# Patient Record
Sex: Female | Born: 1945
Health system: Southern US, Community
[De-identification: ages and names within clinical notes are randomized; demographics above are authoritative.]

## PROBLEM LIST (undated history)

## (undated) DIAGNOSIS — R0989 Other specified symptoms and signs involving the circulatory and respiratory systems: Secondary | ICD-10-CM

## (undated) DIAGNOSIS — R0602 Shortness of breath: Secondary | ICD-10-CM

## (undated) DIAGNOSIS — I499 Cardiac arrhythmia, unspecified: Secondary | ICD-10-CM

## (undated) DIAGNOSIS — R5383 Other fatigue: Secondary | ICD-10-CM

## (undated) DIAGNOSIS — K828 Other specified diseases of gallbladder: Secondary | ICD-10-CM

## (undated) DIAGNOSIS — K219 Gastro-esophageal reflux disease without esophagitis: Secondary | ICD-10-CM

## (undated) DIAGNOSIS — J45909 Unspecified asthma, uncomplicated: Secondary | ICD-10-CM

## (undated) DIAGNOSIS — F32A Depression, unspecified: Secondary | ICD-10-CM

## (undated) DIAGNOSIS — F329 Major depressive disorder, single episode, unspecified: Secondary | ICD-10-CM

## (undated) DIAGNOSIS — R5381 Other malaise: Secondary | ICD-10-CM

## (undated) DIAGNOSIS — R011 Cardiac murmur, unspecified: Secondary | ICD-10-CM

## (undated) DIAGNOSIS — M1990 Unspecified osteoarthritis, unspecified site: Secondary | ICD-10-CM

## (undated) DIAGNOSIS — K5792 Diverticulitis of intestine, part unspecified, without perforation or abscess without bleeding: Secondary | ICD-10-CM

## (undated) DIAGNOSIS — R55 Syncope and collapse: Secondary | ICD-10-CM

## (undated) DIAGNOSIS — D638 Anemia in other chronic diseases classified elsewhere: Secondary | ICD-10-CM

## (undated) DIAGNOSIS — K649 Unspecified hemorrhoids: Secondary | ICD-10-CM

## (undated) DIAGNOSIS — I480 Paroxysmal atrial fibrillation: Secondary | ICD-10-CM

## (undated) DIAGNOSIS — I4891 Unspecified atrial fibrillation: Secondary | ICD-10-CM

## (undated) DIAGNOSIS — K269 Duodenal ulcer, unspecified as acute or chronic, without hemorrhage or perforation: Secondary | ICD-10-CM

## (undated) DIAGNOSIS — I10 Essential (primary) hypertension: Secondary | ICD-10-CM

## (undated) DIAGNOSIS — G4730 Sleep apnea, unspecified: Secondary | ICD-10-CM

## (undated) DIAGNOSIS — Z9289 Personal history of other medical treatment: Secondary | ICD-10-CM

## (undated) DIAGNOSIS — N289 Disorder of kidney and ureter, unspecified: Secondary | ICD-10-CM

## (undated) DIAGNOSIS — K922 Gastrointestinal hemorrhage, unspecified: Secondary | ICD-10-CM

## (undated) DIAGNOSIS — T8859XA Other complications of anesthesia, initial encounter: Secondary | ICD-10-CM

## (undated) DIAGNOSIS — N186 End stage renal disease: Secondary | ICD-10-CM

## (undated) DIAGNOSIS — D126 Benign neoplasm of colon, unspecified: Secondary | ICD-10-CM

## (undated) DIAGNOSIS — R519 Headache, unspecified: Secondary | ICD-10-CM

## (undated) DIAGNOSIS — T4145XA Adverse effect of unspecified anesthetic, initial encounter: Secondary | ICD-10-CM

## (undated) DIAGNOSIS — Q2730 Arteriovenous malformation, site unspecified: Secondary | ICD-10-CM

## (undated) DIAGNOSIS — I951 Orthostatic hypotension: Secondary | ICD-10-CM

## (undated) HISTORY — PX: KNEE ARTHROPLASTY: SHX992

## (undated) HISTORY — DX: Syncope and collapse: R55

## (undated) HISTORY — DX: Arteriovenous malformation, site unspecified: Q27.30

## (undated) HISTORY — DX: Other specified symptoms and signs involving the circulatory and respiratory systems: R09.89

## (undated) HISTORY — PX: TUBAL LIGATION: SHX77

## (undated) HISTORY — DX: Paroxysmal atrial fibrillation: I48.0

## (undated) HISTORY — DX: Other malaise: R53.83

## (undated) HISTORY — DX: Benign neoplasm of colon, unspecified: D12.6

## (undated) HISTORY — DX: Shortness of breath: R06.02

## (undated) HISTORY — PX: ESOPHAGOGASTRODUODENOSCOPY: SHX1529

## (undated) HISTORY — DX: Unspecified hemorrhoids: K64.9

## (undated) HISTORY — PX: AV FISTULA PLACEMENT: SHX1204

## (undated) HISTORY — DX: Orthostatic hypotension: I95.1

## (undated) HISTORY — DX: Other malaise: R53.81

## (undated) HISTORY — DX: Diverticulitis of intestine, part unspecified, without perforation or abscess without bleeding: K57.92

## (undated) HISTORY — DX: Other specified diseases of gallbladder: K82.8

## (undated) HISTORY — PX: COLONOSCOPY: SHX174

## (undated) HISTORY — DX: Anemia in other chronic diseases classified elsewhere: D63.8

## (undated) HISTORY — DX: Cardiac murmur, unspecified: R01.1

## (undated) HISTORY — PX: BACK SURGERY: SHX140

## (undated) HISTORY — DX: End stage renal disease: N18.6

## (undated) HISTORY — DX: Gastrointestinal hemorrhage, unspecified: K92.2

## (undated) HISTORY — DX: Duodenal ulcer, unspecified as acute or chronic, without hemorrhage or perforation: K26.9

---

## 2012-06-30 ENCOUNTER — Encounter (HOSPITAL_COMMUNITY): Payer: Self-pay | Admitting: Emergency Medicine

## 2012-06-30 ENCOUNTER — Inpatient Hospital Stay (HOSPITAL_COMMUNITY)
Admission: EM | Admit: 2012-06-30 | Discharge: 2012-07-02 | DRG: 378 | Disposition: A | Discharge status: Home / Self Care | Payer: Medicare Other | Attending: Internal Medicine | Admitting: Internal Medicine

## 2012-06-30 DIAGNOSIS — N039 Chronic nephritic syndrome with unspecified morphologic changes: Secondary | ICD-10-CM | POA: Diagnosis present

## 2012-06-30 DIAGNOSIS — N186 End stage renal disease: Secondary | ICD-10-CM | POA: Diagnosis present

## 2012-06-30 DIAGNOSIS — I4891 Unspecified atrial fibrillation: Secondary | ICD-10-CM | POA: Diagnosis present

## 2012-06-30 DIAGNOSIS — Z86010 Personal history of colonic polyps: Secondary | ICD-10-CM

## 2012-06-30 DIAGNOSIS — I129 Hypertensive chronic kidney disease with stage 1 through stage 4 chronic kidney disease, or unspecified chronic kidney disease: Secondary | ICD-10-CM | POA: Diagnosis present

## 2012-06-30 DIAGNOSIS — K921 Melena: Principal | ICD-10-CM | POA: Diagnosis present

## 2012-06-30 DIAGNOSIS — D62 Acute posthemorrhagic anemia: Secondary | ICD-10-CM

## 2012-06-30 DIAGNOSIS — N184 Chronic kidney disease, stage 4 (severe): Secondary | ICD-10-CM | POA: Diagnosis present

## 2012-06-30 DIAGNOSIS — D631 Anemia in chronic kidney disease: Secondary | ICD-10-CM | POA: Diagnosis present

## 2012-06-30 DIAGNOSIS — Z992 Dependence on renal dialysis: Secondary | ICD-10-CM | POA: Diagnosis present

## 2012-06-30 DIAGNOSIS — T45515A Adverse effect of anticoagulants, initial encounter: Secondary | ICD-10-CM | POA: Diagnosis present

## 2012-06-30 DIAGNOSIS — K922 Gastrointestinal hemorrhage, unspecified: Secondary | ICD-10-CM

## 2012-06-30 DIAGNOSIS — N390 Urinary tract infection, site not specified: Secondary | ICD-10-CM | POA: Diagnosis present

## 2012-06-30 DIAGNOSIS — I480 Paroxysmal atrial fibrillation: Secondary | ICD-10-CM | POA: Diagnosis present

## 2012-06-30 HISTORY — DX: Disorder of kidney and ureter, unspecified: N28.9

## 2012-06-30 HISTORY — DX: Unspecified atrial fibrillation: I48.91

## 2012-06-30 LAB — COMPREHENSIVE METABOLIC PANEL
ALT: 9 U/L (ref 0–35)
BUN: 59 mg/dL — ABNORMAL HIGH (ref 6–23)
CO2: 23 mEq/L (ref 19–32)
Calcium: 9 mg/dL (ref 8.4–10.5)
Creatinine, Ser: 4.65 mg/dL — ABNORMAL HIGH (ref 0.50–1.10)
GFR calc Af Amer: 10 mL/min — ABNORMAL LOW (ref >90)
GFR calc non Af Amer: 9 mL/min — ABNORMAL LOW (ref >90)
Glucose, Bld: 126 mg/dL — ABNORMAL HIGH (ref 70–99)
Sodium: 140 mEq/L (ref 135–145)

## 2012-06-30 LAB — URINALYSIS, ROUTINE W REFLEX MICROSCOPIC
Bilirubin Urine: NEGATIVE
Nitrite: NEGATIVE
Specific Gravity, Urine: 1.015 (ref 1.005–1.030)
Urobilinogen, UA: 0.2 mg/dL (ref 0.0–1.0)
pH: 5.5 (ref 5.0–8.0)

## 2012-06-30 LAB — CBC
HCT: 29.8 % — ABNORMAL LOW (ref 36.0–46.0)
Hemoglobin: 9.4 g/dL — ABNORMAL LOW (ref 12.0–15.0)
MCH: 28.1 pg (ref 26.0–34.0)
MCV: 89.2 fL (ref 78.0–100.0)
RBC: 3.34 MIL/uL — ABNORMAL LOW (ref 3.87–5.11)

## 2012-06-30 LAB — URINE MICROSCOPIC-ADD ON

## 2012-06-30 LAB — TYPE AND SCREEN
ABO/RH(D): O POS
Antibody Screen: NEGATIVE

## 2012-06-30 LAB — PROTIME-INR: Prothrombin Time: 43.3 seconds — ABNORMAL HIGH (ref 11.6–15.2)

## 2012-06-30 MED ORDER — DEXTROSE 5 % IV SOLN
1.0000 g | INTRAVENOUS | Status: DC
  Administered 2012-06-30: 1 g via INTRAVENOUS
  Filled 2012-06-30: qty 10

## 2012-06-30 NOTE — ED Notes (Signed)
Pt c/o blood in stool. Pt is on coumadin. Pt states "my bladder hurts when I make water." Pt states she has bright red blood in her stool off and on. Pt states symptoms started over the weekend with blood in her stool. Pt states she has had problems with her bladder for the last few months. Pt denies burning when urinating. Pt denies pain. Pt a/o x 3.

## 2012-06-30 NOTE — H&P (Addendum)
[facility] History and Physical  Jane [name] [id number] DOB: [birth year] DOA: 06/30/2012  Referring physician: Dr. Zenia Resides PCP: No primary provider on file.  Specialists: none  Chief Complaint: BRBPR  HPI: Jane [name] is a 66 y.o. female  With past medical history of chronic kidney disease stage IV, anemia with a hemoglobin around 9, atrial fibrillation on chronic Coumadin, hypertension that comes in for bright red blood per rectum 4 days prior to admission. She relates she's had a bloody bowel movement daily about 2-3 teaspoons of blood with her stools. She did not feel chest pain, shortness of breath, palpitation, leg pains, change in vision, no weakness, no lightheadedness. But symptoms were not improving she decided to come to the emergency room. Last colonoscopy was 4 years prior to this admission she and multiple polyps at this time. Mild dysuria with urination this started 3 days prior to admission.  Review of Systems: The patient denies anorexia, fever, weight loss,, vision loss, decreased hearing, hoarseness, chest pain, syncope, dyspnea on exertion, peripheral edema, balance deficits, hemoptysis, abdominal pain, melena, hematochezia, severe indigestion/heartburn, hematuria, incontinence, genital sores, muscle weakness, suspicious skin lesions, transient blindness, difficulty walking, depression, unusual weight change, abnormal bleeding, enlarged lymph nodes, angioedema, and breast masses.    Past Medical History  Diagnosis Date  . Atrial fibrillation   . Renal disorder    Past Surgical History  Procedure Date  . Knee arthroplasty   . Tubal ligation    Social History:  reports that she has never smoked. She does not have any smokeless tobacco history on file. She reports that she does not drink alcohol or use illicit drugs. She lives in [place] by herself can perform all her ADLs  No Known Allergies  Family History  Problem Relation Age of Onset  . Heart failure  Mother   . Stroke Mother   . Other Father     Prior to Admission medications   Medication Sig Start Date End Date Taking? Authorizing Provider  amiodarone (PACERONE) 200 MG tablet Take 200 mg by mouth daily.   Yes Historical Provider, MD  carvedilol (COREG) 25 MG tablet Take 50 mg by mouth 2 (two) times daily with a meal.   Yes Historical Provider, MD  cholecalciferol (VITAMIN D) 1000 UNITS tablet Take 1,000 Units by mouth daily.   Yes Historical Provider, MD  co-enzyme Q-10 30 MG capsule Take 100 mg by mouth daily.   Yes Historical Provider, MD  colchicine 0.6 MG tablet Take 0.6 mg by mouth daily as needed. gout   Yes Historical Provider, MD  ferrous sulfate 325 (65 FE) MG tablet Take 325 mg by mouth 2 (two) times daily.   Yes Historical Provider, MD  furosemide (LASIX) 20 MG tablet Take 20 mg by mouth 2 (two) times daily.   Yes Historical Provider, MD  pantoprazole (PROTONIX) 40 MG tablet Take 40 mg by mouth daily.   Yes Historical Provider, MD  spironolactone (ALDACTONE) 25 MG tablet Take 12.5 mg by mouth 2 (two) times daily.   Yes Historical Provider, MD  thiamine (VITAMIN B-1) 100 MG tablet Take 100 mg by mouth daily.   Yes Historical Provider, MD  vitamin C (ASCORBIC ACID) 500 MG tablet Take 500 mg by mouth daily.   Yes Historical Provider, MD  warfarin (COUMADIN) 5 MG tablet Take 5 mg by mouth daily.   Yes Historical Provider, MD   Physical Exam: Filed Vitals:   06/30/12 2215 06/30/12 2257 06/30/12 2302 06/30/12 2305  BP:  161/50  172/57 166/60  Pulse: 66 72 65 60  Temp:      TempSrc:      Resp: 17     SpO2: 98%        General:  Awake alert and oriented x3 in no acute distress  Eyes: Anicteric no pallor  ENT: Moist mucous membranes  Neck: No JVD  Cardiovascular: Irregular rate and rhythm with positive S1 and S2 no appreciated murmurs rubs gallops  Respiratory: Good air movement clear to auscultation  Abdomen: Positive bowel sounds nontender nondistended and  soft  Skin: No rashes ulcerations  Musculoskeletal: Intact  Psychiatric: Appropriate  Neurologic: Nonfocal  Labs on Admission:  Basic Metabolic Panel:  Lab [id number] 2130  NA 140  K 4.5  CL 105  CO2 23  GLUCOSE 126*  BUN 59*  CREATININE 4.65*  CALCIUM 9.0  MG --  PHOS --   Liver Function Tests:  Lab 06/30/12 2130  AST 13  ALT 9  ALKPHOS 123*  BILITOT 0.2*  PROT 7.5  ALBUMIN 3.4*   No results found for this basename: LIPASE:5,AMYLASE:5 in the last 168 hours No results found for this basename: AMMONIA:5 in the last 168 hours CBC:  Lab 06/30/12 2130  WBC 5.5  NEUTROABS --  HGB 9.4*  HCT 29.8*  MCV 89.2  PLT 191   Cardiac Enzymes: No results found for this basename: CKTOTAL:5,CKMB:5,CKMBINDEX:5,TROPONINI:5 in the last 168 hours  BNP (last 3 results) No results found for this basename: PROBNP:3 in the last 8760 hours CBG: No results found for this basename: GLUCAP:5 in the last 168 hours  Radiological Exams on Admission: No results found.  EKG: Sinus rhythm with a first degree AV block left axis deviation no T wave inversion  Assessment/Plan Principal Problem: Acute lower GI bleeding: - This most likely secondary to a supratherapeutic INR, she does not have a history of diverticulosis, she had a colonoscopy 4 years prior to admission at that time she had multiple polyps removed. She's not having any tenesmus. She relates she does have hemorrhoids and has been using Preparation H. I will continue to check her CBC every 12 hours, will type and screen and if patient starts feeling symptomatic her hemoglobin drops below 7 we'll go ahead and transfuse. She does not seem dehydrated by physical exam, she does not describe any symptoms of orthostasis or weakness.  - She's not actively bleeding at this time, we'll left her INR drift down. If she continues to have further episodes of bleeding or her hemoglobin continues to drop we'll have to reverse. Her CHADS score  is between 3 and 4. Will also have to call GI if her she continues to have ongoing bleeding.  - Place 2 large bore IV lines, we'll give Lasix after she gets E. unit of blood that she does have chronic kidney disease stage IV.  - She continues to have further episodes of bleeding we'll consult GI. We'll put her on a clear liquid diet and hold her Coumadin.  - INR to be followed by pharmacy  UTI: - U/A showed 21-50 white blood cells, many bacteria, she is complaining of some mild dysuria with urination. I agree with continuing Rocephin, urine cultures were sent. This might affect her INR drifting down. We'll check an INR tomorrow and give her vitamin K. Orally.  Chronic kidney disease, stage IV (severe) -At baseline as per patient, we'll continue her Lasix as prolactin check a basic metabolic panel in the morning. IV fluids and saline lock.  Atrial fibrillation - Currently in sinus rhythm, and rate control. Will hold her Coumadin. We'll leave her INR drift down, closer to 2. If she continues to have further episodes of bleeding we'll have to reverse.  Hypertension: - Good control continue current home meds. No changes were made.   Code Status: Full code Family Communication: daughter Disposition Plan: home in 2-3 days  Time spent: 54 mintes  FELIZ Marguarite Arbour [facility] Pager [phone]  If 7PM-7AM, please contact night-coverage [email or web address] Password [facility] 06/30/2012, 11:34 PM

## 2012-06-30 NOTE — ED Provider Notes (Signed)
History     CSN: [id number]  Arrival date & time 06/30/12  2043   First MD Initiated Contact with Patient 06/30/12 2147      Chief Complaint  Patient presents with  . Rectal Bleeding    (Consider location/radiation/quality/duration/timing/severity/associated sxs/prior treatment) Patient is a 66 y.o. female presenting with hematochezia. The history is provided by the patient.  Rectal Bleeding    patient here complaining of blood mixed in her stool x2 days. Denies any syncope or near-syncope. She is on Coumadin due to her A. fib. Denies any fever or vomiting. Patient also has a history of stage IV kidney disease being treated by her Dr. Gerald Dexter. She denies been lightheaded or dizzy. Nothing makes her symptoms better worse and no treatment used prior to arrival. Denies any bruising at this time her skin.  Past Medical History  Diagnosis Date  . Atrial fibrillation   . Renal disorder     No past surgical history on file.  No family history on file.  History  Substance Use Topics  . Smoking status: Not on file  . Smokeless tobacco: Not on file  . Alcohol Use:     OB History    Grav Para Term Preterm Abortions TAB SAB Ect Mult Living                  Review of Systems  Gastrointestinal: Positive for hematochezia.  All other systems reviewed and are negative.    Allergies  Review of patient's allergies indicates no known allergies.  Home Medications   Current Outpatient Rx  Name  Route  Sig  Dispense  Refill  . AMIODARONE HCL 200 MG PO TABS   Oral   Take 200 mg by mouth daily.         Marland Kitchen CARVEDILOL 25 MG PO TABS   Oral   Take 50 mg by mouth 2 (two) times daily with a meal.         . VITAMIN D 1000 UNITS PO TABS   Oral   Take 1,000 Units by mouth daily.         Marland Kitchen COENZYME Q10 30 MG PO CAPS   Oral   Take 100 mg by mouth daily.         . COLCHICINE 0.6 MG PO TABS   Oral   Take 0.6 mg by mouth daily as needed. gout         . FERROUS  SULFATE 325 (65 FE) MG PO TABS   Oral   Take 325 mg by mouth 2 (two) times daily.         . FUROSEMIDE 20 MG PO TABS   Oral   Take 20 mg by mouth 2 (two) times daily.         Marland Kitchen PANTOPRAZOLE SODIUM 40 MG PO TBEC   Oral   Take 40 mg by mouth daily.         Marland Kitchen SPIRONOLACTONE 25 MG PO TABS   Oral   Take 12.5 mg by mouth 2 (two) times daily.         Marland Kitchen VITAMIN B-1 100 MG PO TABS   Oral   Take 100 mg by mouth daily.         Marland Kitchen VITAMIN C 500 MG PO TABS   Oral   Take 500 mg by mouth daily.         . WARFARIN SODIUM 5 MG PO TABS   Oral   Take 5 mg  by mouth daily.           BP 175/56  Pulse 63  Temp 97.9 F (36.6 C) (Oral)  Resp 16  SpO2 98%  Physical Exam  Nursing note and vitals reviewed. Constitutional: She is oriented to person, place, and time. She appears well-developed and well-nourished.  Non-toxic appearance. No distress.  HENT:  Head: Normocephalic and atraumatic.  Eyes: Conjunctivae normal, EOM and lids are normal. Pupils are equal, round, and reactive to light.  Neck: Normal range of motion. Neck supple. No tracheal deviation present. No mass present.  Cardiovascular: Normal rate, regular rhythm and normal heart sounds.  Exam reveals no gallop.   No murmur heard. Pulmonary/Chest: Effort normal and breath sounds normal. No stridor. No respiratory distress. She has no decreased breath sounds. She has no wheezes. She has no rhonchi. She has no rales.  Abdominal: Soft. Normal appearance and bowel sounds are normal. She exhibits no distension. There is no tenderness. There is no rigidity, no rebound, no guarding and no CVA tenderness.  Genitourinary:       Green stool appreciated on rectal exam no blood or hemorrhage noted  Musculoskeletal: Normal range of motion. She exhibits no edema and no tenderness.  Neurological: She is alert and oriented to person, place, and time. She has normal strength. No cranial nerve deficit or sensory deficit. GCS eye subscore  is 4. GCS verbal subscore is 5. GCS motor subscore is 6.  Skin: Skin is warm and dry. No abrasion and no rash noted.  Psychiatric: She has a normal mood and affect. Her speech is normal and behavior is normal.    ED Course  Procedures (including critical care time)   Labs Reviewed  CBC  COMPREHENSIVE METABOLIC PANEL  PROTIME-INR  URINALYSIS, ROUTINE W REFLEX MICROSCOPIC  TYPE AND SCREEN  URINE CULTURE   No results found.   No diagnosis found.    MDM  Patient does have known stage IV kidney disease and her creatinine today is 4.65. She is unsure of her baseline is. Her INR is 5 and this is abnormal for her. She does have a UTI. The plan is for her to be observed overnight they'll contact the hospitalist        Leota Jacobsen, MD 06/30/12 2259

## 2012-06-30 NOTE — ED Notes (Signed)
MD at bedside. 

## 2012-07-01 LAB — CBC
HCT: 27.4 % — ABNORMAL LOW (ref 36.0–46.0)
HCT: 28.6 % — ABNORMAL LOW (ref 36.0–46.0)
HCT: 28 % — ABNORMAL LOW (ref 36.0–46.0)
Hemoglobin: 9.1 g/dL — ABNORMAL LOW (ref 12.0–15.0)
MCH: 28.3 pg (ref 26.0–34.0)
MCHC: 32.5 g/dL (ref 30.0–36.0)
MCV: 89 fL (ref 78.0–100.0)
MCV: 88 fL (ref 78.0–100.0)
RBC: 3.08 MIL/uL — ABNORMAL LOW (ref 3.87–5.11)
RBC: 3.25 MIL/uL — ABNORMAL LOW (ref 3.87–5.11)
RDW: 14.8 % (ref 11.5–15.5)
WBC: 6.3 10*3/uL (ref 4.0–10.5)
WBC: 4.8 10*3/uL (ref 4.0–10.5)

## 2012-07-01 LAB — COMPREHENSIVE METABOLIC PANEL
AST: 11 U/L (ref 0–37)
Albumin: 3.1 g/dL — ABNORMAL LOW (ref 3.5–5.2)
Alkaline Phosphatase: 112 U/L (ref 39–117)
BUN: 57 mg/dL — ABNORMAL HIGH (ref 6–23)
Potassium: 4.2 mEq/L (ref 3.5–5.1)
Total Protein: 6.8 g/dL (ref 6.0–8.3)

## 2012-07-01 MED ORDER — ONDANSETRON HCL 4 MG/2ML IJ SOLN: 4.0000 mg | Freq: Four times a day (QID) | INTRAMUSCULAR | Status: DC | PRN

## 2012-07-01 MED ORDER — PHYTONADIONE 1 MG/0.5 ML ORAL SOLUTION
1.0000 mg | Freq: Once | ORAL | Status: AC
  Administered 2012-07-01: 1 mg via ORAL
  Filled 2012-07-01: qty 0.5

## 2012-07-01 MED ORDER — ACETAMINOPHEN 325 MG PO TABS: 650.0000 mg | ORAL_TABLET | Freq: Four times a day (QID) | ORAL | Status: DC | PRN

## 2012-07-01 MED ORDER — SODIUM CHLORIDE 0.9 % IJ SOLN
3.0000 mL | Freq: Two times a day (BID) | INTRAMUSCULAR | Status: DC
  Administered 2012-07-01 – 2012-07-02 (×4): 3 mL via INTRAVENOUS

## 2012-07-01 MED ORDER — PANTOPRAZOLE SODIUM 40 MG PO TBEC
40.0000 mg | DELAYED_RELEASE_TABLET | Freq: Every day | ORAL | Status: DC
  Administered 2012-07-01 – 2012-07-02 (×2): 40 mg via ORAL
  Filled 2012-07-01 (×2): qty 1

## 2012-07-01 MED ORDER — VITAMIN C 500 MG PO TABS
500.0000 mg | ORAL_TABLET | Freq: Every day | ORAL | Status: DC
  Administered 2012-07-01 – 2012-07-02 (×2): 500 mg via ORAL
  Filled 2012-07-01 (×2): qty 1

## 2012-07-01 MED ORDER — SODIUM CHLORIDE 0.9 % IJ SOLN: 3.0000 mL | INTRAMUSCULAR | Status: DC | PRN

## 2012-07-01 MED ORDER — VITAMIN B-1 100 MG PO TABS
100.0000 mg | ORAL_TABLET | Freq: Every day | ORAL | Status: DC
  Administered 2012-07-01 – 2012-07-02 (×2): 100 mg via ORAL
  Filled 2012-07-01 (×2): qty 1

## 2012-07-01 MED ORDER — VITAMIN D3 25 MCG (1000 UNIT) PO TABS
1000.0000 [IU] | ORAL_TABLET | Freq: Every day | ORAL | Status: DC
  Administered 2012-07-01 – 2012-07-02 (×2): 1000 [IU] via ORAL
  Filled 2012-07-01 (×2): qty 1

## 2012-07-01 MED ORDER — CARVEDILOL 25 MG PO TABS
50.0000 mg | ORAL_TABLET | Freq: Two times a day (BID) | ORAL | Status: DC
  Administered 2012-07-01 – 2012-07-02 (×4): 50 mg via ORAL
  Filled 2012-07-01 (×6): qty 2

## 2012-07-01 MED ORDER — SODIUM CHLORIDE 0.9 % IV SOLN: 250.0000 mL | INTRAVENOUS | Status: DC | PRN

## 2012-07-01 MED ORDER — AMIODARONE HCL 200 MG PO TABS
200.0000 mg | ORAL_TABLET | Freq: Every day | ORAL | Status: DC
  Administered 2012-07-01 – 2012-07-02 (×2): 200 mg via ORAL
  Filled 2012-07-01 (×2): qty 1

## 2012-07-01 MED ORDER — CEFTRIAXONE SODIUM 1 G IJ SOLR
1.0000 g | Freq: Every day | INTRAMUSCULAR | Status: DC
  Administered 2012-07-01: 1 g via INTRAVENOUS
  Filled 2012-07-01 (×2): qty 10

## 2012-07-01 MED ORDER — FUROSEMIDE 20 MG PO TABS
20.0000 mg | ORAL_TABLET | Freq: Two times a day (BID) | ORAL | Status: DC
  Administered 2012-07-01 – 2012-07-02 (×4): 20 mg via ORAL
  Filled 2012-07-01 (×6): qty 1

## 2012-07-01 MED ORDER — ACETAMINOPHEN 650 MG RE SUPP: 650.0000 mg | Freq: Four times a day (QID) | RECTAL | Status: DC | PRN

## 2012-07-01 MED ORDER — ONDANSETRON HCL 4 MG PO TABS: 4.0000 mg | ORAL_TABLET | Freq: Four times a day (QID) | ORAL | Status: DC | PRN

## 2012-07-01 MED ORDER — SPIRONOLACTONE 12.5 MG HALF TABLET
12.5000 mg | ORAL_TABLET | Freq: Two times a day (BID) | ORAL | Status: DC
  Administered 2012-07-01 – 2012-07-02 (×3): 12.5 mg via ORAL
  Filled 2012-07-01 (×4): qty 1

## 2012-07-01 MED ORDER — POLYETHYLENE GLYCOL 3350 17 G PO PACK
17.0000 g | PACK | Freq: Every day | ORAL | Status: DC | PRN
  Filled 2012-07-01: qty 1

## 2012-07-01 MED ORDER — FERROUS SULFATE 325 (65 FE) MG PO TABS
325.0000 mg | ORAL_TABLET | Freq: Two times a day (BID) | ORAL | Status: DC
  Administered 2012-07-01 – 2012-07-02 (×4): 325 mg via ORAL
  Filled 2012-07-01 (×5): qty 1

## 2012-07-01 NOTE — Progress Notes (Addendum)
CRITICAL VALUE ALERT  Critical value received:  INR 5.02  Date of notification:  07/01/2012   Time of notification:  [id number]  Critical value read back: yes  Nurse who received alert:  Krista Blue, RN  MD notified (1st page):  Tylene Fantasia, NP  Time of first page:  [phone]  MD notified (2nd page):   Time of second page:  Responding MD:  Tylene Fantasia, NP  Time MD responded:  0502  No orders given to reverse INR. Admitting MD wanted to let INR trend down and monitor pt for bloody stools. Will continue to monitor.  At 0508: Tylene Fantasia placed orders to check CBC's at 1200 and 2000.

## 2012-07-01 NOTE — Progress Notes (Signed)
[facility] PROGRESS NOTE  Jane [name] [id number] DOB: Nov 08, 1945 DOA: 06/30/2012 PCP: No primary provider on file.  Assessment/Plan: 1.  Acute lower GI bleeding:  - This most likely secondary to a supratherapeutic INR, she does not have a history of diverticulosis, she had a colonoscopy 4 years prior to admission at that time she had multiple polyps removed. She's not having any tenesmus. She relates she does have hemorrhoids and has been using Preparation H. I will continue to check her CBC every 12 hours, will type and screen and if patient starts feeling symptomatic her hemoglobin drops below 7 we'll go ahead and transfuse. She does not seem dehydrated by physical exam, she does not describe any symptoms of orthostasis or weakness.  - She's not actively bleeding at this time, we'll left her INR drift down. If she continues to have further episodes of bleeding or her hemoglobin continues to drop we'll have to reverse. Her CHADS score is between 3 and 4.   - will call GI if her hemoglobin drops and she has continues active bleeding with INR TRENDING DOWN.  - Place 2 large bore IV lines, we'll give Lasix after she gets E. unit of blood that she does have chronic kidney disease stage IV.  - She continues to have further episodes of bleeding we'll consult GI. We'll put her on a clear liquid diet and hold her Coumadin.  - INR to be followed by pharmacy   UTI:  - U/A showed 21-50 white blood cells, many bacteria, she is complaining of some mild dysuria with urination. I agree with continuing Rocephin, urine cultures were sent. This might affect her INR drifting down. We'll check an INR tomorrow and give her vitamin K. Orally.  Chronic kidney disease, stage IV (severe)  -At baseline as per patient, we'll continue her Lasix as prolactin check a basic metabolic panel in the morning. IV fluids and saline lock.  Atrial fibrillation  - Currently in sinus rhythm, and rate control. Will hold her  Coumadin. We'll leave her INR drift down, closer to 2. If she continues to have further episodes of bleeding we'll have to reverse.  Hypertension:  - Good control continue current home meds. No changes were made.  Code Status: full code.  Family Communication: daughter at bedside Disposition Plan: possibly on saturday      HPI/Subjective: COMFORTABLE. IS anxious about going home.   Objective: Filed Vitals:   07/01/12 0147 07/01/12 0638 07/01/12 1449 07/01/12 1704  BP: 143/52 133/59 126/38 146/47  Pulse: 63 65 58 60  Temp: 97.6 F (36.4 C) 97.8 F (36.6 C) 97.7 F (36.5 C)   TempSrc: Oral Oral Oral   Resp: 20 20 18    Height:      Weight:      SpO2: 98% 97% 100%     Intake/Output Summary (Last 24 hours) at 07/01/12 1942 Last data filed at 07/01/12 1452  Gross per 24 hour  Intake    480 ml  Output   1001 ml  Net   -521 ml   Filed Weights   07/01/12 0120  Weight: 127 kg (279 lb 15.8 oz)    Exam:   General:  Alert afebrile comfortable  Cardiovascular: s1s2  Respiratory: CTAB  Abdomen: soft NT ND BS+  Data Reviewed: Basic Metabolic Panel:  Lab [id number] 0404 06/30/12 2130  NA 140 140  K 4.2 4.5  CL 105 105  CO2 24 23  GLUCOSE 107* 126*  BUN 57* 59*  CREATININE  4.50* 4.65*  CALCIUM 8.9 9.0  MG -- --  PHOS -- --   Liver Function Tests:  Lab 07/01/12 0404 06/30/12 2130  AST 11 13  ALT 8 9  ALKPHOS 112 123*  BILITOT 0.2* 0.2*  PROT 6.8 7.5  ALBUMIN 3.1* 3.4*   No results found for this basename: LIPASE:5,AMYLASE:5 in the last 168 hours No results found for this basename: AMMONIA:5 in the last 168 hours CBC:  Lab 07/01/12 1911 07/01/12 1118 07/01/12 0404 06/30/12 2130  WBC 5.2 4.8 6.3 5.5  NEUTROABS -- -- -- --  HGB 9.1* 9.4* 8.6* 9.4*  HCT 28.0* 28.6* 27.4* 29.8*  MCV 87.2 88.0 89.0 89.2  PLT 192 199 185 191   Cardiac Enzymes: No results found for this basename: CKTOTAL:5,CKMB:5,CKMBINDEX:5,TROPONINI:5 in the last 168 hours BNP (last 3  results) No results found for this basename: PROBNP:3 in the last 8760 hours CBG: No results found for this basename: GLUCAP:5 in the last 168 hours  No results found for this or any previous visit (from the past 240 hour(s)).   Studies: No results found.  Scheduled Meds:   . amiodarone  200 mg Oral Daily  . carvedilol  50 mg Oral BID WC  . cefTRIAXone (ROCEPHIN)  IV  1 g Intravenous QHS  . cholecalciferol  1,000 Units Oral Daily  . ferrous sulfate  325 mg Oral BID  . furosemide  20 mg Oral BID  . pantoprazole  40 mg Oral Daily  . sodium chloride  3 mL Intravenous Q12H  . spironolactone  12.5 mg Oral BID  . thiamine  100 mg Oral Daily  . vitamin C  500 mg Oral Daily   Continuous Infusions:   Principal Problem:  *Acute lower GI bleeding Active Problems:  Chronic kidney disease, stage IV (severe)  Atrial fibrillation  Hypertension  UTI (lower urinary tract infection)        [name]  [facility] Pager [phone] If 8PM-8AM, please contact night-coverage at [email or web address], password [facility] 07/01/2012, 7:42 PM  LOS: 1 day

## 2012-07-02 DIAGNOSIS — D62 Acute posthemorrhagic anemia: Secondary | ICD-10-CM

## 2012-07-02 DIAGNOSIS — Z86010 Personal history of colonic polyps: Secondary | ICD-10-CM

## 2012-07-02 LAB — CBC
HCT: 28.3 % — ABNORMAL LOW (ref 36.0–46.0)
MCV: 88.2 fL (ref 78.0–100.0)
Platelets: 186 10*3/uL (ref 150–400)
RBC: 3.21 MIL/uL — ABNORMAL LOW (ref 3.87–5.11)
RDW: 14.4 % (ref 11.5–15.5)
WBC: 4.5 10*3/uL (ref 4.0–10.5)

## 2012-07-02 LAB — URINE CULTURE

## 2012-07-02 LAB — HEMOGLOBIN AND HEMATOCRIT, BLOOD: HCT: 33.2 % — ABNORMAL LOW (ref 36.0–46.0)

## 2012-07-02 MED ORDER — CEPHALEXIN 500 MG PO CAPS: 500.0000 mg | ORAL_CAPSULE | Freq: Two times a day (BID) | ORAL | Status: DC

## 2012-07-02 MED ORDER — WARFARIN SODIUM 5 MG PO TABS: 5.0000 mg | ORAL_TABLET | Freq: Every day | ORAL | Status: DC

## 2012-07-02 NOTE — Consult Note (Signed)
Referring Provider: No ref. provider found Primary Care Physician:  No primary provider on file. Primary Gastroenterologist:  Benna Dunks is from [place]  Reason for Consultation:  GIB  HPI: Jane [name] is a 66 y.o. female with past medical history of chronic kidney disease stage IV, anemia with a baseline hemoglobin around 9, atrial fibrillation on chronic Coumadin, and hypertension who comes in for bright red blood per rectum 4 days prior to admission. She is here visiting from [place].  She states that she's had a bloody bowel movement daily about 2-3 teaspoons of blood with her stools since last Saturday. She did not feel chest pain, shortness of breath, palpitation, leg pains, change in vision, no weakness, no lightheadedness, but did have some cramping discomfort as if she were to need to move her bowels.  Says that she was constipated for a few days prior to the bleeding.  Symptoms were not resolving so she came to the ED on 12/11.  INR was 5.02 and Hgb was 9.4 grams.  Her baseline Hgb is in the 9 gram range according to her report and she takes iron 325 mg BID at home.  Her INR was left to reverse and is down to 2.24 today.  Hgb has held stable and she has not required transfusion.  She has not seen any blood since yesterday AM.  Had a small bowel movement today without blood.  Last colonoscopy was 4 years prior to this admission she and multiple polyps at this time.  This was obviously performed in [place].  She does not recall being told that she had diverticulosis at that time.   Past Medical History  Diagnosis Date  . Atrial fibrillation   . Renal disorder     Past Surgical History  Procedure Date  . Knee arthroplasty   . Tubal ligation     Prior to Admission medications   Medication Sig Start Date End Date Taking? Authorizing Provider  amiodarone (PACERONE) 200 MG tablet Take 200 mg by mouth daily.   Yes Historical Provider, MD  carvedilol (COREG) 25 MG tablet  Take 50 mg by mouth 2 (two) times daily with a meal.   Yes Historical Provider, MD  cholecalciferol (VITAMIN D) 1000 UNITS tablet Take 1,000 Units by mouth daily.   Yes Historical Provider, MD  co-enzyme Q-10 30 MG capsule Take 100 mg by mouth daily.   Yes Historical Provider, MD  colchicine 0.6 MG tablet Take 0.6 mg by mouth daily as needed. gout   Yes Historical Provider, MD  ferrous sulfate 325 (65 FE) MG tablet Take 325 mg by mouth 2 (two) times daily.   Yes Historical Provider, MD  furosemide (LASIX) 20 MG tablet Take 20 mg by mouth 2 (two) times daily.   Yes Historical Provider, MD  pantoprazole (PROTONIX) 40 MG tablet Take 40 mg by mouth daily.   Yes Historical Provider, MD  spironolactone (ALDACTONE) 25 MG tablet Take 12.5 mg by mouth 2 (two) times daily.   Yes Historical Provider, MD  thiamine (VITAMIN B-1) 100 MG tablet Take 100 mg by mouth daily.   Yes Historical Provider, MD  vitamin C (ASCORBIC ACID) 500 MG tablet Take 500 mg by mouth daily.   Yes Historical Provider, MD  warfarin (COUMADIN) 5 MG tablet Take 5 mg by mouth daily.   Yes Historical Provider, MD    Current Facility-Administered Medications  Medication Dose Route Frequency Provider Last Rate Last Dose  . 0.9 %  sodium chloride infusion  250 mL  Intravenous PRN Charlynne Cousins, MD      . acetaminophen (TYLENOL) tablet 650 mg  650 mg Oral Q6H PRN Charlynne Cousins, MD       Or  . acetaminophen (TYLENOL) suppository 650 mg  650 mg Rectal Q6H PRN Charlynne Cousins, MD      . amiodarone (PACERONE) tablet 200 mg  200 mg Oral Daily Charlynne Cousins, MD   200 mg at 07/02/12 1125  . carvedilol (COREG) tablet 50 mg  50 mg Oral BID WC Charlynne Cousins, MD   50 mg at 07/02/12 0849  . cefTRIAXone (ROCEPHIN) 1 g in dextrose 5 % 50 mL IVPB  1 g Intravenous QHS Charlynne Cousins, MD   1 g at 07/01/12 2109  . cholecalciferol (VITAMIN D) tablet 1,000 Units  1,000 Units Oral Daily Charlynne Cousins, MD   1,000 Units at  07/02/12 1125  . ferrous sulfate tablet 325 mg  325 mg Oral BID Charlynne Cousins, MD   325 mg at 07/02/12 1125  . furosemide (LASIX) tablet 20 mg  20 mg Oral BID Charlynne Cousins, MD   20 mg at 07/02/12 0849  . ondansetron (ZOFRAN) tablet 4 mg  4 mg Oral Q6H PRN Charlynne Cousins, MD       Or  . ondansetron [facility]) injection 4 mg  4 mg Intravenous Q6H PRN Charlynne Cousins, MD      . pantoprazole (PROTONIX) EC tablet 40 mg  40 mg Oral Daily Charlynne Cousins, MD   40 mg at 07/02/12 1125  . polyethylene glycol (MIRALAX / GLYCOLAX) packet 17 g  17 g Oral Daily PRN Charlynne Cousins, MD      . sodium chloride 0.9 % injection 3 mL  3 mL Intravenous Q12H Charlynne Cousins, MD   3 mL at 07/02/12 1127  . sodium chloride 0.9 % injection 3 mL  3 mL Intravenous PRN Charlynne Cousins, MD      . spironolactone (ALDACTONE) tablet 12.5 mg  12.5 mg Oral BID Charlynne Cousins, MD   12.5 mg at 07/02/12 1125  . thiamine (VITAMIN B-1) tablet 100 mg  100 mg Oral Daily Charlynne Cousins, MD   100 mg at 07/02/12 1125  . vitamin C (ASCORBIC ACID) tablet 500 mg  500 mg Oral Daily Charlynne Cousins, MD   500 mg at 07/02/12 1125    Allergies as of 06/30/2012  . (No Known Allergies)    Family History  Problem Relation Age of Onset  . Heart failure Mother   . Stroke Mother   . Other Father     History   Social History  . Marital Status: Single    Spouse Name: N/A    Number of Children: N/A  . Years of Education: N/A   Occupational History  . Not on file.   Social History Main Topics  . Smoking status: Never Smoker   . Smokeless tobacco: Not on file  . Alcohol Use: No  . Drug Use: No  . Sexually Active: No   Other Topics Concern  . Not on file   Social History Narrative  . No narrative on file    Review of Systems: Ten point ROS is O/W negative except as mentioned in HPI.  Physical Exam: Vital signs in last 24 hours: Temp:  [97.7 F (36.5 C)-98.2 F (36.8 C)]  98.2 F (36.8 C) (12/13 0543) Pulse Rate:  [58-66] 60  (12/13 0849)  Resp:  [18-20] 20  (12/13 0543) BP: (105-146)/(38-55) 132/55 mmHg (12/13 0849) SpO2:  [95 %-100 %] 95 % (12/13 0543) Last BM Date: 07/01/12 General:   Alert, Well-developed, well-nourished, pleasant and cooperative in NAD Head:  Normocephalic and atraumatic. Eyes:  Sclera clear, no icterus.  Conjunctiva pink. Ears:  Normal auditory acuity. Mouth:  No deformity or lesions.   Lungs:  Clear throughout to auscultation.  No wheezes, crackles, or rhonchi.  Heart:  Regular rate and rhythm; no murmurs, clicks, rubs,  or gallops. Abdomen:  Soft, obese, non-distended, nontender, BS active, nonpalp mass or hsm.  Low vertical scar noted on abdomen.   Rectal:  Deferred.  Msk:  Symmetrical without gross deformities. Pulses:  Normal pulses noted. Extremities:  Trace edema in B/L LE's. Neurologic:  Alert and  oriented x4;  grossly normal neurologically. Skin:  Intact without significant lesions or rashes.. Psych:  Alert and cooperative. Normal mood and affect.  Intake/Output from previous day: 12/12 0701 - 12/13 0700 In: 861 [P.O.:720; I.V.:141] Out: 2701 [Urine:2700; Stool:1]  Lab Results:  Basename 07/02/12 1005 07/01/12 1911 07/01/12 1118  WBC 4.5 5.2 4.8  HGB 9.1* 9.1* 9.4*  HCT 28.3* 28.0* 28.6*  PLT 186 192 199   BMET  Basename 07/01/12 0404 06/30/12 2130  NA 140 140  K 4.2 4.5  CL 105 105  CO2 24 23  GLUCOSE 107* 126*  BUN 57* 59*  CREATININE 4.50* 4.65*  CALCIUM 8.9 9.0   LFT  Basename 07/01/12 0404  PROT 6.8  ALBUMIN 3.1*  AST 11  ALT 8  ALKPHOS 112  BILITOT 0.2*  BILIDIR --  IBILI --   PT/INR  Basename 07/02/12 1005 07/01/12 1910  LABPROT 23.8* 28.8*  INR 2.24* 2.90*    IMPRESSION:  -GIB:  Likely lower in origin.  Possibly diverticular vs hemorrhoidal.  This occurred in the setting of supratherapeutic INR.  Bleeding seems to have stopped. -Anemia, normocytic:  Chronic secondary to kidney  disease.  Hgb has been stable. -CKD stage IV -Atrial fibrillation:  On chronic coumadin. -Supratherapeutic INR:  Drifting down well.   PLAN: -Monitor Hgb and transfuse if needed. -Likely will plan to observe on diet.  Has been modified to low fiber/low residue for now. -Will need follow-up with her GI in [place] when she returns.   ZEHR, JESSICA D.  07/02/2012, 1:33 PM  Pager number [phone]  GI ATTENDING  History, chart, laboratories reviewed. History and physical exam as above. Case discussed with physician assistant. Patient with hemodynamically stable lower GI bleeding, on Coumadin. Most consistent with benign anorectal pathology. Bleeding has ceased. Normal bowel movements today. The primary team is planning on sending her home. She's anticipating air travel back to [place] and has followup with her GI doctor on Monday. We're available as needed. Thanks  Docia Chuck. Geri Seminole., M.D. [facility] Division of Gastroenterology

## 2012-07-18 NOTE — Discharge Summary (Signed)
Physician Discharge Summary  Jane [name] [id number] DOB: 1945-09-26 DOA: 06/30/2012  PCP: No primary provider on file.  Admit date: 06/30/2012 Discharge date: 07/18/2012    Recommendations for Outpatient Follow-up:  1. Follow up with GI tomorrow  Discharge Diagnoses:  Principal Problem:  *Acute lower GI bleeding Active Problems:  Chronic kidney disease, stage IV (severe)  Atrial fibrillation  Hypertension  UTI (lower urinary tract infection)   Discharge Condition: stable  Diet recommendation: RENAL AND LOW SODIUM DIET  Filed Weights   07/01/12 0120  Weight: 127 kg (279 lb 15.8 oz)    History of present illness:   Jane [name] is a 66 y.o. female  With past medical history of chronic kidney disease stage IV, anemia with a hemoglobin around 9, atrial fibrillation on chronic Coumadin, hypertension that comes in for bright red blood per rectum 4 days prior to admission. She relates she's had a bloody bowel movement daily about 2-3 teaspoons of blood with her stools. She did not feel chest pain, shortness of breath, palpitation, leg pains, change in vision, no weakness, no lightheadedness.  But symptoms were not improving she decided to come to the emergency room.  Last colonoscopy was 4 years prior to this admission she and multiple polyps at this time.  Mild dysuria with urination this started 3 days prior to admission.  Hospital Course:  [place] GI bleeding: stopped. - This most likely secondary to a supratherapeutic INR, she does not have a history of diverticulosis, she had a colonoscopy 4 years prior to admission at that time she had multiple polyps removed. She's not having any tenesmus. She relates she does have hemorrhoids and has been using Preparation H.- She's not actively bleeding at this time, we have let her INR drift down. Her INR on discharge is 2.24 - GI consulted recommending as she is not actively bleeding , can follow up with her GI in [place].  UTI:  -  U/A showed 21-50 white blood cells, many bacteria, she is complaining of some mild dysuria with urination. We have discharged her on an antibiotic to compelte the course.  Chronic kidney disease, stage IV (severe)  -At baseline as per patient, . Atrial fibrillation  - Currently in sinus rhythm, and rate control. INR supratherapeutic. Let it drift down and therapeutic on discharge.  Hypertension:  - Good control continue current home meds. No changes were made.       Consultations:  GI   Discharge Exam: Filed Vitals:   07/01/12 2226 07/02/12 0543 07/02/12 0849 07/02/12 1347  BP: 125/53 105/46 132/55 154/48  Pulse: 65 66 60 60  Temp: 97.7 F (36.5 C) 98.2 F (36.8 C)  98 F (36.7 C)  TempSrc: Oral Oral  Oral  Resp: 20 20  18   Height:      Weight:      SpO2: 100% 95%  100%   General: Alert afebrile comfortable  Cardiovascular: s1s2  Respiratory: CTAB  Abdomen: soft NT ND BS+    Discharge Instructions  Discharge Orders    Future Orders Please Complete By Expires   Diet general      Discharge instructions      Comments:   Follow up with GI as soon as possible.   Activity as tolerated - No restrictions          Medication List     As of 07/18/2012  6:16 PM    TAKE these medications         amiodarone 200 MG  tablet   Commonly known as: PACERONE   Take 200 mg by mouth daily.      carvedilol 25 MG tablet   Commonly known as: COREG   Take 50 mg by mouth 2 (two) times daily with a meal.      cephALEXin 500 MG capsule   Commonly known as: KEFLEX   Take 1 capsule (500 mg total) by mouth 2 (two) times daily.      cholecalciferol 1000 UNITS tablet   Commonly known as: VITAMIN D   Take 1,000 Units by mouth daily.      co-enzyme Q-10 30 MG capsule   Take 100 mg by mouth daily.      colchicine 0.6 MG tablet   Take 0.6 mg by mouth daily as needed. gout      ferrous sulfate 325 (65 FE) MG tablet   Take 325 mg by mouth 2 (two) times daily.      furosemide  20 MG tablet   Commonly known as: LASIX   Take 20 mg by mouth 2 (two) times daily.      pantoprazole 40 MG tablet   Commonly known as: PROTONIX   Take 40 mg by mouth daily.      spironolactone 25 MG tablet   Commonly known as: ALDACTONE   Take 12.5 mg by mouth 2 (two) times daily.      thiamine 100 MG tablet   Commonly known as: VITAMIN B-1   Take 100 mg by mouth daily.      vitamin C 500 MG tablet   Commonly known as: ASCORBIC ACID   Take 500 mg by mouth daily.      warfarin 5 MG tablet   Commonly known as: COUMADIN   Take 1 tablet (5 mg total) by mouth daily.          The results of significant diagnostics from this hospitalization (including imaging, microbiology, ancillary and laboratory) are listed below for reference.    Significant Diagnostic Studies: No results found.  Microbiology: No results found for this or any previous visit (from the past 240 hour(s)).   Labs: Basic Metabolic Panel: No results found for this basename: NA:5,K:5,CL:5,CO2:5,GLUCOSE:5,BUN:5,CREATININE:5,CALCIUM:5,MG:5,PHOS:5 in the last 168 hours Liver Function Tests: No results found for this basename: AST:5,ALT:5,ALKPHOS:5,BILITOT:5,PROT:5,ALBUMIN:5 in the last 168 hours No results found for this basename: LIPASE:5,AMYLASE:5 in the last 168 hours No results found for this basename: AMMONIA:5 in the last 168 hours CBC: No results found for this basename: WBC:5,NEUTROABS:5,HGB:5,HCT:5,MCV:5,PLT:5 in the last 168 hours Cardiac Enzymes: No results found for this basename: CKTOTAL:5,CKMB:5,CKMBINDEX:5,TROPONINI:5 in the last 168 hours BNP: BNP (last 3 results) No results found for this basename: PROBNP:3 in the last 8760 hours CBG: No results found for this basename: GLUCAP:5 in the last 168 hours     Signed:  Taos Tapp  [facility] 07/18/2012, 6:16 PM

## 2014-07-22 DIAGNOSIS — Z992 Dependence on renal dialysis: Secondary | ICD-10-CM | POA: Diagnosis not present

## 2014-07-22 DIAGNOSIS — N2581 Secondary hyperparathyroidism of renal origin: Secondary | ICD-10-CM | POA: Diagnosis not present

## 2014-07-22 DIAGNOSIS — N186 End stage renal disease: Secondary | ICD-10-CM | POA: Diagnosis not present

## 2014-07-22 DIAGNOSIS — D509 Iron deficiency anemia, unspecified: Secondary | ICD-10-CM | POA: Diagnosis not present

## 2014-07-25 DIAGNOSIS — Z992 Dependence on renal dialysis: Secondary | ICD-10-CM | POA: Diagnosis not present

## 2014-07-25 DIAGNOSIS — D509 Iron deficiency anemia, unspecified: Secondary | ICD-10-CM | POA: Diagnosis not present

## 2014-07-25 DIAGNOSIS — N2581 Secondary hyperparathyroidism of renal origin: Secondary | ICD-10-CM | POA: Diagnosis not present

## 2014-07-25 DIAGNOSIS — N186 End stage renal disease: Secondary | ICD-10-CM | POA: Diagnosis not present

## 2014-07-27 DIAGNOSIS — N186 End stage renal disease: Secondary | ICD-10-CM | POA: Diagnosis not present

## 2014-07-27 DIAGNOSIS — Z992 Dependence on renal dialysis: Secondary | ICD-10-CM | POA: Diagnosis not present

## 2014-07-27 DIAGNOSIS — D509 Iron deficiency anemia, unspecified: Secondary | ICD-10-CM | POA: Diagnosis not present

## 2014-07-27 DIAGNOSIS — N2581 Secondary hyperparathyroidism of renal origin: Secondary | ICD-10-CM | POA: Diagnosis not present

## 2014-07-29 DIAGNOSIS — N186 End stage renal disease: Secondary | ICD-10-CM | POA: Diagnosis not present

## 2014-07-29 DIAGNOSIS — N2581 Secondary hyperparathyroidism of renal origin: Secondary | ICD-10-CM | POA: Diagnosis not present

## 2014-07-29 DIAGNOSIS — Z992 Dependence on renal dialysis: Secondary | ICD-10-CM | POA: Diagnosis not present

## 2014-07-29 DIAGNOSIS — D509 Iron deficiency anemia, unspecified: Secondary | ICD-10-CM | POA: Diagnosis not present

## 2014-07-31 DIAGNOSIS — I4891 Unspecified atrial fibrillation: Secondary | ICD-10-CM | POA: Diagnosis not present

## 2014-08-01 DIAGNOSIS — Z992 Dependence on renal dialysis: Secondary | ICD-10-CM | POA: Diagnosis not present

## 2014-08-01 DIAGNOSIS — N186 End stage renal disease: Secondary | ICD-10-CM | POA: Diagnosis not present

## 2014-08-01 DIAGNOSIS — N2581 Secondary hyperparathyroidism of renal origin: Secondary | ICD-10-CM | POA: Diagnosis not present

## 2014-08-01 DIAGNOSIS — D509 Iron deficiency anemia, unspecified: Secondary | ICD-10-CM | POA: Diagnosis not present

## 2014-08-03 DIAGNOSIS — N2581 Secondary hyperparathyroidism of renal origin: Secondary | ICD-10-CM | POA: Diagnosis not present

## 2014-08-03 DIAGNOSIS — N186 End stage renal disease: Secondary | ICD-10-CM | POA: Diagnosis not present

## 2014-08-03 DIAGNOSIS — D509 Iron deficiency anemia, unspecified: Secondary | ICD-10-CM | POA: Diagnosis not present

## 2014-08-03 DIAGNOSIS — Z992 Dependence on renal dialysis: Secondary | ICD-10-CM | POA: Diagnosis not present

## 2014-08-05 DIAGNOSIS — D509 Iron deficiency anemia, unspecified: Secondary | ICD-10-CM | POA: Diagnosis not present

## 2014-08-05 DIAGNOSIS — N186 End stage renal disease: Secondary | ICD-10-CM | POA: Diagnosis not present

## 2014-08-05 DIAGNOSIS — Z992 Dependence on renal dialysis: Secondary | ICD-10-CM | POA: Diagnosis not present

## 2014-08-05 DIAGNOSIS — N2581 Secondary hyperparathyroidism of renal origin: Secondary | ICD-10-CM | POA: Diagnosis not present

## 2014-08-08 DIAGNOSIS — D509 Iron deficiency anemia, unspecified: Secondary | ICD-10-CM | POA: Diagnosis not present

## 2014-08-08 DIAGNOSIS — N186 End stage renal disease: Secondary | ICD-10-CM | POA: Diagnosis not present

## 2014-08-08 DIAGNOSIS — N2581 Secondary hyperparathyroidism of renal origin: Secondary | ICD-10-CM | POA: Diagnosis not present

## 2014-08-08 DIAGNOSIS — Z992 Dependence on renal dialysis: Secondary | ICD-10-CM | POA: Diagnosis not present

## 2014-08-10 DIAGNOSIS — Z992 Dependence on renal dialysis: Secondary | ICD-10-CM | POA: Diagnosis not present

## 2014-08-10 DIAGNOSIS — N186 End stage renal disease: Secondary | ICD-10-CM | POA: Diagnosis not present

## 2014-08-10 DIAGNOSIS — N2581 Secondary hyperparathyroidism of renal origin: Secondary | ICD-10-CM | POA: Diagnosis not present

## 2014-08-10 DIAGNOSIS — D509 Iron deficiency anemia, unspecified: Secondary | ICD-10-CM | POA: Diagnosis not present

## 2014-08-12 DIAGNOSIS — D509 Iron deficiency anemia, unspecified: Secondary | ICD-10-CM | POA: Diagnosis not present

## 2014-08-12 DIAGNOSIS — Z992 Dependence on renal dialysis: Secondary | ICD-10-CM | POA: Diagnosis not present

## 2014-08-12 DIAGNOSIS — N2581 Secondary hyperparathyroidism of renal origin: Secondary | ICD-10-CM | POA: Diagnosis not present

## 2014-08-12 DIAGNOSIS — N186 End stage renal disease: Secondary | ICD-10-CM | POA: Diagnosis not present

## 2014-08-15 DIAGNOSIS — M069 Rheumatoid arthritis, unspecified: Secondary | ICD-10-CM | POA: Diagnosis not present

## 2014-08-15 DIAGNOSIS — R6883 Chills (without fever): Secondary | ICD-10-CM | POA: Diagnosis not present

## 2014-08-15 DIAGNOSIS — R0981 Nasal congestion: Secondary | ICD-10-CM | POA: Diagnosis not present

## 2014-08-15 DIAGNOSIS — J45909 Unspecified asthma, uncomplicated: Secondary | ICD-10-CM | POA: Diagnosis not present

## 2014-08-15 DIAGNOSIS — I4891 Unspecified atrial fibrillation: Secondary | ICD-10-CM | POA: Diagnosis not present

## 2014-08-15 DIAGNOSIS — R05 Cough: Secondary | ICD-10-CM | POA: Diagnosis not present

## 2014-08-15 DIAGNOSIS — N186 End stage renal disease: Secondary | ICD-10-CM | POA: Diagnosis not present

## 2014-08-15 DIAGNOSIS — R197 Diarrhea, unspecified: Secondary | ICD-10-CM | POA: Diagnosis not present

## 2014-08-15 DIAGNOSIS — Z992 Dependence on renal dialysis: Secondary | ICD-10-CM | POA: Diagnosis not present

## 2014-08-15 DIAGNOSIS — M1990 Unspecified osteoarthritis, unspecified site: Secondary | ICD-10-CM | POA: Diagnosis not present

## 2014-08-15 DIAGNOSIS — I10 Essential (primary) hypertension: Secondary | ICD-10-CM | POA: Diagnosis not present

## 2014-08-15 DIAGNOSIS — J9811 Atelectasis: Secondary | ICD-10-CM | POA: Diagnosis not present

## 2014-08-15 DIAGNOSIS — R531 Weakness: Secondary | ICD-10-CM | POA: Diagnosis not present

## 2014-08-15 DIAGNOSIS — M549 Dorsalgia, unspecified: Secondary | ICD-10-CM | POA: Diagnosis not present

## 2014-08-15 DIAGNOSIS — I959 Hypotension, unspecified: Secondary | ICD-10-CM | POA: Diagnosis not present

## 2014-08-15 DIAGNOSIS — Z7901 Long term (current) use of anticoagulants: Secondary | ICD-10-CM | POA: Diagnosis not present

## 2014-08-16 DIAGNOSIS — N2581 Secondary hyperparathyroidism of renal origin: Secondary | ICD-10-CM | POA: Diagnosis not present

## 2014-08-16 DIAGNOSIS — D509 Iron deficiency anemia, unspecified: Secondary | ICD-10-CM | POA: Diagnosis not present

## 2014-08-16 DIAGNOSIS — Z992 Dependence on renal dialysis: Secondary | ICD-10-CM | POA: Diagnosis not present

## 2014-08-16 DIAGNOSIS — N186 End stage renal disease: Secondary | ICD-10-CM | POA: Diagnosis not present

## 2014-08-17 DIAGNOSIS — N2581 Secondary hyperparathyroidism of renal origin: Secondary | ICD-10-CM | POA: Diagnosis not present

## 2014-08-17 DIAGNOSIS — Z992 Dependence on renal dialysis: Secondary | ICD-10-CM | POA: Diagnosis not present

## 2014-08-17 DIAGNOSIS — D509 Iron deficiency anemia, unspecified: Secondary | ICD-10-CM | POA: Diagnosis not present

## 2014-08-17 DIAGNOSIS — N186 End stage renal disease: Secondary | ICD-10-CM | POA: Diagnosis not present

## 2014-08-19 DIAGNOSIS — N2581 Secondary hyperparathyroidism of renal origin: Secondary | ICD-10-CM | POA: Diagnosis not present

## 2014-08-19 DIAGNOSIS — D509 Iron deficiency anemia, unspecified: Secondary | ICD-10-CM | POA: Diagnosis not present

## 2014-08-19 DIAGNOSIS — Z992 Dependence on renal dialysis: Secondary | ICD-10-CM | POA: Diagnosis not present

## 2014-08-19 DIAGNOSIS — N186 End stage renal disease: Secondary | ICD-10-CM | POA: Diagnosis not present

## 2014-08-20 DIAGNOSIS — N186 End stage renal disease: Secondary | ICD-10-CM | POA: Diagnosis not present

## 2014-08-21 DIAGNOSIS — I4891 Unspecified atrial fibrillation: Secondary | ICD-10-CM | POA: Diagnosis not present

## 2014-08-22 DIAGNOSIS — N2581 Secondary hyperparathyroidism of renal origin: Secondary | ICD-10-CM | POA: Diagnosis not present

## 2014-08-22 DIAGNOSIS — Z992 Dependence on renal dialysis: Secondary | ICD-10-CM | POA: Diagnosis not present

## 2014-08-22 DIAGNOSIS — D509 Iron deficiency anemia, unspecified: Secondary | ICD-10-CM | POA: Diagnosis not present

## 2014-08-22 DIAGNOSIS — N186 End stage renal disease: Secondary | ICD-10-CM | POA: Diagnosis not present

## 2014-08-24 DIAGNOSIS — N186 End stage renal disease: Secondary | ICD-10-CM | POA: Diagnosis not present

## 2014-08-24 DIAGNOSIS — Z992 Dependence on renal dialysis: Secondary | ICD-10-CM | POA: Diagnosis not present

## 2014-08-24 DIAGNOSIS — D509 Iron deficiency anemia, unspecified: Secondary | ICD-10-CM | POA: Diagnosis not present

## 2014-08-24 DIAGNOSIS — N2581 Secondary hyperparathyroidism of renal origin: Secondary | ICD-10-CM | POA: Diagnosis not present

## 2014-08-26 DIAGNOSIS — N2581 Secondary hyperparathyroidism of renal origin: Secondary | ICD-10-CM | POA: Diagnosis not present

## 2014-08-26 DIAGNOSIS — D509 Iron deficiency anemia, unspecified: Secondary | ICD-10-CM | POA: Diagnosis not present

## 2014-08-26 DIAGNOSIS — Z992 Dependence on renal dialysis: Secondary | ICD-10-CM | POA: Diagnosis not present

## 2014-08-26 DIAGNOSIS — N186 End stage renal disease: Secondary | ICD-10-CM | POA: Diagnosis not present

## 2014-08-29 DIAGNOSIS — N186 End stage renal disease: Secondary | ICD-10-CM | POA: Diagnosis not present

## 2014-08-29 DIAGNOSIS — D509 Iron deficiency anemia, unspecified: Secondary | ICD-10-CM | POA: Diagnosis not present

## 2014-08-29 DIAGNOSIS — Z992 Dependence on renal dialysis: Secondary | ICD-10-CM | POA: Diagnosis not present

## 2014-08-29 DIAGNOSIS — N2581 Secondary hyperparathyroidism of renal origin: Secondary | ICD-10-CM | POA: Diagnosis not present

## 2014-08-31 DIAGNOSIS — D509 Iron deficiency anemia, unspecified: Secondary | ICD-10-CM | POA: Diagnosis not present

## 2014-08-31 DIAGNOSIS — N186 End stage renal disease: Secondary | ICD-10-CM | POA: Diagnosis not present

## 2014-08-31 DIAGNOSIS — Z992 Dependence on renal dialysis: Secondary | ICD-10-CM | POA: Diagnosis not present

## 2014-08-31 DIAGNOSIS — N2581 Secondary hyperparathyroidism of renal origin: Secondary | ICD-10-CM | POA: Diagnosis not present

## 2014-09-02 DIAGNOSIS — D509 Iron deficiency anemia, unspecified: Secondary | ICD-10-CM | POA: Diagnosis not present

## 2014-09-02 DIAGNOSIS — N186 End stage renal disease: Secondary | ICD-10-CM | POA: Diagnosis not present

## 2014-09-02 DIAGNOSIS — N2581 Secondary hyperparathyroidism of renal origin: Secondary | ICD-10-CM | POA: Diagnosis not present

## 2014-09-02 DIAGNOSIS — Z992 Dependence on renal dialysis: Secondary | ICD-10-CM | POA: Diagnosis not present

## 2014-09-05 DIAGNOSIS — Z992 Dependence on renal dialysis: Secondary | ICD-10-CM | POA: Diagnosis not present

## 2014-09-05 DIAGNOSIS — N186 End stage renal disease: Secondary | ICD-10-CM | POA: Diagnosis not present

## 2014-09-05 DIAGNOSIS — D509 Iron deficiency anemia, unspecified: Secondary | ICD-10-CM | POA: Diagnosis not present

## 2014-09-05 DIAGNOSIS — N2581 Secondary hyperparathyroidism of renal origin: Secondary | ICD-10-CM | POA: Diagnosis not present

## 2014-09-07 DIAGNOSIS — Z992 Dependence on renal dialysis: Secondary | ICD-10-CM | POA: Diagnosis not present

## 2014-09-07 DIAGNOSIS — D509 Iron deficiency anemia, unspecified: Secondary | ICD-10-CM | POA: Diagnosis not present

## 2014-09-07 DIAGNOSIS — N186 End stage renal disease: Secondary | ICD-10-CM | POA: Diagnosis not present

## 2014-09-07 DIAGNOSIS — N2581 Secondary hyperparathyroidism of renal origin: Secondary | ICD-10-CM | POA: Diagnosis not present

## 2014-09-09 DIAGNOSIS — N186 End stage renal disease: Secondary | ICD-10-CM | POA: Diagnosis not present

## 2014-09-09 DIAGNOSIS — Z992 Dependence on renal dialysis: Secondary | ICD-10-CM | POA: Diagnosis not present

## 2014-09-09 DIAGNOSIS — D509 Iron deficiency anemia, unspecified: Secondary | ICD-10-CM | POA: Diagnosis not present

## 2014-09-09 DIAGNOSIS — N2581 Secondary hyperparathyroidism of renal origin: Secondary | ICD-10-CM | POA: Diagnosis not present

## 2014-09-11 DIAGNOSIS — I4891 Unspecified atrial fibrillation: Secondary | ICD-10-CM | POA: Diagnosis not present

## 2014-09-12 DIAGNOSIS — Z992 Dependence on renal dialysis: Secondary | ICD-10-CM | POA: Diagnosis not present

## 2014-09-12 DIAGNOSIS — N2581 Secondary hyperparathyroidism of renal origin: Secondary | ICD-10-CM | POA: Diagnosis not present

## 2014-09-12 DIAGNOSIS — D509 Iron deficiency anemia, unspecified: Secondary | ICD-10-CM | POA: Diagnosis not present

## 2014-09-12 DIAGNOSIS — N186 End stage renal disease: Secondary | ICD-10-CM | POA: Diagnosis not present

## 2014-09-14 DIAGNOSIS — D509 Iron deficiency anemia, unspecified: Secondary | ICD-10-CM | POA: Diagnosis not present

## 2014-09-14 DIAGNOSIS — Z992 Dependence on renal dialysis: Secondary | ICD-10-CM | POA: Diagnosis not present

## 2014-09-14 DIAGNOSIS — N186 End stage renal disease: Secondary | ICD-10-CM | POA: Diagnosis not present

## 2014-09-14 DIAGNOSIS — N2581 Secondary hyperparathyroidism of renal origin: Secondary | ICD-10-CM | POA: Diagnosis not present

## 2014-09-16 DIAGNOSIS — D509 Iron deficiency anemia, unspecified: Secondary | ICD-10-CM | POA: Diagnosis not present

## 2014-09-16 DIAGNOSIS — Z992 Dependence on renal dialysis: Secondary | ICD-10-CM | POA: Diagnosis not present

## 2014-09-16 DIAGNOSIS — N186 End stage renal disease: Secondary | ICD-10-CM | POA: Diagnosis not present

## 2014-09-16 DIAGNOSIS — N2581 Secondary hyperparathyroidism of renal origin: Secondary | ICD-10-CM | POA: Diagnosis not present

## 2014-09-18 DIAGNOSIS — N186 End stage renal disease: Secondary | ICD-10-CM | POA: Diagnosis not present

## 2014-09-19 DIAGNOSIS — N2581 Secondary hyperparathyroidism of renal origin: Secondary | ICD-10-CM | POA: Diagnosis not present

## 2014-09-19 DIAGNOSIS — N186 End stage renal disease: Secondary | ICD-10-CM | POA: Diagnosis not present

## 2014-09-19 DIAGNOSIS — Z992 Dependence on renal dialysis: Secondary | ICD-10-CM | POA: Diagnosis not present

## 2014-09-19 DIAGNOSIS — D509 Iron deficiency anemia, unspecified: Secondary | ICD-10-CM | POA: Diagnosis not present

## 2014-09-20 DIAGNOSIS — E65 Localized adiposity: Secondary | ICD-10-CM | POA: Diagnosis not present

## 2014-09-20 DIAGNOSIS — Z7189 Other specified counseling: Secondary | ICD-10-CM | POA: Diagnosis not present

## 2014-09-20 DIAGNOSIS — K625 Hemorrhage of anus and rectum: Secondary | ICD-10-CM | POA: Diagnosis not present

## 2014-09-20 DIAGNOSIS — R1011 Right upper quadrant pain: Secondary | ICD-10-CM | POA: Diagnosis not present

## 2014-09-21 DIAGNOSIS — N2581 Secondary hyperparathyroidism of renal origin: Secondary | ICD-10-CM | POA: Diagnosis not present

## 2014-09-21 DIAGNOSIS — D509 Iron deficiency anemia, unspecified: Secondary | ICD-10-CM | POA: Diagnosis not present

## 2014-09-21 DIAGNOSIS — N186 End stage renal disease: Secondary | ICD-10-CM | POA: Diagnosis not present

## 2014-09-21 DIAGNOSIS — Z992 Dependence on renal dialysis: Secondary | ICD-10-CM | POA: Diagnosis not present

## 2014-09-23 DIAGNOSIS — N186 End stage renal disease: Secondary | ICD-10-CM | POA: Diagnosis not present

## 2014-09-23 DIAGNOSIS — D509 Iron deficiency anemia, unspecified: Secondary | ICD-10-CM | POA: Diagnosis not present

## 2014-09-23 DIAGNOSIS — Z992 Dependence on renal dialysis: Secondary | ICD-10-CM | POA: Diagnosis not present

## 2014-09-23 DIAGNOSIS — N2581 Secondary hyperparathyroidism of renal origin: Secondary | ICD-10-CM | POA: Diagnosis not present

## 2014-09-26 DIAGNOSIS — Z992 Dependence on renal dialysis: Secondary | ICD-10-CM | POA: Diagnosis not present

## 2014-09-26 DIAGNOSIS — N2581 Secondary hyperparathyroidism of renal origin: Secondary | ICD-10-CM | POA: Diagnosis not present

## 2014-09-26 DIAGNOSIS — N186 End stage renal disease: Secondary | ICD-10-CM | POA: Diagnosis not present

## 2014-09-26 DIAGNOSIS — D509 Iron deficiency anemia, unspecified: Secondary | ICD-10-CM | POA: Diagnosis not present

## 2014-09-28 DIAGNOSIS — Z992 Dependence on renal dialysis: Secondary | ICD-10-CM | POA: Diagnosis not present

## 2014-09-28 DIAGNOSIS — N186 End stage renal disease: Secondary | ICD-10-CM | POA: Diagnosis not present

## 2014-09-28 DIAGNOSIS — N2581 Secondary hyperparathyroidism of renal origin: Secondary | ICD-10-CM | POA: Diagnosis not present

## 2014-09-28 DIAGNOSIS — D509 Iron deficiency anemia, unspecified: Secondary | ICD-10-CM | POA: Diagnosis not present

## 2014-09-30 DIAGNOSIS — N186 End stage renal disease: Secondary | ICD-10-CM | POA: Diagnosis not present

## 2014-09-30 DIAGNOSIS — N2581 Secondary hyperparathyroidism of renal origin: Secondary | ICD-10-CM | POA: Diagnosis not present

## 2014-09-30 DIAGNOSIS — Z992 Dependence on renal dialysis: Secondary | ICD-10-CM | POA: Diagnosis not present

## 2014-09-30 DIAGNOSIS — D509 Iron deficiency anemia, unspecified: Secondary | ICD-10-CM | POA: Diagnosis not present

## 2014-10-02 DIAGNOSIS — I4891 Unspecified atrial fibrillation: Secondary | ICD-10-CM | POA: Diagnosis not present

## 2014-10-03 DIAGNOSIS — Z992 Dependence on renal dialysis: Secondary | ICD-10-CM | POA: Diagnosis not present

## 2014-10-03 DIAGNOSIS — N2581 Secondary hyperparathyroidism of renal origin: Secondary | ICD-10-CM | POA: Diagnosis not present

## 2014-10-03 DIAGNOSIS — D509 Iron deficiency anemia, unspecified: Secondary | ICD-10-CM | POA: Diagnosis not present

## 2014-10-03 DIAGNOSIS — N186 End stage renal disease: Secondary | ICD-10-CM | POA: Diagnosis not present

## 2014-10-04 DIAGNOSIS — R109 Unspecified abdominal pain: Secondary | ICD-10-CM | POA: Diagnosis not present

## 2014-10-05 DIAGNOSIS — D509 Iron deficiency anemia, unspecified: Secondary | ICD-10-CM | POA: Diagnosis not present

## 2014-10-05 DIAGNOSIS — N186 End stage renal disease: Secondary | ICD-10-CM | POA: Diagnosis not present

## 2014-10-05 DIAGNOSIS — Z992 Dependence on renal dialysis: Secondary | ICD-10-CM | POA: Diagnosis not present

## 2014-10-05 DIAGNOSIS — N2581 Secondary hyperparathyroidism of renal origin: Secondary | ICD-10-CM | POA: Diagnosis not present

## 2014-10-07 DIAGNOSIS — D509 Iron deficiency anemia, unspecified: Secondary | ICD-10-CM | POA: Diagnosis not present

## 2014-10-07 DIAGNOSIS — N186 End stage renal disease: Secondary | ICD-10-CM | POA: Diagnosis not present

## 2014-10-07 DIAGNOSIS — N2581 Secondary hyperparathyroidism of renal origin: Secondary | ICD-10-CM | POA: Diagnosis not present

## 2014-10-07 DIAGNOSIS — Z992 Dependence on renal dialysis: Secondary | ICD-10-CM | POA: Diagnosis not present

## 2014-10-10 DIAGNOSIS — N2581 Secondary hyperparathyroidism of renal origin: Secondary | ICD-10-CM | POA: Diagnosis not present

## 2014-10-10 DIAGNOSIS — N186 End stage renal disease: Secondary | ICD-10-CM | POA: Diagnosis not present

## 2014-10-10 DIAGNOSIS — D509 Iron deficiency anemia, unspecified: Secondary | ICD-10-CM | POA: Diagnosis not present

## 2014-10-10 DIAGNOSIS — Z992 Dependence on renal dialysis: Secondary | ICD-10-CM | POA: Diagnosis not present

## 2014-10-12 DIAGNOSIS — N2581 Secondary hyperparathyroidism of renal origin: Secondary | ICD-10-CM | POA: Diagnosis not present

## 2014-10-12 DIAGNOSIS — D509 Iron deficiency anemia, unspecified: Secondary | ICD-10-CM | POA: Diagnosis not present

## 2014-10-12 DIAGNOSIS — N186 End stage renal disease: Secondary | ICD-10-CM | POA: Diagnosis not present

## 2014-10-12 DIAGNOSIS — Z992 Dependence on renal dialysis: Secondary | ICD-10-CM | POA: Diagnosis not present

## 2014-10-14 DIAGNOSIS — N186 End stage renal disease: Secondary | ICD-10-CM | POA: Diagnosis not present

## 2014-10-14 DIAGNOSIS — D509 Iron deficiency anemia, unspecified: Secondary | ICD-10-CM | POA: Diagnosis not present

## 2014-10-14 DIAGNOSIS — Z992 Dependence on renal dialysis: Secondary | ICD-10-CM | POA: Diagnosis not present

## 2014-10-14 DIAGNOSIS — N2581 Secondary hyperparathyroidism of renal origin: Secondary | ICD-10-CM | POA: Diagnosis not present

## 2014-10-17 DIAGNOSIS — D509 Iron deficiency anemia, unspecified: Secondary | ICD-10-CM | POA: Diagnosis not present

## 2014-10-17 DIAGNOSIS — N2581 Secondary hyperparathyroidism of renal origin: Secondary | ICD-10-CM | POA: Diagnosis not present

## 2014-10-17 DIAGNOSIS — Z992 Dependence on renal dialysis: Secondary | ICD-10-CM | POA: Diagnosis not present

## 2014-10-17 DIAGNOSIS — N186 End stage renal disease: Secondary | ICD-10-CM | POA: Diagnosis not present

## 2014-10-19 DIAGNOSIS — Z992 Dependence on renal dialysis: Secondary | ICD-10-CM | POA: Diagnosis not present

## 2014-10-19 DIAGNOSIS — N2581 Secondary hyperparathyroidism of renal origin: Secondary | ICD-10-CM | POA: Diagnosis not present

## 2014-10-19 DIAGNOSIS — D509 Iron deficiency anemia, unspecified: Secondary | ICD-10-CM | POA: Diagnosis not present

## 2014-10-19 DIAGNOSIS — N186 End stage renal disease: Secondary | ICD-10-CM | POA: Diagnosis not present

## 2014-10-21 DIAGNOSIS — N186 End stage renal disease: Secondary | ICD-10-CM | POA: Diagnosis not present

## 2014-10-21 DIAGNOSIS — N2581 Secondary hyperparathyroidism of renal origin: Secondary | ICD-10-CM | POA: Diagnosis not present

## 2014-10-21 DIAGNOSIS — D509 Iron deficiency anemia, unspecified: Secondary | ICD-10-CM | POA: Diagnosis not present

## 2014-10-21 DIAGNOSIS — Z992 Dependence on renal dialysis: Secondary | ICD-10-CM | POA: Diagnosis not present

## 2014-10-24 DIAGNOSIS — N186 End stage renal disease: Secondary | ICD-10-CM | POA: Diagnosis not present

## 2014-10-24 DIAGNOSIS — Z992 Dependence on renal dialysis: Secondary | ICD-10-CM | POA: Diagnosis not present

## 2014-10-24 DIAGNOSIS — N2581 Secondary hyperparathyroidism of renal origin: Secondary | ICD-10-CM | POA: Diagnosis not present

## 2014-10-24 DIAGNOSIS — D509 Iron deficiency anemia, unspecified: Secondary | ICD-10-CM | POA: Diagnosis not present

## 2014-10-26 DIAGNOSIS — N2581 Secondary hyperparathyroidism of renal origin: Secondary | ICD-10-CM | POA: Diagnosis not present

## 2014-10-26 DIAGNOSIS — Z992 Dependence on renal dialysis: Secondary | ICD-10-CM | POA: Diagnosis not present

## 2014-10-26 DIAGNOSIS — D509 Iron deficiency anemia, unspecified: Secondary | ICD-10-CM | POA: Diagnosis not present

## 2014-10-26 DIAGNOSIS — N186 End stage renal disease: Secondary | ICD-10-CM | POA: Diagnosis not present

## 2014-10-28 DIAGNOSIS — Z992 Dependence on renal dialysis: Secondary | ICD-10-CM | POA: Diagnosis not present

## 2014-10-28 DIAGNOSIS — N186 End stage renal disease: Secondary | ICD-10-CM | POA: Diagnosis not present

## 2014-10-28 DIAGNOSIS — N2581 Secondary hyperparathyroidism of renal origin: Secondary | ICD-10-CM | POA: Diagnosis not present

## 2014-10-28 DIAGNOSIS — D509 Iron deficiency anemia, unspecified: Secondary | ICD-10-CM | POA: Diagnosis not present

## 2014-10-30 DIAGNOSIS — I4891 Unspecified atrial fibrillation: Secondary | ICD-10-CM | POA: Diagnosis not present

## 2014-10-31 DIAGNOSIS — N186 End stage renal disease: Secondary | ICD-10-CM | POA: Diagnosis not present

## 2014-10-31 DIAGNOSIS — D509 Iron deficiency anemia, unspecified: Secondary | ICD-10-CM | POA: Diagnosis not present

## 2014-10-31 DIAGNOSIS — Z992 Dependence on renal dialysis: Secondary | ICD-10-CM | POA: Diagnosis not present

## 2014-10-31 DIAGNOSIS — N2581 Secondary hyperparathyroidism of renal origin: Secondary | ICD-10-CM | POA: Diagnosis not present

## 2014-11-02 DIAGNOSIS — N186 End stage renal disease: Secondary | ICD-10-CM | POA: Diagnosis not present

## 2014-11-02 DIAGNOSIS — Z992 Dependence on renal dialysis: Secondary | ICD-10-CM | POA: Diagnosis not present

## 2014-11-02 DIAGNOSIS — D509 Iron deficiency anemia, unspecified: Secondary | ICD-10-CM | POA: Diagnosis not present

## 2014-11-02 DIAGNOSIS — N2581 Secondary hyperparathyroidism of renal origin: Secondary | ICD-10-CM | POA: Diagnosis not present

## 2014-11-04 DIAGNOSIS — D509 Iron deficiency anemia, unspecified: Secondary | ICD-10-CM | POA: Diagnosis not present

## 2014-11-04 DIAGNOSIS — Z992 Dependence on renal dialysis: Secondary | ICD-10-CM | POA: Diagnosis not present

## 2014-11-04 DIAGNOSIS — N186 End stage renal disease: Secondary | ICD-10-CM | POA: Diagnosis not present

## 2014-11-04 DIAGNOSIS — N2581 Secondary hyperparathyroidism of renal origin: Secondary | ICD-10-CM | POA: Diagnosis not present

## 2014-11-07 DIAGNOSIS — N186 End stage renal disease: Secondary | ICD-10-CM | POA: Diagnosis not present

## 2014-11-07 DIAGNOSIS — D509 Iron deficiency anemia, unspecified: Secondary | ICD-10-CM | POA: Diagnosis not present

## 2014-11-07 DIAGNOSIS — N2581 Secondary hyperparathyroidism of renal origin: Secondary | ICD-10-CM | POA: Diagnosis not present

## 2014-11-07 DIAGNOSIS — Z992 Dependence on renal dialysis: Secondary | ICD-10-CM | POA: Diagnosis not present

## 2014-11-09 DIAGNOSIS — D509 Iron deficiency anemia, unspecified: Secondary | ICD-10-CM | POA: Diagnosis not present

## 2014-11-09 DIAGNOSIS — N2581 Secondary hyperparathyroidism of renal origin: Secondary | ICD-10-CM | POA: Diagnosis not present

## 2014-11-09 DIAGNOSIS — M4316 Spondylolisthesis, lumbar region: Secondary | ICD-10-CM | POA: Diagnosis not present

## 2014-11-09 DIAGNOSIS — M9963 Osseous and subluxation stenosis of intervertebral foramina of lumbar region: Secondary | ICD-10-CM | POA: Diagnosis not present

## 2014-11-09 DIAGNOSIS — N186 End stage renal disease: Secondary | ICD-10-CM | POA: Diagnosis not present

## 2014-11-09 DIAGNOSIS — Z992 Dependence on renal dialysis: Secondary | ICD-10-CM | POA: Diagnosis not present

## 2014-11-11 DIAGNOSIS — N2581 Secondary hyperparathyroidism of renal origin: Secondary | ICD-10-CM | POA: Diagnosis not present

## 2014-11-11 DIAGNOSIS — N186 End stage renal disease: Secondary | ICD-10-CM | POA: Diagnosis not present

## 2014-11-11 DIAGNOSIS — Z992 Dependence on renal dialysis: Secondary | ICD-10-CM | POA: Diagnosis not present

## 2014-11-11 DIAGNOSIS — D509 Iron deficiency anemia, unspecified: Secondary | ICD-10-CM | POA: Diagnosis not present

## 2014-11-14 DIAGNOSIS — N2581 Secondary hyperparathyroidism of renal origin: Secondary | ICD-10-CM | POA: Diagnosis not present

## 2014-11-14 DIAGNOSIS — N186 End stage renal disease: Secondary | ICD-10-CM | POA: Diagnosis not present

## 2014-11-14 DIAGNOSIS — Z992 Dependence on renal dialysis: Secondary | ICD-10-CM | POA: Diagnosis not present

## 2014-11-14 DIAGNOSIS — D509 Iron deficiency anemia, unspecified: Secondary | ICD-10-CM | POA: Diagnosis not present

## 2014-11-16 DIAGNOSIS — D509 Iron deficiency anemia, unspecified: Secondary | ICD-10-CM | POA: Diagnosis not present

## 2014-11-16 DIAGNOSIS — Z992 Dependence on renal dialysis: Secondary | ICD-10-CM | POA: Diagnosis not present

## 2014-11-16 DIAGNOSIS — N2581 Secondary hyperparathyroidism of renal origin: Secondary | ICD-10-CM | POA: Diagnosis not present

## 2014-11-16 DIAGNOSIS — N186 End stage renal disease: Secondary | ICD-10-CM | POA: Diagnosis not present

## 2014-11-18 DIAGNOSIS — Z992 Dependence on renal dialysis: Secondary | ICD-10-CM | POA: Diagnosis not present

## 2014-11-18 DIAGNOSIS — N2581 Secondary hyperparathyroidism of renal origin: Secondary | ICD-10-CM | POA: Diagnosis not present

## 2014-11-18 DIAGNOSIS — N186 End stage renal disease: Secondary | ICD-10-CM | POA: Diagnosis not present

## 2014-11-18 DIAGNOSIS — D509 Iron deficiency anemia, unspecified: Secondary | ICD-10-CM | POA: Diagnosis not present

## 2014-11-21 DIAGNOSIS — D631 Anemia in chronic kidney disease: Secondary | ICD-10-CM | POA: Diagnosis not present

## 2014-11-21 DIAGNOSIS — N186 End stage renal disease: Secondary | ICD-10-CM | POA: Diagnosis not present

## 2014-11-21 DIAGNOSIS — N2581 Secondary hyperparathyroidism of renal origin: Secondary | ICD-10-CM | POA: Diagnosis not present

## 2014-11-21 DIAGNOSIS — D509 Iron deficiency anemia, unspecified: Secondary | ICD-10-CM | POA: Diagnosis not present

## 2014-11-21 DIAGNOSIS — I2510 Atherosclerotic heart disease of native coronary artery without angina pectoris: Secondary | ICD-10-CM | POA: Diagnosis not present

## 2014-11-21 DIAGNOSIS — Z992 Dependence on renal dialysis: Secondary | ICD-10-CM | POA: Diagnosis not present

## 2014-11-23 DIAGNOSIS — N186 End stage renal disease: Secondary | ICD-10-CM | POA: Diagnosis not present

## 2014-11-23 DIAGNOSIS — D631 Anemia in chronic kidney disease: Secondary | ICD-10-CM | POA: Diagnosis not present

## 2014-11-23 DIAGNOSIS — N2581 Secondary hyperparathyroidism of renal origin: Secondary | ICD-10-CM | POA: Diagnosis not present

## 2014-11-23 DIAGNOSIS — D509 Iron deficiency anemia, unspecified: Secondary | ICD-10-CM | POA: Diagnosis not present

## 2014-11-23 DIAGNOSIS — Z992 Dependence on renal dialysis: Secondary | ICD-10-CM | POA: Diagnosis not present

## 2014-11-25 DIAGNOSIS — D509 Iron deficiency anemia, unspecified: Secondary | ICD-10-CM | POA: Diagnosis not present

## 2014-11-25 DIAGNOSIS — D631 Anemia in chronic kidney disease: Secondary | ICD-10-CM | POA: Diagnosis not present

## 2014-11-25 DIAGNOSIS — N2581 Secondary hyperparathyroidism of renal origin: Secondary | ICD-10-CM | POA: Diagnosis not present

## 2014-11-25 DIAGNOSIS — N186 End stage renal disease: Secondary | ICD-10-CM | POA: Diagnosis not present

## 2014-11-25 DIAGNOSIS — Z992 Dependence on renal dialysis: Secondary | ICD-10-CM | POA: Diagnosis not present

## 2014-11-27 DIAGNOSIS — I4891 Unspecified atrial fibrillation: Secondary | ICD-10-CM | POA: Diagnosis not present

## 2014-11-28 DIAGNOSIS — D631 Anemia in chronic kidney disease: Secondary | ICD-10-CM | POA: Diagnosis not present

## 2014-11-28 DIAGNOSIS — N2581 Secondary hyperparathyroidism of renal origin: Secondary | ICD-10-CM | POA: Diagnosis not present

## 2014-11-28 DIAGNOSIS — D509 Iron deficiency anemia, unspecified: Secondary | ICD-10-CM | POA: Diagnosis not present

## 2014-11-28 DIAGNOSIS — N186 End stage renal disease: Secondary | ICD-10-CM | POA: Diagnosis not present

## 2014-11-28 DIAGNOSIS — Z992 Dependence on renal dialysis: Secondary | ICD-10-CM | POA: Diagnosis not present

## 2014-11-30 DIAGNOSIS — N2581 Secondary hyperparathyroidism of renal origin: Secondary | ICD-10-CM | POA: Diagnosis not present

## 2014-11-30 DIAGNOSIS — E785 Hyperlipidemia, unspecified: Secondary | ICD-10-CM | POA: Diagnosis not present

## 2014-11-30 DIAGNOSIS — D631 Anemia in chronic kidney disease: Secondary | ICD-10-CM | POA: Diagnosis not present

## 2014-11-30 DIAGNOSIS — Z992 Dependence on renal dialysis: Secondary | ICD-10-CM | POA: Diagnosis not present

## 2014-11-30 DIAGNOSIS — N186 End stage renal disease: Secondary | ICD-10-CM | POA: Diagnosis not present

## 2014-11-30 DIAGNOSIS — D509 Iron deficiency anemia, unspecified: Secondary | ICD-10-CM | POA: Diagnosis not present

## 2014-12-02 DIAGNOSIS — D509 Iron deficiency anemia, unspecified: Secondary | ICD-10-CM | POA: Diagnosis not present

## 2014-12-02 DIAGNOSIS — N186 End stage renal disease: Secondary | ICD-10-CM | POA: Diagnosis not present

## 2014-12-02 DIAGNOSIS — D631 Anemia in chronic kidney disease: Secondary | ICD-10-CM | POA: Diagnosis not present

## 2014-12-02 DIAGNOSIS — Z992 Dependence on renal dialysis: Secondary | ICD-10-CM | POA: Diagnosis not present

## 2014-12-02 DIAGNOSIS — N2581 Secondary hyperparathyroidism of renal origin: Secondary | ICD-10-CM | POA: Diagnosis not present

## 2014-12-05 DIAGNOSIS — Z992 Dependence on renal dialysis: Secondary | ICD-10-CM | POA: Diagnosis not present

## 2014-12-05 DIAGNOSIS — D631 Anemia in chronic kidney disease: Secondary | ICD-10-CM | POA: Diagnosis not present

## 2014-12-05 DIAGNOSIS — D509 Iron deficiency anemia, unspecified: Secondary | ICD-10-CM | POA: Diagnosis not present

## 2014-12-05 DIAGNOSIS — N186 End stage renal disease: Secondary | ICD-10-CM | POA: Diagnosis not present

## 2014-12-05 DIAGNOSIS — N2581 Secondary hyperparathyroidism of renal origin: Secondary | ICD-10-CM | POA: Diagnosis not present

## 2014-12-07 DIAGNOSIS — N186 End stage renal disease: Secondary | ICD-10-CM | POA: Diagnosis not present

## 2014-12-07 DIAGNOSIS — Z992 Dependence on renal dialysis: Secondary | ICD-10-CM | POA: Diagnosis not present

## 2014-12-07 DIAGNOSIS — D509 Iron deficiency anemia, unspecified: Secondary | ICD-10-CM | POA: Diagnosis not present

## 2014-12-07 DIAGNOSIS — D631 Anemia in chronic kidney disease: Secondary | ICD-10-CM | POA: Diagnosis not present

## 2014-12-07 DIAGNOSIS — N2581 Secondary hyperparathyroidism of renal origin: Secondary | ICD-10-CM | POA: Diagnosis not present

## 2014-12-09 DIAGNOSIS — D509 Iron deficiency anemia, unspecified: Secondary | ICD-10-CM | POA: Diagnosis not present

## 2014-12-09 DIAGNOSIS — N2581 Secondary hyperparathyroidism of renal origin: Secondary | ICD-10-CM | POA: Diagnosis not present

## 2014-12-09 DIAGNOSIS — Z992 Dependence on renal dialysis: Secondary | ICD-10-CM | POA: Diagnosis not present

## 2014-12-09 DIAGNOSIS — D631 Anemia in chronic kidney disease: Secondary | ICD-10-CM | POA: Diagnosis not present

## 2014-12-09 DIAGNOSIS — N186 End stage renal disease: Secondary | ICD-10-CM | POA: Diagnosis not present

## 2014-12-12 DIAGNOSIS — N2581 Secondary hyperparathyroidism of renal origin: Secondary | ICD-10-CM | POA: Diagnosis not present

## 2014-12-12 DIAGNOSIS — Z992 Dependence on renal dialysis: Secondary | ICD-10-CM | POA: Diagnosis not present

## 2014-12-12 DIAGNOSIS — N186 End stage renal disease: Secondary | ICD-10-CM | POA: Diagnosis not present

## 2014-12-12 DIAGNOSIS — D509 Iron deficiency anemia, unspecified: Secondary | ICD-10-CM | POA: Diagnosis not present

## 2014-12-12 DIAGNOSIS — D631 Anemia in chronic kidney disease: Secondary | ICD-10-CM | POA: Diagnosis not present

## 2014-12-13 DIAGNOSIS — I509 Heart failure, unspecified: Secondary | ICD-10-CM | POA: Diagnosis not present

## 2014-12-13 DIAGNOSIS — I4891 Unspecified atrial fibrillation: Secondary | ICD-10-CM | POA: Diagnosis not present

## 2014-12-14 DIAGNOSIS — N2581 Secondary hyperparathyroidism of renal origin: Secondary | ICD-10-CM | POA: Diagnosis not present

## 2014-12-14 DIAGNOSIS — Z992 Dependence on renal dialysis: Secondary | ICD-10-CM | POA: Diagnosis not present

## 2014-12-14 DIAGNOSIS — D509 Iron deficiency anemia, unspecified: Secondary | ICD-10-CM | POA: Diagnosis not present

## 2014-12-14 DIAGNOSIS — N186 End stage renal disease: Secondary | ICD-10-CM | POA: Diagnosis not present

## 2014-12-14 DIAGNOSIS — D631 Anemia in chronic kidney disease: Secondary | ICD-10-CM | POA: Diagnosis not present

## 2014-12-16 DIAGNOSIS — Z992 Dependence on renal dialysis: Secondary | ICD-10-CM | POA: Diagnosis not present

## 2014-12-16 DIAGNOSIS — N186 End stage renal disease: Secondary | ICD-10-CM | POA: Diagnosis not present

## 2014-12-16 DIAGNOSIS — D509 Iron deficiency anemia, unspecified: Secondary | ICD-10-CM | POA: Diagnosis not present

## 2014-12-16 DIAGNOSIS — N2581 Secondary hyperparathyroidism of renal origin: Secondary | ICD-10-CM | POA: Diagnosis not present

## 2014-12-16 DIAGNOSIS — D631 Anemia in chronic kidney disease: Secondary | ICD-10-CM | POA: Diagnosis not present

## 2014-12-19 DIAGNOSIS — Z992 Dependence on renal dialysis: Secondary | ICD-10-CM | POA: Diagnosis not present

## 2014-12-19 DIAGNOSIS — N2581 Secondary hyperparathyroidism of renal origin: Secondary | ICD-10-CM | POA: Diagnosis not present

## 2014-12-19 DIAGNOSIS — D509 Iron deficiency anemia, unspecified: Secondary | ICD-10-CM | POA: Diagnosis not present

## 2014-12-19 DIAGNOSIS — D631 Anemia in chronic kidney disease: Secondary | ICD-10-CM | POA: Diagnosis not present

## 2014-12-19 DIAGNOSIS — J4520 Mild intermittent asthma, uncomplicated: Secondary | ICD-10-CM | POA: Diagnosis not present

## 2014-12-19 DIAGNOSIS — J069 Acute upper respiratory infection, unspecified: Secondary | ICD-10-CM | POA: Diagnosis not present

## 2014-12-19 DIAGNOSIS — M722 Plantar fascial fibromatosis: Secondary | ICD-10-CM | POA: Diagnosis not present

## 2014-12-19 DIAGNOSIS — N186 End stage renal disease: Secondary | ICD-10-CM | POA: Diagnosis not present

## 2014-12-20 DIAGNOSIS — N186 End stage renal disease: Secondary | ICD-10-CM | POA: Diagnosis not present

## 2014-12-21 DIAGNOSIS — D509 Iron deficiency anemia, unspecified: Secondary | ICD-10-CM | POA: Diagnosis not present

## 2014-12-21 DIAGNOSIS — N2581 Secondary hyperparathyroidism of renal origin: Secondary | ICD-10-CM | POA: Diagnosis not present

## 2014-12-21 DIAGNOSIS — Z992 Dependence on renal dialysis: Secondary | ICD-10-CM | POA: Diagnosis not present

## 2014-12-21 DIAGNOSIS — N186 End stage renal disease: Secondary | ICD-10-CM | POA: Diagnosis not present

## 2014-12-23 DIAGNOSIS — D509 Iron deficiency anemia, unspecified: Secondary | ICD-10-CM | POA: Diagnosis not present

## 2014-12-23 DIAGNOSIS — N186 End stage renal disease: Secondary | ICD-10-CM | POA: Diagnosis not present

## 2014-12-23 DIAGNOSIS — N2581 Secondary hyperparathyroidism of renal origin: Secondary | ICD-10-CM | POA: Diagnosis not present

## 2014-12-23 DIAGNOSIS — Z992 Dependence on renal dialysis: Secondary | ICD-10-CM | POA: Diagnosis not present

## 2014-12-25 DIAGNOSIS — I4891 Unspecified atrial fibrillation: Secondary | ICD-10-CM | POA: Diagnosis not present

## 2014-12-26 DIAGNOSIS — N2581 Secondary hyperparathyroidism of renal origin: Secondary | ICD-10-CM | POA: Diagnosis not present

## 2014-12-26 DIAGNOSIS — D509 Iron deficiency anemia, unspecified: Secondary | ICD-10-CM | POA: Diagnosis not present

## 2014-12-26 DIAGNOSIS — Z992 Dependence on renal dialysis: Secondary | ICD-10-CM | POA: Diagnosis not present

## 2014-12-26 DIAGNOSIS — N186 End stage renal disease: Secondary | ICD-10-CM | POA: Diagnosis not present

## 2014-12-26 DIAGNOSIS — D631 Anemia in chronic kidney disease: Secondary | ICD-10-CM | POA: Diagnosis not present

## 2014-12-28 DIAGNOSIS — N186 End stage renal disease: Secondary | ICD-10-CM | POA: Diagnosis not present

## 2014-12-28 DIAGNOSIS — N2581 Secondary hyperparathyroidism of renal origin: Secondary | ICD-10-CM | POA: Diagnosis not present

## 2014-12-28 DIAGNOSIS — D509 Iron deficiency anemia, unspecified: Secondary | ICD-10-CM | POA: Diagnosis not present

## 2014-12-28 DIAGNOSIS — E119 Type 2 diabetes mellitus without complications: Secondary | ICD-10-CM | POA: Diagnosis not present

## 2014-12-28 DIAGNOSIS — Z992 Dependence on renal dialysis: Secondary | ICD-10-CM | POA: Diagnosis not present

## 2014-12-28 DIAGNOSIS — D631 Anemia in chronic kidney disease: Secondary | ICD-10-CM | POA: Diagnosis not present

## 2014-12-30 DIAGNOSIS — N2581 Secondary hyperparathyroidism of renal origin: Secondary | ICD-10-CM | POA: Diagnosis not present

## 2014-12-30 DIAGNOSIS — D509 Iron deficiency anemia, unspecified: Secondary | ICD-10-CM | POA: Diagnosis not present

## 2014-12-30 DIAGNOSIS — Z992 Dependence on renal dialysis: Secondary | ICD-10-CM | POA: Diagnosis not present

## 2014-12-30 DIAGNOSIS — N186 End stage renal disease: Secondary | ICD-10-CM | POA: Diagnosis not present

## 2014-12-30 DIAGNOSIS — D631 Anemia in chronic kidney disease: Secondary | ICD-10-CM | POA: Diagnosis not present

## 2015-01-02 DIAGNOSIS — N2581 Secondary hyperparathyroidism of renal origin: Secondary | ICD-10-CM | POA: Diagnosis not present

## 2015-01-02 DIAGNOSIS — Z992 Dependence on renal dialysis: Secondary | ICD-10-CM | POA: Diagnosis not present

## 2015-01-02 DIAGNOSIS — D509 Iron deficiency anemia, unspecified: Secondary | ICD-10-CM | POA: Diagnosis not present

## 2015-01-02 DIAGNOSIS — M549 Dorsalgia, unspecified: Secondary | ICD-10-CM | POA: Diagnosis not present

## 2015-01-02 DIAGNOSIS — M9963 Osseous and subluxation stenosis of intervertebral foramina of lumbar region: Secondary | ICD-10-CM | POA: Diagnosis not present

## 2015-01-02 DIAGNOSIS — M4316 Spondylolisthesis, lumbar region: Secondary | ICD-10-CM | POA: Diagnosis not present

## 2015-01-02 DIAGNOSIS — D631 Anemia in chronic kidney disease: Secondary | ICD-10-CM | POA: Diagnosis not present

## 2015-01-02 DIAGNOSIS — N186 End stage renal disease: Secondary | ICD-10-CM | POA: Diagnosis not present

## 2015-01-04 DIAGNOSIS — Z992 Dependence on renal dialysis: Secondary | ICD-10-CM | POA: Diagnosis not present

## 2015-01-04 DIAGNOSIS — N2581 Secondary hyperparathyroidism of renal origin: Secondary | ICD-10-CM | POA: Diagnosis not present

## 2015-01-04 DIAGNOSIS — N186 End stage renal disease: Secondary | ICD-10-CM | POA: Diagnosis not present

## 2015-01-04 DIAGNOSIS — D509 Iron deficiency anemia, unspecified: Secondary | ICD-10-CM | POA: Diagnosis not present

## 2015-01-04 DIAGNOSIS — D631 Anemia in chronic kidney disease: Secondary | ICD-10-CM | POA: Diagnosis not present

## 2015-01-06 DIAGNOSIS — D631 Anemia in chronic kidney disease: Secondary | ICD-10-CM | POA: Diagnosis not present

## 2015-01-06 DIAGNOSIS — D509 Iron deficiency anemia, unspecified: Secondary | ICD-10-CM | POA: Diagnosis not present

## 2015-01-06 DIAGNOSIS — N186 End stage renal disease: Secondary | ICD-10-CM | POA: Diagnosis not present

## 2015-01-06 DIAGNOSIS — N2581 Secondary hyperparathyroidism of renal origin: Secondary | ICD-10-CM | POA: Diagnosis not present

## 2015-01-06 DIAGNOSIS — Z992 Dependence on renal dialysis: Secondary | ICD-10-CM | POA: Diagnosis not present

## 2015-01-08 DIAGNOSIS — I120 Hypertensive chronic kidney disease with stage 5 chronic kidney disease or end stage renal disease: Secondary | ICD-10-CM | POA: Diagnosis present

## 2015-01-08 DIAGNOSIS — D649 Anemia, unspecified: Secondary | ICD-10-CM | POA: Diagnosis present

## 2015-01-08 DIAGNOSIS — E669 Obesity, unspecified: Secondary | ICD-10-CM | POA: Diagnosis not present

## 2015-01-08 DIAGNOSIS — Z6839 Body mass index (BMI) 39.0-39.9, adult: Secondary | ICD-10-CM | POA: Diagnosis not present

## 2015-01-08 DIAGNOSIS — G4733 Obstructive sleep apnea (adult) (pediatric): Secondary | ICD-10-CM | POA: Diagnosis present

## 2015-01-08 DIAGNOSIS — N2581 Secondary hyperparathyroidism of renal origin: Secondary | ICD-10-CM | POA: Diagnosis present

## 2015-01-08 DIAGNOSIS — M5416 Radiculopathy, lumbar region: Secondary | ICD-10-CM | POA: Diagnosis present

## 2015-01-08 DIAGNOSIS — D631 Anemia in chronic kidney disease: Secondary | ICD-10-CM | POA: Diagnosis not present

## 2015-01-08 DIAGNOSIS — M4316 Spondylolisthesis, lumbar region: Secondary | ICD-10-CM | POA: Diagnosis not present

## 2015-01-08 DIAGNOSIS — N186 End stage renal disease: Secondary | ICD-10-CM | POA: Diagnosis not present

## 2015-01-08 DIAGNOSIS — J45909 Unspecified asthma, uncomplicated: Secondary | ICD-10-CM | POA: Diagnosis present

## 2015-01-08 DIAGNOSIS — Z992 Dependence on renal dialysis: Secondary | ICD-10-CM | POA: Diagnosis not present

## 2015-01-08 DIAGNOSIS — I10 Essential (primary) hypertension: Secondary | ICD-10-CM | POA: Diagnosis not present

## 2015-01-08 DIAGNOSIS — E559 Vitamin D deficiency, unspecified: Secondary | ICD-10-CM | POA: Diagnosis not present

## 2015-01-08 DIAGNOSIS — M9963 Osseous and subluxation stenosis of intervertebral foramina of lumbar region: Secondary | ICD-10-CM | POA: Diagnosis not present

## 2015-01-08 DIAGNOSIS — Z4789 Encounter for other orthopedic aftercare: Secondary | ICD-10-CM | POA: Diagnosis not present

## 2015-01-08 DIAGNOSIS — M5106 Intervertebral disc disorders with myelopathy, lumbar region: Secondary | ICD-10-CM | POA: Diagnosis present

## 2015-01-08 DIAGNOSIS — G959 Disease of spinal cord, unspecified: Secondary | ICD-10-CM | POA: Diagnosis not present

## 2015-01-08 DIAGNOSIS — M4806 Spinal stenosis, lumbar region: Secondary | ICD-10-CM | POA: Diagnosis not present

## 2015-01-08 DIAGNOSIS — E875 Hyperkalemia: Secondary | ICD-10-CM | POA: Diagnosis not present

## 2015-01-08 DIAGNOSIS — Z0000 Encounter for general adult medical examination without abnormal findings: Secondary | ICD-10-CM | POA: Diagnosis not present

## 2015-01-10 DIAGNOSIS — I10 Essential (primary) hypertension: Secondary | ICD-10-CM | POA: Diagnosis not present

## 2015-01-10 DIAGNOSIS — N184 Chronic kidney disease, stage 4 (severe): Secondary | ICD-10-CM | POA: Diagnosis not present

## 2015-01-10 DIAGNOSIS — Z981 Arthrodesis status: Secondary | ICD-10-CM | POA: Diagnosis not present

## 2015-01-10 DIAGNOSIS — M5106 Intervertebral disc disorders with myelopathy, lumbar region: Secondary | ICD-10-CM | POA: Diagnosis not present

## 2015-01-10 DIAGNOSIS — I120 Hypertensive chronic kidney disease with stage 5 chronic kidney disease or end stage renal disease: Secondary | ICD-10-CM | POA: Diagnosis present

## 2015-01-10 DIAGNOSIS — J45909 Unspecified asthma, uncomplicated: Secondary | ICD-10-CM | POA: Diagnosis present

## 2015-01-10 DIAGNOSIS — D631 Anemia in chronic kidney disease: Secondary | ICD-10-CM | POA: Diagnosis present

## 2015-01-10 DIAGNOSIS — K5900 Constipation, unspecified: Secondary | ICD-10-CM | POA: Diagnosis present

## 2015-01-10 DIAGNOSIS — Z992 Dependence on renal dialysis: Secondary | ICD-10-CM | POA: Diagnosis not present

## 2015-01-10 DIAGNOSIS — N179 Acute kidney failure, unspecified: Secondary | ICD-10-CM | POA: Diagnosis not present

## 2015-01-10 DIAGNOSIS — N186 End stage renal disease: Secondary | ICD-10-CM | POA: Diagnosis not present

## 2015-01-10 DIAGNOSIS — R531 Weakness: Secondary | ICD-10-CM | POA: Diagnosis present

## 2015-01-10 DIAGNOSIS — E611 Iron deficiency: Secondary | ICD-10-CM | POA: Diagnosis present

## 2015-01-10 DIAGNOSIS — N2581 Secondary hyperparathyroidism of renal origin: Secondary | ICD-10-CM | POA: Diagnosis not present

## 2015-01-10 DIAGNOSIS — R110 Nausea: Secondary | ICD-10-CM | POA: Diagnosis present

## 2015-01-10 DIAGNOSIS — Z4889 Encounter for other specified surgical aftercare: Secondary | ICD-10-CM | POA: Diagnosis present

## 2015-01-10 DIAGNOSIS — D649 Anemia, unspecified: Secondary | ICD-10-CM | POA: Diagnosis not present

## 2015-01-10 DIAGNOSIS — G8929 Other chronic pain: Secondary | ICD-10-CM | POA: Diagnosis present

## 2015-01-10 DIAGNOSIS — R739 Hyperglycemia, unspecified: Secondary | ICD-10-CM | POA: Diagnosis present

## 2015-01-10 DIAGNOSIS — B373 Candidiasis of vulva and vagina: Secondary | ICD-10-CM | POA: Diagnosis present

## 2015-01-10 DIAGNOSIS — Z880 Allergy status to penicillin: Secondary | ICD-10-CM | POA: Diagnosis not present

## 2015-01-10 DIAGNOSIS — R400 Somnolence: Secondary | ICD-10-CM | POA: Diagnosis present

## 2015-01-10 DIAGNOSIS — I959 Hypotension, unspecified: Secondary | ICD-10-CM | POA: Diagnosis not present

## 2015-01-10 DIAGNOSIS — Z6841 Body Mass Index (BMI) 40.0 and over, adult: Secondary | ICD-10-CM | POA: Diagnosis not present

## 2015-01-10 DIAGNOSIS — M4316 Spondylolisthesis, lumbar region: Secondary | ICD-10-CM | POA: Diagnosis not present

## 2015-01-10 DIAGNOSIS — I499 Cardiac arrhythmia, unspecified: Secondary | ICD-10-CM | POA: Diagnosis not present

## 2015-01-10 DIAGNOSIS — N390 Urinary tract infection, site not specified: Secondary | ICD-10-CM | POA: Diagnosis present

## 2015-01-10 DIAGNOSIS — E559 Vitamin D deficiency, unspecified: Secondary | ICD-10-CM | POA: Diagnosis not present

## 2015-01-25 DIAGNOSIS — Z992 Dependence on renal dialysis: Secondary | ICD-10-CM | POA: Diagnosis not present

## 2015-01-25 DIAGNOSIS — N2581 Secondary hyperparathyroidism of renal origin: Secondary | ICD-10-CM | POA: Diagnosis not present

## 2015-01-25 DIAGNOSIS — N186 End stage renal disease: Secondary | ICD-10-CM | POA: Diagnosis not present

## 2015-01-25 DIAGNOSIS — D509 Iron deficiency anemia, unspecified: Secondary | ICD-10-CM | POA: Diagnosis not present

## 2015-01-26 DIAGNOSIS — E213 Hyperparathyroidism, unspecified: Secondary | ICD-10-CM | POA: Diagnosis not present

## 2015-01-26 DIAGNOSIS — Z4889 Encounter for other specified surgical aftercare: Secondary | ICD-10-CM | POA: Diagnosis not present

## 2015-01-26 DIAGNOSIS — B373 Candidiasis of vulva and vagina: Secondary | ICD-10-CM | POA: Diagnosis not present

## 2015-01-26 DIAGNOSIS — R531 Weakness: Secondary | ICD-10-CM | POA: Diagnosis not present

## 2015-01-26 DIAGNOSIS — N186 End stage renal disease: Secondary | ICD-10-CM | POA: Diagnosis not present

## 2015-01-26 DIAGNOSIS — J45909 Unspecified asthma, uncomplicated: Secondary | ICD-10-CM | POA: Diagnosis not present

## 2015-01-26 DIAGNOSIS — I120 Hypertensive chronic kidney disease with stage 5 chronic kidney disease or end stage renal disease: Secondary | ICD-10-CM | POA: Diagnosis not present

## 2015-01-26 DIAGNOSIS — Z992 Dependence on renal dialysis: Secondary | ICD-10-CM | POA: Diagnosis not present

## 2015-01-27 DIAGNOSIS — Z992 Dependence on renal dialysis: Secondary | ICD-10-CM | POA: Diagnosis not present

## 2015-01-27 DIAGNOSIS — D509 Iron deficiency anemia, unspecified: Secondary | ICD-10-CM | POA: Diagnosis not present

## 2015-01-27 DIAGNOSIS — N186 End stage renal disease: Secondary | ICD-10-CM | POA: Diagnosis not present

## 2015-01-27 DIAGNOSIS — N2581 Secondary hyperparathyroidism of renal origin: Secondary | ICD-10-CM | POA: Diagnosis not present

## 2015-01-29 DIAGNOSIS — R531 Weakness: Secondary | ICD-10-CM | POA: Diagnosis not present

## 2015-01-29 DIAGNOSIS — B373 Candidiasis of vulva and vagina: Secondary | ICD-10-CM | POA: Diagnosis not present

## 2015-01-29 DIAGNOSIS — I4891 Unspecified atrial fibrillation: Secondary | ICD-10-CM | POA: Diagnosis not present

## 2015-01-29 DIAGNOSIS — Z992 Dependence on renal dialysis: Secondary | ICD-10-CM | POA: Diagnosis not present

## 2015-01-29 DIAGNOSIS — N186 End stage renal disease: Secondary | ICD-10-CM | POA: Diagnosis not present

## 2015-01-29 DIAGNOSIS — Z4889 Encounter for other specified surgical aftercare: Secondary | ICD-10-CM | POA: Diagnosis not present

## 2015-01-29 DIAGNOSIS — I120 Hypertensive chronic kidney disease with stage 5 chronic kidney disease or end stage renal disease: Secondary | ICD-10-CM | POA: Diagnosis not present

## 2015-01-30 DIAGNOSIS — D509 Iron deficiency anemia, unspecified: Secondary | ICD-10-CM | POA: Diagnosis not present

## 2015-01-30 DIAGNOSIS — Z992 Dependence on renal dialysis: Secondary | ICD-10-CM | POA: Diagnosis not present

## 2015-01-30 DIAGNOSIS — N186 End stage renal disease: Secondary | ICD-10-CM | POA: Diagnosis not present

## 2015-01-30 DIAGNOSIS — N2581 Secondary hyperparathyroidism of renal origin: Secondary | ICD-10-CM | POA: Diagnosis not present

## 2015-02-01 DIAGNOSIS — R531 Weakness: Secondary | ICD-10-CM | POA: Diagnosis not present

## 2015-02-01 DIAGNOSIS — Z4889 Encounter for other specified surgical aftercare: Secondary | ICD-10-CM | POA: Diagnosis not present

## 2015-02-01 DIAGNOSIS — I120 Hypertensive chronic kidney disease with stage 5 chronic kidney disease or end stage renal disease: Secondary | ICD-10-CM | POA: Diagnosis not present

## 2015-02-01 DIAGNOSIS — N186 End stage renal disease: Secondary | ICD-10-CM | POA: Diagnosis not present

## 2015-02-01 DIAGNOSIS — B373 Candidiasis of vulva and vagina: Secondary | ICD-10-CM | POA: Diagnosis not present

## 2015-02-01 DIAGNOSIS — D509 Iron deficiency anemia, unspecified: Secondary | ICD-10-CM | POA: Diagnosis not present

## 2015-02-01 DIAGNOSIS — N2581 Secondary hyperparathyroidism of renal origin: Secondary | ICD-10-CM | POA: Diagnosis not present

## 2015-02-01 DIAGNOSIS — Z992 Dependence on renal dialysis: Secondary | ICD-10-CM | POA: Diagnosis not present

## 2015-02-02 DIAGNOSIS — B373 Candidiasis of vulva and vagina: Secondary | ICD-10-CM | POA: Diagnosis not present

## 2015-02-02 DIAGNOSIS — R531 Weakness: Secondary | ICD-10-CM | POA: Diagnosis not present

## 2015-02-02 DIAGNOSIS — I120 Hypertensive chronic kidney disease with stage 5 chronic kidney disease or end stage renal disease: Secondary | ICD-10-CM | POA: Diagnosis not present

## 2015-02-02 DIAGNOSIS — Z4889 Encounter for other specified surgical aftercare: Secondary | ICD-10-CM | POA: Diagnosis not present

## 2015-02-02 DIAGNOSIS — N186 End stage renal disease: Secondary | ICD-10-CM | POA: Diagnosis not present

## 2015-02-02 DIAGNOSIS — Z992 Dependence on renal dialysis: Secondary | ICD-10-CM | POA: Diagnosis not present

## 2015-02-03 DIAGNOSIS — N2581 Secondary hyperparathyroidism of renal origin: Secondary | ICD-10-CM | POA: Diagnosis not present

## 2015-02-03 DIAGNOSIS — Z992 Dependence on renal dialysis: Secondary | ICD-10-CM | POA: Diagnosis not present

## 2015-02-03 DIAGNOSIS — D509 Iron deficiency anemia, unspecified: Secondary | ICD-10-CM | POA: Diagnosis not present

## 2015-02-03 DIAGNOSIS — N186 End stage renal disease: Secondary | ICD-10-CM | POA: Diagnosis not present

## 2015-02-05 DIAGNOSIS — I4892 Unspecified atrial flutter: Secondary | ICD-10-CM | POA: Diagnosis not present

## 2015-02-06 DIAGNOSIS — N2581 Secondary hyperparathyroidism of renal origin: Secondary | ICD-10-CM | POA: Diagnosis not present

## 2015-02-06 DIAGNOSIS — Z992 Dependence on renal dialysis: Secondary | ICD-10-CM | POA: Diagnosis not present

## 2015-02-06 DIAGNOSIS — N186 End stage renal disease: Secondary | ICD-10-CM | POA: Diagnosis not present

## 2015-02-06 DIAGNOSIS — D509 Iron deficiency anemia, unspecified: Secondary | ICD-10-CM | POA: Diagnosis not present

## 2015-02-07 DIAGNOSIS — M1990 Unspecified osteoarthritis, unspecified site: Secondary | ICD-10-CM | POA: Diagnosis not present

## 2015-02-07 DIAGNOSIS — M4806 Spinal stenosis, lumbar region: Secondary | ICD-10-CM | POA: Diagnosis not present

## 2015-02-07 DIAGNOSIS — Z981 Arthrodesis status: Secondary | ICD-10-CM | POA: Diagnosis not present

## 2015-02-07 DIAGNOSIS — I10 Essential (primary) hypertension: Secondary | ICD-10-CM | POA: Diagnosis not present

## 2015-02-08 DIAGNOSIS — N2581 Secondary hyperparathyroidism of renal origin: Secondary | ICD-10-CM | POA: Diagnosis not present

## 2015-02-08 DIAGNOSIS — Z992 Dependence on renal dialysis: Secondary | ICD-10-CM | POA: Diagnosis not present

## 2015-02-08 DIAGNOSIS — D509 Iron deficiency anemia, unspecified: Secondary | ICD-10-CM | POA: Diagnosis not present

## 2015-02-08 DIAGNOSIS — N186 End stage renal disease: Secondary | ICD-10-CM | POA: Diagnosis not present

## 2015-02-10 DIAGNOSIS — D509 Iron deficiency anemia, unspecified: Secondary | ICD-10-CM | POA: Diagnosis not present

## 2015-02-10 DIAGNOSIS — N2581 Secondary hyperparathyroidism of renal origin: Secondary | ICD-10-CM | POA: Diagnosis not present

## 2015-02-10 DIAGNOSIS — Z992 Dependence on renal dialysis: Secondary | ICD-10-CM | POA: Diagnosis not present

## 2015-02-10 DIAGNOSIS — N186 End stage renal disease: Secondary | ICD-10-CM | POA: Diagnosis not present

## 2015-02-12 DIAGNOSIS — M722 Plantar fascial fibromatosis: Secondary | ICD-10-CM | POA: Diagnosis not present

## 2015-02-12 DIAGNOSIS — M7662 Achilles tendinitis, left leg: Secondary | ICD-10-CM | POA: Diagnosis not present

## 2015-02-13 DIAGNOSIS — N186 End stage renal disease: Secondary | ICD-10-CM | POA: Diagnosis not present

## 2015-02-13 DIAGNOSIS — D509 Iron deficiency anemia, unspecified: Secondary | ICD-10-CM | POA: Diagnosis not present

## 2015-02-13 DIAGNOSIS — Z992 Dependence on renal dialysis: Secondary | ICD-10-CM | POA: Diagnosis not present

## 2015-02-13 DIAGNOSIS — N2581 Secondary hyperparathyroidism of renal origin: Secondary | ICD-10-CM | POA: Diagnosis not present

## 2015-02-14 DIAGNOSIS — M4316 Spondylolisthesis, lumbar region: Secondary | ICD-10-CM | POA: Diagnosis not present

## 2015-02-14 DIAGNOSIS — M9963 Osseous and subluxation stenosis of intervertebral foramina of lumbar region: Secondary | ICD-10-CM | POA: Diagnosis not present

## 2015-02-15 DIAGNOSIS — N2581 Secondary hyperparathyroidism of renal origin: Secondary | ICD-10-CM | POA: Diagnosis not present

## 2015-02-15 DIAGNOSIS — D509 Iron deficiency anemia, unspecified: Secondary | ICD-10-CM | POA: Diagnosis not present

## 2015-02-15 DIAGNOSIS — N186 End stage renal disease: Secondary | ICD-10-CM | POA: Diagnosis not present

## 2015-02-15 DIAGNOSIS — Z992 Dependence on renal dialysis: Secondary | ICD-10-CM | POA: Diagnosis not present

## 2015-02-16 DIAGNOSIS — M179 Osteoarthritis of knee, unspecified: Secondary | ICD-10-CM | POA: Diagnosis not present

## 2015-02-17 DIAGNOSIS — Z992 Dependence on renal dialysis: Secondary | ICD-10-CM | POA: Diagnosis not present

## 2015-02-17 DIAGNOSIS — N186 End stage renal disease: Secondary | ICD-10-CM | POA: Diagnosis not present

## 2015-02-17 DIAGNOSIS — N2581 Secondary hyperparathyroidism of renal origin: Secondary | ICD-10-CM | POA: Diagnosis not present

## 2015-02-17 DIAGNOSIS — D509 Iron deficiency anemia, unspecified: Secondary | ICD-10-CM | POA: Diagnosis not present

## 2015-02-18 DIAGNOSIS — N186 End stage renal disease: Secondary | ICD-10-CM | POA: Diagnosis not present

## 2015-02-19 DIAGNOSIS — I509 Heart failure, unspecified: Secondary | ICD-10-CM | POA: Diagnosis not present

## 2015-02-19 DIAGNOSIS — I4891 Unspecified atrial fibrillation: Secondary | ICD-10-CM | POA: Diagnosis not present

## 2015-02-20 DIAGNOSIS — D631 Anemia in chronic kidney disease: Secondary | ICD-10-CM | POA: Diagnosis not present

## 2015-02-20 DIAGNOSIS — N2581 Secondary hyperparathyroidism of renal origin: Secondary | ICD-10-CM | POA: Diagnosis not present

## 2015-02-20 DIAGNOSIS — Z992 Dependence on renal dialysis: Secondary | ICD-10-CM | POA: Diagnosis not present

## 2015-02-20 DIAGNOSIS — N186 End stage renal disease: Secondary | ICD-10-CM | POA: Diagnosis not present

## 2015-02-20 DIAGNOSIS — D509 Iron deficiency anemia, unspecified: Secondary | ICD-10-CM | POA: Diagnosis not present

## 2015-02-22 DIAGNOSIS — D509 Iron deficiency anemia, unspecified: Secondary | ICD-10-CM | POA: Diagnosis not present

## 2015-02-22 DIAGNOSIS — D631 Anemia in chronic kidney disease: Secondary | ICD-10-CM | POA: Diagnosis not present

## 2015-02-22 DIAGNOSIS — N2581 Secondary hyperparathyroidism of renal origin: Secondary | ICD-10-CM | POA: Diagnosis not present

## 2015-02-22 DIAGNOSIS — N186 End stage renal disease: Secondary | ICD-10-CM | POA: Diagnosis not present

## 2015-02-22 DIAGNOSIS — Z992 Dependence on renal dialysis: Secondary | ICD-10-CM | POA: Diagnosis not present

## 2015-02-24 DIAGNOSIS — D631 Anemia in chronic kidney disease: Secondary | ICD-10-CM | POA: Diagnosis not present

## 2015-02-24 DIAGNOSIS — N186 End stage renal disease: Secondary | ICD-10-CM | POA: Diagnosis not present

## 2015-02-24 DIAGNOSIS — Z992 Dependence on renal dialysis: Secondary | ICD-10-CM | POA: Diagnosis not present

## 2015-02-24 DIAGNOSIS — N2581 Secondary hyperparathyroidism of renal origin: Secondary | ICD-10-CM | POA: Diagnosis not present

## 2015-02-24 DIAGNOSIS — D509 Iron deficiency anemia, unspecified: Secondary | ICD-10-CM | POA: Diagnosis not present

## 2015-02-27 DIAGNOSIS — D509 Iron deficiency anemia, unspecified: Secondary | ICD-10-CM | POA: Diagnosis not present

## 2015-02-27 DIAGNOSIS — Z992 Dependence on renal dialysis: Secondary | ICD-10-CM | POA: Diagnosis not present

## 2015-02-27 DIAGNOSIS — N2581 Secondary hyperparathyroidism of renal origin: Secondary | ICD-10-CM | POA: Diagnosis not present

## 2015-02-27 DIAGNOSIS — N186 End stage renal disease: Secondary | ICD-10-CM | POA: Diagnosis not present

## 2015-02-27 DIAGNOSIS — D631 Anemia in chronic kidney disease: Secondary | ICD-10-CM | POA: Diagnosis not present

## 2015-03-01 DIAGNOSIS — D631 Anemia in chronic kidney disease: Secondary | ICD-10-CM | POA: Diagnosis not present

## 2015-03-01 DIAGNOSIS — Z992 Dependence on renal dialysis: Secondary | ICD-10-CM | POA: Diagnosis not present

## 2015-03-01 DIAGNOSIS — N186 End stage renal disease: Secondary | ICD-10-CM | POA: Diagnosis not present

## 2015-03-01 DIAGNOSIS — D509 Iron deficiency anemia, unspecified: Secondary | ICD-10-CM | POA: Diagnosis not present

## 2015-03-01 DIAGNOSIS — N2581 Secondary hyperparathyroidism of renal origin: Secondary | ICD-10-CM | POA: Diagnosis not present

## 2015-03-03 DIAGNOSIS — N2581 Secondary hyperparathyroidism of renal origin: Secondary | ICD-10-CM | POA: Diagnosis not present

## 2015-03-03 DIAGNOSIS — N186 End stage renal disease: Secondary | ICD-10-CM | POA: Diagnosis not present

## 2015-03-03 DIAGNOSIS — D631 Anemia in chronic kidney disease: Secondary | ICD-10-CM | POA: Diagnosis not present

## 2015-03-03 DIAGNOSIS — D509 Iron deficiency anemia, unspecified: Secondary | ICD-10-CM | POA: Diagnosis not present

## 2015-03-03 DIAGNOSIS — Z992 Dependence on renal dialysis: Secondary | ICD-10-CM | POA: Diagnosis not present

## 2015-03-05 DIAGNOSIS — T82858A Stenosis of vascular prosthetic devices, implants and grafts, initial encounter: Secondary | ICD-10-CM | POA: Diagnosis not present

## 2015-03-05 DIAGNOSIS — N186 End stage renal disease: Secondary | ICD-10-CM | POA: Diagnosis not present

## 2015-03-05 DIAGNOSIS — I871 Compression of vein: Secondary | ICD-10-CM | POA: Diagnosis not present

## 2015-03-05 DIAGNOSIS — Z992 Dependence on renal dialysis: Secondary | ICD-10-CM | POA: Diagnosis not present

## 2015-03-06 DIAGNOSIS — N2581 Secondary hyperparathyroidism of renal origin: Secondary | ICD-10-CM | POA: Diagnosis not present

## 2015-03-06 DIAGNOSIS — N186 End stage renal disease: Secondary | ICD-10-CM | POA: Diagnosis not present

## 2015-03-06 DIAGNOSIS — D631 Anemia in chronic kidney disease: Secondary | ICD-10-CM | POA: Diagnosis not present

## 2015-03-06 DIAGNOSIS — Z992 Dependence on renal dialysis: Secondary | ICD-10-CM | POA: Diagnosis not present

## 2015-03-06 DIAGNOSIS — D509 Iron deficiency anemia, unspecified: Secondary | ICD-10-CM | POA: Diagnosis not present

## 2015-03-08 DIAGNOSIS — N2581 Secondary hyperparathyroidism of renal origin: Secondary | ICD-10-CM | POA: Diagnosis not present

## 2015-03-08 DIAGNOSIS — D631 Anemia in chronic kidney disease: Secondary | ICD-10-CM | POA: Diagnosis not present

## 2015-03-08 DIAGNOSIS — D509 Iron deficiency anemia, unspecified: Secondary | ICD-10-CM | POA: Diagnosis not present

## 2015-03-08 DIAGNOSIS — Z992 Dependence on renal dialysis: Secondary | ICD-10-CM | POA: Diagnosis not present

## 2015-03-08 DIAGNOSIS — N186 End stage renal disease: Secondary | ICD-10-CM | POA: Diagnosis not present

## 2015-03-10 DIAGNOSIS — N186 End stage renal disease: Secondary | ICD-10-CM | POA: Diagnosis not present

## 2015-03-10 DIAGNOSIS — N2581 Secondary hyperparathyroidism of renal origin: Secondary | ICD-10-CM | POA: Diagnosis not present

## 2015-03-10 DIAGNOSIS — D509 Iron deficiency anemia, unspecified: Secondary | ICD-10-CM | POA: Diagnosis not present

## 2015-03-10 DIAGNOSIS — D631 Anemia in chronic kidney disease: Secondary | ICD-10-CM | POA: Diagnosis not present

## 2015-03-10 DIAGNOSIS — Z992 Dependence on renal dialysis: Secondary | ICD-10-CM | POA: Diagnosis not present

## 2015-03-12 DIAGNOSIS — T82898A Other specified complication of vascular prosthetic devices, implants and grafts, initial encounter: Secondary | ICD-10-CM | POA: Diagnosis not present

## 2015-03-12 DIAGNOSIS — Z992 Dependence on renal dialysis: Secondary | ICD-10-CM | POA: Diagnosis not present

## 2015-03-12 DIAGNOSIS — N186 End stage renal disease: Secondary | ICD-10-CM | POA: Diagnosis not present

## 2015-03-13 DIAGNOSIS — Z992 Dependence on renal dialysis: Secondary | ICD-10-CM | POA: Diagnosis not present

## 2015-03-13 DIAGNOSIS — N2581 Secondary hyperparathyroidism of renal origin: Secondary | ICD-10-CM | POA: Diagnosis not present

## 2015-03-13 DIAGNOSIS — D631 Anemia in chronic kidney disease: Secondary | ICD-10-CM | POA: Diagnosis not present

## 2015-03-13 DIAGNOSIS — N186 End stage renal disease: Secondary | ICD-10-CM | POA: Diagnosis not present

## 2015-03-13 DIAGNOSIS — D509 Iron deficiency anemia, unspecified: Secondary | ICD-10-CM | POA: Diagnosis not present

## 2015-03-14 DIAGNOSIS — I4891 Unspecified atrial fibrillation: Secondary | ICD-10-CM | POA: Diagnosis not present

## 2015-03-15 DIAGNOSIS — Z992 Dependence on renal dialysis: Secondary | ICD-10-CM | POA: Diagnosis not present

## 2015-03-15 DIAGNOSIS — D509 Iron deficiency anemia, unspecified: Secondary | ICD-10-CM | POA: Diagnosis not present

## 2015-03-15 DIAGNOSIS — D631 Anemia in chronic kidney disease: Secondary | ICD-10-CM | POA: Diagnosis not present

## 2015-03-15 DIAGNOSIS — N2581 Secondary hyperparathyroidism of renal origin: Secondary | ICD-10-CM | POA: Diagnosis not present

## 2015-03-15 DIAGNOSIS — N186 End stage renal disease: Secondary | ICD-10-CM | POA: Diagnosis not present

## 2015-03-17 DIAGNOSIS — D631 Anemia in chronic kidney disease: Secondary | ICD-10-CM | POA: Diagnosis not present

## 2015-03-17 DIAGNOSIS — N186 End stage renal disease: Secondary | ICD-10-CM | POA: Diagnosis not present

## 2015-03-17 DIAGNOSIS — D509 Iron deficiency anemia, unspecified: Secondary | ICD-10-CM | POA: Diagnosis not present

## 2015-03-17 DIAGNOSIS — N2581 Secondary hyperparathyroidism of renal origin: Secondary | ICD-10-CM | POA: Diagnosis not present

## 2015-03-17 DIAGNOSIS — Z992 Dependence on renal dialysis: Secondary | ICD-10-CM | POA: Diagnosis not present

## 2015-03-20 DIAGNOSIS — D631 Anemia in chronic kidney disease: Secondary | ICD-10-CM | POA: Diagnosis not present

## 2015-03-20 DIAGNOSIS — D509 Iron deficiency anemia, unspecified: Secondary | ICD-10-CM | POA: Diagnosis not present

## 2015-03-20 DIAGNOSIS — N2581 Secondary hyperparathyroidism of renal origin: Secondary | ICD-10-CM | POA: Diagnosis not present

## 2015-03-20 DIAGNOSIS — Z992 Dependence on renal dialysis: Secondary | ICD-10-CM | POA: Diagnosis not present

## 2015-03-20 DIAGNOSIS — N186 End stage renal disease: Secondary | ICD-10-CM | POA: Diagnosis not present

## 2015-03-21 DIAGNOSIS — N186 End stage renal disease: Secondary | ICD-10-CM | POA: Diagnosis not present

## 2015-03-21 DIAGNOSIS — I4891 Unspecified atrial fibrillation: Secondary | ICD-10-CM | POA: Diagnosis not present

## 2015-03-22 DIAGNOSIS — Z992 Dependence on renal dialysis: Secondary | ICD-10-CM | POA: Diagnosis not present

## 2015-03-22 DIAGNOSIS — D509 Iron deficiency anemia, unspecified: Secondary | ICD-10-CM | POA: Diagnosis not present

## 2015-03-22 DIAGNOSIS — D631 Anemia in chronic kidney disease: Secondary | ICD-10-CM | POA: Diagnosis not present

## 2015-03-22 DIAGNOSIS — N186 End stage renal disease: Secondary | ICD-10-CM | POA: Diagnosis not present

## 2015-03-22 DIAGNOSIS — Z23 Encounter for immunization: Secondary | ICD-10-CM | POA: Diagnosis not present

## 2015-03-22 DIAGNOSIS — N2581 Secondary hyperparathyroidism of renal origin: Secondary | ICD-10-CM | POA: Diagnosis not present

## 2015-03-24 DIAGNOSIS — Z23 Encounter for immunization: Secondary | ICD-10-CM | POA: Diagnosis not present

## 2015-03-24 DIAGNOSIS — N186 End stage renal disease: Secondary | ICD-10-CM | POA: Diagnosis not present

## 2015-03-24 DIAGNOSIS — D631 Anemia in chronic kidney disease: Secondary | ICD-10-CM | POA: Diagnosis not present

## 2015-03-24 DIAGNOSIS — Z992 Dependence on renal dialysis: Secondary | ICD-10-CM | POA: Diagnosis not present

## 2015-03-24 DIAGNOSIS — N2581 Secondary hyperparathyroidism of renal origin: Secondary | ICD-10-CM | POA: Diagnosis not present

## 2015-03-24 DIAGNOSIS — D509 Iron deficiency anemia, unspecified: Secondary | ICD-10-CM | POA: Diagnosis not present

## 2015-03-27 DIAGNOSIS — N2581 Secondary hyperparathyroidism of renal origin: Secondary | ICD-10-CM | POA: Diagnosis not present

## 2015-03-27 DIAGNOSIS — Z23 Encounter for immunization: Secondary | ICD-10-CM | POA: Diagnosis not present

## 2015-03-27 DIAGNOSIS — Z992 Dependence on renal dialysis: Secondary | ICD-10-CM | POA: Diagnosis not present

## 2015-03-27 DIAGNOSIS — N186 End stage renal disease: Secondary | ICD-10-CM | POA: Diagnosis not present

## 2015-03-27 DIAGNOSIS — D631 Anemia in chronic kidney disease: Secondary | ICD-10-CM | POA: Diagnosis not present

## 2015-03-27 DIAGNOSIS — D509 Iron deficiency anemia, unspecified: Secondary | ICD-10-CM | POA: Diagnosis not present

## 2015-03-28 DIAGNOSIS — M4316 Spondylolisthesis, lumbar region: Secondary | ICD-10-CM | POA: Diagnosis not present

## 2015-03-28 DIAGNOSIS — M9963 Osseous and subluxation stenosis of intervertebral foramina of lumbar region: Secondary | ICD-10-CM | POA: Diagnosis not present

## 2015-03-29 DIAGNOSIS — Z23 Encounter for immunization: Secondary | ICD-10-CM | POA: Diagnosis not present

## 2015-03-29 DIAGNOSIS — D631 Anemia in chronic kidney disease: Secondary | ICD-10-CM | POA: Diagnosis not present

## 2015-03-29 DIAGNOSIS — N2581 Secondary hyperparathyroidism of renal origin: Secondary | ICD-10-CM | POA: Diagnosis not present

## 2015-03-29 DIAGNOSIS — N186 End stage renal disease: Secondary | ICD-10-CM | POA: Diagnosis not present

## 2015-03-29 DIAGNOSIS — Z992 Dependence on renal dialysis: Secondary | ICD-10-CM | POA: Diagnosis not present

## 2015-03-29 DIAGNOSIS — D509 Iron deficiency anemia, unspecified: Secondary | ICD-10-CM | POA: Diagnosis not present

## 2015-03-31 DIAGNOSIS — D509 Iron deficiency anemia, unspecified: Secondary | ICD-10-CM | POA: Diagnosis not present

## 2015-03-31 DIAGNOSIS — Z992 Dependence on renal dialysis: Secondary | ICD-10-CM | POA: Diagnosis not present

## 2015-03-31 DIAGNOSIS — N2581 Secondary hyperparathyroidism of renal origin: Secondary | ICD-10-CM | POA: Diagnosis not present

## 2015-03-31 DIAGNOSIS — Z23 Encounter for immunization: Secondary | ICD-10-CM | POA: Diagnosis not present

## 2015-03-31 DIAGNOSIS — N186 End stage renal disease: Secondary | ICD-10-CM | POA: Diagnosis not present

## 2015-03-31 DIAGNOSIS — D631 Anemia in chronic kidney disease: Secondary | ICD-10-CM | POA: Diagnosis not present

## 2015-04-03 DIAGNOSIS — Z23 Encounter for immunization: Secondary | ICD-10-CM | POA: Diagnosis not present

## 2015-04-03 DIAGNOSIS — N186 End stage renal disease: Secondary | ICD-10-CM | POA: Diagnosis not present

## 2015-04-03 DIAGNOSIS — D509 Iron deficiency anemia, unspecified: Secondary | ICD-10-CM | POA: Diagnosis not present

## 2015-04-03 DIAGNOSIS — D631 Anemia in chronic kidney disease: Secondary | ICD-10-CM | POA: Diagnosis not present

## 2015-04-03 DIAGNOSIS — N2581 Secondary hyperparathyroidism of renal origin: Secondary | ICD-10-CM | POA: Diagnosis not present

## 2015-04-03 DIAGNOSIS — Z992 Dependence on renal dialysis: Secondary | ICD-10-CM | POA: Diagnosis not present

## 2015-04-04 DIAGNOSIS — I4891 Unspecified atrial fibrillation: Secondary | ICD-10-CM | POA: Diagnosis not present

## 2015-04-05 DIAGNOSIS — Z992 Dependence on renal dialysis: Secondary | ICD-10-CM | POA: Diagnosis not present

## 2015-04-05 DIAGNOSIS — N186 End stage renal disease: Secondary | ICD-10-CM | POA: Diagnosis not present

## 2015-04-05 DIAGNOSIS — D509 Iron deficiency anemia, unspecified: Secondary | ICD-10-CM | POA: Diagnosis not present

## 2015-04-05 DIAGNOSIS — Z23 Encounter for immunization: Secondary | ICD-10-CM | POA: Diagnosis not present

## 2015-04-05 DIAGNOSIS — D631 Anemia in chronic kidney disease: Secondary | ICD-10-CM | POA: Diagnosis not present

## 2015-04-05 DIAGNOSIS — N2581 Secondary hyperparathyroidism of renal origin: Secondary | ICD-10-CM | POA: Diagnosis not present

## 2015-04-07 DIAGNOSIS — D631 Anemia in chronic kidney disease: Secondary | ICD-10-CM | POA: Diagnosis not present

## 2015-04-07 DIAGNOSIS — N186 End stage renal disease: Secondary | ICD-10-CM | POA: Diagnosis not present

## 2015-04-07 DIAGNOSIS — D509 Iron deficiency anemia, unspecified: Secondary | ICD-10-CM | POA: Diagnosis not present

## 2015-04-07 DIAGNOSIS — N2581 Secondary hyperparathyroidism of renal origin: Secondary | ICD-10-CM | POA: Diagnosis not present

## 2015-04-07 DIAGNOSIS — Z992 Dependence on renal dialysis: Secondary | ICD-10-CM | POA: Diagnosis not present

## 2015-04-07 DIAGNOSIS — Z23 Encounter for immunization: Secondary | ICD-10-CM | POA: Diagnosis not present

## 2015-04-10 DIAGNOSIS — N186 End stage renal disease: Secondary | ICD-10-CM | POA: Diagnosis not present

## 2015-04-10 DIAGNOSIS — D631 Anemia in chronic kidney disease: Secondary | ICD-10-CM | POA: Diagnosis not present

## 2015-04-10 DIAGNOSIS — N2581 Secondary hyperparathyroidism of renal origin: Secondary | ICD-10-CM | POA: Diagnosis not present

## 2015-04-10 DIAGNOSIS — D509 Iron deficiency anemia, unspecified: Secondary | ICD-10-CM | POA: Diagnosis not present

## 2015-04-10 DIAGNOSIS — Z23 Encounter for immunization: Secondary | ICD-10-CM | POA: Diagnosis not present

## 2015-04-10 DIAGNOSIS — Z992 Dependence on renal dialysis: Secondary | ICD-10-CM | POA: Diagnosis not present

## 2015-04-12 DIAGNOSIS — N186 End stage renal disease: Secondary | ICD-10-CM | POA: Diagnosis not present

## 2015-04-12 DIAGNOSIS — D509 Iron deficiency anemia, unspecified: Secondary | ICD-10-CM | POA: Diagnosis not present

## 2015-04-12 DIAGNOSIS — D631 Anemia in chronic kidney disease: Secondary | ICD-10-CM | POA: Diagnosis not present

## 2015-04-12 DIAGNOSIS — N2581 Secondary hyperparathyroidism of renal origin: Secondary | ICD-10-CM | POA: Diagnosis not present

## 2015-04-12 DIAGNOSIS — Z992 Dependence on renal dialysis: Secondary | ICD-10-CM | POA: Diagnosis not present

## 2015-04-12 DIAGNOSIS — Z23 Encounter for immunization: Secondary | ICD-10-CM | POA: Diagnosis not present

## 2015-04-14 DIAGNOSIS — Z992 Dependence on renal dialysis: Secondary | ICD-10-CM | POA: Diagnosis not present

## 2015-04-14 DIAGNOSIS — D631 Anemia in chronic kidney disease: Secondary | ICD-10-CM | POA: Diagnosis not present

## 2015-04-14 DIAGNOSIS — Z23 Encounter for immunization: Secondary | ICD-10-CM | POA: Diagnosis not present

## 2015-04-14 DIAGNOSIS — N186 End stage renal disease: Secondary | ICD-10-CM | POA: Diagnosis not present

## 2015-04-14 DIAGNOSIS — N2581 Secondary hyperparathyroidism of renal origin: Secondary | ICD-10-CM | POA: Diagnosis not present

## 2015-04-14 DIAGNOSIS — D509 Iron deficiency anemia, unspecified: Secondary | ICD-10-CM | POA: Diagnosis not present

## 2015-04-16 DIAGNOSIS — I770 Arteriovenous fistula, acquired: Secondary | ICD-10-CM | POA: Diagnosis not present

## 2015-04-17 DIAGNOSIS — I739 Peripheral vascular disease, unspecified: Secondary | ICD-10-CM | POA: Diagnosis not present

## 2015-04-17 DIAGNOSIS — T82868A Thrombosis of vascular prosthetic devices, implants and grafts, initial encounter: Secondary | ICD-10-CM | POA: Diagnosis not present

## 2015-04-18 DIAGNOSIS — N186 End stage renal disease: Secondary | ICD-10-CM | POA: Diagnosis not present

## 2015-04-18 DIAGNOSIS — D631 Anemia in chronic kidney disease: Secondary | ICD-10-CM | POA: Diagnosis not present

## 2015-04-18 DIAGNOSIS — Z01818 Encounter for other preprocedural examination: Secondary | ICD-10-CM | POA: Diagnosis not present

## 2015-04-18 DIAGNOSIS — Z23 Encounter for immunization: Secondary | ICD-10-CM | POA: Diagnosis not present

## 2015-04-18 DIAGNOSIS — N2581 Secondary hyperparathyroidism of renal origin: Secondary | ICD-10-CM | POA: Diagnosis not present

## 2015-04-18 DIAGNOSIS — D509 Iron deficiency anemia, unspecified: Secondary | ICD-10-CM | POA: Diagnosis not present

## 2015-04-18 DIAGNOSIS — Z01812 Encounter for preprocedural laboratory examination: Secondary | ICD-10-CM | POA: Diagnosis not present

## 2015-04-18 DIAGNOSIS — Z992 Dependence on renal dialysis: Secondary | ICD-10-CM | POA: Diagnosis not present

## 2015-04-19 DIAGNOSIS — T82868A Thrombosis of vascular prosthetic devices, implants and grafts, initial encounter: Secondary | ICD-10-CM | POA: Diagnosis not present

## 2015-04-19 DIAGNOSIS — T82858A Stenosis of vascular prosthetic devices, implants and grafts, initial encounter: Secondary | ICD-10-CM | POA: Diagnosis not present

## 2015-04-20 DIAGNOSIS — Z23 Encounter for immunization: Secondary | ICD-10-CM | POA: Diagnosis not present

## 2015-04-20 DIAGNOSIS — D509 Iron deficiency anemia, unspecified: Secondary | ICD-10-CM | POA: Diagnosis not present

## 2015-04-20 DIAGNOSIS — N2581 Secondary hyperparathyroidism of renal origin: Secondary | ICD-10-CM | POA: Diagnosis not present

## 2015-04-20 DIAGNOSIS — D631 Anemia in chronic kidney disease: Secondary | ICD-10-CM | POA: Diagnosis not present

## 2015-04-20 DIAGNOSIS — Z992 Dependence on renal dialysis: Secondary | ICD-10-CM | POA: Diagnosis not present

## 2015-04-20 DIAGNOSIS — N186 End stage renal disease: Secondary | ICD-10-CM | POA: Diagnosis not present

## 2015-04-21 DIAGNOSIS — N2581 Secondary hyperparathyroidism of renal origin: Secondary | ICD-10-CM | POA: Diagnosis not present

## 2015-04-21 DIAGNOSIS — D509 Iron deficiency anemia, unspecified: Secondary | ICD-10-CM | POA: Diagnosis not present

## 2015-04-21 DIAGNOSIS — Z992 Dependence on renal dialysis: Secondary | ICD-10-CM | POA: Diagnosis not present

## 2015-04-21 DIAGNOSIS — N186 End stage renal disease: Secondary | ICD-10-CM | POA: Diagnosis not present

## 2015-04-21 DIAGNOSIS — D631 Anemia in chronic kidney disease: Secondary | ICD-10-CM | POA: Diagnosis not present

## 2015-04-24 DIAGNOSIS — Z992 Dependence on renal dialysis: Secondary | ICD-10-CM | POA: Diagnosis not present

## 2015-04-24 DIAGNOSIS — D631 Anemia in chronic kidney disease: Secondary | ICD-10-CM | POA: Diagnosis not present

## 2015-04-24 DIAGNOSIS — N186 End stage renal disease: Secondary | ICD-10-CM | POA: Diagnosis not present

## 2015-04-24 DIAGNOSIS — D509 Iron deficiency anemia, unspecified: Secondary | ICD-10-CM | POA: Diagnosis not present

## 2015-04-24 DIAGNOSIS — N2581 Secondary hyperparathyroidism of renal origin: Secondary | ICD-10-CM | POA: Diagnosis not present

## 2015-04-25 DIAGNOSIS — I4891 Unspecified atrial fibrillation: Secondary | ICD-10-CM | POA: Diagnosis not present

## 2015-04-26 DIAGNOSIS — D509 Iron deficiency anemia, unspecified: Secondary | ICD-10-CM | POA: Diagnosis not present

## 2015-04-26 DIAGNOSIS — Z992 Dependence on renal dialysis: Secondary | ICD-10-CM | POA: Diagnosis not present

## 2015-04-26 DIAGNOSIS — N2581 Secondary hyperparathyroidism of renal origin: Secondary | ICD-10-CM | POA: Diagnosis not present

## 2015-04-26 DIAGNOSIS — D631 Anemia in chronic kidney disease: Secondary | ICD-10-CM | POA: Diagnosis not present

## 2015-04-26 DIAGNOSIS — N186 End stage renal disease: Secondary | ICD-10-CM | POA: Diagnosis not present

## 2015-04-28 DIAGNOSIS — D509 Iron deficiency anemia, unspecified: Secondary | ICD-10-CM | POA: Diagnosis not present

## 2015-04-28 DIAGNOSIS — Z992 Dependence on renal dialysis: Secondary | ICD-10-CM | POA: Diagnosis not present

## 2015-04-28 DIAGNOSIS — D631 Anemia in chronic kidney disease: Secondary | ICD-10-CM | POA: Diagnosis not present

## 2015-04-28 DIAGNOSIS — N2581 Secondary hyperparathyroidism of renal origin: Secondary | ICD-10-CM | POA: Diagnosis not present

## 2015-04-28 DIAGNOSIS — N186 End stage renal disease: Secondary | ICD-10-CM | POA: Diagnosis not present

## 2015-05-01 DIAGNOSIS — D509 Iron deficiency anemia, unspecified: Secondary | ICD-10-CM | POA: Diagnosis not present

## 2015-05-01 DIAGNOSIS — Z992 Dependence on renal dialysis: Secondary | ICD-10-CM | POA: Diagnosis not present

## 2015-05-01 DIAGNOSIS — N2581 Secondary hyperparathyroidism of renal origin: Secondary | ICD-10-CM | POA: Diagnosis not present

## 2015-05-01 DIAGNOSIS — D631 Anemia in chronic kidney disease: Secondary | ICD-10-CM | POA: Diagnosis not present

## 2015-05-01 DIAGNOSIS — N186 End stage renal disease: Secondary | ICD-10-CM | POA: Diagnosis not present

## 2015-05-02 DIAGNOSIS — I770 Arteriovenous fistula, acquired: Secondary | ICD-10-CM | POA: Diagnosis not present

## 2015-05-02 DIAGNOSIS — N186 End stage renal disease: Secondary | ICD-10-CM | POA: Diagnosis not present

## 2015-05-02 DIAGNOSIS — Z992 Dependence on renal dialysis: Secondary | ICD-10-CM | POA: Diagnosis not present

## 2015-05-02 DIAGNOSIS — T82858S Stenosis of vascular prosthetic devices, implants and grafts, sequela: Secondary | ICD-10-CM | POA: Diagnosis not present

## 2015-05-02 DIAGNOSIS — I739 Peripheral vascular disease, unspecified: Secondary | ICD-10-CM | POA: Diagnosis not present

## 2015-05-03 DIAGNOSIS — D631 Anemia in chronic kidney disease: Secondary | ICD-10-CM | POA: Diagnosis not present

## 2015-05-03 DIAGNOSIS — N2581 Secondary hyperparathyroidism of renal origin: Secondary | ICD-10-CM | POA: Diagnosis not present

## 2015-05-03 DIAGNOSIS — N186 End stage renal disease: Secondary | ICD-10-CM | POA: Diagnosis not present

## 2015-05-03 DIAGNOSIS — D509 Iron deficiency anemia, unspecified: Secondary | ICD-10-CM | POA: Diagnosis not present

## 2015-05-03 DIAGNOSIS — Z992 Dependence on renal dialysis: Secondary | ICD-10-CM | POA: Diagnosis not present

## 2015-05-05 DIAGNOSIS — D509 Iron deficiency anemia, unspecified: Secondary | ICD-10-CM | POA: Diagnosis not present

## 2015-05-05 DIAGNOSIS — D631 Anemia in chronic kidney disease: Secondary | ICD-10-CM | POA: Diagnosis not present

## 2015-05-05 DIAGNOSIS — Z992 Dependence on renal dialysis: Secondary | ICD-10-CM | POA: Diagnosis not present

## 2015-05-05 DIAGNOSIS — N2581 Secondary hyperparathyroidism of renal origin: Secondary | ICD-10-CM | POA: Diagnosis not present

## 2015-05-05 DIAGNOSIS — N186 End stage renal disease: Secondary | ICD-10-CM | POA: Diagnosis not present

## 2015-05-08 DIAGNOSIS — D509 Iron deficiency anemia, unspecified: Secondary | ICD-10-CM | POA: Diagnosis not present

## 2015-05-08 DIAGNOSIS — Z992 Dependence on renal dialysis: Secondary | ICD-10-CM | POA: Diagnosis not present

## 2015-05-08 DIAGNOSIS — N186 End stage renal disease: Secondary | ICD-10-CM | POA: Diagnosis not present

## 2015-05-08 DIAGNOSIS — N2581 Secondary hyperparathyroidism of renal origin: Secondary | ICD-10-CM | POA: Diagnosis not present

## 2015-05-08 DIAGNOSIS — D631 Anemia in chronic kidney disease: Secondary | ICD-10-CM | POA: Diagnosis not present

## 2015-05-09 DIAGNOSIS — I4891 Unspecified atrial fibrillation: Secondary | ICD-10-CM | POA: Diagnosis not present

## 2015-05-10 DIAGNOSIS — Z992 Dependence on renal dialysis: Secondary | ICD-10-CM | POA: Diagnosis not present

## 2015-05-10 DIAGNOSIS — D509 Iron deficiency anemia, unspecified: Secondary | ICD-10-CM | POA: Diagnosis not present

## 2015-05-10 DIAGNOSIS — N186 End stage renal disease: Secondary | ICD-10-CM | POA: Diagnosis not present

## 2015-05-10 DIAGNOSIS — N2581 Secondary hyperparathyroidism of renal origin: Secondary | ICD-10-CM | POA: Diagnosis not present

## 2015-05-10 DIAGNOSIS — D631 Anemia in chronic kidney disease: Secondary | ICD-10-CM | POA: Diagnosis not present

## 2015-05-12 DIAGNOSIS — N2581 Secondary hyperparathyroidism of renal origin: Secondary | ICD-10-CM | POA: Diagnosis not present

## 2015-05-12 DIAGNOSIS — D509 Iron deficiency anemia, unspecified: Secondary | ICD-10-CM | POA: Diagnosis not present

## 2015-05-12 DIAGNOSIS — D631 Anemia in chronic kidney disease: Secondary | ICD-10-CM | POA: Diagnosis not present

## 2015-05-12 DIAGNOSIS — Z992 Dependence on renal dialysis: Secondary | ICD-10-CM | POA: Diagnosis not present

## 2015-05-12 DIAGNOSIS — N186 End stage renal disease: Secondary | ICD-10-CM | POA: Diagnosis not present

## 2015-05-14 DIAGNOSIS — Z01812 Encounter for preprocedural laboratory examination: Secondary | ICD-10-CM | POA: Diagnosis not present

## 2015-05-14 DIAGNOSIS — I770 Arteriovenous fistula, acquired: Secondary | ICD-10-CM | POA: Diagnosis not present

## 2015-05-14 DIAGNOSIS — Z01818 Encounter for other preprocedural examination: Secondary | ICD-10-CM | POA: Diagnosis not present

## 2015-05-15 DIAGNOSIS — N186 End stage renal disease: Secondary | ICD-10-CM | POA: Diagnosis not present

## 2015-05-15 DIAGNOSIS — N2581 Secondary hyperparathyroidism of renal origin: Secondary | ICD-10-CM | POA: Diagnosis not present

## 2015-05-15 DIAGNOSIS — D509 Iron deficiency anemia, unspecified: Secondary | ICD-10-CM | POA: Diagnosis not present

## 2015-05-15 DIAGNOSIS — D631 Anemia in chronic kidney disease: Secondary | ICD-10-CM | POA: Diagnosis not present

## 2015-05-15 DIAGNOSIS — Z992 Dependence on renal dialysis: Secondary | ICD-10-CM | POA: Diagnosis not present

## 2015-05-17 DIAGNOSIS — N2581 Secondary hyperparathyroidism of renal origin: Secondary | ICD-10-CM | POA: Diagnosis not present

## 2015-05-17 DIAGNOSIS — Z992 Dependence on renal dialysis: Secondary | ICD-10-CM | POA: Diagnosis not present

## 2015-05-17 DIAGNOSIS — D509 Iron deficiency anemia, unspecified: Secondary | ICD-10-CM | POA: Diagnosis not present

## 2015-05-17 DIAGNOSIS — D631 Anemia in chronic kidney disease: Secondary | ICD-10-CM | POA: Diagnosis not present

## 2015-05-17 DIAGNOSIS — N186 End stage renal disease: Secondary | ICD-10-CM | POA: Diagnosis not present

## 2015-05-18 DIAGNOSIS — Z01810 Encounter for preprocedural cardiovascular examination: Secondary | ICD-10-CM | POA: Diagnosis not present

## 2015-05-18 DIAGNOSIS — I4891 Unspecified atrial fibrillation: Secondary | ICD-10-CM | POA: Diagnosis not present

## 2015-05-19 DIAGNOSIS — Z992 Dependence on renal dialysis: Secondary | ICD-10-CM | POA: Diagnosis not present

## 2015-05-19 DIAGNOSIS — D631 Anemia in chronic kidney disease: Secondary | ICD-10-CM | POA: Diagnosis not present

## 2015-05-19 DIAGNOSIS — N186 End stage renal disease: Secondary | ICD-10-CM | POA: Diagnosis not present

## 2015-05-19 DIAGNOSIS — D509 Iron deficiency anemia, unspecified: Secondary | ICD-10-CM | POA: Diagnosis not present

## 2015-05-19 DIAGNOSIS — N2581 Secondary hyperparathyroidism of renal origin: Secondary | ICD-10-CM | POA: Diagnosis not present

## 2015-05-21 DIAGNOSIS — N186 End stage renal disease: Secondary | ICD-10-CM | POA: Diagnosis not present

## 2015-05-21 DIAGNOSIS — D509 Iron deficiency anemia, unspecified: Secondary | ICD-10-CM | POA: Diagnosis not present

## 2015-05-21 DIAGNOSIS — D631 Anemia in chronic kidney disease: Secondary | ICD-10-CM | POA: Diagnosis not present

## 2015-05-21 DIAGNOSIS — N2581 Secondary hyperparathyroidism of renal origin: Secondary | ICD-10-CM | POA: Diagnosis not present

## 2015-05-21 DIAGNOSIS — Z992 Dependence on renal dialysis: Secondary | ICD-10-CM | POA: Diagnosis not present

## 2015-05-22 DIAGNOSIS — T82398A Other mechanical complication of other vascular grafts, initial encounter: Secondary | ICD-10-CM | POA: Diagnosis not present

## 2015-05-22 DIAGNOSIS — T82858A Stenosis of vascular prosthetic devices, implants and grafts, initial encounter: Secondary | ICD-10-CM | POA: Diagnosis not present

## 2015-05-24 DIAGNOSIS — N186 End stage renal disease: Secondary | ICD-10-CM | POA: Diagnosis not present

## 2015-05-24 DIAGNOSIS — D509 Iron deficiency anemia, unspecified: Secondary | ICD-10-CM | POA: Diagnosis not present

## 2015-05-24 DIAGNOSIS — N2581 Secondary hyperparathyroidism of renal origin: Secondary | ICD-10-CM | POA: Diagnosis not present

## 2015-05-24 DIAGNOSIS — Z992 Dependence on renal dialysis: Secondary | ICD-10-CM | POA: Diagnosis not present

## 2015-05-24 DIAGNOSIS — D631 Anemia in chronic kidney disease: Secondary | ICD-10-CM | POA: Diagnosis not present

## 2015-05-26 DIAGNOSIS — D631 Anemia in chronic kidney disease: Secondary | ICD-10-CM | POA: Diagnosis not present

## 2015-05-26 DIAGNOSIS — D509 Iron deficiency anemia, unspecified: Secondary | ICD-10-CM | POA: Diagnosis not present

## 2015-05-26 DIAGNOSIS — N2581 Secondary hyperparathyroidism of renal origin: Secondary | ICD-10-CM | POA: Diagnosis not present

## 2015-05-26 DIAGNOSIS — N186 End stage renal disease: Secondary | ICD-10-CM | POA: Diagnosis not present

## 2015-05-26 DIAGNOSIS — Z992 Dependence on renal dialysis: Secondary | ICD-10-CM | POA: Diagnosis not present

## 2015-05-29 DIAGNOSIS — N2581 Secondary hyperparathyroidism of renal origin: Secondary | ICD-10-CM | POA: Diagnosis not present

## 2015-05-29 DIAGNOSIS — D631 Anemia in chronic kidney disease: Secondary | ICD-10-CM | POA: Diagnosis not present

## 2015-05-29 DIAGNOSIS — Z992 Dependence on renal dialysis: Secondary | ICD-10-CM | POA: Diagnosis not present

## 2015-05-29 DIAGNOSIS — D509 Iron deficiency anemia, unspecified: Secondary | ICD-10-CM | POA: Diagnosis not present

## 2015-05-29 DIAGNOSIS — N186 End stage renal disease: Secondary | ICD-10-CM | POA: Diagnosis not present

## 2015-05-30 DIAGNOSIS — I739 Peripheral vascular disease, unspecified: Secondary | ICD-10-CM | POA: Diagnosis not present

## 2015-05-30 DIAGNOSIS — T82858S Stenosis of vascular prosthetic devices, implants and grafts, sequela: Secondary | ICD-10-CM | POA: Diagnosis not present

## 2015-05-31 DIAGNOSIS — D631 Anemia in chronic kidney disease: Secondary | ICD-10-CM | POA: Diagnosis not present

## 2015-05-31 DIAGNOSIS — N186 End stage renal disease: Secondary | ICD-10-CM | POA: Diagnosis not present

## 2015-05-31 DIAGNOSIS — Z992 Dependence on renal dialysis: Secondary | ICD-10-CM | POA: Diagnosis not present

## 2015-05-31 DIAGNOSIS — D509 Iron deficiency anemia, unspecified: Secondary | ICD-10-CM | POA: Diagnosis not present

## 2015-05-31 DIAGNOSIS — N2581 Secondary hyperparathyroidism of renal origin: Secondary | ICD-10-CM | POA: Diagnosis not present

## 2015-06-02 DIAGNOSIS — D509 Iron deficiency anemia, unspecified: Secondary | ICD-10-CM | POA: Diagnosis not present

## 2015-06-02 DIAGNOSIS — N186 End stage renal disease: Secondary | ICD-10-CM | POA: Diagnosis not present

## 2015-06-02 DIAGNOSIS — N2581 Secondary hyperparathyroidism of renal origin: Secondary | ICD-10-CM | POA: Diagnosis not present

## 2015-06-02 DIAGNOSIS — Z992 Dependence on renal dialysis: Secondary | ICD-10-CM | POA: Diagnosis not present

## 2015-06-02 DIAGNOSIS — D631 Anemia in chronic kidney disease: Secondary | ICD-10-CM | POA: Diagnosis not present

## 2015-06-05 DIAGNOSIS — Z992 Dependence on renal dialysis: Secondary | ICD-10-CM | POA: Diagnosis not present

## 2015-06-05 DIAGNOSIS — N2581 Secondary hyperparathyroidism of renal origin: Secondary | ICD-10-CM | POA: Diagnosis not present

## 2015-06-05 DIAGNOSIS — D631 Anemia in chronic kidney disease: Secondary | ICD-10-CM | POA: Diagnosis not present

## 2015-06-05 DIAGNOSIS — D509 Iron deficiency anemia, unspecified: Secondary | ICD-10-CM | POA: Diagnosis not present

## 2015-06-05 DIAGNOSIS — N186 End stage renal disease: Secondary | ICD-10-CM | POA: Diagnosis not present

## 2015-06-06 DIAGNOSIS — I4891 Unspecified atrial fibrillation: Secondary | ICD-10-CM | POA: Diagnosis not present

## 2015-06-07 DIAGNOSIS — D509 Iron deficiency anemia, unspecified: Secondary | ICD-10-CM | POA: Diagnosis not present

## 2015-06-07 DIAGNOSIS — N2581 Secondary hyperparathyroidism of renal origin: Secondary | ICD-10-CM | POA: Diagnosis not present

## 2015-06-07 DIAGNOSIS — D631 Anemia in chronic kidney disease: Secondary | ICD-10-CM | POA: Diagnosis not present

## 2015-06-07 DIAGNOSIS — N186 End stage renal disease: Secondary | ICD-10-CM | POA: Diagnosis not present

## 2015-06-07 DIAGNOSIS — Z992 Dependence on renal dialysis: Secondary | ICD-10-CM | POA: Diagnosis not present

## 2015-06-08 DIAGNOSIS — N2581 Secondary hyperparathyroidism of renal origin: Secondary | ICD-10-CM | POA: Diagnosis not present

## 2015-06-08 DIAGNOSIS — Z992 Dependence on renal dialysis: Secondary | ICD-10-CM | POA: Diagnosis not present

## 2015-06-08 DIAGNOSIS — D631 Anemia in chronic kidney disease: Secondary | ICD-10-CM | POA: Diagnosis not present

## 2015-06-08 DIAGNOSIS — D509 Iron deficiency anemia, unspecified: Secondary | ICD-10-CM | POA: Diagnosis not present

## 2015-06-08 DIAGNOSIS — N186 End stage renal disease: Secondary | ICD-10-CM | POA: Diagnosis not present

## 2015-06-12 DIAGNOSIS — Z992 Dependence on renal dialysis: Secondary | ICD-10-CM | POA: Diagnosis not present

## 2015-06-12 DIAGNOSIS — N186 End stage renal disease: Secondary | ICD-10-CM | POA: Diagnosis not present

## 2015-06-12 DIAGNOSIS — D509 Iron deficiency anemia, unspecified: Secondary | ICD-10-CM | POA: Diagnosis not present

## 2015-06-12 DIAGNOSIS — D631 Anemia in chronic kidney disease: Secondary | ICD-10-CM | POA: Diagnosis not present

## 2015-06-12 DIAGNOSIS — N2581 Secondary hyperparathyroidism of renal origin: Secondary | ICD-10-CM | POA: Diagnosis not present

## 2015-06-13 DIAGNOSIS — D509 Iron deficiency anemia, unspecified: Secondary | ICD-10-CM | POA: Diagnosis not present

## 2015-06-13 DIAGNOSIS — Z992 Dependence on renal dialysis: Secondary | ICD-10-CM | POA: Diagnosis not present

## 2015-06-13 DIAGNOSIS — D631 Anemia in chronic kidney disease: Secondary | ICD-10-CM | POA: Diagnosis not present

## 2015-06-13 DIAGNOSIS — N2581 Secondary hyperparathyroidism of renal origin: Secondary | ICD-10-CM | POA: Diagnosis not present

## 2015-06-13 DIAGNOSIS — N186 End stage renal disease: Secondary | ICD-10-CM | POA: Diagnosis not present

## 2015-06-16 DIAGNOSIS — N186 End stage renal disease: Secondary | ICD-10-CM | POA: Diagnosis not present

## 2015-06-16 DIAGNOSIS — D509 Iron deficiency anemia, unspecified: Secondary | ICD-10-CM | POA: Diagnosis not present

## 2015-06-16 DIAGNOSIS — N2581 Secondary hyperparathyroidism of renal origin: Secondary | ICD-10-CM | POA: Diagnosis not present

## 2015-06-16 DIAGNOSIS — Z992 Dependence on renal dialysis: Secondary | ICD-10-CM | POA: Diagnosis not present

## 2015-06-16 DIAGNOSIS — D631 Anemia in chronic kidney disease: Secondary | ICD-10-CM | POA: Diagnosis not present

## 2015-06-19 DIAGNOSIS — I158 Other secondary hypertension: Secondary | ICD-10-CM | POA: Diagnosis not present

## 2015-06-19 DIAGNOSIS — Z0000 Encounter for general adult medical examination without abnormal findings: Secondary | ICD-10-CM | POA: Diagnosis not present

## 2015-06-19 DIAGNOSIS — J302 Other seasonal allergic rhinitis: Secondary | ICD-10-CM | POA: Diagnosis not present

## 2015-06-19 DIAGNOSIS — I10 Essential (primary) hypertension: Secondary | ICD-10-CM | POA: Diagnosis not present

## 2015-06-19 DIAGNOSIS — K5900 Constipation, unspecified: Secondary | ICD-10-CM | POA: Diagnosis not present

## 2015-06-19 DIAGNOSIS — R05 Cough: Secondary | ICD-10-CM | POA: Diagnosis not present

## 2015-06-19 DIAGNOSIS — Z79899 Other long term (current) drug therapy: Secondary | ICD-10-CM | POA: Diagnosis not present

## 2015-06-19 DIAGNOSIS — I482 Chronic atrial fibrillation: Secondary | ICD-10-CM | POA: Diagnosis not present

## 2015-06-19 DIAGNOSIS — N186 End stage renal disease: Secondary | ICD-10-CM | POA: Diagnosis not present

## 2015-06-19 DIAGNOSIS — Z8709 Personal history of other diseases of the respiratory system: Secondary | ICD-10-CM | POA: Diagnosis not present

## 2015-06-19 DIAGNOSIS — I4891 Unspecified atrial fibrillation: Secondary | ICD-10-CM | POA: Diagnosis not present

## 2015-06-19 DIAGNOSIS — Z992 Dependence on renal dialysis: Secondary | ICD-10-CM | POA: Diagnosis not present

## 2015-06-19 DIAGNOSIS — G8929 Other chronic pain: Secondary | ICD-10-CM | POA: Diagnosis not present

## 2015-06-19 DIAGNOSIS — Z7901 Long term (current) use of anticoagulants: Secondary | ICD-10-CM | POA: Diagnosis not present

## 2015-06-19 DIAGNOSIS — M549 Dorsalgia, unspecified: Secondary | ICD-10-CM | POA: Diagnosis not present

## 2015-06-19 DIAGNOSIS — J45909 Unspecified asthma, uncomplicated: Secondary | ICD-10-CM | POA: Diagnosis not present

## 2015-06-19 DIAGNOSIS — D631 Anemia in chronic kidney disease: Secondary | ICD-10-CM | POA: Diagnosis not present

## 2015-06-19 DIAGNOSIS — K921 Melena: Secondary | ICD-10-CM | POA: Diagnosis not present

## 2015-06-19 DIAGNOSIS — I120 Hypertensive chronic kidney disease with stage 5 chronic kidney disease or end stage renal disease: Secondary | ICD-10-CM | POA: Diagnosis not present

## 2015-06-19 DIAGNOSIS — K219 Gastro-esophageal reflux disease without esophagitis: Secondary | ICD-10-CM | POA: Diagnosis not present

## 2015-06-20 DIAGNOSIS — J45909 Unspecified asthma, uncomplicated: Secondary | ICD-10-CM | POA: Diagnosis not present

## 2015-06-20 DIAGNOSIS — R05 Cough: Secondary | ICD-10-CM | POA: Diagnosis not present

## 2015-06-20 DIAGNOSIS — I10 Essential (primary) hypertension: Secondary | ICD-10-CM | POA: Diagnosis not present

## 2015-06-20 DIAGNOSIS — I120 Hypertensive chronic kidney disease with stage 5 chronic kidney disease or end stage renal disease: Secondary | ICD-10-CM | POA: Diagnosis not present

## 2015-06-20 DIAGNOSIS — R195 Other fecal abnormalities: Secondary | ICD-10-CM | POA: Diagnosis not present

## 2015-06-20 DIAGNOSIS — Z79899 Other long term (current) drug therapy: Secondary | ICD-10-CM | POA: Diagnosis not present

## 2015-06-20 DIAGNOSIS — M549 Dorsalgia, unspecified: Secondary | ICD-10-CM | POA: Diagnosis not present

## 2015-06-20 DIAGNOSIS — Z0000 Encounter for general adult medical examination without abnormal findings: Secondary | ICD-10-CM | POA: Diagnosis not present

## 2015-06-20 DIAGNOSIS — M545 Low back pain: Secondary | ICD-10-CM | POA: Diagnosis not present

## 2015-06-20 DIAGNOSIS — N186 End stage renal disease: Secondary | ICD-10-CM | POA: Diagnosis not present

## 2015-06-20 DIAGNOSIS — K219 Gastro-esophageal reflux disease without esophagitis: Secondary | ICD-10-CM | POA: Diagnosis not present

## 2015-06-20 DIAGNOSIS — I4891 Unspecified atrial fibrillation: Secondary | ICD-10-CM | POA: Diagnosis not present

## 2015-06-20 DIAGNOSIS — K921 Melena: Secondary | ICD-10-CM | POA: Diagnosis not present

## 2015-06-20 DIAGNOSIS — K5900 Constipation, unspecified: Secondary | ICD-10-CM | POA: Diagnosis not present

## 2015-06-20 DIAGNOSIS — Z992 Dependence on renal dialysis: Secondary | ICD-10-CM | POA: Diagnosis not present

## 2015-06-20 DIAGNOSIS — Z7901 Long term (current) use of anticoagulants: Secondary | ICD-10-CM | POA: Diagnosis not present

## 2015-06-20 DIAGNOSIS — G8929 Other chronic pain: Secondary | ICD-10-CM | POA: Diagnosis not present

## 2015-06-20 DIAGNOSIS — Z1231 Encounter for screening mammogram for malignant neoplasm of breast: Secondary | ICD-10-CM | POA: Diagnosis not present

## 2015-06-20 DIAGNOSIS — I482 Chronic atrial fibrillation: Secondary | ICD-10-CM | POA: Diagnosis not present

## 2015-06-20 DIAGNOSIS — J302 Other seasonal allergic rhinitis: Secondary | ICD-10-CM | POA: Diagnosis not present

## 2015-06-20 DIAGNOSIS — Z8709 Personal history of other diseases of the respiratory system: Secondary | ICD-10-CM | POA: Diagnosis not present

## 2015-06-21 DIAGNOSIS — N186 End stage renal disease: Secondary | ICD-10-CM | POA: Diagnosis not present

## 2015-06-21 DIAGNOSIS — Z23 Encounter for immunization: Secondary | ICD-10-CM | POA: Diagnosis not present

## 2015-06-21 DIAGNOSIS — N2581 Secondary hyperparathyroidism of renal origin: Secondary | ICD-10-CM | POA: Diagnosis not present

## 2015-06-21 DIAGNOSIS — D631 Anemia in chronic kidney disease: Secondary | ICD-10-CM | POA: Diagnosis not present

## 2015-06-21 DIAGNOSIS — D509 Iron deficiency anemia, unspecified: Secondary | ICD-10-CM | POA: Diagnosis not present

## 2015-06-23 DIAGNOSIS — D631 Anemia in chronic kidney disease: Secondary | ICD-10-CM | POA: Diagnosis not present

## 2015-06-23 DIAGNOSIS — D509 Iron deficiency anemia, unspecified: Secondary | ICD-10-CM | POA: Diagnosis not present

## 2015-06-23 DIAGNOSIS — Z23 Encounter for immunization: Secondary | ICD-10-CM | POA: Diagnosis not present

## 2015-06-23 DIAGNOSIS — N186 End stage renal disease: Secondary | ICD-10-CM | POA: Diagnosis not present

## 2015-06-23 DIAGNOSIS — N2581 Secondary hyperparathyroidism of renal origin: Secondary | ICD-10-CM | POA: Diagnosis not present

## 2015-06-25 DIAGNOSIS — N186 End stage renal disease: Secondary | ICD-10-CM | POA: Diagnosis not present

## 2015-06-25 DIAGNOSIS — D509 Iron deficiency anemia, unspecified: Secondary | ICD-10-CM | POA: Diagnosis not present

## 2015-06-25 DIAGNOSIS — D631 Anemia in chronic kidney disease: Secondary | ICD-10-CM | POA: Diagnosis not present

## 2015-06-25 DIAGNOSIS — Z23 Encounter for immunization: Secondary | ICD-10-CM | POA: Diagnosis not present

## 2015-06-25 DIAGNOSIS — N2581 Secondary hyperparathyroidism of renal origin: Secondary | ICD-10-CM | POA: Diagnosis not present

## 2015-06-27 DIAGNOSIS — D689 Coagulation defect, unspecified: Secondary | ICD-10-CM | POA: Diagnosis not present

## 2015-06-27 DIAGNOSIS — D631 Anemia in chronic kidney disease: Secondary | ICD-10-CM | POA: Diagnosis not present

## 2015-06-27 DIAGNOSIS — N186 End stage renal disease: Secondary | ICD-10-CM | POA: Diagnosis not present

## 2015-06-27 DIAGNOSIS — N2581 Secondary hyperparathyroidism of renal origin: Secondary | ICD-10-CM | POA: Diagnosis not present

## 2015-06-27 DIAGNOSIS — Z23 Encounter for immunization: Secondary | ICD-10-CM | POA: Diagnosis not present

## 2015-06-27 DIAGNOSIS — D509 Iron deficiency anemia, unspecified: Secondary | ICD-10-CM | POA: Diagnosis not present

## 2015-06-29 DIAGNOSIS — N186 End stage renal disease: Secondary | ICD-10-CM | POA: Diagnosis not present

## 2015-06-29 DIAGNOSIS — D631 Anemia in chronic kidney disease: Secondary | ICD-10-CM | POA: Diagnosis not present

## 2015-06-29 DIAGNOSIS — N2581 Secondary hyperparathyroidism of renal origin: Secondary | ICD-10-CM | POA: Diagnosis not present

## 2015-06-29 DIAGNOSIS — Z23 Encounter for immunization: Secondary | ICD-10-CM | POA: Diagnosis not present

## 2015-06-29 DIAGNOSIS — D509 Iron deficiency anemia, unspecified: Secondary | ICD-10-CM | POA: Diagnosis not present

## 2015-07-02 DIAGNOSIS — N186 End stage renal disease: Secondary | ICD-10-CM | POA: Diagnosis not present

## 2015-07-02 DIAGNOSIS — D509 Iron deficiency anemia, unspecified: Secondary | ICD-10-CM | POA: Diagnosis not present

## 2015-07-02 DIAGNOSIS — D631 Anemia in chronic kidney disease: Secondary | ICD-10-CM | POA: Diagnosis not present

## 2015-07-02 DIAGNOSIS — Z23 Encounter for immunization: Secondary | ICD-10-CM | POA: Diagnosis not present

## 2015-07-02 DIAGNOSIS — N2581 Secondary hyperparathyroidism of renal origin: Secondary | ICD-10-CM | POA: Diagnosis not present

## 2015-07-04 DIAGNOSIS — Z23 Encounter for immunization: Secondary | ICD-10-CM | POA: Diagnosis not present

## 2015-07-04 DIAGNOSIS — D631 Anemia in chronic kidney disease: Secondary | ICD-10-CM | POA: Diagnosis not present

## 2015-07-04 DIAGNOSIS — D509 Iron deficiency anemia, unspecified: Secondary | ICD-10-CM | POA: Diagnosis not present

## 2015-07-04 DIAGNOSIS — N2581 Secondary hyperparathyroidism of renal origin: Secondary | ICD-10-CM | POA: Diagnosis not present

## 2015-07-04 DIAGNOSIS — N186 End stage renal disease: Secondary | ICD-10-CM | POA: Diagnosis not present

## 2015-07-06 DIAGNOSIS — Z23 Encounter for immunization: Secondary | ICD-10-CM | POA: Diagnosis not present

## 2015-07-06 DIAGNOSIS — N2581 Secondary hyperparathyroidism of renal origin: Secondary | ICD-10-CM | POA: Diagnosis not present

## 2015-07-06 DIAGNOSIS — D509 Iron deficiency anemia, unspecified: Secondary | ICD-10-CM | POA: Diagnosis not present

## 2015-07-06 DIAGNOSIS — D631 Anemia in chronic kidney disease: Secondary | ICD-10-CM | POA: Diagnosis not present

## 2015-07-06 DIAGNOSIS — N186 End stage renal disease: Secondary | ICD-10-CM | POA: Diagnosis not present

## 2015-07-09 DIAGNOSIS — D509 Iron deficiency anemia, unspecified: Secondary | ICD-10-CM | POA: Diagnosis not present

## 2015-07-09 DIAGNOSIS — N2581 Secondary hyperparathyroidism of renal origin: Secondary | ICD-10-CM | POA: Diagnosis not present

## 2015-07-09 DIAGNOSIS — N186 End stage renal disease: Secondary | ICD-10-CM | POA: Diagnosis not present

## 2015-07-09 DIAGNOSIS — D631 Anemia in chronic kidney disease: Secondary | ICD-10-CM | POA: Diagnosis not present

## 2015-07-09 DIAGNOSIS — Z23 Encounter for immunization: Secondary | ICD-10-CM | POA: Diagnosis not present

## 2015-07-11 DIAGNOSIS — Z23 Encounter for immunization: Secondary | ICD-10-CM | POA: Diagnosis not present

## 2015-07-11 DIAGNOSIS — N186 End stage renal disease: Secondary | ICD-10-CM | POA: Diagnosis not present

## 2015-07-11 DIAGNOSIS — N2581 Secondary hyperparathyroidism of renal origin: Secondary | ICD-10-CM | POA: Diagnosis not present

## 2015-07-11 DIAGNOSIS — D631 Anemia in chronic kidney disease: Secondary | ICD-10-CM | POA: Diagnosis not present

## 2015-07-11 DIAGNOSIS — D509 Iron deficiency anemia, unspecified: Secondary | ICD-10-CM | POA: Diagnosis not present

## 2015-07-13 DIAGNOSIS — N186 End stage renal disease: Secondary | ICD-10-CM | POA: Diagnosis not present

## 2015-07-13 DIAGNOSIS — D509 Iron deficiency anemia, unspecified: Secondary | ICD-10-CM | POA: Diagnosis not present

## 2015-07-13 DIAGNOSIS — N2581 Secondary hyperparathyroidism of renal origin: Secondary | ICD-10-CM | POA: Diagnosis not present

## 2015-07-13 DIAGNOSIS — D631 Anemia in chronic kidney disease: Secondary | ICD-10-CM | POA: Diagnosis not present

## 2015-07-13 DIAGNOSIS — Z23 Encounter for immunization: Secondary | ICD-10-CM | POA: Diagnosis not present

## 2015-07-16 DIAGNOSIS — D509 Iron deficiency anemia, unspecified: Secondary | ICD-10-CM | POA: Diagnosis not present

## 2015-07-16 DIAGNOSIS — N2581 Secondary hyperparathyroidism of renal origin: Secondary | ICD-10-CM | POA: Diagnosis not present

## 2015-07-16 DIAGNOSIS — Z23 Encounter for immunization: Secondary | ICD-10-CM | POA: Diagnosis not present

## 2015-07-16 DIAGNOSIS — D631 Anemia in chronic kidney disease: Secondary | ICD-10-CM | POA: Diagnosis not present

## 2015-07-16 DIAGNOSIS — N186 End stage renal disease: Secondary | ICD-10-CM | POA: Diagnosis not present

## 2015-07-18 DIAGNOSIS — D631 Anemia in chronic kidney disease: Secondary | ICD-10-CM | POA: Diagnosis not present

## 2015-07-18 DIAGNOSIS — N2581 Secondary hyperparathyroidism of renal origin: Secondary | ICD-10-CM | POA: Diagnosis not present

## 2015-07-18 DIAGNOSIS — D509 Iron deficiency anemia, unspecified: Secondary | ICD-10-CM | POA: Diagnosis not present

## 2015-07-18 DIAGNOSIS — Z23 Encounter for immunization: Secondary | ICD-10-CM | POA: Diagnosis not present

## 2015-07-18 DIAGNOSIS — N186 End stage renal disease: Secondary | ICD-10-CM | POA: Diagnosis not present

## 2015-07-19 DIAGNOSIS — Z5181 Encounter for therapeutic drug level monitoring: Secondary | ICD-10-CM | POA: Diagnosis not present

## 2015-07-19 DIAGNOSIS — I4891 Unspecified atrial fibrillation: Secondary | ICD-10-CM | POA: Diagnosis not present

## 2015-07-19 DIAGNOSIS — I480 Paroxysmal atrial fibrillation: Secondary | ICD-10-CM | POA: Diagnosis not present

## 2015-07-19 DIAGNOSIS — Z992 Dependence on renal dialysis: Secondary | ICD-10-CM | POA: Diagnosis not present

## 2015-07-19 DIAGNOSIS — Z7901 Long term (current) use of anticoagulants: Secondary | ICD-10-CM | POA: Diagnosis not present

## 2015-07-20 DIAGNOSIS — Z23 Encounter for immunization: Secondary | ICD-10-CM | POA: Diagnosis not present

## 2015-07-20 DIAGNOSIS — D509 Iron deficiency anemia, unspecified: Secondary | ICD-10-CM | POA: Diagnosis not present

## 2015-07-20 DIAGNOSIS — N2581 Secondary hyperparathyroidism of renal origin: Secondary | ICD-10-CM | POA: Diagnosis not present

## 2015-07-20 DIAGNOSIS — N186 End stage renal disease: Secondary | ICD-10-CM | POA: Diagnosis not present

## 2015-07-20 DIAGNOSIS — D631 Anemia in chronic kidney disease: Secondary | ICD-10-CM | POA: Diagnosis not present

## 2015-07-21 DIAGNOSIS — N186 End stage renal disease: Secondary | ICD-10-CM | POA: Diagnosis not present

## 2015-07-21 DIAGNOSIS — Z992 Dependence on renal dialysis: Secondary | ICD-10-CM | POA: Diagnosis not present

## 2015-07-21 DIAGNOSIS — I158 Other secondary hypertension: Secondary | ICD-10-CM | POA: Diagnosis not present

## 2015-07-23 DIAGNOSIS — D509 Iron deficiency anemia, unspecified: Secondary | ICD-10-CM | POA: Diagnosis not present

## 2015-07-23 DIAGNOSIS — D631 Anemia in chronic kidney disease: Secondary | ICD-10-CM | POA: Diagnosis not present

## 2015-07-23 DIAGNOSIS — N186 End stage renal disease: Secondary | ICD-10-CM | POA: Diagnosis not present

## 2015-07-23 DIAGNOSIS — N2581 Secondary hyperparathyroidism of renal origin: Secondary | ICD-10-CM | POA: Diagnosis not present

## 2015-07-25 DIAGNOSIS — N186 End stage renal disease: Secondary | ICD-10-CM | POA: Diagnosis not present

## 2015-07-25 DIAGNOSIS — D509 Iron deficiency anemia, unspecified: Secondary | ICD-10-CM | POA: Diagnosis not present

## 2015-07-25 DIAGNOSIS — D631 Anemia in chronic kidney disease: Secondary | ICD-10-CM | POA: Diagnosis not present

## 2015-07-25 DIAGNOSIS — N2581 Secondary hyperparathyroidism of renal origin: Secondary | ICD-10-CM | POA: Diagnosis not present

## 2015-07-25 DIAGNOSIS — Z7901 Long term (current) use of anticoagulants: Secondary | ICD-10-CM | POA: Diagnosis not present

## 2015-07-26 DIAGNOSIS — N2581 Secondary hyperparathyroidism of renal origin: Secondary | ICD-10-CM | POA: Diagnosis not present

## 2015-07-26 DIAGNOSIS — D631 Anemia in chronic kidney disease: Secondary | ICD-10-CM | POA: Diagnosis not present

## 2015-07-26 DIAGNOSIS — N186 End stage renal disease: Secondary | ICD-10-CM | POA: Diagnosis not present

## 2015-07-26 DIAGNOSIS — D509 Iron deficiency anemia, unspecified: Secondary | ICD-10-CM | POA: Diagnosis not present

## 2015-07-30 DIAGNOSIS — D631 Anemia in chronic kidney disease: Secondary | ICD-10-CM | POA: Diagnosis not present

## 2015-07-30 DIAGNOSIS — D509 Iron deficiency anemia, unspecified: Secondary | ICD-10-CM | POA: Diagnosis not present

## 2015-07-30 DIAGNOSIS — N186 End stage renal disease: Secondary | ICD-10-CM | POA: Diagnosis not present

## 2015-07-30 DIAGNOSIS — N2581 Secondary hyperparathyroidism of renal origin: Secondary | ICD-10-CM | POA: Diagnosis not present

## 2015-08-01 DIAGNOSIS — N186 End stage renal disease: Secondary | ICD-10-CM | POA: Diagnosis not present

## 2015-08-01 DIAGNOSIS — N2581 Secondary hyperparathyroidism of renal origin: Secondary | ICD-10-CM | POA: Diagnosis not present

## 2015-08-01 DIAGNOSIS — D631 Anemia in chronic kidney disease: Secondary | ICD-10-CM | POA: Diagnosis not present

## 2015-08-01 DIAGNOSIS — D509 Iron deficiency anemia, unspecified: Secondary | ICD-10-CM | POA: Diagnosis not present

## 2015-08-01 DIAGNOSIS — I4891 Unspecified atrial fibrillation: Secondary | ICD-10-CM | POA: Diagnosis not present

## 2015-08-03 DIAGNOSIS — N186 End stage renal disease: Secondary | ICD-10-CM | POA: Diagnosis not present

## 2015-08-03 DIAGNOSIS — N2581 Secondary hyperparathyroidism of renal origin: Secondary | ICD-10-CM | POA: Diagnosis not present

## 2015-08-03 DIAGNOSIS — D631 Anemia in chronic kidney disease: Secondary | ICD-10-CM | POA: Diagnosis not present

## 2015-08-03 DIAGNOSIS — D509 Iron deficiency anemia, unspecified: Secondary | ICD-10-CM | POA: Diagnosis not present

## 2015-08-06 DIAGNOSIS — N2581 Secondary hyperparathyroidism of renal origin: Secondary | ICD-10-CM | POA: Diagnosis not present

## 2015-08-06 DIAGNOSIS — N186 End stage renal disease: Secondary | ICD-10-CM | POA: Diagnosis not present

## 2015-08-06 DIAGNOSIS — D631 Anemia in chronic kidney disease: Secondary | ICD-10-CM | POA: Diagnosis not present

## 2015-08-06 DIAGNOSIS — D509 Iron deficiency anemia, unspecified: Secondary | ICD-10-CM | POA: Diagnosis not present

## 2015-08-08 DIAGNOSIS — I4891 Unspecified atrial fibrillation: Secondary | ICD-10-CM | POA: Diagnosis not present

## 2015-08-08 DIAGNOSIS — D631 Anemia in chronic kidney disease: Secondary | ICD-10-CM | POA: Diagnosis not present

## 2015-08-08 DIAGNOSIS — N2581 Secondary hyperparathyroidism of renal origin: Secondary | ICD-10-CM | POA: Diagnosis not present

## 2015-08-08 DIAGNOSIS — N186 End stage renal disease: Secondary | ICD-10-CM | POA: Diagnosis not present

## 2015-08-08 DIAGNOSIS — D509 Iron deficiency anemia, unspecified: Secondary | ICD-10-CM | POA: Diagnosis not present

## 2015-08-10 DIAGNOSIS — D631 Anemia in chronic kidney disease: Secondary | ICD-10-CM | POA: Diagnosis not present

## 2015-08-10 DIAGNOSIS — N2581 Secondary hyperparathyroidism of renal origin: Secondary | ICD-10-CM | POA: Diagnosis not present

## 2015-08-10 DIAGNOSIS — D509 Iron deficiency anemia, unspecified: Secondary | ICD-10-CM | POA: Diagnosis not present

## 2015-08-10 DIAGNOSIS — N186 End stage renal disease: Secondary | ICD-10-CM | POA: Diagnosis not present

## 2015-08-13 DIAGNOSIS — D509 Iron deficiency anemia, unspecified: Secondary | ICD-10-CM | POA: Diagnosis not present

## 2015-08-13 DIAGNOSIS — N186 End stage renal disease: Secondary | ICD-10-CM | POA: Diagnosis not present

## 2015-08-13 DIAGNOSIS — N2581 Secondary hyperparathyroidism of renal origin: Secondary | ICD-10-CM | POA: Diagnosis not present

## 2015-08-13 DIAGNOSIS — D631 Anemia in chronic kidney disease: Secondary | ICD-10-CM | POA: Diagnosis not present

## 2015-08-15 DIAGNOSIS — D509 Iron deficiency anemia, unspecified: Secondary | ICD-10-CM | POA: Diagnosis not present

## 2015-08-15 DIAGNOSIS — I4891 Unspecified atrial fibrillation: Secondary | ICD-10-CM | POA: Diagnosis not present

## 2015-08-15 DIAGNOSIS — D631 Anemia in chronic kidney disease: Secondary | ICD-10-CM | POA: Diagnosis not present

## 2015-08-15 DIAGNOSIS — N2581 Secondary hyperparathyroidism of renal origin: Secondary | ICD-10-CM | POA: Diagnosis not present

## 2015-08-15 DIAGNOSIS — N186 End stage renal disease: Secondary | ICD-10-CM | POA: Diagnosis not present

## 2015-08-15 DIAGNOSIS — E1129 Type 2 diabetes mellitus with other diabetic kidney complication: Secondary | ICD-10-CM | POA: Diagnosis not present

## 2015-08-17 DIAGNOSIS — N2581 Secondary hyperparathyroidism of renal origin: Secondary | ICD-10-CM | POA: Diagnosis not present

## 2015-08-17 DIAGNOSIS — N186 End stage renal disease: Secondary | ICD-10-CM | POA: Diagnosis not present

## 2015-08-17 DIAGNOSIS — D631 Anemia in chronic kidney disease: Secondary | ICD-10-CM | POA: Diagnosis not present

## 2015-08-17 DIAGNOSIS — D509 Iron deficiency anemia, unspecified: Secondary | ICD-10-CM | POA: Diagnosis not present

## 2015-08-20 DIAGNOSIS — N186 End stage renal disease: Secondary | ICD-10-CM | POA: Diagnosis not present

## 2015-08-20 DIAGNOSIS — D631 Anemia in chronic kidney disease: Secondary | ICD-10-CM | POA: Diagnosis not present

## 2015-08-20 DIAGNOSIS — N2581 Secondary hyperparathyroidism of renal origin: Secondary | ICD-10-CM | POA: Diagnosis not present

## 2015-08-20 DIAGNOSIS — D509 Iron deficiency anemia, unspecified: Secondary | ICD-10-CM | POA: Diagnosis not present

## 2015-08-21 DIAGNOSIS — N186 End stage renal disease: Secondary | ICD-10-CM | POA: Diagnosis not present

## 2015-08-21 DIAGNOSIS — Z992 Dependence on renal dialysis: Secondary | ICD-10-CM | POA: Diagnosis not present

## 2015-08-21 DIAGNOSIS — I158 Other secondary hypertension: Secondary | ICD-10-CM | POA: Diagnosis not present

## 2015-08-22 DIAGNOSIS — N186 End stage renal disease: Secondary | ICD-10-CM | POA: Diagnosis not present

## 2015-08-22 DIAGNOSIS — N2581 Secondary hyperparathyroidism of renal origin: Secondary | ICD-10-CM | POA: Diagnosis not present

## 2015-08-22 DIAGNOSIS — I4891 Unspecified atrial fibrillation: Secondary | ICD-10-CM | POA: Diagnosis not present

## 2015-08-22 DIAGNOSIS — D509 Iron deficiency anemia, unspecified: Secondary | ICD-10-CM | POA: Diagnosis not present

## 2015-08-24 DIAGNOSIS — N186 End stage renal disease: Secondary | ICD-10-CM | POA: Diagnosis not present

## 2015-08-24 DIAGNOSIS — N2581 Secondary hyperparathyroidism of renal origin: Secondary | ICD-10-CM | POA: Diagnosis not present

## 2015-08-24 DIAGNOSIS — D509 Iron deficiency anemia, unspecified: Secondary | ICD-10-CM | POA: Diagnosis not present

## 2015-08-27 DIAGNOSIS — D509 Iron deficiency anemia, unspecified: Secondary | ICD-10-CM | POA: Diagnosis not present

## 2015-08-27 DIAGNOSIS — N2581 Secondary hyperparathyroidism of renal origin: Secondary | ICD-10-CM | POA: Diagnosis not present

## 2015-08-27 DIAGNOSIS — N186 End stage renal disease: Secondary | ICD-10-CM | POA: Diagnosis not present

## 2015-08-29 DIAGNOSIS — D509 Iron deficiency anemia, unspecified: Secondary | ICD-10-CM | POA: Diagnosis not present

## 2015-08-29 DIAGNOSIS — N186 End stage renal disease: Secondary | ICD-10-CM | POA: Diagnosis not present

## 2015-08-29 DIAGNOSIS — I4891 Unspecified atrial fibrillation: Secondary | ICD-10-CM | POA: Diagnosis not present

## 2015-08-29 DIAGNOSIS — N2581 Secondary hyperparathyroidism of renal origin: Secondary | ICD-10-CM | POA: Diagnosis not present

## 2015-08-31 DIAGNOSIS — N186 End stage renal disease: Secondary | ICD-10-CM | POA: Diagnosis not present

## 2015-08-31 DIAGNOSIS — D509 Iron deficiency anemia, unspecified: Secondary | ICD-10-CM | POA: Diagnosis not present

## 2015-08-31 DIAGNOSIS — N2581 Secondary hyperparathyroidism of renal origin: Secondary | ICD-10-CM | POA: Diagnosis not present

## 2015-09-04 DIAGNOSIS — N186 End stage renal disease: Secondary | ICD-10-CM | POA: Diagnosis not present

## 2015-09-04 DIAGNOSIS — N2581 Secondary hyperparathyroidism of renal origin: Secondary | ICD-10-CM | POA: Diagnosis not present

## 2015-09-04 DIAGNOSIS — D509 Iron deficiency anemia, unspecified: Secondary | ICD-10-CM | POA: Diagnosis not present

## 2015-09-05 DIAGNOSIS — N2581 Secondary hyperparathyroidism of renal origin: Secondary | ICD-10-CM | POA: Diagnosis not present

## 2015-09-05 DIAGNOSIS — D509 Iron deficiency anemia, unspecified: Secondary | ICD-10-CM | POA: Diagnosis not present

## 2015-09-05 DIAGNOSIS — I4891 Unspecified atrial fibrillation: Secondary | ICD-10-CM | POA: Diagnosis not present

## 2015-09-05 DIAGNOSIS — N186 End stage renal disease: Secondary | ICD-10-CM | POA: Diagnosis not present

## 2015-09-06 DIAGNOSIS — N186 End stage renal disease: Secondary | ICD-10-CM | POA: Diagnosis not present

## 2015-09-06 DIAGNOSIS — T82858A Stenosis of vascular prosthetic devices, implants and grafts, initial encounter: Secondary | ICD-10-CM | POA: Diagnosis not present

## 2015-09-06 DIAGNOSIS — I871 Compression of vein: Secondary | ICD-10-CM | POA: Diagnosis not present

## 2015-09-06 DIAGNOSIS — Z992 Dependence on renal dialysis: Secondary | ICD-10-CM | POA: Diagnosis not present

## 2015-09-07 DIAGNOSIS — N186 End stage renal disease: Secondary | ICD-10-CM | POA: Diagnosis not present

## 2015-09-07 DIAGNOSIS — N2581 Secondary hyperparathyroidism of renal origin: Secondary | ICD-10-CM | POA: Diagnosis not present

## 2015-09-07 DIAGNOSIS — D509 Iron deficiency anemia, unspecified: Secondary | ICD-10-CM | POA: Diagnosis not present

## 2015-09-10 DIAGNOSIS — N2581 Secondary hyperparathyroidism of renal origin: Secondary | ICD-10-CM | POA: Diagnosis not present

## 2015-09-10 DIAGNOSIS — D509 Iron deficiency anemia, unspecified: Secondary | ICD-10-CM | POA: Diagnosis not present

## 2015-09-10 DIAGNOSIS — N186 End stage renal disease: Secondary | ICD-10-CM | POA: Diagnosis not present

## 2015-09-12 DIAGNOSIS — D509 Iron deficiency anemia, unspecified: Secondary | ICD-10-CM | POA: Diagnosis not present

## 2015-09-12 DIAGNOSIS — N2581 Secondary hyperparathyroidism of renal origin: Secondary | ICD-10-CM | POA: Diagnosis not present

## 2015-09-12 DIAGNOSIS — N186 End stage renal disease: Secondary | ICD-10-CM | POA: Diagnosis not present

## 2015-09-12 DIAGNOSIS — I4891 Unspecified atrial fibrillation: Secondary | ICD-10-CM | POA: Diagnosis not present

## 2015-09-14 DIAGNOSIS — N2581 Secondary hyperparathyroidism of renal origin: Secondary | ICD-10-CM | POA: Diagnosis not present

## 2015-09-14 DIAGNOSIS — D509 Iron deficiency anemia, unspecified: Secondary | ICD-10-CM | POA: Diagnosis not present

## 2015-09-14 DIAGNOSIS — N186 End stage renal disease: Secondary | ICD-10-CM | POA: Diagnosis not present

## 2015-09-17 DIAGNOSIS — N2581 Secondary hyperparathyroidism of renal origin: Secondary | ICD-10-CM | POA: Diagnosis not present

## 2015-09-17 DIAGNOSIS — D509 Iron deficiency anemia, unspecified: Secondary | ICD-10-CM | POA: Diagnosis not present

## 2015-09-17 DIAGNOSIS — N186 End stage renal disease: Secondary | ICD-10-CM | POA: Diagnosis not present

## 2015-09-18 DIAGNOSIS — Z992 Dependence on renal dialysis: Secondary | ICD-10-CM | POA: Diagnosis not present

## 2015-09-18 DIAGNOSIS — N186 End stage renal disease: Secondary | ICD-10-CM | POA: Diagnosis not present

## 2015-09-18 DIAGNOSIS — I158 Other secondary hypertension: Secondary | ICD-10-CM | POA: Diagnosis not present

## 2015-09-19 DIAGNOSIS — N186 End stage renal disease: Secondary | ICD-10-CM | POA: Diagnosis not present

## 2015-09-19 DIAGNOSIS — N2581 Secondary hyperparathyroidism of renal origin: Secondary | ICD-10-CM | POA: Diagnosis not present

## 2015-09-19 DIAGNOSIS — D509 Iron deficiency anemia, unspecified: Secondary | ICD-10-CM | POA: Diagnosis not present

## 2015-09-19 DIAGNOSIS — D631 Anemia in chronic kidney disease: Secondary | ICD-10-CM | POA: Diagnosis not present

## 2015-09-21 DIAGNOSIS — I480 Paroxysmal atrial fibrillation: Secondary | ICD-10-CM | POA: Diagnosis not present

## 2015-09-21 DIAGNOSIS — Z992 Dependence on renal dialysis: Secondary | ICD-10-CM | POA: Diagnosis not present

## 2015-09-21 DIAGNOSIS — N2581 Secondary hyperparathyroidism of renal origin: Secondary | ICD-10-CM | POA: Diagnosis not present

## 2015-09-21 DIAGNOSIS — Z79899 Other long term (current) drug therapy: Secondary | ICD-10-CM | POA: Diagnosis not present

## 2015-09-21 DIAGNOSIS — D631 Anemia in chronic kidney disease: Secondary | ICD-10-CM | POA: Diagnosis not present

## 2015-09-21 DIAGNOSIS — N186 End stage renal disease: Secondary | ICD-10-CM | POA: Diagnosis not present

## 2015-09-21 DIAGNOSIS — D509 Iron deficiency anemia, unspecified: Secondary | ICD-10-CM | POA: Diagnosis not present

## 2015-09-21 DIAGNOSIS — K625 Hemorrhage of anus and rectum: Secondary | ICD-10-CM | POA: Diagnosis not present

## 2015-09-21 DIAGNOSIS — Z7901 Long term (current) use of anticoagulants: Secondary | ICD-10-CM | POA: Diagnosis not present

## 2015-09-21 DIAGNOSIS — Z7951 Long term (current) use of inhaled steroids: Secondary | ICD-10-CM | POA: Diagnosis not present

## 2015-09-21 DIAGNOSIS — I4891 Unspecified atrial fibrillation: Secondary | ICD-10-CM | POA: Diagnosis not present

## 2015-09-21 DIAGNOSIS — I120 Hypertensive chronic kidney disease with stage 5 chronic kidney disease or end stage renal disease: Secondary | ICD-10-CM | POA: Diagnosis not present

## 2015-09-24 DIAGNOSIS — D509 Iron deficiency anemia, unspecified: Secondary | ICD-10-CM | POA: Diagnosis not present

## 2015-09-24 DIAGNOSIS — M545 Low back pain: Secondary | ICD-10-CM | POA: Diagnosis not present

## 2015-09-24 DIAGNOSIS — I129 Hypertensive chronic kidney disease with stage 1 through stage 4 chronic kidney disease, or unspecified chronic kidney disease: Secondary | ICD-10-CM | POA: Diagnosis not present

## 2015-09-24 DIAGNOSIS — M4696 Unspecified inflammatory spondylopathy, lumbar region: Secondary | ICD-10-CM | POA: Diagnosis not present

## 2015-09-24 DIAGNOSIS — N186 End stage renal disease: Secondary | ICD-10-CM | POA: Diagnosis not present

## 2015-09-24 DIAGNOSIS — N189 Chronic kidney disease, unspecified: Secondary | ICD-10-CM | POA: Diagnosis not present

## 2015-09-24 DIAGNOSIS — Z981 Arthrodesis status: Secondary | ICD-10-CM | POA: Diagnosis not present

## 2015-09-24 DIAGNOSIS — D631 Anemia in chronic kidney disease: Secondary | ICD-10-CM | POA: Diagnosis not present

## 2015-09-24 DIAGNOSIS — M1288 Other specific arthropathies, not elsewhere classified, other specified site: Secondary | ICD-10-CM | POA: Diagnosis not present

## 2015-09-24 DIAGNOSIS — Z79899 Other long term (current) drug therapy: Secondary | ICD-10-CM | POA: Diagnosis not present

## 2015-09-24 DIAGNOSIS — Z5181 Encounter for therapeutic drug level monitoring: Secondary | ICD-10-CM | POA: Diagnosis not present

## 2015-09-24 DIAGNOSIS — N2581 Secondary hyperparathyroidism of renal origin: Secondary | ICD-10-CM | POA: Diagnosis not present

## 2015-09-24 DIAGNOSIS — G8929 Other chronic pain: Secondary | ICD-10-CM | POA: Diagnosis not present

## 2015-09-26 DIAGNOSIS — I4891 Unspecified atrial fibrillation: Secondary | ICD-10-CM | POA: Diagnosis not present

## 2015-09-26 DIAGNOSIS — N186 End stage renal disease: Secondary | ICD-10-CM | POA: Diagnosis not present

## 2015-09-26 DIAGNOSIS — D509 Iron deficiency anemia, unspecified: Secondary | ICD-10-CM | POA: Diagnosis not present

## 2015-09-26 DIAGNOSIS — N2581 Secondary hyperparathyroidism of renal origin: Secondary | ICD-10-CM | POA: Diagnosis not present

## 2015-09-26 DIAGNOSIS — D631 Anemia in chronic kidney disease: Secondary | ICD-10-CM | POA: Diagnosis not present

## 2015-09-28 DIAGNOSIS — D631 Anemia in chronic kidney disease: Secondary | ICD-10-CM | POA: Diagnosis not present

## 2015-09-28 DIAGNOSIS — N2581 Secondary hyperparathyroidism of renal origin: Secondary | ICD-10-CM | POA: Diagnosis not present

## 2015-09-28 DIAGNOSIS — D509 Iron deficiency anemia, unspecified: Secondary | ICD-10-CM | POA: Diagnosis not present

## 2015-09-28 DIAGNOSIS — N186 End stage renal disease: Secondary | ICD-10-CM | POA: Diagnosis not present

## 2015-10-01 DIAGNOSIS — N186 End stage renal disease: Secondary | ICD-10-CM | POA: Diagnosis not present

## 2015-10-01 DIAGNOSIS — D509 Iron deficiency anemia, unspecified: Secondary | ICD-10-CM | POA: Diagnosis not present

## 2015-10-01 DIAGNOSIS — N2581 Secondary hyperparathyroidism of renal origin: Secondary | ICD-10-CM | POA: Diagnosis not present

## 2015-10-01 DIAGNOSIS — D631 Anemia in chronic kidney disease: Secondary | ICD-10-CM | POA: Diagnosis not present

## 2015-10-03 DIAGNOSIS — N2581 Secondary hyperparathyroidism of renal origin: Secondary | ICD-10-CM | POA: Diagnosis not present

## 2015-10-03 DIAGNOSIS — I4891 Unspecified atrial fibrillation: Secondary | ICD-10-CM | POA: Diagnosis not present

## 2015-10-03 DIAGNOSIS — D509 Iron deficiency anemia, unspecified: Secondary | ICD-10-CM | POA: Diagnosis not present

## 2015-10-03 DIAGNOSIS — N186 End stage renal disease: Secondary | ICD-10-CM | POA: Diagnosis not present

## 2015-10-03 DIAGNOSIS — D631 Anemia in chronic kidney disease: Secondary | ICD-10-CM | POA: Diagnosis not present

## 2015-10-05 DIAGNOSIS — D631 Anemia in chronic kidney disease: Secondary | ICD-10-CM | POA: Diagnosis not present

## 2015-10-05 DIAGNOSIS — D509 Iron deficiency anemia, unspecified: Secondary | ICD-10-CM | POA: Diagnosis not present

## 2015-10-05 DIAGNOSIS — N2581 Secondary hyperparathyroidism of renal origin: Secondary | ICD-10-CM | POA: Diagnosis not present

## 2015-10-05 DIAGNOSIS — N186 End stage renal disease: Secondary | ICD-10-CM | POA: Diagnosis not present

## 2015-10-08 DIAGNOSIS — D509 Iron deficiency anemia, unspecified: Secondary | ICD-10-CM | POA: Diagnosis not present

## 2015-10-08 DIAGNOSIS — N186 End stage renal disease: Secondary | ICD-10-CM | POA: Diagnosis not present

## 2015-10-08 DIAGNOSIS — D631 Anemia in chronic kidney disease: Secondary | ICD-10-CM | POA: Diagnosis not present

## 2015-10-08 DIAGNOSIS — N2581 Secondary hyperparathyroidism of renal origin: Secondary | ICD-10-CM | POA: Diagnosis not present

## 2015-10-08 LAB — HM COLONOSCOPY

## 2015-10-09 DIAGNOSIS — I10 Essential (primary) hypertension: Secondary | ICD-10-CM | POA: Diagnosis not present

## 2015-10-09 DIAGNOSIS — Z7901 Long term (current) use of anticoagulants: Secondary | ICD-10-CM | POA: Diagnosis not present

## 2015-10-09 DIAGNOSIS — R05 Cough: Secondary | ICD-10-CM | POA: Diagnosis not present

## 2015-10-09 DIAGNOSIS — M549 Dorsalgia, unspecified: Secondary | ICD-10-CM | POA: Diagnosis not present

## 2015-10-09 DIAGNOSIS — K625 Hemorrhage of anus and rectum: Secondary | ICD-10-CM | POA: Diagnosis not present

## 2015-10-09 DIAGNOSIS — I482 Chronic atrial fibrillation: Secondary | ICD-10-CM | POA: Diagnosis not present

## 2015-10-09 DIAGNOSIS — G8929 Other chronic pain: Secondary | ICD-10-CM | POA: Diagnosis not present

## 2015-10-09 DIAGNOSIS — Z8709 Personal history of other diseases of the respiratory system: Secondary | ICD-10-CM | POA: Diagnosis not present

## 2015-10-09 DIAGNOSIS — Z09 Encounter for follow-up examination after completed treatment for conditions other than malignant neoplasm: Secondary | ICD-10-CM | POA: Diagnosis not present

## 2015-10-09 DIAGNOSIS — K635 Polyp of colon: Secondary | ICD-10-CM | POA: Diagnosis not present

## 2015-10-09 DIAGNOSIS — N186 End stage renal disease: Secondary | ICD-10-CM | POA: Diagnosis not present

## 2015-10-09 DIAGNOSIS — J45909 Unspecified asthma, uncomplicated: Secondary | ICD-10-CM | POA: Diagnosis not present

## 2015-10-09 DIAGNOSIS — K219 Gastro-esophageal reflux disease without esophagitis: Secondary | ICD-10-CM | POA: Diagnosis not present

## 2015-10-09 DIAGNOSIS — I120 Hypertensive chronic kidney disease with stage 5 chronic kidney disease or end stage renal disease: Secondary | ICD-10-CM | POA: Diagnosis not present

## 2015-10-09 DIAGNOSIS — Z992 Dependence on renal dialysis: Secondary | ICD-10-CM | POA: Diagnosis not present

## 2015-10-10 DIAGNOSIS — D509 Iron deficiency anemia, unspecified: Secondary | ICD-10-CM | POA: Diagnosis not present

## 2015-10-10 DIAGNOSIS — I4891 Unspecified atrial fibrillation: Secondary | ICD-10-CM | POA: Diagnosis not present

## 2015-10-10 DIAGNOSIS — N186 End stage renal disease: Secondary | ICD-10-CM | POA: Diagnosis not present

## 2015-10-10 DIAGNOSIS — D631 Anemia in chronic kidney disease: Secondary | ICD-10-CM | POA: Diagnosis not present

## 2015-10-10 DIAGNOSIS — N2581 Secondary hyperparathyroidism of renal origin: Secondary | ICD-10-CM | POA: Diagnosis not present

## 2015-10-12 DIAGNOSIS — N186 End stage renal disease: Secondary | ICD-10-CM | POA: Diagnosis not present

## 2015-10-12 DIAGNOSIS — D631 Anemia in chronic kidney disease: Secondary | ICD-10-CM | POA: Diagnosis not present

## 2015-10-12 DIAGNOSIS — N2581 Secondary hyperparathyroidism of renal origin: Secondary | ICD-10-CM | POA: Diagnosis not present

## 2015-10-12 DIAGNOSIS — D509 Iron deficiency anemia, unspecified: Secondary | ICD-10-CM | POA: Diagnosis not present

## 2015-10-15 DIAGNOSIS — N2581 Secondary hyperparathyroidism of renal origin: Secondary | ICD-10-CM | POA: Diagnosis not present

## 2015-10-15 DIAGNOSIS — D631 Anemia in chronic kidney disease: Secondary | ICD-10-CM | POA: Diagnosis not present

## 2015-10-15 DIAGNOSIS — N186 End stage renal disease: Secondary | ICD-10-CM | POA: Diagnosis not present

## 2015-10-15 DIAGNOSIS — D509 Iron deficiency anemia, unspecified: Secondary | ICD-10-CM | POA: Diagnosis not present

## 2015-10-17 DIAGNOSIS — I4891 Unspecified atrial fibrillation: Secondary | ICD-10-CM | POA: Diagnosis not present

## 2015-10-17 DIAGNOSIS — D509 Iron deficiency anemia, unspecified: Secondary | ICD-10-CM | POA: Diagnosis not present

## 2015-10-17 DIAGNOSIS — N2581 Secondary hyperparathyroidism of renal origin: Secondary | ICD-10-CM | POA: Diagnosis not present

## 2015-10-17 DIAGNOSIS — D631 Anemia in chronic kidney disease: Secondary | ICD-10-CM | POA: Diagnosis not present

## 2015-10-17 DIAGNOSIS — N186 End stage renal disease: Secondary | ICD-10-CM | POA: Diagnosis not present

## 2015-10-18 DIAGNOSIS — M549 Dorsalgia, unspecified: Secondary | ICD-10-CM | POA: Diagnosis not present

## 2015-10-18 DIAGNOSIS — Z7901 Long term (current) use of anticoagulants: Secondary | ICD-10-CM | POA: Diagnosis not present

## 2015-10-18 DIAGNOSIS — K219 Gastro-esophageal reflux disease without esophagitis: Secondary | ICD-10-CM | POA: Diagnosis not present

## 2015-10-18 DIAGNOSIS — J4520 Mild intermittent asthma, uncomplicated: Secondary | ICD-10-CM | POA: Diagnosis not present

## 2015-10-18 DIAGNOSIS — D122 Benign neoplasm of ascending colon: Secondary | ICD-10-CM | POA: Diagnosis not present

## 2015-10-18 DIAGNOSIS — Z992 Dependence on renal dialysis: Secondary | ICD-10-CM | POA: Diagnosis not present

## 2015-10-18 DIAGNOSIS — D123 Benign neoplasm of transverse colon: Secondary | ICD-10-CM | POA: Diagnosis not present

## 2015-10-18 DIAGNOSIS — I10 Essential (primary) hypertension: Secondary | ICD-10-CM | POA: Diagnosis not present

## 2015-10-18 DIAGNOSIS — G4733 Obstructive sleep apnea (adult) (pediatric): Secondary | ICD-10-CM | POA: Diagnosis not present

## 2015-10-18 DIAGNOSIS — D649 Anemia, unspecified: Secondary | ICD-10-CM | POA: Diagnosis not present

## 2015-10-18 DIAGNOSIS — E860 Dehydration: Secondary | ICD-10-CM | POA: Diagnosis not present

## 2015-10-18 DIAGNOSIS — G8929 Other chronic pain: Secondary | ICD-10-CM | POA: Diagnosis not present

## 2015-10-18 DIAGNOSIS — N186 End stage renal disease: Secondary | ICD-10-CM | POA: Diagnosis not present

## 2015-10-18 DIAGNOSIS — Z8249 Family history of ischemic heart disease and other diseases of the circulatory system: Secondary | ICD-10-CM | POA: Diagnosis not present

## 2015-10-18 DIAGNOSIS — Z86010 Personal history of colonic polyps: Secondary | ICD-10-CM | POA: Diagnosis not present

## 2015-10-18 DIAGNOSIS — I480 Paroxysmal atrial fibrillation: Secondary | ICD-10-CM | POA: Diagnosis not present

## 2015-10-18 DIAGNOSIS — K625 Hemorrhage of anus and rectum: Secondary | ICD-10-CM | POA: Diagnosis not present

## 2015-10-18 DIAGNOSIS — I120 Hypertensive chronic kidney disease with stage 5 chronic kidney disease or end stage renal disease: Secondary | ICD-10-CM | POA: Diagnosis not present

## 2015-10-18 DIAGNOSIS — D120 Benign neoplasm of cecum: Secondary | ICD-10-CM | POA: Diagnosis not present

## 2015-10-19 DIAGNOSIS — D509 Iron deficiency anemia, unspecified: Secondary | ICD-10-CM | POA: Diagnosis not present

## 2015-10-19 DIAGNOSIS — D631 Anemia in chronic kidney disease: Secondary | ICD-10-CM | POA: Diagnosis not present

## 2015-10-19 DIAGNOSIS — N186 End stage renal disease: Secondary | ICD-10-CM | POA: Diagnosis not present

## 2015-10-19 DIAGNOSIS — I158 Other secondary hypertension: Secondary | ICD-10-CM | POA: Diagnosis not present

## 2015-10-19 DIAGNOSIS — Z992 Dependence on renal dialysis: Secondary | ICD-10-CM | POA: Diagnosis not present

## 2015-10-19 DIAGNOSIS — N2581 Secondary hyperparathyroidism of renal origin: Secondary | ICD-10-CM | POA: Diagnosis not present

## 2015-10-22 DIAGNOSIS — H2513 Age-related nuclear cataract, bilateral: Secondary | ICD-10-CM | POA: Diagnosis not present

## 2015-10-22 DIAGNOSIS — H524 Presbyopia: Secondary | ICD-10-CM | POA: Diagnosis not present

## 2015-10-22 DIAGNOSIS — H5501 Congenital nystagmus: Secondary | ICD-10-CM | POA: Diagnosis not present

## 2015-10-22 DIAGNOSIS — H35033 Hypertensive retinopathy, bilateral: Secondary | ICD-10-CM | POA: Diagnosis not present

## 2015-10-22 DIAGNOSIS — H5203 Hypermetropia, bilateral: Secondary | ICD-10-CM | POA: Diagnosis not present

## 2015-10-22 DIAGNOSIS — I10 Essential (primary) hypertension: Secondary | ICD-10-CM | POA: Diagnosis not present

## 2015-10-22 DIAGNOSIS — H52223 Regular astigmatism, bilateral: Secondary | ICD-10-CM | POA: Diagnosis not present

## 2015-10-24 DIAGNOSIS — I4891 Unspecified atrial fibrillation: Secondary | ICD-10-CM | POA: Diagnosis not present

## 2015-10-24 DIAGNOSIS — D509 Iron deficiency anemia, unspecified: Secondary | ICD-10-CM | POA: Diagnosis not present

## 2015-10-24 DIAGNOSIS — D631 Anemia in chronic kidney disease: Secondary | ICD-10-CM | POA: Diagnosis not present

## 2015-10-24 DIAGNOSIS — N186 End stage renal disease: Secondary | ICD-10-CM | POA: Diagnosis not present

## 2015-10-24 DIAGNOSIS — N2581 Secondary hyperparathyroidism of renal origin: Secondary | ICD-10-CM | POA: Diagnosis not present

## 2015-10-26 DIAGNOSIS — D631 Anemia in chronic kidney disease: Secondary | ICD-10-CM | POA: Diagnosis not present

## 2015-10-26 DIAGNOSIS — N186 End stage renal disease: Secondary | ICD-10-CM | POA: Diagnosis not present

## 2015-10-26 DIAGNOSIS — N2581 Secondary hyperparathyroidism of renal origin: Secondary | ICD-10-CM | POA: Diagnosis not present

## 2015-10-26 DIAGNOSIS — D509 Iron deficiency anemia, unspecified: Secondary | ICD-10-CM | POA: Diagnosis not present

## 2015-10-29 DIAGNOSIS — D631 Anemia in chronic kidney disease: Secondary | ICD-10-CM | POA: Diagnosis not present

## 2015-10-29 DIAGNOSIS — N2581 Secondary hyperparathyroidism of renal origin: Secondary | ICD-10-CM | POA: Diagnosis not present

## 2015-10-29 DIAGNOSIS — N186 End stage renal disease: Secondary | ICD-10-CM | POA: Diagnosis not present

## 2015-10-29 DIAGNOSIS — D509 Iron deficiency anemia, unspecified: Secondary | ICD-10-CM | POA: Diagnosis not present

## 2015-10-31 DIAGNOSIS — Z7901 Long term (current) use of anticoagulants: Secondary | ICD-10-CM | POA: Diagnosis not present

## 2015-10-31 DIAGNOSIS — I129 Hypertensive chronic kidney disease with stage 1 through stage 4 chronic kidney disease, or unspecified chronic kidney disease: Secondary | ICD-10-CM | POA: Diagnosis not present

## 2015-10-31 DIAGNOSIS — D631 Anemia in chronic kidney disease: Secondary | ICD-10-CM | POA: Diagnosis not present

## 2015-10-31 DIAGNOSIS — I4891 Unspecified atrial fibrillation: Secondary | ICD-10-CM | POA: Diagnosis not present

## 2015-10-31 DIAGNOSIS — E669 Obesity, unspecified: Secondary | ICD-10-CM | POA: Diagnosis not present

## 2015-10-31 DIAGNOSIS — N189 Chronic kidney disease, unspecified: Secondary | ICD-10-CM | POA: Diagnosis not present

## 2015-10-31 DIAGNOSIS — Z6841 Body Mass Index (BMI) 40.0 and over, adult: Secondary | ICD-10-CM | POA: Diagnosis not present

## 2015-10-31 DIAGNOSIS — Z79899 Other long term (current) drug therapy: Secondary | ICD-10-CM | POA: Diagnosis not present

## 2015-10-31 DIAGNOSIS — M25561 Pain in right knee: Secondary | ICD-10-CM | POA: Diagnosis not present

## 2015-10-31 DIAGNOSIS — D509 Iron deficiency anemia, unspecified: Secondary | ICD-10-CM | POA: Diagnosis not present

## 2015-10-31 DIAGNOSIS — N186 End stage renal disease: Secondary | ICD-10-CM | POA: Diagnosis not present

## 2015-10-31 DIAGNOSIS — N2581 Secondary hyperparathyroidism of renal origin: Secondary | ICD-10-CM | POA: Diagnosis not present

## 2015-11-02 DIAGNOSIS — N2581 Secondary hyperparathyroidism of renal origin: Secondary | ICD-10-CM | POA: Diagnosis not present

## 2015-11-02 DIAGNOSIS — D631 Anemia in chronic kidney disease: Secondary | ICD-10-CM | POA: Diagnosis not present

## 2015-11-02 DIAGNOSIS — D509 Iron deficiency anemia, unspecified: Secondary | ICD-10-CM | POA: Diagnosis not present

## 2015-11-02 DIAGNOSIS — N186 End stage renal disease: Secondary | ICD-10-CM | POA: Diagnosis not present

## 2015-11-05 DIAGNOSIS — D509 Iron deficiency anemia, unspecified: Secondary | ICD-10-CM | POA: Diagnosis not present

## 2015-11-05 DIAGNOSIS — N2581 Secondary hyperparathyroidism of renal origin: Secondary | ICD-10-CM | POA: Diagnosis not present

## 2015-11-05 DIAGNOSIS — N186 End stage renal disease: Secondary | ICD-10-CM | POA: Diagnosis not present

## 2015-11-05 DIAGNOSIS — D631 Anemia in chronic kidney disease: Secondary | ICD-10-CM | POA: Diagnosis not present

## 2015-11-07 DIAGNOSIS — N186 End stage renal disease: Secondary | ICD-10-CM | POA: Diagnosis not present

## 2015-11-07 DIAGNOSIS — I4891 Unspecified atrial fibrillation: Secondary | ICD-10-CM | POA: Diagnosis not present

## 2015-11-07 DIAGNOSIS — D631 Anemia in chronic kidney disease: Secondary | ICD-10-CM | POA: Diagnosis not present

## 2015-11-07 DIAGNOSIS — D509 Iron deficiency anemia, unspecified: Secondary | ICD-10-CM | POA: Diagnosis not present

## 2015-11-07 DIAGNOSIS — N2581 Secondary hyperparathyroidism of renal origin: Secondary | ICD-10-CM | POA: Diagnosis not present

## 2015-11-08 DIAGNOSIS — M47817 Spondylosis without myelopathy or radiculopathy, lumbosacral region: Secondary | ICD-10-CM | POA: Diagnosis not present

## 2015-11-09 DIAGNOSIS — N2581 Secondary hyperparathyroidism of renal origin: Secondary | ICD-10-CM | POA: Diagnosis not present

## 2015-11-09 DIAGNOSIS — D509 Iron deficiency anemia, unspecified: Secondary | ICD-10-CM | POA: Diagnosis not present

## 2015-11-09 DIAGNOSIS — N186 End stage renal disease: Secondary | ICD-10-CM | POA: Diagnosis not present

## 2015-11-09 DIAGNOSIS — D631 Anemia in chronic kidney disease: Secondary | ICD-10-CM | POA: Diagnosis not present

## 2015-11-12 DIAGNOSIS — N186 End stage renal disease: Secondary | ICD-10-CM | POA: Diagnosis not present

## 2015-11-12 DIAGNOSIS — D509 Iron deficiency anemia, unspecified: Secondary | ICD-10-CM | POA: Diagnosis not present

## 2015-11-12 DIAGNOSIS — N2581 Secondary hyperparathyroidism of renal origin: Secondary | ICD-10-CM | POA: Diagnosis not present

## 2015-11-12 DIAGNOSIS — D631 Anemia in chronic kidney disease: Secondary | ICD-10-CM | POA: Diagnosis not present

## 2015-11-14 DIAGNOSIS — D509 Iron deficiency anemia, unspecified: Secondary | ICD-10-CM | POA: Diagnosis not present

## 2015-11-14 DIAGNOSIS — I4891 Unspecified atrial fibrillation: Secondary | ICD-10-CM | POA: Diagnosis not present

## 2015-11-14 DIAGNOSIS — E1129 Type 2 diabetes mellitus with other diabetic kidney complication: Secondary | ICD-10-CM | POA: Diagnosis not present

## 2015-11-14 DIAGNOSIS — D631 Anemia in chronic kidney disease: Secondary | ICD-10-CM | POA: Diagnosis not present

## 2015-11-14 DIAGNOSIS — N2581 Secondary hyperparathyroidism of renal origin: Secondary | ICD-10-CM | POA: Diagnosis not present

## 2015-11-14 DIAGNOSIS — N186 End stage renal disease: Secondary | ICD-10-CM | POA: Diagnosis not present

## 2015-11-16 DIAGNOSIS — D509 Iron deficiency anemia, unspecified: Secondary | ICD-10-CM | POA: Diagnosis not present

## 2015-11-16 DIAGNOSIS — N2581 Secondary hyperparathyroidism of renal origin: Secondary | ICD-10-CM | POA: Diagnosis not present

## 2015-11-16 DIAGNOSIS — N186 End stage renal disease: Secondary | ICD-10-CM | POA: Diagnosis not present

## 2015-11-16 DIAGNOSIS — D631 Anemia in chronic kidney disease: Secondary | ICD-10-CM | POA: Diagnosis not present

## 2015-11-18 DIAGNOSIS — Z992 Dependence on renal dialysis: Secondary | ICD-10-CM | POA: Diagnosis not present

## 2015-11-18 DIAGNOSIS — I158 Other secondary hypertension: Secondary | ICD-10-CM | POA: Diagnosis not present

## 2015-11-18 DIAGNOSIS — N186 End stage renal disease: Secondary | ICD-10-CM | POA: Diagnosis not present

## 2015-11-19 DIAGNOSIS — N2581 Secondary hyperparathyroidism of renal origin: Secondary | ICD-10-CM | POA: Diagnosis not present

## 2015-11-19 DIAGNOSIS — D509 Iron deficiency anemia, unspecified: Secondary | ICD-10-CM | POA: Diagnosis not present

## 2015-11-19 DIAGNOSIS — N186 End stage renal disease: Secondary | ICD-10-CM | POA: Diagnosis not present

## 2015-11-19 DIAGNOSIS — D631 Anemia in chronic kidney disease: Secondary | ICD-10-CM | POA: Diagnosis not present

## 2015-11-21 DIAGNOSIS — I4891 Unspecified atrial fibrillation: Secondary | ICD-10-CM | POA: Diagnosis not present

## 2015-11-21 DIAGNOSIS — N2581 Secondary hyperparathyroidism of renal origin: Secondary | ICD-10-CM | POA: Diagnosis not present

## 2015-11-21 DIAGNOSIS — D509 Iron deficiency anemia, unspecified: Secondary | ICD-10-CM | POA: Diagnosis not present

## 2015-11-21 DIAGNOSIS — D631 Anemia in chronic kidney disease: Secondary | ICD-10-CM | POA: Diagnosis not present

## 2015-11-21 DIAGNOSIS — N186 End stage renal disease: Secondary | ICD-10-CM | POA: Diagnosis not present

## 2015-11-23 DIAGNOSIS — D631 Anemia in chronic kidney disease: Secondary | ICD-10-CM | POA: Diagnosis not present

## 2015-11-23 DIAGNOSIS — D509 Iron deficiency anemia, unspecified: Secondary | ICD-10-CM | POA: Diagnosis not present

## 2015-11-23 DIAGNOSIS — N186 End stage renal disease: Secondary | ICD-10-CM | POA: Diagnosis not present

## 2015-11-23 DIAGNOSIS — N2581 Secondary hyperparathyroidism of renal origin: Secondary | ICD-10-CM | POA: Diagnosis not present

## 2015-11-26 DIAGNOSIS — D631 Anemia in chronic kidney disease: Secondary | ICD-10-CM | POA: Diagnosis not present

## 2015-11-26 DIAGNOSIS — D509 Iron deficiency anemia, unspecified: Secondary | ICD-10-CM | POA: Diagnosis not present

## 2015-11-26 DIAGNOSIS — N2581 Secondary hyperparathyroidism of renal origin: Secondary | ICD-10-CM | POA: Diagnosis not present

## 2015-11-26 DIAGNOSIS — N186 End stage renal disease: Secondary | ICD-10-CM | POA: Diagnosis not present

## 2015-11-28 DIAGNOSIS — I4891 Unspecified atrial fibrillation: Secondary | ICD-10-CM | POA: Diagnosis not present

## 2015-11-28 DIAGNOSIS — N2581 Secondary hyperparathyroidism of renal origin: Secondary | ICD-10-CM | POA: Diagnosis not present

## 2015-11-28 DIAGNOSIS — D509 Iron deficiency anemia, unspecified: Secondary | ICD-10-CM | POA: Diagnosis not present

## 2015-11-28 DIAGNOSIS — N186 End stage renal disease: Secondary | ICD-10-CM | POA: Diagnosis not present

## 2015-11-28 DIAGNOSIS — D631 Anemia in chronic kidney disease: Secondary | ICD-10-CM | POA: Diagnosis not present

## 2015-11-30 DIAGNOSIS — N186 End stage renal disease: Secondary | ICD-10-CM | POA: Diagnosis not present

## 2015-11-30 DIAGNOSIS — D509 Iron deficiency anemia, unspecified: Secondary | ICD-10-CM | POA: Diagnosis not present

## 2015-11-30 DIAGNOSIS — D631 Anemia in chronic kidney disease: Secondary | ICD-10-CM | POA: Diagnosis not present

## 2015-11-30 DIAGNOSIS — N2581 Secondary hyperparathyroidism of renal origin: Secondary | ICD-10-CM | POA: Diagnosis not present

## 2015-12-03 DIAGNOSIS — N186 End stage renal disease: Secondary | ICD-10-CM | POA: Diagnosis not present

## 2015-12-03 DIAGNOSIS — D631 Anemia in chronic kidney disease: Secondary | ICD-10-CM | POA: Diagnosis not present

## 2015-12-03 DIAGNOSIS — D509 Iron deficiency anemia, unspecified: Secondary | ICD-10-CM | POA: Diagnosis not present

## 2015-12-03 DIAGNOSIS — N2581 Secondary hyperparathyroidism of renal origin: Secondary | ICD-10-CM | POA: Diagnosis not present

## 2015-12-05 DIAGNOSIS — D631 Anemia in chronic kidney disease: Secondary | ICD-10-CM | POA: Diagnosis not present

## 2015-12-05 DIAGNOSIS — N186 End stage renal disease: Secondary | ICD-10-CM | POA: Diagnosis not present

## 2015-12-05 DIAGNOSIS — D509 Iron deficiency anemia, unspecified: Secondary | ICD-10-CM | POA: Diagnosis not present

## 2015-12-05 DIAGNOSIS — I4891 Unspecified atrial fibrillation: Secondary | ICD-10-CM | POA: Diagnosis not present

## 2015-12-05 DIAGNOSIS — N2581 Secondary hyperparathyroidism of renal origin: Secondary | ICD-10-CM | POA: Diagnosis not present

## 2015-12-06 DIAGNOSIS — M47816 Spondylosis without myelopathy or radiculopathy, lumbar region: Secondary | ICD-10-CM | POA: Diagnosis not present

## 2015-12-07 DIAGNOSIS — N186 End stage renal disease: Secondary | ICD-10-CM | POA: Diagnosis not present

## 2015-12-07 DIAGNOSIS — D631 Anemia in chronic kidney disease: Secondary | ICD-10-CM | POA: Diagnosis not present

## 2015-12-07 DIAGNOSIS — D509 Iron deficiency anemia, unspecified: Secondary | ICD-10-CM | POA: Diagnosis not present

## 2015-12-07 DIAGNOSIS — N2581 Secondary hyperparathyroidism of renal origin: Secondary | ICD-10-CM | POA: Diagnosis not present

## 2015-12-10 DIAGNOSIS — D631 Anemia in chronic kidney disease: Secondary | ICD-10-CM | POA: Diagnosis not present

## 2015-12-10 DIAGNOSIS — N186 End stage renal disease: Secondary | ICD-10-CM | POA: Diagnosis not present

## 2015-12-10 DIAGNOSIS — N2581 Secondary hyperparathyroidism of renal origin: Secondary | ICD-10-CM | POA: Diagnosis not present

## 2015-12-10 DIAGNOSIS — D509 Iron deficiency anemia, unspecified: Secondary | ICD-10-CM | POA: Diagnosis not present

## 2015-12-12 DIAGNOSIS — N2581 Secondary hyperparathyroidism of renal origin: Secondary | ICD-10-CM | POA: Diagnosis not present

## 2015-12-12 DIAGNOSIS — D631 Anemia in chronic kidney disease: Secondary | ICD-10-CM | POA: Diagnosis not present

## 2015-12-12 DIAGNOSIS — N186 End stage renal disease: Secondary | ICD-10-CM | POA: Diagnosis not present

## 2015-12-12 DIAGNOSIS — I4891 Unspecified atrial fibrillation: Secondary | ICD-10-CM | POA: Diagnosis not present

## 2015-12-12 DIAGNOSIS — D509 Iron deficiency anemia, unspecified: Secondary | ICD-10-CM | POA: Diagnosis not present

## 2015-12-14 DIAGNOSIS — N186 End stage renal disease: Secondary | ICD-10-CM | POA: Diagnosis not present

## 2015-12-14 DIAGNOSIS — D509 Iron deficiency anemia, unspecified: Secondary | ICD-10-CM | POA: Diagnosis not present

## 2015-12-14 DIAGNOSIS — D631 Anemia in chronic kidney disease: Secondary | ICD-10-CM | POA: Diagnosis not present

## 2015-12-14 DIAGNOSIS — N2581 Secondary hyperparathyroidism of renal origin: Secondary | ICD-10-CM | POA: Diagnosis not present

## 2015-12-17 DIAGNOSIS — D509 Iron deficiency anemia, unspecified: Secondary | ICD-10-CM | POA: Diagnosis not present

## 2015-12-17 DIAGNOSIS — D631 Anemia in chronic kidney disease: Secondary | ICD-10-CM | POA: Diagnosis not present

## 2015-12-17 DIAGNOSIS — N2581 Secondary hyperparathyroidism of renal origin: Secondary | ICD-10-CM | POA: Diagnosis not present

## 2015-12-17 DIAGNOSIS — N186 End stage renal disease: Secondary | ICD-10-CM | POA: Diagnosis not present

## 2015-12-19 DIAGNOSIS — I4891 Unspecified atrial fibrillation: Secondary | ICD-10-CM | POA: Diagnosis not present

## 2015-12-19 DIAGNOSIS — I158 Other secondary hypertension: Secondary | ICD-10-CM | POA: Diagnosis not present

## 2015-12-19 DIAGNOSIS — D631 Anemia in chronic kidney disease: Secondary | ICD-10-CM | POA: Diagnosis not present

## 2015-12-19 DIAGNOSIS — Z992 Dependence on renal dialysis: Secondary | ICD-10-CM | POA: Diagnosis not present

## 2015-12-19 DIAGNOSIS — D509 Iron deficiency anemia, unspecified: Secondary | ICD-10-CM | POA: Diagnosis not present

## 2015-12-19 DIAGNOSIS — N2581 Secondary hyperparathyroidism of renal origin: Secondary | ICD-10-CM | POA: Diagnosis not present

## 2015-12-19 DIAGNOSIS — N186 End stage renal disease: Secondary | ICD-10-CM | POA: Diagnosis not present

## 2015-12-21 DIAGNOSIS — D509 Iron deficiency anemia, unspecified: Secondary | ICD-10-CM | POA: Diagnosis not present

## 2015-12-21 DIAGNOSIS — D631 Anemia in chronic kidney disease: Secondary | ICD-10-CM | POA: Diagnosis not present

## 2015-12-21 DIAGNOSIS — E875 Hyperkalemia: Secondary | ICD-10-CM | POA: Diagnosis not present

## 2015-12-21 DIAGNOSIS — N2581 Secondary hyperparathyroidism of renal origin: Secondary | ICD-10-CM | POA: Diagnosis not present

## 2015-12-21 DIAGNOSIS — N186 End stage renal disease: Secondary | ICD-10-CM | POA: Diagnosis not present

## 2015-12-24 DIAGNOSIS — E875 Hyperkalemia: Secondary | ICD-10-CM | POA: Diagnosis not present

## 2015-12-24 DIAGNOSIS — D631 Anemia in chronic kidney disease: Secondary | ICD-10-CM | POA: Diagnosis not present

## 2015-12-24 DIAGNOSIS — N186 End stage renal disease: Secondary | ICD-10-CM | POA: Diagnosis not present

## 2015-12-24 DIAGNOSIS — N2581 Secondary hyperparathyroidism of renal origin: Secondary | ICD-10-CM | POA: Diagnosis not present

## 2015-12-24 DIAGNOSIS — D509 Iron deficiency anemia, unspecified: Secondary | ICD-10-CM | POA: Diagnosis not present

## 2015-12-26 DIAGNOSIS — D509 Iron deficiency anemia, unspecified: Secondary | ICD-10-CM | POA: Diagnosis not present

## 2015-12-26 DIAGNOSIS — E875 Hyperkalemia: Secondary | ICD-10-CM | POA: Diagnosis not present

## 2015-12-26 DIAGNOSIS — D631 Anemia in chronic kidney disease: Secondary | ICD-10-CM | POA: Diagnosis not present

## 2015-12-26 DIAGNOSIS — N186 End stage renal disease: Secondary | ICD-10-CM | POA: Diagnosis not present

## 2015-12-26 DIAGNOSIS — I4891 Unspecified atrial fibrillation: Secondary | ICD-10-CM | POA: Diagnosis not present

## 2015-12-26 DIAGNOSIS — N2581 Secondary hyperparathyroidism of renal origin: Secondary | ICD-10-CM | POA: Diagnosis not present

## 2015-12-27 DIAGNOSIS — M47817 Spondylosis without myelopathy or radiculopathy, lumbosacral region: Secondary | ICD-10-CM | POA: Diagnosis not present

## 2015-12-28 DIAGNOSIS — N186 End stage renal disease: Secondary | ICD-10-CM | POA: Diagnosis not present

## 2015-12-28 DIAGNOSIS — D509 Iron deficiency anemia, unspecified: Secondary | ICD-10-CM | POA: Diagnosis not present

## 2015-12-28 DIAGNOSIS — D631 Anemia in chronic kidney disease: Secondary | ICD-10-CM | POA: Diagnosis not present

## 2015-12-28 DIAGNOSIS — E875 Hyperkalemia: Secondary | ICD-10-CM | POA: Diagnosis not present

## 2015-12-28 DIAGNOSIS — N2581 Secondary hyperparathyroidism of renal origin: Secondary | ICD-10-CM | POA: Diagnosis not present

## 2015-12-31 DIAGNOSIS — D509 Iron deficiency anemia, unspecified: Secondary | ICD-10-CM | POA: Diagnosis not present

## 2015-12-31 DIAGNOSIS — N2581 Secondary hyperparathyroidism of renal origin: Secondary | ICD-10-CM | POA: Diagnosis not present

## 2015-12-31 DIAGNOSIS — E875 Hyperkalemia: Secondary | ICD-10-CM | POA: Diagnosis not present

## 2015-12-31 DIAGNOSIS — D631 Anemia in chronic kidney disease: Secondary | ICD-10-CM | POA: Diagnosis not present

## 2015-12-31 DIAGNOSIS — N186 End stage renal disease: Secondary | ICD-10-CM | POA: Diagnosis not present

## 2016-01-02 DIAGNOSIS — D509 Iron deficiency anemia, unspecified: Secondary | ICD-10-CM | POA: Diagnosis not present

## 2016-01-02 DIAGNOSIS — N186 End stage renal disease: Secondary | ICD-10-CM | POA: Diagnosis not present

## 2016-01-02 DIAGNOSIS — N2581 Secondary hyperparathyroidism of renal origin: Secondary | ICD-10-CM | POA: Diagnosis not present

## 2016-01-02 DIAGNOSIS — E875 Hyperkalemia: Secondary | ICD-10-CM | POA: Diagnosis not present

## 2016-01-02 DIAGNOSIS — D631 Anemia in chronic kidney disease: Secondary | ICD-10-CM | POA: Diagnosis not present

## 2016-01-02 DIAGNOSIS — I4891 Unspecified atrial fibrillation: Secondary | ICD-10-CM | POA: Diagnosis not present

## 2016-01-04 DIAGNOSIS — D631 Anemia in chronic kidney disease: Secondary | ICD-10-CM | POA: Diagnosis not present

## 2016-01-04 DIAGNOSIS — N186 End stage renal disease: Secondary | ICD-10-CM | POA: Diagnosis not present

## 2016-01-04 DIAGNOSIS — E875 Hyperkalemia: Secondary | ICD-10-CM | POA: Diagnosis not present

## 2016-01-04 DIAGNOSIS — D509 Iron deficiency anemia, unspecified: Secondary | ICD-10-CM | POA: Diagnosis not present

## 2016-01-04 DIAGNOSIS — N2581 Secondary hyperparathyroidism of renal origin: Secondary | ICD-10-CM | POA: Diagnosis not present

## 2016-01-07 DIAGNOSIS — D631 Anemia in chronic kidney disease: Secondary | ICD-10-CM | POA: Diagnosis not present

## 2016-01-07 DIAGNOSIS — N2581 Secondary hyperparathyroidism of renal origin: Secondary | ICD-10-CM | POA: Diagnosis not present

## 2016-01-07 DIAGNOSIS — E875 Hyperkalemia: Secondary | ICD-10-CM | POA: Diagnosis not present

## 2016-01-07 DIAGNOSIS — D509 Iron deficiency anemia, unspecified: Secondary | ICD-10-CM | POA: Diagnosis not present

## 2016-01-07 DIAGNOSIS — N186 End stage renal disease: Secondary | ICD-10-CM | POA: Diagnosis not present

## 2016-01-09 DIAGNOSIS — D509 Iron deficiency anemia, unspecified: Secondary | ICD-10-CM | POA: Diagnosis not present

## 2016-01-09 DIAGNOSIS — N2581 Secondary hyperparathyroidism of renal origin: Secondary | ICD-10-CM | POA: Diagnosis not present

## 2016-01-09 DIAGNOSIS — D631 Anemia in chronic kidney disease: Secondary | ICD-10-CM | POA: Diagnosis not present

## 2016-01-09 DIAGNOSIS — E875 Hyperkalemia: Secondary | ICD-10-CM | POA: Diagnosis not present

## 2016-01-09 DIAGNOSIS — I4891 Unspecified atrial fibrillation: Secondary | ICD-10-CM | POA: Diagnosis not present

## 2016-01-09 DIAGNOSIS — N186 End stage renal disease: Secondary | ICD-10-CM | POA: Diagnosis not present

## 2016-01-11 DIAGNOSIS — N2581 Secondary hyperparathyroidism of renal origin: Secondary | ICD-10-CM | POA: Diagnosis not present

## 2016-01-11 DIAGNOSIS — D631 Anemia in chronic kidney disease: Secondary | ICD-10-CM | POA: Diagnosis not present

## 2016-01-11 DIAGNOSIS — N186 End stage renal disease: Secondary | ICD-10-CM | POA: Diagnosis not present

## 2016-01-11 DIAGNOSIS — D509 Iron deficiency anemia, unspecified: Secondary | ICD-10-CM | POA: Diagnosis not present

## 2016-01-11 DIAGNOSIS — E875 Hyperkalemia: Secondary | ICD-10-CM | POA: Diagnosis not present

## 2016-01-14 DIAGNOSIS — D509 Iron deficiency anemia, unspecified: Secondary | ICD-10-CM | POA: Diagnosis not present

## 2016-01-14 DIAGNOSIS — D631 Anemia in chronic kidney disease: Secondary | ICD-10-CM | POA: Diagnosis not present

## 2016-01-14 DIAGNOSIS — N2581 Secondary hyperparathyroidism of renal origin: Secondary | ICD-10-CM | POA: Diagnosis not present

## 2016-01-14 DIAGNOSIS — N186 End stage renal disease: Secondary | ICD-10-CM | POA: Diagnosis not present

## 2016-01-14 DIAGNOSIS — E875 Hyperkalemia: Secondary | ICD-10-CM | POA: Diagnosis not present

## 2016-01-16 DIAGNOSIS — N186 End stage renal disease: Secondary | ICD-10-CM | POA: Diagnosis not present

## 2016-01-16 DIAGNOSIS — D631 Anemia in chronic kidney disease: Secondary | ICD-10-CM | POA: Diagnosis not present

## 2016-01-16 DIAGNOSIS — D509 Iron deficiency anemia, unspecified: Secondary | ICD-10-CM | POA: Diagnosis not present

## 2016-01-16 DIAGNOSIS — E875 Hyperkalemia: Secondary | ICD-10-CM | POA: Diagnosis not present

## 2016-01-16 DIAGNOSIS — I4891 Unspecified atrial fibrillation: Secondary | ICD-10-CM | POA: Diagnosis not present

## 2016-01-16 DIAGNOSIS — N2581 Secondary hyperparathyroidism of renal origin: Secondary | ICD-10-CM | POA: Diagnosis not present

## 2016-01-17 DIAGNOSIS — I4891 Unspecified atrial fibrillation: Secondary | ICD-10-CM | POA: Diagnosis not present

## 2016-01-17 DIAGNOSIS — N186 End stage renal disease: Secondary | ICD-10-CM | POA: Diagnosis not present

## 2016-01-17 DIAGNOSIS — I120 Hypertensive chronic kidney disease with stage 5 chronic kidney disease or end stage renal disease: Secondary | ICD-10-CM | POA: Diagnosis not present

## 2016-01-17 DIAGNOSIS — J4520 Mild intermittent asthma, uncomplicated: Secondary | ICD-10-CM | POA: Diagnosis not present

## 2016-01-17 DIAGNOSIS — Z992 Dependence on renal dialysis: Secondary | ICD-10-CM | POA: Diagnosis not present

## 2016-01-18 DIAGNOSIS — E875 Hyperkalemia: Secondary | ICD-10-CM | POA: Diagnosis not present

## 2016-01-18 DIAGNOSIS — N186 End stage renal disease: Secondary | ICD-10-CM | POA: Diagnosis not present

## 2016-01-18 DIAGNOSIS — Z992 Dependence on renal dialysis: Secondary | ICD-10-CM | POA: Diagnosis not present

## 2016-01-18 DIAGNOSIS — N2581 Secondary hyperparathyroidism of renal origin: Secondary | ICD-10-CM | POA: Diagnosis not present

## 2016-01-18 DIAGNOSIS — I158 Other secondary hypertension: Secondary | ICD-10-CM | POA: Diagnosis not present

## 2016-01-18 DIAGNOSIS — D509 Iron deficiency anemia, unspecified: Secondary | ICD-10-CM | POA: Diagnosis not present

## 2016-01-18 DIAGNOSIS — D631 Anemia in chronic kidney disease: Secondary | ICD-10-CM | POA: Diagnosis not present

## 2016-01-21 DIAGNOSIS — N186 End stage renal disease: Secondary | ICD-10-CM | POA: Diagnosis not present

## 2016-01-21 DIAGNOSIS — D631 Anemia in chronic kidney disease: Secondary | ICD-10-CM | POA: Diagnosis not present

## 2016-01-21 DIAGNOSIS — N2581 Secondary hyperparathyroidism of renal origin: Secondary | ICD-10-CM | POA: Diagnosis not present

## 2016-01-21 DIAGNOSIS — D509 Iron deficiency anemia, unspecified: Secondary | ICD-10-CM | POA: Diagnosis not present

## 2016-01-23 DIAGNOSIS — I4891 Unspecified atrial fibrillation: Secondary | ICD-10-CM | POA: Diagnosis not present

## 2016-01-23 DIAGNOSIS — N186 End stage renal disease: Secondary | ICD-10-CM | POA: Diagnosis not present

## 2016-01-23 DIAGNOSIS — D631 Anemia in chronic kidney disease: Secondary | ICD-10-CM | POA: Diagnosis not present

## 2016-01-23 DIAGNOSIS — D509 Iron deficiency anemia, unspecified: Secondary | ICD-10-CM | POA: Diagnosis not present

## 2016-01-23 DIAGNOSIS — N2581 Secondary hyperparathyroidism of renal origin: Secondary | ICD-10-CM | POA: Diagnosis not present

## 2016-01-25 DIAGNOSIS — D509 Iron deficiency anemia, unspecified: Secondary | ICD-10-CM | POA: Diagnosis not present

## 2016-01-25 DIAGNOSIS — D631 Anemia in chronic kidney disease: Secondary | ICD-10-CM | POA: Diagnosis not present

## 2016-01-25 DIAGNOSIS — N2581 Secondary hyperparathyroidism of renal origin: Secondary | ICD-10-CM | POA: Diagnosis not present

## 2016-01-25 DIAGNOSIS — N186 End stage renal disease: Secondary | ICD-10-CM | POA: Diagnosis not present

## 2016-01-28 DIAGNOSIS — D631 Anemia in chronic kidney disease: Secondary | ICD-10-CM | POA: Diagnosis not present

## 2016-01-28 DIAGNOSIS — N2581 Secondary hyperparathyroidism of renal origin: Secondary | ICD-10-CM | POA: Diagnosis not present

## 2016-01-28 DIAGNOSIS — D509 Iron deficiency anemia, unspecified: Secondary | ICD-10-CM | POA: Diagnosis not present

## 2016-01-28 DIAGNOSIS — N186 End stage renal disease: Secondary | ICD-10-CM | POA: Diagnosis not present

## 2016-01-30 DIAGNOSIS — N2581 Secondary hyperparathyroidism of renal origin: Secondary | ICD-10-CM | POA: Diagnosis not present

## 2016-01-30 DIAGNOSIS — N186 End stage renal disease: Secondary | ICD-10-CM | POA: Diagnosis not present

## 2016-01-30 DIAGNOSIS — I4891 Unspecified atrial fibrillation: Secondary | ICD-10-CM | POA: Diagnosis not present

## 2016-01-30 DIAGNOSIS — D509 Iron deficiency anemia, unspecified: Secondary | ICD-10-CM | POA: Diagnosis not present

## 2016-01-30 DIAGNOSIS — D631 Anemia in chronic kidney disease: Secondary | ICD-10-CM | POA: Diagnosis not present

## 2016-02-01 DIAGNOSIS — D631 Anemia in chronic kidney disease: Secondary | ICD-10-CM | POA: Diagnosis not present

## 2016-02-01 DIAGNOSIS — N2581 Secondary hyperparathyroidism of renal origin: Secondary | ICD-10-CM | POA: Diagnosis not present

## 2016-02-01 DIAGNOSIS — N186 End stage renal disease: Secondary | ICD-10-CM | POA: Diagnosis not present

## 2016-02-01 DIAGNOSIS — D509 Iron deficiency anemia, unspecified: Secondary | ICD-10-CM | POA: Diagnosis not present

## 2016-02-04 DIAGNOSIS — N186 End stage renal disease: Secondary | ICD-10-CM | POA: Diagnosis not present

## 2016-02-04 DIAGNOSIS — D631 Anemia in chronic kidney disease: Secondary | ICD-10-CM | POA: Diagnosis not present

## 2016-02-04 DIAGNOSIS — D509 Iron deficiency anemia, unspecified: Secondary | ICD-10-CM | POA: Diagnosis not present

## 2016-02-04 DIAGNOSIS — N2581 Secondary hyperparathyroidism of renal origin: Secondary | ICD-10-CM | POA: Diagnosis not present

## 2016-02-06 DIAGNOSIS — N2581 Secondary hyperparathyroidism of renal origin: Secondary | ICD-10-CM | POA: Diagnosis not present

## 2016-02-06 DIAGNOSIS — D509 Iron deficiency anemia, unspecified: Secondary | ICD-10-CM | POA: Diagnosis not present

## 2016-02-06 DIAGNOSIS — D631 Anemia in chronic kidney disease: Secondary | ICD-10-CM | POA: Diagnosis not present

## 2016-02-06 DIAGNOSIS — I4891 Unspecified atrial fibrillation: Secondary | ICD-10-CM | POA: Diagnosis not present

## 2016-02-06 DIAGNOSIS — N186 End stage renal disease: Secondary | ICD-10-CM | POA: Diagnosis not present

## 2016-02-08 DIAGNOSIS — N186 End stage renal disease: Secondary | ICD-10-CM | POA: Diagnosis not present

## 2016-02-08 DIAGNOSIS — D631 Anemia in chronic kidney disease: Secondary | ICD-10-CM | POA: Diagnosis not present

## 2016-02-08 DIAGNOSIS — N2581 Secondary hyperparathyroidism of renal origin: Secondary | ICD-10-CM | POA: Diagnosis not present

## 2016-02-08 DIAGNOSIS — D509 Iron deficiency anemia, unspecified: Secondary | ICD-10-CM | POA: Diagnosis not present

## 2016-02-11 DIAGNOSIS — D631 Anemia in chronic kidney disease: Secondary | ICD-10-CM | POA: Diagnosis not present

## 2016-02-11 DIAGNOSIS — N2581 Secondary hyperparathyroidism of renal origin: Secondary | ICD-10-CM | POA: Diagnosis not present

## 2016-02-11 DIAGNOSIS — D509 Iron deficiency anemia, unspecified: Secondary | ICD-10-CM | POA: Diagnosis not present

## 2016-02-11 DIAGNOSIS — N186 End stage renal disease: Secondary | ICD-10-CM | POA: Diagnosis not present

## 2016-02-13 DIAGNOSIS — D631 Anemia in chronic kidney disease: Secondary | ICD-10-CM | POA: Diagnosis not present

## 2016-02-13 DIAGNOSIS — N2581 Secondary hyperparathyroidism of renal origin: Secondary | ICD-10-CM | POA: Diagnosis not present

## 2016-02-13 DIAGNOSIS — E1129 Type 2 diabetes mellitus with other diabetic kidney complication: Secondary | ICD-10-CM | POA: Diagnosis not present

## 2016-02-13 DIAGNOSIS — I4891 Unspecified atrial fibrillation: Secondary | ICD-10-CM | POA: Diagnosis not present

## 2016-02-13 DIAGNOSIS — N186 End stage renal disease: Secondary | ICD-10-CM | POA: Diagnosis not present

## 2016-02-13 DIAGNOSIS — D509 Iron deficiency anemia, unspecified: Secondary | ICD-10-CM | POA: Diagnosis not present

## 2016-02-14 DIAGNOSIS — K635 Polyp of colon: Secondary | ICD-10-CM | POA: Diagnosis not present

## 2016-02-14 DIAGNOSIS — D120 Benign neoplasm of cecum: Secondary | ICD-10-CM | POA: Diagnosis not present

## 2016-02-14 DIAGNOSIS — N186 End stage renal disease: Secondary | ICD-10-CM | POA: Diagnosis not present

## 2016-02-14 DIAGNOSIS — I482 Chronic atrial fibrillation: Secondary | ICD-10-CM | POA: Diagnosis not present

## 2016-02-14 DIAGNOSIS — Z86010 Personal history of colonic polyps: Secondary | ICD-10-CM | POA: Diagnosis not present

## 2016-02-14 DIAGNOSIS — Z992 Dependence on renal dialysis: Secondary | ICD-10-CM | POA: Diagnosis not present

## 2016-02-14 DIAGNOSIS — K921 Melena: Secondary | ICD-10-CM | POA: Diagnosis not present

## 2016-02-14 DIAGNOSIS — G8929 Other chronic pain: Secondary | ICD-10-CM | POA: Diagnosis not present

## 2016-02-14 DIAGNOSIS — Z7901 Long term (current) use of anticoagulants: Secondary | ICD-10-CM | POA: Diagnosis not present

## 2016-02-14 DIAGNOSIS — I120 Hypertensive chronic kidney disease with stage 5 chronic kidney disease or end stage renal disease: Secondary | ICD-10-CM | POA: Diagnosis not present

## 2016-02-14 DIAGNOSIS — I4891 Unspecified atrial fibrillation: Secondary | ICD-10-CM | POA: Diagnosis not present

## 2016-02-14 DIAGNOSIS — M549 Dorsalgia, unspecified: Secondary | ICD-10-CM | POA: Diagnosis not present

## 2016-02-15 DIAGNOSIS — D631 Anemia in chronic kidney disease: Secondary | ICD-10-CM | POA: Diagnosis not present

## 2016-02-15 DIAGNOSIS — N2581 Secondary hyperparathyroidism of renal origin: Secondary | ICD-10-CM | POA: Diagnosis not present

## 2016-02-15 DIAGNOSIS — D509 Iron deficiency anemia, unspecified: Secondary | ICD-10-CM | POA: Diagnosis not present

## 2016-02-15 DIAGNOSIS — N186 End stage renal disease: Secondary | ICD-10-CM | POA: Diagnosis not present

## 2016-02-18 DIAGNOSIS — N186 End stage renal disease: Secondary | ICD-10-CM | POA: Diagnosis not present

## 2016-02-18 DIAGNOSIS — N2581 Secondary hyperparathyroidism of renal origin: Secondary | ICD-10-CM | POA: Diagnosis not present

## 2016-02-18 DIAGNOSIS — Z992 Dependence on renal dialysis: Secondary | ICD-10-CM | POA: Diagnosis not present

## 2016-02-18 DIAGNOSIS — D631 Anemia in chronic kidney disease: Secondary | ICD-10-CM | POA: Diagnosis not present

## 2016-02-18 DIAGNOSIS — I158 Other secondary hypertension: Secondary | ICD-10-CM | POA: Diagnosis not present

## 2016-02-18 DIAGNOSIS — D509 Iron deficiency anemia, unspecified: Secondary | ICD-10-CM | POA: Diagnosis not present

## 2016-02-20 DIAGNOSIS — I4891 Unspecified atrial fibrillation: Secondary | ICD-10-CM | POA: Diagnosis not present

## 2016-02-20 DIAGNOSIS — D631 Anemia in chronic kidney disease: Secondary | ICD-10-CM | POA: Diagnosis not present

## 2016-02-20 DIAGNOSIS — N2581 Secondary hyperparathyroidism of renal origin: Secondary | ICD-10-CM | POA: Diagnosis not present

## 2016-02-20 DIAGNOSIS — N186 End stage renal disease: Secondary | ICD-10-CM | POA: Diagnosis not present

## 2016-02-20 DIAGNOSIS — D509 Iron deficiency anemia, unspecified: Secondary | ICD-10-CM | POA: Diagnosis not present

## 2016-02-22 DIAGNOSIS — N186 End stage renal disease: Secondary | ICD-10-CM | POA: Diagnosis not present

## 2016-02-22 DIAGNOSIS — N2581 Secondary hyperparathyroidism of renal origin: Secondary | ICD-10-CM | POA: Diagnosis not present

## 2016-02-22 DIAGNOSIS — D509 Iron deficiency anemia, unspecified: Secondary | ICD-10-CM | POA: Diagnosis not present

## 2016-02-22 DIAGNOSIS — D631 Anemia in chronic kidney disease: Secondary | ICD-10-CM | POA: Diagnosis not present

## 2016-02-25 DIAGNOSIS — D631 Anemia in chronic kidney disease: Secondary | ICD-10-CM | POA: Diagnosis not present

## 2016-02-25 DIAGNOSIS — D509 Iron deficiency anemia, unspecified: Secondary | ICD-10-CM | POA: Diagnosis not present

## 2016-02-25 DIAGNOSIS — N186 End stage renal disease: Secondary | ICD-10-CM | POA: Diagnosis not present

## 2016-02-25 DIAGNOSIS — N2581 Secondary hyperparathyroidism of renal origin: Secondary | ICD-10-CM | POA: Diagnosis not present

## 2016-02-27 DIAGNOSIS — Z86010 Personal history of colonic polyps: Secondary | ICD-10-CM | POA: Diagnosis not present

## 2016-02-27 DIAGNOSIS — K625 Hemorrhage of anus and rectum: Secondary | ICD-10-CM | POA: Diagnosis not present

## 2016-02-27 DIAGNOSIS — D631 Anemia in chronic kidney disease: Secondary | ICD-10-CM | POA: Diagnosis not present

## 2016-02-27 DIAGNOSIS — D500 Iron deficiency anemia secondary to blood loss (chronic): Secondary | ICD-10-CM | POA: Diagnosis not present

## 2016-02-27 DIAGNOSIS — I4891 Unspecified atrial fibrillation: Secondary | ICD-10-CM | POA: Diagnosis not present

## 2016-02-27 DIAGNOSIS — N186 End stage renal disease: Secondary | ICD-10-CM | POA: Diagnosis not present

## 2016-02-27 DIAGNOSIS — D509 Iron deficiency anemia, unspecified: Secondary | ICD-10-CM | POA: Diagnosis not present

## 2016-02-27 DIAGNOSIS — N2581 Secondary hyperparathyroidism of renal origin: Secondary | ICD-10-CM | POA: Diagnosis not present

## 2016-02-29 DIAGNOSIS — D509 Iron deficiency anemia, unspecified: Secondary | ICD-10-CM | POA: Diagnosis not present

## 2016-02-29 DIAGNOSIS — N186 End stage renal disease: Secondary | ICD-10-CM | POA: Diagnosis not present

## 2016-02-29 DIAGNOSIS — D631 Anemia in chronic kidney disease: Secondary | ICD-10-CM | POA: Diagnosis not present

## 2016-02-29 DIAGNOSIS — N2581 Secondary hyperparathyroidism of renal origin: Secondary | ICD-10-CM | POA: Diagnosis not present

## 2016-03-03 DIAGNOSIS — D631 Anemia in chronic kidney disease: Secondary | ICD-10-CM | POA: Diagnosis not present

## 2016-03-03 DIAGNOSIS — D509 Iron deficiency anemia, unspecified: Secondary | ICD-10-CM | POA: Diagnosis not present

## 2016-03-03 DIAGNOSIS — N2581 Secondary hyperparathyroidism of renal origin: Secondary | ICD-10-CM | POA: Diagnosis not present

## 2016-03-03 DIAGNOSIS — N186 End stage renal disease: Secondary | ICD-10-CM | POA: Diagnosis not present

## 2016-03-05 DIAGNOSIS — D631 Anemia in chronic kidney disease: Secondary | ICD-10-CM | POA: Diagnosis not present

## 2016-03-05 DIAGNOSIS — N186 End stage renal disease: Secondary | ICD-10-CM | POA: Diagnosis not present

## 2016-03-05 DIAGNOSIS — D509 Iron deficiency anemia, unspecified: Secondary | ICD-10-CM | POA: Diagnosis not present

## 2016-03-05 DIAGNOSIS — N2581 Secondary hyperparathyroidism of renal origin: Secondary | ICD-10-CM | POA: Diagnosis not present

## 2016-03-05 DIAGNOSIS — I4891 Unspecified atrial fibrillation: Secondary | ICD-10-CM | POA: Diagnosis not present

## 2016-03-07 DIAGNOSIS — N2581 Secondary hyperparathyroidism of renal origin: Secondary | ICD-10-CM | POA: Diagnosis not present

## 2016-03-07 DIAGNOSIS — D509 Iron deficiency anemia, unspecified: Secondary | ICD-10-CM | POA: Diagnosis not present

## 2016-03-07 DIAGNOSIS — D631 Anemia in chronic kidney disease: Secondary | ICD-10-CM | POA: Diagnosis not present

## 2016-03-07 DIAGNOSIS — N186 End stage renal disease: Secondary | ICD-10-CM | POA: Diagnosis not present

## 2016-03-10 DIAGNOSIS — N186 End stage renal disease: Secondary | ICD-10-CM | POA: Diagnosis not present

## 2016-03-10 DIAGNOSIS — D509 Iron deficiency anemia, unspecified: Secondary | ICD-10-CM | POA: Diagnosis not present

## 2016-03-10 DIAGNOSIS — N2581 Secondary hyperparathyroidism of renal origin: Secondary | ICD-10-CM | POA: Diagnosis not present

## 2016-03-10 DIAGNOSIS — D631 Anemia in chronic kidney disease: Secondary | ICD-10-CM | POA: Diagnosis not present

## 2016-03-12 DIAGNOSIS — D509 Iron deficiency anemia, unspecified: Secondary | ICD-10-CM | POA: Diagnosis not present

## 2016-03-12 DIAGNOSIS — N186 End stage renal disease: Secondary | ICD-10-CM | POA: Diagnosis not present

## 2016-03-12 DIAGNOSIS — D631 Anemia in chronic kidney disease: Secondary | ICD-10-CM | POA: Diagnosis not present

## 2016-03-12 DIAGNOSIS — I4891 Unspecified atrial fibrillation: Secondary | ICD-10-CM | POA: Diagnosis not present

## 2016-03-12 DIAGNOSIS — N2581 Secondary hyperparathyroidism of renal origin: Secondary | ICD-10-CM | POA: Diagnosis not present

## 2016-03-14 DIAGNOSIS — N186 End stage renal disease: Secondary | ICD-10-CM | POA: Diagnosis not present

## 2016-03-14 DIAGNOSIS — N2581 Secondary hyperparathyroidism of renal origin: Secondary | ICD-10-CM | POA: Diagnosis not present

## 2016-03-14 DIAGNOSIS — D509 Iron deficiency anemia, unspecified: Secondary | ICD-10-CM | POA: Diagnosis not present

## 2016-03-14 DIAGNOSIS — D631 Anemia in chronic kidney disease: Secondary | ICD-10-CM | POA: Diagnosis not present

## 2016-03-17 DIAGNOSIS — N186 End stage renal disease: Secondary | ICD-10-CM | POA: Diagnosis not present

## 2016-03-17 DIAGNOSIS — D509 Iron deficiency anemia, unspecified: Secondary | ICD-10-CM | POA: Diagnosis not present

## 2016-03-17 DIAGNOSIS — D631 Anemia in chronic kidney disease: Secondary | ICD-10-CM | POA: Diagnosis not present

## 2016-03-17 DIAGNOSIS — N2581 Secondary hyperparathyroidism of renal origin: Secondary | ICD-10-CM | POA: Diagnosis not present

## 2016-03-19 DIAGNOSIS — I4891 Unspecified atrial fibrillation: Secondary | ICD-10-CM | POA: Diagnosis not present

## 2016-03-19 DIAGNOSIS — D631 Anemia in chronic kidney disease: Secondary | ICD-10-CM | POA: Diagnosis not present

## 2016-03-19 DIAGNOSIS — D509 Iron deficiency anemia, unspecified: Secondary | ICD-10-CM | POA: Diagnosis not present

## 2016-03-19 DIAGNOSIS — N186 End stage renal disease: Secondary | ICD-10-CM | POA: Diagnosis not present

## 2016-03-19 DIAGNOSIS — N2581 Secondary hyperparathyroidism of renal origin: Secondary | ICD-10-CM | POA: Diagnosis not present

## 2016-03-20 DIAGNOSIS — N186 End stage renal disease: Secondary | ICD-10-CM | POA: Diagnosis not present

## 2016-03-20 DIAGNOSIS — I158 Other secondary hypertension: Secondary | ICD-10-CM | POA: Diagnosis not present

## 2016-03-20 DIAGNOSIS — Z992 Dependence on renal dialysis: Secondary | ICD-10-CM | POA: Diagnosis not present

## 2016-03-21 DIAGNOSIS — N2581 Secondary hyperparathyroidism of renal origin: Secondary | ICD-10-CM | POA: Diagnosis not present

## 2016-03-21 DIAGNOSIS — N186 End stage renal disease: Secondary | ICD-10-CM | POA: Diagnosis not present

## 2016-03-21 DIAGNOSIS — D631 Anemia in chronic kidney disease: Secondary | ICD-10-CM | POA: Diagnosis not present

## 2016-03-24 DIAGNOSIS — N2581 Secondary hyperparathyroidism of renal origin: Secondary | ICD-10-CM | POA: Diagnosis not present

## 2016-03-24 DIAGNOSIS — D631 Anemia in chronic kidney disease: Secondary | ICD-10-CM | POA: Diagnosis not present

## 2016-03-24 DIAGNOSIS — N186 End stage renal disease: Secondary | ICD-10-CM | POA: Diagnosis not present

## 2016-03-26 DIAGNOSIS — N2581 Secondary hyperparathyroidism of renal origin: Secondary | ICD-10-CM | POA: Diagnosis not present

## 2016-03-26 DIAGNOSIS — I4891 Unspecified atrial fibrillation: Secondary | ICD-10-CM | POA: Diagnosis not present

## 2016-03-26 DIAGNOSIS — N186 End stage renal disease: Secondary | ICD-10-CM | POA: Diagnosis not present

## 2016-03-26 DIAGNOSIS — D631 Anemia in chronic kidney disease: Secondary | ICD-10-CM | POA: Diagnosis not present

## 2016-03-28 DIAGNOSIS — N2581 Secondary hyperparathyroidism of renal origin: Secondary | ICD-10-CM | POA: Diagnosis not present

## 2016-03-28 DIAGNOSIS — D631 Anemia in chronic kidney disease: Secondary | ICD-10-CM | POA: Diagnosis not present

## 2016-03-28 DIAGNOSIS — N186 End stage renal disease: Secondary | ICD-10-CM | POA: Diagnosis not present

## 2016-03-31 DIAGNOSIS — N2581 Secondary hyperparathyroidism of renal origin: Secondary | ICD-10-CM | POA: Diagnosis not present

## 2016-03-31 DIAGNOSIS — D631 Anemia in chronic kidney disease: Secondary | ICD-10-CM | POA: Diagnosis not present

## 2016-03-31 DIAGNOSIS — N186 End stage renal disease: Secondary | ICD-10-CM | POA: Diagnosis not present

## 2016-04-02 DIAGNOSIS — N186 End stage renal disease: Secondary | ICD-10-CM | POA: Diagnosis not present

## 2016-04-02 DIAGNOSIS — D631 Anemia in chronic kidney disease: Secondary | ICD-10-CM | POA: Diagnosis not present

## 2016-04-02 DIAGNOSIS — I4891 Unspecified atrial fibrillation: Secondary | ICD-10-CM | POA: Diagnosis not present

## 2016-04-02 DIAGNOSIS — N2581 Secondary hyperparathyroidism of renal origin: Secondary | ICD-10-CM | POA: Diagnosis not present

## 2016-04-04 DIAGNOSIS — N186 End stage renal disease: Secondary | ICD-10-CM | POA: Diagnosis not present

## 2016-04-04 DIAGNOSIS — N2581 Secondary hyperparathyroidism of renal origin: Secondary | ICD-10-CM | POA: Diagnosis not present

## 2016-04-04 DIAGNOSIS — D631 Anemia in chronic kidney disease: Secondary | ICD-10-CM | POA: Diagnosis not present

## 2016-04-07 DIAGNOSIS — N186 End stage renal disease: Secondary | ICD-10-CM | POA: Diagnosis not present

## 2016-04-07 DIAGNOSIS — N2581 Secondary hyperparathyroidism of renal origin: Secondary | ICD-10-CM | POA: Diagnosis not present

## 2016-04-07 DIAGNOSIS — D631 Anemia in chronic kidney disease: Secondary | ICD-10-CM | POA: Diagnosis not present

## 2016-04-09 DIAGNOSIS — N2581 Secondary hyperparathyroidism of renal origin: Secondary | ICD-10-CM | POA: Diagnosis not present

## 2016-04-09 DIAGNOSIS — I4891 Unspecified atrial fibrillation: Secondary | ICD-10-CM | POA: Diagnosis not present

## 2016-04-09 DIAGNOSIS — N186 End stage renal disease: Secondary | ICD-10-CM | POA: Diagnosis not present

## 2016-04-09 DIAGNOSIS — D631 Anemia in chronic kidney disease: Secondary | ICD-10-CM | POA: Diagnosis not present

## 2016-04-11 DIAGNOSIS — N2581 Secondary hyperparathyroidism of renal origin: Secondary | ICD-10-CM | POA: Diagnosis not present

## 2016-04-11 DIAGNOSIS — N186 End stage renal disease: Secondary | ICD-10-CM | POA: Diagnosis not present

## 2016-04-11 DIAGNOSIS — D631 Anemia in chronic kidney disease: Secondary | ICD-10-CM | POA: Diagnosis not present

## 2016-04-14 DIAGNOSIS — D631 Anemia in chronic kidney disease: Secondary | ICD-10-CM | POA: Diagnosis not present

## 2016-04-14 DIAGNOSIS — N2581 Secondary hyperparathyroidism of renal origin: Secondary | ICD-10-CM | POA: Diagnosis not present

## 2016-04-14 DIAGNOSIS — N186 End stage renal disease: Secondary | ICD-10-CM | POA: Diagnosis not present

## 2016-04-16 DIAGNOSIS — N2581 Secondary hyperparathyroidism of renal origin: Secondary | ICD-10-CM | POA: Diagnosis not present

## 2016-04-16 DIAGNOSIS — N186 End stage renal disease: Secondary | ICD-10-CM | POA: Diagnosis not present

## 2016-04-16 DIAGNOSIS — D631 Anemia in chronic kidney disease: Secondary | ICD-10-CM | POA: Diagnosis not present

## 2016-04-16 DIAGNOSIS — I4891 Unspecified atrial fibrillation: Secondary | ICD-10-CM | POA: Diagnosis not present

## 2016-04-18 DIAGNOSIS — D631 Anemia in chronic kidney disease: Secondary | ICD-10-CM | POA: Diagnosis not present

## 2016-04-18 DIAGNOSIS — N186 End stage renal disease: Secondary | ICD-10-CM | POA: Diagnosis not present

## 2016-04-18 DIAGNOSIS — N2581 Secondary hyperparathyroidism of renal origin: Secondary | ICD-10-CM | POA: Diagnosis not present

## 2016-04-19 DIAGNOSIS — N186 End stage renal disease: Secondary | ICD-10-CM | POA: Diagnosis not present

## 2016-04-19 DIAGNOSIS — I158 Other secondary hypertension: Secondary | ICD-10-CM | POA: Diagnosis not present

## 2016-04-19 DIAGNOSIS — Z992 Dependence on renal dialysis: Secondary | ICD-10-CM | POA: Diagnosis not present

## 2016-04-21 DIAGNOSIS — Z23 Encounter for immunization: Secondary | ICD-10-CM | POA: Diagnosis not present

## 2016-04-21 DIAGNOSIS — N186 End stage renal disease: Secondary | ICD-10-CM | POA: Diagnosis not present

## 2016-04-21 DIAGNOSIS — N2581 Secondary hyperparathyroidism of renal origin: Secondary | ICD-10-CM | POA: Diagnosis not present

## 2016-04-23 DIAGNOSIS — I4891 Unspecified atrial fibrillation: Secondary | ICD-10-CM | POA: Diagnosis not present

## 2016-04-23 DIAGNOSIS — N186 End stage renal disease: Secondary | ICD-10-CM | POA: Diagnosis not present

## 2016-04-23 DIAGNOSIS — Z23 Encounter for immunization: Secondary | ICD-10-CM | POA: Diagnosis not present

## 2016-04-23 DIAGNOSIS — N2581 Secondary hyperparathyroidism of renal origin: Secondary | ICD-10-CM | POA: Diagnosis not present

## 2016-04-25 DIAGNOSIS — Z23 Encounter for immunization: Secondary | ICD-10-CM | POA: Diagnosis not present

## 2016-04-25 DIAGNOSIS — N2581 Secondary hyperparathyroidism of renal origin: Secondary | ICD-10-CM | POA: Diagnosis not present

## 2016-04-25 DIAGNOSIS — N186 End stage renal disease: Secondary | ICD-10-CM | POA: Diagnosis not present

## 2016-04-28 DIAGNOSIS — Z23 Encounter for immunization: Secondary | ICD-10-CM | POA: Diagnosis not present

## 2016-04-28 DIAGNOSIS — N2581 Secondary hyperparathyroidism of renal origin: Secondary | ICD-10-CM | POA: Diagnosis not present

## 2016-04-28 DIAGNOSIS — N186 End stage renal disease: Secondary | ICD-10-CM | POA: Diagnosis not present

## 2016-04-30 DIAGNOSIS — N186 End stage renal disease: Secondary | ICD-10-CM | POA: Diagnosis not present

## 2016-04-30 DIAGNOSIS — Z23 Encounter for immunization: Secondary | ICD-10-CM | POA: Diagnosis not present

## 2016-04-30 DIAGNOSIS — I4891 Unspecified atrial fibrillation: Secondary | ICD-10-CM | POA: Diagnosis not present

## 2016-04-30 DIAGNOSIS — N2581 Secondary hyperparathyroidism of renal origin: Secondary | ICD-10-CM | POA: Diagnosis not present

## 2016-05-02 DIAGNOSIS — N186 End stage renal disease: Secondary | ICD-10-CM | POA: Diagnosis not present

## 2016-05-02 DIAGNOSIS — N2581 Secondary hyperparathyroidism of renal origin: Secondary | ICD-10-CM | POA: Diagnosis not present

## 2016-05-02 DIAGNOSIS — Z23 Encounter for immunization: Secondary | ICD-10-CM | POA: Diagnosis not present

## 2016-05-05 DIAGNOSIS — N186 End stage renal disease: Secondary | ICD-10-CM | POA: Diagnosis not present

## 2016-05-05 DIAGNOSIS — N2581 Secondary hyperparathyroidism of renal origin: Secondary | ICD-10-CM | POA: Diagnosis not present

## 2016-05-05 DIAGNOSIS — Z23 Encounter for immunization: Secondary | ICD-10-CM | POA: Diagnosis not present

## 2016-05-07 DIAGNOSIS — Z23 Encounter for immunization: Secondary | ICD-10-CM | POA: Diagnosis not present

## 2016-05-07 DIAGNOSIS — I4891 Unspecified atrial fibrillation: Secondary | ICD-10-CM | POA: Diagnosis not present

## 2016-05-07 DIAGNOSIS — N2581 Secondary hyperparathyroidism of renal origin: Secondary | ICD-10-CM | POA: Diagnosis not present

## 2016-05-07 DIAGNOSIS — N186 End stage renal disease: Secondary | ICD-10-CM | POA: Diagnosis not present

## 2016-05-09 DIAGNOSIS — Z23 Encounter for immunization: Secondary | ICD-10-CM | POA: Diagnosis not present

## 2016-05-09 DIAGNOSIS — N2581 Secondary hyperparathyroidism of renal origin: Secondary | ICD-10-CM | POA: Diagnosis not present

## 2016-05-09 DIAGNOSIS — N186 End stage renal disease: Secondary | ICD-10-CM | POA: Diagnosis not present

## 2016-05-12 DIAGNOSIS — N186 End stage renal disease: Secondary | ICD-10-CM | POA: Diagnosis not present

## 2016-05-12 DIAGNOSIS — Z23 Encounter for immunization: Secondary | ICD-10-CM | POA: Diagnosis not present

## 2016-05-12 DIAGNOSIS — N2581 Secondary hyperparathyroidism of renal origin: Secondary | ICD-10-CM | POA: Diagnosis not present

## 2016-05-14 DIAGNOSIS — E1129 Type 2 diabetes mellitus with other diabetic kidney complication: Secondary | ICD-10-CM | POA: Diagnosis not present

## 2016-05-14 DIAGNOSIS — I4891 Unspecified atrial fibrillation: Secondary | ICD-10-CM | POA: Diagnosis not present

## 2016-05-14 DIAGNOSIS — Z23 Encounter for immunization: Secondary | ICD-10-CM | POA: Diagnosis not present

## 2016-05-14 DIAGNOSIS — N2581 Secondary hyperparathyroidism of renal origin: Secondary | ICD-10-CM | POA: Diagnosis not present

## 2016-05-14 DIAGNOSIS — N186 End stage renal disease: Secondary | ICD-10-CM | POA: Diagnosis not present

## 2016-05-16 DIAGNOSIS — N186 End stage renal disease: Secondary | ICD-10-CM | POA: Diagnosis not present

## 2016-05-16 DIAGNOSIS — N2581 Secondary hyperparathyroidism of renal origin: Secondary | ICD-10-CM | POA: Diagnosis not present

## 2016-05-16 DIAGNOSIS — Z23 Encounter for immunization: Secondary | ICD-10-CM | POA: Diagnosis not present

## 2016-05-19 DIAGNOSIS — Z23 Encounter for immunization: Secondary | ICD-10-CM | POA: Diagnosis not present

## 2016-05-19 DIAGNOSIS — N186 End stage renal disease: Secondary | ICD-10-CM | POA: Diagnosis not present

## 2016-05-19 DIAGNOSIS — N2581 Secondary hyperparathyroidism of renal origin: Secondary | ICD-10-CM | POA: Diagnosis not present

## 2016-05-20 DIAGNOSIS — N186 End stage renal disease: Secondary | ICD-10-CM | POA: Diagnosis not present

## 2016-05-20 DIAGNOSIS — Z992 Dependence on renal dialysis: Secondary | ICD-10-CM | POA: Diagnosis not present

## 2016-05-20 DIAGNOSIS — I158 Other secondary hypertension: Secondary | ICD-10-CM | POA: Diagnosis not present

## 2016-05-21 DIAGNOSIS — N2581 Secondary hyperparathyroidism of renal origin: Secondary | ICD-10-CM | POA: Diagnosis not present

## 2016-05-21 DIAGNOSIS — D631 Anemia in chronic kidney disease: Secondary | ICD-10-CM | POA: Diagnosis not present

## 2016-05-21 DIAGNOSIS — N186 End stage renal disease: Secondary | ICD-10-CM | POA: Diagnosis not present

## 2016-05-21 DIAGNOSIS — I4891 Unspecified atrial fibrillation: Secondary | ICD-10-CM | POA: Diagnosis not present

## 2016-05-23 DIAGNOSIS — N186 End stage renal disease: Secondary | ICD-10-CM | POA: Diagnosis not present

## 2016-05-23 DIAGNOSIS — D631 Anemia in chronic kidney disease: Secondary | ICD-10-CM | POA: Diagnosis not present

## 2016-05-23 DIAGNOSIS — N2581 Secondary hyperparathyroidism of renal origin: Secondary | ICD-10-CM | POA: Diagnosis not present

## 2016-05-26 DIAGNOSIS — N2581 Secondary hyperparathyroidism of renal origin: Secondary | ICD-10-CM | POA: Diagnosis not present

## 2016-05-26 DIAGNOSIS — N186 End stage renal disease: Secondary | ICD-10-CM | POA: Diagnosis not present

## 2016-05-26 DIAGNOSIS — D631 Anemia in chronic kidney disease: Secondary | ICD-10-CM | POA: Diagnosis not present

## 2016-05-28 DIAGNOSIS — D631 Anemia in chronic kidney disease: Secondary | ICD-10-CM | POA: Diagnosis not present

## 2016-05-28 DIAGNOSIS — I4891 Unspecified atrial fibrillation: Secondary | ICD-10-CM | POA: Diagnosis not present

## 2016-05-28 DIAGNOSIS — N2581 Secondary hyperparathyroidism of renal origin: Secondary | ICD-10-CM | POA: Diagnosis not present

## 2016-05-28 DIAGNOSIS — N186 End stage renal disease: Secondary | ICD-10-CM | POA: Diagnosis not present

## 2016-05-30 DIAGNOSIS — D631 Anemia in chronic kidney disease: Secondary | ICD-10-CM | POA: Diagnosis not present

## 2016-05-30 DIAGNOSIS — N186 End stage renal disease: Secondary | ICD-10-CM | POA: Diagnosis not present

## 2016-05-30 DIAGNOSIS — N2581 Secondary hyperparathyroidism of renal origin: Secondary | ICD-10-CM | POA: Diagnosis not present

## 2016-06-02 DIAGNOSIS — D631 Anemia in chronic kidney disease: Secondary | ICD-10-CM | POA: Diagnosis not present

## 2016-06-02 DIAGNOSIS — N2581 Secondary hyperparathyroidism of renal origin: Secondary | ICD-10-CM | POA: Diagnosis not present

## 2016-06-02 DIAGNOSIS — N186 End stage renal disease: Secondary | ICD-10-CM | POA: Diagnosis not present

## 2016-06-04 DIAGNOSIS — I4891 Unspecified atrial fibrillation: Secondary | ICD-10-CM | POA: Diagnosis not present

## 2016-06-04 DIAGNOSIS — D631 Anemia in chronic kidney disease: Secondary | ICD-10-CM | POA: Diagnosis not present

## 2016-06-04 DIAGNOSIS — N2581 Secondary hyperparathyroidism of renal origin: Secondary | ICD-10-CM | POA: Diagnosis not present

## 2016-06-04 DIAGNOSIS — N186 End stage renal disease: Secondary | ICD-10-CM | POA: Diagnosis not present

## 2016-06-05 DIAGNOSIS — I4891 Unspecified atrial fibrillation: Secondary | ICD-10-CM | POA: Diagnosis not present

## 2016-06-05 DIAGNOSIS — Z7901 Long term (current) use of anticoagulants: Secondary | ICD-10-CM | POA: Diagnosis not present

## 2016-06-05 DIAGNOSIS — N186 End stage renal disease: Secondary | ICD-10-CM | POA: Diagnosis not present

## 2016-06-05 DIAGNOSIS — Z79899 Other long term (current) drug therapy: Secondary | ICD-10-CM | POA: Diagnosis not present

## 2016-06-05 DIAGNOSIS — G8929 Other chronic pain: Secondary | ICD-10-CM | POA: Diagnosis not present

## 2016-06-05 DIAGNOSIS — J45909 Unspecified asthma, uncomplicated: Secondary | ICD-10-CM | POA: Diagnosis not present

## 2016-06-05 DIAGNOSIS — R197 Diarrhea, unspecified: Secondary | ICD-10-CM | POA: Diagnosis not present

## 2016-06-05 DIAGNOSIS — M549 Dorsalgia, unspecified: Secondary | ICD-10-CM | POA: Diagnosis not present

## 2016-06-05 DIAGNOSIS — K5900 Constipation, unspecified: Secondary | ICD-10-CM | POA: Diagnosis not present

## 2016-06-05 DIAGNOSIS — I120 Hypertensive chronic kidney disease with stage 5 chronic kidney disease or end stage renal disease: Secondary | ICD-10-CM | POA: Diagnosis not present

## 2016-06-05 DIAGNOSIS — Z992 Dependence on renal dialysis: Secondary | ICD-10-CM | POA: Diagnosis not present

## 2016-06-06 DIAGNOSIS — D631 Anemia in chronic kidney disease: Secondary | ICD-10-CM | POA: Diagnosis not present

## 2016-06-06 DIAGNOSIS — N2581 Secondary hyperparathyroidism of renal origin: Secondary | ICD-10-CM | POA: Diagnosis not present

## 2016-06-06 DIAGNOSIS — N186 End stage renal disease: Secondary | ICD-10-CM | POA: Diagnosis not present

## 2016-06-08 DIAGNOSIS — N2581 Secondary hyperparathyroidism of renal origin: Secondary | ICD-10-CM | POA: Diagnosis not present

## 2016-06-08 DIAGNOSIS — D631 Anemia in chronic kidney disease: Secondary | ICD-10-CM | POA: Diagnosis not present

## 2016-06-08 DIAGNOSIS — N186 End stage renal disease: Secondary | ICD-10-CM | POA: Diagnosis not present

## 2016-06-10 DIAGNOSIS — N2581 Secondary hyperparathyroidism of renal origin: Secondary | ICD-10-CM | POA: Diagnosis not present

## 2016-06-10 DIAGNOSIS — D631 Anemia in chronic kidney disease: Secondary | ICD-10-CM | POA: Diagnosis not present

## 2016-06-10 DIAGNOSIS — N186 End stage renal disease: Secondary | ICD-10-CM | POA: Diagnosis not present

## 2016-06-10 DIAGNOSIS — I4891 Unspecified atrial fibrillation: Secondary | ICD-10-CM | POA: Diagnosis not present

## 2016-06-13 DIAGNOSIS — N2581 Secondary hyperparathyroidism of renal origin: Secondary | ICD-10-CM | POA: Diagnosis not present

## 2016-06-13 DIAGNOSIS — D631 Anemia in chronic kidney disease: Secondary | ICD-10-CM | POA: Diagnosis not present

## 2016-06-13 DIAGNOSIS — N186 End stage renal disease: Secondary | ICD-10-CM | POA: Diagnosis not present

## 2016-06-16 DIAGNOSIS — D631 Anemia in chronic kidney disease: Secondary | ICD-10-CM | POA: Diagnosis not present

## 2016-06-16 DIAGNOSIS — N2581 Secondary hyperparathyroidism of renal origin: Secondary | ICD-10-CM | POA: Diagnosis not present

## 2016-06-16 DIAGNOSIS — N186 End stage renal disease: Secondary | ICD-10-CM | POA: Diagnosis not present

## 2016-06-18 DIAGNOSIS — I4891 Unspecified atrial fibrillation: Secondary | ICD-10-CM | POA: Diagnosis not present

## 2016-06-18 DIAGNOSIS — D631 Anemia in chronic kidney disease: Secondary | ICD-10-CM | POA: Diagnosis not present

## 2016-06-18 DIAGNOSIS — N186 End stage renal disease: Secondary | ICD-10-CM | POA: Diagnosis not present

## 2016-06-18 DIAGNOSIS — N2581 Secondary hyperparathyroidism of renal origin: Secondary | ICD-10-CM | POA: Diagnosis not present

## 2016-06-19 DIAGNOSIS — N186 End stage renal disease: Secondary | ICD-10-CM | POA: Diagnosis not present

## 2016-06-19 DIAGNOSIS — Z992 Dependence on renal dialysis: Secondary | ICD-10-CM | POA: Diagnosis not present

## 2016-06-19 DIAGNOSIS — I158 Other secondary hypertension: Secondary | ICD-10-CM | POA: Diagnosis not present

## 2016-06-20 DIAGNOSIS — N2581 Secondary hyperparathyroidism of renal origin: Secondary | ICD-10-CM | POA: Diagnosis not present

## 2016-06-20 DIAGNOSIS — D631 Anemia in chronic kidney disease: Secondary | ICD-10-CM | POA: Diagnosis not present

## 2016-06-20 DIAGNOSIS — N186 End stage renal disease: Secondary | ICD-10-CM | POA: Diagnosis not present

## 2016-06-20 HISTORY — PX: OTHER SURGICAL HISTORY: SHX169

## 2016-06-23 DIAGNOSIS — Z86010 Personal history of colonic polyps: Secondary | ICD-10-CM | POA: Diagnosis not present

## 2016-06-23 DIAGNOSIS — R1032 Left lower quadrant pain: Secondary | ICD-10-CM | POA: Diagnosis not present

## 2016-06-23 DIAGNOSIS — D631 Anemia in chronic kidney disease: Secondary | ICD-10-CM | POA: Diagnosis not present

## 2016-06-23 DIAGNOSIS — N186 End stage renal disease: Secondary | ICD-10-CM | POA: Diagnosis not present

## 2016-06-23 DIAGNOSIS — M549 Dorsalgia, unspecified: Secondary | ICD-10-CM | POA: Diagnosis not present

## 2016-06-23 DIAGNOSIS — Z7901 Long term (current) use of anticoagulants: Secondary | ICD-10-CM | POA: Diagnosis not present

## 2016-06-23 DIAGNOSIS — N2581 Secondary hyperparathyroidism of renal origin: Secondary | ICD-10-CM | POA: Diagnosis not present

## 2016-06-23 DIAGNOSIS — Z79899 Other long term (current) drug therapy: Secondary | ICD-10-CM | POA: Diagnosis not present

## 2016-06-23 DIAGNOSIS — I4891 Unspecified atrial fibrillation: Secondary | ICD-10-CM | POA: Diagnosis not present

## 2016-06-23 DIAGNOSIS — I120 Hypertensive chronic kidney disease with stage 5 chronic kidney disease or end stage renal disease: Secondary | ICD-10-CM | POA: Diagnosis not present

## 2016-06-23 DIAGNOSIS — G8929 Other chronic pain: Secondary | ICD-10-CM | POA: Diagnosis not present

## 2016-06-25 ENCOUNTER — Encounter (HOSPITAL_COMMUNITY): Payer: Self-pay | Admitting: *Deleted

## 2016-06-25 ENCOUNTER — Emergency Department (HOSPITAL_COMMUNITY): Payer: Medicare Other

## 2016-06-25 ENCOUNTER — Inpatient Hospital Stay (HOSPITAL_COMMUNITY)
Admission: EM | Admit: 2016-06-25 | Discharge: 2016-07-18 | DRG: 329 | Disposition: A | Discharge status: Home / Self Care | Payer: Medicare Other | Attending: Internal Medicine | Admitting: Internal Medicine

## 2016-06-25 DIAGNOSIS — I959 Hypotension, unspecified: Secondary | ICD-10-CM | POA: Diagnosis not present

## 2016-06-25 DIAGNOSIS — M419 Scoliosis, unspecified: Secondary | ICD-10-CM | POA: Diagnosis present

## 2016-06-25 DIAGNOSIS — I4891 Unspecified atrial fibrillation: Secondary | ICD-10-CM | POA: Diagnosis not present

## 2016-06-25 DIAGNOSIS — Z981 Arthrodesis status: Secondary | ICD-10-CM

## 2016-06-25 DIAGNOSIS — R110 Nausea: Secondary | ICD-10-CM | POA: Diagnosis present

## 2016-06-25 DIAGNOSIS — I700 Atherosclerosis of aorta: Secondary | ICD-10-CM | POA: Diagnosis present

## 2016-06-25 DIAGNOSIS — E8770 Fluid overload, unspecified: Secondary | ICD-10-CM | POA: Diagnosis not present

## 2016-06-25 DIAGNOSIS — K660 Peritoneal adhesions (postprocedural) (postinfection): Secondary | ICD-10-CM | POA: Diagnosis present

## 2016-06-25 DIAGNOSIS — Y838 Other surgical procedures as the cause of abnormal reaction of the patient, or of later complication, without mention of misadventure at the time of the procedure: Secondary | ICD-10-CM | POA: Diagnosis not present

## 2016-06-25 DIAGNOSIS — D72829 Elevated white blood cell count, unspecified: Secondary | ICD-10-CM | POA: Diagnosis not present

## 2016-06-25 DIAGNOSIS — R0602 Shortness of breath: Secondary | ICD-10-CM | POA: Diagnosis present

## 2016-06-25 DIAGNOSIS — D638 Anemia in other chronic diseases classified elsewhere: Secondary | ICD-10-CM | POA: Diagnosis not present

## 2016-06-25 DIAGNOSIS — N2581 Secondary hyperparathyroidism of renal origin: Secondary | ICD-10-CM | POA: Diagnosis not present

## 2016-06-25 DIAGNOSIS — K5700 Diverticulitis of small intestine with perforation and abscess without bleeding: Secondary | ICD-10-CM

## 2016-06-25 DIAGNOSIS — R0789 Other chest pain: Secondary | ICD-10-CM | POA: Diagnosis present

## 2016-06-25 DIAGNOSIS — K5721 Diverticulitis of large intestine with perforation and abscess with bleeding: Principal | ICD-10-CM | POA: Diagnosis present

## 2016-06-25 DIAGNOSIS — I480 Paroxysmal atrial fibrillation: Secondary | ICD-10-CM | POA: Diagnosis present

## 2016-06-25 DIAGNOSIS — K5720 Diverticulitis of large intestine with perforation and abscess without bleeding: Secondary | ICD-10-CM | POA: Diagnosis not present

## 2016-06-25 DIAGNOSIS — I10 Essential (primary) hypertension: Secondary | ICD-10-CM

## 2016-06-25 DIAGNOSIS — Z96652 Presence of left artificial knee joint: Secondary | ICD-10-CM | POA: Diagnosis present

## 2016-06-25 DIAGNOSIS — M1990 Unspecified osteoarthritis, unspecified site: Secondary | ICD-10-CM | POA: Diagnosis present

## 2016-06-25 DIAGNOSIS — R634 Abnormal weight loss: Secondary | ICD-10-CM | POA: Diagnosis present

## 2016-06-25 DIAGNOSIS — K5792 Diverticulitis of intestine, part unspecified, without perforation or abscess without bleeding: Secondary | ICD-10-CM

## 2016-06-25 DIAGNOSIS — N390 Urinary tract infection, site not specified: Secondary | ICD-10-CM | POA: Diagnosis not present

## 2016-06-25 DIAGNOSIS — R079 Chest pain, unspecified: Secondary | ICD-10-CM | POA: Diagnosis present

## 2016-06-25 DIAGNOSIS — R112 Nausea with vomiting, unspecified: Secondary | ICD-10-CM

## 2016-06-25 DIAGNOSIS — Y92239 Unspecified place in hospital as the place of occurrence of the external cause: Secondary | ICD-10-CM | POA: Diagnosis not present

## 2016-06-25 DIAGNOSIS — Z9889 Other specified postprocedural states: Secondary | ICD-10-CM

## 2016-06-25 DIAGNOSIS — Z7901 Long term (current) use of anticoagulants: Secondary | ICD-10-CM

## 2016-06-25 DIAGNOSIS — I120 Hypertensive chronic kidney disease with stage 5 chronic kidney disease or end stage renal disease: Secondary | ICD-10-CM | POA: Diagnosis not present

## 2016-06-25 DIAGNOSIS — Z79899 Other long term (current) drug therapy: Secondary | ICD-10-CM

## 2016-06-25 DIAGNOSIS — N186 End stage renal disease: Secondary | ICD-10-CM | POA: Diagnosis not present

## 2016-06-25 DIAGNOSIS — R1032 Left lower quadrant pain: Secondary | ICD-10-CM | POA: Diagnosis not present

## 2016-06-25 DIAGNOSIS — Z86010 Personal history of colonic polyps: Secondary | ICD-10-CM

## 2016-06-25 DIAGNOSIS — K760 Fatty (change of) liver, not elsewhere classified: Secondary | ICD-10-CM | POA: Diagnosis present

## 2016-06-25 DIAGNOSIS — E871 Hypo-osmolality and hyponatremia: Secondary | ICD-10-CM | POA: Diagnosis not present

## 2016-06-25 DIAGNOSIS — M898X9 Other specified disorders of bone, unspecified site: Secondary | ICD-10-CM | POA: Diagnosis present

## 2016-06-25 DIAGNOSIS — G4730 Sleep apnea, unspecified: Secondary | ICD-10-CM | POA: Diagnosis present

## 2016-06-25 DIAGNOSIS — Z8249 Family history of ischemic heart disease and other diseases of the circulatory system: Secondary | ICD-10-CM

## 2016-06-25 DIAGNOSIS — G8929 Other chronic pain: Secondary | ICD-10-CM

## 2016-06-25 DIAGNOSIS — D631 Anemia in chronic kidney disease: Secondary | ICD-10-CM | POA: Diagnosis present

## 2016-06-25 DIAGNOSIS — K922 Gastrointestinal hemorrhage, unspecified: Secondary | ICD-10-CM | POA: Diagnosis present

## 2016-06-25 DIAGNOSIS — K219 Gastro-esophageal reflux disease without esophagitis: Secondary | ICD-10-CM | POA: Diagnosis present

## 2016-06-25 DIAGNOSIS — Z91048 Other nonmedicinal substance allergy status: Secondary | ICD-10-CM

## 2016-06-25 DIAGNOSIS — I129 Hypertensive chronic kidney disease with stage 1 through stage 4 chronic kidney disease, or unspecified chronic kidney disease: Secondary | ICD-10-CM | POA: Diagnosis present

## 2016-06-25 DIAGNOSIS — K913 Postprocedural intestinal obstruction, unspecified as to partial versus complete: Secondary | ICD-10-CM | POA: Diagnosis not present

## 2016-06-25 DIAGNOSIS — Z6838 Body mass index (BMI) 38.0-38.9, adult: Secondary | ICD-10-CM

## 2016-06-25 DIAGNOSIS — Z992 Dependence on renal dialysis: Secondary | ICD-10-CM

## 2016-06-25 DIAGNOSIS — K5730 Diverticulosis of large intestine without perforation or abscess without bleeding: Secondary | ICD-10-CM | POA: Diagnosis not present

## 2016-06-25 DIAGNOSIS — N736 Female pelvic peritoneal adhesions (postinfective): Secondary | ICD-10-CM | POA: Diagnosis present

## 2016-06-25 DIAGNOSIS — M5136 Other intervertebral disc degeneration, lumbar region: Secondary | ICD-10-CM | POA: Diagnosis present

## 2016-06-25 DIAGNOSIS — Z96659 Presence of unspecified artificial knee joint: Secondary | ICD-10-CM | POA: Diagnosis present

## 2016-06-25 DIAGNOSIS — E669 Obesity, unspecified: Secondary | ICD-10-CM | POA: Diagnosis present

## 2016-06-25 DIAGNOSIS — E611 Iron deficiency: Secondary | ICD-10-CM | POA: Diagnosis not present

## 2016-06-25 HISTORY — DX: Essential (primary) hypertension: I10

## 2016-06-25 HISTORY — DX: Sleep apnea, unspecified: G47.30

## 2016-06-25 HISTORY — DX: Gastro-esophageal reflux disease without esophagitis: K21.9

## 2016-06-25 LAB — COMPREHENSIVE METABOLIC PANEL
ALBUMIN: 3.6 g/dL (ref 3.5–5.0)
ALT: 15 U/L (ref 14–54)
AST: 18 U/L (ref 15–41)
Alkaline Phosphatase: 113 U/L (ref 38–126)
Anion gap: 14 (ref 5–15)
BILIRUBIN TOTAL: 0.6 mg/dL (ref 0.3–1.2)
BUN: 17 mg/dL (ref 6–20)
CHLORIDE: 95 mmol/L — AB (ref 101–111)
CO2: 30 mmol/L (ref 22–32)
Calcium: 8.9 mg/dL (ref 8.9–10.3)
Creatinine, Ser: 3.48 mg/dL — ABNORMAL HIGH (ref 0.44–1.00)
GFR calc Af Amer: 14 mL/min — ABNORMAL LOW (ref >60)
GFR calc non Af Amer: 12 mL/min — ABNORMAL LOW (ref >60)
GLUCOSE: 93 mg/dL (ref 65–99)
POTASSIUM: 3.5 mmol/L (ref 3.5–5.1)
Sodium: 139 mmol/L (ref 135–145)
Total Protein: 7.9 g/dL (ref 6.5–8.1)

## 2016-06-25 LAB — CBC WITH DIFFERENTIAL/PLATELET
BASOS ABS: 0 10*3/uL (ref 0.0–0.1)
BASOS PCT: 0 %
Eosinophils Absolute: 0.3 10*3/uL (ref 0.0–0.7)
Eosinophils Relative: 4 %
HEMATOCRIT: 35.6 % — AB (ref 36.0–46.0)
Hemoglobin: 11.2 g/dL — ABNORMAL LOW (ref 12.0–15.0)
Lymphocytes Relative: 26 %
Lymphs Abs: 1.8 10*3/uL (ref 0.7–4.0)
MCH: 28.3 pg (ref 26.0–34.0)
MCHC: 31.5 g/dL (ref 30.0–36.0)
MCV: 89.9 fL (ref 78.0–100.0)
MONO ABS: 1 10*3/uL (ref 0.1–1.0)
Monocytes Relative: 15 %
NEUTROS ABS: 3.8 10*3/uL (ref 1.7–7.7)
Neutrophils Relative %: 55 %
PLATELETS: 206 10*3/uL (ref 150–400)
RBC: 3.96 MIL/uL (ref 3.87–5.11)
RDW: 16.2 % — AB (ref 11.5–15.5)
WBC: 7 10*3/uL (ref 4.0–10.5)

## 2016-06-25 LAB — PROTIME-INR
INR: 2.36
PROTHROMBIN TIME: 26.2 s — AB (ref 11.4–15.2)

## 2016-06-25 LAB — TROPONIN I: Troponin I: <0.03 ng/mL (ref <0.03)

## 2016-06-25 LAB — I-STAT TROPONIN, ED: Troponin i, poc: 0 ng/mL (ref 0.00–0.08)

## 2016-06-25 MED ORDER — CARVEDILOL 25 MG PO TABS
50.0000 mg | ORAL_TABLET | Freq: Two times a day (BID) | ORAL | Status: DC
  Administered 2016-06-25 – 2016-06-29 (×9): 50 mg via ORAL
  Filled 2016-06-25 (×8): qty 2
  Filled 2016-06-25: qty 4
  Filled 2016-06-25 (×2): qty 2

## 2016-06-25 MED ORDER — LOSARTAN POTASSIUM 50 MG PO TABS: 100.0000 mg | ORAL_TABLET | Freq: Every day | ORAL | Status: DC

## 2016-06-25 MED ORDER — IOPAMIDOL (ISOVUE-300) INJECTION 61%
INTRAVENOUS | Status: AC
  Administered 2016-06-25: 100 mL
  Filled 2016-06-25: qty 100

## 2016-06-25 MED ORDER — ONDANSETRON HCL 4 MG PO TABS
4.0000 mg | ORAL_TABLET | Freq: Four times a day (QID) | ORAL | Status: DC | PRN
  Filled 2016-06-25: qty 1

## 2016-06-25 MED ORDER — SODIUM CHLORIDE 0.9% FLUSH
3.0000 mL | Freq: Two times a day (BID) | INTRAVENOUS | Status: DC
  Administered 2016-06-25 – 2016-07-17 (×27): 3 mL via INTRAVENOUS

## 2016-06-25 MED ORDER — HYDROCODONE-ACETAMINOPHEN 5-325 MG PO TABS
1.0000 | ORAL_TABLET | ORAL | Status: DC | PRN
  Administered 2016-06-27 – 2016-07-02 (×5): 2 via ORAL
  Administered 2016-07-02: 1 via ORAL
  Administered 2016-07-02 – 2016-07-04 (×5): 2 via ORAL
  Administered 2016-07-05: 1 via ORAL
  Administered 2016-07-05 – 2016-07-10 (×7): 2 via ORAL
  Filled 2016-06-25 (×7): qty 2
  Filled 2016-06-25: qty 1
  Filled 2016-06-25 (×2): qty 2
  Filled 2016-06-25: qty 1
  Filled 2016-06-25 (×2): qty 2
  Filled 2016-06-25 (×2): qty 1
  Filled 2016-06-25 (×5): qty 2

## 2016-06-25 MED ORDER — ONDANSETRON HCL 4 MG/2ML IJ SOLN
4.0000 mg | Freq: Four times a day (QID) | INTRAMUSCULAR | Status: DC | PRN
  Administered 2016-06-28 – 2016-07-15 (×10): 4 mg via INTRAVENOUS
  Filled 2016-06-25 (×9): qty 2

## 2016-06-25 MED ORDER — METRONIDAZOLE 500 MG PO TABS
500.0000 mg | ORAL_TABLET | Freq: Three times a day (TID) | ORAL | Status: AC
  Administered 2016-06-26 – 2016-07-02 (×20): 500 mg via ORAL
  Filled 2016-06-25 (×21): qty 1

## 2016-06-25 MED ORDER — CARVEDILOL 25 MG PO TABS: 50.0000 mg | ORAL_TABLET | Freq: Two times a day (BID) | ORAL | Status: DC

## 2016-06-25 MED ORDER — CIPROFLOXACIN HCL 500 MG PO TABS
500.0000 mg | ORAL_TABLET | ORAL | Status: DC
  Filled 2016-06-25: qty 1

## 2016-06-25 MED ORDER — ACETAMINOPHEN 325 MG PO TABS
650.0000 mg | ORAL_TABLET | Freq: Four times a day (QID) | ORAL | Status: DC | PRN
  Administered 2016-06-25: 650 mg via ORAL
  Filled 2016-06-25: qty 2

## 2016-06-25 MED ORDER — WARFARIN - PHARMACIST DOSING INPATIENT: Freq: Every day | Status: DC

## 2016-06-25 MED ORDER — WARFARIN SODIUM 5 MG PO TABS
5.0000 mg | ORAL_TABLET | Freq: Once | ORAL | Status: AC
  Administered 2016-06-25: 5 mg via ORAL
  Filled 2016-06-25: qty 1

## 2016-06-25 MED ORDER — ACETAMINOPHEN 650 MG RE SUPP: 650.0000 mg | Freq: Four times a day (QID) | RECTAL | Status: DC | PRN

## 2016-06-25 MED ORDER — LOSARTAN POTASSIUM 50 MG PO TABS
100.0000 mg | ORAL_TABLET | Freq: Every day | ORAL | Status: DC
  Administered 2016-06-25 – 2016-06-28 (×4): 100 mg via ORAL
  Filled 2016-06-25 (×5): qty 2

## 2016-06-25 NOTE — Progress Notes (Addendum)
ANTICOAGULATION CONSULT NOTE - Initial Consult  Pharmacy Consult for Coumadin Indication:  H/o  atrial fibrillation (on chronic coumadin PTA)  Allergies  Allergen Reactions  . Tape Other (See Comments)    PLASTIC TAPE TEARS OFF THE SKIN AND BRUISES IT TERRIBLY!!    Patient Measurements: Height: 5\' 5"  (165.1 cm) Weight: 242 lb 4.8 oz (109.9 kg) IBW/kg (Calculated) : 57 Heparin Dosing Weight: Vital Signs: Temp: 98.7 F (37.1 C) (12/06 1223) Temp Source: Oral (12/06 1223) BP: 171/52 (12/06 2117) Pulse Rate: 64 (12/06 2030)  Labs:  Recent Labs  06/25/16 1604  HGB 11.2*  HCT 35.6*  PLT 206  LABPROT 26.2*  INR 2.36  CREATININE 3.48*    Estimated Creatinine Clearance: 18 mL/min (by C-G formula based on SCr of 3.48 mg/dL (H)).   Medical History: Past Medical History:  Diagnosis Date  . Acid reflux   . Atrial fibrillation ([place])   . Hypertension   . Renal disorder   . Sleep apnea     Medications:  Prescriptions Prior to Admission  Medication Sig Dispense Refill Last Dose  . acetaminophen (TYLENOL) 500 MG tablet Take 1,000 mg by mouth 2 (two) times daily as needed (for pain).   06/24/2016 at pm  . amiodarone (PACERONE) 200 MG tablet Take 200 mg by mouth daily.   06/24/2016 at am  . Biotin 10000 MCG TABS Take 1 tablet by mouth daily.   06/24/2016 at am  . carvedilol (COREG) 25 MG tablet Take 50 mg by mouth 2 (two) times daily with a meal.   06/24/2016 at 1700  . cinacalcet (SENSIPAR) 30 MG tablet Take 30 mg by mouth daily.   06/24/2016 at pm  . docusate sodium (PHILLIPS STOOL SOFTENER) 100 MG capsule Take 200 mg by mouth 2 (two) times daily.   06/23/2016 at pm  . furosemide (LASIX) 80 MG tablet Take 80 mg by mouth every morning.   06/24/2016 at am  . losartan (COZAAR) 100 MG tablet Take 100 mg by mouth at bedtime.    06/24/2016 at pm  . multivitamin (RENA-VIT) TABS tablet Take 1 tablet by mouth daily.   06/24/2016 at am  . polyethylene glycol powder (GLYCOLAX/MIRALAX) powder  Take 17 g by mouth every other day. MIX AND DRINK   Past Week at Unknown time  . sevelamer carbonate (RENVELA) 800 MG tablet Take 800-1,600 mg by mouth See admin instructions. 1,600 mg after meals TWO times a day and 800 mg after each snack   06/24/2016 at pm  . warfarin (COUMADIN) 5 MG tablet Take 5-7.5 mg by mouth See admin instructions. 5 mg at bedtime on Sun/Mon/Tues/Thurs/Sat and 7.5 mg on Wed/Fri   06/24/2016 at pm  . cephALEXin (KEFLEX) 500 MG capsule Take 1 capsule (500 mg total) by mouth 2 (two) times daily. (Patient not taking: Reported on 06/25/2016) 8 capsule 0 Not Taking at Unknown time  . warfarin (COUMADIN) 5 MG tablet Take 1 tablet (5 mg total) by mouth daily. (Patient not taking: Reported on 06/25/2016)   Not Taking at Unknown time   Scheduled:  . carvedilol  50 mg Oral BID WC  . ciprofloxacin  500 mg Oral Q24H  . losartan  100 mg Oral QHS  . metroNIDAZOLE  500 mg Oral Q8H  . sodium chloride flush  3 mL Intravenous Q12H    Assessment:  70 y.o female who developed right-sided anterior chest pain while at dialysis this afternoon lasting about 1.5 hours, resolved after treatment with aspirin and sublingual nitroglycerin  On coumadin PTA for h/o Afib.  On coumadin 5 mg daily at bedtime except 7.5 mg qWed/Friday, last taken on 06/24/16 PM.  Pharmacy consulted to continue her coumadin dosing/monitoring.   INR on admit 06/25/16 is 2.36, therapeutic.   Hgb 11.2 slightly low., PLTC is wnl at 206k. No bleeding reported.    Goal of Therapy:  INR 2-3 Monitor platelets by anticoagulation protocol: Yes   Plan:  Coumadin 5 mg tonight, reduced from PTA dose due to starting Cipro & Flagyl Daily PT/INR for at least 3 days.  Thank you for allowing pharmacy to be part of this patients care team. Nicole Cella, RPh Clinical Pharmacist Pager: [phone] 06/25/2016,9:52 PM

## 2016-06-25 NOTE — ED Provider Notes (Signed)
[facility] DEPT Provider Note   CSN: [id number] Arrival date & time: 06/25/16  1215     History   Chief Complaint Chief Complaint  Patient presents with  . Chest Pain  . Abdominal Pain    HPI Jane [name] is a 70 y.o. female  Female with pmh of ESRD on HD M/W/F (dialyzed today), Afib, long term anticoagulation on coumadin, HTN who presents to the ED with cc of CP and Abdominal Pain.   1) patient was at HD today when she developed severe Right sided, heavy CP radiating to her back with associated nausea and Sob lasting >1 hour. She states that it resolved after nitroglycerine and she is Currently chest pain-free. Patient also states that she has never had any exertional anginal symptoms previously and denies a history of coronary artery disease. 2) the patient also complains of left lower quadrant abdominal pain that has been ongoing for the past 4 months. She states that she has mentioned it to her physician several times, however, it has been progressively worsening. She states that at times it is severe and currently rates her pain at a 6 out of 10, which she says is moderate. She states it is severe, both at rest and is worse when ambulating. She states that it used to be intermittent, but over the past week or 2 has been constant and severe. She denies any constipation, melena, hematochezia. She hasn't had a previous colonoscopy and does not have a previous history of diverticula on her examination.    HPI  Past Medical History:  Diagnosis Date  . Acid reflux   . Atrial fibrillation ([place])   . Hypertension   . Renal disorder   . Sleep apnea     Patient Active Problem List   Diagnosis Date Noted  . Acute lower GI bleeding 06/30/2012  . Chronic kidney disease, stage IV (severe) ([place]) 06/30/2012  . Atrial fibrillation ([place]) 06/30/2012  . Hypertension 06/30/2012  . UTI (lower urinary tract infection) 06/30/2012    Past Surgical History:  Procedure Laterality Date  . KNEE  ARTHROPLASTY    . TUBAL LIGATION      OB History    No data available       Home Medications    Prior to Admission medications   Medication Sig Start Date End Date Taking? Authorizing Provider  amiodarone (PACERONE) 200 MG tablet Take 200 mg by mouth daily.    Historical Provider, MD  carvedilol (COREG) 25 MG tablet Take 50 mg by mouth 2 (two) times daily with a meal.    Historical Provider, MD  cephALEXin (KEFLEX) 500 MG capsule Take 1 capsule (500 mg total) by mouth 2 (two) times daily. 07/02/12   Hosie Poisson, MD  cholecalciferol (VITAMIN D) 1000 UNITS tablet Take 1,000 Units by mouth daily.    Historical Provider, MD  co-enzyme Q-10 30 MG capsule Take 100 mg by mouth daily.    Historical Provider, MD  colchicine 0.6 MG tablet Take 0.6 mg by mouth daily as needed. gout    Historical Provider, MD  ferrous sulfate 325 (65 FE) MG tablet Take 325 mg by mouth 2 (two) times daily.    Historical Provider, MD  furosemide (LASIX) 20 MG tablet Take 20 mg by mouth 2 (two) times daily.    Historical Provider, MD  pantoprazole (PROTONIX) 40 MG tablet Take 40 mg by mouth daily.    Historical Provider, MD  spironolactone (ALDACTONE) 25 MG tablet Take 12.5 mg by mouth 2 (  two) times daily.    Historical Provider, MD  thiamine (VITAMIN B-1) 100 MG tablet Take 100 mg by mouth daily.    Historical Provider, MD  vitamin C (ASCORBIC ACID) 500 MG tablet Take 500 mg by mouth daily.    Historical Provider, MD  warfarin (COUMADIN) 5 MG tablet Take 1 tablet (5 mg total) by mouth daily. 07/02/12   Hosie Poisson, MD    Family History Family History  Problem Relation Age of Onset  . Heart failure Mother   . Stroke Mother   . Other Father     Social History Social History  Substance Use Topics  . Smoking status: Never Smoker  . Smokeless tobacco: Not on file  . Alcohol use No     Allergies   Patient has no known allergies.   Review of Systems Review of Systems Ten systems reviewed and are  negative for acute change, except as noted in the HPI.    Physical Exam Updated Vital Signs BP 181/63   Pulse 70   Temp 98.7 F (37.1 C) (Oral)   Resp 20   SpO2 100%   Physical Exam  Constitutional: She is oriented to person, place, and time. She appears well-developed and well-nourished. No distress.  HENT:  Head: Normocephalic and atraumatic.  Eyes: Conjunctivae are normal. No scleral icterus.  Neck: Normal range of motion.  Cardiovascular: Normal rate, regular rhythm and normal heart sounds.  Exam reveals no gallop and no friction rub.   No murmur heard. Pulmonary/Chest: Effort normal and breath sounds normal. No respiratory distress.  Abdominal: Soft. Bowel sounds are normal. She exhibits no distension and no mass. There is tenderness (left lower quadrant). There is no guarding.  Neurological: She is alert and oriented to person, place, and time.  Skin: Skin is warm and dry. She is not diaphoretic.     ED Treatments / Results  Labs (all labs ordered are listed, but only abnormal results are displayed) Labs Reviewed  PROTIME-INR - Abnormal; Notable for the following:       Result Value   Prothrombin Time 26.2 (*)    All other components within normal limits  CBC WITH DIFFERENTIAL/PLATELET - Abnormal; Notable for the following:    Hemoglobin 11.2 (*)    HCT 35.6 (*)    RDW 16.2 (*)    All other components within normal limits  COMPREHENSIVE METABOLIC PANEL - Abnormal; Notable for the following:    Chloride 95 (*)    Creatinine, Ser 3.48 (*)    GFR calc non Af Amer 12 (*)    GFR calc Af Amer 14 (*)    All other components within normal limits  I-STAT TROPOININ, ED    EKG  EKG Interpretation  Date/Time:  Wednesday June 25 2016 12:19:11 EST Ventricular Rate:  66 PR Interval:  188 QRS Duration: 88 QT Interval:  458 QTC Calculation: 480 R Axis:   -11 Text Interpretation:  Normal sinus rhythm Cannot rule out Anterior infarct , age undetermined Abnormal ECG  No significant change since last tracing Confirmed by Winfred Leeds  MD, SAM [phone]) on 06/25/2016 4:55:41 PM       Radiology Dg Chest 2 View  Result Date: 06/25/2016 CLINICAL DATA:  Right-sided chest pain and shortness of breath, initial encounter EXAM: CHEST  2 VIEW COMPARISON:  None. FINDINGS: Cardiac shadow is at the upper limits of normal in size. Aortic calcifications are noted. Long segment stenting in the region of the right shoulder is noted. The lungs  are well aerated bilaterally. No focal infiltrate or sizable effusion is seen. No bony abnormality is noted. IMPRESSION: No acute abnormality seen. Electronically Signed   By: Inez Catalina M.D.   On: 06/25/2016 13:21   Ct Abdomen Pelvis W Contrast  Result Date: 06/25/2016 CLINICAL DATA:  Initial evaluation for acute left flank pain. EXAM: CT ABDOMEN AND PELVIS WITH CONTRAST TECHNIQUE: Multidetector CT imaging of the abdomen and pelvis was performed using the standard protocol following bolus administration of intravenous contrast. CONTRAST:  170mL ISOVUE-300 IOPAMIDOL (ISOVUE-300) INJECTION 61% COMPARISON:  None available. FINDINGS: Lower chest: Visualized lung bases are clear. Mild cardiomegaly partially visualized. Hepatobiliary: Liver demonstrates a normal contrast enhanced appearance. Gallbladder normal. No biliary dilatation. Pancreas: Pancreas within normal limits. Spleen: Spleen within normal limits. Adrenals/Urinary Tract: Adrenal glands are normal. Kidneys are somewhat small and atrophic bilaterally, compatible with history of end-stage renal disease. Innumerable cystic lesions present. The larger of these lesions measure water density common ir most compatible with cysts. The smaller lesions are too small the characterize. No hydroureter. Bladder largely decompressed. Mild circumferential bladder wall thickening most likely related incomplete distension. Stomach/Bowel: Stomach within normal limits. No evidence for bowel obstruction. Appendix  normal. Sigmoid diverticulosis without imaging findings to suggest acute diverticulitis. No acute inflammatory changes identified about the bowels. Vascular/Lymphatic: Moderate aorto bi-iliac atherosclerotic disease. No aneurysm. Shotty subcentimeter retroperitoneal lymph nodes noted. No pathologically enlarged intra-abdominal or pelvic lymph nodes identified. Reproductive: Uterus within normal limits. Ovaries grossly unremarkable. Other: No free intraperitoneal air. No free fluid. Small fat containing paraumbilical hernia noted. Musculoskeletal: Postsurgical changes noted within the lumbar spine. No acute osseous abnormality. No worrisome lytic or blastic osseous lesions. IMPRESSION: 1. No CT evidence for acute intra-abdominal pelvic process. 2. Sigmoid diverticulosis without evidence for acute diverticulitis. 3. Small and atrophic kidneys bilaterally with multiple cysts present, compatible with history of end-stage renal disease. Electronically Signed   By: Jeannine Boga M.D.   On: 06/25/2016 17:38    Procedures Procedures (including critical care time)  Medications Ordered in ED Medications  iopamidol (ISOVUE-300) 61 % injection (100 mLs  Contrast Given 06/25/16 1711)     Initial Impression / Assessment and Plan / ED Course  I have reviewed the triage vital signs and the nursing notes.  Pertinent labs & imaging results that were available during my care of the patient were reviewed by me and considered in my medical decision making (see chart for details).  Clinical Course     Patient CT scan negative for any acute intra-abdominal pathology. She has a heart score of 6, making her on the high end of the moderate risk scale. Her story is concerning for cardiac etiology. The patient has been seen in shared visit with attending physician. We feel she will need a cardiac workup, given she has no previous known history of coronary artery disease. She is stable throughout her visit  here.  Final Clinical Impressions(s) / ED Diagnoses   Final diagnoses:  Chest pain with moderate risk for cardiac etiology    New Prescriptions New Prescriptions   No medications on file     Margarita Mail, PA-C 06/26/16 [place], MD 06/26/16 1312

## 2016-06-25 NOTE — ED Provider Notes (Signed)
Patient developed right-sided anterior chest pain while at dialysis this afternoon lasting about 1.5 hours, resolved after treatment with aspirin and sublingual nitroglycerin. He also reports left lower quadrant abdominal pain which is been constant for the past 4 months, occurring daily. Worsening over the past few weeks. Pain is worse with walking improved with remaining still. Abdominal Pain is mild at present. He has no chest pain presently   Orlie Dakin, MD 06/25/16 1656

## 2016-06-25 NOTE — H&P (Signed)
History and Physical  Patient Name: Jane [name]     SFK:[id number]    DOB: Mar 05, 1946    DOA: 06/25/2016 PCP: [facility]   Patient coming from: Dialysis center     Chief Complaint: Chest pain  HPI: Jane [name] is a 70 y.o. female with a past medical history significant for ESRD on HD MWF, Afib on warfarin, HTN, and anemia who presents with chest pain for 1 day and abdominal pain for 3 months.  Regarding chest pain, this was a new complaint today.  The patient went to HD, and during her session, developed a sharp, severe, constant pain under her right breast associated with shortness of breath and nausea.  She finished her treatment, then the HD nurse administered O2 and nitroglycerin, which caused her pain to subside, and recommended she go to the ER.  ED course: -Afebrile, heart rate 65, respirations 20, pulse ox normal, Bp 161/68 -Initial ECG showed nonspecific T wave changes and troponin was negative. -Na 139, K 3.5, Cr 3.48 (baseline in HD), WBC 7.0, Hgb 11.2 -INR was therapeutic   In addition, the patient noted some LLQ abdominal pain over months.  This was initially intermittent, aching, mild-to-moderate.  Over time, it has progressed until now it is there almost all the time, and much more severe at times (for example, last night made her get out of bed from pain and walk around).  It is without diarrhea, hematochezia (just her chronic blood on the toilet tissue), melena, vomiting, fever, or chills.    She saw her PCP about this recently, described it as post-prandial to him (to me she says it is after eating sometimes and sometimes spontaneous).  He was concerned about worsening post-prandial pain being mesenteric ischemia and ordered lactic acid (normal) and mesenteric duplex US which hasn't been done yet.    Today, in the ER, the patient had a CT of the abdomen and pelvis with contrast that showed a subtle complex structure in the area of the colon, consistent with  phlegmon, no drainable abscess, interpreted as contained perforation.     Review of Systems:  Review of Systems  Constitutional: Negative for chills and fever.  Respiratory: Negative for cough and shortness of breath.   Cardiovascular: Positive for chest pain. Negative for orthopnea and leg swelling.  Gastrointestinal: Positive for abdominal pain. Negative for blood in stool, diarrhea, melena, nausea and vomiting.  All other systems reviewed and are negative.    Past Medical History:  Diagnosis Date  . Acid reflux   . Atrial fibrillation ([place])   . Hypertension   . Renal disorder   . Sleep apnea     Past Surgical History:  Procedure Laterality Date  . KNEE ARTHROPLASTY    . TUBAL LIGATION      Social History: Patient lives with her daughter.  Patient walks unassisted.  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  Nonsmoker.    No Known Allergies  Family history: family history includes Heart failure in her mother; Other in her father; Stroke in her mother.  Prior to Admission medications   Medication Sig Start Date End Date Taking? Authorizing Provider  amiodarone (PACERONE) 200 MG tablet Take 200 mg by mouth daily.    Historical Provider, MD  carvedilol (COREG) 25 MG tablet Take 50 mg by mouth 2 (two) times daily with a meal.    Historical Provider, MD  cephALEXin (KEFLEX) 500 MG capsule Take 1 capsule (500 mg total)  by mouth 2 (two) times daily. 07/02/12   Hosie Poisson, MD  cholecalciferol (VITAMIN D) 1000 UNITS tablet Take 1,000 Units by mouth daily.    Historical Provider, MD  co-enzyme Q-10 30 MG capsule Take 100 mg by mouth daily.    Historical Provider, MD  colchicine 0.6 MG tablet Take 0.6 mg by mouth daily as needed. gout    Historical Provider, MD  ferrous sulfate 325 (65 FE) MG tablet Take 325 mg by mouth 2 (two) times daily.    Historical Provider, MD  furosemide (LASIX) 20 MG tablet Take 20 mg by mouth 2 (two) times daily.    Historical  Provider, MD  pantoprazole (PROTONIX) 40 MG tablet Take 40 mg by mouth daily.    Historical Provider, MD  spironolactone (ALDACTONE) 25 MG tablet Take 12.5 mg by mouth 2 (two) times daily.    Historical Provider, MD  thiamine (VITAMIN B-1) 100 MG tablet Take 100 mg by mouth daily.    Historical Provider, MD  vitamin C (ASCORBIC ACID) 500 MG tablet Take 500 mg by mouth daily.    Historical Provider, MD  warfarin (COUMADIN) 5 MG tablet Take 1 tablet (5 mg total) by mouth daily. 07/02/12   Hosie Poisson, MD       Physical Exam: BP 181/63   Pulse 70   Temp 98.7 F (37.1 C) (Oral)   Resp 20   SpO2 100%  General appearance: Well-developed, adult female, alert and in no acute distress.   Eyes: Anicteric, conjunctiva pink, lids and lashes normal.     ENT: No nasal deformity, discharge, or epistaxis.  OP moist without lesions.   Skin: Warm and dry.   Cardiac: RRR, nl S1-S2, soft SEM.  Capillary refill is brisk.  JVP normal.  No LE edema.  Radial and DP pulses 2+ and symmetric.  No carotid bruits. Respiratory: Normal respiratory rate and rhythm.  CTAB without rales or wheezes. GI: Abdomen soft without rigidity.  Mild LLQ TTP no rebound, mild guarding. No ascites, distension.   MSK: No deformities or effusions.   Pain not reproduced with palpation of precordium.  No pain with arm movement. Neuro: Sensorium intact and responding to questions, attention normal.  Speech is fluent.  Moves all extremities equally and with normal coordination.   Cranial nerves normal.  Mild nystagmus, all directions. Psych: Behavior appropriate.  Affect normal.  No evidence of aural or visual hallucinations or delusions.       Labs on Admission:  The metabolic panel shows elevated creatinine chronically, otherwise normal electrolytes. The complete blood count shows no leukocytosis, thrombocytopenia.  Stable anemia. The initial troponin is negative. INR therapeutic.     Radiological Exams on  Admission: Personally reviewed CXR shows no focal opacity: Dg Chest 2 View  Result Date: 06/25/2016 CLINICAL DATA:  Right-sided chest pain and shortness of breath, initial encounter EXAM: CHEST  2 VIEW COMPARISON:  None. FINDINGS: Cardiac shadow is at the upper limits of normal in size. Aortic calcifications are noted. Long segment stenting in the region of the right shoulder is noted. The lungs are well aerated bilaterally. No focal infiltrate or sizable effusion is seen. No bony abnormality is noted. IMPRESSION: No acute abnormality seen. Electronically Signed   By: Inez Catalina M.D.   On: 06/25/2016 13:21   Ct Abdomen Pelvis W Contrast  Result Date: 06/25/2016 CLINICAL DATA:  Initial evaluation for acute left flank pain. EXAM: CT ABDOMEN AND PELVIS WITH CONTRAST TECHNIQUE: Multidetector CT imaging of the abdomen  and pelvis was performed using the standard protocol following bolus administration of intravenous contrast. CONTRAST:  147mL ISOVUE-300 IOPAMIDOL (ISOVUE-300) INJECTION 61% COMPARISON:  None available. FINDINGS: Lower chest: Visualized lung bases are clear. Mild cardiomegaly partially visualized. Hepatobiliary: Liver demonstrates a normal contrast enhanced appearance. Gallbladder normal. No biliary dilatation. Pancreas: Pancreas within normal limits. Spleen: Spleen within normal limits. Adrenals/Urinary Tract: Adrenal glands are normal. Kidneys are somewhat small and atrophic bilaterally, compatible with history of end-stage renal disease. Innumerable cystic lesions present. The larger of these lesions measure water density common ir most compatible with cysts. The smaller lesions are too small the characterize. No hydroureter. Bladder largely decompressed. Mild circumferential bladder wall thickening most likely related incomplete distension. Stomach/Bowel: Stomach within normal limits. No evidence for bowel obstruction. Appendix normal. Sigmoid diverticulosis without imaging findings to suggest  acute diverticulitis. No acute inflammatory changes identified about the bowels. Vascular/Lymphatic: Moderate aorto bi-iliac atherosclerotic disease. No aneurysm. Shotty subcentimeter retroperitoneal lymph nodes noted. No pathologically enlarged intra-abdominal or pelvic lymph nodes identified. Reproductive: Uterus within normal limits. Ovaries grossly unremarkable. Other: No free intraperitoneal air. No free fluid. Small fat containing paraumbilical hernia noted. Musculoskeletal: Postsurgical changes noted within the lumbar spine. No acute osseous abnormality. No worrisome lytic or blastic osseous lesions. IMPRESSION: 1. No CT evidence for acute intra-abdominal pelvic process. 2. Sigmoid diverticulosis without evidence for acute diverticulitis. 3. Small and atrophic kidneys bilaterally with multiple cysts present, compatible with history of end-stage renal disease. Electronically Signed   By: Jeannine Boga M.D.   On: 06/25/2016 17:38    EKG: Independently reviewed. Rate 66, QTc 480, nonspecific T Wave changes.  No ST changes.        Assessment/Plan  1. Chest pain: This is new.  This is atypical in character.   -Serial troponins are ordered -Telemetry    2. Microperforated diverticulitis:  This is new.  Mesenteric ischemia was considered but this was reviewed with vascular surgery and radiology, who felt there was no vascular occlusion on  CT and only mild calcifications present at the origin of the SMA.  Rather, her pain seems to correlate with an indistinct abnormality on CT that most likely represents a microperforation of diverticulitis, which would match the chronicity and character of her complaint as well. -Bowel rest  -IV ciprofloxacin and metronidazole -If no improvement over next 1-2 days, will consult General Surgery   3. Afib:  CHADS2Vasc 3.  On warfarin. -Continue warfarin, per pharmacy -Continue carvedilol, amiodarone  4. HTN:  -Continue losartan,  furosemide  5. ESRD on HD MWF:  -Continue Renvela, Sensipar         DVT prophylaxis: N/A on warfarin Diet: Clears Code Status: FULL  Family Communication: Daughter at bedside  Disposition Plan: Anticipate admission to inpatient first for cardiac rule out, but also for IV antibiotics for diverticulitis with microperforation. Consults called: None Admission status: Telemetry, INPATIENT   Medical decision making: Patient seen at 7:30 PM on 06/25/2016.  The patient was discussed with General Srugery, Vascular surgery, Radiology. What exists of the patient's chart was reviewed in depth.  Clinical condition: stable.      Edwin Dada [facility] Pager [phone]

## 2016-06-25 NOTE — ED Notes (Signed)
Patient transported to CT 

## 2016-06-25 NOTE — ED Triage Notes (Signed)
Pt reports ongoing LLQ pain, has been to pcp about it and had labs done. Dialysis pt, had onset of right side chest pain during dialysis today. Had sob when pain occurred. No vomiting. ekg done at triage.

## 2016-06-26 DIAGNOSIS — R079 Chest pain, unspecified: Secondary | ICD-10-CM | POA: Diagnosis not present

## 2016-06-26 DIAGNOSIS — N390 Urinary tract infection, site not specified: Secondary | ICD-10-CM | POA: Diagnosis not present

## 2016-06-26 DIAGNOSIS — G4730 Sleep apnea, unspecified: Secondary | ICD-10-CM | POA: Diagnosis present

## 2016-06-26 DIAGNOSIS — E8770 Fluid overload, unspecified: Secondary | ICD-10-CM | POA: Diagnosis not present

## 2016-06-26 DIAGNOSIS — R634 Abnormal weight loss: Secondary | ICD-10-CM | POA: Diagnosis present

## 2016-06-26 DIAGNOSIS — R1032 Left lower quadrant pain: Secondary | ICD-10-CM | POA: Diagnosis not present

## 2016-06-26 DIAGNOSIS — Z452 Encounter for adjustment and management of vascular access device: Secondary | ICD-10-CM | POA: Diagnosis not present

## 2016-06-26 DIAGNOSIS — K5730 Diverticulosis of large intestine without perforation or abscess without bleeding: Secondary | ICD-10-CM | POA: Diagnosis not present

## 2016-06-26 DIAGNOSIS — K5720 Diverticulitis of large intestine with perforation and abscess without bleeding: Secondary | ICD-10-CM | POA: Diagnosis not present

## 2016-06-26 DIAGNOSIS — Z992 Dependence on renal dialysis: Secondary | ICD-10-CM | POA: Diagnosis not present

## 2016-06-26 DIAGNOSIS — I120 Hypertensive chronic kidney disease with stage 5 chronic kidney disease or end stage renal disease: Secondary | ICD-10-CM | POA: Diagnosis not present

## 2016-06-26 DIAGNOSIS — I480 Paroxysmal atrial fibrillation: Secondary | ICD-10-CM

## 2016-06-26 DIAGNOSIS — K5732 Diverticulitis of large intestine without perforation or abscess without bleeding: Secondary | ICD-10-CM | POA: Diagnosis not present

## 2016-06-26 DIAGNOSIS — M898X9 Other specified disorders of bone, unspecified site: Secondary | ICD-10-CM | POA: Diagnosis present

## 2016-06-26 DIAGNOSIS — K922 Gastrointestinal hemorrhage, unspecified: Secondary | ICD-10-CM | POA: Diagnosis not present

## 2016-06-26 DIAGNOSIS — R0789 Other chest pain: Secondary | ICD-10-CM | POA: Diagnosis not present

## 2016-06-26 DIAGNOSIS — K913 Postprocedural intestinal obstruction, unspecified as to partial versus complete: Secondary | ICD-10-CM | POA: Diagnosis not present

## 2016-06-26 DIAGNOSIS — D631 Anemia in chronic kidney disease: Secondary | ICD-10-CM | POA: Diagnosis not present

## 2016-06-26 DIAGNOSIS — R110 Nausea: Secondary | ICD-10-CM | POA: Diagnosis present

## 2016-06-26 DIAGNOSIS — K660 Peritoneal adhesions (postprocedural) (postinfection): Secondary | ICD-10-CM | POA: Diagnosis present

## 2016-06-26 DIAGNOSIS — I10 Essential (primary) hypertension: Secondary | ICD-10-CM | POA: Diagnosis not present

## 2016-06-26 DIAGNOSIS — Z96652 Presence of left artificial knee joint: Secondary | ICD-10-CM | POA: Diagnosis present

## 2016-06-26 DIAGNOSIS — N2581 Secondary hyperparathyroidism of renal origin: Secondary | ICD-10-CM | POA: Diagnosis not present

## 2016-06-26 DIAGNOSIS — E871 Hypo-osmolality and hyponatremia: Secondary | ICD-10-CM | POA: Diagnosis not present

## 2016-06-26 DIAGNOSIS — Y92239 Unspecified place in hospital as the place of occurrence of the external cause: Secondary | ICD-10-CM | POA: Diagnosis not present

## 2016-06-26 DIAGNOSIS — Z01810 Encounter for preprocedural cardiovascular examination: Secondary | ICD-10-CM | POA: Diagnosis not present

## 2016-06-26 DIAGNOSIS — D638 Anemia in other chronic diseases classified elsewhere: Secondary | ICD-10-CM | POA: Diagnosis not present

## 2016-06-26 DIAGNOSIS — Z7901 Long term (current) use of anticoagulants: Secondary | ICD-10-CM | POA: Diagnosis not present

## 2016-06-26 DIAGNOSIS — I959 Hypotension, unspecified: Secondary | ICD-10-CM | POA: Diagnosis not present

## 2016-06-26 DIAGNOSIS — N186 End stage renal disease: Secondary | ICD-10-CM | POA: Diagnosis not present

## 2016-06-26 DIAGNOSIS — Y838 Other surgical procedures as the cause of abnormal reaction of the patient, or of later complication, without mention of misadventure at the time of the procedure: Secondary | ICD-10-CM | POA: Diagnosis not present

## 2016-06-26 DIAGNOSIS — R0602 Shortness of breath: Secondary | ICD-10-CM | POA: Diagnosis not present

## 2016-06-26 DIAGNOSIS — Z91048 Other nonmedicinal substance allergy status: Secondary | ICD-10-CM | POA: Diagnosis not present

## 2016-06-26 DIAGNOSIS — R1030 Lower abdominal pain, unspecified: Secondary | ICD-10-CM | POA: Diagnosis not present

## 2016-06-26 DIAGNOSIS — K5721 Diverticulitis of large intestine with perforation and abscess with bleeding: Secondary | ICD-10-CM | POA: Diagnosis not present

## 2016-06-26 DIAGNOSIS — Z96659 Presence of unspecified artificial knee joint: Secondary | ICD-10-CM | POA: Diagnosis present

## 2016-06-26 LAB — CBC
HCT: 33 % — ABNORMAL LOW (ref 36.0–46.0)
HCT: 34.1 % — ABNORMAL LOW (ref 36.0–46.0)
Hemoglobin: 10.5 g/dL — ABNORMAL LOW (ref 12.0–15.0)
Hemoglobin: 11.1 g/dL — ABNORMAL LOW (ref 12.0–15.0)
MCH: 28.8 pg (ref 26.0–34.0)
MCH: 29.2 pg (ref 26.0–34.0)
MCHC: 31.8 g/dL (ref 30.0–36.0)
MCHC: 32.6 g/dL (ref 30.0–36.0)
MCV: 90.7 fL (ref 78.0–100.0)
MCV: 89.7 fL (ref 78.0–100.0)
PLATELETS: 225 10*3/uL (ref 150–400)
Platelets: 247 10*3/uL (ref 150–400)
RBC: 3.64 MIL/uL — AB (ref 3.87–5.11)
RBC: 3.8 MIL/uL — ABNORMAL LOW (ref 3.87–5.11)
RDW: 16.2 % — ABNORMAL HIGH (ref 11.5–15.5)
RDW: 16.6 % — ABNORMAL HIGH (ref 11.5–15.5)
WBC: 5.8 10*3/uL (ref 4.0–10.5)
WBC: 8.1 10*3/uL (ref 4.0–10.5)

## 2016-06-26 LAB — COMPREHENSIVE METABOLIC PANEL
ALT: 12 U/L — AB (ref 14–54)
AST: 18 U/L (ref 15–41)
Albumin: 3.1 g/dL — ABNORMAL LOW (ref 3.5–5.0)
Alkaline Phosphatase: 105 U/L (ref 38–126)
Anion gap: 7 (ref 5–15)
BUN: 21 mg/dL — AB (ref 6–20)
CHLORIDE: 97 mmol/L — AB (ref 101–111)
CO2: 34 mmol/L — AB (ref 22–32)
CREATININE: 4.63 mg/dL — AB (ref 0.44–1.00)
Calcium: 8.6 mg/dL — ABNORMAL LOW (ref 8.9–10.3)
GFR calc Af Amer: 10 mL/min — ABNORMAL LOW (ref >60)
GFR calc non Af Amer: 9 mL/min — ABNORMAL LOW (ref >60)
Glucose, Bld: 95 mg/dL (ref 65–99)
Potassium: 3.6 mmol/L (ref 3.5–5.1)
SODIUM: 138 mmol/L (ref 135–145)
Total Bilirubin: 0.8 mg/dL (ref 0.3–1.2)
Total Protein: 7 g/dL (ref 6.5–8.1)

## 2016-06-26 LAB — MRSA PCR SCREENING: MRSA by PCR: NEGATIVE

## 2016-06-26 LAB — PROTIME-INR
INR: 2.38
Prothrombin Time: 26.5 seconds — ABNORMAL HIGH (ref 11.4–15.2)

## 2016-06-26 LAB — RENAL FUNCTION PANEL
Albumin: 3.4 g/dL — ABNORMAL LOW (ref 3.5–5.0)
Anion gap: 12 (ref 5–15)
BUN: 27 mg/dL — ABNORMAL HIGH (ref 6–20)
CO2: 28 mmol/L (ref 22–32)
Calcium: 8.5 mg/dL — ABNORMAL LOW (ref 8.9–10.3)
Chloride: 94 mmol/L — ABNORMAL LOW (ref 101–111)
Creatinine, Ser: 5.19 mg/dL — ABNORMAL HIGH (ref 0.44–1.00)
GFR calc Af Amer: 9 mL/min — ABNORMAL LOW (ref >60)
GFR calc non Af Amer: 8 mL/min — ABNORMAL LOW (ref >60)
Glucose, Bld: 102 mg/dL — ABNORMAL HIGH (ref 65–99)
Phosphorus: 3.9 mg/dL (ref 2.5–4.6)
Potassium: 3.6 mmol/L (ref 3.5–5.1)
Sodium: 134 mmol/L — ABNORMAL LOW (ref 135–145)

## 2016-06-26 LAB — TROPONIN I
Troponin I: <0.03 ng/mL (ref <0.03)
Troponin I: <0.03 ng/mL (ref <0.03)

## 2016-06-26 MED ORDER — LIDOCAINE-PRILOCAINE 2.5-2.5 % EX CREA
1.0000 "application " | TOPICAL_CREAM | CUTANEOUS | Status: DC | PRN
  Filled 2016-06-26: qty 5

## 2016-06-26 MED ORDER — ALTEPLASE 2 MG IJ SOLR: 2.0000 mg | Freq: Once | INTRAMUSCULAR | Status: DC | PRN

## 2016-06-26 MED ORDER — LIDOCAINE HCL (PF) 1 % IJ SOLN: 5.0000 mL | INTRAMUSCULAR | Status: DC | PRN

## 2016-06-26 MED ORDER — CIPROFLOXACIN IN D5W 400 MG/200ML IV SOLN
400.0000 mg | INTRAVENOUS | Status: DC
  Administered 2016-06-26 – 2016-06-30 (×5): 400 mg via INTRAVENOUS
  Filled 2016-06-26 (×6): qty 200

## 2016-06-26 MED ORDER — CINACALCET HCL 30 MG PO TABS
30.0000 mg | ORAL_TABLET | Freq: Every day | ORAL | Status: DC
Start: 2016-06-26 — End: 2016-06-28
  Filled 2016-06-26 (×3): qty 1

## 2016-06-26 MED ORDER — SEVELAMER CARBONATE 800 MG PO TABS
1600.0000 mg | ORAL_TABLET | Freq: Two times a day (BID) | ORAL | Status: DC
  Administered 2016-06-27 – 2016-07-02 (×10): 1600 mg via ORAL
  Filled 2016-06-26 (×11): qty 2

## 2016-06-26 MED ORDER — SODIUM CHLORIDE 0.9 % IV SOLN: 100.0000 mL | INTRAVENOUS | Status: DC | PRN

## 2016-06-26 MED ORDER — WARFARIN SODIUM 5 MG PO TABS
5.0000 mg | ORAL_TABLET | Freq: Once | ORAL | Status: AC
  Administered 2016-06-26: 5 mg via ORAL
  Filled 2016-06-26: qty 1

## 2016-06-26 MED ORDER — SENNOSIDES-DOCUSATE SODIUM 8.6-50 MG PO TABS
1.0000 | ORAL_TABLET | Freq: Two times a day (BID) | ORAL | Status: DC
  Administered 2016-06-26 – 2016-06-30 (×9): 1 via ORAL
  Filled 2016-06-26 (×10): qty 1

## 2016-06-26 MED ORDER — HEPARIN SODIUM (PORCINE) 1000 UNIT/ML DIALYSIS
1000.0000 [IU] | INTRAMUSCULAR | Status: DC | PRN
  Filled 2016-06-26: qty 1

## 2016-06-26 MED ORDER — FUROSEMIDE 80 MG PO TABS
80.0000 mg | ORAL_TABLET | Freq: Every morning | ORAL | Status: DC
  Administered 2016-06-26 – 2016-07-02 (×7): 80 mg via ORAL
  Filled 2016-06-26 (×7): qty 1

## 2016-06-26 MED ORDER — AMIODARONE HCL 200 MG PO TABS
200.0000 mg | ORAL_TABLET | Freq: Every day | ORAL | Status: DC
  Administered 2016-06-26 – 2016-07-18 (×22): 200 mg via ORAL
  Filled 2016-06-26 (×22): qty 1

## 2016-06-26 MED ORDER — DOCUSATE SODIUM 100 MG PO CAPS
200.0000 mg | ORAL_CAPSULE | Freq: Two times a day (BID) | ORAL | Status: DC
  Administered 2016-06-26 – 2016-07-17 (×22): 200 mg via ORAL
  Filled 2016-06-26 (×33): qty 2

## 2016-06-26 MED ORDER — SEVELAMER CARBONATE 800 MG PO TABS
800.0000 mg | ORAL_TABLET | Freq: Two times a day (BID) | ORAL | Status: DC | PRN
  Filled 2016-06-26: qty 1

## 2016-06-26 MED ORDER — PENTAFLUOROPROP-TETRAFLUOROETH EX AERO: 1.0000 "application " | INHALATION_SPRAY | CUTANEOUS | Status: DC | PRN

## 2016-06-26 NOTE — Progress Notes (Signed)
PROGRESS NOTE    Jane [name]  JHE:[id number] DOB: 1946/06/05 DOA: 06/25/2016 PCP: [facility]   Outpatient Specialists:    Brief Narrative:  Jane [name] is a 70 y.o. female with a past medical history significant for ESRD on HD MWF, Afib on warfarin, HTN, and anemia who presents with chest pain for 1 day and abdominal pain for 3 months.  the patient noted some LLQ abdominal pain over months.  This was initially intermittent, aching, mild-to-moderate.  Over time, it has progressed until now it is there almost all the time, and much more severe at times (for example, last night made her get out of bed from pain and walk around).  It is without diarrhea, hematochezia (just her chronic blood on the toilet tissue), melena, vomiting, fever, or chills.    She saw her PCP about this recently, described it as post-prandial to him (to me she says it is after eating sometimes and sometimes spontaneous).  He was concerned about worsening post-prandial pain being mesenteric ischemia and ordered lactic acid (normal) and mesenteric duplex US which hasn't been done yet.    Today, in the ER, the patient had a CT of the abdomen and pelvis with contrast that showed a subtle complex structure in the area of the colon, consistent with phlegmon, no drainable abscess, interpreted as contained perforation.     Assessment & Plan:   Principal Problem:   Diverticulitis of large intestine with perforation Active Problems:   ESRD on hemodialysis (HCC)   Paroxysmal atrial fibrillation (HCC)   Essential hypertension   Anemia in other chronic diseases classified elsewhere   Chest pain   Chest pain: Resolved, seen by cards Outpatient follow up   Microperforated diverticuli -Mesenteric ischemia was considered and the admitting dr reviewed with vascular surgery and radiology, who felt there was no vascular occlusion on  CT and only mild calcifications present at the origin of the SMA.   Rather, her pain seems to correlate with an indistinct abnormality on CT that most likely represents a microperforation of diverticulitis, which would match the chronicity and character of her complaint as well. -clears -IV ciprofloxacin and metronidazole -If no improvement over next 1-2 days, consult General Surgery  Afib:  CHADS2Vasc 3.  On warfarin. -Continue warfarin, per pharmacy -Continue carvedilol, amiodarone  HTN:  -Continue losartan, furosemide  ESRD on HD MWF:  -Continue Renvela, Sensipar -nephrology consulted   DVT prophylaxis:  SCD's  Code Status: Full Code   Family Communication: patient  Disposition Plan:     Consultants:   nephrology     Subjective: LLQ pain  Objective: Vitals:   06/25/16 2030 06/25/16 2112 06/25/16 2117 06/26/16 0526  BP: 143/63  (!) 171/52 (!) 129/40  Pulse: 64   64  Resp: 12   12  Temp:    98.4 F (36.9 C)  TempSrc:    Oral  SpO2: 95%   98%  Weight:  109.9 kg (242 lb 4.8 oz)  110.3 kg (243 lb 3.2 oz)  Height:  5\' 5"  (1.651 m)      Intake/Output Summary (Last 24 hours) at 06/26/16 1007 Last data filed at 06/25/16 2110  Gross per 24 hour  Intake                0 ml  Output              100 ml  Net             -100 ml  Filed Weights   06/25/16 2112 06/26/16 0526  Weight: 109.9 kg (242 lb 4.8 oz) 110.3 kg (243 lb 3.2 oz)    Examination:  General exam: Appears calm and comfortable  Respiratory system: Clear to auscultation. Respiratory effort normal. Cardiovascular system: S1 & S2 heard, RRR. No JVD, murmurs, rubs, gallops or clicks. No pedal edema. Gastrointestinal system: Abdomen is nondistended, tender in LLQ. No organomegaly or masses felt. Normal bowel sounds heard. Central nervous system: Alert and oriented. No focal neurological deficits.     Data Reviewed: I have personally reviewed following labs and imaging studies  CBC:  Recent Labs Lab 06/25/16 1604 06/26/16 0321  WBC 7.0 5.8    NEUTROABS 3.8  --   HGB 11.2* 10.5*  HCT 35.6* 33.0*  MCV 89.9 90.7  PLT 206 106   Basic Metabolic Panel:  Recent Labs Lab 06/25/16 1604 06/26/16 0321  NA 139 138  K 3.5 3.6  CL 95* 97*  CO2 30 34*  GLUCOSE 93 95  BUN 17 21*  CREATININE 3.48* 4.63*  CALCIUM 8.9 8.6*   GFR: Estimated Creatinine Clearance: 13.6 mL/min (by C-G formula based on SCr of 4.63 mg/dL (H)). Liver Function Tests:  Recent Labs Lab 06/25/16 1604 06/26/16 0321  AST 18 18  ALT 15 12*  ALKPHOS 113 105  BILITOT 0.6 0.8  PROT 7.9 7.0  ALBUMIN 3.6 3.1*   No results for input(s): LIPASE, AMYLASE in the last 168 hours. No results for input(s): AMMONIA in the last 168 hours. Coagulation Profile:  Recent Labs Lab 06/25/16 1604  INR 2.36   Cardiac Enzymes:  Recent Labs Lab 06/25/16 2141 06/26/16 0321 06/26/16 0915  TROPONINI <0.03 <0.03 <0.03   BNP (last 3 results) No results for input(s): PROBNP in the last 8760 hours. HbA1C: No results for input(s): HGBA1C in the last 72 hours. CBG: No results for input(s): GLUCAP in the last 168 hours. Lipid Profile: No results for input(s): CHOL, HDL, LDLCALC, TRIG, CHOLHDL, LDLDIRECT in the last 72 hours. Thyroid Function Tests: No results for input(s): TSH, T4TOTAL, FREET4, T3FREE, THYROIDAB in the last 72 hours. Anemia Panel: No results for input(s): VITAMINB12, FOLATE, FERRITIN, TIBC, IRON, RETICCTPCT in the last 72 hours. Urine analysis:    Component Value Date/Time   COLORURINE YELLOW 06/30/2012 2201   APPEARANCEUR CLOUDY (A) 06/30/2012 2201   LABSPEC 1.015 06/30/2012 2201   PHURINE 5.5 06/30/2012 2201   GLUCOSEU NEGATIVE 06/30/2012 2201   HGBUR TRACE (A) 06/30/2012 2201   BILIRUBINUR NEGATIVE 06/30/2012 2201   KETONESUR NEGATIVE 06/30/2012 2201   PROTEINUR 100 (A) 06/30/2012 2201   UROBILINOGEN 0.2 06/30/2012 2201   NITRITE NEGATIVE 06/30/2012 2201   LEUKOCYTESUR SMALL (A) 06/30/2012 2201     ) Recent Results (from the past  240 hour(s))  MRSA PCR Screening     Status: None   Collection Time: 06/25/16 10:25 PM  Result Value Ref Range Status   MRSA by PCR NEGATIVE NEGATIVE Final    Comment:        The GeneXpert MRSA Assay (FDA approved for NASAL specimens only), is one component of a comprehensive MRSA colonization surveillance program. It is not intended to diagnose MRSA infection nor to guide or monitor treatment for MRSA infections.       Anti-infectives    Start     Dose/Rate Route Frequency Ordered Stop   06/26/16 2200  ciprofloxacin (CIPRO) IVPB 400 mg     400 mg 200 mL/hr over 60 Minutes Intravenous Every 24 hours  06/26/16 0035     06/25/16 2200  ciprofloxacin (CIPRO) tablet 500 mg  Status:  Discontinued     500 mg Oral Every 24 hours 06/25/16 2146 06/26/16 0030   06/25/16 2200  metroNIDAZOLE (FLAGYL) tablet 500 mg     500 mg Oral Every 8 hours 06/25/16 2146 07/02/16 2159       Radiology Studies: Dg Chest 2 View  Result Date: 06/25/2016 CLINICAL DATA:  Right-sided chest pain and shortness of breath, initial encounter EXAM: CHEST  2 VIEW COMPARISON:  None. FINDINGS: Cardiac shadow is at the upper limits of normal in size. Aortic calcifications are noted. Long segment stenting in the region of the right shoulder is noted. The lungs are well aerated bilaterally. No focal infiltrate or sizable effusion is seen. No bony abnormality is noted. IMPRESSION: No acute abnormality seen. Electronically Signed   By: Inez Catalina M.D.   On: 06/25/2016 13:21   Ct Abdomen Pelvis W Contrast  Addendum Date: 06/25/2016   ADDENDUM REPORT: 06/25/2016 20:49 ADDENDUM: Upon further review, there are a few small locules of gas positioned just adjacent to the sigmoid colon, which likely reflect a small micro perforation. This is best appreciated on coronal sequence (series 5, image 58). This is closely approximated to the adjacent uterus and left ovary. Additionally, possible small diverticular abscess present adjacent  to the sigmoid measures approximately 2.5 x 1.0 cm (series 2, image 56). Given the relative lack of extensive inflammatory changes, these findings are suspected to have been ongoing/smoldering in nature, which could correlate with patient's waxing and waning symptoms. No definite mass lesion identified. Critical Value/emergent results were called by telephone at the time of interpretation on 06/25/2016 at 8:44 pm to Dr. Norina Buzzard , who verbally acknowledged these results. Electronically Signed   By: Jeannine Boga M.D.   On: 06/25/2016 20:49   Result Date: 06/25/2016 CLINICAL DATA:  Initial evaluation for acute left flank pain. EXAM: CT ABDOMEN AND PELVIS WITH CONTRAST TECHNIQUE: Multidetector CT imaging of the abdomen and pelvis was performed using the standard protocol following bolus administration of intravenous contrast. CONTRAST:  171mL ISOVUE-300 IOPAMIDOL (ISOVUE-300) INJECTION 61% COMPARISON:  None available. FINDINGS: Lower chest: Visualized lung bases are clear. Mild cardiomegaly partially visualized. Hepatobiliary: Liver demonstrates a normal contrast enhanced appearance. Gallbladder normal. No biliary dilatation. Pancreas: Pancreas within normal limits. Spleen: Spleen within normal limits. Adrenals/Urinary Tract: Adrenal glands are normal. Kidneys are somewhat small and atrophic bilaterally, compatible with history of end-stage renal disease. Innumerable cystic lesions present. The larger of these lesions measure water density common ir most compatible with cysts. The smaller lesions are too small the characterize. No hydroureter. Bladder largely decompressed. Mild circumferential bladder wall thickening most likely related incomplete distension. Stomach/Bowel: Stomach within normal limits. No evidence for bowel obstruction. Appendix normal. Sigmoid diverticulosis without imaging findings to suggest acute diverticulitis. No acute inflammatory changes identified about the bowels.  Vascular/Lymphatic: Moderate aorto bi-iliac atherosclerotic disease. No aneurysm. Shotty subcentimeter retroperitoneal lymph nodes noted. No pathologically enlarged intra-abdominal or pelvic lymph nodes identified. Reproductive: Uterus within normal limits. Ovaries grossly unremarkable. Other: No free intraperitoneal air. No free fluid. Small fat containing paraumbilical hernia noted. Musculoskeletal: Postsurgical changes noted within the lumbar spine. No acute osseous abnormality. No worrisome lytic or blastic osseous lesions. IMPRESSION: 1. No CT evidence for acute intra-abdominal pelvic process. 2. Sigmoid diverticulosis without evidence for acute diverticulitis. 3. Small and atrophic kidneys bilaterally with multiple cysts present, compatible with history of end-stage renal disease. Electronically Signed: By:  Jeannine Boga M.D. On: 06/25/2016 17:38        Scheduled Meds: . amiodarone  200 mg Oral Daily  . carvedilol  50 mg Oral BID WC  . cinacalcet  30 mg Oral Q breakfast  . ciprofloxacin  400 mg Intravenous Q24H  . docusate sodium  200 mg Oral BID  . furosemide  80 mg Oral q morning - 10a  . losartan  100 mg Oral QHS  . metroNIDAZOLE  500 mg Oral Q8H  . senna-docusate  1 tablet Oral BID  . sevelamer carbonate  1,600 mg Oral BID WC  . sodium chloride flush  3 mL Intravenous Q12H  . Warfarin - Pharmacist Dosing Inpatient   Does not apply q1800   Continuous Infusions:   LOS: 0 days    Time spent: 35 min    Jal, DO [facility] Pager [phone]  If 7PM-7AM, please contact night-coverage [email or web address] Password [email or web address] 06/26/2016, 10:07 AM

## 2016-06-26 NOTE — Progress Notes (Signed)
Nutrition Brief Note  Patient identified on the Malnutrition Screening Tool (MST) Report.   Wt Readings from Last 15 Encounters:  06/26/16 243 lb 3.2 oz (110.3 kg)  07/01/12 279 lb 15.8 oz (127 kg)    Body mass index is 40.47 kg/m. Patient meets criteria for class 3, extreme/morbid obesity based on current BMI.   Patient reports recent weight loss, ? Related to fluid status with ESRD. She does not follow a renal diet at home, because, "my protein is low." She is not on a fluid restriction because she still voids. She thinks she has been eating smaller portions and that is why she has lost weight.   Nutrition-Focused physical exam completed. Findings are no fat depletion, no muscle depletion, and no edema.  Patient has maintained her lean body mass, despite reported weight loss. She reports usual weight 265 lbs, now 243 lbs; 8% weight loss within 4-6 months is not significant.  Current diet order is clear liquids. Patient is hungry and ready for "real food." Labs and medications reviewed.   No nutrition interventions warranted at this time. If nutrition issues arise, please consult RD.   Molli Barrows, RD, LDN, [place] Pager [phone] After Hours Pager [phone]

## 2016-06-26 NOTE — Consult Note (Signed)
[facility] Renal Consultation Note    Indication for Consultation:  Management of ESRD/hemodialysis; anemia, hypertension/volume and secondary hyperparathyroidism PCP: Dr. Lannie Fields, [place] [facility]  HPI: Jane [name] is a very pleasant 70 y.o. female with ESRD 2/2 HTN on hemodialysis MWF at [facility]. PMH significant for HTN, PAF on coumadin, spinal stenosis with lumbar radiculopathy 12/2014, L, total knee replacement, colon polyps, anemia of chronic disease, SHPT.   Patient states that she started having intermittent sharp LLQ abdominal pain back in August, 2017 to which she attributed to constipation D/T binder use. She used OTC stool softeners but noticed that pain was becoming more frequent and says the pain intensified during month of November, occurring more frequently for longer periods of time.Marland Kitchen She was seen in HD clinic 06/23/16 by Dr. Lorrene Reid who lowered her EDW D/T unintentional wt loss and told her that she needed to see GI. She went to her primary care doctor 06/23/16 who initially ordered US of abdomin but this could not be performed until 07/10/16. She attended her regular HD session Wednesday and developed sharp pain under R breast radiating to L side which subsided after SL NTG. She was sent to [facility] for evaluation. Upon arrival to ED, vital signs were stable, EKG showed SR with nonspecific t wave changes, troponin was negative. CT of abd/pelvis showed Sigmoid diverticulosis without evidence for acute diverticulitis with few small locules of gas positioned just adjacent to the sigmoid colon, which likely reflect a small micro perforation. She has been admitted per primary for diverticulitis of large intestine with microperforation, started on cipro and flagyl. Surgical consult will be obtained if she does not respond to antibiotics and clear liquid diet.  She has been seen by cardiology who have determined that her symptoms are most likely not cardiac in  orgin as she has not had further chest pain, troponin's are negative and no further work up has been planned. We have been asked to manage her hemodialysis.  [name] is a star hemodialysis patient who is compliant with hemodialysis prescription. EDW has been recently lowered D/T unintentional weight loss which she says she now realizes that she wasn't eating as much because it made her abdominal pain worse. She is compliant with medications, binders. Right now c/o mild abd pain is hoping abx will help   Past Medical History:  Diagnosis Date  . Acid reflux   . Atrial fibrillation ([place])   . Hypertension   . Renal disorder   . Sleep apnea    Past Surgical History:  Procedure Laterality Date  . KNEE ARTHROPLASTY    . TUBAL LIGATION     Family History  Problem Relation Age of Onset  . Heart failure Mother   . Stroke Mother   . Other Father    Social History:  reports that she has never smoked. She has never used smokeless tobacco. She reports that she does not drink alcohol or use drugs. Allergies  Allergen Reactions  . Tape Other (See Comments)    PLASTIC TAPE TEARS OFF THE SKIN AND BRUISES IT TERRIBLY!!   Prior to Admission medications   Medication Sig Start Date End Date Taking? Authorizing Provider  acetaminophen (TYLENOL) 500 MG tablet Take 1,000 mg by mouth 2 (two) times daily as needed (for pain).   Yes Historical Provider, MD  amiodarone (PACERONE) 200 MG tablet Take 200 mg by mouth daily.   Yes Historical Provider, MD  Biotin 10000 MCG TABS Take 1 tablet by mouth  daily.   Yes Historical Provider, MD  carvedilol (COREG) 25 MG tablet Take 50 mg by mouth 2 (two) times daily with a meal.   Yes Historical Provider, MD  cinacalcet (SENSIPAR) 30 MG tablet Take 30 mg by mouth daily.   Yes Historical Provider, MD  docusate sodium (PHILLIPS STOOL SOFTENER) 100 MG capsule Take 200 mg by mouth 2 (two) times daily.   Yes Historical Provider, MD  furosemide (LASIX) 80 MG tablet Take 80  mg by mouth every morning.   Yes Historical Provider, MD  losartan (COZAAR) 100 MG tablet Take 100 mg by mouth at bedtime.  06/05/16  Yes Historical Provider, MD  multivitamin (RENA-VIT) TABS tablet Take 1 tablet by mouth daily.   Yes Historical Provider, MD  polyethylene glycol powder (GLYCOLAX/MIRALAX) powder Take 17 g by mouth every other day. Ramos   Yes Historical Provider, MD  sevelamer carbonate (RENVELA) 800 MG tablet Take 800-1,600 mg by mouth See admin instructions. 1,600 mg after meals TWO times a day and 800 mg after each snack   Yes Historical Provider, MD  warfarin (COUMADIN) 5 MG tablet Take 5-7.5 mg by mouth See admin instructions. 5 mg at bedtime on Sun/Mon/Tues/Thurs/Sat and 7.5 mg on Wed/Fri 06/05/16  Yes Historical Provider, MD  warfarin (COUMADIN) 5 MG tablet Take 1 tablet (5 mg total) by mouth daily. Patient not taking: Reported on 06/25/2016 07/02/12   Jane Poisson, MD   Current Facility-Administered Medications  Medication Dose Route Frequency Provider Last Rate Last Dose  . acetaminophen (TYLENOL) tablet 650 mg  650 mg Oral Q6H PRN Edwin Dada, MD   650 mg at 06/25/16 2211   Or  . acetaminophen (TYLENOL) suppository 650 mg  650 mg Rectal Q6H PRN Edwin Dada, MD      . amiodarone (PACERONE) tablet 200 mg  200 mg Oral Daily Edwin Dada, MD   200 mg at 06/26/16 0850  . carvedilol (COREG) tablet 50 mg  50 mg Oral BID WC Edwin Dada, MD   50 mg at 06/26/16 0850  . cinacalcet (SENSIPAR) tablet 30 mg  30 mg Oral Q breakfast Edwin Dada, MD      . ciprofloxacin (CIPRO) IVPB 400 mg  400 mg Intravenous Q24H Laren Everts, RPH      . docusate sodium (COLACE) capsule 200 mg  200 mg Oral BID Edwin Dada, MD   200 mg at 06/26/16 0849  . furosemide (LASIX) tablet 80 mg  80 mg Oral q morning - 10a Edwin Dada, MD   80 mg at 06/26/16 0850  . HYDROcodone-acetaminophen (NORCO/VICODIN) 5-325 MG per tablet 1-2  tablet  1-2 tablet Oral Q4H PRN Edwin Dada, MD      . losartan (COZAAR) tablet 100 mg  100 mg Oral QHS Edwin Dada, MD   100 mg at 06/25/16 2211  . metroNIDAZOLE (FLAGYL) tablet 500 mg  500 mg Oral Q8H Edwin Dada, MD   500 mg at 06/26/16 1234  . ondansetron (ZOFRAN) tablet 4 mg  4 mg Oral Q6H PRN Edwin Dada, MD       Or  . ondansetron (ZOFRAN) injection 4 mg  4 mg Intravenous Q6H PRN Edwin Dada, MD      . senna-docusate (Senokot-S) tablet 1 tablet  1 tablet Oral BID Geradine Girt, DO   1 tablet at 06/26/16 1234  . sevelamer carbonate (RENVELA) tablet 1,600 mg  1,600 mg Oral BID WC  Edwin Dada, MD      . sevelamer carbonate (RENVELA) tablet 800 mg  800 mg Oral BID PRN Edwin Dada, MD      . sodium chloride flush (NS) 0.9 % injection 3 mL  3 mL Intravenous Q12H Edwin Dada, MD   3 mL at 06/25/16 2200  . warfarin (COUMADIN) tablet 5 mg  5 mg Oral ONCE-1800 Karren Cobble, [facility]      . Warfarin - Pharmacist Dosing Inpatient   Does not apply q1800 Edwin Dada, MD       Labs: Basic Metabolic Panel:  Recent Labs Lab 06/25/16 1604 06/26/16 0321  NA 139 138  K 3.5 3.6  CL 95* 97*  CO2 30 34*  GLUCOSE 93 95  BUN 17 21*  CREATININE 3.48* 4.63*  CALCIUM 8.9 8.6*   Liver Function Tests:  Recent Labs Lab 06/25/16 1604 06/26/16 0321  AST 18 18  ALT 15 12*  ALKPHOS 113 105  BILITOT 0.6 0.8  PROT 7.9 7.0  ALBUMIN 3.6 3.1*   CBC:  Recent Labs Lab 06/25/16 1604 06/26/16 0321  WBC 7.0 5.8  NEUTROABS 3.8  --   HGB 11.2* 10.5*  HCT 35.6* 33.0*  MCV 89.9 90.7  PLT 206 225   Cardiac Enzymes:  Recent Labs Lab 06/25/16 2141 06/26/16 0321 06/26/16 0915  TROPONINI <0.03 <0.03 <0.03   Studies/Results: Dg Chest 2 View  Result Date: 06/25/2016 CLINICAL DATA:  Right-sided chest pain and shortness of breath, initial encounter EXAM: CHEST  2 VIEW COMPARISON:  None. FINDINGS: Cardiac  shadow is at the upper limits of normal in size. Aortic calcifications are noted. Long segment stenting in the region of the right shoulder is noted. The lungs are well aerated bilaterally. No focal infiltrate or sizable effusion is seen. No bony abnormality is noted. IMPRESSION: No acute abnormality seen. Electronically Signed   By: Inez Catalina M.D.   On: 06/25/2016 13:21   Ct Abdomen Pelvis W Contrast  Addendum Date: 06/25/2016   ADDENDUM REPORT: 06/25/2016 20:49 ADDENDUM: Upon further review, there are a few small locules of gas positioned just adjacent to the sigmoid colon, which likely reflect a small micro perforation. This is best appreciated on coronal sequence (series 5, image 58). This is closely approximated to the adjacent uterus and left ovary. Additionally, possible small diverticular abscess present adjacent to the sigmoid measures approximately 2.5 x 1.0 cm (series 2, image 56). Given the relative lack of extensive inflammatory changes, these findings are suspected to have been ongoing/smoldering in nature, which could correlate with patient's waxing and waning symptoms. No definite mass lesion identified. Critical Value/emergent results were called by telephone at the time of interpretation on 06/25/2016 at 8:44 pm to Dr. Norina Buzzard , who verbally acknowledged these results. Electronically Signed   By: Jeannine Boga M.D.   On: 06/25/2016 20:49   Result Date: 06/25/2016 CLINICAL DATA:  Initial evaluation for acute left flank pain. EXAM: CT ABDOMEN AND PELVIS WITH CONTRAST TECHNIQUE: Multidetector CT imaging of the abdomen and pelvis was performed using the standard protocol following bolus administration of intravenous contrast. CONTRAST:  158mL ISOVUE-300 IOPAMIDOL (ISOVUE-300) INJECTION 61% COMPARISON:  None available. FINDINGS: Lower chest: Visualized lung bases are clear. Mild cardiomegaly partially visualized. Hepatobiliary: Liver demonstrates a normal contrast enhanced  appearance. Gallbladder normal. No biliary dilatation. Pancreas: Pancreas within normal limits. Spleen: Spleen within normal limits. Adrenals/Urinary Tract: Adrenal glands are normal. Kidneys are somewhat small and atrophic bilaterally, compatible with history  of end-stage renal disease. Innumerable cystic lesions present. The larger of these lesions measure water density common ir most compatible with cysts. The smaller lesions are too small the characterize. No hydroureter. Bladder largely decompressed. Mild circumferential bladder wall thickening most likely related incomplete distension. Stomach/Bowel: Stomach within normal limits. No evidence for bowel obstruction. Appendix normal. Sigmoid diverticulosis without imaging findings to suggest acute diverticulitis. No acute inflammatory changes identified about the bowels. Vascular/Lymphatic: Moderate aorto bi-iliac atherosclerotic disease. No aneurysm. Shotty subcentimeter retroperitoneal lymph nodes noted. No pathologically enlarged intra-abdominal or pelvic lymph nodes identified. Reproductive: Uterus within normal limits. Ovaries grossly unremarkable. Other: No free intraperitoneal air. No free fluid. Small fat containing paraumbilical hernia noted. Musculoskeletal: Postsurgical changes noted within the lumbar spine. No acute osseous abnormality. No worrisome lytic or blastic osseous lesions. IMPRESSION: 1. No CT evidence for acute intra-abdominal pelvic process. 2. Sigmoid diverticulosis without evidence for acute diverticulitis. 3. Small and atrophic kidneys bilaterally with multiple cysts present, compatible with history of end-stage renal disease. Electronically Signed: By: Jeannine Boga M.D. On: 06/25/2016 17:38    ROS: As per HPI otherwise negative.   Physical Exam: Vitals:   06/25/16 2030 06/25/16 2112 06/25/16 2117 06/26/16 0526  BP: 143/63  (!) 171/52 (!) 129/40  Pulse: 64   64  Resp: 12   12  Temp:    98.4 F (36.9 C)  TempSrc:     Oral  SpO2: 95%   98%  Weight:  109.9 kg (242 lb 4.8 oz)  110.3 kg (243 lb 3.2 oz)  Height:  5\' 5"  (1.651 m)       General: Very pleasant, well nourished, in no acute distress. Head: Normocephalic, atraumatic, sclera non-icteric, mucus membranes are moist Neck: Supple. JVD not elevated. Lungs: Clear bilaterally to auscultation without wheezes, rales, or rhonchi. Breathing is unlabored. Heart: RRR with S1 S2. 1/6 systolic M. Abdomen: Soft, non-tender, non-distended with normoactive bowel sounds. No rebound/guarding. No obvious abdominal masses. M-S:  Strength and tone appear normal for age. Lower extremities: Trace BLE edema, no open wounds.  Neuro: Alert and oriented X 3. Moves all extremities spontaneously. Psych:  Responds to questions appropriately with a normal affect. Dialysis Access: RFA AVF + bruit-very short section is used for cannulation. Uses button holes.   Dialysis Orders: [facility] MWF 3 hours 45 minutes 450/800 111.5 kg  2.0 K/2.0 Ca  Heparin: 2000 units IV q tx Hectoral 3 mcg IV q tx (Last PTH 394 06/10/16) Mircera 50 mcg IV q 2 weeks (last dose 06/25/16 Last HGB 11.1 06/18/16)  BMD meds:  Renvela 800 mg 2 tabs PO TID with meals Sensipar 30 mg PO q day Last BMD labs: Ca 8.5 C Ca 8.8 Phos 4.8 PTH 394 06/10/16)  Assessment/Plan: 1.  Diverticulitis of large intestine w/micro perforation: Per primary. Conservative treatment with IV cipro/PO flagyl. Surgical consult if no improvement. Per primary.  2.  Atypical Chest pain: Troponin neg EKG with acute changes, Seen by cardiology. No further chest pain since admission, no further work up ordered at present.  3.  ESRD -  MWF via RFA AVF. K+ 3.6. Use 4.0 K bath. Hold heparin. Routine HD tomorrow 4.  Hypertension/volume  -Carvedilol 50 mg PO BID- Losartan 100 mg PO daily, furosemide 80 mg PO daily. OP EDW.recently lowered. Current wt is 110.3 which is under OP EDW. Challenge and lower EDW 0.5 kg tomorrow.  5.  Anemia  - HGB 10.5.  Recent dose ESA 06/25/16. Follow HGB.  6.  Metabolic  bone disease -  Cont binders, VDRA, and sensipar.  7.  Nutrition - clear liquids per primary. Watch volume and BS.  8. H/O Afib: cont coumadin. INR 2.38    Rita H. Owens Shark, NP-C 06/26/2016, 2:22 PM  [employer] [phone]  Patient seen and examined, agree with above note with above modifications. Known to [place]- compliant HD pt who has had several months of abdominal pain- found to have diverticulitis.  On abx and bowel rest.  Routine HD tomorrow  Corliss Parish, MD 06/27/2016

## 2016-06-26 NOTE — Progress Notes (Signed)
ANTICOAGULATION CONSULT NOTE - Follow Up Consult  Pharmacy Consult for Coumadin Indication: atrial fibrillation  Allergies  Allergen Reactions  . Tape Other (See Comments)    PLASTIC TAPE TEARS OFF THE SKIN AND BRUISES IT TERRIBLY!!    Patient Measurements: Height: 5\' 5"  (165.1 cm) Weight: 243 lb 3.2 oz (110.3 kg) IBW/kg (Calculated) : 57  Vital Signs: Temp: 98.4 F (36.9 C) (12/07 0526) Temp Source: Oral (12/07 0526) BP: 129/40 (12/07 0526) Pulse Rate: 64 (12/07 0526)  Labs:  Recent Labs  06/25/16 1604 06/25/16 2141 06/26/16 0321 06/26/16 0915 06/26/16 1129  HGB 11.2*  --  10.5*  --   --   HCT 35.6*  --  33.0*  --   --   PLT 206  --  225  --   --   LABPROT 26.2*  --   --   --  26.5*  INR 2.36  --   --   --  2.38  CREATININE 3.48*  --  4.63*  --   --   TROPONINI  --  <0.03 <0.03 <0.03  --     Estimated Creatinine Clearance: 13.6 mL/min (by C-G formula based on SCr of 4.63 mg/dL (H)).  Assessment:  Anticoag: On coumadin PTA for h/o Afib.  Hgb 11.2>10.5. Severe drug/lab interaction with Flagyl.INR 2.38 today - PTA dose coumadin 5 mg MTThSS, 7.5mg  W/Fri, INR 2.36  Goal of Therapy:  INR 2-3 Monitor platelets by anticoagulation protocol: Yes   Plan:  Coumadin 5mg  po x 1 tonight Daily PT/INR  Eleana Tocco S. Alford Highland, PharmD, BCPS Clinical Staff Pharmacist Pager [phone]  Eilene Ghazi Stillinger 06/26/2016,12:41 PM

## 2016-06-26 NOTE — Progress Notes (Signed)
Pharmacy Antibiotic Note  Jane [name] is a 70 y.o. female admitted on 06/25/2016 with diverticulitis.  Pharmacy has been consulted to change Cipro to IV from PO.  Plan: Cipro 400mg  IV Q24H. Monitor INR as Cipro may affect it.  Height: 5\' 5"  (165.1 cm) Weight: 242 lb 4.8 oz (109.9 kg) IBW/kg (Calculated) : 57  Temp (24hrs), Avg:98.7 F (37.1 C), Min:98.7 F (37.1 C), Max:98.7 F (37.1 C)   Recent Labs Lab 06/25/16 1604  WBC 7.0  CREATININE 3.48*    Estimated Creatinine Clearance: 18 mL/min (by C-G formula based on SCr of 3.48 mg/dL (H)).    Allergies  Allergen Reactions  . Tape Other (See Comments)    PLASTIC TAPE TEARS OFF THE SKIN AND BRUISES IT TERRIBLY!!    Thank you for allowing pharmacy to be a part of this patient's care.  Wynona Neat, PharmD, BCPS  06/26/2016 12:32 AM

## 2016-06-26 NOTE — Discharge Instructions (Addendum)
[facility], [place] [phone]  OPEN ABDOMINAL SURGERY: POST OP INSTRUCTIONS  Keep the wound on your abdomen clean and dry.  Wash with soap and water 2-3 times per day and keep a dressing over the moist area under your panus between washings.  Call if there is an issue with the wound healing.   Always review your discharge instruction sheet given to you by the facility where your surgery was performed.  IF YOU HAVE DISABILITY OR FAMILY LEAVE FORMS, YOU MUST BRING THEM TO THE OFFICE FOR PROCESSING.  PLEASE DO NOT GIVE THEM TO YOUR DOCTOR.  1. A prescription for pain medication may be given to you upon discharge.  Take your pain medication as prescribed, if needed.  If narcotic pain medicine is not needed, then you may take acetaminophen (Tylenol) or ibuprofen (Advil) as needed. 2. Take your usually prescribed medications unless otherwise directed. 3. If you need a refill on your pain medication, please contact your pharmacy. They will contact our office to request authorization.  Prescriptions will not be filled after 5pm or on week-ends. 4. You should follow a light diet the first few days after arrival home, such as soup and crackers, pudding, etc.unless your doctor has advised otherwise. A high-fiber, low fat diet can be resumed as tolerated.   Be sure to include lots of fluids daily. Most patients will experience some swelling and bruising on the chest and neck area.  Ice packs will help.  Swelling and bruising can take several days to resolve 5. Most patients will experience some swelling and bruising in the area of the incision. Ice pack will help. Swelling and bruising can take several days to resolve..  6. It is common to experience some constipation if taking pain medication after surgery.  Increasing fluid intake and taking a stool softener will usually help or prevent this problem from occurring.  A mild laxative (Milk of Magnesia or Miralax) should be taken according  to package directions if there are no bowel movements after 48 hours. 7.  You may have steri-strips (small skin tapes) in place directly over the incision.  These strips should be left on the skin for 7-10 days.  If your surgeon used skin glue on the incision, you may shower in 24 hours.  The glue will flake off over the next 2-3 weeks.  Any sutures or staples will be removed at the office during your follow-up visit. You may find that a light gauze bandage over your incision may keep your staples from being rubbed or pulled. You may shower and replace the bandage daily. 8. ACTIVITIES:  You may resume regular (light) daily activities beginning the next day--such as daily self-care, walking, climbing stairs--gradually increasing activities as tolerated.  You may have sexual intercourse when it is comfortable.  Refrain from any heavy lifting or straining until approved by your doctor. a. You may drive when you no longer are taking prescription pain medication, you can comfortably wear a seatbelt, and you can safely maneuver your car and apply brakes b. Return to Work: ___________________________________ 8. You should see your doctor in the office for a follow-up appointment approximately two weeks after your surgery.  Make sure that you call for this appointment within a day or two after you arrive home to insure a convenient appointment time. OTHER INSTRUCTIONS:  _____________________________________________________________ _____________________________________________________________  WHEN TO CALL YOUR DOCTOR: 1. Fever over 101.0 2. Inability to urinate 3. Nausea and/or vomiting 4. Extreme  swelling or bruising 5. Continued bleeding from incision. 6. Increased pain, redness, or drainage from the incision. 7. Difficulty swallowing or breathing 8. Muscle cramping or spasms. 9. Numbness or tingling in hands or feet or around lips.  The clinic staff is available to answer your questions during  regular business hours.  Please dont hesitate to call and ask to speak to one of the nurses if you have concerns.  For further questions, please visit [email or web address]    Information on my medicine - Coumadin   (Warfarin)  This medication education was reviewed with me or my healthcare representative as part of my discharge preparation.  The pharmacist that spoke with me during my hospital stay was:  Wayland Salinas, [facility]  Why was Coumadin prescribed for you? Coumadin was prescribed for you because you have a blood clot or a medical condition that can cause an increased risk of forming blood clots. Blood clots can cause serious health problems by blocking the flow of blood to the heart, lung, or brain. Coumadin can prevent harmful blood clots from forming. As a reminder your indication for Coumadin is:   Stroke Prevention Because Of Atrial Fibrillation  What test will check on my response to Coumadin? While on Coumadin (warfarin) you will need to have an INR test regularly to ensure that your dose is keeping you in the desired range. The INR (international normalized ratio) number is calculated from the result of the laboratory test called prothrombin time (PT).  If an INR APPOINTMENT HAS NOT ALREADY BEEN MADE FOR YOU please schedule an appointment to have this lab work done by your health care provider within 7 days. Your INR goal is usually a number between:  2 to 3 or your provider may give you a more narrow range like 2-2.5.  Ask your health care provider during an office visit what your goal INR is.  What  do you need to  know  About  COUMADIN? Take Coumadin (warfarin) exactly as prescribed by your healthcare provider about the same time each day.  DO NOT stop taking without talking to the doctor who prescribed the medication.  Stopping without other blood clot prevention medication to take the place of Coumadin may increase your risk of developing a new clot or  stroke.  Get refills before you run out.  What do you do if you miss a dose? If you miss a dose, take it as soon as you remember on the same day then continue your regularly scheduled regimen the next day.  Do not take two doses of Coumadin at the same time.  Important Safety Information A possible side effect of Coumadin (Warfarin) is an increased risk of bleeding. You should call your healthcare provider right away if you experience any of the following: ? Bleeding from an injury or your nose that does not stop. ? Unusual colored urine (red or dark brown) or unusual colored stools (red or black). ? Unusual bruising for unknown reasons. ? A serious fall or if you hit your head (even if there is no bleeding).  Some foods or medicines interact with Coumadin (warfarin) and might alter your response to warfarin. To help avoid this: ? Eat a balanced diet, maintaining a consistent amount of Vitamin K. ? Notify your provider about major diet changes you plan to make. ? Avoid alcohol or limit your intake to 1 drink for women and 2 drinks for men per day. (1 drink is 5 oz. wine, 12 oz. beer,  or 1.5 oz. liquor.)  Make sure that ANY health care provider who prescribes medication for you knows that you are taking Coumadin (warfarin).  Also make sure the healthcare provider who is monitoring your Coumadin knows when you have started a new medication including herbals and non-prescription products.  Coumadin (Warfarin)  Major Drug Interactions  Increased Warfarin Effect Decreased Warfarin Effect  Alcohol (large quantities) Antibiotics (esp. Septra/Bactrim, Flagyl, Cipro) Amiodarone (Cordarone) Aspirin (ASA) Cimetidine (Tagamet) Megestrol (Megace) NSAIDs (ibuprofen, naproxen, etc.) Piroxicam (Feldene) Propafenone (Rythmol SR) Propranolol (Inderal) Isoniazid (INH) Posaconazole (Noxafil) Barbiturates (Phenobarbital) Carbamazepine (Tegretol) Chlordiazepoxide (Librium) Cholestyramine  (Questran) Griseofulvin Oral Contraceptives Rifampin Sucralfate (Carafate) Vitamin K   Coumadin (Warfarin) Major Herbal Interactions  Increased Warfarin Effect Decreased Warfarin Effect  Garlic Ginseng Ginkgo biloba Coenzyme Q10 Green tea St. Johns wort    Coumadin (Warfarin) FOOD Interactions  Eat a consistent number of servings per week of foods HIGH in Vitamin K (1 serving =  cup)  Collards (cooked, or boiled & drained) Kale (cooked, or boiled & drained) Mustard greens (cooked, or boiled & drained) Parsley *serving size only =  cup Spinach (cooked, or boiled & drained) Swiss chard (cooked, or boiled & drained) Turnip greens (cooked, or boiled & drained)  Eat a consistent number of servings per week of foods MEDIUM-HIGH in Vitamin K (1 serving = 1 cup)  Asparagus (cooked, or boiled & drained) Broccoli (cooked, boiled & drained, or raw & chopped) Brussel sprouts (cooked, or boiled & drained) *serving size only =  cup Lettuce, raw (green leaf, endive, romaine) Spinach, raw Turnip greens, raw & chopped   These websites have more information on Coumadin (warfarin):  FailFactory.se; VeganReport.com.au;

## 2016-06-26 NOTE — Consult Note (Signed)
[name] MRN: [id number], DOB/AGE: May 22, 1946   Admit date: 06/25/2016   Reason for Consult: Chest Pain Requesting MD: Dr. Eliseo Squires, Internal Medicine    Primary Physician: [place] Primary Cardiologist: Dr. Quentin Cornwall [facility])  Pt. Profile:  70 y/o female with a h/o ESRD on HD, afib on Coumadin, HTN and anemia, admitted for atypical chest pain and abdominal pain.   Problem List  Past Medical History:  Diagnosis Date  . Acid reflux   . Atrial fibrillation (National)   . Hypertension   . Renal disorder   . Sleep apnea     Past Surgical History:  Procedure Laterality Date  . KNEE ARTHROPLASTY    . TUBAL LIGATION       Allergies  Allergies  Allergen Reactions  . Tape Other (See Comments)    PLASTIC TAPE TEARS OFF THE SKIN AND BRUISES IT TERRIBLY!!    HPI   70 y/o female with a h/o ESRD on HD, afib on Coumadin, HTN and anemia, admitted for atypical chest pain and abdominal pain. She is on HD MWF. She is followed by Dr. Quentin Cornwall, cardiologist, in [facility]. She denies any known h/o CAD. She has had multiple stress test in the past. The most recent was in 2016 as part of pre-operative assessment prior to back surgery. She was told it was normal. She has never required LHC.   She has been having ongoing abdominal pain off and on for the last several weeks. Her chest pain started just yesterday during HD. She felt like they were pulling off too much fluid. She then developed sudden onset right sided chest pain, underneath her right breast. It was sharp in nature and radiated to her left side. The pain was constant and lasted ~45 min. She told her RN who then gave her O2 and SL NTG. The pain subsided and went away. She denies any further recurrence. She denies any symptoms of exertional CP or dyspnea. Given her symptoms, she was advised to go to the ED. She has been admitted by IM. Cardiac enzymes are negative x 3. EKG shows NSR. No ischemia. CXR  unremarkable. CT of abdomen show ? Small micro perforation, adjacent to the sigmoid colon and possible small diverticular abscess adjacent to the sigmoid. No definite mass lesion identified. WBC is normal. Hgb 10.5. SCr 4.63. K is WNL at 3.6. She has been started on antibiotics, cipro/ flagyl for diverticulitis.    Home Medications  Prior to Admission medications   Medication Sig Start Date End Date Taking? Authorizing Provider  acetaminophen (TYLENOL) 500 MG tablet Take 1,000 mg by mouth 2 (two) times daily as needed (for pain).   Yes Historical Provider, MD  amiodarone (PACERONE) 200 MG tablet Take 200 mg by mouth daily.   Yes Historical Provider, MD  Biotin 10000 MCG TABS Take 1 tablet by mouth daily.   Yes Historical Provider, MD  carvedilol (COREG) 25 MG tablet Take 50 mg by mouth 2 (two) times daily with a meal.   Yes Historical Provider, MD  cinacalcet (SENSIPAR) 30 MG tablet Take 30 mg by mouth daily.   Yes Historical Provider, MD  docusate sodium (PHILLIPS STOOL SOFTENER) 100 MG capsule Take 200 mg by mouth 2 (two) times daily.   Yes Historical Provider, MD  furosemide (LASIX) 80 MG tablet Take 80 mg by mouth every morning.   Yes Historical Provider, MD  losartan (COZAAR) 100 MG tablet Take 100 mg by mouth at bedtime.  06/05/16  Yes Historical Provider, MD  multivitamin (RENA-VIT) TABS tablet Take 1 tablet by mouth daily.   Yes Historical Provider, MD  polyethylene glycol powder (GLYCOLAX/MIRALAX) powder Take 17 g by mouth every other day. [place]   Yes Historical Provider, MD  sevelamer carbonate (RENVELA) 800 MG tablet Take 800-1,600 mg by mouth See admin instructions. 1,600 mg after meals TWO times a day and 800 mg after each snack   Yes Historical Provider, MD  warfarin (COUMADIN) 5 MG tablet Take 5-7.5 mg by mouth See admin instructions. 5 mg at bedtime on Sun/Mon/Tues/Thurs/Sat and 7.5 mg on Wed/Fri 06/05/16  Yes Historical Provider, MD  warfarin (COUMADIN) 5 MG tablet Take 1  tablet (5 mg total) by mouth daily. Patient not taking: Reported on 06/25/2016 07/02/12   Hosie Poisson, MD    Hospital Meds  . amiodarone  200 mg Oral Daily  . carvedilol  50 mg Oral BID WC  . cinacalcet  30 mg Oral Q breakfast  . ciprofloxacin  400 mg Intravenous Q24H  . docusate sodium  200 mg Oral BID  . furosemide  80 mg Oral q morning - 10a  . losartan  100 mg Oral QHS  . metroNIDAZOLE  500 mg Oral Q8H  . sevelamer carbonate  1,600 mg Oral BID WC  . sodium chloride flush  3 mL Intravenous Q12H  . Warfarin - Pharmacist Dosing Inpatient   Does not apply q1800    Family History  Family History  Problem Relation Age of Onset  . Heart failure Mother   . Stroke Mother   . Other Father     Social History  Social History   Social History  . Marital status: Single    Spouse name: N/A  . Number of children: N/A  . Years of education: N/A   Occupational History  . Not on file.   Social History Main Topics  . Smoking status: Never Smoker  . Smokeless tobacco: Never Used  . Alcohol use No  . Drug use: No  . Sexual activity: No   Other Topics Concern  . Not on file   Social History Narrative  . No narrative on file     Review of Systems General:  No chills, fever, night sweats or weight changes.  Cardiovascular:  No chest pain, dyspnea on exertion, edema, orthopnea, palpitations, paroxysmal nocturnal dyspnea. Dermatological: No rash, lesions/masses Respiratory: No cough, dyspnea Urologic: No hematuria, dysuria Abdominal:   No nausea, vomiting, diarrhea, bright red blood per rectum, melena, or hematemesis Neurologic:  No visual changes, wkns, changes in mental status. All other systems reviewed and are otherwise negative except as noted above.  Physical Exam  Blood pressure (!) 129/40, pulse 64, temperature 98.4 F (36.9 C), temperature source Oral, resp. rate 12, height 5\' 5"  (1.651 m), weight 243 lb 3.2 oz (110.3 kg), SpO2 98 %.  General: Pleasant, NAD, obese   Psych: Normal affect. Neuro: Alert and oriented X 3. Moves all extremities spontaneously. HEENT: Normal  Neck: Supple without bruits or JVD. Lungs:  Resp regular and unlabored, CTA. Heart: RRR no s3, s4, 1/6 murmur, loudest at LUSB. Abdomen: Soft, non-tender, non-distended, BS + x 4.  Extremities: No clubbing, cyanosis or edema. DP/PT/Radials 2+ and equal bilaterally.  Labs  Troponin [facility] Test)  Recent Labs  06/25/16 1614  TROPIPOC 0.00    Recent Labs  06/25/16 2141 06/26/16 0321  TROPONINI <0.03 <0.03   Lab Results  Component Value Date  WBC 5.8 06/26/2016   HGB 10.5 (L) 06/26/2016   HCT 33.0 (L) 06/26/2016   MCV 90.7 06/26/2016   PLT 225 06/26/2016    Recent Labs Lab 06/26/16 0321  NA 138  K 3.6  CL 97*  CO2 34*  BUN 21*  CREATININE 4.63*  CALCIUM 8.6*  PROT 7.0  BILITOT 0.8  ALKPHOS 105  ALT 12*  AST 18  GLUCOSE 95   No results found for: CHOL, HDL, LDLCALC, TRIG No results found for: DDIMER   Radiology/Studies  Dg Chest 2 View  Result Date: 06/25/2016 CLINICAL DATA:  Right-sided chest pain and shortness of breath, initial encounter EXAM: CHEST  2 VIEW COMPARISON:  None. FINDINGS: Cardiac shadow is at the upper limits of normal in size. Aortic calcifications are noted. Long segment stenting in the region of the right shoulder is noted. The lungs are well aerated bilaterally. No focal infiltrate or sizable effusion is seen. No bony abnormality is noted. IMPRESSION: No acute abnormality seen. Electronically Signed   By: Inez Catalina M.D.   On: 06/25/2016 13:21   Ct Abdomen Pelvis W Contrast  Addendum Date: 06/25/2016   ADDENDUM REPORT: 06/25/2016 20:49 ADDENDUM: Upon further review, there are a few small locules of gas positioned just adjacent to the sigmoid colon, which likely reflect a small micro perforation. This is best appreciated on coronal sequence (series 5, image 58). This is closely approximated to the adjacent uterus and left  ovary. Additionally, possible small diverticular abscess present adjacent to the sigmoid measures approximately 2.5 x 1.0 cm (series 2, image 56). Given the relative lack of extensive inflammatory changes, these findings are suspected to have been ongoing/smoldering in nature, which could correlate with patient's waxing and waning symptoms. No definite mass lesion identified. Critical Value/emergent results were called by telephone at the time of interpretation on 06/25/2016 at 8:44 pm to Dr. Norina Buzzard , who verbally acknowledged these results. Electronically Signed   By: Jeannine Boga M.D.   On: 06/25/2016 20:49   Result Date: 06/25/2016 CLINICAL DATA:  Initial evaluation for acute left flank pain. EXAM: CT ABDOMEN AND PELVIS WITH CONTRAST TECHNIQUE: Multidetector CT imaging of the abdomen and pelvis was performed using the standard protocol following bolus administration of intravenous contrast. CONTRAST:  177mL ISOVUE-300 IOPAMIDOL (ISOVUE-300) INJECTION 61% COMPARISON:  None available. FINDINGS: Lower chest: Visualized lung bases are clear. Mild cardiomegaly partially visualized. Hepatobiliary: Liver demonstrates a normal contrast enhanced appearance. Gallbladder normal. No biliary dilatation. Pancreas: Pancreas within normal limits. Spleen: Spleen within normal limits. Adrenals/Urinary Tract: Adrenal glands are normal. Kidneys are somewhat small and atrophic bilaterally, compatible with history of end-stage renal disease. Innumerable cystic lesions present. The larger of these lesions measure water density common ir most compatible with cysts. The smaller lesions are too small the characterize. No hydroureter. Bladder largely decompressed. Mild circumferential bladder wall thickening most likely related incomplete distension. Stomach/Bowel: Stomach within normal limits. No evidence for bowel obstruction. Appendix normal. Sigmoid diverticulosis without imaging findings to suggest acute  diverticulitis. No acute inflammatory changes identified about the bowels. Vascular/Lymphatic: Moderate aorto bi-iliac atherosclerotic disease. No aneurysm. Shotty subcentimeter retroperitoneal lymph nodes noted. No pathologically enlarged intra-abdominal or pelvic lymph nodes identified. Reproductive: Uterus within normal limits. Ovaries grossly unremarkable. Other: No free intraperitoneal air. No free fluid. Small fat containing paraumbilical hernia noted. Musculoskeletal: Postsurgical changes noted within the lumbar spine. No acute osseous abnormality. No worrisome lytic or blastic osseous lesions. IMPRESSION: 1. No CT evidence for acute intra-abdominal pelvic process. 2.  Sigmoid diverticulosis without evidence for acute diverticulitis. 3. Small and atrophic kidneys bilaterally with multiple cysts present, compatible with history of end-stage renal disease. Electronically Signed: By: Jeannine Boga M.D. On: 06/25/2016 17:38    ECG  NSR. 66 bpm. No ischemia.     ASSESSMENT AND PLAN  Principal Problem:   Diverticulitis of large intestine with perforation Active Problems:   ESRD on hemodialysis (HCC)   Paroxysmal atrial fibrillation (HCC)   Essential hypertension   Anemia in other chronic diseases classified elsewhere   Chest pain   1. Atypical Chest Pain: characteristics are not c/w cardiac etiology. Sharp pain underneath right breast that occurred during HD. 45 min of constant CP w/o enzyme leak. Cardiac enzymes are negative x 3. EKG shows NSR w/o ischemia. She has had no recurrent CP and denies symptoms of exertional CP or dyspnea. Stress test in [place] in 2016 was normal. Recommend continued management of her diverticulitis. No plans for further inpatient cardiac w/u at this time. She can f/u with her primary cardiologist in [facility] after her hospitalization.   2. PAF: NSR on telemetry. HR is stable on Coreg and amiodarone. She is on chronic Coumadin with therapeutic  INR. Followed by Cardiology in [facility].   3. Diverticulitis: on cipro/ flagyl. Management per IM.   4. ESRD: HD MWF  5. HTN: BP is controlled    Signed, Lyda Jester, PA-C 06/26/2016, 8:01 AM

## 2016-06-27 LAB — CBC
HCT: 32.6 % — ABNORMAL LOW (ref 36.0–46.0)
Hemoglobin: 10.3 g/dL — ABNORMAL LOW (ref 12.0–15.0)
MCH: 28.3 pg (ref 26.0–34.0)
MCHC: 31.6 g/dL (ref 30.0–36.0)
MCV: 89.6 fL (ref 78.0–100.0)
Platelets: 242 10*3/uL (ref 150–400)
RBC: 3.64 MIL/uL — ABNORMAL LOW (ref 3.87–5.11)
RDW: 16.2 % — ABNORMAL HIGH (ref 11.5–15.5)
WBC: 5.8 10*3/uL (ref 4.0–10.5)

## 2016-06-27 LAB — RENAL FUNCTION PANEL
Albumin: 3.2 g/dL — ABNORMAL LOW (ref 3.5–5.0)
Anion gap: 11 (ref 5–15)
BUN: 32 mg/dL — ABNORMAL HIGH (ref 6–20)
CO2: 30 mmol/L (ref 22–32)
Calcium: 8.6 mg/dL — ABNORMAL LOW (ref 8.9–10.3)
Chloride: 93 mmol/L — ABNORMAL LOW (ref 101–111)
Creatinine, Ser: 6.2 mg/dL — ABNORMAL HIGH (ref 0.44–1.00)
GFR calc Af Amer: 7 mL/min — ABNORMAL LOW (ref >60)
GFR calc non Af Amer: 6 mL/min — ABNORMAL LOW (ref >60)
Glucose, Bld: 92 mg/dL (ref 65–99)
Phosphorus: 4.8 mg/dL — ABNORMAL HIGH (ref 2.5–4.6)
Potassium: 3.3 mmol/L — ABNORMAL LOW (ref 3.5–5.1)
Sodium: 134 mmol/L — ABNORMAL LOW (ref 135–145)

## 2016-06-27 LAB — PROTIME-INR
INR: 2.69
Prothrombin Time: 29.1 seconds — ABNORMAL HIGH (ref 11.4–15.2)

## 2016-06-27 LAB — LIPID PANEL
CHOL/HDL RATIO: 2.7 ratio
Cholesterol: 130 mg/dL (ref 0–200)
HDL: 49 mg/dL (ref >40)
LDL CALC: 73 mg/dL (ref 0–99)
Triglycerides: 42 mg/dL (ref <150)
VLDL: 8 mg/dL (ref 0–40)

## 2016-06-27 MED ORDER — DARBEPOETIN ALFA 60 MCG/0.3ML IJ SOSY
60.0000 ug | PREFILLED_SYRINGE | INTRAMUSCULAR | Status: DC
  Administered 2016-07-01: 60 ug via INTRAVENOUS
  Filled 2016-06-27 (×2): qty 0.3

## 2016-06-27 MED ORDER — WARFARIN SODIUM 2.5 MG PO TABS
2.5000 mg | ORAL_TABLET | Freq: Once | ORAL | Status: AC
  Administered 2016-06-27: 2.5 mg via ORAL
  Filled 2016-06-27 (×2): qty 1

## 2016-06-27 NOTE — Progress Notes (Signed)
ANTICOAGULATION CONSULT NOTE - Follow Up Consult  Pharmacy Consult for Coumadin Indication: atrial fibrillation  Allergies  Allergen Reactions  . Tape Other (See Comments)    PLASTIC TAPE TEARS OFF THE SKIN AND BRUISES IT TERRIBLY!!    Patient Measurements: Height: 5\' 5"  (165.1 cm) Weight: 244 lb 0.8 oz (110.7 kg) (stood to scale ) IBW/kg (Calculated) : 57  Vital Signs: Temp: 98.3 F (36.8 C) (12/08 0644) Temp Source: Oral (12/08 0644) BP: 97/42 (12/08 0930) Pulse Rate: 61 (12/08 0930)  Labs:  Recent Labs  06/25/16 1604 06/25/16 2141 06/26/16 0321 06/26/16 0915 06/26/16 1129 06/26/16 1618 06/27/16 0433 06/27/16 0444  HGB 11.2*  --  10.5*  --   --  11.1*  --  10.3*  HCT 35.6*  --  33.0*  --   --  34.1*  --  32.6*  PLT 206  --  225  --   --  247  --  242  LABPROT 26.2*  --   --   --  26.5*  --  29.1*  --   INR 2.36  --   --   --  2.38  --  2.69  --   CREATININE 3.48*  --  4.63*  --   --  5.19* 6.20*  --   TROPONINI  --  <0.03 <0.03 <0.03  --   --   --   --     Estimated Creatinine Clearance: 10.2 mL/min (by C-G formula based on SCr of 6.2 mg/dL (H)).  Assessment:  Anticoag: On coumadin PTA for h/o Afib.  Hgb 11.2>10.5. Severe drug/lab interaction with Flagyl. INR 2.69 up today. Hgb 11.1>10.3 today - PTA dose coumadin 5 mg MTThSS, 7.5mg  W/Fri, INR 2.36  Goal of Therapy:  INR 2-3 Monitor platelets by anticoagulation protocol: Yes   Plan:  Coumadin 2.5mg  po x 1 tonight Daily PT/INR  Jane [name], PharmD, BCPS Clinical Staff Pharmacist Pager [phone]  Jane [name] 06/27/2016,9:47 AM

## 2016-06-27 NOTE — Consult Note (Signed)
[facility] Consult/Admission Note  Jane [name] 1945-12-16  [id number].    Requesting MD: Dr. Darrick Meigs Chief Complaint/Reason for Consult: Diverticulitis   HPI:   Patient is a 70 -year-old female with a past medical history of ESRD on HD MWF, A. fib on warfarin, HTN, and anemia who presents to the emergency department 2 days ago with complaints of chest pain and abdominal pain. Patient states her abdominal pain began near the end of August this year and has progressively worsened. Initially the pain was intermittent and mild. Now the pain is constant aching at rest, nonradiating, worse and sharp with movement, bearing down, or eating. No specific foods make it worse. Patient has tried MiraLAX and stool softener without relief. Patient has a history of constipation and hemorrhoids. She has seen her PCP about this who ordered ultrasound with concern for mesenteric ischemia. The ultrasound was not performed. Patient's last colonoscopy was on 10/18/15 which did not reveal any diverticulosis but did reveal polyps according to the patient. Patient has had associated nausea, weight loss, decreased appetite. Patient denies blood in her stool although she states chronicly she has blood on the toilet tissue. Patient denies other associated symptoms.  Patient states her pain was 10/10 upon arrival to the ED. She now states her pain is 6/10. She is not taking any pain medication for this. Patient states she now has diarrhea. It is nonbloody.  Ct abd/pelvis 12/06: few small locules of gas positioned just adjacent to the sigmoid colon, which likely reflect a small micro perforation. This is closely approximated to the adjacent uterus and left ovary. Additionally, possible small diverticular abscess present adjacent to the sigmoid measures approximately 2.5 x 1.0 cm. Given the relative lack of extensive inflammatory changes, these findings are suspected to have been ongoing/smoldering in nature, which could  correlate with patient's waxing and waning symptoms. No definite mass lesion identified.  ROS:  Review of Systems  Constitutional: Positive for weight loss. Negative for chills, diaphoresis and fever.  HENT: Negative for sore throat.   Respiratory: Negative for cough and shortness of breath.   Gastrointestinal: Positive for abdominal pain, constipation, diarrhea and nausea. Negative for blood in stool, melena and vomiting.  Genitourinary: Negative for dysuria.  Musculoskeletal: Negative for myalgias.  Neurological: Negative for loss of consciousness.  All other systems reviewed and are negative.    Family History  Problem Relation Age of Onset  . Heart failure Mother   . Stroke Mother   . Other Father     Past Medical History:  Diagnosis Date  . Acid reflux   . Atrial fibrillation ([place])   . Hypertension   . Renal disorder   . Sleep apnea     Past Surgical History:  Procedure Laterality Date  . KNEE ARTHROPLASTY    . TUBAL LIGATION      Social History:  reports that she has never smoked. She has never used smokeless tobacco. She reports that she does not drink alcohol or use drugs.  Allergies:  Allergies  Allergen Reactions  . Tape Other (See Comments)    PLASTIC TAPE TEARS OFF THE SKIN AND BRUISES IT TERRIBLY!!    Medications Prior to Admission  Medication Sig Dispense Refill  . acetaminophen (TYLENOL) 500 MG tablet Take 1,000 mg by mouth 2 (two) times daily as needed (for pain).    Marland Kitchen amiodarone (PACERONE) 200 MG tablet Take 200 mg by mouth daily.    . Biotin 10000 MCG TABS Take 1 tablet by mouth  daily.    . carvedilol (COREG) 25 MG tablet Take 50 mg by mouth 2 (two) times daily with a meal.    . cinacalcet (SENSIPAR) 30 MG tablet Take 30 mg by mouth daily.    Marland Kitchen docusate sodium (PHILLIPS STOOL SOFTENER) 100 MG capsule Take 200 mg by mouth 2 (two) times daily.    . furosemide (LASIX) 80 MG tablet Take 80 mg by mouth every morning.    Marland Kitchen losartan (COZAAR) 100 MG  tablet Take 100 mg by mouth at bedtime.     . multivitamin (RENA-VIT) TABS tablet Take 1 tablet by mouth daily.    . polyethylene glycol powder (GLYCOLAX/MIRALAX) powder Take 17 g by mouth every other day. [place]    . sevelamer carbonate (RENVELA) 800 MG tablet Take 800-1,600 mg by mouth See admin instructions. 1,600 mg after meals TWO times a day and 800 mg after each snack    . warfarin (COUMADIN) 5 MG tablet Take 5-7.5 mg by mouth See admin instructions. 5 mg at bedtime on Sun/Mon/Tues/Thurs/Sat and 7.5 mg on Wed/Fri    . warfarin (COUMADIN) 5 MG tablet Take 1 tablet (5 mg total) by mouth daily. (Patient not taking: Reported on 06/25/2016)    . [DISCONTINUED] cephALEXin (KEFLEX) 500 MG capsule Take 1 capsule (500 mg total) by mouth 2 (two) times daily. (Patient not taking: Reported on 06/25/2016) 8 capsule 0    Blood pressure (!) 112/52, pulse (!) 57, temperature 98.3 F (36.8 C), temperature source Oral, resp. rate 14, height '5\' 5"'$  (1.651 m), weight 244 lb 0.8 oz (110.7 kg), SpO2 90 %.  Physical Exam: General: pleasant, WD/WN female who is laying in bed in NAD HEENT: head is normocephalic, atraumatic.  Sclera are noninjected. Oral mucosa is pink and moist Heart: regular, rate, and rhythm. Mild systolic murmur appreciated, normal S1 and S2, 2+ radial and DP pulses bilaterally Lungs: CTAB, no wheezes, rhonchi, or rales noted.  Respiratory effort nonlabored Abd: soft, ND, +BS, TTP to LLQ, no CVA tenderness MS: all 4 extremities are symmetrical, full ROM, no edema Skin: warm and dry with no rashes noted Psych: A&Ox3 with an appropriate affect. Neuro: CM grossly 2-12 intact, normal speech  Results for orders placed or performed during the hospital encounter of 06/25/16 (from the past 48 hour(s))  Protime-INR     Status: Abnormal   Collection Time: 06/25/16  4:04 PM  Result Value Ref Range   Prothrombin Time 26.2 (H) 11.4 - 15.2 seconds   INR 2.36   CBC with Differential/Platelet      Status: Abnormal   Collection Time: 06/25/16  4:04 PM  Result Value Ref Range   WBC 7.0 4.0 - 10.5 K/uL   RBC 3.96 3.87 - 5.11 MIL/uL   Hemoglobin 11.2 (L) 12.0 - 15.0 g/dL   HCT 35.6 (L) 36.0 - 46.0 %   MCV 89.9 78.0 - 100.0 fL   MCH 28.3 26.0 - 34.0 pg   MCHC 31.5 30.0 - 36.0 g/dL   RDW 16.2 (H) 11.5 - 15.5 %   Platelets 206 150 - 400 K/uL   Neutrophils Relative % 55 %   Neutro Abs 3.8 1.7 - 7.7 K/uL   Lymphocytes Relative 26 %   Lymphs Abs 1.8 0.7 - 4.0 K/uL   Monocytes Relative 15 %   Monocytes Absolute 1.0 0.1 - 1.0 K/uL   Eosinophils Relative 4 %   Eosinophils Absolute 0.3 0.0 - 0.7 K/uL   Basophils Relative 0 %  Basophils Absolute 0.0 0.0 - 0.1 K/uL  Comprehensive metabolic panel     Status: Abnormal   Collection Time: 06/25/16  4:04 PM  Result Value Ref Range   Sodium 139 135 - 145 mmol/L   Potassium 3.5 3.5 - 5.1 mmol/L   Chloride 95 (L) 101 - 111 mmol/L   CO2 30 22 - 32 mmol/L   Glucose, Bld 93 65 - 99 mg/dL   BUN 17 6 - 20 mg/dL   Creatinine, Ser 3.48 (H) 0.44 - 1.00 mg/dL   Calcium 8.9 8.9 - 10.3 mg/dL   Total Protein 7.9 6.5 - 8.1 g/dL   Albumin 3.6 3.5 - 5.0 g/dL   AST 18 15 - 41 U/L   ALT 15 14 - 54 U/L   Alkaline Phosphatase 113 38 - 126 U/L   Total Bilirubin 0.6 0.3 - 1.2 mg/dL   GFR calc non Af Amer 12 (L) >60 mL/min   GFR calc Af Amer 14 (L) >60 mL/min    Comment: (NOTE) The eGFR has been calculated using the CKD EPI equation. This calculation has not been validated in all clinical situations. eGFR's persistently <60 mL/min signify possible Chronic Kidney Disease.    Anion gap 14 5 - 15  I-stat troponin, ED     Status: None   Collection Time: 06/25/16  4:14 PM  Result Value Ref Range   Troponin i, poc 0.00 0.00 - 0.08 ng/mL   Comment 3            Comment: Due to the release kinetics of cTnI, a negative result within the first hours of the onset of symptoms does not rule out myocardial infarction with certainty. If myocardial infarction is  still suspected, repeat the test at appropriate intervals.   Troponin I     Status: None   Collection Time: 06/25/16  9:41 PM  Result Value Ref Range   Troponin I <0.03 <0.03 ng/mL  MRSA PCR Screening     Status: None   Collection Time: 06/25/16 10:25 PM  Result Value Ref Range   MRSA by PCR NEGATIVE NEGATIVE    Comment:        The GeneXpert MRSA Assay (FDA approved for NASAL specimens only), is one component of a comprehensive MRSA colonization surveillance program. It is not intended to diagnose MRSA infection nor to guide or monitor treatment for MRSA infections.   Troponin I     Status: None   Collection Time: 06/26/16  3:21 AM  Result Value Ref Range   Troponin I <0.03 <0.03 ng/mL  CBC     Status: Abnormal   Collection Time: 06/26/16  3:21 AM  Result Value Ref Range   WBC 5.8 4.0 - 10.5 K/uL   RBC 3.64 (L) 3.87 - 5.11 MIL/uL   Hemoglobin 10.5 (L) 12.0 - 15.0 g/dL   HCT 33.0 (L) 36.0 - 46.0 %   MCV 90.7 78.0 - 100.0 fL   MCH 28.8 26.0 - 34.0 pg   MCHC 31.8 30.0 - 36.0 g/dL   RDW 16.2 (H) 11.5 - 15.5 %   Platelets 225 150 - 400 K/uL  Comprehensive metabolic panel     Status: Abnormal   Collection Time: 06/26/16  3:21 AM  Result Value Ref Range   Sodium 138 135 - 145 mmol/L   Potassium 3.6 3.5 - 5.1 mmol/L   Chloride 97 (L) 101 - 111 mmol/L   CO2 34 (H) 22 - 32 mmol/L   Glucose, Bld 95 65 -  99 mg/dL   BUN 21 (H) 6 - 20 mg/dL   Creatinine, Ser 4.63 (H) 0.44 - 1.00 mg/dL   Calcium 8.6 (L) 8.9 - 10.3 mg/dL   Total Protein 7.0 6.5 - 8.1 g/dL   Albumin 3.1 (L) 3.5 - 5.0 g/dL   AST 18 15 - 41 U/L   ALT 12 (L) 14 - 54 U/L   Alkaline Phosphatase 105 38 - 126 U/L   Total Bilirubin 0.8 0.3 - 1.2 mg/dL   GFR calc non Af Amer 9 (L) >60 mL/min   GFR calc Af Amer 10 (L) >60 mL/min    Comment: (NOTE) The eGFR has been calculated using the CKD EPI equation. This calculation has not been validated in all clinical situations. eGFR's persistently <60 mL/min signify possible  Chronic Kidney Disease.    Anion gap 7 5 - 15  Troponin I     Status: None   Collection Time: 06/26/16  9:15 AM  Result Value Ref Range   Troponin I <0.03 <0.03 ng/mL  Protime-INR     Status: Abnormal   Collection Time: 06/26/16 11:29 AM  Result Value Ref Range   Prothrombin Time 26.5 (H) 11.4 - 15.2 seconds   INR 2.38   Renal function panel     Status: Abnormal   Collection Time: 06/26/16  4:18 PM  Result Value Ref Range   Sodium 134 (L) 135 - 145 mmol/L   Potassium 3.6 3.5 - 5.1 mmol/L   Chloride 94 (L) 101 - 111 mmol/L   CO2 28 22 - 32 mmol/L   Glucose, Bld 102 (H) 65 - 99 mg/dL   BUN 27 (H) 6 - 20 mg/dL   Creatinine, Ser 5.19 (H) 0.44 - 1.00 mg/dL   Calcium 8.5 (L) 8.9 - 10.3 mg/dL   Phosphorus 3.9 2.5 - 4.6 mg/dL   Albumin 3.4 (L) 3.5 - 5.0 g/dL   GFR calc non Af Amer 8 (L) >60 mL/min   GFR calc Af Amer 9 (L) >60 mL/min    Comment: (NOTE) The eGFR has been calculated using the CKD EPI equation. This calculation has not been validated in all clinical situations. eGFR's persistently <60 mL/min signify possible Chronic Kidney Disease.    Anion gap 12 5 - 15  CBC     Status: Abnormal   Collection Time: 06/26/16  4:18 PM  Result Value Ref Range   WBC 8.1 4.0 - 10.5 K/uL    Comment: REPEATED TO VERIFY WHITE COUNT CONFIRMED ON SMEAR    RBC 3.80 (L) 3.87 - 5.11 MIL/uL   Hemoglobin 11.1 (L) 12.0 - 15.0 g/dL   HCT 34.1 (L) 36.0 - 46.0 %   MCV 89.7 78.0 - 100.0 fL   MCH 29.2 26.0 - 34.0 pg   MCHC 32.6 30.0 - 36.0 g/dL   RDW 16.6 (H) 11.5 - 15.5 %   Platelets 247 150 - 400 K/uL  Protime-INR     Status: Abnormal   Collection Time: 06/27/16  4:33 AM  Result Value Ref Range   Prothrombin Time 29.1 (H) 11.4 - 15.2 seconds   INR 2.69   Lipid panel     Status: None   Collection Time: 06/27/16  4:33 AM  Result Value Ref Range   Cholesterol 130 0 - 200 mg/dL   Triglycerides 42 <150 mg/dL   HDL 49 >40 mg/dL   Total CHOL/HDL Ratio 2.7 RATIO   VLDL 8 0 - 40 mg/dL   LDL  Cholesterol 73 0 - 99  mg/dL    Comment:        Total Cholesterol/HDL:CHD Risk Coronary Heart Disease Risk Table                     Men   Women  1/2 Average Risk   3.4   3.3  Average Risk       5.0   4.4  2 X Average Risk   9.6   7.1  3 X Average Risk  23.4   11.0        Use the calculated Patient Ratio above and the CHD Risk Table to determine the patient's CHD Risk.        ATP III CLASSIFICATION (LDL):  <100     mg/dL   Optimal  100-129  mg/dL   Near or Above                    Optimal  130-159  mg/dL   Borderline  160-189  mg/dL   High  >190     mg/dL   Very High   Renal function panel     Status: Abnormal   Collection Time: 06/27/16  4:33 AM  Result Value Ref Range   Sodium 134 (L) 135 - 145 mmol/L   Potassium 3.3 (L) 3.5 - 5.1 mmol/L   Chloride 93 (L) 101 - 111 mmol/L   CO2 30 22 - 32 mmol/L   Glucose, Bld 92 65 - 99 mg/dL   BUN 32 (H) 6 - 20 mg/dL   Creatinine, Ser 6.20 (H) 0.44 - 1.00 mg/dL   Calcium 8.6 (L) 8.9 - 10.3 mg/dL   Phosphorus 4.8 (H) 2.5 - 4.6 mg/dL   Albumin 3.2 (L) 3.5 - 5.0 g/dL   GFR calc non Af Amer 6 (L) >60 mL/min   GFR calc Af Amer 7 (L) >60 mL/min    Comment: (NOTE) The eGFR has been calculated using the CKD EPI equation. This calculation has not been validated in all clinical situations. eGFR's persistently <60 mL/min signify possible Chronic Kidney Disease.    Anion gap 11 5 - 15  CBC     Status: Abnormal   Collection Time: 06/27/16  4:44 AM  Result Value Ref Range   WBC 5.8 4.0 - 10.5 K/uL   RBC 3.64 (L) 3.87 - 5.11 MIL/uL   Hemoglobin 10.3 (L) 12.0 - 15.0 g/dL   HCT 32.6 (L) 36.0 - 46.0 %   MCV 89.6 78.0 - 100.0 fL   MCH 28.3 26.0 - 34.0 pg   MCHC 31.6 30.0 - 36.0 g/dL   RDW 16.2 (H) 11.5 - 15.5 %   Platelets 242 150 - 400 K/uL   Dg Chest 2 View  Result Date: 06/25/2016 CLINICAL DATA:  Right-sided chest pain and shortness of breath, initial encounter EXAM: CHEST  2 VIEW COMPARISON:  None. FINDINGS: Cardiac shadow is at the  upper limits of normal in size. Aortic calcifications are noted. Long segment stenting in the region of the right shoulder is noted. The lungs are well aerated bilaterally. No focal infiltrate or sizable effusion is seen. No bony abnormality is noted. IMPRESSION: No acute abnormality seen. Electronically Signed   By: Inez Catalina M.D.   On: 06/25/2016 13:21   Ct Abdomen Pelvis W Contrast  Addendum Date: 06/25/2016   ADDENDUM REPORT: 06/25/2016 20:49 ADDENDUM: Upon further review, there are a few small locules of gas positioned just adjacent to the sigmoid colon, which likely reflect a small  micro perforation. This is best appreciated on coronal sequence (series 5, image 58). This is closely approximated to the adjacent uterus and left ovary. Additionally, possible small diverticular abscess present adjacent to the sigmoid measures approximately 2.5 x 1.0 cm (series 2, image 56). Given the relative lack of extensive inflammatory changes, these findings are suspected to have been ongoing/smoldering in nature, which could correlate with patient's waxing and waning symptoms. No definite mass lesion identified. Critical Value/emergent results were called by telephone at the time of interpretation on 06/25/2016 at 8:44 pm to Dr. Norina Buzzard , who verbally acknowledged these results. Electronically Signed   By: Jeannine Boga M.D.   On: 06/25/2016 20:49   Result Date: 06/25/2016 CLINICAL DATA:  Initial evaluation for acute left flank pain. EXAM: CT ABDOMEN AND PELVIS WITH CONTRAST TECHNIQUE: Multidetector CT imaging of the abdomen and pelvis was performed using the standard protocol following bolus administration of intravenous contrast. CONTRAST:  170m ISOVUE-300 IOPAMIDOL (ISOVUE-300) INJECTION 61% COMPARISON:  None available. FINDINGS: Lower chest: Visualized lung bases are clear. Mild cardiomegaly partially visualized. Hepatobiliary: Liver demonstrates a normal contrast enhanced appearance. Gallbladder  normal. No biliary dilatation. Pancreas: Pancreas within normal limits. Spleen: Spleen within normal limits. Adrenals/Urinary Tract: Adrenal glands are normal. Kidneys are somewhat small and atrophic bilaterally, compatible with history of end-stage renal disease. Innumerable cystic lesions present. The larger of these lesions measure water density common ir most compatible with cysts. The smaller lesions are too small the characterize. No hydroureter. Bladder largely decompressed. Mild circumferential bladder wall thickening most likely related incomplete distension. Stomach/Bowel: Stomach within normal limits. No evidence for bowel obstruction. Appendix normal. Sigmoid diverticulosis without imaging findings to suggest acute diverticulitis. No acute inflammatory changes identified about the bowels. Vascular/Lymphatic: Moderate aorto bi-iliac atherosclerotic disease. No aneurysm. Shotty subcentimeter retroperitoneal lymph nodes noted. No pathologically enlarged intra-abdominal or pelvic lymph nodes identified. Reproductive: Uterus within normal limits. Ovaries grossly unremarkable. Other: No free intraperitoneal air. No free fluid. Small fat containing paraumbilical hernia noted. Musculoskeletal: Postsurgical changes noted within the lumbar spine. No acute osseous abnormality. No worrisome lytic or blastic osseous lesions. IMPRESSION: 1. No CT evidence for acute intra-abdominal pelvic process. 2. Sigmoid diverticulosis without evidence for acute diverticulitis. 3. Small and atrophic kidneys bilaterally with multiple cysts present, compatible with history of end-stage renal disease. Electronically Signed: By: BJeannine BogaM.D. On: 06/25/2016 17:38   Assessment/Plan Diverticulitis with micro perforation - IV cipro 12/6>> and PO flagyl 12/6>> - pt's pain has improved without pain meds, afebrile, WBC normal, tolerating clears well - will advance to fulls  Chest pain-resolved, pt seen by cardiology, no  further ischemia testing indicated at this time. F/U with cards as outpatient. History of atrial fibrillation- on warfarin, amiodarone and Coreg.  Hypertension- on losartan, furosemide. ESRD on hemodialysis- MWF HD, nephrology following  FEN: fulls VTE: SCD's and warfarin  Plan: fulls, continue IV cipro and PO flagyl, ambulate, repeat CT scan if symptoms worsen. Check WBC with morning labs. We will continue to follow this patient. Thank you for the consult.    JKalman Drape PManatee Memorial HospitalSurgery 06/27/2016, 11:43 AM Pager: [phone]: [phone]-Fri 7:00 am-4:30 pm Sat-Sun 7:00 am-11:30 am

## 2016-06-27 NOTE — Progress Notes (Signed)
[facility]  PROGRESS NOTE  Lessa Nouri UUV:[id number] DOB: 16-Feb-1946 DOA: 06/25/2016 PCP: Viona Gilmore [facility]   Brief HPI:    70 y.o.femalewith a past medical history significant for ESRD on HD MWF, Afib on warfarin, HTN, and anemiawho presents with chest pain for 1 day and abdominal pain for 3 months.  the patient noted some LLQ abdominal pain over months. This was initially intermittent, aching, mild-to-moderate. Over time, it has progressed until now it is there almost all the time, and much more severe at times (for example, last night made her get out of bed from pain and walk around). It is without diarrhea, hematochezia (just her chronic blood on the toilet tissue), melena, vomiting, fever, or chills.   She saw her PCP about this recently, described it as post-prandial to him (to me she says it is after eating sometimes and sometimes spontaneous). He was concerned about worsening post-prandial pain being mesenteric ischemia and ordered lactic acid (normal) and mesenteric duplex US which hasn't been done yet.   Today, in the ER, the patient had a CT of the abdomen and pelvis with contrast that showed a subtle complex structure in the area of the colon, consistent with phlegmon, no drainable abscess, interpreted as contained perforation.   Subjective   Patient's moment denies any pain. She was started on clear liquid diet. And this tolerated the diet well   Assessment/Plan:     1. Diverticulitis with perforation and abscess- CT scan showed small locules of gas adjacent to sigmoid colon likely small microperforation. Small diverticular abscess adjacent to the sigmoid measures approximately 2.51 cm. Patient started on IV Cipro and Flagyl, slowly improving. White count is normal. She has been afebrile. Patient is tolerating clear liquid diet well. We'll consult general surgery for further management of the abscess. 2. Chest pain-resolved, patient was seen by  cardiology, no further ischemia testing indicated at this time. She'll follow up with cardiology as outpatient. Cardiac enzymes 3 are negative 3. History of atrial fibrillation- patient is on warfarin, amiodarone and Coreg. Heart rate is controlled. 4. Hypertension- blood pressure is controlled, continue losartan, furosemide. 5. ESRD on hemodialysis- patient gets dialysis Monday Wednesday Friday, nephrology following    DVT prophylaxis: Patient is on warfarin  Code Status: Full code  Family Communication: No family at bedside   Disposition Plan: Home once medically stable for discharge   Consultants:  Nephrology  Procedures:  None  Continuous infusions kvo    Antibiotics:   Anti-infectives    Start     Dose/Rate Route Frequency Ordered Stop   06/26/16 2200  ciprofloxacin (CIPRO) IVPB 400 mg     400 mg 200 mL/hr over 60 Minutes Intravenous Every 24 hours 06/26/16 0035     06/25/16 2200  ciprofloxacin (CIPRO) tablet 500 mg  Status:  Discontinued     500 mg Oral Every 24 hours 06/25/16 2146 06/26/16 0030   06/25/16 2200  metroNIDAZOLE (FLAGYL) tablet 500 mg     500 mg Oral Every 8 hours 06/25/16 2146 07/02/16 2159       Objective   Vitals:   06/27/16 0900 06/27/16 0930 06/27/16 1000 06/27/16 1030  BP: (!) 120/52 (!) 97/42 (!) 115/41 (!) 112/52  Pulse: 61 61 (!) 58 (!) 57  Resp:      Temp:      TempSrc:      SpO2:      Weight:      Height:        Intake/Output  Summary (Last 24 hours) at 06/27/16 1110 Last data filed at 06/27/16 0300  Gross per 24 hour  Intake              320 ml  Output                1 ml  Net              319 ml   Filed Weights   06/26/16 0526 06/27/16 0554 06/27/16 0644  Weight: 110.3 kg (243 lb 3.2 oz) 110.5 kg (243 lb 8 oz) 110.7 kg (244 lb 0.8 oz)     Physical Examination:  General exam: Appears calm and comfortable. Respiratory system: Clear to auscultation. Respiratory effort normal. Cardiovascular system:  RRR. No   murmurs, rubs, gallops. No pedal edema. GI system: Abdomen is nondistended, soft and nontender. No organomegaly.  Central nervous system. No focal neurological deficits. 5 x 5 power in all extremities. Skin: No rashes, lesions or ulcers. Psychiatry: Alert, oriented x 3.Judgement and insight appear normal. Affect normal.    Data Reviewed: I have personally reviewed following labs and imaging studies  CBG: No results for input(s): GLUCAP in the last 168 hours.  CBC:  Recent Labs Lab 06/25/16 1604 06/26/16 0321 06/26/16 1618 06/27/16 0444  WBC 7.0 5.8 8.1 5.8  NEUTROABS 3.8  --   --   --   HGB 11.2* 10.5* 11.1* 10.3*  HCT 35.6* 33.0* 34.1* 32.6*  MCV 89.9 90.7 89.7 89.6  PLT 206 225 247 885    Basic Metabolic Panel:  Recent Labs Lab 06/25/16 1604 06/26/16 0321 06/26/16 1618 06/27/16 0433  NA 139 138 134* 134*  K 3.5 3.6 3.6 3.3*  CL 95* 97* 94* 93*  CO2 30 34* 28 30  GLUCOSE 93 95 102* 92  BUN 17 21* 27* 32*  CREATININE 3.48* 4.63* 5.19* 6.20*  CALCIUM 8.9 8.6* 8.5* 8.6*  PHOS  --   --  3.9 4.8*    Recent Results (from the past 240 hour(s))  MRSA PCR Screening     Status: None   Collection Time: 06/25/16 10:25 PM  Result Value Ref Range Status   MRSA by PCR NEGATIVE NEGATIVE Final    Comment:        The GeneXpert MRSA Assay (FDA approved for NASAL specimens only), is one component of a comprehensive MRSA colonization surveillance program. It is not intended to diagnose MRSA infection nor to guide or monitor treatment for MRSA infections.      Liver Function Tests:  Recent Labs Lab 06/25/16 1604 06/26/16 0321 06/26/16 1618 06/27/16 0433  AST 18 18  --   --   ALT 15 12*  --   --   ALKPHOS 113 105  --   --   BILITOT 0.6 0.8  --   --   PROT 7.9 7.0  --   --   ALBUMIN 3.6 3.1* 3.4* 3.2*   No results for input(s): LIPASE, AMYLASE in the last 168 hours. No results for input(s): AMMONIA in the last 168 hours.  Cardiac Enzymes:  Recent  Labs Lab 06/25/16 2141 06/26/16 0321 06/26/16 0915  TROPONINI <0.03 <0.03 <0.03   BNP (last 3 results) No results for input(s): BNP in the last 8760 hours.  ProBNP (last 3 results) No results for input(s): PROBNP in the last 8760 hours.    Studies: Dg Chest 2 View  Result Date: 06/25/2016 CLINICAL DATA:  Right-sided chest pain and shortness of breath, initial encounter EXAM:  CHEST  2 VIEW COMPARISON:  None. FINDINGS: Cardiac shadow is at the upper limits of normal in size. Aortic calcifications are noted. Long segment stenting in the region of the right shoulder is noted. The lungs are well aerated bilaterally. No focal infiltrate or sizable effusion is seen. No bony abnormality is noted. IMPRESSION: No acute abnormality seen. Electronically Signed   By: Inez Catalina M.D.   On: 06/25/2016 13:21   Ct Abdomen Pelvis W Contrast  Addendum Date: 06/25/2016   ADDENDUM REPORT: 06/25/2016 20:49 ADDENDUM: Upon further review, there are a few small locules of gas positioned just adjacent to the sigmoid colon, which likely reflect a small micro perforation. This is best appreciated on coronal sequence (series 5, image 58). This is closely approximated to the adjacent uterus and left ovary. Additionally, possible small diverticular abscess present adjacent to the sigmoid measures approximately 2.5 x 1.0 cm (series 2, image 56). Given the relative lack of extensive inflammatory changes, these findings are suspected to have been ongoing/smoldering in nature, which could correlate with patient's waxing and waning symptoms. No definite mass lesion identified. Critical Value/emergent results were called by telephone at the time of interpretation on 06/25/2016 at 8:44 pm to Dr. Norina Buzzard , who verbally acknowledged these results. Electronically Signed   By: Jeannine Boga M.D.   On: 06/25/2016 20:49   Result Date: 06/25/2016 CLINICAL DATA:  Initial evaluation for acute left flank pain. EXAM: CT  ABDOMEN AND PELVIS WITH CONTRAST TECHNIQUE: Multidetector CT imaging of the abdomen and pelvis was performed using the standard protocol following bolus administration of intravenous contrast. CONTRAST:  123mL ISOVUE-300 IOPAMIDOL (ISOVUE-300) INJECTION 61% COMPARISON:  None available. FINDINGS: Lower chest: Visualized lung bases are clear. Mild cardiomegaly partially visualized. Hepatobiliary: Liver demonstrates a normal contrast enhanced appearance. Gallbladder normal. No biliary dilatation. Pancreas: Pancreas within normal limits. Spleen: Spleen within normal limits. Adrenals/Urinary Tract: Adrenal glands are normal. Kidneys are somewhat small and atrophic bilaterally, compatible with history of end-stage renal disease. Innumerable cystic lesions present. The larger of these lesions measure water density common ir most compatible with cysts. The smaller lesions are too small the characterize. No hydroureter. Bladder largely decompressed. Mild circumferential bladder wall thickening most likely related incomplete distension. Stomach/Bowel: Stomach within normal limits. No evidence for bowel obstruction. Appendix normal. Sigmoid diverticulosis without imaging findings to suggest acute diverticulitis. No acute inflammatory changes identified about the bowels. Vascular/Lymphatic: Moderate aorto bi-iliac atherosclerotic disease. No aneurysm. Shotty subcentimeter retroperitoneal lymph nodes noted. No pathologically enlarged intra-abdominal or pelvic lymph nodes identified. Reproductive: Uterus within normal limits. Ovaries grossly unremarkable. Other: No free intraperitoneal air. No free fluid. Small fat containing paraumbilical hernia noted. Musculoskeletal: Postsurgical changes noted within the lumbar spine. No acute osseous abnormality. No worrisome lytic or blastic osseous lesions. IMPRESSION: 1. No CT evidence for acute intra-abdominal pelvic process. 2. Sigmoid diverticulosis without evidence for acute  diverticulitis. 3. Small and atrophic kidneys bilaterally with multiple cysts present, compatible with history of end-stage renal disease. Electronically Signed: By: Jeannine Boga M.D. On: 06/25/2016 17:38    Scheduled Meds: . amiodarone  200 mg Oral Daily  . carvedilol  50 mg Oral BID WC  . cinacalcet  30 mg Oral Q breakfast  . ciprofloxacin  400 mg Intravenous Q24H  . [START ON 06/30/2016] darbepoetin (ARANESP) injection - DIALYSIS  60 mcg Intravenous Q Mon-HD  . docusate sodium  200 mg Oral BID  . furosemide  80 mg Oral q morning - 10a  . losartan  100 mg Oral QHS  . metroNIDAZOLE  500 mg Oral Q8H  . senna-docusate  1 tablet Oral BID  . sevelamer carbonate  1,600 mg Oral BID WC  . sodium chloride flush  3 mL Intravenous Q12H  . warfarin  2.5 mg Oral ONCE-1800  . Warfarin - Pharmacist Dosing Inpatient   Does not apply q1800      Time spent: 25 min  [facility] Pager [phone]. If 7PM-7AM, please contact night-coverage at [email or web address], Office  [phone]  password [email or web address] 06/27/2016, 11:10 AM  LOS: 1 day

## 2016-06-27 NOTE — Progress Notes (Signed)
[name], female   DOB: 26-Feb-1946, 70 y.o.   MRN: [id number]  [facility] Progress Note   Assessment/ Plan:   1. Acute diverticulitis with microperforation: Ongoing conservative management with intravenous ciprofloxacin and oral metronidazole. Agree with bowel rest in order to limit need for surgical intervention. 2.ESRD continue hemodialysis on a Monday/Wednesday/Friday schedule via right radiocephalic fistula. Her volume status is mildly hypervolemic. 3. Anemia: Denies any overt blood loss, hemoglobin levels acceptable but appear to be trending down-monitor with ESA. 4. CKD-MBD: Phosphorus binders currently on hold, continue Sensipar and VDRA for PTH suppression. 5. Nutrition: Currently on clear liquids for management of acute diverticulitis-monitor albumin/electrolytes. 6. Hypertension:Fairly controlled blood pressures with intermittent elevations-monitor with hemodialysis/ultrafiltration.  Subjective:   Reports to be feeling fair still with intermittent abdominal pain and some nausea. Paradoxically, she complains that we are starving her and she wants to eat something.    Objective:   BP (!) 120/52   Pulse 61   Temp 98.3 F (36.8 C) (Oral)   Resp 14   Ht 5\' 5"  (1.651 m)   Wt 110.7 kg (244 lb 0.8 oz) Comment: stood to scale   SpO2 90%   BMI 40.61 kg/m   Physical Exam: WUJ:[id number] resting on hemodialysis CVS: Pulse regular rhythm, normal rate, S1 and S2 normal Resp: Anteriorly clear to auscultation, no rales Abd: Soft, flat, mildly tender left lower quadrant Ext: Trace ankle edema  Labs: BMET  Recent Labs Lab 06/25/16 1604 06/26/16 0321 06/26/16 1618 06/27/16 0433  NA 139 138 134* 134*  K 3.5 3.6 3.6 3.3*  CL 95* 97* 94* 93*  CO2 30 34* 28 30  GLUCOSE 93 95 102* 92  BUN 17 21* 27* 32*  CREATININE 3.48* 4.63* 5.19* 6.20*  CALCIUM 8.9 8.6* 8.5* 8.6*  PHOS  --   --  3.9 4.8*   CBC  Recent Labs Lab 06/25/16 1604 06/26/16 0321  06/26/16 1618 06/27/16 0444  WBC 7.0 5.8 8.1 5.8  NEUTROABS 3.8  --   --   --   HGB 11.2* 10.5* 11.1* 10.3*  HCT 35.6* 33.0* 34.1* 32.6*  MCV 89.9 90.7 89.7 89.6  PLT 206 225 247 242   Medications:    . amiodarone  200 mg Oral Daily  . carvedilol  50 mg Oral BID WC  . cinacalcet  30 mg Oral Q breakfast  . ciprofloxacin  400 mg Intravenous Q24H  . docusate sodium  200 mg Oral BID  . furosemide  80 mg Oral q morning - 10a  . losartan  100 mg Oral QHS  . metroNIDAZOLE  500 mg Oral Q8H  . senna-docusate  1 tablet Oral BID  . sevelamer carbonate  1,600 mg Oral BID WC  . sodium chloride flush  3 mL Intravenous Q12H  . Warfarin - Pharmacist Dosing Inpatient   Does not apply A2130   Elmarie Shiley, MD 06/27/2016, 9:33 AM

## 2016-06-27 NOTE — Procedures (Signed)
Patient seen on Hemodialysis. QB 400, UF goal 2.5L Treatment adjusted as needed.  Elmarie Shiley MD [employer] [facility]. Office # [phone] Pager # [phone] 8:21 AM

## 2016-06-28 LAB — CBC
HEMATOCRIT: 33.5 % — AB (ref 36.0–46.0)
HEMOGLOBIN: 10.6 g/dL — AB (ref 12.0–15.0)
MCH: 28.8 pg (ref 26.0–34.0)
MCHC: 31.6 g/dL (ref 30.0–36.0)
MCV: 91 fL (ref 78.0–100.0)
Platelets: 226 10*3/uL (ref 150–400)
RBC: 3.68 MIL/uL — ABNORMAL LOW (ref 3.87–5.11)
RDW: 16.5 % — ABNORMAL HIGH (ref 11.5–15.5)
WBC: 6.9 10*3/uL (ref 4.0–10.5)

## 2016-06-28 LAB — BASIC METABOLIC PANEL
ANION GAP: 12 (ref 5–15)
BUN: 15 mg/dL (ref 6–20)
CO2: 28 mmol/L (ref 22–32)
Calcium: 8.8 mg/dL — ABNORMAL LOW (ref 8.9–10.3)
Chloride: 96 mmol/L — ABNORMAL LOW (ref 101–111)
Creatinine, Ser: 5.1 mg/dL — ABNORMAL HIGH (ref 0.44–1.00)
GFR calc Af Amer: 9 mL/min — ABNORMAL LOW (ref >60)
GFR, EST NON AFRICAN AMERICAN: 8 mL/min — AB (ref >60)
GLUCOSE: 85 mg/dL (ref 65–99)
POTASSIUM: 3.9 mmol/L (ref 3.5–5.1)
Sodium: 136 mmol/L (ref 135–145)

## 2016-06-28 LAB — PROTIME-INR
INR: 3.05
Prothrombin Time: 32.3 seconds — ABNORMAL HIGH (ref 11.4–15.2)

## 2016-06-28 MED ORDER — WARFARIN - PHARMACIST DOSING INPATIENT
Freq: Every day | Status: DC
  Administered 2016-07-01 – 2016-07-03 (×3)

## 2016-06-28 MED ORDER — WARFARIN SODIUM 2 MG PO TABS
2.0000 mg | ORAL_TABLET | Freq: Once | ORAL | Status: AC
  Administered 2016-06-28: 2 mg via ORAL
  Filled 2016-06-28: qty 1

## 2016-06-28 MED ORDER — CINACALCET HCL 30 MG PO TABS
30.0000 mg | ORAL_TABLET | Freq: Every day | ORAL | Status: DC
  Administered 2016-06-28 – 2016-07-17 (×16): 30 mg via ORAL
  Filled 2016-06-28 (×23): qty 1

## 2016-06-28 NOTE — Progress Notes (Signed)
PROGRESS NOTE    Jane [name]  DJM:[id number] DOB: October 23, 1945 DOA: 06/25/2016 PCP: [facility]    Brief Narrative:  This is a 70 year old female who presented to hospital with a chief complaint of left lower quadrant abdominal pain, ongoing for months, intermittent, colicky, moderate in severity. It has been worsening to the point where it became constant and severe in intensity. On initial physical examination patient was hemodynamically stable, her abdomen have left lower quadrant tenderness with no rebound, but mild guarding. CT of the abdomen and pelvis showed a few small locules of gas position just adjacent to the sigmoid colon, possible small diverticular abscess. Patient was admitted to the hospital for IV antibiotics and surgical consultation. Clinically she has been improving.  Assessment & Plan:   Principal Problem:   Diverticulitis of large intestine with perforation Active Problems:   ESRD on hemodialysis (HCC)   Paroxysmal atrial fibrillation (HCC)   Essential hypertension   Anemia in other chronic diseases classified elsewhere   Chest pain with moderate risk for cardiac etiology  1. Diverticulitis with perforation. Will continue antibiotic therapy with metronidazole and ciprofloxacin, will continue to advance diet as tolerated, continue pain control.   2. ESRD on HD. Patient clinically euvolemic, will continue Hd per nephrology recommendations, continue furosemide, renvela cencipar and darbapoetin.   3. HTN. Continue blood pressure control with losartan and carvedilol.   4. Paroxysmal atrial fibrillation. Will continue coreg and amiodarone, ekg personally reviewed noted sinus rhythm with left atrial enlargement, continue anticoagulation with warfarin with INR at 3.0, follow pharmacy protocol.   5. Anemia of chronicrenal disease. HB and Hct stable, 10.6-33.5, will continue aranesp.     DVT prophylaxis: enoxaparin  Code Status: full  Family Communication:  No family at the bedside.  Disposition Plan: home   Consultants:   Surgery   Procedures:     Antimicrobials:   Metronidazole Ciprofloxacin   Subjective: Patient with no chest pain, no dyspnea, no vomiting, but positive nausea. Abdominal pain is worse while moving bowels, no radiation, improved with pain medications, no associated symptoms.   Objective: Vitals:   06/27/16 1509 06/27/16 2035 06/28/16 0500 06/28/16 0808  BP: (!) 143/49 (!) 122/49 (!) 128/50 (!) 121/53  Pulse: 69  62 61  Resp: 16  16   Temp: 98.6 F (37 C) 98.6 F (37 C) 99 F (37.2 C)   TempSrc: Oral Oral Oral   SpO2: 95% 92% 92%   Weight:   108.4 kg (238 lb 14.4 oz)   Height:        Intake/Output Summary (Last 24 hours) at 06/28/16 0841 Last data filed at 06/27/16 2033  Gross per 24 hour  Intake              200 ml  Output             2000 ml  Net            -1800 ml   Filed Weights   06/27/16 0644 06/27/16 1045 06/28/16 0500  Weight: 110.7 kg (244 lb 0.8 oz) 108.1 kg (238 lb 5.1 oz) 108.4 kg (238 lb 14.4 oz)    Examination:  General exam: patient not in pain or dyspnea E ENT: no pallor or icterus.  Respiratory system: Clear to auscultation. Respiratory effort normal. No wheezing, rales or rhonchi.  Cardiovascular system: S1 & S2 heard, RRR. No JVD, murmurs, rubs, gallops or clicks. trace edema. Gastrointestinal system: Abdomen is nondistended, soft. Mild tender to deep palpation.  No organomegaly or masses felt. Normal bowel sounds heard. Central nervous system: Alert and oriented. No focal neurological deficits. Extremities: Symmetric 5 x 5 power. Skin: No rashes, lesions or ulcers.   Data Reviewed: I have personally reviewed following labs and imaging studies  CBC:  Recent Labs Lab 06/25/16 1604 06/26/16 0321 06/26/16 1618 06/27/16 0444 06/28/16 0428  WBC 7.0 5.8 8.1 5.8 6.9  NEUTROABS 3.8  --   --   --   --   HGB 11.2* 10.5* 11.1* 10.3* 10.6*  HCT 35.6* 33.0* 34.1* 32.6* 33.5*    MCV 89.9 90.7 89.7 89.6 91.0  PLT 206 225 247 242 952   Basic Metabolic Panel:  Recent Labs Lab 06/25/16 1604 06/26/16 0321 06/26/16 1618 06/27/16 0433 06/28/16 0428  NA 139 138 134* 134* 136  K 3.5 3.6 3.6 3.3* 3.9  CL 95* 97* 94* 93* 96*  CO2 30 34* 28 30 28   GLUCOSE 93 95 102* 92 85  BUN 17 21* 27* 32* 15  CREATININE 3.48* 4.63* 5.19* 6.20* 5.10*  CALCIUM 8.9 8.6* 8.5* 8.6* 8.8*  PHOS  --   --  3.9 4.8*  --    GFR: Estimated Creatinine Clearance: 12.2 mL/min (by C-G formula based on SCr of 5.1 mg/dL (H)). Liver Function Tests:  Recent Labs Lab 06/25/16 1604 06/26/16 0321 06/26/16 1618 06/27/16 0433  AST 18 18  --   --   ALT 15 12*  --   --   ALKPHOS 113 105  --   --   BILITOT 0.6 0.8  --   --   PROT 7.9 7.0  --   --   ALBUMIN 3.6 3.1* 3.4* 3.2*   No results for input(s): LIPASE, AMYLASE in the last 168 hours. No results for input(s): AMMONIA in the last 168 hours. Coagulation Profile:  Recent Labs Lab 06/25/16 1604 06/26/16 1129 06/27/16 0433 06/28/16 0428  INR 2.36 2.38 2.69 3.05   Cardiac Enzymes:  Recent Labs Lab 06/25/16 2141 06/26/16 0321 06/26/16 0915  TROPONINI <0.03 <0.03 <0.03   BNP (last 3 results) No results for input(s): PROBNP in the last 8760 hours. HbA1C: No results for input(s): HGBA1C in the last 72 hours. CBG: No results for input(s): GLUCAP in the last 168 hours. Lipid Profile:  Recent Labs  06/27/16 0433  CHOL 130  HDL 49  LDLCALC 73  TRIG 42  CHOLHDL 2.7   Thyroid Function Tests: No results for input(s): TSH, T4TOTAL, FREET4, T3FREE, THYROIDAB in the last 72 hours. Anemia Panel: No results for input(s): VITAMINB12, FOLATE, FERRITIN, TIBC, IRON, RETICCTPCT in the last 72 hours. Sepsis Labs: No results for input(s): PROCALCITON, LATICACIDVEN in the last 168 hours.  Recent Results (from the past 240 hour(s))  MRSA PCR Screening     Status: None   Collection Time: 06/25/16 10:25 PM  Result Value Ref Range  Status   MRSA by PCR NEGATIVE NEGATIVE Final    Comment:        The GeneXpert MRSA Assay (FDA approved for NASAL specimens only), is one component of a comprehensive MRSA colonization surveillance program. It is not intended to diagnose MRSA infection nor to guide or monitor treatment for MRSA infections.          Radiology Studies: No results found.      Scheduled Meds: . amiodarone  200 mg Oral Daily  . carvedilol  50 mg Oral BID WC  . cinacalcet  30 mg Oral Q supper  . ciprofloxacin  400  mg Intravenous Q24H  . [START ON 06/30/2016] darbepoetin (ARANESP) injection - DIALYSIS  60 mcg Intravenous Q Mon-HD  . docusate sodium  200 mg Oral BID  . furosemide  80 mg Oral q morning - 10a  . losartan  100 mg Oral QHS  . metroNIDAZOLE  500 mg Oral Q8H  . senna-docusate  1 tablet Oral BID  . sevelamer carbonate  1,600 mg Oral BID WC  . sodium chloride flush  3 mL Intravenous Q12H  . Warfarin - Pharmacist Dosing Inpatient   Does not apply q1800   Continuous Infusions:   LOS: 2 days      Carolos Fecher Gerome Apley, MD [facility] Pager [phone]  If 7PM-7AM, please contact night-coverage [email or web address] Password [email or web address] 06/28/2016, 8:41 AM

## 2016-06-28 NOTE — Progress Notes (Addendum)
ANTICOAGULATION CONSULT NOTE - Follow Up Consult  Pharmacy Consult for Coumadin Indication: atrial fibrillation  Patient Measurements: Height: 5\' 5"  (165.1 cm) Weight: 238 lb 14.4 oz (108.4 kg) IBW/kg (Calculated) : 57  Vital Signs: Temp: 99 F (37.2 C) (12/09 0500) Temp Source: Oral (12/09 0500) BP: 121/53 (12/09 0808) Pulse Rate: 61 (12/09 0808)  Labs:  Recent Labs  06/25/16 2141 06/26/16 0321 06/26/16 0915 06/26/16 1129 06/26/16 1618 06/27/16 0433 06/27/16 0444 06/28/16 0428  HGB  --  10.5*  --   --  11.1*  --  10.3* 10.6*  HCT  --  33.0*  --   --  34.1*  --  32.6* 33.5*  PLT  --  225  --   --  247  --  242 226  LABPROT  --   --   --  26.5*  --  29.1*  --  32.3*  INR  --   --   --  2.38  --  2.69  --  3.05  CREATININE  --  4.63*  --   --  5.19* 6.20*  --  5.10*  TROPONINI <0.03 <0.03 <0.03  --   --   --   --   --     Estimated Creatinine Clearance: 12.2 mL/min (by C-G formula based on SCr of 5.1 mg/dL (H)).  Assessment:  70 yo female on coumadin PTA for h/o Afib. Severe drug/lab interaction with Flagyl. INR with bump today 2.69 > 3.05. Expect for this to trend down with lower doses being given. Hgb stable. - PTA dose coumadin 5 mg MTThSS, 7.5mg  W/Fri, INR 2.36  Goal of Therapy:  INR 2-3 Monitor platelets by anticoagulation protocol: Yes   Plan:  Warfarin 2mg *1 tonight Daily PT/INR  Melburn Popper, PharmD Clinical Pharmacy Resident Pager: [phone] 06/28/16 9:28 AM

## 2016-06-28 NOTE — Progress Notes (Signed)
  Subjective: Intermittent LLQ pain, took some pain meds  Objective: Vital signs in last 24 hours: Temp:  [98.3 F (36.8 C)-99 F (37.2 C)] 99 F (37.2 C) (12/09 0500) Pulse Rate:  [57-69] 61 (12/09 0808) Resp:  [16] 16 (12/09 0500) BP: (97-143)/(41-53) 121/53 (12/09 0808) SpO2:  [92 %-95 %] 92 % (12/09 0500) Weight:  [108.1 kg (238 lb 5.1 oz)-108.4 kg (238 lb 14.4 oz)] 108.4 kg (238 lb 14.4 oz) (12/09 0500) Last BM Date: 06/27/16  Intake/Output from previous day: 12/08 0701 - 12/09 0700 In: 200 [IV Piggyback:200] Out: 2000  Intake/Output this shift: No intake/output data recorded.  General appearance: cooperative Resp: clear to auscultation bilaterally GI: soft, mild tenderness LLQ  Lab Results:   Recent Labs  06/27/16 0444 06/28/16 0428  WBC 5.8 6.9  HGB 10.3* 10.6*  HCT 32.6* 33.5*  PLT 242 226   BMET  Recent Labs  06/27/16 0433 06/28/16 0428  NA 134* 136  K 3.3* 3.9  CL 93* 96*  CO2 30 28  GLUCOSE 92 85  BUN 32* 15  CREATININE 6.20* 5.10*  CALCIUM 8.6* 8.8*   PT/INR  Recent Labs  06/27/16 0433 06/28/16 0428  LABPROT 29.1* 32.3*  INR 2.69 3.05   ABG No results for input(s): PHART, HCO3 in the last 72 hours.  Invalid input(s): PCO2, PO2  Studies/Results: No results found.  Anti-infectives: Anti-infectives    Start     Dose/Rate Route Frequency Ordered Stop   06/26/16 2200  ciprofloxacin (CIPRO) IVPB 400 mg     400 mg 200 mL/hr over 60 Minutes Intravenous Every 24 hours 06/26/16 0035     06/25/16 2200  ciprofloxacin (CIPRO) tablet 500 mg  Status:  Discontinued     500 mg Oral Every 24 hours 06/25/16 2146 06/26/16 0030   06/25/16 2200  metroNIDAZOLE (FLAGYL) tablet 500 mg     500 mg Oral Every 8 hours 06/25/16 2146 07/02/16 2159      Assessment/Plan: Sigmoid diverticulitis with microperf - continue clears and ABX, will follow  LOS: 2 days    Benett Swoyer E 06/28/2016

## 2016-06-28 NOTE — Progress Notes (Signed)
[name], female   DOB: 1946/04/16, 70 y.o.   MRN: [id number]  [facility] Progress Note   Assessment/ Plan:   1. Acute diverticulitis with microperforation: Conservative management with antibiotics and clear liquids with ongoing surgical evaluation for triggers for surgical intervention. She appears to be clinically making slow progress. 2.ESRD continue hemodialysis on a Monday/Wednesday/Friday schedule via right radiocephalic fistula. She does not have any acute indications for hemodialysis today 3. Anemia: Denies any overt blood loss, hemoglobin levels currently appears stable with ESA. 4. CKD-MBD: Phosphorus binders currently on hold, continue Sensipar and VDRA for PTH suppression. 5. Nutrition: Currently on clear liquids for management of acute diverticulitis-monitor albumin/electrolytes. 6. Hypertension:Fairly controlled blood pressures with intermittent elevations-monitor with hemodialysis/ultrafiltration.  Subjective:   Reports intermittent LLQ abdominal pain particularly when she has a bowel movement. She denies any hematochezia.    Objective:   BP (!) 121/53   Pulse 61   Temp 99 F (37.2 C) (Oral)   Resp 16   Ht 5\' 5"  (1.651 m)   Wt 108.4 kg (238 lb 14.4 oz)   SpO2 92%   BMI 39.76 kg/m   Physical Exam: MGQ:[id number] resting on side of bed CVS: Pulse regular rhythm, normal rate, S1 and S2 normal Resp: Anteriorly clear to auscultation, no rales Abd: Soft, flat, mildly tender left lower quadrant Ext: No edema over legs  Labs: BMET  Recent Labs Lab 06/25/16 1604 06/26/16 0321 06/26/16 1618 06/27/16 0433 06/28/16 0428  NA 139 138 134* 134* 136  K 3.5 3.6 3.6 3.3* 3.9  CL 95* 97* 94* 93* 96*  CO2 30 34* 28 30 28   GLUCOSE 93 95 102* 92 85  BUN 17 21* 27* 32* 15  CREATININE 3.48* 4.63* 5.19* 6.20* 5.10*  CALCIUM 8.9 8.6* 8.5* 8.6* 8.8*  PHOS  --   --  3.9 4.8*  --    CBC  Recent Labs Lab 06/25/16 1604 06/26/16 0321  06/26/16 1618 06/27/16 0444 06/28/16 0428  WBC 7.0 5.8 8.1 5.8 6.9  NEUTROABS 3.8  --   --   --   --   HGB 11.2* 10.5* 11.1* 10.3* 10.6*  HCT 35.6* 33.0* 34.1* 32.6* 33.5*  MCV 89.9 90.7 89.7 89.6 91.0  PLT 206 225 247 242 226   Medications:    . amiodarone  200 mg Oral Daily  . carvedilol  50 mg Oral BID WC  . cinacalcet  30 mg Oral Q supper  . ciprofloxacin  400 mg Intravenous Q24H  . [START ON 06/30/2016] darbepoetin (ARANESP) injection - DIALYSIS  60 mcg Intravenous Q Mon-HD  . docusate sodium  200 mg Oral BID  . furosemide  80 mg Oral q morning - 10a  . losartan  100 mg Oral QHS  . metroNIDAZOLE  500 mg Oral Q8H  . senna-docusate  1 tablet Oral BID  . sevelamer carbonate  1,600 mg Oral BID WC  . sodium chloride flush  3 mL Intravenous Q12H  . Warfarin - Pharmacist Dosing Inpatient   Does not apply Z1245   Elmarie Shiley, MD 06/28/2016, 9:05 AM

## 2016-06-29 LAB — BASIC METABOLIC PANEL
ANION GAP: 9 (ref 5–15)
BUN: 22 mg/dL — ABNORMAL HIGH (ref 6–20)
CALCIUM: 8.2 mg/dL — AB (ref 8.9–10.3)
CO2: 28 mmol/L (ref 22–32)
CREATININE: 7.42 mg/dL — AB (ref 0.44–1.00)
Chloride: 95 mmol/L — ABNORMAL LOW (ref 101–111)
GFR, EST AFRICAN AMERICAN: 6 mL/min — AB (ref >60)
GFR, EST NON AFRICAN AMERICAN: 5 mL/min — AB (ref >60)
Glucose, Bld: 96 mg/dL (ref 65–99)
Potassium: 4.2 mmol/L (ref 3.5–5.1)
SODIUM: 132 mmol/L — AB (ref 135–145)

## 2016-06-29 LAB — CBC WITH DIFFERENTIAL/PLATELET
BASOS ABS: 0 10*3/uL (ref 0.0–0.1)
BASOS PCT: 0 %
EOS ABS: 0.3 10*3/uL (ref 0.0–0.7)
EOS PCT: 5 %
HCT: 32.2 % — ABNORMAL LOW (ref 36.0–46.0)
Hemoglobin: 10.1 g/dL — ABNORMAL LOW (ref 12.0–15.0)
Lymphocytes Relative: 32 %
Lymphs Abs: 1.9 10*3/uL (ref 0.7–4.0)
MCH: 28.8 pg (ref 26.0–34.0)
MCHC: 31.4 g/dL (ref 30.0–36.0)
MCV: 91.7 fL (ref 78.0–100.0)
MONO ABS: 0.9 10*3/uL (ref 0.1–1.0)
Monocytes Relative: 15 %
NEUTROS ABS: 2.9 10*3/uL (ref 1.7–7.7)
Neutrophils Relative %: 48 %
PLATELETS: 237 10*3/uL (ref 150–400)
RBC: 3.51 MIL/uL — ABNORMAL LOW (ref 3.87–5.11)
RDW: 17 % — AB (ref 11.5–15.5)
WBC: 6 10*3/uL (ref 4.0–10.5)

## 2016-06-29 LAB — PROTIME-INR
INR: 3.69
PROTHROMBIN TIME: 37.5 s — AB (ref 11.4–15.2)

## 2016-06-29 NOTE — Progress Notes (Signed)
PROGRESS NOTE    Jane [name]  HUT:[id number] DOB: 1945-11-28 DOA: 06/25/2016 PCP: [facility]    Brief Narrative:  This is a 70 year old female who presented to hospital with a chief complaint of left lower quadrant abdominal pain, ongoing for months, intermittent, colicky, moderate in severity. It has been worsening to the point where it became constant and severe in intensity. On initial physical examination patient was hemodynamically stable, her abdomen have left lower quadrant tenderness with no rebound, but mild guarding. CT of the abdomen and pelvis showed a few small locules of gas position just adjacent to the sigmoid colon, possible small diverticular abscess. Patient was admitted to the hospital for IV antibiotics and surgical consultation. Clinically she has been improving, still on clears.    Assessment & Plan:   Principal Problem:   Diverticulitis of large intestine with perforation Active Problems:   ESRD on hemodialysis (HCC)   Paroxysmal atrial fibrillation (HCC)   Essential hypertension   Anemia in other chronic diseases classified elsewhere   Chest pain with moderate risk for cardiac etiology  1. Diverticulitis with perforation. Continue antibiotic therapy with metronidazole and ciprofloxacin, plan to advance diet as tolerated to full liquids, continue pain control with hydrocodone and acetaminophen.   2. ESRD on HD. Patient clinically continue euvolemic. Hd per nephrology recommendations, schedule M-W-F. Continue furosemide, renvela cencipar and darbapoetin.   3. HTN. Noted low blood pressure systolic 87 to 656, will hold on losartan for now and will continue coreg.   4. Paroxysmal atrial fibrillation. Will continue coreg and amiodarone, heart rate in the 60's. Continue to hold on warfarin, known drug-drug interaction between fluoroquinolones and warfarin. INR at 3.69.    5. Anemia of chronicrenal disease. HB and Hct stable, 10.1 and 32. Continue  aranesp.    DVT prophylaxis: enoxaparin  Code Status: full  Family Communication: No family at the bedside.  Disposition Plan: home   Consultants:   Surgery   Procedures:     Antimicrobials:   Metronidazole Ciprofloxacin    Subjective: Patient with improved abdominal pain, more localized on the left lower quadrant, worse to touch and with bowel movements, no nausea or vomiting. Poor appetite.   Objective: Vitals:   06/28/16 1500 06/28/16 1644 06/28/16 2035 06/29/16 0500  BP: 118/61 (!) 101/44 (!) 131/31 (!) 114/40  Pulse: 60 (!) 57 62 66  Resp: 15  16 16   Temp: 98.9 F (37.2 C)  98.6 F (37 C) 98 F (36.7 C)  TempSrc: Oral  Oral Oral  SpO2: 94%  94% 92%  Weight:    107.6 kg (237 lb 3.2 oz)  Height:        Intake/Output Summary (Last 24 hours) at 06/29/16 0803 Last data filed at 06/28/16 2200  Gross per 24 hour  Intake              800 ml  Output                0 ml  Net              800 ml   Filed Weights   06/27/16 1045 06/28/16 0500 06/29/16 0500  Weight: 108.1 kg (238 lb 5.1 oz) 108.4 kg (238 lb 14.4 oz) 107.6 kg (237 lb 3.2 oz)    Examination:  General exam: Appears calm and comfortable  Respiratory system: Clear to auscultation. Respiratory effort normal. Cardiovascular system: S1 & S2 heard, RRR. No JVD, murmurs, rubs, gallops or clicks. No pedal edema.  Gastrointestinal system: Abdomen is nondistended, soft and nontender. No organomegaly or masses felt. Normal bowel sounds heard. Central nervous system: Alert and oriented. No focal neurological deficits. Extremities: Symmetric 5 x 5 power. Skin: No rashes, lesions or ulcers Psychiatry: Judgement and insight appear normal. Mood & affect appropriate.     Data Reviewed: I have personally reviewed following labs and imaging studies  CBC:  Recent Labs Lab 06/25/16 1604 06/26/16 0321 06/26/16 1618 06/27/16 0444 06/28/16 0428 06/29/16 0248  WBC 7.0 5.8 8.1 5.8 6.9 6.0  NEUTROABS 3.8  --    --   --   --  2.9  HGB 11.2* 10.5* 11.1* 10.3* 10.6* 10.1*  HCT 35.6* 33.0* 34.1* 32.6* 33.5* 32.2*  MCV 89.9 90.7 89.7 89.6 91.0 91.7  PLT 206 225 247 242 226 825   Basic Metabolic Panel:  Recent Labs Lab 06/26/16 0321 06/26/16 1618 06/27/16 0433 06/28/16 0428 06/29/16 0248  NA 138 134* 134* 136 132*  K 3.6 3.6 3.3* 3.9 4.2  CL 97* 94* 93* 96* 95*  CO2 34* 28 30 28 28   GLUCOSE 95 102* 92 85 96  BUN 21* 27* 32* 15 22*  CREATININE 4.63* 5.19* 6.20* 5.10* 7.42*  CALCIUM 8.6* 8.5* 8.6* 8.8* 8.2*  PHOS  --  3.9 4.8*  --   --    GFR: Estimated Creatinine Clearance: 8.4 mL/min (by C-G formula based on SCr of 7.42 mg/dL (H)). Liver Function Tests:  Recent Labs Lab 06/25/16 1604 06/26/16 0321 06/26/16 1618 06/27/16 0433  AST 18 18  --   --   ALT 15 12*  --   --   ALKPHOS 113 105  --   --   BILITOT 0.6 0.8  --   --   PROT 7.9 7.0  --   --   ALBUMIN 3.6 3.1* 3.4* 3.2*   No results for input(s): LIPASE, AMYLASE in the last 168 hours. No results for input(s): AMMONIA in the last 168 hours. Coagulation Profile:  Recent Labs Lab 06/25/16 1604 06/26/16 1129 06/27/16 0433 06/28/16 0428 06/29/16 0248  INR 2.36 2.38 2.69 3.05 3.69   Cardiac Enzymes:  Recent Labs Lab 06/25/16 2141 06/26/16 0321 06/26/16 0915  TROPONINI <0.03 <0.03 <0.03   BNP (last 3 results) No results for input(s): PROBNP in the last 8760 hours. HbA1C: No results for input(s): HGBA1C in the last 72 hours. CBG: No results for input(s): GLUCAP in the last 168 hours. Lipid Profile:  Recent Labs  06/27/16 0433  CHOL 130  HDL 49  LDLCALC 73  TRIG 42  CHOLHDL 2.7   Thyroid Function Tests: No results for input(s): TSH, T4TOTAL, FREET4, T3FREE, THYROIDAB in the last 72 hours. Anemia Panel: No results for input(s): VITAMINB12, FOLATE, FERRITIN, TIBC, IRON, RETICCTPCT in the last 72 hours. Sepsis Labs: No results for input(s): PROCALCITON, LATICACIDVEN in the last 168 hours.  Recent  Results (from the past 240 hour(s))  MRSA PCR Screening     Status: None   Collection Time: 06/25/16 10:25 PM  Result Value Ref Range Status   MRSA by PCR NEGATIVE NEGATIVE Final    Comment:        The GeneXpert MRSA Assay (FDA approved for NASAL specimens only), is one component of a comprehensive MRSA colonization surveillance program. It is not intended to diagnose MRSA infection nor to guide or monitor treatment for MRSA infections.          Radiology Studies: No results found.      Scheduled Meds: .  amiodarone  200 mg Oral Daily  . carvedilol  50 mg Oral BID WC  . cinacalcet  30 mg Oral Q supper  . ciprofloxacin  400 mg Intravenous Q24H  . [START ON 06/30/2016] darbepoetin (ARANESP) injection - DIALYSIS  60 mcg Intravenous Q Mon-HD  . docusate sodium  200 mg Oral BID  . furosemide  80 mg Oral q morning - 10a  . losartan  100 mg Oral QHS  . metroNIDAZOLE  500 mg Oral Q8H  . senna-docusate  1 tablet Oral BID  . sevelamer carbonate  1,600 mg Oral BID WC  . sodium chloride flush  3 mL Intravenous Q12H  . Warfarin - Pharmacist Dosing Inpatient   Does not apply q1800   Continuous Infusions:   LOS: 3 days       Dudley Mages Gerome Apley, MD [facility] Pager [phone]  If 7PM-7AM, please contact night-coverage [email or web address] Password [email or web address] 06/29/2016, 8:03 AM

## 2016-06-29 NOTE — Progress Notes (Signed)
  Subjective: Pain a little better, walking  Objective: Vital signs in last 24 hours: Temp:  [98 F (36.7 C)-98.9 F (37.2 C)] 98 F (36.7 C) (12/10 0500) Pulse Rate:  [57-66] 66 (12/10 0500) Resp:  [15-16] 16 (12/10 0500) BP: (101-131)/(31-61) 114/40 (12/10 0500) SpO2:  [92 %-94 %] 92 % (12/10 0500) Weight:  [107.6 kg (237 lb 3.2 oz)] 107.6 kg (237 lb 3.2 oz) (12/10 0500) Last BM Date: 06/27/16  Intake/Output from previous day: 12/09 0701 - 12/10 0700 In: 800 [P.O.:600; IV Piggyback:200] Out: -  Intake/Output this shift: No intake/output data recorded.  General appearance: cooperative Resp: clear to auscultation bilaterally GI: soft, mild tenderness LLQ  Lab Results:   Recent Labs  06/28/16 0428 06/29/16 0248  WBC 6.9 6.0  HGB 10.6* 10.1*  HCT 33.5* 32.2*  PLT 226 237   BMET  Recent Labs  06/28/16 0428 06/29/16 0248  NA 136 132*  K 3.9 4.2  CL 96* 95*  CO2 28 28  GLUCOSE 85 96  BUN 15 22*  CREATININE 5.10* 7.42*  CALCIUM 8.8* 8.2*   PT/INR  Recent Labs  06/28/16 0428 06/29/16 0248  LABPROT 32.3* 37.5*  INR 3.05 3.69   ABG No results for input(s): PHART, HCO3 in the last 72 hours.  Invalid input(s): PCO2, PO2  Studies/Results: No results found.  Anti-infectives: Anti-infectives    Start     Dose/Rate Route Frequency Ordered Stop   06/26/16 2200  ciprofloxacin (CIPRO) IVPB 400 mg     400 mg 200 mL/hr over 60 Minutes Intravenous Every 24 hours 06/26/16 0035     06/25/16 2200  ciprofloxacin (CIPRO) tablet 500 mg  Status:  Discontinued     500 mg Oral Every 24 hours 06/25/16 2146 06/26/16 0030   06/25/16 2200  metroNIDAZOLE (FLAGYL) tablet 500 mg     500 mg Oral Every 8 hours 06/25/16 2146 07/02/16 2159      Assessment/Plan: Sigmoid diverticulitis with microperf - full liquids, ABX  LOS: 3 days    [name] E 06/29/2016

## 2016-06-29 NOTE — Progress Notes (Signed)
Pt stated she felt like she was getting a yeast infection. She also stated she felt like she might be getting a UTI. Md on call made aware. New order received for a UA. Will cont to monitor pt.

## 2016-06-29 NOTE — Progress Notes (Signed)
[name], female   DOB: 12-Jul-1946, 70 y.o.   MRN: [id number]  [facility] Progress Note   Assessment/ Plan:   1. Acute sigmoid diverticulitis with microperforation: Conservative management with antibiotics and full liquids with ongoing surgical evaluation for triggers for surgical intervention. Clinically appears to be making slow progress. 2.ESRD continue hemodialysis on a Monday/Wednesday/Friday schedule via right radiocephalic fistula. I have ordered for hemodialysis again tomorrow. She does not have any compelling/acute indications for dialysis today. 3. Anemia: Denies any overt blood loss, hemoglobin levels currently appears stable with ESA. 4. CKD-MBD: Phosphorus binders currently on hold, continue Sensipar and VDRA for PTH suppression. 5. Nutrition: On full liquids for management of acute diverticulitis-monitor albumin/electrolytes. 6. Hypertension:Fairly controlled blood pressures with intermittent elevations-monitor with hemodialysis/ultrafiltration.  Subjective:   Reports slow improvement of her abdominal pain-still aggravated when she has a bowel movement. She denies any nausea or vomiting and does not have any hematochezia.    Objective:   BP (!) 112/52   Pulse 61   Temp 98 F (36.7 C) (Oral)   Resp 16   Ht 5\' 5"  (1.651 m)   Wt 107.6 kg (237 lb 3.2 oz)   SpO2 92%   BMI 39.47 kg/m   Physical Exam: RWE:[id number] resting on side of bed CVS: Pulse regular rhythm, normal rate, S1 and S2 normal Resp: Anteriorly clear to auscultation, no rales Abd: Soft, flat, mildly tender left lower quadrant Ext: No edema over legs  Labs: BMET  Recent Labs Lab 06/25/16 1604 06/26/16 0321 06/26/16 1618 06/27/16 0433 06/28/16 0428 06/29/16 0248  NA 139 138 134* 134* 136 132*  K 3.5 3.6 3.6 3.3* 3.9 4.2  CL 95* 97* 94* 93* 96* 95*  CO2 30 34* 28 30 28 28   GLUCOSE 93 95 102* 92 85 96  BUN 17 21* 27* 32* 15 22*  CREATININE 3.48* 4.63* 5.19* 6.20*  5.10* 7.42*  CALCIUM 8.9 8.6* 8.5* 8.6* 8.8* 8.2*  PHOS  --   --  3.9 4.8*  --   --    CBC  Recent Labs Lab 06/25/16 1604  06/26/16 1618 06/27/16 0444 06/28/16 0428 06/29/16 0248  WBC 7.0  < > 8.1 5.8 6.9 6.0  NEUTROABS 3.8  --   --   --   --  2.9  HGB 11.2*  < > 11.1* 10.3* 10.6* 10.1*  HCT 35.6*  < > 34.1* 32.6* 33.5* 32.2*  MCV 89.9  < > 89.7 89.6 91.0 91.7  PLT 206  < > 247 242 226 237  < > = values in this interval not displayed. Medications:    . amiodarone  200 mg Oral Daily  . carvedilol  50 mg Oral BID WC  . cinacalcet  30 mg Oral Q supper  . ciprofloxacin  400 mg Intravenous Q24H  . [START ON 06/30/2016] darbepoetin (ARANESP) injection - DIALYSIS  60 mcg Intravenous Q Mon-HD  . docusate sodium  200 mg Oral BID  . furosemide  80 mg Oral q morning - 10a  . losartan  100 mg Oral QHS  . metroNIDAZOLE  500 mg Oral Q8H  . senna-docusate  1 tablet Oral BID  . sevelamer carbonate  1,600 mg Oral BID WC  . sodium chloride flush  3 mL Intravenous Q12H  . Warfarin - Pharmacist Dosing Inpatient   Does not apply [id number]   Elmarie Shiley, MD 06/29/2016, 9:25 AM

## 2016-06-29 NOTE — Progress Notes (Signed)
ANTICOAGULATION CONSULT NOTE - Follow Up Consult  Pharmacy Consult for Coumadin Indication: atrial fibrillation  Patient Measurements: Height: 5\' 5"  (165.1 cm) Weight: 237 lb 3.2 oz (107.6 kg) IBW/kg (Calculated) : 57  Vital Signs: Temp: 98 F (36.7 C) (12/10 0500) Temp Source: Oral (12/10 0500) BP: 114/40 (12/10 0500) Pulse Rate: 66 (12/10 0500)  Labs:  Recent Labs  06/26/16 0915  06/27/16 0433 06/27/16 0444 06/28/16 0428 06/29/16 0248  HGB  --   < >  --  10.3* 10.6* 10.1*  HCT  --   < >  --  32.6* 33.5* 32.2*  PLT  --   < >  --  242 226 237  LABPROT  --   < > 29.1*  --  32.3* 37.5*  INR  --   < > 2.69  --  3.05 3.69  CREATININE  --   < > 6.20*  --  5.10* 7.42*  TROPONINI <0.03  --   --   --   --   --   < > = values in this interval not displayed.  Estimated Creatinine Clearance: 8.4 mL/min (by C-G formula based on SCr of 7.42 mg/dL (H)).  Assessment:  70 yo female on coumadin PTA for h/o Afib. Severe drug/lab interaction with Flagyl. INR continues to increase today 2.69 > 3.05 > 3.69. Lower doses have been given and expect for this to trend down soon. Will go ahead and hold tonight. Hgb stable.  PTA dose coumadin 5 mg MTThSS, 7.5mg  W/Fri  Goal of Therapy:  INR 2-3 Monitor platelets by anticoagulation protocol: Yes   Plan:  Hold warfarin tonight Daily PT/INR  Melburn Popper, PharmD Clinical Pharmacy Resident Pager: [phone] 06/29/16 8:46 AM

## 2016-06-29 NOTE — Progress Notes (Signed)
Pharmacy Antibiotic Note  Jane [name] is a 70 y.o. female admitted on 06/25/2016 with diverticulitis.  Pharmacy has been consulted for ciprofloxacin dosing.  Patient also on flagyl and gets HD on MWF.  Plan: Ciprofloxacin 400 IV Q24H  F/u LOT and when to switch to PO  Height: 5\' 5"  (165.1 cm) Weight: 237 lb 3.2 oz (107.6 kg) IBW/kg (Calculated) : 57  Temp (24hrs), Avg:98.5 F (36.9 C), Min:98 F (36.7 C), Max:98.9 F (37.2 C)   Recent Labs Lab 06/26/16 0321 06/26/16 1618 06/27/16 0433 06/27/16 0444 06/28/16 0428 06/29/16 0248  WBC 5.8 8.1  --  5.8 6.9 6.0  CREATININE 4.63* 5.19* 6.20*  --  5.10* 7.42*    Estimated Creatinine Clearance: 8.4 mL/min (by C-G formula based on SCr of 7.42 mg/dL (H)).    Allergies  Allergen Reactions  . Tape Other (See Comments)    PLASTIC TAPE TEARS OFF THE SKIN AND BRUISES IT TERRIBLY!!    Antimicrobials this admission: Cipro 12/6 >> Flagyl 12/6 >> (12/13)  Dose adjustments this admission: NA  Microbiology results: NA  Thank you for allowing pharmacy to be a part of this patient's care.  Melburn Popper 06/29/2016 8:54 AM

## 2016-06-30 LAB — BASIC METABOLIC PANEL
ANION GAP: 13 (ref 5–15)
BUN: 30 mg/dL — ABNORMAL HIGH (ref 6–20)
CO2: 24 mmol/L (ref 22–32)
Calcium: 7.9 mg/dL — ABNORMAL LOW (ref 8.9–10.3)
Chloride: 94 mmol/L — ABNORMAL LOW (ref 101–111)
Creatinine, Ser: 9.77 mg/dL — ABNORMAL HIGH (ref 0.44–1.00)
GFR calc Af Amer: 4 mL/min — ABNORMAL LOW (ref >60)
GFR calc non Af Amer: 3 mL/min — ABNORMAL LOW (ref >60)
GLUCOSE: 87 mg/dL (ref 65–99)
POTASSIUM: 4.1 mmol/L (ref 3.5–5.1)
Sodium: 131 mmol/L — ABNORMAL LOW (ref 135–145)

## 2016-06-30 LAB — CBC WITH DIFFERENTIAL/PLATELET
BASOS ABS: 0 10*3/uL (ref 0.0–0.1)
Basophils Relative: 0 %
Eosinophils Absolute: 0.3 10*3/uL (ref 0.0–0.7)
Eosinophils Relative: 5 %
HEMATOCRIT: 33.3 % — AB (ref 36.0–46.0)
Hemoglobin: 10.3 g/dL — ABNORMAL LOW (ref 12.0–15.0)
LYMPHS ABS: 1.9 10*3/uL (ref 0.7–4.0)
LYMPHS PCT: 34 %
MCH: 28.3 pg (ref 26.0–34.0)
MCHC: 30.9 g/dL (ref 30.0–36.0)
MCV: 91.5 fL (ref 78.0–100.0)
MONO ABS: 0.8 10*3/uL (ref 0.1–1.0)
Monocytes Relative: 14 %
Neutro Abs: 2.6 10*3/uL (ref 1.7–7.7)
Neutrophils Relative %: 47 %
Platelets: 252 10*3/uL (ref 150–400)
RBC: 3.64 MIL/uL — AB (ref 3.87–5.11)
RDW: 16.6 % — ABNORMAL HIGH (ref 11.5–15.5)
WBC: 5.6 10*3/uL (ref 4.0–10.5)

## 2016-06-30 LAB — PROTIME-INR
INR: 3.52
Prothrombin Time: 36.1 seconds — ABNORMAL HIGH (ref 11.4–15.2)

## 2016-06-30 LAB — URINALYSIS, ROUTINE W REFLEX MICROSCOPIC
Glucose, UA: NEGATIVE mg/dL
HGB URINE DIPSTICK: NEGATIVE
KETONES UR: NEGATIVE mg/dL
Nitrite: NEGATIVE
PROTEIN: 30 mg/dL — AB
Specific Gravity, Urine: 1.02 (ref 1.005–1.030)
pH: 5 (ref 5.0–8.0)

## 2016-06-30 MED ORDER — CARVEDILOL 25 MG PO TABS
25.0000 mg | ORAL_TABLET | Freq: Two times a day (BID) | ORAL | Status: DC
  Administered 2016-06-30 – 2016-07-01 (×2): 25 mg via ORAL
  Filled 2016-06-30 (×9): qty 1

## 2016-06-30 NOTE — Progress Notes (Signed)
[facility] Progress Note   Subjective: ate solid food today first time here  Vitals:   06/29/16 1431 06/29/16 1447 06/29/16 2135 06/30/16 0502  BP: (!) 87/32 93/64 (!) 116/54 (!) 101/41  Pulse: 64  66 62  Resp:      Temp: 99.2 F (37.3 C)  98 F (36.7 C) 99.3 F (37.4 C)  TempSrc: Oral  Oral Oral  SpO2: 93%  94% 93%  Weight:    109.9 kg (242 lb 3.2 oz)  Height:        Inpatient medications: . amiodarone  200 mg Oral Daily  . carvedilol  25 mg Oral BID WC  . cinacalcet  30 mg Oral Q supper  . ciprofloxacin  400 mg Intravenous Q24H  . darbepoetin (ARANESP) injection - DIALYSIS  60 mcg Intravenous Q Mon-HD  . docusate sodium  200 mg Oral BID  . furosemide  80 mg Oral q morning - 10a  . metroNIDAZOLE  500 mg Oral Q8H  . senna-docusate  1 tablet Oral BID  . sevelamer carbonate  1,600 mg Oral BID WC  . sodium chloride flush  3 mL Intravenous Q12H  . Warfarin - Pharmacist Dosing Inpatient   Does not apply q1800    acetaminophen **OR** acetaminophen, HYDROcodone-acetaminophen, ondansetron **OR** ondansetron (ZOFRAN) IV, sevelamer carbonate  Exam: Alert, no distress NO jvd Chest clear bilat RRR no mrg Abd obese soft ntnd Ext no edema R RC AVF  Dialysis: [facility] MWF  3h 7min  111.5kg  2/2 bath  Hep 2000 Hect 3 ug M-50 q 2, last 12/6 Hb 11.1, pth 394  Ca 8.8  P 4.8  renvela 2 ac, sensipar 30/d      Assessment: 1  Acute diverticulitis - improving, on abx 2  ESRD HD mwf 3  Atypical CP - trops neg, no further w/u per cards 4  HTN cont coreg/ losart/ lasix 5  Volume is under dry 1-2kg 6  Afib on coumadin 7  MBD cont meds  Plan - HD today, UF 1-2 kg as Ronnie Derby MD [employer] pager [phone]   06/30/2016, 1:42 PM    Recent Labs Lab 06/26/16 1618 06/27/16 0433 06/28/16 0428 06/29/16 0248 06/30/16 0508  NA 134* 134* 136 132* 131*  K 3.6 3.3* 3.9 4.2 4.1  CL 94* 93* 96* 95* 94*  CO2 28 30 28 28 24   GLUCOSE 102* 92  85 96 87  BUN 27* 32* 15 22* 30*  CREATININE 5.19* 6.20* 5.10* 7.42* 9.77*  CALCIUM 8.5* 8.6* 8.8* 8.2* 7.9*  PHOS 3.9 4.8*  --   --   --     Recent Labs Lab 06/25/16 1604 06/26/16 0321 06/26/16 1618 06/27/16 0433  AST 18 18  --   --   ALT 15 12*  --   --   ALKPHOS 113 105  --   --   BILITOT 0.6 0.8  --   --   PROT 7.9 7.0  --   --   ALBUMIN 3.6 3.1* 3.4* 3.2*    Recent Labs Lab 06/25/16 1604  06/28/16 0428 06/29/16 0248 06/30/16 0508  WBC 7.0  < > 6.9 6.0 5.6  NEUTROABS 3.8  --   --  2.9 2.6  HGB 11.2*  < > 10.6* 10.1* 10.3*  HCT 35.6*  < > 33.5* 32.2* 33.3*  MCV 89.9  < > 91.0 91.7 91.5  PLT 206  < > 226 237 252  < > = values  in this interval not displayed. Iron/TIBC/Ferritin/ %Sat No results found for: IRON, TIBC, FERRITIN, IRONPCTSAT

## 2016-06-30 NOTE — Progress Notes (Signed)
PROGRESS NOTE    Jane [name]  VOH:[id number] DOB: May 14, 1946 DOA: 06/25/2016 PCP: [place]   Brief Narrative:   70 y.o.BF PMHx ESRD on HD M/W/F, A-Fib on warfarin, HTN, and anemia   Who presents with chest pain for 1 day and abdominal pain for 3 months.  Regarding chest pain, this was a new complaint today.  The patient went to HD, and during her session, developed a sharp, severe, constant pain under her right breast associated with shortness of breath and nausea.  She finished her treatment, then the HD nurse administered O2 and nitroglycerin, which caused her pain to subside, and recommended she go to the ER.   Subjective: 12/11 A/O 4, negative N/V, negative abdominal pain. States able to consume current diet without problem.     Assessment & Plan:   Principal Problem:   Diverticulitis of large intestine with perforation Active Problems:   ESRD on hemodialysis (HCC)   Paroxysmal atrial fibrillation (HCC)   Essential hypertension   Anemia in other chronic diseases classified elsewhere   Chest pain with moderate risk for cardiac etiology   Diverticulitis with perforation.  -Continue antibiotic therapy with metronidazole and ciprofloxacin. -Advance diet to soft per surgery -Continue current pain regimen.  ESRD on HD M/W/F,.  -Patient clinically continue euvolemic.  -HD per nephrology.  HTN.  -Patient's BP currently borderline hypotensive. Hold losartan. -Continue Amiodarone 200 mg daily -Decrease Coreg to 25 mg  BID -Lasix 80 mg daily  Paroxysmal atrial fibrillation.  -Currently in NSR -Currently supratherapeutic on Coumadin most likely secondary to interaction between fluoroquinolones and warfarin. Continue to monitor closely. No signs of overt bleeding.Marland Kitchen  Recent Labs Lab 06/26/16 1129 06/27/16 0433 06/28/16 0428 06/29/16 0248 06/30/16 0508  INR 2.38 2.69 3.05 3.69 3.52  -Per cardiology no further workup required.   Anemia of chronic  Renal disease.   -No signs of obstructive bleed  Recent Labs Lab 06/26/16 1618 06/27/16 0444 06/28/16 0428 06/29/16 0248 06/30/16 0508  HGB 11.1* 10.3* 10.6* 10.1* 10.3*  -Stable    DVT prophylaxis: Coumadin per pharmacy Code Status: Full Family Communication: None Disposition Plan: Per surgery   Consultants:  Dr.THOMPSON,BURKE  Trauma surgery Dr Skeet Latch. Cardiology [facility]   Procedures/Significant Events:  NA   VENTILATOR SETTINGS: NA   Cultures 12/6 MRSA by PCR negative    Antimicrobials: Ciprofloxacin 12/6>> Metronidazole 12/6>>    Devices NA   LINES / TUBES:  NA    Continuous Infusions:   Objective: Vitals:   06/29/16 1431 06/29/16 1447 06/29/16 2135 06/30/16 0502  BP: (!) 87/32 93/64 (!) 116/54 (!) 101/41  Pulse: 64  66 62  Resp:      Temp: 99.2 F (37.3 C)  98 F (36.7 C) 99.3 F (37.4 C)  TempSrc: Oral  Oral Oral  SpO2: 93%  94% 93%  Weight:    109.9 kg (242 lb 3.2 oz)  Height:        Intake/Output Summary (Last 24 hours) at 06/30/16 1007 Last data filed at 06/29/16 1800  Gross per 24 hour  Intake              240 ml  Output                0 ml  Net              240 ml   Filed Weights   06/28/16 0500 06/29/16 0500 06/30/16 0502  Weight: 108.4 kg (238 lb 14.4  oz) 107.6 kg (237 lb 3.2 oz) 109.9 kg (242 lb 3.2 oz)    Examination:  General: A/O 4, negative N/V, negative abdominal pain.No acute respiratory distress Eyes: negative scleral hemorrhage, negative anisocoria, negative icterus ENT: Negative Runny nose, negative gingival bleeding, Neck:  Negative scars, masses, torticollis, lymphadenopathy, JVD Lungs: Clear to auscultation bilaterally without wheezes or crackles Cardiovascular: Regular rate and rhythm without murmur gallop or rub normal S1 and S2 Abdomen: negative abdominal pain, nondistended, positive soft, bowel sounds, no rebound, no ascites, no appreciable  mass Extremities: No significant cyanosis, clubbing, or edema bilateral lower extremities Skin: Negative rashes, lesions, ulcers Psychiatric:  Negative depression, negative anxiety, negative fatigue, negative mania  Central nervous system:  Cranial nerves II through XII intact, tongue/uvula midline, all extremities muscle strength 5/5, sensation intact throughout,s, negative dysarthria, negative expressive aphasia, negative receptive aphasia.  .     Data Reviewed: Care during the described time interval was provided by me .  I have reviewed this patient's available data, including medical history, events of note, physical examination, and all test results as part of my evaluation. I have personally reviewed and interpreted all radiology studies.  CBC:  Recent Labs Lab 06/25/16 1604  06/26/16 1618 06/27/16 0444 06/28/16 0428 06/29/16 0248 06/30/16 0508  WBC 7.0  < > 8.1 5.8 6.9 6.0 5.6  NEUTROABS 3.8  --   --   --   --  2.9 2.6  HGB 11.2*  < > 11.1* 10.3* 10.6* 10.1* 10.3*  HCT 35.6*  < > 34.1* 32.6* 33.5* 32.2* 33.3*  MCV 89.9  < > 89.7 89.6 91.0 91.7 91.5  PLT 206  < > 247 242 226 237 252  < > = values in this interval not displayed. Basic Metabolic Panel:  Recent Labs Lab 06/26/16 1618 06/27/16 0433 06/28/16 0428 06/29/16 0248 06/30/16 0508  NA 134* 134* 136 132* 131*  K 3.6 3.3* 3.9 4.2 4.1  CL 94* 93* 96* 95* 94*  CO2 28 30 28 28 24   GLUCOSE 102* 92 85 96 87  BUN 27* 32* 15 22* 30*  CREATININE 5.19* 6.20* 5.10* 7.42* 9.77*  CALCIUM 8.5* 8.6* 8.8* 8.2* 7.9*  PHOS 3.9 4.8*  --   --   --    GFR: Estimated Creatinine Clearance: 6.4 mL/min (by C-G formula based on SCr of 9.77 mg/dL (H)). Liver Function Tests:  Recent Labs Lab 06/25/16 1604 06/26/16 0321 06/26/16 1618 06/27/16 0433  AST 18 18  --   --   ALT 15 12*  --   --   ALKPHOS 113 105  --   --   BILITOT 0.6 0.8  --   --   PROT 7.9 7.0  --   --   ALBUMIN 3.6 3.1* 3.4* 3.2*   No results for input(s):  LIPASE, AMYLASE in the last 168 hours. No results for input(s): AMMONIA in the last 168 hours. Coagulation Profile:  Recent Labs Lab 06/26/16 1129 06/27/16 0433 06/28/16 0428 06/29/16 0248 06/30/16 0508  INR 2.38 2.69 3.05 3.69 3.52   Cardiac Enzymes:  Recent Labs Lab 06/25/16 2141 06/26/16 0321 06/26/16 0915  TROPONINI <0.03 <0.03 <0.03   BNP (last 3 results) No results for input(s): PROBNP in the last 8760 hours. HbA1C: No results for input(s): HGBA1C in the last 72 hours. CBG: No results for input(s): GLUCAP in the last 168 hours. Lipid Profile: No results for input(s): CHOL, HDL, LDLCALC, TRIG, CHOLHDL, LDLDIRECT in the last 72 hours. Thyroid Function Tests: No  results for input(s): TSH, T4TOTAL, FREET4, T3FREE, THYROIDAB in the last 72 hours. Anemia Panel: No results for input(s): VITAMINB12, FOLATE, FERRITIN, TIBC, IRON, RETICCTPCT in the last 72 hours. Urine analysis:    Component Value Date/Time   COLORURINE AMBER (A) 06/30/2016 0503   APPEARANCEUR HAZY (A) 06/30/2016 0503   LABSPEC 1.020 06/30/2016 0503   PHURINE 5.0 06/30/2016 0503   GLUCOSEU NEGATIVE 06/30/2016 0503   HGBUR NEGATIVE 06/30/2016 0503   BILIRUBINUR SMALL (A) 06/30/2016 0503   KETONESUR NEGATIVE 06/30/2016 0503   PROTEINUR 30 (A) 06/30/2016 0503   UROBILINOGEN 0.2 06/30/2012 2201   NITRITE NEGATIVE 06/30/2016 0503   LEUKOCYTESUR SMALL (A) 06/30/2016 0503   Sepsis Labs: @LABRCNTIP (procalcitonin:4,lacticidven:4)  ) Recent Results (from the past 240 hour(s))  MRSA PCR Screening     Status: None   Collection Time: 06/25/16 10:25 PM  Result Value Ref Range Status   MRSA by PCR NEGATIVE NEGATIVE Final    Comment:        The GeneXpert MRSA Assay (FDA approved for NASAL specimens only), is one component of a comprehensive MRSA colonization surveillance program. It is not intended to diagnose MRSA infection nor to guide or monitor treatment for MRSA infections.           Radiology Studies: No results found.      Scheduled Meds: . amiodarone  200 mg Oral Daily  . carvedilol  25 mg Oral BID WC  . cinacalcet  30 mg Oral Q supper  . ciprofloxacin  400 mg Intravenous Q24H  . darbepoetin (ARANESP) injection - DIALYSIS  60 mcg Intravenous Q Mon-HD  . docusate sodium  200 mg Oral BID  . furosemide  80 mg Oral q morning - 10a  . metroNIDAZOLE  500 mg Oral Q8H  . senna-docusate  1 tablet Oral BID  . sevelamer carbonate  1,600 mg Oral BID WC  . sodium chloride flush  3 mL Intravenous Q12H  . Warfarin - Pharmacist Dosing Inpatient   Does not apply q1800   Continuous Infusions:   LOS: 4 days    Time spent: 40 minutes    WOODS, Geraldo Docker, MD [facility] Pager [phone]   If 7PM-7AM, please contact night-coverage [email or web address] Password [email or web address] 06/30/2016, 10:07 AM

## 2016-06-30 NOTE — Care Management Important Message (Signed)
Important Message  Patient Details  Name: Jane [name] MRN: [id number] Date of Birth: 1946/01/30   Medicare Important Message Given:  Yes    Emerson Barretto Abena 06/30/2016, 10:03 AM

## 2016-06-30 NOTE — Progress Notes (Signed)
ANTICOAGULATION CONSULT NOTE - Follow Up Consult  Pharmacy Consult for Coumadin Indication: atrial fibrillation  Patient Measurements: Height: 5\' 5"  (165.1 cm) Weight: 242 lb 3.2 oz (109.9 kg) IBW/kg (Calculated) : 57  Vital Signs: Temp: 99.3 F (37.4 C) (12/11 0502) Temp Source: Oral (12/11 0502) BP: 101/41 (12/11 0502) Pulse Rate: 62 (12/11 0502)  Labs:  Recent Labs  06/28/16 0428 06/29/16 0248 06/30/16 0508  HGB 10.6* 10.1* 10.3*  HCT 33.5* 32.2* 33.3*  PLT 226 237 252  LABPROT 32.3* 37.5* 36.1*  INR 3.05 3.69 3.52  CREATININE 5.10* 7.42* 9.77*    Estimated Creatinine Clearance: 6.4 mL/min (by C-G formula based on SCr of 9.77 mg/dL (H)).  Assessment:  70 yo female on coumadin PTA for h/o Afib. Severe drug/lab interaction with Flagyl. INR a little lower today after holding Coumadin yesterday, and smaller doses 12/8 and 12/9  PTA dose coumadin 5 mg MTThSS, 7.5mg  W/Fri  Goal of Therapy:  INR 2-3 Monitor platelets by anticoagulation protocol: Yes   Plan:  No Coumadin tonight.  Will need adjustment of Coumadin dose at discharge.  Previous PTA dose will be too much with concomitant Flagyl. Hard to predict "right" dose at this point, will resume lower dose once INR closer to 3.  Likely will need INR recheck soon after discharge.  Uvaldo Rising, BCPS  Clinical Pharmacist Pager [phone]  06/30/2016 11:21 AM

## 2016-06-30 NOTE — Progress Notes (Signed)
[facility] Progress Note    Subjective: Patient having some nausea from the oral antibiotics.  Pain is improved and almost gone  Objective: Vital signs in last 24 hours: Temp:  [98 F (36.7 C)-99.3 F (37.4 C)] 99.3 F (37.4 C) (12/11 0502) Pulse Rate:  [61-66] 62 (12/11 0502) BP: (87-116)/(32-64) 101/41 (12/11 0502) SpO2:  [93 %-94 %] 93 % (12/11 0502) Weight:  [109.9 kg (242 lb 3.2 oz)] 109.9 kg (242 lb 3.2 oz) (12/11 0502) Last BM Date: 06/27/16  Intake/Output from previous day: 12/10 0701 - 12/11 0700 In: 480 [P.O.:480] Out: -  Intake/Output this shift: No intake/output data recorded.  General: No acute distress.  Nauseated.  Lungs: Clear  Abd: Great bowel sounds.  Mild tenderness in the LLQ.  No rebound our guarding.  Extremities: No changes  Neuro: Intact  Lab Results:  @LABLAST2 (wbc:2,hgb:2,hct:2,plt:2) BMET ) Recent Labs  06/29/16 0248 06/30/16 0508  NA 132* 131*  K 4.2 4.1  CL 95* 94*  CO2 28 24  GLUCOSE 96 87  BUN 22* 30*  CREATININE 7.42* 9.77*  CALCIUM 8.2* 7.9*   PT/INR  Recent Labs  06/29/16 0248 06/30/16 0508  LABPROT 37.5* 36.1*  INR 3.69 3.52   ABG No results for input(s): PHART, HCO3 in the last 72 hours.  Invalid input(s): PCO2, PO2  Studies/Results: No results found.  Anti-infectives: Anti-infectives    Start     Dose/Rate Route Frequency Ordered Stop   06/26/16 2200  ciprofloxacin (CIPRO) IVPB 400 mg     400 mg 200 mL/hr over 60 Minutes Intravenous Every 24 hours 06/26/16 0035     06/25/16 2200  ciprofloxacin (CIPRO) tablet 500 mg  Status:  Discontinued     500 mg Oral Every 24 hours 06/25/16 2146 06/26/16 0030   06/25/16 2200  metroNIDAZOLE (FLAGYL) tablet 500 mg     500 mg Oral Every 8 hours 06/25/16 2146 07/02/16 2159      Assessment/Plan: s/p  Doiong better.    Will advance to Soft diet.  Home soon.  LOS: 4 days   Kathryne Eriksson. Dahlia Bailiff, MD, FACS [phone] [facility] 06/30/2016

## 2016-07-01 LAB — RENAL FUNCTION PANEL
ALBUMIN: 3.2 g/dL — AB (ref 3.5–5.0)
ANION GAP: 10 (ref 5–15)
BUN: 41 mg/dL — AB (ref 6–20)
CALCIUM: 7.9 mg/dL — AB (ref 8.9–10.3)
CO2: 26 mmol/L (ref 22–32)
Chloride: 95 mmol/L — ABNORMAL LOW (ref 101–111)
Creatinine, Ser: 11.53 mg/dL — ABNORMAL HIGH (ref 0.44–1.00)
GFR calc Af Amer: 3 mL/min — ABNORMAL LOW (ref >60)
GFR, EST NON AFRICAN AMERICAN: 3 mL/min — AB (ref >60)
GLUCOSE: 104 mg/dL — AB (ref 65–99)
PHOSPHORUS: 6 mg/dL — AB (ref 2.5–4.6)
Potassium: 4.1 mmol/L (ref 3.5–5.1)
SODIUM: 131 mmol/L — AB (ref 135–145)

## 2016-07-01 LAB — CBC
HEMATOCRIT: 32.1 % — AB (ref 36.0–46.0)
HEMOGLOBIN: 10.2 g/dL — AB (ref 12.0–15.0)
MCH: 28.5 pg (ref 26.0–34.0)
MCHC: 31.8 g/dL (ref 30.0–36.0)
MCV: 89.7 fL (ref 78.0–100.0)
Platelets: 271 10*3/uL (ref 150–400)
RBC: 3.58 MIL/uL — ABNORMAL LOW (ref 3.87–5.11)
RDW: 16.6 % — ABNORMAL HIGH (ref 11.5–15.5)
WBC: 5.5 10*3/uL (ref 4.0–10.5)

## 2016-07-01 LAB — PROTIME-INR
INR: 3.04
PROTHROMBIN TIME: 32.2 s — AB (ref 11.4–15.2)

## 2016-07-01 MED ORDER — CIPROFLOXACIN HCL 500 MG PO TABS
500.0000 mg | ORAL_TABLET | Freq: Every day | ORAL | Status: DC
  Administered 2016-07-01 – 2016-07-02 (×2): 500 mg via ORAL
  Filled 2016-07-01 (×2): qty 1

## 2016-07-01 MED ORDER — ALUM & MAG HYDROXIDE-SIMETH 200-200-20 MG/5ML PO SUSP: 30.0000 mL | ORAL | Status: DC | PRN

## 2016-07-01 MED ORDER — WARFARIN SODIUM 1 MG PO TABS
1.0000 mg | ORAL_TABLET | Freq: Once | ORAL | Status: AC
  Administered 2016-07-01: 1 mg via ORAL
  Filled 2016-07-01: qty 1

## 2016-07-01 MED ORDER — POLYETHYLENE GLYCOL 3350 17 G PO PACK
17.0000 g | PACK | Freq: Every day | ORAL | Status: DC
  Administered 2016-07-01: 17 g via ORAL
  Filled 2016-07-01: qty 1

## 2016-07-01 MED ORDER — BISACODYL 10 MG RE SUPP
10.0000 mg | Freq: Once | RECTAL | Status: AC
  Administered 2016-07-01: 10 mg via RECTAL
  Filled 2016-07-01: qty 1

## 2016-07-01 MED ORDER — DARBEPOETIN ALFA 60 MCG/0.3ML IJ SOSY
PREFILLED_SYRINGE | INTRAMUSCULAR | Status: AC
  Filled 2016-07-01: qty 0.3

## 2016-07-01 NOTE — Progress Notes (Signed)
PROGRESS NOTE    Jane [name]  BJS:[id number] DOB: 08/03/1945 DOA: 06/25/2016 PCP: [facility]   Brief Narrative:   70 y.o.BF PMHx ESRD on HD M/W/F, A-Fib on warfarin, HTN, and anemia   Who presents with chest pain for 1 day and abdominal pain for 3 months.  Regarding chest pain, this was a new complaint today.  The patient went to HD, and during her session, developed a sharp, severe, constant pain under her right breast associated with shortness of breath and nausea.  She finished her treatment, then the HD nurse administered O2 and nitroglycerin, which caused her pain to subside, and recommended she go to the ER.   Subjective: Feels constipated-- sharp pain in rectum     Assessment & Plan:   Principal Problem:   Diverticulitis of large intestine with perforation Active Problems:   ESRD on hemodialysis (HCC)   Paroxysmal atrial fibrillation (HCC)   Essential hypertension   Anemia in other chronic diseases classified elsewhere   Chest pain with moderate risk for cardiac etiology   Diverticulitis with perforation.  -Continue antibiotic therapy with metronidazole and ciprofloxacin. -tolerating soft diet -d/c in AM  ESRD on HD M/W/F,.  -Patient clinically continue euvolemic.  -HD per nephrology-- due tomm  HTN.  -Patient's BP currently borderline hypotensive. Hold losartan. -Continue Amiodarone 200 mg daily -Decrease Coreg to 25 mg  BID -Lasix 80 mg daily  Paroxysmal atrial fibrillation.  -Currently in NSR -Currently supratherapeutic on Coumadin most likely secondary to interaction between fluoroquinolones and warfarin. Continue to monitor closely. No signs of overt bleeding.Marland Kitchen  Recent Labs Lab 06/27/16 0433 06/28/16 0428 06/29/16 0248 06/30/16 0508 07/01/16 0505  INR 2.69 3.05 3.69 3.52 3.04  -Per cardiology no further workup required.   Anemia of chronic Renal disease.   -No signs of obstructive bleed  Recent Labs Lab 06/27/16 0444  06/28/16 0428 06/29/16 0248 06/30/16 0508 07/01/16 0823  HGB 10.3* 10.6* 10.1* 10.3* 10.2*  -Stable    DVT prophylaxis: Coumadin per pharmacy Code Status: Full Family Communication: None Disposition Plan: home in AM   Consultants:  Dr.THOMPSON,BURKE  Trauma surgery Dr Skeet Latch. Cardiology [facility]    Cultures 12/6 MRSA by PCR negative    Antimicrobials: Ciprofloxacin 12/6>> Metronidazole 12/6>>    Objective: Vitals:   07/01/16 1100 07/01/16 1130 07/01/16 1145 07/01/16 1200  BP: (!) 144/32 (!) 123/35 (!) 114/35 (!) 116/39  Pulse: 62 (!) 57 (!) 57 61  Resp:    16  Temp:    98.3 F (36.8 C)  TempSrc:    Oral  SpO2:    95%  Weight:    108.2 kg (238 lb 8.6 oz)  Height:        Intake/Output Summary (Last 24 hours) at 07/01/16 1441 Last data filed at 07/01/16 1353  Gross per 24 hour  Intake              582 ml  Output             2000 ml  Net            -1418 ml   Filed Weights   07/01/16 0506 07/01/16 0805 07/01/16 1200  Weight: 110.4 kg (243 lb 4.8 oz) 110.9 kg (244 lb 7.8 oz) 108.2 kg (238 lb 8.6 oz)    Examination:  General: A/O 4, negative N/V, negative abdominal pain.No acute respiratory distress Lungs: Clear to auscultation bilaterally without wheezes or crackles Cardiovascular: Regular rate and rhythm without murmur  gallop or rub normal S1 and S2 Abdomen: negative abdominal pain, nondistended, positive soft, bowel sounds, no rebound, no ascites, no appreciable mass Extremities: No significant cyanosis, clubbing, or edema bilateral lower extremities    CBC:  Recent Labs Lab 06/25/16 1604  06/27/16 0444 06/28/16 0428 06/29/16 0248 06/30/16 0508 07/01/16 0823  WBC 7.0  < > 5.8 6.9 6.0 5.6 5.5  NEUTROABS 3.8  --   --   --  2.9 2.6  --   HGB 11.2*  < > 10.3* 10.6* 10.1* 10.3* 10.2*  HCT 35.6*  < > 32.6* 33.5* 32.2* 33.3* 32.1*  MCV 89.9  < > 89.6 91.0 91.7 91.5 89.7  PLT 206  < > 242 226 237 252 271  <  > = values in this interval not displayed. Basic Metabolic Panel:  Recent Labs Lab 06/26/16 1618 06/27/16 0433 06/28/16 0428 06/29/16 0248 06/30/16 0508 07/01/16 0815  NA 134* 134* 136 132* 131* 131*  K 3.6 3.3* 3.9 4.2 4.1 4.1  CL 94* 93* 96* 95* 94* 95*  CO2 28 30 28 28 24 26   GLUCOSE 102* 92 85 96 87 104*  BUN 27* 32* 15 22* 30* 41*  CREATININE 5.19* 6.20* 5.10* 7.42* 9.77* 11.53*  CALCIUM 8.5* 8.6* 8.8* 8.2* 7.9* 7.9*  PHOS 3.9 4.8*  --   --   --  6.0*   GFR: Estimated Creatinine Clearance: 5.4 mL/min (by C-G formula based on SCr of 11.53 mg/dL (H)). Liver Function Tests:  Recent Labs Lab 06/25/16 1604 06/26/16 0321 06/26/16 1618 06/27/16 0433 07/01/16 0815  AST 18 18  --   --   --   ALT 15 12*  --   --   --   ALKPHOS 113 105  --   --   --   BILITOT 0.6 0.8  --   --   --   PROT 7.9 7.0  --   --   --   ALBUMIN 3.6 3.1* 3.4* 3.2* 3.2*   No results for input(s): LIPASE, AMYLASE in the last 168 hours. No results for input(s): AMMONIA in the last 168 hours. Coagulation Profile:  Recent Labs Lab 06/27/16 0433 06/28/16 0428 06/29/16 0248 06/30/16 0508 07/01/16 0505  INR 2.69 3.05 3.69 3.52 3.04   Cardiac Enzymes:  Recent Labs Lab 06/25/16 2141 06/26/16 0321 06/26/16 0915  TROPONINI <0.03 <0.03 <0.03   BNP (last 3 results) No results for input(s): PROBNP in the last 8760 hours. HbA1C: No results for input(s): HGBA1C in the last 72 hours. CBG: No results for input(s): GLUCAP in the last 168 hours. Lipid Profile: No results for input(s): CHOL, HDL, LDLCALC, TRIG, CHOLHDL, LDLDIRECT in the last 72 hours. Thyroid Function Tests: No results for input(s): TSH, T4TOTAL, FREET4, T3FREE, THYROIDAB in the last 72 hours. Anemia Panel: No results for input(s): VITAMINB12, FOLATE, FERRITIN, TIBC, IRON, RETICCTPCT in the last 72 hours. Urine analysis:    Component Value Date/Time   COLORURINE AMBER (A) 06/30/2016 0503   APPEARANCEUR HAZY (A) 06/30/2016 0503     LABSPEC 1.020 06/30/2016 0503   PHURINE 5.0 06/30/2016 0503   GLUCOSEU NEGATIVE 06/30/2016 0503   HGBUR NEGATIVE 06/30/2016 0503   BILIRUBINUR SMALL (A) 06/30/2016 0503   KETONESUR NEGATIVE 06/30/2016 0503   PROTEINUR 30 (A) 06/30/2016 0503   UROBILINOGEN 0.2 06/30/2012 2201   NITRITE NEGATIVE 06/30/2016 0503   LEUKOCYTESUR SMALL (A) 06/30/2016 0503    ) Recent Results (from the past 240 hour(s))  MRSA PCR Screening  Status: None   Collection Time: 06/25/16 10:25 PM  Result Value Ref Range Status   MRSA by PCR NEGATIVE NEGATIVE Final    Comment:        The GeneXpert MRSA Assay (FDA approved for NASAL specimens only), is one component of a comprehensive MRSA colonization surveillance program. It is not intended to diagnose MRSA infection nor to guide or monitor treatment for MRSA infections.          Radiology Studies: No results found.      Scheduled Meds: . amiodarone  200 mg Oral Daily  . bisacodyl  10 mg Rectal Once  . carvedilol  25 mg Oral BID WC  . cinacalcet  30 mg Oral Q supper  . ciprofloxacin  400 mg Intravenous Q24H  . ciprofloxacin  500 mg Oral QHS  . darbepoetin (ARANESP) injection - DIALYSIS  60 mcg Intravenous Q Mon-HD  . docusate sodium  200 mg Oral BID  . furosemide  80 mg Oral q morning - 10a  . metroNIDAZOLE  500 mg Oral Q8H  . polyethylene glycol  17 g Oral Daily  . sevelamer carbonate  1,600 mg Oral BID WC  . sodium chloride flush  3 mL Intravenous Q12H  . warfarin  1 mg Oral ONCE-1800  . Warfarin - Pharmacist Dosing Inpatient   Does not apply q1800   Continuous Infusions:   LOS: 5 days    Time spent: 40 minutes    [place], DO [facility] Pager [phone]  If 7PM-7AM, please contact night-coverage [email or web address] Password [facility] 07/01/2016, 2:41 PM

## 2016-07-01 NOTE — Progress Notes (Signed)
[facility] Progress Note     Subjective: Pt is complaining of "gas" and generalized abdomin pain different than the pain she presented with. She states she has not had a BM for 4 days and thinks that's why she is having pain. Mild nausea she believes is associated with the PO antibiotics. No other complaints. No fevers or vomiting.   Objective: Vital signs in last 24 hours: Temp:  [97.3 F (36.3 C)-98.4 F (36.9 C)] 98.4 F (36.9 C) (12/12 0805) Pulse Rate:  [53-68] 60 (12/12 1000) Resp:  [16] 16 (12/12 0805) BP: (111-163)/(25-70) 135/28 (12/12 1000) SpO2:  [94 %-97 %] 94 % (12/12 0805) Weight:  [243 lb 4.8 oz (110.4 kg)-244 lb 7.8 oz (110.9 kg)] 244 lb 7.8 oz (110.9 kg) (12/12 0805) Last BM Date: 06/27/16  Intake/Output from previous day: 12/11 0701 - 12/12 0700 In: 1196 [P.O.:960] Out: -  Intake/Output this shift: No intake/output data recorded.  PE: General: pleasant, WD/WN female who is laying in bed in NAD Heart: regular, rate, and rhythm. Mild systolic murmur appreciated, normal S1 and S2,  Lungs: CTA, Respiratory effort nonlabored Abd: soft, ND, +BS, mild TTP to LLQ Skin: warm and dry with no rashes noted Neuro: CM grossly 2-12 intact, normal speech  Lab Results:   Recent Labs  06/30/16 0508 07/01/16 0823  WBC 5.6 5.5  HGB 10.3* 10.2*  HCT 33.3* 32.1*  PLT 252 271   BMET  Recent Labs  06/30/16 0508 07/01/16 0815  NA 131* 131*  K 4.1 4.1  CL 94* 95*  CO2 24 26  GLUCOSE 87 104*  BUN 30* 41*  CREATININE 9.77* 11.53*  CALCIUM 7.9* 7.9*   PT/INR  Recent Labs  06/30/16 0508 07/01/16 0505  LABPROT 36.1* 32.2*  INR 3.52 3.04   CMP     Component Value Date/Time   NA 131 (L) 07/01/2016 0815   K 4.1 07/01/2016 0815   CL 95 (L) 07/01/2016 0815   CO2 26 07/01/2016 0815   GLUCOSE 104 (H) 07/01/2016 0815   BUN 41 (H) 07/01/2016 0815   CREATININE 11.53 (H) 07/01/2016 0815   CALCIUM 7.9 (L) 07/01/2016 0815   PROT 7.0 06/26/2016 0321    ALBUMIN 3.2 (L) 07/01/2016 0815   AST 18 06/26/2016 0321   ALT 12 (L) 06/26/2016 0321   ALKPHOS 105 06/26/2016 0321   BILITOT 0.8 06/26/2016 0321   GFRNONAA 3 (L) 07/01/2016 0815   GFRAA 3 (L) 07/01/2016 0815   Lipase  No results found for: LIPASE     Studies/Results: No results found.  Anti-infectives: Anti-infectives    Start     Dose/Rate Route Frequency Ordered Stop   06/26/16 2200  ciprofloxacin (CIPRO) IVPB 400 mg     400 mg 200 mL/hr over 60 Minutes Intravenous Every 24 hours 06/26/16 0035     06/25/16 2200  ciprofloxacin (CIPRO) tablet 500 mg  Status:  Discontinued     500 mg Oral Every 24 hours 06/25/16 2146 06/26/16 0030   06/25/16 2200  metroNIDAZOLE (FLAGYL) tablet 500 mg     500 mg Oral Every 8 hours 06/25/16 2146 07/02/16 2159       Assessment/Plan  Diverticulitis with micro perforation - IV cipro 12/6>> and PO flagyl 12/6>> - pt's pain has improved without pain meds, afebrile, WBC normal, tolerating soft diet well  Chest pain-resolved, pt seen by cardiology History of atrial fibrillation- on warfarin, amiodarone and Coreg.  Hypertension-on losartan, furosemide. ESRD on hemodialysis- MWF HD, nephrology following  FEN: soft diet VTE: SCD's and warfarin  Plan: continue soft diet, continue IV cipro and PO flagyl, ambulate, repeat CT scan if symptoms worsen. Pt is afebrile and no leukocytosis. Will address constipation with primary. Pt is clear from a surgical standpoint to go home with PO antibiotics.      LOS: 5 days    Kalman Drape , [facility] 07/01/2016, 11:03 AM Pager: [phone] Consults: [phone] Mon-Fri 7:00 am-4:30 pm Sat-Sun 7:00 am-11:30 am

## 2016-07-01 NOTE — Progress Notes (Signed)
[facility] Progress Note   Subjective: no BM, constipated, "gas pains" in abd / mid chest, nauseous  Vitals:   07/01/16 0915 07/01/16 0930 07/01/16 0945 07/01/16 1000  BP: (!) 140/41 (!) 142/53 (!) 120/25 (!) 135/28  Pulse: (!) 57 (!) 57 (!) 57 60  Resp:      Temp:      TempSrc:      SpO2:      Weight:      Height:        Inpatient medications: . amiodarone  200 mg Oral Daily  . carvedilol  25 mg Oral BID WC  . cinacalcet  30 mg Oral Q supper  . ciprofloxacin  400 mg Intravenous Q24H  . darbepoetin (ARANESP) injection - DIALYSIS  60 mcg Intravenous Q Mon-HD  . docusate sodium  200 mg Oral BID  . furosemide  80 mg Oral q morning - 10a  . metroNIDAZOLE  500 mg Oral Q8H  . senna-docusate  1 tablet Oral BID  . sevelamer carbonate  1,600 mg Oral BID WC  . sodium chloride flush  3 mL Intravenous Q12H  . Warfarin - Pharmacist Dosing Inpatient   Does not apply q1800    acetaminophen **OR** acetaminophen, alum & mag hydroxide-simeth, HYDROcodone-acetaminophen, ondansetron **OR** ondansetron (ZOFRAN) IV, sevelamer carbonate  Exam: Alert, no distress NO jvd Chest clear bilat RRR no mrg Abd obese soft ntnd Ext no edema R RC AVF   Dialysis: [facility] M[facility]  3h 18min  111.5kg  2/2 bath  Hep 2000 Hect 3 ug M-50 q 2, last 12/6 Hb 11.1, pth 394  Ca 8.8  P 4.8  renvela 2 ac, sensipar 30/d      Assessment: 1  Acute diverticulitis - on po abx cipro/flagyl 2  ESRD HD mwf 3  Atypical CP - trops neg, no further w/u per cards 4  HTN cont coreg/ losart/ lasix 5  Volume is at dry wt 6  Afib on coumadin 7  MBD cont meds  Plan - HD Gretta Arab MD [place] pager [phone]   07/01/2016, 10:24 AM    Recent Labs Lab 06/26/16 1618 06/27/16 0433  06/29/16 0248 06/30/16 0508 07/01/16 0815  NA 134* 134*  < > 132* 131* 131*  K 3.6 3.3*  < > 4.2 4.1 4.1  CL 94* 93*  < > 95* 94* 95*  CO2 28 30  < > 28 24 26   GLUCOSE 102* 92  < > 96 87 104*   BUN 27* 32*  < > 22* 30* 41*  CREATININE 5.19* 6.20*  < > 7.42* 9.77* 11.53*  CALCIUM 8.5* 8.6*  < > 8.2* 7.9* 7.9*  PHOS 3.9 4.8*  --   --   --  6.0*  < > = values in this interval not displayed.  Recent Labs Lab 06/25/16 1604 06/26/16 0321 06/26/16 1618 06/27/16 0433 07/01/16 0815  AST 18 18  --   --   --   ALT 15 12*  --   --   --   ALKPHOS 113 105  --   --   --   BILITOT 0.6 0.8  --   --   --   PROT 7.9 7.0  --   --   --   ALBUMIN 3.6 3.1* 3.4* 3.2* 3.2*    Recent Labs Lab 06/25/16 1604  06/29/16 0248 06/30/16 0508 07/01/16 0823  WBC 7.0  < > 6.0 5.6 5.5  NEUTROABS 3.8  --  2.9 2.6  --   HGB 11.2*  < > 10.1* 10.3* 10.2*  HCT 35.6*  < > 32.2* 33.3* 32.1*  MCV 89.9  < > 91.7 91.5 89.7  PLT 206  < > 237 252 271  < > = values in this interval not displayed. Iron/TIBC/Ferritin/ %Sat No results found for: IRON, TIBC, FERRITIN, IRONPCTSAT

## 2016-07-01 NOTE — Progress Notes (Signed)
Patient refused bed alarm. Will continue to monitor patient. 

## 2016-07-01 NOTE — Progress Notes (Signed)
ANTICOAGULATION CONSULT NOTE - Follow Up Consult  Pharmacy Consult for Coumadin Indication: atrial fibrillation  Patient Measurements: Height: 5\' 5"  (165.1 cm) Weight: 238 lb 8.6 oz (108.2 kg) IBW/kg (Calculated) : 57  Vital Signs: Temp: 98.3 F (36.8 C) (12/12 1200) Temp Source: Oral (12/12 1200) BP: 116/39 (12/12 1200) Pulse Rate: 61 (12/12 1200)  Labs:  Recent Labs  06/29/16 0248 06/30/16 0508 07/01/16 0505 07/01/16 0815 07/01/16 0823  HGB 10.1* 10.3*  --   --  10.2*  HCT 32.2* 33.3*  --   --  32.1*  PLT 237 252  --   --  271  LABPROT 37.5* 36.1* 32.2*  --   --   INR 3.69 3.52 3.04  --   --   CREATININE 7.42* 9.77*  --  11.53*  --     Estimated Creatinine Clearance: 5.4 mL/min (by C-G formula based on SCr of 11.53 mg/dL (H)).  Assessment:  70 yo female on coumadin PTA for h/o Afib. Severe drug/lab interaction with Flagyl. INR just slightly above goal (2-3).  Will give low dose of Coumadin tonight.    PTA dose coumadin 5 mg MTThSS, 7.5mg  W/Fri  Goal of Therapy:  INR 2-3 Monitor platelets by anticoagulation protocol: Yes   Plan:  Coumadin 1 mg PO x 1 tonight Will need adjustment of Coumadin dose at discharge.  Previous PTA dose will be too much with concomitant Flagyl. Hard to predict "right" dose at this point.  Likely will need INR recheck soon after discharge.  Of note, surgery ok with pt discharging on PO antibiotics.  Will change Cipro to PO today.  [employer], Pharm.D., BCPS Clinical Pharmacist Pager [phone] 07/01/2016 1:58 PM

## 2016-07-02 LAB — CBC
HEMATOCRIT: 37.2 % (ref 36.0–46.0)
HEMOGLOBIN: 11.8 g/dL — AB (ref 12.0–15.0)
MCH: 29.1 pg (ref 26.0–34.0)
MCHC: 31.7 g/dL (ref 30.0–36.0)
MCV: 91.9 fL (ref 78.0–100.0)
Platelets: 212 10*3/uL (ref 150–400)
RBC: 4.05 MIL/uL (ref 3.87–5.11)
RDW: 17.2 % — ABNORMAL HIGH (ref 11.5–15.5)
WBC: 7.8 10*3/uL (ref 4.0–10.5)

## 2016-07-02 LAB — PROTIME-INR
INR: 2.37
Prothrombin Time: 26.4 seconds — ABNORMAL HIGH (ref 11.4–15.2)

## 2016-07-02 MED ORDER — HEPARIN SODIUM (PORCINE) 1000 UNIT/ML DIALYSIS: 2000.0000 [IU] | INTRAMUSCULAR | Status: DC | PRN

## 2016-07-02 MED ORDER — SODIUM CHLORIDE 0.9 % IV SOLN: 100.0000 mL | INTRAVENOUS | Status: DC | PRN

## 2016-07-02 MED ORDER — HEPARIN SODIUM (PORCINE) 1000 UNIT/ML DIALYSIS: 1000.0000 [IU] | INTRAMUSCULAR | Status: DC | PRN

## 2016-07-02 MED ORDER — LIDOCAINE-PRILOCAINE 2.5-2.5 % EX CREA: 1.0000 "application " | TOPICAL_CREAM | CUTANEOUS | Status: DC | PRN

## 2016-07-02 MED ORDER — HEPARIN SODIUM (PORCINE) 1000 UNIT/ML DIALYSIS: 2000.0000 [IU] | Freq: Once | INTRAMUSCULAR | Status: DC

## 2016-07-02 MED ORDER — ALTEPLASE 2 MG IJ SOLR: 2.0000 mg | Freq: Once | INTRAMUSCULAR | Status: DC | PRN

## 2016-07-02 MED ORDER — LIDOCAINE HCL (PF) 1 % IJ SOLN: 5.0000 mL | INTRAMUSCULAR | Status: DC | PRN

## 2016-07-02 MED ORDER — WARFARIN SODIUM 2.5 MG PO TABS
2.5000 mg | ORAL_TABLET | Freq: Once | ORAL | Status: AC
  Administered 2016-07-02: 2.5 mg via ORAL
  Filled 2016-07-02: qty 1

## 2016-07-02 MED ORDER — PENTAFLUOROPROP-TETRAFLUOROETH EX AERO: 1.0000 "application " | INHALATION_SPRAY | CUTANEOUS | Status: DC | PRN

## 2016-07-02 NOTE — Progress Notes (Signed)
PROGRESS NOTE    Jane [name]  JJH:[id number] DOB: [birth year] DOA: 06/25/2016 PCP: Delia   Brief Narrative:   70 y.o.BF PMHx ESRD on HD M/W/F, A-Fib on warfarin, HTN, and anemia   Who presents with chest pain for 1 day and abdominal pain for 3 months.  Regarding chest pain, this was a new complaint today.  The patient went to HD, and during her session, developed a sharp, severe, constant pain under her right breast associated with shortness of breath and nausea.  She finished her treatment, then the HD nurse administered O2 and nitroglycerin, which caused her pain to subside, and recommended she go to the ER.   Subjective: No pain currently on dialysis Has pain after BM yesterday     Assessment & Plan:   Principal Problem:   Diverticulitis of large intestine with perforation Active Problems:   ESRD on hemodialysis (HCC)   Paroxysmal atrial fibrillation (HCC)   Essential hypertension   Anemia in other chronic diseases classified elsewhere   Chest pain with moderate risk for cardiac etiology   Diverticulitis with perforation.  -Continue antibiotic therapy with metronidazole and ciprofloxacin-- ? Due to nausea-- change PO abx to augmentin? -moved back to liquid diet by surgery  ESRD on HD M/W/F,.  -Patient clinically continue euvolemic.  -HD per nephrology  HTN.  -Patient's BP currently borderline hypotensive. Hold losartan. -Continue Amiodarone 200 mg daily -Decrease Coreg to 25 mg  BID -Lasix 80 mg daily  Paroxysmal atrial fibrillation.  -Currently in NSR -Currently supratherapeutic on Coumadin most likely secondary to interaction between fluoroquinolones and warfarin. Continue to monitor closely. No signs of overt bleeding.Marland Kitchen  Recent Labs Lab 06/28/16 0428 06/29/16 0248 06/30/16 0508 07/01/16 0505 07/02/16 0347  INR 3.05 3.69 3.52 3.04 2.37  -Per cardiology no further workup required.   Anemia of chronic Renal disease.   -No signs of  obstructive bleed  Recent Labs Lab 06/27/16 0444 06/28/16 0428 06/29/16 0248 06/30/16 0508 07/01/16 0823  HGB 10.3* 10.6* 10.1* 10.3* 10.2*  -Stable    DVT prophylaxis: Coumadin per pharmacy Code Status: Full Family Communication: None Disposition Plan: home?   Consultants:  Dr.THOMPSON,BURKE  Trauma surgery Dr Skeet Latch. Cardiology [facility]    Cultures 12/6 MRSA by PCR negative    Antimicrobials: Ciprofloxacin 12/6>> Metronidazole 12/6>>    Objective: Vitals:   07/02/16 1000 07/02/16 1030 07/02/16 1100 07/02/16 1105  BP: 134/75 125/63 125/63 (!) (P) 123/56  Pulse: 73 74 68 (P) 69  Resp:    (P) 18  Temp:    (P) 98.1 F (36.7 C)  TempSrc:    (P) Oral  SpO2:    (P) 100%  Weight:      Height:        Intake/Output Summary (Last 24 hours) at 07/02/16 1155 Last data filed at 07/01/16 2325  Gross per 24 hour  Intake              902 ml  Output             2275 ml  Net            -1373 ml   Filed Weights   07/01/16 1200 07/02/16 0437 07/02/16 0715  Weight: 108.2 kg (238 lb 8.6 oz) 108.4 kg (238 lb 14.4 oz) 108.9 kg (240 lb 1.3 oz)    Examination:  General: A/O 4 Lungs: Clear to auscultation bilaterally without wheezes or crackles Cardiovascular: Regular rate and rhythm without murmur gallop or  rub normal S1 and S2 Abdomen: negative abdominal pain, nondistended, positive soft, bowel sounds, no rebound, no ascites, no appreciable mass Extremities: No significant cyanosis, clubbing, or edema bilateral lower extremities    CBC:  Recent Labs Lab 06/25/16 1604  06/27/16 0444 06/28/16 0428 06/29/16 0248 06/30/16 0508 07/01/16 0823  WBC 7.0  < > 5.8 6.9 6.0 5.6 5.5  NEUTROABS 3.8  --   --   --  2.9 2.6  --   HGB 11.2*  < > 10.3* 10.6* 10.1* 10.3* 10.2*  HCT 35.6*  < > 32.6* 33.5* 32.2* 33.3* 32.1*  MCV 89.9  < > 89.6 91.0 91.7 91.5 89.7  PLT 206  < > 242 226 237 252 271  < > = values in this interval not  displayed. Basic Metabolic Panel:  Recent Labs Lab 06/26/16 1618 06/27/16 0433 06/28/16 0428 06/29/16 0248 06/30/16 0508 07/01/16 0815  NA 134* 134* 136 132* 131* 131*  K 3.6 3.3* 3.9 4.2 4.1 4.1  CL 94* 93* 96* 95* 94* 95*  CO2 28 30 28 28 24 26   GLUCOSE 102* 92 85 96 87 104*  BUN 27* 32* 15 22* 30* 41*  CREATININE 5.19* 6.20* 5.10* 7.42* 9.77* 11.53*  CALCIUM 8.5* 8.6* 8.8* 8.2* 7.9* 7.9*  PHOS 3.9 4.8*  --   --   --  6.0*   GFR: Estimated Creatinine Clearance: 5.4 mL/min (by C-G formula based on SCr of 11.53 mg/dL (H)). Liver Function Tests:  Recent Labs Lab 06/25/16 1604 06/26/16 0321 06/26/16 1618 06/27/16 0433 07/01/16 0815  AST 18 18  --   --   --   ALT 15 12*  --   --   --   ALKPHOS 113 105  --   --   --   BILITOT 0.6 0.8  --   --   --   PROT 7.9 7.0  --   --   --   ALBUMIN 3.6 3.1* 3.4* 3.2* 3.2*   No results for input(s): LIPASE, AMYLASE in the last 168 hours. No results for input(s): AMMONIA in the last 168 hours. Coagulation Profile:  Recent Labs Lab 06/28/16 0428 06/29/16 0248 06/30/16 0508 07/01/16 0505 07/02/16 0347  INR 3.05 3.69 3.52 3.04 2.37   Cardiac Enzymes:  Recent Labs Lab 06/25/16 2141 06/26/16 0321 06/26/16 0915  TROPONINI <0.03 <0.03 <0.03   BNP (last 3 results) No results for input(s): PROBNP in the last 8760 hours. HbA1C: No results for input(s): HGBA1C in the last 72 hours. CBG: No results for input(s): GLUCAP in the last 168 hours. Lipid Profile: No results for input(s): CHOL, HDL, LDLCALC, TRIG, CHOLHDL, LDLDIRECT in the last 72 hours. Thyroid Function Tests: No results for input(s): TSH, T4TOTAL, FREET4, T3FREE, THYROIDAB in the last 72 hours. Anemia Panel: No results for input(s): VITAMINB12, FOLATE, FERRITIN, TIBC, IRON, RETICCTPCT in the last 72 hours. Urine analysis:    Component Value Date/Time   COLORURINE AMBER (A) 06/30/2016 0503   APPEARANCEUR HAZY (A) 06/30/2016 0503   LABSPEC 1.020 06/30/2016 0503    PHURINE 5.0 06/30/2016 0503   GLUCOSEU NEGATIVE 06/30/2016 0503   HGBUR NEGATIVE 06/30/2016 0503   BILIRUBINUR SMALL (A) 06/30/2016 0503   KETONESUR NEGATIVE 06/30/2016 0503   PROTEINUR 30 (A) 06/30/2016 0503   UROBILINOGEN 0.2 06/30/2012 2201   NITRITE NEGATIVE 06/30/2016 0503   LEUKOCYTESUR SMALL (A) 06/30/2016 0503    ) Recent Results (from the past 240 hour(s))  MRSA PCR Screening     Status: None  Collection Time: 06/25/16 10:25 PM  Result Value Ref Range Status   MRSA by PCR NEGATIVE NEGATIVE Final    Comment:        The GeneXpert MRSA Assay (FDA approved for NASAL specimens only), is one component of a comprehensive MRSA colonization surveillance program. It is not intended to diagnose MRSA infection nor to guide or monitor treatment for MRSA infections.          Radiology Studies: No results found.      Scheduled Meds: . amiodarone  200 mg Oral Daily  . carvedilol  25 mg Oral BID WC  . cinacalcet  30 mg Oral Q supper  . ciprofloxacin  500 mg Oral QHS  . darbepoetin (ARANESP) injection - DIALYSIS  60 mcg Intravenous Q Mon-HD  . docusate sodium  200 mg Oral BID  . furosemide  80 mg Oral q morning - 10a  . metroNIDAZOLE  500 mg Oral Q8H  . sevelamer carbonate  1,600 mg Oral BID WC  . sodium chloride flush  3 mL Intravenous Q12H  . Warfarin - Pharmacist Dosing Inpatient   Does not apply q1800   Continuous Infusions:   LOS: 6 days    Time spent: 25 minutes    Abrams, DO [facility] Pager [phone]  If 7PM-7AM, please contact night-coverage [email or web address] Password [email or web address] 07/02/2016, 11:55 AM

## 2016-07-02 NOTE — Procedures (Signed)
On HD, under dry wt.  Had another bad night, "BP dropped", abd pains again, took laxative and had BM but caused abd to hurt as well.  PO antibiotics "tearing me up", provokes nausea and sometimes vomiting. Stable from renal standpoint.    I was present at this dialysis session, have reviewed the session itself and made  appropriate changes Kelly Splinter MD [place] pager [phone]   07/02/2016, 8:06 AM

## 2016-07-02 NOTE — Progress Notes (Signed)
ANTICOAGULATION CONSULT NOTE - Follow Up Consult  Pharmacy Consult for Coumadin Indication: atrial fibrillation  Patient Measurements: Height: 5\' 5"  (165.1 cm) Weight: 240 lb 1.3 oz (108.9 kg) IBW/kg (Calculated) : 57  Vital Signs: Temp: 98.1 F (36.7 C) (12/13 1316) Temp Source: Oral (12/13 1316) BP: 135/48 (12/13 1316) Pulse Rate: 65 (12/13 1316)  Labs:  Recent Labs  06/30/16 0508 07/01/16 0505 07/01/16 0815 07/01/16 0823 07/02/16 0347 07/02/16 1316  HGB 10.3*  --   --  10.2*  --  11.8*  HCT 33.3*  --   --  32.1*  --  37.2  PLT 252  --   --  271  --  212  LABPROT 36.1* 32.2*  --   --  26.4*  --   INR 3.52 3.04  --   --  2.37  --   CREATININE 9.77*  --  11.53*  --   --   --     Estimated Creatinine Clearance: 5.4 mL/min (by C-G formula based on SCr of 11.53 mg/dL (H)).  Assessment:  70 yo female on coumadin PTA for h/o Afib. Severe drug/lab interaction with Flagyl. INR has reduced to between goal of 2-3.  Continue low dose of Coumadin due to drug interaction.  PTA dose coumadin 5 mg MTThSS, 7.5mg  W/Fri  Goal of Therapy:  INR 2-3 Monitor platelets by anticoagulation protocol: Yes   Plan:  Coumadin 2.5 mg PO x 1 tonight Will need adjustment of Coumadin dose at discharge.  Previous PTA dose will be too much with concomitant Flagyl. Hard to predict "right" dose at this point.  Likely will need INR recheck soon after discharge.  [employer], Pharm.D., BCPS Clinical Pharmacist Pager [phone] 07/02/2016 1:35 PM

## 2016-07-02 NOTE — Progress Notes (Addendum)
[facility] Progress Note     Subjective: Pt states her pain returned after a BM yesterday. She states it is the same pain she presented with and required pain medication this morning. Still complaining of mild nausea.  Objective: Vital signs in last 24 hours: Temp:  [98.2 F (36.8 C)-98.6 F (37 C)] 98.2 F (36.8 C) (12/13 0715) Pulse Rate:  [56-72] 64 (12/13 0830) Resp:  [15-20] 18 (12/13 0715) BP: (94-144)/(25-59) 129/45 (12/13 0830) SpO2:  [94 %-98 %] 98 % (12/13 0715) Weight:  [238 lb 8.6 oz (108.2 kg)-240 lb 1.3 oz (108.9 kg)] 240 lb 1.3 oz (108.9 kg) (12/13 0715) Last BM Date: 07/01/16 (Small BM with lots of Flatus)  Intake/Output from previous day: 12/12 0701 - 12/13 0700 In: 902 [P.O.:902] Out: 2275 [Urine:275] Intake/Output this shift: No intake/output data recorded.  PE: General: pleasant, WD/WN femalewho is laying in bed in NAD Heart: regular, rate, and rhythm. Mild systolic murmur appreciated, normal S1 and S2,  Abd: soft, ND, +BS, TTP to LLQ Skin: warm and dry with no rashes noted Neuro: normal speech  Lab Results:   Recent Labs  06/30/16 0508 07/01/16 0823  WBC 5.6 5.5  HGB 10.3* 10.2*  HCT 33.3* 32.1*  PLT 252 271   BMET  Recent Labs  06/30/16 0508 07/01/16 0815  NA 131* 131*  K 4.1 4.1  CL 94* 95*  CO2 24 26  GLUCOSE 87 104*  BUN 30* 41*  CREATININE 9.77* 11.53*  CALCIUM 7.9* 7.9*   PT/INR  Recent Labs  07/01/16 0505 07/02/16 0347  LABPROT 32.2* 26.4*  INR 3.04 2.37   CMP     Component Value Date/Time   NA 131 (L) 07/01/2016 0815   K 4.1 07/01/2016 0815   CL 95 (L) 07/01/2016 0815   CO2 26 07/01/2016 0815   GLUCOSE 104 (H) 07/01/2016 0815   BUN 41 (H) 07/01/2016 0815   CREATININE 11.53 (H) 07/01/2016 0815   CALCIUM 7.9 (L) 07/01/2016 0815   PROT 7.0 06/26/2016 0321   ALBUMIN 3.2 (L) 07/01/2016 0815   AST 18 06/26/2016 0321   ALT 12 (L) 06/26/2016 0321   ALKPHOS 105 06/26/2016 0321   BILITOT 0.8  06/26/2016 0321   GFRNONAA 3 (L) 07/01/2016 0815   GFRAA 3 (L) 07/01/2016 0815   Lipase  No results found for: LIPASE     Studies/Results: No results found.  Anti-infectives: Anti-infectives    Start     Dose/Rate Route Frequency Ordered Stop   07/01/16 2200  ciprofloxacin (CIPRO) tablet 500 mg     500 mg Oral Daily at bedtime 07/01/16 1400     06/26/16 2200  ciprofloxacin (CIPRO) IVPB 400 mg  Status:  Discontinued     400 mg 200 mL/hr over 60 Minutes Intravenous Every 24 hours 06/26/16 0035 07/01/16 2111   06/25/16 2200  ciprofloxacin (CIPRO) tablet 500 mg  Status:  Discontinued     500 mg Oral Every 24 hours 06/25/16 2146 06/26/16 0030   06/25/16 2200  metroNIDAZOLE (FLAGYL) tablet 500 mg     500 mg Oral Every 8 hours 06/25/16 2146 07/02/16 2159       Assessment/Plan  Diverticulitis with micro perforation - IV cipro 12/6>> and PO flagyl 12/6>> - pt's pain had improved without pain meds but has returned after BM yesterday, afebrile, WBC normal, will decreased diet to clears and continue PO antibiotics.   Chest pain-resolved, ptseen by cardiology History of atrial fibrillation- on warfarin, amiodarone and Coreg.  Hypertension-onlosartan, furosemide. ESRD on hemodialysis- MWF HD, nephrology following  WUG:QBVQXI, 1/2 bt of mag citrate if needed for constipation VTE:SCD's and warfarin  Plan: Pt has return of pain after BM yesterday. Will decreased diet to clears and continue PO cipro and PO flagyl, ambulate, may need a repeat CT scan in the setting of leukocytosis, fever and pain not improved on clears. Pt is afebrile and no leukocytosis at this time. Pending CBC.    LOS: 6 days    Kalman Drape , [facility] 07/02/2016, 9:10 AM Pager: [phone] Consults: [phone] Mon-Fri 7:00 am-4:30 pm Sat-Sun 7:00 am-11:30 am   Patient with continued pain.  Had to back off on her diet.  Will get repeat CT tomorrow.  More tender in the  RLQ.  Kathryne Eriksson. Dahlia Bailiff, MD, [place] [phone] [facility]

## 2016-07-03 ENCOUNTER — Inpatient Hospital Stay (HOSPITAL_COMMUNITY): Payer: Medicare Other

## 2016-07-03 LAB — PROTIME-INR
INR: 2.2
PROTHROMBIN TIME: 24.8 s — AB (ref 11.4–15.2)

## 2016-07-03 MED ORDER — WARFARIN SODIUM 5 MG PO TABS
5.0000 mg | ORAL_TABLET | Freq: Once | ORAL | Status: AC
  Administered 2016-07-03: 5 mg via ORAL
  Filled 2016-07-03: qty 1

## 2016-07-03 MED ORDER — PIPERACILLIN-TAZOBACTAM 3.375 G IVPB
3.3750 g | Freq: Two times a day (BID) | INTRAVENOUS | Status: DC
  Administered 2016-07-03 – 2016-07-08 (×11): 3.375 g via INTRAVENOUS
  Filled 2016-07-03 (×12): qty 50

## 2016-07-03 MED ORDER — IOPAMIDOL (ISOVUE-300) INJECTION 61%
INTRAVENOUS | Status: AC
  Administered 2016-07-03: 100 mL
  Filled 2016-07-03: qty 100

## 2016-07-03 NOTE — Progress Notes (Signed)
PROGRESS NOTE  Jane [name] MGQ:[id number] DOB: 1946-05-24 DOA: 06/25/2016 PCP: [facility]   LOS: 7 days   Brief Narrative: 70 y.o.BF PMHx ESRD on HD M/W/F, A-Fib on warfarin, HTN, and anemiaWho presents with chest pain for 1 day and abdominal pain for 3 months. Regarding chest pain, this was a new complaint today. The patient went to HD, and during her session, developed a sharp, severe, constant pain under her right breast associated with shortness of breath and nausea. She finished her treatment, then the HD nurse administered O2 and nitroglycerin, which caused her pain to subside, and recommended she go to the ER.  Assessment & Plan: Principal Problem:   Diverticulitis of large intestine with perforation Active Problems:   ESRD on hemodialysis (HCC)   Paroxysmal atrial fibrillation (HCC)   Essential hypertension   Anemia in other chronic diseases classified elsewhere   Chest pain with moderate risk for cardiac etiology  Diverticulitis with perforation - repeat CT scan today with "increased amount of gas seen in adjacent peritoneal fat compared to prior exam, consistent with micro perforation and possible developing absces" - she was initially on Cipro flagyl but now on Zosyn per surgery  - continue NPO  ESRD on HD - Patient clinically continue euvolemic. - HD per nephrology  HTN - Patient's BP currently borderline hypotensive. Hold losartan. - Continue Amiodarone 200 mg daily - continue Coreg - hold Lasix today   Paroxysmal atrial fibrillation - Currently in NSR - continue Coumadin  Anemia of chronic Renal disease - No signs of obstructive bleed - Stable   DVT prophylaxis: Coumadin Code Status: Full Family Communication: no family bedside Disposition Plan: home when ready   Consultants:   General surgery  Cardiology   Nephrology   Procedures:   None   Antimicrobials:  Zosyn 12/14 >>   Subjective: - no chest pain, shortness  of breath, no abdominal pain, nausea or vomiting.   Objective: Vitals:   07/02/16 1235 07/02/16 1316 07/02/16 2133 07/03/16 0519  BP: (!) 103/59 (!) 135/48 112/89 (!) 116/43  Pulse:  65 61 62  Resp:  18    Temp:  98.1 F (36.7 C) 98.4 F (36.9 C) 97.5 F (36.4 C)  TempSrc:  Oral Oral Oral  SpO2:  98% 98% 96%  Weight:    108.7 kg (239 lb 11.2 oz)  Height:        Intake/Output Summary (Last 24 hours) at 07/03/16 1317 Last data filed at 07/03/16 0900  Gross per 24 hour  Intake              245 ml  Output                0 ml  Net              245 ml   Filed Weights   07/02/16 0437 07/02/16 0715 07/03/16 0519  Weight: 108.4 kg (238 lb 14.4 oz) 108.9 kg (240 lb 1.3 oz) 108.7 kg (239 lb 11.2 oz)    Examination: Constitutional: NAD Vitals:   07/02/16 1235 07/02/16 1316 07/02/16 2133 07/03/16 0519  BP: (!) 103/59 (!) 135/48 112/89 (!) 116/43  Pulse:  65 61 62  Resp:  18    Temp:  98.1 F (36.7 C) 98.4 F (36.9 C) 97.5 F (36.4 C)  TempSrc:  Oral Oral Oral  SpO2:  98% 98% 96%  Weight:    108.7 kg (239 lb 11.2 oz)  Height:  Respiratory: clear to auscultation bilaterally, no wheezing, no crackles.  Cardiovascular: Regular rate and rhythm, no murmurs / rubs / gallops. No LE edema. Abdomen: + tenderness LLQ. Bowel sounds positive.  Musculoskeletal: no clubbing / cyanosis. Skin: no rashes, lesions, ulcers. No induration Neurologic: non focal    Data Reviewed: I have personally reviewed following labs and imaging studies  CBC:  Recent Labs Lab 06/28/16 0428 06/29/16 0248 06/30/16 0508 07/01/16 0823 07/02/16 1316  WBC 6.9 6.0 5.6 5.5 7.8  NEUTROABS  --  2.9 2.6  --   --   HGB 10.6* 10.1* 10.3* 10.2* 11.8*  HCT 33.5* 32.2* 33.3* 32.1* 37.2  MCV 91.0 91.7 91.5 89.7 91.9  PLT 226 237 252 271 400   Basic Metabolic Panel:  Recent Labs Lab 06/26/16 1618 06/27/16 0433 06/28/16 0428 06/29/16 0248 06/30/16 0508 07/01/16 0815  NA 134* 134* 136 132* 131*  131*  K 3.6 3.3* 3.9 4.2 4.1 4.1  CL 94* 93* 96* 95* 94* 95*  CO2 28 30 28 28 24 26   GLUCOSE 102* 92 85 96 87 104*  BUN 27* 32* 15 22* 30* 41*  CREATININE 5.19* 6.20* 5.10* 7.42* 9.77* 11.53*  CALCIUM 8.5* 8.6* 8.8* 8.2* 7.9* 7.9*  PHOS 3.9 4.8*  --   --   --  6.0*   GFR: Estimated Creatinine Clearance: 5.4 mL/min (by C-G formula based on SCr of 11.53 mg/dL (H)). Liver Function Tests:  Recent Labs Lab 06/26/16 1618 06/27/16 0433 07/01/16 0815  ALBUMIN 3.4* 3.2* 3.2*   No results for input(s): LIPASE, AMYLASE in the last 168 hours. No results for input(s): AMMONIA in the last 168 hours. Coagulation Profile:  Recent Labs Lab 06/29/16 0248 06/30/16 0508 07/01/16 0505 07/02/16 0347 07/03/16 0236  INR 3.69 3.52 3.04 2.37 2.20   Cardiac Enzymes: No results for input(s): CKTOTAL, CKMB, CKMBINDEX, TROPONINI in the last 168 hours. BNP (last 3 results) No results for input(s): PROBNP in the last 8760 hours. HbA1C: No results for input(s): HGBA1C in the last 72 hours. CBG: No results for input(s): GLUCAP in the last 168 hours. Lipid Profile: No results for input(s): CHOL, HDL, LDLCALC, TRIG, CHOLHDL, LDLDIRECT in the last 72 hours. Thyroid Function Tests: No results for input(s): TSH, T4TOTAL, FREET4, T3FREE, THYROIDAB in the last 72 hours. Anemia Panel: No results for input(s): VITAMINB12, FOLATE, FERRITIN, TIBC, IRON, RETICCTPCT in the last 72 hours. Urine analysis:    Component Value Date/Time   COLORURINE AMBER (A) 06/30/2016 0503   APPEARANCEUR HAZY (A) 06/30/2016 0503   LABSPEC 1.020 06/30/2016 0503   PHURINE 5.0 06/30/2016 0503   GLUCOSEU NEGATIVE 06/30/2016 0503   HGBUR NEGATIVE 06/30/2016 0503   BILIRUBINUR SMALL (A) 06/30/2016 0503   KETONESUR NEGATIVE 06/30/2016 0503   PROTEINUR 30 (A) 06/30/2016 0503   UROBILINOGEN 0.2 06/30/2012 2201   NITRITE NEGATIVE 06/30/2016 0503   LEUKOCYTESUR SMALL (A) 06/30/2016 0503   Sepsis Labs: Invalid input(s):  PROCALCITONIN, LACTICIDVEN  Recent Results (from the past 240 hour(s))  MRSA PCR Screening     Status: None   Collection Time: 06/25/16 10:25 PM  Result Value Ref Range Status   MRSA by PCR NEGATIVE NEGATIVE Final    Comment:        The GeneXpert MRSA Assay (FDA approved for NASAL specimens only), is one component of a comprehensive MRSA colonization surveillance program. It is not intended to diagnose MRSA infection nor to guide or monitor treatment for MRSA infections.       Radiology Studies:  Ct Abdomen Pelvis W Contrast  Result Date: 07/03/2016 CLINICAL DATA:  Left lower quadrant abdominal pain for several months. EXAM: CT ABDOMEN AND PELVIS WITH CONTRAST TECHNIQUE: Multidetector CT imaging of the abdomen and pelvis was performed using the standard protocol following bolus administration of intravenous contrast. CONTRAST:  175mL ISOVUE-300 IOPAMIDOL (ISOVUE-300) INJECTION 61% COMPARISON:  CT scan of June 25, 2016. FINDINGS: Lower chest: No acute abnormality. Hepatobiliary: No focal liver abnormality is seen. No gallstones, gallbladder wall thickening, or biliary dilatation. Pancreas: Unremarkable. No pancreatic ductal dilatation or surrounding inflammatory changes. Spleen: Normal in size without focal abnormality. Adrenals/Urinary Tract: Adrenal glands appear normal. Bilateral renal atrophy is noted with stable bilateral renal cysts. No hydronephrosis or renal obstruction is noted. Urinary bladder is decompressed. Stomach/Bowel: The appendix appears normal. There is no evidence of bowel obstruction. Sigmoid diverticulosis is noted with wall thickening and surrounding inflammation suggesting sigmoid diverticulitis. There does appear to an increased amount of gas in the peritoneal fat adjacent to sigmoid colon concerning for micro perforation related to inflammation. Vascular/Lymphatic: Aortic atherosclerosis. No enlarged abdominal or pelvic lymph nodes. Reproductive: Uterus and  bilateral adnexa are unremarkable. Other: No abdominal wall hernia or abnormality. No abdominopelvic ascites. Musculoskeletal: Status post surgical posterior fusion of L4-5 with bilateral intrapedicular screw placement. Multilevel degenerative disc disease is noted in the lower lumbar spine. IMPRESSION: Findings consistent with acute sigmoid diverticulitis, with increased amount of gas seen in adjacent peritoneal fat compared to prior exam, consistent with micro perforation and possible developing abscess. Aortic atherosclerosis. Electronically Signed   By: Marijo Conception, M.D.   On: 07/03/2016 08:51     Scheduled Meds: . amiodarone  200 mg Oral Daily  . carvedilol  25 mg Oral BID WC  . cinacalcet  30 mg Oral Q supper  . darbepoetin (ARANESP) injection - DIALYSIS  60 mcg Intravenous Q Mon-HD  . docusate sodium  200 mg Oral BID  . piperacillin-tazobactam (ZOSYN)  IV  3.375 g Intravenous Q12H  . sodium chloride flush  3 mL Intravenous Q12H  . warfarin  5 mg Oral ONCE-1800  . Warfarin - Pharmacist Dosing Inpatient   Does not apply q1800   Continuous Infusions:  Marzetta Board, MD, PhD [facility] Pager [phone]  If 7PM-7AM, please contact night-coverage [email or web address] Password [email or web address] 07/03/2016, 1:17 PM

## 2016-07-03 NOTE — Progress Notes (Signed)
Pharmacy Antibiotic Note  Jane [name] is a 70 y.o. female admitted on 06/25/2016 with abdominal pain.  CT scan 12/6 showed diverticulitis of large intestine with perforation.  Pt received Cipro/Flagyl x 8 days.  Repeat CT scan today shows continued divericulitis, micro perforation and possible developing abscess.   Pharmacy has been consulted to change abx to Zosyn.  Pt has ESRD with dialysis MWF.  Plan: Zosyn 3.375g IV q12h (4 hour infusion). No further dose adjustments anticipated.  Rx will sign off.  Height: 5\' 5"  (165.1 cm) Weight: 239 lb 11.2 oz (108.7 kg) IBW/kg (Calculated) : 57  Temp (24hrs), Avg:98 F (36.7 C), Min:97.5 F (36.4 C), Max:98.4 F (36.9 C)   Recent Labs Lab 06/27/16 0433  06/28/16 0428 06/29/16 0248 06/30/16 0508 07/01/16 0815 07/01/16 0823 07/02/16 1316  WBC  --   < > 6.9 6.0 5.6  --  5.5 7.8  CREATININE 6.20*  --  5.10* 7.42* 9.77* 11.53*  --   --   < > = values in this interval not displayed.  Estimated Creatinine Clearance: 5.4 mL/min (by C-G formula based on SCr of 11.53 mg/dL (H)).    Allergies  Allergen Reactions  . Tape Other (See Comments)    PLASTIC TAPE TEARS OFF THE SKIN AND BRUISES IT TERRIBLY!!    Antimicrobials this admission: Cipro 12/6 >> 12/14 Flagyl 12/6 >> 12/13 Zosyn 12/14 >>  Dose adjustments this admission: n/a  Microbiology results: None   Thank you for allowing pharmacy to be a part of this patient's care.  [employer], Pharm.D., BCPS Clinical Pharmacist Pager [phone] 07/03/2016 9:59 AM

## 2016-07-03 NOTE — Progress Notes (Signed)
Subjective: She is still having pain, worse with palpation and and with BM's.  She does not appear toxic and does not have peritonitis, pain is mostly LLQ.  No issues with PO's.    Objective: Vital signs in last 24 hours: Temp:  [97.5 F (36.4 C)-98.4 F (36.9 C)] 97.5 F (36.4 C) (12/14 0519) Pulse Rate:  [61-74] 62 (12/14 0519) Resp:  [18] 18 (12/13 1316) BP: (103-135)/(29-89) 116/43 (12/14 0519) SpO2:  [96 %-100 %] 96 % (12/14 0519) Weight:  [108.7 kg (239 lb 11.2 oz)] 108.7 kg (239 lb 11.2 oz) (12/14 0519) Last BM Date: 07/02/16 (pt stated 2 BM's today.) 240 PO recorded Afebrile, VSS No labs today, CBC normal yeserday INR 2.20 CT abdomen and pelvis with contrast this AM:  Stomach/Bowel: The appendix appears normal. There is no evidence of bowel obstruction. Sigmoid diverticulosis is noted with wall thickening and surrounding inflammation suggesting sigmoid diverticulitis. There does appear to an increased amount of gas in the peritoneal fat adjacent to sigmoid colon concerning for micro perforation related to inflammation. Intake/Output from previous day: 12/13 0701 - 12/14 0700 In: 245 [P.O.:245] Out: -  Intake/Output this shift: No intake/output data recorded.  General appearance: alert, cooperative and no distress GI: soft, tender LLQ  Lab Results:   Recent Labs  07/01/16 0823 07/02/16 1316  WBC 5.5 7.8  HGB 10.2* 11.8*  HCT 32.1* 37.2  PLT 271 212    BMET  Recent Labs  07/01/16 0815  NA 131*  K 4.1  CL 95*  CO2 26  GLUCOSE 104*  BUN 41*  CREATININE 11.53*  CALCIUM 7.9*   PT/INR  Recent Labs  07/02/16 0347 07/03/16 0236  LABPROT 26.4* 24.8*  INR 2.37 2.20     Recent Labs Lab 06/26/16 1618 06/27/16 0433 07/01/16 0815  ALBUMIN 3.4* 3.2* 3.2*     Lipase  No results found for: LIPASE   Studies/Results: Ct Abdomen Pelvis W Contrast  Result Date: 07/03/2016 CLINICAL DATA:  Left lower quadrant abdominal pain for several months.  EXAM: CT ABDOMEN AND PELVIS WITH CONTRAST TECHNIQUE: Multidetector CT imaging of the abdomen and pelvis was performed using the standard protocol following bolus administration of intravenous contrast. CONTRAST:  196mL ISOVUE-300 IOPAMIDOL (ISOVUE-300) INJECTION 61% COMPARISON:  CT scan of June 25, 2016. FINDINGS: Lower chest: No acute abnormality. Hepatobiliary: No focal liver abnormality is seen. No gallstones, gallbladder wall thickening, or biliary dilatation. Pancreas: Unremarkable. No pancreatic ductal dilatation or surrounding inflammatory changes. Spleen: Normal in size without focal abnormality. Adrenals/Urinary Tract: Adrenal glands appear normal. Bilateral renal atrophy is noted with stable bilateral renal cysts. No hydronephrosis or renal obstruction is noted. Urinary bladder is decompressed. Stomach/Bowel: The appendix appears normal. There is no evidence of bowel obstruction. Sigmoid diverticulosis is noted with wall thickening and surrounding inflammation suggesting sigmoid diverticulitis. There does appear to an increased amount of gas in the peritoneal fat adjacent to sigmoid colon concerning for micro perforation related to inflammation. Vascular/Lymphatic: Aortic atherosclerosis. No enlarged abdominal or pelvic lymph nodes. Reproductive: Uterus and bilateral adnexa are unremarkable. Other: No abdominal wall hernia or abnormality. No abdominopelvic ascites. Musculoskeletal: Status post surgical posterior fusion of L4-5 with bilateral intrapedicular screw placement. Multilevel degenerative disc disease is noted in the lower lumbar spine. IMPRESSION: Findings consistent with acute sigmoid diverticulitis, with increased amount of gas seen in adjacent peritoneal fat compared to prior exam, consistent with micro perforation and possible developing abscess. Aortic atherosclerosis. Electronically Signed   By: Marijo Conception, M.D.  On: 07/03/2016 08:51    Medications: . amiodarone  200 mg Oral  Daily  . carvedilol  25 mg Oral BID WC  . cinacalcet  30 mg Oral Q supper  . ciprofloxacin  500 mg Oral QHS  . darbepoetin (ARANESP) injection - DIALYSIS  60 mcg Intravenous Q Mon-HD  . docusate sodium  200 mg Oral BID  . furosemide  80 mg Oral q morning - 10a  . sevelamer carbonate  1,600 mg Oral BID WC  . sodium chloride flush  3 mL Intravenous Q12H  . Warfarin - Pharmacist Dosing Inpatient   Does not apply q1800    Assessment/Plan Diverticulitis with micro perforation Chest pain-resolved, ptseen by cardiology History of atrial fibrillation- on warfarin, amiodarone and Coreg.  Hypertension-onlosartan, furosemide. ESRD on hemodialysis- MWF HD, nephrology following AV:WPVXY  IV x 5 days, day 3 oral Cipro, day 9 PO flagyl IAX:KPVVZ liquid Diet/ 1/2 bt of mag citrate if needed for constipation VTE:SCD's and warfarin  INR 2.20   PLan:  Switch her to Zosyn.  She is afraid she will get Vaginal yeast infection with antibiotics.      LOS: 7 days    Jane [name] 07/03/2016 [phone]

## 2016-07-03 NOTE — Care Management Note (Addendum)
Case Management Note  Patient Details  Name: Jane [name] MRN: [id number] Date of Birth: 1945-08-12  Subjective/Objective:  Pt presented for lower quadrant abdominal pain. CT abdomen revealed sigmoid colon concerning for microperforation related to inflammation. Pt initiated on IV Cipro and changed to po. Pt was on Liquids and advanced to soft with increasing nausea. Pt switched back to Liquids. Pt is from home with daughter. PTA independent- no needs for DME. HD -MWF schedule.  Plan will be for home once stable.                    Action/Plan: CM did speak with pt in regards to [employer]- Pt states daughter is at home and provides assistance if needed. CM will continue to monitor for additional needs.   Expected Discharge Date:                  Expected Discharge Plan:  Home/Self Care  In-House Referral:  NA  Discharge planning Services  NA  Post Acute Care Choice:  NA Choice offered to:  NA  DME Arranged:  N/A DME Agency:  NA  HH Arranged:  NA HH Agency:  NA  Status of Service:  Completed, signed off  If discussed at [place] of Stay Meetings, dates discussed:  07-01-16, 07-03-16, 07-08-16, 07-10-16  Additional Comments: 07-10-16 [address], RN,BSN [phone] Per Surgery plan for Sigmoid colectomy with temporary ostomy before d/c. CM will continue to monitor.  Bethena Roys, RN 07/03/2016, 3:11 PM

## 2016-07-03 NOTE — Care Management Important Message (Signed)
Important Message  Patient Details  Name: Jane [name] MRN: [id number] Date of Birth: 03/26/1946   Medicare Important Message Given:  Yes    Nathen May 07/03/2016, 9:53 AM

## 2016-07-03 NOTE — Progress Notes (Signed)
ANTICOAGULATION CONSULT NOTE - Follow Up Consult  Pharmacy Consult for Coumadin Indication: atrial fibrillation  Patient Measurements: Height: 5\' 5"  (165.1 cm) Weight: 239 lb 11.2 oz (108.7 kg) IBW/kg (Calculated) : 57  Vital Signs: Temp: 97.5 F (36.4 C) (12/14 0519) Temp Source: Oral (12/14 0519) BP: 116/43 (12/14 0519) Pulse Rate: 62 (12/14 0519)  Labs:  Recent Labs  07/01/16 0505 07/01/16 0815 07/01/16 0823 07/02/16 0347 07/02/16 1316 07/03/16 0236  HGB  --   --  10.2*  --  11.8*  --   HCT  --   --  32.1*  --  37.2  --   PLT  --   --  271  --  212  --   LABPROT 32.2*  --   --  26.4*  --  24.8*  INR 3.04  --   --  2.37  --  2.20  CREATININE  --  11.53*  --   --   --   --     Estimated Creatinine Clearance: 5.4 mL/min (by C-G formula based on SCr of 11.53 mg/dL (H)).  Assessment:  70 yo female on coumadin PTA for h/o Afib. INR trending down on reduced Coumadin doses in response to drug interaction with Flagyl.  Flagyl discontinued 12/13.  Will attempt restart of home regimen.  PTA dose coumadin 5 mg MTThSS, 7.5mg  W/Fri  Goal of Therapy:  INR 2-3 Monitor platelets by anticoagulation protocol: Yes   Plan:  Coumadin 5 mg PO x 1 tonight  [employer], Pharm.D., BCPS Clinical Pharmacist Pager [phone] 07/03/2016 9:59 AM

## 2016-07-03 NOTE — Progress Notes (Signed)
[facility] Progress Note   Subjective:  "I'm still here..." Up in chair. Says she only has abdominal pain she attempts to have BM. No N & V today.  Repeat CT of abdomen today.    Objective Vitals:   07/02/16 1235 07/02/16 1316 07/02/16 2133 07/03/16 0519  BP: (!) 103/59 (!) 135/48 112/89 (!) 116/43  Pulse:  65 61 62  Resp:  18    Temp:  98.1 F (36.7 C) 98.4 F (36.9 C) 97.5 F (36.4 C)  TempSrc:  Oral Oral Oral  SpO2:  98% 98% 96%  Weight:    108.7 kg (239 lb 11.2 oz)  Height:       Physical Exam General: Alert, oriented, very pleasant. Heart: RRR T7,D2, 1/6 systolic M Lungs: BBS CTAB A/P Abdomen: soft, non-tender, non-distended.  Extremities: Trace pre tib edema RLE, no edema LLE Dialysis Access: RFA AVF+ bruit   Additional Objective Labs: Basic Metabolic Panel:  Recent Labs Lab 06/26/16 1618 06/27/16 0433  06/29/16 0248 06/30/16 0508 07/01/16 0815  NA 134* 134*  < > 132* 131* 131*  K 3.6 3.3*  < > 4.2 4.1 4.1  CL 94* 93*  < > 95* 94* 95*  CO2 28 30  < > 28 24 26   GLUCOSE 102* 92  < > 96 87 104*  BUN 27* 32*  < > 22* 30* 41*  CREATININE 5.19* 6.20*  < > 7.42* 9.77* 11.53*  CALCIUM 8.5* 8.6*  < > 8.2* 7.9* 7.9*  PHOS 3.9 4.8*  --   --   --  6.0*  < > = values in this interval not displayed. Liver Function Tests:  Recent Labs Lab 06/26/16 1618 06/27/16 0433 07/01/16 0815  ALBUMIN 3.4* 3.2* 3.2*   No results for input(s): LIPASE, AMYLASE in the last 168 hours. CBC:  Recent Labs Lab 06/28/16 0428 06/29/16 0248 06/30/16 0508 07/01/16 0823 07/02/16 1316  WBC 6.9 6.0 5.6 5.5 7.8  NEUTROABS  --  2.9 2.6  --   --   HGB 10.6* 10.1* 10.3* 10.2* 11.8*  HCT 33.5* 32.2* 33.3* 32.1* 37.2  MCV 91.0 91.7 91.5 89.7 91.9  PLT 226 237 252 271 212   Blood Culture    Component Value Date/Time   SDES URINE, CLEAN CATCH 06/30/2012 2201   SPECREQUEST NONE 06/30/2012 2201   CULT KLEBSIELLA PNEUMONIAE 06/30/2012 2201   REPTSTATUS 07/02/2012  FINAL 06/30/2012 2201    Cardiac Enzymes: No results for input(s): CKTOTAL, CKMB, CKMBINDEX, TROPONINI in the last 168 hours. CBG: No results for input(s): GLUCAP in the last 168 hours. Iron Studies: No results for input(s): IRON, TIBC, TRANSFERRIN, FERRITIN in the last 72 hours. @lablastinr3 @ Studies/Results: Ct Abdomen Pelvis W Contrast  Result Date: 07/03/2016 CLINICAL DATA:  Left lower quadrant abdominal pain for several months. EXAM: CT ABDOMEN AND PELVIS WITH CONTRAST TECHNIQUE: Multidetector CT imaging of the abdomen and pelvis was performed using the standard protocol following bolus administration of intravenous contrast. CONTRAST:  110mL ISOVUE-300 IOPAMIDOL (ISOVUE-300) INJECTION 61% COMPARISON:  CT scan of June 25, 2016. FINDINGS: Lower chest: No acute abnormality. Hepatobiliary: No focal liver abnormality is seen. No gallstones, gallbladder wall thickening, or biliary dilatation. Pancreas: Unremarkable. No pancreatic ductal dilatation or surrounding inflammatory changes. Spleen: Normal in size without focal abnormality. Adrenals/Urinary Tract: Adrenal glands appear normal. Bilateral renal atrophy is noted with stable bilateral renal cysts. No hydronephrosis or renal obstruction is noted. Urinary bladder is decompressed. Stomach/Bowel: The appendix appears normal. There is no evidence of  bowel obstruction. Sigmoid diverticulosis is noted with wall thickening and surrounding inflammation suggesting sigmoid diverticulitis. There does appear to an increased amount of gas in the peritoneal fat adjacent to sigmoid colon concerning for micro perforation related to inflammation. Vascular/Lymphatic: Aortic atherosclerosis. No enlarged abdominal or pelvic lymph nodes. Reproductive: Uterus and bilateral adnexa are unremarkable. Other: No abdominal wall hernia or abnormality. No abdominopelvic ascites. Musculoskeletal: Status post surgical posterior fusion of L4-5 with bilateral intrapedicular screw  placement. Multilevel degenerative disc disease is noted in the lower lumbar spine. IMPRESSION: Findings consistent with acute sigmoid diverticulitis, with increased amount of gas seen in adjacent peritoneal fat compared to prior exam, consistent with micro perforation and possible developing abscess. Aortic atherosclerosis. Electronically Signed   By: Marijo Conception, M.D.   On: 07/03/2016 08:51   Medications:  . amiodarone  200 mg Oral Daily  . carvedilol  25 mg Oral BID WC  . cinacalcet  30 mg Oral Q supper  . darbepoetin (ARANESP) injection - DIALYSIS  60 mcg Intravenous Q Mon-HD  . docusate sodium  200 mg Oral BID  . piperacillin-tazobactam (ZOSYN)  IV  3.375 g Intravenous Q12H  . sevelamer carbonate  1,600 mg Oral BID WC  . sodium chloride flush  3 mL Intravenous Q12H  . warfarin  5 mg Oral ONCE-1800  . Warfarin - Pharmacist Dosing Inpatient   Does not apply q1800   Dialysis Orders: [facility] MWF 3 hours 45 minutes 450/800 111.5 kg  2.0 K/2.0 Ca  Heparin: 2000 units IV q tx Hectoral 3 mcg IV q tx (Last PTH 394 06/10/16) Mircera 50 mcg IV q 2 weeks (last dose 06/25/16 Last HGB 11.1 06/18/16)  BMD meds:  Renvela 800 mg 2 tabs PO TID with meals Sensipar 30 mg PO q day Last BMD labs: Ca 8.5 C Ca 8.8 Phos 4.8 PTH 394 06/10/16)  Assessment/Plan: 1  Acute diverticulitis - Repeat CT of ab shows acute diverticulitis micro perforation and possible developing abscess. Antibiotics changed to zosyn today. Surgery following.  2  ESRD HD mwf. For HD tomorrow. K+ 4.1. Renal profile prior to HD tomorrow. Tight heparin.   3  Atypical CP - trops neg, no further w/u per cards 4  HTN. BP controlled.  cont coreg/ losart/ lasix 5  Volume: HD 07/01/16 Pre wt 108.9 kg Net UF not documented, Post wt not documented, Wt today 108.7 kg. Is not eating, is 2.8 kg below OP EDW. UFG 1-1.5 liters tomorrow.  6  Afib on coumadin INR 2.2 today.  7  MBD: binder on hold D/T being NPO. Cont VDRA.  8. Nutrition:  Albumin 3.2. NPO except ice chips. 9. Anemia: HGB 11.8 07/02/16. Last ESA dose Aranesp 60 mcg IV 07/01/16. Follow HGB.   Rita H. Brown NP-C 07/03/2016, 10:58 AM  [facility] [phone]  Pt seen, examined and agree w A/P as above. Kelly Splinter MD [employer] pager [phone]   07/03/2016, 1:49 PM

## 2016-07-04 LAB — CBC
HCT: 32.4 % — ABNORMAL LOW (ref 36.0–46.0)
HEMOGLOBIN: 10.3 g/dL — AB (ref 12.0–15.0)
MCH: 29.1 pg (ref 26.0–34.0)
MCHC: 31.8 g/dL (ref 30.0–36.0)
MCV: 91.5 fL (ref 78.0–100.0)
Platelets: 238 10*3/uL (ref 150–400)
RBC: 3.54 MIL/uL — ABNORMAL LOW (ref 3.87–5.11)
RDW: 17.2 % — ABNORMAL HIGH (ref 11.5–15.5)
WBC: 6.5 10*3/uL (ref 4.0–10.5)

## 2016-07-04 LAB — BASIC METABOLIC PANEL
Anion gap: 10 (ref 5–15)
BUN: 15 mg/dL (ref 6–20)
CHLORIDE: 92 mmol/L — AB (ref 101–111)
CO2: 28 mmol/L (ref 22–32)
CREATININE: 7.4 mg/dL — AB (ref 0.44–1.00)
Calcium: 7.8 mg/dL — ABNORMAL LOW (ref 8.9–10.3)
GFR calc Af Amer: 6 mL/min — ABNORMAL LOW (ref >60)
GFR calc non Af Amer: 5 mL/min — ABNORMAL LOW (ref >60)
Glucose, Bld: 65 mg/dL (ref 65–99)
Potassium: 4.1 mmol/L (ref 3.5–5.1)
Sodium: 130 mmol/L — ABNORMAL LOW (ref 135–145)

## 2016-07-04 LAB — PROTIME-INR
INR: 2.32
Prothrombin Time: 25.8 seconds — ABNORMAL HIGH (ref 11.4–15.2)

## 2016-07-04 MED ORDER — WARFARIN SODIUM 7.5 MG PO TABS
7.5000 mg | ORAL_TABLET | Freq: Once | ORAL | Status: AC
  Administered 2016-07-04: 7.5 mg via ORAL
  Filled 2016-07-04: qty 1

## 2016-07-04 MED ORDER — GUAIFENESIN-DM 100-10 MG/5ML PO SYRP: 5.0000 mL | ORAL_SOLUTION | ORAL | Status: DC | PRN

## 2016-07-04 NOTE — Progress Notes (Signed)
Subjective: Complains of pain LLQ, I cannot tell if it is worse, but not better.  Day 2 Zosyn  Objective: Vital signs in last 24 hours: Temp:  [98.5 F (36.9 C)-98.7 F (37.1 C)] 98.5 F (36.9 C) (12/15 0500) Pulse Rate:  [56-62] 62 (12/15 0500) Resp:  [15-17] 15 (12/15 0500) BP: (100-114)/(40-43) 114/40 (12/15 0500) SpO2:  [94 %-95 %] 95 % (12/15 0500) Weight:  [109.2 kg (240 lb 12.8 oz)] 109.2 kg (240 lb 12.8 oz) (12/15 0500) Last BM Date: 07/03/16 210 Po Urine 25 BM when  Afebrile, VSS WBC remains normal   Intake/Output from previous day: 12/14 0701 - 12/15 0700 In: 310 [P.O.:210; IV Piggyback:100] Out: 25 [Urine:25] Intake/Output this shift: No intake/output data recorded.  General appearance: alert, cooperative and no distress GI: soft, still tender LLQ, no peritonitis  Lab Results:   Recent Labs  07/02/16 1316 07/04/16 0414  WBC 7.8 6.5  HGB 11.8* 10.3*  HCT 37.2 32.4*  PLT 212 238    BMET  Recent Labs  07/04/16 0414  NA 130*  K 4.1  CL 92*  CO2 28  GLUCOSE 65  BUN 15  CREATININE 7.40*  CALCIUM 7.8*   PT/INR  Recent Labs  07/03/16 0236 07/04/16 0414  LABPROT 24.8* 25.8*  INR 2.20 2.32     Recent Labs Lab 07/01/16 0815  ALBUMIN 3.2*     Lipase  No results found for: LIPASE   Studies/Results: Ct Abdomen Pelvis W Contrast  Result Date: 07/03/2016 CLINICAL DATA:  Left lower quadrant abdominal pain for several months. EXAM: CT ABDOMEN AND PELVIS WITH CONTRAST TECHNIQUE: Multidetector CT imaging of the abdomen and pelvis was performed using the standard protocol following bolus administration of intravenous contrast. CONTRAST:  166mL ISOVUE-300 IOPAMIDOL (ISOVUE-300) INJECTION 61% COMPARISON:  CT scan of June 25, 2016. FINDINGS: Lower chest: No acute abnormality. Hepatobiliary: No focal liver abnormality is seen. No gallstones, gallbladder wall thickening, or biliary dilatation. Pancreas: Unremarkable. No pancreatic ductal  dilatation or surrounding inflammatory changes. Spleen: Normal in size without focal abnormality. Adrenals/Urinary Tract: Adrenal glands appear normal. Bilateral renal atrophy is noted with stable bilateral renal cysts. No hydronephrosis or renal obstruction is noted. Urinary bladder is decompressed. Stomach/Bowel: The appendix appears normal. There is no evidence of bowel obstruction. Sigmoid diverticulosis is noted with wall thickening and surrounding inflammation suggesting sigmoid diverticulitis. There does appear to an increased amount of gas in the peritoneal fat adjacent to sigmoid colon concerning for micro perforation related to inflammation. Vascular/Lymphatic: Aortic atherosclerosis. No enlarged abdominal or pelvic lymph nodes. Reproductive: Uterus and bilateral adnexa are unremarkable. Other: No abdominal wall hernia or abnormality. No abdominopelvic ascites. Musculoskeletal: Status post surgical posterior fusion of L4-5 with bilateral intrapedicular screw placement. Multilevel degenerative disc disease is noted in the lower lumbar spine. IMPRESSION: Findings consistent with acute sigmoid diverticulitis, with increased amount of gas seen in adjacent peritoneal fat compared to prior exam, consistent with micro perforation and possible developing abscess. Aortic atherosclerosis. Electronically Signed   By: Marijo Conception, M.D.   On: 07/03/2016 08:51    Medications: . amiodarone  200 mg Oral Daily  . carvedilol  25 mg Oral BID WC  . cinacalcet  30 mg Oral Q supper  . darbepoetin (ARANESP) injection - DIALYSIS  60 mcg Intravenous Q Mon-HD  . docusate sodium  200 mg Oral BID  . piperacillin-tazobactam (ZOSYN)  IV  3.375 g Intravenous Q12H  . sodium chloride flush  3 mL Intravenous Q12H  . Warfarin -  Pharmacist Dosing Inpatient   Does not apply q1800     Assessment/Plan Diverticulitis with micro perforation Chest pain-resolved, ptseen by cardiology History of atrial fibrillation- on  warfarin, amiodarone and Coreg.  Hypertension-onlosartan, furosemide. ESRD on hemodialysis- MWF HD, nephrology following [id number]  IV x 5 days, day 3 oral Cipro, day 9 PO flagyl  - Pt on day 2 Zosyn FEN:NPO/NO IV fluids VTE:SCD's and warfarin  INR 2.32    Plan:  Day 2 Zosyn, will discuss adding Diflucan also.      LOS: 8 days    Elizabeth Haff 07/04/2016 [phone]

## 2016-07-04 NOTE — Progress Notes (Signed)
ANTICOAGULATION CONSULT NOTE - Follow Up Consult  Pharmacy Consult for Coumadin Indication: atrial fibrillation  Patient Measurements: Height: 5\' 5"  (165.1 cm) Weight: 240 lb 12.8 oz (109.2 kg) IBW/kg (Calculated) : 57  Vital Signs: Temp: 98.5 F (36.9 C) (12/15 0500) BP: 105/46 (12/15 0851) Pulse Rate: 62 (12/15 0500)  Labs:  Recent Labs  07/02/16 0347 07/02/16 1316 07/03/16 0236 07/04/16 0414  HGB  --  11.8*  --  10.3*  HCT  --  37.2  --  32.4*  PLT  --  212  --  238  LABPROT 26.4*  --  24.8* 25.8*  INR 2.37  --  2.20 2.32  CREATININE  --   --   --  7.40*    Estimated Creatinine Clearance: 8.5 mL/min (by C-G formula based on SCr of 7.4 mg/dL (H)).  Assessment:  70 yo female on coumadin PTA for h/o Afib. INR trending down on reduced Coumadin doses in response to drug interaction with Flagyl. Flagyl discontinued 12/13.   INR today remains therapeutic at 2.32. Hgb low stable at 10.3. Plt 238.   PTA dose: 5mg  daily except 7.5mg  on Wednesdays and Fridays per anti-coag clinic  Goal of Therapy:  INR 2-3 Monitor platelets by anticoagulation protocol: Yes   Plan:  Coumadin 7.5mg  x1 tonight per PTA dose Daily PT/INR Monitor CBC, s/s bleeding  Stephens November, PharmD Clinical Pharmacist 9:18 AM, 07/04/2016

## 2016-07-04 NOTE — Progress Notes (Signed)
PROGRESS NOTE  Jane [name] HCW:[id number] DOB: 1946-02-24 DOA: 06/25/2016 PCP: [facility]   LOS: 8 days   Brief Narrative: 70 y.o.BF PMHx ESRD on HD M/W/F, A-Fib on warfarin, HTN, and anemiaWho presents with chest pain for 1 day and abdominal pain for 3 months. Regarding chest pain, this was a new complaint today. The patient went to HD, and during her session, developed a sharp, severe, constant pain under her right breast associated with shortness of breath and nausea. She finished her treatment, then the HD nurse administered O2 and nitroglycerin, which caused her pain to subside, and recommended she go to the ER.  Assessment & Plan: Principal Problem:   Diverticulitis of large intestine with perforation Active Problems:   ESRD on hemodialysis (HCC)   Paroxysmal atrial fibrillation (HCC)   Essential hypertension   Anemia in other chronic diseases classified elsewhere   Chest pain with moderate risk for cardiac etiology  Diverticulitis with perforation - repeat CT scan 12/14 with "increased amount of gas seen in adjacent peritoneal fat compared to prior exam, consistent with micro perforation and possible developing absces" - she was initially on Cipro flagyl but now on Zosyn per surgery  - continue NPO, continue Zosyn  ESRD on HD - Patient clinically continue euvolemic. - HD per nephrology, to have HD today   HTN - Patient's BP currently borderline hypotensive. Hold losartan. - Continue Amiodarone 200 mg daily - continue Coreg  Paroxysmal atrial fibrillation - Currently in NSR - continue Coumadin  Anemia of chronic Renal disease - No signs of obstructive bleed - Stable   DVT prophylaxis: Coumadin Code Status: Full Family Communication: no family bedside Disposition Plan: home when ready   Consultants:   General surgery  Cardiology   Nephrology   Procedures:   None   Antimicrobials:  Zosyn 12/14 >>   Subjective: - no chest  pain, shortness of breath, no abdominal pain, nausea or vomiting.   Objective: Vitals:   07/04/16 1300 07/04/16 1307 07/04/16 1330 07/04/16 1400  BP: (!) 141/66 (!) 165/56 134/64 (!) (P) 124/46  Pulse: 64 60 60 (P) 61  Resp:      Temp: 97.7 F (36.5 C)     TempSrc: Oral     SpO2:      Weight:      Height:        Intake/Output Summary (Last 24 hours) at 07/04/16 1421 Last data filed at 07/04/16 0930  Gross per 24 hour  Intake              310 ml  Output               25 ml  Net              285 ml   Filed Weights   07/02/16 0715 07/03/16 0519 07/04/16 0500  Weight: 108.9 kg (240 lb 1.3 oz) 108.7 kg (239 lb 11.2 oz) 109.2 kg (240 lb 12.8 oz)    Examination: Constitutional: NAD Vitals:   07/04/16 1300 07/04/16 1307 07/04/16 1330 07/04/16 1400  BP: (!) 141/66 (!) 165/56 134/64 (!) (P) 124/46  Pulse: 64 60 60 (P) 61  Resp:      Temp: 97.7 F (36.5 C)     TempSrc: Oral     SpO2:      Weight:      Height:       Respiratory: clear to auscultation bilaterally, no wheezing, no crackles.  Cardiovascular: Regular rate and rhythm, no  murmurs / rubs / gallops. No LE edema. Abdomen: + tenderness LLQ. Bowel sounds positive.  Musculoskeletal: no clubbing / cyanosis. Skin: no rashes, lesions, ulcers. No induration Neurologic: non focal    Data Reviewed: I have personally reviewed following labs and imaging studies  CBC:  Recent Labs Lab 06/29/16 0248 06/30/16 0508 07/01/16 0823 07/02/16 1316 07/04/16 0414  WBC 6.0 5.6 5.5 7.8 6.5  NEUTROABS 2.9 2.6  --   --   --   HGB 10.1* 10.3* 10.2* 11.8* 10.3*  HCT 32.2* 33.3* 32.1* 37.2 32.4*  MCV 91.7 91.5 89.7 91.9 91.5  PLT 237 252 271 212 967   Basic Metabolic Panel:  Recent Labs Lab 06/28/16 0428 06/29/16 0248 06/30/16 0508 07/01/16 0815 07/04/16 0414  NA 136 132* 131* 131* 130*  K 3.9 4.2 4.1 4.1 4.1  CL 96* 95* 94* 95* 92*  CO2 28 28 24 26 28   GLUCOSE 85 96 87 104* 65  BUN 15 22* 30* 41* 15  CREATININE  5.10* 7.42* 9.77* 11.53* 7.40*  CALCIUM 8.8* 8.2* 7.9* 7.9* 7.8*  PHOS  --   --   --  6.0*  --    GFR: Estimated Creatinine Clearance: 8.5 mL/min (by C-G formula based on SCr of 7.4 mg/dL (H)). Liver Function Tests:  Recent Labs Lab 07/01/16 0815  ALBUMIN 3.2*   No results for input(s): LIPASE, AMYLASE in the last 168 hours. No results for input(s): AMMONIA in the last 168 hours. Coagulation Profile:  Recent Labs Lab 06/30/16 0508 07/01/16 0505 07/02/16 0347 07/03/16 0236 07/04/16 0414  INR 3.52 3.04 2.37 2.20 2.32   Cardiac Enzymes: No results for input(s): CKTOTAL, CKMB, CKMBINDEX, TROPONINI in the last 168 hours. BNP (last 3 results) No results for input(s): PROBNP in the last 8760 hours. HbA1C: No results for input(s): HGBA1C in the last 72 hours. CBG: No results for input(s): GLUCAP in the last 168 hours. Lipid Profile: No results for input(s): CHOL, HDL, LDLCALC, TRIG, CHOLHDL, LDLDIRECT in the last 72 hours. Thyroid Function Tests: No results for input(s): TSH, T4TOTAL, FREET4, T3FREE, THYROIDAB in the last 72 hours. Anemia Panel: No results for input(s): VITAMINB12, FOLATE, FERRITIN, TIBC, IRON, RETICCTPCT in the last 72 hours. Urine analysis:    Component Value Date/Time   COLORURINE AMBER (A) 06/30/2016 0503   APPEARANCEUR HAZY (A) 06/30/2016 0503   LABSPEC 1.020 06/30/2016 0503   PHURINE 5.0 06/30/2016 0503   GLUCOSEU NEGATIVE 06/30/2016 0503   HGBUR NEGATIVE 06/30/2016 0503   BILIRUBINUR SMALL (A) 06/30/2016 0503   KETONESUR NEGATIVE 06/30/2016 0503   PROTEINUR 30 (A) 06/30/2016 0503   UROBILINOGEN 0.2 06/30/2012 2201   NITRITE NEGATIVE 06/30/2016 0503   LEUKOCYTESUR SMALL (A) 06/30/2016 0503   Sepsis Labs: Invalid input(s): PROCALCITONIN, LACTICIDVEN  Recent Results (from the past 240 hour(s))  MRSA PCR Screening     Status: None   Collection Time: 06/25/16 10:25 PM  Result Value Ref Range Status   MRSA by PCR NEGATIVE NEGATIVE Final     Comment:        The GeneXpert MRSA Assay (FDA approved for NASAL specimens only), is one component of a comprehensive MRSA colonization surveillance program. It is not intended to diagnose MRSA infection nor to guide or monitor treatment for MRSA infections.       Radiology Studies: Ct Abdomen Pelvis W Contrast  Result Date: 07/03/2016 CLINICAL DATA:  Left lower quadrant abdominal pain for several months. EXAM: CT ABDOMEN AND PELVIS WITH CONTRAST TECHNIQUE: Multidetector CT imaging  of the abdomen and pelvis was performed using the standard protocol following bolus administration of intravenous contrast. CONTRAST:  158mL ISOVUE-300 IOPAMIDOL (ISOVUE-300) INJECTION 61% COMPARISON:  CT scan of June 25, 2016. FINDINGS: Lower chest: No acute abnormality. Hepatobiliary: No focal liver abnormality is seen. No gallstones, gallbladder wall thickening, or biliary dilatation. Pancreas: Unremarkable. No pancreatic ductal dilatation or surrounding inflammatory changes. Spleen: Normal in size without focal abnormality. Adrenals/Urinary Tract: Adrenal glands appear normal. Bilateral renal atrophy is noted with stable bilateral renal cysts. No hydronephrosis or renal obstruction is noted. Urinary bladder is decompressed. Stomach/Bowel: The appendix appears normal. There is no evidence of bowel obstruction. Sigmoid diverticulosis is noted with wall thickening and surrounding inflammation suggesting sigmoid diverticulitis. There does appear to an increased amount of gas in the peritoneal fat adjacent to sigmoid colon concerning for micro perforation related to inflammation. Vascular/Lymphatic: Aortic atherosclerosis. No enlarged abdominal or pelvic lymph nodes. Reproductive: Uterus and bilateral adnexa are unremarkable. Other: No abdominal wall hernia or abnormality. No abdominopelvic ascites. Musculoskeletal: Status post surgical posterior fusion of L4-5 with bilateral intrapedicular screw placement. Multilevel  degenerative disc disease is noted in the lower lumbar spine. IMPRESSION: Findings consistent with acute sigmoid diverticulitis, with increased amount of gas seen in adjacent peritoneal fat compared to prior exam, consistent with micro perforation and possible developing abscess. Aortic atherosclerosis. Electronically Signed   By: Marijo Conception, M.D.   On: 07/03/2016 08:51     Scheduled Meds: . amiodarone  200 mg Oral Daily  . carvedilol  25 mg Oral BID WC  . cinacalcet  30 mg Oral Q supper  . darbepoetin (ARANESP) injection - DIALYSIS  60 mcg Intravenous Q Mon-HD  . docusate sodium  200 mg Oral BID  . piperacillin-tazobactam (ZOSYN)  IV  3.375 g Intravenous Q12H  . sodium chloride flush  3 mL Intravenous Q12H  . warfarin  7.5 mg Oral ONCE-1800  . Warfarin - Pharmacist Dosing Inpatient   Does not apply q1800   Continuous Infusions:  Marzetta Board, MD, PhD [facility] Pager [phone]  If 7PM-7AM, please contact night-coverage [email or web address] Password [email or web address] 07/04/2016, 2:21 PM

## 2016-07-04 NOTE — Progress Notes (Signed)
[facility] Progress Note   Subjective:  Is npo now and nausea much better, still some LLQ pain w BM's but not as bad. Stools soft.  On IV abx now.   Objective Vitals:   07/03/16 0519 07/03/16 2100 07/04/16 0500 07/04/16 0851  BP: (!) 116/43 (!) 100/43 (!) 114/40 (!) 105/46  Pulse: 62 (!) 56 62   Resp:  17 15   Temp: 97.5 F (36.4 C) 98.7 F (37.1 C) 98.5 F (36.9 C)   TempSrc: Oral     SpO2: 96% 94% 95%   Weight: 108.7 kg (239 lb 11.2 oz)  109.2 kg (240 lb 12.8 oz)   Height:       Physical Exam General: Alert, oriented, very pleasant. Heart: RRR I4,P3, 1/6 systolic M Lungs: BBS CTAB A/P Abdomen: soft, non-tender, non-distended.  Extremities: Trace pre tib edema RLE, no edema LLE Dialysis Access: RFA AVF+ bruit   Additional Objective Labs: Basic Metabolic Panel:  Recent Labs Lab 06/30/16 0508 07/01/16 0815 07/04/16 0414  NA 131* 131* 130*  K 4.1 4.1 4.1  CL 94* 95* 92*  CO2 24 26 28   GLUCOSE 87 104* 65  BUN 30* 41* 15  CREATININE 9.77* 11.53* 7.40*  CALCIUM 7.9* 7.9* 7.8*  PHOS  --  6.0*  --    Liver Function Tests:  Recent Labs Lab 07/01/16 0815  ALBUMIN 3.2*   No results for input(s): LIPASE, AMYLASE in the last 168 hours. CBC:  Recent Labs Lab 06/29/16 0248 06/30/16 0508 07/01/16 0823 07/02/16 1316 07/04/16 0414  WBC 6.0 5.6 5.5 7.8 6.5  NEUTROABS 2.9 2.6  --   --   --   HGB 10.1* 10.3* 10.2* 11.8* 10.3*  HCT 32.2* 33.3* 32.1* 37.2 32.4*  MCV 91.7 91.5 89.7 91.9 91.5  PLT 237 252 271 212 238   Blood Culture    Component Value Date/Time   SDES URINE, CLEAN CATCH 06/30/2012 2201   SPECREQUEST NONE 06/30/2012 2201   CULT KLEBSIELLA PNEUMONIAE 06/30/2012 2201   REPTSTATUS 07/02/2012 FINAL 06/30/2012 2201    Cardiac Enzymes: No results for input(s): CKTOTAL, CKMB, CKMBINDEX, TROPONINI in the last 168 hours. CBG: No results for input(s): GLUCAP in the last 168 hours. Iron Studies: No results for input(s): IRON, TIBC,  TRANSFERRIN, FERRITIN in the last 72 hours. @lablastinr3 @ Studies/Results: Ct Abdomen Pelvis W Contrast  Result Date: 07/03/2016 CLINICAL DATA:  Left lower quadrant abdominal pain for several months. EXAM: CT ABDOMEN AND PELVIS WITH CONTRAST TECHNIQUE: Multidetector CT imaging of the abdomen and pelvis was performed using the standard protocol following bolus administration of intravenous contrast. CONTRAST:  147mL ISOVUE-300 IOPAMIDOL (ISOVUE-300) INJECTION 61% COMPARISON:  CT scan of June 25, 2016. FINDINGS: Lower chest: No acute abnormality. Hepatobiliary: No focal liver abnormality is seen. No gallstones, gallbladder wall thickening, or biliary dilatation. Pancreas: Unremarkable. No pancreatic ductal dilatation or surrounding inflammatory changes. Spleen: Normal in size without focal abnormality. Adrenals/Urinary Tract: Adrenal glands appear normal. Bilateral renal atrophy is noted with stable bilateral renal cysts. No hydronephrosis or renal obstruction is noted. Urinary bladder is decompressed. Stomach/Bowel: The appendix appears normal. There is no evidence of bowel obstruction. Sigmoid diverticulosis is noted with wall thickening and surrounding inflammation suggesting sigmoid diverticulitis. There does appear to an increased amount of gas in the peritoneal fat adjacent to sigmoid colon concerning for micro perforation related to inflammation. Vascular/Lymphatic: Aortic atherosclerosis. No enlarged abdominal or pelvic lymph nodes. Reproductive: Uterus and bilateral adnexa are unremarkable. Other: No abdominal wall hernia or  abnormality. No abdominopelvic ascites. Musculoskeletal: Status post surgical posterior fusion of L4-5 with bilateral intrapedicular screw placement. Multilevel degenerative disc disease is noted in the lower lumbar spine. IMPRESSION: Findings consistent with acute sigmoid diverticulitis, with increased amount of gas seen in adjacent peritoneal fat compared to prior exam,  consistent with micro perforation and possible developing abscess. Aortic atherosclerosis. Electronically Signed   By: Marijo Conception, M.D.   On: 07/03/2016 08:51   Medications:  . amiodarone  200 mg Oral Daily  . carvedilol  25 mg Oral BID WC  . cinacalcet  30 mg Oral Q supper  . darbepoetin (ARANESP) injection - DIALYSIS  60 mcg Intravenous Q Mon-HD  . docusate sodium  200 mg Oral BID  . piperacillin-tazobactam (ZOSYN)  IV  3.375 g Intravenous Q12H  . sodium chloride flush  3 mL Intravenous Q12H  . warfarin  7.5 mg Oral ONCE-1800  . Warfarin - Pharmacist Dosing Inpatient   Does not apply q1800   Dialysis Orders: [facility] MWF 3 hours 45 minutes 450/800 111.5 kg  2.0 K/2.0 Ca  Heparin: 2000 units IV q tx Hectoral 3 mcg IV q tx (Last PTH 394 06/10/16) Mircera 50 mcg IV q 2 weeks (last dose 06/25/16 Last HGB 11.1 06/18/16)  BMD meds:  Renvela 800 mg 2 tabs PO TID with meals Sensipar 30 mg PO q day Last BMD labs: Ca 8.5 C Ca 8.8 Phos 4.8 PTH 394 06/10/16)  Assessment/Plan: 1  Acute diverticulitis - Repeat CT of ab shows acute diverticulitis micro perforation and possible developing abscess. Now on IV abx and NPO.   2  ESRD HD today 3  Atypical CP - trops neg, no further w/u per cards 4  HTN - meds decreased, on low dose coreg only now 5  Volume: under dry, keep even w HD. Not eating 6  Afib on coumadin 7  MBD: binder on hold D/T being NPO. Cont VDRA.  8. Nutrition: Albumin 3.2. NPO except ice chips. 9. Anemia: HGB 11.8 07/02/16. Last ESA dose Aranesp 60 mcg IV 07/01/16. Follow HGB.    Kelly Splinter MD [employer] pager [phone]   07/04/2016, 11:33 AM

## 2016-07-05 LAB — PROTIME-INR
INR: 2.71
Prothrombin Time: 29.3 seconds — ABNORMAL HIGH (ref 11.4–15.2)

## 2016-07-05 LAB — CBC
HCT: 35.8 % — ABNORMAL LOW (ref 36.0–46.0)
Hemoglobin: 11.1 g/dL — ABNORMAL LOW (ref 12.0–15.0)
MCH: 28.5 pg (ref 26.0–34.0)
MCHC: 31 g/dL (ref 30.0–36.0)
MCV: 92 fL (ref 78.0–100.0)
PLATELETS: 265 10*3/uL (ref 150–400)
RBC: 3.89 MIL/uL (ref 3.87–5.11)
RDW: 17.7 % — AB (ref 11.5–15.5)
WBC: 6.2 10*3/uL (ref 4.0–10.5)

## 2016-07-05 LAB — RENAL FUNCTION PANEL
ALBUMIN: 3 g/dL — AB (ref 3.5–5.0)
Anion gap: 16 — ABNORMAL HIGH (ref 5–15)
BUN: 8 mg/dL (ref 6–20)
CALCIUM: 8 mg/dL — AB (ref 8.9–10.3)
CO2: 24 mmol/L (ref 22–32)
CREATININE: 4.89 mg/dL — AB (ref 0.44–1.00)
Chloride: 93 mmol/L — ABNORMAL LOW (ref 101–111)
GFR calc Af Amer: 9 mL/min — ABNORMAL LOW (ref >60)
GFR calc non Af Amer: 8 mL/min — ABNORMAL LOW (ref >60)
GLUCOSE: 57 mg/dL — AB (ref 65–99)
PHOSPHORUS: 4.6 mg/dL (ref 2.5–4.6)
Potassium: 3.9 mmol/L (ref 3.5–5.1)
SODIUM: 133 mmol/L — AB (ref 135–145)

## 2016-07-05 MED ORDER — WARFARIN SODIUM 2.5 MG PO TABS
2.5000 mg | ORAL_TABLET | Freq: Once | ORAL | Status: AC
  Administered 2016-07-05: 2.5 mg via ORAL
  Filled 2016-07-05: qty 1

## 2016-07-05 MED ORDER — DEXTROSE 50 % IV SOLN
25.0000 mL | Freq: Once | INTRAVENOUS | Status: AC
  Administered 2016-07-05: 25 mL via INTRAVENOUS
  Filled 2016-07-05: qty 50

## 2016-07-05 MED ORDER — DEXTROSE-NACL 5-0.45 % IV SOLN
INTRAVENOUS | Status: AC
  Administered 2016-07-05: 14:00:00 via INTRAVENOUS

## 2016-07-05 MED ORDER — CARVEDILOL 12.5 MG PO TABS
12.5000 mg | ORAL_TABLET | Freq: Two times a day (BID) | ORAL | Status: DC
  Administered 2016-07-05: 12.5 mg via ORAL
  Filled 2016-07-05 (×2): qty 1

## 2016-07-05 NOTE — Progress Notes (Signed)
[facility] Progress Note   Subjective: no c/o  Objective Vitals:   07/04/16 1630 07/04/16 1645 07/04/16 2018 07/05/16 0500  BP: 115/60 (!) 113/45 (!) 97/39 (!) 106/34  Pulse: 66 65 60 65  Resp:   17 15  Temp:  98.1 F (36.7 C) 98.2 F (36.8 C) 98.1 F (36.7 C)  TempSrc:  Oral    SpO2:  95% 97% 96%  Weight:  108.4 kg (238 lb 15.7 oz)  107.9 kg (237 lb 12.8 oz)  Height:       Physical Exam General: Alert, oriented, very pleasant. Heart: RRR Z6,X0, 1/6 systolic M Lungs: BBS CTAB A/P Abdomen: soft, non-tender, non-distended.  Extremities: Trace pre tib edema RLE, no edema LLE Dialysis Access: RFA AVF+ bruit   Additional Objective Labs: Basic Metabolic Panel:  Recent Labs Lab 07/01/16 0815 07/04/16 0414 07/05/16 0215  NA 131* 130* 133*  K 4.1 4.1 3.9  CL 95* 92* 93*  CO2 26 28 24   GLUCOSE 104* 65 57*  BUN 41* 15 8  CREATININE 11.53* 7.40* 4.89*  CALCIUM 7.9* 7.8* 8.0*  PHOS 6.0*  --  4.6   Liver Function Tests:  Recent Labs Lab 07/01/16 0815 07/05/16 0215  ALBUMIN 3.2* 3.0*   No results for input(s): LIPASE, AMYLASE in the last 168 hours. CBC:  Recent Labs Lab 06/29/16 0248 06/30/16 0508 07/01/16 0823 07/02/16 1316 07/04/16 0414 07/05/16 0215  WBC 6.0 5.6 5.5 7.8 6.5 6.2  NEUTROABS 2.9 2.6  --   --   --   --   HGB 10.1* 10.3* 10.2* 11.8* 10.3* 11.1*  HCT 32.2* 33.3* 32.1* 37.2 32.4* 35.8*  MCV 91.7 91.5 89.7 91.9 91.5 92.0  PLT 237 252 271 212 238 265   Blood Culture    Component Value Date/Time   SDES URINE, CLEAN CATCH 06/30/2012 2201   SPECREQUEST NONE 06/30/2012 2201   CULT KLEBSIELLA PNEUMONIAE 06/30/2012 2201   REPTSTATUS 07/02/2012 FINAL 06/30/2012 2201    Cardiac Enzymes: No results for input(s): CKTOTAL, CKMB, CKMBINDEX, TROPONINI in the last 168 hours. CBG: No results for input(s): GLUCAP in the last 168 hours. Iron Studies: No results for input(s): IRON, TIBC, TRANSFERRIN, FERRITIN in the last 72  hours. @lablastinr3 @ Studies/Results: No results found. Medications:  . amiodarone  200 mg Oral Daily  . carvedilol  25 mg Oral BID WC  . cinacalcet  30 mg Oral Q supper  . darbepoetin (ARANESP) injection - DIALYSIS  60 mcg Intravenous Q Mon-HD  . docusate sodium  200 mg Oral BID  . piperacillin-tazobactam (ZOSYN)  IV  3.375 g Intravenous Q12H  . sodium chloride flush  3 mL Intravenous Q12H  . warfarin  2.5 mg Oral ONCE-1800  . Warfarin - Pharmacist Dosing Inpatient   Does not apply q1800   Dialysis Orders: [facility] MWF 3 hours 45 minutes 450/800 111.5 kg  2.0 K/2.0 Ca  Heparin: 2000 units IV q tx Hectoral 3 mcg IV q tx (Last PTH 394 06/10/16) Mircera 50 mcg IV q 2 weeks (last dose 06/25/16 Last HGB 11.1 06/18/16)  BMD meds:  Renvela 800 mg 2 tabs PO TID with meals Sensipar 30 mg PO q day Last BMD labs: Ca 8.5 C Ca 8.8 Phos 4.8 PTH 394 06/10/16)  Assessment: 1  Acute diverticulitis - Repeat CT of abd showed worse diverticulitis w micro perf and possible developing abscess. Now on IV abx and NPO.   2  ESRD HD mwf 3  Volume: looks a bit dry 4  HTN - BP's soft, under dry wt 5  Atypical CP - trops neg, no further w/u per cards 6  Afib on coumadin/ coreg, HR 60's 7  MBD: binder on hold D/T being NPO. Cont VDRA.  8. Nutrition: Albumin 3.2. NPO except ice chips. 9. Anemia: HGB 11.8 07/02/16. Last ESA dose Aranesp 60 mcg IV 07/01/16. Follow HGB   Plan: IVF"s 50/hr, lower coreg 12.5 bid  Jane Splinter MD [facility] pager [phone]   07/05/2016, 1:47 PM

## 2016-07-05 NOTE — Progress Notes (Signed)
ANTICOAGULATION CONSULT NOTE - Follow Up Consult  Pharmacy Consult for Coumadin Indication: atrial fibrillation  Patient Measurements: Height: 5\' 5"  (165.1 cm) Weight: 237 lb 12.8 oz (107.9 kg) IBW/kg (Calculated) : 57  Vital Signs: Temp: 98.1 F (36.7 C) (12/16 0500) BP: 106/34 (12/16 0500) Pulse Rate: 65 (12/16 0500)  Labs:  Recent Labs  07/02/16 1316 07/03/16 0236 07/04/16 0414 07/05/16 0215  HGB 11.8*  --  10.3* 11.1*  HCT 37.2  --  32.4* 35.8*  PLT 212  --  238 265  LABPROT  --  24.8* 25.8* 29.3*  INR  --  2.20 2.32 2.71  CREATININE  --   --  7.40* 4.89*    Estimated Creatinine Clearance: 12.7 mL/min (by C-G formula based on SCr of 4.89 mg/dL (H)).  Assessment:  37 yoF on coumadin PTA for h/o Afib. INR therapeutic this morning at 2.71 but with significant increase from 2.32 on 12/15. Hgb improved to 11.1, plt wnl, no S/Sx bleeding documented.  PTA dose: 5mg  daily except 7.5mg  on Wednesdays and Fridays per anti-coag clinic  Goal of Therapy:  INR 2-3 Monitor platelets by anticoagulation protocol: Yes   Plan:  -Coumadin 2.5mg  x1 tonight as INR likely to increase again following 7.5mg  12/15 -Daily PT/INR -Monitor CBC, s/sx bleeding  Arrie Senate, PharmD PGY-1 Pharmacy Resident Pager: [phone] 07/05/2016

## 2016-07-05 NOTE — Progress Notes (Signed)
PROGRESS NOTE  Jane [name] SNK:[id number] DOB: Jun 15, 1946 DOA: 06/25/2016 PCP: [facility]   LOS: 9 days   Brief Narrative: 70 y.o.BF PMHx ESRD on HD M/W/F, A-Fib on warfarin, HTN, and anemiaWho presents with chest pain for 1 day and abdominal pain for 3 months. Regarding chest pain, this was a new complaint today. The patient went to HD, and during her session, developed a sharp, severe, constant pain under her right breast associated with shortness of breath and nausea. She finished her treatment, then the HD nurse administered O2 and nitroglycerin, which caused her pain to subside, and recommended she go to the ER.   Assessment & Plan: Principal Problem:   Diverticulitis of large intestine with perforation Active Problems:   ESRD on hemodialysis (HCC)   Paroxysmal atrial fibrillation (HCC)   Essential hypertension   Anemia in other chronic diseases classified elsewhere   Chest pain with moderate risk for cardiac etiology   Diverticulitis with perforation - repeat CT scan 12/14 with "increased amount of gas seen in adjacent peritoneal fat compared to prior exam, consistent with micro perforation and possible developing absces" - she was initially on Cipro flagyl but now on Zosyn per surgery  - improving allow liquids per surgery today, continue Zosyn  ESRD on HD - Patient clinically continues to be euvolemic. - HD per nephrology   HTN - Patient's BP currently borderline hypotensive. Hold losartan. - Continue Amiodarone 200 mg daily - continue Coreg  Paroxysmal atrial fibrillation - Currently in NSR - continue Coumadin  Anemia of chronic Renal disease - No signs of obstructive bleed - Stable   DVT prophylaxis: Coumadin Code Status: Full Family Communication: no family bedside Disposition Plan: home when ready   Consultants:   General surgery  Cardiology   Nephrology   Procedures:   None   Antimicrobials:  Zosyn 12/14 >>    Subjective: - no chest pain, shortness of breath, no abdominal pain, nausea or vomiting.   Objective: Vitals:   07/04/16 1630 07/04/16 1645 07/04/16 2018 07/05/16 0500  BP: 115/60 (!) 113/45 (!) 97/39 (!) 106/34  Pulse: 66 65 60 65  Resp:   17 15  Temp:  98.1 F (36.7 C) 98.2 F (36.8 C) 98.1 F (36.7 C)  TempSrc:  Oral    SpO2:  95% 97% 96%  Weight:  108.4 kg (238 lb 15.7 oz)  107.9 kg (237 lb 12.8 oz)  Height:        Intake/Output Summary (Last 24 hours) at 07/05/16 1349 Last data filed at 07/05/16 0820  Gross per 24 hour  Intake              200 ml  Output              800 ml  Net             -600 ml   Filed Weights   07/04/16 0500 07/04/16 1645 07/05/16 0500  Weight: 109.2 kg (240 lb 12.8 oz) 108.4 kg (238 lb 15.7 oz) 107.9 kg (237 lb 12.8 oz)    Examination: Constitutional: NAD Vitals:   07/04/16 1630 07/04/16 1645 07/04/16 2018 07/05/16 0500  BP: 115/60 (!) 113/45 (!) 97/39 (!) 106/34  Pulse: 66 65 60 65  Resp:   17 15  Temp:  98.1 F (36.7 C) 98.2 F (36.8 C) 98.1 F (36.7 C)  TempSrc:  Oral    SpO2:  95% 97% 96%  Weight:  108.4 kg (238 lb 15.7 oz)  107.9 kg (237 lb 12.8 oz)  Height:       Respiratory: clear to auscultation bilaterally, no wheezing, no crackles.  Cardiovascular: Regular rate and rhythm, no murmurs / rubs / gallops. No LE edema. Abdomen: + tenderness LLQ. Bowel sounds positive.  Musculoskeletal: no clubbing / cyanosis.    Data Reviewed: I have personally reviewed following labs and imaging studies  CBC:  Recent Labs Lab 06/29/16 0248 06/30/16 0508 07/01/16 0823 07/02/16 1316 07/04/16 0414 07/05/16 0215  WBC 6.0 5.6 5.5 7.8 6.5 6.2  NEUTROABS 2.9 2.6  --   --   --   --   HGB 10.1* 10.3* 10.2* 11.8* 10.3* 11.1*  HCT 32.2* 33.3* 32.1* 37.2 32.4* 35.8*  MCV 91.7 91.5 89.7 91.9 91.5 92.0  PLT 237 252 271 212 238 193   Basic Metabolic Panel:  Recent Labs Lab 06/29/16 0248 06/30/16 0508 07/01/16 0815 07/04/16 0414  07/05/16 0215  NA 132* 131* 131* 130* 133*  K 4.2 4.1 4.1 4.1 3.9  CL 95* 94* 95* 92* 93*  CO2 28 24 26 28 24   GLUCOSE 96 87 104* 65 57*  BUN 22* 30* 41* 15 8  CREATININE 7.42* 9.77* 11.53* 7.40* 4.89*  CALCIUM 8.2* 7.9* 7.9* 7.8* 8.0*  PHOS  --   --  6.0*  --  4.6   GFR: Estimated Creatinine Clearance: 12.7 mL/min (by C-G formula based on SCr of 4.89 mg/dL (H)). Liver Function Tests:  Recent Labs Lab 07/01/16 0815 07/05/16 0215  ALBUMIN 3.2* 3.0*   No results for input(s): LIPASE, AMYLASE in the last 168 hours. No results for input(s): AMMONIA in the last 168 hours. Coagulation Profile:  Recent Labs Lab 07/01/16 0505 07/02/16 0347 07/03/16 0236 07/04/16 0414 07/05/16 0215  INR 3.04 2.37 2.20 2.32 2.71   Cardiac Enzymes: No results for input(s): CKTOTAL, CKMB, CKMBINDEX, TROPONINI in the last 168 hours. BNP (last 3 results) No results for input(s): PROBNP in the last 8760 hours. HbA1C: No results for input(s): HGBA1C in the last 72 hours. CBG: No results for input(s): GLUCAP in the last 168 hours. Lipid Profile: No results for input(s): CHOL, HDL, LDLCALC, TRIG, CHOLHDL, LDLDIRECT in the last 72 hours. Thyroid Function Tests: No results for input(s): TSH, T4TOTAL, FREET4, T3FREE, THYROIDAB in the last 72 hours. Anemia Panel: No results for input(s): VITAMINB12, FOLATE, FERRITIN, TIBC, IRON, RETICCTPCT in the last 72 hours. Urine analysis:    Component Value Date/Time   COLORURINE AMBER (A) 06/30/2016 0503   APPEARANCEUR HAZY (A) 06/30/2016 0503   LABSPEC 1.020 06/30/2016 0503   PHURINE 5.0 06/30/2016 0503   GLUCOSEU NEGATIVE 06/30/2016 0503   HGBUR NEGATIVE 06/30/2016 0503   BILIRUBINUR SMALL (A) 06/30/2016 0503   KETONESUR NEGATIVE 06/30/2016 0503   PROTEINUR 30 (A) 06/30/2016 0503   UROBILINOGEN 0.2 06/30/2012 2201   NITRITE NEGATIVE 06/30/2016 0503   LEUKOCYTESUR SMALL (A) 06/30/2016 0503   Sepsis Labs: Invalid input(s): PROCALCITONIN,  LACTICIDVEN  Recent Results (from the past 240 hour(s))  MRSA PCR Screening     Status: None   Collection Time: 06/25/16 10:25 PM  Result Value Ref Range Status   MRSA by PCR NEGATIVE NEGATIVE Final    Comment:        The GeneXpert MRSA Assay (FDA approved for NASAL specimens only), is one component of a comprehensive MRSA colonization surveillance program. It is not intended to diagnose MRSA infection nor to guide or monitor treatment for MRSA infections.     Radiology Studies: No results  found.  Scheduled Meds: . amiodarone  200 mg Oral Daily  . carvedilol  25 mg Oral BID WC  . cinacalcet  30 mg Oral Q supper  . darbepoetin (ARANESP) injection - DIALYSIS  60 mcg Intravenous Q Mon-HD  . docusate sodium  200 mg Oral BID  . piperacillin-tazobactam (ZOSYN)  IV  3.375 g Intravenous Q12H  . sodium chloride flush  3 mL Intravenous Q12H  . warfarin  2.5 mg Oral ONCE-1800  . Warfarin - Pharmacist Dosing Inpatient   Does not apply q1800   Continuous Infusions:  Marzetta Board, MD, PhD [facility] Pager [phone]  If 7PM-7AM, please contact night-coverage [email or web address] Password [email or web address] 07/05/2016, 1:49 PM

## 2016-07-05 NOTE — Progress Notes (Signed)
  Subjective: States she is still having LLQ pain but it is better than admission; more noticeable when having a BM. Having loose BMs. Rates pain 7/10; when came in pain was >10. No n/v.   Objective: Vital signs in last 24 hours: Temp:  [97.7 F (36.5 C)-98.2 F (36.8 C)] 98.1 F (36.7 C) (12/16 0500) Pulse Rate:  [60-66] 65 (12/16 0500) Resp:  [15-17] 15 (12/16 0500) BP: (97-165)/(32-66) 106/34 (12/16 0500) SpO2:  [95 %-97 %] 96 % (12/16 0500) Weight:  [107.9 kg (237 lb 12.8 oz)-108.4 kg (238 lb 15.7 oz)] 107.9 kg (237 lb 12.8 oz) (12/16 0500) Last BM Date: 07/04/16  Intake/Output from previous day: 12/15 0701 - 12/16 0700 In: 170 [P.O.:120; IV Piggyback:50] Out: 800 [Urine:300] Intake/Output this shift: Total I/O In: 200 [P.O.:200] Out: -   Alert, sitting on edge of bed Not ill appearing, non toxic cta Soft, obese, mild LLQ/suprapubic TTP, no rebound/guarding.   Lab Results:   Recent Labs  07/04/16 0414 07/05/16 0215  WBC 6.5 6.2  HGB 10.3* 11.1*  HCT 32.4* 35.8*  PLT 238 265   BMET  Recent Labs  07/04/16 0414 07/05/16 0215  NA 130* 133*  K 4.1 3.9  CL 92* 93*  CO2 28 24  GLUCOSE 65 57*  BUN 15 8  CREATININE 7.40* 4.89*  CALCIUM 7.8* 8.0*   PT/INR  Recent Labs  07/04/16 0414 07/05/16 0215  LABPROT 25.8* 29.3*  INR 2.32 2.71   ABG No results for input(s): PHART, HCO3 in the last 72 hours.  Invalid input(s): PCO2, PO2  Studies/Results: No results found.  Anti-infectives: Anti-infectives    Start     Dose/Rate Route Frequency Ordered Stop   07/03/16 1015  piperacillin-tazobactam (ZOSYN) IVPB 3.375 g     3.375 g 12.5 mL/hr over 240 Minutes Intravenous Every 12 hours 07/03/16 1001     07/01/16 2200  ciprofloxacin (CIPRO) tablet 500 mg  Status:  Discontinued     500 mg Oral Daily at bedtime 07/01/16 1400 07/03/16 0922   06/26/16 2200  ciprofloxacin (CIPRO) IVPB 400 mg  Status:  Discontinued     400 mg 200 mL/hr over 60 Minutes  Intravenous Every 24 hours 06/26/16 0035 07/01/16 2111   06/25/16 2200  ciprofloxacin (CIPRO) tablet 500 mg  Status:  Discontinued     500 mg Oral Every 24 hours 06/25/16 2146 06/26/16 0030   06/25/16 2200  metroNIDAZOLE (FLAGYL) tablet 500 mg     500 mg Oral Every 8 hours 06/25/16 2146 07/02/16 2159      Assessment/Plan: Diverticulitis with micro perforation-  Will start on diet today. No fever. No wbc. Exam not that impressive. Discussion with pt about diverticulitis and mgmt; if doesn't do well with liquid intake this time around will need repeat scan; may ultimately need surgery this admission for failure to progress.  Chest pain-resolved, ptseen by cardiology History of atrial fibrillation- on warfarin, amiodarone and Coreg.  Hypertension-onlosartan, furosemide. ESRD on hemodialysis- MWF HD, nephrology following SH:FWYOV IV x 5 days, day 3 oral Cipro, day 9 PO flagyl  - Pt on day 3 Zosyn ZCH:YIFO let have full liquids today (pt states clears are nasty) VTE:SCD's and warfarin INR 2.7  Ambers Iyengar M. Redmond Pulling, MD, FACS General, Bariatric, & Minimally Invasive Surgery [facility] [place]   LOS: 9 days    Gayland Curry 07/05/2016

## 2016-07-06 LAB — CBC
HCT: 32.6 % — ABNORMAL LOW (ref 36.0–46.0)
Hemoglobin: 10.6 g/dL — ABNORMAL LOW (ref 12.0–15.0)
MCH: 29.4 pg (ref 26.0–34.0)
MCHC: 32.5 g/dL (ref 30.0–36.0)
MCV: 90.3 fL (ref 78.0–100.0)
Platelets: 274 10*3/uL (ref 150–400)
RBC: 3.61 MIL/uL — ABNORMAL LOW (ref 3.87–5.11)
RDW: 18 % — AB (ref 11.5–15.5)
WBC: 5.3 10*3/uL (ref 4.0–10.5)

## 2016-07-06 LAB — PROTIME-INR
INR: 4.15 — AB
PROTHROMBIN TIME: 41 s — AB (ref 11.4–15.2)

## 2016-07-06 LAB — GLUCOSE, CAPILLARY
GLUCOSE-CAPILLARY: 96 mg/dL (ref 65–99)
GLUCOSE-CAPILLARY: 115 mg/dL — AB (ref 65–99)

## 2016-07-06 MED ORDER — HEPARIN SODIUM (PORCINE) 1000 UNIT/ML DIALYSIS
1000.0000 [IU] | INTRAMUSCULAR | Status: DC | PRN
  Filled 2016-07-06: qty 1

## 2016-07-06 MED ORDER — SODIUM CHLORIDE 0.9 % IV SOLN: 100.0000 mL | INTRAVENOUS | Status: DC | PRN

## 2016-07-06 MED ORDER — PENTAFLUOROPROP-TETRAFLUOROETH EX AERO: 1.0000 "application " | INHALATION_SPRAY | CUTANEOUS | Status: DC | PRN

## 2016-07-06 MED ORDER — LIDOCAINE-PRILOCAINE 2.5-2.5 % EX CREA
1.0000 "application " | TOPICAL_CREAM | CUTANEOUS | Status: DC | PRN
  Filled 2016-07-06: qty 5

## 2016-07-06 MED ORDER — HEPARIN SODIUM (PORCINE) 1000 UNIT/ML DIALYSIS
2000.0000 [IU] | Freq: Once | INTRAMUSCULAR | Status: DC
  Filled 2016-07-06: qty 2

## 2016-07-06 MED ORDER — ALTEPLASE 2 MG IJ SOLR: 2.0000 mg | Freq: Once | INTRAMUSCULAR | Status: DC | PRN

## 2016-07-06 MED ORDER — LIDOCAINE HCL (PF) 1 % IJ SOLN: 5.0000 mL | INTRAMUSCULAR | Status: DC | PRN

## 2016-07-06 NOTE — Plan of Care (Signed)
Problem: Fluid Volume: Goal: Ability to maintain a balanced intake and output will improve Outcome: Progressing Monitor Intake and Output.

## 2016-07-06 NOTE — Progress Notes (Signed)
PROGRESS NOTE  Jane [name] EPP:[id number] DOB: Jun 22, 1946 DOA: 06/25/2016 PCP: [facility]   LOS: 10 days   Brief Narrative: 70 y.o.BF PMHx ESRD on HD M/W/F, A-Fib on warfarin, HTN, and anemiaWho presents with chest pain for 1 day and abdominal pain for 3 months. Regarding chest pain, this was a new complaint today. The patient went to HD, and during her session, developed a sharp, severe, constant pain under her right breast associated with shortness of breath and nausea. She finished her treatment, then the HD nurse administered O2 and nitroglycerin, which caused her pain to subside, and recommended she go to the ER.   Assessment & Plan: Principal Problem:   Diverticulitis of large intestine with perforation Active Problems:   ESRD on hemodialysis (HCC)   Paroxysmal atrial fibrillation (HCC)   Essential hypertension   Anemia in other chronic diseases classified elsewhere   Chest pain with moderate risk for cardiac etiology   Diverticulitis with perforation - repeat CT scan 12/14 with "increased amount of gas seen in adjacent peritoneal fat compared to prior exam, consistent with micro perforation and possible developing absces" - she was initially on Cipro flagyl but now on Zosyn per surgery  - improving, less reported pain and LLQ no longer tender on exam, advance to soft today  ESRD on HD - Patient clinically continues to be euvolemic. - HD per nephrology   HTN - Patient's BP currently borderline. Hold losartan. - Continue Amiodarone 200 mg daily - continue Coreg  Paroxysmal atrial fibrillation - Currently in NSR - continue Coumadin  Anemia of chronic Renal disease - No signs of obstructive bleed - Stable   DVT prophylaxis: Coumadin Code Status: Full Family Communication: no family bedside Disposition Plan: home when ready   Consultants:   General surgery  Cardiology   Nephrology   Procedures:   None   Antimicrobials:  Zosyn  12/14 >>   Subjective: - feels better, reports less pain  Objective: Vitals:   07/05/16 1418 07/05/16 2100 07/06/16 0500 07/06/16 0851  BP: (!) 126/52 (!) 92/32 (!) 101/29 (!) 106/40  Pulse: 63 (!) 56 (!) 57 (!) 58  Resp: 15 17 16    Temp: 97.4 F (36.3 C) 98.3 F (36.8 C) 98.2 F (36.8 C)   TempSrc: Oral  Oral   SpO2: 95% 96% 96%   Weight:   108.5 kg (239 lb 4.8 oz)   Height:        Intake/Output Summary (Last 24 hours) at 07/06/16 1035 Last data filed at 07/05/16 2359  Gross per 24 hour  Intake              898 ml  Output                0 ml  Net              898 ml   Filed Weights   07/04/16 1645 07/05/16 0500 07/06/16 0500  Weight: 108.4 kg (238 lb 15.7 oz) 107.9 kg (237 lb 12.8 oz) 108.5 kg (239 lb 4.8 oz)    Examination: Constitutional: NAD Vitals:   07/05/16 1418 07/05/16 2100 07/06/16 0500 07/06/16 0851  BP: (!) 126/52 (!) 92/32 (!) 101/29 (!) 106/40  Pulse: 63 (!) 56 (!) 57 (!) 58  Resp: 15 17 16    Temp: 97.4 F (36.3 C) 98.3 F (36.8 C) 98.2 F (36.8 C)   TempSrc: Oral  Oral   SpO2: 95% 96% 96%   Weight:   108.5 kg (  239 lb 4.8 oz)   Height:       Respiratory: clear to auscultation bilaterally, no wheezing, no crackles.  Cardiovascular: Regular rate and rhythm, no murmurs / rubs / gallops. No LE edema. Abdomen: minimal tenderness LLQ, significantly improved. Bowel sounds positive.  Musculoskeletal: no clubbing / cyanosis.    Data Reviewed: I have personally reviewed following labs and imaging studies  CBC:  Recent Labs Lab 06/30/16 0508 07/01/16 0823 07/02/16 1316 07/04/16 0414 07/05/16 0215 07/06/16 0320  WBC 5.6 5.5 7.8 6.5 6.2 5.3  NEUTROABS 2.6  --   --   --   --   --   HGB 10.3* 10.2* 11.8* 10.3* 11.1* 10.6*  HCT 33.3* 32.1* 37.2 32.4* 35.8* 32.6*  MCV 91.5 89.7 91.9 91.5 92.0 90.3  PLT 252 271 212 238 265 381   Basic Metabolic Panel:  Recent Labs Lab 06/30/16 0508 07/01/16 0815 07/04/16 0414 07/05/16 0215  NA 131* 131*  130* 133*  K 4.1 4.1 4.1 3.9  CL 94* 95* 92* 93*  CO2 24 26 28 24   GLUCOSE 87 104* 65 57*  BUN 30* 41* 15 8  CREATININE 9.77* 11.53* 7.40* 4.89*  CALCIUM 7.9* 7.9* 7.8* 8.0*  PHOS  --  6.0*  --  4.6   GFR: Estimated Creatinine Clearance: 12.7 mL/min (by C-G formula based on SCr of 4.89 mg/dL (H)). Liver Function Tests:  Recent Labs Lab 07/01/16 0815 07/05/16 0215  ALBUMIN 3.2* 3.0*   No results for input(s): LIPASE, AMYLASE in the last 168 hours. No results for input(s): AMMONIA in the last 168 hours. Coagulation Profile:  Recent Labs Lab 07/02/16 0347 07/03/16 0236 07/04/16 0414 07/05/16 0215 07/06/16 0320  INR 2.37 2.20 2.32 2.71 4.15*   Cardiac Enzymes: No results for input(s): CKTOTAL, CKMB, CKMBINDEX, TROPONINI in the last 168 hours. BNP (last 3 results) No results for input(s): PROBNP in the last 8760 hours. HbA1C: No results for input(s): HGBA1C in the last 72 hours. CBG:  Recent Labs Lab 07/06/16 0730  GLUCAP 96   Lipid Profile: No results for input(s): CHOL, HDL, LDLCALC, TRIG, CHOLHDL, LDLDIRECT in the last 72 hours. Thyroid Function Tests: No results for input(s): TSH, T4TOTAL, FREET4, T3FREE, THYROIDAB in the last 72 hours. Anemia Panel: No results for input(s): VITAMINB12, FOLATE, FERRITIN, TIBC, IRON, RETICCTPCT in the last 72 hours. Urine analysis:    Component Value Date/Time   COLORURINE AMBER (A) 06/30/2016 0503   APPEARANCEUR HAZY (A) 06/30/2016 0503   LABSPEC 1.020 06/30/2016 0503   PHURINE 5.0 06/30/2016 0503   GLUCOSEU NEGATIVE 06/30/2016 0503   HGBUR NEGATIVE 06/30/2016 0503   BILIRUBINUR SMALL (A) 06/30/2016 0503   KETONESUR NEGATIVE 06/30/2016 0503   PROTEINUR 30 (A) 06/30/2016 0503   UROBILINOGEN 0.2 06/30/2012 2201   NITRITE NEGATIVE 06/30/2016 0503   LEUKOCYTESUR SMALL (A) 06/30/2016 0503   Sepsis Labs: Invalid input(s): PROCALCITONIN, LACTICIDVEN  No results found for this or any previous visit (from the past 240  hour(s)).  Radiology Studies: No results found.  Scheduled Meds: . amiodarone  200 mg Oral Daily  . cinacalcet  30 mg Oral Q supper  . darbepoetin (ARANESP) injection - DIALYSIS  60 mcg Intravenous Q Mon-HD  . docusate sodium  200 mg Oral BID  . piperacillin-tazobactam (ZOSYN)  IV  3.375 g Intravenous Q12H  . sodium chloride flush  3 mL Intravenous Q12H  . Warfarin - Pharmacist Dosing Inpatient   Does not apply q1800   Continuous Infusions: . dextrose 5 %  and 0.45% NaCl 45 mL/hr at 07/05/16 [place], MD, PhD [facility] Pager [phone]  If 7PM-7AM, please contact night-coverage [email or web address] Password [email or web address] 07/06/2016, 10:35 AM

## 2016-07-06 NOTE — Plan of Care (Signed)
Problem: Pain Managment: Goal: General experience of comfort will improve Outcome: Progressing Assess for s/s abdominal pain

## 2016-07-06 NOTE — Progress Notes (Signed)
  Subjective: No abdominal pain  Objective: Vital signs in last 24 hours: Temp:  [97.4 F (36.3 C)-98.3 F (36.8 C)] 98.2 F (36.8 C) (12/17 0500) Pulse Rate:  [56-63] 58 (12/17 0851) Resp:  [15-17] 16 (12/17 0500) BP: (92-126)/(29-52) 106/40 (12/17 0851) SpO2:  [95 %-96 %] 96 % (12/17 0500) Weight:  [108.5 kg (239 lb 4.8 oz)] 108.5 kg (239 lb 4.8 oz) (12/17 0500) Last BM Date: 07/04/16  Intake/Output from previous day: 12/16 0701 - 12/17 0700 In: 1098 [P.O.:560; I.V.:438; IV Piggyback:100] Out: -  Intake/Output this shift: No intake/output data recorded.  GI: soft NT   no rebound or guarding   Lab Results:   Recent Labs  07/05/16 0215 07/06/16 0320  WBC 6.2 5.3  HGB 11.1* 10.6*  HCT 35.8* 32.6*  PLT 265 274   BMET  Recent Labs  07/04/16 0414 07/05/16 0215  NA 130* 133*  K 4.1 3.9  CL 92* 93*  CO2 28 24  GLUCOSE 65 57*  BUN 15 8  CREATININE 7.40* 4.89*  CALCIUM 7.8* 8.0*   PT/INR  Recent Labs  07/05/16 0215 07/06/16 0320  LABPROT 29.3* 41.0*  INR 2.71 4.15*   ABG No results for input(s): PHART, HCO3 in the last 72 hours.  Invalid input(s): PCO2, PO2  Studies/Results: No results found.  Anti-infectives: Anti-infectives    Start     Dose/Rate Route Frequency Ordered Stop   07/03/16 1015  piperacillin-tazobactam (ZOSYN) IVPB 3.375 g     3.375 g 12.5 mL/hr over 240 Minutes Intravenous Every 12 hours 07/03/16 1001     07/01/16 2200  ciprofloxacin (CIPRO) tablet 500 mg  Status:  Discontinued     500 mg Oral Daily at bedtime 07/01/16 1400 07/03/16 0922   06/26/16 2200  ciprofloxacin (CIPRO) IVPB 400 mg  Status:  Discontinued     400 mg 200 mL/hr over 60 Minutes Intravenous Every 24 hours 06/26/16 0035 07/01/16 2111   06/25/16 2200  ciprofloxacin (CIPRO) tablet 500 mg  Status:  Discontinued     500 mg Oral Every 24 hours 06/25/16 2146 06/26/16 0030   06/25/16 2200  metroNIDAZOLE (FLAGYL) tablet 500 mg     500 mg Oral Every 8 hours 06/25/16  2146 07/02/16 2159      Assessment/Plan: Patient Active Problem List   Diagnosis Date Noted  . Diverticulitis of large intestine with perforation 06/26/2016  . Anemia in other chronic diseases classified elsewhere 06/25/2016  . Chest pain with moderate risk for cardiac etiology 06/25/2016  . ESRD on hemodialysis ([place]) 06/30/2012  . Paroxysmal atrial fibrillation ([place]) 06/30/2012  . Essential hypertension 06/30/2012  . UTI (lower urinary tract infection) 06/30/2012     Advance diet No acute surgical need     LOS: 10 days    Zema Lizardo A. 07/06/2016

## 2016-07-06 NOTE — Progress Notes (Addendum)
[facility] Progress Note   Subjective: sig abd pain episode assoc with BM this am, pain resolved now. Nausea better, taking full liquid diet  Objective Vitals:   07/05/16 1418 07/05/16 2100 07/06/16 0500 07/06/16 0851  BP: (!) 126/52 (!) 92/32 (!) 101/29 (!) 106/40  Pulse: 63 (!) 56 (!) 57 (!) 58  Resp: 15 17 16    Temp: 97.4 F (36.3 C) 98.3 F (36.8 C) 98.2 F (36.8 C)   TempSrc: Oral  Oral   SpO2: 95% 96% 96%   Weight:   108.5 kg (239 lb 4.8 oz)   Height:       Physical Exam General: Alert, oriented, very pleasant. Heart: RRR C5,E5, 1/6 systolic M Lungs: BBS CTAB A/P Abdomen: soft, non-tender, non-distended, no rebound Extremities: Trace pre tib edema RLE, no edema LLE Dialysis Access: RFA AVF+ bruit   Additional Objective Labs: Basic Metabolic Panel:  Recent Labs Lab 07/01/16 0815 07/04/16 0414 07/05/16 0215  NA 131* 130* 133*  K 4.1 4.1 3.9  CL 95* 92* 93*  CO2 26 28 24   GLUCOSE 104* 65 57*  BUN 41* 15 8  CREATININE 11.53* 7.40* 4.89*  CALCIUM 7.9* 7.8* 8.0*  PHOS 6.0*  --  4.6   Liver Function Tests:  Recent Labs Lab 07/01/16 0815 07/05/16 0215  ALBUMIN 3.2* 3.0*   No results for input(s): LIPASE, AMYLASE in the last 168 hours. CBC:  Recent Labs Lab 06/30/16 0508 07/01/16 0823 07/02/16 1316 07/04/16 0414 07/05/16 0215 07/06/16 0320  WBC 5.6 5.5 7.8 6.5 6.2 5.3  NEUTROABS 2.6  --   --   --   --   --   HGB 10.3* 10.2* 11.8* 10.3* 11.1* 10.6*  HCT 33.3* 32.1* 37.2 32.4* 35.8* 32.6*  MCV 91.5 89.7 91.9 91.5 92.0 90.3  PLT 252 271 212 238 265 274   Blood Culture    Component Value Date/Time   SDES URINE, CLEAN CATCH 06/30/2012 2201   SPECREQUEST NONE 06/30/2012 2201   CULT KLEBSIELLA PNEUMONIAE 06/30/2012 2201   REPTSTATUS 07/02/2012 FINAL 06/30/2012 2201    Cardiac Enzymes: No results for input(s): CKTOTAL, CKMB, CKMBINDEX, TROPONINI in the last 168 hours. CBG:  Recent Labs Lab 07/06/16 0730  GLUCAP 96    Iron Studies: No results for input(s): IRON, TIBC, TRANSFERRIN, FERRITIN in the last 72 hours. @lablastinr3 @ Studies/Results: No results found. Medications: . dextrose 5 % and 0.45% NaCl 45 mL/hr at 07/05/16 1415   . amiodarone  200 mg Oral Daily  . carvedilol  12.5 mg Oral BID WC  . cinacalcet  30 mg Oral Q supper  . darbepoetin (ARANESP) injection - DIALYSIS  60 mcg Intravenous Q Mon-HD  . docusate sodium  200 mg Oral BID  . piperacillin-tazobactam (ZOSYN)  IV  3.375 g Intravenous Q12H  . sodium chloride flush  3 mL Intravenous Q12H  . Warfarin - Pharmacist Dosing Inpatient   Does not apply q1800   Dialysis : [facility] MWF  3h 36min   111.5kg   2/2 bath  Hep 2000  RFA AVF Hectoral 3 mcg IV q tx (Last PTH 394 06/10/16) Mircera 50 mcg IV q 2 weeks (last dose 06/25/16 Last HGB 11.1 06/18/16)  BMD meds:  Renvela 800 mg 2 tabs PO TID with meals Sensipar 30 mg PO q day Last BMD labs: Ca 8.5 C Ca 8.8 Phos 4.8 PTH 394 06/10/16)  Assessment: 1  Acute diverticulitis/ microperforation - got worse on oral abx. Better w IV abx. Now advaced to  full liquid. Per surg/ primary 2  ESRD HD mwf 3  Volume: looks a bit dry, started IVF yest 4  HTN - BP's soft, under dry wt 5  Atypical CP - trops neg, no further w/u per cards 6  Hx Afib - in NSR, has been "regular" since DCCV 6 yrs ago ([facility]), on amio/ coreg/ coumadin 7  MBD: binder on hold. Cont VDRA.  8. Nutrition: Albumin 3.2.  9. Anemia: HGB 11.8 07/02/16. Last ESA dose Aranesp 60 mcg IV 07/01/16. Follow HGB   Plan:  Dec IVF 30/ hr, HD tomorrow, keep even  Kelly Splinter MD [employer] pager [phone]   07/06/2016, 10:23 AM

## 2016-07-06 NOTE — Progress Notes (Addendum)
ANTICOAGULATION CONSULT NOTE - Follow Up Consult  Pharmacy Consult for Coumadin Indication: atrial fibrillation  Patient Measurements: Height: 5\' 5"  (165.1 cm) Weight: 239 lb 4.8 oz (108.5 kg) IBW/kg (Calculated) : 57  Vital Signs: Temp: 98.2 F (36.8 C) (12/17 0500) Temp Source: Oral (12/17 0500) BP: 101/29 (12/17 0500) Pulse Rate: 57 (12/17 0500)  Labs:  Recent Labs  07/04/16 0414 07/05/16 0215 07/06/16 0320  HGB 10.3* 11.1* 10.6*  HCT 32.4* 35.8* 32.6*  PLT 238 265 274  LABPROT 25.8* 29.3* 41.0*  INR 2.32 2.71 4.15*  CREATININE 7.40* 4.89*  --     Estimated Creatinine Clearance: 12.7 mL/min (by C-G formula based on SCr of 4.89 mg/dL (H)).  Assessment:  Jane [name] on coumadin PTA for hx Afib. INR supratherapeutic this morning at 4.15, likely secondary to 7.5mg  dose on 12/15 so INR should decrease tomorrow. Hgb stable, no S/Sx bleeding noted per RN.   PTA dose: 5mg  daily except 7.5mg  on Wednesdays and Fridays per anti-coag clinic  Goal of Therapy:  INR 2-3 Monitor platelets by anticoagulation protocol: Yes   Plan:  -Hold Coumadin tonight -Daily PT/INR -Monitor CBC, s/sx bleeding closely  Arrie Senate, PharmD PGY-1 Pharmacy Resident Pager: [phone] 07/06/2016

## 2016-07-06 NOTE — Plan of Care (Signed)
Problem: Nutrition: Goal: Adequate nutrition will be maintained Outcome: Progressing Advance diet as tolerated.

## 2016-07-07 LAB — RENAL FUNCTION PANEL
Albumin: 2.8 g/dL — ABNORMAL LOW (ref 3.5–5.0)
Anion gap: 12 (ref 5–15)
BUN: 16 mg/dL (ref 6–20)
CHLORIDE: 95 mmol/L — AB (ref 101–111)
CO2: 23 mmol/L (ref 22–32)
CREATININE: 9.08 mg/dL — AB (ref 0.44–1.00)
Calcium: 7.6 mg/dL — ABNORMAL LOW (ref 8.9–10.3)
GFR calc Af Amer: 4 mL/min — ABNORMAL LOW (ref >60)
GFR calc non Af Amer: 4 mL/min — ABNORMAL LOW (ref >60)
Glucose, Bld: 91 mg/dL (ref 65–99)
Phosphorus: 5.5 mg/dL — ABNORMAL HIGH (ref 2.5–4.6)
Potassium: 3.6 mmol/L (ref 3.5–5.1)
Sodium: 130 mmol/L — ABNORMAL LOW (ref 135–145)

## 2016-07-07 LAB — PROTIME-INR
INR: 3.93
Prothrombin Time: 39.4 seconds — ABNORMAL HIGH (ref 11.4–15.2)

## 2016-07-07 LAB — CBC
HCT: 32.6 % — ABNORMAL LOW (ref 36.0–46.0)
Hemoglobin: 10.4 g/dL — ABNORMAL LOW (ref 12.0–15.0)
MCH: 28.3 pg (ref 26.0–34.0)
MCHC: 31.9 g/dL (ref 30.0–36.0)
MCV: 88.8 fL (ref 78.0–100.0)
PLATELETS: 248 10*3/uL (ref 150–400)
RBC: 3.67 MIL/uL — ABNORMAL LOW (ref 3.87–5.11)
RDW: 17.4 % — AB (ref 11.5–15.5)
WBC: 4.9 10*3/uL (ref 4.0–10.5)

## 2016-07-07 MED ORDER — NYSTATIN 100000 UNIT/ML MT SUSP
5.0000 mL | Freq: Four times a day (QID) | OROMUCOSAL | Status: DC
  Administered 2016-07-07 – 2016-07-17 (×17): 500000 [IU] via ORAL
  Filled 2016-07-07 (×26): qty 5

## 2016-07-07 MED ORDER — DARBEPOETIN ALFA 60 MCG/0.3ML IJ SOSY
60.0000 ug | PREFILLED_SYRINGE | INTRAMUSCULAR | Status: DC
  Administered 2016-07-09 – 2016-07-16 (×2): 60 ug via INTRAVENOUS
  Filled 2016-07-07 (×2): qty 0.3

## 2016-07-07 NOTE — Progress Notes (Signed)
ANTICOAGULATION CONSULT NOTE - Follow Up Consult  Pharmacy Consult for Coumadin Indication: atrial fibrillation  Patient Measurements: Height: 5\' 5"  (165.1 cm) Weight: 242 lb 11.6 oz (110.1 kg) (standin) IBW/kg (Calculated) : 57  Vital Signs: Temp: 98 F (36.7 C) (12/18 1256) BP: 172/59 (12/18 1310) Pulse Rate: 60 (12/18 1310)  Labs:  Recent Labs  07/05/16 0215 07/06/16 0320 07/07/16 0240  HGB 11.1* 10.6* 10.4*  HCT 35.8* 32.6* 32.6*  PLT 265 274 248  LABPROT 29.3* 41.0* 39.4*  INR 2.71 4.15* 3.93  CREATININE 4.89*  --  9.08*    Estimated Creatinine Clearance: 6.9 mL/min (by C-G formula based on SCr of 9.08 mg/dL (H)).  Assessment:  74 yoF on coumadin PTA for hx Afib.  INR remains supratherapeutic this morning at 3.93.  Hgb stable, no S/Sx bleeding noted. Patient is on Aranesp with last dose on 12/12- next due 12/20 now that back on MWF HD schedule.  PTA dose: 5mg  daily except 7.5mg  on Wednesdays and Fridays per anti-coag clinic  Hgb 10.4, plts 248  Goal of Therapy:  INR 2-3 Monitor platelets by anticoagulation protocol: Yes   Plan:  -Hold Coumadin tonight -Daily PT/INR -Monitor CBC, s/sx bleeding  Jane [name] D. Kayle Correa, PharmD, BCPS Clinical Pharmacist Pager: [phone] 07/07/2016 1:23 PM

## 2016-07-07 NOTE — Progress Notes (Signed)
Assessment: 1 Acute diverticulitis/ microperforation - abd better 2  ESRD HD mwf, HD today 3 Volume: looks a bit dry, keep even on HD 4 HTN - BP's soft, under dry wt 5 Atypical CP - trops neg, no further w/u per cards 6 Hx Afib - in NSR, has been "regular" since DCCV 6 yrs ago ([facility]), on amio/ coreg/ coumadin  Subjective: Interval History: Abd better  Objective: Vital signs in last 24 hours: Temp:  [98 F (36.7 C)-98.6 F (37 C)] 98 F (36.7 C) (12/18 1256) Pulse Rate:  [57-66] 59 (12/18 1330) Resp:  [16-20] 18 (12/18 1256) BP: (118-172)/(44-67) 149/65 (12/18 1330) SpO2:  [96 %-98 %] 98 % (12/18 0500) Weight:  [109.6 kg (241 lb 9.6 oz)-110.1 kg (242 lb 11.6 oz)] 110.1 kg (242 lb 11.6 oz) (12/18 1256) Weight change: 1.043 kg (2 lb 4.8 oz)  Intake/Output from previous day: 12/17 0701 - 12/18 0700 In: 243 [P.O.:240] Out: 2 [Stool:2] Intake/Output this shift: Total I/O In: 120 [P.O.:120] Out: -   General appearance: alert and cooperative Resp: clear to auscultation bilaterally Chest wall: no tenderness Cardio: regular rate and rhythm, S1, S2 normal, no murmur, click, rub or gallop Extremities: extremities normal, atraumatic, no cyanosis or edema  Abd: NT soft  Lab Results:  Recent Labs  07/06/16 0320 07/07/16 0240  WBC 5.3 4.9  HGB 10.6* 10.4*  HCT 32.6* 32.6*  PLT 274 248   BMET:  Recent Labs  07/05/16 0215 07/07/16 0240  NA 133* 130*  K 3.9 3.6  CL 93* 95*  CO2 24 23  GLUCOSE 57* 91  BUN 8 16  CREATININE 4.89* 9.08*  CALCIUM 8.0* 7.6*   No results for input(s): PTH in the last 72 hours. Iron Studies: No results for input(s): IRON, TIBC, TRANSFERRIN, FERRITIN in the last 72 hours. Studies/Results: No results found.  Scheduled: . amiodarone  200 mg Oral Daily  . cinacalcet  30 mg Oral Q supper  . [START ON 07/09/2016] darbepoetin (ARANESP) injection - DIALYSIS  60 mcg Intravenous Q Wed-HD  . docusate sodium  200 mg Oral BID  . heparin  2,000  Units Dialysis Once in dialysis  . piperacillin-tazobactam (ZOSYN)  IV  3.375 g Intravenous Q12H  . sodium chloride flush  3 mL Intravenous Q12H  . Warfarin - Pharmacist Dosing Inpatient   Does not apply q1800      LOS: 11 days   Keliah Harned C 07/07/2016,1:52 PM

## 2016-07-07 NOTE — Plan of Care (Signed)
Problem: Education: Goal: Knowledge of [facility] Education information/materials will improve Outcome: Progressing Patient aware of plan of care.  Patient complained of left lower quadrant tenderness upon palpation thus far this shift, no nausea and/or vomiting.  RN provided medication education on medications administered thus far this shift.  Patient stated understanding.

## 2016-07-07 NOTE — Progress Notes (Addendum)
PROGRESS NOTE  Jane [name] XBD:[id number] DOB: 1945-08-29 DOA: 06/25/2016 PCP: [facility]   LOS: 11 days   Brief Narrative: 70 y.o.BF PMHx ESRD on HD M/W/F, A-Fib on warfarin, HTN, and anemiaWho presents with chest pain for 1 day and abdominal pain for 3 months. Regarding chest pain, this was a new complaint today. The patient went to HD, and during her session, developed a sharp, severe, constant pain under her right breast associated with shortness of breath and nausea. She finished her treatment, then the HD nurse administered O2 and nitroglycerin, which caused her pain to subside, and recommended she go to the ER.  Assessment & Plan: Principal Problem:   Diverticulitis of large intestine with perforation Active Problems:   ESRD on hemodialysis (HCC)   Paroxysmal atrial fibrillation (HCC)   Essential hypertension   Anemia in other chronic diseases classified elsewhere   Chest pain with moderate risk for cardiac etiology  Diverticulitis with perforation - repeat CT scan 12/14 with "increased amount of gas seen in adjacent peritoneal fat compared to prior exam, consistent with micro perforation and possible developing absces" - she was initially on Cipro flagyl but now on Zosyn per surgery  - Patient reports return of her pain with soft diet, it appears that she may need surgery after all, appreciate general surgery consultation.  ESRD on HD - Patient clinically continues to be euvolemic. - HD per nephrology, to have hemodialysis today  HTN - Patient's BP high this morning, HD today - Continue Amiodarone 200 mg daily - continue Coreg  Paroxysmal atrial fibrillation - Currently in NSR - continue Coumadin  Anemia of chronic Renal disease - No signs of obstructive bleed - Stable   DVT prophylaxis: Coumadin Code Status: Full Family Communication: no family bedside Disposition Plan: home when ready   Consultants:   General surgery  Cardiology    Nephrology   Procedures:   None   Antimicrobials:  Zosyn 12/14 >>   Subjective: - complains of LLQ pain after eating this morning   Objective: Vitals:   07/06/16 2100 07/07/16 0500 07/07/16 1256 07/07/16 1310  BP: (!) 121/58 (!) 118/44 (!) 171/67 (!) 172/59  Pulse: (!) 57 60 66 60  Resp: 18 20 18    Temp: 98.2 F (36.8 C) 98.2 F (36.8 C) 98 F (36.7 C)   TempSrc:      SpO2: 96% 98%    Weight:  109.6 kg (241 lb 9.6 oz) 110.1 kg (242 lb 11.6 oz)   Height:        Intake/Output Summary (Last 24 hours) at 07/07/16 1327 Last data filed at 07/07/16 0809  Gross per 24 hour  Intake              363 ml  Output                2 ml  Net              361 ml   Filed Weights   07/06/16 0500 07/07/16 0500 07/07/16 1256  Weight: 108.5 kg (239 lb 4.8 oz) 109.6 kg (241 lb 9.6 oz) 110.1 kg (242 lb 11.6 oz)    Examination: Constitutional: NAD Vitals:   07/06/16 2100 07/07/16 0500 07/07/16 1256 07/07/16 1310  BP: (!) 121/58 (!) 118/44 (!) 171/67 (!) 172/59  Pulse: (!) 57 60 66 60  Resp: 18 20 18    Temp: 98.2 F (36.8 C) 98.2 F (36.8 C) 98 F (36.7 C)   TempSrc:  SpO2: 96% 98%    Weight:  109.6 kg (241 lb 9.6 oz) 110.1 kg (242 lb 11.6 oz)   Height:       Respiratory: clear to auscultation bilaterally, no wheezing, no crackles.  Cardiovascular: Regular rate and rhythm, no murmurs / rubs / gallops. No LE edema. Abdomen: minimal tenderness LLQ, significantly improved. Bowel sounds positive.  Musculoskeletal: no clubbing / cyanosis.    Data Reviewed: I have personally reviewed following labs and imaging studies  CBC:  Recent Labs Lab 07/02/16 1316 07/04/16 0414 07/05/16 0215 07/06/16 0320 07/07/16 0240  WBC 7.8 6.5 6.2 5.3 4.9  HGB 11.8* 10.3* 11.1* 10.6* 10.4*  HCT 37.2 32.4* 35.8* 32.6* 32.6*  MCV 91.9 91.5 92.0 90.3 88.8  PLT 212 238 265 274 585   Basic Metabolic Panel:  Recent Labs Lab 07/01/16 0815 07/04/16 0414 07/05/16 0215 07/07/16 0240   NA 131* 130* 133* 130*  K 4.1 4.1 3.9 3.6  CL 95* 92* 93* 95*  CO2 26 28 24 23   GLUCOSE 104* 65 57* 91  BUN 41* 15 8 16   CREATININE 11.53* 7.40* 4.89* 9.08*  CALCIUM 7.9* 7.8* 8.0* 7.6*  PHOS 6.0*  --  4.6 5.5*   GFR: Estimated Creatinine Clearance: 6.9 mL/min (by C-G formula based on SCr of 9.08 mg/dL (H)). Liver Function Tests:  Recent Labs Lab 07/01/16 0815 07/05/16 0215 07/07/16 0240  ALBUMIN 3.2* 3.0* 2.8*   No results for input(s): LIPASE, AMYLASE in the last 168 hours. No results for input(s): AMMONIA in the last 168 hours. Coagulation Profile:  Recent Labs Lab 07/03/16 0236 07/04/16 0414 07/05/16 0215 07/06/16 0320 07/07/16 0240  INR 2.20 2.32 2.71 4.15* 3.93   Cardiac Enzymes: No results for input(s): CKTOTAL, CKMB, CKMBINDEX, TROPONINI in the last 168 hours. BNP (last 3 results) No results for input(s): PROBNP in the last 8760 hours. HbA1C: No results for input(s): HGBA1C in the last 72 hours. CBG:  Recent Labs Lab 07/06/16 0730 07/06/16 1113  GLUCAP 96 115*   Lipid Profile: No results for input(s): CHOL, HDL, LDLCALC, TRIG, CHOLHDL, LDLDIRECT in the last 72 hours. Thyroid Function Tests: No results for input(s): TSH, T4TOTAL, FREET4, T3FREE, THYROIDAB in the last 72 hours. Anemia Panel: No results for input(s): VITAMINB12, FOLATE, FERRITIN, TIBC, IRON, RETICCTPCT in the last 72 hours. Urine analysis:    Component Value Date/Time   COLORURINE AMBER (A) 06/30/2016 0503   APPEARANCEUR HAZY (A) 06/30/2016 0503   LABSPEC 1.020 06/30/2016 0503   PHURINE 5.0 06/30/2016 0503   GLUCOSEU NEGATIVE 06/30/2016 0503   HGBUR NEGATIVE 06/30/2016 0503   BILIRUBINUR SMALL (A) 06/30/2016 0503   KETONESUR NEGATIVE 06/30/2016 0503   PROTEINUR 30 (A) 06/30/2016 0503   UROBILINOGEN 0.2 06/30/2012 2201   NITRITE NEGATIVE 06/30/2016 0503   LEUKOCYTESUR SMALL (A) 06/30/2016 0503   Sepsis Labs: Invalid input(s): PROCALCITONIN, LACTICIDVEN  No results found  for this or any previous visit (from the past 240 hour(s)).  Radiology Studies: No results found.  Scheduled Meds: . amiodarone  200 mg Oral Daily  . cinacalcet  30 mg Oral Q supper  . [START ON 07/09/2016] darbepoetin (ARANESP) injection - DIALYSIS  60 mcg Intravenous Q Wed-HD  . docusate sodium  200 mg Oral BID  . heparin  2,000 Units Dialysis Once in dialysis  . piperacillin-tazobactam (ZOSYN)  IV  3.375 g Intravenous Q12H  . sodium chloride flush  3 mL Intravenous Q12H  . Warfarin - Pharmacist Dosing Inpatient   Does not apply [phone]  Continuous Infusions: . dextrose 5 % and 0.45% NaCl 30 mL/hr at 07/06/16 1449    Marzetta Board, MD, PhD [facility] Pager [phone]  If 7PM-7AM, please contact night-coverage [email or web address] Password [email or web address] 07/07/2016, 1:27 PM

## 2016-07-07 NOTE — Progress Notes (Signed)
S: No acute events. Not eating much. Has a little pain this Am.   Vitals, labs, intake/output, and orders reviewed at this time.  Gen: A&Ox3, no distress  H&N: EOMI, atraumatic, neck supple Chest: unlabored respirations, RRR Abd: soft, mildly tender LLQ, no peritoneal signs. nondistended Ext: warm, no edema Neuro: grossly normal  Lines/tubes/drains: PIV  A/P:  Diverticulitis. Labs and vitals normal. Still having some pain. Continue IV abx. If pain worsens will need to be NPO/TNA.    [name], MD [facility], [place] Pager [phone]

## 2016-07-07 NOTE — Progress Notes (Signed)
Dialysis treatment completed.  500 mL ultrafiltrated and net fluid removal 0 mL.    Patient status unchanged. Lung sounds diminished to ausculation in all fields. No edema. Cardiac: NSR.  Disconnected lines and removed needles.  Pressure held for 10 minutes and band aid/gauze dressing applied.  Report given to bedside RN, Sheppard Evens.

## 2016-07-07 NOTE — Plan of Care (Signed)
Problem: Nutrition: Goal: Adequate nutrition will be maintained Outcome: Progressing Tolerating solids with mild discomfort with meals

## 2016-07-07 NOTE — Progress Notes (Signed)
Patient arrived to unit per bed.  Reviewed treatment plan and this RN agrees.  Report received from bedside RN, Sheppard Evens.  Consent verified.  Patient A & o x 4. Lung sounds clear to ausculation in all fields. No edema. Cardiac: NSR.  Prepped RLAVF with alcohol and cannulated with two button hole needles.  Pulsation of blood noted.  Flushed access well with saline per protocol.  Connected and secured lines and initiated tx at 1310.  UF goal of 500 mL and net fluid removal of 0 mL.  Will continue to monitor.

## 2016-07-08 LAB — RENAL FUNCTION PANEL
Albumin: 2.8 g/dL — ABNORMAL LOW (ref 3.5–5.0)
Anion gap: 12 (ref 5–15)
BUN: 5 mg/dL — ABNORMAL LOW (ref 6–20)
CALCIUM: 7.6 mg/dL — AB (ref 8.9–10.3)
CO2: 26 mmol/L (ref 22–32)
CREATININE: 5.18 mg/dL — AB (ref 0.44–1.00)
Chloride: 95 mmol/L — ABNORMAL LOW (ref 101–111)
GFR, EST AFRICAN AMERICAN: 9 mL/min — AB (ref >60)
GFR, EST NON AFRICAN AMERICAN: 8 mL/min — AB (ref >60)
Glucose, Bld: 92 mg/dL (ref 65–99)
Phosphorus: 3.5 mg/dL (ref 2.5–4.6)
Potassium: 3.8 mmol/L (ref 3.5–5.1)
SODIUM: 133 mmol/L — AB (ref 135–145)

## 2016-07-08 LAB — CBC
HCT: 32.1 % — ABNORMAL LOW (ref 36.0–46.0)
Hemoglobin: 10.4 g/dL — ABNORMAL LOW (ref 12.0–15.0)
MCH: 28.7 pg (ref 26.0–34.0)
MCHC: 32.4 g/dL (ref 30.0–36.0)
MCV: 88.4 fL (ref 78.0–100.0)
PLATELETS: 234 10*3/uL (ref 150–400)
RBC: 3.63 MIL/uL — AB (ref 3.87–5.11)
RDW: 17.3 % — ABNORMAL HIGH (ref 11.5–15.5)
WBC: 6 10*3/uL (ref 4.0–10.5)

## 2016-07-08 LAB — PROTIME-INR
INR: 3.53
Prothrombin Time: 36.2 seconds — ABNORMAL HIGH (ref 11.4–15.2)

## 2016-07-08 MED ORDER — AMOXICILLIN-POT CLAVULANATE 875-125 MG PO TABS: 1.0000 | ORAL_TABLET | Freq: Two times a day (BID) | ORAL | Status: DC

## 2016-07-08 MED ORDER — PIPERACILLIN-TAZOBACTAM 3.375 G IVPB: 3.3750 g | Freq: Two times a day (BID) | INTRAVENOUS | Status: DC

## 2016-07-08 MED ORDER — AMOXICILLIN-POT CLAVULANATE 875-125 MG PO TABS
1.0000 | ORAL_TABLET | Freq: Once | ORAL | Status: AC
  Administered 2016-07-08: 1 via ORAL
  Filled 2016-07-08: qty 1

## 2016-07-08 NOTE — Progress Notes (Signed)
ANTICOAGULATION CONSULT NOTE - Follow Up Consult  Pharmacy Consult for Coumadin Indication: atrial fibrillation  Patient Measurements: Height: 5\' 5"  (165.1 cm) Weight: 241 lb 8 oz (109.5 kg) (241.5 LBS) IBW/kg (Calculated) : 57  Vital Signs: Temp: 97.8 F (36.6 C) (12/19 0458) Temp Source: Oral (12/19 0458) BP: 154/49 (12/19 0458) Pulse Rate: 63 (12/19 0458)  Labs:  Recent Labs  07/06/16 0320 07/07/16 0240 07/08/16 0345  HGB 10.6* 10.4* 10.4*  HCT 32.6* 32.6* 32.1*  PLT 274 248 234  LABPROT 41.0* 39.4* 36.2*  INR 4.15* 3.93 3.53  CREATININE  --  9.08* 5.18*    Estimated Creatinine Clearance: 12.1 mL/min (by C-G formula based on SCr of 5.18 mg/dL (H)).  Assessment:  47 yoF on coumadin PTA for hx Afib.  INR remains supratherapeutic today at 3.53.  CBC stable, no S/Sx bleeding noted. Patient is on Aranesp with last dose on 12/12- next due 12/20 now that back on MWF HD schedule.  PTA Coumadin dose: 5mg  daily except 7.5mg  on Wednesdays and Fridays per anti-coag clinic   Goal of Therapy:  INR 2-3 Monitor platelets by anticoagulation protocol: Yes   Plan:   Hold Coumadin again today.  Daily PT/INR.  Arty Baumgartner, [place] Pager: [phone] 07/08/2016 12:48 PM

## 2016-07-08 NOTE — Progress Notes (Signed)
PROGRESS NOTE  Jane [name] ELF:[id number] DOB: 02-09-1946 DOA: 06/25/2016 PCP: [place]   LOS: 12 days   Brief Narrative: 70 y.o.BF PMHx ESRD on HD M/W/F, A-Fib on warfarin, HTN, and anemiaWho presents with chest pain for 1 day and abdominal pain for 3 months. Regarding chest pain, this was a new complaint today. The patient went to HD, and during her session, developed a sharp, severe, constant pain under her right breast associated with shortness of breath and nausea. She finished her treatment, then the HD nurse administered O2 and nitroglycerin, which caused her pain to subside, and recommended she go to the ER.  Patient was initially placed on antibiotics with ciprofloxacin and metronidazole, she was very slow to improve then failed advancement of her diet, general surgery was consulted. Her antibodies were transitioned to Zosyn, and another attempt was made to advance her diet as she was appearing to improve clinically over the weekend. However when she got to soft diet her pain returned. Will place patient back on clear liquid diet on 12/19 and repeat the CT scan of the abdomen and pelvis on 12/20   Assessment & Plan: Principal Problem:   Diverticulitis of large intestine with perforation Active Problems:   ESRD on hemodialysis (HCC)   Paroxysmal atrial fibrillation (HCC)   Essential hypertension   Anemia in other chronic diseases classified elsewhere   Chest pain with moderate risk for cardiac etiology  Diverticulitis with perforation - repeat CT scan 12/14 with "increased amount of gas seen in adjacent peritoneal fat compared to prior exam, consistent with micro perforation and possible developing absces" - she was initially on Cipro flagyl but now on Zosyn per surgery  - Patient reports return of her pain with soft diet, it appears that she may need surgery after all, appreciate general surgery consultation, d/c Dr. Kae Heller today. She recommends to repeat a CT  scan tomorrow  ESRD on HD - Patient clinically continues to be euvolemic. - HD per nephrology, she is on MWF schedule  HTN - Continue Amiodarone 200 mg daily - continue Coreg - 154/49 today  Paroxysmal atrial fibrillation - Currently in NSR - continue Coumadin  Anemia of chronic Renal disease - No signs of obstructive bleed - Stable   DVT prophylaxis: Coumadin Code Status: Full Family Communication: no family bedside Disposition Plan: home when ready   Consultants:   General surgery  Cardiology   Nephrology   Procedures:   None   Antimicrobials:  Zosyn 12/14 >>   Subjective: - complains of LLQ pain after eating Last night as well as this morning  Objective: Vitals:   07/07/16 1700 07/07/16 1752 07/07/16 2101 07/08/16 0458  BP: (!) 176/49 (!) 163/57 136/60 (!) 154/49  Pulse: 66 73 61 63  Resp:  18  (!) 22  Temp:  98.1 F (36.7 C) 98.4 F (36.9 C) 97.8 F (36.6 C)  TempSrc:  Oral Oral Oral  SpO2:  100% 93% 95%  Weight:    109.5 kg (241 lb 8 oz)  Height:        Intake/Output Summary (Last 24 hours) at 07/08/16 1402 Last data filed at 07/08/16 0851  Gross per 24 hour  Intake              735 ml  Output              100 ml  Net              635 ml  Filed Weights   07/07/16 1256 07/07/16 1655 07/08/16 0458  Weight: 110.1 kg (242 lb 11.6 oz) 110.1 kg (242 lb 11.6 oz) 109.5 kg (241 lb 8 oz)    Examination: Constitutional: NAD Vitals:   07/07/16 1700 07/07/16 1752 07/07/16 2101 07/08/16 0458  BP: (!) 176/49 (!) 163/57 136/60 (!) 154/49  Pulse: 66 73 61 63  Resp:  18  (!) 22  Temp:  98.1 F (36.7 C) 98.4 F (36.9 C) 97.8 F (36.6 C)  TempSrc:  Oral Oral Oral  SpO2:  100% 93% 95%  Weight:    109.5 kg (241 lb 8 oz)  Height:       Respiratory: clear to auscultation bilaterally, no wheezing, no crackles.  Cardiovascular: Regular rate and rhythm, no murmurs / rubs / gallops. No LE edema. Abdomen: minimal tenderness LLQ, significantly  improved. Bowel sounds positive.  Musculoskeletal: no clubbing / cyanosis. Neuro: non focal, strength equal     Data Reviewed: I have personally reviewed following labs and imaging studies  CBC:  Recent Labs Lab 07/04/16 0414 07/05/16 0215 07/06/16 0320 07/07/16 0240 07/08/16 0345  WBC 6.5 6.2 5.3 4.9 6.0  HGB 10.3* 11.1* 10.6* 10.4* 10.4*  HCT 32.4* 35.8* 32.6* 32.6* 32.1*  MCV 91.5 92.0 90.3 88.8 88.4  PLT 238 265 274 248 761   Basic Metabolic Panel:  Recent Labs Lab 07/04/16 0414 07/05/16 0215 07/07/16 0240 07/08/16 0345  NA 130* 133* 130* 133*  K 4.1 3.9 3.6 3.8  CL 92* 93* 95* 95*  CO2 28 24 23 26   GLUCOSE 65 57* 91 92  BUN 15 8 16  5*  CREATININE 7.40* 4.89* 9.08* 5.18*  CALCIUM 7.8* 8.0* 7.6* 7.6*  PHOS  --  4.6 5.5* 3.5   GFR: Estimated Creatinine Clearance: 12.1 mL/min (by C-G formula based on SCr of 5.18 mg/dL (H)). Liver Function Tests:  Recent Labs Lab 07/05/16 0215 07/07/16 0240 07/08/16 0345  ALBUMIN 3.0* 2.8* 2.8*   No results for input(s): LIPASE, AMYLASE in the last 168 hours. No results for input(s): AMMONIA in the last 168 hours. Coagulation Profile:  Recent Labs Lab 07/04/16 0414 07/05/16 0215 07/06/16 0320 07/07/16 0240 07/08/16 0345  INR 2.32 2.71 4.15* 3.93 3.53   Cardiac Enzymes: No results for input(s): CKTOTAL, CKMB, CKMBINDEX, TROPONINI in the last 168 hours. BNP (last 3 results) No results for input(s): PROBNP in the last 8760 hours. HbA1C: No results for input(s): HGBA1C in the last 72 hours. CBG:  Recent Labs Lab 07/06/16 0730 07/06/16 1113  GLUCAP 96 115*   Lipid Profile: No results for input(s): CHOL, HDL, LDLCALC, TRIG, CHOLHDL, LDLDIRECT in the last 72 hours. Thyroid Function Tests: No results for input(s): TSH, T4TOTAL, FREET4, T3FREE, THYROIDAB in the last 72 hours. Anemia Panel: No results for input(s): VITAMINB12, FOLATE, FERRITIN, TIBC, IRON, RETICCTPCT in the last 72 hours. Urine analysis:      Component Value Date/Time   COLORURINE AMBER (A) 06/30/2016 0503   APPEARANCEUR HAZY (A) 06/30/2016 0503   LABSPEC 1.020 06/30/2016 0503   PHURINE 5.0 06/30/2016 0503   GLUCOSEU NEGATIVE 06/30/2016 0503   HGBUR NEGATIVE 06/30/2016 0503   BILIRUBINUR SMALL (A) 06/30/2016 0503   KETONESUR NEGATIVE 06/30/2016 0503   PROTEINUR 30 (A) 06/30/2016 0503   UROBILINOGEN 0.2 06/30/2012 2201   NITRITE NEGATIVE 06/30/2016 0503   LEUKOCYTESUR SMALL (A) 06/30/2016 0503   Sepsis Labs: Invalid input(s): PROCALCITONIN, LACTICIDVEN  No results found for this or any previous visit (from the past 240 hour(s)).  Radiology Studies: No results found.  Scheduled Meds: . amiodarone  200 mg Oral Daily  . cinacalcet  30 mg Oral Q supper  . [START ON 07/09/2016] darbepoetin (ARANESP) injection - DIALYSIS  60 mcg Intravenous Q Wed-HD  . docusate sodium  200 mg Oral BID  . nystatin  5 mL Oral QID  . piperacillin-tazobactam (ZOSYN)  IV  3.375 g Intravenous Q12H  . sodium chloride flush  3 mL Intravenous Q12H  . Warfarin - Pharmacist Dosing Inpatient   Does not apply q1800   Continuous Infusions:   Marzetta Board, MD, PhD [facility] Pager [phone]  If 7PM-7AM, please contact night-coverage [email or web address] Password [email or web address] 07/08/2016, 2:02 PM

## 2016-07-08 NOTE — Progress Notes (Signed)
Dear Doctor: Cruzita Lederer  This patient has been identified as a candidate for PICC for the following reason (s): poor veins/poor circulatory system (CHF, COPD, emphysema, diabetes, steroid use, IV drug abuse, etc.) and restarts due to phlebitis and infiltration in 24 hours If you agree, please write an order for the indicated device. For any questions contact the Vascular Access Team at [phone] if no answer, please leave a message. Aware that patient is  renal patient.  Would recommend a tunneled PICC with IR.    Thank you for supporting the early vascular access assessment program.  Vascular Access Specialist Virgilio Belling, RN

## 2016-07-08 NOTE — Progress Notes (Signed)
S: No acute events. Eating fine, only has pain with bowel movements.   Vitals, labs, intake/output, and orders reviewed at this time.  Gen: A&Ox3, no distress  H&N: EOMI, atraumatic, neck supple Chest: unlabored respirations, RRR Abd: soft, mildly tender LLQ, no peritoneal signs. nondistended Ext: warm, no edema Neuro: grossly normal  Lines/tubes/drains: PIV  A/P:  Diverticulitis. Labs and vitals normal. Still having some pain. Continue IV abx. If pain worsens will need to be NPO/TNA. May need repeat CT later this week to eval for drainable abscess.   [name], MD [facility], [place] Pager [phone]

## 2016-07-08 NOTE — Progress Notes (Signed)
Assessment: 1 Acute diverticulitis/ microperforation- abd better 2 ESRD HD mwf, HD Wed 3 Volume: looks a bit dry, keep even on HD 4 HTN - BP's soft, under dry wt 5 Atypical CP - trops neg, no further w/u per cards 6 Hx Afib - in NSR, since DCCV 6 yrs ago ([facility]), on amio/ coreg/ coumadin  Subjective: Interval History: Eating solid food, post evac abd discomfort persists  Objective: Vital signs in last 24 hours: Temp:  [97.8 F (36.6 C)-98.4 F (36.9 C)] 97.8 F (36.6 C) (12/19 0458) Pulse Rate:  [59-73] 63 (12/19 0458) Resp:  [16-22] 22 (12/19 0458) BP: (136-177)/(46-112) 154/49 (12/19 0458) SpO2:  [93 %-100 %] 95 % (12/19 0458) Weight:  [109.5 kg (241 lb 8 oz)-110.1 kg (242 lb 11.6 oz)] 109.5 kg (241 lb 8 oz) (12/19 0458) Weight change: 0.511 kg (1 lb 2 oz)  Intake/Output from previous day: 12/18 0701 - 12/19 0700 In: 680 [P.O.:480; IV Piggyback:200] Out: 100 [Urine:100] Intake/Output this shift: Total I/O In: 175 [P.O.:175] Out: -   General appearance: alert and cooperative Resp: clear to auscultation bilaterally Chest wall: no tenderness Cardio: regular rate and rhythm, S1, S2 normal, no murmur, click, rub or gallop GI: soft, non-tender; bowel sounds normal; no masses,  no organomegaly  Lab Results:  Recent Labs  07/07/16 0240 07/08/16 0345  WBC 4.9 6.0  HGB 10.4* 10.4*  HCT 32.6* 32.1*  PLT 248 234   BMET:  Recent Labs  07/07/16 0240 07/08/16 0345  NA 130* 133*  K 3.6 3.8  CL 95* 95*  CO2 23 26  GLUCOSE 91 92  BUN 16 5*  CREATININE 9.08* 5.18*  CALCIUM 7.6* 7.6*   No results for input(s): PTH in the last 72 hours. Iron Studies: No results for input(s): IRON, TIBC, TRANSFERRIN, FERRITIN in the last 72 hours. Studies/Results: No results found.  Scheduled: . amiodarone  200 mg Oral Daily  . cinacalcet  30 mg Oral Q supper  . [START ON 07/09/2016] darbepoetin (ARANESP) injection - DIALYSIS  60 mcg Intravenous Q Wed-HD  . docusate sodium   200 mg Oral BID  . nystatin  5 mL Oral QID  . piperacillin-tazobactam (ZOSYN)  IV  3.375 g Intravenous Q12H  . sodium chloride flush  3 mL Intravenous Q12H  . Warfarin - Pharmacist Dosing Inpatient   Does not apply q1800      LOS: 12 days   Ulis Kaps C 07/08/2016,12:56 PM

## 2016-07-09 ENCOUNTER — Encounter (HOSPITAL_COMMUNITY): Payer: Self-pay | Admitting: Interventional Radiology

## 2016-07-09 ENCOUNTER — Inpatient Hospital Stay (HOSPITAL_COMMUNITY): Payer: Medicare Other

## 2016-07-09 HISTORY — PX: IR GENERIC HISTORICAL: IMG1180011

## 2016-07-09 LAB — CBC
HCT: 32 % — ABNORMAL LOW (ref 36.0–46.0)
Hemoglobin: 10.3 g/dL — ABNORMAL LOW (ref 12.0–15.0)
MCH: 28.9 pg (ref 26.0–34.0)
MCHC: 32.2 g/dL (ref 30.0–36.0)
MCV: 89.6 fL (ref 78.0–100.0)
Platelets: 242 10*3/uL (ref 150–400)
RBC: 3.57 MIL/uL — ABNORMAL LOW (ref 3.87–5.11)
RDW: 17.3 % — AB (ref 11.5–15.5)
WBC: 5.6 10*3/uL (ref 4.0–10.5)

## 2016-07-09 LAB — RENAL FUNCTION PANEL
ALBUMIN: 2.8 g/dL — AB (ref 3.5–5.0)
Anion gap: 13 (ref 5–15)
BUN: 12 mg/dL (ref 6–20)
CHLORIDE: 93 mmol/L — AB (ref 101–111)
CO2: 25 mmol/L (ref 22–32)
CREATININE: 7.25 mg/dL — AB (ref 0.44–1.00)
Calcium: 7.5 mg/dL — ABNORMAL LOW (ref 8.9–10.3)
GFR calc Af Amer: 6 mL/min — ABNORMAL LOW (ref >60)
GFR, EST NON AFRICAN AMERICAN: 5 mL/min — AB (ref >60)
GLUCOSE: 81 mg/dL (ref 65–99)
Phosphorus: 4.7 mg/dL — ABNORMAL HIGH (ref 2.5–4.6)
Potassium: 3.6 mmol/L (ref 3.5–5.1)
Sodium: 131 mmol/L — ABNORMAL LOW (ref 135–145)

## 2016-07-09 LAB — PROTIME-INR
INR: 3.31
Prothrombin Time: 34.4 seconds — ABNORMAL HIGH (ref 11.4–15.2)

## 2016-07-09 LAB — HEPATITIS B SURFACE ANTIGEN: HEP B S AG: NEGATIVE

## 2016-07-09 MED ORDER — LIDOCAINE HCL (PF) 2 % IJ SOLN
INTRAMUSCULAR | Status: AC
  Filled 2016-07-09: qty 10

## 2016-07-09 MED ORDER — HEPARIN SODIUM (PORCINE) 1000 UNIT/ML DIALYSIS: 1000.0000 [IU] | INTRAMUSCULAR | Status: DC | PRN

## 2016-07-09 MED ORDER — BOOST / RESOURCE BREEZE PO LIQD
1.0000 | Freq: Three times a day (TID) | ORAL | Status: DC
  Administered 2016-07-10 (×2): 1 via ORAL

## 2016-07-09 MED ORDER — SODIUM CHLORIDE 0.9 % IV SOLN: 100.0000 mL | INTRAVENOUS | Status: DC | PRN

## 2016-07-09 MED ORDER — LIDOCAINE-PRILOCAINE 2.5-2.5 % EX CREA: 1.0000 "application " | TOPICAL_CREAM | CUTANEOUS | Status: DC | PRN

## 2016-07-09 MED ORDER — IOPAMIDOL (ISOVUE-300) INJECTION 61%
INTRAVENOUS | Status: AC
  Filled 2016-07-09: qty 100

## 2016-07-09 MED ORDER — AMOXICILLIN-POT CLAVULANATE 500-125 MG PO TABS
1.0000 | ORAL_TABLET | Freq: Every day | ORAL | Status: DC
  Administered 2016-07-09 – 2016-07-10 (×2): 500 mg via ORAL
  Filled 2016-07-09 (×2): qty 1

## 2016-07-09 MED ORDER — IOPAMIDOL (ISOVUE-300) INJECTION 61%
INTRAVENOUS | Status: AC
  Filled 2016-07-09: qty 30

## 2016-07-09 MED ORDER — LIDOCAINE HCL 1 % IJ SOLN
INTRAMUSCULAR | Status: DC | PRN
  Administered 2016-07-09: 10 mL

## 2016-07-09 MED ORDER — DARBEPOETIN ALFA 60 MCG/0.3ML IJ SOSY
PREFILLED_SYRINGE | INTRAMUSCULAR | Status: AC
  Administered 2016-07-09: 60 ug via INTRAVENOUS
  Filled 2016-07-09: qty 0.3

## 2016-07-09 MED ORDER — AMOXICILLIN-POT CLAVULANATE 875-125 MG PO TABS: 1.0000 | ORAL_TABLET | Freq: Two times a day (BID) | ORAL | Status: DC

## 2016-07-09 MED ORDER — ALTEPLASE 2 MG IJ SOLR: 2.0000 mg | Freq: Once | INTRAMUSCULAR | Status: DC | PRN

## 2016-07-09 MED ORDER — HEPARIN SODIUM (PORCINE) 1000 UNIT/ML DIALYSIS: 20.0000 [IU]/kg | INTRAMUSCULAR | Status: DC | PRN

## 2016-07-09 MED ORDER — IOPAMIDOL (ISOVUE-300) INJECTION 61%
INTRAVENOUS | Status: AC
Start: 2016-07-09 — End: 2016-07-09
  Administered 2016-07-09: 100 mL
  Filled 2016-07-09: qty 100

## 2016-07-09 MED ORDER — PENTAFLUOROPROP-TETRAFLUOROETH EX AERO: 1.0000 "application " | INHALATION_SPRAY | CUTANEOUS | Status: DC | PRN

## 2016-07-09 MED ORDER — LIDOCAINE HCL (PF) 1 % IJ SOLN: 5.0000 mL | INTRAMUSCULAR | Status: DC | PRN

## 2016-07-09 NOTE — Procedures (Signed)
Tol HD.  No hemodynamic issues. Goal 2000cc, BFR 400.  Abd tender LLQ, No access diff. Jane [name] C

## 2016-07-09 NOTE — Procedures (Signed)
R IJ DL CVC under fluoro and [place] No complication No blood loss. See complete dictation in [facility].

## 2016-07-09 NOTE — Progress Notes (Signed)
ANTICOAGULATION CONSULT NOTE - Follow Up Consult  Pharmacy Consult for Coumadin Indication: atrial fibrillation  Patient Measurements: Height: 5\' 5"  (165.1 cm) Weight: 241 lb 2.9 oz (109.4 kg) IBW/kg (Calculated) : 57  Vital Signs: Temp: 98.2 F (36.8 C) (12/20 1244) Temp Source: Oral (12/20 1244) BP: 152/56 (12/20 1244) Pulse Rate: 71 (12/20 1244)  Labs:  Recent Labs  07/07/16 0240 07/08/16 0345 07/09/16 0334 07/09/16 0730  HGB 10.4* 10.4*  --  10.3*  HCT 32.6* 32.1*  --  32.0*  PLT 248 234  --  242  LABPROT 39.4* 36.2* 34.4*  --   INR 3.93 3.53 3.31  --   CREATININE 9.08* 5.18*  --  7.25*    Estimated Creatinine Clearance: 8.6 mL/min (by C-G formula based on SCr of 7.25 mg/dL (H)).  Assessment:  74 yoF on coumadin PTA for hx Afib.  INR remains supratherapeutic today at 3.31. Slow decline.  CBC stable, no S/Sx bleeding noted. Patient is on Aranesp 60 mcg weekly, given this morning in HD.  PTA Coumadin dose: 5mg  daily except 7.5mg  on Wednesdays and Fridays per anti-coag clinic  Goal of Therapy:  INR 2-3 Monitor platelets by anticoagulation protocol: Yes   Plan:   Hold Coumadin again today.  Daily PT/INR.  Arty Baumgartner, [place] Pager: [phone] 07/09/2016 3:15 PM

## 2016-07-09 NOTE — Progress Notes (Signed)
[facility] Progress Note     Subjective: Pt states no abdominal pain at rest or with Bm's. Pt is only having pain with bearing down during a BM. She states as she is bearing down the pain is mild and then progressively worsens to a 10/10 which then requires pain medication. She denies fever, chills, nausea or vomiting.   Objective: Vital signs in last 24 hours: Temp:  [96.9 F (36.1 C)-98.3 F (36.8 C)] 96.9 F (36.1 C) (12/20 0715) Pulse Rate:  [56-66] 63 (12/20 0900) Resp:  [16-19] 16 (12/20 0900) BP: (117-165)/(39-84) 153/84 (12/20 0900) SpO2:  [93 %-100 %] 100 % (12/20 0715) Weight:  [245 lb 2.4 oz (111.2 kg)] 245 lb 2.4 oz (111.2 kg) (12/20 0715) Last BM Date: 07/08/16  Intake/Output from previous day: 12/19 0701 - 12/20 0700 In: 1105 [P.O.:1105] Out: 100 [Urine:100] Intake/Output this shift: No intake/output data recorded.  PE: General: pleasant, WD/WN femalewho is laying in bed in NAD, cooperative  Heart: regular, rate, and rhythm. Mild systolic murmur appreciated, normal S1 and S2,  Lungs: rate normal, effort nonlabored Abd: soft, ND, +BS, very mild TTP to LLQ (3/10) Skin: warm and dry with no rashes noted  Lab Results:   Recent Labs  07/08/16 0345 07/09/16 0730  WBC 6.0 5.6  HGB 10.4* 10.3*  HCT 32.1* 32.0*  PLT 234 242   BMET  Recent Labs  07/08/16 0345 07/09/16 0730  NA 133* 131*  K 3.8 3.6  CL 95* 93*  CO2 26 25  GLUCOSE 92 81  BUN 5* 12  CREATININE 5.18* 7.25*  CALCIUM 7.6* 7.5*   PT/INR  Recent Labs  07/08/16 0345 07/09/16 0334  LABPROT 36.2* 34.4*  INR 3.53 3.31   CMP     Component Value Date/Time   NA 131 (L) 07/09/2016 0730   K 3.6 07/09/2016 0730   CL 93 (L) 07/09/2016 0730   CO2 25 07/09/2016 0730   GLUCOSE 81 07/09/2016 0730   BUN 12 07/09/2016 0730   CREATININE 7.25 (H) 07/09/2016 0730   CALCIUM 7.5 (L) 07/09/2016 0730   PROT 7.0 06/26/2016 0321   ALBUMIN 2.8 (L) 07/09/2016 0730   AST 18 06/26/2016  0321   ALT 12 (L) 06/26/2016 0321   ALKPHOS 105 06/26/2016 0321   BILITOT 0.8 06/26/2016 0321   GFRNONAA 5 (L) 07/09/2016 0730   GFRAA 6 (L) 07/09/2016 0730   Lipase  No results found for: LIPASE     Studies/Results: No results found.  Anti-infectives: Anti-infectives    Start     Dose/Rate Route Frequency Ordered Stop   07/09/16 2200  piperacillin-tazobactam (ZOSYN) IVPB 3.375 g     3.375 g 12.5 mL/hr over 240 Minutes Intravenous Every 12 hours 07/08/16 1704     07/08/16 2200  amoxicillin-clavulanate (AUGMENTIN) 875-125 MG per tablet 1 tablet  Status:  Discontinued     1 tablet Oral Every 12 hours 07/08/16 1650 07/08/16 1704   07/08/16 2200  amoxicillin-clavulanate (AUGMENTIN) 875-125 MG per tablet 1 tablet     1 tablet Oral  Once 07/08/16 1704 07/08/16 2134   07/03/16 1015  piperacillin-tazobactam (ZOSYN) IVPB 3.375 g  Status:  Discontinued     3.375 g 12.5 mL/hr over 240 Minutes Intravenous Every 12 hours 07/03/16 1001 07/08/16 1704   07/01/16 2200  ciprofloxacin (CIPRO) tablet 500 mg  Status:  Discontinued     500 mg Oral Daily at bedtime 07/01/16 1400 07/03/16 0922   06/26/16 2200  ciprofloxacin (CIPRO) IVPB 400  mg  Status:  Discontinued     400 mg 200 mL/hr over 60 Minutes Intravenous Every 24 hours 06/26/16 0035 07/01/16 2111   06/25/16 2200  ciprofloxacin (CIPRO) tablet 500 mg  Status:  Discontinued     500 mg Oral Every 24 hours 06/25/16 2146 06/26/16 0030   06/25/16 2200  metroNIDAZOLE (FLAGYL) tablet 500 mg     500 mg Oral Every 8 hours 06/25/16 2146 07/02/16 2159       Assessment/Plan  Chest pain-resolved, pt seen by cardiology,no further ischemia testing indicated at this time. F/U with cards as outpatient. History of atrial fibrillation- on warfarin, amiodarone and Coreg.  Hypertension-on losartan, furosemide. ESRD on hemodialysis- MWF HD, nephrology following  FEN: clears VTE: SCD's and warfarin ID: Zosyn 12/14>>  A/P:  Diverticulitis. Labs and  vitals normal. Still having some pain while bearing down during a BM. Continue IV abx. If pain worsens will need to be NPO/TNA. Repeat CT today pending to eval for drainable abscess. PICC pending.     LOS: 13 days    Kalman Drape , [facility] 07/09/2016, 9:18 AM Pager: [phone] Consults: [phone] Mon-Fri 7:00 am-4:30 pm Sat-Sun 7:00 am-11:30 am

## 2016-07-09 NOTE — Progress Notes (Signed)
PROGRESS NOTE  Jane [name] LYY:[id number] DOB: 1946-02-29 DOA: 06/25/2016 PCP: [facility]   LOS: 13 days   Brief Narrative: 70 y.o.BF PMHx ESRD on HD M/W/F, A-Fib on warfarin, HTN, and anemiaWho presents with chest pain for 1 day and abdominal pain for 3 months. Regarding chest pain, this was a new complaint today. The patient went to HD, and during her session, developed a sharp, severe, constant pain under her right breast associated with shortness of breath and nausea. She finished her treatment, then the HD nurse administered O2 and nitroglycerin, which caused her pain to subside, and recommended she go to the ER.  Patient was initially placed on antibiotics with ciprofloxacin and metronidazole, she was very slow to improve then failed advancement of her diet, general surgery was consulted. Her antibodies were transitioned to Zosyn, and another attempt was made to advance her diet as she was appearing to improve clinically over the weekend. However when she got to soft diet her pain returned. She was put on clear liquid diet and repeat CT abd and pelvis ordered.   Assessment & Plan: Principal Problem:   Diverticulitis of large intestine with perforation Active Problems:   ESRD on hemodialysis (HCC)   Paroxysmal atrial fibrillation (HCC)   Essential hypertension   Anemia in other chronic diseases classified elsewhere   Chest pain with moderate risk for cardiac etiology  Diverticulitis with perforation - repeat CT scan 12/14 with "increased amount of gas seen in adjacent peritoneal fat compared to prior exam, consistent with micro perforation and possible developing absces".  - she was initially on Cipro flagyl but now on Zosyn per surgery  - Patient reports return of her pain with soft diet, repeat CT abd pelvis ordered.  - she remains afebrile without any leukocytosis.   ESRD on HD - Patient clinically continues to be euvolemic. - HD per nephrology, she is on MWF  schedule   HTN - better controlled.  - continue Coreg   Paroxysmal atrial fibrillation - Currently in NSR - continue Coumadin and amiodarone.  - rate controlled.  - supra therapeutic INR.    Hyponatremia: probably from ESRD.    Anemia of chronic Renal disease - No signs of bleeding.  Stable around 10.    DVT prophylaxis: Coumadin Code Status: Full Family Communication: no family bedside Disposition Plan: home when ready   Consultants:   General surgery  Cardiology   Nephrology   Procedures:   None   Antimicrobials:  Zosyn 12/14 >>   Subjective: Persistent LLQ pain.   Objective: Vitals:   07/09/16 1100 07/09/16 1125 07/09/16 1145 07/09/16 1244  BP: (!) 153/61 (!) 158/61 (!) 150/52 (!) 152/56  Pulse: 65 63 64 71  Resp: 17 13 18 16   Temp:   98 F (36.7 C) 98.2 F (36.8 C)  TempSrc:   Oral Oral  SpO2:   100% 100%  Weight:   109.4 kg (241 lb 2.9 oz)   Height:        Intake/Output Summary (Last 24 hours) at 07/09/16 1612 Last data filed at 07/09/16 1145  Gross per 24 hour  Intake              630 ml  Output             1600 ml  Net             -970 ml   Filed Weights   07/08/16 0458 07/09/16 0715 07/09/16 1145  Weight: 109.5 kg (  241 lb 8 oz) 111.2 kg (245 lb 2.4 oz) 109.4 kg (241 lb 2.9 oz)    Examination: Constitutional: NAD Vitals:   07/09/16 1100 07/09/16 1125 07/09/16 1145 07/09/16 1244  BP: (!) 153/61 (!) 158/61 (!) 150/52 (!) 152/56  Pulse: 65 63 64 71  Resp: 17 13 18 16   Temp:   98 F (36.7 C) 98.2 F (36.8 C)  TempSrc:   Oral Oral  SpO2:   100% 100%  Weight:   109.4 kg (241 lb 2.9 oz)   Height:       Respiratory: clear to auscultation bilaterally, no wheezing, no crackles.  Cardiovascular: Regular rate and rhythm, no murmurs / rubs / gallops. No LE edema. Abdomen: minimal tenderness LLQ, significantly improved. Bowel sounds positive.  Musculoskeletal: no clubbing / cyanosis. Neuro: non focal, strength equal     Data  Reviewed: I have personally reviewed following labs and imaging studies  CBC:  Recent Labs Lab 07/05/16 0215 07/06/16 0320 07/07/16 0240 07/08/16 0345 07/09/16 0730  WBC 6.2 5.3 4.9 6.0 5.6  HGB 11.1* 10.6* 10.4* 10.4* 10.3*  HCT 35.8* 32.6* 32.6* 32.1* 32.0*  MCV 92.0 90.3 88.8 88.4 89.6  PLT 265 274 248 234 884   Basic Metabolic Panel:  Recent Labs Lab 07/04/16 0414 07/05/16 0215 07/07/16 0240 07/08/16 0345 07/09/16 0730  NA 130* 133* 130* 133* 131*  K 4.1 3.9 3.6 3.8 3.6  CL 92* 93* 95* 95* 93*  CO2 28 24 23 26 25   GLUCOSE 65 57* 91 92 81  BUN 15 8 16  5* 12  CREATININE 7.40* 4.89* 9.08* 5.18* 7.25*  CALCIUM 7.8* 8.0* 7.6* 7.6* 7.5*  PHOS  --  4.6 5.5* 3.5 4.7*   GFR: Estimated Creatinine Clearance: 8.6 mL/min (by C-G formula based on SCr of 7.25 mg/dL (H)). Liver Function Tests:  Recent Labs Lab 07/05/16 0215 07/07/16 0240 07/08/16 0345 07/09/16 0730  ALBUMIN 3.0* 2.8* 2.8* 2.8*   No results for input(s): LIPASE, AMYLASE in the last 168 hours. No results for input(s): AMMONIA in the last 168 hours. Coagulation Profile:  Recent Labs Lab 07/05/16 0215 07/06/16 0320 07/07/16 0240 07/08/16 0345 07/09/16 0334  INR 2.71 4.15* 3.93 3.53 3.31   Cardiac Enzymes: No results for input(s): CKTOTAL, CKMB, CKMBINDEX, TROPONINI in the last 168 hours. BNP (last 3 results) No results for input(s): PROBNP in the last 8760 hours. HbA1C: No results for input(s): HGBA1C in the last 72 hours. CBG:  Recent Labs Lab 07/06/16 0730 07/06/16 1113  GLUCAP 96 115*   Lipid Profile: No results for input(s): CHOL, HDL, LDLCALC, TRIG, CHOLHDL, LDLDIRECT in the last 72 hours. Thyroid Function Tests: No results for input(s): TSH, T4TOTAL, FREET4, T3FREE, THYROIDAB in the last 72 hours. Anemia Panel: No results for input(s): VITAMINB12, FOLATE, FERRITIN, TIBC, IRON, RETICCTPCT in the last 72 hours. Urine analysis:    Component Value Date/Time   COLORURINE AMBER (A)  06/30/2016 0503   APPEARANCEUR HAZY (A) 06/30/2016 0503   LABSPEC 1.020 06/30/2016 0503   PHURINE 5.0 06/30/2016 0503   GLUCOSEU NEGATIVE 06/30/2016 0503   HGBUR NEGATIVE 06/30/2016 0503   BILIRUBINUR SMALL (A) 06/30/2016 0503   KETONESUR NEGATIVE 06/30/2016 0503   PROTEINUR 30 (A) 06/30/2016 0503   UROBILINOGEN 0.2 06/30/2012 2201   NITRITE NEGATIVE 06/30/2016 0503   LEUKOCYTESUR SMALL (A) 06/30/2016 0503   Sepsis Labs: Invalid input(s): PROCALCITONIN, LACTICIDVEN  No results found for this or any previous visit (from the past 240 hour(s)).  Radiology Studies: Ir Fluoro  Guide Cv Line Right  Result Date: 07/09/2016 CLINICAL DATA:  Renal failure, poor peripheral venous access, needs durable venous access for antibiotic regimen. EXAM: RIGHT IJ CENTRAL VENOUS CATHETER PLACEMENT UNDER ULTRASOUND AND FLUOROSCOPIC GUIDANCE FLUOROSCOPY TIME:  0.5 minute (15 uGym2 DAP) TECHNIQUE: The procedure, risks (including but not limited to bleeding, infection, organ damage ), benefits, and alternatives were explained to the patient. Questions regarding the procedure were encouraged and answered. The patient understands and consents to the procedure. Patency of the right IJ vein was confirmed with ultrasound with image documentation. An appropriate skin site was determined. Skin site was marked. Region was prepped using maximum barrier technique including cap and mask, sterile gown, sterile gloves, large sterile sheet, and Chlorhexidine as cutaneous antisepsis. The region was infiltrated locally with 1% lidocaine. Under real-time ultrasound guidance, the right IJ vein was accessed with a 21 gauge micropuncture needle; the needle tip within the vein was confirmed with ultrasound image documentation. The needle exchanged over a guidewire for peel-away sheath through which an 15cm 5 [place] PowerPICC dual lumen catheter was advanced. This was positioned with the tip at the cavoatrial junction. Spot chest radiograph  shows good positioning and no pneumothorax. Catheter was flushed and sutured externally with 0-Prolene sutures. Patient tolerated the procedure well. COMPLICATIONS: None immediate IMPRESSION: 1. Technically successful right IJ double lumen power injectable central venous catheter placement. Electronically Signed   By: Lucrezia Europe M.D.   On: 07/09/2016 14:52   Ir US Guide Vasc Access Right  Result Date: 07/09/2016 CLINICAL DATA:  Renal failure, poor peripheral venous access, needs durable venous access for antibiotic regimen. EXAM: RIGHT IJ CENTRAL VENOUS CATHETER PLACEMENT UNDER ULTRASOUND AND FLUOROSCOPIC GUIDANCE FLUOROSCOPY TIME:  0.5 minute (15 uGym2 DAP) TECHNIQUE: The procedure, risks (including but not limited to bleeding, infection, organ damage ), benefits, and alternatives were explained to the patient. Questions regarding the procedure were encouraged and answered. The patient understands and consents to the procedure. Patency of the right IJ vein was confirmed with ultrasound with image documentation. An appropriate skin site was determined. Skin site was marked. Region was prepped using maximum barrier technique including cap and mask, sterile gown, sterile gloves, large sterile sheet, and Chlorhexidine as cutaneous antisepsis. The region was infiltrated locally with 1% lidocaine. Under real-time ultrasound guidance, the right IJ vein was accessed with a 21 gauge micropuncture needle; the needle tip within the vein was confirmed with ultrasound image documentation. The needle exchanged over a guidewire for peel-away sheath through which an 15cm 5 [place] PowerPICC dual lumen catheter was advanced. This was positioned with the tip at the cavoatrial junction. Spot chest radiograph shows good positioning and no pneumothorax. Catheter was flushed and sutured externally with 0-Prolene sutures. Patient tolerated the procedure well. COMPLICATIONS: None immediate IMPRESSION: 1. Technically successful right  IJ double lumen power injectable central venous catheter placement. Electronically Signed   By: Lucrezia Europe M.D.   On: 07/09/2016 14:52    Scheduled Meds: . amiodarone  200 mg Oral Daily  . amoxicillin-clavulanate  1 tablet Oral Daily  . cinacalcet  30 mg Oral Q supper  . darbepoetin (ARANESP) injection - DIALYSIS  60 mcg Intravenous Q Wed-HD  . docusate sodium  200 mg Oral BID  . feeding supplement  1 Container Oral TID BM  . iopamidol      . lidocaine      . nystatin  5 mL Oral QID  . sodium chloride flush  3 mL Intravenous Q12H  . Warfarin -  Pharmacist Dosing Inpatient   Does not apply q1800   Continuous Infusions:   Hosie Poisson , MD,  [facility] Pager [phone]  If 7PM-7AM, please contact night-coverage [email or web address] Password [email or web address] 07/09/2016, 4:12 PM

## 2016-07-10 ENCOUNTER — Ambulatory Visit: Payer: Self-pay | Admitting: Surgery

## 2016-07-10 DIAGNOSIS — Z01810 Encounter for preprocedural cardiovascular examination: Secondary | ICD-10-CM

## 2016-07-10 LAB — CBC
HEMATOCRIT: 32.3 % — AB (ref 36.0–46.0)
Hemoglobin: 10.3 g/dL — ABNORMAL LOW (ref 12.0–15.0)
MCH: 28.5 pg (ref 26.0–34.0)
MCHC: 31.9 g/dL (ref 30.0–36.0)
MCV: 89.2 fL (ref 78.0–100.0)
PLATELETS: 217 10*3/uL (ref 150–400)
RBC: 3.62 MIL/uL — ABNORMAL LOW (ref 3.87–5.11)
RDW: 17 % — AB (ref 11.5–15.5)
WBC: 5.4 10*3/uL (ref 4.0–10.5)

## 2016-07-10 LAB — BASIC METABOLIC PANEL
Anion gap: 10 (ref 5–15)
BUN: 9 mg/dL (ref 6–20)
CALCIUM: 7.5 mg/dL — AB (ref 8.9–10.3)
CO2: 26 mmol/L (ref 22–32)
CREATININE: 5.14 mg/dL — AB (ref 0.44–1.00)
Chloride: 93 mmol/L — ABNORMAL LOW (ref 101–111)
GFR, EST AFRICAN AMERICAN: 9 mL/min — AB (ref >60)
GFR, EST NON AFRICAN AMERICAN: 8 mL/min — AB (ref >60)
GLUCOSE: 76 mg/dL (ref 65–99)
Potassium: 3.5 mmol/L (ref 3.5–5.1)
Sodium: 129 mmol/L — ABNORMAL LOW (ref 135–145)

## 2016-07-10 LAB — PROTIME-INR
INR: 2.59
Prothrombin Time: 28.2 seconds — ABNORMAL HIGH (ref 11.4–15.2)

## 2016-07-10 MED ORDER — CHLORHEXIDINE GLUCONATE CLOTH 2 % EX PADS
6.0000 | MEDICATED_PAD | Freq: Once | CUTANEOUS | Status: AC
  Administered 2016-07-10: 6 via TOPICAL

## 2016-07-10 MED ORDER — CEFAZOLIN SODIUM-DEXTROSE 2-4 GM/100ML-% IV SOLN
2.0000 g | INTRAVENOUS | Status: DC
  Filled 2016-07-10: qty 100

## 2016-07-10 MED ORDER — CHLORHEXIDINE GLUCONATE CLOTH 2 % EX PADS
6.0000 | MEDICATED_PAD | Freq: Once | CUTANEOUS | Status: AC
  Administered 2016-07-11: 6 via TOPICAL

## 2016-07-10 MED ORDER — CHLORHEXIDINE GLUCONATE CLOTH 2 % EX PADS: 6.0000 | MEDICATED_PAD | Freq: Once | CUTANEOUS | Status: DC

## 2016-07-10 MED ORDER — METRONIDAZOLE IN NACL 5-0.79 MG/ML-% IV SOLN
500.0000 mg | INTRAVENOUS | Status: DC
  Filled 2016-07-10: qty 100

## 2016-07-10 MED ORDER — PHYTONADIONE 5 MG PO TABS
5.0000 mg | ORAL_TABLET | Freq: Once | ORAL | Status: AC
  Administered 2016-07-10: 5 mg via ORAL
  Filled 2016-07-10: qty 1

## 2016-07-10 NOTE — Progress Notes (Signed)
Brief progress note:  Pt has agreed to surgery. Daughter was at bedside and all questions answered. Surgery will be tomorrow morning. Pt will be NPO at midnight. Will give pt PO vit K to reverse the effects of coumadin. Pt has not had coumadin for 6 days and her INR was 2.59 today. Pharmacy suggested we give 2.5-5mg  PO today and recheck INR first thing in the morning.   Jackson Latino, [facility] Pager [phone]

## 2016-07-10 NOTE — Progress Notes (Signed)
Assessment: 1 Acute diverticulitis/ microperforation- abd better 2 ESRD HD mwf, HD Fri 3 Volume: looks a bit dry, keep even on HD 4 HTN - BP work on vol 5 Atypical CP - trops neg, no further w/u per cards 6 Hx Afib - in NSR, since DCCV 6 yrs ago ([facility]), on amio/ coreg/ coumadin  Subjective: Interval History: Persistent LLQ discomfort and CT findings noted  Objective: Vital signs in last 24 hours: Temp:  [98 F (36.7 C)-98.2 F (36.8 C)] 98.1 F (36.7 C) (12/21 0500) Pulse Rate:  [64-71] 64 (12/21 0500) Resp:  [16] 16 (12/20 1244) BP: (136-154)/(45-56) 136/45 (12/21 0500) SpO2:  [91 %-100 %] 91 % (12/21 0500) Weight:  [109.5 kg (241 lb 4.8 oz)] 109.5 kg (241 lb 4.8 oz) (12/21 0500) Weight change:   Intake/Output from previous day: 12/20 0701 - 12/21 0700 In: 743 [P.O.:740; I.V.:3] Out: 1500  Intake/Output this shift: Total I/O In: 200 [P.O.:200] Out: -   General appearance: alert and cooperative Resp: clear to auscultation bilaterally Chest wall: no tenderness Cardio: regular rate and rhythm, S1, S2 normal, no murmur, click, rub or gallop GI: NE  Lab Results:  Recent Labs  07/09/16 0730 07/10/16 0730  WBC 5.6 5.4  HGB 10.3* 10.3*  HCT 32.0* 32.3*  PLT 242 217   BMET:  Recent Labs  07/09/16 0730 07/10/16 0730  NA 131* 129*  K 3.6 3.5  CL 93* 93*  CO2 25 26  GLUCOSE 81 76  BUN 12 9  CREATININE 7.25* 5.14*  CALCIUM 7.5* 7.5*   No results for input(s): PTH in the last 72 hours. Iron Studies: No results for input(s): IRON, TIBC, TRANSFERRIN, FERRITIN in the last 72 hours. Studies/Results: Ct Abdomen Pelvis W Contrast  Result Date: 07/09/2016 CLINICAL DATA:  Follow-up diverticulitis. Recurrent abdominal pain with advanced diet. History of end-stage renal disease on dialysis. EXAM: CT ABDOMEN AND PELVIS WITH CONTRAST TECHNIQUE: Multidetector CT imaging of the abdomen and pelvis was performed using the standard protocol following bolus  administration of intravenous contrast. CONTRAST:  100 cc ISOVUE-300 IOPAMIDOL (ISOVUE-300) INJECTION 61% COMPARISON:  CT abdomen and pelvis July 03, 2016. FINDINGS: LOWER CHEST: Lung bases are clear. The heart is mildly enlarged. No pericardial effusions. HEPATOBILIARY: Mild focal fatty infiltration about the falciform ligament, liver is otherwise unremarkable. Mildly distended gallbladder without pericholecystic inflammation or gallstones. PANCREAS: Normal. SPLEEN: Normal. ADRENALS/URINARY TRACT: Kidneys are orthotopic, demonstrating symmetric enhancement. Symmetrically atrophic kidneys. Multiple bilateral renal cysts measure up to 2.2 cm on the RIGHT. No nephrolithiasis, hydronephrosis or solid renal masses. The unopacified ureters are normal in course and caliber. Delayed imaging through the kidneys demonstrates symmetric prompt contrast excretion within the proximal urinary collecting system. Urinary bladder is partially distended and unremarkable. Normal adrenal glands. STOMACH/BOWEL: Approximate 12 cm segment of sigmoid acute diverticulitis, with mesenteric mural thickening. Pericolonic fat stranding. Contained micro perforation anti mesenteric soft tissues. No drainable fluid collection. No intraperitoneal free air. No bowel obstruction, contrast to the level of the rectum. Small and large bowel air-fluid levels. The stomach, small bowel are normal in course and caliber without inflammatory changes. VASCULAR/LYMPHATIC: Aortoiliac vessels are normal in course and caliber, moderate atherosclerosis. No lymphadenopathy by CT size criteria. REPRODUCTIVE: LEFT adnexae is mildly enlarged with heterogeneous enhancement. OTHER: No intraperitoneal free fluid or free air. MUSCULOSKELETAL: Nonacute. Moderate sacroiliac osteoarthrosis. Broad dextroscoliosis. Status post L4-5 PLIF, non incorporated interbody disc material with trace lucency about the LEFT L5 pedicle screw. IMPRESSION: Severe sigmoid diverticulitis  with similar contained  microperforation, no drainable fluid collection. Considering eccentric nature of the mural thickening, recommend follow-up endoscopy after resolution of acute symptoms to exclude mass. Presumed reactive changes of the adjacent LEFT adnexum.  Mild ileus. L4-5 PLIF with mild lucency about the L5 pedicle screw and, non incorporated interbody fusion material concerning for hardware failure. Electronically Signed   By: Elon Alas M.D.   On: 07/09/2016 20:44   Ir Fluoro Guide Cv Line Right  Result Date: 07/09/2016 CLINICAL DATA:  Renal failure, poor peripheral venous access, needs durable venous access for antibiotic regimen. EXAM: RIGHT IJ CENTRAL VENOUS CATHETER PLACEMENT UNDER ULTRASOUND AND FLUOROSCOPIC GUIDANCE FLUOROSCOPY TIME:  0.5 minute (15 uGym2 DAP) TECHNIQUE: The procedure, risks (including but not limited to bleeding, infection, organ damage ), benefits, and alternatives were explained to the patient. Questions regarding the procedure were encouraged and answered. The patient understands and consents to the procedure. Patency of the right IJ vein was confirmed with ultrasound with image documentation. An appropriate skin site was determined. Skin site was marked. Region was prepped using maximum barrier technique including cap and mask, sterile gown, sterile gloves, large sterile sheet, and Chlorhexidine as cutaneous antisepsis. The region was infiltrated locally with 1% lidocaine. Under real-time ultrasound guidance, the right IJ vein was accessed with a 21 gauge micropuncture needle; the needle tip within the vein was confirmed with ultrasound image documentation. The needle exchanged over a guidewire for peel-away sheath through which an 15cm 5 [place] PowerPICC dual lumen catheter was advanced. This was positioned with the tip at the cavoatrial junction. Spot chest radiograph shows good positioning and no pneumothorax. Catheter was flushed and sutured externally with  0-Prolene sutures. Patient tolerated the procedure well. COMPLICATIONS: None immediate IMPRESSION: 1. Technically successful right IJ double lumen power injectable central venous catheter placement. Electronically Signed   By: Lucrezia Europe M.D.   On: 07/09/2016 14:52   Ir US Guide Vasc Access Right  Result Date: 07/09/2016 CLINICAL DATA:  Renal failure, poor peripheral venous access, needs durable venous access for antibiotic regimen. EXAM: RIGHT IJ CENTRAL VENOUS CATHETER PLACEMENT UNDER ULTRASOUND AND FLUOROSCOPIC GUIDANCE FLUOROSCOPY TIME:  0.5 minute (15 uGym2 DAP) TECHNIQUE: The procedure, risks (including but not limited to bleeding, infection, organ damage ), benefits, and alternatives were explained to the patient. Questions regarding the procedure were encouraged and answered. The patient understands and consents to the procedure. Patency of the right IJ vein was confirmed with ultrasound with image documentation. An appropriate skin site was determined. Skin site was marked. Region was prepped using maximum barrier technique including cap and mask, sterile gown, sterile gloves, large sterile sheet, and Chlorhexidine as cutaneous antisepsis. The region was infiltrated locally with 1% lidocaine. Under real-time ultrasound guidance, the right IJ vein was accessed with a 21 gauge micropuncture needle; the needle tip within the vein was confirmed with ultrasound image documentation. The needle exchanged over a guidewire for peel-away sheath through which an 15cm 5 [place] PowerPICC dual lumen catheter was advanced. This was positioned with the tip at the cavoatrial junction. Spot chest radiograph shows good positioning and no pneumothorax. Catheter was flushed and sutured externally with 0-Prolene sutures. Patient tolerated the procedure well. COMPLICATIONS: None immediate IMPRESSION: 1. Technically successful right IJ double lumen power injectable central venous catheter placement. Electronically Signed    By: Lucrezia Europe M.D.   On: 07/09/2016 14:52    Scheduled: . amiodarone  200 mg Oral Daily  . amoxicillin-clavulanate  1 tablet Oral Daily  . cinacalcet  30  mg Oral Q supper  . darbepoetin (ARANESP) injection - DIALYSIS  60 mcg Intravenous Q Wed-HD  . docusate sodium  200 mg Oral BID  . feeding supplement  1 Container Oral TID BM  . nystatin  5 mL Oral QID  . sodium chloride flush  3 mL Intravenous Q12H    LOS: 14 days   Alinah Sheard C 07/10/2016,12:43 PM

## 2016-07-10 NOTE — Progress Notes (Signed)
[facility] Progress Note     Subjective: Similar account of pain with bowel movements requiring pain medication. Remains afebrile, no labs yet this Am. Repeat CT without improvement. She denies fever, chills, nausea or vomiting but PO intake has been limited.   Objective: Vital signs in last 24 hours: Temp:  [98 F (36.7 C)-98.2 F (36.8 C)] 98.1 F (36.7 C) (12/21 0500) Pulse Rate:  [57-71] 64 (12/21 0500) Resp:  [13-19] 16 (12/20 1244) BP: (136-174)/(45-84) 136/45 (12/21 0500) SpO2:  [91 %-100 %] 91 % (12/21 0500) Weight:  [109.4 kg (241 lb 2.9 oz)-109.5 kg (241 lb 4.8 oz)] 109.5 kg (241 lb 4.8 oz) (12/21 0500) Last BM Date: 07/09/16  Intake/Output from previous day: 12/20 0701 - 12/21 0700 In: 743 [P.O.:740; I.V.:3] Out: 1500  Intake/Output this shift: No intake/output data recorded.  PE: General: pleasant, WD/WN femalewho is laying in bed in NAD, cooperative  Heart: regular, rate, and rhythm. Mild systolic murmur appreciated, normal S1 and S2,  Lungs: rate normal, effort nonlabored Abd: soft, ND, +BS, mild TTP to LLQ  Skin: warm and dry with no rashes noted  Lab Results:   Recent Labs  07/08/16 0345 07/09/16 0730  WBC 6.0 5.6  HGB 10.4* 10.3*  HCT 32.1* 32.0*  PLT 234 242   BMET  Recent Labs  07/08/16 0345 07/09/16 0730  NA 133* 131*  K 3.8 3.6  CL 95* 93*  CO2 26 25  GLUCOSE 92 81  BUN 5* 12  CREATININE 5.18* 7.25*  CALCIUM 7.6* 7.5*   PT/INR  Recent Labs  07/08/16 0345 07/09/16 0334  LABPROT 36.2* 34.4*  INR 3.53 3.31   CMP     Component Value Date/Time   NA 131 (L) 07/09/2016 0730   K 3.6 07/09/2016 0730   CL 93 (L) 07/09/2016 0730   CO2 25 07/09/2016 0730   GLUCOSE 81 07/09/2016 0730   BUN 12 07/09/2016 0730   CREATININE 7.25 (H) 07/09/2016 0730   CALCIUM 7.5 (L) 07/09/2016 0730   PROT 7.0 06/26/2016 0321   ALBUMIN 2.8 (L) 07/09/2016 0730   AST 18 06/26/2016 0321   ALT 12 (L) 06/26/2016 0321   ALKPHOS 105  06/26/2016 0321   BILITOT 0.8 06/26/2016 0321   GFRNONAA 5 (L) 07/09/2016 0730   GFRAA 6 (L) 07/09/2016 0730   Lipase  No results found for: LIPASE     Studies/Results: Ct Abdomen Pelvis W Contrast  Result Date: 07/09/2016 CLINICAL DATA:  Follow-up diverticulitis. Recurrent abdominal pain with advanced diet. History of end-stage renal disease on dialysis. EXAM: CT ABDOMEN AND PELVIS WITH CONTRAST TECHNIQUE: Multidetector CT imaging of the abdomen and pelvis was performed using the standard protocol following bolus administration of intravenous contrast. CONTRAST:  100 cc ISOVUE-300 IOPAMIDOL (ISOVUE-300) INJECTION 61% COMPARISON:  CT abdomen and pelvis July 03, 2016. FINDINGS: LOWER CHEST: Lung bases are clear. The heart is mildly enlarged. No pericardial effusions. HEPATOBILIARY: Mild focal fatty infiltration about the falciform ligament, liver is otherwise unremarkable. Mildly distended gallbladder without pericholecystic inflammation or gallstones. PANCREAS: Normal. SPLEEN: Normal. ADRENALS/URINARY TRACT: Kidneys are orthotopic, demonstrating symmetric enhancement. Symmetrically atrophic kidneys. Multiple bilateral renal cysts measure up to 2.2 cm on the RIGHT. No nephrolithiasis, hydronephrosis or solid renal masses. The unopacified ureters are normal in course and caliber. Delayed imaging through the kidneys demonstrates symmetric prompt contrast excretion within the proximal urinary collecting system. Urinary bladder is partially distended and unremarkable. Normal adrenal glands. STOMACH/BOWEL: Approximate 12 cm segment of sigmoid acute diverticulitis,  with mesenteric mural thickening. Pericolonic fat stranding. Contained micro perforation anti mesenteric soft tissues. No drainable fluid collection. No intraperitoneal free air. No bowel obstruction, contrast to the level of the rectum. Small and large bowel air-fluid levels. The stomach, small bowel are normal in course and caliber without  inflammatory changes. VASCULAR/LYMPHATIC: Aortoiliac vessels are normal in course and caliber, moderate atherosclerosis. No lymphadenopathy by CT size criteria. REPRODUCTIVE: LEFT adnexae is mildly enlarged with heterogeneous enhancement. OTHER: No intraperitoneal free fluid or free air. MUSCULOSKELETAL: Nonacute. Moderate sacroiliac osteoarthrosis. Broad dextroscoliosis. Status post L4-5 PLIF, non incorporated interbody disc material with trace lucency about the LEFT L5 pedicle screw. IMPRESSION: Severe sigmoid diverticulitis with similar contained microperforation, no drainable fluid collection. Considering eccentric nature of the mural thickening, recommend follow-up endoscopy after resolution of acute symptoms to exclude mass. Presumed reactive changes of the adjacent LEFT adnexum.  Mild ileus. L4-5 PLIF with mild lucency about the L5 pedicle screw and, non incorporated interbody fusion material concerning for hardware failure. Electronically Signed   By: Elon Alas M.D.   On: 07/09/2016 20:44   Ir Fluoro Guide Cv Line Right  Result Date: 07/09/2016 CLINICAL DATA:  Renal failure, poor peripheral venous access, needs durable venous access for antibiotic regimen. EXAM: RIGHT IJ CENTRAL VENOUS CATHETER PLACEMENT UNDER ULTRASOUND AND FLUOROSCOPIC GUIDANCE FLUOROSCOPY TIME:  0.5 minute (15 uGym2 DAP) TECHNIQUE: The procedure, risks (including but not limited to bleeding, infection, organ damage ), benefits, and alternatives were explained to the patient. Questions regarding the procedure were encouraged and answered. The patient understands and consents to the procedure. Patency of the right IJ vein was confirmed with ultrasound with image documentation. An appropriate skin site was determined. Skin site was marked. Region was prepped using maximum barrier technique including cap and mask, sterile gown, sterile gloves, large sterile sheet, and Chlorhexidine as cutaneous antisepsis. The region was  infiltrated locally with 1% lidocaine. Under real-time ultrasound guidance, the right IJ vein was accessed with a 21 gauge micropuncture needle; the needle tip within the vein was confirmed with ultrasound image documentation. The needle exchanged over a guidewire for peel-away sheath through which an 15cm 5 [place] PowerPICC dual lumen catheter was advanced. This was positioned with the tip at the cavoatrial junction. Spot chest radiograph shows good positioning and no pneumothorax. Catheter was flushed and sutured externally with 0-Prolene sutures. Patient tolerated the procedure well. COMPLICATIONS: None immediate IMPRESSION: 1. Technically successful right IJ double lumen power injectable central venous catheter placement. Electronically Signed   By: Lucrezia Europe M.D.   On: 07/09/2016 14:52   Ir US Guide Vasc Access Right  Result Date: 07/09/2016 CLINICAL DATA:  Renal failure, poor peripheral venous access, needs durable venous access for antibiotic regimen. EXAM: RIGHT IJ CENTRAL VENOUS CATHETER PLACEMENT UNDER ULTRASOUND AND FLUOROSCOPIC GUIDANCE FLUOROSCOPY TIME:  0.5 minute (15 uGym2 DAP) TECHNIQUE: The procedure, risks (including but not limited to bleeding, infection, organ damage ), benefits, and alternatives were explained to the patient. Questions regarding the procedure were encouraged and answered. The patient understands and consents to the procedure. Patency of the right IJ vein was confirmed with ultrasound with image documentation. An appropriate skin site was determined. Skin site was marked. Region was prepped using maximum barrier technique including cap and mask, sterile gown, sterile gloves, large sterile sheet, and Chlorhexidine as cutaneous antisepsis. The region was infiltrated locally with 1% lidocaine. Under real-time ultrasound guidance, the right IJ vein was accessed with a 21 gauge micropuncture needle; the needle tip within  the vein was confirmed with ultrasound image  documentation. The needle exchanged over a guidewire for peel-away sheath through which an 15cm 5 [place] PowerPICC dual lumen catheter was advanced. This was positioned with the tip at the cavoatrial junction. Spot chest radiograph shows good positioning and no pneumothorax. Catheter was flushed and sutured externally with 0-Prolene sutures. Patient tolerated the procedure well. COMPLICATIONS: None immediate IMPRESSION: 1. Technically successful right IJ double lumen power injectable central venous catheter placement. Electronically Signed   By: Lucrezia Europe M.D.   On: 07/09/2016 14:52    Anti-infectives: Anti-infectives    Start     Dose/Rate Route Frequency Ordered Stop   07/09/16 2200  piperacillin-tazobactam (ZOSYN) IVPB 3.375 g  Status:  Discontinued     3.375 g 12.5 mL/hr over 240 Minutes Intravenous Every 12 hours 07/08/16 1704 07/09/16 1242   07/09/16 2000  amoxicillin-clavulanate (AUGMENTIN) 500-125 MG per tablet 500 mg     1 tablet Oral Daily 07/09/16 1311     07/09/16 1245  amoxicillin-clavulanate (AUGMENTIN) 875-125 MG per tablet 1 tablet  Status:  Discontinued     1 tablet Oral Every 12 hours 07/09/16 1242 07/09/16 1302   07/08/16 2200  amoxicillin-clavulanate (AUGMENTIN) 875-125 MG per tablet 1 tablet  Status:  Discontinued     1 tablet Oral Every 12 hours 07/08/16 1650 07/08/16 1704   07/08/16 2200  amoxicillin-clavulanate (AUGMENTIN) 875-125 MG per tablet 1 tablet     1 tablet Oral  Once 07/08/16 1704 07/08/16 2134   07/03/16 1015  piperacillin-tazobactam (ZOSYN) IVPB 3.375 g  Status:  Discontinued     3.375 g 12.5 mL/hr over 240 Minutes Intravenous Every 12 hours 07/03/16 1001 07/08/16 1704   07/01/16 2200  ciprofloxacin (CIPRO) tablet 500 mg  Status:  Discontinued     500 mg Oral Daily at bedtime 07/01/16 1400 07/03/16 0922   06/26/16 2200  ciprofloxacin (CIPRO) IVPB 400 mg  Status:  Discontinued     400 mg 200 mL/hr over 60 Minutes Intravenous Every 24 hours 06/26/16 0035  07/01/16 2111   06/25/16 2200  ciprofloxacin (CIPRO) tablet 500 mg  Status:  Discontinued     500 mg Oral Every 24 hours 06/25/16 2146 06/26/16 0030   06/25/16 2200  metroNIDAZOLE (FLAGYL) tablet 500 mg     500 mg Oral Every 8 hours 06/25/16 2146 07/02/16 2159       Assessment/Plan  Chest pain-resolved, pt seen by cardiology,no further ischemia testing indicated at this time. F/U with cards as outpatient. History of atrial fibrillation- on warfarin, amiodarone and Coreg.  Hypertension-on losartan, furosemide. ESRD on hemodialysis- MWF HD, nephrology following  FEN: clears VTE: SCD's and warfarin ID: Zosyn 12/14>>12/20, augmentin 12/20->  A/P:  Diverticulitis. Labs and vitals normal. Still having some pain. Continue abx. She is clinically stable but pain is non-resolving and CT is without improvement. I think she will need sigmoid colectomy before she can leave the hospital. I spoke with her this morning about surgery, possibly with a temporary ostomy. Will have WOCN mark her today. Her last INR was >3. Once she has had a chance to speak with her daughter we'll plan her surgery date. Will need to be transitioned off coumadin to heparin drip and cards clearance in anticipation of surgery.      LOS: 14 days    Clovis Riley , [facility] 07/10/2016, 7:25 AM

## 2016-07-10 NOTE — Consult Note (Signed)
Cardiology Consult    [name] MRN: [id number], DOB/AGE: 72-Jun-1947   Admit date: 06/25/2016 Date of Consult: 07/10/2016  Primary Physician: [facility] Primary Cardiologist: Dr. Quentin Cornwall [phone]) Requesting Provider: Dr. Karleen Hampshire Reason for Consultation: Preop  Patient Profile    70 yo female with PMH of HTN, AF, and ESRD on HD who presented on 06/25/16 with abd pain and chest pain. Found to have diverticulitis with microperforation, not responding well to antibiotic treatment with plans for possible sigmoid colectomy and temporary ostomy.   Past Medical History   Past Medical History:  Diagnosis Date  . Acid reflux   . Atrial fibrillation ([place])   . Hypertension   . Renal disorder   . Sleep apnea     Past Surgical History:  Procedure Laterality Date  . IR GENERIC HISTORICAL  07/09/2016   IR US GUIDE VASC ACCESS RIGHT 07/09/2016 Arne Cleveland, MD [facility]  . IR GENERIC HISTORICAL  07/09/2016   IR FLUORO GUIDE CV LINE RIGHT 07/09/2016 Arne Cleveland, MD [facility]  . KNEE ARTHROPLASTY    . TUBAL LIGATION       Allergies  Allergies  Allergen Reactions  . Tape Other (See Comments)    PLASTIC TAPE TEARS OFF THE SKIN AND BRUISES IT TERRIBLY!!  . Latex Rash    History of Present Illness    Jane [name] is a 70 yo female with PMH of PAF (on coumadin), HTN, GERD, chronic back pain, ESRD on HD. Reports she moved here about a year ago from [place], and established care with Dr. Quentin Cornwall with [facility] as her cardiologist. States she did undergo cardiac testing while in [place] prior to having her back surgery, but this was many years ago. Is on HD MWF, reports she has been tolerating this well.   Reports being in her usual state of health up until a couple of weeks ago. She was at dialysis and developed upper/epigastric pain and pain under her right breast while receiving dialysis. She was brought to the ED for further evaluation. In the ED her CT abd/pelvis  showed complex structure in the area of the colon that was interpreted as a contained perforation. She was started on IV antibiotics with little improvement. Cardiology was consulted on admission and felt that her chest pain was likely from a GI source. Recommended to continue her on her home medication regimen.   Surgery was consulted and has been following this admission. She has not responded well to IV antibiotics and is now planned for sigmoid colectomy and temporary ostomy. Cardiology has been called for preop clearance. She reports generally being fairly active at home and cares for herself. Does not normally have any anginal symptoms or dyspnea. Denies any known CAD or other cardiac intervention. No further reports of chest pain this admission.   Inpatient Medications    . amiodarone  200 mg Oral Daily  . amoxicillin-clavulanate  1 tablet Oral Daily  . cinacalcet  30 mg Oral Q supper  . darbepoetin (ARANESP) injection - DIALYSIS  60 mcg Intravenous Q Wed-HD  . docusate sodium  200 mg Oral BID  . feeding supplement  1 Container Oral TID BM  . nystatin  5 mL Oral QID  . sodium chloride flush  3 mL Intravenous Q12H    Family History    Family History  Problem Relation Age of Onset  . Heart failure Mother   . Stroke Mother   . Other Father  Social History    Social History   Social History  . Marital status: Single    Spouse name: N/A  . Number of children: N/A  . Years of education: N/A   Occupational History  . Not on file.   Social History Main Topics  . Smoking status: Never Smoker  . Smokeless tobacco: Never Used  . Alcohol use No  . Drug use: No  . Sexual activity: No   Other Topics Concern  . Not on file   Social History Narrative  . No narrative on file     Review of Systems    General:  No chills, fever, night sweats or weight changes.  Cardiovascular: See HPI Dermatological: No rash, lesions/masses Respiratory: No cough, dyspnea Urologic: No  hematuria, dysuria Abdominal:   No nausea, vomiting, diarrhea, bright red blood per rectum, melena, or hematemesis, ++ abd pain Neurologic:  No visual changes, wkns, changes in mental status. All other systems reviewed and are otherwise negative except as noted above.  Physical Exam    Blood pressure (!) 136/45, pulse 64, temperature 98.1 F (36.7 C), temperature source Oral, resp. rate 16, height 5\' 5"  (1.651 m), weight 241 lb 4.8 oz (109.5 kg), SpO2 91 %.  General: Pleasant obese older AAF, NAD Psych: Normal affect. Neuro: Alert and oriented X 3. Moves all extremities spontaneously. HEENT: Normal  Neck: Supple without bruits or JVD. Lungs:  Resp regular and unlabored, CTA. Heart: RRR no s3, s4, or murmurs. Abdomen: Soft, non-tender, non-distended, BS + x 4.  Extremities: No clubbing, cyanosis or edema. DP/PT/Radials 2+ and equal bilaterally.  Labs    Troponin (Point of Care Test) No results for input(s): TROPIPOC in the last 72 hours. No results for input(s): CKTOTAL, CKMB, TROPONINI in the last 72 hours. Lab Results  Component Value Date   WBC 5.4 07/10/2016   HGB 10.3 (L) 07/10/2016   HCT 32.3 (L) 07/10/2016   MCV 89.2 07/10/2016   PLT 217 07/10/2016    Recent Labs Lab 07/10/16 0730  NA 129*  K 3.5  CL 93*  CO2 26  BUN 9  CREATININE 5.14*  CALCIUM 7.5*  GLUCOSE 76   Lab Results  Component Value Date   CHOL 130 06/27/2016   HDL 49 06/27/2016   LDLCALC 73 06/27/2016   TRIG 42 06/27/2016   No results found for: [facility]   Radiology Studies    Dg Chest 2 View  Result Date: 06/25/2016 CLINICAL DATA:  Right-sided chest pain and shortness of breath, initial encounter EXAM: CHEST  2 VIEW COMPARISON:  None. FINDINGS: Cardiac shadow is at the upper limits of normal in size. Aortic calcifications are noted. Long segment stenting in the region of the right shoulder is noted. The lungs are well aerated bilaterally. No focal infiltrate or sizable effusion is seen. No bony  abnormality is noted. IMPRESSION: No acute abnormality seen. Electronically Signed   By: Inez Catalina M.D.   On: 06/25/2016 13:21   Ct Abdomen Pelvis W Contrast  Result Date: 07/09/2016 CLINICAL DATA:  Follow-up diverticulitis. Recurrent abdominal pain with advanced diet. History of end-stage renal disease on dialysis. EXAM: CT ABDOMEN AND PELVIS WITH CONTRAST TECHNIQUE: Multidetector CT imaging of the abdomen and pelvis was performed using the standard protocol following bolus administration of intravenous contrast. CONTRAST:  100 cc ISOVUE-300 IOPAMIDOL (ISOVUE-300) INJECTION 61% COMPARISON:  CT abdomen and pelvis July 03, 2016. FINDINGS: LOWER CHEST: Lung bases are clear. The heart is mildly enlarged. No pericardial effusions. HEPATOBILIARY:  Mild focal fatty infiltration about the falciform ligament, liver is otherwise unremarkable. Mildly distended gallbladder without pericholecystic inflammation or gallstones. PANCREAS: Normal. SPLEEN: Normal. ADRENALS/URINARY TRACT: Kidneys are orthotopic, demonstrating symmetric enhancement. Symmetrically atrophic kidneys. Multiple bilateral renal cysts measure up to 2.2 cm on the RIGHT. No nephrolithiasis, hydronephrosis or solid renal masses. The unopacified ureters are normal in course and caliber. Delayed imaging through the kidneys demonstrates symmetric prompt contrast excretion within the proximal urinary collecting system. Urinary bladder is partially distended and unremarkable. Normal adrenal glands. STOMACH/BOWEL: Approximate 12 cm segment of sigmoid acute diverticulitis, with mesenteric mural thickening. Pericolonic fat stranding. Contained micro perforation anti mesenteric soft tissues. No drainable fluid collection. No intraperitoneal free air. No bowel obstruction, contrast to the level of the rectum. Small and large bowel air-fluid levels. The stomach, small bowel are normal in course and caliber without inflammatory changes. VASCULAR/LYMPHATIC:  Aortoiliac vessels are normal in course and caliber, moderate atherosclerosis. No lymphadenopathy by CT size criteria. REPRODUCTIVE: LEFT adnexae is mildly enlarged with heterogeneous enhancement. OTHER: No intraperitoneal free fluid or free air. MUSCULOSKELETAL: Nonacute. Moderate sacroiliac osteoarthrosis. Broad dextroscoliosis. Status post L4-5 PLIF, non incorporated interbody disc material with trace lucency about the LEFT L5 pedicle screw. IMPRESSION: Severe sigmoid diverticulitis with similar contained microperforation, no drainable fluid collection. Considering eccentric nature of the mural thickening, recommend follow-up endoscopy after resolution of acute symptoms to exclude mass. Presumed reactive changes of the adjacent LEFT adnexum.  Mild ileus. L4-5 PLIF with mild lucency about the L5 pedicle screw and, non incorporated interbody fusion material concerning for hardware failure. Electronically Signed   By: Elon Alas M.D.   On: 07/09/2016 20:44   Ct Abdomen Pelvis W Contrast  Result Date: 07/03/2016 CLINICAL DATA:  Left lower quadrant abdominal pain for several months. EXAM: CT ABDOMEN AND PELVIS WITH CONTRAST TECHNIQUE: Multidetector CT imaging of the abdomen and pelvis was performed using the standard protocol following bolus administration of intravenous contrast. CONTRAST:  167mL ISOVUE-300 IOPAMIDOL (ISOVUE-300) INJECTION 61% COMPARISON:  CT scan of June 25, 2016. FINDINGS: Lower chest: No acute abnormality. Hepatobiliary: No focal liver abnormality is seen. No gallstones, gallbladder wall thickening, or biliary dilatation. Pancreas: Unremarkable. No pancreatic ductal dilatation or surrounding inflammatory changes. Spleen: Normal in size without focal abnormality. Adrenals/Urinary Tract: Adrenal glands appear normal. Bilateral renal atrophy is noted with stable bilateral renal cysts. No hydronephrosis or renal obstruction is noted. Urinary bladder is decompressed. Stomach/Bowel: The  appendix appears normal. There is no evidence of bowel obstruction. Sigmoid diverticulosis is noted with wall thickening and surrounding inflammation suggesting sigmoid diverticulitis. There does appear to an increased amount of gas in the peritoneal fat adjacent to sigmoid colon concerning for micro perforation related to inflammation. Vascular/Lymphatic: Aortic atherosclerosis. No enlarged abdominal or pelvic lymph nodes. Reproductive: Uterus and bilateral adnexa are unremarkable. Other: No abdominal wall hernia or abnormality. No abdominopelvic ascites. Musculoskeletal: Status post surgical posterior fusion of L4-5 with bilateral intrapedicular screw placement. Multilevel degenerative disc disease is noted in the lower lumbar spine. IMPRESSION: Findings consistent with acute sigmoid diverticulitis, with increased amount of gas seen in adjacent peritoneal fat compared to prior exam, consistent with micro perforation and possible developing abscess. Aortic atherosclerosis. Electronically Signed   By: Marijo Conception, M.D.   On: 07/03/2016 08:51   Ct Abdomen Pelvis W Contrast  Addendum Date: 06/25/2016   ADDENDUM REPORT: 06/25/2016 20:49 ADDENDUM: Upon further review, there are a few small locules of gas positioned just adjacent to the sigmoid colon, which likely  reflect a small micro perforation. This is best appreciated on coronal sequence (series 5, image 58). This is closely approximated to the adjacent uterus and left ovary. Additionally, possible small diverticular abscess present adjacent to the sigmoid measures approximately 2.5 x 1.0 cm (series 2, image 56). Given the relative lack of extensive inflammatory changes, these findings are suspected to have been ongoing/smoldering in nature, which could correlate with patient's waxing and waning symptoms. No definite mass lesion identified. Critical Value/emergent results were called by telephone at the time of interpretation on 06/25/2016 at 8:44 pm to Dr.  Norina Buzzard , who verbally acknowledged these results. Electronically Signed   By: Jeannine Boga M.D.   On: 06/25/2016 20:49   Result Date: 06/25/2016 CLINICAL DATA:  Initial evaluation for acute left flank pain. EXAM: CT ABDOMEN AND PELVIS WITH CONTRAST TECHNIQUE: Multidetector CT imaging of the abdomen and pelvis was performed using the standard protocol following bolus administration of intravenous contrast. CONTRAST:  135mL ISOVUE-300 IOPAMIDOL (ISOVUE-300) INJECTION 61% COMPARISON:  None available. FINDINGS: Lower chest: Visualized lung bases are clear. Mild cardiomegaly partially visualized. Hepatobiliary: Liver demonstrates a normal contrast enhanced appearance. Gallbladder normal. No biliary dilatation. Pancreas: Pancreas within normal limits. Spleen: Spleen within normal limits. Adrenals/Urinary Tract: Adrenal glands are normal. Kidneys are somewhat small and atrophic bilaterally, compatible with history of end-stage renal disease. Innumerable cystic lesions present. The larger of these lesions measure water density common ir most compatible with cysts. The smaller lesions are too small the characterize. No hydroureter. Bladder largely decompressed. Mild circumferential bladder wall thickening most likely related incomplete distension. Stomach/Bowel: Stomach within normal limits. No evidence for bowel obstruction. Appendix normal. Sigmoid diverticulosis without imaging findings to suggest acute diverticulitis. No acute inflammatory changes identified about the bowels. Vascular/Lymphatic: Moderate aorto bi-iliac atherosclerotic disease. No aneurysm. Shotty subcentimeter retroperitoneal lymph nodes noted. No pathologically enlarged intra-abdominal or pelvic lymph nodes identified. Reproductive: Uterus within normal limits. Ovaries grossly unremarkable. Other: No free intraperitoneal air. No free fluid. Small fat containing paraumbilical hernia noted. Musculoskeletal: Postsurgical changes noted  within the lumbar spine. No acute osseous abnormality. No worrisome lytic or blastic osseous lesions. IMPRESSION: 1. No CT evidence for acute intra-abdominal pelvic process. 2. Sigmoid diverticulosis without evidence for acute diverticulitis. 3. Small and atrophic kidneys bilaterally with multiple cysts present, compatible with history of end-stage renal disease. Electronically Signed: By: Jeannine Boga M.D. On: 06/25/2016 17:38   Ir Fluoro Guide Cv Line Right  Result Date: 07/09/2016 CLINICAL DATA:  Renal failure, poor peripheral venous access, needs durable venous access for antibiotic regimen. EXAM: RIGHT IJ CENTRAL VENOUS CATHETER PLACEMENT UNDER ULTRASOUND AND FLUOROSCOPIC GUIDANCE FLUOROSCOPY TIME:  0.5 minute (15 uGym2 DAP) TECHNIQUE: The procedure, risks (including but not limited to bleeding, infection, organ damage ), benefits, and alternatives were explained to the patient. Questions regarding the procedure were encouraged and answered. The patient understands and consents to the procedure. Patency of the right IJ vein was confirmed with ultrasound with image documentation. An appropriate skin site was determined. Skin site was marked. Region was prepped using maximum barrier technique including cap and mask, sterile gown, sterile gloves, large sterile sheet, and Chlorhexidine as cutaneous antisepsis. The region was infiltrated locally with 1% lidocaine. Under real-time ultrasound guidance, the right IJ vein was accessed with a 21 gauge micropuncture needle; the needle tip within the vein was confirmed with ultrasound image documentation. The needle exchanged over a guidewire for peel-away sheath through which an 15cm 5 [place] PowerPICC dual lumen catheter was advanced.  This was positioned with the tip at the cavoatrial junction. Spot chest radiograph shows good positioning and no pneumothorax. Catheter was flushed and sutured externally with 0-Prolene sutures. Patient tolerated the procedure  well. COMPLICATIONS: None immediate IMPRESSION: 1. Technically successful right IJ double lumen power injectable central venous catheter placement. Electronically Signed   By: Lucrezia Europe M.D.   On: 07/09/2016 14:52   Ir US Guide Vasc Access Right  Result Date: 07/09/2016 CLINICAL DATA:  Renal failure, poor peripheral venous access, needs durable venous access for antibiotic regimen. EXAM: RIGHT IJ CENTRAL VENOUS CATHETER PLACEMENT UNDER ULTRASOUND AND FLUOROSCOPIC GUIDANCE FLUOROSCOPY TIME:  0.5 minute (15 uGym2 DAP) TECHNIQUE: The procedure, risks (including but not limited to bleeding, infection, organ damage ), benefits, and alternatives were explained to the patient. Questions regarding the procedure were encouraged and answered. The patient understands and consents to the procedure. Patency of the right IJ vein was confirmed with ultrasound with image documentation. An appropriate skin site was determined. Skin site was marked. Region was prepped using maximum barrier technique including cap and mask, sterile gown, sterile gloves, large sterile sheet, and Chlorhexidine as cutaneous antisepsis. The region was infiltrated locally with 1% lidocaine. Under real-time ultrasound guidance, the right IJ vein was accessed with a 21 gauge micropuncture needle; the needle tip within the vein was confirmed with ultrasound image documentation. The needle exchanged over a guidewire for peel-away sheath through which an 15cm 5 [place] PowerPICC dual lumen catheter was advanced. This was positioned with the tip at the cavoatrial junction. Spot chest radiograph shows good positioning and no pneumothorax. Catheter was flushed and sutured externally with 0-Prolene sutures. Patient tolerated the procedure well. COMPLICATIONS: None immediate IMPRESSION: 1. Technically successful right IJ double lumen power injectable central venous catheter placement. Electronically Signed   By: Lucrezia Europe M.D.   On: 07/09/2016 14:52    ECG  & Cardiac Imaging    EKG: SR  Echo: None  Assessment & Plan    70 yo female with PMH of HTN, AF, and ESRD on HD who presented on 06/25/16 with abd pain and chest pain. Found to have diverticulitis with microperforation, not responding well to antibiotic treatment with plans for possible sigmoid colectomy and temporary ostomy.  Preop: Denies any anginal symptoms prior to admission, or dyspnea. EKG on admission showed SR. Trop neg x3. Does have a hx of PAF, on coumadin INR was therapeutic on admission. No echo on file, but denies any dyspnea, or lower extremity edema. Discuss with MD, but does not appear to need further cardiac work up at this time.   -- coumadin has been held, INR 2.5 today. Surgery planned intervention tomorrow. Will need to be started on IV heparin when INR <2.   Signed, Reino Bellis, NP-C Pager [phone] 07/10/2016, 1:42 PM  As above, patient seen and examined. Briefly she is a 70 year old female with past medical history of paroxysmal atrial fibrillation, hypertension, end-stage renal disease hemodialysis dependent, gastroesophageal reflux disease for preoperative evaluation prior to colon surgery for diverticulitis with associated microperforation. Patient moved to this area approximately one year ago from [place]. She has a history of PAF. By her report she had a stress test approximately one year ago prior to back surgery as a preoperative evaluation and states it was normal. Those records are not available. She has some dyspnea on exertion but can ambulate through the mall and store with no chest pain. She does not have orthopnea, PND or pedal edema. Electrocardiogram 06/25/2016 showed sinus  rhythm, poor R-wave progression, prolonged QT interval.   1 preoperative evaluation prior to abdominal surgery for diverticulitis with microperforation-patient can ambulate without significant dyspnea or chest pain. She is somewhat limited because of back pain. She had a  nuclear study by her report approximately one year ago prior to back surgery that was normal. I do not think she requires further cardiac evaluation preoperatively.  2 paroxysmal atrial fibrillation-she is in sinus rhythm. Follow closely postoperatively for recurrent atrial fibrillation. Hold Coumadin preoperatively. Resume after surgery when hemostasis is achieved. CHADSvasc 3. Resume amiodarone postoperatively.  3 end-stage renal disease-dialysis per nephrology.  4 hypertension-follow blood pressure postoperatively and resume home medications as needed.  Kirk Ruths, MD

## 2016-07-10 NOTE — Consult Note (Signed)
[facility] Nurse requested for preoperative stoma site marking  Discussed surgical procedure and stoma creation with patient and family.  Explained role of the [facility] nurse team.  Provided the patient with educational booklet/DVD and provided samples of pouching options.  Answered patient  questions.   Examined patient lying, sitting, and standing in order to place the marking in the patient's visual field, away from any creases or abdominal contour issues and within the rectus muscle.  Attempted to mark below the patient's belt line.   Marked for colostomy in the LLQ  _5___ cm to the left of the umbilicus and __2__HE below the umbilicus.  Patient's abdomen cleansed with CHG wipes at site markings, allowed to air dry prior to marking.Covered mark with thin film transparent dressing to preserve mark until date of surgery.   [facility] Nurse team will follow up with patient after surgery for continue ostomy care and teaching.  Adonay Scheier [place] RN,CWOCN [phone]

## 2016-07-10 NOTE — Progress Notes (Signed)
ANTICOAGULATION CONSULT NOTE - Follow Up Consult  Pharmacy Consult for Coumadin Indication: atrial fibrillation  Patient Measurements: Height: 5\' 5"  (165.1 cm) Weight: 241 lb 4.8 oz (109.5 kg) IBW/kg (Calculated) : 57   Labs:  Recent Labs  07/08/16 0345 07/09/16 0334 07/09/16 0730 07/10/16 0730  HGB 10.4*  --  10.3* 10.3*  HCT 32.1*  --  32.0* 32.3*  PLT 234  --  242 217  LABPROT 36.2* 34.4*  --  28.2*  INR 3.53 3.31  --  2.59  CREATININE 5.18*  --  7.25* 5.14*  ESRD  Assessment:  70 yoF on coumadin PTA for hx Afib.  INR has been supratherapeutic for several days; finally down into therapeutic range today, 2.59. Slow decline.  Last Coumadin dose 07/05/16. Coumadin discontinued for now and to receive Vitamin K 5 mg PO. For surgery on 07/11/16.  PTA Coumadin dose: 5mg  daily except 7.5mg  on Wednesdays and Fridays per anti-coag clinic  Goal of Therapy:  INR 2-3 Monitor platelets by anticoagulation protocol: Yes   Plan:   Coumadin remains on hold  Continue daily PT/INR.  Will follow up post-op 12/22 for anticoagulation plans, possibly IV heparin.  Arty Baumgartner, [place] Pager: [phone] 07/10/2016 2:45 PM

## 2016-07-10 NOTE — Care Management Important Message (Signed)
Important Message  Patient Details  Name: Jane [name] MRN: [id number] Date of Birth: Nov 19, 1945   Medicare Important Message Given:  Yes    Nathen May 07/10/2016, 10:56 AM

## 2016-07-10 NOTE — Progress Notes (Signed)
PROGRESS NOTE  Jane [name] LOV:[id number] DOB: 07-31-1945 DOA: 06/25/2016 PCP: [facility]   LOS: 14 days   Brief Narrative: 70 y.o.BF PMHx ESRD on HD M/W/F, A-Fib on warfarin, HTN, and anemiaWho presents with chest pain for 1 day and abdominal pain for 3 months. Regarding chest pain, this was a new complaint today. The patient went to HD, and during her session, developed a sharp, severe, constant pain under her right breast associated with shortness of breath and nausea. She finished her treatment, then the HD nurse administered O2 and nitroglycerin, which caused her pain to subside, and recommended she go to the ER.  Patient was initially placed on antibiotics with ciprofloxacin and metronidazole, she was very slow to improve then failed advancement of her diet, general surgery was consulted. Her antibodies were transitioned to Zosyn, and another attempt was made to advance her diet as she was appearing to improve clinically over the weekend. However when she got to soft diet her pain returned. She was put on clear liquid diet and repeat CT abd and pelvis ordered showed possible microperforation.  Assessment & Plan: Principal Problem:   Diverticulitis of large intestine with perforation Active Problems:   ESRD on hemodialysis (HCC)   Paroxysmal atrial fibrillation (HCC)   Essential hypertension   Anemia in other chronic diseases classified elsewhere   Chest pain with moderate risk for cardiac etiology  Diverticulitis with perforation - repeat CT scan 12/20 shows  Severe sigmoid diverticulitis with similar contained microperforation, no drainable fluid collection. Considering eccentric nature of the mural thickening, recommend follow-up endoscopy after resolution of acute symptoms to exclude mass - she was initially on Cipro flagyl but now on Zosyn per surgery  - she remains afebrile without any leukocytosis.  - plan for surgical intervention tomorrow. Cardiology cleared  her for surgery.   ESRD on HD - Patient clinically continues to be euvolemic. - HD per nephrology, she is on MWF schedule   HTN - better controlled.  - continue Coreg   Paroxysmal atrial fibrillation - Currently in NSR - continue Coumadin and amiodarone.  - rate controlled.  -  therapeutic INR.    Hyponatremia: probably from ESRD.    Anemia of chronic Renal disease - No signs of bleeding.  Stable around 10.    DVT prophylaxis: Coumadin Code Status: Full Family Communication: no family bedside Disposition Plan: home when ready   Consultants:   General surgery  Cardiology   Nephrology   Procedures:   None   Antimicrobials:  Zosyn 12/14 >>   Subjective: Persistent LLQ pain.   Objective: Vitals:   07/09/16 1145 07/09/16 1244 07/09/16 2150 07/10/16 0500  BP: (!) 150/52 (!) 152/56 (!) 154/55 (!) 136/45  Pulse: 64 71 68 64  Resp: 18 16    Temp: 98 F (36.7 C) 98.2 F (36.8 C) 98 F (36.7 C) 98.1 F (36.7 C)  TempSrc: Oral Oral Oral Oral  SpO2: 100% 100% 97% 91%  Weight: 109.4 kg (241 lb 2.9 oz)   109.5 kg (241 lb 4.8 oz)  Height:        Intake/Output Summary (Last 24 hours) at 07/10/16 1656 Last data filed at 07/10/16 1330  Gross per 24 hour  Intake              723 ml  Output                0 ml  Net  723 ml   Filed Weights   07/09/16 0715 07/09/16 1145 07/10/16 0500  Weight: 111.2 kg (245 lb 2.4 oz) 109.4 kg (241 lb 2.9 oz) 109.5 kg (241 lb 4.8 oz)    Examination: Constitutional: NAD Vitals:   07/09/16 1145 07/09/16 1244 07/09/16 2150 07/10/16 0500  BP: (!) 150/52 (!) 152/56 (!) 154/55 (!) 136/45  Pulse: 64 71 68 64  Resp: 18 16    Temp: 98 F (36.7 C) 98.2 F (36.8 C) 98 F (36.7 C) 98.1 F (36.7 C)  TempSrc: Oral Oral Oral Oral  SpO2: 100% 100% 97% 91%  Weight: 109.4 kg (241 lb 2.9 oz)   109.5 kg (241 lb 4.8 oz)  Height:       Respiratory: clear to auscultation bilaterally, no wheezing, no crackles.    Cardiovascular: Regular rate and rhythm, no murmurs / rubs / gallops. No LE edema. Abdomen: persistent left lower quadrant pain,  Bowel sounds positive.  Musculoskeletal: no clubbing / cyanosis. Neuro: non focal, strength equal     Data Reviewed: I have personally reviewed following labs and imaging studies  CBC:  Recent Labs Lab 07/06/16 0320 07/07/16 0240 07/08/16 0345 07/09/16 0730 07/10/16 0730  WBC 5.3 4.9 6.0 5.6 5.4  HGB 10.6* 10.4* 10.4* 10.3* 10.3*  HCT 32.6* 32.6* 32.1* 32.0* 32.3*  MCV 90.3 88.8 88.4 89.6 89.2  PLT 274 248 234 242 151   Basic Metabolic Panel:  Recent Labs Lab 07/05/16 0215 07/07/16 0240 07/08/16 0345 07/09/16 0730 07/10/16 0730  NA 133* 130* 133* 131* 129*  K 3.9 3.6 3.8 3.6 3.5  CL 93* 95* 95* 93* 93*  CO2 24 23 26 25 26   GLUCOSE 57* 91 92 81 76  BUN 8 16 5* 12 9  CREATININE 4.89* 9.08* 5.18* 7.25* 5.14*  CALCIUM 8.0* 7.6* 7.6* 7.5* 7.5*  PHOS 4.6 5.5* 3.5 4.7*  --    GFR: Estimated Creatinine Clearance: 12.2 mL/min (by C-G formula based on SCr of 5.14 mg/dL (H)). Liver Function Tests:  Recent Labs Lab 07/05/16 0215 07/07/16 0240 07/08/16 0345 07/09/16 0730  ALBUMIN 3.0* 2.8* 2.8* 2.8*   No results for input(s): LIPASE, AMYLASE in the last 168 hours. No results for input(s): AMMONIA in the last 168 hours. Coagulation Profile:  Recent Labs Lab 07/06/16 0320 07/07/16 0240 07/08/16 0345 07/09/16 0334 07/10/16 0730  INR 4.15* 3.93 3.53 3.31 2.59   Cardiac Enzymes: No results for input(s): CKTOTAL, CKMB, CKMBINDEX, TROPONINI in the last 168 hours. BNP (last 3 results) No results for input(s): PROBNP in the last 8760 hours. HbA1C: No results for input(s): HGBA1C in the last 72 hours. CBG:  Recent Labs Lab 07/06/16 0730 07/06/16 1113  GLUCAP 96 115*   Lipid Profile: No results for input(s): CHOL, HDL, LDLCALC, TRIG, CHOLHDL, LDLDIRECT in the last 72 hours. Thyroid Function Tests: No results for input(s):  TSH, T4TOTAL, FREET4, T3FREE, THYROIDAB in the last 72 hours. Anemia Panel: No results for input(s): VITAMINB12, FOLATE, FERRITIN, TIBC, IRON, RETICCTPCT in the last 72 hours. Urine analysis:    Component Value Date/Time   COLORURINE AMBER (A) 06/30/2016 0503   APPEARANCEUR HAZY (A) 06/30/2016 0503   LABSPEC 1.020 06/30/2016 0503   PHURINE 5.0 06/30/2016 0503   GLUCOSEU NEGATIVE 06/30/2016 0503   HGBUR NEGATIVE 06/30/2016 0503   BILIRUBINUR SMALL (A) 06/30/2016 0503   KETONESUR NEGATIVE 06/30/2016 0503   PROTEINUR 30 (A) 06/30/2016 0503   UROBILINOGEN 0.2 06/30/2012 2201   NITRITE NEGATIVE 06/30/2016 0503   LEUKOCYTESUR SMALL (  A) 06/30/2016 0503   Sepsis Labs: Invalid input(s): PROCALCITONIN, LACTICIDVEN  No results found for this or any previous visit (from the past 240 hour(s)).  Radiology Studies: Ct Abdomen Pelvis W Contrast  Result Date: 07/09/2016 CLINICAL DATA:  Follow-up diverticulitis. Recurrent abdominal pain with advanced diet. History of end-stage renal disease on dialysis. EXAM: CT ABDOMEN AND PELVIS WITH CONTRAST TECHNIQUE: Multidetector CT imaging of the abdomen and pelvis was performed using the standard protocol following bolus administration of intravenous contrast. CONTRAST:  100 cc ISOVUE-300 IOPAMIDOL (ISOVUE-300) INJECTION 61% COMPARISON:  CT abdomen and pelvis July 03, 2016. FINDINGS: LOWER CHEST: Lung bases are clear. The heart is mildly enlarged. No pericardial effusions. HEPATOBILIARY: Mild focal fatty infiltration about the falciform ligament, liver is otherwise unremarkable. Mildly distended gallbladder without pericholecystic inflammation or gallstones. PANCREAS: Normal. SPLEEN: Normal. ADRENALS/URINARY TRACT: Kidneys are orthotopic, demonstrating symmetric enhancement. Symmetrically atrophic kidneys. Multiple bilateral renal cysts measure up to 2.2 cm on the RIGHT. No nephrolithiasis, hydronephrosis or solid renal masses. The unopacified ureters are normal  in course and caliber. Delayed imaging through the kidneys demonstrates symmetric prompt contrast excretion within the proximal urinary collecting system. Urinary bladder is partially distended and unremarkable. Normal adrenal glands. STOMACH/BOWEL: Approximate 12 cm segment of sigmoid acute diverticulitis, with mesenteric mural thickening. Pericolonic fat stranding. Contained micro perforation anti mesenteric soft tissues. No drainable fluid collection. No intraperitoneal free air. No bowel obstruction, contrast to the level of the rectum. Small and large bowel air-fluid levels. The stomach, small bowel are normal in course and caliber without inflammatory changes. VASCULAR/LYMPHATIC: Aortoiliac vessels are normal in course and caliber, moderate atherosclerosis. No lymphadenopathy by CT size criteria. REPRODUCTIVE: LEFT adnexae is mildly enlarged with heterogeneous enhancement. OTHER: No intraperitoneal free fluid or free air. MUSCULOSKELETAL: Nonacute. Moderate sacroiliac osteoarthrosis. Broad dextroscoliosis. Status post L4-5 PLIF, non incorporated interbody disc material with trace lucency about the LEFT L5 pedicle screw. IMPRESSION: Severe sigmoid diverticulitis with similar contained microperforation, no drainable fluid collection. Considering eccentric nature of the mural thickening, recommend follow-up endoscopy after resolution of acute symptoms to exclude mass. Presumed reactive changes of the adjacent LEFT adnexum.  Mild ileus. L4-5 PLIF with mild lucency about the L5 pedicle screw and, non incorporated interbody fusion material concerning for hardware failure. Electronically Signed   By: Elon Alas M.D.   On: 07/09/2016 20:44   Ir Fluoro Guide Cv Line Right  Result Date: 07/09/2016 CLINICAL DATA:  Renal failure, poor peripheral venous access, needs durable venous access for antibiotic regimen. EXAM: RIGHT IJ CENTRAL VENOUS CATHETER PLACEMENT UNDER ULTRASOUND AND FLUOROSCOPIC GUIDANCE  FLUOROSCOPY TIME:  0.5 minute (15 uGym2 DAP) TECHNIQUE: The procedure, risks (including but not limited to bleeding, infection, organ damage ), benefits, and alternatives were explained to the patient. Questions regarding the procedure were encouraged and answered. The patient understands and consents to the procedure. Patency of the right IJ vein was confirmed with ultrasound with image documentation. An appropriate skin site was determined. Skin site was marked. Region was prepped using maximum barrier technique including cap and mask, sterile gown, sterile gloves, large sterile sheet, and Chlorhexidine as cutaneous antisepsis. The region was infiltrated locally with 1% lidocaine. Under real-time ultrasound guidance, the right IJ vein was accessed with a 21 gauge micropuncture needle; the needle tip within the vein was confirmed with ultrasound image documentation. The needle exchanged over a guidewire for peel-away sheath through which an 15cm 5 [place] PowerPICC dual lumen catheter was advanced. This was positioned with the tip at the  cavoatrial junction. Spot chest radiograph shows good positioning and no pneumothorax. Catheter was flushed and sutured externally with 0-Prolene sutures. Patient tolerated the procedure well. COMPLICATIONS: None immediate IMPRESSION: 1. Technically successful right IJ double lumen power injectable central venous catheter placement. Electronically Signed   By: Lucrezia Europe M.D.   On: 07/09/2016 14:52   Ir US Guide Vasc Access Right  Result Date: 07/09/2016 CLINICAL DATA:  Renal failure, poor peripheral venous access, needs durable venous access for antibiotic regimen. EXAM: RIGHT IJ CENTRAL VENOUS CATHETER PLACEMENT UNDER ULTRASOUND AND FLUOROSCOPIC GUIDANCE FLUOROSCOPY TIME:  0.5 minute (15 uGym2 DAP) TECHNIQUE: The procedure, risks (including but not limited to bleeding, infection, organ damage ), benefits, and alternatives were explained to the patient. Questions regarding the  procedure were encouraged and answered. The patient understands and consents to the procedure. Patency of the right IJ vein was confirmed with ultrasound with image documentation. An appropriate skin site was determined. Skin site was marked. Region was prepped using maximum barrier technique including cap and mask, sterile gown, sterile gloves, large sterile sheet, and Chlorhexidine as cutaneous antisepsis. The region was infiltrated locally with 1% lidocaine. Under real-time ultrasound guidance, the right IJ vein was accessed with a 21 gauge micropuncture needle; the needle tip within the vein was confirmed with ultrasound image documentation. The needle exchanged over a guidewire for peel-away sheath through which an 15cm 5 [place] PowerPICC dual lumen catheter was advanced. This was positioned with the tip at the cavoatrial junction. Spot chest radiograph shows good positioning and no pneumothorax. Catheter was flushed and sutured externally with 0-Prolene sutures. Patient tolerated the procedure well. COMPLICATIONS: None immediate IMPRESSION: 1. Technically successful right IJ double lumen power injectable central venous catheter placement. Electronically Signed   By: Lucrezia Europe M.D.   On: 07/09/2016 14:52    Scheduled Meds: . amiodarone  200 mg Oral Daily  . amoxicillin-clavulanate  1 tablet Oral Daily  . [START ON 07/11/2016]  ceFAZolin (ANCEF) IV  2 g Intravenous To SS-Surg   And  . [START ON 07/11/2016] metronidazole  500 mg Intravenous To SS-Surg  . Chlorhexidine Gluconate Cloth  6 each Topical Once   And  . Chlorhexidine Gluconate Cloth  6 each Topical Once  . [START ON 07/11/2016] Chlorhexidine Gluconate Cloth  6 each Topical Once  . cinacalcet  30 mg Oral Q supper  . darbepoetin (ARANESP) injection - DIALYSIS  60 mcg Intravenous Q Wed-HD  . docusate sodium  200 mg Oral BID  . feeding supplement  1 Container Oral TID BM  . nystatin  5 mL Oral QID  . sodium chloride flush  3 mL  Intravenous Q12H   Continuous Infusions:   Hosie Poisson , MD,  [facility] Pager [phone]  If 7PM-7AM, please contact night-coverage [email or web address] Password [facility] 07/10/2016, 4:56 PM

## 2016-07-11 ENCOUNTER — Inpatient Hospital Stay (HOSPITAL_COMMUNITY): Payer: Medicare Other | Admitting: Anesthesiology

## 2016-07-11 ENCOUNTER — Encounter (HOSPITAL_COMMUNITY)
Admission: EM | Disposition: A | Discharge status: Home / Self Care | Payer: Self-pay | Source: Home / Self Care | Attending: Internal Medicine

## 2016-07-11 ENCOUNTER — Encounter (HOSPITAL_COMMUNITY): Payer: Self-pay | Admitting: Anesthesiology

## 2016-07-11 HISTORY — PX: LAPAROSCOPIC SIGMOID COLECTOMY: SHX5928

## 2016-07-11 LAB — PROTIME-INR
INR: 1.84
Prothrombin Time: 21.5 seconds — ABNORMAL HIGH (ref 11.4–15.2)

## 2016-07-11 LAB — BASIC METABOLIC PANEL
Anion gap: 10 (ref 5–15)
BUN: 15 mg/dL (ref 6–20)
CHLORIDE: 96 mmol/L — AB (ref 101–111)
CO2: 24 mmol/L (ref 22–32)
Calcium: 7.1 mg/dL — ABNORMAL LOW (ref 8.9–10.3)
Creatinine, Ser: 6.9 mg/dL — ABNORMAL HIGH (ref 0.44–1.00)
GFR calc Af Amer: 6 mL/min — ABNORMAL LOW (ref >60)
GFR calc non Af Amer: 5 mL/min — ABNORMAL LOW (ref >60)
GLUCOSE: 75 mg/dL (ref 65–99)
POTASSIUM: 3.9 mmol/L (ref 3.5–5.1)
Sodium: 130 mmol/L — ABNORMAL LOW (ref 135–145)

## 2016-07-11 SURGERY — COLECTOMY, SIGMOID, LAPAROSCOPIC
Anesthesia: General | Site: Abdomen

## 2016-07-11 MED ORDER — SODIUM CHLORIDE 0.9% FLUSH: 10.0000 mL | INTRAVENOUS | Status: DC | PRN

## 2016-07-11 MED ORDER — HYDROMORPHONE 1 MG/ML IV SOLN
INTRAVENOUS | Status: DC
  Administered 2016-07-11: 0.6 mg via INTRAVENOUS
  Administered 2016-07-11: 14:00:00 via INTRAVENOUS
  Administered 2016-07-11: 0.2 mg via INTRAVENOUS
  Administered 2016-07-12: 0.6 mg via INTRAVENOUS
  Administered 2016-07-12: 1.4 mg via INTRAVENOUS
  Administered 2016-07-12: 0.8 mg via INTRAVENOUS
  Administered 2016-07-12: 0.4 mg via INTRAVENOUS
  Administered 2016-07-12: 0.2 mg via INTRAVENOUS
  Administered 2016-07-12: 0.6 mg via INTRAVENOUS

## 2016-07-11 MED ORDER — NALOXONE HCL 0.4 MG/ML IJ SOLN: 0.4000 mg | INTRAMUSCULAR | Status: DC | PRN

## 2016-07-11 MED ORDER — HYDROMORPHONE HCL 1 MG/ML IJ SOLN: 0.2500 mg | INTRAMUSCULAR | Status: DC | PRN

## 2016-07-11 MED ORDER — SUCCINYLCHOLINE CHLORIDE 20 MG/ML IJ SOLN
INTRAMUSCULAR | Status: DC | PRN
  Administered 2016-07-11: 20 mg via INTRAVENOUS

## 2016-07-11 MED ORDER — FENTANYL CITRATE (PF) 100 MCG/2ML IJ SOLN
INTRAMUSCULAR | Status: AC
  Filled 2016-07-11: qty 2

## 2016-07-11 MED ORDER — SODIUM CHLORIDE 0.9 % IR SOLN
Status: DC | PRN
  Administered 2016-07-11: 1000 mL

## 2016-07-11 MED ORDER — ROCURONIUM BROMIDE 100 MG/10ML IV SOLN
INTRAVENOUS | Status: DC | PRN
  Administered 2016-07-11: 50 mg via INTRAVENOUS
  Administered 2016-07-11: 10 mg via INTRAVENOUS

## 2016-07-11 MED ORDER — ROCURONIUM BROMIDE 10 MG/ML (PF) SYRINGE
PREFILLED_SYRINGE | INTRAVENOUS | Status: AC
  Filled 2016-07-11: qty 5

## 2016-07-11 MED ORDER — SUGAMMADEX SODIUM 200 MG/2ML IV SOLN
INTRAVENOUS | Status: DC | PRN
  Administered 2016-07-11: 350 mg via INTRAVENOUS

## 2016-07-11 MED ORDER — BUPIVACAINE-EPINEPHRINE 0.25% -1:200000 IJ SOLN
INTRAMUSCULAR | Status: DC | PRN
  Administered 2016-07-11: .09 mL

## 2016-07-11 MED ORDER — FENTANYL CITRATE (PF) 100 MCG/2ML IJ SOLN
INTRAMUSCULAR | Status: DC | PRN
  Administered 2016-07-11 (×6): 50 ug via INTRAVENOUS

## 2016-07-11 MED ORDER — LIDOCAINE 2% (20 MG/ML) 5 ML SYRINGE
INTRAMUSCULAR | Status: AC
  Filled 2016-07-11: qty 5

## 2016-07-11 MED ORDER — PROPOFOL 10 MG/ML IV BOLUS
INTRAVENOUS | Status: DC | PRN
  Administered 2016-07-11: 140 mg via INTRAVENOUS

## 2016-07-11 MED ORDER — LIDOCAINE HCL (CARDIAC) 20 MG/ML IV SOLN
INTRAVENOUS | Status: DC | PRN
  Administered 2016-07-11: 100 mg via INTRAVENOUS

## 2016-07-11 MED ORDER — BUPIVACAINE-EPINEPHRINE (PF) 0.25% -1:200000 IJ SOLN
INTRAMUSCULAR | Status: AC
  Filled 2016-07-11: qty 30

## 2016-07-11 MED ORDER — SUGAMMADEX SODIUM 500 MG/5ML IV SOLN
INTRAVENOUS | Status: AC
  Filled 2016-07-11: qty 5

## 2016-07-11 MED ORDER — 0.9 % SODIUM CHLORIDE (POUR BTL) OPTIME
TOPICAL | Status: DC | PRN
  Administered 2016-07-11 (×2): 1000 mL

## 2016-07-11 MED ORDER — SUCCINYLCHOLINE CHLORIDE 200 MG/10ML IV SOSY
PREFILLED_SYRINGE | INTRAVENOUS | Status: AC
  Filled 2016-07-11: qty 10

## 2016-07-11 MED ORDER — PROMETHAZINE HCL 25 MG/ML IJ SOLN: 6.2500 mg | INTRAMUSCULAR | Status: DC | PRN

## 2016-07-11 MED ORDER — MIDAZOLAM HCL 2 MG/2ML IJ SOLN
INTRAMUSCULAR | Status: AC
  Filled 2016-07-11: qty 2

## 2016-07-11 MED ORDER — ACETAMINOPHEN 10 MG/ML IV SOLN
INTRAVENOUS | Status: AC
  Filled 2016-07-11: qty 100

## 2016-07-11 MED ORDER — PHENYLEPHRINE HCL 10 MG/ML IJ SOLN
INTRAVENOUS | Status: DC | PRN
  Administered 2016-07-11: 25 ug/min via INTRAVENOUS

## 2016-07-11 MED ORDER — PROPOFOL 10 MG/ML IV BOLUS
INTRAVENOUS | Status: AC
  Filled 2016-07-11: qty 20

## 2016-07-11 MED ORDER — ONDANSETRON HCL 4 MG/2ML IJ SOLN
INTRAMUSCULAR | Status: AC
  Filled 2016-07-11: qty 2

## 2016-07-11 MED ORDER — OXYCODONE HCL 5 MG PO TABS
5.0000 mg | ORAL_TABLET | ORAL | Status: DC | PRN
  Administered 2016-07-15: 5 mg via ORAL
  Filled 2016-07-11: qty 1

## 2016-07-11 MED ORDER — ACETAMINOPHEN 10 MG/ML IV SOLN
INTRAVENOUS | Status: DC | PRN
  Administered 2016-07-11: 1000 mg via INTRAVENOUS

## 2016-07-11 MED ORDER — PIPERACILLIN-TAZOBACTAM 3.375 G IVPB
3.3750 g | Freq: Three times a day (TID) | INTRAVENOUS | Status: AC
Start: 2016-07-11 — End: 2016-07-12
  Administered 2016-07-11 – 2016-07-12 (×3): 3.375 g via INTRAVENOUS
  Filled 2016-07-11 (×3): qty 50

## 2016-07-11 MED ORDER — FENTANYL CITRATE (PF) 100 MCG/2ML IJ SOLN
INTRAMUSCULAR | Status: AC
  Filled 2016-07-11: qty 4

## 2016-07-11 MED ORDER — PHENYLEPHRINE HCL 10 MG/ML IJ SOLN
INTRAMUSCULAR | Status: DC | PRN
  Administered 2016-07-11 (×3): 80 ug via INTRAVENOUS

## 2016-07-11 MED ORDER — SODIUM CHLORIDE 0.9% FLUSH: 9.0000 mL | INTRAVENOUS | Status: DC | PRN

## 2016-07-11 MED ORDER — HEPARIN SODIUM (PORCINE) 5000 UNIT/ML IJ SOLN
5000.0000 [IU] | Freq: Three times a day (TID) | INTRAMUSCULAR | Status: DC
  Administered 2016-07-11 – 2016-07-15 (×12): 5000 [IU] via SUBCUTANEOUS
  Filled 2016-07-11 (×12): qty 1

## 2016-07-11 MED ORDER — PIPERACILLIN-TAZOBACTAM 3.375 G IVPB
3.3750 g | Freq: Once | INTRAVENOUS | Status: AC
  Administered 2016-07-11: 3.375 g via INTRAVENOUS
  Filled 2016-07-11: qty 50

## 2016-07-11 MED ORDER — PHENYLEPHRINE 40 MCG/ML (10ML) SYRINGE FOR IV PUSH (FOR BLOOD PRESSURE SUPPORT)
PREFILLED_SYRINGE | INTRAVENOUS | Status: AC
  Filled 2016-07-11: qty 10

## 2016-07-11 MED ORDER — ALBUMIN HUMAN 5 % IV SOLN
INTRAVENOUS | Status: DC | PRN
  Administered 2016-07-11: 10:00:00 via INTRAVENOUS

## 2016-07-11 MED ORDER — SODIUM CHLORIDE 0.9 % IV SOLN
INTRAVENOUS | Status: DC | PRN
  Administered 2016-07-11 (×2): via INTRAVENOUS

## 2016-07-11 MED ORDER — MIDAZOLAM HCL 5 MG/5ML IJ SOLN
INTRAMUSCULAR | Status: DC | PRN
  Administered 2016-07-11 (×2): 1 mg via INTRAVENOUS

## 2016-07-11 MED ORDER — HYDROMORPHONE 1 MG/ML IV SOLN
INTRAVENOUS | Status: AC
  Filled 2016-07-11: qty 25

## 2016-07-11 SURGICAL SUPPLY — 81 items
APPLIER CLIP ROT 10 11.4 M/L (STAPLE)
BLADE SURG ROTATE 9660 (MISCELLANEOUS) ×2 IMPLANT
BNDG COHESIVE 3X5 WHT NS (GAUZE/BANDAGES/DRESSINGS) ×6 IMPLANT
CANISTER SUCTION 2500CC (MISCELLANEOUS) ×2 IMPLANT
CHLORAPREP W/TINT 26ML (MISCELLANEOUS) ×2 IMPLANT
CLIP APPLIE ROT 10 11.4 M/L (STAPLE) IMPLANT
COVER MAYO STAND STRL (DRAPES) ×4 IMPLANT
COVER SURGICAL LIGHT HANDLE (MISCELLANEOUS) ×4 IMPLANT
DECANTER SPIKE VIAL GLASS SM (MISCELLANEOUS) ×2 IMPLANT
DRAPE PROXIMA HALF (DRAPES) ×2 IMPLANT
DRAPE UTILITY XL STRL (DRAPES) ×10 IMPLANT
DRAPE WARM FLUID 44X44 (DRAPE) ×2 IMPLANT
DRSG OPSITE POSTOP 4X10 (GAUZE/BANDAGES/DRESSINGS) IMPLANT
DRSG OPSITE POSTOP 4X8 (GAUZE/BANDAGES/DRESSINGS) IMPLANT
ELECT BLADE 6.5 EXT (BLADE) ×2 IMPLANT
ELECT CAUTERY BLADE 6.4 (BLADE) ×4 IMPLANT
ELECT REM PT RETURN 9FT ADLT (ELECTROSURGICAL) ×2
ELECTRODE REM PT RTRN 9FT ADLT (ELECTROSURGICAL) ×1 IMPLANT
GEL ULTRASOUND 20GR AQUASONIC (MISCELLANEOUS) ×2 IMPLANT
GLOVE BIO SURGEON STRL SZ7 (GLOVE) ×4 IMPLANT
GLOVE BIOGEL PI IND STRL 6.5 (GLOVE) ×2 IMPLANT
GLOVE BIOGEL PI IND STRL 7.0 (GLOVE) ×2 IMPLANT
GLOVE BIOGEL PI IND STRL 7.5 (GLOVE) ×2 IMPLANT
GLOVE BIOGEL PI IND STRL 8 (GLOVE) ×2 IMPLANT
GLOVE BIOGEL PI INDICATOR 6.5 (GLOVE) ×2
GLOVE BIOGEL PI INDICATOR 7.0 (GLOVE) ×2
GLOVE BIOGEL PI INDICATOR 7.5 (GLOVE) ×2
GLOVE BIOGEL PI INDICATOR 8 (GLOVE) ×2
GLOVE INDICATOR 7.0 STRL GRN (GLOVE) ×4 IMPLANT
GLOVE SURG SS PI 6.0 STRL IVOR (GLOVE) ×2 IMPLANT
GLOVE SURG SS PI 6.5 STRL IVOR (GLOVE) ×8 IMPLANT
GLOVE SURG SS PI 7.0 STRL IVOR (GLOVE) ×6 IMPLANT
GLOVE SURG SS PI 7.5 STRL IVOR (GLOVE) ×4 IMPLANT
GOWN STRL REUS W/ TWL LRG LVL3 (GOWN DISPOSABLE) ×12 IMPLANT
GOWN STRL REUS W/TWL LRG LVL3 (GOWN DISPOSABLE) ×12
KIT BASIN OR (CUSTOM PROCEDURE TRAY) ×2 IMPLANT
KIT PREVENA INCISION MGT 13 (CANNISTER) ×2 IMPLANT
KIT ROOM TURNOVER OR (KITS) ×2 IMPLANT
LEGGING LITHOTOMY PAIR STRL (DRAPES) ×2 IMPLANT
LIGASURE IMPACT 36 18CM CVD LR (INSTRUMENTS) IMPLANT
LIGASURE MARYLAND LAP STAND (ELECTROSURGICAL) IMPLANT
NS IRRIG 1000ML POUR BTL (IV SOLUTION) ×4 IMPLANT
PAD ARMBOARD 7.5X6 YLW CONV (MISCELLANEOUS) ×4 IMPLANT
PENCIL BUTTON HOLSTER BLD 10FT (ELECTRODE) ×4 IMPLANT
SCALPEL HARMONIC ACE (MISCELLANEOUS) ×2 IMPLANT
SCISSORS LAP 5X35 DISP (ENDOMECHANICALS) ×2 IMPLANT
SEALER TISSUE G2 STRG ARTC 35C (ENDOMECHANICALS) ×2 IMPLANT
SEPRAFILM PROCEDURAL PACK 3X5 (MISCELLANEOUS) ×2 IMPLANT
SET IRRIG TUBING LAPAROSCOPIC (IRRIGATION / IRRIGATOR) ×2 IMPLANT
SLEEVE ENDOPATH XCEL 5M (ENDOMECHANICALS) ×4 IMPLANT
SOL PREP POV-IOD 4OZ 10% (MISCELLANEOUS) ×2 IMPLANT
SPECIMEN JAR LARGE (MISCELLANEOUS) ×2 IMPLANT
SPONGE LAP 18X18 X RAY DECT (DISPOSABLE) ×4 IMPLANT
STAPLER CIRC CVD 29MM 37CM (STAPLE) ×2 IMPLANT
STAPLER CUT CVD 40MM GREEN (STAPLE) ×2 IMPLANT
STAPLER CUT RELOAD GREEN (STAPLE) ×2 IMPLANT
STAPLER VISISTAT 35W (STAPLE) ×2 IMPLANT
SURGILUBE 2OZ TUBE FLIPTOP (MISCELLANEOUS) ×2 IMPLANT
SUT PDS AB 1 CT  36 (SUTURE) ×2
SUT PDS AB 1 CT 36 (SUTURE) ×2 IMPLANT
SUT PROLENE 2 0 CT2 30 (SUTURE) ×2 IMPLANT
SUT PROLENE 2 0 KS (SUTURE) IMPLANT
SUT SILK 2 0 SH CR/8 (SUTURE) ×2 IMPLANT
SUT SILK 2 0 TIES 10X30 (SUTURE) ×2 IMPLANT
SUT SILK 3 0 SH CR/8 (SUTURE) ×2 IMPLANT
SUT SILK 3 0 TIES 10X30 (SUTURE) ×2 IMPLANT
SUT VIC AB 3-0 SH 8-18 (SUTURE) ×2 IMPLANT
SYR BULB IRRIGATION 50ML (SYRINGE) ×2 IMPLANT
SYRINGE 10CC LL (SYRINGE) ×2 IMPLANT
SYS LAPSCP GELPORT 120MM (MISCELLANEOUS) ×2
SYSTEM LAPSCP GELPORT 120MM (MISCELLANEOUS) ×1 IMPLANT
TOWEL OR 17X26 10 PK STRL BLUE (TOWEL DISPOSABLE) ×4 IMPLANT
TRAY FOLEY CATH SILVER 16FR LF (SET/KITS/TRAYS/PACK) ×2 IMPLANT
TRAY LAPAROSCOPIC MC (CUSTOM PROCEDURE TRAY) ×2 IMPLANT
TRAY PROCTOSCOPIC FIBER OPTIC (SET/KITS/TRAYS/PACK) ×2 IMPLANT
TROCAR XCEL 12X100 BLDLESS (ENDOMECHANICALS) IMPLANT
TROCAR XCEL NON-BLD 11X100MML (ENDOMECHANICALS) ×2 IMPLANT
TROCAR XCEL NON-BLD 5MMX100MML (ENDOMECHANICALS) ×2 IMPLANT
TUBE CONNECTING 12X1/4 (SUCTIONS) ×4 IMPLANT
TUBING INSUFFLATION (TUBING) ×2 IMPLANT
YANKAUER SUCT BULB TIP NO VENT (SUCTIONS) ×4 IMPLANT

## 2016-07-11 NOTE — Transfer of Care (Signed)
Immediate Anesthesia Transfer of Care Note  Patient: Jane [name]  Procedure(s) Performed: Procedure(s): LAPAROSCOPIC SIGMOID COLECTOMY (N/A)  Patient Location: PACU  Anesthesia Type:General  Level of Consciousness: awake, alert , sedated, patient cooperative and responds to stimulation  Airway & Oxygen Therapy: Patient Spontanous Breathing and Patient connected to face mask oxygen  Post-op Assessment: Report given to RN, Post -op Vital signs reviewed and stable and Patient moving all extremities  Post vital signs: Reviewed and stable  Last Vitals:  Vitals:   07/11/16 0500 07/11/16 1300  BP: (!) 127/46 (!) 118/42  Pulse: 68 62  Resp: (!) 188 10  Temp: 37 C 36.1 C    Last Pain:  Vitals:   07/11/16 0500  TempSrc: Oral  PainSc:       Patients Stated Pain Goal: 0 (52/08/02 2336)  Complications: No apparent anesthesia complications

## 2016-07-11 NOTE — Anesthesia Postprocedure Evaluation (Signed)
Anesthesia Post Note  Patient: Jane [name]  Procedure(s) Performed: Procedure(s) (LRB): LAPAROSCOPIC SIGMOID COLECTOMY (N/A)  Patient location during evaluation: PACU Anesthesia Type: General Level of consciousness: awake, sedated, oriented and patient cooperative Pain management: pain level controlled Vital Signs Assessment: post-procedure vital signs reviewed and stable Respiratory status: spontaneous breathing, nonlabored ventilation, respiratory function stable and patient connected to nasal cannula oxygen Cardiovascular status: blood pressure returned to baseline and stable Postop Assessment: no signs of nausea or vomiting Anesthetic complications: no       Last Vitals:  Vitals:   07/11/16 1500 07/11/16 1509  BP: (!) 127/43   Pulse: (!) 57   Resp: 16 12  Temp: 36.6 C     Last Pain:  Vitals:   07/11/16 1500  TempSrc: Oral  PainSc:                  [name],JAMES TERRILL

## 2016-07-11 NOTE — Progress Notes (Signed)
S: Pain with BM overnight.   Vitals, labs, intake/output, and orders reviewed at this time.  Gen: A&Ox3, no distress  H&N: EOMI, atraumatic, neck supple Chest: unlabored respirations, RRR Abd: soft, tender LLQ, nondistended Ext: warm, no edema Neuro: grossly normal  Lines/tubes/drains: AVF, CVL  A/P:  To OR today for lap-assisted sigmoid colectomy. We discussed the surgery and risks involved including bleeding, pain, scarring, injury to intraabdominal structures, leak, Mi, stroke, blood clots. She will need her labs specifically INR completed before Or.    [name], MD [facility], [place] Pager [phone]

## 2016-07-11 NOTE — Op Note (Signed)
Operative Note  Jane [name]  [id number]  [id number]  07/11/2016   Surgeon: Clovis Riley  Assistant: Judeth Horn  Procedure performed: laparoscopic-assisted sigmoid colectomy with colorectal anastomosis, mobilization of splenic flexure  Preop diagnosis: diverticulitis with microperforation  Post-op diagnosis/intraop findings: diverticulitis with inflammatory adhesions to the uterus and abdominal wall  Specimens: sigmoid colon   Retained items: none EBL: [id number] Complications: none  Description of procedure: After obtaining informed consent the patient was taken to the operating room and placed supine on operating room table wheregeneral endotracheal anesthesia was initiated, preoperative antibiotics were administered, SCDs applied, and a formal timeout was performed. She was placed in lithotomy position with all pressure points appropriately padded. The abdomen was prepped and draped in the usual sterile fashion. The peritoneal cavity was accessed via visiport in the left upper quadrant, insufflated to 62mmHg and inspected; no injury from our entry and no intraabdominal adhesions were seen. Two left hemiabdomen 65mm trocars were introduced under direct visualization and a 10cm low vertical incision was made in the region of her prior scar for a handport. She was placed in trendelenburg and rotated to the right. The small bowel and omentum were reflected superiorly and the area of inflammation was immediately visible left lower quadrant. This was a about a 15 cm length of sigmoid colon which was significantly inflamed and very firm, it was adherent to the posterior aspect of the uterus, the fallopian tube and the lateral abdominal wall. The descending colon was mobilized by creating a small window just inferior to the inferior mesenteric artery and then using blunt finger dissection the plane was developed between the sigmoid colon mesentery and the retroperitoneum. We then continued this  plane superior to the inferior mesenteric artery out to the splenic flexure. Following this the lateral attachments of the descending colon and splenic flexure were taken down using Enseal until the left colon had been complete and mobilized. Attention was then turned to the area of inflammation, a combination of blunt and sharp dissection were attempted laparoscopically to free the inflamed segment from its inflammatory adhesions without much success. At this point the laparoscopic portion of the procedure was terminated removed the GelPort, leaving the Lexus wound protector in place and via the hand port incision proceeded to complete the mobilization of the inflamed segment of sigmoid colon using blunt dissection to divided away from the posterior uterus/adnexa and lateral abdominal wall. Once this was completed the inflamed segment was able to be brought up into the field, a posterior window was made behind the soft part of the rectum and a contour green stapler was introduced dividing the healthy rectum distally from the inflamed segment of sigmoid proximally. The staple line was inspected and found to be well perfused and intact. We then proceeded proximally to the diverticulitis segment in the sigmoid colon was divided in similar fashion using another fire of the contour green. The mesentery leading to the offending segment was then divided using serial fires of the Enseal. The IMA stump was reinforced with a 2-0 silk tie. Hemostasis was ensured within the wound. The proximal segment was easily able to reach and overlap the rectum distally. We then proceeded to complete her colorectal anastomosis with a 29 EEA stapler. The staple line on the distal colon was excised and the anvil was introduced and secured with a pursestring of Prolene suture. The EEA was then introduced by Dr. Hulen Skains and the spike was directed anterior to the rectal staple line. The anvil and spike  were then connected and the stapler closed,  fired and removed without incident. The descending colon was then manually occluded under irrigation while air was introduced via the rigid sigmoidoscope, no bubbles were seen. The lateral aspects of the staple line were reinforced with 3-0 Vicryl sutures. The abdomen was then irrigated and inspected for hemostasis. The omentum was brought back down to lie over the anastomosis and then several sheets of Seprafilm were introduced in the abdomen. The wound protector and by postoperative trochars were removed. At this point the ERAS protocol was initiated with new instruments, gloves gowns, drapes.  The midline fascia was reapproximated with #1 PDS sutures. The wound was irrigated once more, and then all skin incisions were closed with staples. The port sites were covered with Band-Aids, and improving and was placed over the low midline incision. The patient was then awakened, extubated and taken to PACU in stable condition.   All counts were correct at the completion of the case.

## 2016-07-11 NOTE — Progress Notes (Signed)
Assessment: 1 Acute diverticulitis/ microperforation-s/p colectomy and anastomosis 2 ESRD HD mwf, HD Sat if labs ok post op 3 HTN - BP work on vol 4 Atypical CP - trops neg, no further w/u per cards 5 Hx Afib - in NSR, since DCCV 6 yrs ago ([facility]), on amio/ coreg/ coumadin   Subjective: Interval History: Post op  Objective: Vital signs in last 24 hours: Temp:  [97 F (36.1 [name])-98.6 F (37 [name])] 97.8 F (36.6 [name]) (12/22 1500) Pulse Rate:  [56-72] 57 (12/22 1500) Resp:  [10-188] 12 (12/22 1509) BP: (117-180)/(42-55) 127/43 (12/22 1500) SpO2:  [95 %-100 %] 95 % (12/22 1509) Weight:  [109.2 kg (240 lb 12.8 oz)] 109.2 kg (240 lb 12.8 oz) (12/22 0500) Weight change: -1.974 kg (-4 lb 5.6 oz)  Intake/Output from previous day: 12/21 0701 - 12/22 0700 In: 520 [P.O.:520] Out: -  Intake/Output this shift: Total I/O In: 850 [I.V.:600; IV Piggyback:250] Out: 250 [Blood:250]  General appearance: alert and cooperative  Facial swelling slight. Moves appropriately Abd Post op  Lab Results:  Recent Labs  07/09/16 0730 07/10/16 0730  WBC 5.6 5.4  HGB 10.3* 10.3*  HCT 32.0* 32.3*  PLT 242 217   BMET:  Recent Labs  07/09/16 0730 07/10/16 0730  NA 131* 129*  K 3.6 3.5  CL 93* 93*  CO2 25 26  GLUCOSE 81 76  BUN 12 9  CREATININE 7.25* 5.14*  CALCIUM 7.5* 7.5*   No results for input(s): PTH in the last 72 hours. Iron Studies: No results for input(s): IRON, TIBC, TRANSFERRIN, FERRITIN in the last 72 hours. Studies/Results: Ct Abdomen Pelvis W Contrast  Result Date: 07/09/2016 CLINICAL DATA:  Follow-up diverticulitis. Recurrent abdominal pain with advanced diet. History of end-stage renal disease on dialysis. EXAM: CT ABDOMEN AND PELVIS WITH CONTRAST TECHNIQUE: Multidetector CT imaging of the abdomen and pelvis was performed using the standard protocol following bolus administration of intravenous contrast. CONTRAST:  100 cc ISOVUE-300 IOPAMIDOL (ISOVUE-300) INJECTION 61%  COMPARISON:  CT abdomen and pelvis July 03, 2016. FINDINGS: LOWER CHEST: Lung bases are clear. The heart is mildly enlarged. No pericardial effusions. HEPATOBILIARY: Mild focal fatty infiltration about the falciform ligament, liver is otherwise unremarkable. Mildly distended gallbladder without pericholecystic inflammation or gallstones. PANCREAS: Normal. SPLEEN: Normal. ADRENALS/URINARY TRACT: Kidneys are orthotopic, demonstrating symmetric enhancement. Symmetrically atrophic kidneys. Multiple bilateral renal cysts measure up to 2.2 cm on the RIGHT. No nephrolithiasis, hydronephrosis or solid renal masses. The unopacified ureters are normal in course and caliber. Delayed imaging through the kidneys demonstrates symmetric prompt contrast excretion within the proximal urinary collecting system. Urinary bladder is partially distended and unremarkable. Normal adrenal glands. STOMACH/BOWEL: Approximate 12 cm segment of sigmoid acute diverticulitis, with mesenteric mural thickening. Pericolonic fat stranding. Contained micro perforation anti mesenteric soft tissues. No drainable fluid collection. No intraperitoneal free air. No bowel obstruction, contrast to the level of the rectum. Small and large bowel air-fluid levels. The stomach, small bowel are normal in course and caliber without inflammatory changes. VASCULAR/LYMPHATIC: Aortoiliac vessels are normal in course and caliber, moderate atherosclerosis. No lymphadenopathy by CT size criteria. REPRODUCTIVE: LEFT adnexae is mildly enlarged with heterogeneous enhancement. OTHER: No intraperitoneal free fluid or free air. MUSCULOSKELETAL: Nonacute. Moderate sacroiliac osteoarthrosis. Broad dextroscoliosis. Status post L4-5 PLIF, non incorporated interbody disc material with trace lucency about the LEFT L5 pedicle screw. IMPRESSION: Severe sigmoid diverticulitis with similar contained microperforation, no drainable fluid collection. Considering eccentric nature of the  mural thickening, recommend follow-up endoscopy after resolution of  acute symptoms to exclude mass. Presumed reactive changes of the adjacent LEFT adnexum.  Mild ileus. L4-5 PLIF with mild lucency about the L5 pedicle screw and, non incorporated interbody fusion material concerning for hardware failure. Electronically Signed   By: Elon Alas M.D.   On: 07/09/2016 20:44    Scheduled: . amiodarone  200 mg Oral Daily  . Chlorhexidine Gluconate Cloth  6 each Topical Once  . cinacalcet  30 mg Oral Q supper  . darbepoetin (ARANESP) injection - DIALYSIS  60 mcg Intravenous Q Wed-HD  . docusate sodium  200 mg Oral BID  . heparin  5,000 Units Subcutaneous Q8H  . HYDROmorphone   Intravenous Q4H  . HYDROmorphone      . nystatin  5 mL Oral QID  . piperacillin-tazobactam (ZOSYN)  IV  3.375 g Intravenous Q8H  . sodium chloride flush  3 mL Intravenous Q12H     LOS: 15 days   Jane [name] 07/11/2016,3:43 PM

## 2016-07-11 NOTE — Anesthesia Procedure Notes (Signed)
Procedure Name: Intubation Date/Time: 07/11/2016 9:26 AM Performed by: Rebekah Chesterfield L Pre-anesthesia Checklist: Patient identified, Emergency Drugs available, Suction available and Patient being monitored Patient Re-evaluated:Patient Re-evaluated prior to inductionOxygen Delivery Method: Circle System Utilized Preoxygenation: Pre-oxygenation with 100% oxygen Intubation Type: IV induction Ventilation: Mask ventilation without difficulty Laryngoscope Size: Mac and 4 Grade View: Grade I Tube type: Oral Tube size: 7.5 mm Number of attempts: 1 Airway Equipment and Method: Stylet and Oral airway Placement Confirmation: ETT inserted through vocal cords under direct vision,  positive ETCO2 and breath sounds checked- equal and bilateral Secured at: 20 cm Tube secured with: Tape Dental Injury: Teeth and Oropharynx as per pre-operative assessment

## 2016-07-11 NOTE — Anesthesia Preprocedure Evaluation (Signed)
Anesthesia Evaluation  Patient identified by MRN, date of birth, ID band Patient awake    Reviewed: Allergy & Precautions, NPO status , Patient's Chart, lab work & pertinent test results  Airway Mallampati: II  TM Distance: >3 FB Neck ROM: Full    Dental  (+) Teeth Intact   Pulmonary sleep apnea ,     + decreased breath sounds      Cardiovascular hypertension,  Rhythm:Regular Rate:Normal     Neuro/Psych negative neurological ROS     GI/Hepatic GERD  ,Perforated diverticulitis   Endo/Other  Morbid obesity  Renal/GU ESRFRenal disease     Musculoskeletal   Abdominal (+) + obese,   Peds  Hematology  (+) anemia ,   Anesthesia Other Findings   Reproductive/Obstetrics                             Anesthesia Physical Anesthesia Plan  ASA: III  Anesthesia Plan: General   Post-op Pain Management:    Induction: Intravenous  Airway Management Planned: Oral ETT  Additional Equipment:   Intra-op Plan:   Post-operative Plan: Extubation in OR and Possible Post-op intubation/ventilation  Informed Consent: I have reviewed the patients History and Physical, chart, labs and discussed the procedure including the risks, benefits and alternatives for the proposed anesthesia with the patient or authorized representative who has indicated his/her understanding and acceptance.     Plan Discussed with:   Anesthesia Plan Comments:         Anesthesia Quick Evaluation

## 2016-07-11 NOTE — Progress Notes (Signed)
PROGRESS NOTE  Jane [name] LKJ:[id number] DOB: [birth year] DOA: 06/25/2016 PCP: [place] [facility]   LOS: 15 days   Brief Narrative: 70 y.o.BF PMHx ESRD on HD M/W/F, A-Fib on warfarin, HTN, and anemiaWho presents with chest pain for 1 day and abdominal pain for 3 months. Regarding chest pain, this was a new complaint today. The patient went to HD, and during her session, developed a sharp, severe, constant pain under her right breast associated with shortness of breath and nausea. She finished her treatment, then the HD nurse administered O2 and nitroglycerin, which caused her pain to subside, and recommended she go to the ER.  Patient was initially placed on antibiotics with ciprofloxacin and metronidazole, she was very slow to improve then failed advancement of her diet, general surgery was consulted. Her antibodies were transitioned to Zosyn, and another attempt was made to advance her diet as she was appearing to improve clinically over the weekend. However when she got to soft diet her pain returned. She was put on clear liquid diet and repeat CT abd and pelvis ordered showed possible microperforation. She was taken to OR on 12/22 for lap sigmoid colectomy with colorectal anastomosis and mobilization of splenic flexure.   Assessment & Plan: Principal Problem:   Diverticulitis of large intestine with perforation Active Problems:   ESRD on hemodialysis (HCC)   Paroxysmal atrial fibrillation (HCC)   Essential hypertension   Anemia in other chronic diseases classified elsewhere   Chest pain with moderate risk for cardiac etiology  Diverticulitis with perforation - repeat CT scan 12/20 shows  Severe sigmoid diverticulitis with similar contained microperforation, no drainable fluid collection. Considering eccentric nature of the mural thickening, recommend follow-up endoscopy after resolution of acute symptoms to exclude mass - she was initially on Cipro flagyl but now on Zosyn per  surgery  - though she remains afebrile without any leukocytosis, she has persistent abdominal pain , .  - she underwent lap sigmoid colectomy with colorectal anastomosis and splenic flexure mobilization on 12/22.   Pain control.   ESRD on HD - Patient clinically continues to be euvolemic. - HD per nephrology, she is on MWF schedule   HTN - better controlled.  - continue Coreg   Paroxysmal atrial fibrillation - Currently in NSR - coumadin held for procedure, will resume it after discussing with surgery.   Hyponatremia: probably from ESRD.    Anemia of chronic Renal disease - No signs of bleeding.  Stable around 10.    DVT prophylaxis: Coumadin Code Status: Full Family Communication: no family bedside Disposition Plan: home when ready   Consultants:   General surgery  Cardiology   Nephrology   Procedures:   None   Antimicrobials:  Zosyn 12/14 >>   Subjective: Pain is better.   Objective: Vitals:   07/11/16 1420 07/11/16 1430 07/11/16 1500 07/11/16 1509  BP: (!) 121/44 (!) 124/46 (!) 127/43   Pulse: (!) 56 (!) 57 (!) 57   Resp: 18 16 16 12   Temp:  97.5 F (36.4 C) 97.8 F (36.6 C)   TempSrc:   Oral   SpO2: 99% 100% 98% 95%  Weight:      Height:        Intake/Output Summary (Last 24 hours) at 07/11/16 1656 Last data filed at 07/11/16 1300  Gross per 24 hour  Intake              970 ml  Output  250 ml  Net              720 ml   Filed Weights   07/09/16 1145 07/10/16 0500 07/11/16 0500  Weight: 109.4 kg (241 lb 2.9 oz) 109.5 kg (241 lb 4.8 oz) 109.2 kg (240 lb 12.8 oz)    Examination: Constitutional: NAD Vitals:   07/11/16 1420 07/11/16 1430 07/11/16 1500 07/11/16 1509  BP: (!) 121/44 (!) 124/46 (!) 127/43   Pulse: (!) 56 (!) 57 (!) 57   Resp: 18 16 16 12   Temp:  97.5 F (36.4 C) 97.8 F (36.6 C)   TempSrc:   Oral   SpO2: 99% 100% 98% 95%  Weight:      Height:       Respiratory: clear to auscultation bilaterally, no  wheezing, no crackles.  Cardiovascular: Regular rate and rhythm, no murmurs / rubs / gallops. No LE edema. Abdomen: persistent left lower quadrant pain,  Bowel sounds positive.  Musculoskeletal: no clubbing / cyanosis. Neuro: non focal, strength equal     Data Reviewed: I have personally reviewed following labs and imaging studies  CBC:  Recent Labs Lab 07/06/16 0320 07/07/16 0240 07/08/16 0345 07/09/16 0730 07/10/16 0730  WBC 5.3 4.9 6.0 5.6 5.4  HGB 10.6* 10.4* 10.4* 10.3* 10.3*  HCT 32.6* 32.6* 32.1* 32.0* 32.3*  MCV 90.3 88.8 88.4 89.6 89.2  PLT 274 248 234 242 213   Basic Metabolic Panel:  Recent Labs Lab 07/05/16 0215 07/07/16 0240 07/08/16 0345 07/09/16 0730 07/10/16 0730  NA 133* 130* 133* 131* 129*  K 3.9 3.6 3.8 3.6 3.5  CL 93* 95* 95* 93* 93*  CO2 24 23 26 25 26   GLUCOSE 57* 91 92 81 76  BUN 8 16 5* 12 9  CREATININE 4.89* 9.08* 5.18* 7.25* 5.14*  CALCIUM 8.0* 7.6* 7.6* 7.5* 7.5*  PHOS 4.6 5.5* 3.5 4.7*  --    GFR: Estimated Creatinine Clearance: 12.2 mL/min (by C-G formula based on SCr of 5.14 mg/dL (H)). Liver Function Tests:  Recent Labs Lab 07/05/16 0215 07/07/16 0240 07/08/16 0345 07/09/16 0730  ALBUMIN 3.0* 2.8* 2.8* 2.8*   No results for input(s): LIPASE, AMYLASE in the last 168 hours. No results for input(s): AMMONIA in the last 168 hours. Coagulation Profile:  Recent Labs Lab 07/07/16 0240 07/08/16 0345 07/09/16 0334 07/10/16 0730 07/11/16 0810  INR 3.93 3.53 3.31 2.59 1.84   Cardiac Enzymes: No results for input(s): CKTOTAL, CKMB, CKMBINDEX, TROPONINI in the last 168 hours. BNP (last 3 results) No results for input(s): PROBNP in the last 8760 hours. HbA1C: No results for input(s): HGBA1C in the last 72 hours. CBG:  Recent Labs Lab 07/06/16 0730 07/06/16 1113  GLUCAP 96 115*   Lipid Profile: No results for input(s): CHOL, HDL, LDLCALC, TRIG, CHOLHDL, LDLDIRECT in the last 72 hours. Thyroid Function Tests: No  results for input(s): TSH, T4TOTAL, FREET4, T3FREE, THYROIDAB in the last 72 hours. Anemia Panel: No results for input(s): VITAMINB12, FOLATE, FERRITIN, TIBC, IRON, RETICCTPCT in the last 72 hours. Urine analysis:    Component Value Date/Time   COLORURINE AMBER (A) 06/30/2016 0503   APPEARANCEUR HAZY (A) 06/30/2016 0503   LABSPEC 1.020 06/30/2016 0503   PHURINE 5.0 06/30/2016 0503   GLUCOSEU NEGATIVE 06/30/2016 0503   HGBUR NEGATIVE 06/30/2016 0503   BILIRUBINUR SMALL (A) 06/30/2016 0503   KETONESUR NEGATIVE 06/30/2016 0503   PROTEINUR 30 (A) 06/30/2016 0503   UROBILINOGEN 0.2 06/30/2012 2201   NITRITE NEGATIVE 06/30/2016 0503  LEUKOCYTESUR SMALL (A) 06/30/2016 0503   Sepsis Labs: Invalid input(s): PROCALCITONIN, LACTICIDVEN  No results found for this or any previous visit (from the past 240 hour(s)).  Radiology Studies: Ct Abdomen Pelvis W Contrast  Result Date: 07/09/2016 CLINICAL DATA:  Follow-up diverticulitis. Recurrent abdominal pain with advanced diet. History of end-stage renal disease on dialysis. EXAM: CT ABDOMEN AND PELVIS WITH CONTRAST TECHNIQUE: Multidetector CT imaging of the abdomen and pelvis was performed using the standard protocol following bolus administration of intravenous contrast. CONTRAST:  100 cc ISOVUE-300 IOPAMIDOL (ISOVUE-300) INJECTION 61% COMPARISON:  CT abdomen and pelvis July 03, 2016. FINDINGS: LOWER CHEST: Lung bases are clear. The heart is mildly enlarged. No pericardial effusions. HEPATOBILIARY: Mild focal fatty infiltration about the falciform ligament, liver is otherwise unremarkable. Mildly distended gallbladder without pericholecystic inflammation or gallstones. PANCREAS: Normal. SPLEEN: Normal. ADRENALS/URINARY TRACT: Kidneys are orthotopic, demonstrating symmetric enhancement. Symmetrically atrophic kidneys. Multiple bilateral renal cysts measure up to 2.2 cm on the RIGHT. No nephrolithiasis, hydronephrosis or solid renal masses. The  unopacified ureters are normal in course and caliber. Delayed imaging through the kidneys demonstrates symmetric prompt contrast excretion within the proximal urinary collecting system. Urinary bladder is partially distended and unremarkable. Normal adrenal glands. STOMACH/BOWEL: Approximate 12 cm segment of sigmoid acute diverticulitis, with mesenteric mural thickening. Pericolonic fat stranding. Contained micro perforation anti mesenteric soft tissues. No drainable fluid collection. No intraperitoneal free air. No bowel obstruction, contrast to the level of the rectum. Small and large bowel air-fluid levels. The stomach, small bowel are normal in course and caliber without inflammatory changes. VASCULAR/LYMPHATIC: Aortoiliac vessels are normal in course and caliber, moderate atherosclerosis. No lymphadenopathy by CT size criteria. REPRODUCTIVE: LEFT adnexae is mildly enlarged with heterogeneous enhancement. OTHER: No intraperitoneal free fluid or free air. MUSCULOSKELETAL: Nonacute. Moderate sacroiliac osteoarthrosis. Broad dextroscoliosis. Status post L4-5 PLIF, non incorporated interbody disc material with trace lucency about the LEFT L5 pedicle screw. IMPRESSION: Severe sigmoid diverticulitis with similar contained microperforation, no drainable fluid collection. Considering eccentric nature of the mural thickening, recommend follow-up endoscopy after resolution of acute symptoms to exclude mass. Presumed reactive changes of the adjacent LEFT adnexum.  Mild ileus. L4-5 PLIF with mild lucency about the L5 pedicle screw and, non incorporated interbody fusion material concerning for hardware failure. Electronically Signed   By: Elon Alas M.D.   On: 07/09/2016 20:44    Scheduled Meds: . amiodarone  200 mg Oral Daily  . Chlorhexidine Gluconate Cloth  6 each Topical Once  . cinacalcet  30 mg Oral Q supper  . darbepoetin (ARANESP) injection - DIALYSIS  60 mcg Intravenous Q Wed-HD  . docusate sodium   200 mg Oral BID  . heparin  5,000 Units Subcutaneous Q8H  . HYDROmorphone   Intravenous Q4H  . HYDROmorphone      . nystatin  5 mL Oral QID  . piperacillin-tazobactam (ZOSYN)  IV  3.375 g Intravenous Q8H  . sodium chloride flush  3 mL Intravenous Q12H   Continuous Infusions:   Hosie Poisson , MD,  [facility] Pager [phone]  If 7PM-7AM, please contact night-coverage [email or web address] Password [facility] 07/11/2016, 4:56 PM

## 2016-07-12 ENCOUNTER — Encounter (HOSPITAL_COMMUNITY): Payer: Self-pay | Admitting: Surgery

## 2016-07-12 LAB — PROTIME-INR
INR: 1.72
Prothrombin Time: 20.3 seconds — ABNORMAL HIGH (ref 11.4–15.2)

## 2016-07-12 LAB — CBC
HCT: 30.4 % — ABNORMAL LOW (ref 36.0–46.0)
HEMATOCRIT: 30.2 % — AB (ref 36.0–46.0)
HEMOGLOBIN: 9.7 g/dL — AB (ref 12.0–15.0)
Hemoglobin: 9.7 g/dL — ABNORMAL LOW (ref 12.0–15.0)
MCH: 28.2 pg (ref 26.0–34.0)
MCH: 28.2 pg (ref 26.0–34.0)
MCHC: 31.9 g/dL (ref 30.0–36.0)
MCHC: 32.1 g/dL (ref 30.0–36.0)
MCV: 88.4 fL (ref 78.0–100.0)
MCV: 87.8 fL (ref 78.0–100.0)
PLATELETS: 242 10*3/uL (ref 150–400)
Platelets: 244 10*3/uL (ref 150–400)
RBC: 3.44 MIL/uL — ABNORMAL LOW (ref 3.87–5.11)
RBC: 3.44 MIL/uL — ABNORMAL LOW (ref 3.87–5.11)
RDW: 17 % — AB (ref 11.5–15.5)
RDW: 17.1 % — AB (ref 11.5–15.5)
WBC: 8.6 10*3/uL (ref 4.0–10.5)
WBC: 9.8 10*3/uL (ref 4.0–10.5)

## 2016-07-12 LAB — MAGNESIUM: Magnesium: 2 mg/dL (ref 1.7–2.4)

## 2016-07-12 LAB — RENAL FUNCTION PANEL
ANION GAP: 14 (ref 5–15)
Albumin: 2.5 g/dL — ABNORMAL LOW (ref 3.5–5.0)
BUN: 19 mg/dL (ref 6–20)
CHLORIDE: 96 mmol/L — AB (ref 101–111)
CO2: 23 mmol/L (ref 22–32)
Calcium: 7 mg/dL — ABNORMAL LOW (ref 8.9–10.3)
Creatinine, Ser: 8.23 mg/dL — ABNORMAL HIGH (ref 0.44–1.00)
GFR calc Af Amer: 5 mL/min — ABNORMAL LOW (ref >60)
GFR calc non Af Amer: 4 mL/min — ABNORMAL LOW (ref >60)
GLUCOSE: 73 mg/dL (ref 65–99)
POTASSIUM: 4 mmol/L (ref 3.5–5.1)
Phosphorus: 6 mg/dL — ABNORMAL HIGH (ref 2.5–4.6)
Sodium: 133 mmol/L — ABNORMAL LOW (ref 135–145)

## 2016-07-12 LAB — BASIC METABOLIC PANEL
Anion gap: 13 (ref 5–15)
BUN: 17 mg/dL (ref 6–20)
CALCIUM: 7 mg/dL — AB (ref 8.9–10.3)
CHLORIDE: 96 mmol/L — AB (ref 101–111)
CO2: 23 mmol/L (ref 22–32)
CREATININE: 7.94 mg/dL — AB (ref 0.44–1.00)
GFR, EST AFRICAN AMERICAN: 5 mL/min — AB (ref >60)
GFR, EST NON AFRICAN AMERICAN: 4 mL/min — AB (ref >60)
Glucose, Bld: 77 mg/dL (ref 65–99)
Potassium: 3.9 mmol/L (ref 3.5–5.1)
SODIUM: 132 mmol/L — AB (ref 135–145)

## 2016-07-12 MED ORDER — PENTAFLUOROPROP-TETRAFLUOROETH EX AERO
1.0000 | INHALATION_SPRAY | CUTANEOUS | Status: DC | PRN
Start: 2016-07-12 — End: 2016-07-12

## 2016-07-12 MED ORDER — SODIUM CHLORIDE 0.9 % IV SOLN: 100.0000 mL | INTRAVENOUS | Status: DC | PRN

## 2016-07-12 MED ORDER — LIDOCAINE-PRILOCAINE 2.5-2.5 % EX CREA: 1.0000 "application " | TOPICAL_CREAM | CUTANEOUS | Status: DC | PRN

## 2016-07-12 MED ORDER — HEPARIN SODIUM (PORCINE) 1000 UNIT/ML DIALYSIS: 1000.0000 [IU] | INTRAMUSCULAR | Status: DC | PRN

## 2016-07-12 MED ORDER — ALTEPLASE 2 MG IJ SOLR: 2.0000 mg | Freq: Once | INTRAMUSCULAR | Status: DC | PRN

## 2016-07-12 MED ORDER — LIDOCAINE HCL (PF) 1 % IJ SOLN: 5.0000 mL | INTRAMUSCULAR | Status: DC | PRN

## 2016-07-12 MED ORDER — HEPARIN SODIUM (PORCINE) 1000 UNIT/ML DIALYSIS: 20.0000 [IU]/kg | INTRAMUSCULAR | Status: DC | PRN

## 2016-07-12 NOTE — Progress Notes (Signed)
1 Day Post-Op   Subjective: Feels "pretty good". Some incisional pain just when moving. Denies nausea.  Objective: Vital signs in last 24 hours: Temp:  [97 F (36.1 C)-99.2 F (37.3 C)] 98.8 F (37.1 C) (12/23 0600) Pulse Rate:  [56-69] 69 (12/23 0600) Resp:  [10-20] 18 (12/23 0600) BP: (117-142)/(38-47) 121/39 (12/23 0600) SpO2:  [94 %-100 %] 98 % (12/23 0600) Last BM Date: 07/11/16  Intake/Output from previous day: 12/22 0701 - 12/23 0700 In: 1010 [P.O.:60; I.V.:600; IV Piggyback:350] Out: 250 [Blood:250] Intake/Output this shift: No intake/output data recorded.  General appearance: alert, cooperative and no distress Resp: clear to auscultation bilaterally GI: normal findings: soft, non-tender Incision/Wound: VAC in place, no erythema or unusual drainage  Lab Results:   Recent Labs  07/10/16 0730 07/12/16 0450  WBC 5.4 8.6  HGB 10.3* 9.7*  HCT 32.3* 30.4*  PLT 217 242   BMET  Recent Labs  07/11/16 1411 07/12/16 0450  NA 130* 132*  K 3.9 3.9  CL 96* 96*  CO2 24 23  GLUCOSE 75 77  BUN 15 17  CREATININE 6.90* 7.94*  CALCIUM 7.1* 7.0*     Studies/Results: No results found.  Anti-infectives: Anti-infectives    Start     Dose/Rate Route Frequency Ordered Stop   07/11/16 1700  piperacillin-tazobactam (ZOSYN) IVPB 3.375 g     3.375 g 12.5 mL/hr over 240 Minutes Intravenous Every 8 hours 07/11/16 1333 07/12/16 1659   07/11/16 0945  piperacillin-tazobactam (ZOSYN) IVPB 3.375 g     3.375 g 12.5 mL/hr over 240 Minutes Intravenous  Once 07/11/16 0944 07/11/16 1350   07/11/16 0800  ceFAZolin (ANCEF) IVPB 2g/100 mL premix  Status:  Discontinued     2 g 200 mL/hr over 30 Minutes Intravenous To [facility] 07/10/16 1436 07/11/16 1453   07/11/16 0800  metroNIDAZOLE (FLAGYL) IVPB 500 mg  Status:  Discontinued     500 mg 100 mL/hr over 60 Minutes Intravenous To [facility] 07/10/16 1436 07/11/16 1453   07/09/16 2200  piperacillin-tazobactam  (ZOSYN) IVPB 3.375 g  Status:  Discontinued     3.375 g 12.5 mL/hr over 240 Minutes Intravenous Every 12 hours 07/08/16 1704 07/09/16 1242   07/09/16 2000  amoxicillin-clavulanate (AUGMENTIN) 500-125 MG per tablet 500 mg  Status:  Discontinued     1 tablet Oral Daily 07/09/16 1311 07/11/16 1508   07/09/16 1245  amoxicillin-clavulanate (AUGMENTIN) 875-125 MG per tablet 1 tablet  Status:  Discontinued     1 tablet Oral Every 12 hours 07/09/16 1242 07/09/16 1302   07/08/16 2200  amoxicillin-clavulanate (AUGMENTIN) 875-125 MG per tablet 1 tablet  Status:  Discontinued     1 tablet Oral Every 12 hours 07/08/16 1650 07/08/16 1704   07/08/16 2200  amoxicillin-clavulanate (AUGMENTIN) 875-125 MG per tablet 1 tablet     1 tablet Oral  Once 07/08/16 1704 07/08/16 2134   07/03/16 1015  piperacillin-tazobactam (ZOSYN) IVPB 3.375 g  Status:  Discontinued     3.375 g 12.5 mL/hr over 240 Minutes Intravenous Every 12 hours 07/03/16 1001 07/08/16 1704   07/01/16 2200  ciprofloxacin (CIPRO) tablet 500 mg  Status:  Discontinued     500 mg Oral Daily at bedtime 07/01/16 1400 07/03/16 0922   06/26/16 2200  ciprofloxacin (CIPRO) IVPB 400 mg  Status:  Discontinued     400 mg 200 mL/hr over 60 Minutes Intravenous Every 24 hours 06/26/16 0035 07/01/16 2111   06/25/16 2200  ciprofloxacin (CIPRO) tablet 500 mg  Status:  Discontinued     500 mg Oral Every 24 hours 06/25/16 2146 06/26/16 0030   06/25/16 2200  metroNIDAZOLE (FLAGYL) tablet 500 mg     500 mg Oral Every 8 hours 06/25/16 2146 07/02/16 2159      Assessment/Plan: s/p Procedure(s): LAPAROSCOPIC SIGMOID COLECTOMY End-stage renal disease Doing well postoperatively. Start clear liquid diet. Hold anticoagulation today. For dialysis today.   LOS: 16 days    Mei Suits T 12/23/2017[name], female   DOB: 1945-12-30, 70 y.o.   MRN: [id number]

## 2016-07-12 NOTE — Procedures (Signed)
Tol HD Tx Post op stable hemodynamically. Access OK Abd Pain better. Jane [name] C

## 2016-07-12 NOTE — Progress Notes (Signed)
PROGRESS NOTE  Jane [name] PZW:[id number] DOB: 06/21/1946 DOA: 06/25/2016 PCP: [place]   LOS: 16 days   Brief Narrative: 70 y.o.BF PMHx ESRD on HD M/W/F, A-Fib on warfarin, HTN, and anemiaWho presents with chest pain for 1 day and abdominal pain for 3 months. Regarding chest pain, this was a new complaint today. The patient went to HD, and during her session, developed a sharp, severe, constant pain under her right breast associated with shortness of breath and nausea. She finished her treatment, then the HD nurse administered O2 and nitroglycerin, which caused her pain to subside, and recommended she go to the ER.  Patient was initially placed on antibiotics with ciprofloxacin and metronidazole, she was very slow to improve then failed advancement of her diet, general surgery was consulted. Her antibodies were transitioned to Zosyn, and another attempt was made to advance her diet as she was appearing to improve clinically over the weekend. However when she got to soft diet her pain returned. She was put on clear liquid diet and repeat CT abd and pelvis ordered showed possible microperforation. She was taken to OR on 12/22 for lap sigmoid colectomy with colorectal anastomosis and mobilization of splenic flexure.   Assessment & Plan: Principal Problem:   Diverticulitis of large intestine with perforation Active Problems:   ESRD on hemodialysis (HCC)   Paroxysmal atrial fibrillation (HCC)   Essential hypertension   Anemia in other chronic diseases classified elsewhere   Chest pain with moderate risk for cardiac etiology  Diverticulitis with perforation - repeat CT scan 12/20 shows  Severe sigmoid diverticulitis with similar contained microperforation, no drainable fluid collection. Considering eccentric nature of the mural thickening, recommend follow-up endoscopy after resolution of acute symptoms to exclude mass - she was initially on Cipro flagyl but now on Zosyn per  surgery  - though she remains afebrile without any leukocytosis, she has persistent abdominal pain , .  - she underwent lap sigmoid colectomy with colorectal anastomosis and splenic flexure mobilization on 12/22.   Start clears today.  Pain control.   ESRD on HD - Patient clinically continues to be euvolemic. - HD per nephrology, she is on MWF schedule- HD today.    HTN - better controlled.  - continue Coreg   Paroxysmal atrial fibrillation - Currently in NSR - coumadin held for procedure, will resume it after discussing with surgery.   Hyponatremia: probably from ESRD.    Anemia of chronic Renal disease - No signs of bleeding.  Stable around 10.    DVT prophylaxis: Coumadin Code Status: Full Family Communication: no family bedside Disposition Plan: home when ready   Consultants:   General surgery  Cardiology   Nephrology   Procedures:   None   Antimicrobials:  Zosyn 12/14 >>   Subjective: Pain is better. In HD.   Objective: Vitals:   07/12/16 1130 07/12/16 1205 07/12/16 1257 07/12/16 1535  BP: (!) 100/44 (!) 114/50  (!) 123/33  Pulse: 70 74  74  Resp: 19 18 19 18   Temp:  98 F (36.7 C)  98.9 F (37.2 C)  TempSrc:  Oral  Oral  SpO2:  96% 97% 98%  Weight:  112 kg (246 lb 14.6 oz)    Height:        Intake/Output Summary (Last 24 hours) at 07/12/16 1614 Last data filed at 07/12/16 1205  Gross per 24 hour  Intake              160 ml  Output             2000 ml  Net            -1840 ml   Filed Weights   07/11/16 0500 07/12/16 0820 07/12/16 1205  Weight: 109.2 kg (240 lb 12.8 oz) 114 kg (251 lb 5.2 oz) 112 kg (246 lb 14.6 oz)    Examination: Constitutional: NAD Vitals:   07/12/16 1130 07/12/16 1205 07/12/16 1257 07/12/16 1535  BP: (!) 100/44 (!) 114/50  (!) 123/33  Pulse: 70 74  74  Resp: 19 18 19 18   Temp:  98 F (36.7 C)  98.9 F (37.2 C)  TempSrc:  Oral  Oral  SpO2:  96% 97% 98%  Weight:  112 kg (246 lb 14.6 oz)    Height:        Respiratory: clear to auscultation bilaterally, no wheezing, no crackles.  Cardiovascular: Regular rate and rhythm, no murmurs / rubs / gallops. No LE edema. Abdomen: persistent left lower quadrant pain,  Bowel sounds positive.  Musculoskeletal: no clubbing / cyanosis. Neuro: non focal, strength equal     Data Reviewed: I have personally reviewed following labs and imaging studies  CBC:  Recent Labs Lab 07/08/16 0345 07/09/16 0730 07/10/16 0730 07/12/16 0450 07/12/16 0900  WBC 6.0 5.6 5.4 8.6 9.8  HGB 10.4* 10.3* 10.3* 9.7* 9.7*  HCT 32.1* 32.0* 32.3* 30.4* 30.2*  MCV 88.4 89.6 89.2 88.4 87.8  PLT 234 242 217 242 620   Basic Metabolic Panel:  Recent Labs Lab 07/07/16 0240 07/08/16 0345 07/09/16 0730 07/10/16 0730 07/11/16 1411 07/12/16 0450 07/12/16 0900  NA 130* 133* 131* 129* 130* 132* 133*  K 3.6 3.8 3.6 3.5 3.9 3.9 4.0  CL 95* 95* 93* 93* 96* 96* 96*  CO2 23 26 25 26 24 23 23   GLUCOSE 91 92 81 76 75 77 73  BUN 16 5* 12 9 15 17 19   CREATININE 9.08* 5.18* 7.25* 5.14* 6.90* 7.94* 8.23*  CALCIUM 7.6* 7.6* 7.5* 7.5* 7.1* 7.0* 7.0*  MG  --   --   --   --   --  2.0  --   PHOS 5.5* 3.5 4.7*  --   --   --  6.0*   GFR: Estimated Creatinine Clearance: 7.7 mL/min (by C-G formula based on SCr of 8.23 mg/dL (H)). Liver Function Tests:  Recent Labs Lab 07/07/16 0240 07/08/16 0345 07/09/16 0730 07/12/16 0900  ALBUMIN 2.8* 2.8* 2.8* 2.5*   No results for input(s): LIPASE, AMYLASE in the last 168 hours. No results for input(s): AMMONIA in the last 168 hours. Coagulation Profile:  Recent Labs Lab 07/08/16 0345 07/09/16 0334 07/10/16 0730 07/11/16 0810 07/12/16 0450  INR 3.53 3.31 2.59 1.84 1.72   Cardiac Enzymes: No results for input(s): CKTOTAL, CKMB, CKMBINDEX, TROPONINI in the last 168 hours. BNP (last 3 results) No results for input(s): PROBNP in the last 8760 hours. HbA1C: No results for input(s): HGBA1C in the last 72 hours. CBG:  Recent  Labs Lab 07/06/16 0730 07/06/16 1113  GLUCAP 96 115*   Lipid Profile: No results for input(s): CHOL, HDL, LDLCALC, TRIG, CHOLHDL, LDLDIRECT in the last 72 hours. Thyroid Function Tests: No results for input(s): TSH, T4TOTAL, FREET4, T3FREE, THYROIDAB in the last 72 hours. Anemia Panel: No results for input(s): VITAMINB12, FOLATE, FERRITIN, TIBC, IRON, RETICCTPCT in the last 72 hours. Urine analysis:    Component Value Date/Time   COLORURINE AMBER (A) 06/30/2016 0503   APPEARANCEUR HAZY (A)  06/30/2016 0503   LABSPEC 1.020 06/30/2016 0503   PHURINE 5.0 06/30/2016 0503   GLUCOSEU NEGATIVE 06/30/2016 0503   HGBUR NEGATIVE 06/30/2016 0503   BILIRUBINUR SMALL (A) 06/30/2016 0503   KETONESUR NEGATIVE 06/30/2016 0503   PROTEINUR 30 (A) 06/30/2016 0503   UROBILINOGEN 0.2 06/30/2012 2201   NITRITE NEGATIVE 06/30/2016 0503   LEUKOCYTESUR SMALL (A) 06/30/2016 0503   Sepsis Labs: Invalid input(s): PROCALCITONIN, LACTICIDVEN  No results found for this or any previous visit (from the past 240 hour(s)).  Radiology Studies: No results found.  Scheduled Meds: . amiodarone  200 mg Oral Daily  . Chlorhexidine Gluconate Cloth  6 each Topical Once  . cinacalcet  30 mg Oral Q supper  . darbepoetin (ARANESP) injection - DIALYSIS  60 mcg Intravenous Q Wed-HD  . docusate sodium  200 mg Oral BID  . heparin  5,000 Units Subcutaneous Q8H  . HYDROmorphone   Intravenous Q4H  . nystatin  5 mL Oral QID  . piperacillin-tazobactam (ZOSYN)  IV  3.375 g Intravenous Q8H  . sodium chloride flush  3 mL Intravenous Q12H   Continuous Infusions:   Hosie Poisson , MD,  [facility] Pager [phone]  If 7PM-7AM, please contact night-coverage [email or web address] Password [facility] 07/12/2016, 4:14 PM

## 2016-07-12 NOTE — Progress Notes (Signed)
Cardiology to see as needed.  Resume anticoagulation when able. Please call with questions  Thompson Grayer MD, [facility] 07/12/2016 12:28 PM

## 2016-07-13 LAB — PROTIME-INR
INR: 1.88
PROTHROMBIN TIME: 21.9 s — AB (ref 11.4–15.2)

## 2016-07-13 LAB — CBC
HCT: 29.4 % — ABNORMAL LOW (ref 36.0–46.0)
Hemoglobin: 9.5 g/dL — ABNORMAL LOW (ref 12.0–15.0)
MCH: 28.4 pg (ref 26.0–34.0)
MCHC: 32.3 g/dL (ref 30.0–36.0)
MCV: 88 fL (ref 78.0–100.0)
PLATELETS: 254 10*3/uL (ref 150–400)
RBC: 3.34 MIL/uL — ABNORMAL LOW (ref 3.87–5.11)
RDW: 17.3 % — ABNORMAL HIGH (ref 11.5–15.5)
WBC: 11.6 10*3/uL — ABNORMAL HIGH (ref 4.0–10.5)

## 2016-07-13 LAB — RENAL FUNCTION PANEL
Albumin: 2.6 g/dL — ABNORMAL LOW (ref 3.5–5.0)
Anion gap: 11 (ref 5–15)
BUN: 13 mg/dL (ref 6–20)
CALCIUM: 7.5 mg/dL — AB (ref 8.9–10.3)
CO2: 24 mmol/L (ref 22–32)
CREATININE: 6.32 mg/dL — AB (ref 0.44–1.00)
Chloride: 96 mmol/L — ABNORMAL LOW (ref 101–111)
GFR calc Af Amer: 7 mL/min — ABNORMAL LOW (ref >60)
GFR calc non Af Amer: 6 mL/min — ABNORMAL LOW (ref >60)
Glucose, Bld: 103 mg/dL — ABNORMAL HIGH (ref 65–99)
Phosphorus: 4.1 mg/dL (ref 2.5–4.6)
Potassium: 3.9 mmol/L (ref 3.5–5.1)
SODIUM: 131 mmol/L — AB (ref 135–145)

## 2016-07-13 MED ORDER — ONDANSETRON HCL 4 MG/2ML IJ SOLN
INTRAMUSCULAR | Status: AC
  Filled 2016-07-13: qty 2

## 2016-07-13 MED ORDER — WARFARIN SODIUM 5 MG PO TABS
5.0000 mg | ORAL_TABLET | Freq: Once | ORAL | Status: AC
  Administered 2016-07-13: 5 mg via ORAL
  Filled 2016-07-13: qty 1

## 2016-07-13 MED ORDER — PROMETHAZINE HCL 25 MG/ML IJ SOLN
12.5000 mg | INTRAMUSCULAR | Status: DC | PRN
  Administered 2016-07-13 – 2016-07-14 (×3): 12.5 mg via INTRAVENOUS
  Filled 2016-07-13 (×4): qty 1

## 2016-07-13 MED ORDER — HYDROMORPHONE HCL 2 MG/ML IJ SOLN: 0.5000 mg | INTRAMUSCULAR | Status: DC | PRN

## 2016-07-13 MED ORDER — WARFARIN - PHARMACIST DOSING INPATIENT
Freq: Every day | Status: DC
  Administered 2016-07-14 – 2016-07-15 (×2)

## 2016-07-13 NOTE — Progress Notes (Signed)
PT Cancellation Note  Patient Details Name: Jane [name] MRN: [id number] DOB: Feb 21, 1946   Cancelled Treatment:    Reason Eval/Treat Not Completed: Other (comment)  Pt refused PT this AM due to nausea. States she has been up to [facility] but has not tried to ambulate yet. Will try to gt back to see pt later today to attempt PT eval.   Melvern Banker 07/13/2016, 8:38 AM  Lavonia Dana, PT  [phone] 07/13/2016

## 2016-07-13 NOTE — Progress Notes (Signed)
PROGRESS NOTE  Jane [name] KWI:[id number] DOB: 12-11-1945 DOA: 06/25/2016 PCP: [place]   LOS: 17 days   Brief Narrative: 70 y.o.BF PMHx ESRD on HD M/W/F, A-Fib on warfarin, HTN, and anemiaWho presents with chest pain for 1 day and abdominal pain for 3 months. Regarding chest pain, this was a new complaint today. The patient went to HD, and during her session, developed a sharp, severe, constant pain under her right breast associated with shortness of breath and nausea. She finished her treatment, then the HD nurse administered O2 and nitroglycerin, which caused her pain to subside, and recommended she go to the ER.  Patient was initially placed on antibiotics with ciprofloxacin and metronidazole, she was very slow to improve then failed advancement of her diet, general surgery was consulted. Her antibodies were transitioned to Zosyn, and another attempt was made to advance her diet as she was appearing to improve clinically over the weekend. However when she got to soft diet her pain returned. She was put on clear liquid diet and repeat CT abd and pelvis ordered showed possible microperforation. She was taken to OR on 12/22 for lap sigmoid colectomy with colorectal anastomosis and mobilization of splenic flexure.   Assessment & Plan: Principal Problem:   Diverticulitis of large intestine with perforation Active Problems:   ESRD on hemodialysis (HCC)   Paroxysmal atrial fibrillation (HCC)   Essential hypertension   Anemia in other chronic diseases classified elsewhere   Chest pain with moderate risk for cardiac etiology  Diverticulitis with perforation - repeat CT scan 12/20 shows  Severe sigmoid diverticulitis with similar contained microperforation, no drainable fluid collection. Considering eccentric nature of the mural thickening, recommend follow-up endoscopy after resolution of acute symptoms to exclude mass - she was initially on Cipro flagyl but now on Zosyn per  surgery  - though she remains afebrile without any leukocytosis, she has persistent abdominal pain , .  - she underwent lap sigmoid colectomy with colorectal anastomosis and splenic flexure mobilization on 12/22.   Started clears, continues to have nausea and 2 episodes of vomiting today. Phenergan added.  Pain control.   ESRD on HD - Patient clinically continues to be euvolemic. - HD per nephrology, she is on MWF schedule-   HTN - better controlled.  - continue Coreg   Paroxysmal atrial fibrillation - Currently in NSR - coumadin held for procedure, will resume it after discussing with surgery.   Hyponatremia: probably from ESRD.    Anemia of chronic Renal disease - No signs of bleeding.  Stable around 10.    DVT prophylaxis: Coumadin on hold.  Code Status: Full Family Communication: no family bedside Disposition Plan: home when ready   Consultants:   General surgery  Cardiology   Nephrology   Procedures:   None   Antimicrobials:  Zosyn 12/14 >>   Subjective: Nauseated.   Objective: Vitals:   07/13/16 1200 07/13/16 1230 07/13/16 1301 07/13/16 1315  BP: (!) 165/72 (!) 141/53 (!) 138/52 (!) 140/40  Pulse: 84 80 73 80  Resp: 16 17 18 16   Temp:   98.7 F (37.1 C) 98.4 F (36.9 C)  TempSrc:   Oral Oral  SpO2:   95% 97%  Weight:      Height:        Intake/Output Summary (Last 24 hours) at 07/13/16 1852 Last data filed at 07/13/16 1635  Gross per 24 hour  Intake  120 ml  Output             1001 ml  Net             -881 ml   Filed Weights   07/12/16 0820 07/12/16 1205 07/13/16 0910  Weight: 114 kg (251 lb 5.2 oz) 112 kg (246 lb 14.6 oz) 107.1 kg (236 lb 1.8 oz)    Examination: Constitutional: NAD Vitals:   07/13/16 1200 07/13/16 1230 07/13/16 1301 07/13/16 1315  BP: (!) 165/72 (!) 141/53 (!) 138/52 (!) 140/40  Pulse: 84 80 73 80  Resp: 16 17 18 16   Temp:   98.7 F (37.1 C) 98.4 F (36.9 C)  TempSrc:   Oral Oral  SpO2:    95% 97%  Weight:      Height:       Respiratory: clear to auscultation bilaterally, no wheezing, no crackles.  Cardiovascular: Regular rate and rhythm, no murmurs / rubs / gallops. No LE edema. Abdomen: persistent left lower quadrant pain,  Bowel sounds positive.  Musculoskeletal: no clubbing / cyanosis. Neuro: non focal, strength equal     Data Reviewed: I have personally reviewed following labs and imaging studies  CBC:  Recent Labs Lab 07/09/16 0730 07/10/16 0730 07/12/16 0450 07/12/16 0900 07/13/16 0900  WBC 5.6 5.4 8.6 9.8 11.6*  HGB 10.3* 10.3* 9.7* 9.7* 9.5*  HCT 32.0* 32.3* 30.4* 30.2* 29.4*  MCV 89.6 89.2 88.4 87.8 88.0  PLT 242 217 242 244 170   Basic Metabolic Panel:  Recent Labs Lab 07/07/16 0240 07/08/16 0345 07/09/16 0730 07/10/16 0730 07/11/16 1411 07/12/16 0450 07/12/16 0900 07/13/16 0900  NA 130* 133* 131* 129* 130* 132* 133* 131*  K 3.6 3.8 3.6 3.5 3.9 3.9 4.0 3.9  CL 95* 95* 93* 93* 96* 96* 96* 96*  CO2 23 26 25 26 24 23 23 24   GLUCOSE 91 92 81 76 75 77 73 103*  BUN 16 5* 12 9 15 17 19 13   CREATININE 9.08* 5.18* 7.25* 5.14* 6.90* 7.94* 8.23* 6.32*  CALCIUM 7.6* 7.6* 7.5* 7.5* 7.1* 7.0* 7.0* 7.5*  MG  --   --   --   --   --  2.0  --   --   PHOS 5.5* 3.5 4.7*  --   --   --  6.0* 4.1   GFR: Estimated Creatinine Clearance: 9.8 mL/min (by C-G formula based on SCr of 6.32 mg/dL (H)). Liver Function Tests:  Recent Labs Lab 07/07/16 0240 07/08/16 0345 07/09/16 0730 07/12/16 0900 07/13/16 0900  ALBUMIN 2.8* 2.8* 2.8* 2.5* 2.6*   No results for input(s): LIPASE, AMYLASE in the last 168 hours. No results for input(s): AMMONIA in the last 168 hours. Coagulation Profile:  Recent Labs Lab 07/09/16 0334 07/10/16 0730 07/11/16 0810 07/12/16 0450 07/13/16 0432  INR 3.31 2.59 1.84 1.72 1.88   Cardiac Enzymes: No results for input(s): CKTOTAL, CKMB, CKMBINDEX, TROPONINI in the last 168 hours. BNP (last 3 results) No results for  input(s): PROBNP in the last 8760 hours. HbA1C: No results for input(s): HGBA1C in the last 72 hours. CBG: No results for input(s): GLUCAP in the last 168 hours. Lipid Profile: No results for input(s): CHOL, HDL, LDLCALC, TRIG, CHOLHDL, LDLDIRECT in the last 72 hours. Thyroid Function Tests: No results for input(s): TSH, T4TOTAL, FREET4, T3FREE, THYROIDAB in the last 72 hours. Anemia Panel: No results for input(s): VITAMINB12, FOLATE, FERRITIN, TIBC, IRON, RETICCTPCT in the last 72 hours. Urine analysis:    Component Value  Date/Time   COLORURINE AMBER (A) 06/30/2016 0503   APPEARANCEUR HAZY (A) 06/30/2016 0503   LABSPEC 1.020 06/30/2016 0503   PHURINE 5.0 06/30/2016 0503   GLUCOSEU NEGATIVE 06/30/2016 0503   HGBUR NEGATIVE 06/30/2016 0503   BILIRUBINUR SMALL (A) 06/30/2016 0503   KETONESUR NEGATIVE 06/30/2016 0503   PROTEINUR 30 (A) 06/30/2016 0503   UROBILINOGEN 0.2 06/30/2012 2201   NITRITE NEGATIVE 06/30/2016 0503   LEUKOCYTESUR SMALL (A) 06/30/2016 0503   Sepsis Labs: Invalid input(s): PROCALCITONIN, LACTICIDVEN  No results found for this or any previous visit (from the past 240 hour(s)).  Radiology Studies: No results found.  Scheduled Meds: . amiodarone  200 mg Oral Daily  . Chlorhexidine Gluconate Cloth  6 each Topical Once  . cinacalcet  30 mg Oral Q supper  . darbepoetin (ARANESP) injection - DIALYSIS  60 mcg Intravenous Q Wed-HD  . docusate sodium  200 mg Oral BID  . heparin  5,000 Units Subcutaneous Q8H  . nystatin  5 mL Oral QID  . sodium chloride flush  3 mL Intravenous Q12H  . Warfarin - Pharmacist Dosing Inpatient   Does not apply q1800   Continuous Infusions:   Hosie Poisson , MD,  [facility] Pager [phone]  If 7PM-7AM, please contact night-coverage [email or web address] Password [facility] 07/13/2016, 6:52 PM

## 2016-07-13 NOTE — Progress Notes (Addendum)
ANTICOAGULATION CONSULT NOTE - Follow Up Consult  Pharmacy Consult for Coumadin Indication: atrial fibrillation  Allergies  Allergen Reactions  . Tape Other (See Comments)    PLASTIC TAPE TEARS OFF THE SKIN AND BRUISES IT TERRIBLY!!  . Latex Rash    Patient Measurements: Height: 5\' 5"  (165.1 cm) Weight: 236 lb 1.8 oz (107.1 kg) IBW/kg (Calculated) : 57 Vital Signs: Temp: 98.4 F (36.9 C) (12/24 0910) Temp Source: Oral (12/24 0910) BP: 165/58 (12/24 1030) Pulse Rate: 79 (12/24 1030)  Labs:  Recent Labs  07/11/16 0810  07/12/16 0450 07/12/16 0900 07/13/16 0432 07/13/16 0900  HGB  --   < > 9.7* 9.7*  --  9.5*  HCT  --   --  30.4* 30.2*  --  29.4*  PLT  --   --  242 244  --  254  LABPROT 21.5*  --  20.3*  --  21.9*  --   INR 1.84  --  1.72  --  1.88  --   CREATININE  --   < > 7.94* 8.23*  --  6.32*  < > = values in this interval not displayed.  Estimated Creatinine Clearance: 9.8 mL/min (by C-G formula based on SCr of 6.32 mg/dL (H)).  Assessment: 70 year old female with history of atrial fibrillation on warfarin prior to admission. INR was SUPRA-therapeutic on admission, warfarin was resumed but INR became elevated due to interaction with antibiotics. Warfarin was held, and then vitamin K 5mg  was given on 12/21 in preparation for OR on 12/22. Now to resume warfarin per surgery.  INR today 1.88. Last dose of warfarin given 12/16. Hgb 9.5, plts 254. No bleeding noted. Patient is on Aranesp (no iron) with next dose due Wednesday 12/27.  Goal of Therapy:  INR 2-3 Monitor platelets by anticoagulation protocol: Yes   Plan:  -warfarin 5mg  po x1 tonight -daily PT/INR -follow for s/s bleeding -ok to continue subq heparin until INR therapeutic  Martice Doty D. Raygan Skarda, PharmD, BCPS Clinical Pharmacist Pager: [phone] 07/13/2016 10:43 AM

## 2016-07-13 NOTE — Evaluation (Signed)
Physical Therapy Evaluation Patient Details Name: Jane [name] MRN: [id number] DOB: Jul 30, 1945 Today's Date: 07/13/2016   History of Present Illness  Admitted 06/25/16 and underwent LAPAROSCOPIC SIGMOID COLECTOMY on 07/11/16.  PMH significant for : ESRD, HTN, A-fib.     Clinical Impression  Pt admitted with above diagnosis. Pt currently with functional limitations due to the deficits listed below (see PT Problem List). Pt will benefit from skilled PT to increase their independence and safety with mobility to allow discharge to home with family to assist. Pt reports she is much weaker than PTA.  Recommend HHPT f/u at DC and for pt to ambulate with nursing and RW at least daily.     Follow Up Recommendations Home health PT;Supervision for mobility/OOB    Equipment Recommendations  Rolling walker with 5" wheels    Recommendations for Other Services       Precautions / Restrictions Precautions Precautions: Fall Restrictions Weight Bearing Restrictions: No      Mobility  Bed Mobility Overal bed mobility:  (Pt was sitting EOB when PT arrived)                Transfers Overall transfer level: Needs assistance Equipment used: None Transfers: Sit to/from Stand Sit to Stand: Min guard            Ambulation/Gait Ambulation/Gait assistance: Min guard Ambulation Distance (Feet): 50 Feet Assistive device: Rolling walker (2 wheeled) Gait Pattern/deviations: Step-through pattern;Decreased stride length   Gait velocity interpretation: at or above normal speed for age/gender General Gait Details: Pt stopeed once to vommit small amount  Stairs            Wheelchair Mobility    Modified Rankin (Stroke Patients Only)       Balance                                             Pertinent Vitals/Pain Pain Assessment: No/denies pain    Home Living Family/patient expects to be discharged to:: Private residence Living Arrangements: Children (Son and  daughter in [employer]) Available Help at Discharge: Family;Available PRN/intermittently Type of Home: House Home Access: Stairs to enter Entrance Stairs-Rails: None Entrance Stairs-Number of Steps: 2 Home Layout: One level        Prior Function Level of Independence: Independent         Comments: Performed basic ADLs, but family did cooking and cleaning  Pt did not use assistive device for gait     Hand Dominance        Extremity/Trunk Assessment   Upper Extremity Assessment Upper Extremity Assessment: Overall WFL for tasks assessed    Lower Extremity Assessment Lower Extremity Assessment: Overall WFL for tasks assessed    Cervical / Trunk Assessment Cervical / Trunk Assessment: Normal  Communication   Communication: No difficulties  Cognition Arousal/Alertness: Awake/alert Behavior During Therapy: WFL for tasks assessed/performed Overall Cognitive Status: Within Functional Limits for tasks assessed                      General Comments General comments (skin integrity, edema, etc.): No family present.  Pt used bathroom just prior to PT arrival and had just completed HD.  Pt wanted to sit OOB however no chair alarms available. Pt educated several times to only get up with assistance. RN aware.      Exercises     Assessment/Plan  PT Assessment Patient needs continued PT services  PT Problem List Decreased activity tolerance;Decreased balance;Decreased mobility;Decreased knowledge of use of DME          PT Treatment Interventions DME instruction;Gait training;Therapeutic exercise;Therapeutic activities;Balance training;Patient/family education    PT Goals (Current goals can be found in the Care Plan section)  Acute Rehab PT Goals Patient Stated Goal: to go home PT Goal Formulation: With patient Time For Goal Achievement: 07/20/16 Potential to Achieve Goals: Good    Frequency Min 3X/week   Barriers to discharge        Co-evaluation                End of Session Equipment Utilized During Treatment: Gait belt Activity Tolerance: Patient limited by fatigue Patient left: in chair;with call bell/phone within reach;Other (comment) (RN and NT aware that no chair alarm available and pt wanted to sit up to help with nausea ) Nurse Communication: Mobility status;Other (comment) (no chair alarm available.  )         Time: 5015-8682 PT Time Calculation (min) (ACUTE ONLY): 17 min   Charges:   PT Evaluation $PT Eval Low Complexity: 1 Procedure     PT G CodesMelvern Banker 07/13/2016, 2:56 PM  Lavonia Dana, [place] 07/13/2016

## 2016-07-13 NOTE — Progress Notes (Addendum)
PT Cancellation Note  Patient Details Name: Jane [name] MRN: [id number] DOB: [birth year]   Cancelled Treatment:    Reason Eval/Treat Not Completed: Patient at procedure or test/unavailable (Pt now at HD)    Recommend pt attempt room ambulation with nursing staff.     Melvern Banker 07/13/2016, 12:26 PM  Lavonia Dana, [place] 07/13/2016

## 2016-07-13 NOTE — Progress Notes (Signed)
2 Days Post-Op   Subjective: Nausea is only complaint. Has vomited once or twice. Had small bowel movement and flatus. Minimal pain.  Objective: Vital signs in last 24 hours: Temp:  [98 F (36.7 C)-99.1 F (37.3 C)] 98 F (36.7 C) (12/24 0455) Pulse Rate:  [66-75] 75 (12/24 0455) Resp:  [16-24] 16 (12/24 0746) BP: (85-157)/(30-51) 157/43 (12/24 0455) SpO2:  [95 %-100 %] 95 % (12/24 0746) Weight:  [112 kg (246 lb 14.6 oz)] 112 kg (246 lb 14.6 oz) (12/23 1205) Last BM Date: 07/11/16  Intake/Output from previous day: 12/23 0701 - 12/24 0700 In: 290 [P.O.:290] Out: 2000  Intake/Output this shift: No intake/output data recorded.  General appearance: alert, cooperative and no distress Resp: No wheezing or increased work of breathing GI: Few bowel sounds. Nondistended. Mild appropriate tenderness. Incision/Wound: VAC in place, no erythema or unusual drainage  Lab Results:   Recent Labs  07/12/16 0450 07/12/16 0900  WBC 8.6 9.8  HGB 9.7* 9.7*  HCT 30.4* 30.2*  PLT 242 244   BMET  Recent Labs  07/12/16 0450 07/12/16 0900  NA 132* 133*  K 3.9 4.0  CL 96* 96*  CO2 23 23  GLUCOSE 77 73  BUN 17 19  CREATININE 7.94* 8.23*  CALCIUM 7.0* 7.0*     Studies/Results: No results found.  Anti-infectives: Anti-infectives    Start     Dose/Rate Route Frequency Ordered Stop   07/11/16 1700  piperacillin-tazobactam (ZOSYN) IVPB 3.375 g     3.375 g 12.5 mL/hr over 240 Minutes Intravenous Every 8 hours 07/11/16 1333 07/12/16 1701   07/11/16 0945  piperacillin-tazobactam (ZOSYN) IVPB 3.375 g     3.375 g 12.5 mL/hr over 240 Minutes Intravenous  Once 07/11/16 0944 07/11/16 1350   07/11/16 0800  ceFAZolin (ANCEF) IVPB 2g/100 mL premix  Status:  Discontinued     2 g 200 mL/hr over 30 Minutes Intravenous To ShortStay Surgical 07/10/16 1436 07/11/16 1453   07/11/16 0800  metroNIDAZOLE (FLAGYL) IVPB 500 mg  Status:  Discontinued     500 mg 100 mL/hr over 60 Minutes Intravenous  To ShortStay Surgical 07/10/16 1436 07/11/16 1453   07/09/16 2200  piperacillin-tazobactam (ZOSYN) IVPB 3.375 g  Status:  Discontinued     3.375 g 12.5 mL/hr over 240 Minutes Intravenous Every 12 hours 07/08/16 1704 07/09/16 1242   07/09/16 2000  amoxicillin-clavulanate (AUGMENTIN) 500-125 MG per tablet 500 mg  Status:  Discontinued     1 tablet Oral Daily 07/09/16 1311 07/11/16 1508   07/09/16 1245  amoxicillin-clavulanate (AUGMENTIN) 875-125 MG per tablet 1 tablet  Status:  Discontinued     1 tablet Oral Every 12 hours 07/09/16 1242 07/09/16 1302   07/08/16 2200  amoxicillin-clavulanate (AUGMENTIN) 875-125 MG per tablet 1 tablet  Status:  Discontinued     1 tablet Oral Every 12 hours 07/08/16 1650 07/08/16 1704   07/08/16 2200  amoxicillin-clavulanate (AUGMENTIN) 875-125 MG per tablet 1 tablet     1 tablet Oral  Once 07/08/16 1704 07/08/16 2134   07/03/16 1015  piperacillin-tazobactam (ZOSYN) IVPB 3.375 g  Status:  Discontinued     3.375 g 12.5 mL/hr over 240 Minutes Intravenous Every 12 hours 07/03/16 1001 07/08/16 1704   07/01/16 2200  ciprofloxacin (CIPRO) tablet 500 mg  Status:  Discontinued     500 mg Oral Daily at bedtime 07/01/16 1400 07/03/16 0922   06/26/16 2200  ciprofloxacin (CIPRO) IVPB 400 mg  Status:  Discontinued     400 mg  200 mL/hr over 60 Minutes Intravenous Every 24 hours 06/26/16 0035 07/01/16 2111   06/25/16 2200  ciprofloxacin (CIPRO) tablet 500 mg  Status:  Discontinued     500 mg Oral Every 24 hours 06/25/16 2146 06/26/16 0030   06/25/16 2200  metroNIDAZOLE (FLAGYL) tablet 500 mg     500 mg Oral Every 8 hours 06/25/16 2146 07/02/16 2159      Assessment/Plan: s/p Procedure(s): LAPAROSCOPIC SIGMOID COLECTOMY Stable postoperatively. Likely some degree of ileus. Stay clear liquids as tolerated today.   LOS: 17 days    Vienne Corcoran T 07/13/2016

## 2016-07-14 ENCOUNTER — Inpatient Hospital Stay (HOSPITAL_COMMUNITY): Payer: Medicare Other

## 2016-07-14 LAB — PROTIME-INR
INR: 1.89
PROTHROMBIN TIME: 22 s — AB (ref 11.4–15.2)

## 2016-07-14 MED ORDER — CARVEDILOL 25 MG PO TABS
50.0000 mg | ORAL_TABLET | Freq: Two times a day (BID) | ORAL | Status: DC
  Administered 2016-07-14 – 2016-07-18 (×9): 50 mg via ORAL
  Filled 2016-07-14 (×10): qty 2

## 2016-07-14 MED ORDER — WHITE PETROLATUM GEL
Status: AC
  Administered 2016-07-14: 18:00:00
  Filled 2016-07-14: qty 1

## 2016-07-14 MED ORDER — WARFARIN SODIUM 5 MG PO TABS
5.0000 mg | ORAL_TABLET | Freq: Once | ORAL | Status: AC
  Administered 2016-07-14: 5 mg via ORAL
  Filled 2016-07-14: qty 1

## 2016-07-14 NOTE — Progress Notes (Signed)
PROGRESS NOTE  Jane [name] UQJ:[id number] DOB: 09/02/1945 DOA: 06/25/2016 PCP: [facility]   LOS: 18 days   Brief Narrative: 70 y.o.BF PMHx ESRD on HD M/W/F, A-Fib on warfarin, HTN, and anemiaWho presents with chest pain for 1 day and abdominal pain for 3 months. Regarding chest pain, this was a new complaint today. The patient went to HD, and during her session, developed a sharp, severe, constant pain under her right breast associated with shortness of breath and nausea. She finished her treatment, then the HD nurse administered O2 and nitroglycerin, which caused her pain to subside, and recommended she go to the ER.  Patient was initially placed on antibiotics with ciprofloxacin and metronidazole, she was very slow to improve then failed advancement of her diet, general surgery was consulted. Her antibodies were transitioned to Zosyn, and another attempt was made to advance her diet as she was appearing to improve clinically over the weekend. However when she got to soft diet her pain returned. She was put on clear liquid diet and repeat CT abd and pelvis ordered showed possible microperforation. She was taken to OR on 12/22 for lap sigmoid colectomy with colorectal anastomosis and mobilization of splenic flexure.   Assessment & Plan: Principal Problem:   Diverticulitis of large intestine with perforation Active Problems:   ESRD on hemodialysis (HCC)   Paroxysmal atrial fibrillation (HCC)   Essential hypertension   Anemia in other chronic diseases classified elsewhere   Chest pain with moderate risk for cardiac etiology  Diverticulitis with perforation - repeat CT scan 12/20 shows  Severe sigmoid diverticulitis with similar contained microperforation, no drainable fluid collection. Considering eccentric nature of the mural thickening, recommend follow-up endoscopy after resolution of acute symptoms to exclude mass - she was initially on Cipro flagyl but now on Zosyn per  surgery  - though she remains afebrile without any leukocytosis, she has persistent abdominal pain , .  - she underwent lap sigmoid colectomy with colorectal anastomosis and splenic flexure mobilization on 12/22.   Started clears, continues to have nausea and 2 episodes of vomiting today. abd x ray shows dilated bowel loops, possibly post ileus vs SBO.  Recommended ambulate in the halls. ? NG tube.   Phenergan added.  Pain control.   ESRD on HD - Patient clinically continues to be euvolemic. - HD per nephrology, she is on MWF schedule-   HTN - better controlled.  - continue Coreg   Paroxysmal atrial fibrillation - Currently in NSR - coumadin restarted.    Hyponatremia: probably from ESRD.    Anemia of chronic Renal disease - No signs of bleeding.  Stable around 10.    DVT prophylaxis: Coumadin . Code Status: Full Family Communication: no family bedside Disposition Plan: home when ready   Consultants:   General surgery  Cardiology   Nephrology   Procedures:   None   Antimicrobials:  Zosyn 12/14 >>   Subjective: Nauseated. 2 episodes of vomiting.   Objective: Vitals:   07/13/16 2105 07/14/16 0441 07/14/16 0509 07/14/16 1329  BP: (!) 150/48 (!) 173/54 (!) 170/58 (!) 149/44  Pulse:  78  67  Resp:  18  18  Temp:  97.9 F (36.6 C)  98.7 F (37.1 C)  TempSrc:  Oral  Oral  SpO2:  93%  98%  Weight:      Height:        Intake/Output Summary (Last 24 hours) at 07/14/16 1829 Last data filed at 07/14/16 1330  Gross per  24 hour  Intake              243 ml  Output              200 ml  Net               43 ml   Filed Weights   07/12/16 0820 07/12/16 1205 07/13/16 0910  Weight: 114 kg (251 lb 5.2 oz) 112 kg (246 lb 14.6 oz) 107.1 kg (236 lb 1.8 oz)    Examination: Constitutional: NAD Vitals:   07/13/16 2105 07/14/16 0441 07/14/16 0509 07/14/16 1329  BP: (!) 150/48 (!) 173/54 (!) 170/58 (!) 149/44  Pulse:  78  67  Resp:  18  18  Temp:  97.9 F  (36.6 C)  98.7 F (37.1 C)  TempSrc:  Oral  Oral  SpO2:  93%  98%  Weight:      Height:       Respiratory: clear to auscultation bilaterally, no wheezing, no crackles.  Cardiovascular: Regular rate and rhythm, no murmurs / rubs / gallops. No LE edema. Abdomen: persistent left lower quadrant pain,  Bowel sounds positive.  Musculoskeletal: no clubbing / cyanosis. Neuro: non focal, strength equal     Data Reviewed: I have personally reviewed following labs and imaging studies  CBC:  Recent Labs Lab 07/09/16 0730 07/10/16 0730 07/12/16 0450 07/12/16 0900 07/13/16 0900  WBC 5.6 5.4 8.6 9.8 11.6*  HGB 10.3* 10.3* 9.7* 9.7* 9.5*  HCT 32.0* 32.3* 30.4* 30.2* 29.4*  MCV 89.6 89.2 88.4 87.8 88.0  PLT 242 217 242 244 364   Basic Metabolic Panel:  Recent Labs Lab 07/08/16 0345 07/09/16 0730 07/10/16 0730 07/11/16 1411 07/12/16 0450 07/12/16 0900 07/13/16 0900  NA 133* 131* 129* 130* 132* 133* 131*  K 3.8 3.6 3.5 3.9 3.9 4.0 3.9  CL 95* 93* 93* 96* 96* 96* 96*  CO2 26 25 26 24 23 23 24   GLUCOSE 92 81 76 75 77 73 103*  BUN 5* 12 9 15 17 19 13   CREATININE 5.18* 7.25* 5.14* 6.90* 7.94* 8.23* 6.32*  CALCIUM 7.6* 7.5* 7.5* 7.1* 7.0* 7.0* 7.5*  MG  --   --   --   --  2.0  --   --   PHOS 3.5 4.7*  --   --   --  6.0* 4.1   GFR: Estimated Creatinine Clearance: 9.8 mL/min (by C-G formula based on SCr of 6.32 mg/dL (H)). Liver Function Tests:  Recent Labs Lab 07/08/16 0345 07/09/16 0730 07/12/16 0900 07/13/16 0900  ALBUMIN 2.8* 2.8* 2.5* 2.6*   No results for input(s): LIPASE, AMYLASE in the last 168 hours. No results for input(s): AMMONIA in the last 168 hours. Coagulation Profile:  Recent Labs Lab 07/10/16 0730 07/11/16 0810 07/12/16 0450 07/13/16 0432 07/14/16 0401  INR 2.59 1.84 1.72 1.88 1.89   Cardiac Enzymes: No results for input(s): CKTOTAL, CKMB, CKMBINDEX, TROPONINI in the last 168 hours. BNP (last 3 results) No results for input(s): PROBNP in the  last 8760 hours. HbA1C: No results for input(s): HGBA1C in the last 72 hours. CBG: No results for input(s): GLUCAP in the last 168 hours. Lipid Profile: No results for input(s): CHOL, HDL, LDLCALC, TRIG, CHOLHDL, LDLDIRECT in the last 72 hours. Thyroid Function Tests: No results for input(s): TSH, T4TOTAL, FREET4, T3FREE, THYROIDAB in the last 72 hours. Anemia Panel: No results for input(s): VITAMINB12, FOLATE, FERRITIN, TIBC, IRON, RETICCTPCT in the last 72 hours. Urine analysis:  Component Value Date/Time   COLORURINE AMBER (A) 06/30/2016 0503   APPEARANCEUR HAZY (A) 06/30/2016 0503   LABSPEC 1.020 06/30/2016 0503   PHURINE 5.0 06/30/2016 0503   GLUCOSEU NEGATIVE 06/30/2016 0503   HGBUR NEGATIVE 06/30/2016 0503   BILIRUBINUR SMALL (A) 06/30/2016 0503   KETONESUR NEGATIVE 06/30/2016 0503   PROTEINUR 30 (A) 06/30/2016 0503   UROBILINOGEN 0.2 06/30/2012 2201   NITRITE NEGATIVE 06/30/2016 0503   LEUKOCYTESUR SMALL (A) 06/30/2016 0503   Sepsis Labs: Invalid input(s): PROCALCITONIN, LACTICIDVEN  No results found for this or any previous visit (from the past 240 hour(s)).  Radiology Studies: Dg Abd 2 Views  Result Date: 07/14/2016 CLINICAL DATA:  Lower abdominal pain with nausea and vomiting. Recent sigmoid colectomy. EXAM: ABDOMEN - 2 VIEW COMPARISON:  CT abdomen and pelvis 07/09/2016 FINDINGS: Gas is present in multiple loops of mildly to moderately dilated small bowel in the left and right mid abdomen with multiple small air-fluid levels. The residual oral contrast is present in the proximal colon. A small amount of gas is present throughout the colon. There is also contrast in the rectum. No definite intraperitoneal free air. Skin staples are noted over the lower abdomen. Prior lumbar fusion. IMPRESSION: Mild-to-moderate small bowel dilatation with small volume gas and contrast in the colon. Findings may reflect postoperative ileus, although partial small bowel obstruction is not  excluded. Electronically Signed   By: Logan Bores M.D.   On: 07/14/2016 09:33    Scheduled Meds: . amiodarone  200 mg Oral Daily  . carvedilol  50 mg Oral BID WC  . Chlorhexidine Gluconate Cloth  6 each Topical Once  . cinacalcet  30 mg Oral Q supper  . darbepoetin (ARANESP) injection - DIALYSIS  60 mcg Intravenous Q Wed-HD  . docusate sodium  200 mg Oral BID  . heparin  5,000 Units Subcutaneous Q8H  . nystatin  5 mL Oral QID  . sodium chloride flush  3 mL Intravenous Q12H  . Warfarin - Pharmacist Dosing Inpatient   Does not apply q1800   Continuous Infusions:   Hosie Poisson , MD,  [facility] Pager [phone]  If 7PM-7AM, please contact night-coverage [email or web address] Password [facility] 07/14/2016, 6:29 PM

## 2016-07-14 NOTE — Progress Notes (Signed)
ANTICOAGULATION CONSULT NOTE - Follow Up Consult  Pharmacy Consult for Coumadin Indication: atrial fibrillation  Allergies  Allergen Reactions  . Tape Other (See Comments)    PLASTIC TAPE TEARS OFF THE SKIN AND BRUISES IT TERRIBLY!!  . Latex Rash    Patient Measurements: Height: 5\' 5"  (165.1 cm) Weight: 236 lb 1.8 oz (107.1 kg) IBW/kg (Calculated) : 57  Vital Signs: Temp: 97.9 F (36.6 C) (12/25 0441) Temp Source: Oral (12/25 0441) BP: 170/58 (12/25 0509) Pulse Rate: 78 (12/25 0441)  Labs:  Recent Labs  07/12/16 0450 07/12/16 0900 07/13/16 0432 07/13/16 0900 07/14/16 0401  HGB 9.7* 9.7*  --  9.5*  --   HCT 30.4* 30.2*  --  29.4*  --   PLT 242 244  --  254  --   LABPROT 20.3*  --  21.9*  --  22.0*  INR 1.72  --  1.88  --  1.89  CREATININE 7.94* 8.23*  --  6.32*  --     Estimated Creatinine Clearance: 9.8 mL/min (by C-G formula based on SCr of 6.32 mg/dL (H)).   Medications:  Scheduled:  . amiodarone  200 mg Oral Daily  . carvedilol  50 mg Oral BID WC  . Chlorhexidine Gluconate Cloth  6 each Topical Once  . cinacalcet  30 mg Oral Q supper  . darbepoetin (ARANESP) injection - DIALYSIS  60 mcg Intravenous Q Wed-HD  . docusate sodium  200 mg Oral BID  . heparin  5,000 Units Subcutaneous Q8H  . nystatin  5 mL Oral QID  . sodium chloride flush  3 mL Intravenous Q12H  . Warfarin - Pharmacist Dosing Inpatient   Does not apply q1800    Assessment: 70 year old female with history of atrial fibrillation on warfarin prior to admission. INR was SUPRA-therapeutic on admission, warfarin was resumed but INR became elevated due to interaction with antibiotics. Warfarin was held, and then vitamin K 5mg  was given on 12/21 in preparation for OR on 12/22. Now to resume warfarin per surgery.  INR stable at 1.89, no bleeding noted.    Goal of Therapy:  INR 2-3 Monitor platelets by anticoagulation protocol: Yes   Plan:  Repeat Coumadin 5mg  today Continue Heparin SQ until  INR >= 2 Daily INR Watch for s/s of bleeding  Gracy Bruins, PharmD [facility]

## 2016-07-14 NOTE — Progress Notes (Signed)
3 Days Post-Op   Subjective: Very drowsy. States she is still nauseated. One episode of emesis recorded. Denies abdominal pain.  Objective: Vital signs in last 24 hours: Temp:  [97.9 F (36.6 C)-99.2 F (37.3 C)] 97.9 F (36.6 C) (12/25 0441) Pulse Rate:  [73-87] 78 (12/25 0441) Resp:  [11-20] 18 (12/25 0441) BP: (138-173)/(40-72) 170/58 (12/25 0509) SpO2:  [93 %-97 %] 93 % (12/25 0441) Weight:  [107.1 kg (236 lb 1.8 oz)] 107.1 kg (236 lb 1.8 oz) (12/24 0910) Last BM Date: 07/13/16  Intake/Output from previous day: 12/24 0701 - 12/25 0700 In: 123 [P.O.:120; I.V.:3] Out: 1201 [Emesis/NG output:200; Stool:1] Intake/Output this shift: No intake/output data recorded.  General appearance: cooperative, no distress and Very drowsy but responsive Resp: No wheezing or increased work of breathing GI: Mild diffuse tenderness. No guarding. No apparent distention. Incision/Wound: VAC dressing in place without erythema or unusual drainage  Lab Results:   Recent Labs  07/12/16 0900 07/13/16 0900  WBC 9.8 11.6*  HGB 9.7* 9.5*  HCT 30.2* 29.4*  PLT 244 254   BMET  Recent Labs  07/12/16 0900 07/13/16 0900  NA 133* 131*  K 4.0 3.9  CL 96* 96*  CO2 23 24  GLUCOSE 73 103*  BUN 19 13  CREATININE 8.23* 6.32*  CALCIUM 7.0* 7.5*     Studies/Results: No results found.  Anti-infectives: Anti-infectives    Start     Dose/Rate Route Frequency Ordered Stop   07/11/16 1700  piperacillin-tazobactam (ZOSYN) IVPB 3.375 g     3.375 g 12.5 mL/hr over 240 Minutes Intravenous Every 8 hours 07/11/16 1333 07/12/16 1701   07/11/16 0945  piperacillin-tazobactam (ZOSYN) IVPB 3.375 g     3.375 g 12.5 mL/hr over 240 Minutes Intravenous  Once 07/11/16 0944 07/11/16 1350   07/11/16 0800  ceFAZolin (ANCEF) IVPB 2g/100 mL premix  Status:  Discontinued     2 g 200 mL/hr over 30 Minutes Intravenous To ShortStay Surgical 07/10/16 1436 07/11/16 1453   07/11/16 0800  metroNIDAZOLE (FLAGYL) IVPB  500 mg  Status:  Discontinued     500 mg 100 mL/hr over 60 Minutes Intravenous To ShortStay Surgical 07/10/16 1436 07/11/16 1453   07/09/16 2200  piperacillin-tazobactam (ZOSYN) IVPB 3.375 g  Status:  Discontinued     3.375 g 12.5 mL/hr over 240 Minutes Intravenous Every 12 hours 07/08/16 1704 07/09/16 1242   07/09/16 2000  amoxicillin-clavulanate (AUGMENTIN) 500-125 MG per tablet 500 mg  Status:  Discontinued     1 tablet Oral Daily 07/09/16 1311 07/11/16 1508   07/09/16 1245  amoxicillin-clavulanate (AUGMENTIN) 875-125 MG per tablet 1 tablet  Status:  Discontinued     1 tablet Oral Every 12 hours 07/09/16 1242 07/09/16 1302   07/08/16 2200  amoxicillin-clavulanate (AUGMENTIN) 875-125 MG per tablet 1 tablet  Status:  Discontinued     1 tablet Oral Every 12 hours 07/08/16 1650 07/08/16 1704   07/08/16 2200  amoxicillin-clavulanate (AUGMENTIN) 875-125 MG per tablet 1 tablet     1 tablet Oral  Once 07/08/16 1704 07/08/16 2134   07/03/16 1015  piperacillin-tazobactam (ZOSYN) IVPB 3.375 g  Status:  Discontinued     3.375 g 12.5 mL/hr over 240 Minutes Intravenous Every 12 hours 07/03/16 1001 07/08/16 1704   07/01/16 2200  ciprofloxacin (CIPRO) tablet 500 mg  Status:  Discontinued     500 mg Oral Daily at bedtime 07/01/16 1400 07/03/16 0922   06/26/16 2200  ciprofloxacin (CIPRO) IVPB 400 mg  Status:  Discontinued  400 mg 200 mL/hr over 60 Minutes Intravenous Every 24 hours 06/26/16 0035 07/01/16 2111   06/25/16 2200  ciprofloxacin (CIPRO) tablet 500 mg  Status:  Discontinued     500 mg Oral Every 24 hours 06/25/16 2146 06/26/16 0030   06/25/16 2200  metroNIDAZOLE (FLAGYL) tablet 500 mg     500 mg Oral Every 8 hours 06/25/16 2146 07/02/16 2159      Assessment/Plan: s/p Procedure(s): LAPAROSCOPIC SIGMOID COLECTOMY Postop day #3 Still with nausea and 1 episode of emesis recorded. Mild leukocytosis. Abdomen seems benign. Probable ileus. Check abdominal x-rays today. I will leave on clear  liquids for now until nausea resolves.    LOS: 18 days    Jane [name] 07/14/2016

## 2016-07-14 NOTE — Progress Notes (Signed)
Subjective: Interval History: some nausea today  Objective: Vital signs in last 24 hours: Temp:  [97.9 F (36.6 C)-99.2 F (37.3 C)] 98.7 F (37.1 C) (12/25 1329) Pulse Rate:  [67-87] 67 (12/25 1329) Resp:  [18] 18 (12/25 1329) BP: (147-173)/(40-58) 149/44 (12/25 1329) SpO2:  [93 %-98 %] 98 % (12/25 1329) Weight change: -6.9 kg (-15 lb 3.4 oz)  Intake/Output from previous day: 12/24 0701 - 12/25 0700 In: 123 [P.O.:120; I.V.:3] Out: 1201 [Emesis/NG output:200; Stool:1] Intake/Output this shift: Total I/O In: 240 [P.O.:240] Out: -   Scheduled meds: . amiodarone  200 mg Oral Daily  . carvedilol  50 mg Oral BID WC  . Chlorhexidine Gluconate Cloth  6 each Topical Once  . cinacalcet  30 mg Oral Q supper  . darbepoetin (ARANESP) injection - DIALYSIS  60 mcg Intravenous Q Wed-HD  . docusate sodium  200 mg Oral BID  . heparin  5,000 Units Subcutaneous Q8H  . nystatin  5 mL Oral QID  . sodium chloride flush  3 mL Intravenous Q12H  . warfarin  5 mg Oral ONCE-1800  . Warfarin - Pharmacist Dosing Inpatient   Does not apply q1800   Exam: General appearance: alert and cooperative  Facial swelling slight. Moves appropriately Abd slightly tender, +bs Ext no edema  NF ox 3  Dialysis:  [facility] MWF   3h 56min  111.5kg   2/2 bath  Hep 2000   RFA AVF  - Hectorol 3 ug / hd  - Mircera 50 q 2wks, last 12/6   Assessment: 1 Acute diverticulitis/ microperforation-s/p sigmoid colectomy and re-anastomosis 12/22 2 ESRD HD mwf  3 HTN - BP work on vol 4 Atypical CP - trops neg, no further w/u per cards 5 Hx Afib - in NSR, since DCCV 6 yrs ago ([facility]), on amio/ coreg/ coumadin 6  Volume - Na down and BP's up, should try to lower dry wt some more tomorrow  Plan - next HD Wednesday, get vol down   Lab Results:  Recent Labs  07/12/16 0900 07/13/16 0900  WBC 9.8 11.6*  HGB 9.7* 9.5*  HCT 30.2* 29.4*  PLT 244 254   BMET:   Recent Labs  07/12/16 0900 07/13/16 0900  NA 133* 131*   K 4.0 3.9  CL 96* 96*  CO2 23 24  GLUCOSE 73 103*  BUN 19 13  CREATININE 8.23* 6.32*  CALCIUM 7.0* 7.5*   No results for input(s): PTH in the last 72 hours. Iron Studies: No results for input(s): IRON, TIBC, TRANSFERRIN, FERRITIN in the last 72 hours. Studies/Results: Dg Abd 2 Views  Result Date: 07/14/2016 CLINICAL DATA:  Lower abdominal pain with nausea and vomiting. Recent sigmoid colectomy. EXAM: ABDOMEN - 2 VIEW COMPARISON:  CT abdomen and pelvis 07/09/2016 FINDINGS: Gas is present in multiple loops of mildly to moderately dilated small bowel in the left and right mid abdomen with multiple small air-fluid levels. The residual oral contrast is present in the proximal colon. A small amount of gas is present throughout the colon. There is also contrast in the rectum. No definite intraperitoneal free air. Skin staples are noted over the lower abdomen. Prior lumbar fusion. IMPRESSION: Mild-to-moderate small bowel dilatation with small volume gas and contrast in the colon. Findings may reflect postoperative ileus, although partial small bowel obstruction is not excluded. Electronically Signed   By: Logan Bores M.D.   On: 07/14/2016 09:33    Scheduled: . amiodarone  200 mg Oral Daily  . carvedilol  50 mg Oral BID WC  . Chlorhexidine Gluconate Cloth  6 each Topical Once  . cinacalcet  30 mg Oral Q supper  . darbepoetin (ARANESP) injection - DIALYSIS  60 mcg Intravenous Q Wed-HD  . docusate sodium  200 mg Oral BID  . heparin  5,000 Units Subcutaneous Q8H  . nystatin  5 mL Oral QID  . sodium chloride flush  3 mL Intravenous Q12H  . warfarin  5 mg Oral ONCE-1800  . Warfarin - Pharmacist Dosing Inpatient   Does not apply [phone]

## 2016-07-15 LAB — PROTIME-INR
INR: 2.38
Prothrombin Time: 26.5 seconds — ABNORMAL HIGH (ref 11.4–15.2)

## 2016-07-15 MED ORDER — WARFARIN SODIUM 2 MG PO TABS
2.0000 mg | ORAL_TABLET | Freq: Once | ORAL | Status: AC
  Administered 2016-07-15: 2 mg via ORAL
  Filled 2016-07-15: qty 1

## 2016-07-15 MED ORDER — SODIUM CHLORIDE 0.9% FLUSH
10.0000 mL | INTRAVENOUS | Status: DC | PRN
  Administered 2016-07-15 – 2016-07-17 (×2): 10 mL
  Filled 2016-07-15 (×2): qty 40

## 2016-07-15 NOTE — Progress Notes (Signed)
PT Cancellation Note  Patient Details Name: Saanvika Vazques MRN: [id number] DOB: 08-Apr-1946   Cancelled Treatment:    Reason Eval/Treat Not Completed: Patient declined. Pt reports that she was finally able to eat this afternoon and is now having gas pais in her stomach. Reports that she will walk with staff on the floor but doesn't feel like walking now.    Scheryl Marten PT, DPT  [phone]  07/15/2016, 4:49 PM

## 2016-07-15 NOTE — Progress Notes (Signed)
ANTICOAGULATION CONSULT NOTE - Follow Up Consult  Pharmacy Consult for Coumadin Indication: atrial fibrillation  Allergies  Allergen Reactions  . Tape Other (See Comments)    PLASTIC TAPE TEARS OFF THE SKIN AND BRUISES IT TERRIBLY!!  . Latex Rash    Patient Measurements: Height: 5\' 5"  (165.1 cm) Weight: 236 lb 1.8 oz (107.1 kg) IBW/kg (Calculated) : 57  Vital Signs: Temp: 98.6 F (37 C) (12/26 0439) Temp Source: Oral (12/26 0439) BP: 138/53 (12/26 0439) Pulse Rate: 66 (12/26 0439)  Labs:  Recent Labs  07/13/16 0432 07/13/16 0900 07/14/16 0401 07/15/16 0409  HGB  --  9.5*  --   --   HCT  --  29.4*  --   --   PLT  --  254  --   --   LABPROT 21.9*  --  22.0* 26.5*  INR 1.88  --  1.89 2.38  CREATININE  --  6.32*  --   --     Estimated Creatinine Clearance: 9.8 mL/min (by C-G formula based on SCr of 6.32 mg/dL (H)).   Medications:  Scheduled:  . amiodarone  200 mg Oral Daily  . carvedilol  50 mg Oral BID WC  . Chlorhexidine Gluconate Cloth  6 each Topical Once  . cinacalcet  30 mg Oral Q supper  . darbepoetin (ARANESP) injection - DIALYSIS  60 mcg Intravenous Q Wed-HD  . docusate sodium  200 mg Oral BID  . heparin  5,000 Units Subcutaneous Q8H  . nystatin  5 mL Oral QID  . sodium chloride flush  3 mL Intravenous Q12H  . Warfarin - Pharmacist Dosing Inpatient   Does not apply q1800    Assessment: 70 year old female with history of atrial fibrillation on warfarinprior to admission. INR was SUPRA-therapeutic on admission, warfarin was resumed but INR became elevated due to interaction with antibiotics. Warfarin was held, and then vitamin K 5mg  was given on 12/21 in preparation for OR on 12/22.     Coumadin resumed 12/24, with INR up to 2.38 this AM.  No bleeding noted, no labs.  Will back off dose today due to previous large jump in INR and poor po intake (lack of VitK).  Goal of Therapy:  INR 2-3 Monitor platelets by anticoagulation protocol: Yes   Plan:   Coumadin 2mg  Daily INR D/C heparin SQ Watch for s/s of bleeding  Gracy Bruins, PharmD [facility]

## 2016-07-15 NOTE — Care Management Important Message (Signed)
Important Message  Patient Details  Name: Jane [name] MRN: [id number] Date of Birth: [birth year]   Medicare Important Message Given:  Yes    Ziyana Morikawa 07/15/2016, 2:20 PM

## 2016-07-15 NOTE — Progress Notes (Signed)
[facility] Progress Note   Subjective: "I'm getting crazy-I don't remember things as well and I have these strange dreams". Says she dreamed she was talking to a doctor-which actually she was talking to Theodoro Grist PA who saw her earlier this AM.  Alert, oriented x 3. Says she has been taking some liquids, nausea has subsided, some abdominal tenderness. Says she feels the better today than she has in some time. Says she threw up black emesis yesterday.   Objective Vitals:   07/14/16 0509 07/14/16 1329 07/14/16 1931 07/15/16 0439  BP: (!) 170/58 (!) 149/44 (!) 148/46 (!) 138/53  Pulse:  67 70 66  Resp:  18 18 17   Temp:  98.7 F (37.1 C) 98.2 F (36.8 C) 98.6 F (37 C)  TempSrc:  Oral Oral Oral  SpO2:  98% 92% 92%  Weight:      Height:       Physical Exam General: Pleasant, NAD Heart: K4,M0 1/6 systolic M. RRR Lungs: CTAB A/P Abdomen: hypoactive BS. Wound management system to incision mid lower abd. Minimal drainage.  Extremities: No LE edema.  Dialysis Access: RFA AVF + bruit    Additional Objective Labs: Basic Metabolic Panel:  Recent Labs Lab 07/09/16 0730  07/12/16 0450 07/12/16 0900 07/13/16 0900  NA 131*  < > 132* 133* 131*  K 3.6  < > 3.9 4.0 3.9  CL 93*  < > 96* 96* 96*  CO2 25  < > 23 23 24   GLUCOSE 81  < > 77 73 103*  BUN 12  < > 17 19 13   CREATININE 7.25*  < > 7.94* 8.23* 6.32*  CALCIUM 7.5*  < > 7.0* 7.0* 7.5*  PHOS 4.7*  --   --  6.0* 4.1  < > = values in this interval not displayed. Liver Function Tests:  Recent Labs Lab 07/09/16 0730 07/12/16 0900 07/13/16 0900  ALBUMIN 2.8* 2.5* 2.6*   No results for input(s): LIPASE, AMYLASE in the last 168 hours. CBC:  Recent Labs Lab 07/09/16 0730 07/10/16 0730 07/12/16 0450 07/12/16 0900 07/13/16 0900  WBC 5.6 5.4 8.6 9.8 11.6*  HGB 10.3* 10.3* 9.7* 9.7* 9.5*  HCT 32.0* 32.3* 30.4* 30.2* 29.4*  MCV 89.6 89.2 88.4 87.8 88.0  PLT 242 217 242 244 254   Blood Culture     Component Value Date/Time   SDES URINE, CLEAN CATCH 06/30/2012 2201   SPECREQUEST NONE 06/30/2012 2201   CULT KLEBSIELLA PNEUMONIAE 06/30/2012 2201   REPTSTATUS 07/02/2012 FINAL 06/30/2012 2201    Cardiac Enzymes: No results for input(s): CKTOTAL, CKMB, CKMBINDEX, TROPONINI in the last 168 hours. CBG: No results for input(s): GLUCAP in the last 168 hours. Iron Studies: No results for input(s): IRON, TIBC, TRANSFERRIN, FERRITIN in the last 72 hours. @lablastinr3 @ Studies/Results: Dg Abd 2 Views  Result Date: 07/14/2016 CLINICAL DATA:  Lower abdominal pain with nausea and vomiting. Recent sigmoid colectomy. EXAM: ABDOMEN - 2 VIEW COMPARISON:  CT abdomen and pelvis 07/09/2016 FINDINGS: Gas is present in multiple loops of mildly to moderately dilated small bowel in the left and right mid abdomen with multiple small air-fluid levels. The residual oral contrast is present in the proximal colon. A small amount of gas is present throughout the colon. There is also contrast in the rectum. No definite intraperitoneal free air. Skin staples are noted over the lower abdomen. Prior lumbar fusion. IMPRESSION: Mild-to-moderate small bowel dilatation with small volume gas and contrast in the colon. Findings may reflect postoperative ileus,  although partial small bowel obstruction is not excluded. Electronically Signed   By: Logan Bores M.D.   On: 07/14/2016 09:33   Medications:  . amiodarone  200 mg Oral Daily  . carvedilol  50 mg Oral BID WC  . Chlorhexidine Gluconate Cloth  6 each Topical Once  . cinacalcet  30 mg Oral Q supper  . darbepoetin (ARANESP) injection - DIALYSIS  60 mcg Intravenous Q Wed-HD  . docusate sodium  200 mg Oral BID  . heparin  5,000 Units Subcutaneous Q8H  . nystatin  5 mL Oral QID  . sodium chloride flush  3 mL Intravenous Q12H  . Warfarin - Pharmacist Dosing Inpatient   Does not apply q1800     Dialysis:  [facility] MWF   3h 58min  111.5kg   2/2 bath  Hep 2000   RFA AVF  -  Hectorol 3 ug / hd  - Mircera 50 q 2wks, last 12/6   Assessment: 1 Acute diverticulitis/ microperforation-s/p sigmoid colectomy w re-anastomosis 12/22. Post op day 4. Gen Surgery following.  Post op ileus on 2 view abd 07/14/16. Checking for FOB D/T dark emesis and stool.  2 ESRD HD MWF. Next HD 07/06/16. Renal panel prior to HD. 3.0 K bath if K+ less than 4.0. No heparin.  3 HTN - Better control of BP after HD 07/13/16. Cont. Coreg.  4 Atypical CP - trops neg, no further w/u per cards 5 Hx Afib - in NSR, since DCCV 6 yrs ago ([facility]), on amio/ coreg/ coumadin. INR 2.38 today.  6  Volume - Na 131, hypertensive 07/13/16. Last HD 07/03/16 Pre wt 107 kg Net UF 1000. No post wt. Under OP EDW. Better control of BP. Lower EDW on DC. UFG 1.5-2.5 liters tomorrow.  7. Nutrition: Albumin 2.6. Diet adv to bariatric full lig.  8. Anemia: HGB 9.5 today. Last ESA dose 07/09/16-next dose tomorrow.    Rita H. Brown NP-C 07/15/2016, 10:30 AM  [facility] [phone]  Pt seen, examined and agree w A/P as above.  Kelly Splinter MD [employer] pager [phone]   07/15/2016, 11:06 AM

## 2016-07-15 NOTE — Progress Notes (Signed)
4 Days Post-Op  Subjective: Reports she threw up yesterday and it was black AM, she was treated x 2 yesterday for nausea. She says she is having some loose dark stools now.  Few BS and flatus.  She was really sound asleep when I came in and it took her some time to wake up. Wound vac in place and not much drainage.  She reports stool and emesis is the same color  Objective: Vital signs in last 24 hours: Temp:  [98.2 F (36.8 C)-98.7 F (37.1 C)] 98.6 F (37 C) (12/26 0439) Pulse Rate:  [66-70] 66 (12/26 0439) Resp:  [17-18] 17 (12/26 0439) BP: (138-149)/(44-53) 138/53 (12/26 0439) SpO2:  [92 %-98 %] 92 % (12/26 0439) Last BM Date: 07/14/16 240 Po recorded No urine  BM x 2 recorded Afebrile, VSS INR 2.38 only lab today so far  Intake/Output from previous day: 12/25 0701 - 12/26 0700 In: 240 [P.O.:240] Out: 0  Intake/Output this shift: No intake/output data recorded.  General appearance: alert, cooperative, no distress and after she finally woke up GI: soft, few BS, she reports some diarrhea and flatus  Lab Results:   Recent Labs  07/12/16 0900 07/13/16 0900  WBC 9.8 11.6*  HGB 9.7* 9.5*  HCT 30.2* 29.4*  PLT 244 254    BMET  Recent Labs  07/12/16 0900 07/13/16 0900  NA 133* 131*  K 4.0 3.9  CL 96* 96*  CO2 23 24  GLUCOSE 73 103*  BUN 19 13  CREATININE 8.23* 6.32*  CALCIUM 7.0* 7.5*   PT/INR  Recent Labs  07/14/16 0401 07/15/16 0409  LABPROT 22.0* 26.5*  INR 1.89 2.38     Recent Labs Lab 07/09/16 0730 07/12/16 0900 07/13/16 0900  ALBUMIN 2.8* 2.5* 2.6*     Lipase  No results found for: LIPASE   Studies/Results: Dg Abd 2 Views  Result Date: 07/14/2016 CLINICAL DATA:  Lower abdominal pain with nausea and vomiting. Recent sigmoid colectomy. EXAM: ABDOMEN - 2 VIEW COMPARISON:  CT abdomen and pelvis 07/09/2016 FINDINGS: Gas is present in multiple loops of mildly to moderately dilated small bowel in the left and right mid abdomen with  multiple small air-fluid levels. The residual oral contrast is present in the proximal colon. A small amount of gas is present throughout the colon. There is also contrast in the rectum. No definite intraperitoneal free air. Skin staples are noted over the lower abdomen. Prior lumbar fusion. IMPRESSION: Mild-to-moderate small bowel dilatation with small volume gas and contrast in the colon. Findings may reflect postoperative ileus, although partial small bowel obstruction is not excluded. Electronically Signed   By: Logan Bores M.D.   On: 07/14/2016 09:33    Medications: . amiodarone  200 mg Oral Daily  . carvedilol  50 mg Oral BID WC  . Chlorhexidine Gluconate Cloth  6 each Topical Once  . cinacalcet  30 mg Oral Q supper  . darbepoetin (ARANESP) injection - DIALYSIS  60 mcg Intravenous Q Wed-HD  . docusate sodium  200 mg Oral BID  . heparin  5,000 Units Subcutaneous Q8H  . nystatin  5 mL Oral QID  . sodium chloride flush  3 mL Intravenous Q12H  . Warfarin - Pharmacist Dosing Inpatient   Does not apply q1800     Assessment/Plan Diverticulitis with perforation S/p laparoscopic-assisted sigmoid colectomy with colorectal anastomosis, mobilization of splenic flexure, 07/11/16, [name] (findings: diverticulitis with inflammatory adhesions to the uterus and abdominal wall) ESRD -HD - MWF PAF Hypertension  Anemia FEN: No IV fluids/Clears with fluid restriction ID:  Antibiotics 12/06 -07/12/16; none since 07/12/16 DVT:  SCD/Warfarin per pharmacy -  INR 2.38   Plan:  Check stool for occult blood, recheck labs in AM, check wound tomorrow, wound vac comes off then, and work to mobilize her more.  Film yesterday consistent with ileus.  Seen by Dr. Excell Seltzer, go to full bariatric liquids.     LOS: 19 days    Mallorie Norrod 07/15/2016 [phone]

## 2016-07-15 NOTE — Progress Notes (Signed)
PROGRESS NOTE  Glorious Flicker SNK:[id number] DOB: 1946-05-08 DOA: 06/25/2016 PCP: [facility]   LOS: 19 days   Brief Narrative: 70 y.o.BF PMHx ESRD on HD M/W/F, A-Fib on warfarin, HTN, and anemiaWho presents with chest pain for 1 day and abdominal pain for 3 months. Regarding chest pain, this was a new complaint today. The patient went to HD, and during her session, developed a sharp, severe, constant pain under her right breast associated with shortness of breath and nausea. She finished her treatment, then the HD nurse administered O2 and nitroglycerin, which caused her pain to subside, and recommended she go to the ER.  Patient was initially placed on antibiotics with ciprofloxacin and metronidazole, she was very slow to improve then failed advancement of her diet, general surgery was consulted. Her antibodies were transitioned to Zosyn, and another attempt was made to advance her diet as she was appearing to improve clinically over the weekend. However when she got to soft diet her pain returned. She was put on clear liquid diet and repeat CT abd and pelvis ordered showed possible microperforation. She was taken to OR on 12/22 for lap sigmoid colectomy with colorectal anastomosis and mobilization of splenic flexure.   She was started on clear liquid diet and advanced to full liquid. Recommended to ambulate as much as she can.    Assessment & Plan: Principal Problem:   Diverticulitis of large intestine with perforation Active Problems:   ESRD on hemodialysis (HCC)   Paroxysmal atrial fibrillation (HCC)   Essential hypertension   Anemia in other chronic diseases classified elsewhere   Chest pain with moderate risk for cardiac etiology  Diverticulitis with perforation - repeat CT scan 12/20 shows  Severe sigmoid diverticulitis with similar contained microperforation, no drainable fluid collection. Considering eccentric nature of the mural thickening, recommend follow-up  endoscopy after resolution of acute symptoms to exclude mass - she was initially on Cipro flagyl , transitioned to zosyn and discontinued after surgery.  - though she remains afebrile without any leukocytosis, she has persistent abdominal pain , .  - she underwent lap sigmoid colectomy with colorectal anastomosis and splenic flexure mobilization on 12/22.   Started clears, continues to have nausea and vomiting on 12/24- 12/25. abd x ray shows dilated bowel loops, possibly post ileus vs SBO.  Recommended ambulate in the halls. Today she reports feeling better, advanced to full liquid diet.    ESRD on HD - Patient clinically continues to be euvolemic. - HD per nephrology, she is on MWF schedule-   HTN - better controlled.  - continue Coreg   Paroxysmal atrial fibrillation - Currently in NSR - coumadin restarted.  - therapeutic INR.  Hyponatremia: probably from ESRD.    Anemia of chronic Renal disease - No signs of bleeding.  Stable around 10.    DVT prophylaxis: Coumadin . Code Status: Full Family Communication: no family bedside, CALLED daughter and updated today.  Disposition Plan: home when ready   Consultants:   General surgery  Cardiology   Nephrology   Procedures:   None   Antimicrobials:  Zosyn 12/14 >>   Subjective: No vomiting and nausea improved.    Objective: Vitals:   07/14/16 1329 07/14/16 1931 07/15/16 0439 07/15/16 1432  BP: (!) 149/44 (!) 148/46 (!) 138/53 (!) 143/52  Pulse: 67 70 66 65  Resp: 18 18 17    Temp: 98.7 F (37.1 C) 98.2 F (36.8 C) 98.6 F (37 C) 98.2 F (36.8 C)  TempSrc: Oral Oral  Oral Oral  SpO2: 98% 92% 92% 95%  Weight:      Height:        Intake/Output Summary (Last 24 hours) at 07/15/16 1919 Last data filed at 07/15/16 1500  Gross per 24 hour  Intake              120 ml  Output                0 ml  Net              120 ml   Filed Weights   07/12/16 0820 07/12/16 1205 07/13/16 0910  Weight: 114 kg (251  lb 5.2 oz) 112 kg (246 lb 14.6 oz) 107.1 kg (236 lb 1.8 oz)    Examination: Constitutional: NAD Vitals:   07/14/16 1329 07/14/16 1931 07/15/16 0439 07/15/16 1432  BP: (!) 149/44 (!) 148/46 (!) 138/53 (!) 143/52  Pulse: 67 70 66 65  Resp: 18 18 17    Temp: 98.7 F (37.1 C) 98.2 F (36.8 C) 98.6 F (37 C) 98.2 F (36.8 C)  TempSrc: Oral Oral Oral Oral  SpO2: 98% 92% 92% 95%  Weight:      Height:       Respiratory: clear to auscultation bilaterally, no wheezing, no crackles.  Cardiovascular: Regular rate and rhythm, no murmurs / rubs / gallops. No LE edema. Abdomen: persistent left lower quadrant pain,  Bowel sounds positive.  Musculoskeletal: no clubbing / cyanosis. Neuro: non focal, strength equal     Data Reviewed: I have personally reviewed following labs and imaging studies  CBC:  Recent Labs Lab 07/09/16 0730 07/10/16 0730 07/12/16 0450 07/12/16 0900 07/13/16 0900  WBC 5.6 5.4 8.6 9.8 11.6*  HGB 10.3* 10.3* 9.7* 9.7* 9.5*  HCT 32.0* 32.3* 30.4* 30.2* 29.4*  MCV 89.6 89.2 88.4 87.8 88.0  PLT 242 217 242 244 258   Basic Metabolic Panel:  Recent Labs Lab 07/09/16 0730 07/10/16 0730 07/11/16 1411 07/12/16 0450 07/12/16 0900 07/13/16 0900  NA 131* 129* 130* 132* 133* 131*  K 3.6 3.5 3.9 3.9 4.0 3.9  CL 93* 93* 96* 96* 96* 96*  CO2 25 26 24 23 23 24   GLUCOSE 81 76 75 77 73 103*  BUN 12 9 15 17 19 13   CREATININE 7.25* 5.14* 6.90* 7.94* 8.23* 6.32*  CALCIUM 7.5* 7.5* 7.1* 7.0* 7.0* 7.5*  MG  --   --   --  2.0  --   --   PHOS 4.7*  --   --   --  6.0* 4.1   GFR: Estimated Creatinine Clearance: 9.8 mL/min (by C-G formula based on SCr of 6.32 mg/dL (H)). Liver Function Tests:  Recent Labs Lab 07/09/16 0730 07/12/16 0900 07/13/16 0900  ALBUMIN 2.8* 2.5* 2.6*   No results for input(s): LIPASE, AMYLASE in the last 168 hours. No results for input(s): AMMONIA in the last 168 hours. Coagulation Profile:  Recent Labs Lab 07/11/16 0810 07/12/16 0450  07/13/16 0432 07/14/16 0401 07/15/16 0409  INR 1.84 1.72 1.88 1.89 2.38   Cardiac Enzymes: No results for input(s): CKTOTAL, CKMB, CKMBINDEX, TROPONINI in the last 168 hours. BNP (last 3 results) No results for input(s): PROBNP in the last 8760 hours. HbA1C: No results for input(s): HGBA1C in the last 72 hours. CBG: No results for input(s): GLUCAP in the last 168 hours. Lipid Profile: No results for input(s): CHOL, HDL, LDLCALC, TRIG, CHOLHDL, LDLDIRECT in the last 72 hours. Thyroid Function Tests: No results for input(s): TSH,  T4TOTAL, FREET4, T3FREE, THYROIDAB in the last 72 hours. Anemia Panel: No results for input(s): VITAMINB12, FOLATE, FERRITIN, TIBC, IRON, RETICCTPCT in the last 72 hours. Urine analysis:    Component Value Date/Time   COLORURINE AMBER (A) 06/30/2016 0503   APPEARANCEUR HAZY (A) 06/30/2016 0503   LABSPEC 1.020 06/30/2016 0503   PHURINE 5.0 06/30/2016 0503   GLUCOSEU NEGATIVE 06/30/2016 0503   HGBUR NEGATIVE 06/30/2016 0503   BILIRUBINUR SMALL (A) 06/30/2016 0503   KETONESUR NEGATIVE 06/30/2016 0503   PROTEINUR 30 (A) 06/30/2016 0503   UROBILINOGEN 0.2 06/30/2012 2201   NITRITE NEGATIVE 06/30/2016 0503   LEUKOCYTESUR SMALL (A) 06/30/2016 0503   Sepsis Labs: Invalid input(s): PROCALCITONIN, LACTICIDVEN  No results found for this or any previous visit (from the past 240 hour(s)).  Radiology Studies: Dg Abd 2 Views  Result Date: 07/14/2016 CLINICAL DATA:  Lower abdominal pain with nausea and vomiting. Recent sigmoid colectomy. EXAM: ABDOMEN - 2 VIEW COMPARISON:  CT abdomen and pelvis 07/09/2016 FINDINGS: Gas is present in multiple loops of mildly to moderately dilated small bowel in the left and right mid abdomen with multiple small air-fluid levels. The residual oral contrast is present in the proximal colon. A small amount of gas is present throughout the colon. There is also contrast in the rectum. No definite intraperitoneal free air. Skin staples  are noted over the lower abdomen. Prior lumbar fusion. IMPRESSION: Mild-to-moderate small bowel dilatation with small volume gas and contrast in the colon. Findings may reflect postoperative ileus, although partial small bowel obstruction is not excluded. Electronically Signed   By: Logan Bores M.D.   On: 07/14/2016 09:33    Scheduled Meds: . amiodarone  200 mg Oral Daily  . carvedilol  50 mg Oral BID WC  . Chlorhexidine Gluconate Cloth  6 each Topical Once  . cinacalcet  30 mg Oral Q supper  . darbepoetin (ARANESP) injection - DIALYSIS  60 mcg Intravenous Q Wed-HD  . docusate sodium  200 mg Oral BID  . nystatin  5 mL Oral QID  . sodium chloride flush  3 mL Intravenous Q12H  . Warfarin - Pharmacist Dosing Inpatient   Does not apply q1800   Continuous Infusions:   Hosie Poisson , MD,  [facility] Pager [phone]  If 7PM-7AM, please contact night-coverage [email or web address] Password [facility] 07/15/2016, 7:19 PM

## 2016-07-16 DIAGNOSIS — D638 Anemia in other chronic diseases classified elsewhere: Secondary | ICD-10-CM

## 2016-07-16 DIAGNOSIS — K922 Gastrointestinal hemorrhage, unspecified: Secondary | ICD-10-CM

## 2016-07-16 LAB — RENAL FUNCTION PANEL
Albumin: 2.5 g/dL — ABNORMAL LOW (ref 3.5–5.0)
Anion gap: 11 (ref 5–15)
BUN: 35 mg/dL — ABNORMAL HIGH (ref 6–20)
CO2: 25 mmol/L (ref 22–32)
Calcium: 7.9 mg/dL — ABNORMAL LOW (ref 8.9–10.3)
Chloride: 98 mmol/L — ABNORMAL LOW (ref 101–111)
Creatinine, Ser: 8.75 mg/dL — ABNORMAL HIGH (ref 0.44–1.00)
GFR calc Af Amer: 5 mL/min — ABNORMAL LOW (ref >60)
GFR calc non Af Amer: 4 mL/min — ABNORMAL LOW (ref >60)
Glucose, Bld: 82 mg/dL (ref 65–99)
Phosphorus: 4.4 mg/dL (ref 2.5–4.6)
Potassium: 3.7 mmol/L (ref 3.5–5.1)
Sodium: 134 mmol/L — ABNORMAL LOW (ref 135–145)

## 2016-07-16 LAB — ABO/RH: ABO/RH(D): O POS

## 2016-07-16 LAB — CBC
HCT: 28.1 % — ABNORMAL LOW (ref 36.0–46.0)
HCT: 27.1 % — ABNORMAL LOW (ref 36.0–46.0)
Hemoglobin: 9 g/dL — ABNORMAL LOW (ref 12.0–15.0)
Hemoglobin: 8.7 g/dL — ABNORMAL LOW (ref 12.0–15.0)
MCH: 28.2 pg (ref 26.0–34.0)
MCH: 28.3 pg (ref 26.0–34.0)
MCHC: 32 g/dL (ref 30.0–36.0)
MCHC: 32.1 g/dL (ref 30.0–36.0)
MCV: 88.1 fL (ref 78.0–100.0)
MCV: 88.3 fL (ref 78.0–100.0)
PLATELETS: 288 10*3/uL (ref 150–400)
Platelets: 283 K/uL (ref 150–400)
RBC: 3.19 MIL/uL — AB (ref 3.87–5.11)
RBC: 3.07 MIL/uL — ABNORMAL LOW (ref 3.87–5.11)
RDW: 17.3 % — ABNORMAL HIGH (ref 11.5–15.5)
RDW: 17.3 % — ABNORMAL HIGH (ref 11.5–15.5)
WBC: 5.5 10*3/uL (ref 4.0–10.5)
WBC: 5.8 10*3/uL (ref 4.0–10.5)

## 2016-07-16 LAB — HEMOGLOBIN AND HEMATOCRIT, BLOOD
HCT: 28.4 % — ABNORMAL LOW (ref 36.0–46.0)
HEMOGLOBIN: 9.1 g/dL — AB (ref 12.0–15.0)

## 2016-07-16 LAB — PROTIME-INR
INR: 2.59
PROTHROMBIN TIME: 28.2 s — AB (ref 11.4–15.2)

## 2016-07-16 LAB — TYPE AND SCREEN
ABO/RH(D): O POS
ANTIBODY SCREEN: NEGATIVE

## 2016-07-16 MED ORDER — SODIUM CHLORIDE 0.9 % IV SOLN: 100.0000 mL | INTRAVENOUS | Status: DC | PRN

## 2016-07-16 MED ORDER — LIDOCAINE HCL (PF) 1 % IJ SOLN: 5.0000 mL | INTRAMUSCULAR | Status: DC | PRN

## 2016-07-16 MED ORDER — DARBEPOETIN ALFA 60 MCG/0.3ML IJ SOSY
PREFILLED_SYRINGE | INTRAMUSCULAR | Status: AC
  Filled 2016-07-16: qty 0.3

## 2016-07-16 MED ORDER — LIDOCAINE-PRILOCAINE 2.5-2.5 % EX CREA: 1.0000 "application " | TOPICAL_CREAM | CUTANEOUS | Status: DC | PRN

## 2016-07-16 MED ORDER — PENTAFLUOROPROP-TETRAFLUOROETH EX AERO: 1.0000 "application " | INHALATION_SPRAY | CUTANEOUS | Status: DC | PRN

## 2016-07-16 MED ORDER — WARFARIN SODIUM 2 MG PO TABS
2.0000 mg | ORAL_TABLET | Freq: Once | ORAL | Status: DC
  Filled 2016-07-16: qty 1

## 2016-07-16 MED ORDER — PANTOPRAZOLE SODIUM 40 MG IV SOLR
40.0000 mg | Freq: Two times a day (BID) | INTRAVENOUS | Status: DC
  Administered 2016-07-16 – 2016-07-18 (×4): 40 mg via INTRAVENOUS
  Filled 2016-07-16 (×4): qty 40

## 2016-07-16 MED ORDER — VITAMIN K1 10 MG/ML IJ SOLN
2.0000 mg | Freq: Once | INTRAMUSCULAR | Status: AC
  Administered 2016-07-16: 2 mg via INTRAVENOUS
  Filled 2016-07-16: qty 0.2

## 2016-07-16 NOTE — Progress Notes (Signed)
Physical Therapy Treatment Patient Details Name: Jane [name] MRN: [id number] DOB: [birth year] Today's Date: 07/16/2016    History of Present Illness Admitted 06/25/16 and underwent LAPAROSCOPIC SIGMOID COLECTOMY on 07/11/16.  PMH significant for : ESRD, HTN, A-fib.       PT Comments    Session limited due to MD request for pt back to bed due to bleeding/anemia. Last documented Hgb 8.7. PT will continue to follow acutely.   Follow Up Recommendations  Home health PT;Supervision for mobility/OOB     Equipment Recommendations  Rolling walker with 5" wheels    Recommendations for Other Services       Precautions / Restrictions Precautions Precautions: Fall Restrictions Weight Bearing Restrictions: No    Mobility  Bed Mobility Overal bed mobility: Modified Independent             General bed mobility comments: increased time and effort  Transfers Overall transfer level: Needs assistance Equipment used: Rolling walker (2 wheeled) Transfers: Sit to/from Stand Sit to Stand: Supervision         General transfer comment: supervision for safety; cues for hand placement  Ambulation/Gait Ambulation/Gait assistance: Min guard Ambulation Distance (Feet): 20 Feet Assistive device: Rolling walker (2 wheeled) Gait Pattern/deviations: Step-through pattern;Decreased stride length     General Gait Details: pt with slow, steady gait; stopped at doorway by MD who requested pt go back to bed due to unknown source of bleeding; last documented Hgb 8.7   Stairs            Wheelchair Mobility    Modified Rankin (Stroke Patients Only)       Balance                                    Cognition Arousal/Alertness: Awake/alert Behavior During Therapy: WFL for tasks assessed/performed Overall Cognitive Status: Within Functional Limits for tasks assessed                      Exercises      General Comments        Pertinent Vitals/Pain Pain  Assessment: Faces Faces Pain Scale: Hurts a little bit Pain Location: abdomen Pain Descriptors / Indicators: Discomfort Pain Intervention(s): Monitored during session    Home Living                      Prior Function            PT Goals (current goals can now be found in the care plan section) Acute Rehab PT Goals Patient Stated Goal: to go home Progress towards PT goals: Progressing toward goals    Frequency    Min 3X/week      PT Plan Current plan remains appropriate    Co-evaluation             End of Session Equipment Utilized During Treatment: Gait belt Activity Tolerance: Patient tolerated treatment well Patient left: with call bell/phone within reach;in bed     Time: 1451-1500 PT Time Calculation (min) (ACUTE ONLY): 9 min  Charges:  $Gait Training: 8-22 mins                    G Codes:      Salina April, PTA Pager: [phone]   07/16/2016, 3:17 PM

## 2016-07-16 NOTE — Progress Notes (Signed)
[facility] Progress Note   Subjective:  No new c/o   Objective Vitals:   07/16/16 1000 07/16/16 1030 07/16/16 1100 07/16/16 1119  BP: (!) 107/47 129/75 122/60 114/60  Pulse: (!) 58 63 (!) 58 60  Resp:    18  Temp:    97.7 F (36.5 C)  TempSrc:    Oral  SpO2:    98%  Weight:    104.7 kg (230 lb 13.2 oz)  Height:       Physical Exam General: Pleasant, NAD Heart: X8,P3 1/6 systolic M. RRR Lungs: CTAB A/P Abdomen: hypoactive BS. Wound management system to incision mid lower abd. Minimal drainage.  Extremities: No LE edema.  Dialysis Access: RFA AVF + bruit   Dialysis:  [facility] MWF   3h 36min  111.5kg   2/2 bath  Hep 2000   RFA AVF  - Hectorol 3 ug / hd  - Mircera 50 q 2wks, last 12/6   Assessment: 1 Acute diverticulitis/ microperforation-s/p sigmoid colectomy w re-anastomosis 12/22. Per gen surg 2 ESRD HD MWF. HD today. Can resume hep next HD 3 HTN - good control on Coreg 50 bid, vol down 4 Atypical CP - trops neg, no further w/u per cards 5 Hx Afib - in NSR, since DCCV 6 yrs ago ([facility]), on amio/ coreg/ coumadin. INR 2.38 today.  6  Vol excess - resolved, dry wt down 6kg from in hospital wt loss d/t #1 7. Nutrition: Albumin 2.6.   8. Anemia: HGB 9.5 today. Due to get ESA today    Plan - Hd today  Kelly Splinter MD [facility] pgr [phone]   07/16/2016, 12:07 PM   Additional Objective Labs: Basic Metabolic Panel:  Recent Labs Lab 07/12/16 0900 07/13/16 0900 07/16/16 0739  NA 133* 131* 134*  K 4.0 3.9 3.7  CL 96* 96* 98*  CO2 23 24 25   GLUCOSE 73 103* 82  BUN 19 13 35*  CREATININE 8.23* 6.32* 8.75*  CALCIUM 7.0* 7.5* 7.9*  PHOS 6.0* 4.1 4.4   Liver Function Tests:  Recent Labs Lab 07/12/16 0900 07/13/16 0900 07/16/16 0739  ALBUMIN 2.5* 2.6* 2.5*   No results for input(s): LIPASE, AMYLASE in the last 168 hours. CBC:  Recent Labs Lab 07/12/16 0450 07/12/16 0900 07/13/16 0900 07/16/16 0446  07/16/16 0739  WBC 8.6 9.8 11.6* 5.5 5.8  HGB 9.7* 9.7* 9.5* 9.0* 8.7*  HCT 30.4* 30.2* 29.4* 28.1* 27.1*  MCV 88.4 87.8 88.0 88.1 88.3  PLT 242 244 254 288 283   Blood Culture    Component Value Date/Time   SDES URINE, CLEAN CATCH 06/30/2012 2201   SPECREQUEST NONE 06/30/2012 2201   CULT KLEBSIELLA PNEUMONIAE 06/30/2012 2201   REPTSTATUS 07/02/2012 FINAL 06/30/2012 2201    Cardiac Enzymes: No results for input(s): CKTOTAL, CKMB, CKMBINDEX, TROPONINI in the last 168 hours. CBG: No results for input(s): GLUCAP in the last 168 hours. Iron Studies: No results for input(s): IRON, TIBC, TRANSFERRIN, FERRITIN in the last 72 hours. @lablastinr3 @ Studies/Results: No results found. Medications:  . amiodarone  200 mg Oral Daily  . carvedilol  50 mg Oral BID WC  . Chlorhexidine Gluconate Cloth  6 each Topical Once  . cinacalcet  30 mg Oral Q supper  . Darbepoetin Alfa      . darbepoetin (ARANESP) injection - DIALYSIS  60 mcg Intravenous Q Wed-HD  . docusate sodium  200 mg Oral BID  . nystatin  5 mL Oral QID  . sodium chloride  flush  3 mL Intravenous Q12H  . warfarin  2 mg Oral ONCE-1800  . Warfarin - Pharmacist Dosing Inpatient   Does not apply [phone]

## 2016-07-16 NOTE — Progress Notes (Signed)
Days Post-Op  Subjective: She looks fine says the full liquids make her vomit, but it doesn't sound like more than reflux. She had dialysis this AM and now reports blood in the stools she has had since her HD.  She is therapeutic on coumadin also.    Objective: Vital signs in last 24 hours: Temp:  [97.8 F (36.6 C)-98.2 F (36.8 C)] 97.9 F (36.6 C) (12/27 0556) Pulse Rate:  [62-65] 62 (12/27 0556) Resp:  [16-17] 17 (12/27 0556) BP: (120-143)/(48-52) 120/48 (12/27 0556) SpO2:  [81 %-97 %] 97 % (12/27 0604) Last BM Date: 07/15/16 460 PO Voided x 5 Nothing from wound vac Stool x 2 Afebrile, VSS WBC is normal and INR 2.59  Intake/Output from previous day: 12/26 0701 - 12/27 0700 In: 460 [P.O.:460] Out: 0  Intake/Output this shift: No intake/output data recorded.  General appearance: alert, cooperative and no distress GI: soft, non-tender; bowel sounds normal; no masses,  no organomegaly and incision vac in place and due to come off.  she has been in HD all day and has not been done.  Lab Results:   Recent Labs  07/13/16 0900 07/16/16 0446  WBC 11.6* 5.5  HGB 9.5* 9.0*  HCT 29.4* 28.1*  PLT 254 288    BMET  Recent Labs  07/13/16 0900  NA 131*  K 3.9  CL 96*  CO2 24  GLUCOSE 103*  BUN 13  CREATININE 6.32*  CALCIUM 7.5*   PT/INR  Recent Labs  07/15/16 0409 07/16/16 0446  LABPROT 26.5* 28.2*  INR 2.38 2.59     Recent Labs Lab 07/12/16 0900 07/13/16 0900  ALBUMIN 2.5* 2.6*     Lipase  No results found for: LIPASE   Studies/Results: Dg Abd 2 Views  Result Date: 07/14/2016 CLINICAL DATA:  Lower abdominal pain with nausea and vomiting. Recent sigmoid colectomy. EXAM: ABDOMEN - 2 VIEW COMPARISON:  CT abdomen and pelvis 07/09/2016 FINDINGS: Gas is present in multiple loops of mildly to moderately dilated small bowel in the left and right mid abdomen with multiple small air-fluid levels. The residual oral contrast is present in the proximal colon.  A small amount of gas is present throughout the colon. There is also contrast in the rectum. No definite intraperitoneal free air. Skin staples are noted over the lower abdomen. Prior lumbar fusion. IMPRESSION: Mild-to-moderate small bowel dilatation with small volume gas and contrast in the colon. Findings may reflect postoperative ileus, although partial small bowel obstruction is not excluded. Electronically Signed   By: Logan Bores M.D.   On: 07/14/2016 09:33    Medications: . amiodarone  200 mg Oral Daily  . carvedilol  50 mg Oral BID WC  . Chlorhexidine Gluconate Cloth  6 each Topical Once  . cinacalcet  30 mg Oral Q supper  . darbepoetin (ARANESP) injection - DIALYSIS  60 mcg Intravenous Q Wed-HD  . docusate sodium  200 mg Oral BID  . nystatin  5 mL Oral QID  . sodium chloride flush  3 mL Intravenous Q12H  . Warfarin - Pharmacist Dosing Inpatient   Does not apply q1800    Assessment/Plan Diverticulitis with perforation S/p laparoscopic-assisted sigmoid colectomy with colorectal anastomosis, mobilization of splenic flexure, 07/11/16, [name] (findings: diverticulitis with inflammatory adhesions to the uterus and abdominal wall)  POD 5 Some bleeding with BM this afternoon ESRD -HD - MWF PAF Hypertension Anemia FEN: No IV fluids/Clears with fluid restriction ID:  Antibiotics 12/06 -07/12/16; none since 07/12/16 DVT:  SCD/Warfarin  per pharmacy -  INR 2.59  Plan:  Dr. Tyrell Antonio is holding the coumadin and checking CBC.  If she has significant bleeding we will have to reverse her coumadin.  I will keep her on full liquids for now, and follow labs and progress.         LOS: 20 days    Sholonda Jobst 07/16/2016 [phone]

## 2016-07-16 NOTE — Progress Notes (Signed)
ANTICOAGULATION CONSULT NOTE - Follow Up Consult  Pharmacy Consult for Coumadin Indication: atrial fibrillation  Allergies  Allergen Reactions  . Tape Other (See Comments)    PLASTIC TAPE TEARS OFF THE SKIN AND BRUISES IT TERRIBLY!!  . Latex Rash    Patient Measurements: Height: 5\' 5"  (165.1 cm) Weight: 233 lb 4 oz (105.8 kg) IBW/kg (Calculated) : 57  Vital Signs: Temp: 98 F (36.7 C) (12/27 0715) Temp Source: Oral (12/27 0715) BP: 129/75 (12/27 1030) Pulse Rate: 63 (12/27 1030)  Assessment: 70 year old female  on Coumadin 5mg  daily exc 7.5mg  on Wed/Fri PTA for h/o Afib. INR was elevated and doses were held for OR. Now Coumadin restarted s/p OR. INR trending up quickly with only 3 lower doses so far. INR up to 2.59 today. Hgb 9.0, plts wnl. No s/s of bleed. Also on amio.  Goal of Therapy:  INR 2-3 Monitor platelets by anticoagulation protocol: Yes   Plan:  Give Coumadin 2mg  PO x 1 tonight Monitor daily INR, CBC, s/s of bleed  Elenor Quinones, PharmD, [facility] Clinical Pharmacist Pager [phone] 07/16/2016 10:51 AM

## 2016-07-16 NOTE — Progress Notes (Addendum)
PROGRESS NOTE  Jane [name] XTK:[id number] DOB: 07/30/1945 DOA: 06/25/2016 PCP: [place]   LOS: 20 days   Brief Narrative: 70 y.o.BF PMHx ESRD on HD M/W/F, A-Fib on warfarin, HTN, and anemiaWho presents with chest pain for 1 day and abdominal pain for 3 months. Regarding chest pain, this was a new complaint today. The patient went to HD, and during her session, developed a sharp, severe, constant pain under her right breast associated with shortness of breath and nausea. She finished her treatment, then the HD nurse administered O2 and nitroglycerin, which caused her pain to subside, and recommended she go to the ER.  Patient was initially placed on antibiotics with ciprofloxacin and metronidazole, she was very slow to improve then failed advancement of her diet, general surgery was consulted. Her antibodies were transitioned to Zosyn, and another attempt was made to advance her diet as she was appearing to improve clinically over the weekend. However when she got to soft diet her pain returned. She was put on clear liquid diet and repeat CT abd and pelvis ordered showed possible microperforation. She was taken to OR on 12/22 for lap sigmoid colectomy with colorectal anastomosis and mobilization of splenic flexure.   She was started on clear liquid diet and advanced to full liquid. Recommended to ambulate as much as she can.  Patient report bloody stool, bright red per rectum    Assessment & Plan: Principal Problem:   Diverticulitis of large intestine with perforation Active Problems:   ESRD on hemodialysis (HCC)   Paroxysmal atrial fibrillation (HCC)   Essential hypertension   Anemia in other chronic diseases classified elsewhere   Chest pain with moderate risk for cardiac etiology  Diverticulitis with perforation - repeat CT scan 12/20 shows  Severe sigmoid diverticulitis with similar contained microperforation, no drainable fluid collection. Considering eccentric  nature of the mural thickening, recommend follow-up endoscopy after resolution of acute symptoms to exclude mass - she was initially on Cipro flagyl , transitioned to zosyn and discontinued after surgery.  - though she remains afebrile without any leukocytosis, she has persistent abdominal pain , .  - she underwent lap sigmoid colectomy with colorectal anastomosis and splenic flexure mobilization on 12/22.   Started clears, continues to have nausea and vomiting on 12/24- 12/25. abd x ray shows dilated bowel loops, possibly post ileus vs SBO.   Lower GI bleed; might be related to bleeding from recent anastomosis. Surgery informed of change patient status.  Develops Bright red per rectum. Will hold Coumadin. Repeat Hb if trending down might need Blood transfusion or FFP.  If bleeding continue might need to be transfer to step down, discussed with nurse  Will give dose of vitamin K, repeat INR later.  Informed to renal change in patient condition.   ESRD on HD - Patient clinically continues to be euvolemic. - HD per nephrology, she is on MWF schedule- -had HD today   HTN - better controlled.  - continue Coreg   Paroxysmal atrial fibrillation - Currently in NSR - therapeutic INR. -hold coumadin, if bleeding continue and might need FFP , check hb,.   Hyponatremia: probably from ESRD.    Anemia of chronic Renal disease    DVT prophylaxis: Coumadin . Code Status: Full Family Communication: no family bedside, CALLED daughter and updated today.  Disposition Plan: home when ready   Consultants:   General surgery  Cardiology   Nephrology   Procedures:   None   Antimicrobials:  Zosyn 12/14 >>  Subjective: No vomiting and nausea improved.   Develops bright red per rectum, small blood clots   Objective: Vitals:   07/16/16 1100 07/16/16 1119 07/16/16 1200 07/16/16 1506  BP: 122/60 114/60 (!) 125/44 (!) 145/59  Pulse: (!) 58 60 70 81  Resp:  18  18  Temp:  97.7 F  (36.5 C) 97.7 F (36.5 C) 98.7 F (37.1 C)  TempSrc:  Oral Oral Oral  SpO2:  98% 100% 100%  Weight:  104.7 kg (230 lb 13.2 oz)    Height:        Intake/Output Summary (Last 24 hours) at 07/16/16 1540 Last data filed at 07/16/16 1119  Gross per 24 hour  Intake              340 ml  Output             1200 ml  Net             -860 ml   Filed Weights   07/13/16 0910 07/16/16 0715 07/16/16 1119  Weight: 107.1 kg (236 lb 1.8 oz) 105.8 kg (233 lb 4 oz) 104.7 kg (230 lb 13.2 oz)    Examination: Constitutional: NAD Vitals:   07/16/16 1100 07/16/16 1119 07/16/16 1200 07/16/16 1506  BP: 122/60 114/60 (!) 125/44 (!) 145/59  Pulse: (!) 58 60 70 81  Resp:  18  18  Temp:  97.7 F (36.5 C) 97.7 F (36.5 C) 98.7 F (37.1 C)  TempSrc:  Oral Oral Oral  SpO2:  98% 100% 100%  Weight:  104.7 kg (230 lb 13.2 oz)    Height:       Respiratory: clear to auscultation bilaterally, no wheezing, no crackles.  Cardiovascular: Regular rate and rhythm, no murmurs / rubs / gallops. No LE edema. Abdomen: persistent left lower quadrant pain,  Bowel sounds positive.  Musculoskeletal: no clubbing / cyanosis. Neuro: non focal, strength equal     Data Reviewed: I have personally reviewed following labs and imaging studies  CBC:  Recent Labs Lab 07/12/16 0450 07/12/16 0900 07/13/16 0900 07/16/16 0446 07/16/16 0739  WBC 8.6 9.8 11.6* 5.5 5.8  HGB 9.7* 9.7* 9.5* 9.0* 8.7*  HCT 30.4* 30.2* 29.4* 28.1* 27.1*  MCV 88.4 87.8 88.0 88.1 88.3  PLT 242 244 254 288 102   Basic Metabolic Panel:  Recent Labs Lab 07/11/16 1411 07/12/16 0450 07/12/16 0900 07/13/16 0900 07/16/16 0739  NA 130* 132* 133* 131* 134*  K 3.9 3.9 4.0 3.9 3.7  CL 96* 96* 96* 96* 98*  CO2 24 23 23 24 25   GLUCOSE 75 77 73 103* 82  BUN 15 17 19 13  35*  CREATININE 6.90* 7.94* 8.23* 6.32* 8.75*  CALCIUM 7.1* 7.0* 7.0* 7.5* 7.9*  MG  --  2.0  --   --   --   PHOS  --   --  6.0* 4.1 4.4   GFR: Estimated Creatinine  Clearance: 7 mL/min (by C-G formula based on SCr of 8.75 mg/dL (H)). Liver Function Tests:  Recent Labs Lab 07/12/16 0900 07/13/16 0900 07/16/16 0739  ALBUMIN 2.5* 2.6* 2.5*   No results for input(s): LIPASE, AMYLASE in the last 168 hours. No results for input(s): AMMONIA in the last 168 hours. Coagulation Profile:  Recent Labs Lab 07/12/16 0450 07/13/16 0432 07/14/16 0401 07/15/16 0409 07/16/16 0446  INR 1.72 1.88 1.89 2.38 2.59   Cardiac Enzymes: No results for input(s): CKTOTAL, CKMB, CKMBINDEX, TROPONINI in the last 168 hours. BNP (last 3 results) No  results for input(s): PROBNP in the last 8760 hours. HbA1C: No results for input(s): HGBA1C in the last 72 hours. CBG: No results for input(s): GLUCAP in the last 168 hours. Lipid Profile: No results for input(s): CHOL, HDL, LDLCALC, TRIG, CHOLHDL, LDLDIRECT in the last 72 hours. Thyroid Function Tests: No results for input(s): TSH, T4TOTAL, FREET4, T3FREE, THYROIDAB in the last 72 hours. Anemia Panel: No results for input(s): VITAMINB12, FOLATE, FERRITIN, TIBC, IRON, RETICCTPCT in the last 72 hours. Urine analysis:    Component Value Date/Time   COLORURINE AMBER (A) 06/30/2016 0503   APPEARANCEUR HAZY (A) 06/30/2016 0503   LABSPEC 1.020 06/30/2016 0503   PHURINE 5.0 06/30/2016 0503   GLUCOSEU NEGATIVE 06/30/2016 0503   HGBUR NEGATIVE 06/30/2016 0503   BILIRUBINUR SMALL (A) 06/30/2016 0503   KETONESUR NEGATIVE 06/30/2016 0503   PROTEINUR 30 (A) 06/30/2016 0503   UROBILINOGEN 0.2 06/30/2012 2201   NITRITE NEGATIVE 06/30/2016 0503   LEUKOCYTESUR SMALL (A) 06/30/2016 0503   Sepsis Labs: Invalid input(s): PROCALCITONIN, LACTICIDVEN  No results found for this or any previous visit (from the past 240 hour(s)).  Radiology Studies: No results found.  Scheduled Meds: . amiodarone  200 mg Oral Daily  . carvedilol  50 mg Oral BID WC  . Chlorhexidine Gluconate Cloth  6 each Topical Once  . cinacalcet  30 mg Oral Q  supper  . darbepoetin (ARANESP) injection - DIALYSIS  60 mcg Intravenous Q Wed-HD  . docusate sodium  200 mg Oral BID  . nystatin  5 mL Oral QID  . pantoprazole (PROTONIX) IV  40 mg Intravenous Q12H  . sodium chloride flush  3 mL Intravenous Q12H  . Warfarin - Pharmacist Dosing Inpatient   Does not apply q1800   Continuous Infusions:   Niel Hummer , MD,  [facility] Pager [phone]  If 7PM-7AM, please contact night-coverage [email or web address] Password [facility] 07/16/2016, 3:40 PM

## 2016-07-17 LAB — CBC
HCT: 25.8 % — ABNORMAL LOW (ref 36.0–46.0)
Hemoglobin: 8.3 g/dL — ABNORMAL LOW (ref 12.0–15.0)
MCH: 28.5 pg (ref 26.0–34.0)
MCHC: 32.2 g/dL (ref 30.0–36.0)
MCV: 88.7 fL (ref 78.0–100.0)
PLATELETS: 251 10*3/uL (ref 150–400)
RBC: 2.91 MIL/uL — AB (ref 3.87–5.11)
RDW: 17.4 % — ABNORMAL HIGH (ref 11.5–15.5)
WBC: 6.6 10*3/uL (ref 4.0–10.5)

## 2016-07-17 LAB — HEMOGLOBIN AND HEMATOCRIT, BLOOD
HCT: 27.3 % — ABNORMAL LOW (ref 36.0–46.0)
HCT: 26.8 % — ABNORMAL LOW (ref 36.0–46.0)
HEMATOCRIT: 25.4 % — AB (ref 36.0–46.0)
HEMOGLOBIN: 8.1 g/dL — AB (ref 12.0–15.0)
Hemoglobin: 8.8 g/dL — ABNORMAL LOW (ref 12.0–15.0)
Hemoglobin: 8.4 g/dL — ABNORMAL LOW (ref 12.0–15.0)

## 2016-07-17 LAB — FERRITIN: FERRITIN: 1126 ng/mL — AB (ref 11–307)

## 2016-07-17 LAB — IRON AND TIBC
IRON: 25 ug/dL — AB (ref 28–170)
SATURATION RATIOS: 16 % (ref 10.4–31.8)
TIBC: 157 ug/dL — ABNORMAL LOW (ref 250–450)
UIBC: 132 ug/dL

## 2016-07-17 LAB — PROTIME-INR
INR: 3.17
INR: 2.69
Prothrombin Time: 29.2 seconds — ABNORMAL HIGH (ref 11.4–15.2)
Prothrombin Time: 33.2 seconds — ABNORMAL HIGH (ref 11.4–15.2)

## 2016-07-17 MED ORDER — SODIUM CHLORIDE 0.9 % IV SOLN
510.0000 mg | Freq: Once | INTRAVENOUS | Status: AC
  Administered 2016-07-17: 510 mg via INTRAVENOUS
  Filled 2016-07-17: qty 17

## 2016-07-17 MED ORDER — VITAMIN K1 10 MG/ML IJ SOLN
2.0000 mg | Freq: Once | INTRAVENOUS | Status: AC
  Administered 2016-07-17: 2 mg via INTRAVENOUS
  Filled 2016-07-17: qty 0.2

## 2016-07-17 MED ORDER — PRO-STAT SUGAR FREE PO LIQD
30.0000 mL | Freq: Two times a day (BID) | ORAL | Status: DC
  Administered 2016-07-17 (×2): 30 mL via ORAL
  Filled 2016-07-17 (×3): qty 30

## 2016-07-17 NOTE — Progress Notes (Signed)
0058 Hg result of 8.8 relayed to Tylene Fantasia as per Md's order.

## 2016-07-17 NOTE — Progress Notes (Signed)
ANTICOAGULATION CONSULT NOTE - Follow Up Consult  Pharmacy Consult for Coumadin Indication: atrial fibrillation  Allergies  Allergen Reactions  . Tape Other (See Comments)    PLASTIC TAPE TEARS OFF THE SKIN AND BRUISES IT TERRIBLY!!  . Latex Rash    Patient Measurements: Height: 5\' 5"  (165.1 cm) Weight: 230 lb 13.2 oz (104.7 kg) IBW/kg (Calculated) : 57  Vital Signs: Temp: 98.5 F (36.9 C) (12/28 0620) Temp Source: Oral (12/28 0620) BP: 128/44 (12/28 0620) Pulse Rate: 62 (12/28 2574)  Assessment: 70 year old female  on Coumadin 5mg  daily exc 7.5mg  on Wed/Fri PTA for h/o Afib. INR was elevated and doses were held for OR. Now Coumadin restarted s/p OR. INR trending up quickly with only 3 lower doses so far. INR up to 3.17 today. Noticed blood with BM yesterday, dose not given. Hgb 8.3, plts wnl. No s/s of bleed. Also on amio.  Goal of Therapy:  INR 2-3 Monitor platelets by anticoagulation protocol: Yes   Plan:  Hold Coumadin tonight Monitor daily INR, CBC, s/s of bleed  Elenor Quinones, PharmD, [facility] Clinical Pharmacist Pager [phone] 07/17/2016 12:38 PM

## 2016-07-17 NOTE — Progress Notes (Signed)
6 Days Post-Op  Subjective: Pt doing well, incision looks fine with incisional vac off.  No complaints of nausea or vomiting this AM.  She did not notice blood with her last BM.  Objective: Vital signs in last 24 hours: Temp:  [97.7 F (36.5 C)-98.7 F (37.1 C)] 98.5 F (36.9 C) (12/28 0620) Pulse Rate:  [58-81] 62 (12/28 0620) Resp:  [16-18] 16 (12/28 0620) BP: (90-145)/(35-75) 128/44 (12/28 0620) SpO2:  [93 %-100 %] 95 % (12/28 0620) Weight:  [104.7 kg (230 lb 13.2 oz)] 104.7 kg (230 lb 13.2 oz) (12/27 1119) Last BM Date: 07/16/16 240 PO recorded 1200 out on HD BM x 4 recorded Afebrile, VSS H/H 9.5/29.4 - 07/13/16 =>> 8.3/25.8 this AM INR up to 3.17 Intake/Output from previous day: 12/27 0701 - 12/28 0700 In: 240 [P.O.:240] Out: 1200  Intake/Output this shift: No intake/output data recorded.  General appearance: alert, cooperative and no distress GI: soft, sore, site looks fine, + BM, no blood noted with the last BM  Lab Results:   Recent Labs  07/16/16 0739  07/17/16 0053 07/17/16 0443  WBC 5.8  --   --  6.6  HGB 8.7*  < > 8.8* 8.3*  HCT 27.1*  < > 27.3* 25.8*  PLT 283  --   --  251  < > = values in this interval not displayed.  BMET  Recent Labs  07/16/16 0739  NA 134*  K 3.7  CL 98*  CO2 25  GLUCOSE 82  BUN 35*  CREATININE 8.75*  CALCIUM 7.9*   PT/INR  Recent Labs  07/17/16 0053 07/17/16 0443  LABPROT 29.2* 33.2*  INR 2.69 3.17     Recent Labs Lab 07/12/16 0900 07/13/16 0900 07/16/16 0739  ALBUMIN 2.5* 2.6* 2.5*     Lipase  No results found for: LIPASE   Studies/Results: No results found.  Medications: . amiodarone  200 mg Oral Daily  . carvedilol  50 mg Oral BID WC  . Chlorhexidine Gluconate Cloth  6 each Topical Once  . cinacalcet  30 mg Oral Q supper  . darbepoetin (ARANESP) injection - DIALYSIS  60 mcg Intravenous Q Wed-HD  . docusate sodium  200 mg Oral BID  . nystatin  5 mL Oral QID  . pantoprazole (PROTONIX) IV   40 mg Intravenous Q12H  . phytonadione (VITAMIN K) IV  2 mg Intravenous Once  . sodium chloride flush  3 mL Intravenous Q12H    Assessment/Plan Diverticulitis with perforation S/p laparoscopic-assisted sigmoid colectomy with colorectal anastomosis, mobilization of splenic flexure, 07/11/16, Jane [name] (findings: diverticulitis with inflammatory adhesions to the uterus and abdominal wall)  POD 5 Some bleeding with BM 07/16/16 - after dialysis ESRD -HD - MWF PAF Hypertension Anemia FEN: No IV fluids/full liquids ID: Antibiotics 12/06 -07/12/16; none since 07/12/16 DVT: SCD/Warfarin per pharmacy - INR 3.17    Plan:  Soft renal diet, watch H/H, anticoagulation per Medicine.   LOS: 21 days    Jane [name] 07/17/2016 [phone]

## 2016-07-17 NOTE — Progress Notes (Signed)
  [facility] Progress Note   Subjective:  Reports that she has noticed rectal bleeding again. Primary team aware. Given Vit K twice and coumadin held. Hgb down to 8.3. No CP or abdominal pain currently. No dyspnea.  Objective Vitals:   07/16/16 1506 07/16/16 2203 07/16/16 2242 07/17/16 0620  BP: (!) 145/59 (!) 96/47 (!) 105/43 (!) 128/44  Pulse: 81 61  62  Resp: 18 16  16   Temp: 98.7 F (37.1 C) 98.3 F (36.8 C)  98.5 F (36.9 C)  TempSrc: Oral Oral  Oral  SpO2: 100% 93%  95%  Weight:      Height:       Physical Exam General: Pleasant female, NAD Heart: RRR, 2/6 systolic murmur  Lungs: CTAB  Abdomen: soft, non-tender Extremities:  No LE edema Dialysis Access: R AVF + thrill  Additional Objective Labs: Basic Metabolic Panel:  Recent Labs Lab 07/12/16 0900 07/13/16 0900 07/16/16 0739  NA 133* 131* 134*  K 4.0 3.9 3.7  CL 96* 96* 98*  CO2 23 24 25   GLUCOSE 73 103* 82  BUN 19 13 35*  CREATININE 8.23* 6.32* 8.75*  CALCIUM 7.0* 7.5* 7.9*  PHOS 6.0* 4.1 4.4   Liver Function Tests:  Recent Labs Lab 07/12/16 0900 07/13/16 0900 07/16/16 0739  ALBUMIN 2.5* 2.6* 2.5*   CBC:  Recent Labs Lab 07/12/16 0900 07/13/16 0900 07/16/16 0446 07/16/16 0739 07/16/16 1417 07/17/16 0053 07/17/16 0443  WBC 9.8 11.6* 5.5 5.8  --   --  6.6  HGB 9.7* 9.5* 9.0* 8.7* 9.1* 8.8* 8.3*  HCT 30.2* 29.4* 28.1* 27.1* 28.4* 27.3* 25.8*  MCV 87.8 88.0 88.1 88.3  --   --  88.7  PLT 244 254 288 283  --   --  251   Medications:  . amiodarone  200 mg Oral Daily  . carvedilol  50 mg Oral BID WC  . Chlorhexidine Gluconate Cloth  6 each Topical Once  . cinacalcet  30 mg Oral Q supper  . darbepoetin (ARANESP) injection - DIALYSIS  60 mcg Intravenous Q Wed-HD  . docusate sodium  200 mg Oral BID  . nystatin  5 mL Oral QID  . pantoprazole (PROTONIX) IV  40 mg Intravenous Q12H  . sodium chloride flush  3 mL Intravenous Q12H    Dialysis Orders: [facility] MWF 3h 10min  111.5kg 2/2 bath Hep 2000 RFA AVF - Hectorol 3 ug / hd - Mircera 50 q 2wks, last 12/6  Assessment/Plan: 1.  Acute diverticulitis with microperforation: s/p sigmoid colectomy w/ re-anastomosis 12/22. Now w rectal bleeding again. Per primary team. 2. ESRD: WIll continue HD per MWF schedule, next 12/29. No heparin with HD. 3. HTN: good control on Coreg 50 bid, vol down 6kg since admit. 4.  Atypical CP: trops neg, no further w/u per cards 5.  Hx Afib: S/p DCCV 6 yrs ago ([facility]), on amio/ coreg; warfarin held currently. 6.  Nutrition: Albumin 2.5. Added pro-stat supplements. 7.  Anemia: Hgb down to 8.3 with recurrent bleeding. Aranesp 59mcg given 12/27. 8.  Sec HPTH: Continue sensipar. VDRA not given here. Corr Ca, Phos ok.  Veneta Penton, PA-C 07/17/2016, 11:47 AM  [facility] Pager: [phone]  Pt seen, examined and agree w A/P as above.  Kelly Splinter MD [employer] pager [phone]   07/17/2016, 1:37 PM

## 2016-07-17 NOTE — Progress Notes (Addendum)
Physical Therapy Treatment Patient Details Name: Jane [name] MRN: [id number] DOB: 01/26/1946 Today's Date: 07/17/2016    History of Present Illness Admitted 06/25/16 and underwent LAPAROSCOPIC SIGMOID COLECTOMY on 07/11/16.  PMH significant for : ESRD, HTN, A-fib.       PT Comments    Patient tolerated short gait distance and c/o dizziness and fatigue. Soft BP (see below). Continue to progress as tolerated with anticipated d/c home with HHPT.   Follow Up Recommendations  Home health PT;Supervision for mobility/OOB     Equipment Recommendations  Rolling walker with 5" wheels    Recommendations for Other Services       Precautions / Restrictions Precautions Precautions: Fall Restrictions Weight Bearing Restrictions: No    Mobility  Bed Mobility Overal bed mobility: Modified Independent             General bed mobility comments: increased time and effort  Transfers Overall transfer level: Needs assistance Equipment used: Rolling walker (2 wheeled) Transfers: Sit to/from Stand Sit to Stand: Supervision         General transfer comment: supervision for safety; cues for hand placement  Ambulation/Gait Ambulation/Gait assistance: Min assist Ambulation Distance (Feet): 12 Feet Assistive device: Rolling walker (2 wheeled) Gait Pattern/deviations: Step-through pattern;Decreased stride length     General Gait Details: cues for posture and proximity of RW; pt limited by c/o dizziness and began leaning on RW; cues for safe use of RW and to return to EOB; BP taken in sitting 109/47 and in standing 96/42; other VSS   Stairs            Wheelchair Mobility    Modified Rankin (Stroke Patients Only)       Balance                                    Cognition Arousal/Alertness: Awake/alert Behavior During Therapy: WFL for tasks assessed/performed Overall Cognitive Status: Within Functional Limits for tasks assessed                       Exercises      General Comments        Pertinent Vitals/Pain Pain Assessment: No/denies pain    Home Living                      Prior Function            PT Goals (current goals can now be found in the care plan section) Acute Rehab PT Goals Patient Stated Goal: to go home Progress towards PT goals: Progressing toward goals    Frequency    Min 3X/week      PT Plan Current plan remains appropriate    Co-evaluation             End of Session Equipment Utilized During Treatment: Gait belt Activity Tolerance: Patient limited by fatigue;Other (comment) (soft BP) Patient left: with call bell/phone within reach;in bed     Time: 0277-4128 PT Time Calculation (min) (ACUTE ONLY): 29 min  Charges:  $Gait Training: 8-22 mins $Therapeutic Activity: 8-22 mins                    G Codes:      Salina April, PTA Pager: [phone]   07/17/2016, 4:09 PM

## 2016-07-17 NOTE — Progress Notes (Signed)
PROGRESS NOTE  Jane [name] DHR:[id number] DOB: 1946/03/15 DOA: 06/25/2016 PCP: [place]   LOS: 21 days   Brief Narrative: 70 y.o.BF PMHx ESRD on HD M/W/F, A-Fib on warfarin, HTN, and anemiaWho presents with chest pain for 1 day and abdominal pain for 3 months. Regarding chest pain, this was a new complaint today. The patient went to HD, and during her session, developed a sharp, severe, constant pain under her right breast associated with shortness of breath and nausea. She finished her treatment, then the HD nurse administered O2 and nitroglycerin, which caused her pain to subside, and recommended she go to the ER.  Patient was initially placed on antibiotics with ciprofloxacin and metronidazole, she was very slow to improve then failed advancement of her diet, general surgery was consulted. Her antibodies were transitioned to Zosyn, and another attempt was made to advance her diet as she was appearing to improve clinically over the weekend. However when she got to soft diet her pain returned. She was put on clear liquid diet and repeat CT abd and pelvis ordered showed possible microperforation. She was taken to OR on 12/22 for lap sigmoid colectomy with colorectal anastomosis and mobilization of splenic flexure.   She was started on clear liquid diet and advanced to full liquid. Recommended to ambulate as much as she can.  Patient report bloody stool, bright red per rectum    Assessment & Plan: Principal Problem:   Diverticulitis of large intestine with perforation Active Problems:   ESRD on hemodialysis (HCC)   Paroxysmal atrial fibrillation (HCC)   Essential hypertension   Anemia in other chronic diseases classified elsewhere   Chest pain with moderate risk for cardiac etiology  Diverticulitis with perforation - repeat CT scan 12/20 shows  Severe sigmoid diverticulitis with similar contained microperforation, no drainable fluid collection. Considering eccentric  nature of the mural thickening, recommend follow-up endoscopy after resolution of acute symptoms to exclude mass - she was initially on Cipro flagyl , transitioned to zosyn and discontinued after surgery.  - though she remains afebrile without any leukocytosis, she has persistent abdominal pain , .  - she underwent lap sigmoid colectomy with colorectal anastomosis and splenic flexure mobilization on 12/22.   Started clears, continues to have nausea and vomiting on 12/24- 12/25. abd x ray shows dilated bowel loops, possibly post ileus vs SBO.   Lower GI bleed; might be related to bleeding from recent anastomosis. Surgery following.  Develops Bright red per rectum.  Denies blood in the stool this am.  Hb stable at 8.4  Received one dose of vitamin K. If hb drops will need blood transfusion.  Iron deficiency. Will order IV iron.   ESRD on HD - Patient clinically continues to be euvolemic. - HD per nephrology, she is on MWF schedule- -had HD 12-27  HTN - better controlled.  - continue Coreg   Paroxysmal atrial fibrillation - Currently in NSR - therapeutic INR. -hold coumadin Will give another dose of vitamin K.   Hyponatremia: probably from ESRD.    Anemia of chronic Renal disease    DVT prophylaxis: Coumadin . Code Status: Full Family Communication: no family bedside.  Disposition Plan: home when ready   Consultants:   General surgery  Cardiology   Nephrology   Procedures:   None   Antimicrobials:  Zosyn 12/14 >>   Subjective: Denies blood in the stool   Objective: Vitals:   07/16/16 2203 07/16/16 2242 07/17/16 0620 07/17/16 1500  BP: (!) 96/47 Marland Kitchen)  105/43 (!) 128/44 (!) 109/47  Pulse: 61  62 63  Resp: 16  16 16   Temp: 98.3 F (36.8 C)  98.5 F (36.9 C) 98.5 F (36.9 C)  TempSrc: Oral  Oral Oral  SpO2: 93%  95% 97%  Weight:      Height:        Intake/Output Summary (Last 24 hours) at 07/17/16 1649 Last data filed at 07/17/16 1515  Gross per  24 hour  Intake              820 ml  Output              200 ml  Net              620 ml   Filed Weights   07/13/16 0910 07/16/16 0715 07/16/16 1119  Weight: 107.1 kg (236 lb 1.8 oz) 105.8 kg (233 lb 4 oz) 104.7 kg (230 lb 13.2 oz)    Examination: Constitutional: NAD Vitals:   07/16/16 2203 07/16/16 2242 07/17/16 0620 07/17/16 1500  BP: (!) 96/47 (!) 105/43 (!) 128/44 (!) 109/47  Pulse: 61  62 63  Resp: 16  16 16   Temp: 98.3 F (36.8 C)  98.5 F (36.9 C) 98.5 F (36.9 C)  TempSrc: Oral  Oral Oral  SpO2: 93%  95% 97%  Weight:      Height:       Respiratory: clear to auscultation bilaterally, no wheezing, no crackles.  Cardiovascular: Regular rate and rhythm, no murmurs / rubs / gallops. No LE edema. Abdomen: persistent left lower quadrant pain,  Bowel sounds positive.  Musculoskeletal: no clubbing / cyanosis. Neuro: non focal, strength equal     Data Reviewed: I have personally reviewed following labs and imaging studies  CBC:  Recent Labs Lab 07/12/16 0900 07/13/16 0900 07/16/16 0446 07/16/16 0739 07/16/16 1417 07/17/16 0053 07/17/16 0443 07/17/16 1441  WBC 9.8 11.6* 5.5 5.8  --   --  6.6  --   HGB 9.7* 9.5* 9.0* 8.7* 9.1* 8.8* 8.3* 8.4*  HCT 30.2* 29.4* 28.1* 27.1* 28.4* 27.3* 25.8* 26.8*  MCV 87.8 88.0 88.1 88.3  --   --  88.7  --   PLT 244 254 288 283  --   --  251  --    Basic Metabolic Panel:  Recent Labs Lab 07/11/16 1411 07/12/16 0450 07/12/16 0900 07/13/16 0900 07/16/16 0739  NA 130* 132* 133* 131* 134*  K 3.9 3.9 4.0 3.9 3.7  CL 96* 96* 96* 96* 98*  CO2 24 23 23 24 25   GLUCOSE 75 77 73 103* 82  BUN 15 17 19 13  35*  CREATININE 6.90* 7.94* 8.23* 6.32* 8.75*  CALCIUM 7.1* 7.0* 7.0* 7.5* 7.9*  MG  --  2.0  --   --   --   PHOS  --   --  6.0* 4.1 4.4   GFR: Estimated Creatinine Clearance: 7 mL/min (by C-G formula based on SCr of 8.75 mg/dL (H)). Liver Function Tests:  Recent Labs Lab 07/12/16 0900 07/13/16 0900 07/16/16 0739    ALBUMIN 2.5* 2.6* 2.5*   No results for input(s): LIPASE, AMYLASE in the last 168 hours. No results for input(s): AMMONIA in the last 168 hours. Coagulation Profile:  Recent Labs Lab 07/14/16 0401 07/15/16 0409 07/16/16 0446 07/17/16 0053 07/17/16 0443  INR 1.89 2.38 2.59 2.69 3.17   Cardiac Enzymes: No results for input(s): CKTOTAL, CKMB, CKMBINDEX, TROPONINI in the last 168 hours. BNP (last 3 results) No results  for input(s): PROBNP in the last 8760 hours. HbA1C: No results for input(s): HGBA1C in the last 72 hours. CBG: No results for input(s): GLUCAP in the last 168 hours. Lipid Profile: No results for input(s): CHOL, HDL, LDLCALC, TRIG, CHOLHDL, LDLDIRECT in the last 72 hours. Thyroid Function Tests: No results for input(s): TSH, T4TOTAL, FREET4, T3FREE, THYROIDAB in the last 72 hours. Anemia Panel:  Recent Labs  07/17/16 1056  FERRITIN 1,126*  TIBC 157*  IRON 25*   Urine analysis:    Component Value Date/Time   COLORURINE AMBER (A) 06/30/2016 0503   APPEARANCEUR HAZY (A) 06/30/2016 0503   LABSPEC 1.020 06/30/2016 0503   PHURINE 5.0 06/30/2016 0503   GLUCOSEU NEGATIVE 06/30/2016 0503   HGBUR NEGATIVE 06/30/2016 0503   BILIRUBINUR SMALL (A) 06/30/2016 0503   KETONESUR NEGATIVE 06/30/2016 0503   PROTEINUR 30 (A) 06/30/2016 0503   UROBILINOGEN 0.2 06/30/2012 2201   NITRITE NEGATIVE 06/30/2016 0503   LEUKOCYTESUR SMALL (A) 06/30/2016 0503   Sepsis Labs: Invalid input(s): PROCALCITONIN, LACTICIDVEN  No results found for this or any previous visit (from the past 240 hour(s)).  Radiology Studies: No results found.  Scheduled Meds: . amiodarone  200 mg Oral Daily  . carvedilol  50 mg Oral BID WC  . Chlorhexidine Gluconate Cloth  6 each Topical Once  . cinacalcet  30 mg Oral Q supper  . darbepoetin (ARANESP) injection - DIALYSIS  60 mcg Intravenous Q Wed-HD  . docusate sodium  200 mg Oral BID  . feeding supplement (PRO-STAT SUGAR FREE 64)  30 mL Oral  BID  . ferumoxytol  510 mg Intravenous Once  . nystatin  5 mL Oral QID  . pantoprazole (PROTONIX) IV  40 mg Intravenous Q12H  . sodium chloride flush  3 mL Intravenous Q12H   Continuous Infusions:   Niel Hummer , MD,  [facility] Pager [phone]  If 7PM-7AM, please contact night-coverage [email or web address] Password [facility] 07/17/2016, 4:49 PM

## 2016-07-18 ENCOUNTER — Telehealth: Payer: Self-pay | Admitting: General Surgery

## 2016-07-18 LAB — PROTIME-INR
INR: 1.23
PROTHROMBIN TIME: 15.6 s — AB (ref 11.4–15.2)

## 2016-07-18 LAB — CBC
HCT: 25.9 % — ABNORMAL LOW (ref 36.0–46.0)
Hemoglobin: 8.4 g/dL — ABNORMAL LOW (ref 12.0–15.0)
MCH: 28.8 pg (ref 26.0–34.0)
MCHC: 32.4 g/dL (ref 30.0–36.0)
MCV: 88.7 fL (ref 78.0–100.0)
PLATELETS: 264 10*3/uL (ref 150–400)
RBC: 2.92 MIL/uL — AB (ref 3.87–5.11)
RDW: 17.6 % — AB (ref 11.5–15.5)
WBC: 6.5 10*3/uL (ref 4.0–10.5)

## 2016-07-18 LAB — RENAL FUNCTION PANEL
Albumin: 2.5 g/dL — ABNORMAL LOW (ref 3.5–5.0)
Anion gap: 12 (ref 5–15)
BUN: 17 mg/dL (ref 6–20)
CALCIUM: 7.7 mg/dL — AB (ref 8.9–10.3)
CO2: 23 mmol/L (ref 22–32)
CREATININE: 7.59 mg/dL — AB (ref 0.44–1.00)
Chloride: 98 mmol/L — ABNORMAL LOW (ref 101–111)
GFR calc non Af Amer: 5 mL/min — ABNORMAL LOW (ref >60)
GFR, EST AFRICAN AMERICAN: 5 mL/min — AB (ref >60)
Glucose, Bld: 91 mg/dL (ref 65–99)
Phosphorus: 3.3 mg/dL (ref 2.5–4.6)
Potassium: 3.2 mmol/L — ABNORMAL LOW (ref 3.5–5.1)
SODIUM: 133 mmol/L — AB (ref 135–145)

## 2016-07-18 MED ORDER — PRO-STAT SUGAR FREE PO LIQD: 30.0000 mL | Freq: Two times a day (BID) | ORAL | 0 refills | Status: DC

## 2016-07-18 MED ORDER — WARFARIN SODIUM 5 MG PO TABS: 2.5000 mg | ORAL_TABLET | Freq: Every day | ORAL | Status: DC

## 2016-07-18 MED ORDER — WARFARIN SODIUM 2.5 MG PO TABS: 2.5000 mg | ORAL_TABLET | Freq: Every day | ORAL | 0 refills | Status: DC

## 2016-07-18 MED ORDER — WARFARIN - PHARMACIST DOSING INPATIENT
Freq: Every day | Status: DC
Start: 2016-07-18 — End: 2016-07-18

## 2016-07-18 NOTE — Progress Notes (Signed)
  [facility] Progress Note   Subjective:  No further rectal bleeding.   Objective Vitals:   07/18/16 0930 07/18/16 1000 07/18/16 1030 07/18/16 1100  BP: (!) 120/48 (!) 122/45 (!) 99/45 (!) 110/45  Pulse: 61 65 65 65  Resp:      Temp:      TempSrc:      SpO2:      Weight:      Height:       Physical Exam General: Pleasant female, NAD Heart: RRR, 2/6 systolic murmur  Lungs: CTAB  Abdomen: soft, non-tender Extremities:  No LE edema Dialysis Access: R AVF + thrill  Additional Objective Labs: Basic Metabolic Panel:  Recent Labs Lab 07/13/16 0900 07/16/16 0739 07/18/16 0730  NA 131* 134* 133*  K 3.9 3.7 3.2*  CL 96* 98* 98*  CO2 24 25 23   GLUCOSE 103* 82 91  BUN 13 35* 17  CREATININE 6.32* 8.75* 7.59*  CALCIUM 7.5* 7.9* 7.7*  PHOS 4.1 4.4 3.3   Liver Function Tests:  Recent Labs Lab 07/13/16 0900 07/16/16 0739 07/18/16 0730  ALBUMIN 2.6* 2.5* 2.5*   CBC:  Recent Labs Lab 07/13/16 0900 07/16/16 0446 07/16/16 0739  07/17/16 0443 07/17/16 1441 07/17/16 2226 07/18/16 0730  WBC 11.6* 5.5 5.8  --  6.6  --   --  6.5  HGB 9.5* 9.0* 8.7*  < > 8.3* 8.4* 8.1* 8.4*  HCT 29.4* 28.1* 27.1*  < > 25.8* 26.8* 25.4* 25.9*  MCV 88.0 88.1 88.3  --  88.7  --   --  88.7  PLT 254 288 283  --  251  --   --  264  < > = values in this interval not displayed. Medications:  . amiodarone  200 mg Oral Daily  . carvedilol  50 mg Oral BID WC  . Chlorhexidine Gluconate Cloth  6 each Topical Once  . cinacalcet  30 mg Oral Q supper  . darbepoetin (ARANESP) injection - DIALYSIS  60 mcg Intravenous Q Wed-HD  . docusate sodium  200 mg Oral BID  . feeding supplement (PRO-STAT SUGAR FREE 64)  30 mL Oral BID  . nystatin  5 mL Oral QID  . pantoprazole (PROTONIX) IV  40 mg Intravenous Q12H  . sodium chloride flush  3 mL Intravenous Q12H    Dialysis Orders: [facility] MWF 3h 64min 111.5kg 2/2 bath Hep 2000 RFA AVF - Hectorol 3 ug / hd - Mircera 50 q 2wks, last  12/6  Assessment: 1.  Acute diverticulitis with microperforation: s/p sigmoid colectomy w/ re-anastomosis 12/22 2.  Rectal bleeding: has stopped, is on coumadin with therapeutic INR.  Will need tight monitoring of INR per gen surgery when dc'd.   3. ESRD: HD MWF.  4. HTN: good control on Coreg 50 bid, vol down 5kg since admit. 5   Hx Afib: S/p DCCV 6 yrs ago ([facility]), on amio/ coreg 6.  Nutrition: Albumin 2.5. Added pro-stat supplements. 7.  Anemia: Hgb down to 8.3 with recurrent bleeding. Aranesp 69mcg given 12/27. 8.  Sec HPTH: Continue sensipar. VDRA not given here. Corr Ca, Phos ok.  Plan - HD today, UF 2kg, lower dry wt at dc. Possible dc today.    Kelly Splinter MD [employer] pager [phone]   07/18/2016, 11:34 AM

## 2016-07-18 NOTE — Progress Notes (Signed)
7 Days Post-Op  Subjective: No further blood in her stool, tolerating soft diet, + BM.  She does have some separation of the skin lower abdominal incision.  Some of the staples are just in on 1 side of the wound not both.  No drainage and it is under her panus so it makes it hard to watch and keep dry.    Objective: Vital signs in last 24 hours: Temp:  [98 F (36.7 C)-98.5 F (36.9 C)] 98 F (36.7 C) (12/29 0700) Pulse Rate:  [60-63] 60 (12/29 0700) Resp:  [16] 16 (12/29 0700) BP: (103-110)/(40-47) 103/41 (12/29 0700) SpO2:  [97 %-100 %] 98 % (12/29 0700) Last BM Date: 07/16/16  Intake/Output from previous day: 12/28 0701 - 12/29 0700 In: 1050 [P.O.:920; I.V.:120] Out: 200 [Urine:200] Intake/Output this shift: No intake/output data recorded.  General appearance: alert, cooperative and no distress GI: soft, non tender, sitll sore, incision has some staples that have come our partially under the panus, some minor seperation.  + BM without blood noted.    Lab Results:   Recent Labs  07/16/16 0739  07/17/16 0443 07/17/16 1441 07/17/16 2226  WBC 5.8  --  6.6  --   --   HGB 8.7*  < > 8.3* 8.4* 8.1*  HCT 27.1*  < > 25.8* 26.8* 25.4*  PLT 283  --  251  --   --   < > = values in this interval not displayed.  BMET  Recent Labs  07/16/16 0739  NA 134*  K 3.7  CL 98*  CO2 25  GLUCOSE 82  BUN 35*  CREATININE 8.75*  CALCIUM 7.9*   PT/INR  Recent Labs  07/17/16 0053 07/17/16 0443  LABPROT 29.2* 33.2*  INR 2.69 3.17     Recent Labs Lab 07/12/16 0900 07/13/16 0900 07/16/16 0739  ALBUMIN 2.5* 2.6* 2.5*     Lipase  No results found for: LIPASE   Studies/Results: No results found.  Medications: . amiodarone  200 mg Oral Daily  . carvedilol  50 mg Oral BID WC  . Chlorhexidine Gluconate Cloth  6 each Topical Once  . cinacalcet  30 mg Oral Q supper  . darbepoetin (ARANESP) injection - DIALYSIS  60 mcg Intravenous Q Wed-HD  . docusate sodium  200 mg Oral  BID  . feeding supplement (PRO-STAT SUGAR FREE 64)  30 mL Oral BID  . nystatin  5 mL Oral QID  . pantoprazole (PROTONIX) IV  40 mg Intravenous Q12H  . sodium chloride flush  3 mL Intravenous Q12H    Assessment/Plan Diverticulitis with perforation S/p laparoscopic-assisted sigmoid colectomy with colorectal anastomosis, mobilization of splenic flexure, 07/11/16, [name] (findings: diverticulitis with inflammatory adhesions to the uterus and abdominal wall) POD 7 Some bleeding with BM 07/16/16 - after dialysis ESRD -HD - MWF PAF Hypertension Anemia FEN: No IV fluids/renal diet ID: Antibiotics 12/06 -07/12/16; none since 07/12/16 DVT: SCD/Warfarin per pharmacy - INR Pending   I discussed this with nursing, we need to clean the incision with soap and water, and keep a dressing over site under the panus.  From our standpoint she can go home when medically ready.  She needs to have tight control of her anticoagulation.  I will set her up with staple removal and follow up in our office with Dr. Kae Heller.     LOS: 22 days    Archibald Marchetta 07/18/2016 [phone]

## 2016-07-18 NOTE — Discharge Planning (Signed)
AVS reviewed and given to patient with 2 rx. Pt verbalizes understanding. Pt says she has Coumadin 5mg  at home and she will need to take half a pill to equal 2.5 mg.  Will dc to private car home with all personal belongings accompanied by daughter at 34.

## 2016-07-18 NOTE — Progress Notes (Signed)
ANTICOAGULATION CONSULT NOTE - Initial Consult  Pharmacy Consult for Coumadin Indication: atrial fibrillation  Allergies  Allergen Reactions  . Tape Other (See Comments)    PLASTIC TAPE TEARS OFF THE SKIN AND BRUISES IT TERRIBLY!!  . Latex Rash    Patient Measurements: Height: 5\' 5"  (165.1 cm) Weight: 234 lb 2.1 oz (106.2 kg) IBW/kg (Calculated) : 57  Vital Signs: Temp: 98 F (36.7 C) (12/29 1225) Temp Source: Oral (12/29 1225) BP: 119/58 (12/29 1225) Pulse Rate: 64 (12/29 1225)   Assessment: 70 year old female on Coumadin 5mg  daily exc 7.5mg  on Wed/Fri PTA for h/o Afib. INR was elevated and doses were held for OR. Now Coumadin restarted s/p OR. INR trending up quickly to 3.17, then noticed blood with BM on 12/27. Coumadin held and Vit K given. Now now further bleeding noted in her stool. INR down to 1.23 today. Plan is to discharge patient home today and restart Coumadin. INR trended up quickly last time with 3 doses so will start with lower dose this time. Hgb 8.4, plts wnl. No s/s of bleed. Also on amio.  Goal of Therapy:  INR 2-3 Monitor platelets by anticoagulation protocol: Yes   Plan:  Restart Coumadin at 2.5mg  PO daily Plan to discharge today Follow up INR check soon with home health  Elenor Quinones, PharmD, BCPS Clinical Pharmacist Pager [phone] 07/18/2016 2:18 PM

## 2016-07-18 NOTE — Discharge Summary (Signed)
Physician Discharge Summary  Jane [name] BOF:[id number] DOB: October 12, 1945 DOA: 06/25/2016  PCP: [place] date: 06/25/2016 Discharge date: 07/18/2016  Admitted From: Home  Disposition:  Home   Recommendations for Outpatient Follow-up:  1. Follow up with PCP in 1-2 weeks 2. Please obtain BMP/CBC in one week 3. Needs INR in 48 hours. Please monitor closely INR due to recent Bleed post sx.   Home Health: Yes.   Discharge Condition: Stable.  CODE STATUS: Full code.  Diet recommendation: Heart Healthy   Brief/Interim Summary: 70 y.o.BF PMHx ESRD on HD M/W/F,A-Fib on warfarin, HTN, and anemiaWho presents with chest pain for 1 day and abdominal pain for 3 months. Regarding chest pain, this was a new complaint today. The patient went to HD, and during her session, developed a sharp, severe, constant pain under her right breast associated with shortness of breath and nausea. She finished her treatment, then the HD nurse administered O2 and nitroglycerin, which caused her pain to subside, and recommended she go to the ER.  Patient was initially placed on antibiotics with ciprofloxacin and metronidazole, she was very slow to improve then failed advancement of her diet, general surgery was consulted. Her antibodies were transitioned to Zosyn, and another attempt was made to advance her diet as she was appearing to improve clinically over the weekend. However when she got to soft diet her pain returned. She was put on clear liquid diet and repeat CT abd and pelvis ordered showed possible microperforation. She was taken to OR on 12/22 for lap sigmoid colectomy with colorectal anastomosis and mobilization of splenic flexure.   She was started on clear liquid diet and advanced to full liquid. Recommended to ambulate as much as she can.  Patient report bloody stool, bright red per rectum    Assessment & Plan: Diverticulitis with perforation - repeat CT scan 12/20 shows   Severe sigmoid diverticulitis with similar contained microperforation, no drainable fluid collection. Considering eccentric nature of the mural thickening, recommend follow-up endoscopy after resolution of acute symptoms to exclude mass - she was initially on Cipro flagyl , transitioned to zosyn and discontinued after surgery.  - though she remains afebrile without any leukocytosis, she has persistent abdominal pain , .  - she underwent lap sigmoid colectomy with colorectal anastomosis and splenic flexure mobilization on 12/22.   Started clears, continues to have nausea and vomiting on 12/24- 12/25. abd x ray shows dilated bowel loops, possibly post ileus vs SBO.  Patient develop bright red blood per rectum thought to be secondary to bleed from anastomosis. Coumadin was stop. Received Vitamin k. subsequently bleeding stop. Hb remain stable.  Ok to resume coumadin today per surgery.  Stable to be discharge.   Lower GI bleed; might be related to bleeding from recent anastomosis. Surgery following.  Develops Bright red per rectum.  Denies blood in the stool this am.  Hb stable at 8.4  Received one dose of vitamin K. Iron deficiency. Received IV iron.   ESRD on HD - Patient clinically continues to be euvolemic. - HD per nephrology, she is on MWF schedule- -had HD 12-27 and 12-29  HTN - better controlled.  - continue Coreg   Paroxysmal atrial fibrillation - Currently in NSR -  INR at 1.3.  -per surgery ok to resume coumadin. Will resume lower dose due to recent bleed.  -Needs INR on 48 hours.    Hyponatremia: probably from ESRD.    Anemia of chronic Renal disease  Discharge Diagnoses:  Principal Problem:   Diverticulitis of large intestine with perforation Active Problems:   ESRD on hemodialysis ([place])   Paroxysmal atrial fibrillation (H[facility])   Essential hypertension   Anemia in other chronic diseases classified elsewhere   Chest pain with moderate risk for  cardiac etiology    Discharge Instructions  Discharge Instructions    Diet - low sodium heart healthy    Complete by:  As directed    Increase activity slowly    Complete by:  As directed      Allergies as of 07/18/2016      Reactions   Tape Other (See Comments)   PLASTIC TAPE TEARS OFF THE SKIN AND BRUISES IT TERRIBLY!!   Latex Rash      Medication List    STOP taking these medications   furosemide 80 MG tablet Commonly known as:  LASIX   losartan 100 MG tablet Commonly known as:  COZAAR     TAKE these medications   acetaminophen 500 MG tablet Commonly known as:  TYLENOL Take 1,000 mg by mouth 2 (two) times daily as needed (for pain).   amiodarone 200 MG tablet Commonly known as:  PACERONE Take 200 mg by mouth daily.   Biotin 10000 MCG Tabs Take 1 tablet by mouth daily.   carvedilol 25 MG tablet Commonly known as:  COREG Take 50 mg by mouth 2 (two) times daily with a meal.   cinacalcet 30 MG tablet Commonly known as:  SENSIPAR Take 30 mg by mouth daily.   feeding supplement (PRO-STAT SUGAR FREE 64) Liqd Take 30 mLs by mouth 2 (two) times daily.   multivitamin Tabs tablet Take 1 tablet by mouth daily.   PHILLIPS STOOL SOFTENER 100 MG capsule Generic drug:  docusate sodium Take 200 mg by mouth 2 (two) times daily.   polyethylene glycol powder powder Commonly known as:  GLYCOLAX/MIRALAX Take 17 g by mouth every other day. MIX AND DRINK   sevelamer carbonate 800 MG tablet Commonly known as:  RENVELA Take 800-1,600 mg by mouth See admin instructions. 1,600 mg after meals TWO times a day and 800 mg after each snack   warfarin 2.5 MG tablet Commonly known as:  COUMADIN Take 1 tablet (2.5 mg total) by mouth daily at 6 PM. What changed:  medication strength  how much to take  when to take this  Another medication with the same name was removed. Continue taking this medication, and follow the directions you see here.      Follow-up Information     Clovis Riley, MD Follow up on 07/28/2016.   Specialty:  General Surgery Why:  Your appointment is at 12:15 PM, be a the office 30 minutes early for check in.   Contact information: [phone]          Allergies  Allergen Reactions  . Tape Other (See Comments)    PLASTIC TAPE TEARS OFF THE SKIN AND BRUISES IT TERRIBLY!!  . Latex Rash    Consultations:  Surgery  Renal    Procedures/Studies: Dg Chest 2 View  Result Date: 06/25/2016 CLINICAL DATA:  Right-sided chest pain and shortness of breath, initial encounter EXAM: CHEST  2 VIEW COMPARISON:  None. FINDINGS: Cardiac shadow is at the upper limits of normal in size. Aortic calcifications are noted. Long segment stenting in the region of the right shoulder is noted. The lungs are well aerated bilaterally. No focal infiltrate or sizable effusion is seen. No bony abnormality is noted. IMPRESSION:  No acute abnormality seen. Electronically Signed   By: Inez Catalina M.D.   On: 06/25/2016 13:21   Ct Abdomen Pelvis W Contrast  Result Date: 07/09/2016 CLINICAL DATA:  Follow-up diverticulitis. Recurrent abdominal pain with advanced diet. History of end-stage renal disease on dialysis. EXAM: CT ABDOMEN AND PELVIS WITH CONTRAST TECHNIQUE: Multidetector CT imaging of the abdomen and pelvis was performed using the standard protocol following bolus administration of intravenous contrast. CONTRAST:  100 cc ISOVUE-300 IOPAMIDOL (ISOVUE-300) INJECTION 61% COMPARISON:  CT abdomen and pelvis July 03, 2016. FINDINGS: LOWER CHEST: Lung bases are clear. The heart is mildly enlarged. No pericardial effusions. HEPATOBILIARY: Mild focal fatty infiltration about the falciform ligament, liver is otherwise unremarkable. Mildly distended gallbladder without pericholecystic inflammation or gallstones. PANCREAS: Normal. SPLEEN: Normal. ADRENALS/URINARY TRACT: Kidneys are orthotopic, demonstrating symmetric enhancement. Symmetrically atrophic kidneys.  Multiple bilateral renal cysts measure up to 2.2 cm on the RIGHT. No nephrolithiasis, hydronephrosis or solid renal masses. The unopacified ureters are normal in course and caliber. Delayed imaging through the kidneys demonstrates symmetric prompt contrast excretion within the proximal urinary collecting system. Urinary bladder is partially distended and unremarkable. Normal adrenal glands. STOMACH/BOWEL: Approximate 12 cm segment of sigmoid acute diverticulitis, with mesenteric mural thickening. Pericolonic fat stranding. Contained micro perforation anti mesenteric soft tissues. No drainable fluid collection. No intraperitoneal free air. No bowel obstruction, contrast to the level of the rectum. Small and large bowel air-fluid levels. The stomach, small bowel are normal in course and caliber without inflammatory changes. VASCULAR/LYMPHATIC: Aortoiliac vessels are normal in course and caliber, moderate atherosclerosis. No lymphadenopathy by CT size criteria. REPRODUCTIVE: LEFT adnexae is mildly enlarged with heterogeneous enhancement. OTHER: No intraperitoneal free fluid or free air. MUSCULOSKELETAL: Nonacute. Moderate sacroiliac osteoarthrosis. Broad dextroscoliosis. Status post L4-5 PLIF, non incorporated interbody disc material with trace lucency about the LEFT L5 pedicle screw. IMPRESSION: Severe sigmoid diverticulitis with similar contained microperforation, no drainable fluid collection. Considering eccentric nature of the mural thickening, recommend follow-up endoscopy after resolution of acute symptoms to exclude mass. Presumed reactive changes of the adjacent LEFT adnexum.  Mild ileus. L4-5 PLIF with mild lucency about the L5 pedicle screw and, non incorporated interbody fusion material concerning for hardware failure. Electronically Signed   By: Elon Alas M.D.   On: 07/09/2016 20:44   Ct Abdomen Pelvis W Contrast  Result Date: 07/03/2016 CLINICAL DATA:  Left lower quadrant abdominal pain for  several months. EXAM: CT ABDOMEN AND PELVIS WITH CONTRAST TECHNIQUE: Multidetector CT imaging of the abdomen and pelvis was performed using the standard protocol following bolus administration of intravenous contrast. CONTRAST:  173mL ISOVUE-300 IOPAMIDOL (ISOVUE-300) INJECTION 61% COMPARISON:  CT scan of June 25, 2016. FINDINGS: Lower chest: No acute abnormality. Hepatobiliary: No focal liver abnormality is seen. No gallstones, gallbladder wall thickening, or biliary dilatation. Pancreas: Unremarkable. No pancreatic ductal dilatation or surrounding inflammatory changes. Spleen: Normal in size without focal abnormality. Adrenals/Urinary Tract: Adrenal glands appear normal. Bilateral renal atrophy is noted with stable bilateral renal cysts. No hydronephrosis or renal obstruction is noted. Urinary bladder is decompressed. Stomach/Bowel: The appendix appears normal. There is no evidence of bowel obstruction. Sigmoid diverticulosis is noted with wall thickening and surrounding inflammation suggesting sigmoid diverticulitis. There does appear to an increased amount of gas in the peritoneal fat adjacent to sigmoid colon concerning for micro perforation related to inflammation. Vascular/Lymphatic: Aortic atherosclerosis. No enlarged abdominal or pelvic lymph nodes. Reproductive: Uterus and bilateral adnexa are unremarkable. Other: No abdominal wall hernia or abnormality. No abdominopelvic  ascites. Musculoskeletal: Status post surgical posterior fusion of L4-5 with bilateral intrapedicular screw placement. Multilevel degenerative disc disease is noted in the lower lumbar spine. IMPRESSION: Findings consistent with acute sigmoid diverticulitis, with increased amount of gas seen in adjacent peritoneal fat compared to prior exam, consistent with micro perforation and possible developing abscess. Aortic atherosclerosis. Electronically Signed   By: Marijo Conception, M.D.   On: 07/03/2016 08:51   Ct Abdomen Pelvis W  Contrast  Addendum Date: 06/25/2016   ADDENDUM REPORT: 06/25/2016 20:49 ADDENDUM: Upon further review, there are a few small locules of gas positioned just adjacent to the sigmoid colon, which likely reflect a small micro perforation. This is best appreciated on coronal sequence (series 5, image 58). This is closely approximated to the adjacent uterus and left ovary. Additionally, possible small diverticular abscess present adjacent to the sigmoid measures approximately 2.5 x 1.0 cm (series 2, image 56). Given the relative lack of extensive inflammatory changes, these findings are suspected to have been ongoing/smoldering in nature, which could correlate with patient's waxing and waning symptoms. No definite mass lesion identified. Critical Value/emergent results were called by telephone at the time of interpretation on 06/25/2016 at 8:44 pm to Dr. Norina Buzzard , who verbally acknowledged these results. Electronically Signed   By: Jeannine Boga M.D.   On: 06/25/2016 20:49   Result Date: 06/25/2016 CLINICAL DATA:  Initial evaluation for acute left flank pain. EXAM: CT ABDOMEN AND PELVIS WITH CONTRAST TECHNIQUE: Multidetector CT imaging of the abdomen and pelvis was performed using the standard protocol following bolus administration of intravenous contrast. CONTRAST:  161mL ISOVUE-300 IOPAMIDOL (ISOVUE-300) INJECTION 61% COMPARISON:  None available. FINDINGS: Lower chest: Visualized lung bases are clear. Mild cardiomegaly partially visualized. Hepatobiliary: Liver demonstrates a normal contrast enhanced appearance. Gallbladder normal. No biliary dilatation. Pancreas: Pancreas within normal limits. Spleen: Spleen within normal limits. Adrenals/Urinary Tract: Adrenal glands are normal. Kidneys are somewhat small and atrophic bilaterally, compatible with history of end-stage renal disease. Innumerable cystic lesions present. The larger of these lesions measure water density common ir most compatible with  cysts. The smaller lesions are too small the characterize. No hydroureter. Bladder largely decompressed. Mild circumferential bladder wall thickening most likely related incomplete distension. Stomach/Bowel: Stomach within normal limits. No evidence for bowel obstruction. Appendix normal. Sigmoid diverticulosis without imaging findings to suggest acute diverticulitis. No acute inflammatory changes identified about the bowels. Vascular/Lymphatic: Moderate aorto bi-iliac atherosclerotic disease. No aneurysm. Shotty subcentimeter retroperitoneal lymph nodes noted. No pathologically enlarged intra-abdominal or pelvic lymph nodes identified. Reproductive: Uterus within normal limits. Ovaries grossly unremarkable. Other: No free intraperitoneal air. No free fluid. Small fat containing paraumbilical hernia noted. Musculoskeletal: Postsurgical changes noted within the lumbar spine. No acute osseous abnormality. No worrisome lytic or blastic osseous lesions. IMPRESSION: 1. No CT evidence for acute intra-abdominal pelvic process. 2. Sigmoid diverticulosis without evidence for acute diverticulitis. 3. Small and atrophic kidneys bilaterally with multiple cysts present, compatible with history of end-stage renal disease. Electronically Signed: By: Jeannine Boga M.D. On: 06/25/2016 17:38   Ir Fluoro Guide Cv Line Right  Result Date: 07/09/2016 CLINICAL DATA:  Renal failure, poor peripheral venous access, needs durable venous access for antibiotic regimen. EXAM: RIGHT IJ CENTRAL VENOUS CATHETER PLACEMENT UNDER ULTRASOUND AND FLUOROSCOPIC GUIDANCE FLUOROSCOPY TIME:  0.5 minute (15 uGym2 DAP) TECHNIQUE: The procedure, risks (including but not limited to bleeding, infection, organ damage ), benefits, and alternatives were explained to the patient. Questions regarding the procedure were encouraged and answered. The patient  understands and consents to the procedure. Patency of the right IJ vein was confirmed with ultrasound  with image documentation. An appropriate skin site was determined. Skin site was marked. Region was prepped using maximum barrier technique including cap and mask, sterile gown, sterile gloves, large sterile sheet, and Chlorhexidine as cutaneous antisepsis. The region was infiltrated locally with 1% lidocaine. Under real-time ultrasound guidance, the right IJ vein was accessed with a 21 gauge micropuncture needle; the needle tip within the vein was confirmed with ultrasound image documentation. The needle exchanged over a guidewire for peel-away sheath through which an 15cm 5 [place] PowerPICC dual lumen catheter was advanced. This was positioned with the tip at the cavoatrial junction. Spot chest radiograph shows good positioning and no pneumothorax. Catheter was flushed and sutured externally with 0-Prolene sutures. Patient tolerated the procedure well. COMPLICATIONS: None immediate IMPRESSION: 1. Technically successful right IJ double lumen power injectable central venous catheter placement. Electronically Signed   By: Lucrezia Europe M.D.   On: 07/09/2016 14:52   Ir US Guide Vasc Access Right  Result Date: 07/09/2016 CLINICAL DATA:  Renal failure, poor peripheral venous access, needs durable venous access for antibiotic regimen. EXAM: RIGHT IJ CENTRAL VENOUS CATHETER PLACEMENT UNDER ULTRASOUND AND FLUOROSCOPIC GUIDANCE FLUOROSCOPY TIME:  0.5 minute (15 uGym2 DAP) TECHNIQUE: The procedure, risks (including but not limited to bleeding, infection, organ damage ), benefits, and alternatives were explained to the patient. Questions regarding the procedure were encouraged and answered. The patient understands and consents to the procedure. Patency of the right IJ vein was confirmed with ultrasound with image documentation. An appropriate skin site was determined. Skin site was marked. Region was prepped using maximum barrier technique including cap and mask, sterile gown, sterile gloves, large sterile sheet, and  Chlorhexidine as cutaneous antisepsis. The region was infiltrated locally with 1% lidocaine. Under real-time ultrasound guidance, the right IJ vein was accessed with a 21 gauge micropuncture needle; the needle tip within the vein was confirmed with ultrasound image documentation. The needle exchanged over a guidewire for peel-away sheath through which an 15cm 5 [place] PowerPICC dual lumen catheter was advanced. This was positioned with the tip at the cavoatrial junction. Spot chest radiograph shows good positioning and no pneumothorax. Catheter was flushed and sutured externally with 0-Prolene sutures. Patient tolerated the procedure well. COMPLICATIONS: None immediate IMPRESSION: 1. Technically successful right IJ double lumen power injectable central venous catheter placement. Electronically Signed   By: Lucrezia Europe M.D.   On: 07/09/2016 14:52   Dg Abd 2 Views  Result Date: 07/14/2016 CLINICAL DATA:  Lower abdominal pain with nausea and vomiting. Recent sigmoid colectomy. EXAM: ABDOMEN - 2 VIEW COMPARISON:  CT abdomen and pelvis 07/09/2016 FINDINGS: Gas is present in multiple loops of mildly to moderately dilated small bowel in the left and right mid abdomen with multiple small air-fluid levels. The residual oral contrast is present in the proximal colon. A small amount of gas is present throughout the colon. There is also contrast in the rectum. No definite intraperitoneal free air. Skin staples are noted over the lower abdomen. Prior lumbar fusion. IMPRESSION: Mild-to-moderate small bowel dilatation with small volume gas and contrast in the colon. Findings may reflect postoperative ileus, although partial small bowel obstruction is not excluded. Electronically Signed   By: Logan Bores M.D.   On: 07/14/2016 09:33      Subjective: Feeling better, denies blood in the stool./   Discharge Exam: Vitals:   07/18/16 1225 07/18/16 1447  BP: Marland Kitchen)  119/58 (!) 107/58  Pulse: 64 62  Resp: 20 20  Temp: 98 F  (36.7 C) 98 F (36.7 C)   Vitals:   07/18/16 1130 07/18/16 1153 07/18/16 1225 07/18/16 1447  BP: (!) 118/50 (!) 119/51 (!) 119/58 (!) 107/58  Pulse: 64 66 64 62  Resp:  20 20 20   Temp:  98 F (36.7 C) 98 F (36.7 C) 98 F (36.7 C)  TempSrc:  Oral Oral Oral  SpO2:  98% 99% 96%  Weight:  106.2 kg (234 lb 2.1 oz)    Height:        General: Pt is alert, awake, not in acute distress Cardiovascular: RRR, S1/S2 +, no rubs, no gallops Respiratory: CTA bilaterally, no wheezing, no rhonchi Abdominal: Soft, NT, ND, bowel sounds + Extremities: no edema, no cyanosis    The results of significant diagnostics from this hospitalization (including imaging, microbiology, ancillary and laboratory) are listed below for reference.     Microbiology: No results found for this or any previous visit (from the past 240 hour(s)).   Labs: BNP (last 3 results) No results for input(s): BNP in the last 8760 hours. Basic Metabolic Panel:  Recent Labs Lab 07/12/16 0450 07/12/16 0900 07/13/16 0900 07/16/16 0739 07/18/16 0730  NA 132* 133* 131* 134* 133*  K 3.9 4.0 3.9 3.7 3.2*  CL 96* 96* 96* 98* 98*  CO2 23 23 24 25 23   GLUCOSE 77 73 103* 82 91  BUN 17 19 13  35* 17  CREATININE 7.94* 8.23* 6.32* 8.75* 7.59*  CALCIUM 7.0* 7.0* 7.5* 7.9* 7.7*  MG 2.0  --   --   --   --   PHOS  --  6.0* 4.1 4.4 3.3   Liver Function Tests:  Recent Labs Lab 07/12/16 0900 07/13/16 0900 07/16/16 0739 07/18/16 0730  ALBUMIN 2.5* 2.6* 2.5* 2.5*   No results for input(s): LIPASE, AMYLASE in the last 168 hours. No results for input(s): AMMONIA in the last 168 hours. CBC:  Recent Labs Lab 07/13/16 0900 07/16/16 0446 07/16/16 0739  07/17/16 0053 07/17/16 0443 07/17/16 1441 07/17/16 2226 07/18/16 0730  WBC 11.6* 5.5 5.8  --   --  6.6  --   --  6.5  HGB 9.5* 9.0* 8.7*  < > 8.8* 8.3* 8.4* 8.1* 8.4*  HCT 29.4* 28.1* 27.1*  < > 27.3* 25.8* 26.8* 25.4* 25.9*  MCV 88.0 88.1 88.3  --   --  88.7  --   --   88.7  PLT 254 288 283  --   --  251  --   --  264  < > = values in this interval not displayed. Cardiac Enzymes: No results for input(s): CKTOTAL, CKMB, CKMBINDEX, TROPONINI in the last 168 hours. BNP: Invalid input(s): POCBNP CBG: No results for input(s): GLUCAP in the last 168 hours. D-Dimer No results for input(s): DDIMER in the last 72 hours. Hgb A1c No results for input(s): HGBA1C in the last 72 hours. Lipid Profile No results for input(s): CHOL, HDL, LDLCALC, TRIG, CHOLHDL, LDLDIRECT in the last 72 hours. Thyroid function studies No results for input(s): TSH, T4TOTAL, T3FREE, THYROIDAB in the last 72 hours.  Invalid input(s): FREET3 Anemia work up  Recent Labs  07/17/16 1056  FERRITIN 1,126*  TIBC 157*  IRON 25*   Urinalysis    Component Value Date/Time   COLORURINE AMBER (A) 06/30/2016 0503   APPEARANCEUR HAZY (A) 06/30/2016 0503   LABSPEC 1.020 06/30/2016 0503   PHURINE 5.0 06/30/2016 0503  GLUCOSEU NEGATIVE 06/30/2016 0503   HGBUR NEGATIVE 06/30/2016 0503   BILIRUBINUR SMALL (A) 06/30/2016 0503   KETONESUR NEGATIVE 06/30/2016 0503   PROTEINUR 30 (A) 06/30/2016 0503   UROBILINOGEN 0.2 06/30/2012 2201   NITRITE NEGATIVE 06/30/2016 0503   LEUKOCYTESUR SMALL (A) 06/30/2016 0503   Sepsis Labs Invalid input(s): PROCALCITONIN,  WBC,  LACTICIDVEN Microbiology No results found for this or any previous visit (from the past 240 hour(s)).   Time coordinating discharge: Over 30 minutes  SIGNED:   Elmarie Shiley, MD  [facility] 07/18/2016, 3:45 PM Pager   If 7PM-7AM, please contact night-coverage [email or web address] Password [email or web address]

## 2016-07-20 DIAGNOSIS — N2581 Secondary hyperparathyroidism of renal origin: Secondary | ICD-10-CM | POA: Diagnosis not present

## 2016-07-20 DIAGNOSIS — I158 Other secondary hypertension: Secondary | ICD-10-CM | POA: Diagnosis not present

## 2016-07-20 DIAGNOSIS — D631 Anemia in chronic kidney disease: Secondary | ICD-10-CM | POA: Diagnosis not present

## 2016-07-20 DIAGNOSIS — Z992 Dependence on renal dialysis: Secondary | ICD-10-CM | POA: Diagnosis not present

## 2016-07-20 DIAGNOSIS — N186 End stage renal disease: Secondary | ICD-10-CM | POA: Diagnosis not present

## 2016-07-23 DIAGNOSIS — I4891 Unspecified atrial fibrillation: Secondary | ICD-10-CM | POA: Diagnosis not present

## 2016-07-23 DIAGNOSIS — D631 Anemia in chronic kidney disease: Secondary | ICD-10-CM | POA: Diagnosis not present

## 2016-07-23 DIAGNOSIS — N186 End stage renal disease: Secondary | ICD-10-CM | POA: Diagnosis not present

## 2016-07-23 DIAGNOSIS — N2581 Secondary hyperparathyroidism of renal origin: Secondary | ICD-10-CM | POA: Diagnosis not present

## 2016-07-25 DIAGNOSIS — D631 Anemia in chronic kidney disease: Secondary | ICD-10-CM | POA: Diagnosis not present

## 2016-07-25 DIAGNOSIS — N186 End stage renal disease: Secondary | ICD-10-CM | POA: Diagnosis not present

## 2016-07-25 DIAGNOSIS — N2581 Secondary hyperparathyroidism of renal origin: Secondary | ICD-10-CM | POA: Diagnosis not present

## 2016-07-28 DIAGNOSIS — N186 End stage renal disease: Secondary | ICD-10-CM | POA: Diagnosis not present

## 2016-07-28 DIAGNOSIS — D631 Anemia in chronic kidney disease: Secondary | ICD-10-CM | POA: Diagnosis not present

## 2016-07-28 DIAGNOSIS — N2581 Secondary hyperparathyroidism of renal origin: Secondary | ICD-10-CM | POA: Diagnosis not present

## 2016-07-30 DIAGNOSIS — N186 End stage renal disease: Secondary | ICD-10-CM | POA: Diagnosis not present

## 2016-07-30 DIAGNOSIS — N2581 Secondary hyperparathyroidism of renal origin: Secondary | ICD-10-CM | POA: Diagnosis not present

## 2016-07-30 DIAGNOSIS — I4891 Unspecified atrial fibrillation: Secondary | ICD-10-CM | POA: Diagnosis not present

## 2016-07-30 DIAGNOSIS — D631 Anemia in chronic kidney disease: Secondary | ICD-10-CM | POA: Diagnosis not present

## 2016-08-01 DIAGNOSIS — N2581 Secondary hyperparathyroidism of renal origin: Secondary | ICD-10-CM | POA: Diagnosis not present

## 2016-08-01 DIAGNOSIS — D631 Anemia in chronic kidney disease: Secondary | ICD-10-CM | POA: Diagnosis not present

## 2016-08-01 DIAGNOSIS — N186 End stage renal disease: Secondary | ICD-10-CM | POA: Diagnosis not present

## 2016-08-04 ENCOUNTER — Emergency Department (HOSPITAL_COMMUNITY)
Admission: RE | Admit: 2016-08-04 | Discharge: 2016-08-04 | Disposition: A | Discharge status: Home / Self Care | Payer: Medicare Other | Attending: Emergency Medicine | Admitting: Emergency Medicine

## 2016-08-04 ENCOUNTER — Emergency Department (HOSPITAL_COMMUNITY): Payer: Medicare Other

## 2016-08-04 ENCOUNTER — Encounter (HOSPITAL_COMMUNITY): Payer: Self-pay | Admitting: *Deleted

## 2016-08-04 DIAGNOSIS — K5900 Constipation, unspecified: Secondary | ICD-10-CM

## 2016-08-04 DIAGNOSIS — N186 End stage renal disease: Secondary | ICD-10-CM | POA: Diagnosis not present

## 2016-08-04 DIAGNOSIS — R109 Unspecified abdominal pain: Secondary | ICD-10-CM | POA: Diagnosis not present

## 2016-08-04 DIAGNOSIS — Z91040 Latex allergy status: Secondary | ICD-10-CM | POA: Diagnosis not present

## 2016-08-04 DIAGNOSIS — Z79899 Other long term (current) drug therapy: Secondary | ICD-10-CM | POA: Diagnosis not present

## 2016-08-04 DIAGNOSIS — Z7901 Long term (current) use of anticoagulants: Secondary | ICD-10-CM | POA: Diagnosis not present

## 2016-08-04 DIAGNOSIS — Z992 Dependence on renal dialysis: Secondary | ICD-10-CM | POA: Insufficient documentation

## 2016-08-04 DIAGNOSIS — R1031 Right lower quadrant pain: Secondary | ICD-10-CM

## 2016-08-04 DIAGNOSIS — I120 Hypertensive chronic kidney disease with stage 5 chronic kidney disease or end stage renal disease: Secondary | ICD-10-CM | POA: Diagnosis not present

## 2016-08-04 LAB — CBC
HCT: 32.2 % — ABNORMAL LOW (ref 36.0–46.0)
HEMOGLOBIN: 10.1 g/dL — AB (ref 12.0–15.0)
MCH: 29 pg (ref 26.0–34.0)
MCHC: 31.4 g/dL (ref 30.0–36.0)
MCV: 92.5 fL (ref 78.0–100.0)
PLATELETS: 154 10*3/uL (ref 150–400)
RBC: 3.48 MIL/uL — AB (ref 3.87–5.11)
RDW: 18.2 % — ABNORMAL HIGH (ref 11.5–15.5)
WBC: 5.8 10*3/uL (ref 4.0–10.5)

## 2016-08-04 LAB — URINALYSIS, ROUTINE W REFLEX MICROSCOPIC
BILIRUBIN URINE: NEGATIVE
GLUCOSE, UA: NEGATIVE mg/dL
HGB URINE DIPSTICK: NEGATIVE
Ketones, ur: NEGATIVE mg/dL
LEUKOCYTES UA: NEGATIVE
Nitrite: NEGATIVE
PH: 6 (ref 5.0–8.0)
Protein, ur: 30 mg/dL — AB
SPECIFIC GRAVITY, URINE: 1.009 (ref 1.005–1.030)

## 2016-08-04 LAB — COMPREHENSIVE METABOLIC PANEL
ALT: 11 U/L — AB (ref 14–54)
ANION GAP: 13 (ref 5–15)
AST: 18 U/L (ref 15–41)
Albumin: 3.2 g/dL — ABNORMAL LOW (ref 3.5–5.0)
Alkaline Phosphatase: 104 U/L (ref 38–126)
BUN: 28 mg/dL — ABNORMAL HIGH (ref 6–20)
CALCIUM: 8.8 mg/dL — AB (ref 8.9–10.3)
CO2: 22 mmol/L (ref 22–32)
CREATININE: 6.44 mg/dL — AB (ref 0.44–1.00)
Chloride: 104 mmol/L (ref 101–111)
GFR, EST AFRICAN AMERICAN: 7 mL/min — AB (ref >60)
GFR, EST NON AFRICAN AMERICAN: 6 mL/min — AB (ref >60)
Glucose, Bld: 88 mg/dL (ref 65–99)
Potassium: 4.9 mmol/L (ref 3.5–5.1)
Sodium: 139 mmol/L (ref 135–145)
Total Bilirubin: 0.5 mg/dL (ref 0.3–1.2)
Total Protein: 7.1 g/dL (ref 6.5–8.1)

## 2016-08-04 LAB — PROTIME-INR
INR: 2.08
Prothrombin Time: 23.7 seconds — ABNORMAL HIGH (ref 11.4–15.2)

## 2016-08-04 LAB — I-STAT CG4 LACTIC ACID, ED
LACTIC ACID, VENOUS: 1.29 mmol/L (ref 0.5–1.9)
Lactic Acid, Venous: 1.64 mmol/L (ref 0.5–1.9)

## 2016-08-04 LAB — LIPASE, BLOOD: Lipase: 27 U/L (ref 11–51)

## 2016-08-04 MED ORDER — MAGNESIUM CITRATE PO SOLN: 1.0000 | Freq: Once | ORAL | 0 refills | Status: AC

## 2016-08-04 MED ORDER — ONDANSETRON HCL 4 MG/2ML IJ SOLN: 4.0000 mg | Freq: Once | INTRAMUSCULAR | Status: DC

## 2016-08-04 MED ORDER — IOPAMIDOL (ISOVUE-300) INJECTION 61%
INTRAVENOUS | Status: AC
  Filled 2016-08-04: qty 30

## 2016-08-04 MED ORDER — ONDANSETRON 4 MG PO TBDP
4.0000 mg | ORAL_TABLET | Freq: Once | ORAL | Status: AC
  Administered 2016-08-04: 4 mg via ORAL
  Filled 2016-08-04: qty 1

## 2016-08-04 MED ORDER — MORPHINE SULFATE (PF) 4 MG/ML IV SOLN
6.0000 mg | Freq: Once | INTRAVENOUS | Status: AC
  Administered 2016-08-04: 6 mg via INTRAMUSCULAR
  Filled 2016-08-04: qty 2

## 2016-08-04 MED ORDER — MORPHINE SULFATE (PF) 4 MG/ML IV SOLN
4.0000 mg | Freq: Once | INTRAVENOUS | Status: AC
  Administered 2016-08-04: 4 mg via INTRAMUSCULAR
  Filled 2016-08-04: qty 1

## 2016-08-04 MED ORDER — OXYCODONE-ACETAMINOPHEN 5-325 MG PO TABS: 1.0000 | ORAL_TABLET | Freq: Four times a day (QID) | ORAL | 0 refills | Status: DC | PRN

## 2016-08-04 MED ORDER — MORPHINE SULFATE (PF) 4 MG/ML IV SOLN: 4.0000 mg | Freq: Once | INTRAVENOUS | Status: DC

## 2016-08-04 NOTE — ED Notes (Signed)
Unable to obtain IV access. MD aware. Orders changed and plan to place line in neck if needed. MD wants to see CT scan first.

## 2016-08-04 NOTE — ED Notes (Signed)
Patient refused any more sticks for blood or IV access in arm. Okay to have IV in neck.

## 2016-08-04 NOTE — ED Triage Notes (Signed)
The pt is c/o rt lower abd pain  Since yesterday  She had a colectomy dec 2o0th and she was seen this past Thursday  In the office and everything checked out ok.  Then the pain started  It feels like something sticking in her abd

## 2016-08-04 NOTE — ED Notes (Signed)
Pt did not need anything at this time  

## 2016-08-04 NOTE — ED Notes (Signed)
PT went to CT

## 2016-08-04 NOTE — ED Provider Notes (Signed)
[facility] DEPT Provider Note   CSN: [id number] Arrival date & time: 08/04/16  8676     History   Chief Complaint Chief Complaint  Patient presents with  . Abdominal Pain    HPI Jane [name] is a 71 y.o. female.  HPI  Pt with hx of recent colectomy approx 4 weeks ago presents with acute onset of right lower abdominal pain.  She states pain began suddenly last night.  Pain is worse with coughing, moving, bending over.  No vomiting.  Normal BM.  No fever/chills.  Has recently been treated for yeast infection and today is last day of diflucan, states this has been clearing up.  She continues to have burning with urination however.  No increased urgency or frequency.  There are no other associated systemic symptoms, there are no other alleviating or modifying factors.   Past Medical History:  Diagnosis Date  . Acid reflux   . Atrial fibrillation ([place])   . Hypertension   . Renal disorder   . Sleep apnea     Patient Active Problem List   Diagnosis Date Noted  . Diverticulitis of large intestine with perforation 06/26/2016  . Anemia in other chronic diseases classified elsewhere 06/25/2016  . Chest pain with moderate risk for cardiac etiology 06/25/2016  . ESRD on hemodialysis ([place]) 06/30/2012  . Paroxysmal atrial fibrillation ([place]) 06/30/2012  . Essential hypertension 06/30/2012  . UTI (lower urinary tract infection) 06/30/2012    Past Surgical History:  Procedure Laterality Date  . IR GENERIC HISTORICAL  07/09/2016   IR US GUIDE VASC ACCESS RIGHT 07/09/2016 Arne Cleveland, MD [facility]  . IR GENERIC HISTORICAL  07/09/2016   IR FLUORO GUIDE CV LINE RIGHT 07/09/2016 Arne Cleveland, MD [facility]  . KNEE ARTHROPLASTY    . LAPAROSCOPIC SIGMOID COLECTOMY N/A 07/11/2016   Procedure: LAPAROSCOPIC SIGMOID COLECTOMY;  Surgeon: Clovis Riley, MD;  Location: [facility];  Service: General;  Laterality: N/A;  . TUBAL LIGATION      OB History    No data available        Home Medications    Prior to Admission medications   Medication Sig Start Date End Date Taking? Authorizing Provider  albuterol (PROVENTIL HFA;VENTOLIN HFA) 108 (90 Base) MCG/ACT inhaler Inhale 1 puff into the lungs every 6 (six) hours as needed for wheezing or shortness of breath.   Yes Historical Provider, MD  Amino Acids-Protein Hydrolys (FEEDING SUPPLEMENT, PRO-STAT SUGAR FREE 64,) LIQD Take 30 mLs by mouth 2 (two) times daily. 07/18/16  Yes Belkys A Regalado, MD  amiodarone (PACERONE) 200 MG tablet Take 200 mg by mouth daily.   Yes Historical Provider, MD  Biotin 10000 MCG TABS Take 1 tablet by mouth daily.   Yes Historical Provider, MD  carvedilol (COREG) 25 MG tablet Take 50 mg by mouth 2 (two) times daily with a meal.   Yes Historical Provider, MD  cinacalcet (SENSIPAR) 30 MG tablet Take 30 mg by mouth daily.   Yes Historical Provider, MD  docusate sodium (PHILLIPS STOOL SOFTENER) 100 MG capsule Take 200 mg by mouth 2 (two) times daily.   Yes Historical Provider, MD  fluconazole (DIFLUCAN) 100 MG tablet Take 100 mg by mouth daily. 07/31/16  Yes Historical Provider, MD  losartan (COZAAR) 100 MG tablet Take 100 mg by mouth daily.   Yes Historical Provider, MD  multivitamin (RENA-VIT) TABS tablet Take 1 tablet by mouth daily.   Yes Historical Provider, MD  polyethylene glycol powder (GLYCOLAX/MIRALAX) powder Take  17 g by mouth every other day. [place]   Yes Historical Provider, MD  Probiotic Product (PROBIOTIC PO) Take 1 tablet by mouth daily.   Yes Historical Provider, MD  sevelamer carbonate (RENVELA) 800 MG tablet Take 800-1,600 mg by mouth See admin instructions. 1,600 mg after meals TWO times a day and 800 mg after each snack   Yes Historical Provider, MD  warfarin (COUMADIN) 2.5 MG tablet Take 1 tablet (2.5 mg total) by mouth daily at 6 PM. Patient taking differently: Take 5 mg by mouth daily at 6 PM.  07/18/16  Yes Belkys A Regalado, MD  oxyCODONE-acetaminophen  (PERCOCET/ROXICET) 5-325 MG tablet Take 1-2 tablets by mouth every 6 (six) hours as needed for severe pain. 08/04/16   Alfonzo Beers, MD    Family History Family History  Problem Relation Age of Onset  . Heart failure Mother   . Stroke Mother   . Other Father     Social History Social History  Substance Use Topics  . Smoking status: Never Smoker  . Smokeless tobacco: Never Used  . Alcohol use No     Allergies   Tape and Latex   Review of Systems Review of Systems  ROS reviewed and all otherwise negative except for mentioned in HPI   Physical Exam Updated Vital Signs BP 158/61 (BP Location: Left Arm)   Pulse 68 Comment: Simultaneous filing. User may not have seen previous data.  Temp 98.8 F (37.1 C) (Oral)   Resp 15 Comment: Simultaneous filing. User may not have seen previous data.  SpO2 98% Comment: Simultaneous filing. User may not have seen previous data. Vitals reviewed Physical Exam Physical Examination: General appearance - alert, well appearing, and in no distress Mental status - alert, oriented to person, place, and time Eyes - no conjunctival injection, no scleral icterus Mouth - mucous membranes moist, pharynx normal without lesions Chest - clear to auscultation, no wheezes, rales or rhonchi, symmetric air entry Heart - normal rate, regular rhythm, normal S1, S2, no murmurs, rubs, clicks or gallops Abdomen - soft, ttp over right lower abdomen, incisions healing well- clean/dry/intact, nondistended, no masses or organomegaly, nabs Neurological - alert, oriented, normal speech Extremities - peripheral pulses normal, no pedal edema, no clubbing or cyanosis Skin - normal coloration and turgor, no rashes  ED Treatments / Results  Labs (all labs ordered are listed, but only abnormal results are displayed) Labs Reviewed  COMPREHENSIVE METABOLIC PANEL - Abnormal; Notable for the following:       Result Value   BUN 28 (*)    Creatinine, Ser 6.44 (*)     Calcium 8.8 (*)    Albumin 3.2 (*)    ALT 11 (*)    GFR calc non Af Amer 6 (*)    GFR calc Af Amer 7 (*)    All other components within normal limits  CBC - Abnormal; Notable for the following:    RBC 3.48 (*)    Hemoglobin 10.1 (*)    HCT 32.2 (*)    RDW 18.2 (*)    All other components within normal limits  URINALYSIS, ROUTINE W REFLEX MICROSCOPIC - Abnormal; Notable for the following:    APPearance HAZY (*)    Protein, ur 30 (*)    Bacteria, UA MANY (*)    Squamous Epithelial / LPF 0-5 (*)    All other components within normal limits  PROTIME-INR - Abnormal; Notable for the following:    Prothrombin Time 23.7 (*)  All other components within normal limits  URINE CULTURE  LIPASE, BLOOD  I-STAT CG4 LACTIC ACID, ED  I-STAT CG4 LACTIC ACID, ED    EKG  EKG Interpretation None       Radiology Ct Abdomen Pelvis Wo Contrast  Result Date: 08/04/2016 CLINICAL DATA:  Right-sided abdominal pain with nausea began last night. Sigmoid colectomy last month. EXAM: CT ABDOMEN AND PELVIS WITHOUT CONTRAST TECHNIQUE: Multidetector CT imaging of the abdomen and pelvis was performed following the standard protocol without IV contrast. COMPARISON:  Plain films of 07/14/2016. Most recent CT of 07/09/2016. FINDINGS: Lower chest: Clear lung bases. Mild cardiomegaly. Multivessel coronary artery atherosclerosis. Trace left pleural fluid or thickening. Hepatobiliary: Normal liver. Normal gallbladder, without biliary ductal dilatation. Pancreas: Normal, without mass or ductal dilatation. Spleen: Normal in size, without focal abnormality. Adrenals/Urinary Tract: Normal adrenal glands. Mild renal cortical thinning bilaterally. Interpolar right renal fluid density lesion is likely a cyst at approximately 12 mm. No renal calculi or hydronephrosis. Normal urinary bladder. Stomach/Bowel: Normal stomach, without wall thickening. Surgical sutures in the sigmoid. Colonic stool burden suggests constipation. Normal  terminal ileum and appendix. Normal small bowel. Vascular/Lymphatic: Aortic and branch vessel atherosclerosis. No abdominopelvic adenopathy. Reproductive: Vascular calcifications in the uterus. No adnexal mass. Other: Trace nonspecific cul-de-sac fluid on image 75/series 2. Anterior pelvic bilobed fluid collection measures 8.1 x 4.4 cm on image 60/series 2. Musculoskeletal: Lumbar spine fixation. Lumbosacral spondylosis. Convex right lumbar spine curvature. IMPRESSION: 1. Status post sigmoid colectomy, without acute complication. 2. Small volume cul-de-sac fluid, nonspecific. 3.  Possible constipation. 4.  Coronary artery atherosclerosis. Aortic atherosclerosis. 5. Anterior pelvic wall bilobed fluid collection is most likely a postoperative seroma or hematoma. Evaluation limited secondary to lack of IV contrast. Electronically Signed   By: Abigail Miyamoto M.D.   On: 08/04/2016 12:27    Procedures Procedures (including critical care time)  Medications Ordered in ED Medications  morphine 4 MG/ML injection 4 mg (4 mg Intramuscular Given 08/04/16 0910)  ondansetron (ZOFRAN-ODT) disintegrating tablet 4 mg (4 mg Oral Given 08/04/16 0910)  morphine 4 MG/ML injection 6 mg (6 mg Intramuscular Given 08/04/16 1031)     Initial Impression / Assessment and Plan / ED Course  I have reviewed the triage vital signs and the nursing notes.  Pertinent labs & imaging results that were available during my care of the patient were reviewed by me and considered in my medical decision making (see chart for details).  Clinical Course   7:34 AM went to see patient, she is not yet in room 9:16 AM I have discussed need for blood draw for INR- she initally said she did not want any more sticks- is agreeable to have blood draw as long as phlebotomist uses a warm pack.  Pt does not have visible EJ access to me.  If patient needs access will try IJ, but will not use this until it is clear that she needs IV access due to potential of  serious complictaions.  I have discussed all this and plan with her and she verbalizes understanding  Pt presenting with lower abdominal pain, s/p colectomy for diverticulitis several weeks ago.  Labs are reassuring.  Normal hemoglobin and WBC.  Ct scan shows seroma at area of prior colectomy, constipation, no acute findings per radiologist.  Pt has not been taking miralax- advised taking miralax, mag citrate, titrate miralax to soft bm daily.  F/u as scheduled with surgery and with her PMD.  Discharged with strict return precautions.  Pt agreeable with plan.  Final Clinical Impressions(s) / ED Diagnoses   Final diagnoses:  Right lower quadrant abdominal pain  Constipation, unspecified constipation type    New Prescriptions Discharge Medication List as of 08/04/2016  1:19 PM    START taking these medications   Details  magnesium citrate SOLN Take 296 mLs (1 Bottle total) by mouth once., Starting Mon 08/04/2016, Print    oxyCODONE-acetaminophen (PERCOCET/ROXICET) 5-325 MG tablet Take 1-2 tablets by mouth every 6 (six) hours as needed for severe pain., Starting Mon 08/04/2016, Print         Alfonzo Beers, MD 08/05/16 1055

## 2016-08-04 NOTE — Discharge Instructions (Signed)
Return to the ED with any concerns including fever/chills, worsening abdominal pain, difficulty breathing, fainting, decreased level of alertness/lethargy, or any other alarming symptoms  You should be sure to continue taking the stool softener, also miralax daily (can increase to twice daily), add bottle of magnesium citrate- continue taking miralax until you are having almost liquids stools, then decrease miralax.

## 2016-08-04 NOTE — ED Notes (Signed)
IV team at bedside 

## 2016-08-05 LAB — URINE CULTURE: Culture: >=100000 — AB

## 2016-08-06 DIAGNOSIS — N186 End stage renal disease: Secondary | ICD-10-CM | POA: Diagnosis not present

## 2016-08-06 DIAGNOSIS — N2581 Secondary hyperparathyroidism of renal origin: Secondary | ICD-10-CM | POA: Diagnosis not present

## 2016-08-06 DIAGNOSIS — D631 Anemia in chronic kidney disease: Secondary | ICD-10-CM | POA: Diagnosis not present

## 2016-08-08 DIAGNOSIS — N186 End stage renal disease: Secondary | ICD-10-CM | POA: Diagnosis not present

## 2016-08-08 DIAGNOSIS — N2581 Secondary hyperparathyroidism of renal origin: Secondary | ICD-10-CM | POA: Diagnosis not present

## 2016-08-08 DIAGNOSIS — D631 Anemia in chronic kidney disease: Secondary | ICD-10-CM | POA: Diagnosis not present

## 2016-08-11 DIAGNOSIS — N2581 Secondary hyperparathyroidism of renal origin: Secondary | ICD-10-CM | POA: Diagnosis not present

## 2016-08-11 DIAGNOSIS — D631 Anemia in chronic kidney disease: Secondary | ICD-10-CM | POA: Diagnosis not present

## 2016-08-11 DIAGNOSIS — N186 End stage renal disease: Secondary | ICD-10-CM | POA: Diagnosis not present

## 2016-08-13 DIAGNOSIS — I4891 Unspecified atrial fibrillation: Secondary | ICD-10-CM | POA: Diagnosis not present

## 2016-08-13 DIAGNOSIS — D631 Anemia in chronic kidney disease: Secondary | ICD-10-CM | POA: Diagnosis not present

## 2016-08-13 DIAGNOSIS — N2581 Secondary hyperparathyroidism of renal origin: Secondary | ICD-10-CM | POA: Diagnosis not present

## 2016-08-13 DIAGNOSIS — N186 End stage renal disease: Secondary | ICD-10-CM | POA: Diagnosis not present

## 2016-08-13 DIAGNOSIS — E1129 Type 2 diabetes mellitus with other diabetic kidney complication: Secondary | ICD-10-CM | POA: Diagnosis not present

## 2016-08-15 DIAGNOSIS — N2581 Secondary hyperparathyroidism of renal origin: Secondary | ICD-10-CM | POA: Diagnosis not present

## 2016-08-15 DIAGNOSIS — I4891 Unspecified atrial fibrillation: Secondary | ICD-10-CM | POA: Diagnosis not present

## 2016-08-15 DIAGNOSIS — N186 End stage renal disease: Secondary | ICD-10-CM | POA: Diagnosis not present

## 2016-08-15 DIAGNOSIS — D631 Anemia in chronic kidney disease: Secondary | ICD-10-CM | POA: Diagnosis not present

## 2016-08-18 DIAGNOSIS — N186 End stage renal disease: Secondary | ICD-10-CM | POA: Diagnosis not present

## 2016-08-18 DIAGNOSIS — N2581 Secondary hyperparathyroidism of renal origin: Secondary | ICD-10-CM | POA: Diagnosis not present

## 2016-08-18 DIAGNOSIS — D689 Coagulation defect, unspecified: Secondary | ICD-10-CM | POA: Diagnosis not present

## 2016-08-18 DIAGNOSIS — D631 Anemia in chronic kidney disease: Secondary | ICD-10-CM | POA: Diagnosis not present

## 2016-08-20 DIAGNOSIS — D631 Anemia in chronic kidney disease: Secondary | ICD-10-CM | POA: Diagnosis not present

## 2016-08-20 DIAGNOSIS — Z992 Dependence on renal dialysis: Secondary | ICD-10-CM | POA: Diagnosis not present

## 2016-08-20 DIAGNOSIS — I4891 Unspecified atrial fibrillation: Secondary | ICD-10-CM | POA: Diagnosis not present

## 2016-08-20 DIAGNOSIS — I158 Other secondary hypertension: Secondary | ICD-10-CM | POA: Diagnosis not present

## 2016-08-20 DIAGNOSIS — N186 End stage renal disease: Secondary | ICD-10-CM | POA: Diagnosis not present

## 2016-08-20 DIAGNOSIS — N2581 Secondary hyperparathyroidism of renal origin: Secondary | ICD-10-CM | POA: Diagnosis not present

## 2016-08-22 DIAGNOSIS — N2581 Secondary hyperparathyroidism of renal origin: Secondary | ICD-10-CM | POA: Diagnosis not present

## 2016-08-22 DIAGNOSIS — N186 End stage renal disease: Secondary | ICD-10-CM | POA: Diagnosis not present

## 2016-08-22 DIAGNOSIS — D631 Anemia in chronic kidney disease: Secondary | ICD-10-CM | POA: Diagnosis not present

## 2016-08-25 DIAGNOSIS — D631 Anemia in chronic kidney disease: Secondary | ICD-10-CM | POA: Diagnosis not present

## 2016-08-25 DIAGNOSIS — N186 End stage renal disease: Secondary | ICD-10-CM | POA: Diagnosis not present

## 2016-08-25 DIAGNOSIS — N2581 Secondary hyperparathyroidism of renal origin: Secondary | ICD-10-CM | POA: Diagnosis not present

## 2016-08-27 DIAGNOSIS — N2581 Secondary hyperparathyroidism of renal origin: Secondary | ICD-10-CM | POA: Diagnosis not present

## 2016-08-27 DIAGNOSIS — D631 Anemia in chronic kidney disease: Secondary | ICD-10-CM | POA: Diagnosis not present

## 2016-08-27 DIAGNOSIS — I4891 Unspecified atrial fibrillation: Secondary | ICD-10-CM | POA: Diagnosis not present

## 2016-08-27 DIAGNOSIS — N186 End stage renal disease: Secondary | ICD-10-CM | POA: Diagnosis not present

## 2016-08-29 DIAGNOSIS — D631 Anemia in chronic kidney disease: Secondary | ICD-10-CM | POA: Diagnosis not present

## 2016-08-29 DIAGNOSIS — N186 End stage renal disease: Secondary | ICD-10-CM | POA: Diagnosis not present

## 2016-08-29 DIAGNOSIS — N2581 Secondary hyperparathyroidism of renal origin: Secondary | ICD-10-CM | POA: Diagnosis not present

## 2016-09-01 DIAGNOSIS — N186 End stage renal disease: Secondary | ICD-10-CM | POA: Diagnosis not present

## 2016-09-01 DIAGNOSIS — N2581 Secondary hyperparathyroidism of renal origin: Secondary | ICD-10-CM | POA: Diagnosis not present

## 2016-09-01 DIAGNOSIS — D631 Anemia in chronic kidney disease: Secondary | ICD-10-CM | POA: Diagnosis not present

## 2016-09-03 DIAGNOSIS — N2581 Secondary hyperparathyroidism of renal origin: Secondary | ICD-10-CM | POA: Diagnosis not present

## 2016-09-03 DIAGNOSIS — I4891 Unspecified atrial fibrillation: Secondary | ICD-10-CM | POA: Diagnosis not present

## 2016-09-03 DIAGNOSIS — N186 End stage renal disease: Secondary | ICD-10-CM | POA: Diagnosis not present

## 2016-09-03 DIAGNOSIS — D631 Anemia in chronic kidney disease: Secondary | ICD-10-CM | POA: Diagnosis not present

## 2016-09-05 DIAGNOSIS — N186 End stage renal disease: Secondary | ICD-10-CM | POA: Diagnosis not present

## 2016-09-05 DIAGNOSIS — Z7901 Long term (current) use of anticoagulants: Secondary | ICD-10-CM | POA: Diagnosis not present

## 2016-09-05 DIAGNOSIS — N2581 Secondary hyperparathyroidism of renal origin: Secondary | ICD-10-CM | POA: Diagnosis not present

## 2016-09-05 DIAGNOSIS — D631 Anemia in chronic kidney disease: Secondary | ICD-10-CM | POA: Diagnosis not present

## 2016-09-05 DIAGNOSIS — I4891 Unspecified atrial fibrillation: Secondary | ICD-10-CM | POA: Diagnosis not present

## 2016-09-08 DIAGNOSIS — N2581 Secondary hyperparathyroidism of renal origin: Secondary | ICD-10-CM | POA: Diagnosis not present

## 2016-09-08 DIAGNOSIS — N186 End stage renal disease: Secondary | ICD-10-CM | POA: Diagnosis not present

## 2016-09-08 DIAGNOSIS — D631 Anemia in chronic kidney disease: Secondary | ICD-10-CM | POA: Diagnosis not present

## 2016-09-10 DIAGNOSIS — I4891 Unspecified atrial fibrillation: Secondary | ICD-10-CM | POA: Diagnosis not present

## 2016-09-10 DIAGNOSIS — N2581 Secondary hyperparathyroidism of renal origin: Secondary | ICD-10-CM | POA: Diagnosis not present

## 2016-09-10 DIAGNOSIS — N186 End stage renal disease: Secondary | ICD-10-CM | POA: Diagnosis not present

## 2016-09-10 DIAGNOSIS — D631 Anemia in chronic kidney disease: Secondary | ICD-10-CM | POA: Diagnosis not present

## 2016-09-11 DIAGNOSIS — T82858A Stenosis of vascular prosthetic devices, implants and grafts, initial encounter: Secondary | ICD-10-CM | POA: Diagnosis not present

## 2016-09-11 DIAGNOSIS — N186 End stage renal disease: Secondary | ICD-10-CM | POA: Diagnosis not present

## 2016-09-11 DIAGNOSIS — I871 Compression of vein: Secondary | ICD-10-CM | POA: Diagnosis not present

## 2016-09-11 DIAGNOSIS — Z992 Dependence on renal dialysis: Secondary | ICD-10-CM | POA: Diagnosis not present

## 2016-09-12 DIAGNOSIS — N2581 Secondary hyperparathyroidism of renal origin: Secondary | ICD-10-CM | POA: Diagnosis not present

## 2016-09-12 DIAGNOSIS — N186 End stage renal disease: Secondary | ICD-10-CM | POA: Diagnosis not present

## 2016-09-12 DIAGNOSIS — D631 Anemia in chronic kidney disease: Secondary | ICD-10-CM | POA: Diagnosis not present

## 2016-09-15 DIAGNOSIS — D631 Anemia in chronic kidney disease: Secondary | ICD-10-CM | POA: Diagnosis not present

## 2016-09-15 DIAGNOSIS — N186 End stage renal disease: Secondary | ICD-10-CM | POA: Diagnosis not present

## 2016-09-15 DIAGNOSIS — N2581 Secondary hyperparathyroidism of renal origin: Secondary | ICD-10-CM | POA: Diagnosis not present

## 2016-09-17 DIAGNOSIS — N186 End stage renal disease: Secondary | ICD-10-CM | POA: Diagnosis not present

## 2016-09-17 DIAGNOSIS — Z992 Dependence on renal dialysis: Secondary | ICD-10-CM | POA: Diagnosis not present

## 2016-09-17 DIAGNOSIS — N2581 Secondary hyperparathyroidism of renal origin: Secondary | ICD-10-CM | POA: Diagnosis not present

## 2016-09-17 DIAGNOSIS — I4891 Unspecified atrial fibrillation: Secondary | ICD-10-CM | POA: Diagnosis not present

## 2016-09-17 DIAGNOSIS — I158 Other secondary hypertension: Secondary | ICD-10-CM | POA: Diagnosis not present

## 2016-09-17 DIAGNOSIS — D631 Anemia in chronic kidney disease: Secondary | ICD-10-CM | POA: Diagnosis not present

## 2016-09-19 DIAGNOSIS — N2581 Secondary hyperparathyroidism of renal origin: Secondary | ICD-10-CM | POA: Diagnosis not present

## 2016-09-19 DIAGNOSIS — D631 Anemia in chronic kidney disease: Secondary | ICD-10-CM | POA: Diagnosis not present

## 2016-09-19 DIAGNOSIS — N186 End stage renal disease: Secondary | ICD-10-CM | POA: Diagnosis not present

## 2016-09-22 DIAGNOSIS — D631 Anemia in chronic kidney disease: Secondary | ICD-10-CM | POA: Diagnosis not present

## 2016-09-22 DIAGNOSIS — N2581 Secondary hyperparathyroidism of renal origin: Secondary | ICD-10-CM | POA: Diagnosis not present

## 2016-09-22 DIAGNOSIS — N186 End stage renal disease: Secondary | ICD-10-CM | POA: Diagnosis not present

## 2016-09-24 DIAGNOSIS — N186 End stage renal disease: Secondary | ICD-10-CM | POA: Diagnosis not present

## 2016-09-24 DIAGNOSIS — D631 Anemia in chronic kidney disease: Secondary | ICD-10-CM | POA: Diagnosis not present

## 2016-09-24 DIAGNOSIS — N2581 Secondary hyperparathyroidism of renal origin: Secondary | ICD-10-CM | POA: Diagnosis not present

## 2016-09-24 DIAGNOSIS — I4891 Unspecified atrial fibrillation: Secondary | ICD-10-CM | POA: Diagnosis not present

## 2016-09-26 DIAGNOSIS — D631 Anemia in chronic kidney disease: Secondary | ICD-10-CM | POA: Diagnosis not present

## 2016-09-26 DIAGNOSIS — N2581 Secondary hyperparathyroidism of renal origin: Secondary | ICD-10-CM | POA: Diagnosis not present

## 2016-09-26 DIAGNOSIS — N186 End stage renal disease: Secondary | ICD-10-CM | POA: Diagnosis not present

## 2016-09-29 DIAGNOSIS — N2581 Secondary hyperparathyroidism of renal origin: Secondary | ICD-10-CM | POA: Diagnosis not present

## 2016-09-29 DIAGNOSIS — D631 Anemia in chronic kidney disease: Secondary | ICD-10-CM | POA: Diagnosis not present

## 2016-09-29 DIAGNOSIS — N186 End stage renal disease: Secondary | ICD-10-CM | POA: Diagnosis not present

## 2016-10-01 DIAGNOSIS — N186 End stage renal disease: Secondary | ICD-10-CM | POA: Diagnosis not present

## 2016-10-01 DIAGNOSIS — D631 Anemia in chronic kidney disease: Secondary | ICD-10-CM | POA: Diagnosis not present

## 2016-10-01 DIAGNOSIS — I4891 Unspecified atrial fibrillation: Secondary | ICD-10-CM | POA: Diagnosis not present

## 2016-10-01 DIAGNOSIS — N2581 Secondary hyperparathyroidism of renal origin: Secondary | ICD-10-CM | POA: Diagnosis not present

## 2016-10-03 DIAGNOSIS — D631 Anemia in chronic kidney disease: Secondary | ICD-10-CM | POA: Diagnosis not present

## 2016-10-03 DIAGNOSIS — N2581 Secondary hyperparathyroidism of renal origin: Secondary | ICD-10-CM | POA: Diagnosis not present

## 2016-10-03 DIAGNOSIS — N186 End stage renal disease: Secondary | ICD-10-CM | POA: Diagnosis not present

## 2016-10-06 DIAGNOSIS — N186 End stage renal disease: Secondary | ICD-10-CM | POA: Diagnosis not present

## 2016-10-06 DIAGNOSIS — D631 Anemia in chronic kidney disease: Secondary | ICD-10-CM | POA: Diagnosis not present

## 2016-10-06 DIAGNOSIS — N2581 Secondary hyperparathyroidism of renal origin: Secondary | ICD-10-CM | POA: Diagnosis not present

## 2016-10-08 DIAGNOSIS — I4891 Unspecified atrial fibrillation: Secondary | ICD-10-CM | POA: Diagnosis not present

## 2016-10-08 DIAGNOSIS — N2581 Secondary hyperparathyroidism of renal origin: Secondary | ICD-10-CM | POA: Diagnosis not present

## 2016-10-08 DIAGNOSIS — N186 End stage renal disease: Secondary | ICD-10-CM | POA: Diagnosis not present

## 2016-10-08 DIAGNOSIS — D631 Anemia in chronic kidney disease: Secondary | ICD-10-CM | POA: Diagnosis not present

## 2016-10-10 DIAGNOSIS — N186 End stage renal disease: Secondary | ICD-10-CM | POA: Diagnosis not present

## 2016-10-10 DIAGNOSIS — D631 Anemia in chronic kidney disease: Secondary | ICD-10-CM | POA: Diagnosis not present

## 2016-10-10 DIAGNOSIS — N2581 Secondary hyperparathyroidism of renal origin: Secondary | ICD-10-CM | POA: Diagnosis not present

## 2016-10-13 DIAGNOSIS — N19 Unspecified kidney failure: Secondary | ICD-10-CM | POA: Diagnosis not present

## 2016-10-13 DIAGNOSIS — N2581 Secondary hyperparathyroidism of renal origin: Secondary | ICD-10-CM | POA: Diagnosis not present

## 2016-10-13 DIAGNOSIS — Z992 Dependence on renal dialysis: Secondary | ICD-10-CM | POA: Diagnosis not present

## 2016-10-13 DIAGNOSIS — Z882 Allergy status to sulfonamides status: Secondary | ICD-10-CM | POA: Diagnosis not present

## 2016-10-13 DIAGNOSIS — N186 End stage renal disease: Secondary | ICD-10-CM | POA: Diagnosis not present

## 2016-10-13 DIAGNOSIS — J45909 Unspecified asthma, uncomplicated: Secondary | ICD-10-CM | POA: Diagnosis not present

## 2016-10-13 DIAGNOSIS — G4733 Obstructive sleep apnea (adult) (pediatric): Secondary | ICD-10-CM | POA: Diagnosis not present

## 2016-10-13 DIAGNOSIS — Z888 Allergy status to other drugs, medicaments and biological substances status: Secondary | ICD-10-CM | POA: Diagnosis not present

## 2016-10-13 DIAGNOSIS — I10 Essential (primary) hypertension: Secondary | ICD-10-CM | POA: Diagnosis not present

## 2016-10-13 DIAGNOSIS — Z880 Allergy status to penicillin: Secondary | ICD-10-CM | POA: Diagnosis not present

## 2016-10-13 DIAGNOSIS — M47817 Spondylosis without myelopathy or radiculopathy, lumbosacral region: Secondary | ICD-10-CM | POA: Diagnosis not present

## 2016-10-13 DIAGNOSIS — I4891 Unspecified atrial fibrillation: Secondary | ICD-10-CM | POA: Diagnosis not present

## 2016-10-13 DIAGNOSIS — D631 Anemia in chronic kidney disease: Secondary | ICD-10-CM | POA: Diagnosis not present

## 2016-10-13 DIAGNOSIS — Z91040 Latex allergy status: Secondary | ICD-10-CM | POA: Diagnosis not present

## 2016-10-13 DIAGNOSIS — E215 Disorder of parathyroid gland, unspecified: Secondary | ICD-10-CM | POA: Diagnosis not present

## 2016-10-15 DIAGNOSIS — N2581 Secondary hyperparathyroidism of renal origin: Secondary | ICD-10-CM | POA: Diagnosis not present

## 2016-10-15 DIAGNOSIS — I4891 Unspecified atrial fibrillation: Secondary | ICD-10-CM | POA: Diagnosis not present

## 2016-10-15 DIAGNOSIS — D631 Anemia in chronic kidney disease: Secondary | ICD-10-CM | POA: Diagnosis not present

## 2016-10-15 DIAGNOSIS — N186 End stage renal disease: Secondary | ICD-10-CM | POA: Diagnosis not present

## 2016-10-15 NOTE — Progress Notes (Signed)
HPI:   Jane [name] is a 71 y.o. female, who is here today with her daughter to establish care.  Former PCP: Dr Shelton Silvas   Chronic medical problems: Atrial fib on Coumadin,HTN,ESRD (on hemodialysis MWF, follows with nephrologists weekly),chronic pain (back pain), asthma with chronic cough, OSA (she is not on CPAP,did not tolerate it well), and anemia among some. She follows with cardiologists, Dr Quentin Cornwall, will continue following with Dr Leavy Cella appt in 10/2016.  She has followed with GI because rectal bleeding  She follows with Dr Jannet Askew management,last seen 09/24/15. Hx of lower back pain with sciatic, has recently undergone nerve ablation (10/13/16) and planning on having another one on right side in 10/2016.    -08/04/16 she was evaluated in the ER because RLQ pain, resolved. S/P sigmoid colectomy due to diverticulosis, 06/2016.  Lab Results  Component Value Date   WBC 5.8 08/04/2016   HGB 10.1 (L) 08/04/2016   HCT 32.2 (L) 08/04/2016   MCV 92.5 08/04/2016   PLT 154 08/04/2016   Lab Results  Component Value Date   CREATININE 6.44 (H) 08/04/2016   BUN 28 (H) 08/04/2016   NA 139 08/04/2016   K 4.9 08/04/2016   CL 104 08/04/2016   CO2 22 08/04/2016   Abdominal CT 08/04/16:  1. Status post sigmoid colectomy, without acute complication. 2. Small volume cul-de-sac fluid, nonspecific. 3.  Possible constipation. 4.  Coronary artery atherosclerosis. Aortic atherosclerosis. 5. Anterior pelvic wall bilobed fluid collection is most likely a postoperative seroma or hematoma.   Last bowel movement was yesterday. She takes Colace and Miralax, which are not helping much.   She lives with daughter. ADL's and AIDL's mostly independent,has a cane that she is not using. She does not drive.   HTN: Concerned about elevated BP's. "Bad headache" with elevated BP's right before hemodialysis and resolves after hemodialysis. Fronto-temporal-parietal, no associated visual  changes,nausea,vomiting,or focal deficit. She is on Losartan 100 mg and Carvedilol 50 mg bid.  She does not follow a low salt diet. According to daughter,she eats a jar of pickles per week.  Ham sandwiches.   BP readings: 157/60 earlier today, 137/60. BP has been as high as 200/90 and as low as 80/90 after dialysis. According to pt, sometimes she needs IVF after dialysis.  -Hearing loss reported by daughter,she denies any hearing problem. Daughter states that usually has TV volume up. Denies earache or tinnitus.  -Today she is requesting refills on some of her medications: Amiodarone, she does not have enough until next appt with cardiologists.   Review of Systems  Constitutional: Positive for fatigue. Negative for activity change, appetite change, fever and unexpected weight change.  HENT: Positive for hearing loss. Negative for ear pain, mouth sores, nosebleeds, sore throat and trouble swallowing.   Eyes: Negative for redness and visual disturbance.  Respiratory: Negative for cough, shortness of breath and wheezing.   Cardiovascular: Positive for leg swelling (no more than usual). Negative for chest pain and palpitations.  Gastrointestinal: Negative for abdominal pain, nausea and vomiting.       Negative for changes in bowel habits.  Genitourinary: Negative for decreased urine volume and hematuria.  Musculoskeletal: Positive for arthralgias and back pain.  Skin: Negative for rash.  Allergic/Immunologic: Positive for environmental allergies.  Neurological: Positive for headaches. Negative for syncope, facial asymmetry and weakness.  Psychiatric/Behavioral: Positive for sleep disturbance (wakes up frequently,chronic). Negative for confusion. The patient is nervous/anxious.       Current Outpatient Prescriptions on  File Prior to Visit  Medication Sig Dispense Refill  . albuterol (PROVENTIL HFA;VENTOLIN HFA) 108 (90 Base) MCG/ACT inhaler Inhale 1 puff into the lungs every 6 (six)  hours as needed for wheezing or shortness of breath.    . Amino Acids-Protein Hydrolys (FEEDING SUPPLEMENT, PRO-STAT SUGAR FREE 64,) LIQD Take 30 mLs by mouth 2 (two) times daily. 900 mL 0  . Biotin 10000 MCG TABS Take 1 tablet by mouth daily.    . cinacalcet (SENSIPAR) 30 MG tablet Take 30 mg by mouth daily.    Marland Kitchen docusate sodium (PHILLIPS STOOL SOFTENER) 100 MG capsule Take 200 mg by mouth 2 (two) times daily.    . multivitamin (RENA-VIT) TABS tablet Take 1 tablet by mouth daily.    Mar[place] Kitchen oxyCODONE-acetaminophen (PERCOCET/ROXICET) 5-325 MG tablet Take 1-2 tablets by mouth every 6 (six) hours as needed for severe pain. 15 tablet 0  . polyethylene glycol powder (GLYCOLAX/MIRALAX) powder Take 17 g by mouth every other day. [place]    . Probiotic Product (PROBIOTIC PO) Take 1 tablet by mouth daily.    . sevelamer carbonate (RENVELA) 800 MG tablet Take 800-1,600 mg by mouth See admin instructions. 1,600 mg after meals TWO times a day and 800 mg after each snack    . warfarin (COUMADIN) 2.5 MG tablet Take 1 tablet (2.5 mg total) by mouth daily at 6 PM. (Patient taking differently: Take 5 mg by mouth daily at 6 PM. ) 30 tablet 0   No current facility-administered medications on file prior to visit.      Past Medical History:  Diagnosis Date  . Acid reflux   . Atrial fibrillation ([place])   . Hypertension   . Renal disorder   . Sleep apnea    Allergies  Allergen Reactions  . Tape Other (See Comments)    PLASTIC TAPE TEARS OFF THE SKIN AND BRUISES IT TERRIBLY!!  . Latex Rash    Family History  Problem Relation Age of Onset  . Heart failure Mother   . Stroke Mother   . Other Father     Social History   Social History  . Marital status: Single    Spouse name: N/A  . Number of children: N/A  . Years of education: N/A   Social History Main Topics  . Smoking status: Never Smoker  . Smokeless tobacco: Never Used  . Alcohol use No  . Drug use: No  . Sexual activity: No   Other  Topics Concern  . None   Social History Narrative  . None    Vitals:   10/16/16 0744  BP: 130/82  Pulse: 65  Resp: 12   O2 sat 94% at RA. Body mass index is 39.46 kg/m.   Physical Exam  Constitutional: She is oriented to person, place, and time. She appears well-developed. No distress.  HENT:  Head: Atraumatic.  Right Ear: Tympanic membrane, external ear and ear canal normal.  Left Ear: External ear normal.  Mouth/Throat: Oropharynx is clear and moist and mucous membranes are normal.  Gross hearing intact, left cerumen excess,could not see TM.  Eyes: Conjunctivae and EOM are normal. Pupils are equal, round, and reactive to light.  Cardiovascular: Normal rate and regular rhythm.   Murmur (SEM I/VI RUSB) heard. Pulses:      Dorsalis pedis pulses are 2+ on the right side, and 2+ on the left side.  Respiratory: Effort normal and breath sounds normal. No respiratory distress.  GI: Soft. She exhibits no mass.  There is no hepatomegaly. There is no tenderness.  Musculoskeletal: She exhibits edema (Trace pitting LE edema,bilateral).  Lymphadenopathy:    She has no cervical adenopathy.  Neurological: She is alert and oriented to person, place, and time. She has normal strength. Coordination normal.  Slow, otherwise stable gait with no assistance.  Skin: Skin is warm. No erythema.  Psychiatric: She has a normal mood and affect.  Well groomed, good eye contact.      ASSESSMENT AND PLAN:    Yamina was seen today for establish care and medicare wellness.  Diagnoses and all orders for this visit:  Constipation, unspecified constipation type  Adequate fiber and fluid intake recommended. Continue Colace 100 mg bid. OTC Bisacodyl 5 mg daily as needed. Instructed about warning signs. If not better will consider Linzess.   Hypertension with renal disease  Today BP otherwise adequate. Low salt diet discussed and strongly recommended. Possible complications of elevated BP  as well as hypotension discussed.So for now no changes in current management. Periodic eye examination. F/U in 3-4 months.   -     losartan (COZAAR) 100 MG tablet; Take 1 tablet (100 mg total) by mouth daily. -     carvedilol (COREG) 25 MG tablet; Take 2 tablets (50 mg total) by mouth 2 (two) times daily with a meal.  Paroxysmal atrial fibrillation ([facility])  Today rhythm and rate controlled. Keep appt with Dr Einar Gip. No changes in Carvedilol and Amiodarone.   -     amiodarone (PACERONE) 200 MG tablet; Take 1 tablet (200 mg total) by mouth daily.  Bilateral hearing loss, unspecified hearing loss type  Hearing screening otherwise normal. Avoid Q tips. OTC Debrox in left ear, will consider ear lavage next OV.  Class 2 obesity due to excess calories with serious comorbidity and body mass index (BMI) of 39.0 to 39.9 in adult  We discussed benefits of wt loss as well as adverse effects of obesity. Consistency with healthy diet and physical activity recommended. Label review, limit bread intake is a good start. F/U in 3 months.  Breast cancer screening -     MM DIGITAL SCREENING BILATERAL; Future     Dalesha Stanback G. [name], MD  [facility]. [place] office.

## 2016-10-16 ENCOUNTER — Ambulatory Visit (INDEPENDENT_AMBULATORY_CARE_PROVIDER_SITE_OTHER): Payer: Medicare Other | Admitting: Family Medicine

## 2016-10-16 ENCOUNTER — Encounter: Payer: Self-pay | Admitting: Family Medicine

## 2016-10-16 ENCOUNTER — Telehealth: Payer: Self-pay

## 2016-10-16 VITALS — BP 130/82 | HR 65 | Resp 12 | Ht 65.0 in | Wt 237.1 lb

## 2016-10-16 DIAGNOSIS — K5900 Constipation, unspecified: Secondary | ICD-10-CM

## 2016-10-16 DIAGNOSIS — Z1239 Encounter for other screening for malignant neoplasm of breast: Secondary | ICD-10-CM

## 2016-10-16 DIAGNOSIS — Z6839 Body mass index (BMI) 39.0-39.9, adult: Secondary | ICD-10-CM

## 2016-10-16 DIAGNOSIS — E6609 Other obesity due to excess calories: Secondary | ICD-10-CM

## 2016-10-16 DIAGNOSIS — Z1211 Encounter for screening for malignant neoplasm of colon: Secondary | ICD-10-CM | POA: Diagnosis not present

## 2016-10-16 DIAGNOSIS — I10 Essential (primary) hypertension: Secondary | ICD-10-CM

## 2016-10-16 DIAGNOSIS — I480 Paroxysmal atrial fibrillation: Secondary | ICD-10-CM | POA: Diagnosis not present

## 2016-10-16 DIAGNOSIS — I129 Hypertensive chronic kidney disease with stage 1 through stage 4 chronic kidney disease, or unspecified chronic kidney disease: Secondary | ICD-10-CM

## 2016-10-16 DIAGNOSIS — Z1231 Encounter for screening mammogram for malignant neoplasm of breast: Secondary | ICD-10-CM

## 2016-10-16 DIAGNOSIS — H9193 Unspecified hearing loss, bilateral: Secondary | ICD-10-CM

## 2016-10-16 DIAGNOSIS — IMO0001 Reserved for inherently not codable concepts without codable children: Secondary | ICD-10-CM

## 2016-10-16 MED ORDER — LOSARTAN POTASSIUM 100 MG PO TABS: 100.0000 mg | ORAL_TABLET | Freq: Every day | ORAL | 1 refills | Status: DC

## 2016-10-16 MED ORDER — AMIODARONE HCL 200 MG PO TABS: 200.0000 mg | ORAL_TABLET | Freq: Every day | ORAL | 1 refills | Status: DC

## 2016-10-16 MED ORDER — CARVEDILOL 25 MG PO TABS: 50.0000 mg | ORAL_TABLET | Freq: Two times a day (BID) | ORAL | 2 refills | Status: DC

## 2016-10-16 NOTE — Progress Notes (Signed)
Pre visit review using our clinic review tool, if applicable. No additional management support is needed unless otherwise documented below in the visit note. 

## 2016-10-16 NOTE — Progress Notes (Signed)
Subjective:   Jane [name] is a 71 y.o. female who presents for Medicare Annual (Subsequent) preventive examination.  The Patient was informed that the wellness visit is to identify future health risk and educate and initiate measures that can reduce risk for increased disease through the lifespan.    NO ROS; Medicare Wellness Visit  Describes health as good, fair or great? Good Had dialysis treatments 3 times a week ;  Back and knee pain  Declines kidney transplant/ does not want to go through surgery   Preventive Screening -Counseling & Management  To fup on medical records for dates of Preventive health  Smoking history never smoked  Smokeless tobacco  Second Hand Smoke status; No Smokers in the home ETOH - no   Medication adherence or issues? Not at this time   RISK FACTORS Diet Limited diet due to dialysis Leave the salt alone; and gets sick when eating protein  She did eat pickles which helped to control nausea but now she can't  Eats beans and dairy; The Dietician told her to eat beans for protein   Regular exercise  Has more energy on the days she does not go to dialysis Just moved here x 1 year ago To Connect with friend at [employer]  Given resources for the community   Cardiac Risk Factors:  Advanced aged >65 in women HTN 1990 was dx with high BP  Hyperlipidemia well managed  Diabetes neg  Family History htn  Obesity  BMI 17   Fall risk; about one month ago Had been to dialysis; got dizzy  Lives with dtr; Was in [place]  Bedroom has one level Has bath  room by herself  no issues with am care    Long term goal may be to move closer to town where she can get out and go on her days off dialysis. Misses her independence Given resources to explore living opportunities in [place]   Mobility of Functional changes this year? Move has impacted her as she has to get out and meet people  Does not drive  Would love to be close to shopping; pharmacy  etc   Mental Health:  Any emotional problems? Anxious, depressed, irritable, sad or blue? Challenge when in pain but manages Denies feeling depressed or hopeless; voices pleasure in daily life How many social activities have you been engaged in within the last 2 weeks? Not engaging as yet but states she will do so     Hearing Screening   125Hz  250Hz  500Hz  1000Hz  2000Hz  3000Hz  4000Hz  6000Hz  8000Hz   Right ear:    Pass Pass  Pass    Left ear:   Pass Pass Pass  Pass    Vision Screening Comments: Vision checks every 2 years Just had her eyes checked Dr. Donato Heinz No issues with eyes astigmitism   Activities of Daily Living - See functional screen   Cognitive testing; Ad8 score; 0 or less than 2  MMSE deferred or completed if AD8 + 2 issues    Advanced Directive; Reviewed advanced directive and agreed to receipt of information and discussion.  Focused face to face x  20 minutes discussing HCPOA and Living will and reviewed all the questions in the [place] forms. The patient voices understanding of HCPOA; LW reviewed and information provided on each question. Educated on how to revoke this HCPOA or LW at any time.  Dtr is a Engineer, building services Also  discussed life prolonging measures (given a few examples)  and where she could choose to initiate or not;  the ability to given the HCPOA power to change her living will or not if she cannot speak for herself; as well as finalizing the will by 2 unrelated witnesses and notary.  Will call for questions and given information on [facility] pastoral department for further assistance.  Will try to complete AD; Given copy  Referred to [facility] for questions [place] offers free advance directive forms, as well as assistance in completing the forms themselves. For assistance, contact the Spiritual Care Department at [phone], or the Clinical Social Work Department at [phone].   Patient Care Team: [facility] as PCP -  General Dr. [name]   Immunization History  Administered Date(s) Administered  . Influenza-Unspecified 04/20/2016   Required Immunizations needed today  Screening test up to date or reviewed for plan of completion Health Maintenance Due  Topic Date Due  . Hepatitis C Screening  Jun 17, 1946  . TETANUS/TDAP  06/13/1963  . MAMMOGRAM  06/12/1994  . COLONOSCOPY  06/12/1994  . DEXA SCAN  06/12/2009  . PNA vac Low Risk Adult (1 of 2 - PCV13) 06/12/2009  . INFLUENZA VACCINE  02/19/2016   Colonoscopy 10/18/2015 WF due 09/2018  Bone Density - states she had one since 63   Flu vaccine 04/21/2016 Prevnar 13- 07/20/2015 PSV 23 10/12/2012 No tetanus on record  States Hep c was completed at [facility] last year Agrees mammogram is needed; Agrees to order placed at the breast center and they will call and schedule.      Objective:     Vitals: BP 130/82 (BP Location: Left Wrist)   Pulse 65   Resp 12   Ht 5\' 5"  (1.651 m)   Wt 237 lb 2 oz (107.6 kg)   SpO2 94%   BMI 39.46 kg/m   Body mass index is 39.46 kg/m.   Tobacco History  Smoking Status  . Never Smoker  Smokeless Tobacco  . Never Used     Counseling given: Yes   Past Medical History:  Diagnosis Date  . Acid reflux   . Atrial fibrillation ([place])   . Hypertension   . Renal disorder   . Sleep apnea    Past Surgical History:  Procedure Laterality Date  . IR GENERIC HISTORICAL  07/09/2016   IR US GUIDE VASC ACCESS RIGHT 07/09/2016 Arne Cleveland, MD [facility]  . IR GENERIC HISTORICAL  07/09/2016   IR FLUORO GUIDE CV LINE RIGHT 07/09/2016 Arne Cleveland, MD [facility]  . KNEE ARTHROPLASTY    . LAPAROSCOPIC SIGMOID COLECTOMY N/A 07/11/2016   Procedure: LAPAROSCOPIC SIGMOID COLECTOMY;  Surgeon: Clovis Riley, MD;  Location: [facility];  Service: General;  Laterality: N/A;  . TUBAL LIGATION     Family History  Problem Relation Age of Onset  . Heart failure Mother   . Stroke Mother   . Other Father    History    Sexual Activity  . Sexual activity: No    Outpatient Encounter Prescriptions as of 10/16/2016  Medication Sig  . albuterol (PROVENTIL HFA;VENTOLIN HFA) 108 (90 Base) MCG/ACT inhaler Inhale 1 puff into the lungs every 6 (six) hours as needed for wheezing or shortness of breath.  . Amino Acids-Protein Hydrolys (FEEDING SUPPLEMENT, PRO-STAT SUGAR FREE 64,) LIQD Take 30 mLs by mouth 2 (two) times daily.  Marland Kitchen amiodarone (PACERONE) 200 MG tablet Take 1 tablet (200 mg total) by mouth daily.  . Biotin 10000 MCG TABS Take 1 tablet by  mouth daily.  . carvedilol (COREG) 25 MG tablet Take 2 tablets (50 mg total) by mouth 2 (two) times daily with a meal.  . cinacalcet (SENSIPAR) 30 MG tablet Take 30 mg by mouth daily.  Marland Kitchen docusate sodium (PHILLIPS STOOL SOFTENER) 100 MG capsule Take 200 mg by mouth 2 (two) times daily.  Marland Kitchen losartan (COZAAR) 100 MG tablet Take 1 tablet (100 mg total) by mouth daily.  . multivitamin (RENA-VIT) TABS tablet Take 1 tablet by mouth daily.  Marland Kitchen oxyCODONE-acetaminophen (PERCOCET/ROXICET) 5-325 MG tablet Take 1-2 tablets by mouth every 6 (six) hours as needed for severe pain.  . polyethylene glycol powder (GLYCOLAX/MIRALAX) powder Take 17 g by mouth every other day. [place]  . Probiotic Product (PROBIOTIC PO) Take 1 tablet by mouth daily.  . sevelamer carbonate (RENVELA) 800 MG tablet Take 800-1,600 mg by mouth See admin instructions. 1,600 mg after meals TWO times a day and 800 mg after each snack  . warfarin (COUMADIN) 2.5 MG tablet Take 1 tablet (2.5 mg total) by mouth daily at 6 PM. (Patient taking differently: Take 5 mg by mouth daily at 6 PM. )  . [DISCONTINUED] amiodarone (PACERONE) 200 MG tablet Take 200 mg by mouth daily.  . [DISCONTINUED] carvedilol (COREG) 25 MG tablet Take 50 mg by mouth 2 (two) times daily with a meal.  . [DISCONTINUED] fluconazole (DIFLUCAN) 100 MG tablet Take 100 mg by mouth daily.  . [DISCONTINUED] losartan (COZAAR) 100 MG tablet Take 100 mg by  mouth daily.   No facility-administered encounter medications on file as of 10/16/2016.     Activities of Daily Living In your present state of health, do you have any difficulty performing the following activities: 10/16/2016 06/25/2016  Hearing? N N  Vision? N N  Difficulty concentrating or making decisions? N N  Walking or climbing stairs? N Y  Dressing or bathing? N N  Doing errands, shopping? N Y  Conservation officer, nature and eating ? N -  Using the Toilet? N -  In the past six months, have you accidently leaked urine? N -  Do you have problems with loss of bowel control? N -  Managing your Medications? N -  Managing your Finances? N -  Housekeeping or managing your Housekeeping? N -  Some recent data might be hidden    Patient Care Team: [facility] as PCP - General    Assessment:     Exercise Activities and Dietary recommendations    Goals    . patient          Diabetes and weight loss; [facility]  Phone: [phone]   [place] [facility]; [phone] [address] [place]; [phone] Get resource to get information on any and all community programs for Seniors  [place]: [phone] Community Health Response Program -[phone] Public Health Dept; Need to be a skilled visit but can assist with bathing as well; [phone]  Call the Sr. Line to ask what resources or activities may be available in your area  Dept of Social Services; Call [phone] and ask for SW on call  Options for Medicaid include the Community [employer] program; [place] (personal care services) or PACE program, which is a medical and social program combined   Deaf & Hard of Hearing Division Services - can assist with hearing aid x 1  No reviews  [facility]  [phone]  [phone] [place]  Fall Risk Fall Risk  10/16/2016  Falls in the past year? Yes  Number falls in past yr: 1  Follow up Education  provided   Depression Screen PHQ 2/9 Scores 10/16/2016  PHQ - 2 Score 0     Cognitive Function MMSE - Mini Mental State Exam 10/16/2016  Not completed: (No Data)        Immunization History  Administered Date(s) Administered  . Influenza-Unspecified 04/20/2016   Screening Tests Health Maintenance  Topic Date Due  . Hepatitis C Screening  [birth year]  . TETANUS/TDAP  06/13/1963  . MAMMOGRAM  06/12/1994  . COLONOSCOPY  06/12/1994  . DEXA SCAN  06/12/2009  . PNA vac Low Risk Adult (1 of 2 - PCV13) 06/12/2009  . INFLUENZA VACCINE  02/19/2016      Plan:      PCP Notes  Health Maintenance Colonoscopy 10/18/2015 WF due 09/2018  Bone Density - states she had one since 63   Flu vaccine 04/21/2016 Prevnar 13- 07/20/2015 PSV 23 10/12/2012 No tetanus on record  States Hep c was completed at [facility] last year Agrees mammogram is needed; Agrees to order placed at the breast center and they will call and schedule. Order for mammogram pended   Abnormal Screens - hearing; given information on a free hearing aid if she needs one  Referrals none  Patient concerns; Mostly getting engaged in the community  Resources given May consider other community senior living to be close to the city center; shopping etc as she does not drive  (has decided she does not want a kidney transplant today)   Nurse Concerns;  Will try to review records to document preventive health   Next PCP apt apt today and again in 8 weeks   During the course of the visit the patient was educated and counseled about the following appropriate screening and preventive services:   Vaccines to include Pneumoccal, Influenza, Hepatitis B, Td, Zostavax, HCV  Electrocardiogram  Cardiovascular Disease  Colorectal cancer screening  Bone density screening deferred for now; not sure when one was done; will discuss with Dr. [name]   Diabetes screening  Glaucoma screening  Mammography/PAP  Nutrition counseling    Patient Instructions (the written plan) was given to the patient.   IRJJO,ACZYS, RN  10/16/2016   I have reviewed documentation from this visit and I agree with recommendations given.  [name]. [name], MD  [facility]. [place] office.

## 2016-10-16 NOTE — Telephone Encounter (Signed)
Dr. [name],  Just Colton on update for preventive health: Call placed to the dialysis center and reviewed records: Per D[facility] [address]:  Flu vaccine 04/21/2016 - doc Prevnar 13 07/20/2015 - doc PSV 23 10/12/2012 - doc Colonoscopy per [facility] 10/08/2015 repeat 3 years- doc  -Confirmed no td given at dialysis center -Can not confirm dexa on medical records submitted   although the patient states she has had one   Not sure when -The patient states she had hep c at [facility] last year but could not confirm this in labs rec'd.   I have ordered mammogram with the breast center Any other recommendations? Please advise. [name]

## 2016-10-16 NOTE — Patient Instructions (Addendum)
A few things to remember from today's visit:   Essential hypertension - Plan: losartan (COZAAR) 100 MG tablet, carvedilol (COREG) 25 MG tablet  Paroxysmal atrial fibrillation (HCC) - Plan: amiodarone (PACERONE) 200 MG tablet  Constipation, unspecified constipation type  Bilateral hearing loss, unspecified hearing loss type  Blood pressure goal for most people is less than 140/90. Some populations (older than 60) the goal is less than 150/90.  Most recent cardiologists' recommendations recommend blood pressure at or less than 130/80.   Elevated blood pressure increases the risk of strokes, heart and kidney disease, and eye problems. Regular physical activity and a healthy diet (DASH diet) usually help. Low salt diet. Take medications as instructed.  Caution with some over the counter medications as cold medications, dietary products (for weight loss), and Ibuprofen or Aleve (frequent use);all these medications could cause elevation of blood pressure. DASH diet recommended: high in vegetables, fruits, low-fat dairy products, whole grains, poultry, fish, and nuts; and low in sweets, sugar-sweetened beverages, and red meats.   Constipation: Colace and Bisacodyl. Adequate hydration and fiber.    Please be sure medication list is accurate. If a new problem present, please set up appointment sooner than planned today.   Jane [name] , Thank you for taking time to come for your Medicare Wellness Visit. I appreciate your ongoing commitment to your health goals. Please review the following plan we discussed and let me know if I can assist you in the future.   Jane [name] will updated health prevention today and will call you at some point next week with game plan or questions.  Will try to complete AD; Given copy  Referred to [facility] for questions [place] offers free advance directive forms, as well as assistance in completing the forms themselves. For assistance, contact the Spiritual Care  Department at [phone], or the Clinical Social Work Department at [phone].  Per Dialysis center on [address]: Flu vaccine 04/21/2016 Prevnar 13 07/20/2015 PSV 23 10/12/2012 Colonoscopy per [facility] 10/08/2015 Confirmed no td given at dialysis center Can confirm dexa on medical records submitted although the patient states she has had one Not sure when The patient states she had hep c at [facility] last year but could not confirm this in labs rec'd.   These are the goals we discussed: Goals    . patient          Diabetes and weight loss; [facility]  Phone: [phone]   [place] [facility]; [phone] Sr. [place]; 4588451128 Get resource to get information on any and all community programs for Seniors  [place]: ([phone])326-7814 Community Health Response Program -627-035-0093 Public Health Dept; Need to be a skilled visit but can assist with bathing as well; 726-472-2300  Call the Sr. Line to ask what resources or activities may be available in your area  Dept of Social Services; Call ([phone] and ask for SW on call  Options for Medicaid include the [employer] program; [facility] (personal care services) or PACE program, which is a medical and social program combined   Deaf & Hard of Hearing Division Services - can assist with hearing aid x 1  No reviews  [facility]  [place] [address]  [phone] Jane [name]          This is a list of the screening recommended for you and due dates:  Health Maintenance  Topic Date Due  .  Hepatitis C: One time screening is recommended by  Center for Disease Control  (CDC) for  adults born from [birth year] through 1967.   1946/04/18  . Tetanus Vaccine  06/13/1963  . Mammogram  06/12/1994  . Colon Cancer Screening  06/12/1994  . DEXA scan (bone density measurement)  06/12/2009  . Pneumonia vaccines (1 of 2 - PCV13) 06/12/2009  . Flu Shot  02/19/2016    Health  Maintenance for Postmenopausal Women Menopause is a normal process in which your reproductive ability comes to an end. This process happens gradually over a span of months to years, usually between the ages of 65 and 13. Menopause is complete when you have missed 12 consecutive menstrual periods. It is important to talk with your health care provider about some of the most common conditions that affect postmenopausal women, such as heart disease, cancer, and bone loss (osteoporosis). Adopting a healthy lifestyle and getting preventive care can help to promote your health and wellness. Those actions can also lower your chances of developing some of these common conditions. What should I know about menopause? During menopause, you may experience a number of symptoms, such as:  Moderate-to-severe hot flashes.  Night sweats.  Decrease in sex drive.  Mood swings.  Headaches.  Tiredness.  Irritability.  Memory problems.  Insomnia. Choosing to treat or not to treat menopausal changes is an individual decision that you make with your health care provider. What should I know about hormone replacement therapy and supplements? Hormone therapy products are effective for treating symptoms that are associated with menopause, such as hot flashes and night sweats. Hormone replacement carries certain risks, especially as you become older. If you are thinking about using estrogen or estrogen with progestin treatments, discuss the benefits and risks with your health care provider. What should I know about heart disease and stroke? Heart disease, heart attack, and stroke become more likely as you age. This may be due, in part, to the hormonal changes that your body experiences during menopause. These can affect how your body processes dietary fats, triglycerides, and cholesterol. Heart attack and stroke are both medical emergencies. There are many things that you can do to help prevent heart disease and  stroke:  Have your blood pressure checked at least every 1-2 years. High blood pressure causes heart disease and increases the risk of stroke.  If you are 61-70 years old, ask your health care provider if you should take aspirin to prevent a heart attack or a stroke.  Do not use any tobacco products, including cigarettes, chewing tobacco, or electronic cigarettes. If you need help quitting, ask your health care provider.  It is important to eat a healthy diet and maintain a healthy weight.  Be sure to include plenty of vegetables, fruits, low-fat dairy products, and lean protein.  Avoid eating foods that are high in solid fats, added sugars, or salt (sodium).  Get regular exercise. This is one of the most important things that you can do for your health.  Try to exercise for at least 150 minutes each week. The type of exercise that you do should increase your heart rate and make you sweat. This is known as moderate-intensity exercise.  Try to do strengthening exercises at least twice each week. Do these in addition to the moderate-intensity exercise.  Know your numbers.Ask your health care provider to check your cholesterol and your blood glucose. Continue to have your blood tested as directed by your health care provider. What should I know about cancer screening? There are several types of  cancer. Take the following steps to reduce your risk and to catch any cancer development as early as possible. Breast Cancer  Practice breast self-awareness.  This means understanding how your breasts normally appear and feel.  It also means doing regular breast self-exams. Let your health care provider know about any changes, no matter how small.  If you are 64 or older, have a clinician do a breast exam (clinical breast exam or CBE) every year. Depending on your age, family history, and medical history, it may be recommended that you also have a yearly breast X-ray (mammogram).  If you have a  family history of breast cancer, talk with your health care provider about genetic screening.  If you are at high risk for breast cancer, talk with your health care provider about having an MRI and a mammogram every year.  Breast cancer (BRCA) gene test is recommended for women who have family members with BRCA-related cancers. Results of the assessment will determine the need for genetic counseling and BRCA1 and for BRCA2 testing. BRCA-related cancers include these types:  Breast. This occurs in males or females.  Ovarian.  Tubal. This may also be called fallopian tube cancer.  Cancer of the abdominal or pelvic lining (peritoneal cancer).  Prostate.  Pancreatic. Cervical, Uterine, and Ovarian Cancer  Your health care provider may recommend that you be screened regularly for cancer of the pelvic organs. These include your ovaries, uterus, and vagina. This screening involves a pelvic exam, which includes checking for microscopic changes to the surface of your cervix (Pap test).  For women ages 21-65, health care providers may recommend a pelvic exam and a Pap test every three years. For women ages 40-65, they may recommend the Pap test and pelvic exam, combined with testing for human papilloma virus (HPV), every five years. Some types of HPV increase your risk of cervical cancer. Testing for HPV may also be done on women of any age who have unclear Pap test results.  Other health care providers may not recommend any screening for nonpregnant women who are considered low risk for pelvic cancer and have no symptoms. Ask your health care provider if a screening pelvic exam is right for you.  If you have had past treatment for cervical cancer or a condition that could lead to cancer, you need Pap tests and screening for cancer for at least 20 years after your treatment. If Pap tests have been discontinued for you, your risk factors (such as having a new sexual partner) need to be reassessed to  determine if you should start having screenings again. Some women have medical problems that increase the chance of getting cervical cancer. In these cases, your health care provider may recommend that you have screening and Pap tests more often.  If you have a family history of uterine cancer or ovarian cancer, talk with your health care provider about genetic screening.  If you have vaginal bleeding after reaching menopause, tell your health care provider.  There are currently no reliable tests available to screen for ovarian cancer. Lung Cancer  Lung cancer screening is recommended for adults 86-37 years old who are at high risk for lung cancer because of a history of smoking. A yearly low-dose CT scan of the lungs is recommended if you:  Currently smoke.  Have a history of at least 30 pack-years of smoking and you currently smoke or have quit within the past 15 years. A pack-year is smoking an average of one pack of  cigarettes per day for one year. Yearly screening should:  Continue until it has been 15 years since you quit.  Stop if you develop a health problem that would prevent you from having lung cancer treatment. Colorectal Cancer  This type of cancer can be detected and can often be prevented.  Routine colorectal cancer screening usually begins at age 2 and continues through age 31.  If you have risk factors for colon cancer, your health care provider may recommend that you be screened at an earlier age.  If you have a family history of colorectal cancer, talk with your health care provider about genetic screening.  Your health care provider may also recommend using home test kits to check for hidden blood in your stool.  A small camera at the end of a tube can be used to examine your colon directly (sigmoidoscopy or colonoscopy). This is done to check for the earliest forms of colorectal cancer.  Direct examination of the colon should be repeated every 5-10 years until  age 25. However, if early forms of precancerous polyps or small growths are found or if you have a family history or genetic risk for colorectal cancer, you may need to be screened more often. Skin Cancer  Check your skin from head to toe regularly.  Monitor any moles. Be sure to tell your health care provider:  About any new moles or changes in moles, especially if there is a change in a mole's shape or color.  If you have a mole that is larger than the size of a pencil eraser.  If any of your family members has a history of skin cancer, especially at a young age, talk with your health care provider about genetic screening.  Always use sunscreen. Apply sunscreen liberally and repeatedly throughout the day.  Whenever you are outside, protect yourself by wearing long sleeves, pants, a wide-brimmed hat, and sunglasses. What should I know about osteoporosis? Osteoporosis is a condition in which bone destruction happens more quickly than new bone creation. After menopause, you may be at an increased risk for osteoporosis. To help prevent osteoporosis or the bone fractures that can happen because of osteoporosis, the following is recommended:  If you are 6-32 years old, get at least 1,000 mg of calcium and at least 600 mg of vitamin D per day.  If you are older than age 39 but younger than age 34, get at least 1,200 mg of calcium and at least 600 mg of vitamin D per day.  If you are older than age 31, get at least 1,200 mg of calcium and at least 800 mg of vitamin D per day. Smoking and excessive alcohol intake increase the risk of osteoporosis. Eat foods that are rich in calcium and vitamin D, and do weight-bearing exercises several times each week as directed by your health care provider. What should I know about how menopause affects my mental health? Depression may occur at any age, but it is more common as you become older. Common symptoms of depression include:  Low or sad  mood.  Changes in sleep patterns.  Changes in appetite or eating patterns.  Feeling an overall lack of motivation or enjoyment of activities that you previously enjoyed.  Frequent crying spells. Talk with your health care provider if you think that you are experiencing depression. What should I know about immunizations? It is important that you get and maintain your immunizations. These include:  Tetanus, diphtheria, and pertussis (Tdap) booster vaccine.  Influenza every year before the flu season begins.  Pneumonia vaccine.  Shingles vaccine. Your health care provider may also recommend other immunizations. This information is not intended to replace advice given to you by your health care provider. Make sure you discuss any questions you have with your health care provider. Document Released: 08/29/2005 Document Revised: 01/25/2016 Document Reviewed: 04/10/2015 Elsevier Interactive Patient Education  2017 [employer] Prevention in the Home Falls can cause injuries and can affect people from all age groups. There are many simple things that you can do to make your home safe and to help prevent falls. What can I do on the outside of my home?  Regularly repair the edges of walkways and driveways and fix any cracks.  Remove high doorway thresholds.  Trim any shrubbery on the main path into your home.  Use bright outdoor lighting.  Clear walkways of debris and clutter, including tools and rocks.  Regularly check that handrails are securely fastened and in good repair. Both sides of any steps should have handrails.  Install guardrails along the edges of any raised decks or porches.  Have leaves, snow, and ice cleared regularly.  Use sand or salt on walkways during winter months.  In the garage, clean up any spills right away, including grease or oil spills. What can I do in the bathroom?  Use night lights.  Install grab bars by the toilet and in the tub and  shower. Do not use towel bars as grab bars.  Use non-skid mats or decals on the floor of the tub or shower.  If you need to sit down while you are in the shower, use a plastic, non-slip stool.  Keep the floor dry. Immediately clean up any water that spills on the floor.  Remove soap buildup in the tub or shower on a regular basis.  Attach bath mats securely with double-sided non-slip rug tape.  Remove throw rugs and other tripping hazards from the floor. What can I do in the bedroom?  Use night lights.  Make sure that a bedside light is easy to reach.  Do not use oversized bedding that drapes onto the floor.  Have a firm chair that has side arms to use for getting dressed.  Remove throw rugs and other tripping hazards from the floor. What can I do in the kitchen?  Clean up any spills right away.  Avoid walking on wet floors.  Place frequently used items in easy-to-reach places.  If you need to reach for something above you, use a sturdy step stool that has a grab bar.  Keep electrical cables out of the way.  Do not use floor polish or wax that makes floors slippery. If you have to use wax, make sure that it is non-skid floor wax.  Remove throw rugs and other tripping hazards from the floor. What can I do in the stairways?  Do not leave any items on the stairs.  Make sure that there are handrails on both sides of the stairs. Fix handrails that are broken or loose. Make sure that handrails are as long as the stairways.  Check any carpeting to make sure that it is firmly attached to the stairs. Fix any carpet that is loose or worn.  Avoid having throw rugs at the top or bottom of stairways, or secure the rugs with carpet tape to prevent them from moving.  Make sure that you have a light switch at the top of the stairs  and the bottom of the stairs. If you do not have them, have them installed. What are some other fall prevention tips?  Wear closed-toe shoes that fit  well and support your feet. Wear shoes that have rubber soles or low heels.  When you use a stepladder, make sure that it is completely opened and that the sides are firmly locked. Have someone hold the ladder while you are using it. Do not climb a closed stepladder.  Add color or contrast paint or tape to grab bars and handrails in your home. Place contrasting color strips on the first and last steps.  Use mobility aids as needed, such as canes, walkers, scooters, and crutches.  Turn on lights if it is dark. Replace any light bulbs that burn out.  Set up furniture so that there are clear paths. Keep the furniture in the same spot.  Fix any uneven floor surfaces.  Choose a carpet design that does not hide the edge of steps of a stairway.  Be aware of any and all pets.  Review your medicines with your healthcare provider. Some medicines can cause dizziness or changes in blood pressure, which increase your risk of falling. Talk with your health care provider about other ways that you can decrease your risk of falls. This may include working with a physical therapist or trainer to improve your strength, balance, and endurance. This information is not intended to replace advice given to you by your health care provider. Make sure you discuss any questions you have with your health care provider. Document Released: 06/27/2002 Document Revised: 12/04/2015 Document Reviewed: 08/11/2014 Elsevier Interactive Patient Education  2017 [employer].

## 2016-10-17 DIAGNOSIS — N186 End stage renal disease: Secondary | ICD-10-CM | POA: Diagnosis not present

## 2016-10-17 DIAGNOSIS — D631 Anemia in chronic kidney disease: Secondary | ICD-10-CM | POA: Diagnosis not present

## 2016-10-17 DIAGNOSIS — N2581 Secondary hyperparathyroidism of renal origin: Secondary | ICD-10-CM | POA: Diagnosis not present

## 2016-10-18 DIAGNOSIS — Z992 Dependence on renal dialysis: Secondary | ICD-10-CM | POA: Diagnosis not present

## 2016-10-18 DIAGNOSIS — I158 Other secondary hypertension: Secondary | ICD-10-CM | POA: Diagnosis not present

## 2016-10-18 DIAGNOSIS — N186 End stage renal disease: Secondary | ICD-10-CM | POA: Diagnosis not present

## 2016-10-19 NOTE — Telephone Encounter (Signed)
I think this is fine for now. She is on Fosamax,so I would recommend having DEXA in 5 years.  Thanks, BJ

## 2016-10-20 DIAGNOSIS — N186 End stage renal disease: Secondary | ICD-10-CM | POA: Diagnosis not present

## 2016-10-20 DIAGNOSIS — D631 Anemia in chronic kidney disease: Secondary | ICD-10-CM | POA: Diagnosis not present

## 2016-10-20 DIAGNOSIS — N2581 Secondary hyperparathyroidism of renal origin: Secondary | ICD-10-CM | POA: Diagnosis not present

## 2016-10-22 DIAGNOSIS — N2581 Secondary hyperparathyroidism of renal origin: Secondary | ICD-10-CM | POA: Diagnosis not present

## 2016-10-22 DIAGNOSIS — N186 End stage renal disease: Secondary | ICD-10-CM | POA: Diagnosis not present

## 2016-10-22 DIAGNOSIS — I4891 Unspecified atrial fibrillation: Secondary | ICD-10-CM | POA: Diagnosis not present

## 2016-10-22 DIAGNOSIS — D631 Anemia in chronic kidney disease: Secondary | ICD-10-CM | POA: Diagnosis not present

## 2016-10-23 NOTE — Telephone Encounter (Signed)
fup review of the record Noted Dr. Shelton Silvas / followed by Dr. Venora Maples did order Tdap on 06/20/15 This was added to the HM record  Mammogram was ordered at that time Mammogram has been reordered  Call to the patient and LVM that I cannot find evidence of a dexa or a order for one, only that It was due since 2010 when she turned 63. Do not see fosamax hx but will confirm with the patient

## 2016-10-24 DIAGNOSIS — D631 Anemia in chronic kidney disease: Secondary | ICD-10-CM | POA: Diagnosis not present

## 2016-10-24 DIAGNOSIS — N186 End stage renal disease: Secondary | ICD-10-CM | POA: Diagnosis not present

## 2016-10-24 DIAGNOSIS — N2581 Secondary hyperparathyroidism of renal origin: Secondary | ICD-10-CM | POA: Diagnosis not present

## 2016-10-27 DIAGNOSIS — N186 End stage renal disease: Secondary | ICD-10-CM | POA: Diagnosis not present

## 2016-10-27 DIAGNOSIS — D631 Anemia in chronic kidney disease: Secondary | ICD-10-CM | POA: Diagnosis not present

## 2016-10-27 DIAGNOSIS — N2581 Secondary hyperparathyroidism of renal origin: Secondary | ICD-10-CM | POA: Diagnosis not present

## 2016-10-29 DIAGNOSIS — I4891 Unspecified atrial fibrillation: Secondary | ICD-10-CM | POA: Diagnosis not present

## 2016-10-29 DIAGNOSIS — N186 End stage renal disease: Secondary | ICD-10-CM | POA: Diagnosis not present

## 2016-10-29 DIAGNOSIS — D631 Anemia in chronic kidney disease: Secondary | ICD-10-CM | POA: Diagnosis not present

## 2016-10-29 DIAGNOSIS — N2581 Secondary hyperparathyroidism of renal origin: Secondary | ICD-10-CM | POA: Diagnosis not present

## 2016-10-30 DIAGNOSIS — Z880 Allergy status to penicillin: Secondary | ICD-10-CM | POA: Diagnosis not present

## 2016-10-30 DIAGNOSIS — I4891 Unspecified atrial fibrillation: Secondary | ICD-10-CM | POA: Diagnosis not present

## 2016-10-30 DIAGNOSIS — N189 Chronic kidney disease, unspecified: Secondary | ICD-10-CM | POA: Diagnosis not present

## 2016-10-30 DIAGNOSIS — J45909 Unspecified asthma, uncomplicated: Secondary | ICD-10-CM | POA: Diagnosis not present

## 2016-10-30 DIAGNOSIS — M533 Sacrococcygeal disorders, not elsewhere classified: Secondary | ICD-10-CM | POA: Diagnosis not present

## 2016-10-30 DIAGNOSIS — Z7901 Long term (current) use of anticoagulants: Secondary | ICD-10-CM | POA: Diagnosis not present

## 2016-10-30 DIAGNOSIS — I129 Hypertensive chronic kidney disease with stage 1 through stage 4 chronic kidney disease, or unspecified chronic kidney disease: Secondary | ICD-10-CM | POA: Diagnosis not present

## 2016-10-30 DIAGNOSIS — Z91048 Other nonmedicinal substance allergy status: Secondary | ICD-10-CM | POA: Diagnosis not present

## 2016-10-30 DIAGNOSIS — K219 Gastro-esophageal reflux disease without esophagitis: Secondary | ICD-10-CM | POA: Diagnosis not present

## 2016-10-30 DIAGNOSIS — M47817 Spondylosis without myelopathy or radiculopathy, lumbosacral region: Secondary | ICD-10-CM | POA: Diagnosis not present

## 2016-10-30 DIAGNOSIS — Z91040 Latex allergy status: Secondary | ICD-10-CM | POA: Diagnosis not present

## 2016-10-30 DIAGNOSIS — G4733 Obstructive sleep apnea (adult) (pediatric): Secondary | ICD-10-CM | POA: Diagnosis not present

## 2016-10-30 DIAGNOSIS — Z79899 Other long term (current) drug therapy: Secondary | ICD-10-CM | POA: Diagnosis not present

## 2016-10-30 DIAGNOSIS — Z882 Allergy status to sulfonamides status: Secondary | ICD-10-CM | POA: Diagnosis not present

## 2016-10-31 DIAGNOSIS — N2581 Secondary hyperparathyroidism of renal origin: Secondary | ICD-10-CM | POA: Diagnosis not present

## 2016-10-31 DIAGNOSIS — N186 End stage renal disease: Secondary | ICD-10-CM | POA: Diagnosis not present

## 2016-10-31 DIAGNOSIS — D631 Anemia in chronic kidney disease: Secondary | ICD-10-CM | POA: Diagnosis not present

## 2016-10-31 NOTE — Telephone Encounter (Signed)
Per Constance Holster, call rec'd from [facility] regarding outreach fup call placed [phone] and left VM for call Back  Can find dexa done. Asked if she wanted it ordered? Or does she prefer to wait until she see Dr. [name]. She can schedule with her mammogram at the breast center  Noted that Dr. [name] thought she is on fosamax. Please confirm as if she is, then dexa will be postponed.

## 2016-11-03 DIAGNOSIS — D631 Anemia in chronic kidney disease: Secondary | ICD-10-CM | POA: Diagnosis not present

## 2016-11-03 DIAGNOSIS — N186 End stage renal disease: Secondary | ICD-10-CM | POA: Diagnosis not present

## 2016-11-03 DIAGNOSIS — N2581 Secondary hyperparathyroidism of renal origin: Secondary | ICD-10-CM | POA: Diagnosis not present

## 2016-11-04 ENCOUNTER — Other Ambulatory Visit (HOSPITAL_COMMUNITY): Payer: Self-pay | Admitting: Anesthesiology

## 2016-11-04 DIAGNOSIS — M961 Postlaminectomy syndrome, not elsewhere classified: Secondary | ICD-10-CM

## 2016-11-05 ENCOUNTER — Other Ambulatory Visit: Payer: Self-pay

## 2016-11-05 DIAGNOSIS — I4891 Unspecified atrial fibrillation: Secondary | ICD-10-CM | POA: Diagnosis not present

## 2016-11-05 DIAGNOSIS — D631 Anemia in chronic kidney disease: Secondary | ICD-10-CM | POA: Diagnosis not present

## 2016-11-05 DIAGNOSIS — N2581 Secondary hyperparathyroidism of renal origin: Secondary | ICD-10-CM | POA: Diagnosis not present

## 2016-11-05 DIAGNOSIS — N186 End stage renal disease: Secondary | ICD-10-CM | POA: Diagnosis not present

## 2016-11-07 DIAGNOSIS — N2581 Secondary hyperparathyroidism of renal origin: Secondary | ICD-10-CM | POA: Diagnosis not present

## 2016-11-07 DIAGNOSIS — N186 End stage renal disease: Secondary | ICD-10-CM | POA: Diagnosis not present

## 2016-11-07 DIAGNOSIS — D631 Anemia in chronic kidney disease: Secondary | ICD-10-CM | POA: Diagnosis not present

## 2016-11-07 NOTE — Telephone Encounter (Signed)
Call back Lambert  No hx of fx  Never smoked No hx of fosamax  Will postpone until you see Dr. [name] and sees her Would have been done in [place] at 71yo Planning on mammogram and will talk to dr [name] regarding waiving or completing bone density  Just FYI Dr. [name]

## 2016-11-07 NOTE — Telephone Encounter (Signed)
Left 2nd message with [name] regarding dexa. Can wait until the next office visit to fup

## 2016-11-10 DIAGNOSIS — Z992 Dependence on renal dialysis: Secondary | ICD-10-CM | POA: Diagnosis not present

## 2016-11-10 DIAGNOSIS — I480 Paroxysmal atrial fibrillation: Secondary | ICD-10-CM | POA: Diagnosis not present

## 2016-11-10 DIAGNOSIS — N2581 Secondary hyperparathyroidism of renal origin: Secondary | ICD-10-CM | POA: Diagnosis not present

## 2016-11-10 DIAGNOSIS — N186 End stage renal disease: Secondary | ICD-10-CM | POA: Diagnosis not present

## 2016-11-10 DIAGNOSIS — D631 Anemia in chronic kidney disease: Secondary | ICD-10-CM | POA: Diagnosis not present

## 2016-11-10 DIAGNOSIS — R9431 Abnormal electrocardiogram [ECG] [EKG]: Secondary | ICD-10-CM | POA: Diagnosis not present

## 2016-11-12 DIAGNOSIS — N186 End stage renal disease: Secondary | ICD-10-CM | POA: Diagnosis not present

## 2016-11-12 DIAGNOSIS — I4891 Unspecified atrial fibrillation: Secondary | ICD-10-CM | POA: Diagnosis not present

## 2016-11-12 DIAGNOSIS — N2581 Secondary hyperparathyroidism of renal origin: Secondary | ICD-10-CM | POA: Diagnosis not present

## 2016-11-12 DIAGNOSIS — D631 Anemia in chronic kidney disease: Secondary | ICD-10-CM | POA: Diagnosis not present

## 2016-11-12 DIAGNOSIS — E1129 Type 2 diabetes mellitus with other diabetic kidney complication: Secondary | ICD-10-CM | POA: Diagnosis not present

## 2016-11-14 DIAGNOSIS — N2581 Secondary hyperparathyroidism of renal origin: Secondary | ICD-10-CM | POA: Diagnosis not present

## 2016-11-14 DIAGNOSIS — N186 End stage renal disease: Secondary | ICD-10-CM | POA: Diagnosis not present

## 2016-11-14 DIAGNOSIS — D631 Anemia in chronic kidney disease: Secondary | ICD-10-CM | POA: Diagnosis not present

## 2016-11-17 DIAGNOSIS — N2581 Secondary hyperparathyroidism of renal origin: Secondary | ICD-10-CM | POA: Diagnosis not present

## 2016-11-17 DIAGNOSIS — D631 Anemia in chronic kidney disease: Secondary | ICD-10-CM | POA: Diagnosis not present

## 2016-11-17 DIAGNOSIS — Z992 Dependence on renal dialysis: Secondary | ICD-10-CM | POA: Diagnosis not present

## 2016-11-17 DIAGNOSIS — I158 Other secondary hypertension: Secondary | ICD-10-CM | POA: Diagnosis not present

## 2016-11-17 DIAGNOSIS — N186 End stage renal disease: Secondary | ICD-10-CM | POA: Diagnosis not present

## 2016-11-19 DIAGNOSIS — N2581 Secondary hyperparathyroidism of renal origin: Secondary | ICD-10-CM | POA: Diagnosis not present

## 2016-11-19 DIAGNOSIS — I4891 Unspecified atrial fibrillation: Secondary | ICD-10-CM | POA: Diagnosis not present

## 2016-11-19 DIAGNOSIS — D631 Anemia in chronic kidney disease: Secondary | ICD-10-CM | POA: Diagnosis not present

## 2016-11-19 DIAGNOSIS — N186 End stage renal disease: Secondary | ICD-10-CM | POA: Diagnosis not present

## 2016-11-21 DIAGNOSIS — N2581 Secondary hyperparathyroidism of renal origin: Secondary | ICD-10-CM | POA: Diagnosis not present

## 2016-11-21 DIAGNOSIS — D631 Anemia in chronic kidney disease: Secondary | ICD-10-CM | POA: Diagnosis not present

## 2016-11-21 DIAGNOSIS — N186 End stage renal disease: Secondary | ICD-10-CM | POA: Diagnosis not present

## 2016-11-22 ENCOUNTER — Ambulatory Visit (HOSPITAL_COMMUNITY)
Admission: RE | Admit: 2016-11-22 | Discharge: 2016-11-22 | Disposition: A | Discharge status: Home / Self Care | Payer: Medicare Other | Source: Ambulatory Visit | Attending: Anesthesiology | Admitting: Anesthesiology

## 2016-11-22 ENCOUNTER — Ambulatory Visit (HOSPITAL_COMMUNITY): Payer: Medicare Other

## 2016-11-22 ENCOUNTER — Encounter (HOSPITAL_COMMUNITY): Payer: Self-pay

## 2016-11-22 DIAGNOSIS — M961 Postlaminectomy syndrome, not elsewhere classified: Secondary | ICD-10-CM

## 2016-11-24 DIAGNOSIS — N2581 Secondary hyperparathyroidism of renal origin: Secondary | ICD-10-CM | POA: Diagnosis not present

## 2016-11-24 DIAGNOSIS — D631 Anemia in chronic kidney disease: Secondary | ICD-10-CM | POA: Diagnosis not present

## 2016-11-24 DIAGNOSIS — N186 End stage renal disease: Secondary | ICD-10-CM | POA: Diagnosis not present

## 2016-11-26 DIAGNOSIS — N2581 Secondary hyperparathyroidism of renal origin: Secondary | ICD-10-CM | POA: Diagnosis not present

## 2016-11-26 DIAGNOSIS — I4891 Unspecified atrial fibrillation: Secondary | ICD-10-CM | POA: Diagnosis not present

## 2016-11-26 DIAGNOSIS — D631 Anemia in chronic kidney disease: Secondary | ICD-10-CM | POA: Diagnosis not present

## 2016-11-26 DIAGNOSIS — N186 End stage renal disease: Secondary | ICD-10-CM | POA: Diagnosis not present

## 2016-11-28 DIAGNOSIS — D631 Anemia in chronic kidney disease: Secondary | ICD-10-CM | POA: Diagnosis not present

## 2016-11-28 DIAGNOSIS — N2581 Secondary hyperparathyroidism of renal origin: Secondary | ICD-10-CM | POA: Diagnosis not present

## 2016-11-28 DIAGNOSIS — N186 End stage renal disease: Secondary | ICD-10-CM | POA: Diagnosis not present

## 2016-12-01 DIAGNOSIS — N2581 Secondary hyperparathyroidism of renal origin: Secondary | ICD-10-CM | POA: Diagnosis not present

## 2016-12-01 DIAGNOSIS — N186 End stage renal disease: Secondary | ICD-10-CM | POA: Diagnosis not present

## 2016-12-01 DIAGNOSIS — D631 Anemia in chronic kidney disease: Secondary | ICD-10-CM | POA: Diagnosis not present

## 2016-12-02 DIAGNOSIS — R0989 Other specified symptoms and signs involving the circulatory and respiratory systems: Secondary | ICD-10-CM | POA: Diagnosis not present

## 2016-12-03 DIAGNOSIS — N2581 Secondary hyperparathyroidism of renal origin: Secondary | ICD-10-CM | POA: Diagnosis not present

## 2016-12-03 DIAGNOSIS — D631 Anemia in chronic kidney disease: Secondary | ICD-10-CM | POA: Diagnosis not present

## 2016-12-03 DIAGNOSIS — I4891 Unspecified atrial fibrillation: Secondary | ICD-10-CM | POA: Diagnosis not present

## 2016-12-03 DIAGNOSIS — N186 End stage renal disease: Secondary | ICD-10-CM | POA: Diagnosis not present

## 2016-12-05 DIAGNOSIS — N186 End stage renal disease: Secondary | ICD-10-CM | POA: Diagnosis not present

## 2016-12-05 DIAGNOSIS — D631 Anemia in chronic kidney disease: Secondary | ICD-10-CM | POA: Diagnosis not present

## 2016-12-05 DIAGNOSIS — N2581 Secondary hyperparathyroidism of renal origin: Secondary | ICD-10-CM | POA: Diagnosis not present

## 2016-12-08 DIAGNOSIS — D631 Anemia in chronic kidney disease: Secondary | ICD-10-CM | POA: Diagnosis not present

## 2016-12-08 DIAGNOSIS — N186 End stage renal disease: Secondary | ICD-10-CM | POA: Diagnosis not present

## 2016-12-08 DIAGNOSIS — N2581 Secondary hyperparathyroidism of renal origin: Secondary | ICD-10-CM | POA: Diagnosis not present

## 2016-12-10 ENCOUNTER — Other Ambulatory Visit: Payer: Self-pay | Admitting: Anesthesiology

## 2016-12-10 DIAGNOSIS — N2581 Secondary hyperparathyroidism of renal origin: Secondary | ICD-10-CM | POA: Diagnosis not present

## 2016-12-10 DIAGNOSIS — N186 End stage renal disease: Secondary | ICD-10-CM | POA: Diagnosis not present

## 2016-12-10 DIAGNOSIS — D631 Anemia in chronic kidney disease: Secondary | ICD-10-CM | POA: Diagnosis not present

## 2016-12-10 DIAGNOSIS — I4891 Unspecified atrial fibrillation: Secondary | ICD-10-CM | POA: Diagnosis not present

## 2016-12-10 DIAGNOSIS — M961 Postlaminectomy syndrome, not elsewhere classified: Secondary | ICD-10-CM

## 2016-12-12 DIAGNOSIS — N2581 Secondary hyperparathyroidism of renal origin: Secondary | ICD-10-CM | POA: Diagnosis not present

## 2016-12-12 DIAGNOSIS — N186 End stage renal disease: Secondary | ICD-10-CM | POA: Diagnosis not present

## 2016-12-12 DIAGNOSIS — D631 Anemia in chronic kidney disease: Secondary | ICD-10-CM | POA: Diagnosis not present

## 2016-12-15 DIAGNOSIS — D631 Anemia in chronic kidney disease: Secondary | ICD-10-CM | POA: Diagnosis not present

## 2016-12-15 DIAGNOSIS — N186 End stage renal disease: Secondary | ICD-10-CM | POA: Diagnosis not present

## 2016-12-15 DIAGNOSIS — N2581 Secondary hyperparathyroidism of renal origin: Secondary | ICD-10-CM | POA: Diagnosis not present

## 2016-12-16 ENCOUNTER — Ambulatory Visit: Payer: Medicare Other | Admitting: Family Medicine

## 2016-12-17 DIAGNOSIS — N186 End stage renal disease: Secondary | ICD-10-CM | POA: Diagnosis not present

## 2016-12-17 DIAGNOSIS — I4891 Unspecified atrial fibrillation: Secondary | ICD-10-CM | POA: Diagnosis not present

## 2016-12-17 DIAGNOSIS — N2581 Secondary hyperparathyroidism of renal origin: Secondary | ICD-10-CM | POA: Diagnosis not present

## 2016-12-17 DIAGNOSIS — D631 Anemia in chronic kidney disease: Secondary | ICD-10-CM | POA: Diagnosis not present

## 2016-12-18 DIAGNOSIS — N186 End stage renal disease: Secondary | ICD-10-CM | POA: Diagnosis not present

## 2016-12-18 DIAGNOSIS — Z992 Dependence on renal dialysis: Secondary | ICD-10-CM | POA: Diagnosis not present

## 2016-12-18 DIAGNOSIS — I158 Other secondary hypertension: Secondary | ICD-10-CM | POA: Diagnosis not present

## 2016-12-19 ENCOUNTER — Ambulatory Visit (INDEPENDENT_AMBULATORY_CARE_PROVIDER_SITE_OTHER): Payer: Medicare Other | Admitting: Family Medicine

## 2016-12-19 ENCOUNTER — Ambulatory Visit: Payer: Medicare Other | Admitting: Family Medicine

## 2016-12-19 ENCOUNTER — Encounter: Payer: Self-pay | Admitting: Family Medicine

## 2016-12-19 VITALS — BP 120/70 | HR 68 | Resp 12 | Ht 65.0 in | Wt 239.2 lb

## 2016-12-19 DIAGNOSIS — I129 Hypertensive chronic kidney disease with stage 1 through stage 4 chronic kidney disease, or unspecified chronic kidney disease: Secondary | ICD-10-CM | POA: Diagnosis not present

## 2016-12-19 DIAGNOSIS — I480 Paroxysmal atrial fibrillation: Secondary | ICD-10-CM

## 2016-12-19 DIAGNOSIS — N2581 Secondary hyperparathyroidism of renal origin: Secondary | ICD-10-CM | POA: Diagnosis not present

## 2016-12-19 DIAGNOSIS — Z6839 Body mass index (BMI) 39.0-39.9, adult: Secondary | ICD-10-CM

## 2016-12-19 DIAGNOSIS — R9431 Abnormal electrocardiogram [ECG] [EKG]: Secondary | ICD-10-CM | POA: Diagnosis not present

## 2016-12-19 DIAGNOSIS — R0789 Other chest pain: Secondary | ICD-10-CM | POA: Diagnosis not present

## 2016-12-19 DIAGNOSIS — D631 Anemia in chronic kidney disease: Secondary | ICD-10-CM | POA: Diagnosis not present

## 2016-12-19 DIAGNOSIS — K5900 Constipation, unspecified: Secondary | ICD-10-CM

## 2016-12-19 DIAGNOSIS — N186 End stage renal disease: Secondary | ICD-10-CM | POA: Diagnosis not present

## 2016-12-19 MED ORDER — CARVEDILOL 25 MG PO TABS: 50.0000 mg | ORAL_TABLET | Freq: Two times a day (BID) | ORAL | 2 refills | Status: DC

## 2016-12-19 MED ORDER — WARFARIN SODIUM 5 MG PO TABS: ORAL_TABLET | ORAL | 1 refills | Status: DC

## 2016-12-19 MED ORDER — LINACLOTIDE 145 MCG PO CAPS: 145.0000 ug | ORAL_CAPSULE | Freq: Every day | ORAL | 1 refills | Status: DC

## 2016-12-19 MED ORDER — LOSARTAN POTASSIUM 100 MG PO TABS: 100.0000 mg | ORAL_TABLET | Freq: Every day | ORAL | 2 refills | Status: DC

## 2016-12-19 NOTE — Patient Instructions (Addendum)
A few things to remember from today's visit:   Paroxysmal atrial fibrillation ([place]) - Plan: warfarin (COUMADIN) 5 MG tablet  Hypertension with renal disease - Plan: carvedilol (COREG) 25 MG tablet, losartan (COZAAR) 100 MG tablet  Constipation, unspecified constipation type - Plan: linaclotide (LINZESS) 145 MCG CAPS capsule  Linzess added today. Rest of medications unchanged.  In about 2-3 weeks please let me know through My Chart or by calling the office about tolerance of new medication.   Please be sure medication list is accurate. If a new problem present, please set up appointment sooner than planned today.

## 2016-12-19 NOTE — Progress Notes (Signed)
HPI:   Jane [name] is a 71 y.o. female, who is here today with her daughter to follow on some chronic medical problems.  Constipation: It is not better with OTC Bisacodyl and Miralax. Last bowel movement yesterday after taking 4 different laxatives and using a suppository. She has small and hard bowel movements q 2-3 days.   Denies abdominal pain, nausea, vomiting, blood in stool or melena. She is on chronic opioid for chronic  Back pain.  HTN:  She is on Coreg 50 mg bid and Cozaar 100 mg daily.  Atrial fib: Amiodarone decreased today from 200 mg to 100 mg daily, when she followed with her cardiologists. She needs refills for Coumadin, she is taking 5 mg daily x 5 days and 7.5 mg x 1 day. Last INR was 1.9, Coumadin was adjusted. Because transportation issues, INR is usually sent to lab from hemodialysis center and pharmacists from [facility] usually adjusts dose accordingly.  She also needs refills on Coreg and Cozaar. Denies severe/frequent headache, visual changes, chest pain, dyspnea, palpitation, claudication, focal weakness, or edema.  ESRD, having hemodialysis 3 times per week. She has not been compliant with dietary recommendations, she is frustrated because she has several dietary restrictions due to ESRD and coumadin mainly. She doe snot tolerate big portions of protein, causes nausea. She can tolerate potatoes well and eats a lot of these (per daughter report), also eats vegetables sometimes, not consistently.  She does not exercise regularly because chronic pain.    Review of Systems  Constitutional: Positive for fatigue. Negative for activity change, appetite change and fever.  HENT: Negative for mouth sores, nosebleeds and trouble swallowing.   Eyes: Negative for redness and visual disturbance.  Respiratory: Negative for cough, shortness of breath and wheezing.   Cardiovascular: Negative for chest pain, palpitations and leg swelling.  Gastrointestinal:  Positive for constipation. Negative for abdominal pain, blood in stool, nausea and vomiting.       Negative for changes in bowel habits.  Endocrine: Negative for cold intolerance and heat intolerance.  Genitourinary: Negative for dysuria and hematuria.  Musculoskeletal: Positive for back pain. Negative for gait problem.  Skin: Negative for pallor and rash.  Neurological: Negative for syncope, weakness and headaches.  Psychiatric/Behavioral: Negative for confusion. The patient is nervous/anxious.      Current Outpatient Prescriptions on File Prior to Visit  Medication Sig Dispense Refill  . albuterol (PROVENTIL HFA;VENTOLIN HFA) 108 (90 Base) MCG/ACT inhaler Inhale 1 puff into the lungs every 6 (six) hours as needed for wheezing or shortness of breath.    . Amino Acids-Protein Hydrolys (FEEDING SUPPLEMENT, PRO-STAT SUGAR FREE 64,) LIQD Take 30 mLs by mouth 2 (two) times daily. 900 mL 0  . amiodarone (PACERONE) 200 MG tablet Take 1 tablet (200 mg total) by mouth daily. 30 tablet 1  . Biotin 10000 MCG TABS Take 1 tablet by mouth daily.    . cinacalcet (SENSIPAR) 30 MG tablet Take 30 mg by mouth daily.    Marland Kitchen docusate sodium (PHILLIPS STOOL SOFTENER) 100 MG capsule Take 200 mg by mouth 2 (two) times daily.    . multivitamin (RENA-VIT) TABS tablet Take 1 tablet by mouth daily.    Marland Kitchen oxyCODONE-acetaminophen (PERCOCET/ROXICET) 5-325 MG tablet Take 1-2 tablets by mouth every 6 (six) hours as needed for severe pain. 15 tablet 0  . polyethylene glycol powder (GLYCOLAX/MIRALAX) powder Take 17 g by mouth every other day. [place]    . Probiotic Product (  PROBIOTIC PO) Take 1 tablet by mouth daily.    . sevelamer carbonate (RENVELA) 800 MG tablet Take 800-1,600 mg by mouth See admin instructions. 1,600 mg after meals TWO times a day and 800 mg after each snack     No current facility-administered medications on file prior to visit.      Past Medical History:  Diagnosis Date  . Acid reflux   .  Atrial fibrillation ([place])   . Hypertension   . Renal disorder   . Sleep apnea    Allergies  Allergen Reactions  . Tape Other (See Comments)    PLASTIC TAPE TEARS OFF THE SKIN AND BRUISES IT TERRIBLY!!  . Latex Rash    Social History   Social History  . Marital status: Single    Spouse name: N/A  . Number of children: N/A  . Years of education: N/A   Social History Main Topics  . Smoking status: Never Smoker  . Smokeless tobacco: Never Used  . Alcohol use No  . Drug use: No  . Sexual activity: No   Other Topics Concern  . None   Social History Narrative  . None    Vitals:   12/19/16 1500  BP: 120/70  Pulse: 68  Resp: 12  O2 sat at RA 95% Body mass index is 39.81 kg/m.   Wt Readings from Last 3 Encounters:  12/19/16 239 lb 4 oz (108.5 kg)  10/16/16 237 lb 2 oz (107.6 kg)  07/18/16 234 lb 2.1 oz (106.2 kg)    Physical Exam  Nursing note and vitals reviewed. Constitutional: She is oriented to person, place, and time. She appears well-developed. No distress.  HENT:  Head: Atraumatic.  Mouth/Throat: Oropharynx is clear and moist and mucous membranes are normal.  Eyes: Conjunctivae are normal. Pupils are equal, round, and reactive to light. Right eye exhibits nystagmus. Left eye exhibits nystagmus.  Horizontal nystagmus appreciated, otherwise normal EOM bilateral.  Cardiovascular: Normal rate and regular rhythm.   Murmur (SEM I/VI RUSB) heard. Pulses:      Dorsalis pedis pulses are 2+ on the right side, and 2+ on the left side.  Respiratory: Effort normal and breath sounds normal. No respiratory distress.  GI: Soft. She exhibits no mass. There is no hepatomegaly. There is no tenderness.  Musculoskeletal: She exhibits edema (Trace pitting LE edema,bilateral).  Lymphadenopathy:    She has no cervical adenopathy.  Neurological: She is alert and oriented to person, place, and time. She has normal strength.  Stable gait with no assistance.  Skin: Skin is warm.  No erythema.  Psychiatric: Her mood appears anxious.  Well groomed, good eye contact.    ASSESSMENT AND PLAN:   Moncerrat was seen today for follow-up.  Diagnoses and all orders for this visit:  Constipation, unspecified constipation type  We discussed some side effects of opioid medications. Fiber intake is important but needs to be consistent. Linzess side effects discussed, we can increase dose of needed. F/U in 3-4 months.  -     linaclotide (LINZESS) 145 MCG CAPS capsule; Take 1 capsule (145 mcg total) by mouth daily before breakfast.  Hypertension with renal disease  Adequately controlled. No changes in current management. Low salt diet to continue. Eye exam at least annually. F/U in 5 months, before if needed.  -     carvedilol (COREG) 25 MG tablet; Take 2 tablets (50 mg total) by mouth 2 (two) times daily with a meal. -     losartan (COZAAR) 100  MG tablet; Take 1 tablet (100 mg total) by mouth daily.  Paroxysmal atrial fibrillation (HCC)  Today rate and rhythm controlled. She is on a high dose BB and according to pt, she was taking higher dose before, well tolerated.  INR has been subtherapeutic , difficult for her to be consistent with diet, including vit K rich food. I am not sure if Eliquis can be tried, risk vs benefits and effectiveness given her Hx of ESRD. INR scheduled to be done with next hemodialysis.   -     warfarin (COUMADIN) 5 MG tablet; As directed.  Class 2 severe obesity due to excess calories with serious comorbidity and body mass index (BMI) of 39.0 to 39.9 in adult [facility])  We discussed benefits of wt loss as well as adverse effects of obesity. Consistency with healthy diet and physical activity recommended.     -Ms. Brittainy Eckles was advised to return sooner than planned today if new concerns arise.       Margan Elias G. [name], MD  [facility]. [place] office.

## 2016-12-20 ENCOUNTER — Ambulatory Visit
Admission: RE | Admit: 2016-12-20 | Discharge: 2016-12-20 | Disposition: A | Discharge status: Home / Self Care | Payer: Medicare Other | Source: Ambulatory Visit | Attending: Anesthesiology | Admitting: Anesthesiology

## 2016-12-20 DIAGNOSIS — M961 Postlaminectomy syndrome, not elsewhere classified: Secondary | ICD-10-CM

## 2016-12-20 DIAGNOSIS — M48061 Spinal stenosis, lumbar region without neurogenic claudication: Secondary | ICD-10-CM | POA: Diagnosis not present

## 2016-12-22 DIAGNOSIS — D631 Anemia in chronic kidney disease: Secondary | ICD-10-CM | POA: Diagnosis not present

## 2016-12-22 DIAGNOSIS — N186 End stage renal disease: Secondary | ICD-10-CM | POA: Diagnosis not present

## 2016-12-22 DIAGNOSIS — N2581 Secondary hyperparathyroidism of renal origin: Secondary | ICD-10-CM | POA: Diagnosis not present

## 2016-12-24 DIAGNOSIS — N2581 Secondary hyperparathyroidism of renal origin: Secondary | ICD-10-CM | POA: Diagnosis not present

## 2016-12-24 DIAGNOSIS — N186 End stage renal disease: Secondary | ICD-10-CM | POA: Diagnosis not present

## 2016-12-24 DIAGNOSIS — I4891 Unspecified atrial fibrillation: Secondary | ICD-10-CM | POA: Diagnosis not present

## 2016-12-24 DIAGNOSIS — D631 Anemia in chronic kidney disease: Secondary | ICD-10-CM | POA: Diagnosis not present

## 2016-12-26 DIAGNOSIS — N2581 Secondary hyperparathyroidism of renal origin: Secondary | ICD-10-CM | POA: Diagnosis not present

## 2016-12-26 DIAGNOSIS — D631 Anemia in chronic kidney disease: Secondary | ICD-10-CM | POA: Diagnosis not present

## 2016-12-26 DIAGNOSIS — N186 End stage renal disease: Secondary | ICD-10-CM | POA: Diagnosis not present

## 2016-12-29 ENCOUNTER — Other Ambulatory Visit: Payer: Self-pay | Admitting: Anesthesiology

## 2016-12-29 DIAGNOSIS — N186 End stage renal disease: Secondary | ICD-10-CM | POA: Diagnosis not present

## 2016-12-29 DIAGNOSIS — D631 Anemia in chronic kidney disease: Secondary | ICD-10-CM | POA: Diagnosis not present

## 2016-12-29 DIAGNOSIS — N2581 Secondary hyperparathyroidism of renal origin: Secondary | ICD-10-CM | POA: Diagnosis not present

## 2016-12-31 DIAGNOSIS — D631 Anemia in chronic kidney disease: Secondary | ICD-10-CM | POA: Diagnosis not present

## 2016-12-31 DIAGNOSIS — N186 End stage renal disease: Secondary | ICD-10-CM | POA: Diagnosis not present

## 2016-12-31 DIAGNOSIS — N2581 Secondary hyperparathyroidism of renal origin: Secondary | ICD-10-CM | POA: Diagnosis not present

## 2016-12-31 DIAGNOSIS — I4891 Unspecified atrial fibrillation: Secondary | ICD-10-CM | POA: Diagnosis not present

## 2017-01-02 DIAGNOSIS — M549 Dorsalgia, unspecified: Secondary | ICD-10-CM | POA: Diagnosis not present

## 2017-01-02 DIAGNOSIS — N2581 Secondary hyperparathyroidism of renal origin: Secondary | ICD-10-CM | POA: Diagnosis not present

## 2017-01-02 DIAGNOSIS — M961 Postlaminectomy syndrome, not elsewhere classified: Secondary | ICD-10-CM | POA: Diagnosis not present

## 2017-01-02 DIAGNOSIS — N186 End stage renal disease: Secondary | ICD-10-CM | POA: Diagnosis not present

## 2017-01-02 DIAGNOSIS — M47816 Spondylosis without myelopathy or radiculopathy, lumbar region: Secondary | ICD-10-CM | POA: Diagnosis not present

## 2017-01-02 DIAGNOSIS — M47817 Spondylosis without myelopathy or radiculopathy, lumbosacral region: Secondary | ICD-10-CM | POA: Diagnosis not present

## 2017-01-02 DIAGNOSIS — D631 Anemia in chronic kidney disease: Secondary | ICD-10-CM | POA: Diagnosis not present

## 2017-01-05 DIAGNOSIS — N2581 Secondary hyperparathyroidism of renal origin: Secondary | ICD-10-CM | POA: Diagnosis not present

## 2017-01-05 DIAGNOSIS — D631 Anemia in chronic kidney disease: Secondary | ICD-10-CM | POA: Diagnosis not present

## 2017-01-05 DIAGNOSIS — N186 End stage renal disease: Secondary | ICD-10-CM | POA: Diagnosis not present

## 2017-01-07 DIAGNOSIS — D631 Anemia in chronic kidney disease: Secondary | ICD-10-CM | POA: Diagnosis not present

## 2017-01-07 DIAGNOSIS — I4891 Unspecified atrial fibrillation: Secondary | ICD-10-CM | POA: Diagnosis not present

## 2017-01-07 DIAGNOSIS — N186 End stage renal disease: Secondary | ICD-10-CM | POA: Diagnosis not present

## 2017-01-07 DIAGNOSIS — N2581 Secondary hyperparathyroidism of renal origin: Secondary | ICD-10-CM | POA: Diagnosis not present

## 2017-01-09 DIAGNOSIS — N2581 Secondary hyperparathyroidism of renal origin: Secondary | ICD-10-CM | POA: Diagnosis not present

## 2017-01-09 DIAGNOSIS — D631 Anemia in chronic kidney disease: Secondary | ICD-10-CM | POA: Diagnosis not present

## 2017-01-09 DIAGNOSIS — N186 End stage renal disease: Secondary | ICD-10-CM | POA: Diagnosis not present

## 2017-01-12 DIAGNOSIS — N2581 Secondary hyperparathyroidism of renal origin: Secondary | ICD-10-CM | POA: Diagnosis not present

## 2017-01-12 DIAGNOSIS — N186 End stage renal disease: Secondary | ICD-10-CM | POA: Diagnosis not present

## 2017-01-12 DIAGNOSIS — D631 Anemia in chronic kidney disease: Secondary | ICD-10-CM | POA: Diagnosis not present

## 2017-01-14 DIAGNOSIS — D631 Anemia in chronic kidney disease: Secondary | ICD-10-CM | POA: Diagnosis not present

## 2017-01-14 DIAGNOSIS — N186 End stage renal disease: Secondary | ICD-10-CM | POA: Diagnosis not present

## 2017-01-14 DIAGNOSIS — N2581 Secondary hyperparathyroidism of renal origin: Secondary | ICD-10-CM | POA: Diagnosis not present

## 2017-01-14 DIAGNOSIS — I4891 Unspecified atrial fibrillation: Secondary | ICD-10-CM | POA: Diagnosis not present

## 2017-01-16 DIAGNOSIS — N186 End stage renal disease: Secondary | ICD-10-CM | POA: Diagnosis not present

## 2017-01-16 DIAGNOSIS — D631 Anemia in chronic kidney disease: Secondary | ICD-10-CM | POA: Diagnosis not present

## 2017-01-16 DIAGNOSIS — N2581 Secondary hyperparathyroidism of renal origin: Secondary | ICD-10-CM | POA: Diagnosis not present

## 2017-01-17 DIAGNOSIS — I158 Other secondary hypertension: Secondary | ICD-10-CM | POA: Diagnosis not present

## 2017-01-17 DIAGNOSIS — N186 End stage renal disease: Secondary | ICD-10-CM | POA: Diagnosis not present

## 2017-01-17 DIAGNOSIS — Z992 Dependence on renal dialysis: Secondary | ICD-10-CM | POA: Diagnosis not present

## 2017-01-19 DIAGNOSIS — N2581 Secondary hyperparathyroidism of renal origin: Secondary | ICD-10-CM | POA: Diagnosis not present

## 2017-01-19 DIAGNOSIS — N186 End stage renal disease: Secondary | ICD-10-CM | POA: Diagnosis not present

## 2017-01-19 DIAGNOSIS — D631 Anemia in chronic kidney disease: Secondary | ICD-10-CM | POA: Diagnosis not present

## 2017-01-21 DIAGNOSIS — I4891 Unspecified atrial fibrillation: Secondary | ICD-10-CM | POA: Diagnosis not present

## 2017-01-21 DIAGNOSIS — D631 Anemia in chronic kidney disease: Secondary | ICD-10-CM | POA: Diagnosis not present

## 2017-01-21 DIAGNOSIS — N186 End stage renal disease: Secondary | ICD-10-CM | POA: Diagnosis not present

## 2017-01-21 DIAGNOSIS — N2581 Secondary hyperparathyroidism of renal origin: Secondary | ICD-10-CM | POA: Diagnosis not present

## 2017-01-23 DIAGNOSIS — N2581 Secondary hyperparathyroidism of renal origin: Secondary | ICD-10-CM | POA: Diagnosis not present

## 2017-01-23 DIAGNOSIS — D631 Anemia in chronic kidney disease: Secondary | ICD-10-CM | POA: Diagnosis not present

## 2017-01-23 DIAGNOSIS — N186 End stage renal disease: Secondary | ICD-10-CM | POA: Diagnosis not present

## 2017-01-26 DIAGNOSIS — N2581 Secondary hyperparathyroidism of renal origin: Secondary | ICD-10-CM | POA: Diagnosis not present

## 2017-01-26 DIAGNOSIS — D631 Anemia in chronic kidney disease: Secondary | ICD-10-CM | POA: Diagnosis not present

## 2017-01-26 DIAGNOSIS — N186 End stage renal disease: Secondary | ICD-10-CM | POA: Diagnosis not present

## 2017-01-28 DIAGNOSIS — N186 End stage renal disease: Secondary | ICD-10-CM | POA: Diagnosis not present

## 2017-01-28 DIAGNOSIS — N2581 Secondary hyperparathyroidism of renal origin: Secondary | ICD-10-CM | POA: Diagnosis not present

## 2017-01-28 DIAGNOSIS — D631 Anemia in chronic kidney disease: Secondary | ICD-10-CM | POA: Diagnosis not present

## 2017-01-28 DIAGNOSIS — I4891 Unspecified atrial fibrillation: Secondary | ICD-10-CM | POA: Diagnosis not present

## 2017-01-30 DIAGNOSIS — D631 Anemia in chronic kidney disease: Secondary | ICD-10-CM | POA: Diagnosis not present

## 2017-01-30 DIAGNOSIS — N2581 Secondary hyperparathyroidism of renal origin: Secondary | ICD-10-CM | POA: Diagnosis not present

## 2017-01-30 DIAGNOSIS — N186 End stage renal disease: Secondary | ICD-10-CM | POA: Diagnosis not present

## 2017-02-02 DIAGNOSIS — N186 End stage renal disease: Secondary | ICD-10-CM | POA: Diagnosis not present

## 2017-02-02 DIAGNOSIS — D631 Anemia in chronic kidney disease: Secondary | ICD-10-CM | POA: Diagnosis not present

## 2017-02-02 DIAGNOSIS — N2581 Secondary hyperparathyroidism of renal origin: Secondary | ICD-10-CM | POA: Diagnosis not present

## 2017-02-04 DIAGNOSIS — N186 End stage renal disease: Secondary | ICD-10-CM | POA: Diagnosis not present

## 2017-02-04 DIAGNOSIS — D631 Anemia in chronic kidney disease: Secondary | ICD-10-CM | POA: Diagnosis not present

## 2017-02-04 DIAGNOSIS — I4891 Unspecified atrial fibrillation: Secondary | ICD-10-CM | POA: Diagnosis not present

## 2017-02-04 DIAGNOSIS — N2581 Secondary hyperparathyroidism of renal origin: Secondary | ICD-10-CM | POA: Diagnosis not present

## 2017-02-06 DIAGNOSIS — N2581 Secondary hyperparathyroidism of renal origin: Secondary | ICD-10-CM | POA: Diagnosis not present

## 2017-02-06 DIAGNOSIS — D631 Anemia in chronic kidney disease: Secondary | ICD-10-CM | POA: Diagnosis not present

## 2017-02-06 DIAGNOSIS — N186 End stage renal disease: Secondary | ICD-10-CM | POA: Diagnosis not present

## 2017-02-09 DIAGNOSIS — N2581 Secondary hyperparathyroidism of renal origin: Secondary | ICD-10-CM | POA: Diagnosis not present

## 2017-02-09 DIAGNOSIS — Z992 Dependence on renal dialysis: Secondary | ICD-10-CM | POA: Diagnosis not present

## 2017-02-09 DIAGNOSIS — D509 Iron deficiency anemia, unspecified: Secondary | ICD-10-CM | POA: Diagnosis not present

## 2017-02-09 DIAGNOSIS — N186 End stage renal disease: Secondary | ICD-10-CM | POA: Diagnosis not present

## 2017-02-09 DIAGNOSIS — D631 Anemia in chronic kidney disease: Secondary | ICD-10-CM | POA: Diagnosis not present

## 2017-02-11 DIAGNOSIS — N2581 Secondary hyperparathyroidism of renal origin: Secondary | ICD-10-CM | POA: Diagnosis not present

## 2017-02-11 DIAGNOSIS — N186 End stage renal disease: Secondary | ICD-10-CM | POA: Diagnosis not present

## 2017-02-11 DIAGNOSIS — Z992 Dependence on renal dialysis: Secondary | ICD-10-CM | POA: Diagnosis not present

## 2017-02-11 DIAGNOSIS — D631 Anemia in chronic kidney disease: Secondary | ICD-10-CM | POA: Diagnosis not present

## 2017-02-11 DIAGNOSIS — D509 Iron deficiency anemia, unspecified: Secondary | ICD-10-CM | POA: Diagnosis not present

## 2017-02-13 DIAGNOSIS — N186 End stage renal disease: Secondary | ICD-10-CM | POA: Diagnosis not present

## 2017-02-13 DIAGNOSIS — D631 Anemia in chronic kidney disease: Secondary | ICD-10-CM | POA: Diagnosis not present

## 2017-02-13 DIAGNOSIS — Z992 Dependence on renal dialysis: Secondary | ICD-10-CM | POA: Diagnosis not present

## 2017-02-13 DIAGNOSIS — D509 Iron deficiency anemia, unspecified: Secondary | ICD-10-CM | POA: Diagnosis not present

## 2017-02-13 DIAGNOSIS — N2581 Secondary hyperparathyroidism of renal origin: Secondary | ICD-10-CM | POA: Diagnosis not present

## 2017-02-16 DIAGNOSIS — N186 End stage renal disease: Secondary | ICD-10-CM | POA: Diagnosis not present

## 2017-02-16 DIAGNOSIS — N2581 Secondary hyperparathyroidism of renal origin: Secondary | ICD-10-CM | POA: Diagnosis not present

## 2017-02-16 DIAGNOSIS — D631 Anemia in chronic kidney disease: Secondary | ICD-10-CM | POA: Diagnosis not present

## 2017-02-16 DIAGNOSIS — Z992 Dependence on renal dialysis: Secondary | ICD-10-CM | POA: Diagnosis not present

## 2017-02-16 DIAGNOSIS — D509 Iron deficiency anemia, unspecified: Secondary | ICD-10-CM | POA: Diagnosis not present

## 2017-02-17 DIAGNOSIS — N186 End stage renal disease: Secondary | ICD-10-CM | POA: Diagnosis not present

## 2017-02-17 DIAGNOSIS — I158 Other secondary hypertension: Secondary | ICD-10-CM | POA: Diagnosis not present

## 2017-02-17 DIAGNOSIS — Z992 Dependence on renal dialysis: Secondary | ICD-10-CM | POA: Diagnosis not present

## 2017-02-18 DIAGNOSIS — N186 End stage renal disease: Secondary | ICD-10-CM | POA: Diagnosis not present

## 2017-02-18 DIAGNOSIS — Z992 Dependence on renal dialysis: Secondary | ICD-10-CM | POA: Diagnosis not present

## 2017-02-18 DIAGNOSIS — N2581 Secondary hyperparathyroidism of renal origin: Secondary | ICD-10-CM | POA: Diagnosis not present

## 2017-02-20 DIAGNOSIS — N2581 Secondary hyperparathyroidism of renal origin: Secondary | ICD-10-CM | POA: Diagnosis not present

## 2017-02-20 DIAGNOSIS — Z992 Dependence on renal dialysis: Secondary | ICD-10-CM | POA: Diagnosis not present

## 2017-02-20 DIAGNOSIS — N186 End stage renal disease: Secondary | ICD-10-CM | POA: Diagnosis not present

## 2017-02-23 DIAGNOSIS — N186 End stage renal disease: Secondary | ICD-10-CM | POA: Diagnosis not present

## 2017-02-23 DIAGNOSIS — N2581 Secondary hyperparathyroidism of renal origin: Secondary | ICD-10-CM | POA: Diagnosis not present

## 2017-02-23 DIAGNOSIS — Z992 Dependence on renal dialysis: Secondary | ICD-10-CM | POA: Diagnosis not present

## 2017-02-25 DIAGNOSIS — N2581 Secondary hyperparathyroidism of renal origin: Secondary | ICD-10-CM | POA: Diagnosis not present

## 2017-02-25 DIAGNOSIS — Z992 Dependence on renal dialysis: Secondary | ICD-10-CM | POA: Diagnosis not present

## 2017-02-25 DIAGNOSIS — N186 End stage renal disease: Secondary | ICD-10-CM | POA: Diagnosis not present

## 2017-02-26 DIAGNOSIS — D631 Anemia in chronic kidney disease: Secondary | ICD-10-CM | POA: Diagnosis not present

## 2017-02-26 DIAGNOSIS — N186 End stage renal disease: Secondary | ICD-10-CM | POA: Diagnosis not present

## 2017-02-26 DIAGNOSIS — N2581 Secondary hyperparathyroidism of renal origin: Secondary | ICD-10-CM | POA: Diagnosis not present

## 2017-02-27 DIAGNOSIS — N2581 Secondary hyperparathyroidism of renal origin: Secondary | ICD-10-CM | POA: Diagnosis not present

## 2017-02-27 DIAGNOSIS — N186 End stage renal disease: Secondary | ICD-10-CM | POA: Diagnosis not present

## 2017-02-27 DIAGNOSIS — Z992 Dependence on renal dialysis: Secondary | ICD-10-CM | POA: Diagnosis not present

## 2017-03-02 DIAGNOSIS — N186 End stage renal disease: Secondary | ICD-10-CM | POA: Diagnosis not present

## 2017-03-02 DIAGNOSIS — D631 Anemia in chronic kidney disease: Secondary | ICD-10-CM | POA: Diagnosis not present

## 2017-03-02 DIAGNOSIS — N2581 Secondary hyperparathyroidism of renal origin: Secondary | ICD-10-CM | POA: Diagnosis not present

## 2017-03-04 DIAGNOSIS — N186 End stage renal disease: Secondary | ICD-10-CM | POA: Diagnosis not present

## 2017-03-04 DIAGNOSIS — D631 Anemia in chronic kidney disease: Secondary | ICD-10-CM | POA: Diagnosis not present

## 2017-03-04 DIAGNOSIS — I4891 Unspecified atrial fibrillation: Secondary | ICD-10-CM | POA: Diagnosis not present

## 2017-03-04 DIAGNOSIS — N2581 Secondary hyperparathyroidism of renal origin: Secondary | ICD-10-CM | POA: Diagnosis not present

## 2017-03-06 DIAGNOSIS — D631 Anemia in chronic kidney disease: Secondary | ICD-10-CM | POA: Diagnosis not present

## 2017-03-06 DIAGNOSIS — N2581 Secondary hyperparathyroidism of renal origin: Secondary | ICD-10-CM | POA: Diagnosis not present

## 2017-03-06 DIAGNOSIS — N186 End stage renal disease: Secondary | ICD-10-CM | POA: Diagnosis not present

## 2017-03-09 DIAGNOSIS — N186 End stage renal disease: Secondary | ICD-10-CM | POA: Diagnosis not present

## 2017-03-09 DIAGNOSIS — D631 Anemia in chronic kidney disease: Secondary | ICD-10-CM | POA: Diagnosis not present

## 2017-03-09 DIAGNOSIS — N2581 Secondary hyperparathyroidism of renal origin: Secondary | ICD-10-CM | POA: Diagnosis not present

## 2017-03-11 DIAGNOSIS — I4891 Unspecified atrial fibrillation: Secondary | ICD-10-CM | POA: Diagnosis not present

## 2017-03-11 DIAGNOSIS — D631 Anemia in chronic kidney disease: Secondary | ICD-10-CM | POA: Diagnosis not present

## 2017-03-11 DIAGNOSIS — N186 End stage renal disease: Secondary | ICD-10-CM | POA: Diagnosis not present

## 2017-03-11 DIAGNOSIS — N2581 Secondary hyperparathyroidism of renal origin: Secondary | ICD-10-CM | POA: Diagnosis not present

## 2017-03-13 DIAGNOSIS — D631 Anemia in chronic kidney disease: Secondary | ICD-10-CM | POA: Diagnosis not present

## 2017-03-13 DIAGNOSIS — N186 End stage renal disease: Secondary | ICD-10-CM | POA: Diagnosis not present

## 2017-03-13 DIAGNOSIS — N2581 Secondary hyperparathyroidism of renal origin: Secondary | ICD-10-CM | POA: Diagnosis not present

## 2017-03-16 DIAGNOSIS — N2581 Secondary hyperparathyroidism of renal origin: Secondary | ICD-10-CM | POA: Diagnosis not present

## 2017-03-16 DIAGNOSIS — D631 Anemia in chronic kidney disease: Secondary | ICD-10-CM | POA: Diagnosis not present

## 2017-03-16 DIAGNOSIS — N186 End stage renal disease: Secondary | ICD-10-CM | POA: Diagnosis not present

## 2017-03-17 DIAGNOSIS — N186 End stage renal disease: Secondary | ICD-10-CM | POA: Diagnosis not present

## 2017-03-17 DIAGNOSIS — I871 Compression of vein: Secondary | ICD-10-CM | POA: Diagnosis not present

## 2017-03-17 DIAGNOSIS — Z992 Dependence on renal dialysis: Secondary | ICD-10-CM | POA: Diagnosis not present

## 2017-03-17 DIAGNOSIS — T82858A Stenosis of vascular prosthetic devices, implants and grafts, initial encounter: Secondary | ICD-10-CM | POA: Diagnosis not present

## 2017-03-18 DIAGNOSIS — N2581 Secondary hyperparathyroidism of renal origin: Secondary | ICD-10-CM | POA: Diagnosis not present

## 2017-03-18 DIAGNOSIS — D631 Anemia in chronic kidney disease: Secondary | ICD-10-CM | POA: Diagnosis not present

## 2017-03-18 DIAGNOSIS — N186 End stage renal disease: Secondary | ICD-10-CM | POA: Diagnosis not present

## 2017-03-18 DIAGNOSIS — I4891 Unspecified atrial fibrillation: Secondary | ICD-10-CM | POA: Diagnosis not present

## 2017-03-20 DIAGNOSIS — N2581 Secondary hyperparathyroidism of renal origin: Secondary | ICD-10-CM | POA: Diagnosis not present

## 2017-03-20 DIAGNOSIS — Z992 Dependence on renal dialysis: Secondary | ICD-10-CM | POA: Diagnosis not present

## 2017-03-20 DIAGNOSIS — N186 End stage renal disease: Secondary | ICD-10-CM | POA: Diagnosis not present

## 2017-03-20 DIAGNOSIS — D631 Anemia in chronic kidney disease: Secondary | ICD-10-CM | POA: Diagnosis not present

## 2017-03-20 DIAGNOSIS — I158 Other secondary hypertension: Secondary | ICD-10-CM | POA: Diagnosis not present

## 2017-03-23 DIAGNOSIS — N2581 Secondary hyperparathyroidism of renal origin: Secondary | ICD-10-CM | POA: Diagnosis not present

## 2017-03-23 DIAGNOSIS — Z23 Encounter for immunization: Secondary | ICD-10-CM | POA: Diagnosis not present

## 2017-03-23 DIAGNOSIS — D631 Anemia in chronic kidney disease: Secondary | ICD-10-CM | POA: Diagnosis not present

## 2017-03-23 DIAGNOSIS — N186 End stage renal disease: Secondary | ICD-10-CM | POA: Diagnosis not present

## 2017-03-24 ENCOUNTER — Other Ambulatory Visit: Payer: Self-pay | Admitting: Family Medicine

## 2017-03-24 DIAGNOSIS — K5900 Constipation, unspecified: Secondary | ICD-10-CM

## 2017-03-24 DIAGNOSIS — I480 Paroxysmal atrial fibrillation: Secondary | ICD-10-CM

## 2017-03-26 DIAGNOSIS — N186 End stage renal disease: Secondary | ICD-10-CM | POA: Diagnosis not present

## 2017-03-26 DIAGNOSIS — Z23 Encounter for immunization: Secondary | ICD-10-CM | POA: Diagnosis not present

## 2017-03-26 DIAGNOSIS — I4891 Unspecified atrial fibrillation: Secondary | ICD-10-CM | POA: Diagnosis not present

## 2017-03-26 DIAGNOSIS — N2581 Secondary hyperparathyroidism of renal origin: Secondary | ICD-10-CM | POA: Diagnosis not present

## 2017-03-26 DIAGNOSIS — D631 Anemia in chronic kidney disease: Secondary | ICD-10-CM | POA: Diagnosis not present

## 2017-03-27 DIAGNOSIS — Z23 Encounter for immunization: Secondary | ICD-10-CM | POA: Diagnosis not present

## 2017-03-27 DIAGNOSIS — N186 End stage renal disease: Secondary | ICD-10-CM | POA: Diagnosis not present

## 2017-03-27 DIAGNOSIS — N2581 Secondary hyperparathyroidism of renal origin: Secondary | ICD-10-CM | POA: Diagnosis not present

## 2017-03-27 DIAGNOSIS — D631 Anemia in chronic kidney disease: Secondary | ICD-10-CM | POA: Diagnosis not present

## 2017-03-30 DIAGNOSIS — N186 End stage renal disease: Secondary | ICD-10-CM | POA: Diagnosis not present

## 2017-03-30 DIAGNOSIS — Z23 Encounter for immunization: Secondary | ICD-10-CM | POA: Diagnosis not present

## 2017-03-30 DIAGNOSIS — D631 Anemia in chronic kidney disease: Secondary | ICD-10-CM | POA: Diagnosis not present

## 2017-03-30 DIAGNOSIS — N2581 Secondary hyperparathyroidism of renal origin: Secondary | ICD-10-CM | POA: Diagnosis not present

## 2017-04-01 DIAGNOSIS — N186 End stage renal disease: Secondary | ICD-10-CM | POA: Diagnosis not present

## 2017-04-01 DIAGNOSIS — Z23 Encounter for immunization: Secondary | ICD-10-CM | POA: Diagnosis not present

## 2017-04-01 DIAGNOSIS — I4891 Unspecified atrial fibrillation: Secondary | ICD-10-CM | POA: Diagnosis not present

## 2017-04-01 DIAGNOSIS — D631 Anemia in chronic kidney disease: Secondary | ICD-10-CM | POA: Diagnosis not present

## 2017-04-01 DIAGNOSIS — N2581 Secondary hyperparathyroidism of renal origin: Secondary | ICD-10-CM | POA: Diagnosis not present

## 2017-04-03 DIAGNOSIS — N2581 Secondary hyperparathyroidism of renal origin: Secondary | ICD-10-CM | POA: Diagnosis not present

## 2017-04-03 DIAGNOSIS — Z23 Encounter for immunization: Secondary | ICD-10-CM | POA: Diagnosis not present

## 2017-04-03 DIAGNOSIS — N186 End stage renal disease: Secondary | ICD-10-CM | POA: Diagnosis not present

## 2017-04-03 DIAGNOSIS — D631 Anemia in chronic kidney disease: Secondary | ICD-10-CM | POA: Diagnosis not present

## 2017-04-06 DIAGNOSIS — N2581 Secondary hyperparathyroidism of renal origin: Secondary | ICD-10-CM | POA: Diagnosis not present

## 2017-04-06 DIAGNOSIS — Z23 Encounter for immunization: Secondary | ICD-10-CM | POA: Diagnosis not present

## 2017-04-06 DIAGNOSIS — D631 Anemia in chronic kidney disease: Secondary | ICD-10-CM | POA: Diagnosis not present

## 2017-04-06 DIAGNOSIS — N186 End stage renal disease: Secondary | ICD-10-CM | POA: Diagnosis not present

## 2017-04-08 DIAGNOSIS — N2581 Secondary hyperparathyroidism of renal origin: Secondary | ICD-10-CM | POA: Diagnosis not present

## 2017-04-08 DIAGNOSIS — D631 Anemia in chronic kidney disease: Secondary | ICD-10-CM | POA: Diagnosis not present

## 2017-04-08 DIAGNOSIS — N186 End stage renal disease: Secondary | ICD-10-CM | POA: Diagnosis not present

## 2017-04-08 DIAGNOSIS — Z23 Encounter for immunization: Secondary | ICD-10-CM | POA: Diagnosis not present

## 2017-04-08 DIAGNOSIS — I4891 Unspecified atrial fibrillation: Secondary | ICD-10-CM | POA: Diagnosis not present

## 2017-04-10 DIAGNOSIS — Z23 Encounter for immunization: Secondary | ICD-10-CM | POA: Diagnosis not present

## 2017-04-10 DIAGNOSIS — N2581 Secondary hyperparathyroidism of renal origin: Secondary | ICD-10-CM | POA: Diagnosis not present

## 2017-04-10 DIAGNOSIS — D631 Anemia in chronic kidney disease: Secondary | ICD-10-CM | POA: Diagnosis not present

## 2017-04-10 DIAGNOSIS — N186 End stage renal disease: Secondary | ICD-10-CM | POA: Diagnosis not present

## 2017-04-13 DIAGNOSIS — N2581 Secondary hyperparathyroidism of renal origin: Secondary | ICD-10-CM | POA: Diagnosis not present

## 2017-04-13 DIAGNOSIS — N186 End stage renal disease: Secondary | ICD-10-CM | POA: Diagnosis not present

## 2017-04-13 DIAGNOSIS — Z23 Encounter for immunization: Secondary | ICD-10-CM | POA: Diagnosis not present

## 2017-04-13 DIAGNOSIS — D631 Anemia in chronic kidney disease: Secondary | ICD-10-CM | POA: Diagnosis not present

## 2017-04-15 DIAGNOSIS — Z23 Encounter for immunization: Secondary | ICD-10-CM | POA: Diagnosis not present

## 2017-04-15 DIAGNOSIS — N186 End stage renal disease: Secondary | ICD-10-CM | POA: Diagnosis not present

## 2017-04-15 DIAGNOSIS — D631 Anemia in chronic kidney disease: Secondary | ICD-10-CM | POA: Diagnosis not present

## 2017-04-15 DIAGNOSIS — I4891 Unspecified atrial fibrillation: Secondary | ICD-10-CM | POA: Diagnosis not present

## 2017-04-15 DIAGNOSIS — N2581 Secondary hyperparathyroidism of renal origin: Secondary | ICD-10-CM | POA: Diagnosis not present

## 2017-04-17 DIAGNOSIS — N2581 Secondary hyperparathyroidism of renal origin: Secondary | ICD-10-CM | POA: Diagnosis not present

## 2017-04-17 DIAGNOSIS — D631 Anemia in chronic kidney disease: Secondary | ICD-10-CM | POA: Diagnosis not present

## 2017-04-17 DIAGNOSIS — Z23 Encounter for immunization: Secondary | ICD-10-CM | POA: Diagnosis not present

## 2017-04-17 DIAGNOSIS — N186 End stage renal disease: Secondary | ICD-10-CM | POA: Diagnosis not present

## 2017-04-19 DIAGNOSIS — I158 Other secondary hypertension: Secondary | ICD-10-CM | POA: Diagnosis not present

## 2017-04-19 DIAGNOSIS — Z992 Dependence on renal dialysis: Secondary | ICD-10-CM | POA: Diagnosis not present

## 2017-04-19 DIAGNOSIS — N186 End stage renal disease: Secondary | ICD-10-CM | POA: Diagnosis not present

## 2017-04-20 DIAGNOSIS — N2581 Secondary hyperparathyroidism of renal origin: Secondary | ICD-10-CM | POA: Diagnosis not present

## 2017-04-20 DIAGNOSIS — N186 End stage renal disease: Secondary | ICD-10-CM | POA: Diagnosis not present

## 2017-04-20 DIAGNOSIS — D631 Anemia in chronic kidney disease: Secondary | ICD-10-CM | POA: Diagnosis not present

## 2017-04-22 DIAGNOSIS — N2581 Secondary hyperparathyroidism of renal origin: Secondary | ICD-10-CM | POA: Diagnosis not present

## 2017-04-22 DIAGNOSIS — I4891 Unspecified atrial fibrillation: Secondary | ICD-10-CM | POA: Diagnosis not present

## 2017-04-22 DIAGNOSIS — D631 Anemia in chronic kidney disease: Secondary | ICD-10-CM | POA: Diagnosis not present

## 2017-04-22 DIAGNOSIS — N186 End stage renal disease: Secondary | ICD-10-CM | POA: Diagnosis not present

## 2017-04-24 DIAGNOSIS — N186 End stage renal disease: Secondary | ICD-10-CM | POA: Diagnosis not present

## 2017-04-24 DIAGNOSIS — D631 Anemia in chronic kidney disease: Secondary | ICD-10-CM | POA: Diagnosis not present

## 2017-04-24 DIAGNOSIS — N2581 Secondary hyperparathyroidism of renal origin: Secondary | ICD-10-CM | POA: Diagnosis not present

## 2017-04-27 DIAGNOSIS — D631 Anemia in chronic kidney disease: Secondary | ICD-10-CM | POA: Diagnosis not present

## 2017-04-27 DIAGNOSIS — N186 End stage renal disease: Secondary | ICD-10-CM | POA: Diagnosis not present

## 2017-04-27 DIAGNOSIS — N2581 Secondary hyperparathyroidism of renal origin: Secondary | ICD-10-CM | POA: Diagnosis not present

## 2017-04-29 DIAGNOSIS — N186 End stage renal disease: Secondary | ICD-10-CM | POA: Diagnosis not present

## 2017-04-29 DIAGNOSIS — D631 Anemia in chronic kidney disease: Secondary | ICD-10-CM | POA: Diagnosis not present

## 2017-04-29 DIAGNOSIS — N2581 Secondary hyperparathyroidism of renal origin: Secondary | ICD-10-CM | POA: Diagnosis not present

## 2017-05-01 DIAGNOSIS — N2581 Secondary hyperparathyroidism of renal origin: Secondary | ICD-10-CM | POA: Diagnosis not present

## 2017-05-01 DIAGNOSIS — D631 Anemia in chronic kidney disease: Secondary | ICD-10-CM | POA: Diagnosis not present

## 2017-05-01 DIAGNOSIS — N186 End stage renal disease: Secondary | ICD-10-CM | POA: Diagnosis not present

## 2017-05-01 DIAGNOSIS — I4891 Unspecified atrial fibrillation: Secondary | ICD-10-CM | POA: Diagnosis not present

## 2017-05-04 DIAGNOSIS — D631 Anemia in chronic kidney disease: Secondary | ICD-10-CM | POA: Diagnosis not present

## 2017-05-04 DIAGNOSIS — N186 End stage renal disease: Secondary | ICD-10-CM | POA: Diagnosis not present

## 2017-05-04 DIAGNOSIS — N2581 Secondary hyperparathyroidism of renal origin: Secondary | ICD-10-CM | POA: Diagnosis not present

## 2017-05-06 DIAGNOSIS — I4891 Unspecified atrial fibrillation: Secondary | ICD-10-CM | POA: Diagnosis not present

## 2017-05-06 DIAGNOSIS — D631 Anemia in chronic kidney disease: Secondary | ICD-10-CM | POA: Diagnosis not present

## 2017-05-06 DIAGNOSIS — N2581 Secondary hyperparathyroidism of renal origin: Secondary | ICD-10-CM | POA: Diagnosis not present

## 2017-05-06 DIAGNOSIS — N186 End stage renal disease: Secondary | ICD-10-CM | POA: Diagnosis not present

## 2017-05-08 DIAGNOSIS — D631 Anemia in chronic kidney disease: Secondary | ICD-10-CM | POA: Diagnosis not present

## 2017-05-08 DIAGNOSIS — N2581 Secondary hyperparathyroidism of renal origin: Secondary | ICD-10-CM | POA: Diagnosis not present

## 2017-05-08 DIAGNOSIS — N186 End stage renal disease: Secondary | ICD-10-CM | POA: Diagnosis not present

## 2017-05-11 DIAGNOSIS — D631 Anemia in chronic kidney disease: Secondary | ICD-10-CM | POA: Diagnosis not present

## 2017-05-11 DIAGNOSIS — Z1231 Encounter for screening mammogram for malignant neoplasm of breast: Secondary | ICD-10-CM | POA: Diagnosis not present

## 2017-05-11 DIAGNOSIS — N186 End stage renal disease: Secondary | ICD-10-CM | POA: Diagnosis not present

## 2017-05-11 DIAGNOSIS — N2581 Secondary hyperparathyroidism of renal origin: Secondary | ICD-10-CM | POA: Diagnosis not present

## 2017-05-11 LAB — HM MAMMOGRAPHY

## 2017-05-13 DIAGNOSIS — N186 End stage renal disease: Secondary | ICD-10-CM | POA: Diagnosis not present

## 2017-05-13 DIAGNOSIS — N2581 Secondary hyperparathyroidism of renal origin: Secondary | ICD-10-CM | POA: Diagnosis not present

## 2017-05-13 DIAGNOSIS — E1129 Type 2 diabetes mellitus with other diabetic kidney complication: Secondary | ICD-10-CM | POA: Diagnosis not present

## 2017-05-13 DIAGNOSIS — D631 Anemia in chronic kidney disease: Secondary | ICD-10-CM | POA: Diagnosis not present

## 2017-05-13 DIAGNOSIS — I4891 Unspecified atrial fibrillation: Secondary | ICD-10-CM | POA: Diagnosis not present

## 2017-05-14 ENCOUNTER — Telehealth (HOSPITAL_COMMUNITY): Payer: Self-pay | Admitting: *Deleted

## 2017-05-14 ENCOUNTER — Other Ambulatory Visit: Payer: Self-pay | Admitting: Vascular Surgery

## 2017-05-14 DIAGNOSIS — T82510A Breakdown (mechanical) of surgically created arteriovenous fistula, initial encounter: Secondary | ICD-10-CM

## 2017-05-14 NOTE — Telephone Encounter (Signed)
Unable to reach [place] at her listed home number so I called the cell phone number.  Her daughter said she takes her mother to all her appointments and schedules them.  She also stated that she had taken her mother to see another provider over her hand issues and they did a test and said her arm was fine.  Daughter will discuss with her mother and Dr. Jamal Maes who referred her to our office and if they feel an appointment with one of our surgeons is still warranted she will call back and make an appointment.  Referral packet with Carlynn Spry

## 2017-05-15 ENCOUNTER — Encounter: Payer: Self-pay | Admitting: Vascular Surgery

## 2017-05-15 ENCOUNTER — Ambulatory Visit (INDEPENDENT_AMBULATORY_CARE_PROVIDER_SITE_OTHER): Payer: Medicare Other | Admitting: Vascular Surgery

## 2017-05-15 ENCOUNTER — Ambulatory Visit (HOSPITAL_COMMUNITY)
Admission: RE | Admit: 2017-05-15 | Discharge: 2017-05-15 | Disposition: A | Discharge status: Home / Self Care | Payer: Medicare Other | Source: Ambulatory Visit | Attending: Vascular Surgery | Admitting: Vascular Surgery

## 2017-05-15 VITALS — BP 177/69 | HR 62 | Resp 18 | Ht 65.0 in | Wt 243.0 lb

## 2017-05-15 DIAGNOSIS — D631 Anemia in chronic kidney disease: Secondary | ICD-10-CM | POA: Diagnosis not present

## 2017-05-15 DIAGNOSIS — N186 End stage renal disease: Secondary | ICD-10-CM

## 2017-05-15 DIAGNOSIS — Z5181 Encounter for therapeutic drug level monitoring: Secondary | ICD-10-CM | POA: Diagnosis not present

## 2017-05-15 DIAGNOSIS — Z992 Dependence on renal dialysis: Secondary | ICD-10-CM | POA: Diagnosis not present

## 2017-05-15 DIAGNOSIS — T82898A Other specified complication of vascular prosthetic devices, implants and grafts, initial encounter: Secondary | ICD-10-CM | POA: Insufficient documentation

## 2017-05-15 DIAGNOSIS — N2581 Secondary hyperparathyroidism of renal origin: Secondary | ICD-10-CM | POA: Diagnosis not present

## 2017-05-15 DIAGNOSIS — T82510A Breakdown (mechanical) of surgically created arteriovenous fistula, initial encounter: Secondary | ICD-10-CM | POA: Diagnosis not present

## 2017-05-15 DIAGNOSIS — I4891 Unspecified atrial fibrillation: Secondary | ICD-10-CM | POA: Diagnosis not present

## 2017-05-15 DIAGNOSIS — Z7901 Long term (current) use of anticoagulants: Secondary | ICD-10-CM | POA: Diagnosis not present

## 2017-05-15 DIAGNOSIS — X58XXXA Exposure to other specified factors, initial encounter: Secondary | ICD-10-CM | POA: Diagnosis not present

## 2017-05-15 NOTE — Progress Notes (Signed)
[name], female   DOB: 02-25-1946, 71 y.o.   MRN: [id number]  Reason for Consult: Follow-up (eval for steal syndrome R hand - ref by Lorrene Reid)   Referred by [name], [name], MD  Subjective:     HPI:  Jane [name] is a 71 y.o. female with a history of end-stage renal disease on Coumadin and on dialysis via right radiocephalic AV fistula placed in [place] a few years back.  She has had multiple interventional procedures and has stents in her upper arm that have occluded but the fistula remains patent.  She now presents for pain present in her right hand that occurs only when on dialysis.  She states that her hand is very cold and blue and is very painful.  She is attempted to wear a glove and also use warm blankets but this does not help.  She is reluctant to have anything done that would compromise her AV fistula.  Past Medical History:  Diagnosis Date  . Acid reflux   . Atrial fibrillation ([place])   . Hypertension   . Renal disorder   . Sleep apnea    Family History  Problem Relation Age of Onset  . Heart failure Mother   . Stroke Mother   . Other Father    Past Surgical History:  Procedure Laterality Date  . IR GENERIC HISTORICAL  07/09/2016   IR US GUIDE VASC ACCESS RIGHT 07/09/2016 Arne Cleveland, MD [facility]  . IR GENERIC HISTORICAL  07/09/2016   IR FLUORO GUIDE CV LINE RIGHT 07/09/2016 Arne Cleveland, MD [facility]  . KNEE ARTHROPLASTY    . LAPAROSCOPIC SIGMOID COLECTOMY N/A 07/11/2016   Procedure: LAPAROSCOPIC SIGMOID COLECTOMY;  Surgeon: Clovis Riley, MD;  Location: [place];  Service: General;  Laterality: N/A;  . TUBAL LIGATION      Short Social History:  Social History  Substance Use Topics  . Smoking status: Never Smoker  . Smokeless tobacco: Never Used  . Alcohol use No    Allergies  Allergen Reactions  . Tape Other (See Comments)    PLASTIC TAPE TEARS OFF THE SKIN AND BRUISES IT TERRIBLY!!  . Latex Rash    Current Outpatient  Prescriptions  Medication Sig Dispense Refill  . albuterol (PROVENTIL HFA;VENTOLIN HFA) 108 (90 Base) MCG/ACT inhaler Inhale 1 puff into the lungs every 6 (six) hours as needed for wheezing or shortness of breath.    . Amino Acids-Protein Hydrolys (FEEDING SUPPLEMENT, PRO-STAT SUGAR FREE 64,) LIQD Take 30 mLs by mouth 2 (two) times daily. 900 mL 0  . amiodarone (PACERONE) 200 MG tablet TAKE 1 TABLET BY MOUTH ONCE DAILY 90 tablet 1  . Biotin 10000 MCG TABS Take 1 tablet by mouth daily.    . carvedilol (COREG) 25 MG tablet Take 2 tablets (50 mg total) by mouth 2 (two) times daily with a meal. 360 tablet 2  . cinacalcet (SENSIPAR) 30 MG tablet Take 30 mg by mouth daily.    Marland Kitchen LINZESS 145 MCG CAPS capsule TAKE 1 CAPSULE BY MOUTH ONCE DAILY BEFORE BREAKFAST 90 capsule 1  . losartan (COZAAR) 100 MG tablet Take 1 tablet (100 mg total) by mouth daily. 90 tablet 2  . multivitamin (RENA-VIT) TABS tablet Take 1 tablet by mouth daily.    . polyethylene glycol powder (GLYCOLAX/MIRALAX) powder Take 17 g by mouth every other day. [place]    . Probiotic Product (PROBIOTIC PO) Take 1 tablet by mouth daily.    Marland Kitchen  sevelamer carbonate (RENVELA) 800 MG tablet Take 800-1,600 mg by mouth See admin instructions. 1,600 mg after meals TWO times a day and 800 mg after each snack    . warfarin (COUMADIN) 5 MG tablet As directed. 50 tablet 1  . docusate sodium (PHILLIPS STOOL SOFTENER) 100 MG capsule Take 200 mg by mouth 2 (two) times daily.    Marland Kitchen oxyCODONE-acetaminophen (PERCOCET/ROXICET) 5-325 MG tablet Take 1-2 tablets by mouth every 6 (six) hours as needed for severe pain. (Patient not taking: Reported on 05/15/2017) 15 tablet 0   No current facility-administered medications for this visit.     Review of Systems  Constitutional:  Constitutional negative. HENT: HENT negative.  Eyes: Eyes negative.  Respiratory: Respiratory negative.  Cardiovascular: Cardiovascular negative.  GI: Gastrointestinal negative.    Musculoskeletal: Musculoskeletal negative.       Hand pain on hd Skin: Skin negative.  Neurological: Neurological negative. Hematologic: Hematologic/lymphatic negative.  Psychiatric: Psychiatric negative.        Objective:  Objective   Vitals:   05/15/17 1531 05/15/17 1533  BP: (!) 180/71 (!) 177/69  Pulse: 62   Resp: 18   SpO2: 100%   Weight: 243 lb (110.2 kg)   Height: 5\' 5"  (1.651 m)    Body mass index is 40.44 kg/m.  Physical Exam  Constitutional: She is oriented to person, place, and time. She appears well-developed.  HENT:  Head: Normocephalic.  Eyes: Pupils are equal, round, and reactive to light.  Neck: Normal range of motion.  Cardiovascular:  Her right ulnar pulse is 2+ at baseline The radial artery signal is present and improves with compression of the fistula The palmar arch signal is strong and unchanged with compression of the fistula  Pulmonary/Chest: Effort normal.  Abdominal: Soft. She exhibits no mass.  Musculoskeletal: Normal range of motion. She exhibits no edema.  Neurological: She is alert and oriented to person, place, and time.  Skin: Skin is warm and dry.  Psychiatric: She has a normal mood and affect. Her behavior is normal. Judgment and thought content normal.    Data: I have independently interpreted her dialysis duplex evaluation which demonstrates a very high velocity at the anastomosis.  The radial artery flow is retrograde at baseline and there is no flow detected with compression of the fistula.  If the volume flow of the fistula is 325 cc/min.     Assessment/Plan:     71 year old female presents for evaluation of her right arm fistula with her chief complaint being pain while on dialysis.  The steal study today is difficult to reconcile given that her fistula feels very strong on physical exam despite only 325 cc/min of flow.  The direction of flow in the radial artery is also incongruent with my physical exam where the flow improved  with compression of the fistula possibly consistent with steal.  The ulnar artery is palpable in the palmar arch is unchanged with compression of the graft.  I have offered her to investigate with aortogram and right upper extremity angiogram although I do not think she has underlying arterial disease given the palpable ulnar pulse and a strong radial signal with compression of the fistula.  From there we will need to progress with either decreasing the flow through the fistula but given that it is low I think it would be very high risk for compromising the fistula.  The other alternative would be to ligate the radial artery distally to the fistula to prevent steal in a retrograde  fashion.  At this time she is uninterested in any procedures that could compromise her fistula.  She states she will call to follow-up should the pain become unbearable.     Waynetta Sandy MD [facility]

## 2017-05-18 DIAGNOSIS — D631 Anemia in chronic kidney disease: Secondary | ICD-10-CM | POA: Diagnosis not present

## 2017-05-18 DIAGNOSIS — N186 End stage renal disease: Secondary | ICD-10-CM | POA: Diagnosis not present

## 2017-05-18 DIAGNOSIS — N2581 Secondary hyperparathyroidism of renal origin: Secondary | ICD-10-CM | POA: Diagnosis not present

## 2017-05-20 ENCOUNTER — Encounter: Payer: Self-pay | Admitting: Family Medicine

## 2017-05-20 DIAGNOSIS — I158 Other secondary hypertension: Secondary | ICD-10-CM | POA: Diagnosis not present

## 2017-05-20 DIAGNOSIS — I4891 Unspecified atrial fibrillation: Secondary | ICD-10-CM | POA: Diagnosis not present

## 2017-05-20 DIAGNOSIS — N2581 Secondary hyperparathyroidism of renal origin: Secondary | ICD-10-CM | POA: Diagnosis not present

## 2017-05-20 DIAGNOSIS — D631 Anemia in chronic kidney disease: Secondary | ICD-10-CM | POA: Diagnosis not present

## 2017-05-20 DIAGNOSIS — Z992 Dependence on renal dialysis: Secondary | ICD-10-CM | POA: Diagnosis not present

## 2017-05-20 DIAGNOSIS — N186 End stage renal disease: Secondary | ICD-10-CM | POA: Diagnosis not present

## 2017-05-22 ENCOUNTER — Ambulatory Visit: Payer: Medicare Other | Admitting: Family Medicine

## 2017-05-22 DIAGNOSIS — Z7901 Long term (current) use of anticoagulants: Secondary | ICD-10-CM | POA: Diagnosis not present

## 2017-05-22 DIAGNOSIS — Z5181 Encounter for therapeutic drug level monitoring: Secondary | ICD-10-CM | POA: Diagnosis not present

## 2017-05-22 DIAGNOSIS — I4891 Unspecified atrial fibrillation: Secondary | ICD-10-CM | POA: Diagnosis not present

## 2017-05-22 DIAGNOSIS — N186 End stage renal disease: Secondary | ICD-10-CM | POA: Diagnosis not present

## 2017-05-22 DIAGNOSIS — D631 Anemia in chronic kidney disease: Secondary | ICD-10-CM | POA: Diagnosis not present

## 2017-05-22 DIAGNOSIS — N2581 Secondary hyperparathyroidism of renal origin: Secondary | ICD-10-CM | POA: Diagnosis not present

## 2017-05-25 DIAGNOSIS — N2581 Secondary hyperparathyroidism of renal origin: Secondary | ICD-10-CM | POA: Diagnosis not present

## 2017-05-25 DIAGNOSIS — N186 End stage renal disease: Secondary | ICD-10-CM | POA: Diagnosis not present

## 2017-05-25 DIAGNOSIS — D631 Anemia in chronic kidney disease: Secondary | ICD-10-CM | POA: Diagnosis not present

## 2017-05-27 DIAGNOSIS — I4891 Unspecified atrial fibrillation: Secondary | ICD-10-CM | POA: Diagnosis not present

## 2017-05-27 DIAGNOSIS — N186 End stage renal disease: Secondary | ICD-10-CM | POA: Diagnosis not present

## 2017-05-27 DIAGNOSIS — D631 Anemia in chronic kidney disease: Secondary | ICD-10-CM | POA: Diagnosis not present

## 2017-05-27 DIAGNOSIS — N2581 Secondary hyperparathyroidism of renal origin: Secondary | ICD-10-CM | POA: Diagnosis not present

## 2017-05-29 DIAGNOSIS — N186 End stage renal disease: Secondary | ICD-10-CM | POA: Diagnosis not present

## 2017-05-29 DIAGNOSIS — D631 Anemia in chronic kidney disease: Secondary | ICD-10-CM | POA: Diagnosis not present

## 2017-05-29 DIAGNOSIS — N2581 Secondary hyperparathyroidism of renal origin: Secondary | ICD-10-CM | POA: Diagnosis not present

## 2017-06-01 DIAGNOSIS — N186 End stage renal disease: Secondary | ICD-10-CM | POA: Diagnosis not present

## 2017-06-01 DIAGNOSIS — D631 Anemia in chronic kidney disease: Secondary | ICD-10-CM | POA: Diagnosis not present

## 2017-06-01 DIAGNOSIS — N2581 Secondary hyperparathyroidism of renal origin: Secondary | ICD-10-CM | POA: Diagnosis not present

## 2017-06-03 DIAGNOSIS — N2581 Secondary hyperparathyroidism of renal origin: Secondary | ICD-10-CM | POA: Diagnosis not present

## 2017-06-03 DIAGNOSIS — D631 Anemia in chronic kidney disease: Secondary | ICD-10-CM | POA: Diagnosis not present

## 2017-06-03 DIAGNOSIS — N186 End stage renal disease: Secondary | ICD-10-CM | POA: Diagnosis not present

## 2017-06-03 DIAGNOSIS — I4891 Unspecified atrial fibrillation: Secondary | ICD-10-CM | POA: Diagnosis not present

## 2017-06-05 DIAGNOSIS — N186 End stage renal disease: Secondary | ICD-10-CM | POA: Diagnosis not present

## 2017-06-05 DIAGNOSIS — N2581 Secondary hyperparathyroidism of renal origin: Secondary | ICD-10-CM | POA: Diagnosis not present

## 2017-06-05 DIAGNOSIS — D631 Anemia in chronic kidney disease: Secondary | ICD-10-CM | POA: Diagnosis not present

## 2017-06-07 DIAGNOSIS — N186 End stage renal disease: Secondary | ICD-10-CM | POA: Diagnosis not present

## 2017-06-07 DIAGNOSIS — D631 Anemia in chronic kidney disease: Secondary | ICD-10-CM | POA: Diagnosis not present

## 2017-06-07 DIAGNOSIS — N2581 Secondary hyperparathyroidism of renal origin: Secondary | ICD-10-CM | POA: Diagnosis not present

## 2017-06-09 DIAGNOSIS — D631 Anemia in chronic kidney disease: Secondary | ICD-10-CM | POA: Diagnosis not present

## 2017-06-09 DIAGNOSIS — N2581 Secondary hyperparathyroidism of renal origin: Secondary | ICD-10-CM | POA: Diagnosis not present

## 2017-06-09 DIAGNOSIS — I4891 Unspecified atrial fibrillation: Secondary | ICD-10-CM | POA: Diagnosis not present

## 2017-06-09 DIAGNOSIS — N186 End stage renal disease: Secondary | ICD-10-CM | POA: Diagnosis not present

## 2017-06-12 DIAGNOSIS — N186 End stage renal disease: Secondary | ICD-10-CM | POA: Diagnosis not present

## 2017-06-12 DIAGNOSIS — N2581 Secondary hyperparathyroidism of renal origin: Secondary | ICD-10-CM | POA: Diagnosis not present

## 2017-06-12 DIAGNOSIS — D631 Anemia in chronic kidney disease: Secondary | ICD-10-CM | POA: Diagnosis not present

## 2017-06-15 DIAGNOSIS — N186 End stage renal disease: Secondary | ICD-10-CM | POA: Diagnosis not present

## 2017-06-15 DIAGNOSIS — N2581 Secondary hyperparathyroidism of renal origin: Secondary | ICD-10-CM | POA: Diagnosis not present

## 2017-06-15 DIAGNOSIS — D631 Anemia in chronic kidney disease: Secondary | ICD-10-CM | POA: Diagnosis not present

## 2017-06-15 NOTE — Progress Notes (Signed)
HPI:   [name] is a 71 y.o. female, who is here today for 5 months follow up.   She was last seen on 12/19/16.  Since her last OV she has followed with pain clinic (Dr Wess Botts) and vascular (Dr Donzetta Matters). She is having trouble with A-V fistula, having hand numbness. She tells me that she refused surgical treatment.  HTN: Currently she is on Coreg 50 mg twice daily and Cozaar 100 mg daily. Hx of end-stage renal disease on hemodialysis.  Reporting low BP's after dialysis, so she is taking meds after.   Atrial fibrillation on Coumadin, pharmacist at [facility] adjust her Coumadin dose according to INR. No unusual/severe headache, visual changes, chest pain, dyspnea, or focal deficit.  She follows with cardiologist next month, 07/01/17.  Constipation: Last office visit she was started on Linzess 145 mcg daily. She is having bowel movements every day but medication is causing diarrhea. Having daily stools helps with abdominal discomfort.  Diarrhea causes irritation, hemorrhoids.  Denies blood in stool or melena.  She thinks she has acid reflux because she is feeing "sick" sometimes. This has been going on for a while and getting worse. Regurgitation and sometimes vomiting "saliva/mucus." She was on Prilosec years ago and it helped.  She has not identified exacerbating or alleviating factors. Symptoms worse in the morning and at night.   Obesity:  Dietary changes since last OV: None Exercise: She is walking more than she did before.  She has noted that some clothes fit better.   Wt Readings from Last 3 Encounters:  06/16/17 246 lb 4 oz (111.7 kg)  05/15/17 243 lb (110.2 kg)  12/19/16 239 lb 4 oz (108.5 kg)    Right upper back pain that started 4 days ago when she got up from bed. She has not had injuries. "Really bad" pain, not radiated. Exacerbated by movement. No alleviating factors identified.  No associated rash or local edema/erythema. She has tried  local heat.  Hx of chronic lower pain.   Review of Systems  Constitutional: Positive for fatigue. Negative for activity change, appetite change and fever.  HENT: Negative for mouth sores, nosebleeds and trouble swallowing.   Eyes: Negative for redness and visual disturbance.  Respiratory: Negative for cough, shortness of breath and wheezing.   Cardiovascular: Negative for chest pain, palpitations and leg swelling.  Gastrointestinal: Positive for diarrhea and nausea. Negative for abdominal pain and vomiting.  Endocrine: Negative for cold intolerance and heat intolerance.  Musculoskeletal: Positive for back pain. Negative for gait problem.  Skin: Negative for rash and wound.  Neurological: Negative for syncope, weakness and headaches.  Psychiatric/Behavioral: Negative for confusion. The patient is nervous/anxious.      Current Outpatient Medications on File Prior to Visit  Medication Sig Dispense Refill  . albuterol (PROVENTIL HFA;VENTOLIN HFA) 108 (90 Base) MCG/ACT inhaler Inhale 1 puff into the lungs every 6 (six) hours as needed for wheezing or shortness of breath.    . Amino Acids-Protein Hydrolys (FEEDING SUPPLEMENT, PRO-STAT SUGAR FREE 64,) LIQD Take 30 mLs by mouth 2 (two) times daily. 900 mL 0  . amiodarone (PACERONE) 200 MG tablet TAKE 1 TABLET BY MOUTH ONCE DAILY 90 tablet 1  . carvedilol (COREG) 25 MG tablet Take 2 tablets (50 mg total) by mouth 2 (two) times daily with a meal. 360 tablet 2  . cinacalcet (SENSIPAR) 30 MG tablet Take 30 mg by mouth daily.    Marland Kitchen losartan (COZAAR) 100 MG tablet Take  1 tablet (100 mg total) by mouth daily. 90 tablet 2  . multivitamin (RENA-VIT) TABS tablet Take 1 tablet by mouth daily.    Marland Kitchen oxyCODONE-acetaminophen (PERCOCET/ROXICET) 5-325 MG tablet Take 1-2 tablets by mouth every 6 (six) hours as needed for severe pain. 15 tablet 0  . polyethylene glycol powder (GLYCOLAX/MIRALAX) powder Take 17 g by mouth every other day. [place]    .  Probiotic Product (PROBIOTIC PO) Take 1 tablet by mouth daily.    . sevelamer carbonate (RENVELA) 800 MG tablet Take 800-1,600 mg by mouth See admin instructions. 1,600 mg after meals TWO times a day and 800 mg after each snack    . warfarin (COUMADIN) 5 MG tablet As directed. 50 tablet 1   No current facility-administered medications on file prior to visit.      Past Medical History:  Diagnosis Date  . Acid reflux   . Atrial fibrillation ([place])   . Hypertension   . Renal disorder   . Sleep apnea    Allergies  Allergen Reactions  . Penicillins Rash  . Sulfa Antibiotics Rash  . Tape Other (See Comments)    PLASTIC TAPE TEARS OFF THE SKIN AND BRUISES IT TERRIBLY!! GETS ITCHY AND RASH FROM PAPER AND PLASTIC TAPES  . Latex Rash    Social History   Socioeconomic History  . Marital status: Single    Spouse name: None  . Number of children: None  . Years of education: None  . Highest education level: None  Social Needs  . Financial resource strain: None  . Food insecurity - worry: None  . Food insecurity - inability: None  . Transportation needs - medical: None  . Transportation needs - non-medical: None  Occupational History  . None  Tobacco Use  . Smoking status: Never Smoker  . Smokeless tobacco: Never Used  Substance and Sexual Activity  . Alcohol use: No  . Drug use: No  . Sexual activity: No  Other Topics Concern  . None  Social History Narrative  . None    Vitals:   06/16/17 0723  BP: 132/77  Pulse: 72  Resp: 16  Temp: 97.9 F (36.6 C)  SpO2: 98%   Body mass index is 40.98 kg/m.   Physical Exam  Nursing note and vitals reviewed. Constitutional: She is oriented to person, place, and time. She appears well-developed. No distress.  HENT:  Head: Normocephalic and atraumatic.  Mouth/Throat: Oropharynx is clear and moist and mucous membranes are normal.  Eyes: Conjunctivae are normal. Pupils are equal, round, and reactive to light.  Cardiovascular:  Normal rate and regular rhythm.  Occasional extrasystoles are present.  No murmur heard. Pulses:      Dorsalis pedis pulses are 2+ on the right side, and 2+ on the left side.  Respiratory: Effort normal and breath sounds normal. No respiratory distress.  GI: Soft. She exhibits no mass. There is no hepatomegaly. There is no tenderness.  Musculoskeletal: She exhibits edema (1+ pitting LE edema, bilateral.).       Thoracic back: She exhibits tenderness and spasm. She exhibits no bony tenderness.       Back:  Shoulder ROM otherwise normal, it does not elicit pain. Right interscapular tenderness with palpation.  Lymphadenopathy:    She has no cervical adenopathy.  Neurological: She is alert and oriented to person, place, and time. She has normal strength.  Stable gait with no assistance.  Skin: Skin is warm. No erythema.  Psychiatric: She has a  normal mood and affect.  Well groomed, good eye contact.    ASSESSMENT AND PLAN:   Ms. Jane [name] was seen today for 5 months follow-up.   Ms. Jane [name] was seen today for follow-up.  Diagnoses and all orders for this visit:  Constipation, unspecified constipation type  She will try lower dose of Linzess, decreased from 145 mcg to 72 mcg. Side effects discussed.  -     linaclotide (LINZESS) 72 MCG capsule; Take 1 capsule (72 mcg total) by mouth daily before breakfast.  Hypertension with renal disease  Adequately controlled. No changes in current management.Instructed to skip Cozaar during dialysis days. DASH diet recommended. Eye exam recommended annually. F/U in 6 months, before if needed.  Class 2 severe obesity due to excess calories with serious comorbidity and body mass index (BMI) of 39.0 to 39.9 in adult [facility])  We discussed benefits of wt loss as well as adverse effects of obesity. Consistency with healthy diet and physical activity recommended. Any type of physical activity , upper extremity exercises or walking in place while  watching TV and for 10 min at the time recommended.   Gastroesophageal reflux disease, esophagitis presence not specified  GERD precautions discussed. Omeprazole 40 mg x 2-3 months then will try to decrease dose to 20 mg.  -     omeprazole (PRILOSEC) 40 MG capsule; Take 1 capsule (40 mg total) by mouth daily.  Upper back pain on right side  Topical Icy hot or similar medication. Local massage and shoulder's circular ROM exercises may help. Acetaminophen 500 mg tid as needed. F/U as needed.   -Ms. Jane [name] was advised to return sooner than planned today if new concerns arise.       Jamison Soward G. [name], MD  [facility]. [place] office.

## 2017-06-16 ENCOUNTER — Encounter: Payer: Self-pay | Admitting: Family Medicine

## 2017-06-16 ENCOUNTER — Ambulatory Visit (INDEPENDENT_AMBULATORY_CARE_PROVIDER_SITE_OTHER): Payer: Medicare Other | Admitting: Family Medicine

## 2017-06-16 VITALS — BP 132/77 | HR 72 | Temp 97.9°F | Resp 16 | Ht 65.0 in | Wt 246.2 lb

## 2017-06-16 DIAGNOSIS — M549 Dorsalgia, unspecified: Secondary | ICD-10-CM | POA: Diagnosis not present

## 2017-06-16 DIAGNOSIS — K5900 Constipation, unspecified: Secondary | ICD-10-CM | POA: Diagnosis not present

## 2017-06-16 DIAGNOSIS — Z6839 Body mass index (BMI) 39.0-39.9, adult: Secondary | ICD-10-CM | POA: Diagnosis not present

## 2017-06-16 DIAGNOSIS — K219 Gastro-esophageal reflux disease without esophagitis: Secondary | ICD-10-CM

## 2017-06-16 DIAGNOSIS — I129 Hypertensive chronic kidney disease with stage 1 through stage 4 chronic kidney disease, or unspecified chronic kidney disease: Secondary | ICD-10-CM | POA: Diagnosis not present

## 2017-06-16 MED ORDER — LINACLOTIDE 72 MCG PO CAPS: 72.0000 ug | ORAL_CAPSULE | Freq: Every day | ORAL | 2 refills | Status: DC

## 2017-06-16 MED ORDER — OMEPRAZOLE 40 MG PO CPDR: 40.0000 mg | DELAYED_RELEASE_CAPSULE | Freq: Every day | ORAL | 3 refills | Status: DC

## 2017-06-16 NOTE — Patient Instructions (Addendum)
A few things to remember from today's visit:   Hypertension with renal disease  Class 2 severe obesity due to excess calories with serious comorbidity and body mass index (BMI) of 39.0 to 39.9 in adult [facility])  Constipation, unspecified constipation type - Plan: linaclotide (LINZESS) 72 MCG capsule  Gastroesophageal reflux disease, esophagitis presence not specified - Plan: omeprazole (PRILOSEC) 40 MG capsule  Upper back pain on right side  Linzess decreased to 72 mcg.   Local ice,massage,and topical Asper cream, Icy hot patch,or similar medication. Skip Cozaar on days you have hemodialysis.  Please be sure medication list is accurate. If a new problem present, please set up appointment sooner than planned today.

## 2017-06-17 DIAGNOSIS — I4891 Unspecified atrial fibrillation: Secondary | ICD-10-CM | POA: Diagnosis not present

## 2017-06-17 DIAGNOSIS — D631 Anemia in chronic kidney disease: Secondary | ICD-10-CM | POA: Diagnosis not present

## 2017-06-17 DIAGNOSIS — N2581 Secondary hyperparathyroidism of renal origin: Secondary | ICD-10-CM | POA: Diagnosis not present

## 2017-06-17 DIAGNOSIS — N186 End stage renal disease: Secondary | ICD-10-CM | POA: Diagnosis not present

## 2017-06-19 DIAGNOSIS — I158 Other secondary hypertension: Secondary | ICD-10-CM | POA: Diagnosis not present

## 2017-06-19 DIAGNOSIS — N186 End stage renal disease: Secondary | ICD-10-CM | POA: Diagnosis not present

## 2017-06-19 DIAGNOSIS — N2581 Secondary hyperparathyroidism of renal origin: Secondary | ICD-10-CM | POA: Diagnosis not present

## 2017-06-19 DIAGNOSIS — D631 Anemia in chronic kidney disease: Secondary | ICD-10-CM | POA: Diagnosis not present

## 2017-06-19 DIAGNOSIS — Z992 Dependence on renal dialysis: Secondary | ICD-10-CM | POA: Diagnosis not present

## 2017-06-20 DIAGNOSIS — D509 Iron deficiency anemia, unspecified: Secondary | ICD-10-CM | POA: Diagnosis not present

## 2017-06-20 DIAGNOSIS — N2581 Secondary hyperparathyroidism of renal origin: Secondary | ICD-10-CM | POA: Diagnosis not present

## 2017-06-20 DIAGNOSIS — N186 End stage renal disease: Secondary | ICD-10-CM | POA: Diagnosis not present

## 2017-06-22 DIAGNOSIS — D509 Iron deficiency anemia, unspecified: Secondary | ICD-10-CM | POA: Diagnosis not present

## 2017-06-22 DIAGNOSIS — N2581 Secondary hyperparathyroidism of renal origin: Secondary | ICD-10-CM | POA: Diagnosis not present

## 2017-06-22 DIAGNOSIS — N186 End stage renal disease: Secondary | ICD-10-CM | POA: Diagnosis not present

## 2017-06-24 DIAGNOSIS — I4891 Unspecified atrial fibrillation: Secondary | ICD-10-CM | POA: Diagnosis not present

## 2017-06-24 DIAGNOSIS — N2581 Secondary hyperparathyroidism of renal origin: Secondary | ICD-10-CM | POA: Diagnosis not present

## 2017-06-24 DIAGNOSIS — N186 End stage renal disease: Secondary | ICD-10-CM | POA: Diagnosis not present

## 2017-06-24 DIAGNOSIS — D509 Iron deficiency anemia, unspecified: Secondary | ICD-10-CM | POA: Diagnosis not present

## 2017-06-26 DIAGNOSIS — N2581 Secondary hyperparathyroidism of renal origin: Secondary | ICD-10-CM | POA: Diagnosis not present

## 2017-06-26 DIAGNOSIS — N186 End stage renal disease: Secondary | ICD-10-CM | POA: Diagnosis not present

## 2017-06-26 DIAGNOSIS — D509 Iron deficiency anemia, unspecified: Secondary | ICD-10-CM | POA: Diagnosis not present

## 2017-06-29 DIAGNOSIS — D509 Iron deficiency anemia, unspecified: Secondary | ICD-10-CM | POA: Diagnosis not present

## 2017-06-29 DIAGNOSIS — N2581 Secondary hyperparathyroidism of renal origin: Secondary | ICD-10-CM | POA: Diagnosis not present

## 2017-06-29 DIAGNOSIS — N186 End stage renal disease: Secondary | ICD-10-CM | POA: Diagnosis not present

## 2017-07-01 DIAGNOSIS — D509 Iron deficiency anemia, unspecified: Secondary | ICD-10-CM | POA: Diagnosis not present

## 2017-07-01 DIAGNOSIS — I4891 Unspecified atrial fibrillation: Secondary | ICD-10-CM | POA: Diagnosis not present

## 2017-07-01 DIAGNOSIS — N186 End stage renal disease: Secondary | ICD-10-CM | POA: Diagnosis not present

## 2017-07-01 DIAGNOSIS — N2581 Secondary hyperparathyroidism of renal origin: Secondary | ICD-10-CM | POA: Diagnosis not present

## 2017-07-03 DIAGNOSIS — N186 End stage renal disease: Secondary | ICD-10-CM | POA: Diagnosis not present

## 2017-07-03 DIAGNOSIS — N2581 Secondary hyperparathyroidism of renal origin: Secondary | ICD-10-CM | POA: Diagnosis not present

## 2017-07-03 DIAGNOSIS — D509 Iron deficiency anemia, unspecified: Secondary | ICD-10-CM | POA: Diagnosis not present

## 2017-07-06 DIAGNOSIS — N186 End stage renal disease: Secondary | ICD-10-CM | POA: Diagnosis not present

## 2017-07-06 DIAGNOSIS — N2581 Secondary hyperparathyroidism of renal origin: Secondary | ICD-10-CM | POA: Diagnosis not present

## 2017-07-06 DIAGNOSIS — D509 Iron deficiency anemia, unspecified: Secondary | ICD-10-CM | POA: Diagnosis not present

## 2017-07-08 DIAGNOSIS — N186 End stage renal disease: Secondary | ICD-10-CM | POA: Diagnosis not present

## 2017-07-08 DIAGNOSIS — N2581 Secondary hyperparathyroidism of renal origin: Secondary | ICD-10-CM | POA: Diagnosis not present

## 2017-07-08 DIAGNOSIS — I4891 Unspecified atrial fibrillation: Secondary | ICD-10-CM | POA: Diagnosis not present

## 2017-07-08 DIAGNOSIS — D509 Iron deficiency anemia, unspecified: Secondary | ICD-10-CM | POA: Diagnosis not present

## 2017-07-10 DIAGNOSIS — N2581 Secondary hyperparathyroidism of renal origin: Secondary | ICD-10-CM | POA: Diagnosis not present

## 2017-07-10 DIAGNOSIS — D509 Iron deficiency anemia, unspecified: Secondary | ICD-10-CM | POA: Diagnosis not present

## 2017-07-10 DIAGNOSIS — N186 End stage renal disease: Secondary | ICD-10-CM | POA: Diagnosis not present

## 2017-07-12 DIAGNOSIS — D509 Iron deficiency anemia, unspecified: Secondary | ICD-10-CM | POA: Diagnosis not present

## 2017-07-12 DIAGNOSIS — N2581 Secondary hyperparathyroidism of renal origin: Secondary | ICD-10-CM | POA: Diagnosis not present

## 2017-07-12 DIAGNOSIS — N186 End stage renal disease: Secondary | ICD-10-CM | POA: Diagnosis not present

## 2017-07-15 DIAGNOSIS — D509 Iron deficiency anemia, unspecified: Secondary | ICD-10-CM | POA: Diagnosis not present

## 2017-07-15 DIAGNOSIS — N186 End stage renal disease: Secondary | ICD-10-CM | POA: Diagnosis not present

## 2017-07-15 DIAGNOSIS — I4891 Unspecified atrial fibrillation: Secondary | ICD-10-CM | POA: Diagnosis not present

## 2017-07-15 DIAGNOSIS — N2581 Secondary hyperparathyroidism of renal origin: Secondary | ICD-10-CM | POA: Diagnosis not present

## 2017-07-17 DIAGNOSIS — N186 End stage renal disease: Secondary | ICD-10-CM | POA: Diagnosis not present

## 2017-07-17 DIAGNOSIS — D509 Iron deficiency anemia, unspecified: Secondary | ICD-10-CM | POA: Diagnosis not present

## 2017-07-17 DIAGNOSIS — N2581 Secondary hyperparathyroidism of renal origin: Secondary | ICD-10-CM | POA: Diagnosis not present

## 2017-07-19 DIAGNOSIS — N186 End stage renal disease: Secondary | ICD-10-CM | POA: Diagnosis not present

## 2017-07-19 DIAGNOSIS — N2581 Secondary hyperparathyroidism of renal origin: Secondary | ICD-10-CM | POA: Diagnosis not present

## 2017-07-19 DIAGNOSIS — D509 Iron deficiency anemia, unspecified: Secondary | ICD-10-CM | POA: Diagnosis not present

## 2017-07-20 DIAGNOSIS — Z992 Dependence on renal dialysis: Secondary | ICD-10-CM | POA: Diagnosis not present

## 2017-07-20 DIAGNOSIS — N186 End stage renal disease: Secondary | ICD-10-CM | POA: Diagnosis not present

## 2017-07-20 DIAGNOSIS — I158 Other secondary hypertension: Secondary | ICD-10-CM | POA: Diagnosis not present

## 2017-07-22 DIAGNOSIS — N2581 Secondary hyperparathyroidism of renal origin: Secondary | ICD-10-CM | POA: Diagnosis not present

## 2017-07-22 DIAGNOSIS — I4891 Unspecified atrial fibrillation: Secondary | ICD-10-CM | POA: Diagnosis not present

## 2017-07-22 DIAGNOSIS — D631 Anemia in chronic kidney disease: Secondary | ICD-10-CM | POA: Diagnosis not present

## 2017-07-22 DIAGNOSIS — N186 End stage renal disease: Secondary | ICD-10-CM | POA: Diagnosis not present

## 2017-07-24 DIAGNOSIS — N2581 Secondary hyperparathyroidism of renal origin: Secondary | ICD-10-CM | POA: Diagnosis not present

## 2017-07-24 DIAGNOSIS — Z7901 Long term (current) use of anticoagulants: Secondary | ICD-10-CM | POA: Diagnosis not present

## 2017-07-24 DIAGNOSIS — N186 End stage renal disease: Secondary | ICD-10-CM | POA: Diagnosis not present

## 2017-07-24 DIAGNOSIS — D631 Anemia in chronic kidney disease: Secondary | ICD-10-CM | POA: Diagnosis not present

## 2017-07-27 DIAGNOSIS — N186 End stage renal disease: Secondary | ICD-10-CM | POA: Diagnosis not present

## 2017-07-27 DIAGNOSIS — D631 Anemia in chronic kidney disease: Secondary | ICD-10-CM | POA: Diagnosis not present

## 2017-07-27 DIAGNOSIS — N2581 Secondary hyperparathyroidism of renal origin: Secondary | ICD-10-CM | POA: Diagnosis not present

## 2017-07-28 ENCOUNTER — Other Ambulatory Visit: Payer: Self-pay | Admitting: *Deleted

## 2017-07-28 ENCOUNTER — Other Ambulatory Visit: Payer: Self-pay | Admitting: Family Medicine

## 2017-07-28 DIAGNOSIS — I480 Paroxysmal atrial fibrillation: Secondary | ICD-10-CM

## 2017-07-29 DIAGNOSIS — N186 End stage renal disease: Secondary | ICD-10-CM | POA: Diagnosis not present

## 2017-07-29 DIAGNOSIS — D631 Anemia in chronic kidney disease: Secondary | ICD-10-CM | POA: Diagnosis not present

## 2017-07-29 DIAGNOSIS — N2581 Secondary hyperparathyroidism of renal origin: Secondary | ICD-10-CM | POA: Diagnosis not present

## 2017-07-29 DIAGNOSIS — I4891 Unspecified atrial fibrillation: Secondary | ICD-10-CM | POA: Diagnosis not present

## 2017-07-31 DIAGNOSIS — D631 Anemia in chronic kidney disease: Secondary | ICD-10-CM | POA: Diagnosis not present

## 2017-07-31 DIAGNOSIS — N186 End stage renal disease: Secondary | ICD-10-CM | POA: Diagnosis not present

## 2017-07-31 DIAGNOSIS — N2581 Secondary hyperparathyroidism of renal origin: Secondary | ICD-10-CM | POA: Diagnosis not present

## 2017-08-03 DIAGNOSIS — I480 Paroxysmal atrial fibrillation: Secondary | ICD-10-CM | POA: Diagnosis not present

## 2017-08-03 DIAGNOSIS — I10 Essential (primary) hypertension: Secondary | ICD-10-CM | POA: Diagnosis not present

## 2017-08-03 DIAGNOSIS — Z992 Dependence on renal dialysis: Secondary | ICD-10-CM | POA: Diagnosis not present

## 2017-08-03 DIAGNOSIS — N2581 Secondary hyperparathyroidism of renal origin: Secondary | ICD-10-CM | POA: Diagnosis not present

## 2017-08-03 DIAGNOSIS — N186 End stage renal disease: Secondary | ICD-10-CM | POA: Diagnosis not present

## 2017-08-03 DIAGNOSIS — D631 Anemia in chronic kidney disease: Secondary | ICD-10-CM | POA: Diagnosis not present

## 2017-08-05 DIAGNOSIS — I4891 Unspecified atrial fibrillation: Secondary | ICD-10-CM | POA: Diagnosis not present

## 2017-08-05 DIAGNOSIS — N2581 Secondary hyperparathyroidism of renal origin: Secondary | ICD-10-CM | POA: Diagnosis not present

## 2017-08-05 DIAGNOSIS — D631 Anemia in chronic kidney disease: Secondary | ICD-10-CM | POA: Diagnosis not present

## 2017-08-05 DIAGNOSIS — N186 End stage renal disease: Secondary | ICD-10-CM | POA: Diagnosis not present

## 2017-08-07 DIAGNOSIS — N186 End stage renal disease: Secondary | ICD-10-CM | POA: Diagnosis not present

## 2017-08-07 DIAGNOSIS — D631 Anemia in chronic kidney disease: Secondary | ICD-10-CM | POA: Diagnosis not present

## 2017-08-07 DIAGNOSIS — N2581 Secondary hyperparathyroidism of renal origin: Secondary | ICD-10-CM | POA: Diagnosis not present

## 2017-08-10 DIAGNOSIS — D631 Anemia in chronic kidney disease: Secondary | ICD-10-CM | POA: Diagnosis not present

## 2017-08-10 DIAGNOSIS — N2581 Secondary hyperparathyroidism of renal origin: Secondary | ICD-10-CM | POA: Diagnosis not present

## 2017-08-10 DIAGNOSIS — N186 End stage renal disease: Secondary | ICD-10-CM | POA: Diagnosis not present

## 2017-08-12 DIAGNOSIS — E1129 Type 2 diabetes mellitus with other diabetic kidney complication: Secondary | ICD-10-CM | POA: Diagnosis not present

## 2017-08-12 DIAGNOSIS — D631 Anemia in chronic kidney disease: Secondary | ICD-10-CM | POA: Diagnosis not present

## 2017-08-12 DIAGNOSIS — N186 End stage renal disease: Secondary | ICD-10-CM | POA: Diagnosis not present

## 2017-08-12 DIAGNOSIS — N2581 Secondary hyperparathyroidism of renal origin: Secondary | ICD-10-CM | POA: Diagnosis not present

## 2017-08-14 DIAGNOSIS — D631 Anemia in chronic kidney disease: Secondary | ICD-10-CM | POA: Diagnosis not present

## 2017-08-14 DIAGNOSIS — N2581 Secondary hyperparathyroidism of renal origin: Secondary | ICD-10-CM | POA: Diagnosis not present

## 2017-08-14 DIAGNOSIS — N186 End stage renal disease: Secondary | ICD-10-CM | POA: Diagnosis not present

## 2017-08-17 DIAGNOSIS — N186 End stage renal disease: Secondary | ICD-10-CM | POA: Diagnosis not present

## 2017-08-17 DIAGNOSIS — D631 Anemia in chronic kidney disease: Secondary | ICD-10-CM | POA: Diagnosis not present

## 2017-08-17 DIAGNOSIS — N2581 Secondary hyperparathyroidism of renal origin: Secondary | ICD-10-CM | POA: Diagnosis not present

## 2017-08-19 DIAGNOSIS — N2581 Secondary hyperparathyroidism of renal origin: Secondary | ICD-10-CM | POA: Diagnosis not present

## 2017-08-19 DIAGNOSIS — D631 Anemia in chronic kidney disease: Secondary | ICD-10-CM | POA: Diagnosis not present

## 2017-08-19 DIAGNOSIS — N186 End stage renal disease: Secondary | ICD-10-CM | POA: Diagnosis not present

## 2017-08-20 DIAGNOSIS — N186 End stage renal disease: Secondary | ICD-10-CM | POA: Diagnosis not present

## 2017-08-20 DIAGNOSIS — Z992 Dependence on renal dialysis: Secondary | ICD-10-CM | POA: Diagnosis not present

## 2017-08-20 DIAGNOSIS — I158 Other secondary hypertension: Secondary | ICD-10-CM | POA: Diagnosis not present

## 2017-08-21 DIAGNOSIS — D631 Anemia in chronic kidney disease: Secondary | ICD-10-CM | POA: Diagnosis not present

## 2017-08-21 DIAGNOSIS — Z992 Dependence on renal dialysis: Secondary | ICD-10-CM | POA: Diagnosis not present

## 2017-08-21 DIAGNOSIS — Z7901 Long term (current) use of anticoagulants: Secondary | ICD-10-CM | POA: Diagnosis not present

## 2017-08-21 DIAGNOSIS — N186 End stage renal disease: Secondary | ICD-10-CM | POA: Diagnosis not present

## 2017-08-21 DIAGNOSIS — I158 Other secondary hypertension: Secondary | ICD-10-CM | POA: Diagnosis not present

## 2017-08-21 DIAGNOSIS — N2581 Secondary hyperparathyroidism of renal origin: Secondary | ICD-10-CM | POA: Diagnosis not present

## 2017-08-24 DIAGNOSIS — N2581 Secondary hyperparathyroidism of renal origin: Secondary | ICD-10-CM | POA: Diagnosis not present

## 2017-08-24 DIAGNOSIS — D631 Anemia in chronic kidney disease: Secondary | ICD-10-CM | POA: Diagnosis not present

## 2017-08-24 DIAGNOSIS — N186 End stage renal disease: Secondary | ICD-10-CM | POA: Diagnosis not present

## 2017-08-26 DIAGNOSIS — D631 Anemia in chronic kidney disease: Secondary | ICD-10-CM | POA: Diagnosis not present

## 2017-08-26 DIAGNOSIS — N2581 Secondary hyperparathyroidism of renal origin: Secondary | ICD-10-CM | POA: Diagnosis not present

## 2017-08-26 DIAGNOSIS — N186 End stage renal disease: Secondary | ICD-10-CM | POA: Diagnosis not present

## 2017-08-28 DIAGNOSIS — N2581 Secondary hyperparathyroidism of renal origin: Secondary | ICD-10-CM | POA: Diagnosis not present

## 2017-08-28 DIAGNOSIS — D631 Anemia in chronic kidney disease: Secondary | ICD-10-CM | POA: Diagnosis not present

## 2017-08-28 DIAGNOSIS — N186 End stage renal disease: Secondary | ICD-10-CM | POA: Diagnosis not present

## 2017-08-31 ENCOUNTER — Ambulatory Visit (INDEPENDENT_AMBULATORY_CARE_PROVIDER_SITE_OTHER): Payer: Medicare Other | Admitting: Family Medicine

## 2017-08-31 ENCOUNTER — Encounter: Payer: Self-pay | Admitting: Family Medicine

## 2017-08-31 VITALS — BP 130/78 | HR 60 | Temp 98.8°F | Resp 16 | Ht 65.0 in | Wt 245.2 lb

## 2017-08-31 DIAGNOSIS — H6122 Impacted cerumen, left ear: Secondary | ICD-10-CM | POA: Diagnosis not present

## 2017-08-31 DIAGNOSIS — N186 End stage renal disease: Secondary | ICD-10-CM | POA: Diagnosis not present

## 2017-08-31 DIAGNOSIS — N2581 Secondary hyperparathyroidism of renal origin: Secondary | ICD-10-CM | POA: Diagnosis not present

## 2017-08-31 DIAGNOSIS — M542 Cervicalgia: Secondary | ICD-10-CM | POA: Diagnosis not present

## 2017-08-31 DIAGNOSIS — D631 Anemia in chronic kidney disease: Secondary | ICD-10-CM | POA: Diagnosis not present

## 2017-08-31 NOTE — Patient Instructions (Signed)
A few things to remember from today's visit:   Cervicalgia  Impacted cerumen of left ear  Hearing loss of left ear due to cerumen impaction   Icy Hot or Asper cream on neck,massage,and Acetaminophen 500 mg 3 times per day as needed.    Earwax Buildup, Adult The ears produce a substance called earwax that helps keep bacteria out of the ear and protects the skin in the ear canal. Occasionally, earwax can build up in the ear and cause discomfort or hearing loss. What increases the risk? This condition is more likely to develop in people who:  Are female.  Are elderly.  Naturally produce more earwax.  Clean their ears often with cotton swabs.  Use earplugs often.  Use in-ear headphones often.  Wear hearing aids.  Have narrow ear canals.  Have earwax that is overly thick or sticky.  Have eczema.  Are dehydrated.  Have excess hair in the ear canal.  What are the signs or symptoms? Symptoms of this condition include:  Reduced or muffled hearing.  A feeling of fullness in the ear or feeling that the ear is plugged.  Fluid coming from the ear.  Ear pain.  Ear itch.  Ringing in the ear.  Coughing.  An obvious piece of earwax that can be seen inside the ear canal.  How is this diagnosed? This condition may be diagnosed based on:  Your symptoms.  Your medical history.  An ear exam. During the exam, your health care provider will look into your ear with an instrument called an otoscope.  You may have tests, including a hearing test. How is this treated? This condition may be treated by:  Using ear drops to soften the earwax.  Having the earwax removed by a health care provider. The health care provider may: ? Flush the ear with water. ? Use an instrument that has a loop on the end (curette). ? Use a suction device.  Surgery to remove the wax buildup. This may be done in severe cases.  Follow these instructions at home:  Take over-the-counter and  prescription medicines only as told by your health care provider.  Do not put any objects, including cotton swabs, into your ear. You can clean the opening of your ear canal with a washcloth or facial tissue.  Follow instructions from your health care provider about cleaning your ears. Do not over-clean your ears.  Drink enough fluid to keep your urine clear or pale yellow. This will help to thin the earwax.  Keep all follow-up visits as told by your health care provider. If earwax builds up in your ears often or if you use hearing aids, consider seeing your health care provider for routine, preventive ear cleanings. Ask your health care provider how often you should schedule your cleanings.  If you have hearing aids, clean them according to instructions from the manufacturer and your health care provider. Contact a health care provider if:  You have ear pain.  You develop a fever.  You have blood, pus, or other fluid coming from your ear.  You have hearing loss.  You have ringing in your ears that does not go away.  Your symptoms do not improve with treatment.  You feel like the room is spinning (vertigo). Summary  Earwax can build up in the ear and cause discomfort or hearing loss.  The most common symptoms of this condition include reduced or muffled hearing and a feeling of fullness in the ear or feeling that  the ear is plugged.  This condition may be diagnosed based on your symptoms, your medical history, and an ear exam.  This condition may be treated by using ear drops to soften the earwax or by having the earwax removed by a health care provider.  Do not put any objects, including cotton swabs, into your ear. You can clean the opening of your ear canal with a washcloth or facial tissue. This information is not intended to replace advice given to you by your health care provider. Make sure you discuss any questions you have with your health care provider. Document  Released: 08/14/2004 Document Revised: 09/17/2016 Document Reviewed: 09/17/2016 Elsevier Interactive Patient Education  2018 [employer].   Please be sure medication list is accurate. If a new problem present, please set up appointment sooner than planned today.

## 2017-08-31 NOTE — Progress Notes (Signed)
ACUTE VISIT  HPI:  Chief Complaint  Patient presents with  . Left ear pain    left ear paiin that radaites to back of neck on left side, started Saturday after washing hair, water got in ear, unable to hear    [name] is a 72 y.o.female here today complaining of 2 days of left earache after she got water in her ear when she was watching her hair. She tried to clean ear with a q tip,no Hx of trauma,next day (yesterday) she started with ear ache and hearing loss. She has not noted fever,chills,or body aches. No associated sore throat, nasal congestion, or cough.  She has not used OTC medication.   Otalgia   There is pain in the left ear. This is a new problem. The current episode started yesterday. The problem occurs constantly. There has been no fever. The pain is moderate. Associated symptoms include headaches, hearing loss and neck pain. Pertinent negatives include no abdominal pain, coughing, rash, rhinorrhea, sore throat or vomiting. She has tried nothing for the symptoms.     She is also c/o left-sided neck pain, that goes from left trapezium,cervical,retro auricular,and left frontal. Achy pain,moderate, and intermittent pain. Exacerbated by movement and palpation,alliviated by rest. She has not taken OTC medication.  She states that she has had this pain intermittently for a while. No Hx of trauma,pain is not radiated to UE, and no associated numbness or tingling.   Symptoms otherwise stable.     Review of Systems  Constitutional: Negative for activity change, appetite change, chills and fever.  HENT: Positive for ear pain and hearing loss. Negative for facial swelling, rhinorrhea and sore throat.   Respiratory: Negative for cough, shortness of breath and wheezing.   Cardiovascular: Negative for leg swelling.  Gastrointestinal: Negative for abdominal pain, nausea and vomiting.  Musculoskeletal: Positive for neck pain. Negative for gait problem.  Skin:  Negative for rash.  Neurological: Positive for headaches. Negative for syncope, weakness and numbness.  Hematological: Negative for adenopathy. Does not bruise/bleed easily.  Psychiatric/Behavioral: Negative for confusion. The patient is nervous/anxious.       Current Outpatient Medications on File Prior to Visit  Medication Sig Dispense Refill  . albuterol (PROVENTIL HFA;VENTOLIN HFA) 108 (90 Base) MCG/ACT inhaler Inhale 1 puff into the lungs every 6 (six) hours as needed for wheezing or shortness of breath.    . Amino Acids-Protein Hydrolys (FEEDING SUPPLEMENT, PRO-STAT SUGAR FREE 64,) LIQD Take 30 mLs by mouth 2 (two) times daily. 900 mL 0  . amiodarone (PACERONE) 200 MG tablet TAKE 1 TABLET BY MOUTH ONCE DAILY 90 tablet 1  . carvedilol (COREG) 25 MG tablet Take 2 tablets (50 mg total) by mouth 2 (two) times daily with a meal. 360 tablet 2  . cinacalcet (SENSIPAR) 30 MG tablet Take 30 mg by mouth daily.    Marland Kitchen linaclotide (LINZESS) 72 MCG capsule Take 1 capsule (72 mcg total) by mouth daily before breakfast. 30 capsule 2  . losartan (COZAAR) 100 MG tablet Take 1 tablet (100 mg total) by mouth daily. 90 tablet 2  . multivitamin (RENA-VIT) TABS tablet Take 1 tablet by mouth daily.    Marland Kitchen omeprazole (PRILOSEC) 40 MG capsule Take 1 capsule (40 mg total) by mouth daily. 30 capsule 3  . polyethylene glycol powder (GLYCOLAX/MIRALAX) powder Take 17 g by mouth every other day. [place]    . Probiotic Product (PROBIOTIC PO) Take 1 tablet by mouth daily.    Marland Kitchen  sevelamer carbonate (RENVELA) 800 MG tablet Take 800-1,600 mg by mouth See admin instructions. 1,600 mg after meals TWO times a day and 800 mg after each snack    . warfarin (COUMADIN) 5 MG tablet AS DIRECTED 50 tablet 3  . oxyCODONE-acetaminophen (PERCOCET/ROXICET) 5-325 MG tablet Take 1-2 tablets by mouth every 6 (six) hours as needed for severe pain. (Patient not taking: Reported on 08/31/2017) 15 tablet 0   No current facility-administered  medications on file prior to visit.      Past Medical History:  Diagnosis Date  . Acid reflux   . Atrial fibrillation ([place])   . Hypertension   . Renal disorder   . Sleep apnea    Allergies  Allergen Reactions  . Penicillins Rash  . Sulfa Antibiotics Rash  . Tape Other (See Comments)    PLASTIC TAPE TEARS OFF THE SKIN AND BRUISES IT TERRIBLY!! GETS ITCHY AND RASH FROM PAPER AND PLASTIC TAPES  . Latex Rash    Social History   Socioeconomic History  . Marital status: Single    Spouse name: None  . Number of children: None  . Years of education: None  . Highest education level: None  Social Needs  . Financial resource strain: None  . Food insecurity - worry: None  . Food insecurity - inability: None  . Transportation needs - medical: None  . Transportation needs - non-medical: None  Occupational History  . None  Tobacco Use  . Smoking status: Never Smoker  . Smokeless tobacco: Never Used  Substance and Sexual Activity  . Alcohol use: No  . Drug use: No  . Sexual activity: No  Other Topics Concern  . None  Social History Narrative  . None    Vitals:   08/31/17 1557  BP: 130/78  Pulse: 60  Resp: 16  Temp: 98.8 F (37.1 C)  SpO2: 95%   Body mass index is 40.81 kg/m.    Physical Exam  Nursing note and vitals reviewed. Constitutional: She is oriented to person, place, and time. She appears well-developed. She does not appear ill. No distress.  HENT:  Head: Normocephalic and atraumatic.  Right Ear: Tympanic membrane and external ear normal.  Left Ear: External ear normal. No mastoid tenderness.  Mouth/Throat: Oropharynx is clear and moist and mucous membranes are normal.  Right TM seen partially due to cerumen excess. Not able to see left TM due to cerumen excess. No tenderness upon palpation of tragus or upon pulling ear.   Eyes: Conjunctivae are normal.  Neck: No edema and no erythema present.  Cardiovascular: Normal rate and regular rhythm.    Respiratory: Effort normal and breath sounds normal. No respiratory distress.  Musculoskeletal:  Pain upon palpation left trapezium and cervical paraspinal muscles. Mild limitation of rotation. Pain elicited with movement.  Lymphadenopathy:    She has no cervical adenopathy.  Neurological: She is alert and oriented to person, place, and time. She has normal strength. Gait normal.  Skin: Skin is warm. No rash noted. No erythema.  Psychiatric: She has a normal mood and affect.  Well groomed, good eye contact.     ASSESSMENT AND PLAN:  Ms. Jane [name] was seen today for left ear pain.  Diagnoses and all orders for this visit:  Cervicalgia  Since there is no Hx of trauma, I do not think imaging is needed. Muscular pain. Local massage and OTC topical Icy Hot or Asper cream may also help. ROM exercises. Instructed about warning signs. F/U as needed.  Impacted cerumen of left ear  Avoid Q tip use. After verbal consent she had ear lavage,which she tolerated well with no complications.  Hearing loss of left ear due to cerumen impaction  Hearing loss resolved after ear lavage. F/U as needed.     -Ms. Jane [name] was advised to seek attention immediately if symptoms worsen or to follow if they persist or new concerns arise.       [name]. [name], MD  [facility]. [place] office.

## 2017-09-02 DIAGNOSIS — D631 Anemia in chronic kidney disease: Secondary | ICD-10-CM | POA: Diagnosis not present

## 2017-09-02 DIAGNOSIS — N186 End stage renal disease: Secondary | ICD-10-CM | POA: Diagnosis not present

## 2017-09-02 DIAGNOSIS — N2581 Secondary hyperparathyroidism of renal origin: Secondary | ICD-10-CM | POA: Diagnosis not present

## 2017-09-04 DIAGNOSIS — N186 End stage renal disease: Secondary | ICD-10-CM | POA: Diagnosis not present

## 2017-09-04 DIAGNOSIS — D631 Anemia in chronic kidney disease: Secondary | ICD-10-CM | POA: Diagnosis not present

## 2017-09-04 DIAGNOSIS — N2581 Secondary hyperparathyroidism of renal origin: Secondary | ICD-10-CM | POA: Diagnosis not present

## 2017-09-07 DIAGNOSIS — N2581 Secondary hyperparathyroidism of renal origin: Secondary | ICD-10-CM | POA: Diagnosis not present

## 2017-09-07 DIAGNOSIS — N186 End stage renal disease: Secondary | ICD-10-CM | POA: Diagnosis not present

## 2017-09-07 DIAGNOSIS — D631 Anemia in chronic kidney disease: Secondary | ICD-10-CM | POA: Diagnosis not present

## 2017-09-09 DIAGNOSIS — N2581 Secondary hyperparathyroidism of renal origin: Secondary | ICD-10-CM | POA: Diagnosis not present

## 2017-09-09 DIAGNOSIS — D631 Anemia in chronic kidney disease: Secondary | ICD-10-CM | POA: Diagnosis not present

## 2017-09-09 DIAGNOSIS — N186 End stage renal disease: Secondary | ICD-10-CM | POA: Diagnosis not present

## 2017-09-11 ENCOUNTER — Ambulatory Visit (INDEPENDENT_AMBULATORY_CARE_PROVIDER_SITE_OTHER): Payer: Medicare Other | Admitting: Family

## 2017-09-11 ENCOUNTER — Ambulatory Visit (INDEPENDENT_AMBULATORY_CARE_PROVIDER_SITE_OTHER)
Admission: RE | Admit: 2017-09-11 | Discharge: 2017-09-11 | Disposition: A | Discharge status: Home / Self Care | Payer: Medicare Other | Source: Ambulatory Visit | Attending: Family | Admitting: Family

## 2017-09-11 ENCOUNTER — Ambulatory Visit
Admission: RE | Admit: 2017-09-11 | Discharge: 2017-09-11 | Disposition: A | Discharge status: Home / Self Care | Payer: Medicare Other | Source: Ambulatory Visit | Attending: Family | Admitting: Family

## 2017-09-11 ENCOUNTER — Ambulatory Visit: Payer: Self-pay | Admitting: *Deleted

## 2017-09-11 ENCOUNTER — Encounter: Payer: Self-pay | Admitting: Family

## 2017-09-11 VITALS — BP 136/80 | HR 62 | Temp 98.6°F | Ht 65.0 in | Wt 249.0 lb

## 2017-09-11 DIAGNOSIS — N186 End stage renal disease: Secondary | ICD-10-CM | POA: Diagnosis not present

## 2017-09-11 DIAGNOSIS — M542 Cervicalgia: Secondary | ICD-10-CM | POA: Diagnosis not present

## 2017-09-11 DIAGNOSIS — N2581 Secondary hyperparathyroidism of renal origin: Secondary | ICD-10-CM | POA: Diagnosis not present

## 2017-09-11 DIAGNOSIS — D631 Anemia in chronic kidney disease: Secondary | ICD-10-CM | POA: Diagnosis not present

## 2017-09-11 DIAGNOSIS — Z7901 Long term (current) use of anticoagulants: Secondary | ICD-10-CM | POA: Diagnosis not present

## 2017-09-11 DIAGNOSIS — M47812 Spondylosis without myelopathy or radiculopathy, cervical region: Secondary | ICD-10-CM | POA: Diagnosis not present

## 2017-09-11 MED ORDER — METHOCARBAMOL 500 MG PO TABS: 500.0000 mg | ORAL_TABLET | Freq: Three times a day (TID) | ORAL | 0 refills | Status: DC | PRN

## 2017-09-11 NOTE — Patient Instructions (Signed)
Call [facility] and ask to schedule with Dr. Ileene Rubens for follow-up;

## 2017-09-11 NOTE — Telephone Encounter (Signed)
Patient is having pain in neck for 1 1/2 weeks- she states she was seen in office for back pain and was told to treat it with ice/massage. The pain in neck is new- and not associated with the back. Neck hurts on both sides all the way up to her ears and she can't twist/turn her neck. Constant pain that is throbbing. Patient is reporting that her BP is elevated due to pain- 180/70- after dialysis today. Patient advised she needs to be seen in more quick manner- no appointment available in PCP office today- patient does not want to go to UC/ED today- will call flow coordinator and see if they are ok with patient seeing another office. Advised they would like for patient to be seen today- appointment made at [facility].  Reason for Disposition . [1] SEVERE neck pain (e.g., excruciating, unable to do any normal activities) AND [2] not improved after 2 hours of pain medicine  Answer Assessment - Initial Assessment Questions 1. ONSET: "When did the pain begin?"      1 1/2 week 2. LOCATION: "Where does it hurt?"      Bilateral pain in muscles of  neck 3. PATTERN "Does the pain come and go, or has it been constant since it started?"      Constant since started- can't turn head or look up and down 4. SEVERITY: "How bad is the pain?"  (Scale 1-10; or mild, moderate, severe)   - MILD (1-3): doesn't interfere with normal activities    - MODERATE (4-7): interferes with normal activities or awakens from sleep    - SEVERE (8-10):  excruciating pain, unable to do any normal activities      9- patient states she has never had pain like this 5. RADIATION: "Does the pain go anywhere else, shoot into your arms?"     Into face- eye 6. CORD SYMPTOMS: "Any weakness or numbness of the arms or legs?"     no 7. CAUSE: "What do you think is causing the neck pain?"     unsure 8. NECK OVERUSE: "Any recent activities that involved turning or twisting the neck?"     no 9. OTHER SYMPTOMS: "Do you have any other symptoms?"  (e.g., headache, fever, chest pain, difficulty breathing, neck swelling)     no 10. PREGNANCY: "Is there any chance you are pregnant?" "When was your last menstrual period?"       n/a  Protocols used: NECK PAIN OR STIFFNESS-A-AH

## 2017-09-11 NOTE — Progress Notes (Signed)
Jane [name] is a 72 y.o. female with the following history as recorded in EpicCare:  Patient Active Problem List   Diagnosis Date Noted  . GERD (gastroesophageal reflux disease) 06/16/2017  . Constipation 10/16/2016  . Obesity with serious comorbidity 10/16/2016  . Diverticulitis of large intestine with perforation 06/26/2016  . Anemia in other chronic diseases classified elsewhere 06/25/2016  . Chest pain with moderate risk for cardiac etiology 06/25/2016  . ESRD on hemodialysis ([place]) 06/30/2012  . Paroxysmal atrial fibrillation ([place]) 06/30/2012  . Hypertension with renal disease 06/30/2012    Current Outpatient Medications  Medication Sig Dispense Refill  . albuterol (PROVENTIL HFA;VENTOLIN HFA) 108 (90 Base) MCG/ACT inhaler Inhale 1 puff into the lungs every 6 (six) hours as needed for wheezing or shortness of breath.    . Amino Acids-Protein Hydrolys (FEEDING SUPPLEMENT, PRO-STAT SUGAR FREE 64,) LIQD Take 30 mLs by mouth 2 (two) times daily. 900 mL 0  . amiodarone (PACERONE) 200 MG tablet TAKE 1 TABLET BY MOUTH ONCE DAILY 90 tablet 1  . calcium carbonate (TUMS - DOSED IN MG ELEMENTAL CALCIUM) 500 MG chewable tablet Chew 2 tablets by mouth 3 (three) times daily with meals.    . carvedilol (COREG) 25 MG tablet Take 2 tablets (50 mg total) by mouth 2 (two) times daily with a meal. 360 tablet 2  . cinacalcet (SENSIPAR) 30 MG tablet Take 30 mg by mouth daily.    Marland Kitchen linaclotide (LINZESS) 72 MCG capsule Take 1 capsule (72 mcg total) by mouth daily before breakfast. 30 capsule 2  . losartan (COZAAR) 100 MG tablet Take 1 tablet (100 mg total) by mouth daily. 90 tablet 2  . multivitamin (RENA-VIT) TABS tablet Take 1 tablet by mouth daily.    Marland Kitchen omeprazole (PRILOSEC) 40 MG capsule Take 1 capsule (40 mg total) by mouth daily. 30 capsule 3  . sevelamer carbonate (RENVELA) 800 MG tablet Take 800-1,600 mg by mouth See admin instructions. 1,600 mg after meals TWO times a day and 800 mg after each snack     . warfarin (COUMADIN) 5 MG tablet AS DIRECTED 50 tablet 3  . methocarbamol (ROBAXIN) 500 MG tablet Take 1 tablet (500 mg total) by mouth every 8 (eight) hours as needed for muscle spasms. 20 tablet 0   No current facility-administered medications for this visit.     Allergies: Penicillins; Sulfa antibiotics; Tape; and Latex  Past Medical History:  Diagnosis Date  . Acid reflux   . Atrial fibrillation ([place])   . Hypertension   . Renal disorder   . Sleep apnea     Past Surgical History:  Procedure Laterality Date  . IR GENERIC HISTORICAL  07/09/2016   IR US GUIDE VASC ACCESS RIGHT 07/09/2016 Arne Cleveland, MD [facility]INTER[facility]  . IR GENERIC HISTORICAL  07/09/2016   IR FLUORO GUIDE CV LINE RIGHT 07/09/2016 Arne Cleveland, MD [facility]  . KNEE ARTHROPLASTY    . LAPAROSCOPIC SIGMOID COLECTOMY N/A 07/11/2016   Procedure: LAPAROSCOPIC SIGMOID COLECTOMY;  Surgeon: Clovis Riley, MD;  Location: [facility];  Service: General;  Laterality: N/A;  . TUBAL LIGATION      Family History  Problem Relation Age of Onset  . Heart failure Mother   . Stroke Mother   . Other Father     Social History   Tobacco Use  . Smoking status: Never Smoker  . Smokeless tobacco: Never Used  Substance Use Topics  . Alcohol use: No    Subjective:  Patient was seen  by her PCP on 08/31/2017 with sudden onset of neck pain; was thought to be muscular and told to use ICY HOT and rest; notes that cannot move her neck completely due to the pain; denies any numbness/ tingling radiating into her upper arms; no known injury or trauma to the neck- "just woke up and the pain was there." Has history of A. Fib and is dialysis patient- cannot take any type of NSAID;   Objective:  Vitals:   09/11/17 1542  BP: 136/80  Pulse: 62  Temp: 98.6 F (37 C)  TempSrc: Oral  SpO2: 97%  Weight: 249 lb 0.6 oz (113 kg)  Height: 5\' 5"  (1.651 m)    General: Well developed, well nourished, in no acute distress  Skin : Warm and dry.   Head: Normocephalic and atraumatic  Eyes: Sclera and conjunctiva clear; pupils round and reactive to light; extraocular movements intact  Lungs: Respirations unlabored; clear to auscultation bilaterally without wheeze, rales, rhonchi  Musculoskeletal: No deformities; no active joint inflammation; LROM in neck on active and passive motion Extremities: No edema, cyanosis, clubbing  Vessels: Symmetric bilaterally  Neurologic: Alert and oriented; speech intact; face symmetrical; moves all extremities well; CNII-XII intact without focal deficit  Assessment:  1. Neck pain     Plan:  Agree most likely muscular; patient cannot use NSAIDs; trial of Robaxin 500 mg tid prn; will update X-ray; encouraged to follow-up with sports medicine or ortho; she prefers to try and see sports medicine at [facility] and will schedule her own appointment on Monday; follow-up with her PCP for continued problems or questions;  No Follow-up on file.  Orders Placed This Encounter  Procedures  . DG Neck Soft Tissue    Standing Status:   Future    Number of Occurrences:   1    Standing Expiration Date:   11/10/2018    Order Specific Question:   Reason for Exam (SYMPTOM  OR DIAGNOSIS REQUIRED)    Answer:   neck pain    Order Specific Question:   Preferred imaging location?    Answer:   [facility]    Order Specific Question:   Radiology Contrast Protocol - do NOT remove file path    Answer:   [email or web address]  . DG Cervical Spine 2 or 3 views    Standing Status:   Future    Number of Occurrences:   1    Standing Expiration Date:   11/10/2018    Order Specific Question:   Reason for Exam (SYMPTOM  OR DIAGNOSIS REQUIRED)    Answer:   neck pain    Order Specific Question:   Preferred imaging location?    Answer:   [facility]    Order Specific Question:   Radiology Contrast Protocol - do NOT remove file path    Answer:    [email or web address]    Requested Prescriptions   Signed Prescriptions Disp Refills  . methocarbamol (ROBAXIN) 500 MG tablet 20 tablet 0    Sig: Take 1 tablet (500 mg total) by mouth every 8 (eight) hours as needed for muscle spasms.

## 2017-09-14 ENCOUNTER — Encounter: Payer: Self-pay | Admitting: Family Medicine

## 2017-09-14 ENCOUNTER — Ambulatory Visit: Payer: Medicare Other | Admitting: Family Medicine

## 2017-09-14 ENCOUNTER — Ambulatory Visit (INDEPENDENT_AMBULATORY_CARE_PROVIDER_SITE_OTHER): Payer: Medicare Other | Admitting: Family Medicine

## 2017-09-14 VITALS — BP 140/82 | HR 72 | Temp 98.1°F | Resp 12 | Ht 65.0 in | Wt 249.0 lb

## 2017-09-14 DIAGNOSIS — R519 Headache, unspecified: Secondary | ICD-10-CM

## 2017-09-14 DIAGNOSIS — M542 Cervicalgia: Secondary | ICD-10-CM

## 2017-09-14 DIAGNOSIS — D631 Anemia in chronic kidney disease: Secondary | ICD-10-CM | POA: Diagnosis not present

## 2017-09-14 DIAGNOSIS — N186 End stage renal disease: Secondary | ICD-10-CM | POA: Diagnosis not present

## 2017-09-14 DIAGNOSIS — R51 Headache: Secondary | ICD-10-CM | POA: Diagnosis not present

## 2017-09-14 DIAGNOSIS — N2581 Secondary hyperparathyroidism of renal origin: Secondary | ICD-10-CM | POA: Diagnosis not present

## 2017-09-14 DIAGNOSIS — M549 Dorsalgia, unspecified: Secondary | ICD-10-CM

## 2017-09-14 MED ORDER — TIZANIDINE HCL 2 MG PO CAPS: 2.0000 mg | ORAL_CAPSULE | Freq: Two times a day (BID) | ORAL | 0 refills | Status: DC | PRN

## 2017-09-14 MED ORDER — TRAMADOL HCL 50 MG PO TABS: 25.0000 mg | ORAL_TABLET | Freq: Every evening | ORAL | 0 refills | Status: AC | PRN

## 2017-09-14 NOTE — Progress Notes (Signed)
ACUTE VISIT   HPI:  Chief Complaint  Patient presents with  . Bilateral Shoulder Pain    went to [facility] on Saturday, had X-rays done. Feels like knots in back of neck, unable to sit up straight. Had muscle relaxer at 8:30 am    Jane [name] is a 72 y.o. female, who is here today complaining of bilateral shoulder and neck pain. She has Hx of atrial fib and ESRD on hemodialysis among some other chronic medical problems.   She has had pain since 08/28/17.   Neck pain is causing occipital headache,pulling like sensation.No associated nausea,vomiting,or visual changes.   She was seen on 09/11/17, Methocarbamol recommended. According to pt,she had cervical X ray done and has not heart about results.  Pain is exacerbated by movement and cough.  Cervical pain is "bad",constant,10/10. Tightness sensation on upper back and shoulders. Pain is exacerbated by shoulders movement, alleviated by rest. No significant limitation of ROM.  Pain is not radiated to upper extremities. She has not noted numbness,tingling,or weakness of UE's.  Took Methocarbamol this morning but does not seem to help.  Alleviated by massage.  She has also used OTC Icy hot   Pain is getting worse.  No Hx of trauma.    Review of Systems  Constitutional: Negative for appetite change, fatigue and fever.  HENT: Negative for mouth sores, sore throat, trouble swallowing and voice change.   Respiratory: Negative for cough, shortness of breath and wheezing.   Cardiovascular: Negative for chest pain, palpitations and leg swelling.  Gastrointestinal: Negative for nausea and vomiting.  Musculoskeletal: Positive for arthralgias, back pain and neck pain. Negative for joint swelling.  Skin: Negative for rash.  Neurological: Positive for headaches. Negative for syncope, weakness and numbness.  Psychiatric/Behavioral: Negative for confusion. The patient is nervous/anxious.       Current Outpatient Medications on  File Prior to Visit  Medication Sig Dispense Refill  . albuterol (PROVENTIL HFA;VENTOLIN HFA) 108 (90 Base) MCG/ACT inhaler Inhale 1 puff into the lungs every 6 (six) hours as needed for wheezing or shortness of breath.    . Amino Acids-Protein Hydrolys (FEEDING SUPPLEMENT, PRO-STAT SUGAR FREE 64,) LIQD Take 30 mLs by mouth 2 (two) times daily. 900 mL 0  . amiodarone (PACERONE) 200 MG tablet TAKE 1 TABLET BY MOUTH ONCE DAILY 90 tablet 1  . calcium carbonate (TUMS - DOSED IN MG ELEMENTAL CALCIUM) 500 MG chewable tablet Chew 2 tablets by mouth 3 (three) times daily with meals.    . carvedilol (COREG) 25 MG tablet Take 2 tablets (50 mg total) by mouth 2 (two) times daily with a meal. 360 tablet 2  . cinacalcet (SENSIPAR) 30 MG tablet Take 30 mg by mouth daily.    Marland Kitchen linaclotide (LINZESS) 72 MCG capsule Take 1 capsule (72 mcg total) by mouth daily before breakfast. 30 capsule 2  . losartan (COZAAR) 100 MG tablet Take 1 tablet (100 mg total) by mouth daily. 90 tablet 2  . multivitamin (RENA-VIT) TABS tablet Take 1 tablet by mouth daily.    Marland Kitchen omeprazole (PRILOSEC) 40 MG capsule Take 1 capsule (40 mg total) by mouth daily. 30 capsule 3  . sevelamer carbonate (RENVELA) 800 MG tablet Take 800-1,600 mg by mouth See admin instructions. 1,600 mg after meals TWO times a day and 800 mg after each snack    . warfarin (COUMADIN) 5 MG tablet AS DIRECTED 50 tablet 3   No current facility-administered medications on file prior to visit.  Past Medical History:  Diagnosis Date  . Acid reflux   . Atrial fibrillation ([place])   . Hypertension   . Renal disorder   . Sleep apnea    Allergies  Allergen Reactions  . Penicillins Rash  . Sulfa Antibiotics Rash  . Tape Other (See Comments)    PLASTIC TAPE TEARS OFF THE SKIN AND BRUISES IT TERRIBLY!! GETS ITCHY AND RASH FROM PAPER AND PLASTIC TAPES  . Latex Rash    Social History   Socioeconomic History  . Marital status: Single    Spouse name: None  .  Number of children: None  . Years of education: None  . Highest education level: None  Social Needs  . Financial resource strain: None  . Food insecurity - worry: None  . Food insecurity - inability: None  . Transportation needs - medical: None  . Transportation needs - non-medical: None  Occupational History  . None  Tobacco Use  . Smoking status: Never Smoker  . Smokeless tobacco: Never Used  Substance and Sexual Activity  . Alcohol use: No  . Drug use: No  . Sexual activity: No  Other Topics Concern  . None  Social History Narrative  . None    Vitals:   09/14/17 1414  BP: 140/82  Pulse: 72  Resp: 12  Temp: 98.1 F (36.7 C)  SpO2: 95%   Body mass index is 41.44 kg/m.   Physical Exam  Nursing note and vitals reviewed. Constitutional: She is oriented to person, place, and time. She appears well-developed. She does not appear ill. No distress.  HENT:  Head: Normocephalic and atraumatic.  Eyes: Conjunctivae are normal.  Cardiovascular: Normal rate and regular rhythm.  Murmur (SEM I/VI RUSB >LUSB) heard. Respiratory: Effort normal and breath sounds normal. No respiratory distress.  Musculoskeletal: She exhibits no edema.       Cervical back: She exhibits decreased range of motion, tenderness and spasm. She exhibits no bony tenderness.       Back:  Tenderness upon palpation of trapezium and left cervical paraspinal muscles. Pain with palpation of left interscapular area.  Pain also with cervical ROM, which is limited.  Lymphadenopathy:    She has no cervical adenopathy.  Neurological: She is alert and oriented to person, place, and time. She has normal strength.  stable gait with no assistance.  Skin: Skin is warm. No rash noted. No erythema.  Psychiatric: She has a normal mood and affect.  Well groomed, good eye contact.      ASSESSMENT AND PLAN:   [name] was seen today for bilateral shoulder pain.  Diagnoses and all orders for this  visit:  Cervicalgia  We discussed cervical X ray results: Degenerative changes. PT evaluation will be arranged. Stop Methocarbamol. She will try Zanaflex. After discussion of side effects ,she agrees with taking short course of Tramadol, 25 mg bid. Local ice may also help.  -     Ambulatory referral to Physical Therapy -     Tizanidine (ZANAFLEX) 2 MG capsule; Take 1 capsule (2 mg total) by mouth 2 (two) times daily as needed for up to 15 days for muscle spasms. -     traMADol (ULTRAM) 50 MG tablet; Take 0.5 tablets (25 mg total) by mouth at bedtime as needed for up to 7 days.  Occipital headache  Tension like headache related to cervical pain. ROM/stretching cervical exercises recommended. Instructed about warning signs   -     traMADol (ULTRAM) 50 MG tablet; Take  0.5 tablets (25 mg total) by mouth at bedtime as needed for up to 7 days.  Upper back pain on left side  Massage. Zanaflex side effects discussed. PT referral placed.  -     Ambulatory referral to Physical Therapy -     traMADol (ULTRAM) 50 MG tablet; Take 0.5 tablets (25 mg total) by mouth at bedtime as needed for up to 7 days.     -Ms.Karema Cantrall was advised to seek immediate medical attention if sudden worsening symptoms or to follow if they persist or if new concerns arise.       [name]. [name], MD  [facility]. [place] office.

## 2017-09-14 NOTE — Patient Instructions (Signed)
A few things to remember from today's visit:   Cervicalgia - Plan: Ambulatory referral to Physical Therapy, tizanidine (ZANAFLEX) 2 MG capsule, traMADol (ULTRAM) 50 MG tablet  Stop Methocarbamol. Tramadol 1/2 tab at bedtime. Fall precautions.   Please be sure medication list is accurate. If a new problem present, please set up appointment sooner than planned today.

## 2017-09-15 ENCOUNTER — Other Ambulatory Visit: Payer: Self-pay | Admitting: *Deleted

## 2017-09-15 MED ORDER — TIZANIDINE HCL 2 MG PO TABS: 2.0000 mg | ORAL_TABLET | Freq: Four times a day (QID) | ORAL | 0 refills | Status: DC | PRN

## 2017-09-16 DIAGNOSIS — N2581 Secondary hyperparathyroidism of renal origin: Secondary | ICD-10-CM | POA: Diagnosis not present

## 2017-09-16 DIAGNOSIS — D631 Anemia in chronic kidney disease: Secondary | ICD-10-CM | POA: Diagnosis not present

## 2017-09-16 DIAGNOSIS — N186 End stage renal disease: Secondary | ICD-10-CM | POA: Diagnosis not present

## 2017-09-17 ENCOUNTER — Other Ambulatory Visit: Payer: Self-pay

## 2017-09-17 ENCOUNTER — Encounter: Payer: Self-pay | Admitting: Physical Therapy

## 2017-09-17 ENCOUNTER — Ambulatory Visit: Payer: Medicare Other | Attending: Family Medicine | Admitting: Physical Therapy

## 2017-09-17 DIAGNOSIS — R252 Cramp and spasm: Secondary | ICD-10-CM | POA: Diagnosis not present

## 2017-09-17 DIAGNOSIS — M542 Cervicalgia: Secondary | ICD-10-CM | POA: Insufficient documentation

## 2017-09-17 NOTE — Therapy (Signed)
[facility] [address], [place], [place], [address] Phone: [phone]   Fax:  [phone]  Physical Therapy Evaluation  Patient Details  Name: Jane [name] MRN: [id number] Date of Birth: [birth year] Referring Provider: Dr. [name]   Encounter Date: 09/17/2017  PT End of Session - 09/17/17 1641    Visit Number  1    Date for PT Re-Evaluation  11/12/17    Authorization Type  Medicare     PT Start Time  8338    PT Stop Time  1650    PT Time Calculation (min)  45 min    Activity Tolerance  Patient tolerated treatment well       Past Medical History:  Diagnosis Date  . Acid reflux   . Atrial fibrillation ([place])   . Hypertension   . Renal disorder   . Sleep apnea     Past Surgical History:  Procedure Laterality Date  . IR GENERIC HISTORICAL  07/09/2016   IR US GUIDE VASC ACCESS RIGHT 07/09/2016 Arne Cleveland, MD [facility]  . IR GENERIC HISTORICAL  07/09/2016   IR FLUORO GUIDE CV LINE RIGHT 07/09/2016 Arne Cleveland, MD [facility]  . KNEE ARTHROPLASTY    . LAPAROSCOPIC SIGMOID COLECTOMY N/A 07/11/2016   Procedure: LAPAROSCOPIC SIGMOID COLECTOMY;  Surgeon: Clovis Riley, MD;  Location: [facility];  Service: General;  Laterality: N/A;  . TUBAL LIGATION      There were no vitals filed for this visit.   Subjective Assessment - 09/17/17 1606    Subjective  Woke up in early Feb and couldn't move my head and I had a knot on my shoulder.  Used [employer] and had someone massage it.  Improved then came back even worse.  I couldn't even stand up.  Pain referred up into temples and ears.  Pulling so hard so couldn't turn head or look up.      Pertinent History  Back surgery 2016 fusion with continued injections; ESRD on dialysis, Htn;  A-fib;  sleep apnea    Limitations  House hold activities    Diagnostic tests  x-ray lots of arthritis    Patient Stated Goals  be able to move freely and what to do if this  happens again    Currently in Pain?  Yes    Pain Score  5     Pain Location  Neck    Pain Orientation  Right;Left    Pain Type  Chronic pain    Aggravating Factors   lying on left side;  turning head    Pain Relieving Factors  heat          [facility] PT Assessment - 09/17/17 0001      Assessment   Medical Diagnosis  cervicalgia    Referring Provider  Dr. [name]    Onset Date/Surgical Date  -- early Feb    Hand Dominance  Right    Next MD Visit  not scheduled    Prior Therapy  LBP many years ago and TKR      Precautions   Precautions  None      Restrictions   Weight Bearing Restrictions  No      Balance Screen   Has the patient fallen in the past 6 months  No    Has the patient had a decrease in activity level because of a fear of falling?   No    Is the patient reluctant to leave  their home because of a fear of falling?   No      Home Environment   Living Environment  Private residence    Living Arrangements  Non-relatives/Friends    Available Help at Discharge  Friend(s)    Type of [place] Access  Level entry    [place]  One level      Observation/Other Assessments   Focus on Therapeutic Outcomes (FOTO)   55% limitation       Posture/Postural Control   Postural Limitations  Rounded Shoulders;Forward head      AROM   Right/Left Shoulder  -- bil 140 degrees with neck pain on left    Cervical Flexion  60    Cervical Extension  40    Cervical - Right Side Bend  20    Cervical - Left Side Bend  10 very painful    Cervical - Right Rotation  30    Cervical - Left Rotation  20 left painful      Strength   Cervical Flexion  3+/5    Cervical Extension  3+/5    Cervical - Right Side Bend  3+/5    Cervical - Left Side Bend  3+/5    Cervical - Right Rotation  3+/5    Cervical - Left Rotation  3+/5      Palpation   Palpation comment  marked tender points in bil upper traps, cervical paraspinals, suboccipitals and left SCM      Distraction Test    Findngs  Negative             Objective measurements completed on examination: See above findings.      [place] Adult PT Treatment/Exercise - 09/17/17 0001      Moist Heat Therapy   Number Minutes Moist Heat  12 Minutes    Moist Heat Location  Cervical      Electrical Stimulation   Electrical Stimulation Location  cervical    Electrical Stimulation Action  IFC    Electrical Stimulation Parameters  10 ma 12 min seated    Electrical Stimulation Goals  Pain      Manual Therapy   Manual Therapy  Soft tissue mobilization    Soft tissue mobilization  gentle soft tissue work in supine with head of bed elevated;  seated upper trap trigger point release             PT Education - 09/17/17 1640    Education provided  Yes    Education Details  TENS unit     Person(s) Educated  Patient    Methods  Explanation;Demonstration       PT Short Term Goals - 09/17/17 1712      PT SHORT TERM GOAL #1   Title  The patient will be able to perform a basic HEP for cervical ROM    Time  4    Period  Weeks    Status  New    Target Date  10/15/17      PT SHORT TERM GOAL #2   Title  The patient will report a 30% improvement in pain with lying down and basic home ADLs    Time  4    Period  Weeks    Status  New      PT SHORT TERM GOAL #3   Title  The patient will have improved left sidbending to 20 degrees and left rotation to 30 degrees needed for personal  care, grooming and dressing    Time  4    Period  Weeks    Status  New      PT SHORT TERM GOAL #4   Title  Patient will have bilateral shoulder elevation to 150 degrees bilaterally to reach higher shelves    Time  4    Period  Weeks    Status  New        PT Long Term Goals - 09/17/17 1717      PT LONG TERM GOAL #1   Title  The patient will be independent in a safe self progression of HEP    Time  8    Period  Weeks    Status  New    Target Date  11/12/17      PT LONG TERM GOAL #2   Title  The patient will  report a 60% reduction in pain with lying down/sleep    Time  8    Period  Weeks    Status  New      PT LONG TERM GOAL #3   Title  The patient will have improved cervical sidebending to 25 degrees bilaterally and rotation to 35 degrees bilaterally needed for ADLs    Time  8    Period  Weeks    Status  New      PT LONG TERM GOAL #4   Title  The patient will have bilateral UE ROM to 150 degrees needed for overhead reaching    Time  8    Period  Weeks    Status  New      PT LONG TERM GOAL #5   Title  FOTO functional outcome score improved from 55% limitation to 42% indicating improved function with less pain    Time  8    Period  Weeks    Status  New             Plan - 09/17/17 1657    Clinical Impression Statement  The patient has  a history a spinal pain, mostly low back, but a few weeks ago she awoke with "knots" in her neck muscles and the inability to move her head at all.  She reports the pain improved somewhat but then worsened again.  She continues to have painful and limited ROM especially with left sidebending and left rotation.  She has marked trigger points in bilateral upper traps with referring pain into her temples and ear upon palpation.  Forward head sitting posture.  After modalities and manual therapy, the patient has a signifcant improvement in pain and cervical ROM and is able to sidebend and turn her head with ease.      History and Personal Factors relevant to plan of care:  ESRD on dialysis 3x/week MWF; hx of lumbar fusion and is unable to lie prone;  A-fib, HTN, sleep apnea (unable to lie flat) numerous co-morbidities    Clinical Presentation  Evolving    Clinical Presentation due to:  lack of home support; numerous co-morbidities; worsening over the past month    Clinical Decision Making  Moderate    Rehab Potential  Good    Clinical Impairments Affecting Rehab Potential  see co-morbidities above    PT Frequency  2x / week    PT Duration  8 weeks    PT  Treatment/Interventions  ADLs/Self Care Home Management;Electrical Stimulation;Ultrasound;Moist Heat;Therapeutic exercise;Therapeutic activities;Neuromuscular re-education;Patient/family education;Taping;Dry needling    PT Next Visit Plan  gentle soft  tissue work and trigger point release in sitting or head of bed elevated;  modalities;  instruct in cervical ROM/stretches; posture ex    Recommended Other Services  patient given info on home TENS    Consulted and Agree with Plan of Care  Patient       Patient will benefit from skilled therapeutic intervention in order to improve the following deficits and impairments:  Pain, Increased fascial restricitons, Increased muscle spasms, Postural dysfunction, Decreased activity tolerance, Decreased range of motion, Decreased strength, Impaired UE functional use  Visit Diagnosis: Cervicalgia - Plan: PT plan of care cert/re-cert  Cramp and spasm - Plan: PT plan of care cert/re-cert     Problem List Patient Active Problem List   Diagnosis Date Noted  . GERD (gastroesophageal reflux disease) 06/16/2017  . Constipation 10/16/2016  . Obesity with serious comorbidity 10/16/2016  . Diverticulitis of large intestine with perforation 06/26/2016  . Anemia in other chronic diseases classified elsewhere 06/25/2016  . Chest pain with moderate risk for cardiac etiology 06/25/2016  . ESRD on hemodialysis ([place]) 06/30/2012  . Paroxysmal atrial fibrillation ([place]) 06/30/2012  . Hypertension with renal disease 06/30/2012   Ruben Im, PT 09/17/17 5:30 PM Phone: [phone] Fax: [phone]  Alvera Singh 09/17/2017, 5:29 PM  [facility] [address], [place], [place], [address] Phone: [phone]   Fax:  [phone]  Name: Aftan Vint MRN: [id number] Date of Birth: [birth year]

## 2017-09-17 NOTE — Patient Instructions (Signed)
   TENS UNIT  This is helpful for muscle pain and spasm.   Search and Purchase a TENS 7000 2nd edition at www.tenspros.com or www.amazon.com  (It should be less than $30)     TENS unit instructions:   Do not shower or bathe with the unit on  Turn the unit off before removing electrodes or batteries  If the electrodes lose stickiness add a drop of water to the electrodes after they are disconnected from the unit and place on plastic sheet. If you continued to have difficulty, call the TENS unit company to purchase more electrodes.  Do not apply lotion on the skin area prior to use. Make sure the skin is clean and dry as this will help prolong the life of the electrodes.  After use, always check skin for unusual red areas, rash or other skin difficulties. If there are any skin problems, does not apply electrodes to the same area.  Never remove the electrodes from the unit by pulling the wires.  Do not use the TENS unit or electrodes other than as directed.  Do not change electrode placement without consulting your therapist or physician.  Keep 2 fingers with between each electrode.   Stacy Simpson PT [facility] [address], [address] [place], [place] [address] Phone # [phone] Fax [phone] 

## 2017-09-18 DIAGNOSIS — I158 Other secondary hypertension: Secondary | ICD-10-CM | POA: Diagnosis not present

## 2017-09-18 DIAGNOSIS — N186 End stage renal disease: Secondary | ICD-10-CM | POA: Diagnosis not present

## 2017-09-18 DIAGNOSIS — Z992 Dependence on renal dialysis: Secondary | ICD-10-CM | POA: Diagnosis not present

## 2017-09-18 DIAGNOSIS — N2581 Secondary hyperparathyroidism of renal origin: Secondary | ICD-10-CM | POA: Diagnosis not present

## 2017-09-18 DIAGNOSIS — D631 Anemia in chronic kidney disease: Secondary | ICD-10-CM | POA: Diagnosis not present

## 2017-09-21 DIAGNOSIS — N2581 Secondary hyperparathyroidism of renal origin: Secondary | ICD-10-CM | POA: Diagnosis not present

## 2017-09-21 DIAGNOSIS — N186 End stage renal disease: Secondary | ICD-10-CM | POA: Diagnosis not present

## 2017-09-21 DIAGNOSIS — D631 Anemia in chronic kidney disease: Secondary | ICD-10-CM | POA: Diagnosis not present

## 2017-09-22 ENCOUNTER — Encounter: Payer: Self-pay | Admitting: Physical Therapy

## 2017-09-22 ENCOUNTER — Ambulatory Visit: Payer: Medicare Other | Attending: Family Medicine | Admitting: Physical Therapy

## 2017-09-22 DIAGNOSIS — R252 Cramp and spasm: Secondary | ICD-10-CM | POA: Diagnosis not present

## 2017-09-22 DIAGNOSIS — M542 Cervicalgia: Secondary | ICD-10-CM

## 2017-09-22 NOTE — Patient Instructions (Signed)
    AROM: Neck Rotation   Turn head slowly to look over one shoulder, then the other. Hold each position _5___ seconds. Repeat __3__ times per set. Do __1__ sets per session. Do _4___ sessions per day.  http://orth.exer.us/294   Copyright  VHI. All rights reserved.  AROM: Lateral Neck Flexion   Slowly tilt head toward one shoulder, then the other. Hold each position __5__ seconds. Repeat _3___ times per set. Do __1__ sets per session. Do ___4_ sessions per day.  http://orth.exer.us/296   Copyright  VHI. All rights reserved.  Extension   Hands behind neck, bend head back as far as is comfortable. Hold __5__ seconds. Repeat ___3_ times. Do ___4_ sessions per day.  Copyright  VHI. All rights reserved.  AROM: Neck Flexion   Bend head forward. Hold ___5_ seconds. Repeat __3__ times per set. Do ___1_ sets per session. Do __4__ sessions per day.  http://orth.exer.us/298   Copyright  VHI. All rights reserved.    Isometric Rotation    Try to turn head a little more, resist, relax then turn a little more   Put left index finger on left temple. Gently try to turn head to right, pushing against finger. Hold __5__ seconds. Repeat on the left. Push and release slowly. Repeat __5__ times. Do __4__ sessions per day.  http://gt2.exer.us/24   [facility] [address] [place], [place] [address] Phone # [phone] Fax [phone]

## 2017-09-22 NOTE — Therapy (Signed)
[facility] [address] [place], [place], [address] Phone: [phone]   Fax:  [phone]  Physical Therapy Treatment  Patient Details  Name: Jane [name] MRN: [id number] Date of Birth: 06-Jun-1946 Referring Provider: Dr. [name]   Encounter Date: 09/22/2017  PT End of Session - 09/22/17 1042    Visit Number  2    Date for PT Re-Evaluation  11/12/17    Authorization Type  Medicare     PT Start Time  0750    PT Stop Time  0835    PT Time Calculation (min)  45 min    Activity Tolerance  Patient tolerated treatment well       Past Medical History:  Diagnosis Date  . Acid reflux   . Atrial fibrillation ([place])   . Hypertension   . Renal disorder   . Sleep apnea     Past Surgical History:  Procedure Laterality Date  . IR GENERIC HISTORICAL  07/09/2016   IR US GUIDE VASC ACCESS RIGHT 07/09/2016 Arne Cleveland, MD [facility]  . IR GENERIC HISTORICAL  07/09/2016   IR FLUORO GUIDE CV LINE RIGHT 07/09/2016 Arne Cleveland, MD [facility]  . KNEE ARTHROPLASTY    . LAPAROSCOPIC SIGMOID COLECTOMY N/A 07/11/2016   Procedure: LAPAROSCOPIC SIGMOID COLECTOMY;  Surgeon: Clovis Riley, MD;  Location: [place];  Service: General;  Laterality: N/A;  . TUBAL LIGATION      There were no vitals filed for this visit.  Subjective Assessment - 09/22/17 0753    Subjective  Turning head better but still limited with going left.  Pain better but still there.  Presents with new TENS unit but unsure how to use.  I haven't been taking any muscle relaxers.      Currently in Pain?  No/denies    Pain Score  0-No pain only with turning left    Pain Location  Neck    Pain Orientation  Left    Pain Type  Chronic pain    Aggravating Factors   turning to the left                      [facility] Adult PT Treatment/Exercise - 09/22/17 0001      Neck Exercises: Seated   Cervical Rotation  Right;Left;5 reps    Lateral  Flexion  Right;Left;5 reps    Other Seated Exercise  cervical flexion and extension 5x each      Moist Heat Therapy   Number Minutes Moist Heat  10 Minutes concurrent with HEP instruction      Electrical Stimulation   Electrical Stimulation Location  cervical    Electrical Stimulation Action  instruction in set up, donning and doffing of home TENs unit    Electrical Stimulation Parameters  Norm mode home wear times    Electrical Stimulation Goals  Pain      Manual Therapy   Soft tissue mobilization  Graston instrument assisted cervical musculature     Myofascial Release  suboccipital release     Manual Traction  3 x 20 sec    Muscle Energy Technique  upper trap contract relax; rotation contract relax 3x 5 sec eac        Manual work in sitting and supine.      PT Education - 09/22/17 1042    Education provided  Yes    Education Details  cervical Rom all planes;  self contract-relax with left rotation  Person(s) Educated  Patient    Methods  Explanation;Demonstration;Handout    Comprehension  Verbalized understanding;Returned demonstration       PT Short Term Goals - 09/17/17 1712      PT SHORT TERM GOAL #1   Title  The patient will be able to perform a basic HEP for cervical ROM    Time  4    Period  Weeks    Status  New    Target Date  10/15/17      PT SHORT TERM GOAL #2   Title  The patient will report a 30% improvement in pain with lying down and basic home ADLs    Time  4    Period  Weeks    Status  New      PT SHORT TERM GOAL #3   Title  The patient will have improved left sidbending to 20 degrees and left rotation to 30 degrees needed for personal care, grooming and dressing    Time  4    Period  Weeks    Status  New      PT SHORT TERM GOAL #4   Title  Patient will have bilateral shoulder elevation to 150 degrees bilaterally to reach higher shelves    Time  4    Period  Weeks    Status  New        PT Long Term Goals - 09/17/17 1717      PT  LONG TERM GOAL #1   Title  The patient will be independent in a safe self progression of HEP    Time  8    Period  Weeks    Status  New    Target Date  11/12/17      PT LONG TERM GOAL #2   Title  The patient will report a 60% reduction in pain with lying down/sleep    Time  8    Period  Weeks    Status  New      PT LONG TERM GOAL #3   Title  The patient will have improved cervical sidebending to 25 degrees bilaterally and rotation to 35 degrees bilaterally needed for ADLs    Time  8    Period  Weeks    Status  New      PT LONG TERM GOAL #4   Title  The patient will have bilateral UE ROM to 150 degrees needed for overhead reaching    Time  8    Period  Weeks    Status  New      PT LONG TERM GOAL #5   Title  FOTO functional outcome score improved from 55% limitation to 42% indicating improved function with less pain    Time  8    Period  Weeks    Status  New            Plan - 09/22/17 1043    Clinical Impression Statement  The patient presents with home TENs unit and was instructed in home use.  She reports a good understanding on how to use and precautions.  She has much improved soft tissue mobility compared to initial evaluation and improved cervical ROM although her primary complaint today is limited left rotation.  Following treatment session she reports she can turn to the left much further.  Will need continued soft tissue work especially suboccipitals and lateral joint mobs for rotation.      Rehab Potential  Good  Clinical Impairments Affecting Rehab Potential  see co-morbidities above    PT Frequency  2x / week    PT Duration  8 weeks    PT Treatment/Interventions  ADLs/Self Care Home Management;Electrical Stimulation;Ultrasound;Moist Heat;Therapeutic exercise;Therapeutic activities;Neuromuscular re-education;Patient/family education;Taping;Dry needling    PT Next Visit Plan  gentle soft tissue work and trigger point release in sitting or head of bed elevated  especially suboccipitals and rotational mobs with movement;    modalities;   posture ex    Recommended Other Services  has home TENS unit       Patient will benefit from skilled therapeutic intervention in order to improve the following deficits and impairments:  Pain, Increased fascial restricitons, Increased muscle spasms, Postural dysfunction, Decreased activity tolerance, Decreased range of motion, Decreased strength, Impaired UE functional use  Visit Diagnosis: Cervicalgia  Cramp and spasm     Problem List Patient Active Problem List   Diagnosis Date Noted  . GERD (gastroesophageal reflux disease) 06/16/2017  . Constipation 10/16/2016  . Obesity with serious comorbidity 10/16/2016  . Diverticulitis of large intestine with perforation 06/26/2016  . Anemia in other chronic diseases classified elsewhere 06/25/2016  . Chest pain with moderate risk for cardiac etiology 06/25/2016  . ESRD on hemodialysis ([place]) 06/30/2012  . Paroxysmal atrial fibrillation ([place]) 06/30/2012  . Hypertension with renal disease 06/30/2012    Ruben Im, PT 09/22/17 10:56 AM Phone: [phone] Fax: [phone]  Alvera Singh 09/22/2017, 10:56 AM  [facility] [address], [place], [place], [address] Phone: [phone]   Fax:  [phone]  Name: Jane [name] MRN: [id number] Date of Birth: [birth year]

## 2017-09-23 DIAGNOSIS — D631 Anemia in chronic kidney disease: Secondary | ICD-10-CM | POA: Diagnosis not present

## 2017-09-23 DIAGNOSIS — N186 End stage renal disease: Secondary | ICD-10-CM | POA: Diagnosis not present

## 2017-09-23 DIAGNOSIS — N2581 Secondary hyperparathyroidism of renal origin: Secondary | ICD-10-CM | POA: Diagnosis not present

## 2017-09-24 ENCOUNTER — Ambulatory Visit: Payer: Medicare Other | Admitting: Physical Therapy

## 2017-09-24 ENCOUNTER — Encounter: Payer: Self-pay | Admitting: Physical Therapy

## 2017-09-24 DIAGNOSIS — M542 Cervicalgia: Secondary | ICD-10-CM | POA: Diagnosis not present

## 2017-09-24 DIAGNOSIS — R252 Cramp and spasm: Secondary | ICD-10-CM

## 2017-09-24 NOTE — Therapy (Signed)
[facility] [address], [place], [place], [address] Phone: [phone]   Fax:  [phone]  Physical Therapy Treatment  Patient Details  Name: Jane [name] MRN: [id number] Date of Birth: 02/14/1946 Referring Provider: Dr. [name]   Encounter Date: 09/24/2017  PT End of Session - 09/24/17 0833    Visit Number  3    Date for PT Re-Evaluation  11/12/17    Authorization Type  Medicare     PT Stop Time  4431    Activity Tolerance  Patient tolerated treatment well       Past Medical History:  Diagnosis Date  . Acid reflux   . Atrial fibrillation ([place])   . Hypertension   . Renal disorder   . Sleep apnea     Past Surgical History:  Procedure Laterality Date  . IR GENERIC HISTORICAL  07/09/2016   IR US GUIDE VASC ACCESS RIGHT 07/09/2016 Arne Cleveland, MD [facility]  . IR GENERIC HISTORICAL  07/09/2016   IR FLUORO GUIDE CV LINE RIGHT 07/09/2016 Arne Cleveland, MD [facility]  . KNEE ARTHROPLASTY    . LAPAROSCOPIC SIGMOID COLECTOMY N/A 07/11/2016   Procedure: LAPAROSCOPIC SIGMOID COLECTOMY;  Surgeon: Clovis Riley, MD;  Location: [place];  Service: General;  Laterality: N/A;  . TUBAL LIGATION      There were no vitals filed for this visit.  Subjective Assessment - 09/24/17 0801    Subjective  Tight with turning to the left.   Used the TEN unit once yesterday.  I did do my exercise after dialysis yesterday.  She reports her whole body is sore today after stumbling and fall yesterday.      Pertinent History  Back surgery 2016 fusion with continued injections; ESRD on dialysis, Htn;  A-fib;  sleep apnea    Currently in Pain?  Yes    Pain Score  4  only hurts when I move a certain way    Pain Location  Neck    Pain Orientation  Left    Pain Type  Chronic pain         [facility] PT Assessment - 09/24/17 0001      AROM   Cervical Extension  55    Cervical - Right Side Bend  30    Cervical - Left Side  Bend  27    Cervical - Right Rotation  38    Cervical - Left Rotation  42                  [facility] Adult PT Treatment/Exercise - 09/24/17 0001      Neck Exercises: Seated   Neck Retraction  10 reps    Neck Retraction Limitations  -- with small blue ball    Cervical Rotation  Right;Left;10 reps    Cervical Rotation Limitations  with small blue ball assist    Other Seated Exercise  upper Cspine flex nods with small blue ball assist 10x    Other Seated Exercise  thoracic extension with small blue ball assist 10x      Moist Heat Therapy   Number Minutes Moist Heat  10 Minutes while discussing response and HEP      Manual Therapy   Soft tissue mobilization  bil cervical paraspinals, bil upper traps and suboccipitals       Trigger Point Dry Needling - 09/24/17 0831    Consent Given?  Yes    Education Handout Provided  Yes  Muscles Treated Upper Body  Upper trapezius;Suboccipitals muscle group bil cervical paraspinals    Upper Trapezius Response  Twitch reponse elicited;Palpable increased muscle length    SubOccipitals Response  Twitch response elicited;Palpable increased muscle length      Performed bilaterally     PT Education - 09/24/17 0833    Education provided  Yes    Education Details  dry needling after care    Methods  Explanation;Handout    Comprehension  Verbalized understanding       PT Short Term Goals - 09/24/17 0848      PT SHORT TERM GOAL #1   Title  The patient will be able to perform a basic HEP for cervical ROM    Status  Achieved      PT SHORT TERM GOAL #2   Title  The patient will report a 30% improvement in pain with lying down and basic home ADLs    Time  4    Period  Weeks    Status  On-going      PT SHORT TERM GOAL #3   Title  The patient will have improved left sidbending to 20 degrees and left rotation to 30 degrees needed for personal care, grooming and dressing    Time  4    Period  Weeks    Status  Partially Met      PT  SHORT TERM GOAL #4   Title  Patient will have bilateral shoulder elevation to 150 degrees bilaterally to reach higher shelves    Time  4    Period  Weeks    Status  On-going        PT Long Term Goals - 09/17/17 1717      PT LONG TERM GOAL #1   Title  The patient will be independent in a safe self progression of HEP    Time  8    Period  Weeks    Status  New    Target Date  11/12/17      PT LONG TERM GOAL #2   Title  The patient will report a 60% reduction in pain with lying down/sleep    Time  8    Period  Weeks    Status  New      PT LONG TERM GOAL #3   Title  The patient will have improved cervical sidebending to 25 degrees bilaterally and rotation to 35 degrees bilaterally needed for ADLs    Time  8    Period  Weeks    Status  New      PT LONG TERM GOAL #4   Title  The patient will have bilateral UE ROM to 150 degrees needed for overhead reaching    Time  8    Period  Weeks    Status  New      PT LONG TERM GOAL #5   Title  FOTO functional outcome score improved from 55% limitation to 42% indicating improved function with less pain    Time  8    Period  Weeks    Status  New            Plan - 09/24/17 8469    Clinical Impression Statement  The patient is interested in trying dry needling today as mentioned by Dr. [name].  Following DN and manual therapy she has much improved soft tissue mobility.  Numerous tender points in bilateral upper traps and suboccipitals.   She is able to  tolerate prone lying fairly well.   Significant improvements in cervical ROM in all planes.    She is able to hold her head upright now instead of flexed forward posture.  Verbal cues to maintain postural alignment with sitting exercises.     Rehab Potential  Good    Clinical Impairments Affecting Rehab Potential  see co-morbidities above    PT Frequency  2x / week    PT Duration  8 weeks    PT Treatment/Interventions  ADLs/Self Care Home Management;Electrical  Stimulation;Ultrasound;Moist Heat;Therapeutic exercise;Therapeutic activities;Neuromuscular re-education;Patient/family education;Taping;Dry needling    PT Next Visit Plan  assess response to DN #1;  continue cervical ROM especially left rotation;  moist heat;  posture ex; check shoulder ROM    Recommended Other Services  cert signed       Patient will benefit from skilled therapeutic intervention in order to improve the following deficits and impairments:  Pain, Increased fascial restricitons, Increased muscle spasms, Postural dysfunction, Decreased activity tolerance, Decreased range of motion, Decreased strength, Impaired UE functional use  Visit Diagnosis: Cervicalgia  Cramp and spasm     Problem List Patient Active Problem List   Diagnosis Date Noted  . GERD (gastroesophageal reflux disease) 06/16/2017  . Constipation 10/16/2016  . Obesity with serious comorbidity 10/16/2016  . Diverticulitis of large intestine with perforation 06/26/2016  . Anemia in other chronic diseases classified elsewhere 06/25/2016  . Chest pain with moderate risk for cardiac etiology 06/25/2016  . ESRD on hemodialysis ([place]) 06/30/2012  . Paroxysmal atrial fibrillation ([place]) 06/30/2012  . Hypertension with renal disease 06/30/2012    Ruben Im, PT 09/24/17 8:50 AM Phone: [phone] Fax: [phone]  Jane [name] 09/24/2017, 8:49 AM  [facility] [address], [place], [place], [address] Phone: [phone]   Fax:  [phone]  Name: Jane [name] MRN: [id number] Date of Birth: April 28, 1946

## 2017-09-24 NOTE — Patient Instructions (Signed)
     Trigger Point Dry Needling  . What is Trigger Point Dry Needling (DN)? o DN is a physical therapy technique used to treat muscle pain and dysfunction. Specifically, DN helps deactivate muscle trigger points (muscle knots).  o A thin filiform needle is used to penetrate the skin and stimulate the underlying trigger point. The goal is for a local twitch response (LTR) to occur and for the trigger point to relax. No medication of any kind is injected during the procedure.   . What Does Trigger Point Dry Needling Feel Like?  o The procedure feels different for each individual patient. Some patients report that they do not actually feel the needle enter the skin and overall the process is not painful. Very mild bleeding may occur. However, many patients feel a deep cramping in the muscle in which the needle was inserted. This is the local twitch response.   . How Will I feel after the treatment? o Soreness is normal, and the onset of soreness may not occur for a few hours. Typically this soreness does not last longer than two days.  o Bruising is uncommon, however; ice can be used to decrease any possible bruising.  o In rare cases feeling tired or nauseous after the treatment is normal. In addition, your symptoms may get worse before they get better, this period will typically not last longer than 24 hours.   . What Can I do After My Treatment? o Increase your hydration by drinking more water for the next 24 hours. o You may place ice or heat on the areas treated that have become sore, however, do not use heat on inflamed or bruised areas. Heat often brings more relief post needling. o You can continue your regular activities, but vigorous activity is not recommended initially after the treatment for 24 hours. o DN is best combined with other physical therapy such as strengthening, stretching, and other therapies.    Stacy Simpson PT [facility] [address], [address] [place], [place] [address] Phone # [phone] Fax [phone] 

## 2017-09-25 DIAGNOSIS — D631 Anemia in chronic kidney disease: Secondary | ICD-10-CM | POA: Diagnosis not present

## 2017-09-25 DIAGNOSIS — N2581 Secondary hyperparathyroidism of renal origin: Secondary | ICD-10-CM | POA: Diagnosis not present

## 2017-09-25 DIAGNOSIS — N186 End stage renal disease: Secondary | ICD-10-CM | POA: Diagnosis not present

## 2017-09-25 DIAGNOSIS — Z7901 Long term (current) use of anticoagulants: Secondary | ICD-10-CM | POA: Diagnosis not present

## 2017-09-28 DIAGNOSIS — N186 End stage renal disease: Secondary | ICD-10-CM | POA: Diagnosis not present

## 2017-09-28 DIAGNOSIS — D631 Anemia in chronic kidney disease: Secondary | ICD-10-CM | POA: Diagnosis not present

## 2017-09-28 DIAGNOSIS — N2581 Secondary hyperparathyroidism of renal origin: Secondary | ICD-10-CM | POA: Diagnosis not present

## 2017-09-29 ENCOUNTER — Encounter: Payer: Self-pay | Admitting: Physical Therapy

## 2017-09-29 ENCOUNTER — Ambulatory Visit: Payer: Medicare Other | Admitting: Physical Therapy

## 2017-09-29 DIAGNOSIS — M542 Cervicalgia: Secondary | ICD-10-CM

## 2017-09-29 DIAGNOSIS — R252 Cramp and spasm: Secondary | ICD-10-CM | POA: Diagnosis not present

## 2017-09-29 NOTE — Therapy (Signed)
[facility] [address] [place], [place], [address] Phone: [phone]   Fax:  [phone]  Physical Therapy Treatment  Patient Details  Name: Jane [name] MRN: [id number] Date of Birth: 01-24-1946 Referring Provider: Dr. [name]   Encounter Date: 09/29/2017  PT End of Session - 09/29/17 0832    Visit Number  4    Date for PT Re-Evaluation  11/12/17    Authorization Type  Medicare     PT Start Time  0800    PT Stop Time  0838    PT Time Calculation (min)  38 min    Activity Tolerance  Patient tolerated treatment well       Past Medical History:  Diagnosis Date  . Acid reflux   . Atrial fibrillation ([place])   . Hypertension   . Renal disorder   . Sleep apnea     Past Surgical History:  Procedure Laterality Date  . IR GENERIC HISTORICAL  07/09/2016   IR US GUIDE VASC ACCESS RIGHT 07/09/2016 Arne Cleveland, MD [facility]  . IR GENERIC HISTORICAL  07/09/2016   IR FLUORO GUIDE CV LINE RIGHT 07/09/2016 Arne Cleveland, MD [facility]  . KNEE ARTHROPLASTY    . LAPAROSCOPIC SIGMOID COLECTOMY N/A 07/11/2016   Procedure: LAPAROSCOPIC SIGMOID COLECTOMY;  Surgeon: Clovis Riley, MD;  Location: [place];  Service: General;  Laterality: N/A;  . TUBAL LIGATION      There were no vitals filed for this visit.  Subjective Assessment - 09/29/17 0759    Subjective  The needling was OK.  There is still a spot left levator/trap region.  My motion is better.  Denies excessive soreness.  I have to wait until someone is around to put on the TENs electrodes.      Pertinent History  Back surgery 2016 fusion with continued injections; ESRD on dialysis, Htn;  A-fib;  sleep apnea    Currently in Pain?  No/denies    Pain Score  0-No pain    Pain Location  Neck    Pain Orientation  Left    Pain Type  Chronic pain    Aggravating Factors   The way I sit at [employer]          [facility] PT Assessment - 09/29/17 0001      AROM   Right/Left Shoulder  -- 165 right, 155 left                  [facility] Adult PT Treatment/Exercise - 09/29/17 0001      Neck Exercises: Seated   Neck Retraction  10 reps    Neck Retraction Limitations  -- with small blue ball    Cervical Rotation  Right;Left;10 reps    Cervical Rotation Limitations  with small blue ball assist    Lateral Flexion  Right;Left;5 reps    Other Seated Exercise  upper Cspine flex nods with small blue ball assist 10x    Other Seated Exercise  thoracic extension with small blue ball assist 10x      Shoulder Exercises: Standing   Extension  Strengthening;Both;10 reps    Theraband Level (Shoulder Extension)  Level 1 (Yellow)    Row  Strengthening;Both;10 reps    Theraband Level (Shoulder Row)  Level 1 (Yellow)    Other Standing Exercises  tricep yellow band 10x      Moist Heat Therapy   Number Minutes Moist Heat  10 Minutes    Moist Heat Location  Cervical      Manual Therapy   Soft tissue mobilization  left levator scap and upper trap muscles       Trigger Point Dry Needling - 09/29/17 1730    Consent Given?  Yes    Muscles Treated Upper Body  Upper trapezius;Levator scapulae    Upper Trapezius Response  Twitch reponse elicited;Palpable increased muscle length    Levator Scapulae Response  Twitch response elicited;Palpable increased muscle length           PT Education - 09/29/17 0816    Education provided  Yes    Education Details  levator scap stretch;  yellow band rows, extensions    Person(s) Educated  Patient    Methods  Explanation;Demonstration;Handout    Comprehension  Returned demonstration;Verbalized understanding       PT Short Term Goals - 09/24/17 0848      PT SHORT TERM GOAL #1   Title  The patient will be able to perform a basic HEP for cervical ROM    Status  Achieved      PT SHORT TERM GOAL #2   Title  The patient will report a 30% improvement in pain with lying down and basic home ADLs    Time  4    Period   Weeks    Status  On-going      PT SHORT TERM GOAL #3   Title  The patient will have improved left sidbending to 20 degrees and left rotation to 30 degrees needed for personal care, grooming and dressing    Time  4    Period  Weeks    Status  Partially Met      PT SHORT TERM GOAL #4   Title  Patient will have bilateral shoulder elevation to 150 degrees bilaterally to reach higher shelves    Time  4    Period  Weeks    Status  On-going        PT Long Term Goals - 09/17/17 1717      PT LONG TERM GOAL #1   Title  The patient will be independent in a safe self progression of HEP    Time  8    Period  Weeks    Status  New    Target Date  11/12/17      PT LONG TERM GOAL #2   Title  The patient will report a 60% reduction in pain with lying down/sleep    Time  8    Period  Weeks    Status  New      PT LONG TERM GOAL #3   Title  The patient will have improved cervical sidebending to 25 degrees bilaterally and rotation to 35 degrees bilaterally needed for ADLs    Time  8    Period  Weeks    Status  New      PT LONG TERM GOAL #4   Title  The patient will have bilateral UE ROM to 150 degrees needed for overhead reaching    Time  8    Period  Weeks    Status  New      PT LONG TERM GOAL #5   Title  FOTO functional outcome score improved from 55% limitation to 42% indicating improved function with less pain    Time  8    Period  Weeks    Status  New            Plan -  09/29/17 1725    Clinical Impression Statement  The patient is responding well to soft tissue and myofascial work including manual therapy and DN.   Pain intensity are much improved as well as cervical and shoulder ROM.  Her posture is much improved now as well and is less guarded/antalgic with general mobility.  Therapist closely monitoring response with all treatment interventions.  On track to meet STGs.      Rehab Potential  Good    Clinical Impairments Affecting Rehab Potential  see co-morbidities above     PT Frequency  2x / week    PT Treatment/Interventions  ADLs/Self Care Home Management;Electrical Stimulation;Ultrasound;Moist Heat;Therapeutic exercise;Therapeutic activities;Neuromuscular re-education;Patient/family education;Taping;Dry needling    PT Next Visit Plan  assess response to DN #2 to levator scap muscle only ;  continue cervical ROM especially left rotation;  moist heat;  posture ex; try supine yellow band scapular ex       Patient will benefit from skilled therapeutic intervention in order to improve the following deficits and impairments:  Pain, Increased fascial restricitons, Increased muscle spasms, Postural dysfunction, Decreased activity tolerance, Decreased range of motion, Decreased strength, Impaired UE functional use  Visit Diagnosis: Cervicalgia  Cramp and spasm     Problem List Patient Active Problem List   Diagnosis Date Noted  . GERD (gastroesophageal reflux disease) 06/16/2017  . Constipation 10/16/2016  . Obesity with serious comorbidity 10/16/2016  . Diverticulitis of large intestine with perforation 06/26/2016  . Anemia in other chronic diseases classified elsewhere 06/25/2016  . Chest pain with moderate risk for cardiac etiology 06/25/2016  . ESRD on hemodialysis ([place]) 06/30/2012  . Paroxysmal atrial fibrillation ([place]) 06/30/2012  . Hypertension with renal disease 06/30/2012   Ruben Im, PT 09/29/17 5:30 PM Phone: [phone] Fax: [phone]  Alvera Singh 09/29/2017, 5:30 PM  [facility] [address], [place], [place], [address] Phone: [phone]   Fax:  [phone]  Name: Jane [name] MRN: [id number] Date of Birth: 1946/05/15

## 2017-09-29 NOTE — Patient Instructions (Addendum)
       Levator Stretch   Grasp seat or sit on hand on side to be stretched. Turn head toward other side and look down. Use hand on head to gently stretch neck in that position. Hold __20-30__ seconds. Repeat on other side. Repeat _2___ times. Do _2__ sessions per day.  http://gt2.exer.us/30   Copyright  VHI. All rights reserved.  Side-Bending   One hand on opposite side of head, pull head to side as far as is comfortable. Stop if there is pain. Hold __20-30__ seconds. Repeat with other hand to other side. Repeat ____ times. Do ____ sessions per day.   Copyright  VHI. All rights reserved.  Scapular Retraction (Standing)   With arms at sides, pinch shoulder blades together. Repeat _10___ times per set. Do __1__ sets per session. Do __2__ sessions per day.  [email or web address] [address] [place], [place] [address] Phone # [phone] Fax [phone]

## 2017-09-30 DIAGNOSIS — N186 End stage renal disease: Secondary | ICD-10-CM | POA: Diagnosis not present

## 2017-09-30 DIAGNOSIS — N2581 Secondary hyperparathyroidism of renal origin: Secondary | ICD-10-CM | POA: Diagnosis not present

## 2017-09-30 DIAGNOSIS — D631 Anemia in chronic kidney disease: Secondary | ICD-10-CM | POA: Diagnosis not present

## 2017-10-01 ENCOUNTER — Encounter: Payer: Self-pay | Admitting: Physical Therapy

## 2017-10-01 ENCOUNTER — Ambulatory Visit: Payer: Medicare Other | Admitting: Physical Therapy

## 2017-10-01 DIAGNOSIS — M542 Cervicalgia: Secondary | ICD-10-CM | POA: Diagnosis not present

## 2017-10-01 DIAGNOSIS — R252 Cramp and spasm: Secondary | ICD-10-CM | POA: Diagnosis not present

## 2017-10-01 NOTE — Patient Instructions (Signed)
   Over Head Pull: Narrow Grip       On back, knees bent, feet flat, band across thighs, elbows straight but relaxed. Pull hands apart (start). Keeping elbows straight, bring arms up and over head, hands toward floor. Keep pull steady on band. Hold momentarily. Return slowly, keeping pull steady, back to start. Repeat __5-10_ times. Band color _yellow_____   Side Pull: Double Arm   On back, knees bent, feet flat. Arms perpendicular to body, shoulder level, elbows straight but relaxed. Pull arms out to sides, elbows straight. Resistance band comes across collarbones, hands toward floor. Hold momentarily. Slowly return to starting position. Repeat __5-10_ times. Band color _yellow____   Sash   On back, knees bent, feet flat, left hand on left hip, right hand above left. Pull right arm DIAGONALLY (hip to shoulder) across chest. Bring right arm along head toward floor. Hold momentarily. Slowly return to starting position. Repeat _5-10_ times. Do with left arm. Band color ___yellow ___   Shoulder Rotation: Double Arm   On back, knees bent, feet flat, elbows tucked at sides, bent 90, hands palms up. Pull hands apart and down toward floor, keeping elbows near sides. Hold momentarily. Slowly return to starting position. Repeat __5-10_ times. Band color __yellow____     [facility] [address] [place], [place] [address] Phone # [phone] Fax [phone]

## 2017-10-01 NOTE — Therapy (Signed)
[facility] [address], [place], [place], [address] Phone: [phone]   Fax:  [phone]  Physical Therapy Treatment  Patient Details  Name: Jane [name] MRN: [id number] Date of Birth: August 23, 1945 Referring Provider: Dr. [name]   Encounter Date: 10/01/2017  PT End of Session - 10/01/17 0830    Visit Number  5    Date for PT Re-Evaluation  11/12/17    Authorization Type  Medicare     PT Start Time  0800    PT Stop Time  0848    PT Time Calculation (min)  48 min    Activity Tolerance  Patient tolerated treatment well       Past Medical History:  Diagnosis Date  . Acid reflux   . Atrial fibrillation ([place])   . Hypertension   . Renal disorder   . Sleep apnea     Past Surgical History:  Procedure Laterality Date  . IR GENERIC HISTORICAL  07/09/2016   IR US GUIDE VASC ACCESS RIGHT 07/09/2016 Arne Cleveland, MD [facility]  . IR GENERIC HISTORICAL  07/09/2016   IR FLUORO GUIDE CV LINE RIGHT 07/09/2016 Arne Cleveland, MD [facility]  . KNEE ARTHROPLASTY    . LAPAROSCOPIC SIGMOID COLECTOMY N/A 07/11/2016   Procedure: LAPAROSCOPIC SIGMOID COLECTOMY;  Surgeon: Clovis Riley, MD;  Location: [facility];  Service: General;  Laterality: N/A;  . TUBAL LIGATION      There were no vitals filed for this visit.  Subjective Assessment - 10/01/17 0802    Subjective  Just hurts with turning to the left.  No pain at rest.      Pertinent History  Back surgery 2016 fusion with continued injections; ESRD on dialysis, Htn;  A-fib;  sleep apnea    Currently in Pain?  No/denies    Pain Score  0-No pain    Pain Orientation  Left    Pain Type  Chronic pain    Aggravating Factors   turning to the left                      [facility] Adult PT Treatment/Exercise - 10/01/17 0001      Neck Exercises: Supine   Shoulder Flexion  Both;10 reps;Other (comment)    Shoulder Flexion Limitations  yellow band    Upper  Extremity D1  Extension;10 reps;Theraband    Theraband Level (UE D1)  Level 1 (Yellow)    Other Supine Exercise  external rotation yellow band 10x    Other Supine Exercise  horizontal abduction 10x yellow band       Shoulder Exercises: Standing   Extension  Strengthening;Both;10 reps;Theraband    Theraband Level (Shoulder Extension)  Level 1 (Yellow)    Row  Strengthening;Both;10 reps    Theraband Level (Shoulder Row)  Level 1 (Yellow)      Moist Heat Therapy   Number Minutes Moist Heat  10 Minutes    Moist Heat Location  Cervical      Manual Therapy   Joint Mobilization  rotation mob with movement 5x right/left;  grade 2/3 bil rotation mobs 10x C2-4    Soft tissue mobilization  Graston instrument assisted cervical musculature     Manual Traction  3 x 20 sec    Muscle Energy Technique  upper trap contract relax; rotation contract relax 3x 5 sec eac             PT Education - 10/01/17 0830  Education provided  Yes    Education Details  supine scapular ex with yellow band series    Person(s) Educated  Patient    Methods  Explanation;Demonstration;Handout    Comprehension  Verbalized understanding;Returned demonstration       PT Short Term Goals - 09/24/17 0848      PT SHORT TERM GOAL #1   Title  The patient will be able to perform a basic HEP for cervical ROM    Status  Achieved      PT SHORT TERM GOAL #2   Title  The patient will report a 30% improvement in pain with lying down and basic home ADLs    Time  4    Period  Weeks    Status  On-going      PT SHORT TERM GOAL #3   Title  The patient will have improved left sidbending to 20 degrees and left rotation to 30 degrees needed for personal care, grooming and dressing    Time  4    Period  Weeks    Status  Partially Met      PT SHORT TERM GOAL #4   Title  Patient will have bilateral shoulder elevation to 150 degrees bilaterally to reach higher shelves    Time  4    Period  Weeks    Status  On-going         PT Long Term Goals - 09/17/17 1717      PT LONG TERM GOAL #1   Title  The patient will be independent in a safe self progression of HEP    Time  8    Period  Weeks    Status  New    Target Date  11/12/17      PT LONG TERM GOAL #2   Title  The patient will report a 60% reduction in pain with lying down/sleep    Time  8    Period  Weeks    Status  New      PT LONG TERM GOAL #3   Title  The patient will have improved cervical sidebending to 25 degrees bilaterally and rotation to 35 degrees bilaterally needed for ADLs    Time  8    Period  Weeks    Status  New      PT LONG TERM GOAL #4   Title  The patient will have bilateral UE ROM to 150 degrees needed for overhead reaching    Time  8    Period  Weeks    Status  New      PT LONG TERM GOAL #5   Title  FOTO functional outcome score improved from 55% limitation to 42% indicating improved function with less pain    Time  8    Period  Weeks    Status  New            Plan - 10/01/17 1910    Clinical Impression Statement  The patient continues to improve with pain intensity and cervical and shoulder ROM.  Good response with supine scapular exercises without exacerbation of pain.  Decreased cervical joint mobility especially with rotation mobilizations.  Therapist closely monitoring response with all treatment interventions.      Rehab Potential  Good    Clinical Impairments Affecting Rehab Potential  see co-morbidities above    PT Frequency  2x / week    PT Duration  8 weeks    PT Treatment/Interventions  ADLs/Self Care  Home Management;Electrical Stimulation;Ultrasound;Moist Heat;Therapeutic exercise;Therapeutic activities;Neuromuscular re-education;Patient/family education;Taping;Dry needling    PT Next Visit Plan   DN #3 to levator scap if needed;   continue cervical ROM especially left rotation;  moist heat;  posture ex       Patient will benefit from skilled therapeutic intervention in order to improve the  following deficits and impairments:  Pain, Increased fascial restricitons, Increased muscle spasms, Postural dysfunction, Decreased activity tolerance, Decreased range of motion, Decreased strength, Impaired UE functional use  Visit Diagnosis: Cervicalgia  Cramp and spasm     Problem List Patient Active Problem List   Diagnosis Date Noted  . GERD (gastroesophageal reflux disease) 06/16/2017  . Constipation 10/16/2016  . Obesity with serious comorbidity 10/16/2016  . Diverticulitis of large intestine with perforation 06/26/2016  . Anemia in other chronic diseases classified elsewhere 06/25/2016  . Chest pain with moderate risk for cardiac etiology 06/25/2016  . ESRD on hemodialysis ([place]) 06/30/2012  . Paroxysmal atrial fibrillation ([place]) 06/30/2012  . Hypertension with renal disease 06/30/2012   Ruben Im, PT 10/01/17 7:16 PM Phone: [phone] Fax: [phone]  Alvera Singh 10/01/2017, 7:15 PM  [facility] [address], [place], [place], [address] Phone: [phone]   Fax:  [phone]  Name: Kesi Perrow MRN: [id number] Date of Birth: March 31, 1946

## 2017-10-02 DIAGNOSIS — D631 Anemia in chronic kidney disease: Secondary | ICD-10-CM | POA: Diagnosis not present

## 2017-10-02 DIAGNOSIS — N186 End stage renal disease: Secondary | ICD-10-CM | POA: Diagnosis not present

## 2017-10-02 DIAGNOSIS — N2581 Secondary hyperparathyroidism of renal origin: Secondary | ICD-10-CM | POA: Diagnosis not present

## 2017-10-05 DIAGNOSIS — D631 Anemia in chronic kidney disease: Secondary | ICD-10-CM | POA: Diagnosis not present

## 2017-10-05 DIAGNOSIS — N186 End stage renal disease: Secondary | ICD-10-CM | POA: Diagnosis not present

## 2017-10-05 DIAGNOSIS — N2581 Secondary hyperparathyroidism of renal origin: Secondary | ICD-10-CM | POA: Diagnosis not present

## 2017-10-06 ENCOUNTER — Encounter: Payer: Self-pay | Admitting: Physical Therapy

## 2017-10-06 ENCOUNTER — Ambulatory Visit: Payer: Medicare Other | Admitting: Physical Therapy

## 2017-10-06 DIAGNOSIS — R252 Cramp and spasm: Secondary | ICD-10-CM

## 2017-10-06 DIAGNOSIS — M542 Cervicalgia: Secondary | ICD-10-CM

## 2017-10-06 NOTE — Patient Instructions (Signed)
Jane [name] PT [facility] [address], [address] [place], [place] [address] Phone # [phone] Fax [phone]    

## 2017-10-06 NOTE — Therapy (Signed)
[facility] [address], [place], [place], [address] Phone: [phone]   Fax:  [phone]  Physical Therapy Treatment  Patient Details  Name: Jane [name] MRN: [id number] Date of Birth: 1945-11-08 Referring Provider: Dr. [name]   Encounter Date: 10/06/2017  PT End of Session - 10/06/17 0823    Visit Number  6    Date for PT Re-Evaluation  11/12/17    Authorization Type  Medicare     PT Start Time  0804    PT Stop Time  0842    PT Time Calculation (min)  38 min    Activity Tolerance  Patient tolerated treatment well       Past Medical History:  Diagnosis Date  . Acid reflux   . Atrial fibrillation ([place])   . Hypertension   . Renal disorder   . Sleep apnea     Past Surgical History:  Procedure Laterality Date  . IR GENERIC HISTORICAL  07/09/2016   IR US GUIDE VASC ACCESS RIGHT 07/09/2016 Arne Cleveland, MD [facility]  . IR GENERIC HISTORICAL  07/09/2016   IR FLUORO GUIDE CV LINE RIGHT 07/09/2016 Arne Cleveland, MD [facility]  . KNEE ARTHROPLASTY    . LAPAROSCOPIC SIGMOID COLECTOMY N/A 07/11/2016   Procedure: LAPAROSCOPIC SIGMOID COLECTOMY;  Surgeon: Clovis Riley, MD;  Location: [facility];  Service: General;  Laterality: N/A;  . TUBAL LIGATION      There were no vitals filed for this visit.  Subjective Assessment - 10/06/17 0805    Subjective  My neck is doing very well but the cold bothers my low back and my arthritis in my hands and knee have been bothering me.  My neck is painfree.   "I'm just about 100% better."     Pertinent History  Back surgery 2016 fusion with continued injections; ESRD on dialysis, Htn;  A-fib;  sleep apnea    Currently in Pain?  No/denies    Pain Score  0-No pain    Pain Type  Chronic pain                      [facility] Adult PT Treatment/Exercise - 10/06/17 0001      Therapeutic Activites    Therapeutic Activities  ADL's    ADL's  turning head;  reaching,pushing/pulling      Neuro Re-ed    Neuro Re-ed Details   postural muscle activation       Neck Exercises: Seated   Neck Retraction  10 reps    Cervical Rotation  Right;Left;10 reps    Cervical Rotation Limitations  with small blue ball assist    Lateral Flexion  --    Other Seated Exercise  upper Cspine flex nods with small blue ball assist 10x    Other Seated Exercise  thoracic extension with small blue ball assist 10x      Shoulder Exercises: Standing   Extension  Strengthening;Both;20 reps;Theraband    Theraband Level (Shoulder Extension)  Level 1 (Yellow)    Row  Strengthening;Both;20 reps;Theraband    Theraband Level (Shoulder Row)  Level 1 (Yellow)      Shoulder Exercises: ROM/Strengthening   Nustep  10 min L1 Seat 10    Other ROM/Strengthening Exercises  shoulder pulleys with neck rotation 2 min    Other ROM/Strengthening Exercises  wall exercises for posture: arm lifts, snow angels, shoulder extension isometrics 10x each  PT Education - 10/06/17 0835    Education provided  Yes    Education Details  wall posture ex    Person(s) Educated  Patient    Methods  Explanation;Demonstration;Handout    Comprehension  Returned demonstration;Verbalized understanding       PT Short Term Goals - 10/06/17 0840      PT SHORT TERM GOAL #1   Title  The patient will be able to perform a basic HEP for cervical ROM    Status  Achieved      PT SHORT TERM GOAL #2   Title  The patient will report a 30% improvement in pain with lying down and basic home ADLs    Status  Achieved      PT SHORT TERM GOAL #3   Title  The patient will have improved left sidbending to 20 degrees and left rotation to 30 degrees needed for personal care, grooming and dressing    Status  Partially Met      PT SHORT TERM GOAL #4   Title  Patient will have bilateral shoulder elevation to 150 degrees bilaterally to reach higher shelves    Time  4    Period  Weeks    Status   Partially Met        PT Long Term Goals - 09/17/17 1717      PT LONG TERM GOAL #1   Title  The patient will be independent in a safe self progression of HEP    Time  8    Period  Weeks    Status  New    Target Date  11/12/17      PT LONG TERM GOAL #2   Title  The patient will report a 60% reduction in pain with lying down/sleep    Time  8    Period  Weeks    Status  New      PT LONG TERM GOAL #3   Title  The patient will have improved cervical sidebending to 25 degrees bilaterally and rotation to 35 degrees bilaterally needed for ADLs    Time  8    Period  Weeks    Status  New      PT LONG TERM GOAL #4   Title  The patient will have bilateral UE ROM to 150 degrees needed for overhead reaching    Time  8    Period  Weeks    Status  New      PT LONG TERM GOAL #5   Title  FOTO functional outcome score improved from 55% limitation to 42% indicating improved function with less pain    Time  8    Period  [place] - 10/06/17 9937    Clinical Impression Statement  The patient is progressing very well and is able to participate in a progression of exercise without production of pain.  Improving cervical ROM and shoulder ROM.  Therapist closely monitoring response with all.  After discussing patient's progress, we decided to cancel her next appt and follow up in 1 week.  If she is still doing well at that time will discharge her to HEP.      Rehab Potential  Good    PT Frequency  2x / week    PT Duration  8 weeks    PT Treatment/Interventions  ADLs/Self Care Home Management;Electrical Stimulation;Ultrasound;Moist Heat;Therapeutic exercise;Therapeutic activities;Neuromuscular  re-education;Patient/family education;Taping;Dry needling    PT Next Visit Plan  Follow up in 1 week;  check progress toward goals;  check FOTO;  check cervical ROM;  discharge if still doing well       Patient will benefit from skilled therapeutic intervention in order to  improve the following deficits and impairments:  Pain, Increased fascial restricitons, Increased muscle spasms, Postural dysfunction, Decreased activity tolerance, Decreased range of motion, Decreased strength, Impaired UE functional use  Visit Diagnosis: Cervicalgia  Cramp and spasm     Problem List Patient Active Problem List   Diagnosis Date Noted  . GERD (gastroesophageal reflux disease) 06/16/2017  . Constipation 10/16/2016  . Obesity with serious comorbidity 10/16/2016  . Diverticulitis of large intestine with perforation 06/26/2016  . Anemia in other chronic diseases classified elsewhere 06/25/2016  . Chest pain with moderate risk for cardiac etiology 06/25/2016  . ESRD on hemodialysis ([place]) 06/30/2012  . Paroxysmal atrial fibrillation ([place]) 06/30/2012  . Hypertension with renal disease 06/30/2012   Ruben Im, PT 10/06/17 8:45 AM Phone: [phone] Fax: [phone]  Alvera Singh 10/06/2017, 8:44 AM  [facility] [address], [place], [place], [address] Phone: [phone]   Fax:  [phone]  Name: Jane [name] MRN: [id number] Date of Birth: Feb 24, 1946

## 2017-10-07 DIAGNOSIS — D631 Anemia in chronic kidney disease: Secondary | ICD-10-CM | POA: Diagnosis not present

## 2017-10-07 DIAGNOSIS — N186 End stage renal disease: Secondary | ICD-10-CM | POA: Diagnosis not present

## 2017-10-07 DIAGNOSIS — N2581 Secondary hyperparathyroidism of renal origin: Secondary | ICD-10-CM | POA: Diagnosis not present

## 2017-10-08 ENCOUNTER — Other Ambulatory Visit: Payer: Self-pay | Admitting: Family Medicine

## 2017-10-08 ENCOUNTER — Encounter: Payer: Medicare Other | Admitting: Physical Therapy

## 2017-10-08 DIAGNOSIS — I129 Hypertensive chronic kidney disease with stage 1 through stage 4 chronic kidney disease, or unspecified chronic kidney disease: Secondary | ICD-10-CM

## 2017-10-09 DIAGNOSIS — N2581 Secondary hyperparathyroidism of renal origin: Secondary | ICD-10-CM | POA: Diagnosis not present

## 2017-10-09 DIAGNOSIS — Z7901 Long term (current) use of anticoagulants: Secondary | ICD-10-CM | POA: Diagnosis not present

## 2017-10-09 DIAGNOSIS — D631 Anemia in chronic kidney disease: Secondary | ICD-10-CM | POA: Diagnosis not present

## 2017-10-09 DIAGNOSIS — N186 End stage renal disease: Secondary | ICD-10-CM | POA: Diagnosis not present

## 2017-10-12 DIAGNOSIS — N186 End stage renal disease: Secondary | ICD-10-CM | POA: Diagnosis not present

## 2017-10-12 DIAGNOSIS — D631 Anemia in chronic kidney disease: Secondary | ICD-10-CM | POA: Diagnosis not present

## 2017-10-12 DIAGNOSIS — N2581 Secondary hyperparathyroidism of renal origin: Secondary | ICD-10-CM | POA: Diagnosis not present

## 2017-10-13 ENCOUNTER — Encounter: Payer: Self-pay | Admitting: Physical Therapy

## 2017-10-13 ENCOUNTER — Ambulatory Visit: Payer: Medicare Other | Admitting: Physical Therapy

## 2017-10-13 DIAGNOSIS — R252 Cramp and spasm: Secondary | ICD-10-CM | POA: Diagnosis not present

## 2017-10-13 DIAGNOSIS — M542 Cervicalgia: Secondary | ICD-10-CM | POA: Diagnosis not present

## 2017-10-13 NOTE — Therapy (Signed)
[facility] [address] [place], [place], [address] Phone: [phone]   Fax:  [phone]  Physical Therapy Treatment  Patient Details  Name: Jane [name] MRN: [id number] Date of Birth: 04/02/1946 Referring Provider: Dr. Betty Jordan   Encounter Date: 10/13/2017  PT End of Session - 10/13/17 1018    Visit Number  7    Date for PT Re-Evaluation  11/12/17    Authorization Type  Medicare     PT Start Time  0803    PT Stop Time  0841    PT Time Calculation (min)  38 min    Activity Tolerance  Patient tolerated treatment well       Past Medical History:  Diagnosis Date  . Acid reflux   . Atrial fibrillation ([facility])   . Hypertension   . Renal disorder   . Sleep apnea     Past Surgical History:  Procedure Laterality Date  . IR GENERIC HISTORICAL  07/09/2016   IR US GUIDE VASC ACCESS RIGHT 07/09/2016 Daniel Hassell, MD [facility]  . IR GENERIC HISTORICAL  07/09/2016   IR FLUORO GUIDE CV LINE RIGHT 07/09/2016 Daniel Hassell, MD [facility]  . KNEE ARTHROPLASTY    . LAPAROSCOPIC SIGMOID COLECTOMY N/A 07/11/2016   Procedure: LAPAROSCOPIC SIGMOID COLECTOMY;  Surgeon: Chelsea A Connor, MD;  [facility];  Service: General;  Laterality: N/A;  . TUBAL LIGATION      There were no vitals filed for this visit.  Subjective Assessment - 10/13/17 0809    Subjective  I've had a set back.  My left shoulder blade hurts.   It even takes my breath and makes me hunch over.  I've been using the TENS and heat a lot.   "That's a different place from my neck."  My hands are stiff too.      Pertinent History  Back surgery 2016 fusion with continued injections; ESRD on dialysis, Htn;  A-fib;  sleep apnea    Currently in Pain?  No/denies    Pain Score  0-No pain    Pain Location  Scapula    Pain Orientation  Left    Pain Type  Chronic pain    Pain Relieving Factors  heat; TENS                 No data  recorded       [facility] Adult PT Treatment/Exercise - 10/13/17 0001      Self-Care   Self-Care  Heat/Ice Application;Other Self-Care Comments    Heat/Ice Application  continued use of home TENS    Other Self-Care Comments   small ball or pool noodle trigger point release      Neck Exercises: Seated   Other Seated Exercise  clasped hand rhomboid stretch    Other Seated Exercise  instruction in HEP and self care strategies      Shoulder Exercises: Seated   Other Seated Exercises  barrel stretch rotation      Moist Heat Therapy   Number Minutes Moist Heat  10 Minutes concurrent with ex    Moist Heat Location  Cervical      Manual Therapy   Soft tissue mobilization  left levator scap,rhomboids and upper trap muscles             PT Education - 10/13/17 0830    Education provided  Yes    Education Details  rhomboid/levator stretches    Person(s) Educated  Patient      Methods  Explanation;Demonstration;Handout    Comprehension  Verbalized understanding;Returned demonstration       PT Short Term Goals - 10/06/17 0840      PT SHORT TERM GOAL #1   Title  The patient will be able to perform a basic HEP for cervical ROM    Status  Achieved      PT SHORT TERM GOAL #2   Title  The patient will report a 30% improvement in pain with lying down and basic home ADLs    Status  Achieved      PT SHORT TERM GOAL #3   Title  The patient will have improved left sidbending to 20 degrees and left rotation to 30 degrees needed for personal care, grooming and dressing    Status  Partially Met      PT SHORT TERM GOAL #4   Title  Patient will have bilateral shoulder elevation to 150 degrees bilaterally to reach higher shelves    Time  4    Period  Weeks    Status  Partially Met        PT Long Term Goals - 09/17/17 1717      PT LONG TERM GOAL #1   Title  The patient will be independent in a safe self progression of HEP    Time  8    Period  Weeks    Status  New    Target Date   11/12/17      PT LONG TERM GOAL #2   Title  The patient will report a 60% reduction in pain with lying down/sleep    Time  8    Period  Weeks    Status  New      PT LONG TERM GOAL #3   Title  The patient will have improved cervical sidebending to 25 degrees bilaterally and rotation to 35 degrees bilaterally needed for ADLs    Time  8    Period  Weeks    Status  New      PT LONG TERM GOAL #4   Title  The patient will have bilateral UE ROM to 150 degrees needed for overhead reaching    Time  8    Period  Weeks    Status  New      PT LONG TERM GOAL #5   Title  FOTO functional outcome score improved from 55% limitation to 42% indicating improved function with less pain    Time  8    Period  Weeks    Status  New            Plan - 10/13/17 1010    Clinical Impression Statement  The patient reports a recent exacerbation of pain in a slightly different muscle region superior to scapula.  Good response to dry needling and manual therapy with  numerous tender points noted in left rhomboid, levator scap and upper trap muscles.  Improved muscle length by the end of treatment session.  Instructed in self care strategies and HEP to specifically to target these particular muscles.      Rehab Potential  Good    Clinical Impairments Affecting Rehab Potential  see co-morbidities above    PT Frequency  2x / week    PT Duration  8 weeks    PT Treatment/Interventions  ADLs/Self Care Home Management;Electrical Stimulation;Ultrasound;Moist Heat;Therapeutic exercise;Therapeutic activities;Neuromuscular re-education;Patient/family education;Taping;Dry needling    PT Next Visit Plan    check progress toward goals;  check FOTO;    check cervical ROM       Patient will benefit from skilled therapeutic intervention in order to improve the following deficits and impairments:  Pain, Increased fascial restricitons, Increased muscle spasms, Postural dysfunction, Decreased activity tolerance, Decreased range  of motion, Decreased strength, Impaired UE functional use  Visit Diagnosis: Cervicalgia  Cramp and spasm     Problem List Patient Active Problem List   Diagnosis Date Noted  . GERD (gastroesophageal reflux disease) 06/16/2017  . Constipation 10/16/2016  . Obesity with serious comorbidity 10/16/2016  . Diverticulitis of large intestine with perforation 06/26/2016  . Anemia in other chronic diseases classified elsewhere 06/25/2016  . Chest pain with moderate risk for cardiac etiology 06/25/2016  . ESRD on hemodialysis ([place]) 06/30/2012  . Paroxysmal atrial fibrillation ([place]) 06/30/2012  . Hypertension with renal disease 06/30/2012   Ruben Im, PT 10/13/17 10:24 AM Phone: [phone] Fax: [phone]  Alvera Singh 10/13/2017, 10:19 AM  [facility] [address], [place], [place], [address] Phone: [phone]   Fax:  [phone]  Name: Jane [name] MRN: [id number] Date of Birth: 1945/12/31

## 2017-10-13 NOTE — Patient Instructions (Signed)
Jane [name] PT [facility] [address], [address] , [place] [address] Phone # [phone] Fax [phone]    

## 2017-10-14 DIAGNOSIS — N2581 Secondary hyperparathyroidism of renal origin: Secondary | ICD-10-CM | POA: Diagnosis not present

## 2017-10-14 DIAGNOSIS — D631 Anemia in chronic kidney disease: Secondary | ICD-10-CM | POA: Diagnosis not present

## 2017-10-14 DIAGNOSIS — N186 End stage renal disease: Secondary | ICD-10-CM | POA: Diagnosis not present

## 2017-10-15 ENCOUNTER — Ambulatory Visit: Payer: Medicare Other | Admitting: Physical Therapy

## 2017-10-15 ENCOUNTER — Encounter: Payer: Self-pay | Admitting: Physical Therapy

## 2017-10-15 DIAGNOSIS — M542 Cervicalgia: Secondary | ICD-10-CM

## 2017-10-15 DIAGNOSIS — R252 Cramp and spasm: Secondary | ICD-10-CM

## 2017-10-15 NOTE — Therapy (Signed)
[facility] [address] [place], [place], [address] Phone: [phone]   Fax:  [phone]  Physical Therapy Treatment/Discharge Summary  Patient Details  Name: Jane [name] MRN: [id number] Date of Birth: 05-Nov-1945 Referring Provider: Dr. [name]   Encounter Date: 10/15/2017  PT End of Session - 10/15/17 1744    Visit Number  8    Date for PT Re-Evaluation  11/12/17    Authorization Type  Medicare     PT Start Time  0803    PT Stop Time  0833    PT Time Calculation (min)  30 min    Activity Tolerance  Patient tolerated treatment well       Past Medical History:  Diagnosis Date  . Acid reflux   . Atrial fibrillation ([place])   . Hypertension   . Renal disorder   . Sleep apnea     Past Surgical History:  Procedure Laterality Date  . IR GENERIC HISTORICAL  07/09/2016   IR US GUIDE VASC ACCESS RIGHT 07/09/2016 Arne Cleveland, MD [facility]  . IR GENERIC HISTORICAL  07/09/2016   IR FLUORO GUIDE CV LINE RIGHT 07/09/2016 Arne Cleveland, MD [facility]  . KNEE ARTHROPLASTY    . LAPAROSCOPIC SIGMOID COLECTOMY N/A 07/11/2016   Procedure: LAPAROSCOPIC SIGMOID COLECTOMY;  Surgeon: Clovis Riley, MD;  Location: [facility];  Service: General;  Laterality: N/A;  . TUBAL LIGATION      There were no vitals filed for this visit.  Subjective Assessment - 10/15/17 0806    Subjective  I feel like I'm back to my normal self.  The DN helped a lot.  I feel good.      Pertinent History  Back surgery 2016 fusion with continued injections; ESRD on dialysis, Htn;  A-fib;  sleep apnea    Currently in Pain?  No/denies    Pain Score  0-No pain    Pain Location  Neck         [facility] PT Assessment - 10/15/17 0001      Observation/Other Assessments   Focus on Therapeutic Outcomes (FOTO)   25%      AROM   Right/Left Shoulder  -- 160 bil flexion and abd    Cervical Flexion  50    Cervical Extension  65    Cervical -  Right Side Bend  35    Cervical - Left Side Bend  35    Cervical - Right Rotation  45    Cervical - Left Rotation  50      Strength   Overall Strength Comments  UE strength grossly 4/5     Cervical Flexion  4/5    Cervical Extension  4/5    Cervical - Right Side Bend  4/5    Cervical - Left Side Bend  4/5    Cervical - Right Rotation  4/5    Cervical - Left Rotation  4/5            No data recorded       [facility] Adult PT Treatment/Exercise - 10/15/17 0001      Self-Care   Self-Care  Posture    Posture  sitting postural correction    Other Self-Care Comments   discussion of self care strategies      Neck Exercises: Seated   Other Seated Exercise  extensive discussion discussion of HEP and safe self progression  PT Short Term Goals - 10/15/17 3546      PT SHORT TERM GOAL #1   Title  The patient will be able to perform a basic HEP for cervical ROM    Status  Achieved      PT SHORT TERM GOAL #2   Title  The patient will report a 30% improvement in pain with lying down and basic home ADLs    Status  Achieved      PT SHORT TERM GOAL #3   Title  The patient will have improved left sidbending to 20 degrees and left rotation to 30 degrees needed for personal care, grooming and dressing    Status  Achieved      PT SHORT TERM GOAL #4   Title  Patient will have bilateral shoulder elevation to 150 degrees bilaterally to reach higher shelves    Status  Achieved        PT Long Term Goals - 10/15/17 0807      PT LONG TERM GOAL #1   Title  The patient will be independent in a safe self progression of HEP    Status  Achieved      PT LONG TERM GOAL #2   Title  The patient will report a 60% reduction in pain with lying down/sleep    Status  Achieved      PT LONG TERM GOAL #3   Title  The patient will have improved cervical sidebending to 25 degrees bilaterally and rotation to 35 degrees bilaterally needed for ADLs    Status  Achieved      PT  LONG TERM GOAL #4   Title  The patient will have bilateral UE ROM to 150 degrees needed for overhead reaching    Status  Achieved      PT LONG TERM GOAL #5   Title  FOTO functional outcome score improved from 55% limitation to 42% indicating improved function with less pain    Status  Achieved            Plan - 10/15/17 1741    Clinical Impression Statement  The patient has made significant improvements in pain reduction, ROM, return to function and posture.  Initially she was very pain guarded and unable to move her head freely.  She responded very well to manual therapy, dry needling, exercise and modalities.  She is independent in a HEP and now has a home TENs unit if pain were to return.  She reports she is "back to normal."  She has met all rehab goals and is very happy with the result.  Will discharge from PT at this time.         Patient will benefit from skilled therapeutic intervention in order to improve the following deficits and impairments:     Visit Diagnosis: Cervicalgia  Cramp and spasm  PHYSICAL THERAPY DISCHARGE SUMMARY  Visits from Start of Care: 8  Current functional level related to goals / functional outcomes: See clinical impressions above   Remaining deficits: As above   Education / Equipment: HEP; home TENs unit Plan: Patient agrees to discharge.  Patient goals were met. Patient is being discharged due to meeting the stated rehab goals.  ?????           Problem List Patient Active Problem List   Diagnosis Date Noted  . GERD (gastroesophageal reflux disease) 06/16/2017  . Constipation 10/16/2016  . Obesity with serious comorbidity 10/16/2016  . Diverticulitis of large intestine with  perforation 06/26/2016  . Anemia in other chronic diseases classified elsewhere 06/25/2016  . Chest pain with moderate risk for cardiac etiology 06/25/2016  . ESRD on hemodialysis ([place]) 06/30/2012  . Paroxysmal atrial fibrillation ([place]) 06/30/2012  .  Hypertension with renal disease 06/30/2012   Ruben Im, PT 10/15/17 5:46 PM Phone: [phone] Fax: [phone]  Alvera Singh 10/15/2017, 5:45 PM  [facility] [address], [place], [place], [address] Phone: [phone]   Fax:  [phone]  Name: Jane [name] MRN: [id number] Date of Birth: 1946-07-07

## 2017-10-16 DIAGNOSIS — N186 End stage renal disease: Secondary | ICD-10-CM | POA: Diagnosis not present

## 2017-10-16 DIAGNOSIS — D631 Anemia in chronic kidney disease: Secondary | ICD-10-CM | POA: Diagnosis not present

## 2017-10-16 DIAGNOSIS — N2581 Secondary hyperparathyroidism of renal origin: Secondary | ICD-10-CM | POA: Diagnosis not present

## 2017-10-19 ENCOUNTER — Ambulatory Visit: Payer: Medicare Other | Admitting: Family Medicine

## 2017-10-19 ENCOUNTER — Encounter: Payer: Self-pay | Admitting: Family Medicine

## 2017-10-19 ENCOUNTER — Ambulatory Visit (INDEPENDENT_AMBULATORY_CARE_PROVIDER_SITE_OTHER): Payer: Medicare Other | Admitting: Family Medicine

## 2017-10-19 ENCOUNTER — Ambulatory Visit: Payer: Self-pay | Admitting: *Deleted

## 2017-10-19 VITALS — BP 155/70 | HR 98 | Temp 98.2°F | Resp 16 | Ht 65.0 in | Wt 244.2 lb

## 2017-10-19 DIAGNOSIS — Z992 Dependence on renal dialysis: Secondary | ICD-10-CM | POA: Diagnosis not present

## 2017-10-19 DIAGNOSIS — I129 Hypertensive chronic kidney disease with stage 1 through stage 4 chronic kidney disease, or unspecified chronic kidney disease: Secondary | ICD-10-CM | POA: Diagnosis not present

## 2017-10-19 DIAGNOSIS — I158 Other secondary hypertension: Secondary | ICD-10-CM | POA: Diagnosis not present

## 2017-10-19 DIAGNOSIS — M542 Cervicalgia: Secondary | ICD-10-CM | POA: Diagnosis not present

## 2017-10-19 DIAGNOSIS — D631 Anemia in chronic kidney disease: Secondary | ICD-10-CM | POA: Diagnosis not present

## 2017-10-19 DIAGNOSIS — R079 Chest pain, unspecified: Secondary | ICD-10-CM | POA: Diagnosis not present

## 2017-10-19 DIAGNOSIS — N2581 Secondary hyperparathyroidism of renal origin: Secondary | ICD-10-CM | POA: Diagnosis not present

## 2017-10-19 DIAGNOSIS — I480 Paroxysmal atrial fibrillation: Secondary | ICD-10-CM | POA: Diagnosis not present

## 2017-10-19 DIAGNOSIS — N186 End stage renal disease: Secondary | ICD-10-CM | POA: Diagnosis not present

## 2017-10-19 MED ORDER — AMLODIPINE BESYLATE 5 MG PO TABS: 5.0000 mg | ORAL_TABLET | Freq: Every day | ORAL | 3 refills | Status: DC

## 2017-10-19 NOTE — Telephone Encounter (Signed)
Pt scheduled to see Dr [name] @ 2:45pm.

## 2017-10-19 NOTE — Telephone Encounter (Signed)
Monitor for office arrival here today at 1:30 pm.

## 2017-10-19 NOTE — Patient Instructions (Addendum)
A few things to remember from today's visit:   Hypertension with renal disease - Plan: amLODipine (NORVASC) 5 MG tablet  Chest pain with moderate risk for cardiac etiology - Plan: EKG 12-Lead  Please schedule an appointment with your cardiologist, due to technical difficulties we could not do the EKG so we will cancel it.  Today I added amlodipine 5 mg, which you can take at bedtime. Continue Coreg and Cozaar. Continue monitoring your blood pressure at home.   Please be sure medication list is accurate. If a new problem present, please set up appointment sooner than planned today.

## 2017-10-19 NOTE — Progress Notes (Signed)
ACUTE VISIT   HPI:  Chief Complaint  Patient presents with  . Hypertension    [name] is a 72 y.o. female, who is here today complaining of elevated BP for the past 3 weeks.  She has history of hypertension, atrial fibrillation, ESRD on hemodialysis, chest pain, and GERD.  She is reporting headache, "mild chest pain" last night and feeling like she was "gasping for air."  Frontal and occipital headache, "pounding and pretty bad."  She has had this headache associated with neck pain in the past, she is doing PT, which has helped with neck pain. Intermittent, exacerbated by neck movement and palpation. She has not identified alleviating factors. She took 3 Tylenol today. Mild nausea "sometimes", no vomiting, no visual changes, no photophobia, no focal deficit, and no MS changes.  Last night she felt like her heart was "beating hard", left sided sharp pain, 3/10, no radiated, lasted about 8 minutes.  She was in bed when this happened, no associated diaphoresis. No exacerbating or alleviating factors identified.  Last appointment with her cardiologist was about 2 months ago.  She has not noted heartburn or abdominal pain. Occasional nonproductive cough. No associated wheezing or exertional dyspnea.  Currently she is on carvedilol 50 mg twice daily and Cozaar 100 mg daily. BP before dialysis 115/87 and 102/73.  BP after dialysis 196/81 and 175/78.    Review of Systems  Constitutional: Positive for fatigue (No more than usual.). Negative for activity change, appetite change and fever.  HENT: Negative for mouth sores, nosebleeds and trouble swallowing.   Eyes: Negative for redness and visual disturbance.  Respiratory: Positive for cough. Negative for shortness of breath and wheezing.   Cardiovascular: Negative for leg swelling.  Gastrointestinal: Negative for abdominal pain, nausea and vomiting.       Negative for changes in bowel habits.  Musculoskeletal: Positive  for neck pain. Negative for gait problem.  Allergic/Immunologic: Positive for environmental allergies.  Neurological: Positive for headaches. Negative for syncope and weakness.  Psychiatric/Behavioral: Negative for confusion. The patient is nervous/anxious.       Current Outpatient Medications on File Prior to Visit  Medication Sig Dispense Refill  . albuterol (PROVENTIL HFA;VENTOLIN HFA) 108 (90 Base) MCG/ACT inhaler Inhale 1 puff into the lungs every 6 (six) hours as needed for wheezing or shortness of breath.    . Amino Acids-Protein Hydrolys (FEEDING SUPPLEMENT, PRO-STAT SUGAR FREE 64,) LIQD Take 30 mLs by mouth 2 (two) times daily. 900 mL 0  . amiodarone (PACERONE) 200 MG tablet TAKE 1 TABLET BY MOUTH ONCE DAILY 90 tablet 1  . calcium carbonate (TUMS - DOSED IN MG ELEMENTAL CALCIUM) 500 MG chewable tablet Chew 2 tablets by mouth 3 (three) times daily with meals.    . carvedilol (COREG) 25 MG tablet TAKE 2 TABLETS BY MOUTH TWICE DAILY WITH A MEAL 360 tablet 2  . cinacalcet (SENSIPAR) 30 MG tablet Take 30 mg by mouth daily.    Marland Kitchen linaclotide (LINZESS) 72 MCG capsule Take 1 capsule (72 mcg total) by mouth daily before breakfast. 30 capsule 2  . losartan (COZAAR) 100 MG tablet Take 1 tablet (100 mg total) by mouth daily. 90 tablet 2  . multivitamin (RENA-VIT) TABS tablet Take 1 tablet by mouth daily.    Marland Kitchen omeprazole (PRILOSEC) 40 MG capsule Take 1 capsule (40 mg total) by mouth daily. 30 capsule 3  . sevelamer carbonate (RENVELA) 800 MG tablet Take 800-1,600 mg by mouth See admin instructions. [place]  mg after meals TWO times a day and 800 mg after each snack    . warfarin (COUMADIN) 5 MG tablet AS DIRECTED 50 tablet 3   No current facility-administered medications on file prior to visit.      Past Medical History:  Diagnosis Date  . Acid reflux   . Atrial fibrillation ([place])   . Hypertension   . Renal disorder   . Sleep apnea    Allergies  Allergen Reactions  . Penicillins Rash  .  Sulfa Antibiotics Rash  . Tape Other (See Comments)    PLASTIC TAPE TEARS OFF THE SKIN AND BRUISES IT TERRIBLY!! GETS ITCHY AND RASH FROM PAPER AND PLASTIC TAPES  . Latex Rash    Social History   Socioeconomic History  . Marital status: Single    Spouse name: Not on file  . Number of children: Not on file  . Years of education: Not on file  . Highest education level: Not on file  Occupational History  . Not on file  Social Needs  . Financial resource strain: Not on file  . Food insecurity:    Worry: Not on file    Inability: Not on file  . Transportation needs:    Medical: Not on file    Non-medical: Not on file  Tobacco Use  . Smoking status: Never Smoker  . Smokeless tobacco: Never Used  Substance and Sexual Activity  . Alcohol use: No  . Drug use: No  . Sexual activity: Never  Lifestyle  . Physical activity:    Days per week: Not on file    Minutes per session: Not on file  . Stress: Not on file  Relationships  . Social connections:    Talks on phone: Not on file    Gets together: Not on file    Attends religious service: Not on file    Active member of club or organization: Not on file    Attends meetings of clubs or organizations: Not on file    Relationship status: Not on file  Other Topics Concern  . Not on file  Social History Narrative  . Not on file    Vitals:   10/19/17 1435  BP: (!) 155/70  Pulse: 98  Resp: 16  Temp: 98.2 F (36.8 C)  SpO2: 98%   Body mass index is 40.65 kg/m.   Physical Exam  Nursing note and vitals reviewed. Constitutional: She is oriented to person, place, and time. She appears well-developed. No distress.  HENT:  Head: Normocephalic and atraumatic.  Mouth/Throat: Oropharynx is clear and moist and mucous membranes are normal.  Eyes: Pupils are equal, round, and reactive to light. Conjunctivae are normal.  Cardiovascular: Normal rate and regular rhythm.  Murmur (SEM I/VI RUSB-LUSM) heard. Pulses:      Dorsalis  pedis pulses are 2+ on the right side, and 2+ on the left side.  Respiratory: Effort normal and breath sounds normal. No respiratory distress. She exhibits no tenderness.  GI: Soft. She exhibits no mass. There is no hepatomegaly. There is no tenderness.  Musculoskeletal: She exhibits edema (Trace pitting LE edema,bilateral.).       Cervical back: She exhibits decreased range of motion (Mild) and tenderness.  Mild tenderness upon palpation of trapezium and paraspinal cervical muscles, bilateral.  Lymphadenopathy:    She has no cervical adenopathy.  Neurological: She is alert and oriented to person, place, and time. She has normal strength.  Stable gait, not assisted.  Skin: Skin is warm.  No rash noted. No erythema.  Psychiatric: Her mood appears anxious.  Well groomed, good eye contact.      ASSESSMENT AND PLAN:  Ms. Jane [name] was seen today for hypertension.  Diagnoses and all orders for this visit:  Hypertension with renal disease  Not well controlled. She agrees with adding Amlodipine 5 mg daily. No changes in rest of her medications. Continue low salt diet. Monitor BP regularly at home. Instructed about warning signs. Follow-up in 3-4 weeks.  -     amLODipine (NORVASC) 5 MG tablet; Take 1 tablet (5 mg total) by mouth daily.  Chest pain with moderate risk for cardiac etiology  Possible etiologies discussed, including cardiac, musculoskeletal, and GI. Today we could not complete EKG due to technical issues. She was instructed to arrange appointment with her cardiologist.  -     Cancel: EKG 12-Lead  Neck pain  This problem may be contributing to her headache, ? tension-like headache. Continue PT and Tylenol up to 3 g daily.  Paroxysmal atrial fibrillation ([facility])  Today she is in normal sinus rhythm. No changes in Coreg or Coumadin. Continue following with cardiologist. Instructed about warning signs.      Return in about 3 weeks (around 11/09/2017) for  HTN.     -[name] was advised to seek immediate medical attention if sudden worsening symptoms.     Lou Irigoyen G. [name], MD  [facility]. [place] office.

## 2017-10-19 NOTE — Assessment & Plan Note (Signed)
Not well controlled. Possible complications of elevated BP discussed. Amlodipine 5 mg added today. She will continue Cozaar 100 mg daily and Coreg 50 mg twice daily. Follow-up in 3-4 weeks.

## 2017-10-19 NOTE — Telephone Encounter (Signed)
Pt reports B/P trending higher past 3 weeks. States during night 215/80, this AM 210/80 after taking medications. States she has not missed any doses.  Pt is a HD patient; reports 215/87 prior to HD. States "took off more fluid than usual thinking that would help."  B/P after HD, Lying...215/87, Standing 196/81.  Reports headache 9/10 relieved with tylenol, last took at 1am. Also reports "mild" pain in left ear and neck; h/o cervicalgia. Denies chest pain. States SOB during night, slight this AM. Agent secured appt with Dr. Volanda Napoleon at 1330 today. Care advise given per protocol; instructed to call back if symptoms worsen.  Reason for Disposition . Systolic BP  >= 573 OR Diastolic >= 220  Answer Assessment - Initial Assessment Questions 1. BLOOD PRESSURE: "What is the blood pressure?" "Did you take at least two measurements 5 minutes apart?"     196/81 last taken in HD this AM 2. ONSET: "When did you take your blood pressure?"     In HD, approx. 10am 3. HOW: "How did you obtain the blood pressure?" (e.g., visiting nurse, automatic home BP monitor)     In HD 4. HISTORY: "Do you have a history of high blood pressure?"     Yes 5. MEDICATIONS: "Are you taking any medications for blood pressure?" "Have you missed any doses recently?"     Yes, No missed doses. 6. OTHER SYMPTOMS: "Do you have any symptoms?" (e.g., headache, chest pain, blurred vision, difficulty breathing, weakness)     H/A 9/10 at times, relieved with tylenol, last took at 1am. Denies CP but reports left ear and neck pain "mild"; H/o cervicalgia  Protocols used: HIGH BLOOD PRESSURE-A-AH

## 2017-10-20 DIAGNOSIS — Z992 Dependence on renal dialysis: Secondary | ICD-10-CM | POA: Diagnosis not present

## 2017-10-20 DIAGNOSIS — I480 Paroxysmal atrial fibrillation: Secondary | ICD-10-CM | POA: Diagnosis not present

## 2017-10-20 DIAGNOSIS — I10 Essential (primary) hypertension: Secondary | ICD-10-CM | POA: Diagnosis not present

## 2017-10-20 DIAGNOSIS — Z7901 Long term (current) use of anticoagulants: Secondary | ICD-10-CM | POA: Diagnosis not present

## 2017-10-20 DIAGNOSIS — N186 End stage renal disease: Secondary | ICD-10-CM | POA: Diagnosis not present

## 2017-10-21 DIAGNOSIS — D631 Anemia in chronic kidney disease: Secondary | ICD-10-CM | POA: Diagnosis not present

## 2017-10-21 DIAGNOSIS — N186 End stage renal disease: Secondary | ICD-10-CM | POA: Diagnosis not present

## 2017-10-21 DIAGNOSIS — N2581 Secondary hyperparathyroidism of renal origin: Secondary | ICD-10-CM | POA: Diagnosis not present

## 2017-10-23 DIAGNOSIS — N2581 Secondary hyperparathyroidism of renal origin: Secondary | ICD-10-CM | POA: Diagnosis not present

## 2017-10-23 DIAGNOSIS — D631 Anemia in chronic kidney disease: Secondary | ICD-10-CM | POA: Diagnosis not present

## 2017-10-23 DIAGNOSIS — N186 End stage renal disease: Secondary | ICD-10-CM | POA: Diagnosis not present

## 2017-10-26 DIAGNOSIS — D631 Anemia in chronic kidney disease: Secondary | ICD-10-CM | POA: Diagnosis not present

## 2017-10-26 DIAGNOSIS — N2581 Secondary hyperparathyroidism of renal origin: Secondary | ICD-10-CM | POA: Diagnosis not present

## 2017-10-26 DIAGNOSIS — N186 End stage renal disease: Secondary | ICD-10-CM | POA: Diagnosis not present

## 2017-10-28 DIAGNOSIS — N2581 Secondary hyperparathyroidism of renal origin: Secondary | ICD-10-CM | POA: Diagnosis not present

## 2017-10-28 DIAGNOSIS — D631 Anemia in chronic kidney disease: Secondary | ICD-10-CM | POA: Diagnosis not present

## 2017-10-28 DIAGNOSIS — N186 End stage renal disease: Secondary | ICD-10-CM | POA: Diagnosis not present

## 2017-10-29 DIAGNOSIS — I10 Essential (primary) hypertension: Secondary | ICD-10-CM | POA: Diagnosis not present

## 2017-10-29 DIAGNOSIS — Z992 Dependence on renal dialysis: Secondary | ICD-10-CM | POA: Diagnosis not present

## 2017-10-29 DIAGNOSIS — N186 End stage renal disease: Secondary | ICD-10-CM | POA: Diagnosis not present

## 2017-10-29 DIAGNOSIS — Z7901 Long term (current) use of anticoagulants: Secondary | ICD-10-CM | POA: Diagnosis not present

## 2017-10-29 DIAGNOSIS — I480 Paroxysmal atrial fibrillation: Secondary | ICD-10-CM | POA: Diagnosis not present

## 2017-10-30 DIAGNOSIS — N186 End stage renal disease: Secondary | ICD-10-CM | POA: Diagnosis not present

## 2017-10-30 DIAGNOSIS — D631 Anemia in chronic kidney disease: Secondary | ICD-10-CM | POA: Diagnosis not present

## 2017-10-30 DIAGNOSIS — N2581 Secondary hyperparathyroidism of renal origin: Secondary | ICD-10-CM | POA: Diagnosis not present

## 2017-11-02 DIAGNOSIS — D631 Anemia in chronic kidney disease: Secondary | ICD-10-CM | POA: Diagnosis not present

## 2017-11-02 DIAGNOSIS — N2581 Secondary hyperparathyroidism of renal origin: Secondary | ICD-10-CM | POA: Diagnosis not present

## 2017-11-02 DIAGNOSIS — N186 End stage renal disease: Secondary | ICD-10-CM | POA: Diagnosis not present

## 2017-11-04 DIAGNOSIS — N2581 Secondary hyperparathyroidism of renal origin: Secondary | ICD-10-CM | POA: Diagnosis not present

## 2017-11-04 DIAGNOSIS — N186 End stage renal disease: Secondary | ICD-10-CM | POA: Diagnosis not present

## 2017-11-04 DIAGNOSIS — D631 Anemia in chronic kidney disease: Secondary | ICD-10-CM | POA: Diagnosis not present

## 2017-11-06 DIAGNOSIS — N186 End stage renal disease: Secondary | ICD-10-CM | POA: Diagnosis not present

## 2017-11-06 DIAGNOSIS — D631 Anemia in chronic kidney disease: Secondary | ICD-10-CM | POA: Diagnosis not present

## 2017-11-06 DIAGNOSIS — N2581 Secondary hyperparathyroidism of renal origin: Secondary | ICD-10-CM | POA: Diagnosis not present

## 2017-11-09 DIAGNOSIS — D631 Anemia in chronic kidney disease: Secondary | ICD-10-CM | POA: Diagnosis not present

## 2017-11-09 DIAGNOSIS — N2581 Secondary hyperparathyroidism of renal origin: Secondary | ICD-10-CM | POA: Diagnosis not present

## 2017-11-09 DIAGNOSIS — N186 End stage renal disease: Secondary | ICD-10-CM | POA: Diagnosis not present

## 2017-11-09 NOTE — Progress Notes (Signed)
Jane [name] is a 72 y.o.female, who is here today to follow on HTN.  Currently sh carvedilol 50 mg twice daily e is on, Cozaar 100 mg daily, and amlodipine 5 mg daily.  The latter one was added last visit. ESRD on hemodialysis. Home BP: "Good." She has not needed IV fluids after hemodialysis. Headache has resolved.  She is taking medications as instructed, no side effects reported.  She has not noted visual changes, exertional chest pain, dyspnea,  focal weakness, or edema.  Last eye exam about 2 years ago.  Lab Results  Component Value Date   CREATININE 6.44 (H) 08/04/2016   BUN 28 (H) 08/04/2016   NA 139 08/04/2016   K 4.9 08/04/2016   CL 104 08/04/2016   CO2 22 08/04/2016   Atrial fibrillation: Currently she is on Coumadin, Mondays and Fridays 7.5 mg and rest of the week 5 mg. According the patient, her last INR was 2.6 and next follow-up is in 4 to 6 weeks. She follows at [facility]mad[facility].   Facial hair: She is requesting a prescription to help with decreasing facial hair. She usually waxes her facial hair but it comes "back the next day." She also has intermittent rash that sometimes is tender.  She has not noticed fever or chills.  Review of Systems  Constitutional: Negative for activity change, appetite change, fatigue and fever.  HENT: Negative for mouth sores and nosebleeds.   Eyes: Negative for redness and visual disturbance.  Respiratory: Negative for cough, shortness of breath and wheezing.   Cardiovascular: Negative for chest pain and palpitations.  Gastrointestinal: Negative for abdominal pain, nausea and vomiting.       Negative for changes in bowel habits.  Genitourinary: Negative for decreased urine volume and hematuria.  Skin: Positive for rash. Negative for wound.  Neurological: Negative for syncope, weakness and headaches.     Current Outpatient Medications on File Prior to Visit  Medication Sig Dispense Refill  . albuterol  (PROVENTIL HFA;VENTOLIN HFA) 108 (90 Base) MCG/ACT inhaler Inhale 1 puff into the lungs every 6 (six) hours as needed for wheezing or shortness of breath.    . Amino Acids-Protein Hydrolys (FEEDING SUPPLEMENT, PRO-STAT SUGAR FREE 64,) LIQD Take 30 mLs by mouth 2 (two) times daily. 900 mL 0  . amiodarone (PACERONE) 200 MG tablet TAKE 1 TABLET BY MOUTH ONCE DAILY 90 tablet 1  . amLODipine (NORVASC) 5 MG tablet Take 1 tablet (5 mg total) by mouth daily. 90 tablet 3  . calcium carbonate (TUMS - DOSED IN MG ELEMENTAL CALCIUM) 500 MG chewable tablet Chew 2 tablets by mouth 3 (three) times daily with meals.    . carvedilol (COREG) 25 MG tablet TAKE 2 TABLETS BY MOUTH TWICE DAILY WITH A MEAL 360 tablet 2  . cinacalcet (SENSIPAR) 30 MG tablet Take 30 mg by mouth daily.    Marland Kitchen linaclotide (LINZESS) 72 MCG capsule Take 1 capsule (72 mcg total) by mouth daily before breakfast. 30 capsule 2  . losartan (COZAAR) 100 MG tablet Take 1 tablet (100 mg total) by mouth daily. 90 tablet 2  . multivitamin (RENA-VIT) TABS tablet Take 1 tablet by mouth daily.    Marland Kitchen omeprazole (PRILOSEC) 40 MG capsule Take 1 capsule (40 mg total) by mouth daily. 30 capsule 3  . sevelamer carbonate (RENVELA) 800 MG tablet Take 800-1,600 mg by mouth See admin instructions. 1,600 mg after meals TWO times a day and 800 mg after each snack    .  warfarin (COUMADIN) 5 MG tablet AS DIRECTED 50 tablet 3   No current facility-administered medications on file prior to visit.      Past Medical History:  Diagnosis Date  . Acid reflux   . Atrial fibrillation ([place])   . Hypertension   . Renal disorder   . Sleep apnea     Allergies  Allergen Reactions  . Penicillins Rash  . Sulfa Antibiotics Rash  . Tape Other (See Comments)    PLASTIC TAPE TEARS OFF THE SKIN AND BRUISES IT TERRIBLY!! GETS ITCHY AND RASH FROM PAPER AND PLASTIC TAPES  . Latex Rash    Social History   Socioeconomic History  . Marital status: Single    Spouse name: Not on  file  . Number of children: Not on file  . Years of education: Not on file  . Highest education level: Not on file  Occupational History  . Not on file  Social Needs  . Financial resource strain: Not on file  . Food insecurity:    Worry: Not on file    Inability: Not on file  . Transportation needs:    Medical: Not on file    Non-medical: Not on file  Tobacco Use  . Smoking status: Never Smoker  . Smokeless tobacco: Never Used  Substance and Sexual Activity  . Alcohol use: No  . Drug use: No  . Sexual activity: Never  Lifestyle  . Physical activity:    Days per week: Not on file    Minutes per session: Not on file  . Stress: Not on file  Relationships  . Social connections:    Talks on phone: Not on file    Gets together: Not on file    Attends religious service: Not on file    Active member of club or organization: Not on file    Attends meetings of clubs or organizations: Not on file    Relationship status: Not on file  Other Topics Concern  . Not on file  Social History Narrative  . Not on file    Vitals:   11/10/17 0727  BP: 126/82  Pulse: 83  Resp: 16  Temp: 98.3 F (36.8 C)  SpO2: 96%   Body mass index is 40.13 kg/m.   Physical Exam  Nursing note and vitals reviewed. Constitutional: She is oriented to person, place, and time. She appears well-developed. No distress.  HENT:  Head: Normocephalic and atraumatic.  Mouth/Throat: Oropharynx is clear and moist and mucous membranes are normal.  Eyes: Pupils are equal, round, and reactive to light. Conjunctivae are normal.  Cardiovascular: Normal rate and regular rhythm.  Murmur (Soft SEM RUSB) heard. Pulses:      Dorsalis pedis pulses are 2+ on the right side, and 2+ on the left side.  Varicose veins in lower extremities.  Respiratory: Effort normal and breath sounds normal. No respiratory distress.  Musculoskeletal: She exhibits no edema.  Lymphadenopathy:    She has no cervical adenopathy.    Neurological: She is alert and oriented to person, place, and time. She has normal strength.  Stable gait without assistance.  Skin: Skin is warm. No purpura noted. Rash is not vesicular. No erythema.  Papular, nonerythematous lesions on chin and around hair follicles.  Mild amount of thin hair noted.  Psychiatric: She has a normal mood and affect.  Well groomed, good eye contact.    ASSESSMENT AND PLAN:   Ms. Tristian was seen today for follow-up.  Diagnoses and all orders  for this visit:  -     Eflornithine HCl 13.9 % cream; Apply 1 application topically 2 (two) times daily. At least 8 hours apart.  Hypertension with renal disease BP adequately controlled. Continue low-salt diet. No changes in current management. Instructed to continue monitoring BP at home. She is overdue for eye exam, recommend arrange an appointment with her eye care provider. Follow-up in 4 months.  Abnormal facial hair Treatment options discussed.  She would like topical cream, she understands her health insurance will not cover it, voices understanding. Follow-up as needed.  Chronic anticoagulation INR at goal 2 weeks ago. She will continue following with the Coumadin clinic at [facility].     -Ms. Ronda Maultsby advised to return sooner than planned today if new concerns arise.     Janeil Schexnayder G. [name], MD  [facility]. [place] office.

## 2017-11-10 ENCOUNTER — Encounter: Payer: Self-pay | Admitting: Family Medicine

## 2017-11-10 ENCOUNTER — Ambulatory Visit (INDEPENDENT_AMBULATORY_CARE_PROVIDER_SITE_OTHER): Payer: Medicare Other | Admitting: Family Medicine

## 2017-11-10 VITALS — BP 126/82 | HR 83 | Temp 98.3°F | Resp 16 | Ht 65.0 in | Wt 241.1 lb

## 2017-11-10 DIAGNOSIS — I129 Hypertensive chronic kidney disease with stage 1 through stage 4 chronic kidney disease, or unspecified chronic kidney disease: Secondary | ICD-10-CM

## 2017-11-10 DIAGNOSIS — L678 Other hair color and hair shaft abnormalities: Secondary | ICD-10-CM | POA: Diagnosis not present

## 2017-11-10 DIAGNOSIS — Z7901 Long term (current) use of anticoagulants: Secondary | ICD-10-CM | POA: Diagnosis not present

## 2017-11-10 MED ORDER — EFLORNITHINE HCL 13.9 % EX CREA: 1.0000 "application " | TOPICAL_CREAM | Freq: Two times a day (BID) | CUTANEOUS | 1 refills | Status: DC

## 2017-11-10 NOTE — Assessment & Plan Note (Signed)
BP adequately controlled. Continue low-salt diet. No changes in current management. Instructed to continue monitoring BP at home. She is overdue for eye exam, recommend arrange an appointment with her eye care provider. Follow-up in 4 months.

## 2017-11-10 NOTE — Assessment & Plan Note (Signed)
Treatment options discussed.  She would like topical cream, she understands her health insurance will not cover it, voices understanding. Follow-up as needed.

## 2017-11-10 NOTE — Patient Instructions (Signed)
A few things to remember from today's visit:   Hypertension with renal disease  Abnormal facial hair - Plan: Eflornithine HCl 13.9 % cream  No changes in blood pressure medication today. Continue monitoring blood pressure at home. A facial cream was sent to your pharmacy, apply every 12 hours.    Please be sure medication list is accurate. If a new problem present, please set up appointment sooner than planned today.

## 2017-11-10 NOTE — Assessment & Plan Note (Signed)
INR at goal 2 weeks ago. She will continue following with the Coumadin clinic at [facility].

## 2017-11-11 DIAGNOSIS — E1129 Type 2 diabetes mellitus with other diabetic kidney complication: Secondary | ICD-10-CM | POA: Diagnosis not present

## 2017-11-11 DIAGNOSIS — N186 End stage renal disease: Secondary | ICD-10-CM | POA: Diagnosis not present

## 2017-11-11 DIAGNOSIS — N2581 Secondary hyperparathyroidism of renal origin: Secondary | ICD-10-CM | POA: Diagnosis not present

## 2017-11-11 DIAGNOSIS — D631 Anemia in chronic kidney disease: Secondary | ICD-10-CM | POA: Diagnosis not present

## 2017-11-13 ENCOUNTER — Telehealth: Payer: Self-pay | Admitting: *Deleted

## 2017-11-13 DIAGNOSIS — N2581 Secondary hyperparathyroidism of renal origin: Secondary | ICD-10-CM | POA: Diagnosis not present

## 2017-11-13 DIAGNOSIS — D631 Anemia in chronic kidney disease: Secondary | ICD-10-CM | POA: Diagnosis not present

## 2017-11-13 DIAGNOSIS — N186 End stage renal disease: Secondary | ICD-10-CM | POA: Diagnosis not present

## 2017-11-13 NOTE — Telephone Encounter (Signed)
Prior auth sent to [email or web address].

## 2017-11-13 NOTE — Telephone Encounter (Signed)
Prior Authorization needed for Vaniqa 13.9% Cream, apply 1 application topically 2 times at least 8 hours apart.

## 2017-11-16 DIAGNOSIS — N186 End stage renal disease: Secondary | ICD-10-CM | POA: Diagnosis not present

## 2017-11-16 DIAGNOSIS — N2581 Secondary hyperparathyroidism of renal origin: Secondary | ICD-10-CM | POA: Diagnosis not present

## 2017-11-16 DIAGNOSIS — D631 Anemia in chronic kidney disease: Secondary | ICD-10-CM | POA: Diagnosis not present

## 2017-11-18 DIAGNOSIS — N2581 Secondary hyperparathyroidism of renal origin: Secondary | ICD-10-CM | POA: Diagnosis not present

## 2017-11-18 DIAGNOSIS — A499 Bacterial infection, unspecified: Secondary | ICD-10-CM | POA: Diagnosis not present

## 2017-11-18 DIAGNOSIS — N186 End stage renal disease: Secondary | ICD-10-CM | POA: Diagnosis not present

## 2017-11-18 DIAGNOSIS — Z992 Dependence on renal dialysis: Secondary | ICD-10-CM | POA: Diagnosis not present

## 2017-11-18 DIAGNOSIS — I158 Other secondary hypertension: Secondary | ICD-10-CM | POA: Diagnosis not present

## 2017-11-20 DIAGNOSIS — A499 Bacterial infection, unspecified: Secondary | ICD-10-CM | POA: Diagnosis not present

## 2017-11-20 DIAGNOSIS — N186 End stage renal disease: Secondary | ICD-10-CM | POA: Diagnosis not present

## 2017-11-20 DIAGNOSIS — N2581 Secondary hyperparathyroidism of renal origin: Secondary | ICD-10-CM | POA: Diagnosis not present

## 2017-11-23 DIAGNOSIS — A499 Bacterial infection, unspecified: Secondary | ICD-10-CM | POA: Diagnosis not present

## 2017-11-23 DIAGNOSIS — N2581 Secondary hyperparathyroidism of renal origin: Secondary | ICD-10-CM | POA: Diagnosis not present

## 2017-11-23 DIAGNOSIS — N186 End stage renal disease: Secondary | ICD-10-CM | POA: Diagnosis not present

## 2017-11-24 DIAGNOSIS — R5381 Other malaise: Secondary | ICD-10-CM | POA: Diagnosis not present

## 2017-11-24 DIAGNOSIS — I480 Paroxysmal atrial fibrillation: Secondary | ICD-10-CM | POA: Diagnosis not present

## 2017-11-24 DIAGNOSIS — I10 Essential (primary) hypertension: Secondary | ICD-10-CM | POA: Diagnosis not present

## 2017-11-25 DIAGNOSIS — A499 Bacterial infection, unspecified: Secondary | ICD-10-CM | POA: Diagnosis not present

## 2017-11-25 DIAGNOSIS — N2581 Secondary hyperparathyroidism of renal origin: Secondary | ICD-10-CM | POA: Diagnosis not present

## 2017-11-25 DIAGNOSIS — N186 End stage renal disease: Secondary | ICD-10-CM | POA: Diagnosis not present

## 2017-11-27 DIAGNOSIS — I10 Essential (primary) hypertension: Secondary | ICD-10-CM | POA: Diagnosis not present

## 2017-11-27 DIAGNOSIS — N2581 Secondary hyperparathyroidism of renal origin: Secondary | ICD-10-CM | POA: Diagnosis not present

## 2017-11-27 DIAGNOSIS — A499 Bacterial infection, unspecified: Secondary | ICD-10-CM | POA: Diagnosis not present

## 2017-11-27 DIAGNOSIS — Z992 Dependence on renal dialysis: Secondary | ICD-10-CM | POA: Diagnosis not present

## 2017-11-27 DIAGNOSIS — N186 End stage renal disease: Secondary | ICD-10-CM | POA: Diagnosis not present

## 2017-11-27 DIAGNOSIS — I480 Paroxysmal atrial fibrillation: Secondary | ICD-10-CM | POA: Diagnosis not present

## 2017-11-27 DIAGNOSIS — R5381 Other malaise: Secondary | ICD-10-CM | POA: Diagnosis not present

## 2017-11-30 DIAGNOSIS — N2581 Secondary hyperparathyroidism of renal origin: Secondary | ICD-10-CM | POA: Diagnosis not present

## 2017-11-30 DIAGNOSIS — N186 End stage renal disease: Secondary | ICD-10-CM | POA: Diagnosis not present

## 2017-11-30 DIAGNOSIS — A499 Bacterial infection, unspecified: Secondary | ICD-10-CM | POA: Diagnosis not present

## 2017-12-02 DIAGNOSIS — N2581 Secondary hyperparathyroidism of renal origin: Secondary | ICD-10-CM | POA: Diagnosis not present

## 2017-12-02 DIAGNOSIS — N186 End stage renal disease: Secondary | ICD-10-CM | POA: Diagnosis not present

## 2017-12-02 DIAGNOSIS — A499 Bacterial infection, unspecified: Secondary | ICD-10-CM | POA: Diagnosis not present

## 2017-12-02 NOTE — Telephone Encounter (Signed)
Chart opened in error

## 2017-12-04 DIAGNOSIS — A499 Bacterial infection, unspecified: Secondary | ICD-10-CM | POA: Diagnosis not present

## 2017-12-04 DIAGNOSIS — N2581 Secondary hyperparathyroidism of renal origin: Secondary | ICD-10-CM | POA: Diagnosis not present

## 2017-12-04 DIAGNOSIS — N186 End stage renal disease: Secondary | ICD-10-CM | POA: Diagnosis not present

## 2017-12-07 DIAGNOSIS — A499 Bacterial infection, unspecified: Secondary | ICD-10-CM | POA: Diagnosis not present

## 2017-12-07 DIAGNOSIS — N2581 Secondary hyperparathyroidism of renal origin: Secondary | ICD-10-CM | POA: Diagnosis not present

## 2017-12-07 DIAGNOSIS — N186 End stage renal disease: Secondary | ICD-10-CM | POA: Diagnosis not present

## 2017-12-09 DIAGNOSIS — N2581 Secondary hyperparathyroidism of renal origin: Secondary | ICD-10-CM | POA: Diagnosis not present

## 2017-12-09 DIAGNOSIS — N186 End stage renal disease: Secondary | ICD-10-CM | POA: Diagnosis not present

## 2017-12-09 DIAGNOSIS — A499 Bacterial infection, unspecified: Secondary | ICD-10-CM | POA: Diagnosis not present

## 2017-12-11 DIAGNOSIS — N2581 Secondary hyperparathyroidism of renal origin: Secondary | ICD-10-CM | POA: Diagnosis not present

## 2017-12-11 DIAGNOSIS — N186 End stage renal disease: Secondary | ICD-10-CM | POA: Diagnosis not present

## 2017-12-11 DIAGNOSIS — A499 Bacterial infection, unspecified: Secondary | ICD-10-CM | POA: Diagnosis not present

## 2017-12-14 DIAGNOSIS — N186 End stage renal disease: Secondary | ICD-10-CM | POA: Diagnosis not present

## 2017-12-14 DIAGNOSIS — N2581 Secondary hyperparathyroidism of renal origin: Secondary | ICD-10-CM | POA: Diagnosis not present

## 2017-12-14 DIAGNOSIS — A499 Bacterial infection, unspecified: Secondary | ICD-10-CM | POA: Diagnosis not present

## 2017-12-16 DIAGNOSIS — N2581 Secondary hyperparathyroidism of renal origin: Secondary | ICD-10-CM | POA: Diagnosis not present

## 2017-12-16 DIAGNOSIS — A499 Bacterial infection, unspecified: Secondary | ICD-10-CM | POA: Diagnosis not present

## 2017-12-16 DIAGNOSIS — N186 End stage renal disease: Secondary | ICD-10-CM | POA: Diagnosis not present

## 2017-12-18 DIAGNOSIS — N186 End stage renal disease: Secondary | ICD-10-CM | POA: Diagnosis not present

## 2017-12-18 DIAGNOSIS — N2581 Secondary hyperparathyroidism of renal origin: Secondary | ICD-10-CM | POA: Diagnosis not present

## 2017-12-18 DIAGNOSIS — A499 Bacterial infection, unspecified: Secondary | ICD-10-CM | POA: Diagnosis not present

## 2017-12-19 DIAGNOSIS — I158 Other secondary hypertension: Secondary | ICD-10-CM | POA: Diagnosis not present

## 2017-12-19 DIAGNOSIS — Z992 Dependence on renal dialysis: Secondary | ICD-10-CM | POA: Diagnosis not present

## 2017-12-19 DIAGNOSIS — N186 End stage renal disease: Secondary | ICD-10-CM | POA: Diagnosis not present

## 2017-12-21 DIAGNOSIS — N2581 Secondary hyperparathyroidism of renal origin: Secondary | ICD-10-CM | POA: Diagnosis not present

## 2017-12-21 DIAGNOSIS — N186 End stage renal disease: Secondary | ICD-10-CM | POA: Diagnosis not present

## 2017-12-21 DIAGNOSIS — D631 Anemia in chronic kidney disease: Secondary | ICD-10-CM | POA: Diagnosis not present

## 2017-12-23 DIAGNOSIS — N2581 Secondary hyperparathyroidism of renal origin: Secondary | ICD-10-CM | POA: Diagnosis not present

## 2017-12-23 DIAGNOSIS — D631 Anemia in chronic kidney disease: Secondary | ICD-10-CM | POA: Diagnosis not present

## 2017-12-23 DIAGNOSIS — N186 End stage renal disease: Secondary | ICD-10-CM | POA: Diagnosis not present

## 2017-12-25 DIAGNOSIS — N2581 Secondary hyperparathyroidism of renal origin: Secondary | ICD-10-CM | POA: Diagnosis not present

## 2017-12-25 DIAGNOSIS — D631 Anemia in chronic kidney disease: Secondary | ICD-10-CM | POA: Diagnosis not present

## 2017-12-25 DIAGNOSIS — N186 End stage renal disease: Secondary | ICD-10-CM | POA: Diagnosis not present

## 2017-12-28 DIAGNOSIS — D631 Anemia in chronic kidney disease: Secondary | ICD-10-CM | POA: Diagnosis not present

## 2017-12-28 DIAGNOSIS — N186 End stage renal disease: Secondary | ICD-10-CM | POA: Diagnosis not present

## 2017-12-28 DIAGNOSIS — N2581 Secondary hyperparathyroidism of renal origin: Secondary | ICD-10-CM | POA: Diagnosis not present

## 2017-12-28 DIAGNOSIS — Z7901 Long term (current) use of anticoagulants: Secondary | ICD-10-CM | POA: Diagnosis not present

## 2017-12-29 IMAGING — XA IR FLUORO GUIDE CV LINE*R*
1 series · 1 of 1 positions shown · non-contrast
Comparison: none

CLINICAL DATA: Renal failure, poor peripheral venous access, needs
durable venous access for antibiotic regimen.

EXAM:
RIGHT IJ CENTRAL VENOUS CATHETER PLACEMENT UNDER ULTRASOUND AND
FLUOROSCOPIC GUIDANCE
FLUOROSCOPY TIME:  0.5 minute (15 uWymO DAP)
TECHNIQUE: The procedure, risks (including but not limited to bleeding,
infection, organ damage ), benefits, and alternatives were explained
to the patient. Questions regarding the procedure were encouraged
and answered. The patient understands and consents to the procedure.
Patency of the right IJ vein was confirmed with ultrasound with
image documentation. An appropriate skin site was determined. Skin
site was marked. Region was prepped using maximum barrier technique
including cap and mask, sterile gown, sterile gloves, large sterile
sheet, and Chlorhexidine as cutaneous antisepsis. The region was
infiltrated locally with 1% lidocaine. Under real-time ultrasound
guidance, the right IJ vein was accessed with a 21 gauge
micropuncture needle; the needle tip within the vein was confirmed
with ultrasound image documentation. The needle exchanged over a
guidewire for peel-away

[Series 1: fl (-) angio · 1 of 1 slices shown]
[im 1/1]
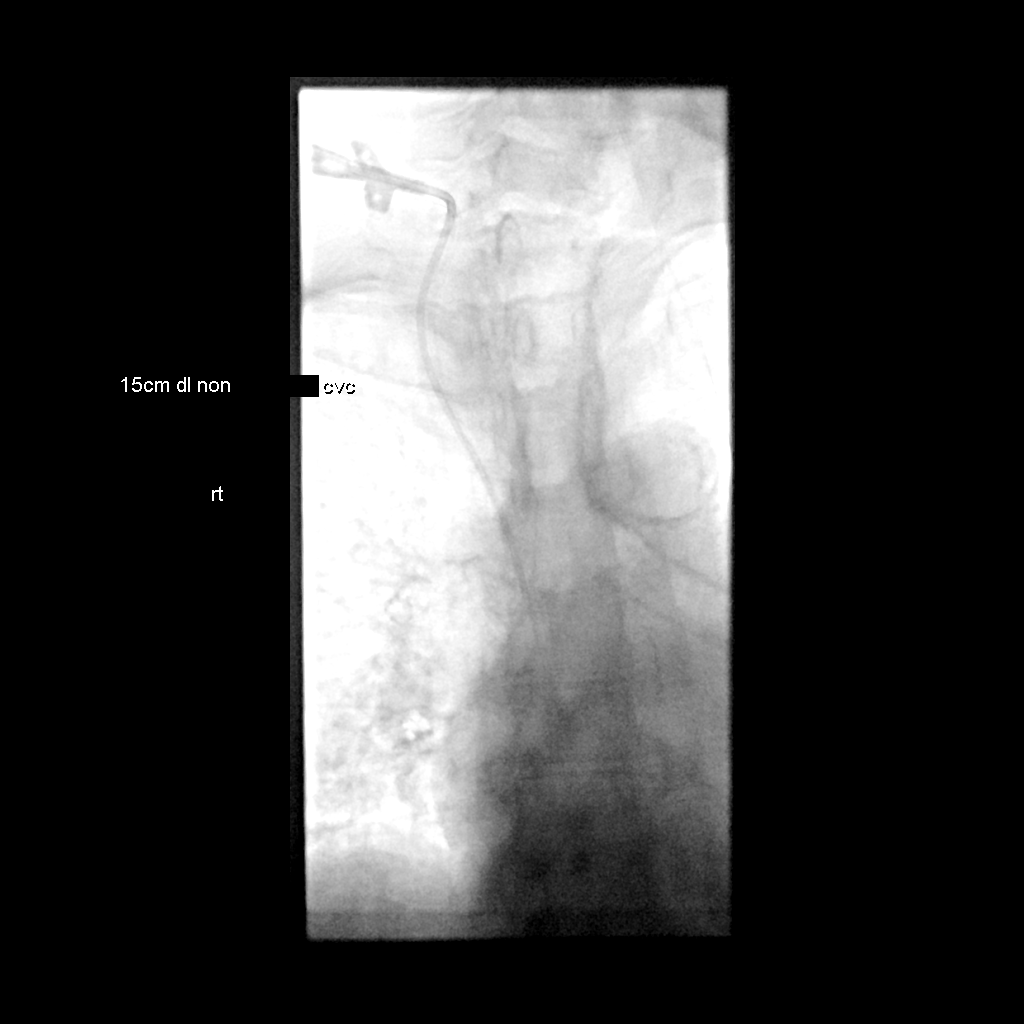

[1 of 1 positions shown; findings below may reference images not displayed]

sheath through which an 15cm 5 French PowerPICC dual lumen catheter
was

advanced. This was positioned with the tip at the cavoatrial
junction. Spot

chest radiograph shows good positioning and no pneumothorax.
Catheter was flushed

and sutured externally with 0-Prolene sutures. Patient tolerated the
procedure well.

COMPLICATIONS:
None immediate
IMPRESSION: 1. Technically successful right IJ double lumen power injectable
central venous catheter placement.

## 2017-12-30 DIAGNOSIS — N2581 Secondary hyperparathyroidism of renal origin: Secondary | ICD-10-CM | POA: Diagnosis not present

## 2017-12-30 DIAGNOSIS — N186 End stage renal disease: Secondary | ICD-10-CM | POA: Diagnosis not present

## 2017-12-30 DIAGNOSIS — D631 Anemia in chronic kidney disease: Secondary | ICD-10-CM | POA: Diagnosis not present

## 2018-01-01 ENCOUNTER — Other Ambulatory Visit: Payer: Self-pay | Admitting: Family Medicine

## 2018-01-01 DIAGNOSIS — N186 End stage renal disease: Secondary | ICD-10-CM | POA: Diagnosis not present

## 2018-01-01 DIAGNOSIS — D631 Anemia in chronic kidney disease: Secondary | ICD-10-CM | POA: Diagnosis not present

## 2018-01-01 DIAGNOSIS — N2581 Secondary hyperparathyroidism of renal origin: Secondary | ICD-10-CM | POA: Diagnosis not present

## 2018-01-01 DIAGNOSIS — I129 Hypertensive chronic kidney disease with stage 1 through stage 4 chronic kidney disease, or unspecified chronic kidney disease: Secondary | ICD-10-CM

## 2018-01-03 IMAGING — CR DG ABDOMEN 2V
2 series · 2 of 2 positions shown · non-contrast
Comparison: CT abdomen and pelvis 07/09/2016

CLINICAL DATA: Lower abdominal pain with nausea and vomiting.
Recent sigmoid colectomy.

EXAM:
ABDOMEN - 2 VIEW

[abdomen erect]
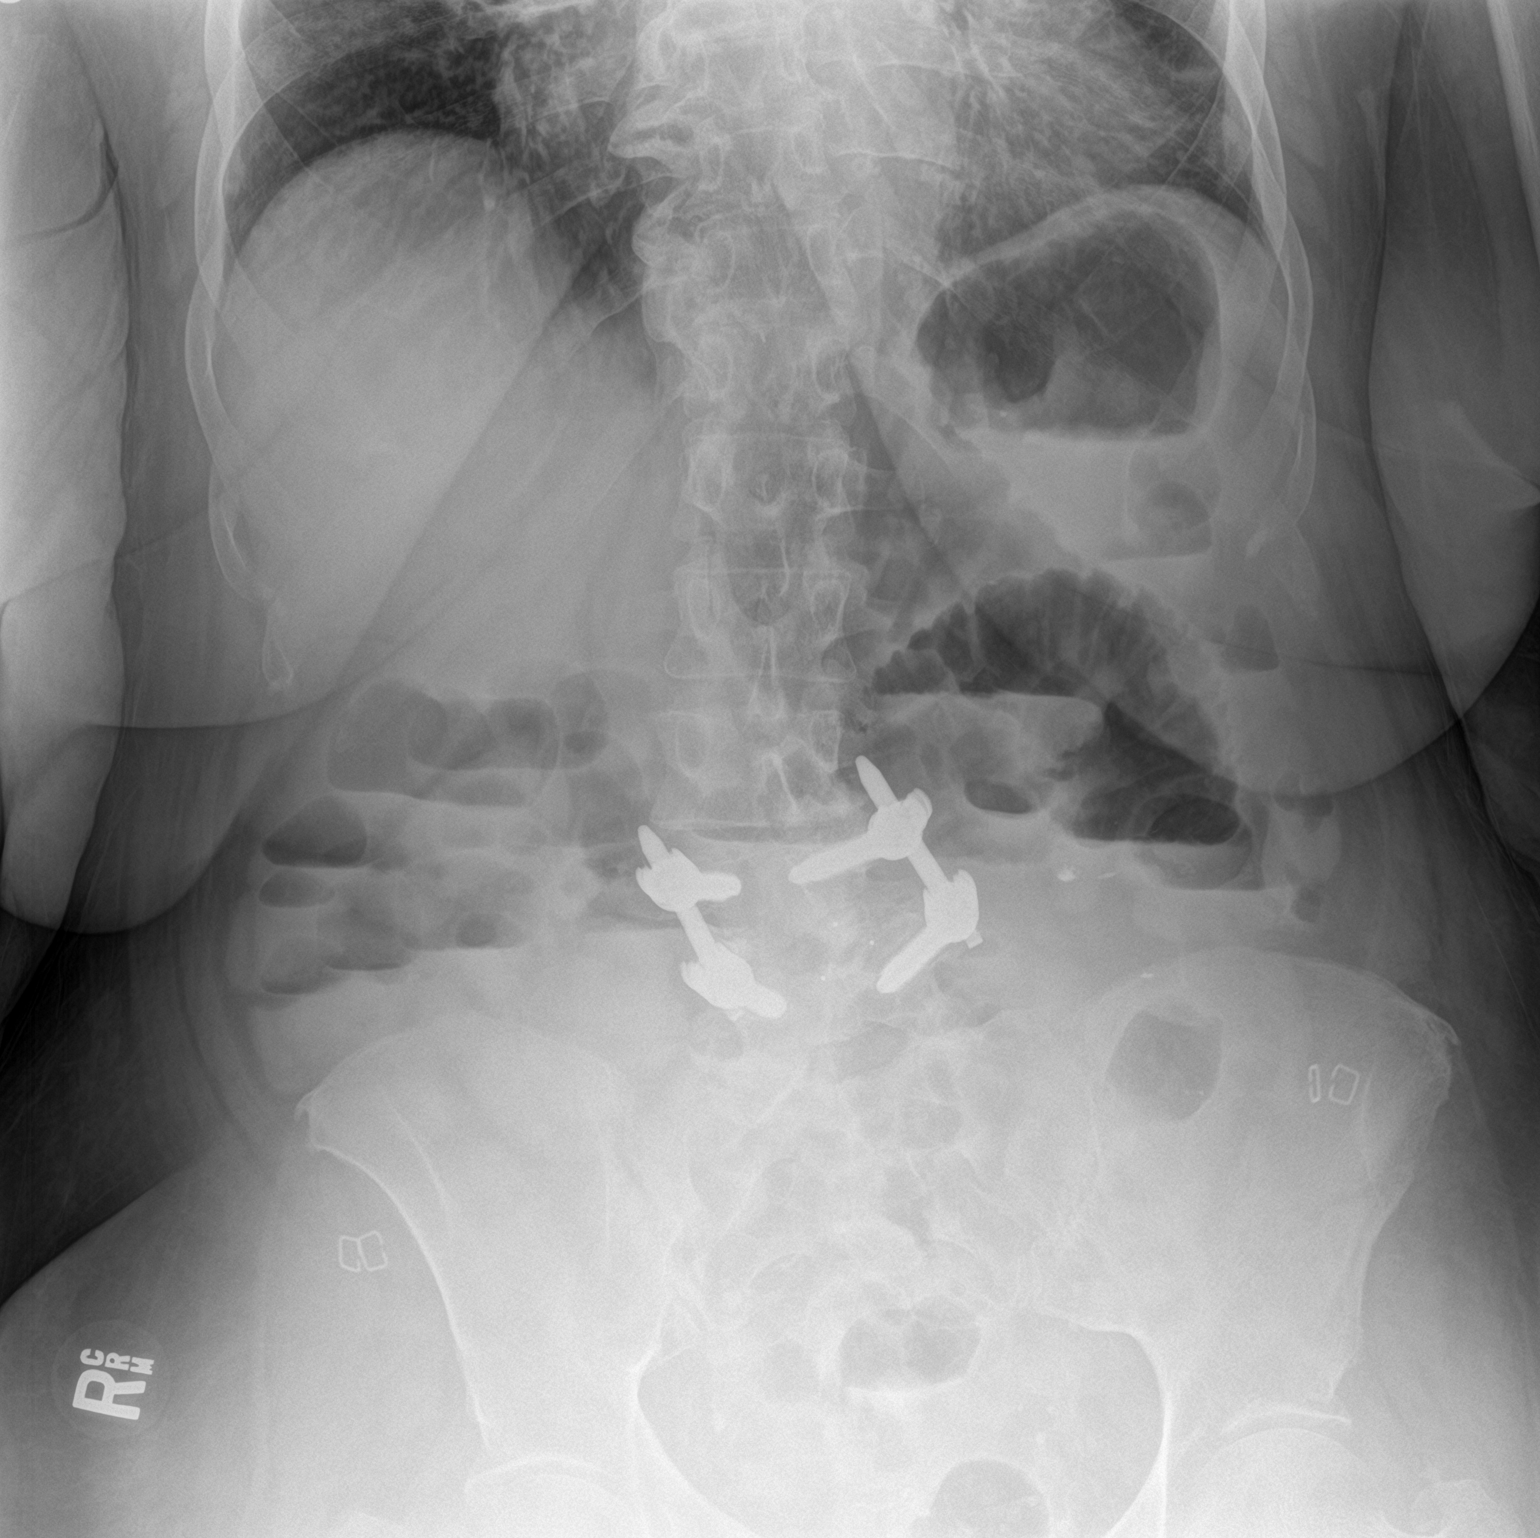

[abdomen supine]
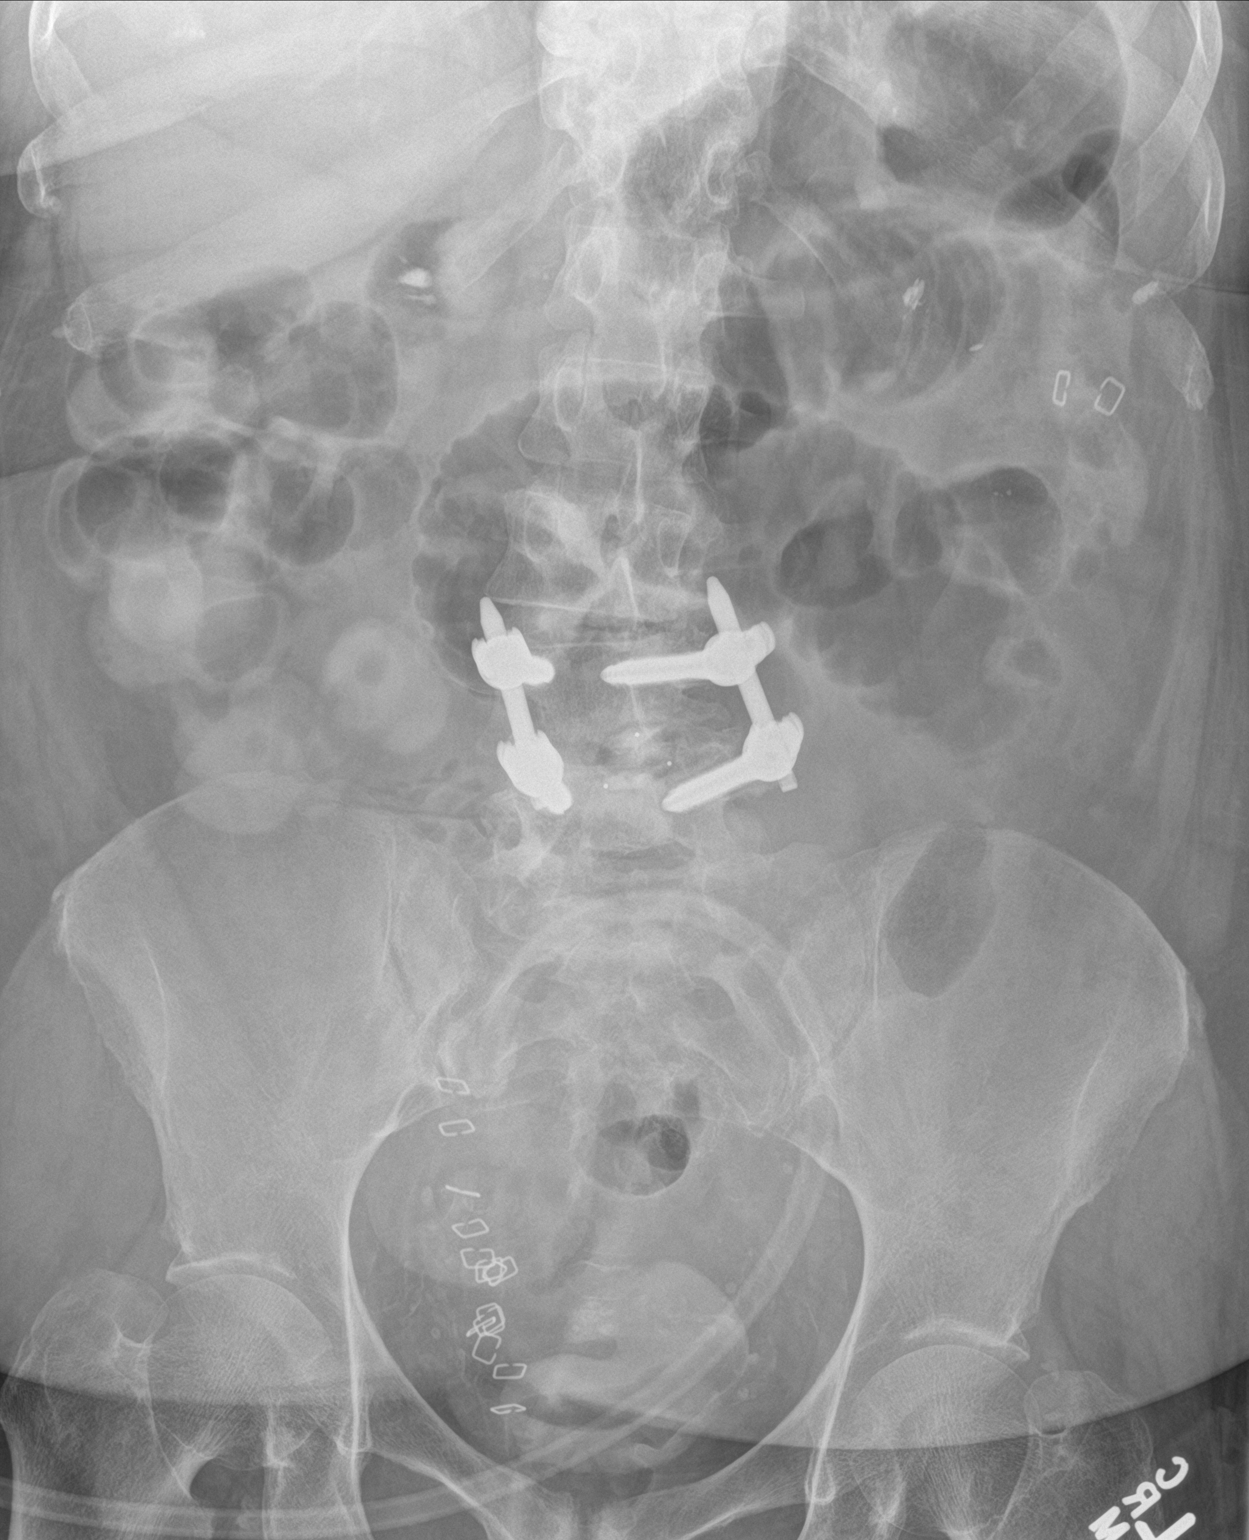

[2 of 2 positions shown; findings below may reference images not displayed]

FINDINGS: Gas is present in multiple loops of mildly to moderately dilated
small bowel in the left and right mid abdomen with multiple small
air-fluid levels. The residual oral contrast is present in the
proximal colon. A small amount of gas is present throughout the
colon. There is also contrast in the rectum. No definite
intraperitoneal free air. Skin staples are noted over the lower
abdomen. Prior lumbar fusion.
IMPRESSION: Mild-to-moderate small bowel dilatation with small volume gas and
contrast in the colon. Findings may reflect postoperative ileus,
although partial small bowel obstruction is not excluded.

## 2018-01-04 DIAGNOSIS — N2581 Secondary hyperparathyroidism of renal origin: Secondary | ICD-10-CM | POA: Diagnosis not present

## 2018-01-04 DIAGNOSIS — D631 Anemia in chronic kidney disease: Secondary | ICD-10-CM | POA: Diagnosis not present

## 2018-01-04 DIAGNOSIS — N186 End stage renal disease: Secondary | ICD-10-CM | POA: Diagnosis not present

## 2018-01-06 DIAGNOSIS — N186 End stage renal disease: Secondary | ICD-10-CM | POA: Diagnosis not present

## 2018-01-06 DIAGNOSIS — N2581 Secondary hyperparathyroidism of renal origin: Secondary | ICD-10-CM | POA: Diagnosis not present

## 2018-01-06 DIAGNOSIS — D631 Anemia in chronic kidney disease: Secondary | ICD-10-CM | POA: Diagnosis not present

## 2018-01-08 DIAGNOSIS — D631 Anemia in chronic kidney disease: Secondary | ICD-10-CM | POA: Diagnosis not present

## 2018-01-08 DIAGNOSIS — N2581 Secondary hyperparathyroidism of renal origin: Secondary | ICD-10-CM | POA: Diagnosis not present

## 2018-01-08 DIAGNOSIS — N186 End stage renal disease: Secondary | ICD-10-CM | POA: Diagnosis not present

## 2018-01-11 DIAGNOSIS — N2581 Secondary hyperparathyroidism of renal origin: Secondary | ICD-10-CM | POA: Diagnosis not present

## 2018-01-11 DIAGNOSIS — N186 End stage renal disease: Secondary | ICD-10-CM | POA: Diagnosis not present

## 2018-01-11 DIAGNOSIS — D631 Anemia in chronic kidney disease: Secondary | ICD-10-CM | POA: Diagnosis not present

## 2018-01-12 DIAGNOSIS — Z7901 Long term (current) use of anticoagulants: Secondary | ICD-10-CM | POA: Diagnosis not present

## 2018-01-13 ENCOUNTER — Other Ambulatory Visit: Payer: Self-pay | Admitting: Family Medicine

## 2018-01-13 DIAGNOSIS — N2581 Secondary hyperparathyroidism of renal origin: Secondary | ICD-10-CM | POA: Diagnosis not present

## 2018-01-13 DIAGNOSIS — K5900 Constipation, unspecified: Secondary | ICD-10-CM

## 2018-01-13 DIAGNOSIS — N186 End stage renal disease: Secondary | ICD-10-CM | POA: Diagnosis not present

## 2018-01-13 DIAGNOSIS — I480 Paroxysmal atrial fibrillation: Secondary | ICD-10-CM

## 2018-01-13 DIAGNOSIS — D631 Anemia in chronic kidney disease: Secondary | ICD-10-CM | POA: Diagnosis not present

## 2018-01-15 DIAGNOSIS — N2581 Secondary hyperparathyroidism of renal origin: Secondary | ICD-10-CM | POA: Diagnosis not present

## 2018-01-15 DIAGNOSIS — N186 End stage renal disease: Secondary | ICD-10-CM | POA: Diagnosis not present

## 2018-01-15 DIAGNOSIS — D631 Anemia in chronic kidney disease: Secondary | ICD-10-CM | POA: Diagnosis not present

## 2018-01-18 DIAGNOSIS — N186 End stage renal disease: Secondary | ICD-10-CM | POA: Diagnosis not present

## 2018-01-18 DIAGNOSIS — N2581 Secondary hyperparathyroidism of renal origin: Secondary | ICD-10-CM | POA: Diagnosis not present

## 2018-01-18 DIAGNOSIS — D631 Anemia in chronic kidney disease: Secondary | ICD-10-CM | POA: Diagnosis not present

## 2018-01-18 DIAGNOSIS — I158 Other secondary hypertension: Secondary | ICD-10-CM | POA: Diagnosis not present

## 2018-01-18 DIAGNOSIS — Z992 Dependence on renal dialysis: Secondary | ICD-10-CM | POA: Diagnosis not present

## 2018-01-20 DIAGNOSIS — N186 End stage renal disease: Secondary | ICD-10-CM | POA: Diagnosis not present

## 2018-01-20 DIAGNOSIS — D631 Anemia in chronic kidney disease: Secondary | ICD-10-CM | POA: Diagnosis not present

## 2018-01-20 DIAGNOSIS — N2581 Secondary hyperparathyroidism of renal origin: Secondary | ICD-10-CM | POA: Diagnosis not present

## 2018-01-22 DIAGNOSIS — N2581 Secondary hyperparathyroidism of renal origin: Secondary | ICD-10-CM | POA: Diagnosis not present

## 2018-01-22 DIAGNOSIS — N186 End stage renal disease: Secondary | ICD-10-CM | POA: Diagnosis not present

## 2018-01-22 DIAGNOSIS — D631 Anemia in chronic kidney disease: Secondary | ICD-10-CM | POA: Diagnosis not present

## 2018-01-25 ENCOUNTER — Ambulatory Visit: Payer: Self-pay

## 2018-01-25 DIAGNOSIS — N2581 Secondary hyperparathyroidism of renal origin: Secondary | ICD-10-CM | POA: Diagnosis not present

## 2018-01-25 DIAGNOSIS — N186 End stage renal disease: Secondary | ICD-10-CM | POA: Diagnosis not present

## 2018-01-25 DIAGNOSIS — D631 Anemia in chronic kidney disease: Secondary | ICD-10-CM | POA: Diagnosis not present

## 2018-01-25 NOTE — Telephone Encounter (Signed)
Noted  

## 2018-01-25 NOTE — Telephone Encounter (Signed)
Pt. Reports she has had a cough x 2 weeks and "it is just getting worse and won't go away." Productive cough with yellow phlegm.Has some wheezing and runny nose. No fever. "Using my asthma inhaler some. " Appointment made for tomorrow. Instructed to go to ED if shortness of breath worsens - verbalizes understanding.  Reason for Disposition . [1] Continuous (nonstop) coughing interferes with work or school AND [2] no improvement using cough treatment per Care Advice  Answer Assessment - Initial Assessment Questions 1. ONSET: "When did the cough begin?"      2 weeks ago 2. SEVERITY: "How bad is the cough today?"      Severe 3. RESPIRATORY DISTRESS: "Describe your breathing."      Some shortness of breath 4. FEVER: "Do you have a fever?" If so, ask: "What is your temperature, how was it measured, and when did it start?"     No 5. SPUTUM: "Describe the color of your sputum" (clear, white, yellow, green)     Yellow 6. HEMOPTYSIS: "Are you coughing up any blood?" If so ask: "How much?" (flecks, streaks, tablespoons, etc.)     No 7. CARDIAC HISTORY: "Do you have any history of heart disease?" (e.g., heart attack, congestive heart failure)      A-fib 8. LUNG HISTORY: "Do you have any history of lung disease?"  (e.g., pulmonary embolus, asthma, emphysema)     Asthma and allergy 9. PE RISK FACTORS: "Do you have a history of blood clots?" (or: recent major surgery, recent prolonged travel, bedridden)     No 10. OTHER SYMPTOMS: "Do you have any other symptoms?" (e.g., runny nose, wheezing, chest pain)       Wheezing 11. PREGNANCY: "Is there any chance you are pregnant?" "When was your last menstrual period?"       No 12. TRAVEL: "Have you traveled out of the country in the last month?" (e.g., travel history, exposures)       No  Protocols used: [place]

## 2018-01-26 ENCOUNTER — Ambulatory Visit (INDEPENDENT_AMBULATORY_CARE_PROVIDER_SITE_OTHER): Payer: Medicare Other | Admitting: Family Medicine

## 2018-01-26 ENCOUNTER — Telehealth: Payer: Self-pay | Admitting: Family Medicine

## 2018-01-26 ENCOUNTER — Ambulatory Visit (INDEPENDENT_AMBULATORY_CARE_PROVIDER_SITE_OTHER)
Admission: RE | Admit: 2018-01-26 | Discharge: 2018-01-26 | Disposition: A | Discharge status: Home / Self Care | Payer: Medicare Other | Source: Ambulatory Visit | Attending: Family Medicine | Admitting: Family Medicine

## 2018-01-26 ENCOUNTER — Encounter: Payer: Self-pay | Admitting: Family Medicine

## 2018-01-26 ENCOUNTER — Other Ambulatory Visit: Payer: Self-pay | Admitting: *Deleted

## 2018-01-26 VITALS — BP 130/84 | HR 86 | Temp 98.3°F | Resp 16 | Ht 65.0 in | Wt 246.0 lb

## 2018-01-26 DIAGNOSIS — J4521 Mild intermittent asthma with (acute) exacerbation: Secondary | ICD-10-CM

## 2018-01-26 DIAGNOSIS — R0789 Other chest pain: Secondary | ICD-10-CM

## 2018-01-26 DIAGNOSIS — R059 Cough, unspecified: Secondary | ICD-10-CM

## 2018-01-26 DIAGNOSIS — R05 Cough: Secondary | ICD-10-CM

## 2018-01-26 DIAGNOSIS — R0602 Shortness of breath: Secondary | ICD-10-CM | POA: Diagnosis not present

## 2018-01-26 LAB — CBC WITH DIFFERENTIAL/PLATELET
BASOS PCT: 0.5 % (ref 0.0–3.0)
Basophils Absolute: 0 10*3/uL (ref 0.0–0.1)
EOS PCT: 9 % — AB (ref 0.0–5.0)
Eosinophils Absolute: 0.4 10*3/uL (ref 0.0–0.7)
HCT: 32.9 % — ABNORMAL LOW (ref 36.0–46.0)
Hemoglobin: 10.9 g/dL — ABNORMAL LOW (ref 12.0–15.0)
LYMPHS ABS: 0.8 10*3/uL (ref 0.7–4.0)
Lymphocytes Relative: 19.2 % (ref 12.0–46.0)
MCHC: 33.1 g/dL (ref 30.0–36.0)
MCV: 88 fl (ref 78.0–100.0)
MONO ABS: 0.6 10*3/uL (ref 0.1–1.0)
MONOS PCT: 12.7 % — AB (ref 3.0–12.0)
NEUTROS PCT: 58.6 % (ref 43.0–77.0)
Neutro Abs: 2.6 10*3/uL (ref 1.4–7.7)
Platelets: 149 10*3/uL — ABNORMAL LOW (ref 150.0–400.0)
RBC: 3.74 Mil/uL — AB (ref 3.87–5.11)
RDW: 17.8 % — AB (ref 11.5–15.5)
WBC: 4.4 10*3/uL (ref 4.0–10.5)

## 2018-01-26 MED ORDER — BENZONATATE 100 MG PO CAPS: 200.0000 mg | ORAL_CAPSULE | Freq: Two times a day (BID) | ORAL | 0 refills | Status: AC | PRN

## 2018-01-26 MED ORDER — ALBUTEROL SULFATE HFA 108 (90 BASE) MCG/ACT IN AERS: 1.0000 | INHALATION_SPRAY | Freq: Four times a day (QID) | RESPIRATORY_TRACT | 2 refills | Status: DC | PRN

## 2018-01-26 MED ORDER — HYDROCODONE-HOMATROPINE 5-1.5 MG/5ML PO SYRP: 5.0000 mL | ORAL_SOLUTION | Freq: Every evening | ORAL | 0 refills | Status: AC | PRN

## 2018-01-26 NOTE — Patient Instructions (Addendum)
A few things to remember from today's visit:   Mild intermittent reactive airway disease with acute exacerbation  Cough - Plan: CBC with Differential/Platelet, DG Chest 2 View  Albuterol inh 2 puff every 6 hours for a week then as needed for wheezing or shortness of breath.  Today X ray was ordered.  This can be done at [facility] between 8 am and 5 pm: [place]. [phone].  Monitor for worsening symptoms and fever.   Please be sure medication list is accurate. If a new problem present, please set up appointment sooner than planned today.

## 2018-01-26 NOTE — Telephone Encounter (Signed)
Albuterol inhaler refill Last Refilled by historical provider Last OV: 01/26/18 PCP: Dr. [name] Pharmacy:[facility] [phone] Barbara Cower

## 2018-01-26 NOTE — Telephone Encounter (Signed)
Copied from [place] [id number]. Topic: Quick Communication - Rx Refill/Question >> Jan 26, 2018  1:42 PM Bea Graff, NT wrote: Medication: albuterol (PROVENTIL HFA;VENTOLIN HFA) 108 (90 Base) MCG/ACT inhaler   Has the patient contacted their pharmacy? Yes.   (Agent: If no, request that the patient contact the pharmacy for the refill.) (Agent: If yes, when and what did the pharmacy advise?)  Preferred Pharmacy (with phone number or street name): [place], [place] - [address] [phone] (Phone) [phone] (Fax)      Agent: Please be advised that RX refills may take up to 3 business days. We ask that you follow-up with your pharmacy.

## 2018-01-26 NOTE — Progress Notes (Signed)
ACUTE VISIT  HPI:  Chief Complaint  Patient presents with  . Sinusitis    x 2 weeks  . Cough    x 2 weeks, coughing so much that it is causing chest discomfort    [name] is a 72 y.o.female here today complaining of 2 weeks of respiratory symptoms. Productive cough, initially yellowish sputum and now clear. Reported history of asthma, she is using albuterol inhaler, last used yesterday.  Sore throat exacerbated by coughing. SOB with coughing spells. Nasal congestion and post nasal drainage. Denies facial pain or headache.  Symptoms seem to be exacerbated by cold air at the dialysis center, [facility].   Cough  This is a new problem. The current episode started 1 to 4 weeks ago. The problem has been unchanged. The cough is productive of sputum. Associated symptoms include postnasal drip, rhinorrhea, a sore throat, shortness of breath and wheezing. Pertinent negatives include no chills, ear congestion, ear pain, eye redness, fever, headaches, heartburn, hemoptysis, myalgias or rash. The symptoms are aggravated by cold air. She has tried a beta-agonist inhaler for the symptoms. Her past medical history is significant for asthma and environmental allergies.   Concerned about right-sided chest pain when coughing.  No Fever,chills, and/or body aches.   No Hx of recent travel. No sick contact.  Sharp right sided chest pain upon coughing for the past few days.  OTC medications for this problem: None  Symptoms otherwise stable.   Review of Systems  Constitutional: Positive for fatigue. Negative for activity change, appetite change, chills and fever.  HENT: Positive for congestion, postnasal drip, rhinorrhea and sore throat. Negative for ear pain, mouth sores, sinus pressure and voice change.   Eyes: Negative for discharge, redness and itching.  Respiratory: Positive for cough, shortness of breath and wheezing. Negative for hemoptysis.   Gastrointestinal: Negative for  abdominal pain, diarrhea, heartburn, nausea and vomiting.  Musculoskeletal: Negative for gait problem and myalgias.  Skin: Negative for rash.  Allergic/Immunologic: Positive for environmental allergies.  Neurological: Negative for syncope, weakness and headaches.  Psychiatric/Behavioral: Negative for confusion. The patient is nervous/anxious.       Current Outpatient Medications on File Prior to Visit  Medication Sig Dispense Refill  . Amino Acids-Protein Hydrolys (FEEDING SUPPLEMENT, PRO-STAT SUGAR FREE 64,) LIQD Take 30 mLs by mouth 2 (two) times daily. 900 mL 0  . amiodarone (PACERONE) 200 MG tablet TAKE 1 TABLET BY MOUTH ONCE DAILY 90 tablet 1  . calcium carbonate (TUMS - DOSED IN MG ELEMENTAL CALCIUM) 500 MG chewable tablet Chew 2 tablets by mouth 3 (three) times daily with meals.    . carvedilol (COREG) 25 MG tablet TAKE 2 TABLETS BY MOUTH TWICE DAILY WITH A MEAL 360 tablet 2  . cinacalcet (SENSIPAR) 30 MG tablet Take 30 mg by mouth daily.    . Eflornithine HCl 13.9 % cream Apply 1 application topically 2 (two) times daily. At least 8 hours apart. 45 g 1  . ethyl chloride spray USE 1 TO 2 SPRAYS TO DIALYSIS ACCESS EVERY TREATMENT  12  . LINZESS 72 MCG capsule TAKE 1 CAPSULE BY MOUTH ONCE DAILY BEFORE BREAKFAST 90 capsule 1  . losartan (COZAAR) 100 MG tablet Take 1 tablet (100 mg total) by mouth daily. 90 tablet 2  . multivitamin (RENA-VIT) TABS tablet Take 1 tablet by mouth daily.    . sevelamer carbonate (RENVELA) 800 MG tablet Take 800-1,600 mg by mouth See admin instructions. 1,600 mg after meals TWO  times a day and 800 mg after each snack    . warfarin (COUMADIN) 5 MG tablet TAKE AS DIRECTED 50 tablet 3   No current facility-administered medications on file prior to visit.      Past Medical History:  Diagnosis Date  . Acid reflux   . Atrial fibrillation ([place])   . Hypertension   . Renal disorder   . Sleep apnea    Allergies  Allergen Reactions  . Latex Rash  . Other  Other (See Comments)    The plastic tape irritates her skin.  Silicone tapes cause redness  . Penicillins Rash  . Sulfa Antibiotics Rash  . Tape Other (See Comments)    PLASTIC TAPE TEARS OFF THE SKIN AND BRUISES IT TERRIBLY!! GETS ITCHY AND RASH FROM PAPER AND PLASTIC TAPES    Social History   Socioeconomic History  . Marital status: Single    Spouse name: Not on file  . Number of children: Not on file  . Years of education: Not on file  . Highest education level: Not on file  Occupational History  . Not on file  Social Needs  . Financial resource strain: Not on file  . Food insecurity:    Worry: Not on file    Inability: Not on file  . Transportation needs:    Medical: Not on file    Non-medical: Not on file  Tobacco Use  . Smoking status: Never Smoker  . Smokeless tobacco: Never Used  Substance and Sexual Activity  . Alcohol use: No  . Drug use: No  . Sexual activity: Never  Lifestyle  . Physical activity:    Days per week: Not on file    Minutes per session: Not on file  . Stress: Not on file  Relationships  . Social connections:    Talks on phone: Not on file    Gets together: Not on file    Attends religious service: Not on file    Active member of club or organization: Not on file    Attends meetings of clubs or organizations: Not on file    Relationship status: Not on file  Other Topics Concern  . Not on file  Social History Narrative  . Not on file    Vitals:   01/26/18 0855  BP: 130/84  Pulse: 86  Resp: 16  Temp: 98.3 F (36.8 C)  SpO2: 98%   Body mass index is 40.94 kg/m.    Physical Exam  Nursing note and vitals reviewed. Constitutional: She is oriented to person, place, and time. She appears well-developed. She does not appear ill. No distress.  HENT:  Head: Normocephalic and atraumatic.  Right Ear: Tympanic membrane, external ear and ear canal normal.  Left Ear: Tympanic membrane, external ear and ear canal normal.  Nose: Right  sinus exhibits no maxillary sinus tenderness and no frontal sinus tenderness. Left sinus exhibits no maxillary sinus tenderness and no frontal sinus tenderness.  Mouth/Throat: Oropharynx is clear and moist and mucous membranes are normal.  Hypertrophic turbinates.  Eyes: Conjunctivae are normal.  Cardiovascular: Normal rate and regular rhythm.  Murmur (Soft SEM RUSB) heard. Respiratory: Effort normal and breath sounds normal. No stridor. No respiratory distress. She exhibits tenderness (right costochondral).  Lymphadenopathy:       Head (right side): No submandibular adenopathy present.       Head (left side): No submandibular adenopathy present.    She has no cervical adenopathy.  Neurological: She is alert and oriented  to person, place, and time. She has normal strength.  Skin: Skin is warm. No rash noted. No erythema.  Psychiatric: Her mood appears anxious.  Well groomed, good eye contact.     ASSESSMENT AND PLAN:  Ms. Jane [name] was seen today for sinusitis and cough.  Diagnoses and all orders for this visit:  Lab Results  Component Value Date   WBC 4.4 01/26/2018   HGB 10.9 (L) 01/26/2018   HCT 32.9 (L) 01/26/2018   MCV 88.0 01/26/2018   PLT 149.0 (L) 01/26/2018    Mild intermittent reactive airway disease with acute exacerbation  No wheezing or rales on auscultation today. Albuterol inh 2 puff every 6 hours for a week then as needed for wheezing or shortness of breath.  I do not think Prednisone is needed for now.  Cough  We discussed possible causes. Residual from recent URI,asthma,allergies. She also has Hx of GERD. For now symptomatic treatment recommended. Some side effects of Hydrocodone discussed. Adequate hydration.   -     CBC with Differential/Platelet -     DG Chest 2 View; Future -     HYDROcodone-homatropine (HYCODAN) 5-1.5 MG/5ML syrup; Take 5 mLs by mouth at bedtime as needed for up to 10 days for cough. -     benzonatate (TESSALON) 100 MG capsule;  Take 2 capsules (200 mg total) by mouth 2 (two) times daily as needed for up to 10 days. Am and noon  Chest wall pain  Hx suggest musculoskeletal chest pain, reassured.  Instructed about warning signs.   -Ms. Jane [name] was advised to seek attention immediately if symptoms worsen or to follow if they persist or new concerns arise.       [name]. [name], MD  [facility]. [place] office.

## 2018-01-26 NOTE — Telephone Encounter (Signed)
Rx sent to pharmacy as requested.

## 2018-01-27 ENCOUNTER — Telehealth: Payer: Self-pay | Admitting: *Deleted

## 2018-01-27 DIAGNOSIS — D631 Anemia in chronic kidney disease: Secondary | ICD-10-CM | POA: Diagnosis not present

## 2018-01-27 DIAGNOSIS — N2581 Secondary hyperparathyroidism of renal origin: Secondary | ICD-10-CM | POA: Diagnosis not present

## 2018-01-27 DIAGNOSIS — N186 End stage renal disease: Secondary | ICD-10-CM | POA: Diagnosis not present

## 2018-01-27 NOTE — Telephone Encounter (Signed)
Copied from [place] [id number]. Topic: Quick Communication - Other Results >> Jan 27, 2018 10:25 AM Synthia Innocent wrote: Requesting Chest Xray results

## 2018-01-27 NOTE — Telephone Encounter (Signed)
Message sent to Dr. Jordan for review. 

## 2018-01-29 DIAGNOSIS — N186 End stage renal disease: Secondary | ICD-10-CM | POA: Diagnosis not present

## 2018-01-29 DIAGNOSIS — D631 Anemia in chronic kidney disease: Secondary | ICD-10-CM | POA: Diagnosis not present

## 2018-01-29 DIAGNOSIS — N2581 Secondary hyperparathyroidism of renal origin: Secondary | ICD-10-CM | POA: Diagnosis not present

## 2018-01-29 DIAGNOSIS — E1129 Type 2 diabetes mellitus with other diabetic kidney complication: Secondary | ICD-10-CM | POA: Diagnosis not present

## 2018-02-01 DIAGNOSIS — D631 Anemia in chronic kidney disease: Secondary | ICD-10-CM | POA: Diagnosis not present

## 2018-02-01 DIAGNOSIS — N2581 Secondary hyperparathyroidism of renal origin: Secondary | ICD-10-CM | POA: Diagnosis not present

## 2018-02-01 DIAGNOSIS — N186 End stage renal disease: Secondary | ICD-10-CM | POA: Diagnosis not present

## 2018-02-01 NOTE — Telephone Encounter (Signed)
Left message for patient to give clinic a call for results.

## 2018-02-01 NOTE — Telephone Encounter (Signed)
CXR already reviewed.  BJ

## 2018-02-02 DIAGNOSIS — Z7901 Long term (current) use of anticoagulants: Secondary | ICD-10-CM | POA: Diagnosis not present

## 2018-02-02 NOTE — Telephone Encounter (Signed)
Left message for patient to give clinic a call back for labs.

## 2018-02-03 DIAGNOSIS — N186 End stage renal disease: Secondary | ICD-10-CM | POA: Diagnosis not present

## 2018-02-03 DIAGNOSIS — N2581 Secondary hyperparathyroidism of renal origin: Secondary | ICD-10-CM | POA: Diagnosis not present

## 2018-02-03 DIAGNOSIS — D631 Anemia in chronic kidney disease: Secondary | ICD-10-CM | POA: Diagnosis not present

## 2018-02-03 NOTE — Telephone Encounter (Signed)
Patient given results

## 2018-02-05 DIAGNOSIS — D631 Anemia in chronic kidney disease: Secondary | ICD-10-CM | POA: Diagnosis not present

## 2018-02-05 DIAGNOSIS — N2581 Secondary hyperparathyroidism of renal origin: Secondary | ICD-10-CM | POA: Diagnosis not present

## 2018-02-05 DIAGNOSIS — N186 End stage renal disease: Secondary | ICD-10-CM | POA: Diagnosis not present

## 2018-02-08 DIAGNOSIS — N2581 Secondary hyperparathyroidism of renal origin: Secondary | ICD-10-CM | POA: Diagnosis not present

## 2018-02-08 DIAGNOSIS — N186 End stage renal disease: Secondary | ICD-10-CM | POA: Diagnosis not present

## 2018-02-08 DIAGNOSIS — D631 Anemia in chronic kidney disease: Secondary | ICD-10-CM | POA: Diagnosis not present

## 2018-02-10 DIAGNOSIS — D631 Anemia in chronic kidney disease: Secondary | ICD-10-CM | POA: Diagnosis not present

## 2018-02-10 DIAGNOSIS — N2581 Secondary hyperparathyroidism of renal origin: Secondary | ICD-10-CM | POA: Diagnosis not present

## 2018-02-10 DIAGNOSIS — N186 End stage renal disease: Secondary | ICD-10-CM | POA: Diagnosis not present

## 2018-02-12 DIAGNOSIS — D631 Anemia in chronic kidney disease: Secondary | ICD-10-CM | POA: Diagnosis not present

## 2018-02-12 DIAGNOSIS — N2581 Secondary hyperparathyroidism of renal origin: Secondary | ICD-10-CM | POA: Diagnosis not present

## 2018-02-12 DIAGNOSIS — N186 End stage renal disease: Secondary | ICD-10-CM | POA: Diagnosis not present

## 2018-02-15 DIAGNOSIS — N186 End stage renal disease: Secondary | ICD-10-CM | POA: Diagnosis not present

## 2018-02-15 DIAGNOSIS — N2581 Secondary hyperparathyroidism of renal origin: Secondary | ICD-10-CM | POA: Diagnosis not present

## 2018-02-15 DIAGNOSIS — D631 Anemia in chronic kidney disease: Secondary | ICD-10-CM | POA: Diagnosis not present

## 2018-02-17 DIAGNOSIS — D631 Anemia in chronic kidney disease: Secondary | ICD-10-CM | POA: Diagnosis not present

## 2018-02-17 DIAGNOSIS — N186 End stage renal disease: Secondary | ICD-10-CM | POA: Diagnosis not present

## 2018-02-17 DIAGNOSIS — N2581 Secondary hyperparathyroidism of renal origin: Secondary | ICD-10-CM | POA: Diagnosis not present

## 2018-02-18 DIAGNOSIS — N186 End stage renal disease: Secondary | ICD-10-CM | POA: Diagnosis not present

## 2018-02-18 DIAGNOSIS — Z992 Dependence on renal dialysis: Secondary | ICD-10-CM | POA: Diagnosis not present

## 2018-02-18 DIAGNOSIS — I158 Other secondary hypertension: Secondary | ICD-10-CM | POA: Diagnosis not present

## 2018-02-19 DIAGNOSIS — N186 End stage renal disease: Secondary | ICD-10-CM | POA: Diagnosis not present

## 2018-02-19 DIAGNOSIS — D631 Anemia in chronic kidney disease: Secondary | ICD-10-CM | POA: Diagnosis not present

## 2018-02-19 DIAGNOSIS — N2581 Secondary hyperparathyroidism of renal origin: Secondary | ICD-10-CM | POA: Diagnosis not present

## 2018-02-22 DIAGNOSIS — N2581 Secondary hyperparathyroidism of renal origin: Secondary | ICD-10-CM | POA: Diagnosis not present

## 2018-02-22 DIAGNOSIS — D631 Anemia in chronic kidney disease: Secondary | ICD-10-CM | POA: Diagnosis not present

## 2018-02-22 DIAGNOSIS — N186 End stage renal disease: Secondary | ICD-10-CM | POA: Diagnosis not present

## 2018-02-23 DIAGNOSIS — Z7901 Long term (current) use of anticoagulants: Secondary | ICD-10-CM | POA: Diagnosis not present

## 2018-02-23 DIAGNOSIS — I480 Paroxysmal atrial fibrillation: Secondary | ICD-10-CM | POA: Diagnosis not present

## 2018-02-24 DIAGNOSIS — N2581 Secondary hyperparathyroidism of renal origin: Secondary | ICD-10-CM | POA: Diagnosis not present

## 2018-02-24 DIAGNOSIS — N186 End stage renal disease: Secondary | ICD-10-CM | POA: Diagnosis not present

## 2018-02-24 DIAGNOSIS — D631 Anemia in chronic kidney disease: Secondary | ICD-10-CM | POA: Diagnosis not present

## 2018-02-26 DIAGNOSIS — N2581 Secondary hyperparathyroidism of renal origin: Secondary | ICD-10-CM | POA: Diagnosis not present

## 2018-02-26 DIAGNOSIS — D631 Anemia in chronic kidney disease: Secondary | ICD-10-CM | POA: Diagnosis not present

## 2018-02-26 DIAGNOSIS — N186 End stage renal disease: Secondary | ICD-10-CM | POA: Diagnosis not present

## 2018-03-01 DIAGNOSIS — N186 End stage renal disease: Secondary | ICD-10-CM | POA: Diagnosis not present

## 2018-03-01 DIAGNOSIS — N2581 Secondary hyperparathyroidism of renal origin: Secondary | ICD-10-CM | POA: Diagnosis not present

## 2018-03-01 DIAGNOSIS — D631 Anemia in chronic kidney disease: Secondary | ICD-10-CM | POA: Diagnosis not present

## 2018-03-02 DIAGNOSIS — I480 Paroxysmal atrial fibrillation: Secondary | ICD-10-CM | POA: Diagnosis not present

## 2018-03-02 DIAGNOSIS — Z7901 Long term (current) use of anticoagulants: Secondary | ICD-10-CM | POA: Diagnosis not present

## 2018-03-03 DIAGNOSIS — N2581 Secondary hyperparathyroidism of renal origin: Secondary | ICD-10-CM | POA: Diagnosis not present

## 2018-03-03 DIAGNOSIS — D631 Anemia in chronic kidney disease: Secondary | ICD-10-CM | POA: Diagnosis not present

## 2018-03-03 DIAGNOSIS — N186 End stage renal disease: Secondary | ICD-10-CM | POA: Diagnosis not present

## 2018-03-05 DIAGNOSIS — N2581 Secondary hyperparathyroidism of renal origin: Secondary | ICD-10-CM | POA: Diagnosis not present

## 2018-03-05 DIAGNOSIS — D631 Anemia in chronic kidney disease: Secondary | ICD-10-CM | POA: Diagnosis not present

## 2018-03-05 DIAGNOSIS — N186 End stage renal disease: Secondary | ICD-10-CM | POA: Diagnosis not present

## 2018-03-08 DIAGNOSIS — N2581 Secondary hyperparathyroidism of renal origin: Secondary | ICD-10-CM | POA: Diagnosis not present

## 2018-03-08 DIAGNOSIS — N186 End stage renal disease: Secondary | ICD-10-CM | POA: Diagnosis not present

## 2018-03-08 DIAGNOSIS — D631 Anemia in chronic kidney disease: Secondary | ICD-10-CM | POA: Diagnosis not present

## 2018-03-08 NOTE — Progress Notes (Signed)
Jane [name] is a 72 y.o.female, who is here today to follow on HTN.  She has other concerns she would like to address today.  ESRD on dialysis and paroxysmal atrial fib on Coumadin.  She is reporting low BPs, 80s/60s, aggravated by dialysis.  Currently she is on Losartan 100 mg and Coreg 25 mg bid.  She has not noted unusual headache, visual changes, exertional chest pain, dyspnea,  focal weakness, or edema.   Constipation: Currently she is on Linzess 72 mcg daily, which she takes every other day because it causes diarrhea and aggravating her hemorrhoids. She reports history of hemorrhoids, occasional blood on tissue, exacerbated by constipation and diarrhea.  Abdominal pain: Diffuse, intermittent abdominal pain that has been getting worse for the past 3 to 4 months. She states that she is having similar symptoms she had when she underwent colectomy in 06/2016. She describes pain as sharp, "it hurts really bad."  Last colonoscopy 09/2015. She has not noted blood in the stool, fevers, chills, nausea, vomiting, or urinary symptoms. Pain is aggravated by food intake and constipation. She has not identified alleviating factors. "Gassy" feeling intermittently, aggravated by food intake.  CT abdomen/pelvis WO contrast in 07/2016:  1. Status post sigmoid colectomy, without acute complication. 2. Small volume cul-de-sac fluid, nonspecific. 3.  Possible constipation. 4.  Coronary artery atherosclerosis. Aortic atherosclerosis. 5. Anterior pelvic wall bilobed fluid collection is most likely a postoperative seroma or hematoma. Evaluation limited secondary to lack of IV contrast.  She has had abdominal/pelvic CTs on 06/25/2016, 07/03/2016, 07/09/2016 because recurrent abdominal pain.  Right knee pain: She has had problem for years. When she was living in [place] she received knee injections, last one in 2016.  She was doing well until a few months ago when right knee pain started  getting worse. 9/10, intermittently sharp medial knee pain. No recent knee injury.  According to patient she has been diagnosed with torn meniscus.  She is on Tylenol, she also applies icy hot and lidocaine patches.  It is exacerbated by prolonged walking, going up and down stairs. She feels like knee gives up, causing balance issues.  She has had about 6 falls this month, she denies serious harm.  She is also requesting an appointment with dermatologist. She has several skin lesions, and slow growth during the past year and new appearing. Some are irritated by clothing, depending of localization. She has not tried OTC medication.   Review of Systems  Constitutional: Positive for fatigue. Negative for activity change, appetite change and fever.  HENT: Negative for mouth sores, nosebleeds and trouble swallowing.   Eyes: Negative for redness and visual disturbance.  Respiratory: Negative for cough, shortness of breath and wheezing.   Cardiovascular: Negative for chest pain, palpitations and leg swelling.  Gastrointestinal: Positive for abdominal pain and constipation. Negative for nausea and vomiting.       Negative for changes in bowel habits.  Genitourinary: Negative for decreased urine volume and hematuria.  Musculoskeletal: Positive for arthralgias and gait problem. Negative for joint swelling.  Skin: Negative for rash and wound.  Neurological: Negative for syncope, weakness and headaches.  Psychiatric/Behavioral: Negative for confusion. The patient is nervous/anxious.      Current Outpatient Medications on File Prior to Visit  Medication Sig Dispense Refill  . albuterol (PROVENTIL HFA;VENTOLIN HFA) 108 (90 Base) MCG/ACT inhaler Inhale 1 puff into the lungs every 6 (six) hours as needed for wheezing or shortness of breath. 6.7 g 2  .  Amino Acids-Protein Hydrolys (FEEDING SUPPLEMENT, PRO-STAT SUGAR FREE 64,) LIQD Take 30 mLs by mouth 2 (two) times daily. 900 mL 0  . amiodarone  (PACERONE) 200 MG tablet TAKE 1 TABLET BY MOUTH ONCE DAILY 90 tablet 1  . b complex vitamins capsule Take by mouth.    . calcium carbonate (TUMS - DOSED IN MG ELEMENTAL CALCIUM) 500 MG chewable tablet Chew 2 tablets by mouth 3 (three) times daily with meals.    . carvedilol (COREG) 25 MG tablet TAKE 2 TABLETS BY MOUTH TWICE DAILY WITH A MEAL 360 tablet 2  . cinacalcet (SENSIPAR) 30 MG tablet Take 30 mg by mouth daily.    . Eflornithine HCl 13.9 % cream Apply 1 application topically 2 (two) times daily. At least 8 hours apart. 45 g 1  . ethyl chloride spray USE 1 TO 2 SPRAYS TO DIALYSIS ACCESS EVERY TREATMENT  12  . multivitamin (RENA-VIT) TABS tablet Take 1 tablet by mouth daily.    . sevelamer carbonate (RENVELA) 800 MG tablet Take 800-1,600 mg by mouth See admin instructions. 1,600 mg after meals TWO times a day and 800 mg after each snack    . warfarin (COUMADIN) 5 MG tablet TAKE AS DIRECTED 50 tablet 3   No current facility-administered medications on file prior to visit.      Past Medical History:  Diagnosis Date  . Acid reflux   . Atrial fibrillation ([place])   . Hypertension   . Renal disorder   . Sleep apnea     Allergies  Allergen Reactions  . Latex Rash  . Other Other (See Comments)    The plastic tape irritates her skin.  Silicone tapes cause redness  . Penicillins Rash  . Sulfa Antibiotics Rash  . Tape Other (See Comments)    PLASTIC TAPE TEARS OFF THE SKIN AND BRUISES IT TERRIBLY!! GETS ITCHY AND RASH FROM PAPER AND PLASTIC TAPES PLASTIC TAPE TEARS OFF THE SKIN AND BRUISES IT TERRIBLY!!    Social History   Socioeconomic History  . Marital status: Single    Spouse name: Not on file  . Number of children: Not on file  . Years of education: Not on file  . Highest education level: Not on file  Occupational History  . Not on file  Social Needs  . Financial resource strain: Not on file  . Food insecurity:    Worry: Not on file    Inability: Not on file  .  Transportation needs:    Medical: Not on file    Non-medical: Not on file  Tobacco Use  . Smoking status: Never Smoker  . Smokeless tobacco: Never Used  Substance and Sexual Activity  . Alcohol use: No  . Drug use: No  . Sexual activity: Never  Lifestyle  . Physical activity:    Days per week: Not on file    Minutes per session: Not on file  . Stress: Not on file  Relationships  . Social connections:    Talks on phone: Not on file    Gets together: Not on file    Attends religious service: Not on file    Active member of club or organization: Not on file    Attends meetings of clubs or organizations: Not on file    Relationship status: Not on file  Other Topics Concern  . Not on file  Social History Narrative  . Not on file    Vitals:   03/09/18 1041  BP: 126/82  Pulse:  80  Resp: 16  Temp: 98.1 F (36.7 C)  SpO2: 96%   Body mass index is 40.33 kg/m.    Physical Exam  Nursing note and vitals reviewed. Constitutional: She is oriented to person, place, and time. She appears well-developed. No distress.  HENT:  Head: Normocephalic and atraumatic.  Mouth/Throat: Oropharynx is clear and moist and mucous membranes are normal.  Eyes: Conjunctivae are normal.  Cardiovascular: Normal rate and regular rhythm.  No murmur heard. Respiratory: Effort normal and breath sounds normal. No respiratory distress.  GI: Soft. She exhibits no mass. There is no hepatomegaly. There is no tenderness.  Musculoskeletal: She exhibits no edema.       Right knee: She exhibits no swelling and no erythema. Tenderness found. Medial joint line tenderness noted.  Right knee: on inspection no effusion, erythema, or deformities.  Tenderness upon palpation of intra-articular medial area. Anterior and posterior drawer test negative. Patellar apprehension test negative. ROM otherwise normal.  Neurological: She is alert and oriented to person, place, and time. She has normal strength. No cranial  nerve deficit. Gait normal.  Skin: Skin is warm. Lesion noted. No rash noted. No erythema.  Scattered pediculated,pigmented skin lesions.  Soft and nontender. She has some in left groin area, on upper back, and lower extremities. All other flat/thick,and crusty lesions, regular borders, 2 to 3 mm on back.   Psychiatric: Her mood appears anxious.  Well groomed, good eye contact.    ASSESSMENT AND PLAN:   Ms. Lalonnie was seen today for follow-up.  Diagnoses and all orders for this visit:  Chronic pain of right knee  Because knee pain is getting worse, Ortho referral was placed. Voltaren gel 4 times daily as needed recommended. She can also continue Tylenol 500 mg 3 times daily as needed. Fall precautions discussed.  -     Ambulatory referral to Orthopedic Surgery -     diclofenac sodium (VOLTAREN) 1 % GEL; Apply 4 g topically 4 (four) times daily.  Abdominal pain, diffuse Abdominal pain seems to be chronic, we discussed possible etiologies, including IBS constipation and diverticulosis. Abdominal examination today is negative. I do not think imaging is needed at this time. For now she prefers to hold on GI referral, we will treat constipation and reevaluate in 2 months.   Seborrheic keratosis Skin lesions most likely benign, skin tags and seborrheic keratosis. Explained that her health insurance may not cover treatment. Referral to dermatology placed.  -     Ambulatory referral to Dermatology   Constipation Linzess is causing diarrhea. We discussed other pharmacologic options: MiraLAX with bisacodyl Vs Amitiza once daily, she preferred the latter one. We reviewed  some side effects. Adequate hydration and fiber intake. Follow-up in 2 months.  Hypertension with renal disease Losartan decreased from 100 mg to 50 mg. No changes in Coreg. Continue low-salt diet. Continue monitoring BP, which has been monitored during dialysis as well. Follow-up in 6 months.     Elmo Rio  G. [name], MD  [facility] [place] office.

## 2018-03-09 ENCOUNTER — Ambulatory Visit (INDEPENDENT_AMBULATORY_CARE_PROVIDER_SITE_OTHER): Payer: Medicare Other | Admitting: Family Medicine

## 2018-03-09 ENCOUNTER — Encounter: Payer: Self-pay | Admitting: Family Medicine

## 2018-03-09 ENCOUNTER — Ambulatory Visit: Payer: Medicare Other | Admitting: Family Medicine

## 2018-03-09 VITALS — BP 126/82 | HR 80 | Temp 98.1°F | Resp 16 | Ht 65.0 in | Wt 242.4 lb

## 2018-03-09 DIAGNOSIS — G8929 Other chronic pain: Secondary | ICD-10-CM | POA: Diagnosis not present

## 2018-03-09 DIAGNOSIS — I129 Hypertensive chronic kidney disease with stage 1 through stage 4 chronic kidney disease, or unspecified chronic kidney disease: Secondary | ICD-10-CM

## 2018-03-09 DIAGNOSIS — K5900 Constipation, unspecified: Secondary | ICD-10-CM | POA: Diagnosis not present

## 2018-03-09 DIAGNOSIS — M25561 Pain in right knee: Secondary | ICD-10-CM | POA: Diagnosis not present

## 2018-03-09 DIAGNOSIS — R1084 Generalized abdominal pain: Secondary | ICD-10-CM | POA: Diagnosis not present

## 2018-03-09 DIAGNOSIS — L821 Other seborrheic keratosis: Secondary | ICD-10-CM | POA: Diagnosis not present

## 2018-03-09 MED ORDER — DICLOFENAC SODIUM 1 % TD GEL: 4.0000 g | Freq: Four times a day (QID) | TRANSDERMAL | 3 refills | Status: DC

## 2018-03-09 MED ORDER — LUBIPROSTONE 8 MCG PO CAPS: 8.0000 ug | ORAL_CAPSULE | Freq: Every day | ORAL | 1 refills | Status: DC

## 2018-03-09 MED ORDER — LOSARTAN POTASSIUM 50 MG PO TABS: 50.0000 mg | ORAL_TABLET | Freq: Every day | ORAL | 1 refills | Status: DC

## 2018-03-09 NOTE — Patient Instructions (Addendum)
A few things to remember from today's visit:   Hypertension with renal disease - Plan: losartan (COZAAR) 50 MG tablet  Constipation, unspecified constipation type - Plan: lubiprostone (AMITIZA) 8 MCG capsule  Seborrheic keratosis - Plan: Ambulatory referral to Dermatology  Chronic pain of right knee - Plan: Ambulatory referral to Orthopedic Surgery, diclofenac sodium (VOLTAREN) 1 % GEL  Losartan decreased. Diclofenac topical added. No blood pressure meds when dialysis.Linzess stop  Start Amitiza once daily.    Please be sure medication list is accurate. If a new problem present, please set up appointment sooner than planned today.

## 2018-03-09 NOTE — Assessment & Plan Note (Signed)
Linzess is causing diarrhea. We discussed other pharmacologic options: MiraLAX with bisacodyl Vs Amitiza once daily, she preferred the latter one. We reviewed  some side effects. Adequate hydration and fiber intake. Follow-up in 2 months.

## 2018-03-09 NOTE — Assessment & Plan Note (Addendum)
Losartan decreased from 100 mg to 50 mg. No changes in Coreg. Continue low-salt diet. Continue monitoring BP, which has been monitored during dialysis as well. Follow-up in 6 months.

## 2018-03-10 DIAGNOSIS — N2581 Secondary hyperparathyroidism of renal origin: Secondary | ICD-10-CM | POA: Diagnosis not present

## 2018-03-10 DIAGNOSIS — D631 Anemia in chronic kidney disease: Secondary | ICD-10-CM | POA: Diagnosis not present

## 2018-03-10 DIAGNOSIS — N186 End stage renal disease: Secondary | ICD-10-CM | POA: Diagnosis not present

## 2018-03-11 DIAGNOSIS — Z7901 Long term (current) use of anticoagulants: Secondary | ICD-10-CM | POA: Diagnosis not present

## 2018-03-12 DIAGNOSIS — N2581 Secondary hyperparathyroidism of renal origin: Secondary | ICD-10-CM | POA: Diagnosis not present

## 2018-03-12 DIAGNOSIS — D631 Anemia in chronic kidney disease: Secondary | ICD-10-CM | POA: Diagnosis not present

## 2018-03-12 DIAGNOSIS — N186 End stage renal disease: Secondary | ICD-10-CM | POA: Diagnosis not present

## 2018-03-13 ENCOUNTER — Encounter: Payer: Self-pay | Admitting: Family Medicine

## 2018-03-15 DIAGNOSIS — D631 Anemia in chronic kidney disease: Secondary | ICD-10-CM | POA: Diagnosis not present

## 2018-03-15 DIAGNOSIS — N186 End stage renal disease: Secondary | ICD-10-CM | POA: Diagnosis not present

## 2018-03-15 DIAGNOSIS — N2581 Secondary hyperparathyroidism of renal origin: Secondary | ICD-10-CM | POA: Diagnosis not present

## 2018-03-17 DIAGNOSIS — N2581 Secondary hyperparathyroidism of renal origin: Secondary | ICD-10-CM | POA: Diagnosis not present

## 2018-03-17 DIAGNOSIS — D631 Anemia in chronic kidney disease: Secondary | ICD-10-CM | POA: Diagnosis not present

## 2018-03-17 DIAGNOSIS — N186 End stage renal disease: Secondary | ICD-10-CM | POA: Diagnosis not present

## 2018-03-18 ENCOUNTER — Ambulatory Visit (INDEPENDENT_AMBULATORY_CARE_PROVIDER_SITE_OTHER): Payer: Medicare Other | Admitting: Orthopedic Surgery

## 2018-03-18 ENCOUNTER — Encounter (INDEPENDENT_AMBULATORY_CARE_PROVIDER_SITE_OTHER): Payer: Self-pay | Admitting: Orthopedic Surgery

## 2018-03-18 ENCOUNTER — Ambulatory Visit (INDEPENDENT_AMBULATORY_CARE_PROVIDER_SITE_OTHER): Payer: Self-pay

## 2018-03-18 VITALS — Ht 65.0 in | Wt 242.4 lb

## 2018-03-18 DIAGNOSIS — Z6841 Body Mass Index (BMI) 40.0 and over, adult: Secondary | ICD-10-CM

## 2018-03-18 DIAGNOSIS — M25561 Pain in right knee: Secondary | ICD-10-CM

## 2018-03-18 DIAGNOSIS — G8929 Other chronic pain: Secondary | ICD-10-CM

## 2018-03-18 DIAGNOSIS — M1711 Unilateral primary osteoarthritis, right knee: Secondary | ICD-10-CM

## 2018-03-18 MED ORDER — LIDOCAINE HCL 1 % IJ SOLN
5.0000 mL | INTRAMUSCULAR | Status: AC | PRN
  Administered 2018-03-18: 5 mL

## 2018-03-18 MED ORDER — METHYLPREDNISOLONE ACETATE 40 MG/ML IJ SUSP
40.0000 mg | INTRAMUSCULAR | Status: AC | PRN
  Administered 2018-03-18: 40 mg via INTRA_ARTICULAR

## 2018-03-18 NOTE — Progress Notes (Signed)
Office Visit Note   Patient: Jane [name]           Date of Birth: [birth year]           MRN: [id number] Visit Date: 03/18/2018              Requested by: [name], [name], MD [address] [place], [place] [address] PCP: [name], [name], MD  Chief Complaint  Patient presents with  . Right Knee - Pain      HPI: Patient is a 72 year old woman who has moved from [place] she is status post a left total knee arthroplasty.  She has had arthritic pain in the right knee for the past 5 years.  She states the last injection in the right knee was about 3 years ago.  Patient states she is falling from mechanical symptoms in her right knee.  Past medical history is updated she is on dialysis.  Assessment & Plan: Visit Diagnoses:  1. Chronic pain of right knee   2. Unilateral primary osteoarthritis, right knee   3. Morbid obesity ([place])   4. Body mass index 40.0-44.9, adult ([facility])     Plan: Recommended diet and nutrition with protein powder supplements.  Discussed carbohydrate modification, weight loss for possible surgical intervention.  Reevaluate in 4 weeks for possible repeat injection.  Follow-Up Instructions: Return in about 4 weeks (around 04/15/2018).   Ortho Exam  Patient is alert, oriented, no adenopathy, well-dressed, normal affect, normal respiratory effort. Examination patient has an antalgic gait.  She is maximally tender to palpation of the medial joint line she has varus alignment to the right knee collaterals and cruciates are stable there is no effusion.  There is crepitation to range of motion in the patellofemoral joint.  Patient has a BMI greater than 40.  She states her body weight is 242 with a height of 5 foot 5.  There is no redness no cellulitis no signs of infection she has no effusion.  Imaging: Xr Knee 3 View Right  Result Date: 03/18/2018 3 view radiographs of the right knee shows lateral tracking of the patella in the sunrise view AP view shows  bone-on-bone contact of the with subcondylar sclerosis and periarticular bony spurs patient has calcification of her popliteal vessels with a stable left total knee arthroplasty.  No images are attached to the encounter.  Labs: Lab Results  Component Value Date   REPTSTATUS 08/05/2016 FINAL 08/04/2016   CULT (A) 08/04/2016    >=100,000 COLONIES/mL LACTOBACILLUS SPECIES Standardized susceptibility testing for this organism is not available.    LABORGA KLEBSIELLA PNEUMONIAE 06/30/2012     Lab Results  Component Value Date   ALBUMIN 3.2 (L) 08/04/2016   ALBUMIN 2.5 (L) 07/18/2016   ALBUMIN 2.5 (L) 07/16/2016    Body mass index is 40.33 kg/m.  Orders:  Orders Placed This Encounter  Procedures  . XR KNEE 3 VIEW RIGHT   No orders of the defined types were placed in this encounter.    Procedures: Large Joint Inj: R knee on 03/18/2018 2:35 PM Indications: pain and diagnostic evaluation Details: 22 G 1.5 in needle, anteromedial approach  Arthrogram: No  Medications: 5 mL lidocaine 1 %; 40 mg methylPREDNISolone acetate 40 MG/ML Outcome: tolerated well, no immediate complications Procedure, treatment alternatives, risks and benefits explained, specific risks discussed. Consent was given by the patient. Immediately prior to procedure a time out was called to verify the correct patient, procedure, equipment, support staff and site/side marked as required.  Patient was prepped and draped in the usual sterile fashion.      Clinical Data: No additional findings.  ROS:  All other systems negative, except as noted in the HPI. Review of Systems  Objective: Vital Signs: Ht 5\' 5"  (1.651 m)   Wt 242 lb 6.1 oz (109.9 kg)   BMI 40.33 kg/m   Specialty Comments:  No specialty comments available.  PMFS History: Patient Active Problem List   Diagnosis Date Noted  . Body mass index 40.0-44.9, adult ([place]) 03/18/2018  . Unilateral primary osteoarthritis, right knee 03/18/2018  .  Chronic pain of right knee 03/18/2018  . Abnormal facial hair 11/10/2017  . Chronic anticoagulation 11/10/2017  . GERD (gastroesophageal reflux disease) 06/16/2017  . Constipation 10/16/2016  . Morbid obesity ([place]) 10/16/2016  . Diverticulitis of large intestine with perforation 06/26/2016  . Anemia in other chronic diseases classified elsewhere 06/25/2016  . Chest pain with moderate risk for cardiac etiology 06/25/2016  . ESRD on hemodialysis ([place]) 06/30/2012  . Paroxysmal atrial fibrillation ([place]) 06/30/2012  . Hypertension with renal disease 06/30/2012   Past Medical History:  Diagnosis Date  . Acid reflux   . Atrial fibrillation ([place])   . Hypertension   . Renal disorder   . Sleep apnea     Family History  Problem Relation Age of Onset  . Heart failure Mother   . Stroke Mother   . Other Father     Past Surgical History:  Procedure Laterality Date  . IR GENERIC HISTORICAL  07/09/2016   IR US GUIDE VASC ACCESS RIGHT 07/09/2016 Arne Cleveland, MD [facility]  . IR GENERIC HISTORICAL  07/09/2016   IR FLUORO GUIDE CV LINE RIGHT 07/09/2016 Arne Cleveland, MD [facility]  . KNEE ARTHROPLASTY    . LAPAROSCOPIC SIGMOID COLECTOMY N/A 07/11/2016   Procedure: LAPAROSCOPIC SIGMOID COLECTOMY;  Surgeon: Clovis Riley, MD;  Location: [facility];  Service: General;  Laterality: N/A;  . TUBAL LIGATION     Social History   Occupational History  . Not on file  Tobacco Use  . Smoking status: Never Smoker  . Smokeless tobacco: Never Used  Substance and Sexual Activity  . Alcohol use: No  . Drug use: No  . Sexual activity: Never

## 2018-03-19 DIAGNOSIS — N186 End stage renal disease: Secondary | ICD-10-CM | POA: Diagnosis not present

## 2018-03-19 DIAGNOSIS — D631 Anemia in chronic kidney disease: Secondary | ICD-10-CM | POA: Diagnosis not present

## 2018-03-19 DIAGNOSIS — N2581 Secondary hyperparathyroidism of renal origin: Secondary | ICD-10-CM | POA: Diagnosis not present

## 2018-03-21 DIAGNOSIS — I158 Other secondary hypertension: Secondary | ICD-10-CM | POA: Diagnosis not present

## 2018-03-21 DIAGNOSIS — N186 End stage renal disease: Secondary | ICD-10-CM | POA: Diagnosis not present

## 2018-03-21 DIAGNOSIS — Z992 Dependence on renal dialysis: Secondary | ICD-10-CM | POA: Diagnosis not present

## 2018-03-22 DIAGNOSIS — D509 Iron deficiency anemia, unspecified: Secondary | ICD-10-CM | POA: Diagnosis not present

## 2018-03-22 DIAGNOSIS — Z23 Encounter for immunization: Secondary | ICD-10-CM | POA: Diagnosis not present

## 2018-03-22 DIAGNOSIS — D631 Anemia in chronic kidney disease: Secondary | ICD-10-CM | POA: Diagnosis not present

## 2018-03-22 DIAGNOSIS — N2581 Secondary hyperparathyroidism of renal origin: Secondary | ICD-10-CM | POA: Diagnosis not present

## 2018-03-22 DIAGNOSIS — N186 End stage renal disease: Secondary | ICD-10-CM | POA: Diagnosis not present

## 2018-03-23 DIAGNOSIS — Z7901 Long term (current) use of anticoagulants: Secondary | ICD-10-CM | POA: Diagnosis not present

## 2018-03-24 DIAGNOSIS — N2581 Secondary hyperparathyroidism of renal origin: Secondary | ICD-10-CM | POA: Diagnosis not present

## 2018-03-24 DIAGNOSIS — D631 Anemia in chronic kidney disease: Secondary | ICD-10-CM | POA: Diagnosis not present

## 2018-03-24 DIAGNOSIS — D509 Iron deficiency anemia, unspecified: Secondary | ICD-10-CM | POA: Diagnosis not present

## 2018-03-24 DIAGNOSIS — N186 End stage renal disease: Secondary | ICD-10-CM | POA: Diagnosis not present

## 2018-03-24 DIAGNOSIS — Z23 Encounter for immunization: Secondary | ICD-10-CM | POA: Diagnosis not present

## 2018-03-26 ENCOUNTER — Telehealth: Payer: Self-pay | Admitting: *Deleted

## 2018-03-26 DIAGNOSIS — Z23 Encounter for immunization: Secondary | ICD-10-CM | POA: Diagnosis not present

## 2018-03-26 DIAGNOSIS — D631 Anemia in chronic kidney disease: Secondary | ICD-10-CM | POA: Diagnosis not present

## 2018-03-26 DIAGNOSIS — N2581 Secondary hyperparathyroidism of renal origin: Secondary | ICD-10-CM | POA: Diagnosis not present

## 2018-03-26 DIAGNOSIS — D509 Iron deficiency anemia, unspecified: Secondary | ICD-10-CM | POA: Diagnosis not present

## 2018-03-26 DIAGNOSIS — N186 End stage renal disease: Secondary | ICD-10-CM | POA: Diagnosis not present

## 2018-03-26 NOTE — Telephone Encounter (Signed)
Prior auth for Diclofenac sodium 1% gel sent to [email or web address].

## 2018-03-29 DIAGNOSIS — Z23 Encounter for immunization: Secondary | ICD-10-CM | POA: Diagnosis not present

## 2018-03-29 DIAGNOSIS — N2581 Secondary hyperparathyroidism of renal origin: Secondary | ICD-10-CM | POA: Diagnosis not present

## 2018-03-29 DIAGNOSIS — D509 Iron deficiency anemia, unspecified: Secondary | ICD-10-CM | POA: Diagnosis not present

## 2018-03-29 DIAGNOSIS — N186 End stage renal disease: Secondary | ICD-10-CM | POA: Diagnosis not present

## 2018-03-29 DIAGNOSIS — D631 Anemia in chronic kidney disease: Secondary | ICD-10-CM | POA: Diagnosis not present

## 2018-03-29 NOTE — Telephone Encounter (Signed)
Fax received from [place] stating the request was denied and this was given to Dr Doug Sou asst.

## 2018-03-29 NOTE — Telephone Encounter (Signed)
Denial given to Dr. [name] for review.

## 2018-03-31 DIAGNOSIS — N2581 Secondary hyperparathyroidism of renal origin: Secondary | ICD-10-CM | POA: Diagnosis not present

## 2018-03-31 DIAGNOSIS — Z23 Encounter for immunization: Secondary | ICD-10-CM | POA: Diagnosis not present

## 2018-03-31 DIAGNOSIS — N186 End stage renal disease: Secondary | ICD-10-CM | POA: Diagnosis not present

## 2018-03-31 DIAGNOSIS — D631 Anemia in chronic kidney disease: Secondary | ICD-10-CM | POA: Diagnosis not present

## 2018-03-31 DIAGNOSIS — D509 Iron deficiency anemia, unspecified: Secondary | ICD-10-CM | POA: Diagnosis not present

## 2018-04-02 DIAGNOSIS — D509 Iron deficiency anemia, unspecified: Secondary | ICD-10-CM | POA: Diagnosis not present

## 2018-04-02 DIAGNOSIS — D631 Anemia in chronic kidney disease: Secondary | ICD-10-CM | POA: Diagnosis not present

## 2018-04-02 DIAGNOSIS — N2581 Secondary hyperparathyroidism of renal origin: Secondary | ICD-10-CM | POA: Diagnosis not present

## 2018-04-02 DIAGNOSIS — N186 End stage renal disease: Secondary | ICD-10-CM | POA: Diagnosis not present

## 2018-04-02 DIAGNOSIS — Z23 Encounter for immunization: Secondary | ICD-10-CM | POA: Diagnosis not present

## 2018-04-05 DIAGNOSIS — L689 Hypertrichosis, unspecified: Secondary | ICD-10-CM | POA: Diagnosis not present

## 2018-04-05 DIAGNOSIS — L820 Inflamed seborrheic keratosis: Secondary | ICD-10-CM | POA: Diagnosis not present

## 2018-04-05 DIAGNOSIS — D509 Iron deficiency anemia, unspecified: Secondary | ICD-10-CM | POA: Diagnosis not present

## 2018-04-05 DIAGNOSIS — N186 End stage renal disease: Secondary | ICD-10-CM | POA: Diagnosis not present

## 2018-04-05 DIAGNOSIS — Z23 Encounter for immunization: Secondary | ICD-10-CM | POA: Diagnosis not present

## 2018-04-05 DIAGNOSIS — D631 Anemia in chronic kidney disease: Secondary | ICD-10-CM | POA: Diagnosis not present

## 2018-04-05 DIAGNOSIS — N2581 Secondary hyperparathyroidism of renal origin: Secondary | ICD-10-CM | POA: Diagnosis not present

## 2018-04-06 ENCOUNTER — Other Ambulatory Visit: Payer: Self-pay | Admitting: Family Medicine

## 2018-04-06 DIAGNOSIS — I480 Paroxysmal atrial fibrillation: Secondary | ICD-10-CM

## 2018-04-07 DIAGNOSIS — N2581 Secondary hyperparathyroidism of renal origin: Secondary | ICD-10-CM | POA: Diagnosis not present

## 2018-04-07 DIAGNOSIS — D631 Anemia in chronic kidney disease: Secondary | ICD-10-CM | POA: Diagnosis not present

## 2018-04-07 DIAGNOSIS — Z23 Encounter for immunization: Secondary | ICD-10-CM | POA: Diagnosis not present

## 2018-04-07 DIAGNOSIS — N186 End stage renal disease: Secondary | ICD-10-CM | POA: Diagnosis not present

## 2018-04-07 DIAGNOSIS — D509 Iron deficiency anemia, unspecified: Secondary | ICD-10-CM | POA: Diagnosis not present

## 2018-04-09 DIAGNOSIS — D631 Anemia in chronic kidney disease: Secondary | ICD-10-CM | POA: Diagnosis not present

## 2018-04-09 DIAGNOSIS — Z23 Encounter for immunization: Secondary | ICD-10-CM | POA: Diagnosis not present

## 2018-04-09 DIAGNOSIS — N186 End stage renal disease: Secondary | ICD-10-CM | POA: Diagnosis not present

## 2018-04-09 DIAGNOSIS — D509 Iron deficiency anemia, unspecified: Secondary | ICD-10-CM | POA: Diagnosis not present

## 2018-04-09 DIAGNOSIS — N2581 Secondary hyperparathyroidism of renal origin: Secondary | ICD-10-CM | POA: Diagnosis not present

## 2018-04-12 DIAGNOSIS — N186 End stage renal disease: Secondary | ICD-10-CM | POA: Diagnosis not present

## 2018-04-12 DIAGNOSIS — D631 Anemia in chronic kidney disease: Secondary | ICD-10-CM | POA: Diagnosis not present

## 2018-04-12 DIAGNOSIS — N2581 Secondary hyperparathyroidism of renal origin: Secondary | ICD-10-CM | POA: Diagnosis not present

## 2018-04-12 DIAGNOSIS — D509 Iron deficiency anemia, unspecified: Secondary | ICD-10-CM | POA: Diagnosis not present

## 2018-04-12 DIAGNOSIS — Z23 Encounter for immunization: Secondary | ICD-10-CM | POA: Diagnosis not present

## 2018-04-13 DIAGNOSIS — Z7901 Long term (current) use of anticoagulants: Secondary | ICD-10-CM | POA: Diagnosis not present

## 2018-04-13 NOTE — Telephone Encounter (Signed)
Error

## 2018-04-14 DIAGNOSIS — Z23 Encounter for immunization: Secondary | ICD-10-CM | POA: Diagnosis not present

## 2018-04-14 DIAGNOSIS — N2581 Secondary hyperparathyroidism of renal origin: Secondary | ICD-10-CM | POA: Diagnosis not present

## 2018-04-14 DIAGNOSIS — N186 End stage renal disease: Secondary | ICD-10-CM | POA: Diagnosis not present

## 2018-04-14 DIAGNOSIS — D509 Iron deficiency anemia, unspecified: Secondary | ICD-10-CM | POA: Diagnosis not present

## 2018-04-14 DIAGNOSIS — D631 Anemia in chronic kidney disease: Secondary | ICD-10-CM | POA: Diagnosis not present

## 2018-04-15 ENCOUNTER — Ambulatory Visit (INDEPENDENT_AMBULATORY_CARE_PROVIDER_SITE_OTHER): Payer: Medicare Other | Admitting: Orthopedic Surgery

## 2018-04-16 DIAGNOSIS — N186 End stage renal disease: Secondary | ICD-10-CM | POA: Diagnosis not present

## 2018-04-16 DIAGNOSIS — D509 Iron deficiency anemia, unspecified: Secondary | ICD-10-CM | POA: Diagnosis not present

## 2018-04-16 DIAGNOSIS — Z23 Encounter for immunization: Secondary | ICD-10-CM | POA: Diagnosis not present

## 2018-04-16 DIAGNOSIS — D631 Anemia in chronic kidney disease: Secondary | ICD-10-CM | POA: Diagnosis not present

## 2018-04-16 DIAGNOSIS — N2581 Secondary hyperparathyroidism of renal origin: Secondary | ICD-10-CM | POA: Diagnosis not present

## 2018-04-19 DIAGNOSIS — D509 Iron deficiency anemia, unspecified: Secondary | ICD-10-CM | POA: Diagnosis not present

## 2018-04-19 DIAGNOSIS — N186 End stage renal disease: Secondary | ICD-10-CM | POA: Diagnosis not present

## 2018-04-19 DIAGNOSIS — N2581 Secondary hyperparathyroidism of renal origin: Secondary | ICD-10-CM | POA: Diagnosis not present

## 2018-04-19 DIAGNOSIS — D631 Anemia in chronic kidney disease: Secondary | ICD-10-CM | POA: Diagnosis not present

## 2018-04-19 DIAGNOSIS — Z23 Encounter for immunization: Secondary | ICD-10-CM | POA: Diagnosis not present

## 2018-04-20 DIAGNOSIS — Z992 Dependence on renal dialysis: Secondary | ICD-10-CM | POA: Diagnosis not present

## 2018-04-20 DIAGNOSIS — N186 End stage renal disease: Secondary | ICD-10-CM | POA: Diagnosis not present

## 2018-04-20 DIAGNOSIS — I480 Paroxysmal atrial fibrillation: Secondary | ICD-10-CM | POA: Diagnosis not present

## 2018-04-20 DIAGNOSIS — Z7901 Long term (current) use of anticoagulants: Secondary | ICD-10-CM | POA: Diagnosis not present

## 2018-04-20 DIAGNOSIS — I158 Other secondary hypertension: Secondary | ICD-10-CM | POA: Diagnosis not present

## 2018-04-21 DIAGNOSIS — N186 End stage renal disease: Secondary | ICD-10-CM | POA: Diagnosis not present

## 2018-04-21 DIAGNOSIS — D509 Iron deficiency anemia, unspecified: Secondary | ICD-10-CM | POA: Diagnosis not present

## 2018-04-21 DIAGNOSIS — N2581 Secondary hyperparathyroidism of renal origin: Secondary | ICD-10-CM | POA: Diagnosis not present

## 2018-04-21 DIAGNOSIS — D631 Anemia in chronic kidney disease: Secondary | ICD-10-CM | POA: Diagnosis not present

## 2018-04-21 DIAGNOSIS — E1129 Type 2 diabetes mellitus with other diabetic kidney complication: Secondary | ICD-10-CM | POA: Diagnosis not present

## 2018-04-23 DIAGNOSIS — D509 Iron deficiency anemia, unspecified: Secondary | ICD-10-CM | POA: Diagnosis not present

## 2018-04-23 DIAGNOSIS — N2581 Secondary hyperparathyroidism of renal origin: Secondary | ICD-10-CM | POA: Diagnosis not present

## 2018-04-23 DIAGNOSIS — N186 End stage renal disease: Secondary | ICD-10-CM | POA: Diagnosis not present

## 2018-04-23 DIAGNOSIS — D631 Anemia in chronic kidney disease: Secondary | ICD-10-CM | POA: Diagnosis not present

## 2018-04-26 DIAGNOSIS — D509 Iron deficiency anemia, unspecified: Secondary | ICD-10-CM | POA: Diagnosis not present

## 2018-04-26 DIAGNOSIS — D631 Anemia in chronic kidney disease: Secondary | ICD-10-CM | POA: Diagnosis not present

## 2018-04-26 DIAGNOSIS — N2581 Secondary hyperparathyroidism of renal origin: Secondary | ICD-10-CM | POA: Diagnosis not present

## 2018-04-26 DIAGNOSIS — N186 End stage renal disease: Secondary | ICD-10-CM | POA: Diagnosis not present

## 2018-04-28 DIAGNOSIS — N2581 Secondary hyperparathyroidism of renal origin: Secondary | ICD-10-CM | POA: Diagnosis not present

## 2018-04-28 DIAGNOSIS — N186 End stage renal disease: Secondary | ICD-10-CM | POA: Diagnosis not present

## 2018-04-28 DIAGNOSIS — D631 Anemia in chronic kidney disease: Secondary | ICD-10-CM | POA: Diagnosis not present

## 2018-04-28 DIAGNOSIS — D509 Iron deficiency anemia, unspecified: Secondary | ICD-10-CM | POA: Diagnosis not present

## 2018-04-30 DIAGNOSIS — D509 Iron deficiency anemia, unspecified: Secondary | ICD-10-CM | POA: Diagnosis not present

## 2018-04-30 DIAGNOSIS — N2581 Secondary hyperparathyroidism of renal origin: Secondary | ICD-10-CM | POA: Diagnosis not present

## 2018-04-30 DIAGNOSIS — D631 Anemia in chronic kidney disease: Secondary | ICD-10-CM | POA: Diagnosis not present

## 2018-04-30 DIAGNOSIS — N186 End stage renal disease: Secondary | ICD-10-CM | POA: Diagnosis not present

## 2018-05-03 DIAGNOSIS — D509 Iron deficiency anemia, unspecified: Secondary | ICD-10-CM | POA: Diagnosis not present

## 2018-05-03 DIAGNOSIS — D631 Anemia in chronic kidney disease: Secondary | ICD-10-CM | POA: Diagnosis not present

## 2018-05-03 DIAGNOSIS — N2581 Secondary hyperparathyroidism of renal origin: Secondary | ICD-10-CM | POA: Diagnosis not present

## 2018-05-03 DIAGNOSIS — N186 End stage renal disease: Secondary | ICD-10-CM | POA: Diagnosis not present

## 2018-05-04 DIAGNOSIS — I480 Paroxysmal atrial fibrillation: Secondary | ICD-10-CM | POA: Diagnosis not present

## 2018-05-04 DIAGNOSIS — Z7901 Long term (current) use of anticoagulants: Secondary | ICD-10-CM | POA: Diagnosis not present

## 2018-05-05 DIAGNOSIS — N186 End stage renal disease: Secondary | ICD-10-CM | POA: Diagnosis not present

## 2018-05-05 DIAGNOSIS — D631 Anemia in chronic kidney disease: Secondary | ICD-10-CM | POA: Diagnosis not present

## 2018-05-05 DIAGNOSIS — D509 Iron deficiency anemia, unspecified: Secondary | ICD-10-CM | POA: Diagnosis not present

## 2018-05-05 DIAGNOSIS — N2581 Secondary hyperparathyroidism of renal origin: Secondary | ICD-10-CM | POA: Diagnosis not present

## 2018-05-07 DIAGNOSIS — N186 End stage renal disease: Secondary | ICD-10-CM | POA: Diagnosis not present

## 2018-05-07 DIAGNOSIS — D631 Anemia in chronic kidney disease: Secondary | ICD-10-CM | POA: Diagnosis not present

## 2018-05-07 DIAGNOSIS — N2581 Secondary hyperparathyroidism of renal origin: Secondary | ICD-10-CM | POA: Diagnosis not present

## 2018-05-07 DIAGNOSIS — D509 Iron deficiency anemia, unspecified: Secondary | ICD-10-CM | POA: Diagnosis not present

## 2018-05-10 DIAGNOSIS — D631 Anemia in chronic kidney disease: Secondary | ICD-10-CM | POA: Diagnosis not present

## 2018-05-10 DIAGNOSIS — D509 Iron deficiency anemia, unspecified: Secondary | ICD-10-CM | POA: Diagnosis not present

## 2018-05-10 DIAGNOSIS — N2581 Secondary hyperparathyroidism of renal origin: Secondary | ICD-10-CM | POA: Diagnosis not present

## 2018-05-10 DIAGNOSIS — N186 End stage renal disease: Secondary | ICD-10-CM | POA: Diagnosis not present

## 2018-05-11 ENCOUNTER — Encounter: Payer: Self-pay | Admitting: Family Medicine

## 2018-05-11 ENCOUNTER — Ambulatory Visit (INDEPENDENT_AMBULATORY_CARE_PROVIDER_SITE_OTHER): Payer: Medicare Other | Admitting: Family Medicine

## 2018-05-11 VITALS — BP 140/88 | HR 71 | Temp 98.1°F | Resp 16 | Ht 65.0 in | Wt 243.0 lb

## 2018-05-11 DIAGNOSIS — I129 Hypertensive chronic kidney disease with stage 1 through stage 4 chronic kidney disease, or unspecified chronic kidney disease: Secondary | ICD-10-CM

## 2018-05-11 DIAGNOSIS — R1084 Generalized abdominal pain: Secondary | ICD-10-CM

## 2018-05-11 DIAGNOSIS — N189 Chronic kidney disease, unspecified: Secondary | ICD-10-CM | POA: Diagnosis not present

## 2018-05-11 DIAGNOSIS — K5900 Constipation, unspecified: Secondary | ICD-10-CM

## 2018-05-11 DIAGNOSIS — Z0000 Encounter for general adult medical examination without abnormal findings: Secondary | ICD-10-CM

## 2018-05-11 NOTE — Patient Instructions (Addendum)
A few things to remember from today's visit:   Medicare annual wellness visit, subsequent  Hypertension with renal disease  Constipation, unspecified constipation type  Abdominal pain, diffuse So for now we will continue Linzess every  2 days Adequate hydration. Try to increase protein intake due to dialysis. Amlodipine 2.5 mg daily in the morning. No changes in losartan or carvedilol.  Continue monitoring BP at home.   A few tips:  -As we age balance is not as good as it was, so there is a higher risks for falls. Please remove small rugs and furniture that is "in your way" and could increase the risk of falls. Stretching exercises may help with fall prevention: Yoga and Tai Chi are some examples. Low impact exercise is better, so you are not very achy the next day.  -Sun screen and avoidance of direct sun light recommended. Caution with dehydration, if working outdoors be sure to drink enough fluids.  - Some medications are not safe as we age, increases the risk of side effects and can potentially interact with other medication you are also taken;  including some of over the counter medications. Be sure to let me know when you start a new medication even if it is a dietary/vitamin supplement.   -Healthy diet low in red meet/animal fat and sugar + regular physical activity is recommended.       Screening schedule for the next 5-10 years:  Colonoscopy  Glaucoma screening/eye exam every 1-2 years.  Mammogram for breast cancer screening annually.  Flu vaccine annually.  Diabetes and cholesterol screening   Fall prevention

## 2018-05-11 NOTE — Progress Notes (Signed)
HPI:   Jane [name] is a 72 y.o. female, who is here today to follow on recent OV, 2 months f/u and Medicare wellness visit.   Since her last visit she has follow with Dr. Sharol [name]. She was last seen on 03/09/2018, when she was complaining about diffuse abdominal pain.  She was also referred to ortho due to worsening right knee pain.  Constipation: Linzess caused diarrhea,so recommend Amitiza. It seems like the medicine is not covered by her insurance. MiraLAX alone usually does not help, she has to double dose. She went back to Jane [name] but taking it every other day. Abdominal pain improved, she feels much better after defecation. Lasix causes diarrhea, usually she has 2-3 stools same day but no problems next day.  Hypertension: According to patient, amlodipine was discontinued by nephrologist because she was having low BPs after dialysis, 80s to 90s/50s. For the past few days she has had elevated BPs at home, max 190/110, so she will resume amlodipine 5 mg daily.  Denies severe/frequent headache, visual changes, chest pain, dyspnea, palpitation,focal weakness, or edema.   Medicare wellness visit  She lives alone Independent ADL's except for transfer, she has unstable gait and she has a walker at home but hardly ever use it. Most IADL's independent.  She does not try, her daughter takes her to the grocery store and shopping if she needs to. She uses the scat bus to keep her doctor's appointments.  Colonoscopy 09/2015. Mammogram 04/2017.  Functional Status Survey: Is the patient deaf or have difficulty hearing?: No Does the patient have difficulty seeing, even when wearing glasses/contacts?: Yes Does the patient have difficulty concentrating, remembering, or making decisions?: No("sometimes" but not frequent.) Does the patient have difficulty walking or climbing stairs?: Yes(Back pain ad OA) Does the patient have difficulty dressing or bathing?: No Does the patient have  difficulty doing errands alone such as visiting a doctor's office or shopping?: Yes(She doe snot drive becasue vision issues.)  Fall Risk  05/11/2018 10/16/2016  Falls in the past year? Yes Yes  Comment - post dialysis   Number falls in past yr: 2 or more 1  Injury with Fall? No -  Risk for fall due to : Impaired balance/gait;Impaired vision;History of fall(s) -  Follow up Falls evaluation completed;Falls prevention discussed Education provided   Hx of ESRD on dialysis,atrial fib on coumadin,HTN,OA,lumbo-sacral spondylosis without myelopathy.  She does not exercise regularly due to pain. She has not been consistent with a healthy diet.  She does not like meet,chicken,or fish. She is trying to drink protein supplementation daily and eats cheeses a few times per day.  Providers she sees regularly:  Eye care provider: She does not remember name. Cardiologist ,Dr Jane [name]. She also follows with coumadin clinic. Chronic pain, Dr Jane [name]. Jane [name].  Depression screen PHQ 2/9 05/11/2018  Decreased Interest 0  Down, Depressed, Hopeless 0  PHQ - 2 Score 0   She is involved with her church [employer] - 05/11/18 1350    Normal clock drawing test?  yes    How many words correct?  3       Vision Screening Comments: Refused. SCAT was here to pick her up.  Review of Systems  Constitutional: Positive for fatigue. Negative for activity change, appetite change, fever and unexpected weight change.  HENT: Negative for mouth sores, nosebleeds and trouble swallowing.   Eyes: Negative for redness and visual disturbance.  Respiratory: Negative for cough, shortness of breath and wheezing.  Cardiovascular: Negative for chest pain and leg swelling.  Gastrointestinal: Positive for constipation. Negative for abdominal pain, blood in stool, nausea and vomiting.  Genitourinary: Negative for dysuria and hematuria.  Musculoskeletal: Positive for arthralgias, back pain and gait problem.  Skin: Negative for  rash and wound.  Neurological: Negative for syncope, weakness and headaches.  Psychiatric/Behavioral: Negative for confusion. The patient is nervous/anxious.       Current Outpatient Medications on File Prior to Visit  Medication Sig Dispense Refill  . albuterol (PROVENTIL HFA;VENTOLIN HFA) 108 (90 Base) MCG/ACT inhaler Inhale 1 puff into the lungs every 6 (six) hours as needed for wheezing or shortness of breath. 6.7 g 2  . Amino Acids-Protein Hydrolys (FEEDING SUPPLEMENT, PRO-STAT SUGAR FREE 64,) LIQD Take 30 mLs by mouth 2 (two) times daily. 900 mL 0  . amiodarone (PACERONE) 200 MG tablet TAKE 1 TABLET BY MOUTH ONCE DAILY 90 tablet 1  . amLODipine (NORVASC) 5 MG tablet     . b complex vitamins capsule Take by mouth.    . calcium carbonate (TUMS - DOSED IN MG ELEMENTAL CALCIUM) 500 MG chewable tablet Chew 2 tablets by mouth 3 (three) times daily with meals.    . carvedilol (COREG) 25 MG tablet TAKE 2 TABLETS BY MOUTH TWICE DAILY WITH A MEAL 360 tablet 2  . cinacalcet (SENSIPAR) 30 MG tablet Take 30 mg by mouth daily.    . diclofenac sodium (VOLTAREN) 1 % GEL Apply 4 g topically 4 (four) times daily. 4 Tube 3  . Eflornithine HCl 13.9 % cream Apply 1 application topically 2 (two) times daily. At least 8 hours apart. 45 g 1  . ethyl chloride spray USE 1 TO 2 SPRAYS TO DIALYSIS ACCESS EVERY TREATMENT  12  . losartan (COZAAR) 50 MG tablet Take 1 tablet (50 mg total) by mouth daily. 90 tablet 1  . lubiprostone (AMITIZA) 8 MCG capsule Take 1 capsule (8 mcg total) by mouth daily with breakfast. 30 capsule 1  . multivitamin (RENA-VIT) TABS tablet Take 1 tablet by mouth daily.    . sevelamer carbonate (RENVELA) 800 MG tablet Take 800-1,600 mg by mouth See admin instructions. 1,600 mg after meals TWO times a day and 800 mg after each snack    . warfarin (COUMADIN) 5 MG tablet TAKE AS DIRECTED 50 tablet 3   No current facility-administered medications on file prior to visit.      Past Medical  History:  Diagnosis Date  . Acid reflux   . Atrial fibrillation ([place])   . Hypertension   . Renal disorder   . Sleep apnea    Allergies  Allergen Reactions  . Latex Rash  . Other Other (See Comments)    The plastic tape irritates her skin.  Silicone tapes cause redness  . Penicillins Rash  . Sulfa Antibiotics Rash  . Tape Other (See Comments)    PLASTIC TAPE TEARS OFF THE SKIN AND BRUISES IT TERRIBLY!! GETS ITCHY AND RASH FROM PAPER AND PLASTIC TAPES PLASTIC TAPE TEARS OFF THE SKIN AND BRUISES IT TERRIBLY!! PLASTIC TAPE TEARS OFF THE SKIN AND BRUISES IT TERRIBLY!!    Social History   Socioeconomic History  . Marital status: Single    Spouse name: Not on file  . Number of children: Not on file  . Years of education: Not on file  . Highest education level: Not on file  Occupational History  . Not on file  Social Needs  . Financial resource strain: Not on file  .  Food insecurity:    Worry: Not on file    Inability: Not on file  . Transportation needs:    Medical: Not on file    Non-medical: Not on file  Tobacco Use  . Smoking status: Never Smoker  . Smokeless tobacco: Never Used  Substance and Sexual Activity  . Alcohol use: No  . Drug use: No  . Sexual activity: Never  Lifestyle  . Physical activity:    Days per week: Not on file    Minutes per session: Not on file  . Stress: Not on file  Relationships  . Social connections:    Talks on phone: Not on file    Gets together: Not on file    Attends religious service: Not on file    Active member of club or organization: Not on file    Attends meetings of clubs or organizations: Not on file    Relationship status: Not on file  Other Topics Concern  . Not on file  Social History Narrative  . Not on file    Vitals:   05/11/18 1126  BP: 140/88  Pulse: 71  Resp: 16  Temp: 98.1 F (36.7 C)  SpO2: 97%   Body mass index is 40.44 kg/m.    Physical Exam  Nursing note and vitals  reviewed. Constitutional: She is oriented to person, place, and time. She appears well-developed. No distress.  HENT:  Head: Normocephalic and atraumatic.  Mouth/Throat: Oropharynx is clear and moist and mucous membranes are normal.  Eyes: Pupils are equal, round, and reactive to light. Conjunctivae are normal.  Cardiovascular: Normal rate. An irregular rhythm present.  Murmur (Soft SEM RUSB) heard. Pulses:      Dorsalis pedis pulses are 2+ on the right side, and 2+ on the left side.  Respiratory: Effort normal and breath sounds normal. No respiratory distress.  GI: Soft. She exhibits no mass. There is no hepatomegaly. There is no tenderness.  Musculoskeletal: She exhibits no edema.  Lymphadenopathy:    She has no cervical adenopathy.  Neurological: She is alert and oriented to person, place, and time. She has normal strength. No cranial nerve deficit.  Mildly unstable gait, she has no assistance today.  Skin: Skin is warm. No rash noted. No erythema.  Psychiatric: She has a normal mood and affect.  Well groomed, good eye contact.    ASSESSMENT AND PLAN:  Ms. Kessa was seen today for follow-up.  Diagnoses and all orders for this visit:  Medicare annual wellness visit, subsequent We discussed the importance of staying active, physically and mentally, as well as the benefits of a healthy/balnace diet. Low impact exercise that involve stretching and strengthing are ideal. Vaccines up to date. We discussed preventive screening for the next 5-10 years, summery of recommendations [name] in AVS : Colonoscopy in 2020. Eye exam and glaucoma screening annually. Mammogram appt 04/2018.. Fall prevention.  Advance directives and end of life discussed, she has living will,POA,and advance directives.   Constipation, unspecified constipation type Continue Linzess q 2 days and Miralax daily prn. Adequate fiber and fluid intake. Instructed about warning signs.  Abdominal pain,  diffuse Resolved. Most likely related to constipation. Instructed about warning signs.   Hypertension with renal disease BP here in the office adequate control. Recommend taking amlodipine 2.5 mg daily (instead 5 mg). No changes in Cozaar 50 mg. Continue monitoring BP at home. Low-salt diet also to continue. She has BP check 3 times per week before and after dialysis. I will  see her back in 6 months.      [name]. [name], MD  [facility]. [place] office.

## 2018-05-11 NOTE — Assessment & Plan Note (Signed)
BP here in the office adequate control. Recommend taking amlodipine 2.5 mg daily (instead 5 mg). No changes in Cozaar 50 mg. Continue monitoring BP at home. Low-salt diet also to continue. She has BP check 3 times per week before and after dialysis. I will see her back in 6 months.

## 2018-05-12 DIAGNOSIS — N2581 Secondary hyperparathyroidism of renal origin: Secondary | ICD-10-CM | POA: Diagnosis not present

## 2018-05-12 DIAGNOSIS — D509 Iron deficiency anemia, unspecified: Secondary | ICD-10-CM | POA: Diagnosis not present

## 2018-05-12 DIAGNOSIS — N186 End stage renal disease: Secondary | ICD-10-CM | POA: Diagnosis not present

## 2018-05-12 DIAGNOSIS — D631 Anemia in chronic kidney disease: Secondary | ICD-10-CM | POA: Diagnosis not present

## 2018-05-13 DIAGNOSIS — N2581 Secondary hyperparathyroidism of renal origin: Secondary | ICD-10-CM | POA: Diagnosis not present

## 2018-05-13 DIAGNOSIS — D509 Iron deficiency anemia, unspecified: Secondary | ICD-10-CM | POA: Diagnosis not present

## 2018-05-13 DIAGNOSIS — D631 Anemia in chronic kidney disease: Secondary | ICD-10-CM | POA: Diagnosis not present

## 2018-05-13 DIAGNOSIS — N186 End stage renal disease: Secondary | ICD-10-CM | POA: Diagnosis not present

## 2018-05-14 DIAGNOSIS — Z1231 Encounter for screening mammogram for malignant neoplasm of breast: Secondary | ICD-10-CM | POA: Diagnosis not present

## 2018-05-14 LAB — HM MAMMOGRAPHY

## 2018-05-17 DIAGNOSIS — N186 End stage renal disease: Secondary | ICD-10-CM | POA: Diagnosis not present

## 2018-05-17 DIAGNOSIS — N2581 Secondary hyperparathyroidism of renal origin: Secondary | ICD-10-CM | POA: Diagnosis not present

## 2018-05-17 DIAGNOSIS — D509 Iron deficiency anemia, unspecified: Secondary | ICD-10-CM | POA: Diagnosis not present

## 2018-05-17 DIAGNOSIS — D631 Anemia in chronic kidney disease: Secondary | ICD-10-CM | POA: Diagnosis not present

## 2018-05-19 DIAGNOSIS — N2581 Secondary hyperparathyroidism of renal origin: Secondary | ICD-10-CM | POA: Diagnosis not present

## 2018-05-19 DIAGNOSIS — D631 Anemia in chronic kidney disease: Secondary | ICD-10-CM | POA: Diagnosis not present

## 2018-05-19 DIAGNOSIS — D509 Iron deficiency anemia, unspecified: Secondary | ICD-10-CM | POA: Diagnosis not present

## 2018-05-19 DIAGNOSIS — N186 End stage renal disease: Secondary | ICD-10-CM | POA: Diagnosis not present

## 2018-05-20 DIAGNOSIS — Z79899 Other long term (current) drug therapy: Secondary | ICD-10-CM | POA: Diagnosis not present

## 2018-05-20 DIAGNOSIS — M961 Postlaminectomy syndrome, not elsewhere classified: Secondary | ICD-10-CM | POA: Diagnosis not present

## 2018-05-20 DIAGNOSIS — M25561 Pain in right knee: Secondary | ICD-10-CM | POA: Diagnosis not present

## 2018-05-20 DIAGNOSIS — M47816 Spondylosis without myelopathy or radiculopathy, lumbar region: Secondary | ICD-10-CM | POA: Diagnosis not present

## 2018-05-20 DIAGNOSIS — Z5181 Encounter for therapeutic drug level monitoring: Secondary | ICD-10-CM | POA: Diagnosis not present

## 2018-05-20 DIAGNOSIS — M47817 Spondylosis without myelopathy or radiculopathy, lumbosacral region: Secondary | ICD-10-CM | POA: Diagnosis not present

## 2018-05-21 DIAGNOSIS — I158 Other secondary hypertension: Secondary | ICD-10-CM | POA: Diagnosis not present

## 2018-05-21 DIAGNOSIS — D509 Iron deficiency anemia, unspecified: Secondary | ICD-10-CM | POA: Diagnosis not present

## 2018-05-21 DIAGNOSIS — Z992 Dependence on renal dialysis: Secondary | ICD-10-CM | POA: Diagnosis not present

## 2018-05-21 DIAGNOSIS — N186 End stage renal disease: Secondary | ICD-10-CM | POA: Diagnosis not present

## 2018-05-21 DIAGNOSIS — N2581 Secondary hyperparathyroidism of renal origin: Secondary | ICD-10-CM | POA: Diagnosis not present

## 2018-05-24 ENCOUNTER — Encounter: Payer: Self-pay | Admitting: Family Medicine

## 2018-05-24 DIAGNOSIS — D509 Iron deficiency anemia, unspecified: Secondary | ICD-10-CM | POA: Diagnosis not present

## 2018-05-24 DIAGNOSIS — N2581 Secondary hyperparathyroidism of renal origin: Secondary | ICD-10-CM | POA: Diagnosis not present

## 2018-05-24 DIAGNOSIS — N186 End stage renal disease: Secondary | ICD-10-CM | POA: Diagnosis not present

## 2018-05-25 DIAGNOSIS — Z7901 Long term (current) use of anticoagulants: Secondary | ICD-10-CM | POA: Diagnosis not present

## 2018-05-26 DIAGNOSIS — N186 End stage renal disease: Secondary | ICD-10-CM | POA: Diagnosis not present

## 2018-05-26 DIAGNOSIS — N2581 Secondary hyperparathyroidism of renal origin: Secondary | ICD-10-CM | POA: Diagnosis not present

## 2018-05-26 DIAGNOSIS — D509 Iron deficiency anemia, unspecified: Secondary | ICD-10-CM | POA: Diagnosis not present

## 2018-05-28 DIAGNOSIS — N186 End stage renal disease: Secondary | ICD-10-CM | POA: Diagnosis not present

## 2018-05-28 DIAGNOSIS — D509 Iron deficiency anemia, unspecified: Secondary | ICD-10-CM | POA: Diagnosis not present

## 2018-05-28 DIAGNOSIS — N2581 Secondary hyperparathyroidism of renal origin: Secondary | ICD-10-CM | POA: Diagnosis not present

## 2018-05-31 DIAGNOSIS — D509 Iron deficiency anemia, unspecified: Secondary | ICD-10-CM | POA: Diagnosis not present

## 2018-05-31 DIAGNOSIS — N2581 Secondary hyperparathyroidism of renal origin: Secondary | ICD-10-CM | POA: Diagnosis not present

## 2018-05-31 DIAGNOSIS — N186 End stage renal disease: Secondary | ICD-10-CM | POA: Diagnosis not present

## 2018-06-01 DIAGNOSIS — Z882 Allergy status to sulfonamides status: Secondary | ICD-10-CM | POA: Diagnosis not present

## 2018-06-01 DIAGNOSIS — G4733 Obstructive sleep apnea (adult) (pediatric): Secondary | ICD-10-CM | POA: Diagnosis not present

## 2018-06-01 DIAGNOSIS — M47817 Spondylosis without myelopathy or radiculopathy, lumbosacral region: Secondary | ICD-10-CM | POA: Diagnosis not present

## 2018-06-01 DIAGNOSIS — Z880 Allergy status to penicillin: Secondary | ICD-10-CM | POA: Diagnosis not present

## 2018-06-01 DIAGNOSIS — Z91040 Latex allergy status: Secondary | ICD-10-CM | POA: Diagnosis not present

## 2018-06-01 DIAGNOSIS — Z992 Dependence on renal dialysis: Secondary | ICD-10-CM | POA: Diagnosis not present

## 2018-06-01 DIAGNOSIS — I4891 Unspecified atrial fibrillation: Secondary | ICD-10-CM | POA: Diagnosis not present

## 2018-06-01 DIAGNOSIS — I10 Essential (primary) hypertension: Secondary | ICD-10-CM | POA: Diagnosis not present

## 2018-06-01 DIAGNOSIS — J45909 Unspecified asthma, uncomplicated: Secondary | ICD-10-CM | POA: Diagnosis not present

## 2018-06-01 DIAGNOSIS — N19 Unspecified kidney failure: Secondary | ICD-10-CM | POA: Diagnosis not present

## 2018-06-01 DIAGNOSIS — K219 Gastro-esophageal reflux disease without esophagitis: Secondary | ICD-10-CM | POA: Diagnosis not present

## 2018-06-01 DIAGNOSIS — Q892 Congenital malformations of other endocrine glands: Secondary | ICD-10-CM | POA: Diagnosis not present

## 2018-06-01 DIAGNOSIS — Z888 Allergy status to other drugs, medicaments and biological substances status: Secondary | ICD-10-CM | POA: Diagnosis not present

## 2018-06-02 DIAGNOSIS — N186 End stage renal disease: Secondary | ICD-10-CM | POA: Diagnosis not present

## 2018-06-02 DIAGNOSIS — N2581 Secondary hyperparathyroidism of renal origin: Secondary | ICD-10-CM | POA: Diagnosis not present

## 2018-06-02 DIAGNOSIS — D509 Iron deficiency anemia, unspecified: Secondary | ICD-10-CM | POA: Diagnosis not present

## 2018-06-03 DIAGNOSIS — I480 Paroxysmal atrial fibrillation: Secondary | ICD-10-CM | POA: Diagnosis not present

## 2018-06-03 DIAGNOSIS — Z992 Dependence on renal dialysis: Secondary | ICD-10-CM | POA: Diagnosis not present

## 2018-06-03 DIAGNOSIS — Z7901 Long term (current) use of anticoagulants: Secondary | ICD-10-CM | POA: Diagnosis not present

## 2018-06-03 DIAGNOSIS — I10 Essential (primary) hypertension: Secondary | ICD-10-CM | POA: Diagnosis not present

## 2018-06-03 DIAGNOSIS — N186 End stage renal disease: Secondary | ICD-10-CM | POA: Diagnosis not present

## 2018-06-04 DIAGNOSIS — N186 End stage renal disease: Secondary | ICD-10-CM | POA: Diagnosis not present

## 2018-06-04 DIAGNOSIS — N2581 Secondary hyperparathyroidism of renal origin: Secondary | ICD-10-CM | POA: Diagnosis not present

## 2018-06-04 DIAGNOSIS — D509 Iron deficiency anemia, unspecified: Secondary | ICD-10-CM | POA: Diagnosis not present

## 2018-06-07 DIAGNOSIS — N186 End stage renal disease: Secondary | ICD-10-CM | POA: Diagnosis not present

## 2018-06-07 DIAGNOSIS — D509 Iron deficiency anemia, unspecified: Secondary | ICD-10-CM | POA: Diagnosis not present

## 2018-06-07 DIAGNOSIS — N2581 Secondary hyperparathyroidism of renal origin: Secondary | ICD-10-CM | POA: Diagnosis not present

## 2018-06-09 ENCOUNTER — Other Ambulatory Visit: Payer: Self-pay

## 2018-06-09 DIAGNOSIS — N186 End stage renal disease: Secondary | ICD-10-CM | POA: Diagnosis not present

## 2018-06-09 DIAGNOSIS — D509 Iron deficiency anemia, unspecified: Secondary | ICD-10-CM | POA: Diagnosis not present

## 2018-06-09 DIAGNOSIS — N2581 Secondary hyperparathyroidism of renal origin: Secondary | ICD-10-CM | POA: Diagnosis not present

## 2018-06-10 DIAGNOSIS — Z7901 Long term (current) use of anticoagulants: Secondary | ICD-10-CM | POA: Diagnosis not present

## 2018-06-10 DIAGNOSIS — I480 Paroxysmal atrial fibrillation: Secondary | ICD-10-CM | POA: Diagnosis not present

## 2018-06-11 DIAGNOSIS — N2581 Secondary hyperparathyroidism of renal origin: Secondary | ICD-10-CM | POA: Diagnosis not present

## 2018-06-11 DIAGNOSIS — N186 End stage renal disease: Secondary | ICD-10-CM | POA: Diagnosis not present

## 2018-06-11 DIAGNOSIS — D509 Iron deficiency anemia, unspecified: Secondary | ICD-10-CM | POA: Diagnosis not present

## 2018-06-13 DIAGNOSIS — N186 End stage renal disease: Secondary | ICD-10-CM | POA: Diagnosis not present

## 2018-06-13 DIAGNOSIS — N2581 Secondary hyperparathyroidism of renal origin: Secondary | ICD-10-CM | POA: Diagnosis not present

## 2018-06-13 DIAGNOSIS — D509 Iron deficiency anemia, unspecified: Secondary | ICD-10-CM | POA: Diagnosis not present

## 2018-06-15 DIAGNOSIS — D509 Iron deficiency anemia, unspecified: Secondary | ICD-10-CM | POA: Diagnosis not present

## 2018-06-15 DIAGNOSIS — N2581 Secondary hyperparathyroidism of renal origin: Secondary | ICD-10-CM | POA: Diagnosis not present

## 2018-06-15 DIAGNOSIS — N186 End stage renal disease: Secondary | ICD-10-CM | POA: Diagnosis not present

## 2018-06-18 DIAGNOSIS — D509 Iron deficiency anemia, unspecified: Secondary | ICD-10-CM | POA: Diagnosis not present

## 2018-06-18 DIAGNOSIS — N186 End stage renal disease: Secondary | ICD-10-CM | POA: Diagnosis not present

## 2018-06-18 DIAGNOSIS — N2581 Secondary hyperparathyroidism of renal origin: Secondary | ICD-10-CM | POA: Diagnosis not present

## 2018-06-20 DIAGNOSIS — N186 End stage renal disease: Secondary | ICD-10-CM | POA: Diagnosis not present

## 2018-06-20 DIAGNOSIS — Z992 Dependence on renal dialysis: Secondary | ICD-10-CM | POA: Diagnosis not present

## 2018-06-20 DIAGNOSIS — I158 Other secondary hypertension: Secondary | ICD-10-CM | POA: Diagnosis not present

## 2018-06-21 DIAGNOSIS — N2581 Secondary hyperparathyroidism of renal origin: Secondary | ICD-10-CM | POA: Diagnosis not present

## 2018-06-21 DIAGNOSIS — N186 End stage renal disease: Secondary | ICD-10-CM | POA: Diagnosis not present

## 2018-06-22 DIAGNOSIS — Z7901 Long term (current) use of anticoagulants: Secondary | ICD-10-CM | POA: Diagnosis not present

## 2018-06-23 DIAGNOSIS — N186 End stage renal disease: Secondary | ICD-10-CM | POA: Diagnosis not present

## 2018-06-23 DIAGNOSIS — N2581 Secondary hyperparathyroidism of renal origin: Secondary | ICD-10-CM | POA: Diagnosis not present

## 2018-06-25 ENCOUNTER — Other Ambulatory Visit: Payer: Self-pay | Admitting: Family Medicine

## 2018-06-25 DIAGNOSIS — N2581 Secondary hyperparathyroidism of renal origin: Secondary | ICD-10-CM | POA: Diagnosis not present

## 2018-06-25 DIAGNOSIS — N186 End stage renal disease: Secondary | ICD-10-CM | POA: Diagnosis not present

## 2018-06-25 DIAGNOSIS — I480 Paroxysmal atrial fibrillation: Secondary | ICD-10-CM

## 2018-06-28 DIAGNOSIS — N186 End stage renal disease: Secondary | ICD-10-CM | POA: Diagnosis not present

## 2018-06-28 DIAGNOSIS — N2581 Secondary hyperparathyroidism of renal origin: Secondary | ICD-10-CM | POA: Diagnosis not present

## 2018-06-30 DIAGNOSIS — N2581 Secondary hyperparathyroidism of renal origin: Secondary | ICD-10-CM | POA: Diagnosis not present

## 2018-06-30 DIAGNOSIS — N186 End stage renal disease: Secondary | ICD-10-CM | POA: Diagnosis not present

## 2018-07-02 DIAGNOSIS — N186 End stage renal disease: Secondary | ICD-10-CM | POA: Diagnosis not present

## 2018-07-02 DIAGNOSIS — N2581 Secondary hyperparathyroidism of renal origin: Secondary | ICD-10-CM | POA: Diagnosis not present

## 2018-07-05 DIAGNOSIS — N2581 Secondary hyperparathyroidism of renal origin: Secondary | ICD-10-CM | POA: Diagnosis not present

## 2018-07-05 DIAGNOSIS — N186 End stage renal disease: Secondary | ICD-10-CM | POA: Diagnosis not present

## 2018-07-07 DIAGNOSIS — N2581 Secondary hyperparathyroidism of renal origin: Secondary | ICD-10-CM | POA: Diagnosis not present

## 2018-07-07 DIAGNOSIS — N186 End stage renal disease: Secondary | ICD-10-CM | POA: Diagnosis not present

## 2018-07-09 DIAGNOSIS — N2581 Secondary hyperparathyroidism of renal origin: Secondary | ICD-10-CM | POA: Diagnosis not present

## 2018-07-09 DIAGNOSIS — N186 End stage renal disease: Secondary | ICD-10-CM | POA: Diagnosis not present

## 2018-07-11 DIAGNOSIS — N2581 Secondary hyperparathyroidism of renal origin: Secondary | ICD-10-CM | POA: Diagnosis not present

## 2018-07-11 DIAGNOSIS — N186 End stage renal disease: Secondary | ICD-10-CM | POA: Diagnosis not present

## 2018-07-13 DIAGNOSIS — N186 End stage renal disease: Secondary | ICD-10-CM | POA: Diagnosis not present

## 2018-07-13 DIAGNOSIS — N2581 Secondary hyperparathyroidism of renal origin: Secondary | ICD-10-CM | POA: Diagnosis not present

## 2018-07-16 DIAGNOSIS — N2581 Secondary hyperparathyroidism of renal origin: Secondary | ICD-10-CM | POA: Diagnosis not present

## 2018-07-16 DIAGNOSIS — N186 End stage renal disease: Secondary | ICD-10-CM | POA: Diagnosis not present

## 2018-07-18 DIAGNOSIS — N186 End stage renal disease: Secondary | ICD-10-CM | POA: Diagnosis not present

## 2018-07-18 DIAGNOSIS — N2581 Secondary hyperparathyroidism of renal origin: Secondary | ICD-10-CM | POA: Diagnosis not present

## 2018-07-19 ENCOUNTER — Ambulatory Visit: Payer: Self-pay

## 2018-07-19 NOTE — Telephone Encounter (Signed)
Outgoing call to Patient who complains of her blood pressure fluctuating Patient states this am when she got up, it was 109/30  During time of triage it was, 109/ 58 HR 56 153/86 HR 80.   Yesterday  patient states it was 80/ 52 HR 58.  Stayed in bed all day. Blood pressure were obtained via automatic machines.  Patient states that she takes carvedilol 25mg  and lasartan 50mg .  Restricted on water intake related to dialysis. Patient scheduled for appointment with   Dr.  [name] @ [facility].  On 07/27/2018 @ 0700 am. Patient voiced understanding.  Reviewed care advice with Patient.  Voiced understanding.    Reason for Disposition . [7] Systolic BP 61-607 AND [3] taking blood pressure medications AND [3] NOT dizzy, lightheaded or weak  Answer Assessment - Initial Assessment Questions 1. BLOOD PRESSURE: "What is the blood pressure?" "Did you take at least two measurements 5 minutes apart?"     9: 45 109 /58  Hr 56  153/ 86 80 2. ONSET: "When did you take your blood pressure?"     This am  3. HOW: "How did you obtain the blood pressure?" (e.g., visiting nurse, automatic home BP monitor)     Automatic machine 4. HISTORY: "Do you have a history of low blood pressure?" "What is your blood pressure normally?"     High blood pressure 5. MEDICATIONS: "Are you taking any medications for blood pressure?" If yes: "Have they been changed recently?"     Carvedilol, lasartan 6. PULSE RATE: "Do you know what your pulse rate is?"      *No Answer* 7. OTHER SYMPTOMS: "Have you been sick recently?" "Have you had a recent injury?"     Tired get dizzy when walking at times 8. PREGNANCY: "Is there any chance you are pregnant?" "When was your last menstrual period?"     na  Protocols used: LOW BLOOD PRESSURE-A-AH

## 2018-07-20 DIAGNOSIS — N186 End stage renal disease: Secondary | ICD-10-CM | POA: Diagnosis not present

## 2018-07-20 DIAGNOSIS — N2581 Secondary hyperparathyroidism of renal origin: Secondary | ICD-10-CM | POA: Diagnosis not present

## 2018-07-21 DIAGNOSIS — N186 End stage renal disease: Secondary | ICD-10-CM | POA: Diagnosis not present

## 2018-07-21 DIAGNOSIS — Z992 Dependence on renal dialysis: Secondary | ICD-10-CM | POA: Diagnosis not present

## 2018-07-21 DIAGNOSIS — I158 Other secondary hypertension: Secondary | ICD-10-CM | POA: Diagnosis not present

## 2018-07-23 DIAGNOSIS — D631 Anemia in chronic kidney disease: Secondary | ICD-10-CM | POA: Diagnosis not present

## 2018-07-23 DIAGNOSIS — N2581 Secondary hyperparathyroidism of renal origin: Secondary | ICD-10-CM | POA: Diagnosis not present

## 2018-07-23 DIAGNOSIS — N186 End stage renal disease: Secondary | ICD-10-CM | POA: Diagnosis not present

## 2018-07-24 ENCOUNTER — Other Ambulatory Visit: Payer: Self-pay | Admitting: Family Medicine

## 2018-07-24 DIAGNOSIS — I129 Hypertensive chronic kidney disease with stage 1 through stage 4 chronic kidney disease, or unspecified chronic kidney disease: Secondary | ICD-10-CM

## 2018-07-26 ENCOUNTER — Ambulatory Visit: Payer: Medicare Other | Admitting: Family Medicine

## 2018-07-26 DIAGNOSIS — D631 Anemia in chronic kidney disease: Secondary | ICD-10-CM | POA: Diagnosis not present

## 2018-07-26 DIAGNOSIS — N186 End stage renal disease: Secondary | ICD-10-CM | POA: Diagnosis not present

## 2018-07-26 DIAGNOSIS — N2581 Secondary hyperparathyroidism of renal origin: Secondary | ICD-10-CM | POA: Diagnosis not present

## 2018-07-27 ENCOUNTER — Encounter: Payer: Self-pay | Admitting: Family Medicine

## 2018-07-27 ENCOUNTER — Ambulatory Visit (INDEPENDENT_AMBULATORY_CARE_PROVIDER_SITE_OTHER): Payer: Medicare Other | Admitting: Family Medicine

## 2018-07-27 VITALS — BP 140/70 | HR 72 | Temp 98.1°F | Resp 12 | Ht 65.0 in | Wt 243.1 lb

## 2018-07-27 DIAGNOSIS — K5900 Constipation, unspecified: Secondary | ICD-10-CM | POA: Diagnosis not present

## 2018-07-27 DIAGNOSIS — R1084 Generalized abdominal pain: Secondary | ICD-10-CM | POA: Diagnosis not present

## 2018-07-27 DIAGNOSIS — I480 Paroxysmal atrial fibrillation: Secondary | ICD-10-CM | POA: Diagnosis not present

## 2018-07-27 DIAGNOSIS — K219 Gastro-esophageal reflux disease without esophagitis: Secondary | ICD-10-CM

## 2018-07-27 DIAGNOSIS — I129 Hypertensive chronic kidney disease with stage 1 through stage 4 chronic kidney disease, or unspecified chronic kidney disease: Secondary | ICD-10-CM | POA: Diagnosis not present

## 2018-07-27 DIAGNOSIS — D638 Anemia in other chronic diseases classified elsewhere: Secondary | ICD-10-CM

## 2018-07-27 MED ORDER — OMEPRAZOLE 20 MG PO CPDR: 20.0000 mg | DELAYED_RELEASE_CAPSULE | Freq: Every day | ORAL | 1 refills | Status: DC

## 2018-07-27 MED ORDER — LINACLOTIDE 145 MCG PO CAPS: 145.0000 ug | ORAL_CAPSULE | Freq: Every day | ORAL | 2 refills | Status: DC

## 2018-07-27 MED ORDER — CARVEDILOL 25 MG PO TABS: 12.5000 mg | ORAL_TABLET | Freq: Two times a day (BID) | ORAL | 0 refills | Status: DC

## 2018-07-27 NOTE — Assessment & Plan Note (Signed)
It seems to be related to constipation. No pain elicited during examination. I do not think imaging or further work-up are needed today. Instructed about warning signs.

## 2018-07-27 NOTE — Assessment & Plan Note (Signed)
Omeprazole 20 mg daily before breakfast. GERD precautions also recommended. Follow-up in 4 weeks.

## 2018-07-27 NOTE — Assessment & Plan Note (Signed)
Problem is not well controlled with current dose of Linzess, so increased from 72 mcg to 145 mg daily. Side effect discussed. Adequate fiber and fluid intake. Instructed about warning signs. Follow-up in 4 weeks.

## 2018-07-27 NOTE — Progress Notes (Signed)
Chief Complaint  Patient presents with  . Blood pressure fluctuating    Ms. Jane [name] is a 73 y.o.female, who is here today concerned about BP readings. She was last seen in 04/2018 for her routine Medicare preventive visit and follow-up. 2 to 3 weeks of fatigue, intermittent dizziness, "feeling bad, no energy."  For the past 2 weeks she has had low BP's. BP readings 60 to 70s/30-40. After discontinuation of medications BP 108/80, 171/82, and 161/83. She states that she does not feel bad when BP is elevated.  She has not made changes in diet and she is not exercising regularly.  No changes in diuresis, urine x 3/day, small amount. She has not noted gross hematuria or dysuria. She fell in her kitchen about a week ago.  She was sitting, got up fast, felt dizzy, and fell.  She denies head trauma or LOC.   Currently on Coreg 25 mg twice daily. About 2 weeks ago she discontinued amlodipine and losartan. According to patient, nephrologist recommended taking Coreg as needed, if SBP > 140.   She has not noted unusual headache, visual changes, exertional chest pain, dyspnea,  focal weakness, or edema.  Lab Results  Component Value Date   CREATININE 6.44 (H) 08/04/2016   BUN 28 (H) 08/04/2016   NA 139 08/04/2016   K 4.9 08/04/2016   CL 104 08/04/2016   CO2 22 08/04/2016   She mention fatigue to nephrologist, so hemoglobin was checked. Hg was also low, around 9.0, she received iron IV 4 days ago and she felt better next day.  Atrial fibrillation: Currently she is on Coreg and Coumadin. She has not noted palpitations.  Hx of GERD, she is having nausea in the morning and cough. She thinks symptoms are related to GERD exacerbation. She has had symptoms intermittently for "a while." Currently she is not taking medications. Occasional heartburn. She has not identified exacerbating factors but nausea is alleviated by eating breakfast.   She is also complaining about  periumbilical abdominal cramp like pain, intermittent. No known exacerbating factors. Alleviated by defecation. History of constipation, she is on Linzess 72 mcg, 145 mcg caused diarrhea. Linzess 72 mcg is not longer helping. Last bowel movement was 2 to 3 days ago. She has not noted blood.  + Passing gas.   Review of Systems  Constitutional: Positive for activity change and fatigue. Negative for appetite change and fever.  HENT: Negative for mouth sores, nosebleeds and trouble swallowing.   Eyes: Negative for redness and visual disturbance.  Respiratory: Negative for cough, shortness of breath and wheezing.   Cardiovascular: Negative for chest pain, palpitations and leg swelling.  Gastrointestinal: Positive for abdominal pain, constipation and nausea. Negative for vomiting.       Negative for changes in bowel habits.  Endocrine: Negative for cold intolerance and heat intolerance.  Genitourinary: Negative for decreased urine volume, dysuria and hematuria.  Skin: Negative for pallor and rash.  Neurological: Positive for dizziness. Negative for syncope, weakness and headaches.  Psychiatric/Behavioral: Negative for confusion. The patient is nervous/anxious.      Current Outpatient Medications on File Prior to Visit  Medication Sig Dispense Refill  . albuterol (PROVENTIL HFA;VENTOLIN HFA) 108 (90 Base) MCG/ACT inhaler Inhale 1 puff into the lungs every 6 (six) hours as needed for wheezing or shortness of breath. 6.7 g 2  . Amino Acids-Protein Hydrolys (FEEDING SUPPLEMENT, PRO-STAT SUGAR FREE 64,) LIQD Take 30 mLs by mouth 2 (two) times daily. 900 mL 0  .  amiodarone (PACERONE) 200 MG tablet TAKE 1 TABLET BY MOUTH ONCE DAILY 90 tablet 1  . b complex vitamins capsule Take by mouth.    . calcium carbonate (TUMS - DOSED IN MG ELEMENTAL CALCIUM) 500 MG chewable tablet Chew 2 tablets by mouth 3 (three) times daily with meals.    . cinacalcet (SENSIPAR) 30 MG tablet Take 30 mg by mouth daily.     . diclofenac sodium (VOLTAREN) 1 % GEL Apply 4 g topically 4 (four) times daily. 4 Tube 3  . Eflornithine HCl 13.9 % cream Apply 1 application topically 2 (two) times daily. At least 8 hours apart. 45 g 1  . ethyl chloride spray USE 1 TO 2 SPRAYS TO DIALYSIS ACCESS EVERY TREATMENT  12  . lubiprostone (AMITIZA) 8 MCG capsule Take 1 capsule (8 mcg total) by mouth daily with breakfast. 30 capsule 1  . multivitamin (RENA-VIT) TABS tablet Take 1 tablet by mouth daily.    . sevelamer carbonate (RENVELA) 800 MG tablet Take 800-1,600 mg by mouth See admin instructions. 1,600 mg after meals TWO times a day and 800 mg after each snack    . warfarin (COUMADIN) 5 MG tablet TAKE AS DIRECTED 50 tablet 3   No current facility-administered medications on file prior to visit.      Past Medical History:  Diagnosis Date  . Acid reflux   . Atrial fibrillation ([place])   . Hypertension   . Renal disorder   . Sleep apnea     Allergies  Allergen Reactions  . Latex Rash  . Other Other (See Comments)    The plastic tape irritates her skin.  Silicone tapes cause redness  . Penicillins Rash  . Sulfa Antibiotics Rash  . Tape Other (See Comments)    PLASTIC TAPE TEARS OFF THE SKIN AND BRUISES IT TERRIBLY!! GETS ITCHY AND RASH FROM PAPER AND PLASTIC TAPES PLASTIC TAPE TEARS OFF THE SKIN AND BRUISES IT TERRIBLY!! PLASTIC TAPE TEARS OFF THE SKIN AND BRUISES IT TERRIBLY!!    Social History   Socioeconomic History  . Marital status: Single    Spouse name: Not on file  . Number of children: Not on file  . Years of education: Not on file  . Highest education level: Not on file  Occupational History  . Not on file  Social Needs  . Financial resource strain: Not on file  . Food insecurity:    Worry: Not on file    Inability: Not on file  . Transportation needs:    Medical: Not on file    Non-medical: Not on file  Tobacco Use  . Smoking status: Never Smoker  . Smokeless tobacco: Never Used  Substance  and Sexual Activity  . Alcohol use: No  . Drug use: No  . Sexual activity: Never  Lifestyle  . Physical activity:    Days per week: Not on file    Minutes per session: Not on file  . Stress: Not on file  Relationships  . Social connections:    Talks on phone: Not on file    Gets together: Not on file    Attends religious service: Not on file    Active member of club or organization: Not on file    Attends meetings of clubs or organizations: Not on file    Relationship status: Not on file  Other Topics Concern  . Not on file  Social History Narrative  . Not on file    Vitals:   07/27/18  0701  BP: 140/70  Pulse: 72  Resp: 12  Temp: 98.1 F (36.7 C)  SpO2: 97%   Body mass index is 40.46 kg/m.  Physical Exam  Nursing note and vitals reviewed. Constitutional: She is oriented to person, place, and time. She appears well-developed. No distress.  HENT:  Head: Normocephalic and atraumatic.  Mouth/Throat: Oropharynx is clear and moist and mucous membranes are normal.  Eyes: Pupils are equal, round, and reactive to light. Conjunctivae are normal.  Cardiovascular: Normal rate and regular rhythm.  Murmur (SEM I-II LUSB and RUSB.) heard. Pulses:      Dorsalis pedis pulses are 2+ on the right side and 2+ on the left side.  Respiratory: Effort normal and breath sounds normal. No respiratory distress.  GI: Soft. She exhibits no mass. There is no hepatomegaly. There is no abdominal tenderness.  Musculoskeletal:        General: No edema.  Lymphadenopathy:    She has no cervical adenopathy.  Neurological: She is alert and oriented to person, place, and time. She has normal strength. No cranial nerve deficit. Gait normal.  Skin: Skin is warm. No rash noted. No erythema.  Psychiatric: She has a normal mood and affect.  Well groomed, good eye contact.    ASSESSMENT AND PLAN:   Jane [name] was seen today for blood pressure fluctuating.  Diagnoses and all orders for this  visit:  Hypertension with renal disease Amlodipine and losartan always discontinued, will not resume. Coreg decreased from 25 mg twice daily to 12.5 mg twice daily. Continue monitoring BP. Adequate hydration. Recommend obtaining her BP monitor next visit. Follow-up in 4 weeks, before if needed.  Paroxysmal atrial fibrillation (HCC) Rate and rhythm control. Coreg was decreased from 25 mg twice daily to 12.5 mg twice daily. Continue Coumadin. Follow-up in 4 weeks.  Constipation Problem is not well controlled with current dose of Linzess, so increased from 72 mcg to 145 mg daily. Side effect discussed. Adequate fiber and fluid intake. Instructed about warning signs. Follow-up in 4 weeks.  Anemia in other chronic diseases classified elsewhere Anemia of chronic disease and iron deficiency. She has not noted GI bleed. Colonoscopy last done in 2017. She is having labs done every other week before dialysis.   GERD (gastroesophageal reflux disease) Omeprazole 20 mg daily before breakfast. GERD precautions also recommended. Follow-up in 4 weeks.  Abdominal pain, diffuse It seems to be related to constipation. No pain elicited during examination. I do not think imaging or further work-up are needed today. Instructed about warning signs.  Fall precautions also discussed.  Return in about 4 weeks (around 08/24/2018) for BP.    Cylas Falzone G. [name], MD  [facility]. [place] office.

## 2018-07-27 NOTE — Patient Instructions (Signed)
A few things to remember from today's visit:   Hypertension with renal disease - Plan: carvedilol (COREG) 25 MG tablet  Constipation, unspecified constipation type - Plan: linaclotide (LINZESS) 145 MCG CAPS capsule  Abdominal pain, diffuse  Anemia in other chronic diseases classified elsewhere  Coreg decreased to 12.5 mg bid,skip dialysis days. Linzess increased to 145 mcg. Benafiber 2 tps daily.  Please be sure medication list is accurate. If a new problem present, please set up appointment sooner than planned today.

## 2018-07-27 NOTE — Assessment & Plan Note (Signed)
Rate and rhythm control. Coreg was decreased from 25 mg twice daily to 12.5 mg twice daily. Continue Coumadin. Follow-up in 4 weeks.

## 2018-07-27 NOTE — Assessment & Plan Note (Addendum)
Anemia of chronic disease and iron deficiency. She has not noted GI bleed. Colonoscopy last done in 2017. She is having labs done every other week before dialysis.

## 2018-07-27 NOTE — Assessment & Plan Note (Signed)
Amlodipine and losartan always discontinued, will not resume. Coreg decreased from 25 mg twice daily to 12.5 mg twice daily. Continue monitoring BP. Adequate hydration. Recommend obtaining her BP monitor next visit. Follow-up in 4 weeks, before if needed.

## 2018-07-28 DIAGNOSIS — D631 Anemia in chronic kidney disease: Secondary | ICD-10-CM | POA: Diagnosis not present

## 2018-07-28 DIAGNOSIS — N2581 Secondary hyperparathyroidism of renal origin: Secondary | ICD-10-CM | POA: Diagnosis not present

## 2018-07-28 DIAGNOSIS — N186 End stage renal disease: Secondary | ICD-10-CM | POA: Diagnosis not present

## 2018-07-30 DIAGNOSIS — D631 Anemia in chronic kidney disease: Secondary | ICD-10-CM | POA: Diagnosis not present

## 2018-07-30 DIAGNOSIS — N2581 Secondary hyperparathyroidism of renal origin: Secondary | ICD-10-CM | POA: Diagnosis not present

## 2018-07-30 DIAGNOSIS — N186 End stage renal disease: Secondary | ICD-10-CM | POA: Diagnosis not present

## 2018-08-02 DIAGNOSIS — N2581 Secondary hyperparathyroidism of renal origin: Secondary | ICD-10-CM | POA: Diagnosis not present

## 2018-08-02 DIAGNOSIS — N186 End stage renal disease: Secondary | ICD-10-CM | POA: Diagnosis not present

## 2018-08-02 DIAGNOSIS — D631 Anemia in chronic kidney disease: Secondary | ICD-10-CM | POA: Diagnosis not present

## 2018-08-03 DIAGNOSIS — Z7901 Long term (current) use of anticoagulants: Secondary | ICD-10-CM | POA: Diagnosis not present

## 2018-08-03 DIAGNOSIS — I951 Orthostatic hypotension: Secondary | ICD-10-CM | POA: Diagnosis not present

## 2018-08-03 DIAGNOSIS — I480 Paroxysmal atrial fibrillation: Secondary | ICD-10-CM | POA: Diagnosis not present

## 2018-08-04 DIAGNOSIS — N186 End stage renal disease: Secondary | ICD-10-CM | POA: Diagnosis not present

## 2018-08-04 DIAGNOSIS — D631 Anemia in chronic kidney disease: Secondary | ICD-10-CM | POA: Diagnosis not present

## 2018-08-04 DIAGNOSIS — N2581 Secondary hyperparathyroidism of renal origin: Secondary | ICD-10-CM | POA: Diagnosis not present

## 2018-08-06 DIAGNOSIS — N2581 Secondary hyperparathyroidism of renal origin: Secondary | ICD-10-CM | POA: Diagnosis not present

## 2018-08-06 DIAGNOSIS — D631 Anemia in chronic kidney disease: Secondary | ICD-10-CM | POA: Diagnosis not present

## 2018-08-06 DIAGNOSIS — N186 End stage renal disease: Secondary | ICD-10-CM | POA: Diagnosis not present

## 2018-08-09 DIAGNOSIS — N186 End stage renal disease: Secondary | ICD-10-CM | POA: Diagnosis not present

## 2018-08-09 DIAGNOSIS — D631 Anemia in chronic kidney disease: Secondary | ICD-10-CM | POA: Diagnosis not present

## 2018-08-09 DIAGNOSIS — N2581 Secondary hyperparathyroidism of renal origin: Secondary | ICD-10-CM | POA: Diagnosis not present

## 2018-08-10 ENCOUNTER — Other Ambulatory Visit: Payer: Self-pay | Admitting: Cardiology

## 2018-08-10 DIAGNOSIS — I951 Orthostatic hypotension: Secondary | ICD-10-CM

## 2018-08-11 DIAGNOSIS — N186 End stage renal disease: Secondary | ICD-10-CM | POA: Diagnosis not present

## 2018-08-11 DIAGNOSIS — N2581 Secondary hyperparathyroidism of renal origin: Secondary | ICD-10-CM | POA: Diagnosis not present

## 2018-08-11 DIAGNOSIS — D631 Anemia in chronic kidney disease: Secondary | ICD-10-CM | POA: Diagnosis not present

## 2018-08-11 DIAGNOSIS — E1129 Type 2 diabetes mellitus with other diabetic kidney complication: Secondary | ICD-10-CM | POA: Diagnosis not present

## 2018-08-13 DIAGNOSIS — D631 Anemia in chronic kidney disease: Secondary | ICD-10-CM | POA: Diagnosis not present

## 2018-08-13 DIAGNOSIS — N186 End stage renal disease: Secondary | ICD-10-CM | POA: Diagnosis not present

## 2018-08-13 DIAGNOSIS — N2581 Secondary hyperparathyroidism of renal origin: Secondary | ICD-10-CM | POA: Diagnosis not present

## 2018-08-16 DIAGNOSIS — D631 Anemia in chronic kidney disease: Secondary | ICD-10-CM | POA: Diagnosis not present

## 2018-08-16 DIAGNOSIS — N2581 Secondary hyperparathyroidism of renal origin: Secondary | ICD-10-CM | POA: Diagnosis not present

## 2018-08-16 DIAGNOSIS — N186 End stage renal disease: Secondary | ICD-10-CM | POA: Diagnosis not present

## 2018-08-18 DIAGNOSIS — D631 Anemia in chronic kidney disease: Secondary | ICD-10-CM | POA: Diagnosis not present

## 2018-08-18 DIAGNOSIS — N186 End stage renal disease: Secondary | ICD-10-CM | POA: Diagnosis not present

## 2018-08-18 DIAGNOSIS — N2581 Secondary hyperparathyroidism of renal origin: Secondary | ICD-10-CM | POA: Diagnosis not present

## 2018-08-20 DIAGNOSIS — N186 End stage renal disease: Secondary | ICD-10-CM | POA: Diagnosis not present

## 2018-08-20 DIAGNOSIS — D631 Anemia in chronic kidney disease: Secondary | ICD-10-CM | POA: Diagnosis not present

## 2018-08-20 DIAGNOSIS — N2581 Secondary hyperparathyroidism of renal origin: Secondary | ICD-10-CM | POA: Diagnosis not present

## 2018-08-21 DIAGNOSIS — I158 Other secondary hypertension: Secondary | ICD-10-CM | POA: Diagnosis not present

## 2018-08-21 DIAGNOSIS — Z992 Dependence on renal dialysis: Secondary | ICD-10-CM | POA: Diagnosis not present

## 2018-08-21 DIAGNOSIS — N186 End stage renal disease: Secondary | ICD-10-CM | POA: Diagnosis not present

## 2018-08-23 DIAGNOSIS — N186 End stage renal disease: Secondary | ICD-10-CM | POA: Diagnosis not present

## 2018-08-23 DIAGNOSIS — D631 Anemia in chronic kidney disease: Secondary | ICD-10-CM | POA: Diagnosis not present

## 2018-08-23 DIAGNOSIS — N2581 Secondary hyperparathyroidism of renal origin: Secondary | ICD-10-CM | POA: Diagnosis not present

## 2018-08-23 DIAGNOSIS — D509 Iron deficiency anemia, unspecified: Secondary | ICD-10-CM | POA: Diagnosis not present

## 2018-08-24 ENCOUNTER — Ambulatory Visit (INDEPENDENT_AMBULATORY_CARE_PROVIDER_SITE_OTHER): Payer: Medicare Other | Admitting: Family Medicine

## 2018-08-24 ENCOUNTER — Encounter: Payer: Self-pay | Admitting: Family Medicine

## 2018-08-24 VITALS — BP 110/60 | HR 83 | Temp 98.9°F | Resp 16 | Ht 65.0 in | Wt 241.5 lb

## 2018-08-24 DIAGNOSIS — N898 Other specified noninflammatory disorders of vagina: Secondary | ICD-10-CM | POA: Diagnosis not present

## 2018-08-24 DIAGNOSIS — R5383 Other fatigue: Secondary | ICD-10-CM | POA: Diagnosis not present

## 2018-08-24 DIAGNOSIS — K5900 Constipation, unspecified: Secondary | ICD-10-CM

## 2018-08-24 DIAGNOSIS — R600 Localized edema: Secondary | ICD-10-CM

## 2018-08-24 DIAGNOSIS — K219 Gastro-esophageal reflux disease without esophagitis: Secondary | ICD-10-CM | POA: Diagnosis not present

## 2018-08-24 DIAGNOSIS — I129 Hypertensive chronic kidney disease with stage 1 through stage 4 chronic kidney disease, or unspecified chronic kidney disease: Secondary | ICD-10-CM | POA: Diagnosis not present

## 2018-08-24 MED ORDER — NYSTATIN-TRIAMCINOLONE 100000-0.1 UNIT/GM-% EX CREA: 1.0000 "application " | TOPICAL_CREAM | Freq: Two times a day (BID) | CUTANEOUS | 0 refills | Status: DC | PRN

## 2018-08-24 MED ORDER — FLUCONAZOLE 150 MG PO TABS: 150.0000 mg | ORAL_TABLET | Freq: Once | ORAL | 0 refills | Status: AC

## 2018-08-24 NOTE — Progress Notes (Signed)
HPI:   Jane [name] is a 73 y.o. female, who is here today to follow on recent OV.  She was last seen on 07/27/18,when she was concerned about low BP's and lightheadedness. Coreg was decreased from 25 mg bid to 12.5 mg bid. She has not noted changes in HR or palpitations.  BP has improved,having some elevated SBP's at home. 155/71,175/80,154/73,and 138/57. Before dialysis 180's/70's and after 130/60. Lightheadedness has resolved.  She denies headache,chest pain, gross hematuria, or changes in urine output.   Constipation: Last visit I recommend increasing fiber intake.  States that she cannot take fiber because she is on dialysis. Last visit Linzess was increased from 72 mcg to 145 mcg. She is taking medication daily as needed and helping with constipation. Negative for blood in stool or melena. Abdominal pain resolved.  Last OV she was c/o nausea and non productive ,worse in the morning. Omeprazole 20 mg recommended,problem has resolved.  C/O SOB, feeling "tired all the time",which has been going on for awhile. Negative for associated chest pain or diaphoresis. Since her last OV she has seen cardiologist,planning on stress test next week. LE edema,chronic. Exacerbated by prolonged standing/walking,worse at the end of the day and better in the morning. Compression stocking recommended by cardiologist but expensive.  Denies PND or orthopnea.  Today she is c/o whitish vaginal discharge and skin pruritus "for a while." She has had similar symptoms in the past and Dx with yeast infection. She has not noted vaginal bleeding or dysuria. She has not tried OTC treatments.  She is not sexually active.   Review of Systems  Constitutional: Positive for fatigue. Negative for activity change, appetite change and fever.  HENT: Negative for mouth sores and nosebleeds.   Eyes: Negative for redness and visual disturbance.  Respiratory: Positive for shortness of breath. Negative  for cough and wheezing.   Cardiovascular: Positive for leg swelling. Negative for chest pain and palpitations.  Gastrointestinal: Negative for abdominal pain, nausea and vomiting.  Genitourinary: Negative for decreased urine volume and hematuria.  Neurological: Negative for syncope, weakness and headaches.     Current Outpatient Medications on File Prior to Visit  Medication Sig Dispense Refill  . albuterol (PROVENTIL HFA;VENTOLIN HFA) 108 (90 Base) MCG/ACT inhaler Inhale 1 puff into the lungs every 6 (six) hours as needed for wheezing or shortness of breath. 6.7 g 2  . Amino Acids-Protein Hydrolys (FEEDING SUPPLEMENT, PRO-STAT SUGAR FREE 64,) LIQD Take 30 mLs by mouth 2 (two) times daily. 900 mL 0  . amiodarone (PACERONE) 200 MG tablet TAKE 1 TABLET BY MOUTH ONCE DAILY 90 tablet 1  . b complex vitamins capsule Take by mouth.    . B Complex-C-Zn-Folic Acid (DIALYVITE 485-IOEV 15) 0.8 MG TABS TAKE 1 TABLET BY MOUTH ONCE A DAY WITH DINNER (FIRST MEAL AFTER DIALYSIS)    . carvedilol (COREG) 25 MG tablet Take 0.5 tablets (12.5 mg total) by mouth 2 (two) times daily with a meal. 360 tablet 0  . cinacalcet (SENSIPAR) 60 MG tablet Take 60 mg by mouth every other day.    . ethyl chloride spray USE 1 TO 2 SPRAYS TO DIALYSIS ACCESS EVERY TREATMENT  12  . linaclotide (LINZESS) 145 MCG CAPS capsule Take 1 capsule (145 mcg total) by mouth daily before breakfast. 90 capsule 2  . lubiprostone (AMITIZA) 8 MCG capsule Take 1 capsule (8 mcg total) by mouth daily with breakfast. 30 capsule 1  . multivitamin (RENA-VIT) TABS tablet Take 1 tablet  by mouth daily.    Marland Kitchen omeprazole (PRILOSEC) 20 MG capsule Take 1 capsule (20 mg total) by mouth daily before breakfast. 30 capsule 1  . sevelamer carbonate (RENVELA) 800 MG tablet Take 800-1,600 mg by mouth See admin instructions. 1,600 mg after meals TWO times a day and 800 mg after each snack    . warfarin (COUMADIN) 5 MG tablet TAKE AS DIRECTED 50 tablet 3   No current  facility-administered medications on file prior to visit.      Past Medical History:  Diagnosis Date  . Acid reflux   . Atrial fibrillation ([place])   . Hypertension   . Renal disorder   . Sleep apnea    Allergies  Allergen Reactions  . Latex Rash  . Other Other (See Comments)    The plastic tape irritates her skin.  Silicone tapes cause redness  . Penicillins Rash  . Sulfa Antibiotics Rash  . Tape Other (See Comments)    PLASTIC TAPE TEARS OFF THE SKIN AND BRUISES IT TERRIBLY!! GETS ITCHY AND RASH FROM PAPER AND PLASTIC TAPES PLASTIC TAPE TEARS OFF THE SKIN AND BRUISES IT TERRIBLY!! PLASTIC TAPE TEARS OFF THE SKIN AND BRUISES IT TERRIBLY!!    Social History   Socioeconomic History  . Marital status: Single    Spouse name: Not on file  . Number of children: Not on file  . Years of education: Not on file  . Highest education level: Not on file  Occupational History  . Not on file  Social Needs  . Financial resource strain: Not on file  . Food insecurity:    Worry: Not on file    Inability: Not on file  . Transportation needs:    Medical: Not on file    Non-medical: Not on file  Tobacco Use  . Smoking status: Never Smoker  . Smokeless tobacco: Never Used  Substance and Sexual Activity  . Alcohol use: No  . Drug use: No  . Sexual activity: Never  Lifestyle  . Physical activity:    Days per week: Not on file    Minutes per session: Not on file  . Stress: Not on file  Relationships  . Social connections:    Talks on phone: Not on file    Gets together: Not on file    Attends religious service: Not on file    Active member of club or organization: Not on file    Attends meetings of clubs or organizations: Not on file    Relationship status: Not on file  Other Topics Concern  . Not on file  Social History Narrative  . Not on file    Vitals:   08/24/18 0947  BP: 110/60  Pulse: 83  Resp: 16  Temp: 98.9 F (37.2 C)  SpO2: 97%   Body mass index is  40.19 kg/m.   Physical Exam  Nursing note and vitals reviewed. Constitutional: She is oriented to person, place, and time. She appears well-developed. No distress.  HENT:  Head: Normocephalic and atraumatic.  Mouth/Throat: Oropharynx is clear and moist and mucous membranes are normal.  Eyes: Pupils are equal, round, and reactive to light. Conjunctivae are normal.  Cardiovascular: Normal rate and regular rhythm.  Murmur (SEM I/VI LUSB and RUSB) heard. Pulses:      Dorsalis pedis pulses are 2+ on the right side and 2+ on the left side.  Respiratory: Effort normal and breath sounds normal. No respiratory distress.  GI: Soft. She exhibits no mass. There  is no hepatomegaly. There is no abdominal tenderness.  Genitourinary:    Genitourinary Comments: Deferred to gyn if not better.   Musculoskeletal:        General: Edema (Trace pitting LE edema,bilateral) present.  Neurological: She is alert and oriented to person, place, and time. She has normal strength. No cranial nerve deficit.  Skin: Skin is warm. No rash noted. No erythema.  Psychiatric: She has a normal mood and affect.  Well groomed, good eye contact.    ASSESSMENT AND PLAN:  Ms. Eudora was seen today for follow-up.  Diagnoses and all orders for this visit:  Vaginal discharge We discussed possible causes. Empiric treatment for fungal etiology. Recommend topical treatment for skin of external genitals. She has taken Diflucan before with no problem,side effects discussed. INR to be checked in 3 days. If problem is persistent will consider gyn evaluation.  -     fluconazole (DIFLUCAN) 150 MG tablet; Take 1 tablet (150 mg total) by mouth once for 1 dose. -     nystatin-triamcinolone (MYCOLOG II) cream; Apply 1 application topically 2 (two) times daily as needed.  Hypertension with renal disease BP re-checked 125/50 LUE , her BP monitor 144/77. Today BP is adequate. No changes in current management.  Constipation,  unspecified constipation type Improved. Continue Linzess 145 mcg daily as needed.  Gastroesophageal reflux disease, esophagitis presence not specified Symptoms improved. No changes in Omeprazole. GERD precautions to continue.  Bilateral lower extremity edema Compression stocking and/or LE elevation a few times during the day. Planning on checking stocking at [facility],.   Other fatigue Chronic. Some of her chronic medical problems ca be contributing to the problem.    Return in about 5 months (around 01/22/2019) for f/u.     Stephine Langbehn G. [name], MD  [facility] [place] office.

## 2018-08-24 NOTE — Patient Instructions (Addendum)
A few things to remember from today's visit:   Vaginal discharge - Plan: fluconazole (DIFLUCAN) 150 MG tablet, nystatin-triamcinolone (MYCOLOG II) cream  Hypertension with renal disease  Constipation, unspecified constipation type  Coumadin check in 3 to 4 days. Please be sure medication list is accurate. If a new problem present, please set up appointment sooner than planned today.

## 2018-08-25 DIAGNOSIS — N2581 Secondary hyperparathyroidism of renal origin: Secondary | ICD-10-CM | POA: Diagnosis not present

## 2018-08-25 DIAGNOSIS — D631 Anemia in chronic kidney disease: Secondary | ICD-10-CM | POA: Diagnosis not present

## 2018-08-25 DIAGNOSIS — N186 End stage renal disease: Secondary | ICD-10-CM | POA: Diagnosis not present

## 2018-08-25 DIAGNOSIS — D509 Iron deficiency anemia, unspecified: Secondary | ICD-10-CM | POA: Diagnosis not present

## 2018-08-27 DIAGNOSIS — N186 End stage renal disease: Secondary | ICD-10-CM | POA: Diagnosis not present

## 2018-08-27 DIAGNOSIS — N2581 Secondary hyperparathyroidism of renal origin: Secondary | ICD-10-CM | POA: Diagnosis not present

## 2018-08-27 DIAGNOSIS — D631 Anemia in chronic kidney disease: Secondary | ICD-10-CM | POA: Diagnosis not present

## 2018-08-27 DIAGNOSIS — D509 Iron deficiency anemia, unspecified: Secondary | ICD-10-CM | POA: Diagnosis not present

## 2018-08-28 ENCOUNTER — Encounter: Payer: Self-pay | Admitting: Family Medicine

## 2018-08-30 DIAGNOSIS — D509 Iron deficiency anemia, unspecified: Secondary | ICD-10-CM | POA: Diagnosis not present

## 2018-08-30 DIAGNOSIS — D631 Anemia in chronic kidney disease: Secondary | ICD-10-CM | POA: Diagnosis not present

## 2018-08-30 DIAGNOSIS — N186 End stage renal disease: Secondary | ICD-10-CM | POA: Diagnosis not present

## 2018-08-30 DIAGNOSIS — N2581 Secondary hyperparathyroidism of renal origin: Secondary | ICD-10-CM | POA: Diagnosis not present

## 2018-08-31 ENCOUNTER — Telehealth: Payer: Self-pay

## 2018-09-01 DIAGNOSIS — D509 Iron deficiency anemia, unspecified: Secondary | ICD-10-CM | POA: Diagnosis not present

## 2018-09-01 DIAGNOSIS — N2581 Secondary hyperparathyroidism of renal origin: Secondary | ICD-10-CM | POA: Diagnosis not present

## 2018-09-01 DIAGNOSIS — N186 End stage renal disease: Secondary | ICD-10-CM | POA: Diagnosis not present

## 2018-09-01 DIAGNOSIS — D631 Anemia in chronic kidney disease: Secondary | ICD-10-CM | POA: Diagnosis not present

## 2018-09-03 DIAGNOSIS — N186 End stage renal disease: Secondary | ICD-10-CM | POA: Diagnosis not present

## 2018-09-03 DIAGNOSIS — N2581 Secondary hyperparathyroidism of renal origin: Secondary | ICD-10-CM | POA: Diagnosis not present

## 2018-09-03 DIAGNOSIS — D509 Iron deficiency anemia, unspecified: Secondary | ICD-10-CM | POA: Diagnosis not present

## 2018-09-03 DIAGNOSIS — D631 Anemia in chronic kidney disease: Secondary | ICD-10-CM | POA: Diagnosis not present

## 2018-09-06 ENCOUNTER — Ambulatory Visit (INDEPENDENT_AMBULATORY_CARE_PROVIDER_SITE_OTHER): Payer: Medicare Other | Admitting: Cardiology

## 2018-09-06 ENCOUNTER — Ambulatory Visit: Payer: Medicare Other

## 2018-09-06 DIAGNOSIS — Z7901 Long term (current) use of anticoagulants: Secondary | ICD-10-CM

## 2018-09-06 DIAGNOSIS — I951 Orthostatic hypotension: Secondary | ICD-10-CM

## 2018-09-06 DIAGNOSIS — I480 Paroxysmal atrial fibrillation: Secondary | ICD-10-CM | POA: Diagnosis not present

## 2018-09-06 LAB — PROTIME-INR: INR: 3 — AB (ref 0.9–1.1)

## 2018-09-06 LAB — POCT INR: INR: 6.3 — AB (ref 2.0–3.0)

## 2018-09-07 DIAGNOSIS — N186 End stage renal disease: Secondary | ICD-10-CM | POA: Diagnosis not present

## 2018-09-07 DIAGNOSIS — D509 Iron deficiency anemia, unspecified: Secondary | ICD-10-CM | POA: Diagnosis not present

## 2018-09-07 DIAGNOSIS — D631 Anemia in chronic kidney disease: Secondary | ICD-10-CM | POA: Diagnosis not present

## 2018-09-07 DIAGNOSIS — N2581 Secondary hyperparathyroidism of renal origin: Secondary | ICD-10-CM | POA: Diagnosis not present

## 2018-09-08 DIAGNOSIS — D631 Anemia in chronic kidney disease: Secondary | ICD-10-CM | POA: Diagnosis not present

## 2018-09-08 DIAGNOSIS — N2581 Secondary hyperparathyroidism of renal origin: Secondary | ICD-10-CM | POA: Diagnosis not present

## 2018-09-08 DIAGNOSIS — D509 Iron deficiency anemia, unspecified: Secondary | ICD-10-CM | POA: Diagnosis not present

## 2018-09-08 DIAGNOSIS — N186 End stage renal disease: Secondary | ICD-10-CM | POA: Diagnosis not present

## 2018-09-10 ENCOUNTER — Ambulatory Visit: Payer: Self-pay

## 2018-09-10 ENCOUNTER — Other Ambulatory Visit: Payer: Self-pay

## 2018-09-10 DIAGNOSIS — D509 Iron deficiency anemia, unspecified: Secondary | ICD-10-CM | POA: Diagnosis not present

## 2018-09-10 DIAGNOSIS — N2581 Secondary hyperparathyroidism of renal origin: Secondary | ICD-10-CM | POA: Diagnosis not present

## 2018-09-10 DIAGNOSIS — N186 End stage renal disease: Secondary | ICD-10-CM | POA: Diagnosis not present

## 2018-09-10 DIAGNOSIS — D631 Anemia in chronic kidney disease: Secondary | ICD-10-CM | POA: Diagnosis not present

## 2018-09-13 ENCOUNTER — Telehealth: Payer: Self-pay | Admitting: *Deleted

## 2018-09-13 DIAGNOSIS — N186 End stage renal disease: Secondary | ICD-10-CM | POA: Diagnosis not present

## 2018-09-13 DIAGNOSIS — D631 Anemia in chronic kidney disease: Secondary | ICD-10-CM | POA: Diagnosis not present

## 2018-09-13 DIAGNOSIS — D509 Iron deficiency anemia, unspecified: Secondary | ICD-10-CM | POA: Diagnosis not present

## 2018-09-13 DIAGNOSIS — N2581 Secondary hyperparathyroidism of renal origin: Secondary | ICD-10-CM | POA: Diagnosis not present

## 2018-09-13 NOTE — Telephone Encounter (Signed)
Prior auth for Nystatin-triamcinolone cream sent to [email or web address] [id number].

## 2018-09-13 NOTE — Telephone Encounter (Signed)
Nystat/Triam Cream, Combination not covered, may be split into 2 scripts.

## 2018-09-15 ENCOUNTER — Other Ambulatory Visit: Payer: Self-pay | Admitting: Family Medicine

## 2018-09-15 DIAGNOSIS — N2581 Secondary hyperparathyroidism of renal origin: Secondary | ICD-10-CM | POA: Diagnosis not present

## 2018-09-15 DIAGNOSIS — D631 Anemia in chronic kidney disease: Secondary | ICD-10-CM | POA: Diagnosis not present

## 2018-09-15 DIAGNOSIS — D509 Iron deficiency anemia, unspecified: Secondary | ICD-10-CM | POA: Diagnosis not present

## 2018-09-15 DIAGNOSIS — N186 End stage renal disease: Secondary | ICD-10-CM | POA: Diagnosis not present

## 2018-09-15 MED ORDER — TRIAMCINOLONE ACETONIDE 0.1 % EX CREA: 1.0000 "application " | TOPICAL_CREAM | Freq: Every day | CUTANEOUS | 0 refills | Status: DC | PRN

## 2018-09-15 MED ORDER — NYSTATIN 100000 UNIT/GM EX CREA: 1.0000 "application " | TOPICAL_CREAM | Freq: Two times a day (BID) | CUTANEOUS | 2 refills | Status: DC

## 2018-09-15 NOTE — Telephone Encounter (Signed)
Fax received from [facility] stating the request was denied and this was given to Dr Doug Sou asst.

## 2018-09-15 NOTE — Telephone Encounter (Signed)
Denial given to doctor for review.

## 2018-09-15 NOTE — Telephone Encounter (Signed)
Prescription for triamcinolone and nystatin cream were sent to her pharmacy. Thanks, BJ

## 2018-09-16 ENCOUNTER — Encounter: Payer: Self-pay | Admitting: Cardiology

## 2018-09-16 ENCOUNTER — Ambulatory Visit (INDEPENDENT_AMBULATORY_CARE_PROVIDER_SITE_OTHER): Payer: Medicare Other | Admitting: Cardiology

## 2018-09-16 VITALS — BP 113/51 | HR 58 | Ht 65.0 in | Wt 235.8 lb

## 2018-09-16 DIAGNOSIS — I951 Orthostatic hypotension: Secondary | ICD-10-CM | POA: Insufficient documentation

## 2018-09-16 DIAGNOSIS — R5383 Other fatigue: Secondary | ICD-10-CM

## 2018-09-16 DIAGNOSIS — I480 Paroxysmal atrial fibrillation: Secondary | ICD-10-CM

## 2018-09-16 DIAGNOSIS — N186 End stage renal disease: Secondary | ICD-10-CM | POA: Diagnosis not present

## 2018-09-16 DIAGNOSIS — I129 Hypertensive chronic kidney disease with stage 1 through stage 4 chronic kidney disease, or unspecified chronic kidney disease: Secondary | ICD-10-CM | POA: Diagnosis not present

## 2018-09-16 DIAGNOSIS — Z992 Dependence on renal dialysis: Secondary | ICD-10-CM

## 2018-09-16 DIAGNOSIS — Z7901 Long term (current) use of anticoagulants: Secondary | ICD-10-CM | POA: Diagnosis not present

## 2018-09-16 DIAGNOSIS — D509 Iron deficiency anemia, unspecified: Secondary | ICD-10-CM

## 2018-09-16 LAB — POCT INR: INR: 2.2 (ref 2.0–3.0)

## 2018-09-16 NOTE — Progress Notes (Signed)
Subjective:   Jane [name], female    DOB: October 05, 1945, 73 y.o.   MRN: [id number]  [name], [name], MD:  Chief Complaint  Patient presents with  . Hypertension    1 month F/U with INR check and orthostatic BP, stress test results    HPI: Jane [name]  is a 73 y.o. female  with paroxysmal atrial fibrillation, diagnosed sometime in 1999, has had cardioversion in 2010 while she was in [place] and has been on amiodarone since then. Amiodarone recently decreased due to QT prolongation. She has end-stage renal disease and is presently on hemodialysis on M-W-F. Echocardiogram in April 2019 revealed grade 2 diastolic dysfucntion, trace aortic stenosis, and elevated pulmonary pressures.  Patient was seen in January for acute visit for dizziness found to be related to orthostatic hypotension. She also underwent lexiscan nuclear stress test and now presents to discuss results.    Since wearing support stockings, near syncope has resolved. She has not had any syncope or palpitations. No leg edema. Continues to have fatigue and occasional chest pain.  She continues to be on dialysis 3 days a week and blood pressure has essentially been stable.   Has history of anemia and is scheduled for iron transfusion later this week. Also has hemorrhoids that occasionally bleed and is on linzess for management.  She is also here for INR check today.     Past Medical History:  Diagnosis Date  . Acid reflux   . Atrial fibrillation ([place])   . Bilateral carotid bruits   . Hypertension   . Malaise and fatigue   . Orthostatic hypotension   . Renal disorder   . Shortness of breath   . Sleep apnea   . Syncope     Past Surgical History:  Procedure Laterality Date  . BACK SURGERY    . colon removal  06/2016  . IR GENERIC HISTORICAL  07/09/2016   IR US GUIDE VASC ACCESS RIGHT 07/09/2016 Arne Cleveland, MD [facility]  . IR GENERIC HISTORICAL  07/09/2016   IR FLUORO GUIDE CV LINE RIGHT 07/09/2016 Arne Cleveland, MD [facility]  . KNEE ARTHROPLASTY    . LAPAROSCOPIC SIGMOID COLECTOMY N/A 07/11/2016   Procedure: LAPAROSCOPIC SIGMOID COLECTOMY;  Surgeon: Clovis Riley, MD;  Location: [facility];  Service: General;  Laterality: N/A;  . TUBAL LIGATION      Family History  Problem Relation Age of Onset  . Heart failure Mother   . Stroke Mother   . Other Father     Social History   Socioeconomic History  . Marital status: Divorced    Spouse name: Not on file  . Number of children: 4  . Years of education: Not on file  . Highest education level: Not on file  Occupational History  . Not on file  Social Needs  . Financial resource strain: Not on file  . Food insecurity:    Worry: Not on file    Inability: Not on file  . Transportation needs:    Medical: Not on file    Non-medical: Not on file  Tobacco Use  . Smoking status: Never Smoker  . Smokeless tobacco: Never Used  Substance and Sexual Activity  . Alcohol use: No  . Drug use: No  . Sexual activity: Never  Lifestyle  . Physical activity:    Days per week: Not on file    Minutes per session: Not on file  . Stress: Not on file  Relationships  .  Social connections:    Talks on phone: Not on file    Gets together: Not on file    Attends religious service: Not on file    Active member of club or organization: Not on file    Attends meetings of clubs or organizations: Not on file    Relationship status: Not on file  . Intimate partner violence:    Fear of current or ex partner: Not on file    Emotionally abused: Not on file    Physically abused: Not on file    Forced sexual activity: Not on file  Other Topics Concern  . Not on file  Social History Narrative  . Not on file    Current Meds  Medication Sig  . albuterol (PROVENTIL HFA;VENTOLIN HFA) 108 (90 Base) MCG/ACT inhaler Inhale 1 puff into the lungs every 6 (six) hours as needed for wheezing or shortness of breath.  Marland Kitchen amiodarone (PACERONE) 200 MG tablet TAKE  1 TABLET BY MOUTH ONCE DAILY  . carvedilol (COREG) 25 MG tablet Take 0.5 tablets (12.5 mg total) by mouth 2 (two) times daily with a meal.  . cinacalcet (SENSIPAR) 60 MG tablet Take 60 mg by mouth every other day.  . ethyl chloride spray USE 1 TO 2 SPRAYS TO DIALYSIS ACCESS EVERY TREATMENT  . linaclotide (LINZESS) 145 MCG CAPS capsule Take 1 capsule (145 mcg total) by mouth daily before breakfast.  . omeprazole (PRILOSEC) 20 MG capsule Take 1 capsule (20 mg total) by mouth daily before breakfast.  . sevelamer carbonate (RENVELA) 800 MG tablet Take 800-1,600 mg by mouth See admin instructions. 1,600 mg after meals TWO times a day and 800 mg after each snack  . warfarin (COUMADIN) 5 MG tablet TAKE AS DIRECTED     Review of Systems  Constitution: Positive for malaise/fatigue. Negative for decreased appetite, weight gain and weight loss.  Eyes: Negative for visual disturbance.  Cardiovascular: Positive for chest pain. Negative for claudication, dyspnea on exertion, leg swelling, orthopnea, palpitations and syncope.  Respiratory: Negative for hemoptysis and wheezing.   Endocrine: Negative for cold intolerance and heat intolerance.  Hematologic/Lymphatic: Does not bruise/bleed easily.  Skin: Negative for nail changes.  Musculoskeletal: Negative for muscle weakness and myalgias.  Gastrointestinal: Positive for hemorrhoids (bleeding). Negative for abdominal pain, change in bowel habit, constipation, nausea and vomiting.  Neurological: Negative for difficulty with concentration, dizziness, focal weakness and headaches.  Psychiatric/Behavioral: Negative for altered mental status and suicidal ideas.  All other systems reviewed and are negative.      Objective:     Blood pressure (!) 113/51, pulse (!) 58, height 5\' 5"  (1.651 m), weight 235 lb 12.8 oz (107 kg), SpO2 97 %.  Echocardiogram 11/24/2017: Left ventricle cavity is normal in size. Mild concentric hypertrophy of the left ventricle. Normal  global wall motion. Doppler evidence of grade II (pseudonormal) diastolic dysfunction, elevated LAP. Calculated EF 65%. Left atrial cavity is severely dilated. Mildly restricted aortic valve leaflets with trace aortic stenosis. Aortic valve mean gradient of  6 mmHg, Vmax of 1.8 m/s. Calculated aortic valve area by continuity equation is 2.1 cm. Mild (Grade I) mitral regurgitation. Mild to moderate tricuspid regurgitation. Estimated pulmonary artery systolic pressure 09-32 mmHg.  Lexiscan myoview stress test 09/06/2018:  1. Lexiscan stress test was performed. Exercise capacity was not assessed. Stress symptoms included dyspnea, dizziness. Peak effect blood pressure was 158/68 mmHg. The resting and stress electrocardiogram demonstrated normal sinus rhythm, normal resting conduction, no resting arrhythmias and normal rest  repolarization.  Stress EKG is non diagnostic for ischemia as it is a pharmacologic stress.  2. The overall quality of the study is good. There is no evidence of abnormal lung activity. Stress and rest SPECT images demonstrate homogeneous tracer distribution throughout the myocardium. Gated SPECT imaging reveals normal myocardial thickening and wall motion. The left ventricular ejection fraction was normal (61%).   3. Low risk study.  Carotid artery duplex 12/02/2016: No hemodynamically significant arterial disease in the internal carotid artery bilaterally. There is mild calcific plaque bilaterally. Left carotid is mildly tortuous and may be a source of bruit.  Antegrade  vertebral artery flow.  Physical Exam  Constitutional: She is oriented to person, place, and time. Vital signs are normal. She appears well-developed and well-nourished.  HENT:  Head: Normocephalic and atraumatic.  Neck: Normal range of motion.  Cardiovascular: Normal rate, regular rhythm, normal heart sounds and intact distal pulses.  Pulses:      Carotid pulses are on the right side with bruit and on the  left side with bruit. Right AV fistula/graft  Pulmonary/Chest: Effort normal and breath sounds normal. No accessory muscle usage. No respiratory distress.  Abdominal: Soft. Bowel sounds are normal.  Musculoskeletal: Normal range of motion.  Neurological: She is alert and oriented to person, place, and time.  Skin: Skin is warm and dry.  Vitals reviewed.          Assessment & Recommendations:   1. Fatigue, unspecified type I have discussed recently obtained lexiscan nuclear stress test, no evidence of ischemia. I suspect that her fatigue as well as her chest pain may be related to iron deficiency anemia. She is scheduled for iron transfusion this week.   2. Paroxysmal atrial fibrillation (H[facility]) Continues to maintain sinus rhythm. On amiodarone and tolerating this well.   3. Orthostatic hypotension She continues to be orthostatic today; however, is not wearing support stockings today. Her symptoms improved with wearing support stockings.   4. ESRD on hemodialysis ([place]) Tolerating well. Continue management per Nephrology  5. Hypertension with renal disease Stable with current medical therapy. No changes today.  6. Chronic anticoagulation INR has improved from before and is in range. Unsure of her previous dose before INR level of 6.3 due to changes in our EHR system. Will continue with current dose and recheck in 2 weeks for close monitoring given her recent elevation.  7. Iron deficiency anemia, unspecified iron deficiency anemia type Scheduled for iron transfusion this week and suspect etiology for her symptoms. She does have bleeding hemorrhoids and may consider GI evaluation.   I will see her back in 8 weeks for close monitoring of her symptoms.     Jeri Lager, FNP-C [facility], [place] Office: [phone] Fax: [phone]

## 2018-09-17 DIAGNOSIS — N186 End stage renal disease: Secondary | ICD-10-CM | POA: Diagnosis not present

## 2018-09-17 DIAGNOSIS — N2581 Secondary hyperparathyroidism of renal origin: Secondary | ICD-10-CM | POA: Diagnosis not present

## 2018-09-17 DIAGNOSIS — D509 Iron deficiency anemia, unspecified: Secondary | ICD-10-CM | POA: Diagnosis not present

## 2018-09-17 DIAGNOSIS — D631 Anemia in chronic kidney disease: Secondary | ICD-10-CM | POA: Diagnosis not present

## 2018-09-17 NOTE — Telephone Encounter (Signed)
Noted. Nothing further needed at this time.  

## 2018-09-19 DIAGNOSIS — Z992 Dependence on renal dialysis: Secondary | ICD-10-CM | POA: Diagnosis not present

## 2018-09-19 DIAGNOSIS — N186 End stage renal disease: Secondary | ICD-10-CM | POA: Diagnosis not present

## 2018-09-19 DIAGNOSIS — I158 Other secondary hypertension: Secondary | ICD-10-CM | POA: Diagnosis not present

## 2018-09-20 DIAGNOSIS — N186 End stage renal disease: Secondary | ICD-10-CM | POA: Diagnosis not present

## 2018-09-20 DIAGNOSIS — D509 Iron deficiency anemia, unspecified: Secondary | ICD-10-CM | POA: Diagnosis not present

## 2018-09-20 DIAGNOSIS — N2581 Secondary hyperparathyroidism of renal origin: Secondary | ICD-10-CM | POA: Diagnosis not present

## 2018-09-20 DIAGNOSIS — D631 Anemia in chronic kidney disease: Secondary | ICD-10-CM | POA: Diagnosis not present

## 2018-09-21 ENCOUNTER — Ambulatory Visit (INDEPENDENT_AMBULATORY_CARE_PROVIDER_SITE_OTHER): Payer: Medicare Other | Admitting: Cardiology

## 2018-09-21 DIAGNOSIS — I480 Paroxysmal atrial fibrillation: Secondary | ICD-10-CM

## 2018-09-21 DIAGNOSIS — Z7901 Long term (current) use of anticoagulants: Secondary | ICD-10-CM | POA: Diagnosis not present

## 2018-09-21 LAB — POCT INR
INR: 2.7 (ref 2.0–3.0)
INR: 2.7 (ref 2.0–3.0)

## 2018-09-21 NOTE — Progress Notes (Signed)
INR is therapuetic today and she is tolerating well. No changes made today. Will repeat INR in 1 month

## 2018-09-22 ENCOUNTER — Ambulatory Visit: Payer: Medicare Other | Admitting: Family Medicine

## 2018-09-22 ENCOUNTER — Encounter: Payer: Self-pay | Admitting: Family Medicine

## 2018-09-22 ENCOUNTER — Ambulatory Visit (INDEPENDENT_AMBULATORY_CARE_PROVIDER_SITE_OTHER): Payer: Medicare Other | Admitting: Family Medicine

## 2018-09-22 VITALS — BP 120/60 | HR 62 | Temp 98.6°F | Resp 12 | Ht 65.0 in | Wt 238.0 lb

## 2018-09-22 DIAGNOSIS — R5383 Other fatigue: Secondary | ICD-10-CM

## 2018-09-22 DIAGNOSIS — G4700 Insomnia, unspecified: Secondary | ICD-10-CM

## 2018-09-22 DIAGNOSIS — D638 Anemia in other chronic diseases classified elsewhere: Secondary | ICD-10-CM | POA: Diagnosis not present

## 2018-09-22 DIAGNOSIS — F331 Major depressive disorder, recurrent, moderate: Secondary | ICD-10-CM | POA: Insufficient documentation

## 2018-09-22 LAB — CBC WITH DIFFERENTIAL/PLATELET
Basophils Absolute: 0 10*3/uL (ref 0.0–0.1)
Basophils Relative: 0.5 % (ref 0.0–3.0)
Eosinophils Absolute: 0.6 10*3/uL (ref 0.0–0.7)
Eosinophils Relative: 10.5 % — ABNORMAL HIGH (ref 0.0–5.0)
HCT: 33 % — ABNORMAL LOW (ref 36.0–46.0)
Hemoglobin: 10.7 g/dL — ABNORMAL LOW (ref 12.0–15.0)
Lymphocytes Relative: 23.5 % (ref 12.0–46.0)
Lymphs Abs: 1.3 10*3/uL (ref 0.7–4.0)
MCHC: 32.5 g/dL (ref 30.0–36.0)
MCV: 93.6 fl (ref 78.0–100.0)
Monocytes Absolute: 0.7 10*3/uL (ref 0.1–1.0)
Monocytes Relative: 12.9 % — ABNORMAL HIGH (ref 3.0–12.0)
NEUTROS PCT: 52.6 % (ref 43.0–77.0)
Neutro Abs: 2.9 10*3/uL (ref 1.4–7.7)
Platelets: 234 10*3/uL (ref 150.0–400.0)
RBC: 3.52 Mil/uL — AB (ref 3.87–5.11)
RDW: 17.1 % — ABNORMAL HIGH (ref 11.5–15.5)
WBC: 5.5 10*3/uL (ref 4.0–10.5)

## 2018-09-22 LAB — TSH: TSH: 0.94 u[IU]/mL (ref 0.35–4.50)

## 2018-09-22 MED ORDER — MIRTAZAPINE 15 MG PO TABS: 15.0000 mg | ORAL_TABLET | Freq: Every day | ORAL | 0 refills | Status: DC

## 2018-09-22 NOTE — Patient Instructions (Signed)
A few things to remember from today's visit:   Other fatigue - Plan: TSH  Iron deficiency anemia, unspecified iron deficiency anemia type - Plan: CBC with Differential/Platelet  Anemia in other chronic diseases classified elsewhere  Today we are starting Remeron, take half tablet, you can increase it to the whole tablet in a week if you are tolerating well and if needed.  Please be sure medication list is accurate. If a new problem present, please set up appointment sooner than planned today.

## 2018-09-22 NOTE — Progress Notes (Signed)
ACUTE VISIT   HPI:  Chief Complaint  Patient presents with  . Discuss blood loss    patient feeling tired and weak    Jane [name] is a 73 y.o. female, who is here today complaining of fatigue and feeling weak. Problem has been going on for a while (since she started dialysis) but it is getting worse.  ESRD,having dialysis 3 times per week.She has to get up around 3:30 am during dialysis days.  Difficulty falling asleep. She goes to bed around 6 am and sometimes can not sleep until 1:00 Am.  OSA, she did not tolerate CPAP.She felt like mask aggravated problem. + snores and mouth breathing.  Hx of iron dn chronic disease anemia. She received erythropoietin x 4 recently. Hg 9.5 on 09/14/18, 9.8 09/10/18,10 on 09/02/18. Concerned about anemia,"blood loss",requesting lab work.   She has not noted melenas. No changes in bowel habits. Constipation on Linzess,it causes diarrhea, takes med prn. Occasional blood with defecation, attributed to straining. Reporting Hx of hemorrhoids.   Atrial fib on Coumadin. According to pt,she was having bleeding from needle sticks during dialysis,improved after Coumadin was adjusted.  Depressed mood. She misses her family,most live in [place].Her children try to visit annually. She was planning on going but postpone travel due to coronavirus cases in [place]. Also frustrated about limitations,she cannot do what she wants to do.She cannot drive due to vision problems.  Depression screen [facility] 2/9 09/22/2018 05/11/2018 01/26/2018 10/16/2016  Decreased Interest 1 0 0 0  Down, Depressed, Hopeless 1 0 0 0  PHQ - 2 Score 2 0 0 0  Altered sleeping 3 - - -  Tired, decreased energy 3 - - -  Change in appetite 3 - - -  Feeling bad or failure about yourself  0 - - -  Trouble concentrating 1 - - -  Moving slowly or fidgety/restless 0 - - -  Suicidal thoughts 1 - - -  PHQ-9 Score 13 - - -  Difficult doing work/chores Somewhat difficult - - -      Review of Systems  Constitutional: Positive for activity change and fatigue. Negative for appetite change and fever.  HENT: Negative for mouth sores, nosebleeds and trouble swallowing.   Respiratory: Negative for cough, shortness of breath and wheezing.   Cardiovascular: Negative for chest pain, palpitations and leg swelling.  Gastrointestinal: Positive for anal bleeding and constipation. Negative for abdominal pain, nausea and vomiting.       Negative for changes in bowel habits.  Genitourinary: Negative for decreased urine volume, dysuria and hematuria.  Musculoskeletal: Positive for arthralgias and gait problem.  Skin: Negative for rash and wound.  Neurological: Negative for syncope, weakness and headaches.  Psychiatric/Behavioral: Positive for sleep disturbance. Negative for confusion. The patient is nervous/anxious.     Current Outpatient Medications on File Prior to Visit  Medication Sig Dispense Refill  . albuterol (PROVENTIL HFA;VENTOLIN HFA) 108 (90 Base) MCG/ACT inhaler Inhale 1 puff into the lungs every 6 (six) hours as needed for wheezing or shortness of breath. 6.7 g 2  . Amino Acids-Protein Hydrolys (FEEDING SUPPLEMENT, PRO-STAT SUGAR FREE 64,) LIQD Take 30 mLs by mouth 2 (two) times daily. 900 mL 0  . amiodarone (PACERONE) 200 MG tablet TAKE 1 TABLET BY MOUTH ONCE DAILY 90 tablet 1  . b complex vitamins capsule Take by mouth.    . B Complex-C-Zn-Folic Acid (DIALYVITE 277-OEUM 15) 0.8 MG TABS TAKE 1 TABLET BY MOUTH ONCE A DAY WITH  DINNER (FIRST MEAL AFTER DIALYSIS)    . carvedilol (COREG) 25 MG tablet Take 0.5 tablets (12.5 mg total) by mouth 2 (two) times daily with a meal. 360 tablet 0  . cinacalcet (SENSIPAR) 60 MG tablet Take 60 mg by mouth every other day.    . ethyl chloride spray USE 1 TO 2 SPRAYS TO DIALYSIS ACCESS EVERY TREATMENT  12  . linaclotide (LINZESS) 145 MCG CAPS capsule Take 1 capsule (145 mcg total) by mouth daily before breakfast. 90 capsule 2  .  lubiprostone (AMITIZA) 8 MCG capsule Take 1 capsule (8 mcg total) by mouth daily with breakfast. 30 capsule 1  . multivitamin (RENA-VIT) TABS tablet Take 1 tablet by mouth daily.    Marland Kitchen nystatin cream (MYCOSTATIN) Apply 1 application topically 2 (two) times daily. 30 g 2  . omeprazole (PRILOSEC) 20 MG capsule Take 1 capsule (20 mg total) by mouth daily before breakfast. 30 capsule 1  . sevelamer carbonate (RENVELA) 800 MG tablet Take 800-1,600 mg by mouth See admin instructions. 1,600 mg after meals TWO times a day and 800 mg after each snack    . triamcinolone cream (KENALOG) 0.1 % Apply 1 application topically daily as needed. Small amount,external use. 30 g 0  . warfarin (COUMADIN) 5 MG tablet TAKE AS DIRECTED 50 tablet 3   No current facility-administered medications on file prior to visit.      Past Medical History:  Diagnosis Date  . Acid reflux   . Atrial fibrillation ([employer])   . Bilateral carotid bruits   . Hypertension   . Malaise and fatigue   . Orthostatic hypotension   . Renal disorder   . Shortness of breath   . Sleep apnea   . Syncope    Allergies  Allergen Reactions  . Latex Rash  . Other Other (See Comments)    The plastic tape irritates her skin.  Silicone tapes cause redness  . Penicillins Rash  . Sulfa Antibiotics Rash  . Tape Other (See Comments)    PLASTIC TAPE TEARS OFF THE SKIN AND BRUISES IT TERRIBLY!! GETS ITCHY AND RASH FROM PAPER AND PLASTIC TAPES PLASTIC TAPE TEARS OFF THE SKIN AND BRUISES IT TERRIBLY!! PLASTIC TAPE TEARS OFF THE SKIN AND BRUISES IT TERRIBLY!!    Social History   Socioeconomic History  . Marital status: Divorced    Spouse name: Not on file  . Number of children: 4  . Years of education: Not on file  . Highest education level: Not on file  Occupational History  . Not on file  Social Needs  . Financial resource strain: Not on file  . Food insecurity:    Worry: Not on file    Inability: Not on file  . Transportation needs:     Medical: Not on file    Non-medical: Not on file  Tobacco Use  . Smoking status: Never Smoker  . Smokeless tobacco: Never Used  Substance and Sexual Activity  . Alcohol use: No  . Drug use: No  . Sexual activity: Never  Lifestyle  . Physical activity:    Days per week: Not on file    Minutes per session: Not on file  . Stress: Not on file  Relationships  . Social connections:    Talks on phone: Not on file    Gets together: Not on file    Attends religious service: Not on file    Active member of club or organization: Not on file    Attends  meetings of clubs or organizations: Not on file    Relationship status: Not on file  Other Topics Concern  . Not on file  Social History Narrative  . Not on file    Vitals:   09/22/18 0959  BP: 120/60  Pulse: 62  Resp: 12  Temp: 98.6 F (37 C)  SpO2: 95%   Body mass index is 39.61 kg/m.   Physical Exam  Nursing note and vitals reviewed. Constitutional: She is oriented to person, place, and time. She appears well-developed. No distress.  HENT:  Head: Normocephalic and atraumatic.  Mouth/Throat: Oropharynx is clear and moist and mucous membranes are normal.  Eyes: Pupils are equal, round, and reactive to light. Conjunctivae are normal.  Cardiovascular: Normal rate and regular rhythm.  Murmur (SEM I-II LUSB-RUSB) heard. Respiratory: Effort normal and breath sounds normal. No respiratory distress.  GI: Soft. She exhibits no mass. There is no abdominal tenderness.  Musculoskeletal:        General: Edema (Trace pitting edema LE, bilateral) present.  Neurological: She is alert and oriented to person, place, and time. She has normal strength. No cranial nerve deficit. Gait normal.  Skin: Skin is warm. No rash noted. No erythema.  Psychiatric: Her mood appears anxious.  Well groomed, good eye contact.   ASSESSMENT AND PLAN:  Jane [name] was seen today for discuss blood loss.  Diagnoses and all orders for this visit: \ Lab  Results  Component Value Date   TSH 0.94 09/22/2018   Lab Results  Component Value Date   WBC 5.5 09/22/2018   HGB 10.7 (L) 09/22/2018   HCT 33.0 (L) 09/22/2018   MCV 93.6 09/22/2018   PLT 234.0 09/22/2018    Other fatigue :Possible etiologies discussed. Chronic medical problems,meds,and sleep issues can all be contributing factors.  -     TSH  Insomnia, unspecified type Good sleep hygiene. Re,meron may help with sleep.  -     mirtazapine (REMERON) 15 MG tablet; Take 1 tablet (15 mg total) by mouth at bedtime.  Anemia in other chronic diseases classified elsewhere We discussed etiologies,irone def and chronic disease.  -     CBC with Differential/Platelet  Depression, major, recurrent, moderate (HCC) Remeron side effects discussed. Recommend starting with 7.5 mg and titrate up to 15 mg as tolerated. Instructed about warning signs.  -     mirtazapine (REMERON) 15 MG tablet; Take 1 tablet (15 mg total) by mouth at bedtime.    Return in about 4 weeks (around 10/20/2018) for depression.     [name]. [name], MD  [facility]. [place] office.

## 2018-09-23 DIAGNOSIS — N186 End stage renal disease: Secondary | ICD-10-CM | POA: Diagnosis not present

## 2018-09-23 DIAGNOSIS — N2581 Secondary hyperparathyroidism of renal origin: Secondary | ICD-10-CM | POA: Diagnosis not present

## 2018-09-23 DIAGNOSIS — D631 Anemia in chronic kidney disease: Secondary | ICD-10-CM | POA: Diagnosis not present

## 2018-09-23 DIAGNOSIS — D509 Iron deficiency anemia, unspecified: Secondary | ICD-10-CM | POA: Diagnosis not present

## 2018-09-24 DIAGNOSIS — N2581 Secondary hyperparathyroidism of renal origin: Secondary | ICD-10-CM | POA: Diagnosis not present

## 2018-09-24 DIAGNOSIS — D631 Anemia in chronic kidney disease: Secondary | ICD-10-CM | POA: Diagnosis not present

## 2018-09-24 DIAGNOSIS — N186 End stage renal disease: Secondary | ICD-10-CM | POA: Diagnosis not present

## 2018-09-24 DIAGNOSIS — D509 Iron deficiency anemia, unspecified: Secondary | ICD-10-CM | POA: Diagnosis not present

## 2018-09-25 ENCOUNTER — Encounter: Payer: Self-pay | Admitting: Family Medicine

## 2018-09-27 DIAGNOSIS — D509 Iron deficiency anemia, unspecified: Secondary | ICD-10-CM | POA: Diagnosis not present

## 2018-09-27 DIAGNOSIS — D631 Anemia in chronic kidney disease: Secondary | ICD-10-CM | POA: Diagnosis not present

## 2018-09-27 DIAGNOSIS — N186 End stage renal disease: Secondary | ICD-10-CM | POA: Diagnosis not present

## 2018-09-27 DIAGNOSIS — N2581 Secondary hyperparathyroidism of renal origin: Secondary | ICD-10-CM | POA: Diagnosis not present

## 2018-09-29 DIAGNOSIS — D509 Iron deficiency anemia, unspecified: Secondary | ICD-10-CM | POA: Diagnosis not present

## 2018-09-29 DIAGNOSIS — D631 Anemia in chronic kidney disease: Secondary | ICD-10-CM | POA: Diagnosis not present

## 2018-09-29 DIAGNOSIS — N2581 Secondary hyperparathyroidism of renal origin: Secondary | ICD-10-CM | POA: Diagnosis not present

## 2018-09-29 DIAGNOSIS — N186 End stage renal disease: Secondary | ICD-10-CM | POA: Diagnosis not present

## 2018-10-01 DIAGNOSIS — D631 Anemia in chronic kidney disease: Secondary | ICD-10-CM | POA: Diagnosis not present

## 2018-10-01 DIAGNOSIS — N2581 Secondary hyperparathyroidism of renal origin: Secondary | ICD-10-CM | POA: Diagnosis not present

## 2018-10-01 DIAGNOSIS — N186 End stage renal disease: Secondary | ICD-10-CM | POA: Diagnosis not present

## 2018-10-01 DIAGNOSIS — D509 Iron deficiency anemia, unspecified: Secondary | ICD-10-CM | POA: Diagnosis not present

## 2018-10-04 DIAGNOSIS — N186 End stage renal disease: Secondary | ICD-10-CM | POA: Diagnosis not present

## 2018-10-04 DIAGNOSIS — D631 Anemia in chronic kidney disease: Secondary | ICD-10-CM | POA: Diagnosis not present

## 2018-10-04 DIAGNOSIS — D509 Iron deficiency anemia, unspecified: Secondary | ICD-10-CM | POA: Diagnosis not present

## 2018-10-04 DIAGNOSIS — N2581 Secondary hyperparathyroidism of renal origin: Secondary | ICD-10-CM | POA: Diagnosis not present

## 2018-10-06 DIAGNOSIS — D509 Iron deficiency anemia, unspecified: Secondary | ICD-10-CM | POA: Diagnosis not present

## 2018-10-06 DIAGNOSIS — N2581 Secondary hyperparathyroidism of renal origin: Secondary | ICD-10-CM | POA: Diagnosis not present

## 2018-10-06 DIAGNOSIS — D631 Anemia in chronic kidney disease: Secondary | ICD-10-CM | POA: Diagnosis not present

## 2018-10-06 DIAGNOSIS — N186 End stage renal disease: Secondary | ICD-10-CM | POA: Diagnosis not present

## 2018-10-08 DIAGNOSIS — D509 Iron deficiency anemia, unspecified: Secondary | ICD-10-CM | POA: Diagnosis not present

## 2018-10-08 DIAGNOSIS — D631 Anemia in chronic kidney disease: Secondary | ICD-10-CM | POA: Diagnosis not present

## 2018-10-08 DIAGNOSIS — N2581 Secondary hyperparathyroidism of renal origin: Secondary | ICD-10-CM | POA: Diagnosis not present

## 2018-10-08 DIAGNOSIS — N186 End stage renal disease: Secondary | ICD-10-CM | POA: Diagnosis not present

## 2018-10-11 DIAGNOSIS — N2581 Secondary hyperparathyroidism of renal origin: Secondary | ICD-10-CM | POA: Diagnosis not present

## 2018-10-11 DIAGNOSIS — D631 Anemia in chronic kidney disease: Secondary | ICD-10-CM | POA: Diagnosis not present

## 2018-10-11 DIAGNOSIS — D509 Iron deficiency anemia, unspecified: Secondary | ICD-10-CM | POA: Diagnosis not present

## 2018-10-11 DIAGNOSIS — N186 End stage renal disease: Secondary | ICD-10-CM | POA: Diagnosis not present

## 2018-10-13 DIAGNOSIS — N186 End stage renal disease: Secondary | ICD-10-CM | POA: Diagnosis not present

## 2018-10-13 DIAGNOSIS — D631 Anemia in chronic kidney disease: Secondary | ICD-10-CM | POA: Diagnosis not present

## 2018-10-13 DIAGNOSIS — D509 Iron deficiency anemia, unspecified: Secondary | ICD-10-CM | POA: Diagnosis not present

## 2018-10-13 DIAGNOSIS — N2581 Secondary hyperparathyroidism of renal origin: Secondary | ICD-10-CM | POA: Diagnosis not present

## 2018-10-15 DIAGNOSIS — D631 Anemia in chronic kidney disease: Secondary | ICD-10-CM | POA: Diagnosis not present

## 2018-10-15 DIAGNOSIS — N186 End stage renal disease: Secondary | ICD-10-CM | POA: Diagnosis not present

## 2018-10-15 DIAGNOSIS — N2581 Secondary hyperparathyroidism of renal origin: Secondary | ICD-10-CM | POA: Diagnosis not present

## 2018-10-15 DIAGNOSIS — D509 Iron deficiency anemia, unspecified: Secondary | ICD-10-CM | POA: Diagnosis not present

## 2018-10-18 DIAGNOSIS — N186 End stage renal disease: Secondary | ICD-10-CM | POA: Diagnosis not present

## 2018-10-18 DIAGNOSIS — D509 Iron deficiency anemia, unspecified: Secondary | ICD-10-CM | POA: Diagnosis not present

## 2018-10-18 DIAGNOSIS — N2581 Secondary hyperparathyroidism of renal origin: Secondary | ICD-10-CM | POA: Diagnosis not present

## 2018-10-18 DIAGNOSIS — D631 Anemia in chronic kidney disease: Secondary | ICD-10-CM | POA: Diagnosis not present

## 2018-10-19 ENCOUNTER — Ambulatory Visit: Payer: Medicare Other | Admitting: Family Medicine

## 2018-10-20 DIAGNOSIS — D509 Iron deficiency anemia, unspecified: Secondary | ICD-10-CM | POA: Diagnosis not present

## 2018-10-20 DIAGNOSIS — D631 Anemia in chronic kidney disease: Secondary | ICD-10-CM | POA: Diagnosis not present

## 2018-10-20 DIAGNOSIS — N186 End stage renal disease: Secondary | ICD-10-CM | POA: Diagnosis not present

## 2018-10-20 DIAGNOSIS — E1129 Type 2 diabetes mellitus with other diabetic kidney complication: Secondary | ICD-10-CM | POA: Diagnosis not present

## 2018-10-20 DIAGNOSIS — I158 Other secondary hypertension: Secondary | ICD-10-CM | POA: Diagnosis not present

## 2018-10-20 DIAGNOSIS — N2581 Secondary hyperparathyroidism of renal origin: Secondary | ICD-10-CM | POA: Diagnosis not present

## 2018-10-20 DIAGNOSIS — Z992 Dependence on renal dialysis: Secondary | ICD-10-CM | POA: Diagnosis not present

## 2018-10-21 ENCOUNTER — Ambulatory Visit: Payer: Medicare Other | Admitting: Cardiology

## 2018-10-22 DIAGNOSIS — D509 Iron deficiency anemia, unspecified: Secondary | ICD-10-CM | POA: Diagnosis not present

## 2018-10-22 DIAGNOSIS — N2581 Secondary hyperparathyroidism of renal origin: Secondary | ICD-10-CM | POA: Diagnosis not present

## 2018-10-22 DIAGNOSIS — D631 Anemia in chronic kidney disease: Secondary | ICD-10-CM | POA: Diagnosis not present

## 2018-10-22 DIAGNOSIS — N186 End stage renal disease: Secondary | ICD-10-CM | POA: Diagnosis not present

## 2018-10-25 DIAGNOSIS — D631 Anemia in chronic kidney disease: Secondary | ICD-10-CM | POA: Diagnosis not present

## 2018-10-25 DIAGNOSIS — N186 End stage renal disease: Secondary | ICD-10-CM | POA: Diagnosis not present

## 2018-10-25 DIAGNOSIS — N2581 Secondary hyperparathyroidism of renal origin: Secondary | ICD-10-CM | POA: Diagnosis not present

## 2018-10-25 DIAGNOSIS — D509 Iron deficiency anemia, unspecified: Secondary | ICD-10-CM | POA: Diagnosis not present

## 2018-10-27 DIAGNOSIS — D509 Iron deficiency anemia, unspecified: Secondary | ICD-10-CM | POA: Diagnosis not present

## 2018-10-27 DIAGNOSIS — N186 End stage renal disease: Secondary | ICD-10-CM | POA: Diagnosis not present

## 2018-10-27 DIAGNOSIS — D631 Anemia in chronic kidney disease: Secondary | ICD-10-CM | POA: Diagnosis not present

## 2018-10-27 DIAGNOSIS — N2581 Secondary hyperparathyroidism of renal origin: Secondary | ICD-10-CM | POA: Diagnosis not present

## 2018-10-28 NOTE — Telephone Encounter (Signed)
Created in error

## 2018-10-29 DIAGNOSIS — N2581 Secondary hyperparathyroidism of renal origin: Secondary | ICD-10-CM | POA: Diagnosis not present

## 2018-10-29 DIAGNOSIS — D631 Anemia in chronic kidney disease: Secondary | ICD-10-CM | POA: Diagnosis not present

## 2018-10-29 DIAGNOSIS — D689 Coagulation defect, unspecified: Secondary | ICD-10-CM | POA: Diagnosis not present

## 2018-10-29 DIAGNOSIS — D509 Iron deficiency anemia, unspecified: Secondary | ICD-10-CM | POA: Diagnosis not present

## 2018-10-29 DIAGNOSIS — N186 End stage renal disease: Secondary | ICD-10-CM | POA: Diagnosis not present

## 2018-11-01 DIAGNOSIS — D509 Iron deficiency anemia, unspecified: Secondary | ICD-10-CM | POA: Diagnosis not present

## 2018-11-01 DIAGNOSIS — N2581 Secondary hyperparathyroidism of renal origin: Secondary | ICD-10-CM | POA: Diagnosis not present

## 2018-11-01 DIAGNOSIS — D631 Anemia in chronic kidney disease: Secondary | ICD-10-CM | POA: Diagnosis not present

## 2018-11-01 DIAGNOSIS — N186 End stage renal disease: Secondary | ICD-10-CM | POA: Diagnosis not present

## 2018-11-03 DIAGNOSIS — D509 Iron deficiency anemia, unspecified: Secondary | ICD-10-CM | POA: Diagnosis not present

## 2018-11-03 DIAGNOSIS — N186 End stage renal disease: Secondary | ICD-10-CM | POA: Diagnosis not present

## 2018-11-03 DIAGNOSIS — N2581 Secondary hyperparathyroidism of renal origin: Secondary | ICD-10-CM | POA: Diagnosis not present

## 2018-11-03 DIAGNOSIS — D631 Anemia in chronic kidney disease: Secondary | ICD-10-CM | POA: Diagnosis not present

## 2018-11-05 DIAGNOSIS — N2581 Secondary hyperparathyroidism of renal origin: Secondary | ICD-10-CM | POA: Diagnosis not present

## 2018-11-05 DIAGNOSIS — N186 End stage renal disease: Secondary | ICD-10-CM | POA: Diagnosis not present

## 2018-11-05 DIAGNOSIS — D509 Iron deficiency anemia, unspecified: Secondary | ICD-10-CM | POA: Diagnosis not present

## 2018-11-05 DIAGNOSIS — D631 Anemia in chronic kidney disease: Secondary | ICD-10-CM | POA: Diagnosis not present

## 2018-11-08 DIAGNOSIS — D509 Iron deficiency anemia, unspecified: Secondary | ICD-10-CM | POA: Diagnosis not present

## 2018-11-08 DIAGNOSIS — N2581 Secondary hyperparathyroidism of renal origin: Secondary | ICD-10-CM | POA: Diagnosis not present

## 2018-11-08 DIAGNOSIS — D631 Anemia in chronic kidney disease: Secondary | ICD-10-CM | POA: Diagnosis not present

## 2018-11-08 DIAGNOSIS — N186 End stage renal disease: Secondary | ICD-10-CM | POA: Diagnosis not present

## 2018-11-09 ENCOUNTER — Ambulatory Visit (INDEPENDENT_AMBULATORY_CARE_PROVIDER_SITE_OTHER): Payer: Medicare Other | Admitting: Cardiology

## 2018-11-09 ENCOUNTER — Other Ambulatory Visit: Payer: Self-pay

## 2018-11-09 DIAGNOSIS — Z7901 Long term (current) use of anticoagulants: Secondary | ICD-10-CM

## 2018-11-09 DIAGNOSIS — Z5181 Encounter for therapeutic drug level monitoring: Secondary | ICD-10-CM | POA: Diagnosis not present

## 2018-11-09 DIAGNOSIS — I480 Paroxysmal atrial fibrillation: Secondary | ICD-10-CM

## 2018-11-09 DIAGNOSIS — I4891 Unspecified atrial fibrillation: Secondary | ICD-10-CM

## 2018-11-09 LAB — POCT INR
INR: 3.1 — AB (ref 2.0–3.0)
INR: 3.1 — AB (ref 2.0–3.0)

## 2018-11-10 DIAGNOSIS — N2581 Secondary hyperparathyroidism of renal origin: Secondary | ICD-10-CM | POA: Diagnosis not present

## 2018-11-10 DIAGNOSIS — D509 Iron deficiency anemia, unspecified: Secondary | ICD-10-CM | POA: Diagnosis not present

## 2018-11-10 DIAGNOSIS — N186 End stage renal disease: Secondary | ICD-10-CM | POA: Diagnosis not present

## 2018-11-10 DIAGNOSIS — D631 Anemia in chronic kidney disease: Secondary | ICD-10-CM | POA: Diagnosis not present

## 2018-11-11 ENCOUNTER — Ambulatory Visit (INDEPENDENT_AMBULATORY_CARE_PROVIDER_SITE_OTHER): Payer: Medicare Other | Admitting: Cardiology

## 2018-11-11 ENCOUNTER — Encounter: Payer: Self-pay | Admitting: Cardiology

## 2018-11-11 ENCOUNTER — Other Ambulatory Visit: Payer: Self-pay

## 2018-11-11 VITALS — BP 153/58 | HR 60 | Ht 65.0 in | Wt 243.0 lb

## 2018-11-11 DIAGNOSIS — R5383 Other fatigue: Secondary | ICD-10-CM | POA: Diagnosis not present

## 2018-11-11 DIAGNOSIS — I951 Orthostatic hypotension: Secondary | ICD-10-CM | POA: Diagnosis not present

## 2018-11-11 DIAGNOSIS — R0789 Other chest pain: Secondary | ICD-10-CM | POA: Diagnosis not present

## 2018-11-11 DIAGNOSIS — N186 End stage renal disease: Secondary | ICD-10-CM

## 2018-11-11 DIAGNOSIS — Z992 Dependence on renal dialysis: Secondary | ICD-10-CM

## 2018-11-11 DIAGNOSIS — I480 Paroxysmal atrial fibrillation: Secondary | ICD-10-CM | POA: Diagnosis not present

## 2018-11-11 NOTE — Progress Notes (Addendum)
Subjective:   Jane [name], female    DOB: 04-28-1946, 73 y.o.   MRN: [id number]  [name], [name], MD:  Chief Complaint  Patient presents with  . Anemia  . Fatigue  . Follow-up    8wk   This visit type was conducted due to national recommendations for restrictions regarding the COVID-19 Pandemic (e.g. social distancing).  This format is felt to be most appropriate for this patient at this time.  All issues noted in this document were discussed and addressed.  No physical exam was performed (except for noted visual exam findings with Telehealth visits).  The patient has consented to conduct a Telehealth visit and understands insurance will be billed.   I discussed the limitations of evaluation and management by telemedicine and the availability of in person appointments. The patient expressed understanding and agreed to proceed.  Virtual Visit via Video Note is as below  I connected with Ms. Norenberg, on 11/11/18 at 1040 by telephone and verified that I am speaking with the correct person using two identifiers. Unable to perform video visit due to patient not having needed equipment.    I have discussed with her regarding the safety during COVID Pandemic and steps and precautions including social distancing with the patient.    HPI: Jane [name]  is a 73 y.o. female  with paroxysmal atrial fibrillation, diagnosed sometime in 1999, has had cardioversion in 2010 while she was in [place] and has been on amiodarone since then. Amiodarone recently decreased due to QT prolongation. She has end-stage renal disease and is presently on hemodialysis on M-W-F. Echocardiogram in April 2019 revealed grade 2 diastolic dysfucntion, trace aortic stenosis, and elevated pulmonary pressures.  Patient was seen in January for acute visit for dizziness found to be related to orthostatic hypotension. Also had some episodes of chest pain. Lexiscan nuclear stress test on 09/06/2018 was performed that was considered  low risk study.     Initially, her dizziness had improved with use of support stockings; however, she is again having problems with this for the last one month. Reports that her blood pressure has been all over the place. States her BP drops low at dialysis and reports one episode where her BP was 60/40 after she got home. She has been given a prescription for Midrodrine by her nephrologist to use as needed. She also has elevated blood pressure on days she does not go to dialysis and does not feel well with this either. Continues to have fatigue and occasional chest pain. Chest pain occurs at rest and she describes as feeling like a muscle spasm that radiates back to her her shoulder, but improves with lying down and stretching out.  Has history of anemia and has undergone iron transfusions and states her levels are now normal. She continues to have fatigue. Also has hemorrhoids that occasionally bleed and is on linzess for management.  INR was last checked 3 days ago and stable at 3.1.    Past Medical History:  Diagnosis Date  . Acid reflux   . Atrial fibrillation ([place])   . Bilateral carotid bruits   . Hypertension   . Malaise and fatigue   . Orthostatic hypotension   . Renal disorder   . Shortness of breath   . Sleep apnea   . Syncope     Past Surgical History:  Procedure Laterality Date  . BACK SURGERY    . colon removal  06/2016  . IR GENERIC HISTORICAL  07/09/2016   IR US GUIDE VASC ACCESS RIGHT 07/09/2016 Arne Cleveland, MD [facility]  . IR GENERIC HISTORICAL  07/09/2016   IR FLUORO GUIDE CV LINE RIGHT 07/09/2016 Arne Cleveland, MD [facility]  . KNEE ARTHROPLASTY    . LAPAROSCOPIC SIGMOID COLECTOMY N/A 07/11/2016   Procedure: LAPAROSCOPIC SIGMOID COLECTOMY;  Surgeon: Clovis Riley, MD;  Location: [place];  Service: General;  Laterality: N/A;  . TUBAL LIGATION      Family History  Problem Relation Age of Onset  . Heart failure Mother   . Stroke Mother   . Other  Father     Social History   Socioeconomic History  . Marital status: Divorced    Spouse name: Not on file  . Number of children: 4  . Years of education: Not on file  . Highest education level: Not on file  Occupational History  . Not on file  Social Needs  . Financial resource strain: Not on file  . Food insecurity:    Worry: Not on file    Inability: Not on file  . Transportation needs:    Medical: Not on file    Non-medical: Not on file  Tobacco Use  . Smoking status: Never Smoker  . Smokeless tobacco: Never Used  Substance and Sexual Activity  . Alcohol use: No  . Drug use: No  . Sexual activity: Never  Lifestyle  . Physical activity:    Days per week: Not on file    Minutes per session: Not on file  . Stress: Not on file  Relationships  . Social connections:    Talks on phone: Not on file    Gets together: Not on file    Attends religious service: Not on file    Active member of club or organization: Not on file    Attends meetings of clubs or organizations: Not on file    Relationship status: Not on file  . Intimate partner violence:    Fear of current or ex partner: Not on file    Emotionally abused: Not on file    Physically abused: Not on file    Forced sexual activity: Not on file  Other Topics Concern  . Not on file  Social History Narrative  . Not on file    Current Meds  Medication Sig  . albuterol (PROVENTIL HFA;VENTOLIN HFA) 108 (90 Base) MCG/ACT inhaler Inhale 1 puff into the lungs every 6 (six) hours as needed for wheezing or shortness of breath.  . Amino Acids-Protein Hydrolys (FEEDING SUPPLEMENT, PRO-STAT SUGAR FREE 64,) LIQD Take 30 mLs by mouth 2 (two) times daily.  Marland Kitchen amiodarone (PACERONE) 200 MG tablet TAKE 1 TABLET BY MOUTH ONCE DAILY (Patient taking differently: 100 mg. )  . b complex vitamins capsule Take by mouth.  . carvedilol (COREG) 25 MG tablet Take 0.5 tablets (12.5 mg total) by mouth 2 (two) times daily with a meal.  .  cinacalcet (SENSIPAR) 60 MG tablet Take 60 mg by mouth every other day.  . ethyl chloride spray USE 1 TO 2 SPRAYS TO DIALYSIS ACCESS EVERY TREATMENT  . linaclotide (LINZESS) 145 MCG CAPS capsule Take 1 capsule (145 mcg total) by mouth daily before breakfast.  . multivitamin (RENA-VIT) TABS tablet Take 1 tablet by mouth daily.  . sevelamer carbonate (RENVELA) 800 MG tablet Take 800-1,600 mg by mouth See admin instructions. 2 tabs 3 times daily  . warfarin (COUMADIN) 5 MG tablet TAKE AS DIRECTED (Patient taking differently: 5 mg.  7.5mg  T,  Thur; 5mg  all other days)  . [DISCONTINUED] lubiprostone (AMITIZA) 8 MCG capsule Take 1 capsule (8 mcg total) by mouth daily with breakfast.  . [DISCONTINUED] triamcinolone cream (KENALOG) 0.1 % Apply 1 application topically daily as needed. Small amount,external use.     Review of Systems  Constitution: Positive for malaise/fatigue. Negative for decreased appetite, weight gain and weight loss.  Eyes: Negative for visual disturbance.  Cardiovascular: Positive for chest pain. Negative for claudication, dyspnea on exertion, leg swelling, orthopnea, palpitations and syncope.  Respiratory: Negative for hemoptysis and wheezing.   Endocrine: Negative for cold intolerance and heat intolerance.  Hematologic/Lymphatic: Does not bruise/bleed easily.  Skin: Negative for nail changes.  Musculoskeletal: Negative for muscle weakness and myalgias.  Gastrointestinal: Positive for hemorrhoids (bleeding). Negative for abdominal pain, change in bowel habit, constipation, nausea and vomiting.  Neurological: Negative for difficulty with concentration, dizziness, focal weakness and headaches.  Psychiatric/Behavioral: Negative for altered mental status and suicidal ideas.  All other systems reviewed and are negative.      Objective:     Blood pressure (!) 153/58, pulse 60, height 5\' 5"  (1.651 m), weight 243 lb (110.2 kg).  Echocardiogram 11/24/2017: Left ventricle cavity  is normal in size. Mild concentric hypertrophy of the left ventricle. Normal global wall motion. Doppler evidence of grade II (pseudonormal) diastolic dysfunction, elevated LAP. Calculated EF 65%. Left atrial cavity is severely dilated. Mildly restricted aortic valve leaflets with trace aortic stenosis. Aortic valve mean gradient of  6 mmHg, Vmax of 1.8 m/s. Calculated aortic valve area by continuity equation is 2.1 cm. Mild (Grade I) mitral regurgitation. Mild to moderate tricuspid regurgitation. Estimated pulmonary artery systolic pressure 41-96 mmHg.  Lexiscan myoview stress test 09/06/2018:  1. Lexiscan stress test was performed. Exercise capacity was not assessed. Stress symptoms included dyspnea, dizziness. Peak effect blood pressure was 158/68 mmHg. The resting and stress electrocardiogram demonstrated normal sinus rhythm, normal resting conduction, no resting arrhythmias and normal rest repolarization.  Stress EKG is non diagnostic for ischemia as it is a pharmacologic stress.  2. The overall quality of the study is good. There is no evidence of abnormal lung activity. Stress and rest SPECT images demonstrate homogeneous tracer distribution throughout the myocardium. Gated SPECT imaging reveals normal myocardial thickening and wall motion. The left ventricular ejection fraction was normal (61%).   3. Low risk study.  Carotid artery duplex 12/02/2016: No hemodynamically significant arterial disease in the internal carotid artery bilaterally. There is mild calcific plaque bilaterally. Left carotid is mildly tortuous and may be a source of bruit.  Antegrade  vertebral artery flow.  *Physical exam not performed as this is a telephone visit*       Assessment & Recommendations:   1. Orthostatic hypotension Continues to have orthostatic hypotension despite using support stockings. Suspect secondary to peripheral neuropathy. Can use Midodrine as needed. Blood pressure is slightly elevated on  non-dialysis days; however, often drops low at dialysis. Will hold off on medication changes for now. Encouraged her to sleep elevated and continue wearing support stockings when on her feet.   2. Fatigue, unspecified type Unsure of etiology of her fatigue as she reports her iron levels have now stabilized. Possibly related to her orthostatic hypotension.   3. Musculoskeletal chest pain Her chest pain is clearly musculoskeletal as it improves with stretching. Has had low risk nuclear stress test in Feb. 2020. Use heat/ice to her chest to help with this.   4. Paroxysmal atrial fibrillation ([place]) She has  been maintaining sinus rhythm for several years. Question if her symptoms could possibly be related to amiodarone. Will have her take amiodarone every other day to see if she has any improvement in symptoms. No reported bleeding with Coumadin.   5. ESRD on hemodialysis ([place]) Overall tolerating well with occasional episodes of hypotension. Will use Midodrine as needed.    Plan: She will follow up in the office in 3 months, but encouraged sooner follow up if needed for any worsening problems.  Jeri Lager, FNP-C [facility], [place] Office: [phone] Fax: [phone]

## 2018-11-12 DIAGNOSIS — D631 Anemia in chronic kidney disease: Secondary | ICD-10-CM | POA: Diagnosis not present

## 2018-11-12 DIAGNOSIS — N2581 Secondary hyperparathyroidism of renal origin: Secondary | ICD-10-CM | POA: Diagnosis not present

## 2018-11-12 DIAGNOSIS — N186 End stage renal disease: Secondary | ICD-10-CM | POA: Diagnosis not present

## 2018-11-12 DIAGNOSIS — D509 Iron deficiency anemia, unspecified: Secondary | ICD-10-CM | POA: Diagnosis not present

## 2018-11-14 ENCOUNTER — Encounter: Payer: Self-pay | Admitting: Cardiology

## 2018-11-14 MED ORDER — AMIODARONE HCL 200 MG PO TABS: 100.0000 mg | ORAL_TABLET | ORAL | 3 refills | Status: DC

## 2018-11-14 NOTE — Addendum Note (Signed)
Addended by: Gwinda Maine on: 11/14/2018 07:47 PM   Modules accepted: Orders

## 2018-11-15 DIAGNOSIS — N186 End stage renal disease: Secondary | ICD-10-CM | POA: Diagnosis not present

## 2018-11-15 DIAGNOSIS — D631 Anemia in chronic kidney disease: Secondary | ICD-10-CM | POA: Diagnosis not present

## 2018-11-15 DIAGNOSIS — N2581 Secondary hyperparathyroidism of renal origin: Secondary | ICD-10-CM | POA: Diagnosis not present

## 2018-11-15 DIAGNOSIS — D509 Iron deficiency anemia, unspecified: Secondary | ICD-10-CM | POA: Diagnosis not present

## 2018-11-17 DIAGNOSIS — D689 Coagulation defect, unspecified: Secondary | ICD-10-CM | POA: Diagnosis not present

## 2018-11-17 DIAGNOSIS — N186 End stage renal disease: Secondary | ICD-10-CM | POA: Diagnosis not present

## 2018-11-17 DIAGNOSIS — N2581 Secondary hyperparathyroidism of renal origin: Secondary | ICD-10-CM | POA: Diagnosis not present

## 2018-11-17 DIAGNOSIS — D509 Iron deficiency anemia, unspecified: Secondary | ICD-10-CM | POA: Diagnosis not present

## 2018-11-17 DIAGNOSIS — D631 Anemia in chronic kidney disease: Secondary | ICD-10-CM | POA: Diagnosis not present

## 2018-11-17 NOTE — Progress Notes (Signed)
No show

## 2018-11-19 ENCOUNTER — Other Ambulatory Visit: Payer: Self-pay

## 2018-11-19 ENCOUNTER — Telehealth: Payer: Self-pay

## 2018-11-19 DIAGNOSIS — I158 Other secondary hypertension: Secondary | ICD-10-CM | POA: Diagnosis not present

## 2018-11-19 DIAGNOSIS — N2581 Secondary hyperparathyroidism of renal origin: Secondary | ICD-10-CM | POA: Diagnosis not present

## 2018-11-19 DIAGNOSIS — Z992 Dependence on renal dialysis: Secondary | ICD-10-CM | POA: Diagnosis not present

## 2018-11-19 DIAGNOSIS — I480 Paroxysmal atrial fibrillation: Secondary | ICD-10-CM

## 2018-11-19 DIAGNOSIS — N186 End stage renal disease: Secondary | ICD-10-CM | POA: Diagnosis not present

## 2018-11-19 NOTE — Telephone Encounter (Signed)
Pt called stating that her INR was 3.9 wed at dialysis - She is taking 7.5 mg T,Th; 5 mg all others ; Please advise ; her next INR check is 11/30/2018

## 2018-11-19 NOTE — Telephone Encounter (Signed)
I am unable to react to her INR done at the kidney center. She has to come here for management.  She was stable on this dose last 2 months at least. We will address this on her visit on 11/30/18, but for now hold one dose of 7.5 mg once only this Tuesday, but resume previous dose 7.5 mg (7.5 mg x 1) every Tue, Thu; 5 mg (5 mg x 1) all other days

## 2018-11-22 DIAGNOSIS — N186 End stage renal disease: Secondary | ICD-10-CM | POA: Diagnosis not present

## 2018-11-22 DIAGNOSIS — N2581 Secondary hyperparathyroidism of renal origin: Secondary | ICD-10-CM | POA: Diagnosis not present

## 2018-11-22 NOTE — Telephone Encounter (Signed)
LMOM with details

## 2018-11-24 DIAGNOSIS — N186 End stage renal disease: Secondary | ICD-10-CM | POA: Diagnosis not present

## 2018-11-24 DIAGNOSIS — N2581 Secondary hyperparathyroidism of renal origin: Secondary | ICD-10-CM | POA: Diagnosis not present

## 2018-11-26 DIAGNOSIS — N2581 Secondary hyperparathyroidism of renal origin: Secondary | ICD-10-CM | POA: Diagnosis not present

## 2018-11-26 DIAGNOSIS — N186 End stage renal disease: Secondary | ICD-10-CM | POA: Diagnosis not present

## 2018-11-29 DIAGNOSIS — N2581 Secondary hyperparathyroidism of renal origin: Secondary | ICD-10-CM | POA: Diagnosis not present

## 2018-11-29 DIAGNOSIS — N186 End stage renal disease: Secondary | ICD-10-CM | POA: Diagnosis not present

## 2018-11-30 ENCOUNTER — Other Ambulatory Visit: Payer: Self-pay

## 2018-11-30 ENCOUNTER — Ambulatory Visit (INDEPENDENT_AMBULATORY_CARE_PROVIDER_SITE_OTHER): Payer: Medicare Other | Admitting: Cardiology

## 2018-11-30 DIAGNOSIS — I480 Paroxysmal atrial fibrillation: Secondary | ICD-10-CM

## 2018-11-30 LAB — POCT INR: INR: 2.8 (ref 2.0–3.0)

## 2018-11-30 NOTE — Progress Notes (Signed)
INR within range today and she is tolerating well without any bleeding. Will continue with current regimen. Repeat INR in 1 month.

## 2018-12-01 DIAGNOSIS — D689 Coagulation defect, unspecified: Secondary | ICD-10-CM | POA: Diagnosis not present

## 2018-12-01 DIAGNOSIS — N2581 Secondary hyperparathyroidism of renal origin: Secondary | ICD-10-CM | POA: Diagnosis not present

## 2018-12-01 DIAGNOSIS — N186 End stage renal disease: Secondary | ICD-10-CM | POA: Diagnosis not present

## 2018-12-03 DIAGNOSIS — N186 End stage renal disease: Secondary | ICD-10-CM | POA: Diagnosis not present

## 2018-12-03 DIAGNOSIS — N2581 Secondary hyperparathyroidism of renal origin: Secondary | ICD-10-CM | POA: Diagnosis not present

## 2018-12-06 DIAGNOSIS — N186 End stage renal disease: Secondary | ICD-10-CM | POA: Diagnosis not present

## 2018-12-06 DIAGNOSIS — N2581 Secondary hyperparathyroidism of renal origin: Secondary | ICD-10-CM | POA: Diagnosis not present

## 2018-12-08 DIAGNOSIS — N186 End stage renal disease: Secondary | ICD-10-CM | POA: Diagnosis not present

## 2018-12-08 DIAGNOSIS — N2581 Secondary hyperparathyroidism of renal origin: Secondary | ICD-10-CM | POA: Diagnosis not present

## 2018-12-10 DIAGNOSIS — N2581 Secondary hyperparathyroidism of renal origin: Secondary | ICD-10-CM | POA: Diagnosis not present

## 2018-12-10 DIAGNOSIS — N186 End stage renal disease: Secondary | ICD-10-CM | POA: Diagnosis not present

## 2018-12-13 DIAGNOSIS — N2581 Secondary hyperparathyroidism of renal origin: Secondary | ICD-10-CM | POA: Diagnosis not present

## 2018-12-13 DIAGNOSIS — D689 Coagulation defect, unspecified: Secondary | ICD-10-CM | POA: Diagnosis not present

## 2018-12-13 DIAGNOSIS — N186 End stage renal disease: Secondary | ICD-10-CM | POA: Diagnosis not present

## 2018-12-15 DIAGNOSIS — N2581 Secondary hyperparathyroidism of renal origin: Secondary | ICD-10-CM | POA: Diagnosis not present

## 2018-12-15 DIAGNOSIS — N186 End stage renal disease: Secondary | ICD-10-CM | POA: Diagnosis not present

## 2018-12-17 DIAGNOSIS — N186 End stage renal disease: Secondary | ICD-10-CM | POA: Diagnosis not present

## 2018-12-17 DIAGNOSIS — N2581 Secondary hyperparathyroidism of renal origin: Secondary | ICD-10-CM | POA: Diagnosis not present

## 2018-12-20 DIAGNOSIS — N2581 Secondary hyperparathyroidism of renal origin: Secondary | ICD-10-CM | POA: Diagnosis not present

## 2018-12-20 DIAGNOSIS — Z992 Dependence on renal dialysis: Secondary | ICD-10-CM | POA: Diagnosis not present

## 2018-12-20 DIAGNOSIS — I158 Other secondary hypertension: Secondary | ICD-10-CM | POA: Diagnosis not present

## 2018-12-20 DIAGNOSIS — N186 End stage renal disease: Secondary | ICD-10-CM | POA: Diagnosis not present

## 2018-12-21 ENCOUNTER — Other Ambulatory Visit: Payer: Self-pay | Admitting: Family Medicine

## 2018-12-21 DIAGNOSIS — I480 Paroxysmal atrial fibrillation: Secondary | ICD-10-CM

## 2018-12-22 DIAGNOSIS — N2581 Secondary hyperparathyroidism of renal origin: Secondary | ICD-10-CM | POA: Diagnosis not present

## 2018-12-22 DIAGNOSIS — N186 End stage renal disease: Secondary | ICD-10-CM | POA: Diagnosis not present

## 2018-12-24 DIAGNOSIS — N2581 Secondary hyperparathyroidism of renal origin: Secondary | ICD-10-CM | POA: Diagnosis not present

## 2018-12-24 DIAGNOSIS — N186 End stage renal disease: Secondary | ICD-10-CM | POA: Diagnosis not present

## 2018-12-27 DIAGNOSIS — D689 Coagulation defect, unspecified: Secondary | ICD-10-CM | POA: Diagnosis not present

## 2018-12-27 DIAGNOSIS — N186 End stage renal disease: Secondary | ICD-10-CM | POA: Diagnosis not present

## 2018-12-27 DIAGNOSIS — N2581 Secondary hyperparathyroidism of renal origin: Secondary | ICD-10-CM | POA: Diagnosis not present

## 2018-12-28 ENCOUNTER — Other Ambulatory Visit: Payer: Self-pay

## 2018-12-28 ENCOUNTER — Ambulatory Visit (INDEPENDENT_AMBULATORY_CARE_PROVIDER_SITE_OTHER): Payer: Medicare Other | Admitting: Cardiology

## 2018-12-28 DIAGNOSIS — Z7901 Long term (current) use of anticoagulants: Secondary | ICD-10-CM | POA: Diagnosis not present

## 2018-12-28 DIAGNOSIS — Z5181 Encounter for therapeutic drug level monitoring: Secondary | ICD-10-CM

## 2018-12-28 DIAGNOSIS — I480 Paroxysmal atrial fibrillation: Secondary | ICD-10-CM | POA: Diagnosis not present

## 2018-12-28 LAB — POCT INR: INR: 2.3 (ref 2.0–3.0)

## 2018-12-29 DIAGNOSIS — N186 End stage renal disease: Secondary | ICD-10-CM | POA: Diagnosis not present

## 2018-12-29 DIAGNOSIS — N2581 Secondary hyperparathyroidism of renal origin: Secondary | ICD-10-CM | POA: Diagnosis not present

## 2018-12-30 NOTE — Progress Notes (Signed)
INR within range, continue with current regimen. No bleeding reported. Will repeat INR in 4 weeks.

## 2018-12-31 ENCOUNTER — Telehealth: Payer: Self-pay

## 2018-12-31 ENCOUNTER — Other Ambulatory Visit: Payer: Self-pay

## 2018-12-31 DIAGNOSIS — N2581 Secondary hyperparathyroidism of renal origin: Secondary | ICD-10-CM | POA: Diagnosis not present

## 2018-12-31 DIAGNOSIS — N186 End stage renal disease: Secondary | ICD-10-CM | POA: Diagnosis not present

## 2018-12-31 NOTE — Telephone Encounter (Signed)
Patient called and said that hse is having issues with her BP being to low. She says it gets as low as 70/30. It does get high when she is at dialysis. She asked for a telephone visit with you sooner than her appt at the end of the month.   I added her for a telephone visit for you for 01/03/19.  I advised her to have all her meds available and also her BP cuff.

## 2019-01-03 ENCOUNTER — Encounter: Payer: Self-pay | Admitting: Cardiology

## 2019-01-03 ENCOUNTER — Ambulatory Visit (INDEPENDENT_AMBULATORY_CARE_PROVIDER_SITE_OTHER): Payer: Medicare Other | Admitting: Cardiology

## 2019-01-03 ENCOUNTER — Other Ambulatory Visit: Payer: Self-pay

## 2019-01-03 VITALS — BP 120/60 | Ht 65.0 in | Wt 235.0 lb

## 2019-01-03 DIAGNOSIS — I951 Orthostatic hypotension: Secondary | ICD-10-CM | POA: Diagnosis not present

## 2019-01-03 DIAGNOSIS — D509 Iron deficiency anemia, unspecified: Secondary | ICD-10-CM

## 2019-01-03 DIAGNOSIS — Z992 Dependence on renal dialysis: Secondary | ICD-10-CM

## 2019-01-03 DIAGNOSIS — I480 Paroxysmal atrial fibrillation: Secondary | ICD-10-CM | POA: Diagnosis not present

## 2019-01-03 DIAGNOSIS — N186 End stage renal disease: Secondary | ICD-10-CM | POA: Diagnosis not present

## 2019-01-03 DIAGNOSIS — N2581 Secondary hyperparathyroidism of renal origin: Secondary | ICD-10-CM | POA: Diagnosis not present

## 2019-01-03 MED ORDER — FLUDROCORTISONE ACETATE 0.1 MG PO TABS: 0.1000 mg | ORAL_TABLET | ORAL | 0 refills | Status: DC

## 2019-01-03 NOTE — Progress Notes (Signed)
Primary Physician:  [name], [name], MD   [name], female    DOB: [birth year], 73 y.o.   MRN: [id number]  Subjective:    Chief Complaint  Patient presents with  . Low BP    low bp after dialysis     HPI: Jane [name]  is a 73 y.o. female  with paroxysmal atrial fibrillation, diagnosed sometime in 1999, has had cardioversion in 2010 while she was in [place] and has been on amiodarone since then. Amiodarone recently decreased due to QT prolongation. She has end-stage renal disease and is presently on hemodialysis on M-W-F. Echocardiogram in April 2019 revealed grade 2 diastolic dysfucntion, trace aortic stenosis, and elevated pulmonary pressures.   Lexiscan nuclear stress test on 09/06/2018 was performed that was considered low risk study.     Patient was last seen 6 weeks ago, recently called our office requesting acute visit for hypertension.  Patient over the last few months has been having orthostatic hypotension and was recently put on midodrine by her nephrologist.  She reports she is now had to increase her dose to 10 mg 2-3 times per day of dialysis to help with this.  She reports her blood pressure will drop during dialysis and also after she gets home.  Has had systolic readings in the 35K to 90s.  Has associated dizziness, nausea, and fatigue.  She reports she is scared to stay by herself because of this.  She has been wearing support stockings and sleeps elevated.  On her days that she does not go to dialysis, generally her blood pressure is stable; however, occasionally her blood pressure does spike to 562-563 systolic and she has visual changes with this and headache.  She has had to take occasional dose of Coreg to help with this.   Has history of anemia and has undergone iron transfusions and states her levels are now normal.  Also has hemorrhoids that occasionally bleed and is on linzess for management.  INR is managed by [place].  Past Medical History:  Diagnosis  Date  . Acid reflux   . Atrial fibrillation ([place])   . Bilateral carotid bruits   . Hypertension   . Malaise and fatigue   . Orthostatic hypotension   . Renal disorder   . Shortness of breath   . Sleep apnea   . Syncope     Past Surgical History:  Procedure Laterality Date  . BACK SURGERY    . colon removal  06/2016  . IR GENERIC HISTORICAL  07/09/2016   IR US GUIDE VASC ACCESS RIGHT 07/09/2016 Arne Cleveland, MD [facility]  . IR GENERIC HISTORICAL  07/09/2016   IR FLUORO GUIDE CV LINE RIGHT 07/09/2016 Arne Cleveland, MD [facility]  . KNEE ARTHROPLASTY    . LAPAROSCOPIC SIGMOID COLECTOMY N/A 07/11/2016   Procedure: LAPAROSCOPIC SIGMOID COLECTOMY;  Surgeon: Clovis Riley, MD;  Location: [facility];  Service: General;  Laterality: N/A;  . TUBAL LIGATION      Social History   Socioeconomic History  . Marital status: Divorced    Spouse name: Not on file  . Number of children: 4  . Years of education: Not on file  . Highest education level: Not on file  Occupational History  . Not on file  Social Needs  . Financial resource strain: Not on file  . Food insecurity    Worry: Not on file    Inability: Not on file  . Transportation needs    Medical:  Not on file    Non-medical: Not on file  Tobacco Use  . Smoking status: Never Smoker  . Smokeless tobacco: Never Used  Substance and Sexual Activity  . Alcohol use: No  . Drug use: No  . Sexual activity: Never  Lifestyle  . Physical activity    Days per week: Not on file    Minutes per session: Not on file  . Stress: Not on file  Relationships  . Social Herbalist on phone: Not on file    Gets together: Not on file    Attends religious service: Not on file    Active member of club or organization: Not on file    Attends meetings of clubs or organizations: Not on file    Relationship status: Not on file  . Intimate partner violence    Fear of current or ex partner: Not on file    Emotionally abused: Not  on file    Physically abused: Not on file    Forced sexual activity: Not on file  Other Topics Concern  . Not on file  Social History Narrative  . Not on file    Review of Systems  Constitution: Positive for malaise/fatigue. Negative for decreased appetite, weight gain and weight loss.  Eyes: Negative for visual disturbance.  Cardiovascular: Negative for chest pain, claudication, dyspnea on exertion, leg swelling, orthopnea, palpitations and syncope.  Respiratory: Negative for hemoptysis and wheezing.   Endocrine: Negative for cold intolerance and heat intolerance.  Hematologic/Lymphatic: Does not bruise/bleed easily.  Skin: Negative for nail changes.  Musculoskeletal: Negative for muscle weakness and myalgias.  Gastrointestinal: Positive for hemorrhoids (bleeding). Negative for abdominal pain, change in bowel habit, constipation, nausea and vomiting.  Neurological: Positive for dizziness. Negative for difficulty with concentration, focal weakness and headaches.  Psychiatric/Behavioral: Negative for altered mental status and suicidal ideas.  All other systems reviewed and are negative.     Objective:  Blood pressure 120/60, height 5\' 5"  (1.651 m), weight 235 lb (106.6 kg). Body mass index is 39.11 kg/m.    Physical exam not performed or limited due to virtual visit.  Patient appeared to be in no distress, Neck was supple, respiration was not labored.  Please see exam details from prior visit is as below.   Physical Exam  Constitutional: She is oriented to person, place, and time. Vital signs are normal. She appears well-developed and well-nourished.  HENT:  Head: Normocephalic and atraumatic.  Neck: Normal range of motion.  Cardiovascular: Normal rate, regular rhythm, normal heart sounds and intact distal pulses.  Pulses:      Carotid pulses are on the right side with bruit and on the left side with bruit. Right AV fistula/graft  Pulmonary/Chest: Effort normal and breath  sounds normal. No accessory muscle usage. No respiratory distress.  Abdominal: Soft. Bowel sounds are normal.  Musculoskeletal: Normal range of motion.  Neurological: She is alert and oriented to person, place, and time.  Skin: Skin is warm and dry.  Vitals reviewed.  Radiology: No results found.  Laboratory examination:    CMP Latest Ref Rng & Units 08/04/2016 07/18/2016 07/16/2016  Glucose 65 - 99 mg/dL 88 91 82  BUN 6 - 20 mg/dL 28(H) 17 35(H)  Creatinine 0.44 - 1.00 mg/dL 6.44(H) 7.59(H) 8.75(H)  Sodium 135 - 145 mmol/L 139 133(L) 134(L)  Potassium 3.5 - 5.1 mmol/L 4.9 3.2(L) 3.7  Chloride 101 - 111 mmol/L 104 98(L) 98(L)  CO2 22 - 32 mmol/L 22  23 25  Calcium 8.9 - 10.3 mg/dL 8.8(L) 7.7(L) 7.9(L)  Total Protein 6.5 - 8.1 g/dL 7.1 - -  Total Bilirubin 0.3 - 1.2 mg/dL 0.5 - -  Alkaline Phos 38 - 126 U/L 104 - -  AST 15 - 41 U/L 18 - -  ALT 14 - 54 U/L 11(L) - -   CBC Latest Ref Rng & Units 09/22/2018 01/26/2018 08/04/2016  WBC 4.0 - 10.5 K/uL 5.5 4.4 5.8  Hemoglobin 12.0 - 15.0 g/dL 10.7(L) 10.9(L) 10.1(L)  Hematocrit 36.0 - 46.0 % 33.0(L) 32.9(L) 32.2(L)  Platelets 150.0 - 400.0 K/uL 234.0 149.0(L) 154   Lipid Panel     Component Value Date/Time   CHOL 130 06/27/2016 0433   TRIG 42 06/27/2016 0433   HDL 49 06/27/2016 0433   CHOLHDL 2.7 06/27/2016 0433   VLDL 8 06/27/2016 0433   LDLCALC 73 06/27/2016 0433   HEMOGLOBIN A1C No results found for: HGBA1C, MPG TSH Recent Labs    09/22/18 1032  TSH 0.94    PRN Meds:. Medications Discontinued During This Encounter  Medication Reason  . carvedilol (COREG) 25 MG tablet Discontinued by provider   Current Meds  Medication Sig  . albuterol (PROVENTIL HFA;VENTOLIN HFA) 108 (90 Base) MCG/ACT inhaler Inhale 1 puff into the lungs every 6 (six) hours as needed for wheezing or shortness of breath.  Marland Kitchen amiodarone (PACERONE) 200 MG tablet Take 0.5 tablets (100 mg total) by mouth every other day.  . b complex vitamins capsule Take  by mouth.  . cinacalcet (SENSIPAR) 60 MG tablet Take 60 mg by mouth every other day.  . ethyl chloride spray USE 1 TO 2 SPRAYS TO DIALYSIS ACCESS EVERY TREATMENT  . linaclotide (LINZESS) 145 MCG CAPS capsule Take 1 capsule (145 mcg total) by mouth daily before breakfast.  . midodrine (PROAMATINE) 5 MG tablet Take 2 tablets by mouth daily.  . multivitamin (RENA-VIT) TABS tablet Take 1 tablet by mouth daily.  . sevelamer carbonate (RENVELA) 800 MG tablet Take 800-1,600 mg by mouth See admin instructions. 2 tabs 3 times daily  . warfarin (COUMADIN) 5 MG tablet Take 1 tablet (5 mg total) by mouth daily at 6 PM. 7.5mg  T,  Thur; 5mg  all other days    Cardiac Studies:   Echocardiogram 11/24/2017: Left ventricle cavity is normal in size. Mild concentric hypertrophy of the left ventricle. Normal global wall motion. Doppler evidence of grade II (pseudonormal) diastolic dysfunction, elevated LAP. Calculated EF 65%. Left atrial cavity is severely dilated. Mildly restricted aortic valve leaflets with trace aortic stenosis. Aortic valve mean gradient of 6 mmHg, Vmax of 1.8 m/s. Calculated aortic valve area by continuity equation is 2.1 cm. Mild (Grade I) mitral regurgitation. Mild to moderate tricuspid regurgitation. Estimated pulmonary artery systolic pressure 95-18 mmHg.  Lexiscan myoview stress test 09/06/2018:  1. Lexiscan stress test was performed. Exercise capacity was not assessed. Stress symptoms included dyspnea, dizziness. Peak effect blood pressure was 158/68 mmHg. The resting and stress electrocardiogram demonstrated normal sinus rhythm, normal resting conduction, no resting arrhythmias and normal rest repolarization. Stress EKG is non diagnostic for ischemia as it is a pharmacologic stress.  2. The overall quality of the study is good. There is no evidence of abnormal lung activity. Stress and rest SPECT images demonstrate homogeneous tracer distribution throughout the myocardium. Gated SPECT  imaging reveals normal myocardial thickening and wall motion. The left ventricular ejection fraction was normal (61%).  3. Low risk study.  Carotid artery duplex 12/02/2016: No hemodynamically significant  arterial disease in the internal carotid artery bilaterally. There is mild calcific plaque bilaterally. Left carotid is mildly tortuous and may be a source of bruit.  Antegrade vertebral artery flow.  Assessment:     ICD-10-CM   1. Orthostatic hypotension  I95.1 fludrocortisone (FLORINEF) 0.1 MG tablet  2. ESRD on hemodialysis (HCC)  N18.6    Z99.2   3. Iron deficiency anemia, unspecified iron deficiency anemia type  D50.9   4. Paroxysmal atrial fibrillation (HCC)  I48.0       Recommendations:   Patient made acute visit today for continued episodes of hypotension.  I suspect orthostatic hypotension is secondary to peripheral neuropathy.  I discussed with the patient that given her chronic kidney disease, this is common, but also unfortunately difficult to treat.  She has been using Midodrine as needed and continues to have episodes where her blood pressure will drop in the 79Y to 80X systolic.  We will try fludrocortisone 0.1 mg every other day.  She can continue to use midodrine as needed.  Advised her that if she has elevated blood pressure, may take half a tablet of Coreg as needed.  Advised her to continue with daily support stockings and sleeping elevated.  She will also continue to work with nephrology for management of her dialysis.  She reports anemia has been stable and has not had recent iron transfusion.    She has not had any further episodes of chest pain since last seen by me.  Amiodarone was decreased to every other day at her last office visit.  She has paroxysmal atrial fibrillation and has been maintaining sinus rhythm for the last several years.  I would like to see her back in 10 days for follow-up on orthostatic hypotension and will check her INR at that time.   *I  have discussed this case with Dr. Einar Gip and he participated in formulating the plan.*   Miquel Dunn, MSN, APRN, FNP-C [facility]. [place] Office: [phone] Fax: [phone]

## 2019-01-05 DIAGNOSIS — N2581 Secondary hyperparathyroidism of renal origin: Secondary | ICD-10-CM | POA: Diagnosis not present

## 2019-01-05 DIAGNOSIS — N186 End stage renal disease: Secondary | ICD-10-CM | POA: Diagnosis not present

## 2019-01-07 DIAGNOSIS — N2581 Secondary hyperparathyroidism of renal origin: Secondary | ICD-10-CM | POA: Diagnosis not present

## 2019-01-07 DIAGNOSIS — N186 End stage renal disease: Secondary | ICD-10-CM | POA: Diagnosis not present

## 2019-01-10 DIAGNOSIS — N2581 Secondary hyperparathyroidism of renal origin: Secondary | ICD-10-CM | POA: Diagnosis not present

## 2019-01-10 DIAGNOSIS — N186 End stage renal disease: Secondary | ICD-10-CM | POA: Diagnosis not present

## 2019-01-11 ENCOUNTER — Ambulatory Visit (INDEPENDENT_AMBULATORY_CARE_PROVIDER_SITE_OTHER): Payer: Medicare Other | Admitting: Cardiology

## 2019-01-11 ENCOUNTER — Encounter: Payer: Self-pay | Admitting: Cardiology

## 2019-01-11 ENCOUNTER — Other Ambulatory Visit: Payer: Self-pay

## 2019-01-11 VITALS — Ht 65.0 in | Wt 236.0 lb

## 2019-01-11 DIAGNOSIS — N186 End stage renal disease: Secondary | ICD-10-CM | POA: Diagnosis not present

## 2019-01-11 DIAGNOSIS — I480 Paroxysmal atrial fibrillation: Secondary | ICD-10-CM

## 2019-01-11 DIAGNOSIS — Z992 Dependence on renal dialysis: Secondary | ICD-10-CM | POA: Diagnosis not present

## 2019-01-11 DIAGNOSIS — I10 Essential (primary) hypertension: Secondary | ICD-10-CM | POA: Diagnosis not present

## 2019-01-11 DIAGNOSIS — I951 Orthostatic hypotension: Secondary | ICD-10-CM | POA: Diagnosis not present

## 2019-01-11 NOTE — Progress Notes (Signed)
Primary Physician:  [name], [name], MD   [name], female    DOB: 12-21-1945, 73 y.o.   MRN: [id number]  Subjective:    Chief Complaint  Patient presents with  . Atrial Fibrillation  . Hypotension  . Follow-up    HPI: Jane [name]  is a 73 y.o. female  with paroxysmal atrial fibrillation, diagnosed sometime in 1999, has had cardioversion in 2010 while she was in [place] and has been on amiodarone since then. Amiodarone recently decreased due to QT prolongation. She has end-stage renal disease and is presently on hemodialysis on M-W-F. Echocardiogram in April 2019 revealed grade 2 diastolic dysfucntion, trace aortic stenosis, and elevated pulmonary pressures.   Lexiscan nuclear stress test on 09/06/2018 was performed that was considered low risk study.     Patient over the last few months has been having orthostatic hypotension, particularly on days after dialysis. Due to having to increase midodrine, she was started on fludrocortisone every other day. She also occasionally has elevated blood pressure, and takes a half a tablet of coreg as needed. Since being on fludrocortisone, she has noticed some improvement and states that she is feeling better than she has in the last 4 months.   Amiodarone has also been decreased to every other day due to fatigue and states that this is helping. She does not sleep well at night and does continue to have some fatigue. She reports that she was found to have sleep apnea while living in [place], but did not tolerate CPAP. She has not had any evaluation since living in [place].  Has history of anemia and has undergone iron transfusions and states her levels are now normal.  Also has hemorrhoids that occasionally bleed and is on linzess for management.  INR is managed by [place].  Past Medical History:  Diagnosis Date  . Acid reflux   . Atrial fibrillation ([place])   . Bilateral carotid bruits   . Hypertension   . Malaise and fatigue    . Orthostatic hypotension   . Renal disorder   . Shortness of breath   . Sleep apnea   . Syncope     Past Surgical History:  Procedure Laterality Date  . BACK SURGERY    . colon removal  06/2016  . IR GENERIC HISTORICAL  07/09/2016   IR US GUIDE VASC ACCESS RIGHT 07/09/2016 Arne Cleveland, MD [facility]  . IR GENERIC HISTORICAL  07/09/2016   IR FLUORO GUIDE CV LINE RIGHT 07/09/2016 Arne Cleveland, MD [facility]  . KNEE ARTHROPLASTY    . LAPAROSCOPIC SIGMOID COLECTOMY N/A 07/11/2016   Procedure: LAPAROSCOPIC SIGMOID COLECTOMY;  Surgeon: Clovis Riley, MD;  Location: [facility];  Service: General;  Laterality: N/A;  . TUBAL LIGATION      Social History   Socioeconomic History  . Marital status: Divorced    Spouse name: Not on file  . Number of children: 4  . Years of education: Not on file  . Highest education level: Not on file  Occupational History  . Not on file  Social Needs  . Financial resource strain: Not on file  . Food insecurity    Worry: Not on file    Inability: Not on file  . Transportation needs    Medical: Not on file    Non-medical: Not on file  Tobacco Use  . Smoking status: Never Smoker  . Smokeless tobacco: Never Used  Substance and Sexual Activity  . Alcohol use: No  .  Drug use: No  . Sexual activity: Never  Lifestyle  . Physical activity    Days per week: Not on file    Minutes per session: Not on file  . Stress: Not on file  Relationships  . Social Herbalist on phone: Not on file    Gets together: Not on file    Attends religious service: Not on file    Active member of club or organization: Not on file    Attends meetings of clubs or organizations: Not on file    Relationship status: Not on file  . Intimate partner violence    Fear of current or ex partner: Not on file    Emotionally abused: Not on file    Physically abused: Not on file    Forced sexual activity: Not on file  Other Topics Concern  . Not on file   Social History Narrative  . Not on file    Review of Systems  Constitution: Positive for malaise/fatigue. Negative for decreased appetite, weight gain and weight loss.  Eyes: Negative for visual disturbance.  Cardiovascular: Negative for chest pain, claudication, dyspnea on exertion, leg swelling, orthopnea, palpitations and syncope.  Respiratory: Negative for hemoptysis and wheezing.   Endocrine: Negative for cold intolerance and heat intolerance.  Hematologic/Lymphatic: Does not bruise/bleed easily.  Skin: Negative for nail changes.  Musculoskeletal: Negative for muscle weakness and myalgias.  Gastrointestinal: Positive for hemorrhoids (bleeding). Negative for abdominal pain, change in bowel habit, constipation, nausea and vomiting.  Neurological: Positive for dizziness. Negative for difficulty with concentration, focal weakness and headaches.  Psychiatric/Behavioral: Negative for altered mental status and suicidal ideas.  All other systems reviewed and are negative.     Objective:  Height 5\' 5"  (1.651 m), weight 236 lb (107 kg), SpO2 97 %. Body mass index is 39.27 kg/m.  Supine BP: 171/72 HR 72 Sitting BP: 168/78 HR 70 Standing BP: 153/75 HR 73    Physical Exam  Constitutional: She is oriented to person, place, and time. Vital signs are normal. She appears well-developed and well-nourished.  HENT:  Head: Normocephalic and atraumatic.  Neck: Normal range of motion.  Cardiovascular: Normal rate, regular rhythm, normal heart sounds and intact distal pulses.  Pulses:      Carotid pulses are on the right side with bruit and on the left side with bruit. Right AV fistula/graft  Pulmonary/Chest: Effort normal and breath sounds normal. No accessory muscle usage. No respiratory distress.  Abdominal: Soft. Bowel sounds are normal.  Musculoskeletal: Normal range of motion.  Neurological: She is alert and oriented to person, place, and time.  Skin: Skin is warm and dry.  Vitals  reviewed.  Radiology: No results found.  Laboratory examination:    CMP Latest Ref Rng & Units 08/04/2016 07/18/2016 07/16/2016  Glucose 65 - 99 mg/dL 88 91 82  BUN 6 - 20 mg/dL 28(H) 17 35(H)  Creatinine 0.44 - 1.00 mg/dL 6.44(H) 7.59(H) 8.75(H)  Sodium 135 - 145 mmol/L 139 133(L) 134(L)  Potassium 3.5 - 5.1 mmol/L 4.9 3.2(L) 3.7  Chloride 101 - 111 mmol/L 104 98(L) 98(L)  CO2 22 - 32 mmol/L 22 23 25   Calcium 8.9 - 10.3 mg/dL 8.8(L) 7.7(L) 7.9(L)  Total Protein 6.5 - 8.1 g/dL 7.1 - -  Total Bilirubin 0.3 - 1.2 mg/dL 0.5 - -  Alkaline Phos 38 - 126 U/L 104 - -  AST 15 - 41 U/L 18 - -  ALT 14 - 54 U/L 11(L) - -  CBC Latest Ref Rng & Units 09/22/2018 01/26/2018 08/04/2016  WBC 4.0 - 10.5 K/uL 5.5 4.4 5.8  Hemoglobin 12.0 - 15.0 g/dL 10.7(L) 10.9(L) 10.1(L)  Hematocrit 36.0 - 46.0 % 33.0(L) 32.9(L) 32.2(L)  Platelets 150.0 - 400.0 K/uL 234.0 149.0(L) 154   Lipid Panel     Component Value Date/Time   CHOL 130 06/27/2016 0433   TRIG 42 06/27/2016 0433   HDL 49 06/27/2016 0433   CHOLHDL 2.7 06/27/2016 0433   VLDL 8 06/27/2016 0433   LDLCALC 73 06/27/2016 0433   HEMOGLOBIN A1C No results found for: HGBA1C, MPG TSH Recent Labs    09/22/18 1032  TSH 0.94    PRN Meds:. There are no discontinued medications. Current Meds  Medication Sig  . albuterol (PROVENTIL HFA;VENTOLIN HFA) 108 (90 Base) MCG/ACT inhaler Inhale 1 puff into the lungs every 6 (six) hours as needed for wheezing or shortness of breath.  . Amino Acids-Protein Hydrolys (FEEDING SUPPLEMENT, PRO-STAT SUGAR FREE 64,) LIQD Take 30 mLs by mouth 2 (two) times daily.  Marland Kitchen amiodarone (PACERONE) 200 MG tablet Take 0.5 tablets (100 mg total) by mouth every other day.  . b complex vitamins capsule Take by mouth.  . cinacalcet (SENSIPAR) 60 MG tablet Take 60 mg by mouth every other day.  . ethyl chloride spray USE 1 TO 2 SPRAYS TO DIALYSIS ACCESS EVERY TREATMENT  . fludrocortisone (FLORINEF) 0.1 MG tablet Take 1 tablet (0.1  mg total) by mouth every other day.  . linaclotide (LINZESS) 145 MCG CAPS capsule Take 1 capsule (145 mcg total) by mouth daily before breakfast.  . midodrine (PROAMATINE) 10 MG tablet Take 10 mg by mouth daily. Can take up to three a day  . multivitamin (RENA-VIT) TABS tablet Take 1 tablet by mouth daily.  . NON FORMULARY   . omeprazole (PRILOSEC) 20 MG capsule Take 20 mg by mouth daily.  . sevelamer carbonate (RENVELA) 800 MG tablet Take 800-1,600 mg by mouth See admin instructions. 2 tabs 3 times daily  . warfarin (COUMADIN) 5 MG tablet Take 1 tablet (5 mg total) by mouth daily at 6 PM. 7.5mg  T,  Thur; 5mg  all other days    Cardiac Studies:   Echocardiogram 11/24/2017: Left ventricle cavity is normal in size. Mild concentric hypertrophy of the left ventricle. Normal global wall motion. Doppler evidence of grade II (pseudonormal) diastolic dysfunction, elevated LAP. Calculated EF 65%. Left atrial cavity is severely dilated. Mildly restricted aortic valve leaflets with trace aortic stenosis. Aortic valve mean gradient of 6 mmHg, Vmax of 1.8 m/s. Calculated aortic valve area by continuity equation is 2.1 cm. Mild (Grade I) mitral regurgitation. Mild to moderate tricuspid regurgitation. Estimated pulmonary artery systolic pressure 57-32 mmHg.  Lexiscan myoview stress test 09/06/2018:  1. Lexiscan stress test was performed. Exercise capacity was not assessed. Stress symptoms included dyspnea, dizziness. Peak effect blood pressure was 158/68 mmHg. The resting and stress electrocardiogram demonstrated normal sinus rhythm, normal resting conduction, no resting arrhythmias and normal rest repolarization. Stress EKG is non diagnostic for ischemia as it is a pharmacologic stress.  2. The overall quality of the study is good. There is no evidence of abnormal lung activity. Stress and rest SPECT images demonstrate homogeneous tracer distribution throughout the myocardium. Gated SPECT imaging reveals  normal myocardial thickening and wall motion. The left ventricular ejection fraction was normal (61%).  3. Low risk study.  Carotid artery duplex 12/02/2016: No hemodynamically significant arterial disease in the internal carotid artery bilaterally. There is mild calcific  plaque bilaterally. Left carotid is mildly tortuous and may be a source of bruit.  Antegrade vertebral artery flow.  Assessment:     ICD-10-CM   1. Orthostatic hypotension  I95.1   2. Supine hypertension  I10   3. Paroxysmal atrial fibrillation (HCC)  I48.0   4. ESRD on hemodialysis ([place])  N18.6    Z99.2       Recommendations:   Patient was last seen virtually 10 days ago and started on fludrocortisone, since being on fludrocortisone she is feeling better than she has in the last 4 months.  She continues to use Midodrine as needed.  We will continue with every other day fludrocortisone.  Can also use as needed Coreg for systolic blood pressures greater than 170, but have advised her to check orthostatic blood pressures and was educated on how to do this.  Patient has supine hypertension, encouraged her to continue to sleep elevated.  Advised her to wear support stockings except for when laying down or sitting with feet elevated. She is now tolerating dialysis better as well.  I discussed orthostatic hypotension with the patient and her daughter and advised that this is difficult to treat and may not completely be able to resolve all of her symptoms, but goal is to at least have some improvement as well as her maintain safety.  Patient has paroxysmal atrial fibrillation without known recurrence in the last several years.  We will continue with every other day amiodarone as she has had some improvement in this as well as improvement in her heart rate.  I suspect her fatigue and difficulty sleeping is likely also contributed by untreated sleep apnea.  I will place referral for evaluation and management.  She is scheduled for  INR check in 2 weeks in our office, will see her back for follow up on orthostatic hypotension in 3 months or sooner if needed.   Miquel Dunn, MSN, APRN, FNP-C [facility]. [place] Office: [phone] Fax: [phone]

## 2019-01-12 DIAGNOSIS — N186 End stage renal disease: Secondary | ICD-10-CM | POA: Diagnosis not present

## 2019-01-12 DIAGNOSIS — N2581 Secondary hyperparathyroidism of renal origin: Secondary | ICD-10-CM | POA: Diagnosis not present

## 2019-01-12 DIAGNOSIS — D689 Coagulation defect, unspecified: Secondary | ICD-10-CM | POA: Diagnosis not present

## 2019-01-14 DIAGNOSIS — N2581 Secondary hyperparathyroidism of renal origin: Secondary | ICD-10-CM | POA: Diagnosis not present

## 2019-01-14 DIAGNOSIS — N186 End stage renal disease: Secondary | ICD-10-CM | POA: Diagnosis not present

## 2019-01-17 DIAGNOSIS — N186 End stage renal disease: Secondary | ICD-10-CM | POA: Diagnosis not present

## 2019-01-17 DIAGNOSIS — N2581 Secondary hyperparathyroidism of renal origin: Secondary | ICD-10-CM | POA: Diagnosis not present

## 2019-01-19 DIAGNOSIS — Z992 Dependence on renal dialysis: Secondary | ICD-10-CM | POA: Diagnosis not present

## 2019-01-19 DIAGNOSIS — N2581 Secondary hyperparathyroidism of renal origin: Secondary | ICD-10-CM | POA: Diagnosis not present

## 2019-01-19 DIAGNOSIS — N186 End stage renal disease: Secondary | ICD-10-CM | POA: Diagnosis not present

## 2019-01-19 DIAGNOSIS — I158 Other secondary hypertension: Secondary | ICD-10-CM | POA: Diagnosis not present

## 2019-01-19 DIAGNOSIS — D631 Anemia in chronic kidney disease: Secondary | ICD-10-CM | POA: Diagnosis not present

## 2019-01-21 DIAGNOSIS — N186 End stage renal disease: Secondary | ICD-10-CM | POA: Diagnosis not present

## 2019-01-21 DIAGNOSIS — N2581 Secondary hyperparathyroidism of renal origin: Secondary | ICD-10-CM | POA: Diagnosis not present

## 2019-01-21 DIAGNOSIS — D631 Anemia in chronic kidney disease: Secondary | ICD-10-CM | POA: Diagnosis not present

## 2019-01-24 DIAGNOSIS — D689 Coagulation defect, unspecified: Secondary | ICD-10-CM | POA: Diagnosis not present

## 2019-01-24 DIAGNOSIS — N186 End stage renal disease: Secondary | ICD-10-CM | POA: Diagnosis not present

## 2019-01-24 DIAGNOSIS — N2581 Secondary hyperparathyroidism of renal origin: Secondary | ICD-10-CM | POA: Diagnosis not present

## 2019-01-24 DIAGNOSIS — D631 Anemia in chronic kidney disease: Secondary | ICD-10-CM | POA: Diagnosis not present

## 2019-01-26 DIAGNOSIS — N2581 Secondary hyperparathyroidism of renal origin: Secondary | ICD-10-CM | POA: Diagnosis not present

## 2019-01-26 DIAGNOSIS — D631 Anemia in chronic kidney disease: Secondary | ICD-10-CM | POA: Diagnosis not present

## 2019-01-26 DIAGNOSIS — N186 End stage renal disease: Secondary | ICD-10-CM | POA: Diagnosis not present

## 2019-01-26 DIAGNOSIS — E1129 Type 2 diabetes mellitus with other diabetic kidney complication: Secondary | ICD-10-CM | POA: Diagnosis not present

## 2019-01-27 ENCOUNTER — Other Ambulatory Visit: Payer: Self-pay

## 2019-01-27 ENCOUNTER — Telehealth: Payer: Self-pay | Admitting: Family Medicine

## 2019-01-27 ENCOUNTER — Ambulatory Visit (INDEPENDENT_AMBULATORY_CARE_PROVIDER_SITE_OTHER): Payer: Medicare Other | Admitting: Cardiology

## 2019-01-27 DIAGNOSIS — Z7901 Long term (current) use of anticoagulants: Secondary | ICD-10-CM

## 2019-01-27 DIAGNOSIS — I480 Paroxysmal atrial fibrillation: Secondary | ICD-10-CM

## 2019-01-27 DIAGNOSIS — Z5181 Encounter for therapeutic drug level monitoring: Secondary | ICD-10-CM | POA: Diagnosis not present

## 2019-01-27 LAB — POCT INR: INR: 2.1 (ref 2.0–3.0)

## 2019-01-27 NOTE — Telephone Encounter (Signed)
Gerlene Fee dropped off disability forms for the patient  Fax forms to [phone]  Disposition: Dr's Folder

## 2019-01-27 NOTE — Telephone Encounter (Signed)
This was supposed to go to  Dr. Elease Hashimoto for Lafonda Mosses. I had multiple charts open when making a telephone encounter.

## 2019-01-27 NOTE — Progress Notes (Signed)
INR within range, no bleeding diathesis. Continue with current regimen. Repeat INR in 4 weeks for follow up

## 2019-01-28 DIAGNOSIS — N2581 Secondary hyperparathyroidism of renal origin: Secondary | ICD-10-CM | POA: Diagnosis not present

## 2019-01-28 DIAGNOSIS — D631 Anemia in chronic kidney disease: Secondary | ICD-10-CM | POA: Diagnosis not present

## 2019-01-28 DIAGNOSIS — N186 End stage renal disease: Secondary | ICD-10-CM | POA: Diagnosis not present

## 2019-01-31 DIAGNOSIS — D631 Anemia in chronic kidney disease: Secondary | ICD-10-CM | POA: Diagnosis not present

## 2019-01-31 DIAGNOSIS — N2581 Secondary hyperparathyroidism of renal origin: Secondary | ICD-10-CM | POA: Diagnosis not present

## 2019-01-31 DIAGNOSIS — N186 End stage renal disease: Secondary | ICD-10-CM | POA: Diagnosis not present

## 2019-02-01 ENCOUNTER — Encounter: Payer: Self-pay | Admitting: Family Medicine

## 2019-02-01 ENCOUNTER — Other Ambulatory Visit: Payer: Self-pay

## 2019-02-01 ENCOUNTER — Other Ambulatory Visit: Payer: Self-pay | Admitting: Cardiology

## 2019-02-01 ENCOUNTER — Ambulatory Visit (INDEPENDENT_AMBULATORY_CARE_PROVIDER_SITE_OTHER): Payer: Medicare Other | Admitting: Family Medicine

## 2019-02-01 ENCOUNTER — Other Ambulatory Visit: Payer: Self-pay | Admitting: Family Medicine

## 2019-02-01 VITALS — BP 107/86 | HR 87

## 2019-02-01 DIAGNOSIS — R5383 Other fatigue: Secondary | ICD-10-CM | POA: Diagnosis not present

## 2019-02-01 DIAGNOSIS — Z7901 Long term (current) use of anticoagulants: Secondary | ICD-10-CM

## 2019-02-01 DIAGNOSIS — I959 Hypotension, unspecified: Secondary | ICD-10-CM | POA: Diagnosis not present

## 2019-02-01 DIAGNOSIS — K5900 Constipation, unspecified: Secondary | ICD-10-CM

## 2019-02-01 DIAGNOSIS — I951 Orthostatic hypotension: Secondary | ICD-10-CM

## 2019-02-01 NOTE — Progress Notes (Signed)
Virtual Visit via Telephone Note  I connected with Jane [name] on 02/06/19 at 10:30 AM EDT by telephone and verified that I am speaking with the correct person using two identifiers.   I discussed the limitations, risks, security and privacy concerns of performing an evaluation and management service by telephone and the availability of in person appointments. I also discussed with the patient that there may be a patient responsible charge related to this service. The patient expressed understanding and agreed to proceed.  Location patient: home Location provider: work office Participants present for the call: patient, provider Patient did not have a visit in the prior 7 days to address this/these issue(s).   History of Present Illness: Jane [name] is a 73 yo female with Hx of HTN,OA,atrial fib, and ESRD on hemodialysis among some. Last visit on 09/22/18, when she was c/o fatigue. Coreg dose was decreased from 25 mg bid to 12.5 mg bid.  Coreg was discontinued due to hypotension.  Lab Results  Component Value Date   WBC 5.5 09/22/2018   HGB 10.7 (L) 09/22/2018   HCT 33.0 (L) 09/22/2018   MCV 93.6 09/22/2018   PLT 234.0 09/22/2018   Lab Results  Component Value Date   TSH 0.94 09/22/2018  According to pt,nephrologist did not think fatigue was related to renal disease.  Since her last visit she has been following with cardiologist, Dr. Einar Gip.  She was placed on 2 new medication to help with orthostatic hypotension. She is on Midodrine 10 mg daily and Florinef 0.1 mg q 2 days.  She is feeling "a little" better  since January 22, 2019. She is tolerating medications well. BP usually elevated in the morning when she first gets up but as the day goes BP starts dropping. Checking BP regularly, SBP's low100's to 150. DBP 80's. She denies chest pain, visual changes,CP,dyspnea,or Jane changes.  Chronic anticoagulation, atrial fib. Amiodarone dose was also decreased from 200 mg daily to 100  mg.  She is on Coumadin. Last INR was at goal, 2.3. Having INR check every 4 weeks.  Constipation,she is on Linzess 145 mcg daily. She is having bowel movements daily, mildly soft at the beginning. She denies abdominal pain, nausea, vomiting, or blood in the stool. Problem is exacerbated by eating "heavy food."   Observations/Objective: Patient sounds cheerful and well on the phone. I do not appreciate any SOB. Speech and thought processing are grossly intact. Patient reported vitals:BP 107/86   Pulse 87   Assessment and Plan:  1. Other fatigue Improved. Attributed to orthostatic hypotension.  2. Hypotension, unspecified hypotension type Adequate hydration. Continue monitoring BP regularly. No changes on Florinef 0.1 mg every other day or Midodrine 10 mg daily. Instructed about warning signs. Continue following with Dr Einar Gip.  3. Chronic anticoagulation INR therapeutic. No changes in Coumadin dose.   4. Constipation, unspecified constipation type Improved. No changes in current management.  Follow Up Instructions: Return in about 5 months (around 07/04/2019) for F/U.   I did not refer this patient for an OV in the next 24 hours for this/these issue(s).  I discussed the assessment and treatment plan with the patient. She was provided an opportunity to ask questions and all were answered. She agreed with the plan and demonstrated an understanding of the instructions.   I provided 21 minutes of non-face-to-face time during this encounter.   Elisa Kutner [name], MD

## 2019-02-02 DIAGNOSIS — D631 Anemia in chronic kidney disease: Secondary | ICD-10-CM | POA: Diagnosis not present

## 2019-02-02 DIAGNOSIS — N186 End stage renal disease: Secondary | ICD-10-CM | POA: Diagnosis not present

## 2019-02-02 DIAGNOSIS — N2581 Secondary hyperparathyroidism of renal origin: Secondary | ICD-10-CM | POA: Diagnosis not present

## 2019-02-04 DIAGNOSIS — N2581 Secondary hyperparathyroidism of renal origin: Secondary | ICD-10-CM | POA: Diagnosis not present

## 2019-02-04 DIAGNOSIS — N186 End stage renal disease: Secondary | ICD-10-CM | POA: Diagnosis not present

## 2019-02-04 DIAGNOSIS — D631 Anemia in chronic kidney disease: Secondary | ICD-10-CM | POA: Diagnosis not present

## 2019-02-07 DIAGNOSIS — D631 Anemia in chronic kidney disease: Secondary | ICD-10-CM | POA: Diagnosis not present

## 2019-02-07 DIAGNOSIS — D689 Coagulation defect, unspecified: Secondary | ICD-10-CM | POA: Diagnosis not present

## 2019-02-07 DIAGNOSIS — N186 End stage renal disease: Secondary | ICD-10-CM | POA: Diagnosis not present

## 2019-02-07 DIAGNOSIS — N2581 Secondary hyperparathyroidism of renal origin: Secondary | ICD-10-CM | POA: Diagnosis not present

## 2019-02-09 DIAGNOSIS — N186 End stage renal disease: Secondary | ICD-10-CM | POA: Diagnosis not present

## 2019-02-09 DIAGNOSIS — N2581 Secondary hyperparathyroidism of renal origin: Secondary | ICD-10-CM | POA: Diagnosis not present

## 2019-02-09 DIAGNOSIS — D631 Anemia in chronic kidney disease: Secondary | ICD-10-CM | POA: Diagnosis not present

## 2019-02-11 DIAGNOSIS — N2581 Secondary hyperparathyroidism of renal origin: Secondary | ICD-10-CM | POA: Diagnosis not present

## 2019-02-11 DIAGNOSIS — N186 End stage renal disease: Secondary | ICD-10-CM | POA: Diagnosis not present

## 2019-02-11 DIAGNOSIS — D631 Anemia in chronic kidney disease: Secondary | ICD-10-CM | POA: Diagnosis not present

## 2019-02-12 ENCOUNTER — Other Ambulatory Visit: Payer: Self-pay | Admitting: Family Medicine

## 2019-02-14 DIAGNOSIS — N186 End stage renal disease: Secondary | ICD-10-CM | POA: Diagnosis not present

## 2019-02-14 DIAGNOSIS — N2581 Secondary hyperparathyroidism of renal origin: Secondary | ICD-10-CM | POA: Diagnosis not present

## 2019-02-14 DIAGNOSIS — D631 Anemia in chronic kidney disease: Secondary | ICD-10-CM | POA: Diagnosis not present

## 2019-02-15 ENCOUNTER — Other Ambulatory Visit: Payer: Self-pay

## 2019-02-15 ENCOUNTER — Telehealth: Payer: Self-pay

## 2019-02-15 DIAGNOSIS — K5900 Constipation, unspecified: Secondary | ICD-10-CM

## 2019-02-15 DIAGNOSIS — I951 Orthostatic hypotension: Secondary | ICD-10-CM

## 2019-02-15 MED ORDER — FLUDROCORTISONE ACETATE 0.1 MG PO TABS: 100.0000 ug | ORAL_TABLET | ORAL | 0 refills | Status: DC

## 2019-02-15 MED ORDER — CARVEDILOL 12.5 MG PO TABS: 12.5000 mg | ORAL_TABLET | ORAL | 3 refills | Status: DC | PRN

## 2019-02-15 NOTE — Telephone Encounter (Signed)
Pt called c/o chest pain, sob, tired and face tingling. Pt stated that BP was 223/80 and 190/110 at dialysis. Pt took 1/2 tab of 25mg  coreg. Symptoms started about 1 week ago. Advised to take coreg 12.5mg  if needed if BP is above 170, discontinue use of Midodrine and take Florinef on Monday and Thursday instead of EOD . Pt is to rest and if chest pain continues should go to ED.

## 2019-02-16 DIAGNOSIS — N2581 Secondary hyperparathyroidism of renal origin: Secondary | ICD-10-CM | POA: Diagnosis not present

## 2019-02-16 DIAGNOSIS — D631 Anemia in chronic kidney disease: Secondary | ICD-10-CM | POA: Diagnosis not present

## 2019-02-16 DIAGNOSIS — N186 End stage renal disease: Secondary | ICD-10-CM | POA: Diagnosis not present

## 2019-02-18 DIAGNOSIS — N2581 Secondary hyperparathyroidism of renal origin: Secondary | ICD-10-CM | POA: Diagnosis not present

## 2019-02-18 DIAGNOSIS — N186 End stage renal disease: Secondary | ICD-10-CM | POA: Diagnosis not present

## 2019-02-18 DIAGNOSIS — D631 Anemia in chronic kidney disease: Secondary | ICD-10-CM | POA: Diagnosis not present

## 2019-02-19 DIAGNOSIS — Z992 Dependence on renal dialysis: Secondary | ICD-10-CM | POA: Diagnosis not present

## 2019-02-19 DIAGNOSIS — I158 Other secondary hypertension: Secondary | ICD-10-CM | POA: Diagnosis not present

## 2019-02-19 DIAGNOSIS — N186 End stage renal disease: Secondary | ICD-10-CM | POA: Diagnosis not present

## 2019-02-21 DIAGNOSIS — N2581 Secondary hyperparathyroidism of renal origin: Secondary | ICD-10-CM | POA: Diagnosis not present

## 2019-02-21 DIAGNOSIS — D509 Iron deficiency anemia, unspecified: Secondary | ICD-10-CM | POA: Diagnosis not present

## 2019-02-21 DIAGNOSIS — Z992 Dependence on renal dialysis: Secondary | ICD-10-CM | POA: Diagnosis not present

## 2019-02-21 DIAGNOSIS — D631 Anemia in chronic kidney disease: Secondary | ICD-10-CM | POA: Diagnosis not present

## 2019-02-21 DIAGNOSIS — N186 End stage renal disease: Secondary | ICD-10-CM | POA: Diagnosis not present

## 2019-02-21 DIAGNOSIS — D689 Coagulation defect, unspecified: Secondary | ICD-10-CM | POA: Diagnosis not present

## 2019-02-23 DIAGNOSIS — D509 Iron deficiency anemia, unspecified: Secondary | ICD-10-CM | POA: Diagnosis not present

## 2019-02-23 DIAGNOSIS — N2581 Secondary hyperparathyroidism of renal origin: Secondary | ICD-10-CM | POA: Diagnosis not present

## 2019-02-23 DIAGNOSIS — Z992 Dependence on renal dialysis: Secondary | ICD-10-CM | POA: Diagnosis not present

## 2019-02-23 DIAGNOSIS — N186 End stage renal disease: Secondary | ICD-10-CM | POA: Diagnosis not present

## 2019-02-23 DIAGNOSIS — D631 Anemia in chronic kidney disease: Secondary | ICD-10-CM | POA: Diagnosis not present

## 2019-02-25 DIAGNOSIS — D509 Iron deficiency anemia, unspecified: Secondary | ICD-10-CM | POA: Diagnosis not present

## 2019-02-25 DIAGNOSIS — Z992 Dependence on renal dialysis: Secondary | ICD-10-CM | POA: Diagnosis not present

## 2019-02-25 DIAGNOSIS — D631 Anemia in chronic kidney disease: Secondary | ICD-10-CM | POA: Diagnosis not present

## 2019-02-25 DIAGNOSIS — N186 End stage renal disease: Secondary | ICD-10-CM | POA: Diagnosis not present

## 2019-02-25 DIAGNOSIS — N2581 Secondary hyperparathyroidism of renal origin: Secondary | ICD-10-CM | POA: Diagnosis not present

## 2019-02-28 DIAGNOSIS — D631 Anemia in chronic kidney disease: Secondary | ICD-10-CM | POA: Diagnosis not present

## 2019-02-28 DIAGNOSIS — N2581 Secondary hyperparathyroidism of renal origin: Secondary | ICD-10-CM | POA: Diagnosis not present

## 2019-02-28 DIAGNOSIS — Z992 Dependence on renal dialysis: Secondary | ICD-10-CM | POA: Diagnosis not present

## 2019-02-28 DIAGNOSIS — D509 Iron deficiency anemia, unspecified: Secondary | ICD-10-CM | POA: Diagnosis not present

## 2019-02-28 DIAGNOSIS — N186 End stage renal disease: Secondary | ICD-10-CM | POA: Diagnosis not present

## 2019-03-01 ENCOUNTER — Ambulatory Visit (INDEPENDENT_AMBULATORY_CARE_PROVIDER_SITE_OTHER): Payer: Medicare Other | Admitting: Cardiology

## 2019-03-01 ENCOUNTER — Other Ambulatory Visit: Payer: Self-pay

## 2019-03-01 DIAGNOSIS — Z7901 Long term (current) use of anticoagulants: Secondary | ICD-10-CM | POA: Diagnosis not present

## 2019-03-01 DIAGNOSIS — Z5181 Encounter for therapeutic drug level monitoring: Secondary | ICD-10-CM

## 2019-03-01 DIAGNOSIS — I480 Paroxysmal atrial fibrillation: Secondary | ICD-10-CM

## 2019-03-01 LAB — POCT INR: INR: 2.5 (ref 2.0–3.0)

## 2019-03-01 NOTE — Progress Notes (Signed)
INR within range, will continue the same. No bleeding reported. Will repeat INR in 1 month.

## 2019-03-02 DIAGNOSIS — N186 End stage renal disease: Secondary | ICD-10-CM | POA: Diagnosis not present

## 2019-03-02 DIAGNOSIS — D509 Iron deficiency anemia, unspecified: Secondary | ICD-10-CM | POA: Diagnosis not present

## 2019-03-02 DIAGNOSIS — N2581 Secondary hyperparathyroidism of renal origin: Secondary | ICD-10-CM | POA: Diagnosis not present

## 2019-03-02 DIAGNOSIS — D631 Anemia in chronic kidney disease: Secondary | ICD-10-CM | POA: Diagnosis not present

## 2019-03-02 DIAGNOSIS — Z992 Dependence on renal dialysis: Secondary | ICD-10-CM | POA: Diagnosis not present

## 2019-03-04 DIAGNOSIS — N186 End stage renal disease: Secondary | ICD-10-CM | POA: Diagnosis not present

## 2019-03-04 DIAGNOSIS — D509 Iron deficiency anemia, unspecified: Secondary | ICD-10-CM | POA: Diagnosis not present

## 2019-03-04 DIAGNOSIS — Z992 Dependence on renal dialysis: Secondary | ICD-10-CM | POA: Diagnosis not present

## 2019-03-04 DIAGNOSIS — N2581 Secondary hyperparathyroidism of renal origin: Secondary | ICD-10-CM | POA: Diagnosis not present

## 2019-03-04 DIAGNOSIS — D631 Anemia in chronic kidney disease: Secondary | ICD-10-CM | POA: Diagnosis not present

## 2019-03-07 DIAGNOSIS — N186 End stage renal disease: Secondary | ICD-10-CM | POA: Diagnosis not present

## 2019-03-07 DIAGNOSIS — Z992 Dependence on renal dialysis: Secondary | ICD-10-CM | POA: Diagnosis not present

## 2019-03-07 DIAGNOSIS — D689 Coagulation defect, unspecified: Secondary | ICD-10-CM | POA: Diagnosis not present

## 2019-03-07 DIAGNOSIS — D509 Iron deficiency anemia, unspecified: Secondary | ICD-10-CM | POA: Diagnosis not present

## 2019-03-07 DIAGNOSIS — D631 Anemia in chronic kidney disease: Secondary | ICD-10-CM | POA: Diagnosis not present

## 2019-03-07 DIAGNOSIS — N2581 Secondary hyperparathyroidism of renal origin: Secondary | ICD-10-CM | POA: Diagnosis not present

## 2019-03-08 ENCOUNTER — Other Ambulatory Visit: Payer: Self-pay

## 2019-03-08 ENCOUNTER — Telehealth (INDEPENDENT_AMBULATORY_CARE_PROVIDER_SITE_OTHER): Payer: Medicare Other | Admitting: Family Medicine

## 2019-03-08 ENCOUNTER — Encounter: Payer: Self-pay | Admitting: Family Medicine

## 2019-03-08 DIAGNOSIS — Z992 Dependence on renal dialysis: Secondary | ICD-10-CM | POA: Diagnosis not present

## 2019-03-08 DIAGNOSIS — N898 Other specified noninflammatory disorders of vagina: Secondary | ICD-10-CM | POA: Diagnosis not present

## 2019-03-08 DIAGNOSIS — N186 End stage renal disease: Secondary | ICD-10-CM

## 2019-03-08 DIAGNOSIS — Z7901 Long term (current) use of anticoagulants: Secondary | ICD-10-CM | POA: Diagnosis not present

## 2019-03-08 DIAGNOSIS — L0201 Cutaneous abscess of face: Secondary | ICD-10-CM | POA: Diagnosis not present

## 2019-03-08 MED ORDER — DOXYCYCLINE HYCLATE 100 MG PO TABS: 100.0000 mg | ORAL_TABLET | Freq: Two times a day (BID) | ORAL | 0 refills | Status: AC

## 2019-03-08 MED ORDER — TERCONAZOLE 0.4 % VA CREA: 1.0000 | TOPICAL_CREAM | Freq: Every day | VAGINAL | 1 refills | Status: DC

## 2019-03-08 MED ORDER — FLUCONAZOLE 150 MG PO TABS: 150.0000 mg | ORAL_TABLET | Freq: Once | ORAL | 0 refills | Status: AC

## 2019-03-08 NOTE — Progress Notes (Signed)
Virtual Visit via Telephone Note  I connected with Jane [name] on 03/08/19 at  4:00 PM EDT by telephone and verified that I am speaking with the correct person using two identifiers.   I discussed the limitations, risks, security and privacy concerns of performing an evaluation and management service by telephone and the availability of in person appointments. I also discussed with the patient that there may be a patient responsible charge related to this service. The patient expressed understanding and agreed to proceed.  Location patient: home Location provider: work office Participants present for the call: patient, provider Patient did not have a visit in the prior 7 days to address this/these issue(s).   History of Present Illness: Ms Jane [name] is a 73 yo female with Hx of atrial fib on anticoagulation,HTN,and ESRD on hemodialysis who is c/o having a "cyst" on lateral aspect of right cheek. She can not see it but can feel it, now it is > quarter size She noted lesion initially in 09/2018, progressively getting bigger. It is tender, "hot", and "hard"  She tried to squeeze it and nothing came out.  She denies having fever, chills, body aches, unusual fatigue, or oral lesions. Negative for visual changes,earache,sore throat, or dysphasia. She has not tried OTC medications. She thinks it is related to a ingrown facial hair.  She has another similar lesion just noted on neck.  She is also c/o "heavy" vaginal discharge (whitish)and mild vaginal pruritus. She attributes this symptoms to a yeast infection. She denies pelvic pain, vaginal bleeding, or urinary symptoms. She has not used OTC medication. She is not sexually active.   Observations/Objective: Patient sounds cheerful and well on the phone. I do not appreciate any SOB. Speech and thought processing are grossly intact. Patient reported vitals:She has not checked BP.  Assessment and Plan: 1. Facial abscess Provided history  associated with an infectious process. Recommend continuing with local heat. Avoid manipulation of lesion. Doxycycline 100 mg twice daily with food x7 days. We discussed some side effects of antibiotics. If not greatly improved in 3 days, she was instructed to arrange a face-to-face appointment.  - doxycycline (VIBRA-TABS) 100 MG tablet; Take 1 tablet (100 mg total) by mouth 2 (two) times daily for 7 days.  Dispense: 14 tablet; Refill: 0  2. Vaginal discharge Symptoms are suggestive of fungal etiology. Recommend trying topical antimycotic medication while taking antibiotic. If when she finishes antibiotic treatment, she is a still having symptoms, she can take Diflucan 150 mg x 1.  - terconazole (TERAZOL 7) 0.4 % vaginal cream; Place 1 applicator vaginally at bedtime.  Dispense: 45 g; Refill: 1 - fluconazole (DIFLUCAN) 150 MG tablet; Take 1 tablet (150 mg total) by mouth once for 1 dose.  Dispense: 1 tablet; Refill: 0  3. Chronic anticoagulation Educated about serious risk of med interaction of coumadin with meds added today. She needs to arrange a Coumadin clinic appointment to have INR check in 2 to 3 days, she is also supposed to let them know that she is planning on taking Diflucan if she has persisting symptoms.  4. ESRD on hemodialysis ([place]) Instructed to let nephrology know that she is going to take Diflucan.   Follow Up Instructions:  F/U as needed. Instructed about warning signs.  I did not refer this patient for an OV in the next 24 hours for this/these issue(s).  I discussed the assessment and treatment plan with the patient. She was provided an opportunity to ask questions and all were answered.  She agreed with the plan and demonstrated an understanding of the instructions.   She was advised to call back or seek an in-person evaluation if the symptoms worsen or if the condition fails to improve as anticipated.  I provided 23 minutes of non-face-to-face time during this  encounter.   Lanson Randle [name], MD

## 2019-03-09 DIAGNOSIS — N186 End stage renal disease: Secondary | ICD-10-CM | POA: Diagnosis not present

## 2019-03-09 DIAGNOSIS — D509 Iron deficiency anemia, unspecified: Secondary | ICD-10-CM | POA: Diagnosis not present

## 2019-03-09 DIAGNOSIS — Z992 Dependence on renal dialysis: Secondary | ICD-10-CM | POA: Diagnosis not present

## 2019-03-09 DIAGNOSIS — D631 Anemia in chronic kidney disease: Secondary | ICD-10-CM | POA: Diagnosis not present

## 2019-03-09 DIAGNOSIS — N2581 Secondary hyperparathyroidism of renal origin: Secondary | ICD-10-CM | POA: Diagnosis not present

## 2019-03-11 DIAGNOSIS — D509 Iron deficiency anemia, unspecified: Secondary | ICD-10-CM | POA: Diagnosis not present

## 2019-03-11 DIAGNOSIS — N186 End stage renal disease: Secondary | ICD-10-CM | POA: Diagnosis not present

## 2019-03-11 DIAGNOSIS — Z992 Dependence on renal dialysis: Secondary | ICD-10-CM | POA: Diagnosis not present

## 2019-03-11 DIAGNOSIS — N2581 Secondary hyperparathyroidism of renal origin: Secondary | ICD-10-CM | POA: Diagnosis not present

## 2019-03-11 DIAGNOSIS — D631 Anemia in chronic kidney disease: Secondary | ICD-10-CM | POA: Diagnosis not present

## 2019-03-14 DIAGNOSIS — N186 End stage renal disease: Secondary | ICD-10-CM | POA: Diagnosis not present

## 2019-03-14 DIAGNOSIS — D509 Iron deficiency anemia, unspecified: Secondary | ICD-10-CM | POA: Diagnosis not present

## 2019-03-14 DIAGNOSIS — D631 Anemia in chronic kidney disease: Secondary | ICD-10-CM | POA: Diagnosis not present

## 2019-03-14 DIAGNOSIS — Z992 Dependence on renal dialysis: Secondary | ICD-10-CM | POA: Diagnosis not present

## 2019-03-14 DIAGNOSIS — N2581 Secondary hyperparathyroidism of renal origin: Secondary | ICD-10-CM | POA: Diagnosis not present

## 2019-03-16 DIAGNOSIS — Z992 Dependence on renal dialysis: Secondary | ICD-10-CM | POA: Diagnosis not present

## 2019-03-16 DIAGNOSIS — N2581 Secondary hyperparathyroidism of renal origin: Secondary | ICD-10-CM | POA: Diagnosis not present

## 2019-03-16 DIAGNOSIS — N186 End stage renal disease: Secondary | ICD-10-CM | POA: Diagnosis not present

## 2019-03-16 DIAGNOSIS — D509 Iron deficiency anemia, unspecified: Secondary | ICD-10-CM | POA: Diagnosis not present

## 2019-03-16 DIAGNOSIS — D631 Anemia in chronic kidney disease: Secondary | ICD-10-CM | POA: Diagnosis not present

## 2019-03-18 DIAGNOSIS — N2581 Secondary hyperparathyroidism of renal origin: Secondary | ICD-10-CM | POA: Diagnosis not present

## 2019-03-18 DIAGNOSIS — Z992 Dependence on renal dialysis: Secondary | ICD-10-CM | POA: Diagnosis not present

## 2019-03-18 DIAGNOSIS — D509 Iron deficiency anemia, unspecified: Secondary | ICD-10-CM | POA: Diagnosis not present

## 2019-03-18 DIAGNOSIS — N186 End stage renal disease: Secondary | ICD-10-CM | POA: Diagnosis not present

## 2019-03-18 DIAGNOSIS — D631 Anemia in chronic kidney disease: Secondary | ICD-10-CM | POA: Diagnosis not present

## 2019-03-21 DIAGNOSIS — N186 End stage renal disease: Secondary | ICD-10-CM | POA: Diagnosis not present

## 2019-03-21 DIAGNOSIS — D631 Anemia in chronic kidney disease: Secondary | ICD-10-CM | POA: Diagnosis not present

## 2019-03-21 DIAGNOSIS — Z992 Dependence on renal dialysis: Secondary | ICD-10-CM | POA: Diagnosis not present

## 2019-03-21 DIAGNOSIS — D689 Coagulation defect, unspecified: Secondary | ICD-10-CM | POA: Diagnosis not present

## 2019-03-21 DIAGNOSIS — D509 Iron deficiency anemia, unspecified: Secondary | ICD-10-CM | POA: Diagnosis not present

## 2019-03-21 DIAGNOSIS — N2581 Secondary hyperparathyroidism of renal origin: Secondary | ICD-10-CM | POA: Diagnosis not present

## 2019-03-22 ENCOUNTER — Other Ambulatory Visit: Payer: Self-pay | Admitting: Family Medicine

## 2019-03-22 DIAGNOSIS — Z992 Dependence on renal dialysis: Secondary | ICD-10-CM | POA: Diagnosis not present

## 2019-03-22 DIAGNOSIS — N186 End stage renal disease: Secondary | ICD-10-CM | POA: Diagnosis not present

## 2019-03-22 DIAGNOSIS — I158 Other secondary hypertension: Secondary | ICD-10-CM | POA: Diagnosis not present

## 2019-03-23 DIAGNOSIS — N2581 Secondary hyperparathyroidism of renal origin: Secondary | ICD-10-CM | POA: Diagnosis not present

## 2019-03-23 DIAGNOSIS — D631 Anemia in chronic kidney disease: Secondary | ICD-10-CM | POA: Diagnosis not present

## 2019-03-23 DIAGNOSIS — N186 End stage renal disease: Secondary | ICD-10-CM | POA: Diagnosis not present

## 2019-03-23 DIAGNOSIS — Z23 Encounter for immunization: Secondary | ICD-10-CM | POA: Diagnosis not present

## 2019-03-23 DIAGNOSIS — Z992 Dependence on renal dialysis: Secondary | ICD-10-CM | POA: Diagnosis not present

## 2019-03-25 DIAGNOSIS — N2581 Secondary hyperparathyroidism of renal origin: Secondary | ICD-10-CM | POA: Diagnosis not present

## 2019-03-25 DIAGNOSIS — D631 Anemia in chronic kidney disease: Secondary | ICD-10-CM | POA: Diagnosis not present

## 2019-03-25 DIAGNOSIS — Z23 Encounter for immunization: Secondary | ICD-10-CM | POA: Diagnosis not present

## 2019-03-25 DIAGNOSIS — Z992 Dependence on renal dialysis: Secondary | ICD-10-CM | POA: Diagnosis not present

## 2019-03-25 DIAGNOSIS — N186 End stage renal disease: Secondary | ICD-10-CM | POA: Diagnosis not present

## 2019-03-28 DIAGNOSIS — D631 Anemia in chronic kidney disease: Secondary | ICD-10-CM | POA: Diagnosis not present

## 2019-03-28 DIAGNOSIS — N2581 Secondary hyperparathyroidism of renal origin: Secondary | ICD-10-CM | POA: Diagnosis not present

## 2019-03-28 DIAGNOSIS — Z23 Encounter for immunization: Secondary | ICD-10-CM | POA: Diagnosis not present

## 2019-03-28 DIAGNOSIS — N186 End stage renal disease: Secondary | ICD-10-CM | POA: Diagnosis not present

## 2019-03-28 DIAGNOSIS — Z992 Dependence on renal dialysis: Secondary | ICD-10-CM | POA: Diagnosis not present

## 2019-03-30 DIAGNOSIS — Z23 Encounter for immunization: Secondary | ICD-10-CM | POA: Diagnosis not present

## 2019-03-30 DIAGNOSIS — Z992 Dependence on renal dialysis: Secondary | ICD-10-CM | POA: Diagnosis not present

## 2019-03-30 DIAGNOSIS — N2581 Secondary hyperparathyroidism of renal origin: Secondary | ICD-10-CM | POA: Diagnosis not present

## 2019-03-30 DIAGNOSIS — D631 Anemia in chronic kidney disease: Secondary | ICD-10-CM | POA: Diagnosis not present

## 2019-03-30 DIAGNOSIS — N186 End stage renal disease: Secondary | ICD-10-CM | POA: Diagnosis not present

## 2019-03-31 ENCOUNTER — Other Ambulatory Visit: Payer: Self-pay

## 2019-03-31 ENCOUNTER — Ambulatory Visit (INDEPENDENT_AMBULATORY_CARE_PROVIDER_SITE_OTHER): Payer: Medicare Other | Admitting: Cardiology

## 2019-03-31 DIAGNOSIS — Z7901 Long term (current) use of anticoagulants: Secondary | ICD-10-CM

## 2019-03-31 DIAGNOSIS — Z5181 Encounter for therapeutic drug level monitoring: Secondary | ICD-10-CM | POA: Diagnosis not present

## 2019-03-31 DIAGNOSIS — I480 Paroxysmal atrial fibrillation: Secondary | ICD-10-CM | POA: Diagnosis not present

## 2019-03-31 LAB — POCT INR: INR: 2.6 (ref 2.0–3.0)

## 2019-03-31 NOTE — Progress Notes (Signed)
Warfarin Anticoagulation Tracking    Current maintenance plan:  7.5 mg every Tue, Thu; 5 mg all other days [x] No changeStart Over  Maintenance plan weekly total: 40 mg Tablets on hand: 7.5 mg & 5 mg  Total dose from past 7 days: 40 mg     No changes, INR within range. Will repeat in 1 month

## 2019-04-01 DIAGNOSIS — N2581 Secondary hyperparathyroidism of renal origin: Secondary | ICD-10-CM | POA: Diagnosis not present

## 2019-04-01 DIAGNOSIS — Z23 Encounter for immunization: Secondary | ICD-10-CM | POA: Diagnosis not present

## 2019-04-01 DIAGNOSIS — D631 Anemia in chronic kidney disease: Secondary | ICD-10-CM | POA: Diagnosis not present

## 2019-04-01 DIAGNOSIS — Z992 Dependence on renal dialysis: Secondary | ICD-10-CM | POA: Diagnosis not present

## 2019-04-01 DIAGNOSIS — N186 End stage renal disease: Secondary | ICD-10-CM | POA: Diagnosis not present

## 2019-04-04 DIAGNOSIS — D631 Anemia in chronic kidney disease: Secondary | ICD-10-CM | POA: Diagnosis not present

## 2019-04-04 DIAGNOSIS — Z992 Dependence on renal dialysis: Secondary | ICD-10-CM | POA: Diagnosis not present

## 2019-04-04 DIAGNOSIS — N2581 Secondary hyperparathyroidism of renal origin: Secondary | ICD-10-CM | POA: Diagnosis not present

## 2019-04-04 DIAGNOSIS — Z23 Encounter for immunization: Secondary | ICD-10-CM | POA: Diagnosis not present

## 2019-04-04 DIAGNOSIS — D689 Coagulation defect, unspecified: Secondary | ICD-10-CM | POA: Diagnosis not present

## 2019-04-04 DIAGNOSIS — N186 End stage renal disease: Secondary | ICD-10-CM | POA: Diagnosis not present

## 2019-04-06 DIAGNOSIS — Z992 Dependence on renal dialysis: Secondary | ICD-10-CM | POA: Diagnosis not present

## 2019-04-06 DIAGNOSIS — Z23 Encounter for immunization: Secondary | ICD-10-CM | POA: Diagnosis not present

## 2019-04-06 DIAGNOSIS — N186 End stage renal disease: Secondary | ICD-10-CM | POA: Diagnosis not present

## 2019-04-06 DIAGNOSIS — D631 Anemia in chronic kidney disease: Secondary | ICD-10-CM | POA: Diagnosis not present

## 2019-04-06 DIAGNOSIS — N2581 Secondary hyperparathyroidism of renal origin: Secondary | ICD-10-CM | POA: Diagnosis not present

## 2019-04-08 DIAGNOSIS — Z992 Dependence on renal dialysis: Secondary | ICD-10-CM | POA: Diagnosis not present

## 2019-04-08 DIAGNOSIS — N2581 Secondary hyperparathyroidism of renal origin: Secondary | ICD-10-CM | POA: Diagnosis not present

## 2019-04-08 DIAGNOSIS — D631 Anemia in chronic kidney disease: Secondary | ICD-10-CM | POA: Diagnosis not present

## 2019-04-08 DIAGNOSIS — Z23 Encounter for immunization: Secondary | ICD-10-CM | POA: Diagnosis not present

## 2019-04-08 DIAGNOSIS — N186 End stage renal disease: Secondary | ICD-10-CM | POA: Diagnosis not present

## 2019-04-11 DIAGNOSIS — Z23 Encounter for immunization: Secondary | ICD-10-CM | POA: Diagnosis not present

## 2019-04-11 DIAGNOSIS — N2581 Secondary hyperparathyroidism of renal origin: Secondary | ICD-10-CM | POA: Diagnosis not present

## 2019-04-11 DIAGNOSIS — Z992 Dependence on renal dialysis: Secondary | ICD-10-CM | POA: Diagnosis not present

## 2019-04-11 DIAGNOSIS — D631 Anemia in chronic kidney disease: Secondary | ICD-10-CM | POA: Diagnosis not present

## 2019-04-11 DIAGNOSIS — N186 End stage renal disease: Secondary | ICD-10-CM | POA: Diagnosis not present

## 2019-04-12 ENCOUNTER — Encounter: Payer: Self-pay | Admitting: Cardiology

## 2019-04-12 ENCOUNTER — Other Ambulatory Visit: Payer: Self-pay

## 2019-04-12 ENCOUNTER — Ambulatory Visit (INDEPENDENT_AMBULATORY_CARE_PROVIDER_SITE_OTHER): Payer: Medicare Other | Admitting: Cardiology

## 2019-04-12 VITALS — Ht 65.0 in | Wt 239.0 lb

## 2019-04-12 DIAGNOSIS — D509 Iron deficiency anemia, unspecified: Secondary | ICD-10-CM | POA: Diagnosis not present

## 2019-04-12 DIAGNOSIS — R0789 Other chest pain: Secondary | ICD-10-CM

## 2019-04-12 DIAGNOSIS — I951 Orthostatic hypotension: Secondary | ICD-10-CM | POA: Diagnosis not present

## 2019-04-12 DIAGNOSIS — N186 End stage renal disease: Secondary | ICD-10-CM | POA: Diagnosis not present

## 2019-04-12 DIAGNOSIS — Z992 Dependence on renal dialysis: Secondary | ICD-10-CM

## 2019-04-12 DIAGNOSIS — I480 Paroxysmal atrial fibrillation: Secondary | ICD-10-CM

## 2019-04-12 NOTE — Progress Notes (Signed)
Primary Physician:  [name], [name], MD   [name], female    DOB: 09-Sep-1945, 73 y.o.   MRN: [id number]  Subjective:    Chief Complaint  Patient presents with  . Orthostatic Hypotenstion    3 onth f/u    HPI: Jane [name]  is a 73 y.o. female  with paroxysmal atrial fibrillation, diagnosed sometime in 1999, has had cardioversion in 2010 while she was in [place] and has been on amiodarone since then. Amiodarone recently decreased due to QT prolongation. She has end-stage renal disease and is presently on hemodialysis on M-W-F. Echocardiogram in April 2019 revealed grade 2 diastolic dysfucntion, trace aortic stenosis, and elevated pulmonary pressures.  Lexiscan nuclear stress test on 09/06/2018 was performed that was considered low risk study.     At her last office visit, she had had improvement in her symptoms with starting fludrocortisone and was feeling well until approximately 1 month ago she again began having drops in her blood pressure towards the end of her dialysis and also after getting home from dialysis.  She states she feels great on her days she is not doing dialysis.  She had also called our office with chest heaviness and markedly elevated blood pressure and was advised to stop midodrine and to take half her Coreg.  She rarely has to take Coreg for elevated blood pressure.  She does continue to have occasional episodes of chest discomfort with walking and certain positions.  Denies any worsening shortness of breath.  Overall though she does continue to feel her symptoms are better than before.  She does report that she has lost some weight and has had to have her dry weight adjusted.  She feels that she is having too much fluid pulled off during dialysis.  States that she was found to have worsening iron levels and had Iron transfusion a few weeks ago.  Also has hemorrhoids that occasionally bleed and is on linzess for management.  INR is managed by [place].  Amiodarone  has also been decreased to every other day due to fatigue and states that this is helping. She does not sleep well at night and does continue to have some fatigue. She reports that she was found to have sleep apnea while living in [place], but did not tolerate CPAP. She has not had any evaluation since living in [place].    Past Medical History:  Diagnosis Date  . Acid reflux   . Atrial fibrillation ([place])   . Bilateral carotid bruits   . Hypertension   . Malaise and fatigue   . Orthostatic hypotension   . Renal disorder   . Shortness of breath   . Sleep apnea   . Syncope     Past Surgical History:  Procedure Laterality Date  . BACK SURGERY    . colon removal  06/2016  . IR GENERIC HISTORICAL  07/09/2016   IR US GUIDE VASC ACCESS RIGHT 07/09/2016 Arne Cleveland, MD [facility]  . IR GENERIC HISTORICAL  07/09/2016   IR FLUORO GUIDE CV LINE RIGHT 07/09/2016 Arne Cleveland, MD [facility]  . KNEE ARTHROPLASTY    . LAPAROSCOPIC SIGMOID COLECTOMY N/A 07/11/2016   Procedure: LAPAROSCOPIC SIGMOID COLECTOMY;  Surgeon: Clovis Riley, MD;  Location: [facility];  Service: General;  Laterality: N/A;  . TUBAL LIGATION      Social History   Socioeconomic History  . Marital status: Divorced    Spouse name: Not on file  . Number of  children: 4  . Years of education: Not on file  . Highest education level: Not on file  Occupational History  . Not on file  Social Needs  . Financial resource strain: Not on file  . Food insecurity    Worry: Not on file    Inability: Not on file  . Transportation needs    Medical: Not on file    Non-medical: Not on file  Tobacco Use  . Smoking status: Never Smoker  . Smokeless tobacco: Never Used  Substance and Sexual Activity  . Alcohol use: No  . Drug use: No  . Sexual activity: Never  Lifestyle  . Physical activity    Days per week: Not on file    Minutes per session: Not on file  . Stress: Not on file  Relationships  . Social  Herbalist on phone: Not on file    Gets together: Not on file    Attends religious service: Not on file    Active member of club or organization: Not on file    Attends meetings of clubs or organizations: Not on file    Relationship status: Not on file  . Intimate partner violence    Fear of current or ex partner: Not on file    Emotionally abused: Not on file    Physically abused: Not on file    Forced sexual activity: Not on file  Other Topics Concern  . Not on file  Social History Narrative  . Not on file    Review of Systems  Constitution: Positive for malaise/fatigue. Negative for decreased appetite, weight gain and weight loss.  Eyes: Negative for visual disturbance.  Cardiovascular: Positive for chest pain. Negative for claudication, dyspnea on exertion, leg swelling, orthopnea, palpitations and syncope.  Respiratory: Negative for hemoptysis and wheezing.   Endocrine: Negative for cold intolerance and heat intolerance.  Hematologic/Lymphatic: Does not bruise/bleed easily.  Skin: Negative for nail changes.  Musculoskeletal: Negative for muscle weakness and myalgias.  Gastrointestinal: Positive for hemorrhoids (bleeding). Negative for abdominal pain, change in bowel habit, constipation, nausea and vomiting.  Neurological: Positive for dizziness. Negative for difficulty with concentration, focal weakness and headaches.  Psychiatric/Behavioral: Negative for altered mental status and suicidal ideas.  All other systems reviewed and are negative.     Objective:  Height 5\' 5"  (1.651 m), weight 239 lb (108.4 kg), SpO2 97 %. Body mass index is 39.77 kg/m.  Supine BP: 171/72 HR 72 Sitting BP: 168/78 HR 70 Standing BP: 153/75 HR 73    Physical Exam  Constitutional: She is oriented to person, place, and time. Vital signs are normal. She appears well-developed and well-nourished.  HENT:  Head: Normocephalic and atraumatic.  Neck: Normal range of motion.   Cardiovascular: Normal rate, regular rhythm, normal heart sounds and intact distal pulses.  Pulses:      Carotid pulses are on the right side with bruit and on the left side with bruit. Right AV fistula/graft  Pulmonary/Chest: Effort normal and breath sounds normal. No accessory muscle usage. No respiratory distress. She exhibits tenderness.  Abdominal: Soft. Bowel sounds are normal.  Musculoskeletal: Normal range of motion.  Neurological: She is alert and oriented to person, place, and time.  Skin: Skin is warm and dry.  Vitals reviewed.  Radiology: No results found.  Laboratory examination:    CMP Latest Ref Rng & Units 08/04/2016 07/18/2016 07/16/2016  Glucose 65 - 99 mg/dL 88 91 82  BUN 6 - 20 mg/dL  28(H) 17 35(H)  Creatinine 0.44 - 1.00 mg/dL 6.44(H) 7.59(H) 8.75(H)  Sodium 135 - 145 mmol/L 139 133(L) 134(L)  Potassium 3.5 - 5.1 mmol/L 4.9 3.2(L) 3.7  Chloride 101 - 111 mmol/L 104 98(L) 98(L)  CO2 22 - 32 mmol/L 22 23 25   Calcium 8.9 - 10.3 mg/dL 8.8(L) 7.7(L) 7.9(L)  Total Protein 6.5 - 8.1 g/dL 7.1 - -  Total Bilirubin 0.3 - 1.2 mg/dL 0.5 - -  Alkaline Phos 38 - 126 U/L 104 - -  AST 15 - 41 U/L 18 - -  ALT 14 - 54 U/L 11(L) - -   CBC Latest Ref Rng & Units 09/22/2018 01/26/2018 08/04/2016  WBC 4.0 - 10.5 K/uL 5.5 4.4 5.8  Hemoglobin 12.0 - 15.0 g/dL 10.7(L) 10.9(L) 10.1(L)  Hematocrit 36.0 - 46.0 % 33.0(L) 32.9(L) 32.2(L)  Platelets 150.0 - 400.0 K/uL 234.0 149.0(L) 154   Lipid Panel     Component Value Date/Time   CHOL 130 06/27/2016 0433   TRIG 42 06/27/2016 0433   HDL 49 06/27/2016 0433   CHOLHDL 2.7 06/27/2016 0433   VLDL 8 06/27/2016 0433   LDLCALC 73 06/27/2016 0433   HEMOGLOBIN A1C No results found for: HGBA1C, MPG TSH Recent Labs    09/22/18 1032  TSH 0.94    PRN Meds:. Medications Discontinued During This Encounter  Medication Reason  . amiodarone (PACERONE) 200 MG tablet Discontinued by provider  . NON FORMULARY Error   Current Meds   Medication Sig  . albuterol (PROVENTIL HFA;VENTOLIN HFA) 108 (90 Base) MCG/ACT inhaler Inhale 1 puff into the lungs every 6 (six) hours as needed for wheezing or shortness of breath.  Marland Kitchen b complex vitamins capsule Take by mouth.  . carvedilol (COREG) 12.5 MG tablet Take 1 tablet (12.5 mg total) by mouth as needed (if BP is above 170).  . cinacalcet (SENSIPAR) 60 MG tablet Take 60 mg by mouth every other day.  . ethyl chloride spray USE 1 TO 2 SPRAYS TO DIALYSIS ACCESS EVERY TREATMENT  . fludrocortisone (FLORINEF) 0.1 MG tablet Take 1 tablet (100 mcg total) by mouth 2 (two) times a week.  . linaclotide (LINZESS) 145 MCG CAPS capsule Take 1 capsule (145 mcg total) by mouth daily before breakfast.  . multivitamin (RENA-VIT) TABS tablet Take 1 tablet by mouth daily.  Marland Kitchen omeprazole (PRILOSEC) 20 MG capsule TAKE 1 CAPSULE BY MOUTH ONCE DAILY BEFORE BREAKFAST  . sevelamer carbonate (RENVELA) 800 MG tablet Take 800-1,600 mg by mouth See admin instructions. 2 tabs 3 times daily  . terconazole (TERAZOL 7) 0.4 % vaginal cream Place 1 applicator vaginally at bedtime.  Marland Kitchen warfarin (COUMADIN) 5 MG tablet Take 1 tablet (5 mg total) by mouth daily at 6 PM. 7.5mg  T,  Thur; 5mg  all other days    Cardiac Studies:   Echocardiogram 11/24/2017: Left ventricle cavity is normal in size. Mild concentric hypertrophy of the left ventricle. Normal global wall motion. Doppler evidence of grade II (pseudonormal) diastolic dysfunction, elevated LAP. Calculated EF 65%. Left atrial cavity is severely dilated. Mildly restricted aortic valve leaflets with trace aortic stenosis. Aortic valve mean gradient of 6 mmHg, Vmax of 1.8 m/s. Calculated aortic valve area by continuity equation is 2.1 cm. Mild (Grade I) mitral regurgitation. Mild to moderate tricuspid regurgitation. Estimated pulmonary artery systolic pressure 26-71 mmHg.  Lexiscan myoview stress test 09/06/2018:  1. Lexiscan stress test was performed. Exercise capacity  was not assessed. Stress symptoms included dyspnea, dizziness. Peak effect blood pressure was 158/68 mmHg.  The resting and stress electrocardiogram demonstrated normal sinus rhythm, normal resting conduction, no resting arrhythmias and normal rest repolarization. Stress EKG is non diagnostic for ischemia as it is a pharmacologic stress.  2. The overall quality of the study is good. There is no evidence of abnormal lung activity. Stress and rest SPECT images demonstrate homogeneous tracer distribution throughout the myocardium. Gated SPECT imaging reveals normal myocardial thickening and wall motion. The left ventricular ejection fraction was normal (61%).  3. Low risk study.  Carotid artery duplex 12/02/2016: No hemodynamically significant arterial disease in the internal carotid artery bilaterally. There is mild calcific plaque bilaterally. Left carotid is mildly tortuous and may be a source of bruit.  Antegrade vertebral artery flow.  Assessment:     ICD-10-CM   1. Paroxysmal atrial fibrillation ([facility])  I48.0 EKG 12-Lead  2. Orthostatic hypotension  I95.1   3. ESRD on hemodialysis ([facility])  N18.6    Z99.2   4. Iron deficiency anemia, unspecified iron deficiency anemia type  D50.9   5. Musculoskeletal chest pain  R07.89    EKG 04/12/2019: Normal sinus rhythm at 82 bpm, left axis deviation, left anterior fasicular block, poor R wave progression, cannot exclude anteroseptal infarct old. Normal QT interval. No evidence of ischemia.   Recommendations:   Patient is overall doing well, she is now on fludrocortisone every other day and feels great on her nondialysis days.  On her dialysis days she does report for the last 1 to 2 months she has again had significant drops in her blood pressure and after dialysis has to lay down for several hours before she feels well again.  I have encouraged her to use Midodrine as needed, but to keep a close eye on her blood pressure.  She did recently have one  episode where her blood pressure was markedly elevated and had to take half a tablet of Coreg.  We will continue to use Coreg as needed.  She states that her dry weight has recently been adjusted, may need to consider decreasing her volume removal and she will discuss with her nephrologist.  She is also recently had iron transfusion and is feeling much better.    She continues to have musculoskeletal chest pain and is markedly tender on palpation today.  Encouraged her to use heating ice and also Tylenol as needed for pain.  She has not had known recurrence of atrial fibrillation.  Continues to be followed by [place] in our Coumadin clinic.  No active bleeding diathesis reported.  Overall, patient is doing well.  I will see her back in 6 months for follow-up on orthostatic hypotension.  Miquel Dunn, MSN, APRN, FNP-C [facility]. [place] Office: [phone] Fax: [phone]

## 2019-04-13 DIAGNOSIS — Z992 Dependence on renal dialysis: Secondary | ICD-10-CM | POA: Diagnosis not present

## 2019-04-13 DIAGNOSIS — Z23 Encounter for immunization: Secondary | ICD-10-CM | POA: Diagnosis not present

## 2019-04-13 DIAGNOSIS — N2581 Secondary hyperparathyroidism of renal origin: Secondary | ICD-10-CM | POA: Diagnosis not present

## 2019-04-13 DIAGNOSIS — N186 End stage renal disease: Secondary | ICD-10-CM | POA: Diagnosis not present

## 2019-04-13 DIAGNOSIS — D631 Anemia in chronic kidney disease: Secondary | ICD-10-CM | POA: Diagnosis not present

## 2019-04-14 ENCOUNTER — Encounter: Payer: Self-pay | Admitting: Cardiology

## 2019-04-15 DIAGNOSIS — N186 End stage renal disease: Secondary | ICD-10-CM | POA: Diagnosis not present

## 2019-04-15 DIAGNOSIS — Z23 Encounter for immunization: Secondary | ICD-10-CM | POA: Diagnosis not present

## 2019-04-15 DIAGNOSIS — N2581 Secondary hyperparathyroidism of renal origin: Secondary | ICD-10-CM | POA: Diagnosis not present

## 2019-04-15 DIAGNOSIS — Z992 Dependence on renal dialysis: Secondary | ICD-10-CM | POA: Diagnosis not present

## 2019-04-15 DIAGNOSIS — D631 Anemia in chronic kidney disease: Secondary | ICD-10-CM | POA: Diagnosis not present

## 2019-04-18 DIAGNOSIS — Z992 Dependence on renal dialysis: Secondary | ICD-10-CM | POA: Diagnosis not present

## 2019-04-18 DIAGNOSIS — D631 Anemia in chronic kidney disease: Secondary | ICD-10-CM | POA: Diagnosis not present

## 2019-04-18 DIAGNOSIS — D689 Coagulation defect, unspecified: Secondary | ICD-10-CM | POA: Diagnosis not present

## 2019-04-18 DIAGNOSIS — N186 End stage renal disease: Secondary | ICD-10-CM | POA: Diagnosis not present

## 2019-04-18 DIAGNOSIS — Z23 Encounter for immunization: Secondary | ICD-10-CM | POA: Diagnosis not present

## 2019-04-18 DIAGNOSIS — N2581 Secondary hyperparathyroidism of renal origin: Secondary | ICD-10-CM | POA: Diagnosis not present

## 2019-04-20 DIAGNOSIS — N2581 Secondary hyperparathyroidism of renal origin: Secondary | ICD-10-CM | POA: Diagnosis not present

## 2019-04-20 DIAGNOSIS — N186 End stage renal disease: Secondary | ICD-10-CM | POA: Diagnosis not present

## 2019-04-20 DIAGNOSIS — Z992 Dependence on renal dialysis: Secondary | ICD-10-CM | POA: Diagnosis not present

## 2019-04-20 DIAGNOSIS — Z23 Encounter for immunization: Secondary | ICD-10-CM | POA: Diagnosis not present

## 2019-04-20 DIAGNOSIS — D631 Anemia in chronic kidney disease: Secondary | ICD-10-CM | POA: Diagnosis not present

## 2019-04-21 DIAGNOSIS — Z992 Dependence on renal dialysis: Secondary | ICD-10-CM | POA: Diagnosis not present

## 2019-04-21 DIAGNOSIS — N186 End stage renal disease: Secondary | ICD-10-CM | POA: Diagnosis not present

## 2019-04-21 DIAGNOSIS — I158 Other secondary hypertension: Secondary | ICD-10-CM | POA: Diagnosis not present

## 2019-04-22 DIAGNOSIS — N2581 Secondary hyperparathyroidism of renal origin: Secondary | ICD-10-CM | POA: Diagnosis not present

## 2019-04-22 DIAGNOSIS — D631 Anemia in chronic kidney disease: Secondary | ICD-10-CM | POA: Diagnosis not present

## 2019-04-22 DIAGNOSIS — Z992 Dependence on renal dialysis: Secondary | ICD-10-CM | POA: Diagnosis not present

## 2019-04-22 DIAGNOSIS — N186 End stage renal disease: Secondary | ICD-10-CM | POA: Diagnosis not present

## 2019-04-25 DIAGNOSIS — N186 End stage renal disease: Secondary | ICD-10-CM | POA: Diagnosis not present

## 2019-04-25 DIAGNOSIS — Z992 Dependence on renal dialysis: Secondary | ICD-10-CM | POA: Diagnosis not present

## 2019-04-25 DIAGNOSIS — N2581 Secondary hyperparathyroidism of renal origin: Secondary | ICD-10-CM | POA: Diagnosis not present

## 2019-04-25 DIAGNOSIS — D631 Anemia in chronic kidney disease: Secondary | ICD-10-CM | POA: Diagnosis not present

## 2019-04-26 ENCOUNTER — Other Ambulatory Visit: Payer: Self-pay | Admitting: Family Medicine

## 2019-04-26 DIAGNOSIS — K5900 Constipation, unspecified: Secondary | ICD-10-CM

## 2019-04-27 DIAGNOSIS — Z992 Dependence on renal dialysis: Secondary | ICD-10-CM | POA: Diagnosis not present

## 2019-04-27 DIAGNOSIS — E1129 Type 2 diabetes mellitus with other diabetic kidney complication: Secondary | ICD-10-CM | POA: Diagnosis not present

## 2019-04-27 DIAGNOSIS — N2581 Secondary hyperparathyroidism of renal origin: Secondary | ICD-10-CM | POA: Diagnosis not present

## 2019-04-27 DIAGNOSIS — N186 End stage renal disease: Secondary | ICD-10-CM | POA: Diagnosis not present

## 2019-04-27 DIAGNOSIS — D631 Anemia in chronic kidney disease: Secondary | ICD-10-CM | POA: Diagnosis not present

## 2019-04-28 ENCOUNTER — Ambulatory Visit (INDEPENDENT_AMBULATORY_CARE_PROVIDER_SITE_OTHER): Payer: Medicare Other | Admitting: Cardiology

## 2019-04-28 ENCOUNTER — Other Ambulatory Visit: Payer: Self-pay

## 2019-04-28 DIAGNOSIS — Z7901 Long term (current) use of anticoagulants: Secondary | ICD-10-CM

## 2019-04-28 DIAGNOSIS — I480 Paroxysmal atrial fibrillation: Secondary | ICD-10-CM

## 2019-04-28 DIAGNOSIS — Z5181 Encounter for therapeutic drug level monitoring: Secondary | ICD-10-CM | POA: Diagnosis not present

## 2019-04-28 LAB — POCT INR: INR: 2.5 (ref 2.0–3.0)

## 2019-04-29 DIAGNOSIS — N2581 Secondary hyperparathyroidism of renal origin: Secondary | ICD-10-CM | POA: Diagnosis not present

## 2019-04-29 DIAGNOSIS — N186 End stage renal disease: Secondary | ICD-10-CM | POA: Diagnosis not present

## 2019-04-29 DIAGNOSIS — D631 Anemia in chronic kidney disease: Secondary | ICD-10-CM | POA: Diagnosis not present

## 2019-04-29 DIAGNOSIS — Z992 Dependence on renal dialysis: Secondary | ICD-10-CM | POA: Diagnosis not present

## 2019-05-02 DIAGNOSIS — D631 Anemia in chronic kidney disease: Secondary | ICD-10-CM | POA: Diagnosis not present

## 2019-05-02 DIAGNOSIS — N2581 Secondary hyperparathyroidism of renal origin: Secondary | ICD-10-CM | POA: Diagnosis not present

## 2019-05-02 DIAGNOSIS — Z992 Dependence on renal dialysis: Secondary | ICD-10-CM | POA: Diagnosis not present

## 2019-05-02 DIAGNOSIS — N186 End stage renal disease: Secondary | ICD-10-CM | POA: Diagnosis not present

## 2019-05-04 DIAGNOSIS — D689 Coagulation defect, unspecified: Secondary | ICD-10-CM | POA: Diagnosis not present

## 2019-05-04 DIAGNOSIS — D631 Anemia in chronic kidney disease: Secondary | ICD-10-CM | POA: Diagnosis not present

## 2019-05-04 DIAGNOSIS — N2581 Secondary hyperparathyroidism of renal origin: Secondary | ICD-10-CM | POA: Diagnosis not present

## 2019-05-04 DIAGNOSIS — N186 End stage renal disease: Secondary | ICD-10-CM | POA: Diagnosis not present

## 2019-05-04 DIAGNOSIS — Z992 Dependence on renal dialysis: Secondary | ICD-10-CM | POA: Diagnosis not present

## 2019-05-06 DIAGNOSIS — Z992 Dependence on renal dialysis: Secondary | ICD-10-CM | POA: Diagnosis not present

## 2019-05-06 DIAGNOSIS — D631 Anemia in chronic kidney disease: Secondary | ICD-10-CM | POA: Diagnosis not present

## 2019-05-06 DIAGNOSIS — N186 End stage renal disease: Secondary | ICD-10-CM | POA: Diagnosis not present

## 2019-05-06 DIAGNOSIS — N2581 Secondary hyperparathyroidism of renal origin: Secondary | ICD-10-CM | POA: Diagnosis not present

## 2019-05-09 DIAGNOSIS — D631 Anemia in chronic kidney disease: Secondary | ICD-10-CM | POA: Diagnosis not present

## 2019-05-09 DIAGNOSIS — Z992 Dependence on renal dialysis: Secondary | ICD-10-CM | POA: Diagnosis not present

## 2019-05-09 DIAGNOSIS — N186 End stage renal disease: Secondary | ICD-10-CM | POA: Diagnosis not present

## 2019-05-09 DIAGNOSIS — N2581 Secondary hyperparathyroidism of renal origin: Secondary | ICD-10-CM | POA: Diagnosis not present

## 2019-05-11 DIAGNOSIS — N2581 Secondary hyperparathyroidism of renal origin: Secondary | ICD-10-CM | POA: Diagnosis not present

## 2019-05-11 DIAGNOSIS — Z992 Dependence on renal dialysis: Secondary | ICD-10-CM | POA: Diagnosis not present

## 2019-05-11 DIAGNOSIS — D631 Anemia in chronic kidney disease: Secondary | ICD-10-CM | POA: Diagnosis not present

## 2019-05-11 DIAGNOSIS — N186 End stage renal disease: Secondary | ICD-10-CM | POA: Diagnosis not present

## 2019-05-13 DIAGNOSIS — N186 End stage renal disease: Secondary | ICD-10-CM | POA: Diagnosis not present

## 2019-05-13 DIAGNOSIS — N2581 Secondary hyperparathyroidism of renal origin: Secondary | ICD-10-CM | POA: Diagnosis not present

## 2019-05-13 DIAGNOSIS — D631 Anemia in chronic kidney disease: Secondary | ICD-10-CM | POA: Diagnosis not present

## 2019-05-13 DIAGNOSIS — Z992 Dependence on renal dialysis: Secondary | ICD-10-CM | POA: Diagnosis not present

## 2019-05-16 DIAGNOSIS — N186 End stage renal disease: Secondary | ICD-10-CM | POA: Diagnosis not present

## 2019-05-16 DIAGNOSIS — D689 Coagulation defect, unspecified: Secondary | ICD-10-CM | POA: Diagnosis not present

## 2019-05-16 DIAGNOSIS — D631 Anemia in chronic kidney disease: Secondary | ICD-10-CM | POA: Diagnosis not present

## 2019-05-16 DIAGNOSIS — Z992 Dependence on renal dialysis: Secondary | ICD-10-CM | POA: Diagnosis not present

## 2019-05-16 DIAGNOSIS — N2581 Secondary hyperparathyroidism of renal origin: Secondary | ICD-10-CM | POA: Diagnosis not present

## 2019-05-18 DIAGNOSIS — N186 End stage renal disease: Secondary | ICD-10-CM | POA: Diagnosis not present

## 2019-05-18 DIAGNOSIS — D631 Anemia in chronic kidney disease: Secondary | ICD-10-CM | POA: Diagnosis not present

## 2019-05-18 DIAGNOSIS — N2581 Secondary hyperparathyroidism of renal origin: Secondary | ICD-10-CM | POA: Diagnosis not present

## 2019-05-18 DIAGNOSIS — Z992 Dependence on renal dialysis: Secondary | ICD-10-CM | POA: Diagnosis not present

## 2019-05-20 DIAGNOSIS — N186 End stage renal disease: Secondary | ICD-10-CM | POA: Diagnosis not present

## 2019-05-20 DIAGNOSIS — N2581 Secondary hyperparathyroidism of renal origin: Secondary | ICD-10-CM | POA: Diagnosis not present

## 2019-05-20 DIAGNOSIS — Z992 Dependence on renal dialysis: Secondary | ICD-10-CM | POA: Diagnosis not present

## 2019-05-20 DIAGNOSIS — D631 Anemia in chronic kidney disease: Secondary | ICD-10-CM | POA: Diagnosis not present

## 2019-05-22 DIAGNOSIS — I158 Other secondary hypertension: Secondary | ICD-10-CM | POA: Diagnosis not present

## 2019-05-22 DIAGNOSIS — N186 End stage renal disease: Secondary | ICD-10-CM | POA: Diagnosis not present

## 2019-05-22 DIAGNOSIS — Z992 Dependence on renal dialysis: Secondary | ICD-10-CM | POA: Diagnosis not present

## 2019-05-23 DIAGNOSIS — N2581 Secondary hyperparathyroidism of renal origin: Secondary | ICD-10-CM | POA: Diagnosis not present

## 2019-05-23 DIAGNOSIS — Z992 Dependence on renal dialysis: Secondary | ICD-10-CM | POA: Diagnosis not present

## 2019-05-23 DIAGNOSIS — N186 End stage renal disease: Secondary | ICD-10-CM | POA: Diagnosis not present

## 2019-05-25 DIAGNOSIS — Z992 Dependence on renal dialysis: Secondary | ICD-10-CM | POA: Diagnosis not present

## 2019-05-25 DIAGNOSIS — N2581 Secondary hyperparathyroidism of renal origin: Secondary | ICD-10-CM | POA: Diagnosis not present

## 2019-05-25 DIAGNOSIS — N186 End stage renal disease: Secondary | ICD-10-CM | POA: Diagnosis not present

## 2019-05-27 DIAGNOSIS — Z992 Dependence on renal dialysis: Secondary | ICD-10-CM | POA: Diagnosis not present

## 2019-05-27 DIAGNOSIS — N2581 Secondary hyperparathyroidism of renal origin: Secondary | ICD-10-CM | POA: Diagnosis not present

## 2019-05-27 DIAGNOSIS — N186 End stage renal disease: Secondary | ICD-10-CM | POA: Diagnosis not present

## 2019-05-30 DIAGNOSIS — Z992 Dependence on renal dialysis: Secondary | ICD-10-CM | POA: Diagnosis not present

## 2019-05-30 DIAGNOSIS — N186 End stage renal disease: Secondary | ICD-10-CM | POA: Diagnosis not present

## 2019-05-30 DIAGNOSIS — N2581 Secondary hyperparathyroidism of renal origin: Secondary | ICD-10-CM | POA: Diagnosis not present

## 2019-05-30 DIAGNOSIS — D689 Coagulation defect, unspecified: Secondary | ICD-10-CM | POA: Diagnosis not present

## 2019-06-01 DIAGNOSIS — Z992 Dependence on renal dialysis: Secondary | ICD-10-CM | POA: Diagnosis not present

## 2019-06-01 DIAGNOSIS — N2581 Secondary hyperparathyroidism of renal origin: Secondary | ICD-10-CM | POA: Diagnosis not present

## 2019-06-01 DIAGNOSIS — N186 End stage renal disease: Secondary | ICD-10-CM | POA: Diagnosis not present

## 2019-06-02 ENCOUNTER — Ambulatory Visit (INDEPENDENT_AMBULATORY_CARE_PROVIDER_SITE_OTHER): Payer: Medicare Other | Admitting: Cardiology

## 2019-06-02 ENCOUNTER — Other Ambulatory Visit: Payer: Self-pay

## 2019-06-02 DIAGNOSIS — Z5181 Encounter for therapeutic drug level monitoring: Secondary | ICD-10-CM | POA: Diagnosis not present

## 2019-06-02 DIAGNOSIS — Z7901 Long term (current) use of anticoagulants: Secondary | ICD-10-CM

## 2019-06-02 DIAGNOSIS — I480 Paroxysmal atrial fibrillation: Secondary | ICD-10-CM

## 2019-06-02 LAB — POCT INR: INR: 1.6 — AB (ref 2.0–3.0)

## 2019-06-03 DIAGNOSIS — Z992 Dependence on renal dialysis: Secondary | ICD-10-CM | POA: Diagnosis not present

## 2019-06-03 DIAGNOSIS — N186 End stage renal disease: Secondary | ICD-10-CM | POA: Diagnosis not present

## 2019-06-03 DIAGNOSIS — N2581 Secondary hyperparathyroidism of renal origin: Secondary | ICD-10-CM | POA: Diagnosis not present

## 2019-06-03 NOTE — Progress Notes (Signed)
Current maintenance plan:  7.5 mg every Tue, Thu; 5 mg all other days [x] No changeStart Over  Maintenance plan weekly total: 40 mg Tablets on hand: 7.5 mg & 5 mg  Total dose from past 7 days: 40 mg    INR is low today, but is generally well controlled. Will repeat in 10 days for continued surveillance and make changes if needed at that time.

## 2019-06-05 ENCOUNTER — Other Ambulatory Visit: Payer: Self-pay | Admitting: Cardiology

## 2019-06-05 ENCOUNTER — Other Ambulatory Visit: Payer: Self-pay | Admitting: Family Medicine

## 2019-06-05 DIAGNOSIS — K5900 Constipation, unspecified: Secondary | ICD-10-CM

## 2019-06-05 DIAGNOSIS — I951 Orthostatic hypotension: Secondary | ICD-10-CM

## 2019-06-06 DIAGNOSIS — N186 End stage renal disease: Secondary | ICD-10-CM | POA: Diagnosis not present

## 2019-06-06 DIAGNOSIS — N2581 Secondary hyperparathyroidism of renal origin: Secondary | ICD-10-CM | POA: Diagnosis not present

## 2019-06-06 DIAGNOSIS — Z992 Dependence on renal dialysis: Secondary | ICD-10-CM | POA: Diagnosis not present

## 2019-06-08 DIAGNOSIS — N186 End stage renal disease: Secondary | ICD-10-CM | POA: Diagnosis not present

## 2019-06-08 DIAGNOSIS — N2581 Secondary hyperparathyroidism of renal origin: Secondary | ICD-10-CM | POA: Diagnosis not present

## 2019-06-08 DIAGNOSIS — Z992 Dependence on renal dialysis: Secondary | ICD-10-CM | POA: Diagnosis not present

## 2019-06-10 DIAGNOSIS — Z992 Dependence on renal dialysis: Secondary | ICD-10-CM | POA: Diagnosis not present

## 2019-06-10 DIAGNOSIS — N2581 Secondary hyperparathyroidism of renal origin: Secondary | ICD-10-CM | POA: Diagnosis not present

## 2019-06-10 DIAGNOSIS — N186 End stage renal disease: Secondary | ICD-10-CM | POA: Diagnosis not present

## 2019-06-12 DIAGNOSIS — N2581 Secondary hyperparathyroidism of renal origin: Secondary | ICD-10-CM | POA: Diagnosis not present

## 2019-06-12 DIAGNOSIS — Z992 Dependence on renal dialysis: Secondary | ICD-10-CM | POA: Diagnosis not present

## 2019-06-12 DIAGNOSIS — N186 End stage renal disease: Secondary | ICD-10-CM | POA: Diagnosis not present

## 2019-06-14 ENCOUNTER — Ambulatory Visit: Payer: Medicare Other | Admitting: Cardiology

## 2019-06-14 DIAGNOSIS — N2581 Secondary hyperparathyroidism of renal origin: Secondary | ICD-10-CM | POA: Diagnosis not present

## 2019-06-14 DIAGNOSIS — Z992 Dependence on renal dialysis: Secondary | ICD-10-CM | POA: Diagnosis not present

## 2019-06-14 DIAGNOSIS — N186 End stage renal disease: Secondary | ICD-10-CM | POA: Diagnosis not present

## 2019-06-14 DIAGNOSIS — D689 Coagulation defect, unspecified: Secondary | ICD-10-CM | POA: Diagnosis not present

## 2019-06-14 DIAGNOSIS — Z5329 Procedure and treatment not carried out because of patient's decision for other reasons: Secondary | ICD-10-CM

## 2019-06-17 DIAGNOSIS — N2581 Secondary hyperparathyroidism of renal origin: Secondary | ICD-10-CM | POA: Diagnosis not present

## 2019-06-17 DIAGNOSIS — N186 End stage renal disease: Secondary | ICD-10-CM | POA: Diagnosis not present

## 2019-06-17 DIAGNOSIS — Z992 Dependence on renal dialysis: Secondary | ICD-10-CM | POA: Diagnosis not present

## 2019-06-20 DIAGNOSIS — N186 End stage renal disease: Secondary | ICD-10-CM | POA: Diagnosis not present

## 2019-06-20 DIAGNOSIS — Z992 Dependence on renal dialysis: Secondary | ICD-10-CM | POA: Diagnosis not present

## 2019-06-20 DIAGNOSIS — N2581 Secondary hyperparathyroidism of renal origin: Secondary | ICD-10-CM | POA: Diagnosis not present

## 2019-06-23 ENCOUNTER — Ambulatory Visit (INDEPENDENT_AMBULATORY_CARE_PROVIDER_SITE_OTHER): Payer: Medicare Other | Admitting: Cardiology

## 2019-06-23 ENCOUNTER — Other Ambulatory Visit: Payer: Self-pay

## 2019-06-23 DIAGNOSIS — Z5181 Encounter for therapeutic drug level monitoring: Secondary | ICD-10-CM | POA: Diagnosis not present

## 2019-06-23 DIAGNOSIS — I480 Paroxysmal atrial fibrillation: Secondary | ICD-10-CM | POA: Diagnosis not present

## 2019-06-23 DIAGNOSIS — Z7901 Long term (current) use of anticoagulants: Secondary | ICD-10-CM | POA: Diagnosis not present

## 2019-06-23 LAB — POCT INR: INR: 2 (ref 2.0–3.0)

## 2019-06-23 NOTE — Progress Notes (Signed)
Current maintenance plan:  7.5 mg every Tue, Thu; 5 mg all other days [] No changeStart Over  Maintenance plan weekly total: 40 mg Tablets on hand: 7.5 mg & 5 mg  Total dose from past 7 days: 40 mg    Keep same return 1 month; 2.0 - 3.0 for Afib INR within range, will continue with current dose. Will repeat INR in 1 month for follow up.

## 2019-06-23 NOTE — Addendum Note (Signed)
Addended by: Gwinda Maine on: 06/23/2019 08:53 AM   Modules accepted: Level of Service

## 2019-06-27 DIAGNOSIS — D689 Coagulation defect, unspecified: Secondary | ICD-10-CM | POA: Diagnosis not present

## 2019-07-04 ENCOUNTER — Other Ambulatory Visit: Payer: Self-pay | Admitting: Family Medicine

## 2019-07-04 DIAGNOSIS — I480 Paroxysmal atrial fibrillation: Secondary | ICD-10-CM

## 2019-07-18 ENCOUNTER — Other Ambulatory Visit: Payer: Self-pay | Admitting: Family Medicine

## 2019-07-18 DIAGNOSIS — K5900 Constipation, unspecified: Secondary | ICD-10-CM

## 2019-07-22 DIAGNOSIS — Z992 Dependence on renal dialysis: Secondary | ICD-10-CM | POA: Diagnosis not present

## 2019-07-22 DIAGNOSIS — I158 Other secondary hypertension: Secondary | ICD-10-CM | POA: Diagnosis not present

## 2019-07-22 DIAGNOSIS — N186 End stage renal disease: Secondary | ICD-10-CM | POA: Diagnosis not present

## 2019-07-23 DIAGNOSIS — D631 Anemia in chronic kidney disease: Secondary | ICD-10-CM | POA: Diagnosis not present

## 2019-07-23 DIAGNOSIS — Z992 Dependence on renal dialysis: Secondary | ICD-10-CM | POA: Diagnosis not present

## 2019-07-23 DIAGNOSIS — N2581 Secondary hyperparathyroidism of renal origin: Secondary | ICD-10-CM | POA: Diagnosis not present

## 2019-07-23 DIAGNOSIS — N186 End stage renal disease: Secondary | ICD-10-CM | POA: Diagnosis not present

## 2019-07-25 DIAGNOSIS — Z992 Dependence on renal dialysis: Secondary | ICD-10-CM | POA: Diagnosis not present

## 2019-07-25 DIAGNOSIS — D631 Anemia in chronic kidney disease: Secondary | ICD-10-CM | POA: Diagnosis not present

## 2019-07-25 DIAGNOSIS — N2581 Secondary hyperparathyroidism of renal origin: Secondary | ICD-10-CM | POA: Diagnosis not present

## 2019-07-25 DIAGNOSIS — D689 Coagulation defect, unspecified: Secondary | ICD-10-CM | POA: Diagnosis not present

## 2019-07-25 DIAGNOSIS — N186 End stage renal disease: Secondary | ICD-10-CM | POA: Diagnosis not present

## 2019-07-26 ENCOUNTER — Other Ambulatory Visit: Payer: Self-pay

## 2019-07-26 ENCOUNTER — Other Ambulatory Visit: Payer: Self-pay | Admitting: Family Medicine

## 2019-07-26 ENCOUNTER — Ambulatory Visit (INDEPENDENT_AMBULATORY_CARE_PROVIDER_SITE_OTHER): Payer: Medicare Other | Admitting: Cardiology

## 2019-07-26 DIAGNOSIS — Z7901 Long term (current) use of anticoagulants: Secondary | ICD-10-CM

## 2019-07-26 DIAGNOSIS — I480 Paroxysmal atrial fibrillation: Secondary | ICD-10-CM

## 2019-07-26 DIAGNOSIS — Z5181 Encounter for therapeutic drug level monitoring: Secondary | ICD-10-CM

## 2019-07-26 DIAGNOSIS — K5900 Constipation, unspecified: Secondary | ICD-10-CM

## 2019-07-27 DIAGNOSIS — N186 End stage renal disease: Secondary | ICD-10-CM | POA: Diagnosis not present

## 2019-07-27 DIAGNOSIS — Z992 Dependence on renal dialysis: Secondary | ICD-10-CM | POA: Diagnosis not present

## 2019-07-27 DIAGNOSIS — N2581 Secondary hyperparathyroidism of renal origin: Secondary | ICD-10-CM | POA: Diagnosis not present

## 2019-07-27 DIAGNOSIS — D631 Anemia in chronic kidney disease: Secondary | ICD-10-CM | POA: Diagnosis not present

## 2019-07-28 ENCOUNTER — Ambulatory Visit (INDEPENDENT_AMBULATORY_CARE_PROVIDER_SITE_OTHER): Payer: Medicare Other

## 2019-07-28 VITALS — BP 150/74 | HR 64 | Ht 65.0 in | Wt 240.0 lb

## 2019-07-28 DIAGNOSIS — Z0000 Encounter for general adult medical examination without abnormal findings: Secondary | ICD-10-CM

## 2019-07-28 NOTE — Patient Instructions (Signed)
Jane [name] , Thank you for taking time to participate in your Medicare Wellness Visit. I appreciate your ongoing commitment to your health goals. Please review the following plan we discussed and let me know if I can assist you in the future.   Screening recommendations/referrals: Colorectal Screening: last colonoscopy was 10/08/2015; due 10/05/2018. Past due. Please schedule as soon as you feel safe to do so.  Mammogram: last one 05/14/2018; past due; please schedule as soon as you feel safe to do so. Bone Density: last one was in 2017; past due; please schedule when you feel safe to do so.  Vision and Dental Exams: Recommended annual ophthalmology exams for early detection of glaucoma and other disorders of the eye Recommended annual dental exams for proper oral hygiene. Patient expressed financial struggle with these. Referral made to community care manager to help with cost of dentist and cost of new glasses.  Diabetic Exams: Diabetic Eye Exam: N/A Diabetic Foot Exam: N/A  Vaccinations: Influenza vaccine: performed 04/08/2019; due in Fall 2021. Pneumococcal vaccine: completed 10/12/2012 & 07/20/2015. Currently up to date.  Tdap vaccine: Completed 06/20/2015; repeat 06/19/2025. Up to date at this time. Shingles vaccine: patient declines at this time due to cost of vaccine.  Advanced directives: Advance directives discussed with you today. Please bring a copy of your POA (Power of [place]) and/or Living Will to your next appointment.  Goals: Drink as much water as you can and continue to follow diet instructed by dialysis. Continue to give your best efforts to consume proteins!  Recommend to remove any items from the home that may cause slips or trips.  Next appointment: Please schedule your Annual Wellness Visit with your Nurse Health Advisor in one year.  Preventive Care 71 Years and Older, Female Preventive care refers to lifestyle choices and visits with your health care provider  that can promote health and wellness. What does preventive care include?  A yearly physical exam. This is also called an annual well check.  Dental exams once or twice a year.  Routine eye exams. Ask your health care provider how often you should have your eyes checked.  Personal lifestyle choices, including:  Daily care of your teeth and gums.  Regular physical activity.  Eating a healthy diet.  Avoiding tobacco and drug use.  Limiting alcohol use.  Practicing safe sex.  Taking low-dose aspirin every day if recommended by your health care provider.  Taking vitamin and mineral supplements as recommended by your health care provider. What happens during an annual well check? The services and screenings done by your health care provider during your annual well check will depend on your age, overall health, lifestyle risk factors, and family history of disease. Counseling  Your health care provider may ask you questions about your:  Alcohol use.  Tobacco use.  Drug use.  Emotional well-being.  Home and relationship well-being.  Sexual activity.  Eating habits.  History of falls.  Memory and ability to understand (cognition).  Work and work Statistician.  Reproductive health. Screening  You may have the following tests or measurements:  Height, weight, and BMI.  Blood pressure.  Lipid and cholesterol levels. These may be checked every 5 years, or more frequently if you are over 11 years old.  Skin check.  Lung cancer screening. You may have this screening every year starting at age 48 if you have a 30-pack-year history of smoking and currently smoke or have quit within the past 15 years.  Fecal occult blood  test (FOBT) of the stool. You may have this test every year starting at age 60.  Flexible sigmoidoscopy or colonoscopy. You may have a sigmoidoscopy every 5 years or a colonoscopy every 10 years starting at age 63.  Hepatitis C blood  test.  Hepatitis B blood test.  Sexually transmitted disease (STD) testing.  Diabetes screening. This is done by checking your blood sugar (glucose) after you have not eaten for a while (fasting). You may have this done every 1-3 years.  Bone density scan. This is done to screen for osteoporosis. You may have this done starting at age 37.  Mammogram. This may be done every 1-2 years. Talk to your health care provider about how often you should have regular mammograms. Talk with your health care provider about your test results, treatment options, and if necessary, the need for more tests. Vaccines  Your health care provider may recommend certain vaccines, such as:  Influenza vaccine. This is recommended every year.  Tetanus, diphtheria, and acellular pertussis (Tdap, Td) vaccine. You may need a Td booster every 10 years.  Zoster vaccine. You may need this after age 79.  Pneumococcal 13-valent conjugate (PCV13) vaccine. One dose is recommended after age 48.  Pneumococcal polysaccharide (PPSV23) vaccine. One dose is recommended after age 68. Talk to your health care provider about which screenings and vaccines you need and how often you need them. This information is not intended to replace advice given to you by your health care provider. Make sure you discuss any questions you have with your health care provider. Document Released: 08/03/2015 Document Revised: 03/26/2016 Document Reviewed: 05/08/2015 Elsevier Interactive Patient Education  2017 [place] Prevention in the Home Falls can cause injuries. They can happen to people of all ages. There are many things you can do to make your home safe and to help prevent falls. What can I do on the outside of my home?  Regularly fix the edges of walkways and driveways and fix any cracks.  Remove anything that might make you trip as you walk through a door, such as a raised step or threshold.  Trim any bushes or trees on the  path to your home.  Use bright outdoor lighting.  Clear any walking paths of anything that might make someone trip, such as rocks or tools.  Regularly check to see if handrails are loose or broken. Make sure that both sides of any steps have handrails.  Any raised decks and porches should have guardrails on the edges.  Have any leaves, snow, or ice cleared regularly.  Use sand or salt on walking paths during winter.  Clean up any spills in your garage right away. This includes oil or grease spills. What can I do in the bathroom?  Use night lights.  Install grab bars by the toilet and in the tub and shower. Do not use towel bars as grab bars.  Use non-skid mats or decals in the tub or shower.  If you need to sit down in the shower, use a plastic, non-slip stool.  Keep the floor dry. Clean up any water that spills on the floor as soon as it happens.  Remove soap buildup in the tub or shower regularly.  Attach bath mats securely with double-sided non-slip rug tape.  Do not have throw rugs and other things on the floor that can make you trip. What can I do in the bedroom?  Use night lights.  Make sure that you have a  light by your bed that is easy to reach.  Do not use any sheets or blankets that are too big for your bed. They should not hang down onto the floor.  Have a firm chair that has side arms. You can use this for support while you get dressed.  Do not have throw rugs and other things on the floor that can make you trip. What can I do in the kitchen?  Clean up any spills right away.  Avoid walking on wet floors.  Keep items that you use a lot in easy-to-reach places.  If you need to reach something above you, use a strong step stool that has a grab bar.  Keep electrical cords out of the way.  Do not use floor polish or wax that makes floors slippery. If you must use wax, use non-skid floor wax.  Do not have throw rugs and other things on the floor that can  make you trip. What can I do with my stairs?  Do not leave any items on the stairs.  Make sure that there are handrails on both sides of the stairs and use them. Fix handrails that are broken or loose. Make sure that handrails are as long as the stairways.  Check any carpeting to make sure that it is firmly attached to the stairs. Fix any carpet that is loose or worn.  Avoid having throw rugs at the top or bottom of the stairs. If you do have throw rugs, attach them to the floor with carpet tape.  Make sure that you have a light switch at the top of the stairs and the bottom of the stairs. If you do not have them, ask someone to add them for you. What else can I do to help prevent falls?  Wear shoes that:  Do not have high heels.  Have rubber bottoms.  Are comfortable and fit you well.  Are closed at the toe. Do not wear sandals.  If you use a stepladder:  Make sure that it is fully opened. Do not climb a closed stepladder.  Make sure that both sides of the stepladder are locked into place.  Ask someone to hold it for you, if possible.  Clearly mark and make sure that you can see:  Any grab bars or handrails.  First and last steps.  Where the edge of each step is.  Use tools that help you move around (mobility aids) if they are needed. These include:  Canes.  Walkers.  Scooters.  Crutches.  Turn on the lights when you go into a dark area. Replace any light bulbs as soon as they burn out.  Set up your furniture so you have a clear path. Avoid moving your furniture around.  If any of your floors are uneven, fix them.  If there are any pets around you, be aware of where they are.  Review your medicines with your doctor. Some medicines can make you feel dizzy. This can increase your chance of falling. Ask your doctor what other things that you can do to help prevent falls. This information is not intended to replace advice given to you by your health care  provider. Make sure you discuss any questions you have with your health care provider. Document Released: 05/03/2009 Document Revised: 12/13/2015 Document Reviewed: 08/11/2014 Elsevier Interactive Patient Education  2017 [employer].

## 2019-07-28 NOTE — Progress Notes (Signed)
This visit is being conducted via phone call due to the COVID-19 pandemic. This patient has given me verbal consent via phone to conduct this visit, patient states they are participating from their home address. Some vital signs may be absent or patient reported.   Patient identification: identified by name, DOB, and current address.  Location provider: [facility], Office Persons participating in the virtual visit: Ms. Annakate Tuminello and Franne Forts, LPN.    Subjective:   Jane [name] is a 74 y.o. female who presents for Medicare Annual (Subsequent) preventive examination.  Ms. Jamison continues to ride bus to dialysis Mon, Wed, Fri. She states she feels terrible for the rest of those days but feels pretty good on non-dialysis days. She shares worry about finances as her rent has increased, cost of medications and groceries have increased, and need for money to pay for transportation to more medical appointments. She lives alone in a 1 bedroom handicapped apartment and often will not lock her deadbolts for fear that she may fall or pass out after dialysis and someone would not be able to get in to help her.  She also states she is unable to go to the dentist or obtain glasses due to financial strain. She mentions that she continues to have chronic vaginal discharge, white scaly bumps on arms/legs and concerns about depression. She has a daughter that lives locally who is very supportive but is an Designer, multimedia and her job is very demanding during the covid pandemic; in addition to concerns about covid exposure and danger to her.  Review of Systems:  Cardiac Risk Factors include: advanced age (>8men, >64 women);sedentary lifestyle;obesity (BMI >30kg/m2)     Objective:     Vitals: BP (!) 150/74 Comment: patient reported  Pulse 64   Ht 5\' 5"  (1.651 m)   Wt 240 lb (108.9 kg)   BMI 39.94 kg/m   Body mass index is 39.94 kg/m.  Advanced Directives 07/28/2019 09/17/2017 05/15/2017 10/16/2016 08/04/2016  06/25/2016 07/01/2012  Does Patient Have a Medical Advance Directive? Yes Yes Yes No No No Patient does not have advance directive  Type of Advance Directive Living will;Healthcare Power of [place];Living will [employer];Living will - - - -  Does patient want to make changes to medical advance directive? No - Patient declined No - Patient declined - - - - -  Copy of [place] in Chart? No - copy requested Yes No - copy requested - - - -  Would patient like information on creating a medical advance directive? - - - - - No - Patient declined -  Pre-existing out of facility DNR order (yellow form or pink MOST form) - - - - - - No    Tobacco Social History   Tobacco Use  Smoking Status Never Smoker  Smokeless Tobacco Never Used     Counseling given: Not Answered   Clinical Intake:  Pre-visit preparation completed: Yes  Pain : 0-10 Pain Score: 8  Pain Type: Chronic pain Pain Location: Back(and right knee) Pain Descriptors / Indicators: Throbbing     BMI - recorded: 39.94 Nutritional Status: BMI > 30  Obese Nutritional Risks: None Diabetes: No  How often do you need to have someone help you when you read instructions, pamphlets, or other written materials from your doctor or pharmacy?: 1 - Never What is the last grade level you completed in school?: nursing school in [place]?: No  Information entered by :: Franne Forts, LPN.  Past Medical History:  Diagnosis Date  . Acid reflux   . Atrial fibrillation ([place])   . Bilateral carotid bruits   . Hypertension   . Malaise and fatigue   . Orthostatic hypotension   . Renal disorder   . Shortness of breath   . Sleep apnea   . Syncope    Past Surgical History:  Procedure Laterality Date  . BACK SURGERY    . colon removal  06/2016  . IR GENERIC HISTORICAL  07/09/2016   IR US GUIDE VASC ACCESS RIGHT 07/09/2016 Arne Cleveland, MD [facility]   . IR GENERIC HISTORICAL  07/09/2016   IR FLUORO GUIDE CV LINE RIGHT 07/09/2016 Arne Cleveland, MD [facility]  . KNEE ARTHROPLASTY    . LAPAROSCOPIC SIGMOID COLECTOMY N/A 07/11/2016   Procedure: LAPAROSCOPIC SIGMOID COLECTOMY;  Surgeon: Clovis Riley, MD;  Location: [place];  Service: General;  Laterality: N/A;  . TUBAL LIGATION     Family History  Problem Relation Age of Onset  . Heart failure Mother   . Stroke Mother   . Other Father    Social History   Socioeconomic History  . Marital status: Divorced    Spouse name: Not on file  . Number of children: 4  . Years of education: 26  . Highest education level: Associate degree: occupational, Hotel manager, or vocational program  Occupational History  . Occupation: Retired  Tobacco Use  . Smoking status: Never Smoker  . Smokeless tobacco: Never Used  Substance and Sexual Activity  . Alcohol use: No  . Drug use: No  . Sexual activity: Never  Other Topics Concern  . Not on file  Social History Narrative  . Not on file   Social Determinants of Health   Financial Resource Strain: High Risk  . Difficulty of Paying Living Expenses: Very hard  Food Insecurity: No Food Insecurity  . Worried About Charity fundraiser in the Last Year: Never true  . Ran Out of Food in the Last Year: Never true  Transportation Needs: No Transportation Needs  . Lack of Transportation (Medical): No  . Lack of Transportation (Non-Medical): No  Physical Activity:   . Days of Exercise per Week: Not on file  . Minutes of Exercise per Session: Not on file  Stress: Stress Concern Present  . Feeling of Stress : Rather much  Social Connections: Slightly Isolated  . Frequency of Communication with Friends and Family: More than three times a week  . Frequency of Social Gatherings with Friends and Family: Once a week  . Attends Religious Services: More than 4 times per year  . Active Member of Clubs or Organizations: Yes  . Attends Archivist  Meetings: Not on file  . Marital Status: Divorced    Outpatient Encounter Medications as of 07/28/2019  Medication Sig  . albuterol (PROVENTIL HFA;VENTOLIN HFA) 108 (90 Base) MCG/ACT inhaler Inhale 1 puff into the lungs every 6 (six) hours as needed for wheezing or shortness of breath.  . AMITIZA 8 MCG capsule TAKE 1 CAPSULE BY MOUTH ONCE DAILY WITH BREAKFAST  . cinacalcet (SENSIPAR) 60 MG tablet Take 60 mg by mouth every other day.  . ethyl chloride spray USE 1 TO 2 SPRAYS TO DIALYSIS ACCESS EVERY TREATMENT  . fludrocortisone (FLORINEF) 0.1 MG tablet TAKE 1 TABLET BY MOUTH EVERY OTHER DAY  . LINZESS 145 MCG CAPS capsule TAKE 1 CAPSULE BY MOUTH ONCE DAILY BEFORE BREAKFAST  .  midodrine (PROAMATINE) 10 MG tablet Take 10 mg by mouth 3 (three) times daily.  . multivitamin (RENA-VIT) TABS tablet Take 1 tablet by mouth daily.  Marland Kitchen omeprazole (PRILOSEC) 20 MG capsule TAKE 1 CAPSULE BY MOUTH ONCE DAILY BEFORE BREAKFAST  . sevelamer carbonate (RENVELA) 800 MG tablet Take 800-1,600 mg by mouth See admin instructions. 2 tabs 3 times daily  . terconazole (TERAZOL 7) 0.4 % vaginal cream Place 1 applicator vaginally at bedtime.  Marland Kitchen warfarin (COUMADIN) 5 MG tablet TAKE 1 TABLET BY MOUTH DAILY AT 6PM. TAKE 1 AND 1/2 TABLETS ON TUESDAY AND THURSDAY, 5MG  ON ALL OTHER DAYS  . Amino Acids-Protein Hydrolys (FEEDING SUPPLEMENT, PRO-STAT SUGAR FREE 64,) LIQD Take 30 mLs by mouth 2 (two) times daily. (Patient not taking: Reported on 04/12/2019)  . b complex vitamins capsule Take by mouth.  . carvedilol (COREG) 12.5 MG tablet Take 1 tablet (12.5 mg total) by mouth as needed (if BP is above 170).   No facility-administered encounter medications on file as of 07/28/2019.    Activities of Daily Living In your present state of health, do you have any difficulty performing the following activities: 07/28/2019  Hearing? N  Vision? Y  Difficulty concentrating or making decisions? N  Walking or climbing stairs? Y  Dressing or  bathing? N  Doing errands, shopping? Y  Preparing Food and eating ? N  Using the Toilet? N  In the past six months, have you accidently leaked urine? N  Do you have problems with loss of bowel control? N  Managing your Medications? N  Managing your Finances? N  Housekeeping or managing your Housekeeping? Y  Some recent data might be hidden    Patient Care Team: [name], [name], MD as PCP - General (Family Medicine) Sidonie Dickens, MD as Referring Physician (Psychiatry) Center, [facility]    Assessment:   This is a routine wellness examination for Annelies.  Exercise Activities and Dietary recommendations Current Exercise Habits: The patient does not participate in regular exercise at present, Exercise limited by: Other - see comments(dialysis patient)  Goals    . connect with resources     Needs resources for assistance with cost of transportation, increase in costs of medications, increase in groceries for high protein diet, increase in rent, and need for shower chair. Referral to community care manager.       Fall Risk Fall Risk  07/28/2019 06/09/2018 05/11/2018 10/16/2016  Falls in the past year? 0 1 Yes Yes  Comment - Emmi Telephone Survey: data to providers prior to load - post dialysis   Number falls in past yr: - 1 2 or more 1  Comment - Emmi Telephone Survey Actual Response = 4 - -  Injury with Fall? - 0 No -  Risk for fall due to : Medication side effect;Other (Comment);Impaired mobility;Impaired vision - Impaired balance/gait;Impaired vision;History of fall(s) -  Risk for fall due to: Comment dialysis patient - - -  Follow up Falls evaluation completed;Education provided;Falls prevention discussed - Falls evaluation completed;Falls prevention discussed Education provided   Is the patient's home free of loose throw rugs in walkways, pet beds, electrical cords, etc?   yes      Grab bars in the bathroom? yes      Handrails on the stairs?   yes       Adequate lighting?   yes  Timed Get Up and Go performed: N/A due to telephone visit.  Depression Screen PHQ 2/9 Scores 07/28/2019 09/25/2018  05/11/2018 01/26/2018  PHQ - 2 Score 3 2 0 0  PHQ- 9 Score 9 13 - -     Cognitive Function MMSE - Mini Mental State Exam 10/16/2016  Not completed: (No Data)     6CIT Screen 07/28/2019  What Year? 0 points  What month? 0 points  What time? 0 points  Count back from 20 0 points  Months in reverse 0 points  Repeat phrase 0 points  Total Score 0    Immunization History  Administered Date(s) Administered  . Influenza, High Dose Seasonal PF 04/08/2019  . Influenza,inj,quad, With Preservative 04/12/2018  . Influenza-Unspecified 04/20/2016, 05/21/2018  . Pneumococcal Conjugate-13 07/20/2015  . Pneumococcal Polysaccharide-23 10/12/2012  . Tdap 06/20/2015    Qualifies for Shingles Vaccine?Yes but declines due to out of pocket cost.  Screening Tests Health Maintenance  Topic Date Due  . URINE MICROALBUMIN  06/12/1956  . COLONOSCOPY  10/08/2018  . TETANUS/TDAP  06/19/2025  . INFLUENZA VACCINE  Completed  . DEXA SCAN  Completed  . PNA vac Low Risk Adult  Completed  . Hepatitis C Screening  Discontinued    Cancer Screenings: Lung: Low Dose CT Chest recommended if Age 72-80 years, 30 pack-year currently smoking OR have quit w/in 15years. Patient does not qualify. Breast:  Up to date on Mammogram? No   Up to date of Bone Density/Dexa? No Colorectal: no  Additional Screenings:  Hepatitis C Screening: records show this was completed at [facility].      Plan:   Telephone appointment was scheduled with Dr. [name] to discuss depression, chronic vaginal discharge, and white scaly bumps on arms/legs. Referral placed to [place] to help locate resources for a Life Line device since patient lives alone and has outpatient dialysis 3 week, need for shower chair, financial need for dental appointment, financial need for eye glasses, assistance  with grocery money (needs high protein diet due to dialysis), and possibly locating a new apartment that is more affordable.   Ms. Vandenheuvel understands she is past due for a colonoscopy, mammogram, and DEXA scan but wants to wait until pandemic settles and it is safer for her to do these.   I have personally reviewed and noted the following in the patient's chart:   . Medical and social history . Use of alcohol, tobacco or illicit drugs  . Current medications and supplements . Functional ability and status . Nutritional status . Physical activity . Advanced directives . List of other physicians . Hospitalizations, surgeries, and ER visits in previous 12 months . Vitals . Screenings to include cognitive, depression, and falls . Referrals and appointments  In addition, I have reviewed and discussed with patient certain preventive protocols, quality metrics, and best practice recommendations. A written personalized care plan for preventive services as well as general preventive health recommendations were provided to patient.     Franne Forts, LPN  7/0/4888

## 2019-07-29 DIAGNOSIS — Z992 Dependence on renal dialysis: Secondary | ICD-10-CM | POA: Diagnosis not present

## 2019-07-29 DIAGNOSIS — N2581 Secondary hyperparathyroidism of renal origin: Secondary | ICD-10-CM | POA: Diagnosis not present

## 2019-07-29 DIAGNOSIS — D631 Anemia in chronic kidney disease: Secondary | ICD-10-CM | POA: Diagnosis not present

## 2019-07-29 DIAGNOSIS — N186 End stage renal disease: Secondary | ICD-10-CM | POA: Diagnosis not present

## 2019-07-29 LAB — POCT INR: INR: 2.5 (ref 2.0–3.0)

## 2019-07-29 NOTE — Progress Notes (Signed)
Current maintenance plan:  7.5 mg every Tue, Thu; 5 mg all other days [] No changeStart Over  Maintenance plan weekly total: 40 mg Tablets on hand: 7.5 mg & 5 mg  Total dose from past 7 days: 40 mg    INR is within range of 2-3 for A fib. Will continue with current regimen. Will repeat in 1 month for continued surveillance.

## 2019-08-01 DIAGNOSIS — N186 End stage renal disease: Secondary | ICD-10-CM | POA: Diagnosis not present

## 2019-08-01 DIAGNOSIS — Z992 Dependence on renal dialysis: Secondary | ICD-10-CM | POA: Diagnosis not present

## 2019-08-01 DIAGNOSIS — D631 Anemia in chronic kidney disease: Secondary | ICD-10-CM | POA: Diagnosis not present

## 2019-08-01 DIAGNOSIS — N2581 Secondary hyperparathyroidism of renal origin: Secondary | ICD-10-CM | POA: Diagnosis not present

## 2019-08-02 ENCOUNTER — Encounter: Payer: Self-pay | Admitting: Family Medicine

## 2019-08-02 ENCOUNTER — Telehealth (INDEPENDENT_AMBULATORY_CARE_PROVIDER_SITE_OTHER): Payer: Medicare Other | Admitting: Family Medicine

## 2019-08-02 DIAGNOSIS — Z992 Dependence on renal dialysis: Secondary | ICD-10-CM

## 2019-08-02 DIAGNOSIS — N760 Acute vaginitis: Secondary | ICD-10-CM | POA: Diagnosis not present

## 2019-08-02 DIAGNOSIS — N186 End stage renal disease: Secondary | ICD-10-CM | POA: Diagnosis not present

## 2019-08-02 DIAGNOSIS — F320 Major depressive disorder, single episode, mild: Secondary | ICD-10-CM

## 2019-08-02 DIAGNOSIS — L304 Erythema intertrigo: Secondary | ICD-10-CM

## 2019-08-02 DIAGNOSIS — G4700 Insomnia, unspecified: Secondary | ICD-10-CM | POA: Diagnosis not present

## 2019-08-02 DIAGNOSIS — K5900 Constipation, unspecified: Secondary | ICD-10-CM | POA: Diagnosis not present

## 2019-08-02 MED ORDER — FLUCONAZOLE 150 MG PO TABS: 150.0000 mg | ORAL_TABLET | ORAL | 0 refills | Status: AC

## 2019-08-02 MED ORDER — MIRTAZAPINE 15 MG PO TABS: 7.5000 mg | ORAL_TABLET | Freq: Every day | ORAL | 0 refills | Status: DC

## 2019-08-02 MED ORDER — NYSTATIN-TRIAMCINOLONE 100000-0.1 UNIT/GM-% EX CREA: 1.0000 "application " | TOPICAL_CREAM | Freq: Two times a day (BID) | CUTANEOUS | 0 refills | Status: DC | PRN

## 2019-08-02 NOTE — Progress Notes (Signed)
Virtual Visit via Telephone Note  I connected with Jane [name] on 08/02/19 at  8:00 AM EST by telephone and verified that I am speaking with the correct person using two identifiers.   I discussed the limitations, risks, security and privacy concerns of performing an evaluation and management service by telephone and the availability of in person appointments. I also discussed with the patient that there may be a patient responsible charge related to this service. The patient expressed understanding and agreed to proceed.  Location patient: home Location provider: work office Participants present for the call: patient, provider Patient did not have a visit in the prior 7 days to address this/these issue(s).   History of Present Illness: Jane [name] is a 74 yo female with hx of ESRD on dialysis,HTN,atrial fib on chronic anticoagulation, and obesity c/o worsening anxiety. "I am scare" Positive screening test during [facility].  Depression screen [facility] 2/9 07/28/2019 09/25/2018 05/11/2018 01/26/2018 10/16/2016  Decreased Interest 1 1 0 0 0  Down, Depressed, Hopeless 2 1 0 0 0  PHQ - 2 Score 3 2 0 0 0  Altered sleeping 2 3 - - -  Tired, decreased energy 2 3 - - -  Change in appetite 2 3 - - -  Feeling bad or failure about yourself  0 0 - - -  Trouble concentrating 0 1 - - -  Moving slowly or fidgety/restless 0 0 - - -  Suicidal thoughts 0 1 - - -  PHQ-9 Score 9 13 - - -  Difficult doing work/chores Somewhat difficult Somewhat difficult - - -    She is "tired of restrictions" related with COVID 19 pandemia She tries to exercise but aggravates knee pain,walking 2 times per week.  She is not sleeping well, she feels like if she can sleep she will feel better.  +Fatigue. She is talking with her family daily.  Knee OA,Tylenol does not help with knee pain. No erythema or edema. Alleviated by rest.  She also is c/o feeling sick after dialysis. It worsens fatigue,nauseated,and causes headaches.It is worse  when she has > 3 hours sections. Reports that her BP drops after dialysis despite the fact she is holding bp meds days of dialysis and taking Midodrine 10 mg tid. Dialysis makes her sick,tired and headache. BP drops after, Medridon is not helping.  "Cannot get rid of this yeast infection", she has had" heavy" vaginal discharge and pruritus around pubic area.She has seen red "bumps", not tender, She has had problem intermittently for a while.  She took Diflucan before, which helped.  Also c/o constipation, Linzess takes 1-2 days to work, she does not take medication daily but rather prn. Last bowel movement yesterday,loose due to med.  Observations/Objective: Patient sounds cheerful and well on the phone. I do not appreciate any SOB. Speech and thought processing are grossly intact. Patient reported vitals:There were no vitals taken for this visit.   Assessment and Plan:  1. Vulvovaginitis Some side effects of Diflucan discussed, recommended x 2 weeks, take dose after dialysis. May need pelvic exam if problem does not resolve.  - fluconazole (DIFLUCAN) 150 MG tablet; Take 1 tablet (150 mg total) by mouth once a week for 2 doses.  Dispense: 2 tablet; Refill: 0  2. Intertrigo Educated about dx,prognosis,and treatment options. Topical treatment recommended. Some side effects of prolonged topical steroid discussed.  - fluconazole (DIFLUCAN) 150 MG tablet; Take 1 tablet (150 mg total) by mouth once a week for 2 doses.  Dispense:  2 tablet; Refill: 0 - nystatin-triamcinolone (MYCOLOG II) cream; Apply 1 application topically 2 (two) times daily as needed.  Dispense: 45 g; Refill: 0  3. ESRD on hemodialysis [facility]) Recommend discussing with her nephrologist symptoms she is having after dialysis, dry wt may need to be readjusted. She needs to keep a high protein diet.  4. Depression, major, single episode, mild ([place]) She agrees with taking Remeron. Some side effects discussed. May also  help with appetite. Recommend starting with Remeron 1/2 tab and increase to a tab in a few days if well tolerated.  - mirtazapine (REMERON) 15 MG tablet; Take 0.5 tablets (7.5 mg total) by mouth at bedtime.  Dispense: 30 tablet; Refill: 0  5. Insomnia, unspecified type Remeron may also help. Good sleep hygiene.  6. Constipation, unspecified constipation type Benefiber twice daily. Continue Linzess for now. Adequate hydration.   We discussed risk of interaction with Diflucan and Coumadin, instructed to call Coumadin clinic and let them know about the new medications, she may need a INR earlier.  Follow Up Instructions:  Return in about 4 weeks (around 08/30/2019).  I did not refer this patient for an OV in the next 24 hours for this/these issue(s).  I discussed the assessment and treatment plan with the patient. Jane [name] was provided an opportunity to ask questions and all were answered. She  agreed with the plan and demonstrated an understanding of the instructions.   The patient was advised to call back or seek an in-person evaluation if the symptoms worsen or if the condition fails to improve as anticipated.  I provided 22-23 minutes of non-face-to-face time during this encounter.   Ciji Boston [name], MD

## 2019-08-03 ENCOUNTER — Telehealth: Payer: Self-pay

## 2019-08-03 DIAGNOSIS — Z992 Dependence on renal dialysis: Secondary | ICD-10-CM | POA: Diagnosis not present

## 2019-08-03 DIAGNOSIS — N2581 Secondary hyperparathyroidism of renal origin: Secondary | ICD-10-CM | POA: Diagnosis not present

## 2019-08-03 DIAGNOSIS — E1129 Type 2 diabetes mellitus with other diabetic kidney complication: Secondary | ICD-10-CM | POA: Diagnosis not present

## 2019-08-03 DIAGNOSIS — N186 End stage renal disease: Secondary | ICD-10-CM | POA: Diagnosis not present

## 2019-08-03 DIAGNOSIS — D631 Anemia in chronic kidney disease: Secondary | ICD-10-CM | POA: Diagnosis not present

## 2019-08-03 NOTE — Telephone Encounter (Signed)
08/03/2019 Spoke with patient about financial assistance for transportation, food and rent. Medicare does not cover shower chair because they are not considered DME. I suggested she try [employer] sometimes they will have used medical equipment.  Ambrose Mantle [phone]

## 2019-08-05 ENCOUNTER — Telehealth: Payer: Self-pay

## 2019-08-05 DIAGNOSIS — N2581 Secondary hyperparathyroidism of renal origin: Secondary | ICD-10-CM | POA: Diagnosis not present

## 2019-08-05 DIAGNOSIS — D631 Anemia in chronic kidney disease: Secondary | ICD-10-CM | POA: Diagnosis not present

## 2019-08-05 DIAGNOSIS — Z992 Dependence on renal dialysis: Secondary | ICD-10-CM | POA: Diagnosis not present

## 2019-08-05 DIAGNOSIS — N186 End stage renal disease: Secondary | ICD-10-CM | POA: Diagnosis not present

## 2019-08-05 NOTE — Telephone Encounter (Signed)
Persistent symptoms that suggest fungal vulvovaginitis. Yes, fluconazole can interact with Coumadin as most of medications do. I instructed Jane [name] to contact Coumadin clinic to let them know about new medication and to arrange INR 3 days after medication was started.  If she cannot have an INR check as recommended or Coumadin clinic provider advises against taking medication, she can continue with topical treatment. Thanks, BJ

## 2019-08-05 NOTE — Telephone Encounter (Signed)
Pharmacy sent fax over saying that Fluconazole can increase the effects of warfarin, they want to know if it's okay to fill or not.

## 2019-08-05 NOTE — Telephone Encounter (Signed)
Fax sent to pharmacy with info below.

## 2019-08-08 DIAGNOSIS — N2581 Secondary hyperparathyroidism of renal origin: Secondary | ICD-10-CM | POA: Diagnosis not present

## 2019-08-08 DIAGNOSIS — N186 End stage renal disease: Secondary | ICD-10-CM | POA: Diagnosis not present

## 2019-08-08 DIAGNOSIS — D631 Anemia in chronic kidney disease: Secondary | ICD-10-CM | POA: Diagnosis not present

## 2019-08-08 DIAGNOSIS — Z992 Dependence on renal dialysis: Secondary | ICD-10-CM | POA: Diagnosis not present

## 2019-08-09 ENCOUNTER — Telehealth: Payer: Self-pay

## 2019-08-09 NOTE — Telephone Encounter (Signed)
Pt's insurance will not cover the nystatin-triamcinolone cream.  They will cover: Clotrimazole-betamethasone 1-0.05% cream,  Ketoconazole External Cream 2 %,  Nystatin 100,000 unit/gm cream/ointment/powder and  Triamcinolone 0.1% cream/lotion/ointment  (medications covered separately and not as a combination product)

## 2019-08-10 DIAGNOSIS — N2581 Secondary hyperparathyroidism of renal origin: Secondary | ICD-10-CM | POA: Diagnosis not present

## 2019-08-10 DIAGNOSIS — D689 Coagulation defect, unspecified: Secondary | ICD-10-CM | POA: Diagnosis not present

## 2019-08-10 DIAGNOSIS — N186 End stage renal disease: Secondary | ICD-10-CM | POA: Diagnosis not present

## 2019-08-10 DIAGNOSIS — D631 Anemia in chronic kidney disease: Secondary | ICD-10-CM | POA: Diagnosis not present

## 2019-08-10 DIAGNOSIS — Z992 Dependence on renal dialysis: Secondary | ICD-10-CM | POA: Diagnosis not present

## 2019-08-12 ENCOUNTER — Other Ambulatory Visit: Payer: Self-pay | Admitting: Family Medicine

## 2019-08-12 DIAGNOSIS — N186 End stage renal disease: Secondary | ICD-10-CM | POA: Diagnosis not present

## 2019-08-12 DIAGNOSIS — L304 Erythema intertrigo: Secondary | ICD-10-CM

## 2019-08-12 DIAGNOSIS — D631 Anemia in chronic kidney disease: Secondary | ICD-10-CM | POA: Diagnosis not present

## 2019-08-12 DIAGNOSIS — Z992 Dependence on renal dialysis: Secondary | ICD-10-CM | POA: Diagnosis not present

## 2019-08-12 DIAGNOSIS — N2581 Secondary hyperparathyroidism of renal origin: Secondary | ICD-10-CM | POA: Diagnosis not present

## 2019-08-12 MED ORDER — NYSTATIN 100000 UNIT/GM EX CREA: 1.0000 "application " | TOPICAL_CREAM | Freq: Two times a day (BID) | CUTANEOUS | 0 refills | Status: DC

## 2019-08-12 MED ORDER — TRIAMCINOLONE ACETONIDE 0.1 % EX CREA: 1.0000 "application " | TOPICAL_CREAM | Freq: Two times a day (BID) | CUTANEOUS | 0 refills | Status: DC

## 2019-08-12 NOTE — Telephone Encounter (Signed)
I sent 2 different prescription (nystatin and triamcinolone), she can combine both creams and apply on affected area. Thanks, BJ

## 2019-08-12 NOTE — Telephone Encounter (Signed)
I called and spoke with pt, she is aware both Rx's were sent in.

## 2019-08-15 DIAGNOSIS — D631 Anemia in chronic kidney disease: Secondary | ICD-10-CM | POA: Diagnosis not present

## 2019-08-15 DIAGNOSIS — N186 End stage renal disease: Secondary | ICD-10-CM | POA: Diagnosis not present

## 2019-08-15 DIAGNOSIS — N2581 Secondary hyperparathyroidism of renal origin: Secondary | ICD-10-CM | POA: Diagnosis not present

## 2019-08-15 DIAGNOSIS — Z992 Dependence on renal dialysis: Secondary | ICD-10-CM | POA: Diagnosis not present

## 2019-08-17 ENCOUNTER — Telehealth: Payer: Self-pay | Admitting: Family Medicine

## 2019-08-17 DIAGNOSIS — N2581 Secondary hyperparathyroidism of renal origin: Secondary | ICD-10-CM | POA: Diagnosis not present

## 2019-08-17 DIAGNOSIS — N186 End stage renal disease: Secondary | ICD-10-CM | POA: Diagnosis not present

## 2019-08-17 DIAGNOSIS — R21 Rash and other nonspecific skin eruption: Secondary | ICD-10-CM

## 2019-08-17 DIAGNOSIS — D631 Anemia in chronic kidney disease: Secondary | ICD-10-CM | POA: Diagnosis not present

## 2019-08-17 DIAGNOSIS — Z992 Dependence on renal dialysis: Secondary | ICD-10-CM | POA: Diagnosis not present

## 2019-08-17 NOTE — Telephone Encounter (Signed)
Pt stated that Dr. [name] had discuss with her a referral to a dermatologist regarding the rash on her arm as well as warts on her R. arm and R. Leg. Pt would like that referral and to be contacted at [phone]

## 2019-08-17 NOTE — Telephone Encounter (Signed)
Okay for referral?

## 2019-08-19 DIAGNOSIS — D631 Anemia in chronic kidney disease: Secondary | ICD-10-CM | POA: Diagnosis not present

## 2019-08-19 DIAGNOSIS — N186 End stage renal disease: Secondary | ICD-10-CM | POA: Diagnosis not present

## 2019-08-19 DIAGNOSIS — N2581 Secondary hyperparathyroidism of renal origin: Secondary | ICD-10-CM | POA: Diagnosis not present

## 2019-08-19 DIAGNOSIS — Z992 Dependence on renal dialysis: Secondary | ICD-10-CM | POA: Diagnosis not present

## 2019-08-19 NOTE — Telephone Encounter (Signed)
It is ok to place dermatology referral as requested. Thanks, BJ

## 2019-08-19 NOTE — Telephone Encounter (Signed)
Referral entered  

## 2019-08-22 ENCOUNTER — Telehealth: Payer: Self-pay

## 2019-08-22 DIAGNOSIS — I158 Other secondary hypertension: Secondary | ICD-10-CM | POA: Diagnosis not present

## 2019-08-22 DIAGNOSIS — N186 End stage renal disease: Secondary | ICD-10-CM | POA: Diagnosis not present

## 2019-08-22 DIAGNOSIS — N2581 Secondary hyperparathyroidism of renal origin: Secondary | ICD-10-CM | POA: Diagnosis not present

## 2019-08-22 DIAGNOSIS — D631 Anemia in chronic kidney disease: Secondary | ICD-10-CM | POA: Diagnosis not present

## 2019-08-22 DIAGNOSIS — Z992 Dependence on renal dialysis: Secondary | ICD-10-CM | POA: Diagnosis not present

## 2019-08-22 NOTE — Telephone Encounter (Signed)
08/22/2019 Spoke with patient. She has been able to contact resources, apply over the phone  and is waiting on a response from them.  Patient does not have any other needs right now.  Most charities are not working fully staffed due to [employer] and it is taking longer to assist clients.  Ambrose Mantle [phone]

## 2019-08-24 DIAGNOSIS — N186 End stage renal disease: Secondary | ICD-10-CM | POA: Diagnosis not present

## 2019-08-24 DIAGNOSIS — D631 Anemia in chronic kidney disease: Secondary | ICD-10-CM | POA: Diagnosis not present

## 2019-08-24 DIAGNOSIS — Z992 Dependence on renal dialysis: Secondary | ICD-10-CM | POA: Diagnosis not present

## 2019-08-24 DIAGNOSIS — N2581 Secondary hyperparathyroidism of renal origin: Secondary | ICD-10-CM | POA: Diagnosis not present

## 2019-08-25 DIAGNOSIS — L821 Other seborrheic keratosis: Secondary | ICD-10-CM | POA: Diagnosis not present

## 2019-08-25 DIAGNOSIS — R208 Other disturbances of skin sensation: Secondary | ICD-10-CM | POA: Diagnosis not present

## 2019-08-25 DIAGNOSIS — L309 Dermatitis, unspecified: Secondary | ICD-10-CM | POA: Diagnosis not present

## 2019-08-25 DIAGNOSIS — L820 Inflamed seborrheic keratosis: Secondary | ICD-10-CM | POA: Diagnosis not present

## 2019-08-26 ENCOUNTER — Other Ambulatory Visit: Payer: Self-pay | Admitting: Cardiology

## 2019-08-26 DIAGNOSIS — I951 Orthostatic hypotension: Secondary | ICD-10-CM

## 2019-08-26 DIAGNOSIS — N2581 Secondary hyperparathyroidism of renal origin: Secondary | ICD-10-CM | POA: Diagnosis not present

## 2019-08-26 DIAGNOSIS — Z992 Dependence on renal dialysis: Secondary | ICD-10-CM | POA: Diagnosis not present

## 2019-08-26 DIAGNOSIS — N186 End stage renal disease: Secondary | ICD-10-CM | POA: Diagnosis not present

## 2019-08-26 DIAGNOSIS — D631 Anemia in chronic kidney disease: Secondary | ICD-10-CM | POA: Diagnosis not present

## 2019-08-29 DIAGNOSIS — Z992 Dependence on renal dialysis: Secondary | ICD-10-CM | POA: Diagnosis not present

## 2019-08-29 DIAGNOSIS — D631 Anemia in chronic kidney disease: Secondary | ICD-10-CM | POA: Diagnosis not present

## 2019-08-29 DIAGNOSIS — N186 End stage renal disease: Secondary | ICD-10-CM | POA: Diagnosis not present

## 2019-08-29 DIAGNOSIS — N2581 Secondary hyperparathyroidism of renal origin: Secondary | ICD-10-CM | POA: Diagnosis not present

## 2019-08-30 ENCOUNTER — Other Ambulatory Visit: Payer: Self-pay

## 2019-08-30 ENCOUNTER — Ambulatory Visit (INDEPENDENT_AMBULATORY_CARE_PROVIDER_SITE_OTHER): Payer: Medicare Other | Admitting: Cardiology

## 2019-08-30 DIAGNOSIS — I480 Paroxysmal atrial fibrillation: Secondary | ICD-10-CM | POA: Diagnosis not present

## 2019-08-30 DIAGNOSIS — Z7901 Long term (current) use of anticoagulants: Secondary | ICD-10-CM

## 2019-08-30 DIAGNOSIS — Z5181 Encounter for therapeutic drug level monitoring: Secondary | ICD-10-CM

## 2019-08-30 LAB — POCT INR: INR: 2.3 (ref 2.0–3.0)

## 2019-08-30 NOTE — Progress Notes (Signed)
Warfarin Anticoagulation Tracking     Current maintenance plan:  7.5 mg every Tue, Thu; 5 mg all other days No change Start Over  Maintenance plan weekly total: 40 mg  Tablets on hand: 7.5 mg & 5 mg  Total dose from past 7 days: 40 mg     INR within range of 2-3. Will continue with current Coumadin regimen. Will repeat in 1 month

## 2019-08-31 DIAGNOSIS — D631 Anemia in chronic kidney disease: Secondary | ICD-10-CM | POA: Diagnosis not present

## 2019-08-31 DIAGNOSIS — Z992 Dependence on renal dialysis: Secondary | ICD-10-CM | POA: Diagnosis not present

## 2019-08-31 DIAGNOSIS — N2581 Secondary hyperparathyroidism of renal origin: Secondary | ICD-10-CM | POA: Diagnosis not present

## 2019-08-31 DIAGNOSIS — N186 End stage renal disease: Secondary | ICD-10-CM | POA: Diagnosis not present

## 2019-09-02 DIAGNOSIS — N2581 Secondary hyperparathyroidism of renal origin: Secondary | ICD-10-CM | POA: Diagnosis not present

## 2019-09-02 DIAGNOSIS — Z992 Dependence on renal dialysis: Secondary | ICD-10-CM | POA: Diagnosis not present

## 2019-09-02 DIAGNOSIS — N186 End stage renal disease: Secondary | ICD-10-CM | POA: Diagnosis not present

## 2019-09-02 DIAGNOSIS — D631 Anemia in chronic kidney disease: Secondary | ICD-10-CM | POA: Diagnosis not present

## 2019-09-03 ENCOUNTER — Ambulatory Visit: Payer: Self-pay

## 2019-09-03 ENCOUNTER — Ambulatory Visit: Payer: Medicare Other | Attending: Internal Medicine

## 2019-09-03 DIAGNOSIS — Z23 Encounter for immunization: Secondary | ICD-10-CM

## 2019-09-03 NOTE — Progress Notes (Signed)
   Covid-19 Vaccination Clinic  Name:  Jane [name]    MRN: [id number] DOB: 07-02-1946  09/03/2019  [name] was observed post Covid-19 immunization for 15 minutes without incidence. She was provided with Vaccine Information Sheet and instruction to access the V-Safe system.   [name] was instructed to call 911 with any severe reactions post vaccine: Marland Kitchen Difficulty breathing  . Swelling of your face and throat  . A fast heartbeat  . A bad rash all over your body  . Dizziness and weakness    Immunizations Administered    Name Date Dose VIS Date Route   Pfizer COVID-19 Vaccine 09/03/2019  9:38 AM 0.3 mL 07/01/2019 Intramuscular   Manufacturer: Iron   Lot: [id number]   Platea: [address]-3

## 2019-09-05 DIAGNOSIS — N2581 Secondary hyperparathyroidism of renal origin: Secondary | ICD-10-CM | POA: Diagnosis not present

## 2019-09-05 DIAGNOSIS — N186 End stage renal disease: Secondary | ICD-10-CM | POA: Diagnosis not present

## 2019-09-05 DIAGNOSIS — Z992 Dependence on renal dialysis: Secondary | ICD-10-CM | POA: Diagnosis not present

## 2019-09-05 DIAGNOSIS — D689 Coagulation defect, unspecified: Secondary | ICD-10-CM | POA: Diagnosis not present

## 2019-09-05 DIAGNOSIS — D631 Anemia in chronic kidney disease: Secondary | ICD-10-CM | POA: Diagnosis not present

## 2019-09-07 DIAGNOSIS — N2581 Secondary hyperparathyroidism of renal origin: Secondary | ICD-10-CM | POA: Diagnosis not present

## 2019-09-07 DIAGNOSIS — D631 Anemia in chronic kidney disease: Secondary | ICD-10-CM | POA: Diagnosis not present

## 2019-09-07 DIAGNOSIS — N186 End stage renal disease: Secondary | ICD-10-CM | POA: Diagnosis not present

## 2019-09-07 DIAGNOSIS — Z992 Dependence on renal dialysis: Secondary | ICD-10-CM | POA: Diagnosis not present

## 2019-09-09 DIAGNOSIS — D631 Anemia in chronic kidney disease: Secondary | ICD-10-CM | POA: Diagnosis not present

## 2019-09-09 DIAGNOSIS — Z992 Dependence on renal dialysis: Secondary | ICD-10-CM | POA: Diagnosis not present

## 2019-09-09 DIAGNOSIS — N2581 Secondary hyperparathyroidism of renal origin: Secondary | ICD-10-CM | POA: Diagnosis not present

## 2019-09-09 DIAGNOSIS — N186 End stage renal disease: Secondary | ICD-10-CM | POA: Diagnosis not present

## 2019-09-12 DIAGNOSIS — N2581 Secondary hyperparathyroidism of renal origin: Secondary | ICD-10-CM | POA: Diagnosis not present

## 2019-09-12 DIAGNOSIS — N186 End stage renal disease: Secondary | ICD-10-CM | POA: Diagnosis not present

## 2019-09-12 DIAGNOSIS — Z992 Dependence on renal dialysis: Secondary | ICD-10-CM | POA: Diagnosis not present

## 2019-09-12 DIAGNOSIS — D631 Anemia in chronic kidney disease: Secondary | ICD-10-CM | POA: Diagnosis not present

## 2019-09-14 ENCOUNTER — Telehealth: Payer: Self-pay | Admitting: Family Medicine

## 2019-09-14 DIAGNOSIS — Z992 Dependence on renal dialysis: Secondary | ICD-10-CM | POA: Diagnosis not present

## 2019-09-14 DIAGNOSIS — N2581 Secondary hyperparathyroidism of renal origin: Secondary | ICD-10-CM | POA: Diagnosis not present

## 2019-09-14 DIAGNOSIS — N186 End stage renal disease: Secondary | ICD-10-CM | POA: Diagnosis not present

## 2019-09-14 DIAGNOSIS — D631 Anemia in chronic kidney disease: Secondary | ICD-10-CM | POA: Diagnosis not present

## 2019-09-14 DIAGNOSIS — R109 Unspecified abdominal pain: Secondary | ICD-10-CM

## 2019-09-14 NOTE — Telephone Encounter (Signed)
Pt is requesting a referral to have a colonoscopy. Pt has been having a lot of stomach issues. Pt was told by Dr. Lorrene Reid told her to call Dr. [name] and ask for the referral. Thanks

## 2019-09-14 NOTE — Telephone Encounter (Signed)
Okay for referral for colonoscopy.

## 2019-09-14 NOTE — Telephone Encounter (Signed)
GI referral can be placed to discuss GI symptoms.  I am not sure if they will go ahead with doing colonoscopy as initial work-up. Thanks, BJ

## 2019-09-14 NOTE — Telephone Encounter (Signed)
Referral placed for pt

## 2019-09-16 DIAGNOSIS — Z992 Dependence on renal dialysis: Secondary | ICD-10-CM | POA: Diagnosis not present

## 2019-09-16 DIAGNOSIS — N2581 Secondary hyperparathyroidism of renal origin: Secondary | ICD-10-CM | POA: Diagnosis not present

## 2019-09-16 DIAGNOSIS — N186 End stage renal disease: Secondary | ICD-10-CM | POA: Diagnosis not present

## 2019-09-16 DIAGNOSIS — D631 Anemia in chronic kidney disease: Secondary | ICD-10-CM | POA: Diagnosis not present

## 2019-09-19 DIAGNOSIS — N186 End stage renal disease: Secondary | ICD-10-CM | POA: Diagnosis not present

## 2019-09-19 DIAGNOSIS — D631 Anemia in chronic kidney disease: Secondary | ICD-10-CM | POA: Diagnosis not present

## 2019-09-19 DIAGNOSIS — I158 Other secondary hypertension: Secondary | ICD-10-CM | POA: Diagnosis not present

## 2019-09-19 DIAGNOSIS — Z992 Dependence on renal dialysis: Secondary | ICD-10-CM | POA: Diagnosis not present

## 2019-09-19 DIAGNOSIS — N2581 Secondary hyperparathyroidism of renal origin: Secondary | ICD-10-CM | POA: Diagnosis not present

## 2019-09-20 ENCOUNTER — Telehealth: Payer: Self-pay

## 2019-09-20 NOTE — Telephone Encounter (Signed)
First of all, if antibiotics are Rx for 5 days only, it wont matter as by the time we make change, she will have completed her course. Sometimes chronic antibiotics are used, then important for [place] to follow 5-7 days post start of antibiotics. Just FYI and let others know

## 2019-09-20 NOTE — Telephone Encounter (Signed)
Pt called and is going to start taking an antibiotic today. She has an OV and INR with you 3/18/2. Should she reschedule her INR/appt with you because of this.

## 2019-09-20 NOTE — Telephone Encounter (Signed)
S/W?  Or D/W

## 2019-09-21 ENCOUNTER — Encounter: Payer: Self-pay | Admitting: *Deleted

## 2019-09-21 DIAGNOSIS — Z992 Dependence on renal dialysis: Secondary | ICD-10-CM | POA: Diagnosis not present

## 2019-09-21 DIAGNOSIS — N2581 Secondary hyperparathyroidism of renal origin: Secondary | ICD-10-CM | POA: Diagnosis not present

## 2019-09-21 DIAGNOSIS — D631 Anemia in chronic kidney disease: Secondary | ICD-10-CM | POA: Diagnosis not present

## 2019-09-21 DIAGNOSIS — N186 End stage renal disease: Secondary | ICD-10-CM | POA: Diagnosis not present

## 2019-09-22 ENCOUNTER — Telehealth: Payer: Self-pay | Admitting: *Deleted

## 2019-09-22 ENCOUNTER — Other Ambulatory Visit: Payer: Self-pay

## 2019-09-22 ENCOUNTER — Encounter: Payer: Self-pay | Admitting: Family Medicine

## 2019-09-22 ENCOUNTER — Encounter: Payer: Self-pay | Admitting: Internal Medicine

## 2019-09-22 ENCOUNTER — Ambulatory Visit (INDEPENDENT_AMBULATORY_CARE_PROVIDER_SITE_OTHER): Payer: Medicare Other | Admitting: Internal Medicine

## 2019-09-22 VITALS — BP 136/72 | HR 64 | Temp 98.2°F | Ht 65.0 in | Wt 232.0 lb

## 2019-09-22 DIAGNOSIS — K5900 Constipation, unspecified: Secondary | ICD-10-CM | POA: Diagnosis not present

## 2019-09-22 DIAGNOSIS — Z992 Dependence on renal dialysis: Secondary | ICD-10-CM | POA: Diagnosis not present

## 2019-09-22 DIAGNOSIS — R1013 Epigastric pain: Secondary | ICD-10-CM

## 2019-09-22 DIAGNOSIS — R1030 Lower abdominal pain, unspecified: Secondary | ICD-10-CM

## 2019-09-22 DIAGNOSIS — Z7901 Long term (current) use of anticoagulants: Secondary | ICD-10-CM

## 2019-09-22 DIAGNOSIS — R110 Nausea: Secondary | ICD-10-CM | POA: Diagnosis not present

## 2019-09-22 DIAGNOSIS — N186 End stage renal disease: Secondary | ICD-10-CM

## 2019-09-22 DIAGNOSIS — Z860100 Personal history of colon polyps, unspecified: Secondary | ICD-10-CM

## 2019-09-22 DIAGNOSIS — K219 Gastro-esophageal reflux disease without esophagitis: Secondary | ICD-10-CM

## 2019-09-22 MED ORDER — OMEPRAZOLE 20 MG PO CPDR: 20.0000 mg | DELAYED_RELEASE_CAPSULE | Freq: Two times a day (BID) | ORAL | 2 refills | Status: DC

## 2019-09-22 MED ORDER — SUPREP BOWEL PREP KIT 17.5-3.13-1.6 GM/177ML PO SOLN: 1.0000 | ORAL | 0 refills | Status: DC

## 2019-09-22 NOTE — Progress Notes (Signed)
[name], female   DOB: [birth year], 74 y.o.   MRN: [id number] HPI: Kharter Sestak is a 74 year old female with a past medical history of end-stage renal disease on dialysis, atrial fibrillation on warfarin, history of GERD, chronic constipation, history of adenomatous and tubulovillous adenomatous colon polyps, complicated diverticulitis status post sigmoidectomy in December 2017, hypertension who is seen to evaluate constipation, abdominal pain, nausea.  She is here today with her daughter.  She reports that she is struggling with constipation and hard stools.  This is associated with a lower abdominal pain.  At times this can be severe and feels to be more right-sided than left but can be certainly across her lower abdomen.  This is worse when she is constipated and having trouble having a bowel movement.  She has a separate nausea with regurgitation symptom.  This is worse after eating and she can feel sick.  Omeprazole 20 mg a day is not helping.  At times with laxative use she will have diarrhea and with this she can see red blood with wiping.  She has tried multiple laxatives including Amitiza which was not effective.  Linzess 72 mcg was not effective, she currently has Linzess 145 mcg which she takes daily but her insurance is no longer covering that this year.  Even with this 145 mcg dose she can have 2 or 3 days without bowel movement and she gets uncomfortable.  When she takes higher dose of Linzess 290 mcg a day she gets diarrhea.  She tried senna as much as 2 to 4 tablets at bedtime which did not help very much.  Milk of magnesia seems to work well but she was told by her nephrologist to avoid this drug containing magnesium.  She had a colonoscopy last done on 10/18/2015 by Dr. Olegario Messier at [facility]  There was a 15 mm flat adenoma in the cecum which was removed by EMR.  2 smaller ascending colon polyps were removed.  3 polyps in the transverse colon removed.  Pathology results  revealed tubular adenoma and tubulovillous adenoma.  She has not had a subsequent colonoscopy.  She developed diverticulitis and was hospitalized for the better part of the month in December 2017.  This was complicated by microperforation and she had sigmoidectomy in December 2017.  Path showed diverticulitis but no IBD or malignancy.  Past Medical History:  Diagnosis Date  . Acid reflux   . Anemia of chronic disease   . Atrial fibrillation ([place])   . Bilateral carotid bruits   . Diverticulitis   . ESRD (end stage renal disease) ([place])   . Hypertension   . Malaise and fatigue   . Orthostatic hypotension   . Shortness of breath   . Sleep apnea   . Syncope   . Tubulovillous adenoma of colon     Past Surgical History:  Procedure Laterality Date  . BACK SURGERY    . IR GENERIC HISTORICAL  07/09/2016   IR US GUIDE VASC ACCESS RIGHT 07/09/2016 Arne Cleveland, MD [facility]  . IR GENERIC HISTORICAL  07/09/2016   IR FLUORO GUIDE CV LINE RIGHT 07/09/2016 Arne Cleveland, MD [facility]  . KNEE ARTHROPLASTY Left   . LAPAROSCOPIC SIGMOID COLECTOMY N/A 07/11/2016   Procedure: LAPAROSCOPIC SIGMOID COLECTOMY;  Surgeon: Clovis Riley, MD;  Location: [facility];  Service: General;  Laterality: N/A;  . TUBAL LIGATION      Outpatient Medications Prior to Visit  Medication Sig Dispense Refill  . albuterol (  PROVENTIL HFA;VENTOLIN HFA) 108 (90 Base) MCG/ACT inhaler Inhale 1 puff into the lungs every 6 (six) hours as needed for wheezing or shortness of breath. 6.7 g 2  . Amino Acids-Protein Hydrolys (FEEDING SUPPLEMENT, PRO-STAT SUGAR FREE 64,) LIQD Take 30 mLs by mouth 2 (two) times daily. 900 mL 0  . carvedilol (COREG) 12.5 MG tablet Take 1 tablet (12.5 mg total) by mouth as needed (if BP is above 170). 60 tablet 3  . cinacalcet (SENSIPAR) 60 MG tablet Take 60 mg by mouth every other day.    . ethyl chloride spray USE 1 TO 2 SPRAYS TO DIALYSIS ACCESS EVERY TREATMENT  12  . fludrocortisone  (FLORINEF) 0.1 MG tablet TAKE 1 TABLET BY MOUTH EVERY OTHER DAY 30 tablet 0  . midodrine (PROAMATINE) 10 MG tablet Take 10 mg by mouth 3 (three) times daily.    . multivitamin (RENA-VIT) TABS tablet Take 1 tablet by mouth daily.    Marland Kitchen nystatin cream (MYCOSTATIN) Apply 1 application topically 2 (two) times daily. 45 g 0  . sevelamer carbonate (RENVELA) 800 MG tablet Take 800-1,600 mg by mouth See admin instructions. 2 tabs 3 times daily    . terconazole (TERAZOL 7) 0.4 % vaginal cream Place 1 applicator vaginally at bedtime. 45 g 1  . triamcinolone cream (KENALOG) 0.1 % Apply 1 application topically 2 (two) times daily. 45 g 0  . warfarin (COUMADIN) 5 MG tablet TAKE 1 TABLET BY MOUTH DAILY AT 6PM. TAKE 1 AND 1/2 TABLETS ON TUESDAY AND THURSDAY, 5MG  ON ALL OTHER DAYS 113 tablet 0  . omeprazole (PRILOSEC) 20 MG capsule TAKE 1 CAPSULE BY MOUTH ONCE DAILY BEFORE BREAKFAST 30 capsule 3  . AMITIZA 8 MCG capsule TAKE 1 CAPSULE BY MOUTH ONCE DAILY WITH BREAKFAST 30 capsule 0  . b complex vitamins capsule Take by mouth.    Marland Kitchen LINZESS 145 MCG CAPS capsule TAKE 1 CAPSULE BY MOUTH ONCE DAILY BEFORE BREAKFAST 90 capsule 0  . mirtazapine (REMERON) 15 MG tablet Take 0.5 tablets (7.5 mg total) by mouth at bedtime. 30 tablet 0   No facility-administered medications prior to visit.    Allergies  Allergen Reactions  . Latex Rash  . Other Other (See Comments)    The plastic tape irritates her skin.  Silicone tapes cause redness  . Penicillins Rash  . Sulfa Antibiotics Rash  . Tape Other (See Comments)    PLASTIC TAPE TEARS OFF THE SKIN AND BRUISES IT TERRIBLY!! GETS ITCHY AND RASH FROM PAPER AND PLASTIC TAPES PLASTIC TAPE TEARS OFF THE SKIN AND BRUISES IT TERRIBLY!! PLASTIC TAPE TEARS OFF THE SKIN AND BRUISES IT TERRIBLY!!    Family History  Problem Relation Age of Onset  . Heart failure Mother   . Stroke Mother   . Other Father   . Colon cancer Neg Hx   . Liver disease Neg Hx     Social History    Tobacco Use  . Smoking status: Never Smoker  . Smokeless tobacco: Never Used  Substance Use Topics  . Alcohol use: No  . Drug use: No    ROS: As per history of present illness, otherwise negative  BP 136/72   Pulse 64   Temp 98.2 F (36.8 C)   Ht 5\' 5"  (1.651 m)   Wt 232 lb (105.2 kg)   BMI 38.61 kg/m  Gen: awake, alert, NAD HEENT: anicteric, op clear CV: RRR, no mrg Pulm: CTA b/l Abd: soft, obese, diffuse tenderness without rebound or guarding,  ND, +BS throughout Ext: no c/c/e Neuro: nonfocal   ASSESSMENT/PLAN: 74 year old female with a past medical history of end-stage renal disease on dialysis, atrial fibrillation on warfarin, history of GERD, chronic constipation, history of adenomatous and tubulovillous adenomatous colon polyps, complicated diverticulitis status post sigmoidectomy in December 2017, hypertension who is seen to evaluate constipation, abdominal pain, nausea.   1.  Chronic constipation with lower abdominal pain --Linzess at 145 mcg is not completely effective for her and the 290 mcg dose causes diarrhea.  No response to Amitiza or senna.  She has not tried MiraLAX.  Given her history of complicated diverticulitis and the pain she is having I am going to recommend cross-sectional imaging followed by colonoscopy, see #2.  I am going to change laxatives given the problem that Linzess is not well covered and not completely effective --CT scan of the abdomen pelvis with IV contrast --Discontinue Linzess --Begin MiraLAX 17 g twice daily, titrate as needed, reduce dose to once daily if loose stools  2.  History of adenomatous and tubulovillous adenoma --she is overdue for surveillance colonoscopy having her last exam 4 years ago.  This is recommended but after reviewing the CT scan which is ordered.  We discussed the risk, benefits and alternatives and she is agreeable and wishes to proceed  3.  GERD/nausea/indigestion --symptoms persistent despite omeprazole at  20 mg.  Upper endoscopy recommended --Increase omeprazole to 20 mg twice daily AC --Upper endoscopy  4.  Chronic warfarin -- Will hold warfarin 5 days prior to endoscopic procedures - will instruct when and how to resume after procedure. Benefits and risks of procedure explained including risks of bleeding, perforation, infection, missed lesions, reactions to medications and possible need for hospitalization and surgery for complications. Additional rare but real risk of stroke or other vascular clotting events off warfarin also explained and need to seek urgent help if any signs of these problems occur. Will communicate by phone or EMR with patient's  prescribing provider to confirm that holding warfarin is reasonable in this case.   5.  ESRD --on hemodialysis on Monday, Wednesday and Friday     TK:PTWSFK, [name], [place],   [id number]

## 2019-09-22 NOTE — Patient Instructions (Addendum)
You have been scheduled for an endoscopy and colonoscopy. Please follow the written instructions given to you at your visit today. Please pick up your prep supplies at the pharmacy within the next 1-3 days. If you use inhalers (even only as needed), please bring them with you on the day of your procedure. Your physician has requested that you go to www.startemmi.com and enter the access code given to you at your visit today. This web site gives a general overview about your procedure. However, you should still follow specific instructions given to you by our office regarding your preparation for the procedure.  ______________________________________________________________ You have been scheduled for a CT scan of the abdomen and pelvis at [facility] (1st floor of hospital).   You are scheduled on Tuesday, 10/04/19 at 9:00 am. You should arrive 15 minutes prior to your appointment time for registration. Please follow the written instructions below on the day of your exam:  WARNING: IF YOU ARE ALLERGIC TO IODINE/X-RAY DYE, PLEASE NOTIFY RADIOLOGY IMMEDIATELY AT [phone]! YOU WILL BE GIVEN A 13 HOUR PREMEDICATION PREP.  1) Do not eat or drink anything after 5:00 am (4 hours prior to your test) 2) You have been given 2 bottles of oral contrast to drink. The solution may taste better if refrigerated, but do NOT add ice or any other liquid to this solution. Shake well before drinking.    Drink 1 bottle of contrast @ 7:00 am (2 hours prior to your exam)  Drink 1 bottle of contrast @ 8:00 am (1 hour prior to your exam)  You may take any medications as prescribed with a small amount of water, if necessary. If you take any of the following medications: METFORMIN, GLUCOPHAGE, GLUCOVANCE, AVANDAMET, RIOMET, FORTAMET, [place] MET, JANUMET, GLUMETZA or METAGLIP, you MAY be asked to HOLD this medication 48 hours AFTER the exam.  The purpose of you drinking the oral contrast is to aid in the  visualization of your intestinal tract. The contrast solution may cause some diarrhea. Depending on your individual set of symptoms, you may also receive an intravenous injection of x-ray contrast/dye. Plan on being at [facility] for 30 minutes or longer, depending on the type of exam you are having performed.  This test typically takes 30-45 minutes to complete.  If you have any questions regarding your exam or if you need to reschedule, you may call the CT department at [phone] between the hours of 8:00 am and 5:00 pm, Monday-Friday.  _______________________________________________________________  Janalyn Shy.  ________________________________________________________________  We have sent the following medications to your pharmacy for you to pick up at your convenience: Omeprazole 20 mg twice daily before meals (increase from your previous dosage)  _______________________________________________________________  Please purchase the following medications over the counter and take as directed: Miralax 17 grams (1 capful) twice daily.  ________________________________________________________________  If you are age 2 or older, your body mass index should be between 23-30. Your Body mass index is 38.61 kg/m. If this is out of the aforementioned range listed, please consider follow up with your Primary Care Provider.  If you are age 34 or younger, your body mass index should be between 19-25. Your Body mass index is 38.61 kg/m. If this is out of the aformentioned range listed, please consider follow up with your Primary Care Provider.   ________________________________________________________________  Due to recent changes in healthcare laws, you may see the results of your imaging and laboratory studies on MyChart before your provider has had a chance  to review them.  We understand that in some cases there may be results that are confusing or concerning to you. Not all  laboratory results come back in the same time frame and the provider may be waiting for multiple results in order to interpret others.  Please give [place] 48 hours in order for your provider to thoroughly review all the results before contacting the office for clarification of your results.

## 2019-09-22 NOTE — Telephone Encounter (Signed)
   Terrill Wauters 05-Apr-1946 [id number]  Dear Dr Einar Gip:  We have scheduled the above named patient for a(n) endoscopy/colonoscopy procedure. Our records show that (s)he is on anticoagulation therapy.  Please advise as to whether the patient may come off their therapy of coumadin 5 days prior to their procedure which is scheduled for 11/03/19.  Please route your response to Dixon Boos, [place] or fax response to [phone].  Sincerely,   [facility]

## 2019-09-22 NOTE — Telephone Encounter (Signed)
I do not see holding Coumadin for 5 days prior to procedure and  restart the same day as procedure and patient to return to [place] for INR check in 5 days post procedure for INR check.  JG

## 2019-09-23 ENCOUNTER — Other Ambulatory Visit: Payer: Self-pay | Admitting: Family Medicine

## 2019-09-23 ENCOUNTER — Other Ambulatory Visit: Payer: Self-pay | Admitting: Cardiology

## 2019-09-23 DIAGNOSIS — N186 End stage renal disease: Secondary | ICD-10-CM | POA: Diagnosis not present

## 2019-09-23 DIAGNOSIS — I480 Paroxysmal atrial fibrillation: Secondary | ICD-10-CM

## 2019-09-23 DIAGNOSIS — N2581 Secondary hyperparathyroidism of renal origin: Secondary | ICD-10-CM | POA: Diagnosis not present

## 2019-09-23 DIAGNOSIS — Z992 Dependence on renal dialysis: Secondary | ICD-10-CM | POA: Diagnosis not present

## 2019-09-23 DIAGNOSIS — I951 Orthostatic hypotension: Secondary | ICD-10-CM

## 2019-09-23 DIAGNOSIS — D631 Anemia in chronic kidney disease: Secondary | ICD-10-CM | POA: Diagnosis not present

## 2019-09-23 NOTE — Telephone Encounter (Signed)
Dr Einar Gip- I apologize, can you please clarify that you ARE okay with 5 day coumadin hold, restart same day as procedure and returning to you for INR check 5 days post procedure? Just have to make sure the "I do NOT see holding coumadin" is typographical error.   Thank you so much for your help! Have a great day.

## 2019-09-24 NOTE — Telephone Encounter (Signed)
Ha ha, thanks, I do not see any contraindication for holding Coumadin for 5 days. Thanks

## 2019-09-25 ENCOUNTER — Ambulatory Visit: Payer: Medicare Other | Attending: Internal Medicine

## 2019-09-25 DIAGNOSIS — Z23 Encounter for immunization: Secondary | ICD-10-CM | POA: Insufficient documentation

## 2019-09-25 NOTE — Progress Notes (Signed)
   Covid-19 Vaccination Clinic  Name:  Jane [name]    MRN: [id number] DOB: 03/29/1946  09/25/2019  Jane [name] was observed post Covid-19 immunization for 15 minutes without incident. She was provided with Vaccine Information Sheet and instruction to access the V-Safe system.   Jane [name] was instructed to call 911 with any severe reactions post vaccine: Marland Kitchen Difficulty breathing  . Swelling of face and throat  . A fast heartbeat  . A bad rash all over body  . Dizziness and weakness   Immunizations Administered    Name Date Dose VIS Date Route   Pfizer COVID-19 Vaccine 09/25/2019  8:17 AM 0.3 mL 07/01/2019 Intramuscular   Manufacturer: [place]   Lot: [id number]   [place]: [id number]

## 2019-09-26 DIAGNOSIS — D631 Anemia in chronic kidney disease: Secondary | ICD-10-CM | POA: Diagnosis not present

## 2019-09-26 DIAGNOSIS — N2581 Secondary hyperparathyroidism of renal origin: Secondary | ICD-10-CM | POA: Diagnosis not present

## 2019-09-26 DIAGNOSIS — Z992 Dependence on renal dialysis: Secondary | ICD-10-CM | POA: Diagnosis not present

## 2019-09-26 DIAGNOSIS — N186 End stage renal disease: Secondary | ICD-10-CM | POA: Diagnosis not present

## 2019-09-26 NOTE — Telephone Encounter (Signed)
I have spoken to patient to advise that per Dr Einar Gip, she may hold coumadin 5 days prior to her upcoming endoscopy/colonoscopy and should restart her medication same day as procedure (pending okay with Dr Hilarie Fredrickson). I also advised that Dr Einar Gip wants her to have INR drawn 5 days post procedure. Patient verbalizes understanding of this information.

## 2019-09-28 DIAGNOSIS — Z992 Dependence on renal dialysis: Secondary | ICD-10-CM | POA: Diagnosis not present

## 2019-09-28 DIAGNOSIS — D631 Anemia in chronic kidney disease: Secondary | ICD-10-CM | POA: Diagnosis not present

## 2019-09-28 DIAGNOSIS — N2581 Secondary hyperparathyroidism of renal origin: Secondary | ICD-10-CM | POA: Diagnosis not present

## 2019-09-28 DIAGNOSIS — N186 End stage renal disease: Secondary | ICD-10-CM | POA: Diagnosis not present

## 2019-09-29 ENCOUNTER — Other Ambulatory Visit: Payer: Self-pay

## 2019-09-29 ENCOUNTER — Telehealth: Payer: Self-pay | Admitting: Internal Medicine

## 2019-09-29 DIAGNOSIS — K219 Gastro-esophageal reflux disease without esophagitis: Secondary | ICD-10-CM

## 2019-09-29 DIAGNOSIS — N644 Mastodynia: Secondary | ICD-10-CM | POA: Diagnosis not present

## 2019-09-29 DIAGNOSIS — R921 Mammographic calcification found on diagnostic imaging of breast: Secondary | ICD-10-CM | POA: Diagnosis not present

## 2019-09-29 LAB — HM MAMMOGRAPHY

## 2019-09-29 NOTE — Telephone Encounter (Signed)
Vanda from [facility] CT requested order for i-STAT creatinine placed for pt.  Pt is scheduled for a CT 10/06/19.

## 2019-09-29 NOTE — Telephone Encounter (Signed)
Order entered per request.

## 2019-09-30 ENCOUNTER — Encounter: Payer: Self-pay | Admitting: Family Medicine

## 2019-09-30 DIAGNOSIS — D631 Anemia in chronic kidney disease: Secondary | ICD-10-CM | POA: Diagnosis not present

## 2019-09-30 DIAGNOSIS — N186 End stage renal disease: Secondary | ICD-10-CM | POA: Diagnosis not present

## 2019-09-30 DIAGNOSIS — N2581 Secondary hyperparathyroidism of renal origin: Secondary | ICD-10-CM | POA: Diagnosis not present

## 2019-09-30 DIAGNOSIS — Z992 Dependence on renal dialysis: Secondary | ICD-10-CM | POA: Diagnosis not present

## 2019-10-03 DIAGNOSIS — D631 Anemia in chronic kidney disease: Secondary | ICD-10-CM | POA: Diagnosis not present

## 2019-10-03 DIAGNOSIS — Z992 Dependence on renal dialysis: Secondary | ICD-10-CM | POA: Diagnosis not present

## 2019-10-03 DIAGNOSIS — N186 End stage renal disease: Secondary | ICD-10-CM | POA: Diagnosis not present

## 2019-10-03 DIAGNOSIS — N2581 Secondary hyperparathyroidism of renal origin: Secondary | ICD-10-CM | POA: Diagnosis not present

## 2019-10-04 ENCOUNTER — Other Ambulatory Visit (HOSPITAL_COMMUNITY): Payer: Medicare Other

## 2019-10-04 ENCOUNTER — Telehealth: Payer: Self-pay | Admitting: Pharmacist

## 2019-10-04 ENCOUNTER — Ambulatory Visit: Payer: Medicare Other | Admitting: Cardiology

## 2019-10-04 NOTE — Telephone Encounter (Signed)
Called to enroll patient in [facility] services for warfarin management. Called and LVM for pt to CB

## 2019-10-05 DIAGNOSIS — Z992 Dependence on renal dialysis: Secondary | ICD-10-CM | POA: Diagnosis not present

## 2019-10-05 DIAGNOSIS — D631 Anemia in chronic kidney disease: Secondary | ICD-10-CM | POA: Diagnosis not present

## 2019-10-05 DIAGNOSIS — N186 End stage renal disease: Secondary | ICD-10-CM | POA: Diagnosis not present

## 2019-10-05 DIAGNOSIS — D689 Coagulation defect, unspecified: Secondary | ICD-10-CM | POA: Diagnosis not present

## 2019-10-05 DIAGNOSIS — N2581 Secondary hyperparathyroidism of renal origin: Secondary | ICD-10-CM | POA: Diagnosis not present

## 2019-10-06 ENCOUNTER — Encounter: Payer: Self-pay | Admitting: Cardiology

## 2019-10-06 ENCOUNTER — Ambulatory Visit (HOSPITAL_COMMUNITY)
Admission: RE | Admit: 2019-10-06 | Discharge: 2019-10-06 | Disposition: A | Discharge status: Home / Self Care | Payer: Medicare Other | Source: Ambulatory Visit | Attending: Internal Medicine | Admitting: Internal Medicine

## 2019-10-06 ENCOUNTER — Ambulatory Visit: Payer: Medicare Other | Admitting: Cardiology

## 2019-10-06 ENCOUNTER — Other Ambulatory Visit: Payer: Self-pay

## 2019-10-06 VITALS — Temp 98.0°F | Ht 65.0 in | Wt 232.0 lb

## 2019-10-06 DIAGNOSIS — I480 Paroxysmal atrial fibrillation: Secondary | ICD-10-CM

## 2019-10-06 DIAGNOSIS — Z992 Dependence on renal dialysis: Secondary | ICD-10-CM

## 2019-10-06 DIAGNOSIS — K219 Gastro-esophageal reflux disease without esophagitis: Secondary | ICD-10-CM

## 2019-10-06 DIAGNOSIS — I10 Essential (primary) hypertension: Secondary | ICD-10-CM | POA: Insufficient documentation

## 2019-10-06 DIAGNOSIS — K5900 Constipation, unspecified: Secondary | ICD-10-CM | POA: Insufficient documentation

## 2019-10-06 DIAGNOSIS — Z7901 Long term (current) use of anticoagulants: Secondary | ICD-10-CM | POA: Diagnosis not present

## 2019-10-06 DIAGNOSIS — R110 Nausea: Secondary | ICD-10-CM

## 2019-10-06 DIAGNOSIS — R1032 Left lower quadrant pain: Secondary | ICD-10-CM | POA: Diagnosis not present

## 2019-10-06 DIAGNOSIS — R1030 Lower abdominal pain, unspecified: Secondary | ICD-10-CM | POA: Diagnosis not present

## 2019-10-06 DIAGNOSIS — N186 End stage renal disease: Secondary | ICD-10-CM

## 2019-10-06 DIAGNOSIS — I951 Orthostatic hypotension: Secondary | ICD-10-CM

## 2019-10-06 DIAGNOSIS — Z5181 Encounter for therapeutic drug level monitoring: Secondary | ICD-10-CM | POA: Diagnosis not present

## 2019-10-06 LAB — LIPID PANEL
Cholesterol: 198 mg/dL (ref 0–200)
HDL: 53 mg/dL (ref >40)
LDL Cholesterol: 135 mg/dL — ABNORMAL HIGH (ref 0–99)
Total CHOL/HDL Ratio: 3.7 RATIO
Triglycerides: 51 mg/dL (ref <150)
VLDL: 10 mg/dL (ref 0–40)

## 2019-10-06 LAB — TSH: TSH: 1.129 u[IU]/mL (ref 0.350–4.500)

## 2019-10-06 LAB — POCT INR: INR: 2.2 (ref 2.0–3.0)

## 2019-10-06 MED ORDER — IOHEXOL 300 MG/ML  SOLN
75.0000 mL | Freq: Once | INTRAMUSCULAR | Status: AC | PRN
  Administered 2019-10-06: 75 mL via INTRAVENOUS

## 2019-10-06 MED ORDER — SODIUM CHLORIDE (PF) 0.9 % IJ SOLN
INTRAMUSCULAR | Status: AC
  Filled 2019-10-06: qty 50

## 2019-10-06 NOTE — Progress Notes (Signed)
Primary Physician/Referring:  [name], [name], MD  [name], female    DOB: 1946-05-01, 74 y.o.   MRN: [id number]  Chief Complaint  Patient presents with  . Atrial Fibrillation  . Orthostatic hypotension    6 month f/u.    HPI:    Jane [name]  is a 74 y.o. Paroxysmal atrial fibrillation, on low-dose amiodarone, dose reduced due to prolonged QT, end-stage disease on hemodialysis on Monday at rest and Friday, orthostatic hypotension, obstructive sleep apnea on CPAP, external hemorrhoids but tolerating Coumadin presents here for 45-monthfollow-up.  She is presently doing well, except for abdominal discomfort, significant constipation, since she has had history of diverticulosis SP partial bowel resection.  She is scheduled for abdominal CT today.   Past Medical History:  Diagnosis Date  . Acid reflux   . Anemia of chronic disease   . Atrial fibrillation ([place]   . Bilateral carotid bruits   . Diverticulitis   . ESRD (end stage renal disease) ([place]   . Hypertension   . Malaise and fatigue   . Orthostatic hypotension   . Shortness of breath   . Sleep apnea   . Syncope   . Tubulovillous adenoma of colon    Past Surgical History:  Procedure Laterality Date  . BACK SURGERY    . IR GENERIC HISTORICAL  07/09/2016   IR [place] RIGHT 07/09/2016 DArne Cleveland MD [facility]  . IR GENERIC HISTORICAL  07/09/2016   IR FLUORO GUIDE CV LINE RIGHT 07/09/2016 DArne Cleveland MD [facility]  . KNEE ARTHROPLASTY Left   . LAPAROSCOPIC SIGMOID COLECTOMY N/A 07/11/2016   Procedure: LAPAROSCOPIC SIGMOID COLECTOMY;  Surgeon: CClovis Riley MD;  [place]  Service: General;  Laterality: N/A;  . TUBAL LIGATION     Family History  Problem Relation Age of Onset  . Heart failure Mother   . Stroke Mother   . Other Father   . Colon cancer Neg Hx   . Liver disease Neg Hx     Social History   Tobacco Use  . Smoking status: Never Smoker  . Smokeless  tobacco: Never Used  Substance Use Topics  . Alcohol use: No   ROS  Review of Systems  Constitution: Negative for malaise/fatigue.  Cardiovascular: Negative for chest pain, dyspnea on exertion and leg swelling.  Gastrointestinal: Positive for abdominal pain, constipation and hemorrhoids (not active). Negative for melena.  Neurological: Positive for dizziness (with low BP).   Objective  Temperature 98 F (36.7 C), height _0  (1.651 m), weight 232 lb (105.2 kg), SpO2 97 %.  Vitals with BMI 10/06/2019 09/22/2019 07/28/2019  Height _1  _2  _3   Weight 232 lbs 232 lbs 240 lbs  BMI 38.61 [phone] Systolic - [id number] Diastolic - 72 74  Pulse - 64 64   Orthostatic VS for the past 72 hrs (Last 3 readings):  Orthostatic BP Patient Position BP Location Cuff Size Orthostatic Pulse  10/06/19 0907 142/62 Standing Left Arm Large 64  10/06/19 0906 145/56 Sitting Left Arm Large 65  10/06/19 0905 166/66 Supine Left Arm Large 66     Physical Exam  Constitutional:  Moderately obese  Cardiovascular: Normal rate, regular rhythm and intact distal pulses. Exam reveals no gallop.  Murmur heard.  Early systolic murmur is present with a grade of 2/6 at the upper right sternal border. Pulses:      Carotid pulses are on the right side  with bruit and on the left side with bruit. No leg edema, no JVD. Right arm AV fistula noted and functioning.  Pulmonary/Chest: Effort normal and breath sounds normal.  Abdominal: Soft. Bowel sounds are normal.   Laboratory examination:   No results for input(s): NA, K, CL, CO2, GLUCOSE, BUN, CREATININE, CALCIUM, GFRNONAA, GFRAA in the last 8760 hours. CrCl cannot be calculated (Patient's most recent lab result is older than the maximum 21 days allowed.).  CMP Latest Ref Rng & Units 08/04/2016 07/18/2016 07/16/2016  Glucose 65 - 99 mg/dL 88 91 82  BUN 6 - 20 mg/dL 28(H) 17 35(H)  Creatinine 0.44 - 1.00 mg/dL 6.44(H) 7.59(H) 8.75(H)  Sodium 135 - 145 mmol/L 139  133(L) 134(L)  Potassium 3.5 - 5.1 mmol/L 4.9 3.2(L) 3.7  Chloride 101 - 111 mmol/L 104 98(L) 98(L)  CO2 22 - 32 mmol/L _0 Calcium 8.9 - 10.3 mg/dL 8.8(L) 7.7(L) 7.9(L)  Total Protein 6.5 - 8.1 g/dL 7.1 - -  Total Bilirubin 0.3 - 1.2 mg/dL 0.5 - -  Alkaline Phos 38 - 126 U/L 104 - -  AST 15 - 41 U/L 18 - -  ALT 14 - 54 U/L 11(L) - -   CBC Latest Ref Rng & Units 09/22/2018 01/26/2018 08/04/2016  WBC 4.0 - 10.5 K/uL 5.5 4.4 5.8  Hemoglobin 12.0 - 15.0 g/dL 10.7(L) 10.9(L) 10.1(L)  Hematocrit 36.0 - 46.0 % 33.0(L) 32.9(L) 32.2(L)  Platelets 150.0 - 400.0 K/uL 234.0 149.0(L) 154   Lipid Panel     Component Value Date/Time   CHOL 198 10/06/2019 1510   TRIG 51 10/06/2019 1510   HDL 53 10/06/2019 1510   CHOLHDL 3.7 10/06/2019 1510   VLDL 10 10/06/2019 1510   LDLCALC 135 (H) 10/06/2019 1510   HEMOGLOBIN A1C No results found for: HGBA1C, MPG TSH Recent Labs    10/06/19 1510  TSH 1.129    Medications and allergies   Allergies  Allergen Reactions  . Latex Rash  . Other Other (See Comments)    The plastic tape irritates her skin.  Silicone tapes cause redness  . Penicillins Rash  . Sulfa Antibiotics Rash  . Tape Other (See Comments)    PLASTIC TAPE TEARS OFF THE SKIN AND BRUISES IT TERRIBLY!! GETS ITCHY AND RASH FROM PAPER AND PLASTIC TAPES PLASTIC TAPE TEARS OFF THE SKIN AND BRUISES IT TERRIBLY!! PLASTIC TAPE TEARS OFF THE SKIN AND BRUISES IT TERRIBLY!!     Current Outpatient Medications  Medication Instructions  . albuterol (PROVENTIL HFA;VENTOLIN HFA) 108 (90 Base) MCG/ACT inhaler 1 puff, Inhalation, Every 6 hours PRN  . Amino Acids-Protein Hydrolys (FEEDING SUPPLEMENT, PRO-STAT SUGAR FREE 64,) LIQD 30 mLs, Oral, 2 times daily  . carvedilol (COREG) 12.5 mg, Oral, As needed  . cinacalcet (SENSIPAR) 60 mg, Oral, Every other day  . ethyl chloride spray USE 1 TO 2 SPRAYS TO DIALYSIS ACCESS EVERY TREATMENT  . fludrocortisone (FLORINEF) 0.1 MG tablet TAKE 1 TABLET BY  MOUTH EVERY OTHER DAY  . midodrine (PROAMATINE) 10 mg, Oral, 3 times daily  . multivitamin (RENA-VIT) TABS tablet 1 tablet, Oral, Daily  . nystatin cream (MYCOSTATIN) 1 application, Topical, 2 times daily  . omeprazole (PRILOSEC) 20 mg, Oral, 2 times daily  . sevelamer carbonate (RENVELA) 800-1,600 mg, Oral, See admin instructions, 2 tabs 3 times daily  . SUPREP BOWEL PREP KIT 17.5-3.13-1.6 GM/177ML SOLN 1 kit, Oral, As directed, For colonoscopy prep BIN: [id number] PCN: CN GROUP: [id number] ID: [id number]  . terconazole (TERAZOL  7) 0.4 % vaginal cream 1 applicator, Vaginal, Daily at bedtime  . triamcinolone cream (KENALOG) 0.1 % 1 application, Topical, 2 times daily  . warfarin (COUMADIN) 5 MG tablet TAKE 1 & 1/2 (ONE & ONE-HALF) TABLETS BY MOUTH EVERY TUESDAY AND EVERY THURSDAY AND 1 TABLET ONCE DAILY ALL OTHER DAYS   Radiology:   CT Abdomen Pelvis W Contrast  Result Date: 10/06/2019 CLINICAL DATA:  Left lower quadrant abdominal pain, constipation EXAM: CT ABDOMEN AND PELVIS WITH CONTRAST TECHNIQUE: Multidetector CT imaging of the abdomen and pelvis was performed using the standard protocol following bolus administration of intravenous contrast. CONTRAST:  88m OMNIPAQUE IOHEXOL 300 MG/ML SOLN, additional oral enteric contrast COMPARISON:  08/04/2016 FINDINGS: Lower chest: No acute abnormality. Coronary artery calcifications. Hepatobiliary: No solid liver abnormality is seen. No gallstones, gallbladder wall thickening, or biliary dilatation. Pancreas: Unremarkable. No pancreatic ductal dilatation or surrounding inflammatory changes. Spleen: Normal in size without significant abnormality. Adrenals/Urinary Tract: Adrenal glands are unremarkable. Atrophic, hypoenhancing kidneys with innumerable low-attenuation renal lesions. Bladder is unremarkable. Stomach/Bowel: Stomach is within normal limits. Appendix appears normal. Evidence of prior sigmoid colon resection and reanastomosis. Vascular/Lymphatic:  Aortic atherosclerosis. No enlarged abdominal or pelvic lymph nodes. Reproductive: No mass or other significant abnormality. Other: No abdominal wall hernia or abnormality. No abdominopelvic ascites. Musculoskeletal: No acute or significant osseous findings. IMPRESSION: 1. No acute CT findings to explain left lower quadrant abdominal pain. 2. Evidence of prior sigmoid colon resection and reanastomosis. No significant diverticular disease of the remaining colon. 3. Acquired cystic kidney disease, in keeping with renal dialysis. 4. Coronary artery disease. Aortic Atherosclerosis (ICD10-I70.0). Electronically Signed   By: AEddie CandleM.D.   On: 10/06/2019 15:28    Cardiac Studies:   Carotid artery duplex 020-May-2018 No hemodynamically significant arterial disease in the internal carotid artery bilaterally. There is mild calcific plaque bilaterally. Left carotid is mildly tortuous and may be a source of bruit.  Antegrade vertebral artery flow.  Echocardiogram 11/24/2017: Left ventricle cavity is normal in size. Mild concentric hypertrophy of the left ventricle. Normal global wall motion. Doppler evidence of grade II (pseudonormal) diastolic dysfunction, elevated LAP. Calculated EF 65%. Left atrial cavity is severely dilated. Mildly restricted aortic valve leaflets with trace aortic stenosis. Aortic valve mean gradient of 6 mmHg, Vmax of 1.8 m/s. Calculated aortic valve area by continuity equation is 2.1 cm. Mild (Grade I) mitral regurgitation. Mild to moderate tricuspid regurgitation. Estimated pulmonary artery systolic pressure 329-93mmHg.  Lexiscan myoview stress test 09/06/2018:  1. Lexiscan stress test was performed. Exercise capacity was not assessed. Stress symptoms included dyspnea, dizziness. Peak effect blood pressure was 158/68 mmHg. The resting and stress electrocardiogram demonstrated normal sinus rhythm, normal resting conduction, no resting arrhythmias and normal rest repolarization.  Stress EKG is non diagnostic for ischemia as it is a pharmacologic stress.  2. The overall quality of the study is good. There is no evidence of abnormal lung activity. Stress and rest SPECT images demonstrate homogeneous tracer distribution throughout the myocardium. Gated SPECT imaging reveals normal myocardial thickening and wall motion. The left ventricular ejection fraction was normal (61%).  3. Low risk study.  EKG  EKG 10/06/2019: Normal sinus rhythm with rate of 67 bpm, left atrial enlargement, left axis deviation, left anterior fascicular block.  Anteroseptal infarct old.  No evidence of ischemia.  Low-voltage complexes.  No significant change from EKG 04/12/2019.   Assessment     ICD-10-CM   1. Paroxysmal atrial fibrillation (HCC)  I48.0 POCT INR  EKG 12-Lead  2. Orthostatic hypotension  I95.1   3. Chronic anticoagulation  Z79.01   4. ESRD on hemodialysis (HCC)  N18.6    Z99.2   5. Supine hypertension  I10 Lipid Panel With LDL/HDL Ratio    TSH    INR is therapeutic at 2.2.  Presently on 5 mg daily except Tuesdays and Thursdays 7.5 mg.  Continue the same, recheck in 1 month.   No orders of the defined types were placed in this encounter.   There are no discontinued medications.  Recommendations:   Jane [name]  is a 74 y.o. Paroxysmal atrial fibrillation, on low-dose amiodarone, dose reduced due to prolonged QT, end-stage disease on hemodialysis on Monday at rest and Friday, orthostatic hypotension, obstructive sleep apnea on CPAP, external hemorrhoids but tolerating Coumadin presents here for 40-monthfollow-up.  She is presently doing well, except for abdominal discomfort, significant constipation, since she has had history of diverticulosis SP partial bowel resection.  She is scheduled for abdominal CT today.   Although her blood pressure was slightly elevated today, I did not aggressively change her medications as she has episodes of dizziness and low blood pressure and  she is also orthostatic.  She has not had lipid profile or TSH done in a long while, I have placed orders for the same.  Otherwise she remains well, there is no clinical angina heart failure, she is tolerating Coumadin without bleeding diathesis.  Due to Covid, her daughter is unable to bring her to the office on a regular basis and she does not want her mother to use public transportation.  She would like [place] manage her INR remotely when she gets her INR done during dialysis.  I have advised her that I would be happy to do this as long as she was able to send me my chart messages, she will sign up for my chart today.  Chronic care management would be very appropriate in view of her multiple medical issues including renal failure, orthostatic hypotension and supine hypertension, I have enrolled her into the same.  Will manage her INR remotely along with the help of my pharmacist.    We are back in 6 months for follow-up.  JAdrian Prows MD, FYouth Villages [facility]3/20/2021, 9:16 AM PJennerstownCardiovascular. PArcadia UniversityOffice: [phone]

## 2019-10-06 NOTE — Progress Notes (Signed)
IV team no longer needed at this time. PIV obtained by CT staff

## 2019-10-06 NOTE — Progress Notes (Signed)
INR is therapeutic at 2.2.  Presently on 5 mg daily except Tuesdays and Thursdays 7.5 mg.  Continue the same, recheck in 1 month.  7.5 mg every Tue, Thu; 5 mg all other days No changeStart Over  Maintenance plan weekly total: 40 mg Tablets on hand: 7.5 mg & 5 mg Total dose from past 7 days: 40 mg

## 2019-10-07 DIAGNOSIS — D631 Anemia in chronic kidney disease: Secondary | ICD-10-CM | POA: Diagnosis not present

## 2019-10-07 DIAGNOSIS — N2581 Secondary hyperparathyroidism of renal origin: Secondary | ICD-10-CM | POA: Diagnosis not present

## 2019-10-07 DIAGNOSIS — N186 End stage renal disease: Secondary | ICD-10-CM | POA: Diagnosis not present

## 2019-10-07 DIAGNOSIS — Z992 Dependence on renal dialysis: Secondary | ICD-10-CM | POA: Diagnosis not present

## 2019-10-07 DIAGNOSIS — D689 Coagulation defect, unspecified: Secondary | ICD-10-CM | POA: Diagnosis not present

## 2019-10-10 DIAGNOSIS — N186 End stage renal disease: Secondary | ICD-10-CM | POA: Diagnosis not present

## 2019-10-10 DIAGNOSIS — Z992 Dependence on renal dialysis: Secondary | ICD-10-CM | POA: Diagnosis not present

## 2019-10-10 DIAGNOSIS — D631 Anemia in chronic kidney disease: Secondary | ICD-10-CM | POA: Diagnosis not present

## 2019-10-10 DIAGNOSIS — N2581 Secondary hyperparathyroidism of renal origin: Secondary | ICD-10-CM | POA: Diagnosis not present

## 2019-10-12 DIAGNOSIS — N2581 Secondary hyperparathyroidism of renal origin: Secondary | ICD-10-CM | POA: Diagnosis not present

## 2019-10-12 DIAGNOSIS — D631 Anemia in chronic kidney disease: Secondary | ICD-10-CM | POA: Diagnosis not present

## 2019-10-12 DIAGNOSIS — N186 End stage renal disease: Secondary | ICD-10-CM | POA: Diagnosis not present

## 2019-10-12 DIAGNOSIS — Z992 Dependence on renal dialysis: Secondary | ICD-10-CM | POA: Diagnosis not present

## 2019-10-13 ENCOUNTER — Telehealth: Payer: Self-pay | Admitting: Pharmacist

## 2019-10-13 DIAGNOSIS — I480 Paroxysmal atrial fibrillation: Secondary | ICD-10-CM | POA: Diagnosis not present

## 2019-10-13 NOTE — Telephone Encounter (Signed)
Discussed PCM services with patient. Pt gave written consent to enroll. A personalized care plan was created and uploaded to EHR. Pharmacist to follow up monthly and as needed.

## 2019-10-14 DIAGNOSIS — Z992 Dependence on renal dialysis: Secondary | ICD-10-CM | POA: Diagnosis not present

## 2019-10-14 DIAGNOSIS — D631 Anemia in chronic kidney disease: Secondary | ICD-10-CM | POA: Diagnosis not present

## 2019-10-14 DIAGNOSIS — N2581 Secondary hyperparathyroidism of renal origin: Secondary | ICD-10-CM | POA: Diagnosis not present

## 2019-10-14 DIAGNOSIS — N186 End stage renal disease: Secondary | ICD-10-CM | POA: Diagnosis not present

## 2019-10-17 DIAGNOSIS — Z992 Dependence on renal dialysis: Secondary | ICD-10-CM | POA: Diagnosis not present

## 2019-10-17 DIAGNOSIS — N2581 Secondary hyperparathyroidism of renal origin: Secondary | ICD-10-CM | POA: Diagnosis not present

## 2019-10-17 DIAGNOSIS — N186 End stage renal disease: Secondary | ICD-10-CM | POA: Diagnosis not present

## 2019-10-17 DIAGNOSIS — D631 Anemia in chronic kidney disease: Secondary | ICD-10-CM | POA: Diagnosis not present

## 2019-10-18 DIAGNOSIS — I10 Essential (primary) hypertension: Secondary | ICD-10-CM | POA: Diagnosis not present

## 2019-10-19 DIAGNOSIS — N186 End stage renal disease: Secondary | ICD-10-CM | POA: Diagnosis not present

## 2019-10-19 DIAGNOSIS — N2581 Secondary hyperparathyroidism of renal origin: Secondary | ICD-10-CM | POA: Diagnosis not present

## 2019-10-19 DIAGNOSIS — D631 Anemia in chronic kidney disease: Secondary | ICD-10-CM | POA: Diagnosis not present

## 2019-10-19 DIAGNOSIS — Z992 Dependence on renal dialysis: Secondary | ICD-10-CM | POA: Diagnosis not present

## 2019-10-20 DIAGNOSIS — I158 Other secondary hypertension: Secondary | ICD-10-CM | POA: Diagnosis not present

## 2019-10-20 DIAGNOSIS — N186 End stage renal disease: Secondary | ICD-10-CM | POA: Diagnosis not present

## 2019-10-20 DIAGNOSIS — Z992 Dependence on renal dialysis: Secondary | ICD-10-CM | POA: Diagnosis not present

## 2019-10-21 DIAGNOSIS — N186 End stage renal disease: Secondary | ICD-10-CM | POA: Diagnosis not present

## 2019-10-21 DIAGNOSIS — D509 Iron deficiency anemia, unspecified: Secondary | ICD-10-CM | POA: Diagnosis not present

## 2019-10-21 DIAGNOSIS — Z992 Dependence on renal dialysis: Secondary | ICD-10-CM | POA: Diagnosis not present

## 2019-10-21 DIAGNOSIS — N2581 Secondary hyperparathyroidism of renal origin: Secondary | ICD-10-CM | POA: Diagnosis not present

## 2019-10-21 DIAGNOSIS — D631 Anemia in chronic kidney disease: Secondary | ICD-10-CM | POA: Diagnosis not present

## 2019-10-24 DIAGNOSIS — N2581 Secondary hyperparathyroidism of renal origin: Secondary | ICD-10-CM | POA: Diagnosis not present

## 2019-10-24 DIAGNOSIS — Z992 Dependence on renal dialysis: Secondary | ICD-10-CM | POA: Diagnosis not present

## 2019-10-24 DIAGNOSIS — D509 Iron deficiency anemia, unspecified: Secondary | ICD-10-CM | POA: Diagnosis not present

## 2019-10-24 DIAGNOSIS — D631 Anemia in chronic kidney disease: Secondary | ICD-10-CM | POA: Diagnosis not present

## 2019-10-24 DIAGNOSIS — N186 End stage renal disease: Secondary | ICD-10-CM | POA: Diagnosis not present

## 2019-10-26 DIAGNOSIS — N186 End stage renal disease: Secondary | ICD-10-CM | POA: Diagnosis not present

## 2019-10-26 DIAGNOSIS — Z992 Dependence on renal dialysis: Secondary | ICD-10-CM | POA: Diagnosis not present

## 2019-10-26 DIAGNOSIS — D631 Anemia in chronic kidney disease: Secondary | ICD-10-CM | POA: Diagnosis not present

## 2019-10-26 DIAGNOSIS — D509 Iron deficiency anemia, unspecified: Secondary | ICD-10-CM | POA: Diagnosis not present

## 2019-10-26 DIAGNOSIS — N2581 Secondary hyperparathyroidism of renal origin: Secondary | ICD-10-CM | POA: Diagnosis not present

## 2019-10-28 DIAGNOSIS — N2581 Secondary hyperparathyroidism of renal origin: Secondary | ICD-10-CM | POA: Diagnosis not present

## 2019-10-28 DIAGNOSIS — D689 Coagulation defect, unspecified: Secondary | ICD-10-CM | POA: Diagnosis not present

## 2019-10-28 DIAGNOSIS — Z992 Dependence on renal dialysis: Secondary | ICD-10-CM | POA: Diagnosis not present

## 2019-10-28 DIAGNOSIS — N186 End stage renal disease: Secondary | ICD-10-CM | POA: Diagnosis not present

## 2019-10-28 DIAGNOSIS — D631 Anemia in chronic kidney disease: Secondary | ICD-10-CM | POA: Diagnosis not present

## 2019-10-28 DIAGNOSIS — D509 Iron deficiency anemia, unspecified: Secondary | ICD-10-CM | POA: Diagnosis not present

## 2019-10-31 DIAGNOSIS — Z992 Dependence on renal dialysis: Secondary | ICD-10-CM | POA: Diagnosis not present

## 2019-10-31 DIAGNOSIS — D631 Anemia in chronic kidney disease: Secondary | ICD-10-CM | POA: Diagnosis not present

## 2019-10-31 DIAGNOSIS — N186 End stage renal disease: Secondary | ICD-10-CM | POA: Diagnosis not present

## 2019-10-31 DIAGNOSIS — N2581 Secondary hyperparathyroidism of renal origin: Secondary | ICD-10-CM | POA: Diagnosis not present

## 2019-10-31 DIAGNOSIS — D509 Iron deficiency anemia, unspecified: Secondary | ICD-10-CM | POA: Diagnosis not present

## 2019-11-02 DIAGNOSIS — N186 End stage renal disease: Secondary | ICD-10-CM | POA: Diagnosis not present

## 2019-11-02 DIAGNOSIS — Z992 Dependence on renal dialysis: Secondary | ICD-10-CM | POA: Diagnosis not present

## 2019-11-02 DIAGNOSIS — E1129 Type 2 diabetes mellitus with other diabetic kidney complication: Secondary | ICD-10-CM | POA: Diagnosis not present

## 2019-11-02 DIAGNOSIS — D509 Iron deficiency anemia, unspecified: Secondary | ICD-10-CM | POA: Diagnosis not present

## 2019-11-02 DIAGNOSIS — I4891 Unspecified atrial fibrillation: Secondary | ICD-10-CM | POA: Diagnosis not present

## 2019-11-02 DIAGNOSIS — N2581 Secondary hyperparathyroidism of renal origin: Secondary | ICD-10-CM | POA: Diagnosis not present

## 2019-11-02 DIAGNOSIS — D631 Anemia in chronic kidney disease: Secondary | ICD-10-CM | POA: Diagnosis not present

## 2019-11-02 LAB — POCT INR: INR: 1.8 — AB (ref 2.0–3.0)

## 2019-11-03 ENCOUNTER — Encounter: Payer: Medicare Other | Admitting: Internal Medicine

## 2019-11-04 ENCOUNTER — Encounter: Payer: Self-pay | Admitting: Pharmacist

## 2019-11-04 DIAGNOSIS — I480 Paroxysmal atrial fibrillation: Secondary | ICD-10-CM

## 2019-11-04 DIAGNOSIS — N186 End stage renal disease: Secondary | ICD-10-CM | POA: Diagnosis not present

## 2019-11-04 DIAGNOSIS — N2581 Secondary hyperparathyroidism of renal origin: Secondary | ICD-10-CM | POA: Diagnosis not present

## 2019-11-04 DIAGNOSIS — D509 Iron deficiency anemia, unspecified: Secondary | ICD-10-CM | POA: Diagnosis not present

## 2019-11-04 DIAGNOSIS — D631 Anemia in chronic kidney disease: Secondary | ICD-10-CM | POA: Diagnosis not present

## 2019-11-04 DIAGNOSIS — Z992 Dependence on renal dialysis: Secondary | ICD-10-CM | POA: Diagnosis not present

## 2019-11-04 NOTE — Progress Notes (Signed)
This encounter was created in error - please disregard.

## 2019-11-07 DIAGNOSIS — D631 Anemia in chronic kidney disease: Secondary | ICD-10-CM | POA: Diagnosis not present

## 2019-11-07 DIAGNOSIS — N186 End stage renal disease: Secondary | ICD-10-CM | POA: Diagnosis not present

## 2019-11-07 DIAGNOSIS — D509 Iron deficiency anemia, unspecified: Secondary | ICD-10-CM | POA: Diagnosis not present

## 2019-11-07 DIAGNOSIS — N2581 Secondary hyperparathyroidism of renal origin: Secondary | ICD-10-CM | POA: Diagnosis not present

## 2019-11-07 DIAGNOSIS — Z992 Dependence on renal dialysis: Secondary | ICD-10-CM | POA: Diagnosis not present

## 2019-11-09 DIAGNOSIS — D509 Iron deficiency anemia, unspecified: Secondary | ICD-10-CM | POA: Diagnosis not present

## 2019-11-09 DIAGNOSIS — Z992 Dependence on renal dialysis: Secondary | ICD-10-CM | POA: Diagnosis not present

## 2019-11-09 DIAGNOSIS — N2581 Secondary hyperparathyroidism of renal origin: Secondary | ICD-10-CM | POA: Diagnosis not present

## 2019-11-09 DIAGNOSIS — N186 End stage renal disease: Secondary | ICD-10-CM | POA: Diagnosis not present

## 2019-11-09 DIAGNOSIS — D631 Anemia in chronic kidney disease: Secondary | ICD-10-CM | POA: Diagnosis not present

## 2019-11-11 DIAGNOSIS — Z992 Dependence on renal dialysis: Secondary | ICD-10-CM | POA: Diagnosis not present

## 2019-11-11 DIAGNOSIS — N186 End stage renal disease: Secondary | ICD-10-CM | POA: Diagnosis not present

## 2019-11-11 DIAGNOSIS — D631 Anemia in chronic kidney disease: Secondary | ICD-10-CM | POA: Diagnosis not present

## 2019-11-11 DIAGNOSIS — D509 Iron deficiency anemia, unspecified: Secondary | ICD-10-CM | POA: Diagnosis not present

## 2019-11-11 DIAGNOSIS — N2581 Secondary hyperparathyroidism of renal origin: Secondary | ICD-10-CM | POA: Diagnosis not present

## 2019-11-14 DIAGNOSIS — D631 Anemia in chronic kidney disease: Secondary | ICD-10-CM | POA: Diagnosis not present

## 2019-11-14 DIAGNOSIS — Z992 Dependence on renal dialysis: Secondary | ICD-10-CM | POA: Diagnosis not present

## 2019-11-14 DIAGNOSIS — N2581 Secondary hyperparathyroidism of renal origin: Secondary | ICD-10-CM | POA: Diagnosis not present

## 2019-11-14 DIAGNOSIS — N186 End stage renal disease: Secondary | ICD-10-CM | POA: Diagnosis not present

## 2019-11-14 DIAGNOSIS — D509 Iron deficiency anemia, unspecified: Secondary | ICD-10-CM | POA: Diagnosis not present

## 2019-11-15 ENCOUNTER — Telehealth: Payer: Self-pay | Admitting: Pharmacist

## 2019-11-15 NOTE — Telephone Encounter (Signed)
Monitoring pt for INR. Med list reviewed and updated. Pt was recently seen on 3/18 where per Dr. Einar Gip approval, pt was approved for remote INR monitoring through the dialysis lab. However, follow up INR results for 1 month follow up was resulted 5 days later. The repeat INR result was transmitted 4 days later. Per Mychart note form 4/19, Dr. Einar Gip states that he would not be able to continue pt's warfarin management if pt continues to utilize the dialysis lab for her INR management. Called pt to discuss Dr. Irven Shelling instruction. Pt stated that she will start returning to the office to get her INR checked moving forward. Pt has a colonoscopy scheduled for 4/29 and was instructed to hold her warfarin for 7 days prior. Reviewed hold directions with the pt. Informed pt to ask the surgeon when she could restart her warfarin. Rescheduled pt's INR appt for 5/4 for follow up INR check. Pt denies any recent episode of bleeding or bruising that is unusual for her. No change recommended at this time. Will follow up with INR results next Tuesday and continue INR management in clinic moving forward.

## 2019-11-16 DIAGNOSIS — D509 Iron deficiency anemia, unspecified: Secondary | ICD-10-CM | POA: Diagnosis not present

## 2019-11-16 DIAGNOSIS — N186 End stage renal disease: Secondary | ICD-10-CM | POA: Diagnosis not present

## 2019-11-16 DIAGNOSIS — Z992 Dependence on renal dialysis: Secondary | ICD-10-CM | POA: Diagnosis not present

## 2019-11-16 DIAGNOSIS — D631 Anemia in chronic kidney disease: Secondary | ICD-10-CM | POA: Diagnosis not present

## 2019-11-16 DIAGNOSIS — N2581 Secondary hyperparathyroidism of renal origin: Secondary | ICD-10-CM | POA: Diagnosis not present

## 2019-11-17 ENCOUNTER — Other Ambulatory Visit: Payer: Self-pay

## 2019-11-17 ENCOUNTER — Encounter: Payer: Self-pay | Admitting: Internal Medicine

## 2019-11-17 ENCOUNTER — Ambulatory Visit (AMBULATORY_SURGERY_CENTER): Payer: Medicare Other | Admitting: Internal Medicine

## 2019-11-17 VITALS — BP 155/42 | HR 91 | Temp 97.3°F | Resp 13 | Ht 65.0 in | Wt 232.0 lb

## 2019-11-17 DIAGNOSIS — D120 Benign neoplasm of cecum: Secondary | ICD-10-CM | POA: Diagnosis not present

## 2019-11-17 DIAGNOSIS — K219 Gastro-esophageal reflux disease without esophagitis: Secondary | ICD-10-CM | POA: Diagnosis not present

## 2019-11-17 DIAGNOSIS — K2990 Gastroduodenitis, unspecified, without bleeding: Secondary | ICD-10-CM

## 2019-11-17 DIAGNOSIS — D123 Benign neoplasm of transverse colon: Secondary | ICD-10-CM

## 2019-11-17 DIAGNOSIS — K2970 Gastritis, unspecified, without bleeding: Secondary | ICD-10-CM

## 2019-11-17 DIAGNOSIS — K3189 Other diseases of stomach and duodenum: Secondary | ICD-10-CM | POA: Diagnosis not present

## 2019-11-17 DIAGNOSIS — Z86010 Personal history of colonic polyps: Secondary | ICD-10-CM | POA: Diagnosis not present

## 2019-11-17 DIAGNOSIS — D122 Benign neoplasm of ascending colon: Secondary | ICD-10-CM

## 2019-11-17 DIAGNOSIS — D132 Benign neoplasm of duodenum: Secondary | ICD-10-CM | POA: Diagnosis not present

## 2019-11-17 MED ORDER — SODIUM CHLORIDE 0.9 % IV SOLN: 500.0000 mL | Freq: Once | INTRAVENOUS | Status: DC

## 2019-11-17 NOTE — Patient Instructions (Addendum)
YOU HAD AN ENDOSCOPIC PROCEDURE TODAY AT [facility]:   Refer to the procedure report that was given to you for any specific questions about what was found during the examination.  If the procedure report does not answer your questions, please call your gastroenterologist to clarify.  If you requested that your care partner not be given the details of your procedure findings, then the procedure report has been included in a sealed envelope for you to review at your convenience later.  YOU SHOULD EXPECT: Some feelings of bloating in the abdomen. Passage of more gas than usual.  Walking can help get rid of the air that was put into your GI tract during the procedure and reduce the bloating. If you had a lower endoscopy (such as a colonoscopy or flexible sigmoidoscopy) you may notice spotting of blood in your stool or on the toilet paper. If you underwent a bowel prep for your procedure, you may not have a normal bowel movement for a few days.  Please Note:  You might notice some irritation and congestion in your nose or some drainage.  This is from the oxygen used during your procedure.  There is no need for concern and it should clear up in a day or so.  SYMPTOMS TO REPORT IMMEDIATELY:   Following lower endoscopy (colonoscopy or flexible sigmoidoscopy):  Excessive amounts of blood in the stool  Significant tenderness or worsening of abdominal pains  Swelling of the abdomen that is new, acute  Fever of 100F or higher   Following upper endoscopy (EGD)  Vomiting of blood or coffee ground material  New chest pain or pain under the shoulder blades  Painful or persistently difficult swallowing  New shortness of breath  Fever of 100F or higher  Black, tarry-looking stools  For urgent or emergent issues, a gastroenterologist can be reached at any hour by calling [phone]. Do not use MyChart messaging for urgent concerns.    DIET:  We do recommend a small meal at first, but  then you may proceed to your regular diet.  Drink plenty of fluids but you should avoid alcoholic beverages for 24 hours.  MEDICATIONS: Continue present medications. Resume Coumadin (warfarin) at prior dose in 2 days. Refer to managing physician for further adjustment of therapy.  FOLLOW UP with Dr. Hilarie Fredrickson based on pathology results.  Please see handouts given to you by your recovery nurse.  ACTIVITY:  You should plan to take it easy for the rest of today and you should NOT DRIVE or use heavy machinery until tomorrow (because of the sedation medicines used during the test).    FOLLOW UP: Our staff will call the number listed on your records 48-72 hours following your procedure to check on you and address any questions or concerns that you may have regarding the information given to you following your procedure. If we do not reach you, we will leave a message.  We will attempt to reach you two times.  During this call, we will ask if you have developed any symptoms of COVID 19. If you develop any symptoms (ie: fever, flu-like symptoms, shortness of breath, cough etc.) before then, please call [phone].  If you test positive for Covid 19 in the 2 weeks post procedure, please call and report this information to [place].    If any biopsies were taken you will be contacted by phone or by letter within the next 1-3 weeks.  Please call us at [phone] if  you have not heard about the biopsies in 3 weeks.   Thank you for allowing [place] to provide for your healthcare needs today.   SIGNATURES/CONFIDENTIALITY: You and/or your care partner have signed paperwork which will be entered into your electronic medical record.  These signatures attest to the fact that that the information above on your After Visit Summary has been reviewed and is understood.  Full responsibility of the confidentiality of this discharge information lies with you and/or your care-partner.

## 2019-11-17 NOTE — Op Note (Signed)
[place] Patient Name: Jane [name] Procedure Date: 11/17/2019 2:05 PM MRN: [id number] Endoscopist: Jerene Bears , MD Age: 74 Referring MD:  Date of Birth: 05/03/1946 Gender: Female Account #: [id number] Procedure:                Colonoscopy Indications:              High risk colon cancer surveillance: Personal                            history of adenoma with villous component, Last                            colonoscopy: March 2017 at [facility] history of                            sigmoidectomy in Dec 2017 Medicines:                Monitored Anesthesia Care Procedure:                Pre-Anesthesia Assessment:                           - Prior to the procedure, a History and Physical                            was performed, and patient medications and                            allergies were reviewed. The patient's tolerance of                            previous anesthesia was also reviewed. The risks                            and benefits of the procedure and the sedation                            options and risks were discussed with the patient.                            All questions were answered, and informed consent                            was obtained. Prior Anticoagulants: The patient has                            taken Coumadin (warfarin), last dose was 8 days                            prior to procedure. ASA Grade Assessment: III - A                            patient with severe systemic disease. After  reviewing the risks and benefits, the patient was                            deemed in satisfactory condition to undergo the                            procedure.                           After obtaining informed consent, the colonoscope                            was passed under direct vision. Throughout the                            procedure, the patient's blood pressure, pulse, and                            oxygen  saturations were monitored continuously. The                            Colonoscope was introduced through the anus and                            advanced to the cecum, identified by appendiceal                            orifice and ileocecal valve. The colonoscopy was                            performed without difficulty. The patient tolerated                            the procedure well. The quality of the bowel                            preparation was good. The ileocecal valve,                            appendiceal orifice, and rectum were photographed. Scope In: 2:32:07 PM Scope Out: 2:58:58 PM Scope Withdrawal Time: 0 hours 24 minutes 4 seconds  Total Procedure Duration: 0 hours 26 minutes 51 seconds  Findings:                 The digital rectal exam was normal.                           A 10 mm polyp was found in the cecum on the                            proximal lip of the ICV. The polyp was sessile. The                            polyp was removed with a cold snare. Resection  and                            retrieval were complete.                           A 20 mm polyp was found in the ascending colon. The                            polyp was semi-pedunculated. The polyp was removed                            with a hot snare. Resection and retrieval were                            complete.                           Five sessile polyps were found in the ascending                            colon. The polyps were 5 to 10 mm in size. These                            polyps were removed with a cold snare. Resection                            and retrieval were complete.                           Two sessile polyps were found in the transverse                            colon. The polyps were 5 to 7 mm in size. These                            polyps were removed with a cold snare. Resection                            and retrieval were complete.                            There was evidence of a prior end-to-side                            colo-colonic anastomosis in the sigmoid colon. This                            was patent and was characterized by healthy                            appearing mucosa.                           Internal hemorrhoids  were found during                            retroflexion. The hemorrhoids were small. Complications:            No immediate complications. Estimated Blood Loss:     Estimated blood loss was minimal. Impression:               - One 10 mm polyp in the cecum, removed with a cold                            snare. Resected and retrieved.                           - One 20 mm polyp in the ascending colon, removed                            with a hot snare. Resected and retrieved.                           - Five 5 to 10 mm polyps in the ascending colon,                            removed with a cold snare. Resected and retrieved.                           - Two 5 to 7 mm polyps in the transverse colon,                            removed with a cold snare. Resected and retrieved.                           - Patent end-to-side colo-colonic anastomosis,                            characterized by healthy appearing mucosa.                           - Internal hemorrhoids. Recommendation:           - Patient has a contact number available for                            emergencies. The signs and symptoms of potential                            delayed complications were discussed with the                            patient. Return to normal activities tomorrow.                            Written discharge instructions were provided to the  patient.                           - Resume previous diet.                           - Continue present medications.                           - Await pathology results.                           - Repeat colonoscopy is recommended for                             surveillance. The colonoscopy date will be                            determined after pathology results from today's                            exam become available for review.                           - Resume Coumadin (warfarin) at prior dose in 2                            days. Refer to managing physician for further                            adjustment of therapy. Jerene Bears, MD 11/17/2019 3:26:07 PM This report has been signed electronically.

## 2019-11-17 NOTE — Progress Notes (Signed)
To PACU, VSS. Report to [email or web address] 

## 2019-11-17 NOTE — Progress Notes (Signed)
[facility] Temp-JC  Pt's states no medical or surgical changes since previsit or office visit.

## 2019-11-17 NOTE — Progress Notes (Signed)
Called to room to assist during endoscopic procedure.  Patient ID and intended procedure confirmed with present staff. Received instructions for my participation in the procedure from the performing physician.  

## 2019-11-17 NOTE — Op Note (Signed)
[facility] Patient Name: Jane [name] Procedure Date: 11/17/2019 2:05 PM MRN: [id number] Endoscopist: Jerene Bears , MD Age: 74 Referring MD:  Date of Birth: [birth year] Gender: Female Account #: [id number] Procedure:                Upper GI endoscopy Indications:              Indigestion, Gastro-esophageal reflux disease,                            Nausea Medicines:                Monitored Anesthesia Care Procedure:                Pre-Anesthesia Assessment:                           - Prior to the procedure, a History and Physical                            was performed, and patient medications and                            allergies were reviewed. The patient's tolerance of                            previous anesthesia was also reviewed. The risks                            and benefits of the procedure and the sedation                            options and risks were discussed with the patient.                            All questions were answered, and informed consent                            was obtained. Prior Anticoagulants: The patient has                            taken Coumadin (warfarin), last dose was 8 days                            prior to procedure. ASA Grade Assessment: III - A                            patient with severe systemic disease. After                            reviewing the risks and benefits, the patient was                            deemed in satisfactory condition to undergo the  procedure.                           After obtaining informed consent, the endoscope was                            passed under direct vision. Throughout the                            procedure, the patient's blood pressure, pulse, and                            oxygen saturations were monitored continuously. The                            Endoscope was introduced through the mouth, and                            advanced to the  second part of duodenum. The upper                            GI endoscopy was accomplished without difficulty.                            The patient tolerated the procedure well. Scope In: Scope Out: Findings:                 The examined esophagus was normal.                           A single 15 mm pedunculated polyp with no bleeding                            though adherent heme was found in the gastric body.                            Hot snare considered, but with duodenal mass and                            expectation of EMR, the resection not performed                            today. This can be removed in the future in the                            outpatient hospital setting.                           A few small sessile polyps were found in the                            gastric body.                           Mild inflammation characterized by erythema was  found in the gastric antrum. Biopsies were taken                            with a cold forceps for histology and Helicobacter                            pylori testing.                           A large frond-like/villous mass with no bleeding                            was found in the second portion of the duodenum.                            This lesion is 4-5 cm. This also is distal to the                            ampulla. Biopsies were taken with a cold forceps                            for histology. Complications:            No immediate complications. Estimated Blood Loss:     Estimated blood loss was minimal. Impression:               - Normal esophagus.                           - A single pedunculated gastric polyp, with a few                            other much smaller, sessile gastric polyps.                           - Gastritis. Biopsied.                           - Duodenal mass. Biopsied. Recommendation:           - Patient has a contact number available for                             emergencies. The signs and symptoms of potential                            delayed complications were discussed with the                            patient. Return to normal activities tomorrow.                            Written discharge instructions were provided to the                            patient.                           -  Resume previous diet.                           - Continue present medications.                           - Await pathology results.                           - Decision on how best to manage the duodenal mass                            will be based on pathology results. This lesion was                            not mentioned on recent contrasted CT of the                            abd/pelvis.                           - See the other procedure note for documentation of                            additional recommendations. Jerene Bears, MD 11/17/2019 3:19:06 PM This report has been signed electronically.

## 2019-11-18 ENCOUNTER — Telehealth: Payer: Self-pay | Admitting: Internal Medicine

## 2019-11-18 DIAGNOSIS — N186 End stage renal disease: Secondary | ICD-10-CM | POA: Diagnosis not present

## 2019-11-18 DIAGNOSIS — I480 Paroxysmal atrial fibrillation: Secondary | ICD-10-CM | POA: Diagnosis not present

## 2019-11-18 DIAGNOSIS — D509 Iron deficiency anemia, unspecified: Secondary | ICD-10-CM | POA: Diagnosis not present

## 2019-11-18 DIAGNOSIS — N2581 Secondary hyperparathyroidism of renal origin: Secondary | ICD-10-CM | POA: Diagnosis not present

## 2019-11-18 DIAGNOSIS — Z992 Dependence on renal dialysis: Secondary | ICD-10-CM | POA: Diagnosis not present

## 2019-11-18 DIAGNOSIS — D631 Anemia in chronic kidney disease: Secondary | ICD-10-CM | POA: Diagnosis not present

## 2019-11-18 DIAGNOSIS — I10 Essential (primary) hypertension: Secondary | ICD-10-CM | POA: Diagnosis not present

## 2019-11-18 MED ORDER — GUAIFENESIN-CODEINE 100-10 MG/5ML PO SOLN: 10.0000 mL | Freq: Four times a day (QID) | ORAL | 0 refills | Status: DC | PRN

## 2019-11-18 NOTE — Telephone Encounter (Signed)
I sent a prescription to her pharmacy for some Robitussin with low-dose codeine cough medicine.  Instructions are on the bottle.  I expect this will settle down in the next couple of days, and this medicine should help to relieve symptoms over the weekend.  It would be best not to operate a motor vehicle over the weekend if she takes this medicine, as it can sometimes be sedating.

## 2019-11-18 NOTE — Telephone Encounter (Signed)
Pt called back after reporting c/o chest discomfort when she coughs and a metallic taste in her mouth. She is coughing less then yesterday and her chest only hurts "when I cough really hard." She rates it an 8/10 when she coughs. She is only eating soft foods; oatmeal etc. Will notify Dr. Hilarie Fredrickson.

## 2019-11-18 NOTE — Telephone Encounter (Signed)
Call placed to pt. To inform her of the new rx. That Dr. Loletha Carrow sent in for cough. She stated "ok" I will pick up from pharmacy."

## 2019-11-19 DIAGNOSIS — Z992 Dependence on renal dialysis: Secondary | ICD-10-CM | POA: Diagnosis not present

## 2019-11-19 DIAGNOSIS — N186 End stage renal disease: Secondary | ICD-10-CM | POA: Diagnosis not present

## 2019-11-19 DIAGNOSIS — I158 Other secondary hypertension: Secondary | ICD-10-CM | POA: Diagnosis not present

## 2019-11-21 ENCOUNTER — Other Ambulatory Visit: Payer: Self-pay

## 2019-11-21 ENCOUNTER — Telehealth: Payer: Self-pay

## 2019-11-21 ENCOUNTER — Ambulatory Visit (INDEPENDENT_AMBULATORY_CARE_PROVIDER_SITE_OTHER)
Admission: RE | Admit: 2019-11-21 | Discharge: 2019-11-21 | Disposition: A | Discharge status: Home / Self Care | Payer: Medicare Other | Source: Ambulatory Visit | Attending: Internal Medicine | Admitting: Internal Medicine

## 2019-11-21 ENCOUNTER — Other Ambulatory Visit (INDEPENDENT_AMBULATORY_CARE_PROVIDER_SITE_OTHER): Payer: Medicare Other

## 2019-11-21 DIAGNOSIS — D631 Anemia in chronic kidney disease: Secondary | ICD-10-CM | POA: Diagnosis not present

## 2019-11-21 DIAGNOSIS — R1030 Lower abdominal pain, unspecified: Secondary | ICD-10-CM

## 2019-11-21 DIAGNOSIS — D509 Iron deficiency anemia, unspecified: Secondary | ICD-10-CM | POA: Diagnosis not present

## 2019-11-21 DIAGNOSIS — Z992 Dependence on renal dialysis: Secondary | ICD-10-CM | POA: Diagnosis not present

## 2019-11-21 DIAGNOSIS — K219 Gastro-esophageal reflux disease without esophagitis: Secondary | ICD-10-CM | POA: Diagnosis not present

## 2019-11-21 DIAGNOSIS — N2581 Secondary hyperparathyroidism of renal origin: Secondary | ICD-10-CM | POA: Diagnosis not present

## 2019-11-21 DIAGNOSIS — N186 End stage renal disease: Secondary | ICD-10-CM | POA: Diagnosis not present

## 2019-11-21 DIAGNOSIS — R05 Cough: Secondary | ICD-10-CM | POA: Diagnosis not present

## 2019-11-21 LAB — COMPREHENSIVE METABOLIC PANEL
ALT: 14 U/L (ref 0–35)
AST: 19 U/L (ref 0–37)
Albumin: 4 g/dL (ref 3.5–5.2)
Alkaline Phosphatase: 133 U/L — ABNORMAL HIGH (ref 39–117)
BUN: 13 mg/dL (ref 6–23)
CO2: 33 mEq/L — ABNORMAL HIGH (ref 19–32)
Calcium: 8.7 mg/dL (ref 8.4–10.5)
Chloride: 95 mEq/L — ABNORMAL LOW (ref 96–112)
Creatinine, Ser: 5.64 mg/dL (ref 0.40–1.20)
GFR: 8.87 mL/min — CL (ref >60.00)
Glucose, Bld: 100 mg/dL — ABNORMAL HIGH (ref 70–99)
Potassium: 3.5 mEq/L (ref 3.5–5.1)
Sodium: 139 mEq/L (ref 135–145)
Total Bilirubin: 0.5 mg/dL (ref 0.2–1.2)
Total Protein: 7.9 g/dL (ref 6.0–8.3)

## 2019-11-21 LAB — CBC WITH DIFFERENTIAL/PLATELET
Basophils Absolute: 0.1 10*3/uL (ref 0.0–0.1)
Basophils Relative: 0.8 % (ref 0.0–3.0)
Eosinophils Absolute: 0.7 10*3/uL (ref 0.0–0.7)
Eosinophils Relative: 8.9 % — ABNORMAL HIGH (ref 0.0–5.0)
HCT: 30.9 % — ABNORMAL LOW (ref 36.0–46.0)
Hemoglobin: 10.1 g/dL — ABNORMAL LOW (ref 12.0–15.0)
Lymphocytes Relative: 17.8 % (ref 12.0–46.0)
Lymphs Abs: 1.3 10*3/uL (ref 0.7–4.0)
MCHC: 32.6 g/dL (ref 30.0–36.0)
MCV: 89.1 fl (ref 78.0–100.0)
Monocytes Absolute: 0.9 10*3/uL (ref 0.1–1.0)
Monocytes Relative: 11.9 % (ref 3.0–12.0)
Neutro Abs: 4.5 10*3/uL (ref 1.4–7.7)
Neutrophils Relative %: 60.6 % (ref 43.0–77.0)
Platelets: 203 10*3/uL (ref 150.0–400.0)
RBC: 3.47 Mil/uL — ABNORMAL LOW (ref 3.87–5.11)
RDW: 16.4 % — ABNORMAL HIGH (ref 11.5–15.5)
WBC: 7.5 10*3/uL (ref 4.0–10.5)

## 2019-11-21 NOTE — Telephone Encounter (Signed)
Patient needs to come for a 2 view chest x-ray, PA and lateral along with a 2 view abdomen x-ray Recent upper endoscopy and colonoscopy; chest pain/shortness of breath/poor appetite She has a known duodenal mass, biopsies are pending CBC, CMP Until I review x-rays focus on hydration and soft foods as tolerated

## 2019-11-21 NOTE — Telephone Encounter (Signed)
  Follow up Call-  Call back number 11/17/2019  Post procedure Call Back phone  # [phone]  Permission to leave phone message Yes  Some recent data might be hidden     Patient questions:  Do you have a fever, pain , or abdominal swelling? No. Pain Score  0 *  Have you tolerated food without any problems? No.  Have you been able to return to your normal activities? No.  Do you have any questions about your discharge instructions: Diet   No. Medications  No. Follow up visit  No.  Do you have questions or concerns about your Care? Yes.    Actions: * If pain score is 4 or above: No action needed, pain <4.  Pt c/o difficulity eating.  She reported Roof of mouth has a cut.  Her lower lip and left lower gum both were very sore but are better now.  I told pt I suspected this was from the bite block that was placed in her mouth to keep her mouth open while she was sedated.  No fever noted.  Pt c/p coughing up deep yellow, sometimes clear thick phlegm.  The Robotussin with codeine did not help.  Pt said she did not have any of these symptoms pre-procedure.  Pt said she went out to eat yesterday and only ate a bite of salad, spoonful of beans and a bite of brisket.  She reported she was unable to tolerate any more food.  She reported she has only consumed water and crackers.  I asked pt if she called the on call doctor and pt said "no".  I did tell her she should have called d/t not being able to eat.  I advised pt I would send this note to Dr. Hilarie Fredrickson and we will return a call to her.     1. Have you developed a fever since your procedure? no  2.   Have you had an respiratory symptoms (SOB or cough) since your procedure? yes  3.   Have you tested positive for COVID 19 since your procedure no  4.   Have you had any family members/close contacts diagnosed with the COVID 19 since your procedure?  no   If yes to any of these questions please route to Joylene John, RN and Erenest Rasher,  RN

## 2019-11-21 NOTE — Telephone Encounter (Signed)
Spoke with pt and she is aware, orders in epic. Pt states she cannot come no as she is on dialysis. States she will try to come late this evening or tomorrow. Orders in epic.

## 2019-11-22 ENCOUNTER — Ambulatory Visit: Payer: Medicare Other | Admitting: Pharmacist

## 2019-11-22 ENCOUNTER — Other Ambulatory Visit: Payer: Self-pay

## 2019-11-22 DIAGNOSIS — Z7901 Long term (current) use of anticoagulants: Secondary | ICD-10-CM | POA: Diagnosis not present

## 2019-11-22 DIAGNOSIS — I480 Paroxysmal atrial fibrillation: Secondary | ICD-10-CM

## 2019-11-22 DIAGNOSIS — Z5181 Encounter for therapeutic drug level monitoring: Secondary | ICD-10-CM | POA: Diagnosis not present

## 2019-11-22 LAB — POCT INR: INR: 1.4 — AB (ref 2.0–3.0)

## 2019-11-22 NOTE — Patient Instructions (Signed)
INR below goal. Take 10 mg today and 7.5 mg tomorrow, then continue taking 7.5 mg every Tues/Thurs and 5 mg all other days. Recheck in 1 week

## 2019-11-22 NOTE — Progress Notes (Signed)
Anticoagulation Management Jane [name] is a 74 y.o. female who reports to the clinic for monitoring of warfarin treatment.    Indication: atrial fibrillation CHA2DS2 Vasc Score 4 (Age >66, female, HTN hx), HAS-BLED 2 (Age>65, renal disease)  Duration: indefinite Supervising physician: Adrian Prows  Anticoagulation Clinic Visit History:  Patient does report signs/symptoms of bleeding or thromboembolism. Complains of blood is phlegm when coughing, mild bleeding from the roof of her mouth and lips post endoscopy procedure.    Other recent changes: No recent diet, medications, lifestyle. Recent endoscopy and colonoscopy on 4/29. Pt held dose 5 days prior and 2 days post surgery. Restarted warfarin on 5/2. Complains of persistent cough, started on robitussin DM that is providing mild relief. Complains of difficulty swallowing. Decreased appetite. Only having fluids and soft food.   Anticoagulation Episode Summary    Current INR goal:  2.0-3.0  TTR:  79.4 % (1.2 y)  Next INR check:  11/29/2019  INR from last check:  1.4 (11/22/2019)  Weekly max warfarin dose:    Target end date:  Indefinite  INR check location:    Preferred lab:    Send INR reminders to:     Indications   Paroxysmal atrial fibrillation (HCC) [I48.0]       Comments:          Allergies  Allergen Reactions  . Latex Rash  . Other Other (See Comments)    The plastic tape irritates her skin.  Silicone tapes cause redness  . Penicillins Rash  . Sulfa Antibiotics Rash  . Tape Other (See Comments)    PLASTIC TAPE TEARS OFF THE SKIN AND BRUISES IT TERRIBLY!! GETS ITCHY AND RASH FROM PAPER AND PLASTIC TAPES PLASTIC TAPE TEARS OFF THE SKIN AND BRUISES IT TERRIBLY!! PLASTIC TAPE TEARS OFF THE SKIN AND BRUISES IT TERRIBLY!!    Current Outpatient Medications:  .  albuterol (PROVENTIL HFA;VENTOLIN HFA) 108 (90 Base) MCG/ACT inhaler, Inhale 1 puff into the lungs every 6 (six) hours as needed for wheezing or shortness of breath.,  Disp: 6.7 g, Rfl: 2 .  Amino Acids-Protein Hydrolys (FEEDING SUPPLEMENT, PRO-STAT SUGAR FREE 64,) LIQD, Take 30 mLs by mouth 2 (two) times daily. (Patient not taking: Reported on 11/15/2019), Disp: 900 mL, Rfl: 0 .  carvedilol (COREG) 12.5 MG tablet, Take 1 tablet (12.5 mg total) by mouth as needed (if BP is above 170). (Patient not taking: Reported on 11/17/2019), Disp: 60 tablet, Rfl: 3 .  cinacalcet (SENSIPAR) 60 MG tablet, Take 60 mg by mouth every other day. Taking Mon, Wed, Fri, Disp: , Rfl:  .  CONSTULOSE 10 GM/15ML solution, Taking 1 tablespoon TID PRN for constipation, Disp: , Rfl:  .  ethyl chloride spray, USE 1 TO 2 SPRAYS TO DIALYSIS ACCESS EVERY TREATMENT, Disp: , Rfl: 12 .  fludrocortisone (FLORINEF) 0.1 MG tablet, TAKE 1 TABLET BY MOUTH EVERY OTHER DAY, Disp: 30 tablet, Rfl: 0 .  guaiFENesin-codeine 100-10 MG/5ML syrup, Take 10 mLs by mouth every 6 (six) hours as needed for cough., Disp: 118 mL, Rfl: 0 .  Methoxy PEG-Epoetin Beta (MIRCERA IJ), IV with dialysis, Disp: , Rfl:  .  midodrine (PROAMATINE) 10 MG tablet, Take 10 mg by mouth 3 (three) times daily. Taking PRN; atleast once prior dialysis session, Disp: , Rfl:  .  multivitamin (RENA-VIT) TABS tablet, Take 1 tablet by mouth daily., Disp: , Rfl:  .  nystatin cream (MYCOSTATIN), Apply 1 application topically 2 (two) times daily., Disp: 45 g, Rfl: 0 .  omeprazole (PRILOSEC) 20 MG capsule, Take 1 capsule (20 mg total) by mouth in the morning and at bedtime. (Patient not taking: Reported on 11/17/2019), Disp: 60 capsule, Rfl: 2 .  sevelamer carbonate (RENVELA) 800 MG tablet, Take 800-1,600 mg by mouth See admin instructions. 2 tabs 3 times daily, Disp: , Rfl:  .  terconazole (TERAZOL 7) 0.4 % vaginal cream, Place 1 applicator vaginally at bedtime., Disp: 45 g, Rfl: 1 .  triamcinolone cream (KENALOG) 0.1 %, Apply 1 application topically 2 (two) times daily., Disp: 45 g, Rfl: 0 .  warfarin (COUMADIN) 5 MG tablet, TAKE 1 & 1/2 (ONE &  ONE-HALF) TABLETS BY MOUTH EVERY TUESDAY AND EVERY THURSDAY AND 1 TABLET ONCE DAILY ALL OTHER DAYS, Disp: 113 tablet, Rfl: 0 Past Medical History:  Diagnosis Date  . Acid reflux   . Anemia of chronic disease   . Atrial fibrillation ([place])   . Bilateral carotid bruits   . Diverticulitis   . ESRD (end stage renal disease) ([place])   . Hypertension   . Malaise and fatigue   . Orthostatic hypotension   . Shortness of breath   . Sleep apnea   . Syncope   . Tubulovillous adenoma of colon     ASSESSMENT  Recent Results: The most recent result is correlated with 10 mg per week:  Lab Results  Component Value Date   INR 1.4 (A) 11/22/2019   INR 1.8 (A) 11/02/2019   INR 2.2 10/06/2019    Anticoagulation Dosing: Description   INR below goal. Take 10 mg today and 7.5 mg tomorrow, then continue taking 7.5 mg every Tues/Thurs and 5 mg all other days. Recheck in 1 week      INR today: Subtherapeutic likely in the setting of held doses. Recent complains of bleeding incidence post surgery. Will consider boosting today and tomorrow and continuing close monitoring. Pt aware to notify the office if she continues to have excessive bleeding incidence. Will continue close monitoring and follow up.   PLAN Weekly dose was unchanged. Take 10 mg today and 7.5 mg tomorrow and then continue current dose of 7.5 mg every Tues, Thurs and 5 mg all other days. Recheck INR in 1 week.   Patient Instructions  INR below goal. Take 10 mg today and 7.5 mg tomorrow, then continue taking 7.5 mg every Tues/Thurs and 5 mg all other days. Recheck in 1 week  Patient advised to contact clinic or seek medical attention if signs/symptoms of bleeding or thromboembolism occur.  Patient verbalized understanding by repeating back information and was advised to contact me if further medication-related questions arise.   Follow-up Return in about 1 week (around 11/29/2019).  [name], PharmD  15 minutes spent  face-to-face with the patient during the encounter. 50% of time spent on education, including signs/sx bleeding and clotting, as well as food and drug interactions with warfarin. 50% of time was spent on fingerprick POC INR sample collection,processing, results determination, and documentation

## 2019-11-23 DIAGNOSIS — N186 End stage renal disease: Secondary | ICD-10-CM | POA: Diagnosis not present

## 2019-11-23 DIAGNOSIS — Z992 Dependence on renal dialysis: Secondary | ICD-10-CM | POA: Diagnosis not present

## 2019-11-23 DIAGNOSIS — N2581 Secondary hyperparathyroidism of renal origin: Secondary | ICD-10-CM | POA: Diagnosis not present

## 2019-11-23 DIAGNOSIS — D631 Anemia in chronic kidney disease: Secondary | ICD-10-CM | POA: Diagnosis not present

## 2019-11-23 DIAGNOSIS — D509 Iron deficiency anemia, unspecified: Secondary | ICD-10-CM | POA: Diagnosis not present

## 2019-11-25 ENCOUNTER — Other Ambulatory Visit: Payer: Self-pay

## 2019-11-25 DIAGNOSIS — D631 Anemia in chronic kidney disease: Secondary | ICD-10-CM | POA: Diagnosis not present

## 2019-11-25 DIAGNOSIS — N186 End stage renal disease: Secondary | ICD-10-CM | POA: Diagnosis not present

## 2019-11-25 DIAGNOSIS — N2581 Secondary hyperparathyroidism of renal origin: Secondary | ICD-10-CM | POA: Diagnosis not present

## 2019-11-25 DIAGNOSIS — D509 Iron deficiency anemia, unspecified: Secondary | ICD-10-CM | POA: Diagnosis not present

## 2019-11-25 DIAGNOSIS — K3189 Other diseases of stomach and duodenum: Secondary | ICD-10-CM

## 2019-11-25 DIAGNOSIS — Z992 Dependence on renal dialysis: Secondary | ICD-10-CM | POA: Diagnosis not present

## 2019-11-28 DIAGNOSIS — N186 End stage renal disease: Secondary | ICD-10-CM | POA: Diagnosis not present

## 2019-11-28 DIAGNOSIS — D509 Iron deficiency anemia, unspecified: Secondary | ICD-10-CM | POA: Diagnosis not present

## 2019-11-28 DIAGNOSIS — Z992 Dependence on renal dialysis: Secondary | ICD-10-CM | POA: Diagnosis not present

## 2019-11-28 DIAGNOSIS — N2581 Secondary hyperparathyroidism of renal origin: Secondary | ICD-10-CM | POA: Diagnosis not present

## 2019-11-28 DIAGNOSIS — D631 Anemia in chronic kidney disease: Secondary | ICD-10-CM | POA: Diagnosis not present

## 2019-11-29 ENCOUNTER — Other Ambulatory Visit: Payer: Self-pay

## 2019-11-29 ENCOUNTER — Ambulatory Visit: Payer: Medicare Other | Admitting: Pharmacist

## 2019-11-29 DIAGNOSIS — Z7901 Long term (current) use of anticoagulants: Secondary | ICD-10-CM | POA: Diagnosis not present

## 2019-11-29 DIAGNOSIS — Z5181 Encounter for therapeutic drug level monitoring: Secondary | ICD-10-CM | POA: Diagnosis not present

## 2019-11-29 DIAGNOSIS — I480 Paroxysmal atrial fibrillation: Secondary | ICD-10-CM

## 2019-11-29 LAB — POCT INR: INR: 1.6 — AB (ref 2.0–3.0)

## 2019-11-29 NOTE — Patient Instructions (Signed)
INR below goal. Take 7.5 mg today then increase dose to 7.5 mg every Mon, Wed, Fri and 5 mg all other days. Recheck in 1 week

## 2019-11-29 NOTE — Progress Notes (Signed)
Anticoagulation Management Jane [name] is a 74 y.o. female who reports to the clinic for monitoring of warfarin treatment.    Indication: atrial fibrillation CHA2DS2 Vasc Score 4 (Age >28, female, HTN hx), HAS-BLED 2 (Age>65, renal disease)  Duration: indefinite Supervising physician: Adrian Prows  Anticoagulation Clinic Visit History:  Patient does report signs/symptoms of bleeding or thromboembolism. No recurrent bleeding episodes since last visit. Denies blood in phlegm, lip or gum bleeds since last check.   Other recent changes: No recent diet, medications, lifestyle. Pt reports her swallowing difficulty has improved since last week and is able to return back to her normal diet. Able to eat and tolerate solid foods. Pt complaining on increased fatigue recently.   Pt is scheduled for a follow up appt this Thursday regarding biopsy results.  Anticoagulation Episode Summary    Current INR goal:  2.0-3.0  TTR:  79.4 % (1.2 y)  Next INR check:  11/29/2019  INR from last check:  1.4 (11/22/2019)  Weekly max warfarin dose:    Target end date:  Indefinite  INR check location:    Preferred lab:    Send INR reminders to:     Indications   Paroxysmal atrial fibrillation (HCC) [I48.0]       Comments:          Allergies  Allergen Reactions  . Latex Rash  . Other Other (See Comments)    The plastic tape irritates her skin.  Silicone tapes cause redness  . Penicillins Rash  . Sulfa Antibiotics Rash  . Tape Other (See Comments)    PLASTIC TAPE TEARS OFF THE SKIN AND BRUISES IT TERRIBLY!! GETS ITCHY AND RASH FROM PAPER AND PLASTIC TAPES PLASTIC TAPE TEARS OFF THE SKIN AND BRUISES IT TERRIBLY!! PLASTIC TAPE TEARS OFF THE SKIN AND BRUISES IT TERRIBLY!!    Current Outpatient Medications:  .  albuterol (PROVENTIL HFA;VENTOLIN HFA) 108 (90 Base) MCG/ACT inhaler, Inhale 1 puff into the lungs every 6 (six) hours as needed for wheezing or shortness of breath., Disp: 6.7 g, Rfl: 2 .  Amino  Acids-Protein Hydrolys (FEEDING SUPPLEMENT, PRO-STAT SUGAR FREE 64,) LIQD, Take 30 mLs by mouth 2 (two) times daily. (Patient not taking: Reported on 11/15/2019), Disp: 900 mL, Rfl: 0 .  carvedilol (COREG) 12.5 MG tablet, Take 1 tablet (12.5 mg total) by mouth as needed (if BP is above 170). (Patient not taking: Reported on 11/17/2019), Disp: 60 tablet, Rfl: 3 .  cinacalcet (SENSIPAR) 60 MG tablet, Take 60 mg by mouth every other day. Taking Mon, Wed, Fri, Disp: , Rfl:  .  CONSTULOSE 10 GM/15ML solution, Taking 1 tablespoon TID PRN for constipation, Disp: , Rfl:  .  ethyl chloride spray, USE 1 TO 2 SPRAYS TO DIALYSIS ACCESS EVERY TREATMENT, Disp: , Rfl: 12 .  fludrocortisone (FLORINEF) 0.1 MG tablet, TAKE 1 TABLET BY MOUTH EVERY OTHER DAY, Disp: 30 tablet, Rfl: 0 .  guaiFENesin-codeine 100-10 MG/5ML syrup, Take 10 mLs by mouth every 6 (six) hours as needed for cough., Disp: 118 mL, Rfl: 0 .  Methoxy PEG-Epoetin Beta (MIRCERA IJ), IV with dialysis, Disp: , Rfl:  .  midodrine (PROAMATINE) 10 MG tablet, Take 10 mg by mouth 3 (three) times daily. Taking PRN; atleast once prior dialysis session, Disp: , Rfl:  .  multivitamin (RENA-VIT) TABS tablet, Take 1 tablet by mouth daily., Disp: , Rfl:  .  nystatin cream (MYCOSTATIN), Apply 1 application topically 2 (two) times daily., Disp: 45 g, Rfl: 0 .  omeprazole (PRILOSEC)  20 MG capsule, Take 1 capsule (20 mg total) by mouth in the morning and at bedtime. (Patient not taking: Reported on 11/17/2019), Disp: 60 capsule, Rfl: 2 .  sevelamer carbonate (RENVELA) 800 MG tablet, Take 800-1,600 mg by mouth See admin instructions. 2 tabs 3 times daily, Disp: , Rfl:  .  terconazole (TERAZOL 7) 0.4 % vaginal cream, Place 1 applicator vaginally at bedtime., Disp: 45 g, Rfl: 1 .  triamcinolone cream (KENALOG) 0.1 %, Apply 1 application topically 2 (two) times daily., Disp: 45 g, Rfl: 0 .  warfarin (COUMADIN) 5 MG tablet, TAKE 1 & 1/2 (ONE & ONE-HALF) TABLETS BY MOUTH EVERY  TUESDAY AND EVERY THURSDAY AND 1 TABLET ONCE DAILY ALL OTHER DAYS, Disp: 113 tablet, Rfl: 0 Past Medical History:  Diagnosis Date  . Acid reflux   . Anemia of chronic disease   . Atrial fibrillation ([place])   . Bilateral carotid bruits   . Diverticulitis   . ESRD (end stage renal disease) ([place])   . Hypertension   . Malaise and fatigue   . Orthostatic hypotension   . Shortness of breath   . Sleep apnea   . Syncope   . Tubulovillous adenoma of colon     ASSESSMENT  Recent Results: The most recent result is correlated with 45 mg per week:  Lab Results  Component Value Date   INR 1.4 (A) 11/22/2019   INR 1.8 (A) 11/02/2019   INR 2.2 10/06/2019    Anticoagulation Dosing: Description   INR below goal. Take 7.5 mg today then increase dose to 7.5 mg every Mon, Wed, Fri and 5 mg all other days. Recheck in 1 week      INR today: Subtherapeutic. Remains subtherapeutic despite boost doses last week. Recent complains of bleeding incidence post surgery. Warrants further dose increase. Will boost today and increase weekly dose by 6.3%. Cautious due to recent episodes of bleeding. Will continue close monitoring and follow up.   PLAN Weekly dose was changed by 6.3%. INR below goal. Take 7.5 mg today then increase dose to 7.5 mg every Mon, Wed, Fri and 5 mg all other days. Recheck in 1 week   Patient Instructions  INR below goal. Take 7.5 mg today then increase dose to 7.5 mg every Mon, Wed, Fri and 5 mg all other days. Recheck in 1 week  Patient advised to contact clinic or seek medical attention if signs/symptoms of bleeding or thromboembolism occur.  Patient verbalized understanding by repeating back information and was advised to contact me if further medication-related questions arise.   Follow-up Return in about 1 week (around 12/08/2019).  [name], PharmD  15 minutes spent face-to-face with the patient during the encounter. 50% of time spent on education, including  signs/sx bleeding and clotting, as well as food and drug interactions with warfarin. 50% of time was spent on fingerprick POC INR sample collection,processing, results determination, and documentation

## 2019-11-30 DIAGNOSIS — D631 Anemia in chronic kidney disease: Secondary | ICD-10-CM | POA: Diagnosis not present

## 2019-11-30 DIAGNOSIS — D509 Iron deficiency anemia, unspecified: Secondary | ICD-10-CM | POA: Diagnosis not present

## 2019-11-30 DIAGNOSIS — N186 End stage renal disease: Secondary | ICD-10-CM | POA: Diagnosis not present

## 2019-11-30 DIAGNOSIS — Z992 Dependence on renal dialysis: Secondary | ICD-10-CM | POA: Diagnosis not present

## 2019-11-30 DIAGNOSIS — N2581 Secondary hyperparathyroidism of renal origin: Secondary | ICD-10-CM | POA: Diagnosis not present

## 2019-11-30 DIAGNOSIS — I4891 Unspecified atrial fibrillation: Secondary | ICD-10-CM | POA: Diagnosis not present

## 2019-12-01 ENCOUNTER — Encounter: Payer: Self-pay | Admitting: Gastroenterology

## 2019-12-01 ENCOUNTER — Ambulatory Visit (INDEPENDENT_AMBULATORY_CARE_PROVIDER_SITE_OTHER): Payer: Medicare Other | Admitting: Gastroenterology

## 2019-12-01 VITALS — BP 164/60 | HR 84 | Temp 97.4°F | Ht 65.0 in | Wt 236.0 lb

## 2019-12-01 DIAGNOSIS — K3189 Other diseases of stomach and duodenum: Secondary | ICD-10-CM | POA: Diagnosis not present

## 2019-12-01 DIAGNOSIS — K317 Polyp of stomach and duodenum: Secondary | ICD-10-CM | POA: Diagnosis not present

## 2019-12-01 DIAGNOSIS — D132 Benign neoplasm of duodenum: Secondary | ICD-10-CM

## 2019-12-01 DIAGNOSIS — D1330 Benign neoplasm of unspecified part of small intestine: Secondary | ICD-10-CM

## 2019-12-01 DIAGNOSIS — K31A0 Gastric intestinal metaplasia, unspecified: Secondary | ICD-10-CM

## 2019-12-01 DIAGNOSIS — R933 Abnormal findings on diagnostic imaging of other parts of digestive tract: Secondary | ICD-10-CM

## 2019-12-01 NOTE — Patient Instructions (Addendum)
If you are age 76 or older, your body mass index should be between 23-30. Your Body mass index is 39.27 kg/m. If this is out of the aforementioned range listed, please consider follow up with your Primary Care Provider.  If you are age 86 or younger, your body mass index should be between 19-25. Your Body mass index is 39.27 kg/m. If this is out of the aformentioned range listed, please consider follow up with your Primary Care Provider.   You have been scheduled for an endoscopy. Please follow written instructions given to you at your visit today. If you use inhalers (even only as needed), please bring them with you on the day of your procedure.  Please call or my chart office tomorrow to let us know which physician you see at [facility].   We have place a referral to [facility] in the event that Duodenal Polyp can not be captured with endoscopic procedure with Dr. Rush Landmark. Their office will contact you with an appointment if needed.   Thank you for choosing me and [facility].  Dr. Rush Landmark

## 2019-12-01 NOTE — Progress Notes (Signed)
[place] VISIT   Primary Care Provider [name], [name], MD [place] [address] [phone]  Referring Provider Dr. Hilarie Fredrickson  Patient Profile: Jane [name] is a 74 y.o. female with a pmh significant for atrial fibrillation (on Coumadin), end-stage renal disease on HD, hypertension, sleep apnea, diverticulosis with prior diverticulitis, GERD, prior colon polyps, TVA of the duodenum.  The patient presents to the [facility] for an evaluation and management of problem(s) noted below:  Problem List 1. Tubulovillous adenoma of small intestine   2. Adenomatous duodenal polyp   3. Intestinal metaplasia of gastric mucosa   4. Gastric polyp   5. Abnormal endoscopy of upper gastrointestinal tract     History of Present Illness Please see initial consultation note by Dr. Hilarie Fredrickson for full details of HPI.  Interval History The patient underwent an upper and lower endoscopy for further evaluation of constipation with nausea and abdominal pain in the setting of her previous GERD and indigestion.  Endoscopy showed evidence of an inflamed likely hyperplastic gastric polyp some oozing and evidence of recent stigmata of bleeding.  More concerning was a large polypoid mass in the second portion of the duodenum, most likely distal to the ampulla that returned as a tubulovillous adenoma.  CT scan imaging had been performed although it was noncontrast in March and did not show any evidence of a mass or lesion.  It is for this reason that the patient is referred for consideration of advanced resection of this large polypoid duodenal lesion.  The patient is accompanied with a family friend who is a Marine scientist in oncology.  The patient's daughter was not able to come to the clinic visit today.  Patient has not had any significant changes in her symptoms since her clinic visit with Dr. Hilarie Fredrickson.  She is more concerned now with the finding and lesion I would  like to get this taken care of as soon as possible.  She denies any overt melena or hematochezia or maroon stools.  She is not taking significant nonsteroidals or BC/Goody powders.  She goes to dialysis on Monday/Wednesdays/Fridays.  She remains on anticoagulation for her atrial fibrillation.  GI Review of Systems Positive as above Negative for dysphagia, odynophagia, change in bowel habits  Review of Systems General: Denies fevers/chills/weight loss unintentionally Cardiovascular: Denies current chest pain/palpitations Pulmonary: Denies shortness of breath Gastroenterological: See HPI Genitourinary: Denies darkened urine Hematological: Positive for easy bruising/bleeding due to Coumadin Dermatological: Denies jaundice Psychological: Mood is anxious   Medications Current Outpatient Medications  Medication Sig Dispense Refill  . albuterol (PROVENTIL HFA;VENTOLIN HFA) 108 (90 Base) MCG/ACT inhaler Inhale 1 puff into the lungs every 6 (six) hours as needed for wheezing or shortness of breath. 6.7 g 2  . carvedilol (COREG) 12.5 MG tablet Take 1 tablet (12.5 mg total) by mouth as needed (if BP is above 170). 60 tablet 3  . cinacalcet (SENSIPAR) 60 MG tablet Take 60 mg by mouth every other day. Taking Mon, Wed, Fri    . CONSTULOSE 10 GM/15ML solution Taking 1 tablespoon TID PRN for constipation    . ethyl chloride spray USE 1 TO 2 SPRAYS TO DIALYSIS ACCESS EVERY TREATMENT  12  . fludrocortisone (FLORINEF) 0.1 MG tablet TAKE 1 TABLET BY MOUTH EVERY OTHER DAY 30 tablet 0  . guaiFENesin-codeine 100-10 MG/5ML syrup Take 10 mLs by mouth every 6 (six) hours as needed for cough. 118 mL 0  . Methoxy PEG-Epoetin Beta (MIRCERA IJ) IV  with dialysis    . midodrine (PROAMATINE) 10 MG tablet Take 10 mg by mouth 3 (three) times daily. Taking PRN; atleast once prior dialysis session    . multivitamin (RENA-VIT) TABS tablet Take 1 tablet by mouth daily.    Marland Kitchen nystatin cream (MYCOSTATIN) Apply 1 application  topically 2 (two) times daily. 45 g 0  . omeprazole (PRILOSEC) 20 MG capsule Take 1 capsule (20 mg total) by mouth in the morning and at bedtime. 60 capsule 2  . sevelamer carbonate (RENVELA) 800 MG tablet Take 800-1,600 mg by mouth See admin instructions. 2 tabs 3 times daily    . terconazole (TERAZOL 7) 0.4 % vaginal cream Place 1 applicator vaginally at bedtime. 45 g 1  . triamcinolone cream (KENALOG) 0.1 % Apply 1 application topically 2 (two) times daily. 45 g 0  . warfarin (COUMADIN) 5 MG tablet TAKE 1 & 1/2 (ONE & ONE-HALF) TABLETS BY MOUTH EVERY TUESDAY AND EVERY THURSDAY AND 1 TABLET ONCE DAILY ALL OTHER DAYS 113 tablet 0   No current facility-administered medications for this visit.    Allergies Allergies  Allergen Reactions  . Latex Rash  . Other Other (See Comments)    The plastic tape irritates her skin.  Silicone tapes cause redness  . Penicillins Rash  . Sulfa Antibiotics Rash  . Tape Other (See Comments)    PLASTIC TAPE TEARS OFF THE SKIN AND BRUISES IT TERRIBLY!! GETS ITCHY AND RASH FROM PAPER AND PLASTIC TAPES PLASTIC TAPE TEARS OFF THE SKIN AND BRUISES IT TERRIBLY!! PLASTIC TAPE TEARS OFF THE SKIN AND BRUISES IT TERRIBLY!!    Histories Past Medical History:  Diagnosis Date  . Acid reflux   . Anemia of chronic disease   . Atrial fibrillation ([place])   . Bilateral carotid bruits   . Diverticulitis   . ESRD (end stage renal disease) ([place])   . Hypertension   . Malaise and fatigue   . Orthostatic hypotension   . Shortness of breath   . Sleep apnea   . Syncope   . Tubulovillous adenoma of colon    Past Surgical History:  Procedure Laterality Date  . BACK SURGERY    . IR GENERIC HISTORICAL  07/09/2016   IR US GUIDE VASC ACCESS RIGHT 07/09/2016 Arne Cleveland, MD [facility]  . IR GENERIC HISTORICAL  07/09/2016   IR FLUORO GUIDE CV LINE RIGHT 07/09/2016 Arne Cleveland, MD [facility]  . KNEE ARTHROPLASTY Left   . LAPAROSCOPIC SIGMOID COLECTOMY N/A  07/11/2016   Procedure: LAPAROSCOPIC SIGMOID COLECTOMY;  Surgeon: Clovis Riley, MD;  Location: [facility];  Service: General;  Laterality: N/A;  . TUBAL LIGATION     Social History   Socioeconomic History  . Marital status: Divorced    Spouse name: Not on file  . Number of children: 4  . Years of education: 29  . Highest education level: Associate degree: occupational, Hotel manager, or vocational program  Occupational History  . Occupation: Retired  Tobacco Use  . Smoking status: Never Smoker  . Smokeless tobacco: Never Used  Substance and Sexual Activity  . Alcohol use: No  . Drug use: No  . Sexual activity: Never  Other Topics Concern  . Not on file  Social History Narrative   HH 1   Divorced   Outpatient dialysis Mon, Wed, Fri   4 children: 1 daughter locally is an Designer, multimedia and 3 sons in [place]   Social Determinants of Health   Financial Resource Strain: High  Risk  . Difficulty of Paying Living Expenses: Very hard  Food Insecurity: No Food Insecurity  . Worried About Charity fundraiser in the Last Year: Never true  . Ran Out of Food in the Last Year: Never true  Transportation Needs: No Transportation Needs  . Lack of Transportation (Medical): No  . Lack of Transportation (Non-Medical): No  Physical Activity:   . Days of Exercise per Week:   . Minutes of Exercise per Session:   Stress: Stress Concern Present  . Feeling of Stress : Rather much  Social Connections: Slightly Isolated  . Frequency of Communication with Friends and Family: More than three times a week  . Frequency of Social Gatherings with Friends and Family: Once a week  . Attends Religious Services: More than 4 times per year  . Active Member of Clubs or Organizations: Yes  . Attends Archivist Meetings: Not on file  . Marital Status: Divorced  Human resources officer Violence:   . Fear of Current or Ex-Partner:   . Emotionally Abused:   Marland Kitchen Physically Abused:   . Sexually Abused:    Family  History  Problem Relation Age of Onset  . Heart failure Mother   . Stroke Mother   . Other Father   . Colon cancer Neg Hx   . Liver disease Neg Hx   . Esophageal cancer Neg Hx   . Stomach cancer Neg Hx   . Inflammatory bowel disease Neg Hx   . Rectal cancer Neg Hx   . Pancreatic cancer Neg Hx    I have reviewed her medical, social, and family history in detail and updated the electronic medical record as necessary.    PHYSICAL EXAMINATION  BP (!) 164/60   Pulse 84   Temp (!) 97.4 F (36.3 C)   Ht 5' 5" (1.651 m)   Wt 236 lb (107 kg)   BMI 39.27 kg/m  Wt Readings from Last 3 Encounters:  12/01/19 236 lb (107 kg)  11/17/19 232 lb (105.2 kg)  10/06/19 232 lb (105.2 kg)  GEN: NAD, appears stated age, doesn't appear chronically ill, accompanied by family friend PSYCH: Cooperative, without pressured speech EYE: Conjunctivae pink, sclerae anicteric ENT: MMM CV: Nontachycardic RESP: No wheezing apparent GI: NABS, soft, protuberant abdomen, nontender, without rebound or guarding NT/ND, without rebound or guarding MSK/EXT: Bilateral lower extremity edema present SKIN: No jaundice, EURO:  Alert & Oriented x 3, no focal deficits   REVIEW OF DATA  I reviewed the following data at the time of this encounter:  GI Procedures and Studies  November 17, 2019 EGD - Normal esophagus. - A single pedunculated gastric polyp, with a few other much smaller, sessile gastric polyps. - Gastritis. Biopsied. - Duodenal mass. Biopsied. Pathology Diagnosis 1. Surgical [P], 2nd portion of duodenum (mass) - SUPERFICIAL FRAGMENTS OF A TUBULOVILLOUS ADENOMA. - SEE COMMENT. 2. Surgical [P], gastric antrum and gastric body - CHRONIC INACTIVE GASTRITIS WITH FOCAL INTESTINAL METAPLASIA. - THERE IS NO EVIDENCE OF HELICOBACTER PYLORI, DYSPLASIA OR MALIGNANCY. - SEE COMMENT.  Laboratory Studies  Reviewed those in epic  Imaging Studies  March 2021 CT abdomen pelvis with contrast IMPRESSION: 1. No  acute CT findings to explain left lower quadrant abdominal pain. 2. Evidence of prior sigmoid colon resection and reanastomosis. No significant diverticular disease of the remaining colon. 3. Acquired cystic kidney disease, in keeping with renal dialysis. 4. Coronary artery disease. Aortic Atherosclerosis (ICD10-I70.0).   ASSESSMENT  Jane [name] is a 74 y.o. female  with a pmh significant for The patient is seen today for evaluation and management of:  1. Tubulovillous adenoma of small intestine   2. Adenomatous duodenal polyp   3. Intestinal metaplasia of gastric mucosa   4. Gastric polyp   5. Abnormal endoscopy of upper gastrointestinal tract    The patient is hemodynamically stable and clinically unchanged from her evaluation with Dr. Hilarie Fredrickson earlier this year.  Based upon the description and endoscopic pictures I do feel that it is reasonable to pursue an Advanced Polypectomy attempt of the polyp/lesion of the duodenal lesion.  I will also plan to EMR the gastric polyp.  To get the most sense of how the lesion looks, I anticipate also performing or having available an endoscopic ultrasound.  We discussed some of the techniques of advanced polypectomy which include Endoscopic Mucosal Resection, OVESCO Full-Thickness Resection, Endorotor Morcellation, and Tissue Ablation via Fulguration.  The risks and benefits of endoscopic evaluation were discussed with the patient; these include but are not limited to the risk of perforation, infection, bleeding, missed lesions, lack of diagnosis, severe illness requiring hospitalization, as well as anesthesia and sedation related illnesses.  During attempts at advanced resection, the risks of bleeding and perforation/leak are increased as opposed to diagnostic and screening procedures, and that was discussed with the patient as well.   In addition, I explained that with the possible need for piecemeal resection, subsequent short-interval endoscopic evaluation for  follow up and potential retreatment of the lesion/area may be necessary.  I did offer, a referral to surgery in order for patient to have opportunity to discuss surgical management/intervention prior to finalizing decision for attempt at endoscopic removal, however, the patient deferred on this.  If, after attempt at removal of the polyp/lesion, it is found that the patient has a complication or that an invasive lesion or malignant lesion is found, or that the polyp/lesion continues to recur, the patient is aware and understands that surgery may still be indicated/required.  We discussed a referral to Dr. Barry Dienes so that she will have met the patient and know about her should we not be able to resect this endoscopically.  We will move forward with that per her request.    Although the pictures had suggested that this was distal to the ampulla, side-viewing endoscope will be necessary to help me better discern this completely.  The risks of EUS including bleeding, infection, aspiration pneumonia and intestinal perforation were discussed as was the possibility it may not give a definitive diagnosis.  If a biopsy of the pancreas is done as part of the EUS, there is an additional risk of pancreatitis at the rate of about 1%.  It was explained that procedure related pancreatitis is typically mild, although can be severe and even life threatening, which is why we do not perform random pancreatic biopsies and only biopsy a lesion we feel is concerning enough to warrant the risk.  All patient questions were answered, to the best of my ability, and the patient agrees to the aforementioned plan of action with follow-up as indicated.    PLAN  Preprocedure labs of a CBC/BMP will be obtained at patient's hemodialysis center at least 1 to 2 weeks before Move forward with scheduled EGD/EUS with EMR attempt We will try to touch base with patient's nephrology team in effort of seeing if her hemodialysis sessions can be  adjusted for her procedure that needs to be done on a Monday -Query the week before doing Tuesday/Thursday/Saturday versus Monday/Wednesday/Friday/partial  Saturday -Though final decision up to nephrology - Ambulatory referral to General Surgery-Dr. Barry Dienes for consideration of surgical resection should endoscopic resection not be possible Coumadin will need to be off for 5 days prior to procedure as per Dr. Vena Rua EGD/Colonoscopy   Orders Placed This Encounter  Procedures  . Ambulatory referral to General Surgery    New Prescriptions   No medications on file   Modified Medications   No medications on file    Planned Follow Up No follow-ups on file.   Total Time in Face-to-Face and in Coordination of Care for patient including independent/personal interpretation/review of prior testing, medical history, examination, medication adjustment, communicating results with the patient directly, and documentation with the EHR is 30 minutes.   Justice Britain, MD [facility] Advanced Endoscopy Office # [phone]

## 2019-12-02 ENCOUNTER — Telehealth: Payer: Self-pay | Admitting: Gastroenterology

## 2019-12-02 DIAGNOSIS — Z992 Dependence on renal dialysis: Secondary | ICD-10-CM | POA: Diagnosis not present

## 2019-12-02 DIAGNOSIS — D509 Iron deficiency anemia, unspecified: Secondary | ICD-10-CM | POA: Diagnosis not present

## 2019-12-02 DIAGNOSIS — N2581 Secondary hyperparathyroidism of renal origin: Secondary | ICD-10-CM | POA: Diagnosis not present

## 2019-12-02 DIAGNOSIS — D631 Anemia in chronic kidney disease: Secondary | ICD-10-CM | POA: Diagnosis not present

## 2019-12-02 DIAGNOSIS — N186 End stage renal disease: Secondary | ICD-10-CM | POA: Diagnosis not present

## 2019-12-02 NOTE — Telephone Encounter (Signed)
Thanks Patty for sending this information to me.  Dr. Carolin Sicks, I wanted to see if the patient could have her Hemodialysis schedule the week before and the week of the procedure slightly adjusted. She is currently scheduled for a procedure on June 21 (Monday). She normally gets HD on M/W/F. I wondered, if the week before procedure if she could get M/W/F/Partial Saturday dialysis or transition to T/Th/Sat Dialysis. She won't be able to make it to her Monday HD session on the day of her procedure. I want to ensure that she is as good as possible before her procedure from an Anesthesia/volume perspective. I hope this makes sense but happy to discuss further, and obviously I leave it up to your final decision/discretion since this is your scope of practice. Thanks again.  GM

## 2019-12-02 NOTE — Telephone Encounter (Signed)
Pt called to let Dr. Rush Landmark know the name of her Nephrologist, Dr. Lawson Radar and his office number is [phone]. She stated that Dr. Rush Landmark needed this information.

## 2019-12-02 NOTE — Telephone Encounter (Signed)
FYI for your information per pt

## 2019-12-03 ENCOUNTER — Encounter: Payer: Self-pay | Admitting: Gastroenterology

## 2019-12-03 DIAGNOSIS — D132 Benign neoplasm of duodenum: Secondary | ICD-10-CM | POA: Insufficient documentation

## 2019-12-03 DIAGNOSIS — D1330 Benign neoplasm of unspecified part of small intestine: Secondary | ICD-10-CM | POA: Insufficient documentation

## 2019-12-03 DIAGNOSIS — R933 Abnormal findings on diagnostic imaging of other parts of digestive tract: Secondary | ICD-10-CM | POA: Insufficient documentation

## 2019-12-04 DIAGNOSIS — K317 Polyp of stomach and duodenum: Secondary | ICD-10-CM | POA: Insufficient documentation

## 2019-12-04 DIAGNOSIS — K31A0 Gastric intestinal metaplasia, unspecified: Secondary | ICD-10-CM | POA: Insufficient documentation

## 2019-12-05 ENCOUNTER — Telehealth: Payer: Self-pay

## 2019-12-05 DIAGNOSIS — N2581 Secondary hyperparathyroidism of renal origin: Secondary | ICD-10-CM | POA: Diagnosis not present

## 2019-12-05 DIAGNOSIS — N186 End stage renal disease: Secondary | ICD-10-CM | POA: Diagnosis not present

## 2019-12-05 DIAGNOSIS — Z992 Dependence on renal dialysis: Secondary | ICD-10-CM | POA: Diagnosis not present

## 2019-12-05 DIAGNOSIS — D631 Anemia in chronic kidney disease: Secondary | ICD-10-CM | POA: Diagnosis not present

## 2019-12-05 DIAGNOSIS — D509 Iron deficiency anemia, unspecified: Secondary | ICD-10-CM | POA: Diagnosis not present

## 2019-12-05 NOTE — Telephone Encounter (Signed)
Request for surgical clearance:     Endoscopy Procedure  What type of surgery is being performed?    EUS +EGD w/ EMR Attempt  When is this surgery scheduled?   01/09/20  What type of clearance is required ?   Pharmacy  Are there any medications that need to be held prior to surgery and how long? Coumadin x5 days prior to procedure  Practice name and name of physician performing surgery?      [facility]-Dr Mansouraty  What is your office phone and fax number?      Phone- [phone]  Fax- [phone] Attn: Helmut Muster   Anesthesia type (None, local, MAC, general) ?       MAC

## 2019-12-06 NOTE — Telephone Encounter (Signed)
This is not a [facility] patient.  Belongs to [facility] - Dr. Einar Gip

## 2019-12-06 NOTE — Telephone Encounter (Signed)
Thank you. Will send request to Dr. Einar Gip.

## 2019-12-06 NOTE — Telephone Encounter (Signed)
Request for surgical clearance:     Endoscopy Procedure Dr.Ganji please see clearance request below.  What type of surgery is being performed?     EUS+EGD w/ EMR attempt.  When is this surgery scheduled?     01/09/20    What type of clearance is required ?   Pharmacy  Are there any medications that need to be held prior to surgery and how long? Coumadin x5 days   Practice name and name of physician performing surgery?      [facility]-Dr Mansouraty  What is your office phone and fax number?      Phone- [phone]  Fax- [phone]: [facility]   Anesthesia type (None, local, MAC, general) ?       MAC

## 2019-12-06 NOTE — Telephone Encounter (Signed)
Hold coumadin for 5 days and restart same day of procedure and patient to come in for INR check 5 days later. Okay to proceed with low risk.  JG

## 2019-12-07 DIAGNOSIS — N186 End stage renal disease: Secondary | ICD-10-CM | POA: Diagnosis not present

## 2019-12-07 DIAGNOSIS — D631 Anemia in chronic kidney disease: Secondary | ICD-10-CM | POA: Diagnosis not present

## 2019-12-07 DIAGNOSIS — Z992 Dependence on renal dialysis: Secondary | ICD-10-CM | POA: Diagnosis not present

## 2019-12-07 DIAGNOSIS — N2581 Secondary hyperparathyroidism of renal origin: Secondary | ICD-10-CM | POA: Diagnosis not present

## 2019-12-07 DIAGNOSIS — D509 Iron deficiency anemia, unspecified: Secondary | ICD-10-CM | POA: Diagnosis not present

## 2019-12-08 ENCOUNTER — Ambulatory Visit: Payer: Medicare Other | Admitting: Pharmacist

## 2019-12-08 ENCOUNTER — Other Ambulatory Visit: Payer: Self-pay

## 2019-12-08 DIAGNOSIS — I480 Paroxysmal atrial fibrillation: Secondary | ICD-10-CM | POA: Diagnosis not present

## 2019-12-08 DIAGNOSIS — Z5181 Encounter for therapeutic drug level monitoring: Secondary | ICD-10-CM | POA: Diagnosis not present

## 2019-12-08 DIAGNOSIS — Z7901 Long term (current) use of anticoagulants: Secondary | ICD-10-CM | POA: Diagnosis not present

## 2019-12-08 LAB — POCT INR: INR: 2.1 (ref 2.0–3.0)

## 2019-12-08 NOTE — Patient Instructions (Signed)
INR at goal. Continue taking 7.5 mg every Mon, Wed, Fri and 5 mg all other days. Recheck in 5 days.

## 2019-12-08 NOTE — Progress Notes (Signed)
Anticoagulation Management Jane [name] is a 74 y.o. female who reports to the clinic for monitoring of warfarin treatment.    Indication: atrial fibrillation CHA2DS2 Vasc Score 4 (Age >73, female, HTN hx), HAS-BLED 2 (Age>65, renal disease)  Duration: indefinite Supervising physician: Adrian Prows  Anticoagulation Clinic Visit History:  Patient does not report signs/symptoms of bleeding or thromboembolism.  Other recent changes: No change in diet, medications, lifestyle. Pt still having difficulty swallowing and being able to maintain her normal appetite and nutritional intake. Pt planning on starting nutritional supplements to adjunct her current poor nutrition and appetite status. Considering to buy BOOST over this weekend. Pt aware of increased VitK content of nutritional supplements and the need for close INR monitoring after starting supplemental nutritional drinks. Pt aware about the importance of remaining consistent with intake/week.   Pt had OV w/ GI on 5/13. Pt is being scheduled for EUS and EGD w/ EMR attempt on 01/09/20 for management of Tubulovillous adenoma of small intestines and Adenomatous duodenal polyps with possible resection and poypectomy. Possible surgical follow up based on the severity and complexity of the biopsy and polyp removal. Pt instructed to hold coumadin 5 days prior to the procedure. Approved by Dr. Einar Gip. Discussed with pt. Pt aware to inquire the surgeon to confirm restart date.   GI is working with nephrology to transition pt to T/Th/Sat dialysis session pre and post GI procedure. Currently on Mon/Wed/Fri   Anticoagulation Episode Summary    Current INR goal:  2.0-3.0  TTR:  76.9 % (1.2 y)  Next INR check:  12/13/2019  INR from last check:  2.1 (12/08/2019)  Weekly max warfarin dose:    Target end date:  Indefinite  INR check location:    Preferred lab:    Send INR reminders to:     Indications   Paroxysmal atrial fibrillation (HCC) [I48.0]       Comments:          Allergies  Allergen Reactions  . Latex Rash  . Other Other (See Comments)    The plastic tape irritates her skin.  Silicone tapes cause redness  . Penicillins Rash  . Sulfa Antibiotics Rash  . Tape Other (See Comments)    PLASTIC TAPE TEARS OFF THE SKIN AND BRUISES IT TERRIBLY!! GETS ITCHY AND RASH FROM PAPER AND PLASTIC TAPES PLASTIC TAPE TEARS OFF THE SKIN AND BRUISES IT TERRIBLY!! PLASTIC TAPE TEARS OFF THE SKIN AND BRUISES IT TERRIBLY!!    Current Outpatient Medications:  .  albuterol (PROVENTIL HFA;VENTOLIN HFA) 108 (90 Base) MCG/ACT inhaler, Inhale 1 puff into the lungs every 6 (six) hours as needed for wheezing or shortness of breath., Disp: 6.7 g, Rfl: 2 .  carvedilol (COREG) 12.5 MG tablet, Take 1 tablet (12.5 mg total) by mouth as needed (if BP is above 170)., Disp: 60 tablet, Rfl: 3 .  cinacalcet (SENSIPAR) 60 MG tablet, Take 60 mg by mouth every other day. Taking Mon, Wed, Fri, Disp: , Rfl:  .  CONSTULOSE 10 GM/15ML solution, Taking 1 tablespoon TID PRN for constipation, Disp: , Rfl:  .  ethyl chloride spray, USE 1 TO 2 SPRAYS TO DIALYSIS ACCESS EVERY TREATMENT, Disp: , Rfl: 12 .  fludrocortisone (FLORINEF) 0.1 MG tablet, TAKE 1 TABLET BY MOUTH EVERY OTHER DAY, Disp: 30 tablet, Rfl: 0 .  guaiFENesin-codeine 100-10 MG/5ML syrup, Take 10 mLs by mouth every 6 (six) hours as needed for cough., Disp: 118 mL, Rfl: 0 .  Methoxy PEG-Epoetin Beta (MIRCERA IJ), IV  with dialysis, Disp: , Rfl:  .  midodrine (PROAMATINE) 10 MG tablet, Take 10 mg by mouth 3 (three) times daily. Taking PRN; atleast once prior dialysis session, Disp: , Rfl:  .  multivitamin (RENA-VIT) TABS tablet, Take 1 tablet by mouth daily., Disp: , Rfl:  .  nystatin cream (MYCOSTATIN), Apply 1 application topically 2 (two) times daily., Disp: 45 g, Rfl: 0 .  omeprazole (PRILOSEC) 20 MG capsule, Take 1 capsule (20 mg total) by mouth in the morning and at bedtime., Disp: 60 capsule, Rfl: 2 .   sevelamer carbonate (RENVELA) 800 MG tablet, Take 800-1,600 mg by mouth See admin instructions. 2 tabs 3 times daily, Disp: , Rfl:  .  terconazole (TERAZOL 7) 0.4 % vaginal cream, Place 1 applicator vaginally at bedtime., Disp: 45 g, Rfl: 1 .  triamcinolone cream (KENALOG) 0.1 %, Apply 1 application topically 2 (two) times daily., Disp: 45 g, Rfl: 0 .  warfarin (COUMADIN) 5 MG tablet, TAKE 1 & 1/2 (ONE & ONE-HALF) TABLETS BY MOUTH EVERY TUESDAY AND EVERY THURSDAY AND 1 TABLET ONCE DAILY ALL OTHER DAYS, Disp: 113 tablet, Rfl: 0 Past Medical History:  Diagnosis Date  . Acid reflux   . Anemia of chronic disease   . Atrial fibrillation ([place])   . Bilateral carotid bruits   . Diverticulitis   . ESRD (end stage renal disease) ([place])   . Hypertension   . Malaise and fatigue   . Orthostatic hypotension   . Shortness of breath   . Sleep apnea   . Syncope   . Tubulovillous adenoma of colon    ASSESSMENT  Recent Results: The most recent result is correlated with 42.5 mg per week:  Lab Results  Component Value Date   INR 2.1 12/08/2019   INR 1.6 (A) 11/29/2019   INR 1.4 (A) 11/22/2019    Anticoagulation Dosing: Description   INR at goal. Continue taking 7.5 mg every Mon, Wed, Fri and 5 mg all other days. Recheck in 5 days.      INR today: Therapeutic following dose increase last week. INR likely to change following pt decision to start nutritional supplements. Pt isn't fully committed to starting and remaining consistent on it and would be making the decision over the weekend. Would continue close monitoring and check in to ensure INR remains within range and review pt's decision next week. Will review warfarin hold and restart closer to the scheduled procedure.  PLAN Weekly dose was unchanged. Continue taking 7.5 mg every Mon, Wed, Fri and 5 mg all other days. Recheck INR in 5 days.    Patient Instructions  INR at goal. Continue taking 7.5 mg every Mon, Wed, Fri and 5 mg all other  days. Recheck in 5 days.  Patient advised to contact clinic or seek medical attention if signs/symptoms of bleeding or thromboembolism occur.  Patient verbalized understanding by repeating back information and was advised to contact me if further medication-related questions arise.   Follow-up Return in about 5 days (around 12/13/2019).  [name], PharmD  15 minutes spent face-to-face with the patient during the encounter. 50% of time spent on education, including signs/sx bleeding and clotting, as well as food and drug interactions with warfarin. 50% of time was spent on fingerprick POC INR sample collection,processing, results determination, and documentation

## 2019-12-09 DIAGNOSIS — Z992 Dependence on renal dialysis: Secondary | ICD-10-CM | POA: Diagnosis not present

## 2019-12-09 DIAGNOSIS — D631 Anemia in chronic kidney disease: Secondary | ICD-10-CM | POA: Diagnosis not present

## 2019-12-09 DIAGNOSIS — D509 Iron deficiency anemia, unspecified: Secondary | ICD-10-CM | POA: Diagnosis not present

## 2019-12-09 DIAGNOSIS — N186 End stage renal disease: Secondary | ICD-10-CM | POA: Diagnosis not present

## 2019-12-09 DIAGNOSIS — N2581 Secondary hyperparathyroidism of renal origin: Secondary | ICD-10-CM | POA: Diagnosis not present

## 2019-12-12 DIAGNOSIS — N2581 Secondary hyperparathyroidism of renal origin: Secondary | ICD-10-CM | POA: Diagnosis not present

## 2019-12-12 DIAGNOSIS — N186 End stage renal disease: Secondary | ICD-10-CM | POA: Diagnosis not present

## 2019-12-12 DIAGNOSIS — D509 Iron deficiency anemia, unspecified: Secondary | ICD-10-CM | POA: Diagnosis not present

## 2019-12-12 DIAGNOSIS — Z992 Dependence on renal dialysis: Secondary | ICD-10-CM | POA: Diagnosis not present

## 2019-12-12 DIAGNOSIS — D631 Anemia in chronic kidney disease: Secondary | ICD-10-CM | POA: Diagnosis not present

## 2019-12-12 NOTE — Telephone Encounter (Signed)
Pt informed- Ok to hold Coumadin 5 days before procedure. Pt voiced understanding.

## 2019-12-13 ENCOUNTER — Other Ambulatory Visit: Payer: Self-pay

## 2019-12-13 ENCOUNTER — Ambulatory Visit: Payer: Medicare Other | Admitting: Pharmacist

## 2019-12-13 DIAGNOSIS — I480 Paroxysmal atrial fibrillation: Secondary | ICD-10-CM

## 2019-12-13 DIAGNOSIS — Z5181 Encounter for therapeutic drug level monitoring: Secondary | ICD-10-CM

## 2019-12-13 DIAGNOSIS — Z7901 Long term (current) use of anticoagulants: Secondary | ICD-10-CM | POA: Diagnosis not present

## 2019-12-13 LAB — POCT INR: INR: 2.4 (ref 2.0–3.0)

## 2019-12-13 NOTE — Patient Instructions (Addendum)
INR at goal. Continue taking 7.5 mg every Mon, Wed, Fri and 5 mg all other days. Recheck in 3 weeks.Marland Kitchen

## 2019-12-13 NOTE — Progress Notes (Signed)
Anticoagulation Management Jane [name] is a 74 y.o. female who reports to the clinic for monitoring of warfarin treatment.    Indication: atrial fibrillation CHA2DS2 Vasc Score 4 (Age >91, female, HTN hx), HAS-BLED 2 (Age>65, renal disease)  Duration: indefinite Supervising physician: Adrian Prows  Anticoagulation Clinic Visit History:  Patient does not report signs/symptoms of bleeding or thromboembolism.  Other recent changes: No change in diet, medications, lifestyle. Pt states that her appetite has improved slightly. Decided against starting nutritional supplements due to concern of potential interaction with warfarin and the need for continued close monitoring. Pt will try to return to her baseline diet. Complains of constipation recently and will be following up with PCP for further management if it continues to be bothersome.   Pt is being scheduled for EUS and EGD w/ EMR attempt on 01/09/20 for management of Tubulovillous adenoma of smalld intestines and Adenomatous duodenal polyps with possible resection and poypectomy. Possible surgical follow up basedon the severity and complexity of the biopsy and polyp removal. Pt instructed to hold coumadin 5 days prior to the procedure (warfarin stop date ~01/03/20). Approved by Dr. Einar Gip. Discussed with pt. Pt aware to inquire the surgeon to confirm restart date.   GI is working with nephrology to transition pt to T/Th/Sat dialysis session pre and post GI procedure. Currently on Mon/Wed/Fri   Anticoagulation Episode Summary    Current INR goal:  2.0-3.0  TTR:  77.2 % (1.2 y)  Next INR check:  01/03/2020  INR from last check:  2.4 (12/13/2019)  Weekly max warfarin dose:    Target end date:  Indefinite  INR check location:    Preferred lab:    Send INR reminders to:     Indications   Paroxysmal atrial fibrillation (HCC) [I48.0]       Comments:          Allergies  Allergen Reactions  . Latex Rash  . Other Other (See Comments)    The  plastic tape irritates her skin.  Silicone tapes cause redness  . Penicillins Rash  . Sulfa Antibiotics Rash  . Tape Other (See Comments)    PLASTIC TAPE TEARS OFF THE SKIN AND BRUISES IT TERRIBLY!! GETS ITCHY AND RASH FROM PAPER AND PLASTIC TAPES PLASTIC TAPE TEARS OFF THE SKIN AND BRUISES IT TERRIBLY!! PLASTIC TAPE TEARS OFF THE SKIN AND BRUISES IT TERRIBLY!!    Current Outpatient Medications:  .  albuterol (PROVENTIL HFA;VENTOLIN HFA) 108 (90 Base) MCG/ACT inhaler, Inhale 1 puff into the lungs every 6 (six) hours as needed for wheezing or shortness of breath., Disp: 6.7 g, Rfl: 2 .  carvedilol (COREG) 12.5 MG tablet, Take 1 tablet (12.5 mg total) by mouth as needed (if BP is above 170)., Disp: 60 tablet, Rfl: 3 .  cinacalcet (SENSIPAR) 60 MG tablet, Take 60 mg by mouth every other day. Taking Mon, Wed, Fri, Disp: , Rfl:  .  CONSTULOSE 10 GM/15ML solution, Taking 1 tablespoon TID PRN for constipation, Disp: , Rfl:  .  ethyl chloride spray, USE 1 TO 2 SPRAYS TO DIALYSIS ACCESS EVERY TREATMENT, Disp: , Rfl: 12 .  fludrocortisone (FLORINEF) 0.1 MG tablet, TAKE 1 TABLET BY MOUTH EVERY OTHER DAY, Disp: 30 tablet, Rfl: 0 .  guaiFENesin-codeine 100-10 MG/5ML syrup, Take 10 mLs by mouth every 6 (six) hours as needed for cough., Disp: 118 mL, Rfl: 0 .  Methoxy PEG-Epoetin Beta (MIRCERA IJ), IV with dialysis, Disp: , Rfl:  .  midodrine (PROAMATINE) 10 MG tablet, Take 10  mg by mouth 3 (three) times daily. Taking PRN; atleast once prior dialysis session, Disp: , Rfl:  .  multivitamin (RENA-VIT) TABS tablet, Take 1 tablet by mouth daily., Disp: , Rfl:  .  nystatin cream (MYCOSTATIN), Apply 1 application topically 2 (two) times daily., Disp: 45 g, Rfl: 0 .  omeprazole (PRILOSEC) 20 MG capsule, Take 1 capsule (20 mg total) by mouth in the morning and at bedtime., Disp: 60 capsule, Rfl: 2 .  sevelamer carbonate (RENVELA) 800 MG tablet, Take 800-1,600 mg by mouth See admin instructions. 2 tabs 3 times daily,  Disp: , Rfl:  .  terconazole (TERAZOL 7) 0.4 % vaginal cream, Place 1 applicator vaginally at bedtime., Disp: 45 g, Rfl: 1 .  triamcinolone cream (KENALOG) 0.1 %, Apply 1 application topically 2 (two) times daily., Disp: 45 g, Rfl: 0 .  warfarin (COUMADIN) 5 MG tablet, TAKE 1 & 1/2 (ONE & ONE-HALF) TABLETS BY MOUTH EVERY TUESDAY AND EVERY THURSDAY AND 1 TABLET ONCE DAILY ALL OTHER DAYS, Disp: 113 tablet, Rfl: 0 Past Medical History:  Diagnosis Date  . Acid reflux   . Anemia of chronic disease   . Atrial fibrillation ([place])   . Bilateral carotid bruits   . Diverticulitis   . ESRD (end stage renal disease) ([place])   . Hypertension   . Malaise and fatigue   . Orthostatic hypotension   . Shortness of breath   . Sleep apnea   . Syncope   . Tubulovillous adenoma of colon    ASSESSMENT  Recent Results: The most recent result is correlated with 42.5 mg per week:  Lab Results  Component Value Date   INR 2.4 12/13/2019   INR 2.1 12/08/2019   INR 1.6 (A) 11/29/2019    Anticoagulation Dosing: Description   INR at goal. Continue taking 7.5 mg every Mon, Wed, Fri and 5 mg all other days. Recheck in 3 weeks.      INR today: Therapeutic. Pt continues to remain therapeutic on the current dose. Pt denies any changes in diet, lifestyle, medications. Pt is scheduled to stop warfarin on 6/16 for 5 days prior to scheduled EGD and EUS procedure on 6/21. Will recheck INR prior to scheduled stop date and reinforce dosing instructions prior to and post procedure.   PLAN Weekly dose was unchanged. Continue taking 7.5 mg every Mon, Wed, Fri and 5 mg all other days. Recheck INR in 3 weeks.     Patient Instructions  INR at goal. Continue taking 7.5 mg every Mon, Wed, Fri and 5 mg all other days. Recheck in 5 days.  Patient advised to contact clinic or seek medical attention if signs/symptoms of bleeding or thromboembolism occur.  Patient verbalized understanding by repeating back information and was  advised to contact me if further medication-related questions arise.   Follow-up Return in about 3 weeks (around 01/03/2020).  [name], PharmD  15 minutes spent face-to-face with the patient during the encounter. 50% of time spent on education, including signs/sx bleeding and clotting, as well as food and drug interactions with warfarin. 50% of time was spent on fingerprick POC INR sample collection,processing, results determination, and documentation

## 2019-12-14 DIAGNOSIS — Z992 Dependence on renal dialysis: Secondary | ICD-10-CM | POA: Diagnosis not present

## 2019-12-14 DIAGNOSIS — N2581 Secondary hyperparathyroidism of renal origin: Secondary | ICD-10-CM | POA: Diagnosis not present

## 2019-12-14 DIAGNOSIS — D631 Anemia in chronic kidney disease: Secondary | ICD-10-CM | POA: Diagnosis not present

## 2019-12-14 DIAGNOSIS — D509 Iron deficiency anemia, unspecified: Secondary | ICD-10-CM | POA: Diagnosis not present

## 2019-12-14 DIAGNOSIS — N186 End stage renal disease: Secondary | ICD-10-CM | POA: Diagnosis not present

## 2019-12-15 DIAGNOSIS — H2513 Age-related nuclear cataract, bilateral: Secondary | ICD-10-CM | POA: Diagnosis not present

## 2019-12-15 DIAGNOSIS — H25013 Cortical age-related cataract, bilateral: Secondary | ICD-10-CM | POA: Diagnosis not present

## 2019-12-15 DIAGNOSIS — H25043 Posterior subcapsular polar age-related cataract, bilateral: Secondary | ICD-10-CM | POA: Diagnosis not present

## 2019-12-15 DIAGNOSIS — H2511 Age-related nuclear cataract, right eye: Secondary | ICD-10-CM | POA: Diagnosis not present

## 2019-12-15 DIAGNOSIS — H5500 Unspecified nystagmus: Secondary | ICD-10-CM | POA: Diagnosis not present

## 2019-12-16 DIAGNOSIS — D631 Anemia in chronic kidney disease: Secondary | ICD-10-CM | POA: Diagnosis not present

## 2019-12-16 DIAGNOSIS — D509 Iron deficiency anemia, unspecified: Secondary | ICD-10-CM | POA: Diagnosis not present

## 2019-12-16 DIAGNOSIS — N186 End stage renal disease: Secondary | ICD-10-CM | POA: Diagnosis not present

## 2019-12-16 DIAGNOSIS — N2581 Secondary hyperparathyroidism of renal origin: Secondary | ICD-10-CM | POA: Diagnosis not present

## 2019-12-16 DIAGNOSIS — Z992 Dependence on renal dialysis: Secondary | ICD-10-CM | POA: Diagnosis not present

## 2019-12-19 DIAGNOSIS — D631 Anemia in chronic kidney disease: Secondary | ICD-10-CM | POA: Diagnosis not present

## 2019-12-19 DIAGNOSIS — D509 Iron deficiency anemia, unspecified: Secondary | ICD-10-CM | POA: Diagnosis not present

## 2019-12-19 DIAGNOSIS — Z992 Dependence on renal dialysis: Secondary | ICD-10-CM | POA: Diagnosis not present

## 2019-12-19 DIAGNOSIS — N186 End stage renal disease: Secondary | ICD-10-CM | POA: Diagnosis not present

## 2019-12-19 DIAGNOSIS — N2581 Secondary hyperparathyroidism of renal origin: Secondary | ICD-10-CM | POA: Diagnosis not present

## 2019-12-22 ENCOUNTER — Other Ambulatory Visit: Payer: Self-pay | Admitting: Pharmacist

## 2019-12-22 DIAGNOSIS — I10 Essential (primary) hypertension: Secondary | ICD-10-CM

## 2019-12-22 DIAGNOSIS — I129 Hypertensive chronic kidney disease with stage 1 through stage 4 chronic kidney disease, or unspecified chronic kidney disease: Secondary | ICD-10-CM

## 2019-12-22 MED ORDER — HYDRALAZINE HCL 25 MG PO TABS: 25.0000 mg | ORAL_TABLET | Freq: Three times a day (TID) | ORAL | 2 refills | Status: DC

## 2019-12-22 MED ORDER — ISOSORBIDE DINITRATE 10 MG PO TABS: 30.0000 mg | ORAL_TABLET | Freq: Three times a day (TID) | ORAL | 2 refills | Status: DC

## 2019-12-22 NOTE — Progress Notes (Signed)
Home BP readings trending up. Morning readings remain elevated. Ranging from 160s-180s over the past week. Pt stated that BP elevated especially before her dialysis session, but tended to improve after her session. Currently taking carvedilol 25 mg BID for BP management. Over the past week, her BP hadnt improved much after her dialysis session. Reviewed pt w/ Dr. Einar Gip. Per Dr. Einar Gip, will have pt start or isordil and hydralazine dose TID and have pt continue monitoring her BP readings closely. Prior history of hypotensive BP readings and orthostasis. Reviewed changes with pt. Answered pt's questions. Pt verbalized understanding.

## 2019-12-28 DIAGNOSIS — I4891 Unspecified atrial fibrillation: Secondary | ICD-10-CM | POA: Diagnosis not present

## 2020-01-03 ENCOUNTER — Ambulatory Visit: Payer: Medicare Other | Admitting: Pharmacist

## 2020-01-03 ENCOUNTER — Other Ambulatory Visit: Payer: Self-pay

## 2020-01-03 DIAGNOSIS — I480 Paroxysmal atrial fibrillation: Secondary | ICD-10-CM

## 2020-01-03 DIAGNOSIS — Z7901 Long term (current) use of anticoagulants: Secondary | ICD-10-CM

## 2020-01-03 DIAGNOSIS — Z5181 Encounter for therapeutic drug level monitoring: Secondary | ICD-10-CM | POA: Insufficient documentation

## 2020-01-03 LAB — POCT INR: INR: 2.6 (ref 2.0–3.0)

## 2020-01-03 NOTE — Patient Instructions (Signed)
INR at goal. HOLD for 5 days prior to procedure. Restart previous maintenance dose if approved by the surgeon the day off the procedure. Continue taking 7.5 mg every Mon, Wed, Fri and 5 mg all other days. Recheck in 2 weeks.

## 2020-01-03 NOTE — Progress Notes (Signed)
Anticoagulation Management Jane [name] is a 74 y.o. female who reports to the clinic for monitoring of warfarin treatment.    Indication: atrial fibrillation CHA2DS2 Vasc Score 4 (Age >78, female, HTN hx), HAS-BLED 2 (Age>65, renal disease)  Duration: indefinite Supervising physician: Adrian Prows  Anticoagulation Clinic Visit History:  Patient does not report signs/symptoms of bleeding or thromboembolism.  Other recent changes: No change in diet, medications, lifestyle.   Pt is being scheduled for EUS and EGD w/ EMR attempt on 01/09/20 for management of Tubulovillous adenoma of smalld intestines and Adenomatous duodenal polyps with possible resection and poypectomy. Possible surgical follow up based on the severity and complexity of the biopsy and polyp removal. Pt instructed to hold coumadin 5 days prior to the procedure (warfarin stop date 01/04/20). Approved by Dr. Einar Gip. Discussed with pt. Pt aware to inquire the surgeon to confirm warfarin restart date.   Pt also has a cataract surgery scheduled for 6/23.  Anticoagulation Episode Summary    Current INR goal:  2.0-3.0  TTR:  78.2 % (1.3 y)  Next INR check:  01/17/2020  INR from last check:  2.6 (01/03/2020)  Weekly max warfarin dose:    Target end date:  Indefinite  INR check location:    Preferred lab:    Send INR reminders to:     Indications   Paroxysmal atrial fibrillation (HCC) [I48.0] Monitoring for long-term anticoagulant use [Z51.81 Z79.01]       Comments:          Allergies  Allergen Reactions  . Latex Rash  . Penicillins     Yeast infection   . Sulfa Antibiotics Rash  . Tape Other (See Comments)    Plastic, silicone, and paper tape causes bruising and pulls off skin. Cloth tape works fine    Current Outpatient Medications:  .  carvedilol (COREG) 25 MG tablet, Take 25 mg by mouth 2 (two) times daily with a meal., Disp: , Rfl:  .  hydrALAZINE (APRESOLINE) 25 MG tablet, Take 1 tablet (25 mg total) by mouth 3  (three) times daily., Disp: 90 tablet, Rfl: 2 .  isosorbide dinitrate (ISORDIL) 10 MG tablet, Take 3 tablets (30 mg total) by mouth 3 (three) times daily. (Patient taking differently: Take 20 mg by mouth 3 (three) times daily. ), Disp: 90 tablet, Rfl: 2 .  acetaminophen (TYLENOL) 500 MG tablet, Take 1,500 mg by mouth daily as needed for headache., Disp: , Rfl:  .  albuterol (PROVENTIL HFA;VENTOLIN HFA) 108 (90 Base) MCG/ACT inhaler, Inhale 1 puff into the lungs every 6 (six) hours as needed for wheezing or shortness of breath. (Patient taking differently: Inhale 2 puffs into the lungs every 6 (six) hours as needed for wheezing or shortness of breath. ), Disp: 6.7 g, Rfl: 2 .  cinacalcet (SENSIPAR) 60 MG tablet, Take 60 mg by mouth every Monday, Wednesday, and Friday. , Disp: , Rfl:  .  ethyl chloride spray, Apply 1 application topically daily as needed (port access). , Disp: , Rfl: 12 .  fludrocortisone (FLORINEF) 0.1 MG tablet, TAKE 1 TABLET BY MOUTH EVERY OTHER DAY (Patient not taking: Reported on 12/29/2019), Disp: 30 tablet, Rfl: 0 .  guaiFENesin-codeine 100-10 MG/5ML syrup, Take 10 mLs by mouth every 6 (six) hours as needed for cough. (Patient not taking: Reported on 12/29/2019), Disp: 118 mL, Rfl: 0 .  Methoxy PEG-Epoetin Beta (MIRCERA IJ), IV with dialysis, Disp: , Rfl:  .  multivitamin (RENA-VIT) TABS tablet, Take 1 tablet by mouth at  bedtime. , Disp: , Rfl:  .  nystatin cream (MYCOSTATIN), Apply 1 application topically 2 (two) times daily. (Patient taking differently: Apply 1 application topically 2 (two) times daily as needed (rash). ), Disp: 45 g, Rfl: 0 .  omeprazole (PRILOSEC) 20 MG capsule, Take 1 capsule (20 mg total) by mouth in the morning and at bedtime., Disp: 60 capsule, Rfl: 2 .  polyethylene glycol (MIRALAX / GLYCOLAX) 17 g packet, Take 17 g by mouth in the morning, at noon, and at bedtime., Disp: , Rfl:  .  senna (SENOKOT) 8.6 MG tablet, Take 3 tablets by mouth 2 (two) times daily.,  Disp: , Rfl:  .  sevelamer carbonate (RENVELA) 800 MG tablet, Take 1,600-2,400 mg by mouth See admin instructions. Take Take 1600 to 2400 mg with each meal, Disp: , Rfl:  .  triamcinolone cream (KENALOG) 0.1 %, Apply 1 application topically 2 (two) times daily., Disp: 45 g, Rfl: 0 .  warfarin (COUMADIN) 5 MG tablet, TAKE 1 & 1/2 (ONE & ONE-HALF) TABLETS BY MOUTH EVERY TUESDAY AND EVERY THURSDAY AND 1 TABLET ONCE DAILY ALL OTHER DAYS (Patient taking differently: Take 5-7.5 mg by mouth See admin instructions. Take 7.5 mg at night on Mon, Wed, and Fri. Take 5 mg at night on Sun, Tue, Thurs, and Sat), Disp: 113 tablet, Rfl: 0 Past Medical History:  Diagnosis Date  . Acid reflux   . Anemia of chronic disease   . Atrial fibrillation ([place])   . Bilateral carotid bruits   . Diverticulitis   . ESRD (end stage renal disease) ([place])   . Hypertension   . Malaise and fatigue   . Orthostatic hypotension   . Shortness of breath   . Sleep apnea   . Syncope   . Tubulovillous adenoma of colon    ASSESSMENT  Recent Results: The most recent result is correlated with 42.5 mg per week:  Lab Results  Component Value Date   INR 2.6 01/03/2020   INR 2.4 12/13/2019   INR 2.1 12/08/2019    Anticoagulation Dosing: Description   INR at goal. HOLD for 5 days prior to procedure. Restart previous maintenance dose if approved by the surgeon the day off the procedure. Continue taking 7.5 mg every Mon, Wed, Fri and 5 mg all other days. Recheck in 2 weeks.      INR today: Therapeutic. Pt continues to remain therapeutic on the current dose. Pt denies any changes in diet, lifestyle, medications. Pt is scheduled to stop warfarin on 6/16 for 5 days prior to scheduled EGD and EUS procedure on 6/21. Reviewed hold and restart direction with pt. Pt to confirm with surgeon when to restart previous maintenance dose. Will follow up with pt 1 week after the scheduled procedure.   PLAN Weekly dose was unchanged. HOLD warfarin  from 6/16 - 6/20. If okay with the provider, restart previous maintenance dose the day of the procedure. Continue taking 7.5 mg every Mon, Wed, Fri and 5 mg all other days. Recheck INR in 2 weeks.     Patient Instructions  INR at goal. HOLD for 5 days prior to procedure. Restart previous maintenance dose if approved by the surgeon the day off the procedure. Continue taking 7.5 mg every Mon, Wed, Fri and 5 mg all other days. Recheck in 2 weeks.  Patient advised to contact clinic or seek medical attention if signs/symptoms of bleeding or thromboembolism occur.  Patient verbalized understanding by repeating back information and was advised to contact me if  further medication-related questions arise.   Follow-up Return in about 2 weeks (around 01/17/2020).  [name], PharmD  15 minutes spent face-to-face with the patient during the encounter. 50% of time spent on education, including signs/sx bleeding and clotting, as well as food and drug interactions with warfarin. 50% of time was spent on fingerprick POC INR sample collection,processing, results determination, and documentation

## 2020-01-05 ENCOUNTER — Other Ambulatory Visit (HOSPITAL_COMMUNITY)
Admission: RE | Admit: 2020-01-05 | Discharge: 2020-01-05 | Disposition: A | Discharge status: Home / Self Care | Payer: Medicare Other | Source: Ambulatory Visit | Attending: Gastroenterology | Admitting: Gastroenterology

## 2020-01-05 DIAGNOSIS — Z20822 Contact with and (suspected) exposure to covid-19: Secondary | ICD-10-CM | POA: Diagnosis not present

## 2020-01-05 DIAGNOSIS — Z01812 Encounter for preprocedural laboratory examination: Secondary | ICD-10-CM | POA: Insufficient documentation

## 2020-01-05 LAB — SARS CORONAVIRUS 2 (TAT 6-24 HRS): SARS Coronavirus 2: NEGATIVE

## 2020-01-06 ENCOUNTER — Encounter (HOSPITAL_COMMUNITY): Payer: Self-pay | Admitting: Gastroenterology

## 2020-01-06 NOTE — Progress Notes (Signed)
Pre op call complete for endoscopy procedure on Monday 01/09/20. Patient confirms she has been quarantined since [place] test and will remain so until procedure, will be NPO at midnight before procedure, and has a driver taking her home post procedure. All questions addressed.

## 2020-01-09 ENCOUNTER — Encounter (HOSPITAL_COMMUNITY)
Admission: RE | Disposition: A | Discharge status: Home / Self Care | Payer: Self-pay | Source: Home / Self Care | Attending: Gastroenterology

## 2020-01-09 ENCOUNTER — Encounter (HOSPITAL_COMMUNITY): Payer: Self-pay | Admitting: Gastroenterology

## 2020-01-09 ENCOUNTER — Other Ambulatory Visit: Payer: Self-pay

## 2020-01-09 ENCOUNTER — Ambulatory Visit (HOSPITAL_BASED_OUTPATIENT_CLINIC_OR_DEPARTMENT_OTHER)
Admission: RE | Admit: 2020-01-09 | Discharge: 2020-01-09 | Disposition: A | Discharge status: Home / Self Care | Payer: Medicare Other | Source: Home / Self Care | Attending: Gastroenterology | Admitting: Gastroenterology

## 2020-01-09 ENCOUNTER — Ambulatory Visit (HOSPITAL_COMMUNITY): Payer: Medicare Other | Admitting: Anesthesiology

## 2020-01-09 DIAGNOSIS — K317 Polyp of stomach and duodenum: Secondary | ICD-10-CM

## 2020-01-09 DIAGNOSIS — K2971 Gastritis, unspecified, with bleeding: Secondary | ICD-10-CM

## 2020-01-09 DIAGNOSIS — Z91040 Latex allergy status: Secondary | ICD-10-CM | POA: Insufficient documentation

## 2020-01-09 DIAGNOSIS — Z882 Allergy status to sulfonamides status: Secondary | ICD-10-CM | POA: Insufficient documentation

## 2020-01-09 DIAGNOSIS — K3189 Other diseases of stomach and duodenum: Secondary | ICD-10-CM | POA: Diagnosis not present

## 2020-01-09 DIAGNOSIS — I120 Hypertensive chronic kidney disease with stage 5 chronic kidney disease or end stage renal disease: Secondary | ICD-10-CM | POA: Insufficient documentation

## 2020-01-09 DIAGNOSIS — N186 End stage renal disease: Secondary | ICD-10-CM | POA: Insufficient documentation

## 2020-01-09 DIAGNOSIS — G4730 Sleep apnea, unspecified: Secondary | ICD-10-CM | POA: Insufficient documentation

## 2020-01-09 DIAGNOSIS — Z880 Allergy status to penicillin: Secondary | ICD-10-CM | POA: Insufficient documentation

## 2020-01-09 DIAGNOSIS — K2950 Unspecified chronic gastritis without bleeding: Secondary | ICD-10-CM | POA: Insufficient documentation

## 2020-01-09 DIAGNOSIS — D132 Benign neoplasm of duodenum: Secondary | ICD-10-CM | POA: Diagnosis not present

## 2020-01-09 DIAGNOSIS — Z888 Allergy status to other drugs, medicaments and biological substances status: Secondary | ICD-10-CM | POA: Insufficient documentation

## 2020-01-09 DIAGNOSIS — Z992 Dependence on renal dialysis: Secondary | ICD-10-CM | POA: Insufficient documentation

## 2020-01-09 HISTORY — PX: EUS: SHX5427

## 2020-01-09 HISTORY — PX: HEMOSTASIS CLIP PLACEMENT: SHX6857

## 2020-01-09 HISTORY — DX: Personal history of other medical treatment: Z92.89

## 2020-01-09 HISTORY — DX: Cardiac arrhythmia, unspecified: I49.9

## 2020-01-09 HISTORY — DX: Unspecified osteoarthritis, unspecified site: M19.90

## 2020-01-09 HISTORY — DX: Depression, unspecified: F32.A

## 2020-01-09 HISTORY — PX: BIOPSY: SHX5522

## 2020-01-09 HISTORY — PX: SCLEROTHERAPY: SHX6841

## 2020-01-09 HISTORY — PX: ESOPHAGOGASTRODUODENOSCOPY (EGD) WITH PROPOFOL: SHX5813

## 2020-01-09 HISTORY — DX: Headache, unspecified: R51.9

## 2020-01-09 HISTORY — PX: ENDOSCOPIC MUCOSAL RESECTION: SHX6839

## 2020-01-09 HISTORY — DX: Other complications of anesthesia, initial encounter: T88.59XA

## 2020-01-09 HISTORY — DX: Unspecified asthma, uncomplicated: J45.909

## 2020-01-09 HISTORY — PX: SUBMUCOSAL LIFTING INJECTION: SHX6855

## 2020-01-09 LAB — POCT I-STAT, CHEM 8
BUN: 43 mg/dL — ABNORMAL HIGH (ref 8–23)
BUN: 39 mg/dL — ABNORMAL HIGH (ref 8–23)
Calcium, Ion: 0.86 mmol/L — CL (ref 1.15–1.40)
Calcium, Ion: 1.02 mmol/L — ABNORMAL LOW (ref 1.15–1.40)
Chloride: 101 mmol/L (ref 98–111)
Chloride: 99 mmol/L (ref 98–111)
Creatinine, Ser: 11.1 mg/dL — ABNORMAL HIGH (ref 0.44–1.00)
Creatinine, Ser: 10.9 mg/dL — ABNORMAL HIGH (ref 0.44–1.00)
Glucose, Bld: 95 mg/dL (ref 70–99)
Glucose, Bld: 92 mg/dL (ref 70–99)
HCT: 37 % (ref 36.0–46.0)
HCT: 36 % (ref 36.0–46.0)
Hemoglobin: 12.6 g/dL (ref 12.0–15.0)
Hemoglobin: 12.2 g/dL (ref 12.0–15.0)
Potassium: 4.9 mmol/L (ref 3.5–5.1)
Potassium: 4.1 mmol/L (ref 3.5–5.1)
Sodium: 140 mmol/L (ref 135–145)
Sodium: 142 mmol/L (ref 135–145)
TCO2: 28 mmol/L (ref 22–32)
TCO2: 31 mmol/L (ref 22–32)

## 2020-01-09 LAB — GLUCOSE, CAPILLARY: Glucose-Capillary: 94 mg/dL (ref 70–99)

## 2020-01-09 SURGERY — ESOPHAGOGASTRODUODENOSCOPY (EGD) WITH PROPOFOL
Anesthesia: Monitor Anesthesia Care

## 2020-01-09 MED ORDER — SODIUM CHLORIDE 0.9 % IV SOLN
INTRAVENOUS | Status: AC | PRN
  Administered 2020-01-09: 1000 mL via INTRAMUSCULAR

## 2020-01-09 MED ORDER — PHENYLEPHRINE 40 MCG/ML (10ML) SYRINGE FOR IV PUSH (FOR BLOOD PRESSURE SUPPORT)
PREFILLED_SYRINGE | INTRAVENOUS | Status: DC | PRN
  Administered 2020-01-09: 80 ug via INTRAVENOUS
  Administered 2020-01-09 (×2): 120 ug via INTRAVENOUS
  Administered 2020-01-09: 80 ug via INTRAVENOUS

## 2020-01-09 MED ORDER — ONDANSETRON HCL 4 MG/2ML IJ SOLN
INTRAMUSCULAR | Status: DC | PRN
  Administered 2020-01-09: 4 mg via INTRAVENOUS

## 2020-01-09 MED ORDER — SUGAMMADEX SODIUM 200 MG/2ML IV SOLN
INTRAVENOUS | Status: DC | PRN
  Administered 2020-01-09: 200 mg via INTRAVENOUS

## 2020-01-09 MED ORDER — SODIUM CHLORIDE 0.9 % IV SOLN: INTRAVENOUS | Status: DC

## 2020-01-09 MED ORDER — ALBUMIN HUMAN 5 % IV SOLN: INTRAVENOUS | Status: DC | PRN

## 2020-01-09 MED ORDER — PHENYLEPHRINE HCL-NACL 10-0.9 MG/250ML-% IV SOLN
INTRAVENOUS | Status: DC | PRN
  Administered 2020-01-09: 55 ug/min via INTRAVENOUS

## 2020-01-09 MED ORDER — ROCURONIUM BROMIDE 100 MG/10ML IV SOLN
INTRAVENOUS | Status: DC | PRN
  Administered 2020-01-09: 50 mg via INTRAVENOUS

## 2020-01-09 MED ORDER — SUCCINYLCHOLINE CHLORIDE 200 MG/10ML IV SOSY
PREFILLED_SYRINGE | INTRAVENOUS | Status: DC | PRN
  Administered 2020-01-09: 120 mg via INTRAVENOUS

## 2020-01-09 MED ORDER — LIDOCAINE 2% (20 MG/ML) 5 ML SYRINGE
INTRAMUSCULAR | Status: DC | PRN
  Administered 2020-01-09: 80 mg via INTRAVENOUS

## 2020-01-09 MED ORDER — SODIUM CHLORIDE (PF) 0.9 % IJ SOLN
PREFILLED_SYRINGE | INTRAMUSCULAR | Status: DC | PRN
  Administered 2020-01-09: 2.5 mL

## 2020-01-09 MED ORDER — PROPOFOL 10 MG/ML IV BOLUS
INTRAVENOUS | Status: DC | PRN
  Administered 2020-01-09: 140 mg via INTRAVENOUS

## 2020-01-09 MED ORDER — GLUCAGON HCL RDNA (DIAGNOSTIC) 1 MG IJ SOLR
INTRAMUSCULAR | Status: DC | PRN
Start: 2020-01-09 — End: 2020-01-09
  Administered 2020-01-09: .25 mg via INTRAVENOUS

## 2020-01-09 MED ORDER — OMEPRAZOLE 40 MG PO CPDR: 40.0000 mg | DELAYED_RELEASE_CAPSULE | Freq: Two times a day (BID) | ORAL | 2 refills | Status: DC

## 2020-01-09 MED ORDER — FENTANYL CITRATE (PF) 250 MCG/5ML IJ SOLN
INTRAMUSCULAR | Status: DC | PRN
  Administered 2020-01-09: 25 ug via INTRAVENOUS
  Administered 2020-01-09: 50 ug via INTRAVENOUS
  Administered 2020-01-09: 25 ug via INTRAVENOUS

## 2020-01-09 SURGICAL SUPPLY — 14 items

## 2020-01-09 NOTE — Transfer of Care (Signed)
Immediate Anesthesia Transfer of Care Note  Patient: Jane [name]  Procedure(s) Performed: ESOPHAGOGASTRODUODENOSCOPY (EGD) WITH PROPOFOL (N/A ) UPPER ENDOSCOPIC ULTRASOUND (EUS) RADIAL (N/A ) ENDOSCOPIC MUCOSAL RESECTION (N/A ) SUBMUCOSAL LIFTING INJECTION POLYPECTOMY HEMOSTASIS CLIP PLACEMENT SCLEROTHERAPY  Patient Location: PACU  Anesthesia Type:General  Level of Consciousness: awake and alert   Airway & Oxygen Therapy: Patient Spontanous Breathing and Patient connected to nasal cannula oxygen  Post-op Assessment: Report given to RN and Post -op Vital signs reviewed and stable  Post vital signs: Reviewed and stable  Last Vitals:  Vitals Value Taken Time  BP 140/61 01/09/20 1817  Temp    Pulse 68 01/09/20 1819  Resp 14 01/09/20 1819  SpO2 92 % 01/09/20 1819  Vitals shown include unvalidated device data.  Last Pain:  Vitals:   01/09/20 1020  TempSrc: Oral  PainSc: 0-No pain         Complications: No complications documented.

## 2020-01-09 NOTE — Op Note (Signed)
[facility] Patient Name: Jane [name] Procedure Date : 01/09/2020 MRN: [id number] Attending MD: Justice Britain , MD Date of Birth: 20-Jul-1946 CSN: [id number] Age: 74 Admit Type: Inpatient Procedure:                Upper EUS Indications:              Duodenal mucosal mass/polyp found on endoscopy,                            Polyps in the duodenum, For therapy of polyps in                            the duodenum Providers:                Justice Britain, MD, Carlyn Reichert, RN, Lazaro Arms, Technician Referring MD:             Lajuan Lines. Hilarie Fredrickson, MD, [name]. [name] Medicines:                General Anesthesia Complications:            No immediate complications. Estimated Blood Loss:     Estimated blood loss was minimal. Procedure:                Pre-Anesthesia Assessment:                           - Prior to the procedure, a History and Physical                            was performed, and patient medications and                            allergies were reviewed. The patient's tolerance of                            previous anesthesia was also reviewed. The risks                            and benefits of the procedure and the sedation                            options and risks were discussed with the patient.                            All questions were answered, and informed consent                            was obtained. Prior Anticoagulants: The patient has                            taken Coumadin (warfarin), last dose was 5 days  prior to procedure. ASA Grade Assessment: III - A                            patient with severe systemic disease. After                            reviewing the risks and benefits, the patient was                            deemed in satisfactory condition to undergo the                            procedure.                           After obtaining informed consent, the  endoscope was                            passed under direct vision. Throughout the                            procedure, the patient's blood pressure, pulse, and                            oxygen saturations were monitored continuously. The                            [id number]-[id number] ([id number]) Olympus pediatric colonscope                            was introduced through the mouth, and advanced to                            the jejunum. The [id number]-[id number] ([id number]) [place] was introduced through the mouth, and                            advanced to the second part of duodenum. The                            [id number]-[id number] ([id number]) Olympus Radial EUS scope was                            introduced through the mouth, and advanced to the                            duodenum for ultrasound examination from the                            stomach and duodenum. The upper EUS was technically  difficult and complex. Successful completion of the                            procedure was aided by performing the maneuvers                            documented (below) in this report. The patient                            tolerated the procedure. Scope In: Scope Out: Findings:      ENDOSCOPIC FINDING: :      No gross lesions were noted in the entire esophagus.      The Z-line was regular and was found 39 cm from the incisors.      Segmental moderate inflammation with hemorrhage characterized by       erosions, erythema, friability and granularity was found in the cardia,       in the gastric body, at the incisura and in the gastric antrum. Biopsies       were taken with a cold forceps for histology and Helicobacter pylori       testing.      One 18 mm pedunculated polyp with bleeding and stigmata of recent       bleeding was found on the greater curvature of the gastric body.       Preparations were made for mucosal resection. A 1:100000 solution  of       saline/epinephrine was injected to raise the lesion (total of 2.5 mL       used). Snare mucosal resection was performed. Resection and retrieval       were complete. To prevent bleeding after mucosal resection, three       hemostatic clips were successfully placed (MR conditional). There was no       bleeding at the end of the procedure.      The major papilla and minor papilla were normal.      A single 45 mm semi-sessile polyp with no bleeding was found in the       second portion/third portion of the duodenum extending for at least 3-4       cm and encompassing >50% of the left lateral/inferior wall - it is       distal to the ampulla. After the EUS was completed, preparations were       made for mucosal resection attempt. Even using the duodenoscope I could       not get a good enough stable position for single attempt at resection.       Orise gel was injected to raise the lesion and greater than 90% of the       lesion lifted. The areas that were not able to lift were not accessible       with needle. I proceeded with attempt. Piecemeal mucosal resection using       a snare was performed. Resection and retrieval were complete. To prevent       bleeding after mucosal resection, seven hemostatic clips were       successfully placed (MR conditional) - though complete closure of the       defect was not possible due to the size of the resection. There was no       active bleeding at the end of the procedure.  No other gross lesions were noted in the entire examined duodenum.      The examined proximal jejunum was normal.      ENDOSONOGRAPHIC FINDING: :      A hypoechoic sessile lesion was identified endosonographically in the       second portion of the duodenum (distal to ampulla). The mass measured 35       mm by 15 mm in maximal cross-sectional diameter. The lesion extended       from the mucosa to the muscularis mucosa. The outer margins were smooth.       There was  sonographic evidence suggesting invasion into the deep mucosa       (Layer 2).      Endosonographic imaging in the visualized portion of the liver showed no       mass-lesion. Impression:               - No gross lesions in esophagus. Z-line regular, 39                            cm from the incisors.                           - Gastritis with hemorrhage. Biopsied.                           - One gastric polyp. Resected and retrieved. Clips                            (MR conditional) were placed.                           - Normal major papilla and minor papilla.                           - A single large duodenal polyp. Resected via                            mucosal resection in piecemeal fashion and                            retrieved. Clips (MR conditional) were placed to                            attempt closure of the defect but could not close                            the entirety of the defect.                           - Otherwise, no gross lesions in the entire                            examined duodenum.                           - Normal examined proximal jejunum.  EUS Impression:                           - A lesion was found in the second portion of the                            duodenum. Tissue has not been obtained. However,                            the clinical appearance is consistent with an                            adenoma (previously biopsied as TVA). Recommendation:           - The patient will be observed post-procedure,                            until all discharge criteria are met.                           - Discharge patient to home.                           - Patient has a contact number available for                            emergencies. The signs and symptoms of potential                            delayed complications were discussed with the                            patient. Return to normal activities tomorrow.                             Written discharge instructions were provided to the                            patient.                           - Clear liquid diet today.                           - Soft diet starting tomorrow.                           - Increase to Omeprazole 40 mg twice daily for                            72-month then 40 mg daily thereafter (Rx sent to                            pharmacy).                           -  May restart Coumadin on 6/24 PM (full 72 hours                            off to allow [place] decreased risk of bleeding                            post-intervention).                           - No aspirin, ibuprofen, naproxen, or other                            non-steroidal anti-inflammatory drugs for 3 weeks                            after polyp removal.                           - Await path results.                           - Repeat the EGD in 3-4 months for surveillance.                           - Plan for CBC on Thursday in [place] office.                           - Recommend following up with Dr. Barry Dienes (they                            should be calling you to schedule in the next                            couple of days). If any of the tissue returns as                            cancer/malignancy then resection will be necessary.                            This particular polyp and region for resection is                            higher risk for recurrence. We have some other                            endoscopic therapies that may be helpful or useful                            if polyp recurrence is noted at follow up, but it                            will be helpful for guaging in future, if a  surgical resection is required what those steps and                            intervention could entail.                           - If any evidence of bleeding (vomiting blood or                            old blood or dark  stools or red stools), abdominal                            pain (not tolerable with Pepto-Bismol/Mylanta or                            Tylenol use), nausea/vomiting (persistent and not                            tolerable), please call the office or [facility] provider for assistance and followup needs                            or role of evaluation in the ED.                           - The findings and recommendations were discussed                            with the patient.                           - The findings and recommendations were discussed                            with the patient's family. Procedure Code(s):        --- Professional ---                           [id number], Esophagogastroduodenoscopy, flexible,                            transoral; with endoscopic mucosal resection                           43237, Esophagogastroduodenoscopy, flexible,                            transoral; with endoscopic ultrasound examination                            limited to the esophagus, stomach or duodenum, and  adjacent structures Diagnosis Code(s):        --- Professional ---                           K29.71, Gastritis, unspecified, with bleeding                           K31.7, Polyp of stomach and duodenum                           K31.89, Other diseases of stomach and duodenum CPT copyright 2019 American Medical Association. All rights reserved. The codes documented in this report are preliminary and upon coder review may  be revised to meet current compliance requirements. Justice Britain, MD 01/09/2020 6:33:00 PM Number of Addenda: 0

## 2020-01-09 NOTE — Anesthesia Postprocedure Evaluation (Signed)
Anesthesia Post Note  Patient: Nadine Vasey  Procedure(s) Performed: ESOPHAGOGASTRODUODENOSCOPY (EGD) WITH PROPOFOL (N/A ) UPPER ENDOSCOPIC ULTRASOUND (EUS) RADIAL (N/A ) ENDOSCOPIC MUCOSAL RESECTION (N/A ) SUBMUCOSAL LIFTING INJECTION POLYPECTOMY HEMOSTASIS CLIP PLACEMENT     Patient location during evaluation: Endoscopy Anesthesia Type: MAC Level of consciousness: awake Pain management: pain level controlled Vital Signs Assessment: post-procedure vital signs reviewed and stable Respiratory status: spontaneous breathing Cardiovascular status: stable Postop Assessment: no apparent nausea or vomiting Anesthetic complications: no   No complications documented.  Last Vitals:  Vitals:   01/09/20 1020  BP: (!) 195/63  Pulse: 82  Resp: 16  Temp: 36.6 C  SpO2: 97%    Last Pain:  Vitals:   01/09/20 1020  TempSrc: Oral  PainSc: 0-No pain                 Ashawn Rinehart

## 2020-01-09 NOTE — Anesthesia Procedure Notes (Signed)
Procedure Name: Intubation Date/Time: 01/09/2020 3:38 PM Performed by: Kathryne Hitch, CRNA Pre-anesthesia Checklist: Patient identified, Emergency Drugs available, Suction available and Patient being monitored Patient Re-evaluated:Patient Re-evaluated prior to induction Oxygen Delivery Method: Circle system utilized Preoxygenation: Pre-oxygenation with 100% oxygen Induction Type: IV induction Ventilation: Mask ventilation without difficulty Laryngoscope Size: Miller and 3 Grade View: Grade II Tube type: Oral Tube size: 7.0 mm Number of attempts: 1 Airway Equipment and Method: Stylet and Oral airway Placement Confirmation: ETT inserted through vocal cords under direct vision,  positive ETCO2 and breath sounds checked- equal and bilateral Secured at: 22 cm Tube secured with: Tape Dental Injury: Teeth and Oropharynx as per pre-operative assessment

## 2020-01-09 NOTE — Anesthesia Postprocedure Evaluation (Signed)
Anesthesia Post Note  Patient: Jane [name]  Procedure(s) Performed: ESOPHAGOGASTRODUODENOSCOPY (EGD) WITH PROPOFOL (N/A ) UPPER ENDOSCOPIC ULTRASOUND (EUS) RADIAL (N/A ) ENDOSCOPIC MUCOSAL RESECTION (N/A ) SUBMUCOSAL LIFTING INJECTION POLYPECTOMY HEMOSTASIS CLIP PLACEMENT     Patient location during evaluation: PACU Anesthesia Type: General Level of consciousness: awake Pain management: pain level controlled Vital Signs Assessment: post-procedure vital signs reviewed and stable Respiratory status: spontaneous breathing Cardiovascular status: stable Postop Assessment: no apparent nausea or vomiting Anesthetic complications: no   No complications documented.  Last Vitals:  Vitals:   01/09/20 1020  BP: (!) 195/63  Pulse: 82  Resp: 16  Temp: 36.6 C  SpO2: 97%    Last Pain:  Vitals:   01/09/20 1020  TempSrc: Oral  PainSc: 0-No pain                 Mariluz Crespo

## 2020-01-09 NOTE — H&P (Signed)
GASTROENTEROLOGY PROCEDURE H&P NOTE   Primary Care Physician: [name], [name], MD  HPI: Jane [name] is a 74 y.o. female who presents for EGD/EUS with EMR attempt at Duodenal TVA in D2/D3 region.  Past Medical History:  Diagnosis Date  . Acid reflux   . Anemia of chronic disease   . Arthritis   . Asthma   . Atrial fibrillation ([place])   . Bilateral carotid bruits   . Complication of anesthesia    "hard to wake up, I have sleep apnea" no CPAP  . Depression   . Diverticulitis   . Dysrhythmia    Afib  . ESRD (end stage renal disease) ([place])    MWF [facility]  . Headache   . History of blood transfusion   . Hypertension   . Malaise and fatigue   . Orthostatic hypotension   . Shortness of breath    " when I walk to fast"  . Sleep apnea   . Syncope   . Tubulovillous adenoma of colon    Past Surgical History:  Procedure Laterality Date  . AV FISTULA PLACEMENT    . BACK SURGERY     Lumbar fusion L 4 and L 5  . COLONOSCOPY    . ESOPHAGOGASTRODUODENOSCOPY    . IR GENERIC HISTORICAL  07/09/2016   IR US GUIDE VASC ACCESS RIGHT 07/09/2016 Arne Cleveland, MD [facility]  . IR GENERIC HISTORICAL  07/09/2016   IR FLUORO GUIDE CV LINE RIGHT 07/09/2016 Arne Cleveland, MD [facility]  . KNEE ARTHROPLASTY Left   . LAPAROSCOPIC SIGMOID COLECTOMY N/A 07/11/2016   Procedure: LAPAROSCOPIC SIGMOID COLECTOMY;  Surgeon: Clovis Riley, MD;  Location: [facility];  Service: General;  Laterality: N/A;  . TUBAL LIGATION     Current Facility-Administered Medications  Medication Dose Route Frequency Provider Last Rate Last Admin  . 0.9 %  sodium chloride infusion   Intravenous Continuous Mansouraty, Telford Nab., MD      . 0.9 %  sodium chloride infusion    Continuous PRN Mansouraty, Telford Nab., MD 20 mL/hr at 01/09/20 1023 1,000 mL at 01/09/20 1023   Allergies  Allergen Reactions  . Latex Rash  . Penicillins     Yeast infection   . Sulfa Antibiotics Rash  . Tape Other (See Comments)     Plastic, silicone, and paper tape causes bruising and pulls off skin. Cloth tape works fine   Family History  Problem Relation Age of Onset  . Heart failure Mother   . Stroke Mother   . Other Father   . Colon cancer Neg Hx   . Liver disease Neg Hx   . Esophageal cancer Neg Hx   . Stomach cancer Neg Hx   . Inflammatory bowel disease Neg Hx   . Rectal cancer Neg Hx   . Pancreatic cancer Neg Hx    Social History   Socioeconomic History  . Marital status: Divorced    Spouse name: Not on file  . Number of children: 4  . Years of education: 11  . Highest education level: Associate degree: occupational, Hotel manager, or vocational program  Occupational History  . Occupation: Retired  Tobacco Use  . Smoking status: Never Smoker  . Smokeless tobacco: Never Used  Vaping Use  . Vaping Use: Never used  Substance and Sexual Activity  . Alcohol use: No  . Drug use: No  . Sexual activity: Never  Other Topics Concern  . Not on file  Social History Narrative  HH 1   Divorced   Outpatient dialysis Mon, Wed, Fri   4 children: 1 daughter locally is an Designer, multimedia and 3 sons in [place] Strain: High Risk  . Difficulty of Paying Living Expenses: Very hard  Food Insecurity: No Food Insecurity  . Worried About Charity fundraiser in the Last Year: Never true  . Ran Out of Food in the Last Year: Never true  Transportation Needs: No Transportation Needs  . Lack of Transportation (Medical): No  . Lack of Transportation (Non-Medical): No  Physical Activity:   . Days of Exercise per Week:   . Minutes of Exercise per Session:   Stress: Stress Concern Present  . Feeling of Stress : Rather much  Social Connections: Moderately Integrated  . Frequency of Communication with Friends and Family: More than three times a week  . Frequency of Social Gatherings with Friends and Family: Once a week  . Attends Religious Services: More than 4  times per year  . Active Member of Clubs or Organizations: Yes  . Attends Archivist Meetings: Not on file  . Marital Status: Divorced  Human resources officer Violence:   . Fear of Current or Ex-Partner:   . Emotionally Abused:   Marland Kitchen Physically Abused:   . Sexually Abused:     Physical Exam: Vital signs in last 24 hours: Temp:  [97.8 F (36.6 C)] 97.8 F (36.6 C) (06/21 1020) Pulse Rate:  [82] 82 (06/21 1020) Resp:  [16] 16 (06/21 1020) BP: (195)/(63) 195/63 (06/21 1020) SpO2:  [97 %] 97 % (06/21 1020) Weight:  [105.2 kg] 105.2 kg (06/21 1020)   GEN: NAD EYE: Sclerae anicteric ENT: MMM CV: Non-tachycardic GI: Soft, protuberant abdomen, distended, mild TTP in midabdomen NEURO:  Alert & Oriented x 3  Lab Results: Recent Labs    01/09/20 1021 01/09/20 1118  HGB 12.6 12.2  HCT 37.0 36.0   BMET Recent Labs    01/09/20 1021 01/09/20 1118  NA 140 142  K 4.9 4.1  CL 101 99  GLUCOSE 95 92  BUN 43* 39*  CREATININE 11.10* 10.90*   LFT No results for input(s): PROT, ALBUMIN, AST, ALT, ALKPHOS, BILITOT, BILIDIR, IBILI in the last 72 hours. PT/INR No results for input(s): LABPROT, INR in the last 72 hours.   Impression / Plan: This is a 74 y.o.female who presents for EGD/EUS with EMR attempt at Duodenal TVA in D2/D3 region.  The risks of an EUS including intestinal perforation, bleeding, infection, aspiration, and medication effects were discussed as was the possibility it may not give a definitive diagnosis if a biopsy is performed.  When a biopsy of the pancreas is done as part of the EUS, there is an additional risk of pancreatitis at the rate of about 1-2%.  It was explained that procedure related pancreatitis is typically mild, although it can be severe and even life threatening, which is why we do not perform random pancreatic biopsies and only biopsy a lesion/area we feel is concerning enough to warrant the risk.  The risks and benefits of endoscopic evaluation  were discussed with the patient; these include but are not limited to the risk of perforation, infection, bleeding, missed lesions, lack of diagnosis, severe illness requiring hospitalization, as well as anesthesia and sedation related illnesses.  The patient is agreeable to proceed.    Justice Britain, MD [facility] Office # [phone]

## 2020-01-09 NOTE — Anesthesia Preprocedure Evaluation (Addendum)
Anesthesia Evaluation  Patient identified by MRN, date of birth, ID band Patient awake    Reviewed: Allergy & Precautions, NPO status , Patient's Chart, lab work & pertinent test results  Airway Mallampati: II  TM Distance: >3 FB     Dental   Pulmonary    breath sounds clear to auscultation       Cardiovascular hypertension, + dysrhythmias  Rhythm:Regular Rate:Normal     Neuro/Psych    GI/Hepatic Neg liver ROS, GERD  ,History noted CG   Endo/Other    Renal/GU Renal disease     Musculoskeletal   Abdominal   Peds  Hematology  (+) anemia ,   Anesthesia Other Findings   Reproductive/Obstetrics                             Anesthesia Physical Anesthesia Plan  ASA: III  Anesthesia Plan: MAC   Post-op Pain Management:    Induction: Intravenous  PONV Risk Score and Plan: 2 and Ondansetron and Midazolam  Airway Management Planned: Nasal Cannula and Simple Face Mask  Additional Equipment:   Intra-op Plan:   Post-operative Plan:   Informed Consent: I have reviewed the patients History and Physical, chart, labs and discussed the procedure including the risks, benefits and alternatives for the proposed anesthesia with the patient or authorized representative who has indicated his/her understanding and acceptance.     Dental advisory given  Plan Discussed with: CRNA  Anesthesia Plan Comments:         Anesthesia Quick Evaluation

## 2020-01-10 ENCOUNTER — Other Ambulatory Visit: Payer: Self-pay | Admitting: Physician Assistant

## 2020-01-10 ENCOUNTER — Telehealth: Payer: Self-pay

## 2020-01-10 NOTE — Telephone Encounter (Signed)
Referral was made and records faxed

## 2020-01-10 NOTE — Telephone Encounter (Signed)
-----   Message from Irving Copas., MD sent at 01/10/2020  8:30 AM EDT ----- Was able to remove the polyp in pieces. We will see pathology. I spoke with the patient and her daughter and I still think reasonable to proceed with clinic visit, but it is less urgent since I removed the polyp. High risk for recurrence, but we will have to wait and see. Thanks. GM ----- Message ----- From: Irving Copas., MD Sent: 01/09/2020   3:33 PM EDT To: Timothy Lasso, RN, Irving Copas., MD  OK. Thanks for update. I'll let you know if we were able to get the lesion off or not. Thanks. GM ----- Message ----- From: Timothy Lasso, RN Sent: 01/09/2020   3:31 PM EDT To: Irving Copas., MD  [facility] has not scheduled her for an appt as of today.  I was told they will get her scheduled and call her tomorrow.

## 2020-01-11 DIAGNOSIS — H47011 Ischemic optic neuropathy, right eye: Secondary | ICD-10-CM | POA: Diagnosis not present

## 2020-01-11 DIAGNOSIS — H2512 Age-related nuclear cataract, left eye: Secondary | ICD-10-CM | POA: Diagnosis not present

## 2020-01-11 DIAGNOSIS — H5201 Hypermetropia, right eye: Secondary | ICD-10-CM | POA: Diagnosis not present

## 2020-01-11 DIAGNOSIS — H52221 Regular astigmatism, right eye: Secondary | ICD-10-CM | POA: Diagnosis not present

## 2020-01-12 ENCOUNTER — Encounter (HOSPITAL_COMMUNITY): Payer: Self-pay | Admitting: Gastroenterology

## 2020-01-12 ENCOUNTER — Telehealth: Payer: Self-pay | Admitting: Internal Medicine

## 2020-01-12 ENCOUNTER — Other Ambulatory Visit (INDEPENDENT_AMBULATORY_CARE_PROVIDER_SITE_OTHER): Payer: Medicare Other

## 2020-01-12 ENCOUNTER — Inpatient Hospital Stay (HOSPITAL_COMMUNITY)
Admission: EM | Admit: 2020-01-12 | Discharge: 2020-01-19 | DRG: 919 | Disposition: A | Discharge status: Home / Self Care | Payer: Medicare Other | Source: Ambulatory Visit | Attending: Internal Medicine | Admitting: Internal Medicine

## 2020-01-12 ENCOUNTER — Other Ambulatory Visit: Payer: Self-pay | Admitting: Gastroenterology

## 2020-01-12 ENCOUNTER — Other Ambulatory Visit: Payer: Self-pay

## 2020-01-12 DIAGNOSIS — K317 Polyp of stomach and duodenum: Secondary | ICD-10-CM | POA: Diagnosis present

## 2020-01-12 DIAGNOSIS — K3189 Other diseases of stomach and duodenum: Secondary | ICD-10-CM

## 2020-01-12 DIAGNOSIS — Y838 Other surgical procedures as the cause of abnormal reaction of the patient, or of later complication, without mention of misadventure at the time of the procedure: Secondary | ICD-10-CM | POA: Diagnosis present

## 2020-01-12 DIAGNOSIS — K219 Gastro-esophageal reflux disease without esophagitis: Secondary | ICD-10-CM | POA: Diagnosis present

## 2020-01-12 DIAGNOSIS — K31A0 Gastric intestinal metaplasia, unspecified: Secondary | ICD-10-CM

## 2020-01-12 DIAGNOSIS — Z20822 Contact with and (suspected) exposure to covid-19: Secondary | ICD-10-CM | POA: Diagnosis present

## 2020-01-12 DIAGNOSIS — I129 Hypertensive chronic kidney disease with stage 1 through stage 4 chronic kidney disease, or unspecified chronic kidney disease: Secondary | ICD-10-CM | POA: Diagnosis not present

## 2020-01-12 DIAGNOSIS — D638 Anemia in other chronic diseases classified elsewhere: Secondary | ICD-10-CM | POA: Diagnosis present

## 2020-01-12 DIAGNOSIS — K264 Chronic or unspecified duodenal ulcer with hemorrhage: Secondary | ICD-10-CM | POA: Diagnosis present

## 2020-01-12 DIAGNOSIS — K91840 Postprocedural hemorrhage and hematoma of a digestive system organ or structure following a digestive system procedure: Principal | ICD-10-CM | POA: Diagnosis present

## 2020-01-12 DIAGNOSIS — Z7901 Long term (current) use of anticoagulants: Secondary | ICD-10-CM

## 2020-01-12 DIAGNOSIS — K2971 Gastritis, unspecified, with bleeding: Secondary | ICD-10-CM | POA: Diagnosis present

## 2020-01-12 DIAGNOSIS — D62 Acute posthemorrhagic anemia: Secondary | ICD-10-CM | POA: Diagnosis present

## 2020-01-12 DIAGNOSIS — K922 Gastrointestinal hemorrhage, unspecified: Secondary | ICD-10-CM

## 2020-01-12 DIAGNOSIS — E1122 Type 2 diabetes mellitus with diabetic chronic kidney disease: Secondary | ICD-10-CM | POA: Diagnosis present

## 2020-01-12 DIAGNOSIS — Z992 Dependence on renal dialysis: Secondary | ICD-10-CM

## 2020-01-12 DIAGNOSIS — G4733 Obstructive sleep apnea (adult) (pediatric): Secondary | ICD-10-CM | POA: Diagnosis present

## 2020-01-12 DIAGNOSIS — E8889 Other specified metabolic disorders: Secondary | ICD-10-CM | POA: Diagnosis present

## 2020-01-12 DIAGNOSIS — I158 Other secondary hypertension: Secondary | ICD-10-CM | POA: Diagnosis not present

## 2020-01-12 DIAGNOSIS — I10 Essential (primary) hypertension: Secondary | ICD-10-CM | POA: Diagnosis not present

## 2020-01-12 DIAGNOSIS — K449 Diaphragmatic hernia without obstruction or gangrene: Secondary | ICD-10-CM | POA: Diagnosis present

## 2020-01-12 DIAGNOSIS — N186 End stage renal disease: Secondary | ICD-10-CM

## 2020-01-12 DIAGNOSIS — K921 Melena: Secondary | ICD-10-CM | POA: Diagnosis not present

## 2020-01-12 DIAGNOSIS — K644 Residual hemorrhoidal skin tags: Secondary | ICD-10-CM | POA: Diagnosis present

## 2020-01-12 DIAGNOSIS — I5032 Chronic diastolic (congestive) heart failure: Secondary | ICD-10-CM | POA: Diagnosis present

## 2020-01-12 DIAGNOSIS — I132 Hypertensive heart and chronic kidney disease with heart failure and with stage 5 chronic kidney disease, or end stage renal disease: Secondary | ICD-10-CM | POA: Diagnosis present

## 2020-01-12 DIAGNOSIS — Z96652 Presence of left artificial knee joint: Secondary | ICD-10-CM | POA: Diagnosis present

## 2020-01-12 DIAGNOSIS — K5909 Other constipation: Secondary | ICD-10-CM | POA: Diagnosis present

## 2020-01-12 DIAGNOSIS — I480 Paroxysmal atrial fibrillation: Secondary | ICD-10-CM | POA: Diagnosis not present

## 2020-01-12 DIAGNOSIS — J45909 Unspecified asthma, uncomplicated: Secondary | ICD-10-CM | POA: Diagnosis present

## 2020-01-12 DIAGNOSIS — Z6841 Body Mass Index (BMI) 40.0 and over, adult: Secondary | ICD-10-CM

## 2020-01-12 DIAGNOSIS — N2581 Secondary hyperparathyroidism of renal origin: Secondary | ICD-10-CM | POA: Diagnosis present

## 2020-01-12 DIAGNOSIS — I951 Orthostatic hypotension: Secondary | ICD-10-CM | POA: Diagnosis present

## 2020-01-12 LAB — SURGICAL PATHOLOGY

## 2020-01-12 LAB — CBC WITH DIFFERENTIAL/PLATELET
Basophils Absolute: 0 10*3/uL (ref 0.0–0.1)
Basophils Relative: 0.6 % (ref 0.0–3.0)
Eosinophils Absolute: 0.8 10*3/uL — ABNORMAL HIGH (ref 0.0–0.7)
Eosinophils Relative: 17.6 % — ABNORMAL HIGH (ref 0.0–5.0)
HCT: 34.5 % — ABNORMAL LOW (ref 36.0–46.0)
Hemoglobin: 11.4 g/dL — ABNORMAL LOW (ref 12.0–15.0)
Lymphocytes Relative: 20.6 % (ref 12.0–46.0)
Lymphs Abs: 0.9 10*3/uL (ref 0.7–4.0)
MCHC: 33 g/dL (ref 30.0–36.0)
MCV: 88.9 fl (ref 78.0–100.0)
Monocytes Absolute: 0.6 10*3/uL (ref 0.1–1.0)
Monocytes Relative: 14.9 % — ABNORMAL HIGH (ref 3.0–12.0)
Neutro Abs: 2 10*3/uL (ref 1.4–7.7)
Neutrophils Relative %: 46.3 % (ref 43.0–77.0)
Platelets: 169 10*3/uL (ref 150.0–400.0)
RBC: 3.88 Mil/uL (ref 3.87–5.11)
RDW: 17 % — ABNORMAL HIGH (ref 11.5–15.5)
WBC: 4.3 10*3/uL (ref 4.0–10.5)

## 2020-01-12 LAB — CBC
HCT: 30.7 % — ABNORMAL LOW (ref 36.0–46.0)
Hemoglobin: 9 g/dL — ABNORMAL LOW (ref 12.0–15.0)
MCH: 28 pg (ref 26.0–34.0)
MCHC: 29.3 g/dL — ABNORMAL LOW (ref 30.0–36.0)
MCV: 95.6 fL (ref 80.0–100.0)
Platelets: 169 10*3/uL (ref 150–400)
RBC: 3.21 MIL/uL — ABNORMAL LOW (ref 3.87–5.11)
RDW: 16.2 % — ABNORMAL HIGH (ref 11.5–15.5)
WBC: 3.8 10*3/uL — ABNORMAL LOW (ref 4.0–10.5)
nRBC: 0 % (ref 0.0–0.2)

## 2020-01-12 LAB — COMPREHENSIVE METABOLIC PANEL
ALT: 12 U/L (ref 0–44)
AST: 15 U/L (ref 15–41)
Albumin: 3 g/dL — ABNORMAL LOW (ref 3.5–5.0)
Alkaline Phosphatase: 90 U/L (ref 38–126)
Anion gap: 13 (ref 5–15)
BUN: 41 mg/dL — ABNORMAL HIGH (ref 8–23)
CO2: 23 mmol/L (ref 22–32)
Calcium: 8.1 mg/dL — ABNORMAL LOW (ref 8.9–10.3)
Chloride: 102 mmol/L (ref 98–111)
Creatinine, Ser: 11.2 mg/dL — ABNORMAL HIGH (ref 0.44–1.00)
GFR calc Af Amer: 3 mL/min — ABNORMAL LOW (ref >60)
GFR calc non Af Amer: 3 mL/min — ABNORMAL LOW (ref >60)
Glucose, Bld: 105 mg/dL — ABNORMAL HIGH (ref 70–99)
Potassium: 4.8 mmol/L (ref 3.5–5.1)
Sodium: 138 mmol/L (ref 135–145)
Total Bilirubin: 0.6 mg/dL (ref 0.3–1.2)
Total Protein: 6 g/dL — ABNORMAL LOW (ref 6.5–8.1)

## 2020-01-12 LAB — PREPARE RBC (CROSSMATCH)

## 2020-01-12 LAB — PROTIME-INR
INR: 1.3 — ABNORMAL HIGH (ref 0.8–1.2)
Prothrombin Time: 15.4 seconds — ABNORMAL HIGH (ref 11.4–15.2)

## 2020-01-12 LAB — SARS CORONAVIRUS 2 BY RT PCR (HOSPITAL ORDER, PERFORMED IN ~~LOC~~ HOSPITAL LAB): SARS Coronavirus 2: NEGATIVE

## 2020-01-12 MED ORDER — SODIUM CHLORIDE 0.9 % IV SOLN
8.0000 mg/h | INTRAVENOUS | Status: AC
  Administered 2020-01-12 – 2020-01-15 (×3): 8 mg/h via INTRAVENOUS
  Filled 2020-01-12 (×6): qty 80

## 2020-01-12 MED ORDER — SODIUM CHLORIDE 0.9% IV SOLUTION: Freq: Once | INTRAVENOUS | Status: DC

## 2020-01-12 MED ORDER — ACETAMINOPHEN 325 MG PO TABS: 650.0000 mg | ORAL_TABLET | Freq: Once | ORAL | Status: DC

## 2020-01-12 MED ORDER — DIPHENHYDRAMINE HCL 25 MG PO CAPS: 25.0000 mg | ORAL_CAPSULE | Freq: Once | ORAL | Status: DC

## 2020-01-12 MED ORDER — ONDANSETRON HCL 4 MG/2ML IJ SOLN
4.0000 mg | Freq: Once | INTRAMUSCULAR | Status: AC
  Administered 2020-01-12: 4 mg via INTRAVENOUS
  Filled 2020-01-12: qty 2

## 2020-01-12 MED ORDER — SODIUM CHLORIDE 0.9 % IV BOLUS
500.0000 mL | Freq: Once | INTRAVENOUS | Status: AC
  Administered 2020-01-12: 500 mL via INTRAVENOUS

## 2020-01-12 MED ORDER — SODIUM CHLORIDE 0.9 % IV SOLN
80.0000 mg | Freq: Once | INTRAVENOUS | Status: AC
  Administered 2020-01-12: 80 mg via INTRAVENOUS
  Filled 2020-01-12: qty 80

## 2020-01-12 MED ORDER — SODIUM CHLORIDE 0.9 % IV SOLN
10.0000 mL/h | Freq: Once | INTRAVENOUS | Status: AC
  Administered 2020-01-12: 10 mL/h via INTRAVENOUS

## 2020-01-12 NOTE — ED Triage Notes (Addendum)
Dialysis pt, last treatment was Tuesday. Pt had endoscopy done yesterday with polyp removed. Had been taken off her coumadin but started back on it today. Began having large amount of rectal bleeding today. has nausea, denies abd pain.

## 2020-01-12 NOTE — Telephone Encounter (Signed)
Pt called this evening to report this evening she developed maroon stools and now diarrhea with dark red blood.  Had felt constipated and she used stool softener recently Had hgb today at the office at 11.4, had been 12.2  CBC Latest Ref Rng & Units 01/12/2020 01/09/2020 01/09/2020  WBC 4.0 - 10.5 K/uL 4.3 - -  Hemoglobin 12.0 - 15.0 g/dL 11.4(L) 12.2 12.6  Hematocrit 36 - 46 % 34.5(L) 36.0 37.0  Platelets 150 - 400 K/uL 169.0 - -   She is not having pain.  She had gastric polypectomy and EMR of large duodenal mass on 01/09/20 by Dr. Rush Landmark.  I advised she call 911 and be transported to the ER for observation given GI bleeding, very likely post-polypectomy hemorrhage. Will need warfarin held (if she had started it back) Anticipate need to repeat EGD, timing to be determined  She voiced understanding and will alert EMS now

## 2020-01-12 NOTE — ED Provider Notes (Signed)
[facility] EMERGENCY DEPARTMENT Provider Note   CSN: [id number] Arrival date & time: 01/12/20  2706     History Chief Complaint  Patient presents with  . Rectal Bleeding  . Post-op Problem    Jane [name] is a 74 y.o. female.  She has a history of A. fib and end-stage renal disease and last had dialysis 2 days ago.  3 days ago had an upper endoscopy with multiple polyps removed from her stomach and duodenum.  Yesterday she had cataract surgery on her right eye.  Today she started having some generalized abdominal pain and then had an episode of maroon stool.  She has had 6 episodes of rectal bleeding.  Associated with some nausea.  She did take her evening Coumadin dose today.  Called her GI doctor who recommended she come to the emergency department.  Feeling generally weak.  No syncope chest pain shortness of breath.  The history is provided by the patient and a relative.  Rectal Bleeding Quality:  Maroon Amount:  Moderate Timing:  Intermittent Chronicity:  New Context: defecation   Relieved by:  None tried Worsened by:  Defecation Ineffective treatments:  None tried Associated symptoms: abdominal pain   Associated symptoms: no epistaxis, no fever, no hematemesis, no loss of consciousness and no vomiting   Risk factors: anticoagulant use        Past Medical History:  Diagnosis Date  . Acid reflux   . Anemia of chronic disease   . Arthritis   . Asthma   . Atrial fibrillation ([place])   . Bilateral carotid bruits   . Complication of anesthesia    "hard to wake up, I have sleep apnea" no CPAP  . Depression   . Diverticulitis   . Dysrhythmia    Afib  . ESRD (end stage renal disease) ([place])    MWF [place]  . Headache   . History of blood transfusion   . Hypertension   . Malaise and fatigue   . Orthostatic hypotension   . Shortness of breath    " when I walk to fast"  . Sleep apnea   . Syncope   . Tubulovillous adenoma of colon     Patient  Active Problem List   Diagnosis Date Noted  . Monitoring for long-term anticoagulant use 01/03/2020  . Intestinal metaplasia of gastric mucosa 12/04/2019  . Gastric polyp 12/04/2019  . Tubulovillous adenoma of small intestine 12/03/2019  . Adenomatous duodenal polyp 12/03/2019  . Abnormal endoscopy of upper gastrointestinal tract 12/03/2019  . Depression, major, single episode, mild ([place]) 08/02/2019  . Depression, major, recurrent, moderate ([place]) 09/22/2018  . Orthostatic hypotension 09/16/2018  . Bilateral lower extremity edema 08/24/2018  . Abdominal pain, diffuse 05/11/2018  . Body mass index 40.0-44.9, adult ([place]) 03/18/2018  . Unilateral primary osteoarthritis, right knee 03/18/2018  . Chronic pain of right knee 03/18/2018  . Abnormal facial hair 11/10/2017  . Chronic anticoagulation 11/10/2017  . GERD (gastroesophageal reflux disease) 06/16/2017  . Constipation 10/16/2016  . Morbid obesity ([place]) 10/16/2016  . Diverticulitis of large intestine with perforation 06/26/2016  . Anemia in other chronic diseases classified elsewhere 06/25/2016  . Chest pain with moderate risk for cardiac etiology 06/25/2016  . ESRD on hemodialysis ([place]) 06/30/2012  . Paroxysmal atrial fibrillation ([place]) 06/30/2012  . Hypertension with renal disease 06/30/2012    Past Surgical History:  Procedure Laterality Date  . AV FISTULA PLACEMENT    . BACK SURGERY     Lumbar  fusion L 4 and L 5  . BIOPSY  01/09/2020   Procedure: BIOPSY;  Surgeon: Rush Landmark Telford Nab., MD;  Location: [facility];  Service: Gastroenterology;;  . COLONOSCOPY    . ENDOSCOPIC MUCOSAL RESECTION N/A 01/09/2020   Procedure: ENDOSCOPIC MUCOSAL RESECTION;  Surgeon: Rush Landmark Telford Nab., MD;  Location: [facility];  Service: Gastroenterology;  Laterality: N/A;  . ESOPHAGOGASTRODUODENOSCOPY    . ESOPHAGOGASTRODUODENOSCOPY (EGD) WITH PROPOFOL N/A 01/09/2020   Procedure: ESOPHAGOGASTRODUODENOSCOPY (EGD) WITH PROPOFOL;  Surgeon:  Rush Landmark Telford Nab., MD;  Location: [facility] [place];  Service: Gastroenterology;  Laterality: N/A;  . EUS N/A 01/09/2020   Procedure: UPPER ENDOSCOPIC ULTRASOUND (EUS) RADIAL;  Surgeon: Rush Landmark Telford Nab., MD;  Location: [facility];  Service: Gastroenterology;  Laterality: N/A;  . HEMOSTASIS CLIP PLACEMENT  01/09/2020   Procedure: HEMOSTASIS CLIP PLACEMENT;  Surgeon: Irving Copas., MD;  Location: [facility];  Service: Gastroenterology;;  . Everlean Alstrom GENERIC HISTORICAL  07/09/2016   IR US GUIDE VASC ACCESS RIGHT 07/09/2016 Arne Cleveland, MD [facility]  . IR GENERIC HISTORICAL  07/09/2016   IR FLUORO GUIDE CV LINE RIGHT 07/09/2016 Arne Cleveland, MD [facility]  . KNEE ARTHROPLASTY Left   . LAPAROSCOPIC SIGMOID COLECTOMY N/A 07/11/2016   Procedure: LAPAROSCOPIC SIGMOID COLECTOMY;  Surgeon: Clovis Riley, MD;  Location: [facility];  Service: General;  Laterality: N/A;  . SCLEROTHERAPY  01/09/2020   Procedure: Clide Deutscher;  Surgeon: Mansouraty, Telford Nab., MD;  Location: [facility];  Service: Gastroenterology;;  . Lia Foyer LIFTING INJECTION  01/09/2020   Procedure: SUBMUCOSAL LIFTING INJECTION;  Surgeon: Irving Copas., MD;  Location: [facility];  Service: Gastroenterology;;  . TUBAL LIGATION       OB History   No obstetric history on file.     Family History  Problem Relation Age of Onset  . Heart failure Mother   . Stroke Mother   . Other Father   . Colon cancer Neg Hx   . Liver disease Neg Hx   . Esophageal cancer Neg Hx   . Stomach cancer Neg Hx   . Inflammatory bowel disease Neg Hx   . Rectal cancer Neg Hx   . Pancreatic cancer Neg Hx     Social History   Tobacco Use  . Smoking status: Never Smoker  . Smokeless tobacco: Never Used  Vaping Use  . Vaping Use: Never used  Substance Use Topics  . Alcohol use: No  . Drug use: No    Home Medications Prior to Admission medications   Medication Sig Start Date End Date Taking? Authorizing  Provider  acetaminophen (TYLENOL) 500 MG tablet Take 1,500 mg by mouth daily as needed for headache.    [provider]  albuterol (PROVENTIL HFA;VENTOLIN HFA) 108 (90 Base) MCG/ACT inhaler Inhale 1 puff into the lungs every 6 (six) hours as needed for wheezing or shortness of breath. Patient taking differently: Inhale 2 puffs into the lungs every 6 (six) hours as needed for wheezing or shortness of breath.  01/26/18   [name], [name], MD  carvedilol (COREG) 25 MG tablet Take 25 mg by mouth 2 (two) times daily with a meal.    [provider]  cinacalcet (SENSIPAR) 60 MG tablet Take 60 mg by mouth every Monday, Wednesday, and Friday.  07/20/18   [provider]  ethyl chloride spray Apply 1 application topically daily as needed (port access).  12/25/17   [provider]  hydrALAZINE (APRESOLINE) 25 MG tablet Take 1 tablet (25 mg total) by  mouth 3 (three) times daily. 12/22/19   Adrian Prows, MD  isosorbide dinitrate (ISORDIL) 10 MG tablet Take 3 tablets (30 mg total) by mouth 3 (three) times daily. Patient taking differently: Take 20 mg by mouth 3 (three) times daily.  12/22/19   Adrian Prows, MD  Methoxy PEG-Epoetin Beta (MIRCERA IJ) IV with dialysis 10/19/19 10/17/20  [provider]  multivitamin (RENA-VIT) TABS tablet Take 1 tablet by mouth at bedtime.     [provider]  nystatin cream (MYCOSTATIN) Apply 1 application topically 2 (two) times daily. Patient taking differently: Apply 1 application topically 2 (two) times daily as needed (rash).  08/12/19   [name], [name], MD  omeprazole (PRILOSEC) 40 MG capsule Take 1 capsule (40 mg total) by mouth 2 (two) times daily before a meal. 01/09/20   Mansouraty, Telford Nab., MD  polyethylene glycol (MIRALAX / GLYCOLAX) 17 g packet Take 17 g by mouth in the morning, at noon, and at bedtime.    [provider]  senna (SENOKOT) 8.6 MG tablet Take 3 tablets by mouth 2 (two) times daily.    [provider]  sevelamer carbonate (RENVELA) 800 MG tablet Take 1,600-2,400 mg by mouth See admin instructions. Take Take 1600 to 2400 mg with each meal    [provider]  triamcinolone cream (KENALOG) 0.1 % Apply 1 application topically 2 (two) times daily. 08/12/19   [name], [name], MD  warfarin (COUMADIN) 5 MG tablet TAKE 1 & 1/2 (ONE & ONE-HALF) TABLETS BY MOUTH EVERY TUESDAY AND EVERY THURSDAY AND 1 TABLET ONCE DAILY ALL OTHER DAYS Patient taking differently: Take 5-7.5 mg by mouth See admin instructions. Take 7.5 mg at night on Mon, Wed, and Fri. Take 5 mg at night on Sun, Tue, Thurs, and Sat 09/23/19   [name], [name], MD    Allergies    Latex, Penicillins, Sulfa antibiotics, and Tape  Review of Systems   Review of Systems  Constitutional: Negative for fever.  HENT: Negative for nosebleeds and sore throat.   Eyes: Positive for visual disturbance.  Respiratory: Negative for shortness of breath.   Cardiovascular: Negative for chest pain.  Gastrointestinal: Positive for abdominal pain, anal bleeding, hematochezia and nausea. Negative for hematemesis and vomiting.  Genitourinary: Negative for dysuria.  Musculoskeletal: Negative for neck pain.  Skin: Negative for rash.  Neurological: Negative for loss of consciousness and headaches.    Physical Exam Updated Vital Signs BP 91/68 (BP Location: Left Arm)   Pulse 72   Temp 98.5 F (36.9 C) (Oral)   Resp 18   SpO2 98%   Physical Exam Vitals and nursing note reviewed.  Constitutional:      General: She is not in acute distress.    Appearance: Normal appearance. She is well-developed.  HENT:     Head: Normocephalic and atraumatic.  Eyes:     Conjunctiva/sclera: Conjunctivae normal.     Comments: Right eye with plastic shield in place.  Cardiovascular:     Rate and Rhythm: Normal rate and regular rhythm.     Heart sounds: No murmur heard.   Pulmonary:     Effort: Pulmonary effort is normal. No respiratory distress.     Breath  sounds: Normal breath sounds.  Abdominal:     Palpations: Abdomen is soft.     Tenderness: There is no abdominal tenderness. There is no guarding or rebound.  Musculoskeletal:        General: No deformity or signs of injury.  Cervical back: Neck supple.     Comments: Fistula right forearm  Skin:    General: Skin is warm and dry.     Capillary Refill: Capillary refill takes less than 2 seconds.  Neurological:     General: No focal deficit present.     Mental Status: She is alert.     ED Results / Procedures / Treatments   Labs (all labs ordered are listed, but only abnormal results are displayed) Labs Reviewed  COMPREHENSIVE METABOLIC PANEL - Abnormal; Notable for the following components:      Result Value   Glucose, Bld 105 (*)    BUN 41 (*)    Creatinine, Ser 11.20 (*)    Calcium 8.1 (*)    Total Protein 6.0 (*)    Albumin 3.0 (*)    GFR calc non Af Amer 3 (*)    GFR calc Af Amer 3 (*)    All other components within normal limits  CBC - Abnormal; Notable for the following components:   WBC 3.8 (*)    RBC 3.21 (*)    Hemoglobin 9.0 (*)    HCT 30.7 (*)    MCHC 29.3 (*)    RDW 16.2 (*)    All other components within normal limits  PROTIME-INR - Abnormal; Notable for the following components:   Prothrombin Time 15.4 (*)    INR 1.3 (*)    All other components within normal limits  PROTIME-INR - Abnormal; Notable for the following components:   Prothrombin Time 15.3 (*)    INR 1.3 (*)    All other components within normal limits  COMPREHENSIVE METABOLIC PANEL - Abnormal; Notable for the following components:   BUN 52 (*)    Creatinine, Ser 12.04 (*)    Calcium 7.8 (*)    Total Protein 5.4 (*)    Albumin 2.7 (*)    AST 14 (*)    GFR calc non Af Amer 3 (*)    GFR calc Af Amer 3 (*)    All other components within normal limits  BRAIN NATRIURETIC PEPTIDE - Abnormal; Notable for the following components:   B Natriuretic Peptide 122.4 (*)    All other components  within normal limits  SARS CORONAVIRUS 2 BY RT PCR (HOSPITAL ORDER, [facility])  MRSA PCR SCREENING  GLUCOSE, CAPILLARY  TSH  MAGNESIUM  CBC  CBC  CBC  POC OCCULT BLOOD, ED  TYPE AND SCREEN  PREPARE FRESH FROZEN PLASMA  PREPARE RBC (CROSSMATCH)  PREPARE FRESH FROZEN PLASMA    EKG EKG Interpretation  Date/Time:  Thursday January 12 2020 20:08:47 EDT Ventricular Rate:  62 PR Interval:    QRS Duration: 90 QT Interval:  446 QTC Calculation: 453 R Axis:   -29 Text Interpretation: Sinus rhythm Borderline left axis deviation Low voltage, precordial leads Probable anteroseptal infarct, old No significant change since prior 12/17 Confirmed by Aletta Edouard [phone]) on 01/12/2020 8:15:48 PM   Radiology No results found.  Procedures .Critical Care Performed by: Hayden Rasmussen, MD Authorized by: Hayden Rasmussen, MD   Critical care provider statement:    Critical care time (minutes):  45   Critical care was necessary to treat or prevent imminent or life-threatening deterioration of the following conditions:  Circulatory failure   Critical care was time spent personally by me on the following activities:  Discussions with consultants, evaluation of patient's response to treatment, examination of patient, ordering and performing treatments and interventions, ordering  and review of laboratory studies, ordering and review of radiographic studies, pulse oximetry, re-evaluation of patient's condition, obtaining history from patient or surrogate, review of old charts and development of treatment plan with patient or surrogate   I assumed direction of critical care for this patient from another provider in my specialty: no     (including critical care time)  Medications Ordered in ED Medications  pantoprazole (PROTONIX) 80 mg in sodium chloride 0.9 % 100 mL (0.8 mg/mL) infusion (8 mg/hr Intravenous New Bag/Given 01/12/20 2257)  0.9 %  sodium chloride infusion  (Manually program via Guardrails IV Fluids) ( Intravenous MAR Hold 01/13/20 0816)  albuterol (PROVENTIL) (2.5 MG/3ML) 0.083% nebulizer solution 2.5 mg ( Inhalation MAR Hold 01/13/20 0816)  cinacalcet (SENSIPAR) tablet 60 mg ( Oral Automatically Held 01/20/20 0800)  sevelamer carbonate (RENVELA) tablet 1,600-2,400 mg ( Oral Automatically Held 01/21/20 1700)  acetaminophen (TYLENOL) tablet 650 mg ( Oral MAR Hold 01/13/20 0816)    Or  acetaminophen (TYLENOL) suppository 650 mg ( Rectal MAR Hold 01/13/20 0816)  ondansetron (ZOFRAN) injection 4 mg ( Intravenous MAR Hold 01/13/20 0816)  0.9 %  sodium chloride infusion (has no administration in time range)  0.9 %  sodium chloride infusion ( Intravenous Restarted 01/13/20 0907)  sodium chloride 0.9 % bolus 500 mL (0 mLs Intravenous Stopped 01/12/20 2137)  0.9 %  sodium chloride infusion (10 mL/hr Intravenous New Bag/Given 01/12/20 2234)  0.9 %  sodium chloride infusion (0 mL/hr Intravenous Stopped 01/12/20 2238)  ondansetron (ZOFRAN) injection 4 mg (4 mg Intravenous Given 01/12/20 2150)  pantoprazole (PROTONIX) 80 mg in sodium chloride 0.9 % 100 mL IVPB ( Intravenous Stopped 01/12/20 2256)    ED Course  I have reviewed the triage vital signs and the nursing notes.  Pertinent labs & imaging results that were available during my care of the patient were reviewed by me and considered in my medical decision making (see chart for details).  Clinical Course as of Jan 12 933  Thu Jan 12, 2020  2140 Discussed with Dr. Wynell Balloon, GI.  He is in agreement with current plan although recommends 2 units of FFP instead of 1.  He will be available if she needs emergent endoscopy tonight otherwise would rather do it with anesthesia tomorrow morning.   [MB]  2235 Discussed with Dr. Roel Cluck [facility] hospitalist will evaluate the patient for admission.   [MB]    Clinical Course User Index [MB] Hayden Rasmussen, MD   MDM Rules/Calculators/A&P                         This  patient complains of rectal bleeding; this involves an extensive number of treatment Options and is a complaint that carries with it a high risk of complications and Morbidity. The differential includes upper GI bleed, lower GI bleed, hypovolemia, shock, coagulopathy  I ordered, reviewed and interpreted labs, which included CBC with a trending down hemoglobin, INR slightly elevated 1.3, chemistries consistent with her end-stage renal disease, Covid testing negative I ordered medication IV fluids, FFP, packed red blood cells, IV PPI  Additional history obtained from patient's grandaughter Previous records obtained and reviewed in epic including prior endoscopy note I consulted GI Dr. Hilarie Fredrickson and [facility] hospitalist Dr. Roel Cluck and discussed lab and imaging findings  Critical Interventions: Resuscitation of patient with active GI bleed with blood products  After the interventions stated above, I reevaluated the patient and found blood pressure to be improved.  Remains awake and alert.  She understands she will need to be admitted to the hospital for likely another endoscopy either tonight or tomorrow depending on hemodynamic stability.   Final Clinical Impression(s) / ED Diagnoses Final diagnoses:  Acute GI bleeding    Rx / DC Orders ED Discharge Orders    None       Hayden Rasmussen, MD 01/13/20 [phone]

## 2020-01-12 NOTE — H&P (Signed)
Jane [name] JJO:[id number] DOB: Nov 23, 1945 DOA: 01/12/2020     PCP: [name], [name], MD   Outpatient Specialists:    CARDS:  Dr. Einar Gip  nephrology GI  Dr.Mansouraty.  ( [facility])    Patient arrived to ER on 01/12/20 at [place] Referred by Attending Hayden Rasmussen, MD   Patient coming from: home Lives alone,      Chief Complaint:   Chief Complaint  Patient presents with  . Rectal Bleeding  . Post-op Problem    HPI: Jane [name] is a 74 y.o. female with medical history significant of large duodenal mass paroxismal a.fib on Coumadin, ESRD on HD M,W F, orthostatic hypotension, OSA on CPAP external hemorrhoids but tolerating Coumadin, bilateral carotid bruits, hypertension  Presented with  Had EGD on Monday attempt at Duodenal TVA there after had cataract surgery Yesterday  she restarted coumadin and today started to have bloody/maroon BM  4-5 patient called her GI doctor who recommended arrival to emergency department.  Since coming to emergency department patient had at least one more bloody BM.   Her last HD was on Tuesday  HE last   Infectious risk factors:  Reports none   Has  been vaccinated against COVID in [place]   Initial COVID TEST  NEGATIVE   Lab Results  Component Value Date   [place] 01/12/2020   [place] NEGATIVE 01/05/2020    Regarding pertinent Chronic problems:     Hyperlipidemia - not on statins  Lipid Panel     Component Value Date/Time   CHOL 198 10/06/2019 1510   TRIG 51 10/06/2019 1510   HDL 53 10/06/2019 1510   CHOLHDL 3.7 10/06/2019 1510   VLDL 10 10/06/2019 1510   LDLCALC 135 (H) 10/06/2019 1510     HTN on Coreg   chronic CHF diastolic - last echo 1601  Diastolic CHF grade 1 EF 09-32%      OSA -on  CPAP,    A. Fib -  CHA2DS2/VAS Stroke Risk Points  Current as of 11 minutes ago     5     current  on anticoagulation with  Coumadin           -  Rate control:  Currently controlled with Coreg      ESRD on HD  MWF Estimated Creatinine Clearance: 5.2 mL/min (A) (by C-G formula based on SCr of 11.2 mg/dL (H)).  Lab Results  Component Value Date   CREATININE 11.20 (H) 01/12/2020   CREATININE 10.90 (H) 01/09/2020   CREATININE 11.10 (H) 01/09/2020     Chronic anemia - baseline hg Hemoglobin & Hematocrit  Recent Labs    01/09/20 1118 01/12/20 0805 01/12/20 1942  HGB 12.2 11.4* 9.0*     While in ER: Hg 9.0 INR 1.3  ER Provider Called:     Dr.Pyrtle  They Recommend admit to medicine ffp blood, and ptonix Will see in AM   Hospitalist was called for admission for GI bleed   The following Work up has been ordered so far:  Orders Placed This Encounter  Procedures  . SARS Coronavirus 2 by RT PCR (hospital order, performed in [facility] hospital lab) Nasopharyngeal Nasopharyngeal Swab  . Comprehensive metabolic panel  . CBC  . Protime-INR - (order if Patient is taking Coumadin / Warfarin)  . Diet NPO time specified  . Orthostatic Vital signs  . Place X2 Large Bore IV's  . Initiate Carrier Fluid Protocol  . Cardiac monitoring  . Complete patient signature  process for consent form  . Practitioner attestation of consent  . Informed Consent Details: Physician/Practitioner Attestation; Transcribe to consent form and obtain patient signature  . Consult to gastroenterology  ALL PATIENTS BEING ADMITTED/HAVING PROCEDURES NEED COVID-19 SCREENING  . Consult to hospitalist  ALL PATIENTS BEING ADMITTED/HAVING PROCEDURES NEED COVID-19 SCREENING  . Pulse oximetry, continuous  . POC occult blood, ED  . ED EKG  . EKG 12-Lead  . Type and screen [place]  . Prepare fresh frozen plasma  . Prepare RBC (crossmatch)  . Prepare fresh frozen plasma      Following Medications were ordered in ER: Medications  0.9 %  sodium chloride infusion (has no administration in time range)  pantoprazole (PROTONIX) 80 mg in sodium chloride 0.9 % 100 mL IVPB (has no administration in time range)   pantoprazole (PROTONIX) 80 mg in sodium chloride 0.9 % 100 mL (0.8 mg/mL) infusion (has no administration in time range)  sodium chloride 0.9 % bolus 500 mL (0 mLs Intravenous Stopped 01/12/20 2137)  0.9 %  sodium chloride infusion (10 mL/hr Intravenous New Bag/Given 01/12/20 2206)  ondansetron (ZOFRAN) injection 4 mg (4 mg Intravenous Given 01/12/20 2150)        Consult Orders  (From admission, onward)         Start     Ordered   01/12/20 2141  Consult to hospitalist  ALL PATIENTS BEING ADMITTED/HAVING PROCEDURES NEED COVID-19 SCREENING  Once       Comments: ALL PATIENTS BEING ADMITTED/HAVING PROCEDURES NEED COVID-19 SCREENING  Provider:  (Not yet assigned)  Question Answer Comment  Place call to: [facility]   Reason for Consult Admit      01/12/20 2140          Significant initial  Findings: Abnormal Labs Reviewed  COMPREHENSIVE METABOLIC PANEL - Abnormal; Notable for the following components:      Result Value   Glucose, Bld 105 (*)    BUN 41 (*)    Creatinine, Ser 11.20 (*)    Calcium 8.1 (*)    Total Protein 6.0 (*)    Albumin 3.0 (*)    GFR calc non Af Amer 3 (*)    GFR calc Af Amer 3 (*)    All other components within normal limits  CBC - Abnormal; Notable for the following components:   WBC 3.8 (*)    RBC 3.21 (*)    Hemoglobin 9.0 (*)    HCT 30.7 (*)    MCHC 29.3 (*)    RDW 16.2 (*)    All other components within normal limits  PROTIME-INR - Abnormal; Notable for the following components:   Prothrombin Time 15.4 (*)    INR 1.3 (*)    All other components within normal limits   Otherwise labs showing:    Recent Labs  Lab 01/09/20 1021 01/09/20 1118 01/12/20 1942  NA 140 142 138  K 4.9 4.1 4.8  CO2  --   --  23  GLUCOSE 95 92 105*  BUN 43* 39* 41*  CREATININE 11.10* 10.90* 11.20*  CALCIUM  --   --  8.1*    Cr   Lab Results  Component Value Date   CREATININE 11.20 (H) 01/12/2020   CREATININE 10.90 (H) 01/09/2020   CREATININE 11.10  (H) 01/09/2020    Recent Labs  Lab 01/12/20 1942  AST 15  ALT 12  ALKPHOS 90  BILITOT 0.6  PROT 6.0*  ALBUMIN 3.0*   Lab Results  Component Value Date   CALCIUM 8.1 (L) 01/12/2020   PHOS 3.3 07/18/2016      WBC      Component Value Date/Time   WBC 3.8 (L) 01/12/2020 1942   ANC    Component Value Date/Time   NEUTROABS 2.0 01/12/2020 0805   ALC No components found for: LYMPHAB   Plt: Lab Results  Component Value Date   PLT 169 01/12/2020     Lactic Acid, Venous    Component Value Date/Time   LATICACIDVEN 1.29 08/04/2016 0946     HG/HCT ,  Down  from baseline see below    Component Value Date/Time   HGB 9.0 (L) 01/12/2020 1942   HCT 30.7 (L) 01/12/2020 1942    No results for input(s): LIPASE, AMYLASE in the last 168 hours. No results for input(s): AMMONIA in the last 168 hours.   ECG: Ordered Personally reviewed by me showing: HR : 63 Rhythm:  NSR,    no evidence of ischemic changes QTC 453       UA  not ordered       ED Triage Vitals  Enc Vitals Group     BP 01/12/20 1855 91/68     Pulse Rate 01/12/20 1855 72     Resp 01/12/20 1855 18     Temp 01/12/20 1855 98.5 F (36.9 C)     Temp Source 01/12/20 1855 Oral     SpO2 01/12/20 1855 98 %     Weight 01/12/20 2001 232 lb (105.2 kg)     Height 01/12/20 2001 5\' 5"  (1.651 m)     Head Circumference --      Peak Flow --      Pain Score 01/12/20 1913 0     Pain Loc --      Pain Edu? --      Excl. in [place]? --   TMAX(24)@       Latest  Blood pressure (!) 104/38, pulse 63, temperature 98.3 F (36.8 C), temperature source Oral, resp. rate 12, height 5\' 5"  (1.651 m), weight 105.2 kg, SpO2 100 %.    Review of Systems:    Pertinent positives include: Maroon stools per rectum  Constitutional:  No weight loss, night sweats, Fevers, chills, fatigue, weight loss  HEENT:  No headaches, Difficulty swallowing,Tooth/dental problems,Sore throat,  No sneezing, itching, ear ache, nasal congestion, post  nasal drip,  Cardio-vascular:  No chest pain, Orthopnea, PND, anasarca, dizziness, palpitations.no Bilateral lower extremity swelling  GI:  No heartburn, indigestion, abdominal pain, nausea, vomiting, diarrhea, change in bowel habits, loss of appetite,   hematemesis Resp:  no shortness of breath at rest. No dyspnea on exertion, No excess mucus, no productive cough, No non-productive cough, No coughing up of blood.No change in color of mucus.No wheezing. Skin:  no rash or lesions. No jaundice GU:  no dysuria, change in color of urine, no urgency or frequency. No straining to urinate.  No flank pain.  Musculoskeletal:  No joint pain or no joint swelling. No decreased range of motion. No back pain.  Psych:  No change in mood or affect. No depression or anxiety. No memory loss.  Neuro: no localizing neurological complaints, no tingling, no weakness, no double vision, no gait abnormality, no slurred speech, no confusion  All systems reviewed and apart from [place] all are negative  Past Medical History:   Past Medical History:  Diagnosis Date  . Acid reflux   . Anemia of chronic disease   . Arthritis   .  Asthma   . Atrial fibrillation ([place])   . Bilateral carotid bruits   . Complication of anesthesia    "hard to wake up, I have sleep apnea" no CPAP  . Depression   . Diverticulitis   . Dysrhythmia    Afib  . ESRD (end stage renal disease) ([place])    MWF [place]  . Headache   . History of blood transfusion   . Hypertension   . Malaise and fatigue   . Orthostatic hypotension   . Shortness of breath    " when I walk to fast"  . Sleep apnea   . Syncope   . Tubulovillous adenoma of colon       Past Surgical History:  Procedure Laterality Date  . AV FISTULA PLACEMENT    . BACK SURGERY     Lumbar fusion L 4 and L 5  . BIOPSY  01/09/2020   Procedure: BIOPSY;  Surgeon: Rush Landmark Telford Nab., MD;  Location: [place];  Service: Gastroenterology;;  . COLONOSCOPY    .  ENDOSCOPIC MUCOSAL RESECTION N/A 01/09/2020   Procedure: ENDOSCOPIC MUCOSAL RESECTION;  Surgeon: Rush Landmark Telford Nab., MD;  Location: [facility];  Service: Gastroenterology;  Laterality: N/A;  . ESOPHAGOGASTRODUODENOSCOPY    . ESOPHAGOGASTRODUODENOSCOPY (EGD) WITH PROPOFOL N/A 01/09/2020   Procedure: ESOPHAGOGASTRODUODENOSCOPY (EGD) WITH PROPOFOL;  Surgeon: Rush Landmark Telford Nab., MD;  Location: [facility];  Service: Gastroenterology;  Laterality: N/A;  . EUS N/A 01/09/2020   Procedure: UPPER ENDOSCOPIC ULTRASOUND (EUS) RADIAL;  Surgeon: Rush Landmark Telford Nab., MD;  Location: [facility];  Service: Gastroenterology;  Laterality: N/A;  . HEMOSTASIS CLIP PLACEMENT  01/09/2020   Procedure: HEMOSTASIS CLIP PLACEMENT;  Surgeon: Irving Copas., MD;  Location: [facility];  Service: Gastroenterology;;  . Everlean Alstrom GENERIC HISTORICAL  07/09/2016   IR US GUIDE VASC ACCESS RIGHT 07/09/2016 Arne Cleveland, MD [facility]  . IR GENERIC HISTORICAL  07/09/2016   IR FLUORO GUIDE CV LINE RIGHT 07/09/2016 Arne Cleveland, MD [facility]  . KNEE ARTHROPLASTY Left   . LAPAROSCOPIC SIGMOID COLECTOMY N/A 07/11/2016   Procedure: LAPAROSCOPIC SIGMOID COLECTOMY;  Surgeon: Clovis Riley, MD;  Location: [facility];  Service: General;  Laterality: N/A;  . SCLEROTHERAPY  01/09/2020   Procedure: Clide Deutscher;  Surgeon: Mansouraty, Telford Nab., MD;  Location: [facility];  Service: Gastroenterology;;  . Lia Foyer LIFTING INJECTION  01/09/2020   Procedure: SUBMUCOSAL LIFTING INJECTION;  Surgeon: Irving Copas., MD;  Location: [facility];  Service: Gastroenterology;;  . TUBAL LIGATION      Social History:  Ambulatory  [place]      reports that she has never smoked. She has never used smokeless tobacco. She reports that she does not drink alcohol and does not use drugs.    Family History:   Family History  Problem Relation Age of Onset  . Heart failure Mother   . Stroke Mother   . Other  Father   . Colon cancer Neg Hx   . Liver disease Neg Hx   . Esophageal cancer Neg Hx   . Stomach cancer Neg Hx   . Inflammatory bowel disease Neg Hx   . Rectal cancer Neg Hx   . Pancreatic cancer Neg Hx     Allergies: Allergies  Allergen Reactions  . Latex Rash  . Penicillins     Yeast infection   . Sulfa Antibiotics Rash  . Tape Other (See Comments)    Plastic, silicone, and paper tape causes bruising and pulls off skin. Cloth tape  works fine     Prior to Admission medications   Medication Sig Start Date End Date Taking? Authorizing Provider  acetaminophen (TYLENOL) 500 MG tablet Take 1,500 mg by mouth daily as needed for headache.    [provider]  albuterol (PROVENTIL HFA;VENTOLIN HFA) 108 (90 Base) MCG/ACT inhaler Inhale 1 puff into the lungs every 6 (six) hours as needed for wheezing or shortness of breath. Patient taking differently: Inhale 2 puffs into the lungs every 6 (six) hours as needed for wheezing or shortness of breath.  01/26/18   [name], [name], MD  carvedilol (COREG) 25 MG tablet Take 25 mg by mouth 2 (two) times daily with a meal.    [provider]  cinacalcet (SENSIPAR) 60 MG tablet Take 60 mg by mouth every Monday, Wednesday, and Friday.  07/20/18   [provider]  ethyl chloride spray Apply 1 application topically daily as needed (port access).  12/25/17   [provider]  hydrALAZINE (APRESOLINE) 25 MG tablet Take 1 tablet (25 mg total) by mouth 3 (three) times daily. 12/22/19   Adrian Prows, MD  isosorbide dinitrate (ISORDIL) 10 MG tablet Take 3 tablets (30 mg total) by mouth 3 (three) times daily. Patient taking differently: Take 20 mg by mouth 3 (three) times daily.  12/22/19   Adrian Prows, MD  Methoxy PEG-Epoetin Beta (MIRCERA IJ) IV with dialysis 10/19/19 10/17/20  [provider]  multivitamin (RENA-VIT) TABS tablet Take 1 tablet by mouth at bedtime.     [provider]  nystatin cream (MYCOSTATIN) Apply 1  application topically 2 (two) times daily. Patient taking differently: Apply 1 application topically 2 (two) times daily as needed (rash).  08/12/19   [name], [name], MD  omeprazole (PRILOSEC) 40 MG capsule Take 1 capsule (40 mg total) by mouth 2 (two) times daily before a meal. 01/09/20   Mansouraty, Telford Nab., MD  polyethylene glycol (MIRALAX / GLYCOLAX) 17 g packet Take 17 g by mouth in the morning, at noon, and at bedtime.    [provider]  senna (SENOKOT) 8.6 MG tablet Take 3 tablets by mouth 2 (two) times daily.    [provider]  sevelamer carbonate (RENVELA) 800 MG tablet Take 1,600-2,400 mg by mouth See admin instructions. Take Take 1600 to 2400 mg with each meal    [provider]  triamcinolone cream (KENALOG) 0.1 % Apply 1 application topically 2 (two) times daily. 08/12/19   [name], [name], MD  warfarin (COUMADIN) 5 MG tablet TAKE 1 & 1/2 (ONE & ONE-HALF) TABLETS BY MOUTH EVERY TUESDAY AND EVERY THURSDAY AND 1 TABLET ONCE DAILY ALL OTHER DAYS Patient taking differently: Take 5-7.5 mg by mouth See admin instructions. Take 7.5 mg at night on Mon, Wed, and Fri. Take 5 mg at night on Sun, Tue, Thurs, and Sat 09/23/19   [name], [name], MD   Physical Exam: Vitals with BMI 01/12/2020 01/12/2020 01/12/2020  Height - - -  Weight - - -  BMI - - -  Systolic - 299 371  Diastolic - 38 44  Pulse 63 - 62     1. General:  in No     Chronically ill   -appearing 2. Psychological: Alert and  Oriented 3. Head/ENT:    Dry Mucous Membranes                          Head Non traumatic, neck supple  Poor Dentition 4. SKIN: decreased Skin turgor,  Skin clean Dry and intact no rash 5. Heart: Regular rate and rhythm no  Murmur, no Rub or gallop 6. Lungs:  Clear to auscultation bilaterally, no wheezes or crackles   7. Abdomen: Soft,  non-tender, Non distended   obese   bowel sounds present 8. Lower extremities: no clubbing, cyanosis, no  edema 9.  Neurologically Grossly intact, moving all 4 extremities equally  10. MSK: Normal range of motion   All other LABS:     Recent Labs  Lab 01/09/20 1021 01/09/20 1118 01/12/20 0805 01/12/20 1942  WBC  --   --  4.3 3.8*  NEUTROABS  --   --  2.0  --   HGB 12.6 12.2 11.4* 9.0*  HCT 37.0 36.0 34.5* 30.7*  MCV  --   --  88.9 95.6  PLT  --   --  169.0 169     Recent Labs  Lab 01/09/20 1021 01/09/20 1118 01/12/20 1942  NA 140 142 138  K 4.9 4.1 4.8  CL 101 99 102  CO2  --   --  23  GLUCOSE 95 92 105*  BUN 43* 39* 41*  CREATININE 11.10* 10.90* 11.20*  CALCIUM  --   --  8.1*     Recent Labs  Lab 01/12/20 1942  AST 15  ALT 12  ALKPHOS 90  BILITOT 0.6  PROT 6.0*  ALBUMIN 3.0*       Cultures:    Component Value Date/Time   SDES URINE, CLEAN CATCH 08/04/2016 0932   SPECREQUEST NONE 08/04/2016 0932   CULT (A) 08/04/2016 0932    >=100,000 COLONIES/mL LACTOBACILLUS SPECIES Standardized susceptibility testing for this organism is not available.    REPTSTATUS 08/05/2016 FINAL 08/04/2016 0932     Radiological Exams on Admission: No results found.  Chart has been reviewed    Assessment/Plan  74 y.o. female with medical history significant of large duodenal mass paroxismal a.fib on Coumadin, ESRD on HD, orthostatic hypotension, OSA on CPAP external hemorrhoids but tolerating Coumadin, bilateral carotid bruits, hypertension   Admitted for upper GI bleed in the setting of recent endoscopy and biopsy on anticoagulation currently  Present on Admission: . Upper GI bleed -in the setting of recent biopsy.  Will hold Coumadin INR 1.3.  ER provider discussed with GI they will aware of plan for EGD in a.m. with anesthesiology. Advised blood transfusion given symptomatic anemia and brisk bleed. Also advised Coumadin reversal with FFP Continue to monitor in stepdown serial CBC And Protonix IV Continue n.p.o. post midnight . Paroxysmal atrial fibrillation (HCC) -hold  Coumadin given soft pressures hold Coreg for now as well  . Hypertension with renal disease -soft blood pressures currently we will hold home medications      . Intestinal metaplasia of gastric mucosa -as per GI appreciate their input.  End-stage renal disease on hemodialysis will need to notify nephrology if the patient is being admitted    Other plan as per orders.  DVT prophylaxis:  SCD   Code Status:    Code Status: Prior FULL CODE   as per patient   I had personally discussed CODE STATUS with patient      Family Communication:   Family not at  Bedside   Disposition Plan:      To home once workup is complete and patient is stable   Following barriers for discharge:  Hemoglobin stable                                  Will need consultants to evaluate patient prior to discharge                        Consults called:  Dr. Collene Mares with GI is aware. Notified nephrology through epic  Admission status:  ED Disposition    ED Disposition Condition [place]: [place] [[id number]]  Level of Care: Progressive [102]  Admit to Progressive based on following criteria: GI, ENDOCRINE disease patients with GI bleeding, acute liver failure or pancreatitis, stable with diabetic ketoacidosis or thyrotoxicosis (hypothyroid) state.  May admit patient to [facility] or [facility] if equivalent level of care is available:: No  Covid Evaluation: Asymptomatic Screening Protocol (No Symptoms)  Admission Type: Emergency [1]  Diagnosis: Lower GI bleed [[id number]]  Admitting Physician: Toy Baker [[id number]]  Attending Physician: Toy Baker [[id number]]  Estimated length of stay: past midnight tomorrow  Certification:: I certify this patient will need inpatient services for at least 2 midnights          inpatient     I Expect 2 midnight stay secondary to severity of patient's current illness  need for inpatient interventions justified by the following:  hemodynamic instability despite optimal treatment ( hypotension )   Severe lab/radiological/exam abnormalities including:   Symptomatic anemia and extensive comorbidities including:       CHF    CKD .   Marland Kitchen Chronic anticoagulation  That are currently affecting medical management.   I expect  patient to be hospitalized for 2 midnights requiring inpatient medical care.  Patient is at high risk for adverse outcome (such as loss of life or disability) if not treated.  Indication for inpatient stay as follows:    Hemodynamic instability despite maximal medical therapy,    inability to maintain oral hydration    Need for operative/procedural  intervention    Need for  IV fluids, IV PPI blood transfusion   Level of care   progressive tele indefinitely please discontinue once patient no longer qualifies COVID-19 Labs    Lab Results  Component Value Date   [place] 01/12/2020     Precautions: admitted as  Covid Negative     PPE: Used by the provider:   N95  eye Goggles,  Gloves    Jane [name] 01/13/2020, 2:23 AM    [facility]     after 2 AM please page floor coverage PA If 7AM-7PM, please contact the day team taking care of the patient using [email or web address]   Patient was evaluated in the context of the global COVID-19 pandemic, which necessitated consideration that the patient might be at risk for infection with the SARS-CoV-2 virus that causes COVID-19. Institutional protocols and algorithms that pertain to the evaluation of patients at risk for COVID-19 are in a state of rapid change based on information released by regulatory bodies including the CDC and federal and state organizations. These policies and algorithms were followed during the patient's care.

## 2020-01-12 NOTE — ED Notes (Signed)
IV access attempted x 3 with no success, pt is HD pt with right arm restricted. Provider and IV team notified.

## 2020-01-13 ENCOUNTER — Other Ambulatory Visit: Payer: Self-pay

## 2020-01-13 ENCOUNTER — Encounter (HOSPITAL_COMMUNITY): Payer: Self-pay | Admitting: Internal Medicine

## 2020-01-13 ENCOUNTER — Encounter: Payer: Self-pay | Admitting: Gastroenterology

## 2020-01-13 ENCOUNTER — Inpatient Hospital Stay (HOSPITAL_COMMUNITY): Payer: Medicare Other | Admitting: Anesthesiology

## 2020-01-13 ENCOUNTER — Encounter (HOSPITAL_COMMUNITY)
Admission: EM | Disposition: A | Discharge status: Home / Self Care | Payer: Self-pay | Source: Home / Self Care | Attending: Internal Medicine

## 2020-01-13 DIAGNOSIS — K264 Chronic or unspecified duodenal ulcer with hemorrhage: Secondary | ICD-10-CM

## 2020-01-13 DIAGNOSIS — K922 Gastrointestinal hemorrhage, unspecified: Secondary | ICD-10-CM

## 2020-01-13 DIAGNOSIS — D62 Acute posthemorrhagic anemia: Secondary | ICD-10-CM

## 2020-01-13 DIAGNOSIS — K921 Melena: Secondary | ICD-10-CM

## 2020-01-13 DIAGNOSIS — Z6841 Body Mass Index (BMI) 40.0 and over, adult: Secondary | ICD-10-CM

## 2020-01-13 HISTORY — PX: ESOPHAGOGASTRODUODENOSCOPY (EGD) WITH PROPOFOL: SHX5813

## 2020-01-13 HISTORY — PX: HEMOSTASIS CONTROL: SHX6838

## 2020-01-13 HISTORY — PX: HOT HEMOSTASIS: SHX5433

## 2020-01-13 HISTORY — PX: SCLEROTHERAPY: SHX6841

## 2020-01-13 LAB — CBC
HCT: 26.1 % — ABNORMAL LOW (ref 36.0–46.0)
HCT: 26.9 % — ABNORMAL LOW (ref 36.0–46.0)
Hemoglobin: 8.2 g/dL — ABNORMAL LOW (ref 12.0–15.0)
Hemoglobin: 8.4 g/dL — ABNORMAL LOW (ref 12.0–15.0)
MCH: 29.1 pg (ref 26.0–34.0)
MCH: 28.3 pg (ref 26.0–34.0)
MCHC: 31.4 g/dL (ref 30.0–36.0)
MCHC: 31.2 g/dL (ref 30.0–36.0)
MCV: 92.6 fL (ref 80.0–100.0)
MCV: 90.6 fL (ref 80.0–100.0)
Platelets: 175 10*3/uL (ref 150–400)
Platelets: 139 10*3/uL — ABNORMAL LOW (ref 150–400)
RBC: 2.82 MIL/uL — ABNORMAL LOW (ref 3.87–5.11)
RBC: 2.97 MIL/uL — ABNORMAL LOW (ref 3.87–5.11)
RDW: 16.3 % — ABNORMAL HIGH (ref 11.5–15.5)
RDW: 16 % — ABNORMAL HIGH (ref 11.5–15.5)
WBC: 8.7 10*3/uL (ref 4.0–10.5)
WBC: 5.7 10*3/uL (ref 4.0–10.5)
nRBC: 0 % (ref 0.0–0.2)
nRBC: 0 % (ref 0.0–0.2)

## 2020-01-13 LAB — PREPARE FRESH FROZEN PLASMA: Unit division: 0

## 2020-01-13 LAB — BPAM FFP
Blood Product Expiration Date: 202106292359
ISSUE DATE / TIME: 202106242151
Unit Type and Rh: 5100

## 2020-01-13 LAB — COMPREHENSIVE METABOLIC PANEL
ALT: 12 U/L (ref 0–44)
AST: 14 U/L — ABNORMAL LOW (ref 15–41)
Albumin: 2.7 g/dL — ABNORMAL LOW (ref 3.5–5.0)
Alkaline Phosphatase: 83 U/L (ref 38–126)
Anion gap: 11 (ref 5–15)
BUN: 52 mg/dL — ABNORMAL HIGH (ref 8–23)
CO2: 25 mmol/L (ref 22–32)
Calcium: 7.8 mg/dL — ABNORMAL LOW (ref 8.9–10.3)
Chloride: 103 mmol/L (ref 98–111)
Creatinine, Ser: 12.04 mg/dL — ABNORMAL HIGH (ref 0.44–1.00)
GFR calc Af Amer: 3 mL/min — ABNORMAL LOW (ref >60)
GFR calc non Af Amer: 3 mL/min — ABNORMAL LOW (ref >60)
Glucose, Bld: 92 mg/dL (ref 70–99)
Potassium: 4.8 mmol/L (ref 3.5–5.1)
Sodium: 139 mmol/L (ref 135–145)
Total Bilirubin: 0.7 mg/dL (ref 0.3–1.2)
Total Protein: 5.4 g/dL — ABNORMAL LOW (ref 6.5–8.1)

## 2020-01-13 LAB — PROTIME-INR
INR: 1.3 — ABNORMAL HIGH (ref 0.8–1.2)
Prothrombin Time: 15.3 seconds — ABNORMAL HIGH (ref 11.4–15.2)

## 2020-01-13 LAB — TSH: TSH: 0.351 u[IU]/mL (ref 0.350–4.500)

## 2020-01-13 LAB — MRSA PCR SCREENING: MRSA by PCR: NEGATIVE

## 2020-01-13 LAB — BRAIN NATRIURETIC PEPTIDE: B Natriuretic Peptide: 122.4 pg/mL — ABNORMAL HIGH (ref 0.0–100.0)

## 2020-01-13 LAB — MAGNESIUM: Magnesium: 2 mg/dL (ref 1.7–2.4)

## 2020-01-13 LAB — GLUCOSE, CAPILLARY: Glucose-Capillary: 82 mg/dL (ref 70–99)

## 2020-01-13 SURGERY — ESOPHAGOGASTRODUODENOSCOPY (EGD) WITH PROPOFOL
Anesthesia: General

## 2020-01-13 MED ORDER — ACETAMINOPHEN 650 MG RE SUPP: 650.0000 mg | Freq: Four times a day (QID) | RECTAL | Status: DC | PRN

## 2020-01-13 MED ORDER — DARBEPOETIN ALFA 40 MCG/0.4ML IJ SOSY
PREFILLED_SYRINGE | INTRAMUSCULAR | Status: AC
  Administered 2020-01-13: 40 ug via INTRAVENOUS
  Filled 2020-01-13: qty 0.4

## 2020-01-13 MED ORDER — DARBEPOETIN ALFA 40 MCG/0.4ML IJ SOSY: 40.0000 ug | PREFILLED_SYRINGE | INTRAMUSCULAR | Status: DC

## 2020-01-13 MED ORDER — PROPOFOL 10 MG/ML IV BOLUS
INTRAVENOUS | Status: DC | PRN
  Administered 2020-01-13: 110 mg via INTRAVENOUS
  Administered 2020-01-13: 40 mg via INTRAVENOUS

## 2020-01-13 MED ORDER — SODIUM CHLORIDE 0.9 % IV SOLN
INTRAVENOUS | Status: AC | PRN
  Administered 2020-01-13: 500 mL via INTRAVENOUS

## 2020-01-13 MED ORDER — SODIUM CHLORIDE (PF) 0.9 % IJ SOLN
PREFILLED_SYRINGE | INTRAMUSCULAR | Status: DC | PRN
  Administered 2020-01-13: 4 mL

## 2020-01-13 MED ORDER — SODIUM CHLORIDE 0.9 % IV SOLN: INTRAVENOUS | Status: DC

## 2020-01-13 MED ORDER — HYDRALAZINE HCL 25 MG PO TABS
25.0000 mg | ORAL_TABLET | Freq: Three times a day (TID) | ORAL | Status: DC
  Administered 2020-01-13: 25 mg via ORAL
  Filled 2020-01-13: qty 1

## 2020-01-13 MED ORDER — SODIUM CHLORIDE 0.9 % IV SOLN: 250.0000 mL | INTRAVENOUS | Status: DC | PRN

## 2020-01-13 MED ORDER — ONDANSETRON HCL 4 MG PO TABS: 4.0000 mg | ORAL_TABLET | Freq: Four times a day (QID) | ORAL | Status: DC | PRN

## 2020-01-13 MED ORDER — FENTANYL CITRATE (PF) 100 MCG/2ML IJ SOLN
INTRAMUSCULAR | Status: AC
  Filled 2020-01-13: qty 2

## 2020-01-13 MED ORDER — ACETAMINOPHEN 325 MG PO TABS: 650.0000 mg | ORAL_TABLET | Freq: Four times a day (QID) | ORAL | Status: DC | PRN

## 2020-01-13 MED ORDER — ALBUTEROL SULFATE (2.5 MG/3ML) 0.083% IN NEBU: 2.5000 mg | INHALATION_SOLUTION | Freq: Four times a day (QID) | RESPIRATORY_TRACT | Status: DC | PRN

## 2020-01-13 MED ORDER — SEVELAMER CARBONATE 800 MG PO TABS
1600.0000 mg | ORAL_TABLET | Freq: Three times a day (TID) | ORAL | Status: DC
  Administered 2020-01-15 – 2020-01-19 (×10): 1600 mg via ORAL
  Filled 2020-01-13 (×7): qty 2
  Filled 2020-01-13: qty 3
  Filled 2020-01-13 (×4): qty 2

## 2020-01-13 MED ORDER — CARVEDILOL 25 MG PO TABS: 25.0000 mg | ORAL_TABLET | Freq: Two times a day (BID) | ORAL | Status: DC

## 2020-01-13 MED ORDER — SUGAMMADEX SODIUM 200 MG/2ML IV SOLN
INTRAVENOUS | Status: DC | PRN
  Administered 2020-01-13: 240 mg via INTRAVENOUS

## 2020-01-13 MED ORDER — HYDROCODONE-ACETAMINOPHEN 5-325 MG PO TABS: 1.0000 | ORAL_TABLET | ORAL | Status: DC | PRN

## 2020-01-13 MED ORDER — SODIUM CHLORIDE 0.9% FLUSH: 3.0000 mL | Freq: Two times a day (BID) | INTRAVENOUS | Status: DC

## 2020-01-13 MED ORDER — ONDANSETRON HCL 4 MG/2ML IJ SOLN
INTRAMUSCULAR | Status: DC | PRN
  Administered 2020-01-13: 4 mg via INTRAVENOUS

## 2020-01-13 MED ORDER — ISOSORBIDE DINITRATE 20 MG PO TABS
20.0000 mg | ORAL_TABLET | Freq: Three times a day (TID) | ORAL | Status: DC
  Administered 2020-01-13: 20 mg via ORAL
  Filled 2020-01-13 (×4): qty 1

## 2020-01-13 MED ORDER — CHLORHEXIDINE GLUCONATE CLOTH 2 % EX PADS
6.0000 | MEDICATED_PAD | Freq: Every day | CUTANEOUS | Status: DC
  Administered 2020-01-13 – 2020-01-19 (×5): 6 via TOPICAL

## 2020-01-13 MED ORDER — DOXERCALCIFEROL 4 MCG/2ML IV SOLN
3.0000 ug | INTRAVENOUS | Status: DC
  Administered 2020-01-16 – 2020-01-18 (×2): 3 ug via INTRAVENOUS

## 2020-01-13 MED ORDER — HYDRALAZINE HCL 20 MG/ML IJ SOLN: 10.0000 mg | Freq: Four times a day (QID) | INTRAMUSCULAR | Status: DC | PRN

## 2020-01-13 MED ORDER — CINACALCET HCL 30 MG PO TABS
60.0000 mg | ORAL_TABLET | ORAL | Status: DC
  Administered 2020-01-16: 60 mg via ORAL
  Filled 2020-01-13: qty 2

## 2020-01-13 MED ORDER — FENTANYL CITRATE (PF) 250 MCG/5ML IJ SOLN
INTRAMUSCULAR | Status: DC | PRN
  Administered 2020-01-13 (×2): 50 ug via INTRAVENOUS

## 2020-01-13 MED ORDER — DOXERCALCIFEROL 4 MCG/2ML IV SOLN: 3.0000 ug | INTRAVENOUS | Status: DC

## 2020-01-13 MED ORDER — SODIUM CHLORIDE 0.9% FLUSH: 3.0000 mL | INTRAVENOUS | Status: DC | PRN

## 2020-01-13 MED ORDER — ONDANSETRON HCL 4 MG/2ML IJ SOLN
4.0000 mg | Freq: Four times a day (QID) | INTRAMUSCULAR | Status: DC | PRN
  Administered 2020-01-14: 4 mg via INTRAVENOUS
  Filled 2020-01-13: qty 2

## 2020-01-13 MED ORDER — EPINEPHRINE 1 MG/10ML IJ SOSY
PREFILLED_SYRINGE | INTRAMUSCULAR | Status: AC
  Filled 2020-01-13: qty 10

## 2020-01-13 MED ORDER — DOXERCALCIFEROL 4 MCG/2ML IV SOLN
INTRAVENOUS | Status: AC
  Administered 2020-01-13: 3 ug via INTRAVENOUS
  Filled 2020-01-13: qty 2

## 2020-01-13 MED ORDER — EPHEDRINE SULFATE 50 MG/ML IJ SOLN
INTRAMUSCULAR | Status: DC | PRN
Start: 2020-01-13 — End: 2020-01-13
  Administered 2020-01-13 (×2): 5 mg via INTRAVENOUS

## 2020-01-13 MED ORDER — ROCURONIUM BROMIDE 10 MG/ML (PF) SYRINGE
PREFILLED_SYRINGE | INTRAVENOUS | Status: DC | PRN
  Administered 2020-01-13: 50 mg via INTRAVENOUS

## 2020-01-13 MED ORDER — LIDOCAINE 2% (20 MG/ML) 5 ML SYRINGE
INTRAMUSCULAR | Status: DC | PRN
  Administered 2020-01-13: 60 mg via INTRAVENOUS

## 2020-01-13 SURGICAL SUPPLY — 14 items

## 2020-01-13 NOTE — Op Note (Signed)
[facility] Patient Name: Jane [name] Procedure Date : 01/13/2020 MRN: [id number] Attending MD: Docia Chuck. Henrene Pastor , MD Date of Birth: 09-14-1945 CSN: [id number] Age: 74 Admit Type: Inpatient Procedure:                Upper GI endoscopy with control of bleeding                            (injection, gold probe, hemospray) Indications:              Melena inpatient having undergone EMR for duodenal                            tumor several days ago. Pathology showed                            tubulovillous adenoma Providers:                Docia Chuck. Henrene Pastor, MD, William Dalton, Technician,                            Elmer Ramp. Tilden Dome, RN Referring MD:             [facility] hospitalist Medicines:                Monitored Anesthesia Care Complications:            No immediate complications. Estimated Blood Loss:     Estimated blood loss: none. Procedure:                Pre-Anesthesia Assessment:                           - Prior to the procedure, a History and Physical                            was performed, and patient medications and                            allergies were reviewed. The patient's tolerance of                            previous anesthesia was also reviewed. The risks                            and benefits of the procedure and the sedation                            options and risks were discussed with the patient.                            All questions were answered, and informed consent                            was obtained. Prior Anticoagulants: The patient has  taken Coumadin (warfarin), last dose was 1 day                            prior to procedure. ASA Grade Assessment: III - A                            patient with severe systemic disease. After                            reviewing the risks and benefits, the patient was                            deemed in satisfactory condition to undergo the                             procedure.                           After obtaining informed consent, the endoscope was                            passed under direct vision. Throughout the                            procedure, the patient's blood pressure, pulse, and                            oxygen saturations were monitored continuously. The                            GIF-H190 ([id number]) Olympus gastroscope was                            introduced through the mouth, and advanced to the                            second part of duodenum. The upper GI endoscopy was                            accomplished without difficulty. The patient                            tolerated the procedure well. Scope In: Scope Out: Findings:      The esophagus was normal.      The stomach revealed 2 clips and an adjacent post polypectomy ulcer       without bleeding. Otherwise unremarkable.      The cardia and gastric fundus were normal on retroflexion, a small       hiatal hernia.      The duodenal bulb was unremarkable. The post bulbar duodenum was filled       with blood associated with active bleeding. The area was vigorously       irrigated and suctioned. In time, multiple clips and clots from prior       EMR defect identified. Additional irrigation and suctioning  performed.       In time, pulsating oozing was identified at the base of several clips.       This area was injected with 1-10,000 epinephrine several times. Though       this resulted in significant slowing of bleeding, there was still       ongoing pulsatile oozing in the area of concern. Thus, this region was       coagulated with gold probe. In time, complete hemostasis was achieved.       The area was observed for evidence of recurrent bleeding. There was       none. Subsequently, the area was treated with large volume hemospray. Impression:               1. Massive bleeding from post bulbar duodenum at                            prior EMR resection site as  described above. Status                            post successful endoscopic hemostatic therapy.                            Please see description above as well as images.                           2. Small ulcer and hemoclips in stomach at                            polypectomy site (hyperplastic polyp). Recommendation:           1. Observe closely for recurrent bleeding (vital                            signs, stools, serial hemoglobin)                           2. Recommend interventional radiology consultation                            (proactive) should the patient rebleed. This would                            be the next step to achieve hemostasis for                            recurrent bleeding                           3. Clear liquid diet                           4. Holding Coumadin                           5. Inpatient GI team aware of findings and will to  continue to follow closely                           I have evaluated the patient in the postoperative                            recovery area. She looks well without complaints.                            Procedure highlights reviewed. Procedure Code(s):        --- Professional ---                           [phone], Esophagogastroduodenoscopy, flexible,                            transoral; with control of bleeding, any method Diagnosis Code(s):        --- Professional ---                           K26.9, Duodenal ulcer, unspecified as acute or                            chronic, without hemorrhage or perforation                           K92.1, Melena (includes Hematochezia) CPT copyright 2019 [employer]. All rights reserved. The codes documented in this report are preliminary and upon coder review may  be revised to meet current compliance requirements. Docia Chuck. Henrene Pastor, MD 01/13/2020 11:10:52 AM This report has been signed electronically. Number of Addenda: 0

## 2020-01-13 NOTE — Telephone Encounter (Signed)
Thank you for update. I am sorry to hear about this. If she is still dropping her counts into tomorrow, I believe EGD will be helpful to clarify if this is from the gastric lesion or more likely the duodenal lesion. Pediatric colonoscope will be necessary for evaluation.  Duodenoscope will be helpful as well, but will need to be placed in even more forward/long-position for evaluation of the region. Not sure how much FFP is going to keep adding to the situation, since its effects usually are able to get an INR to <1.8, but dialysis and PLT could help in the setting of her PLT dysfunction due to her ESRD. Please keep me UTD. Thank you. GM

## 2020-01-13 NOTE — Plan of Care (Addendum)
Patient received to room 37 via stretcher from ED. No bed in room, [facility] made aware of it by charge RN Cherlyn Cushing and ok for patient to stay on stretcher.  0420 Bed received and patient moved onto bed without difficulty. Assessment completed. Tele showing NSR , 02 sat 97% on RA. Messaged MD to clarify Blood transfusion order.   Call back received from Dr. Marcello Moores and want to check H &H now and see Results first. Will decide after Results if further blood transfusions needed. Central Tele called and noted that patient 02 sat going into low 80's and upper 80's. Patient sleeping with 02 sat 84% on RA. Patient awakened and sats up to 97%. Patient has history of sleep apnea. 02 at 2L/Miner placed on patient. Made aware of event and patient verbalizes understanding.

## 2020-01-13 NOTE — Anesthesia Preprocedure Evaluation (Addendum)
Anesthesia Evaluation  Patient identified by MRN, date of birth, ID band Patient awake    Reviewed: Allergy & Precautions, NPO status , Patient's Chart, lab work & pertinent test results, reviewed documented beta blocker date and time   History of Anesthesia Complications (+) history of anesthetic complications  Airway Mallampati: II  TM Distance: >3 FB Neck ROM: Full    Dental no notable dental hx. (+) Teeth Intact, Caps   Pulmonary shortness of breath and with exertion, asthma , sleep apnea and Continuous Positive Airway Pressure Ventilation ,    Pulmonary exam normal breath sounds clear to auscultation       Cardiovascular hypertension, Pt. on medications and Pt. on home beta blockers Normal cardiovascular exam+ dysrhythmias Atrial Fibrillation  Rhythm:Regular Rate:Normal  Hx/o A. Fib currently in NSR  Fistula right arm   Neuro/Psych  Headaches, PSYCHIATRIC DISORDERS Depression Cataract surgery OD 6/23    GI/Hepatic Neg liver ROS, GERD  Medicated and Controlled,GI bleed   Endo/Other  Morbid obesity  Renal/GU ESRF and DialysisRenal diseaseLast dialysis 6/22 labs pending this am  negative genitourinary   Musculoskeletal  (+) Arthritis , Osteoarthritis,    Abdominal (+) + obese,   Peds  Hematology  (+) anemia , Coumadin therapy- last dose 6/24 PT- 15.3 INR 1.3   Anesthesia Other Findings   Reproductive/Obstetrics                            Anesthesia Physical Anesthesia Plan  ASA: IV  Anesthesia Plan: General   Post-op Pain Management:    Induction: Intravenous  PONV Risk Score and Plan: 4 or greater and Ondansetron and Treatment may vary due to age or medical condition  Airway Management Planned: Oral ETT  Additional Equipment:   Intra-op Plan:   Post-operative Plan: Extubation in OR  Informed Consent: I have reviewed the patients History and Physical, chart, labs and  discussed the procedure including the risks, benefits and alternatives for the proposed anesthesia with the patient or authorized representative who has indicated his/her understanding and acceptance.     Dental advisory given  Plan Discussed with: CRNA  Anesthesia Plan Comments:         Anesthesia Quick Evaluation

## 2020-01-13 NOTE — Procedures (Signed)
Patient was seen on dialysis and the procedure was supervised.  BFR 350  Via AVF BP is  151/47.   Patient appears to be tolerating treatment well  Louis Meckel 01/13/2020

## 2020-01-13 NOTE — Anesthesia Postprocedure Evaluation (Signed)
Anesthesia Post Note  Patient: Jane [name]  Procedure(s) Performed: ESOPHAGOGASTRODUODENOSCOPY (EGD) WITH PROPOFOL (N/A ) HOT HEMOSTASIS (ARGON PLASMA COAGULATION/BICAP) (N/A ) SCLEROTHERAPY HEMOSTASIS CONTROL     Patient location during evaluation: PACU Anesthesia Type: General Level of consciousness: awake and alert and oriented Pain management: pain level controlled Vital Signs Assessment: post-procedure vital signs reviewed and stable Respiratory status: spontaneous breathing, nonlabored ventilation and respiratory function stable Cardiovascular status: blood pressure returned to baseline and stable Postop Assessment: no apparent nausea or vomiting Anesthetic complications: no   No complications documented.             Katalena Malveaux A.

## 2020-01-13 NOTE — Consult Note (Addendum)
Consultation  Referring Provider: Dr. Candiss Norse    Primary Care Physician:  [name], Jane G, MD Primary Gastroenterologist: Dr. Hilarie Fredrickson    Reason for Consultation: GI bleed              HPI:   Jane [name] is a 74 y.o. female with a past medical history as listed below including A. fib on Coumadin, ESRD on HD MWF, OSA on CPAP, hypertension and others, who presented to the ER on 01/12/2020 with a complaint of GI bleed.    Recent GI history includes EGD on 01/09/2020 with removal of duodenal polyp (see below), also had cataract surgery on 01/11/2020, she had restarted Coumadin and yesterday reported a bloody/maroon bowel movement 4-5 times.  She called our outpatient service who told her to come to the ER.    Today, the patient explains that she was doing fairly well at home and had not yet taken her Coumadin per instruction after her EGD and started with maroon/dark-colored stools yesterday evening, she had about 4 at home and 5 since she has been here, she called our on call service and was told to come to the ER.  Explains that she took her Coumadin right before leaving because "I knew I would not get it for a while and I was supposed to start it last night".  She had not taken it prior to this.  Tells me that she has not had much of an appetite over the past few days but has had a lot of procedures including cataract surgery as above.  Does have some generalized abdominal discomfort and "rumbling" in her abdomen but denies any nausea or vomiting.    Denies fever, chills, weight loss or symptoms that awaken her from sleep.     ER course: Hemoglobin 9, INR 1.3-Coumadin held, FFP given  GI history: 01/09/2020 EGD/EUS Dr. Rush Landmark - No gross lesions in esophagus. Z-line regular, 39 cm from the incisors. - Gastritis with hemorrhage. Biopsied. - One gastric polyp. Resected and retrieved. Clips (MR conditional) were placed. - Normal major papilla and minor papilla. - A single large duodenal polyp.  Resected via mucosal resection in piecemeal fashion andretrieved. Clips (MR conditional) were placed to attempt closure of the defect but could not close the entirety of the defect. - Otherwise, no gross lesions in the entire examined duodenum. - Normal examined proximal jejunum. EUS Impression: - A lesion was found in the second portion of the duodenum. Tissue has not been obtained. However, the clinical appearance is consistent with an adenoma (previously biopsied as TVA). Recommendations: Increase omeprazole to 40 mg twice daily for a month, then 40 mg daily thereafter, restart Coumadin 6/20 4 PM, repeat EGD in 3 to 4 months  Past Medical History:  Diagnosis Date   Acid reflux    Anemia of chronic disease    Arthritis    Asthma    Atrial fibrillation (HCC)    Bilateral carotid bruits    Complication of anesthesia    "hard to wake up, I have sleep apnea" no CPAP   Depression    Diverticulitis    Dysrhythmia    Afib   ESRD (end stage renal disease) ([place])    MWF [place]   Headache    History of blood transfusion    Hypertension    Malaise and fatigue    Orthostatic hypotension    Shortness of breath    " when I walk to fast"   Sleep apnea  Syncope    Tubulovillous adenoma of colon     Past Surgical History:  Procedure Laterality Date   AV FISTULA PLACEMENT     BACK SURGERY     Lumbar fusion L 4 and L 5   BIOPSY  01/09/2020   Procedure: BIOPSY;  Surgeon: Rush Landmark Telford Nab., MD;  Location: [place];  Service: Gastroenterology;;   COLONOSCOPY     ENDOSCOPIC MUCOSAL RESECTION N/A 01/09/2020   Procedure: ENDOSCOPIC MUCOSAL RESECTION;  Surgeon: Irving Copas., MD;  Location: [place];  Service: Gastroenterology;  Laterality: N/A;   ESOPHAGOGASTRODUODENOSCOPY     ESOPHAGOGASTRODUODENOSCOPY (EGD) WITH PROPOFOL N/A 01/09/2020   Procedure: ESOPHAGOGASTRODUODENOSCOPY (EGD) WITH PROPOFOL;  Surgeon: Rush Landmark Telford Nab.,  MD;  Location: [place];  Service: Gastroenterology;  Laterality: N/A;   EUS N/A 01/09/2020   Procedure: UPPER ENDOSCOPIC ULTRASOUND (EUS) RADIAL;  Surgeon: Irving Copas., MD;  Location: [place];  Service: Gastroenterology;  Laterality: N/A;   HEMOSTASIS CLIP PLACEMENT  01/09/2020   Procedure: HEMOSTASIS CLIP PLACEMENT;  Surgeon: Rush Landmark Telford Nab., MD;  Location: [place];  Service: Gastroenterology;;   IR GENERIC HISTORICAL  07/09/2016   IR US GUIDE VASC ACCESS RIGHT 07/09/2016 Arne Cleveland, MD [facility]   IR GENERIC HISTORICAL  07/09/2016   IR FLUORO GUIDE CV LINE RIGHT 07/09/2016 Arne Cleveland, MD [facility]   KNEE ARTHROPLASTY Left    LAPAROSCOPIC SIGMOID COLECTOMY N/A 07/11/2016   Procedure: LAPAROSCOPIC SIGMOID COLECTOMY;  Surgeon: Clovis Riley, MD;  Location: [place];  Service: General;  Laterality: N/A;   [place]  01/09/2020   Procedure: Clide Deutscher;  Surgeon: Mansouraty, Telford Nab., MD;  Location: [place];  Service: Gastroenterology;;   SUBMUCOSAL LIFTING INJECTION  01/09/2020   Procedure: SUBMUCOSAL LIFTING INJECTION;  Surgeon: Irving Copas., MD;  Location: [facility] ENDOSCOPY;  Service: Gastroenterology;;   TUBAL LIGATION      Family History  Problem Relation Age of Onset   Heart failure Mother    Stroke Mother    Other Father    Colon cancer Neg Hx    Liver disease Neg Hx    Esophageal cancer Neg Hx    Stomach cancer Neg Hx    Inflammatory bowel disease Neg Hx    Rectal cancer Neg Hx    Pancreatic cancer Neg Hx     Social History   Tobacco Use   Smoking status: Never Smoker   Smokeless tobacco: Never Used  Vaping Use   Vaping Use: Never used  Substance Use Topics   Alcohol use: No   Drug use: No    Prior to Admission medications   Medication Sig Start Date End Date Taking? Authorizing Provider  acetaminophen (TYLENOL) 500 MG tablet Take 1,500 mg by mouth daily as needed for  headache.   Yes [provider]  albuterol (PROVENTIL HFA;VENTOLIN HFA) 108 (90 Base) MCG/ACT inhaler Inhale 1 puff into the lungs every 6 (six) hours as needed for wheezing or shortness of breath. Patient taking differently: Inhale 2 puffs into the lungs every 6 (six) hours as needed for wheezing or shortness of breath.  01/26/18  Yes [name], Jane G, MD  BESIVANCE 0.6 % SUSP Place 1 drop into the right eye 3 (three) times daily. 01/12/20  Yes [provider]  carvedilol (COREG) 25 MG tablet Take 25 mg by mouth 2 (two) times daily with a meal.   Yes [provider]  cinacalcet (SENSIPAR) 60 MG tablet Take 60 mg by mouth every Monday, Wednesday, and  Friday.  07/20/18  Yes [provider]  ethyl chloride spray Apply 1 application topically daily as needed (port access).  12/25/17  Yes [provider]  hydrALAZINE (APRESOLINE) 25 MG tablet Take 1 tablet (25 mg total) by mouth 3 (three) times daily. 12/22/19  Yes Adrian Prows, MD  isosorbide dinitrate (ISORDIL) 10 MG tablet Take 3 tablets (30 mg total) by mouth 3 (three) times daily. Patient taking differently: Take 20 mg by mouth 3 (three) times daily.  12/22/19  Yes Adrian Prows, MD  multivitamin (RENA-VIT) TABS tablet Take 1 tablet by mouth at bedtime.    Yes [provider]  nystatin cream (MYCOSTATIN) Apply 1 application topically 2 (two) times daily. Patient taking differently: Apply 1 application topically 2 (two) times daily as needed (rash).  08/12/19  Yes [name], Jane G, MD  omeprazole (PRILOSEC) 40 MG capsule Take 1 capsule (40 mg total) by mouth 2 (two) times daily before a meal. 01/09/20  Yes Mansouraty, Telford Nab., MD  polyethylene glycol (MIRALAX / GLYCOLAX) 17 g packet Take 17 g by mouth in the morning, at noon, and at bedtime.   Yes [provider]  PROLENSA 0.07 % SOLN Place 1 drop into the right eye every evening. 01/12/20  Yes [provider]  senna (SENOKOT) 8.6 MG tablet  Take 3 tablets by mouth 2 (two) times daily.   Yes [provider]  sevelamer carbonate (RENVELA) 800 MG tablet Take 1,600-2,400 mg by mouth See admin instructions. Take Take 1600 to 2400 mg with each meal   Yes [provider]  triamcinolone cream (KENALOG) 0.1 % Apply 1 application topically 2 (two) times daily. 08/12/19  Yes [name], Jane G, MD  warfarin (COUMADIN) 5 MG tablet TAKE 1 & 1/2 (ONE & ONE-HALF) TABLETS BY MOUTH EVERY TUESDAY AND EVERY THURSDAY AND 1 TABLET ONCE DAILY ALL OTHER DAYS Patient taking differently: Take 5-7.5 mg by mouth See admin instructions. Take 7.5 mg at night on Mon, Wed, and Fri. Take 5 mg at night on Sun, Tue, Thurs, and Sat 09/23/19  Yes [name], Jane G, MD  Methoxy PEG-Epoetin Beta (MIRCERA IJ) IV with dialysis 10/19/19 10/17/20  [provider]    Current Facility-Administered Medications  Medication Dose Route Frequency Provider Last Rate Last Admin   [facility] Hold] 0.9 %  sodium chloride infusion (Manually program via Guardrails IV Fluids)   Intravenous Once Toy Baker, MD       [facility] Hold] acetaminophen (TYLENOL) tablet 650 mg  650 mg Oral Q6H PRN Toy Baker, MD       Or   [facility] Hold] acetaminophen (TYLENOL) suppository 650 mg  650 mg Rectal Q6H PRN Toy Baker, MD       [MAR Hold] albuterol (PROVENTIL) (2.5 MG/3ML) 0.083% nebulizer solution 2.5 mg  2.5 mg Inhalation Q6H PRN Toy Baker, MD       [MAR Hold] cinacalcet (SENSIPAR) tablet 60 mg  60 mg Oral Q M,W,F Doutova, Anastassia, MD       [MAR Hold] ondansetron (ZOFRAN) injection 4 mg  4 mg Intravenous Q6H PRN Doutova, Anastassia, MD       pantoprazole (PROTONIX) 80 mg in sodium chloride 0.9 % 100 mL (0.8 mg/mL) infusion  8 mg/hr Intravenous Continuous Doutova, Anastassia, MD 10 mL/hr at 01/12/20 2257 8 mg/hr at 01/12/20 2257   [MAR Hold] sevelamer carbonate (RENVELA) tablet 1,600-2,400 mg  1,600-2,400 mg Oral TID WC Toy Baker, MD         Allergies as of 01/12/2020 -  Review Complete 01/12/2020  Allergen Reaction Noted   Latex Rash 07/09/2016   Penicillins  01/23/2014   Sulfa antibiotics Rash 01/23/2014   Tape Other (See Comments) 12/07/2012     Review of Systems:    Constitutional: No weight loss, fever or chills Skin: No rash  Cardiovascular: No chest pain   Respiratory: No SOB  Gastrointestinal: See HPI and otherwise negative Genitourinary: No dysuria Neurological: No headache, dizziness or syncope Musculoskeletal: No new muscle or joint pain Hematologic: No bruising Psychiatric: No history of depression or anxiety    Physical Exam:  Vital signs in last 24 hours: Temp:  [98.2 F (36.8 C)-98.7 F (37.1 C)] 98.7 F (37.1 C) (06/25 0821) Pulse Rate:  [55-82] 68 (06/25 0821) Resp:  [11-28] 16 (06/25 0821) BP: (80-145)/(26-72) 126/26 (06/25 0821) SpO2:  [83 %-100 %] 98 % (06/25 0821) Weight:  [105.2 kg-112.1 kg] 112.1 kg (06/25 0420) Last BM Date: 01/12/20 General:   Pleasant overweight AA female appears to be in NAD, Well developed, Well nourished, alert and cooperative Head:  Normocephalic and atraumatic. Eyes:   PEERL, EOMI. No icterus. Conjunctiva pink. Ears:  Normal auditory acuity. Neck:  Supple Throat: Oral cavity and pharynx without inflammation, swelling or lesion.  Lungs: Respirations even and unlabored. Lungs clear to auscultation bilaterally.   No wheezes, crackles, or rhonchi.  Heart: Normal S1, S2. No MRG. Regular rate and rhythm. No peripheral edema, cyanosis or pallor.  Abdomen:  Soft, nondistended, nontender. No rebound or guarding. Increased BS all 4 quadrants, No appreciable masses or hepatomegaly. Rectal:  Not performed.  Msk:  Symmetrical without gross deformities. Peripheral pulses intact.  Extremities:  Without edema, no deformity or joint abnormality.  Neurologic:  Alert and  oriented x4;  grossly normal neurologically. Skin:   Dry and intact without significant lesions or  rashes. Psychiatric: Demonstrates good judgement and reason without abnormal affect or behaviors.   LAB RESULTS: Recent Labs    01/12/20 0805 01/12/20 1942  WBC 4.3 3.8*  HGB 11.4* 9.0*  HCT 34.5* 30.7*  PLT 169.0 169   BMET Recent Labs    01/12/20 1942 01/13/20 0733  NA 138 139  K 4.8 4.8  CL 102 103  CO2 23 25  GLUCOSE 105* 92  BUN 41* 52*  CREATININE 11.20* 12.04*  CALCIUM 8.1* 7.8*   LFT Recent Labs    01/13/20 0733  PROT 5.4*  ALBUMIN 2.7*  AST 14*  ALT 12  ALKPHOS 83  BILITOT 0.7   PT/INR Recent Labs    01/12/20 1942  LABPROT 15.4*  INR 1.3*    Impression / Plan:   Impression: 1. GI bleed: hgb 11.4--9.0> since admission with at least 9 reported of maroon stools, the last about an hour ago, recent EGD with removal of large duodenal polyp thought most likely to be the source 2.  Acute blood loss anemia: From above 3.  ESRD on HD Monday Wednesday Friday 4.  A. fib on Coumadin: Took 1 dose 6/24 PM  Plan: 1.  Plans for EGD this morning with Dr. Henrene Pastor.  Did discuss risks, benefits, limitations and alternatives and patient agrees to proceed. 2.  Continue to monitor hemoglobin with transfusion as needed 3.  Please await any further recommendations from Dr. Henrene Pastor after time of procedure.  Thank you for your kind consultation, we will continue to follow.  Lavone Nian [facility]  01/13/2020, 8:36 AM  GI ATTENDING  History, laboratories, x-rays, endoscopy reports, pathology report all personally reviewed. Patient personally seen  and examined. Pleasant 74 year old with multiple significant medical problems who underwent endoscopic mucosal resection of large tubulovillous adenoma of the duodenum several days ago. Now presents with acute upper GI bleeding. Did have transient hypotension but is stable. Continues to have melena. Did receive 1 unit of packed red blood cells. Most recent hemoglobin pending. Case discussed with advanced therapeutic endoscopist, Dr.  Rush Landmark. Technical aspects reviewed. Patient needs urgent endoscopy. She needs to be intubated. She is HIGH RISK given her comorbidities and the nature of the lesion.The nature of the procedure, as well as the risks, benefits, and alternatives were carefully and thoroughly reviewed with the patient. Ample time for discussion and questions allowed. The patient understood, was satisfied, and agreed to proceed.  Docia Chuck. Geri Seminole., M.D. [facility] Division of Gastroenterology

## 2020-01-13 NOTE — Transfer of Care (Addendum)
Immediate Anesthesia Transfer of Care Note  Patient: Oneta Monger  Procedure(s) Performed: ESOPHAGOGASTRODUODENOSCOPY (EGD) WITH PROPOFOL (N/A ) HOT HEMOSTASIS (ARGON PLASMA COAGULATION/BICAP) (N/A ) SCLEROTHERAPY  Patient Location: Endoscopy Unit  Anesthesia Type:General  Level of Consciousness: awake, alert  and oriented  Airway & Oxygen Therapy: Patient Spontanous Breathing and Patient connected to face mask oxygen  Post-op Assessment: Report given to RN, Post -op Vital signs reviewed and stable and Patient moving all extremities  Post vital signs: Reviewed and stable  Last Vitals:  Vitals Value Taken Time  BP 160/38 01/13/20 1024  Temp    Pulse 73 01/13/20 1029  Resp 18 01/13/20 1029  SpO2 98 % 01/13/20 1029  Vitals shown include unvalidated device data.  Last Pain:  Vitals:   01/13/20 0821  TempSrc: Axillary  PainSc: 0-No pain         Complications: No complications documented.

## 2020-01-13 NOTE — Progress Notes (Signed)
PT Cancellation Note  Patient Details Name: Jane [name] MRN: [id number] DOB: [birth year]   Cancelled Treatment:    Reason Eval/Treat Not Completed: Patient at procedure or test/unavailable. Pt has been gone all day to endo then HD. Will try again tomorrow.    [place] 01/13/2020, 3:13 PM [place] Pager [phone] Office [phone]

## 2020-01-13 NOTE — Consult Note (Addendum)
[facility] Renal Consultation Note    Indication for Consultation:  Management of ESRD/hemodialysis, anemia, hypertension/volume, and secondary hyperparathyroidism.  HPI: Jane [name] is a 74 y.o. female with PMH including ESRD on dialysis MWF (last HD 6/22- rescheduled due to another procedure), a. Fib, HTN, diverticulitis, who presented to the ED on 01/12/20 with bloody bowel movements.  She called her GI doctor who recommended she present to the emergency department.  Patient had EGD on Monday then cataract surgery on 01/11/2020.  She is on Coumadin with INR 1.3.  ER was consulted and plan for EGD, which patient had this morning.  She also had a transfusion of packed red blood cells and FFP.  Blood pressure was soft on presentation but has improved to 133/69 at present.  Hemoglobin 8.2, white blood cell 8.7, K4.8, creatinine 12.04, calcium 7.8, albumin 2.7.  Nephrology has been consulted to manage ESRD.  Patient is seen in her room after EGD this morning.  Reports she is feeling tired and having some abdominal pain.  Reports her nausea has resolved.  She denies chest pains, palpitation, dizziness, shortness of breath, orthopnea, nausea, vomiting, diarrhea. Says that she feels better than when she came  Past Medical History:  Diagnosis Date  . Acid reflux   . Anemia of chronic disease   . Arthritis   . Asthma   . Atrial fibrillation ([place])   . Bilateral carotid bruits   . Complication of anesthesia    "hard to wake up, I have sleep apnea" no CPAP  . Depression   . Diverticulitis   . Dysrhythmia    Afib  . ESRD (end stage renal disease) ([place])    MWF [place]  . Headache   . History of blood transfusion   . Hypertension   . Malaise and fatigue   . Orthostatic hypotension   . Shortness of breath    " when I walk to fast"  . Sleep apnea   . Syncope   . Tubulovillous adenoma of colon    Past Surgical History:  Procedure Laterality Date  . AV FISTULA PLACEMENT    .  BACK SURGERY     Lumbar fusion L 4 and L 5  . BIOPSY  01/09/2020   Procedure: BIOPSY;  Surgeon: Rush Landmark Telford Nab., MD;  Location: [place];  Service: Gastroenterology;;  . COLONOSCOPY    . ENDOSCOPIC MUCOSAL RESECTION N/A 01/09/2020   Procedure: ENDOSCOPIC MUCOSAL RESECTION;  Surgeon: Rush Landmark Telford Nab., MD;  Location: [place];  Service: Gastroenterology;  Laterality: N/A;  . ESOPHAGOGASTRODUODENOSCOPY    . ESOPHAGOGASTRODUODENOSCOPY (EGD) WITH PROPOFOL N/A 01/09/2020   Procedure: ESOPHAGOGASTRODUODENOSCOPY (EGD) WITH PROPOFOL;  Surgeon: Rush Landmark Telford Nab., MD;  Location: [place];  Service: Gastroenterology;  Laterality: N/A;  . EUS N/A 01/09/2020   Procedure: UPPER ENDOSCOPIC ULTRASOUND (EUS) RADIAL;  Surgeon: Rush Landmark Telford Nab., MD;  Location: [facility];  Service: Gastroenterology;  Laterality: N/A;  . HEMOSTASIS CLIP PLACEMENT  01/09/2020   Procedure: HEMOSTASIS CLIP PLACEMENT;  Surgeon: Irving Copas., MD;  Location: [facility];  Service: Gastroenterology;;  . Everlean Alstrom GENERIC HISTORICAL  07/09/2016   IR US GUIDE VASC ACCESS RIGHT 07/09/2016 Arne Cleveland, MD [facility]  . IR GENERIC HISTORICAL  07/09/2016   IR FLUORO GUIDE CV LINE RIGHT 07/09/2016 Arne Cleveland, MD [facility]  . KNEE ARTHROPLASTY Left   . LAPAROSCOPIC SIGMOID COLECTOMY N/A 07/11/2016   Procedure: LAPAROSCOPIC SIGMOID COLECTOMY;  Surgeon: Clovis Riley, MD;  Location: [facility];  Service:  General;  Laterality: N/A;  . SCLEROTHERAPY  01/09/2020   Procedure: Clide Deutscher;  Surgeon: Rush Landmark Telford Nab., MD;  Location: [facility];  Service: Gastroenterology;;  . Lia Foyer LIFTING INJECTION  01/09/2020   Procedure: SUBMUCOSAL LIFTING INJECTION;  Surgeon: Irving Copas., MD;  [facility];  Service: Gastroenterology;;  . TUBAL LIGATION     Family History  Problem Relation Age of Onset  . Heart failure Mother   . Stroke Mother   . Other Father   .  Colon cancer Neg Hx   . Liver disease Neg Hx   . Esophageal cancer Neg Hx   . Stomach cancer Neg Hx   . Inflammatory bowel disease Neg Hx   . Rectal cancer Neg Hx   . Pancreatic cancer Neg Hx    Social History:  reports that she has never smoked. She has never used smokeless tobacco. She reports that she does not drink alcohol and does not use drugs.  ROS: As per HPI otherwise negative.   Physical Exam: Vitals:   01/13/20 0334 01/13/20 0335 01/13/20 0408 01/13/20 0420  BP:   (!) 136/49 94/72  Pulse: 72 74 69 77  Resp: 18 19 18 14   Temp:   98.2 F (36.8 C) 98.2 F (36.8 C)  TempSrc:   Oral Oral  SpO2: 91% 90% 97% 99%  Weight:    112.1 kg  Height:    5\' 5"  (1.651 m)     General: Well developed, well nourished, in no acute distress. Head: Normocephalic, atraumatic, sclera non-icteric, mucus membranes are moist. Neck:  JVD not elevated. Lungs: Clear bilaterally to auscultation without wheezes, rales, or rhonchi. Breathing is unlabored. Heart: RRR with normal S1, S2. No murmurs, rubs, or gallops appreciated. Abdomen: Soft, non-tender, non-distended with normoactive bowel sounds. No rebound/guarding. No obvious abdominal masses. Musculoskeletal:  Strength and tone appear normal for age. Lower extremities: No edema b/l lower extremities Neuro: Alert and oriented X 3. Moves all extremities spontaneously. Psych:  Responds to questions appropriately with a normal affect. Dialysis Access: RUE AVF + bruit  Allergies  Allergen Reactions  . Latex Rash  . Penicillins     Yeast infection   . Sulfa Antibiotics Rash  . Tape Other (See Comments)    Plastic, silicone, and paper tape causes bruising and pulls off skin. Cloth tape works fine   Prior to Admission medications   Medication Sig Start Date End Date Taking? Authorizing Provider  acetaminophen (TYLENOL) 500 MG tablet Take 1,500 mg by mouth daily as needed for headache.   Yes [provider]  albuterol (PROVENTIL  HFA;VENTOLIN HFA) 108 (90 Base) MCG/ACT inhaler Inhale 1 puff into the lungs every 6 (six) hours as needed for wheezing or shortness of breath. Patient taking differently: Inhale 2 puffs into the lungs every 6 (six) hours as needed for wheezing or shortness of breath.  01/26/18  Yes [name], [name], MD  BESIVANCE 0.6 % SUSP Place 1 drop into the right eye 3 (three) times daily. 01/12/20  Yes [provider]  carvedilol (COREG) 25 MG tablet Take 25 mg by mouth 2 (two) times daily with a meal.   Yes [provider]  cinacalcet (SENSIPAR) 60 MG tablet Take 60 mg by mouth every Monday, Wednesday, and Friday.  07/20/18  Yes [provider]  ethyl chloride spray Apply 1 application topically daily as needed (port access).  12/25/17  Yes [provider]  hydrALAZINE (APRESOLINE) 25 MG tablet Take 1 tablet (25 mg total)  by mouth 3 (three) times daily. 12/22/19  Yes Adrian Prows, MD  isosorbide dinitrate (ISORDIL) 10 MG tablet Take 3 tablets (30 mg total) by mouth 3 (three) times daily. Patient taking differently: Take 20 mg by mouth 3 (three) times daily.  12/22/19  Yes Adrian Prows, MD  multivitamin (RENA-VIT) TABS tablet Take 1 tablet by mouth at bedtime.    Yes [provider]  nystatin cream (MYCOSTATIN) Apply 1 application topically 2 (two) times daily. Patient taking differently: Apply 1 application topically 2 (two) times daily as needed (rash).  08/12/19  Yes [name], [name], MD  omeprazole (PRILOSEC) 40 MG capsule Take 1 capsule (40 mg total) by mouth 2 (two) times daily before a meal. 01/09/20  Yes Mansouraty, Telford Nab., MD  polyethylene glycol (MIRALAX / GLYCOLAX) 17 g packet Take 17 g by mouth in the morning, at noon, and at bedtime.   Yes [provider]  PROLENSA 0.07 % SOLN Place 1 drop into the right eye every evening. 01/12/20  Yes [provider]  senna (SENOKOT) 8.6 MG tablet Take 3 tablets by mouth 2 (two) times daily.   Yes [provider]  sevelamer carbonate (RENVELA) 800 MG tablet Take 1,600-2,400 mg by mouth See admin instructions. Take Take 1600 to 2400 mg with each meal   Yes [provider]  triamcinolone cream (KENALOG) 0.1 % Apply 1 application topically 2 (two) times daily. 08/12/19  Yes [name], [name], MD  warfarin (COUMADIN) 5 MG tablet TAKE 1 & 1/2 (ONE & ONE-HALF) TABLETS BY MOUTH EVERY TUESDAY AND EVERY THURSDAY AND 1 TABLET ONCE DAILY ALL OTHER DAYS Patient taking differently: Take 5-7.5 mg by mouth See admin instructions. Take 7.5 mg at night on Mon, Wed, and Fri. Take 5 mg at night on Sun, Tue, Thurs, and Sat 09/23/19  Yes [name], [name], MD  Methoxy PEG-Epoetin Beta (MIRCERA IJ) IV with dialysis 10/19/19 10/17/20  [provider]   Current Facility-Administered Medications  Medication Dose Route Frequency Provider Last Rate Last Admin  . 0.9 %  sodium chloride infusion (Manually program via Guardrails IV Fluids)   Intravenous Once Toy Baker, MD      . acetaminophen (TYLENOL) tablet 650 mg  650 mg Oral Q6H PRN Doutova, Anastassia, MD       Or  . acetaminophen (TYLENOL) suppository 650 mg  650 mg Rectal Q6H PRN Doutova, Anastassia, MD      . albuterol (PROVENTIL) (2.5 MG/3ML) 0.083% nebulizer solution 2.5 mg  2.5 mg Inhalation Q6H PRN Doutova, Anastassia, MD      . cinacalcet (SENSIPAR) tablet 60 mg  60 mg Oral Q M,W,F Doutova, Anastassia, MD      . ondansetron (ZOFRAN) injection 4 mg  4 mg Intravenous Q6H PRN Doutova, Anastassia, MD      . pantoprazole (PROTONIX) 80 mg in sodium chloride 0.9 % 100 mL (0.8 mg/mL) infusion  8 mg/hr Intravenous Continuous Doutova, Anastassia, MD 10 mL/hr at 01/12/20 2257 8 mg/hr at 01/12/20 2257  . sevelamer carbonate (RENVELA) tablet 1,600-2,400 mg  1,600-2,400 mg Oral TID WC Toy Baker, MD       Labs: Basic Metabolic Panel: Recent Labs  Lab 01/09/20 1021 01/09/20 1118 01/12/20 1942  NA 140 142 138  K 4.9 4.1 4.8  CL 101 99  102  CO2  --   --  23  GLUCOSE 95 92 105*  BUN 43* 39* 41*  CREATININE 11.10* 10.90* 11.20*  CALCIUM  --   --  8.1*   Liver Function Tests: Recent Labs  Lab 01/12/20 1942  AST 15  ALT 12  ALKPHOS 90  BILITOT 0.6  PROT 6.0*  ALBUMIN 3.0*   CBC: Recent Labs  Lab 01/09/20 1118 01/12/20 0805 01/12/20 1942  WBC  --  4.3 3.8*  NEUTROABS  --  2.0  --   HGB 12.2 11.4* 9.0*  HCT 36.0 34.5* 30.7*  MCV  --  88.9 95.6  PLT  --  169.0 169   CBG: Recent Labs  Lab 01/09/20 1816 01/13/20 0402  GLUCAP 94 82   Dialysis Orders:  Center: [facility]  on MWF. 180NRE, 3hr 45 min, BFR 450, DFR 800, EDW 106kg, 2K/3.5Ca, UF Profile 4, AVF 15g   Hectorol 3 mcg IV q HD Heparin 2500 unit bolus Midodrine 10mg  1 tab pre-HD and 1 tab mid treatment PRN Sensipar 60mg  daily Renvela 2 tabs TID with meals  Assessment/Plan: 1.  GI bleed: Presented with upper GI bleed in setting of recent biopsy on Coumadin.  Underwent endoscopy this morning.  Also received transfusion of packed red blood cells and FFP overnight.  Now on IV Protonix. 2.  ESRD:  Last HD 6/22 (rescheduled due to GI scope 6/21), then catacat surgery 6/23. K+ 4.8. Will plan for HD today after her GI procedure. Continue MWF schedule.  3.  Hypertension/volume: BP soft on admission, now stable. Patient does take midodrine predialysis as an outpatient.  Weights here variable but she is not clinically volume overloaded on exam. UFG 2L today, follow weights. Midodrine 10mg  ordered with HD as needed. 4.  Anemia: Worsening anemia due to above.    Will order ESA with dialysis today. 5.  Metabolic bone disease: Corrected calcium at goal. Continue hectorol, sensipar and renvela.  6.  Nutrition:  Will need renal diet/ fluid restrictions once tolerating PO  Anice Paganini, PA-C 01/13/2020, 8:14 AM  [facility] Pager: [phone]  Patient seen and examined, agree with above note with above modifications. Seen on HD-  She feels  better-  S/p EGD- results do not appear to be avail and blood-  Getting routine HD here today no heparin.  Cont with HD related meds including midodrine-  Volume status seeming good right now  Corliss Parish, MD 01/13/2020

## 2020-01-13 NOTE — Progress Notes (Signed)
IR aware of request for possible angiogram for GI bleed. Chart reviewed. Apparent successful clipping of bleeding vessel when taken to Endo today. Pt hemodynamically stable. Was taken to dialysis following recovery from endo. Will be in HD for a few more hours. Discussed case with Dr. Earleen Newport. IR available for possible angiogram and embolization.  Ascencion Dike PA-C Interventional Radiology 01/13/2020 2:55 PM

## 2020-01-13 NOTE — Plan of Care (Signed)
Plan of care initiated.

## 2020-01-13 NOTE — Progress Notes (Signed)
PROGRESS NOTE                                                                                                                                                                                                             Patient Demographics:    Jane [name], is a 74 y.o. female, DOB - July 09, 1946, UYQ:[id number]  Admit date - 01/12/2020   Admitting Physician Toy Baker, MD  Outpatient Primary MD for the patient is Jane, [name], MD  LOS - 1  Chief Complaint  Patient presents with  . Rectal Bleeding  . Post-op Problem       Brief Narrative - 74 year old morbidly obese Caucasian female with history of duodenal polyp/adenoma, s/p EGD and a polyp removal on 01/09/2020, ESRD on MWF schedule, paroxysmal atrial fibrillation on Coumadin, OSA on CPAP at night, history of hemorrhoids, hypertension who presented to the hospital with multiple dark stools after she started her Coumadin on the day of admission.  She was found to be anemic and admitted to the hospital.   Subjective:    Trulee Yazzie today has, No headache, No chest pain, No abdominal pain - No Nausea, No new weakness tingling or numbness, No Cough - SOB.    Assessment  & Plan :     1.  Acute blood loss anemia due to upper GI bleed.  With recent history of duodenal polyp removal with EGD on 01/09/2020, also suspicion for duodenal adenoma but recent EUS, she was recently started on Coumadin on the day of admission .  Bleeding could have been facilitated by Coumadin use, she has received 2 units of packed RBC on 01/13/2020, currently n.p.o. on IV PPI drip.  GI to see proceeding for EGD today.  Continue to monitor H&H.  2.  Paroxysmal atrial fibrillation.  Mali vas 2 score of greater than 3.  Hold Coumadin, resume Coreg.  3.  ESRD.  On MWF schedule, renal consulted.  4.  Essential hypertension - resume  home medications with a sip of water, place on as needed IV hydralazine for now.  5.  Morbid obesity with OSA.  BMI of  41, follow with PCP for weight loss, CPAP nightly.    Condition - Extremely Guarded  Family Communication  : Daughter [name] [phone]  - on 01/13/2020 at 11:04 AM  Code Status :  Full  Consults  :  GI, Renal  Procedures  :    EGD 01/13/20  -   PUD Prophylaxis : PPI  Disposition  Plan  :    Status is: Inpatient  Remains inpatient appropriate because:IV treatments appropriate due to intensity of illness or inability to take PO   Dispo: The patient is from: Home              Anticipated d/c is to: Home              Anticipated d/c date is: > 3 days              Patient currently is not medically stable to d/c.  DVT Prophylaxis  :   SCDs   Lab Results  Component Value Date   PLT 169 01/12/2020    Diet :  Diet Order            Diet clear liquid Room service appropriate? Yes; Fluid consistency: Thin  Diet effective now                  Inpatient Medications Scheduled Meds: . [MAR Hold] sodium chloride   Intravenous Once  . Chlorhexidine Gluconate Cloth  6 each Topical Q0600  . [MAR Hold] cinacalcet  60 mg Oral Q M,W,F  . [MAR Hold] sevelamer carbonate  1,600-2,400 mg Oral TID WC   Continuous Infusions: . sodium chloride    . pantoprozole (PROTONIX) infusion 8 mg/hr (01/12/20 2257)   PRN Meds:.[MAR Hold] acetaminophen **OR** [MAR Hold] acetaminophen, [MAR Hold] albuterol, [DISCONTINUED] ondansetron **OR** [MAR Hold] ondansetron (ZOFRAN) IV  Antibiotics  :   Anti-infectives (From admission, onward)   None          Objective:   Vitals:   01/13/20 0408 01/13/20 0420 01/13/20 0821 01/13/20 1027  BP: (!) 136/49 94/72 (!) 126/26 (!) 160/38  Pulse: 69 77 68 75  Resp: 18 14 16  (!) 24  Temp: 98.2 F (36.8 C) 98.2 F (36.8 C) 98.7 F (37.1 C) 97.8 F (36.6 C)  TempSrc: Oral Oral Axillary Oral  SpO2: 97% 99% 98% 97%  Weight:  112.1 kg    Height:  5\' 5"  (1.651 m)      SpO2: 97 % O2 Flow Rate (L/min): 5 L/min  Wt Readings from Last 3  Encounters:  01/13/20 112.1 kg  01/09/20 105.2 kg  12/01/19 107 kg     Intake/Output Summary (Last 24 hours) at 01/13/2020 1050 Last data filed at 01/13/2020 0423 Gross per 24 hour  Intake 1574.85 ml  Output 401 ml  Net 1173.85 ml     Physical Exam  Awake Alert, No new F.N deficits, Normal affect [place].AT,PERRAL Supple Neck,No JVD, No cervical lymphadenopathy appriciated.  Symmetrical Chest wall movement, Good air movement bilaterally, CTAB RRR,No Gallops,Rubs or new Murmurs, No Parasternal Heave +ve B.Sounds, Abd Soft, No tenderness, No organomegaly appriciated, No rebound - guarding or rigidity. No Cyanosis, Clubbing or edema, No new Rash or bruise        Data Review:    Recent Labs  Lab 01/09/20 1021 01/09/20 1118 01/12/20 0805 01/12/20 1942  WBC  --   --  4.3 3.8*  HGB 12.6 12.2 11.4* 9.0*  HCT 37.0 36.0 34.5* 30.7*  PLT  --   --  169.0 169  MCV  --   --  88.9 95.6  MCH  --   --   --  28.0  MCHC  --   --  33.0 29.3*  RDW  --   --  17.0* 16.2*  LYMPHSABS  --   --  0.9  --   MONOABS  --   --  0.6  --   EOSABS  --   --  0.8*  --   BASOSABS  --   --  0.0  --     Recent Labs  Lab 01/09/20 1021 01/09/20 1118 01/12/20 1942 01/13/20 0733  NA 140 142 138 139  K 4.9 4.1 4.8 4.8  CL 101 99 102 103  CO2  --   --  23 25  GLUCOSE 95 92 105* 92  BUN 43* 39* 41* 52*  CREATININE 11.10* 10.90* 11.20* 12.04*  CALCIUM  --   --  8.1* 7.8*  AST  --   --  15 14*  ALT  --   --  12 12  ALKPHOS  --   --  90 83  BILITOT  --   --  0.6 0.7  ALBUMIN  --   --  3.0* 2.7*  MG  --   --   --  2.0  INR  --   --  1.3* 1.3*  TSH  --   --   --  0.351  BNP  --   --   --  122.4*    Recent Labs  Lab 01/12/20 2204 01/13/20 0733  BNP  --  122.4*  SARSCOV2NAA NEGATIVE  --     ------------------------------------------------------------------------------------------------------------------ No results for input(s): CHOL, HDL, LDLCALC, TRIG, CHOLHDL, LDLDIRECT in the last 72  hours.  No results found for: HGBA1C ------------------------------------------------------------------------------------------------------------------ Recent Labs    01/13/20 0733  TSH 0.351   ------------------------------------------------------------------------------------------------------------------ No results for input(s): VITAMINB12, FOLATE, FERRITIN, TIBC, IRON, RETICCTPCT in the last 72 hours.  Coagulation profile Recent Labs  Lab 01/12/20 1942 01/13/20 0733  INR 1.3* 1.3*    No results for input(s): DDIMER in the last 72 hours.  Cardiac Enzymes No results for input(s): CKMB, TROPONINI, MYOGLOBIN in the last 168 hours.  Invalid input(s): CK ------------------------------------------------------------------------------------------------------------------    Component Value Date/Time   BNP 122.4 (H) 01/13/2020 1610    Micro Results Recent Results (from the past 240 hour(s))  SARS CORONAVIRUS 2 (TAT 6-24 HRS) Nasopharyngeal Nasopharyngeal Swab     Status: None   Collection Time: 01/05/20  8:54 AM   Specimen: Nasopharyngeal Swab  Result Value Ref Range Status   SARS Coronavirus 2 NEGATIVE NEGATIVE Final    Comment: (NOTE) SARS-CoV-2 target nucleic acids are NOT DETECTED.  The SARS-CoV-2 RNA is generally detectable in upper and lower respiratory specimens during the acute phase of infection. Negative results do not preclude SARS-CoV-2 infection, do not rule out co-infections with other pathogens, and should not be used as the sole basis for treatment or other patient management decisions. Negative results must be combined with clinical observations, patient history, and epidemiological information. The expected result is Negative.  Fact Sheet for Patients: SugarRoll.be  Fact Sheet for Healthcare Providers: https://www.woods-mathews.com/  This test is not yet approved or cleared by the [place] FDA and  has  been authorized for detection and/or diagnosis of SARS-CoV-2 by FDA under an Emergency Use Authorization (EUA). This EUA will remain  in effect (meaning this test can be used) for the duration of the COVID-19 declaration under Se ction 564(b)(1) of the Act, 21 U.S.C. section 360bbb-3(b)(1), unless the authorization is terminated or revoked sooner.  Performed at [facility], [place] [address] [place], [place] [address]   SARS Coronavirus 2 by RT PCR (hospital order, performed in [facility] hospital lab) Nasopharyngeal Nasopharyngeal Swab     Status: None   Collection Time: 01/12/20 10:04  PM   Specimen: Nasopharyngeal Swab  Result Value Ref Range Status   SARS Coronavirus 2 NEGATIVE NEGATIVE Final    Comment: (NOTE) SARS-CoV-2 target nucleic acids are NOT DETECTED.  The SARS-CoV-2 RNA is generally detectable in upper and lower respiratory specimens during the acute phase of infection. The lowest concentration of SARS-CoV-2 viral copies this assay can detect is 250 copies / mL. A negative result does not preclude SARS-CoV-2 infection and should not be used as the sole basis for treatment or other patient management decisions.  A negative result may occur with improper specimen collection / handling, submission of specimen other than nasopharyngeal swab, presence of viral mutation(s) within the areas targeted by this assay, and inadequate number of viral copies (<250 copies / mL). A negative result must be combined with clinical observations, patient history, and epidemiological information.  Fact Sheet for Patients:   StrictlyIdeas.no  Fact Sheet for Healthcare Providers: BankingDealers.co.za  This test is not yet approved or  cleared by the [place] FDA and has been authorized for detection and/or diagnosis of SARS-CoV-2 by FDA under an Emergency Use Authorization (EUA).  This EUA will remain in effect (meaning this test  can be used) for the duration of the COVID-19 declaration under Section 564(b)(1) of the Act, 21 U.S.C. section 360bbb-3(b)(1), unless the authorization is terminated or revoked sooner.  Performed at [facility], [place] [address], [place], [place] [address]   MRSA PCR Screening     Status: None   Collection Time: 01/13/20  4:44 AM   Specimen: Nasal Mucosa; Nasopharyngeal  Result Value Ref Range Status   MRSA by PCR NEGATIVE NEGATIVE Final    Comment:        The GeneXpert MRSA Assay (FDA approved for NASAL specimens only), is one component of a comprehensive MRSA colonization surveillance program. It is not intended to diagnose MRSA infection nor to guide or monitor treatment for MRSA infections. Performed at [facility], [place] [address] [place], [place] [address]     Radiology Reports No results found.  Time Spent in minutes  30   Lala Lund M.D on 01/13/2020 at 10:50 AM  To page go to [email or web address] - password [facility]

## 2020-01-13 NOTE — Telephone Encounter (Signed)
Gabe thanks for your input. Jenny Reichmann will be seeing this patient and taking care of her procedure today. Denice Bors. Thanks

## 2020-01-13 NOTE — Anesthesia Procedure Notes (Signed)
Procedure Name: Intubation Date/Time: 01/13/2020 9:14 AM Performed by: Amadeo Garnet, CRNA Pre-anesthesia Checklist: Patient identified, Emergency Drugs available, Suction available and Patient being monitored Patient Re-evaluated:Patient Re-evaluated prior to induction Oxygen Delivery Method: Circle system utilized Preoxygenation: Pre-oxygenation with 100% oxygen Induction Type: IV induction Ventilation: Mask ventilation without difficulty Laryngoscope Size: Mac and 3 Grade View: Grade I Tube type: Oral Tube size: 7.0 mm Number of attempts: 1 Airway Equipment and Method: Stylet Placement Confirmation: ETT inserted through vocal cords under direct vision,  positive ETCO2 and breath sounds checked- equal and bilateral Secured at: 21 cm Tube secured with: Tape Dental Injury: Teeth and Oropharynx as per pre-operative assessment

## 2020-01-14 LAB — CBC WITH DIFFERENTIAL/PLATELET
Abs Immature Granulocytes: 0.01 10*3/uL (ref 0.00–0.07)
Basophils Absolute: 0 10*3/uL (ref 0.0–0.1)
Basophils Relative: 0 %
Eosinophils Absolute: 0.4 10*3/uL (ref 0.0–0.5)
Eosinophils Relative: 7 %
HCT: 23.4 % — ABNORMAL LOW (ref 36.0–46.0)
Hemoglobin: 6.9 g/dL — CL (ref 12.0–15.0)
Immature Granulocytes: 0 %
Lymphocytes Relative: 16 %
Lymphs Abs: 0.8 10*3/uL (ref 0.7–4.0)
MCH: 28.5 pg (ref 26.0–34.0)
MCHC: 29.5 g/dL — ABNORMAL LOW (ref 30.0–36.0)
MCV: 96.7 fL (ref 80.0–100.0)
Monocytes Absolute: 0.6 10*3/uL (ref 0.1–1.0)
Monocytes Relative: 12 %
Neutro Abs: 3.3 10*3/uL (ref 1.7–7.7)
Neutrophils Relative %: 65 %
Platelets: 132 10*3/uL — ABNORMAL LOW (ref 150–400)
RBC: 2.42 MIL/uL — ABNORMAL LOW (ref 3.87–5.11)
RDW: 16.3 % — ABNORMAL HIGH (ref 11.5–15.5)
WBC: 5 10*3/uL (ref 4.0–10.5)
nRBC: 0 % (ref 0.0–0.2)

## 2020-01-14 LAB — COMPREHENSIVE METABOLIC PANEL
ALT: 12 U/L (ref 0–44)
AST: 14 U/L — ABNORMAL LOW (ref 15–41)
Albumin: 2.6 g/dL — ABNORMAL LOW (ref 3.5–5.0)
Alkaline Phosphatase: 75 U/L (ref 38–126)
Anion gap: 14 (ref 5–15)
BUN: 22 mg/dL (ref 8–23)
CO2: 23 mmol/L (ref 22–32)
Calcium: 8 mg/dL — ABNORMAL LOW (ref 8.9–10.3)
Chloride: 99 mmol/L (ref 98–111)
Creatinine, Ser: 6.61 mg/dL — ABNORMAL HIGH (ref 0.44–1.00)
GFR calc Af Amer: 7 mL/min — ABNORMAL LOW (ref >60)
GFR calc non Af Amer: 6 mL/min — ABNORMAL LOW (ref >60)
Glucose, Bld: 88 mg/dL (ref 70–99)
Potassium: 4 mmol/L (ref 3.5–5.1)
Sodium: 136 mmol/L (ref 135–145)
Total Bilirubin: 0.7 mg/dL (ref 0.3–1.2)
Total Protein: 5.2 g/dL — ABNORMAL LOW (ref 6.5–8.1)

## 2020-01-14 LAB — CBC
HCT: 22 % — ABNORMAL LOW (ref 36.0–46.0)
HCT: 26.9 % — ABNORMAL LOW (ref 36.0–46.0)
HCT: 26.5 % — ABNORMAL LOW (ref 36.0–46.0)
Hemoglobin: 6.7 g/dL — CL (ref 12.0–15.0)
Hemoglobin: 8.4 g/dL — ABNORMAL LOW (ref 12.0–15.0)
Hemoglobin: 8.3 g/dL — ABNORMAL LOW (ref 12.0–15.0)
MCH: 27.9 pg (ref 26.0–34.0)
MCH: 28.1 pg (ref 26.0–34.0)
MCH: 28.1 pg (ref 26.0–34.0)
MCHC: 30.5 g/dL (ref 30.0–36.0)
MCHC: 31.2 g/dL (ref 30.0–36.0)
MCHC: 31.3 g/dL (ref 30.0–36.0)
MCV: 91.7 fL (ref 80.0–100.0)
MCV: 90 fL (ref 80.0–100.0)
MCV: 89.8 fL (ref 80.0–100.0)
Platelets: 126 10*3/uL — ABNORMAL LOW (ref 150–400)
Platelets: 130 10*3/uL — ABNORMAL LOW (ref 150–400)
Platelets: 135 10*3/uL — ABNORMAL LOW (ref 150–400)
RBC: 2.4 MIL/uL — ABNORMAL LOW (ref 3.87–5.11)
RBC: 2.99 MIL/uL — ABNORMAL LOW (ref 3.87–5.11)
RBC: 2.95 MIL/uL — ABNORMAL LOW (ref 3.87–5.11)
RDW: 16.2 % — ABNORMAL HIGH (ref 11.5–15.5)
RDW: 16.5 % — ABNORMAL HIGH (ref 11.5–15.5)
RDW: 16.6 % — ABNORMAL HIGH (ref 11.5–15.5)
WBC: 5.3 10*3/uL (ref 4.0–10.5)
WBC: 5.7 10*3/uL (ref 4.0–10.5)
WBC: 5.9 10*3/uL (ref 4.0–10.5)
nRBC: 0 % (ref 0.0–0.2)
nRBC: 0 % (ref 0.0–0.2)
nRBC: 0 % (ref 0.0–0.2)

## 2020-01-14 LAB — MAGNESIUM: Magnesium: 1.8 mg/dL (ref 1.7–2.4)

## 2020-01-14 LAB — PREPARE RBC (CROSSMATCH)

## 2020-01-14 LAB — BRAIN NATRIURETIC PEPTIDE: B Natriuretic Peptide: 81.1 pg/mL (ref 0.0–100.0)

## 2020-01-14 MED ORDER — SODIUM CHLORIDE 0.9 % IV BOLUS
500.0000 mL | Freq: Once | INTRAVENOUS | Status: AC
  Administered 2020-01-14: 500 mL via INTRAVENOUS

## 2020-01-14 MED ORDER — SODIUM CHLORIDE 0.9% IV SOLUTION: Freq: Once | INTRAVENOUS | Status: DC

## 2020-01-14 MED ORDER — CARVEDILOL 6.25 MG PO TABS: 6.2500 mg | ORAL_TABLET | Freq: Two times a day (BID) | ORAL | Status: DC

## 2020-01-14 MED ORDER — VITAMIN K1 10 MG/ML IJ SOLN
2.0000 mg | Freq: Once | INTRAVENOUS | Status: AC
  Administered 2020-01-14: 2 mg via INTRAVENOUS
  Filled 2020-01-14: qty 0.2

## 2020-01-14 MED ORDER — FUROSEMIDE 10 MG/ML IJ SOLN
40.0000 mg | Freq: Once | INTRAMUSCULAR | Status: AC
  Administered 2020-01-14: 40 mg via INTRAVENOUS
  Filled 2020-01-14: qty 4

## 2020-01-14 MED ORDER — ISOSORBIDE DINITRATE 20 MG PO TABS
20.0000 mg | ORAL_TABLET | Freq: Three times a day (TID) | ORAL | Status: DC
  Filled 2020-01-14: qty 1

## 2020-01-14 NOTE — Progress Notes (Addendum)
S/p bolus  bp 102/39,  H/h 6.9  Transfuse 1 units over 4 hours Rn to update MD re further patient needs Primary MD to follow

## 2020-01-14 NOTE — Progress Notes (Signed)
CRITICAL VALUE ALERT  Critical Value:  6.9 hgb  Date & Time Notied:  01/14/20  Provider Notified: Marcello Moores  Orders Received/Actions taken: Awaiting order for type & cross, and one unit of blood.   Will pass on to next nurse.

## 2020-01-14 NOTE — Progress Notes (Signed)
Patients BP dropped. States she has a headache. Paged MD on call. Instructed to give 500 mL bolus. Will continue to monitor.    01/14/20 0009  Assess: MEWS Score  Temp 98.4 F (36.9 C)  BP (!) 75/41  Pulse Rate 77  ECG Heart Rate 76  Resp 19  SpO2 98 %  Assess: MEWS Score  MEWS Temp 0  MEWS Systolic 2  MEWS Pulse 0  MEWS RR 0  MEWS LOC 0  MEWS Score 2  MEWS Score Color Yellow  Assess: if the MEWS score is Yellow or Red  Were vital signs taken at a resting state? Yes  Focused Assessment Documented focused assessment  Early Detection of Sepsis Score *See Row Information* Low  MEWS guidelines implemented *See Row Information* Yes  Treat  MEWS Interventions Other (Comment) (paged md)  Take Vital Signs  Increase Vital Sign Frequency  Yellow: Q 2hr X 2 then Q 4hr X 2, if remains yellow, continue Q 4hrs  Escalate  MEWS: Escalate Yellow: discuss with charge nurse/RN and consider discussing with provider and RRT  Notify: Charge Nurse/RN  Name of Charge Nurse/RN Notified Annett Fabian, rn  Date Charge Nurse/RN Notified 01/14/20  Time Charge Nurse/RN Notified 0028  Notify: Provider  Provider Name/Title S.Marcello Moores  Date Provider Notified 01/14/20  Time Provider Notified 0030  Notification Type Page  Notification Reason Change in status;Other (Comment) (BP low )  Response See new orders (give 250 bolus )  Date of Provider Response 01/14/20  Time of Provider Response [phone]  Document  Patient Outcome Stabilized after interventions  Progress note created (see row info) Yes

## 2020-01-14 NOTE — Progress Notes (Signed)
74 year old morbidly obese Caucasian female with history of duodenal polyp/adenoma, s/p EGD and a polyp removal on 01/09/2020, ESRD on MWF schedule, paroxysmal atrial fibrillation on Coumadin, OSA on CPAP at night, history of hemorrhoids, hypertension who presented to the hospital with multiple dark stools after she started her Coumadin on the day of admission.  Patient dx with acute upper gi bleed. Since admission patient is s/p 2 units PRBC. Called by rn due to bp with systolic 02-54'Y  Hours s/p patient was given imdur and hydralazine. Prior to getting these medications patient bp was 120/40's per chart.    Plan  - stat h/h  -transfuse if < 8  -ns 500 cc now   RN To update MD s/p bolus or prior to this for further patient needs.  Will continue to follow

## 2020-01-14 NOTE — Progress Notes (Signed)
Progress Note for [facility]  Subjective: No complaints.  Feeling well.  Objective: Vital signs in last 24 hours: Temp:  [97.9 F (36.6 C)-99.2 F (37.3 C)] 98.4 F (36.9 C) (06/26 0805) Pulse Rate:  [60-87] 76 (06/26 0750) Resp:  [14-22] 18 (06/26 0750) BP: (75-151)/(17-48) 106/36 (06/26 0805) SpO2:  [83 %-100 %] 88 % (06/26 0500) Weight:  [104.6 kg-107.5 kg] 104.6 kg (06/25 1748) Last BM Date: 01/14/20  Intake/Output from previous day: 06/25 0701 - 06/26 0700 In: 428.9 [I.V.:57; IV Piggyback:372] Out: 2000  Intake/Output this shift: No intake/output data recorded.  General appearance: alert and no distress GI: soft, non-tender; bowel sounds normal; no masses,  no organomegaly  Lab Results: Recent Labs    01/13/20 1840 01/14/20 0234 01/14/20 0545  WBC 5.7 5.3 5.0  HGB 8.4* 6.7* 6.9*  HCT 26.9* 22.0* 23.4*  PLT 139* 126* 132*   BMET Recent Labs    01/12/20 1942 01/13/20 0733 01/14/20 0545  NA 138 139 136  K 4.8 4.8 4.0  CL 102 103 99  CO2 23 25 23   GLUCOSE 105* 92 88  BUN 41* 52* 22  CREATININE 11.20* 12.04* 6.61*  CALCIUM 8.1* 7.8* 8.0*   LFT Recent Labs    01/14/20 0545  PROT 5.2*  ALBUMIN 2.6*  AST 14*  ALT 12  ALKPHOS 75  BILITOT 0.7   PT/INR Recent Labs    01/12/20 1942 01/13/20 0733  LABPROT 15.4* 15.3*  INR 1.3* 1.3*   Hepatitis Panel No results for input(s): HEPBSAG, HCVAB, HEPAIGM, HEPBIGM in the last 72 hours. C-Diff No results for input(s): CDIFFTOX in the last 72 hours. Fecal Lactopherrin No results for input(s): FECLLACTOFRN in the last 72 hours.  Studies/Results: No results found.  Medications:  Scheduled: . sodium chloride   Intravenous Once  . carvedilol  6.25 mg Oral BID WC  . Chlorhexidine Gluconate Cloth  6 each Topical Q0600  . cinacalcet  60 mg Oral Q M,W,F  . darbepoetin (ARANESP) injection - DIALYSIS  40 mcg Intravenous Q Fri-HD  . doxercalciferol  3 mcg Intravenous Q M,W,F-HD  . furosemide  40 mg  Intravenous Once  . [START ON 01/15/2020] isosorbide dinitrate  20 mg Oral TID  . sevelamer carbonate  1,600-2,400 mg Oral TID WC   Continuous: . pantoprozole (PROTONIX) infusion 8 mg/hr (01/14/20 0049)  . phytonadione (VITAMIN K) IV      Assessment/Plan: 1) S/p post-polypectomy bleed. 2) Anemia.   The drop in her HGB is reflective of the bleeding that occurred prior to the repeat EGD.  She denies any further bleeding after the procedure yesterday.    Plan: 1) Agree with the transfusions. 2) Monitor HGB and any overt signs of bleeding. 3) If severe bleeding occurs, then IR assistance will be required to arrest the bleeding.  LOS: 2 days   Carlus Stay D 01/14/2020, 11:13 AM

## 2020-01-14 NOTE — Evaluation (Signed)
Physical Therapy Evaluation Patient Details Name: Jane [name] MRN: [id number] DOB: 12/31/1945 Today's Date: 01/14/2020   History of Present Illness  Pt is a 74 yo female presenting with multiple dark bloody stools following start of Coumadin. The pt is reccently s/p EGD and polyp removal 6/21, with PMH sig for duodenal polyp/adenoma, ESRD on HD MWF, afib on Coumadin, OSA on CPAP, HTN, and morbid obesity. PT received 2units PRBC 6/26.  Clinical Impression  Pt in bed upon arrival of PT, agreeable to evaluation at this time. Prior to admission the pt was independent with use of rollator for mobility, living alone and able to manage all ADLs and IADLs without assist (took extra time). The pt now presents with limitations in functional mobility, strength, and endurance due to above dx and resulting fatigue, and will continue to benefit from skilled PT to address these deficits. The pt was able to complete x3 sit-stand transfers through today's session and a few lateral steps to reposition at the [facility], but declined further mobility at this time due to reports of significant generalized fatigue. The pt hopes to return home without need for continued therapy, and will therefore benefit from maximal therapies acutely to increase endurance and independence with mobility to allow for safe return home.      Follow Up Recommendations Home health PT;Supervision/Assistance - 24 hour    Equipment Recommendations  3in1 (PT)    Recommendations for Other Services       Precautions / Restrictions Precautions Precautions: Fall Restrictions Weight Bearing Restrictions: No      Mobility  Bed Mobility Overal bed mobility: Modified Independent             General bed mobility comments: pt able to come to sitting EOB without assist, some supervision for lines but able to adjust and move in bed without assist  Transfers Overall transfer level: Needs assistance Equipment used: Rolling walker (2  wheeled) Transfers: Sit to/from Stand Sit to Stand: Min assist         General transfer comment: minA to power up initally, pt able to rise without assist with extra time. Vc for hand placement but poor adherence  Ambulation/Gait Ambulation/Gait assistance: Min assist Gait Distance (Feet): 5 Feet Assistive device: Rolling walker (2 wheeled) Gait Pattern/deviations: Step-to pattern;Decreased stride length;Trunk flexed     General Gait Details: pt with short, shuffle steps to [facility], declining further mobility reporting sig fatigue today     Balance Overall balance assessment: Needs assistance Sitting-balance support: No upper extremity supported;Feet supported Sitting balance-Leahy Scale: Good     Standing balance support: Bilateral upper extremity supported;During functional activity Standing balance-Leahy Scale: Poor Standing balance comment: reliant on BUE support                             Pertinent Vitals/Pain Pain Assessment: Faces Faces Pain Scale: Hurts a little bit Pain Location: pt reports stomach pain from hunger Pain Intervention(s): Repositioned;Limited activity within patient's tolerance;Monitored during session    Home Living Family/patient expects to be discharged to:: Private residence Living Arrangements: Alone Available Help at Discharge: Family;Available PRN/intermittently (daughter lives nearby, works during the day) Type of Home: Apartment Home Access: Level entry     Home Layout: One level [place]: Walker - 4 wheels;Grab bars - tub/shower;Grab bars - toilet      Prior Function Level of Independence: Independent with assistive device(s)         Comments: pt reports  independent with ADLs, walks with Rollator.     Hand Dominance   Dominant Hand: Right    Extremity/Trunk Assessment   Upper Extremity Assessment Upper Extremity Assessment: Generalized weakness    Lower Extremity Assessment Lower Extremity Assessment:  Generalized weakness (pt with globalized weakness through session. able to complete functional transfers)    Cervical / Trunk Assessment Cervical / Trunk Assessment: Normal  Communication   Communication: No difficulties  Cognition Arousal/Alertness: Awake/alert Behavior During Therapy: WFL for tasks assessed/performed Overall Cognitive Status: Within Functional Limits for tasks assessed                                 General Comments: pt with good safety awareness      General Comments General comments (skin integrity, edema, etc.): VSS on RA. pt reporting sig fatigue    Exercises     Assessment/Plan    PT Assessment Patient needs continued PT services  PT Problem List Decreased strength;Decreased mobility;Decreased activity tolerance;Decreased balance;Obesity       PT Treatment Interventions DME instruction;Gait training;Functional mobility training;Therapeutic activities;Therapeutic exercise;Balance training;Patient/family education    PT Goals (Current goals can be found in the Care Plan section)  Acute Rehab PT Goals Patient Stated Goal: to return home without need for continued PT PT Goal Formulation: With patient Time For Goal Achievement: 01/28/20 Potential to Achieve Goals: Fair    Frequency Min 3X/week   Barriers to discharge   pt lives alone, daughter can visit but not stay 24/7       AM-PAC PT "6 Clicks" Mobility  Outcome Measure Help needed turning from your back to your side while in a flat bed without using bedrails?: None Help needed moving from lying on your back to sitting on the side of a flat bed without using bedrails?: A Little Help needed moving to and from a bed to a chair (including a wheelchair)?: A Little Help needed standing up from a chair using your arms (e.g., wheelchair or bedside chair)?: A Little Help needed to walk in hospital room?: A Lot Help needed climbing 3-5 steps with a railing? : Total 6 Click Score: 16     End of Session Equipment Utilized During Treatment: Gait belt Activity Tolerance: Patient limited by fatigue Patient left: in bed;with call bell/phone within reach Nurse Communication: Mobility status PT Visit Diagnosis: Muscle weakness (generalized) (M62.81);Difficulty in walking, not elsewhere classified (R26.2)    Time: 0488-8916 PT Time Calculation (min) (ACUTE ONLY): 20 min   Charges:   PT Evaluation $PT Eval Moderate Complexity: 1 Mod          Karma Ganja, PT, DPT   Acute Rehabilitation Department Pager #: [phone]  Otho Bellows 01/14/2020, 3:30 PM

## 2020-01-14 NOTE — Progress Notes (Signed)
PROGRESS NOTE                                                                                                                                                                                                             Patient Demographics:    Jane [name], is a 74 y.o. female, DOB - 07-Aug-1945, QIW:[id number]  Admit date - 01/12/2020   Admitting Physician Toy Baker, MD  Outpatient Primary MD for the patient is [name], [name], MD  LOS - 2  Chief Complaint  Patient presents with  . Rectal Bleeding  . Post-op Problem       Brief Narrative - 74 year old morbidly obese Caucasian female with history of duodenal polyp/adenoma, s/p EGD and a polyp removal on 01/09/2020, ESRD on MWF schedule, paroxysmal atrial fibrillation on Coumadin, OSA on CPAP at night, history of hemorrhoids, hypertension who presented to the hospital with multiple dark stools after she started her Coumadin on the day of admission.  She was found to be anemic and admitted to the hospital.   Subjective:   Patient in bed, appears comfortable, denies any headache, no fever, no chest pain or pressure, no shortness of breath , no abdominal pain. No focal weakness.  No further bloody bowel movements since last afternoon.   Assessment  & Plan :     1.  Acute blood loss anemia due to upper GI bleed.  With recent history of duodenal polyp removal with EGD on 01/09/2020, also suspicion for duodenal adenoma but recent EUS, she was recently started on Coumadin on the day of admission .  Received 2 unit of packed RBC on 01/13/2020 long with 1 unit of FFP and another 2 units of packed RBCs will be given on 01/14/2020, I think this is old bleeding, she is also seen by GI and underwent EGD showing bleeding site at the recent duodenal polyp removal, this was stabilized, I have also discussed the case with IR physician Dr. Pascal Lux on 01/14/2020.  H&H will be closely monitored, if there are signs of ongoing bleeding she will  undergo embolization.  2.  Paroxysmal atrial fibrillation.  Mali vas 2 score of greater than 3.  Hold Coumadin, Anoro still 1.3 she received a unit of FFP on 01/13/2020 will give her 2 mg of vitamin K on 01/14/2020 and monitor, INR is not too high, resume Coreg at lower than home dose monitoring her blood pressure..  3.  ESRD.  On MWF schedule, renal consulted.  4.  Essential hypertension -  currently only on low-dose Coreg.  Transfuse and monitor.  5.  Morbid obesity with OSA.  BMI of 41, follow with PCP for weight loss, CPAP nightly.    Condition - Extremely Guarded  Family Communication  : Daughter [name] [phone]  - on 01/13/2020 at 11:04 AM  Code Status :  Full  Consults  :  GI, Renal, IR  Procedures  :    EGD 01/13/20  - showing bleeding at duodenal polyp removal site.  PUD Prophylaxis : PPI  Disposition Plan  :    Status is: Inpatient  Remains inpatient appropriate because:IV treatments appropriate due to intensity of illness or inability to take PO   Dispo: The patient is from: Home              Anticipated d/c is to: Home              Anticipated d/c date is: > 3 days              Patient currently is not medically stable to d/c.  DVT Prophylaxis  :   SCDs   Lab Results  Component Value Date   PLT 132 (L) 01/14/2020    Diet :  Diet Order            Diet clear liquid Room service appropriate? No; Fluid consistency: Thin  Diet effective now                  Inpatient Medications Scheduled Meds: . sodium chloride   Intravenous Once  . carvedilol  6.25 mg Oral BID WC  . Chlorhexidine Gluconate Cloth  6 each Topical Q0600  . cinacalcet  60 mg Oral Q M,W,F  . darbepoetin (ARANESP) injection - DIALYSIS  40 mcg Intravenous Q Fri-HD  . doxercalciferol  3 mcg Intravenous Q M,W,F-HD  . furosemide  40 mg Intravenous Once  . [START ON 01/15/2020] isosorbide dinitrate  20 mg Oral TID  . sevelamer carbonate  1,600-2,400 mg Oral TID WC   Continuous  Infusions: . pantoprozole (PROTONIX) infusion 8 mg/hr (01/14/20 0049)   PRN Meds:.acetaminophen **OR** [DISCONTINUED] acetaminophen, albuterol, hydrALAZINE, [DISCONTINUED] ondansetron **OR** ondansetron (ZOFRAN) IV  Antibiotics  :   Anti-infectives (From admission, onward)   None          Objective:   Vitals:   01/14/20 0400 01/14/20 0500 01/14/20 0750 01/14/20 0805  BP: (!) 116/46 (!) 106/43 (!) 99/35 (!) 106/36  Pulse: 85 83 76   Resp: (!) 21 (!) 22 18   Temp: 98.5 F (36.9 C) 99.2 F (37.3 C) 98.7 F (37.1 C) 98.4 F (36.9 C)  TempSrc: Oral Oral Oral Oral  SpO2: (!) 86% (!) 88%    Weight:      Height:        SpO2: (!) 88 % O2 Flow Rate (L/min): 2 L/min  Wt Readings from Last 3 Encounters:  01/13/20 104.6 kg  01/09/20 105.2 kg  12/01/19 107 kg     Intake/Output Summary (Last 24 hours) at 01/14/2020 0955 Last data filed at 01/14/2020 0300 Gross per 24 hour  Intake 428.92 ml  Output 2000 ml  Net -1571.08 ml     Physical Exam  Awake Alert, No new F.N deficits, Normal affect .AT,PERRAL Supple Neck,No JVD, No cervical lymphadenopathy appriciated.  Symmetrical Chest wall movement, Good air movement bilaterally, CTAB RRR,No Gallops, Rubs or new Murmurs, No Parasternal Heave +ve B.Sounds, Abd Soft, No tenderness, No organomegaly appriciated, No rebound - guarding or rigidity.  No Cyanosis, Clubbing or edema, No new Rash or bruise     Data Review:    Recent Labs  Lab 01/12/20 0805 01/12/20 0805 01/12/20 1942 01/13/20 1210 01/13/20 1840 01/14/20 0234 01/14/20 0545  WBC 4.3   < > 3.8* 8.7 5.7 5.3 5.0  HGB 11.4*   < > 9.0* 8.2* 8.4* 6.7* 6.9*  HCT 34.5*   < > 30.7* 26.1* 26.9* 22.0* 23.4*  PLT 169.0   < > 169 175 139* 126* 132*  MCV 88.9   < > 95.6 92.6 90.6 91.7 96.7  MCH  --   --  28.0 29.1 28.3 27.9 28.5  MCHC 33.0   < > 29.3* 31.4 31.2 30.5 29.5*  RDW 17.0*   < > 16.2* 16.3* 16.0* 16.2* 16.3*  LYMPHSABS 0.9  --   --   --   --   --  0.8    MONOABS 0.6  --   --   --   --   --  0.6  EOSABS 0.8*  --   --   --   --   --  0.4  BASOSABS 0.0  --   --   --   --   --  0.0   < > = values in this interval not displayed.    Recent Labs  Lab 01/09/20 1021 01/09/20 1118 01/12/20 1942 01/13/20 0733 01/14/20 0122 01/14/20 0545  NA 140 142 138 139  --  136  K 4.9 4.1 4.8 4.8  --  4.0  CL 101 99 102 103  --  99  CO2  --   --  23 25  --  23  GLUCOSE 95 92 105* 92  --  88  BUN 43* 39* 41* 52*  --  22  CREATININE 11.10* 10.90* 11.20* 12.04*  --  6.61*  CALCIUM  --   --  8.1* 7.8*  --  8.0*  AST  --   --  15 14*  --  14*  ALT  --   --  12 12  --  12  ALKPHOS  --   --  90 83  --  75  BILITOT  --   --  0.6 0.7  --  0.7  ALBUMIN  --   --  3.0* 2.7*  --  2.6*  MG  --   --   --  2.0  --  1.8  INR  --   --  1.3* 1.3*  --   --   TSH  --   --   --  0.351  --   --   BNP  --   --   --  122.4* 81.1  --     Recent Labs  Lab 01/12/20 2204 01/13/20 0733 01/14/20 0122  BNP  --  122.4* 81.1  SARSCOV2NAA NEGATIVE  --   --     ------------------------------------------------------------------------------------------------------------------ No results for input(s): CHOL, HDL, LDLCALC, TRIG, CHOLHDL, LDLDIRECT in the last 72 hours.  No results found for: HGBA1C ------------------------------------------------------------------------------------------------------------------ Recent Labs    01/13/20 0733  TSH 0.351   ------------------------------------------------------------------------------------------------------------------ No results for input(s): VITAMINB12, FOLATE, FERRITIN, TIBC, IRON, RETICCTPCT in the last 72 hours.  Coagulation profile Recent Labs  Lab 01/12/20 1942 01/13/20 0733  INR 1.3* 1.3*    No results for input(s): DDIMER in the last 72 hours.  Cardiac Enzymes No results for input(s): CKMB, TROPONINI, MYOGLOBIN in the last 168 hours.  Invalid input(s):  CK ------------------------------------------------------------------------------------------------------------------  Component Value Date/Time   BNP 81.1 01/14/2020 0122    Micro Results Recent Results (from the past 240 hour(s))  SARS CORONAVIRUS 2 (TAT 6-24 HRS) Nasopharyngeal Nasopharyngeal Swab     Status: None   Collection Time: 01/05/20  8:54 AM   Specimen: Nasopharyngeal Swab  Result Value Ref Range Status   SARS Coronavirus 2 NEGATIVE NEGATIVE Final    Comment: (NOTE) SARS-CoV-2 target nucleic acids are NOT DETECTED.  The SARS-CoV-2 RNA is generally detectable in upper and lower respiratory specimens during the acute phase of infection. Negative results do not preclude SARS-CoV-2 infection, do not rule out co-infections with other pathogens, and should not be used as the sole basis for treatment or other patient management decisions. Negative results must be combined with clinical observations, patient history, and epidemiological information. The expected result is Negative.  Fact Sheet for Patients: SugarRoll.be  Fact Sheet for Healthcare Providers: https://www.woods-mathews.com/  This test is not yet approved or cleared by the [place] FDA and  has been authorized for detection and/or diagnosis of SARS-CoV-2 by FDA under an Emergency Use Authorization (EUA). This EUA will remain  in effect (meaning this test can be used) for the duration of the COVID-19 declaration under Se ction 564(b)(1) of the Act, 21 U.S.C. section 360bbb-3(b)(1), unless the authorization is terminated or revoked sooner.  Performed at [facility], [place] [address] [place], [place] [address]   SARS Coronavirus 2 by RT PCR (hospital order, performed in [facility] hospital lab) Nasopharyngeal Nasopharyngeal Swab     Status: None   Collection Time: 01/12/20 10:04 PM   Specimen: Nasopharyngeal Swab  Result Value Ref Range Status   SARS  Coronavirus 2 NEGATIVE NEGATIVE Final    Comment: (NOTE) SARS-CoV-2 target nucleic acids are NOT DETECTED.  The SARS-CoV-2 RNA is generally detectable in upper and lower respiratory specimens during the acute phase of infection. The lowest concentration of SARS-CoV-2 viral copies this assay can detect is 250 copies / mL. A negative result does not preclude SARS-CoV-2 infection and should not be used as the sole basis for treatment or other patient management decisions.  A negative result may occur with improper specimen collection / handling, submission of specimen other than nasopharyngeal swab, presence of viral mutation(s) within the areas targeted by this assay, and inadequate number of viral copies (<250 copies / mL). A negative result must be combined with clinical observations, patient history, and epidemiological information.  Fact Sheet for Patients:   StrictlyIdeas.no  Fact Sheet for Healthcare Providers: BankingDealers.co.za  This test is not yet approved or  cleared by the [place] FDA and has been authorized for detection and/or diagnosis of SARS-CoV-2 by FDA under an Emergency Use Authorization (EUA).  This EUA will remain in effect (meaning this test can be used) for the duration of the COVID-19 declaration under Section 564(b)(1) of the Act, 21 U.S.C. section 360bbb-3(b)(1), unless the authorization is terminated or revoked sooner.  Performed at [facility], [place] [address], [place], [place] [address]   MRSA PCR Screening     Status: None   Collection Time: 01/13/20  4:44 AM   Specimen: Nasal Mucosa; Nasopharyngeal  Result Value Ref Range Status   MRSA by PCR NEGATIVE NEGATIVE Final    Comment:        The GeneXpert MRSA Assay (FDA approved for NASAL specimens only), is one component of a comprehensive MRSA colonization surveillance program. It is not intended to diagnose MRSA infection nor to  guide  or monitor treatment for MRSA infections. Performed at [facility], [place] [address] [place], [place] [address]     Radiology Reports No results found.  Time Spent in minutes  30   Lala Lund M.D on 01/14/2020 at 9:55 AM  To page go to [email or web address] - password [facility]

## 2020-01-14 NOTE — Progress Notes (Addendum)
[facility] Progress Note   Subjective:  Seen in room - getting another 1U PRBCs today - Hgb back down in 6's this morning. No CP, dyspnea, or sharp abdominal pains (just aching). Did fine with dialysis yesterday - 2L net UF. im Hungry- they wont let me eat  Objective Vitals:   01/14/20 0400 01/14/20 0500 01/14/20 0750 01/14/20 0805  BP: (!) 116/46 (!) 106/43 (!) 99/35 (!) 106/36  Pulse: 85 83 76   Resp: (!) 21 (!) 22 18   Temp: 98.5 F (36.9 C) 99.2 F (37.3 C) 98.7 F (37.1 C) 98.4 F (36.9 C)  TempSrc: Oral Oral Oral Oral  SpO2: (!) 86% (!) 88%    Weight:      Height:       Physical Exam General: Well appearing woman, NAD Heart: RRR; 2/6 murmur Lungs: CTAB Abdomen: soft, non-tender Extremities: No LE edema Dialysis Access: RUE AVF + bruit  Additional Objective Labs: Basic Metabolic Panel: Recent Labs  Lab 01/12/20 1942 01/13/20 0733 01/14/20 0545  NA 138 139 136  K 4.8 4.8 4.0  CL 102 103 99  CO2 23 25 23   GLUCOSE 105* 92 88  BUN 41* 52* 22  CREATININE 11.20* 12.04* 6.61*  CALCIUM 8.1* 7.8* 8.0*   Liver Function Tests: Recent Labs  Lab 01/12/20 1942 01/13/20 0733 01/14/20 0545  AST 15 14* 14*  ALT 12 12 12   ALKPHOS 90 83 75  BILITOT 0.6 0.7 0.7  PROT 6.0* 5.4* 5.2*  ALBUMIN 3.0* 2.7* 2.6*   CBC: Recent Labs  Lab 01/12/20 0805 01/12/20 0805 01/12/20 1942 01/12/20 1942 01/13/20 1210 01/13/20 1210 01/13/20 1840 01/14/20 0234 01/14/20 0545  WBC 4.3   < > 3.8*   < > 8.7   < > 5.7 5.3 5.0  NEUTROABS 2.0  --   --   --   --   --   --   --  3.3  HGB 11.4*   < > 9.0*   < > 8.2*   < > 8.4* 6.7* 6.9*  HCT 34.5*   < > 30.7*   < > 26.1*   < > 26.9* 22.0* 23.4*  MCV 88.9   < > 95.6  --  92.6  --  90.6 91.7 96.7  PLT 169.0   < > 169   < > 175   < > 139* 126* 132*   < > = values in this interval not displayed.   Medications: . pantoprozole (PROTONIX) infusion 8 mg/hr (01/14/20 0049)  . phytonadione (VITAMIN K) IV     . sodium  chloride   Intravenous Once  . carvedilol  6.25 mg Oral BID WC  . Chlorhexidine Gluconate Cloth  6 each Topical Q0600  . cinacalcet  60 mg Oral Q M,W,F  . darbepoetin (ARANESP) injection - DIALYSIS  40 mcg Intravenous Q Fri-HD  . doxercalciferol  3 mcg Intravenous Q M,W,F-HD  . furosemide  40 mg Intravenous Once  . [START ON 01/15/2020] isosorbide dinitrate  20 mg Oral TID  . sevelamer carbonate  1,600-2,400 mg Oral TID WC    Dialysis Orders: Center: [facility]  on MWF. 180NRE, 3hr 45 min, BFR 450, DFR 800, EDW 106kg, 2K/3.5Ca, UF Profile 4, AVF 15g  - Hectorol 3 mcg IV q HD - Heparin 2500 unit bolus - Midodrine 10mg  1 tab pre-HD and 1 tab mid treatment PRN - Home BMM meds: Sensipar 60mg  daily, Renvela 2 tabs TID with meals  Assessment/Plan: 1.  GI  bleed: EGD 6/21 with duodenal polyp removal, repeat EGD 6/25 showed active bleeding at prior polypectomy site -> clipped/coagulated. S/p 2U PRBCs + FFP on admit, another 1U PRBCs today. GI following. 2.  ESRD:  Continue HD per MWF schedule - next 6/28. No heparin. 3.  Hypertension/volume: BP soft on admission, now stable. Patient does take midodrine predialysis as an outpatient.   4.  Anemia: Worsening anemia d/t #1. Tranfusing now. ESA given 6/25 - follow. 5.  Metabolic bone disease: Corrected calcium at goal. Continue hectorol, sensipar and renvela.  6.  Nutrition:  Will need renal diet/ fluid restrictions once tolerating PO 7. A-fib: Warfarin on hold - s/p FFP + Vit K to correct INR.  Veneta Penton, PA-C 01/14/2020, 10:15 AM  [facility]  Patient seen and examined, agree with above note with above modifications. Frustrated, tired, hungry.  No issues with HD yest-  req more blood today.  Plan is serial CBCs-  Next HD not due til Monday 6/28 Corliss Parish, MD 01/14/2020

## 2020-01-14 NOTE — Progress Notes (Signed)
CRITICAL VALUE ALERT  Critical Value:  6.7 hgb  Date & Time Notied:  01/14/20 0250   Provider Notified: Marcello Moores  Orders Received/Actions taken: Instructed to order repeat CBC on patient.

## 2020-01-15 ENCOUNTER — Encounter (HOSPITAL_COMMUNITY): Payer: Self-pay | Admitting: Internal Medicine

## 2020-01-15 LAB — CBC WITH DIFFERENTIAL/PLATELET
Abs Immature Granulocytes: 0.02 10*3/uL (ref 0.00–0.07)
Basophils Absolute: 0 10*3/uL (ref 0.0–0.1)
Basophils Relative: 0 %
Eosinophils Absolute: 0.7 10*3/uL — ABNORMAL HIGH (ref 0.0–0.5)
Eosinophils Relative: 12 %
HCT: 27.1 % — ABNORMAL LOW (ref 36.0–46.0)
Hemoglobin: 8.5 g/dL — ABNORMAL LOW (ref 12.0–15.0)
Immature Granulocytes: 0 %
Lymphocytes Relative: 28 %
Lymphs Abs: 1.7 10*3/uL (ref 0.7–4.0)
MCH: 28.2 pg (ref 26.0–34.0)
MCHC: 31.4 g/dL (ref 30.0–36.0)
MCV: 90 fL (ref 80.0–100.0)
Monocytes Absolute: 0.8 10*3/uL (ref 0.1–1.0)
Monocytes Relative: 13 %
Neutro Abs: 2.8 10*3/uL (ref 1.7–7.7)
Neutrophils Relative %: 47 %
Platelets: 139 10*3/uL — ABNORMAL LOW (ref 150–400)
RBC: 3.01 MIL/uL — ABNORMAL LOW (ref 3.87–5.11)
RDW: 16.4 % — ABNORMAL HIGH (ref 11.5–15.5)
WBC: 6 10*3/uL (ref 4.0–10.5)
nRBC: 0 % (ref 0.0–0.2)

## 2020-01-15 LAB — COMPREHENSIVE METABOLIC PANEL
ALT: 11 U/L (ref 0–44)
AST: 13 U/L — ABNORMAL LOW (ref 15–41)
Albumin: 2.8 g/dL — ABNORMAL LOW (ref 3.5–5.0)
Alkaline Phosphatase: 75 U/L (ref 38–126)
Anion gap: 10 (ref 5–15)
BUN: 30 mg/dL — ABNORMAL HIGH (ref 8–23)
CO2: 26 mmol/L (ref 22–32)
Calcium: 8.1 mg/dL — ABNORMAL LOW (ref 8.9–10.3)
Chloride: 99 mmol/L (ref 98–111)
Creatinine, Ser: 8.53 mg/dL — ABNORMAL HIGH (ref 0.44–1.00)
GFR calc Af Amer: 5 mL/min — ABNORMAL LOW (ref >60)
GFR calc non Af Amer: 4 mL/min — ABNORMAL LOW (ref >60)
Glucose, Bld: 86 mg/dL (ref 70–99)
Potassium: 3.8 mmol/L (ref 3.5–5.1)
Sodium: 135 mmol/L (ref 135–145)
Total Bilirubin: 0.6 mg/dL (ref 0.3–1.2)
Total Protein: 5.7 g/dL — ABNORMAL LOW (ref 6.5–8.1)

## 2020-01-15 LAB — BPAM RBC
Blood Product Expiration Date: 202107262359
Blood Product Expiration Date: 202107262359
Blood Product Expiration Date: 202107292359
ISSUE DATE / TIME: 202106242151
ISSUE DATE / TIME: 202106260740
ISSUE DATE / TIME: 202106261211
Unit Type and Rh: 5100
Unit Type and Rh: 5100
Unit Type and Rh: 5100

## 2020-01-15 LAB — TYPE AND SCREEN
ABO/RH(D): O POS
Antibody Screen: NEGATIVE
Unit division: 0
Unit division: 0
Unit division: 0

## 2020-01-15 LAB — CBC
HCT: 28.3 % — ABNORMAL LOW (ref 36.0–46.0)
Hemoglobin: 8.9 g/dL — ABNORMAL LOW (ref 12.0–15.0)
MCH: 28.4 pg (ref 26.0–34.0)
MCHC: 31.4 g/dL (ref 30.0–36.0)
MCV: 90.4 fL (ref 80.0–100.0)
Platelets: 148 10*3/uL — ABNORMAL LOW (ref 150–400)
RBC: 3.13 MIL/uL — ABNORMAL LOW (ref 3.87–5.11)
RDW: 16.3 % — ABNORMAL HIGH (ref 11.5–15.5)
WBC: 5 10*3/uL (ref 4.0–10.5)
nRBC: 0 % (ref 0.0–0.2)

## 2020-01-15 LAB — BRAIN NATRIURETIC PEPTIDE: B Natriuretic Peptide: 81.6 pg/mL (ref 0.0–100.0)

## 2020-01-15 LAB — MAGNESIUM: Magnesium: 1.8 mg/dL (ref 1.7–2.4)

## 2020-01-15 MED ORDER — PANTOPRAZOLE SODIUM 40 MG PO TBEC
40.0000 mg | DELAYED_RELEASE_TABLET | Freq: Every day | ORAL | Status: DC
  Administered 2020-01-16 – 2020-01-19 (×3): 40 mg via ORAL
  Filled 2020-01-15 (×3): qty 1

## 2020-01-15 MED ORDER — CHLORHEXIDINE GLUCONATE CLOTH 2 % EX PADS
6.0000 | MEDICATED_PAD | Freq: Every day | CUTANEOUS | Status: DC
  Administered 2020-01-16 – 2020-01-19 (×4): 6 via TOPICAL

## 2020-01-15 MED ORDER — ALUM & MAG HYDROXIDE-SIMETH 200-200-20 MG/5ML PO SUSP: 30.0000 mL | Freq: Once | ORAL | Status: DC

## 2020-01-15 MED ORDER — ISOSORBIDE DINITRATE 20 MG PO TABS
20.0000 mg | ORAL_TABLET | Freq: Two times a day (BID) | ORAL | Status: DC
  Administered 2020-01-16 – 2020-01-19 (×5): 20 mg via ORAL
  Filled 2020-01-15 (×8): qty 1

## 2020-01-15 MED ORDER — CARVEDILOL 3.125 MG PO TABS
3.1250 mg | ORAL_TABLET | Freq: Two times a day (BID) | ORAL | Status: DC
  Administered 2020-01-15 – 2020-01-19 (×8): 3.125 mg via ORAL
  Filled 2020-01-15 (×8): qty 1

## 2020-01-15 NOTE — Progress Notes (Signed)
Progress Note for Jane [name]  Subjective: No complaints.  Feelling well.  Objective: Vital signs in last 24 hours: Temp:  [98 F (36.7 C)-98.6 F (37 C)] 98.1 F (36.7 C) (06/27 0453) Pulse Rate:  [70-88] 80 (06/27 0453) Resp:  [10-19] 19 (06/27 0032) BP: (98-137)/(37-51) 137/51 (06/27 0803) SpO2:  [94 %-98 %] 94 % (06/27 0453) Last BM Date: 01/14/20  Intake/Output from previous day: No intake/output data recorded. Intake/Output this shift: No intake/output data recorded.  General appearance: alert and no distress [name]: soft, non-tender; bowel sounds normal; no masses,  no organomegaly  Lab Results: Recent Labs    01/14/20 1852 01/14/20 2049 01/15/20 0110  WBC 5.7 5.9 6.0  HGB 8.4* 8.3* 8.5*  HCT 26.9* 26.5* 27.1*  PLT 130* 135* 139*   BMET Recent Labs    01/13/20 0733 01/14/20 0545 01/15/20 0110  NA 139 136 135  K 4.8 4.0 3.8  CL 103 99 99  CO2 25 23 26   GLUCOSE 92 88 86  BUN 52* 22 30*  CREATININE 12.04* 6.61* 8.53*  CALCIUM 7.8* 8.0* 8.1*   LFT Recent Labs    01/15/20 0110  PROT 5.7*  ALBUMIN 2.8*  AST 13*  ALT 11  ALKPHOS 75  BILITOT 0.6   PT/INR Recent Labs    01/12/20 1942 01/13/20 0733  LABPROT 15.4* 15.3*  INR 1.3* 1.3*   Hepatitis Panel No results for input(s): HEPBSAG, HCVAB, HEPAIGM, HEPBIGM in the last 72 hours. C-Diff No results for input(s): CDIFFTOX in the last 72 hours. Fecal Lactopherrin No results for input(s): FECLLACTOFRN in the last 72 hours.  Studies/Results: No results found.  Medications:  Scheduled: . sodium chloride   Intravenous Once  . carvedilol  3.125 mg Oral BID WC  . Chlorhexidine Gluconate Cloth  6 each Topical Q0600  . cinacalcet  60 mg Oral Q M,W,F  . darbepoetin (ARANESP) injection - DIALYSIS  40 mcg Intravenous Q Fri-HD  . doxercalciferol  3 mcg Intravenous Q M,W,F-HD  . [START ON 01/16/2020] isosorbide dinitrate  20 mg Oral BID  . sevelamer carbonate  1,600-2,400 mg Oral TID WC    Continuous: . pantoprozole (PROTONIX) infusion 8 mg/hr (01/15/20 0550)    Assessment/Plan: 1) Post polypectomy bleed from a duodenal polyp resection. 2) Anemia - stable.   Her HGB is stable and she feels well.  Plan: 1) Advance to a regular diet.  2) ? Home tomorrow.  LOS: 3 days   Vester Titsworth D 01/15/2020, 8:14 AM

## 2020-01-15 NOTE — Progress Notes (Signed)
PROGRESS NOTE                                                                                                                                                                                                             Patient Demographics:    Jane [name], is a 74 y.o. female, DOB - 1946/04/29, WJX:[id number]  Admit date - 01/12/2020   Admitting Physician Toy Baker, MD  Outpatient Primary MD for the patient is [name], [name], MD  LOS - 3  Chief Complaint  Patient presents with  . Rectal Bleeding  . Post-op Problem       Brief Narrative - 74 year old morbidly obese Caucasian female with history of duodenal polyp/adenoma, s/p EGD and a polyp removal on 01/09/2020, ESRD on MWF schedule, paroxysmal atrial fibrillation on Coumadin, OSA on CPAP at night, history of hemorrhoids, hypertension who presented to the hospital with multiple dark stools after she started her Coumadin on the day of admission.  She was found to be anemic and admitted to the hospital.   Subjective:   Patient in bed, appears comfortable, denies any headache, no fever, no chest pain or pressure, no shortness of breath , no abdominal pain. No focal weakness.  Bloody bowel movements in over 36 hours.   Assessment  & Plan :     1.  Acute blood loss anemia due to upper GI bleed.  With recent history of duodenal polyp removal with EGD on 01/09/2020, also suspicion for duodenal adenoma but recent EUS, she was recently started on Coumadin on the day of admission .  Received 2 unit of packed RBC on 01/13/2020 long with 1 unit of FFP and another 2 units of packed RBCs will be given on 01/14/2020, I think this is old bleeding, she is also seen by GI and underwent EGD showing bleeding site at the recent duodenal polyp removal, this was stabilized, I have also discussed the case with IR physician Dr. Pascal Lux on 01/14/2020.  H&H now remains stable and will be closely monitored, if there are signs of ongoing bleeding she  will undergo embolization.  The bleeding seems to have stabilized we will continue to monitor.  2.  Paroxysmal atrial fibrillation.  Mali vas 2 score of greater than 3.  Hold Coumadin, Anoro still 1.3 she received a unit of FFP on 01/13/2020 and 2 mg of vitamin K on 01/14/2020, resume Coreg at lower than home dose monitoring her blood pressure..  3.  ESRD.  On MWF schedule, renal  consulted.  4.  Essential hypertension - currently only on low-dose Coreg.  Transfuse and monitor.  5.  Morbid obesity with OSA.  BMI of 41, follow with PCP for weight loss, CPAP nightly.    Condition - Extremely Guarded  Family Communication  : Daughter [name] [phone]  - on 01/13/2020, 01/15/2020  Code Status :  Full  Consults  :  GI, Renal, IR  Procedures  :    EGD 01/13/20  - showing bleeding at duodenal polyp removal site.  PUD Prophylaxis : PPI  Disposition Plan  :    Status is: Inpatient  Remains inpatient appropriate because:IV treatments appropriate due to intensity of illness or inability to take PO   Dispo: The patient is from: Home              Anticipated d/c is to: Home              Anticipated d/c date is: > 3 days              Patient currently is not medically stable to d/c.  DVT Prophylaxis  :   SCDs   Lab Results  Component Value Date   PLT 148 (L) 01/15/2020   Lab Results  Component Value Date   INR 1.3 (H) 01/13/2020   INR 1.3 (H) 01/12/2020   INR 2.6 01/03/2020    Diet :  Diet Order            Diet regular Room service appropriate? No; Fluid consistency: Thin  Diet effective now                  Inpatient Medications Scheduled Meds: . sodium chloride   Intravenous Once  . carvedilol  3.125 mg Oral BID WC  . Chlorhexidine Gluconate Cloth  6 each Topical Q0600  . cinacalcet  60 mg Oral Q M,W,F  . darbepoetin (ARANESP) injection - DIALYSIS  40 mcg Intravenous Q Fri-HD  . doxercalciferol  3 mcg Intravenous Q M,W,F-HD  . [START ON 01/16/2020] isosorbide  dinitrate  20 mg Oral BID  . sevelamer carbonate  1,600-2,400 mg Oral TID WC   Continuous Infusions: . pantoprozole (PROTONIX) infusion 8 mg/hr (01/15/20 0550)   PRN Meds:.acetaminophen **OR** [DISCONTINUED] acetaminophen, albuterol, hydrALAZINE, [DISCONTINUED] ondansetron **OR** ondansetron (ZOFRAN) IV  Antibiotics  :   Anti-infectives (From admission, onward)   None          Objective:   Vitals:   01/14/20 2253 01/15/20 0032 01/15/20 0453 01/15/20 0803  BP: (!) 122/47 (!) 109/40 (!) 125/47 (!) 137/51  Pulse: 79 88 80   Resp: 12 19    Temp: 98.2 F (36.8 C) 98.6 F (37 C) 98.1 F (36.7 C)   TempSrc: Oral Axillary Axillary   SpO2: 97% 98% 94%   Weight:      Height:        SpO2: 94 % O2 Flow Rate (L/min): 2 L/min  Wt Readings from Last 3 Encounters:  01/13/20 104.6 kg  01/09/20 105.2 kg  12/01/19 107 kg    No intake or output data in the 24 hours ending 01/15/20 0948   Physical Exam  Awake Alert, No new F.N deficits, Normal affect [place].AT,PERRAL Supple Neck,No JVD, No cervical lymphadenopathy appriciated.  Symmetrical Chest wall movement, Good air movement bilaterally, CTAB RRR,No Gallops, Rubs or new Murmurs, No Parasternal Heave +ve B.Sounds, Abd Soft, No tenderness, No organomegaly appriciated, No rebound - guarding or rigidity. No Cyanosis, Clubbing or edema, No new Rash  or bruise      Data Review:    Recent Labs  Lab 01/12/20 0805 01/12/20 1942 01/14/20 0545 01/14/20 1852 01/14/20 2049 01/15/20 0110 01/15/20 0739  WBC 4.3   < > 5.0 5.7 5.9 6.0 5.0  HGB 11.4*   < > 6.9* 8.4* 8.3* 8.5* 8.9*  HCT 34.5*   < > 23.4* 26.9* 26.5* 27.1* 28.3*  PLT 169.0   < > 132* 130* 135* 139* 148*  MCV 88.9   < > 96.7 90.0 89.8 90.0 90.4  MCH  --    < > 28.5 28.1 28.1 28.2 28.4  MCHC 33.0   < > 29.5* 31.2 31.3 31.4 31.4  RDW 17.0*   < > 16.3* 16.5* 16.6* 16.4* 16.3*  LYMPHSABS 0.9  --  0.8  --   --  1.7  --   MONOABS 0.6  --  0.6  --   --  0.8  --   EOSABS  0.8*  --  0.4  --   --  0.7*  --   BASOSABS 0.0  --  0.0  --   --  0.0  --    < > = values in this interval not displayed.    Recent Labs  Lab 01/09/20 1118 01/12/20 1942 01/13/20 0733 01/14/20 0122 01/14/20 0545 01/15/20 0110  NA 142 138 139  --  136 135  K 4.1 4.8 4.8  --  4.0 3.8  CL 99 102 103  --  99 99  CO2  --  23 25  --  23 26  GLUCOSE 92 105* 92  --  88 86  BUN 39* 41* 52*  --  22 30*  CREATININE 10.90* 11.20* 12.04*  --  6.61* 8.53*  CALCIUM  --  8.1* 7.8*  --  8.0* 8.1*  AST  --  15 14*  --  14* 13*  ALT  --  12 12  --  12 11  ALKPHOS  --  90 83  --  75 75  BILITOT  --  0.6 0.7  --  0.7 0.6  ALBUMIN  --  3.0* 2.7*  --  2.6* 2.8*  MG  --   --  2.0  --  1.8 1.8  INR  --  1.3* 1.3*  --   --   --   TSH  --   --  0.351  --   --   --   BNP  --   --  122.4* 81.1  --  81.6    Recent Labs  Lab 01/12/20 2204 01/13/20 0733 01/14/20 0122 01/15/20 0110  BNP  --  122.4* 81.1 81.6  SARSCOV2NAA NEGATIVE  --   --   --     ------------------------------------------------------------------------------------------------------------------ No results for input(s): CHOL, HDL, LDLCALC, TRIG, CHOLHDL, LDLDIRECT in the last 72 hours.  No results found for: HGBA1C ------------------------------------------------------------------------------------------------------------------ Recent Labs    01/13/20 0733  TSH 0.351   ------------------------------------------------------------------------------------------------------------------ No results for input(s): VITAMINB12, FOLATE, FERRITIN, TIBC, IRON, RETICCTPCT in the last 72 hours.  Coagulation profile Recent Labs  Lab 01/12/20 1942 01/13/20 0733  INR 1.3* 1.3*    No results for input(s): DDIMER in the last 72 hours.  Cardiac Enzymes No results for input(s): CKMB, TROPONINI, MYOGLOBIN in the last 168 hours.  Invalid input(s):  CK ------------------------------------------------------------------------------------------------------------------    Component Value Date/Time   BNP 81.6 01/15/2020 0110    Micro Results Recent Results (from the past 240 hour(s))  SARS Coronavirus 2 by  RT PCR (hospital order, performed in [facility] hospital lab) Nasopharyngeal Nasopharyngeal Swab     Status: None   Collection Time: 01/12/20 10:04 PM   Specimen: Nasopharyngeal Swab  Result Value Ref Range Status   SARS Coronavirus 2 NEGATIVE NEGATIVE Final    Comment: (NOTE) SARS-CoV-2 target nucleic acids are NOT DETECTED.  The SARS-CoV-2 RNA is generally detectable in upper and lower respiratory specimens during the acute phase of infection. The lowest concentration of SARS-CoV-2 viral copies this assay can detect is 250 copies / mL. A negative result does not preclude SARS-CoV-2 infection and should not be used as the sole basis for treatment or other patient management decisions.  A negative result may occur with improper specimen collection / handling, submission of specimen other than nasopharyngeal swab, presence of viral mutation(s) within the areas targeted by this assay, and inadequate number of viral copies (<250 copies / mL). A negative result must be combined with clinical observations, patient history, and epidemiological information.  Fact Sheet for Patients:   StrictlyIdeas.no  Fact Sheet for Healthcare Providers: BankingDealers.co.za  This test is not yet approved or  cleared by the [place] FDA and has been authorized for detection and/or diagnosis of SARS-CoV-2 by FDA under an Emergency Use Authorization (EUA).  This EUA will remain in effect (meaning this test can be used) for the duration of the COVID-19 declaration under Section 564(b)(1) of the Act, 21 U.S.C. section 360bbb-3(b)(1), unless the authorization is terminated or revoked  sooner.  Performed at [facility], [place] [address] [place], [place] [address]   MRSA PCR Screening     Status: None   Collection Time: 01/13/20  4:44 AM   Specimen: Nasal Mucosa; Nasopharyngeal  Result Value Ref Range Status   MRSA by PCR NEGATIVE NEGATIVE Final    Comment:        The GeneXpert MRSA Assay (FDA approved for NASAL specimens only), is one component of a comprehensive MRSA colonization surveillance program. It is not intended to diagnose MRSA infection nor to guide or monitor treatment for MRSA infections. Performed at [facility], [place] [address], [place], [place] [address]     Radiology Reports No results found.  Time Spent in minutes  30   Lala Lund M.D on 01/15/2020 at 9:48 AM  To page go to [email or web address] - password [email or web address]

## 2020-01-15 NOTE — Progress Notes (Addendum)
[facility] Progress Note   Subjective:   Patient seen and examined in room.  Sitting in bedside chair.  Reports nausea this AM, now improved.  Ate breakfast this morning but now stomach upset, she believes it is due to not eating for several days. Otherwise feeling ok.  Denies SOB, CP, weakness, dizziness and fatigue. No BM. hgb pretty stable since blood given yesterday -  Thinks that she might be going home tomorrow ?   Objective Vitals:   01/14/20 2253 01/15/20 0032 01/15/20 0453 01/15/20 0803  BP: (!) 122/47 (!) 109/40 (!) 125/47 (!) 137/51  Pulse: 79 88 80   Resp: 12 19    Temp: 98.2 F (36.8 C) 98.6 F (37 C) 98.1 F (36.7 C)   TempSrc: Oral Axillary Axillary   SpO2: 97% 98% 94%   Weight:      Height:       Physical Exam General:well appearing female in NAD Heart:RRR, +0/9 systolic murmur Lungs:mostly CTAB, BS decreased. nml WOB Abdomen:soft, NTND Extremities:no LE edema Dialysis Access: RU AVF +b/t   Filed Weights   01/13/20 0420 01/13/20 1344 01/13/20 1748  Weight: 112.1 kg 107.5 kg 104.6 kg   No intake or output data in the 24 hours ending 01/15/20 0941  Additional Objective Labs: Basic Metabolic Panel: Recent Labs  Lab 01/13/20 0733 01/14/20 0545 01/15/20 0110  NA 139 136 135  K 4.8 4.0 3.8  CL 103 99 99  CO2 25 23 26   GLUCOSE 92 88 86  BUN 52* 22 30*  CREATININE 12.04* 6.61* 8.53*  CALCIUM 7.8* 8.0* 8.1*   Liver Function Tests: Recent Labs  Lab 01/13/20 0733 01/14/20 0545 01/15/20 0110  AST 14* 14* 13*  ALT 12 12 11   ALKPHOS 83 75 75  BILITOT 0.7 0.7 0.6  PROT 5.4* 5.2* 5.7*  ALBUMIN 2.7* 2.6* 2.8*   CBC: Recent Labs  Lab 01/12/20 0805 01/12/20 1942 01/14/20 0545 01/14/20 0545 01/14/20 1852 01/14/20 1852 01/14/20 2049 01/15/20 0110 01/15/20 0739  WBC 4.3   < > 5.0   < > 5.7   < > 5.9 6.0 5.0  NEUTROABS 2.0  --  3.3  --   --   --   --  2.8  --   HGB 11.4*   < > 6.9*   < > 8.4*   < > 8.3* 8.5* 8.9*  HCT 34.5*   <  > 23.4*   < > 26.9*   < > 26.5* 27.1* 28.3*  MCV 88.9   < > 96.7  --  90.0  --  89.8 90.0 90.4  PLT 169.0   < > 132*   < > 130*   < > 135* 139* 148*   < > = values in this interval not displayed.    Lab Results  Component Value Date   INR 1.3 (H) 01/13/2020   INR 1.3 (H) 01/12/2020   INR 2.6 01/03/2020   Studies/Results: No results found.  Medications:  pantoprozole (PROTONIX) infusion 8 mg/hr (01/15/20 0550)    sodium chloride   Intravenous Once   carvedilol  3.125 mg Oral BID WC   Chlorhexidine Gluconate Cloth  6 each Topical Q0600   cinacalcet  60 mg Oral Q M,W,F   darbepoetin (ARANESP) injection - DIALYSIS  40 mcg Intravenous Q Fri-HD   doxercalciferol  3 mcg Intravenous Q M,W,F-HD   [START ON 01/16/2020] isosorbide dinitrate  20 mg Oral BID   sevelamer carbonate  1,600-2,400 mg Oral TID WC  Dialysis Orders: Center[facility] MWF. 180NRE, 3hr 45 min, BFR 450, DFR 800, EDW 106kg, 2K/3.5Ca, UF Profile 4, AVF 15g  - Hectorol 3 mcg IV q HD - Heparin 2500 unit bolus - Midodrine 10mg  1 tab pre-HD and 1 tab mid treatment PRN - Home BMM meds: Sensipar 60mg  daily, Renvela 2 tabs TID with meals  Assessment/Plan: 1. GI bleed:EGD 6/21 with duodenal polyp removal, repeat EGD 6/25 showed active bleeding at prior polypectomy site -> clipped/coagulated. S/p 3U pRBCs + 2 units FFP during admission. GI following. 2. ESRD:Continue HD per MWF schedule - next 6/28. No heparin. 3. Hypertension/volume:BP soft on admission, now in goal. Patient does take midodrine predialysis as an outpatient - ordered. 4. Anemia:acute on chronic anemia d/t #1.  hgb 8.9 today. ESA given 6/25 - follow. 5. Metabolic bone disease:Corrected calcium at goal. Continue hectorol, sensipar and renvela.Will check phos.  6. Nutrition:Diet advanced by GI, changed to renal diet/ fluid restrictions.  7. A-fib: Warfarin on hold - s/p FFP + Vit K to correct INR.  Jen Mow, PA-C [facility] Pager: [phone] 01/15/2020,9:41 AM  LOS: 3 days    Patient seen and examined, agree with above note with above modifications. No new c/o's-  Finally got to eat ! From our standpoint is stable-  Due for routine HD tomorrow - no heparin.  Appropriate titration of HD related meds  Corliss Parish, MD 01/15/2020

## 2020-01-16 DIAGNOSIS — K264 Chronic or unspecified duodenal ulcer with hemorrhage: Secondary | ICD-10-CM

## 2020-01-16 LAB — CBC WITH DIFFERENTIAL/PLATELET
Abs Immature Granulocytes: 0.01 10*3/uL (ref 0.00–0.07)
Basophils Absolute: 0 10*3/uL (ref 0.0–0.1)
Basophils Relative: 0 %
Eosinophils Absolute: 0.7 10*3/uL — ABNORMAL HIGH (ref 0.0–0.5)
Eosinophils Relative: 11 %
HCT: 26.5 % — ABNORMAL LOW (ref 36.0–46.0)
Hemoglobin: 8.3 g/dL — ABNORMAL LOW (ref 12.0–15.0)
Immature Granulocytes: 0 %
Lymphocytes Relative: 22 %
Lymphs Abs: 1.3 10*3/uL (ref 0.7–4.0)
MCH: 28.2 pg (ref 26.0–34.0)
MCHC: 31.3 g/dL (ref 30.0–36.0)
MCV: 90.1 fL (ref 80.0–100.0)
Monocytes Absolute: 0.7 10*3/uL (ref 0.1–1.0)
Monocytes Relative: 12 %
Neutro Abs: 3.2 10*3/uL (ref 1.7–7.7)
Neutrophils Relative %: 55 %
Platelets: 165 10*3/uL (ref 150–400)
RBC: 2.94 MIL/uL — ABNORMAL LOW (ref 3.87–5.11)
RDW: 15.8 % — ABNORMAL HIGH (ref 11.5–15.5)
WBC: 5.8 10*3/uL (ref 4.0–10.5)
nRBC: 0 % (ref 0.0–0.2)

## 2020-01-16 LAB — COMPREHENSIVE METABOLIC PANEL
ALT: 12 U/L (ref 0–44)
AST: 12 U/L — ABNORMAL LOW (ref 15–41)
Albumin: 2.7 g/dL — ABNORMAL LOW (ref 3.5–5.0)
Alkaline Phosphatase: 82 U/L (ref 38–126)
Anion gap: 11 (ref 5–15)
BUN: 46 mg/dL — ABNORMAL HIGH (ref 8–23)
CO2: 24 mmol/L (ref 22–32)
Calcium: 8 mg/dL — ABNORMAL LOW (ref 8.9–10.3)
Chloride: 102 mmol/L (ref 98–111)
Creatinine, Ser: 11.44 mg/dL — ABNORMAL HIGH (ref 0.44–1.00)
GFR calc Af Amer: 3 mL/min — ABNORMAL LOW (ref >60)
GFR calc non Af Amer: 3 mL/min — ABNORMAL LOW (ref >60)
Glucose, Bld: 100 mg/dL — ABNORMAL HIGH (ref 70–99)
Potassium: 4.1 mmol/L (ref 3.5–5.1)
Sodium: 137 mmol/L (ref 135–145)
Total Bilirubin: 0.3 mg/dL (ref 0.3–1.2)
Total Protein: 5.6 g/dL — ABNORMAL LOW (ref 6.5–8.1)

## 2020-01-16 LAB — PROTIME-INR
INR: 1.3 — ABNORMAL HIGH (ref 0.8–1.2)
Prothrombin Time: 15.3 seconds — ABNORMAL HIGH (ref 11.4–15.2)

## 2020-01-16 LAB — MAGNESIUM: Magnesium: 2 mg/dL (ref 1.7–2.4)

## 2020-01-16 LAB — BRAIN NATRIURETIC PEPTIDE: B Natriuretic Peptide: 224.9 pg/mL — ABNORMAL HIGH (ref 0.0–100.0)

## 2020-01-16 MED ORDER — POLYETHYLENE GLYCOL 3350 17 G PO PACK
17.0000 g | PACK | Freq: Three times a day (TID) | ORAL | Status: DC
  Administered 2020-01-16 – 2020-01-18 (×6): 17 g via ORAL
  Filled 2020-01-16 (×7): qty 1

## 2020-01-16 MED ORDER — DOXERCALCIFEROL 4 MCG/2ML IV SOLN
INTRAVENOUS | Status: AC
  Filled 2020-01-16: qty 2

## 2020-01-16 MED ORDER — SENNA 8.6 MG PO TABS
1.0000 | ORAL_TABLET | Freq: Three times a day (TID) | ORAL | Status: DC
  Administered 2020-01-16 – 2020-01-19 (×7): 8.6 mg via ORAL
  Filled 2020-01-16 (×7): qty 1

## 2020-01-16 NOTE — Progress Notes (Signed)
PROGRESS NOTE                                                                                                                                                                                                             Patient Demographics:    Jane [name], is a 74 y.o. female, DOB - 1946/06/10, QIW:[id number]  Admit date - 01/12/2020   Admitting Physician Toy Baker, MD  Outpatient Primary MD for the patient is [name], [name], MD  LOS - 4  Chief Complaint  Patient presents with   Rectal Bleeding   Post-op Problem       Brief Narrative - 74 year old morbidly obese Caucasian female with history of duodenal polyp/adenoma, s/p EGD and a polyp removal on 01/09/2020, ESRD on MWF schedule, paroxysmal atrial fibrillation on Coumadin, OSA on CPAP at night, history of hemorrhoids, hypertension who presented to the hospital with multiple dark stools after she started her Coumadin on the day of admission.  She was found to be anemic and admitted to the hospital.   Subjective:   Patient in bed, appears comfortable, denies any headache, no fever, no chest pain or pressure, no shortness of breath , no abdominal pain. No focal weakness.   Assessment  & Plan :     1.  Acute blood loss anemia due to upper GI bleed.  With recent history of duodenal polyp removal with EGD on 01/09/2020, also suspicion for duodenal adenoma but recent EUS, she was recently started on Coumadin on the day of admission .  Received 2 unit of packed RBC on 01/13/2020 long with 1 unit of FFP and another 2 units of packed RBCs will be given on 01/14/2020, I think this is old bleeding, she is also seen by GI and underwent EGD showing bleeding site at the recent duodenal polyp removal, this was stabilized, I have also discussed the case with IR physician Dr. Pascal Lux on 01/14/2020.  H&H now remains stable and will be closely monitored, if there are signs of ongoing bleeding she will undergo embolization.  The bleeding  seems to have stabilized we will continue to monitor.  2.  Paroxysmal atrial fibrillation.  Mali vas 2 score of greater than 3.  Hold Coumadin, she received a unit of FFP on 01/13/2020 and 2 mg of vitamin K on 01/14/2020, resume Coreg at lower than home dose monitoring her blood pressure.  If no further bleeding will start Coumadin with caution on 01/18/2020 and monitor, will let INR gradually rise in  the setting of recent GI bleed.  3.  ESRD.  On MWF schedule, renal consulted.  4.  Essential hypertension - currently only on low-dose Coreg.  Transfuse and monitor.  5.  Morbid obesity with OSA.  BMI of 41, follow with PCP for weight loss, CPAP nightly.    Condition - Extremely Guarded  Family Communication  : Daughter [name] [phone]  - on 01/13/2020, 01/15/2020  Code Status :  Full  Consults  :  GI, Renal, IR  Procedures  :    EGD 01/13/20  - showing bleeding at duodenal polyp removal site.  PUD Prophylaxis : PPI  Disposition Plan  :    Status is: Inpatient  Remains inpatient appropriate because:IV treatments appropriate due to intensity of illness or inability to take PO   Dispo: The patient is from: Home              Anticipated d/c is to: Home              Anticipated d/c date is: > 3 days              Patient currently is not medically stable to d/c.  DVT Prophylaxis  :   SCDs   Lab Results  Component Value Date   PLT 165 01/16/2020   Lab Results  Component Value Date   INR 1.3 (H) 01/16/2020   INR 1.3 (H) 01/13/2020   INR 1.3 (H) 01/12/2020    Diet :  Diet Order            Diet renal with fluid restriction Fluid restriction: 1200 mL Fluid; Room service appropriate? Yes; Fluid consistency: Thin  Diet effective now                  Inpatient Medications Scheduled Meds:  alum & mag hydroxide-simeth  30 mL Oral Once   carvedilol  3.125 mg Oral BID WC   Chlorhexidine Gluconate Cloth  6 each Topical Q0600   Chlorhexidine Gluconate Cloth  6 each  Topical Q0600   cinacalcet  60 mg Oral Q M,W,F   darbepoetin (ARANESP) injection - DIALYSIS  40 mcg Intravenous Q Fri-HD   doxercalciferol  3 mcg Intravenous Q M,W,F-HD   isosorbide dinitrate  20 mg Oral BID   pantoprazole  40 mg Oral Daily   sevelamer carbonate  1,600-2,400 mg Oral TID WC   Continuous Infusions:  PRN Meds:.acetaminophen **OR** [DISCONTINUED] acetaminophen, albuterol, hydrALAZINE, [DISCONTINUED] ondansetron **OR** ondansetron (ZOFRAN) IV  Antibiotics  :   Anti-infectives (From admission, onward)   None          Objective:   Vitals:   01/15/20 2312 01/16/20 0421 01/16/20 0802 01/16/20 0803  BP: (!) 139/58 (!) 140/54 (!) 136/57   Pulse: 75 85 74   Resp: 19 16 18    Temp: 97.7 F (36.5 C) 98.7 F (37.1 C)  98.3 F (36.8 C)  TempSrc: Oral Oral  Oral  SpO2: 96% 96% 95%   Weight:      Height:        SpO2: 95 % O2 Flow Rate (L/min): 2 L/min  Wt Readings from Last 3 Encounters:  01/13/20 104.6 kg  01/09/20 105.2 kg  12/01/19 107 kg     Intake/Output Summary (Last 24 hours) at 01/16/2020 1007 Last data filed at 01/16/2020 0900 Gross per 24 hour  Intake 120 ml  Output 1 ml  Net 119 ml     Physical Exam  Awake Alert, No new F.N deficits,  Normal affect [place].AT,PERRAL Supple Neck,No JVD, No cervical lymphadenopathy appriciated.  Symmetrical Chest wall movement, Good air movement bilaterally, CTAB RRR,No Gallops, Rubs or new Murmurs, No Parasternal Heave +ve B.Sounds, Abd Soft, No tenderness, No organomegaly appriciated, No rebound - guarding or rigidity. No Cyanosis, Clubbing or edema, No new Rash or bruise    Data Review:    Recent Labs  Lab 01/12/20 0805 01/12/20 1942 01/14/20 0545 01/14/20 0545 01/14/20 1852 01/14/20 2049 01/15/20 0110 01/15/20 0739 01/16/20 0305  WBC 4.3   < > 5.0   < > 5.7 5.9 6.0 5.0 5.8  HGB 11.4*   < > 6.9*   < > 8.4* 8.3* 8.5* 8.9* 8.3*  HCT 34.5*   < > 23.4*   < > 26.9* 26.5* 27.1* 28.3* 26.5*  PLT  169.0   < > 132*   < > 130* 135* 139* 148* 165  MCV 88.9   < > 96.7   < > 90.0 89.8 90.0 90.4 90.1  MCH  --    < > 28.5   < > 28.1 28.1 28.2 28.4 28.2  MCHC 33.0   < > 29.5*   < > 31.2 31.3 31.4 31.4 31.3  RDW 17.0*   < > 16.3*   < > 16.5* 16.6* 16.4* 16.3* 15.8*  LYMPHSABS 0.9  --  0.8  --   --   --  1.7  --  1.3  MONOABS 0.6  --  0.6  --   --   --  0.8  --  0.7  EOSABS 0.8*  --  0.4  --   --   --  0.7*  --  0.7*  BASOSABS 0.0  --  0.0  --   --   --  0.0  --  0.0   < > = values in this interval not displayed.    Recent Labs  Lab 01/12/20 1942 01/13/20 0733 01/14/20 0122 01/14/20 0545 01/15/20 0110 01/16/20 0305  NA 138 139  --  136 135 137  K 4.8 4.8  --  4.0 3.8 4.1  CL 102 103  --  99 99 102  CO2 23 25  --  23 26 24   GLUCOSE 105* 92  --  88 86 100*  BUN 41* 52*  --  22 30* 46*  CREATININE 11.20* 12.04*  --  6.61* 8.53* 11.44*  CALCIUM 8.1* 7.8*  --  8.0* 8.1* 8.0*  AST 15 14*  --  14* 13* 12*  ALT 12 12  --  12 11 12   ALKPHOS 90 83  --  75 75 82  BILITOT 0.6 0.7  --  0.7 0.6 0.3  ALBUMIN 3.0* 2.7*  --  2.6* 2.8* 2.7*  MG  --  2.0  --  1.8 1.8 2.0  INR 1.3* 1.3*  --   --   --  1.3*  TSH  --  0.351  --   --   --   --   BNP  --  122.4* 81.1  --  81.6 224.9*    Recent Labs  Lab 01/12/20 2204 01/13/20 0733 01/14/20 0122 01/15/20 0110 01/16/20 0305  BNP  --  122.4* 81.1 81.6 224.9*  SARSCOV2NAA NEGATIVE  --   --   --   --     ------------------------------------------------------------------------------------------------------------------ No results for input(s): CHOL, HDL, LDLCALC, TRIG, CHOLHDL, LDLDIRECT in the last 72 hours.  No results found for: HGBA1C ------------------------------------------------------------------------------------------------------------------ No results for input(s): TSH, T4TOTAL, T3FREE, THYROIDAB in the  last 72 hours.  Invalid input(s):  FREET3 ------------------------------------------------------------------------------------------------------------------ No results for input(s): VITAMINB12, FOLATE, FERRITIN, TIBC, IRON, RETICCTPCT in the last 72 hours.  Coagulation profile Recent Labs  Lab 01/12/20 1942 01/13/20 0733 01/16/20 0305  INR 1.3* 1.3* 1.3*    No results for input(s): DDIMER in the last 72 hours.  Cardiac Enzymes No results for input(s): CKMB, TROPONINI, MYOGLOBIN in the last 168 hours.  Invalid input(s): CK ------------------------------------------------------------------------------------------------------------------    Component Value Date/Time   BNP 224.9 (H) 01/16/2020 0305    Micro Results Recent Results (from the past 240 hour(s))  SARS Coronavirus 2 by RT PCR (hospital order, performed in [facility] hospital lab) Nasopharyngeal Nasopharyngeal Swab     Status: None   Collection Time: 01/12/20 10:04 PM   Specimen: Nasopharyngeal Swab  Result Value Ref Range Status   SARS Coronavirus 2 NEGATIVE NEGATIVE Final    Comment: (NOTE) SARS-CoV-2 target nucleic acids are NOT DETECTED.  The SARS-CoV-2 RNA is generally detectable in upper and lower respiratory specimens during the acute phase of infection. The lowest concentration of SARS-CoV-2 viral copies this assay can detect is 250 copies / mL. A negative result does not preclude SARS-CoV-2 infection and should not be used as the sole basis for treatment or other patient management decisions.  A negative result may occur with improper specimen collection / handling, submission of specimen other than nasopharyngeal swab, presence of viral mutation(s) within the areas targeted by this assay, and inadequate number of viral copies (<250 copies / mL). A negative result must be combined with clinical observations, patient history, and epidemiological information.  Fact Sheet for Patients:   StrictlyIdeas.no  Fact  Sheet for Healthcare Providers: BankingDealers.co.za  This test is not yet approved or  cleared by the [place] FDA and has been authorized for detection and/or diagnosis of SARS-CoV-2 by FDA under an Emergency Use Authorization (EUA).  This EUA will remain in effect (meaning this test can be used) for the duration of the COVID-19 declaration under Section 564(b)(1) of the Act, 21 U.S.C. section 360bbb-3(b)(1), unless the authorization is terminated or revoked sooner.  Performed at [facility], [place] [address], [place], [place] [address]   MRSA PCR Screening     Status: None   Collection Time: 01/13/20  4:44 AM   Specimen: Nasal Mucosa; Nasopharyngeal  Result Value Ref Range Status   MRSA by PCR NEGATIVE NEGATIVE Final    Comment:        The GeneXpert MRSA Assay (FDA approved for NASAL specimens only), is one component of a comprehensive MRSA colonization surveillance program. It is not intended to diagnose MRSA infection nor to guide or monitor treatment for MRSA infections. Performed at [facility], [place] [address] [place], [place] [address]     Radiology Reports No results found.  Time Spent in minutes  30   Lala Lund M.D on 01/16/2020 at 10:07 AM  To page go to [email or web address] - password [facility]

## 2020-01-16 NOTE — Progress Notes (Signed)
[facility] Progress Note   Subjective:    Seen in HD unit. Tolerating UF. Feels better today, no further bleeding, but constipated.   Objective Vitals:   01/16/20 1030 01/16/20 1100 01/16/20 1130 01/16/20 1200  BP: (!) 132/59 (!) 125/57 (!) 135/56 (!) 130/47  Pulse: 64 65 66 70  Resp: 18 20 (!) 21 19  Temp:      TempSrc:      SpO2:      Weight:      Height:       Physical Exam General:well appearing female in NAD Heart:RRR, +9/9 systolic murmur Lungs:mostly CTAB, BS decreased. nml WOB Abdomen:soft, NTND Extremities:no LE edema Dialysis Access: RU AVF +b/t   Filed Weights   01/13/20 1344 01/13/20 1748 01/16/20 1014  Weight: 107.5 kg 104.6 kg 108.5 kg    Intake/Output Summary (Last 24 hours) at 01/16/2020 1220 Last data filed at 01/16/2020 0900 Gross per 24 hour  Intake 120 ml  Output 1 ml  Net 119 ml    Additional Objective Labs: Basic Metabolic Panel: Recent Labs  Lab 01/14/20 0545 01/15/20 0110 01/16/20 0305  NA 136 135 137  K 4.0 3.8 4.1  CL 99 99 102  CO2 23 26 24   GLUCOSE 88 86 100*  BUN 22 30* 46*  CREATININE 6.61* 8.53* 11.44*  CALCIUM 8.0* 8.1* 8.0*   Liver Function Tests: Recent Labs  Lab 01/14/20 0545 01/15/20 0110 01/16/20 0305  AST 14* 13* 12*  ALT 12 11 12   ALKPHOS 75 75 82  BILITOT 0.7 0.6 0.3  PROT 5.2* 5.7* 5.6*  ALBUMIN 2.6* 2.8* 2.7*   CBC: Recent Labs  Lab 01/14/20 0545 01/14/20 0545 01/14/20 1852 01/14/20 1852 01/14/20 2049 01/14/20 2049 01/15/20 0110 01/15/20 0739 01/16/20 0305  WBC 5.0   < > 5.7   < > 5.9   < > 6.0 5.0 5.8  NEUTROABS 3.3  --   --   --   --   --  2.8  --  3.2  HGB 6.9*   < > 8.4*   < > 8.3*   < > 8.5* 8.9* 8.3*  HCT 23.4*   < > 26.9*   < > 26.5*   < > 27.1* 28.3* 26.5*  MCV 96.7   < > 90.0  --  89.8  --  90.0 90.4 90.1  PLT 132*   < > 130*   < > 135*   < > 139* 148* 165   < > = values in this interval not displayed.    Lab Results  Component Value Date   INR 1.3 (H)  01/16/2020   INR 1.3 (H) 01/13/2020   INR 1.3 (H) 01/12/2020   Studies/Results: No results found.  Medications:  . alum & mag hydroxide-simeth  30 mL Oral Once  . carvedilol  3.125 mg Oral BID WC  . Chlorhexidine Gluconate Cloth  6 each Topical Q0600  . Chlorhexidine Gluconate Cloth  6 each Topical Q0600  . cinacalcet  60 mg Oral Q M,W,F  . darbepoetin (ARANESP) injection - DIALYSIS  40 mcg Intravenous Q Fri-HD  . doxercalciferol      . doxercalciferol  3 mcg Intravenous Q M,W,F-HD  . isosorbide dinitrate  20 mg Oral BID  . pantoprazole  40 mg Oral Daily  . polyethylene glycol  17 g Oral TID  . senna  1 tablet Oral TID  . sevelamer carbonate  1,600-2,400 mg Oral TID WC    Dialysis Orders: Center:[facility]  MWF. 180NRE, 3hr 45 min, BFR 450, DFR 800, EDW 106kg, 2K/3.5Ca, UF Profile 4, AVF 15g  - Hectorol 3 mcg IV q HD - Heparin 2500 unit bolus - Midodrine 10mg  1 tab pre-HD and 1 tab mid treatment PRN - Home BMM meds: Sensipar 60mg  daily, Renvela 2 tabs TID with meals  Assessment/Plan: 1. GI bleed:EGD 6/21 with duodenal polyp removal, repeat EGD 6/25 showed active bleeding at prior polypectomy site -> clipped/coagulated. S/p 3U pRBCs + 2 units FFP during admission. GI following. 2. ESRD:Continue HD per MWF schedule - next 6/28. No heparin. 3. Hypertension/volume:BP soft on admission, now in goal. Patient does take midodrine predialysis as an outpatient - ordered. 4. Anemia:acute on chronic anemia d/t #1.  hgb 8.9>83. ESA given 6/25 - follow. 5. Metabolic bone disease:Corrected calcium at goal. Continue hectorol, sensipar and renvela.Will check phos.  6. Nutrition:Diet advanced by GI, changed to renal diet/ fluid restrictions.  7. A-fib: Warfarin on hold - s/p FFP + Vit K to correct INR.  Lynnda Child PA-C [facility] 01/16/2020,12:21 PM

## 2020-01-16 NOTE — Progress Notes (Signed)
          Daily Rounding Note  01/16/2020, 10:26 AM  LOS: 4 days   SUBJECTIVE:   Chief complaint: GI bleed following endoscopic mucosal resection adenomatous duodenal poly Constipation, chronic problem for her.  No BM since the day she arrived at the hospital last week.  No nausea, no abdominal pain. Original reason for going to see GI earlier this year was because of constipation.  She had to stop taking Linzess because insurance stopped paying for it and was started on Senokot and MiraLAX, tid.  Has not been receiving laxatives here at the hospital  OBJECTIVE:         Vital signs in last 24 hours:    Temp:  [97.7 F (36.5 C)-98.7 F (37.1 C)] 98.3 F (36.8 C) (06/28 0803) Pulse Rate:  [70-85] 74 (06/28 0802) Resp:  [15-19] 18 (06/28 0802) BP: (119-140)/(41-59) 136/57 (06/28 0802) SpO2:  [90 %-97 %] 95 % (06/28 0802) Last BM Date: 01/14/20 Filed Weights   01/13/20 0420 01/13/20 1344 01/13/20 1748  Weight: 112.1 kg 107.5 kg 104.6 kg   General: Looks well.  Comfortable.  Alert. Heart: RRR. Chest: No labored breathing or cough.  Lungs clear bilaterally Abdomen: Obese, soft, nontender.  Active bowel sounds. Extremities: No CCE.  AV fistula in right upper extremity Neuro/Psych: Alert.  Good historian.  Oriented x3.  Intake/Output from previous day: 06/27 0701 - 06/28 0700 In: -  Out: 1 [Urine:1]  Intake/Output this shift: Total I/O In: 120 [P.O.:120] Out: -   Lab Results: Recent Labs    01/15/20 0110 01/15/20 0739 01/16/20 0305  WBC 6.0 5.0 5.8  HGB 8.5* 8.9* 8.3*  HCT 27.1* 28.3* 26.5*  PLT 139* 148* 165   BMET Recent Labs    01/14/20 0545 01/15/20 0110 01/16/20 0305  NA 136 135 137  K 4.0 3.8 4.1  CL 99 99 102  CO2 23 26 24   GLUCOSE 88 86 100*  BUN 22 30* 46*  CREATININE 6.61* 8.53* 11.44*  CALCIUM 8.0* 8.1* 8.0*   LFT Recent Labs    01/14/20 0545 01/15/20 0110 01/16/20 0305  PROT 5.2* 5.7*  5.6*  ALBUMIN 2.6* 2.8* 2.7*  AST 14* 13* 12*  ALT 12 11 12   ALKPHOS 75 75 82  BILITOT 0.7 0.6 0.3   PT/INR Recent Labs    01/16/20 0305  LABPROT 15.3*  INR 1.3*   Hepatitis Panel No results for input(s): HEPBSAG, HCVAB, HEPAIGM, HEPBIGM in the last 72 hours.  Studies/Results: No results found.  ASSESMENT:   *   Melena prior to restarting Coumadin, but took dose 6/24 after bleeding had started.   S/p 01/09/20 EGD with EMR of duodenal adenomatous polyp, site clipped.  6/26 EGD with massive bleeding and pulsatile oozing at site of EMR resection, treated with epi, gold probe application achieving hemostasis.    *   Chronic Coumadin for PAF. Reversed w FFP and vit K 6/25, 6/26.   Plan to restart 6/30.    *   Anemia. S/p 3 PRBCs.  Hgb 6.7 >> 8.3.    *   ESRD.  HD MWF   PLAN   *   Rec IR intervention for/if recurrent bleeding.  *   Start coumadin w caution 6/30.    *   Restart home laxative regimen of 3 times daily Senokot and MiraLAX.    Azucena Freed  01/16/2020, 10:26 AM Phone [phone]

## 2020-01-16 NOTE — Progress Notes (Signed)
PT Cancellation Note  Patient Details Name: Jane [name] MRN: [id number] DOB: 10-28-1945   Cancelled Treatment:    Reason Eval/Treat Not Completed: Medical issues which prohibited therapy; checked on pt earlier and was in HD, now back but nauseated and waiting to get a meal tray, doesn't feel up to ambulation at this time.  Will attempt again another day.   Reginia Naas 01/16/2020, 3:14 PM  Magda Kiel, PT Acute Rehabilitation Services Pager:[phone] Office:[phone] 01/16/2020

## 2020-01-16 NOTE — TOC Transition Note (Signed)
Transition of Care [facility]) - CM/SW Discharge Note   Patient Details  Name: Jane [name] MRN: [id number] Date of Birth: [birth year]  [facility]) CM/SW Contact:  Pollie Friar, RN Phone Number: 01/16/2020, 3:58 PM   Clinical Narrative:    Pt lives home alone but states her daughter helps her at home. She provides meals, transportation and assists with her meds.  CM offered choice for [facility] and pt is refusing.  She states the only DME she needs is a tub bench but she will purchase that outside the hospital.  Pt has transportation home when medically ready.    Final next level of care: Home/Self Care Barriers to Discharge: Continued Medical Work up   Patient Goals and CMS Choice   CMS Medicare.gov Compare Post Acute Care list provided to:: Patient Choice offered to / list presented to : Patient  Discharge Placement                       Discharge Plan and Services                                     Social Determinants of Health (SDOH) Interventions     Readmission Risk Interventions No flowsheet data found.

## 2020-01-17 DIAGNOSIS — N186 End stage renal disease: Secondary | ICD-10-CM

## 2020-01-17 DIAGNOSIS — Z7901 Long term (current) use of anticoagulants: Secondary | ICD-10-CM

## 2020-01-17 DIAGNOSIS — Z992 Dependence on renal dialysis: Secondary | ICD-10-CM

## 2020-01-17 DIAGNOSIS — K922 Gastrointestinal hemorrhage, unspecified: Secondary | ICD-10-CM

## 2020-01-17 LAB — CBC WITH DIFFERENTIAL/PLATELET
Abs Immature Granulocytes: 0.01 10*3/uL (ref 0.00–0.07)
Basophils Absolute: 0 10*3/uL (ref 0.0–0.1)
Basophils Relative: 0 %
Eosinophils Absolute: 0.6 10*3/uL — ABNORMAL HIGH (ref 0.0–0.5)
Eosinophils Relative: 9 %
HCT: 27.3 % — ABNORMAL LOW (ref 36.0–46.0)
Hemoglobin: 8.5 g/dL — ABNORMAL LOW (ref 12.0–15.0)
Immature Granulocytes: 0 %
Lymphocytes Relative: 15 %
Lymphs Abs: 0.9 10*3/uL (ref 0.7–4.0)
MCH: 28.2 pg (ref 26.0–34.0)
MCHC: 31.1 g/dL (ref 30.0–36.0)
MCV: 90.7 fL (ref 80.0–100.0)
Monocytes Absolute: 0.7 10*3/uL (ref 0.1–1.0)
Monocytes Relative: 11 %
Neutro Abs: 3.9 10*3/uL (ref 1.7–7.7)
Neutrophils Relative %: 65 %
Platelets: 195 10*3/uL (ref 150–400)
RBC: 3.01 MIL/uL — ABNORMAL LOW (ref 3.87–5.11)
RDW: 15.8 % — ABNORMAL HIGH (ref 11.5–15.5)
WBC: 6.1 10*3/uL (ref 4.0–10.5)
nRBC: 0 % (ref 0.0–0.2)

## 2020-01-17 LAB — COMPREHENSIVE METABOLIC PANEL
ALT: 12 U/L (ref 0–44)
AST: 12 U/L — ABNORMAL LOW (ref 15–41)
Albumin: 2.8 g/dL — ABNORMAL LOW (ref 3.5–5.0)
Alkaline Phosphatase: 83 U/L (ref 38–126)
Anion gap: 10 (ref 5–15)
BUN: 16 mg/dL (ref 8–23)
CO2: 27 mmol/L (ref 22–32)
Calcium: 8.9 mg/dL (ref 8.9–10.3)
Chloride: 100 mmol/L (ref 98–111)
Creatinine, Ser: 6.36 mg/dL — ABNORMAL HIGH (ref 0.44–1.00)
GFR calc Af Amer: 7 mL/min — ABNORMAL LOW (ref >60)
GFR calc non Af Amer: 6 mL/min — ABNORMAL LOW (ref >60)
Glucose, Bld: 100 mg/dL — ABNORMAL HIGH (ref 70–99)
Potassium: 3.7 mmol/L (ref 3.5–5.1)
Sodium: 137 mmol/L (ref 135–145)
Total Bilirubin: 0.5 mg/dL (ref 0.3–1.2)
Total Protein: 5.6 g/dL — ABNORMAL LOW (ref 6.5–8.1)

## 2020-01-17 LAB — BRAIN NATRIURETIC PEPTIDE: B Natriuretic Peptide: 226 pg/mL — ABNORMAL HIGH (ref 0.0–100.0)

## 2020-01-17 LAB — PROTIME-INR
INR: 1.2 (ref 0.8–1.2)
Prothrombin Time: 14.7 seconds (ref 11.4–15.2)

## 2020-01-17 LAB — MAGNESIUM: Magnesium: 1.8 mg/dL (ref 1.7–2.4)

## 2020-01-17 MED ORDER — BISACODYL 10 MG RE SUPP
10.0000 mg | Freq: Once | RECTAL | Status: AC
  Administered 2020-01-17: 10 mg via RECTAL
  Filled 2020-01-17: qty 1

## 2020-01-17 MED ORDER — MAGNESIUM HYDROXIDE 400 MG/5ML PO SUSP
30.0000 mL | Freq: Two times a day (BID) | ORAL | Status: AC
  Administered 2020-01-17 (×2): 30 mL via ORAL
  Filled 2020-01-17 (×2): qty 30

## 2020-01-17 MED ORDER — WARFARIN SODIUM 5 MG PO TABS
5.0000 mg | ORAL_TABLET | Freq: Once | ORAL | Status: AC
  Administered 2020-01-18: 5 mg via ORAL
  Filled 2020-01-17 (×2): qty 1

## 2020-01-17 MED ORDER — FLUCONAZOLE 100 MG PO TABS
100.0000 mg | ORAL_TABLET | Freq: Once | ORAL | Status: AC
  Administered 2020-01-17: 100 mg via ORAL
  Filled 2020-01-17: qty 1

## 2020-01-17 MED ORDER — WARFARIN - PHARMACIST DOSING INPATIENT: Freq: Every day | Status: DC

## 2020-01-17 NOTE — Progress Notes (Signed)
PROGRESS NOTE                                                                                                                                                                                                             Patient Demographics:    Jane [name], is a 74 y.o. female, DOB - [birth year], HYW:[id number]  Admit date - 01/12/2020   Admitting Physician Toy Baker, MD  Outpatient Primary MD for the patient is [name], [name], MD  LOS - 5  Chief Complaint  Patient presents with  . Rectal Bleeding  . Post-op Problem       Brief Narrative - 74 year old morbidly obese Caucasian female with history of duodenal polyp/adenoma, s/p EGD and a polyp removal on 01/09/2020, ESRD on MWF schedule, paroxysmal atrial fibrillation on Coumadin, OSA on CPAP at night, history of hemorrhoids, hypertension who presented to the hospital with multiple dark stools after she started her Coumadin on the day of admission.  She was found to be anemic and admitted to the hospital.   Subjective:   Patient in bed, appears comfortable, denies any headache, no fever, no chest pain or pressure, no shortness of breath , no abdominal pain. No focal weakness.    Assessment  & Plan :     1.  Acute blood loss anemia due to upper GI bleed.  With recent history of duodenal polyp removal with EGD on 01/09/2020, also suspicion for duodenal adenoma but recent EUS, she was recently started on Coumadin on the day of admission .  Received 2 unit of packed RBC on 01/13/2020 long with 1 unit of FFP and another 2 units of packed RBCs will be given on 01/14/2020, I think this is old bleeding, she is also seen by GI and underwent EGD showing bleeding site at the recent duodenal polyp removal, this was stabilized, I have also discussed the case with IR physician Dr. Pascal Lux on 01/14/2020.  H&H now remains stable and will be closely monitored, if there are signs of ongoing bleeding she will undergo embolization.  The bleeding  seems to have stabilized we will continue to monitor.  2.  Paroxysmal atrial fibrillation.  Mali vas 2 score of greater than 3.  Hold Coumadin, she received a unit of FFP on 01/13/2020 and 2 mg of vitamin K on 01/14/2020, resume Coreg at lower than home dose monitoring her blood pressure.  If no further bleeding will start Coumadin with caution on 01/18/2020 and monitor H&H closely, will let INR  gradually rise in the setting of recent GI bleed.  3.  ESRD.  On MWF schedule, renal consulted.  4.  Essential hypertension - currently only on low-dose Coreg.  Transfuse and monitor.  5.  Morbid obesity with OSA.  BMI of 41, follow with PCP for weight loss, CPAP nightly.    Condition - Extremely Guarded  Family Communication  : Daughter [name] [phone]  - on 01/13/2020, 01/15/2020  Code Status :  Full  Consults  :  GI, Renal, IR  Procedures  :    EGD 01/13/20  - showing bleeding at duodenal polyp removal site.  PUD Prophylaxis : PPI  Disposition Plan  :    Status is: Inpatient  Remains inpatient appropriate because:IV treatments appropriate due to intensity of illness or inability to take PO   Dispo: The patient is from: Home              Anticipated d/c is to: Home              Anticipated d/c date is: > 3 days              Patient currently is not medically stable to d/c.  Needs resumption of Coumadin with close monitoring for reoccurrence of GI bleed.  DVT Prophylaxis  :   SCDs   Lab Results  Component Value Date   PLT 195 01/17/2020   Lab Results  Component Value Date   INR 1.2 01/17/2020   INR 1.3 (H) 01/16/2020   INR 1.3 (H) 01/13/2020    Diet :  Diet Order            Diet renal with fluid restriction Fluid restriction: 1200 mL Fluid; Room service appropriate? Yes; Fluid consistency: Thin  Diet effective now                  Inpatient Medications Scheduled Meds: . alum & mag hydroxide-simeth  30 mL Oral Once  . bisacodyl  10 mg Rectal Once  .  carvedilol  3.125 mg Oral BID WC  . Chlorhexidine Gluconate Cloth  6 each Topical Q0600  . Chlorhexidine Gluconate Cloth  6 each Topical Q0600  . cinacalcet  60 mg Oral Q M,W,F  . darbepoetin (ARANESP) injection - DIALYSIS  40 mcg Intravenous Q Fri-HD  . doxercalciferol  3 mcg Intravenous Q M,W,F-HD  . isosorbide dinitrate  20 mg Oral BID  . magnesium hydroxide  30 mL Oral BID  . pantoprazole  40 mg Oral Daily  . polyethylene glycol  17 g Oral TID  . senna  1 tablet Oral TID  . sevelamer carbonate  1,600-2,400 mg Oral TID WC  . [START ON 01/18/2020] warfarin  5 mg Oral ONCE-1600  . Warfarin - Pharmacist Dosing Inpatient   Does not apply q1600   Continuous Infusions:  PRN Meds:.acetaminophen **OR** [DISCONTINUED] acetaminophen, albuterol, hydrALAZINE, [DISCONTINUED] ondansetron **OR** ondansetron (ZOFRAN) IV  Antibiotics  :   Anti-infectives (From admission, onward)   Start     Dose/Rate Route Frequency Ordered Stop   01/17/20 0900  fluconazole (DIFLUCAN) tablet 100 mg        100 mg Oral  Once 01/17/20 0800 01/17/20 0845          Objective:   Vitals:   01/16/20 2033 01/16/20 2305 01/17/20 0325 01/17/20 0831  BP: (!) 149/59 (!) 113/52 (!) 128/52   Pulse: 82 74 81   Resp: 18 19 18    Temp: 98.1 F (36.7 C) 98.4 F (36.9  C) 98.4 F (36.9 C) 98.3 F (36.8 C)  TempSrc: Oral Oral Oral Oral  SpO2: 91% 95% 91%   Weight:   108.7 kg   Height:        SpO2: 91 % O2 Flow Rate (L/min): 2 L/min  Wt Readings from Last 3 Encounters:  01/17/20 108.7 kg  01/09/20 105.2 kg  12/01/19 107 kg     Intake/Output Summary (Last 24 hours) at 01/17/2020 1017 Last data filed at 01/16/2020 2209 Gross per 24 hour  Intake 120 ml  Output 2000 ml  Net -1880 ml     Physical Exam  Awake Alert, No new F.N deficits, Normal affect Delta.AT,PERRAL Supple Neck,No JVD, No cervical lymphadenopathy appriciated.  Symmetrical Chest wall movement, Good air movement bilaterally, CTAB RRR,No Gallops,  Rubs or new Murmurs, No Parasternal Heave +ve B.Sounds, Abd Soft, No tenderness, No organomegaly appriciated, No rebound - guarding or rigidity. No Cyanosis, Clubbing or edema, No new Rash or bruise    Data Review:    Recent Labs  Lab 01/12/20 0805 01/12/20 1942 01/14/20 0545 01/14/20 1852 01/14/20 2049 01/15/20 0110 01/15/20 0739 01/16/20 0305 01/17/20 0408  WBC 4.3   < > 5.0   < > 5.9 6.0 5.0 5.8 6.1  HGB 11.4*   < > 6.9*   < > 8.3* 8.5* 8.9* 8.3* 8.5*  HCT 34.5*   < > 23.4*   < > 26.5* 27.1* 28.3* 26.5* 27.3*  PLT 169.0   < > 132*   < > 135* 139* 148* 165 195  MCV 88.9   < > 96.7   < > 89.8 90.0 90.4 90.1 90.7  MCH  --    < > 28.5   < > 28.1 28.2 28.4 28.2 28.2  MCHC 33.0   < > 29.5*   < > 31.3 31.4 31.4 31.3 31.1  RDW 17.0*   < > 16.3*   < > 16.6* 16.4* 16.3* 15.8* 15.8*  LYMPHSABS 0.9  --  0.8  --   --  1.7  --  1.3 0.9  MONOABS 0.6  --  0.6  --   --  0.8  --  0.7 0.7  EOSABS 0.8*  --  0.4  --   --  0.7*  --  0.7* 0.6*  BASOSABS 0.0  --  0.0  --   --  0.0  --  0.0 0.0   < > = values in this interval not displayed.    Recent Labs  Lab 01/12/20 1942 01/12/20 1942 01/13/20 0733 01/14/20 0122 01/14/20 0545 01/15/20 0110 01/16/20 0305 01/17/20 0408  NA 138   < > 139  --  136 135 137 137  K 4.8   < > 4.8  --  4.0 3.8 4.1 3.7  CL 102   < > 103  --  99 99 102 100  CO2 23   < > 25  --  23 26 24 27   GLUCOSE 105*   < > 92  --  88 86 100* 100*  BUN 41*   < > 52*  --  22 30* 46* 16  CREATININE 11.20*   < > 12.04*  --  6.61* 8.53* 11.44* 6.36*  CALCIUM 8.1*   < > 7.8*  --  8.0* 8.1* 8.0* 8.9  AST 15   < > 14*  --  14* 13* 12* 12*  ALT 12   < > 12  --  12 11 12 12   ALKPHOS 90   < >  83  --  75 75 82 83  BILITOT 0.6   < > 0.7  --  0.7 0.6 0.3 0.5  ALBUMIN 3.0*   < > 2.7*  --  2.6* 2.8* 2.7* 2.8*  MG  --   --  2.0  --  1.8 1.8 2.0 1.8  INR 1.3*  --  1.3*  --   --   --  1.3* 1.2  TSH  --   --  0.351  --   --   --   --   --   BNP  --   --  122.4* 81.1  --  81.6 224.9*  226.0*   < > = values in this interval not displayed.    Recent Labs  Lab 01/12/20 2204 01/13/20 0733 01/14/20 0122 01/15/20 0110 01/16/20 0305 01/17/20 0408  BNP  --  122.4* 81.1 81.6 224.9* 226.0*  SARSCOV2NAA NEGATIVE  --   --   --   --   --     ------------------------------------------------------------------------------------------------------------------ No results for input(s): CHOL, HDL, LDLCALC, TRIG, CHOLHDL, LDLDIRECT in the last 72 hours.  No results found for: HGBA1C ------------------------------------------------------------------------------------------------------------------ No results for input(s): TSH, T4TOTAL, T3FREE, THYROIDAB in the last 72 hours.  Invalid input(s): FREET3 ------------------------------------------------------------------------------------------------------------------ No results for input(s): VITAMINB12, FOLATE, FERRITIN, TIBC, IRON, RETICCTPCT in the last 72 hours.  Coagulation profile Recent Labs  Lab 01/12/20 1942 01/13/20 0733 01/16/20 0305 01/17/20 0408  INR 1.3* 1.3* 1.3* 1.2    No results for input(s): DDIMER in the last 72 hours.  Cardiac Enzymes No results for input(s): CKMB, TROPONINI, MYOGLOBIN in the last 168 hours.  Invalid input(s): CK ------------------------------------------------------------------------------------------------------------------    Component Value Date/Time   BNP 226.0 (H) 01/17/2020 0408    Micro Results Recent Results (from the past 240 hour(s))  SARS Coronavirus 2 by RT PCR (hospital order, performed in [facility] hospital lab) Nasopharyngeal Nasopharyngeal Swab     Status: None   Collection Time: 01/12/20 10:04 PM   Specimen: Nasopharyngeal Swab  Result Value Ref Range Status   SARS Coronavirus 2 NEGATIVE NEGATIVE Final    Comment: (NOTE) SARS-CoV-2 target nucleic acids are NOT DETECTED.  The SARS-CoV-2 RNA is generally detectable in upper and lower respiratory specimens during  the acute phase of infection. The lowest concentration of SARS-CoV-2 viral copies this assay can detect is 250 copies / mL. A negative result does not preclude SARS-CoV-2 infection and should not be used as the sole basis for treatment or other patient management decisions.  A negative result may occur with improper specimen collection / handling, submission of specimen other than nasopharyngeal swab, presence of viral mutation(s) within the areas targeted by this assay, and inadequate number of viral copies (<250 copies / mL). A negative result must be combined with clinical observations, patient history, and epidemiological information.  Fact Sheet for Patients:   StrictlyIdeas.no  Fact Sheet for Healthcare Providers: BankingDealers.co.za  This test is not yet approved or  cleared by the [place] FDA and has been authorized for detection and/or diagnosis of SARS-CoV-2 by FDA under an Emergency Use Authorization (EUA).  This EUA will remain in effect (meaning this test can be used) for the duration of the COVID-19 declaration under Section 564(b)(1) of the Act, 21 U.S.C. section 360bbb-3(b)(1), unless the authorization is terminated or revoked sooner.  Performed at [facility], [place] [address] [place], [place] [address]   MRSA PCR Screening     Status: None   Collection Time:  01/13/20  4:44 AM   Specimen: Nasal Mucosa; Nasopharyngeal  Result Value Ref Range Status   MRSA by PCR NEGATIVE NEGATIVE Final    Comment:        The GeneXpert MRSA Assay (FDA approved for NASAL specimens only), is one component of a comprehensive MRSA colonization surveillance program. It is not intended to diagnose MRSA infection nor to guide or monitor treatment for MRSA infections. Performed at [facility], [place] [address] [place], [place] [address]     Radiology Reports No results found.  Time Spent in minutes   30   Lala Lund M.D on 01/17/2020 at 10:17 AM  To page go to [email or web address] - password [facility]

## 2020-01-17 NOTE — Progress Notes (Signed)
Physical Therapy Treatment Patient Details Name: Jane [name] MRN: [id number] DOB: 01/19/1946 Today's Date: 01/17/2020    History of Present Illness Pt is a 74 yo female presenting with multiple dark bloody stools following start of Coumadin. The pt is reccently s/p EGD and polyp removal 6/21, with PMH sig for duodenal polyp/adenoma, ESRD on HD MWF, afib on Coumadin, OSA on CPAP, HTN, and morbid obesity. PT received 2units PRBC 6/26.    PT Comments    Patient reports feeling tired all the time, but agreeable to mobilizing and walking in hallway. She did very well with RW. Patient lives alone and discussed importance of mobilizing after HD to assess her ability to continue to travel via [place] to/from dialysis. Will attempt to assess her safety with mobility after her HD on 6/20.     Follow Up Recommendations  Supervision/Assistance - 24 hour;No PT follow up (agree with recommended HHPT; pt refusing)     Equipment Recommendations  None recommended by PT (pt refusing 3n1 (previously recommended))    Recommendations for Other Services       Precautions / Restrictions Precautions Precautions: Fall    Mobility  Bed Mobility Overal bed mobility: Modified Independent             General bed mobility comments: able to transition sit to supine  Transfers Overall transfer level: Needs assistance Equipment used: Rolling walker (2 wheeled) Transfers: Sit to/from Stand Sit to Stand: Supervision         General transfer comment: for safety  Ambulation/Gait Ambulation/Gait assistance: Min guard Gait Distance (Feet): 200 Feet Assistive device: Rolling walker (2 wheeled) Gait Pattern/deviations: Step-to pattern;Decreased stride length;Antalgic;Wide base of support   Gait velocity interpretation: 1.31 - 2.62 ft/sec, indicative of limited community ambulator General Gait Details: obvious antalgic pattern (she reports baseline for her for years)   Proofreader Rankin (Stroke Patients Only)       Balance Overall balance assessment: Needs assistance Sitting-balance support: No upper extremity supported;Feet supported Sitting balance-Leahy Scale: Good     Standing balance support: During functional activity;No upper extremity supported Standing balance-Leahy Scale: Fair Standing balance comment: can statically stand without UE support                            Cognition Arousal/Alertness: Awake/alert Behavior During Therapy: WFL for tasks assessed/performed Overall Cognitive Status: Within Functional Limits for tasks assessed                                        Exercises      General Comments General comments (skin integrity, edema, etc.): on RA sats 97% throughout; HR 80s      Pertinent Vitals/Pain Pain Assessment: Faces Faces Pain Scale: Hurts little more Pain Location: bil knees and back Pain Descriptors / Indicators: Aching;Constant Pain Intervention(s): Limited activity within patient's tolerance;Monitored during session;Repositioned    Home Living                      Prior Function            PT Goals (current goals can now be found in the care plan section) Acute Rehab PT Goals Patient Stated Goal: to return home without need for continued PT Time For  Goal Achievement: 01/28/20 Potential to Achieve Goals: Fair Progress towards PT goals: Progressing toward goals    Frequency    Min 3X/week      PT Plan Discharge plan needs to be updated;Equipment recommendations need to be updated    Co-evaluation              AM-PAC PT "6 Clicks" Mobility   Outcome Measure  Help needed turning from your back to your side while in a flat bed without using bedrails?: None Help needed moving from lying on your back to sitting on the side of a flat bed without using bedrails?: None Help needed moving to and from a bed to a chair  (including a wheelchair)?: A Little Help needed standing up from a chair using your arms (e.g., wheelchair or bedside chair)?: None Help needed to walk in hospital room?: A Little Help needed climbing 3-5 steps with a railing? : A Lot 6 Click Score: 20    End of Session   Activity Tolerance: Patient tolerated treatment well Patient left: in bed;with call bell/phone within reach;with bed alarm set Nurse Communication: Mobility status PT Visit Diagnosis: Muscle weakness (generalized) (M62.81);Difficulty in walking, not elsewhere classified (R26.2)     Time: 3267-1245 PT Time Calculation (min) (ACUTE ONLY): 13 min  Charges:  $Gait Training: 8-22 mins                      Arby Barrette, PT Pager [phone]    Rexanne Mano 01/17/2020, 12:35 PM

## 2020-01-17 NOTE — Progress Notes (Signed)
[facility] Progress Note   Subjective:    Completed HD yesterday -2L UF. Seen in room, feels tired. No further bleeding, but remains constipated.   Objective Vitals:   01/16/20 2033 01/16/20 2305 01/17/20 0325 01/17/20 0831  BP: (!) 149/59 (!) 113/52 (!) 128/52   Pulse: 82 74 81   Resp: 18 19 18    Temp: 98.1 F (36.7 C) 98.4 F (36.9 C) 98.4 F (36.9 C) 98.3 F (36.8 C)  TempSrc: Oral Oral Oral Oral  SpO2: 91% 95% 91%   Weight:   108.7 kg   Height:       Physical Exam General:well appearing female in NAD Heart:RRR, +6/2 systolic murmur Lungs:mostly CTAB, BS decreased. nml WOB Abdomen:soft, NTND Extremities:no LE edema Dialysis Access: RU AVF +b/t   Filed Weights   01/13/20 1748 01/16/20 1014 01/17/20 0325  Weight: 104.6 kg 108.5 kg 108.7 kg    Intake/Output Summary (Last 24 hours) at 01/17/2020 1129 Last data filed at 01/16/2020 2209 Gross per 24 hour  Intake 120 ml  Output 2000 ml  Net -1880 ml    Additional Objective Labs: Basic Metabolic Panel: Recent Labs  Lab 01/15/20 0110 01/16/20 0305 01/17/20 0408  NA 135 137 137  K 3.8 4.1 3.7  CL 99 102 100  CO2 26 24 27   GLUCOSE 86 100* 100*  BUN 30* 46* 16  CREATININE 8.53* 11.44* 6.36*  CALCIUM 8.1* 8.0* 8.9   Liver Function Tests: Recent Labs  Lab 01/15/20 0110 01/16/20 0305 01/17/20 0408  AST 13* 12* 12*  ALT 11 12 12   ALKPHOS 75 82 83  BILITOT 0.6 0.3 0.5  PROT 5.7* 5.6* 5.6*  ALBUMIN 2.8* 2.7* 2.8*   CBC: Recent Labs  Lab 01/14/20 2049 01/14/20 2049 01/15/20 0110 01/15/20 0110 01/15/20 0739 01/16/20 0305 01/17/20 0408  WBC 5.9   < > 6.0   < > 5.0 5.8 6.1  NEUTROABS  --   --  2.8  --   --  3.2 3.9  HGB 8.3*   < > 8.5*   < > 8.9* 8.3* 8.5*  HCT 26.5*   < > 27.1*   < > 28.3* 26.5* 27.3*  MCV 89.8  --  90.0  --  90.4 90.1 90.7  PLT 135*   < > 139*   < > 148* 165 195   < > = values in this interval not displayed.    Lab Results  Component Value Date   INR 1.2  01/17/2020   INR 1.3 (H) 01/16/2020   INR 1.3 (H) 01/13/2020   Studies/Results: No results found.  Medications:  . alum & mag hydroxide-simeth  30 mL Oral Once  . bisacodyl  10 mg Rectal Once  . carvedilol  3.125 mg Oral BID WC  . Chlorhexidine Gluconate Cloth  6 each Topical Q0600  . Chlorhexidine Gluconate Cloth  6 each Topical Q0600  . cinacalcet  60 mg Oral Q M,W,F  . darbepoetin (ARANESP) injection - DIALYSIS  40 mcg Intravenous Q Fri-HD  . doxercalciferol  3 mcg Intravenous Q M,W,F-HD  . isosorbide dinitrate  20 mg Oral BID  . magnesium hydroxide  30 mL Oral BID  . pantoprazole  40 mg Oral Daily  . polyethylene glycol  17 g Oral TID  . senna  1 tablet Oral TID  . sevelamer carbonate  1,600-2,400 mg Oral TID WC  . [START ON 01/18/2020] warfarin  5 mg Oral ONCE-1600  . Warfarin - Pharmacist Dosing Inpatient  Does not apply q1600    Dialysis Orders: Center:[facility] MWF. 180NRE, 3hr 45 min, BFR 450, DFR 800, EDW 106kg, 2K/3.5Ca, UF Profile 4, AVF 15g  - Hectorol 3 mcg IV q HD - Heparin 2500 unit bolus - Midodrine 10mg  1 tab pre-HD and 1 tab mid treatment PRN - Home BMM meds: Sensipar 60mg  daily, Renvela 2 tabs TID with meals  Assessment/Plan: 1. GI bleed:EGD 6/21 with duodenal polyp removal, repeat EGD 6/25 showed active bleeding at prior polypectomy site -> clipped/coagulated. S/p 3U pRBCs + 2 units FFP during admission. No further bleeding. GI following. 2. ESRD:Continue HD per MWF schedule. No heparin. Next HD 6/30 3. Hypertension/volume:BP controlled.  Patient does take midodrine predialysis as an outpatient - ordered.UF to EDW as tolerated.  4. Anemia:acute on chronic anemia d/t #1.  hgb 8.5. ESA given 6/25 - follow. 5. Metabolic bone disease:Corrected calcium at goal. Continue hectorol, sensipar and renvela. 6. Nutrition:Diet advanced by GI, changed to renal diet/ fluid restrictions.  7. A-fib: Warfarin on hold d/t bleed. To resume warfarin 6/30 per  pharmacy.   Lynnda Child PA-C [facility] 01/17/2020,11:29 AM

## 2020-01-17 NOTE — Progress Notes (Addendum)
ANTICOAGULATION CONSULT NOTE - Initial Consult  Pharmacy Consult for coumadin  Indication: atrial fibrillation  Allergies  Allergen Reactions   Latex Rash   Penicillins     Yeast infection    Sulfa Antibiotics Rash   Tape Other (See Comments)    Plastic, silicone, and paper tape causes bruising and pulls off skin. Cloth tape works fine    Patient Measurements: Height: 5\' 5"  (165.1 cm) Weight: 108.7 kg (239 lb 10.2 oz) IBW/kg (Calculated) : 57 Heparin Dosing Weight:   Vital Signs: Temp: 98.4 F (36.9 C) (06/29 0325) Temp Source: Oral (06/29 0325) BP: 128/52 (06/29 0325) Pulse Rate: 81 (06/29 0325)  Labs: Recent Labs    01/15/20 0110 01/15/20 0110 01/15/20 0739 01/15/20 0739 01/16/20 0305 01/17/20 0408  HGB 8.5*   < > 8.9*   < > 8.3* 8.5*  HCT 27.1*   < > 28.3*  --  26.5* 27.3*  PLT 139*   < > 148*  --  165 195  LABPROT  --   --   --   --  15.3* 14.7  INR  --   --   --   --  1.3* 1.2  CREATININE 8.53*  --   --   --  11.44* 6.36*   < > = values in this interval not displayed.    Estimated Creatinine Clearance: 9.4 mL/min (A) (by C-G formula based on SCr of 6.36 mg/dL (H)).   Medical History: Past Medical History:  Diagnosis Date   Acid reflux    Anemia of chronic disease    Arthritis    Asthma    Atrial fibrillation ([place])    Bilateral carotid bruits    Complication of anesthesia    "hard to wake up, I have sleep apnea" no CPAP   Depression    Diverticulitis    Dysrhythmia    Afib   ESRD (end stage renal disease) ([place])    MWF [place]   Headache    History of blood transfusion    Hypertension    Malaise and fatigue    Orthostatic hypotension    Shortness of breath    " when I walk to fast"   Sleep apnea    Syncope    Tubulovillous adenoma of colon     Medications:  Medications Prior to Admission  Medication Sig Dispense Refill Last Dose   acetaminophen (TYLENOL) 500 MG tablet Take 1,500 mg by mouth daily as  needed for headache.   unk   albuterol (PROVENTIL HFA;VENTOLIN HFA) 108 (90 Base) MCG/ACT inhaler Inhale 1 puff into the lungs every 6 (six) hours as needed for wheezing or shortness of breath. (Patient taking differently: Inhale 2 puffs into the lungs every 6 (six) hours as needed for wheezing or shortness of breath. ) 6.7 g 2 unk   BESIVANCE 0.6 % SUSP Place 1 drop into the right eye 3 (three) times daily.   01/12/2020 at Unknown time   carvedilol (COREG) 25 MG tablet Take 25 mg by mouth 2 (two) times daily with a meal.   01/12/2020 at 1700   cinacalcet (SENSIPAR) 60 MG tablet Take 60 mg by mouth every Monday, Wednesday, and Friday.    Past Week at Unknown time   ethyl chloride spray Apply 1 application topically daily as needed (port access).   12 Past Week at Unknown time   hydrALAZINE (APRESOLINE) 25 MG tablet Take 1 tablet (25 mg total) by mouth 3 (three) times daily. 90 tablet 2  Past Week at Unknown time   isosorbide dinitrate (ISORDIL) 10 MG tablet Take 3 tablets (30 mg total) by mouth 3 (three) times daily. (Patient taking differently: Take 20 mg by mouth 3 (three) times daily. ) 90 tablet 2 Past Week at Unknown time   multivitamin (RENA-VIT) TABS tablet Take 1 tablet by mouth at bedtime.    01/12/2020 at Unknown time   nystatin cream (MYCOSTATIN) Apply 1 application topically 2 (two) times daily. (Patient taking differently: Apply 1 application topically 2 (two) times daily as needed (rash). ) 45 g 0 unk   omeprazole (PRILOSEC) 40 MG capsule Take 1 capsule (40 mg total) by mouth 2 (two) times daily before a meal. 60 capsule 2 01/12/2020 at Unknown time   polyethylene glycol (MIRALAX / GLYCOLAX) 17 g packet Take 17 g by mouth in the morning, at noon, and at bedtime.   Past Week at Unknown time   PROLENSA 0.07 % SOLN Place 1 drop into the right eye every evening.   01/12/2020 at Unknown time   senna (SENOKOT) 8.6 MG tablet Take 3 tablets by mouth 2 (two) times daily.   Past Week at  Unknown time   sevelamer carbonate (RENVELA) 800 MG tablet Take 1,600-2,400 mg by mouth See admin instructions. Take Take 1600 to 2400 mg with each meal   01/12/2020 at Unknown time   triamcinolone cream (KENALOG) 0.1 % Apply 1 application topically 2 (two) times daily. 45 g 0 unk   warfarin (COUMADIN) 5 MG tablet TAKE 1 & 1/2 (ONE & ONE-HALF) TABLETS BY MOUTH EVERY TUESDAY AND EVERY THURSDAY AND 1 TABLET ONCE DAILY ALL OTHER DAYS (Patient taking differently: Take 5-7.5 mg by mouth See admin instructions. Take 7.5 mg at night on Mon, Wed, and Fri. Take 5 mg at night on Sun, Tue, Thurs, and Sat) 113 tablet 0 01/12/2020 at 1700   Methoxy PEG-Epoetin Beta (MIRCERA IJ) IV with dialysis      Scheduled:   alum & mag hydroxide-simeth  30 mL Oral Once   carvedilol  3.125 mg Oral BID WC   Chlorhexidine Gluconate Cloth  6 each Topical Q0600   Chlorhexidine Gluconate Cloth  6 each Topical Q0600   cinacalcet  60 mg Oral Q M,W,F   darbepoetin (ARANESP) injection - DIALYSIS  40 mcg Intravenous Q Fri-HD   doxercalciferol  3 mcg Intravenous Q M,W,F-HD   fluconazole  100 mg Oral Once   isosorbide dinitrate  20 mg Oral BID   magnesium hydroxide  30 mL Oral BID   pantoprazole  40 mg Oral Daily   polyethylene glycol  17 g Oral TID   senna  1 tablet Oral TID   sevelamer carbonate  1,600-2,400 mg Oral TID WC    Assessment: Pt was admitted for GIB after a recent procedure for duodenal polyp removal. She has received several PRBC. Hgb has been stable around 8.5. Plan is to resume her coumadin tomorrow with less aggressive dosing. She is getting fluconazole x 1 dose.  INR 1.2 S/p 2mg  vit k on 6/26   Goal of Therapy:  INR 2-3 Monitor platelets by anticoagulation protocol: Yes   Plan:  Coumadin 5mg  PO x1 6/30 Daily INR  Onnie Boer, PharmD, [place], AAHIVP, CPP Infectious Disease Pharmacist 01/17/2020 8:38 AM

## 2020-01-17 NOTE — Progress Notes (Addendum)
Daily Rounding Note  01/17/2020, 9:43 AM  LOS: 5 days   SUBJECTIVE:   Chief complaint:  GIB following duodenal polyp EMR.   Still no signif BM, smear of stool looks dark, like old blood Received Miralax, senekot and, this AM,  MOM. Feels tired not dizzy, attributes it to having been in bed for several days.  OBJECTIVE:         Vital signs in last 24 hours:    Temp:  [97.6 F (36.4 C)-98.4 F (36.9 C)] 98.3 F (36.8 C) (06/29 0831) Pulse Rate:  [64-88] 81 (06/29 0325) Resp:  [14-21] 18 (06/29 0325) BP: (113-155)/(44-71) 128/52 (06/29 0325) SpO2:  [91 %-98 %] 91 % (06/29 0325) Weight:  [108.5 kg-108.7 kg] 108.7 kg (06/29 0325) Last BM Date: 01/14/20 Filed Weights   01/13/20 1748 01/16/20 1014 01/17/20 0325  Weight: 104.6 kg 108.5 kg 108.7 kg   General: looks well   Heart: RRR, NSR in 70s on tele Chest: dimished but clear Abdomen: soft, NT, ND.  Active BS  Extremities: no CCE Neuro/Psych:  Oriented x 3.  No gross deficits.  Fully alert.    Intake/Output from previous day: 06/28 0701 - 06/29 0700 In: 240 [P.O.:240] Out: 2000   Intake/Output this shift: No intake/output data recorded.  Lab Results: Recent Labs    01/15/20 0739 01/16/20 0305 01/17/20 0408  WBC 5.0 5.8 6.1  HGB 8.9* 8.3* 8.5*  HCT 28.3* 26.5* 27.3*  PLT 148* 165 195   BMET Recent Labs    01/15/20 0110 01/16/20 0305 01/17/20 0408  NA 135 137 137  K 3.8 4.1 3.7  CL 99 102 100  CO2 26 24 27   GLUCOSE 86 100* 100*  BUN 30* 46* 16  CREATININE 8.53* 11.44* 6.36*  CALCIUM 8.1* 8.0* 8.9   LFT Recent Labs    01/15/20 0110 01/16/20 0305 01/17/20 0408  PROT 5.7* 5.6* 5.6*  ALBUMIN 2.8* 2.7* 2.8*  AST 13* 12* 12*  ALT 11 12 12   ALKPHOS 75 82 83  BILITOT 0.6 0.3 0.5   PT/INR Recent Labs    01/16/20 0305 01/17/20 0408  LABPROT 15.3* 14.7  INR 1.3* 1.2   Scheduled Meds: . alum & mag hydroxide-simeth  30 mL Oral Once  .  bisacodyl  10 mg Rectal Once  . carvedilol  3.125 mg Oral BID WC  . Chlorhexidine Gluconate Cloth  6 each Topical Q0600  . Chlorhexidine Gluconate Cloth  6 each Topical Q0600  . cinacalcet  60 mg Oral Q M,W,F  . darbepoetin (ARANESP) injection - DIALYSIS  40 mcg Intravenous Q Fri-HD  . doxercalciferol  3 mcg Intravenous Q M,W,F-HD  . isosorbide dinitrate  20 mg Oral BID  . magnesium hydroxide  30 mL Oral BID  . pantoprazole  40 mg Oral Daily  . polyethylene glycol  17 g Oral TID  . senna  1 tablet Oral TID  . sevelamer carbonate  1,600-2,400 mg Oral TID WC  . [START ON 01/18/2020] warfarin  5 mg Oral ONCE-1600  . Warfarin - Pharmacist Dosing Inpatient   Does not apply q1600   Continuous Infusions: PRN Meds:.acetaminophen **OR** [DISCONTINUED] acetaminophen, albuterol, hydrALAZINE, [DISCONTINUED] ondansetron **OR** ondansetron (ZOFRAN) IV   ASSESMENT:   *   Melena prior to restarting Coumadin, but took dose 6/24 after bleeding had started.   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 Gastric HP polyp removed.  6/26 EGD with massive bleeding and pulsatile oozing at site of EMR resection, treated with epi, gold probe application achieving hemostasis.   No further BM's since day of admission  *   Chronic constipation.  *   Chronic Coumadin for PAF.  INR 1.3 at admission.  Reversed w FFP and vit K 6/25, 6/26.   Plan to restart 6/30.    *   Anemia. S/p 3 PRBCs.  Hgb 6.7 >> 8.5.    *   ESRD.  HD MWF    PLAN   *   Dulcolax suppository now.    *   Restarting Coumadin 6/30.    *   GI signing off.  GI fup will be arranged.      Azucena Freed  01/17/2020, 9:43 AM Phone [phone]   Attending physician's note   I have taken an interval history, reviewed the chart and examined the patient. I agree with the Advanced Practitioner's note, impression and recommendations.   Hemoglobin remains stable s/p repeat EGD  6/26 with BiCAP of visible vessel with active bleeding at the site of EMR  Complaints of constipation, somewhat improved with suppository and bowel regimen, continue  Okay to restart Coumadin  Available if have any questions, will arrange for follow-up in GI office  K. Denzil Magnuson , MD [phone]

## 2020-01-18 DIAGNOSIS — I480 Paroxysmal atrial fibrillation: Secondary | ICD-10-CM | POA: Diagnosis not present

## 2020-01-18 DIAGNOSIS — I10 Essential (primary) hypertension: Secondary | ICD-10-CM | POA: Diagnosis not present

## 2020-01-18 LAB — CBC WITH DIFFERENTIAL/PLATELET
Abs Immature Granulocytes: 0.01 10*3/uL (ref 0.00–0.07)
Basophils Absolute: 0 10*3/uL (ref 0.0–0.1)
Basophils Relative: 0 %
Eosinophils Absolute: 0.8 10*3/uL — ABNORMAL HIGH (ref 0.0–0.5)
Eosinophils Relative: 11 %
HCT: 28 % — ABNORMAL LOW (ref 36.0–46.0)
Hemoglobin: 8.7 g/dL — ABNORMAL LOW (ref 12.0–15.0)
Immature Granulocytes: 0 %
Lymphocytes Relative: 16 %
Lymphs Abs: 1.1 10*3/uL (ref 0.7–4.0)
MCH: 28.2 pg (ref 26.0–34.0)
MCHC: 31.1 g/dL (ref 30.0–36.0)
MCV: 90.9 fL (ref 80.0–100.0)
Monocytes Absolute: 0.7 10*3/uL (ref 0.1–1.0)
Monocytes Relative: 10 %
Neutro Abs: 4.5 10*3/uL (ref 1.7–7.7)
Neutrophils Relative %: 63 %
Platelets: 215 10*3/uL (ref 150–400)
RBC: 3.08 MIL/uL — ABNORMAL LOW (ref 3.87–5.11)
RDW: 15.9 % — ABNORMAL HIGH (ref 11.5–15.5)
WBC: 7.1 10*3/uL (ref 4.0–10.5)
nRBC: 0 % (ref 0.0–0.2)

## 2020-01-18 LAB — RENAL FUNCTION PANEL
Albumin: 3 g/dL — ABNORMAL LOW (ref 3.5–5.0)
Anion gap: 12 (ref 5–15)
BUN: 25 mg/dL — ABNORMAL HIGH (ref 8–23)
CO2: 26 mmol/L (ref 22–32)
Calcium: 8.7 mg/dL — ABNORMAL LOW (ref 8.9–10.3)
Chloride: 99 mmol/L (ref 98–111)
Creatinine, Ser: 9.19 mg/dL — ABNORMAL HIGH (ref 0.44–1.00)
GFR calc Af Amer: 4 mL/min — ABNORMAL LOW (ref >60)
GFR calc non Af Amer: 4 mL/min — ABNORMAL LOW (ref >60)
Glucose, Bld: 111 mg/dL — ABNORMAL HIGH (ref 70–99)
Phosphorus: 3.3 mg/dL (ref 2.5–4.6)
Potassium: 3.7 mmol/L (ref 3.5–5.1)
Sodium: 137 mmol/L (ref 135–145)

## 2020-01-18 LAB — PROTIME-INR
INR: 1.2 (ref 0.8–1.2)
Prothrombin Time: 14.4 seconds (ref 11.4–15.2)

## 2020-01-18 MED ORDER — ALTEPLASE 2 MG IJ SOLR: 2.0000 mg | Freq: Once | INTRAMUSCULAR | Status: DC | PRN

## 2020-01-18 MED ORDER — SODIUM CHLORIDE 0.9 % IV SOLN: 100.0000 mL | INTRAVENOUS | Status: DC | PRN

## 2020-01-18 MED ORDER — HEPARIN SODIUM (PORCINE) 1000 UNIT/ML DIALYSIS: 1000.0000 [IU] | INTRAMUSCULAR | Status: DC | PRN

## 2020-01-18 MED ORDER — PENTAFLUOROPROP-TETRAFLUOROETH EX AERO: 1.0000 "application " | INHALATION_SPRAY | CUTANEOUS | Status: DC | PRN

## 2020-01-18 MED ORDER — LIDOCAINE HCL (PF) 1 % IJ SOLN: 5.0000 mL | INTRAMUSCULAR | Status: DC | PRN

## 2020-01-18 MED ORDER — LIDOCAINE-PRILOCAINE 2.5-2.5 % EX CREA: 1.0000 "application " | TOPICAL_CREAM | CUTANEOUS | Status: DC | PRN

## 2020-01-18 MED ORDER — DOXERCALCIFEROL 4 MCG/2ML IV SOLN
INTRAVENOUS | Status: AC
  Filled 2020-01-18: qty 2

## 2020-01-18 NOTE — Progress Notes (Signed)
[facility] Progress Note   Subjective:    Seen in HD unit. UF goal 1.5L, tolerating UF. No new complaints today.   Objective Vitals:   01/18/20 0727 01/18/20 0730 01/18/20 0800 01/18/20 0830  BP: (!) 110/46 (!) 110/36 (!) 107/46 (!) 131/54  Pulse: 68 67 70 81  Resp:      Temp:      TempSrc:      SpO2:      Weight:      Height:       Physical Exam General:well appearing female in NAD Heart:RRR, +1/7 systolic murmur Lungs:mostly CTAB, BS decreased. nml WOB Abdomen:soft, NTND Extremities:no LE edema Dialysis Access: RU AVF +b/t   Filed Weights   01/16/20 1014 01/17/20 0325 01/18/20 0700  Weight: 108.5 kg 108.7 kg 105.3 kg    Intake/Output Summary (Last 24 hours) at 01/18/2020 0920 Last data filed at 01/17/2020 2125 Gross per 24 hour  Intake 360 ml  Output --  Net 360 ml    Additional Objective Labs: Basic Metabolic Panel: Recent Labs  Lab 01/16/20 0305 01/17/20 0408 01/18/20 0714  NA 137 137 137  K 4.1 3.7 3.7  CL 102 100 99  CO2 24 27 26   GLUCOSE 100* 100* 111*  BUN 46* 16 25*  CREATININE 11.44* 6.36* 9.19*  CALCIUM 8.0* 8.9 8.7*  PHOS  --   --  3.3   Liver Function Tests: Recent Labs  Lab 01/15/20 0110 01/15/20 0110 01/16/20 0305 01/17/20 0408 01/18/20 0714  AST 13*  --  12* 12*  --   ALT 11  --  12 12  --   ALKPHOS 75  --  82 83  --   BILITOT 0.6  --  0.3 0.5  --   PROT 5.7*  --  5.6* 5.6*  --   ALBUMIN 2.8*   < > 2.7* 2.8* 3.0*   < > = values in this interval not displayed.   CBC: Recent Labs  Lab 01/15/20 0110 01/15/20 0110 01/15/20 0739 01/15/20 0739 01/16/20 0305 01/17/20 0408 01/18/20 0508  WBC 6.0   < > 5.0   < > 5.8 6.1 7.1  NEUTROABS 2.8   < >  --   --  3.2 3.9 4.5  HGB 8.5*   < > 8.9*   < > 8.3* 8.5* 8.7*  HCT 27.1*   < > 28.3*   < > 26.5* 27.3* 28.0*  MCV 90.0  --  90.4  --  90.1 90.7 90.9  PLT 139*   < > 148*   < > 165 195 215   < > = values in this interval not displayed.    Lab Results  Component  Value Date   INR 1.2 01/18/2020   INR 1.2 01/17/2020   INR 1.3 (H) 01/16/2020   Studies/Results: No results found.  Medications:  [START ON 01/19/2020] sodium chloride     [START ON 01/19/2020] sodium chloride      alum & mag hydroxide-simeth  30 mL Oral Once   carvedilol  3.125 mg Oral BID WC   Chlorhexidine Gluconate Cloth  6 each Topical Q0600   Chlorhexidine Gluconate Cloth  6 each Topical Q0600   cinacalcet  60 mg Oral Q M,W,F   darbepoetin (ARANESP) injection - DIALYSIS  40 mcg Intravenous Q Fri-HD   doxercalciferol  3 mcg Intravenous Q M,W,F-HD   isosorbide dinitrate  20 mg Oral BID   pantoprazole  40 mg Oral Daily   polyethylene glycol  17 g Oral TID   senna  1 tablet Oral TID   sevelamer carbonate  1,600-2,400 mg Oral TID WC   warfarin  5 mg Oral ONCE-1600   Warfarin - Pharmacist Dosing Inpatient   Does not apply q1600    Dialysis Orders: Center:[facility] MWF. 180NRE, 3hr 45 min, BFR 450, DFR 800, EDW 106kg, 2K/3.5Ca, UF Profile 4, AVF 15g  - Hectorol 3 mcg IV q HD - Heparin 2500 unit bolus - Midodrine 10mg  1 tab pre-HD and 1 tab mid treatment PRN - Home BMM meds: Sensipar 60mg  daily, Renvela 2 tabs TID with meals  Assessment/Plan: 1. GI bleed:EGD 6/21 with duodenal polyp removal, repeat EGD 6/25 showed active bleeding at prior polypectomy site -> clipped/coagulated. S/p 3U pRBCs + 2 units FFP during admission. No further bleeding. GI following. 2. ESRD:Continue HD per MWF schedule. HD today on schedule. No heparin.  3. Hypertension/volume:BP controlled.  Patient does take midodrine predialysis as an outpatient - ordered.UF to EDW as tolerated.  4. Anemia:acute on chronic anemia d/t #1.  hgb 8.5. ESA given 6/25 - follow. 5. Metabolic bone disease:Corrected calcium at goal. Continue hectorol, sensipar and renvela. 6. Nutrition:Diet advanced by GI, changed to renal diet/ fluid restrictions.  7. A-fib: Warfarin on hold d/t bleed. To resume  warfarin 6/30 per pharmacy.   Lynnda Child PA-C [facility] 01/18/2020,9:20 AM

## 2020-01-18 NOTE — Progress Notes (Signed)
Physical Therapy Treatment Patient Details Name: Jane [name] MRN: [id number] DOB: 06/25/1946 Today's Date: 01/18/2020    History of Present Illness Pt is a 74 yo female presenting with multiple dark bloody stools following start of Coumadin. The pt is reccently s/p EGD and polyp removal 6/21, with PMH sig for duodenal polyp/adenoma, ESRD on HD MWF, afib on Coumadin, OSA on CPAP, HTN, and morbid obesity. PT received 2units PRBC 6/26.    PT Comments    Pt post-dialysis and fatigued upon PT arrival to room, but agreeable to hallway ambulation and LE exercises. Pt ambulated with use of RW and required no physical assist to perform, managing RW well. Pt also tolerated LE exercise well, PT administered and reviewed printed HEP to pt to improve LE strength and activity tolerance upon d/c from acute setting as pt declines HHPT. PT to continue to follow acutely.     Follow Up Recommendations  Supervision/Assistance - 24 hour;No PT follow up (agree with recommended HHPT; pt refusing)     Equipment Recommendations  None recommended by PT (pt refusing 3n1 (previously recommended))    Recommendations for Other Services       Precautions / Restrictions Precautions Precautions: Fall Restrictions Weight Bearing Restrictions: No    Mobility  Bed Mobility Overal bed mobility: Needs Assistance Bed Mobility: Supine to Sit;Sit to Supine     Supine to sit: Supervision Sit to supine: Supervision   General bed mobility comments: for safety, pt with use of HOB elevation to perform.  Transfers Overall transfer level: Needs assistance Equipment used: None Transfers: Sit to/from Stand Sit to Stand: Supervision         General transfer comment: for safety  Ambulation/Gait Ambulation/Gait assistance: Supervision Gait Distance (Feet): 210 Feet Assistive device: Rolling walker (2 wheeled) Gait Pattern/deviations: Decreased stride length;Wide base of support;Step-through pattern;Trunk  flexed Gait velocity: decr   General Gait Details: supervision with occasional contact guard for safety, verbal cuing for hallway navigation but otherwise pt safe with use of RW.   Stairs             Wheelchair Mobility    Modified Rankin (Stroke Patients Only)       Balance Overall balance assessment: Needs assistance Sitting-balance support: No upper extremity supported;Feet supported Sitting balance-Leahy Scale: Good     Standing balance support: During functional activity;No upper extremity supported Standing balance-Leahy Scale: Fair Standing balance comment: can statically stand without UE support, able to take ~5 steps without external support                            Cognition Arousal/Alertness: Awake/alert Behavior During Therapy: WFL for tasks assessed/performed Overall Cognitive Status: Within Functional Limits for tasks assessed                                 General Comments: pt reporting fatigue from dialysis and wanting a nap      Exercises General Exercises - Lower Extremity Long Arc Quad: AROM;Both;Seated;5 reps Straight Leg Raises: AROM;Both;Supine (demonstrated 1 SLR to ensure proper performance of SLR for HEP) Hip Flexion/Marching: AROM;Both;Seated;5 reps Mini-Sqauts: Both;5 reps (sit to stands from EOB, requiring verbal cues for correct hand placement when rising/sitting)    General Comments General comments (skin integrity, edema, etc.): HRmax 123 bpm during gait, with alarm of "vtach" multiple times. Pt asymptomatic.      Pertinent Vitals/Pain Pain Assessment:  No/denies pain Pain Intervention(s): Monitored during session    Home Living                      Prior Function            PT Goals (current goals can now be found in the care plan section) Acute Rehab PT Goals Patient Stated Goal: to return home without need for continued PT Time For Goal Achievement: 01/28/20 Potential to Achieve  Goals: Fair Progress towards PT goals: Progressing toward goals    Frequency    Min 3X/week      PT Plan Discharge plan needs to be updated;Equipment recommendations need to be updated    Co-evaluation              AM-PAC PT "6 Clicks" Mobility   Outcome Measure  Help needed turning from your back to your side while in a flat bed without using bedrails?: None Help needed moving from lying on your back to sitting on the side of a flat bed without using bedrails?: None Help needed moving to and from a bed to a chair (including a wheelchair)?: None Help needed standing up from a chair using your arms (e.g., wheelchair or bedside chair)?: None Help needed to walk in hospital room?: A Little Help needed climbing 3-5 steps with a railing? : A Little 6 Click Score: 22    End of Session   Activity Tolerance: Patient tolerated treatment well Patient left: in bed;with call bell/phone within reach;with bed alarm set Nurse Communication: Mobility status PT Visit Diagnosis: Muscle weakness (generalized) (M62.81);Difficulty in walking, not elsewhere classified (R26.2)     Time: 0539-7673 PT Time Calculation (min) (ACUTE ONLY): 17 min  Charges:  $Gait Training: 8-22 mins                    Destyne Goodreau E, PT [place] Pager [phone]  Office [phone]   Hazen Brumett D [place] 01/18/2020, 4:18 PM

## 2020-01-18 NOTE — Progress Notes (Signed)
Notified CCMD that pt is gone to dialysis. Will continue to monitor pt. Ranelle Oyster, RN

## 2020-01-18 NOTE — Progress Notes (Signed)
PROGRESS NOTE                                                                                                                                                                                                             Patient Demographics:    Jane [name], is a 74 y.o. female, DOB - 1946-02-13, VQM:[id number]  Admit date - 01/12/2020   Admitting Physician Toy Baker, MD  Outpatient Primary MD for the patient is [name], [name], MD  LOS - 6  Chief Complaint  Patient presents with  . Rectal Bleeding  . Post-op Problem       Brief Narrative  - 74 year old morbidly obese Caucasian female with history of duodenal polyp/adenoma, s/p EGD and a polyp removal on 01/09/2020, ESRD on MWF schedule, paroxysmal atrial fibrillation on Coumadin, OSA on CPAP at night, history of hemorrhoids, hypertension who presented to the hospital with multiple dark stools after she started her Coumadin on the day of admission.  She was found to be anemic and admitted to the hospital.   Subjective:   Patient in bed, appears comfortable, denies any headache, no fever, no chest pain or pressure, no shortness of breath , no abdominal pain. No focal weakness.  Reports her bowel movements is getting lighter in color, no further melena.    Assessment  & Plan :     1.  Acute blood loss anemia due to upper GI bleed.  With recent history of duodenal polyp removal with EGD on 01/09/2020, also suspicion for duodenal adenoma but recent EUS, she was recently started on Coumadin on the day of admission .  Received 2 unit of packed RBC on 01/13/2020 long with 1 unit of FFP and another 2 units of packed RBCs will be given on 01/14/2020, I think this is old bleeding, she is also seen by GI and underwent EGD showing bleeding site at the recent duodenal polyp removal, this was stabilized, -So far H&H remained stable, will go ahead and resume warfarin per GI recommendation, will repeat CBC in a.m.Marland Kitchen -If there is more  evidence of continued GI bleed, then IR can be consulted for embolization.  2.  Paroxysmal atrial fibrillation.  Mali vas 2 score of greater than 3.  Hold Coumadin, she received a unit of FFP on 01/13/2020 and 2 mg of vitamin K on 01/14/2020, resume Coreg at lower than home dose monitoring her blood pressure.  If no further bleeding, will start on warfarin today, will let INR gradually rise in  the setting of recent GI bleed.  3.  ESRD.  On MWF schedule, renal consulted.  4.  Essential hypertension - currently only on low-dose Coreg.  Transfuse and monitor.  5.  Morbid obesity with OSA.  BMI of 41, follow with PCP for weight loss, CPAP nightly.    Condition - Extremely Guarded  Family Communication  : Daughter [name] [phone]  - on 01/13/2020, 01/15/2020  Code Status :  Full  Consults  :  GI, Renal, IR  Procedures  :    EGD 01/13/20  - showing bleeding at duodenal polyp removal site.  PUD Prophylaxis : PPI  Disposition Plan  :    Status is: Inpatient  Remains inpatient appropriate because:IV treatments appropriate due to intensity of illness or inability to take PO   Dispo: The patient is from: Home              Anticipated d/c is to: Home              Anticipated d/c date is: 1 day              Patient currently is not medically stable to d/c.  Hopefully can be discharged if her H&H is stable after starting warfarin.  DVT Prophylaxis  :   SCDs   Lab Results  Component Value Date   PLT 215 01/18/2020   Lab Results  Component Value Date   INR 1.2 01/18/2020   INR 1.2 01/17/2020   INR 1.3 (H) 01/16/2020    Diet :  Diet Order            Diet renal with fluid restriction Fluid restriction: 1200 mL Fluid; Room service appropriate? Yes; Fluid consistency: Thin  Diet effective now                  Inpatient Medications Scheduled Meds: . alum & mag hydroxide-simeth  30 mL Oral Once  . carvedilol  3.125 mg Oral BID WC  . Chlorhexidine Gluconate Cloth  6 each  Topical Q0600  . Chlorhexidine Gluconate Cloth  6 each Topical Q0600  . cinacalcet  60 mg Oral Q M,W,F  . darbepoetin (ARANESP) injection - DIALYSIS  40 mcg Intravenous Q Fri-HD  . doxercalciferol  3 mcg Intravenous Q M,W,F-HD  . isosorbide dinitrate  20 mg Oral BID  . pantoprazole  40 mg Oral Daily  . polyethylene glycol  17 g Oral TID  . senna  1 tablet Oral TID  . sevelamer carbonate  1,600-2,400 mg Oral TID WC  . warfarin  5 mg Oral ONCE-1600  . Warfarin - Pharmacist Dosing Inpatient   Does not apply q1600   Continuous Infusions:  PRN Meds:.acetaminophen **OR** [DISCONTINUED] acetaminophen, albuterol, hydrALAZINE, [DISCONTINUED] ondansetron **OR** ondansetron (ZOFRAN) IV  Antibiotics  :   Anti-infectives (From admission, onward)   Start     Dose/Rate Route Frequency Ordered Stop   01/17/20 0900  fluconazole (DIFLUCAN) tablet 100 mg        100 mg Oral  Once 01/17/20 0800 01/17/20 0845          Objective:   Vitals:   01/18/20 1030 01/18/20 1100 01/18/20 1130 01/18/20 1219  BP: (!) 122/45 (!) 121/56 (!) 116/55 (!) 136/53  Pulse: 78 78 76 80  Resp:    15  Temp:    98.2 F (36.8 C)  TempSrc:    Oral  SpO2:    98%  Weight:      Height:  SpO2: 98 % O2 Flow Rate (L/min): 2 L/min  Wt Readings from Last 3 Encounters:  01/18/20 105.8 kg  01/09/20 105.2 kg  12/01/19 107 kg     Intake/Output Summary (Last 24 hours) at 01/18/2020 1525 Last data filed at 01/17/2020 2125 Gross per 24 hour  Intake 120 ml  Output --  Net 120 ml     Physical Exam  Awake Alert, Oriented X 3, No new F.N deficits, Normal affect Symmetrical Chest wall movement, Good air movement bilaterally, CTAB RRR,No Gallops,Rubs or new Murmurs, No Parasternal Heave +ve B.Sounds, Abd Soft, No tenderness, No rebound - guarding or rigidity. No Cyanosis, Clubbing or edema, No new Rash or bruise      Data Review:    Recent Labs  Lab 01/14/20 0545 01/14/20 1852 01/15/20 0110 01/15/20 0739  01/16/20 0305 01/17/20 0408 01/18/20 0508  WBC 5.0   < > 6.0 5.0 5.8 6.1 7.1  HGB 6.9*   < > 8.5* 8.9* 8.3* 8.5* 8.7*  HCT 23.4*   < > 27.1* 28.3* 26.5* 27.3* 28.0*  PLT 132*   < > 139* 148* 165 195 215  MCV 96.7   < > 90.0 90.4 90.1 90.7 90.9  MCH 28.5   < > 28.2 28.4 28.2 28.2 28.2  MCHC 29.5*   < > 31.4 31.4 31.3 31.1 31.1  RDW 16.3*   < > 16.4* 16.3* 15.8* 15.8* 15.9*  LYMPHSABS 0.8  --  1.7  --  1.3 0.9 1.1  MONOABS 0.6  --  0.8  --  0.7 0.7 0.7  EOSABS 0.4  --  0.7*  --  0.7* 0.6* 0.8*  BASOSABS 0.0  --  0.0  --  0.0 0.0 0.0   < > = values in this interval not displayed.    Recent Labs  Lab 01/12/20 1942 01/12/20 1942 01/13/20 4010 01/13/20 2725 01/14/20 0122 01/14/20 0545 01/15/20 0110 01/16/20 0305 01/17/20 0408 01/18/20 0508 01/18/20 0714  NA 138   < > 139   < >  --  136 135 137 137  --  137  K 4.8   < > 4.8   < >  --  4.0 3.8 4.1 3.7  --  3.7  CL 102   < > 103   < >  --  99 99 102 100  --  99  CO2 23   < > 25   < >  --  23 26 24 27   --  26  GLUCOSE 105*   < > 92   < >  --  88 86 100* 100*  --  111*  BUN 41*   < > 52*   < >  --  22 30* 46* 16  --  25*  CREATININE 11.20*   < > 12.04*   < >  --  6.61* 8.53* 11.44* 6.36*  --  9.19*  CALCIUM 8.1*   < > 7.8*   < >  --  8.0* 8.1* 8.0* 8.9  --  8.7*  AST 15   < > 14*  --   --  14* 13* 12* 12*  --   --   ALT 12   < > 12  --   --  12 11 12 12   --   --   ALKPHOS 90   < > 83  --   --  75 75 82 83  --   --   BILITOT 0.6   < > 0.7  --   --  0.7 0.6 0.3 0.5  --   --   ALBUMIN 3.0*   < > 2.7*   < >  --  2.6* 2.8* 2.7* 2.8*  --  3.0*  MG  --   --  2.0  --   --  1.8 1.8 2.0 1.8  --   --   INR 1.3*  --  1.3*  --   --   --   --  1.3* 1.2 1.2  --   TSH  --   --  0.351  --   --   --   --   --   --   --   --   BNP  --   --  122.4*  --  81.1  --  81.6 224.9* 226.0*  --   --    < > = values in this interval not displayed.    Recent Labs  Lab 01/12/20 2204 01/13/20 0733 01/14/20 0122 01/15/20 0110 01/16/20 0305  01/17/20 0408  BNP  --  122.4* 81.1 81.6 224.9* 226.0*  SARSCOV2NAA NEGATIVE  --   --   --   --   --     ------------------------------------------------------------------------------------------------------------------ No results for input(s): CHOL, HDL, LDLCALC, TRIG, CHOLHDL, LDLDIRECT in the last 72 hours.  No results found for: HGBA1C ------------------------------------------------------------------------------------------------------------------ No results for input(s): TSH, T4TOTAL, T3FREE, THYROIDAB in the last 72 hours.  Invalid input(s): FREET3 ------------------------------------------------------------------------------------------------------------------ No results for input(s): VITAMINB12, FOLATE, FERRITIN, TIBC, IRON, RETICCTPCT in the last 72 hours.  Coagulation profile Recent Labs  Lab 01/12/20 1942 01/13/20 0733 01/16/20 0305 01/17/20 0408 01/18/20 0508  INR 1.3* 1.3* 1.3* 1.2 1.2    No results for input(s): DDIMER in the last 72 hours.  Cardiac Enzymes No results for input(s): CKMB, TROPONINI, MYOGLOBIN in the last 168 hours.  Invalid input(s): CK ------------------------------------------------------------------------------------------------------------------    Component Value Date/Time   BNP 226.0 (H) 01/17/2020 0408    Micro Results Recent Results (from the past 240 hour(s))  SARS Coronavirus 2 by RT PCR (hospital order, performed in [facility] hospital lab) Nasopharyngeal Nasopharyngeal Swab     Status: None   Collection Time: 01/12/20 10:04 PM   Specimen: Nasopharyngeal Swab  Result Value Ref Range Status   SARS Coronavirus 2 NEGATIVE NEGATIVE Final    Comment: (NOTE) SARS-CoV-2 target nucleic acids are NOT DETECTED.  The SARS-CoV-2 RNA is generally detectable in upper and lower respiratory specimens during the acute phase of infection. The lowest concentration of SARS-CoV-2 viral copies this assay can detect is 250 copies / mL. A  negative result does not preclude SARS-CoV-2 infection and should not be used as the sole basis for treatment or other patient management decisions.  A negative result may occur with improper specimen collection / handling, submission of specimen other than nasopharyngeal swab, presence of viral mutation(s) within the areas targeted by this assay, and inadequate number of viral copies (<250 copies / mL). A negative result must be combined with clinical observations, patient history, and epidemiological information.  Fact Sheet for Patients:   StrictlyIdeas.no  Fact Sheet for Healthcare Providers: BankingDealers.co.za  This test is not yet approved or  cleared by the [place] FDA and has been authorized for detection and/or diagnosis of SARS-CoV-2 by FDA under an Emergency Use Authorization (EUA).  This EUA will remain in effect (meaning this test can be used) for the duration of the COVID-19 declaration under Section 564(b)(1) of the Act, 21 U.S.C. section 360bbb-3(b)(1), unless the authorization is terminated  or revoked sooner.  Performed at [facility], [place] [address] [place], [place] [address]   MRSA PCR Screening     Status: None   Collection Time: 01/13/20  4:44 AM   Specimen: Nasal Mucosa; Nasopharyngeal  Result Value Ref Range Status   MRSA by PCR NEGATIVE NEGATIVE Final    Comment:        The GeneXpert MRSA Assay (FDA approved for NASAL specimens only), is one component of a comprehensive MRSA colonization surveillance program. It is not intended to diagnose MRSA infection nor to guide or monitor treatment for MRSA infections. Performed at [facility], [place] [address], [place], [place] [address]     Radiology Reports No results found.   Phillips Climes M.D on 01/18/2020 at 3:25 PM  To page go to [email or web address]

## 2020-01-19 DIAGNOSIS — Z992 Dependence on renal dialysis: Secondary | ICD-10-CM | POA: Diagnosis not present

## 2020-01-19 DIAGNOSIS — I10 Essential (primary) hypertension: Secondary | ICD-10-CM | POA: Diagnosis not present

## 2020-01-19 DIAGNOSIS — I158 Other secondary hypertension: Secondary | ICD-10-CM | POA: Diagnosis not present

## 2020-01-19 DIAGNOSIS — I480 Paroxysmal atrial fibrillation: Secondary | ICD-10-CM | POA: Diagnosis not present

## 2020-01-19 DIAGNOSIS — N186 End stage renal disease: Secondary | ICD-10-CM | POA: Diagnosis not present

## 2020-01-19 LAB — CBC WITH DIFFERENTIAL/PLATELET
Abs Immature Granulocytes: 0.02 10*3/uL (ref 0.00–0.07)
Basophils Absolute: 0 10*3/uL (ref 0.0–0.1)
Basophils Relative: 0 %
Eosinophils Absolute: 0.7 10*3/uL — ABNORMAL HIGH (ref 0.0–0.5)
Eosinophils Relative: 12 %
HCT: 27.1 % — ABNORMAL LOW (ref 36.0–46.0)
Hemoglobin: 8.5 g/dL — ABNORMAL LOW (ref 12.0–15.0)
Immature Granulocytes: 0 %
Lymphocytes Relative: 22 %
Lymphs Abs: 1.3 10*3/uL (ref 0.7–4.0)
MCH: 28.5 pg (ref 26.0–34.0)
MCHC: 31.4 g/dL (ref 30.0–36.0)
MCV: 90.9 fL (ref 80.0–100.0)
Monocytes Absolute: 0.8 10*3/uL (ref 0.1–1.0)
Monocytes Relative: 13 %
Neutro Abs: 3.1 10*3/uL (ref 1.7–7.7)
Neutrophils Relative %: 53 %
Platelets: 171 10*3/uL (ref 150–400)
RBC: 2.98 MIL/uL — ABNORMAL LOW (ref 3.87–5.11)
RDW: 16 % — ABNORMAL HIGH (ref 11.5–15.5)
WBC: 6 10*3/uL (ref 4.0–10.5)
nRBC: 0 % (ref 0.0–0.2)

## 2020-01-19 LAB — PROTIME-INR
INR: 1.2 (ref 0.8–1.2)
Prothrombin Time: 14.8 seconds (ref 11.4–15.2)

## 2020-01-19 MED ORDER — ISOSORBIDE DINITRATE 10 MG PO TABS: 20.0000 mg | ORAL_TABLET | Freq: Two times a day (BID) | ORAL | Status: DC

## 2020-01-19 MED ORDER — WARFARIN SODIUM 5 MG PO TABS: 5.0000 mg | ORAL_TABLET | Freq: Once | ORAL | Status: DC

## 2020-01-19 MED ORDER — CARVEDILOL 25 MG PO TABS: 12.5000 mg | ORAL_TABLET | Freq: Two times a day (BID) | ORAL | 0 refills | Status: DC

## 2020-01-19 NOTE — Progress Notes (Signed)
Jane [name] to be D/C'd Home per MD order.  Discussed with the patient and all questions fully answered.  VSS, Skin clean, dry and intact without evidence of skin break down, no evidence of skin tears noted. IV catheter discontinued intact. Site without signs and symptoms of complications. Dressing and pressure applied.  An After Visit Summary was printed and given to the patient.   D/c education completed with patient/family including follow up instructions, medication list, d/c activities limitations if indicated, with other d/c instructions as indicated by MD - patient able to verbalize understanding, all questions fully answered.   Patient instructed to return to ED, call 911, or call MD for any changes in condition.   Patient escorted via [place], and D/C home via private auto.  Jane [name] 01/19/2020 1:20 PM

## 2020-01-19 NOTE — Discharge Instructions (Signed)
Follow with Primary MD [name], [name], MD in 7 days   Get CBC, CMP,INR, checked  by Primary MD next visit.    Activity: As tolerated with Full fall precautions use walker/cane & assistance as needed   Disposition Home    Diet: Renal Modified with 1200 CC fluid restriction.   On your next visit with your primary care physician please Get Medicines reviewed and adjusted.   Please request your Prim.MD to go over all Hospital Tests and Procedure/Radiological results at the follow up, please get all Hospital records sent to your Prim MD by signing hospital release before you go home.   If you experience worsening of your admission symptoms, develop shortness of breath, life threatening emergency, suicidal or homicidal thoughts you must seek medical attention immediately by calling 911 or calling your MD immediately  if symptoms less severe.  You Must read complete instructions/literature along with all the possible adverse reactions/side effects for all the Medicines you take and that have been prescribed to you. Take any new Medicines after you have completely understood and accpet all the possible adverse reactions/side effects.   Do not drive, operating heavy machinery, perform activities at heights, swimming or participation in water activities or provide baby sitting services if your were admitted for syncope or siezures until you have seen by Primary MD or a Neurologist and advised to do so again.  Do not drive when taking Pain medications.    Do not take more than prescribed Pain, Sleep and Anxiety Medications  Special Instructions: If you have smoked or chewed Tobacco  in the last 2 yrs please stop smoking, stop any regular Alcohol  and or any Recreational drug use.  Wear Seat belts while driving.   Please note  You were cared for by a hospitalist during your hospital stay. If you have any questions about your discharge medications or the care you received while you were in  the hospital after you are discharged, you can call the unit and asked to speak with the hospitalist on call if the hospitalist that took care of you is not available. Once you are discharged, your primary care physician will handle any further medical issues. Please note that NO REFILLS for any discharge medications will be authorized once you are discharged, as it is imperative that you return to your primary care physician (or establish a relationship with a primary care physician if you do not have one) for your aftercare needs so that they can reassess your need for medications and monitor your lab values.

## 2020-01-19 NOTE — Progress Notes (Signed)
[facility] Progress Note   Subjective:    Completed dialysis yesterday 1L UF.  Seen in room. Feels well, happy to be going home.   Objective Vitals:   01/18/20 2020 01/19/20 0013 01/19/20 0422 01/19/20 0741  BP: 116/68 (!) 122/44 (!) 134/50 134/61  Pulse: 74 77 79 68  Resp: 17 14 15  (!) 21  Temp: 98.2 F (36.8 C) 98.3 F (36.8 C) 98.6 F (37 C) 98.1 F (36.7 C)  TempSrc: Oral Oral Oral Oral  SpO2: 97% 93% 92% 93%  Weight:      Height:       Physical Exam General:well appearing female in NAD Heart:RRR, +9/3 systolic murmur Lungs:mostly CTAB, BS decreased. nml WOB Abdomen:soft, NTND Extremities:no LE edema Dialysis Access: RU AVF +b/t   Filed Weights   01/18/20 0700 01/18/20 0712 01/18/20 1137  Weight: 105.3 kg 105.8 kg 103.4 kg    Intake/Output Summary (Last 24 hours) at 01/19/2020 1059 Last data filed at 01/18/2020 1137 Gross per 24 hour  Intake --  Output 1011 ml  Net -1011 ml    Additional Objective Labs: Basic Metabolic Panel: Recent Labs  Lab 01/16/20 0305 01/17/20 0408 01/18/20 0714  NA 137 137 137  K 4.1 3.7 3.7  CL 102 100 99  CO2 24 27 26   GLUCOSE 100* 100* 111*  BUN 46* 16 25*  CREATININE 11.44* 6.36* 9.19*  CALCIUM 8.0* 8.9 8.7*  PHOS  --   --  3.3   Liver Function Tests: Recent Labs  Lab 01/15/20 0110 01/15/20 0110 01/16/20 0305 01/17/20 0408 01/18/20 0714  AST 13*  --  12* 12*  --   ALT 11  --  12 12  --   ALKPHOS 75  --  82 83  --   BILITOT 0.6  --  0.3 0.5  --   PROT 5.7*  --  5.6* 5.6*  --   ALBUMIN 2.8*   < > 2.7* 2.8* 3.0*   < > = values in this interval not displayed.   CBC: Recent Labs  Lab 01/15/20 0739 01/15/20 0739 01/16/20 0305 01/16/20 0305 01/17/20 0408 01/18/20 0508 01/19/20 0140  WBC 5.0   < > 5.8   < > 6.1 7.1 6.0  NEUTROABS  --   --  3.2   < > 3.9 4.5 3.1  HGB 8.9*   < > 8.3*   < > 8.5* 8.7* 8.5*  HCT 28.3*   < > 26.5*   < > 27.3* 28.0* 27.1*  MCV 90.4  --  90.1  --  90.7 90.9 90.9   PLT 148*   < > 165   < > 195 215 171   < > = values in this interval not displayed.    Lab Results  Component Value Date   INR 1.2 01/19/2020   INR 1.2 01/18/2020   INR 1.2 01/17/2020   Studies/Results: No results found.  Medications:   alum & mag hydroxide-simeth  30 mL Oral Once   carvedilol  3.125 mg Oral BID WC   Chlorhexidine Gluconate Cloth  6 each Topical Q0600   Chlorhexidine Gluconate Cloth  6 each Topical Q0600   cinacalcet  60 mg Oral Q M,W,F   darbepoetin (ARANESP) injection - DIALYSIS  40 mcg Intravenous Q Fri-HD   doxercalciferol  3 mcg Intravenous Q M,W,F-HD   isosorbide dinitrate  20 mg Oral BID   pantoprazole  40 mg Oral Daily   polyethylene glycol  17 g Oral TID  senna  1 tablet Oral TID   sevelamer carbonate  1,600-2,400 mg Oral TID WC   warfarin  5 mg Oral ONCE-1600   Warfarin - Pharmacist Dosing Inpatient   Does not apply q1600    Dialysis Orders: Center:[facility] MWF. 180NRE, 3hr 45 min, BFR 450, DFR 800, EDW 106kg, 2K/3.5Ca, UF Profile 4, AVF 15g  - Hectorol 3 mcg IV q HD - Heparin 2500 unit bolus - Midodrine 10mg  1 tab pre-HD and 1 tab mid treatment PRN - Home BMM meds: Sensipar 60mg  daily, Renvela 2 tabs TID with meals  Assessment/Plan: 1. GI bleed:EGD 6/21 with duodenal polyp removal, repeat EGD 6/25 showed active bleeding at prior polypectomy site -> clipped/coagulated. S/p 3U pRBCs + 2 units FFP during admission. No further bleeding.  2. ESRD:Continue HD per MWF schedule. Next HD 7/2.  3. Hypertension/volume:BP controlled.  Patient does take midodrine predialysis as an outpatient. Post HD weight 103.4kg  4. Anemia:acute on chronic anemia d/t #1.  hgb 8.5. Darbe 40  given 6/25 - follow. 5. Metabolic bone disease:Corrected calcium at goal. Continue hectorol, sensipar and renvela. 6. Nutrition:Diet advanced by GI, changed to renal diet/ fluid restrictions.  7. A-fib: Warfarin on hold d/t bleed. Resumed 6/30 per  pharmacy.   Lynnda Child PA-C [facility] 01/19/2020,10:59 AM

## 2020-01-19 NOTE — Discharge Summary (Signed)
Jane [name], is a 74 y.o. female  DOB 1945/08/17  MRN [id number].  Admission date:  01/12/2020  Admitting Physician  Toy Baker, MD  Discharge Date:  01/19/2020   Primary MD  [name], [name], MD  Recommendations for primary care physician for things to follow:  -Please check CBC, BMP closely. -Please check INR level closely and adjust warfarin dose as needed.  Admission Diagnosis  Acute GI bleeding [K92.2] Lower GI bleed [K92.2]   Discharge Diagnosis  Acute GI bleeding [K92.2] Lower GI bleed [K92.2]    Active Problems:   ESRD on hemodialysis (HCC)   Paroxysmal atrial fibrillation (HCC)   Hypertension with renal disease   Chronic anticoagulation   Body mass index 40.0-44.9, adult (HCC)   Orthostatic hypotension   Intestinal metaplasia of gastric mucosa   Acute GI bleeding   Duodenal ulcer with hemorrhage      Past Medical History:  Diagnosis Date  . Acid reflux   . Anemia of chronic disease   . Arthritis   . Asthma   . Atrial fibrillation ([place])   . Bilateral carotid bruits   . Complication of anesthesia    "hard to wake up, I have sleep apnea" no CPAP  . Depression   . Diverticulitis   . Dysrhythmia    Afib  . ESRD (end stage renal disease) ([place])    MWF [place]  . Headache   . History of blood transfusion   . Hypertension   . Malaise and fatigue   . Orthostatic hypotension   . Shortness of breath    " when I walk to fast"  . Sleep apnea   . Syncope   . Tubulovillous adenoma of colon     Past Surgical History:  Procedure Laterality Date  . AV FISTULA PLACEMENT    . BACK SURGERY     Lumbar fusion L 4 and L 5  . BIOPSY  01/09/2020   Procedure: BIOPSY;  Surgeon: Rush Landmark Telford Nab., MD;  Location: [place];  Service: Gastroenterology;;  . COLONOSCOPY    . ENDOSCOPIC MUCOSAL RESECTION N/A 01/09/2020   Procedure: ENDOSCOPIC MUCOSAL RESECTION;  Surgeon:  Rush Landmark Telford Nab., MD;  Location: [place];  Service: Gastroenterology;  Laterality: N/A;  . ESOPHAGOGASTRODUODENOSCOPY    . ESOPHAGOGASTRODUODENOSCOPY (EGD) WITH PROPOFOL N/A 01/09/2020   Procedure: ESOPHAGOGASTRODUODENOSCOPY (EGD) WITH PROPOFOL;  Surgeon: Rush Landmark Telford Nab., MD;  Location: [facility];  Service: Gastroenterology;  Laterality: N/A;  . ESOPHAGOGASTRODUODENOSCOPY (EGD) WITH PROPOFOL N/A 01/13/2020   Procedure: ESOPHAGOGASTRODUODENOSCOPY (EGD) WITH PROPOFOL;  Surgeon: Irene Shipper, MD;  Location: [facility];  Service: Gastroenterology;  Laterality: N/A;  . EUS N/A 01/09/2020   Procedure: UPPER ENDOSCOPIC ULTRASOUND (EUS) RADIAL;  Surgeon: Rush Landmark Telford Nab., MD;  Location: [facility];  Service: Gastroenterology;  Laterality: N/A;  . HEMOSTASIS CLIP PLACEMENT  01/09/2020   Procedure: HEMOSTASIS CLIP PLACEMENT;  Surgeon: Irving Copas., MD;  Location: [facility];  Service: Gastroenterology;;  . HEMOSTASIS CONTROL  01/13/2020   Procedure: HEMOSTASIS CONTROL;  Surgeon:  Irene Shipper, MD;  Location: [facility] ENDOSCOPY;  Service: Gastroenterology;;  Vassie Loll  . HOT HEMOSTASIS N/A 01/13/2020   Procedure: HOT HEMOSTASIS (ARGON PLASMA COAGULATION/BICAP);  Surgeon: Irene Shipper, MD;  Location: [facility];  Service: Gastroenterology;  Laterality: N/A;  . IR GENERIC HISTORICAL  07/09/2016   IR US GUIDE VASC ACCESS RIGHT 07/09/2016 Arne Cleveland, MD [facility]  . IR GENERIC HISTORICAL  07/09/2016   IR FLUORO GUIDE CV LINE RIGHT 07/09/2016 Arne Cleveland, MD [facility]  . KNEE ARTHROPLASTY Left   . LAPAROSCOPIC SIGMOID COLECTOMY N/A 07/11/2016   Procedure: LAPAROSCOPIC SIGMOID COLECTOMY;  Surgeon: Clovis Riley, MD;  Location: [facility];  Service: General;  Laterality: N/A;  . SCLEROTHERAPY  01/09/2020   Procedure: Clide Deutscher;  Surgeon: Mansouraty, Telford Nab., MD;  Location: [facility] [place];  Service: Gastroenterology;;  . Clide Deutscher  01/13/2020    Procedure: SCLEROTHERAPY;  Surgeon: Irene Shipper, MD;  Location: [facility] ENDOSCOPY;  Service: Gastroenterology;;  . Lia Foyer LIFTING INJECTION  01/09/2020   Procedure: SUBMUCOSAL LIFTING INJECTION;  Surgeon: Irving Copas., MD;  Location: [facility];  Service: Gastroenterology;;  . TUBAL LIGATION         History of present illness and  Hospital Course:     Kindly see H&P for history of present illness and admission details, please review complete Labs, Consult reports and Test reports for all details in brief  HPI  from the history and physical done on the day of admission 01/12/2020 HPI: Jane [name] is a 74 y.o. female with medical history significant of large duodenal mass paroxismal a.fib on Coumadin, ESRD on HD M,W F, orthostatic hypotension, OSA on CPAP external hemorrhoids but tolerating Coumadin, bilateral carotid bruits, hypertension  Presented with  Had EGD on Monday attempt at Duodenal TVA there after had cataract surgery Yesterday  she restarted coumadin and today started to have bloody/maroon BM  4-5 patient called her GI doctor who recommended arrival to emergency department.  Since coming to emergency department patient had at least one more bloody BM.   Her last HD was on Tuesday  HE last   Infectious risk factors:  Reports none   Has  been vaccinated against COVID in [place] Hospital Course     1.  Acute blood loss anemia due to upper GI bleed. -   With recent history of duodenal polyp removal with EGD on 01/09/2020,S/p 01/09/20 EGD with EMR of duodenal adenomatous polyp (path TVA w/o carcinoma, dysplasia), site clipped. Gastric HP polyp removed.   6/26 EGD with massive bleeding and pulsatile oozing at site of EMR resection, treated with epi, gold probe application achieving hemostasis -  Hemoglobin low at 6.7 on admission, so far she required 3 units PRBC transfusion, underwent fresh frozen plasma, hemoglobin has been  stable since transfusion . -No further melena, hemoglobin has been stable, patient has been cleared by GI to resume warfarin, resumed on warfarin 01/18/2020.  2.  Paroxysmal atrial fibrillation.  Mali vas 2 score of greater than 3. -Patient maintained normal sinus rhythm during hospital stay, pressure on the lower side, so her Coreg dose was decreased from 25 home dose to 12.5 mg on discharg. -Resumed on warfarin, will allow to trend up slowly, discussed with her, she will schedule an appointment next week with warfarin clinic to follow INR level and dose to be adjusted if needed.  3.  ESRD.  On MWF schedule, renal consulted.  4.  Essential hypertension -blood pressure been acceptable during hospital stay, hydralazine has been stopped, Imdur dose was lowered and Coreg dose was lowered as well .  5.  Morbid obesity with OSA.  BMI of 41, follow with PCP for weight loss, CPAP nightly.  Home health PT was offered to the patient, but she declined any home health services currently.   Discharge Condition:  stable   Follow UP   Follow-up Information    Pyrtle, Lajuan Lines, MD Follow up.   Specialty: Gastroenterology Why: office will call you or reach out by letter regarding any necessary follow up.  GI available sooner if needed for any urgent issues.   Contact information: [address] [place] [address] [phone]        [name], [name], MD Follow up.   Specialty: Family Medicine Contact information: [place] [phone]                 Discharge Instructions  and  Discharge Medications    Discharge Instructions    Discharge instructions   Complete by: As directed    Follow with Primary MD [name], [name], MD in 7 days   Get CBC, CMP,INR, checked  by Primary MD next visit.    Activity: As tolerated with Full fall precautions use walker/cane & assistance as needed   Disposition Home    Diet: Renal Modified with  1200 CC fluid restriction.   On your next visit with your primary care physician please Get Medicines reviewed and adjusted.   Please request your Prim.MD to go over all Hospital Tests and Procedure/Radiological results at the follow up, please get all Hospital records sent to your Prim MD by signing hospital release before you go home.   If you experience worsening of your admission symptoms, develop shortness of breath, life threatening emergency, suicidal or homicidal thoughts you must seek medical attention immediately by calling 911 or calling your MD immediately  if symptoms less severe.  You Must read complete instructions/literature along with all the possible adverse reactions/side effects for all the Medicines you take and that have been prescribed to you. Take any new Medicines after you have completely understood and accpet all the possible adverse reactions/side effects.   Do not drive, operating heavy machinery, perform activities at heights, swimming or participation in water activities or provide baby sitting services if your were admitted for syncope or siezures until you have seen by Primary MD or a Neurologist and advised to do so again.  Do not drive when taking Pain medications.    Do not take more than prescribed Pain, Sleep and Anxiety Medications  Special Instructions: If you have smoked or chewed Tobacco  in the last 2 yrs please stop smoking, stop any regular Alcohol  and or any Recreational drug use.  Wear Seat belts while driving.   Please note  You were cared for by a hospitalist during your hospital stay. If you have any questions about your discharge medications or the care you received while you were in the hospital after you are discharged, you can call the unit and asked to speak with the hospitalist on call if the hospitalist that took care of you is not available. Once you are discharged, your primary care physician will handle any further medical  issues. Please note that NO REFILLS for any discharge medications will be authorized once you are discharged, as it is imperative that you return to your primary care  physician (or establish a relationship with a primary care physician if you do not have one) for your aftercare needs so that they can reassess your need for medications and monitor your lab values.   Increase activity slowly   Complete by: As directed      Allergies as of 01/19/2020      Reactions   Latex Rash   Penicillins    Yeast infection    Sulfa Antibiotics Rash   Tape Other (See Comments)   Plastic, silicone, and paper tape causes bruising and pulls off skin. Cloth tape works fine      Medication List    STOP taking these medications   hydrALAZINE 25 MG tablet Commonly known as: APRESOLINE     TAKE these medications   acetaminophen 500 MG tablet Commonly known as: TYLENOL Take 1,500 mg by mouth daily as needed for headache.   albuterol 108 (90 Base) MCG/ACT inhaler Commonly known as: VENTOLIN HFA Inhale 1 puff into the lungs every 6 (six) hours as needed for wheezing or shortness of breath. What changed: how much to take   Besivance 0.6 % Susp Generic drug: Besifloxacin HCl Place 1 drop into the right eye 3 (three) times daily.   carvedilol 25 MG tablet Commonly known as: COREG Take 0.5 tablets (12.5 mg total) by mouth 2 (two) times daily with a meal. What changed: how much to take   cinacalcet 60 MG tablet Commonly known as: SENSIPAR Take 60 mg by mouth every Monday, Wednesday, and Friday.   ethyl chloride spray Apply 1 application topically daily as needed (port access).   isosorbide dinitrate 10 MG tablet Commonly known as: ISORDIL Take 2 tablets (20 mg total) by mouth 2 (two) times daily. What changed:   how much to take  when to take this   MIRCERA IJ IV with dialysis   multivitamin Tabs tablet Take 1 tablet by mouth at bedtime.   nystatin cream Commonly known as:  MYCOSTATIN Apply 1 application topically 2 (two) times daily. What changed:   when to take this  reasons to take this   omeprazole 40 MG capsule Commonly known as: PRILOSEC Take 1 capsule (40 mg total) by mouth 2 (two) times daily before a meal.   polyethylene glycol 17 g packet Commonly known as: MIRALAX / GLYCOLAX Take 17 g by mouth in the morning, at noon, and at bedtime.   Prolensa 0.07 % Soln Generic drug: Bromfenac Sodium Place 1 drop into the right eye every evening.   senna 8.6 MG tablet Commonly known as: SENOKOT Take 3 tablets by mouth 2 (two) times daily.   sevelamer carbonate 800 MG tablet Commonly known as: RENVELA Take 1,600-2,400 mg by mouth See admin instructions. Take Take 1600 to 2400 mg with each meal   triamcinolone cream 0.1 % Commonly known as: KENALOG Apply 1 application topically 2 (two) times daily.   warfarin 5 MG tablet Commonly known as: COUMADIN Take as directed. If you are unsure how to take this medication, talk to your nurse or doctor. Original instructions: TAKE 1 & 1/2 (ONE & ONE-HALF) TABLETS BY MOUTH EVERY TUESDAY AND EVERY THURSDAY AND 1 TABLET ONCE DAILY ALL OTHER DAYS What changed: See the new instructions.         Diet and Activity recommendation: See Discharge Instructions above   Consults obtained -  GI, Renal, IR  Major procedures and Radiology Reports - PLEASE review detailed and final reports for all details, in brief -  EGD 01/13/20  - showing bleeding at duodenal polyp removal site. No results found.  Micro Results    Recent Results (from the past 240 hour(s))  SARS Coronavirus 2 by RT PCR (hospital order, performed in [facility] hospital lab) Nasopharyngeal Nasopharyngeal Swab     Status: None   Collection Time: 01/12/20 10:04 PM   Specimen: Nasopharyngeal Swab  Result Value Ref Range Status   SARS Coronavirus 2 NEGATIVE NEGATIVE Final    Comment: (NOTE) SARS-CoV-2 target nucleic acids are NOT  DETECTED.  The SARS-CoV-2 RNA is generally detectable in upper and lower respiratory specimens during the acute phase of infection. The lowest concentration of SARS-CoV-2 viral copies this assay can detect is 250 copies / mL. A negative result does not preclude SARS-CoV-2 infection and should not be used as the sole basis for treatment or other patient management decisions.  A negative result may occur with improper specimen collection / handling, submission of specimen other than nasopharyngeal swab, presence of viral mutation(s) within the areas targeted by this assay, and inadequate number of viral copies (<250 copies / mL). A negative result must be combined with clinical observations, patient history, and epidemiological information.  Fact Sheet for Patients:   StrictlyIdeas.no  Fact Sheet for Healthcare Providers: BankingDealers.co.za  This test is not yet approved or  cleared by the [place] FDA and has been authorized for detection and/or diagnosis of SARS-CoV-2 by FDA under an Emergency Use Authorization (EUA).  This EUA will remain in effect (meaning this test can be used) for the duration of the COVID-19 declaration under Section 564(b)(1) of the Act, 21 U.S.C. section 360bbb-3(b)(1), unless the authorization is terminated or revoked sooner.  Performed at [facility], [place] [address] [place], [place] [address]   MRSA PCR Screening     Status: None   Collection Time: 01/13/20  4:44 AM   Specimen: Nasal Mucosa; Nasopharyngeal  Result Value Ref Range Status   MRSA by PCR NEGATIVE NEGATIVE Final    Comment:        The GeneXpert MRSA Assay (FDA approved for NASAL specimens only), is one component of a comprehensive MRSA colonization surveillance program. It is not intended to diagnose MRSA infection nor to guide or monitor treatment for MRSA infections. Performed at [facility], [place] [address]  [place], [place] [address]        Today   Subjective:   Jane [name] today has no headache,no chest abdominal pain,no new weakness tingling or numbness, feels much better wants to go home today.  with bowel movement x2 yesterday, no melena, reports the color is getting lighter, currently describe it as dark green.  Objective:   Blood pressure 134/61, pulse 68, temperature 98.1 F (36.7 C), temperature source Oral, resp. rate (!) 21, height 5\' 5"  (1.651 m), weight 103.4 kg, SpO2 93 %.   Intake/Output Summary (Last 24 hours) at 01/19/2020 1046 Last data filed at 01/18/2020 1137 Gross per 24 hour  Intake --  Output 1011 ml  Net -1011 ml    Exam Awake Alert, Oriented x 3, No new F.N deficits, Normal affect Symmetrical Chest wall movement, Good air movement bilaterally, CTAB RRR,No Gallops,Rubs or new Murmurs, No Parasternal Heave +ve B.Sounds, Abd Soft, Non tender,  No rebound -guarding or rigidity. No Cyanosis, Clubbing or edema, No new Rash or bruise  Data Review   CBC w Diff:  Lab Results  Component Value Date   WBC 6.0 01/19/2020   HGB 8.5 (  L) 01/19/2020   HCT 27.1 (L) 01/19/2020   PLT 171 01/19/2020   LYMPHOPCT 22 01/19/2020   MONOPCT 13 01/19/2020   EOSPCT 12 01/19/2020   BASOPCT 0 01/19/2020    CMP:  Lab Results  Component Value Date   NA 137 01/18/2020   K 3.7 01/18/2020   CL 99 01/18/2020   CO2 26 01/18/2020   BUN 25 (H) 01/18/2020   CREATININE 9.19 (H) 01/18/2020   PROT 5.6 (L) 01/17/2020   ALBUMIN 3.0 (L) 01/18/2020   BILITOT 0.5 01/17/2020   ALKPHOS 83 01/17/2020   AST 12 (L) 01/17/2020   ALT 12 01/17/2020  .   Total Time in preparing paper work, data evaluation and todays exam - 26 minutes  Phillips Climes M.D on 01/19/2020 at 10:46 AM  [facility]   Office  [phone]

## 2020-01-19 NOTE — TOC Transition Note (Signed)
Transition of Care [facility]) - CM/SW Discharge Note   Patient Details  Name: Jane [name] MRN: [id number] Date of Birth: 07-16-1946  [facility] CM/SW Contact:  Verdell Carmine, RN Phone Number: 01/19/2020, 12:31 PM   Clinical Narrative:    Patient being discharged. Will follow up with coumadin/Heart clinic. Understands she needs to keep  Appointments for coumadin checks.  She has a walker at home. She does not want PT at home, she states she can do this on her own.   Final next level of care: Home/Self Care Barriers to Discharge: No Barriers Identified   Patient Goals and CMS Choice Patient states their goals for this hospitalization and ongoing recovery are:: to go home CMS Medicare.gov Compare Post Acute Care list provided to:: Patient Choice offered to / list presented to : Patient  Discharge Placement             Home, self care          Discharge Plan and Services                          HH Arranged: Refused [facility]          Social Determinants of Health (SDOH) Interventions     Readmission Risk Interventions No flowsheet data found.

## 2020-01-19 NOTE — Progress Notes (Signed)
ANTICOAGULATION CONSULT NOTE - Consult  Pharmacy Consult for coumadin  Indication: atrial fibrillation  Allergies  Allergen Reactions  . Latex Rash  . Penicillins     Yeast infection   . Sulfa Antibiotics Rash  . Tape Other (See Comments)    Plastic, silicone, and paper tape causes bruising and pulls off skin. Cloth tape works fine    Patient Measurements: Height: 5\' 5"  (165.1 cm) Weight: 103.4 kg (227 lb 15.3 oz) IBW/kg (Calculated) : 57 Heparin Dosing Weight:   Vital Signs: Temp: 98.6 F (37 C) (07/01 0422) Temp Source: Oral (07/01 0422) BP: 134/50 (07/01 0422) Pulse Rate: 79 (07/01 0422)  Labs: Recent Labs    01/17/20 0408 01/17/20 0408 01/18/20 0508 01/18/20 0714 01/19/20 0140  HGB 8.5*   < > 8.7*  --  8.5*  HCT 27.3*  --  28.0*  --  27.1*  PLT 195  --  215  --  171  LABPROT 14.7  --  14.4  --  14.8  INR 1.2  --  1.2  --  1.2  CREATININE 6.36*  --   --  9.19*  --    < > = values in this interval not displayed.    Estimated Creatinine Clearance: 6.3 mL/min (A) (by C-G formula based on SCr of 9.19 mg/dL (H)).   Medical History: Past Medical History:  Diagnosis Date  . Acid reflux   . Anemia of chronic disease   . Arthritis   . Asthma   . Atrial fibrillation ([place])   . Bilateral carotid bruits   . Complication of anesthesia    "hard to wake up, I have sleep apnea" no CPAP  . Depression   . Diverticulitis   . Dysrhythmia    Afib  . ESRD (end stage renal disease) ([place])    MWF [place]  . Headache   . History of blood transfusion   . Hypertension   . Malaise and fatigue   . Orthostatic hypotension   . Shortness of breath    " when I walk to fast"  . Sleep apnea   . Syncope   . Tubulovillous adenoma of colon     Medications:  Medications Prior to Admission  Medication Sig Dispense Refill Last Dose  . acetaminophen (TYLENOL) 500 MG tablet Take 1,500 mg by mouth daily as needed for headache.   unk  . albuterol (PROVENTIL HFA;VENTOLIN  HFA) 108 (90 Base) MCG/ACT inhaler Inhale 1 puff into the lungs every 6 (six) hours as needed for wheezing or shortness of breath. (Patient taking differently: Inhale 2 puffs into the lungs every 6 (six) hours as needed for wheezing or shortness of breath. ) 6.7 g 2 unk  . BESIVANCE 0.6 % SUSP Place 1 drop into the right eye 3 (three) times daily.   01/12/2020 at Unknown time  . carvedilol (COREG) 25 MG tablet Take 25 mg by mouth 2 (two) times daily with a meal.   01/12/2020 at 1700  . cinacalcet (SENSIPAR) 60 MG tablet Take 60 mg by mouth every Monday, Wednesday, and Friday.    Past Week at Unknown time  . ethyl chloride spray Apply 1 application topically daily as needed (port access).   12 Past Week at Unknown time  . hydrALAZINE (APRESOLINE) 25 MG tablet Take 1 tablet (25 mg total) by mouth 3 (three) times daily. 90 tablet 2 Past Week at Unknown time  . isosorbide dinitrate (ISORDIL) 10 MG tablet Take 3 tablets (30 mg total) by  mouth 3 (three) times daily. (Patient taking differently: Take 20 mg by mouth 3 (three) times daily. ) 90 tablet 2 Past Week at Unknown time  . multivitamin (RENA-VIT) TABS tablet Take 1 tablet by mouth at bedtime.    01/12/2020 at Unknown time  . nystatin cream (MYCOSTATIN) Apply 1 application topically 2 (two) times daily. (Patient taking differently: Apply 1 application topically 2 (two) times daily as needed (rash). ) 45 g 0 unk  . omeprazole (PRILOSEC) 40 MG capsule Take 1 capsule (40 mg total) by mouth 2 (two) times daily before a meal. 60 capsule 2 01/12/2020 at Unknown time  . polyethylene glycol (MIRALAX / GLYCOLAX) 17 g packet Take 17 g by mouth in the morning, at noon, and at bedtime.   Past Week at Unknown time  . PROLENSA 0.07 % SOLN Place 1 drop into the right eye every evening.   01/12/2020 at Unknown time  . senna (SENOKOT) 8.6 MG tablet Take 3 tablets by mouth 2 (two) times daily.   Past Week at Unknown time  . sevelamer carbonate (RENVELA) 800 MG tablet Take  1,600-2,400 mg by mouth See admin instructions. Take Take 1600 to 2400 mg with each meal   01/12/2020 at Unknown time  . triamcinolone cream (KENALOG) 0.1 % Apply 1 application topically 2 (two) times daily. 45 g 0 unk  . warfarin (COUMADIN) 5 MG tablet TAKE 1 & 1/2 (ONE & ONE-HALF) TABLETS BY MOUTH EVERY TUESDAY AND EVERY THURSDAY AND 1 TABLET ONCE DAILY ALL OTHER DAYS (Patient taking differently: Take 5-7.5 mg by mouth See admin instructions. Take 7.5 mg at night on Mon, Wed, and Fri. Take 5 mg at night on Sun, Tue, Thurs, and Sat) 113 tablet 0 01/12/2020 at 1700  . Methoxy PEG-Epoetin Beta (MIRCERA IJ) IV with dialysis      Scheduled:  . alum & mag hydroxide-simeth  30 mL Oral Once  . carvedilol  3.125 mg Oral BID WC  . Chlorhexidine Gluconate Cloth  6 each Topical Q0600  . Chlorhexidine Gluconate Cloth  6 each Topical Q0600  . cinacalcet  60 mg Oral Q M,W,F  . darbepoetin (ARANESP) injection - DIALYSIS  40 mcg Intravenous Q Fri-HD  . doxercalciferol  3 mcg Intravenous Q M,W,F-HD  . isosorbide dinitrate  20 mg Oral BID  . pantoprazole  40 mg Oral Daily  . polyethylene glycol  17 g Oral TID  . senna  1 tablet Oral TID  . sevelamer carbonate  1,600-2,400 mg Oral TID WC  . Warfarin - Pharmacist Dosing Inpatient   Does not apply q1600    Assessment: Pt was admitted for GIB after a recent procedure for duodenal polyp removal. She has received several PRBC. Hgb has been stable around 8.5. Plan is to resume her coumadin tomorrow with less aggressive dosing. She is getting fluconazole x 1 dose.  INR 1.2 S/p 2mg  vit k on 6/26 and 5 mg 6/30  Warfarin Dosing PTA - 5 mg daily except 7.5 mg MWF  Goal of Therapy:  INR 2-3 Monitor platelets by anticoagulation protocol: Yes   Plan:  Coumadin 5mg  PO x 1 Daily INR  Alanda Slim, PharmD, [place] Clinical Pharmacist Please see AMION for all Pharmacists' Contact Phone Numbers 01/19/2020, 7:35 AM

## 2020-01-20 ENCOUNTER — Telehealth: Payer: Self-pay | Admitting: Nephrology

## 2020-01-20 DIAGNOSIS — N2581 Secondary hyperparathyroidism of renal origin: Secondary | ICD-10-CM | POA: Diagnosis not present

## 2020-01-20 DIAGNOSIS — N186 End stage renal disease: Secondary | ICD-10-CM | POA: Diagnosis not present

## 2020-01-20 DIAGNOSIS — D509 Iron deficiency anemia, unspecified: Secondary | ICD-10-CM | POA: Diagnosis not present

## 2020-01-20 DIAGNOSIS — D631 Anemia in chronic kidney disease: Secondary | ICD-10-CM | POA: Diagnosis not present

## 2020-01-20 DIAGNOSIS — Z992 Dependence on renal dialysis: Secondary | ICD-10-CM | POA: Diagnosis not present

## 2020-01-20 NOTE — Telephone Encounter (Signed)
Transition of care contact from inpatient facility  Date of discharge: 01/19/20 Date of contact: 01/20/20 Method: Phone Spoke to: Patient  Patient contacted to discuss transition of care from recent inpatient hospitalization. Patient was admitted to [facility] from 6/24 -01/20/20 with discharge diagnosis of acute blood loss anemia secondary to GI bleeding   Medication changes were reviewed.  Patient will follow up with his/her outpatient HD unit on: Patient had dialysis at her outpatient unit today 7/2.

## 2020-01-23 DIAGNOSIS — D509 Iron deficiency anemia, unspecified: Secondary | ICD-10-CM | POA: Diagnosis not present

## 2020-01-23 DIAGNOSIS — D631 Anemia in chronic kidney disease: Secondary | ICD-10-CM | POA: Diagnosis not present

## 2020-01-23 DIAGNOSIS — N2581 Secondary hyperparathyroidism of renal origin: Secondary | ICD-10-CM | POA: Diagnosis not present

## 2020-01-23 DIAGNOSIS — Z992 Dependence on renal dialysis: Secondary | ICD-10-CM | POA: Diagnosis not present

## 2020-01-23 DIAGNOSIS — N186 End stage renal disease: Secondary | ICD-10-CM | POA: Diagnosis not present

## 2020-01-24 ENCOUNTER — Other Ambulatory Visit: Payer: Self-pay | Admitting: *Deleted

## 2020-01-24 ENCOUNTER — Ambulatory Visit: Payer: Medicare Other | Admitting: Pharmacist

## 2020-01-24 ENCOUNTER — Other Ambulatory Visit: Payer: Self-pay

## 2020-01-24 DIAGNOSIS — I480 Paroxysmal atrial fibrillation: Secondary | ICD-10-CM

## 2020-01-24 DIAGNOSIS — Z5181 Encounter for therapeutic drug level monitoring: Secondary | ICD-10-CM

## 2020-01-24 LAB — POCT INR: INR: 2.1 (ref 2.0–3.0)

## 2020-01-24 NOTE — Patient Outreach (Signed)
[facility] Care Management  01/24/2020  Jane [name] 06/06/1946 [id number]   EMMI-GENERAL DISCHARGE-SUCCESSFUL-RESOLVED RED ON EMMI ALERT Day #1 Date: 01/21/2020 Red Alert Reason: Transportation  Outreach #1 Rn spoke with pt concerning the above emmi. Pt states she has [employer] but just wanted in inquired on other possible transportation sources due to the long waits in between pick up of pts. Although limited with public transportation resources and pt does not have MCD for additional transportation coverage offered another sources [employer]) however pt was not interested and will continue to use [employer]. No other needs presented at this time. Pt state she has been attending all her appointments and will follow up with her provider next month.   Plan: Will close this case with no other needs at this time.  Raina Mina, RN Care Management Coordinator [place] Office [phone]

## 2020-01-24 NOTE — Progress Notes (Signed)
Anticoagulation Management Jane [name] is a 74 y.o. female who reports to the clinic for monitoring of warfarin treatment.    Indication: atrial fibrillation CHA2DS2 Vasc Score 4 (Age >49, female, HTN hx), HAS-BLED 2 (Age>65, renal disease)  Duration: indefinite Supervising physician: Adrian Prows  Anticoagulation Clinic Visit History:  Patient does not report signs/symptoms of bleeding or thromboembolism.  Other recent changes: No change in diet, medications, lifestyle.   EGD/EUS procedure on 6/21. Duodenal poly removed. Warfarin held for 5 days prior. Restarted warfarin on 6/23. C/o of maroon stools and diarrhea with dark red blood on 6/24. Hospitalization for GI bleed from 6/25-7/1. Pt give Vit K 2.5 mg on 6/26. Pt had 3 units of pRBCs and 2 units FFP during hospital admission. Pt was restarted on previous home warfarin dose on 6/30  Pt also has a cataract surgery scheduled for 7/14.  Anticoagulation Episode Summary    Current INR goal:  2.0-3.0  TTR:  77.6 % (1.3 y)  Next INR check:  01/31/2020  INR from last check:  2.1 (01/24/2020)  Weekly max warfarin dose:    Target end date:  Indefinite  INR check location:    Preferred lab:    Send INR reminders to:     Indications   Paroxysmal atrial fibrillation (HCC) [I48.0] Monitoring for long-term anticoagulant use [Z51.81 Z79.01]       Comments:          Allergies  Allergen Reactions  . Latex Rash  . Penicillins     Yeast infection   . Sulfa Antibiotics Rash  . Tape Other (See Comments)    Plastic, silicone, and paper tape causes bruising and pulls off skin. Cloth tape works fine    Current Outpatient Medications:  .  acetaminophen (TYLENOL) 500 MG tablet, Take 1,500 mg by mouth daily as needed for headache., Disp: , Rfl:  .  albuterol (PROVENTIL HFA;VENTOLIN HFA) 108 (90 Base) MCG/ACT inhaler, Inhale 1 puff into the lungs every 6 (six) hours as needed for wheezing or shortness of breath. (Patient taking differently: Inhale  2 puffs into the lungs every 6 (six) hours as needed for wheezing or shortness of breath. ), Disp: 6.7 g, Rfl: 2 .  BESIVANCE 0.6 % SUSP, Place 1 drop into the right eye 3 (three) times daily., Disp: , Rfl:  .  carvedilol (COREG) 25 MG tablet, Take 0.5 tablets (12.5 mg total) by mouth 2 (two) times daily with a meal., Disp: 60 tablet, Rfl: 0 .  cinacalcet (SENSIPAR) 60 MG tablet, Take 60 mg by mouth every Monday, Wednesday, and Friday. , Disp: , Rfl:  .  ethyl chloride spray, Apply 1 application topically daily as needed (port access). , Disp: , Rfl: 12 .  isosorbide dinitrate (ISORDIL) 10 MG tablet, Take 2 tablets (20 mg total) by mouth 2 (two) times daily., Disp: , Rfl:  .  Methoxy PEG-Epoetin Beta (MIRCERA IJ), IV with dialysis, Disp: , Rfl:  .  multivitamin (RENA-VIT) TABS tablet, Take 1 tablet by mouth at bedtime. , Disp: , Rfl:  .  nystatin cream (MYCOSTATIN), Apply 1 application topically 2 (two) times daily. (Patient taking differently: Apply 1 application topically 2 (two) times daily as needed (rash). ), Disp: 45 g, Rfl: 0 .  omeprazole (PRILOSEC) 40 MG capsule, Take 1 capsule (40 mg total) by mouth 2 (two) times daily before a meal., Disp: 60 capsule, Rfl: 2 .  polyethylene glycol (MIRALAX / GLYCOLAX) 17 g packet, Take 17 g by mouth in the  morning, at noon, and at bedtime., Disp: , Rfl:  .  PROLENSA 0.07 % SOLN, Place 1 drop into the right eye every evening., Disp: , Rfl:  .  senna (SENOKOT) 8.6 MG tablet, Take 3 tablets by mouth 2 (two) times daily., Disp: , Rfl:  .  sevelamer carbonate (RENVELA) 800 MG tablet, Take 1,600-2,400 mg by mouth See admin instructions. Take Take 1600 to 2400 mg with each meal, Disp: , Rfl:  .  triamcinolone cream (KENALOG) 0.1 %, Apply 1 application topically 2 (two) times daily., Disp: 45 g, Rfl: 0 .  warfarin (COUMADIN) 5 MG tablet, TAKE 1 & 1/2 (ONE & ONE-HALF) TABLETS BY MOUTH EVERY TUESDAY AND EVERY THURSDAY AND 1 TABLET ONCE DAILY ALL OTHER DAYS (Patient  taking differently: Take 5-7.5 mg by mouth See admin instructions. Take 7.5 mg at night on Mon, Wed, and Fri. Take 5 mg at night on Sun, Tue, Thurs, and Sat), Disp: 113 tablet, Rfl: 0 Past Medical History:  Diagnosis Date  . Acid reflux   . Anemia of chronic disease   . Arthritis   . Asthma   . Atrial fibrillation ([place])   . Bilateral carotid bruits   . Complication of anesthesia    "hard to wake up, I have sleep apnea" no CPAP  . Depression   . Diverticulitis   . Dysrhythmia    Afib  . ESRD (end stage renal disease) ([place])    MWF [facility]  . Headache   . History of blood transfusion   . Hypertension   . Malaise and fatigue   . Orthostatic hypotension   . Shortness of breath    " when I walk to fast"  . Sleep apnea   . Syncope   . Tubulovillous adenoma of colon    ASSESSMENT  Recent Results: The most recent result is correlated with 35 mg per week:  Lab Results  Component Value Date   INR 2.1 01/24/2020   INR 1.2 01/19/2020   INR 1.2 01/18/2020    Anticoagulation Dosing: Description   INR at goal. Continue taking 7.5 mg every Mon, Wed, Fri and 5 mg all other days. Recheck in 1 weeks.      INR today: Therapeutic. INR returns to being within therapeutic range following restarting previous maintenance dose. Pt denies any recent complains of bleeding or brussing symptoms. Will continue to close monitoring to assess for bleeding or bruising ADRs and to ensure INR remains therapeutic.   PLAN Weekly dose was unchanged. Continue taking 7.5 mg every Mon, Wed, Fri and 5 mg all other days. Recheck INR in 1 weeks.     Patient Instructions  INR at goal. Continue taking 7.5 mg every Mon, Wed, Fri and 5 mg all other days. Recheck in 1 weeks.  Patient advised to contact clinic or seek medical attention if signs/symptoms of bleeding or thromboembolism occur.  Patient verbalized understanding by repeating back information and was advised to contact me if further  medication-related questions arise.   Follow-up Return in about 1 week (around 01/31/2020).  [name], PharmD  15 minutes spent face-to-face with the patient during the encounter. 50% of time spent on education, including signs/sx bleeding and clotting, as well as food and drug interactions with warfarin. 50% of time was spent on fingerprick POC INR sample collection,processing, results determination, and documentation

## 2020-01-24 NOTE — Patient Instructions (Signed)
INR at goal. Continue taking 7.5 mg every Mon, Wed, Fri and 5 mg all other days. Recheck in 1 weeks.

## 2020-01-25 ENCOUNTER — Other Ambulatory Visit: Payer: Self-pay | Admitting: *Deleted

## 2020-01-25 ENCOUNTER — Telehealth: Payer: Self-pay | Admitting: *Deleted

## 2020-01-25 DIAGNOSIS — Z992 Dependence on renal dialysis: Secondary | ICD-10-CM | POA: Diagnosis not present

## 2020-01-25 DIAGNOSIS — D631 Anemia in chronic kidney disease: Secondary | ICD-10-CM | POA: Diagnosis not present

## 2020-01-25 DIAGNOSIS — K922 Gastrointestinal hemorrhage, unspecified: Secondary | ICD-10-CM | POA: Diagnosis not present

## 2020-01-25 DIAGNOSIS — N2581 Secondary hyperparathyroidism of renal origin: Secondary | ICD-10-CM | POA: Diagnosis not present

## 2020-01-25 DIAGNOSIS — D509 Iron deficiency anemia, unspecified: Secondary | ICD-10-CM | POA: Diagnosis not present

## 2020-01-25 DIAGNOSIS — I120 Hypertensive chronic kidney disease with stage 5 chronic kidney disease or end stage renal disease: Secondary | ICD-10-CM | POA: Diagnosis not present

## 2020-01-25 DIAGNOSIS — N186 End stage renal disease: Secondary | ICD-10-CM | POA: Diagnosis not present

## 2020-01-25 NOTE — Telephone Encounter (Signed)
Left message for patient to call back. She has been scheduled for a follow up with Dr Hilarie Fredrickson on 04/03/20 at 10:10 am.

## 2020-01-25 NOTE — Telephone Encounter (Signed)
I have spoken to patient to advise of appointment time and date as well as location. She verbalizes understanding of this information.

## 2020-01-25 NOTE — Patient Outreach (Signed)
[place] [facility]) Care Management  01/25/2020  Jane [name] Jul 17, 1946 [id number]   EMMI-GENERAL DISCHARGE-RESOLVED RED ON EMMI ALERT Day #4 Date:01/24/2020 Red Alert Reason: LOST OF INTEREST/SAD/HOPELESS/ANXIOUS/EMPTY  OUTREACH #1 RN spoke with pt concerning the above emmi. Pt states she is not suicidal and has not mental thoughts about hurting herself or others. States she is just "tired and needs to get her energy back". RN verified pt has a good support system in and outside the home. Declined any alerts to her providers or interventions at this time indicating if she felt this was getting worse she is aware to call her doctor for medicine. Pt states she is reglious and aware her God has helped her this far and aware of when she needs to pray. Pt declined all options at this time for any assistance including counseling via [facility] or referral to a psychiatrist and any interventional numbers for future assessment with no needs at this time. Pt was also informed of [facility] [employer] [facility] that can intervene if needed (declined). States she is no where near this status and just wants to get her energy back. Encouraged pt to change environments go outside, get some fresh air or spend time with her family and friends if this was a safe time for her to interact with others (receptive).  Plan: Based upon the pt's response will close this case with no further needs.    Raina Mina, RN Care Management Coordinator [place] Office [phone]

## 2020-01-25 NOTE — Telephone Encounter (Signed)
-----   Message from Patti E [name], [place] sent at 01/25/2020 10:06 AM EDT ----- Regarding: FW: timing of follow up  ----- Message ----- From: Vena Rua, PA-C Sent: 01/17/2020  10:18 AM EDT To: Patti E [name], CMA, Jerene Bears, MD Subject: timing of follow up                            Hi Unable to get pt a fup at office, Sept schedule not yet released and MD, APPs have no avialable appts.   Just sending this so she stays on radar for arranging fup in future Admitted w bleed from EMR site of duodenal polyp .   Also has issue w chronic constipation, which was original compaint leading to GI referral some months back. Thanks.

## 2020-01-26 ENCOUNTER — Other Ambulatory Visit: Payer: Self-pay | Admitting: Family Medicine

## 2020-01-26 DIAGNOSIS — I480 Paroxysmal atrial fibrillation: Secondary | ICD-10-CM

## 2020-01-27 DIAGNOSIS — Z992 Dependence on renal dialysis: Secondary | ICD-10-CM | POA: Diagnosis not present

## 2020-01-27 DIAGNOSIS — N2581 Secondary hyperparathyroidism of renal origin: Secondary | ICD-10-CM | POA: Diagnosis not present

## 2020-01-27 DIAGNOSIS — N186 End stage renal disease: Secondary | ICD-10-CM | POA: Diagnosis not present

## 2020-01-27 DIAGNOSIS — D631 Anemia in chronic kidney disease: Secondary | ICD-10-CM | POA: Diagnosis not present

## 2020-01-27 DIAGNOSIS — D509 Iron deficiency anemia, unspecified: Secondary | ICD-10-CM | POA: Diagnosis not present

## 2020-01-30 DIAGNOSIS — N186 End stage renal disease: Secondary | ICD-10-CM | POA: Diagnosis not present

## 2020-01-30 DIAGNOSIS — D509 Iron deficiency anemia, unspecified: Secondary | ICD-10-CM | POA: Diagnosis not present

## 2020-01-30 DIAGNOSIS — D631 Anemia in chronic kidney disease: Secondary | ICD-10-CM | POA: Diagnosis not present

## 2020-01-30 DIAGNOSIS — Z992 Dependence on renal dialysis: Secondary | ICD-10-CM | POA: Diagnosis not present

## 2020-01-30 DIAGNOSIS — N2581 Secondary hyperparathyroidism of renal origin: Secondary | ICD-10-CM | POA: Diagnosis not present

## 2020-01-31 ENCOUNTER — Ambulatory Visit: Payer: Medicare Other | Admitting: Pharmacist

## 2020-01-31 ENCOUNTER — Other Ambulatory Visit: Payer: Self-pay

## 2020-01-31 DIAGNOSIS — Z5181 Encounter for therapeutic drug level monitoring: Secondary | ICD-10-CM

## 2020-01-31 DIAGNOSIS — I480 Paroxysmal atrial fibrillation: Secondary | ICD-10-CM

## 2020-01-31 LAB — POCT INR: INR: 2.5 (ref 2.0–3.0)

## 2020-01-31 NOTE — Patient Instructions (Signed)
INR at goal. Continue taking 7.5 mg every Mon, Wed, Fri and 5 mg all other days. Recheck in 3 weeks.

## 2020-01-31 NOTE — Progress Notes (Signed)
Anticoagulation Management Jane [name] is a 74 y.o. female who reports to the clinic for monitoring of warfarin treatment.    Indication: atrial fibrillation CHA2DS2 Vasc Score 4 (Age >64, female, HTN hx), HAS-BLED 2 (Age>65, renal disease)  Duration: indefinite Supervising physician: Adrian Prows  Anticoagulation Clinic Visit History:  Patient does not report signs/symptoms of bleeding or thromboembolism.  Other recent changes: No change in diet, medications, lifestyle. Complains of fatigue   Pt has a f/u cataract surgery scheduled for 7/14.  Anticoagulation Episode Summary    Current INR goal:  2.0-3.0  TTR:  77.9 % (1.3 y)  Next INR check:  02/21/2020  INR from last check:  2.5 (01/31/2020)  Weekly max warfarin dose:    Target end date:  Indefinite  INR check location:    Preferred lab:    Send INR reminders to:     Indications   Paroxysmal atrial fibrillation (HCC) [I48.0] Monitoring for long-term anticoagulant use [Z51.81 Z79.01]       Comments:          Allergies  Allergen Reactions   Latex Rash   Penicillins     Yeast infection    Sulfa Antibiotics Rash   Tape Other (See Comments)    Plastic, silicone, and paper tape causes bruising and pulls off skin. Cloth tape works fine    Current Outpatient Medications:    acetaminophen (TYLENOL) 500 MG tablet, Take 1,500 mg by mouth daily as needed for headache., Disp: , Rfl:    albuterol (PROVENTIL HFA;VENTOLIN HFA) 108 (90 Base) MCG/ACT inhaler, Inhale 1 puff into the lungs every 6 (six) hours as needed for wheezing or shortness of breath. (Patient taking differently: Inhale 2 puffs into the lungs every 6 (six) hours as needed for wheezing or shortness of breath. ), Disp: 6.7 g, Rfl: 2   BESIVANCE 0.6 % SUSP, Place 1 drop into the right eye 3 (three) times daily., Disp: , Rfl:    carvedilol (COREG) 25 MG tablet, Take 0.5 tablets (12.5 mg total) by mouth 2 (two) times daily with a meal., Disp: 60 tablet, Rfl: 0    cinacalcet (SENSIPAR) 60 MG tablet, Take 60 mg by mouth every Monday, Wednesday, and Friday. , Disp: , Rfl:    ethyl chloride spray, Apply 1 application topically daily as needed (port access). , Disp: , Rfl: 12   isosorbide dinitrate (ISORDIL) 10 MG tablet, Take 2 tablets (20 mg total) by mouth 2 (two) times daily., Disp: , Rfl:    Methoxy PEG-Epoetin Beta (MIRCERA IJ), IV with dialysis, Disp: , Rfl:    multivitamin (RENA-VIT) TABS tablet, Take 1 tablet by mouth at bedtime. , Disp: , Rfl:    nystatin cream (MYCOSTATIN), Apply 1 application topically 2 (two) times daily. (Patient taking differently: Apply 1 application topically 2 (two) times daily as needed (rash). ), Disp: 45 g, Rfl: 0   omeprazole (PRILOSEC) 40 MG capsule, Take 1 capsule (40 mg total) by mouth 2 (two) times daily before a meal., Disp: 60 capsule, Rfl: 2   polyethylene glycol (MIRALAX / GLYCOLAX) 17 g packet, Take 17 g by mouth in the morning, at noon, and at bedtime., Disp: , Rfl:    PROLENSA 0.07 % SOLN, Place 1 drop into the right eye every evening., Disp: , Rfl:    senna (SENOKOT) 8.6 MG tablet, Take 3 tablets by mouth 2 (two) times daily., Disp: , Rfl:    sevelamer carbonate (RENVELA) 800 MG tablet, Take 1,600-2,400 mg by mouth See admin  instructions. Take Take 1600 to 2400 mg with each meal, Disp: , Rfl:    triamcinolone cream (KENALOG) 0.1 %, Apply 1 application topically 2 (two) times daily., Disp: 45 g, Rfl: 0   warfarin (COUMADIN) 5 MG tablet, As directed by coumadin clinic., Disp: 113 tablet, Rfl: 2 Past Medical History:  Diagnosis Date   Acid reflux    Anemia of chronic disease    Arthritis    Asthma    Atrial fibrillation (HCC)    Bilateral carotid bruits    Complication of anesthesia    "hard to wake up, I have sleep apnea" no CPAP   Depression    Diverticulitis    Dysrhythmia    Afib   ESRD (end stage renal disease) ([place])    MWF [place]   Headache    History of blood  transfusion    Hypertension    Malaise and fatigue    Orthostatic hypotension    Shortness of breath    " when I walk to fast"   Sleep apnea    Syncope    Tubulovillous adenoma of colon    ASSESSMENT  Recent Results: The most recent result is correlated with 42.5 mg per week:  Lab Results  Component Value Date   INR 2.5 01/31/2020   INR 2.1 01/24/2020   INR 1.2 01/19/2020    Anticoagulation Dosing: Description   INR at goal. Continue taking 7.5 mg every Mon, Wed, Fri and 5 mg all other days. Recheck in 3 weeks.      INR today: Therapeutic. INR returns to being within therapeutic range following restarting previous maintenance dose. Pt denies any recent complains of bleeding or brussing symptoms. Will continue to close monitoring to assess for bleeding or bruising ADRs and to ensure INR remains therapeutic.   PLAN Weekly dose was unchanged. Continue taking 7.5 mg every Mon, Wed, Fri and 5 mg all other days. Recheck INR in 3 weeks.     Patient Instructions  INR at goal. Continue taking 7.5 mg every Mon, Wed, Fri and 5 mg all other days. Recheck in 3 weeks.  Patient advised to contact clinic or seek medical attention if signs/symptoms of bleeding or thromboembolism occur.  Patient verbalized understanding by repeating back information and was advised to contact me if further medication-related questions arise.   Follow-up Return in about 3 weeks (around 02/21/2020).  [name], PharmD  15 minutes spent face-to-face with the patient during the encounter. 50% of time spent on education, including signs/sx bleeding and clotting, as well as food and drug interactions with warfarin. 50% of time was spent on fingerprick POC INR sample collection,processing, results determination, and documentation

## 2020-02-01 DIAGNOSIS — H2512 Age-related nuclear cataract, left eye: Secondary | ICD-10-CM | POA: Diagnosis not present

## 2020-02-02 DIAGNOSIS — Z992 Dependence on renal dialysis: Secondary | ICD-10-CM | POA: Diagnosis not present

## 2020-02-02 DIAGNOSIS — D631 Anemia in chronic kidney disease: Secondary | ICD-10-CM | POA: Diagnosis not present

## 2020-02-02 DIAGNOSIS — D509 Iron deficiency anemia, unspecified: Secondary | ICD-10-CM | POA: Diagnosis not present

## 2020-02-02 DIAGNOSIS — N2581 Secondary hyperparathyroidism of renal origin: Secondary | ICD-10-CM | POA: Diagnosis not present

## 2020-02-02 DIAGNOSIS — N186 End stage renal disease: Secondary | ICD-10-CM | POA: Diagnosis not present

## 2020-02-02 DIAGNOSIS — E1129 Type 2 diabetes mellitus with other diabetic kidney complication: Secondary | ICD-10-CM | POA: Diagnosis not present

## 2020-02-02 DIAGNOSIS — I4891 Unspecified atrial fibrillation: Secondary | ICD-10-CM | POA: Diagnosis not present

## 2020-02-03 DIAGNOSIS — D631 Anemia in chronic kidney disease: Secondary | ICD-10-CM | POA: Diagnosis not present

## 2020-02-03 DIAGNOSIS — Z992 Dependence on renal dialysis: Secondary | ICD-10-CM | POA: Diagnosis not present

## 2020-02-03 DIAGNOSIS — N2581 Secondary hyperparathyroidism of renal origin: Secondary | ICD-10-CM | POA: Diagnosis not present

## 2020-02-03 DIAGNOSIS — N186 End stage renal disease: Secondary | ICD-10-CM | POA: Diagnosis not present

## 2020-02-03 DIAGNOSIS — D509 Iron deficiency anemia, unspecified: Secondary | ICD-10-CM | POA: Diagnosis not present

## 2020-02-06 DIAGNOSIS — Z992 Dependence on renal dialysis: Secondary | ICD-10-CM | POA: Diagnosis not present

## 2020-02-06 DIAGNOSIS — D631 Anemia in chronic kidney disease: Secondary | ICD-10-CM | POA: Diagnosis not present

## 2020-02-06 DIAGNOSIS — D509 Iron deficiency anemia, unspecified: Secondary | ICD-10-CM | POA: Diagnosis not present

## 2020-02-06 DIAGNOSIS — N2581 Secondary hyperparathyroidism of renal origin: Secondary | ICD-10-CM | POA: Diagnosis not present

## 2020-02-06 DIAGNOSIS — N186 End stage renal disease: Secondary | ICD-10-CM | POA: Diagnosis not present

## 2020-02-08 DIAGNOSIS — D631 Anemia in chronic kidney disease: Secondary | ICD-10-CM | POA: Diagnosis not present

## 2020-02-08 DIAGNOSIS — N186 End stage renal disease: Secondary | ICD-10-CM | POA: Diagnosis not present

## 2020-02-08 DIAGNOSIS — Z992 Dependence on renal dialysis: Secondary | ICD-10-CM | POA: Diagnosis not present

## 2020-02-08 DIAGNOSIS — D509 Iron deficiency anemia, unspecified: Secondary | ICD-10-CM | POA: Diagnosis not present

## 2020-02-08 DIAGNOSIS — N2581 Secondary hyperparathyroidism of renal origin: Secondary | ICD-10-CM | POA: Diagnosis not present

## 2020-02-10 DIAGNOSIS — D631 Anemia in chronic kidney disease: Secondary | ICD-10-CM | POA: Diagnosis not present

## 2020-02-10 DIAGNOSIS — N2581 Secondary hyperparathyroidism of renal origin: Secondary | ICD-10-CM | POA: Diagnosis not present

## 2020-02-10 DIAGNOSIS — Z992 Dependence on renal dialysis: Secondary | ICD-10-CM | POA: Diagnosis not present

## 2020-02-10 DIAGNOSIS — N186 End stage renal disease: Secondary | ICD-10-CM | POA: Diagnosis not present

## 2020-02-10 DIAGNOSIS — D509 Iron deficiency anemia, unspecified: Secondary | ICD-10-CM | POA: Diagnosis not present

## 2020-02-13 DIAGNOSIS — N2581 Secondary hyperparathyroidism of renal origin: Secondary | ICD-10-CM | POA: Diagnosis not present

## 2020-02-13 DIAGNOSIS — D631 Anemia in chronic kidney disease: Secondary | ICD-10-CM | POA: Diagnosis not present

## 2020-02-13 DIAGNOSIS — Z992 Dependence on renal dialysis: Secondary | ICD-10-CM | POA: Diagnosis not present

## 2020-02-13 DIAGNOSIS — N186 End stage renal disease: Secondary | ICD-10-CM | POA: Diagnosis not present

## 2020-02-13 DIAGNOSIS — D509 Iron deficiency anemia, unspecified: Secondary | ICD-10-CM | POA: Diagnosis not present

## 2020-02-15 DIAGNOSIS — N186 End stage renal disease: Secondary | ICD-10-CM | POA: Diagnosis not present

## 2020-02-15 DIAGNOSIS — D509 Iron deficiency anemia, unspecified: Secondary | ICD-10-CM | POA: Diagnosis not present

## 2020-02-15 DIAGNOSIS — D631 Anemia in chronic kidney disease: Secondary | ICD-10-CM | POA: Diagnosis not present

## 2020-02-15 DIAGNOSIS — N2581 Secondary hyperparathyroidism of renal origin: Secondary | ICD-10-CM | POA: Diagnosis not present

## 2020-02-15 DIAGNOSIS — Z992 Dependence on renal dialysis: Secondary | ICD-10-CM | POA: Diagnosis not present

## 2020-02-17 DIAGNOSIS — N2581 Secondary hyperparathyroidism of renal origin: Secondary | ICD-10-CM | POA: Diagnosis not present

## 2020-02-17 DIAGNOSIS — N186 End stage renal disease: Secondary | ICD-10-CM | POA: Diagnosis not present

## 2020-02-17 DIAGNOSIS — Z992 Dependence on renal dialysis: Secondary | ICD-10-CM | POA: Diagnosis not present

## 2020-02-17 DIAGNOSIS — D509 Iron deficiency anemia, unspecified: Secondary | ICD-10-CM | POA: Diagnosis not present

## 2020-02-17 DIAGNOSIS — D631 Anemia in chronic kidney disease: Secondary | ICD-10-CM | POA: Diagnosis not present

## 2020-02-18 DIAGNOSIS — I10 Essential (primary) hypertension: Secondary | ICD-10-CM | POA: Diagnosis not present

## 2020-02-18 DIAGNOSIS — I480 Paroxysmal atrial fibrillation: Secondary | ICD-10-CM | POA: Diagnosis not present

## 2020-02-19 ENCOUNTER — Other Ambulatory Visit: Payer: Self-pay | Admitting: Cardiology

## 2020-02-19 DIAGNOSIS — N186 End stage renal disease: Secondary | ICD-10-CM | POA: Diagnosis not present

## 2020-02-19 DIAGNOSIS — I158 Other secondary hypertension: Secondary | ICD-10-CM | POA: Diagnosis not present

## 2020-02-19 DIAGNOSIS — Z992 Dependence on renal dialysis: Secondary | ICD-10-CM | POA: Diagnosis not present

## 2020-02-20 DIAGNOSIS — N186 End stage renal disease: Secondary | ICD-10-CM | POA: Diagnosis not present

## 2020-02-20 DIAGNOSIS — D509 Iron deficiency anemia, unspecified: Secondary | ICD-10-CM | POA: Diagnosis not present

## 2020-02-20 DIAGNOSIS — Z992 Dependence on renal dialysis: Secondary | ICD-10-CM | POA: Diagnosis not present

## 2020-02-20 DIAGNOSIS — D631 Anemia in chronic kidney disease: Secondary | ICD-10-CM | POA: Diagnosis not present

## 2020-02-20 DIAGNOSIS — N2581 Secondary hyperparathyroidism of renal origin: Secondary | ICD-10-CM | POA: Diagnosis not present

## 2020-02-21 ENCOUNTER — Other Ambulatory Visit: Payer: Self-pay

## 2020-02-21 ENCOUNTER — Ambulatory Visit: Payer: Medicare Other | Admitting: Pharmacist

## 2020-02-21 DIAGNOSIS — Z5181 Encounter for therapeutic drug level monitoring: Secondary | ICD-10-CM

## 2020-02-21 DIAGNOSIS — Z7901 Long term (current) use of anticoagulants: Secondary | ICD-10-CM | POA: Diagnosis not present

## 2020-02-21 DIAGNOSIS — I480 Paroxysmal atrial fibrillation: Secondary | ICD-10-CM | POA: Diagnosis not present

## 2020-02-21 LAB — POCT INR: INR: 2.7 (ref 2.0–3.0)

## 2020-02-21 NOTE — Patient Instructions (Signed)
INR at goal. Continue taking 7.5 mg every Mon, Wed, Fri and 5 mg all other days. Recheck in 4 weeks.

## 2020-02-21 NOTE — Progress Notes (Signed)
Anticoagulation Management Jane [name] is a 74 y.o. female who reports to the clinic for monitoring of warfarin treatment.    Indication: atrial fibrillation CHA2DS2 Vasc Score 4 (Age >59, female, HTN hx), HAS-BLED 2 (Age>65, renal disease)  Duration: indefinite Supervising physician: Adrian Prows  Anticoagulation Clinic Visit History:  Patient does not report signs/symptoms of bleeding or thromboembolism.  Other recent changes: No change in diet, medications, lifestyle. Improved appetite. Pt has been on ophthalmic antibiotic and steroid doses post cataract surgery. Pt recovering well and denies any post surgery complications. Working with dietitian to improve eating habits and get enough protein from her diet.   Anticoagulation Episode Summary    Current INR goal:  2.0-3.0  TTR:  78.8 % (1.4 y)  Next INR check:  03/20/2020  INR from last check:  2.7 (02/21/2020)  Weekly max warfarin dose:    Target end date:  Indefinite  INR check location:    Preferred lab:    Send INR reminders to:     Indications   Paroxysmal atrial fibrillation (HCC) [I48.0] Monitoring for long-term anticoagulant use [Z51.81 Z79.01]       Comments:          Allergies  Allergen Reactions  . Latex Rash  . Penicillins     Yeast infection   . Sulfa Antibiotics Rash  . Tape Other (See Comments)    Plastic, silicone, and paper tape causes bruising and pulls off skin. Cloth tape works fine    Current Outpatient Medications:  .  ciprofloxacin (CILOXAN) 0.3 % ophthalmic solution, Place into both eyes., Disp: , Rfl:  .  hydrALAZINE (APRESOLINE) 10 MG tablet, Take 10 mg by mouth 3 (three) times daily., Disp: , Rfl:  .  acetaminophen (TYLENOL) 500 MG tablet, Take 1,500 mg by mouth daily as needed for headache., Disp: , Rfl:  .  albuterol (PROVENTIL HFA;VENTOLIN HFA) 108 (90 Base) MCG/ACT inhaler, Inhale 1 puff into the lungs every 6 (six) hours as needed for wheezing or shortness of breath. (Patient taking  differently: Inhale 2 puffs into the lungs every 6 (six) hours as needed for wheezing or shortness of breath. ), Disp: 6.7 g, Rfl: 2 .  BESIVANCE 0.6 % SUSP, Place 1 drop into the right eye 3 (three) times daily., Disp: , Rfl:  .  carvedilol (COREG) 12.5 MG tablet, TAKE 1 TABLET BY MOUTH AS NEEDED (IF  BP  IS  ABOVE  170), Disp: 60 tablet, Rfl: 0 .  carvedilol (COREG) 25 MG tablet, Take 0.5 tablets (12.5 mg total) by mouth 2 (two) times daily with a meal., Disp: 60 tablet, Rfl: 0 .  cinacalcet (SENSIPAR) 60 MG tablet, Take 60 mg by mouth every Monday, Wednesday, and Friday. , Disp: , Rfl:  .  ethyl chloride spray, Apply 1 application topically daily as needed (port access). , Disp: , Rfl: 12 .  isosorbide dinitrate (ISORDIL) 10 MG tablet, Take 2 tablets (20 mg total) by mouth 2 (two) times daily., Disp: , Rfl:  .  Methoxy PEG-Epoetin Beta (MIRCERA IJ), IV with dialysis, Disp: , Rfl:  .  multivitamin (RENA-VIT) TABS tablet, Take 1 tablet by mouth at bedtime. , Disp: , Rfl:  .  nystatin cream (MYCOSTATIN), Apply 1 application topically 2 (two) times daily. (Patient taking differently: Apply 1 application topically 2 (two) times daily as needed (rash). ), Disp: 45 g, Rfl: 0 .  omeprazole (PRILOSEC) 40 MG capsule, Take 1 capsule (40 mg total) by mouth 2 (two) times daily before  a meal., Disp: 60 capsule, Rfl: 2 .  polyethylene glycol (MIRALAX / GLYCOLAX) 17 g packet, Take 17 g by mouth in the morning, at noon, and at bedtime., Disp: , Rfl:  .  PROLENSA 0.07 % SOLN, Place 1 drop into the right eye every evening., Disp: , Rfl:  .  senna (SENOKOT) 8.6 MG tablet, Take 3 tablets by mouth 2 (two) times daily., Disp: , Rfl:  .  sevelamer carbonate (RENVELA) 800 MG tablet, Take 1,600-2,400 mg by mouth See admin instructions. Take Take 1600 to 2400 mg with each meal, Disp: , Rfl:  .  triamcinolone cream (KENALOG) 0.1 %, Apply 1 application topically 2 (two) times daily., Disp: 45 g, Rfl: 0 .  warfarin (COUMADIN)  5 MG tablet, As directed by coumadin clinic., Disp: 113 tablet, Rfl: 2 Past Medical History:  Diagnosis Date  . Acid reflux   . Anemia of chronic disease   . Arthritis   . Asthma   . Atrial fibrillation ([place])   . Bilateral carotid bruits   . Complication of anesthesia    "hard to wake up, I have sleep apnea" no CPAP  . Depression   . Diverticulitis   . Dysrhythmia    Afib  . ESRD (end stage renal disease) ([place])    MWF [place]  . Headache   . History of blood transfusion   . Hypertension   . Malaise and fatigue   . Orthostatic hypotension   . Shortness of breath    " when I walk to fast"  . Sleep apnea   . Syncope   . Tubulovillous adenoma of colon    ASSESSMENT  Recent Results: The most recent result is correlated with 42.5 mg per week:  Lab Results  Component Value Date   INR 2.7 02/21/2020   INR 2.5 01/31/2020   INR 2.1 01/24/2020    Anticoagulation Dosing: Description   INR at goal. Continue taking 7.5 mg every Mon, Wed, Fri and 5 mg all other days. Recheck in 4 weeks.      INR today: Therapeutic. Pt continues to remain therapeutic on current dose. Denies any recent bleeding/bruising symptoms. Denies any recent changes in diet, medications, lifestyle. Pt has been stable on the current dose post hospitalization.   PLAN Weekly dose was unchanged. Continue taking 7.5 mg every Mon, Wed, Fri and 5 mg all other days. Recheck INR in 3 weeks.     Patient Instructions  INR at goal. Continue taking 7.5 mg every Mon, Wed, Fri and 5 mg all other days. Recheck in 4 weeks.  Patient advised to contact clinic or seek medical attention if signs/symptoms of bleeding or thromboembolism occur.  Patient verbalized understanding by repeating back information and was advised to contact me if further medication-related questions arise.   Follow-up Return in about 4 weeks (around 03/20/2020).  [name], PharmD  15 minutes spent face-to-face with the patient during  the encounter. 50% of time spent on education, including signs/sx bleeding and clotting, as well as food and drug interactions with warfarin. 50% of time was spent on fingerprick POC INR sample collection,processing, results determination, and documentation

## 2020-02-22 DIAGNOSIS — N186 End stage renal disease: Secondary | ICD-10-CM | POA: Diagnosis not present

## 2020-02-22 DIAGNOSIS — Z992 Dependence on renal dialysis: Secondary | ICD-10-CM | POA: Diagnosis not present

## 2020-02-22 DIAGNOSIS — D631 Anemia in chronic kidney disease: Secondary | ICD-10-CM | POA: Diagnosis not present

## 2020-02-22 DIAGNOSIS — D509 Iron deficiency anemia, unspecified: Secondary | ICD-10-CM | POA: Diagnosis not present

## 2020-02-22 DIAGNOSIS — N2581 Secondary hyperparathyroidism of renal origin: Secondary | ICD-10-CM | POA: Diagnosis not present

## 2020-02-24 DIAGNOSIS — D509 Iron deficiency anemia, unspecified: Secondary | ICD-10-CM | POA: Diagnosis not present

## 2020-02-24 DIAGNOSIS — N186 End stage renal disease: Secondary | ICD-10-CM | POA: Diagnosis not present

## 2020-02-24 DIAGNOSIS — Z992 Dependence on renal dialysis: Secondary | ICD-10-CM | POA: Diagnosis not present

## 2020-02-24 DIAGNOSIS — N2581 Secondary hyperparathyroidism of renal origin: Secondary | ICD-10-CM | POA: Diagnosis not present

## 2020-02-24 DIAGNOSIS — D631 Anemia in chronic kidney disease: Secondary | ICD-10-CM | POA: Diagnosis not present

## 2020-02-27 DIAGNOSIS — N2581 Secondary hyperparathyroidism of renal origin: Secondary | ICD-10-CM | POA: Diagnosis not present

## 2020-02-27 DIAGNOSIS — N186 End stage renal disease: Secondary | ICD-10-CM | POA: Diagnosis not present

## 2020-02-27 DIAGNOSIS — D509 Iron deficiency anemia, unspecified: Secondary | ICD-10-CM | POA: Diagnosis not present

## 2020-02-27 DIAGNOSIS — D631 Anemia in chronic kidney disease: Secondary | ICD-10-CM | POA: Diagnosis not present

## 2020-02-27 DIAGNOSIS — Z992 Dependence on renal dialysis: Secondary | ICD-10-CM | POA: Diagnosis not present

## 2020-02-29 DIAGNOSIS — D631 Anemia in chronic kidney disease: Secondary | ICD-10-CM | POA: Diagnosis not present

## 2020-02-29 DIAGNOSIS — I4891 Unspecified atrial fibrillation: Secondary | ICD-10-CM | POA: Diagnosis not present

## 2020-02-29 DIAGNOSIS — Z992 Dependence on renal dialysis: Secondary | ICD-10-CM | POA: Diagnosis not present

## 2020-02-29 DIAGNOSIS — N186 End stage renal disease: Secondary | ICD-10-CM | POA: Diagnosis not present

## 2020-02-29 DIAGNOSIS — D509 Iron deficiency anemia, unspecified: Secondary | ICD-10-CM | POA: Diagnosis not present

## 2020-02-29 DIAGNOSIS — N2581 Secondary hyperparathyroidism of renal origin: Secondary | ICD-10-CM | POA: Diagnosis not present

## 2020-03-02 ENCOUNTER — Encounter: Payer: Self-pay | Admitting: *Deleted

## 2020-03-02 DIAGNOSIS — N186 End stage renal disease: Secondary | ICD-10-CM | POA: Diagnosis not present

## 2020-03-02 DIAGNOSIS — Z992 Dependence on renal dialysis: Secondary | ICD-10-CM | POA: Diagnosis not present

## 2020-03-02 DIAGNOSIS — N2581 Secondary hyperparathyroidism of renal origin: Secondary | ICD-10-CM | POA: Diagnosis not present

## 2020-03-02 DIAGNOSIS — D631 Anemia in chronic kidney disease: Secondary | ICD-10-CM | POA: Diagnosis not present

## 2020-03-02 DIAGNOSIS — D509 Iron deficiency anemia, unspecified: Secondary | ICD-10-CM | POA: Diagnosis not present

## 2020-03-05 DIAGNOSIS — N2581 Secondary hyperparathyroidism of renal origin: Secondary | ICD-10-CM | POA: Diagnosis not present

## 2020-03-05 DIAGNOSIS — D631 Anemia in chronic kidney disease: Secondary | ICD-10-CM | POA: Diagnosis not present

## 2020-03-05 DIAGNOSIS — D509 Iron deficiency anemia, unspecified: Secondary | ICD-10-CM | POA: Diagnosis not present

## 2020-03-05 DIAGNOSIS — Z992 Dependence on renal dialysis: Secondary | ICD-10-CM | POA: Diagnosis not present

## 2020-03-05 DIAGNOSIS — N186 End stage renal disease: Secondary | ICD-10-CM | POA: Diagnosis not present

## 2020-03-07 DIAGNOSIS — D631 Anemia in chronic kidney disease: Secondary | ICD-10-CM | POA: Diagnosis not present

## 2020-03-07 DIAGNOSIS — N186 End stage renal disease: Secondary | ICD-10-CM | POA: Diagnosis not present

## 2020-03-07 DIAGNOSIS — D509 Iron deficiency anemia, unspecified: Secondary | ICD-10-CM | POA: Diagnosis not present

## 2020-03-07 DIAGNOSIS — N2581 Secondary hyperparathyroidism of renal origin: Secondary | ICD-10-CM | POA: Diagnosis not present

## 2020-03-07 DIAGNOSIS — Z992 Dependence on renal dialysis: Secondary | ICD-10-CM | POA: Diagnosis not present

## 2020-03-09 DIAGNOSIS — Z992 Dependence on renal dialysis: Secondary | ICD-10-CM | POA: Diagnosis not present

## 2020-03-09 DIAGNOSIS — N186 End stage renal disease: Secondary | ICD-10-CM | POA: Diagnosis not present

## 2020-03-09 DIAGNOSIS — D631 Anemia in chronic kidney disease: Secondary | ICD-10-CM | POA: Diagnosis not present

## 2020-03-09 DIAGNOSIS — N2581 Secondary hyperparathyroidism of renal origin: Secondary | ICD-10-CM | POA: Diagnosis not present

## 2020-03-09 DIAGNOSIS — D509 Iron deficiency anemia, unspecified: Secondary | ICD-10-CM | POA: Diagnosis not present

## 2020-03-12 DIAGNOSIS — D631 Anemia in chronic kidney disease: Secondary | ICD-10-CM | POA: Diagnosis not present

## 2020-03-12 DIAGNOSIS — D509 Iron deficiency anemia, unspecified: Secondary | ICD-10-CM | POA: Diagnosis not present

## 2020-03-12 DIAGNOSIS — N2581 Secondary hyperparathyroidism of renal origin: Secondary | ICD-10-CM | POA: Diagnosis not present

## 2020-03-12 DIAGNOSIS — N186 End stage renal disease: Secondary | ICD-10-CM | POA: Diagnosis not present

## 2020-03-12 DIAGNOSIS — Z992 Dependence on renal dialysis: Secondary | ICD-10-CM | POA: Diagnosis not present

## 2020-03-13 DIAGNOSIS — I871 Compression of vein: Secondary | ICD-10-CM | POA: Diagnosis not present

## 2020-03-13 DIAGNOSIS — Z992 Dependence on renal dialysis: Secondary | ICD-10-CM | POA: Diagnosis not present

## 2020-03-13 DIAGNOSIS — N186 End stage renal disease: Secondary | ICD-10-CM | POA: Diagnosis not present

## 2020-03-13 DIAGNOSIS — T82858A Stenosis of vascular prosthetic devices, implants and grafts, initial encounter: Secondary | ICD-10-CM | POA: Diagnosis not present

## 2020-03-14 DIAGNOSIS — D631 Anemia in chronic kidney disease: Secondary | ICD-10-CM | POA: Diagnosis not present

## 2020-03-14 DIAGNOSIS — Z992 Dependence on renal dialysis: Secondary | ICD-10-CM | POA: Diagnosis not present

## 2020-03-14 DIAGNOSIS — N186 End stage renal disease: Secondary | ICD-10-CM | POA: Diagnosis not present

## 2020-03-14 DIAGNOSIS — N2581 Secondary hyperparathyroidism of renal origin: Secondary | ICD-10-CM | POA: Diagnosis not present

## 2020-03-14 DIAGNOSIS — D509 Iron deficiency anemia, unspecified: Secondary | ICD-10-CM | POA: Diagnosis not present

## 2020-03-16 DIAGNOSIS — D631 Anemia in chronic kidney disease: Secondary | ICD-10-CM | POA: Diagnosis not present

## 2020-03-16 DIAGNOSIS — Z992 Dependence on renal dialysis: Secondary | ICD-10-CM | POA: Diagnosis not present

## 2020-03-16 DIAGNOSIS — N2581 Secondary hyperparathyroidism of renal origin: Secondary | ICD-10-CM | POA: Diagnosis not present

## 2020-03-16 DIAGNOSIS — D509 Iron deficiency anemia, unspecified: Secondary | ICD-10-CM | POA: Diagnosis not present

## 2020-03-16 DIAGNOSIS — N186 End stage renal disease: Secondary | ICD-10-CM | POA: Diagnosis not present

## 2020-03-19 DIAGNOSIS — D631 Anemia in chronic kidney disease: Secondary | ICD-10-CM | POA: Diagnosis not present

## 2020-03-19 DIAGNOSIS — Z992 Dependence on renal dialysis: Secondary | ICD-10-CM | POA: Diagnosis not present

## 2020-03-19 DIAGNOSIS — N186 End stage renal disease: Secondary | ICD-10-CM | POA: Diagnosis not present

## 2020-03-19 DIAGNOSIS — D509 Iron deficiency anemia, unspecified: Secondary | ICD-10-CM | POA: Diagnosis not present

## 2020-03-19 DIAGNOSIS — N2581 Secondary hyperparathyroidism of renal origin: Secondary | ICD-10-CM | POA: Diagnosis not present

## 2020-03-20 ENCOUNTER — Ambulatory Visit: Payer: Medicare Other | Admitting: Pharmacist

## 2020-03-20 ENCOUNTER — Other Ambulatory Visit: Payer: Self-pay | Admitting: Pharmacist

## 2020-03-20 ENCOUNTER — Other Ambulatory Visit: Payer: Self-pay

## 2020-03-20 DIAGNOSIS — Z7901 Long term (current) use of anticoagulants: Secondary | ICD-10-CM

## 2020-03-20 DIAGNOSIS — I129 Hypertensive chronic kidney disease with stage 1 through stage 4 chronic kidney disease, or unspecified chronic kidney disease: Secondary | ICD-10-CM

## 2020-03-20 DIAGNOSIS — I10 Essential (primary) hypertension: Secondary | ICD-10-CM | POA: Diagnosis not present

## 2020-03-20 DIAGNOSIS — Z5181 Encounter for therapeutic drug level monitoring: Secondary | ICD-10-CM | POA: Diagnosis not present

## 2020-03-20 DIAGNOSIS — I480 Paroxysmal atrial fibrillation: Secondary | ICD-10-CM

## 2020-03-20 LAB — POCT INR: INR: 2.7 (ref 2.0–3.0)

## 2020-03-20 MED ORDER — ISOSORBIDE DINITRATE 30 MG PO TABS: 30.0000 mg | ORAL_TABLET | Freq: Three times a day (TID) | ORAL | Status: DC

## 2020-03-20 NOTE — Patient Instructions (Signed)
INR at goal. Continue taking 7.5 mg every Mon, Wed, Fri and 5 mg all other days. Recheck in 4 weeks.

## 2020-03-20 NOTE — Progress Notes (Signed)
Anticoagulation Management Jane [name] is a 74 y.o. female who reports to the clinic for monitoring of warfarin treatment.    Indication: atrial fibrillation CHA2DS2 Vasc Score 4 (Age >59, female, HTN hx), HAS-BLED 2 (Age>65, renal disease)  Duration: indefinite Supervising physician: Adrian Prows  Anticoagulation Clinic Visit History:  Patient does not report signs/symptoms of bleeding or thromboembolism.  Other recent changes: No change in diet, medications, lifestyle. Improved appetite.Complains of recent fall while traveling on the bus. Fell on her knees. No associated significant bleeding or bruising symptoms. Denies any head trauma. Complains of mild bleeding from the dialysis port site. Bleeding symptoms  dissolves on its on continued pressure.   Anticoagulation Episode Summary    Current INR goal:  2.0-3.0  TTR:  79.9 % (1.5 y)  Next INR check:  04/17/2020  INR from last check:  2.7 (03/20/2020)  Weekly max warfarin dose:    Target end date:  Indefinite  INR check location:    Preferred lab:    Send INR reminders to:     Indications   Paroxysmal atrial fibrillation (HCC) [I48.0] Monitoring for long-term anticoagulant use [Z51.81 Z79.01]       Comments:          Allergies  Allergen Reactions  . Latex Rash  . Penicillins     Yeast infection   . Sulfa Antibiotics Rash  . Tape Other (See Comments)    Plastic, silicone, and paper tape causes bruising and pulls off skin. Cloth tape works fine    Current Outpatient Medications:  .  acetaminophen (TYLENOL) 500 MG tablet, Take 1,500 mg by mouth daily as needed for headache., Disp: , Rfl:  .  albuterol (PROVENTIL HFA;VENTOLIN HFA) 108 (90 Base) MCG/ACT inhaler, Inhale 1 puff into the lungs every 6 (six) hours as needed for wheezing or shortness of breath. (Patient taking differently: Inhale 2 puffs into the lungs every 6 (six) hours as needed for wheezing or shortness of breath. ), Disp: 6.7 g, Rfl: 2 .  BESIVANCE 0.6 % SUSP,  Place 1 drop into the right eye 3 (three) times daily., Disp: , Rfl:  .  carvedilol (COREG) 12.5 MG tablet, TAKE 1 TABLET BY MOUTH AS NEEDED (IF  BP  IS  ABOVE  170), Disp: 60 tablet, Rfl: 0 .  carvedilol (COREG) 25 MG tablet, Take 0.5 tablets (12.5 mg total) by mouth 2 (two) times daily with a meal., Disp: 60 tablet, Rfl: 0 .  cinacalcet (SENSIPAR) 60 MG tablet, Take 60 mg by mouth every Monday, Wednesday, and Friday. , Disp: , Rfl:  .  ciprofloxacin (CILOXAN) 0.3 % ophthalmic solution, Place into both eyes., Disp: , Rfl:  .  ethyl chloride spray, Apply 1 application topically daily as needed (port access). , Disp: , Rfl: 12 .  hydrALAZINE (APRESOLINE) 10 MG tablet, Take 10 mg by mouth 3 (three) times daily., Disp: , Rfl:  .  isosorbide dinitrate (ISORDIL) 10 MG tablet, Take 2 tablets (20 mg total) by mouth 2 (two) times daily., Disp: , Rfl:  .  Methoxy PEG-Epoetin Beta (MIRCERA IJ), IV with dialysis, Disp: , Rfl:  .  multivitamin (RENA-VIT) TABS tablet, Take 1 tablet by mouth at bedtime. , Disp: , Rfl:  .  nystatin cream (MYCOSTATIN), Apply 1 application topically 2 (two) times daily. (Patient taking differently: Apply 1 application topically 2 (two) times daily as needed (rash). ), Disp: 45 g, Rfl: 0 .  omeprazole (PRILOSEC) 40 MG capsule, Take 1 capsule (40 mg total) by  mouth 2 (two) times daily before a meal., Disp: 60 capsule, Rfl: 2 .  polyethylene glycol (MIRALAX / GLYCOLAX) 17 g packet, Take 17 g by mouth in the morning, at noon, and at bedtime., Disp: , Rfl:  .  PROLENSA 0.07 % SOLN, Place 1 drop into the right eye every evening., Disp: , Rfl:  .  senna (SENOKOT) 8.6 MG tablet, Take 3 tablets by mouth 2 (two) times daily., Disp: , Rfl:  .  sevelamer carbonate (RENVELA) 800 MG tablet, Take 1,600-2,400 mg by mouth See admin instructions. Take Take 1600 to 2400 mg with each meal, Disp: , Rfl:  .  triamcinolone cream (KENALOG) 0.1 %, Apply 1 application topically 2 (two) times daily., Disp: 45  g, Rfl: 0 .  warfarin (COUMADIN) 5 MG tablet, As directed by coumadin clinic. (Patient taking differently: As directed by coumadin clinic. Current dose of 7.5 mg every Mon, Wed, Fri; 5 mg all other days), Disp: 113 tablet, Rfl: 2 Past Medical History:  Diagnosis Date  . Acid reflux   . Anemia of chronic disease   . Arthritis   . Asthma   . Atrial fibrillation ([place])   . Bilateral carotid bruits   . Complication of anesthesia    "hard to wake up, I have sleep apnea" no CPAP  . Depression   . Diverticulitis   . Duodenal ulcer   . Dysrhythmia    Afib  . ESRD (end stage renal disease) ([place])    MWF [place]  . Headache   . History of blood transfusion   . Hypertension   . Malaise and fatigue   . Orthostatic hypotension   . Shortness of breath    " when I walk to fast"  . Sleep apnea   . Syncope   . Tubulovillous adenoma of colon    ASSESSMENT  Recent Results: The most recent result is correlated with 42.5 mg per week:  Lab Results  Component Value Date   INR 2.7 03/20/2020   INR 2.7 02/21/2020   INR 2.5 01/31/2020    Anticoagulation Dosing: Description   INR at goal. Continue taking 7.5 mg every Mon, Wed, Fri and 5 mg all other days. Recheck in 4 weeks.      INR today: Therapeutic. Pt continues to remain therapeutic on current dose. Denies any significant bleeding/bruising symptoms. Denies any recent changes in diet, medications, lifestyle. Pt has been stable on the current dose post hospitalization.   PLAN Weekly dose was unchanged. Continue taking 7.5 mg every Mon, Wed, Fri and 5 mg all other days. Recheck INR in 4 weeks.     Patient Instructions  INR at goal. Continue taking 7.5 mg every Mon, Wed, Fri and 5 mg all other days. Recheck in 4 weeks.  Patient advised to contact clinic or seek medical attention if signs/symptoms of bleeding or thromboembolism occur.  Patient verbalized understanding by repeating back information and was advised to contact me if  further medication-related questions arise.   Follow-up Return in about 4 weeks (around 04/17/2020).  [name], PharmD  15 minutes spent face-to-face with the patient during the encounter. 50% of time spent on education, including signs/sx bleeding and clotting, as well as food and drug interactions with warfarin. 50% of time was spent on fingerprick POC INR sample collection,processing, results determination, and documentation

## 2020-03-21 DIAGNOSIS — N2581 Secondary hyperparathyroidism of renal origin: Secondary | ICD-10-CM | POA: Diagnosis not present

## 2020-03-21 DIAGNOSIS — Z992 Dependence on renal dialysis: Secondary | ICD-10-CM | POA: Diagnosis not present

## 2020-03-21 DIAGNOSIS — D509 Iron deficiency anemia, unspecified: Secondary | ICD-10-CM | POA: Diagnosis not present

## 2020-03-21 DIAGNOSIS — I158 Other secondary hypertension: Secondary | ICD-10-CM | POA: Diagnosis not present

## 2020-03-21 DIAGNOSIS — N186 End stage renal disease: Secondary | ICD-10-CM | POA: Diagnosis not present

## 2020-03-23 DIAGNOSIS — N2581 Secondary hyperparathyroidism of renal origin: Secondary | ICD-10-CM | POA: Diagnosis not present

## 2020-03-23 DIAGNOSIS — D509 Iron deficiency anemia, unspecified: Secondary | ICD-10-CM | POA: Diagnosis not present

## 2020-03-23 DIAGNOSIS — Z992 Dependence on renal dialysis: Secondary | ICD-10-CM | POA: Diagnosis not present

## 2020-03-23 DIAGNOSIS — N186 End stage renal disease: Secondary | ICD-10-CM | POA: Diagnosis not present

## 2020-03-24 ENCOUNTER — Other Ambulatory Visit: Payer: Self-pay | Admitting: Cardiology

## 2020-03-24 DIAGNOSIS — I129 Hypertensive chronic kidney disease with stage 1 through stage 4 chronic kidney disease, or unspecified chronic kidney disease: Secondary | ICD-10-CM

## 2020-03-26 DIAGNOSIS — N186 End stage renal disease: Secondary | ICD-10-CM | POA: Diagnosis not present

## 2020-03-26 DIAGNOSIS — Z992 Dependence on renal dialysis: Secondary | ICD-10-CM | POA: Diagnosis not present

## 2020-03-26 DIAGNOSIS — D509 Iron deficiency anemia, unspecified: Secondary | ICD-10-CM | POA: Diagnosis not present

## 2020-03-26 DIAGNOSIS — N2581 Secondary hyperparathyroidism of renal origin: Secondary | ICD-10-CM | POA: Diagnosis not present

## 2020-03-27 ENCOUNTER — Encounter: Payer: Self-pay | Admitting: Family Medicine

## 2020-03-27 ENCOUNTER — Other Ambulatory Visit: Payer: Self-pay

## 2020-03-27 ENCOUNTER — Ambulatory Visit (INDEPENDENT_AMBULATORY_CARE_PROVIDER_SITE_OTHER): Payer: Medicare Other | Admitting: Family Medicine

## 2020-03-27 VITALS — BP 118/60 | HR 67 | Temp 98.2°F | Resp 16 | Ht 65.0 in | Wt 229.0 lb

## 2020-03-27 DIAGNOSIS — K5900 Constipation, unspecified: Secondary | ICD-10-CM

## 2020-03-27 DIAGNOSIS — G894 Chronic pain syndrome: Secondary | ICD-10-CM | POA: Diagnosis not present

## 2020-03-27 DIAGNOSIS — I129 Hypertensive chronic kidney disease with stage 1 through stage 4 chronic kidney disease, or unspecified chronic kidney disease: Secondary | ICD-10-CM

## 2020-03-27 DIAGNOSIS — M159 Polyosteoarthritis, unspecified: Secondary | ICD-10-CM

## 2020-03-27 DIAGNOSIS — F331 Major depressive disorder, recurrent, moderate: Secondary | ICD-10-CM | POA: Diagnosis not present

## 2020-03-27 DIAGNOSIS — D638 Anemia in other chronic diseases classified elsewhere: Secondary | ICD-10-CM | POA: Diagnosis not present

## 2020-03-27 MED ORDER — DULOXETINE HCL 20 MG PO CPEP: 20.0000 mg | ORAL_CAPSULE | Freq: Every day | ORAL | 1 refills | Status: DC

## 2020-03-27 MED ORDER — ISOSORBIDE DINITRATE 30 MG PO TABS: 30.0000 mg | ORAL_TABLET | Freq: Three times a day (TID) | ORAL | 2 refills | Status: DC

## 2020-03-27 MED ORDER — HYDROCODONE-ACETAMINOPHEN 5-325 MG PO TABS: 1.0000 | ORAL_TABLET | Freq: Every evening | ORAL | 0 refills | Status: DC | PRN

## 2020-03-27 NOTE — Progress Notes (Signed)
HPI: Jane [name] is a 74 y.o. female, who is here today for chronic disease management.   She was last seen on 08/02/19, virtual visit.   Since her last visit she had surgery, complications, hospitalized for a week, s/p RBC's transfusion x3. She was hospitalized from 01/12/2020 to 01/19/2020 due to acute GI bleed.  Anemia: On admission hemoglobin was 6.7. GI bleed. EGD on 01/13/20:Segmental moderate inflammation with hemorrhoids characterized by erosion, erythema, friability, and granularity was found in the cardia in the gastric body. Polypectomy x 9. Takes IV iron 3 times per week.  Lab Results  Component Value Date   WBC 6.0 01/19/2020   HGB 8.5 (L) 01/19/2020   HCT 27.1 (L) 01/19/2020   MCV 90.9 01/19/2020   PLT 171 01/19/2020   Hypertension: Since her last visit antihypertensive medications have been restarted. Her BP has been "up and down." ESRD on hemodialysis. Still producing urine, voiding 2-3 times per day.  She is on Amlodipine 5 mg, which she has not taken for the past 2 days. She is also on Hydralazine 10 mg tid, Isordil 10 mg 3 tab tid,Cofreg 12.5 mg bid prn.  She has not taken Isosorbide for 2 weeks,BP has not been high during this time.  She is not longer on midodrine for orthostatic hypotension.  She is not longer having dizziness upon getting up. Negative for severe/frequent headache, visual changes, chest pain, dyspnea, palpitation, focal weakness, or unusual edema.  Atrial fibrillation she is on Coumadin, INR is checked at her cardiologist office. Next appt with Dr Einar Gip next week.  Lab Results  Component Value Date   CREATININE 9.19 (H) 01/18/2020   BUN 25 (H) 01/18/2020   NA 137 01/18/2020   K 3.7 01/18/2020   CL 99 01/18/2020   CO2 26 01/18/2020   Generalized OA: She is having problems with her back and knees. Also affected IP fingers and toes.  Back pain is not radiated. She received epidural injection in 2018. Intra-articular knee  injection 2015. S/p left total knee replacement.  Negative for saddle anesthesia or lower extremity numbness/tingling.  Tylenol arthritis is not helping. It is limiting daily activities and affecting sleep. She does not drive.  Pain is exacerbated by cold weather and by movement after prolong rest. + Stiffness. This problem is chronic. When she was living in [place] she was following with pain management, she does not remember what medication she was taking.  She has had a few falls, "too many to count."   Negative for head trauma or serious harm. She uses a walker at home.  Depression: Problem has improved over time. Currently she is not on pharmacologic treatment. Problem is aggravated by health issues.  Depression screen [facility] 2/9 03/27/2020 03/27/2020 07/28/2019 09/25/2018 05/11/2018  Decreased Interest 1 1 1 1  0  Down, Depressed, Hopeless 0 0 2 1 0  PHQ - 2 Score 1 1 3 2  0  Altered sleeping 3 3 2 3  -  Tired, decreased energy 3 3 2 3  -  Change in appetite 1 - 2 3 -  Feeling bad or failure about yourself  0 - 0 0 -  Trouble concentrating 0 - 0 1 -  Moving slowly or fidgety/restless 0 - 0 0 -  Suicidal thoughts 0 - 0 1 -  PHQ-9 Score 8 7 9 13  -  Difficult doing work/chores Not difficult at all - Somewhat difficult Somewhat difficult -    Constipation: She is not longer on Linzess, insurance is  not covering it. Miralax tid and Senakot, still has to use suppositories. Still having constipation. Daily but still hard. No blood in stool. Last bowel movement today.  Review of Systems  Constitutional: Positive for fatigue. Negative for activity change, appetite change and fever.  HENT: Negative for mouth sores, nosebleeds and sore throat.   Respiratory: Negative for cough and wheezing.   Gastrointestinal: Negative for abdominal pain, nausea and vomiting.  Genitourinary: Negative for decreased urine volume, dysuria and hematuria.  Musculoskeletal: Positive for arthralgias, back  pain and gait problem.  Skin: Negative for rash.  Neurological: Negative for syncope and facial asymmetry.  Psychiatric/Behavioral: Negative for confusion and hallucinations.  Rest of ROS, see pertinent positives sand negatives in HPI  Current Outpatient Medications on File Prior to Visit  Medication Sig Dispense Refill  . acetaminophen (TYLENOL) 500 MG tablet Take 1,500 mg by mouth daily as needed for headache.    . albuterol (PROVENTIL HFA;VENTOLIN HFA) 108 (90 Base) MCG/ACT inhaler Inhale 1 puff into the lungs every 6 (six) hours as needed for wheezing or shortness of breath. (Patient taking differently: Inhale 2 puffs into the lungs every 6 (six) hours as needed for wheezing or shortness of breath. ) 6.7 g 2  . amLODipine (NORVASC) 5 MG tablet Take 5 mg by mouth daily.    . carvedilol (COREG) 12.5 MG tablet TAKE 1 TABLET BY MOUTH AS NEEDED (IF  BP  IS  ABOVE  170) 60 tablet 0  . ethyl chloride spray Apply 1 application topically daily as needed (port access).   12  . hydrALAZINE (APRESOLINE) 10 MG tablet Take 10 mg by mouth 3 (three) times daily.    . isosorbide dinitrate (ISORDIL) 30 MG tablet Take 1 tablet (30 mg total) by mouth 3 (three) times daily.    . Methoxy PEG-Epoetin Beta (MIRCERA IJ) IV with dialysis    . multivitamin (RENA-VIT) TABS tablet Take 1 tablet by mouth at bedtime.     Marland Kitchen nystatin cream (MYCOSTATIN) Apply 1 application topically 2 (two) times daily. (Patient taking differently: Apply 1 application topically 2 (two) times daily as needed (rash). ) 45 g 0  . omeprazole (PRILOSEC) 40 MG capsule Take 1 capsule (40 mg total) by mouth 2 (two) times daily before a meal. 60 capsule 2  . polyethylene glycol (MIRALAX / GLYCOLAX) 17 g packet Take 17 g by mouth in the morning, at noon, and at bedtime.    . senna (SENOKOT) 8.6 MG tablet Take 3 tablets by mouth 2 (two) times daily.    . sevelamer carbonate (RENVELA) 800 MG tablet Take 1,600-2,400 mg by mouth See admin instructions.  Take Take 1600 to 2400 mg with each meal    . triamcinolone cream (KENALOG) 0.1 % Apply 1 application topically 2 (two) times daily. 45 g 0  . warfarin (COUMADIN) 5 MG tablet As directed by coumadin clinic. (Patient taking differently: As directed by coumadin clinic. Current dose of 7.5 mg every Mon, Wed, Fri; 5 mg all other days) 113 tablet 2   No current facility-administered medications on file prior to visit.     Past Medical History:  Diagnosis Date  . Acid reflux   . Anemia of chronic disease   . Arthritis   . Asthma   . Atrial fibrillation ([place])   . Bilateral carotid bruits   . Complication of anesthesia    "hard to wake up, I have sleep apnea" no CPAP  . Depression   . Diverticulitis   .  Duodenal ulcer   . Dysrhythmia    Afib  . ESRD (end stage renal disease) ([place])    MWF [place]  . Headache   . History of blood transfusion   . Hypertension   . Malaise and fatigue   . Orthostatic hypotension   . Shortness of breath    " when I walk to fast"  . Sleep apnea   . Syncope   . Tubulovillous adenoma of colon    Allergies  Allergen Reactions  . Latex Rash  . Penicillins     Yeast infection   . Sulfa Antibiotics Rash  . Tape Other (See Comments)    Plastic, silicone, and paper tape causes bruising and pulls off skin. Cloth tape works fine    Social History   Socioeconomic History  . Marital status: Divorced    Spouse name: Not on file  . Number of children: 4  . Years of education: 89  . Highest education level: Associate degree: occupational, Hotel manager, or vocational program  Occupational History  . Occupation: Retired  Tobacco Use  . Smoking status: Never Smoker  . Smokeless tobacco: Never Used  Vaping Use  . Vaping Use: Never used  Substance and Sexual Activity  . Alcohol use: No  . Drug use: No  . Sexual activity: Never  Other Topics Concern  . Not on file  Social History Narrative   HH 1   Divorced   Outpatient dialysis Mon, Wed, Fri     4 children: 1 daughter locally is an Designer, multimedia and 3 sons in [place] Strain: High Risk  . Difficulty of Paying Living Expenses: Very hard  Food Insecurity: No Food Insecurity  . Worried About Charity fundraiser in the Last Year: Never true  . Ran Out of Food in the Last Year: Never true  Transportation Needs: No Transportation Needs  . Lack of Transportation (Medical): No  . Lack of Transportation (Non-Medical): No  Physical Activity:   . Days of Exercise per Week: Not on file  . Minutes of Exercise per Session: Not on file  Stress: Stress Concern Present  . Feeling of Stress : Rather much  Social Connections: Moderately Integrated  . Frequency of Communication with Friends and Family: More than three times a week  . Frequency of Social Gatherings with Friends and Family: Once a week  . Attends Religious Services: More than 4 times per year  . Active Member of Clubs or Organizations: Yes  . Attends Archivist Meetings: Not on file  . Marital Status: Divorced    Vitals:   03/27/20 1128  BP: 118/60  Pulse: 67  Resp: 16  Temp: 98.2 F (36.8 C)  SpO2: 95%   Body mass index is 38.11 kg/m.  Physical Exam Vitals and nursing note reviewed.  Constitutional:      General: She is not in acute distress.    Appearance: She is well-developed.  HENT:     Head: Normocephalic and atraumatic.  Eyes:     Conjunctiva/sclera: Conjunctivae normal.     Pupils: Pupils are equal, round, and reactive to light.  Cardiovascular:     Rate and Rhythm: Normal rate and regular rhythm.     Heart sounds: Murmur (I-II/VI LUSB and RUSB) heard.      Comments: DP pulses present. Pulmonary:     Effort: Pulmonary effort is normal. No respiratory distress.     Breath sounds:  Normal breath sounds.  Abdominal:     Palpations: Abdomen is soft. There is no mass.     Tenderness: There is no abdominal tenderness.  Musculoskeletal:      Lumbar back: No tenderness or bony tenderness. Negative right straight leg raise test and negative left straight leg raise test.     Right knee: No effusion or erythema. Decreased range of motion (mild). Tenderness present.     Left knee: No effusion or erythema. Tenderness present.     Comments: Knee crepitus bilateral. Tenderness upon palpation of intra-articular joint medial and lateral.  Lymphadenopathy:     Cervical: No cervical adenopathy.  Skin:    General: Skin is warm.     Findings: No erythema or rash.  Neurological:     Mental Status: She is alert and oriented to person, place, and time.     Cranial Nerves: No cranial nerve deficit.     Comments: Some difficulty standing up. Otherwise stable gait, not assisted.  Psychiatric:        Mood and Affect: Affect normal. Mood is anxious.     Comments: Well groomed, good eye contact.    ASSESSMENT AND PLAN:  Jane [name] was seen today for chronic disease management.  No orders of the defined types were placed in this encounter.  Hypertension with renal disease Today BP on lower normal range. No changes in current management. Continue monitoring BP regularly. Has appt with cardio next week.  Generalized osteoarthritis of multiple sites Affecting ADL's and sleep. After discussion of some side effects, she agrees on starting Cymbalta 20 mg daily and Hydrocodone-Acetaminophen 5-325 mg at bedtime. Tylenol 500 mg up to 4 tabs daily (1000 mg am and noon). Fall precautions.  She was also instructed to let nephrologist and Coumadin clinic provider know about new medications.  Chronic pain disorder She is familiar with current guidelines in regard to chronic opioid for pain management. We will sign med contract next visit.  Constipation Problem improved but still not well controlled. Planning on meeting with her GI to discuss other treatment options. She understands that opioids could try constipation. Continue adequate  fiber and fluid intake.  Depression, major, recurrent, moderate ([place]) Problem has improved throughout the years. Low dose Cymbalta was added today to help with generalized OA, it may also help with this problem.  Anemia in other chronic diseases classified elsewhere Combination of iron deficiency + chronic disease anemia. Hemoglobin and hematocrit have been stable. Currently she is on IV iron supplementation 3 times per week. Following with nephrologist and GI.  Spent 40 minutes with pt. during this time history was obtained and documented, examination was performed. Prior lab results and GI procedure reviewed, and plan discussed. Some side effects of medications discussed.  Return in about 4 weeks (around 04/24/2020) for She has an appt..   Zephaniah Lubrano G. [name], MD  [facility]. [place] office.   A few things to remember from today's visit:   Chronic pain disorder - Plan: HYDROcodone-acetaminophen (NORCO/VICODIN) 5-325 MG tablet  Depression, major, recurrent, moderate (HCC), Chronic  Generalized osteoarthritis of multiple sites - Plan: DULoxetine (CYMBALTA) 20 MG capsule, HYDROcodone-acetaminophen (NORCO/VICODIN) 5-325 MG tablet  Constipation, unspecified constipation type  Hypertension with renal disease  Today we started 2 medications, Cymbalta and Hydrocodone for joint pain. Continue Tylenol 500 mg up to 4 tabs daily.  Fall precautions. Continue monitoring blood pressure.  If you need refills please call your pharmacy. Do not use My Chart to request refills  or for acute issues that need immediate attention.    Please be sure medication list is accurate. If a new problem present, please set up appointment sooner than planned today.

## 2020-03-27 NOTE — Patient Instructions (Signed)
A few things to remember from today's visit:   Chronic pain disorder - Plan: HYDROcodone-acetaminophen (NORCO/VICODIN) 5-325 MG tablet  Depression, major, recurrent, moderate (HCC), Chronic  Generalized osteoarthritis of multiple sites - Plan: DULoxetine (CYMBALTA) 20 MG capsule, HYDROcodone-acetaminophen (NORCO/VICODIN) 5-325 MG tablet  Constipation, unspecified constipation type  Hypertension with renal disease  Today we started 2 medications, Cymbalta and Hydrocodone for joint pain. Continue Tylenol 500 mg up to 4 tabs daily.  Fall precautions. Continue monitoring blood pressure.  If you need refills please call your pharmacy. Do not use My Chart to request refills or for acute issues that need immediate attention.    Please be sure medication list is accurate. If a new problem present, please set up appointment sooner than planned today.

## 2020-03-27 NOTE — Assessment & Plan Note (Addendum)
Combination of iron deficiency + chronic disease anemia. Hemoglobin and hematocrit have been stable. Currently she is on IV iron supplementation 3 times per week. Following with nephrologist and GI.

## 2020-03-27 NOTE — Assessment & Plan Note (Addendum)
Problem improved but still not well controlled. Planning on meeting with her GI to discuss other treatment options. She understands that opioids could try constipation. Continue adequate fiber and fluid intake.

## 2020-03-27 NOTE — Assessment & Plan Note (Addendum)
Affecting ADL's and sleep. After discussion of some side effects, she agrees on starting Cymbalta 20 mg daily and Hydrocodone-Acetaminophen 5-325 mg at bedtime. Tylenol 500 mg up to 4 tabs daily (1000 mg am and noon). Fall precautions.  She was also instructed to let nephrologist and Coumadin clinic provider know about new medications.

## 2020-03-27 NOTE — Assessment & Plan Note (Signed)
Problem has improved throughout the years. Low dose Cymbalta was added today to help with generalized OA, it may also help with this problem.

## 2020-03-27 NOTE — Assessment & Plan Note (Signed)
She is familiar with current guidelines in regard to chronic opioid for pain management. We will sign med contract next visit.

## 2020-03-27 NOTE — Assessment & Plan Note (Signed)
Today BP on lower normal range. No changes in current management. Continue monitoring BP regularly. Has appt with cardio next week.

## 2020-03-28 DIAGNOSIS — D509 Iron deficiency anemia, unspecified: Secondary | ICD-10-CM | POA: Diagnosis not present

## 2020-03-28 DIAGNOSIS — N186 End stage renal disease: Secondary | ICD-10-CM | POA: Diagnosis not present

## 2020-03-28 DIAGNOSIS — N2581 Secondary hyperparathyroidism of renal origin: Secondary | ICD-10-CM | POA: Diagnosis not present

## 2020-03-28 DIAGNOSIS — I4891 Unspecified atrial fibrillation: Secondary | ICD-10-CM | POA: Diagnosis not present

## 2020-03-28 DIAGNOSIS — Z992 Dependence on renal dialysis: Secondary | ICD-10-CM | POA: Diagnosis not present

## 2020-03-30 ENCOUNTER — Other Ambulatory Visit: Payer: Self-pay

## 2020-03-30 DIAGNOSIS — G894 Chronic pain syndrome: Secondary | ICD-10-CM

## 2020-03-30 DIAGNOSIS — Z992 Dependence on renal dialysis: Secondary | ICD-10-CM | POA: Diagnosis not present

## 2020-03-30 DIAGNOSIS — M159 Polyosteoarthritis, unspecified: Secondary | ICD-10-CM

## 2020-03-30 DIAGNOSIS — N2581 Secondary hyperparathyroidism of renal origin: Secondary | ICD-10-CM | POA: Diagnosis not present

## 2020-03-30 DIAGNOSIS — N186 End stage renal disease: Secondary | ICD-10-CM | POA: Diagnosis not present

## 2020-03-30 DIAGNOSIS — D509 Iron deficiency anemia, unspecified: Secondary | ICD-10-CM | POA: Diagnosis not present

## 2020-03-30 NOTE — Telephone Encounter (Signed)
Insurance would only allow pharmacy to fill 5 tablets for the first fill. They need a new Rx for the remaining 25 tabs.

## 2020-04-02 DIAGNOSIS — N2581 Secondary hyperparathyroidism of renal origin: Secondary | ICD-10-CM | POA: Diagnosis not present

## 2020-04-02 DIAGNOSIS — Z992 Dependence on renal dialysis: Secondary | ICD-10-CM | POA: Diagnosis not present

## 2020-04-02 DIAGNOSIS — D509 Iron deficiency anemia, unspecified: Secondary | ICD-10-CM | POA: Diagnosis not present

## 2020-04-02 DIAGNOSIS — N186 End stage renal disease: Secondary | ICD-10-CM | POA: Diagnosis not present

## 2020-04-03 ENCOUNTER — Encounter: Payer: Self-pay | Admitting: Internal Medicine

## 2020-04-03 ENCOUNTER — Ambulatory Visit (INDEPENDENT_AMBULATORY_CARE_PROVIDER_SITE_OTHER): Payer: Medicare Other | Admitting: Internal Medicine

## 2020-04-03 ENCOUNTER — Ambulatory Visit (INDEPENDENT_AMBULATORY_CARE_PROVIDER_SITE_OTHER)
Admission: RE | Admit: 2020-04-03 | Discharge: 2020-04-03 | Disposition: A | Discharge status: Home / Self Care | Payer: Medicare Other | Source: Ambulatory Visit | Attending: Internal Medicine | Admitting: Internal Medicine

## 2020-04-03 ENCOUNTER — Other Ambulatory Visit: Payer: Self-pay

## 2020-04-03 VITALS — BP 128/56 | HR 60 | Ht 65.0 in | Wt 229.4 lb

## 2020-04-03 DIAGNOSIS — K5909 Other constipation: Secondary | ICD-10-CM | POA: Diagnosis not present

## 2020-04-03 DIAGNOSIS — K3189 Other diseases of stomach and duodenum: Secondary | ICD-10-CM | POA: Diagnosis not present

## 2020-04-03 DIAGNOSIS — Z8719 Personal history of other diseases of the digestive system: Secondary | ICD-10-CM

## 2020-04-03 DIAGNOSIS — Z86010 Personal history of colonic polyps: Secondary | ICD-10-CM

## 2020-04-03 DIAGNOSIS — Z9889 Other specified postprocedural states: Secondary | ICD-10-CM | POA: Diagnosis not present

## 2020-04-03 DIAGNOSIS — K219 Gastro-esophageal reflux disease without esophagitis: Secondary | ICD-10-CM | POA: Diagnosis not present

## 2020-04-03 DIAGNOSIS — D372 Neoplasm of uncertain behavior of small intestine: Secondary | ICD-10-CM | POA: Diagnosis not present

## 2020-04-03 MED ORDER — HYDROCODONE-ACETAMINOPHEN 5-325 MG PO TABS: 1.0000 | ORAL_TABLET | Freq: Every evening | ORAL | 0 refills | Status: DC | PRN

## 2020-04-03 MED ORDER — LINACLOTIDE 145 MCG PO CAPS: 145.0000 ug | ORAL_CAPSULE | Freq: Every day | ORAL | 3 refills | Status: DC

## 2020-04-03 NOTE — Progress Notes (Signed)
Subjective:    [name], female    DOB: 02/19/1946, 74 y.o.   MRN: [id number]  HPI Macarena Pinho is a 74 year old female with a past medical history of tubulovillous adenoma of the duodenum status post EMR complicated by post polypectomy hemorrhage requiring repeat EGD and endoscopic control bleeding, history of multiple adenomatous colon polyps, end-stage renal disease, atrial fibrillation on warfarin, sleep apnea, hypertension who is seen for follow-up.  She is here alone today.  She is known to me from prior upper endoscopy and colonoscopy.  Dr. Rush Landmark then performed EUS and EMR of a large tubulovillous adenoma in the duodenum on 01/09/2020.  Then on 01/13/2020 she developed acute upper GI bleeding at the site of the duodenal polypectomy.  Dr. Henrene Pastor performed an upper endoscopy with control of bleeding using epinephrine injection, additional hemostatic clip placement as well as cautery/thermal therapy.  She was in the hospital for nearly a week but bleeding resolved.  She reports that she has slowly returned to normal other than her chronic constipation.  She was considerably weak and tired after her hospitalization as well as had decreased appetite.  Her fatigue has improved and now resolved.  She has needed IV iron several times with hemodialysis.  She reports that she has been told her blood counts are improving and monitor closely at dialysis.  She is continued omeprazole 40 mg twice daily.  Her energy level has also come back to normal.  She continues to have constipation which was previously well treated with Linzess but insurance was no longer covering this.  She has tried over-the-counter Dulcolax, senna which she is now using 4 tablets twice daily and still not very effective.  MiraLAX was no help at all despite being tried for multiple weeks.  She will occasionally use fleets enemas to help her bowel movements begin.  She has not had blood in her stool or melena.   Review of  Systems As per HPI, otherwise negative  Current Medications, Allergies, Past Medical History, Past Surgical History, Family History and Social History were reviewed in [employer] record.     Objective:   Physical Exam BP (!) 128/56   Pulse 60   Ht 5\' 5"  (1.651 m)   Wt 229 lb 6.4 oz (104.1 kg)   BMI 38.17 kg/m  Gen: awake, alert, NAD HEENT: anicteric CV: RRR, no mrg Pulm: CTA b/l Abd: soft, NT/ND, +BS throughout Ext: no c/c/e Neuro: nonfocal      Assessment & Plan:  73 year old female with a past medical history of tubulovillous adenoma of the duodenum status post EMR complicated by post polypectomy hemorrhage requiring repeat EGD and endoscopic control bleeding, history of multiple adenomatous colon polyps, end-stage renal disease, atrial fibrillation on warfarin, sleep apnea, hypertension who is seen for follow-up.   1.  Tubulovillous adenoma of the duodenum/post polypectomy hemorrhage --she has recovered well after having upper GI bleed at the site of EMR for tubulovillous adenoma of the duodenum.  No further evidence of bleeding.  Blood counts and iron studies are being watched closely by her nephrologist at hemodialysis.  I have recommended that she follow-up with Dr. Rush Landmark for surveillance endoscopy per his recommendation --Continue omeprazole 40 mg twice daily --Abdominal x-ray today to see if hemostatic clips remain in place --Surveillance endoscopy per Dr. Rush Landmark  2.  GERD/dyspepsia --the symptoms have resolved with twice daily PPI, after follow-up endoscopy if no need for additional polypectomy, we can likely reduce PPI to once  daily  3.  Chronic constipation --ongoing despite the use of over-the-counter medicines.  She is using high-dose senna and likely too much senna to induce bowel movements at this time.  Linzess worked well for her and this is my recommendation.  We will try to help with getting this covered by her medical  insurance. --Discontinue senna --Resume Linzess 145 mcg daily, samples provided today  4.  History of adenomatous colon polyps --multiple polyps, repeat colonoscopy would be April 2024 for surveillance  30 minutes total spent today including patient facing time, coordination of care, reviewing medical history/procedures/pertinent radiology studies, and documentation of the encounter.

## 2020-04-03 NOTE — Patient Instructions (Signed)
Your provider has requested that you have an abdominal x ray before leaving today. Please go to the basement floor to our Radiology department for the test.  Continue omeprazole 40 mg twice daily.  We have sent the following medications to your pharmacy for you to pick up at your convenience: Linzess 145 mcg (this will replace senna)  If you are age 76 or older, your body mass index should be between 23-30. Your Body mass index is 38.17 kg/m. If this is out of the aforementioned range listed, please consider follow up with your Primary Care Provider.  If you are age 20 or younger, your body mass index should be between 19-25. Your Body mass index is 38.17 kg/m. If this is out of the aformentioned range listed, please consider follow up with your Primary Care Provider.   Due to recent changes in healthcare laws, you may see the results of your imaging and laboratory studies on MyChart before your provider has had a chance to review them.  We understand that in some cases there may be results that are confusing or concerning to you. Not all laboratory results come back in the same time frame and the provider may be waiting for multiple results in order to interpret others.  Please give [place] 48 hours in order for your provider to thoroughly review all the results before contacting the office for clarification of your results.

## 2020-04-04 DIAGNOSIS — N2581 Secondary hyperparathyroidism of renal origin: Secondary | ICD-10-CM | POA: Diagnosis not present

## 2020-04-04 DIAGNOSIS — D509 Iron deficiency anemia, unspecified: Secondary | ICD-10-CM | POA: Diagnosis not present

## 2020-04-04 DIAGNOSIS — Z992 Dependence on renal dialysis: Secondary | ICD-10-CM | POA: Diagnosis not present

## 2020-04-04 DIAGNOSIS — N186 End stage renal disease: Secondary | ICD-10-CM | POA: Diagnosis not present

## 2020-04-05 ENCOUNTER — Ambulatory Visit (INDEPENDENT_AMBULATORY_CARE_PROVIDER_SITE_OTHER): Payer: Medicare Other | Admitting: Physician Assistant

## 2020-04-05 ENCOUNTER — Other Ambulatory Visit: Payer: Self-pay

## 2020-04-05 ENCOUNTER — Ambulatory Visit (HOSPITAL_COMMUNITY)
Admission: RE | Admit: 2020-04-05 | Discharge: 2020-04-05 | Disposition: A | Discharge status: Home / Self Care | Payer: Medicare Other | Source: Ambulatory Visit | Attending: Vascular Surgery | Admitting: Vascular Surgery

## 2020-04-05 VITALS — BP 166/73 | HR 70 | Temp 97.8°F | Resp 16 | Ht 65.0 in | Wt 226.0 lb

## 2020-04-05 DIAGNOSIS — L989 Disorder of the skin and subcutaneous tissue, unspecified: Secondary | ICD-10-CM

## 2020-04-05 DIAGNOSIS — N186 End stage renal disease: Secondary | ICD-10-CM

## 2020-04-05 DIAGNOSIS — Z992 Dependence on renal dialysis: Secondary | ICD-10-CM

## 2020-04-05 NOTE — Progress Notes (Signed)
POST OPERATIVE DIALYSIS ACCESS OFFICE NOTE    CC: Nodule overlying AV fistula  HPI:  This is a 74 y.o. female who is s/p right radiocephalic AV fistula years ago performed in [place].  She is a local resident who presents with a painful, pink-colored nodule overlying her right upper extremity AV fistula. She states this has been present for "some time"...several months.  This was noticed by her dialysis team and she was referred to [facility] for fistulogram.  She said this was performed on August 24 by Dr. Augustin Coupe.  She states this nodule did not appear to be an aneurysm and does not communicate with her fistula.  She tells me she has had previous stents placed in the right upper arm.  Her medical history is significant for atrial fibrillation and she is maintained on Coumadin.  She does have some minor issues with oozing during cannulation.  She denies hand pain, fever or chills.   Dialysis days:  M,W,F   Dialysis center: [facility].  Allergies  Allergen Reactions  . Latex Rash  . Penicillins     Yeast infection   . Sulfa Antibiotics Rash  . Tape Other (See Comments)    Plastic, silicone, and paper tape causes bruising and pulls off skin. Cloth tape works fine    Current Outpatient Medications  Medication Sig Dispense Refill  . acetaminophen (TYLENOL) 500 MG tablet Take 1,500 mg by mouth daily as needed for headache.    . albuterol (PROVENTIL HFA;VENTOLIN HFA) 108 (90 Base) MCG/ACT inhaler Inhale 1 puff into the lungs every 6 (six) hours as needed for wheezing or shortness of breath. (Patient taking differently: Inhale 2 puffs into the lungs every 6 (six) hours as needed for wheezing or shortness of breath. ) 6.7 g 2  . amLODipine (NORVASC) 5 MG tablet Take 5 mg by mouth daily.    . carvedilol (COREG) 12.5 MG tablet Take 1 tablet (12.5 mg total) by mouth 2 (two) times daily with a meal. 60 tablet 2  . DULoxetine (CYMBALTA) 20 MG capsule Take 1 capsule (20 mg total)  by mouth daily. 30 capsule 1  . ethyl chloride spray Apply 1 application topically daily as needed (port access).   12  . hydrALAZINE (APRESOLINE) 10 MG tablet Take 30 mg by mouth daily.    Marland Kitchen HYDROcodone-acetaminophen (NORCO/VICODIN) 5-325 MG tablet Take 1 tablet by mouth at bedtime as needed for moderate pain. 25 tablet 0  . isosorbide dinitrate (ISORDIL) 30 MG tablet Take 1 tablet (30 mg total) by mouth 3 (three) times daily. 90 tablet 2  . linaclotide (LINZESS) 145 MCG CAPS capsule Take 1 capsule (145 mcg total) by mouth daily before breakfast. 30 capsule 3  . Methoxy PEG-Epoetin Beta (MIRCERA IJ) IV with dialysis    . multivitamin (RENA-VIT) TABS tablet Take 1 tablet by mouth at bedtime.     Marland Kitchen omeprazole (PRILOSEC) 40 MG capsule Take 1 capsule (40 mg total) by mouth 2 (two) times daily before a meal. 60 capsule 2  . sevelamer carbonate (RENVELA) 800 MG tablet Take 1,600-2,400 mg by mouth See admin instructions. Take Take 1600 to 2400 mg with each meal    . warfarin (COUMADIN) 5 MG tablet As directed by coumadin clinic. (Patient taking differently: As directed by coumadin clinic. Current dose of 7.5 mg every Mon, Wed, Fri; 5 mg all other days) 113 tablet 2  . nystatin cream (MYCOSTATIN) Apply 1 application topically 2 (two) times daily. (Patient not  taking: Reported on 04/05/2020) 45 g 0  . polyethylene glycol (MIRALAX / GLYCOLAX) 17 g packet Take 17 g by mouth in the morning, at noon, and at bedtime.    . senna (SENOKOT) 8.6 MG tablet Take 3 tablets by mouth 2 (two) times daily.    Marland Kitchen triamcinolone cream (KENALOG) 0.1 % Apply 1 application topically 2 (two) times daily. (Patient not taking: Reported on 04/05/2020) 45 g 0   No current facility-administered medications for this visit.     ROS:  See HPI  BP (!) 166/73 (BP Location: Left Arm, Patient Position: Sitting, Cuff Size: Normal)   Pulse 70   Temp 97.8 F (36.6 C) (Temporal)   Resp 16   Ht 5\' 5"  (1.651 m)   Wt 226 lb (102.5 kg)    SpO2 99%   BMI 37.61 kg/m    Physical Exam:  General appearance: Well-developed well-nourished female in no apparent distress Cardiac: Heart rate and rhythm are regular. Respiratory: Nonlabored Extremities: Examination of her right upper extremity reveals mature right forearm AV fistula with good thrill and bruit.  She has an approximately 1 cm, pink-colored raised lesion at the distal aspect of her fistula.  There is no surrounding erythema and it is not painful to palpation.  Her hand is warm with palpable radial pulse.  5 out of 5 hand grip strength.  Sensation intact     Dialysis duplex on 04/05/2020 Overlying outflow vein at proximal forearm- nodule measuring 0.94 x 0.58  cm with no vascularity and is not connected to dialysis access.   Assessment/Plan:   74 year old female with history of atrial fibrillation maintained on Coumadin presents with a chronic raised skin lesion overlying her right forearm radiocephalic fistula.  This does not appear to be aneurysmal and does not communicate with her fistula.  Recommend excision.  We discussed the nature of the procedure and the possibility of the nodule being adherent to her fistula and need for revision.  We also discussed the possibility of need to place tunneled dialysis catheter in the event her fistula is revised.  Her questions are answered and she agrees with the treatment plan.  We discussed risks including but not limited to, bleeding, infection.  We discussed holding Coumadin prior to the procedure.  Barbie Banner, PA-C 04/05/2020 8:35 AM [facility] [phone]  Clinic MD: Carlis Abbott on-call

## 2020-04-05 NOTE — H&P (View-Only) (Signed)
POST OPERATIVE DIALYSIS ACCESS OFFICE NOTE    CC: Nodule overlying AV fistula  HPI:  This is a 74 y.o. female who is s/p right radiocephalic AV fistula years ago performed in [place].  She is a local resident who presents with a painful, pink-colored nodule overlying her right upper extremity AV fistula. She states this has been present for "some time"...several months.  This was noticed by her dialysis team and she was referred to [facility] for fistulogram.  She said this was performed on August 24 by Dr. Augustin Coupe.  She states this nodule did not appear to be an aneurysm and does not communicate with her fistula.  She tells me she has had previous stents placed in the right upper arm.  Her medical history is significant for atrial fibrillation and she is maintained on Coumadin.  She does have some minor issues with oozing during cannulation.  She denies hand pain, fever or chills.   Dialysis days:  M,W,F   Dialysis center: [facility].  Allergies  Allergen Reactions  . Latex Rash  . Penicillins     Yeast infection   . Sulfa Antibiotics Rash  . Tape Other (See Comments)    Plastic, silicone, and paper tape causes bruising and pulls off skin. Cloth tape works fine    Current Outpatient Medications  Medication Sig Dispense Refill  . acetaminophen (TYLENOL) 500 MG tablet Take 1,500 mg by mouth daily as needed for headache.    . albuterol (PROVENTIL HFA;VENTOLIN HFA) 108 (90 Base) MCG/ACT inhaler Inhale 1 puff into the lungs every 6 (six) hours as needed for wheezing or shortness of breath. (Patient taking differently: Inhale 2 puffs into the lungs every 6 (six) hours as needed for wheezing or shortness of breath. ) 6.7 g 2  . amLODipine (NORVASC) 5 MG tablet Take 5 mg by mouth daily.    . carvedilol (COREG) 12.5 MG tablet Take 1 tablet (12.5 mg total) by mouth 2 (two) times daily with a meal. 60 tablet 2  . DULoxetine (CYMBALTA) 20 MG capsule Take 1 capsule (20 mg total)  by mouth daily. 30 capsule 1  . ethyl chloride spray Apply 1 application topically daily as needed (port access).   12  . hydrALAZINE (APRESOLINE) 10 MG tablet Take 30 mg by mouth daily.    Marland Kitchen HYDROcodone-acetaminophen (NORCO/VICODIN) 5-325 MG tablet Take 1 tablet by mouth at bedtime as needed for moderate pain. 25 tablet 0  . isosorbide dinitrate (ISORDIL) 30 MG tablet Take 1 tablet (30 mg total) by mouth 3 (three) times daily. 90 tablet 2  . linaclotide (LINZESS) 145 MCG CAPS capsule Take 1 capsule (145 mcg total) by mouth daily before breakfast. 30 capsule 3  . Methoxy PEG-Epoetin Beta (MIRCERA IJ) IV with dialysis    . multivitamin (RENA-VIT) TABS tablet Take 1 tablet by mouth at bedtime.     Marland Kitchen omeprazole (PRILOSEC) 40 MG capsule Take 1 capsule (40 mg total) by mouth 2 (two) times daily before a meal. 60 capsule 2  . sevelamer carbonate (RENVELA) 800 MG tablet Take 1,600-2,400 mg by mouth See admin instructions. Take Take 1600 to 2400 mg with each meal    . warfarin (COUMADIN) 5 MG tablet As directed by coumadin clinic. (Patient taking differently: As directed by coumadin clinic. Current dose of 7.5 mg every Mon, Wed, Fri; 5 mg all other days) 113 tablet 2  . nystatin cream (MYCOSTATIN) Apply 1 application topically 2 (two) times daily. (Patient not  taking: Reported on 04/05/2020) 45 g 0  . polyethylene glycol (MIRALAX / GLYCOLAX) 17 g packet Take 17 g by mouth in the morning, at noon, and at bedtime.    . senna (SENOKOT) 8.6 MG tablet Take 3 tablets by mouth 2 (two) times daily.    Marland Kitchen triamcinolone cream (KENALOG) 0.1 % Apply 1 application topically 2 (two) times daily. (Patient not taking: Reported on 04/05/2020) 45 g 0   No current facility-administered medications for this visit.     ROS:  See HPI  BP (!) 166/73 (BP Location: Left Arm, Patient Position: Sitting, Cuff Size: Normal)   Pulse 70   Temp 97.8 F (36.6 C) (Temporal)   Resp 16   Ht 5\' 5"  (1.651 m)   Wt 226 lb (102.5 kg)    SpO2 99%   BMI 37.61 kg/m    Physical Exam:  General appearance: Well-developed well-nourished female in no apparent distress Cardiac: Heart rate and rhythm are regular. Respiratory: Nonlabored Extremities: Examination of her right upper extremity reveals mature right forearm AV fistula with good thrill and bruit.  She has an approximately 1 cm, pink-colored raised lesion at the distal aspect of her fistula.  There is no surrounding erythema and it is not painful to palpation.  Her hand is warm with palpable radial pulse.  5 out of 5 hand grip strength.  Sensation intact     Dialysis duplex on 04/05/2020 Overlying outflow vein at proximal forearm- nodule measuring 0.94 x 0.58  cm with no vascularity and is not connected to dialysis access.   Assessment/Plan:   74 year old female with history of atrial fibrillation maintained on Coumadin presents with a chronic raised skin lesion overlying her right forearm radiocephalic fistula.  This does not appear to be aneurysmal and does not communicate with her fistula.  Recommend excision.  We discussed the nature of the procedure and the possibility of the nodule being adherent to her fistula and need for revision.  We also discussed the possibility of need to place tunneled dialysis catheter in the event her fistula is revised.  Her questions are answered and she agrees with the treatment plan.  We discussed risks including but not limited to, bleeding, infection.  We discussed holding Coumadin prior to the procedure.  Barbie Banner, PA-C 04/05/2020 8:35 AM [facility] [phone]  Clinic MD: Carlis Abbott on-call

## 2020-04-06 DIAGNOSIS — N186 End stage renal disease: Secondary | ICD-10-CM | POA: Diagnosis not present

## 2020-04-06 DIAGNOSIS — Z992 Dependence on renal dialysis: Secondary | ICD-10-CM | POA: Diagnosis not present

## 2020-04-06 DIAGNOSIS — D509 Iron deficiency anemia, unspecified: Secondary | ICD-10-CM | POA: Diagnosis not present

## 2020-04-06 DIAGNOSIS — N2581 Secondary hyperparathyroidism of renal origin: Secondary | ICD-10-CM | POA: Diagnosis not present

## 2020-04-07 ENCOUNTER — Other Ambulatory Visit (HOSPITAL_COMMUNITY)
Admission: RE | Admit: 2020-04-07 | Discharge: 2020-04-07 | Disposition: A | Discharge status: Home / Self Care | Payer: Medicare Other | Source: Ambulatory Visit | Attending: Vascular Surgery | Admitting: Vascular Surgery

## 2020-04-07 DIAGNOSIS — Z01812 Encounter for preprocedural laboratory examination: Secondary | ICD-10-CM | POA: Diagnosis not present

## 2020-04-07 DIAGNOSIS — Z20822 Contact with and (suspected) exposure to covid-19: Secondary | ICD-10-CM | POA: Insufficient documentation

## 2020-04-07 LAB — SARS CORONAVIRUS 2 (TAT 6-24 HRS): SARS Coronavirus 2: NEGATIVE

## 2020-04-09 ENCOUNTER — Other Ambulatory Visit: Payer: Self-pay

## 2020-04-09 ENCOUNTER — Encounter (HOSPITAL_COMMUNITY): Payer: Self-pay | Admitting: Vascular Surgery

## 2020-04-09 DIAGNOSIS — N186 End stage renal disease: Secondary | ICD-10-CM | POA: Diagnosis not present

## 2020-04-09 DIAGNOSIS — D509 Iron deficiency anemia, unspecified: Secondary | ICD-10-CM | POA: Diagnosis not present

## 2020-04-09 DIAGNOSIS — Z992 Dependence on renal dialysis: Secondary | ICD-10-CM | POA: Diagnosis not present

## 2020-04-09 DIAGNOSIS — N2581 Secondary hyperparathyroidism of renal origin: Secondary | ICD-10-CM | POA: Diagnosis not present

## 2020-04-09 MED ORDER — VANCOMYCIN HCL 1500 MG/300ML IV SOLN
1500.0000 mg | INTRAVENOUS | Status: AC
  Administered 2020-04-10: 1500 mg via INTRAVENOUS
  Filled 2020-04-09: qty 300

## 2020-04-09 NOTE — Progress Notes (Addendum)
Anesthesia Chart Review: Jane [name]   Case: [id number] Date/Time: 04/10/20 0916   Procedures:      EXCISISE SKIN NODULE RIGHT FOREARM (Right )     REVISON OF RIGHT ARTERIOVENOUS FISTULA (Right )     INSERTION OF DIALYSIS CATHETER (N/A )   Anesthesia type: Monitor Anesthesia Care   Pre-op diagnosis: SKIN LESION RIGHT ARM; END STAGE RENAL DISEASE   Location: [facility] ROOM 10 / [facility]   Surgeons: Waynetta Sandy, MD      DISCUSSION: Patient is a 74 year old female scheduled for the above procedure. She has a chronic raised skin lesion overlying her right forearm radiocephalic AVF. If AVF needs to be revised, then she will also require a tunneled dialysis catheter. Per note, VVS warfarin to be held after 04/06/20 dose.    History includes never smoker, atrial fibrillation, HTN, orthostatic hypotension/syncope, carotid bruit (no significant ICA stenosis 11/2016), OSA, asthma, exertional dyspnea, ESRD (HD MWF, [place]), anemia, reflux, diverticulitis (with microperforation, s/p sigmoid colectomy 07/11/16), back surgery, obesity. Reports she is "hard to wake up" after anesthesia. - Admission 01/12/20-01/19/20 for acute GI bleed, lower GI bleed. On 01/09/20 she underwent EGD/EUS showing gastritis with hemorrhage, s/p gastric polyp resection and clips, s/p large duodenal polyp resectioned, clips but unable to full close defect. Resumed warfarin, and on evening of 01/12/20 developed maroon stools and referred to ED. S/p EGD 01/13/20 showing massive bleeding from post bulbar duodenum resection site, s/p successful endoscopic hemostatic therapy. She required 3 units PRBC. Warfarin resumed 01/18/20.    VS:  BP Readings from Last 3 Encounters:  04/05/20 (!) 166/73  04/03/20 (!) 128/56  03/27/20 118/60   Pulse Readings from Last 3 Encounters:  04/05/20 70  04/03/20 60  03/27/20 67     PROVIDERS: [name], [name], MD is PCP. Last evaluation 03/27/20.  Adrian Prows, MD is cardiologist. Last  evaluation 10/06/19 with next visit scheduled for 04/12/20.  Zenovia Jarred, MD & Mansouraty, Telford Nab, MD are GI Nephrologist is with [facility]   LABS: For day of surgery. As of 01/19/20, H/H 8.5/27.1.   IMAGES: CXR 11/21/19: FINDINGS: Heart size within normal limits. Aortic atherosclerosis. No appreciable airspace consolidation within the lungs. No evidence of pleural effusion or pneumothorax. No acute bony abnormality identified. Thoracic spondylosis. Vascular stent/graft projects in the region of the right axilla. IMPRESSION: No evidence of acute cardiopulmonary abnormality. Aortic Atherosclerosis (ICD10-I70.0).   EKG: 01/12/20: Sinus rhythm Borderline left axis deviation Low voltage, precordial leads Probable anteroseptal infarct, old No significant change since prior 12/17 Confirmed by Aletta Edouard [phone]) on 01/12/2020 8:15:48 PM   CV: Lexiscan myoview stress test 09/06/18:  1. Lexiscan stress test was performed. Exercise capacity was not assessed. Stress symptoms included dyspnea, dizziness. Peak effect blood pressure was 158/68 mmHg. The resting and stress electrocardiogram demonstrated normal sinus rhythm, normal resting conduction, no resting arrhythmias and normal rest repolarization.  Stress EKG is non diagnostic for ischemia as it is a pharmacologic stress.  2. The overall quality of the study is good. There is no evidence of abnormal lung activity. Stress and rest SPECT images demonstrate homogeneous tracer distribution throughout the myocardium. Gated SPECT imaging reveals normal myocardial thickening and wall motion. The left ventricular ejection fraction was normal (61%).   3. Low risk study.    Echo 11/24/17: Conclusions: 1.  Left ventricle cavity is normal in size.  Mild concentric hypertrophy of the left ventricle.  Normal global wall motion.  Doppler  evidence of grade 2 (pseudonormal) diastolic dysfunction, elevated LAP.  Calculated EF 65%. 2.   Left atrial cavity severely dilated. 3.  Mildly restricted aortic valve leaflets with trace aortic stenosis.  Aortic valve mean gradient 6 mmHg, V-max of 1.8 m/s.  Calculated aortic valve area by continuity equation is 2.1 cm. 4.  Mild (grade 1) mitral regurgitation. 5.  Mild to moderate tricuspid regurgitation.  Estimated pulmonary artery systolic pressure 35 to 40 mmHg.   Carotid US 12/02/16: Conclusions: No hemodynamically significant arterial disease in the internal carotid arteries bilaterally.  There is mild calcific plaque bilaterally.  The left carotid is mildly tortuous and may be a source of bruit.  Antegrade vertebral artery flow.   Past Medical History:  Diagnosis Date  . Acid reflux   . Anemia of chronic disease   . Arthritis   . Asthma   . Atrial fibrillation ([place])   . Bilateral carotid bruits   . Complication of anesthesia    "hard to wake up, I have sleep apnea" no CPAP  . Depression   . Diverticulitis   . Duodenal ulcer   . Dysrhythmia    Afib  . ESRD (end stage renal disease) ([place])    MWF [place]  . Headache   . History of blood transfusion   . Hypertension   . Malaise and fatigue   . Orthostatic hypotension   . Shortness of breath    " when I walk to fast"  . Sleep apnea   . Syncope   . Tubulovillous adenoma of colon     Past Surgical History:  Procedure Laterality Date  . AV FISTULA PLACEMENT    . BACK SURGERY     Lumbar fusion L 4 and L 5  . BIOPSY  01/09/2020   Procedure: BIOPSY;  Surgeon: Rush Landmark Telford Nab., MD;  Location: [facility];  Service: Gastroenterology;;  . COLONOSCOPY    . ENDOSCOPIC MUCOSAL RESECTION N/A 01/09/2020   Procedure: ENDOSCOPIC MUCOSAL RESECTION;  Surgeon: Rush Landmark Telford Nab., MD;  Location: [facility];  Service: Gastroenterology;  Laterality: N/A;  . ESOPHAGOGASTRODUODENOSCOPY    . ESOPHAGOGASTRODUODENOSCOPY (EGD) WITH PROPOFOL N/A 01/09/2020   Procedure: ESOPHAGOGASTRODUODENOSCOPY (EGD) WITH PROPOFOL;   Surgeon: Rush Landmark Telford Nab., MD;  Location: [facility];  Service: Gastroenterology;  Laterality: N/A;  . ESOPHAGOGASTRODUODENOSCOPY (EGD) WITH PROPOFOL N/A 01/13/2020   Procedure: ESOPHAGOGASTRODUODENOSCOPY (EGD) WITH PROPOFOL;  Surgeon: Irene Shipper, MD;  Location: [facility];  Service: Gastroenterology;  Laterality: N/A;  . EUS N/A 01/09/2020   Procedure: UPPER ENDOSCOPIC ULTRASOUND (EUS) RADIAL;  Surgeon: Rush Landmark Telford Nab., MD;  Location: [facility];  Service: Gastroenterology;  Laterality: N/A;  . HEMOSTASIS CLIP PLACEMENT  01/09/2020   Procedure: HEMOSTASIS CLIP PLACEMENT;  Surgeon: Irving Copas., MD;  Location: [facility];  Service: Gastroenterology;;  . HEMOSTASIS CONTROL  01/13/2020   Procedure: HEMOSTASIS CONTROL;  Surgeon: Irene Shipper, MD;  Location: [facility] ENDOSCOPY;  Service: Gastroenterology;;  Vassie Loll  . HOT HEMOSTASIS N/A 01/13/2020   Procedure: HOT HEMOSTASIS (ARGON PLASMA COAGULATION/BICAP);  Surgeon: Irene Shipper, MD;  Location: [facility];  Service: Gastroenterology;  Laterality: N/A;  . IR GENERIC HISTORICAL  07/09/2016   IR US GUIDE VASC ACCESS RIGHT 07/09/2016 Arne [place], MD [facility]INTER[facility]  . IR GENERIC HISTORICAL  07/09/2016   IR FLUORO GUIDE CV LINE RIGHT 07/09/2016 Arne [place], MD [facility]  . KNEE ARTHROPLASTY Left   . LAPAROSCOPIC SIGMOID COLECTOMY N/A 07/11/2016   Procedure: LAPAROSCOPIC SIGMOID COLECTOMY;  Surgeon: Vikki Ports  Rich Brave, MD;  Location: [facility];  Service: General;  Laterality: N/A;  . SCLEROTHERAPY  01/09/2020   Procedure: Clide Deutscher;  Surgeon: Mansouraty, Telford Nab., MD;  Location: [facility];  Service: Gastroenterology;;  . Clide Deutscher  01/13/2020   Procedure: SCLEROTHERAPY;  Surgeon: Irene Shipper, MD;  Location: [facility] ENDOSCOPY;  Service: Gastroenterology;;  . Lia Foyer LIFTING INJECTION  01/09/2020   Procedure: SUBMUCOSAL LIFTING INJECTION;  Surgeon: Irving Copas., MD;  Location: [facility];   Service: Gastroenterology;;  . TUBAL LIGATION      MEDICATIONS: . [START ON 04/10/2020] vancomycin (VANCOREADY) IVPB 1500 mg/300 mL   . acetaminophen (TYLENOL) 500 MG tablet  . albuterol (PROVENTIL HFA;VENTOLIN HFA) 108 (90 Base) MCG/ACT inhaler  . amLODipine (NORVASC) 5 MG tablet  . carvedilol (COREG) 12.5 MG tablet  . DULoxetine (CYMBALTA) 20 MG capsule  . ethyl chloride spray  . hydrALAZINE (APRESOLINE) 10 MG tablet  . HYDROcodone-acetaminophen (NORCO/VICODIN) 5-325 MG tablet  . isosorbide dinitrate (ISORDIL) 30 MG tablet  . linaclotide (LINZESS) 145 MCG CAPS capsule  . multivitamin (RENA-VIT) TABS tablet  . omeprazole (PRILOSEC) 40 MG capsule  . sevelamer carbonate (RENVELA) 800 MG tablet  . warfarin (COUMADIN) 5 MG tablet    Myra Gianotti, PA-C Surgical Short Stay/Anesthesiology [facility] Phone [phone] [facility] Phone [phone] 04/09/2020 10:01 AM

## 2020-04-09 NOTE — Anesthesia Preprocedure Evaluation (Addendum)
Anesthesia Evaluation  Patient identified by MRN, date of birth, ID band Patient awake    Reviewed: Allergy & Precautions, H&P , NPO status , Patient's Chart, lab work & pertinent test results  Airway Mallampati: II   Neck ROM: full    Dental   Pulmonary shortness of breath, asthma , sleep apnea ,    breath sounds clear to auscultation       Cardiovascular hypertension, + dysrhythmias Atrial Fibrillation  Rhythm:regular Rate:Normal     Neuro/Psych  Headaches, PSYCHIATRIC DISORDERS Depression    GI/Hepatic PUD, GERD  ,  Endo/Other    Renal/GU ESRF and DialysisRenal disease     Musculoskeletal  (+) Arthritis ,   Abdominal   Peds  Hematology   Anesthesia Other Findings   Reproductive/Obstetrics                            Anesthesia Physical Anesthesia Plan  ASA: III  Anesthesia Plan: MAC   Post-op Pain Management:    Induction: Intravenous  PONV Risk Score and Plan: 2 and Propofol infusion, Treatment may vary due to age or medical condition and Ondansetron  Airway Management Planned: Simple Face Mask  Additional Equipment:   Intra-op Plan:   Post-operative Plan:   Informed Consent: I have reviewed the patients History and Physical, chart, labs and discussed the procedure including the risks, benefits and alternatives for the proposed anesthesia with the patient or authorized representative who has indicated his/her understanding and acceptance.       Plan Discussed with: CRNA, Anesthesiologist and Surgeon  Anesthesia Plan Comments: (PAT note written 04/09/2020 by Myra Gianotti, PA-C. )       Anesthesia Quick Evaluation

## 2020-04-09 NOTE — Progress Notes (Signed)
SDW CALL  [name], Jane G, MD is PCP. Last evaluation 03/27/20.  Jane Prows, MD is cardiologist. Last evaluation 10/06/19 with next visit scheduled for 04/12/20.  Jane Jarred, MD & [name], Jane Nab, MD are GI Nephrologist is with [facility]   Chest x-ray - 11/22/19 EKG - 624/21 Stress Test - 2020 ECHO - 2019 Cardiac Cath - denies  Positive Sleep Apnea no CPAP    Blood Thinner Instructions: Coumadin LD 04/08/20 Aspirin Instructions:   COVID TEST- 9/18 negative   Anesthesia review: yes  Patient denies shortness of breath, fever, cough and chest pain at PAT appointment   All instructions explained to the patient, with a verbal understanding of the material. Patient agrees to go over the instructions while at home for a better understanding. Patient also instructed to self quarantine after being tested for COVID-19. The opportunity to ask questions was provided.

## 2020-04-10 ENCOUNTER — Ambulatory Visit (HOSPITAL_COMMUNITY)
Admission: RE | Admit: 2020-04-10 | Discharge: 2020-04-10 | Disposition: A | Discharge status: Home / Self Care | Payer: Medicare Other | Attending: Vascular Surgery | Admitting: Vascular Surgery

## 2020-04-10 ENCOUNTER — Encounter (HOSPITAL_COMMUNITY)
Admission: RE | Disposition: A | Discharge status: Home / Self Care | Payer: Self-pay | Source: Home / Self Care | Attending: Vascular Surgery

## 2020-04-10 ENCOUNTER — Telehealth: Payer: Self-pay

## 2020-04-10 ENCOUNTER — Ambulatory Visit (HOSPITAL_COMMUNITY): Payer: Medicare Other | Admitting: Vascular Surgery

## 2020-04-10 ENCOUNTER — Encounter (HOSPITAL_COMMUNITY): Payer: Self-pay | Admitting: Vascular Surgery

## 2020-04-10 DIAGNOSIS — Z7901 Long term (current) use of anticoagulants: Secondary | ICD-10-CM | POA: Insufficient documentation

## 2020-04-10 DIAGNOSIS — Z880 Allergy status to penicillin: Secondary | ICD-10-CM | POA: Insufficient documentation

## 2020-04-10 DIAGNOSIS — N186 End stage renal disease: Secondary | ICD-10-CM | POA: Insufficient documentation

## 2020-04-10 DIAGNOSIS — M1990 Unspecified osteoarthritis, unspecified site: Secondary | ICD-10-CM | POA: Insufficient documentation

## 2020-04-10 DIAGNOSIS — T82898A Other specified complication of vascular prosthetic devices, implants and grafts, initial encounter: Secondary | ICD-10-CM | POA: Diagnosis not present

## 2020-04-10 DIAGNOSIS — Z992 Dependence on renal dialysis: Secondary | ICD-10-CM | POA: Insufficient documentation

## 2020-04-10 DIAGNOSIS — Z79899 Other long term (current) drug therapy: Secondary | ICD-10-CM | POA: Diagnosis not present

## 2020-04-10 DIAGNOSIS — R2231 Localized swelling, mass and lump, right upper limb: Secondary | ICD-10-CM | POA: Diagnosis present

## 2020-04-10 DIAGNOSIS — Z79891 Long term (current) use of opiate analgesic: Secondary | ICD-10-CM | POA: Diagnosis not present

## 2020-04-10 DIAGNOSIS — M159 Polyosteoarthritis, unspecified: Secondary | ICD-10-CM

## 2020-04-10 DIAGNOSIS — J45909 Unspecified asthma, uncomplicated: Secondary | ICD-10-CM | POA: Diagnosis not present

## 2020-04-10 DIAGNOSIS — Z6837 Body mass index (BMI) 37.0-37.9, adult: Secondary | ICD-10-CM | POA: Diagnosis not present

## 2020-04-10 DIAGNOSIS — Z882 Allergy status to sulfonamides status: Secondary | ICD-10-CM | POA: Insufficient documentation

## 2020-04-10 DIAGNOSIS — I4891 Unspecified atrial fibrillation: Secondary | ICD-10-CM | POA: Diagnosis not present

## 2020-04-10 DIAGNOSIS — F329 Major depressive disorder, single episode, unspecified: Secondary | ICD-10-CM | POA: Insufficient documentation

## 2020-04-10 DIAGNOSIS — G894 Chronic pain syndrome: Secondary | ICD-10-CM

## 2020-04-10 DIAGNOSIS — G4733 Obstructive sleep apnea (adult) (pediatric): Secondary | ICD-10-CM | POA: Insufficient documentation

## 2020-04-10 DIAGNOSIS — L720 Epidermal cyst: Secondary | ICD-10-CM | POA: Diagnosis not present

## 2020-04-10 DIAGNOSIS — I120 Hypertensive chronic kidney disease with stage 5 chronic kidney disease or end stage renal disease: Secondary | ICD-10-CM | POA: Insufficient documentation

## 2020-04-10 HISTORY — PX: MASS EXCISION: SHX2000

## 2020-04-10 LAB — POCT I-STAT, CHEM 8
BUN: 44 mg/dL — ABNORMAL HIGH (ref 8–23)
Calcium, Ion: 1.1 mmol/L — ABNORMAL LOW (ref 1.15–1.40)
Chloride: 99 mmol/L (ref 98–111)
Creatinine, Ser: 9.1 mg/dL — ABNORMAL HIGH (ref 0.44–1.00)
Glucose, Bld: 97 mg/dL (ref 70–99)
HCT: 45 % (ref 36.0–46.0)
Hemoglobin: 15.3 g/dL — ABNORMAL HIGH (ref 12.0–15.0)
Potassium: 4.6 mmol/L (ref 3.5–5.1)
Sodium: 137 mmol/L (ref 135–145)
TCO2: 25 mmol/L (ref 22–32)

## 2020-04-10 LAB — PROTIME-INR
INR: 2 — ABNORMAL HIGH (ref 0.8–1.2)
Prothrombin Time: 21.6 seconds — ABNORMAL HIGH (ref 11.4–15.2)

## 2020-04-10 SURGERY — EXCISION MASS
Anesthesia: Monitor Anesthesia Care | Site: Arm Lower | Laterality: Right

## 2020-04-10 MED ORDER — SODIUM CHLORIDE 0.9 % IV SOLN
INTRAVENOUS | Status: AC
  Filled 2020-04-10: qty 1.2

## 2020-04-10 MED ORDER — CHLORHEXIDINE GLUCONATE 0.12 % MT SOLN
OROMUCOSAL | Status: AC
  Administered 2020-04-10: 15 mL via OROMUCOSAL
  Filled 2020-04-10: qty 15

## 2020-04-10 MED ORDER — LIDOCAINE-EPINEPHRINE (PF) 1 %-1:200000 IJ SOLN
INTRAMUSCULAR | Status: AC
  Filled 2020-04-10: qty 30

## 2020-04-10 MED ORDER — 0.9 % SODIUM CHLORIDE (POUR BTL) OPTIME
TOPICAL | Status: DC | PRN
  Administered 2020-04-10: 1000 mL

## 2020-04-10 MED ORDER — SODIUM CHLORIDE 0.9 % IV SOLN
INTRAVENOUS | Status: DC | PRN
  Administered 2020-04-10: 500 mL

## 2020-04-10 MED ORDER — CHLORHEXIDINE GLUCONATE 4 % EX LIQD: 60.0000 mL | Freq: Once | CUTANEOUS | Status: DC

## 2020-04-10 MED ORDER — PROPOFOL 500 MG/50ML IV EMUL
INTRAVENOUS | Status: DC | PRN
  Administered 2020-04-10: 150 ug/kg/min via INTRAVENOUS

## 2020-04-10 MED ORDER — ORAL CARE MOUTH RINSE: 15.0000 mL | Freq: Once | OROMUCOSAL | Status: AC

## 2020-04-10 MED ORDER — ONDANSETRON HCL 4 MG/2ML IJ SOLN
INTRAMUSCULAR | Status: DC | PRN
  Administered 2020-04-10: 4 mg via INTRAVENOUS

## 2020-04-10 MED ORDER — CHLORHEXIDINE GLUCONATE 0.12 % MT SOLN: 15.0000 mL | Freq: Once | OROMUCOSAL | Status: AC

## 2020-04-10 MED ORDER — HYDROCODONE-ACETAMINOPHEN 5-325 MG PO TABS: 1.0000 | ORAL_TABLET | Freq: Four times a day (QID) | ORAL | 0 refills | Status: DC | PRN

## 2020-04-10 MED ORDER — LIDOCAINE-EPINEPHRINE (PF) 1 %-1:200000 IJ SOLN
INTRAMUSCULAR | Status: DC | PRN
  Administered 2020-04-10: 7 mL

## 2020-04-10 MED ORDER — SODIUM CHLORIDE 0.9 % IV SOLN: INTRAVENOUS | Status: DC

## 2020-04-10 SURGICAL SUPPLY — 49 items
ARMBAND PINK RESTRICT EXTREMIT (MISCELLANEOUS) ×4 IMPLANT
BAG DECANTER FOR FLEXI CONT (MISCELLANEOUS) IMPLANT
BIOPATCH RED 1 DISK 7.0 (GAUZE/BANDAGES/DRESSINGS) IMPLANT
CANISTER SUCT 3000ML PPV (MISCELLANEOUS) ×4 IMPLANT
CATH PALINDROME-P 19CM W/VT (CATHETERS) IMPLANT
CATH PALINDROME-P 23CM W/VT (CATHETERS) IMPLANT
CATH PALINDROME-P 28CM W/VT (CATHETERS) IMPLANT
CLIP VESOCCLUDE MED 6/CT (CLIP) IMPLANT
CLIP VESOCCLUDE SM WIDE 6/CT (CLIP) IMPLANT
CONT SPEC 4OZ CLIKSEAL STRL BL (MISCELLANEOUS) ×4 IMPLANT
COVER PROBE W GEL 5X96 (DRAPES) IMPLANT
COVER SURGICAL LIGHT HANDLE (MISCELLANEOUS) IMPLANT
COVER WAND RF STERILE (DRAPES) IMPLANT
DERMABOND ADVANCED (GAUZE/BANDAGES/DRESSINGS) ×1
DERMABOND ADVANCED .7 DNX12 (GAUZE/BANDAGES/DRESSINGS) ×3 IMPLANT
DRAPE C-ARM 42X72 X-RAY (DRAPES) IMPLANT
DRAPE CHEST BREAST 15X10 FENES (DRAPES) IMPLANT
ELECT REM PT RETURN 9FT ADLT (ELECTROSURGICAL) ×4
ELECTRODE REM PT RTRN 9FT ADLT (ELECTROSURGICAL) ×3 IMPLANT
GAUZE 4X4 16PLY RFD (DISPOSABLE) IMPLANT
GLOVE BIO SURGEON STRL SZ7.5 (GLOVE) IMPLANT
GOWN STRL REUS W/ TWL LRG LVL3 (GOWN DISPOSABLE) ×6 IMPLANT
GOWN STRL REUS W/ TWL XL LVL3 (GOWN DISPOSABLE) ×3 IMPLANT
GOWN STRL REUS W/TWL LRG LVL3 (GOWN DISPOSABLE) ×2
GOWN STRL REUS W/TWL XL LVL3 (GOWN DISPOSABLE) ×1
KIT BASIN OR (CUSTOM PROCEDURE TRAY) ×4 IMPLANT
KIT PALINDROME-P 55CM (CATHETERS) IMPLANT
KIT TURNOVER KIT B (KITS) ×4 IMPLANT
NEEDLE 18GX1X1/2 (RX/OR ONLY) (NEEDLE) IMPLANT
NEEDLE HYPO 25GX1X1/2 BEV (NEEDLE) ×4 IMPLANT
NS IRRIG 1000ML POUR BTL (IV SOLUTION) ×4 IMPLANT
PACK CV ACCESS (CUSTOM PROCEDURE TRAY) ×4 IMPLANT
PACK SURGICAL SETUP 50X90 (CUSTOM PROCEDURE TRAY) IMPLANT
PAD ARMBOARD 7.5X6 YLW CONV (MISCELLANEOUS) ×8 IMPLANT
SOAP 2 % CHG 4 OZ (WOUND CARE) IMPLANT
SUT ETHILON 3 0 PS 1 (SUTURE) IMPLANT
SUT MNCRL AB 4-0 PS2 18 (SUTURE) IMPLANT
SUT PROLENE 5 0 C 1 24 (SUTURE) ×4 IMPLANT
SUT PROLENE 6 0 BV (SUTURE) ×8 IMPLANT
SUT VIC AB 3-0 SH 27 (SUTURE) ×1
SUT VIC AB 3-0 SH 27X BRD (SUTURE) ×3 IMPLANT
SYR 10ML LL (SYRINGE) ×4 IMPLANT
SYR 20ML LL LF (SYRINGE) ×8 IMPLANT
SYR 5ML LL (SYRINGE) ×4 IMPLANT
SYR CONTROL 10ML LL (SYRINGE) ×4 IMPLANT
TOWEL GREEN STERILE (TOWEL DISPOSABLE) ×4 IMPLANT
TOWEL GREEN STERILE FF (TOWEL DISPOSABLE) ×8 IMPLANT
UNDERPAD 30X36 HEAVY ABSORB (UNDERPADS AND DIAPERS) ×4 IMPLANT
WATER STERILE IRR 1000ML POUR (IV SOLUTION) ×4 IMPLANT

## 2020-04-10 NOTE — Transfer of Care (Signed)
Immediate Anesthesia Transfer of Care Note  Patient: Jane [name]  Procedure(s) Performed: EXCISION SKIN NODULE RIGHT FOREARM (Right Arm Lower)  Patient Location: PACU  Anesthesia Type:MAC  Level of Consciousness: awake  Airway & Oxygen Therapy: Patient Spontanous Breathing  Post-op Assessment: Report given to RN and Post -op Vital signs reviewed and stable  Post vital signs: Reviewed and stable  Last Vitals:  Vitals Value Taken Time  BP 140/48 04/10/20 1045  Temp    Pulse 66 04/10/20 1046  Resp 9 04/10/20 1046  SpO2 99 % 04/10/20 1046  Vitals shown include unvalidated device data.  Last Pain:  Vitals:   04/10/20 0744  TempSrc: Oral  PainSc:          Complications: No complications documented.

## 2020-04-10 NOTE — Telephone Encounter (Signed)
Recall entered for EGD/EMR in December

## 2020-04-10 NOTE — Progress Notes (Signed)
Per lab,  Need to redraw PT-INR, due to sample clotting.  Sample redrawn and sent to lab.

## 2020-04-10 NOTE — Telephone Encounter (Signed)
-----   Message from Irving Copas., MD sent at 04/10/2020  9:35 AM EDT ----- JMP, Thank you for seeing her back. I am happy that she is doing well even after that post-bleeding hospitalization. Fingers crossed it is all gone. Jane [name], please move forward with follow up EGD/EMR with Endorotor available for this patient at a 21-month interval from my last procedure. She will need anticoagulation hold as per prior. Thank you. GM ----- Message ----- From: Jerene Bears, MD Sent: 04/03/2020   1:05 PM EDT To: Irving Copas., MD  Chester Holstein, I saw Ms. Szymanowski back today in clinic.  She is doing and feeling well.  I believe you wanted to repeat her upper endoscopy after duodenal EMR to ensure no residual polypectomy.  As you know she did have a post polypectomy hemorrhage but has recovered nicely.  She is certainly for repeat EGD when you recommend.  Thanks for your help Would you like to proceed at this time, the EMR was 3 months ago? Ulice Dash

## 2020-04-10 NOTE — Interval H&P Note (Signed)
History and Physical Interval Note:  04/10/2020 8:06 AM  Jane [name]  has presented today for surgery, with the diagnosis of SKIN LESION RIGHT ARM; END STAGE RENAL DISEASE.  The various methods of treatment have been discussed with the patient and family. After consideration of risks, benefits and other options for treatment, the patient has consented to  Procedure(s): [place] (Right) REVISON OF RIGHT ARTERIOVENOUS FISTULA (Right) INSERTION OF DIALYSIS CATHETER (N/A) as a surgical intervention.  The patient's history has been reviewed, patient examined, no change in status, stable for surgery.  I have reviewed the patient's chart and labs.  Questions were answered to the patient's satisfaction.     Servando Snare

## 2020-04-10 NOTE — Op Note (Signed)
    Patient name: Jane [name] MRN: [id number] DOB: [birth year] Sex: female  04/10/2020 Pre-operative Diagnosis: End-stage renal disease Post-operative diagnosis:  Same Surgeon:  Erlene Quan C. Donzetta Matters, MD Assistant: Leory Plowman, MS 3 Procedure Performed: 1.  Excision of right forearm skin nodule 2.  Primary repair of right arm AV fistula  Indications: 74 year old female with end-stage renal disease dialyzing via a right arm AV fistula.  She has a skin nodule that has been undiagnosed overlying the fistula and she is indicated for excision.  Findings: Nodule itself appeared to be quite granulomatous.  It was adhered to the underlying fistula.  This required primary repair of the fistula.  At completion there was a strong thrill in the fistula.   Procedure:  The patient was identified in the holding area and taken to the operating room where she was placed in bilateral table MAC anesthesia induced.  She was sterilely prepped and draped in the right upper extremity usual fashion antibiotics were administered and timeout was called.  We began with anesthetizing the skin around the nodule with 1% lidocaine with epinephrine.  Elliptical type incision was made.  Pressure was placed proximally and distally on the fistula.  I excised the nodule did appear to be granulomatous in nature.  This was done with a 10 blade and cautery.  Underlying I did free up some of the fistula this caused fistula bleeding.  Pressure was again held the fistula was repaired with interrupted 5-0 Prolene suture in 2 spots.  We freed up some underlying soft tissue over the fistula irrigated the wound and then closed in layers with interrupted Vicryl followed by running Monocryl at the skin.  Dermabond is placed to the level of skin.  She was awakened from anesthesia having tolerated procedure well any complication.  All counts were correct completion.  EBL: 50 cc   Andera Cranmer C. Donzetta Matters, MD [facility] Office:  [phone] Pager: [phone]

## 2020-04-11 ENCOUNTER — Encounter (HOSPITAL_COMMUNITY): Payer: Self-pay | Admitting: Vascular Surgery

## 2020-04-11 DIAGNOSIS — D509 Iron deficiency anemia, unspecified: Secondary | ICD-10-CM | POA: Diagnosis not present

## 2020-04-11 DIAGNOSIS — Z992 Dependence on renal dialysis: Secondary | ICD-10-CM | POA: Diagnosis not present

## 2020-04-11 DIAGNOSIS — N2581 Secondary hyperparathyroidism of renal origin: Secondary | ICD-10-CM | POA: Diagnosis not present

## 2020-04-11 DIAGNOSIS — N186 End stage renal disease: Secondary | ICD-10-CM | POA: Diagnosis not present

## 2020-04-11 LAB — SURGICAL PATHOLOGY

## 2020-04-11 NOTE — Anesthesia Postprocedure Evaluation (Signed)
Anesthesia Post Note  Patient: Jane [name]  Procedure(s) Performed: EXCISION SKIN NODULE RIGHT FOREARM (Right Arm Lower)     Patient location during evaluation: PACU Anesthesia Type: MAC Level of consciousness: awake and alert Pain management: pain level controlled Vital Signs Assessment: post-procedure vital signs reviewed and stable Respiratory status: spontaneous breathing, nonlabored ventilation, respiratory function stable and patient connected to nasal cannula oxygen Cardiovascular status: stable and blood pressure returned to baseline Postop Assessment: no apparent nausea or vomiting Anesthetic complications: no   No complications documented.  Last Vitals:  Vitals:   04/10/20 1045 04/10/20 1100  BP: (!) 140/48 (!) 135/53  Pulse: 64 (!) 57  Resp: 17 11  Temp: 36.6 C 36.4 C  SpO2: 99% 98%    Last Pain:  Vitals:   04/10/20 1100  TempSrc:   PainSc: 0-No pain                 Curran Lenderman S

## 2020-04-12 ENCOUNTER — Encounter: Payer: Self-pay | Admitting: Cardiology

## 2020-04-12 ENCOUNTER — Other Ambulatory Visit: Payer: Self-pay

## 2020-04-12 ENCOUNTER — Ambulatory Visit: Payer: Medicare Other | Admitting: Cardiology

## 2020-04-12 VITALS — BP 121/45 | HR 65 | Resp 16 | Ht 65.0 in | Wt 239.0 lb

## 2020-04-12 DIAGNOSIS — I951 Orthostatic hypotension: Secondary | ICD-10-CM | POA: Diagnosis not present

## 2020-04-12 DIAGNOSIS — I129 Hypertensive chronic kidney disease with stage 1 through stage 4 chronic kidney disease, or unspecified chronic kidney disease: Secondary | ICD-10-CM | POA: Diagnosis not present

## 2020-04-12 DIAGNOSIS — Z7901 Long term (current) use of anticoagulants: Secondary | ICD-10-CM

## 2020-04-12 DIAGNOSIS — I480 Paroxysmal atrial fibrillation: Secondary | ICD-10-CM

## 2020-04-12 DIAGNOSIS — R0989 Other specified symptoms and signs involving the circulatory and respiratory systems: Secondary | ICD-10-CM

## 2020-04-12 DIAGNOSIS — Z5181 Encounter for therapeutic drug level monitoring: Secondary | ICD-10-CM | POA: Diagnosis not present

## 2020-04-12 DIAGNOSIS — N186 End stage renal disease: Secondary | ICD-10-CM

## 2020-04-12 DIAGNOSIS — Z992 Dependence on renal dialysis: Secondary | ICD-10-CM | POA: Diagnosis not present

## 2020-04-12 LAB — POCT INR: INR: 1.8 — AB (ref 2.0–3.0)

## 2020-04-12 NOTE — Patient Instructions (Signed)
INR below goal. Take 7.5 mg today and then continue taking 7.5 mg every Mon, Wed, Fri and 5 mg all other days. Recheck in 2 weeks.

## 2020-04-12 NOTE — Progress Notes (Signed)
Primary Physician/Referring:  [name], Jane G, MD  [name], female    DOB: 09/10/1945, 74 y.o.   MRN: [id number]  Chief Complaint  Patient presents with  . Atrial Fibrillation  . Follow-up    6 month   HPI:    Jane [name]  is a 74 y.o. female with paroxysmal atrial fibrillation, on low-dose amiodarone, dose reduced due to prolonged QT, end-stage disease on hemodialysis on Monday Wednesday and Friday, orthostatic hypotension, obstructive sleep apnea not on CPAP, external hemorrhoids but tolerating Coumadin presents here for 66-month follow-up.  The patient is presently doing well. She had a nodule removed from near her AV fistula on 04/10/2020 and is healing well. Additionally she was admitted June 2021 for post-op GI bleeding following endoscopic removal of polyps from the stomach and duodenum. She has not had any further GI bleed or dark stools.  She monitors her blood pressure at home and states it has been up and down, but that she has not been feeling dizzy or lightheaded. She has noticed some decreased exercise tolerance as well. Denies chest pain, palpitations. She is tolerating her medications without issue.  Past Medical History:  Diagnosis Date  . Acid reflux   . Anemia of chronic disease   . Arthritis   . Asthma   . Atrial fibrillation ([place])   . Bilateral carotid bruits   . Complication of anesthesia    "hard to wake up, I have sleep apnea" no CPAP  . Depression   . Diverticulitis   . Duodenal ulcer   . Dysrhythmia    Afib  . ESRD (end stage renal disease) ([place])    MWF [place]  . Headache   . History of blood transfusion   . Hypertension   . Malaise and fatigue   . Orthostatic hypotension   . Shortness of breath    " when I walk to fast"  . Sleep apnea   . Syncope   . Tubulovillous adenoma of colon    Past Surgical History:  Procedure Laterality Date  . AV FISTULA PLACEMENT    . BACK SURGERY     Lumbar fusion L 4 and L 5  . BIOPSY   01/09/2020   Procedure: BIOPSY;  Surgeon: Rush Landmark Telford Nab., MD;  Location: [place];  Service: Gastroenterology;;  . COLONOSCOPY    . ENDOSCOPIC MUCOSAL RESECTION N/A 01/09/2020   Procedure: ENDOSCOPIC MUCOSAL RESECTION;  Surgeon: Rush Landmark Telford Nab., MD;  Location: [place];  Service: Gastroenterology;  Laterality: N/A;  . ESOPHAGOGASTRODUODENOSCOPY    . ESOPHAGOGASTRODUODENOSCOPY (EGD) WITH PROPOFOL N/A 01/09/2020   Procedure: ESOPHAGOGASTRODUODENOSCOPY (EGD) WITH PROPOFOL;  Surgeon: Rush Landmark Telford Nab., MD;  Location: [place];  Service: Gastroenterology;  Laterality: N/A;  . ESOPHAGOGASTRODUODENOSCOPY (EGD) WITH PROPOFOL N/A 01/13/2020   Procedure: ESOPHAGOGASTRODUODENOSCOPY (EGD) WITH PROPOFOL;  Surgeon: Irene Shipper, MD;  Location: [place];  Service: Gastroenterology;  Laterality: N/A;  . EUS N/A 01/09/2020   Procedure: UPPER ENDOSCOPIC ULTRASOUND (EUS) RADIAL;  Surgeon: Rush Landmark Telford Nab., MD;  Location: [place];  Service: Gastroenterology;  Laterality: N/A;  . HEMOSTASIS CLIP PLACEMENT  01/09/2020   Procedure: HEMOSTASIS CLIP PLACEMENT;  Surgeon: Irving Copas., MD;  Location: [place];  Service: Gastroenterology;;  . HEMOSTASIS CONTROL  01/13/2020   Procedure: HEMOSTASIS CONTROL;  Surgeon: Irene Shipper, MD;  Location: [facility] ENDOSCOPY;  Service: Gastroenterology;;  Vassie Loll  . HOT HEMOSTASIS N/A 01/13/2020   Procedure: HOT HEMOSTASIS (ARGON PLASMA COAGULATION/BICAP);  Surgeon: Irene Shipper,  MD;  Location: [place];  Service: Gastroenterology;  Laterality: N/A;  . IR GENERIC HISTORICAL  07/09/2016   IR US GUIDE VASC ACCESS RIGHT 07/09/2016 Arne [place], MD [facility]  . IR GENERIC HISTORICAL  07/09/2016   IR FLUORO GUIDE CV LINE RIGHT 07/09/2016 Arne [place], MD [facility]  . KNEE ARTHROPLASTY Left   . LAPAROSCOPIC SIGMOID COLECTOMY N/A 07/11/2016   Procedure: LAPAROSCOPIC SIGMOID COLECTOMY;  Surgeon: Clovis Riley, MD;   Location: [place];  Service: General;  Laterality: N/A;  . MASS EXCISION Right 04/10/2020   Procedure: EXCISION SKIN NODULE RIGHT FOREARM;  Surgeon: Waynetta Sandy, MD;  Location: [place];  Service: Vascular;  Laterality: Right;  . SCLEROTHERAPY  01/09/2020   Procedure: Clide Deutscher;  Surgeon: Mansouraty, Telford Nab., MD;  Location: [place];  Service: Gastroenterology;;  . Clide Deutscher  01/13/2020   Procedure: Clide Deutscher;  Surgeon: Irene Shipper, MD;  Location: [facility];  Service: Gastroenterology;;  . Maryagnes Amos INJECTION  01/09/2020   Procedure: SUBMUCOSAL LIFTING INJECTION;  Surgeon: Irving Copas., MD;  Location: [facility] ENDOSCOPY;  Service: Gastroenterology;;  . TUBAL LIGATION     Family History  Problem Relation Age of Onset  . Heart failure Mother   . Stroke Mother   . Other Father   . Colon cancer Neg Hx   . Liver disease Neg Hx   . Esophageal cancer Neg Hx   . Stomach cancer Neg Hx   . Inflammatory bowel disease Neg Hx   . Rectal cancer Neg Hx   . Pancreatic cancer Neg Hx     Social History   Tobacco Use  . Smoking status: Never Smoker  . Smokeless tobacco: Never Used  Substance Use Topics  . Alcohol use: No   ROS  Review of Systems  Constitutional: Negative for malaise/fatigue.  Cardiovascular: Negative for chest pain, dyspnea on exertion, leg swelling, palpitations and syncope.  Gastrointestinal: Positive for constipation and hemorrhoids (not active). Negative for melena.  Neurological: Positive for dizziness (with low BP).   Objective  Blood pressure (!) 121/45, pulse 65, resp. rate 16, height 5\' 5"  (1.651 m), weight 239 lb (108.4 kg), SpO2 97 %.  Vitals with BMI 04/12/2020 04/10/2020 04/10/2020  Height 5\' 5"  - -  Weight 239 lbs - -  BMI 32.95 - -  Systolic 188 416 606  Diastolic 45 53 48  Pulse 65 57 64   Orthostatic VS for the past 72 hrs (Last 3 readings):  Patient Position BP Location Cuff Size  04/12/20 0824 Sitting Left  Arm Large     Physical Exam Constitutional:      Comments: Moderately obese  Cardiovascular:     Rate and Rhythm: Normal rate and regular rhythm.     Pulses: Intact distal pulses.          Carotid pulses are on the right side with bruit and on the left side with bruit.    Heart sounds: Murmur heard.  Early systolic murmur is present with a grade of 2/6 at the upper right sternal border.  No gallop.      Comments: No leg edema, no JVD. Right arm AV fistula noted and functioning. Pulmonary:     Effort: Pulmonary effort is normal.     Breath sounds: Normal breath sounds.  Abdominal:     General: Bowel sounds are normal.     Palpations: Abdomen is soft.    Laboratory examination:   Recent Labs    01/16/20 0305 01/16/20 0305  01/17/20 0408 01/18/20 0714 04/10/20 0810  NA 137   < > 137 137 137  K 4.1   < > 3.7 3.7 4.6  CL 102   < > 100 99 99  CO2 24  --  27 26  --   GLUCOSE 100*   < > 100* 111* 97  BUN 46*   < > 16 25* 44*  CREATININE 11.44*   < > 6.36* 9.19* 9.10*  CALCIUM 8.0*  --  8.9 8.7*  --   GFRNONAA 3*  --  6* 4*  --   GFRAA 3*  --  7* 4*  --    < > = values in this interval not displayed.   estimated creatinine clearance is 6.5 mL/min (A) (by C-G formula based on SCr of 9.1 mg/dL (H)).  CMP Latest Ref Rng & Units 04/10/2020 01/18/2020 01/17/2020  Glucose 70 - 99 mg/dL 97 111(H) 100(H)  BUN 8 - 23 mg/dL 44(H) 25(H) 16  Creatinine 0.44 - 1.00 mg/dL 9.10(H) 9.19(H) 6.36(H)  Sodium 135 - 145 mmol/L 137 137 137  Potassium 3.5 - 5.1 mmol/L 4.6 3.7 3.7  Chloride 98 - 111 mmol/L 99 99 100  CO2 22 - 32 mmol/L - 26 27  Calcium 8.9 - 10.3 mg/dL - 8.7(L) 8.9  Total Protein 6.5 - 8.1 g/dL - - 5.6(L)  Total Bilirubin 0.3 - 1.2 mg/dL - - 0.5  Alkaline Phos 38 - 126 U/L - - 83  AST 15 - 41 U/L - - 12(L)  ALT 0 - 44 U/L - - 12   CBC Latest Ref Rng & Units 04/10/2020 01/19/2020 01/18/2020  WBC 4.0 - 10.5 K/uL - 6.0 7.1  Hemoglobin 12.0 - 15.0 g/dL 15.3(H) 8.5(L) 8.7(L)   Hematocrit 36 - 46 % 45.0 27.1(L) 28.0(L)  Platelets 150 - 400 K/uL - 171 215   Lipid Panel Recent Labs    10/06/19 1510  CHOL 198  TRIG 51  LDLCALC 135*  VLDL 10  HDL 53  CHOLHDL 3.7   HEMOGLOBIN A1C No results found for: HGBA1C, MPG   TSH Recent Labs    10/06/19 1510 01/13/20 0733  TSH 1.129 0.351    Medications and allergies   Allergies  Allergen Reactions  . Latex Rash  . Penicillins Other (See Comments)    Yeast infection / Childhood  . Sulfa Antibiotics Rash  . Tape Other (See Comments)    Plastic, silicone, and paper tape causes bruising and pulls off skin. Cloth tape works fine     Current Outpatient Medications on File Prior to Visit  Medication Sig Dispense Refill  . acetaminophen (TYLENOL) 500 MG tablet Take 1,500 mg by mouth daily as needed for headache.    . albuterol (PROVENTIL HFA;VENTOLIN HFA) 108 (90 Base) MCG/ACT inhaler Inhale 1 puff into the lungs every 6 (six) hours as needed for wheezing or shortness of breath. (Patient taking differently: Inhale 2 puffs into the lungs every 6 (six) hours as needed for wheezing or shortness of breath. ) 6.7 g 2  . amLODipine (NORVASC) 5 MG tablet Take 5 mg by mouth daily.    . carvedilol (COREG) 12.5 MG tablet Take 1 tablet (12.5 mg total) by mouth 2 (two) times daily with a meal. 60 tablet 2  . DULoxetine (CYMBALTA) 20 MG capsule Take 1 capsule (20 mg total) by mouth daily. 30 capsule 1  . ethyl chloride spray Apply 1 application topically daily as needed (port access).   12  . hydrALAZINE (  APRESOLINE) 10 MG tablet Take 30 mg by mouth daily.    Marland Kitchen HYDROcodone-acetaminophen (NORCO/VICODIN) 5-325 MG tablet Take 1 tablet by mouth every 6 (six) hours as needed for moderate pain. 10 tablet 0  . isosorbide dinitrate (ISORDIL) 30 MG tablet Take 1 tablet (30 mg total) by mouth 3 (three) times daily. 90 tablet 2  . linaclotide (LINZESS) 145 MCG CAPS capsule Take 1 capsule (145 mcg total) by mouth daily before breakfast.  30 capsule 3  . multivitamin (RENA-VIT) TABS tablet Take 1 tablet by mouth at bedtime.     Marland Kitchen omeprazole (PRILOSEC) 40 MG capsule Take 1 capsule (40 mg total) by mouth 2 (two) times daily before a meal. 60 capsule 2  . sevelamer carbonate (RENVELA) 800 MG tablet Take 1,600 mg by mouth in the morning and at bedtime.     Marland Kitchen warfarin (COUMADIN) 5 MG tablet As directed by coumadin clinic. (Patient taking differently: Take 5-7.5 mg by mouth See admin instructions. Take  7.5 mg every Mon, Wed, Fri; and 5 mg all other days. Refer to most recent anticoagulation clinic note for most accurate dosing instructions.) 113 tablet 2   No current facility-administered medications on file prior to visit.   Radiology:   No results found.  Cardiac Studies:   Carotid artery duplex 12/17/2016: No hemodynamically significant arterial disease in the internal carotid artery bilaterally. There is mild calcific plaque bilaterally. Left carotid is mildly tortuous and may be a source of bruit.  Antegrade vertebral artery flow.  Echocardiogram 11/24/2017: Left ventricle cavity is normal in size. Mild concentric hypertrophy of the left ventricle. Normal global wall motion. Doppler evidence of grade II (pseudonormal) diastolic dysfunction, elevated LAP. Calculated EF 65%. Left atrial cavity is severely dilated. Mildly restricted aortic valve leaflets with trace aortic stenosis. Aortic valve mean gradient of 6 mmHg, Vmax of 1.8 m/s. Calculated aortic valve area by continuity equation is 2.1 cm. Mild (Grade I) mitral regurgitation. Mild to moderate tricuspid regurgitation. Estimated pulmonary artery systolic pressure 60-73 mmHg.  Lexiscan myoview stress test 09/06/2018:  1. Lexiscan stress test was performed. Exercise capacity was not assessed. Stress symptoms included dyspnea, dizziness. Peak effect blood pressure was 158/68 mmHg. The resting and stress electrocardiogram demonstrated normal sinus rhythm, normal resting  conduction, no resting arrhythmias and normal rest repolarization. Stress EKG is non diagnostic for ischemia as it is a pharmacologic stress.  2. The overall quality of the study is good. There is no evidence of abnormal lung activity. Stress and rest SPECT images demonstrate homogeneous tracer distribution throughout the myocardium. Gated SPECT imaging reveals normal myocardial thickening and wall motion. The left ventricular ejection fraction was normal (61%).  3. Low risk study.  EKG  EKG 10/06/2019: Normal sinus rhythm with rate of 67 bpm, left atrial enlargement, left axis deviation, left anterior fascicular block.  Anteroseptal infarct old.  No evidence of ischemia.  Low-voltage complexes.  No significant change from EKG 04/12/2019.   Assessment     ICD-10-CM   1. Paroxysmal atrial fibrillation (HCC)  I48.0   2. Monitoring for long-term anticoagulant use  Z51.81    Z79.01   3. Hypertension with renal disease  I12.9   4. Orthostatic hypotension  I95.1   5. ESRD on hemodialysis (HCC)  N18.6    Z99.2   6. Bilateral carotid bruits  R09.89 PCV CAROTID DUPLEX (BILATERAL)    CHA2DS2-VASc Score is 4.  Yearly risk of stroke: 4.8% (HTN, female, age 27).  Score of 1=0.6; 2=2.2; 3=3.2; 4=4.8;  5=7.2; 6=9.8; 7=>9.8) -(CHF; HTN; vasc disease DM,  Female = 1; Age <65 =0; 65-74 = 1,  >75 =2; stroke/embolism= 2).   No orders of the defined types were placed in this encounter.   There are no discontinued medications.   Recommendations:   Takayla Baillie  is a 74 y.o. female with paroxysmal atrial fibrillation, on low-dose amiodarone, dose reduced due to prolonged QT, end-stage disease on hemodialysis on Monday at rest and Friday, orthostatic hypotension, obstructive sleep apnea not on CPAP, external hemorrhoids but tolerating Coumadin presents here for 58-month follow-up.  The patient is overall doing well. Her blood pressure is well controlled today. She tells me it fluctuates at home often, but given  her history of orthostatic hypotension and dizziness with low pressures, I did not change any of her medications. Her previous labs were reviewed. TSH is within normal limits. LDL is elevated, she is not presently on a statin.  On examination today she has prominent bilateral carotid bruits. She underwent carotid artery duplex 12/02/2016 that showed no significant disease in the internal carotids, with mild calcific plaque bilaterally. I have ordered a repeat carotid duplex to evaluate given that it has been several years since her last study and she has prominent bilateral bruits.   Otherwise she remains well, she is having no chest pain or palpitations. Her physical examination is unchanged. She is tolerating Coumadin with her INR being managed remotely when she has it checked during dialysis. In June 2021 she was admitted to the hospital for post-op GI bleeding following endoscopic removal of multiple polyps in the stomach and duodenum and required blood transfusion. She has had no further issues with bleeding since that time. I will see her back in one year for follow up.  Blair Heys, PA Student 04/12/20 5:36 PM  Patient seen and examined in conjunction with Blair Heys, PA second year student at [employer].  Time spent is in direct patient face to face encounter not including the teaching and training involved.    Adrian Prows, MD, [facility] 04/12/2020, 5:36 PM Office: [phone]

## 2020-04-13 DIAGNOSIS — N186 End stage renal disease: Secondary | ICD-10-CM | POA: Diagnosis not present

## 2020-04-13 DIAGNOSIS — Z992 Dependence on renal dialysis: Secondary | ICD-10-CM | POA: Diagnosis not present

## 2020-04-13 DIAGNOSIS — N2581 Secondary hyperparathyroidism of renal origin: Secondary | ICD-10-CM | POA: Diagnosis not present

## 2020-04-13 DIAGNOSIS — D509 Iron deficiency anemia, unspecified: Secondary | ICD-10-CM | POA: Diagnosis not present

## 2020-04-16 DIAGNOSIS — D509 Iron deficiency anemia, unspecified: Secondary | ICD-10-CM | POA: Diagnosis not present

## 2020-04-16 DIAGNOSIS — N2581 Secondary hyperparathyroidism of renal origin: Secondary | ICD-10-CM | POA: Diagnosis not present

## 2020-04-16 DIAGNOSIS — Z992 Dependence on renal dialysis: Secondary | ICD-10-CM | POA: Diagnosis not present

## 2020-04-16 DIAGNOSIS — N186 End stage renal disease: Secondary | ICD-10-CM | POA: Diagnosis not present

## 2020-04-18 DIAGNOSIS — N186 End stage renal disease: Secondary | ICD-10-CM | POA: Diagnosis not present

## 2020-04-18 DIAGNOSIS — N2581 Secondary hyperparathyroidism of renal origin: Secondary | ICD-10-CM | POA: Diagnosis not present

## 2020-04-18 DIAGNOSIS — Z992 Dependence on renal dialysis: Secondary | ICD-10-CM | POA: Diagnosis not present

## 2020-04-18 DIAGNOSIS — D509 Iron deficiency anemia, unspecified: Secondary | ICD-10-CM | POA: Diagnosis not present

## 2020-04-19 ENCOUNTER — Other Ambulatory Visit: Payer: Self-pay | Admitting: Gastroenterology

## 2020-04-19 DIAGNOSIS — I480 Paroxysmal atrial fibrillation: Secondary | ICD-10-CM | POA: Diagnosis not present

## 2020-04-19 DIAGNOSIS — I10 Essential (primary) hypertension: Secondary | ICD-10-CM | POA: Diagnosis not present

## 2020-04-20 DIAGNOSIS — Z23 Encounter for immunization: Secondary | ICD-10-CM | POA: Diagnosis not present

## 2020-04-20 DIAGNOSIS — N186 End stage renal disease: Secondary | ICD-10-CM | POA: Diagnosis not present

## 2020-04-20 DIAGNOSIS — Z992 Dependence on renal dialysis: Secondary | ICD-10-CM | POA: Diagnosis not present

## 2020-04-20 DIAGNOSIS — N2581 Secondary hyperparathyroidism of renal origin: Secondary | ICD-10-CM | POA: Diagnosis not present

## 2020-04-20 DIAGNOSIS — I158 Other secondary hypertension: Secondary | ICD-10-CM | POA: Diagnosis not present

## 2020-04-20 DIAGNOSIS — D631 Anemia in chronic kidney disease: Secondary | ICD-10-CM | POA: Diagnosis not present

## 2020-04-23 DIAGNOSIS — Z992 Dependence on renal dialysis: Secondary | ICD-10-CM | POA: Diagnosis not present

## 2020-04-23 DIAGNOSIS — D631 Anemia in chronic kidney disease: Secondary | ICD-10-CM | POA: Diagnosis not present

## 2020-04-23 DIAGNOSIS — N2581 Secondary hyperparathyroidism of renal origin: Secondary | ICD-10-CM | POA: Diagnosis not present

## 2020-04-23 DIAGNOSIS — N186 End stage renal disease: Secondary | ICD-10-CM | POA: Diagnosis not present

## 2020-04-23 DIAGNOSIS — Z23 Encounter for immunization: Secondary | ICD-10-CM | POA: Diagnosis not present

## 2020-04-24 ENCOUNTER — Other Ambulatory Visit: Payer: Self-pay

## 2020-04-24 ENCOUNTER — Ambulatory Visit: Payer: Medicare Other

## 2020-04-24 ENCOUNTER — Ambulatory Visit: Payer: Medicare Other | Admitting: Pharmacist

## 2020-04-24 DIAGNOSIS — R0989 Other specified symptoms and signs involving the circulatory and respiratory systems: Secondary | ICD-10-CM

## 2020-04-24 DIAGNOSIS — Z5181 Encounter for therapeutic drug level monitoring: Secondary | ICD-10-CM

## 2020-04-24 DIAGNOSIS — I480 Paroxysmal atrial fibrillation: Secondary | ICD-10-CM

## 2020-04-24 DIAGNOSIS — Z7901 Long term (current) use of anticoagulants: Secondary | ICD-10-CM

## 2020-04-24 DIAGNOSIS — I6523 Occlusion and stenosis of bilateral carotid arteries: Secondary | ICD-10-CM

## 2020-04-24 LAB — POCT INR: INR: 2.3 (ref 2.0–3.0)

## 2020-04-24 NOTE — Progress Notes (Signed)
Anticoagulation Management Jane [name] is a 74 y.o. female who reports to the clinic for monitoring of warfarin treatment.    Indication: atrial fibrillation CHA2DS2 Vasc Score 4 (Age >56, female, HTN hx), HAS-BLED 2 (Age>65, renal disease)  Duration: indefinite Supervising physician: Adrian Prows  Anticoagulation Clinic Visit History:  Patient does not report signs/symptoms of bleeding or thromboembolism.  Other recent changes: No change in diet, medications, lifestyle. Pt reports that she had a yeast infection 2 weeks ago and was started on diflucan x1. Pt also complaining of severe back pain and knee pain over the past few days. Managed with PRN Norco. Denies any recent intake of NSAIDs.  Pt also complaining of poor appetite and complains of fatigue and lack of energy. Pt has an upcoming PCP appt to f/u on her general complains.   Anticoagulation Episode Summary    Current INR goal:  2.0-3.0  TTR:  79.9 % (1.6 y)  Next INR check:  05/22/2020  INR from last check:  2.3 (04/24/2020)  Weekly max warfarin dose:    Target end date:  Indefinite  INR check location:    Preferred lab:    Send INR reminders to:     Indications   Paroxysmal atrial fibrillation (HCC) [I48.0] Monitoring for long-term anticoagulant use [Z51.81 Z79.01]       Comments:          Allergies  Allergen Reactions  . Latex Rash  . Penicillins Other (See Comments)    Yeast infection / Childhood  . Sulfa Antibiotics Rash  . Tape Other (See Comments)    Plastic, silicone, and paper tape causes bruising and pulls off skin. Cloth tape works fine    Current Outpatient Medications:  .  amLODipine (NORVASC) 5 MG tablet, Take 5 mg by mouth daily. Taking it at night, Disp: , Rfl:  .  carvedilol (COREG) 12.5 MG tablet, Take 1 tablet (12.5 mg total) by mouth 2 (two) times daily with a meal., Disp: 60 tablet, Rfl: 2 .  hydrALAZINE (APRESOLINE) 10 MG tablet, Take 10 mg by mouth 3 (three) times daily. , Disp: , Rfl:  .   isosorbide dinitrate (ISORDIL) 30 MG tablet, Take 1 tablet (30 mg total) by mouth 3 (three) times daily. (Patient taking differently: Take 30 mg by mouth 2 (two) times daily. ), Disp: 90 tablet, Rfl: 2 .  linaclotide (LINZESS) 145 MCG CAPS capsule, Take 1 capsule (145 mcg total) by mouth daily before breakfast., Disp: 30 capsule, Rfl: 3 .  warfarin (COUMADIN) 5 MG tablet, As directed by coumadin clinic. (Patient taking differently: Take 5-7.5 mg by mouth See admin instructions. Take  7.5 mg every Mon, Wed, Fri; and 5 mg all other days. Refer to most recent anticoagulation clinic note for most accurate dosing instructions.), Disp: 113 tablet, Rfl: 2 .  acetaminophen (TYLENOL) 500 MG tablet, Take 1,500 mg by mouth daily as needed for headache., Disp: , Rfl:  .  albuterol (PROVENTIL HFA;VENTOLIN HFA) 108 (90 Base) MCG/ACT inhaler, Inhale 1 puff into the lungs every 6 (six) hours as needed for wheezing or shortness of breath. (Patient taking differently: Inhale 2 puffs into the lungs every 6 (six) hours as needed for wheezing or shortness of breath. ), Disp: 6.7 g, Rfl: 2 .  DULoxetine (CYMBALTA) 20 MG capsule, Take 1 capsule (20 mg total) by mouth daily., Disp: 30 capsule, Rfl: 1 .  ethyl chloride spray, Apply 1 application topically daily as needed (port access). , Disp: , Rfl: 12 .  HYDROcodone-acetaminophen (NORCO/VICODIN) 5-325 MG tablet, Take 1 tablet by mouth every 6 (six) hours as needed for moderate pain., Disp: 10 tablet, Rfl: 0 .  multivitamin (RENA-VIT) TABS tablet, Take 1 tablet by mouth at bedtime. , Disp: , Rfl:  .  omeprazole (PRILOSEC) 40 MG capsule, TAKE 1 CAPSULE BY MOUTH TWICE DAILY BEFORE A MEAL, Disp: 60 capsule, Rfl: 0 .  sevelamer carbonate (RENVELA) 800 MG tablet, Take 1,600 mg by mouth in the morning and at bedtime. , Disp: , Rfl:  Past Medical History:  Diagnosis Date  . Acid reflux   . Anemia of chronic disease   . Arthritis   . Asthma   . Atrial fibrillation ([place])   .  Bilateral carotid bruits   . Complication of anesthesia    "hard to wake up, I have sleep apnea" no CPAP  . Depression   . Diverticulitis   . Duodenal ulcer   . Dysrhythmia    Afib  . ESRD (end stage renal disease) ([place])    MWF [place]  . Headache   . History of blood transfusion   . Hypertension   . Malaise and fatigue   . Orthostatic hypotension   . Shortness of breath    " when I walk to fast"  . Sleep apnea   . Syncope   . Tubulovillous adenoma of colon    ASSESSMENT  Recent Results: The most recent result is correlated with 42.5 mg per week:  Lab Results  Component Value Date   INR 2.3 04/24/2020   INR 1.8 (A) 04/12/2020   INR 2.0 (H) 04/10/2020    Anticoagulation Dosing: Description   INR at goal. Continue taking 7.5 mg every Mon, Wed, Fri and 5 mg all other days. Recheck in 4 weeks.      INR today: Therapeutic. Pt continues to remain therapeutic on current dose. Denies any significant bleeding/bruising symptoms. Denies any recent changes in diet, medications, lifestyle. Recent diflucan dose over 2 weeks ago. No other relevant medication changes.    PLAN Weekly dose was unchanged. Continue taking 7.5 mg every Mon, Wed, Fri and 5 mg all other days. Recheck INR in 4 weeks.     Patient Instructions  INR at goal. Continue taking 7.5 mg every Mon, Wed, Fri and 5 mg all other days. Recheck in 4 weeks.  Patient advised to contact clinic or seek medical attention if signs/symptoms of bleeding or thromboembolism occur.  Patient verbalized understanding by repeating back information and was advised to contact me if further medication-related questions arise.   Follow-up Return in about 4 weeks (around 05/22/2020).  [name], PharmD  15 minutes spent face-to-face with the patient during the encounter. 50% of time spent on education, including signs/sx bleeding and clotting, as well as food and drug interactions with warfarin. 50% of time was spent on  fingerprick POC INR sample collection,processing, results determination, and documentation

## 2020-04-24 NOTE — Patient Instructions (Signed)
INR at goal. Continue taking 7.5 mg every Mon, Wed, Fri and 5 mg all other days. Recheck in 4 weeks.

## 2020-04-25 ENCOUNTER — Other Ambulatory Visit: Payer: Self-pay | Admitting: Cardiology

## 2020-04-25 DIAGNOSIS — Z23 Encounter for immunization: Secondary | ICD-10-CM | POA: Diagnosis not present

## 2020-04-25 DIAGNOSIS — N186 End stage renal disease: Secondary | ICD-10-CM | POA: Diagnosis not present

## 2020-04-25 DIAGNOSIS — N2581 Secondary hyperparathyroidism of renal origin: Secondary | ICD-10-CM | POA: Diagnosis not present

## 2020-04-25 DIAGNOSIS — Z992 Dependence on renal dialysis: Secondary | ICD-10-CM | POA: Diagnosis not present

## 2020-04-25 DIAGNOSIS — I6523 Occlusion and stenosis of bilateral carotid arteries: Secondary | ICD-10-CM

## 2020-04-25 DIAGNOSIS — D631 Anemia in chronic kidney disease: Secondary | ICD-10-CM | POA: Diagnosis not present

## 2020-04-27 DIAGNOSIS — Z23 Encounter for immunization: Secondary | ICD-10-CM | POA: Diagnosis not present

## 2020-04-27 DIAGNOSIS — N2581 Secondary hyperparathyroidism of renal origin: Secondary | ICD-10-CM | POA: Diagnosis not present

## 2020-04-27 DIAGNOSIS — N186 End stage renal disease: Secondary | ICD-10-CM | POA: Diagnosis not present

## 2020-04-27 DIAGNOSIS — D631 Anemia in chronic kidney disease: Secondary | ICD-10-CM | POA: Diagnosis not present

## 2020-04-27 DIAGNOSIS — Z992 Dependence on renal dialysis: Secondary | ICD-10-CM | POA: Diagnosis not present

## 2020-04-30 DIAGNOSIS — N186 End stage renal disease: Secondary | ICD-10-CM | POA: Diagnosis not present

## 2020-04-30 DIAGNOSIS — D631 Anemia in chronic kidney disease: Secondary | ICD-10-CM | POA: Diagnosis not present

## 2020-04-30 DIAGNOSIS — N2581 Secondary hyperparathyroidism of renal origin: Secondary | ICD-10-CM | POA: Diagnosis not present

## 2020-04-30 DIAGNOSIS — Z23 Encounter for immunization: Secondary | ICD-10-CM | POA: Diagnosis not present

## 2020-04-30 DIAGNOSIS — Z992 Dependence on renal dialysis: Secondary | ICD-10-CM | POA: Diagnosis not present

## 2020-05-02 DIAGNOSIS — Z23 Encounter for immunization: Secondary | ICD-10-CM | POA: Diagnosis not present

## 2020-05-02 DIAGNOSIS — N2581 Secondary hyperparathyroidism of renal origin: Secondary | ICD-10-CM | POA: Diagnosis not present

## 2020-05-02 DIAGNOSIS — I4891 Unspecified atrial fibrillation: Secondary | ICD-10-CM | POA: Diagnosis not present

## 2020-05-02 DIAGNOSIS — Z992 Dependence on renal dialysis: Secondary | ICD-10-CM | POA: Diagnosis not present

## 2020-05-02 DIAGNOSIS — N186 End stage renal disease: Secondary | ICD-10-CM | POA: Diagnosis not present

## 2020-05-02 DIAGNOSIS — E1129 Type 2 diabetes mellitus with other diabetic kidney complication: Secondary | ICD-10-CM | POA: Diagnosis not present

## 2020-05-02 DIAGNOSIS — D631 Anemia in chronic kidney disease: Secondary | ICD-10-CM | POA: Diagnosis not present

## 2020-05-04 DIAGNOSIS — Z992 Dependence on renal dialysis: Secondary | ICD-10-CM | POA: Diagnosis not present

## 2020-05-04 DIAGNOSIS — D631 Anemia in chronic kidney disease: Secondary | ICD-10-CM | POA: Diagnosis not present

## 2020-05-04 DIAGNOSIS — N186 End stage renal disease: Secondary | ICD-10-CM | POA: Diagnosis not present

## 2020-05-04 DIAGNOSIS — N2581 Secondary hyperparathyroidism of renal origin: Secondary | ICD-10-CM | POA: Diagnosis not present

## 2020-05-04 DIAGNOSIS — Z23 Encounter for immunization: Secondary | ICD-10-CM | POA: Diagnosis not present

## 2020-05-07 DIAGNOSIS — Z23 Encounter for immunization: Secondary | ICD-10-CM | POA: Diagnosis not present

## 2020-05-07 DIAGNOSIS — N186 End stage renal disease: Secondary | ICD-10-CM | POA: Diagnosis not present

## 2020-05-07 DIAGNOSIS — Z992 Dependence on renal dialysis: Secondary | ICD-10-CM | POA: Diagnosis not present

## 2020-05-07 DIAGNOSIS — N2581 Secondary hyperparathyroidism of renal origin: Secondary | ICD-10-CM | POA: Diagnosis not present

## 2020-05-07 DIAGNOSIS — D631 Anemia in chronic kidney disease: Secondary | ICD-10-CM | POA: Diagnosis not present

## 2020-05-08 ENCOUNTER — Other Ambulatory Visit: Payer: Self-pay

## 2020-05-08 ENCOUNTER — Encounter: Payer: Medicare Other | Admitting: Family Medicine

## 2020-05-08 DIAGNOSIS — I129 Hypertensive chronic kidney disease with stage 1 through stage 4 chronic kidney disease, or unspecified chronic kidney disease: Secondary | ICD-10-CM

## 2020-05-08 DIAGNOSIS — G894 Chronic pain syndrome: Secondary | ICD-10-CM

## 2020-05-08 DIAGNOSIS — I480 Paroxysmal atrial fibrillation: Secondary | ICD-10-CM

## 2020-05-08 DIAGNOSIS — F331 Major depressive disorder, recurrent, moderate: Secondary | ICD-10-CM

## 2020-05-09 DIAGNOSIS — Z23 Encounter for immunization: Secondary | ICD-10-CM | POA: Diagnosis not present

## 2020-05-09 DIAGNOSIS — N2581 Secondary hyperparathyroidism of renal origin: Secondary | ICD-10-CM | POA: Diagnosis not present

## 2020-05-09 DIAGNOSIS — Z992 Dependence on renal dialysis: Secondary | ICD-10-CM | POA: Diagnosis not present

## 2020-05-09 DIAGNOSIS — N186 End stage renal disease: Secondary | ICD-10-CM | POA: Diagnosis not present

## 2020-05-09 DIAGNOSIS — D631 Anemia in chronic kidney disease: Secondary | ICD-10-CM | POA: Diagnosis not present

## 2020-05-11 DIAGNOSIS — N2581 Secondary hyperparathyroidism of renal origin: Secondary | ICD-10-CM | POA: Diagnosis not present

## 2020-05-11 DIAGNOSIS — N186 End stage renal disease: Secondary | ICD-10-CM | POA: Diagnosis not present

## 2020-05-11 DIAGNOSIS — Z23 Encounter for immunization: Secondary | ICD-10-CM | POA: Diagnosis not present

## 2020-05-11 DIAGNOSIS — Z992 Dependence on renal dialysis: Secondary | ICD-10-CM | POA: Diagnosis not present

## 2020-05-11 DIAGNOSIS — D631 Anemia in chronic kidney disease: Secondary | ICD-10-CM | POA: Diagnosis not present

## 2020-05-14 DIAGNOSIS — N186 End stage renal disease: Secondary | ICD-10-CM | POA: Diagnosis not present

## 2020-05-14 DIAGNOSIS — Z992 Dependence on renal dialysis: Secondary | ICD-10-CM | POA: Diagnosis not present

## 2020-05-14 DIAGNOSIS — N2581 Secondary hyperparathyroidism of renal origin: Secondary | ICD-10-CM | POA: Diagnosis not present

## 2020-05-14 DIAGNOSIS — D631 Anemia in chronic kidney disease: Secondary | ICD-10-CM | POA: Diagnosis not present

## 2020-05-14 DIAGNOSIS — Z23 Encounter for immunization: Secondary | ICD-10-CM | POA: Diagnosis not present

## 2020-05-16 DIAGNOSIS — D631 Anemia in chronic kidney disease: Secondary | ICD-10-CM | POA: Diagnosis not present

## 2020-05-16 DIAGNOSIS — N186 End stage renal disease: Secondary | ICD-10-CM | POA: Diagnosis not present

## 2020-05-16 DIAGNOSIS — N2581 Secondary hyperparathyroidism of renal origin: Secondary | ICD-10-CM | POA: Diagnosis not present

## 2020-05-16 DIAGNOSIS — Z992 Dependence on renal dialysis: Secondary | ICD-10-CM | POA: Diagnosis not present

## 2020-05-16 DIAGNOSIS — Z23 Encounter for immunization: Secondary | ICD-10-CM | POA: Diagnosis not present

## 2020-05-18 DIAGNOSIS — D631 Anemia in chronic kidney disease: Secondary | ICD-10-CM | POA: Diagnosis not present

## 2020-05-18 DIAGNOSIS — Z23 Encounter for immunization: Secondary | ICD-10-CM | POA: Diagnosis not present

## 2020-05-18 DIAGNOSIS — Z992 Dependence on renal dialysis: Secondary | ICD-10-CM | POA: Diagnosis not present

## 2020-05-18 DIAGNOSIS — N2581 Secondary hyperparathyroidism of renal origin: Secondary | ICD-10-CM | POA: Diagnosis not present

## 2020-05-18 DIAGNOSIS — N186 End stage renal disease: Secondary | ICD-10-CM | POA: Diagnosis not present

## 2020-05-20 DIAGNOSIS — I10 Essential (primary) hypertension: Secondary | ICD-10-CM | POA: Diagnosis not present

## 2020-05-20 DIAGNOSIS — I480 Paroxysmal atrial fibrillation: Secondary | ICD-10-CM | POA: Diagnosis not present

## 2020-05-21 DIAGNOSIS — N2581 Secondary hyperparathyroidism of renal origin: Secondary | ICD-10-CM | POA: Diagnosis not present

## 2020-05-21 DIAGNOSIS — I158 Other secondary hypertension: Secondary | ICD-10-CM | POA: Diagnosis not present

## 2020-05-21 DIAGNOSIS — N186 End stage renal disease: Secondary | ICD-10-CM | POA: Diagnosis not present

## 2020-05-21 DIAGNOSIS — Z992 Dependence on renal dialysis: Secondary | ICD-10-CM | POA: Diagnosis not present

## 2020-05-21 DIAGNOSIS — D631 Anemia in chronic kidney disease: Secondary | ICD-10-CM | POA: Diagnosis not present

## 2020-05-22 ENCOUNTER — Ambulatory Visit: Payer: Medicare Other | Admitting: Pharmacist

## 2020-05-22 ENCOUNTER — Other Ambulatory Visit: Payer: Self-pay

## 2020-05-22 DIAGNOSIS — I480 Paroxysmal atrial fibrillation: Secondary | ICD-10-CM

## 2020-05-22 DIAGNOSIS — Z5181 Encounter for therapeutic drug level monitoring: Secondary | ICD-10-CM

## 2020-05-22 DIAGNOSIS — Z7901 Long term (current) use of anticoagulants: Secondary | ICD-10-CM | POA: Diagnosis not present

## 2020-05-22 LAB — POCT INR: INR: 1.7 — AB (ref 2.0–3.0)

## 2020-05-22 NOTE — Progress Notes (Signed)
Anticoagulation Management Jane [name] is a 74 y.o. female who reports to the clinic for monitoring of warfarin treatment.    Indication: atrial fibrillation CHA2DS2 Vasc Score 4 (Age >93, female, HTN hx), HAS-BLED 2 (Age>65, renal disease)  Duration: indefinite Supervising physician: Adrian Prows  Anticoagulation Clinic Visit History:  Patient does not report signs/symptoms of bleeding or thromboembolism.  Other recent changes: No change in diet, medications, lifestyle. Pt stated that she missed dose x1 last week due to complains of not feeling well and falling asleep early. Pt also reports that her appetite has improved since last INR check.   Anticoagulation Episode Summary    Current INR goal:  2.0-3.0  TTR:  78.6 % (1.6 y)  Next INR check:  06/11/2020  INR from last check:  1.7 (05/22/2020)  Weekly max warfarin dose:    Target end date:  Indefinite  INR check location:    Preferred lab:    Send INR reminders to:     Indications   Paroxysmal atrial fibrillation (HCC) [I48.0] Monitoring for long-term anticoagulant use [Z51.81 Z79.01]       Comments:          Allergies  Allergen Reactions   Latex Rash   Penicillins Other (See Comments)    Yeast infection / Childhood   Sulfa Antibiotics Rash   Tape Other (See Comments)    Plastic, silicone, and paper tape causes bruising and pulls off skin. Cloth tape works fine    Current Outpatient Medications:    acetaminophen (TYLENOL) 500 MG tablet, Take 1,500 mg by mouth daily as needed for headache., Disp: , Rfl:    albuterol (PROVENTIL HFA;VENTOLIN HFA) 108 (90 Base) MCG/ACT inhaler, Inhale 1 puff into the lungs every 6 (six) hours as needed for wheezing or shortness of breath. (Patient taking differently: Inhale 2 puffs into the lungs every 6 (six) hours as needed for wheezing or shortness of breath. ), Disp: 6.7 g, Rfl: 2   carvedilol (COREG) 12.5 MG tablet, Take 1 tablet (12.5 mg total) by mouth 2 (two) times daily with a  meal., Disp: 60 tablet, Rfl: 2   DULoxetine (CYMBALTA) 20 MG capsule, Take 1 capsule (20 mg total) by mouth daily., Disp: 30 capsule, Rfl: 1   hydrALAZINE (APRESOLINE) 10 MG tablet, Take 10 mg by mouth 3 (three) times daily. , Disp: , Rfl:    HYDROcodone-acetaminophen (NORCO/VICODIN) 5-325 MG tablet, Take 1 tablet by mouth every 6 (six) hours as needed for moderate pain., Disp: 10 tablet, Rfl: 0   isosorbide dinitrate (ISORDIL) 30 MG tablet, Take 1 tablet (30 mg total) by mouth 3 (three) times daily. (Patient taking differently: Take 30 mg by mouth 2 (two) times daily. ), Disp: 90 tablet, Rfl: 2   linaclotide (LINZESS) 145 MCG CAPS capsule, Take 1 capsule (145 mcg total) by mouth daily before breakfast., Disp: 30 capsule, Rfl: 3   multivitamin (RENA-VIT) TABS tablet, Take 1 tablet by mouth at bedtime. , Disp: , Rfl:    omeprazole (PRILOSEC) 40 MG capsule, TAKE 1 CAPSULE BY MOUTH TWICE DAILY BEFORE A MEAL, Disp: 60 capsule, Rfl: 0   sevelamer carbonate (RENVELA) 800 MG tablet, Take 1,600 mg by mouth in the morning and at bedtime. , Disp: , Rfl:    warfarin (COUMADIN) 5 MG tablet, As directed by coumadin clinic. (Patient taking differently: Take 5-7.5 mg by mouth See admin instructions. Take  7.5 mg every Mon, Wed, Fri; and 5 mg all other days. Refer to most recent anticoagulation clinic  note for most accurate dosing instructions.), Disp: 113 tablet, Rfl: 2   amLODipine (NORVASC) 5 MG tablet, Take 5 mg by mouth daily. Taking it at night (Patient not taking: Reported on 05/22/2020), Disp: , Rfl:    ethyl chloride spray, Apply 1 application topically daily as needed (port access). , Disp: , Rfl: 12 Past Medical History:  Diagnosis Date   Acid reflux    Anemia of chronic disease    Arthritis    Asthma    Atrial fibrillation ([place])    Bilateral carotid bruits    Complication of anesthesia    "hard to wake up, I have sleep apnea" no CPAP   Depression    Diverticulitis    Duodenal  ulcer    Dysrhythmia    Afib   ESRD (end stage renal disease) ([place])    MWF [place]   Headache    History of blood transfusion    Hypertension    Malaise and fatigue    Orthostatic hypotension    Shortness of breath    " when I walk to fast"   Sleep apnea    Syncope    Tubulovillous adenoma of colon    ASSESSMENT  Recent Results: The most recent result is correlated with 42.5 mg per week:  Lab Results  Component Value Date   INR 1.7 (A) 05/22/2020   INR 2.3 04/24/2020   INR 1.8 (A) 04/12/2020    Anticoagulation Dosing: Description   INR below goal. Take 7.5 mg today and then continue previous maintenance dose of 7.5 mg every Mon, Wed, Fri and 5 mg all other days. Recheck in 3 weeks.      INR today: Subtherapeutic. Likely in the setting of the recent missed dose and increased appetite. Pt was previously stable on the current weekly dose. Denies any significant bleeding/bruising symptoms. Denies any recent changes in diet, medications, lifestyle. No other relevant medication changes.    PLAN Weekly dose was unchanged by 0% to 42.5 mg/week. Take 7.5 mg today and then continue taking 7.5 mg every Mon, Wed, Fri and 5 mg all other days. Recheck INR in 3 weeks.     Patient Instructions  INR below goal. Take 7.5 mg today and then continue previous maintenance dose of 7.5 mg every Mon, Wed, Fri and 5 mg all other days. Recheck in 3 weeks.  Patient advised to contact clinic or seek medical attention if signs/symptoms of bleeding or thromboembolism occur.  Patient verbalized understanding by repeating back information and was advised to contact me if further medication-related questions arise.   Follow-up Return in about 20 days (around 06/11/2020).  [name], PharmD  15 minutes spent face-to-face with the patient during the encounter. 50% of time spent on education, including signs/sx bleeding and clotting, as well as food and drug interactions with  warfarin. 50% of time was spent on fingerprick POC INR sample collection,processing, results determination, and documentation

## 2020-05-22 NOTE — Patient Instructions (Signed)
INR below goal. Take 7.5 mg today and then continue previous maintenance dose of 7.5 mg every Mon, Wed, Fri and 5 mg all other days. Recheck in 3 weeks.

## 2020-05-23 ENCOUNTER — Other Ambulatory Visit: Payer: Self-pay | Admitting: Gastroenterology

## 2020-05-23 DIAGNOSIS — N186 End stage renal disease: Secondary | ICD-10-CM | POA: Diagnosis not present

## 2020-05-23 DIAGNOSIS — Z992 Dependence on renal dialysis: Secondary | ICD-10-CM | POA: Diagnosis not present

## 2020-05-23 DIAGNOSIS — N2581 Secondary hyperparathyroidism of renal origin: Secondary | ICD-10-CM | POA: Diagnosis not present

## 2020-05-23 DIAGNOSIS — D631 Anemia in chronic kidney disease: Secondary | ICD-10-CM | POA: Diagnosis not present

## 2020-05-25 DIAGNOSIS — Z992 Dependence on renal dialysis: Secondary | ICD-10-CM | POA: Diagnosis not present

## 2020-05-25 DIAGNOSIS — D631 Anemia in chronic kidney disease: Secondary | ICD-10-CM | POA: Diagnosis not present

## 2020-05-25 DIAGNOSIS — N2581 Secondary hyperparathyroidism of renal origin: Secondary | ICD-10-CM | POA: Diagnosis not present

## 2020-05-25 DIAGNOSIS — N186 End stage renal disease: Secondary | ICD-10-CM | POA: Diagnosis not present

## 2020-05-26 DIAGNOSIS — Z23 Encounter for immunization: Secondary | ICD-10-CM | POA: Diagnosis not present

## 2020-05-28 DIAGNOSIS — N186 End stage renal disease: Secondary | ICD-10-CM | POA: Diagnosis not present

## 2020-05-28 DIAGNOSIS — Z992 Dependence on renal dialysis: Secondary | ICD-10-CM | POA: Diagnosis not present

## 2020-05-28 DIAGNOSIS — D631 Anemia in chronic kidney disease: Secondary | ICD-10-CM | POA: Diagnosis not present

## 2020-05-28 DIAGNOSIS — N2581 Secondary hyperparathyroidism of renal origin: Secondary | ICD-10-CM | POA: Diagnosis not present

## 2020-05-29 ENCOUNTER — Encounter: Payer: Self-pay | Admitting: Family Medicine

## 2020-05-29 ENCOUNTER — Ambulatory Visit (INDEPENDENT_AMBULATORY_CARE_PROVIDER_SITE_OTHER): Payer: Medicare Other | Admitting: Family Medicine

## 2020-05-29 ENCOUNTER — Other Ambulatory Visit: Payer: Self-pay

## 2020-05-29 VITALS — BP 120/60 | HR 70 | Temp 98.0°F | Resp 16 | Ht 65.0 in | Wt 226.0 lb

## 2020-05-29 DIAGNOSIS — N186 End stage renal disease: Secondary | ICD-10-CM

## 2020-05-29 DIAGNOSIS — G894 Chronic pain syndrome: Secondary | ICD-10-CM

## 2020-05-29 DIAGNOSIS — Z992 Dependence on renal dialysis: Secondary | ICD-10-CM

## 2020-05-29 DIAGNOSIS — R110 Nausea: Secondary | ICD-10-CM | POA: Insufficient documentation

## 2020-05-29 DIAGNOSIS — I129 Hypertensive chronic kidney disease with stage 1 through stage 4 chronic kidney disease, or unspecified chronic kidney disease: Secondary | ICD-10-CM

## 2020-05-29 DIAGNOSIS — I6523 Occlusion and stenosis of bilateral carotid arteries: Secondary | ICD-10-CM

## 2020-05-29 DIAGNOSIS — K219 Gastro-esophageal reflux disease without esophagitis: Secondary | ICD-10-CM

## 2020-05-29 DIAGNOSIS — K5900 Constipation, unspecified: Secondary | ICD-10-CM

## 2020-05-29 DIAGNOSIS — M159 Polyosteoarthritis, unspecified: Secondary | ICD-10-CM | POA: Diagnosis not present

## 2020-05-29 DIAGNOSIS — Z7901 Long term (current) use of anticoagulants: Secondary | ICD-10-CM

## 2020-05-29 DIAGNOSIS — I480 Paroxysmal atrial fibrillation: Secondary | ICD-10-CM

## 2020-05-29 MED ORDER — HYDROCODONE-ACETAMINOPHEN 5-325 MG PO TABS: 0.5000 | ORAL_TABLET | Freq: Every day | ORAL | 0 refills | Status: DC | PRN

## 2020-05-29 MED ORDER — ONDANSETRON HCL 4 MG PO TABS: 4.0000 mg | ORAL_TABLET | Freq: Two times a day (BID) | ORAL | 0 refills | Status: DC | PRN

## 2020-05-29 MED ORDER — DULOXETINE HCL 20 MG PO CPEP: 40.0000 mg | ORAL_CAPSULE | Freq: Every day | ORAL | 2 refills | Status: DC

## 2020-05-29 MED ORDER — ISOSORBIDE DINITRATE 30 MG PO TABS: 15.0000 mg | ORAL_TABLET | Freq: Two times a day (BID) | ORAL | 2 refills | Status: DC

## 2020-05-29 NOTE — Assessment & Plan Note (Signed)
We discussed some side effects of opioid medication, this could aggravate problem. Continue Linzess 145 mcg daily as needed. Adequate hydration and fiber intake.

## 2020-05-29 NOTE — Assessment & Plan Note (Signed)
We discussed current guidelines in regard to chronic opioid treatment for chronic pain management. Medication contract signed today. PDMP reviewed. Follow-up in 4 to 6 weeks.

## 2020-05-29 NOTE — Assessment & Plan Note (Signed)
Rate and rhythm controlled today. Continue carvedilol 12.5 mg twice daily and Coumadin. Follow with cardiologist.

## 2020-05-29 NOTE — Assessment & Plan Note (Addendum)
She has lost about 3 Lb since her last visit.  We discussed benefits of wt loss as well as adverse effects of obesity. Consistency with healthy diet and physical activity recommended. Tai Chi is a good option for her.

## 2020-05-29 NOTE — Progress Notes (Signed)
HPI: [name] is a 74 y.o. female, who is here today for follow up.   She was last seen on 9/721. Since her last visit she she had vascular procedure, primary repair of right arm AV fistula and excision of skin nodule right forearm on 04/10/20. She has followed with cardiologist.  HTN and ESRD: Still having episodes of hypotension after dialysis. 180-200/90 before dialysis and after it drops to 110/?. Yesterday 145/70. When BP is low she has headache, bitemporal. She is taking meds different than prescribed. She is on Carvedilol 12.5 mg bid, Hydralazine 25 mg bid, and Isosorbide 30 mg bid. She is not longer taking Amlodipine.  Nancy Fetter and Saturday BP's varies  depending of BP's Friday. Max BP during weekends 150/?  If BP < 150/80 She starts with nausea,HA (head feels tight),and lightheadedness.  If BP is low she does not take medications. She skips BP meds night before dialysis and takes them again at 11 am days she has dialysis.  One episode of SOB and CP when BP was 90/70. States that she called cardiologist's office, instructed to increase water intake. Symptoms have resolved.  Still making urine, urinates 2 times per day. She has not noted dysuria or gross hematuria.  Nausea exacerbated by eating,so she is skipping meals. GERD:She takes Prilosec 40 mg daily, she is not having heartburn.  Chronic pain, mainly back and knees pain. Also IP joints of hands and feet.  S/P left knee arthroplasty. Last visit she was started on Duloxetine 20 mg daily and Hydrocodone-Acetaminophen 5-325 mg. Received 5 tabs from pharmacist, I sent a new Rx and she got another 5 tabs (04/10/20).She still has 3 tabs left in bottle.  No falls since her last visit.  Pain is "really bad"" > 10/10", can not move if pain is sever.She takes and pain goes down to 6/10 She takes Tylenol in between.   Review of Systems  Constitutional: Positive for fatigue. Negative for activity change, appetite change  and fever.  HENT: Negative for mouth sores, nosebleeds and sore throat.   Eyes: Negative for redness and visual disturbance.  Respiratory: Negative for cough and wheezing.   Cardiovascular: Negative for chest pain and palpitations.  Gastrointestinal: Positive for nausea. Negative for abdominal pain and vomiting.       Negative for changes in bowel habits.  Musculoskeletal: Positive for arthralgias and back pain.  Skin: Negative for rash and wound.  Neurological: Positive for light-headedness. Negative for syncope, facial asymmetry and weakness.  Psychiatric/Behavioral: Negative for behavioral problems and confusion.  Rest of ROS, see pertinent positives sand negatives in HPI  Current Outpatient Medications on File Prior to Visit  Medication Sig Dispense Refill  . acetaminophen (TYLENOL) 500 MG tablet Take 1,500 mg by mouth daily as needed for headache.    . albuterol (PROVENTIL HFA;VENTOLIN HFA) 108 (90 Base) MCG/ACT inhaler Inhale 1 puff into the lungs every 6 (six) hours as needed for wheezing or shortness of breath. (Patient taking differently: Inhale 2 puffs into the lungs every 6 (six) hours as needed for wheezing or shortness of breath. ) 6.7 g 2  . carvedilol (COREG) 12.5 MG tablet Take 1 tablet (12.5 mg total) by mouth 2 (two) times daily with a meal. 60 tablet 2  . ethyl chloride spray Apply 1 application topically daily as needed (port access).   12  . hydrALAZINE (APRESOLINE) 10 MG tablet Take 10 mg by mouth 3 (three) times daily.     Marland Kitchen linaclotide (LINZESS) 145 MCG  CAPS capsule Take 1 capsule (145 mcg total) by mouth daily before breakfast. 30 capsule 3  . multivitamin (RENA-VIT) TABS tablet Take 1 tablet by mouth at bedtime.     Marland Kitchen omeprazole (PRILOSEC) 40 MG capsule TAKE 1 CAPSULE BY MOUTH TWICE DAILY BEFORE A MEAL 60 capsule 2  . sevelamer carbonate (RENVELA) 800 MG tablet Take 1,600 mg by mouth in the morning and at bedtime.     Marland Kitchen warfarin (COUMADIN) 5 MG tablet As directed by  coumadin clinic. (Patient taking differently: Take 5-7.5 mg by mouth See admin instructions. Take  7.5 mg every Mon, Wed, Fri; and 5 mg all other days. Refer to most recent anticoagulation clinic note for most accurate dosing instructions.) 113 tablet 2   No current facility-administered medications on file prior to visit.   Past Medical History:  Diagnosis Date  . Acid reflux   . Anemia of chronic disease   . Arthritis   . Asthma   . Atrial fibrillation ([place])   . Bilateral carotid bruits   . Complication of anesthesia    "hard to wake up, I have sleep apnea" no CPAP  . Depression   . Diverticulitis   . Duodenal ulcer   . Dysrhythmia    Afib  . ESRD (end stage renal disease) ([place])    MWF [place]  . Headache   . History of blood transfusion   . Hypertension   . Malaise and fatigue   . Orthostatic hypotension   . Shortness of breath    " when I walk to fast"  . Sleep apnea   . Syncope   . Tubulovillous adenoma of colon    Allergies  Allergen Reactions  . Latex Rash  . Penicillins Other (See Comments)    Yeast infection / Childhood  . Sulfa Antibiotics Rash  . Tape Other (See Comments)    Plastic, silicone, and paper tape causes bruising and pulls off skin. Cloth tape works fine    Social History   Socioeconomic History  . Marital status: Divorced    Spouse name: Not on file  . Number of children: 4  . Years of education: 92  . Highest education level: Associate degree: occupational, Hotel manager, or vocational program  Occupational History  . Occupation: Retired  Tobacco Use  . Smoking status: Never Smoker  . Smokeless tobacco: Never Used  Vaping Use  . Vaping Use: Never used  Substance and Sexual Activity  . Alcohol use: No  . Drug use: No  . Sexual activity: Never  Other Topics Concern  . Not on file  Social History Narrative   HH 1   Divorced   Outpatient dialysis Mon, Wed, Fri   4 children: 1 daughter locally is an Designer, multimedia and 3 sons  in [place] Strain: High Risk  . Difficulty of Paying Living Expenses: Very hard  Food Insecurity: No Food Insecurity  . Worried About Charity fundraiser in the Last Year: Never true  . Ran Out of Food in the Last Year: Never true  Transportation Needs: No Transportation Needs  . Lack of Transportation (Medical): No  . Lack of Transportation (Non-Medical): No  Physical Activity:   . Days of Exercise per Week: Not on file  . Minutes of Exercise per Session: Not on file  Stress: Stress Concern Present  . Feeling of Stress : Rather much  Social Connections: Moderately Integrated  . Frequency of  Communication with Friends and Family: More than three times a week  . Frequency of Social Gatherings with Friends and Family: Once a week  . Attends Religious Services: More than 4 times per year  . Active Member of Clubs or Organizations: Yes  . Attends Archivist Meetings: Not on file  . Marital Status: Divorced    Vitals:   05/29/20 0830  BP: 120/60  Pulse: 70  Resp: 16  Temp: 98 F (36.7 C)  SpO2: 93%   Wt Readings from Last 3 Encounters:  05/29/20 226 lb (102.5 kg)  04/12/20 239 lb (108.4 kg)  04/10/20 226 lb (102.5 kg)    Body mass index is 37.61 kg/m.  Physical Exam Vitals and nursing note reviewed.  Constitutional:      General: She is not in acute distress.    Appearance: She is well-developed.  HENT:     Head: Normocephalic and atraumatic.  Eyes:     Conjunctiva/sclera: Conjunctivae normal.     Pupils: Pupils are equal, round, and reactive to light.  Cardiovascular:     Rate and Rhythm: Normal rate and regular rhythm.     Pulses:          Dorsalis pedis pulses are 2+ on the right side and 2+ on the left side.     Heart sounds: Murmur (SEM I-II/VI, LUSB) heard.      Comments: Trace pitting LE edema,bilateral. Pulmonary:     Effort: Pulmonary effort is normal. No respiratory distress.     Breath  sounds: Normal breath sounds.  Abdominal:     Palpations: Abdomen is soft. There is no mass.     Tenderness: There is no abdominal tenderness.  Lymphadenopathy:     Cervical: No cervical adenopathy.  Skin:    General: Skin is warm.     Findings: No erythema or rash.  Neurological:     Mental Status: She is alert and oriented to person, place, and time.     Cranial Nerves: No cranial nerve deficit.     Comments: Stable gait, it is not assisted.  Psychiatric:     Comments: Well groomed, good eye contact.   ASSESSMENT AND PLAN:  Ms. Jane [name] was seen today for follow-up.  No orders of the defined types were placed in this encounter.  Hypertension with renal disease Still having episodes of hypotension. Today isosorbide dinitrate decreased from 30 mg twice daily to 15 mg twice daily, 1/2 tablet. Continue hydralazine 25 mg twice daily and carvedilol 12.5 mg twice daily. Skin medication days of dialysis. Resume medications around 2 PM on dialysis days, recommend checking BP before taking medication and if BP <120/60, she was instructed to hold on medications. Check BP at bedtime. Continue low-salt diet. Instructed about warning signs.  ESRD on hemodialysis [facility]) We discussed some complications of ESRD as well as dialysis. Stressed the importance of high-protein diet. Following with nephrologist.  Generalized osteoarthritis of multiple sites She has tolerated losartan well, current dose is not helping.  So recommend increasing duloxetine dose from 20 mg to 40 mg twice daily. We discussed some side effects. Hydrocodone-acetaminophen 5-325 mg 1/2 tablet daily as needed. She can also take Tylenol 500 mg up to 3 tablets daily.  Constipation We discussed some side effects of opioid medication, this could aggravate problem. Continue Linzess 145 mcg daily as needed. Adequate hydration and fiber intake.  Chronic anticoagulation Following with Coumadin clinic. Instructed to let  provider know about medication changes today.  Chronic  pain disorder We discussed current guidelines in regard to chronic opioid treatment for chronic pain management. Medication contract signed today. PDMP reviewed. Follow-up in 4 to 6 weeks.  GERD (gastroesophageal reflux disease) This could be a contributing factor for nausea. Continue omeprazole 40 mg daily. GERD precautions also recommended.   Paroxysmal atrial fibrillation (HCC) Rate and rhythm controlled today. Continue carvedilol 12.5 mg twice daily and Coumadin. Follow with cardiologist.  Morbid obesity ([place]) She has lost about 3 Lb since her last visit.  We discussed benefits of wt loss as well as adverse effects of obesity. Consistency with healthy diet and physical activity recommended. Tai Chi is a good option for her.   Nausea without vomiting We discussed possible caused. Zofran 4 mg bid prn recommended. Instructed about warning signs.   Spent 52 minutes with pt.  During this time history was obtained and documented, examination was performed, prior labs reviewed, and assessment/plan discussed.   Return in about 6 weeks (around 07/10/2020) for Pain and HTN.  Nalanie Winiecki G. [name], MD  [facility]. [place] office.   A few things to remember from today's visit:   Hypertension with renal disease  Chronic pain disorder  If you need refills please call your pharmacy. Do not use My Chart to request refills or for acute issues that need immediate attention.    Please be sure medication list is accurate. If a new problem present, please set up appointment sooner than planned today.  Decrease Isosorbide from 30 mg 2 times daily to 1/2 tab 2 times daily. Hold on blood pressure meds after dialysis until 2-3 pm, check blood pressure and if under 120/60, hold on medication. Check blood pressure at bedtime.  Continue Hydralazine 25 mg 2 times daily and Carvedilol 12.5 mg 2 times daily.  Improve  protein intake.  Continue taking Hydrocodone 1/2 tab daily as needed. Increase Duloxetine from 20 mg to 40 mg (2 caps daily).  Tai chi for elderly, you can find videos on [employer]. You can try standing on one foot while brushing your teeth and holding on the counter.5 seconds at the time on each foot to start with.

## 2020-05-29 NOTE — Assessment & Plan Note (Signed)
We discussed possible caused. Zofran 4 mg bid prn recommended. Instructed about warning signs.

## 2020-05-29 NOTE — Addendum Note (Signed)
Addended by: [name] T on: 05/29/2020 05:50 PM   Modules accepted: Orders

## 2020-05-29 NOTE — Assessment & Plan Note (Addendum)
We discussed some complications of ESRD as well as dialysis. Stressed the importance of high-protein diet. Following with nephrologist.

## 2020-05-29 NOTE — Patient Instructions (Addendum)
A few things to remember from today's visit:   Hypertension with renal disease  Chronic pain disorder  If you need refills please call your pharmacy. Do not use My Chart to request refills or for acute issues that need immediate attention.    Please be sure medication list is accurate. If a new problem present, please set up appointment sooner than planned today.  Decrease Isosorbide from 30 mg 2 times daily to 1/2 tab 2 times daily. Hold on blood pressure meds after dialysis until 2-3 pm, check blood pressure and if under 120/60, hold on medication. Check blood pressure at bedtime.  Continue Hydralazine 25 mg 2 times daily and Carvedilol 12.5 mg 2 times daily.  Improve protein intake.  Continue taking Hydrocodone 1/2 tab daily as needed. Increase Duloxetine from 20 mg to 40 mg (2 caps daily).  Tai chi for elderly, you can find videos on [employer]. You can try standing on one foot while brushing your teeth and holding on the counter.5 seconds at the time on each foot to start with.

## 2020-05-29 NOTE — Assessment & Plan Note (Signed)
Following with Coumadin clinic. Instructed to let provider know about medication changes today.

## 2020-05-29 NOTE — Assessment & Plan Note (Signed)
This could be a contributing factor for nausea. Continue omeprazole 40 mg daily. GERD precautions also recommended.

## 2020-05-29 NOTE — Assessment & Plan Note (Signed)
She has tolerated losartan well, current dose is not helping.  So recommend increasing duloxetine dose from 20 mg to 40 mg twice daily. We discussed some side effects. Hydrocodone-acetaminophen 5-325 mg 1/2 tablet daily as needed. She can also take Tylenol 500 mg up to 3 tablets daily.

## 2020-05-29 NOTE — Assessment & Plan Note (Signed)
Still having episodes of hypotension. Today isosorbide dinitrate decreased from 30 mg twice daily to 15 mg twice daily, 1/2 tablet. Continue hydralazine 25 mg twice daily and carvedilol 12.5 mg twice daily. Skin medication days of dialysis. Resume medications around 2 PM on dialysis days, recommend checking BP before taking medication and if BP <120/60, she was instructed to hold on medications. Check BP at bedtime. Continue low-salt diet. Instructed about warning signs.

## 2020-05-30 DIAGNOSIS — D631 Anemia in chronic kidney disease: Secondary | ICD-10-CM | POA: Diagnosis not present

## 2020-05-30 DIAGNOSIS — N2581 Secondary hyperparathyroidism of renal origin: Secondary | ICD-10-CM | POA: Diagnosis not present

## 2020-05-30 DIAGNOSIS — I4891 Unspecified atrial fibrillation: Secondary | ICD-10-CM | POA: Diagnosis not present

## 2020-05-30 DIAGNOSIS — N186 End stage renal disease: Secondary | ICD-10-CM | POA: Diagnosis not present

## 2020-05-30 DIAGNOSIS — Z992 Dependence on renal dialysis: Secondary | ICD-10-CM | POA: Diagnosis not present

## 2020-06-01 DIAGNOSIS — D631 Anemia in chronic kidney disease: Secondary | ICD-10-CM | POA: Diagnosis not present

## 2020-06-01 DIAGNOSIS — Z992 Dependence on renal dialysis: Secondary | ICD-10-CM | POA: Diagnosis not present

## 2020-06-01 DIAGNOSIS — N186 End stage renal disease: Secondary | ICD-10-CM | POA: Diagnosis not present

## 2020-06-01 DIAGNOSIS — N2581 Secondary hyperparathyroidism of renal origin: Secondary | ICD-10-CM | POA: Diagnosis not present

## 2020-06-04 ENCOUNTER — Telehealth: Payer: Self-pay | Admitting: Pharmacist

## 2020-06-04 ENCOUNTER — Other Ambulatory Visit: Payer: Self-pay

## 2020-06-04 ENCOUNTER — Ambulatory Visit: Payer: Medicare Other | Admitting: Pharmacist

## 2020-06-04 ENCOUNTER — Other Ambulatory Visit: Payer: Self-pay | Admitting: Pharmacist

## 2020-06-04 DIAGNOSIS — I480 Paroxysmal atrial fibrillation: Secondary | ICD-10-CM | POA: Diagnosis not present

## 2020-06-04 DIAGNOSIS — N2581 Secondary hyperparathyroidism of renal origin: Secondary | ICD-10-CM | POA: Diagnosis not present

## 2020-06-04 DIAGNOSIS — Z7901 Long term (current) use of anticoagulants: Secondary | ICD-10-CM

## 2020-06-04 DIAGNOSIS — N186 End stage renal disease: Secondary | ICD-10-CM | POA: Diagnosis not present

## 2020-06-04 DIAGNOSIS — Z5181 Encounter for therapeutic drug level monitoring: Secondary | ICD-10-CM

## 2020-06-04 DIAGNOSIS — Z992 Dependence on renal dialysis: Secondary | ICD-10-CM | POA: Diagnosis not present

## 2020-06-04 DIAGNOSIS — D631 Anemia in chronic kidney disease: Secondary | ICD-10-CM | POA: Diagnosis not present

## 2020-06-04 LAB — POCT INR: INR: 1.8 — AB (ref 2.0–3.0)

## 2020-06-04 NOTE — Patient Instructions (Signed)
INR below goal. Take 10 mg today and then increase weekly dose to 5 mg every Tue, Thur, Sat and 7.5 mg all other days. Recheck in 2 weeks.

## 2020-06-04 NOTE — Telephone Encounter (Signed)
Med list reviewed and updated 

## 2020-06-04 NOTE — Progress Notes (Addendum)
Anticoagulation Management Jane [name] is a 74 y.o. female who reports to the clinic for monitoring of warfarin treatment.    Indication: atrial fibrillation CHA2DS2 Vasc Score 4 (Age >64, female, HTN hx), HAS-BLED 2 (Age>65, renal disease)  Duration: indefinite Supervising physician: Adrian Prows  Anticoagulation Clinic Visit History:  Patient does not report signs/symptoms of bleeding or thromboembolism.  Other recent changes: No change in diet, medications, lifestyle. Reports that she still having episodes of nausea. Using ondansetron PRN that provides moderate relief.   Anticoagulation Episode Summary    Current INR goal:  2.0-3.0  TTR:  76.9 % (1.7 y)  Next INR check:  06/19/2020  INR from last check:  1.8 (06/04/2020)  Weekly max warfarin dose:    Target end date:  Indefinite  INR check location:    Preferred lab:    Send INR reminders to:     Indications   Paroxysmal atrial fibrillation (HCC) [I48.0] Monitoring for long-term anticoagulant use [Z51.81 Z79.01]       Comments:          Allergies  Allergen Reactions  . Latex Rash  . Penicillins Other (See Comments)    Yeast infection / Childhood  . Sulfa Antibiotics Rash  . Tape Other (See Comments)    Plastic, silicone, and paper tape causes bruising and pulls off skin. Cloth tape works fine    Current Outpatient Medications:  .  acetaminophen (TYLENOL) 500 MG tablet, Take 1,500 mg by mouth daily as needed for headache., Disp: , Rfl:  .  albuterol (PROVENTIL HFA;VENTOLIN HFA) 108 (90 Base) MCG/ACT inhaler, Inhale 1 puff into the lungs every 6 (six) hours as needed for wheezing or shortness of breath. (Patient taking differently: Inhale 2 puffs into the lungs every 6 (six) hours as needed for wheezing or shortness of breath. ), Disp: 6.7 g, Rfl: 2 .  carvedilol (COREG) 12.5 MG tablet, Take 1 tablet (12.5 mg total) by mouth 2 (two) times daily with a meal., Disp: 60 tablet, Rfl: 2 .  DULoxetine (CYMBALTA) 20 MG capsule,  Take 2 capsules (40 mg total) by mouth daily., Disp: 60 capsule, Rfl: 2 .  ethyl chloride spray, Apply 1 application topically daily as needed (port access). , Disp: , Rfl: 12 .  folic acid-vitamin b complex-vitamin c-selenium-zinc (DIALYVITE) 3 MG TABS tablet, Take 1 tablet by mouth daily., Disp: , Rfl:  .  hydrALAZINE (APRESOLINE) 25 MG tablet, Take 25 mg by mouth 3 (three) times daily. , Disp: , Rfl:  .  HYDROcodone-acetaminophen (NORCO/VICODIN) 5-325 MG tablet, Take 0.5 tablets by mouth daily as needed for moderate pain., Disp: 15 tablet, Rfl: 0 .  linaclotide (LINZESS) 145 MCG CAPS capsule, Take 1 capsule (145 mcg total) by mouth daily before breakfast., Disp: 30 capsule, Rfl: 3 .  omeprazole (PRILOSEC) 40 MG capsule, TAKE 1 CAPSULE BY MOUTH TWICE DAILY BEFORE A MEAL, Disp: 60 capsule, Rfl: 2 .  ondansetron (ZOFRAN) 4 MG tablet, Take 1 tablet (4 mg total) by mouth every 12 (twelve) hours as needed for nausea or vomiting., Disp: 30 tablet, Rfl: 0 .  sevelamer carbonate (RENVELA) 800 MG tablet, Take 1,600 mg by mouth in the morning and at bedtime. Taking with meals, Disp: , Rfl:  .  warfarin (COUMADIN) 5 MG tablet, As directed by coumadin clinic. (Patient taking differently: Take 5-7.5 mg by mouth See admin instructions. Take  7.5 mg every Mon, Wed, Fri; and 5 mg all other days. Refer to most recent anticoagulation clinic note for most  accurate dosing instructions.), Disp: 113 tablet, Rfl: 2 .  isosorbide dinitrate (ISORDIL) 30 MG tablet, Take 0.5 tablets (15 mg total) by mouth 2 (two) times daily. (Patient taking differently: Take 30 mg by mouth 3 (three) times daily. ), Disp: 90 tablet, Rfl: 2 Past Medical History:  Diagnosis Date  . Acid reflux   . Anemia of chronic disease   . Arthritis   . Asthma   . Atrial fibrillation ([place])   . Bilateral carotid bruits   . Complication of anesthesia    "hard to wake up, I have sleep apnea" no CPAP  . Depression   . Diverticulitis   . Duodenal ulcer     . Dysrhythmia    Afib  . ESRD (end stage renal disease) ([place])    MWF [facility]  . Headache   . History of blood transfusion   . Hypertension   . Malaise and fatigue   . Orthostatic hypotension   . Shortness of breath    " when I walk to fast"  . Sleep apnea   . Syncope   . Tubulovillous adenoma of colon    ASSESSMENT  Recent Results: The most recent result is correlated with 42.5 mg per week:  Lab Results  Component Value Date   INR 1.8 (A) 06/04/2020   INR 1.7 (A) 05/22/2020   INR 2.3 04/24/2020    Anticoagulation Dosing: Description   INR below goal. Take 10 mg today and then increase weekly dose to 5 mg every Tue, Thur, Sat and 7.5 mg all other days. Recheck in 2 weeks.      INR today: Subtherapeutic. Continues to be subtherapeutic despite boost dose last check. Denies any significant bleeding/bruising symptoms. Denies any recent changes in diet, medications, lifestyle. No other relevant medication changes. No clear reason for continued subtherapeutic reading. Warrants weekly dose increase. Will continue close monitoring and follow-up.   PLAN Weekly dose was increased by 5.9% to 45 mg/week. Take 10 mg today and then increase weekly dose to 5 mg every Tue, Thu, Sat; 7.5 mg all other days. Recheck INR in 2 weeks.     Patient Instructions  INR below goal. Take 10 mg today and then increase weekly dose to 5 mg every Tue, Thur, Sat and 7.5 mg all other days. Recheck in 2 weeks.  Patient advised to contact clinic or seek medical attention if signs/symptoms of bleeding or thromboembolism occur.  Patient verbalized understanding by repeating back information and was advised to contact me if further medication-related questions arise.   Follow-up Return in about 15 days (around 06/19/2020).  [name], PharmD  15 minutes spent face-to-face with the patient during the encounter. 50% of time spent on education, including signs/sx bleeding and clotting, as well as  food and drug interactions with warfarin. 50% of time was spent on fingerprick POC INR sample collection,processing, results determination, and documentation

## 2020-06-06 DIAGNOSIS — N186 End stage renal disease: Secondary | ICD-10-CM | POA: Diagnosis not present

## 2020-06-06 DIAGNOSIS — D631 Anemia in chronic kidney disease: Secondary | ICD-10-CM | POA: Diagnosis not present

## 2020-06-06 DIAGNOSIS — N2581 Secondary hyperparathyroidism of renal origin: Secondary | ICD-10-CM | POA: Diagnosis not present

## 2020-06-06 DIAGNOSIS — Z992 Dependence on renal dialysis: Secondary | ICD-10-CM | POA: Diagnosis not present

## 2020-06-08 DIAGNOSIS — N2581 Secondary hyperparathyroidism of renal origin: Secondary | ICD-10-CM | POA: Diagnosis not present

## 2020-06-08 DIAGNOSIS — D631 Anemia in chronic kidney disease: Secondary | ICD-10-CM | POA: Diagnosis not present

## 2020-06-08 DIAGNOSIS — Z992 Dependence on renal dialysis: Secondary | ICD-10-CM | POA: Diagnosis not present

## 2020-06-08 DIAGNOSIS — N186 End stage renal disease: Secondary | ICD-10-CM | POA: Diagnosis not present

## 2020-06-12 DIAGNOSIS — D631 Anemia in chronic kidney disease: Secondary | ICD-10-CM | POA: Diagnosis not present

## 2020-06-12 DIAGNOSIS — Z992 Dependence on renal dialysis: Secondary | ICD-10-CM | POA: Diagnosis not present

## 2020-06-12 DIAGNOSIS — N186 End stage renal disease: Secondary | ICD-10-CM | POA: Diagnosis not present

## 2020-06-12 DIAGNOSIS — N2581 Secondary hyperparathyroidism of renal origin: Secondary | ICD-10-CM | POA: Diagnosis not present

## 2020-06-15 DIAGNOSIS — Z992 Dependence on renal dialysis: Secondary | ICD-10-CM | POA: Diagnosis not present

## 2020-06-15 DIAGNOSIS — D631 Anemia in chronic kidney disease: Secondary | ICD-10-CM | POA: Diagnosis not present

## 2020-06-15 DIAGNOSIS — N186 End stage renal disease: Secondary | ICD-10-CM | POA: Diagnosis not present

## 2020-06-15 DIAGNOSIS — N2581 Secondary hyperparathyroidism of renal origin: Secondary | ICD-10-CM | POA: Diagnosis not present

## 2020-06-18 DIAGNOSIS — N2581 Secondary hyperparathyroidism of renal origin: Secondary | ICD-10-CM | POA: Diagnosis not present

## 2020-06-18 DIAGNOSIS — D631 Anemia in chronic kidney disease: Secondary | ICD-10-CM | POA: Diagnosis not present

## 2020-06-18 DIAGNOSIS — Z992 Dependence on renal dialysis: Secondary | ICD-10-CM | POA: Diagnosis not present

## 2020-06-18 DIAGNOSIS — N186 End stage renal disease: Secondary | ICD-10-CM | POA: Diagnosis not present

## 2020-06-19 ENCOUNTER — Other Ambulatory Visit: Payer: Self-pay

## 2020-06-19 ENCOUNTER — Ambulatory Visit: Payer: Medicare Other | Admitting: Pharmacist

## 2020-06-19 DIAGNOSIS — Z7901 Long term (current) use of anticoagulants: Secondary | ICD-10-CM | POA: Diagnosis not present

## 2020-06-19 DIAGNOSIS — I480 Paroxysmal atrial fibrillation: Secondary | ICD-10-CM | POA: Diagnosis not present

## 2020-06-19 DIAGNOSIS — Z5181 Encounter for therapeutic drug level monitoring: Secondary | ICD-10-CM | POA: Diagnosis not present

## 2020-06-19 DIAGNOSIS — I10 Essential (primary) hypertension: Secondary | ICD-10-CM | POA: Diagnosis not present

## 2020-06-19 LAB — POCT INR: INR: 3.5 — AB (ref 2.0–3.0)

## 2020-06-19 NOTE — Progress Notes (Signed)
Anticoagulation Management Jane [name] is a 74 y.o. female who reports to the clinic for monitoring of warfarin treatment.    Indication: atrial fibrillation CHA2DS2 Vasc Score 4 (Age >38, female, HTN hx), HAS-BLED 2 (Age>65, renal disease)  Duration: indefinite Supervising physician: Adrian Prows  Anticoagulation Clinic Visit History:  Patient does not report signs/symptoms of bleeding or thromboembolism.  Other recent changes: No change in diet, medications, lifestyle. Continues to have mild episodes of nausea. Denies any recent vomiting episodes. Using ondansetron PRN.   Had a serving of cranberry jam over thanksgiving get together. Denies any intake of Vit K products or alcohol   Anticoagulation Episode Summary    Current INR goal:  2.0-3.0  TTR:  76.4 % (1.7 y)  Next INR check:  07/03/2020  INR from last check:  3.5 (06/19/2020)  Weekly max warfarin dose:    Target end date:  Indefinite  INR check location:    Preferred lab:    Send INR reminders to:     Indications   Paroxysmal atrial fibrillation (HCC) [I48.0] Monitoring for long-term anticoagulant use [Z51.81 Z79.01]       Comments:          Allergies  Allergen Reactions  . Latex Rash  . Penicillins Other (See Comments)    Yeast infection / Childhood  . Sulfa Antibiotics Rash  . Tape Other (See Comments)    Plastic, silicone, and paper tape causes bruising and pulls off skin. Cloth tape works fine    Current Outpatient Medications:  .  acetaminophen (TYLENOL) 500 MG tablet, Take 1,500 mg by mouth daily as needed for headache., Disp: , Rfl:  .  albuterol (PROVENTIL HFA;VENTOLIN HFA) 108 (90 Base) MCG/ACT inhaler, Inhale 1 puff into the lungs every 6 (six) hours as needed for wheezing or shortness of breath. (Patient taking differently: Inhale 2 puffs into the lungs every 6 (six) hours as needed for wheezing or shortness of breath. ), Disp: 6.7 g, Rfl: 2 .  carvedilol (COREG) 12.5 MG tablet, Take 1 tablet (12.5 mg  total) by mouth 2 (two) times daily with a meal., Disp: 60 tablet, Rfl: 2 .  DULoxetine (CYMBALTA) 20 MG capsule, Take 2 capsules (40 mg total) by mouth daily., Disp: 60 capsule, Rfl: 2 .  ethyl chloride spray, Apply 1 application topically daily as needed (port access). , Disp: , Rfl: 12 .  folic acid-vitamin b complex-vitamin c-selenium-zinc (DIALYVITE) 3 MG TABS tablet, Take 1 tablet by mouth daily., Disp: , Rfl:  .  hydrALAZINE (APRESOLINE) 25 MG tablet, Take 25 mg by mouth 3 (three) times daily. , Disp: , Rfl:  .  HYDROcodone-acetaminophen (NORCO/VICODIN) 5-325 MG tablet, Take 0.5 tablets by mouth daily as needed for moderate pain., Disp: 15 tablet, Rfl: 0 .  isosorbide dinitrate (ISORDIL) 30 MG tablet, Take 0.5 tablets (15 mg total) by mouth 2 (two) times daily. (Patient taking differently: Take 30 mg by mouth 3 (three) times daily. ), Disp: 90 tablet, Rfl: 2 .  linaclotide (LINZESS) 145 MCG CAPS capsule, Take 1 capsule (145 mcg total) by mouth daily before breakfast., Disp: 30 capsule, Rfl: 3 .  omeprazole (PRILOSEC) 40 MG capsule, TAKE 1 CAPSULE BY MOUTH TWICE DAILY BEFORE A MEAL, Disp: 60 capsule, Rfl: 2 .  ondansetron (ZOFRAN) 4 MG tablet, Take 1 tablet (4 mg total) by mouth every 12 (twelve) hours as needed for nausea or vomiting., Disp: 30 tablet, Rfl: 0 .  sevelamer carbonate (RENVELA) 800 MG tablet, Take 1,600 mg by mouth  in the morning and at bedtime. Taking with meals, Disp: , Rfl:  .  warfarin (COUMADIN) 5 MG tablet, As directed by coumadin clinic. (Patient taking differently: Take 5-7.5 mg by mouth See admin instructions. Take  7.5 mg every Mon, Wed, Fri; and 5 mg all other days. Refer to most recent anticoagulation clinic note for most accurate dosing instructions.), Disp: 113 tablet, Rfl: 2 Past Medical History:  Diagnosis Date  . Acid reflux   . Anemia of chronic disease   . Arthritis   . Asthma   . Atrial fibrillation ([place])   . Bilateral carotid bruits   . Complication of  anesthesia    "hard to wake up, I have sleep apnea" no CPAP  . Depression   . Diverticulitis   . Duodenal ulcer   . Dysrhythmia    Afib  . ESRD (end stage renal disease) ([place])    MWF [facility] [facility]  . Headache   . History of blood transfusion   . Hypertension   . Malaise and fatigue   . Orthostatic hypotension   . Shortness of breath    " when I walk to fast"  . Sleep apnea   . Syncope   . Tubulovillous adenoma of colon    ASSESSMENT  Recent Results: The most recent result is correlated with 45 mg per week:  Lab Results  Component Value Date   INR 3.5 (A) 06/19/2020   INR 1.8 (A) 06/04/2020   INR 1.7 (A) 05/22/2020    Anticoagulation Dosing: Description   INR above goal. Take 2.5 mg today and then decrease weekly dose to 7.5 mg every Mon, Wed, Fri and 5 mg all other days. Recheck in 2 weeks.      INR today: Supratherapeutic. Following recent boost dose and weekly dose increase. Jump in INR of 1.7 over 2 weeks. Pt was previously subtherapeutic on previous maintenance dose. Recent concerns of labile appetite and pt notes to continue struggling with eating regularly. Due elevated INR following weekly dose increase and inability of pt to be able to maintain regular Vit K intake, will consider returning back to previous weekly dose and continued close monitoring. Denies any significant bleeding/brusing symptoms. Denies any other relevant changes in diet, medications, lifestyle. Does attest to having just a spoonful of cranberry jam over thanksgiving day. Unlikely to cause such INR elevation.   PLAN Weekly dose was decreased by 5.6% to 42.5 mg/week. Take 2.5 mg today and then decrease weekly dose to 7.5 mg every Mon, Wed, Fri, 7.5 mg all other days. Recheck INR in 2 weeks.     Patient Instructions  INR above goal. Take 2.5 mg today and then decrease weekly dose to 7.5 mg every Mon, Wed, Fri and 5 mg all other days. Recheck in 2 weeks.  Patient advised to contact clinic or  seek medical attention if signs/symptoms of bleeding or thromboembolism occur.  Patient verbalized understanding by repeating back information and was advised to contact me if further medication-related questions arise.   Follow-up Return in about 2 weeks (around 07/03/2020).  [name], PharmD  15 minutes spent face-to-face with the patient during the encounter. 50% of time spent on education, including signs/sx bleeding and clotting, as well as food and drug interactions with warfarin. 50% of time was spent on fingerprick POC INR sample collection,processing, results determination, and documentation

## 2020-06-19 NOTE — Patient Instructions (Signed)
INR above goal. Take 2.5 mg today and then decrease weekly dose to 7.5 mg every Mon, Wed, Fri and 5 mg all other days. Recheck in 2 weeks.

## 2020-06-20 DIAGNOSIS — I158 Other secondary hypertension: Secondary | ICD-10-CM | POA: Diagnosis not present

## 2020-06-20 DIAGNOSIS — N2581 Secondary hyperparathyroidism of renal origin: Secondary | ICD-10-CM | POA: Diagnosis not present

## 2020-06-20 DIAGNOSIS — N186 End stage renal disease: Secondary | ICD-10-CM | POA: Diagnosis not present

## 2020-06-20 DIAGNOSIS — Z992 Dependence on renal dialysis: Secondary | ICD-10-CM | POA: Diagnosis not present

## 2020-06-22 DIAGNOSIS — N186 End stage renal disease: Secondary | ICD-10-CM | POA: Diagnosis not present

## 2020-06-22 DIAGNOSIS — Z992 Dependence on renal dialysis: Secondary | ICD-10-CM | POA: Diagnosis not present

## 2020-06-22 DIAGNOSIS — N2581 Secondary hyperparathyroidism of renal origin: Secondary | ICD-10-CM | POA: Diagnosis not present

## 2020-06-25 ENCOUNTER — Other Ambulatory Visit: Payer: Self-pay | Admitting: Cardiology

## 2020-06-25 DIAGNOSIS — N186 End stage renal disease: Secondary | ICD-10-CM | POA: Diagnosis not present

## 2020-06-25 DIAGNOSIS — N2581 Secondary hyperparathyroidism of renal origin: Secondary | ICD-10-CM | POA: Diagnosis not present

## 2020-06-25 DIAGNOSIS — I129 Hypertensive chronic kidney disease with stage 1 through stage 4 chronic kidney disease, or unspecified chronic kidney disease: Secondary | ICD-10-CM

## 2020-06-25 DIAGNOSIS — Z992 Dependence on renal dialysis: Secondary | ICD-10-CM | POA: Diagnosis not present

## 2020-06-27 DIAGNOSIS — N2581 Secondary hyperparathyroidism of renal origin: Secondary | ICD-10-CM | POA: Diagnosis not present

## 2020-06-27 DIAGNOSIS — I4891 Unspecified atrial fibrillation: Secondary | ICD-10-CM | POA: Diagnosis not present

## 2020-06-27 DIAGNOSIS — Z992 Dependence on renal dialysis: Secondary | ICD-10-CM | POA: Diagnosis not present

## 2020-06-27 DIAGNOSIS — N186 End stage renal disease: Secondary | ICD-10-CM | POA: Diagnosis not present

## 2020-06-29 DIAGNOSIS — N186 End stage renal disease: Secondary | ICD-10-CM | POA: Diagnosis not present

## 2020-06-29 DIAGNOSIS — N2581 Secondary hyperparathyroidism of renal origin: Secondary | ICD-10-CM | POA: Diagnosis not present

## 2020-06-29 DIAGNOSIS — Z992 Dependence on renal dialysis: Secondary | ICD-10-CM | POA: Diagnosis not present

## 2020-07-02 DIAGNOSIS — N2581 Secondary hyperparathyroidism of renal origin: Secondary | ICD-10-CM | POA: Diagnosis not present

## 2020-07-02 DIAGNOSIS — N186 End stage renal disease: Secondary | ICD-10-CM | POA: Diagnosis not present

## 2020-07-02 DIAGNOSIS — Z992 Dependence on renal dialysis: Secondary | ICD-10-CM | POA: Diagnosis not present

## 2020-07-03 ENCOUNTER — Ambulatory Visit: Payer: Medicare Other | Admitting: Pharmacist

## 2020-07-03 ENCOUNTER — Other Ambulatory Visit: Payer: Self-pay

## 2020-07-03 DIAGNOSIS — Z7901 Long term (current) use of anticoagulants: Secondary | ICD-10-CM

## 2020-07-03 DIAGNOSIS — Z5181 Encounter for therapeutic drug level monitoring: Secondary | ICD-10-CM

## 2020-07-03 DIAGNOSIS — I480 Paroxysmal atrial fibrillation: Secondary | ICD-10-CM | POA: Diagnosis not present

## 2020-07-03 LAB — POCT INR: INR: 2.9 (ref 2.0–3.0)

## 2020-07-03 NOTE — Patient Instructions (Signed)
INR at goal. Continue taking 7.5 mg every Mon, Wed, Fri and 5 mg all other days. Recheck in 4 weeks.

## 2020-07-03 NOTE — Progress Notes (Signed)
Anticoagulation Management Jane [name] is a 74 y.o. female who reports to the clinic for monitoring of warfarin treatment.    Indication: atrial fibrillation CHA2DS2 Vasc Score 4 (Age >68, female, HTN hx), HAS-BLED 2 (Age>65, renal disease)  Duration: indefinite Supervising physician: Adrian Prows  Anticoagulation Clinic Visit History:  Patient does not report signs/symptoms of bleeding or thromboembolism.  Other recent changes: No change in diet, medications, lifestyle. Continues to have mild episodes of nausea. Denies any recent vomiting episodes. Using ondansetron PRN. Pt also continues to have poor appetite. Unable to tolerate protein supplement due to taste reasons.   Recent complains of neck strain. Using warm compress, tylenol, and neck massager to manage the pain. Pain persistent for the past 3 weeks, pt believes it maybe due to her sleep posture. Denies any recent traumas or injuries.   Anticoagulation Episode Summary    Current INR goal:  2.0-3.0  TTR:  75.1 % (1.8 y)  Next INR check:  08/02/2020  INR from last check:  2.9 (07/03/2020)  Weekly max warfarin dose:    Target end date:  Indefinite  INR check location:    Preferred lab:    Send INR reminders to:     Indications   Paroxysmal atrial fibrillation (HCC) [I48.0] Monitoring for long-term anticoagulant use [Z51.81 Z79.01]       Comments:          Allergies  Allergen Reactions  . Latex Rash  . Penicillins Other (See Comments)    Yeast infection / Childhood  . Sulfa Antibiotics Rash  . Tape Other (See Comments)    Plastic, silicone, and paper tape causes bruising and pulls off skin. Cloth tape works fine    Current Outpatient Medications:  .  acetaminophen (TYLENOL) 500 MG tablet, Take 1,500 mg by mouth daily as needed for headache., Disp: , Rfl:  .  albuterol (PROVENTIL HFA;VENTOLIN HFA) 108 (90 Base) MCG/ACT inhaler, Inhale 1 puff into the lungs every 6 (six) hours as needed for wheezing or shortness of  breath. (Patient taking differently: Inhale 2 puffs into the lungs every 6 (six) hours as needed for wheezing or shortness of breath. ), Disp: 6.7 g, Rfl: 2 .  carvedilol (COREG) 12.5 MG tablet, TAKE 1 TABLET BY MOUTH TWICE DAILY WITH  A  MEAL, Disp: 60 tablet, Rfl: 0 .  DULoxetine (CYMBALTA) 20 MG capsule, Take 2 capsules (40 mg total) by mouth daily., Disp: 60 capsule, Rfl: 2 .  ethyl chloride spray, Apply 1 application topically daily as needed (port access). , Disp: , Rfl: 12 .  folic acid-vitamin b complex-vitamin c-selenium-zinc (DIALYVITE) 3 MG TABS tablet, Take 1 tablet by mouth daily., Disp: , Rfl:  .  hydrALAZINE (APRESOLINE) 25 MG tablet, TAKE 1 TABLET BY MOUTH THREE TIMES DAILY, Disp: 90 tablet, Rfl: 0 .  HYDROcodone-acetaminophen (NORCO/VICODIN) 5-325 MG tablet, Take 0.5 tablets by mouth daily as needed for moderate pain., Disp: 15 tablet, Rfl: 0 .  isosorbide dinitrate (ISORDIL) 30 MG tablet, Take 0.5 tablets (15 mg total) by mouth 2 (two) times daily. (Patient taking differently: Take 30 mg by mouth 3 (three) times daily. ), Disp: 90 tablet, Rfl: 2 .  linaclotide (LINZESS) 145 MCG CAPS capsule, Take 1 capsule (145 mcg total) by mouth daily before breakfast., Disp: 30 capsule, Rfl: 3 .  omeprazole (PRILOSEC) 40 MG capsule, TAKE 1 CAPSULE BY MOUTH TWICE DAILY BEFORE A MEAL, Disp: 60 capsule, Rfl: 2 .  ondansetron (ZOFRAN) 4 MG tablet, Take 1 tablet (4 mg  total) by mouth every 12 (twelve) hours as needed for nausea or vomiting., Disp: 30 tablet, Rfl: 0 .  sevelamer carbonate (RENVELA) 800 MG tablet, Take 1,600 mg by mouth in the morning and at bedtime. Taking with meals, Disp: , Rfl:  .  warfarin (COUMADIN) 5 MG tablet, As directed by coumadin clinic. (Patient taking differently: Take 5-7.5 mg by mouth See admin instructions. Take  7.5 mg every Mon, Wed, Fri; and 5 mg all other days. Refer to most recent anticoagulation clinic note for most accurate dosing instructions.), Disp: 113 tablet,  Rfl: 2 Past Medical History:  Diagnosis Date  . Acid reflux   . Anemia of chronic disease   . Arthritis   . Asthma   . Atrial fibrillation ([place])   . Bilateral carotid bruits   . Complication of anesthesia    "hard to wake up, I have sleep apnea" no CPAP  . Depression   . Diverticulitis   . Duodenal ulcer   . Dysrhythmia    Afib  . ESRD (end stage renal disease) ([place])    MWF [facility]  . Headache   . History of blood transfusion   . Hypertension   . Malaise and fatigue   . Orthostatic hypotension   . Shortness of breath    " when I walk to fast"  . Sleep apnea   . Syncope   . Tubulovillous adenoma of colon    ASSESSMENT  Recent Results: The most recent result is correlated with 42.5 mg per week:  Lab Results  Component Value Date   INR 2.9 07/03/2020   INR 3.5 (A) 06/19/2020   INR 1.8 (A) 06/04/2020    Anticoagulation Dosing: Description   INR at goal. Continue taking 7.5 mg every Mon, Wed, Fri and 5 mg all other days. Recheck in 4 weeks.      INR today: Therapeutic. Following recent weekly dose decrease. Pt states that her appetite concerns remains stable. Denies any complains of bleeding or bruising symptoms. Denies any other relevant changes in her diet, medications, or lifestyle. Denies any recent cranberry use.  Will continue current dose and chronic monitoring.  PLAN Weekly dose was unchanged by 0% to 42.5 mg/week. Continue weekly dose of 7.5 mg every Mon, Wed, Fri, 7.5 mg all other days. Recheck INR in 4 weeks.     Patient Instructions  INR at goal. Continue taking 7.5 mg every Mon, Wed, Fri and 5 mg all other days. Recheck in 4 weeks.  Patient advised to contact clinic or seek medical attention if signs/symptoms of bleeding or thromboembolism occur.  Patient verbalized understanding by repeating back information and was advised to contact me if further medication-related questions arise.   Follow-up Return in about 30 days (around  08/02/2020).  [name], PharmD  15 minutes spent face-to-face with the patient during the encounter. 50% of time spent on education, including signs/sx bleeding and clotting, as well as food and drug interactions with warfarin. 50% of time was spent on fingerprick POC INR sample collection,processing, results determination, and documentation

## 2020-07-04 DIAGNOSIS — N186 End stage renal disease: Secondary | ICD-10-CM | POA: Diagnosis not present

## 2020-07-04 DIAGNOSIS — Z992 Dependence on renal dialysis: Secondary | ICD-10-CM | POA: Diagnosis not present

## 2020-07-04 DIAGNOSIS — N2581 Secondary hyperparathyroidism of renal origin: Secondary | ICD-10-CM | POA: Diagnosis not present

## 2020-07-06 DIAGNOSIS — N186 End stage renal disease: Secondary | ICD-10-CM | POA: Diagnosis not present

## 2020-07-06 DIAGNOSIS — N2581 Secondary hyperparathyroidism of renal origin: Secondary | ICD-10-CM | POA: Diagnosis not present

## 2020-07-06 DIAGNOSIS — Z992 Dependence on renal dialysis: Secondary | ICD-10-CM | POA: Diagnosis not present

## 2020-07-09 DIAGNOSIS — N186 End stage renal disease: Secondary | ICD-10-CM | POA: Diagnosis not present

## 2020-07-09 DIAGNOSIS — N2581 Secondary hyperparathyroidism of renal origin: Secondary | ICD-10-CM | POA: Diagnosis not present

## 2020-07-09 DIAGNOSIS — Z992 Dependence on renal dialysis: Secondary | ICD-10-CM | POA: Diagnosis not present

## 2020-07-10 ENCOUNTER — Other Ambulatory Visit: Payer: Self-pay

## 2020-07-10 ENCOUNTER — Encounter: Payer: Self-pay | Admitting: Family Medicine

## 2020-07-10 ENCOUNTER — Ambulatory Visit (INDEPENDENT_AMBULATORY_CARE_PROVIDER_SITE_OTHER): Payer: Medicare Other | Admitting: Family Medicine

## 2020-07-10 VITALS — BP 134/60 | HR 80 | Temp 98.5°F | Resp 16 | Ht 65.0 in | Wt 225.0 lb

## 2020-07-10 DIAGNOSIS — M159 Polyosteoarthritis, unspecified: Secondary | ICD-10-CM

## 2020-07-10 DIAGNOSIS — Z992 Dependence on renal dialysis: Secondary | ICD-10-CM | POA: Diagnosis not present

## 2020-07-10 DIAGNOSIS — L678 Other hair color and hair shaft abnormalities: Secondary | ICD-10-CM

## 2020-07-10 DIAGNOSIS — N186 End stage renal disease: Secondary | ICD-10-CM

## 2020-07-10 DIAGNOSIS — G894 Chronic pain syndrome: Secondary | ICD-10-CM | POA: Diagnosis not present

## 2020-07-10 DIAGNOSIS — I6523 Occlusion and stenosis of bilateral carotid arteries: Secondary | ICD-10-CM | POA: Diagnosis not present

## 2020-07-10 DIAGNOSIS — I129 Hypertensive chronic kidney disease with stage 1 through stage 4 chronic kidney disease, or unspecified chronic kidney disease: Secondary | ICD-10-CM | POA: Diagnosis not present

## 2020-07-10 MED ORDER — HYDROCODONE-ACETAMINOPHEN 5-325 MG PO TABS: 1.0000 | ORAL_TABLET | Freq: Every day | ORAL | 0 refills | Status: DC | PRN

## 2020-07-10 MED ORDER — HYDRALAZINE HCL 25 MG PO TABS
25.0000 mg | ORAL_TABLET | Freq: Two times a day (BID) | ORAL | 0 refills | Status: DC
Start: 2020-07-10 — End: 2020-08-01

## 2020-07-10 NOTE — Assessment & Plan Note (Addendum)
Medication is helping with pain. We reviewed side effects of chronic opioid use for pain management. She has tolerated medication well. Continue hydrocodone-acetaminophen 5-325 mg daily as needed.

## 2020-07-10 NOTE — Assessment & Plan Note (Addendum)
We reviewed current guidelines for chronic opioid use and pain management. Pain is better controlled. PDMP reviewed. Medication contract is current.

## 2020-07-10 NOTE — Assessment & Plan Note (Addendum)
BP adequately controlled. Continue carvedilol 12.5 mg twice daily, Isordil 30 mg 1/2 tablet twice daily, and hydralazine 25 mg twice daily. Continue monitoring BP regularly.

## 2020-07-10 NOTE — Assessment & Plan Note (Signed)
We discussed a few treatment options, most may not be covered by insurance. Therapy referral placed as requested.

## 2020-07-10 NOTE — Patient Instructions (Signed)
A few things to remember from today's visit:   Abnormal facial hair - Plan: Ambulatory referral to Dermatology  Chronic pain disorder - Plan: HYDROcodone-acetaminophen (NORCO/VICODIN) 5-325 MG tablet  Generalized osteoarthritis of multiple sites - Plan: HYDROcodone-acetaminophen (NORCO/VICODIN) 5-325 MG tablet  Hypertension with renal disease  If you need refills please call your pharmacy. Do not use My Chart to request refills or for acute issues that need immediate attention.   No changes today. Consider discussing with your nephrologist possible peritoneal dialysis.  Please be sure medication list is accurate. If a new problem present, please set up appointment sooner than planned today.

## 2020-07-10 NOTE — Assessment & Plan Note (Signed)
We discussed other dialysis options. Peritoneal dialysis is an option, if interested, recommend discussing this with her nephrologist.

## 2020-07-10 NOTE — Progress Notes (Signed)
HPI: [name] is a 74 y.o. female, who is here today for 1-2 months follow up.   She was last seen on 05/29/2020. Since her last visit she has followed with Coumadin clinic.  Chronic pain: IP/hands,lower back,knees, and IP foot bilateral. Cymbalta was started in 03/2020. Last visit Cymbalta was increased from 20 mg to 40 mg. She has tolerated medication well but she does not think Cymbalta is helping.  Pain is worse in the morning when she first gets up or after prolonged sitting. Pain can be as bad as 10/10. She takes hydrocodone-acetaminophen 5-325 mg when pain is severe.  When she takes medication she has no pain when getting up to go to the bathroom in the meddle of the night. No worsening constipation.   Local heat and TENS help with knee pain.  HTN: Last visit isosorbide was decreased from 30 mg twice daily to 1/2 tablet twice daily. She is also on hydralazine 25 mg twice daily and carvedilol 12.5 mg twice daily. She does not think dialysis BP monitor is accurate. Home BP 159/70, 149/60 before dialysis, at the dialysis center 200's/90's. Negative for severe/frequent headache, visual changes, chest pain, dyspnea,focal weakness, or unusual edema.  She is tired about going to the dialysis center. Not enough techs and waiting outside for hours,"nasty", sometimes she has seen blood stains on chairs. Requesting dermatology referral. Facial rash and hair, folliculitis. She has tried mechanical removal but it has nor helped.  Review of Systems  Constitutional: Positive for fatigue. Negative for activity change, appetite change and fever.  HENT: Negative for mouth sores, nosebleeds and sore throat.   Respiratory: Negative for cough and wheezing.   Cardiovascular: Negative for palpitations.  Gastrointestinal: Positive for nausea (Intermittently, chronic.). Negative for abdominal pain and vomiting.       Negative for changes in bowel habits.  Neurological: Negative for  syncope, facial asymmetry and weakness.  Psychiatric/Behavioral: Negative for confusion. The patient is nervous/anxious.   Rest of ROS, see pertinent positives sand negatives in HPI  Current Outpatient Medications on File Prior to Visit  Medication Sig Dispense Refill  . acetaminophen (TYLENOL) 500 MG tablet Take 1,500 mg by mouth daily as needed for headache.    . albuterol (PROVENTIL HFA;VENTOLIN HFA) 108 (90 Base) MCG/ACT inhaler Inhale 1 puff into the lungs every 6 (six) hours as needed for wheezing or shortness of breath. (Patient taking differently: Inhale 2 puffs into the lungs every 6 (six) hours as needed for wheezing or shortness of breath. ) 6.7 g 2  . carvedilol (COREG) 12.5 MG tablet TAKE 1 TABLET BY MOUTH TWICE DAILY WITH  A  MEAL 60 tablet 0  . DULoxetine (CYMBALTA) 20 MG capsule Take 2 capsules (40 mg total) by mouth daily. 60 capsule 2  . ethyl chloride spray Apply 1 application topically daily as needed (port access).   12  . folic acid-vitamin b complex-vitamin c-selenium-zinc (DIALYVITE) 3 MG TABS tablet Take 1 tablet by mouth daily.    . isosorbide dinitrate (ISORDIL) 30 MG tablet Take 0.5 tablets (15 mg total) by mouth 2 (two) times daily. (Patient taking differently: Take 30 mg by mouth 3 (three) times daily. ) 90 tablet 2  . linaclotide (LINZESS) 145 MCG CAPS capsule Take 1 capsule (145 mcg total) by mouth daily before breakfast. 30 capsule 3  . omeprazole (PRILOSEC) 40 MG capsule TAKE 1 CAPSULE BY MOUTH TWICE DAILY BEFORE A MEAL 60 capsule 2  . ondansetron (ZOFRAN) 4 MG tablet Take  1 tablet (4 mg total) by mouth every 12 (twelve) hours as needed for nausea or vomiting. 30 tablet 0  . sevelamer carbonate (RENVELA) 800 MG tablet Take 1,600 mg by mouth in the morning and at bedtime. Taking with meals    . warfarin (COUMADIN) 5 MG tablet As directed by coumadin clinic. (Patient taking differently: Take 5-7.5 mg by mouth See admin instructions. Take  7.5 mg every Mon, Wed, Fri; 5  mg all other days. Refer to most recent anticoagulation clinic note for most accurate dosing instructions.) 113 tablet 2   No current facility-administered medications on file prior to visit.   Past Medical History:  Diagnosis Date  . Acid reflux   . Anemia of chronic disease   . Arthritis   . Asthma   . Atrial fibrillation ([place])   . Bilateral carotid bruits   . Complication of anesthesia    "hard to wake up, I have sleep apnea" no CPAP  . Depression   . Diverticulitis   . Duodenal ulcer   . Dysrhythmia    Afib  . ESRD (end stage renal disease) ([place])    M[facility]  . Headache   . History of blood transfusion   . Hypertension   . Malaise and fatigue   . Orthostatic hypotension   . Shortness of breath    " when I walk to fast"  . Sleep apnea   . Syncope   . Tubulovillous adenoma of colon    Allergies  Allergen Reactions  . Latex Rash  . Penicillins Other (See Comments)    Yeast infection / Childhood  . Sulfa Antibiotics Rash  . Tape Other (See Comments)    Plastic, silicone, and paper tape causes bruising and pulls off skin. Cloth tape works fine    Social History   Socioeconomic History  . Marital status: Divorced    Spouse name: Not on file  . Number of children: 4  . Years of education: 59  . Highest education level: Associate degree: occupational, Hotel manager, or vocational program  Occupational History  . Occupation: Retired  Tobacco Use  . Smoking status: Never Smoker  . Smokeless tobacco: Never Used  Vaping Use  . Vaping Use: Never used  Substance and Sexual Activity  . Alcohol use: No  . Drug use: No  . Sexual activity: Never  Other Topics Concern  . Not on file  Social History Narrative   HH 1   Divorced   Outpatient dialysis Mon, Wed, Fri   4 children: 1 daughter locally is an Designer, multimedia and 3 sons in [place] Strain: High Risk  . Difficulty of Paying Living Expenses: Very hard   Food Insecurity: No Food Insecurity  . Worried About Charity fundraiser in the Last Year: Never true  . Ran Out of Food in the Last Year: Never true  Transportation Needs: No Transportation Needs  . Lack of Transportation (Medical): No  . Lack of Transportation (Non-Medical): No  Physical Activity: Not on file  Stress: Stress Concern Present  . Feeling of Stress : Rather much  Social Connections: Moderately Integrated  . Frequency of Communication with Friends and Family: More than three times a week  . Frequency of Social Gatherings with Friends and Family: Once a week  . Attends Religious Services: More than 4 times per year  . Active Member of Clubs or Organizations: Yes  . Attends Club or  Organization Meetings: Not on file  . Marital Status: Divorced   Vitals:   07/10/20 0820  BP: 134/60  Pulse: 80  Resp: 16  Temp: 98.5 F (36.9 C)  SpO2: 99%   Body mass index is 37.44 kg/m.  Physical Exam Vitals and nursing note reviewed.  Constitutional:      General: She is not in acute distress.    Appearance: She is well-developed.  HENT:     Head: Normocephalic and atraumatic.     Mouth/Throat:     Mouth: Oropharynx is clear and moist and mucous membranes are normal. Mucous membranes are moist.     Pharynx: Oropharynx is clear.  Eyes:     Conjunctiva/sclera: Conjunctivae normal.  Cardiovascular:     Rate and Rhythm: Normal rate and regular rhythm.     Pulses:          Dorsalis pedis pulses are 2+ on the right side and 2+ on the left side.     Heart sounds: Murmur (SEM I/VI LUSB and RUSB) heard.      Comments: Trace pitting LE edema, bilateral. Pulmonary:     Effort: Pulmonary effort is normal. No respiratory distress.     Breath sounds: Normal breath sounds.  Abdominal:     Palpations: Abdomen is soft.     Tenderness: There is no abdominal tenderness.  Musculoskeletal:        General: No edema.     Lumbar back: Tenderness present. No bony tenderness. Decreased  range of motion.       Back:  Lymphadenopathy:     Cervical: No cervical adenopathy.  Skin:    General: Skin is warm.     Findings: No erythema or rash.     Comments: No active facial lesions, + lower chin hair.  Neurological:     Mental Status: She is alert and oriented to person, place, and time.     Deep Tendon Reflexes: Strength normal.     Comments: Antalgic gait.  Psychiatric:        Mood and Affect: Mood and affect normal.     Comments: Well groomed, good eye contact.   ASSESSMENT AND PLAN:  Ms. Jane [name] was seen today for 1-2 months follow-up.  Orders Placed This Encounter  Procedures  . Ambulatory referral to Dermatology    Hypertension with renal disease BP adequately controlled. Continue carvedilol 12.5 mg twice daily, Isordil 30 mg 1/2 tablet twice daily, and hydralazine 25 mg twice daily. Continue monitoring BP regularly.   ESRD on hemodialysis [facility]) We discussed other dialysis options. Peritoneal dialysis is an option, if interested, recommend discussing this with her nephrologist.  Generalized osteoarthritis of multiple sites Medication is helping with pain. We reviewed side effects of chronic opioid use for pain management. She has tolerated medication well. Continue hydrocodone-acetaminophen 5-325 mg daily as needed.   Chronic pain disorder We reviewed current guidelines for chronic opioid use and pain management. Pain is better controlled. PDMP reviewed. Medication contract is current.  Abnormal facial hair We discussed a few treatment options, most may not be covered by insurance. Therapy referral placed as requested.   Spent 40 minutes.  During this time history was obtained and documented, examination was performed, and assessment/plan discussed.  Return in about 3 months (around 10/08/2020) for HTN,pain.   Renly Guedes G. [name], MD  [facility]. [place] office.  A few things to remember from today's visit:   Abnormal facial  hair - Plan: Ambulatory referral to Dermatology  Chronic pain disorder - Plan: HYDROcodone-acetaminophen (NORCO/VICODIN) 5-325 MG tablet  Generalized osteoarthritis of multiple sites - Plan: HYDROcodone-acetaminophen (NORCO/VICODIN) 5-325 MG tablet  Hypertension with renal disease  If you need refills please call your pharmacy. Do not use My Chart to request refills or for acute issues that need immediate attention.   No changes today. Consider discussing with your nephrologist possible peritoneal dialysis.  Please be sure medication list is accurate. If a new problem present, please set up appointment sooner than planned today.

## 2020-07-11 DIAGNOSIS — N2581 Secondary hyperparathyroidism of renal origin: Secondary | ICD-10-CM | POA: Diagnosis not present

## 2020-07-11 DIAGNOSIS — Z992 Dependence on renal dialysis: Secondary | ICD-10-CM | POA: Diagnosis not present

## 2020-07-11 DIAGNOSIS — N186 End stage renal disease: Secondary | ICD-10-CM | POA: Diagnosis not present

## 2020-07-13 DIAGNOSIS — N186 End stage renal disease: Secondary | ICD-10-CM | POA: Diagnosis not present

## 2020-07-13 DIAGNOSIS — N2581 Secondary hyperparathyroidism of renal origin: Secondary | ICD-10-CM | POA: Diagnosis not present

## 2020-07-13 DIAGNOSIS — Z992 Dependence on renal dialysis: Secondary | ICD-10-CM | POA: Diagnosis not present

## 2020-07-16 DIAGNOSIS — N2581 Secondary hyperparathyroidism of renal origin: Secondary | ICD-10-CM | POA: Diagnosis not present

## 2020-07-16 DIAGNOSIS — Z992 Dependence on renal dialysis: Secondary | ICD-10-CM | POA: Diagnosis not present

## 2020-07-16 DIAGNOSIS — N186 End stage renal disease: Secondary | ICD-10-CM | POA: Diagnosis not present

## 2020-07-18 DIAGNOSIS — Z992 Dependence on renal dialysis: Secondary | ICD-10-CM | POA: Diagnosis not present

## 2020-07-18 DIAGNOSIS — N2581 Secondary hyperparathyroidism of renal origin: Secondary | ICD-10-CM | POA: Diagnosis not present

## 2020-07-18 DIAGNOSIS — N186 End stage renal disease: Secondary | ICD-10-CM | POA: Diagnosis not present

## 2020-07-20 DIAGNOSIS — I10 Essential (primary) hypertension: Secondary | ICD-10-CM | POA: Diagnosis not present

## 2020-07-20 DIAGNOSIS — Z992 Dependence on renal dialysis: Secondary | ICD-10-CM | POA: Diagnosis not present

## 2020-07-20 DIAGNOSIS — N186 End stage renal disease: Secondary | ICD-10-CM | POA: Diagnosis not present

## 2020-07-20 DIAGNOSIS — N2581 Secondary hyperparathyroidism of renal origin: Secondary | ICD-10-CM | POA: Diagnosis not present

## 2020-07-21 DIAGNOSIS — Z992 Dependence on renal dialysis: Secondary | ICD-10-CM | POA: Diagnosis not present

## 2020-07-21 DIAGNOSIS — N186 End stage renal disease: Secondary | ICD-10-CM | POA: Diagnosis not present

## 2020-07-21 DIAGNOSIS — I158 Other secondary hypertension: Secondary | ICD-10-CM | POA: Diagnosis not present

## 2020-07-23 DIAGNOSIS — N186 End stage renal disease: Secondary | ICD-10-CM | POA: Diagnosis not present

## 2020-07-23 DIAGNOSIS — D631 Anemia in chronic kidney disease: Secondary | ICD-10-CM | POA: Diagnosis not present

## 2020-07-23 DIAGNOSIS — Z992 Dependence on renal dialysis: Secondary | ICD-10-CM | POA: Diagnosis not present

## 2020-07-23 DIAGNOSIS — N2581 Secondary hyperparathyroidism of renal origin: Secondary | ICD-10-CM | POA: Diagnosis not present

## 2020-07-23 DIAGNOSIS — D509 Iron deficiency anemia, unspecified: Secondary | ICD-10-CM | POA: Diagnosis not present

## 2020-07-25 DIAGNOSIS — N186 End stage renal disease: Secondary | ICD-10-CM | POA: Diagnosis not present

## 2020-07-25 DIAGNOSIS — N2581 Secondary hyperparathyroidism of renal origin: Secondary | ICD-10-CM | POA: Diagnosis not present

## 2020-07-25 DIAGNOSIS — Z992 Dependence on renal dialysis: Secondary | ICD-10-CM | POA: Diagnosis not present

## 2020-07-25 DIAGNOSIS — D509 Iron deficiency anemia, unspecified: Secondary | ICD-10-CM | POA: Diagnosis not present

## 2020-07-25 DIAGNOSIS — D631 Anemia in chronic kidney disease: Secondary | ICD-10-CM | POA: Diagnosis not present

## 2020-07-27 DIAGNOSIS — N2581 Secondary hyperparathyroidism of renal origin: Secondary | ICD-10-CM | POA: Diagnosis not present

## 2020-07-27 DIAGNOSIS — D509 Iron deficiency anemia, unspecified: Secondary | ICD-10-CM | POA: Diagnosis not present

## 2020-07-27 DIAGNOSIS — D631 Anemia in chronic kidney disease: Secondary | ICD-10-CM | POA: Diagnosis not present

## 2020-07-27 DIAGNOSIS — N186 End stage renal disease: Secondary | ICD-10-CM | POA: Diagnosis not present

## 2020-07-27 DIAGNOSIS — Z992 Dependence on renal dialysis: Secondary | ICD-10-CM | POA: Diagnosis not present

## 2020-07-30 DIAGNOSIS — D509 Iron deficiency anemia, unspecified: Secondary | ICD-10-CM | POA: Diagnosis not present

## 2020-07-30 DIAGNOSIS — N186 End stage renal disease: Secondary | ICD-10-CM | POA: Diagnosis not present

## 2020-07-30 DIAGNOSIS — Z992 Dependence on renal dialysis: Secondary | ICD-10-CM | POA: Diagnosis not present

## 2020-07-30 DIAGNOSIS — D631 Anemia in chronic kidney disease: Secondary | ICD-10-CM | POA: Diagnosis not present

## 2020-07-30 DIAGNOSIS — N2581 Secondary hyperparathyroidism of renal origin: Secondary | ICD-10-CM | POA: Diagnosis not present

## 2020-07-31 ENCOUNTER — Ambulatory Visit (INDEPENDENT_AMBULATORY_CARE_PROVIDER_SITE_OTHER): Payer: Medicare Other

## 2020-07-31 ENCOUNTER — Other Ambulatory Visit: Payer: Self-pay

## 2020-07-31 ENCOUNTER — Ambulatory Visit: Payer: Self-pay

## 2020-07-31 VITALS — BP 130/62 | Temp 98.0°F | Resp 20 | Wt 221.9 lb

## 2020-07-31 DIAGNOSIS — Z0000 Encounter for general adult medical examination without abnormal findings: Secondary | ICD-10-CM | POA: Diagnosis not present

## 2020-07-31 NOTE — Patient Instructions (Addendum)
Jane [name] , Thank you for taking time to come for your Medicare Wellness Visit. I appreciate your ongoing commitment to your health goals. Please review the following plan we discussed and let me know if I can assist you in the future.   Screening recommendations/referrals: Colonoscopy: Done 11/17/19 Mammogram: Done 04/10/20 Bone Density: Done 07/22/15 Recommended yearly ophthalmology/optometry visit for glaucoma screening and checkup Recommended yearly dental visit for hygiene and checkup  Vaccinations: Influenza vaccine: Done 05/07/20 Pneumococcal vaccine: Up to date Tdap vaccine: Up to date Shingles vaccine: Shingrix discussed. Please contact your pharmacy for coverage information.    Covid-19:Completed 2/13, 3/7, & 05/26/20  Advanced directives: Please bring a copy of your health care power of attorney and living will to the office at your convenience.  Conditions/risks identified: exercise more and manage pan for more comfort  Next appointment: Follow up in one year for your annual wellness visit     Preventive Care 65 Years and Older, Female Preventive care refers to lifestyle choices and visits with your health care provider that can promote health and wellness. What does preventive care include?  A yearly physical exam. This is also called an annual well check.  Dental exams once or twice a year.  Routine eye exams. Ask your health care provider how often you should have your eyes checked.  Personal lifestyle choices, including:  Daily care of your teeth and gums.  Regular physical activity.  Eating a healthy diet.  Avoiding tobacco and drug use.  Limiting alcohol use.  Practicing safe sex.  Taking low-dose aspirin every day.  Taking vitamin and mineral supplements as recommended by your health care provider. What happens during an annual well check? The services and screenings done by your health care provider during your annual well check will depend on your  age, overall health, lifestyle risk factors, and family history of disease. Counseling  Your health care provider may ask you questions about your:  Alcohol use.  Tobacco use.  Drug use.  Emotional well-being.  Home and relationship well-being.  Sexual activity.  Eating habits.  History of falls.  Memory and ability to understand (cognition).  Work and work Statistician.  Reproductive health. Screening  You may have the following tests or measurements:  Height, weight, and BMI.  Blood pressure.  Lipid and cholesterol levels. These may be checked every 5 years, or more frequently if you are over 41 years old.  Skin check.  Lung cancer screening. You may have this screening every year starting at age 24 if you have a 30-pack-year history of smoking and currently smoke or have quit within the past 15 years.  Fecal occult blood test (FOBT) of the stool. You may have this test every year starting at age 62.  Flexible sigmoidoscopy or colonoscopy. You may have a sigmoidoscopy every 5 years or a colonoscopy every 10 years starting at age 39.  Hepatitis C blood test.  Hepatitis B blood test.  Sexually transmitted disease (STD) testing.  Diabetes screening. This is done by checking your blood sugar (glucose) after you have not eaten for a while (fasting). You may have this done every 1-3 years.  Bone density scan. This is done to screen for osteoporosis. You may have this done starting at age 68.  Mammogram. This may be done every 1-2 years. Talk to your health care provider about how often you should have regular mammograms. Talk with your health care provider about your test results, treatment options, and if necessary, the  need for more tests. Vaccines  Your health care provider may recommend certain vaccines, such as:  Influenza vaccine. This is recommended every year.  Tetanus, diphtheria, and acellular pertussis (Tdap, Td) vaccine. You may need a Td booster  every 10 years.  Zoster vaccine. You may need this after age 2.  Pneumococcal 13-valent conjugate (PCV13) vaccine. One dose is recommended after age 49.  Pneumococcal polysaccharide (PPSV23) vaccine. One dose is recommended after age 64. Talk to your health care provider about which screenings and vaccines you need and how often you need them. This information is not intended to replace advice given to you by your health care provider. Make sure you discuss any questions you have with your health care provider. Document Released: 08/03/2015 Document Revised: 03/26/2016 Document Reviewed: 05/08/2015 Elsevier Interactive Patient Education  2017 [place] Prevention in the Home Falls can cause injuries. They can happen to people of all ages. There are many things you can do to make your home safe and to help prevent falls. What can I do on the outside of my home?  Regularly fix the edges of walkways and driveways and fix any cracks.  Remove anything that might make you trip as you walk through a door, such as a raised step or threshold.  Trim any bushes or trees on the path to your home.  Use bright outdoor lighting.  Clear any walking paths of anything that might make someone trip, such as rocks or tools.  Regularly check to see if handrails are loose or broken. Make sure that both sides of any steps have handrails.  Any raised decks and porches should have guardrails on the edges.  Have any leaves, snow, or ice cleared regularly.  Use sand or salt on walking paths during winter.  Clean up any spills in your garage right away. This includes oil or grease spills. What can I do in the bathroom?  Use night lights.  Install grab bars by the toilet and in the tub and shower. Do not use towel bars as grab bars.  Use non-skid mats or decals in the tub or shower.  If you need to sit down in the shower, use a plastic, non-slip stool.  Keep the floor dry. Clean up any  water that spills on the floor as soon as it happens.  Remove soap buildup in the tub or shower regularly.  Attach bath mats securely with double-sided non-slip rug tape.  Do not have throw rugs and other things on the floor that can make you trip. What can I do in the bedroom?  Use night lights.  Make sure that you have a light by your bed that is easy to reach.  Do not use any sheets or blankets that are too big for your bed. They should not hang down onto the floor.  Have a firm chair that has side arms. You can use this for support while you get dressed.  Do not have throw rugs and other things on the floor that can make you trip. What can I do in the kitchen?  Clean up any spills right away.  Avoid walking on wet floors.  Keep items that you use a lot in easy-to-reach places.  If you need to reach something above you, use a strong step stool that has a grab bar.  Keep electrical cords out of the way.  Do not use floor polish or wax that makes floors slippery. If you must use wax,  use non-skid floor wax.  Do not have throw rugs and other things on the floor that can make you trip. What can I do with my stairs?  Do not leave any items on the stairs.  Make sure that there are handrails on both sides of the stairs and use them. Fix handrails that are broken or loose. Make sure that handrails are as long as the stairways.  Check any carpeting to make sure that it is firmly attached to the stairs. Fix any carpet that is loose or worn.  Avoid having throw rugs at the top or bottom of the stairs. If you do have throw rugs, attach them to the floor with carpet tape.  Make sure that you have a light switch at the top of the stairs and the bottom of the stairs. If you do not have them, ask someone to add them for you. What else can I do to help prevent falls?  Wear shoes that:  Do not have high heels.  Have rubber bottoms.  Are comfortable and fit you well.  Are closed  at the toe. Do not wear sandals.  If you use a stepladder:  Make sure that it is fully opened. Do not climb a closed stepladder.  Make sure that both sides of the stepladder are locked into place.  Ask someone to hold it for you, if possible.  Clearly mark and make sure that you can see:  Any grab bars or handrails.  First and last steps.  Where the edge of each step is.  Use tools that help you move around (mobility aids) if they are needed. These include:  Canes.  Walkers.  Scooters.  Crutches.  Turn on the lights when you go into a dark area. Replace any light bulbs as soon as they burn out.  Set up your furniture so you have a clear path. Avoid moving your furniture around.  If any of your floors are uneven, fix them.  If there are any pets around you, be aware of where they are.  Review your medicines with your doctor. Some medicines can make you feel dizzy. This can increase your chance of falling. Ask your doctor what other things that you can do to help prevent falls. This information is not intended to replace advice given to you by your health care provider. Make sure you discuss any questions you have with your health care provider. Document Released: 05/03/2009 Document Revised: 12/13/2015 Document Reviewed: 08/11/2014 Elsevier Interactive Patient Education  2017 [employer].

## 2020-07-31 NOTE — Progress Notes (Signed)
Subjective:   Jane [name] is a 75 y.o. female who presents for Medicare Annual (Subsequent) preventive examination.  Review of Systems     Cardiac Risk Factors include: advanced age (>61men, >41 women);hypertension;obesity (BMI >30kg/m2)     Objective:    Today's Vitals   07/31/20 0955 07/31/20 0959  BP: 130/62   Resp: 20   Temp: 98 F (36.7 C)   Weight: 221 lb 14.4 oz (100.7 kg)   PainSc:  10-Worst pain ever   Body mass index is 36.93 kg/m.  Advanced Directives 07/31/2020 01/13/2020 01/12/2020 01/09/2020 07/28/2019 09/17/2017 05/15/2017  Does Patient Have a Medical Advance Directive? Yes Yes Yes Yes Yes Yes Yes  Type of Paramedic of [place];Living will [place];Living will Healthcare Power of [place];Living will Living will;Healthcare Power of [place];Living will [place];Living will  Does patient want to make changes to medical advance directive? - No - Patient declined No - Patient declined - No - Patient declined No - Patient declined -  Copy of [place] in Chart? No - copy requested No - copy requested No - copy requested No - copy requested No - copy requested Yes No - copy requested  Would patient like information on creating a medical advance directive? - No - Patient declined No - Patient declined - - - -  Pre-existing out of facility DNR order (yellow form or pink MOST form) - - - - - - -    Current Medications (verified) Outpatient Encounter Medications as of 07/31/2020  Medication Sig  . acetaminophen (TYLENOL) 500 MG tablet Take 1,500 mg by mouth daily as needed for headache.  . albuterol (PROVENTIL HFA;VENTOLIN HFA) 108 (90 Base) MCG/ACT inhaler Inhale 1 puff into the lungs every 6 (six) hours as needed for wheezing or shortness of breath. (Patient taking differently: Inhale 2 puffs into the lungs every 6 (six) hours as needed for  wheezing or shortness of breath.)  . amLODipine (NORVASC) 5 MG tablet Take 10 mg by mouth daily.  . B Complex-C-Zn-Folic Acid (DIALYVITE 502 WITH ZINC) 0.8 MG TABS Take 1 tablet by mouth daily.  . carvedilol (COREG) 12.5 MG tablet TAKE 1 TABLET BY MOUTH TWICE DAILY WITH  A  MEAL  . doxycycline (VIBRAMYCIN) 100 MG capsule Take 100 mg by mouth 2 (two) times daily.  . DULoxetine (CYMBALTA) 20 MG capsule Take 2 capsules (40 mg total) by mouth daily.  Marland Kitchen ethyl chloride spray Apply 1 application topically daily as needed (port access).   . folic acid-vitamin b complex-vitamin c-selenium-zinc (DIALYVITE) 3 MG TABS tablet Take 1 tablet by mouth daily.  . hydrALAZINE (APRESOLINE) 25 MG tablet Take 1 tablet (25 mg total) by mouth in the morning and at bedtime.  Marland Kitchen HYDROcodone-acetaminophen (NORCO/VICODIN) 5-325 MG tablet Take 1 tablet by mouth daily as needed for moderate pain.  . iron sucrose in sodium chloride 0.9 % 100 mL Iron Sucrose (Venofer)  . isosorbide dinitrate (ISORDIL) 30 MG tablet Take 0.5 tablets (15 mg total) by mouth 2 (two) times daily. (Patient taking differently: Take 30 mg by mouth 3 (three) times daily.)  . linaclotide (LINZESS) 145 MCG CAPS capsule Take 1 capsule (145 mcg total) by mouth daily before breakfast.  . Methoxy PEG-Epoetin Beta (MIRCERA IJ) Mircera  . omeprazole (PRILOSEC) 40 MG capsule TAKE 1 CAPSULE BY MOUTH TWICE DAILY BEFORE A MEAL  . ondansetron (ZOFRAN) 4 MG tablet Take 1  tablet (4 mg total) by mouth every 12 (twelve) hours as needed for nausea or vomiting.  . sevelamer carbonate (RENVELA) 800 MG tablet Take 1,600 mg by mouth in the morning and at bedtime. Taking with meals  . warfarin (COUMADIN) 5 MG tablet As directed by coumadin clinic. (Patient taking differently: Take 5-7.5 mg by mouth See admin instructions. Take  7.5 mg every Mon, Wed, Fri; 5 mg all other days. Refer to most recent anticoagulation clinic note for most accurate dosing instructions.)   No  facility-administered encounter medications on file as of 07/31/2020.    Allergies (verified) Latex, Penicillins, Sulfa antibiotics, and Tape   History: Past Medical History:  Diagnosis Date  . Acid reflux   . Anemia of chronic disease   . Arthritis   . Asthma   . Atrial fibrillation ([place])   . Bilateral carotid bruits   . Complication of anesthesia    "hard to wake up, I have sleep apnea" no CPAP  . Depression   . Diverticulitis   . Duodenal ulcer   . Dysrhythmia    Afib  . ESRD (end stage renal disease) ([place])    MWF [place]  . Headache   . History of blood transfusion   . Hypertension   . Malaise and fatigue   . Orthostatic hypotension   . Shortness of breath    " when I walk to fast"  . Sleep apnea   . Syncope   . Tubulovillous adenoma of colon    Past Surgical History:  Procedure Laterality Date  . AV FISTULA PLACEMENT    . BACK SURGERY     Lumbar fusion L 4 and L 5  . BIOPSY  01/09/2020   Procedure: BIOPSY;  Surgeon: Rush Landmark Telford Nab., MD;  Location: [facility];  Service: Gastroenterology;;  . COLONOSCOPY    . ENDOSCOPIC MUCOSAL RESECTION N/A 01/09/2020   Procedure: ENDOSCOPIC MUCOSAL RESECTION;  Surgeon: Rush Landmark Telford Nab., MD;  Location: [facility];  Service: Gastroenterology;  Laterality: N/A;  . ESOPHAGOGASTRODUODENOSCOPY    . ESOPHAGOGASTRODUODENOSCOPY (EGD) WITH PROPOFOL N/A 01/09/2020   Procedure: ESOPHAGOGASTRODUODENOSCOPY (EGD) WITH PROPOFOL;  Surgeon: Rush Landmark Telford Nab., MD;  Location: [facility];  Service: Gastroenterology;  Laterality: N/A;  . ESOPHAGOGASTRODUODENOSCOPY (EGD) WITH PROPOFOL N/A 01/13/2020   Procedure: ESOPHAGOGASTRODUODENOSCOPY (EGD) WITH PROPOFOL;  Surgeon: Irene Shipper, MD;  Location: [facility];  Service: Gastroenterology;  Laterality: N/A;  . EUS N/A 01/09/2020   Procedure: UPPER ENDOSCOPIC ULTRASOUND (EUS) RADIAL;  Surgeon: Rush Landmark Telford Nab., MD;  Location: [place] [facility];  Service:  Gastroenterology;  Laterality: N/A;  . HEMOSTASIS CLIP PLACEMENT  01/09/2020   Procedure: HEMOSTASIS CLIP PLACEMENT;  Surgeon: Irving Copas., MD;  Location: [place];  Service: Gastroenterology;;  . HEMOSTASIS CONTROL  01/13/2020   Procedure: HEMOSTASIS CONTROL;  Surgeon: Irene Shipper, MD;  Location: [facility] ENDOSCOPY;  Service: Gastroenterology;;  Vassie Loll  . HOT HEMOSTASIS N/A 01/13/2020   Procedure: HOT HEMOSTASIS (ARGON PLASMA COAGULATION/BICAP);  Surgeon: Irene Shipper, MD;  Location: [place];  Service: Gastroenterology;  Laterality: N/A;  . IR GENERIC HISTORICAL  07/09/2016   IR US GUIDE VASC ACCESS RIGHT 07/09/2016 Arne Cleveland, MD [facility]-INTER[facility]  . IR GENERIC HISTORICAL  07/09/2016   IR FLUORO GUIDE CV LINE RIGHT 07/09/2016 Arne Cleveland, MD [facility]  . KNEE ARTHROPLASTY Left   . LAPAROSCOPIC SIGMOID COLECTOMY N/A 07/11/2016   Procedure: LAPAROSCOPIC SIGMOID COLECTOMY;  Surgeon: Clovis Riley, MD;  Location: [place];  Service: General;  Laterality:  N/A;  . MASS EXCISION Right 04/10/2020   Procedure: EXCISION SKIN NODULE RIGHT FOREARM;  Surgeon: Waynetta Sandy, MD;  Location: [place];  Service: Vascular;  Laterality: Right;  . SCLEROTHERAPY  01/09/2020   Procedure: Clide Deutscher;  Surgeon: Mansouraty, Telford Nab., MD;  Location: [place];  Service: Gastroenterology;;  . Clide Deutscher  01/13/2020   Procedure: Clide Deutscher;  Surgeon: Irene Shipper, MD;  Location[facility]COPY;  Service: Gastroenterology;;  . Maryagnes Amos INJECTION  01/09/2020   Procedure: SUBMUCOSAL LIFTING INJECTION;  Surgeon: Irving Copas., MD;  Location: [facility] ENDOSCOPY;  Service: Gastroenterology;;  . TUBAL LIGATION     Family History  Problem Relation Age of Onset  . Heart failure Mother   . Stroke Mother   . Other Father   . Colon cancer Neg Hx   . Liver disease Neg Hx   . Esophageal cancer Neg Hx   . Stomach cancer Neg Hx   . Inflammatory bowel disease Neg Hx    . Rectal cancer Neg Hx   . Pancreatic cancer Neg Hx    Social History   Socioeconomic History  . Marital status: Divorced    Spouse name: Not on file  . Number of children: 4  . Years of education: 33  . Highest education level: Associate degree: occupational, Hotel manager, or vocational program  Occupational History  . Occupation: Retired  Tobacco Use  . Smoking status: Never Smoker  . Smokeless tobacco: Never Used  Vaping Use  . Vaping Use: Never used  Substance and Sexual Activity  . Alcohol use: No  . Drug use: No  . Sexual activity: Never  Other Topics Concern  . Not on file  Social History Narrative   HH 1   Divorced   Outpatient dialysis Mon, Wed, Fri   4 children: 1 daughter locally is an Designer, multimedia and 3 sons in [place] Determinants of Health   Financial Resource Strain: [place]   . Difficulty of Paying Living Expenses: Not hard at all  Food Insecurity: No Food Insecurity  . Worried About Charity fundraiser in the Last Year: Never true  . Ran Out of Food in the Last Year: Never true  Transportation Needs: No Transportation Needs  . Lack of Transportation (Medical): No  . Lack of Transportation (Non-Medical): No  Physical Activity: Inactive  . Days of Exercise per Week: 0 days  . Minutes of Exercise per Session: 0 min  Stress: Stress Concern Present  . Feeling of Stress : To some extent  Social Connections: Moderately Isolated  . Frequency of Communication with Friends and Family: More than three times a week  . Frequency of Social Gatherings with Friends and Family: Never  . Attends Religious Services: More than 4 times per year  . Active Member of Clubs or Organizations: No  . Attends Archivist Meetings: Never  . Marital Status: Divorced    Tobacco Counseling Counseling given: Not Answered   Clinical Intake:  Pre-visit preparation completed: Yes  Pain : 0-10 (back) Pain Score: 10-Worst pain ever Pain Type: Chronic  pain Pain Location: Back Pain Orientation: Lower Pain Descriptors / Indicators: Stabbing,Throbbing Pain Onset: More than a month ago Pain Frequency: Constant     BMI - recorded: 36.93 Nutritional Status: BMI > 30  Obese Nutritional Risks: None Diabetes: No  How often do you need to have someone help you when you read instructions, pamphlets, or other written materials from your doctor or pharmacy?:  1 - Never  Diabetic?No  Interpreter Needed?: No  Information entered by :: Charlott Rakes, LPN   Activities of Daily Living In your present state of health, do you have any difficulty performing the following activities: 07/31/2020 04/10/2020  Hearing? Y N  Comment mild loss -  Vision? N N  Difficulty concentrating or making decisions? N N  Walking or climbing stairs? Y Y  Dressing or bathing? N N  Doing errands, shopping? N -  Preparing Food and eating ? Y -  Comment whenpain is an issue it can be difficult -  Using the Toilet? N -  In the past six months, have you accidently leaked urine? N -  Do you have problems with loss of bowel control? N -  Managing your Medications? N -  Managing your Finances? N -  Housekeeping or managing your Housekeeping? N -  Some recent data might be hidden    Patient Care Team: [name], [name], MD as PCP - General (Family Medicine) Sidonie Dickens, MD as Referring Physician (Psychiatry) [facility] [place] any recent Medical Services you may have received from other than [facility] providers in the past year (date may be approximate).     Assessment:   This is a routine wellness examination for Jane [name].  Hearing/Vision screen  Hearing Screening   125Hz  250Hz  500Hz  1000Hz  2000Hz  3000Hz  4000Hz  6000Hz  8000Hz   Right ear:           Left ear:           Comments: Pt has mild loss  Vision Screening Comments: Pt follows up with eye gallery [place] [address] for eye exams   Dietary issues and exercise activities  discussed: Current Exercise Habits: The patient does not participate in regular exercise at present, Exercise limited by: orthopedic condition(s)  Goals    . connect with resources     Needs resources for assistance with cost of transportation, increase in costs of medications, increase in groceries for high protein diet, increase in rent, and need for shower chair. Referral to community care manager.    . Exercise 3x per week (30 min per time)     Low impact exercise, ideally aquatic exercises. Balance exercises that can be done at home.    . Patient Stated     Exercise more and comfort from pain       Depression Screen PHQ 2/9 Scores 07/31/2020 03/27/2020 03/27/2020 07/28/2019 09/25/2018 05/11/2018 01/26/2018  PHQ - 2 Score 1 1 1 3 2  0 0  PHQ- 9 Score - 8 7 9 13  - -    Fall Risk Fall Risk  07/31/2020 05/29/2020 03/27/2020 07/28/2019 06/09/2018  Falls in the past year? 0 0 1 0 1  Comment - - - - Emmi Telephone Survey: data to providers prior to load  Number falls in past yr: 0 0 1 - 1  Comment - - - - Emmi Telephone Survey Actual Response = 4  Injury with Fall? 0 0 0 - 0  Risk for fall due to : Impaired vision;Impaired mobility;Impaired balance/gait Orthopedic patient History of fall(s);Impaired balance/gait Medication side effect;Other (Comment);Impaired mobility;Impaired vision -  Risk for fall due to: Comment - - - dialysis patient -  Follow up Falls prevention discussed Education provided Education provided Falls evaluation completed;Education provided;Falls prevention discussed -    FALL RISK PREVENTION PERTAINING TO THE HOME:  Any stairs in or around the home? Yes  If so, are there any without handrails? No  Home free of loose throw rugs in walkways, pet beds, electrical cords, etc? Yes  Adequate lighting in your home to reduce risk of falls? Yes   ASSISTIVE DEVICES UTILIZED TO PREVENT FALLS:  Life alert? No  Use of a cane, walker or w/c? Yes  Grab bars in the bathroom? Yes  Shower  chair or bench in shower? No  Elevated toilet seat or a handicapped toilet? Yes   TIMED UP AND GO:  Was the test performed? Yes .  Length of time to ambulate 10 feet: 15 sec.   Gait slow and steady without use of assistive device  Cognitive Function: MMSE - Mini Mental State Exam 10/16/2016  Not completed: (No Data)     6CIT Screen 07/31/2020 07/28/2019  What Year? 0 points 0 points  What month? 0 points 0 points  What time? - 0 points  Count back from 20 - 0 points  Months in reverse - 0 points  Repeat phrase - 0 points  Total Score - 0    Immunizations Immunization History  Administered Date(s) Administered  . Influenza, High Dose Seasonal PF 04/08/2019, 05/07/2020  . Influenza,inj,quad, With Preservative 04/12/2018  . Influenza-Unspecified 04/20/2016, 05/21/2018  . PFIZER SARS-COV-2 Vaccination 09/03/2019, 09/25/2019, 05/26/2020  . Pneumococcal Conjugate-13 07/20/2015  . Pneumococcal Polysaccharide-23 10/12/2012  . Tdap 06/20/2015    TDAP status: Up to date  Flu Vaccine status: Up to date  Pneumococcal vaccine status: Up to date  Covid-19 vaccine status: Completed vaccines  Qualifies for Shingles Vaccine? Yes   Zostavax completed No   Shingrix Completed?: No.    Education has been provided regarding the importance of this vaccine. Patient has been advised to call insurance company to determine out of pocket expense if they have not yet received this vaccine. Advised may also receive vaccine at local pharmacy or Health Dept. Verbalized acceptance and understanding.  Screening Tests Health Maintenance  Topic Date Due  . COVID-19 Vaccine (4 - Booster for Pfizer series) 11/23/2020  . COLONOSCOPY (Pts 45-16yrs Insurance coverage will need to be confirmed)  11/17/2022  . TETANUS/TDAP  06/19/2025  . INFLUENZA VACCINE  Completed  . DEXA SCAN  Completed  . PNA vac Low Risk Adult  Completed  . Hepatitis C Screening  Discontinued    Health Maintenance  There are no  preventive care reminders to display for this patient.  Colorectal cancer screening: Type of screening: Colonoscopy. Completed 11/17/19. Repeat every 3 years  Mammogram status: Completed 04/10/2020. Repeat every year  Bone density 07/22/15  Additional Screening:   Vision Screening: Recommended annual ophthalmology exams for early detection of glaucoma and other disorders of the eye. Is the patient up to date with their annual eye exam?  Yes  Who is the provider or what is the name of the office in which the patient attends annual eye exams? [facility] [address]   Dental Screening: Recommended annual dental exams for proper oral hygiene  Community Resource Referral / Chronic Care Management: CRR required this visit?  No   CCM required this visit?  No      Plan:     I have personally reviewed and noted the following in the patient's chart:   . Medical and social history . Use of alcohol, tobacco or illicit drugs  . Current medications and supplements . Functional ability and status . Nutritional status . Physical activity . Advanced directives . List of other physicians . Hospitalizations, surgeries, and ER visits in previous 12 months .  Vitals . Screenings to include cognitive, depression, and falls . Referrals and appointments  In addition, I have reviewed and discussed with patient certain preventive protocols, quality metrics, and best practice recommendations. A written personalized care plan for preventive services as well as general preventive health recommendations were provided to patient.     Willette Brace, LPN   [id number]/99/[id number]   Nurse Notes: Pt has been taking 3 cymbalta when prescribed 2. pt stats she feels better with the increase, she is also requesting something for allergies for crusty eyes and drainage. No fever please advise. Pt states she gets messages through my chart.

## 2020-08-01 ENCOUNTER — Other Ambulatory Visit: Payer: Self-pay | Admitting: Cardiology

## 2020-08-01 DIAGNOSIS — D631 Anemia in chronic kidney disease: Secondary | ICD-10-CM | POA: Diagnosis not present

## 2020-08-01 DIAGNOSIS — Z20822 Contact with and (suspected) exposure to covid-19: Secondary | ICD-10-CM | POA: Diagnosis not present

## 2020-08-01 DIAGNOSIS — Z1152 Encounter for screening for COVID-19: Secondary | ICD-10-CM | POA: Diagnosis not present

## 2020-08-01 DIAGNOSIS — I4891 Unspecified atrial fibrillation: Secondary | ICD-10-CM | POA: Diagnosis not present

## 2020-08-01 DIAGNOSIS — N186 End stage renal disease: Secondary | ICD-10-CM | POA: Diagnosis not present

## 2020-08-01 DIAGNOSIS — E1129 Type 2 diabetes mellitus with other diabetic kidney complication: Secondary | ICD-10-CM | POA: Diagnosis not present

## 2020-08-01 DIAGNOSIS — I129 Hypertensive chronic kidney disease with stage 1 through stage 4 chronic kidney disease, or unspecified chronic kidney disease: Secondary | ICD-10-CM

## 2020-08-01 DIAGNOSIS — N2581 Secondary hyperparathyroidism of renal origin: Secondary | ICD-10-CM | POA: Diagnosis not present

## 2020-08-01 DIAGNOSIS — D509 Iron deficiency anemia, unspecified: Secondary | ICD-10-CM | POA: Diagnosis not present

## 2020-08-01 DIAGNOSIS — Z992 Dependence on renal dialysis: Secondary | ICD-10-CM | POA: Diagnosis not present

## 2020-08-03 DIAGNOSIS — N2581 Secondary hyperparathyroidism of renal origin: Secondary | ICD-10-CM | POA: Diagnosis not present

## 2020-08-03 DIAGNOSIS — D631 Anemia in chronic kidney disease: Secondary | ICD-10-CM | POA: Diagnosis not present

## 2020-08-03 DIAGNOSIS — D509 Iron deficiency anemia, unspecified: Secondary | ICD-10-CM | POA: Diagnosis not present

## 2020-08-03 DIAGNOSIS — N186 End stage renal disease: Secondary | ICD-10-CM | POA: Diagnosis not present

## 2020-08-03 DIAGNOSIS — Z992 Dependence on renal dialysis: Secondary | ICD-10-CM | POA: Diagnosis not present

## 2020-08-08 DIAGNOSIS — Z992 Dependence on renal dialysis: Secondary | ICD-10-CM | POA: Diagnosis not present

## 2020-08-08 DIAGNOSIS — N2581 Secondary hyperparathyroidism of renal origin: Secondary | ICD-10-CM | POA: Diagnosis not present

## 2020-08-08 DIAGNOSIS — N186 End stage renal disease: Secondary | ICD-10-CM | POA: Diagnosis not present

## 2020-08-08 DIAGNOSIS — D509 Iron deficiency anemia, unspecified: Secondary | ICD-10-CM | POA: Diagnosis not present

## 2020-08-08 DIAGNOSIS — D631 Anemia in chronic kidney disease: Secondary | ICD-10-CM | POA: Diagnosis not present

## 2020-08-10 DIAGNOSIS — Z992 Dependence on renal dialysis: Secondary | ICD-10-CM | POA: Diagnosis not present

## 2020-08-10 DIAGNOSIS — D509 Iron deficiency anemia, unspecified: Secondary | ICD-10-CM | POA: Diagnosis not present

## 2020-08-10 DIAGNOSIS — D631 Anemia in chronic kidney disease: Secondary | ICD-10-CM | POA: Diagnosis not present

## 2020-08-10 DIAGNOSIS — N186 End stage renal disease: Secondary | ICD-10-CM | POA: Diagnosis not present

## 2020-08-10 DIAGNOSIS — N2581 Secondary hyperparathyroidism of renal origin: Secondary | ICD-10-CM | POA: Diagnosis not present

## 2020-08-13 DIAGNOSIS — N2581 Secondary hyperparathyroidism of renal origin: Secondary | ICD-10-CM | POA: Diagnosis not present

## 2020-08-13 DIAGNOSIS — D509 Iron deficiency anemia, unspecified: Secondary | ICD-10-CM | POA: Diagnosis not present

## 2020-08-13 DIAGNOSIS — D631 Anemia in chronic kidney disease: Secondary | ICD-10-CM | POA: Diagnosis not present

## 2020-08-13 DIAGNOSIS — Z992 Dependence on renal dialysis: Secondary | ICD-10-CM | POA: Diagnosis not present

## 2020-08-13 DIAGNOSIS — N186 End stage renal disease: Secondary | ICD-10-CM | POA: Diagnosis not present

## 2020-08-14 ENCOUNTER — Other Ambulatory Visit: Payer: Self-pay

## 2020-08-14 ENCOUNTER — Ambulatory Visit: Payer: Medicare Other | Admitting: Pharmacist

## 2020-08-14 DIAGNOSIS — Z5181 Encounter for therapeutic drug level monitoring: Secondary | ICD-10-CM

## 2020-08-14 DIAGNOSIS — I480 Paroxysmal atrial fibrillation: Secondary | ICD-10-CM | POA: Diagnosis not present

## 2020-08-14 DIAGNOSIS — Z7901 Long term (current) use of anticoagulants: Secondary | ICD-10-CM | POA: Diagnosis not present

## 2020-08-14 LAB — POCT INR: INR: 3.3 — AB (ref 2.0–3.0)

## 2020-08-14 NOTE — Patient Instructions (Signed)
INR above goal. Decrease weekly dose to 7.5 mg every Mon, Fri and 5 mg all other days. Recheck in 2 weeks.

## 2020-08-14 NOTE — Progress Notes (Signed)
Anticoagulation Management Jane [name] is a 75 y.o. female who reports to the clinic for monitoring of warfarin treatment.    Indication: atrial fibrillation CHA2DS2 Vasc Score 4 (Age >35, female, HTN hx), HAS-BLED 2 (Age>65, renal disease)  Duration: indefinite Supervising physician: Adrian Prows  Anticoagulation Clinic Visit History:  Patient does not report signs/symptoms of bleeding or thromboembolism.  Other recent changes: No change in diet, medications, lifestyle. Pt reports to have improved appetite since last check.   Continues to have back and neck pain. Managed with duloxetine, tylenol, and Norco   Anticoagulation Episode Summary    Current INR goal:  2.0-3.0  TTR:  72.1 % (1.9 y)  Next INR check:  08/28/2020  INR from last check:  3.3 (08/14/2020)  Weekly max warfarin dose:    Target end date:  Indefinite  INR check location:    Preferred lab:    Send INR reminders to:     Indications   Paroxysmal atrial fibrillation (HCC) [I48.0] Monitoring for long-term anticoagulant use [Z51.81 Z79.01]       Comments:          Allergies  Allergen Reactions  . Latex Rash  . Penicillins Other (See Comments)    Yeast infection / Childhood  . Sulfa Antibiotics Rash  . Tape Other (See Comments)    Plastic, silicone, and paper tape causes bruising and pulls off skin. Cloth tape works fine    Current Outpatient Medications:  .  acetaminophen (TYLENOL) 500 MG tablet, Take 1,500 mg by mouth daily as needed for headache., Disp: , Rfl:  .  albuterol (PROVENTIL HFA;VENTOLIN HFA) 108 (90 Base) MCG/ACT inhaler, Inhale 1 puff into the lungs every 6 (six) hours as needed for wheezing or shortness of breath. (Patient taking differently: Inhale 2 puffs into the lungs every 6 (six) hours as needed for wheezing or shortness of breath.), Disp: 6.7 g, Rfl: 2 .  amLODipine (NORVASC) 5 MG tablet, Take 10 mg by mouth daily., Disp: , Rfl:  .  B Complex-C-Zn-Folic Acid (DIALYVITE 329 WITH ZINC) 0.8 MG  TABS, Take 1 tablet by mouth daily., Disp: , Rfl:  .  carvedilol (COREG) 12.5 MG tablet, TAKE 1 TABLET BY MOUTH TWICE DAILY WITH A MEAL, Disp: 60 tablet, Rfl: 0 .  doxycycline (VIBRAMYCIN) 100 MG capsule, Take 100 mg by mouth 2 (two) times daily., Disp: , Rfl:  .  DULoxetine (CYMBALTA) 20 MG capsule, Take 2 capsules (40 mg total) by mouth daily., Disp: 60 capsule, Rfl: 2 .  ethyl chloride spray, Apply 1 application topically daily as needed (port access). , Disp: , Rfl: 12 .  folic acid-vitamin b complex-vitamin c-selenium-zinc (DIALYVITE) 3 MG TABS tablet, Take 1 tablet by mouth daily., Disp: , Rfl:  .  hydrALAZINE (APRESOLINE) 25 MG tablet, TAKE 1 TABLET BY MOUTH THREE TIMES DAILY, Disp: 90 tablet, Rfl: 0 .  HYDROcodone-acetaminophen (NORCO/VICODIN) 5-325 MG tablet, Take 1 tablet by mouth daily as needed for moderate pain., Disp: 30 tablet, Rfl: 0 .  isosorbide dinitrate (ISORDIL) 30 MG tablet, Take 0.5 tablets (15 mg total) by mouth 2 (two) times daily. (Patient taking differently: Take 30 mg by mouth 3 (three) times daily.), Disp: 90 tablet, Rfl: 2 .  linaclotide (LINZESS) 145 MCG CAPS capsule, Take 1 capsule (145 mcg total) by mouth daily before breakfast., Disp: 30 capsule, Rfl: 3 .  Methoxy PEG-Epoetin Beta (MIRCERA IJ), Mircera, Disp: , Rfl:  .  omeprazole (PRILOSEC) 40 MG capsule, TAKE 1 CAPSULE BY MOUTH TWICE DAILY  BEFORE A MEAL, Disp: 60 capsule, Rfl: 2 .  ondansetron (ZOFRAN) 4 MG tablet, Take 1 tablet (4 mg total) by mouth every 12 (twelve) hours as needed for nausea or vomiting., Disp: 30 tablet, Rfl: 0 .  sevelamer carbonate (RENVELA) 800 MG tablet, Take 1,600 mg by mouth in the morning and at bedtime. Taking with meals, Disp: , Rfl:  .  warfarin (COUMADIN) 5 MG tablet, As directed by coumadin clinic. (Patient taking differently: Take 5-7.5 mg by mouth See admin instructions. Take  7.5 mg every Mon, Fri; 5 mg all other days. Refer to most recent anticoagulation clinic note for most  accurate dosing instructions.), Disp: 113 tablet, Rfl: 2 Past Medical History:  Diagnosis Date  . Acid reflux   . Anemia of chronic disease   . Arthritis   . Asthma   . Atrial fibrillation ([place])   . Bilateral carotid bruits   . Complication of anesthesia    "hard to wake up, I have sleep apnea" no CPAP  . Depression   . Diverticulitis   . Duodenal ulcer   . Dysrhythmia    Afib  . ESRD (end stage renal disease) ([place])    MWF [place]  . Headache   . History of blood transfusion   . Hypertension   . Malaise and fatigue   . Orthostatic hypotension   . Shortness of breath    " when I walk to fast"  . Sleep apnea   . Syncope   . Tubulovillous adenoma of colon    ASSESSMENT  Recent Results: The most recent result is correlated with 42.5 mg per week:  Lab Results  Component Value Date   INR 3.3 (A) 08/14/2020   INR 2.9 07/03/2020   INR 3.5 (A) 06/19/2020    Anticoagulation Dosing: Description   INR above goal. Decrease weekly dose to 7.5 mg every Mon, Fri and 5 mg all other days. Recheck in 2 weeks.      INR today: Supratherapeutic. Labile appetite affecting pt's regular Vit K intake. Denies any complains of bleeding or bruising symptoms. Denies any other relevant changes in her diet, medications, or lifestyle. In setting of unexplained supratheraputic reading, will decrease weekly dose and continue close monitoring to ensure INR returns to being therapeutic.   PLAN Weekly dose was decreased by 5.9% to 40 mg/week. Decrease weekly dose to 7.5 mg every Mon, Fri, 7.5 mg all other days. Recheck INR in 2 weeks.     Patient Instructions  INR above goal. Decrease weekly dose to 7.5 mg every Mon, Fri and 5 mg all other days. Recheck in 2 weeks.  Patient advised to contact clinic or seek medical attention if signs/symptoms of bleeding or thromboembolism occur.  Patient verbalized understanding by repeating back information and was advised to contact me if further  medication-related questions arise.   Follow-up Return in about 2 weeks (around 08/28/2020).  [name], PharmD  15 minutes spent face-to-face with the patient during the encounter. 50% of time spent on education, including signs/sx bleeding and clotting, as well as food and drug interactions with warfarin. 50% of time was spent on fingerprick POC INR sample collection,processing, results determination, and documentation

## 2020-08-15 ENCOUNTER — Encounter: Payer: Self-pay | Admitting: Family Medicine

## 2020-08-15 DIAGNOSIS — N186 End stage renal disease: Secondary | ICD-10-CM | POA: Diagnosis not present

## 2020-08-15 DIAGNOSIS — N2581 Secondary hyperparathyroidism of renal origin: Secondary | ICD-10-CM | POA: Diagnosis not present

## 2020-08-15 DIAGNOSIS — D509 Iron deficiency anemia, unspecified: Secondary | ICD-10-CM | POA: Diagnosis not present

## 2020-08-15 DIAGNOSIS — D631 Anemia in chronic kidney disease: Secondary | ICD-10-CM | POA: Diagnosis not present

## 2020-08-15 DIAGNOSIS — Z992 Dependence on renal dialysis: Secondary | ICD-10-CM | POA: Diagnosis not present

## 2020-08-17 DIAGNOSIS — Z992 Dependence on renal dialysis: Secondary | ICD-10-CM | POA: Diagnosis not present

## 2020-08-17 DIAGNOSIS — D631 Anemia in chronic kidney disease: Secondary | ICD-10-CM | POA: Diagnosis not present

## 2020-08-17 DIAGNOSIS — D509 Iron deficiency anemia, unspecified: Secondary | ICD-10-CM | POA: Diagnosis not present

## 2020-08-17 DIAGNOSIS — N186 End stage renal disease: Secondary | ICD-10-CM | POA: Diagnosis not present

## 2020-08-17 DIAGNOSIS — N2581 Secondary hyperparathyroidism of renal origin: Secondary | ICD-10-CM | POA: Diagnosis not present

## 2020-08-20 DIAGNOSIS — I10 Essential (primary) hypertension: Secondary | ICD-10-CM | POA: Diagnosis not present

## 2020-08-20 DIAGNOSIS — Z992 Dependence on renal dialysis: Secondary | ICD-10-CM | POA: Diagnosis not present

## 2020-08-20 DIAGNOSIS — N186 End stage renal disease: Secondary | ICD-10-CM | POA: Diagnosis not present

## 2020-08-20 DIAGNOSIS — D631 Anemia in chronic kidney disease: Secondary | ICD-10-CM | POA: Diagnosis not present

## 2020-08-20 DIAGNOSIS — I480 Paroxysmal atrial fibrillation: Secondary | ICD-10-CM | POA: Diagnosis not present

## 2020-08-20 DIAGNOSIS — D509 Iron deficiency anemia, unspecified: Secondary | ICD-10-CM | POA: Diagnosis not present

## 2020-08-20 DIAGNOSIS — N2581 Secondary hyperparathyroidism of renal origin: Secondary | ICD-10-CM | POA: Diagnosis not present

## 2020-08-21 DIAGNOSIS — N186 End stage renal disease: Secondary | ICD-10-CM | POA: Diagnosis not present

## 2020-08-21 DIAGNOSIS — Z992 Dependence on renal dialysis: Secondary | ICD-10-CM | POA: Diagnosis not present

## 2020-08-21 DIAGNOSIS — I158 Other secondary hypertension: Secondary | ICD-10-CM | POA: Diagnosis not present

## 2020-08-22 ENCOUNTER — Other Ambulatory Visit: Payer: Self-pay | Admitting: Family Medicine

## 2020-08-22 DIAGNOSIS — N186 End stage renal disease: Secondary | ICD-10-CM | POA: Diagnosis not present

## 2020-08-22 DIAGNOSIS — E039 Hypothyroidism, unspecified: Secondary | ICD-10-CM | POA: Diagnosis not present

## 2020-08-22 DIAGNOSIS — D509 Iron deficiency anemia, unspecified: Secondary | ICD-10-CM | POA: Diagnosis not present

## 2020-08-22 DIAGNOSIS — M159 Polyosteoarthritis, unspecified: Secondary | ICD-10-CM

## 2020-08-22 DIAGNOSIS — Z992 Dependence on renal dialysis: Secondary | ICD-10-CM | POA: Diagnosis not present

## 2020-08-22 DIAGNOSIS — D631 Anemia in chronic kidney disease: Secondary | ICD-10-CM | POA: Diagnosis not present

## 2020-08-22 DIAGNOSIS — N2581 Secondary hyperparathyroidism of renal origin: Secondary | ICD-10-CM | POA: Diagnosis not present

## 2020-08-22 DIAGNOSIS — G894 Chronic pain syndrome: Secondary | ICD-10-CM

## 2020-08-22 MED ORDER — HYDROCODONE-ACETAMINOPHEN 5-325 MG PO TABS
1.0000 | ORAL_TABLET | Freq: Every day | ORAL | 0 refills | Status: DC | PRN
Start: 2020-08-22 — End: 2020-10-23

## 2020-08-22 NOTE — Telephone Encounter (Signed)
Pt is calling in needing a refill on Rx hydrocodone-acetaminophen (NORCO/VICODIN) 5-325 MG   Pharm: [facility] on [address].

## 2020-08-22 NOTE — Telephone Encounter (Signed)
Rx last filled 07/19/20  Last OV: 07/10/20  Contract: on file - but needs to be updated next visit to reflect new sig/quantity.  Next OV: 10/19/20

## 2020-08-24 DIAGNOSIS — E039 Hypothyroidism, unspecified: Secondary | ICD-10-CM | POA: Diagnosis not present

## 2020-08-24 DIAGNOSIS — N186 End stage renal disease: Secondary | ICD-10-CM | POA: Diagnosis not present

## 2020-08-24 DIAGNOSIS — N2581 Secondary hyperparathyroidism of renal origin: Secondary | ICD-10-CM | POA: Diagnosis not present

## 2020-08-24 DIAGNOSIS — D631 Anemia in chronic kidney disease: Secondary | ICD-10-CM | POA: Diagnosis not present

## 2020-08-24 DIAGNOSIS — D509 Iron deficiency anemia, unspecified: Secondary | ICD-10-CM | POA: Diagnosis not present

## 2020-08-24 DIAGNOSIS — Z992 Dependence on renal dialysis: Secondary | ICD-10-CM | POA: Diagnosis not present

## 2020-08-27 DIAGNOSIS — E039 Hypothyroidism, unspecified: Secondary | ICD-10-CM | POA: Diagnosis not present

## 2020-08-27 DIAGNOSIS — N186 End stage renal disease: Secondary | ICD-10-CM | POA: Diagnosis not present

## 2020-08-27 DIAGNOSIS — D509 Iron deficiency anemia, unspecified: Secondary | ICD-10-CM | POA: Diagnosis not present

## 2020-08-27 DIAGNOSIS — D631 Anemia in chronic kidney disease: Secondary | ICD-10-CM | POA: Diagnosis not present

## 2020-08-27 DIAGNOSIS — Z992 Dependence on renal dialysis: Secondary | ICD-10-CM | POA: Diagnosis not present

## 2020-08-27 DIAGNOSIS — N2581 Secondary hyperparathyroidism of renal origin: Secondary | ICD-10-CM | POA: Diagnosis not present

## 2020-08-28 ENCOUNTER — Other Ambulatory Visit: Payer: Self-pay

## 2020-08-28 ENCOUNTER — Ambulatory Visit: Payer: Medicare Other | Admitting: Pharmacist

## 2020-08-28 DIAGNOSIS — Z5181 Encounter for therapeutic drug level monitoring: Secondary | ICD-10-CM

## 2020-08-28 DIAGNOSIS — Z7901 Long term (current) use of anticoagulants: Secondary | ICD-10-CM | POA: Diagnosis not present

## 2020-08-28 DIAGNOSIS — I480 Paroxysmal atrial fibrillation: Secondary | ICD-10-CM | POA: Diagnosis not present

## 2020-08-28 LAB — POCT INR: INR: 3.4 — AB (ref 2.0–3.0)

## 2020-08-28 NOTE — Progress Notes (Signed)
Anticoagulation Management Jane [name] is a 75 y.o. female who reports to the clinic for monitoring of warfarin treatment.    Indication: atrial fibrillation CHA2DS2 Vasc Score 4 (Age >58, female, HTN hx), HAS-BLED 2 (Age>65, renal disease)  Duration: indefinite Supervising physician: Adrian Prows  Anticoagulation Clinic Visit History:  Patient does not report signs/symptoms of bleeding or thromboembolism.  Other recent changes: No change in diet, medications, lifestyle. Pt continues to have persistent chronic pain in her back, knee, and neck. Significantly affecting patient's QOLs and ADLs. Pt reports that she had limited appetite. Pt planing on following up with pain management to address her pain management needs. Currently managed with duloxetine, tylenol, and Norco.   Anticoagulation Episode Summary    Current INR goal:  2.0-3.0  TTR:  70.6 % (1.9 y)  Next INR check:  09/11/2020  INR from last check:  3.4 (08/28/2020)  Weekly max warfarin dose:    Target end date:  Indefinite  INR check location:    Preferred lab:    Send INR reminders to:     Indications   Paroxysmal atrial fibrillation (HCC) [I48.0] Monitoring for long-term anticoagulant use [Z51.81 Z79.01]       Comments:          Allergies  Allergen Reactions  . Latex Rash  . Penicillins Other (See Comments)    Yeast infection / Childhood  . Sulfa Antibiotics Rash  . Tape Other (See Comments)    Plastic, silicone, and paper tape causes bruising and pulls off skin. Cloth tape works fine    Current Outpatient Medications:  .  acetaminophen (TYLENOL) 500 MG tablet, Take 1,500 mg by mouth daily as needed for headache., Disp: , Rfl:  .  albuterol (PROVENTIL HFA;VENTOLIN HFA) 108 (90 Base) MCG/ACT inhaler, Inhale 1 puff into the lungs every 6 (six) hours as needed for wheezing or shortness of breath. (Patient taking differently: Inhale 2 puffs into the lungs every 6 (six) hours as needed for wheezing or shortness of  breath.), Disp: 6.7 g, Rfl: 2 .  amLODipine (NORVASC) 5 MG tablet, Take 10 mg by mouth daily., Disp: , Rfl:  .  B Complex-C-Zn-Folic Acid (DIALYVITE 338 WITH ZINC) 0.8 MG TABS, Take 1 tablet by mouth daily., Disp: , Rfl:  .  carvedilol (COREG) 12.5 MG tablet, TAKE 1 TABLET BY MOUTH TWICE DAILY WITH A MEAL, Disp: 60 tablet, Rfl: 0 .  doxycycline (VIBRAMYCIN) 100 MG capsule, Take 100 mg by mouth 2 (two) times daily., Disp: , Rfl:  .  DULoxetine (CYMBALTA) 20 MG capsule, Take 2 capsules (40 mg total) by mouth daily., Disp: 60 capsule, Rfl: 2 .  ethyl chloride spray, Apply 1 application topically daily as needed (port access). , Disp: , Rfl: 12 .  folic acid-vitamin b complex-vitamin c-selenium-zinc (DIALYVITE) 3 MG TABS tablet, Take 1 tablet by mouth daily., Disp: , Rfl:  .  hydrALAZINE (APRESOLINE) 25 MG tablet, TAKE 1 TABLET BY MOUTH THREE TIMES DAILY (Patient taking differently: Take 50 mg by mouth 3 (three) times daily. Take 1 extra if SBP>150), Disp: 90 tablet, Rfl: 0 .  HYDROcodone-acetaminophen (NORCO/VICODIN) 5-325 MG tablet, Take 1 tablet by mouth daily as needed for moderate pain., Disp: 30 tablet, Rfl: 0 .  isosorbide dinitrate (ISORDIL) 30 MG tablet, Take 0.5 tablets (15 mg total) by mouth 2 (two) times daily. (Patient taking differently: Take 30 mg by mouth 3 (three) times daily.), Disp: 90 tablet, Rfl: 2 .  linaclotide (LINZESS) 145 MCG CAPS capsule, Take 1  capsule (145 mcg total) by mouth daily before breakfast., Disp: 30 capsule, Rfl: 3 .  Methoxy PEG-Epoetin Beta (MIRCERA IJ), Mircera, Disp: , Rfl:  .  omeprazole (PRILOSEC) 40 MG capsule, TAKE 1 CAPSULE BY MOUTH TWICE DAILY BEFORE A MEAL, Disp: 60 capsule, Rfl: 2 .  ondansetron (ZOFRAN) 4 MG tablet, Take 1 tablet (4 mg total) by mouth every 12 (twelve) hours as needed for nausea or vomiting., Disp: 30 tablet, Rfl: 0 .  sevelamer carbonate (RENVELA) 800 MG tablet, Take 1,600 mg by mouth in the morning and at bedtime. Taking with meals,  Disp: , Rfl:  .  warfarin (COUMADIN) 5 MG tablet, As directed by coumadin clinic. (Patient taking differently: Take 5-7.5 mg by mouth See admin instructions. Take  7.5 mg every Mon, Fri; 5 mg all other days. Refer to most recent anticoagulation clinic note for most accurate dosing instructions.), Disp: 113 tablet, Rfl: 2 Past Medical History:  Diagnosis Date  . Acid reflux   . Anemia of chronic disease   . Arthritis   . Asthma   . Atrial fibrillation ([place])   . Bilateral carotid bruits   . Complication of anesthesia    "hard to wake up, I have sleep apnea" no CPAP  . Depression   . Diverticulitis   . Duodenal ulcer   . Dysrhythmia    Afib  . ESRD (end stage renal disease) ([place])    MWF [place]  . Headache   . History of blood transfusion   . Hypertension   . Malaise and fatigue   . Orthostatic hypotension   . Shortness of breath    " when I walk to fast"  . Sleep apnea   . Syncope   . Tubulovillous adenoma of colon    ASSESSMENT  Recent Results: The most recent result is correlated with 37.5 mg per week:  Lab Results  Component Value Date   INR 3.4 (A) 08/28/2020   INR 3.3 (A) 08/14/2020   INR 2.9 07/03/2020    Anticoagulation Dosing: Description   INR above goal. Take 2.5 mg today and then decrease weekly dose to 7.5 mg every Mon and 5 mg all other days. Recheck in 2 weeks.      INR today: Supratherapeutic. Likely in the setting of continued labile appetite. Warfarin weekly dose decreased at last visit. Pt reports that she hasnt had any green intake and has been affected significantly her ADLs.. Denies any complains of bleeding or bruising symptoms. Denies any other relevant changes in her diet, medications, or lifestyle. In setting of persistent supratheraputic, will continue with weekly dose decrease and continued close monitoring.    PLAN Weekly dose was decreased by 6.2% to 37.5 mg/week. Take 2.5 mg today and then decrease weekly dose to 7.5 mg every Mon,  and 7.5 mg all other days. Recheck INR in 2 weeks.     Patient Instructions  INR above goal. Take 2.5 mg today and then decrease weekly dose to 7.5 mg every Mon and 5 mg all other days. Recheck in 2 weeks.  Patient advised to contact clinic or seek medical attention if signs/symptoms of bleeding or thromboembolism occur.  Patient verbalized understanding by repeating back information and was advised to contact me if further medication-related questions arise.   Follow-up Return in about 2 weeks (around 09/11/2020).  [name], PharmD  15 minutes spent face-to-face with the patient during the encounter. 50% of time spent on education, including signs/sx bleeding and clotting, as well  as food and drug interactions with warfarin. 50% of time was spent on fingerprick POC INR sample collection,processing, results determination, and documentation

## 2020-08-28 NOTE — Patient Instructions (Signed)
INR above goal. Take 2.5 mg today and then decrease weekly dose to 7.5 mg every Mon and 5 mg all other days. Recheck in 2 weeks.

## 2020-08-29 ENCOUNTER — Other Ambulatory Visit: Payer: Self-pay | Admitting: Family Medicine

## 2020-08-29 ENCOUNTER — Other Ambulatory Visit: Payer: Self-pay | Admitting: Cardiology

## 2020-08-29 ENCOUNTER — Other Ambulatory Visit: Payer: Self-pay | Admitting: Gastroenterology

## 2020-08-29 DIAGNOSIS — D631 Anemia in chronic kidney disease: Secondary | ICD-10-CM | POA: Diagnosis not present

## 2020-08-29 DIAGNOSIS — N186 End stage renal disease: Secondary | ICD-10-CM | POA: Diagnosis not present

## 2020-08-29 DIAGNOSIS — Z992 Dependence on renal dialysis: Secondary | ICD-10-CM | POA: Diagnosis not present

## 2020-08-29 DIAGNOSIS — I129 Hypertensive chronic kidney disease with stage 1 through stage 4 chronic kidney disease, or unspecified chronic kidney disease: Secondary | ICD-10-CM

## 2020-08-29 DIAGNOSIS — I4891 Unspecified atrial fibrillation: Secondary | ICD-10-CM | POA: Diagnosis not present

## 2020-08-29 DIAGNOSIS — E039 Hypothyroidism, unspecified: Secondary | ICD-10-CM | POA: Diagnosis not present

## 2020-08-29 DIAGNOSIS — M159 Polyosteoarthritis, unspecified: Secondary | ICD-10-CM

## 2020-08-29 DIAGNOSIS — N2581 Secondary hyperparathyroidism of renal origin: Secondary | ICD-10-CM | POA: Diagnosis not present

## 2020-08-29 DIAGNOSIS — D509 Iron deficiency anemia, unspecified: Secondary | ICD-10-CM | POA: Diagnosis not present

## 2020-08-31 DIAGNOSIS — E039 Hypothyroidism, unspecified: Secondary | ICD-10-CM | POA: Diagnosis not present

## 2020-08-31 DIAGNOSIS — D631 Anemia in chronic kidney disease: Secondary | ICD-10-CM | POA: Diagnosis not present

## 2020-08-31 DIAGNOSIS — Z992 Dependence on renal dialysis: Secondary | ICD-10-CM | POA: Diagnosis not present

## 2020-08-31 DIAGNOSIS — D509 Iron deficiency anemia, unspecified: Secondary | ICD-10-CM | POA: Diagnosis not present

## 2020-08-31 DIAGNOSIS — N186 End stage renal disease: Secondary | ICD-10-CM | POA: Diagnosis not present

## 2020-08-31 DIAGNOSIS — N2581 Secondary hyperparathyroidism of renal origin: Secondary | ICD-10-CM | POA: Diagnosis not present

## 2020-09-03 DIAGNOSIS — Z992 Dependence on renal dialysis: Secondary | ICD-10-CM | POA: Diagnosis not present

## 2020-09-03 DIAGNOSIS — N2581 Secondary hyperparathyroidism of renal origin: Secondary | ICD-10-CM | POA: Diagnosis not present

## 2020-09-03 DIAGNOSIS — E039 Hypothyroidism, unspecified: Secondary | ICD-10-CM | POA: Diagnosis not present

## 2020-09-03 DIAGNOSIS — N186 End stage renal disease: Secondary | ICD-10-CM | POA: Diagnosis not present

## 2020-09-03 DIAGNOSIS — D509 Iron deficiency anemia, unspecified: Secondary | ICD-10-CM | POA: Diagnosis not present

## 2020-09-03 DIAGNOSIS — D631 Anemia in chronic kidney disease: Secondary | ICD-10-CM | POA: Diagnosis not present

## 2020-09-05 DIAGNOSIS — N2581 Secondary hyperparathyroidism of renal origin: Secondary | ICD-10-CM | POA: Diagnosis not present

## 2020-09-05 DIAGNOSIS — D509 Iron deficiency anemia, unspecified: Secondary | ICD-10-CM | POA: Diagnosis not present

## 2020-09-05 DIAGNOSIS — D631 Anemia in chronic kidney disease: Secondary | ICD-10-CM | POA: Diagnosis not present

## 2020-09-05 DIAGNOSIS — Z992 Dependence on renal dialysis: Secondary | ICD-10-CM | POA: Diagnosis not present

## 2020-09-05 DIAGNOSIS — E039 Hypothyroidism, unspecified: Secondary | ICD-10-CM | POA: Diagnosis not present

## 2020-09-05 DIAGNOSIS — N186 End stage renal disease: Secondary | ICD-10-CM | POA: Diagnosis not present

## 2020-09-07 DIAGNOSIS — D509 Iron deficiency anemia, unspecified: Secondary | ICD-10-CM | POA: Diagnosis not present

## 2020-09-07 DIAGNOSIS — E039 Hypothyroidism, unspecified: Secondary | ICD-10-CM | POA: Diagnosis not present

## 2020-09-07 DIAGNOSIS — Z992 Dependence on renal dialysis: Secondary | ICD-10-CM | POA: Diagnosis not present

## 2020-09-07 DIAGNOSIS — N2581 Secondary hyperparathyroidism of renal origin: Secondary | ICD-10-CM | POA: Diagnosis not present

## 2020-09-07 DIAGNOSIS — D631 Anemia in chronic kidney disease: Secondary | ICD-10-CM | POA: Diagnosis not present

## 2020-09-07 DIAGNOSIS — N186 End stage renal disease: Secondary | ICD-10-CM | POA: Diagnosis not present

## 2020-09-10 DIAGNOSIS — Z992 Dependence on renal dialysis: Secondary | ICD-10-CM | POA: Diagnosis not present

## 2020-09-10 DIAGNOSIS — D509 Iron deficiency anemia, unspecified: Secondary | ICD-10-CM | POA: Diagnosis not present

## 2020-09-10 DIAGNOSIS — N186 End stage renal disease: Secondary | ICD-10-CM | POA: Diagnosis not present

## 2020-09-10 DIAGNOSIS — N2581 Secondary hyperparathyroidism of renal origin: Secondary | ICD-10-CM | POA: Diagnosis not present

## 2020-09-10 DIAGNOSIS — E039 Hypothyroidism, unspecified: Secondary | ICD-10-CM | POA: Diagnosis not present

## 2020-09-10 DIAGNOSIS — D631 Anemia in chronic kidney disease: Secondary | ICD-10-CM | POA: Diagnosis not present

## 2020-09-12 DIAGNOSIS — Z992 Dependence on renal dialysis: Secondary | ICD-10-CM | POA: Diagnosis not present

## 2020-09-12 DIAGNOSIS — D631 Anemia in chronic kidney disease: Secondary | ICD-10-CM | POA: Diagnosis not present

## 2020-09-12 DIAGNOSIS — E039 Hypothyroidism, unspecified: Secondary | ICD-10-CM | POA: Diagnosis not present

## 2020-09-12 DIAGNOSIS — N186 End stage renal disease: Secondary | ICD-10-CM | POA: Diagnosis not present

## 2020-09-12 DIAGNOSIS — D509 Iron deficiency anemia, unspecified: Secondary | ICD-10-CM | POA: Diagnosis not present

## 2020-09-12 DIAGNOSIS — N2581 Secondary hyperparathyroidism of renal origin: Secondary | ICD-10-CM | POA: Diagnosis not present

## 2020-09-14 DIAGNOSIS — Z992 Dependence on renal dialysis: Secondary | ICD-10-CM | POA: Diagnosis not present

## 2020-09-14 DIAGNOSIS — E039 Hypothyroidism, unspecified: Secondary | ICD-10-CM | POA: Diagnosis not present

## 2020-09-14 DIAGNOSIS — N2581 Secondary hyperparathyroidism of renal origin: Secondary | ICD-10-CM | POA: Diagnosis not present

## 2020-09-14 DIAGNOSIS — N186 End stage renal disease: Secondary | ICD-10-CM | POA: Diagnosis not present

## 2020-09-14 DIAGNOSIS — D631 Anemia in chronic kidney disease: Secondary | ICD-10-CM | POA: Diagnosis not present

## 2020-09-14 DIAGNOSIS — D509 Iron deficiency anemia, unspecified: Secondary | ICD-10-CM | POA: Diagnosis not present

## 2020-09-17 DIAGNOSIS — D631 Anemia in chronic kidney disease: Secondary | ICD-10-CM | POA: Diagnosis not present

## 2020-09-17 DIAGNOSIS — Z992 Dependence on renal dialysis: Secondary | ICD-10-CM | POA: Diagnosis not present

## 2020-09-17 DIAGNOSIS — E039 Hypothyroidism, unspecified: Secondary | ICD-10-CM | POA: Diagnosis not present

## 2020-09-17 DIAGNOSIS — D509 Iron deficiency anemia, unspecified: Secondary | ICD-10-CM | POA: Diagnosis not present

## 2020-09-17 DIAGNOSIS — N2581 Secondary hyperparathyroidism of renal origin: Secondary | ICD-10-CM | POA: Diagnosis not present

## 2020-09-17 DIAGNOSIS — N186 End stage renal disease: Secondary | ICD-10-CM | POA: Diagnosis not present

## 2020-09-18 ENCOUNTER — Ambulatory Visit: Payer: Medicare Other | Admitting: Pharmacist

## 2020-09-18 ENCOUNTER — Other Ambulatory Visit: Payer: Self-pay

## 2020-09-18 DIAGNOSIS — Z992 Dependence on renal dialysis: Secondary | ICD-10-CM | POA: Diagnosis not present

## 2020-09-18 DIAGNOSIS — I158 Other secondary hypertension: Secondary | ICD-10-CM | POA: Diagnosis not present

## 2020-09-18 DIAGNOSIS — I480 Paroxysmal atrial fibrillation: Secondary | ICD-10-CM | POA: Diagnosis not present

## 2020-09-18 DIAGNOSIS — N186 End stage renal disease: Secondary | ICD-10-CM | POA: Diagnosis not present

## 2020-09-18 DIAGNOSIS — Z5181 Encounter for therapeutic drug level monitoring: Secondary | ICD-10-CM | POA: Diagnosis not present

## 2020-09-18 DIAGNOSIS — Z7901 Long term (current) use of anticoagulants: Secondary | ICD-10-CM | POA: Diagnosis not present

## 2020-09-18 LAB — POCT INR: INR: 4 — AB (ref 2.0–3.0)

## 2020-09-18 NOTE — Progress Notes (Signed)
Anticoagulation Management Jane [name] is a 75 y.o. female who reports to the clinic for monitoring of warfarin treatment.    Indication: atrial fibrillation CHA2DS2 Vasc Score 4 (Age >43, female, HTN hx), HAS-BLED 2 (Age>65, renal disease)  Duration: indefinite Supervising physician: Adrian Prows  Anticoagulation Clinic Visit History:  Patient does not report signs/symptoms of bleeding or thromboembolism.  Other recent changes: No change in diet, medications, lifestyle. Pt continues to have persistent chronic pain in her back, knee, and neck. Significantly affecting patient's QOLs and ADLs.   Pt reports that appetite has improved slightly. Still having severe pain symptoms. Denies any intake of alcohol, extra warfarin doses, cranberry, grapefruit product intake.   Anticoagulation Episode Summary    Current INR goal:  2.0-3.0  TTR:  68.6 % (2 y)  Next INR check:  09/27/2020  INR from last check:  4.0 (09/18/2020)  Weekly max warfarin dose:    Target end date:  Indefinite  INR check location:    Preferred lab:    Send INR reminders to:     Indications   Paroxysmal atrial fibrillation (HCC) [I48.0] Monitoring for long-term anticoagulant use [Z51.81 Z79.01]       Comments:          Allergies  Allergen Reactions  . Latex Rash  . Penicillins Other (See Comments)    Yeast infection / Childhood  . Sulfa Antibiotics Rash  . Tape Other (See Comments)    Plastic, silicone, and paper tape causes bruising and pulls off skin. Cloth tape works fine    Current Outpatient Medications:  .  acetaminophen (TYLENOL) 500 MG tablet, Take 1,500 mg by mouth daily as needed for headache., Disp: , Rfl:  .  amLODipine (NORVASC) 5 MG tablet, Take 10 mg by mouth daily., Disp: , Rfl:  .  carvedilol (COREG) 12.5 MG tablet, TAKE 1 TABLET BY MOUTH TWICE DAILY WITH A MEAL (Patient taking differently: Take 25 mg by mouth 2 (two) times daily with a meal.), Disp: 60 tablet, Rfl: 0 .  hydrALAZINE (APRESOLINE)  25 MG tablet, TAKE 1 TABLET BY MOUTH THREE TIMES DAILY, Disp: 90 tablet, Rfl: 0 .  isosorbide dinitrate (ISORDIL) 30 MG tablet, Take 0.5 tablets (15 mg total) by mouth 2 (two) times daily. (Patient taking differently: Take 30 mg by mouth 3 (three) times daily.), Disp: 90 tablet, Rfl: 2 .  albuterol (PROVENTIL HFA;VENTOLIN HFA) 108 (90 Base) MCG/ACT inhaler, Inhale 1 puff into the lungs every 6 (six) hours as needed for wheezing or shortness of breath. (Patient taking differently: Inhale 2 puffs into the lungs every 6 (six) hours as needed for wheezing or shortness of breath.), Disp: 6.7 g, Rfl: 2 .  B Complex-C-Zn-Folic Acid (DIALYVITE 952 WITH ZINC) 0.8 MG TABS, Take 1 tablet by mouth daily., Disp: , Rfl:  .  doxycycline (VIBRAMYCIN) 100 MG capsule, Take 100 mg by mouth 2 (two) times daily., Disp: , Rfl:  .  DULoxetine (CYMBALTA) 20 MG capsule, Take 2 capsules by mouth once daily, Disp: 60 capsule, Rfl: 3 .  ethyl chloride spray, Apply 1 application topically daily as needed (port access). , Disp: , Rfl: 12 .  folic acid-vitamin b complex-vitamin c-selenium-zinc (DIALYVITE) 3 MG TABS tablet, Take 1 tablet by mouth daily., Disp: , Rfl:  .  HYDROcodone-acetaminophen (NORCO/VICODIN) 5-325 MG tablet, Take 1 tablet by mouth daily as needed for moderate pain., Disp: 30 tablet, Rfl: 0 .  linaclotide (LINZESS) 145 MCG CAPS capsule, Take 1 capsule (145 mcg total) by mouth  daily before breakfast., Disp: 30 capsule, Rfl: 3 .  Methoxy PEG-Epoetin Beta (MIRCERA IJ), Mircera, Disp: , Rfl:  .  omeprazole (PRILOSEC) 40 MG capsule, TAKE 1 CAPSULE BY MOUTH TWICE DAILY BEFORE A MEAL, Disp: 60 capsule, Rfl: 0 .  ondansetron (ZOFRAN) 4 MG tablet, Take 1 tablet (4 mg total) by mouth every 12 (twelve) hours as needed for nausea or vomiting., Disp: 30 tablet, Rfl: 0 .  sevelamer carbonate (RENVELA) 800 MG tablet, Take 1,600 mg by mouth in the morning and at bedtime. Taking with meals, Disp: , Rfl:  .  warfarin (COUMADIN) 5  MG tablet, As directed by coumadin clinic. (Patient taking differently: Take 5-7.5 mg by mouth See admin instructions. Take  7.5 mg every Mon, Fri; 5 mg all other days. Refer to most recent anticoagulation clinic note for most accurate dosing instructions.), Disp: 113 tablet, Rfl: 2 Past Medical History:  Diagnosis Date  . Acid reflux   . Anemia of chronic disease   . Arthritis   . Asthma   . Atrial fibrillation ([place])   . Bilateral carotid bruits   . Complication of anesthesia    "hard to wake up, I have sleep apnea" no CPAP  . Depression   . Diverticulitis   . Duodenal ulcer   . Dysrhythmia    Afib  . ESRD (end stage renal disease) ([place])    MWF [place]  . Headache   . History of blood transfusion   . Hypertension   . Malaise and fatigue   . Orthostatic hypotension   . Shortness of breath    " when I walk to fast"  . Sleep apnea   . Syncope   . Tubulovillous adenoma of colon    ASSESSMENT  Recent Results: The most recent result is correlated with 37.5 mg per week:  Lab Results  Component Value Date   INR 4.0 (A) 09/18/2020   INR 3.4 (A) 08/28/2020   INR 3.3 (A) 08/14/2020    Anticoagulation Dosing: Description   INR above goal. Hold today and then decrease dose to 2.5 mg every Wed and 5 mg to all other days. Recheck INR in 2 weeks.       INR today: Supratherapeutic.Continues to trend up despite weekly dose increase last visit. Denies any complains of bleeding or bruising symptoms. Denies any relevant changes in her diet, medications, or lifestyle that would explain the elevated INR reading. In setting of persistent supratheraputic, will continue with weekly dose decrease and continued close monitoring.    PLAN Weekly dose was decreased by 13.3% to 32.5 mg/week. Hold warfarin today and then decrease weekly dose to 2.5 mg every Wed and 5 mg all other days. Recheck INR in 1 week.     Patient Instructions  INR above goal. Hold today and then decrease dose to  2.5 mg every Wed and 5 mg to all other days. Recheck INR in 2 weeks.   Patient advised to contact clinic or seek medical attention if signs/symptoms of bleeding or thromboembolism occur.  Patient verbalized understanding by repeating back information and was advised to contact me if further medication-related questions arise.   Follow-up Return in about 10 days (around 09/28/2020).  [name], PharmD  15 minutes spent face-to-face with the patient during the encounter. 50% of time spent on education, including signs/sx bleeding and clotting, as well as food and drug interactions with warfarin. 50% of time was spent on fingerprick POC INR sample collection,processing, results determination, and documentation

## 2020-09-18 NOTE — Patient Instructions (Signed)
INR above goal. Hold today and then decrease dose to 2.5 mg every Wed and 5 mg to all other days. Recheck INR in 2 weeks.

## 2020-09-19 ENCOUNTER — Ambulatory Visit: Payer: Medicare Other | Admitting: Gastroenterology

## 2020-09-19 DIAGNOSIS — N186 End stage renal disease: Secondary | ICD-10-CM | POA: Diagnosis not present

## 2020-09-19 DIAGNOSIS — Z992 Dependence on renal dialysis: Secondary | ICD-10-CM | POA: Diagnosis not present

## 2020-09-19 DIAGNOSIS — D509 Iron deficiency anemia, unspecified: Secondary | ICD-10-CM | POA: Diagnosis not present

## 2020-09-19 DIAGNOSIS — N2581 Secondary hyperparathyroidism of renal origin: Secondary | ICD-10-CM | POA: Diagnosis not present

## 2020-09-19 DIAGNOSIS — D631 Anemia in chronic kidney disease: Secondary | ICD-10-CM | POA: Diagnosis not present

## 2020-09-20 DIAGNOSIS — I480 Paroxysmal atrial fibrillation: Secondary | ICD-10-CM | POA: Diagnosis not present

## 2020-09-20 DIAGNOSIS — I10 Essential (primary) hypertension: Secondary | ICD-10-CM | POA: Diagnosis not present

## 2020-09-21 DIAGNOSIS — N2581 Secondary hyperparathyroidism of renal origin: Secondary | ICD-10-CM | POA: Diagnosis not present

## 2020-09-21 DIAGNOSIS — D509 Iron deficiency anemia, unspecified: Secondary | ICD-10-CM | POA: Diagnosis not present

## 2020-09-21 DIAGNOSIS — D631 Anemia in chronic kidney disease: Secondary | ICD-10-CM | POA: Diagnosis not present

## 2020-09-21 DIAGNOSIS — Z992 Dependence on renal dialysis: Secondary | ICD-10-CM | POA: Diagnosis not present

## 2020-09-21 DIAGNOSIS — N186 End stage renal disease: Secondary | ICD-10-CM | POA: Diagnosis not present

## 2020-09-22 ENCOUNTER — Other Ambulatory Visit: Payer: Self-pay | Admitting: Cardiology

## 2020-09-24 DIAGNOSIS — N186 End stage renal disease: Secondary | ICD-10-CM | POA: Diagnosis not present

## 2020-09-24 DIAGNOSIS — Z992 Dependence on renal dialysis: Secondary | ICD-10-CM | POA: Diagnosis not present

## 2020-09-24 DIAGNOSIS — D509 Iron deficiency anemia, unspecified: Secondary | ICD-10-CM | POA: Diagnosis not present

## 2020-09-24 DIAGNOSIS — D631 Anemia in chronic kidney disease: Secondary | ICD-10-CM | POA: Diagnosis not present

## 2020-09-24 DIAGNOSIS — N2581 Secondary hyperparathyroidism of renal origin: Secondary | ICD-10-CM | POA: Diagnosis not present

## 2020-09-26 DIAGNOSIS — I4891 Unspecified atrial fibrillation: Secondary | ICD-10-CM | POA: Diagnosis not present

## 2020-09-26 DIAGNOSIS — Z992 Dependence on renal dialysis: Secondary | ICD-10-CM | POA: Diagnosis not present

## 2020-09-26 DIAGNOSIS — D509 Iron deficiency anemia, unspecified: Secondary | ICD-10-CM | POA: Diagnosis not present

## 2020-09-26 DIAGNOSIS — D631 Anemia in chronic kidney disease: Secondary | ICD-10-CM | POA: Diagnosis not present

## 2020-09-26 DIAGNOSIS — N2581 Secondary hyperparathyroidism of renal origin: Secondary | ICD-10-CM | POA: Diagnosis not present

## 2020-09-26 DIAGNOSIS — N186 End stage renal disease: Secondary | ICD-10-CM | POA: Diagnosis not present

## 2020-09-27 ENCOUNTER — Other Ambulatory Visit: Payer: Self-pay | Admitting: Pharmacist

## 2020-09-27 ENCOUNTER — Ambulatory Visit: Payer: Medicare Other | Admitting: Pharmacist

## 2020-09-27 ENCOUNTER — Other Ambulatory Visit: Payer: Self-pay

## 2020-09-27 ENCOUNTER — Ambulatory Visit (INDEPENDENT_AMBULATORY_CARE_PROVIDER_SITE_OTHER): Payer: Medicare Other | Admitting: Physician Assistant

## 2020-09-27 VITALS — BP 166/70 | HR 55 | Temp 97.6°F | Resp 20 | Ht 65.0 in | Wt 216.3 lb

## 2020-09-27 DIAGNOSIS — I480 Paroxysmal atrial fibrillation: Secondary | ICD-10-CM

## 2020-09-27 DIAGNOSIS — Z992 Dependence on renal dialysis: Secondary | ICD-10-CM

## 2020-09-27 DIAGNOSIS — N186 End stage renal disease: Secondary | ICD-10-CM

## 2020-09-27 DIAGNOSIS — Z7901 Long term (current) use of anticoagulants: Secondary | ICD-10-CM

## 2020-09-27 DIAGNOSIS — I129 Hypertensive chronic kidney disease with stage 1 through stage 4 chronic kidney disease, or unspecified chronic kidney disease: Secondary | ICD-10-CM

## 2020-09-27 DIAGNOSIS — Z5181 Encounter for therapeutic drug level monitoring: Secondary | ICD-10-CM

## 2020-09-27 DIAGNOSIS — R112 Nausea with vomiting, unspecified: Secondary | ICD-10-CM

## 2020-09-27 LAB — POCT INR: INR: 6.3 — AB (ref 2.0–3.0)

## 2020-09-27 NOTE — Progress Notes (Signed)
Anticoagulation Management Jane [name] is a 75 y.o. female who reports to the clinic for monitoring of warfarin treatment.    Indication: atrial fibrillation CHA2DS2 Vasc Score 4 (Age >34, female, HTN hx), HAS-BLED 2 (Age>65, renal disease)  Duration: indefinite Supervising physician: Adrian Prows  Anticoagulation Clinic Visit History:  Patient does not report signs/symptoms of bleeding or thromboembolism.  Other recent changes: No change in diet, medications, lifestyle. Pt continues to have persistent chronic pain in her back, knee, and neck. Significantly affecting patient's QOLs and ADLs.   Reports recent bleeding episodes from the port sites that take longer than previous to heal. Noted to have some mild unexplained bruising in the abdomin and arm. Denies any abdominal pain, melena, or other GI bleed concerns.   Pt reports that she took 7.5 mg warfarin yesterday instead of directed 2.5 mg. Pt reports that she got confused and filled out her pill box incorrectly.   Anticoagulation Episode Summary     Current INR goal:  2.0-3.0  TTR:  67.7 % (2 y)  Next INR check:  10/09/2020  INR from last check:  6.3 (09/27/2020)  Weekly max warfarin dose:    Target end date:  Indefinite  INR check location:    Preferred lab:    Send INR reminders to:     Indications   Paroxysmal atrial fibrillation (HCC) [I48.0] Monitoring for long-term anticoagulant use [Z51.81 Z79.01]        Comments:           Allergies  Allergen Reactions  . Latex Rash  . Penicillins Other (See Comments)    Yeast infection / Childhood  . Sulfa Antibiotics Rash  . Tape Other (See Comments)    Plastic, silicone, and paper tape causes bruising and pulls off skin. Cloth tape works fine    Current Outpatient Medications:  .  acetaminophen (TYLENOL) 500 MG tablet, Take 1,500 mg by mouth daily as needed for headache., Disp: , Rfl:  .  albuterol (PROVENTIL HFA;VENTOLIN HFA) 108 (90 Base) MCG/ACT inhaler, Inhale 1 puff  into the lungs every 6 (six) hours as needed for wheezing or shortness of breath. (Patient taking differently: Inhale 2 puffs into the lungs every 6 (six) hours as needed for wheezing or shortness of breath.), Disp: 6.7 g, Rfl: 2 .  amLODipine (NORVASC) 5 MG tablet, Take 10 mg by mouth daily., Disp: , Rfl:  .  B Complex-C-Zn-Folic Acid (DIALYVITE 294 WITH ZINC) 0.8 MG TABS, Take 1 tablet by mouth daily., Disp: , Rfl:  .  carvedilol (COREG) 12.5 MG tablet, TAKE 1 TABLET BY MOUTH TWICE DAILY WITH A MEAL (Patient taking differently: Take 25 mg by mouth 2 (two) times daily with a meal.), Disp: 60 tablet, Rfl: 0 .  doxycycline (VIBRAMYCIN) 100 MG capsule, Take 100 mg by mouth 2 (two) times daily., Disp: , Rfl:  .  DULoxetine (CYMBALTA) 20 MG capsule, Take 2 capsules by mouth once daily, Disp: 60 capsule, Rfl: 3 .  ethyl chloride spray, Apply 1 application topically daily as needed (port access). , Disp: , Rfl: 12 .  folic acid-vitamin b complex-vitamin c-selenium-zinc (DIALYVITE) 3 MG TABS tablet, Take 1 tablet by mouth daily., Disp: , Rfl:  .  hydrALAZINE (APRESOLINE) 25 MG tablet, TAKE 1 TABLET BY MOUTH THREE TIMES DAILY, Disp: 90 tablet, Rfl: 0 .  HYDROcodone-acetaminophen (NORCO/VICODIN) 5-325 MG tablet, Take 1 tablet by mouth daily as needed for moderate pain., Disp: 30 tablet, Rfl: 0 .  isosorbide dinitrate (ISORDIL) 30 MG tablet,  Take 0.5 tablets (15 mg total) by mouth 2 (two) times daily. (Patient taking differently: Take 30 mg by mouth 3 (three) times daily.), Disp: 90 tablet, Rfl: 2 .  linaclotide (LINZESS) 145 MCG CAPS capsule, Take 1 capsule (145 mcg total) by mouth daily before breakfast., Disp: 30 capsule, Rfl: 3 .  Methoxy PEG-Epoetin Beta (MIRCERA IJ), Mircera, Disp: , Rfl:  .  omeprazole (PRILOSEC) 40 MG capsule, TAKE 1 CAPSULE BY MOUTH TWICE DAILY BEFORE A MEAL, Disp: 60 capsule, Rfl: 0 .  ondansetron (ZOFRAN) 4 MG tablet, Take 1 tablet (4 mg total) by mouth every 12 (twelve) hours as  needed for nausea or vomiting., Disp: 30 tablet, Rfl: 0 .  sevelamer carbonate (RENVELA) 800 MG tablet, Take 1,600 mg by mouth in the morning and at bedtime. Taking with meals, Disp: , Rfl:  .  warfarin (COUMADIN) 5 MG tablet, As directed by coumadin clinic. (Patient taking differently: Take 5-7.5 mg by mouth See admin instructions. Take  7.5 mg every Mon, Fri; 5 mg all other days. Refer to most recent anticoagulation clinic note for most accurate dosing instructions.), Disp: 113 tablet, Rfl: 2 Past Medical History:  Diagnosis Date  . Acid reflux   . Anemia of chronic disease   . Arthritis   . Asthma   . Atrial fibrillation ([place])   . Bilateral carotid bruits   . Complication of anesthesia    "hard to wake up, I have sleep apnea" no CPAP  . Depression   . Diverticulitis   . Duodenal ulcer   . Dysrhythmia    Afib  . ESRD (end stage renal disease) ([place])    MWF [place]  . Headache   . History of blood transfusion   . Hypertension   . Malaise and fatigue   . Orthostatic hypotension   . Shortness of breath    " when I walk to fast"  . Sleep apnea   . Syncope   . Tubulovillous adenoma of colon    ASSESSMENT  Recent Results: The most recent result is correlated with 37.5 mg per week:  Lab Results  Component Value Date   INR 6.3 (A) 09/27/2020   INR 4.0 (A) 09/18/2020   INR 3.4 (A) 08/28/2020    Anticoagulation Dosing: Description   INR above goal. HOLD today and tomorrow, then take 2.5 mg. Then decrease weekly dose to 2.5 mg every Mon, Wed, Fri and 5 mg all other days. Recheck INR in 1 week      INR today: Supratherapeutic.Continues to trend up despite weekly dose decrease at last visit. Inadvertent higher than intended warfarin might also be contributing the the supratheraputic INR.  Denies any relevant changes in her diet, medications, or lifestyle that would explain the elevated INR reading. In setting of persistent supratheraputic, concerns of persistent bleeding  symptoms, and no other identifiable cause, will dose adjust today and closely monitor.   PLAN Weekly dose was decreased by 15.4% to 27.5 mg/week. Hold warfarin today and tomorrow, then take 2.5 mg on 09/30/20. Then decrease weekly dose to 2.5 mg every Mon, Wed, Fri and 5 mg all other days. Recheck INR in 1 week.     There are no Patient Instructions on file for this visit. Patient advised to contact clinic or seek medical attention if signs/symptoms of bleeding or thromboembolism occur.  Patient verbalized understanding by repeating back information and was advised to contact me if further medication-related questions arise.   Follow-up No follow-ups on file.  Blenda Bridegroom  Leafy Kindle, PharmD  15 minutes spent face-to-face with the patient during the encounter. 50% of time spent on education, including signs/sx bleeding and clotting, as well as food and drug interactions with warfarin. 50% of time was spent on fingerprick POC INR sample collection,processing, results determination, and documentation

## 2020-09-27 NOTE — Patient Instructions (Signed)
INR above goal. HOLD today and tomorrow, then take 2.5 mg. Then decrease weekly dose to 2.5 mg every Mon, Wed, Fri and 5 mg all other days. Recheck INR in 1 week

## 2020-09-27 NOTE — Progress Notes (Signed)
HISTORY AND PHYSICAL     CC:  dialysis access Requesting Provider:  [name], [name], MD  HPI: This is a 75 y.o. female here for evaluation for her hemodialysis access and she is accompanied by her daughter.  Pt has hx of right radial cephalic fistula that was placed about 6 years ago in [place].  She states that it has worked well but has been bleeding lately.  She states that it has prolonged bleeding after the needles are removed.  She states she has not had any scabs on the fistula.   She tells me that she is on coumadin for PAF.  She states that her INR last week was over 4.  She states she had it checked yesterday and it was over 7.  She states that they have adjusted her dose.  She denies any blood in her stool or urine.  She states that the arterial needle has had to be repositioned several times to get the machine to run adequately.  She states once in good position, the fistula works well.  She states she has had procedures at [facility] in the past with the last time being in August.    She has hx of excision of right forearm skin nodule and primary repair of right arm AV fistula by Dr. Donzetta Matters in September 2021. His findings as follows:  Nodule itself appeared to be quite granulomatous.  It was adhered to the underlying fistula.  This required primary repair of the fistula.  At completion there was a strong thrill in the fistula.  During our visit, she states that she has had ~ 40lb weight loss over the past 4 months that is unintentional.  She states that when she eats, she gets sick.  She states the only thing she can eat without getting sick are pickles.  She states that she does have a fear of eating bc she is afraid she will get sick.  She denies any diarrhea.    The pt is right hand dominant.    Pt is on dialysis.   Days of dialysis if applicable:  M/W/F    HD center if applicable:  [place] location.   The pt is not on a statin for cholesterol management.  The pt  is not on a daily aspirin.  Other AC:  coumadin The pt is on BB for hypertension.  The pt is not diabetic.   Tobacco hx:  never  Past Medical History:  Diagnosis Date   Acid reflux    Anemia of chronic disease    Arthritis    Asthma    Atrial fibrillation (HCC)    Bilateral carotid bruits    Complication of anesthesia    "hard to wake up, I have sleep apnea" no CPAP   Depression    Diverticulitis    Duodenal ulcer    Dysrhythmia    Afib   ESRD (end stage renal disease) ([place])    MWF [place]   Headache    History of blood transfusion    Hypertension    Malaise and fatigue    Orthostatic hypotension    Shortness of breath    " when I walk to fast"   Sleep apnea    Syncope    Tubulovillous adenoma of colon     Past Surgical History:  Procedure Laterality Date   AV FISTULA PLACEMENT     BACK SURGERY     Lumbar fusion L 4 and  L 5   BIOPSY  01/09/2020   Procedure: BIOPSY;  Surgeon: Rush Landmark Telford Nab., MD;  Location: [place];  Service: Gastroenterology;;   COLONOSCOPY     ENDOSCOPIC MUCOSAL RESECTION N/A 01/09/2020   Procedure: ENDOSCOPIC MUCOSAL RESECTION;  Surgeon: Irving Copas., MD;  Location: [place];  Service: Gastroenterology;  Laterality: N/A;   ESOPHAGOGASTRODUODENOSCOPY     ESOPHAGOGASTRODUODENOSCOPY (EGD) WITH PROPOFOL N/A 01/09/2020   Procedure: ESOPHAGOGASTRODUODENOSCOPY (EGD) WITH PROPOFOL;  Surgeon: Rush Landmark Telford Nab., MD;  Location: [place];  Service: Gastroenterology;  Laterality: N/A;   ESOPHAGOGASTRODUODENOSCOPY (EGD) WITH PROPOFOL N/A 01/13/2020   Procedure: ESOPHAGOGASTRODUODENOSCOPY (EGD) WITH PROPOFOL;  Surgeon: Irene Shipper, MD;  Location: [place];  Service: Gastroenterology;  Laterality: N/A;   EUS N/A 01/09/2020   Procedure: UPPER ENDOSCOPIC ULTRASOUND (EUS) RADIAL;  Surgeon: Irving Copas., MD;  Location: [place];  Service: Gastroenterology;  Laterality: N/A;    HEMOSTASIS CLIP PLACEMENT  01/09/2020   Procedure: HEMOSTASIS CLIP PLACEMENT;  Surgeon: Irving Copas., MD;  Location: [place];  Service: Gastroenterology;;   HEMOSTASIS CONTROL  01/13/2020   Procedure: HEMOSTASIS CONTROL;  Surgeon: Irene Shipper, MD;  [facility];  Service: Gastroenterology;;  hemaspray   HOT HEMOSTASIS N/A 01/13/2020   Procedure: HOT HEMOSTASIS (ARGON PLASMA COAGULATION/BICAP);  Surgeon: Irene Shipper, MD;  Location: [place];  Service: Gastroenterology;  Laterality: N/A;   IR GENERIC HISTORICAL  07/09/2016   IR US GUIDE VASC ACCESS RIGHT 07/09/2016 Arne Cleveland, MD [facility]   IR GENERIC HISTORICAL  07/09/2016   IR FLUORO GUIDE CV LINE RIGHT 07/09/2016 Arne Cleveland, MD [facility]   KNEE ARTHROPLASTY Left    LAPAROSCOPIC SIGMOID COLECTOMY N/A 07/11/2016   Procedure: LAPAROSCOPIC SIGMOID COLECTOMY;  Surgeon: Clovis Riley, MD;  Location: [place];  Service: General;  Laterality: N/A;   MASS EXCISION Right 04/10/2020   Procedure: EXCISION SKIN NODULE RIGHT FOREARM;  Surgeon: Waynetta Sandy, MD;  Location: [place];  Service: Vascular;  Laterality: Right;   SCLEROTHERAPY  01/09/2020   Procedure: Clide Deutscher;  Surgeon: Mansouraty, Telford Nab., MD;  Location: [place];  Service: Gastroenterology;;   Clide Deutscher  01/13/2020   Procedure: Clide Deutscher;  Surgeon: Irene Shipper, MD;  Location: [facility] ENDOSCOPY;  Service: Gastroenterology;;   SUBMUCOSAL LIFTING INJECTION  01/09/2020   Procedure: SUBMUCOSAL LIFTING INJECTION;  Surgeon: Irving Copas., MD;  Location: [facility] ENDOSCOPY;  Service: Gastroenterology;;   TUBAL LIGATION      Allergies  Allergen Reactions   Latex Rash   Penicillins Other (See Comments)    Yeast infection / Childhood   Sulfa Antibiotics Rash   Tape Other (See Comments)    Plastic, silicone, and paper tape causes bruising and pulls off skin. Cloth tape works fine    Current Outpatient  Medications  Medication Sig Dispense Refill   acetaminophen (TYLENOL) 500 MG tablet Take 1,500 mg by mouth daily as needed for headache.     albuterol (PROVENTIL HFA;VENTOLIN HFA) 108 (90 Base) MCG/ACT inhaler Inhale 1 puff into the lungs every 6 (six) hours as needed for wheezing or shortness of breath. (Patient taking differently: Inhale 2 puffs into the lungs every 6 (six) hours as needed for wheezing or shortness of breath.) 6.7 g 2   amLODipine (NORVASC) 5 MG tablet Take 10 mg by mouth daily.     B Complex-C-Zn-Folic Acid (DIALYVITE 132 WITH ZINC) 0.8 MG TABS Take 1 tablet by mouth daily.     carvedilol (COREG) 12.5 MG tablet TAKE  1 TABLET BY MOUTH TWICE DAILY WITH A MEAL (Patient taking differently: Take 25 mg by mouth 2 (two) times daily with a meal.) 60 tablet 0   doxycycline (VIBRAMYCIN) 100 MG capsule Take 100 mg by mouth 2 (two) times daily.     DULoxetine (CYMBALTA) 20 MG capsule Take 2 capsules by mouth once daily 60 capsule 3   ethyl chloride spray Apply 1 application topically daily as needed (port access).   12   folic acid-vitamin b complex-vitamin c-selenium-zinc (DIALYVITE) 3 MG TABS tablet Take 1 tablet by mouth daily.     hydrALAZINE (APRESOLINE) 25 MG tablet TAKE 1 TABLET BY MOUTH THREE TIMES DAILY 90 tablet 0   HYDROcodone-acetaminophen (NORCO/VICODIN) 5-325 MG tablet Take 1 tablet by mouth daily as needed for moderate pain. 30 tablet 0   isosorbide dinitrate (ISORDIL) 30 MG tablet Take 0.5 tablets (15 mg total) by mouth 2 (two) times daily. (Patient taking differently: Take 30 mg by mouth 3 (three) times daily.) 90 tablet 2   linaclotide (LINZESS) 145 MCG CAPS capsule Take 1 capsule (145 mcg total) by mouth daily before breakfast. 30 capsule 3   Methoxy PEG-Epoetin Beta (MIRCERA IJ) Mircera     omeprazole (PRILOSEC) 40 MG capsule TAKE 1 CAPSULE BY MOUTH TWICE DAILY BEFORE A MEAL 60 capsule 0   ondansetron (ZOFRAN) 4 MG tablet Take 1 tablet (4 mg total) by  mouth every 12 (twelve) hours as needed for nausea or vomiting. 30 tablet 0   sevelamer carbonate (RENVELA) 800 MG tablet Take 1,600 mg by mouth in the morning and at bedtime. Taking with meals     warfarin (COUMADIN) 5 MG tablet As directed by coumadin clinic. (Patient taking differently: Take 5-7.5 mg by mouth See admin instructions. Take  7.5 mg every Mon, Fri; 5 mg all other days. Refer to most recent anticoagulation clinic note for most accurate dosing instructions.) 113 tablet 2   No current facility-administered medications for this visit.    Family History  Problem Relation Age of Onset   Heart failure Mother    Stroke Mother    Other Father    Colon cancer Neg Hx    Liver disease Neg Hx    Esophageal cancer Neg Hx    Stomach cancer Neg Hx    Inflammatory bowel disease Neg Hx    Rectal cancer Neg Hx    Pancreatic cancer Neg Hx     Social History   Socioeconomic History   Marital status: Divorced    Spouse name: Not on file   Number of children: 4   Years of education: 14   Highest education level: Associate degree: occupational, Hotel manager, or vocational program  Occupational History   Occupation: Retired  Tobacco Use   Smoking status: Never Smoker   Smokeless tobacco: Never Used  Scientific laboratory technician Use: Never used  Substance and Sexual Activity   Alcohol use: No   Drug use: No   Sexual activity: Never  Other Topics Concern   Not on file  Social History Narrative   HH 1   Divorced   Outpatient dialysis Mon, Wed, Fri   4 children: 1 daughter locally is an Designer, multimedia and 3 sons in [place] Determinants of Health   Financial Resource Strain: Low Risk    Difficulty of Paying Living Expenses: Not hard at all  Food Insecurity: No Food Insecurity   Worried About Charity fundraiser in the Last Year: Never true  Ran Out of Food in the Last Year: Never true  Transportation Needs: No Transportation Needs   Lack of  Transportation (Medical): No   Lack of Transportation (Non-Medical): No  Physical Activity: Inactive   Days of Exercise per Week: 0 days   Minutes of Exercise per Session: 0 min  Stress: Stress Concern Present   Feeling of Stress : To some extent  Social Connections: Moderately Isolated   Frequency of Communication with Friends and Family: More than three times a week   Frequency of Social Gatherings with Friends and Family: Never   Attends Religious Services: More than 4 times per year   Active Member of [employer] or Organizations: No   Attends Music therapist: Never   Marital Status: Divorced  Human resources officer Violence: Not At Risk   Fear of Current or Ex-Partner: No   Emotionally Abused: No   Physically Abused: No   Sexually Abused: No     ROS: [x]  Positive   [ ]  Negative   [ ]  All sytems reviewed and are negative  Cardiac: []  chest pain/pressure []  SOB []  DOE  Vascular: []  pain in legs while walking []  pain in feet when lying flat []  hx of DVT []  swelling in legs  Pulmonary: []  asthma []  wheezing  Neurologic: []  weakness in []  arms []  legs []  numbness in []  arms []  legs [] difficulty speaking or slurred speech  Hematologic: [x]  bleeding problems-see HPI  GI []  GERD [x]  nausea with eating  GU: [x]  CKD/renal failure  [x]  HD---[x]  M/W/F []  T/T/S  Psychiatric: []  hx of major depression  Integumentary: []  rashes []  ulcers  Constitutional: []  fever []  chills [x]  weight loss that is unintentional   PHYSICAL EXAMINATION:  Today's Vitals   09/27/20 1437  BP: (!) 166/70  Pulse: (!) 55  Resp: 20  Temp: 97.6 F (36.4 C)  TempSrc: Temporal  SpO2: 100%  Weight: 216 lb 4.8 oz (98.1 kg)  Height: 5\' 5"  (1.651 m)  PainSc: 0-No pain   Body mass index is 35.99 kg/m.    General:  WDWN female in NAD Gait: Not observed HENT: WNL Pulmonary: normal non-labored breathing , without Rales, rhonchi,  wheezing Cardiac: regular,  without carotid bruits Abdomen: obese Skin: without rashes Vascular Exam/Pulses:   Right Left  Radial 2+ (normal) 2+ (normal)  Ulnar 2+ (normal) Unable to palpate  DP 2+ (normal) 1+ (weak)   Extremities:  Fistula with good thrill and is easily palpable.  Area on the forearm that is aneurysmal.  Needle hole to the right of the fistula is the bleeding area.    Musculoskeletal: no muscle wasting or atrophy  Neurologic: A&O X 3; Speech is fluent/normal     ASSESSMENT/PLAN: 75 y.o. female with ESRD here for evaluation of her hemodialysis access  -pt has a couple of issues today. #1.  She has had prolonged bleeding from a needlestick around her fistula.  She is on coumadin for PAF and her most recent INR was greater than 7.  Her coumadin has been adjusted.  I feel when her INR comes back down, the prolonged bleeding should improve.  She has had some issues with getting the needle positioned correctly and once it is, the machine runs just fine.  She may need a fistulogram in the future and I discussed this with the pt and her daughter.   #2.  unintentional weight loss of ~ 40lbs over past 4 months.  She has nausea/vomiting with eating.  I discussed this  with Dr. Oneida Alar and will schedule the pt for a CTA of the abdomen and pelvis to evaluate for possible mesenteric ischemia or any other reason she may be having unintentional weight loss.  Given Dr.  Donzetta Matters did her surgery, I will have her follow up with him in the next couple of weeks after her CTA.  She and her daughter are in agreement with this plan.    Leontine Locket, [facility] [phone]  Clinic MD:   Oneida Alar

## 2020-09-28 ENCOUNTER — Other Ambulatory Visit: Payer: Self-pay | Admitting: Gastroenterology

## 2020-09-28 ENCOUNTER — Other Ambulatory Visit: Payer: Self-pay

## 2020-09-28 DIAGNOSIS — N186 End stage renal disease: Secondary | ICD-10-CM | POA: Diagnosis not present

## 2020-09-28 DIAGNOSIS — D631 Anemia in chronic kidney disease: Secondary | ICD-10-CM | POA: Diagnosis not present

## 2020-09-28 DIAGNOSIS — Z992 Dependence on renal dialysis: Secondary | ICD-10-CM | POA: Diagnosis not present

## 2020-09-28 DIAGNOSIS — R112 Nausea with vomiting, unspecified: Secondary | ICD-10-CM

## 2020-09-28 DIAGNOSIS — D509 Iron deficiency anemia, unspecified: Secondary | ICD-10-CM | POA: Diagnosis not present

## 2020-09-28 DIAGNOSIS — N2581 Secondary hyperparathyroidism of renal origin: Secondary | ICD-10-CM | POA: Diagnosis not present

## 2020-09-28 MED ORDER — ISOSORBIDE DINITRATE 30 MG PO TABS
30.0000 mg | ORAL_TABLET | Freq: Three times a day (TID) | ORAL | 2 refills | Status: DC
Start: 2020-09-28 — End: 2020-11-29

## 2020-09-28 MED ORDER — HYDRALAZINE HCL 25 MG PO TABS: 25.0000 mg | ORAL_TABLET | Freq: Three times a day (TID) | ORAL | 2 refills | Status: DC

## 2020-10-01 DIAGNOSIS — N2581 Secondary hyperparathyroidism of renal origin: Secondary | ICD-10-CM | POA: Diagnosis not present

## 2020-10-01 DIAGNOSIS — Z992 Dependence on renal dialysis: Secondary | ICD-10-CM | POA: Diagnosis not present

## 2020-10-01 DIAGNOSIS — N186 End stage renal disease: Secondary | ICD-10-CM | POA: Diagnosis not present

## 2020-10-01 DIAGNOSIS — D631 Anemia in chronic kidney disease: Secondary | ICD-10-CM | POA: Diagnosis not present

## 2020-10-01 DIAGNOSIS — D509 Iron deficiency anemia, unspecified: Secondary | ICD-10-CM | POA: Diagnosis not present

## 2020-10-02 ENCOUNTER — Ambulatory Visit: Payer: Medicare Other | Admitting: Cardiology

## 2020-10-03 DIAGNOSIS — N2581 Secondary hyperparathyroidism of renal origin: Secondary | ICD-10-CM | POA: Diagnosis not present

## 2020-10-03 DIAGNOSIS — N186 End stage renal disease: Secondary | ICD-10-CM | POA: Diagnosis not present

## 2020-10-03 DIAGNOSIS — D631 Anemia in chronic kidney disease: Secondary | ICD-10-CM | POA: Diagnosis not present

## 2020-10-03 DIAGNOSIS — D509 Iron deficiency anemia, unspecified: Secondary | ICD-10-CM | POA: Diagnosis not present

## 2020-10-03 DIAGNOSIS — Z992 Dependence on renal dialysis: Secondary | ICD-10-CM | POA: Diagnosis not present

## 2020-10-04 ENCOUNTER — Encounter: Payer: Self-pay | Admitting: Pharmacist

## 2020-10-04 NOTE — Progress Notes (Signed)
CARE PLAN ENTRY  10/04/2020 Name: Jane [name] MRN: [id number] DOB: June 08, 1946  Jane [name] is enrolled in Remote Patient Monitoring/Principle Care Monitoring.  Date of Enrollment: 10/06/19 Supervising physician: Adrian Prows Indication: HTN  Remote Readings: Compliant and Avg BP: 154/78, HR:68  Next scheduled OV: 04/18/21  Pharmacist Clinical Goal(s):  Marland Kitchen Over the next 90 days, patient will demonstrate Improved medication adherence as evidenced by medication fill history . Over the next 90 days, patient will demonstrate improved understanding of prescribed medications and rationale for usage as evidenced by patient teach back . Over the next 90 days, patient will experience decrease in ED visits. ED visits in last 6 months = 0 . Over the next 90 days, patient will not experience hospital admission. Hospital Admissions in last 6 months = 0  Interventions: . Provider and Inter-disciplinary care team collaboration (see longitudinal plan of care) . Comprehensive medication review performed. . Discussed plans with patient for ongoing care management follow up and provided patient with direct contact information for care management team . Collaboration with provider re: medication management  Patient Self Care Activities:  . Self administers medications as prescribed . Attends all scheduled provider appointments . Performs ADL's independently . Performs IADL's independently  Patient Active Problem List   Diagnosis Date Noted  . Nausea without vomiting 05/29/2020  . Generalized osteoarthritis of multiple sites 03/27/2020  . Chronic pain disorder 03/27/2020  . Acute GI bleeding 01/13/2020  . Duodenal ulcer with hemorrhage   . Lower GI bleed 01/12/2020  . Monitoring for long-term anticoagulant use 01/03/2020  . Intestinal metaplasia of gastric mucosa 12/04/2019  . Gastric polyp 12/04/2019  . Tubulovillous adenoma of small intestine 12/03/2019  . Adenomatous duodenal polyp 12/03/2019  .  Abnormal endoscopy of upper gastrointestinal tract 12/03/2019  . Depression, major, recurrent, moderate ([place]) 09/22/2018  . Orthostatic hypotension 09/16/2018  . Bilateral lower extremity edema 08/24/2018  . Abdominal pain, diffuse 05/11/2018  . Body mass index 40.0-44.9, adult ([place]) 03/18/2018  . Unilateral primary osteoarthritis, right knee 03/18/2018  . Chronic pain of right knee 03/18/2018  . Abnormal facial hair 11/10/2017  . Chronic anticoagulation 11/10/2017  . GERD (gastroesophageal reflux disease) 06/16/2017  . Constipation 10/16/2016  . Morbid obesity ([place]) 10/16/2016  . Diverticulitis of large intestine with perforation 06/26/2016  . Anemia in other chronic diseases classified elsewhere 06/25/2016  . Chest pain with moderate risk for cardiac etiology 06/25/2016  . ESRD on hemodialysis ([place]) 06/30/2012  . Paroxysmal atrial fibrillation ([place]) 06/30/2012  . Hypertension with renal disease 06/30/2012   Past Surgical History:  Procedure Laterality Date  . AV FISTULA PLACEMENT    . BACK SURGERY     Lumbar fusion L 4 and L 5  . BIOPSY  01/09/2020   Procedure: BIOPSY;  Surgeon: Rush Landmark Telford Nab., MD;  Location: [place];  Service: Gastroenterology;;  . COLONOSCOPY    . ENDOSCOPIC MUCOSAL RESECTION N/A 01/09/2020   Procedure: ENDOSCOPIC MUCOSAL RESECTION;  Surgeon: Rush Landmark Telford Nab., MD;  Location: [place];  Service: Gastroenterology;  Laterality: N/A;  . ESOPHAGOGASTRODUODENOSCOPY    . ESOPHAGOGASTRODUODENOSCOPY (EGD) WITH PROPOFOL N/A 01/09/2020   Procedure: ESOPHAGOGASTRODUODENOSCOPY (EGD) WITH PROPOFOL;  Surgeon: Rush Landmark Telford Nab., MD;  Location: [place];  Service: Gastroenterology;  Laterality: N/A;  . ESOPHAGOGASTRODUODENOSCOPY (EGD) WITH PROPOFOL N/A 01/13/2020   Procedure: ESOPHAGOGASTRODUODENOSCOPY (EGD) WITH PROPOFOL;  Surgeon: Irene Shipper, MD;  Location: [facility];  Service: Gastroenterology;  Laterality: N/A;  . EUS N/A 01/09/2020    Procedure: UPPER ENDOSCOPIC  ULTRASOUND (EUS) RADIAL;  Surgeon: Rush Landmark Telford Nab., MD;  Location: [facility];  Service: Gastroenterology;  Laterality: N/A;  . HEMOSTASIS CLIP PLACEMENT  01/09/2020   Procedure: HEMOSTASIS CLIP PLACEMENT;  Surgeon: Irving Copas., MD;  Location: [place];  Service: Gastroenterology;;  . HEMOSTASIS CONTROL  01/13/2020   Procedure: HEMOSTASIS CONTROL;  Surgeon: Irene Shipper, MD;  Location: [facility] ENDOSCOPY;  Service: Gastroenterology;;  Vassie Loll  . HOT HEMOSTASIS N/A 01/13/2020   Procedure: HOT HEMOSTASIS (ARGON PLASMA COAGULATION/BICAP);  Surgeon: Irene Shipper, MD;  Location: [place];  Service: Gastroenterology;  Laterality: N/A;  . IR GENERIC HISTORICAL  07/09/2016   IR US GUIDE VASC ACCESS RIGHT 07/09/2016 Arne Cleveland, MD [facility]  . IR GENERIC HISTORICAL  07/09/2016   IR FLUORO GUIDE CV LINE RIGHT 07/09/2016 Arne Cleveland, MD [facility]  . KNEE ARTHROPLASTY Left   . LAPAROSCOPIC SIGMOID COLECTOMY N/A 07/11/2016   Procedure: LAPAROSCOPIC SIGMOID COLECTOMY;  Surgeon: Clovis Riley, MD;  Location: [place];  Service: General;  Laterality: N/A;  . MASS EXCISION Right 04/10/2020   Procedure: EXCISION SKIN NODULE RIGHT FOREARM;  Surgeon: Waynetta Sandy, MD;  Location: [place];  Service: Vascular;  Laterality: Right;  . SCLEROTHERAPY  01/09/2020   Procedure: Clide Deutscher;  Surgeon: Mansouraty, Telford Nab., MD;  Location: [place];  Service: Gastroenterology;;  . Clide Deutscher  01/13/2020   Procedure: Clide Deutscher;  Surgeon: Irene Shipper, MD;  Location: [place];  Service: Gastroenterology;;  . Maryagnes Amos INJECTION  01/09/2020   Procedure: SUBMUCOSAL LIFTING INJECTION;  Surgeon: Irving Copas., MD;  Location: [facility];  Service: Gastroenterology;;  . TUBAL LIGATION     Social History   Socioeconomic History  . Marital status: Divorced    Spouse name: Not on file  . Number of children: 4  .  Years of education: 70  . Highest education level: Associate degree: occupational, Hotel manager, or vocational program  Occupational History  . Occupation: Retired  Tobacco Use  . Smoking status: Never Smoker  . Smokeless tobacco: Never Used  Vaping Use  . Vaping Use: Never used  Substance and Sexual Activity  . Alcohol use: No  . Drug use: No  . Sexual activity: Never  Other Topics Concern  . Not on file  Social History Narrative   HH 1   Divorced   Outpatient dialysis Mon, Wed, Fri   4 children: 1 daughter locally is an Designer, multimedia and 3 sons in [place] Determinants of Health   Financial Resource Strain: [place]   . Difficulty of Paying Living Expenses: Not hard at all  Food Insecurity: No Food Insecurity  . Worried About Charity fundraiser in the Last Year: Never true  . Ran Out of Food in the Last Year: Never true  Transportation Needs: No Transportation Needs  . Lack of Transportation (Medical): No  . Lack of Transportation (Non-Medical): No  Physical Activity: Inactive  . Days of Exercise per Week: 0 days  . Minutes of Exercise per Session: 0 min  Stress: Stress Concern Present  . Feeling of Stress : To some extent  Social Connections: Moderately Isolated  . Frequency of Communication with Friends and Family: More than three times a week  . Frequency of Social Gatherings with Friends and Family: Never  . Attends Religious Services: More than 4 times per year  . Active Member of Clubs or Organizations: No  . Attends Archivist Meetings: Never  . Marital  Status: Divorced   Family History  Problem Relation Age of Onset  . Heart failure Mother   . Stroke Mother   . Other Father   . Colon cancer Neg Hx   . Liver disease Neg Hx   . Esophageal cancer Neg Hx   . Stomach cancer Neg Hx   . Inflammatory bowel disease Neg Hx   . Rectal cancer Neg Hx   . Pancreatic cancer Neg Hx    Allergies  Allergen Reactions  . Latex Rash  . Penicillins Other  (See Comments)    Yeast infection / Childhood  . Sulfa Antibiotics Rash  . Tape Other (See Comments)    Plastic, silicone, and paper tape causes bruising and pulls off skin. Cloth tape works fine   Outpatient Encounter Medications as of 10/04/2020  Medication Sig Note  . acetaminophen (TYLENOL) 500 MG tablet Take 1,500 mg by mouth daily as needed for headache.   . albuterol (PROVENTIL HFA;VENTOLIN HFA) 108 (90 Base) MCG/ACT inhaler Inhale 1 puff into the lungs every 6 (six) hours as needed for wheezing or shortness of breath. (Patient taking differently: Inhale 2 puffs into the lungs every 6 (six) hours as needed for wheezing or shortness of breath.)   . amLODipine (NORVASC) 5 MG tablet Take 10 mg by mouth daily.   . B Complex-C-Zn-Folic Acid (DIALYVITE 542 WITH ZINC) 0.8 MG TABS Take 1 tablet by mouth daily. 07/31/2020: dialysis  . carvedilol (COREG) 12.5 MG tablet TAKE 1 TABLET BY MOUTH TWICE DAILY WITH A MEAL (Patient taking differently: Take 25 mg by mouth 2 (two) times daily with a meal.)   . doxycycline (VIBRAMYCIN) 100 MG capsule Take 100 mg by mouth 2 (two) times daily.   . DULoxetine (CYMBALTA) 20 MG capsule Take 2 capsules by mouth once daily   . ethyl chloride spray Apply 1 application topically daily as needed (port access).    . folic acid-vitamin b complex-vitamin c-selenium-zinc (DIALYVITE) 3 MG TABS tablet Take 1 tablet by mouth daily.   . hydrALAZINE (APRESOLINE) 25 MG tablet Take 1 tablet (25 mg total) by mouth 3 (three) times daily.   Marland Kitchen HYDROcodone-acetaminophen (NORCO/VICODIN) 5-325 MG tablet Take 1 tablet by mouth daily as needed for moderate pain.   . isosorbide dinitrate (ISORDIL) 30 MG tablet Take 1 tablet (30 mg total) by mouth 3 (three) times daily.   Marland Kitchen linaclotide (LINZESS) 145 MCG CAPS capsule Take 1 capsule (145 mcg total) by mouth daily before breakfast.   . Methoxy PEG-Epoetin Beta (MIRCERA IJ) Mircera 07/31/2020: dialysis  . omeprazole (PRILOSEC) 40 MG capsule TAKE  1 CAPSULE BY MOUTH TWICE DAILY BEFORE A MEAL   . ondansetron (ZOFRAN) 4 MG tablet Take 1 tablet (4 mg total) by mouth every 12 (twelve) hours as needed for nausea or vomiting.   . sevelamer carbonate (RENVELA) 800 MG tablet Take 1,600 mg by mouth in the morning and at bedtime. Taking with meals   . warfarin (COUMADIN) 5 MG tablet As directed by coumadin clinic. (Patient taking differently: Take 5 mg by mouth See admin instructions. 2.5 mg every Mon, Wed, Fri; 5 mg all other days, or as directed by coumadin clinic.)    No facility-administered encounter medications on file as of 10/04/2020.   Patient Care Team    Relationship Specialty Notifications Start End  [name], [name], MD PCP - General Family Medicine  10/16/16   Sidonie Dickens, MD Referring Physician Psychiatry  05/15/17   Center, [facility]  05/15/17    Comment: M-W-F      Hypertension   BP goal is:  <140/90  Office blood pressures are  BP Readings from Last 3 Encounters:  09/27/20 (!) 166/70  07/31/20 130/62  07/10/20 134/60    Patient is currently uncontrolled on the following medications: amlodipine 10 mg, carvedilol 25 mg BID, hydralazine 25 mg TID, isordil 30 mg TID   Patient checks BP at home daily  Patient home BP readings are ranging: 119-180/61-89  Patient has failed these meds in the past: losartan, spironolactone,   We discussed diet and exercise extensively  Plan  Continue current medications and control with diet and exercise     Visit Information SDOH (Social Determinants of Health) assessments performed: Yes.  Ms. Ridgeway was given information about Principle Care Management/Remote Patient Monitoring services today including:  1. RPM/PCM service includes personalized support from designated clinical staff supervised by her physician, including individualized plan of care and coordination with other care providers 2. 24/7 contact phone numbers for assistance for urgent and routine  care needs. 3. Standard insurance, coinsurance, copays and deductibles apply for principle care management only during months in which we provide at least 30 minutes of these services. Most insurances cover these services at 100%, however patients may be responsible for any copay, coinsurance and/or deductible if applicable. This service may help you avoid the need for more expensive face-to-face services. 4. Only one practitioner may furnish and bill the service in a calendar month. 5. The patient may stop PCM/RPM services at any time (effective at the end of the month) by phone call to the office staff.  Patient agreed to services and verbal consent obtained.   [name], Pharm.D. [facility] [phone]

## 2020-10-05 DIAGNOSIS — D509 Iron deficiency anemia, unspecified: Secondary | ICD-10-CM | POA: Diagnosis not present

## 2020-10-05 DIAGNOSIS — D631 Anemia in chronic kidney disease: Secondary | ICD-10-CM | POA: Diagnosis not present

## 2020-10-05 DIAGNOSIS — Z992 Dependence on renal dialysis: Secondary | ICD-10-CM | POA: Diagnosis not present

## 2020-10-05 DIAGNOSIS — N2581 Secondary hyperparathyroidism of renal origin: Secondary | ICD-10-CM | POA: Diagnosis not present

## 2020-10-05 DIAGNOSIS — N186 End stage renal disease: Secondary | ICD-10-CM | POA: Diagnosis not present

## 2020-10-08 DIAGNOSIS — D631 Anemia in chronic kidney disease: Secondary | ICD-10-CM | POA: Diagnosis not present

## 2020-10-08 DIAGNOSIS — Z992 Dependence on renal dialysis: Secondary | ICD-10-CM | POA: Diagnosis not present

## 2020-10-08 DIAGNOSIS — N2581 Secondary hyperparathyroidism of renal origin: Secondary | ICD-10-CM | POA: Diagnosis not present

## 2020-10-08 DIAGNOSIS — D509 Iron deficiency anemia, unspecified: Secondary | ICD-10-CM | POA: Diagnosis not present

## 2020-10-08 DIAGNOSIS — N186 End stage renal disease: Secondary | ICD-10-CM | POA: Diagnosis not present

## 2020-10-09 ENCOUNTER — Ambulatory Visit: Payer: Medicare Other | Admitting: Family Medicine

## 2020-10-11 ENCOUNTER — Ambulatory Visit: Payer: Medicare Other | Admitting: Pharmacist

## 2020-10-11 ENCOUNTER — Ambulatory Visit
Admission: RE | Admit: 2020-10-11 | Discharge: 2020-10-11 | Disposition: A | Discharge status: Home / Self Care | Payer: Medicare Other | Source: Ambulatory Visit | Attending: Vascular Surgery | Admitting: Vascular Surgery

## 2020-10-11 ENCOUNTER — Other Ambulatory Visit: Payer: Self-pay

## 2020-10-11 DIAGNOSIS — Z5181 Encounter for therapeutic drug level monitoring: Secondary | ICD-10-CM | POA: Diagnosis not present

## 2020-10-11 DIAGNOSIS — R112 Nausea with vomiting, unspecified: Secondary | ICD-10-CM

## 2020-10-11 DIAGNOSIS — N281 Cyst of kidney, acquired: Secondary | ICD-10-CM | POA: Diagnosis not present

## 2020-10-11 DIAGNOSIS — I480 Paroxysmal atrial fibrillation: Secondary | ICD-10-CM | POA: Diagnosis not present

## 2020-10-11 DIAGNOSIS — K429 Umbilical hernia without obstruction or gangrene: Secondary | ICD-10-CM | POA: Diagnosis not present

## 2020-10-11 DIAGNOSIS — N19 Unspecified kidney failure: Secondary | ICD-10-CM | POA: Diagnosis not present

## 2020-10-11 DIAGNOSIS — D259 Leiomyoma of uterus, unspecified: Secondary | ICD-10-CM | POA: Diagnosis not present

## 2020-10-11 DIAGNOSIS — Z7901 Long term (current) use of anticoagulants: Secondary | ICD-10-CM | POA: Diagnosis not present

## 2020-10-11 LAB — POCT INR: INR: 2.7 (ref 2.0–3.0)

## 2020-10-11 MED ORDER — IOPAMIDOL (ISOVUE-370) INJECTION 76%
75.0000 mL | Freq: Once | INTRAVENOUS | Status: AC | PRN
  Administered 2020-10-11: 75 mL via INTRAVENOUS

## 2020-10-11 NOTE — Patient Instructions (Signed)
INR at goal. Continue taking 2.5 mg every Mon, Wed, Fri; 5 mg all other days. Recheck INR in 2 week

## 2020-10-11 NOTE — Progress Notes (Signed)
Anticoagulation Management Jane [name] is a 75 y.o. female who reports to the clinic for monitoring of warfarin treatment.    Indication: atrial fibrillation CHA2DS2 Vasc Score 4 (Age >68, female, HTN hx), HAS-BLED 2 (Age>65, renal disease)  Duration: indefinite Supervising physician: Adrian Prows  Anticoagulation Clinic Visit History:  Patient does not report signs/symptoms of bleeding or thromboembolism.  Other recent changes: No change in diet, medications, lifestyle. Pt continues to have persistent chronic pain in her back, knee, and neck. Significantly affecting patient's QOLs and ADLs.   Port sites bleeding improved but still persistent. Recent Vascular OV, who recommended better INR control and continued monitoring. Recent complains of persistent N/V and decreased appetite that is associated with unintententional weight loss. Completed abdominal CTA earlier today. Has a f/u GI appt scheduled next week.  Reviewed and updated med list together.   Anticoagulation Episode Summary    Current INR goal:  2.0-3.0  TTR:  66.6 % (2 y)  Next INR check:  10/25/2020  INR from last check:  2.7 (10/11/2020)  Weekly max warfarin dose:    Target end date:  Indefinite  INR check location:    Preferred lab:    Send INR reminders to:     Indications   Paroxysmal atrial fibrillation (HCC) [I48.0] Monitoring for long-term anticoagulant use [Z51.81 Z79.01]       Comments:          Allergies  Allergen Reactions  . Latex Rash  . Penicillins Other (See Comments)    Yeast infection / Childhood  . Sulfa Antibiotics Rash  . Tape Other (See Comments)    Plastic, silicone, and paper tape causes bruising and pulls off skin. Cloth tape works fine    Current Outpatient Medications:  .  acetaminophen (TYLENOL) 500 MG tablet, Take 1,500 mg by mouth daily as needed for headache., Disp: , Rfl:  .  albuterol (PROVENTIL HFA;VENTOLIN HFA) 108 (90 Base) MCG/ACT inhaler, Inhale 1 puff into the lungs every 6  (six) hours as needed for wheezing or shortness of breath. (Patient taking differently: Inhale 2 puffs into the lungs every 6 (six) hours as needed for wheezing or shortness of breath.), Disp: 6.7 g, Rfl: 2 .  amLODipine (NORVASC) 5 MG tablet, Take 5 mg by mouth daily., Disp: , Rfl:  .  B Complex-C-Zn-Folic Acid (DIALYVITE 478 WITH ZINC) 0.8 MG TABS, Take 1 tablet by mouth daily., Disp: , Rfl:  .  carvedilol (COREG) 12.5 MG tablet, TAKE 1 TABLET BY MOUTH TWICE DAILY WITH A MEAL (Patient taking differently: Take 25 mg by mouth 2 (two) times daily with a meal.), Disp: 60 tablet, Rfl: 0 .  DULoxetine (CYMBALTA) 20 MG capsule, Take 2 capsules by mouth once daily, Disp: 60 capsule, Rfl: 3 .  ethyl chloride spray, Apply 1 application topically daily as needed (port access). , Disp: , Rfl: 12 .  folic acid-vitamin b complex-vitamin c-selenium-zinc (DIALYVITE) 3 MG TABS tablet, Take 1 tablet by mouth daily., Disp: , Rfl:  .  hydrALAZINE (APRESOLINE) 25 MG tablet, Take 1 tablet (25 mg total) by mouth 3 (three) times daily., Disp: 270 tablet, Rfl: 2 .  HYDROcodone-acetaminophen (NORCO/VICODIN) 5-325 MG tablet, Take 1 tablet by mouth daily as needed for moderate pain., Disp: 30 tablet, Rfl: 0 .  isosorbide dinitrate (ISORDIL) 30 MG tablet, Take 1 tablet (30 mg total) by mouth 3 (three) times daily., Disp: 270 tablet, Rfl: 2 .  linaclotide (LINZESS) 145 MCG CAPS capsule, Take 1 capsule (145 mcg total) by  mouth daily before breakfast., Disp: 30 capsule, Rfl: 3 .  omeprazole (PRILOSEC) 40 MG capsule, TAKE 1 CAPSULE BY MOUTH TWICE DAILY BEFORE A MEAL, Disp: 60 capsule, Rfl: 0 .  warfarin (COUMADIN) 5 MG tablet, As directed by coumadin clinic. (Patient taking differently: Take 5 mg by mouth See admin instructions. 2.5 mg every Mon, Wed, Fri; 5 mg all other days, or as directed by coumadin clinic.), Disp: 113 tablet, Rfl: 2 .  Methoxy PEG-Epoetin Beta (MIRCERA IJ), Mircera, Disp: , Rfl:  .  sevelamer carbonate  (RENVELA) 800 MG tablet, Take 1,600 mg by mouth in the morning and at bedtime. Taking with meals (Patient not taking: Reported on 10/11/2020), Disp: , Rfl:  Past Medical History:  Diagnosis Date  . Acid reflux   . Anemia of chronic disease   . Arthritis   . Asthma   . Atrial fibrillation ([place])   . Bilateral carotid bruits   . Complication of anesthesia    "hard to wake up, I have sleep apnea" no CPAP  . Depression   . Diverticulitis   . Duodenal ulcer   . Dysrhythmia    Afib  . ESRD (end stage renal disease) ([place])    MWF [place]  . Headache   . History of blood transfusion   . Hypertension   . Malaise and fatigue   . Orthostatic hypotension   . Shortness of breath    " when I walk to fast"  . Sleep apnea   . Syncope   . Tubulovillous adenoma of colon    ASSESSMENT  Recent Results: The most recent result is correlated with 27.5 mg per week:  Lab Results  Component Value Date   INR 2.7 10/11/2020   INR 6.3 (A) 09/27/2020   INR 4.0 (A) 09/18/2020    Anticoagulation Dosing: Description   INR at goal. Continue taking 2.5 mg every Mon, Wed, Fri; 5 mg all other days. Recheck INR in 2 week      INR today: Therapeutic.Following recent weekly dose decrease. Denies any relevant changes in her diet, medications, or lifestyle. Concerns of port site bleeding improving. Will continue current weekly dose and close monitoring to ensure that INR remains within therapeutic range.  PLAN Weekly dose was unchanged by 0% to 27.5 mg/week. Continue current weekly dose of 2.5 mg every Mon, Wed, Fri and 5 mg all other days. Recheck INR in 2 week.     Patient Instructions  INR at goal. Continue taking 2.5 mg every Mon, Wed, Fri; 5 mg all other days. Recheck INR in 2 week  Patient advised to contact clinic or seek medical attention if signs/symptoms of bleeding or thromboembolism occur.  Patient verbalized understanding by repeating back information and was advised to contact me if  further medication-related questions arise.   Follow-up Return in about 2 weeks (around 10/25/2020).  [name], PharmD  15 minutes spent face-to-face with the patient during the encounter. 50% of time spent on education, including signs/sx bleeding and clotting, as well as food and drug interactions with warfarin. 50% of time was spent on fingerprick POC INR sample collection,processing, results determination, and documentation

## 2020-10-12 ENCOUNTER — Ambulatory Visit (INDEPENDENT_AMBULATORY_CARE_PROVIDER_SITE_OTHER): Payer: Medicare Other | Admitting: Vascular Surgery

## 2020-10-12 ENCOUNTER — Encounter: Payer: Self-pay | Admitting: Vascular Surgery

## 2020-10-12 VITALS — BP 156/74 | HR 71 | Temp 98.3°F | Resp 20 | Ht 65.0 in | Wt 216.0 lb

## 2020-10-12 DIAGNOSIS — D631 Anemia in chronic kidney disease: Secondary | ICD-10-CM | POA: Diagnosis not present

## 2020-10-12 DIAGNOSIS — N186 End stage renal disease: Secondary | ICD-10-CM | POA: Diagnosis not present

## 2020-10-12 DIAGNOSIS — D509 Iron deficiency anemia, unspecified: Secondary | ICD-10-CM | POA: Diagnosis not present

## 2020-10-12 DIAGNOSIS — R112 Nausea with vomiting, unspecified: Secondary | ICD-10-CM | POA: Diagnosis not present

## 2020-10-12 DIAGNOSIS — Z992 Dependence on renal dialysis: Secondary | ICD-10-CM | POA: Diagnosis not present

## 2020-10-12 DIAGNOSIS — N2581 Secondary hyperparathyroidism of renal origin: Secondary | ICD-10-CM | POA: Diagnosis not present

## 2020-10-12 NOTE — Progress Notes (Signed)
Patient ID: Jaziah Kwasnik, female   DOB: 30-Jun-1946, 75 y.o.   MRN: [id number]  Reason for Consult: Follow-up   Referred by [name], [name], MD  Subjective:     HPI:  Carrine Kroboth is a 75 y.o. female with end-stage renal disease has recently had bleeding from her fistula.  She has also had a proximal 40 pound weight loss over the past 4 months.  She is here today with CT angio of her abdomen pelvis to evaluate for mesenteric ischemia and to discuss her fistula.  At last visit her INR was supratherapeutic.  She now states that her INR is within the normal range however she continues to have bleeding most recently today on dialysis she bled for 40 minutes.  As it pertains to her eating she states that she does not feel well particularly after eating protein she usually has to eat something sweet or sour to help her not vomit.  She does not have any pain with eating.  She is having normal bowel function.  She states that she does have stents in her upper arm on the right  Past Medical History:  Diagnosis Date  . Acid reflux   . Anemia of chronic disease   . Arthritis   . Asthma   . Atrial fibrillation ([place])   . Bilateral carotid bruits   . Complication of anesthesia    "hard to wake up, I have sleep apnea" no CPAP  . Depression   . Diverticulitis   . Duodenal ulcer   . Dysrhythmia    Afib  . ESRD (end stage renal disease) ([place])    MWF [place]  . Headache   . History of blood transfusion   . Hypertension   . Malaise and fatigue   . Orthostatic hypotension   . Shortness of breath    " when I walk to fast"  . Sleep apnea   . Syncope   . Tubulovillous adenoma of colon    Family History  Problem Relation Age of Onset  . Heart failure Mother   . Stroke Mother   . Other Father   . Colon cancer Neg Hx   . Liver disease Neg Hx   . Esophageal cancer Neg Hx   . Stomach cancer Neg Hx   . Inflammatory bowel disease Neg Hx   . Rectal cancer Neg Hx   . Pancreatic cancer Neg Hx     Past Surgical History:  Procedure Laterality Date  . AV FISTULA PLACEMENT    . BACK SURGERY     Lumbar fusion L 4 and L 5  . BIOPSY  01/09/2020   Procedure: BIOPSY;  Surgeon: Rush Landmark Telford Nab., MD;  Location: [place];  Service: Gastroenterology;;  . COLONOSCOPY    . ENDOSCOPIC MUCOSAL RESECTION N/A 01/09/2020   Procedure: ENDOSCOPIC MUCOSAL RESECTION;  Surgeon: Rush Landmark Telford Nab., MD;  Location: [place];  Service: Gastroenterology;  Laterality: N/A;  . ESOPHAGOGASTRODUODENOSCOPY    . ESOPHAGOGASTRODUODENOSCOPY (EGD) WITH PROPOFOL N/A 01/09/2020   Procedure: ESOPHAGOGASTRODUODENOSCOPY (EGD) WITH PROPOFOL;  Surgeon: Rush Landmark Telford Nab., MD;  Location: [place];  Service: Gastroenterology;  Laterality: N/A;  . ESOPHAGOGASTRODUODENOSCOPY (EGD) WITH PROPOFOL N/A 01/13/2020   Procedure: ESOPHAGOGASTRODUODENOSCOPY (EGD) WITH PROPOFOL;  Surgeon: Irene Shipper, MD;  Location: [place];  Service: Gastroenterology;  Laterality: N/A;  . EUS N/A 01/09/2020   Procedure: UPPER ENDOSCOPIC ULTRASOUND (EUS) RADIAL;  Surgeon: Rush Landmark Telford Nab., MD;  Location: [place];  Service: Gastroenterology;  Laterality: N/A;  .  HEMOSTASIS CLIP PLACEMENT  01/09/2020   Procedure: HEMOSTASIS CLIP PLACEMENT;  Surgeon: Irving Copas., MD;  Location: [place];  Service: Gastroenterology;;  . HEMOSTASIS CONTROL  01/13/2020   Procedure: HEMOSTASIS CONTROL;  Surgeon: Irene Shipper, MD;  Location: [facility] ENDOSCOPY;  Service: Gastroenterology;;  Vassie Loll  . HOT HEMOSTASIS N/A 01/13/2020   Procedure: HOT HEMOSTASIS (ARGON PLASMA COAGULATION/BICAP);  Surgeon: Irene Shipper, MD;  Location: [place];  Service: Gastroenterology;  Laterality: N/A;  . IR GENERIC HISTORICAL  07/09/2016   IR US GUIDE VASC ACCESS RIGHT 07/09/2016 Arne Cleveland, MD [facility]  . IR GENERIC HISTORICAL  07/09/2016   IR FLUORO GUIDE CV LINE RIGHT 07/09/2016 Arne Cleveland, MD [facility]  . KNEE  ARTHROPLASTY Left   . LAPAROSCOPIC SIGMOID COLECTOMY N/A 07/11/2016   Procedure: LAPAROSCOPIC SIGMOID COLECTOMY;  Surgeon: Clovis Riley, MD;  Location: [place];  Service: General;  Laterality: N/A;  . MASS EXCISION Right 04/10/2020   Procedure: EXCISION SKIN NODULE RIGHT FOREARM;  Surgeon: Waynetta Sandy, MD;  Location: [place];  Service: Vascular;  Laterality: Right;  . SCLEROTHERAPY  01/09/2020   Procedure: Clide Deutscher;  Surgeon: Mansouraty, Telford Nab., MD;  Location: [place];  Service: Gastroenterology;;  . Clide Deutscher  01/13/2020   Procedure: Clide Deutscher;  Surgeon: Irene Shipper, MD;  Location: [facility] ENDOSCOPY;  Service: Gastroenterology;;  . Maryagnes Amos INJECTION  01/09/2020   Procedure: SUBMUCOSAL LIFTING INJECTION;  Surgeon: Irving Copas., MD;  Location: [place];  Service: Gastroenterology;;  . TUBAL LIGATION      Short Social History:  Social History   Tobacco Use  . Smoking status: Never Smoker  . Smokeless tobacco: Never Used  Substance Use Topics  . Alcohol use: No    Allergies  Allergen Reactions  . Latex Rash  . Penicillins Other (See Comments)    Yeast infection / Childhood  . Sulfa Antibiotics Rash  . Tape Other (See Comments)    Plastic, silicone, and paper tape causes bruising and pulls off skin. Cloth tape works fine    Current Outpatient Medications  Medication Sig Dispense Refill  . acetaminophen (TYLENOL) 500 MG tablet Take 1,500 mg by mouth daily as needed for headache.    . albuterol (PROVENTIL HFA;VENTOLIN HFA) 108 (90 Base) MCG/ACT inhaler Inhale 1 puff into the lungs every 6 (six) hours as needed for wheezing or shortness of breath. (Patient taking differently: Inhale 2 puffs into the lungs every 6 (six) hours as needed for wheezing or shortness of breath.) 6.7 g 2  . amLODipine (NORVASC) 5 MG tablet Take 5 mg by mouth daily.    . B Complex-C-Zn-Folic Acid (DIALYVITE 762 WITH ZINC) 0.8 MG TABS Take 1 tablet by  mouth daily.    . carvedilol (COREG) 12.5 MG tablet TAKE 1 TABLET BY MOUTH TWICE DAILY WITH A MEAL (Patient taking differently: Take 25 mg by mouth 2 (two) times daily with a meal.) 60 tablet 0  . DULoxetine (CYMBALTA) 20 MG capsule Take 2 capsules by mouth once daily 60 capsule 3  . ethyl chloride spray Apply 1 application topically daily as needed (port access).   12  . folic acid-vitamin b complex-vitamin c-selenium-zinc (DIALYVITE) 3 MG TABS tablet Take 1 tablet by mouth daily.    . hydrALAZINE (APRESOLINE) 25 MG tablet Take 1 tablet (25 mg total) by mouth 3 (three) times daily. 270 tablet 2  . HYDROcodone-acetaminophen (NORCO/VICODIN) 5-325 MG tablet Take 1 tablet by mouth daily as needed for moderate pain. [place]  tablet 0  . isosorbide dinitrate (ISORDIL) 30 MG tablet Take 1 tablet (30 mg total) by mouth 3 (three) times daily. 270 tablet 2  . linaclotide (LINZESS) 145 MCG CAPS capsule Take 1 capsule (145 mcg total) by mouth daily before breakfast. 30 capsule 3  . Methoxy PEG-Epoetin Beta (MIRCERA IJ) Mircera    . omeprazole (PRILOSEC) 40 MG capsule TAKE 1 CAPSULE BY MOUTH TWICE DAILY BEFORE A MEAL 60 capsule 0  . sevelamer carbonate (RENVELA) 800 MG tablet Take 1,600 mg by mouth in the morning and at bedtime. Taking with meals    . warfarin (COUMADIN) 5 MG tablet As directed by coumadin clinic. (Patient taking differently: Take 5 mg by mouth See admin instructions. 2.5 mg every Mon, Wed, Fri; 5 mg all other days, or as directed by coumadin clinic.) 113 tablet 2   No current facility-administered medications for this visit.    Review of Systems  Constitutional: Positive for unexpected weight change.  HENT: HENT negative.  Eyes: Eyes negative.  Cardiovascular: Cardiovascular negative.  GI: Gastrointestinal negative.  Musculoskeletal: Musculoskeletal negative.  Skin: Skin negative.  Neurological: Neurological negative. Hematologic: Positive for bruises/bleeds easily.        Objective:   Objective  Vitals:   10/12/20 1416  BP: (!) 156/74  Pulse: 71  Resp: 20  Temp: 98.3 F (36.8 C)  SpO2: 93%    Physical Exam HENT:     Head: Normocephalic.     Nose:     Comments: Wearing a mask Eyes:     Pupils: Pupils are equal, round, and reactive to light.  Cardiovascular:     Rate and Rhythm: Normal rate.  Pulmonary:     Effort: Pulmonary effort is normal.  Abdominal:     Palpations: Abdomen is soft.  Musculoskeletal:        General: Normal range of motion.     Cervical back: Neck supple.     Right lower leg: No edema.     Comments: Right forearm fistula has palpable thrill bandage in place from dialysis today  Skin:    General: Skin is warm and dry.     Capillary Refill: Capillary refill takes less than 2 seconds.  Neurological:     General: No focal deficit present.     Mental Status: She is alert.  Psychiatric:        Mood and Affect: Mood normal.        Behavior: Behavior normal.        Thought Content: Thought content normal.        Judgment: Judgment normal.     Data: CTA ABDOMEN AND PELVIS WITH CONTRAST  TECHNIQUE: Multidetector CT imaging of the abdomen and pelvis was performed using the standard protocol during bolus administration of intravenous contrast. Multiplanar reconstructed images and MIPs were obtained and reviewed to evaluate the vascular anatomy.  CONTRAST:  67mL ISOVUE-370 IOPAMIDOL (ISOVUE-370) INJECTION 76%  COMPARISON:  10/06/2019 and previous  FINDINGS: VASCULAR  Aorta: Moderate calcified atheromatous plaque. No aneurysm, dissection, or stenosis.  Celiac: Calcified ostial plaque resulting in short segment in stenosis of at least mild severity, patent distally.  SMA: Partially calcified ostial plaque resulting in short segment stenosis of doubtful hemodynamic significance, patent distally. Replaced right hepatic arterial supply, an anatomic variant.  Renals: Single left, with calcified proximal plaque, no  stenosis. Single right, patent.  IMA: Patent without evidence of aneurysm, dissection, vasculitis or significant stenosis.  Inflow: Scattered calcified plaque throughout the iliac arterial system. Mild  tortuosity. No aneurysm, dissection, or high-grade stenosis.  Proximal Outflow: Atheromatous, patent.  Veins: Patent hepatic veins, portal vein, SM V, bilateral renal veins. No venous pathology evident.  Review of the MIP images confirms the above findings.  NON-VASCULAR  Lower chest: No acute abnormality.  Hepatobiliary: No liver lesion or biliary ductal dilatation. Distended gallbladder.  Pancreas: Unremarkable. No pancreatic ductal dilatation or surrounding inflammatory changes.  Spleen: Normal in size without focal abnormality.  Adrenals/Urinary Tract: Adrenal glands unremarkable. Extensive bilateral renal cystic changes of dialysis. No hydronephrosis. Urinary bladder decompressed.  Stomach/Bowel: Stomach and small bowel decompressed. Appendix not discretely identified. No pericecal inflammatory change. The colon is decompressed. Anastomotic staple line in the distal sigmoid segment. No acute findings.  Lymphatic: No abdominal or pelvic adenopathy.  Reproductive: Calcified uterine fibroids. Bilateral adnexa are unremarkable.  Other: Trace pelvic ascites.  No free air.  Musculoskeletal: Small paraumbilical hernia containing only mesenteric fat. Mild lumbar dextroscoliosis apex L3-4. Instrumented PLIF L4-5. Advanced adjacent-level degenerative disc disease L3-4 and L5-S1. No fracture or worrisome bone lesion.  IMPRESSION: 1. No severe proximal mesenteric arterial occlusive disease to suggest an etiology of occlusive mesenteric ischemia.      Assessment/Plan:     75 year old female with end-stage renal disease and persistent bleeding on dialysis concern for proximal obstructive process with known stents in her right upper arm.  From the  standpoint we have plan for right upper extremity fistulogram on a nondialysis day in the near future.  I discussed with her that we will hold Coumadin 72 hours prior to procedure and that this will likely be performed by one of my partners given that she dialyzes on Mondays, Wednesdays and Fridays.  CTA reviewed with patient today without any mesenteric stenosis to suggest chronic mesenteric ischemia.     Waynetta Sandy MD [facility]

## 2020-10-15 ENCOUNTER — Other Ambulatory Visit: Payer: Self-pay

## 2020-10-15 DIAGNOSIS — D631 Anemia in chronic kidney disease: Secondary | ICD-10-CM | POA: Diagnosis not present

## 2020-10-15 DIAGNOSIS — D509 Iron deficiency anemia, unspecified: Secondary | ICD-10-CM | POA: Diagnosis not present

## 2020-10-15 DIAGNOSIS — N2581 Secondary hyperparathyroidism of renal origin: Secondary | ICD-10-CM | POA: Diagnosis not present

## 2020-10-15 DIAGNOSIS — N186 End stage renal disease: Secondary | ICD-10-CM | POA: Diagnosis not present

## 2020-10-15 DIAGNOSIS — Z992 Dependence on renal dialysis: Secondary | ICD-10-CM | POA: Diagnosis not present

## 2020-10-15 MED ORDER — SODIUM CHLORIDE 0.9% FLUSH: 3.0000 mL | Freq: Two times a day (BID) | INTRAVENOUS | Status: DC

## 2020-10-15 MED ORDER — SODIUM CHLORIDE 0.9% FLUSH: 3.0000 mL | INTRAVENOUS | Status: DC | PRN

## 2020-10-15 MED ORDER — SODIUM CHLORIDE 0.9 % IV SOLN: 250.0000 mL | INTRAVENOUS | Status: DC | PRN

## 2020-10-16 ENCOUNTER — Telehealth: Payer: Self-pay | Admitting: Pharmacist

## 2020-10-16 ENCOUNTER — Telehealth: Payer: Self-pay

## 2020-10-16 ENCOUNTER — Encounter: Payer: Self-pay | Admitting: Gastroenterology

## 2020-10-16 ENCOUNTER — Ambulatory Visit (INDEPENDENT_AMBULATORY_CARE_PROVIDER_SITE_OTHER): Payer: Medicare Other | Admitting: Gastroenterology

## 2020-10-16 VITALS — BP 160/60 | HR 64 | Ht 64.0 in | Wt 215.1 lb

## 2020-10-16 DIAGNOSIS — K31A0 Gastric intestinal metaplasia, unspecified: Secondary | ICD-10-CM | POA: Diagnosis not present

## 2020-10-16 DIAGNOSIS — K5909 Other constipation: Secondary | ICD-10-CM

## 2020-10-16 DIAGNOSIS — D132 Benign neoplasm of duodenum: Secondary | ICD-10-CM | POA: Diagnosis not present

## 2020-10-16 DIAGNOSIS — D1330 Benign neoplasm of unspecified part of small intestine: Secondary | ICD-10-CM | POA: Diagnosis not present

## 2020-10-16 DIAGNOSIS — R1013 Epigastric pain: Secondary | ICD-10-CM | POA: Diagnosis not present

## 2020-10-16 MED ORDER — SUCRALFATE 1 GM/10ML PO SUSP: 1.0000 g | Freq: Two times a day (BID) | ORAL | 1 refills | Status: DC

## 2020-10-16 NOTE — Patient Instructions (Signed)
Lab order have been given to you to take to your dialysis. Please have lab results faxed to [phone] attn: Hazelyn Kallen  We have sent the following medications to your pharmacy for you to pick up at your convenience: Carafate   If you are age 77 or older, your body mass index should be between 23-30. Your Body mass index is 36.93 kg/m. If this is out of the aforementioned range listed, please consider follow up with your Primary Care Provider.  You have been scheduled for an endoscopy. Please follow written instructions given to you at your visit today. If you use inhalers (even only as needed), please bring them with you on the day of your procedure.  You will be contaced by our office prior to your procedure for directions on holding your Coumadin/Warfarin.  If you do not hear from our office 1 week prior to your scheduled procedure, please call [phone] to discuss.  Due to recent changes in healthcare laws, you may see the results of your imaging and laboratory studies on MyChart before your provider has had a chance to review them.  We understand that in some cases there may be results that are confusing or concerning to you. Not all laboratory results come back in the same time frame and the provider may be waiting for multiple results in order to interpret others.  Please give [place] 48 hours in order for your provider to thoroughly review all the results before contacting the office for clarification of your results.   Thank you for choosing me and [facility].  Dr. Rush Landmark

## 2020-10-16 NOTE — Telephone Encounter (Signed)
Request for surgical clearance:     Endoscopy Procedure  What type of surgery is being performed?  EGD +EMR   When is this surgery scheduled?     11/29/20  What type of clearance is required ?   Pharmacy  Are there any medications that need to be held prior to surgery and how long? Coumadin x5 days prior procedure  Practice name and name of physician performing surgery?      [facility]  What is your office phone and fax number?      Phone- [phone]  [phone]  Anesthesia type (None, local, MAC, general) ?       MAC

## 2020-10-16 NOTE — Telephone Encounter (Signed)
Pt called to notify the coumadin clinic that her fistulagram has been scheduled for 10/18/20. Pt was instructed to hold warfarin 3 days prior to the procedure. Last warfarin dose on 10/14/20. Pt aware of reviewing with surgeon of when to restart warfarin. Instructed pt to take 7.5 mg on 10/18/20 if okay to restart the same day. Planned INR recheck on 10/25/20, 1 week post-op.

## 2020-10-16 NOTE — Telephone Encounter (Addendum)
Pt doesn't follow with [facility] provider or have her warfarin managed by [place] - recommend forwarding to Dr Irven Shelling office for clearance.

## 2020-10-16 NOTE — Telephone Encounter (Signed)
Faxed to requesting surgeons office via the epic fax function to make them aware. Will remove from preop pool.

## 2020-10-16 NOTE — Telephone Encounter (Signed)
   Primary Cardiologist: No primary care provider on file.  Chart reviewed as part of pre-operative protocol coverage.   Pre-op covering staff: - Please contact requesting surgeon's office via preferred method (i.e, phone, fax) to inform them that the patients warfarin is managed with Dr. Einar Gip. They will need to contact his office for clearance.     Kathyrn Drown, NP  10/16/2020, 4:43 PM

## 2020-10-17 ENCOUNTER — Other Ambulatory Visit (HOSPITAL_COMMUNITY)
Admission: RE | Admit: 2020-10-17 | Discharge: 2020-10-17 | Disposition: A | Discharge status: Home / Self Care | Payer: Medicare Other | Source: Ambulatory Visit | Attending: Vascular Surgery | Admitting: Vascular Surgery

## 2020-10-17 ENCOUNTER — Telehealth: Payer: Self-pay | Admitting: Gastroenterology

## 2020-10-17 DIAGNOSIS — D509 Iron deficiency anemia, unspecified: Secondary | ICD-10-CM | POA: Diagnosis not present

## 2020-10-17 DIAGNOSIS — Z01812 Encounter for preprocedural laboratory examination: Secondary | ICD-10-CM | POA: Insufficient documentation

## 2020-10-17 DIAGNOSIS — N2581 Secondary hyperparathyroidism of renal origin: Secondary | ICD-10-CM | POA: Diagnosis not present

## 2020-10-17 DIAGNOSIS — Z20822 Contact with and (suspected) exposure to covid-19: Secondary | ICD-10-CM | POA: Diagnosis not present

## 2020-10-17 DIAGNOSIS — D631 Anemia in chronic kidney disease: Secondary | ICD-10-CM | POA: Diagnosis not present

## 2020-10-17 DIAGNOSIS — N186 End stage renal disease: Secondary | ICD-10-CM | POA: Diagnosis not present

## 2020-10-17 DIAGNOSIS — Z992 Dependence on renal dialysis: Secondary | ICD-10-CM | POA: Diagnosis not present

## 2020-10-17 LAB — SARS CORONAVIRUS 2 (TAT 6-24 HRS): SARS Coronavirus 2: NEGATIVE

## 2020-10-17 NOTE — Telephone Encounter (Signed)
Pt is requesting a call back from a nurse since the dialysis center could not do the pt's labs since they will not cover them, pt would like to know what she can do.

## 2020-10-17 NOTE — Telephone Encounter (Signed)
Unable to leave message for pt. Pt will need to have labs done here at our Lab locating in the basement at her convenience.Will need to have labs completed before procedures.

## 2020-10-18 ENCOUNTER — Other Ambulatory Visit: Payer: Self-pay

## 2020-10-18 ENCOUNTER — Ambulatory Visit (HOSPITAL_COMMUNITY)
Admission: RE | Admit: 2020-10-18 | Discharge: 2020-10-18 | Disposition: A | Discharge status: Home / Self Care | Payer: Medicare Other | Attending: Vascular Surgery | Admitting: Vascular Surgery

## 2020-10-18 ENCOUNTER — Encounter: Payer: Self-pay | Admitting: Gastroenterology

## 2020-10-18 ENCOUNTER — Encounter (HOSPITAL_COMMUNITY)
Admission: RE | Disposition: A | Discharge status: Home / Self Care | Payer: Self-pay | Source: Home / Self Care | Attending: Vascular Surgery

## 2020-10-18 ENCOUNTER — Other Ambulatory Visit: Payer: Medicare Other

## 2020-10-18 DIAGNOSIS — Z882 Allergy status to sulfonamides status: Secondary | ICD-10-CM | POA: Diagnosis not present

## 2020-10-18 DIAGNOSIS — Z9049 Acquired absence of other specified parts of digestive tract: Secondary | ICD-10-CM | POA: Insufficient documentation

## 2020-10-18 DIAGNOSIS — G4730 Sleep apnea, unspecified: Secondary | ICD-10-CM | POA: Diagnosis not present

## 2020-10-18 DIAGNOSIS — Z79899 Other long term (current) drug therapy: Secondary | ICD-10-CM | POA: Insufficient documentation

## 2020-10-18 DIAGNOSIS — Z8249 Family history of ischemic heart disease and other diseases of the circulatory system: Secondary | ICD-10-CM | POA: Insufficient documentation

## 2020-10-18 DIAGNOSIS — I4891 Unspecified atrial fibrillation: Secondary | ICD-10-CM | POA: Diagnosis not present

## 2020-10-18 DIAGNOSIS — N186 End stage renal disease: Secondary | ICD-10-CM | POA: Insufficient documentation

## 2020-10-18 DIAGNOSIS — Y832 Surgical operation with anastomosis, bypass or graft as the cause of abnormal reaction of the patient, or of later complication, without mention of misadventure at the time of the procedure: Secondary | ICD-10-CM | POA: Diagnosis not present

## 2020-10-18 DIAGNOSIS — Z86010 Personal history of colonic polyps: Secondary | ICD-10-CM | POA: Insufficient documentation

## 2020-10-18 DIAGNOSIS — I120 Hypertensive chronic kidney disease with stage 5 chronic kidney disease or end stage renal disease: Secondary | ICD-10-CM | POA: Insufficient documentation

## 2020-10-18 DIAGNOSIS — Z880 Allergy status to penicillin: Secondary | ICD-10-CM | POA: Insufficient documentation

## 2020-10-18 DIAGNOSIS — K31A0 Gastric intestinal metaplasia, unspecified: Secondary | ICD-10-CM | POA: Insufficient documentation

## 2020-10-18 DIAGNOSIS — R1013 Epigastric pain: Secondary | ICD-10-CM | POA: Insufficient documentation

## 2020-10-18 DIAGNOSIS — T82838A Hemorrhage of vascular prosthetic devices, implants and grafts, initial encounter: Secondary | ICD-10-CM | POA: Diagnosis not present

## 2020-10-18 DIAGNOSIS — Z992 Dependence on renal dialysis: Secondary | ICD-10-CM | POA: Diagnosis not present

## 2020-10-18 DIAGNOSIS — Z91040 Latex allergy status: Secondary | ICD-10-CM | POA: Diagnosis not present

## 2020-10-18 DIAGNOSIS — Z7901 Long term (current) use of anticoagulants: Secondary | ICD-10-CM | POA: Insufficient documentation

## 2020-10-18 DIAGNOSIS — K5909 Other constipation: Secondary | ICD-10-CM | POA: Insufficient documentation

## 2020-10-18 DIAGNOSIS — T82898A Other specified complication of vascular prosthetic devices, implants and grafts, initial encounter: Secondary | ICD-10-CM | POA: Diagnosis not present

## 2020-10-18 HISTORY — PX: A/V FISTULAGRAM: CATH118298

## 2020-10-18 LAB — POCT I-STAT, CHEM 8
BUN: 22 mg/dL (ref 8–23)
Calcium, Ion: 1.16 mmol/L (ref 1.15–1.40)
Chloride: 101 mmol/L (ref 98–111)
Creatinine, Ser: 6.6 mg/dL — ABNORMAL HIGH (ref 0.44–1.00)
Glucose, Bld: 98 mg/dL (ref 70–99)
HCT: 40 % (ref 36.0–46.0)
Hemoglobin: 13.6 g/dL (ref 12.0–15.0)
Potassium: 3.9 mmol/L (ref 3.5–5.1)
Sodium: 141 mmol/L (ref 135–145)
TCO2: 30 mmol/L (ref 22–32)

## 2020-10-18 LAB — PROTIME-INR
INR: 2.3 — ABNORMAL HIGH (ref 0.8–1.2)
Prothrombin Time: 24.1 s — ABNORMAL HIGH (ref 11.4–15.2)

## 2020-10-18 SURGERY — A/V FISTULAGRAM
Anesthesia: LOCAL | Laterality: Right

## 2020-10-18 MED ORDER — HEPARIN (PORCINE) IN NACL 1000-0.9 UT/500ML-% IV SOLN
INTRAVENOUS | Status: DC | PRN
  Administered 2020-10-18: 500 mL

## 2020-10-18 MED ORDER — LIDOCAINE HCL (PF) 1 % IJ SOLN
INTRAMUSCULAR | Status: DC | PRN
  Administered 2020-10-18: 3 mL via INTRADERMAL

## 2020-10-18 MED ORDER — IODIXANOL 320 MG/ML IV SOLN
INTRAVENOUS | Status: DC | PRN
  Administered 2020-10-18: 25 mL via INTRAVENOUS

## 2020-10-18 SURGICAL SUPPLY — 8 items
COVER DOME SNAP 22 D (MISCELLANEOUS) ×2 IMPLANT
KIT MICROPUNCTURE NIT STIFF (SHEATH) ×2 IMPLANT
PROTECTION STATION PRESSURIZED (MISCELLANEOUS) ×2
SHEATH PROBE COVER 6X72 (BAG) ×2 IMPLANT
STATION PROTECTION PRESSURIZED (MISCELLANEOUS) ×1 IMPLANT
STOPCOCK MORSE 400PSI 3WAY (MISCELLANEOUS) ×2 IMPLANT
TRAY PV CATH (CUSTOM PROCEDURE TRAY) ×2 IMPLANT
TUBING CIL FLEX 10 FLL-RA (TUBING) ×2 IMPLANT

## 2020-10-18 NOTE — Discharge Instructions (Signed)
Dialysis Fistulogram, Care After The following information offers guidance on how to care for yourself after your procedure. Your health care provider may also give you more specific instructions. If you have problems or questions, contact your health care provider. What can I expect after the procedure? After the procedure, it is common to have:  A small amount of discomfort in the area where the small tube (catheter) was placed for the procedure.  A small amount of bruising around the fistula.  Sleepiness and tiredness (fatigue). Follow these instructions at home: Puncture site care  Follow instructions from your health care provider about how to take care of the site where catheters were inserted. Make sure you: ? Wash your hands with soap and water for at least 20 seconds before and after you change your bandage (dressing). If soap and water are not available, use hand sanitizer. ? Remove your dressing as told by your health care provider. In 24 hours ? Leave stitches (sutures), skin glue, or adhesive strips in place. These skin closures may need to stay in place for 2 weeks or longer. If adhesive strip edges start to loosen and curl up, you may trim the loose edges. Do not remove adhesive strips completely unless your health care provider tells you to do that.  Check your puncture area every day for signs of infection. Check for: ? More redness, swelling, or pain. ? Fluid or blood. ? Warmth. ? Pus or a bad smell.   Activity  Rest as much as you can.  If you were given a sedative during the procedure, it can affect you for several hours. Do not drive or operate machinery until your health care provider says that it is safe.  Do not lift anything that is heavier than 5 lb (2.3 kg), or the limit that you are told, on the day of your procedure.  Do not do anything strenuous with your arm for the rest of the day. Avoid household activities, such as vacuuming.  Return to your normal  activities as told by your health care provider. Ask your health care provider what activities are safe for you. Safety To prevent damage to your graft or fistula:  Do not wear tight-fitting clothing or jewelry on the arm or leg that has your graft or fistula.  Tell all your health care providers that you have a dialysis fistula or graft.  Do not allow blood draws, IVs, or blood pressure readings to be done in the arm that has your fistula or graft.  Do not allow flu shots or vaccinations in the arm with your fistula or graft. General instructions  Take over-the-counter and prescription medicines only as told by your health care provider.  Do not take baths, swim, or use a hot tub until your health care provider approves. Ask your health care provider if you may take showers. You may only be allowed to take sponge baths.  Monitor your dialysis fistula closely. Check to make sure that you can feel a vibration or buzz (a thrill) when you put your fingers over the fistula.  Keep all follow-up visits. This is important. Contact a health care provider if:  You have more redness, swelling, or pain at the site where the catheter was put in.  You have fluid or blood coming from the catheter site.  You have pus or a bad smell coming from the catheter site.  Your catheter site feels warm.  You have a fever or chills. Get help right  away if:  You have bleeding from the vascular access site that does not stop.  You feel weak.  You have trouble balancing.  You have trouble moving your arms or legs.  You have problems with your speech or vision.  You can no longer feel a vibration or buzz when you put your fingers over your fistula.  The limb that was used for the procedure swells or becomes painful, cold, blue, or pale white.  You have chest pain or shortness of breath. These symptoms may represent a serious problem that is an emergency. Do not wait to see if the symptoms will go  away. Get medical help right away. Call your local emergency services (911 in the [place] Do not drive yourself to the hospital. Summary  After a dialysis fistulogram, it is common to have a small amount of discomfort or bruising in the area where the small, thin tube (catheter) was placed.  Rest as much as you can after your procedure. Return to your normal activities as told by your health care provider.  Take over-the-counter and prescription medicines only as told by your health care provider.  Follow instructions from your health care provider about how to take care of the site where the catheter was inserted.  Keep all follow-up visits. This is important. This information is not intended to replace advice given to you by your health care provider. Make sure you discuss any questions you have with your health care provider. Document Revised: 02/15/2020 Document Reviewed: 02/15/2020 Elsevier Patient Education  [place].

## 2020-10-18 NOTE — H&P (Signed)
History and Physical Interval Note:  10/18/2020 10:04 AM  Jane [name]  has presented today for surgery, with the diagnosis of esrd.  The various methods of treatment have been discussed with the patient and family. After consideration of risks, benefits and other options for treatment, the patient has consented to  Procedure(s): A/V FISTULAGRAM (Right) as a surgical intervention.  The patient's history has been reviewed, patient examined, no change in status, stable for surgery.  I have reviewed the patient's chart and labs.  Questions were answered to the patient's satisfaction.    Right arm fistulogram.  Marty Heck  [name], female   DOB: Mar 02, 1946, 75 y.o.   MRN: [id number]  Reason for Consult: Follow-up   Referred by [name], [name], MD  Subjective:    Subjective [] Expand by Default   HPI:  Jane [name] is a 75 y.o. female with end-stage renal disease has recently had bleeding from her fistula.  She has also had a proximal 40 pound weight loss over the past 4 months.  She is here today with CT angio of her abdomen pelvis to evaluate for mesenteric ischemia and to discuss her fistula.  At last visit her INR was supratherapeutic.  She now states that her INR is within the normal range however she continues to have bleeding most recently today on dialysis she bled for 40 minutes.  As it pertains to her eating she states that she does not feel well particularly after eating protein she usually has to eat something sweet or sour to help her not vomit.  She does not have any pain with eating.  She is having normal bowel function.  She states that she does have stents in her upper arm on the right      Past Medical History:  Diagnosis Date  . Acid reflux   . Anemia of chronic disease   . Arthritis   . Asthma   . Atrial fibrillation ([place])   . Bilateral carotid bruits   . Complication of anesthesia    "hard to wake up, I have sleep apnea" no CPAP  .  Depression   . Diverticulitis   . Duodenal ulcer   . Dysrhythmia    Afib  . ESRD (end stage renal disease) ([place])    MWF [facility]  . Headache   . History of blood transfusion   . Hypertension   . Malaise and fatigue   . Orthostatic hypotension   . Shortness of breath    " when I walk to fast"  . Sleep apnea   . Syncope   . Tubulovillous adenoma of colon         Family History  Problem Relation Age of Onset  . Heart failure Mother   . Stroke Mother   . Other Father   . Colon cancer Neg Hx   . Liver disease Neg Hx   . Esophageal cancer Neg Hx   . Stomach cancer Neg Hx   . Inflammatory bowel disease Neg Hx   . Rectal cancer Neg Hx   . Pancreatic cancer Neg Hx         Past Surgical History:  Procedure Laterality Date  . AV FISTULA PLACEMENT    . BACK SURGERY     Lumbar fusion L 4 and L 5  . BIOPSY  01/09/2020   Procedure: BIOPSY;  Surgeon: Rush Landmark Telford Nab., MD;  Location: [place];  Service: Gastroenterology;;  . COLONOSCOPY    .  ENDOSCOPIC MUCOSAL RESECTION N/A 01/09/2020   Procedure: ENDOSCOPIC MUCOSAL RESECTION;  Surgeon: Rush Landmark Telford Nab., MD;  Location: [place];  Service: Gastroenterology;  Laterality: N/A;  . ESOPHAGOGASTRODUODENOSCOPY    . ESOPHAGOGASTRODUODENOSCOPY (EGD) WITH PROPOFOL N/A 01/09/2020   Procedure: ESOPHAGOGASTRODUODENOSCOPY (EGD) WITH PROPOFOL;  Surgeon: Rush Landmark Telford Nab., MD;  Location: [place];  Service: Gastroenterology;  Laterality: N/A;  . ESOPHAGOGASTRODUODENOSCOPY (EGD) WITH PROPOFOL N/A 01/13/2020   Procedure: ESOPHAGOGASTRODUODENOSCOPY (EGD) WITH PROPOFOL;  Surgeon: Irene Shipper, MD;  Location: [place];  Service: Gastroenterology;  Laterality: N/A;  . EUS N/A 01/09/2020   Procedure: UPPER ENDOSCOPIC ULTRASOUND (EUS) RADIAL;  Surgeon: Rush Landmark Telford Nab., MD;  Location: [place];  Service: Gastroenterology;  Laterality: N/A;  . HEMOSTASIS CLIP  PLACEMENT  01/09/2020   Procedure: HEMOSTASIS CLIP PLACEMENT;  Surgeon: Irving Copas., MD;  Location: [place];  Service: Gastroenterology;;  . HEMOSTASIS CONTROL  01/13/2020   Procedure: HEMOSTASIS CONTROL;  Surgeon: Irene Shipper, MD;  Location: [facility] ENDOSCOPY;  Service: Gastroenterology;;  Vassie Loll  . HOT HEMOSTASIS N/A 01/13/2020   Procedure: HOT HEMOSTASIS (ARGON PLASMA COAGULATION/BICAP);  Surgeon: Irene Shipper, MD;  Location: [place];  Service: Gastroenterology;  Laterality: N/A;  . IR GENERIC HISTORICAL  07/09/2016   IR US GUIDE VASC ACCESS RIGHT 07/09/2016 Arne Cleveland, MD [facility]  . IR GENERIC HISTORICAL  07/09/2016   IR FLUORO GUIDE CV LINE RIGHT 07/09/2016 Arne Cleveland, MD [facility]  . KNEE ARTHROPLASTY Left   . LAPAROSCOPIC SIGMOID COLECTOMY N/A 07/11/2016   Procedure: LAPAROSCOPIC SIGMOID COLECTOMY;  Surgeon: Clovis Riley, MD;  Location: [place];  Service: General;  Laterality: N/A;  . MASS EXCISION Right 04/10/2020   Procedure: EXCISION SKIN NODULE RIGHT FOREARM;  Surgeon: Waynetta Sandy, MD;  Location: [place];  Service: Vascular;  Laterality: Right;  . SCLEROTHERAPY  01/09/2020   Procedure: Clide Deutscher;  Surgeon: Mansouraty, Telford Nab., MD;  Location: [place];  Service: Gastroenterology;;  . Clide Deutscher  01/13/2020   Procedure: Clide Deutscher;  Surgeon: Irene Shipper, MD;  Location: [facility] ENDOSCOPY;  Service: Gastroenterology;;  . Maryagnes Amos INJECTION  01/09/2020   Procedure: SUBMUCOSAL LIFTING INJECTION;  Surgeon: Irving Copas., MD;  Location: [place];  Service: Gastroenterology;;  . TUBAL LIGATION      Short Social History:  Social History       Tobacco Use  . Smoking status: Never Smoker  . Smokeless tobacco: Never Used  Substance Use Topics  . Alcohol use: No         Allergies  Allergen Reactions  . Latex Rash  . Penicillins Other (See Comments)    Yeast infection  / Childhood  . Sulfa Antibiotics Rash  . Tape Other (See Comments)    Plastic, silicone, and paper tape causes bruising and pulls off skin. Cloth tape works fine          Current Outpatient Medications  Medication Sig Dispense Refill  . acetaminophen (TYLENOL) 500 MG tablet Take 1,500 mg by mouth daily as needed for headache.    . albuterol (PROVENTIL HFA;VENTOLIN HFA) 108 (90 Base) MCG/ACT inhaler Inhale 1 puff into the lungs every 6 (six) hours as needed for wheezing or shortness of breath. (Patient taking differently: Inhale 2 puffs into the lungs every 6 (six) hours as needed for wheezing or shortness of breath.) 6.7 g 2  . amLODipine (NORVASC) 5 MG tablet Take 5 mg by mouth daily.    . B Complex-C-Zn-Folic Acid (DIALYVITE 267 WITH ZINC) 0.8  MG TABS Take 1 tablet by mouth daily.    . carvedilol (COREG) 12.5 MG tablet TAKE 1 TABLET BY MOUTH TWICE DAILY WITH A MEAL (Patient taking differently: Take 25 mg by mouth 2 (two) times daily with a meal.) 60 tablet 0  . DULoxetine (CYMBALTA) 20 MG capsule Take 2 capsules by mouth once daily 60 capsule 3  . ethyl chloride spray Apply 1 application topically daily as needed (port access).   12  . folic acid-vitamin b complex-vitamin c-selenium-zinc (DIALYVITE) 3 MG TABS tablet Take 1 tablet by mouth daily.    . hydrALAZINE (APRESOLINE) 25 MG tablet Take 1 tablet (25 mg total) by mouth 3 (three) times daily. 270 tablet 2  . HYDROcodone-acetaminophen (NORCO/VICODIN) 5-325 MG tablet Take 1 tablet by mouth daily as needed for moderate pain. 30 tablet 0  . isosorbide dinitrate (ISORDIL) 30 MG tablet Take 1 tablet (30 mg total) by mouth 3 (three) times daily. 270 tablet 2  . linaclotide (LINZESS) 145 MCG CAPS capsule Take 1 capsule (145 mcg total) by mouth daily before breakfast. 30 capsule 3  . Methoxy PEG-Epoetin Beta (MIRCERA IJ) Mircera    . omeprazole (PRILOSEC) 40 MG capsule TAKE 1 CAPSULE BY MOUTH TWICE DAILY BEFORE A MEAL 60 capsule 0   . sevelamer carbonate (RENVELA) 800 MG tablet Take 1,600 mg by mouth in the morning and at bedtime. Taking with meals    . warfarin (COUMADIN) 5 MG tablet As directed by coumadin clinic. (Patient taking differently: Take 5 mg by mouth See admin instructions. 2.5 mg every Mon, Wed, Fri; 5 mg all other days, or as directed by coumadin clinic.) 113 tablet 2   No current facility-administered medications for this visit.    Review of Systems  Constitutional: Positive for unexpected weight change.  HENT: HENT negative.  Eyes: Eyes negative.  Cardiovascular: Cardiovascular negative.  GI: Gastrointestinal negative.  Musculoskeletal: Musculoskeletal negative.  Skin: Skin negative.  Neurological: Neurological negative. Hematologic: Positive for bruises/bleeds easily.        Objective:   Objective       Vitals:   10/12/20 1416  BP: (!) 156/74  Pulse: 71  Resp: 20  Temp: 98.3 F (36.8 C)  SpO2: 93%    Physical Exam HENT:     Head: Normocephalic.     Nose:     Comments: Wearing a mask Eyes:     Pupils: Pupils are equal, round, and reactive to light.  Cardiovascular:     Rate and Rhythm: Normal rate.  Pulmonary:     Effort: Pulmonary effort is normal.  Abdominal:     Palpations: Abdomen is soft.  Musculoskeletal:        General: Normal range of motion.     Cervical back: Neck supple.     Right lower leg: No edema.     Comments: Right forearm fistula has palpable thrill bandage in place from dialysis today  Skin:    General: Skin is warm and dry.     Capillary Refill: Capillary refill takes less than 2 seconds.  Neurological:     General: No focal deficit present.     Mental Status: She is alert.  Psychiatric:        Mood and Affect: Mood normal.        Behavior: Behavior normal.        Thought Content: Thought content normal.        Judgment: Judgment normal.     Data: CTA ABDOMEN AND PELVIS WITH  CONTRAST  TECHNIQUE: Multidetector CT imaging  of the abdomen and pelvis was performed using the standard protocol during bolus administration of intravenous contrast. Multiplanar reconstructed images and MIPs were obtained and reviewed to evaluate the vascular anatomy.  CONTRAST: 108mL ISOVUE-370 IOPAMIDOL (ISOVUE-370) INJECTION 76%  COMPARISON: 10/06/2019 and previous  FINDINGS: VASCULAR  Aorta: Moderate calcified atheromatous plaque. No aneurysm, dissection, or stenosis.  Celiac: Calcified ostial plaque resulting in short segment in stenosis of at least mild severity, patent distally.  SMA: Partially calcified ostial plaque resulting in short segment stenosis of doubtful hemodynamic significance, patent distally. Replaced right hepatic arterial supply, an anatomic variant.  Renals: Single left, with calcified proximal plaque, no stenosis. Single right, patent.  IMA: Patent without evidence of aneurysm, dissection, vasculitis or significant stenosis.  Inflow: Scattered calcified plaque throughout the iliac arterial system. Mild tortuosity. No aneurysm, dissection, or high-grade stenosis.  Proximal Outflow: Atheromatous, patent.  Veins: Patent hepatic veins, portal vein, SM V, bilateral renal veins. No venous pathology evident.  Review of the MIP images confirms the above findings.  NON-VASCULAR  Lower chest: No acute abnormality.  Hepatobiliary: No liver lesion or biliary ductal dilatation. Distended gallbladder.  Pancreas: Unremarkable. No pancreatic ductal dilatation or surrounding inflammatory changes.  Spleen: Normal in size without focal abnormality.  Adrenals/Urinary Tract: Adrenal glands unremarkable. Extensive bilateral renal cystic changes of dialysis. No hydronephrosis. Urinary bladder decompressed.  Stomach/Bowel: Stomach and small bowel decompressed. Appendix not discretely identified. No pericecal inflammatory change. The colon is decompressed. Anastomotic staple line in  the distal sigmoid segment. No acute findings.  Lymphatic: No abdominal or pelvic adenopathy.  Reproductive: Calcified uterine fibroids. Bilateral adnexa are unremarkable.  Other: Trace pelvic ascites. No free air.  Musculoskeletal: Small paraumbilical hernia containing only mesenteric fat. Mild lumbar dextroscoliosis apex L3-4. Instrumented PLIF L4-5. Advanced adjacent-level degenerative disc disease L3-4 and L5-S1. No fracture or worrisome bone lesion.  IMPRESSION: 1. No severe proximal mesenteric arterial occlusive disease to suggest an etiology of occlusive mesenteric ischemia.      Assessment/Plan:   Assessment    75 year old female with end-stage renal disease and persistent bleeding on dialysis concern for proximal obstructive process with known stents in her right upper arm.  From the standpoint we have plan for right upper extremity fistulogram on a nondialysis day in the near future.  I discussed with her that we will hold Coumadin 72 hours prior to procedure and that this will likely be performed by one of my partners given that she dialyzes on Mondays, Wednesdays and Fridays.  CTA reviewed with patient today without any mesenteric stenosis to suggest chronic mesenteric ischemia.     Waynetta Sandy MD [facility]

## 2020-10-18 NOTE — Telephone Encounter (Signed)
Pt informed to that she will need to have labs done here at our office. Pt voiced understanding.

## 2020-10-18 NOTE — Op Note (Signed)
    OPERATIVE NOTE   PROCEDURE: 1. right radiocephalic arteriovenous fistula cannulation under ultrasound guidance 2. right arm fistulogram including central venogram  PRE-OPERATIVE DIAGNOSIS: Bleeding from right arteriovenous fistula  POST-OPERATIVE DIAGNOSIS: same as above   SURGEON: Marty Heck, MD  ANESTHESIA: local  ESTIMATED BLOOD LOSS: 5 cc  FINDING(S): 1. There was a good thrill in the forearm where the radiocephalic fistula was accessed.  Ultimately this showed that the stents in the cephalic vein throughout the upper arm are occluded.  The forearm cephalic vein fistula is patent and drains into the median cubital vein and then into the deep system and has brisk flow in the upper arm through deep brachial veins into axillary vein with no central stenosis.  She states the fistula is working and she dialyzed on Wednesday.  I discussed I would not make any changes if the fistula is working and ultimately may require new dialysis access in the future if this fails.  The stents appear chronically occluded given collateralization in the forearm with adequate venous outflow in the upper arm.  SPECIMEN(S):  None  CONTRAST:25 mL  INDICATIONS: Jane [name] is a 75 y.o. female who  presents with malfunctioning right arteriovenous fistula.  The patient is scheduled for right arm fistulogram.  The patient is aware the risks include but are not limited to: bleeding, infection, thrombosis of the cannulated access, and possible anaphylactic reaction to the contrast.  The patient is aware of the risks of the procedure and elects to proceed forward.  DESCRIPTION: After full informed written consent was obtained, the patient was brought back to the angiography suite and placed supine upon the angiography table.  The patient was connected to monitoring equipment.  The right arm was prepped and draped in the standard fashion for a right arm fistulogram.  Under ultrasound guidance, the right  arteriovenous fistula was evaluated, it was patent, an imaged was saved.  It was cannulated with a micropuncture needle under US guidance.  The microwire was advanced into the fistula and the needle was exchanged for the a microsheath, which was lodged 2 cm into the access.  The wire was removed and the sheath was connected to the IV extension tubing.  Hand injections were completed to image the access from the antecubitum up to the level of axilla.  The central venous structures were also imaged by hand injections.  Based on the images, this patient will need: No intervention with pertinent findings noted above.  A 4-0 Monocryl purse-string suture was sewn around the sheath.  The sheath was removed while tying down the suture.  A sterile bandage was applied to the puncture site.  COMPLICATIONS: None  CONDITION: Stable  Marty Heck, MD [facility] Office: [place]   10/18/2020 12:20 PM

## 2020-10-18 NOTE — Progress Notes (Signed)
[place] VISIT   Primary Care Provider [name], [name], MD [place] [address] [phone]  Referring Provider Dr. Hilarie Fredrickson  Patient Profile Jane [name] is a 75 y.o. female with a pmh significant for atrial fibrillation (on Coumadin), end-stage renal disease on HD, hypertension, sleep apnea, diverticulosis with prior diverticulitis, GERD, prior colon polyps, TVA of the duodenum.  The patient presents to the [facility] for an evaluation and management of problem(s) noted below:  Problem List 1. Tubulovillous adenoma of small intestine   2. Adenomatous duodenal polyp   3. Gastric intestinal metaplasia   4. Dyspepsia   5. Chronic constipation     History of Present Illness Please see initial consultation note by Dr. Hilarie Fredrickson and my prior progress notes for full details of HPI.  Interval History Today, the patient returns for a follow-up visit to discuss potential repeat endoscopic evaluation/surveillance.  Since I had last seen the patient, patient needed to undergo a repeat endoscopy for bleeding with finding of active extravasation few days after my EMR.  This was treated endoscopically and she did well and was eventually able to be discharged.  Patient has been experiencing a postprandial abdominal pain over the course the last few months leading to a nearly 30 to 40 pound weight loss per the patient report.  Cross-sectional imaging was performed to rule out mesenteric ischemia.  She was evaluated by vascular surgery not felt to have evidence of mesenteric ischemia.  CT imaging did not show any evidence of recurrence of a mass or lesion in the small bowel.  Patient has some concerns about potential recurrence of bleeding but knows that any precancerous tissue that recurs means that she could end up needing surgery down the road and she would like to try minimize surgery if possible.  Patient has some occasional nausea but  is not vomiting.  GI Review of Systems Positive as above Negative for dysphagia, odynophagia, change in bowel habits  Review of Systems General: Denies fevers/chills Cardiovascular: Denies current chest pain Pulmonary: Denies shortness of breath Gastroenterological: See HPI Hematological: Positive for easy bruising/bleeding due to Coumadin Dermatological: Denies jaundice Psychological: Mood is stable   Medications Current Outpatient Medications  Medication Sig Dispense Refill  . acetaminophen (TYLENOL) 500 MG tablet Take 1,500 mg by mouth daily as needed for headache.    . albuterol (PROVENTIL HFA;VENTOLIN HFA) 108 (90 Base) MCG/ACT inhaler Inhale 1 puff into the lungs every 6 (six) hours as needed for wheezing or shortness of breath. (Patient taking differently: Inhale 2 puffs into the lungs every 6 (six) hours as needed for wheezing or shortness of breath.) 6.7 g 2  . amLODipine (NORVASC) 5 MG tablet Take 5 mg by mouth daily.    . B Complex-C-Zn-Folic Acid (DIALYVITE 482 WITH ZINC) 0.8 MG TABS Take 1 tablet by mouth daily.    . carvedilol (COREG) 12.5 MG tablet TAKE 1 TABLET BY MOUTH TWICE DAILY WITH A MEAL (Patient taking differently: Take 25 mg by mouth 2 (two) times daily with a meal.) 60 tablet 0  . DULoxetine (CYMBALTA) 20 MG capsule Take 2 capsules by mouth once daily (Patient taking differently: Take 20 mg by mouth at bedtime.) 60 capsule 3  . ethyl chloride spray Apply 1 application topically daily as needed (port access).   12  . hydrALAZINE (APRESOLINE) 25 MG tablet Take 1 tablet (25 mg total) by mouth 3 (three) times daily. 270 tablet 2  . HYDROcodone-acetaminophen (NORCO/VICODIN) 5-325 MG  tablet Take 1 tablet by mouth daily as needed for moderate pain. 30 tablet 0  . isosorbide dinitrate (ISORDIL) 30 MG tablet Take 1 tablet (30 mg total) by mouth 3 (three) times daily. 270 tablet 2  . linaclotide (LINZESS) 145 MCG CAPS capsule Take 1 capsule (145 mcg total) by mouth daily  before breakfast. 30 capsule 3  . Methoxy PEG-Epoetin Beta (MIRCERA IJ) Mircera    . omeprazole (PRILOSEC) 40 MG capsule TAKE 1 CAPSULE BY MOUTH TWICE DAILY BEFORE A MEAL (Patient taking differently: Take 40 mg by mouth 2 (two) times daily.) 60 capsule 0  . ondansetron (ZOFRAN) 4 MG tablet Take 4 mg by mouth every 8 (eight) hours as needed for nausea or vomiting.    . sucralfate (CARAFATE) 1 GM/10ML suspension Take 10 mLs (1 g total) by mouth 2 (two) times daily. 420 mL 1  . warfarin (COUMADIN) 5 MG tablet As directed by coumadin clinic. (Patient taking differently: Take 5-7.5 mg by mouth See admin instructions. 7.5 mg every Mon, Wed, Fri; 5 mg all other days, or as directed by coumadin clinic.) 113 tablet 2   Current Facility-Administered Medications  Medication Dose Route Frequency Provider Last Rate Last Admin  . 0.9 %  sodium chloride infusion  250 mL Intravenous PRN Marty Heck, MD      . sodium chloride flush (NS) 0.9 % injection 3 mL  3 mL Intravenous Q12H Marty Heck, MD      . sodium chloride flush (NS) 0.9 % injection 3 mL  3 mL Intravenous PRN Marty Heck, MD        Allergies Allergies  Allergen Reactions  . Latex Rash  . Penicillins Other (See Comments)    Yeast infection / Childhood  . Sulfa Antibiotics Rash  . Tape Other (See Comments)    Plastic, silicone, and paper tape causes bruising and pulls off skin. Cloth tape works fine    Histories Past Medical History:  Diagnosis Date  . Acid reflux   . Anemia of chronic disease   . Arthritis   . Asthma   . Atrial fibrillation ([place])   . Bilateral carotid bruits   . Complication of anesthesia    "hard to wake up, I have sleep apnea" no CPAP  . Depression   . Diverticulitis   . Duodenal ulcer   . Dysrhythmia    Afib  . ESRD (end stage renal disease) ([place])    MWF [place]  . Headache   . History of blood transfusion   . Hypertension   . Malaise and fatigue   . Orthostatic  hypotension   . Shortness of breath    " when I walk to fast"  . Sleep apnea   . Syncope   . Tubulovillous adenoma of colon    Past Surgical History:  Procedure Laterality Date  . A/V FISTULAGRAM Right 10/18/2020   Procedure: A/V FISTULAGRAM;  Surgeon: Marty Heck, MD;  Location: [facility];  Service: Cardiovascular;  Laterality: Right;  . AV FISTULA PLACEMENT    . BACK SURGERY     Lumbar fusion L 4 and L 5  . BIOPSY  01/09/2020   Procedure: BIOPSY;  Surgeon: Rush Landmark Telford Nab., MD;  Location: [facility];  Service: Gastroenterology;;  . COLONOSCOPY    . ENDOSCOPIC MUCOSAL RESECTION N/A 01/09/2020   Procedure: ENDOSCOPIC MUCOSAL RESECTION;  Surgeon: Rush Landmark Telford Nab., MD;  Location: [facility];  Service: Gastroenterology;  Laterality: N/A;  . ESOPHAGOGASTRODUODENOSCOPY    .  ESOPHAGOGASTRODUODENOSCOPY (EGD) WITH PROPOFOL N/A 01/09/2020   Procedure: ESOPHAGOGASTRODUODENOSCOPY (EGD) WITH PROPOFOL;  Surgeon: Rush Landmark Telford Nab., MD;  Location: [facility];  Service: Gastroenterology;  Laterality: N/A;  . ESOPHAGOGASTRODUODENOSCOPY (EGD) WITH PROPOFOL N/A 01/13/2020   Procedure: ESOPHAGOGASTRODUODENOSCOPY (EGD) WITH PROPOFOL;  Surgeon: Irene Shipper, MD;  Location: [facility];  Service: Gastroenterology;  Laterality: N/A;  . EUS N/A 01/09/2020   Procedure: UPPER ENDOSCOPIC ULTRASOUND (EUS) RADIAL;  Surgeon: Rush Landmark Telford Nab., MD;  Location: [facility];  Service: Gastroenterology;  Laterality: N/A;  . HEMOSTASIS CLIP PLACEMENT  01/09/2020   Procedure: HEMOSTASIS CLIP PLACEMENT;  Surgeon: Irving Copas., MD;  Location: [place];  Service: Gastroenterology;;  . HEMOSTASIS CONTROL  01/13/2020   Procedure: HEMOSTASIS CONTROL;  Surgeon: Irene Shipper, MD;  Location: [facility];  Service: Gastroenterology;;  Vassie Loll  . HOT HEMOSTASIS N/A 01/13/2020   Procedure: HOT HEMOSTASIS (ARGON PLASMA COAGULATION/BICAP);  Surgeon: Irene Shipper, MD;   Location: [facility];  Service: Gastroenterology;  Laterality: N/A;  . IR GENERIC HISTORICAL  07/09/2016   IR US GUIDE VASC ACCESS RIGHT 07/09/2016 Arne Cleveland, MD [facility]  . IR GENERIC HISTORICAL  07/09/2016   IR FLUORO GUIDE CV LINE RIGHT 07/09/2016 Arne Cleveland, MD [facility]  . KNEE ARTHROPLASTY Left   . LAPAROSCOPIC SIGMOID COLECTOMY N/A 07/11/2016   Procedure: LAPAROSCOPIC SIGMOID COLECTOMY;  Surgeon: Clovis Riley, MD;  Location: [facility];  Service: General;  Laterality: N/A;  . MASS EXCISION Right 04/10/2020   Procedure: EXCISION SKIN NODULE RIGHT FOREARM;  Surgeon: Waynetta Sandy, MD;  Location: [facility];  Service: Vascular;  Laterality: Right;  . SCLEROTHERAPY  01/09/2020   Procedure: Clide Deutscher;  Surgeon: Mansouraty, Telford Nab., MD;  Location: [facility];  Service: Gastroenterology;;  . Clide Deutscher  01/13/2020   Procedure: Clide Deutscher;  Surgeon: Irene Shipper, MD;  Location: [facility];  Service: Gastroenterology;;  . Maryagnes Amos INJECTION  01/09/2020   Procedure: SUBMUCOSAL LIFTING INJECTION;  Surgeon: Irving Copas., MD;  Location: [facility];  Service: Gastroenterology;;  . TUBAL LIGATION     Social History   Socioeconomic History  . Marital status: Divorced    Spouse name: Not on file  . Number of children: 4  . Years of education: 104  . Highest education level: Associate degree: occupational, Hotel manager, or vocational program  Occupational History  . Occupation: Retired  Tobacco Use  . Smoking status: Never Smoker  . Smokeless tobacco: Never Used  Vaping Use  . Vaping Use: Never used  Substance and Sexual Activity  . Alcohol use: No  . Drug use: No  . Sexual activity: Never  Other Topics Concern  . Not on file  Social History Narrative   HH 1   Divorced   Outpatient dialysis Mon, Wed, Fri   4 children: 1 daughter locally is an Designer, multimedia and 3 sons in [place] Determinants of Health   Financial  Resource Strain: [place]   . Difficulty of Paying Living Expenses: Not hard at all  Food Insecurity: No Food Insecurity  . Worried About Charity fundraiser in the Last Year: Never true  . Ran Out of Food in the Last Year: Never true  Transportation Needs: No Transportation Needs  . Lack of Transportation (Medical): No  . Lack of Transportation (Non-Medical): No  Physical Activity: Inactive  . Days of Exercise per Week: 0 days  . Minutes of Exercise per Session: 0 min  Stress: Stress Concern Present  .  Feeling of Stress : To some extent  Social Connections: Moderately Isolated  . Frequency of Communication with Friends and Family: More than three times a week  . Frequency of Social Gatherings with Friends and Family: Never  . Attends Religious Services: More than 4 times per year  . Active Member of Clubs or Organizations: No  . Attends Archivist Meetings: Never  . Marital Status: Divorced  Human resources officer Violence: Not At Risk  . Fear of Current or Ex-Partner: No  . Emotionally Abused: No  . Physically Abused: No  . Sexually Abused: No   Family History  Problem Relation Age of Onset  . Heart failure Mother   . Stroke Mother   . Other Father   . Colon cancer Neg Hx   . Liver disease Neg Hx   . Esophageal cancer Neg Hx   . Stomach cancer Neg Hx   . Inflammatory bowel disease Neg Hx   . Rectal cancer Neg Hx   . Pancreatic cancer Neg Hx    I have reviewed her medical, social, and family history in detail and updated the electronic medical record as necessary.    PHYSICAL EXAMINATION  BP (!) 160/60 (BP Location: Left Arm, Patient Position: Sitting, Cuff Size: Large)   Pulse 64 Comment: irregular  Ht _0  (1.626 m) Comment: height measured without shoes  Wt 215 lb 2 oz (97.6 kg)   BMI 36.93 kg/m  Wt Readings from Last 3 Encounters:  10/18/20 215 lb (97.5 kg)  10/16/20 215 lb 2 oz (97.6 kg)  10/12/20 216 lb (98 kg)  GEN: NAD, appears stated age, doesn't  appear chronically ill PSYCH: Cooperative, without pressured speech EYE: Conjunctivae pink, sclerae anicteric ENT: Masked CV: Nontachycardic RESP: No audible wheezing GI: NABS, soft, protuberant abdomen, tenderness to palpation in midepigastrium, no rebound or guarding MSK/EXT: Bilateral lower extremity edema present SKIN: No jaundice EURO:  Alert & Oriented x 3, no focal deficits   REVIEW OF DATA  I reviewed the following data at the time of this encounter:  GI Procedures and Studies  June 2021 EGD/EUS - No gross lesions in esophagus. Z-line regular, 39 cm from the incisors. - Gastritis with hemorrhage. Biopsied. - One gastric polyp. Resected and retrieved. Clips (MR conditional) were placed. - Normal major papilla and minor papilla. - A single large duodenal polyp. Resected via mucosal resection in piecemeal fashion and retrieved. Clips (MR conditional) were placed to attempt closure of the defect but could not close the entirety of the defect. - Otherwise, no gross lesions in the entire examined duodenum. - Normal examined proximal jejunum. EUS Impression: - A lesion was found in the second portion of the duodenum. Tissue has not been obtained. However, the clinical appearance is consistent with an adenoma (previously biopsied as TVA). Pathology FINAL MICROSCOPIC DIAGNOSIS:  A. DUODENAL POLYP, EMR:  - Duodenal tubulovillous adenoma.  - No invasive carcinoma identified.  B. STOMACH POLYP, EMR:  - Gastric hyperplastic polyp with mild inflammation and edema.  - No adenomatous change or carcinoma.  C. STOMACH, RANDOM, BIOPSY:  - Mild chronic gastritis with intestinal metaplasia consistent with  chronic atrophic gastritis.  - Warthin-Starry negative for Helicobacter pylori.  - No dysplasia or carcinoma.   June 2021 EGD 1. Massive bleeding from post bulbar duodenum at prior EMR resection site as described above. Status post successful endoscopic hemostatic therapy. Please see  description above as well as images. 2. Small ulcer and hemoclips in stomach at polypectomy site (  hyperplastic polyp).  Laboratory Studies  Reviewed those in epic We will need to obtain labs through [facility]  Imaging Studies  March 2022 CT angio abdomen/pelvis IMPRESSION: 1. No severe proximal mesenteric arterial occlusive disease to suggest an etiology of occlusive mesenteric ischemia. Aortic Atherosclerosis (ICD10-I70.0).   ASSESSMENT  Ms. Lawhead is a 75 y.o. female with a pmh significant for atrial fibrillation (on Coumadin), end-stage renal disease on HD, hypertension, sleep apnea, diverticulosis with prior diverticulitis, GERD, prior colon polyps, TVA of the duodenum.  The patient is seen today for evaluation and management of:  1. Tubulovillous adenoma of small intestine   2. Adenomatous duodenal polyp   3. Gastric intestinal metaplasia   4. Dyspepsia   5. Chronic constipation    The patient is hemodynamically stable.  The etiology of the patient's clinical issues from a pain perspective are not clearly defined.  We will do some laboratory evaluation to further evaluate and try and understand symptoms.  Cross-sectional imaging was not suggestive of mesenteric ischemia.  If there was significant issues as a result of the previous TVA that was resected having recurred or causing obstructive symptoms we should see findings on imaging and none of that is found.  Next steps would also be for consideration of endoscopic evaluation.  As such, as she is due for surveillance, I think it is reasonable to approach this.  We did discuss the potential risk of recurrence being higher in this particular lesion but we would not know unless we relook.  We discussed some of the techniques of follow-up resection if recurrence occurs such as an endoscopic mucosal resection redo, OVESCO full-thickness resection (we do not have this available at this time however), Endorotor morcellation, and tissue  ablation via fulguration.  The risks and benefits of endoscopic evaluation were discussed with the patient; these include but are not limited to the risk of perforation, infection, bleeding, missed lesions, lack of diagnosis, severe illness requiring hospitalization, as well as anesthesia and sedation related illnesses.  During attempts at advanced resection, the risks of bleeding and perforation/leak are increased as opposed to diagnostic and screening procedures, and that was discussed with the patient as well.   We will obtain approval for a hold of anticoagulation as per prior endoscopy.  All patient questions were answered to the best of my ability, and the patient agrees to the aforementioned plan of action with follow-up as indicated.   PLAN  Preprocedure labs of a CBC/BMP will be obtained at patient's hemodialysis center at least 1 to 2 weeks before Move forward with scheduled follow-up EGD/EMR  If recurrence is not felt to be endoscopically amenable to treatment, surgical resection may be needed Laboratories as outlined below to be obtained to further evaluate abdominal pain that is postprandial Follow-up with Dr. Hilarie Fredrickson for further work-up/evaluation We will trial addition of Carafate twice daily to see if any effect in regards to her symptoms of postprandial abdominal pain Consider functional dyspepsia as well (though that should not cause weight loss unintentionally)   Orders Placed This Encounter  Procedures  . Procedural/ Surgical Case Request: ESOPHAGOGASTRODUODENOSCOPY (EGD) WITH PROPOFOL, ENDOSCOPIC MUCOSAL RESECTION  . CBC  . Comp Met (CMET)  . TSH  . Cortisol  . Alpha galactosidase  . Ambulatory referral to Gastroenterology    New Prescriptions   SUCRALFATE (CARAFATE) 1 GM/10ML SUSPENSION    Take 10 mLs (1 g total) by mouth 2 (two) times daily.   Modified Medications   No medications on file  Planned Follow Up No follow-ups on file.   Total Time in Face-to-Face  and in Coordination of Care for patient including independent/personal interpretation/review of prior testing, medical history, examination, medication adjustment, communicating results with the patient directly, and documentation with the EHR is 25 minutes.   Justice Britain, MD [facility] Office # [phone]

## 2020-10-19 ENCOUNTER — Ambulatory Visit: Payer: Medicare Other | Admitting: Family Medicine

## 2020-10-19 DIAGNOSIS — I158 Other secondary hypertension: Secondary | ICD-10-CM | POA: Diagnosis not present

## 2020-10-19 DIAGNOSIS — D509 Iron deficiency anemia, unspecified: Secondary | ICD-10-CM | POA: Diagnosis not present

## 2020-10-19 DIAGNOSIS — N2581 Secondary hyperparathyroidism of renal origin: Secondary | ICD-10-CM | POA: Diagnosis not present

## 2020-10-19 DIAGNOSIS — D631 Anemia in chronic kidney disease: Secondary | ICD-10-CM | POA: Diagnosis not present

## 2020-10-19 DIAGNOSIS — N186 End stage renal disease: Secondary | ICD-10-CM | POA: Diagnosis not present

## 2020-10-19 DIAGNOSIS — Z992 Dependence on renal dialysis: Secondary | ICD-10-CM | POA: Diagnosis not present

## 2020-10-19 NOTE — Telephone Encounter (Signed)
OV scheduled with Jane [name] on 10/30/20. Will reinforce hold directions for EGD with pt at upcoming INR check on 10/25/20

## 2020-10-19 NOTE — Telephone Encounter (Signed)
Dr. Einar Gip please see request below. Asking for clearance to hold Coumadin x5 days prior to [facility] on 11/29/20.   Thank you  Jaylee Freeze

## 2020-10-19 NOTE — Telephone Encounter (Signed)
Pt has been informed okay to hold Coumadin x5 days prior to procedure. Pt voiced understanding. Pt knows to expect a call from Dr. Einar Gip office regarding follow-up for INR check.

## 2020-10-20 DIAGNOSIS — I480 Paroxysmal atrial fibrillation: Secondary | ICD-10-CM | POA: Diagnosis not present

## 2020-10-20 DIAGNOSIS — I10 Essential (primary) hypertension: Secondary | ICD-10-CM | POA: Diagnosis not present

## 2020-10-22 DIAGNOSIS — Z992 Dependence on renal dialysis: Secondary | ICD-10-CM | POA: Diagnosis not present

## 2020-10-22 DIAGNOSIS — N186 End stage renal disease: Secondary | ICD-10-CM | POA: Diagnosis not present

## 2020-10-22 DIAGNOSIS — D631 Anemia in chronic kidney disease: Secondary | ICD-10-CM | POA: Diagnosis not present

## 2020-10-22 DIAGNOSIS — D509 Iron deficiency anemia, unspecified: Secondary | ICD-10-CM | POA: Diagnosis not present

## 2020-10-22 DIAGNOSIS — N2581 Secondary hyperparathyroidism of renal origin: Secondary | ICD-10-CM | POA: Diagnosis not present

## 2020-10-23 ENCOUNTER — Other Ambulatory Visit: Payer: Self-pay

## 2020-10-23 ENCOUNTER — Ambulatory Visit (INDEPENDENT_AMBULATORY_CARE_PROVIDER_SITE_OTHER): Payer: Medicare Other | Admitting: Family Medicine

## 2020-10-23 ENCOUNTER — Encounter: Payer: Self-pay | Admitting: Family Medicine

## 2020-10-23 VITALS — BP 110/70 | HR 77 | Resp 16 | Ht 64.0 in | Wt 217.1 lb

## 2020-10-23 DIAGNOSIS — I480 Paroxysmal atrial fibrillation: Secondary | ICD-10-CM

## 2020-10-23 DIAGNOSIS — R110 Nausea: Secondary | ICD-10-CM | POA: Diagnosis not present

## 2020-10-23 DIAGNOSIS — G894 Chronic pain syndrome: Secondary | ICD-10-CM | POA: Diagnosis not present

## 2020-10-23 DIAGNOSIS — M159 Polyosteoarthritis, unspecified: Secondary | ICD-10-CM

## 2020-10-23 DIAGNOSIS — I129 Hypertensive chronic kidney disease with stage 1 through stage 4 chronic kidney disease, or unspecified chronic kidney disease: Secondary | ICD-10-CM | POA: Diagnosis not present

## 2020-10-23 DIAGNOSIS — F320 Major depressive disorder, single episode, mild: Secondary | ICD-10-CM

## 2020-10-23 MED ORDER — HYDROCODONE-ACETAMINOPHEN 5-325 MG PO TABS: 1.0000 | ORAL_TABLET | Freq: Two times a day (BID) | ORAL | 0 refills | Status: DC | PRN

## 2020-10-23 MED ORDER — DULOXETINE HCL 20 MG PO CPEP
40.0000 mg | ORAL_CAPSULE | Freq: Every day | ORAL | 2 refills | Status: DC
Start: 2020-10-23 — End: 2021-07-18

## 2020-10-23 NOTE — Assessment & Plan Note (Addendum)
We discussed guidelines and current recommendations in regard to pain management and chronic opioid use. She prefers to hold on referral and will let me know in about 2 weeks if adjusting Hydrocodone-Acetaminophen helps. We are also holding on signing med contract.

## 2020-10-23 NOTE — Patient Instructions (Addendum)
A few things to remember from today's visit:   Chronic pain disorder - Plan: HYDROcodone-acetaminophen (NORCO/VICODIN) 5-325 MG tablet  Hypertension with renal disease  Generalized osteoarthritis of multiple sites - Plan: DULoxetine (CYMBALTA) 20 MG capsule, HYDROcodone-acetaminophen (NORCO/VICODIN) 5-325 MG tablet  Depression, major, single episode, mild ([place])  If you need refills please call your pharmacy. Do not use My Chart to request refills or for acute issues that need immediate attention.   Hydrocodone-Acetaminophen increased to 2 tabs daily. You can take Tylenol 500 mg up to 2 tabs if needed in between. Let me know if you still would like to go to pain management. Fall precautions.  Continue Duloxetine 40 mg daily.  Please be sure medication list is accurate. If a new problem present, please set up appointment sooner than planned today.

## 2020-10-23 NOTE — Assessment & Plan Note (Signed)
We discussed Dx,prognosis,and treatment options. Continue Duloxetine 40 mg daily and Hydrocodone-Acetaminophen. Some side effects discussed. Fall precautions discussed.

## 2020-10-23 NOTE — Assessment & Plan Note (Signed)
Today rhythm and rate controlled. Tomorrow she has appt for INR. Continue Coumadin and Carvedilol same dose.

## 2020-10-23 NOTE — Assessment & Plan Note (Signed)
She has lost about 8 Lb since 06/2020 due to decreased oral intake, which aggravate nausea. She understands benefits of wt loss as well as adverse effects of obesity. Consistency with healthy high protein diet and low impact physical activity recommended.

## 2020-10-23 NOTE — Progress Notes (Addendum)
HPI:  Jane [name] is a 75 y.o. female, who is here today for 4 months follow up.   She was last seen on 07/10/20.  Since her last visit she has undergone vascular surgery, right UE fistula cannulation under US guidance.  Chronic pain: Generalized OA,mainly back pain and feet. A few days ago she had an exacerbation, severe bilateral,ankle,and foot pain. She could not walk and even missed a dialysis treatment. Upper back,IP hands, and left shoulder also affected. She takes Hydrocodone-Acetaminophen 5-325 mg daily as needed. She does not feel like medication is helping. Pain is worse in the morning when she gets up. + Stiffness. Heating pad on back helps with pain.  She would like to see pain management.  She is taking Duloxetine 40 mg daily, which has helped some. Pain is exacerbated by cold and rainy weather. Depressed mood sometimes due to concerns about recent health problems.  Depression screen [facility] 2/9 10/23/2020 07/31/2020 03/27/2020 03/27/2020 07/28/2019  Decreased Interest 0 0 1 1 1   Down, Depressed, Hopeless 0 1 0 0 2  PHQ - 2 Score 0 1 1 1 3   Altered sleeping 2 - 3 3 2   Tired, decreased energy 1 - 3 3 2   Change in appetite 1 - 1 - 2  Feeling bad or failure about yourself  0 - 0 - 0  Trouble concentrating 0 - 0 - 0  Moving slowly or fidgety/restless 0 - 0 - 0  Suicidal thoughts 0 - 0 - 0  PHQ-9 Score 4 - 8 7 9   Difficult doing work/chores Not difficult at all - Not difficult at all - Somewhat difficult   GAD 7 : Generalized Anxiety Score 10/23/2020 03/27/2020  Nervous, Anxious, on Edge 0 0  Control/stop worrying 0 0  Worry too much - different things 0 0  Trouble relaxing 0 1  Restless 3 0  Easily annoyed or irritable 0 0  Afraid - awful might happen 0 0  Total GAD 7 Score 3 1  Anxiety Difficulty Somewhat difficult Not difficult at all   4 months of persistent nausea, it is exacerbated by food intake. She has lost some wt because decreased oral intake. She is on  Sucralfate and Omeprazole 40 mg. She is following with GI. She takes Zofran 4 mg tid prn.  Pending EGD.  HTN: Home BP readings 140's/80-90's. Elevated right before dialysis, occasionally SBP 200's. She has headache when BP is high. Negative for visual changes, chest pain, dyspnea, palpitation,focal weakness, or worsening edema. She is on Hydroxyzine 25 mg tid,Amlodipine 5 mg daily,and Carvedilol 12.5 mg bid.  Reporting supra therapeutic INR, 6-7. She follows with coumadin clinic at [facility]. Coumadin dose has been adjusted. + Atrial fib.  INR will be checked tomorrow. She has not noted nose/gum bleeding, blood in stool,or melena,or gross hematuria (diuresis 1-2 times per day).  Review of Systems  Constitutional: Positive for fatigue. Negative for fever.  HENT: Negative for mouth sores and sore throat.   Respiratory: Negative for cough and wheezing.   Gastrointestinal: Negative for abdominal pain and vomiting.  Musculoskeletal: Positive for arthralgias, back pain and gait problem.  Skin: Negative for pallor and rash.  Neurological: Negative for syncope and facial asymmetry.  Psychiatric/Behavioral: Negative for confusion. The patient is nervous/anxious.   Rest of ROS, see pertinent positives sand negatives in HPI  Current Outpatient Medications on File Prior to Visit  Medication Sig Dispense Refill  . acetaminophen (TYLENOL) 500 MG tablet Take 1,500 mg by  mouth daily as needed for headache.    . albuterol (PROVENTIL HFA;VENTOLIN HFA) 108 (90 Base) MCG/ACT inhaler Inhale 1 puff into the lungs every 6 (six) hours as needed for wheezing or shortness of breath. (Patient taking differently: Inhale 2 puffs into the lungs every 6 (six) hours as needed for wheezing or shortness of breath.) 6.7 g 2  . amLODipine (NORVASC) 5 MG tablet Take 5 mg by mouth daily.    . B Complex-C-Zn-Folic Acid (DIALYVITE 211 WITH ZINC) 0.8 MG TABS Take 1 tablet by mouth daily.    . carvedilol (COREG) 12.5  MG tablet TAKE 1 TABLET BY MOUTH TWICE DAILY WITH A MEAL (Patient taking differently: Take 25 mg by mouth 2 (two) times daily with a meal.) 60 tablet 0  . ethyl chloride spray Apply 1 application topically daily as needed (port access).   12  . hydrALAZINE (APRESOLINE) 25 MG tablet Take 1 tablet (25 mg total) by mouth 3 (three) times daily. 270 tablet 2  . isosorbide dinitrate (ISORDIL) 30 MG tablet Take 1 tablet (30 mg total) by mouth 3 (three) times daily. 270 tablet 2  . linaclotide (LINZESS) 145 MCG CAPS capsule Take 1 capsule (145 mcg total) by mouth daily before breakfast. 30 capsule 3  . Methoxy PEG-Epoetin Beta (MIRCERA IJ) Mircera    . omeprazole (PRILOSEC) 40 MG capsule TAKE 1 CAPSULE BY MOUTH TWICE DAILY BEFORE A MEAL (Patient taking differently: Take 40 mg by mouth 2 (two) times daily.) 60 capsule 0  . ondansetron (ZOFRAN) 4 MG tablet Take 4 mg by mouth every 8 (eight) hours as needed for nausea or vomiting.    . sucralfate (CARAFATE) 1 GM/10ML suspension Take 10 mLs (1 g total) by mouth 2 (two) times daily. 420 mL 1  . warfarin (COUMADIN) 5 MG tablet As directed by coumadin clinic. (Patient taking differently: Take 5-7.5 mg by mouth See admin instructions. 7.5 mg every Mon, Wed, Fri; 5 mg all other days, or as directed by coumadin clinic.) 113 tablet 2   Current Facility-Administered Medications on File Prior to Visit  Medication Dose Route Frequency Provider Last Rate Last Admin  . 0.9 %  sodium chloride infusion  250 mL Intravenous PRN Marty Heck, MD      . sodium chloride flush (NS) 0.9 % injection 3 mL  3 mL Intravenous Q12H Marty Heck, MD      . sodium chloride flush (NS) 0.9 % injection 3 mL  3 mL Intravenous PRN Marty Heck, MD        Past Medical History:  Diagnosis Date  . Acid reflux   . Anemia of chronic disease   . Arthritis   . Asthma   . Atrial fibrillation ([place])   . Bilateral carotid bruits   . Complication of anesthesia    "hard to  wake up, I have sleep apnea" no CPAP  . Depression   . Diverticulitis   . Duodenal ulcer   . Dysrhythmia    Afib  . ESRD (end stage renal disease) ([place])    MWF [place]  . Headache   . History of blood transfusion   . Hypertension   . Malaise and fatigue   . Orthostatic hypotension   . Shortness of breath    " when I walk to fast"  . Sleep apnea   . Syncope   . Tubulovillous adenoma of colon    Allergies  Allergen Reactions  . Latex Rash  . Penicillins Other (  See Comments)    Yeast infection / Childhood  . Sulfa Antibiotics Rash  . Tape Other (See Comments)    Plastic, silicone, and paper tape causes bruising and pulls off skin. Cloth tape works fine    Social History   Socioeconomic History  . Marital status: Divorced    Spouse name: Not on file  . Number of children: 4  . Years of education: 4  . Highest education level: Associate degree: occupational, Hotel manager, or vocational program  Occupational History  . Occupation: Retired  Tobacco Use  . Smoking status: Never Smoker  . Smokeless tobacco: Never Used  Vaping Use  . Vaping Use: Never used  Substance and Sexual Activity  . Alcohol use: No  . Drug use: No  . Sexual activity: Never  Other Topics Concern  . Not on file  Social History Narrative   HH 1   Divorced   Outpatient dialysis Mon, Wed, Fri   4 children: 1 daughter locally is an Designer, multimedia and 3 sons in [place] Determinants of Health   Financial Resource Strain: [place]   . Difficulty of Paying Living Expenses: Not hard at all  Food Insecurity: No Food Insecurity  . Worried About Charity fundraiser in the Last Year: Never true  . Ran Out of Food in the Last Year: Never true  Transportation Needs: No Transportation Needs  . Lack of Transportation (Medical): No  . Lack of Transportation (Non-Medical): No  Physical Activity: Inactive  . Days of Exercise per Week: 0 days  . Minutes of Exercise per Session: 0 min  Stress:  Stress Concern Present  . Feeling of Stress : To some extent  Social Connections: Moderately Isolated  . Frequency of Communication with Friends and Family: More than three times a week  . Frequency of Social Gatherings with Friends and Family: Never  . Attends Religious Services: More than 4 times per year  . Active Member of Clubs or Organizations: No  . Attends Archivist Meetings: Never  . Marital Status: Divorced   Vitals:   10/23/20 0825  BP: 110/70  Pulse: 77  Resp: 16  SpO2: 91%  225 L on 07/10/20.  Wt Readings from Last 3 Encounters:  10/23/20 217 lb 2 oz (98.5 kg)  10/18/20 215 lb (97.5 kg)  10/16/20 215 lb 2 oz (97.6 kg)   Body mass index is 37.27 kg/m.  Physical Exam Vitals and nursing note reviewed.  Constitutional:      General: She is not in acute distress.    Appearance: She is well-developed.  HENT:     Head: Normocephalic and atraumatic.     Mouth/Throat:     Mouth: Mucous membranes are dry.  Eyes:     Conjunctiva/sclera: Conjunctivae normal.  Cardiovascular:     Rate and Rhythm: Normal rate and regular rhythm.     Heart sounds: Murmur (SEM I/VI RUSB and LUSB) heard.      Comments: PT pulse present,bilateral. Pulmonary:     Effort: Pulmonary effort is normal. No respiratory distress.     Breath sounds: Normal breath sounds.  Abdominal:     Palpations: Abdomen is soft.     Tenderness: There is no abdominal tenderness.  Musculoskeletal:     Lumbar back: Tenderness present. No bony tenderness. Decreased range of motion.       Back:     Right lower leg: No edema.     Left lower leg: No edema.  Comments: No signs of synovitis.  Lymphadenopathy:     Cervical: No cervical adenopathy.  Skin:    General: Skin is warm.     Findings: No erythema or rash.  Neurological:     Mental Status: She is alert and oriented to person, place, and time.     Comments: Antalgic gait, not assisted today.  Psychiatric:     Comments: Well groomed,  good eye contact.    ASSESSMENT AND PLAN:  Ms. Patty Raborn was seen today for 4 months follow-up.  No orders of the defined types were placed in this encounter.  Hypertension with renal disease Based on home BP's ,problem seems to be adequate for her. Continue Amlodipine 5 mg daily,Hydroxyzu=ine 25 mg tid, and Carvedilol 12.5 mg bid. Continue monitoring BP and low salt diet.  Paroxysmal atrial fibrillation (HCC) Today rhythm and rate controlled. Tomorrow she has appt for INR. Continue Coumadin and Carvedilol same dose.  Nausea without vomiting Problem seems to be getting worse. Following with GI and pending EGD. Continue Zofran 4 mg tid prn.  Generalized osteoarthritis of multiple sites We discussed Dx,prognosis,and treatment options. Continue Duloxetine 40 mg daily and Hydrocodone-Acetaminophen. Some side effects discussed. Fall precautions discussed.  Chronic pain disorder We discussed guidelines and current recommendations in regard to pain management and chronic opioid use. She prefers to hold on referral and will let me know in about 2 weeks if adjusting Hydrocodone-Acetaminophen helps. We are also holding on signing med contract.  Morbid obesity ([place]) She has lost about 8 Lb since 06/2020 due to decreased oral intake, which aggravate nausea. She understands benefits of wt loss as well as adverse effects of obesity. Consistency with healthy high protein diet and low impact physical activity recommended.   Depression, major, single episode, mild (HCC) PHQ improved. She feels like problem is aggravated by recent health complications and chronic medical problems. Continue Duloxetine 40 mg daily.  Spent 40 minutes.  During this time history was obtained and documented, examination was performed, prior labs reviewed, and assessment/plan discussed.  Return in about 3 months (around 01/22/2021).   Marshun Duva G. [name], MD  [facility]. [place] office.   A few  things to remember from today's visit:   Chronic pain disorder - Plan: HYDROcodone-acetaminophen (NORCO/VICODIN) 5-325 MG tablet  Hypertension with renal disease  Generalized osteoarthritis of multiple sites - Plan: DULoxetine (CYMBALTA) 20 MG capsule, HYDROcodone-acetaminophen (NORCO/VICODIN) 5-325 MG tablet  Depression, major, single episode, mild ([place])  If you need refills please call your pharmacy. Do not use My Chart to request refills or for acute issues that need immediate attention.   Hydrocodone-Acetaminophen increased to 2 tabs daily. You can take Tylenol 500 mg up to 2 tabs if needed in between. Let me know if you still would like to go to pain management. Fall precautions.  Continue Duloxetine 40 mg daily.  Please be sure medication list is accurate. If a new problem present, please set up appointment sooner than planned today.

## 2020-10-23 NOTE — Assessment & Plan Note (Signed)
Problem seems to be getting worse. Following with GI and pending EGD. Continue Zofran 4 mg tid prn.

## 2020-10-23 NOTE — Assessment & Plan Note (Addendum)
Based on home BP's ,problem seems to be adequate for her. Continue Amlodipine 5 mg daily,Hydroxyzu=ine 25 mg tid, and Carvedilol 12.5 mg bid. Continue monitoring BP and low salt diet.

## 2020-10-24 DIAGNOSIS — N186 End stage renal disease: Secondary | ICD-10-CM | POA: Diagnosis not present

## 2020-10-24 DIAGNOSIS — N2581 Secondary hyperparathyroidism of renal origin: Secondary | ICD-10-CM | POA: Diagnosis not present

## 2020-10-24 DIAGNOSIS — D509 Iron deficiency anemia, unspecified: Secondary | ICD-10-CM | POA: Diagnosis not present

## 2020-10-24 DIAGNOSIS — Z992 Dependence on renal dialysis: Secondary | ICD-10-CM | POA: Diagnosis not present

## 2020-10-24 DIAGNOSIS — D631 Anemia in chronic kidney disease: Secondary | ICD-10-CM | POA: Diagnosis not present

## 2020-10-25 ENCOUNTER — Other Ambulatory Visit: Payer: Self-pay

## 2020-10-25 ENCOUNTER — Ambulatory Visit: Payer: Medicare Other | Admitting: Pharmacist

## 2020-10-25 DIAGNOSIS — Z7901 Long term (current) use of anticoagulants: Secondary | ICD-10-CM | POA: Diagnosis not present

## 2020-10-25 DIAGNOSIS — I480 Paroxysmal atrial fibrillation: Secondary | ICD-10-CM

## 2020-10-25 DIAGNOSIS — Z5181 Encounter for therapeutic drug level monitoring: Secondary | ICD-10-CM

## 2020-10-25 NOTE — Progress Notes (Signed)
Anticoagulation Management Jane [name] is a 75 y.o. female who reports to the clinic for monitoring of warfarin treatment.    Indication: atrial fibrillation CHA2DS2 Vasc Score 4 (Age >32, female, HTN hx), HAS-BLED 2 (Age>65, renal disease)  Duration: indefinite Supervising physician: Adrian Prows  Anticoagulation Clinic Visit History:  Patient does not report signs/symptoms of bleeding or thromboembolism.  Other recent changes: No change in diet, medications, lifestyle. Pt continues to have persistent chronic pain in her back, knee, and neck. Significantly affecting patient's QOLs and ADLs.   S/p A/V fistulagram on 3/31. Recovering well. Held warfarin 3 days prior and took extra dose after the procedure as instructed. No noted bleeding diathesis. Pt has a scheduled EGD + EMR procedure on 11/29/20  Anticoagulation Episode Summary    Current INR goal:  2.0-3.0  TTR:  66.9 % (2.1 y)  Next INR check:  10/30/2020  INR from last check:  2.3 (10/18/2020)  Weekly max warfarin dose:    Target end date:  Indefinite  INR check location:    Preferred lab:    Send INR reminders to:     Indications   Paroxysmal atrial fibrillation (HCC) [I48.0] Monitoring for long-term anticoagulant use [Z51.81 Z79.01]       Comments:          Allergies  Allergen Reactions  . Latex Rash  . Penicillins Other (See Comments)    Yeast infection / Childhood  . Sulfa Antibiotics Rash  . Tape Other (See Comments)    Plastic, silicone, and paper tape causes bruising and pulls off skin. Cloth tape works fine    Current Outpatient Medications:  .  acetaminophen (TYLENOL) 500 MG tablet, Take 1,500 mg by mouth daily as needed for headache., Disp: , Rfl:  .  albuterol (PROVENTIL HFA;VENTOLIN HFA) 108 (90 Base) MCG/ACT inhaler, Inhale 1 puff into the lungs every 6 (six) hours as needed for wheezing or shortness of breath. (Patient taking differently: Inhale 2 puffs into the lungs every 6 (six) hours as needed for  wheezing or shortness of breath.), Disp: 6.7 g, Rfl: 2 .  amLODipine (NORVASC) 5 MG tablet, Take 5 mg by mouth daily., Disp: , Rfl:  .  B Complex-C-Zn-Folic Acid (DIALYVITE 678 WITH ZINC) 0.8 MG TABS, Take 1 tablet by mouth daily., Disp: , Rfl:  .  carvedilol (COREG) 12.5 MG tablet, TAKE 1 TABLET BY MOUTH TWICE DAILY WITH A MEAL (Patient taking differently: Take 25 mg by mouth 2 (two) times daily with a meal.), Disp: 60 tablet, Rfl: 0 .  DULoxetine (CYMBALTA) 20 MG capsule, Take 2 capsules (40 mg total) by mouth daily., Disp: 180 capsule, Rfl: 2 .  ethyl chloride spray, Apply 1 application topically daily as needed (port access). , Disp: , Rfl: 12 .  hydrALAZINE (APRESOLINE) 25 MG tablet, Take 1 tablet (25 mg total) by mouth 3 (three) times daily., Disp: 270 tablet, Rfl: 2 .  HYDROcodone-acetaminophen (NORCO/VICODIN) 5-325 MG tablet, Take 1 tablet by mouth every 12 (twelve) hours as needed for moderate pain., Disp: 60 tablet, Rfl: 0 .  isosorbide dinitrate (ISORDIL) 30 MG tablet, Take 1 tablet (30 mg total) by mouth 3 (three) times daily., Disp: 270 tablet, Rfl: 2 .  linaclotide (LINZESS) 145 MCG CAPS capsule, Take 1 capsule (145 mcg total) by mouth daily before breakfast., Disp: 30 capsule, Rfl: 3 .  Methoxy PEG-Epoetin Beta (MIRCERA IJ), Mircera, Disp: , Rfl:  .  omeprazole (PRILOSEC) 40 MG capsule, TAKE 1 CAPSULE BY MOUTH TWICE DAILY BEFORE  A MEAL (Patient taking differently: Take 40 mg by mouth 2 (two) times daily.), Disp: 60 capsule, Rfl: 0 .  ondansetron (ZOFRAN) 4 MG tablet, Take 4 mg by mouth every 8 (eight) hours as needed for nausea or vomiting., Disp: , Rfl:  .  sucralfate (CARAFATE) 1 GM/10ML suspension, Take 10 mLs (1 g total) by mouth 2 (two) times daily., Disp: 420 mL, Rfl: 1 .  warfarin (COUMADIN) 5 MG tablet, As directed by coumadin clinic. (Patient taking differently: Take 5-7.5 mg by mouth See admin instructions. 7.5 mg every Mon, Wed, Fri; 5 mg all other days, or as directed by  coumadin clinic.), Disp: 113 tablet, Rfl: 2  Current Facility-Administered Medications:  .  0.9 %  sodium chloride infusion, 250 mL, Intravenous, PRN, Marty Heck, MD .  sodium chloride flush (NS) 0.9 % injection 3 mL, 3 mL, Intravenous, Q12H, Marty Heck, MD .  sodium chloride flush (NS) 0.9 % injection 3 mL, 3 mL, Intravenous, PRN, Marty Heck, MD Past Medical History:  Diagnosis Date  . Acid reflux   . Anemia of chronic disease   . Arthritis   . Asthma   . Atrial fibrillation ([place])   . Bilateral carotid bruits   . Complication of anesthesia    "hard to wake up, I have sleep apnea" no CPAP  . Depression   . Diverticulitis   . Duodenal ulcer   . Dysrhythmia    Afib  . ESRD (end stage renal disease) ([place])    MWF [place]  . Headache   . History of blood transfusion   . Hypertension   . Malaise and fatigue   . Orthostatic hypotension   . Shortness of breath    " when I walk to fast"  . Sleep apnea   . Syncope   . Tubulovillous adenoma of colon    ASSESSMENT  Recent Results: The most recent result is correlated with 27.5 mg per week:  Lab Results  Component Value Date   INR 2.3 (H) 10/18/2020   INR 2.7 10/11/2020   INR 6.3 (A) 09/27/2020    Anticoagulation Dosing: Description   INR at goal. Continue taking 2.5 mg every Mon, Wed, Fri; 5 mg all other days. Recheck INR in 5 days      INR today: Therapeutic, after returning back to previous warfarin maintenance dose s/p fistulogram. Denies any relevant changes in her diet, medications, or lifestyle. Concerns of port site bleeding improving. Will continue current weekly dose and close monitoring to ensure that INR remains within therapeutic range.  PLAN Weekly dose was unchanged by 0% to 27.5 mg/week. Continue current weekly dose of 2.5 mg every Mon, Wed, Fri and 5 mg all other days. Recheck INR in 5 days.     Patient Instructions  INR at goal. Continue taking 2.5 mg every Mon, Wed,  Fri; 5 mg all other days. Recheck INR in 5 days  Patient advised to contact clinic or seek medical attention if signs/symptoms of bleeding or thromboembolism occur.  Patient verbalized understanding by repeating back information and was advised to contact me if further medication-related questions arise.   Follow-up Return in about 5 days (around 10/30/2020).  [name], PharmD  15 minutes spent face-to-face with the patient during the encounter. 50% of time spent on education, including signs/sx bleeding and clotting, as well as food and drug interactions with warfarin. 50% of time was spent on fingerprick POC INR sample collection,processing, results determination, and documentation

## 2020-10-25 NOTE — Patient Instructions (Signed)
INR at goal. Continue taking 2.5 mg every Mon, Wed, Fri; 5 mg all other days. Recheck INR in 5 days

## 2020-10-26 DIAGNOSIS — D631 Anemia in chronic kidney disease: Secondary | ICD-10-CM | POA: Diagnosis not present

## 2020-10-26 DIAGNOSIS — Z992 Dependence on renal dialysis: Secondary | ICD-10-CM | POA: Diagnosis not present

## 2020-10-26 DIAGNOSIS — N2581 Secondary hyperparathyroidism of renal origin: Secondary | ICD-10-CM | POA: Diagnosis not present

## 2020-10-26 DIAGNOSIS — D509 Iron deficiency anemia, unspecified: Secondary | ICD-10-CM | POA: Diagnosis not present

## 2020-10-26 DIAGNOSIS — N186 End stage renal disease: Secondary | ICD-10-CM | POA: Diagnosis not present

## 2020-10-29 DIAGNOSIS — N2581 Secondary hyperparathyroidism of renal origin: Secondary | ICD-10-CM | POA: Diagnosis not present

## 2020-10-29 DIAGNOSIS — Z992 Dependence on renal dialysis: Secondary | ICD-10-CM | POA: Diagnosis not present

## 2020-10-29 DIAGNOSIS — N186 End stage renal disease: Secondary | ICD-10-CM | POA: Diagnosis not present

## 2020-10-29 DIAGNOSIS — D509 Iron deficiency anemia, unspecified: Secondary | ICD-10-CM | POA: Diagnosis not present

## 2020-10-29 DIAGNOSIS — D631 Anemia in chronic kidney disease: Secondary | ICD-10-CM | POA: Diagnosis not present

## 2020-10-29 NOTE — Progress Notes (Addendum)
Primary Physician/Referring:  [name], [name], MD  [name], female    DOB: Sep 29, 1945, 75 y.o.   MRN: [id number]  Chief Complaint  Patient presents with  . Atrial Fibrillation  . Hypertension  . Follow-up   HPI:    Jane [name]  is a 75 y.o. female with paroxysmal atrial fibrillation, end-stage disease on hemodialysis on Monday Wednesday and Friday, orthostatic hypotension, obstructive sleep apnea not on CPAP, external hemorrhoids but tolerating Coumadin, which is managed by out office anticoagulation clinic. Patient was previously on low-dose amiodarone due to prolonged QT but this was discountined in 202 by Jeri Lager, NP.   Patient presents for 71-month follow-up of hypertension and paroxysmal atrial fibrillation.  At last visit new prominent bilateral carotid bruits were noted, therefore patient underwent repeat carotid artery duplex which revealed mild atherosclerosis bilaterally.  Since last visit patient has continued to follow with GI for management of polyps and chronic constipation as well as nausea and vomiting.  Patient continues to struggle with severe back pain which limits her activity.  However from a cardiovascular standpoint she reports she is doing well.  On home blood pressure monitoring, patient states her blood pressure spikes when she is in pain, but overall is well controlled.  She has had no further GI bleed or melena.  Denies chest pain, palpitations, dyspnea, syncope, near syncope, dizziness.  Past Medical History:  Diagnosis Date  . Acid reflux   . Anemia of chronic disease   . Arthritis   . Asthma   . Atrial fibrillation ([place])   . Bilateral carotid bruits   . Complication of anesthesia    "hard to wake up, I have sleep apnea" no CPAP  . Depression   . Diverticulitis   . Duodenal ulcer   . Dysrhythmia    Afib  . ESRD (end stage renal disease) ([place])    MWF [place]  . Headache   . History of blood transfusion   . Hypertension   .  Malaise and fatigue   . Orthostatic hypotension   . Shortness of breath    " when I walk to fast"  . Sleep apnea   . Syncope   . Tubulovillous adenoma of colon    Past Surgical History:  Procedure Laterality Date  . A/V FISTULAGRAM Right 10/18/2020   Procedure: A/V FISTULAGRAM;  Surgeon: Marty Heck, MD;  Location: [place] [facility];  Service: Cardiovascular;  Laterality: Right;  . AV FISTULA PLACEMENT    . BACK SURGERY     Lumbar fusion L 4 and L 5  . BIOPSY  01/09/2020   Procedure: BIOPSY;  Surgeon: Rush Landmark Telford Nab., MD;  Location: [place];  Service: Gastroenterology;;  . COLONOSCOPY    . ENDOSCOPIC MUCOSAL RESECTION N/A 01/09/2020   Procedure: ENDOSCOPIC MUCOSAL RESECTION;  Surgeon: Rush Landmark Telford Nab., MD;  Location: [place];  Service: Gastroenterology;  Laterality: N/A;  . ESOPHAGOGASTRODUODENOSCOPY    . ESOPHAGOGASTRODUODENOSCOPY (EGD) WITH PROPOFOL N/A 01/09/2020   Procedure: ESOPHAGOGASTRODUODENOSCOPY (EGD) WITH PROPOFOL;  Surgeon: Rush Landmark Telford Nab., MD;  Location: [place];  Service: Gastroenterology;  Laterality: N/A;  . ESOPHAGOGASTRODUODENOSCOPY (EGD) WITH PROPOFOL N/A 01/13/2020   Procedure: ESOPHAGOGASTRODUODENOSCOPY (EGD) WITH PROPOFOL;  Surgeon: Irene Shipper, MD;  Location: [place];  Service: Gastroenterology;  Laterality: N/A;  . EUS N/A 01/09/2020   Procedure: UPPER ENDOSCOPIC ULTRASOUND (EUS) RADIAL;  Surgeon: Rush Landmark Telford Nab., MD;  Location: [place];  Service: Gastroenterology;  Laterality: N/A;  . HEMOSTASIS  CLIP PLACEMENT  01/09/2020   Procedure: HEMOSTASIS CLIP PLACEMENT;  Surgeon: Irving Copas., MD;  Location: [place];  Service: Gastroenterology;;  . HEMOSTASIS CONTROL  01/13/2020   Procedure: HEMOSTASIS CONTROL;  Surgeon: Irene Shipper, MD;  Location: [facility] ENDOSCOPY;  Service: Gastroenterology;;  Vassie Loll  . HOT HEMOSTASIS N/A 01/13/2020   Procedure: HOT HEMOSTASIS (ARGON PLASMA  COAGULATION/BICAP);  Surgeon: Irene Shipper, MD;  Location: [place];  Service: Gastroenterology;  Laterality: N/A;  . IR GENERIC HISTORICAL  07/09/2016   IR US GUIDE VASC ACCESS RIGHT 07/09/2016 Arne Cleveland, MD [facility]  . IR GENERIC HISTORICAL  07/09/2016   IR FLUORO GUIDE CV LINE RIGHT 07/09/2016 Arne Cleveland, MD [facility]  . KNEE ARTHROPLASTY Left   . LAPAROSCOPIC SIGMOID COLECTOMY N/A 07/11/2016   Procedure: LAPAROSCOPIC SIGMOID COLECTOMY;  Surgeon: Clovis Riley, MD;  Location: [place];  Service: General;  Laterality: N/A;  . MASS EXCISION Right 04/10/2020   Procedure: EXCISION SKIN NODULE RIGHT FOREARM;  Surgeon: Waynetta Sandy, MD;  Location: [place];  Service: Vascular;  Laterality: Right;  . SCLEROTHERAPY  01/09/2020   Procedure: Clide Deutscher;  Surgeon: Mansouraty, Telford Nab., MD;  Location: [place];  Service: Gastroenterology;;  . Clide Deutscher  01/13/2020   Procedure: Clide Deutscher;  Surgeon: Irene Shipper, MD;  Location: [facility] ENDOSCOPY;  Service: Gastroenterology;;  . Maryagnes Amos INJECTION  01/09/2020   Procedure: SUBMUCOSAL LIFTING INJECTION;  Surgeon: Irving Copas., MD;  Location: [facility] ENDOSCOPY;  Service: Gastroenterology;;  . TUBAL LIGATION     Family History  Problem Relation Age of Onset  . Heart failure Mother   . Stroke Mother   . Other Father   . Colon cancer Neg Hx   . Liver disease Neg Hx   . Esophageal cancer Neg Hx   . Stomach cancer Neg Hx   . Inflammatory bowel disease Neg Hx   . Rectal cancer Neg Hx   . Pancreatic cancer Neg Hx     Social History   Tobacco Use  . Smoking status: Never Smoker  . Smokeless tobacco: Never Used  Substance Use Topics  . Alcohol use: No   ROS  Review of Systems  Constitutional: Negative for malaise/fatigue and weight gain.  Cardiovascular: Negative for chest pain, claudication, dyspnea on exertion, leg swelling, near-syncope, orthopnea, palpitations, paroxysmal nocturnal  dyspnea and syncope.  Respiratory: Negative for shortness of breath.   Hematologic/Lymphatic: Does not bruise/bleed easily.  Gastrointestinal: Positive for constipation and hemorrhoids (not active). Negative for melena.  Neurological: Negative for dizziness (occurs with low BP) and weakness.   Objective  Blood pressure 132/60, pulse 65, height 5\' 4"  (1.626 m), weight 217 lb (98.4 kg).  Vitals with BMI 10/30/2020 10/23/2020 10/18/2020  Height 5\' 4"  5\' 4"  -  Weight 217 lbs 217 lbs 2 oz -  BMI 63.14 97.02 -  Systolic 637 858 850  Diastolic 60 70 78  Pulse 65 77 77    Physical Exam Vitals reviewed.  Constitutional:      Comments: Moderately obese  HENT:     Head: Normocephalic and atraumatic.  Cardiovascular:     Rate and Rhythm: Normal rate and regular rhythm.     Pulses: Intact distal pulses.          Carotid pulses are on the right side with bruit and on the left side with bruit.    Heart sounds: S1 normal and S2 normal. Murmur heard.   Early systolic murmur is present with a grade  of 2/6 at the upper right sternal border. No gallop.      Comments: No JVD. Right arm AV fistula noted and functioning. Pulmonary:     Effort: Pulmonary effort is normal. No respiratory distress.     Breath sounds: Normal breath sounds. No wheezing, rhonchi or rales.  Abdominal:     General: Bowel sounds are normal.     Palpations: Abdomen is soft.  Musculoskeletal:     Right lower leg: No edema.     Left lower leg: No edema.  Neurological:     Mental Status: She is alert.    Laboratory examination:   Recent Labs    01/16/20 0305 01/17/20 0408 01/18/20 0714 04/10/20 0810 10/18/20 1017  NA 137 137 137 137 141  K 4.1 3.7 3.7 4.6 3.9  CL 102 100 99 99 101  CO2 24 27 26   --   --   GLUCOSE 100* 100* 111* 97 98  BUN 46* 16 25* 44* 22  CREATININE 11.44* 6.36* 9.19* 9.10* 6.60*  CALCIUM 8.0* 8.9 8.7*  --   --   GFRNONAA 3* 6* 4*  --   --   GFRAA 3* 7* 4*  --   --    estimated creatinine  clearance is 8.3 mL/min (A) (by C-G formula based on SCr of 6.6 mg/dL (H)).  CMP Latest Ref Rng & Units 10/18/2020 04/10/2020 01/18/2020  Glucose 70 - 99 mg/dL 98 97 111(H)  BUN 8 - 23 mg/dL 22 44(H) 25(H)  Creatinine 0.44 - 1.00 mg/dL 6.60(H) 9.10(H) 9.19(H)  Sodium 135 - 145 mmol/L 141 137 137  Potassium 3.5 - 5.1 mmol/L 3.9 4.6 3.7  Chloride 98 - 111 mmol/L 101 99 99  CO2 22 - 32 mmol/L - - 26  Calcium 8.9 - 10.3 mg/dL - - 8.7(L)  Total Protein 6.5 - 8.1 g/dL - - -  Total Bilirubin 0.3 - 1.2 mg/dL - - -  Alkaline Phos 38 - 126 U/L - - -  AST 15 - 41 U/L - - -  ALT 0 - 44 U/L - - -   CBC Latest Ref Rng & Units 10/18/2020 04/10/2020 01/19/2020  WBC 4.0 - 10.5 K/uL - - 6.0  Hemoglobin 12.0 - 15.0 g/dL 13.6 15.3(H) 8.5(L)  Hematocrit 36.0 - 46.0 % 40.0 45.0 27.1(L)  Platelets 150 - 400 K/uL - - 171   Lipid Panel No results for input(s): CHOL, TRIG, LDLCALC, VLDL, HDL, CHOLHDL, LDLDIRECT in the last 8760 hours. HEMOGLOBIN A1C No results found for: HGBA1C, MPG   TSH Recent Labs    01/13/20 0733  TSH 0.351   External labs:  10/06/2019: Total cholesterol 198, HDL 53, LDL 135, triglycerides 51  Medications and allergies   Allergies  Allergen Reactions  . Latex Rash  . Penicillins Other (See Comments)    Yeast infection / Childhood  . Sulfa Antibiotics Rash  . Tape Other (See Comments)    Plastic, silicone, and paper tape causes bruising and pulls off skin. Cloth tape works fine    Current Outpatient Medications  Medication Instructions  . acetaminophen (TYLENOL) 1,500 mg, Oral, Daily PRN  . albuterol (PROVENTIL HFA;VENTOLIN HFA) 108 (90 Base) MCG/ACT inhaler 1 puff, Inhalation, Every 6 hours PRN  . amLODipine (NORVASC) 5 mg, Oral, Daily  . B Complex-C-Zn-Folic Acid (DIALYVITE 784 WITH ZINC) 0.8 MG TABS 1 tablet, Oral, Daily  . carvedilol (COREG) 25 mg, Oral, 2 times daily with meals  . DULoxetine (CYMBALTA) 40 mg, Oral, Daily  .  ethyl chloride spray 1 application, Topical,  Daily PRN  . hydrALAZINE (APRESOLINE) 25 mg, Oral, 3 times daily  . HYDROcodone-acetaminophen (NORCO/VICODIN) 5-325 MG tablet 1 tablet, Oral, Every 12 hours PRN  . isosorbide dinitrate (ISORDIL) 30 mg, Oral, 3 times daily  . linaclotide (LINZESS) 145 mcg, Oral, Daily before breakfast  . Methoxy PEG-Epoetin Beta (MIRCERA IJ) Mircera  . omeprazole (PRILOSEC) 40 MG capsule TAKE 1 CAPSULE BY MOUTH TWICE DAILY BEFORE A MEAL  . ondansetron (ZOFRAN) 4 mg, Oral, Every 8 hours PRN  . sucralfate (CARAFATE) 1 g, Oral, 2 times daily  . warfarin (COUMADIN) 5 MG tablet As directed by coumadin clinic.    Radiology:   No results found.  Cardiac Studies:   Echocardiogram 11/24/2017: Left ventricle cavity is normal in size. Mild concentric hypertrophy of the left ventricle. Normal global wall motion. Doppler evidence of grade II (pseudonormal) diastolic dysfunction, elevated LAP. Calculated EF 65%. Left atrial cavity is severely dilated. Mildly restricted aortic valve leaflets with trace aortic stenosis. Aortic valve mean gradient of 6 mmHg, Vmax of 1.8 m/s. Calculated aortic valve area by continuity equation is 2.1 cm. Mild (Grade I) mitral regurgitation. Mild to moderate tricuspid regurgitation. Estimated pulmonary artery systolic pressure 67-20 mmHg.  Lexiscan myoview stress test 09/06/2018:  1. Lexiscan stress test was performed. Exercise capacity was not assessed. Stress symptoms included dyspnea, dizziness. Peak effect blood pressure was 158/68 mmHg. The resting and stress electrocardiogram demonstrated normal sinus rhythm, normal resting conduction, no resting arrhythmias and normal rest repolarization. Stress EKG is non diagnostic for ischemia as it is a pharmacologic stress.  2. The overall quality of the study is good. There is no evidence of abnormal lung activity. Stress and rest SPECT images demonstrate homogeneous tracer distribution throughout the myocardium. Gated SPECT imaging reveals  normal myocardial thickening and wall motion. The left ventricular ejection fraction was normal (61%).  3. Low risk study.  Carotid artery duplex 04/24/2020:  Stenosis in the right external carotid artery (<50%).  Minimal stenosis in the left internal carotid artery (minimal).  Stenosis in the left common carotid artery (<50%).  There is mild diffuse heterogeneous plaque in bilateral carotid arteries.  Antegrade right vertebral artery flow.  Follow up in one year is appropriate if clinically indicated.  AV Fistulagram 10/18/2020:  1. There was a good thrill in the forearm where the radiocephalic fistula was accessed. Ultimately this showed that the stents in the cephalic vein throughout the upper arm are occluded. The forearm cephalic vein fistula is patent and drains into the median cubital vein and then into the deep system and has brisk flow in the upper arm through deep brachial veins into axillary veinwith no central stenosis. She states the fistula is working and she dialyzed on Wednesday. I discussed I would not make any changes if the fistula is working and ultimately may require new dialysis access in the future if this fails. The stents appear chronically occluded givencollateralization in the forearm with adequate venous outflow in the upper arm.  EKG  EKG 10/30/2020: Sinus rhythm at a rate of 69 bpm.  Left atrial enlargement.  Left axis, left anterior fascicular block.  Poor R wave progression, cannot exclude anteroseptal infarct old.  Low voltage complexes.  Compared to EKG 10/06/2019, no significant change.  Assessment     ICD-10-CM   1. Paroxysmal atrial fibrillation (HCC)  I48.0 EKG 12-Lead  2. Carotid atherosclerosis, bilateral  I65.23   3. Orthostatic hypotension  I95.1   4. Hypertension with  renal disease  I12.9 carvedilol (COREG) 25 MG tablet  5. Monitoring for long-term anticoagulant use  Z51.81    Z79.01     CHA2DS2-VASc Score is 4.  Yearly risk of stroke: 4.8%  (HTN, female, age 70).  Meds ordered this encounter  Medications  . carvedilol (COREG) 25 MG tablet    Sig: Take 1 tablet (25 mg total) by mouth 2 (two) times daily with a meal.    Dispense:  180 tablet    Refill:  3    Medications Discontinued During This Encounter  Medication Reason  . carvedilol (COREG) 12.5 MG tablet Reorder     Recommendations:   Jane [name]  is a 75 y.o.  female with paroxysmal atrial fibrillation, end-stage disease on hemodialysis on Monday Wednesday and Friday, orthostatic hypotension, obstructive sleep apnea not on CPAP, external hemorrhoids but tolerating Coumadin, which is managed by out office anticoagulation clinic. Patient was previously on low-dose amiodarone due to prolonged QT but this was discountined in 202 by Jeri Lager, NP.   Patient presents for 29-month follow-up presents here for 55-month follow-up.Of hypertension and paroxysmal atrial fibrillation.  At last visit new prominent bilateral carotid bruits were noted, therefore patient underwent repeat carotid artery duplex which revealed mild atherosclerosis bilaterally.  Reviewed and discussed with patient regarding results of carotid artery duplex, she verbalized understanding agreement.  Carotid artery duplex details above, will plan for repeat monitoring in 1 year.  I personally reviewed external labs, LDL is not at goal.  Discussed with patient in view of elevated LDL and carotid artery atherosclerosis recommend initiation of statin therapy at this time.  Discussed risks and benefits of statin therapy, patient prefers not to initiate statin therapy as she already feels she is on too many medications.   In regard to paroxysmal atrial fibrillation, she has had no known recurrence.  In view of CHA2DS2-VASc score of 4 patient remains on warfarin which is managed by our anticoagulation clinic.  She has had no further GIB or bleeding diathesis.  Patient's blood pressure is presently well controlled, will not  make changes to her medications at this time.  Patient is relatively stable from a cardiovascular standpoint without angina or palpitations and without clinical signs of heart failure.  Follow-up in 6 months, sooner if needed, for hyperlipidemia, hypertension, paroxysmal atrial fibrillation, and orthostatic hypotension.    Jane Berthold, PA-C 10/30/2020, 12:16 PM Office: [phone]

## 2020-10-30 ENCOUNTER — Encounter: Payer: Self-pay | Admitting: Student

## 2020-10-30 ENCOUNTER — Ambulatory Visit: Payer: Medicare Other | Admitting: Student

## 2020-10-30 ENCOUNTER — Other Ambulatory Visit: Payer: Self-pay

## 2020-10-30 VITALS — BP 132/60 | HR 65 | Ht 64.0 in | Wt 217.0 lb

## 2020-10-30 DIAGNOSIS — I951 Orthostatic hypotension: Secondary | ICD-10-CM | POA: Diagnosis not present

## 2020-10-30 DIAGNOSIS — Z7901 Long term (current) use of anticoagulants: Secondary | ICD-10-CM | POA: Diagnosis not present

## 2020-10-30 DIAGNOSIS — I480 Paroxysmal atrial fibrillation: Secondary | ICD-10-CM | POA: Diagnosis not present

## 2020-10-30 DIAGNOSIS — I6523 Occlusion and stenosis of bilateral carotid arteries: Secondary | ICD-10-CM | POA: Diagnosis not present

## 2020-10-30 DIAGNOSIS — Z5181 Encounter for therapeutic drug level monitoring: Secondary | ICD-10-CM

## 2020-10-30 DIAGNOSIS — I129 Hypertensive chronic kidney disease with stage 1 through stage 4 chronic kidney disease, or unspecified chronic kidney disease: Secondary | ICD-10-CM

## 2020-10-30 LAB — POCT INR: INR: 3.1 — AB (ref 2.0–3.0)

## 2020-10-30 MED ORDER — CARVEDILOL 25 MG PO TABS: 25.0000 mg | ORAL_TABLET | Freq: Two times a day (BID) | ORAL | 3 refills | Status: DC

## 2020-10-30 NOTE — Patient Instructions (Signed)
INR slightly above goal. Continue taking 2.5 mg every Mon, Wed, Fri; 5 mg all other days. Recheck INR in 3 weeks.

## 2020-10-31 DIAGNOSIS — I4891 Unspecified atrial fibrillation: Secondary | ICD-10-CM | POA: Diagnosis not present

## 2020-10-31 DIAGNOSIS — N186 End stage renal disease: Secondary | ICD-10-CM | POA: Diagnosis not present

## 2020-10-31 DIAGNOSIS — D509 Iron deficiency anemia, unspecified: Secondary | ICD-10-CM | POA: Diagnosis not present

## 2020-10-31 DIAGNOSIS — Z992 Dependence on renal dialysis: Secondary | ICD-10-CM | POA: Diagnosis not present

## 2020-10-31 DIAGNOSIS — E1129 Type 2 diabetes mellitus with other diabetic kidney complication: Secondary | ICD-10-CM | POA: Diagnosis not present

## 2020-10-31 DIAGNOSIS — N2581 Secondary hyperparathyroidism of renal origin: Secondary | ICD-10-CM | POA: Diagnosis not present

## 2020-10-31 DIAGNOSIS — D631 Anemia in chronic kidney disease: Secondary | ICD-10-CM | POA: Diagnosis not present

## 2020-11-02 DIAGNOSIS — Z992 Dependence on renal dialysis: Secondary | ICD-10-CM | POA: Diagnosis not present

## 2020-11-02 DIAGNOSIS — D509 Iron deficiency anemia, unspecified: Secondary | ICD-10-CM | POA: Diagnosis not present

## 2020-11-02 DIAGNOSIS — N2581 Secondary hyperparathyroidism of renal origin: Secondary | ICD-10-CM | POA: Diagnosis not present

## 2020-11-02 DIAGNOSIS — N186 End stage renal disease: Secondary | ICD-10-CM | POA: Diagnosis not present

## 2020-11-02 DIAGNOSIS — D631 Anemia in chronic kidney disease: Secondary | ICD-10-CM | POA: Diagnosis not present

## 2020-11-05 DIAGNOSIS — D509 Iron deficiency anemia, unspecified: Secondary | ICD-10-CM | POA: Diagnosis not present

## 2020-11-05 DIAGNOSIS — D631 Anemia in chronic kidney disease: Secondary | ICD-10-CM | POA: Diagnosis not present

## 2020-11-05 DIAGNOSIS — N2581 Secondary hyperparathyroidism of renal origin: Secondary | ICD-10-CM | POA: Diagnosis not present

## 2020-11-05 DIAGNOSIS — N186 End stage renal disease: Secondary | ICD-10-CM | POA: Diagnosis not present

## 2020-11-05 DIAGNOSIS — Z992 Dependence on renal dialysis: Secondary | ICD-10-CM | POA: Diagnosis not present

## 2020-11-07 DIAGNOSIS — Z992 Dependence on renal dialysis: Secondary | ICD-10-CM | POA: Diagnosis not present

## 2020-11-07 DIAGNOSIS — N186 End stage renal disease: Secondary | ICD-10-CM | POA: Diagnosis not present

## 2020-11-07 DIAGNOSIS — D509 Iron deficiency anemia, unspecified: Secondary | ICD-10-CM | POA: Diagnosis not present

## 2020-11-07 DIAGNOSIS — N2581 Secondary hyperparathyroidism of renal origin: Secondary | ICD-10-CM | POA: Diagnosis not present

## 2020-11-07 DIAGNOSIS — D631 Anemia in chronic kidney disease: Secondary | ICD-10-CM | POA: Diagnosis not present

## 2020-11-08 ENCOUNTER — Other Ambulatory Visit: Payer: Self-pay | Admitting: Family Medicine

## 2020-11-08 ENCOUNTER — Other Ambulatory Visit: Payer: Self-pay

## 2020-11-08 ENCOUNTER — Ambulatory Visit: Payer: Medicare Other | Admitting: Pharmacist

## 2020-11-08 ENCOUNTER — Other Ambulatory Visit: Payer: Self-pay | Admitting: Internal Medicine

## 2020-11-08 DIAGNOSIS — I480 Paroxysmal atrial fibrillation: Secondary | ICD-10-CM

## 2020-11-08 DIAGNOSIS — Z7901 Long term (current) use of anticoagulants: Secondary | ICD-10-CM

## 2020-11-08 DIAGNOSIS — Z5181 Encounter for therapeutic drug level monitoring: Secondary | ICD-10-CM | POA: Diagnosis not present

## 2020-11-08 LAB — POCT INR: INR: 2.9 (ref 2.0–3.0)

## 2020-11-08 NOTE — Progress Notes (Signed)
Anticoagulation Management Jane [name] is a 75 y.o. female who reports to the clinic for monitoring of warfarin treatment.    Indication: atrial fibrillation CHA2DS2 Vasc Score 4 (Age >82, female, HTN hx), HAS-BLED 2 (Age>65, renal disease)  Duration: indefinite Supervising physician: Adrian Prows  Anticoagulation Clinic Visit History:  Patient does not report signs/symptoms of bleeding or thromboembolism.  Other recent changes: No change in diet, medications, lifestyle. Pt continues to have persistent chronic pain in her back, knee, and neck. Significantly affecting patient's QOLs and ADLs.   Pt has a scheduled EGD + EMR procedure on 11/29/20. Recent INR on 11/02/20.at the dialysis center of 3.37. Pt denies any bleeding or bruising symptoms.   Anticoagulation Episode Summary    Current INR goal:  2.0-3.0  TTR:  67.1 % (2.1 y)  Next INR check:  11/22/2020  INR from last check:  2.9 (11/08/2020)  Weekly max warfarin dose:    Target end date:  Indefinite  INR check location:    Preferred lab:    Send INR reminders to:     Indications   Paroxysmal atrial fibrillation (HCC) [I48.0] Monitoring for long-term anticoagulant use [Z51.81 Z79.01]       Comments:          Allergies  Allergen Reactions  . Latex Rash  . Penicillins Other (See Comments)    Yeast infection / Childhood  . Sulfa Antibiotics Rash  . Tape Other (See Comments)    Plastic, silicone, and paper tape causes bruising and pulls off skin. Cloth tape works fine    Current Outpatient Medications:  .  amLODipine (NORVASC) 5 MG tablet, Take 5 mg by mouth in the morning and at bedtime., Disp: , Rfl:  .  acetaminophen (TYLENOL) 500 MG tablet, Take 1,500 mg by mouth daily as needed for headache., Disp: , Rfl:  .  albuterol (PROVENTIL HFA;VENTOLIN HFA) 108 (90 Base) MCG/ACT inhaler, Inhale 1 puff into the lungs every 6 (six) hours as needed for wheezing or shortness of breath. (Patient taking differently: Inhale 2 puffs into the  lungs every 6 (six) hours as needed for wheezing or shortness of breath.), Disp: 6.7 g, Rfl: 2 .  B Complex-C-Zn-Folic Acid (DIALYVITE 660 WITH ZINC) 0.8 MG TABS, Take 1 tablet by mouth daily., Disp: , Rfl:  .  carvedilol (COREG) 25 MG tablet, Take 1 tablet (25 mg total) by mouth 2 (two) times daily with a meal., Disp: 180 tablet, Rfl: 3 .  DULoxetine (CYMBALTA) 20 MG capsule, Take 2 capsules (40 mg total) by mouth daily., Disp: 180 capsule, Rfl: 2 .  ethyl chloride spray, Apply 1 application topically daily as needed (port access). , Disp: , Rfl: 12 .  hydrALAZINE (APRESOLINE) 25 MG tablet, Take 1 tablet (25 mg total) by mouth 3 (three) times daily., Disp: 270 tablet, Rfl: 2 .  HYDROcodone-acetaminophen (NORCO/VICODIN) 5-325 MG tablet, Take 1 tablet by mouth every 12 (twelve) hours as needed for moderate pain., Disp: 60 tablet, Rfl: 0 .  isosorbide dinitrate (ISORDIL) 30 MG tablet, Take 1 tablet (30 mg total) by mouth 3 (three) times daily., Disp: 270 tablet, Rfl: 2 .  LINZESS 145 MCG CAPS capsule, TAKE 1 CAPSULE BY MOUTH ONCE DAILY BEFORE  BREAKFAST, Disp: 30 capsule, Rfl: 0 .  Methoxy PEG-Epoetin Beta (MIRCERA IJ), Mircera, Disp: , Rfl:  .  omeprazole (PRILOSEC) 40 MG capsule, TAKE 1 CAPSULE BY MOUTH TWICE DAILY BEFORE A MEAL (Patient taking differently: Take 40 mg by mouth 2 (two) times daily.), Disp:  60 capsule, Rfl: 0 .  ondansetron (ZOFRAN) 4 MG tablet, Take 4 mg by mouth every 8 (eight) hours as needed for nausea or vomiting., Disp: , Rfl:  .  sucralfate (CARAFATE) 1 GM/10ML suspension, Take 10 mLs (1 g total) by mouth 2 (two) times daily., Disp: 420 mL, Rfl: 1 .  warfarin (COUMADIN) 5 MG tablet, As directed by coumadin clinic. (Patient taking differently: Take 5-7.5 mg by mouth See admin instructions. 7.5 mg every Mon, Wed, Fri; 5 mg all other days, or as directed by coumadin clinic.), Disp: 113 tablet, Rfl: 2  Current Facility-Administered Medications:  .  0.9 %  sodium chloride infusion,  250 mL, Intravenous, PRN, Marty Heck, MD .  sodium chloride flush (NS) 0.9 % injection 3 mL, 3 mL, Intravenous, Q12H, Marty Heck, MD .  sodium chloride flush (NS) 0.9 % injection 3 mL, 3 mL, Intravenous, PRN, Marty Heck, MD Past Medical History:  Diagnosis Date  . Acid reflux   . Anemia of chronic disease   . Arthritis   . Asthma   . Atrial fibrillation ([place])   . Bilateral carotid bruits   . Complication of anesthesia    "hard to wake up, I have sleep apnea" no CPAP  . Depression   . Diverticulitis   . Duodenal ulcer   . Dysrhythmia    Afib  . ESRD (end stage renal disease) ([place])    MWF [place]  . Headache   . History of blood transfusion   . Hypertension   . Malaise and fatigue   . Orthostatic hypotension   . Shortness of breath    " when I walk to fast"  . Sleep apnea   . Syncope   . Tubulovillous adenoma of colon    ASSESSMENT  Recent Results: The most recent result is correlated with 27.5 mg per week:  Lab Results  Component Value Date   INR 2.9 11/08/2020   INR 3.1 (A) 10/30/2020   INR 2.3 (H) 10/18/2020    Anticoagulation Dosing: Description   INR slightly above goal. Continue taking 2.5 mg every Mon, Wed, Fri; 5 mg all other days. Recheck INR in 3 weeks. Increase salad intake to 2-3/week       INR today: Therapeutic Denies any relevant changes in her diet, medications, or lifestyle. Concerns of port site bleeding improving. Denies any complains of bleeding and bruising. Pt agreeable to increased salad intake to 2-3 servings/week to help moderate recent mild INR elevation. Pt has an upcoming EGD procedure, will review hold direction at the next upcoming OV.   PLAN Weekly dose was unchanged by 0% to 27.5 mg/week. Continue current weekly dose of 2.5 mg every Mon, Wed, Fri and 5 mg all other days. Increase Vit K intake 2-3 servings/week. Recheck INR in 2 weeks.     Patient Instructions  INR slightly above goal. Continue  taking 2.5 mg every Mon, Wed, Fri; 5 mg all other days. Recheck INR in 3 weeks. Increase salad intake to 2-3/week  Patient advised to contact clinic or seek medical attention if signs/symptoms of bleeding or thromboembolism occur.  Patient verbalized understanding by repeating back information and was advised to contact me if further medication-related questions arise.   Follow-up Return in about 2 weeks (around 11/22/2020).  [name], PharmD  15 minutes spent face-to-face with the patient during the encounter. 50% of time spent on education, including signs/sx bleeding and clotting, as well as food and drug interactions with  warfarin. 50% of time was spent on fingerprick POC INR sample collection,processing, results determination, and documentation

## 2020-11-08 NOTE — Patient Instructions (Signed)
INR slightly above goal. Continue taking 2.5 mg every Mon, Wed, Fri; 5 mg all other days. Recheck INR in 3 weeks. Increase salad intake to 2-3/week

## 2020-11-09 DIAGNOSIS — D631 Anemia in chronic kidney disease: Secondary | ICD-10-CM | POA: Diagnosis not present

## 2020-11-09 DIAGNOSIS — Z992 Dependence on renal dialysis: Secondary | ICD-10-CM | POA: Diagnosis not present

## 2020-11-09 DIAGNOSIS — D509 Iron deficiency anemia, unspecified: Secondary | ICD-10-CM | POA: Diagnosis not present

## 2020-11-09 DIAGNOSIS — N2581 Secondary hyperparathyroidism of renal origin: Secondary | ICD-10-CM | POA: Diagnosis not present

## 2020-11-09 DIAGNOSIS — N186 End stage renal disease: Secondary | ICD-10-CM | POA: Diagnosis not present

## 2020-11-12 ENCOUNTER — Other Ambulatory Visit: Payer: Self-pay

## 2020-11-12 DIAGNOSIS — N2581 Secondary hyperparathyroidism of renal origin: Secondary | ICD-10-CM | POA: Diagnosis not present

## 2020-11-12 DIAGNOSIS — Z992 Dependence on renal dialysis: Secondary | ICD-10-CM | POA: Diagnosis not present

## 2020-11-12 DIAGNOSIS — N186 End stage renal disease: Secondary | ICD-10-CM | POA: Diagnosis not present

## 2020-11-12 DIAGNOSIS — D631 Anemia in chronic kidney disease: Secondary | ICD-10-CM | POA: Diagnosis not present

## 2020-11-12 DIAGNOSIS — D509 Iron deficiency anemia, unspecified: Secondary | ICD-10-CM | POA: Diagnosis not present

## 2020-11-13 ENCOUNTER — Encounter: Payer: Self-pay | Admitting: Internal Medicine

## 2020-11-13 ENCOUNTER — Ambulatory Visit: Payer: Medicare Other | Admitting: Family Medicine

## 2020-11-13 ENCOUNTER — Ambulatory Visit (INDEPENDENT_AMBULATORY_CARE_PROVIDER_SITE_OTHER): Payer: Medicare Other

## 2020-11-13 ENCOUNTER — Ambulatory Visit (INDEPENDENT_AMBULATORY_CARE_PROVIDER_SITE_OTHER): Payer: Medicare Other | Admitting: Internal Medicine

## 2020-11-13 VITALS — BP 156/64 | HR 56 | Temp 97.9°F | Ht 64.0 in

## 2020-11-13 DIAGNOSIS — R262 Difficulty in walking, not elsewhere classified: Secondary | ICD-10-CM | POA: Diagnosis not present

## 2020-11-13 DIAGNOSIS — M85871 Other specified disorders of bone density and structure, right ankle and foot: Secondary | ICD-10-CM | POA: Diagnosis not present

## 2020-11-13 DIAGNOSIS — M79671 Pain in right foot: Secondary | ICD-10-CM

## 2020-11-13 DIAGNOSIS — I6523 Occlusion and stenosis of bilateral carotid arteries: Secondary | ICD-10-CM

## 2020-11-13 DIAGNOSIS — N186 End stage renal disease: Secondary | ICD-10-CM | POA: Diagnosis not present

## 2020-11-13 DIAGNOSIS — M79672 Pain in left foot: Secondary | ICD-10-CM

## 2020-11-13 DIAGNOSIS — M7732 Calcaneal spur, left foot: Secondary | ICD-10-CM | POA: Diagnosis not present

## 2020-11-13 DIAGNOSIS — Z8739 Personal history of other diseases of the musculoskeletal system and connective tissue: Secondary | ICD-10-CM

## 2020-11-13 DIAGNOSIS — I739 Peripheral vascular disease, unspecified: Secondary | ICD-10-CM | POA: Diagnosis not present

## 2020-11-13 DIAGNOSIS — Z992 Dependence on renal dialysis: Secondary | ICD-10-CM

## 2020-11-13 DIAGNOSIS — M7989 Other specified soft tissue disorders: Secondary | ICD-10-CM | POA: Diagnosis not present

## 2020-11-13 DIAGNOSIS — M19071 Primary osteoarthritis, right ankle and foot: Secondary | ICD-10-CM | POA: Diagnosis not present

## 2020-11-13 NOTE — Patient Instructions (Signed)
Will do  A referral for podiatry   Getting x ray today to check for arthritis .  Not sure if gout or other cause such as plantar fascitis  .    ( heel spur)   Will let Dr  [name] know    Can try topical  Antiinflammatory     Plantar Fasciitis  Plantar fasciitis is a painful foot condition that affects the heel. It occurs when the band of tissue that connects the toes to the heel bone (plantar fascia) becomes irritated. This can happen as the result of exercising too much or doing other repetitive activities (overuse injury). Plantar fasciitis can cause mild irritation to severe pain that makes it difficult to walk or move. The pain is usually worse in the morning after sleeping, or after sitting or lying down for a period of time. Pain may also be worse after long periods of walking or standing. What are the causes? This condition may be caused by:  Standing for long periods of time.  Wearing shoes that do not have good arch support.  Doing activities that put stress on joints (high-impact activities). This includes ballet and exercise that makes your heart beat faster (aerobic exercise), such as running.  Being overweight.  An abnormal way of walking (gait).  Tight muscles in the back of your lower leg (calf).  High arches in your feet or flat feet.  Starting a new athletic activity. What are the signs or symptoms? The main symptom of this condition is heel pain. Pain may get worse after the following:  Taking the first steps after a time of rest, especially in the morning after awakening, or after you have been sitting or lying down for a while.  Long periods of standing still. Pain may decrease after 30-45 minutes of activity, such as gentle walking. How is this diagnosed? This condition may be diagnosed based on your medical history, a physical exam, and your symptoms. Your health care provider will check for:  A tender area on the bottom of your foot.  A high arch in your  foot or flat feet.  Pain when you move your foot.  Difficulty moving your foot. You may have imaging tests to confirm the diagnosis, such as:  X-rays.  Ultrasound.  MRI. How is this treated? Treatment for plantar fasciitis depends on how severe your condition is. Treatment may include:  Rest, ice, pressure (compression), and raising (elevating) the affected foot. This is called RICE therapy. Your health care provider may recommend RICE therapy along with over-the-counter pain medicines to manage your pain.  Exercises to stretch your calves and your plantar fascia.  A splint that holds your foot in a stretched, upward position while you sleep (night splint).  Physical therapy to relieve symptoms and prevent problems in the future.  Injections of steroid medicine (cortisone) to relieve pain and inflammation.  Stimulating your plantar fascia with electrical impulses (extracorporeal shock wave therapy). This is usually the last treatment option before surgery.  Surgery, if other treatments have not worked after 12 months. Follow these instructions at home: Managing pain, stiffness, and swelling  If directed, put ice on the painful area. To do this: ? Put ice in a plastic bag, or use a frozen bottle of water. ? Place a towel between your skin and the bag or bottle. ? Roll the bottom of your foot over the bag or bottle. ? Do this for 20 minutes, 2-3 times a day.  Wear athletic shoes that  have air-sole or gel-sole cushions, or try soft shoe inserts that are designed for plantar fasciitis.  Elevate your foot above the level of your heart while you are sitting or lying down.   Activity  Avoid activities that cause pain. Ask your health care provider what activities are safe for you.  Do physical therapy exercises and stretches as told by your health care provider.  Try activities and forms of exercise that are easier on your joints (low impact). Examples include swimming, water  aerobics, and biking. General instructions  Take over-the-counter and prescription medicines only as told by your health care provider.  Wear a night splint while sleeping, if told by your health care provider. Loosen the splint if your toes tingle, become numb, or turn cold and blue.  Maintain a healthy weight, or work with your health care provider to lose weight as needed.  Keep all follow-up visits. This is important. Contact a health care provider if you have:  Symptoms that do not go away with home treatment.  Pain that gets worse.  Pain that affects your ability to move or do daily activities. Summary  Plantar fasciitis is a painful foot condition that affects the heel. It occurs when the band of tissue that connects the toes to the heel bone (plantar fascia) becomes irritated.  Heel pain is the main symptom of this condition. It may get worse after exercising too much or standing still for a long time.  Treatment varies, but it usually starts with rest, ice, pressure (compression), and raising (elevating) the affected foot. This is called RICE therapy. Over-the-counter medicines can also be used to manage pain. This information is not intended to replace advice given to you by your health care provider. Make sure you discuss any questions you have with your health care provider. Document Revised: 10/24/2019 Document Reviewed: 10/24/2019 Elsevier Patient Education  2021 [employer].

## 2020-11-13 NOTE — Progress Notes (Signed)
Chief Complaint  Patient presents with  . Gout    HPI: Jane [name] 75 y.o. come in for problem with feet pain recently cannot walk.  Long time  Problem with foot.   "Cant walk "  Today  Thinks it could be gout based ont he fact she had gout dx years ago with severe foot pain   No dx recently no injury but off and on problem in past weeks     Right foot  With pain  Mid foot  Medial    And  Left foot pain near plantar surface of heal and medical ankles   Not tender to touch but deeper and if moves and tries to walk  . Uses  Soft croc shoes  Liver alone daughter with her today    Last week    recent flare about 5 days ago and off and on now worse   No fever .  Swelling from norvasc.  Every other week. Seemed to wax and wane .   Has been on dialysis for about 10 years  Stable  ROS: See pertinent positives and negatives per HPI.  Past Medical History:  Diagnosis Date  . Acid reflux   . Anemia of chronic disease   . Arthritis   . Asthma   . Atrial fibrillation ([place])   . Bilateral carotid bruits   . Complication of anesthesia    "hard to wake up, I have sleep apnea" no CPAP  . Depression   . Diverticulitis   . Duodenal ulcer   . Dysrhythmia    Afib  . ESRD (end stage renal disease) ([place])    MWF [place]  . Headache   . History of blood transfusion   . Hypertension   . Malaise and fatigue   . Orthostatic hypotension   . Shortness of breath    " when I walk to fast"  . Sleep apnea   . Syncope   . Tubulovillous adenoma of colon     Family History  Problem Relation Age of Onset  . Heart failure Mother   . Stroke Mother   . Other Father   . Colon cancer Neg Hx   . Liver disease Neg Hx   . Esophageal cancer Neg Hx   . Stomach cancer Neg Hx   . Inflammatory bowel disease Neg Hx   . Rectal cancer Neg Hx   . Pancreatic cancer Neg Hx     Social History   Socioeconomic History  . Marital status: Divorced    Spouse name: Not on file  . Number of children: 4  .  Years of education: 73  . Highest education level: Associate degree: occupational, Hotel manager, or vocational program  Occupational History  . Occupation: Retired  Tobacco Use  . Smoking status: Never Smoker  . Smokeless tobacco: Never Used  Vaping Use  . Vaping Use: Never used  Substance and Sexual Activity  . Alcohol use: No  . Drug use: No  . Sexual activity: Never  Other Topics Concern  . Not on file  Social History Narrative   HH 1   Divorced   Outpatient dialysis Mon, Wed, Fri   4 children: 1 daughter locally is an Designer, multimedia and 3 sons in [place] Determinants of Health   Financial Resource Strain: [place]   . Difficulty of Paying Living Expenses: Not hard at all  Food Insecurity: No Food Insecurity  . Worried About [employer] of  Food in the Last Year: Never true  . Ran Out of Food in the Last Year: Never true  Transportation Needs: No Transportation Needs  . Lack of Transportation (Medical): No  . Lack of Transportation (Non-Medical): No  Physical Activity: Inactive  . Days of Exercise per Week: 0 days  . Minutes of Exercise per Session: 0 min  Stress: Stress Concern Present  . Feeling of Stress : To some extent  Social Connections: Moderately Isolated  . Frequency of Communication with Friends and Family: More than three times a week  . Frequency of Social Gatherings with Friends and Family: Never  . Attends Religious Services: More than 4 times per year  . Active Member of Clubs or Organizations: No  . Attends Archivist Meetings: Never  . Marital Status: Divorced    Outpatient Medications Prior to Visit  Medication Sig Dispense Refill  . acetaminophen (TYLENOL) 500 MG tablet Take 1,500 mg by mouth daily as needed for headache.    . albuterol (PROVENTIL HFA;VENTOLIN HFA) 108 (90 Base) MCG/ACT inhaler Inhale 1 puff into the lungs every 6 (six) hours as needed for wheezing or shortness of breath. (Patient taking differently: Inhale 2 puffs  into the lungs every 6 (six) hours as needed for wheezing or shortness of breath.) 6.7 g 2  . amLODipine (NORVASC) 5 MG tablet Take 5 mg by mouth in the morning and at bedtime.    . B Complex-C-Zn-Folic Acid (DIALYVITE 277 WITH ZINC) 0.8 MG TABS Take 1 tablet by mouth daily.    . carvedilol (COREG) 25 MG tablet Take 1 tablet (25 mg total) by mouth 2 (two) times daily with a meal. 180 tablet 3  . DULoxetine (CYMBALTA) 20 MG capsule Take 2 capsules (40 mg total) by mouth daily. 180 capsule 2  . ethyl chloride spray Apply 1 application topically daily as needed (port access).   12  . hydrALAZINE (APRESOLINE) 25 MG tablet Take 1 tablet (25 mg total) by mouth 3 (three) times daily. 270 tablet 2  . HYDROcodone-acetaminophen (NORCO/VICODIN) 5-325 MG tablet Take 1 tablet by mouth every 12 (twelve) hours as needed for moderate pain. 60 tablet 0  . isosorbide dinitrate (ISORDIL) 30 MG tablet Take 1 tablet (30 mg total) by mouth 3 (three) times daily. 270 tablet 2  . LINZESS 145 MCG CAPS capsule TAKE 1 CAPSULE BY MOUTH ONCE DAILY BEFORE  BREAKFAST 30 capsule 0  . omeprazole (PRILOSEC) 40 MG capsule TAKE 1 CAPSULE BY MOUTH TWICE DAILY BEFORE A MEAL (Patient taking differently: Take 40 mg by mouth 2 (two) times daily.) 60 capsule 0  . warfarin (COUMADIN) 5 MG tablet USE AS DIRECTED BY COUMADIN CLINIC 113 tablet 5  . Methoxy PEG-Epoetin Beta (MIRCERA IJ) Mircera    . ondansetron (ZOFRAN) 4 MG tablet Take 4 mg by mouth every 8 (eight) hours as needed for nausea or vomiting.    . sucralfate (CARAFATE) 1 GM/10ML suspension Take 10 mLs (1 g total) by mouth 2 (two) times daily. 420 mL 1   Facility-Administered Medications Prior to Visit  Medication Dose Route Frequency Provider Last Rate Last Admin  . 0.9 %  sodium chloride infusion  250 mL Intravenous PRN Marty Heck, MD      . sodium chloride flush (NS) 0.9 % injection 3 mL  3 mL Intravenous Q12H Marty Heck, MD      . sodium chloride flush (NS)  0.9 % injection 3 mL  3 mL Intravenous PRN Monica Martinez  J, MD         EXAM:  BP (!) 156/64 (BP Location: Left Arm, Patient Position: Sitting, Cuff Size: Normal)   Pulse (!) 56   Temp 97.9 F (36.6 C) (Oral)   Ht 5\' 4"  (1.626 m)   SpO2 97%   BMI 37.25 kg/m   Body mass index is 37.25 kg/m.  GENERAL: vitals reviewed and listed above, alert, oriented, appears well hydrated and in no acute distress in WC  HEENT: atraumatic, conjunctiva  clear, no obvious abnormalities on inspection of external nose and ears OP masked NECK: no obvious masses on inspection palpation  LUNGS: no resp distress  CV: no clubbing cyanosis 1+ edema puffy  peripheral edema nl cap refill  MS: moves all extremities without noticeable focal  Abnormality  But  Left foot tender at plantar fascial insertion and right at medial mtp and f arch forefoot   Has crocks  PSYCH: pleasant and cooperative, cognition grossly intact  Lab Results  Component Value Date   WBC 6.0 01/19/2020   HGB 13.6 10/18/2020   HCT 40.0 10/18/2020   PLT 171 01/19/2020   GLUCOSE 98 10/18/2020   CHOL 198 10/06/2019   TRIG 51 10/06/2019   HDL 53 10/06/2019   LDLCALC 135 (H) 10/06/2019   ALT 12 01/17/2020   AST 12 (L) 01/17/2020   NA 141 10/18/2020   K 3.9 10/18/2020   CL 101 10/18/2020   CREATININE 6.60 (H) 10/18/2020   BUN 22 10/18/2020   CO2 26 01/18/2020   TSH 0.351 01/13/2020   INR 2.9 11/08/2020   BP Readings from Last 3 Encounters:  11/13/20 (!) 156/64  10/30/20 132/60  10/23/20 110/70    ASSESSMENT AND PLAN:  Discussed the following assessment and plan:  Foot pain, bilateral - Plan: DG Foot Complete Right, DG Foot Complete Left  History of gout - Plan: DG Foot Complete Right, DG Foot Complete Left  Chronic kidney disease requiring chronic dialysis ([place]) - Plan: DG Foot Complete Right, DG Foot Complete Left  atypical for gout   But severity keeps from walking    X ray today and podiatry referral   May be severe  PF? Arthritis ?  revewied can try topicals short term  -Patient advised to return or notify health care team  if  new concerns arise.  Patient Instructions   Will do  A referral for podiatry   Getting x ray today to check for arthritis .  Not sure if gout or other cause such as plantar fascitis  .    ( heel spur)   Will let Dr  [name] know    Can try topical  Antiinflammatory     Plantar Fasciitis  Plantar fasciitis is a painful foot condition that affects the heel. It occurs when the band of tissue that connects the toes to the heel bone (plantar fascia) becomes irritated. This can happen as the result of exercising too much or doing other repetitive activities (overuse injury). Plantar fasciitis can cause mild irritation to severe pain that makes it difficult to walk or move. The pain is usually worse in the morning after sleeping, or after sitting or lying down for a period of time. Pain may also be worse after long periods of walking or standing. What are the causes? This condition may be caused by:  Standing for long periods of time.  Wearing shoes that do not have good arch support.  Doing activities that put stress on joints (high-impact activities).  This includes ballet and exercise that makes your heart beat faster (aerobic exercise), such as running.  Being overweight.  An abnormal way of walking (gait).  Tight muscles in the back of your lower leg (calf).  High arches in your feet or flat feet.  Starting a new athletic activity. What are the signs or symptoms? The main symptom of this condition is heel pain. Pain may get worse after the following:  Taking the first steps after a time of rest, especially in the morning after awakening, or after you have been sitting or lying down for a while.  Long periods of standing still. Pain may decrease after 30-45 minutes of activity, such as gentle walking. How is this diagnosed? This condition may be diagnosed based on  your medical history, a physical exam, and your symptoms. Your health care provider will check for:  A tender area on the bottom of your foot.  A high arch in your foot or flat feet.  Pain when you move your foot.  Difficulty moving your foot. You may have imaging tests to confirm the diagnosis, such as:  X-rays.  Ultrasound.  MRI. How is this treated? Treatment for plantar fasciitis depends on how severe your condition is. Treatment may include:  Rest, ice, pressure (compression), and raising (elevating) the affected foot. This is called RICE therapy. Your health care provider may recommend RICE therapy along with over-the-counter pain medicines to manage your pain.  Exercises to stretch your calves and your plantar fascia.  A splint that holds your foot in a stretched, upward position while you sleep (night splint).  Physical therapy to relieve symptoms and prevent problems in the future.  Injections of steroid medicine (cortisone) to relieve pain and inflammation.  Stimulating your plantar fascia with electrical impulses (extracorporeal shock wave therapy). This is usually the last treatment option before surgery.  Surgery, if other treatments have not worked after 12 months. Follow these instructions at home: Managing pain, stiffness, and swelling  If directed, put ice on the painful area. To do this: ? Put ice in a plastic bag, or use a frozen bottle of water. ? Place a towel between your skin and the bag or bottle. ? Roll the bottom of your foot over the bag or bottle. ? Do this for 20 minutes, 2-3 times a day.  Wear athletic shoes that have air-sole or gel-sole cushions, or try soft shoe inserts that are designed for plantar fasciitis.  Elevate your foot above the level of your heart while you are sitting or lying down.   Activity  Avoid activities that cause pain. Ask your health care provider what activities are safe for you.  Do physical therapy exercises and  stretches as told by your health care provider.  Try activities and forms of exercise that are easier on your joints (low impact). Examples include swimming, water aerobics, and biking. General instructions  Take over-the-counter and prescription medicines only as told by your health care provider.  Wear a night splint while sleeping, if told by your health care provider. Loosen the splint if your toes tingle, become numb, or turn cold and blue.  Maintain a healthy weight, or work with your health care provider to lose weight as needed.  Keep all follow-up visits. This is important. Contact a health care provider if you have:  Symptoms that do not go away with home treatment.  Pain that gets worse.  Pain that affects your ability to move or do daily activities. Summary  Plantar fasciitis is a painful foot condition that affects the heel. It occurs when the band of tissue that connects the toes to the heel bone (plantar fascia) becomes irritated.  Heel pain is the main symptom of this condition. It may get worse after exercising too much or standing still for a long time.  Treatment varies, but it usually starts with rest, ice, pressure (compression), and raising (elevating) the affected foot. This is called RICE therapy. Over-the-counter medicines can also be used to manage pain. This information is not intended to replace advice given to you by your health care provider. Make sure you discuss any questions you have with your health care provider. Document Revised: 10/24/2019 Document Reviewed: 10/24/2019 Elsevier Patient Education  2021 Macy K. Krishawna Stiefel M.D.

## 2020-11-14 DIAGNOSIS — N2581 Secondary hyperparathyroidism of renal origin: Secondary | ICD-10-CM | POA: Diagnosis not present

## 2020-11-14 DIAGNOSIS — D631 Anemia in chronic kidney disease: Secondary | ICD-10-CM | POA: Diagnosis not present

## 2020-11-14 DIAGNOSIS — N186 End stage renal disease: Secondary | ICD-10-CM | POA: Diagnosis not present

## 2020-11-14 DIAGNOSIS — Z992 Dependence on renal dialysis: Secondary | ICD-10-CM | POA: Diagnosis not present

## 2020-11-14 DIAGNOSIS — D509 Iron deficiency anemia, unspecified: Secondary | ICD-10-CM | POA: Diagnosis not present

## 2020-11-15 ENCOUNTER — Ambulatory Visit (INDEPENDENT_AMBULATORY_CARE_PROVIDER_SITE_OTHER): Payer: Medicare Other | Admitting: Podiatry

## 2020-11-15 ENCOUNTER — Other Ambulatory Visit: Payer: Self-pay

## 2020-11-15 DIAGNOSIS — M76821 Posterior tibial tendinitis, right leg: Secondary | ICD-10-CM | POA: Diagnosis not present

## 2020-11-15 DIAGNOSIS — M722 Plantar fascial fibromatosis: Secondary | ICD-10-CM

## 2020-11-15 MED ORDER — METHYLPREDNISOLONE 4 MG PO TBPK: ORAL_TABLET | ORAL | 0 refills | Status: DC

## 2020-11-15 NOTE — Patient Instructions (Signed)
Posterior Tibial Tendinitis  Posterior tibial tendinitis is irritation of a tendon called the posterior tibial tendon. Your posterior tibial tendon is a cord-like tissue that connects bones of your lower leg and foot to a muscle that: 1. Supports your arch. 2. Helps you raise up on your toes. 3. Helps you turn your foot down and in. This condition causes foot and ankle pain. It can also lead to a flat foot. What are the causes? This condition is most often caused by repeated stress to the tendon (overuse injury). It can also be caused by a sudden injury that stresses the tendon, such as landing on your foot after jumping or falling. What increases the risk? This condition is more likely to develop in: 1. People who play a sport that involves putting a lot of pressure on the feet, such as: 1. Basketball. 2. Tennis. 3. Soccer. 4. Hockey. 2. Runners. 3. Females who are older than 77 years of age and are overweight. 4. People with diabetes. 5. People with decreased foot stability. 6. People with flat feet. What are the signs or symptoms? Symptoms include: 1. Pain in the inner ankle. 2. Pain at the arch of your foot. 3. Pain that gets worse with running, walking, or standing. 4. Swelling on the inside of your ankle and foot. 5. Weakness in your ankle or foot. 6. Inability to stand up on tiptoe. 7. Flattening of the arch of your foot. How is this diagnosed? This condition may be diagnosed based on: 1. Your symptoms. 2. Your medical history. 3. A physical exam. 4. Tests, such as: 1. X-ray. 2. MRI. 3. Ultrasound. How is this treated? This condition may be treated by: 1. Putting ice to the injured area. 2. Taking NSAIDs, such as ibuprofen, to reduce pain and swelling. 3. Wearing a special shoe or shoe insert to support your arch (orthotic). 4. Having physical therapy. 5. Replacing high-impact exercise with low-impact exercise, such as swimming or cycling. If your symptoms do not  improve with these treatments, you may need to wear a splint, removable walking boot, or short leg cast for 6-8 weeks to keep your foot and ankle still (immobilized). Follow these instructions at home: If you have a cast, splint, or boot:  Keep it clean and dry.  Check the skin around it every day. Tell your health care provider about any concerns. If you have a cast:  Do not stick anything inside it to scratch your skin. Doing that increases your risk of infection.  You may put lotion on dry skin around the edges of the cast. Do not put lotion on the skin underneath the cast. If you have a splint or boot:  Wear it as told by your health care provider. Remove it only as told by your health care provider.  Loosen it if your toes tingle, become numb, or turn cold and blue. Bathing 1. Do not take baths, swim, or use a hot tub until your health care provider approves. Ask your health care provider if you may take showers. 2. If your cast, splint, or boot is not waterproof: ? Do not let it get wet. ? Cover it with a waterproof covering while you take a bath or a shower. Managing pain and swelling   1. If directed, put ice on the injured area. ? If you have a removable splint or boot, remove it as told by your health care provider. ? Put ice in a plastic bag. ? Place a towel between your   skin and the bag or between your cast and the bag. ? Leave the ice on for 20 minutes, 2-3 times a day. 2. Move your toes often to reduce stiffness and swelling. 3. Raise (elevate) the injured area above the level of your heart while you are sitting or lying down. Activity  Do not use the injured foot to support your body weight until your health care provider says that you can. Use crutches as told by your health care provider.  Do not do activities that make pain or swelling worse.  Ask your health care provider when it is safe to drive if you have a cast, splint, or boot on your foot.  Return to  your normal activities as told by your health care provider. Ask your health care provider what activities are safe for you.  Do exercises as told by your health care provider. General instructions  Take over-the-counter and prescription medicines only as told by your health care provider.  If you have an orthotic, use it as told by your health care provider.  Keep all follow-up visits as told by your health care provider. This is important. How is this prevented?  Wear footwear that is appropriate to your athletic activity.  Avoid athletic activities that cause pain or swelling in your ankle or foot.  Before being active, do range-of-motion and stretching exercises.  If you develop pain or swelling while training, stop training.  If you have pain or swelling that does not improve after a few days of rest, see your health care provider.  If you start a new athletic activity, start gradually so you can build up your strength and flexibility. Contact a health care provider if:  Your symptoms get worse.  Your symptoms do not improve in 6-8 weeks.  You develop new, unexplained symptoms.  Your splint, boot, or cast gets damaged. Summary  Posterior tibial tendinitis is irritation of a tendon called the posterior tibial tendon.  This condition is most often caused by repeated stress to the tendon (overuse injury).  This condition causes foot pain and ankle pain. It can also lead to a flat foot.  This condition may be treated by not doing high-impact activities, applying ice, having physical therapy, wearing orthotics, and wearing a cast, splint, or boot if needed. This information is not intended to replace advice given to you by your health care provider. Make sure you discuss any questions you have with your health care provider. Document Revised: 11/02/2018 Document Reviewed: 09/09/2018 [employer] Patient Education  2020 [employer]  Posterior Tibial Tendinitis Rehab Ask  your health care provider which exercises are safe for you. Do exercises exactly as told by your health care provider and adjust them as directed. It is normal to feel mild stretching, pulling, tightness, or discomfort as you do these exercises. Stop right away if you feel sudden pain or your pain gets worse. Do not begin these exercises until told by your health care provider. Stretching and range-of-motion exercises These exercises warm up your muscles and joints and improve the movement and flexibility in your ankle and foot. These exercises may also help to relieve pain. Standing wall calf stretch, knee straight   4. Stand with your hands against a wall. 5. Extend your left / right leg behind you, and bend your front knee slightly. If directed, place a folded washcloth under the arch of your foot for support. 6. Point the toes of your back foot slightly inward. 7. Keeping your heels   on the floor and your back knee straight, shift your weight toward the wall. Do not allow your back to arch. You should feel a gentle stretch in your upper left / right calf. 8. Hold this position for 10 seconds. Repeat 10 times. Complete this exercise 2 times a day. Standing wall calf stretch, knee bent 7. Stand with your hands against a wall. 8. Extend your left / right leg behind you, and bend your front knee slightly. If directed, place a folded washcloth under the arch of your foot for support. 9. Point the toes of your back foot slightly inward. 10. Unlock your back knee so it is bent. Keep your heels on the floor. You should feel a gentle stretch deep in your lower left / right calf. 11. Hold this position for 10 seconds. Repeat 10 times. Complete this exercise 2 times a day. Strengthening exercises These exercises build strength and endurance in your ankle and foot. Endurance is the ability to use your muscles for a long time, even after they get tired. Ankle inversion with band 8. Secure one end of a  rubber exercise band or tubing to a fixed object, such as a table leg or a pole, that will stay still when the band is pulled. 9. Loop the other end of the band around the middle of your left / right foot. 10. Sit on the floor facing the object with your left / right leg extended. The band or tube should be slightly tense when your foot is relaxed. 11. Leading with your big toe, slowly bring your left / right foot and ankle inward, toward your other foot (inversion). 12. Hold this position for 10 seconds. 13. Slowly return your foot to the starting position. Repeat 10 times. Complete this exercise 2 times a day. Towel curls   5. Sit in a chair on a non-carpeted surface, and put your feet on the floor. 6. Place a towel in front of your feet. 7. Keeping your heel on the floor, put your left / right foot on the towel. 8. Pull the towel toward you by grabbing the towel with your toes and curling them under. Keep your heel on the floor while you do this. 9. Let your toes relax. 10. Grab the towel with your toes again. Keep going until the towel is completely underneath your foot. Repeat 10 times. Complete this exercise 2 times a day. Balance exercise This exercise improves or maintains your balance. Balance is important in preventing falls. Single leg stand 6. Without wearing shoes, stand near a railing or in a doorway. You may hold on to the railing or door frame as needed for balance. 7. Stand on your left / right foot. Keep your big toe down on the floor and try to keep your arch lifted. ? If balancing in this position is too easy, try the exercise with your eyes closed or while standing on a pillow. 8. Hold this position for 10 seconds. Repeat 10 times. Complete this exercise 2 times a day. This information is not intended to replace advice given to you by your health care provider. Make sure you discuss any questions you have with your health care provider.  

## 2020-11-15 NOTE — Progress Notes (Signed)
No alarming findings in the feet there is some arthritis in the main ankle joint of your right foot.  No fractures are noted.  I have made the referral to podiatry and further action would be through your PCP Dr. [name].  There is also a small heel spur and evidence that your blood vessels have some calcium buildup which could be from your renal disease and may or may not be important to your foot pain.

## 2020-11-15 NOTE — Progress Notes (Signed)
No alarming findings in the feet there is some arthritis in the main ankle joint of your right foot.  No fractures are noted.  I have made the referral to podiatry and further action would be through your PCP Dr. [name].

## 2020-11-16 DIAGNOSIS — N2581 Secondary hyperparathyroidism of renal origin: Secondary | ICD-10-CM | POA: Diagnosis not present

## 2020-11-16 DIAGNOSIS — N186 End stage renal disease: Secondary | ICD-10-CM | POA: Diagnosis not present

## 2020-11-16 DIAGNOSIS — D509 Iron deficiency anemia, unspecified: Secondary | ICD-10-CM | POA: Diagnosis not present

## 2020-11-16 DIAGNOSIS — Z992 Dependence on renal dialysis: Secondary | ICD-10-CM | POA: Diagnosis not present

## 2020-11-16 DIAGNOSIS — D631 Anemia in chronic kidney disease: Secondary | ICD-10-CM | POA: Diagnosis not present

## 2020-11-17 ENCOUNTER — Encounter: Payer: Self-pay | Admitting: Podiatry

## 2020-11-17 NOTE — Progress Notes (Signed)
  Subjective:  [name], female    DOB: 1946-05-17,  MRN: [id number]  Chief Complaint  Patient presents with  . Foot Pain    Bilateral foot pain and swelling     75 y.o. female presents with the above complaint. History confirmed with patient.  Pain is both on the bottom and the top of the foot mostly in the inside of the left arch and bottom of the right heel.  It comes and goes and has a burning sensation.  Objective:  Physical Exam: warm, good capillary refill, no trophic changes or ulcerative lesions, normal DP and PT pulses and normal sensory exam. Left Foot: Pain on palpation at the insertion of the posterior tibial tendon on the navicular and with resisted inversion Right Foot: point tenderness over the heel pad   No images are attached to the encounter.  Radiographs: X-ray of both feet reviewed: no fracture, dislocation, swelling or degenerative changes noted Assessment:   1. Plantar fasciitis of right foot   2. Posterior tibial tendinitis of right lower extremity      Plan:  Patient was evaluated and treated and all questions answered.  Discussed the etiology and treatment options for plantar fasciitis and posterior tibial tendinitis including stretching, formal physical therapy, supportive shoegears such as a running shoe or sneaker, pre fabricated orthoses, injection therapy, and oral medications. We also discussed the role of surgical treatment of this for patients who do not improve after exhausting non-surgical treatment options.  -XR reviewed with patient -Educated patient on stretching and icing of the affected limb -Plantar fascial brace dispensed for the right foot and Tri-Lock ankle brace for the left foot -Injection delivered to the plantar fascia of the right foot. -Rx for medrol pack. Educated on use, risks, and benefits of the medication  After sterile prep with povidone-iodine solution and alcohol, the right heel was injected with 0.5cc 2%  xylocaine plain, 0.5cc 0.5% marcaine plain, 5mg  triamcinolone acetonide, and 2mg  dexamethasone was injected along  the plantar fascia at the insertion on the plantar calcaneus. The patient tolerated the procedure well without complication.   Return in about 6 weeks (around 12/27/2020).

## 2020-11-18 DIAGNOSIS — Z992 Dependence on renal dialysis: Secondary | ICD-10-CM | POA: Diagnosis not present

## 2020-11-18 DIAGNOSIS — N186 End stage renal disease: Secondary | ICD-10-CM | POA: Diagnosis not present

## 2020-11-18 DIAGNOSIS — I158 Other secondary hypertension: Secondary | ICD-10-CM | POA: Diagnosis not present

## 2020-11-19 DIAGNOSIS — N186 End stage renal disease: Secondary | ICD-10-CM | POA: Diagnosis not present

## 2020-11-19 DIAGNOSIS — I480 Paroxysmal atrial fibrillation: Secondary | ICD-10-CM | POA: Diagnosis not present

## 2020-11-19 DIAGNOSIS — Z992 Dependence on renal dialysis: Secondary | ICD-10-CM | POA: Diagnosis not present

## 2020-11-19 DIAGNOSIS — N2581 Secondary hyperparathyroidism of renal origin: Secondary | ICD-10-CM | POA: Diagnosis not present

## 2020-11-19 DIAGNOSIS — D631 Anemia in chronic kidney disease: Secondary | ICD-10-CM | POA: Diagnosis not present

## 2020-11-20 ENCOUNTER — Other Ambulatory Visit (INDEPENDENT_AMBULATORY_CARE_PROVIDER_SITE_OTHER): Payer: Medicare Other

## 2020-11-20 ENCOUNTER — Telehealth: Payer: Self-pay

## 2020-11-20 DIAGNOSIS — M76822 Posterior tibial tendinitis, left leg: Secondary | ICD-10-CM | POA: Diagnosis not present

## 2020-11-20 DIAGNOSIS — R531 Weakness: Secondary | ICD-10-CM | POA: Diagnosis not present

## 2020-11-20 DIAGNOSIS — D132 Benign neoplasm of duodenum: Secondary | ICD-10-CM

## 2020-11-20 DIAGNOSIS — D1330 Benign neoplasm of unspecified part of small intestine: Secondary | ICD-10-CM

## 2020-11-20 DIAGNOSIS — K5909 Other constipation: Secondary | ICD-10-CM | POA: Diagnosis not present

## 2020-11-20 DIAGNOSIS — M25572 Pain in left ankle and joints of left foot: Secondary | ICD-10-CM | POA: Diagnosis not present

## 2020-11-20 DIAGNOSIS — R1013 Epigastric pain: Secondary | ICD-10-CM

## 2020-11-20 DIAGNOSIS — R262 Difficulty in walking, not elsewhere classified: Secondary | ICD-10-CM | POA: Diagnosis not present

## 2020-11-20 DIAGNOSIS — M722 Plantar fascial fibromatosis: Secondary | ICD-10-CM | POA: Diagnosis not present

## 2020-11-20 DIAGNOSIS — M25571 Pain in right ankle and joints of right foot: Secondary | ICD-10-CM | POA: Diagnosis not present

## 2020-11-20 LAB — COMPREHENSIVE METABOLIC PANEL
ALT: 10 U/L (ref 0–35)
AST: 12 U/L (ref 0–37)
Albumin: 4.1 g/dL (ref 3.5–5.2)
Alkaline Phosphatase: 106 U/L (ref 39–117)
BUN: 49 mg/dL — ABNORMAL HIGH (ref 6–23)
CO2: 26 mEq/L (ref 19–32)
Calcium: 10.1 mg/dL (ref 8.4–10.5)
Chloride: 99 mEq/L (ref 96–112)
Creatinine, Ser: 7.57 mg/dL (ref 0.40–1.20)
GFR: 4.82 mL/min — CL (ref >60.00)
Glucose, Bld: 91 mg/dL (ref 70–99)
Potassium: 3.6 mEq/L (ref 3.5–5.1)
Sodium: 139 mEq/L (ref 135–145)
Total Bilirubin: 0.7 mg/dL (ref 0.2–1.2)
Total Protein: 8.3 g/dL (ref 6.0–8.3)

## 2020-11-20 LAB — CBC
HCT: 35.7 % — ABNORMAL LOW (ref 36.0–46.0)
Hemoglobin: 11.7 g/dL — ABNORMAL LOW (ref 12.0–15.0)
MCHC: 32.8 g/dL (ref 30.0–36.0)
MCV: 84.6 fl (ref 78.0–100.0)
Platelets: 196 10*3/uL (ref 150.0–400.0)
RBC: 4.22 Mil/uL (ref 3.87–5.11)
RDW: 19.2 % — ABNORMAL HIGH (ref 11.5–15.5)
WBC: 6.5 10*3/uL (ref 4.0–10.5)

## 2020-11-20 LAB — CORTISOL: Cortisol, Plasma: 8.3 ug/dL

## 2020-11-20 LAB — TSH: TSH: 1.36 u[IU]/mL (ref 0.35–4.50)

## 2020-11-20 NOTE — Telephone Encounter (Signed)
Critical lab result per lab : creat 7.57, GFR 4.82

## 2020-11-21 DIAGNOSIS — N2581 Secondary hyperparathyroidism of renal origin: Secondary | ICD-10-CM | POA: Diagnosis not present

## 2020-11-21 DIAGNOSIS — N186 End stage renal disease: Secondary | ICD-10-CM | POA: Diagnosis not present

## 2020-11-21 DIAGNOSIS — Z992 Dependence on renal dialysis: Secondary | ICD-10-CM | POA: Diagnosis not present

## 2020-11-21 DIAGNOSIS — D631 Anemia in chronic kidney disease: Secondary | ICD-10-CM | POA: Diagnosis not present

## 2020-11-21 NOTE — Telephone Encounter (Signed)
She is on dialysis so this would be expected. Thanks. GM

## 2020-11-22 ENCOUNTER — Ambulatory Visit: Payer: Medicare Other | Admitting: Pharmacist

## 2020-11-22 ENCOUNTER — Other Ambulatory Visit: Payer: Self-pay

## 2020-11-22 DIAGNOSIS — M25572 Pain in left ankle and joints of left foot: Secondary | ICD-10-CM | POA: Diagnosis not present

## 2020-11-22 DIAGNOSIS — Z7901 Long term (current) use of anticoagulants: Secondary | ICD-10-CM

## 2020-11-22 DIAGNOSIS — I480 Paroxysmal atrial fibrillation: Secondary | ICD-10-CM

## 2020-11-22 DIAGNOSIS — R531 Weakness: Secondary | ICD-10-CM | POA: Diagnosis not present

## 2020-11-22 DIAGNOSIS — M25571 Pain in right ankle and joints of right foot: Secondary | ICD-10-CM | POA: Diagnosis not present

## 2020-11-22 DIAGNOSIS — Z5181 Encounter for therapeutic drug level monitoring: Secondary | ICD-10-CM

## 2020-11-22 DIAGNOSIS — M76822 Posterior tibial tendinitis, left leg: Secondary | ICD-10-CM | POA: Diagnosis not present

## 2020-11-22 DIAGNOSIS — R262 Difficulty in walking, not elsewhere classified: Secondary | ICD-10-CM | POA: Diagnosis not present

## 2020-11-22 DIAGNOSIS — M722 Plantar fascial fibromatosis: Secondary | ICD-10-CM | POA: Diagnosis not present

## 2020-11-22 LAB — POCT INR: INR: 3.6 — AB (ref 2.0–3.0)

## 2020-11-22 NOTE — Progress Notes (Signed)
Anticoagulation Management Jane [name] is a 75 y.o. female who reports to the clinic for monitoring of warfarin treatment.    Indication: atrial fibrillation CHA2DS2 Vasc Score 4 (Age >33, female, HTN hx), HAS-BLED 2 (Age>65, renal disease)  Duration: indefinite Supervising physician: Adrian Prows  Anticoagulation Clinic Visit History:  Patient does not report signs/symptoms of bleeding or thromboembolism.  Other recent changes: Pt continues to have persistent chronic pain in her back, knee, and neck. Significantly affecting patient's QOLs and ADLs. Working with physical therapy to help address pain.   Recent start medrol dose pack for plantar fasciitis and tendonitis. Finishing course on ~11/24/20.   Pt has a scheduled EGD + EMR procedure on 11/29/20.   Anticoagulation Episode Summary    Current INR goal:  2.0-3.0  TTR:  66.1 % (2.2 y)  Next INR check:  12/06/2020  INR from last check:  3.6 (11/22/2020)  Weekly max warfarin dose:    Target end date:  Indefinite  INR check location:    Preferred lab:    Send INR reminders to:     Indications   Paroxysmal atrial fibrillation (HCC) [I48.0] Monitoring for long-term anticoagulant use [Z51.81 Z79.01]       Comments:          Allergies  Allergen Reactions  . Latex Rash  . Penicillins Other (See Comments)    Yeast infection / Childhood  . Sulfa Antibiotics Rash  . Tape Other (See Comments)    Plastic, silicone, and paper tape causes bruising and pulls off skin. Cloth tape works fine    Current Outpatient Medications:  .  acetaminophen (TYLENOL) 500 MG tablet, Take 1,500 mg by mouth daily as needed for headache., Disp: , Rfl:  .  albuterol (PROVENTIL HFA;VENTOLIN HFA) 108 (90 Base) MCG/ACT inhaler, Inhale 1 puff into the lungs every 6 (six) hours as needed for wheezing or shortness of breath. (Patient taking differently: Inhale 2 puffs into the lungs every 6 (six) hours as needed for wheezing or shortness of breath.), Disp: 6.7 g,  Rfl: 2 .  amLODipine (NORVASC) 5 MG tablet, Take 5 mg by mouth in the morning and at bedtime., Disp: , Rfl:  .  B Complex-C-Zn-Folic Acid (DIALYVITE 947 WITH ZINC) 0.8 MG TABS, Take 1 tablet by mouth daily., Disp: , Rfl:  .  carvedilol (COREG) 25 MG tablet, Take 1 tablet (25 mg total) by mouth 2 (two) times daily with a meal., Disp: 180 tablet, Rfl: 3 .  DULoxetine (CYMBALTA) 20 MG capsule, Take 2 capsules (40 mg total) by mouth daily., Disp: 180 capsule, Rfl: 2 .  ethyl chloride spray, Apply 1 application topically daily as needed (port access). , Disp: , Rfl: 12 .  hydrALAZINE (APRESOLINE) 25 MG tablet, Take 1 tablet (25 mg total) by mouth 3 (three) times daily., Disp: 270 tablet, Rfl: 2 .  HYDROcodone-acetaminophen (NORCO/VICODIN) 5-325 MG tablet, Take 1 tablet by mouth every 12 (twelve) hours as needed for moderate pain., Disp: 60 tablet, Rfl: 0 .  isosorbide dinitrate (ISORDIL) 30 MG tablet, Take 1 tablet (30 mg total) by mouth 3 (three) times daily., Disp: 270 tablet, Rfl: 2 .  LINZESS 145 MCG CAPS capsule, TAKE 1 CAPSULE BY MOUTH ONCE DAILY BEFORE  BREAKFAST, Disp: 30 capsule, Rfl: 0 .  methylPREDNISolone (MEDROL DOSEPAK) 4 MG TBPK tablet, 6 day dose pack - take as directed, Disp: 21 tablet, Rfl: 0 .  omeprazole (PRILOSEC) 40 MG capsule, TAKE 1 CAPSULE BY MOUTH TWICE DAILY BEFORE A MEAL (  Patient taking differently: Take 40 mg by mouth 2 (two) times daily.), Disp: 60 capsule, Rfl: 0 .  warfarin (COUMADIN) 5 MG tablet, USE AS DIRECTED BY COUMADIN CLINIC, Disp: 113 tablet, Rfl: 5  Current Facility-Administered Medications:  .  0.9 %  sodium chloride infusion, 250 mL, Intravenous, PRN, Marty Heck, MD .  sodium chloride flush (NS) 0.9 % injection 3 mL, 3 mL, Intravenous, Q12H, Marty Heck, MD .  sodium chloride flush (NS) 0.9 % injection 3 mL, 3 mL, Intravenous, PRN, Marty Heck, MD Past Medical History:  Diagnosis Date  . Acid reflux   . Anemia of chronic disease    . Arthritis   . Asthma   . Atrial fibrillation ([place])   . Bilateral carotid bruits   . Complication of anesthesia    "hard to wake up, I have sleep apnea" no CPAP  . Depression   . Diverticulitis   . Duodenal ulcer   . Dysrhythmia    Afib  . ESRD (end stage renal disease) ([place])    MWF [address]  . Headache   . History of blood transfusion   . Hypertension   . Malaise and fatigue   . Orthostatic hypotension   . Shortness of breath    " when I walk to fast"  . Sleep apnea   . Syncope   . Tubulovillous adenoma of colon    ASSESSMENT  Recent Results: The most recent result is correlated with 27.5 mg per week:  Lab Results  Component Value Date   INR 3.6 (A) 11/22/2020   INR 2.9 11/08/2020   INR 3.1 (A) 10/30/2020    Anticoagulation Dosing: Description   INR above goal. HOLD today. HOLD 5/7-5/12, restart on 2.5 mg Mon, Wed, Fri and 5 mg all other days 1-2 days afterwards if okay by Psychologist, sport and exercise. Recheck INR in 2 weeks.       INR today: Supratherapeutic. Possibly in the setting of recent medrol start. Known enhanced anticoagulation effect of Vit K agonist in setting of medrol. Denies any other relevant changes in her diet, medications, or lifestyle. Denies any complains of active bleeding and bruising. Pt has an upcoming EGD procedure. Reviewed HOLD and restart directions with pt. Pt verbalized understanding. Will recheck 1 week after procedure.   PLAN Weekly dose was unchanged by 0% to 27.5 mg/week. HOLD today. HOLD warfarin from 5/7-5/12. Restart on previous weekly dose of 2.5 mg every Mon, Wed, Fri and 5 mg all other days. Increase Vit K intake 2-3 servings/week. Recheck INR in 2 weeks.     Patient Instructions  INR above goal. HOLD today. HOLD 5/7-5/12, restart on 2.5 mg Mon, Wed, Fri and 5 mg all other days 1-2 days afterwards if okay by Psychologist, sport and exercise. Recheck INR in 2 weeks.   Patient advised to contact clinic or seek medical attention if signs/symptoms of bleeding or  thromboembolism occur.  Patient verbalized understanding by repeating back information and was advised to contact me if further medication-related questions arise.   Follow-up Return in about 2 weeks (around 12/06/2020).  [name], PharmD  15 minutes spent face-to-face with the patient during the encounter. 50% of time spent on education, including signs/sx bleeding and clotting, as well as food and drug interactions with warfarin. 50% of time was spent on fingerprick POC INR sample collection,processing, results determination, and documentation

## 2020-11-22 NOTE — Patient Instructions (Signed)
INR above goal. HOLD today. HOLD 5/7-5/12, restart on 2.5 mg Mon, Wed, Fri and 5 mg all other days 1-2 days afterwards if okay by Psychologist, sport and exercise. Recheck INR in 2 weeks.

## 2020-11-23 DIAGNOSIS — N186 End stage renal disease: Secondary | ICD-10-CM | POA: Diagnosis not present

## 2020-11-23 DIAGNOSIS — N2581 Secondary hyperparathyroidism of renal origin: Secondary | ICD-10-CM | POA: Diagnosis not present

## 2020-11-23 DIAGNOSIS — Z992 Dependence on renal dialysis: Secondary | ICD-10-CM | POA: Diagnosis not present

## 2020-11-23 DIAGNOSIS — D631 Anemia in chronic kidney disease: Secondary | ICD-10-CM | POA: Diagnosis not present

## 2020-11-24 ENCOUNTER — Emergency Department (HOSPITAL_COMMUNITY)
Admission: EM | Admit: 2020-11-24 | Discharge: 2020-11-24 | Disposition: A | Discharge status: Home / Self Care | Payer: Medicare Other | Attending: Emergency Medicine | Admitting: Emergency Medicine

## 2020-11-24 ENCOUNTER — Other Ambulatory Visit: Payer: Self-pay

## 2020-11-24 ENCOUNTER — Emergency Department (HOSPITAL_COMMUNITY): Payer: Medicare Other

## 2020-11-24 DIAGNOSIS — R0602 Shortness of breath: Secondary | ICD-10-CM | POA: Diagnosis not present

## 2020-11-24 DIAGNOSIS — Z79899 Other long term (current) drug therapy: Secondary | ICD-10-CM | POA: Diagnosis not present

## 2020-11-24 DIAGNOSIS — Z992 Dependence on renal dialysis: Secondary | ICD-10-CM | POA: Diagnosis not present

## 2020-11-24 DIAGNOSIS — R0600 Dyspnea, unspecified: Secondary | ICD-10-CM | POA: Diagnosis not present

## 2020-11-24 DIAGNOSIS — Z20822 Contact with and (suspected) exposure to covid-19: Secondary | ICD-10-CM | POA: Diagnosis not present

## 2020-11-24 DIAGNOSIS — Z96652 Presence of left artificial knee joint: Secondary | ICD-10-CM | POA: Insufficient documentation

## 2020-11-24 DIAGNOSIS — I120 Hypertensive chronic kidney disease with stage 5 chronic kidney disease or end stage renal disease: Secondary | ICD-10-CM | POA: Diagnosis not present

## 2020-11-24 DIAGNOSIS — N186 End stage renal disease: Secondary | ICD-10-CM | POA: Diagnosis not present

## 2020-11-24 DIAGNOSIS — Z7901 Long term (current) use of anticoagulants: Secondary | ICD-10-CM | POA: Insufficient documentation

## 2020-11-24 DIAGNOSIS — J45909 Unspecified asthma, uncomplicated: Secondary | ICD-10-CM | POA: Diagnosis not present

## 2020-11-24 DIAGNOSIS — R0789 Other chest pain: Secondary | ICD-10-CM | POA: Diagnosis not present

## 2020-11-24 DIAGNOSIS — Z91040 Latex allergy status: Secondary | ICD-10-CM | POA: Diagnosis not present

## 2020-11-24 DIAGNOSIS — I4891 Unspecified atrial fibrillation: Secondary | ICD-10-CM | POA: Diagnosis not present

## 2020-11-24 DIAGNOSIS — D631 Anemia in chronic kidney disease: Secondary | ICD-10-CM | POA: Diagnosis not present

## 2020-11-24 DIAGNOSIS — I517 Cardiomegaly: Secondary | ICD-10-CM | POA: Diagnosis not present

## 2020-11-24 DIAGNOSIS — R0902 Hypoxemia: Secondary | ICD-10-CM | POA: Diagnosis not present

## 2020-11-24 DIAGNOSIS — R231 Pallor: Secondary | ICD-10-CM | POA: Diagnosis not present

## 2020-11-24 DIAGNOSIS — R079 Chest pain, unspecified: Secondary | ICD-10-CM | POA: Diagnosis not present

## 2020-11-24 LAB — MAGNESIUM: Magnesium: 2 mg/dL (ref 1.7–2.4)

## 2020-11-24 LAB — CBC
HCT: 38.9 % (ref 36.0–46.0)
Hemoglobin: 11.8 g/dL — ABNORMAL LOW (ref 12.0–15.0)
MCH: 27.3 pg (ref 26.0–34.0)
MCHC: 30.3 g/dL (ref 30.0–36.0)
MCV: 90 fL (ref 80.0–100.0)
Platelets: 273 10*3/uL (ref 150–400)
RBC: 4.32 MIL/uL (ref 3.87–5.11)
RDW: 18.6 % — ABNORMAL HIGH (ref 11.5–15.5)
WBC: 6.9 10*3/uL (ref 4.0–10.5)
nRBC: 0 % (ref 0.0–0.2)

## 2020-11-24 LAB — APTT: aPTT: 36 seconds (ref 24–36)

## 2020-11-24 LAB — PROTIME-INR
INR: 2.8 — ABNORMAL HIGH (ref 0.8–1.2)
Prothrombin Time: 29.6 seconds — ABNORMAL HIGH (ref 11.4–15.2)

## 2020-11-24 LAB — TROPONIN I (HIGH SENSITIVITY)
Troponin I (High Sensitivity): 14 ng/L (ref <18)
Troponin I (High Sensitivity): 17 ng/L (ref <18)

## 2020-11-24 LAB — RESP PANEL BY RT-PCR (FLU A&B, COVID) ARPGX2
Influenza A by PCR: NEGATIVE
Influenza B by PCR: NEGATIVE
SARS Coronavirus 2 by RT PCR: NEGATIVE

## 2020-11-24 LAB — COMPREHENSIVE METABOLIC PANEL
ALT: 15 U/L (ref 0–44)
AST: 17 U/L (ref 15–41)
Albumin: 3.3 g/dL — ABNORMAL LOW (ref 3.5–5.0)
Alkaline Phosphatase: 100 U/L (ref 38–126)
Anion gap: 10 (ref 5–15)
BUN: 45 mg/dL — ABNORMAL HIGH (ref 8–23)
CO2: 29 mmol/L (ref 22–32)
Calcium: 9.4 mg/dL (ref 8.9–10.3)
Chloride: 100 mmol/L (ref 98–111)
Creatinine, Ser: 6.95 mg/dL — ABNORMAL HIGH (ref 0.44–1.00)
GFR, Estimated: 6 mL/min — ABNORMAL LOW (ref >60)
Glucose, Bld: 110 mg/dL — ABNORMAL HIGH (ref 70–99)
Potassium: 3.6 mmol/L (ref 3.5–5.1)
Sodium: 139 mmol/L (ref 135–145)
Total Bilirubin: 0.6 mg/dL (ref 0.3–1.2)
Total Protein: 7.3 g/dL (ref 6.5–8.1)

## 2020-11-24 MED ORDER — HYDRALAZINE HCL 25 MG PO TABS
25.0000 mg | ORAL_TABLET | Freq: Once | ORAL | Status: AC
  Administered 2020-11-24: 25 mg via ORAL
  Filled 2020-11-24: qty 1

## 2020-11-24 MED ORDER — CARVEDILOL 3.125 MG PO TABS: 25.0000 mg | ORAL_TABLET | Freq: Two times a day (BID) | ORAL | Status: DC

## 2020-11-24 MED ORDER — CARVEDILOL 3.125 MG PO TABS
25.0000 mg | ORAL_TABLET | Freq: Once | ORAL | Status: AC
  Administered 2020-11-24: 25 mg via ORAL
  Filled 2020-11-24: qty 8

## 2020-11-24 MED ORDER — METOPROLOL TARTRATE 5 MG/5ML IV SOLN
5.0000 mg | Freq: Once | INTRAVENOUS | Status: AC
  Administered 2020-11-24: 5 mg via INTRAVENOUS
  Filled 2020-11-24: qty 5

## 2020-11-24 NOTE — ED Triage Notes (Signed)
Pt brought to ED via EMS from home with c/o central CP that began around 5:30AM; radiation to back and neck. Pt reports she has been clammy and short of breath for a few days. Dialysis pt-- M-W-F. Hx afib, frequent falls. 1 SL nitro, 324 asp given PTA by EMS, chest pain rated 0 on arrival to ED. Pt currently alert and oriented, tachycardic, hypertensive.   EMS v/s: 185/112 140HR 97% room air 122 cbg

## 2020-11-24 NOTE — Discharge Instructions (Addendum)
Continue taking your home medications as previously prescribed. You can continue taking the prednisone as long as you take your other home medications to control your A. fib as well Follow-up with your primary care provider and cardiologist. Return to the ER if you start to experience worsening palpitations, chest pain, shortness of breath or leg swelling

## 2020-11-24 NOTE — ED Provider Notes (Signed)
[facility] EMERGENCY DEPARTMENT Provider Note   CSN: [id number] Arrival date & time: 11/24/20  4098     History Chief Complaint  Patient presents with  . Chest Pain    Jane [name] is a 75 y.o. female with a past medical history of A. fib on Coumadin, ESRD on dialysis MWF without any recent missed sessions, hypertension, anemia, GERD presenting to the ED with a chief complaint of shortness of breath.  States that she has been feeling clammy and short of breath for the past few days but this worsened along with presence of chest pain about 4 hours ago.  States that the pain will radiate to her back and describes it as a pressure sensation.  She was given 1 sublingual nitroglycerin and a full dose of aspirin by EMS which improved her chest pain significantly.  She has not taken any of her home medications but has been otherwise compliant with them.  She denies any recent alcohol use or vomiting, diarrhea, fever, cough, injuries or falls, vision changes. Patient has been on prednisone for the past 4 days for tendonitis in her foot. She is unsure if this could be causing her heart rate to be high. Patient sees Dr. Einar Gip as her cardiologist.  HPI     Past Medical History:  Diagnosis Date  . Acid reflux   . Anemia of chronic disease   . Arthritis   . Asthma   . Atrial fibrillation ([place])   . Bilateral carotid bruits   . Complication of anesthesia    "hard to wake up, I have sleep apnea" no CPAP  . Depression   . Diverticulitis   . Duodenal ulcer   . Dysrhythmia    Afib  . ESRD (end stage renal disease) ([place])    MWF [place]  . Headache   . History of blood transfusion   . Hypertension   . Malaise and fatigue   . Orthostatic hypotension   . Shortness of breath    " when I walk to fast"  . Sleep apnea   . Syncope   . Tubulovillous adenoma of colon     Patient Active Problem List   Diagnosis Date Noted  . Gastric intestinal metaplasia 10/18/2020  .  Dyspepsia 10/18/2020  . Chronic constipation 10/18/2020  . Nausea without vomiting 05/29/2020  . Generalized osteoarthritis of multiple sites 03/27/2020  . Chronic pain disorder 03/27/2020  . Acute GI bleeding 01/13/2020  . Duodenal ulcer with hemorrhage   . Lower GI bleed 01/12/2020  . Monitoring for long-term anticoagulant use 01/03/2020  . Intestinal metaplasia of gastric mucosa 12/04/2019  . Gastric polyp 12/04/2019  . Tubulovillous adenoma of small intestine 12/03/2019  . Adenomatous duodenal polyp 12/03/2019  . Abnormal endoscopy of upper gastrointestinal tract 12/03/2019  . Depression, major, recurrent, moderate ([place]) 09/22/2018  . Orthostatic hypotension 09/16/2018  . Bilateral lower extremity edema 08/24/2018  . Abdominal pain, diffuse 05/11/2018  . Body mass index 40.0-44.9, adult ([place]) 03/18/2018  . Unilateral primary osteoarthritis, right knee 03/18/2018  . Chronic pain of right knee 03/18/2018  . Abnormal facial hair 11/10/2017  . Chronic anticoagulation 11/10/2017  . GERD (gastroesophageal reflux disease) 06/16/2017  . Constipation 10/16/2016  . Morbid obesity ([place]) 10/16/2016  . Diverticulitis of large intestine with perforation 06/26/2016  . Anemia in other chronic diseases classified elsewhere 06/25/2016  . Chest pain with moderate risk for cardiac etiology 06/25/2016  . ESRD on hemodialysis ([place]) 06/30/2012  . Paroxysmal atrial  fibrillation ([place]) 06/30/2012  . Hypertension with renal disease 06/30/2012    Past Surgical History:  Procedure Laterality Date  . A/V FISTULAGRAM Right 10/18/2020   Procedure: A/V FISTULAGRAM;  Surgeon: Marty Heck, MD;  Location: [place] [facility];  Service: Cardiovascular;  Laterality: Right;  . AV FISTULA PLACEMENT    . BACK SURGERY     Lumbar fusion L 4 and L 5  . BIOPSY  01/09/2020   Procedure: BIOPSY;  Surgeon: Rush Landmark Telford Nab., MD;  Location: [place];  Service: Gastroenterology;;  . COLONOSCOPY    .  ENDOSCOPIC MUCOSAL RESECTION N/A 01/09/2020   Procedure: ENDOSCOPIC MUCOSAL RESECTION;  Surgeon: Rush Landmark Telford Nab., MD;  Location: [place];  Service: Gastroenterology;  Laterality: N/A;  . ESOPHAGOGASTRODUODENOSCOPY    . ESOPHAGOGASTRODUODENOSCOPY (EGD) WITH PROPOFOL N/A 01/09/2020   Procedure: ESOPHAGOGASTRODUODENOSCOPY (EGD) WITH PROPOFOL;  Surgeon: Rush Landmark Telford Nab., MD;  Location: [place];  Service: Gastroenterology;  Laterality: N/A;  . ESOPHAGOGASTRODUODENOSCOPY (EGD) WITH PROPOFOL N/A 01/13/2020   Procedure: ESOPHAGOGASTRODUODENOSCOPY (EGD) WITH PROPOFOL;  Surgeon: Irene Shipper, MD;  Location: [place];  Service: Gastroenterology;  Laterality: N/A;  . EUS N/A 01/09/2020   Procedure: UPPER ENDOSCOPIC ULTRASOUND (EUS) RADIAL;  Surgeon: Rush Landmark Telford Nab., MD;  Location: [place];  Service: Gastroenterology;  Laterality: N/A;  . HEMOSTASIS CLIP PLACEMENT  01/09/2020   Procedure: HEMOSTASIS CLIP PLACEMENT;  Surgeon: Irving Copas., MD;  Location: [place];  Service: Gastroenterology;;  . HEMOSTASIS CONTROL  01/13/2020   Procedure: HEMOSTASIS CONTROL;  Surgeon: Irene Shipper, MD;  Location: [facility];  Service: Gastroenterology;;  Vassie Loll  . HOT HEMOSTASIS N/A 01/13/2020   Procedure: HOT HEMOSTASIS (ARGON PLASMA COAGULATION/BICAP);  Surgeon: Irene Shipper, MD;  Location: [place];  Service: Gastroenterology;  Laterality: N/A;  . IR GENERIC HISTORICAL  07/09/2016   IR US GUIDE VASC ACCESS RIGHT 07/09/2016 Arne Cleveland, MD [facility]  . IR GENERIC HISTORICAL  07/09/2016   IR FLUORO GUIDE CV LINE RIGHT 07/09/2016 Arne Cleveland, MD [facility]  . KNEE ARTHROPLASTY Left   . LAPAROSCOPIC SIGMOID COLECTOMY N/A 07/11/2016   Procedure: LAPAROSCOPIC SIGMOID COLECTOMY;  Surgeon: Clovis Riley, MD;  Location: [place];  Service: General;  Laterality: N/A;  . MASS EXCISION Right 04/10/2020   Procedure: EXCISION SKIN NODULE RIGHT FOREARM;  Surgeon:  Waynetta Sandy, MD;  Location: [place];  Service: Vascular;  Laterality: Right;  . SCLEROTHERAPY  01/09/2020   Procedure: Clide Deutscher;  Surgeon: Mansouraty, Telford Nab., MD;  Location: [place];  Service: Gastroenterology;;  . Clide Deutscher  01/13/2020   Procedure: Clide Deutscher;  Surgeon: Irene Shipper, MD;  Location: [place];  Service: Gastroenterology;;  . Maryagnes Amos INJECTION  01/09/2020   Procedure: SUBMUCOSAL LIFTING INJECTION;  Surgeon: Irving Copas., MD;  Location: [place];  Service: Gastroenterology;;  . TUBAL LIGATION       OB History   No obstetric history on file.     Family History  Problem Relation Age of Onset  . Heart failure Mother   . Stroke Mother   . Other Father   . Colon cancer Neg Hx   . Liver disease Neg Hx   . Esophageal cancer Neg Hx   . Stomach cancer Neg Hx   . Inflammatory bowel disease Neg Hx   . Rectal cancer Neg Hx   . Pancreatic cancer Neg Hx     Social History   Tobacco Use  . Smoking status: Never Smoker  . Smokeless tobacco:  Never Used  Vaping Use  . Vaping Use: Never used  Substance Use Topics  . Alcohol use: No  . Drug use: No    Home Medications Prior to Admission medications   Medication Sig Start Date End Date Taking? Authorizing Provider  albuterol (PROVENTIL HFA;VENTOLIN HFA) 108 (90 Base) MCG/ACT inhaler Inhale 1 puff into the lungs every 6 (six) hours as needed for wheezing or shortness of breath. Patient taking differently: Inhale 2 puffs into the lungs every 6 (six) hours as needed for wheezing or shortness of breath. 01/26/18  Yes [name], [name], MD  B Complex-C-Zn-Folic Acid (DIALYVITE 563 WITH ZINC) 0.8 MG TABS Take 1 tablet by mouth daily. 07/19/20  Yes [provider]  carvedilol (COREG) 25 MG tablet Take 1 tablet (25 mg total) by mouth 2 (two) times daily with a meal. 10/30/20  Yes [place]well, Celeste C, PA-C  DULoxetine (CYMBALTA) 20 MG capsule Take 2 capsules (40 mg  total) by mouth daily. 10/23/20  Yes [name], [name], MD  ethyl chloride spray Apply 1 application topically every Monday, Wednesday, and Friday with hemodialysis. 12/25/17  Yes [provider]  hydrALAZINE (APRESOLINE) 25 MG tablet Take 1 tablet (25 mg total) by mouth 3 (three) times daily. 09/28/20  Yes Adrian Prows, MD  HYDROcodone-acetaminophen (NORCO/VICODIN) 5-325 MG tablet Take 1 tablet by mouth every 12 (twelve) hours as needed for moderate pain. 10/23/20  Yes [name], [name], MD  isosorbide dinitrate (ISORDIL) 30 MG tablet Take 1 tablet (30 mg total) by mouth 3 (three) times daily. 09/28/20  Yes Adrian Prows, MD  LINZESS 145 MCG CAPS capsule TAKE 1 CAPSULE BY MOUTH ONCE DAILY BEFORE  BREAKFAST Patient taking differently: Take 145 mcg by mouth daily before breakfast. 11/08/20  Yes Pyrtle, Lajuan Lines, MD  Menthol, Topical Analgesic, (BIOFREEZE ROLL-ON) 4 % GEL Apply 1 application topically daily as needed (pain).   Yes [provider]  omeprazole (PRILOSEC) 40 MG capsule TAKE 1 CAPSULE BY MOUTH TWICE DAILY BEFORE A MEAL Patient taking differently: Take 40 mg by mouth 2 (two) times daily. 09/28/20  Yes Mansouraty, Telford Nab., MD  amLODipine (NORVASC) 5 MG tablet Take 5 mg by mouth in the morning and at bedtime. 06/06/20   [provider]  warfarin (COUMADIN) 5 MG tablet USE AS DIRECTED BY COUMADIN CLINIC Patient taking differently: Take 2.5-5 mg by mouth See admin instructions. USE AS DIRECTED BY COUMADIN CLINIC Take 2.5mg  on M,W,F and 5mg  all other days 11/09/20   [name], [name], MD    Allergies    Latex, Penicillins, Sulfa antibiotics, and Tape  Review of Systems   Review of Systems  Constitutional: Positive for diaphoresis. Negative for appetite change, chills and fever.  HENT: Negative for ear pain, rhinorrhea, sneezing and sore throat.   Eyes: Negative for photophobia and visual disturbance.  Respiratory: Positive for shortness of breath. Negative for cough, chest  tightness and wheezing.   Cardiovascular: Positive for chest pain. Negative for palpitations.  Gastrointestinal: Negative for abdominal pain, blood in stool, constipation, diarrhea, nausea and vomiting.  Genitourinary: Negative for dysuria, hematuria and urgency.  Musculoskeletal: Negative for myalgias.  Skin: Negative for rash.  Neurological: Negative for dizziness, weakness and light-headedness.    Physical Exam Updated Vital Signs BP (!) 181/70   Pulse 73   Temp 98 F (36.7 C) (Oral)   Resp 20   Ht 5\' 4"  (1.626 m)   Wt 98.4 kg   SpO2 95%   BMI 37.25 kg/m  Physical Exam Vitals and nursing note reviewed.  Constitutional:      General: She is not in acute distress.    Appearance: She is well-developed.  HENT:     Head: Normocephalic and atraumatic.     Nose: Nose normal.  Eyes:     General: No scleral icterus.       Left eye: No discharge.     Conjunctiva/sclera: Conjunctivae normal.  Cardiovascular:     Rate and Rhythm: Tachycardia present. Rhythm irregular.     Heart sounds: Normal heart sounds. No murmur heard. No friction rub. No gallop.   Pulmonary:     Effort: Pulmonary effort is normal. No respiratory distress.     Breath sounds: Normal breath sounds.  Abdominal:     General: Bowel sounds are normal. There is no distension.     Palpations: Abdomen is soft.     Tenderness: There is no abdominal tenderness. There is no guarding.  Musculoskeletal:        General: Normal range of motion.     Cervical back: Normal range of motion and neck supple.     Right lower leg: No tenderness. No edema.     Left lower leg: No tenderness. No edema.  Skin:    General: Skin is warm and dry.     Findings: No rash.  Neurological:     Mental Status: She is alert.     Motor: No abnormal muscle tone.     Coordination: Coordination normal.     ED Results / Procedures / Treatments   Labs (all labs ordered are listed, but only abnormal results are displayed) Labs Reviewed   CBC - Abnormal; Notable for the following components:      Result Value   Hemoglobin 11.8 (*)    RDW 18.6 (*)    All other components within normal limits  COMPREHENSIVE METABOLIC PANEL - Abnormal; Notable for the following components:   Glucose, Bld 110 (*)    BUN 45 (*)    Creatinine, Ser 6.95 (*)    Albumin 3.3 (*)    GFR, Estimated 6 (*)    All other components within normal limits  PROTIME-INR - Abnormal; Notable for the following components:   Prothrombin Time 29.6 (*)    INR 2.8 (*)    All other components within normal limits  RESP PANEL BY RT-PCR (FLU A&B, COVID) ARPGX2  MAGNESIUM  APTT  CBG MONITORING, ED  TROPONIN I (HIGH SENSITIVITY)  TROPONIN I (HIGH SENSITIVITY)    EKG EKG Interpretation  Date/Time:  Saturday Nov 24 2020 11:57:17 EDT Ventricular Rate:  61 PR Interval:  187 QRS Duration: 89 QT Interval:  412 QTC Calculation: 415 R Axis:   -29 Text Interpretation: Sinus rhythm Borderline left axis deviation Anteroseptal infarct, age indeterminate Atrial fibrillation RESOLVED SINCE PREVIOUS Confirmed by Blanchie Dessert [phone]) on 11/24/2020 1:19:38 PM   Radiology DG Chest Portable 1 View  Result Date: 11/24/2020 CLINICAL DATA:  Dyspnea and chest pain EXAM: PORTABLE CHEST 1 VIEW COMPARISON:  11/21/2019 chest radiograph. FINDINGS: Stable cardiomediastinal silhouette with mild cardiomegaly. No pneumothorax. No pleural effusion. No pulmonary edema. Minimal curvilinear opacity at the peripheral left lung base. Vascular stent overlies the right axilla. IMPRESSION: 1. Stable mild cardiomegaly without pulmonary edema. 2. Minimal curvilinear opacity at the peripheral left lung base, favor scarring or atelectasis. Electronically Signed   By: Ilona Sorrel M.D.   On: 11/24/2020 10:22    Procedures Procedures   Medications Ordered in ED Medications  metoprolol tartrate (LOPRESSOR) injection 5 mg (5 mg Intravenous Given 11/24/20 0956)  hydrALAZINE (APRESOLINE) tablet 25  mg (25 mg Oral Given 11/24/20 0956)  carvedilol (COREG) tablet 25 mg (25 mg Oral Given 11/24/20 1058)    ED Course  I have reviewed the triage vital signs and the nursing notes.  Pertinent labs & imaging results that were available during my care of the patient were reviewed by me and considered in my medical decision making (see chart for details).  Clinical Course as of 11/24/20 1401  Sat Nov 24, 2020  1004 INR(!): 2.8 [HK]  1013 States that her INR was elevated to 3.2 days ago and was told to adjust her medications. [HK]  1023 Magnesium: 2.0 [HK]  1023 Troponin I (High Sensitivity): 14 [HK]  1023 Creatinine(!): 6.95 [HK]  1023 Potassium: 3.6 [HK]  1034 DG Chest Portable 1 View No acute findings. [HK]  1058 SARS Coronavirus 2 by RT PCR: NEGATIVE [HK]  1140 Heart rate improved to 60-70 [HK]  1140 Pulse Rate: 64 [HK]  1242 BP(!): 168/61 [HK]  1348 Troponin I (High Sensitivity): 17 [HK]  1348 Pulse Rate: 73 [HK]    Clinical Course User Index [HK] Delia Heady, PA-C   MDM Rules/Calculators/A&P                          75 year old female with past medical history of A. fib on Coumadin, ESRD on dialysis MWF, hypertension presenting to the ED with a chief complaint of chest pain.  Symptoms began early this morning a couple of hours ago.  Has been feeling clammy and short of breath for the past few days.  Has not missed any dialysis sessions and has been compliant with her medications up until this morning.  She reports significant improvement in her chest pain with full dose of aspirin and 1 nitroglycerin given by EMS.  She states that she had similar episodes back in the 1990s when she was first diagnosed with A. fib.  Denies any recent alcohol use.  No vomiting, diarrhea.  On exam patient tachycardic with rates in the 130s.  EKG shows A. fib.  She is not hypoxic.  She is hypertensive.  Will obtain lab work, chest x-ray and treat with home medications as well as IV Lopressor in the  meantime.  Lab work shows elevated creatinine which is consistent with her known ESRD.  Normal potassium and magnesium level noted here.  INR is therapeutic at 2.8 and improved from her reading earlier in the week at 3.6.  Chest x-ray without any acute findings.  COVID and flu test are negative.  Patient given her home medications here as well as IV Lopressor.  She remains hypertensive but her A. fib with RVR has significantly improved with a heart rate of 60s to 70s.  Initial delta troponin are both negative.  Repeat EKG shows sinus rhythm.  She is on prednisone so I suspect that this could be because of her going into A. fib.  Advised her to remain compliant with her regular prescribed home medications, and she can finish her prednisone as she has today and tomorrow left as long as she continues her other home medications.  Patient is agreeable to the plan reports improvement in her symptoms.  I suspect that her symptoms were due to her being in A. fib with RVR.  No evidence of ACS based on today's work-up.  Return precautions given.  Patient is hemodynamically stable,  in NAD, and able to ambulate in the ED. Evaluation does not show pathology that would require ongoing emergent intervention or inpatient treatment. I explained the diagnosis to the patient. Pain has been managed and has no complaints prior to discharge. Patient is comfortable with above plan and is stable for discharge at this time. All questions were answered prior to disposition. Strict return precautions for returning to the ED were discussed. Encouraged follow up with PCP.   An After Visit Summary was printed and given to the patient.   Portions of this note were generated with Lobbyist. Dictation errors may occur despite best attempts at proofreading.  Final Clinical Impression(s) / ED Diagnoses Final diagnoses:  Atrial fibrillation with rapid ventricular response [facility])    Rx / DC Orders ED Discharge Orders     None       Delia Heady, PA-C 11/24/20 1401    Blanchie Dessert, MD 11/25/20 [phone]

## 2020-11-26 ENCOUNTER — Emergency Department (HOSPITAL_COMMUNITY)
Admission: EM | Admit: 2020-11-26 | Discharge: 2020-11-26 | Disposition: A | Discharge status: Home / Self Care | Payer: Medicare Other | Attending: Emergency Medicine | Admitting: Emergency Medicine

## 2020-11-26 ENCOUNTER — Other Ambulatory Visit: Payer: Self-pay

## 2020-11-26 ENCOUNTER — Telehealth: Payer: Self-pay | Admitting: Gastroenterology

## 2020-11-26 ENCOUNTER — Encounter (HOSPITAL_COMMUNITY): Payer: Self-pay

## 2020-11-26 DIAGNOSIS — R079 Chest pain, unspecified: Secondary | ICD-10-CM | POA: Diagnosis not present

## 2020-11-26 DIAGNOSIS — Z96652 Presence of left artificial knee joint: Secondary | ICD-10-CM | POA: Insufficient documentation

## 2020-11-26 DIAGNOSIS — Z992 Dependence on renal dialysis: Secondary | ICD-10-CM | POA: Diagnosis not present

## 2020-11-26 DIAGNOSIS — I120 Hypertensive chronic kidney disease with stage 5 chronic kidney disease or end stage renal disease: Secondary | ICD-10-CM | POA: Insufficient documentation

## 2020-11-26 DIAGNOSIS — G501 Atypical facial pain: Secondary | ICD-10-CM | POA: Diagnosis not present

## 2020-11-26 DIAGNOSIS — N186 End stage renal disease: Secondary | ICD-10-CM | POA: Insufficient documentation

## 2020-11-26 DIAGNOSIS — Z79899 Other long term (current) drug therapy: Secondary | ICD-10-CM | POA: Insufficient documentation

## 2020-11-26 DIAGNOSIS — Z91040 Latex allergy status: Secondary | ICD-10-CM | POA: Diagnosis not present

## 2020-11-26 DIAGNOSIS — R000 Tachycardia, unspecified: Secondary | ICD-10-CM | POA: Diagnosis not present

## 2020-11-26 DIAGNOSIS — N2581 Secondary hyperparathyroidism of renal origin: Secondary | ICD-10-CM | POA: Diagnosis not present

## 2020-11-26 DIAGNOSIS — I480 Paroxysmal atrial fibrillation: Secondary | ICD-10-CM | POA: Diagnosis not present

## 2020-11-26 DIAGNOSIS — R0789 Other chest pain: Secondary | ICD-10-CM | POA: Diagnosis not present

## 2020-11-26 DIAGNOSIS — Z7901 Long term (current) use of anticoagulants: Secondary | ICD-10-CM | POA: Diagnosis not present

## 2020-11-26 DIAGNOSIS — D631 Anemia in chronic kidney disease: Secondary | ICD-10-CM | POA: Insufficient documentation

## 2020-11-26 DIAGNOSIS — R0602 Shortness of breath: Secondary | ICD-10-CM | POA: Diagnosis not present

## 2020-11-26 DIAGNOSIS — J45909 Unspecified asthma, uncomplicated: Secondary | ICD-10-CM | POA: Diagnosis not present

## 2020-11-26 DIAGNOSIS — I10 Essential (primary) hypertension: Secondary | ICD-10-CM | POA: Diagnosis not present

## 2020-11-26 LAB — MAGNESIUM: Magnesium: 1.7 mg/dL (ref 1.7–2.4)

## 2020-11-26 LAB — CBC WITH DIFFERENTIAL/PLATELET
Abs Immature Granulocytes: 0.03 10*3/uL (ref 0.00–0.07)
Basophils Absolute: 0 10*3/uL (ref 0.0–0.1)
Basophils Relative: 0 %
Eosinophils Absolute: 0.9 10*3/uL — ABNORMAL HIGH (ref 0.0–0.5)
Eosinophils Relative: 9 %
HCT: 34.2 % — ABNORMAL LOW (ref 36.0–46.0)
Hemoglobin: 10.4 g/dL — ABNORMAL LOW (ref 12.0–15.0)
Immature Granulocytes: 0 %
Lymphocytes Relative: 10 %
Lymphs Abs: 0.9 10*3/uL (ref 0.7–4.0)
MCH: 27.2 pg (ref 26.0–34.0)
MCHC: 30.4 g/dL (ref 30.0–36.0)
MCV: 89.3 fL (ref 80.0–100.0)
Monocytes Absolute: 1 10*3/uL (ref 0.1–1.0)
Monocytes Relative: 11 %
Neutro Abs: 6.6 10*3/uL (ref 1.7–7.7)
Neutrophils Relative %: 70 %
Platelets: 234 10*3/uL (ref 150–400)
RBC: 3.83 MIL/uL — ABNORMAL LOW (ref 3.87–5.11)
RDW: 18.8 % — ABNORMAL HIGH (ref 11.5–15.5)
WBC: 9.3 10*3/uL (ref 4.0–10.5)
nRBC: 0 % (ref 0.0–0.2)

## 2020-11-26 LAB — BASIC METABOLIC PANEL
Anion gap: 7 (ref 5–15)
BUN: 40 mg/dL — ABNORMAL HIGH (ref 8–23)
CO2: 28 mmol/L (ref 22–32)
Calcium: 8.2 mg/dL — ABNORMAL LOW (ref 8.9–10.3)
Chloride: 103 mmol/L (ref 98–111)
Creatinine, Ser: 6.02 mg/dL — ABNORMAL HIGH (ref 0.44–1.00)
GFR, Estimated: 7 mL/min — ABNORMAL LOW (ref >60)
Glucose, Bld: 106 mg/dL — ABNORMAL HIGH (ref 70–99)
Potassium: 2.9 mmol/L — ABNORMAL LOW (ref 3.5–5.1)
Sodium: 138 mmol/L (ref 135–145)

## 2020-11-26 LAB — TROPONIN I (HIGH SENSITIVITY)
Troponin I (High Sensitivity): 15 ng/L (ref <18)
Troponin I (High Sensitivity): 31 ng/L — ABNORMAL HIGH (ref <18)

## 2020-11-26 MED ORDER — CARVEDILOL 3.125 MG PO TABS
25.0000 mg | ORAL_TABLET | Freq: Once | ORAL | Status: AC
  Administered 2020-11-26: 25 mg via ORAL
  Filled 2020-11-26: qty 8

## 2020-11-26 MED ORDER — AMIODARONE HCL 200 MG PO TABS: 200.0000 mg | ORAL_TABLET | Freq: Two times a day (BID) | ORAL | 0 refills | Status: DC

## 2020-11-26 MED ORDER — DILTIAZEM HCL 30 MG PO TABS
30.0000 mg | ORAL_TABLET | Freq: Once | ORAL | Status: AC
  Administered 2020-11-26: 30 mg via ORAL
  Filled 2020-11-26: qty 1

## 2020-11-26 MED ORDER — DILTIAZEM HCL 25 MG/5ML IV SOLN
20.0000 mg | Freq: Once | INTRAVENOUS | Status: AC
  Administered 2020-11-26: 20 mg via INTRAVENOUS
  Filled 2020-11-26: qty 5

## 2020-11-26 MED ORDER — AMIODARONE HCL 200 MG PO TABS
200.0000 mg | ORAL_TABLET | Freq: Once | ORAL | Status: AC
  Administered 2020-11-26: 200 mg via ORAL
  Filled 2020-11-26: qty 1

## 2020-11-26 NOTE — ED Notes (Signed)
hgb redrawn and sent to lab

## 2020-11-26 NOTE — ED Provider Notes (Signed)
75 year old female here with chest pain and A. fib with RVR.  Troponin mildly increased, the patient is ESRD patient. Received IV and p.o. Dilts with significant improvement in low rate. Plan is to follow-up with cardiology recommendations, they have already been called. Physical Exam  BP (!) 189/69   Pulse 73   Temp 98.2 F (36.8 C) (Oral)   Resp (!) 23   Ht 5\' 4"  (1.626 m)   Wt 98.4 kg   SpO2 95%   BMI 37.25 kg/m     MDM  Upon reevaluation, patient is resting comfortably.  No acute distress.  I discussed patient with Dr. Einar Gip, her personal cardiologist.  He believes she is stable for discharge as her heart rate is improved.  He does not see a need to trend troponins as she is ESRD, does not have continued chest pain, and troponin is minimally elevated.  Likely was demand.  He recommends starting her on amiodarone 200 mg twice daily.  He will help follow-up closely with his office.  I discussed these recommendations with patient.  She feels comfortable going home.  I recommended she dialyze on her schedule.  We discussed return precautions and symptomatic management recommendations.  Patient discharged in stable condition.       Suzan Nailer, DO 11/26/20 2230    Noemi Chapel, MD 11/27/20 1536

## 2020-11-26 NOTE — ED Notes (Signed)
Lab at bedside. Dr Burt Ek notified HR 138-140 and patient c/o tightness in her neck.

## 2020-11-26 NOTE — ED Notes (Signed)
Patient placed on 2 liters 02 for c/o SOB. Lab unsuccessful at troponin blood draw x 2 attempts. MD notified.

## 2020-11-26 NOTE — Discharge Instructions (Addendum)
Start taking amiodarone twice daily.  Dr. Irven Shelling clinic called due to help schedule an appointment very closely.  Would also like you to follow-up closely with your kidney doctors to check your potassium.

## 2020-11-26 NOTE — ED Notes (Signed)
Unsuccessful attempts  2 for troponin draw. Lab to attempt.

## 2020-11-26 NOTE — Telephone Encounter (Signed)
Pt is in hospital with a fib but should go home this pm. She has procedure scheduled with Dr. Jannifer Rodney and wants to know if this needs to be rescheduled. Please advise.

## 2020-11-26 NOTE — ED Notes (Signed)
ED Provider at bedside. 

## 2020-11-26 NOTE — ED Notes (Signed)
Resting in bed, watching TV.

## 2020-11-26 NOTE — ED Provider Notes (Signed)
[facility] EMERGENCY DEPARTMENT Provider Note   CSN: [id number] Arrival date & time: 11/26/20  0821     History Chief Complaint  Patient presents with  . Chest Pain    Jane [name] is a 75 y.o. female.  HPI Presents with chest pain.  Occurred around 3 AM feels like a tightness in her face, the front of her chest, her back.  Lasted for a few hours, she took her medication and it seemed to help it go away.  She went to dialysis this morning and felt ok. She developed some chest and face tightness about 30 minutes into her HD and felt fast heart rate, similar to when she came in with afib with RVR a couple of days ago. Her blood pressure was high. She was discontinued on dialysis and EMS was called. They gave her nitro and ASA; her BP improved, she felt her HR go down, and felt better. She is asymptomatic now. Was nauseous on arrival, now this is gone. No lightheadedness/syncope. Taking her coreg as prescribed.     Past Medical History:  Diagnosis Date  . Acid reflux   . Anemia of chronic disease   . Arthritis   . Asthma   . Atrial fibrillation ([place])   . Bilateral carotid bruits   . Complication of anesthesia    "hard to wake up, I have sleep apnea" no CPAP  . Depression   . Diverticulitis   . Duodenal ulcer   . Dysrhythmia    Afib  . ESRD (end stage renal disease) ([place])    MWF [facility]  . Headache   . History of blood transfusion   . Hypertension   . Malaise and fatigue   . Orthostatic hypotension   . Shortness of breath    " when I walk to fast"  . Sleep apnea   . Syncope   . Tubulovillous adenoma of colon     Patient Active Problem List   Diagnosis Date Noted  . Gastric intestinal metaplasia 10/18/2020  . Dyspepsia 10/18/2020  . Chronic constipation 10/18/2020  . Nausea without vomiting 05/29/2020  . Generalized osteoarthritis of multiple sites 03/27/2020  . Chronic pain disorder 03/27/2020  . Acute GI bleeding 01/13/2020  . Duodenal  ulcer with hemorrhage   . Lower GI bleed 01/12/2020  . Monitoring for long-term anticoagulant use 01/03/2020  . Intestinal metaplasia of gastric mucosa 12/04/2019  . Gastric polyp 12/04/2019  . Tubulovillous adenoma of small intestine 12/03/2019  . Adenomatous duodenal polyp 12/03/2019  . Abnormal endoscopy of upper gastrointestinal tract 12/03/2019  . Depression, major, recurrent, moderate ([place]) 09/22/2018  . Orthostatic hypotension 09/16/2018  . Bilateral lower extremity edema 08/24/2018  . Abdominal pain, diffuse 05/11/2018  . Body mass index 40.0-44.9, adult ([place]) 03/18/2018  . Unilateral primary osteoarthritis, right knee 03/18/2018  . Chronic pain of right knee 03/18/2018  . Abnormal facial hair 11/10/2017  . Chronic anticoagulation 11/10/2017  . GERD (gastroesophageal reflux disease) 06/16/2017  . Constipation 10/16/2016  . Morbid obesity ([place]) 10/16/2016  . Diverticulitis of large intestine with perforation 06/26/2016  . Anemia in other chronic diseases classified elsewhere 06/25/2016  . Chest pain with moderate risk for cardiac etiology 06/25/2016  . ESRD on hemodialysis ([place]) 06/30/2012  . Paroxysmal atrial fibrillation ([place]) 06/30/2012  . Hypertension with renal disease 06/30/2012    Past Surgical History:  Procedure Laterality Date  . A/V FISTULAGRAM Right 10/18/2020   Procedure: A/V FISTULAGRAM;  Surgeon: Marty Heck, MD;  Location: [place] [facility];  Service: Cardiovascular;  Laterality: Right;  . AV FISTULA PLACEMENT    . BACK SURGERY     Lumbar fusion L 4 and L 5  . BIOPSY  01/09/2020   Procedure: BIOPSY;  Surgeon: Rush Landmark Telford Nab., MD;  Location: [place];  Service: Gastroenterology;;  . COLONOSCOPY    . ENDOSCOPIC MUCOSAL RESECTION N/A 01/09/2020   Procedure: ENDOSCOPIC MUCOSAL RESECTION;  Surgeon: Rush Landmark Telford Nab., MD;  Location: [place];  Service: Gastroenterology;  Laterality: N/A;  . ESOPHAGOGASTRODUODENOSCOPY    .  ESOPHAGOGASTRODUODENOSCOPY (EGD) WITH PROPOFOL N/A 01/09/2020   Procedure: ESOPHAGOGASTRODUODENOSCOPY (EGD) WITH PROPOFOL;  Surgeon: Rush Landmark Telford Nab., MD;  Location: [place];  Service: Gastroenterology;  Laterality: N/A;  . ESOPHAGOGASTRODUODENOSCOPY (EGD) WITH PROPOFOL N/A 01/13/2020   Procedure: ESOPHAGOGASTRODUODENOSCOPY (EGD) WITH PROPOFOL;  Surgeon: Irene Shipper, MD;  Location: [place];  Service: Gastroenterology;  Laterality: N/A;  . EUS N/A 01/09/2020   Procedure: UPPER ENDOSCOPIC ULTRASOUND (EUS) RADIAL;  Surgeon: Rush Landmark Telford Nab., MD;  Location: [place];  Service: Gastroenterology;  Laterality: N/A;  . HEMOSTASIS CLIP PLACEMENT  01/09/2020   Procedure: HEMOSTASIS CLIP PLACEMENT;  Surgeon: Irving Copas., MD;  Location: [place];  Service: Gastroenterology;;  . HEMOSTASIS CONTROL  01/13/2020   Procedure: HEMOSTASIS CONTROL;  Surgeon: Irene Shipper, MD;  Location: [facility];  Service: Gastroenterology;;  Vassie Loll  . HOT HEMOSTASIS N/A 01/13/2020   Procedure: HOT HEMOSTASIS (ARGON PLASMA COAGULATION/BICAP);  Surgeon: Irene Shipper, MD;  Location: [place];  Service: Gastroenterology;  Laterality: N/A;  . IR GENERIC HISTORICAL  07/09/2016   IR US GUIDE VASC ACCESS RIGHT 07/09/2016 Arne Cleveland, MD [facility]  . IR GENERIC HISTORICAL  07/09/2016   IR FLUORO GUIDE CV LINE RIGHT 07/09/2016 Arne Cleveland, MD [facility]  . KNEE ARTHROPLASTY Left   . LAPAROSCOPIC SIGMOID COLECTOMY N/A 07/11/2016   Procedure: LAPAROSCOPIC SIGMOID COLECTOMY;  Surgeon: Clovis Riley, MD;  Location: [place];  Service: General;  Laterality: N/A;  . MASS EXCISION Right 04/10/2020   Procedure: EXCISION SKIN NODULE RIGHT FOREARM;  Surgeon: Waynetta Sandy, MD;  Location: [place];  Service: Vascular;  Laterality: Right;  . SCLEROTHERAPY  01/09/2020   Procedure: Clide Deutscher;  Surgeon: Mansouraty, Telford Nab., MD;  Location: [place];  Service:  Gastroenterology;;  . Clide Deutscher  01/13/2020   Procedure: Clide Deutscher;  Surgeon: Irene Shipper, MD;  Location: [place];  Service: Gastroenterology;;  . Maryagnes Amos INJECTION  01/09/2020   Procedure: SUBMUCOSAL LIFTING INJECTION;  Surgeon: Irving Copas., MD;  Location: [place];  Service: Gastroenterology;;  . TUBAL LIGATION       OB History   No obstetric history on file.     Family History  Problem Relation Age of Onset  . Heart failure Mother   . Stroke Mother   . Other Father   . Colon cancer Neg Hx   . Liver disease Neg Hx   . Esophageal cancer Neg Hx   . Stomach cancer Neg Hx   . Inflammatory bowel disease Neg Hx   . Rectal cancer Neg Hx   . Pancreatic cancer Neg Hx     Social History   Tobacco Use  . Smoking status: Never Smoker  . Smokeless tobacco: Never Used  Vaping Use  . Vaping Use: Never used  Substance Use Topics  . Alcohol use: No  . Drug use: No    Home Medications Prior to Admission medications   Medication Sig  Start Date End Date Taking? Authorizing Provider  albuterol (PROVENTIL HFA;VENTOLIN HFA) 108 (90 Base) MCG/ACT inhaler Inhale 1 puff into the lungs every 6 (six) hours as needed for wheezing or shortness of breath. Patient taking differently: Inhale 2 puffs into the lungs every 6 (six) hours as needed for wheezing or shortness of breath. 01/26/18   [name], [name], MD  amLODipine (NORVASC) 5 MG tablet Take 5 mg by mouth in the morning and at bedtime. 06/06/20   [provider]  B Complex-C-Zn-Folic Acid (DIALYVITE 496 WITH ZINC) 0.8 MG TABS Take 1 tablet by mouth daily. 07/19/20   [provider]  carvedilol (COREG) 25 MG tablet Take 1 tablet (25 mg total) by mouth 2 (two) times daily with a meal. 10/30/20   [place], Celeste C, PA-C  DULoxetine (CYMBALTA) 20 MG capsule Take 2 capsules (40 mg total) by mouth daily. 10/23/20   [name], [name], MD  ethyl chloride spray Apply 1 application topically every  Monday, Wednesday, and Friday with hemodialysis. 12/25/17   [provider]  hydrALAZINE (APRESOLINE) 25 MG tablet Take 1 tablet (25 mg total) by mouth 3 (three) times daily. 09/28/20   Adrian Prows, MD  HYDROcodone-acetaminophen (NORCO/VICODIN) 5-325 MG tablet Take 1 tablet by mouth every 12 (twelve) hours as needed for moderate pain. 10/23/20   [name], [name], MD  isosorbide dinitrate (ISORDIL) 30 MG tablet Take 1 tablet (30 mg total) by mouth 3 (three) times daily. 09/28/20   Adrian Prows, MD  LINZESS 145 MCG CAPS capsule TAKE 1 CAPSULE BY MOUTH ONCE DAILY BEFORE  BREAKFAST Patient taking differently: Take 145 mcg by mouth daily before breakfast. 11/08/20   Pyrtle, Lajuan Lines, MD  Menthol, Topical Analgesic, (BIOFREEZE ROLL-ON) 4 % GEL Apply 1 application topically daily as needed (pain).    [provider]  omeprazole (PRILOSEC) 40 MG capsule TAKE 1 CAPSULE BY MOUTH TWICE DAILY BEFORE A MEAL Patient taking differently: Take 40 mg by mouth 2 (two) times daily. 09/28/20   Mansouraty, Telford Nab., MD  warfarin (COUMADIN) 5 MG tablet USE AS DIRECTED BY COUMADIN CLINIC Patient taking differently: Take 2.5-5 mg by mouth See admin instructions. USE AS DIRECTED BY COUMADIN CLINIC Take 2.5mg  on M,W,F and 5mg  all other days 11/09/20   [name], [name], MD    Allergies    Latex, Penicillins, Sulfa antibiotics, and Tape  Review of Systems   Review of Systems  Constitutional: Negative for chills and fever.  HENT: Negative for ear pain and sore throat.   Eyes: Negative for pain and visual disturbance.  Respiratory: Negative for cough and shortness of breath.   Cardiovascular: Positive for chest pain. Negative for palpitations.  Gastrointestinal: Negative for abdominal pain and vomiting.  Genitourinary: Negative for dysuria and hematuria.  Musculoskeletal: Negative for arthralgias and back pain.  Skin: Negative for color change and rash.  Neurological: Negative for seizures and syncope.  All other  systems reviewed and are negative.   Physical Exam Updated Vital Signs BP (!) 161/76 (BP Location: Left Arm)   Pulse 60   Resp 19   Ht 5\' 4"  (1.626 m)   Wt 98.4 kg   SpO2 96%   BMI 37.25 kg/m   Physical Exam Vitals and nursing note reviewed.  Constitutional:      General: She is not in acute distress.    Appearance: She is well-developed.  HENT:     Head: Normocephalic and atraumatic.  Eyes:     Conjunctiva/sclera: Conjunctivae normal.  Cardiovascular:     Rate and Rhythm: Normal rate and regular rhythm.     Heart sounds: No murmur heard.   Pulmonary:     Effort: Pulmonary effort is normal. No respiratory distress.     Breath sounds: Normal breath sounds. No decreased breath sounds or wheezing.  Abdominal:     Palpations: Abdomen is soft.     Tenderness: There is no abdominal tenderness.  Musculoskeletal:     Cervical back: Neck supple.  Skin:    General: Skin is warm and dry.  Neurological:     Mental Status: She is alert.     ED Results / Procedures / Treatments   Labs (all labs ordered are listed, but only abnormal results are displayed) Labs Reviewed  BASIC METABOLIC PANEL - Abnormal; Notable for the following components:      Result Value   Potassium 2.9 (*)    Glucose, Bld 106 (*)    BUN 40 (*)    Creatinine, Ser 6.02 (*)    Calcium 8.2 (*)    GFR, Estimated 7 (*)    All other components within normal limits  CBC WITH DIFFERENTIAL/PLATELET - Abnormal; Notable for the following components:   RBC 3.83 (*)    Hemoglobin 10.4 (*)    HCT 34.2 (*)    RDW 18.8 (*)    Eosinophils Absolute 0.9 (*)    All other components within normal limits  TROPONIN I (HIGH SENSITIVITY) - Abnormal; Notable for the following components:   Troponin I (High Sensitivity) 31 (*)    All other components within normal limits  MAGNESIUM  CBC WITH DIFFERENTIAL/PLATELET  TROPONIN I (HIGH SENSITIVITY)    EKG EKG Interpretation  Date/Time:  Monday Nov 26 2020 12:10:49  EDT Ventricular Rate:  136 PR Interval:  207 QRS Duration: 93 QT Interval:  347 QTC Calculation: 522 R Axis:   -69 Text Interpretation: Junctional tachycardia Left anterior fascicular block Minimal ST depression, inferior leads Prolonged QT interval Since last tracing rate faster Confirmed by Noemi Chapel [phone]) on 11/26/2020 3:10:47 PM   Radiology No results found.  Procedures Procedures   Medications Ordered in ED Medications  carvedilol (COREG) tablet 25 mg (25 mg Oral Given 11/26/20 0939)  diltiazem (CARDIZEM) injection 20 mg (20 mg Intravenous Given 11/26/20 1215)  diltiazem (CARDIZEM) tablet 30 mg (30 mg Oral Given 11/26/20 1217)    ED Course  I have reviewed the triage vital signs and the nursing notes.  Pertinent labs & imaging results that were available during my care of the patient were reviewed by me and considered in my medical decision making (see chart for details).    MDM Rules/Calculators/A&P                          Patient presents with chest pain.  Symptoms very similar to previous episode of A. fib with RVR.  Resolved now.  Differential diagnosis includes symptomatic A. fib with RVR, ACS.  Symptoms are resolved now so will obtain troponin and repeat troponin to evaluate for ACS.  She is in sinus rhythm here with a rate of 85 on EKG, no focal ischemic changes, no pathologic T wave inversions, no ST changes, no conduction delays.  History and physical was not consistent with other etiology such as myocarditis, pericarditis, aortic dissection, musculoskeletal chest pain.  Labs show Tn 15. K low; this is true after HD, probably does not need potassium given ESRD and wouldn't be expected  to clear K well. ECG shows NSR no focal ischemic changes During stay in ED, pt developed RVR. She had received home coreg on suspicion of afib with RVR to attempt to prevent this; she was HDS when she went into RVR, got PO and IV dilt, and returned to NSR.  Repeat Tn elevated, roughly  doubled. Plan is for admission due to this. D/w cardiology and their recs were pending at time of handoff to Dr. Miki Kins at 1500.    Final Clinical Impression(s) / ED Diagnoses Final diagnoses:  None    Rx / DC Orders ED Discharge Orders         Ordered    Amb referral to AFIB Clinic        11/26/20 1205           Aris Lot, MD 11/26/20 1628    Fredia Sorrow, MD 12/05/20 [phone]

## 2020-11-26 NOTE — Telephone Encounter (Signed)
Inbound call from patient stating she has a procedure scheduled for 5/12 but is currently admitted but will be discharged today.  Wants to know if she needs to reschedule.  Please advise.

## 2020-11-26 NOTE — ED Triage Notes (Signed)
From dialysis with CP center chest that radiated neck and shoulders bilaterally, also c/o tightening in face. 1/2 of dial;ysis completed. Was seen in ED for Saturday for CP and HR 140's. currently pain free.

## 2020-11-27 ENCOUNTER — Ambulatory Visit: Payer: Self-pay | Admitting: Pharmacist

## 2020-11-27 ENCOUNTER — Telehealth: Payer: Self-pay | Admitting: Pharmacist

## 2020-11-27 ENCOUNTER — Other Ambulatory Visit (HOSPITAL_COMMUNITY): Payer: Medicare Other

## 2020-11-27 DIAGNOSIS — Z7901 Long term (current) use of anticoagulants: Secondary | ICD-10-CM

## 2020-11-27 DIAGNOSIS — I480 Paroxysmal atrial fibrillation: Secondary | ICD-10-CM

## 2020-11-27 DIAGNOSIS — Z5181 Encounter for therapeutic drug level monitoring: Secondary | ICD-10-CM

## 2020-11-27 NOTE — Telephone Encounter (Signed)
I called and spoke with the patient today after reviewing her chart.  She has been in/out of the hospital with A. fib with RVR and just initiated on amiodarone.  Patient is quite tired and feels debilitated at this time.  I think it is best for [place] to reschedule her procedure for 4 to 8 weeks out from now since she had an opportunity to follow-up with cardiology before we put her through another procedure. Please go ahead and cancel Thursday's procedure and work on rescheduling as able in 4 to 8 weeks.  Thank you. FYI Dr. Hilarie Fredrickson. GM

## 2020-11-27 NOTE — Progress Notes (Signed)
Recent start amiodarone 200 mg BID starting on 11/26/20. Pt called GI and stated that her scheduled EGD procedure on 11/29/20 has been cancelled. Pt has been holding her warfarin form 5/7-5/9 as directed for the anticipated EGD procedure. Pt to restart on 2.5 mg daily starting today and check INR on 11/29/20.

## 2020-11-27 NOTE — Telephone Encounter (Signed)
Pt left a message with answering service requesting to cancel her procedure on 11/29/20 and COVID test on 11/27/20.

## 2020-11-27 NOTE — Telephone Encounter (Signed)
Left message on machine to call back  

## 2020-11-27 NOTE — Telephone Encounter (Signed)
Called to review recent ER admissions with pt. Pt was noted to be in Afib. Pt was started on amiodarone 200 mg BID on 11/26/20. Pt was initially scheduled for EGD on 11/29/20 and was directed to hold her warfarin from 5/7-5/12. Pt reports that she called her GI provider to cancel her scheduled EGD on 11/29/20 in setting of recent cardiac concerns. Reviewed with pt to restart her warfarin today starting at 2.5 mg daily and will recheck INR on 11/29/20. Will need close monitoring and follow up in setting of known significant DDI between warfarin and amiodarone.   Pt reports that her chest pain and palpitation concerns have improved since starting amiodarone. Discussed the need to schedule OV appt with [facility] following recent hospital admission. Pt adamant that she would like to see Dr. Einar Gip moving forward. Reviewed that Dr. Irven Shelling schedule is already booked over the next few weeks and the need for having pt assessed in the office sooner; but pt still requesting to wait to see Dr. Einar Gip instead.

## 2020-11-28 DIAGNOSIS — N186 End stage renal disease: Secondary | ICD-10-CM | POA: Diagnosis not present

## 2020-11-28 DIAGNOSIS — I4891 Unspecified atrial fibrillation: Secondary | ICD-10-CM | POA: Diagnosis not present

## 2020-11-28 DIAGNOSIS — N2581 Secondary hyperparathyroidism of renal origin: Secondary | ICD-10-CM | POA: Diagnosis not present

## 2020-11-28 DIAGNOSIS — D631 Anemia in chronic kidney disease: Secondary | ICD-10-CM | POA: Diagnosis not present

## 2020-11-28 DIAGNOSIS — Z992 Dependence on renal dialysis: Secondary | ICD-10-CM | POA: Diagnosis not present

## 2020-11-28 NOTE — Telephone Encounter (Signed)
The pt has been rescheduled to 01/31/21 at [facility] with Dr Rush Landmark  COVID test on 01/28/21   EGD scheduled, pt instructed and medications reviewed.  Patient instructions mailed to home.  Patient to call with any questions or concerns.

## 2020-11-29 ENCOUNTER — Ambulatory Visit: Payer: Self-pay | Admitting: Cardiology

## 2020-11-29 ENCOUNTER — Ambulatory Visit: Payer: Medicare Other | Admitting: Cardiology

## 2020-11-29 ENCOUNTER — Ambulatory Visit (HOSPITAL_COMMUNITY): Admission: RE | Admit: 2020-11-29 | Payer: Medicare Other | Source: Ambulatory Visit | Admitting: Gastroenterology

## 2020-11-29 ENCOUNTER — Encounter (HOSPITAL_COMMUNITY): Admission: RE | Payer: Self-pay | Source: Ambulatory Visit

## 2020-11-29 ENCOUNTER — Other Ambulatory Visit: Payer: Self-pay

## 2020-11-29 ENCOUNTER — Encounter: Payer: Self-pay | Admitting: Cardiology

## 2020-11-29 VITALS — BP 189/76 | HR 74 | Temp 98.0°F | Resp 17 | Ht 64.0 in | Wt 217.0 lb

## 2020-11-29 DIAGNOSIS — Z5181 Encounter for therapeutic drug level monitoring: Secondary | ICD-10-CM

## 2020-11-29 DIAGNOSIS — I480 Paroxysmal atrial fibrillation: Secondary | ICD-10-CM | POA: Diagnosis not present

## 2020-11-29 DIAGNOSIS — I6523 Occlusion and stenosis of bilateral carotid arteries: Secondary | ICD-10-CM | POA: Diagnosis not present

## 2020-11-29 DIAGNOSIS — Z7901 Long term (current) use of anticoagulants: Secondary | ICD-10-CM

## 2020-11-29 DIAGNOSIS — E7800 Pure hypercholesterolemia, unspecified: Secondary | ICD-10-CM

## 2020-11-29 DIAGNOSIS — I129 Hypertensive chronic kidney disease with stage 1 through stage 4 chronic kidney disease, or unspecified chronic kidney disease: Secondary | ICD-10-CM | POA: Diagnosis not present

## 2020-11-29 LAB — POCT INR: INR: 1.4 — AB (ref 2.0–3.0)

## 2020-11-29 LAB — ALPHA GALACTOSIDASE: Alpha-Galactosidase activity: 13.4 nmol/hr/mg prt — ABNORMAL LOW (ref >35.5)

## 2020-11-29 SURGERY — ESOPHAGOGASTRODUODENOSCOPY (EGD) WITH PROPOFOL
Anesthesia: Monitor Anesthesia Care

## 2020-11-29 MED ORDER — DILTIAZEM HCL 60 MG PO TABS: 60.0000 mg | ORAL_TABLET | Freq: Three times a day (TID) | ORAL | 2 refills | Status: DC | PRN

## 2020-11-29 MED ORDER — CARVEDILOL 25 MG PO TABS: 37.5000 mg | ORAL_TABLET | Freq: Two times a day (BID) | ORAL | 3 refills | Status: DC

## 2020-11-29 MED ORDER — ISOSORBIDE DINITRATE 30 MG PO TABS: 30.0000 mg | ORAL_TABLET | Freq: Three times a day (TID) | ORAL | 3 refills | Status: DC

## 2020-11-29 MED ORDER — HYDRALAZINE HCL 25 MG PO TABS: 25.0000 mg | ORAL_TABLET | Freq: Three times a day (TID) | ORAL | 3 refills | Status: DC

## 2020-11-29 NOTE — Progress Notes (Deleted)
Duplicate encounter

## 2020-11-29 NOTE — Progress Notes (Signed)
Primary Physician/Referring:  [name], [name], MD  [name], female    DOB: 04-12-1946, 75 y.o.   MRN: [id number]  Chief Complaint  Patient presents with  . Atrial Fibrillation   HPI:    Jane [name]  is a 75 y.o. female with paroxysmal atrial fibrillation, on low-dose amiodarone, dose reduced due to prolonged QT and eventually discontinued, end-stage disease on hemodialysis on Monday Wednesday and Friday, orthostatic hypotension, obstructive sleep apnea not on CPAP, external hemorrhoids but tolerating Coumadin. She presented with chest pain and A. fib with RVR to the emergency room on 11/26/2020 and received 1 dose of diltiazem with improvement in heart rate and was recommended to start amiodarone at 200 mg twice daily.  Hydralazine and isosorbide dinitrate was discontinued as blood pressure was borderline.  Patient now presents and states that she has not started taking amiodarone yet as she was concerned and wanted to discuss with me.  She is accompanied by her daughter.  States that she is presently doing well.  States that she experiences severe palpitations, chest pain and not feeling well when she had A. fib with RVR.  She has not had recurrence.  She is tolerating Coumadin and is being followed in our Coumadin clinic.  She denies any hemorrhoidal bleed, dark stools or bloody stools.  Past Medical History:  Diagnosis Date  . Acid reflux   . Anemia of chronic disease   . Arthritis   . Asthma   . Atrial fibrillation ([place])   . Bilateral carotid bruits   . Complication of anesthesia    "hard to wake up, I have sleep apnea" no CPAP  . Depression   . Diverticulitis   . Duodenal ulcer   . Dysrhythmia    Afib  . ESRD (end stage renal disease) ([place])    MWF [place]  . Headache   . History of blood transfusion   . Hypertension   . Malaise and fatigue   . Orthostatic hypotension   . Shortness of breath    " when I walk to fast"  . Sleep apnea   . Syncope    . Tubulovillous adenoma of colon    Past Surgical History:  Procedure Laterality Date  . A/V FISTULAGRAM Right 10/18/2020   Procedure: A/V FISTULAGRAM;  Surgeon: Marty Heck, MD;  Location: [place] [facility];  Service: Cardiovascular;  Laterality: Right;  . AV FISTULA PLACEMENT    . BACK SURGERY     Lumbar fusion L 4 and L 5  . BIOPSY  01/09/2020   Procedure: BIOPSY;  Surgeon: Rush Landmark Telford Nab., MD;  Location: [place];  Service: Gastroenterology;;  . COLONOSCOPY    . ENDOSCOPIC MUCOSAL RESECTION N/A 01/09/2020   Procedure: ENDOSCOPIC MUCOSAL RESECTION;  Surgeon: Rush Landmark Telford Nab., MD;  Location: [place];  Service: Gastroenterology;  Laterality: N/A;  . ESOPHAGOGASTRODUODENOSCOPY    . ESOPHAGOGASTRODUODENOSCOPY (EGD) WITH PROPOFOL N/A 01/09/2020   Procedure: ESOPHAGOGASTRODUODENOSCOPY (EGD) WITH PROPOFOL;  Surgeon: Rush Landmark Telford Nab., MD;  Location: [place];  Service: Gastroenterology;  Laterality: N/A;  . ESOPHAGOGASTRODUODENOSCOPY (EGD) WITH PROPOFOL N/A 01/13/2020   Procedure: ESOPHAGOGASTRODUODENOSCOPY (EGD) WITH PROPOFOL;  Surgeon: Irene Shipper, MD;  Location: [place];  Service: Gastroenterology;  Laterality: N/A;  . EUS N/A 01/09/2020   Procedure: UPPER ENDOSCOPIC ULTRASOUND (EUS) RADIAL;  Surgeon: Rush Landmark Telford Nab., MD;  Location: [place];  Service: Gastroenterology;  Laterality: N/A;  . HEMOSTASIS CLIP PLACEMENT  01/09/2020   Procedure: HEMOSTASIS  CLIP PLACEMENT;  Surgeon: Mansouraty, Telford Nab., MD;  Location: [place];  Service: Gastroenterology;;  . HEMOSTASIS CONTROL  01/13/2020   Procedure: HEMOSTASIS CONTROL;  Surgeon: Irene Shipper, MD;  Location: [facility];  Service: Gastroenterology;;  Vassie Loll  . HOT HEMOSTASIS N/A 01/13/2020   Procedure: HOT HEMOSTASIS (ARGON PLASMA COAGULATION/BICAP);  Surgeon: Irene Shipper, MD;  Location: [place];  Service: Gastroenterology;  Laterality: N/A;  . IR GENERIC HISTORICAL   07/09/2016   IR US GUIDE VASC ACCESS RIGHT 07/09/2016 Arne Cleveland, MD [facility]  . IR GENERIC HISTORICAL  07/09/2016   IR FLUORO GUIDE CV LINE RIGHT 07/09/2016 Arne Cleveland, MD [facility]  . KNEE ARTHROPLASTY Left   . LAPAROSCOPIC SIGMOID COLECTOMY N/A 07/11/2016   Procedure: LAPAROSCOPIC SIGMOID COLECTOMY;  Surgeon: Clovis Riley, MD;  Location: [place];  Service: General;  Laterality: N/A;  . MASS EXCISION Right 04/10/2020   Procedure: EXCISION SKIN NODULE RIGHT FOREARM;  Surgeon: Waynetta Sandy, MD;  Location: [place];  Service: Vascular;  Laterality: Right;  . SCLEROTHERAPY  01/09/2020   Procedure: Clide Deutscher;  Surgeon: Mansouraty, Telford Nab., MD;  Location: [place];  Service: Gastroenterology;;  . Clide Deutscher  01/13/2020   Procedure: Clide Deutscher;  Surgeon: Irene Shipper, MD;  Location: [facility] ENDOSCOPY;  Service: Gastroenterology;;  . Maryagnes Amos INJECTION  01/09/2020   Procedure: SUBMUCOSAL LIFTING INJECTION;  Surgeon: Irving Copas., MD;  Location: [facility] ENDOSCOPY;  Service: Gastroenterology;;  . TUBAL LIGATION     Family History  Problem Relation Age of Onset  . Heart failure Mother   . Stroke Mother   . Other Father   . Colon cancer Neg Hx   . Liver disease Neg Hx   . Esophageal cancer Neg Hx   . Stomach cancer Neg Hx   . Inflammatory bowel disease Neg Hx   . Rectal cancer Neg Hx   . Pancreatic cancer Neg Hx     Social History   Tobacco Use  . Smoking status: Never Smoker  . Smokeless tobacco: Never Used  Substance Use Topics  . Alcohol use: No   ROS  Review of Systems  Constitutional: Negative for malaise/fatigue.  Cardiovascular: Positive for palpitations. Negative for chest pain, dyspnea on exertion, leg swelling and syncope.  Gastrointestinal: Positive for constipation and hemorrhoids (not active). Negative for melena.   Objective  Blood pressure (!) 189/76, pulse 74, temperature 98 F (36.7 C), resp. rate 17, height  5\' 4"  (1.626 m), weight 217 lb (98.4 kg), SpO2 97 %.  Vitals with BMI 11/29/2020 11/26/2020 11/26/2020  Height 5\' 4"  - -  Weight 217 lbs - -  BMI 73.41 - -  Systolic 937 902 409  Diastolic 76 98 69  Pulse 74 72 73   Orthostatic VS for the past 72 hrs (Last 3 readings):  Patient Position BP Location Cuff Size  11/29/20 0849 Sitting Left Arm Large     Physical Exam Constitutional:      Comments: Moderately obese  Cardiovascular:     Rate and Rhythm: Normal rate and regular rhythm.     Pulses: Intact distal pulses.          Carotid pulses are on the right side with bruit and on the left side with bruit.    Heart sounds: Murmur heard.   Early systolic murmur is present with a grade of 2/6 at the upper right sternal border. No gallop.      Comments: No leg edema, no JVD. Right arm  AV fistula noted and functioning. Pulmonary:     Effort: Pulmonary effort is normal.     Breath sounds: Normal breath sounds.  Abdominal:     General: Bowel sounds are normal.     Palpations: Abdomen is soft.    Laboratory examination:   Recent Labs    01/16/20 0305 01/17/20 0408 01/18/20 0714 04/10/20 0810 11/20/20 1025 11/24/20 0920 11/26/20 0845  NA 137 137 137   < > 139 139 138  K 4.1 3.7 3.7   < > 3.6 3.6 2.9*  CL 102 100 99   < > 99 100 103  CO2 24 27 26   --  26 29 28   GLUCOSE 100* 100* 111*   < > 91 110* 106*  BUN 46* 16 25*   < > 49* 45* 40*  CREATININE 11.44* 6.36* 9.19*   < > 7.57* 6.95* 6.02*  CALCIUM 8.0* 8.9 8.7*  --  10.1 9.4 8.2*  GFRNONAA 3* 6* 4*  --   --  6* 7*  GFRAA 3* 7* 4*  --   --   --   --    < > = values in this interval not displayed.   estimated creatinine clearance is 9.1 mL/min (A) (by C-G formula based on SCr of 6.02 mg/dL (H)).  CMP Latest Ref Rng & Units 11/26/2020 11/24/2020 11/20/2020  Glucose 70 - 99 mg/dL 106(H) 110(H) 91  BUN 8 - 23 mg/dL 40(H) 45(H) 49(H)  Creatinine 0.44 - 1.00 mg/dL 6.02(H) 6.95(H) 7.57(HH)  Sodium 135 - 145 mmol/L 138 139 139  Potassium  3.5 - 5.1 mmol/L 2.9(L) 3.6 3.6  Chloride 98 - 111 mmol/L 103 100 99  CO2 22 - 32 mmol/L 28 29 26   Calcium 8.9 - 10.3 mg/dL 8.2(L) 9.4 10.1  Total Protein 6.5 - 8.1 g/dL - 7.3 8.3  Total Bilirubin 0.3 - 1.2 mg/dL - 0.6 0.7  Alkaline Phos 38 - 126 U/L - 100 106  AST 15 - 41 U/L - 17 12  ALT 0 - 44 U/L - 15 10   CBC Latest Ref Rng & Units 11/26/2020 11/24/2020 11/20/2020  WBC 4.0 - 10.5 K/uL 9.3 6.9 6.5  Hemoglobin 12.0 - 15.0 g/dL 10.4(L) 11.8(L) 11.7(L)  Hematocrit 36.0 - 46.0 % 34.2(L) 38.9 35.7(L)  Platelets 150 - 400 K/uL 234 273 196.0   Lipid Panel     Component Value Date/Time   CHOL 198 10/06/2019 1510   TRIG 51 10/06/2019 1510   HDL 53 10/06/2019 1510   CHOLHDL 3.7 10/06/2019 1510   VLDL 10 10/06/2019 1510   LDLCALC 135 (H) 10/06/2019 1510    Lipid Panel No results for input(s): CHOL, TRIG, LDLCALC, VLDL, HDL, CHOLHDL, LDLDIRECT in the last 8760 hours. HEMOGLOBIN A1C No results found for: HGBA1C, MPG   TSH Recent Labs    01/13/20 0733 11/20/20 1025  TSH 0.351 1.36    Medications and allergies   Allergies  Allergen Reactions  . Latex Rash  . Penicillins Other (See Comments)    Yeast infection / Childhood  . Sulfa Antibiotics Rash  . Tape Other (See Comments)    Plastic, silicone, and paper tape causes bruising and pulls off skin. Cloth tape works fine     Current Outpatient Medications on File Prior to Visit  Medication Sig Dispense Refill  . albuterol (PROVENTIL HFA;VENTOLIN HFA) 108 (90 Base) MCG/ACT inhaler Inhale 1 puff into the lungs every 6 (six) hours as needed for wheezing or shortness of breath. (Patient taking  differently: Inhale 2 puffs into the lungs every 6 (six) hours as needed for wheezing or shortness of breath.) 6.7 g 2  . amiodarone (PACERONE) 200 MG tablet Take 1 tablet (200 mg total) by mouth 2 (two) times daily. 60 tablet 0  . amLODipine (NORVASC) 5 MG tablet Take 5 mg by mouth at bedtime.    . B Complex-C-Zn-Folic Acid (DIALYVITE 935 WITH  ZINC) 0.8 MG TABS Take 1 tablet by mouth daily.    . DULoxetine (CYMBALTA) 20 MG capsule Take 2 capsules (40 mg total) by mouth daily. 180 capsule 2  . ethyl chloride spray Apply 1 application topically every Monday, Wednesday, and Friday with hemodialysis.  12  . HYDROcodone-acetaminophen (NORCO/VICODIN) 5-325 MG tablet Take 1 tablet by mouth every 12 (twelve) hours as needed for moderate pain. 60 tablet 0  . LINZESS 145 MCG CAPS capsule TAKE 1 CAPSULE BY MOUTH ONCE DAILY BEFORE  BREAKFAST (Patient taking differently: Take 145 mcg by mouth daily before breakfast.) 30 capsule 0  . Menthol, Topical Analgesic, (BIOFREEZE ROLL-ON) 4 % GEL Apply 1 application topically daily as needed (pain).    Marland Kitchen omeprazole (PRILOSEC) 40 MG capsule TAKE 1 CAPSULE BY MOUTH TWICE DAILY BEFORE A MEAL (Patient taking differently: Take 40 mg by mouth 2 (two) times daily.) 60 capsule 0  . warfarin (COUMADIN) 5 MG tablet USE AS DIRECTED BY COUMADIN CLINIC (Patient taking differently: Take 2.5-5 mg by mouth See admin instructions. USE AS DIRECTED BY COUMADIN CLINIC Take 2.5mg  on M,W,F and 5mg  all other days) 113 tablet 5   No current facility-administered medications on file prior to visit.   Radiology:   No results found.  Cardiac Studies:   Echocardiogram 11/24/2017: Left ventricle cavity is normal in size. Mild concentric hypertrophy of the left ventricle. Normal global wall motion. Doppler evidence of grade II (pseudonormal) diastolic dysfunction, elevated LAP. Calculated EF 65%. Left atrial cavity is severely dilated. Mildly restricted aortic valve leaflets with trace aortic stenosis. Aortic valve mean gradient of 6 mmHg, Vmax of 1.8 m/s. Calculated aortic valve area by continuity equation is 2.1 cm. Mild (Grade I) mitral regurgitation. Mild to moderate tricuspid regurgitation. Estimated pulmonary artery systolic pressure 70-17 mmHg.  Lexiscan myoview stress test 09/06/2018:  1. Lexiscan stress test was  performed. Exercise capacity was not assessed. Stress symptoms included dyspnea, dizziness. Peak effect blood pressure was 158/68 mmHg. The resting and stress electrocardiogram demonstrated normal sinus rhythm, normal resting conduction, no resting arrhythmias and normal rest repolarization. Stress EKG is non diagnostic for ischemia as it is a pharmacologic stress.  2. The overall quality of the study is good. There is no evidence of abnormal lung activity. Stress and rest SPECT images demonstrate homogeneous tracer distribution throughout the myocardium. Gated SPECT imaging reveals normal myocardial thickening and wall motion. The left ventricular ejection fraction was normal (61%).  3. Low risk study.  Carotid artery duplex  04/24/2020: Stenosis in the right external carotid artery (<50%). Minimal stenosis in the left internal carotid artery (minimal). Stenosis in the left common carotid artery (<50%). There is mild diffuse heterogeneous plaque in bilateral carotid arteries. Antegrade right vertebral artery flow.  Follow up in one year is appropriate if clinically indicated. No significant change from 12/02/2016.  EKG  EKG 11/29/2020: Normal sinus rhythm at rate of 78 bpm, left atrial enlargement, left axis deviation, left anterior fascicular block.  Poor R wave progression, cannot exclude anteroseptal infarct old.  No evidence of ischemia.  Normal QT interval.   EKG  11/26/2020: Atypical atrial flutter with RVR at rate of 136 bpm, left axis deviation, left anterior fascicular block.  No evidence of ischemia.   EKG 10/06/2019: Normal sinus rhythm with rate of 67 bpm, left atrial enlargement, left axis deviation, left anterior fascicular block.  Anteroseptal infarct old.  No evidence of ischemia.  Low-voltage complexes.  No significant change from EKG 04/12/2019.   Assessment     ICD-10-CM   1. Paroxysmal atrial fibrillation (HCC)  I48.0 EKG 12-Lead    carvedilol (COREG) 25 MG tablet     PCV ECHOCARDIOGRAM COMPLETE    diltiazem (CARDIZEM) 60 MG tablet  2. Monitoring for long-term anticoagulant use  Z51.81    Z79.01   3. Hypertension with renal disease  I12.9 isosorbide dinitrate (ISORDIL) 30 MG tablet    carvedilol (COREG) 25 MG tablet    hydrALAZINE (APRESOLINE) 25 MG tablet  4. Hypercholesteremia  E78.00     CHA2DS2-VASc Score is 4.  Yearly risk of stroke: 4.8% (HTN, female, age 40).  Score of 1=0.6; 2=2.2; 3=3.2; 4=4.8; 5=7.2; 6=9.8; 7=>9.8) -(CHF; HTN; vasc disease DM,  Female = 1; Age <65 =0; 65-74 = 1,  >75 =2; stroke/embolism= 2).   Meds ordered this encounter  Medications  . isosorbide dinitrate (ISORDIL) 30 MG tablet    Sig: Take 1 tablet (30 mg total) by mouth 3 (three) times daily.    Dispense:  270 tablet    Refill:  3  . carvedilol (COREG) 25 MG tablet    Sig: Take 1.5 tablets (37.5 mg total) by mouth 2 (two) times daily with a meal.    Dispense:  240 tablet    Refill:  3  . hydrALAZINE (APRESOLINE) 25 MG tablet    Sig: Take 1 tablet (25 mg total) by mouth 3 (three) times daily.    Dispense:  270 tablet    Refill:  3  . diltiazem (CARDIZEM) 60 MG tablet    Sig: Take 1 tablet (60 mg total) by mouth 3 (three) times daily as needed (Atrial fibrillation).    Dispense:  30 tablet    Refill:  2    Medications Discontinued During This Encounter  Medication Reason  . 0.9 %  sodium chloride infusion Completed Course  . sodium chloride flush (NS) 0.9 % injection 3 mL Completed Course  . sodium chloride flush (NS) 0.9 % injection 3 mL Completed Course  . sucralfate (CARAFATE) 1 g tablet Patient Preference  . hydrALAZINE (APRESOLINE) 25 MG tablet Reorder  . isosorbide dinitrate (ISORDIL) 30 MG tablet Reorder  . carvedilol (COREG) 25 MG tablet Reorder     Recommendations:   Demetrice Amstutz  is a 75 y.o. AA female patient with paroxysmal atrial fibrillation, on low-dose amiodarone, dose reduced due to prolonged QT, end-stage disease on hemodialysis on Monday  Wednesday and Friday, orthostatic hypotension, obstructive sleep apnea not on CPAP, external hemorrhoids but tolerating Coumadin. She presented with chest pain and A. fib with RVR to the emergency room on 11/26/2020 and received 1 dose of diltiazem with improvement in heart rate and was recommended to increase amiodarone to 200 mg twice daily.  Isosorbide dinitrate and hydralazine was also discontinued in the emergency room, patient has been concerned about the changes, she has not stated taking amiodarone yet.  Advised her that in view of prior increased QT interval with high-dose amiodarone, would avoid it for now and we could certainly consider Multaq if necessary.  For now I have given her prescription for diltiazem 60 mg  3 times daily as needed as she converted to sinus rhythm when she received IV diltiazem in the emergency room.  I will prescribe her diltiazem plain 60 mg 3 times daily as needed to be taken if she has episodes of A. fib with RVR and then she is advised to call us in the office to avoid ED visits.  I will also increase carvedilol from 25 mg twice daily to 37.5 mg twice daily both for hypertension and atrial fibrillation.  She will restart isosorbide dinitrate and hydralazine (Stopped in the ED) as it had controlled her blood pressure very well.  In spite of multiple antihypertensive medication, she remains hypertensive, and has not had any problems with dialysis.  I will repeat echocardiogram and see her back in 4 weeks.   While reviewing her labs, I realized that her LDL is not at goal in view of abdominal aortic atherosclerosis noted on CT scan previously, multiple cardiovascular risk factors and also renal disease she would benefit from being on a statin.  I will discuss this on her next office visit.  INR is being managed in our clinic and she will follow the guidelines.  Records reviewed, 40-minute encounter.    Adrian Prows, MD, [facility] 11/29/2020, 8:22 PM Office: [phone]

## 2020-11-29 NOTE — Patient Instructions (Signed)
INR below goal. Take 7.5 mg today and 5 mg tomorrow, then return to previous maintenance dose of 2.5 mg every Mon, Wed, Fri and 5 mg all other days. Recheck INR in 5 days.

## 2020-11-29 NOTE — Progress Notes (Deleted)
Entered in error

## 2020-11-30 DIAGNOSIS — D631 Anemia in chronic kidney disease: Secondary | ICD-10-CM | POA: Diagnosis not present

## 2020-11-30 DIAGNOSIS — N2581 Secondary hyperparathyroidism of renal origin: Secondary | ICD-10-CM | POA: Diagnosis not present

## 2020-11-30 DIAGNOSIS — N186 End stage renal disease: Secondary | ICD-10-CM | POA: Diagnosis not present

## 2020-11-30 DIAGNOSIS — Z992 Dependence on renal dialysis: Secondary | ICD-10-CM | POA: Diagnosis not present

## 2020-12-03 DIAGNOSIS — N186 End stage renal disease: Secondary | ICD-10-CM | POA: Diagnosis not present

## 2020-12-03 DIAGNOSIS — D631 Anemia in chronic kidney disease: Secondary | ICD-10-CM | POA: Diagnosis not present

## 2020-12-03 DIAGNOSIS — Z992 Dependence on renal dialysis: Secondary | ICD-10-CM | POA: Diagnosis not present

## 2020-12-03 DIAGNOSIS — N2581 Secondary hyperparathyroidism of renal origin: Secondary | ICD-10-CM | POA: Diagnosis not present

## 2020-12-04 ENCOUNTER — Other Ambulatory Visit: Payer: Self-pay

## 2020-12-04 ENCOUNTER — Ambulatory Visit: Payer: Medicare Other | Admitting: Pharmacist

## 2020-12-04 DIAGNOSIS — M722 Plantar fascial fibromatosis: Secondary | ICD-10-CM | POA: Diagnosis not present

## 2020-12-04 DIAGNOSIS — Z5181 Encounter for therapeutic drug level monitoring: Secondary | ICD-10-CM

## 2020-12-04 DIAGNOSIS — I480 Paroxysmal atrial fibrillation: Secondary | ICD-10-CM

## 2020-12-04 DIAGNOSIS — M76822 Posterior tibial tendinitis, left leg: Secondary | ICD-10-CM | POA: Diagnosis not present

## 2020-12-04 DIAGNOSIS — M25571 Pain in right ankle and joints of right foot: Secondary | ICD-10-CM | POA: Diagnosis not present

## 2020-12-04 DIAGNOSIS — Z7901 Long term (current) use of anticoagulants: Secondary | ICD-10-CM

## 2020-12-04 DIAGNOSIS — R531 Weakness: Secondary | ICD-10-CM | POA: Diagnosis not present

## 2020-12-04 DIAGNOSIS — R262 Difficulty in walking, not elsewhere classified: Secondary | ICD-10-CM | POA: Diagnosis not present

## 2020-12-04 DIAGNOSIS — M25572 Pain in left ankle and joints of left foot: Secondary | ICD-10-CM | POA: Diagnosis not present

## 2020-12-04 LAB — POCT INR: INR: 2.4 (ref 2.0–3.0)

## 2020-12-04 NOTE — Patient Instructions (Signed)
INR at goal. Continue current weekly dose of 2.5 mg every Mon, Wed, Fri and 5 mg all other days. Recheck INR in 1 week

## 2020-12-04 NOTE — Progress Notes (Signed)
Anticoagulation Management Jane [name] is a 75 y.o. female who reports to the clinic for monitoring of warfarin treatment.    Indication: atrial fibrillation CHA2DS2 Vasc Score 4 (Age >76, female, HTN hx), HAS-BLED 2 (Age>65, renal disease)  Duration: indefinite Supervising physician: Adrian Prows  Anticoagulation Clinic Visit History:  Patient does not report signs/symptoms of bleeding or thromboembolism.  Other recent changes: Pt continues to have persistent chronic pain in her back, knee, and neck. Significantly affecting patient's QOLs and ADLs. Working with physical therapy to help address pain.   EGD + EMR procedure rescheduled for 01/31/21. Pt restarted on warfarin on 11/27/20  Anticoagulation Episode Summary    Current INR goal:  2.0-3.0  TTR:  65.8 % (2.2 y)  Next INR check:  12/11/2020  INR from last check:  2.4 (12/04/2020)  Weekly max warfarin dose:    Target end date:  Indefinite  INR check location:    Preferred lab:    Send INR reminders to:     Indications   Paroxysmal atrial fibrillation (HCC) [I48.0] Monitoring for long-term anticoagulant use [Z51.81 Z79.01]       Comments:          Allergies  Allergen Reactions  . Latex Rash  . Penicillins Other (See Comments)    Yeast infection / Childhood  . Sulfa Antibiotics Rash  . Tape Other (See Comments)    Plastic, silicone, and paper tape causes bruising and pulls off skin. Cloth tape works fine    Current Outpatient Medications:  .  albuterol (PROVENTIL HFA;VENTOLIN HFA) 108 (90 Base) MCG/ACT inhaler, Inhale 1 puff into the lungs every 6 (six) hours as needed for wheezing or shortness of breath. (Patient taking differently: Inhale 2 puffs into the lungs every 6 (six) hours as needed for wheezing or shortness of breath.), Disp: 6.7 g, Rfl: 2 .  amiodarone (PACERONE) 200 MG tablet, Take 1 tablet (200 mg total) by mouth 2 (two) times daily., Disp: 60 tablet, Rfl: 0 .  amLODipine (NORVASC) 5 MG tablet, Take 5 mg by  mouth at bedtime., Disp: , Rfl:  .  B Complex-C-Zn-Folic Acid (DIALYVITE 371 WITH ZINC) 0.8 MG TABS, Take 1 tablet by mouth daily., Disp: , Rfl:  .  carvedilol (COREG) 25 MG tablet, Take 1.5 tablets (37.5 mg total) by mouth 2 (two) times daily with a meal., Disp: 240 tablet, Rfl: 3 .  diltiazem (CARDIZEM) 60 MG tablet, Take 1 tablet (60 mg total) by mouth 3 (three) times daily as needed (Atrial fibrillation)., Disp: 30 tablet, Rfl: 2 .  DULoxetine (CYMBALTA) 20 MG capsule, Take 2 capsules (40 mg total) by mouth daily., Disp: 180 capsule, Rfl: 2 .  ethyl chloride spray, Apply 1 application topically every Monday, Wednesday, and Friday with hemodialysis., Disp: , Rfl: 12 .  hydrALAZINE (APRESOLINE) 25 MG tablet, Take 1 tablet (25 mg total) by mouth 3 (three) times daily., Disp: 270 tablet, Rfl: 3 .  HYDROcodone-acetaminophen (NORCO/VICODIN) 5-325 MG tablet, Take 1 tablet by mouth every 12 (twelve) hours as needed for moderate pain., Disp: 60 tablet, Rfl: 0 .  isosorbide dinitrate (ISORDIL) 30 MG tablet, Take 1 tablet (30 mg total) by mouth 3 (three) times daily., Disp: 270 tablet, Rfl: 3 .  LINZESS 145 MCG CAPS capsule, TAKE 1 CAPSULE BY MOUTH ONCE DAILY BEFORE  BREAKFAST (Patient taking differently: Take 145 mcg by mouth daily before breakfast.), Disp: 30 capsule, Rfl: 0 .  Menthol, Topical Analgesic, (BIOFREEZE ROLL-ON) 4 % GEL, Apply 1 application topically  daily as needed (pain)., Disp: , Rfl:  .  omeprazole (PRILOSEC) 40 MG capsule, TAKE 1 CAPSULE BY MOUTH TWICE DAILY BEFORE A MEAL (Patient taking differently: Take 40 mg by mouth 2 (two) times daily.), Disp: 60 capsule, Rfl: 0 .  warfarin (COUMADIN) 5 MG tablet, USE AS DIRECTED BY COUMADIN CLINIC (Patient taking differently: Take 2.5-5 mg by mouth See admin instructions. USE AS DIRECTED BY COUMADIN CLINIC Take 2.5mg  on M,W,F and 5mg  all other days), Disp: 113 tablet, Rfl: 5 Past Medical History:  Diagnosis Date  . Acid reflux   . Anemia of chronic  disease   . Arthritis   . Asthma   . Atrial fibrillation ([place])   . Bilateral carotid bruits   . Complication of anesthesia    "hard to wake up, I have sleep apnea" no CPAP  . Depression   . Diverticulitis   . Duodenal ulcer   . Dysrhythmia    Afib  . ESRD (end stage renal disease) ([place])    MWF [place]  . Headache   . History of blood transfusion   . Hypertension   . Malaise and fatigue   . Orthostatic hypotension   . Shortness of breath    " when I walk to fast"  . Sleep apnea   . Syncope   . Tubulovillous adenoma of colon    ASSESSMENT  Recent Results: The most recent result is correlated with 27.5 mg per week:  Lab Results  Component Value Date   INR 2.4 12/04/2020   INR 1.4 (A) 11/29/2020   INR 2.8 (H) 11/24/2020    Anticoagulation Dosing: Description   INR at goal. Continue current weekly dose of 2.5 mg every Mon, Wed, Fri and 5 mg all other days. Recheck INR in 1 week      INR today: Therapeutic. Following recent boost dose and returning to previous weekly dose. Denies any other relevant changes in her diet, medications, or lifestyle. Denies any complains of active bleeding and bruising. Will continue current weekly dose and continue close monitoring.   PLAN Weekly dose was unchanged by 0% to 27.5 mg/week. Continue current weekly dose of 2.5 mg every Mon, Wed, Fri and 5 mg all other days. Recheck INR in 1 weeks.     Patient Instructions  INR at goal. Continue current weekly dose of 2.5 mg every Mon, Wed, Fri and 5 mg all other days. Recheck INR in 1 week  Patient advised to contact clinic or seek medical attention if signs/symptoms of bleeding or thromboembolism occur.  Patient verbalized understanding by repeating back information and was advised to contact me if further medication-related questions arise.   Follow-up Return in about 1 week (around 12/11/2020).  [name], PharmD  15 minutes spent face-to-face with the patient during the  encounter. 50% of time spent on education, including signs/sx bleeding and clotting, as well as food and drug interactions with warfarin. 50% of time was spent on fingerprick POC INR sample collection,processing, results determination, and documentation

## 2020-12-05 DIAGNOSIS — N2581 Secondary hyperparathyroidism of renal origin: Secondary | ICD-10-CM | POA: Diagnosis not present

## 2020-12-05 DIAGNOSIS — Z992 Dependence on renal dialysis: Secondary | ICD-10-CM | POA: Diagnosis not present

## 2020-12-05 DIAGNOSIS — D631 Anemia in chronic kidney disease: Secondary | ICD-10-CM | POA: Diagnosis not present

## 2020-12-05 DIAGNOSIS — N186 End stage renal disease: Secondary | ICD-10-CM | POA: Diagnosis not present

## 2020-12-06 ENCOUNTER — Ambulatory Visit: Payer: Medicare Other | Admitting: Physician Assistant

## 2020-12-06 DIAGNOSIS — M25571 Pain in right ankle and joints of right foot: Secondary | ICD-10-CM | POA: Diagnosis not present

## 2020-12-06 DIAGNOSIS — M722 Plantar fascial fibromatosis: Secondary | ICD-10-CM | POA: Diagnosis not present

## 2020-12-06 DIAGNOSIS — R262 Difficulty in walking, not elsewhere classified: Secondary | ICD-10-CM | POA: Diagnosis not present

## 2020-12-06 DIAGNOSIS — M25572 Pain in left ankle and joints of left foot: Secondary | ICD-10-CM | POA: Diagnosis not present

## 2020-12-06 DIAGNOSIS — R531 Weakness: Secondary | ICD-10-CM | POA: Diagnosis not present

## 2020-12-06 DIAGNOSIS — M76822 Posterior tibial tendinitis, left leg: Secondary | ICD-10-CM | POA: Diagnosis not present

## 2020-12-07 DIAGNOSIS — N2581 Secondary hyperparathyroidism of renal origin: Secondary | ICD-10-CM | POA: Diagnosis not present

## 2020-12-07 DIAGNOSIS — Z992 Dependence on renal dialysis: Secondary | ICD-10-CM | POA: Diagnosis not present

## 2020-12-07 DIAGNOSIS — D631 Anemia in chronic kidney disease: Secondary | ICD-10-CM | POA: Diagnosis not present

## 2020-12-07 DIAGNOSIS — N186 End stage renal disease: Secondary | ICD-10-CM | POA: Diagnosis not present

## 2020-12-10 DIAGNOSIS — D631 Anemia in chronic kidney disease: Secondary | ICD-10-CM | POA: Diagnosis not present

## 2020-12-10 DIAGNOSIS — N186 End stage renal disease: Secondary | ICD-10-CM | POA: Diagnosis not present

## 2020-12-10 DIAGNOSIS — N2581 Secondary hyperparathyroidism of renal origin: Secondary | ICD-10-CM | POA: Diagnosis not present

## 2020-12-10 DIAGNOSIS — Z992 Dependence on renal dialysis: Secondary | ICD-10-CM | POA: Diagnosis not present

## 2020-12-10 NOTE — Progress Notes (Deleted)
Cancelled visit

## 2020-12-11 ENCOUNTER — Other Ambulatory Visit: Payer: Medicare Other

## 2020-12-11 DIAGNOSIS — M76822 Posterior tibial tendinitis, left leg: Secondary | ICD-10-CM | POA: Diagnosis not present

## 2020-12-11 DIAGNOSIS — M25572 Pain in left ankle and joints of left foot: Secondary | ICD-10-CM | POA: Diagnosis not present

## 2020-12-11 DIAGNOSIS — M722 Plantar fascial fibromatosis: Secondary | ICD-10-CM | POA: Diagnosis not present

## 2020-12-11 DIAGNOSIS — R262 Difficulty in walking, not elsewhere classified: Secondary | ICD-10-CM | POA: Diagnosis not present

## 2020-12-11 DIAGNOSIS — R531 Weakness: Secondary | ICD-10-CM | POA: Diagnosis not present

## 2020-12-11 DIAGNOSIS — M25571 Pain in right ankle and joints of right foot: Secondary | ICD-10-CM | POA: Diagnosis not present

## 2020-12-13 ENCOUNTER — Other Ambulatory Visit: Payer: Self-pay

## 2020-12-13 ENCOUNTER — Ambulatory Visit: Payer: Medicare Other | Admitting: Pharmacist

## 2020-12-13 DIAGNOSIS — Z5181 Encounter for therapeutic drug level monitoring: Secondary | ICD-10-CM

## 2020-12-13 DIAGNOSIS — I480 Paroxysmal atrial fibrillation: Secondary | ICD-10-CM | POA: Diagnosis not present

## 2020-12-13 DIAGNOSIS — Z7901 Long term (current) use of anticoagulants: Secondary | ICD-10-CM | POA: Diagnosis not present

## 2020-12-13 LAB — POCT INR: INR: 2 (ref 2.0–3.0)

## 2020-12-13 NOTE — Patient Instructions (Signed)
INR at goal. Continue current weekly dose of 2.5 mg every Mon, Wed, Fri and 5 mg all other days. Recheck INR in 2 week

## 2020-12-13 NOTE — Progress Notes (Signed)
Anticoagulation Management Jane [name] is a 75 y.o. female who reports to the clinic for monitoring of warfarin treatment.    Indication: atrial fibrillation CHA2DS2 Vasc Score 4 (Age >37, female, HTN hx), HAS-BLED 2 (Age>65, renal disease)  Duration: indefinite Supervising physician: Adrian Prows  Anticoagulation Clinic Visit History:  Patient does not report signs/symptoms of bleeding or thromboembolism.  Other recent changes: Pt continues to have persistent chronic pain in her back, knee, and neck. Significantly affecting patient's QOLs and ADLs. Working with physical therapy to help address pain.   EGD + EMR procedure rescheduled for 01/31/21. Pt restarted on warfarin on 11/27/20  Anticoagulation Episode Summary    Current INR goal:  2.0-3.0  TTR:  66.2 % (2.2 y)  Next INR check:  12/27/2020  INR from last check:  2.0 (12/13/2020)  Weekly max warfarin dose:    Target end date:  Indefinite  INR check location:    Preferred lab:    Send INR reminders to:     Indications   Paroxysmal atrial fibrillation (HCC) [I48.0] Monitoring for long-term anticoagulant use [Z51.81 Z79.01]       Comments:          Allergies  Allergen Reactions  . Latex Rash  . Penicillins Other (See Comments)    Yeast infection / Childhood  . Sulfa Antibiotics Rash  . Tape Other (See Comments)    Plastic, silicone, and paper tape causes bruising and pulls off skin. Cloth tape works fine    Current Outpatient Medications:  .  albuterol (PROVENTIL HFA;VENTOLIN HFA) 108 (90 Base) MCG/ACT inhaler, Inhale 1 puff into the lungs every 6 (six) hours as needed for wheezing or shortness of breath. (Patient taking differently: Inhale 2 puffs into the lungs every 6 (six) hours as needed for wheezing or shortness of breath.), Disp: 6.7 g, Rfl: 2 .  amiodarone (PACERONE) 200 MG tablet, Take 1 tablet (200 mg total) by mouth 2 (two) times daily., Disp: 60 tablet, Rfl: 0 .  amLODipine (NORVASC) 5 MG tablet, Take 5 mg by  mouth at bedtime., Disp: , Rfl:  .  B Complex-C-Zn-Folic Acid (DIALYVITE 841 WITH ZINC) 0.8 MG TABS, Take 1 tablet by mouth daily., Disp: , Rfl:  .  carvedilol (COREG) 25 MG tablet, Take 1.5 tablets (37.5 mg total) by mouth 2 (two) times daily with a meal., Disp: 240 tablet, Rfl: 3 .  diltiazem (CARDIZEM) 60 MG tablet, Take 1 tablet (60 mg total) by mouth 3 (three) times daily as needed (Atrial fibrillation)., Disp: 30 tablet, Rfl: 2 .  DULoxetine (CYMBALTA) 20 MG capsule, Take 2 capsules (40 mg total) by mouth daily., Disp: 180 capsule, Rfl: 2 .  ethyl chloride spray, Apply 1 application topically every Monday, Wednesday, and Friday with hemodialysis., Disp: , Rfl: 12 .  hydrALAZINE (APRESOLINE) 25 MG tablet, Take 1 tablet (25 mg total) by mouth 3 (three) times daily. (Patient taking differently: Take 50 mg by mouth 3 (three) times daily.), Disp: 270 tablet, Rfl: 3 .  HYDROcodone-acetaminophen (NORCO/VICODIN) 5-325 MG tablet, Take 1 tablet by mouth every 12 (twelve) hours as needed for moderate pain., Disp: 60 tablet, Rfl: 0 .  isosorbide dinitrate (ISORDIL) 30 MG tablet, Take 1 tablet (30 mg total) by mouth 3 (three) times daily., Disp: 270 tablet, Rfl: 3 .  LINZESS 145 MCG CAPS capsule, TAKE 1 CAPSULE BY MOUTH ONCE DAILY BEFORE  BREAKFAST (Patient taking differently: Take 145 mcg by mouth daily before breakfast.), Disp: 30 capsule, Rfl: 0 .  Menthol, Topical Analgesic, (BIOFREEZE ROLL-ON) 4 % GEL, Apply 1 application topically daily as needed (pain)., Disp: , Rfl:  .  omeprazole (PRILOSEC) 40 MG capsule, TAKE 1 CAPSULE BY MOUTH TWICE DAILY BEFORE A MEAL (Patient taking differently: Take 40 mg by mouth 2 (two) times daily.), Disp: 60 capsule, Rfl: 0 .  warfarin (COUMADIN) 5 MG tablet, USE AS DIRECTED BY COUMADIN CLINIC (Patient taking differently: Take 2.5-5 mg by mouth See admin instructions. USE AS DIRECTED BY COUMADIN CLINIC Take 2.5mg  on M,W,F and 5mg  all other days), Disp: 113 tablet, Rfl: 5 Past  Medical History:  Diagnosis Date  . Acid reflux   . Anemia of chronic disease   . Arthritis   . Asthma   . Atrial fibrillation ([place])   . Bilateral carotid bruits   . Complication of anesthesia    "hard to wake up, I have sleep apnea" no CPAP  . Depression   . Diverticulitis   . Duodenal ulcer   . Dysrhythmia    Afib  . ESRD (end stage renal disease) ([place])    MWF [place]  . Headache   . History of blood transfusion   . Hypertension   . Malaise and fatigue   . Orthostatic hypotension   . Shortness of breath    " when I walk to fast"  . Sleep apnea   . Syncope   . Tubulovillous adenoma of colon    ASSESSMENT  Recent Results: The most recent result is correlated with 27.5 mg per week:  Lab Results  Component Value Date   INR 2.0 12/13/2020   INR 2.4 12/04/2020   INR 1.4 (A) 11/29/2020    Anticoagulation Dosing: Description   INR at goal. Continue current weekly dose of 2.5 mg every Mon, Wed, Fri and 5 mg all other days. Recheck INR in 2 week      INR today: Therapeutic.Continues to remain therapeutic on current dose. Denies any other relevant changes in her diet, medications, or lifestyle. Denies any complains of active bleeding and bruising. Will continue current weekly dose and continue close monitoring.   PLAN Weekly dose was unchanged by 0% to 27.5 mg/week. Continue current weekly dose of 2.5 mg every Mon, Wed, Fri and 5 mg all other days. Recheck INR in 2 weeks.     Patient Instructions  INR at goal. Continue current weekly dose of 2.5 mg every Mon, Wed, Fri and 5 mg all other days. Recheck INR in 2 week  Patient advised to contact clinic or seek medical attention if signs/symptoms of bleeding or thromboembolism occur.  Patient verbalized understanding by repeating back information and was advised to contact me if further medication-related questions arise.   Follow-up Return in about 2 weeks (around 12/27/2020).  [name], PharmD  15 minutes  spent face-to-face with the patient during the encounter. 50% of time spent on education, including signs/sx bleeding and clotting, as well as food and drug interactions with warfarin. 50% of time was spent on fingerprick POC INR sample collection,processing, results determination, and documentation

## 2020-12-14 DIAGNOSIS — Z992 Dependence on renal dialysis: Secondary | ICD-10-CM | POA: Diagnosis not present

## 2020-12-14 DIAGNOSIS — D631 Anemia in chronic kidney disease: Secondary | ICD-10-CM | POA: Diagnosis not present

## 2020-12-14 DIAGNOSIS — N186 End stage renal disease: Secondary | ICD-10-CM | POA: Diagnosis not present

## 2020-12-14 DIAGNOSIS — N2581 Secondary hyperparathyroidism of renal origin: Secondary | ICD-10-CM | POA: Diagnosis not present

## 2020-12-17 DIAGNOSIS — N186 End stage renal disease: Secondary | ICD-10-CM | POA: Diagnosis not present

## 2020-12-17 DIAGNOSIS — D631 Anemia in chronic kidney disease: Secondary | ICD-10-CM | POA: Diagnosis not present

## 2020-12-17 DIAGNOSIS — Z992 Dependence on renal dialysis: Secondary | ICD-10-CM | POA: Diagnosis not present

## 2020-12-17 DIAGNOSIS — N2581 Secondary hyperparathyroidism of renal origin: Secondary | ICD-10-CM | POA: Diagnosis not present

## 2020-12-18 DIAGNOSIS — R262 Difficulty in walking, not elsewhere classified: Secondary | ICD-10-CM | POA: Diagnosis not present

## 2020-12-18 DIAGNOSIS — R531 Weakness: Secondary | ICD-10-CM | POA: Diagnosis not present

## 2020-12-18 DIAGNOSIS — M722 Plantar fascial fibromatosis: Secondary | ICD-10-CM | POA: Diagnosis not present

## 2020-12-18 DIAGNOSIS — M25571 Pain in right ankle and joints of right foot: Secondary | ICD-10-CM | POA: Diagnosis not present

## 2020-12-18 DIAGNOSIS — M25572 Pain in left ankle and joints of left foot: Secondary | ICD-10-CM | POA: Diagnosis not present

## 2020-12-18 DIAGNOSIS — M76822 Posterior tibial tendinitis, left leg: Secondary | ICD-10-CM | POA: Diagnosis not present

## 2020-12-19 ENCOUNTER — Other Ambulatory Visit: Payer: Self-pay | Admitting: Internal Medicine

## 2020-12-19 ENCOUNTER — Other Ambulatory Visit: Payer: Self-pay | Admitting: Family Medicine

## 2020-12-19 ENCOUNTER — Other Ambulatory Visit: Payer: Self-pay | Admitting: Gastroenterology

## 2020-12-19 DIAGNOSIS — D509 Iron deficiency anemia, unspecified: Secondary | ICD-10-CM | POA: Diagnosis not present

## 2020-12-19 DIAGNOSIS — D631 Anemia in chronic kidney disease: Secondary | ICD-10-CM | POA: Diagnosis not present

## 2020-12-19 DIAGNOSIS — Z992 Dependence on renal dialysis: Secondary | ICD-10-CM | POA: Diagnosis not present

## 2020-12-19 DIAGNOSIS — N2581 Secondary hyperparathyroidism of renal origin: Secondary | ICD-10-CM | POA: Diagnosis not present

## 2020-12-19 DIAGNOSIS — I480 Paroxysmal atrial fibrillation: Secondary | ICD-10-CM | POA: Diagnosis not present

## 2020-12-19 DIAGNOSIS — I158 Other secondary hypertension: Secondary | ICD-10-CM | POA: Diagnosis not present

## 2020-12-19 DIAGNOSIS — G894 Chronic pain syndrome: Secondary | ICD-10-CM

## 2020-12-19 DIAGNOSIS — M159 Polyosteoarthritis, unspecified: Secondary | ICD-10-CM

## 2020-12-19 DIAGNOSIS — N186 End stage renal disease: Secondary | ICD-10-CM | POA: Diagnosis not present

## 2020-12-19 DIAGNOSIS — I10 Essential (primary) hypertension: Secondary | ICD-10-CM | POA: Diagnosis not present

## 2020-12-19 NOTE — Telephone Encounter (Signed)
Pt call and need a refill on  HYDROcodone-acetaminophen (NORCO/VICODIN) 5-325 MG tablet sent to  [facility], [place] [address] Phone:  [phone]  Fax:  [phone]

## 2020-12-20 DIAGNOSIS — M76822 Posterior tibial tendinitis, left leg: Secondary | ICD-10-CM | POA: Diagnosis not present

## 2020-12-20 DIAGNOSIS — M722 Plantar fascial fibromatosis: Secondary | ICD-10-CM | POA: Diagnosis not present

## 2020-12-20 DIAGNOSIS — R531 Weakness: Secondary | ICD-10-CM | POA: Diagnosis not present

## 2020-12-20 DIAGNOSIS — R262 Difficulty in walking, not elsewhere classified: Secondary | ICD-10-CM | POA: Diagnosis not present

## 2020-12-20 DIAGNOSIS — M25571 Pain in right ankle and joints of right foot: Secondary | ICD-10-CM | POA: Diagnosis not present

## 2020-12-20 DIAGNOSIS — M25572 Pain in left ankle and joints of left foot: Secondary | ICD-10-CM | POA: Diagnosis not present

## 2020-12-21 DIAGNOSIS — N2581 Secondary hyperparathyroidism of renal origin: Secondary | ICD-10-CM | POA: Diagnosis not present

## 2020-12-21 DIAGNOSIS — Z992 Dependence on renal dialysis: Secondary | ICD-10-CM | POA: Diagnosis not present

## 2020-12-21 DIAGNOSIS — D631 Anemia in chronic kidney disease: Secondary | ICD-10-CM | POA: Diagnosis not present

## 2020-12-21 DIAGNOSIS — D509 Iron deficiency anemia, unspecified: Secondary | ICD-10-CM | POA: Diagnosis not present

## 2020-12-21 DIAGNOSIS — N186 End stage renal disease: Secondary | ICD-10-CM | POA: Diagnosis not present

## 2020-12-24 DIAGNOSIS — D631 Anemia in chronic kidney disease: Secondary | ICD-10-CM | POA: Diagnosis not present

## 2020-12-24 DIAGNOSIS — N186 End stage renal disease: Secondary | ICD-10-CM | POA: Diagnosis not present

## 2020-12-24 DIAGNOSIS — D509 Iron deficiency anemia, unspecified: Secondary | ICD-10-CM | POA: Diagnosis not present

## 2020-12-24 DIAGNOSIS — N2581 Secondary hyperparathyroidism of renal origin: Secondary | ICD-10-CM | POA: Diagnosis not present

## 2020-12-24 DIAGNOSIS — Z992 Dependence on renal dialysis: Secondary | ICD-10-CM | POA: Diagnosis not present

## 2020-12-24 MED ORDER — HYDROCODONE-ACETAMINOPHEN 5-325 MG PO TABS: 1.0000 | ORAL_TABLET | Freq: Two times a day (BID) | ORAL | 0 refills | Status: DC | PRN

## 2020-12-25 DIAGNOSIS — M722 Plantar fascial fibromatosis: Secondary | ICD-10-CM | POA: Diagnosis not present

## 2020-12-25 DIAGNOSIS — R262 Difficulty in walking, not elsewhere classified: Secondary | ICD-10-CM | POA: Diagnosis not present

## 2020-12-25 DIAGNOSIS — M76822 Posterior tibial tendinitis, left leg: Secondary | ICD-10-CM | POA: Diagnosis not present

## 2020-12-25 DIAGNOSIS — M25572 Pain in left ankle and joints of left foot: Secondary | ICD-10-CM | POA: Diagnosis not present

## 2020-12-25 DIAGNOSIS — R531 Weakness: Secondary | ICD-10-CM | POA: Diagnosis not present

## 2020-12-25 DIAGNOSIS — M25571 Pain in right ankle and joints of right foot: Secondary | ICD-10-CM | POA: Diagnosis not present

## 2020-12-26 DIAGNOSIS — D509 Iron deficiency anemia, unspecified: Secondary | ICD-10-CM | POA: Diagnosis not present

## 2020-12-26 DIAGNOSIS — Z992 Dependence on renal dialysis: Secondary | ICD-10-CM | POA: Diagnosis not present

## 2020-12-26 DIAGNOSIS — N186 End stage renal disease: Secondary | ICD-10-CM | POA: Diagnosis not present

## 2020-12-26 DIAGNOSIS — N2581 Secondary hyperparathyroidism of renal origin: Secondary | ICD-10-CM | POA: Diagnosis not present

## 2020-12-26 DIAGNOSIS — D631 Anemia in chronic kidney disease: Secondary | ICD-10-CM | POA: Diagnosis not present

## 2020-12-26 DIAGNOSIS — I4891 Unspecified atrial fibrillation: Secondary | ICD-10-CM | POA: Diagnosis not present

## 2020-12-27 ENCOUNTER — Other Ambulatory Visit: Payer: Self-pay

## 2020-12-27 ENCOUNTER — Ambulatory Visit: Payer: Medicare Other | Admitting: Pharmacist

## 2020-12-27 ENCOUNTER — Ambulatory Visit: Payer: Medicare Other

## 2020-12-27 DIAGNOSIS — L821 Other seborrheic keratosis: Secondary | ICD-10-CM | POA: Diagnosis not present

## 2020-12-27 DIAGNOSIS — I480 Paroxysmal atrial fibrillation: Secondary | ICD-10-CM

## 2020-12-27 DIAGNOSIS — Z7901 Long term (current) use of anticoagulants: Secondary | ICD-10-CM | POA: Diagnosis not present

## 2020-12-27 DIAGNOSIS — L680 Hirsutism: Secondary | ICD-10-CM | POA: Diagnosis not present

## 2020-12-27 DIAGNOSIS — Z5181 Encounter for therapeutic drug level monitoring: Secondary | ICD-10-CM | POA: Diagnosis not present

## 2020-12-27 LAB — POCT INR: INR: 2.8 (ref 2.0–3.0)

## 2020-12-27 NOTE — Progress Notes (Signed)
Anticoagulation Management Jane [name] is a 75 y.o. female who reports to the clinic for monitoring of warfarin treatment.    Indication: atrial fibrillation CHA2DS2 Vasc Score 4 (Age >58, female, HTN hx), HAS-BLED 2 (Age>65, renal disease)  Duration: indefinite Supervising physician: Adrian Prows  Anticoagulation Clinic Visit History:  Patient does not report signs/symptoms of bleeding or thromboembolism.  Other recent changes: Pt continues to have persistent chronic pain in her back, knee, and neck. Significantly affecting patient's QOLs and ADLs. Working with physical therapy to help address pain.   EGD + EMR procedure rescheduled for 01/31/21. Pt continues to have SOB with supine position that has been affecting her sleep quality. Pt w/ PMH of sleep apnea and not currently on CPAP.  No recent sleep evaluation. Pt to contact PCP for further assessment.   Anticoagulation Episode Summary     Current INR goal:  2.0-3.0  TTR:  66.8 % (2.2 y)  Next INR check:  01/10/2021  INR from last check:  2.8 (12/27/2020)  Weekly max warfarin dose:    Target end date:  Indefinite  INR check location:    Preferred lab:    Send INR reminders to:     Indications   Paroxysmal atrial fibrillation (HCC) [I48.0] Monitoring for long-term anticoagulant use [Z51.81 Z79.01]        Comments:           Allergies  Allergen Reactions   Latex Rash   Penicillins Other (See Comments)    Yeast infection / Childhood   Sulfa Antibiotics Rash   Tape Other (See Comments)    Plastic, silicone, and paper tape causes bruising and pulls off skin. Cloth tape works fine    Current Outpatient Medications:    albuterol (PROVENTIL HFA;VENTOLIN HFA) 108 (90 Base) MCG/ACT inhaler, Inhale 1 puff into the lungs every 6 (six) hours as needed for wheezing or shortness of breath. (Patient taking differently: Inhale 2 puffs into the lungs every 6 (six) hours as needed for wheezing or shortness of breath.), Disp: 6.7 g, Rfl:  2   amiodarone (PACERONE) 200 MG tablet, Take 1 tablet (200 mg total) by mouth 2 (two) times daily., Disp: 60 tablet, Rfl: 0   amLODipine (NORVASC) 5 MG tablet, Take 5 mg by mouth at bedtime., Disp: , Rfl:    B Complex-C-Zn-Folic Acid (DIALYVITE 607 WITH ZINC) 0.8 MG TABS, Take 1 tablet by mouth daily., Disp: , Rfl:    carvedilol (COREG) 25 MG tablet, Take 1.5 tablets (37.5 mg total) by mouth 2 (two) times daily with a meal., Disp: 240 tablet, Rfl: 3   diltiazem (CARDIZEM) 60 MG tablet, Take 1 tablet (60 mg total) by mouth 3 (three) times daily as needed (Atrial fibrillation)., Disp: 30 tablet, Rfl: 2   DULoxetine (CYMBALTA) 20 MG capsule, Take 2 capsules (40 mg total) by mouth daily., Disp: 180 capsule, Rfl: 2   ethyl chloride spray, Apply 1 application topically every Monday, Wednesday, and Friday with hemodialysis., Disp: , Rfl: 12   hydrALAZINE (APRESOLINE) 25 MG tablet, Take 1 tablet (25 mg total) by mouth 3 (three) times daily. (Patient taking differently: Take 50 mg by mouth 3 (three) times daily.), Disp: 270 tablet, Rfl: 3   HYDROcodone-acetaminophen (NORCO/VICODIN) 5-325 MG tablet, Take 1 tablet by mouth every 12 (twelve) hours as needed for moderate pain., Disp: 60 tablet, Rfl: 0   isosorbide dinitrate (ISORDIL) 30 MG tablet, Take 1 tablet (30 mg total) by mouth 3 (three) times daily., Disp: 270 tablet, Rfl:  3   LINZESS 145 MCG CAPS capsule, TAKE 1 CAPSULE BY MOUTH ONCE DAILY BEFORE BREAKFAST, Disp: 30 capsule, Rfl: 0   Menthol, Topical Analgesic, (BIOFREEZE ROLL-ON) 4 % GEL, Apply 1 application topically daily as needed (pain)., Disp: , Rfl:    omeprazole (PRILOSEC) 40 MG capsule, TAKE 1 CAPSULE BY MOUTH TWICE DAILY BEFORE A MEAL, Disp: 60 capsule, Rfl: 0   warfarin (COUMADIN) 5 MG tablet, USE AS DIRECTED BY COUMADIN CLINIC (Patient taking differently: Take 2.5-5 mg by mouth See admin instructions. USE AS DIRECTED BY COUMADIN CLINIC Take 2.5mg  on M,W,F and 5mg  all other days), Disp: 113  tablet, Rfl: 5 Past Medical History:  Diagnosis Date   Acid reflux    Anemia of chronic disease    Arthritis    Asthma    Atrial fibrillation (HCC)    Bilateral carotid bruits    Complication of anesthesia    "hard to wake up, I have sleep apnea" no CPAP   Depression    Diverticulitis    Duodenal ulcer    Dysrhythmia    Afib   ESRD (end stage renal disease) ([place])    MWF [place]   Headache    History of blood transfusion    Hypertension    Malaise and fatigue    Orthostatic hypotension    Shortness of breath    " when I walk to fast"   Sleep apnea    Syncope    Tubulovillous adenoma of colon    ASSESSMENT  Recent Results: The most recent result is correlated with 27.5 mg per week:  Lab Results  Component Value Date   INR 2.8 12/27/2020   INR 2.0 12/13/2020   INR 2.4 12/04/2020    Anticoagulation Dosing: Description   INR at goal. Continue current weekly dose of 2.5 mg every Mon, Wed, Fri and 5 mg all other days. Recheck INR in 2 week      INR today: Therapeutic.Continues to remain therapeutic on current dose. Denies any other relevant changes in her diet, medications, or lifestyle. Denies any complains of active bleeding and bruising. Will continue current weekly dose and continue close monitoring.   PLAN Weekly dose was unchanged by 0% to 27.5 mg/week. Continue current weekly dose of 2.5 mg every Mon, Wed, Fri and 5 mg all other days. Recheck INR in 2 weeks.     Patient Instructions  INR at goal. Continue current weekly dose of 2.5 mg every Mon, Wed, Fri and 5 mg all other days. Recheck INR in 2 week Patient advised to contact clinic or seek medical attention if signs/symptoms of bleeding or thromboembolism occur.  Patient verbalized understanding by repeating back information and was advised to contact me if further medication-related questions arise.   Follow-up Return in about 2 weeks (around 01/10/2021).  [name], PharmD  15 minutes  spent face-to-face with the patient during the encounter. 50% of time spent on education, including signs/sx bleeding and clotting, as well as food and drug interactions with warfarin. 50% of time was spent on fingerprick POC INR sample collection,processing, results determination, and documentation

## 2020-12-27 NOTE — Patient Instructions (Signed)
INR at goal. Continue current weekly dose of 2.5 mg every Mon, Wed, Fri and 5 mg all other days. Recheck INR in 2 week

## 2020-12-28 DIAGNOSIS — D631 Anemia in chronic kidney disease: Secondary | ICD-10-CM | POA: Diagnosis not present

## 2020-12-28 DIAGNOSIS — N2581 Secondary hyperparathyroidism of renal origin: Secondary | ICD-10-CM | POA: Diagnosis not present

## 2020-12-28 DIAGNOSIS — N186 End stage renal disease: Secondary | ICD-10-CM | POA: Diagnosis not present

## 2020-12-28 DIAGNOSIS — Z992 Dependence on renal dialysis: Secondary | ICD-10-CM | POA: Diagnosis not present

## 2020-12-28 DIAGNOSIS — D509 Iron deficiency anemia, unspecified: Secondary | ICD-10-CM | POA: Diagnosis not present

## 2020-12-30 NOTE — Progress Notes (Signed)
Echocardiogram 12/27/2020: Normal LV systolic function with visual EF 60-65%. Left ventricle cavity is normal in size. Moderate left ventricular hypertrophy. Normal global wall motion. Indeterminate diastolic filling pattern, elevated LAP. No significant valvular heart disease. Compared to study dated 11/24/2017: LVEF is preserved, G2DD is now indeterminate with elevated LAP, MR and TR have improved, otherwise no significant change.

## 2020-12-31 DIAGNOSIS — N2581 Secondary hyperparathyroidism of renal origin: Secondary | ICD-10-CM | POA: Diagnosis not present

## 2020-12-31 DIAGNOSIS — D631 Anemia in chronic kidney disease: Secondary | ICD-10-CM | POA: Diagnosis not present

## 2020-12-31 DIAGNOSIS — N186 End stage renal disease: Secondary | ICD-10-CM | POA: Diagnosis not present

## 2020-12-31 DIAGNOSIS — Z992 Dependence on renal dialysis: Secondary | ICD-10-CM | POA: Diagnosis not present

## 2020-12-31 DIAGNOSIS — D509 Iron deficiency anemia, unspecified: Secondary | ICD-10-CM | POA: Diagnosis not present

## 2021-01-01 ENCOUNTER — Encounter: Payer: Self-pay | Admitting: Podiatry

## 2021-01-01 ENCOUNTER — Ambulatory Visit (INDEPENDENT_AMBULATORY_CARE_PROVIDER_SITE_OTHER): Payer: Medicare Other | Admitting: Podiatry

## 2021-01-01 ENCOUNTER — Other Ambulatory Visit: Payer: Self-pay

## 2021-01-01 DIAGNOSIS — M76822 Posterior tibial tendinitis, left leg: Secondary | ICD-10-CM

## 2021-01-01 NOTE — Patient Instructions (Signed)
Look for Voltaren gel at the pharmacy over the counter or online (also known as diclofenac 1% gel). Apply to the painful areas 3-4x daily with the supplied dosing card. Allow to dry for 10 minutes before going into socks/shoes   Look for an "EvenUp" shoe attachment on [employer] or at [employer]. This will level out your hips while you are walking in the CAM boot. Wear this on the other foot around a supportive sneaker:    

## 2021-01-01 NOTE — Progress Notes (Signed)
  Subjective:  [name], female    DOB: September 02, 1945,  MRN: [id number]  Chief Complaint  Patient presents with   Plantar Fasciitis    6 week follow up right foot    75 y.o. female presents with the above complaint. History confirmed with patient.  The right side where she had the injection is now pain-free.  The left side is even more painful.  She did stop physical therapy because of the pain.  Objective:  Physical Exam: warm, good capillary refill, no trophic changes or ulcerative lesions, normal DP and PT pulses and normal sensory exam. Left Foot: Pain on palpation at the insertion of the posterior tibial tendon on the navicular and with resisted inversion Right Foot: No pain    Radiographs: X-ray of both feet reviewed: no fracture, dislocation, swelling or degenerative changes noted Assessment:   1. Posterior tibial tendinitis of left lower extremity      Plan:  Patient was evaluated and treated and all questions answered.  Has worsened since last visit.  I recommend she stop physical therapy.  Upgrade to CAM boot immobilization on the left side.  She was wearing the Tri-Lock ankle brace incorrectly but I think she still should be in the CAM boot for now.  Plan to do this for 4 weeks if improving will restart physical therapy and go back to Tri-Lock brace correctly.  If worsens consider MRI.   Return in about 4 weeks (around 01/29/2021) for re-check L posterior tibial tendinitis.

## 2021-01-02 ENCOUNTER — Ambulatory Visit (INDEPENDENT_AMBULATORY_CARE_PROVIDER_SITE_OTHER): Payer: Medicare Other | Admitting: Family Medicine

## 2021-01-02 ENCOUNTER — Encounter: Payer: Self-pay | Admitting: Family Medicine

## 2021-01-02 VITALS — BP 142/64 | HR 71 | Resp 20 | Ht 64.0 in

## 2021-01-02 DIAGNOSIS — N186 End stage renal disease: Secondary | ICD-10-CM | POA: Diagnosis not present

## 2021-01-02 DIAGNOSIS — R600 Localized edema: Secondary | ICD-10-CM | POA: Diagnosis not present

## 2021-01-02 DIAGNOSIS — M159 Polyosteoarthritis, unspecified: Secondary | ICD-10-CM

## 2021-01-02 DIAGNOSIS — D631 Anemia in chronic kidney disease: Secondary | ICD-10-CM | POA: Diagnosis not present

## 2021-01-02 DIAGNOSIS — Z992 Dependence on renal dialysis: Secondary | ICD-10-CM

## 2021-01-02 DIAGNOSIS — G894 Chronic pain syndrome: Secondary | ICD-10-CM

## 2021-01-02 DIAGNOSIS — I6523 Occlusion and stenosis of bilateral carotid arteries: Secondary | ICD-10-CM

## 2021-01-02 DIAGNOSIS — N2581 Secondary hyperparathyroidism of renal origin: Secondary | ICD-10-CM | POA: Diagnosis not present

## 2021-01-02 DIAGNOSIS — I129 Hypertensive chronic kidney disease with stage 1 through stage 4 chronic kidney disease, or unspecified chronic kidney disease: Secondary | ICD-10-CM

## 2021-01-02 DIAGNOSIS — G4733 Obstructive sleep apnea (adult) (pediatric): Secondary | ICD-10-CM

## 2021-01-02 DIAGNOSIS — I480 Paroxysmal atrial fibrillation: Secondary | ICD-10-CM

## 2021-01-02 DIAGNOSIS — D509 Iron deficiency anemia, unspecified: Secondary | ICD-10-CM | POA: Diagnosis not present

## 2021-01-02 NOTE — Progress Notes (Signed)
Chief Complaint  Patient presents with   High BP- has a DX of HTN    Pt says her Bps have been having some issues breathing with reclining. Her BP has been all over the place as well. She c/o edema in her legs. This all started right around mothers day. Pt says she has some chest pain at night.    HPI: [name] is a 75 y.o. female, who is here today with her daughter complaining of elevated BP readings and difficulty breathing when lying down in bed since ER visit. Evaluated in the ER on 11/26/2020 because of worsening atrial fibrillation and CP, symptoms improved after a dose of diltiazem. Amiodarone was increased from 200 mg daily to 200 mg twice daily and diltiazem 60 mg 3 times daily as needed was started.  She has not needed dose of diltiazem. She is also on Coumadin 2.5 mg M, W, F and 5 mg the rest of the days.  She is following with Coumadin clinic.  Currently she is on hydralazine 50 mg 3 times daily, recently increased by her cardiologist.  Carvedilol 25 mg 1-1/2 tablet twice daily. She has not been taking amlodipine 5 mg, thought to be worsening lower extremity edema.  Negative for CP, palpitation, focal neurologic deficit, decreased urine output (still making urine), gross hematuria, or unusual/severe headache. BP readings at home 180/80's.  ESRD: She had dialysis today. She has labs done regularly. I do not have copy of recent labs, noted K+ 2.9 on 11/26/2020. She is not on potassium supplementation.  According to patient, he was addressed by nephrologist PA this past Monday.  Last time she had labs done was today. She has an appointment with her nephrologist this coming Friday.  She is concerned about episodes of dyspnea when she is in bed. Waking up a few times with difficulty breathing, PND? She is sleeping on 2 pillows, which is her baseline. She has not noted heartburn. Exertional dyspnea for a while, stable.  Echo on 12/29/2020 showed LVEF 60 to 65%, not able to  determine diastolic function. According to patient, her cardiologist recommended to have sleep study done.  She has hx of OSA, did not tolerate CPAP. + Loud snoring. + Fatigue.  Chronic pain: Left shoulder, hands, lower back, knees,ankles, and feet. Following with podiatrist, received steroid injection in right foot and currently wearing a surgical boot; these have helped with pain. Hydrocodone-Acetaminophen does not help. According to pt, her nephrologist recommended Ibuprofen,which she took a couple times. Currently she is on duloxetine 40 mg daily, which she feels has helped with generalized arthralgias  She would like a referral to pain management.  Review of Systems  Constitutional:  Positive for fatigue. Negative for activity change, appetite change and fever.  HENT:  Negative for mouth sores, nosebleeds and sore throat.   Eyes:  Negative for redness and visual disturbance.  Respiratory:  Negative for cough and wheezing.   Gastrointestinal:  Negative for abdominal pain, nausea and vomiting.       Negative for changes in bowel habits.  Genitourinary:  Negative for dysuria and frequency.  Musculoskeletal:  Positive for arthralgias, back pain and gait problem.  Skin:  Negative for rash.  Neurological:  Negative for syncope and facial asymmetry.  Psychiatric/Behavioral:  Negative for confusion. The patient is nervous/anxious.   Rest see pertinent positives and negatives per HPI.  Current Outpatient Medications on File Prior to Visit  Medication Sig Dispense Refill   albuterol (PROVENTIL HFA;VENTOLIN  HFA) 108 (90 Base) MCG/ACT inhaler Inhale 1 puff into the lungs every 6 (six) hours as needed for wheezing or shortness of breath. (Patient taking differently: Inhale 2 puffs into the lungs every 6 (six) hours as needed for wheezing or shortness of breath.) 6.7 g 2   amLODipine (NORVASC) 5 MG tablet Take 5 mg by mouth at bedtime.     B Complex-C-Zn-Folic Acid (DIALYVITE 938 WITH ZINC)  0.8 MG TABS Take 1 tablet by mouth daily.     carvedilol (COREG) 25 MG tablet Take 1.5 tablets (37.5 mg total) by mouth 2 (two) times daily with a meal. 240 tablet 3   diltiazem (CARDIZEM) 60 MG tablet Take 1 tablet (60 mg total) by mouth 3 (three) times daily as needed (Atrial fibrillation). 30 tablet 2   DULoxetine (CYMBALTA) 20 MG capsule Take 2 capsules (40 mg total) by mouth daily. 180 capsule 2   ethyl chloride spray Apply 1 application topically every Monday, Wednesday, and Friday with hemodialysis.  12   hydrALAZINE (APRESOLINE) 25 MG tablet Take 1 tablet (25 mg total) by mouth 3 (three) times daily. (Patient taking differently: Take 50 mg by mouth 3 (three) times daily.) 270 tablet 3   isosorbide dinitrate (ISORDIL) 30 MG tablet Take 1 tablet (30 mg total) by mouth 3 (three) times daily. 270 tablet 3   LINZESS 145 MCG CAPS capsule TAKE 1 CAPSULE BY MOUTH ONCE DAILY BEFORE BREAKFAST 30 capsule 0   Menthol, Topical Analgesic, (BIOFREEZE ROLL-ON) 4 % GEL Apply 1 application topically daily as needed (pain).     omeprazole (PRILOSEC) 40 MG capsule TAKE 1 CAPSULE BY MOUTH TWICE DAILY BEFORE A MEAL 60 capsule 0   warfarin (COUMADIN) 5 MG tablet USE AS DIRECTED BY COUMADIN CLINIC (Patient taking differently: Take 2.5-5 mg by mouth See admin instructions. USE AS DIRECTED BY COUMADIN CLINIC Take 2.5mg  on M,W,F and 5mg  all other days) 113 tablet 5   amiodarone (PACERONE) 200 MG tablet Take 1 tablet (200 mg total) by mouth 2 (two) times daily. 60 tablet 0   No current facility-administered medications on file prior to visit.     Past Medical History:  Diagnosis Date   Acid reflux    Anemia of chronic disease    Arthritis    Asthma    Atrial fibrillation ([place])    Bilateral carotid bruits    Complication of anesthesia    "hard to wake up, I have sleep apnea" no CPAP   Depression    Diverticulitis    Duodenal ulcer    Dysrhythmia    Afib   ESRD (end stage renal disease) ([place])    MWF  [place]   Headache    History of blood transfusion    Hypertension    Malaise and fatigue    Orthostatic hypotension    Shortness of breath    " when I walk to fast"   Sleep apnea    Syncope    Tubulovillous adenoma of colon    Allergies  Allergen Reactions   Latex Rash   Penicillins Other (See Comments)    Yeast infection / Childhood   Sulfa Antibiotics Rash   Tape Other (See Comments)    Plastic, silicone, and paper tape causes bruising and pulls off skin. Cloth tape works fine    Social History   Socioeconomic History   Marital status: Divorced    Spouse name: Not on file   Number of children: 4   Years of education:  14   Highest education level: Associate degree: occupational, Hotel manager, or vocational program  Occupational History   Occupation: Retired  Tobacco Use   Smoking status: Never   Smokeless tobacco: Never  Vaping Use   Vaping Use: Never used  Substance and Sexual Activity   Alcohol use: No   Drug use: No   Sexual activity: Never  Other Topics Concern   Not on file  Social History Narrative   HH 1   Divorced   Outpatient dialysis Mon, Wed, Fri   4 children: 1 daughter locally is an Designer, multimedia and 3 sons in [place] Determinants of Health   Financial Resource Strain: Low Risk    Difficulty of Paying Living Expenses: Not hard at all  Food Insecurity: No Food Insecurity   Worried About Charity fundraiser in the Last Year: Never true   Arboriculturist in the Last Year: Never true  Transportation Needs: No Transportation Needs   Lack of Transportation (Medical): No   Lack of Transportation (Non-Medical): No  Physical Activity: Inactive   Days of Exercise per Week: 0 days   Minutes of Exercise per Session: 0 min  Stress: Stress Concern Present   Feeling of Stress : To some extent  Social Connections: Moderately Isolated   Frequency of Communication with Friends and Family: More than three times a week   Frequency of Social  Gatherings with Friends and Family: Never   Attends Religious Services: More than 4 times per year   Active Member of Clubs or Organizations: No   Attends Archivist Meetings: Never   Marital Status: Divorced    Vitals:   01/02/21 1530  BP: (!) 142/64  Pulse: 71  Resp: 20  SpO2: 90%   Body mass index is 37.25 kg/m.  Physical Exam Vitals and nursing note reviewed.  Constitutional:      General: She is not in acute distress.    Appearance: She is well-developed.  HENT:     Head: Normocephalic and atraumatic.     Mouth/Throat:     Mouth: Mucous membranes are moist.  Eyes:     Conjunctiva/sclera: Conjunctivae normal.  Cardiovascular:     Rate and Rhythm: Normal rate and regular rhythm.     Pulses:          Dorsalis pedis pulses are 2+ on the right side.     Heart sounds: Murmur (SEM II/VI RUSB and LUSB) heard.     Comments: Trace pitting LE edema, bilateral. Left surgical boot. Pulmonary:     Effort: Pulmonary effort is normal. No respiratory distress.     Breath sounds: Normal breath sounds.  Abdominal:     Palpations: Abdomen is soft.     Tenderness: There is no abdominal tenderness.  Musculoskeletal:     Lumbar back: Tenderness present. No bony tenderness.       Back:  Lymphadenopathy:     Cervical: No cervical adenopathy.  Skin:    General: Skin is warm.     Findings: No erythema or rash.  Neurological:     General: No focal deficit present.     Mental Status: She is alert and oriented to person, place, and time.     Comments: Antalgic gait assisted by a cane.  Psychiatric:        Mood and Affect: Mood is anxious.     Comments: Well groomed, good eye contact.   ASSESSMENT AND PLAN:  Ms.Jacobi was seen  today for high bp- has a dx of htn.  Diagnoses and all orders for this visit: Orders Placed This Encounter  Procedures   Ambulatory referral to Pulmonology   Ambulatory referral to Pain Clinic   OSA (obstructive sleep apnea) She has not  tolerated CPAP in the past. We discussed possible complications. Appointment with a sleep specialist will be arranged, pulmonologist.  Generalized osteoarthritis of multiple sites Duloxetine 40 mg seems to be helping, so no changes. We discussed the risk of interaction with some of her medication. Hydrocodone-acetaminophen is not helping, so discontinued. Given her history of heart disease and on chronic anticoagulation, I do not recommend NSAIDs.  Paroxysmal atrial fibrillation (HCC) Rhythm and rate controlled today. Continue Coumadin same dose, diltiazem 60 mg 3 times daily as needed, and carvedilol 25 mg 1-1/2 tablet twice daily. She has an appointment with her cardiologist next week.  Hypertension with renal disease BP rechecked: 180/65. We discussed possible complications of elevated BP. Continue low-salt diet. Recommend resuming amlodipine 5 mg at bedtime, we discussed some side effects. Continue carvedilol and hydralazine same dose. She has an appointment with cardiology next week. Instructed about warning signs.  Chronic pain disorder Fall precautions discussed. Appointment with pain clinic will be arranged.  Bilateral lower extremity edema It is not significant today. We discussed possible etiologies. She has compression stockings at home, recommend wearing them. LE elevation above heart level a few times throughout the day.  ESRD on hemodialysis [facility]) She has an appointment with her nephrologist this coming Friday.  Spent 45 minutes.  During this time history was obtained and documented, examination was performed, prior labs/imaging reviewed, and assessment/plan discussed.  Return in about 4 months (around 05/04/2021).   Jane [name] G. [name], MD  [facility]. [place] office.   A few things to remember from today's visit:   Sleep apnea, unspecified type  If you need refills please call your pharmacy. Do not use My Chart to request refills or for  acute issues that need immediate attention.   Today we resume Amlodipine 5 mg. Continue monitoring blood pressure at home.Keep appt with cardiologist. Stop Hydrocodone-Acetaminophen. Continue Duloxetine.  Appt for pain management and with pulmonologist will be arranged.   Please be sure medication list is accurate. If a new problem present, please set up appointment sooner than planned today.

## 2021-01-02 NOTE — Patient Instructions (Addendum)
A few things to remember from today's visit:   Sleep apnea, unspecified type  If you need refills please call your pharmacy. Do not use My Chart to request refills or for acute issues that need immediate attention.   Today we resume Amlodipine 5 mg. Continue monitoring blood pressure at home.Keep appt with cardiologist. Stop Hydrocodone-Acetaminophen. Continue Duloxetine.  Appt for pain management and with pulmonologist will be arranged.   Please be sure medication list is accurate. If a new problem present, please set up appointment sooner than planned today.

## 2021-01-04 DIAGNOSIS — N2581 Secondary hyperparathyroidism of renal origin: Secondary | ICD-10-CM | POA: Diagnosis not present

## 2021-01-04 DIAGNOSIS — Z992 Dependence on renal dialysis: Secondary | ICD-10-CM | POA: Diagnosis not present

## 2021-01-04 DIAGNOSIS — N186 End stage renal disease: Secondary | ICD-10-CM | POA: Diagnosis not present

## 2021-01-04 DIAGNOSIS — D631 Anemia in chronic kidney disease: Secondary | ICD-10-CM | POA: Diagnosis not present

## 2021-01-04 DIAGNOSIS — D509 Iron deficiency anemia, unspecified: Secondary | ICD-10-CM | POA: Diagnosis not present

## 2021-01-07 DIAGNOSIS — Z992 Dependence on renal dialysis: Secondary | ICD-10-CM | POA: Diagnosis not present

## 2021-01-07 DIAGNOSIS — D631 Anemia in chronic kidney disease: Secondary | ICD-10-CM | POA: Diagnosis not present

## 2021-01-07 DIAGNOSIS — D509 Iron deficiency anemia, unspecified: Secondary | ICD-10-CM | POA: Diagnosis not present

## 2021-01-07 DIAGNOSIS — N2581 Secondary hyperparathyroidism of renal origin: Secondary | ICD-10-CM | POA: Diagnosis not present

## 2021-01-07 DIAGNOSIS — N186 End stage renal disease: Secondary | ICD-10-CM | POA: Diagnosis not present

## 2021-01-09 DIAGNOSIS — N186 End stage renal disease: Secondary | ICD-10-CM | POA: Diagnosis not present

## 2021-01-09 DIAGNOSIS — N2581 Secondary hyperparathyroidism of renal origin: Secondary | ICD-10-CM | POA: Diagnosis not present

## 2021-01-09 DIAGNOSIS — D631 Anemia in chronic kidney disease: Secondary | ICD-10-CM | POA: Diagnosis not present

## 2021-01-09 DIAGNOSIS — Z992 Dependence on renal dialysis: Secondary | ICD-10-CM | POA: Diagnosis not present

## 2021-01-09 DIAGNOSIS — D509 Iron deficiency anemia, unspecified: Secondary | ICD-10-CM | POA: Diagnosis not present

## 2021-01-10 ENCOUNTER — Encounter: Payer: Self-pay | Admitting: Cardiology

## 2021-01-10 ENCOUNTER — Other Ambulatory Visit: Payer: Self-pay

## 2021-01-10 ENCOUNTER — Telehealth: Payer: Self-pay | Admitting: Gastroenterology

## 2021-01-10 ENCOUNTER — Ambulatory Visit: Payer: Medicare Other | Admitting: Cardiology

## 2021-01-10 ENCOUNTER — Telehealth: Payer: Self-pay

## 2021-01-10 VITALS — BP 162/73 | HR 71 | Temp 97.8°F | Resp 16 | Ht 64.0 in | Wt 211.0 lb

## 2021-01-10 DIAGNOSIS — I6523 Occlusion and stenosis of bilateral carotid arteries: Secondary | ICD-10-CM | POA: Diagnosis not present

## 2021-01-10 DIAGNOSIS — Z5181 Encounter for therapeutic drug level monitoring: Secondary | ICD-10-CM | POA: Diagnosis not present

## 2021-01-10 DIAGNOSIS — I480 Paroxysmal atrial fibrillation: Secondary | ICD-10-CM

## 2021-01-10 DIAGNOSIS — Z7901 Long term (current) use of anticoagulants: Secondary | ICD-10-CM

## 2021-01-10 DIAGNOSIS — I10 Essential (primary) hypertension: Secondary | ICD-10-CM

## 2021-01-10 LAB — POCT INR: INR: 2.6 (ref 2.0–3.0)

## 2021-01-10 MED ORDER — AMIODARONE HCL 200 MG PO TABS: 200.0000 mg | ORAL_TABLET | Freq: Every day | ORAL | 1 refills | Status: DC

## 2021-01-10 MED ORDER — AMLODIPINE BESYLATE 10 MG PO TABS: 10.0000 mg | ORAL_TABLET | Freq: Every day | ORAL | 3 refills | Status: DC

## 2021-01-10 NOTE — Telephone Encounter (Signed)
   Jane [name] 05/14/1946 [id number]  Dear Dr. Einar Gip   We have scheduled the above named patient for a(n) EGD procedure. Our records show that (s)he is on anticoagulation therapy.  Please advise as to whether the patient may come off their therapy of Coumadin 5 days prior to their procedure which is scheduled for 01/31/21.  Please route your response to [employer] or fax response to [phone]  Sincerely,    [facility]

## 2021-01-10 NOTE — Patient Instructions (Signed)
INR at goal. Continue current weekly dose of 2.5 mg every Mon, Wed, Fri and 5 mg all other days. Recheck INR in 2 week

## 2021-01-10 NOTE — Progress Notes (Signed)
Primary Physician/Referring:  [name], [name], MD  [name], female    DOB: 09/21/1945, 75 y.o.   MRN: [id number]  Chief Complaint  Patient presents with   Hypertension   Atrial Fibrillation   Follow-up    4 weeks   HPI:    Jane [name]  is a 75 y.o. female with paroxysmal atrial fibrillation, on low-dose amiodarone, dose reduced due to prolonged QT and eventually discontinued, end-stage disease on hemodialysis on Monday Wednesday and Friday, orthostatic hypotension, obstructive sleep apnea not on CPAP, external hemorrhoids but tolerating Coumadin. She presented with chest pain and A. fib with RVR to the emergency room on 11/26/2020 and received 1 dose of diltiazem with improvement in heart rate and was recommended to start amiodarone at 200 mg twice daily.    She is tolerating amiodarone and has had 2 brief episodes of atrial fibrillation for which she took additional diltiazem CD.  Otherwise remains asymptomatic.  She is tolerating Coumadin and is being followed in our Coumadin clinic.  She denies any hemorrhoidal bleed, dark stools or bloody stools.  Past Medical History:  Diagnosis Date   Acid reflux    Anemia of chronic disease    Arthritis    Asthma    Atrial fibrillation ([place])    Bilateral carotid bruits    Complication of anesthesia    "hard to wake up, I have sleep apnea" no CPAP   Depression    Diverticulitis    Duodenal ulcer    Dysrhythmia    Afib   ESRD (end stage renal disease) ([place])    MWF [place]   Headache    History of blood transfusion    Hypertension    Malaise and fatigue    Orthostatic hypotension    Shortness of breath    " when I walk to fast"   Sleep apnea    Syncope    Tubulovillous adenoma of colon    Past Surgical History:  Procedure Laterality Date   A/V FISTULAGRAM Right 10/18/2020   Procedure: A/V FISTULAGRAM;  Surgeon: Marty Heck, MD;  Location: [facility];  Service: Cardiovascular;  Laterality:  Right;   AV FISTULA PLACEMENT     BACK SURGERY     Lumbar fusion L 4 and L 5   BIOPSY  01/09/2020   Procedure: BIOPSY;  Surgeon: Rush Landmark Telford Nab., MD;  Location: [facility];  Service: Gastroenterology;;   COLONOSCOPY     ENDOSCOPIC MUCOSAL RESECTION N/A 01/09/2020   Procedure: ENDOSCOPIC MUCOSAL RESECTION;  Surgeon: Irving Copas., MD;  Location: [place];  Service: Gastroenterology;  Laterality: N/A;   ESOPHAGOGASTRODUODENOSCOPY     ESOPHAGOGASTRODUODENOSCOPY (EGD) WITH PROPOFOL N/A 01/09/2020   Procedure: ESOPHAGOGASTRODUODENOSCOPY (EGD) WITH PROPOFOL;  Surgeon: Rush Landmark Telford Nab., MD;  Location: [place];  Service: Gastroenterology;  Laterality: N/A;   ESOPHAGOGASTRODUODENOSCOPY (EGD) WITH PROPOFOL N/A 01/13/2020   Procedure: ESOPHAGOGASTRODUODENOSCOPY (EGD) WITH PROPOFOL;  Surgeon: Irene Shipper, MD;  Location: [place];  Service: Gastroenterology;  Laterality: N/A;   EUS N/A 01/09/2020   Procedure: UPPER ENDOSCOPIC ULTRASOUND (EUS) RADIAL;  Surgeon: Irving Copas., MD;  Location: [facility];  Service: Gastroenterology;  Laterality: N/A;   HEMOSTASIS CLIP PLACEMENT  01/09/2020   Procedure: HEMOSTASIS CLIP PLACEMENT;  Surgeon: Irving Copas., MD;  Location: [place];  Service: Gastroenterology;;   HEMOSTASIS CONTROL  01/13/2020   Procedure: HEMOSTASIS CONTROL;  Surgeon: Irene Shipper, MD;  Location: [facility] ENDOSCOPY;  Service: Gastroenterology;;  hemaspray   HOT HEMOSTASIS N/A 01/13/2020   Procedure: HOT HEMOSTASIS (ARGON PLASMA COAGULATION/BICAP);  Surgeon: Irene Shipper, MD;  Location: [place];  Service: Gastroenterology;  Laterality: N/A;   IR GENERIC HISTORICAL  07/09/2016   IR US GUIDE VASC ACCESS RIGHT 07/09/2016 Jane Cleveland, MD [facility]   IR GENERIC HISTORICAL  07/09/2016   IR FLUORO GUIDE CV LINE RIGHT 07/09/2016 Jane Cleveland, MD [facility]   KNEE ARTHROPLASTY Left    LAPAROSCOPIC SIGMOID COLECTOMY N/A 07/11/2016    Procedure: LAPAROSCOPIC SIGMOID COLECTOMY;  Surgeon: Jane Riley, MD;  Location: [place];  Service: General;  Laterality: N/A;   MASS EXCISION Right 04/10/2020   Procedure: EXCISION SKIN NODULE RIGHT FOREARM;  Surgeon: Jane Sandy, MD;  Location: [place];  Service: Vascular;  Laterality: Right;   SCLEROTHERAPY  01/09/2020   Procedure: Clide Deutscher;  Surgeon: Mansouraty, Telford Nab., MD;  Location: [place];  Service: Gastroenterology;;   Clide Deutscher  01/13/2020   Procedure: Clide Deutscher;  Surgeon: Irene Shipper, MD;  Location: [facility]COPY;  Service: Gastroenterology;;   SUBMUCOSAL LIFTING INJECTION  01/09/2020   Procedure: SUBMUCOSAL LIFTING INJECTION;  Surgeon: Irving Copas., MD;  Location: [facility]COPY;  Service: Gastroenterology;;   TUBAL LIGATION     Family History  Problem Relation Age of Onset   Heart failure Mother    Stroke Mother    Other Father    Colon cancer Neg Hx    Liver disease Neg Hx    Esophageal cancer Neg Hx    Stomach cancer Neg Hx    Inflammatory bowel disease Neg Hx    Rectal cancer Neg Hx    Pancreatic cancer Neg Hx     Social History   Tobacco Use   Smoking status: Never   Smokeless tobacco: Never  Substance Use Topics   Alcohol use: No   ROS  Review of Systems  Constitutional: Negative for malaise/fatigue.  Cardiovascular:  Positive for palpitations. Negative for chest pain, dyspnea on exertion, leg swelling and syncope.  Gastrointestinal:  Positive for constipation and hemorrhoids (not active). Negative for melena.  Objective  Blood pressure (!) 162/73, pulse 71, temperature 97.8 F (36.6 C), temperature source Temporal, resp. rate 16, height 5\' 4"  (1.626 m), weight 211 lb (95.7 kg).   Vitals with BMI 01/10/2021 01/02/2021 11/29/2020  Height 5\' 4"  5\' 4"  5\' 4"   Weight 211 lbs (No Data) 217 lbs  BMI 57.8 - 46.96  Systolic 295 284 132  Diastolic 73 64 76  Pulse 71 71 74   Orthostatic VS for the past 72 hrs (Last 3  readings):  Patient Position BP Location Cuff Size  01/10/21 1301 Sitting Left Arm Large     Physical Exam Constitutional:      Comments: Moderately obese  Cardiovascular:     Rate and Rhythm: Normal rate and regular rhythm.     Pulses: Intact distal pulses.          Carotid pulses are  on the right side with bruit and  on the left side with bruit.    Heart sounds: Murmur heard.  Systolic murmur is present with a grade of 2/6 at the upper right sternal border.    No gallop.     Comments: No leg edema, no JVD. Right arm AV fistula noted and functioning. Pulmonary:     Effort: Pulmonary effort is normal.     Breath sounds: Normal breath sounds.  Abdominal:     General: Bowel sounds are  normal.     Palpations: Abdomen is soft.   Laboratory examination:   Recent Labs    01/16/20 0305 01/17/20 0408 01/18/20 0714 04/10/20 0810 11/20/20 1025 11/24/20 0920 11/26/20 0845  NA 137 137 137   < > 139 139 138  K 4.1 3.7 3.7   < > 3.6 3.6 2.9*  CL 102 100 99   < > 99 100 103  CO2 24 27 26   --  26 29 28   GLUCOSE 100* 100* 111*   < > 91 110* 106*  BUN 46* 16 25*   < > 49* 45* 40*  CREATININE 11.44* 6.36* 9.19*   < > 7.57* 6.95* 6.02*  CALCIUM 8.0* 8.9 8.7*  --  10.1 9.4 8.2*  GFRNONAA 3* 6* 4*  --   --  6* 7*  GFRAA 3* 7* 4*  --   --   --   --    < > = values in this interval not displayed.   CrCl cannot be calculated (Patient's most recent lab result is older than the maximum 21 days allowed.).  CMP Latest Ref Rng & Units 11/26/2020 11/24/2020 11/20/2020  Glucose 70 - 99 mg/dL 106(H) 110(H) 91  BUN 8 - 23 mg/dL 40(H) 45(H) 49(H)  Creatinine 0.44 - 1.00 mg/dL 6.02(H) 6.95(H) 7.57(HH)  Sodium 135 - 145 mmol/L 138 139 139  Potassium 3.5 - 5.1 mmol/L 2.9(L) 3.6 3.6  Chloride 98 - 111 mmol/L 103 100 99  CO2 22 - 32 mmol/L 28 29 26   Calcium 8.9 - 10.3 mg/dL 8.2(L) 9.4 10.1  Total Protein 6.5 - 8.1 g/dL - 7.3 8.3  Total Bilirubin 0.3 - 1.2 mg/dL - 0.6 0.7  Alkaline Phos 38 - 126 U/L -  100 106  AST 15 - 41 U/L - 17 12  ALT 0 - 44 U/L - 15 10   CBC Latest Ref Rng & Units 11/26/2020 11/24/2020 11/20/2020  WBC 4.0 - 10.5 K/uL 9.3 6.9 6.5  Hemoglobin 12.0 - 15.0 g/dL 10.4(L) 11.8(L) 11.7(L)  Hematocrit 36.0 - 46.0 % 34.2(L) 38.9 35.7(L)  Platelets 150 - 400 K/uL 234 273 196.0   Lipid Panel     Component Value Date/Time   CHOL 198 10/06/2019 1510   TRIG 51 10/06/2019 1510   HDL 53 10/06/2019 1510   CHOLHDL 3.7 10/06/2019 1510   VLDL 10 10/06/2019 1510   LDLCALC 135 (H) 10/06/2019 1510    Lipid Panel No results for input(s): CHOL, TRIG, LDLCALC, VLDL, HDL, CHOLHDL, LDLDIRECT in the last 8760 hours. HEMOGLOBIN A1C No results found for: HGBA1C, MPG   TSH Recent Labs    01/13/20 0733 11/20/20 1025  TSH 0.351 1.36   External labs:  Labs 02/23/2021:  151, creatinine 8.48, potassium 3.7, sodium 142.  Hb 9.0/HCT 30.6, normal indicis.  Medications and allergies   Allergies  Allergen Reactions   Latex Rash   Penicillins Other (See Comments)    Yeast infection / Childhood   Sulfa Antibiotics Rash   Tape Other (See Comments)    Plastic, silicone, and paper tape causes bruising and pulls off skin. Cloth tape works fine     Current Outpatient Medications on File Prior to Visit  Medication Sig Dispense Refill   albuterol (PROVENTIL HFA;VENTOLIN HFA) 108 (90 Base) MCG/ACT inhaler Inhale 1 puff into the lungs every 6 (six) hours as needed for wheezing or shortness of breath. (Patient taking differently: Inhale 2 puffs into the lungs every 6 (six) hours as needed for wheezing or  shortness of breath.) 6.7 g 2   B Complex-C-Zn-Folic Acid (DIALYVITE 606 WITH ZINC) 0.8 MG TABS Take 1 tablet by mouth daily.     carvedilol (COREG) 25 MG tablet Take 1.5 tablets (37.5 mg total) by mouth 2 (two) times daily with a meal. 240 tablet 3   diltiazem (CARDIZEM) 60 MG tablet Take 1 tablet (60 mg total) by mouth 3 (three) times daily as needed (Atrial fibrillation). 30 tablet 2    DULoxetine (CYMBALTA) 20 MG capsule Take 2 capsules (40 mg total) by mouth daily. 180 capsule 2   ethyl chloride spray Apply 1 application topically every Monday, Wednesday, and Friday with hemodialysis.  12   hydrALAZINE (APRESOLINE) 25 MG tablet Take 1 tablet (25 mg total) by mouth 3 (three) times daily. (Patient taking differently: Take 50 mg by mouth 3 (three) times daily.) 270 tablet 3   isosorbide dinitrate (ISORDIL) 30 MG tablet Take 1 tablet (30 mg total) by mouth 3 (three) times daily. 270 tablet 3   LINZESS 145 MCG CAPS capsule TAKE 1 CAPSULE BY MOUTH ONCE DAILY BEFORE BREAKFAST 30 capsule 0   Menthol, Topical Analgesic, (BIOFREEZE ROLL-ON) 4 % GEL Apply 1 application topically daily as needed (pain).     Methoxy PEG-Epoetin Beta (MIRCERA IJ) Mircera     omeprazole (PRILOSEC) 40 MG capsule TAKE 1 CAPSULE BY MOUTH TWICE DAILY BEFORE A MEAL 60 capsule 0   warfarin (COUMADIN) 5 MG tablet USE AS DIRECTED BY COUMADIN CLINIC (Patient taking differently: Take 2.5-5 mg by mouth See admin instructions. USE AS DIRECTED BY COUMADIN CLINIC Take 2.5mg  on M,W,F and 5mg  all other days) 113 tablet 5   No current facility-administered medications on file prior to visit.   Radiology:   No results found.  Cardiac Studies:    Lexiscan myoview stress test 09/06/2018:  1. Lexiscan stress test was performed. Exercise capacity was not assessed. Stress symptoms included dyspnea, dizziness. Peak effect blood pressure was 158/68 mmHg. The resting and stress electrocardiogram demonstrated normal sinus rhythm, normal resting conduction, no resting arrhythmias and normal rest repolarization.  Stress EKG is non diagnostic for ischemia as it is a pharmacologic stress.  2. The overall quality of the study is good. There is no evidence of abnormal lung activity. Stress and rest SPECT images demonstrate homogeneous tracer distribution throughout the myocardium. Gated SPECT imaging reveals normal myocardial thickening  and wall motion. The left ventricular ejection fraction was normal (61%).   3. Low risk study.  Carotid artery duplex  04/24/2020: Stenosis in the right external carotid artery (<50%). Minimal stenosis in the left internal carotid artery (minimal). Stenosis in the left common carotid artery (<50%). There is mild diffuse heterogeneous plaque in bilateral carotid arteries. Antegrade right vertebral artery flow.  Follow up in one year is appropriate if clinically indicated. No significant change from 12/02/2016.  Echocardiogram 12/27/2020: Normal LV systolic function with visual EF 60-65%. Left ventricle cavity is normal in size. Moderate left ventricular hypertrophy. Normal global wallmotion. Indeterminate diastolic filling pattern, elevated LAP. No significant valvular heart disease. Compared to study dated 11/24/2017: LVEF is preserved, G2DD is now indeterminate with elevated LAP, MR and TR have improved, otherwise nosignificant change.  EKG  EKG 01/10/2021: Normal sinus rhythm at rate of 76 bpm, left atrial enlargement, left axis deviation, left intrafascicular block.  Poor R progression, cannot exclude anteroseptal infarct old.  No significant change from 11/29/2020.     EKG 11/26/2020: Atypical atrial flutter with RVR at rate of 136 bpm, left  axis deviation, left anterior fascicular block.  No evidence of ischemia.   EKG 10/06/2019: Normal sinus rhythm with rate of 67 bpm, left atrial enlargement, left axis deviation, left anterior fascicular block.  Anteroseptal infarct old.  No evidence of ischemia.  Low-voltage complexes.  No significant change from EKG 04/12/2019.   Assessment     ICD-10-CM   1. Paroxysmal atrial fibrillation (HCC)  I48.0 EKG 12-Lead    POCT INR    amiodarone (PACERONE) 200 MG tablet    2. Monitoring for long-term anticoagulant use  Z51.81 POCT INR   Z79.01     3. Primary hypertension  I10 amLODipine (NORVASC) 10 MG tablet      CHA2DS2-VASc Score is 4.   Yearly risk of stroke: 4.8% (HTN, female, age 60).  Score of 1=0.6; 2=2.2; 3=3.2; 4=4.8; 5=7.2; 6=9.8; 7=>9.8) -(CHF; HTN; vasc disease DM,  Female = 1; Age <65 =0; 65-74 = 1,  >75 =2; stroke/embolism= 2).   Meds ordered this encounter  Medications   amiodarone (PACERONE) 200 MG tablet    Sig: Take 1 tablet (200 mg total) by mouth daily.    Dispense:  90 tablet    Refill:  1   amLODipine (NORVASC) 10 MG tablet    Sig: Take 1 tablet (10 mg total) by mouth daily.    Dispense:  90 tablet    Refill:  3     Medications Discontinued During This Encounter  Medication Reason   amLODipine (NORVASC) 5 MG tablet Dose change   amiodarone (PACERONE) 200 MG tablet Reorder      Recommendations:   Kaylamarie Swickard  is a 75 y.o. AA female patient with paroxysmal atrial fibrillation, on low-dose amiodarone, dose reduced due to prolonged QT, end-stage disease on hemodialysis on Monday Wednesday and Friday, orthostatic hypotension, obstructive sleep apnea not on CPAP, external hemorrhoids but tolerating Coumadin. She presented with chest pain and A. fib with RVR to the emergency room on 11/26/2020 and received 1 dose of diltiazem with improvement in heart rate and was recommended to increase amiodarone to 200 mg twice daily.  She is tolerating amiodarone and EKG reveals maintenance of sinus rhythm and QT interval has remained stable and normal.  Advised her to reduce the dose of the amiodarone to 200 mg daily.  With regard to hypertension, post dialysis days blood pressure has been very well controlled.  However I would like to increase the amlodipine dose to 10 mg daily, patient has been losing weight as she is got nausea and does not like the food that she eats.  Advised her that she should continue to aim to lose more weight and this should certainly help with blood pressure control as well.  Having made diet changes, she may not develop significant leg edema from amlodipine.  Other option if blood pressure is  not controlled would be changing isosorbide dinitrate and hydralazine to BiDil.  Otherwise stable from cardiac standpoint, I will see her back in 6 months or sooner if problems, patient will continue to monitor herself regarding blood pressure.  She is very compliant.  Her INR is therapeutic today.  We will recheck in 2 weeks as we are making changes to her amiodarone.  She has been scheduled for GI work-up, I do not have any contraindication for the procedure and she can hold Coumadin for 5 days prior to the procedure and restart same dose and will do INR monitoring in office 1 week after she resumes her Coumadin.  We will  send a note to GI.   Adrian Prows, MD, [facility] 01/10/2021, 2:08 PM Office: [phone]

## 2021-01-10 NOTE — Telephone Encounter (Signed)
Inbound call from pt requesting a call back stating that she has questions about her warfarin medication. Please advise. Thanks

## 2021-01-10 NOTE — Telephone Encounter (Signed)
Returned patients call regarding Warfarin. I will need to check with Dr. Einar Gip to make sure that it is okay for patient to hold Coumadin prior to procedure since there has been some changes in hx since last visit with [place]. Clearance will be sent to Dr. Einar Gip. I have informed patient that once we receive a response back from Dr. Einar Gip I will return her call.

## 2021-01-11 DIAGNOSIS — N186 End stage renal disease: Secondary | ICD-10-CM | POA: Diagnosis not present

## 2021-01-11 DIAGNOSIS — N2581 Secondary hyperparathyroidism of renal origin: Secondary | ICD-10-CM | POA: Diagnosis not present

## 2021-01-11 DIAGNOSIS — D509 Iron deficiency anemia, unspecified: Secondary | ICD-10-CM | POA: Diagnosis not present

## 2021-01-11 DIAGNOSIS — D631 Anemia in chronic kidney disease: Secondary | ICD-10-CM | POA: Diagnosis not present

## 2021-01-11 DIAGNOSIS — Z992 Dependence on renal dialysis: Secondary | ICD-10-CM | POA: Diagnosis not present

## 2021-01-11 NOTE — Telephone Encounter (Signed)
Left detailed message for pt - advised that per Dr. Einar Gip okay for her to hold coumadin for 5 days prior to procedure.

## 2021-01-14 DIAGNOSIS — D631 Anemia in chronic kidney disease: Secondary | ICD-10-CM | POA: Diagnosis not present

## 2021-01-14 DIAGNOSIS — Z992 Dependence on renal dialysis: Secondary | ICD-10-CM | POA: Diagnosis not present

## 2021-01-14 DIAGNOSIS — N186 End stage renal disease: Secondary | ICD-10-CM | POA: Diagnosis not present

## 2021-01-14 DIAGNOSIS — N2581 Secondary hyperparathyroidism of renal origin: Secondary | ICD-10-CM | POA: Diagnosis not present

## 2021-01-14 DIAGNOSIS — D509 Iron deficiency anemia, unspecified: Secondary | ICD-10-CM | POA: Diagnosis not present

## 2021-01-16 ENCOUNTER — Encounter: Payer: Self-pay | Admitting: Physical Medicine & Rehabilitation

## 2021-01-16 DIAGNOSIS — D509 Iron deficiency anemia, unspecified: Secondary | ICD-10-CM | POA: Diagnosis not present

## 2021-01-16 DIAGNOSIS — N186 End stage renal disease: Secondary | ICD-10-CM | POA: Diagnosis not present

## 2021-01-16 DIAGNOSIS — D631 Anemia in chronic kidney disease: Secondary | ICD-10-CM | POA: Diagnosis not present

## 2021-01-16 DIAGNOSIS — Z992 Dependence on renal dialysis: Secondary | ICD-10-CM | POA: Diagnosis not present

## 2021-01-16 DIAGNOSIS — N2581 Secondary hyperparathyroidism of renal origin: Secondary | ICD-10-CM | POA: Diagnosis not present

## 2021-01-17 DIAGNOSIS — I10 Essential (primary) hypertension: Secondary | ICD-10-CM | POA: Diagnosis not present

## 2021-01-17 DIAGNOSIS — I480 Paroxysmal atrial fibrillation: Secondary | ICD-10-CM | POA: Diagnosis not present

## 2021-01-18 DIAGNOSIS — D631 Anemia in chronic kidney disease: Secondary | ICD-10-CM | POA: Diagnosis not present

## 2021-01-18 DIAGNOSIS — N2581 Secondary hyperparathyroidism of renal origin: Secondary | ICD-10-CM | POA: Diagnosis not present

## 2021-01-18 DIAGNOSIS — Z992 Dependence on renal dialysis: Secondary | ICD-10-CM | POA: Diagnosis not present

## 2021-01-18 DIAGNOSIS — I158 Other secondary hypertension: Secondary | ICD-10-CM | POA: Diagnosis not present

## 2021-01-18 DIAGNOSIS — N186 End stage renal disease: Secondary | ICD-10-CM | POA: Diagnosis not present

## 2021-01-21 DIAGNOSIS — Z992 Dependence on renal dialysis: Secondary | ICD-10-CM | POA: Diagnosis not present

## 2021-01-21 DIAGNOSIS — N2581 Secondary hyperparathyroidism of renal origin: Secondary | ICD-10-CM | POA: Diagnosis not present

## 2021-01-21 DIAGNOSIS — D631 Anemia in chronic kidney disease: Secondary | ICD-10-CM | POA: Diagnosis not present

## 2021-01-21 DIAGNOSIS — N186 End stage renal disease: Secondary | ICD-10-CM | POA: Diagnosis not present

## 2021-01-22 ENCOUNTER — Ambulatory Visit: Payer: Medicare Other | Admitting: Family Medicine

## 2021-01-23 DIAGNOSIS — Z992 Dependence on renal dialysis: Secondary | ICD-10-CM | POA: Diagnosis not present

## 2021-01-23 DIAGNOSIS — N186 End stage renal disease: Secondary | ICD-10-CM | POA: Diagnosis not present

## 2021-01-23 DIAGNOSIS — D631 Anemia in chronic kidney disease: Secondary | ICD-10-CM | POA: Diagnosis not present

## 2021-01-23 DIAGNOSIS — N2581 Secondary hyperparathyroidism of renal origin: Secondary | ICD-10-CM | POA: Diagnosis not present

## 2021-01-24 ENCOUNTER — Ambulatory Visit: Payer: Medicare Other | Admitting: Pharmacist

## 2021-01-24 ENCOUNTER — Other Ambulatory Visit: Payer: Self-pay

## 2021-01-24 ENCOUNTER — Ambulatory Visit (INDEPENDENT_AMBULATORY_CARE_PROVIDER_SITE_OTHER): Payer: Medicare Other | Admitting: Podiatry

## 2021-01-24 DIAGNOSIS — I480 Paroxysmal atrial fibrillation: Secondary | ICD-10-CM | POA: Diagnosis not present

## 2021-01-24 DIAGNOSIS — Z7901 Long term (current) use of anticoagulants: Secondary | ICD-10-CM | POA: Diagnosis not present

## 2021-01-24 DIAGNOSIS — M722 Plantar fascial fibromatosis: Secondary | ICD-10-CM | POA: Diagnosis not present

## 2021-01-24 DIAGNOSIS — Z5181 Encounter for therapeutic drug level monitoring: Secondary | ICD-10-CM | POA: Diagnosis not present

## 2021-01-24 DIAGNOSIS — M76822 Posterior tibial tendinitis, left leg: Secondary | ICD-10-CM | POA: Diagnosis not present

## 2021-01-24 LAB — POCT INR: INR: 4.7 — AB (ref 2.0–3.0)

## 2021-01-24 NOTE — Progress Notes (Signed)
Anticoagulation Management Jane [name] is a 75 y.o. female who reports to the clinic for monitoring of warfarin treatment.    Indication: atrial fibrillation CHA2DS2 Vasc Score 4 (Age >81, female, HTN hx), HAS-BLED 2 (Age>65, renal disease)  Duration: indefinite Supervising physician: Adrian Prows  Anticoagulation Clinic Visit History:  Patient does not report signs/symptoms of bleeding or thromboembolism.  Other recent changes: Pt continues to have persistent chronic pain in her back, knee, and neck. Significantly affecting patient's QOLs and ADLs. Working with physical therapy and podiatry to help address pain.   Pt is scheduled to undergo EGD on 01/31/21. Pt was instructed to decrease her amiodarone dose from 200 mg BID to Qday but pt never made that change. Pt to start taking 200 mg Qday starting today   Anticoagulation Episode Summary     Current INR goal:  2.0-3.0  TTR:  66.6 % (2.3 y)  Next INR check:  02/07/2021  INR from last check:  4.7 (01/24/2021)  Weekly max warfarin dose:    Target end date:  Indefinite  INR check location:    Preferred lab:    Send INR reminders to:     Indications   Paroxysmal atrial fibrillation (HCC) [I48.0] Monitoring for long-term anticoagulant use [Z51.81 Z79.01]        Comments:           Allergies  Allergen Reactions   Latex Rash   Penicillins Other (See Comments)    Yeast infection / Childhood   Sulfa Antibiotics Rash   Tape Other (See Comments)    Plastic, silicone, and paper tape causes bruising and pulls off skin. Cloth tape works fine    Current Outpatient Medications:    albuterol (PROVENTIL HFA;VENTOLIN HFA) 108 (90 Base) MCG/ACT inhaler, Inhale 1 puff into the lungs every 6 (six) hours as needed for wheezing or shortness of breath. (Patient taking differently: Inhale 2 puffs into the lungs every 6 (six) hours as needed for wheezing or shortness of breath.), Disp: 6.7 g, Rfl: 2   amiodarone (PACERONE) 200 MG tablet, Take 1  tablet (200 mg total) by mouth daily., Disp: 90 tablet, Rfl: 1   amLODipine (NORVASC) 10 MG tablet, Take 1 tablet (10 mg total) by mouth daily. (Patient taking differently: Take 10 mg by mouth at bedtime.), Disp: 90 tablet, Rfl: 3   B Complex-C-Zn-Folic Acid (DIALYVITE 591 WITH ZINC) 0.8 MG TABS, Take 1 tablet by mouth daily., Disp: , Rfl:    carvedilol (COREG) 25 MG tablet, Take 1.5 tablets (37.5 mg total) by mouth 2 (two) times daily with a meal., Disp: 240 tablet, Rfl: 3   diltiazem (CARDIZEM) 60 MG tablet, Take 1 tablet (60 mg total) by mouth 3 (three) times daily as needed (Atrial fibrillation)., Disp: 30 tablet, Rfl: 2   DULoxetine (CYMBALTA) 20 MG capsule, Take 2 capsules (40 mg total) by mouth daily., Disp: 180 capsule, Rfl: 2   ethyl chloride spray, Apply 1 application topically every Monday, Wednesday, and Friday with hemodialysis., Disp: , Rfl: 12   hydrALAZINE (APRESOLINE) 25 MG tablet, Take 1 tablet (25 mg total) by mouth 3 (three) times daily. (Patient taking differently: Take 50 mg by mouth 3 (three) times daily.), Disp: 270 tablet, Rfl: 3   isosorbide dinitrate (ISORDIL) 30 MG tablet, Take 1 tablet (30 mg total) by mouth 3 (three) times daily., Disp: 270 tablet, Rfl: 3   LINZESS 145 MCG CAPS capsule, TAKE 1 CAPSULE BY MOUTH ONCE DAILY BEFORE BREAKFAST (Patient taking differently: Take 145  mcg by mouth daily before breakfast.), Disp: 30 capsule, Rfl: 0   Methoxy PEG-Epoetin Beta (MIRCERA IJ), Mircera, Disp: , Rfl:    omeprazole (PRILOSEC) 40 MG capsule, TAKE 1 CAPSULE BY MOUTH TWICE DAILY BEFORE A MEAL (Patient taking differently: Take 40 mg by mouth daily.), Disp: 60 capsule, Rfl: 0   warfarin (COUMADIN) 5 MG tablet, USE AS DIRECTED BY COUMADIN CLINIC (Patient taking differently: Take 2.5-5 mg by mouth See admin instructions. USE AS DIRECTED BY COUMADIN CLINIC  Take 7.5 mg on M,W,F and 5mg  all other days), Disp: 113 tablet, Rfl: 5 Past Medical History:  Diagnosis Date   Acid reflux     Anemia of chronic disease    Arthritis    Asthma    Atrial fibrillation (HCC)    Bilateral carotid bruits    Complication of anesthesia    "hard to wake up, I have sleep apnea" no CPAP   Depression    Diverticulitis    Duodenal ulcer    Dysrhythmia    Afib   ESRD (end stage renal disease) ([place])    MWF [place]   Headache    History of blood transfusion    Hypertension    Malaise and fatigue    Orthostatic hypotension    Shortness of breath    " when I walk to fast"   Sleep apnea    Syncope    Tubulovillous adenoma of colon    ASSESSMENT  Recent Results: The most recent result is correlated with 27.5 mg per week:  Lab Results  Component Value Date   INR 4.7 (A) 01/24/2021   INR 2.6 01/10/2021   INR 2.8 12/27/2020    Anticoagulation Dosing: Description   INR above goal. HOLD 7/7 - 7/13. Restart on previous controlled home dose of 2.5 mg every Mon, Wed, Fri and 5 mg all other days. Recheck INR in 2 weeks.       INR today: Supratherapeutic. In the setting of pt never decreasing her amiodarone dose as planned. Pt continues to report that she has complains of fatigue and tiredness. Pt scheduled for GI procedure and aware to hold warfarin 5 days prior and restarting on home dose after the procedure. Pt denies any complains of bleeding or bruising symptoms.  Denies any other relevant changes in her diet, medications, or lifestyle. Will hold in setting of unexplained supratherapeutic INR and recheck INR 1 week post procedure.    PLAN Weekly dose was unchanged by 0% to 27.5 mg/week. HOLD 7/7-7/13. Continue current weekly dose of 2.5 mg every Mon, Wed, Fri and 5 mg all other days. Recheck INR in 2 weeks.     Patient Instructions  INR above goal. HOLD 7/7 - 7/13. Restart on previous controlled home dose of 2.5 mg every Mon, Wed, Fri and 5 mg all other days. Recheck INR in 2 weeks.  Patient advised to contact clinic or seek medical attention if signs/symptoms of bleeding  or thromboembolism occur.  Patient verbalized understanding by repeating back information and was advised to contact me if further medication-related questions arise.   Follow-up Return in about 2 weeks (around 02/07/2021).  [name], PharmD  15 minutes spent face-to-face with the patient during the encounter. 50% of time spent on education, including signs/sx bleeding and clotting, as well as food and drug interactions with warfarin. 50% of time was spent on fingerprick POC INR sample collection,processing, results determination, and documentation

## 2021-01-25 DIAGNOSIS — N2581 Secondary hyperparathyroidism of renal origin: Secondary | ICD-10-CM | POA: Diagnosis not present

## 2021-01-25 DIAGNOSIS — Z992 Dependence on renal dialysis: Secondary | ICD-10-CM | POA: Diagnosis not present

## 2021-01-25 DIAGNOSIS — N186 End stage renal disease: Secondary | ICD-10-CM | POA: Diagnosis not present

## 2021-01-25 DIAGNOSIS — D631 Anemia in chronic kidney disease: Secondary | ICD-10-CM | POA: Diagnosis not present

## 2021-01-25 NOTE — Patient Instructions (Signed)
INR above goal. HOLD 7/7 - 7/13. Restart on previous controlled home dose of 2.5 mg every Mon, Wed, Fri and 5 mg all other days. Recheck INR in 2 weeks.

## 2021-01-27 NOTE — Progress Notes (Signed)
  Subjective:  [name], female    DOB: October 17, 1945,  MRN: [id number]  Chief Complaint  Patient presents with   Follow-up    Patient stated that the CAM boot was helpful overall but over the weekend she experienced some shooting pain that started in the buttocks, down the leg through the hip and down to the toes off and on all day making it hard to walk in the boot. Patient also stated that she was not able to wear the boot for a few days d/t leg swelling. Patient is interested in have the MRI ordered at this time.     75 y.o. female returns for follow-up with the above complaint. History confirmed with patient.  Has been able to wear the boot explain quite a bit of pain  Objective:  Physical Exam: warm, good capillary refill, no trophic changes or ulcerative lesions, normal DP and PT pulses and normal sensory exam. Left Foot: Pain on palpation at the insertion of the posterior tibial tendon on the navicular and with resisted inversion Right Foot: No pain    Radiographs: X-ray of both feet reviewed: no fracture, dislocation, swelling or degenerative changes noted Assessment:   1. Posterior tibial tendinitis of left lower extremity   2. Plantar fasciitis of right foot       Plan:  Patient was evaluated and treated and all questions answered.   Overall still has not had any improvement even with CAM boot immobilization.  I think we should order MRI to evaluate for any possible tearing in the tendon as well as possibility of accessory navicular involvement.  She will return after MRI for discussion of surgical and nonsurgical options to treat this  Return in about 4 weeks (around 02/21/2021) for after MRI to review.

## 2021-01-28 ENCOUNTER — Telehealth: Payer: Self-pay | Admitting: Gastroenterology

## 2021-01-28 ENCOUNTER — Other Ambulatory Visit (HOSPITAL_COMMUNITY)
Admission: RE | Admit: 2021-01-28 | Discharge: 2021-01-28 | Disposition: A | Discharge status: Home / Self Care | Payer: Medicare Other | Source: Ambulatory Visit | Attending: Gastroenterology | Admitting: Gastroenterology

## 2021-01-28 DIAGNOSIS — Z992 Dependence on renal dialysis: Secondary | ICD-10-CM | POA: Diagnosis not present

## 2021-01-28 DIAGNOSIS — N186 End stage renal disease: Secondary | ICD-10-CM | POA: Diagnosis not present

## 2021-01-28 DIAGNOSIS — Z20822 Contact with and (suspected) exposure to covid-19: Secondary | ICD-10-CM | POA: Insufficient documentation

## 2021-01-28 DIAGNOSIS — Z01812 Encounter for preprocedural laboratory examination: Secondary | ICD-10-CM | POA: Insufficient documentation

## 2021-01-28 DIAGNOSIS — N2581 Secondary hyperparathyroidism of renal origin: Secondary | ICD-10-CM | POA: Diagnosis not present

## 2021-01-28 DIAGNOSIS — Z8719 Personal history of other diseases of the digestive system: Secondary | ICD-10-CM

## 2021-01-28 DIAGNOSIS — D631 Anemia in chronic kidney disease: Secondary | ICD-10-CM | POA: Diagnosis not present

## 2021-01-28 LAB — SARS CORONAVIRUS 2 (TAT 6-24 HRS): SARS Coronavirus 2: NEGATIVE

## 2021-01-28 NOTE — Telephone Encounter (Signed)
Left message on machine to call back  

## 2021-01-28 NOTE — Telephone Encounter (Signed)
Inbound call from pt requesting a call back stating that she is having rectal bleeding. Please advise. Thank you.

## 2021-01-28 NOTE — Telephone Encounter (Signed)
I spoke with the pt's daughter and she tells me that the pt has had several episodes of bright red rectal bleeding with BM's since Friday.  Three episodes on Friday, one on Saturday, no episodes on Sunday and one today.  NO other symptoms.  No bleeding unless she has a BM.  She has an EGD appt on 7/14.  Does she need to do anything prior to that appt? I did advise her to take the pt to the ED if the bleeding becomes heavy and independent of stool.

## 2021-01-29 NOTE — Telephone Encounter (Signed)
Suspect hemorrhoidal based on description as she had full colonoscopy in 2021. Can give Anusol suppositories nightly for now as well or Preparation H OTC suppositories. CBC in the next couple of days would be ideal. If significant bleeding, then alert [place] and can consider add-on Flex Sig. FYI Dr. Hilarie Fredrickson on your patient. GM

## 2021-01-29 NOTE — Telephone Encounter (Signed)
I spoke with the pt and advised her of the recommendations per Dr Rush Landmark.  She tells me that she has had no further bleeding but will try OTC Prep H if the bleeding returns.  She will come in tomorrow for CBC. Order entered.

## 2021-01-30 ENCOUNTER — Encounter (HOSPITAL_COMMUNITY): Payer: Self-pay | Admitting: Gastroenterology

## 2021-01-30 ENCOUNTER — Other Ambulatory Visit: Payer: Self-pay

## 2021-01-30 ENCOUNTER — Other Ambulatory Visit (INDEPENDENT_AMBULATORY_CARE_PROVIDER_SITE_OTHER): Payer: Medicare Other

## 2021-01-30 DIAGNOSIS — N186 End stage renal disease: Secondary | ICD-10-CM | POA: Diagnosis not present

## 2021-01-30 DIAGNOSIS — I4891 Unspecified atrial fibrillation: Secondary | ICD-10-CM | POA: Diagnosis not present

## 2021-01-30 DIAGNOSIS — Z992 Dependence on renal dialysis: Secondary | ICD-10-CM | POA: Diagnosis not present

## 2021-01-30 DIAGNOSIS — D631 Anemia in chronic kidney disease: Secondary | ICD-10-CM | POA: Diagnosis not present

## 2021-01-30 DIAGNOSIS — E1129 Type 2 diabetes mellitus with other diabetic kidney complication: Secondary | ICD-10-CM | POA: Diagnosis not present

## 2021-01-30 DIAGNOSIS — Z8719 Personal history of other diseases of the digestive system: Secondary | ICD-10-CM

## 2021-01-30 DIAGNOSIS — N2581 Secondary hyperparathyroidism of renal origin: Secondary | ICD-10-CM | POA: Diagnosis not present

## 2021-01-30 LAB — CBC WITH DIFFERENTIAL/PLATELET
Basophils Absolute: 0 10*3/uL (ref 0.0–0.1)
Basophils Relative: 0.6 % (ref 0.0–3.0)
Eosinophils Absolute: 0.4 10*3/uL (ref 0.0–0.7)
Eosinophils Relative: 7.5 % — ABNORMAL HIGH (ref 0.0–5.0)
HCT: 28.2 % — ABNORMAL LOW (ref 36.0–46.0)
Hemoglobin: 9.2 g/dL — ABNORMAL LOW (ref 12.0–15.0)
Lymphocytes Relative: 16 % (ref 12.0–46.0)
Lymphs Abs: 0.8 10*3/uL (ref 0.7–4.0)
MCHC: 32.6 g/dL (ref 30.0–36.0)
MCV: 85.2 fl (ref 78.0–100.0)
Monocytes Absolute: 0.7 10*3/uL (ref 0.1–1.0)
Monocytes Relative: 13.2 % — ABNORMAL HIGH (ref 3.0–12.0)
Neutro Abs: 3.3 10*3/uL (ref 1.4–7.7)
Neutrophils Relative %: 62.7 % (ref 43.0–77.0)
Platelets: 189 10*3/uL (ref 150.0–400.0)
RBC: 3.31 Mil/uL — ABNORMAL LOW (ref 3.87–5.11)
RDW: 18.4 % — ABNORMAL HIGH (ref 11.5–15.5)
WBC: 5.2 10*3/uL (ref 4.0–10.5)

## 2021-01-30 NOTE — Progress Notes (Signed)
PCP - Dr. [name]  Cardiologist - Dr. Nadyne Coombes EKG - 01/10/21 Chest x-ray -  ECHO - 12/27/20 Cardiac Cath -  CPAP - pt has not received a call back regarding sleep study   Blood Thinner Instructions: per pt, she started bleeding rectally and was instructed to stop coumadin over a week ago and has not taken any since she contacted MD about bleeding. Per pt, cannot recall exact date of last dose but does confirm stopping over a week ago Aspirin Instructions:   ERAS Protcol -   COVID TEST- negative   Anesthesia review: n/a  -------------  SDW INSTRUCTIONS:  Your procedure is scheduled on 7/14 Thursday. Please report to [facility] Main Entrance "A" at [place].M., and check in at the Admitting office. Call this number if you have problems the morning of surgery: [phone]   Remember: Do not eat or drink after midnight the night before your surgery   Medications to take morning of surgery with a sip of water include: Inhaler --- Please bring all inhalers with you the day of surgery.  amiodarone (PACERONE) amLODipine (NORVASC)  carvedilol (COREG)  diltiazem (CARDIZEM)  DULoxetine (CYMBALTA) hydrALAZINE (APRESOLINE)  isosorbide dinitrate (ISORDIL)  warfarin (COUMADIN)  As of today, STOP taking any Aspirin (unless otherwise instructed by your surgeon), Aleve, Naproxen, Ibuprofen, Motrin, Advil, Goody's, BC's, all herbal medications, fish oil, and all vitamins.    The Morning of Surgery Do not wear jewelry, make-up or nail polish. Do not wear lotions, powders, or perfumes, or deodorant Do not shave 48 hours prior to surgery.   Do not bring valuables to the hospital. [facility] is not responsible for any belongings or valuables.  If you are a smoker, DO NOT Smoke 24 hours prior to surgery If you wear a CPAP at night please bring your mask the morning of surgery  Remember that you must have someone to transport you home after your surgery, and remain with you for 24 hours if you are  discharged the same day.  Please bring cases for contacts, glasses, hearing aids, dentures or bridgework because it cannot be worn into surgery.   Patients discharged the day of surgery will not be allowed to drive home.   Please shower the NIGHT BEFORE/MORNING OF SURGERY (use antibacterial soap like DIAL soap if possible). Wear comfortable clothes the morning of surgery. Oral Hygiene is also important to reduce your risk of infection.  Remember - BRUSH YOUR TEETH THE MORNING OF SURGERY WITH YOUR REGULAR TOOTHPASTE  Patient denies shortness of breath, fever, cough and chest pain.

## 2021-01-31 ENCOUNTER — Inpatient Hospital Stay (HOSPITAL_COMMUNITY)
Admission: RE | Admit: 2021-01-31 | Discharge: 2021-02-03 | DRG: 377 | Disposition: A | Discharge status: Home / Self Care | Payer: Medicare Other | Attending: Internal Medicine | Admitting: Internal Medicine

## 2021-01-31 ENCOUNTER — Encounter (HOSPITAL_COMMUNITY)
Admission: RE | Disposition: A | Discharge status: Home / Self Care | Payer: Self-pay | Source: Home / Self Care | Attending: Internal Medicine

## 2021-01-31 ENCOUNTER — Telehealth: Payer: Self-pay | Admitting: Internal Medicine

## 2021-01-31 ENCOUNTER — Encounter (HOSPITAL_COMMUNITY): Payer: Self-pay | Admitting: Gastroenterology

## 2021-01-31 ENCOUNTER — Ambulatory Visit (HOSPITAL_COMMUNITY): Payer: Medicare Other | Admitting: Certified Registered"

## 2021-01-31 DIAGNOSIS — N186 End stage renal disease: Secondary | ICD-10-CM

## 2021-01-31 DIAGNOSIS — Z880 Allergy status to penicillin: Secondary | ICD-10-CM

## 2021-01-31 DIAGNOSIS — Z91040 Latex allergy status: Secondary | ICD-10-CM

## 2021-01-31 DIAGNOSIS — K449 Diaphragmatic hernia without obstruction or gangrene: Secondary | ICD-10-CM | POA: Diagnosis present

## 2021-01-31 DIAGNOSIS — Z539 Procedure and treatment not carried out, unspecified reason: Secondary | ICD-10-CM | POA: Diagnosis present

## 2021-01-31 DIAGNOSIS — K317 Polyp of stomach and duodenum: Secondary | ICD-10-CM

## 2021-01-31 DIAGNOSIS — D132 Benign neoplasm of duodenum: Secondary | ICD-10-CM | POA: Diagnosis present

## 2021-01-31 DIAGNOSIS — J45909 Unspecified asthma, uncomplicated: Secondary | ICD-10-CM | POA: Diagnosis present

## 2021-01-31 DIAGNOSIS — D6832 Hemorrhagic disorder due to extrinsic circulating anticoagulants: Secondary | ICD-10-CM | POA: Diagnosis present

## 2021-01-31 DIAGNOSIS — K921 Melena: Secondary | ICD-10-CM | POA: Diagnosis not present

## 2021-01-31 DIAGNOSIS — F331 Major depressive disorder, recurrent, moderate: Secondary | ICD-10-CM | POA: Diagnosis not present

## 2021-01-31 DIAGNOSIS — D631 Anemia in chronic kidney disease: Secondary | ICD-10-CM | POA: Diagnosis present

## 2021-01-31 DIAGNOSIS — Z9109 Other allergy status, other than to drugs and biological substances: Secondary | ICD-10-CM

## 2021-01-31 DIAGNOSIS — R519 Headache, unspecified: Secondary | ICD-10-CM | POA: Diagnosis present

## 2021-01-31 DIAGNOSIS — Z91048 Other nonmedicinal substance allergy status: Secondary | ICD-10-CM

## 2021-01-31 DIAGNOSIS — K3189 Other diseases of stomach and duodenum: Secondary | ICD-10-CM | POA: Diagnosis present

## 2021-01-31 DIAGNOSIS — K922 Gastrointestinal hemorrhage, unspecified: Secondary | ICD-10-CM | POA: Diagnosis present

## 2021-01-31 DIAGNOSIS — M1990 Unspecified osteoarthritis, unspecified site: Secondary | ICD-10-CM | POA: Diagnosis present

## 2021-01-31 DIAGNOSIS — K264 Chronic or unspecified duodenal ulcer with hemorrhage: Secondary | ICD-10-CM | POA: Diagnosis not present

## 2021-01-31 DIAGNOSIS — R1013 Epigastric pain: Secondary | ICD-10-CM | POA: Diagnosis not present

## 2021-01-31 DIAGNOSIS — Z6841 Body Mass Index (BMI) 40.0 and over, adult: Secondary | ICD-10-CM

## 2021-01-31 DIAGNOSIS — Z9049 Acquired absence of other specified parts of digestive tract: Secondary | ICD-10-CM

## 2021-01-31 DIAGNOSIS — Z823 Family history of stroke: Secondary | ICD-10-CM

## 2021-01-31 DIAGNOSIS — Z992 Dependence on renal dialysis: Secondary | ICD-10-CM

## 2021-01-31 DIAGNOSIS — Z8249 Family history of ischemic heart disease and other diseases of the circulatory system: Secondary | ICD-10-CM

## 2021-01-31 DIAGNOSIS — Z7901 Long term (current) use of anticoagulants: Secondary | ICD-10-CM

## 2021-01-31 DIAGNOSIS — K5521 Angiodysplasia of colon with hemorrhage: Secondary | ICD-10-CM | POA: Diagnosis not present

## 2021-01-31 DIAGNOSIS — Z20822 Contact with and (suspected) exposure to covid-19: Secondary | ICD-10-CM | POA: Diagnosis not present

## 2021-01-31 DIAGNOSIS — K644 Residual hemorrhoidal skin tags: Secondary | ICD-10-CM | POA: Diagnosis present

## 2021-01-31 DIAGNOSIS — K315 Obstruction of duodenum: Secondary | ICD-10-CM | POA: Diagnosis not present

## 2021-01-31 DIAGNOSIS — I480 Paroxysmal atrial fibrillation: Secondary | ICD-10-CM | POA: Diagnosis present

## 2021-01-31 DIAGNOSIS — I120 Hypertensive chronic kidney disease with stage 5 chronic kidney disease or end stage renal disease: Secondary | ICD-10-CM | POA: Diagnosis not present

## 2021-01-31 DIAGNOSIS — K219 Gastro-esophageal reflux disease without esophagitis: Secondary | ICD-10-CM | POA: Diagnosis present

## 2021-01-31 DIAGNOSIS — Z8719 Personal history of other diseases of the digestive system: Secondary | ICD-10-CM | POA: Diagnosis not present

## 2021-01-31 DIAGNOSIS — Z79899 Other long term (current) drug therapy: Secondary | ICD-10-CM

## 2021-01-31 DIAGNOSIS — Z882 Allergy status to sulfonamides status: Secondary | ICD-10-CM

## 2021-01-31 DIAGNOSIS — I951 Orthostatic hypotension: Secondary | ICD-10-CM | POA: Diagnosis not present

## 2021-01-31 DIAGNOSIS — K648 Other hemorrhoids: Secondary | ICD-10-CM | POA: Diagnosis present

## 2021-01-31 DIAGNOSIS — G4730 Sleep apnea, unspecified: Secondary | ICD-10-CM | POA: Diagnosis present

## 2021-01-31 DIAGNOSIS — R109 Unspecified abdominal pain: Secondary | ICD-10-CM | POA: Diagnosis not present

## 2021-01-31 HISTORY — PX: ESOPHAGOGASTRODUODENOSCOPY (EGD) WITH PROPOFOL: SHX5813

## 2021-01-31 HISTORY — PX: HEMOSTASIS CLIP PLACEMENT: SHX6857

## 2021-01-31 HISTORY — PX: BIOPSY: SHX5522

## 2021-01-31 HISTORY — PX: ENDOSCOPIC MUCOSAL RESECTION: SHX6839

## 2021-01-31 LAB — RENAL FUNCTION PANEL
Albumin: 3.1 g/dL — ABNORMAL LOW (ref 3.5–5.0)
Anion gap: 8 (ref 5–15)
BUN: 25 mg/dL — ABNORMAL HIGH (ref 8–23)
CO2: 30 mmol/L (ref 22–32)
Calcium: 9 mg/dL (ref 8.9–10.3)
Chloride: 100 mmol/L (ref 98–111)
Creatinine, Ser: 5.99 mg/dL — ABNORMAL HIGH (ref 0.44–1.00)
GFR, Estimated: 7 mL/min — ABNORMAL LOW (ref >60)
Glucose, Bld: 98 mg/dL (ref 70–99)
Phosphorus: 3.8 mg/dL (ref 2.5–4.6)
Potassium: 4.3 mmol/L (ref 3.5–5.1)
Sodium: 138 mmol/L (ref 135–145)

## 2021-01-31 LAB — CBC
HCT: 29.8 % — ABNORMAL LOW (ref 36.0–46.0)
Hemoglobin: 9 g/dL — ABNORMAL LOW (ref 12.0–15.0)
MCH: 27.3 pg (ref 26.0–34.0)
MCHC: 30.2 g/dL (ref 30.0–36.0)
MCV: 90.3 fL (ref 80.0–100.0)
Platelets: 194 10*3/uL (ref 150–400)
RBC: 3.3 MIL/uL — ABNORMAL LOW (ref 3.87–5.11)
RDW: 18.2 % — ABNORMAL HIGH (ref 11.5–15.5)
WBC: 4 10*3/uL (ref 4.0–10.5)
nRBC: 0 % (ref 0.0–0.2)

## 2021-01-31 LAB — PROTIME-INR
INR: 1.4 — ABNORMAL HIGH (ref 0.8–1.2)
Prothrombin Time: 17.1 seconds — ABNORMAL HIGH (ref 11.4–15.2)

## 2021-01-31 LAB — TYPE AND SCREEN
ABO/RH(D): O POS
Antibody Screen: NEGATIVE

## 2021-01-31 SURGERY — ESOPHAGOGASTRODUODENOSCOPY (EGD) WITH PROPOFOL
Anesthesia: Monitor Anesthesia Care

## 2021-01-31 MED ORDER — PENTAFLUOROPROP-TETRAFLUOROETH EX AERO: INHALATION_SPRAY | CUTANEOUS | Status: DC

## 2021-01-31 MED ORDER — SODIUM CHLORIDE 0.9 % IV SOLN: INTRAVENOUS | Status: DC

## 2021-01-31 MED ORDER — EPHEDRINE SULFATE-NACL 50-0.9 MG/10ML-% IV SOSY
PREFILLED_SYRINGE | INTRAVENOUS | Status: DC | PRN
  Administered 2021-01-31: 10 mg via INTRAVENOUS

## 2021-01-31 MED ORDER — PEG-KCL-NACL-NASULF-NA ASC-C 100 G PO SOLR
0.5000 | Freq: Once | ORAL | Status: AC
  Administered 2021-01-31: 100 g via ORAL
  Filled 2021-01-31: qty 1

## 2021-01-31 MED ORDER — BISACODYL 5 MG PO TBEC: 10.0000 mg | DELAYED_RELEASE_TABLET | Freq: Once | ORAL | Status: DC

## 2021-01-31 MED ORDER — BISACODYL 5 MG PO TBEC
10.0000 mg | DELAYED_RELEASE_TABLET | Freq: Once | ORAL | Status: AC
  Administered 2021-01-31: 10 mg via ORAL
  Filled 2021-01-31: qty 2

## 2021-01-31 MED ORDER — SODIUM CHLORIDE 0.9% FLUSH
3.0000 mL | Freq: Two times a day (BID) | INTRAVENOUS | Status: DC
  Administered 2021-01-31 – 2021-02-03 (×7): 3 mL via INTRAVENOUS

## 2021-01-31 MED ORDER — PEG-KCL-NACL-NASULF-NA ASC-C 100 G PO SOLR: 0.5000 | Freq: Once | ORAL | Status: DC

## 2021-01-31 MED ORDER — AMIODARONE HCL 200 MG PO TABS
200.0000 mg | ORAL_TABLET | Freq: Every day | ORAL | Status: DC
  Administered 2021-01-31 – 2021-02-03 (×4): 200 mg via ORAL
  Filled 2021-01-31 (×4): qty 1

## 2021-01-31 MED ORDER — ETHYL CHLORIDE EX AERO: 1.0000 "application " | INHALATION_SPRAY | CUTANEOUS | Status: DC

## 2021-01-31 MED ORDER — PEG-KCL-NACL-NASULF-NA ASC-C 100 G PO SOLR
0.5000 | Freq: Once | ORAL | Status: AC
  Administered 2021-01-31: 100 g via ORAL

## 2021-01-31 MED ORDER — DULOXETINE HCL 20 MG PO CPEP
40.0000 mg | ORAL_CAPSULE | Freq: Every day | ORAL | Status: DC
  Administered 2021-01-31 – 2021-02-03 (×3): 40 mg via ORAL
  Filled 2021-01-31 (×4): qty 2

## 2021-01-31 MED ORDER — CHLORHEXIDINE GLUCONATE CLOTH 2 % EX PADS
6.0000 | MEDICATED_PAD | Freq: Every day | CUTANEOUS | Status: DC
  Administered 2021-02-01 – 2021-02-03 (×2): 6 via TOPICAL

## 2021-01-31 MED ORDER — METOCLOPRAMIDE HCL 5 MG/ML IJ SOLN
10.0000 mg | Freq: Once | INTRAMUSCULAR | Status: AC
  Administered 2021-02-02: 10 mg via INTRAVENOUS
  Filled 2021-01-31: qty 2

## 2021-01-31 MED ORDER — METOCLOPRAMIDE HCL 5 MG/ML IJ SOLN
10.0000 mg | Freq: Once | INTRAMUSCULAR | Status: AC
  Administered 2021-01-31: 10 mg via INTRAVENOUS
  Filled 2021-01-31: qty 2

## 2021-01-31 MED ORDER — GLYCOPYRROLATE PF 0.2 MG/ML IJ SOSY
PREFILLED_SYRINGE | INTRAMUSCULAR | Status: DC | PRN
  Administered 2021-01-31: .2 mg via INTRAVENOUS

## 2021-01-31 MED ORDER — ONDANSETRON HCL 4 MG/2ML IJ SOLN
INTRAMUSCULAR | Status: DC | PRN
  Administered 2021-01-31: 4 mg via INTRAVENOUS

## 2021-01-31 MED ORDER — ONDANSETRON HCL 4 MG PO TABS: 4.0000 mg | ORAL_TABLET | Freq: Four times a day (QID) | ORAL | Status: DC | PRN

## 2021-01-31 MED ORDER — SODIUM CHLORIDE 0.9 % IV BOLUS: 250.0000 mL | Freq: Once | INTRAVENOUS | Status: DC

## 2021-01-31 MED ORDER — ACETAMINOPHEN 650 MG RE SUPP: 650.0000 mg | Freq: Four times a day (QID) | RECTAL | Status: DC | PRN

## 2021-01-31 MED ORDER — LACTATED RINGERS IV SOLN: INTRAVENOUS | Status: DC | PRN

## 2021-01-31 MED ORDER — PANTOPRAZOLE SODIUM 40 MG PO TBEC
40.0000 mg | DELAYED_RELEASE_TABLET | Freq: Every day | ORAL | Status: DC
  Administered 2021-01-31 – 2021-02-03 (×3): 40 mg via ORAL
  Filled 2021-01-31 (×4): qty 1

## 2021-01-31 MED ORDER — PEG-KCL-NACL-NASULF-NA ASC-C 100 G PO SOLR
0.5000 | Freq: Once | ORAL | Status: DC
  Filled 2021-01-31: qty 1

## 2021-01-31 MED ORDER — ALBUTEROL SULFATE (2.5 MG/3ML) 0.083% IN NEBU: 2.5000 mg | INHALATION_SOLUTION | Freq: Four times a day (QID) | RESPIRATORY_TRACT | Status: DC | PRN

## 2021-01-31 MED ORDER — PROPOFOL 500 MG/50ML IV EMUL
INTRAVENOUS | Status: DC | PRN
  Administered 2021-01-31: 200 ug/kg/min via INTRAVENOUS

## 2021-01-31 MED ORDER — PHENYLEPHRINE 40 MCG/ML (10ML) SYRINGE FOR IV PUSH (FOR BLOOD PRESSURE SUPPORT)
PREFILLED_SYRINGE | INTRAVENOUS | Status: DC | PRN
  Administered 2021-01-31: 80 ug via INTRAVENOUS

## 2021-01-31 MED ORDER — LIDOCAINE 2% (20 MG/ML) 5 ML SYRINGE
INTRAMUSCULAR | Status: DC | PRN
  Administered 2021-01-31: 80 mg via INTRAVENOUS

## 2021-01-31 MED ORDER — ACETAMINOPHEN 325 MG PO TABS: 650.0000 mg | ORAL_TABLET | Freq: Four times a day (QID) | ORAL | Status: DC | PRN

## 2021-01-31 MED ORDER — ONDANSETRON HCL 4 MG/2ML IJ SOLN: 4.0000 mg | Freq: Four times a day (QID) | INTRAMUSCULAR | Status: DC | PRN

## 2021-01-31 SURGICAL SUPPLY — 14 items

## 2021-01-31 NOTE — H&P (Signed)
History and Physical    KERIANA SARSFIELD XTK:[id number] DOB: 08-10-1945 DOA: 01/31/2021  Referring MD/NP/PA: Elmo Putt, MD PCP: [name], [name], MD  Patient coming from: Endoscopy postop holding at [facility]  Chief Complaint: Blood in stool  I have personally briefly reviewed patient's old medical records in [place]   HPI: Jane [name] is a 75 y.o. female with medical history significant of hypertension, paroxysmal atrial fibrillation on Coumadin, asthma, anemia of chronic disease, ESRD hemodialysis M/W/F, history of diverticulitis s/p colectomy in 2017, duodenal ulcer, and duodenal tubulovillous adenomas with prior resection who presents with complaints of blood in stools for almost 1 week and recent INR was elevated at 4.7 on 7/7.  She has been off of Coumadin since that day.  Patient notes that she was having bright red blood per rectum with intermittent diarrhea and reports of some crampy left lower quadrant abdominal discomfort.  Symptoms were initially thought possibly related to hemorrhoids and supratherapeutic INR of 4.6 noted a week ago.  However, due to patient's prior history of tubulovillous adenoma s/p resection complicated by postop bleeding in 2021 a repeat endoscopy was recommended.  Labs performed yesterday revealed hemoglobin 9.2 g/dL and INR 1.4 patient underwent endoscopy.  Patient was started not to have any gross lesions in the esophagus with a 1 cm hiatal hernia, four 3-71mm  semi-sessile polyps without bleeding found in the gastric body, and medium post mucosal ectomy scar found in the second portion of the duodenum with small amount of polypoid tissue present which was sampled for histology.  Following the endoscopy rectal exam under anesthesia was performed with patient noted to have internal and external hemorrhoids with maroon-colored stools present.  Patient was not noted to have concern for diverticulosis on previous colonoscopy in 2021 and therefore question  source of bleeding.  Plan to have colonoscopy tomorrow morning.  TRH was called to admit patient for observation.  Patient reports that she had taken her home blood pressure medications this morning prior to the procedure.  Blood pressures were noted to be as low as 75/21.  Patient denies any fever, chills, nausea, vomiting, lightheadedness, shortness of breath, or chest pain.  Patient last dialyzed on 7/13.  ED Course: As seen above.  Review of Systems  Constitutional:  Negative for fever.  HENT:  Negative for nosebleeds.   Eyes:  Negative for photophobia and pain.  Respiratory:  Negative for cough and shortness of breath.   Cardiovascular:  Positive for leg swelling. Negative for chest pain.  Gastrointestinal:  Positive for abdominal pain and blood in stool.  Genitourinary:  Negative for hematuria.  Musculoskeletal:  Negative for falls.  Skin:  Negative for rash.  Neurological:  Negative for loss of consciousness.  Psychiatric/Behavioral:  Negative for substance abuse. The patient does not have insomnia.    Past Medical History:  Diagnosis Date   Acid reflux    Anemia of chronic disease    Arthritis    Asthma    Atrial fibrillation ([place])    Bilateral carotid bruits    Complication of anesthesia    "hard to wake up, I have sleep apnea" no CPAP   Depression    Diverticulitis    Duodenal ulcer    Dysrhythmia    Afib   ESRD (end stage renal disease) ([place])    MWF [place]   Headache    History of blood transfusion    Hypertension    Malaise and fatigue    Orthostatic  hypotension    Shortness of breath    " when I walk to fast"   Sleep apnea    Syncope    Tubulovillous adenoma of colon     Past Surgical History:  Procedure Laterality Date   A/V FISTULAGRAM Right 10/18/2020   Procedure: A/V FISTULAGRAM;  Surgeon: Marty Heck, MD;  Location: [place] [facility];  Service: Cardiovascular;  Laterality: Right;   AV FISTULA PLACEMENT     BACK SURGERY     Lumbar  fusion L 4 and L 5   BIOPSY  01/09/2020   Procedure: BIOPSY;  Surgeon: Rush Landmark Telford Nab., MD;  Location: [place];  Service: Gastroenterology;;   COLONOSCOPY     ENDOSCOPIC MUCOSAL RESECTION N/A 01/09/2020   Procedure: ENDOSCOPIC MUCOSAL RESECTION;  Surgeon: Irving Copas., MD;  Location: [place];  Service: Gastroenterology;  Laterality: N/A;   ESOPHAGOGASTRODUODENOSCOPY     ESOPHAGOGASTRODUODENOSCOPY (EGD) WITH PROPOFOL N/A 01/09/2020   Procedure: ESOPHAGOGASTRODUODENOSCOPY (EGD) WITH PROPOFOL;  Surgeon: Rush Landmark Telford Nab., MD;  Location: [place];  Service: Gastroenterology;  Laterality: N/A;   ESOPHAGOGASTRODUODENOSCOPY (EGD) WITH PROPOFOL N/A 01/13/2020   Procedure: ESOPHAGOGASTRODUODENOSCOPY (EGD) WITH PROPOFOL;  Surgeon: Irene Shipper, MD;  Location: [place];  Service: Gastroenterology;  Laterality: N/A;   EUS N/A 01/09/2020   Procedure: UPPER ENDOSCOPIC ULTRASOUND (EUS) RADIAL;  Surgeon: Irving Copas., MD;  Location: [place];  Service: Gastroenterology;  Laterality: N/A;   HEMOSTASIS CLIP PLACEMENT  01/09/2020   Procedure: HEMOSTASIS CLIP PLACEMENT;  Surgeon: Irving Copas., MD;  Location: [place];  Service: Gastroenterology;;   HEMOSTASIS CONTROL  01/13/2020   Procedure: HEMOSTASIS CONTROL;  Surgeon: Irene Shipper, MD;  Location: [facility] ENDOSCOPY;  Service: Gastroenterology;;  hemaspray   HOT HEMOSTASIS N/A 01/13/2020   Procedure: HOT HEMOSTASIS (ARGON PLASMA COAGULATION/BICAP);  Surgeon: Irene Shipper, MD;  Location: [place];  Service: Gastroenterology;  Laterality: N/A;   IR GENERIC HISTORICAL  07/09/2016   IR US GUIDE VASC ACCESS RIGHT 07/09/2016 Arne Cleveland, MD [facility]   IR GENERIC HISTORICAL  07/09/2016   IR FLUORO GUIDE CV LINE RIGHT 07/09/2016 Arne Cleveland, MD [facility]   KNEE ARTHROPLASTY Left    LAPAROSCOPIC SIGMOID COLECTOMY N/A 07/11/2016   Procedure: LAPAROSCOPIC SIGMOID COLECTOMY;  Surgeon:  Clovis Riley, MD;  Location: [place];  Service: General;  Laterality: N/A;   MASS EXCISION Right 04/10/2020   Procedure: EXCISION SKIN NODULE RIGHT FOREARM;  Surgeon: Waynetta Sandy, MD;  Location: [place];  Service: Vascular;  Laterality: Right;   SCLEROTHERAPY  01/09/2020   Procedure: Clide Deutscher;  Surgeon: Mansouraty, Telford Nab., MD;  Location: [place];  Service: Gastroenterology;;   Clide Deutscher  01/13/2020   Procedure: Clide Deutscher;  Surgeon: Irene Shipper, MD;  Location: [facility] ENDOSCOPY;  Service: Gastroenterology;;   Lia Foyer LIFTING INJECTION  01/09/2020   Procedure: SUBMUCOSAL LIFTING INJECTION;  Surgeon: Irving Copas., MD;  Location: [place];  Service: Gastroenterology;;   TUBAL LIGATION       reports that she has never smoked. She has never used smokeless tobacco. She reports that she does not drink alcohol and does not use drugs.  Allergies  Allergen Reactions   Latex Rash   Penicillins Other (See Comments)    Yeast infection / Childhood   Sulfa Antibiotics Rash   Tape Other (See Comments)    Plastic, silicone, and paper tape causes bruising and pulls off skin. Cloth tape works fine    Family History  Problem Relation  Age of Onset   Heart failure Mother    Stroke Mother    Other Father    Colon cancer Neg Hx    Liver disease Neg Hx    Esophageal cancer Neg Hx    Stomach cancer Neg Hx    Inflammatory bowel disease Neg Hx    Rectal cancer Neg Hx    Pancreatic cancer Neg Hx     Prior to Admission medications   Medication Sig Start Date End Date Taking? Authorizing Provider  albuterol (PROVENTIL HFA;VENTOLIN HFA) 108 (90 Base) MCG/ACT inhaler Inhale 1 puff into the lungs every 6 (six) hours as needed for wheezing or shortness of breath. Patient taking differently: Inhale 2 puffs into the lungs every 6 (six) hours as needed for wheezing or shortness of breath. 01/26/18  Yes [name], [name], MD  amiodarone (PACERONE) 200 MG tablet Take 1  tablet (200 mg total) by mouth daily. 01/10/21 07/09/21 Yes Adrian Prows, MD  amLODipine (NORVASC) 10 MG tablet Take 1 tablet (10 mg total) by mouth daily. Patient taking differently: Take 10 mg by mouth at bedtime. 01/10/21 01/05/22 Yes Adrian Prows, MD  B Complex-C-Zn-Folic Acid (DIALYVITE 983 WITH ZINC) 0.8 MG TABS Take 1 tablet by mouth daily. 07/19/20  Yes [provider]  carvedilol (COREG) 25 MG tablet Take 1.5 tablets (37.5 mg total) by mouth 2 (two) times daily with a meal. 11/29/20  Yes Adrian Prows, MD  diltiazem (CARDIZEM) 60 MG tablet Take 1 tablet (60 mg total) by mouth 3 (three) times daily as needed (Atrial fibrillation). 11/29/20  Yes Adrian Prows, MD  DULoxetine (CYMBALTA) 20 MG capsule Take 2 capsules (40 mg total) by mouth daily. 10/23/20  Yes [name], [name], MD  ethyl chloride spray Apply 1 application topically every Monday, Wednesday, and Friday with hemodialysis. 12/25/17  Yes [provider]  hydrALAZINE (APRESOLINE) 25 MG tablet Take 1 tablet (25 mg total) by mouth 3 (three) times daily. Patient taking differently: Take 50 mg by mouth 3 (three) times daily. 11/29/20  Yes Adrian Prows, MD  isosorbide dinitrate (ISORDIL) 30 MG tablet Take 1 tablet (30 mg total) by mouth 3 (three) times daily. 11/29/20  Yes Adrian Prows, MD  LINZESS 145 MCG CAPS capsule TAKE 1 CAPSULE BY MOUTH ONCE DAILY BEFORE BREAKFAST Patient taking differently: Take 145 mcg by mouth daily before breakfast. 12/19/20  Yes Mansouraty, Telford Nab., MD  Methoxy PEG-Epoetin Beta (MIRCERA IJ) Mircera 01/02/21 01/01/22 Yes [provider]  omeprazole (PRILOSEC) 40 MG capsule TAKE 1 CAPSULE BY MOUTH TWICE DAILY BEFORE A MEAL Patient taking differently: Take 40 mg by mouth daily. 12/20/20  Yes Mansouraty, Telford Nab., MD  warfarin (COUMADIN) 5 MG tablet USE AS DIRECTED BY COUMADIN CLINIC Patient taking differently: Take 2.5-5 mg by mouth See admin instructions. USE AS DIRECTED BY COUMADIN CLINIC  Take 7.5 mg on  M,W,F and 5mg  all other days 11/09/20  Yes [name], [name], MD    Physical Exam:  Constitutional: Elderly female currently in no acute distress. Vitals:   01/31/21 1005 01/31/21 1010 01/31/21 1015 01/31/21 1020  BP: (!) 115/40 (!) 103/32 (!) 106/35 (!) 109/34  Pulse: 68 66 64 65  Resp: 16 19 12 17   Temp:      TempSrc:      SpO2: 94% 98% 96% 95%  Weight:      Height:       Eyes: PERRL, lids and conjunctivae normal ENMT: Mucous membranes are moist. Posterior pharynx clear of any exudate  or lesions.N  Neck: normal, supple, no masses, no thyromegaly.  No JVD. Respiratory: clear to auscultation bilaterally, no wheezing, no crackles. Normal respiratory effort. No accessory muscle use.  Cardiovascular: Regular rate and rhythm, no murmurs / rubs / gallops. No significant extremity edema. 2+ pedal pulses. No carotid bruits.  Abdomen: no tenderness, no masses palpated. No hepatosplenomegaly. Bowel sounds positive.  Musculoskeletal: no clubbing / cyanosis. No joint deformity upper and lower extremities. Good ROM, no contractures. Normal muscle tone.  Skin: no rashes, lesions, ulcers. No induration Neurologic: CN 2-12 grossly intact. Sensation intact, DTR normal. Strength 5/5 in all 4.  Psychiatric: Normal judgment and insight. Alert and oriented x 3. Normal mood.     Labs on Admission: I have personally reviewed following labs and imaging studies  CBC: Recent Labs  Lab 01/30/21 1400  WBC 5.2  NEUTROABS 3.3  HGB 9.2*  HCT 28.2*  MCV 85.2  PLT 174.9   Basic Metabolic Panel: No results for input(s): NA, K, CL, CO2, GLUCOSE, BUN, CREATININE, CALCIUM, MG, PHOS in the last 168 hours. GFR: CrCl cannot be calculated (Patient's most recent lab result is older than the maximum 21 days allowed.). Liver Function Tests: No results for input(s): AST, ALT, ALKPHOS, BILITOT, PROT, ALBUMIN in the last 168 hours. No results for input(s): LIPASE, AMYLASE in the last 168 hours. No results for  input(s): AMMONIA in the last 168 hours. Coagulation Profile: Recent Labs  Lab 01/24/21 1414 01/31/21 0720  INR 4.7* 1.4*   Cardiac Enzymes: No results for input(s): CKTOTAL, CKMB, CKMBINDEX, TROPONINI in the last 168 hours. BNP (last 3 results) No results for input(s): PROBNP in the last 8760 hours. HbA1C: No results for input(s): HGBA1C in the last 72 hours. CBG: No results for input(s): GLUCAP in the last 168 hours. Lipid Profile: No results for input(s): CHOL, HDL, LDLCALC, TRIG, CHOLHDL, LDLDIRECT in the last 72 hours. Thyroid Function Tests: No results for input(s): TSH, T4TOTAL, FREET4, T3FREE, THYROIDAB in the last 72 hours. Anemia Panel: No results for input(s): VITAMINB12, FOLATE, FERRITIN, TIBC, IRON, RETICCTPCT in the last 72 hours. Urine analysis:    Component Value Date/Time   COLORURINE YELLOW 08/04/2016 0932   APPEARANCEUR HAZY (A) 08/04/2016 0932   LABSPEC 1.009 08/04/2016 0932   PHURINE 6.0 08/04/2016 0932   GLUCOSEU NEGATIVE 08/04/2016 0932   HGBUR NEGATIVE 08/04/2016 0932   BILIRUBINUR NEGATIVE 08/04/2016 0932   KETONESUR NEGATIVE 08/04/2016 0932   PROTEINUR 30 (A) 08/04/2016 0932   UROBILINOGEN 0.2 06/30/2012 2201   NITRITE NEGATIVE 08/04/2016 0932   LEUKOCYTESUR NEGATIVE 08/04/2016 0932   Sepsis Labs: Recent Results (from the past 240 hour(s))  SARS CORONAVIRUS 2 (TAT 6-24 HRS) Nasopharyngeal Nasopharyngeal Swab     Status: None   Collection Time: 01/28/21 11:21 AM   Specimen: Nasopharyngeal Swab  Result Value Ref Range Status   SARS Coronavirus 2 NEGATIVE NEGATIVE Final    Comment: (NOTE) SARS-CoV-2 target nucleic acids are NOT DETECTED.  The SARS-CoV-2 RNA is generally detectable in upper and lower respiratory specimens during the acute phase of infection. Negative results do not preclude SARS-CoV-2 infection, do not rule out co-infections with other pathogens, and should not be used as the sole basis for treatment or other patient  management decisions. Negative results must be combined with clinical observations, patient history, and epidemiological information. The expected result is Negative.  Fact Sheet for Patients: SugarRoll.be  Fact Sheet for Healthcare Providers: https://www.woods-mathews.com/  This test is not yet approved or cleared by the  [place] Architectural technologist and  has been authorized for detection and/or diagnosis of SARS-CoV-2 by FDA under an Print production planner (EUA). This EUA will remain  in effect (meaning this test can be used) for the duration of the COVID-19 declaration under Se ction 564(b)(1) of the Act, 21 U.S.C. section 360bbb-3(b)(1), unless the authorization is terminated or revoked sooner.  Performed at [facility], [place] [address], [place], [place] [address]      Radiological Exams on Admission: No results found.   Assessment/Plan Suspected lower GI bleed(acute) anemia of chronic disease history of tubulovillous adenoma polyp:  Patient reports 1 week bright red blood per rectum.  Hemoglobin 9.2 g/dL with elevated RDW suggesting concern for iron deficiency.  Endoscopic was performed today but did not show any acute signs of bleeding.  She was noted to have internal and external hemorrhoids, but stools were noted to be maroon in color concerning for bleed somewhere else.  Given prior history of tubulovillous adenomas in the past colonoscopy was planned for in a.m. -Admit to a medical telemetry bed -Type and screen for possible need of blood products -Serial monitoring of H&H -N.p.o. after midnight for colonoscopy on 7/15 -Appreciate gastroenterology consultative services, we will follow-up for any further recommendations.  Transient hypotension: Acute.  Patient was noted to be hypotensive down to 75/21 this morning.  She had taken her home blood pressure medicines prior to arrival.  Suspect likely multifactorial given home blood  pressure medications, anesthesia, and continued bleeding. -Bolus of 250 mL of fluid was ordered with improvement in pressures. -Goal maps greater than 65 -Resume home blood pressure medications when medically appropriate  ESRD on HD: Patient normally dialyzes on Monday, Wednesday, Friday.  Last hemodialysis session on 7/13.  She is followed by Dr. Crist Fat in the outpatient setting -Check renal function panel daily -nephrology notified of the patient's admission  Paroxysmal atrial fibrillation on chronic anticoagulation: Patient appears to be in sinus rhythm at this time -Continue amiodarone  Depression -Continue Cymbalta  GERD -Continue pharmacy substitution of Protonix for omeprazole  Morbid obesity: BMI 41.01 kg/m   DVT prophylaxis: SCDs Code Status: full Family Communication: Daughter updated at bedside Disposition Plan: TBD Consults called: Gastroenterology Admission status: observation  Norval Morton MD [facility]   If 7PM-7AM, please contact night-coverage   01/31/2021, 10:29 AM

## 2021-01-31 NOTE — Progress Notes (Addendum)
[place] [facility] BRIEF PROGRESS NOTE  Jane [name] [id number] [birth year]  75 year old ESRD patient on HD MWF at [facility].  Notified of patient being admitted to observation due to possible GIB with plan for colonoscopy tomorrow.  EGD completed today was unremarkable with no bleeding source.  Completed full dialysis yesterday.  Oxygenating well on RA.  Renal panel ordered.  No acute indication for dialysis at this time.  Will continue to monitor and plan for HD tomorrow scheduled around colonoscopy.  If patient determined to need inpatient status will complete full consult.   OP HD orders: [facility] - MWF 3h 7m 95kg 400/500 2K 3.5Ca Hep 2000 Mircera 74mcg q2wks - last 7/13 Calcitriol 0.58mcg qHD  Jen Mow, PA-C [employer]

## 2021-01-31 NOTE — Congregational Nurse Program (Signed)
New admission from endo. Patient is alert and oriented x4. No sign issues.

## 2021-01-31 NOTE — Anesthesia Preprocedure Evaluation (Addendum)
Anesthesia Evaluation  Patient identified by MRN, date of birth, ID band Patient awake    Reviewed: Allergy & Precautions, NPO status , Patient's Chart, lab work & pertinent test results  Airway Mallampati: III  TM Distance: >3 FB Neck ROM: Full    Dental  (+) Teeth Intact, Dental Advisory Given   Pulmonary asthma , sleep apnea ,    breath sounds clear to auscultation       Cardiovascular hypertension, Pt. on medications and Pt. on home beta blockers + dysrhythmias Atrial Fibrillation  Rhythm:Regular Rate:Normal     Neuro/Psych  Headaches, PSYCHIATRIC DISORDERS Depression    GI/Hepatic Neg liver ROS, PUD, GERD  Medicated,  Endo/Other  negative endocrine ROS  Renal/GU ESRF and DialysisRenal disease     Musculoskeletal  (+) Arthritis ,   Abdominal (+) + obese,   Peds  Hematology negative hematology ROS (+)   Anesthesia Other Findings   Reproductive/Obstetrics                            Anesthesia Physical Anesthesia Plan  ASA: 3  Anesthesia Plan: MAC   Post-op Pain Management:    Induction: Intravenous  PONV Risk Score and Plan: 0 and Propofol infusion  Airway Management Planned: Natural Airway and Nasal Cannula  Additional Equipment: None  Intra-op Plan:   Post-operative Plan:   Informed Consent: I have reviewed the patients History and Physical, chart, labs and discussed the procedure including the risks, benefits and alternatives for the proposed anesthesia with the patient or authorized representative who has indicated his/her understanding and acceptance.     Dental advisory given  Plan Discussed with: CRNA  Anesthesia Plan Comments: (Lab Results      Component                Value               Date                      INR                      4.7 (A)             01/24/2021                INR                      2.6                 01/10/2021                INR                       2.8                 12/27/2020             Repeating INR)       Anesthesia Quick Evaluation

## 2021-01-31 NOTE — Progress Notes (Signed)
Patient completed endoscopy with report in epic MyChart and Provation.  As documented in the chart from prior telephone messages the patient has had some bright red blood per rectum over the course of the last week or so.  Starting last Thursday or Friday she began to have episodes of bright red blood per rectum.  Initially the thought and concern was based on her history of having hemorrhoidal disease that this could be hemorrhoidal in nature.  With that being said, the patient came in yesterday for blood work which showed a persistence of anemia though not anything to outside of her baseline. Her endoscopy was completed. At the completion of her endoscopy I performed a rectal exam under anesthesia and I found that the patient has internal and external hemorrhoidal disease but also has maroon-colored stool. Her INR had been checked this morning and was normal at 1.4. The etiology of her maroon stool is unclear and in the setting of near normalization of her INR I am not sure what is causing issues. The patient has a history of prior diverticular disease requiring colectomy in 2017. Last colonoscopy report was reviewed from 2021 and it did not show any evidence of persistence in diverticulosis and the last CT scan from 2022 did not suggest any diverticular disease either.  At this point, the etiology of her GI bleeding is unclear.  Query unseen diverticular disease versus anything else that could be going on in the colon.  She needs repeat colonoscopic evaluation.  We will ask medicine to admit her for observation overnight and plan for colonoscopy tomorrow to better define the etiology of her bleeding.  I have discussed this case with Dr. Carlean Purl the inpatient [facility] MD.  I have discussed this case with Dr. Lorin Mercy from medicine service.  I will order the patient's preparation for colonoscopy tomorrow and place the order set and on clear liquids today.  The patient will remain with [place] until a  bed becomes available hopefully soon.  Justice Britain, MD [facility] Office # [phone]

## 2021-01-31 NOTE — Telephone Encounter (Signed)
Received a phone call from Facility: Dr. Rush Landmark, GI   Patient with h/o afib on Coumadin; ESRD on HD; HTN; OSA; and prior duodenal adenoma presenting to GI. INR 2.4.  Presented for outpatient endoscopy f/u after polyp.  BRBPR 1 week ago, called cards and stopped Coumadin at that time.  Deep maroon stools, persistent.  EGD today, unremarkable.  Needs observation, has h/o diverticular issue with colectomy but last C-scope ok.  Needs colonoscopy tomorrow.  Needs observation and monitoring.  CBC yesterday was ok, did not take BP meds.  Needs to come in today, still bleeding so will monitor overnight prior to procedure.  Can go to Med Surg, BP 110/50 and otherwise stable post-procedure.  In endoscopy now. Plan of care: Med surg observation, for C-scope tomorrow.  Monitor Hgb.  GI will follow.   Nursing staff, Please call the [facility] number at the top of Amion at the time of the patient's arrival so that the patient can be paged to the admitting physician.   Carlyon Shadow, M.D. [facility]

## 2021-01-31 NOTE — Anesthesia Postprocedure Evaluation (Signed)
Anesthesia Post Note  Patient: Jane [name]  Procedure(s) Performed: ESOPHAGOGASTRODUODENOSCOPY (EGD) WITH PROPOFOL ENDOSCOPIC MUCOSAL RESECTION BIOPSY HEMOSTASIS CLIP PLACEMENT     Patient location during evaluation: PACU Anesthesia Type: MAC Level of consciousness: awake and alert Pain management: pain level controlled Vital Signs Assessment: post-procedure vital signs reviewed and stable Respiratory status: spontaneous breathing, nonlabored ventilation, respiratory function stable and patient connected to nasal cannula oxygen Cardiovascular status: stable and blood pressure returned to baseline Postop Assessment: no apparent nausea or vomiting Anesthetic complications: no   No notable events documented.  Last Vitals:  Vitals:   01/31/21 1040 01/31/21 1050  BP: (!) 124/37 (!) 110/41  Pulse: 66 66  Resp: 20 17  Temp:    SpO2: 92% 97%    Last Pain:  Vitals:   01/31/21 1020  TempSrc:   PainSc: 0-No pain                 Effie Berkshire

## 2021-01-31 NOTE — Op Note (Addendum)
[facility] Patient Name: Jane [name] Procedure Date : 01/31/2021 MRN: [id number] Attending MD: Justice Britain , MD Date of Birth: 1946/06/19 CSN: [id number] Age: 75 Admit Type: Outpatient Procedure:                Upper GI endoscopy Indications:              Hematochezia (noted in least week) - patient was                            supratherapeutic in INR 4.6 1 week ago, Polyps in                            the duodenum, Follow-up of polyps in the duodenum                            (TVA s/p resection in 2021 with bleeding                            post-intervention ) - follow up today Providers:                Justice Britain, MD, Jeanella Cara, RN,                            Cletis Athens, Technician, Barrett Henle, CRNA Referring MD:             Lajuan Lines. Pyrtle, MD Medicines:                Monitored Anesthesia Care Complications:            No immediate complications. Estimated Blood Loss:     Estimated blood loss was minimal. Procedure:                Pre-Anesthesia Assessment:                           - Prior to the procedure, a History and Physical                            was performed, and patient medications and                            allergies were reviewed. The patient's tolerance of                            previous anesthesia was also reviewed. The risks                            and benefits of the procedure and the sedation                            options and risks were discussed with the patient.                            All questions were answered, and informed consent  was obtained. Prior Anticoagulants: The patient has                            taken Coumadin (warfarin), last dose was 7 days                            prior to procedure. ASA Grade Assessment: III - A                            patient with severe systemic disease. After                            reviewing the risks and  benefits, the patient was                            deemed in satisfactory condition to undergo the                            procedure.                           After obtaining informed consent, the endoscope was                            passed under direct vision. Throughout the                            procedure, the patient's blood pressure, pulse, and                            oxygen saturations were monitored continuously. The                            [id number] ([id number]) Olympus therapeutic                            gastroscope was introduced through the mouth, and                            advanced to the second part of duodenum. The                            [id number] ([id number]) Olympus duodenoscope was                            introduced through the mouth, and advanced to the                            second part of duodenum. The upper GI endoscopy was                            accomplished without difficulty. The patient  tolerated the procedure. Scope In: Scope Out: Findings:      No gross lesions were noted in the entire esophagus.      The Z-line was regular and was found 37 cm from the incisors.      A 1 cm hiatal hernia was present.      Four 3 to 7 mm semi-sessile polyps with no bleeding and no stigmata of       recent bleeding were found in the gastric body - inflammatory in       appearance.      Localized darkened mucosal changes characterized by altered texture were       found in the gastric antrum.      No other gross lesions were noted in the entire examined stomach.      A medium post mucosectomy scar was found in the second portion of the       duodenum. There was a small amount of polypoid tissue present. This was       sampled with a cold snare for histology. To prevent bleeding       post-intervention, two hemostatic clips were successfully placed (MR       conditional). There was no bleeding at the end of the  procedure.      Mucosal changes characterized by prominence were found at the major       papilla. Biopsies were taken with a cold forceps for histology.      No other visualized gross lesions were noted in the entire examined       duodenum. Impression:               - No gross lesions in esophagus. Z-line regular, 37                            cm from the incisors.                           - 1 cm hiatal hernia.                           - Four gastric polyps - inflammatory in appearance.                           - Darkened texture changed mucosa in the antrum.                           - No other gross lesions in the stomach.                           - Duodenal scar in D2 from previous TVA resection.                            Some polypoid tissue present - query recurrence vs                            clip artifact. Sampled off via snare. Clips (MR                            conditional) were placed to close the region  and                            decrease risk of bleeding.                           - Major papilla is prominent. Biopsied to rule out                            adenomatous change.                           - No other visualized gross lesions in the entire                            examined duodenum.                           DRE                           - I performed a DRE while under sedation. Patient                            has some internal/external hemorrhoids. However,                            her stool is marroon. Reviewed previous colonoscopy                            but no overt diverticulosis noted and last                            colonoscopy within 1-year. I am concerned whether                            this was bleeding due to her being supratherapeutic                            last week but would expect with INR being                            normalized at this time we shouldn't see the stool                            changes at this  time. Recommendation:           - The patient will be observed post-procedure,                            until all discharge criteria are met.                           - Will discuss with patient/family consideration of  monitoring of Observation as Inpatient and                            potential Colonoscopy/Flexible sigmoidoscopy for                            persistence of bleeding tomorrow.                           - Hold Coumadin for now pending possible other                            procedures.                           - Observe patient's clinical course.                           - Increase PPI to 40 mg twice daily for 1 month and                            then may decrease back to once daily thereafter.                           - Trend Hgb/Hct.                           - Await pathology results.                           - Depending on the results, if no evidence of                            recurrence in scar site samples and no ampullary                            adenoma, then consider repeat EGD in 2-years. If                            any evidence of recurrence in scar site samples                            then would repeat EGD in 1-year. If ampullary                            adenoma is found, then will need to discuss in                            clinic endoscopic ampullectomy attempt.                           - The findings and recommendations were discussed                            with the patient.                           -  The findings and recommendations were discussed                            with the patient's family. Procedure Code(s):        --- Professional ---                           [phone], Esophagogastroduodenoscopy, flexible,                            transoral; with removal of tumor(s), polyp(s), or                            other lesion(s) by snare technique Diagnosis Code(s):        --- Professional  ---                           K44.9, Diaphragmatic hernia without obstruction or                            gangrene                           K31.7, Polyp of stomach and duodenum                           K31.89, Other diseases of stomach and duodenum CPT copyright 2019 [employer]. All rights reserved. The codes documented in this report are preliminary and upon coder review may  be revised to meet current compliance requirements. Justice Britain, MD 01/31/2021 9:52:56 AM Number of Addenda: 0

## 2021-01-31 NOTE — H&P (View-Only) (Signed)
Patient completed endoscopy with report in epic MyChart and Provation.  As documented in the chart from prior telephone messages the patient has had some bright red blood per rectum over the course of the last week or so.  Starting last Thursday or Friday she began to have episodes of bright red blood per rectum.  Initially the thought and concern was based on her history of having hemorrhoidal disease that this could be hemorrhoidal in nature.  With that being said, the patient came in yesterday for blood work which showed a persistence of anemia though not anything to outside of her baseline. Her endoscopy was completed. At the completion of her endoscopy I performed a rectal exam under anesthesia and I found that the patient has internal and external hemorrhoidal disease but also has maroon-colored stool. Her INR had been checked this morning and was normal at 1.4. The etiology of her maroon stool is unclear and in the setting of near normalization of her INR I am not sure what is causing issues. The patient has a history of prior diverticular disease requiring colectomy in 2017. Last colonoscopy report was reviewed from 2021 and it did not show any evidence of persistence in diverticulosis and the last CT scan from 2022 did not suggest any diverticular disease either.  At this point, the etiology of her GI bleeding is unclear.  Query unseen diverticular disease versus anything else that could be going on in the colon.  She needs repeat colonoscopic evaluation.  We will ask medicine to admit her for observation overnight and plan for colonoscopy tomorrow to better define the etiology of her bleeding.  I have discussed this case with Dr. Carlean Purl the inpatient [facility] MD.  I have discussed this case with Dr. Lorin Mercy from medicine service.  I will order the patient's preparation for colonoscopy tomorrow and place the order set and on clear liquids today.  The patient will remain with [place] until a  bed becomes available hopefully soon.  Justice Britain, MD [facility] Office # [phone]

## 2021-01-31 NOTE — H&P (Signed)
GASTROENTEROLOGY PROCEDURE H&P NOTE   Primary Care Physician: [name], [name], MD  HPI: Jane [name] is a 75 y.o. female who presents for EGD for follow up of prior Duodenal Adenoma and potential need for further EMR.  Past Medical History:  Diagnosis Date   Acid reflux    Anemia of chronic disease    Arthritis    Asthma    Atrial fibrillation ([place])    Bilateral carotid bruits    Complication of anesthesia    "hard to wake up, I have sleep apnea" no CPAP   Depression    Diverticulitis    Duodenal ulcer    Dysrhythmia    Afib   ESRD (end stage renal disease) ([place])    MWF [place]   Headache    History of blood transfusion    Hypertension    Malaise and fatigue    Orthostatic hypotension    Shortness of breath    " when I walk to fast"   Sleep apnea    Syncope    Tubulovillous adenoma of colon    Past Surgical History:  Procedure Laterality Date   A/V FISTULAGRAM Right 10/18/2020   Procedure: A/V FISTULAGRAM;  Surgeon: Marty Heck, MD;  Location: [facility];  Service: Cardiovascular;  Laterality: Right;   AV FISTULA PLACEMENT     BACK SURGERY     Lumbar fusion L 4 and L 5   BIOPSY  01/09/2020   Procedure: BIOPSY;  Surgeon: Rush Landmark Telford Nab., MD;  Location: [facility];  Service: Gastroenterology;;   COLONOSCOPY     ENDOSCOPIC MUCOSAL RESECTION N/A 01/09/2020   Procedure: ENDOSCOPIC MUCOSAL RESECTION;  Surgeon: Irving Copas., MD;  Location: [facility];  Service: Gastroenterology;  Laterality: N/A;   ESOPHAGOGASTRODUODENOSCOPY     ESOPHAGOGASTRODUODENOSCOPY (EGD) WITH PROPOFOL N/A 01/09/2020   Procedure: ESOPHAGOGASTRODUODENOSCOPY (EGD) WITH PROPOFOL;  Surgeon: Rush Landmark Telford Nab., MD;  Location: [facility];  Service: Gastroenterology;  Laterality: N/A;   ESOPHAGOGASTRODUODENOSCOPY (EGD) WITH PROPOFOL N/A 01/13/2020   Procedure: ESOPHAGOGASTRODUODENOSCOPY (EGD) WITH PROPOFOL;  Surgeon: Irene Shipper, MD;  Location: [facility];  Service: Gastroenterology;  Laterality: N/A;   EUS N/A 01/09/2020   Procedure: UPPER ENDOSCOPIC ULTRASOUND (EUS) RADIAL;  Surgeon: Irving Copas., MD;  Location: [place] [facility];  Service: Gastroenterology;  Laterality: N/A;   HEMOSTASIS CLIP PLACEMENT  01/09/2020   Procedure: HEMOSTASIS CLIP PLACEMENT;  Surgeon: Irving Copas., MD;  Location: [place];  Service: Gastroenterology;;   HEMOSTASIS CONTROL  01/13/2020   Procedure: HEMOSTASIS CONTROL;  Surgeon: Irene Shipper, MD;  Location: [facility];  Service: Gastroenterology;;  hemaspray   HOT HEMOSTASIS N/A 01/13/2020   Procedure: HOT HEMOSTASIS (ARGON PLASMA COAGULATION/BICAP);  Surgeon: Irene Shipper, MD;  Location: [facility];  Service: Gastroenterology;  Laterality: N/A;   IR GENERIC HISTORICAL  07/09/2016   IR US GUIDE VASC ACCESS RIGHT 07/09/2016 Arne Cleveland, MD [facility]   IR GENERIC HISTORICAL  07/09/2016   IR FLUORO GUIDE CV LINE RIGHT 07/09/2016 Arne Cleveland, MD [facility]   KNEE ARTHROPLASTY Left    LAPAROSCOPIC SIGMOID COLECTOMY N/A 07/11/2016   Procedure: LAPAROSCOPIC SIGMOID COLECTOMY;  Surgeon: Clovis Riley, MD;  Location: [place];  Service: General;  Laterality: N/A;   MASS EXCISION Right 04/10/2020   Procedure: EXCISION SKIN NODULE RIGHT FOREARM;  Surgeon: Waynetta Sandy, MD;  Location: [place];  Service: Vascular;  Laterality: Right;   SCLEROTHERAPY  01/09/2020   Procedure: SCLEROTHERAPY;  Surgeon: Irving Copas., MD;  Location: [place];  Service: Gastroenterology;;   Clide Deutscher  01/13/2020   Procedure: Clide Deutscher;  Surgeon: Irene Shipper, MD;  Location: [facility];  Service: Gastroenterology;;   Maryagnes Amos INJECTION  01/09/2020   Procedure: SUBMUCOSAL LIFTING INJECTION;  Surgeon: Irving Copas., MD;  Location: [place];  Service: Gastroenterology;;   TUBAL LIGATION     No current facility-administered medications for this  encounter.   Allergies  Allergen Reactions   Latex Rash   Penicillins Other (See Comments)    Yeast infection / Childhood   Sulfa Antibiotics Rash   Tape Other (See Comments)    Plastic, silicone, and paper tape causes bruising and pulls off skin. Cloth tape works fine   Family History  Problem Relation Age of Onset   Heart failure Mother    Stroke Mother    Other Father    Colon cancer Neg Hx    Liver disease Neg Hx    Esophageal cancer Neg Hx    Stomach cancer Neg Hx    Inflammatory bowel disease Neg Hx    Rectal cancer Neg Hx    Pancreatic cancer Neg Hx    Social History   Socioeconomic History   Marital status: Divorced    Spouse name: Not on file   Number of children: 4   Years of education: 14   Highest education level: Futures trader degree: occupational, Hotel manager, or vocational program  Occupational History   Occupation: Retired  Tobacco Use   Smoking status: Never   Smokeless tobacco: Never  Vaping Use   Vaping Use: Never used  Substance and Sexual Activity   Alcohol use: No   Drug use: No   Sexual activity: Never  Other Topics Concern   Not on file  Social History Narrative   HH 1   Divorced   Outpatient dialysis Mon, Wed, Fri   4 children: 1 daughter locally is an Designer, multimedia and 3 sons in [place] Determinants of Health   Financial Resource Strain: Low Risk    Difficulty of Paying Living Expenses: Not hard at all  Food Insecurity: No Food Insecurity   Worried About Charity fundraiser in the Last Year: Never true   Arboriculturist in the Last Year: Never true  Transportation Needs: No Transportation Needs   Lack of Transportation (Medical): No   Lack of Transportation (Non-Medical): No  Physical Activity: Inactive   Days of Exercise per Week: 0 days   Minutes of Exercise per Session: 0 min  Stress: Stress Concern Present   Feeling of Stress : To some extent  Social Connections: Moderately Isolated   Frequency of Communication with  Friends and Family: More than three times a week   Frequency of Social Gatherings with Friends and Family: Never   Attends Religious Services: More than 4 times per year   Active Member of [employer] or Organizations: No   Attends Archivist Meetings: Never   Marital Status: Divorced  Human resources officer Violence: Not At Risk   Fear of Current or Ex-Partner: No   Emotionally Abused: No   Physically Abused: No   Sexually Abused: No    Physical Exam: Vital signs in last 24 hours: Temp:  [97 F (36.1 C)] 97 F (36.1 C) (07/14 0700) Pulse Rate:  [61] 61 (07/14 0700) Resp:  [13] 13 (07/14 0700) BP: (120)/(49) 120/49 (07/14 0700) SpO2:  [97 %] 97 % (07/14 0700) Weight:  [  95.3 kg] 95.3 kg (07/14 0700)   GEN: NAD EYE: Sclerae anicteric ENT: MMM CV: Non-tachycardic GI: Soft, NT/ND NEURO:  Alert & Oriented x 3  Lab Results: Recent Labs    01/30/21 1400  WBC 5.2  HGB 9.2*  HCT 28.2*  PLT 189.0   BMET No results for input(s): NA, K, CL, CO2, GLUCOSE, BUN, CREATININE, CALCIUM in the last 72 hours. LFT No results for input(s): PROT, ALBUMIN, AST, ALT, ALKPHOS, BILITOT, BILIDIR, IBILI in the last 72 hours. PT/INR No results for input(s): LABPROT, INR in the last 72 hours.   Impression / Plan: This is a 75 y.o.femalewho presents for EGD for follow up of prior Duodenal Adenoma and potential need for further EMR.  The risks and benefits of endoscopic evaluation were discussed with the patient; these include but are not limited to the risk of perforation, infection, bleeding, missed lesions, lack of diagnosis, severe illness requiring hospitalization, as well as anesthesia and sedation related illnesses.  The patient is agreeable to proceed.   Patient is experiencing persistent issues in regards to BRBPR.  She had an INR of 4.7 1 week ago and has been off Coumadin since, but still having issues.  We will need to recheck INR to ensure it is less than 2.0 to perform any potential  resection safely.  If it is greater than 2.0, I can perform an EGD to evaluate and see if any tissue has returned but would not be able to safely remove tissue.  Will do a rectal exam at time of procedure and if hemorrhoidal disease is noted, then will recommend Anusol, but if issues persist then will need stool studies under auspices of her primary GI.   Justice Britain, MD [facility] Office # [phone]

## 2021-01-31 NOTE — Transfer of Care (Signed)
Immediate Anesthesia Transfer of Care Note  Patient: Jane [name]  Procedure(s) Performed: ESOPHAGOGASTRODUODENOSCOPY (EGD) WITH PROPOFOL ENDOSCOPIC MUCOSAL RESECTION BIOPSY HEMOSTASIS CLIP PLACEMENT  Patient Location: Endoscopy Unit  Anesthesia Type:MAC  Level of Consciousness: awake, alert , oriented and patient cooperative  Airway & Oxygen Therapy: Patient Spontanous Breathing and Patient connected to nasal cannula oxygen  Post-op Assessment: Report given to RN, Post -op Vital signs reviewed and stable and Patient moving all extremities  Post vital signs: Reviewed and stable  Last Vitals:  Vitals Value Taken Time  BP 86/22 01/31/21 0948  Temp 36.3 C 01/31/21 0945  Pulse 67 01/31/21 0949  Resp 22 01/31/21 0949  SpO2 98 % 01/31/21 0949  Vitals shown include unvalidated device data.  Last Pain:  Vitals:   01/31/21 0945  TempSrc: Temporal  PainSc: 0-No pain      Patients Stated Pain Goal: 0 (43/[id number]/57 [id number])  Complications: No notable events documented.

## 2021-02-01 ENCOUNTER — Encounter: Payer: Self-pay | Admitting: Gastroenterology

## 2021-02-01 ENCOUNTER — Encounter (HOSPITAL_COMMUNITY)
Admission: RE | Disposition: A | Discharge status: Home / Self Care | Payer: Self-pay | Source: Home / Self Care | Attending: Internal Medicine

## 2021-02-01 ENCOUNTER — Encounter (HOSPITAL_COMMUNITY): Payer: Self-pay | Admitting: Internal Medicine

## 2021-02-01 ENCOUNTER — Inpatient Hospital Stay (HOSPITAL_COMMUNITY): Payer: Medicare Other | Admitting: Anesthesiology

## 2021-02-01 DIAGNOSIS — K5521 Angiodysplasia of colon with hemorrhage: Secondary | ICD-10-CM | POA: Diagnosis present

## 2021-02-01 DIAGNOSIS — D6832 Hemorrhagic disorder due to extrinsic circulating anticoagulants: Secondary | ICD-10-CM | POA: Diagnosis present

## 2021-02-01 DIAGNOSIS — N186 End stage renal disease: Secondary | ICD-10-CM | POA: Diagnosis present

## 2021-02-01 DIAGNOSIS — F331 Major depressive disorder, recurrent, moderate: Secondary | ICD-10-CM | POA: Diagnosis present

## 2021-02-01 DIAGNOSIS — I480 Paroxysmal atrial fibrillation: Secondary | ICD-10-CM | POA: Diagnosis present

## 2021-02-01 DIAGNOSIS — Z91040 Latex allergy status: Secondary | ICD-10-CM | POA: Diagnosis not present

## 2021-02-01 DIAGNOSIS — Z9049 Acquired absence of other specified parts of digestive tract: Secondary | ICD-10-CM | POA: Diagnosis not present

## 2021-02-01 DIAGNOSIS — K922 Gastrointestinal hemorrhage, unspecified: Secondary | ICD-10-CM

## 2021-02-01 DIAGNOSIS — Z6841 Body Mass Index (BMI) 40.0 and over, adult: Secondary | ICD-10-CM | POA: Diagnosis not present

## 2021-02-01 DIAGNOSIS — K264 Chronic or unspecified duodenal ulcer with hemorrhage: Secondary | ICD-10-CM | POA: Diagnosis present

## 2021-02-01 DIAGNOSIS — D631 Anemia in chronic kidney disease: Secondary | ICD-10-CM | POA: Diagnosis present

## 2021-02-01 DIAGNOSIS — K317 Polyp of stomach and duodenum: Secondary | ICD-10-CM | POA: Diagnosis present

## 2021-02-01 DIAGNOSIS — Z880 Allergy status to penicillin: Secondary | ICD-10-CM | POA: Diagnosis not present

## 2021-02-01 DIAGNOSIS — K648 Other hemorrhoids: Secondary | ICD-10-CM | POA: Diagnosis not present

## 2021-02-01 DIAGNOSIS — Z992 Dependence on renal dialysis: Secondary | ICD-10-CM | POA: Diagnosis not present

## 2021-02-01 DIAGNOSIS — D509 Iron deficiency anemia, unspecified: Secondary | ICD-10-CM | POA: Diagnosis not present

## 2021-02-01 DIAGNOSIS — I120 Hypertensive chronic kidney disease with stage 5 chronic kidney disease or end stage renal disease: Secondary | ICD-10-CM | POA: Diagnosis present

## 2021-02-01 DIAGNOSIS — K644 Residual hemorrhoidal skin tags: Secondary | ICD-10-CM | POA: Diagnosis present

## 2021-02-01 DIAGNOSIS — Z882 Allergy status to sulfonamides status: Secondary | ICD-10-CM | POA: Diagnosis not present

## 2021-02-01 DIAGNOSIS — N250 Renal osteodystrophy: Secondary | ICD-10-CM | POA: Diagnosis not present

## 2021-02-01 DIAGNOSIS — Z9109 Other allergy status, other than to drugs and biological substances: Secondary | ICD-10-CM | POA: Diagnosis not present

## 2021-02-01 DIAGNOSIS — Z7901 Long term (current) use of anticoagulants: Secondary | ICD-10-CM

## 2021-02-01 DIAGNOSIS — J45909 Unspecified asthma, uncomplicated: Secondary | ICD-10-CM | POA: Diagnosis present

## 2021-02-01 DIAGNOSIS — Z20822 Contact with and (suspected) exposure to covid-19: Secondary | ICD-10-CM | POA: Diagnosis present

## 2021-02-01 DIAGNOSIS — K219 Gastro-esophageal reflux disease without esophagitis: Secondary | ICD-10-CM | POA: Diagnosis present

## 2021-02-01 DIAGNOSIS — K921 Melena: Secondary | ICD-10-CM | POA: Diagnosis present

## 2021-02-01 DIAGNOSIS — K5909 Other constipation: Secondary | ICD-10-CM | POA: Diagnosis not present

## 2021-02-01 DIAGNOSIS — Z91048 Other nonmedicinal substance allergy status: Secondary | ICD-10-CM | POA: Diagnosis not present

## 2021-02-01 HISTORY — PX: COLONOSCOPY: SHX5424

## 2021-02-01 HISTORY — PX: HOT HEMOSTASIS: SHX5433

## 2021-02-01 LAB — SURGICAL PATHOLOGY

## 2021-02-01 LAB — RENAL FUNCTION PANEL
Albumin: 3 g/dL — ABNORMAL LOW (ref 3.5–5.0)
Anion gap: 7 (ref 5–15)
BUN: 29 mg/dL — ABNORMAL HIGH (ref 8–23)
CO2: 30 mmol/L (ref 22–32)
Calcium: 8.9 mg/dL (ref 8.9–10.3)
Chloride: 99 mmol/L (ref 98–111)
Creatinine, Ser: 6.79 mg/dL — ABNORMAL HIGH (ref 0.44–1.00)
GFR, Estimated: 6 mL/min — ABNORMAL LOW (ref >60)
Glucose, Bld: 85 mg/dL (ref 70–99)
Phosphorus: 4.3 mg/dL (ref 2.5–4.6)
Potassium: 4.4 mmol/L (ref 3.5–5.1)
Sodium: 136 mmol/L (ref 135–145)

## 2021-02-01 LAB — CBC
HCT: 27.5 % — ABNORMAL LOW (ref 36.0–46.0)
Hemoglobin: 8.3 g/dL — ABNORMAL LOW (ref 12.0–15.0)
MCH: 27.6 pg (ref 26.0–34.0)
MCHC: 30.2 g/dL (ref 30.0–36.0)
MCV: 91.4 fL (ref 80.0–100.0)
Platelets: 216 K/uL (ref 150–400)
RBC: 3.01 MIL/uL — ABNORMAL LOW (ref 3.87–5.11)
RDW: 18.4 % — ABNORMAL HIGH (ref 11.5–15.5)
WBC: 5.1 K/uL (ref 4.0–10.5)
nRBC: 0 % (ref 0.0–0.2)

## 2021-02-01 SURGERY — COLONOSCOPY
Anesthesia: Monitor Anesthesia Care

## 2021-02-01 MED ORDER — PEG-KCL-NACL-NASULF-NA ASC-C 100 G PO SOLR
1.0000 | Freq: Once | ORAL | Status: AC
  Administered 2021-02-02: 200 g via ORAL
  Filled 2021-02-01: qty 1

## 2021-02-01 MED ORDER — PROPOFOL 500 MG/50ML IV EMUL
INTRAVENOUS | Status: DC | PRN
  Administered 2021-02-01: 200 ug/kg/min via INTRAVENOUS

## 2021-02-01 MED ORDER — SODIUM CHLORIDE 0.9 % IV SOLN: INTRAVENOUS | Status: DC

## 2021-02-01 NOTE — Consult Note (Addendum)
[facility] Renal Consultation Note  Indication for Consultation:  Management of ESRD/hemodialysis; anemia, hypertension/volume and secondary hyperparathyroidism  HPI: MARIELL [name] is a 75 y.o. female with past medical history including ESRD on dialysis MWF is compliant with outpatient HD, A. fib on amiodarone and Coumadin, diverticulitis, history of colectomy 2019, history of duodenal ulcer, and hypertension.  Admitted with GI bleed complaining of blood in stools for most 1 week recent INR elevated 4.7 on 7/7 has been off Coumadin since that day, noted having bright red blood per rectum with intermittent diarrhea crampy left lower quadrant abdominal discomfort.  Now admitted GI consulting colonoscopy today.   Currently on room ,no complaints, stating "colonoscopy prep took a while to start working for me.  This a.m."  We we will plan on holding heparin with HD later today after colonoscopy.      Past Medical History:  Diagnosis Date   Acid reflux    Anemia of chronic disease    Arthritis    Asthma    Atrial fibrillation ([place])    Bilateral carotid bruits    Complication of anesthesia    "hard to wake up, I have sleep apnea" no CPAP   Depression    Diverticulitis    Duodenal ulcer    Dysrhythmia    Afib   ESRD (end stage renal disease) ([place])    MWF [place]   Headache    History of blood transfusion    Hypertension    Malaise and fatigue    Orthostatic hypotension    Shortness of breath    " when I walk to fast"   Sleep apnea    Syncope    Tubulovillous adenoma of colon     Past Surgical History:  Procedure Laterality Date   A/V FISTULAGRAM Right 10/18/2020   Procedure: A/V FISTULAGRAM;  Surgeon: Marty Heck, MD;  Location: [facility];  Service: Cardiovascular;  Laterality: Right;   AV FISTULA PLACEMENT     BACK SURGERY     Lumbar fusion L 4 and L 5   BIOPSY  01/09/2020   Procedure: BIOPSY;  Surgeon: Rush Landmark Telford Nab., MD;   Location: [place];  Service: Gastroenterology;;   BIOPSY  01/31/2021   Procedure: BIOPSY;  Surgeon: Irving Copas., MD;  Location: [place];  Service: Gastroenterology;;   COLONOSCOPY     ENDOSCOPIC MUCOSAL RESECTION N/A 01/09/2020   Procedure: ENDOSCOPIC MUCOSAL RESECTION;  Surgeon: Irving Copas., MD;  Location: [place];  Service: Gastroenterology;  Laterality: N/A;   ENDOSCOPIC MUCOSAL RESECTION N/A 01/31/2021   Procedure: ENDOSCOPIC MUCOSAL RESECTION;  Surgeon: Rush Landmark Telford Nab., MD;  Location: [place];  Service: Gastroenterology;  Laterality: N/A;   ESOPHAGOGASTRODUODENOSCOPY     ESOPHAGOGASTRODUODENOSCOPY (EGD) WITH PROPOFOL N/A 01/09/2020   Procedure: ESOPHAGOGASTRODUODENOSCOPY (EGD) WITH PROPOFOL;  Surgeon: Rush Landmark Telford Nab., MD;  Location: [place];  Service: Gastroenterology;  Laterality: N/A;   ESOPHAGOGASTRODUODENOSCOPY (EGD) WITH PROPOFOL N/A 01/13/2020   Procedure: ESOPHAGOGASTRODUODENOSCOPY (EGD) WITH PROPOFOL;  Surgeon: Irene Shipper, MD;  Location: [place];  Service: Gastroenterology;  Laterality: N/A;   ESOPHAGOGASTRODUODENOSCOPY (EGD) WITH PROPOFOL N/A 01/31/2021   Procedure: ESOPHAGOGASTRODUODENOSCOPY (EGD) WITH PROPOFOL;  Surgeon: Rush Landmark Telford Nab., MD;  Location: [place];  Service: Gastroenterology;  Laterality: N/A;   EUS N/A 01/09/2020   Procedure: UPPER ENDOSCOPIC ULTRASOUND (EUS) RADIAL;  Surgeon: Irving Copas., MD;  Location: [place];  Service: Gastroenterology;  Laterality: N/A;   HEMOSTASIS CLIP PLACEMENT  01/09/2020  Procedure: HEMOSTASIS CLIP PLACEMENT;  Surgeon: Irving Copas., MD;  Location: [place];  Service: Gastroenterology;;   HEMOSTASIS CLIP PLACEMENT  01/31/2021   Procedure: HEMOSTASIS CLIP PLACEMENT;  Surgeon: Irving Copas., MD;  Location: [place];  Service: Gastroenterology;;   HEMOSTASIS CONTROL  01/13/2020   Procedure: HEMOSTASIS CONTROL;  Surgeon:  Irene Shipper, MD;  [facility];  Service: Gastroenterology;;  hemaspray   HOT HEMOSTASIS N/A 01/13/2020   Procedure: HOT HEMOSTASIS (ARGON PLASMA COAGULATION/BICAP);  Surgeon: Irene Shipper, MD;  Location: [place];  Service: Gastroenterology;  Laterality: N/A;   IR GENERIC HISTORICAL  07/09/2016   IR US GUIDE VASC ACCESS RIGHT 07/09/2016 Arne Cleveland, MD [facility]   IR GENERIC HISTORICAL  07/09/2016   IR FLUORO GUIDE CV LINE RIGHT 07/09/2016 Arne Cleveland, MD [facility]   KNEE ARTHROPLASTY Left    LAPAROSCOPIC SIGMOID COLECTOMY N/A 07/11/2016   Procedure: LAPAROSCOPIC SIGMOID COLECTOMY;  Surgeon: Clovis Riley, MD;  Location: [place];  Service: General;  Laterality: N/A;   MASS EXCISION Right 04/10/2020   Procedure: EXCISION SKIN NODULE RIGHT FOREARM;  Surgeon: Waynetta Sandy, MD;  Location: [place];  Service: Vascular;  Laterality: Right;   SCLEROTHERAPY  01/09/2020   Procedure: Clide Deutscher;  Surgeon: Mansouraty, Telford Nab., MD;  Location: [place];  Service: Gastroenterology;;   Clide Deutscher  01/13/2020   Procedure: Clide Deutscher;  Surgeon: Irene Shipper, MD;  Location: [facility] ENDOSCOPY;  Service: Gastroenterology;;   SUBMUCOSAL LIFTING INJECTION  01/09/2020   Procedure: SUBMUCOSAL LIFTING INJECTION;  Surgeon: Irving Copas., MD;  Location: [facility] ENDOSCOPY;  Service: Gastroenterology;;   TUBAL LIGATION        Family History  Problem Relation Age of Onset   Heart failure Mother    Stroke Mother    Other Father    Colon cancer Neg Hx    Liver disease Neg Hx    Esophageal cancer Neg Hx    Stomach cancer Neg Hx    Inflammatory bowel disease Neg Hx    Rectal cancer Neg Hx    Pancreatic cancer Neg Hx       reports that she has never smoked. She has never used smokeless tobacco. She reports that she does not drink alcohol and does not use drugs.   Allergies  Allergen Reactions   Latex Rash   Penicillins Other (See Comments)    Yeast  infection / Childhood   Sulfa Antibiotics Rash   Tape Other (See Comments)    Plastic, silicone, and paper tape causes bruising and pulls off skin. Cloth tape works fine    Prior to Admission medications   Medication Sig Start Date End Date Taking? Authorizing Provider  albuterol (PROVENTIL HFA;VENTOLIN HFA) 108 (90 Base) MCG/ACT inhaler Inhale 1 puff into the lungs every 6 (six) hours as needed for wheezing or shortness of breath. Patient taking differently: Inhale 2 puffs into the lungs every 6 (six) hours as needed for wheezing or shortness of breath. 01/26/18  Yes [name], [name], MD  amiodarone (PACERONE) 200 MG tablet Take 1 tablet (200 mg total) by mouth daily. 01/10/21 07/09/21 Yes Adrian Prows, MD  amLODipine (NORVASC) 10 MG tablet Take 1 tablet (10 mg total) by mouth daily. Patient taking differently: Take 10 mg by mouth at bedtime. 01/10/21 01/05/22 Yes Adrian Prows, MD  B Complex-C-Zn-Folic Acid (DIALYVITE 353 WITH ZINC) 0.8 MG TABS Take 1 tablet by mouth daily. 07/19/20  Yes [provider]  carvedilol (COREG) 25 MG tablet  Take 1.5 tablets (37.5 mg total) by mouth 2 (two) times daily with a meal. 11/29/20  Yes Adrian Prows, MD  diltiazem (CARDIZEM) 60 MG tablet Take 1 tablet (60 mg total) by mouth 3 (three) times daily as needed (Atrial fibrillation). 11/29/20  Yes Adrian Prows, MD  DULoxetine (CYMBALTA) 20 MG capsule Take 2 capsules (40 mg total) by mouth daily. 10/23/20  Yes [name], [name], MD  ethyl chloride spray Apply 1 application topically every Monday, Wednesday, and Friday with hemodialysis. 12/25/17  Yes [provider]  hydrALAZINE (APRESOLINE) 25 MG tablet Take 1 tablet (25 mg total) by mouth 3 (three) times daily. Patient taking differently: Take 50 mg by mouth 3 (three) times daily. 11/29/20  Yes Adrian Prows, MD  isosorbide dinitrate (ISORDIL) 30 MG tablet Take 1 tablet (30 mg total) by mouth 3 (three) times daily. 11/29/20  Yes Adrian Prows, MD  LINZESS 145 MCG CAPS  capsule TAKE 1 CAPSULE BY MOUTH ONCE DAILY BEFORE BREAKFAST Patient taking differently: Take 145 mcg by mouth daily before breakfast. 12/19/20  Yes Mansouraty, Telford Nab., MD  Methoxy PEG-Epoetin Beta (MIRCERA IJ) Mircera 01/02/21 01/01/22 Yes [provider]  omeprazole (PRILOSEC) 40 MG capsule TAKE 1 CAPSULE BY MOUTH TWICE DAILY BEFORE A MEAL Patient taking differently: Take 40 mg by mouth daily. 12/20/20  Yes Mansouraty, Telford Nab., MD  warfarin (COUMADIN) 5 MG tablet USE AS DIRECTED BY COUMADIN CLINIC Patient taking differently: Take 2.5-5 mg by mouth See admin instructions. USE AS DIRECTED BY COUMADIN CLINIC  Take 7.5 mg on M,W,F and 5mg  all other days 11/09/20  Yes [name], [name], MD    I have reviewed the patient's current medications. Prior to Admission:  Medications Prior to Admission  Medication Sig Dispense Refill Last Dose   albuterol (PROVENTIL HFA;VENTOLIN HFA) 108 (90 Base) MCG/ACT inhaler Inhale 1 puff into the lungs every 6 (six) hours as needed for wheezing or shortness of breath. (Patient taking differently: Inhale 2 puffs into the lungs every 6 (six) hours as needed for wheezing or shortness of breath.) 6.7 g 2 Past Month   amiodarone (PACERONE) 200 MG tablet Take 1 tablet (200 mg total) by mouth daily. 90 tablet 1 01/30/2021   amLODipine (NORVASC) 10 MG tablet Take 1 tablet (10 mg total) by mouth daily. (Patient taking differently: Take 10 mg by mouth at bedtime.) 90 tablet 3 01/31/2021 at 0500   B Complex-C-Zn-Folic Acid (DIALYVITE 774 WITH ZINC) 0.8 MG TABS Take 1 tablet by mouth daily.   01/30/2021   carvedilol (COREG) 25 MG tablet Take 1.5 tablets (37.5 mg total) by mouth 2 (two) times daily with a meal. 240 tablet 3 01/31/2021 at 0500   diltiazem (CARDIZEM) 60 MG tablet Take 1 tablet (60 mg total) by mouth 3 (three) times daily as needed (Atrial fibrillation). 30 tablet 2 01/30/2021   DULoxetine (CYMBALTA) 20 MG capsule Take 2 capsules (40 mg total) by mouth daily. 180  capsule 2 01/30/2021   ethyl chloride spray Apply 1 application topically every Monday, Wednesday, and Friday with hemodialysis.  12 01/30/2021   hydrALAZINE (APRESOLINE) 25 MG tablet Take 1 tablet (25 mg total) by mouth 3 (three) times daily. (Patient taking differently: Take 50 mg by mouth 3 (three) times daily.) 270 tablet 3 01/31/2021 at 0500   isosorbide dinitrate (ISORDIL) 30 MG tablet Take 1 tablet (30 mg total) by mouth 3 (three) times daily. 270 tablet 3 01/31/2021 at 0500   LINZESS 145 MCG CAPS capsule TAKE 1 CAPSULE  BY MOUTH ONCE DAILY BEFORE BREAKFAST (Patient taking differently: Take 145 mcg by mouth daily before breakfast.) 30 capsule 0 Past Month   Methoxy PEG-Epoetin Beta (MIRCERA IJ) Mircera   01/30/2021   omeprazole (PRILOSEC) 40 MG capsule TAKE 1 CAPSULE BY MOUTH TWICE DAILY BEFORE A MEAL (Patient taking differently: Take 40 mg by mouth daily.) 60 capsule 0 01/30/2021   warfarin (COUMADIN) 5 MG tablet USE AS DIRECTED BY COUMADIN CLINIC (Patient taking differently: Take 2.5-5 mg by mouth See admin instructions. USE AS DIRECTED BY COUMADIN CLINIC  Take 7.5 mg on M,W,F and 5mg  all other days) 113 tablet 5 01/24/2021   Scheduled:  amiodarone  200 mg Oral Daily   Chlorhexidine Gluconate Cloth  6 each Topical Q0600   DULoxetine  40 mg Oral Daily   metoCLOPramide (REGLAN) injection  10 mg Intravenous Once   pantoprazole  40 mg Oral Daily   pentafluoroprop-tetrafluoroeth   Topical Q M,W,F-HD   sodium chloride flush  3 mL Intravenous Q12H   Continuous:  sodium chloride     ELF:YBOFBPZWCHENI **OR** acetaminophen, albuterol, ondansetron **OR** ondansetron (ZOFRAN) IV    Results for orders placed or performed during the hospital encounter of 01/31/21 (from the past 48 hour(s))  Protime-INR     Status: Abnormal   Collection Time: 01/31/21  7:20 AM  Result Value Ref Range   Prothrombin Time 17.1 (H) 11.4 - 15.2 seconds   INR 1.4 (H) 0.8 - 1.2    Comment: (NOTE) INR goal varies based on  device and disease states. Performed at [facility], [place] [address] [place], [place] [address]   Type and screen [place]     Status: None   Collection Time: 01/31/21  3:36 PM  Result Value Ref Range   ABO/RH(D) O POS    Antibody Screen NEG    Sample Expiration      02/03/2021,2359 Performed at [facility], [place] [address], [place], [place] [address]   CBC     Status: Abnormal   Collection Time: 01/31/21  3:37 PM  Result Value Ref Range   WBC 4.0 4.0 - 10.5 K/uL   RBC 3.30 (L) 3.87 - 5.11 MIL/uL   Hemoglobin 9.0 (L) 12.0 - 15.0 g/dL   HCT 29.8 (L) 36.0 - 46.0 %   MCV 90.3 80.0 - 100.0 fL   MCH 27.3 26.0 - 34.0 pg   MCHC 30.2 30.0 - 36.0 g/dL   RDW 18.2 (H) 11.5 - 15.5 %   Platelets 194 150 - 400 K/uL   nRBC 0.0 0.0 - 0.2 %    Comment: Performed at [facility] [address], [place], [place] [address]  Renal function panel     Status: Abnormal   Collection Time: 01/31/21  3:37 PM  Result Value Ref Range   Sodium 138 135 - 145 mmol/L   Potassium 4.3 3.5 - 5.1 mmol/L   Chloride 100 98 - 111 mmol/L   CO2 30 22 - 32 mmol/L   Glucose, Bld 98 70 - 99 mg/dL    Comment: Glucose reference range applies only to samples taken after fasting for at least 8 hours.   BUN 25 (H) 8 - 23 mg/dL   Creatinine, Ser 5.99 (H) 0.44 - 1.00 mg/dL   Calcium 9.0 8.9 - 10.3 mg/dL   Phosphorus 3.8 2.5 - 4.6 mg/dL   Albumin 3.1 (L) 3.5 - 5.0 g/dL   GFR, Estimated 7 (L) >60 mL/min  Comment: (NOTE) Calculated using the CKD-EPI Creatinine Equation (2021)    Anion gap 8 5 - 15    Comment: Performed at [facility], [place] [address] [place], [place] [address]  CBC     Status: Abnormal   Collection Time: 02/01/21  2:10 AM  Result Value Ref Range   WBC 5.1 4.0 - 10.5 K/uL   RBC 3.01 (L) 3.87 - 5.11 MIL/uL   Hemoglobin 8.3 (L) 12.0 - 15.0 g/dL   HCT 27.5 (L) 36.0 - 46.0 %   MCV 91.4 80.0 - 100.0 fL   MCH 27.6 26.0 - 34.0 pg   MCHC 30.2 30.0 - 36.0 g/dL    RDW 18.4 (H) 11.5 - 15.5 %   Platelets 216 150 - 400 K/uL   nRBC 0.0 0.0 - 0.2 %    Comment: Performed at [facility] [address], [place], [place] [address]  Renal function panel     Status: Abnormal   Collection Time: 02/01/21  2:10 AM  Result Value Ref Range   Sodium 136 135 - 145 mmol/L   Potassium 4.4 3.5 - 5.1 mmol/L   Chloride 99 98 - 111 mmol/L   CO2 30 22 - 32 mmol/L   Glucose, Bld 85 70 - 99 mg/dL    Comment: Glucose reference range applies only to samples taken after fasting for at least 8 hours.   BUN 29 (H) 8 - 23 mg/dL   Creatinine, Ser 6.79 (H) 0.44 - 1.00 mg/dL   Calcium 8.9 8.9 - 10.3 mg/dL   Phosphorus 4.3 2.5 - 4.6 mg/dL   Albumin 3.0 (L) 3.5 - 5.0 g/dL   GFR, Estimated 6 (L) >60 mL/min    Comment: (NOTE) Calculated using the CKD-EPI Creatinine Equation (2021)    Anion gap 7 5 - 15    Comment: Performed at [facility] [address], [place], [place] [address]    ROS: See HPI   Physical Exam: Vitals:   02/01/21 0508 02/01/21 0823  BP: (!) 137/48 (!) 138/50  Pulse: 78 66  Resp: 18 16  Temp: 98.1 F (36.7 C) 97.8 F (36.6 C)  SpO2: 95% 91%     General: Alert obese female NAD HEENT: [place], PERRLA, nonicteric, MMM Neck: Supple no JVD Heart: RRR, no MRG Lungs: CTA nonlabored breathing Abdomen: Obese bowel sounds normoactive, soft ND NT Extremities: No pedal edema Skin: Warm dry no overt rash lesions or ulcers appreciated Neuro: Alert oriented x3 moves all extremities independently no acute focal deficits appreciated Dialysis Access: Positive bruit right upper arm AV fistula   Dialysis Orders:  OP HD orders: [facility] - MWF 3h 6m 95kg 400/500 2K 3.5Ca Hep 2000 Mircera 67mcg q2wks - last 7/13 Calcitriol 0.45mcg qHD HD access= RU aVF  Assessment/Plan  Acute lower GI bleed -GI was consulted endoscopy 7/14 for gastric polyps inflammatory.,  Internal and external hemorrhoids on DRE for colonoscopy today hemoglobin (11 9 on admission  now 8.3 ) noted on Coumadin for PAF off since admission.  BP transient hypotension on admission a.m. stable  ESRD -HD MWF HD pending today's Hypertension/volume  -had transient hypotension on admission BP 75/21 antihypertensive meds held given fluid with BP stable Anemia of ESRD and GI bleed-Mircera given 75 last on 7/13 (every 2 weeks )continue on hemodialysis Aranesp not due this week Metabolic bone disease -corrected calcium in goal phosphorus 4.3 continue binders 1 on p.o.'s vitamin D p.o. with dialysis History of paroxysmal atrial fibrillation rate  controlled on amiodarone Coumadin on hold currently in normal sinus on exam History of depression Cymbalta per admission team Nutrition -renal failure diet, albumin 3.0 add protein supplement when taking p.o.'s  Ernest Haber, PA-C [place] [phone] 02/01/2021, 10:04 AM

## 2021-02-01 NOTE — Anesthesia Postprocedure Evaluation (Signed)
Anesthesia Post Note  Patient: Jane [name]  Procedure(s) Performed: COLONOSCOPY HOT HEMOSTASIS (ARGON PLASMA COAGULATION/BICAP)     Patient location during evaluation: Endoscopy Anesthesia Type: MAC Level of consciousness: awake and alert Pain management: pain level controlled Vital Signs Assessment: post-procedure vital signs reviewed and stable Respiratory status: spontaneous breathing, nonlabored ventilation, respiratory function stable and patient connected to nasal cannula oxygen Cardiovascular status: stable and blood pressure returned to baseline Postop Assessment: no apparent nausea or vomiting Anesthetic complications: no   No notable events documented.  Last Vitals:  Vitals:   02/01/21 1518 02/01/21 1634  BP: (!) 151/39 (!) 155/49  Pulse: 73 70  Resp: 18 15  Temp:  (!) 36.4 C  SpO2: 95%     Last Pain:  Vitals:   02/01/21 1634  TempSrc: Oral  PainSc:                  Asianna Brundage L Be[place]en [place]

## 2021-02-01 NOTE — Progress Notes (Signed)
Patient is off floor to endo.

## 2021-02-01 NOTE — Interval H&P Note (Signed)
History and Physical Interval Note: Patient completed bowel prep overnight. States the bleeding seems to have stopped, she thinks prep was adequate for an exam today. Her HGb has drifted to 8.3. No cause for bleeding on EGD yesterday, she is scheduled for a colonoscopy today. She denies other complaints at this time, feels at baseline. I examined her without any significant abnormalities - abdomen soft / NT/ND, lungs CTA, heart is regular. I have discussed colonoscopy and anesthesia with her and she wishes to proceed. Further recommendations pending the results. If she has clear pathology on colonoscopy to account for her bleeding we will address it. If the exam is without clear cause we may proceed with capsule endoscopy to clear her small bowel. Patient is in agreement with the plan as outlined, all questions answered.   02/01/2021 2:07 PM  Jane [name]  has presented today for surgery, with the diagnosis of [place] stool/GI Bleeding/History of colectomy for diverticular disease.  The various methods of treatment have been discussed with the patient and family. After consideration of risks, benefits and other options for treatment, the patient has consented to  Procedure(s): COLONOSCOPY (N/A) as a surgical intervention.  The patient's history has been reviewed, patient examined, no change in status, stable for surgery.  I have reviewed the patient's chart and labs.  Questions were answered to the patient's satisfaction.     [place]

## 2021-02-01 NOTE — Anesthesia Procedure Notes (Signed)
Procedure Name: MAC Date/Time: 02/01/2021 2:41 PM Performed by: Babs Bertin, CRNA Pre-anesthesia Checklist: Patient identified, Emergency Drugs available, Patient being monitored, Timeout performed and Suction available Patient Re-evaluated:Patient Re-evaluated prior to induction Oxygen Delivery Method: Nasal cannula

## 2021-02-01 NOTE — Anesthesia Preprocedure Evaluation (Addendum)
Anesthesia Evaluation  Patient identified by MRN, date of birth, ID band Patient awake    Reviewed: Allergy & Precautions, NPO status , Patient's Chart, lab work & pertinent test results  Airway Mallampati: III  TM Distance: >3 FB Neck ROM: Full    Dental no notable dental hx. (+) Teeth Intact, Dental Advisory Given   Pulmonary asthma , sleep apnea ,    Pulmonary exam normal breath sounds clear to auscultation       Cardiovascular hypertension, Pt. on medications Normal cardiovascular exam+ dysrhythmias (on coumadin) Atrial Fibrillation  Rhythm:Regular Rate:Normal  TTE 2022 EF normal, valves ok   Neuro/Psych  Headaches, PSYCHIATRIC DISORDERS    GI/Hepatic Neg liver ROS, PUD, GERD  Controlled,  Endo/Other  negative endocrine ROS  Renal/GU ESRF and DialysisRenal disease (diaylsis MWF, Cr 6.79, K 4.4)  negative genitourinary   Musculoskeletal  (+) Arthritis ,   Abdominal   Peds  Hematology  (+) Blood dyscrasia (Hgb 8.3, INR 1.4), anemia ,   Anesthesia Other Findings 75 y.o. female with medical history significant of hypertension, paroxysmal atrial fibrillation on Coumadin, asthma, anemia of chronic disease, ESRD hemodialysis M/W/F, history of diverticulitis s/p colectomy in 2017, duodenal ulcer, and duodenal tubulovillous adenomas with prior resection who presents with complaints of blood in stools for almost 1 week  Reproductive/Obstetrics                            Anesthesia Physical Anesthesia Plan  ASA: 3  Anesthesia Plan: MAC   Post-op Pain Management:    Induction: Intravenous  PONV Risk Score and Plan: Propofol infusion and Treatment may vary due to age or medical condition  Airway Management Planned: Natural Airway  Additional Equipment:   Intra-op Plan:   Post-operative Plan:   Informed Consent: I have reviewed the patients History and Physical, chart, labs and discussed  the procedure including the risks, benefits and alternatives for the proposed anesthesia with the patient or authorized representative who has indicated his/her understanding and acceptance.     Dental advisory given  Plan Discussed with: CRNA  Anesthesia Plan Comments:         Anesthesia Quick Evaluation

## 2021-02-01 NOTE — Progress Notes (Signed)
[facility] paged that patient has Movie prep scheduled for 1930 and HD tonight question if prep could be started post HD

## 2021-02-01 NOTE — Progress Notes (Signed)
[facility] PROGRESS NOTE    Progress Note  Jane [name]  WCH:[id number] DOB: 1945/08/22 DOA: 01/31/2021 PCP: [name], [name], MD     Brief Narrative:   Jane [name] is an 75 y.o. female past medical history significant for essential hypertension paroxysmal atrial fibrillation on Coumadin, anemia of chronic disease end-stage renal disease on dialysis Monday Wednesday Friday history of diverticulitis, with a history of a colectomy in 2019, duodenal ulcer comes in complaining of bloody stool for 1 week with an INR 4.71-week ago.  Has been off Coumadin since then.  Repeated INR in the ED was less than 2, GI was called endoscopy performed.     Assessment/Plan:   Acute lower GI bleed: GI was consulted endoscopy performed 01/31/2021 showed 4 gastric polyp with inflammatory appearance.  DRE showed internal and external hemorrhoids. Hemoglobin last week was 11 on admission 9, now this morning 8.3. GI to perform colonoscopy 02/01/2021. We will continue to monitor intermittently. Currently on Protonix, she continues to have melanotic stools as per patient.  Transient hypotension: On the morning of admission of 75/21.  Antihypertensive medications were held she was fluid resuscitated her blood pressure is improved. Continue to hold antihypertensive medication.  ESRD on hemodialysis [facility]) Renal has been notified to continue HD on Monday Wednesday and Friday.  Paroxysmal atrial fibrillation (HCC) Rate controlled on amiodarone continue to hold Coumadin at this point in time.  Depression: Continue Cymbalta.  GERD: She is currently on Protonix twice daily   DVT prophylaxis: scd Family Communication:NONE Status is: Observation  The patient will require care spanning > 2 midnights and should be moved to inpatient because: Hemodynamically unstable  Dispo: The patient is from: Home              Anticipated d/c is to: Home              Patient currently is not medically stable to  d/c.   Difficult to place patient No     Code Status:     Code Status Orders  (From admission, onward)           Start     Ordered   01/31/21 1428  Full code  Continuous        01/31/21 1427           Code Status History     Date Active Date Inactive Code Status Order ID Comments User Context   01/13/2020 0608 01/19/2020 1836 Full Code [id number]  Toy Baker, MD Inpatient   06/25/2016 2116 07/18/2016 1902 Full Code [id number]  Edwin Dada, MD Inpatient   07/01/2012 0121 07/02/2012 2228 Full Code [id number]  Georgeanna Harrison, RN Inpatient      Advance Directive Documentation    Flowsheet Row Most Recent Value  Type of Advance Directive Healthcare Power of Attorney  Pre-existing out of facility DNR order (yellow form or pink MOST form) --  "MOST" Form in Place? --         IV Access:   Peripheral IV   Procedures and diagnostic studies:   No results found.   Medical Consultants:   None.   Subjective:    Jane [name] denies any abdominal pain and continues to have melanotic stools as per patient.  Objective:    Vitals:   01/31/21 2140 02/01/21 0053 02/01/21 0507 02/01/21 0508  BP: (!) 148/56 (!) 153/56 (!) 137/48 (!) 137/48  Pulse: 67 66 76 78  Resp: 18 18  18  Temp: (!) 97.4 F (36.3 C) (!) 97.5 F (36.4 C)  98.1 F (36.7 C)  TempSrc: Oral Oral  Oral  SpO2: 97% 90% 91% 95%  Weight:    97.1 kg  Height:       SpO2: 95 % O2 Flow Rate (L/min): 1 L/min   Intake/Output Summary (Last 24 hours) at 02/01/2021 0656 Last data filed at 02/01/2021 0535 Gross per 24 hour  Intake 200 ml  Output 2 ml  Net 198 ml   Filed Weights   01/30/21 1029 01/31/21 0700 02/01/21 0508  Weight: 95.3 kg 95.3 kg 97.1 kg    Exam: General exam: In no acute distress. Respiratory system: Good air movement and clear to auscultation. Cardiovascular system: S1 & S2 heard, RRR. No JVD. Gastrointestinal system: Abdomen is nondistended, soft and  nontender.  Extremities: No pedal edema. Skin: No rashes, lesions or ulcers Data Reviewed:    Labs: Basic Metabolic Panel: Recent Labs  Lab 01/31/21 1537 02/01/21 0210  NA 138 136  K 4.3 4.4  CL 100 99  CO2 30 30  GLUCOSE 98 85  BUN 25* 29*  CREATININE 5.99* 6.79*  CALCIUM 9.0 8.9  PHOS 3.8 4.3   GFR Estimated Creatinine Clearance: 7.4 mL/min (A) (by C-G formula based on SCr of 6.79 mg/dL (H)). Liver Function Tests: Recent Labs  Lab 01/31/21 1537 02/01/21 0210  ALBUMIN 3.1* 3.0*   No results for input(s): LIPASE, AMYLASE in the last 168 hours. No results for input(s): AMMONIA in the last 168 hours. Coagulation profile Recent Labs  Lab 01/31/21 0720  INR 1.4*   COVID-19 Labs  No results for input(s): DDIMER, FERRITIN, LDH, CRP in the last 72 hours.  Lab Results  Component Value Date   SARSCOV2NAA NEGATIVE 01/28/2021   [place] NEGATIVE 11/24/2020   [place] NEGATIVE 10/17/2020   [place] NEGATIVE 04/07/2020    CBC: Recent Labs  Lab 01/30/21 1400 01/31/21 1537 02/01/21 0210  WBC 5.2 4.0 5.1  NEUTROABS 3.3  --   --   HGB 9.2* 9.0* 8.3*  HCT 28.2* 29.8* 27.5*  MCV 85.2 90.3 91.4  PLT 189.0 194 216   Cardiac Enzymes: No results for input(s): CKTOTAL, CKMB, CKMBINDEX, TROPONINI in the last 168 hours. BNP (last 3 results) No results for input(s): PROBNP in the last 8760 hours. CBG: No results for input(s): GLUCAP in the last 168 hours. D-Dimer: No results for input(s): DDIMER in the last 72 hours. Hgb A1c: No results for input(s): HGBA1C in the last 72 hours. Lipid Profile: No results for input(s): CHOL, HDL, LDLCALC, TRIG, CHOLHDL, LDLDIRECT in the last 72 hours. Thyroid function studies: No results for input(s): TSH, T4TOTAL, T3FREE, THYROIDAB in the last 72 hours.  Invalid input(s): FREET3 Anemia work up: No results for input(s): VITAMINB12, FOLATE, FERRITIN, TIBC, IRON, RETICCTPCT in the last 72 hours. Sepsis Labs: Recent Labs   Lab 01/30/21 1400 01/31/21 1537 02/01/21 0210  WBC 5.2 4.0 5.1   Microbiology Recent Results (from the past 240 hour(s))  SARS CORONAVIRUS 2 (TAT 6-24 HRS) Nasopharyngeal Nasopharyngeal Swab     Status: None   Collection Time: 01/28/21 11:21 AM   Specimen: Nasopharyngeal Swab  Result Value Ref Range Status   SARS Coronavirus 2 NEGATIVE NEGATIVE Final    Comment: (NOTE) SARS-CoV-2 target nucleic acids are NOT DETECTED.  The SARS-CoV-2 RNA is generally detectable in upper and lower respiratory specimens during the acute phase of infection. Negative results do not preclude SARS-CoV-2 infection, do not rule out co-infections with  other pathogens, and should not be used as the sole basis for treatment or other patient management decisions. Negative results must be combined with clinical observations, patient history, and epidemiological information. The expected result is Negative.  Fact Sheet for Patients: SugarRoll.be  Fact Sheet for Healthcare Providers: https://www.woods-mathews.com/  This test is not yet approved or cleared by the [place] FDA and  has been authorized for detection and/or diagnosis of SARS-CoV-2 by FDA under an Emergency Use Authorization (EUA). This EUA will remain  in effect (meaning this test can be used) for the duration of the COVID-19 declaration under Se ction 564(b)(1) of the Act, 21 U.S.C. section 360bbb-3(b)(1), unless the authorization is terminated or revoked sooner.  Performed at [facility], [place] [address] [place], [place] [address]      Medications:    amiodarone  200 mg Oral Daily   Chlorhexidine Gluconate Cloth  6 each Topical Q0600   DULoxetine  40 mg Oral Daily   metoCLOPramide (REGLAN) injection  10 mg Intravenous Once   pantoprazole  40 mg Oral Daily   pentafluoroprop-tetrafluoroeth   Topical Q M,W,F-HD   sodium chloride flush  3 mL Intravenous Q12H   Continuous  Infusions:  sodium chloride        LOS: 0 days   Charlynne Cousins  [facility]  02/01/2021, 6:56 AM

## 2021-02-01 NOTE — Transfer of Care (Signed)
Immediate Anesthesia Transfer of Care Note  Patient: Jane [name]  Procedure(s) Performed: COLONOSCOPY  Patient Location: Endoscopy Unit  Anesthesia Type:MAC  Level of Consciousness: awake, alert  and responds to stimulation  Airway & Oxygen Therapy: Patient Spontanous Breathing and Patient connected to nasal cannula oxygen  Post-op Assessment: Report given to RN and Post -op Vital signs reviewed and stable  Post vital signs: Reviewed and stable  Last Vitals:  Vitals Value Taken Time  BP    Temp    Pulse    Resp    SpO2      Last Pain:  Vitals:   02/01/21 1348  TempSrc: Temporal  PainSc: 0-No pain      Patients Stated Pain Goal: 0 (63/14/97 0263)  Complications: No notable events documented.

## 2021-02-01 NOTE — Op Note (Signed)
[facility] Patient Name: Jane [name] Procedure Date : 02/01/2021 MRN: [id number] Attending MD: Carlota Raspberry. Havery Moros , MD Date of Birth: 29-Jan-1946 CSN: [id number] Age: 75 Admit Type: Inpatient Procedure:                Colonoscopy Indications:              Gastrointestinal bleeding - EGD negative, maroon                            colored stool. Colonoscopy to clear lower tract. Providers:                Carlota Raspberry. Havery Moros, MD, Jane Johns, RN,                            Tyna Jaksch Technician Referring MD:              Medicines:                Monitored Anesthesia Care Complications:            No immediate complications. Estimated blood loss:                            Minimal. Estimated Blood Loss:     Estimated blood loss was minimal. Procedure:                Pre-Anesthesia Assessment:                           - Prior to the procedure, a History and Physical                            was performed, and patient medications and                            allergies were reviewed. The patient's tolerance of                            previous anesthesia was also reviewed. The risks                            and benefits of the procedure and the sedation                            options and risks were discussed with the patient.                            All questions were answered, and informed consent                            was obtained. Prior Anticoagulants: The patient has                            taken Coumadin (warfarin), last dose was 5 days  prior to procedure. ASA Grade Assessment: III - A                            patient with severe systemic disease. After                            reviewing the risks and benefits, the patient was                            deemed in satisfactory condition to undergo the                            procedure.                           After obtaining informed consent, the  colonoscope                            was passed under direct vision. Throughout the                            procedure, the patient's blood pressure, pulse, and                            oxygen saturations were monitored continuously. The                            PCF-H190DL ([id number] Olympus pediatric colonoscope                            was introduced through the anus with the intention                            of advancing to the cecum. The scope was advanced                            to the transverse colon before the procedure was                            aborted. Medications were given. The colonoscopy                            was technically difficult and complex due to                            inadequate bowel prep. The patient tolerated the                            procedure well. The quality of the bowel                            preparation was poor. The rectum was photographed. Scope In: 2:48:11 PM Scope Out: 3:00:59 PM Total Procedure Duration: 0 hours 12 minutes 48 seconds  Findings:  Hemorrhoids were found on perianal exam.      A large amount of semi-solid stool with dark maroon blood was found in       the entire colon, interfering with visualization. Lavage of the colon       was performed using copious amounts of sterile water, resulting in       incomplete clearance with continued poor visualization. I could not       traverse the transverse colon due to stool burden and began withdrawal.      Upon withdrawing the colonoscope a single punctate blood vessel with       active bleeding was found at the splenic flexure. This was monitored for       a few minutes and continued to bleed. There was no overt mucosal       abrasion to this area upon insertion of the colonoscope that was obvious       to cause the bleeding. Fulguration to stop the bleeding by argon plasma       was successful.      Internal hemorrhoids were found during retroflexion.  There was a polyp       in the left colon not removed, but visualization was quite poor       throughout the examined colon, not much clear mucosa seen. Impression:               - Preparation of the colon was poor.                           - Hemorrhoids found on perianal exam.                           - Stool and maroon blood in the entire examined                            colon.                           - A single bleeding punctate vessel in the splenic                            flexure. Treated with argon plasma coagulation                            (APC).                           - Internal hemorrhoids.                           Overall, poor visualization, lucky to see this                            vessel in the splenic flexure. I did not appreciate                            any mucosal trauma to cause this during insertion,  although possible? I would plan on another bowel                            prep tonight, and if continued bleeding then would                            repeat colonoscopy tomorrow. Recommendation:           - Return patient to hospital ward for ongoing care.                           - Clear liquid diet tonight, NPO after MN                           - Bowel prep tonight, possible repeat colonoscopy                            tomorrow unless bleeding defintively stops                           - Continue present medications.                           - GI service will follow Procedure Code(s):        --- Professional ---                           [phone], 89, Colonoscopy, flexible; with control of                            bleeding, any method Diagnosis Code(s):        --- Professional ---                           K64.8, Other hemorrhoids                           K55.21, Angiodysplasia of colon with hemorrhage                           K92.2, Gastrointestinal hemorrhage, unspecified CPT copyright 2019 American Medical  Association. All rights reserved. The codes documented in this report are preliminary and upon coder review may  be revised to meet current compliance requirements. Remo Lipps P. Havery Moros, MD 02/01/2021 3:21:29 PM This report has been signed electronically. Number of Addenda: 0

## 2021-02-02 DIAGNOSIS — N186 End stage renal disease: Secondary | ICD-10-CM | POA: Diagnosis not present

## 2021-02-02 DIAGNOSIS — K922 Gastrointestinal hemorrhage, unspecified: Secondary | ICD-10-CM | POA: Diagnosis not present

## 2021-02-02 DIAGNOSIS — Z7901 Long term (current) use of anticoagulants: Secondary | ICD-10-CM | POA: Diagnosis not present

## 2021-02-02 DIAGNOSIS — I480 Paroxysmal atrial fibrillation: Secondary | ICD-10-CM | POA: Diagnosis not present

## 2021-02-02 LAB — CBC
HCT: 30.4 % — ABNORMAL LOW (ref 36.0–46.0)
Hemoglobin: 9.4 g/dL — ABNORMAL LOW (ref 12.0–15.0)
MCH: 27.9 pg (ref 26.0–34.0)
MCHC: 30.9 g/dL (ref 30.0–36.0)
MCV: 90.2 fL (ref 80.0–100.0)
Platelets: 240 10*3/uL (ref 150–400)
RBC: 3.37 MIL/uL — ABNORMAL LOW (ref 3.87–5.11)
RDW: 18.1 % — ABNORMAL HIGH (ref 11.5–15.5)
WBC: 6.3 10*3/uL (ref 4.0–10.5)
nRBC: 0 % (ref 0.0–0.2)

## 2021-02-02 NOTE — Progress Notes (Signed)
Progress Note for [facility]  Subjective: No reports of any bleeding.  Objective: Vital signs in last 24 hours: Temp:  [97.1 F (36.2 C)-99.1 F (37.3 C)] 97.8 F (36.6 C) (07/16 0608) Pulse Rate:  [66-77] 77 (07/16 0608) Resp:  [13-23] 18 (07/16 0608) BP: (130-171)/(39-63) 159/49 (07/16 0608) SpO2:  [91 %-100 %] 94 % (07/16 0608) Weight:  [97.2 kg] 97.2 kg (07/16 0008) Last BM Date: 02/01/21  Intake/Output from previous day: 07/15 0701 - 07/16 0700 In: 0  Out: 2201 [Stool:1] Intake/Output this shift: No intake/output data recorded.  General appearance: alert and no distress GI: soft, non-tender; bowel sounds normal; no masses,  no organomegaly  Lab Results: Recent Labs    01/30/21 1400 01/31/21 1537 02/01/21 0210  WBC 5.2 4.0 5.1  HGB 9.2* 9.0* 8.3*  HCT 28.2* 29.8* 27.5*  PLT 189.0 194 216   BMET Recent Labs    01/31/21 1537 02/01/21 0210  NA 138 136  K 4.3 4.4  CL 100 99  CO2 30 30  GLUCOSE 98 85  BUN 25* 29*  CREATININE 5.99* 6.79*  CALCIUM 9.0 8.9   LFT Recent Labs    02/01/21 0210  ALBUMIN 3.0*   PT/INR Recent Labs    01/31/21 0720  LABPROT 17.1*  INR 1.4*   Hepatitis Panel No results for input(s): HEPBSAG, HCVAB, HEPAIGM, HEPBIGM in the last 72 hours. C-Diff No results for input(s): CDIFFTOX in the last 72 hours. Fecal Lactopherrin No results for input(s): FECLLACTOFRN in the last 72 hours.  Studies/Results: No results found.  Medications: Scheduled:  amiodarone  200 mg Oral Daily   Chlorhexidine Gluconate Cloth  6 each Topical Q0600   DULoxetine  40 mg Oral Daily   pantoprazole  40 mg Oral Daily   pentafluoroprop-tetrafluoroeth   Topical Q M,W,F-HD   sodium chloride flush  3 mL Intravenous Q12H   Continuous:  sodium chloride      Assessment/Plan: 1) S/p ablation of AVM at the splenic flexure. 2) Anemia. 3) ESRD.   The patient denies any issues with hematochezia or melena overnight.  She did not receive the prep to  continue her colonic cleansing until this AM.  The patient was in dialysis, however, she denies any further bloody bowel movements.  Her HGB this AM was at 8.3 g/dL which is a mild decline from 9.0 g/dL.    Plan: 1) Monitor HGB. 2) If overt bleeding is identified and/or there is a continued drop in her HGB a colonoscopy will be performed tomorrow.  LOS: 1 day   Brennan Karam D 02/02/2021, 7:32 AM

## 2021-02-02 NOTE — Plan of Care (Signed)

## 2021-02-02 NOTE — Progress Notes (Signed)
[facility] PROGRESS NOTE    Progress Note  Jane [name] Jane [name]  OAC:[id number] DOB: 1946/05/26 DOA: 01/31/2021 PCP: [name], [name], MD     Brief Narrative:   [name] is an 75 y.o. female past medical history significant for essential hypertension paroxysmal atrial fibrillation on Coumadin, anemia of chronic disease end-stage renal disease on dialysis Monday Wednesday Friday history of diverticulitis, with a history of a colectomy in 2019, duodenal ulcer comes in complaining of bloody stool for 1 week with an INR 4.71-week ago.  Has been off Coumadin since then.  Repeated INR in the ED was less than 2, GI was called endoscopy performed.     Assessment/Plan:   Acute lower GI bleed: GI was consulted endoscopy performed 01/31/2021 showed 4 gastric polyp with inflammatory appearance.  DRE showed internal and external hemorrhoids. No further episodes of bleeding, CBC is pending this morning. GI to perform colonoscopy 02/01/2021 A single bleeding punctate vessel in the splenic flexure. Treated with argon plasma coagulation.  Internal hemorrhoids. GI recommended to repeat colonoscopy today 02/02/2021.                        Transient hypotension: Antihypertensive medications were held she was fluid resuscitated her blood pressure is improved. Continue to hold antihypertensive medication.  ESRD on hemodialysis [facility]) Renal has been notified to continue HD on Monday Wednesday and Friday.  Paroxysmal atrial fibrillation (HCC) Rate controlled on amiodarone continue to hold Coumadin at this point in time.  Depression: Continue Cymbalta.  GERD: She is currently on Protonix twice daily   DVT prophylaxis: scd Family Communication:NONE Status is: Observation  The patient will require care spanning > 2 midnights and should be moved to inpatient because: Hemodynamically unstable  Dispo: The patient is from: Home              Anticipated d/c is to: Home              Patient currently is  not medically stable to d/c.   Difficult to place patient No     Code Status:     Code Status Orders  (From admission, onward)           Start     Ordered   01/31/21 1428  Full code  Continuous        01/31/21 1427           Code Status History     Date Active Date Inactive Code Status Order ID Comments User Context   01/13/2020 0608 01/19/2020 1836 Full Code [id number]  Toy Baker, MD Inpatient   06/25/2016 2116 07/18/2016 1902 Full Code [id number]  Edwin Dada, MD Inpatient   07/01/2012 0121 07/02/2012 2228 Full Code [id number]  Georgeanna Harrison, RN Inpatient      Advance Directive Documentation    Flowsheet Row Most Recent Value  Type of Advance Directive Healthcare Power of Attorney  Pre-existing out of facility DNR order (yellow form or pink MOST form) --  "MOST" Form in Place? --         IV Access:   Peripheral IV   Procedures and diagnostic studies:   No results found.   Medical Consultants:   None.   Subjective:    Jane [name] she finish her prep, she relate she has not seen any black stools or bloody stools.  Objective:    Vitals:   02/02/21 0308 02/02/21 0338 02/02/21 0423 02/02/21 0608  BP: Marland Kitchen)  141/47 (!) 157/50  (!) 159/49  Pulse:    77  Resp: (!) 23 (!) 21  18  Temp:   (P) 99.1 F (37.3 C) 97.8 F (36.6 C)  TempSrc:   (P) Oral Oral  SpO2:   (P) 100% 94%  Weight:      Height:       SpO2: 94 % O2 Flow Rate (L/min): 1 L/min   Intake/Output Summary (Last 24 hours) at 02/02/2021 0755 Last data filed at 02/02/2021 0423 Gross per 24 hour  Intake 0 ml  Output 2201 ml  Net -2201 ml    Filed Weights   01/31/21 0700 02/01/21 0508 02/02/21 0008  Weight: 95.3 kg 97.1 kg 97.2 kg    Exam: General exam: In no acute distress. Respiratory system: Good air movement and clear to auscultation. Cardiovascular system: S1 & S2 heard, RRR. No JVD. Gastrointestinal system: Abdomen is nondistended, soft and nontender.   Extremities: No pedal edema. Skin: No rashes, lesions or ulcers  Data Reviewed:    Labs: Basic Metabolic Panel: Recent Labs  Lab 01/31/21 1537 02/01/21 0210  NA 138 136  K 4.3 4.4  CL 100 99  CO2 30 30  GLUCOSE 98 85  BUN 25* 29*  CREATININE 5.99* 6.79*  CALCIUM 9.0 8.9  PHOS 3.8 4.3    GFR Estimated Creatinine Clearance: 7.4 mL/min (A) (by C-G formula based on SCr of 6.79 mg/dL (H)). Liver Function Tests: Recent Labs  Lab 01/31/21 1537 02/01/21 0210  ALBUMIN 3.1* 3.0*    No results for input(s): LIPASE, AMYLASE in the last 168 hours. No results for input(s): AMMONIA in the last 168 hours. Coagulation profile Recent Labs  Lab 01/31/21 0720  INR 1.4*    COVID-19 Labs  No results for input(s): DDIMER, FERRITIN, LDH, CRP in the last 72 hours.  Lab Results  Component Value Date   SARSCOV2NAA NEGATIVE 01/28/2021   [place] NEGATIVE 11/24/2020   [place] NEGATIVE 10/17/2020   [place] NEGATIVE 04/07/2020    CBC: Recent Labs  Lab 01/30/21 1400 01/31/21 1537 02/01/21 0210  WBC 5.2 4.0 5.1  NEUTROABS 3.3  --   --   HGB 9.2* 9.0* 8.3*  HCT 28.2* 29.8* 27.5*  MCV 85.2 90.3 91.4  PLT 189.0 194 216    Cardiac Enzymes: No results for input(s): CKTOTAL, CKMB, CKMBINDEX, TROPONINI in the last 168 hours. BNP (last 3 results) No results for input(s): PROBNP in the last 8760 hours. CBG: No results for input(s): GLUCAP in the last 168 hours. D-Dimer: No results for input(s): DDIMER in the last 72 hours. Hgb A1c: No results for input(s): HGBA1C in the last 72 hours. Lipid Profile: No results for input(s): CHOL, HDL, LDLCALC, TRIG, CHOLHDL, LDLDIRECT in the last 72 hours. Thyroid function studies: No results for input(s): TSH, T4TOTAL, T3FREE, THYROIDAB in the last 72 hours.  Invalid input(s): FREET3 Anemia work up: No results for input(s): VITAMINB12, FOLATE, FERRITIN, TIBC, IRON, RETICCTPCT in the last 72 hours. Sepsis Labs: Recent Labs   Lab 01/30/21 1400 01/31/21 1537 02/01/21 0210  WBC 5.2 4.0 5.1    Microbiology Recent Results (from the past 240 hour(s))  SARS CORONAVIRUS 2 (TAT 6-24 HRS) Nasopharyngeal Nasopharyngeal Swab     Status: None   Collection Time: 01/28/21 11:21 AM   Specimen: Nasopharyngeal Swab  Result Value Ref Range Status   SARS Coronavirus 2 NEGATIVE NEGATIVE Final    Comment: (NOTE) SARS-CoV-2 target nucleic acids are NOT DETECTED.  The SARS-CoV-2 RNA is generally detectable  in upper and lower respiratory specimens during the acute phase of infection. Negative results do not preclude SARS-CoV-2 infection, do not rule out co-infections with other pathogens, and should not be used as the sole basis for treatment or other patient management decisions. Negative results must be combined with clinical observations, patient history, and epidemiological information. The expected result is Negative.  Fact Sheet for Patients: SugarRoll.be  Fact Sheet for Healthcare Providers: https://www.woods-mathews.com/  This test is not yet approved or cleared by the [place] FDA and  has been authorized for detection and/or diagnosis of SARS-CoV-2 by FDA under an Emergency Use Authorization (EUA). This EUA will remain  in effect (meaning this test can be used) for the duration of the COVID-19 declaration under Se ction 564(b)(1) of the Act, 21 U.S.C. section 360bbb-3(b)(1), unless the authorization is terminated or revoked sooner.  Performed at [facility], [place] [address] [place], [place] [address]      Medications:    amiodarone  200 mg Oral Daily   Chlorhexidine Gluconate Cloth  6 each Topical Q0600   DULoxetine  40 mg Oral Daily   pantoprazole  40 mg Oral Daily   pentafluoroprop-tetrafluoroeth   Topical Q M,W,F-HD   sodium chloride flush  3 mL Intravenous Q12H   Continuous Infusions:  sodium chloride        LOS: 1 day   Charlynne Cousins  [facility]  02/02/2021, 7:55 AM

## 2021-02-02 NOTE — Progress Notes (Signed)
Subjective: No complaints, said tolerated HD yesterday on schedule.  Currently drinking her prep for repeat colonoscopy possibly today.  Objective Vital signs in last 24 hours: Vitals:   02/02/21 0338 02/02/21 0423 02/02/21 0608 02/02/21 0800  BP: (!) 157/50  (!) 159/49 (!) 142/48  Pulse:   77 69  Resp: (!) 21  18 18   Temp:  (P) 99.1 F (37.3 C) 97.8 F (36.6 C) 97.7 F (36.5 C)  TempSrc:  (P) Oral Oral Oral  SpO2:  (P) 100% 94% 94%  Weight:      Height:       Weight change: 0.085 kg  Physical Exam: General: Alert obese female NAD Heart: RRR, no MRG Lungs: CTA nonlabored breathing Abdomen: Obese bowel sounds normoactive, soft ND NT Extremities: No pedal edema Dialysis Access: Positive bruit right upper arm AV fistula    Dialysis Orders:  OP HD orders: [facility] - MWF 3h 41m 95kg 400/500 2K 3.5Ca Hep 2000 Mircera 66mcg q2wks - last 7/13 Calcitriol 0.37mcg qHD HD access= RU aVF   Problem/Plan:   Acute lower GI bleed = GI consult =endoscopy 7/14 for gastric polyps inflammatory.  Areas,  Internal and external hemorrhoids on DRE , 7/15/ colonoscopy= single bleeding punctate vessel in splenic flexure treated with argon plasma coag, internal hemorrhoids , GI recommended possible repeat colonoscopy today 7/16.  A.m. Hgb is now 9.4  noted on Coumadin for PAF off since admission.  BP transient hypotension on admission a.m. stable ESRD -HD MWF HD without dialysis stable tolerated Hypertension/volume  -had transient hypotension on admission BP 75/21 antihypertensive meds held given fluid with BP stable now Anemia of ESRD and GI bleed-Mircera given 75 last on 7/13 (every 2 weeks )continue on hemodialysis Aranesp not due this week.  Hgb 9.4 this a.m. and stable Metabolic bone disease -corrected calcium in goal phosphorus 4.3 continue binders 1 on p.o.'s vitamin D p.o. with dialysis History of paroxysmal atrial fibrillation rate controlled on amiodarone Coumadin on hold currently in normal  sinus on exam History of depression Cymbalta per admission team Nutrition -renal failure diet, albumin 3.0 add protein supplement when taking p.o.'s   Ernest Haber, PA-C [place] [phone] 02/02/2021,9:05 AM  LOS: 1 day   Labs: Basic Metabolic Panel: Recent Labs  Lab 01/31/21 1537 02/01/21 0210  NA 138 136  K 4.3 4.4  CL 100 99  CO2 30 30  GLUCOSE 98 85  BUN 25* 29*  CREATININE 5.99* 6.79*  CALCIUM 9.0 8.9  PHOS 3.8 4.3   Liver Function Tests: Recent Labs  Lab 01/31/21 1537 02/01/21 0210  ALBUMIN 3.1* 3.0*   No results for input(s): LIPASE, AMYLASE in the last 168 hours. No results for input(s): AMMONIA in the last 168 hours. CBC: Recent Labs  Lab 01/30/21 1400 01/31/21 1537 02/01/21 0210 02/02/21 0811  WBC 5.2 4.0 5.1 6.3  NEUTROABS 3.3  --   --   --   HGB 9.2* 9.0* 8.3* 9.4*  HCT 28.2* 29.8* 27.5* 30.4*  MCV 85.2 90.3 91.4 90.2  PLT 189.0 194 216 240   Cardiac Enzymes: No results for input(s): CKTOTAL, CKMB, CKMBINDEX, TROPONINI in the last 168 hours. CBG: No results for input(s): GLUCAP in the last 168 hours.  Studies/Results: No results found. Medications:  sodium chloride      amiodarone  200 mg Oral Daily   Chlorhexidine Gluconate Cloth  6 each Topical Q0600   DULoxetine  40 mg Oral Daily   pantoprazole  40 mg Oral Daily  pentafluoroprop-tetrafluoroeth   Topical Q M,W,F-HD   sodium chloride flush  3 mL Intravenous Q12H

## 2021-02-02 NOTE — Progress Notes (Signed)
[facility] informed that patient has arrived on floor at 0600 and bowel prep is mixed for her to drink this was communicated via chat. Arthor Captain LPN

## 2021-02-02 NOTE — Progress Notes (Signed)
[facility] informed via chat that patient now is on clear liquid diet and no NPO orders also she will return to floor around 2 am per HD RN

## 2021-02-03 DIAGNOSIS — N186 End stage renal disease: Secondary | ICD-10-CM | POA: Diagnosis not present

## 2021-02-03 DIAGNOSIS — K922 Gastrointestinal hemorrhage, unspecified: Secondary | ICD-10-CM | POA: Diagnosis not present

## 2021-02-03 DIAGNOSIS — I480 Paroxysmal atrial fibrillation: Secondary | ICD-10-CM | POA: Diagnosis not present

## 2021-02-03 DIAGNOSIS — Z7901 Long term (current) use of anticoagulants: Secondary | ICD-10-CM | POA: Diagnosis not present

## 2021-02-03 LAB — PROTIME-INR
INR: 1.3 — ABNORMAL HIGH (ref 0.8–1.2)
Prothrombin Time: 15.8 seconds — ABNORMAL HIGH (ref 11.4–15.2)

## 2021-02-03 LAB — CBC
HCT: 31.9 % — ABNORMAL LOW (ref 36.0–46.0)
Hemoglobin: 9.9 g/dL — ABNORMAL LOW (ref 12.0–15.0)
MCH: 27.7 pg (ref 26.0–34.0)
MCHC: 31 g/dL (ref 30.0–36.0)
MCV: 89.4 fL (ref 80.0–100.0)
Platelets: 241 10*3/uL (ref 150–400)
RBC: 3.57 MIL/uL — ABNORMAL LOW (ref 3.87–5.11)
RDW: 18.5 % — ABNORMAL HIGH (ref 11.5–15.5)
WBC: 6 10*3/uL (ref 4.0–10.5)
nRBC: 0 % (ref 0.0–0.2)

## 2021-02-03 NOTE — Progress Notes (Signed)
PROGRESS NOTE FOR [facility]  Subjective: No complaints.  She is feeling well.  The patient denies any hematochezia or melena.  Objective: Vital signs in last 24 hours: Temp:  [97.6 F (36.4 C)-99.1 F (37.3 C)] 98.7 F (37.1 C) (07/17 0352) Pulse Rate:  [69-87] 87 (07/17 0352) Resp:  [17-18] 18 (07/17 0352) BP: (122-182)/(50-67) 122/57 (07/17 0352) SpO2:  [94 %-98 %] 97 % (07/17 0352) Weight:  [98.5 kg] 98.5 kg (07/17 0438) Last BM Date: 02/02/21  Intake/Output from previous day: 07/16 0701 - 07/17 0700 In: 120 [P.O.:120] Out: 5 [Stool:5] Intake/Output this shift: No intake/output data recorded.  General appearance: alert and no distress GI: soft, non-tender; bowel sounds normal; no masses,  no organomegaly  Lab Results: Recent Labs    01/31/21 1537 02/01/21 0210 02/02/21 0811  WBC 4.0 5.1 6.3  HGB 9.0* 8.3* 9.4*  HCT 29.8* 27.5* 30.4*  PLT 194 216 240   BMET Recent Labs    01/31/21 1537 02/01/21 0210  NA 138 136  K 4.3 4.4  CL 100 99  CO2 30 30  GLUCOSE 98 85  BUN 25* 29*  CREATININE 5.99* 6.79*  CALCIUM 9.0 8.9   LFT Recent Labs    02/01/21 0210  ALBUMIN 3.0*   PT/INR No results for input(s): LABPROT, INR in the last 72 hours. Hepatitis Panel No results for input(s): HEPBSAG, HCVAB, HEPAIGM, HEPBIGM in the last 72 hours. C-Diff No results for input(s): CDIFFTOX in the last 72 hours. Fecal Lactopherrin No results for input(s): FECLLACTOFRN in the last 72 hours.  Studies/Results: No results found.  Medications: Scheduled:  amiodarone  200 mg Oral Daily   Chlorhexidine Gluconate Cloth  6 each Topical Q0600   DULoxetine  40 mg Oral Daily   pantoprazole  40 mg Oral Daily   pentafluoroprop-tetrafluoroeth   Topical Q M,W,F-HD   sodium chloride flush  3 mL Intravenous Q12H   Continuous:  sodium chloride      Assessment/Plan: 1) S/p ablation of a splenic flexure AVM. 2) Anemia. 3) ESRD.   The patient does not exhibit any further  bleeding.  Her HGB is pending for this AM.  If her HGB is stable or improved she can be discharged home.  Plan: 1) Await HGB. 2) Upon discharge she can follow up with Dr. Rush Landmark.  LOS: 2 days   Hasan Douse D 02/03/2021, 9:08 AM

## 2021-02-03 NOTE — Progress Notes (Signed)
Subjective: No complaints or hematochezia or melena for discharge today.  HD on schedule MWF  Objective Vital signs in last 24 hours: Vitals:   02/02/21 1958 02/03/21 0025 02/03/21 0352 02/03/21 0438  BP: (!) 145/50 135/67 (!) 122/57   Pulse: 87 85 87   Resp: 17 17 18    Temp: 98.2 F (36.8 C) 99.1 F (37.3 C) 98.7 F (37.1 C)   TempSrc: Oral Oral Oral   SpO2: 94% 97% 97%   Weight:    98.5 kg  Height:       Weight change: 1.276 kg  Physical Exam: General: Alert obese female NAD Heart: RRR, no MRG Lungs: CTA nonlabored breathing Abdomen: Obese bowel sounds normoactive, soft ND NT Extremities: No pedal edema Dialysis Access: Positive bruit right upper arm AV fistula      Dialysis Orders:  OP HD orders: [facility] - MWF 3h 94m 95kg 400/500 2K 3.5Ca Hep 2000 Mircera 50mcg q2wks - last 7/13 Calcitriol 0.63mcg qHD HD access= RU aVF   Problem/Plan:   Acute lower GI bleed = Coumadin for PAF held since admission, BP transient hypotension on admission ,stable now, GI consult =endoscopy 7/14 for gastric polyps inflammatory Areas,   7/15/ colonoscopy= single bleeding punctate vessel in splenic flexure treated with argon plasma coag, internal hemorrhoids , Hgb 9.4(7/16 ) ESRD -HD MWF HD without heparin ,dialysis stable this admission, questionable weights =bed; above EDW now ,however EDW will not change Hypertension/volume  -had transient hypotension on admission BP 75/21 antihypertensive meds held still stable without meds.  Follow-up as an outpatient kidney center  Anemia of ESRD and GI bleed-Hgb 9.4 on 7/16, Mircera given 75 last on 7/13 (every 2 weeks ) continue with outpatient hd Metabolic bone disease -corrected calcium in goal phosphorus 4.3 continue binders 1 on p.o.'s vitamin D p.o. with dialysis History of paroxysmal atrial fibrillation rate controlled on amiodarone /Coumadin on hold/plan per admit and GI /in normal sinus on exam History of depression Cymbalta per admission  team Nutrition -renal failure diet, albumin 3.0 add protein supplement when taking p.o.'s  Ernest Haber, PA-C [place] [phone] 02/03/2021,9:33 AM  LOS: 2 days   Labs: Basic Metabolic Panel: Recent Labs  Lab 01/31/21 1537 02/01/21 0210  NA 138 136  K 4.3 4.4  CL 100 99  CO2 30 30  GLUCOSE 98 85  BUN 25* 29*  CREATININE 5.99* 6.79*  CALCIUM 9.0 8.9  PHOS 3.8 4.3   Liver Function Tests: Recent Labs  Lab 01/31/21 1537 02/01/21 0210  ALBUMIN 3.1* 3.0*   No results for input(s): LIPASE, AMYLASE in the last 168 hours. No results for input(s): AMMONIA in the last 168 hours. CBC: Recent Labs  Lab 01/30/21 1400 01/31/21 1537 02/01/21 0210 02/02/21 0811  WBC 5.2 4.0 5.1 6.3  NEUTROABS 3.3  --   --   --   HGB 9.2* 9.0* 8.3* 9.4*  HCT 28.2* 29.8* 27.5* 30.4*  MCV 85.2 90.3 91.4 90.2  PLT 189.0 194 216 240   Cardiac Enzymes: No results for input(s): CKTOTAL, CKMB, CKMBINDEX, TROPONINI in the last 168 hours. CBG: No results for input(s): GLUCAP in the last 168 hours.  Studies/Results: No results found. Medications:  sodium chloride      amiodarone  200 mg Oral Daily   Chlorhexidine Gluconate Cloth  6 each Topical Q0600   DULoxetine  40 mg Oral Daily   pantoprazole  40 mg Oral Daily   pentafluoroprop-tetrafluoroeth   Topical Q M,W,F-HD   sodium chloride  flush  3 mL Intravenous Q12H

## 2021-02-03 NOTE — Discharge Summary (Signed)
Physician Discharge Summary  Jane [name] [id number]:[id number] DOB: 09-Jul-1946 DOA: 01/31/2021  PCP: [name], [name], MD  Admit date: 01/31/2021 Discharge date: 02/03/2021  Admitted From: Home Disposition:  Home  Recommendations for Outpatient Follow-up:  Follow up with PCP in 1-2 weeks   Home Health:No Equipment/Devices:None  Discharge Condition:Stable  CODE STATUS:Full Diet recommendation: Heart Healthy    75 y.o. female past medical history significant for essential hypertension paroxysmal atrial fibrillation on Coumadin, anemia of chronic disease end-stage renal disease on dialysis Monday Wednesday Friday history of diverticulitis, with a history of a colectomy in 2019, duodenal ulcer comes in complaining of bloody stool for 1 week with an INR 4.71-week ago.  Has been off Coumadin since then.  Repeated INR in the ED was less than 2, GI was called endoscopy performed.  Discharge Diagnoses:  Principal Problem:   GI bleed Active Problems:   ESRD on hemodialysis (HCC)   Paroxysmal atrial fibrillation (HCC)   Morbid obesity (HCC)   Chronic anticoagulation   Depression, major, recurrent, moderate (HCC)   Acute GI bleeding  Acute GI bleed: Coumadin was held INR drifted down. GI was consulted they perform an EGD on 01/31/2021 showed 4 gastric polyp with inflammatory appearance, direct rectal examination showed internal and external hemorrhoids. She had no further episodes of GI bleed in house. CBC showed her hemoglobin was improving. GI performed colonoscopy on 01/22/2021 which showed a single bleeding punctuated vessel in the splenic flexure treated with argon plasma. Her hemoglobin was monitored and remained stable.  Transient hypotension: Antihypertensive medication were held she was fluid resuscitated she will resume them as an outpatient.  End-stage renal disease on hemodialysis: Continue hemodialysis per renal as an outpatient.  Paroxysmal atrial fibrillation: Remained rate  controlled on amiodarone, she will resume her Coumadin as an outpatient.  Depression: No changes made continue Cymbalta.  GERD: Continue Protonix.    Discharge Instructions  Discharge Instructions     Diet - low sodium heart healthy   Complete by: As directed    Increase activity slowly   Complete by: As directed       Allergies as of 02/03/2021       Reactions   Latex Rash   Penicillins Other (See Comments)   Yeast infection / Childhood   Sulfa Antibiotics Rash   Tape Other (See Comments)   Plastic, silicone, and paper tape causes bruising and pulls off skin. Cloth tape works fine        Medication List     TAKE these medications    albuterol 108 (90 Base) MCG/ACT inhaler Commonly known as: VENTOLIN HFA Inhale 1 puff into the lungs every 6 (six) hours as needed for wheezing or shortness of breath. What changed: how much to take   amiodarone 200 MG tablet Commonly known as: Pacerone Take 1 tablet (200 mg total) by mouth daily.   amLODipine 10 MG tablet Commonly known as: NORVASC Take 1 tablet (10 mg total) by mouth daily. What changed: when to take this   carvedilol 25 MG tablet Commonly known as: COREG Take 1.5 tablets (37.5 mg total) by mouth 2 (two) times daily with a meal.   DIALYVITE 800 WITH ZINC 0.8 MG Tabs Take 1 tablet by mouth daily.   diltiazem 60 MG tablet Commonly known as: Cardizem Take 1 tablet (60 mg total) by mouth 3 (three) times daily as needed (Atrial fibrillation).   DULoxetine 20 MG capsule Commonly known as: CYMBALTA Take 2 capsules (40 mg total) by mouth  daily.   ethyl chloride spray Apply 1 application topically every Monday, Wednesday, and Friday with hemodialysis.   hydrALAZINE 25 MG tablet Commonly known as: APRESOLINE Take 1 tablet (25 mg total) by mouth 3 (three) times daily. What changed: how much to take   isosorbide dinitrate 30 MG tablet Commonly known as: ISORDIL Take 1 tablet (30 mg total) by mouth 3  (three) times daily.   Linzess 145 MCG Caps capsule Generic drug: linaclotide TAKE 1 CAPSULE BY MOUTH ONCE DAILY BEFORE BREAKFAST What changed: See the new instructions.   MIRCERA IJ Mircera   omeprazole 40 MG capsule Commonly known as: PRILOSEC TAKE 1 CAPSULE BY MOUTH TWICE DAILY BEFORE A MEAL What changed: See the new instructions.   warfarin 5 MG tablet Commonly known as: COUMADIN Take as directed. If you are unsure how to take this medication, talk to your nurse or doctor. Original instructions: USE AS DIRECTED BY COUMADIN CLINIC What changed:  how much to take how to take this when to take this additional instructions        Allergies  Allergen Reactions   Latex Rash   Penicillins Other (See Comments)    Yeast infection / Childhood   Sulfa Antibiotics Rash   Tape Other (See Comments)    Plastic, silicone, and paper tape causes bruising and pulls off skin. Cloth tape works fine    Consultations: GI [place]   Procedures/Studies: No results found.    Subjective: No complains  Discharge Exam: Vitals:   02/03/21 0025 02/03/21 0352  BP: 135/67 (!) 122/57  Pulse: 85 87  Resp: 17 18  Temp: 99.1 F (37.3 C) 98.7 F (37.1 C)  SpO2: 97% 97%   Vitals:   02/02/21 1958 02/03/21 0025 02/03/21 0352 02/03/21 0438  BP: (!) 145/50 135/67 (!) 122/57   Pulse: 87 85 87   Resp: 17 17 18    Temp: 98.2 F (36.8 C) 99.1 F (37.3 C) 98.7 F (37.1 C)   TempSrc: Oral Oral Oral   SpO2: 94% 97% 97%   Weight:    98.5 kg  Height:        General: Pt is alert, awake, not in acute distress Cardiovascular: RRR, S1/S2 +, no rubs, no gallops Respiratory: CTA bilaterally, no wheezing, no rhonchi Abdominal: Soft, NT, ND, bowel sounds + Extremities: no edema, no cyanosis    The results of significant diagnostics from this hospitalization (including imaging, microbiology, ancillary and laboratory) are listed below for reference.     Microbiology: Recent Results (from  the past 240 hour(s))  SARS CORONAVIRUS 2 (TAT 6-24 HRS) Nasopharyngeal Nasopharyngeal Swab     Status: None   Collection Time: 01/28/21 11:21 AM   Specimen: Nasopharyngeal Swab  Result Value Ref Range Status   SARS Coronavirus 2 NEGATIVE NEGATIVE Final    Comment: (NOTE) SARS-CoV-2 target nucleic acids are NOT DETECTED.  The SARS-CoV-2 RNA is generally detectable in upper and lower respiratory specimens during the acute phase of infection. Negative results do not preclude SARS-CoV-2 infection, do not rule out co-infections with other pathogens, and should not be used as the sole basis for treatment or other patient management decisions. Negative results must be combined with clinical observations, patient history, and epidemiological information. The expected result is Negative.  Fact Sheet for Patients: SugarRoll.be  Fact Sheet for Healthcare Providers: https://www.woods-mathews.com/  This test is not yet approved or cleared by the [place] FDA and  has been authorized for detection and/or diagnosis of SARS-CoV-2 by FDA under  an Emergency Use Authorization (EUA). This EUA will remain  in effect (meaning this test can be used) for the duration of the COVID-19 declaration under Se ction 564(b)(1) of the Act, 21 U.S.C. section 360bbb-3(b)(1), unless the authorization is terminated or revoked sooner.  Performed at [facility], [place] [address], [place], [place] [address]      Labs: BNP (last 3 results) No results for input(s): BNP in the last 8760 hours. Basic Metabolic Panel: Recent Labs  Lab 01/31/21 1537 02/01/21 0210  NA 138 136  K 4.3 4.4  CL 100 99  CO2 30 30  GLUCOSE 98 85  BUN 25* 29*  CREATININE 5.99* 6.79*  CALCIUM 9.0 8.9  PHOS 3.8 4.3   Liver Function Tests: Recent Labs  Lab 01/31/21 1537 02/01/21 0210  ALBUMIN 3.1* 3.0*   No results for input(s): LIPASE, AMYLASE in the last 168 hours. No  results for input(s): AMMONIA in the last 168 hours. CBC: Recent Labs  Lab 01/30/21 1400 01/31/21 1537 02/01/21 0210 02/02/21 0811 02/03/21 0953  WBC 5.2 4.0 5.1 6.3 6.0  NEUTROABS 3.3  --   --   --   --   HGB 9.2* 9.0* 8.3* 9.4* 9.9*  HCT 28.2* 29.8* 27.5* 30.4* 31.9*  MCV 85.2 90.3 91.4 90.2 89.4  PLT 189.0 194 216 240 241   Cardiac Enzymes: No results for input(s): CKTOTAL, CKMB, CKMBINDEX, TROPONINI in the last 168 hours. BNP: Invalid input(s): POCBNP CBG: No results for input(s): GLUCAP in the last 168 hours. D-Dimer No results for input(s): DDIMER in the last 72 hours. Hgb A1c No results for input(s): HGBA1C in the last 72 hours. Lipid Profile No results for input(s): CHOL, HDL, LDLCALC, TRIG, CHOLHDL, LDLDIRECT in the last 72 hours. Thyroid function studies No results for input(s): TSH, T4TOTAL, T3FREE, THYROIDAB in the last 72 hours.  Invalid input(s): FREET3 Anemia work up No results for input(s): VITAMINB12, FOLATE, FERRITIN, TIBC, IRON, RETICCTPCT in the last 72 hours. Urinalysis    Component Value Date/Time   COLORURINE YELLOW 08/04/2016 0932   APPEARANCEUR HAZY (A) 08/04/2016 0932   LABSPEC 1.009 08/04/2016 0932   PHURINE 6.0 08/04/2016 0932   GLUCOSEU NEGATIVE 08/04/2016 0932   HGBUR NEGATIVE 08/04/2016 0932   BILIRUBINUR NEGATIVE 08/04/2016 0932   KETONESUR NEGATIVE 08/04/2016 0932   PROTEINUR 30 (A) 08/04/2016 0932   UROBILINOGEN 0.2 06/30/2012 2201   NITRITE NEGATIVE 08/04/2016 0932   LEUKOCYTESUR NEGATIVE 08/04/2016 0932   Sepsis Labs Invalid input(s): PROCALCITONIN,  WBC,  LACTICIDVEN Microbiology Recent Results (from the past 240 hour(s))  SARS CORONAVIRUS 2 (TAT 6-24 HRS) Nasopharyngeal Nasopharyngeal Swab     Status: None   Collection Time: 01/28/21 11:21 AM   Specimen: Nasopharyngeal Swab  Result Value Ref Range Status   SARS Coronavirus 2 NEGATIVE NEGATIVE Final    Comment: (NOTE) SARS-CoV-2 target nucleic acids are NOT  DETECTED.  The SARS-CoV-2 RNA is generally detectable in upper and lower respiratory specimens during the acute phase of infection. Negative results do not preclude SARS-CoV-2 infection, do not rule out co-infections with other pathogens, and should not be used as the sole basis for treatment or other patient management decisions. Negative results must be combined with clinical observations, patient history, and epidemiological information. The expected result is Negative.  Fact Sheet for Patients: SugarRoll.be  Fact Sheet for Healthcare Providers: https://www.woods-mathews.com/  This test is not yet approved or cleared by the [place] FDA and  has been authorized for detection and/or diagnosis  of SARS-CoV-2 by FDA under an Emergency Use Authorization (EUA). This EUA will remain  in effect (meaning this test can be used) for the duration of the COVID-19 declaration under Se ction 564(b)(1) of the Act, 21 U.S.C. section 360bbb-3(b)(1), unless the authorization is terminated or revoked sooner.  Performed at [facility], [place] [address] [place], [place] [address]       SIGNED:   Charlynne Cousins, MD  [facility] 02/03/2021, 12:03 PM Pager   If 7PM-7AM, please contact night-coverage [email or web address] Password [email or web address]

## 2021-02-03 NOTE — Progress Notes (Signed)
Patient denies any episodes of hematochezia/melena overnight. She had a 1 BM last night and it was yellowish in color-according to the NT and patient. Recent HGB 9.4. denies any pain at this moment.  Call bell within reach and will continue to monitor.

## 2021-02-03 NOTE — Progress Notes (Signed)
[facility] PROGRESS NOTE    Progress Note  Jane [name]  ZOX:[id number] DOB: 27-Jan-1946 DOA: 01/31/2021 PCP: [name], [name], MD     Brief Narrative:   Jane [name] is an 75 y.o. female past medical history significant for essential hypertension paroxysmal atrial fibrillation on Coumadin, anemia of chronic disease end-stage renal disease on dialysis Monday Wednesday Friday history of diverticulitis, with a history of a colectomy in 2019, duodenal ulcer comes in complaining of bloody stool for 1 week with an INR 4.71-week ago.  Has been off Coumadin since then.  Repeated INR in the ED was less than 2, GI was called endoscopy performed.     Assessment/Plan:   Acute lower GI bleed: No further episodes of bleeding, CBC is pending this morning. GI recommended to continue to monitor with no further bleeding no indication for intervention.     GI to dictate when to restart Coumadin            Transient hypotension: Antihypertensive medications were held she was fluid resuscitated her blood pressure is improved. Continue to hold antihypertensive medication.  ESRD on hemodialysis [facility]) Renal has been notified to continue HD on Monday Wednesday and Friday.  Paroxysmal atrial fibrillation (HCC) Rate controlled on amiodarone. Coumadin held GI to dictate when to restart Coumadin.  Depression: Continue Cymbalta.  GERD: She is currently on Protonix twice daily   DVT prophylaxis: scd Family Communication:NONE Status is: Observation  The patient will require care spanning > 2 midnights and should be moved to inpatient because: Hemodynamically unstable  Dispo: The patient is from: Home              Anticipated d/c is to: Home              Patient currently is not medically stable to d/c.   Difficult to place patient No     Code Status:     Code Status Orders  (From admission, onward)           Start     Ordered   01/31/21 1428  Full code  Continuous         01/31/21 1427           Code Status History     Date Active Date Inactive Code Status Order ID Comments User Context   01/13/2020 0608 01/19/2020 1836 Full Code [id number]  Toy Baker, MD Inpatient   06/25/2016 2116 07/18/2016 1902 Full Code [id number]  Edwin Dada, MD Inpatient   07/01/2012 0121 07/02/2012 2228 Full Code [id number]  Georgeanna Harrison, RN Inpatient      Advance Directive Documentation    Flowsheet Row Most Recent Value  Type of Advance Directive Healthcare Power of Attorney  Pre-existing out of facility DNR order (yellow form or pink MOST form) --  "MOST" Form in Place? --         IV Access:   Peripheral IV   Procedures and diagnostic studies:   No results found.   Medical Consultants:   None.   Subjective:    Conor M Ryker hungry no bloody bowel movements.  Objective:    Vitals:   02/02/21 1958 02/03/21 0025 02/03/21 0352 02/03/21 0438  BP: (!) 145/50 135/67 (!) 122/57   Pulse: 87 85 87   Resp: 17 17 18    Temp: 98.2 F (36.8 C) 99.1 F (37.3 C) 98.7 F (37.1 C)   TempSrc: Oral Oral Oral   SpO2: 94% 97% 97%   Weight:  98.5 kg  Height:       SpO2: 97 % O2 Flow Rate (L/min): 1 L/min   Intake/Output Summary (Last 24 hours) at 02/03/2021 0805 Last data filed at 02/02/2021 1719 Gross per 24 hour  Intake 120 ml  Output 5 ml  Net 115 ml    Filed Weights   02/01/21 0508 02/02/21 0008 02/03/21 0438  Weight: 97.1 kg 97.2 kg 98.5 kg    Exam: General exam: In no acute distress. Respiratory system: Good air movement and clear to auscultation. Cardiovascular system: S1 & S2 heard, RRR. No JVD. Gastrointestinal system: Abdomen is nondistended, soft and nontender.  Extremities: No pedal edema. Skin: No rashes, lesions or ulcers  Data Reviewed:    Labs: Basic Metabolic Panel: Recent Labs  Lab 01/31/21 1537 02/01/21 0210  NA 138 136  K 4.3 4.4  CL 100 99  CO2 30 30  GLUCOSE 98 85  BUN 25* 29*  CREATININE  5.99* 6.79*  CALCIUM 9.0 8.9  PHOS 3.8 4.3    GFR Estimated Creatinine Clearance: 7.4 mL/min (A) (by C-G formula based on SCr of 6.79 mg/dL (H)). Liver Function Tests: Recent Labs  Lab 01/31/21 1537 02/01/21 0210  ALBUMIN 3.1* 3.0*    No results for input(s): LIPASE, AMYLASE in the last 168 hours. No results for input(s): AMMONIA in the last 168 hours. Coagulation profile Recent Labs  Lab 01/31/21 0720  INR 1.4*    COVID-19 Labs  No results for input(s): DDIMER, FERRITIN, LDH, CRP in the last 72 hours.  Lab Results  Component Value Date   SARSCOV2NAA NEGATIVE 01/28/2021   SARSCOV2NAA NEGATIVE 11/24/2020   [place] NEGATIVE 10/17/2020   [place] NEGATIVE 04/07/2020    CBC: Recent Labs  Lab 01/30/21 1400 01/31/21 1537 02/01/21 0210 02/02/21 0811  WBC 5.2 4.0 5.1 6.3  NEUTROABS 3.3  --   --   --   HGB 9.2* 9.0* 8.3* 9.4*  HCT 28.2* 29.8* 27.5* 30.4*  MCV 85.2 90.3 91.4 90.2  PLT 189.0 194 216 240    Cardiac Enzymes: No results for input(s): CKTOTAL, CKMB, CKMBINDEX, TROPONINI in the last 168 hours. BNP (last 3 results) No results for input(s): PROBNP in the last 8760 hours. CBG: No results for input(s): GLUCAP in the last 168 hours. D-Dimer: No results for input(s): DDIMER in the last 72 hours. Hgb A1c: No results for input(s): HGBA1C in the last 72 hours. Lipid Profile: No results for input(s): CHOL, HDL, LDLCALC, TRIG, CHOLHDL, LDLDIRECT in the last 72 hours. Thyroid function studies: No results for input(s): TSH, T4TOTAL, T3FREE, THYROIDAB in the last 72 hours.  Invalid input(s): FREET3 Anemia work up: No results for input(s): VITAMINB12, FOLATE, FERRITIN, TIBC, IRON, RETICCTPCT in the last 72 hours. Sepsis Labs: Recent Labs  Lab 01/30/21 1400 01/31/21 1537 02/01/21 0210 02/02/21 0811  WBC 5.2 4.0 5.1 6.3    Microbiology Recent Results (from the past 240 hour(s))  SARS CORONAVIRUS 2 (TAT 6-24 HRS) Nasopharyngeal Nasopharyngeal  Swab     Status: None   Collection Time: 01/28/21 11:21 AM   Specimen: Nasopharyngeal Swab  Result Value Ref Range Status   SARS Coronavirus 2 NEGATIVE NEGATIVE Final    Comment: (NOTE) SARS-CoV-2 target nucleic acids are NOT DETECTED.  The SARS-CoV-2 RNA is generally detectable in upper and lower respiratory specimens during the acute phase of infection. Negative results do not preclude SARS-CoV-2 infection, do not rule out co-infections with other pathogens, and should not be used as the sole basis for treatment  or other patient management decisions. Negative results must be combined with clinical observations, patient history, and epidemiological information. The expected result is Negative.  Fact Sheet for Patients: SugarRoll.be  Fact Sheet for Healthcare Providers: https://www.woods-mathews.com/  This test is not yet approved or cleared by the [place] FDA and  has been authorized for detection and/or diagnosis of SARS-CoV-2 by FDA under an Emergency Use Authorization (EUA). This EUA will remain  in effect (meaning this test can be used) for the duration of the COVID-19 declaration under Se ction 564(b)(1) of the Act, 21 U.S.C. section 360bbb-3(b)(1), unless the authorization is terminated or revoked sooner.  Performed at [facility], [place] [address], [place], [place] [address]      Medications:    amiodarone  200 mg Oral Daily   Chlorhexidine Gluconate Cloth  6 each Topical Q0600   DULoxetine  40 mg Oral Daily   pantoprazole  40 mg Oral Daily   pentafluoroprop-tetrafluoroeth   Topical Q M,W,F-HD   sodium chloride flush  3 mL Intravenous Q12H   Continuous Infusions:  sodium chloride        LOS: 2 days   Charlynne Cousins  [facility]  02/03/2021, 8:05 AM

## 2021-02-03 NOTE — Progress Notes (Signed)
Patient discharged to home with instructions. 

## 2021-02-04 DIAGNOSIS — N186 End stage renal disease: Secondary | ICD-10-CM | POA: Diagnosis not present

## 2021-02-04 DIAGNOSIS — N2581 Secondary hyperparathyroidism of renal origin: Secondary | ICD-10-CM | POA: Diagnosis not present

## 2021-02-04 DIAGNOSIS — D631 Anemia in chronic kidney disease: Secondary | ICD-10-CM | POA: Diagnosis not present

## 2021-02-04 DIAGNOSIS — I120 Hypertensive chronic kidney disease with stage 5 chronic kidney disease or end stage renal disease: Secondary | ICD-10-CM | POA: Diagnosis not present

## 2021-02-04 DIAGNOSIS — K922 Gastrointestinal hemorrhage, unspecified: Secondary | ICD-10-CM | POA: Diagnosis not present

## 2021-02-04 DIAGNOSIS — Z992 Dependence on renal dialysis: Secondary | ICD-10-CM | POA: Diagnosis not present

## 2021-02-04 NOTE — Progress Notes (Deleted)
No show - rescheduled

## 2021-02-06 DIAGNOSIS — N186 End stage renal disease: Secondary | ICD-10-CM | POA: Diagnosis not present

## 2021-02-06 DIAGNOSIS — N2581 Secondary hyperparathyroidism of renal origin: Secondary | ICD-10-CM | POA: Diagnosis not present

## 2021-02-06 DIAGNOSIS — D631 Anemia in chronic kidney disease: Secondary | ICD-10-CM | POA: Diagnosis not present

## 2021-02-06 DIAGNOSIS — Z992 Dependence on renal dialysis: Secondary | ICD-10-CM | POA: Diagnosis not present

## 2021-02-07 ENCOUNTER — Ambulatory Visit: Payer: Medicare Other | Admitting: Pharmacist

## 2021-02-07 ENCOUNTER — Ambulatory Visit
Admission: RE | Admit: 2021-02-07 | Discharge: 2021-02-07 | Disposition: A | Discharge status: Home / Self Care | Payer: Medicare Other | Source: Ambulatory Visit | Attending: Podiatry | Admitting: Podiatry

## 2021-02-07 ENCOUNTER — Other Ambulatory Visit: Payer: Self-pay

## 2021-02-07 DIAGNOSIS — M19072 Primary osteoarthritis, left ankle and foot: Secondary | ICD-10-CM | POA: Diagnosis not present

## 2021-02-07 DIAGNOSIS — M25472 Effusion, left ankle: Secondary | ICD-10-CM | POA: Diagnosis not present

## 2021-02-07 DIAGNOSIS — M65872 Other synovitis and tenosynovitis, left ankle and foot: Secondary | ICD-10-CM | POA: Diagnosis not present

## 2021-02-07 DIAGNOSIS — I480 Paroxysmal atrial fibrillation: Secondary | ICD-10-CM | POA: Diagnosis not present

## 2021-02-07 DIAGNOSIS — Z5181 Encounter for therapeutic drug level monitoring: Secondary | ICD-10-CM | POA: Diagnosis not present

## 2021-02-07 DIAGNOSIS — M76822 Posterior tibial tendinitis, left leg: Secondary | ICD-10-CM

## 2021-02-07 DIAGNOSIS — Z7901 Long term (current) use of anticoagulants: Secondary | ICD-10-CM

## 2021-02-07 DIAGNOSIS — R600 Localized edema: Secondary | ICD-10-CM | POA: Diagnosis not present

## 2021-02-07 LAB — POCT INR: INR: 1.2 — AB (ref 2.0–3.0)

## 2021-02-07 NOTE — Progress Notes (Signed)
Anticoagulation Management Jane [name] is a 75 y.o. female who reports to the clinic for monitoring of warfarin treatment.    Indication: atrial fibrillation CHA2DS2 Vasc Score 4 (Age >19, female, HTN hx), HAS-BLED 2 (Age>65, renal disease)  Duration: indefinite Supervising physician: Adrian Prows  Anticoagulation Clinic Visit History:  Patient does report signs/symptoms of bleeding or thromboembolism.  Recent hospital admission for EGD/colonoscopy 01/31/21. Pt complaining of persistent GI bleeding prior to the procedure. EGD significant for 4 gastric polyps and hiatal hernia. Colonoscopy significant for internal and external non-bleeding hemorrhoids. Active bleed from single bleeding punctate vessel in the splenic flexure. Treated with argon plasma coagulation.   Pathology reported listed below:  A. AMPULLA, BIOPSY:  - Scant fragments of benign, superficial ampullary mucosa  - Negative for dysplasia or malignancy in the submitted sample  B. DUODENAL SCAR SITE, EMR:  - Duodenal mucosa with nonspecific reactive changes  - Negative for dysplasia or malignancy  Other recent changes: Pt continues to have persistent chronic pain in her back, knee, and neck. Significantly affecting patient's QOLs and ADLs. Working with physical therapy and podiatry to help address pain.   Pt held warfarin dose from 7/7-7/18 as directed. Pt restarted on home warfarin dose on 7/19.    Anticoagulation Episode Summary     Current INR goal:  2.0-3.0  TTR:  66.3 % (2.3 y)  Next INR check:  02/14/2021  INR from last check:  1.2 (02/07/2021)  Weekly max warfarin dose:    Target end date:  Indefinite  INR check location:    Preferred lab:    Send INR reminders to:     Indications   Paroxysmal atrial fibrillation (HCC) [I48.0] Monitoring for long-term anticoagulant use [Z51.81 Z79.01]        Comments:           Allergies  Allergen Reactions   Latex Rash   Penicillins Other (See Comments)    Yeast  infection / Childhood   Sulfa Antibiotics Rash   Tape Other (See Comments)    Plastic, silicone, and paper tape causes bruising and pulls off skin. Cloth tape works fine    Current Outpatient Medications:    albuterol (PROVENTIL HFA;VENTOLIN HFA) 108 (90 Base) MCG/ACT inhaler, Inhale 1 puff into the lungs every 6 (six) hours as needed for wheezing or shortness of breath., Disp: 6.7 g, Rfl: 2   amiodarone (PACERONE) 200 MG tablet, Take 1 tablet (200 mg total) by mouth daily., Disp: 90 tablet, Rfl: 1   amLODipine (NORVASC) 10 MG tablet, Take 1 tablet (10 mg total) by mouth daily., Disp: 90 tablet, Rfl: 3   B Complex-C-Zn-Folic Acid (DIALYVITE 130 WITH ZINC) 0.8 MG TABS, Take 1 tablet by mouth daily., Disp: , Rfl:    carvedilol (COREG) 25 MG tablet, Take 1.5 tablets (37.5 mg total) by mouth 2 (two) times daily with a meal., Disp: 240 tablet, Rfl: 3   diltiazem (CARDIZEM) 60 MG tablet, Take 1 tablet (60 mg total) by mouth 3 (three) times daily as needed (Atrial fibrillation)., Disp: 30 tablet, Rfl: 2   DULoxetine (CYMBALTA) 20 MG capsule, Take 2 capsules (40 mg total) by mouth daily., Disp: 180 capsule, Rfl: 2   ethyl chloride spray, Apply 1 application topically every Monday, Wednesday, and Friday with hemodialysis., Disp: , Rfl: 12   hydrALAZINE (APRESOLINE) 25 MG tablet, Take 1 tablet (25 mg total) by mouth 3 (three) times daily., Disp: 270 tablet, Rfl: 3   isosorbide dinitrate (ISORDIL) 30 MG tablet, Take  1 tablet (30 mg total) by mouth 3 (three) times daily., Disp: 270 tablet, Rfl: 3   LINZESS 145 MCG CAPS capsule, TAKE 1 CAPSULE BY MOUTH ONCE DAILY BEFORE BREAKFAST, Disp: 30 capsule, Rfl: 0   Methoxy PEG-Epoetin Beta (MIRCERA IJ), Mircera, Disp: , Rfl:    omeprazole (PRILOSEC) 40 MG capsule, TAKE 1 CAPSULE BY MOUTH TWICE DAILY BEFORE A MEAL, Disp: 60 capsule, Rfl: 0   warfarin (COUMADIN) 5 MG tablet, USE AS DIRECTED BY COUMADIN CLINIC, Disp: 113 tablet, Rfl: 5 Past Medical History:  Diagnosis  Date   Acid reflux    Anemia of chronic disease    Arthritis    Asthma    Atrial fibrillation (HCC)    Bilateral carotid bruits    Complication of anesthesia    "hard to wake up, I have sleep apnea" no CPAP   Depression    Diverticulitis    Duodenal ulcer    Dysrhythmia    Afib   ESRD (end stage renal disease) ([place])    MWF [place]   Headache    History of blood transfusion    Hypertension    Malaise and fatigue    Orthostatic hypotension    Shortness of breath    " when I walk to fast"   Sleep apnea    Syncope    Tubulovillous adenoma of colon    ASSESSMENT  Recent Results: The most recent result is correlated with 27.5 mg per week:  Lab Results  Component Value Date   INR 1.2 (A) 02/07/2021   INR 1.3 (H) 02/03/2021   INR 1.4 (H) 01/31/2021    Anticoagulation Dosing: Description   INR below goal. Continue previous home dose of 2.5 mg Mon, Wed, Fri and 5 mg all other days. Recheck INR in 1 week.       INR today: Supratherapeutic. INR below goal in setting of held doses in setting of hospital admission and scheduled EGD/colonoscopy. Only 2 days since restarting previous home maintenance dose. Reports no recurrence of continued GI bleed since. Continues to have slow follow-up. Denies any complains of thromboembolic or stroke/TIA symptoms. Will continue previous maintenance dose and continue close monitoring.    PLAN Weekly dose was unchanged by 0% to 27.5 mg/week. Continue taking 2.5 mg every Mon, Wed, Fri and 5 mg all other days. Recheck INR in 1 weeks.     Patient Instructions  INR below goal. Continue previous home dose of 2.5 mg Mon, Wed, Fri and 5 mg all other days. Recheck INR in 1 week.  Patient advised to contact clinic or seek medical attention if signs/symptoms of bleeding or thromboembolism occur.  Patient verbalized understanding by repeating back information and was advised to contact me if further medication-related questions arise.    Follow-up Return in about 1 week (around 02/14/2021).  [name], PharmD  15 minutes spent face-to-face with the patient during the encounter. 50% of time spent on education, including signs/sx bleeding and clotting, as well as food and drug interactions with warfarin. 50% of time was spent on fingerprick POC INR sample collection,processing, results determination, and documentation

## 2021-02-08 DIAGNOSIS — D631 Anemia in chronic kidney disease: Secondary | ICD-10-CM | POA: Diagnosis not present

## 2021-02-08 DIAGNOSIS — N186 End stage renal disease: Secondary | ICD-10-CM | POA: Diagnosis not present

## 2021-02-08 DIAGNOSIS — Z992 Dependence on renal dialysis: Secondary | ICD-10-CM | POA: Diagnosis not present

## 2021-02-08 DIAGNOSIS — N2581 Secondary hyperparathyroidism of renal origin: Secondary | ICD-10-CM | POA: Diagnosis not present

## 2021-02-11 DIAGNOSIS — Z992 Dependence on renal dialysis: Secondary | ICD-10-CM | POA: Diagnosis not present

## 2021-02-11 DIAGNOSIS — N2581 Secondary hyperparathyroidism of renal origin: Secondary | ICD-10-CM | POA: Diagnosis not present

## 2021-02-11 DIAGNOSIS — N186 End stage renal disease: Secondary | ICD-10-CM | POA: Diagnosis not present

## 2021-02-11 DIAGNOSIS — D631 Anemia in chronic kidney disease: Secondary | ICD-10-CM | POA: Diagnosis not present

## 2021-02-13 DIAGNOSIS — Z992 Dependence on renal dialysis: Secondary | ICD-10-CM | POA: Diagnosis not present

## 2021-02-13 DIAGNOSIS — N186 End stage renal disease: Secondary | ICD-10-CM | POA: Diagnosis not present

## 2021-02-13 DIAGNOSIS — D631 Anemia in chronic kidney disease: Secondary | ICD-10-CM | POA: Diagnosis not present

## 2021-02-13 DIAGNOSIS — N2581 Secondary hyperparathyroidism of renal origin: Secondary | ICD-10-CM | POA: Diagnosis not present

## 2021-02-13 NOTE — Patient Instructions (Signed)
INR below goal. Continue previous home dose of 2.5 mg Mon, Wed, Fri and 5 mg all other days. Recheck INR in 1 week.

## 2021-02-14 ENCOUNTER — Ambulatory Visit: Payer: Medicare Other | Admitting: Pharmacist

## 2021-02-14 ENCOUNTER — Other Ambulatory Visit: Payer: Self-pay

## 2021-02-14 DIAGNOSIS — Z7901 Long term (current) use of anticoagulants: Secondary | ICD-10-CM | POA: Diagnosis not present

## 2021-02-14 DIAGNOSIS — Z5181 Encounter for therapeutic drug level monitoring: Secondary | ICD-10-CM

## 2021-02-14 DIAGNOSIS — I480 Paroxysmal atrial fibrillation: Secondary | ICD-10-CM | POA: Diagnosis not present

## 2021-02-14 LAB — POCT INR: INR: 3.3 — AB (ref 2.0–3.0)

## 2021-02-14 NOTE — Progress Notes (Signed)
Anticoagulation Management Jane [name] is a 75 y.o. female who reports to the clinic for monitoring of warfarin treatment.    Indication: atrial fibrillation CHA2DS2 Vasc Score 4 (Age >81, female, HTN hx), HAS-BLED 2 (Age>65, renal disease)  Duration: indefinite Supervising physician: Adrian Prows  Anticoagulation Clinic Visit History:  Patient does report signs/symptoms of bleeding or thromboembolism.  Reports no changes in diet, medications, or lifestyle.   Reports having periods of worsening lower extremity swellings. BP readings have improved from amlodipine 10 mg. Pt to decrease amlodipine to 5 mg and continue monitoring BP readings at home. Denies any changes in her appetite.   Pt interested in home INR monitoring due to transportation concerns. Referral to [facility] sent.   Anticoagulation Episode Summary     Current INR goal:  2.0-3.0  TTR:  66.1 % (2.4 y)  Next INR check:  03/05/2021  INR from last check:  3.3 (02/14/2021)  Weekly max warfarin dose:    Target end date:  Indefinite  INR check location:    Preferred lab:    Send INR reminders to:     Indications   Paroxysmal atrial fibrillation (HCC) [I48.0] Monitoring for long-term anticoagulant use [Z51.81 Z79.01]        Comments:           Allergies  Allergen Reactions   Latex Rash   Penicillins Other (See Comments)    Yeast infection / Childhood   Sulfa Antibiotics Rash   Tape Other (See Comments)    Plastic, silicone, and paper tape causes bruising and pulls off skin. Cloth tape works fine    Current Outpatient Medications:    albuterol (PROVENTIL HFA;VENTOLIN HFA) 108 (90 Base) MCG/ACT inhaler, Inhale 1 puff into the lungs every 6 (six) hours as needed for wheezing or shortness of breath., Disp: 6.7 g, Rfl: 2   amiodarone (PACERONE) 200 MG tablet, Take 1 tablet (200 mg total) by mouth daily., Disp: 90 tablet, Rfl: 1   amLODipine (NORVASC) 10 MG tablet, Take 1 tablet (10 mg total) by mouth daily., Disp: 90  tablet, Rfl: 3   B Complex-C-Zn-Folic Acid (DIALYVITE 536 WITH ZINC) 0.8 MG TABS, Take 1 tablet by mouth daily., Disp: , Rfl:    carvedilol (COREG) 25 MG tablet, Take 1.5 tablets (37.5 mg total) by mouth 2 (two) times daily with a meal., Disp: 240 tablet, Rfl: 3   diltiazem (CARDIZEM) 60 MG tablet, Take 1 tablet (60 mg total) by mouth 3 (three) times daily as needed (Atrial fibrillation)., Disp: 30 tablet, Rfl: 2   DULoxetine (CYMBALTA) 20 MG capsule, Take 2 capsules (40 mg total) by mouth daily., Disp: 180 capsule, Rfl: 2   ethyl chloride spray, Apply 1 application topically every Monday, Wednesday, and Friday with hemodialysis., Disp: , Rfl: 12   hydrALAZINE (APRESOLINE) 25 MG tablet, Take 1 tablet (25 mg total) by mouth 3 (three) times daily., Disp: 270 tablet, Rfl: 3   isosorbide dinitrate (ISORDIL) 30 MG tablet, Take 1 tablet (30 mg total) by mouth 3 (three) times daily., Disp: 270 tablet, Rfl: 3   LINZESS 145 MCG CAPS capsule, TAKE 1 CAPSULE BY MOUTH ONCE DAILY BEFORE BREAKFAST, Disp: 30 capsule, Rfl: 0   Methoxy PEG-Epoetin Beta (MIRCERA IJ), Mircera, Disp: , Rfl:    omeprazole (PRILOSEC) 40 MG capsule, TAKE 1 CAPSULE BY MOUTH TWICE DAILY BEFORE A MEAL, Disp: 60 capsule, Rfl: 0   warfarin (COUMADIN) 5 MG tablet, USE AS DIRECTED BY COUMADIN CLINIC (Patient taking differently: USE AS DIRECTED  BY COUMADIN CLINIC  Currently on: 5 mg every Mon, Wed, Fri; 2.5 mg all other days), Disp: 113 tablet, Rfl: 5 Past Medical History:  Diagnosis Date   Acid reflux    Anemia of chronic disease    Arthritis    Asthma    Atrial fibrillation (HCC)    Bilateral carotid bruits    Complication of anesthesia    "hard to wake up, I have sleep apnea" no CPAP   Depression    Diverticulitis    Duodenal ulcer    Dysrhythmia    Afib   ESRD (end stage renal disease) ([place])    MWF [place]   Headache    History of blood transfusion    Hypertension    Malaise and fatigue    Orthostatic hypotension     Shortness of breath    " when I walk to fast"   Sleep apnea    Syncope    Tubulovillous adenoma of colon    ASSESSMENT  Recent Results: The most recent result is correlated with 27.5 mg per week:  Lab Results  Component Value Date   INR 3.3 (A) 02/14/2021   INR 1.2 (A) 02/07/2021   INR 1.3 (H) 02/03/2021    Anticoagulation Dosing: Description   INR above goal. Decrease weekly dose to 5 mg every Mon, Wed, Fri and 2.5 mg all other days. Recheck INR in 3 weeks.       INR today: Supratherapeutic. INR above goal following returning to previous baseline home dose after recent EGD procedure. Pt was previously supratheraputic on current weekly dose prior to the procedure. Pt denies any bleeding or bruising symptoms. Denies any extra doses, changes in diet, changes in lifestyle. Recent swelling improvement also possibly contributing to labile INR reading. Will decrease weekly dose in setting of persistently elevated INR on current maintenance dose and continue close monitoring.     PLAN Weekly dose was decreased by 9.1 % to 25 mg/week. Decrease weekly dose to 5 mg every Mon, Wed, Fri, and 2.5 mg all other days. Recheck INR in 3 weeks.     Patient Instructions  INR above goal. Decrease weekly dose to 5 mg every Mon, Wed, Fri and 2.5 mg all other days. Recheck INR in 3 weeks.  Patient advised to contact clinic or seek medical attention if signs/symptoms of bleeding or thromboembolism occur.  Patient verbalized understanding by repeating back information and was advised to contact me if further medication-related questions arise.   Follow-up Return in about 19 days (around 03/05/2021).  [name], PharmD  15 minutes spent face-to-face with the patient during the encounter. 50% of time spent on education, including signs/sx bleeding and clotting, as well as food and drug interactions with warfarin. 50% of time was spent on fingerprick POC INR sample collection,processing, results  determination, and documentation

## 2021-02-15 DIAGNOSIS — Z992 Dependence on renal dialysis: Secondary | ICD-10-CM | POA: Diagnosis not present

## 2021-02-15 DIAGNOSIS — N186 End stage renal disease: Secondary | ICD-10-CM | POA: Diagnosis not present

## 2021-02-15 DIAGNOSIS — D631 Anemia in chronic kidney disease: Secondary | ICD-10-CM | POA: Diagnosis not present

## 2021-02-15 DIAGNOSIS — N2581 Secondary hyperparathyroidism of renal origin: Secondary | ICD-10-CM | POA: Diagnosis not present

## 2021-02-15 NOTE — Patient Instructions (Signed)
INR above goal. Decrease weekly dose to 5 mg every Mon, Wed, Fri and 2.5 mg all other days. Recheck INR in 3 weeks.

## 2021-02-18 ENCOUNTER — Other Ambulatory Visit: Payer: Self-pay | Admitting: Gastroenterology

## 2021-02-18 DIAGNOSIS — N186 End stage renal disease: Secondary | ICD-10-CM | POA: Diagnosis not present

## 2021-02-18 DIAGNOSIS — I158 Other secondary hypertension: Secondary | ICD-10-CM | POA: Diagnosis not present

## 2021-02-18 DIAGNOSIS — N2581 Secondary hyperparathyroidism of renal origin: Secondary | ICD-10-CM | POA: Diagnosis not present

## 2021-02-18 DIAGNOSIS — Z992 Dependence on renal dialysis: Secondary | ICD-10-CM | POA: Diagnosis not present

## 2021-02-18 DIAGNOSIS — D631 Anemia in chronic kidney disease: Secondary | ICD-10-CM | POA: Diagnosis not present

## 2021-02-20 DIAGNOSIS — D631 Anemia in chronic kidney disease: Secondary | ICD-10-CM | POA: Diagnosis not present

## 2021-02-20 DIAGNOSIS — N186 End stage renal disease: Secondary | ICD-10-CM | POA: Diagnosis not present

## 2021-02-20 DIAGNOSIS — Z992 Dependence on renal dialysis: Secondary | ICD-10-CM | POA: Diagnosis not present

## 2021-02-20 DIAGNOSIS — N2581 Secondary hyperparathyroidism of renal origin: Secondary | ICD-10-CM | POA: Diagnosis not present

## 2021-02-21 ENCOUNTER — Ambulatory Visit (INDEPENDENT_AMBULATORY_CARE_PROVIDER_SITE_OTHER): Payer: Medicare Other | Admitting: Podiatry

## 2021-02-21 ENCOUNTER — Ambulatory Visit: Payer: Medicare Other | Admitting: Pharmacist

## 2021-02-21 ENCOUNTER — Other Ambulatory Visit: Payer: Self-pay

## 2021-02-21 DIAGNOSIS — Z5181 Encounter for therapeutic drug level monitoring: Secondary | ICD-10-CM

## 2021-02-21 DIAGNOSIS — I480 Paroxysmal atrial fibrillation: Secondary | ICD-10-CM

## 2021-02-21 DIAGNOSIS — M76822 Posterior tibial tendinitis, left leg: Secondary | ICD-10-CM | POA: Diagnosis not present

## 2021-02-21 DIAGNOSIS — Z7901 Long term (current) use of anticoagulants: Secondary | ICD-10-CM

## 2021-02-21 DIAGNOSIS — M722 Plantar fascial fibromatosis: Secondary | ICD-10-CM

## 2021-02-21 DIAGNOSIS — M76821 Posterior tibial tendinitis, right leg: Secondary | ICD-10-CM | POA: Diagnosis not present

## 2021-02-21 LAB — POCT INR: INR: 3.6 — AB (ref 2.0–3.0)

## 2021-02-21 NOTE — Patient Instructions (Signed)
INR above goal. HOLD today, then decrease weekly dose to 5 mg every Mon, Wed and 2.5 mg all other days. Recheck INR in 2 weeks.

## 2021-02-21 NOTE — Progress Notes (Signed)
Anticoagulation Management Jane [name] is a 75 y.o. female who reports to the clinic for monitoring of warfarin treatment.    Indication: atrial fibrillation CHA2DS2 Vasc Score 4 (Age >49, female, HTN hx), HAS-BLED 2 (Age>65, renal disease)  Duration: indefinite Supervising physician: Adrian Prows  Anticoagulation Clinic Visit History:  Patient does report signs/symptoms of bleeding or thromboembolism.  Reports no changes in diet, medications, or lifestyle.   Reports that lower extremity swellings are improving following following decreasing amlodipine dose to 5 mg. Home SBP readings continues to remain <140. Pt reports that she had recent MRI of her feet and was told that there were prominent inflammation concerns that might be contributing to her lower extremity pain concerns. MRI significant for mild tendinosis, subcortical marrow edema, moderate osteoarthritis, and large ankle joint effusion. Pt was referred to PCP and pain management for further evaluation.   Pt interested in home INR monitoring due to transportation concerns. Referral to [facility] sent. Referral pending   Anticoagulation Episode Summary     Current INR goal:  2.0-3.0  TTR:  65.6 % (2.4 y)  Next INR check:  03/05/2021  INR from last check:  3.6 (02/21/2021)  Weekly max warfarin dose:    Target end date:  Indefinite  INR check location:    Preferred lab:    Send INR reminders to:     Indications   Paroxysmal atrial fibrillation (HCC) [I48.0] Monitoring for long-term anticoagulant use [Z51.81 Z79.01]        Comments:           Allergies  Allergen Reactions   Latex Rash   Penicillins Other (See Comments)    Yeast infection / Childhood   Sulfa Antibiotics Rash   Tape Other (See Comments)    Plastic, silicone, and paper tape causes bruising and pulls off skin. Cloth tape works fine    Current Outpatient Medications:    albuterol (PROVENTIL HFA;VENTOLIN HFA) 108 (90 Base) MCG/ACT inhaler, Inhale 1 puff into  the lungs every 6 (six) hours as needed for wheezing or shortness of breath., Disp: 6.7 g, Rfl: 2   amiodarone (PACERONE) 200 MG tablet, Take 1 tablet (200 mg total) by mouth daily., Disp: 90 tablet, Rfl: 1   amLODipine (NORVASC) 10 MG tablet, Take 1 tablet (10 mg total) by mouth daily., Disp: 90 tablet, Rfl: 3   B Complex-C-Zn-Folic Acid (DIALYVITE 762 WITH ZINC) 0.8 MG TABS, Take 1 tablet by mouth daily., Disp: , Rfl:    carvedilol (COREG) 25 MG tablet, Take 1.5 tablets (37.5 mg total) by mouth 2 (two) times daily with a meal., Disp: 240 tablet, Rfl: 3   diltiazem (CARDIZEM) 60 MG tablet, Take 1 tablet (60 mg total) by mouth 3 (three) times daily as needed (Atrial fibrillation)., Disp: 30 tablet, Rfl: 2   DULoxetine (CYMBALTA) 20 MG capsule, Take 2 capsules (40 mg total) by mouth daily., Disp: 180 capsule, Rfl: 2   ethyl chloride spray, Apply 1 application topically every Monday, Wednesday, and Friday with hemodialysis., Disp: , Rfl: 12   hydrALAZINE (APRESOLINE) 25 MG tablet, Take 1 tablet (25 mg total) by mouth 3 (three) times daily., Disp: 270 tablet, Rfl: 3   isosorbide dinitrate (ISORDIL) 30 MG tablet, Take 1 tablet (30 mg total) by mouth 3 (three) times daily., Disp: 270 tablet, Rfl: 3   LINZESS 145 MCG CAPS capsule, TAKE 1 CAPSULE BY MOUTH ONCE DAILY BEFORE BREAKFAST, Disp: 30 capsule, Rfl: 0   Methoxy PEG-Epoetin Beta (MIRCERA IJ), Mircera, Disp: ,  Rfl:    omeprazole (PRILOSEC) 40 MG capsule, TAKE 1 CAPSULE BY MOUTH TWICE DAILY BEFORE A MEAL, Disp: 60 capsule, Rfl: 0   warfarin (COUMADIN) 5 MG tablet, USE AS DIRECTED BY COUMADIN CLINIC (Patient taking differently: USE AS DIRECTED BY COUMADIN CLINIC  Currently on: 5 mg every Mon, Wed; 2.5 mg all other days), Disp: 113 tablet, Rfl: 5 Past Medical History:  Diagnosis Date   Acid reflux    Anemia of chronic disease    Arthritis    Asthma    Atrial fibrillation (HCC)    Bilateral carotid bruits    Complication of anesthesia    "hard to  wake up, I have sleep apnea" no CPAP   Depression    Diverticulitis    Duodenal ulcer    Dysrhythmia    Afib   ESRD (end stage renal disease) ([place])    MWF [place]   Headache    History of blood transfusion    Hypertension    Malaise and fatigue    Orthostatic hypotension    Shortness of breath    " when I walk to fast"   Sleep apnea    Syncope    Tubulovillous adenoma of colon    ASSESSMENT  Recent Results: The most recent result is correlated with 25 mg per week:  Lab Results  Component Value Date   INR 3.6 (A) 02/21/2021   INR 3.3 (A) 02/14/2021   INR 1.2 (A) 02/07/2021    Anticoagulation Dosing: Description   INR above goal. HOLD today, then decrease weekly dose to 5 mg every Mon, Wed and 2.5 mg all other days. Recheck INR in 2 weeks.       INR today: Supratherapeutic. INR continues to trend up despite recent weekly dose decrease. Pt verbalized prescribed warfarin dose and confirmed staying adherent and compliant. Pt denies any recent extra doses, cranberry, grapefruit, or cranberry intake, recent EtOH intake, changes in medications that might be contributing to the persistent supratheraputic INR readings. Denies any bleeding or bruising symptoms. Will continue further dose titration and close monitoring to ensure INR returns to be within therapeutic range.    PLAN Weekly dose was decreased by 10 % to 22.5 mg/week. HOLD today and then decrease weekly dose to 5 mg every Mon, Wed and 2.5 mg all other days. Recheck INR in 2 weeks.     Patient Instructions  INR above goal. HOLD today, then decrease weekly dose to 5 mg every Mon, Wed and 2.5 mg all other days. Recheck INR in 2 weeks.  Patient advised to contact clinic or seek medical attention if signs/symptoms of bleeding or thromboembolism occur.  Patient verbalized understanding by repeating back information and was advised to contact me if further medication-related questions arise.   Follow-up Return in  about 12 days (around 03/05/2021).  [name], PharmD  15 minutes spent face-to-face with the patient during the encounter. 50% of time spent on education, including signs/sx bleeding and clotting, as well as food and drug interactions with warfarin. 50% of time was spent on fingerprick POC INR sample collection,processing, results determination, and documentation

## 2021-02-22 DIAGNOSIS — I480 Paroxysmal atrial fibrillation: Secondary | ICD-10-CM | POA: Diagnosis not present

## 2021-02-22 DIAGNOSIS — I10 Essential (primary) hypertension: Secondary | ICD-10-CM | POA: Diagnosis not present

## 2021-02-22 DIAGNOSIS — N2581 Secondary hyperparathyroidism of renal origin: Secondary | ICD-10-CM | POA: Diagnosis not present

## 2021-02-22 DIAGNOSIS — N186 End stage renal disease: Secondary | ICD-10-CM | POA: Diagnosis not present

## 2021-02-22 DIAGNOSIS — Z992 Dependence on renal dialysis: Secondary | ICD-10-CM | POA: Diagnosis not present

## 2021-02-22 DIAGNOSIS — D631 Anemia in chronic kidney disease: Secondary | ICD-10-CM | POA: Diagnosis not present

## 2021-02-25 ENCOUNTER — Encounter: Payer: Self-pay | Admitting: Podiatry

## 2021-02-25 DIAGNOSIS — Z992 Dependence on renal dialysis: Secondary | ICD-10-CM | POA: Diagnosis not present

## 2021-02-25 DIAGNOSIS — N2581 Secondary hyperparathyroidism of renal origin: Secondary | ICD-10-CM | POA: Diagnosis not present

## 2021-02-25 DIAGNOSIS — D631 Anemia in chronic kidney disease: Secondary | ICD-10-CM | POA: Diagnosis not present

## 2021-02-25 DIAGNOSIS — N186 End stage renal disease: Secondary | ICD-10-CM | POA: Diagnosis not present

## 2021-02-25 NOTE — Progress Notes (Signed)
  Subjective:  [name], female    DOB: 1945-09-16,  MRN: [id number]  Chief Complaint  Patient presents with   Tendonitis     after MRI to review    75 y.o. female returns for follow-up with the above complaint. History confirmed with patient.  Has had some improvement with rest.  She is having back trouble and is undergoing treatment for this soon  Objective:  Physical Exam: warm, good capillary refill, no trophic changes or ulcerative lesions, normal DP and PT pulses and normal sensory exam. Left Foot: Pain on palpation at the insertion of the posterior tibial tendon on the navicular and with resisted inversion Right Foot: No pain    Radiographs: X-ray of both feet reviewed: no fracture, dislocation, swelling or degenerative changes noted  Study Result  Narrative & Impression  CLINICAL DATA:  Left ankle pain and swelling.  No known injury.   EXAM: MRI OF THE LEFT ANKLE WITHOUT CONTRAST   TECHNIQUE: Multiplanar, multisequence MR imaging of the ankle was performed. No intravenous contrast was administered.   COMPARISON:  None.   FINDINGS: TENDONS   Peroneal: Mild tendinosis of the peroneus longus with mild tenosynovitis. Peroneal brevis intact.   Posteromedial: Posterior tibial tendon intact with severe subcortical marrow edema at the navicular insertion. Flexor hallucis longus tendon intact. Flexor digitorum longus tendon intact.   Anterior: Tibialis anterior tendon intact. Extensor hallucis longus tendon intact Extensor digitorum longus tendon intact.   Achilles: Intact. Small amount of fluid in the retrocalcaneal bursa.   Plantar Fascia: Intact.   LIGAMENTS   Lateral: Anterior talofibular ligament intact. Calcaneofibular ligament intact. Posterior talofibular ligament intact. Anterior and posterior tibiofibular ligaments intact.   Medial: Deltoid ligament intact. Spring ligament intact.   CARTILAGE   Ankle Joint: Large ankle joint  effusion. Normal ankle mortise. No chondral defect.   Subtalar Joints/Sinus Tarsi: Normal subtalar joints. No subtalar joint effusion. Normal sinus tarsi.   Bones: No marrow signal abnormality. No fracture or dislocation. Moderate osteoarthritis of the second tarsometatarsal joint.   Soft Tissue: No fluid collection or hematoma. Muscles are normal without edema or atrophy. Tarsal tunnel is normal. Generalized soft tissue edema around the ankle.   IMPRESSION: 1. Mild tendinosis of the peroneus longus with mild tenosynovitis. 2. Posterior tibial tendon intact with severe subcortical marrow edema at the navicular insertion. 3. Moderate osteoarthritis of the second tarsometatarsal joint. 4. Large ankle joint effusion.     Electronically Signed   By: Kathreen Devoid   On: 02/08/2021 11:30   Assessment:   1. Posterior tibial tendinitis of left lower extremity   2. Plantar fasciitis of right foot   3. Posterior tibial tendinitis of right lower extremity       Plan:  Patient was evaluated and treated and all questions answered.   Reviewed the MRI findings with her.  We discussed surgical and nonsurgical treatment.  Surgically I think she would need the very least a Kidner procedure and possible hindfoot fusion to stabilize the hindfoot.  Alternatively we discussed bracing with a Richie brace.  She feels that she has made some improvements and she is in regular shoe gear today.  I will see her back in 1 month and we will determine if she would like to proceed with having a Richie brace made  Return in about 1 month (around 03/24/2021) for re-check PT tendon, consider brace.

## 2021-02-26 ENCOUNTER — Ambulatory Visit: Payer: Medicare Other | Admitting: Podiatry

## 2021-02-27 DIAGNOSIS — N186 End stage renal disease: Secondary | ICD-10-CM | POA: Diagnosis not present

## 2021-02-27 DIAGNOSIS — N2581 Secondary hyperparathyroidism of renal origin: Secondary | ICD-10-CM | POA: Diagnosis not present

## 2021-02-27 DIAGNOSIS — I4891 Unspecified atrial fibrillation: Secondary | ICD-10-CM | POA: Diagnosis not present

## 2021-02-27 DIAGNOSIS — D631 Anemia in chronic kidney disease: Secondary | ICD-10-CM | POA: Diagnosis not present

## 2021-02-27 DIAGNOSIS — Z992 Dependence on renal dialysis: Secondary | ICD-10-CM | POA: Diagnosis not present

## 2021-02-28 ENCOUNTER — Encounter: Payer: Self-pay | Admitting: Physical Medicine & Rehabilitation

## 2021-02-28 ENCOUNTER — Other Ambulatory Visit: Payer: Self-pay

## 2021-02-28 ENCOUNTER — Encounter: Payer: Medicare Other | Attending: Physical Medicine & Rehabilitation | Admitting: Physical Medicine & Rehabilitation

## 2021-02-28 VITALS — BP 155/73 | HR 78 | Temp 98.4°F | Ht 65.0 in | Wt 203.8 lb

## 2021-02-28 DIAGNOSIS — I6523 Occlusion and stenosis of bilateral carotid arteries: Secondary | ICD-10-CM | POA: Diagnosis not present

## 2021-02-28 DIAGNOSIS — M533 Sacrococcygeal disorders, not elsewhere classified: Secondary | ICD-10-CM | POA: Diagnosis not present

## 2021-02-28 DIAGNOSIS — M961 Postlaminectomy syndrome, not elsewhere classified: Secondary | ICD-10-CM | POA: Insufficient documentation

## 2021-02-28 MED ORDER — TRAMADOL HCL 50 MG PO TABS: 50.0000 mg | ORAL_TABLET | Freq: Three times a day (TID) | ORAL | 1 refills | Status: DC | PRN

## 2021-02-28 NOTE — Patient Instructions (Signed)
Sacroiliac Joint Dysfunction °Sacroiliac joint dysfunction is a condition that causes inflammation on one or both sides of the sacroiliac (SI) joint. The SI joint is the joint between two bones of the pelvis called the sacrum and the ilium. The sacrum is the bone at the base of the spine. The ilium is the large bone that forms the hip. This condition causes deep aching or burning pain in the low back. In some cases, the pain may also spread into one or both buttocks, hips, or thighs. °What are the causes? °This condition may be caused by: °Pregnancy. During pregnancy, extra stress is put on the SI joints because the pelvis widens. °Injury, such as: °Injuries from car crashes. °Sports-related injuries. °Work-related injuries. °Having one leg that is shorter than the other. °Conditions that affect the joints, such as: °Rheumatoid arthritis. °Gout. °Psoriatic arthritis. °Joint infection (septic arthritis). °Sometimes, the cause of SI joint dysfunction is not known. °What are the signs or symptoms? °Symptoms of this condition include: °Aching or burning pain in the lower back. The pain may also spread to other areas, such as: °Buttocks. °Groin. °Thighs. °Muscle spasms in or around the painful areas. °Increased pain when standing, walking, running, stair climbing, bending, or lifting. °How is this diagnosed? °This condition is diagnosed with a physical exam and your medical history. During the exam, the health care provider may move one or both of your legs to different positions to check for pain. Various tests may be done to confirm the diagnosis, including: °Imaging tests to look for other causes of pain. These may include: °MRI. °CT scan. °Bone scan. °Diagnostic injection. A numbing medicine is injected into the SI joint using a needle. If your pain is temporarily improved or stopped after the injection, this can indicate that SI joint dysfunction is the problem. °How is this treated? °Treatment depends on the cause  and severity of your condition. Treatment options can be noninvasive and may include: °Ice or heat applied to the lower back area after an injury. This may help reduce pain and muscle spasms. °Medicines to relieve pain or inflammation or to relax the muscles. °Wearing a back brace (sacroiliac brace) to help support the joint while your back is healing. °Physical therapy to increase muscle strength around the joint and flexibility at the joint. This may also involve learning proper body positions and ways of moving to relieve stress on the joint. °Direct manipulation of the SI joint. °Use of a device that provides electrical stimulation to help reduce pain at the joint. °Other treatments may include: °Injections of steroid medicine into the joint to reduce pain and swelling. °Radiofrequency ablation. This treatment uses heat to burn away nerves that are carrying pain messages from the joint. °Surgery to put in screws and plates that limit or prevent joint motion. This is rare. °Follow these instructions at home: °Medicines °Take over-the-counter and prescription medicines only as told by your health care provider. °Ask your health care provider if the medicine prescribed to you: °Requires you to avoid driving or using machinery. °Can cause constipation. You may need to take these actions to prevent or treat constipation: °Drink enough fluid to keep your urine pale yellow. °Take over-the-counter or prescription medicines. °Eat foods that are high in fiber, such as beans, whole grains, and fresh fruits and vegetables. °Limit foods that are high in fat and processed sugars, such as fried or sweet foods. °If you have a brace: °Wear the brace as told by your health care provider. Remove   it only as told by your health care provider. °Keep the brace clean. °If the brace is not waterproof: °Do not let it get wet. °Cover it with a watertight covering when you take a bath or a shower. °Managing pain, stiffness, and swelling °   °Icing can help with pain and swelling. Heat may help with muscle tension or spasms. Ask your health care provider if you should use ice or heat. °If directed, put ice on the affected area: °If you have a removable brace, remove it as told by your health care provider. °Put ice in a plastic bag. °Place a towel between your skin and the bag. °Leave the ice on for 20 minutes, 2-3 times a day. °Remove the ice if your skin turns bright red. This is very important. If you cannot feel pain, heat, or cold, you have a greater risk of damage to the area. °If directed, apply heat to the affected area as often as told by your health care provider. Use the heat source that your health care provider recommends, such as a moist heat pack or a heating pad. °Place a towel between your skin and the heat source. °Leave the heat on for 20-30 minutes. °Remove the heat if your skin turns bright red. This is especially important if you are unable to feel pain, heat, or cold. You may have a greater risk of getting burned. °General instructions °Rest as needed. Return to your normal activities as told by your health care provider. Ask your health care provider what activities are safe for you. °Do exercises as told by your health care provider or physical therapist. °Keep all follow-up visits. This is important. °Contact a health care provider if: °Your pain is not controlled with medicine. °You have a fever. °Your pain is getting worse. °Get help right away if: °You have weakness, numbness, or tingling in your legs or feet. °You lose control of your bladder or bowels. °Summary °Sacroiliac (SI) joint dysfunction is a condition that causes inflammation on one or both sides of the SI joint. °This condition causes deep aching or burning pain in the low back. In some cases, the pain may also spread into one or both buttocks, hips, or thighs. °Treatment depends on the cause and severity of your condition. It may include medicines to reduce  pain and swelling or to relax muscles. °This information is not intended to replace advice given to you by your health care provider. Make sure you discuss any questions you have with your health care provider. °Document Revised: 11/17/2019 Document Reviewed: 11/17/2019 °Elsevier Patient Education © 2022 [employer] ° °

## 2021-02-28 NOTE — Progress Notes (Signed)
Subjective:    [name], female    DOB: [birth year], 75 y.o.   MRN: [id number]  HPI CC:  Problems with walking as well as falling  75 year old female with atrial fibrillation on Coumadin referred by primary care for evaluation of back pain as well as lower extremity pain.  She rates her pain is 9-10 out of 10 sharp dull constant aching.  Interferes with general activity and enjoyment of life.  Worse with walking bending sitting standing She can walk 3 to 5 minutes she does not have a walker with her today but states he has occasionally used 1.  She has some difficulty with meal prep household duties and shopping.  She no longer drives because she gave her car to one of her kids.  She is retired was last employed in 2012 Pain management She has tried heat and ice as well as TENS unit.  She uses 3 Tylenol per day. Exercise no regular exercise Has been on hydrocodone and duloxetine Hx ESRD Left TKR  Right knee "needs to be replaced  Hx L4-5 fusion Has been seen for left posterior tibial tendinitis by podiatry. Tried boot and splint for Left foot and ankle Tried PT for foot and ankle "made it worse"  Pain Inventory Average Pain 10 Pain Right Now 9 My pain is constant, sharp, dull, and aching  In the last 24 hours, has pain interfered with the following? General activity 9 Relation with others 10 Enjoyment of life 9 What TIME of day is your pain at its worst? morning , daytime, and evening Sleep (in general) Poor  Pain is worse with: walking, bending, sitting, and standing Pain improves with: medication and injections Relief from Meds: 3  walk with assistance use a walker how many minutes can you walk? 3-5  ( have problem with balance) ability to climb steps?  no do you drive?  no  retired I need assistance with the following:  meal prep, household duties, and shopping Do you have any goals in this area?  yes  trouble walking  Any changes since last visit?   yes CT/MRI Left foot & left ankle by [facility] CLINICAL DATA:  Low back pain and left buttock pain. Pain extends to the left leg. Previous surgery 2016.   EXAM: MRI LUMBAR SPINE WITHOUT CONTRAST   TECHNIQUE: Multiplanar, multisequence MR imaging of the lumbar spine was performed. No intravenous contrast was administered.   COMPARISON:  CT abdomen 08/04/2016   FINDINGS: Segmentation:  5 lumbar type vertebral bodies.   Alignment:  Curvature convex to the right with the apex at L3.   Vertebrae: No fracture or primary lesion. Endplate Schmorl's nodes without pronounced edema.   Conus medullaris: Extends to the L1 level and appears normal.   Paraspinal and other soft tissues: Negative except for renal cysts.   Disc levels:   T11-12 and T12-L1:  Mild bulging of the discs.  No stenosis.   L1-2:  Normal interspace.   L2-3: Mild bulging of the disc. Mild facet and ligamentous hypertrophy. No compressive stenosis.   L3-4: Disc degeneration with bulging of the disc. Facet and ligamentous hypertrophy. Mild narrowing of the lateral recesses without visible neural compression.   L4-5: Previous discectomy and fusion procedure. Cannot establish if there is solid union. Based on the CT of January, there may be nonunion at this level. There is mild stenosis of the lateral recesses and foramina without definite neural compression.   L5-S1:  Endplate osteophytes mild bulging of the disc. Mild facet hypertrophy. No apparent compressive stenosis.   IMPRESSION: Previous discectomy and fusion procedure at L4-5. Based on today's study and on the previous CT, there may be nonunion at this level. Consider dedicated lumbar evaluation with CT.   Degenerative changes at the other levels as outlined above, but without advanced disease or definitely significant stenosis.     Electronically Signed   By: Nelson Chimes M.D.   On: 12/20/2016 09:02  CLINICAL DATA:  Left ankle pain and  swelling.  No known injury.   EXAM: MRI OF THE LEFT ANKLE WITHOUT CONTRAST   TECHNIQUE: Multiplanar, multisequence MR imaging of the ankle was performed. No intravenous contrast was administered.   COMPARISON:  None.   FINDINGS: TENDONS   Peroneal: Mild tendinosis of the peroneus longus with mild tenosynovitis. Peroneal brevis intact.   Posteromedial: Posterior tibial tendon intact with severe subcortical marrow edema at the navicular insertion. Flexor hallucis longus tendon intact. Flexor digitorum longus tendon intact.   Anterior: Tibialis anterior tendon intact. Extensor hallucis longus tendon intact Extensor digitorum longus tendon intact.   Achilles: Intact. Small amount of fluid in the retrocalcaneal bursa.   Plantar Fascia: Intact.   LIGAMENTS   Lateral: Anterior talofibular ligament intact. Calcaneofibular ligament intact. Posterior talofibular ligament intact. Anterior and posterior tibiofibular ligaments intact.   Medial: Deltoid ligament intact. Spring ligament intact.   CARTILAGE   Ankle Joint: Large ankle joint effusion. Normal ankle mortise. No chondral defect.   Subtalar Joints/Sinus Tarsi: Normal subtalar joints. No subtalar joint effusion. Normal sinus tarsi.   Bones: No marrow signal abnormality. No fracture or dislocation. Moderate osteoarthritis of the second tarsometatarsal joint.   Soft Tissue: No fluid collection or hematoma. Muscles are normal without edema or atrophy. Tarsal tunnel is normal. Generalized soft tissue edema around the ankle.   IMPRESSION: 1. Mild tendinosis of the peroneus longus with mild tenosynovitis. 2. Posterior tibial tendon intact with severe subcortical marrow edema at the navicular insertion. 3. Moderate osteoarthritis of the second tarsometatarsal joint. 4. Large ankle joint effusion.     Electronically Signed   By: Kathreen Devoid   On: 02/08/2021 11:30 Any changes since last visit?  no New  Patient    Family History  Problem Relation Age of Onset   Heart failure Mother    Stroke Mother    Other Father    Colon cancer Neg Hx    Liver disease Neg Hx    Esophageal cancer Neg Hx    Stomach cancer Neg Hx    Inflammatory bowel disease Neg Hx    Rectal cancer Neg Hx    Pancreatic cancer Neg Hx    Social History   Socioeconomic History   Marital status: Divorced    Spouse name: Not on file   Number of children: 4   Years of education: 14   Highest education level: Associate degree: occupational, Hotel manager, or vocational program  Occupational History   Occupation: Retired  Tobacco Use   Smoking status: Never   Smokeless tobacco: Never  Vaping Use   Vaping Use: Never used  Substance and Sexual Activity   Alcohol use: No   Drug use: No   Sexual activity: Never  Other Topics Concern   Not on file  Social History Narrative   HH 1   Divorced   Outpatient dialysis Mon, Wed, Fri   4 children: 1 daughter locally is an Designer, multimedia and 3 sons in [place]  Social Determinants of Health   Financial Resource Strain: Low Risk    Difficulty of Paying Living Expenses: Not hard at all  Food Insecurity: No Food Insecurity   Worried About Charity fundraiser in the Last Year: Never true   [place] in the Last Year: Never true  Transportation Needs: No Transportation Needs   Lack of Transportation (Medical): No   Lack of Transportation (Non-Medical): No  Physical Activity: Inactive   Days of Exercise per Week: 0 days   Minutes of Exercise per Session: 0 min  Stress: Stress Concern Present   Feeling of Stress : To some extent  Social Connections: Moderately Isolated   Frequency of Communication with Friends and Family: More than three times a week   Frequency of Social Gatherings with Friends and Family: Never   Attends Religious Services: More than 4 times per year   Active Member of Clubs or Organizations: No   Attends Archivist Meetings: Never    Marital Status: Divorced   Past Surgical History:  Procedure Laterality Date   A/V FISTULAGRAM Right 10/18/2020   Procedure: A/V FISTULAGRAM;  Surgeon: Marty Heck, MD;  Location: [facility];  Service: Cardiovascular;  Laterality: Right;   AV FISTULA PLACEMENT     BACK SURGERY     Lumbar fusion L 4 and L 5   BIOPSY  01/09/2020   Procedure: BIOPSY;  Surgeon: Rush Landmark Telford Nab., MD;  Location: [place];  Service: Gastroenterology;;   BIOPSY  01/31/2021   Procedure: BIOPSY;  Surgeon: Irving Copas., MD;  Location: [place];  Service: Gastroenterology;;   COLONOSCOPY     COLONOSCOPY N/A 02/01/2021   Procedure: COLONOSCOPY;  Surgeon: Yetta Flock, MD;  Location: [place];  Service: Gastroenterology;  Laterality: N/A;   ENDOSCOPIC MUCOSAL RESECTION N/A 01/09/2020   Procedure: ENDOSCOPIC MUCOSAL RESECTION;  Surgeon: Rush Landmark Telford Nab., MD;  Location: [place];  Service: Gastroenterology;  Laterality: N/A;   ENDOSCOPIC MUCOSAL RESECTION N/A 01/31/2021   Procedure: ENDOSCOPIC MUCOSAL RESECTION;  Surgeon: Rush Landmark Telford Nab., MD;  Location: [place];  Service: Gastroenterology;  Laterality: N/A;   ESOPHAGOGASTRODUODENOSCOPY     ESOPHAGOGASTRODUODENOSCOPY (EGD) WITH PROPOFOL N/A 01/09/2020   Procedure: ESOPHAGOGASTRODUODENOSCOPY (EGD) WITH PROPOFOL;  Surgeon: Rush Landmark Telford Nab., MD;  Location: [place];  Service: Gastroenterology;  Laterality: N/A;   ESOPHAGOGASTRODUODENOSCOPY (EGD) WITH PROPOFOL N/A 01/13/2020   Procedure: ESOPHAGOGASTRODUODENOSCOPY (EGD) WITH PROPOFOL;  Surgeon: Irene Shipper, MD;  Location: [place];  Service: Gastroenterology;  Laterality: N/A;   ESOPHAGOGASTRODUODENOSCOPY (EGD) WITH PROPOFOL N/A 01/31/2021   Procedure: ESOPHAGOGASTRODUODENOSCOPY (EGD) WITH PROPOFOL;  Surgeon: Rush Landmark Telford Nab., MD;  Location: [place];  Service: Gastroenterology;  Laterality: N/A;   EUS N/A 01/09/2020   Procedure:  UPPER ENDOSCOPIC ULTRASOUND (EUS) RADIAL;  Surgeon: Irving Copas., MD;  Location: [place];  Service: Gastroenterology;  Laterality: N/A;   HEMOSTASIS CLIP PLACEMENT  01/09/2020   Procedure: HEMOSTASIS CLIP PLACEMENT;  Surgeon: Irving Copas., MD;  Location: [place];  Service: Gastroenterology;;   HEMOSTASIS CLIP PLACEMENT  01/31/2021   Procedure: HEMOSTASIS CLIP PLACEMENT;  Surgeon: Irving Copas., MD;  Location: [place];  Service: Gastroenterology;;   HEMOSTASIS CONTROL  01/13/2020   Procedure: HEMOSTASIS CONTROL;  Surgeon: Irene Shipper, MD;  Location: [facility] ENDOSCOPY;  Service: Gastroenterology;;  hemaspray   HOT HEMOSTASIS N/A 01/13/2020   Procedure: HOT HEMOSTASIS (ARGON PLASMA COAGULATION/BICAP);  Surgeon: Irene Shipper, MD;  Location: [place];  Service:  Gastroenterology;  Laterality: N/A;   HOT HEMOSTASIS N/A 02/01/2021   Procedure: HOT HEMOSTASIS (ARGON PLASMA COAGULATION/BICAP);  Surgeon: Yetta Flock, MD;  Location[facility] ENDOSCOPY;  Service: Gastroenterology;  Laterality: N/A;   IR GENERIC HISTORICAL  07/09/2016   IR US GUIDE VASC ACCESS RIGHT 07/09/2016 Arne Cleveland, MD [facility]   IR GENERIC HISTORICAL  07/09/2016   IR FLUORO GUIDE CV LINE RIGHT 07/09/2016 Arne Cleveland, MD [facility]   KNEE ARTHROPLASTY Left    LAPAROSCOPIC SIGMOID COLECTOMY N/A 07/11/2016   Procedure: LAPAROSCOPIC SIGMOID COLECTOMY;  Surgeon: Clovis Riley, MD;  Location: [place];  Service: General;  Laterality: N/A;   MASS EXCISION Right 04/10/2020   Procedure: EXCISION SKIN NODULE RIGHT FOREARM;  Surgeon: Waynetta Sandy, MD;  Location: [place];  Service: Vascular;  Laterality: Right;   SCLEROTHERAPY  01/09/2020   Procedure: Clide Deutscher;  Surgeon: Irving Copas., MD;  Location: [place];  Service: Gastroenterology;;   Clide Deutscher  01/13/2020   Procedure: Clide Deutscher;  Surgeon: Irene Shipper, MD;  Location[facility];  Service:  Gastroenterology;;   Maryagnes Amos INJECTION  01/09/2020   Procedure: SUBMUCOSAL LIFTING INJECTION;  Surgeon: Irving Copas., MD;  Location: [facility] ENDOSCOPY;  Service: Gastroenterology;;   TUBAL LIGATION     Past Medical History:  Diagnosis Date   Acid reflux    Anemia of chronic disease    Arthritis    Asthma    Atrial fibrillation ([place])    Bilateral carotid bruits    Complication of anesthesia    "hard to wake up, I have sleep apnea" no CPAP   Depression    Diverticulitis    Duodenal ulcer    Dysrhythmia    Afib   ESRD (end stage renal disease) ([place])    MWF [place]   Headache    History of blood transfusion    Hypertension    Malaise and fatigue    Orthostatic hypotension    Shortness of breath    " when I walk to fast"   Sleep apnea    Syncope    Tubulovillous adenoma of colon    BP (!) 155/73 Comment: pt stated she just took her bp medication  Pulse 78   Temp 98.4 F (36.9 C)   Ht 5\' 5"  (1.651 m)   Wt 203 lb 12.8 oz (92.4 kg)   SpO2 94%   BMI 33.91 kg/m   Opioid Risk Score:   Fall Risk Score:  `1  Depression screen PHQ 2/9  Depression screen [facility] 2/9 10/23/2020 07/31/2020 03/27/2020 03/27/2020 07/28/2019 09/25/2018 05/11/2018  Decreased Interest 0 0 1 1 1 1  0  Down, Depressed, Hopeless 0 1 0 0 2 1 0  PHQ - 2 Score 0 1 1 1 3 2  0  Altered sleeping 2 - 3 3 2 3  -  Tired, decreased energy 1 - 3 3 2 3  -  Change in appetite 1 - 1 - 2 3 -  Feeling bad or failure about yourself  0 - 0 - 0 0 -  Trouble concentrating 0 - 0 - 0 1 -  Moving slowly or fidgety/restless 0 - 0 - 0 0 -  Suicidal thoughts 0 - 0 - 0 1 -  PHQ-9 Score 4 - 8 7 9 13  -  Difficult doing work/chores Not difficult at all - Not difficult at all - Somewhat difficult Somewhat difficult -  Some recent data might be hidden    Review of Systems  Constitutional:  Negative for unexpected weight change.  Respiratory:  Positive for apnea.   Cardiovascular:  Positive for leg swelling.   Gastrointestinal:  Positive for abdominal pain, constipation and nausea.  Musculoskeletal:  Positive for back pain and gait problem.       Pain in both legs and hips  Neurological:  Positive for weakness and numbness.  Hematological:  Bruises/bleeds easily.      Objective:   Physical Exam Vitals and nursing note reviewed.  Constitutional:      Appearance: She is obese.  HENT:     Head: Normocephalic and atraumatic.     Mouth/Throat:     Mouth: Mucous membranes are moist.  Eyes:     Extraocular Movements: Extraocular movements intact.     Conjunctiva/sclera: Conjunctivae normal.     Pupils: Pupils are equal, round, and reactive to light.  Cardiovascular:     Rate and Rhythm: Normal rate and regular rhythm.     Heart sounds: Normal heart sounds.  Pulmonary:     Effort: Pulmonary effort is normal.     Breath sounds: Normal breath sounds.  Abdominal:     General: Abdomen is flat. Bowel sounds are normal. There is no distension.     Palpations: Abdomen is soft.  Musculoskeletal:     Cervical back: Normal range of motion.     Comments: Knee flexion is limited 90 degrees bilaterally There is no evidence of knee effusion bilaterally There is pain with endrange flexion no pain with extension.   No evidence of erythema.  No tenderness palpation along the joint  Lumbar spine tenderness left greater than right PSIS area Positive Faber's left PSIS area Positive Distraction test right PSIS Positive thrust test left greater than right  Skin:    General: Skin is warm and dry.  Neurological:     Mental Status: She is alert and oriented to person, place, and time.  Psychiatric:        Mood and Affect: Mood normal.        Behavior: Behavior normal.          Assessment & Plan:  1.  Chronic low back pain with lumbar postlaminectomy syndrome.  Her current complaints which has caused worsening of her mobility therapy stemming from the sacroiliac joint.  This occurs in 25 to 30% of  patients post lumbar fusion.  Would recommend left sacroiliac injection under fluoroscopic guidance. 2.  Bilateral knee pain with contracture status post TKR on left side and has been recommended TKR on the right side I do think she would benefit from aquatic therapy.  She also may be a candidate for genicular nerve blocks and if these produce a short-term relief genicular RF would be indicated. Until patient can have some interventional spine injection we will prescribe tramadol 50 mg 3 times daily as needed.  If this is needed for more than 6 weeks we will need to get a controlled substance agreement as well as UDS testing

## 2021-03-01 DIAGNOSIS — Z992 Dependence on renal dialysis: Secondary | ICD-10-CM | POA: Diagnosis not present

## 2021-03-01 DIAGNOSIS — N186 End stage renal disease: Secondary | ICD-10-CM | POA: Diagnosis not present

## 2021-03-01 DIAGNOSIS — N2581 Secondary hyperparathyroidism of renal origin: Secondary | ICD-10-CM | POA: Diagnosis not present

## 2021-03-01 DIAGNOSIS — D631 Anemia in chronic kidney disease: Secondary | ICD-10-CM | POA: Diagnosis not present

## 2021-03-04 DIAGNOSIS — Z992 Dependence on renal dialysis: Secondary | ICD-10-CM | POA: Diagnosis not present

## 2021-03-04 DIAGNOSIS — D631 Anemia in chronic kidney disease: Secondary | ICD-10-CM | POA: Diagnosis not present

## 2021-03-04 DIAGNOSIS — N2581 Secondary hyperparathyroidism of renal origin: Secondary | ICD-10-CM | POA: Diagnosis not present

## 2021-03-04 DIAGNOSIS — N186 End stage renal disease: Secondary | ICD-10-CM | POA: Diagnosis not present

## 2021-03-05 ENCOUNTER — Other Ambulatory Visit: Payer: Self-pay

## 2021-03-05 ENCOUNTER — Ambulatory Visit: Payer: Medicare Other | Admitting: Pharmacist

## 2021-03-05 DIAGNOSIS — I480 Paroxysmal atrial fibrillation: Secondary | ICD-10-CM

## 2021-03-05 DIAGNOSIS — Z5181 Encounter for therapeutic drug level monitoring: Secondary | ICD-10-CM | POA: Diagnosis not present

## 2021-03-05 DIAGNOSIS — Z7901 Long term (current) use of anticoagulants: Secondary | ICD-10-CM | POA: Diagnosis not present

## 2021-03-05 LAB — POCT INR: INR: 3.6 — AB (ref 2.0–3.0)

## 2021-03-05 NOTE — Progress Notes (Signed)
Anticoagulation Management Jane [name] is a 75 y.o. female who reports to the clinic for monitoring of warfarin treatment.    Indication: atrial fibrillation CHA2DS2 Vasc Score 4 (Age >55, female, HTN hx), HAS-BLED 2 (Age>65, renal disease)  Duration: indefinite Supervising physician: Adrian Prows  Anticoagulation Clinic Visit History:  Patient does report signs/symptoms of bleeding or thromboembolism.  Reports no changes in diet, medications, or lifestyle.   Pt reports that she was unsure about her home warfarin dose as previously discussed. Pt reports to have possibly been taking 7.5 mg instead of instructed dose of 2.5 mg. Pt reports that she was told that her INR during dialysis last week being ~5.6. Pt reports having mild bruising in her thighs.   Recent PMR OV for back pain and lower extremity pain.  Pt being considered for left sacroiliac injection and possible TKR. Pt being referred to aquatic therapy/physical therapy and was started on PRN tramadol in the meantime.   Pt reports that she was seen by PCP recently as well and was recommended to start on Augmentin for concerns of possibly sinus infection. Pt reports that she has been having productive cough over the past few days. Pt noted to have history of being more prone to yeast infection while taking Abx. Pt reports that she was also given prophylactic fluconazole in case she starts having concerns of yeast infection.   Anticoagulation Episode Summary     Current INR goal:  2.0-3.0  TTR:  64.7 % (2.4 y)  Next INR check:  03/14/2021  INR from last check:  3.6 (03/05/2021)  Weekly max warfarin dose:    Target end date:  Indefinite  INR check location:    Preferred lab:    Send INR reminders to:     Indications   Paroxysmal atrial fibrillation (HCC) [I48.0] Monitoring for long-term anticoagulant use [Z51.81 Z79.01]        Comments:           Allergies  Allergen Reactions   Latex Rash   Penicillins Other (See  Comments)    Yeast infection / Childhood   Sulfa Antibiotics Rash   Tape Other (See Comments)    Plastic, silicone, and paper tape causes bruising and pulls off skin. Cloth tape works fine    Current Outpatient Medications:    albuterol (PROVENTIL HFA;VENTOLIN HFA) 108 (90 Base) MCG/ACT inhaler, Inhale 1 puff into the lungs every 6 (six) hours as needed for wheezing or shortness of breath., Disp: 6.7 g, Rfl: 2   amiodarone (PACERONE) 200 MG tablet, Take 1 tablet (200 mg total) by mouth daily., Disp: 90 tablet, Rfl: 1   amLODipine (NORVASC) 10 MG tablet, Take 1 tablet (10 mg total) by mouth daily., Disp: 90 tablet, Rfl: 3   amoxicillin-clavulanate (AUGMENTIN) 500-125 MG tablet, Take 1 tablet by mouth daily as needed., Disp: , Rfl:    B Complex-C-Zn-Folic Acid (DIALYVITE 130 WITH ZINC) 0.8 MG TABS, Take 1 tablet by mouth daily., Disp: , Rfl:    carvedilol (COREG) 25 MG tablet, Take 1.5 tablets (37.5 mg total) by mouth 2 (two) times daily with a meal., Disp: 240 tablet, Rfl: 3   DHA-EPA-VITAMIN E PO, Take by mouth., Disp: , Rfl:    diltiazem (CARDIZEM) 60 MG tablet, Take 1 tablet (60 mg total) by mouth 3 (three) times daily as needed (Atrial fibrillation)., Disp: 30 tablet, Rfl: 2   DULoxetine (CYMBALTA) 20 MG capsule, Take 2 capsules (40 mg total) by mouth daily., Disp: 180 capsule, Rfl:  2   ethyl chloride spray, Apply 1 application topically every Monday, Wednesday, and Friday with hemodialysis., Disp: , Rfl: 12   fluconazole (DIFLUCAN) 150 MG tablet, Take 150 mg by mouth once., Disp: , Rfl:    Heparin Sod, Porcine, in D5W (HEPARIN SODIUM/D5W IV), Heparin Sodium (Porcine) 1,000 Units/mL Systemic, Disp: , Rfl:    hydrALAZINE (APRESOLINE) 25 MG tablet, Take 1 tablet (25 mg total) by mouth 3 (three) times daily., Disp: 270 tablet, Rfl: 3   isosorbide dinitrate (ISORDIL) 30 MG tablet, Take 1 tablet (30 mg total) by mouth 3 (three) times daily., Disp: 270 tablet, Rfl: 3   LINZESS 145 MCG CAPS  capsule, TAKE 1 CAPSULE BY MOUTH ONCE DAILY BEFORE BREAKFAST, Disp: 30 capsule, Rfl: 0   Methoxy PEG-Epoetin Beta (MIRCERA IJ), Mircera (Patient not taking: Reported on 02/28/2021), Disp: , Rfl:    omeprazole (PRILOSEC) 40 MG capsule, TAKE 1 CAPSULE BY MOUTH TWICE DAILY BEFORE A MEAL, Disp: 60 capsule, Rfl: 0   traMADol (ULTRAM) 50 MG tablet, Take 1 tablet (50 mg total) by mouth 3 (three) times daily as needed., Disp: 90 tablet, Rfl: 1   warfarin (COUMADIN) 5 MG tablet, USE AS DIRECTED BY COUMADIN CLINIC (Patient taking differently: USE AS DIRECTED BY COUMADIN CLINIC  Currently on: 5 mg every Mon, Wed; 2.5 mg all other days), Disp: 113 tablet, Rfl: 5 Past Medical History:  Diagnosis Date   Acid reflux    Anemia of chronic disease    Arthritis    Asthma    Atrial fibrillation (HCC)    Bilateral carotid bruits    Complication of anesthesia    "hard to wake up, I have sleep apnea" no CPAP   Depression    Diverticulitis    Duodenal ulcer    Dysrhythmia    Afib   ESRD (end stage renal disease) ([place])    MWF [place]   Headache    History of blood transfusion    Hypertension    Malaise and fatigue    Orthostatic hypotension    Shortness of breath    " when I walk to fast"   Sleep apnea    Syncope    Tubulovillous adenoma of colon    ASSESSMENT  Recent Results: The most recent result is correlated with 47.5 (??) mg per week:  Lab Results  Component Value Date   INR 3.6 (A) 03/05/2021   INR 3.6 (A) 02/21/2021   INR 3.3 (A) 02/14/2021    Anticoagulation Dosing: Description   INR above goal. HOLD today, then retry weekly dose to 5 mg every Mon, Wed and 2.5 mg all other days. Recheck INR in 1 weeks.       INR today: Supratherapeutic. INR continues to stay elevated. Pt unsure about the dose that she had been taking at home. Pt reports that she was using a pill box and wasn't sure how she set up her pill box. Pt was given a coumadin calender, but pt reports that she got  confused and isnt sure how she set up her pillbox. Reviewed in detail pt's prescribed warfarin dose and reviewed dosing calender instructions together. Pt verbalized understanding to prescribed dose. Pt noted to also be starting augmentin today. Increased anticoagulation effect in setting of DDI b/w warfarin and augmentin and fluconazole. Will retry previously discussed weekly dose and continue monitoring. Pt denies any other complains of unusual bleeding or bruising symptoms. Denies any other relevant changes in diet, medications, or lifestyle. Will continue close monitoring.  PLAN Weekly dose was unchanged by 0 % to 22.5 mg/week. HOLD today and then continue weekly dose of 5 mg every Mon, Wed and 2.5 mg all other days. Recheck INR in 1 week.     Patient Instructions  INR above goal. HOLD today, continue weekly dose to 5 mg every Mon, Wed and 2.5 mg all other days. Recheck INR in 1 weeks.  Patient advised to contact clinic or seek medical attention if signs/symptoms of bleeding or thromboembolism occur.  Patient verbalized understanding by repeating back information and was advised to contact me if further medication-related questions arise.   Follow-up Return in about 9 days (around 03/14/2021).  [name], PharmD  15 minutes spent face-to-face with the patient during the encounter. 50% of time spent on education, including signs/sx bleeding and clotting, as well as food and drug interactions with warfarin. 50% of time was spent on fingerprick POC INR sample collection,processing, results determination, and documentation

## 2021-03-05 NOTE — Patient Instructions (Signed)
INR above goal. HOLD today, continue weekly dose to 5 mg every Mon, Wed and 2.5 mg all other days. Recheck INR in 1 weeks.

## 2021-03-06 DIAGNOSIS — Z992 Dependence on renal dialysis: Secondary | ICD-10-CM | POA: Diagnosis not present

## 2021-03-06 DIAGNOSIS — D631 Anemia in chronic kidney disease: Secondary | ICD-10-CM | POA: Diagnosis not present

## 2021-03-06 DIAGNOSIS — N2581 Secondary hyperparathyroidism of renal origin: Secondary | ICD-10-CM | POA: Diagnosis not present

## 2021-03-06 DIAGNOSIS — N186 End stage renal disease: Secondary | ICD-10-CM | POA: Diagnosis not present

## 2021-03-07 ENCOUNTER — Ambulatory Visit: Payer: Medicare Other | Admitting: Physical Medicine & Rehabilitation

## 2021-03-08 DIAGNOSIS — N186 End stage renal disease: Secondary | ICD-10-CM | POA: Diagnosis not present

## 2021-03-08 DIAGNOSIS — N2581 Secondary hyperparathyroidism of renal origin: Secondary | ICD-10-CM | POA: Diagnosis not present

## 2021-03-08 DIAGNOSIS — D631 Anemia in chronic kidney disease: Secondary | ICD-10-CM | POA: Diagnosis not present

## 2021-03-08 DIAGNOSIS — Z992 Dependence on renal dialysis: Secondary | ICD-10-CM | POA: Diagnosis not present

## 2021-03-11 DIAGNOSIS — N2581 Secondary hyperparathyroidism of renal origin: Secondary | ICD-10-CM | POA: Diagnosis not present

## 2021-03-11 DIAGNOSIS — Z992 Dependence on renal dialysis: Secondary | ICD-10-CM | POA: Diagnosis not present

## 2021-03-11 DIAGNOSIS — D631 Anemia in chronic kidney disease: Secondary | ICD-10-CM | POA: Diagnosis not present

## 2021-03-11 DIAGNOSIS — N186 End stage renal disease: Secondary | ICD-10-CM | POA: Diagnosis not present

## 2021-03-13 DIAGNOSIS — Z992 Dependence on renal dialysis: Secondary | ICD-10-CM | POA: Diagnosis not present

## 2021-03-13 DIAGNOSIS — N2581 Secondary hyperparathyroidism of renal origin: Secondary | ICD-10-CM | POA: Diagnosis not present

## 2021-03-13 DIAGNOSIS — D631 Anemia in chronic kidney disease: Secondary | ICD-10-CM | POA: Diagnosis not present

## 2021-03-13 DIAGNOSIS — N186 End stage renal disease: Secondary | ICD-10-CM | POA: Diagnosis not present

## 2021-03-14 ENCOUNTER — Other Ambulatory Visit: Payer: Self-pay

## 2021-03-14 ENCOUNTER — Ambulatory Visit: Payer: Medicare Other | Admitting: Pharmacist

## 2021-03-14 DIAGNOSIS — Z5181 Encounter for therapeutic drug level monitoring: Secondary | ICD-10-CM

## 2021-03-14 DIAGNOSIS — I480 Paroxysmal atrial fibrillation: Secondary | ICD-10-CM

## 2021-03-14 DIAGNOSIS — Z7901 Long term (current) use of anticoagulants: Secondary | ICD-10-CM | POA: Diagnosis not present

## 2021-03-14 LAB — POCT INR: INR: 2.5 (ref 2.0–3.0)

## 2021-03-14 NOTE — Progress Notes (Signed)
Anticoagulation Management Jane [name] is a 75 y.o. female who reports to the clinic for monitoring of warfarin treatment.    Indication: atrial fibrillation CHA2DS2 Vasc Score 4 (Age >23, female, HTN hx), HAS-BLED 2 (Age>65, renal disease)  Duration: indefinite Supervising physician: Adrian Prows  Anticoagulation Clinic Visit History:  Patient does report signs/symptoms of bleeding or thromboembolism.  Reports no changes in diet, medications, or lifestyle.   Pt reports that concerns of sinus infection have improved since starting Augmentin. Completing course ~03/16/21. Pt hasnt used her prophylactic fluconazole yet. Pt verbalized understanding to warfarin prescribed weekly dose.   Pt traveling for 1 week.   Anticoagulation Episode Summary     Current INR goal:  2.0-3.0  TTR:  64.5 % (2.4 y)  Next INR check:  04/02/2021  INR from last check:  2.5 (03/14/2021)  Weekly max warfarin dose:    Target end date:  Indefinite  INR check location:    Preferred lab:    Send INR reminders to:     Indications   Paroxysmal atrial fibrillation (HCC) [I48.0] Monitoring for long-term anticoagulant use [Z51.81 Z79.01]        Comments:           Allergies  Allergen Reactions   Latex Rash   Penicillins Other (See Comments)    Yeast infection / Childhood   Sulfa Antibiotics Rash   Tape Other (See Comments)    Plastic, silicone, and paper tape causes bruising and pulls off skin. Cloth tape works fine    Current Outpatient Medications:    albuterol (PROVENTIL HFA;VENTOLIN HFA) 108 (90 Base) MCG/ACT inhaler, Inhale 1 puff into the lungs every 6 (six) hours as needed for wheezing or shortness of breath., Disp: 6.7 g, Rfl: 2   amiodarone (PACERONE) 200 MG tablet, Take 1 tablet (200 mg total) by mouth daily., Disp: 90 tablet, Rfl: 1   amLODipine (NORVASC) 10 MG tablet, Take 1 tablet (10 mg total) by mouth daily., Disp: 90 tablet, Rfl: 3   amoxicillin-clavulanate (AUGMENTIN) 500-125 MG tablet,  Take 1 tablet by mouth daily as needed., Disp: , Rfl:    B Complex-C-Zn-Folic Acid (DIALYVITE 277 WITH ZINC) 0.8 MG TABS, Take 1 tablet by mouth daily., Disp: , Rfl:    carvedilol (COREG) 25 MG tablet, Take 1.5 tablets (37.5 mg total) by mouth 2 (two) times daily with a meal., Disp: 240 tablet, Rfl: 3   DHA-EPA-VITAMIN E PO, Take by mouth., Disp: , Rfl:    diltiazem (CARDIZEM) 60 MG tablet, Take 1 tablet (60 mg total) by mouth 3 (three) times daily as needed (Atrial fibrillation)., Disp: 30 tablet, Rfl: 2   DULoxetine (CYMBALTA) 20 MG capsule, Take 2 capsules (40 mg total) by mouth daily., Disp: 180 capsule, Rfl: 2   ethyl chloride spray, Apply 1 application topically every Monday, Wednesday, and Friday with hemodialysis., Disp: , Rfl: 12   fluconazole (DIFLUCAN) 150 MG tablet, Take 150 mg by mouth once., Disp: , Rfl:    Heparin Sod, Porcine, in D5W (HEPARIN SODIUM/D5W IV), Heparin Sodium (Porcine) 1,000 Units/mL Systemic, Disp: , Rfl:    hydrALAZINE (APRESOLINE) 25 MG tablet, Take 1 tablet (25 mg total) by mouth 3 (three) times daily., Disp: 270 tablet, Rfl: 3   isosorbide dinitrate (ISORDIL) 30 MG tablet, Take 1 tablet (30 mg total) by mouth 3 (three) times daily., Disp: 270 tablet, Rfl: 3   LINZESS 145 MCG CAPS capsule, TAKE 1 CAPSULE BY MOUTH ONCE DAILY BEFORE BREAKFAST, Disp: 30 capsule, Rfl: 0  Methoxy PEG-Epoetin Beta (MIRCERA IJ), Mircera (Patient not taking: Reported on 02/28/2021), Disp: , Rfl:    omeprazole (PRILOSEC) 40 MG capsule, TAKE 1 CAPSULE BY MOUTH TWICE DAILY BEFORE A MEAL, Disp: 60 capsule, Rfl: 0   traMADol (ULTRAM) 50 MG tablet, Take 1 tablet (50 mg total) by mouth 3 (three) times daily as needed., Disp: 90 tablet, Rfl: 1   warfarin (COUMADIN) 5 MG tablet, USE AS DIRECTED BY COUMADIN CLINIC (Patient taking differently: USE AS DIRECTED BY COUMADIN CLINIC  Currently on: 5 mg every Mon, Wed; 2.5 mg all other days), Disp: 113 tablet, Rfl: 5 Past Medical History:  Diagnosis Date    Acid reflux    Anemia of chronic disease    Arthritis    Asthma    Atrial fibrillation (HCC)    Bilateral carotid bruits    Complication of anesthesia    "hard to wake up, I have sleep apnea" no CPAP   Depression    Diverticulitis    Duodenal ulcer    Dysrhythmia    Afib   ESRD (end stage renal disease) ([place])    MWF [place]   Headache    History of blood transfusion    Hypertension    Malaise and fatigue    Orthostatic hypotension    Shortness of breath    " when I walk to fast"   Sleep apnea    Syncope    Tubulovillous adenoma of colon    ASSESSMENT  Recent Results: The most recent result is correlated with 22.5 mg per week:  Lab Results  Component Value Date   INR 2.5 03/14/2021   INR 3.6 (A) 03/05/2021   INR 3.6 (A) 02/21/2021    Anticoagulation Dosing: Description   INR at goal. Continue taking 5 mg every Mon, Wed and 2.5 mg all other days. Recheck INR in 3 weeks.       INR today: Therapeutic. Following returning to prescribed weekly warfarin dose. Pt denies any complains of relevant bleeding or bruising symptoms. Denies any other relevant changes in diet, medications, or lifestyle. Pt verbalized prescribed warfarin dose.Will continue current weekly dose and close monitoring in setting or recent labile INR readings.    PLAN Weekly dose was unchanged by 0 % to 22.5 mg/week. Continue weekly dose of 5 mg every Mon, Wed and 2.5 mg all other days. Recheck INR in 3 week.     Patient Instructions  INR at goal. Continue taking 5 mg every Mon, Wed and 2.5 mg all other days. Recheck INR in 3 weeks.  Patient advised to contact clinic or seek medical attention if signs/symptoms of bleeding or thromboembolism occur.  Patient verbalized understanding by repeating back information and was advised to contact me if further medication-related questions arise.   Follow-up Return in about 19 days (around 04/02/2021).  [name], PharmD  15 minutes spent  face-to-face with the patient during the encounter. 50% of time spent on education, including signs/sx bleeding and clotting, as well as food and drug interactions with warfarin. 50% of time was spent on fingerprick POC INR sample collection,processing, results determination, and documentation

## 2021-03-14 NOTE — Patient Instructions (Signed)
INR at goal. Continue taking 5 mg every Mon, Wed and 2.5 mg all other days. Recheck INR in 3 weeks.

## 2021-03-15 DIAGNOSIS — N2581 Secondary hyperparathyroidism of renal origin: Secondary | ICD-10-CM | POA: Diagnosis not present

## 2021-03-15 DIAGNOSIS — Z992 Dependence on renal dialysis: Secondary | ICD-10-CM | POA: Diagnosis not present

## 2021-03-15 DIAGNOSIS — D631 Anemia in chronic kidney disease: Secondary | ICD-10-CM | POA: Diagnosis not present

## 2021-03-15 DIAGNOSIS — N186 End stage renal disease: Secondary | ICD-10-CM | POA: Diagnosis not present

## 2021-03-17 DIAGNOSIS — Z20822 Contact with and (suspected) exposure to covid-19: Secondary | ICD-10-CM | POA: Diagnosis not present

## 2021-03-18 DIAGNOSIS — D631 Anemia in chronic kidney disease: Secondary | ICD-10-CM | POA: Diagnosis not present

## 2021-03-18 DIAGNOSIS — N2581 Secondary hyperparathyroidism of renal origin: Secondary | ICD-10-CM | POA: Diagnosis not present

## 2021-03-18 DIAGNOSIS — Z992 Dependence on renal dialysis: Secondary | ICD-10-CM | POA: Diagnosis not present

## 2021-03-18 DIAGNOSIS — N186 End stage renal disease: Secondary | ICD-10-CM | POA: Diagnosis not present

## 2021-03-20 DIAGNOSIS — N186 End stage renal disease: Secondary | ICD-10-CM | POA: Diagnosis not present

## 2021-03-20 DIAGNOSIS — Z992 Dependence on renal dialysis: Secondary | ICD-10-CM | POA: Diagnosis not present

## 2021-03-21 DIAGNOSIS — Z992 Dependence on renal dialysis: Secondary | ICD-10-CM | POA: Diagnosis not present

## 2021-03-21 DIAGNOSIS — I158 Other secondary hypertension: Secondary | ICD-10-CM | POA: Diagnosis not present

## 2021-03-21 DIAGNOSIS — N186 End stage renal disease: Secondary | ICD-10-CM | POA: Diagnosis not present

## 2021-03-22 DIAGNOSIS — Z992 Dependence on renal dialysis: Secondary | ICD-10-CM | POA: Diagnosis not present

## 2021-03-22 DIAGNOSIS — N186 End stage renal disease: Secondary | ICD-10-CM | POA: Diagnosis not present

## 2021-03-25 DIAGNOSIS — N186 End stage renal disease: Secondary | ICD-10-CM | POA: Diagnosis not present

## 2021-03-25 DIAGNOSIS — I10 Essential (primary) hypertension: Secondary | ICD-10-CM | POA: Diagnosis not present

## 2021-03-25 DIAGNOSIS — I480 Paroxysmal atrial fibrillation: Secondary | ICD-10-CM | POA: Diagnosis not present

## 2021-03-25 DIAGNOSIS — Z992 Dependence on renal dialysis: Secondary | ICD-10-CM | POA: Diagnosis not present

## 2021-03-27 DIAGNOSIS — N186 End stage renal disease: Secondary | ICD-10-CM | POA: Diagnosis not present

## 2021-03-27 DIAGNOSIS — Z992 Dependence on renal dialysis: Secondary | ICD-10-CM | POA: Diagnosis not present

## 2021-03-29 DIAGNOSIS — N186 End stage renal disease: Secondary | ICD-10-CM | POA: Diagnosis not present

## 2021-03-29 DIAGNOSIS — Z992 Dependence on renal dialysis: Secondary | ICD-10-CM | POA: Diagnosis not present

## 2021-04-01 DIAGNOSIS — N186 End stage renal disease: Secondary | ICD-10-CM | POA: Diagnosis not present

## 2021-04-01 DIAGNOSIS — Z992 Dependence on renal dialysis: Secondary | ICD-10-CM | POA: Diagnosis not present

## 2021-04-01 DIAGNOSIS — D631 Anemia in chronic kidney disease: Secondary | ICD-10-CM | POA: Diagnosis not present

## 2021-04-01 DIAGNOSIS — N2581 Secondary hyperparathyroidism of renal origin: Secondary | ICD-10-CM | POA: Diagnosis not present

## 2021-04-02 ENCOUNTER — Ambulatory Visit: Payer: Medicare Other | Admitting: Pharmacist

## 2021-04-02 ENCOUNTER — Other Ambulatory Visit: Payer: Self-pay

## 2021-04-02 DIAGNOSIS — I480 Paroxysmal atrial fibrillation: Secondary | ICD-10-CM

## 2021-04-02 DIAGNOSIS — Z5181 Encounter for therapeutic drug level monitoring: Secondary | ICD-10-CM | POA: Diagnosis not present

## 2021-04-02 DIAGNOSIS — Z7901 Long term (current) use of anticoagulants: Secondary | ICD-10-CM | POA: Diagnosis not present

## 2021-04-02 LAB — POCT INR: INR: 2.6 (ref 2.0–3.0)

## 2021-04-02 NOTE — Progress Notes (Signed)
Anticoagulation Management Jane [name] is a 75 y.o. female who reports to the clinic for monitoring of warfarin treatment.    Indication: atrial fibrillation CHA2DS2 Vasc Score 4 (Age >72, female, HTN hx), HAS-BLED 2 (Age>65, renal disease)  Duration: indefinite Supervising physician: Adrian Prows  Anticoagulation Clinic Visit History:  Patient does report signs/symptoms of bleeding or thromboembolism.  Reports no changes in diet, medications, or lifestyle.   Amlodipine stopped per nephrology due to increased leg swelling.   Anticoagulation Episode Summary     Current INR goal:  2.0-3.0  TTR:  65.2 % (2.5 y)  Next INR check:  04/30/2021  INR from last check:  2.6 (04/02/2021)  Weekly max warfarin dose:    Target end date:  Indefinite  INR check location:    Preferred lab:    Send INR reminders to:     Indications   Paroxysmal atrial fibrillation (HCC) [I48.0] Monitoring for long-term anticoagulant use [Z51.81 Z79.01]        Comments:           Allergies  Allergen Reactions   Latex Rash   Penicillins Other (See Comments)    Yeast infection / Childhood   Sulfa Antibiotics Rash   Tape Other (See Comments)    Plastic, silicone, and paper tape causes bruising and pulls off skin. Cloth tape works fine    Current Outpatient Medications:    albuterol (PROVENTIL HFA;VENTOLIN HFA) 108 (90 Base) MCG/ACT inhaler, Inhale 1 puff into the lungs every 6 (six) hours as needed for wheezing or shortness of breath., Disp: 6.7 g, Rfl: 2   amiodarone (PACERONE) 200 MG tablet, Take 1 tablet (200 mg total) by mouth daily., Disp: 90 tablet, Rfl: 1   amLODipine (NORVASC) 10 MG tablet, Take 1 tablet (10 mg total) by mouth daily. (Patient not taking: Reported on 04/02/2021), Disp: 90 tablet, Rfl: 3   amoxicillin-clavulanate (AUGMENTIN) 500-125 MG tablet, Take 1 tablet by mouth daily as needed., Disp: , Rfl:    B Complex-C-Zn-Folic Acid (DIALYVITE 458 WITH ZINC) 0.8 MG TABS, Take 1 tablet by  mouth daily., Disp: , Rfl:    carvedilol (COREG) 25 MG tablet, Take 1.5 tablets (37.5 mg total) by mouth 2 (two) times daily with a meal., Disp: 240 tablet, Rfl: 3   DHA-EPA-VITAMIN E PO, Take by mouth., Disp: , Rfl:    diltiazem (CARDIZEM) 60 MG tablet, Take 1 tablet (60 mg total) by mouth 3 (three) times daily as needed (Atrial fibrillation)., Disp: 30 tablet, Rfl: 2   DULoxetine (CYMBALTA) 20 MG capsule, Take 2 capsules (40 mg total) by mouth daily., Disp: 180 capsule, Rfl: 2   ethyl chloride spray, Apply 1 application topically every Monday, Wednesday, and Friday with hemodialysis., Disp: , Rfl: 12   fluconazole (DIFLUCAN) 150 MG tablet, Take 150 mg by mouth once., Disp: , Rfl:    Heparin Sod, Porcine, in D5W (HEPARIN SODIUM/D5W IV), Heparin Sodium (Porcine) 1,000 Units/mL Systemic, Disp: , Rfl:    hydrALAZINE (APRESOLINE) 25 MG tablet, Take 1 tablet (25 mg total) by mouth 3 (three) times daily., Disp: 270 tablet, Rfl: 3   isosorbide dinitrate (ISORDIL) 30 MG tablet, Take 1 tablet (30 mg total) by mouth 3 (three) times daily., Disp: 270 tablet, Rfl: 3   LINZESS 145 MCG CAPS capsule, TAKE 1 CAPSULE BY MOUTH ONCE DAILY BEFORE BREAKFAST, Disp: 30 capsule, Rfl: 0   Methoxy PEG-Epoetin Beta (MIRCERA IJ), Mircera (Patient not taking: Reported on 02/28/2021), Disp: , Rfl:    omeprazole (PRILOSEC)  40 MG capsule, TAKE 1 CAPSULE BY MOUTH TWICE DAILY BEFORE A MEAL, Disp: 60 capsule, Rfl: 0   traMADol (ULTRAM) 50 MG tablet, Take 1 tablet (50 mg total) by mouth 3 (three) times daily as needed., Disp: 90 tablet, Rfl: 1   warfarin (COUMADIN) 5 MG tablet, USE AS DIRECTED BY COUMADIN CLINIC (Patient taking differently: USE AS DIRECTED BY COUMADIN CLINIC  Currently on: 5 mg every Mon, Wed; 2.5 mg all other days), Disp: 113 tablet, Rfl: 5 Past Medical History:  Diagnosis Date   Acid reflux    Anemia of chronic disease    Arthritis    Asthma    Atrial fibrillation (HCC)    Bilateral carotid bruits     Complication of anesthesia    "hard to wake up, I have sleep apnea" no CPAP   Depression    Diverticulitis    Duodenal ulcer    Dysrhythmia    Afib   ESRD (end stage renal disease) ([place])    MWF [place]   Headache    History of blood transfusion    Hypertension    Malaise and fatigue    Orthostatic hypotension    Shortness of breath    " when I walk to fast"   Sleep apnea    Syncope    Tubulovillous adenoma of colon    ASSESSMENT  Recent Results: The most recent result is correlated with 22.5 mg per week:  Lab Results  Component Value Date   INR 2.6 04/02/2021   INR 2.5 03/14/2021   INR 3.6 (A) 03/05/2021    Anticoagulation Dosing: Description   INR at goal. Continue taking 5 mg every Mon, Wed and 2.5 mg all other days. Recheck INR in 4 weeks.       INR today: Therapeutic. Continues to remain therapeutic on current weekly dose. Pt denies any complains of relevant bleeding or bruising symptoms. Denies any other relevant changes in diet, medications, or lifestyle. Pt verbalized prescribed warfarin dose.Will continue current weekly dose and chronic monitoring.    PLAN Weekly dose was unchanged by 0 % to 22.5 mg/week. Continue weekly dose of 5 mg every Mon, Wed and 2.5 mg all other days. Recheck INR in 4 week.     Patient Instructions  INR at goal. Continue taking 5 mg every Mon, Wed and 2.5 mg all other days. Recheck INR in 4 weeks.  Patient advised to contact clinic or seek medical attention if signs/symptoms of bleeding or thromboembolism occur.  Patient verbalized understanding by repeating back information and was advised to contact me if further medication-related questions arise.   Follow-up Return in about 4 weeks (around 04/30/2021).  [name], PharmD  15 minutes spent face-to-face with the patient during the encounter. 50% of time spent on education, including signs/sx bleeding and clotting, as well as food and drug interactions with warfarin.  50% of time was spent on fingerprick POC INR sample collection,processing, results determination, and documentation

## 2021-04-02 NOTE — Patient Instructions (Signed)
INR at goal. Continue taking 5 mg every Mon, Wed and 2.5 mg all other days. Recheck INR in 4 weeks.

## 2021-04-03 DIAGNOSIS — I4891 Unspecified atrial fibrillation: Secondary | ICD-10-CM | POA: Diagnosis not present

## 2021-04-03 DIAGNOSIS — Z992 Dependence on renal dialysis: Secondary | ICD-10-CM | POA: Diagnosis not present

## 2021-04-03 DIAGNOSIS — N186 End stage renal disease: Secondary | ICD-10-CM | POA: Diagnosis not present

## 2021-04-03 DIAGNOSIS — N2581 Secondary hyperparathyroidism of renal origin: Secondary | ICD-10-CM | POA: Diagnosis not present

## 2021-04-03 DIAGNOSIS — D631 Anemia in chronic kidney disease: Secondary | ICD-10-CM | POA: Diagnosis not present

## 2021-04-05 DIAGNOSIS — Z992 Dependence on renal dialysis: Secondary | ICD-10-CM | POA: Diagnosis not present

## 2021-04-05 DIAGNOSIS — N2581 Secondary hyperparathyroidism of renal origin: Secondary | ICD-10-CM | POA: Diagnosis not present

## 2021-04-05 DIAGNOSIS — N186 End stage renal disease: Secondary | ICD-10-CM | POA: Diagnosis not present

## 2021-04-05 DIAGNOSIS — D631 Anemia in chronic kidney disease: Secondary | ICD-10-CM | POA: Diagnosis not present

## 2021-04-08 DIAGNOSIS — Z992 Dependence on renal dialysis: Secondary | ICD-10-CM | POA: Diagnosis not present

## 2021-04-08 DIAGNOSIS — N186 End stage renal disease: Secondary | ICD-10-CM | POA: Diagnosis not present

## 2021-04-08 DIAGNOSIS — D631 Anemia in chronic kidney disease: Secondary | ICD-10-CM | POA: Diagnosis not present

## 2021-04-08 DIAGNOSIS — N2581 Secondary hyperparathyroidism of renal origin: Secondary | ICD-10-CM | POA: Diagnosis not present

## 2021-04-10 ENCOUNTER — Other Ambulatory Visit: Payer: Self-pay | Admitting: Gastroenterology

## 2021-04-10 DIAGNOSIS — D631 Anemia in chronic kidney disease: Secondary | ICD-10-CM | POA: Diagnosis not present

## 2021-04-10 DIAGNOSIS — N186 End stage renal disease: Secondary | ICD-10-CM | POA: Diagnosis not present

## 2021-04-10 DIAGNOSIS — Z992 Dependence on renal dialysis: Secondary | ICD-10-CM | POA: Diagnosis not present

## 2021-04-10 DIAGNOSIS — N2581 Secondary hyperparathyroidism of renal origin: Secondary | ICD-10-CM | POA: Diagnosis not present

## 2021-04-12 DIAGNOSIS — N186 End stage renal disease: Secondary | ICD-10-CM | POA: Diagnosis not present

## 2021-04-12 DIAGNOSIS — N2581 Secondary hyperparathyroidism of renal origin: Secondary | ICD-10-CM | POA: Diagnosis not present

## 2021-04-12 DIAGNOSIS — Z992 Dependence on renal dialysis: Secondary | ICD-10-CM | POA: Diagnosis not present

## 2021-04-12 DIAGNOSIS — D631 Anemia in chronic kidney disease: Secondary | ICD-10-CM | POA: Diagnosis not present

## 2021-04-15 DIAGNOSIS — D631 Anemia in chronic kidney disease: Secondary | ICD-10-CM | POA: Diagnosis not present

## 2021-04-15 DIAGNOSIS — N2581 Secondary hyperparathyroidism of renal origin: Secondary | ICD-10-CM | POA: Diagnosis not present

## 2021-04-15 DIAGNOSIS — Z992 Dependence on renal dialysis: Secondary | ICD-10-CM | POA: Diagnosis not present

## 2021-04-15 DIAGNOSIS — N186 End stage renal disease: Secondary | ICD-10-CM | POA: Diagnosis not present

## 2021-04-17 DIAGNOSIS — D631 Anemia in chronic kidney disease: Secondary | ICD-10-CM | POA: Diagnosis not present

## 2021-04-17 DIAGNOSIS — N186 End stage renal disease: Secondary | ICD-10-CM | POA: Diagnosis not present

## 2021-04-17 DIAGNOSIS — N2581 Secondary hyperparathyroidism of renal origin: Secondary | ICD-10-CM | POA: Diagnosis not present

## 2021-04-17 DIAGNOSIS — Z992 Dependence on renal dialysis: Secondary | ICD-10-CM | POA: Diagnosis not present

## 2021-04-18 ENCOUNTER — Ambulatory Visit: Payer: Medicare Other | Admitting: Cardiology

## 2021-04-19 DIAGNOSIS — N186 End stage renal disease: Secondary | ICD-10-CM | POA: Diagnosis not present

## 2021-04-19 DIAGNOSIS — N2581 Secondary hyperparathyroidism of renal origin: Secondary | ICD-10-CM | POA: Diagnosis not present

## 2021-04-19 DIAGNOSIS — Z992 Dependence on renal dialysis: Secondary | ICD-10-CM | POA: Diagnosis not present

## 2021-04-19 DIAGNOSIS — D631 Anemia in chronic kidney disease: Secondary | ICD-10-CM | POA: Diagnosis not present

## 2021-04-20 DIAGNOSIS — I158 Other secondary hypertension: Secondary | ICD-10-CM | POA: Diagnosis not present

## 2021-04-20 DIAGNOSIS — Z992 Dependence on renal dialysis: Secondary | ICD-10-CM | POA: Diagnosis not present

## 2021-04-20 DIAGNOSIS — N186 End stage renal disease: Secondary | ICD-10-CM | POA: Diagnosis not present

## 2021-04-22 DIAGNOSIS — N2581 Secondary hyperparathyroidism of renal origin: Secondary | ICD-10-CM | POA: Diagnosis not present

## 2021-04-22 DIAGNOSIS — Z23 Encounter for immunization: Secondary | ICD-10-CM | POA: Diagnosis not present

## 2021-04-22 DIAGNOSIS — N186 End stage renal disease: Secondary | ICD-10-CM | POA: Diagnosis not present

## 2021-04-22 DIAGNOSIS — D631 Anemia in chronic kidney disease: Secondary | ICD-10-CM | POA: Diagnosis not present

## 2021-04-22 DIAGNOSIS — Z992 Dependence on renal dialysis: Secondary | ICD-10-CM | POA: Diagnosis not present

## 2021-04-24 DIAGNOSIS — N186 End stage renal disease: Secondary | ICD-10-CM | POA: Diagnosis not present

## 2021-04-24 DIAGNOSIS — Z992 Dependence on renal dialysis: Secondary | ICD-10-CM | POA: Diagnosis not present

## 2021-04-24 DIAGNOSIS — N2581 Secondary hyperparathyroidism of renal origin: Secondary | ICD-10-CM | POA: Diagnosis not present

## 2021-04-24 DIAGNOSIS — Z23 Encounter for immunization: Secondary | ICD-10-CM | POA: Diagnosis not present

## 2021-04-24 DIAGNOSIS — I480 Paroxysmal atrial fibrillation: Secondary | ICD-10-CM | POA: Diagnosis not present

## 2021-04-24 DIAGNOSIS — D631 Anemia in chronic kidney disease: Secondary | ICD-10-CM | POA: Diagnosis not present

## 2021-04-24 DIAGNOSIS — I10 Essential (primary) hypertension: Secondary | ICD-10-CM | POA: Diagnosis not present

## 2021-04-26 DIAGNOSIS — N186 End stage renal disease: Secondary | ICD-10-CM | POA: Diagnosis not present

## 2021-04-26 DIAGNOSIS — Z23 Encounter for immunization: Secondary | ICD-10-CM | POA: Diagnosis not present

## 2021-04-26 DIAGNOSIS — D631 Anemia in chronic kidney disease: Secondary | ICD-10-CM | POA: Diagnosis not present

## 2021-04-26 DIAGNOSIS — Z992 Dependence on renal dialysis: Secondary | ICD-10-CM | POA: Diagnosis not present

## 2021-04-26 DIAGNOSIS — N2581 Secondary hyperparathyroidism of renal origin: Secondary | ICD-10-CM | POA: Diagnosis not present

## 2021-04-29 ENCOUNTER — Other Ambulatory Visit: Payer: Self-pay

## 2021-04-29 ENCOUNTER — Emergency Department (HOSPITAL_COMMUNITY)
Admission: EM | Admit: 2021-04-29 | Discharge: 2021-04-29 | Disposition: A | Discharge status: Home / Self Care | Payer: Medicare Other | Attending: Emergency Medicine | Admitting: Emergency Medicine

## 2021-04-29 ENCOUNTER — Emergency Department (HOSPITAL_COMMUNITY): Payer: Medicare Other

## 2021-04-29 DIAGNOSIS — Z7901 Long term (current) use of anticoagulants: Secondary | ICD-10-CM | POA: Insufficient documentation

## 2021-04-29 DIAGNOSIS — Z79899 Other long term (current) drug therapy: Secondary | ICD-10-CM | POA: Diagnosis not present

## 2021-04-29 DIAGNOSIS — M47812 Spondylosis without myelopathy or radiculopathy, cervical region: Secondary | ICD-10-CM

## 2021-04-29 DIAGNOSIS — J45909 Unspecified asthma, uncomplicated: Secondary | ICD-10-CM | POA: Diagnosis not present

## 2021-04-29 DIAGNOSIS — N186 End stage renal disease: Secondary | ICD-10-CM | POA: Diagnosis not present

## 2021-04-29 DIAGNOSIS — M549 Dorsalgia, unspecified: Secondary | ICD-10-CM | POA: Diagnosis not present

## 2021-04-29 DIAGNOSIS — I120 Hypertensive chronic kidney disease with stage 5 chronic kidney disease or end stage renal disease: Secondary | ICD-10-CM | POA: Insufficient documentation

## 2021-04-29 DIAGNOSIS — Z91040 Latex allergy status: Secondary | ICD-10-CM | POA: Insufficient documentation

## 2021-04-29 DIAGNOSIS — M542 Cervicalgia: Secondary | ICD-10-CM | POA: Diagnosis not present

## 2021-04-29 DIAGNOSIS — M5416 Radiculopathy, lumbar region: Secondary | ICD-10-CM

## 2021-04-29 DIAGNOSIS — Z992 Dependence on renal dialysis: Secondary | ICD-10-CM | POA: Diagnosis not present

## 2021-04-29 DIAGNOSIS — M545 Low back pain, unspecified: Secondary | ICD-10-CM | POA: Insufficient documentation

## 2021-04-29 DIAGNOSIS — I4891 Unspecified atrial fibrillation: Secondary | ICD-10-CM | POA: Insufficient documentation

## 2021-04-29 DIAGNOSIS — R5381 Other malaise: Secondary | ICD-10-CM | POA: Diagnosis not present

## 2021-04-29 DIAGNOSIS — M5412 Radiculopathy, cervical region: Secondary | ICD-10-CM | POA: Diagnosis not present

## 2021-04-29 LAB — CBC WITH DIFFERENTIAL/PLATELET
Abs Immature Granulocytes: 0.01 10*3/uL (ref 0.00–0.07)
Basophils Absolute: 0 10*3/uL (ref 0.0–0.1)
Basophils Relative: 0 %
Eosinophils Absolute: 0.3 10*3/uL (ref 0.0–0.5)
Eosinophils Relative: 5 %
HCT: 39.4 % (ref 36.0–46.0)
Hemoglobin: 11.8 g/dL — ABNORMAL LOW (ref 12.0–15.0)
Immature Granulocytes: 0 %
Lymphocytes Relative: 16 %
Lymphs Abs: 0.9 10*3/uL (ref 0.7–4.0)
MCH: 26.3 pg (ref 26.0–34.0)
MCHC: 29.9 g/dL — ABNORMAL LOW (ref 30.0–36.0)
MCV: 87.9 fL (ref 80.0–100.0)
Monocytes Absolute: 0.7 10*3/uL (ref 0.1–1.0)
Monocytes Relative: 13 %
Neutro Abs: 3.6 10*3/uL (ref 1.7–7.7)
Neutrophils Relative %: 66 %
Platelets: 213 10*3/uL (ref 150–400)
RBC: 4.48 MIL/uL (ref 3.87–5.11)
RDW: 20.1 % — ABNORMAL HIGH (ref 11.5–15.5)
WBC: 5.4 10*3/uL (ref 4.0–10.5)
nRBC: 0 % (ref 0.0–0.2)

## 2021-04-29 LAB — BASIC METABOLIC PANEL
Anion gap: 10 (ref 5–15)
BUN: 36 mg/dL — ABNORMAL HIGH (ref 8–23)
CO2: 26 mmol/L (ref 22–32)
Calcium: 9.8 mg/dL (ref 8.9–10.3)
Chloride: 101 mmol/L (ref 98–111)
Creatinine, Ser: 9.77 mg/dL — ABNORMAL HIGH (ref 0.44–1.00)
GFR, Estimated: 4 mL/min — ABNORMAL LOW (ref >60)
Glucose, Bld: 110 mg/dL — ABNORMAL HIGH (ref 70–99)
Potassium: 3.3 mmol/L — ABNORMAL LOW (ref 3.5–5.1)
Sodium: 137 mmol/L (ref 135–145)

## 2021-04-29 MED ORDER — MORPHINE SULFATE (PF) 4 MG/ML IV SOLN
4.0000 mg | Freq: Once | INTRAVENOUS | Status: AC
  Administered 2021-04-29: 4 mg via INTRAVENOUS
  Filled 2021-04-29: qty 1

## 2021-04-29 MED ORDER — HYDROCODONE-ACETAMINOPHEN 5-325 MG PO TABS: 1.0000 | ORAL_TABLET | Freq: Four times a day (QID) | ORAL | 0 refills | Status: DC | PRN

## 2021-04-29 MED ORDER — CYCLOBENZAPRINE HCL 5 MG PO TABS: 5.0000 mg | ORAL_TABLET | Freq: Three times a day (TID) | ORAL | 0 refills | Status: DC | PRN

## 2021-04-29 NOTE — ED Notes (Signed)
Attempted IV access in L hand. Unable to obtain venous access at this time. MD aware

## 2021-04-29 NOTE — ED Provider Notes (Signed)
[facility] EMERGENCY DEPARTMENT Provider Note   CSN: [id number] Arrival date & time: 04/29/21  1041     History No chief complaint on file.   Jane [name] is a 75 y.o. female hx of afib on coumadin, ESRD on dialysis (last HD 2 days ago), chronic back pain with radiculopathy here presenting with upper back pain and neck pain.  Patient states that she has chronic low back pain.  She was prescribed tramadol.  She states that she is in the process of getting a referral to a pain specialist.  She was told she has arthritis in her back.  She has chronic sciatica symptoms to both legs.  She denies any trouble urinating.  She states that for the last week or so, she has been having neck pain.  She states that it is the left side of her neck and radiates to the scapular area.  Denies any arm numbness or weakness.  She states that she was in so much pain that she cannot make it to dialysis today. Denies any chest pain or shortness of breath.   The history is provided by the patient.      Past Medical History:  Diagnosis Date   Acid reflux    Anemia of chronic disease    Arthritis    Asthma    Atrial fibrillation ([place])    Bilateral carotid bruits    Complication of anesthesia    "hard to wake up, I have sleep apnea" no CPAP   Depression    Diverticulitis    Duodenal ulcer    Dysrhythmia    Afib   ESRD (end stage renal disease) ([place])    MWF [place]   Headache    History of blood transfusion    Hypertension    Malaise and fatigue    Orthostatic hypotension    Shortness of breath    " when I walk to fast"   Sleep apnea    Syncope    Tubulovillous adenoma of colon     Patient Active Problem List   Diagnosis Date Noted   GI bleed 01/31/2021   Gastric intestinal metaplasia 10/18/2020   Dyspepsia 10/18/2020   Chronic constipation 10/18/2020   Nausea without vomiting 05/29/2020   Generalized osteoarthritis of multiple sites 03/27/2020   Chronic pain  disorder 03/27/2020   Acute GI bleeding 01/13/2020   Duodenal ulcer with hemorrhage    Lower GI bleed 01/12/2020   Monitoring for long-term anticoagulant use 01/03/2020   Intestinal metaplasia of gastric mucosa 12/04/2019   Gastric polyp 12/04/2019   Tubulovillous adenoma of small intestine 12/03/2019   Adenomatous duodenal polyp 12/03/2019   Abnormal endoscopy of upper gastrointestinal tract 12/03/2019   Depression, major, recurrent, moderate ([place]) 09/22/2018   Orthostatic hypotension 09/16/2018   Bilateral lower extremity edema 08/24/2018   Abdominal pain, diffuse 05/11/2018   Body mass index 40.0-44.9, adult ([place]) 03/18/2018   Unilateral primary osteoarthritis, right knee 03/18/2018   Chronic pain of right knee 03/18/2018   Abnormal facial hair 11/10/2017   Chronic anticoagulation 11/10/2017   GERD (gastroesophageal reflux disease) 06/16/2017   Constipation 10/16/2016   Morbid obesity ([place]) 10/16/2016   Diverticulitis of large intestine with perforation 06/26/2016   Anemia in other chronic diseases classified elsewhere 06/25/2016   Chest pain with moderate risk for cardiac etiology 06/25/2016   ESRD on hemodialysis ([place]) 06/30/2012   Paroxysmal atrial fibrillation ([place]) 06/30/2012   Hypertension with renal disease 06/30/2012    Past  Surgical History:  Procedure Laterality Date   A/V FISTULAGRAM Right 10/18/2020   Procedure: A/V FISTULAGRAM;  Surgeon: Marty Heck, MD;  Location: [facility];  Service: Cardiovascular;  Laterality: Right;   AV FISTULA PLACEMENT     BACK SURGERY     Lumbar fusion L 4 and L 5   BIOPSY  01/09/2020   Procedure: BIOPSY;  Surgeon: Rush Landmark Telford Nab., MD;  Location: [facility];  Service: Gastroenterology;;   BIOPSY  01/31/2021   Procedure: BIOPSY;  Surgeon: Irving Copas., MD;  Location: [facility];  Service: Gastroenterology;;   COLONOSCOPY     COLONOSCOPY N/A 02/01/2021   Procedure: COLONOSCOPY;  Surgeon:  Yetta Flock, MD;  Location: [facility];  Service: Gastroenterology;  Laterality: N/A;   ENDOSCOPIC MUCOSAL RESECTION N/A 01/09/2020   Procedure: ENDOSCOPIC MUCOSAL RESECTION;  Surgeon: Rush Landmark Telford Nab., MD;  Location: [facility];  Service: Gastroenterology;  Laterality: N/A;   ENDOSCOPIC MUCOSAL RESECTION N/A 01/31/2021   Procedure: ENDOSCOPIC MUCOSAL RESECTION;  Surgeon: Rush Landmark Telford Nab., MD;  Location: [facility];  Service: Gastroenterology;  Laterality: N/A;   ESOPHAGOGASTRODUODENOSCOPY     ESOPHAGOGASTRODUODENOSCOPY (EGD) WITH PROPOFOL N/A 01/09/2020   Procedure: ESOPHAGOGASTRODUODENOSCOPY (EGD) WITH PROPOFOL;  Surgeon: Rush Landmark Telford Nab., MD;  Location: [facility];  Service: Gastroenterology;  Laterality: N/A;   ESOPHAGOGASTRODUODENOSCOPY (EGD) WITH PROPOFOL N/A 01/13/2020   Procedure: ESOPHAGOGASTRODUODENOSCOPY (EGD) WITH PROPOFOL;  Surgeon: Irene Shipper, MD;  Location: [facility];  Service: Gastroenterology;  Laterality: N/A;   ESOPHAGOGASTRODUODENOSCOPY (EGD) WITH PROPOFOL N/A 01/31/2021   Procedure: ESOPHAGOGASTRODUODENOSCOPY (EGD) WITH PROPOFOL;  Surgeon: Rush Landmark Telford Nab., MD;  Location: [facility];  Service: Gastroenterology;  Laterality: N/A;   EUS N/A 01/09/2020   Procedure: UPPER ENDOSCOPIC ULTRASOUND (EUS) RADIAL;  Surgeon: Irving Copas., MD;  Location: [facility];  Service: Gastroenterology;  Laterality: N/A;   HEMOSTASIS CLIP PLACEMENT  01/09/2020   Procedure: HEMOSTASIS CLIP PLACEMENT;  Surgeon: Irving Copas., MD;  Location: [facility];  Service: Gastroenterology;;   HEMOSTASIS CLIP PLACEMENT  01/31/2021   Procedure: HEMOSTASIS CLIP PLACEMENT;  Surgeon: Irving Copas., MD;  Location: [facility];  Service: Gastroenterology;;   HEMOSTASIS CONTROL  01/13/2020   Procedure: HEMOSTASIS CONTROL;  Surgeon: Irene Shipper, MD;  Location: [facility] ENDOSCOPY;  Service: Gastroenterology;;  hemaspray   HOT HEMOSTASIS N/A  01/13/2020   Procedure: HOT HEMOSTASIS (ARGON PLASMA COAGULATION/BICAP);  Surgeon: Irene Shipper, MD;  Location: [facility];  Service: Gastroenterology;  Laterality: N/A;   HOT HEMOSTASIS N/A 02/01/2021   Procedure: HOT HEMOSTASIS (ARGON PLASMA COAGULATION/BICAP);  Surgeon: Yetta Flock, MD;  Location: [facility] ENDOSCOPY;  Service: Gastroenterology;  Laterality: N/A;   IR GENERIC HISTORICAL  07/09/2016   IR US GUIDE VASC ACCESS RIGHT 07/09/2016 Arne Cleveland, MD [facility]   IR GENERIC HISTORICAL  07/09/2016   IR FLUORO GUIDE CV LINE RIGHT 07/09/2016 Arne Cleveland, MD [facility]   KNEE ARTHROPLASTY Left    LAPAROSCOPIC SIGMOID COLECTOMY N/A 07/11/2016   Procedure: LAPAROSCOPIC SIGMOID COLECTOMY;  Surgeon: Clovis Riley, MD;  Location: [facility];  Service: General;  Laterality: N/A;   MASS EXCISION Right 04/10/2020   Procedure: EXCISION SKIN NODULE RIGHT FOREARM;  Surgeon: Waynetta Sandy, MD;  Location: [facility];  Service: Vascular;  Laterality: Right;   SCLEROTHERAPY  01/09/2020   Procedure: Clide Deutscher;  Surgeon: Mansouraty, Telford Nab., MD;  Location: [facility] ENDOSCOPY;  Service: Gastroenterology;;   Clide Deutscher  01/13/2020   Procedure: Clide Deutscher;  Surgeon: Irene Shipper, MD;  Location: [facility] ENDOSCOPY;  Service: Gastroenterology;;   SUBMUCOSAL LIFTING INJECTION  01/09/2020   Procedure: SUBMUCOSAL LIFTING INJECTION;  Surgeon: Irving Copas., MD;  Location: [facility] ENDOSCOPY;  Service: Gastroenterology;;   TUBAL LIGATION       OB History   No obstetric history on file.     Family History  Problem Relation Age of Onset   Heart failure Mother    Stroke Mother    Other Father    Colon cancer Neg Hx    Liver disease Neg Hx    Esophageal cancer Neg Hx    Stomach cancer Neg Hx    Inflammatory bowel disease Neg Hx    Rectal cancer Neg Hx    Pancreatic cancer Neg Hx     Social History   Tobacco Use   Smoking status: Never   Smokeless tobacco: Never  Vaping Use    Vaping Use: Never used  Substance Use Topics   Alcohol use: No   Drug use: No    Home Medications Prior to Admission medications   Medication Sig Start Date End Date Taking? Authorizing Provider  amiodarone (PACERONE) 200 MG tablet Take 1 tablet (200 mg total) by mouth daily. 01/10/21 07/09/21 Yes Adrian Prows, MD  B Complex-C-Zn-Folic Acid (DIALYVITE 950 WITH ZINC) 0.8 MG TABS Take 1 tablet by mouth daily. 07/19/20  Yes [provider]  carvedilol (COREG) 25 MG tablet Take 1.5 tablets (37.5 mg total) by mouth 2 (two) times daily with a meal. 11/29/20  Yes Adrian Prows, MD  diltiazem (CARDIZEM) 60 MG tablet Take 1 tablet (60 mg total) by mouth 3 (three) times daily as needed (Atrial fibrillation). 11/29/20  Yes Adrian Prows, MD  DULoxetine (CYMBALTA) 20 MG capsule Take 2 capsules (40 mg total) by mouth daily. 10/23/20  Yes [name], [name], MD  ethyl chloride spray Apply 1 application topically every Monday, Wednesday, and Friday with hemodialysis. 12/25/17  Yes [provider]  Heparin Sod, Porcine, in D5W (HEPARIN SODIUM/D5W IV) Heparin Sodium (Porcine) 1,000 Units/mL Systemic 02/13/21 02/12/22 Yes [provider]  hydrALAZINE (APRESOLINE) 25 MG tablet Take 1 tablet (25 mg total) by mouth 3 (three) times daily. 11/29/20  Yes Adrian Prows, MD  isosorbide dinitrate (ISORDIL) 30 MG tablet Take 1 tablet (30 mg total) by mouth 3 (three) times daily. 11/29/20  Yes Adrian Prows, MD  LINZESS 145 MCG CAPS capsule TAKE 1 CAPSULE BY MOUTH ONCE DAILY BEFORE BREAKFAST Patient taking differently: Take 145 mcg by mouth daily before breakfast. 02/18/21  Yes Mansouraty, Telford Nab., MD  omeprazole (PRILOSEC) 40 MG capsule TAKE 1 CAPSULE BY MOUTH TWICE DAILY BEFORE A MEAL Patient taking differently: Take 40 mg by mouth in the morning and at bedtime. Take before a meal 04/10/21  Yes Mansouraty, Telford Nab., MD  traMADol (ULTRAM) 50 MG tablet Take 1 tablet (50 mg total) by mouth 3 (three) times daily as  needed. 02/28/21  Yes Kirsteins, Luanna Salk, MD  warfarin (COUMADIN) 5 MG tablet USE AS DIRECTED BY COUMADIN CLINIC Patient taking differently: Take 2.5-5 mg by mouth See admin instructions. Currently on: 5 mg every Mon, Wed; 2.5 mg all other days 11/09/20  Yes [name], [name], MD  albuterol (PROVENTIL HFA;VENTOLIN HFA) 108 (90 Base) MCG/ACT inhaler Inhale 1 puff into the lungs every 6 (six) hours as needed for wheezing or shortness of breath. 01/26/18   [name], [name], MD  amLODipine (NORVASC) 10 MG tablet Take 1 tablet (10 mg total) by mouth daily. Patient not  taking: No sig reported 01/10/21 01/05/22  Adrian Prows, MD  fluconazole (DIFLUCAN) 150 MG tablet Take 150 mg by mouth once. Patient not taking: No sig reported 03/04/21   [provider]    Allergies    Latex, Penicillins, Sulfa antibiotics, and Tape  Review of Systems   Review of Systems  Musculoskeletal:  Positive for back pain and neck pain.  All other systems reviewed and are negative.  Physical Exam Updated Vital Signs BP (!) 152/58 (BP Location: Left Arm)   Pulse 66   Temp 97.9 F (36.6 C)   Resp 15   SpO2 98%   Physical Exam Vitals and nursing note reviewed.  Constitutional:      Comments: Chronically ill and uncomfortable  HENT:     Head: Normocephalic.     Nose: Nose normal.     Mouth/Throat:     Mouth: Mucous membranes are moist.  Eyes:     Extraocular Movements: Extraocular movements intact.     Pupils: Pupils are equal, round, and reactive to light.  Neck:     Comments: Mild left paracervical tenderness.  Mild tenderness over the left trapezius muscle. Cardiovascular:     Rate and Rhythm: Normal rate and regular rhythm.     Pulses: Normal pulses.  Pulmonary:     Effort: Pulmonary effort is normal.     Breath sounds: Normal breath sounds.  Abdominal:     General: Abdomen is flat.     Palpations: Abdomen is soft.  Musculoskeletal:     Cervical back: Normal range of motion.     Comments: Left arm  fistula in place with good bruit.  Patient has no midline thoracic tenderness.  Just tenderness over the left trapezius.  Mild left paralumbar lumbar tenderness.  Patient has positive straight leg raise bilateral legs.  Patient has no saddle anesthesia.  Patient has good reflexes bilateral knees.  Skin:    General: Skin is warm.     Capillary Refill: Capillary refill takes less than 2 seconds.  Neurological:     General: No focal deficit present.     Mental Status: She is oriented to person, place, and time.  Psychiatric:        Mood and Affect: Mood normal.        Behavior: Behavior normal.    ED Results / Procedures / Treatments   Labs (all labs ordered are listed, but only abnormal results are displayed) Labs Reviewed  CBC WITH DIFFERENTIAL/PLATELET - Abnormal; Notable for the following components:      Result Value   Hemoglobin 11.8 (*)    MCHC 29.9 (*)    RDW 20.1 (*)    All other components within normal limits  BASIC METABOLIC PANEL - Abnormal; Notable for the following components:   Potassium 3.3 (*)    Glucose, Bld 110 (*)    BUN 36 (*)    Creatinine, Ser 9.77 (*)    GFR, Estimated 4 (*)    All other components within normal limits    EKG None  Radiology CT Cervical Spine Wo Contrast  Result Date: 04/29/2021 CLINICAL DATA:  Neck pain EXAM: CT CERVICAL SPINE WITHOUT CONTRAST TECHNIQUE: Multidetector CT imaging of the cervical spine was performed without intravenous contrast. Multiplanar CT image reconstructions were also generated. COMPARISON:  No prior CT cervical spine, correlation is made with 09/11/2017 radiograph FINDINGS: Evaluation of C6-T1 is somewhat limited by beam attenuation. Alignment: Reversal of the normal cervical lordosis, centered on C4-C5, where there is  approximately 2 mm anterolisthesis C4 on C5. Skull base and vertebrae: Osteopenia. No acute fracture. No primary bone lesion or focal pathologic process. Degenerative changes, most prominently at the  T1-T2 endplates and J6-O1 endplates, with bony sclerosis. Soft tissues and spinal canal: No prevertebral fluid or swelling. No visible canal hematoma. Disc levels: Multilevel degenerative changes, with severe disc height loss at C5-C6, T1-T2, and T2-T3. Multilevel facet and uncovertebral hypertrophy. Mild spinal canal stenosis and C3-C4, C4-C5, and C6-C7. Mild right neural foraminal narrowing at C3-C4. Mild left neural foraminal narrowing at C4-C5. Upper chest: No focal pulmonary opacity.  Aortic atherosclerosis. Other: Carotid artery calcifications. IMPRESSION: 1. No acute fracture or static listhesis. 2. Multilevel degenerative changes, with mild spinal canal stenosis at C3-C4, C4-C5, and C6-C7. 3. Carotid atherosclerosis.  Aortic Atherosclerosis (ICD10-I70.0). Electronically Signed   By: Merilyn Baba M.D.   On: 04/29/2021 12:34    Procedures Procedures   Medications Ordered in ED Medications  morphine 4 MG/ML injection 4 mg (4 mg Intravenous Given 04/29/21 1605)    ED Course  I have reviewed the triage vital signs and the nursing notes.  Pertinent labs & imaging results that were available during my care of the patient were reviewed by me and considered in my medical decision making (see chart for details).    MDM Rules/Calculators/A&P                           ANAISSA MACFADDEN is a 75 y.o. female here presenting with left-sided neck pain and back pain.  Patient has a history of chronic back pain with known sciatica.  Patient has no neurodeficits to warrant an MRI.  I think likely pain from arthritis.  Patient states that she did not do well with steroids previously.  She is only on tramadol right now.  Will give pain medicine and check CT neck and basic blood work  5:19 PM CT showed multilevel degenerative changes.  Pain is controlled with pain medicine.  Labs at baseline.  Patient does not need emergent dialysis.  We will discharge patient home with some pain medicine and muscle relaxant.   She states that she does not want steroids and she had side effects from steroids previously.  We will refer to neurosurgery outpatient   Final Clinical Impression(s) / ED Diagnoses Final diagnoses:  None    Rx / DC Orders ED Discharge Orders     None        Drenda Freeze, MD 04/29/21 1572

## 2021-04-29 NOTE — ED Provider Notes (Signed)
Emergency Medicine Provider Triage Evaluation Note  Jane [name] , a 75 y.o. female  was evaluated in triage.  Pt complains of gradual onset, constant, achy, neck pain x2 to 3 weeks.  Woke up this morning with worsening pain.  Called EMS.  She is supposed to have dialysis today however due to the pain did not want to go.  She has chronic back pain and sees someone for this.  He has been applying warm compresses and using her TENS unit without relief.  She denies any weakness, numbness, tingling down her extremities.  EMS reports that she was having difficulty standing however patient states that this is typical for her since having back pain in June..  Review of Systems  Positive: + neck pain Negative: - weakness, numbness, tingling  Physical Exam  There were no vitals taken for this visit. Gen:   Awake, no distress   Resp:  Normal effort  MSK:   Moves extremities without difficulty  Other:  Mild C spine TTP  Medical Decision Making  Medically screening exam initiated at 10:46 AM.  Appropriate orders placed.  Jane [name] was informed that the remainder of the evaluation will be completed by another provider, this initial triage assessment does not replace that evaluation, and the importance of remaining in the ED until their evaluation is complete.     Eustaquio Maize, PA-C 04/29/21 [place], Butts, DO 04/29/21 1246

## 2021-04-29 NOTE — ED Triage Notes (Signed)
EMS stated, Lower back pain since June. Today can stand with help.Sees Jane [name] .   Missed dialysis today but missed due to pain.

## 2021-04-29 NOTE — Discharge Instructions (Addendum)
Take Norco for pain.  Take Flexeril for muscle spasm  You have a lot of arthritis in your spine causing your pain  Go to dialysis as scheduled  Please follow-up with spine doctor or pain management doctor  Return to ER if you have worse back pain, numbness, weakness, trouble urinating

## 2021-04-30 ENCOUNTER — Ambulatory Visit: Payer: Medicare Other | Admitting: Pharmacist

## 2021-04-30 DIAGNOSIS — Z5181 Encounter for therapeutic drug level monitoring: Secondary | ICD-10-CM

## 2021-04-30 DIAGNOSIS — Z7901 Long term (current) use of anticoagulants: Secondary | ICD-10-CM | POA: Diagnosis not present

## 2021-04-30 DIAGNOSIS — I480 Paroxysmal atrial fibrillation: Secondary | ICD-10-CM

## 2021-04-30 LAB — POCT INR: INR: 2.4 (ref 2.0–3.0)

## 2021-04-30 NOTE — Progress Notes (Signed)
Anticoagulation Management Jane [name] is a 75 y.o. female who reports to the clinic for monitoring of warfarin treatment.    Indication: atrial fibrillation CHA2DS2 Vasc Score 4 (Age >55, female, HTN hx), HAS-BLED 2 (Age>65, renal disease)  Duration: indefinite Supervising physician: Adrian Prows  Anticoagulation Clinic Visit History:  Patient does report signs/symptoms of bleeding or thromboembolism.  Reports no changes in diet, medications, or lifestyle.   Pt with recent ER admission for severe back pain. Was discharged with flexeril and norco. Waiting for spine specialist referral. Pain still present but improved with norco and flexeril.   Pt reports that she missed her dose last Friday. Pt also reports that she had a fall over the weekend due to pain and numbness. Denies any head trauma.   Amlodipine stopped per nephrology due to increased leg swelling.  Anticoagulation Episode Summary     Current INR goal:  2.0-3.0  TTR:  66.3 % (2.6 y)  Next INR check:  05/16/2021  INR from last check:  2.4 (04/30/2021)  Weekly max warfarin dose:    Target end date:  Indefinite  INR check location:    Preferred lab:    Send INR reminders to:     Indications   Paroxysmal atrial fibrillation (HCC) [I48.0] Monitoring for long-term anticoagulant use [Z51.81 Z79.01]        Comments:           Allergies  Allergen Reactions   Latex Rash   Penicillins Other (See Comments)    Yeast infection / Childhood   Sulfa Antibiotics Rash   Tape Other (See Comments)    Plastic, silicone, and paper tape causes bruising and pulls off skin. Cloth tape works fine    Current Outpatient Medications:    CALCITRIOL PO, Take by mouth 3 (three) times a week., Disp: , Rfl:    albuterol (PROVENTIL HFA;VENTOLIN HFA) 108 (90 Base) MCG/ACT inhaler, Inhale 1 puff into the lungs every 6 (six) hours as needed for wheezing or shortness of breath., Disp: 6.7 g, Rfl: 2   amiodarone (PACERONE) 200 MG tablet, Take 1  tablet (200 mg total) by mouth daily., Disp: 90 tablet, Rfl: 1   amLODipine (NORVASC) 10 MG tablet, Take 1 tablet (10 mg total) by mouth daily. (Patient not taking: No sig reported), Disp: 90 tablet, Rfl: 3   B Complex-C-Zn-Folic Acid (DIALYVITE 355 WITH ZINC) 0.8 MG TABS, Take 1 tablet by mouth daily., Disp: , Rfl:    carvedilol (COREG) 25 MG tablet, Take 1.5 tablets (37.5 mg total) by mouth 2 (two) times daily with a meal., Disp: 240 tablet, Rfl: 3   cyclobenzaprine (FLEXERIL) 5 MG tablet, Take 1 tablet (5 mg total) by mouth 3 (three) times daily as needed for muscle spasms., Disp: 10 tablet, Rfl: 0   diltiazem (CARDIZEM) 60 MG tablet, Take 1 tablet (60 mg total) by mouth 3 (three) times daily as needed (Atrial fibrillation)., Disp: 30 tablet, Rfl: 2   DULoxetine (CYMBALTA) 20 MG capsule, Take 2 capsules (40 mg total) by mouth daily., Disp: 180 capsule, Rfl: 2   ethyl chloride spray, Apply 1 application topically every Monday, Wednesday, and Friday with hemodialysis., Disp: , Rfl: 12   fluconazole (DIFLUCAN) 150 MG tablet, Take 150 mg by mouth once. (Patient not taking: No sig reported), Disp: , Rfl:    Heparin Sod, Porcine, in D5W (HEPARIN SODIUM/D5W IV), Heparin Sodium (Porcine) 1,000 Units/mL Systemic, Disp: , Rfl:    hydrALAZINE (APRESOLINE) 25 MG tablet, Take 1 tablet (25  mg total) by mouth 3 (three) times daily., Disp: 270 tablet, Rfl: 3   HYDROcodone-acetaminophen (NORCO/VICODIN) 5-325 MG tablet, Take 1 tablet by mouth every 6 (six) hours as needed., Disp: 10 tablet, Rfl: 0   isosorbide dinitrate (ISORDIL) 30 MG tablet, Take 1 tablet (30 mg total) by mouth 3 (three) times daily., Disp: 270 tablet, Rfl: 3   LINZESS 145 MCG CAPS capsule, TAKE 1 CAPSULE BY MOUTH ONCE DAILY BEFORE BREAKFAST (Patient taking differently: Take 145 mcg by mouth daily before breakfast.), Disp: 30 capsule, Rfl: 0   omeprazole (PRILOSEC) 40 MG capsule, TAKE 1 CAPSULE BY MOUTH TWICE DAILY BEFORE A MEAL (Patient taking  differently: Take 40 mg by mouth in the morning and at bedtime. Take before a meal), Disp: 60 capsule, Rfl: 0   traMADol (ULTRAM) 50 MG tablet, Take 1 tablet (50 mg total) by mouth 3 (three) times daily as needed., Disp: 90 tablet, Rfl: 1   warfarin (COUMADIN) 5 MG tablet, USE AS DIRECTED BY COUMADIN CLINIC (Patient taking differently: Take 2.5-5 mg by mouth See admin instructions. Currently on: 5 mg every Mon, Wed; 2.5 mg all other days), Disp: 113 tablet, Rfl: 5 Past Medical History:  Diagnosis Date   Acid reflux    Anemia of chronic disease    Arthritis    Asthma    Atrial fibrillation (HCC)    Bilateral carotid bruits    Complication of anesthesia    "hard to wake up, I have sleep apnea" no CPAP   Depression    Diverticulitis    Duodenal ulcer    Dysrhythmia    Afib   ESRD (end stage renal disease) ([place])    MWF [place]   Headache    History of blood transfusion    Hypertension    Malaise and fatigue    Orthostatic hypotension    Shortness of breath    " when I walk to fast"   Sleep apnea    Syncope    Tubulovillous adenoma of colon    ASSESSMENT  Recent Results: The most recent result is correlated with 22.5 mg per week:  Lab Results  Component Value Date   INR 2.4 04/30/2021   INR 2.6 04/02/2021   INR 2.5 03/14/2021    Anticoagulation Dosing: Description   INR at goal. Continue taking 5 mg every Mon, Wed and 2.5 mg all other days. Recheck INR in 2 weeks.       INR today: Therapeutic. Recent missed dose and thus unsure about true anticoagulation effect. Previously stable on current weekly dose. Pt denies any complains of relevant bleeding or bruising symptoms. Denies any other relevant changes in diet, medications, or lifestyle. Pt verbalized prescribed warfarin dose. Will continue current weekly dose and close monitoring in setting of recent missed dose and recent falls.    PLAN Weekly dose was unchanged by 0 % to 22.5 mg/week. Continue weekly dose of  5 mg every Mon, Wed and 2.5 mg all other days. Recheck INR in 2 week.     Patient Instructions  INR at goal. Continue taking 5 mg every Mon, Wed and 2.5 mg all other days. Recheck INR in 2 weeks.  Patient advised to contact clinic or seek medical attention if signs/symptoms of bleeding or thromboembolism occur.  Patient verbalized understanding by repeating back information and was advised to contact me if further medication-related questions arise.   Follow-up Return in about 30 days (around 05/30/2021).  [name], PharmD  15 minutes spent  face-to-face with the patient during the encounter. 50% of time spent on education, including signs/sx bleeding and clotting, as well as food and drug interactions with warfarin. 50% of time was spent on fingerprick POC INR sample collection,processing, results determination, and documentation

## 2021-04-30 NOTE — Patient Instructions (Addendum)
INR at goal. Continue taking 5 mg every Mon, Wed and 2.5 mg all other days. Recheck INR in 2 weeks.

## 2021-05-01 DIAGNOSIS — Z23 Encounter for immunization: Secondary | ICD-10-CM | POA: Diagnosis not present

## 2021-05-01 DIAGNOSIS — N186 End stage renal disease: Secondary | ICD-10-CM | POA: Diagnosis not present

## 2021-05-01 DIAGNOSIS — N2581 Secondary hyperparathyroidism of renal origin: Secondary | ICD-10-CM | POA: Diagnosis not present

## 2021-05-01 DIAGNOSIS — D631 Anemia in chronic kidney disease: Secondary | ICD-10-CM | POA: Diagnosis not present

## 2021-05-01 DIAGNOSIS — Z992 Dependence on renal dialysis: Secondary | ICD-10-CM | POA: Diagnosis not present

## 2021-05-01 DIAGNOSIS — E1129 Type 2 diabetes mellitus with other diabetic kidney complication: Secondary | ICD-10-CM | POA: Diagnosis not present

## 2021-05-01 DIAGNOSIS — I4891 Unspecified atrial fibrillation: Secondary | ICD-10-CM | POA: Diagnosis not present

## 2021-05-03 DIAGNOSIS — Z992 Dependence on renal dialysis: Secondary | ICD-10-CM | POA: Diagnosis not present

## 2021-05-03 DIAGNOSIS — N186 End stage renal disease: Secondary | ICD-10-CM | POA: Diagnosis not present

## 2021-05-03 DIAGNOSIS — Z23 Encounter for immunization: Secondary | ICD-10-CM | POA: Diagnosis not present

## 2021-05-03 DIAGNOSIS — D631 Anemia in chronic kidney disease: Secondary | ICD-10-CM | POA: Diagnosis not present

## 2021-05-03 DIAGNOSIS — N2581 Secondary hyperparathyroidism of renal origin: Secondary | ICD-10-CM | POA: Diagnosis not present

## 2021-05-06 DIAGNOSIS — Z992 Dependence on renal dialysis: Secondary | ICD-10-CM | POA: Diagnosis not present

## 2021-05-06 DIAGNOSIS — N186 End stage renal disease: Secondary | ICD-10-CM | POA: Diagnosis not present

## 2021-05-06 DIAGNOSIS — Z23 Encounter for immunization: Secondary | ICD-10-CM | POA: Diagnosis not present

## 2021-05-06 DIAGNOSIS — N2581 Secondary hyperparathyroidism of renal origin: Secondary | ICD-10-CM | POA: Diagnosis not present

## 2021-05-06 DIAGNOSIS — D631 Anemia in chronic kidney disease: Secondary | ICD-10-CM | POA: Diagnosis not present

## 2021-05-07 ENCOUNTER — Encounter: Payer: Medicare Other | Attending: Physical Medicine & Rehabilitation | Admitting: Physical Medicine & Rehabilitation

## 2021-05-07 ENCOUNTER — Encounter: Payer: Self-pay | Admitting: Physical Medicine & Rehabilitation

## 2021-05-07 ENCOUNTER — Other Ambulatory Visit: Payer: Self-pay

## 2021-05-07 ENCOUNTER — Telehealth: Payer: Self-pay

## 2021-05-07 ENCOUNTER — Ambulatory Visit: Payer: Medicare Other | Admitting: Family Medicine

## 2021-05-07 VITALS — BP 158/66 | HR 64 | Temp 98.4°F | Ht 65.0 in | Wt 180.0 lb

## 2021-05-07 DIAGNOSIS — M533 Sacrococcygeal disorders, not elsewhere classified: Secondary | ICD-10-CM | POA: Insufficient documentation

## 2021-05-07 DIAGNOSIS — M5440 Lumbago with sciatica, unspecified side: Secondary | ICD-10-CM | POA: Diagnosis not present

## 2021-05-07 DIAGNOSIS — G8929 Other chronic pain: Secondary | ICD-10-CM | POA: Insufficient documentation

## 2021-05-07 NOTE — Progress Notes (Signed)
  PROCEDURE RECORD [facility]   Name: Jane [name] DOB:06-04-1946 MRN: [id number]  Date:05/07/2021  Physician: [name], MD    Nurse/CMA: Betti Cruz  Allergies:  Allergies  Allergen Reactions   Latex Rash   Penicillins Other (See Comments)    Yeast infection / Childhood   Sulfa Antibiotics Rash   Tape Other (See Comments)    Plastic, silicone, and paper tape causes bruising and pulls off skin. Cloth tape works fine    Consent Signed: Yes.    Is patient diabetic? No.  CBG today? N/A  Pregnant: No. LMP: No LMP recorded. Patient is postmenopausal. (age 16-55)  Anticoagulants: yes (Warfarin at 4 last night) Anti-inflammatory: no Antibiotics: no  Procedure:  Left sacroiliac injection   Position: Prone Start Time: 12:18 pm  End Time: 12;23 pm  Fluoro Time: 22  RN/CMA Marvis Moeller, CMA    Time 11:06 am 12:30 pm    BP 158/66 120/64    Pulse 64 61    Respirations 16 16    O2 Sat 96 98    S/S 6 6    Pain Level 10/10 810     D/C home with Daughter, patient A & O X 3, D/C instructions reviewed, and sits independently.

## 2021-05-07 NOTE — Patient Instructions (Signed)
Sacroiliac injection was performed today. A combination of numbing medicine (lidocaine) plus a cortisone medicine (betamethasone) was injected. The injection was done under x-ray guidance. This procedure has been performed to help reduce low back and buttocks pain as well as potentially hip pain. The duration of this injection is variable lasting from hours to  Months. It may repeated if needed. 

## 2021-05-07 NOTE — Telephone Encounter (Signed)
Per Daughter  today Jane [name] may have only #6 Tramadol 50 mg on hand. She is also out of the Hydrocodone & Flexeril.   If she is to continue taking the medications will you send in refills?  Call back phone [phone].

## 2021-05-07 NOTE — Progress Notes (Signed)
Left sacroiliac injection under fluoroscopic guidance  Indication: Left Low back and buttocks pain not relieved by medication management and other conservative care.  Informed consent was obtained after describing risks and benefits of the procedure with the patient, this includes bleeding, bruising, infection, paralysis and medication side effects. The patient wishes to proceed and has given written consent. The patient was placed in a prone position. The lumbar and sacral area was marked and prepped with Betadine. A 25-gauge 1-1/2 inch needle was inserted into the skin and subcutaneous tissue and 1 mL of 1% lidocaine was injected. Then a 25-gauge 3 inch spinal needle was inserted under fluoroscopic guidance into the left sacroiliac joint. AP and lateral images were utilized. Isovue 200x0.5 mL under live fluoroscopy demonstrated no intravascular uptake. Then a solution containing one ML of 6 mg per mLbetamethasone and 2 ML of 2% lidocaine MPF was injected x1.5 mL. Patient tolerated the procedure well. Post procedure instructions were given. Please see post procedure form.   Patient earlier ED visit for severe back pain.  She has radiation down toward the knees as well.  No bowel or bladder dysfunction.  History of kidney disease.  She states that she is having difficulty talking.  We will get MRI lumbar spine.

## 2021-05-08 ENCOUNTER — Telehealth: Payer: Self-pay

## 2021-05-08 ENCOUNTER — Encounter: Payer: Self-pay | Admitting: Gastroenterology

## 2021-05-08 ENCOUNTER — Ambulatory Visit (INDEPENDENT_AMBULATORY_CARE_PROVIDER_SITE_OTHER): Payer: Medicare Other | Admitting: Gastroenterology

## 2021-05-08 VITALS — BP 122/60 | HR 66 | Ht 65.0 in | Wt 181.4 lb

## 2021-05-08 DIAGNOSIS — D1330 Benign neoplasm of unspecified part of small intestine: Secondary | ICD-10-CM | POA: Diagnosis not present

## 2021-05-08 DIAGNOSIS — R1310 Dysphagia, unspecified: Secondary | ICD-10-CM | POA: Insufficient documentation

## 2021-05-08 DIAGNOSIS — N186 End stage renal disease: Secondary | ICD-10-CM | POA: Diagnosis not present

## 2021-05-08 DIAGNOSIS — N2581 Secondary hyperparathyroidism of renal origin: Secondary | ICD-10-CM | POA: Diagnosis not present

## 2021-05-08 DIAGNOSIS — R634 Abnormal weight loss: Secondary | ICD-10-CM

## 2021-05-08 DIAGNOSIS — Z992 Dependence on renal dialysis: Secondary | ICD-10-CM | POA: Diagnosis not present

## 2021-05-08 DIAGNOSIS — K219 Gastro-esophageal reflux disease without esophagitis: Secondary | ICD-10-CM | POA: Diagnosis not present

## 2021-05-08 DIAGNOSIS — D631 Anemia in chronic kidney disease: Secondary | ICD-10-CM | POA: Diagnosis not present

## 2021-05-08 DIAGNOSIS — Z23 Encounter for immunization: Secondary | ICD-10-CM | POA: Diagnosis not present

## 2021-05-08 NOTE — Patient Instructions (Signed)
You have been scheduled for an endoscopy. Please follow written instructions given to you at your visit today. If you use inhalers (even only as needed), please bring them with you on the day of your procedure.  If you are age 76 or older, your body mass index should be between 23-30. Your Body mass index is 30.19 kg/m. If this is out of the aforementioned range listed, please consider follow up with your Primary Care Provider.  You will be contaced by our office prior to your procedure for directions on holding your Coumadin/Warfarin.  If you do not hear from our office 1 week prior to your scheduled procedure, please call [phone] to discuss.  ________________________________________________________  The [facility] providers would like to encourage you to use [facility] to communicate with providers for non-urgent requests or questions.  Due to long hold times on the telephone, sending your provider a message by [facility] may be a faster and more efficient way to get a response.  Please allow 48 business hours for a response.  Please remember that this is for non-urgent requests.   Thank you for choosing me and [facility].  Dr. Rush Landmark

## 2021-05-08 NOTE — Telephone Encounter (Signed)
Request for surgical clearance:     Endoscopy Procedure  What type of surgery is being performed?     EGD with Dil     When is this surgery scheduled?     05/31/21  What type of clearance is required ?   Pharmacy  Are there any medications that need to be held prior to surgery and how long?Warfarin x5 days prior to procedure   Practice name and name of physician performing surgery?      [facility]-Dr Pyrtle   What is your office phone and fax number?      Phone- [phone]  [phone]  Anesthesia type (None, local, MAC, general) ?       MAC

## 2021-05-08 NOTE — Progress Notes (Signed)
Filed Weights   05/08/21 1452  Weight: 181 lb 6.4 oz (82.3 kg)

## 2021-05-08 NOTE — Progress Notes (Deleted)
Appointment cancelled

## 2021-05-08 NOTE — Progress Notes (Signed)
[facility] VISIT   Primary Care Provider [name], [name], MD [place] [address] [phone]  Referring Provider Dr. Hilarie Fredrickson  Patient Profile: Jane [name] is a 75 y.o. 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  The patient presents to the [facility] for an evaluation and management of problem(s) noted below:  Problem List 1. Dysphagia, unspecified type   2. Unintentional weight loss   3. Gastroesophageal reflux disease without esophagitis   4. Tubulovillous adenoma of small intestine     History of Present Illness Please see prior notes by Dr. Hilarie Fredrickson and myself for full details of HPI.  Interval History Today, the patient returns for an unscheduled visit.  When I had last seen the patient I have performed an upper endoscopy on her in July and thankfully her TVA of the duodenum did not look like it had recurred.  However she was experiencing some GI bleeding and we brought her into the hospital and she underwent a colonoscopy the next day and was found to have a bleeding vessel in the splenic flexure that was treated.  Unfortunately her preparation was poor.  Her bleeding stopped and she was able to be discharged.  Thankfully she has not had any recurrence of those symptoms.  Today, she was scheduled with me rather than Dr. Hilarie Fredrickson for unclear reasons as Dr. Hilarie Fredrickson is her primary gastroenterologist.  With that being said, the main symptom the patient is experiencing currently is dysphagia with unintentional weight loss.  She has been experiencing episodes of solid food getting caught in the area of her sternal notch which she has not had to regurgitate at all times but has had episodes in the past.  She has also  described issues of liquid taking longer to pass.  She has been experiencing weight loss as a result of her symptoms because she is afraid to eat at times.  She describes when she used to live in [place] that they diagnosed her with some sort of "spasm" of the throat but she could not give any more information about that.  She does experience GERD symptoms even on her medications.  She cannot recall a previous dilation of her esophagus but is wondering if that may be helpful since she is having difficulty swallowing.  She reports prior barium swallow imaging in [place] as well.  She remains on anticoagulation for atrial fibrillation.  She is not taking significant NSAIDs or BC/Goody powders.  GI Review of Systems Positive as above Negative for odynophagia, alteration of bowel habits, melena, hematochezia  Review of Systems General: Denies fevers/chills Cardiovascular: Denies current chest pain Pulmonary: Denies shortness of breath Gastroenterological: See HPI Genitourinary: Denies darkened urine Hematological: Positive for easy bruising/bleeding due to Coumadin Dermatological: Denies jaundice Psychological: Mood is anxious   Medications Current Outpatient Medications  Medication Sig Dispense Refill   albuterol (PROVENTIL HFA;VENTOLIN HFA) 108 (90 Base) MCG/ACT inhaler Inhale 1 puff into the lungs every 6 (six) hours as needed for wheezing or shortness of breath. 6.7 g 2   amiodarone (PACERONE) 200 MG tablet Take 1 tablet (200 mg total) by mouth daily. 90 tablet 1   amLODipine (NORVASC) 10 MG tablet Take 1 tablet (10 mg total) by mouth daily. 90 tablet 3   B Complex-C-Zn-Folic Acid (DIALYVITE 798 WITH ZINC) 0.8  MG TABS Take 1 tablet by mouth daily.     CALCITRIOL PO Take by mouth 3 (three) times a week.     carvedilol (COREG) 25 MG tablet Take 1.5 tablets (37.5 mg total) by mouth 2 (two) times daily with a meal. 240 tablet 3   cyclobenzaprine (FLEXERIL) 5 MG tablet Take by mouth.      diltiazem (CARDIZEM) 60 MG tablet Take 1 tablet (60 mg total) by mouth 3 (three) times daily as needed (Atrial fibrillation). 30 tablet 2   DULoxetine (CYMBALTA) 20 MG capsule Take 2 capsules (40 mg total) by mouth daily. 180 capsule 2   ethyl chloride spray Apply 1 application topically every Monday, Wednesday, and Friday with hemodialysis.  12   Heparin Sod, Porcine, in D5W (HEPARIN SODIUM/D5W IV) Heparin Sodium (Porcine) 1,000 Units/mL Systemic     hydrALAZINE (APRESOLINE) 25 MG tablet Take 1 tablet (25 mg total) by mouth 3 (three) times daily. 270 tablet 3   HYDROcodone-acetaminophen (NORCO/VICODIN) 5-325 MG tablet Take 1 tablet by mouth every 6 (six) hours as needed. 10 tablet 0   isosorbide dinitrate (ISORDIL) 30 MG tablet Take 1 tablet (30 mg total) by mouth 3 (three) times daily. 270 tablet 3   LINZESS 145 MCG CAPS capsule TAKE 1 CAPSULE BY MOUTH ONCE DAILY BEFORE BREAKFAST (Patient taking differently: Take 145 mcg by mouth daily before breakfast.) 30 capsule 0   omeprazole (PRILOSEC) 40 MG capsule TAKE 1 CAPSULE BY MOUTH TWICE DAILY BEFORE A MEAL (Patient taking differently: Take 40 mg by mouth in the morning and at bedtime. Take before a meal) 60 capsule 0   traMADol (ULTRAM) 50 MG tablet Take 1 tablet (50 mg total) by mouth 3 (three) times daily as needed. 90 tablet 1   warfarin (COUMADIN) 5 MG tablet USE AS DIRECTED BY COUMADIN CLINIC (Patient taking differently: Take 2.5-5 mg by mouth See admin instructions. Currently on: 5 mg every Mon, Wed; 2.5 mg all other days) 113 tablet 5   No current facility-administered medications for this visit.    Allergies Allergies  Allergen Reactions   Latex Rash   Penicillins Other (See Comments)    Yeast infection / Childhood   Sulfa Antibiotics Rash   Tape Other (See Comments)    Plastic, silicone, and paper tape causes bruising and pulls off skin. Cloth tape works fine    Histories Past Medical History:  Diagnosis Date   Acid reflux     Anemia of chronic disease    Arthritis    Asthma    Atrial fibrillation (HCC)    Bilateral carotid bruits    Complication of anesthesia    "hard to wake up, I have sleep apnea" no CPAP   Depression    Diverticulitis    Duodenal ulcer    Dysrhythmia    Afib   ESRD (end stage renal disease) ([facility])    MWF [place]   Headache    History of blood transfusion    Hypertension    Malaise and fatigue    Orthostatic hypotension    Shortness of breath    " when I walk to fast"   Sleep apnea    Syncope    Tubulovillous adenoma of colon    Past Surgical History:  Procedure Laterality Date   A/V FISTULAGRAM Right 10/18/2020   Procedure: A/V FISTULAGRAM;  Surgeon: Marty Heck, MD;  Location: [facility];  Service: Cardiovascular;  Laterality: Right;   AV FISTULA PLACEMENT  BACK SURGERY     Lumbar fusion L 4 and L 5   BIOPSY  01/09/2020   Procedure: BIOPSY;  Surgeon: Rush Landmark Telford Nab., MD;  Location: [facility];  Service: Gastroenterology;;   BIOPSY  01/31/2021   Procedure: BIOPSY;  Surgeon: Irving Copas., MD;  Location: [facility];  Service: Gastroenterology;;   COLONOSCOPY     COLONOSCOPY N/A 02/01/2021   Procedure: COLONOSCOPY;  Surgeon: Yetta Flock, MD;  Location: [facility];  Service: Gastroenterology;  Laterality: N/A;   ENDOSCOPIC MUCOSAL RESECTION N/A 01/09/2020   Procedure: ENDOSCOPIC MUCOSAL RESECTION;  Surgeon: Rush Landmark Telford Nab., MD;  Location: [facility];  Service: Gastroenterology;  Laterality: N/A;   ENDOSCOPIC MUCOSAL RESECTION N/A 01/31/2021   Procedure: ENDOSCOPIC MUCOSAL RESECTION;  Surgeon: Rush Landmark Telford Nab., MD;  Location: [facility];  Service: Gastroenterology;  Laterality: N/A;   ESOPHAGOGASTRODUODENOSCOPY     ESOPHAGOGASTRODUODENOSCOPY (EGD) WITH PROPOFOL N/A 01/09/2020   Procedure: ESOPHAGOGASTRODUODENOSCOPY (EGD) WITH PROPOFOL;  Surgeon: Rush Landmark Telford Nab., MD;  Location: [facility];   Service: Gastroenterology;  Laterality: N/A;   ESOPHAGOGASTRODUODENOSCOPY (EGD) WITH PROPOFOL N/A 01/13/2020   Procedure: ESOPHAGOGASTRODUODENOSCOPY (EGD) WITH PROPOFOL;  Surgeon: Irene Shipper, MD;  Location: [facility];  Service: Gastroenterology;  Laterality: N/A;   ESOPHAGOGASTRODUODENOSCOPY (EGD) WITH PROPOFOL N/A 01/31/2021   Procedure: ESOPHAGOGASTRODUODENOSCOPY (EGD) WITH PROPOFOL;  Surgeon: Rush Landmark Telford Nab., MD;  Location: [facility];  Service: Gastroenterology;  Laterality: N/A;   EUS N/A 01/09/2020   Procedure: UPPER ENDOSCOPIC ULTRASOUND (EUS) RADIAL;  Surgeon: Irving Copas., MD;  Location: [facility];  Service: Gastroenterology;  Laterality: N/A;   HEMOSTASIS CLIP PLACEMENT  01/09/2020   Procedure: HEMOSTASIS CLIP PLACEMENT;  Surgeon: Irving Copas., MD;  Location: [facility];  Service: Gastroenterology;;   HEMOSTASIS CLIP PLACEMENT  01/31/2021   Procedure: HEMOSTASIS CLIP PLACEMENT;  Surgeon: Irving Copas., MD;  Location: [facility];  Service: Gastroenterology;;   HEMOSTASIS CONTROL  01/13/2020   Procedure: HEMOSTASIS CONTROL;  Surgeon: Irene Shipper, MD;  Location: [facility];  Service: Gastroenterology;;  hemaspray   HOT HEMOSTASIS N/A 01/13/2020   Procedure: HOT HEMOSTASIS (ARGON PLASMA COAGULATION/BICAP);  Surgeon: Irene Shipper, MD;  Location: [facility];  Service: Gastroenterology;  Laterality: N/A;   HOT HEMOSTASIS N/A 02/01/2021   Procedure: HOT HEMOSTASIS (ARGON PLASMA COAGULATION/BICAP);  Surgeon: Yetta Flock, MD;  Location: [facility] ENDOSCOPY;  Service: Gastroenterology;  Laterality: N/A;   IR GENERIC HISTORICAL  07/09/2016   IR US GUIDE VASC ACCESS RIGHT 07/09/2016 Arne Cleveland, MD [facility]   IR GENERIC HISTORICAL  07/09/2016   IR FLUORO GUIDE CV LINE RIGHT 07/09/2016 Arne Cleveland, MD [facility]   KNEE ARTHROPLASTY Left    LAPAROSCOPIC SIGMOID COLECTOMY N/A 07/11/2016   Procedure: LAPAROSCOPIC SIGMOID COLECTOMY;   Surgeon: Clovis Riley, MD;  Location: [facility];  Service: General;  Laterality: N/A;   MASS EXCISION Right 04/10/2020   Procedure: EXCISION SKIN NODULE RIGHT FOREARM;  Surgeon: Waynetta Sandy, MD;  Location: [facility];  Service: Vascular;  Laterality: Right;   SCLEROTHERAPY  01/09/2020   Procedure: Clide Deutscher;  Surgeon: Mansouraty, Telford Nab., MD;  Location: [facility];  Service: Gastroenterology;;   Clide Deutscher  01/13/2020   Procedure: Clide Deutscher;  Surgeon: Irene Shipper, MD;  Location: [facility];  Service: Gastroenterology;;   Maryagnes Amos INJECTION  01/09/2020   Procedure: SUBMUCOSAL LIFTING INJECTION;  Surgeon: Irving Copas., MD;  Location: [facility];  Service: Gastroenterology;;   TUBAL LIGATION     Social History  Socioeconomic History   Marital status: Divorced    Spouse name: Not on file   Number of children: 4   Years of education: 14   Highest education level: Associate degree: occupational, Hotel manager, or vocational program  Occupational History   Occupation: Retired  Tobacco Use   Smoking status: Never   Smokeless tobacco: Never  Vaping Use   Vaping Use: Never used  Substance and Sexual Activity   Alcohol use: No   Drug use: No   Sexual activity: Never  Other Topics Concern   Not on file  Social History Narrative   HH 1   Divorced   Outpatient dialysis Mon, Wed, Fri   4 children: 1 daughter locally is an Designer, multimedia and 3 sons in [place] Determinants of Health   Financial Resource Strain: Low Risk    Difficulty of Paying Living Expenses: Not hard at all  Food Insecurity: No Food Insecurity   Worried About Charity fundraiser in the Last Year: Never true   Arboriculturist in the Last Year: Never true  Transportation Needs: No Transportation Needs   Lack of Transportation (Medical): No   Lack of Transportation (Non-Medical): No  Physical Activity: Inactive   Days of Exercise per Week: 0 days   Minutes of  Exercise per Session: 0 min  Stress: Stress Concern Present   Feeling of Stress : To some extent  Social Connections: Moderately Isolated   Frequency of Communication with Friends and Family: More than three times a week   Frequency of Social Gatherings with Friends and Family: Never   Attends Religious Services: More than 4 times per year   Active Member of [employer] or Organizations: No   Attends Music therapist: Never   Marital Status: Divorced  Human resources officer Violence: Not At Risk   Fear of Current or Ex-Partner: No   Emotionally Abused: No   Physically Abused: No   Sexually Abused: No   Family History  Problem Relation Age of Onset   Heart failure Mother    Stroke Mother    Other Father    Colon cancer Neg Hx    Liver disease Neg Hx    Esophageal cancer Neg Hx    Stomach cancer Neg Hx    Inflammatory bowel disease Neg Hx    Rectal cancer Neg Hx    Pancreatic cancer Neg Hx    I have reviewed her medical, social, and family history in detail and updated the electronic medical record as necessary.    PHYSICAL EXAMINATION  BP 122/60   Pulse 66   Ht 5\' 5"  (1.651 m)   Wt 181 lb 6.4 oz (82.3 kg)   SpO2 96%   BMI 30.19 kg/m  Wt Readings from Last 3 Encounters:  05/08/21 181 lb 6.4 oz (82.3 kg)  05/07/21 180 lb (81.6 kg)  02/28/21 203 lb 12.8 oz (92.4 kg)  GEN: NAD, appears younger than stated age, doesn't appear chronically ill but certainly looks thinner than when I last saw her in July PSYCH: Cooperative, without pressured speech EYE: Conjunctivae pink, sclerae anicteric ENT: MMM, no erythema or oropharyngeal lesions visible CV: Nontachycardic RESP: No audible wheezing GI: NABS, soft, protuberant abdomen, nontender, without rebound or guarding NT/ND, without rebound or guarding MSK/EXT: Bilateral lower extremity edema present SKIN: No jaundice, EURO:  Alert & Oriented x 3, no focal deficits   REVIEW OF DATA  I reviewed the following data at the time  of this encounter:  GI Procedures and Studies  July 2022 upper endoscopy - No gross lesions in esophagus. Z-line regular, 37 cm from the incisors. - 1 cm hiatal hernia. - Four gastric polyps - inflammatory in appearance. - Darkened texture changed mucosa in the antrum. - No other gross lesions in the stomach. - Duodenal scar in D2 from previous TVA resection. Some polypoid tissue present - query recurrence vs clip artifact. Sampled off via snare. Clips (MR conditional) were placed to close the region and decrease risk of bleeding. - Major papilla is prominent. Biopsied to rule out adenomatous change. - No other visualized gross lesions in the entire examined duodenum. DRE - I performed a DRE while under sedation. Patient has some internal/external hemorrhoids. However, her stool is maroon. Reviewed previous colonoscopy but no overt diverticulosis noted and last colonoscopy within 1-year. I am concerned whether this was bleeding due to her being supratherapeutic last week but would expect with INR being normalized at this time we shouldn't see the stool changes at this time.  July 2022 colonoscopy - Preparation of the colon was poor. - Hemorrhoids found on perianal exam. - Stool and maroon blood in the entire examined colon. - A single bleeding punctate vessel in the splenic flexure. Treated with argon plasma coagulation (APC). - Internal hemorrhoids. Overall, poor visualization, lucky to see this vessel in the splenic flexure. I did not appreciate any mucosal trauma to cause this during insertion, although possible? I would plan on another bowel prep tonight, and if continued bleeding then would repeat colonoscopy tomorrow.  Laboratory Studies  Reviewed those in epic  Imaging Studies  March 2021 CT abdomen pelvis with contrast IMPRESSION: 1. No acute CT findings to explain left lower quadrant abdominal pain. 2. Evidence of prior sigmoid colon resection and reanastomosis.  No significant diverticular disease of the remaining colon. 3. Acquired cystic kidney disease, in keeping with renal dialysis. 4. Coronary artery disease. Aortic Atherosclerosis (ICD10-I70.0).   ASSESSMENT  Ms. Siebel is a 75 y.o. 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  The patient is seen today for evaluation and management of:  1. Dysphagia, unspecified type   2. Unintentional weight loss   3. Gastroesophageal reflux disease without esophagitis   4. Tubulovillous adenoma of small intestine    The patient is hemodynamically stable.  Clinically however, she is experiencing episodes of dysphagia with unintentional weight loss.  She has had recent upper endoscopy performed by myself which has not shown any evidence of recurrence of the TVA of the duodenum.  Recent colonoscopy was very poor in preparation but no overt mass or lesion was noted in the bleeding stopped after treatment.  With the patient's symptoms it is entirely possible that she is experiencing issues of dysmotility but with the significant weight loss she has experienced in the last 3 months endoscopic evaluation and dilation is not unreasonable and we will move forward with getting that scheduled with her primary gastroenterologist, Dr. Hilarie Fredrickson.  If nothing is found to dilate (which I expect is the case since I did not notice any issues back in July) then manometric studies and barium swallow will be recommended most likely by her primary GI.  Pending how the patient is doing with the work-up she may require a CT chest as she has already had a recent CT angio of  the abdomen earlier this year but again will defer this until after her endoscopic evaluation and potential dilation.  Follow-up upper endoscopy in 2 years for surveillance is  not unreasonable based on what the patient's health is.  We will need to get the patient off of Coumadin prior to her procedure with Dr. Hilarie Fredrickson.  I will forward this note to him as well. The risks and benefits of endoscopic evaluation were discussed with the patient; these include but are not limited to the risk of perforation, infection, bleeding, missed lesions, lack of diagnosis, severe illness requiring hospitalization, as well as anesthesia and sedation related illnesses.  The patient and/or family is agreeable to proceed.  All patient questions were answered to the best of my ability, and the patient agrees to the aforementioned plan of action with follow-up as indicated.   PLAN  Obtain approval for Coumadin hold for 5 days prior to procedure Proceed with scheduling EGD with dilation with Dr. Hilarie Fredrickson for primary GI (placing patient in [place] since she has done well previously but will forward this to Dr. Hilarie Fredrickson as well to make final decision since she is on HD) If dilation does not lead to mucosal wrents and improvement in symptoms then barium swallow and esophageal manometry will likely be considered True surveillance for prior duodenal TVA in 2024   Orders Placed This Encounter  Procedures   Ambulatory referral to Gastroenterology    New Prescriptions   No medications on file   Modified Medications   No medications on file    Planned Follow Up No follow-ups on file.   Total Time in Face-to-Face and in Coordination of Care for patient including independent/personal interpretation/review of prior testing, medical history, examination, medication adjustment, communicating results with the patient directly, and documentation with the EHR is 25 minutes.   Justice Britain, MD [facility] Office # [phone]

## 2021-05-09 NOTE — Telephone Encounter (Signed)
Done

## 2021-05-09 NOTE — Telephone Encounter (Signed)
Hhold Coumading for 5 days. Restart same day after procedure and ask her to come in for INR check to [place] in 1 week.  Same home dose. If issues let us know. Low risk procedure.

## 2021-05-09 NOTE — Telephone Encounter (Signed)
Patient aware of Dr. Letta Pate reply and understood.

## 2021-05-10 DIAGNOSIS — Z23 Encounter for immunization: Secondary | ICD-10-CM | POA: Diagnosis not present

## 2021-05-10 DIAGNOSIS — N2581 Secondary hyperparathyroidism of renal origin: Secondary | ICD-10-CM | POA: Diagnosis not present

## 2021-05-10 DIAGNOSIS — N186 End stage renal disease: Secondary | ICD-10-CM | POA: Diagnosis not present

## 2021-05-10 DIAGNOSIS — Z992 Dependence on renal dialysis: Secondary | ICD-10-CM | POA: Diagnosis not present

## 2021-05-10 DIAGNOSIS — D631 Anemia in chronic kidney disease: Secondary | ICD-10-CM | POA: Diagnosis not present

## 2021-05-10 NOTE — Telephone Encounter (Signed)
Left detailed message for pt. I did ask for return call to confirm that pt did get message and understands instructions.

## 2021-05-12 ENCOUNTER — Ambulatory Visit (HOSPITAL_COMMUNITY)
Admission: RE | Admit: 2021-05-12 | Discharge: 2021-05-12 | Disposition: A | Discharge status: Home / Self Care | Payer: Medicare Other | Source: Ambulatory Visit | Attending: Physical Medicine & Rehabilitation | Admitting: Physical Medicine & Rehabilitation

## 2021-05-12 DIAGNOSIS — M5440 Lumbago with sciatica, unspecified side: Secondary | ICD-10-CM | POA: Insufficient documentation

## 2021-05-12 DIAGNOSIS — G8929 Other chronic pain: Secondary | ICD-10-CM | POA: Diagnosis not present

## 2021-05-12 DIAGNOSIS — M5126 Other intervertebral disc displacement, lumbar region: Secondary | ICD-10-CM | POA: Diagnosis not present

## 2021-05-12 DIAGNOSIS — M48061 Spinal stenosis, lumbar region without neurogenic claudication: Secondary | ICD-10-CM | POA: Diagnosis not present

## 2021-05-13 DIAGNOSIS — N186 End stage renal disease: Secondary | ICD-10-CM | POA: Diagnosis not present

## 2021-05-13 DIAGNOSIS — Z992 Dependence on renal dialysis: Secondary | ICD-10-CM | POA: Diagnosis not present

## 2021-05-13 DIAGNOSIS — N2581 Secondary hyperparathyroidism of renal origin: Secondary | ICD-10-CM | POA: Diagnosis not present

## 2021-05-13 DIAGNOSIS — D631 Anemia in chronic kidney disease: Secondary | ICD-10-CM | POA: Diagnosis not present

## 2021-05-13 DIAGNOSIS — I700 Atherosclerosis of aorta: Secondary | ICD-10-CM | POA: Insufficient documentation

## 2021-05-13 DIAGNOSIS — Z23 Encounter for immunization: Secondary | ICD-10-CM | POA: Diagnosis not present

## 2021-05-13 NOTE — Progress Notes (Signed)
Jane [name] is a 75 y.o.female with hx of HTN,ESRD on dialysis, paroxysmal atrial fib,and generalized OA here today to follow on some chronic medical conditions.  Last follow up visit: 01/02/21. Since her last visit she has been in the ED due to lower back pain with radiculopathy. She had an  back injection a couple weeks ago, did not help. Buttocks and groin sharp pain. Duloxetine 40 mg has not helped at all. She has tolerated medication well.  She has had lumbar MRI done. 1. Degenerative foraminal impingement on the left at L3-4 and right at L5-S1. 2. Left subarticular recess stenosis at L3-4.  She has had a few falls since her last visit because of sudden pain of lower back and LE's. Last fall a month ago. No head trauma or LOC. She was able to get up without help. She has a walker ,does not have it with her today.  Hypertension:  Medications:Amlodipine 10 mg daily, Hydralazine 25 mg tid, diltiazem 60 mg tid, carvedilol 25 mg 1.5 mg bid.  BP readings at home:110's-130's/50-60. She has not taking her meds today. Side effects:None Negative for unusual or severe headache, visual changes, exertional chest pain, dyspnea,  focal weakness, or edema. She takes Diltiazem 60 mg tid prn, she has not taken it in a while. She  is going to see Dr Einar Gip in 06/2021.  Lab Results  Component Value Date   CREATININE 9.77 (H) 04/29/2021   BUN 36 (H) 04/29/2021   NA 137 04/29/2021   K 3.3 (L) 04/29/2021   CL 101 04/29/2021   CO2 26 04/29/2021   Aortic atherosclerosis has been seen on imaging, 10/11/20 CTA of abdomen/pelvis. She is not on statin. On chronic anticoagulation, Coumadin.  Lab Results  Component Value Date   CHOL 198 10/06/2019   HDL 53 10/06/2019   LDLCALC 135 (H) 10/06/2019   TRIG 51 10/06/2019   CHOLHDL 3.7 10/06/2019   Still having nausea and dysphagia, feels like certain foods get stuck in her upper chest.  States that she has an appt with GI for EGD and possible  esophageal dilation. Decreased oral intake, not much appetite, she has lost wt. + Heartburn. Negative for fever,chills,sore throat,or dysphonia.  Still feeling fatigue. Problem has been going on for a while, she thinks it is caused by some of her medications. She is planning on addressing this with her cardiologist. OSA, she is not wearing her CPAP. Appt with pulmonologist has been arranged for sleep study.  Review of Systems  Constitutional:  Positive for activity change and appetite change.  Respiratory:  Negative for cough and wheezing.   Gastrointestinal:  Negative for abdominal pain and vomiting.       No changes in bowel habits.  Endocrine: Negative for cold intolerance and heat intolerance.  Genitourinary:  Negative for dysuria and hematuria.  Musculoskeletal:  Positive for arthralgias, back pain and gait problem.  Skin:  Negative for rash.  Neurological:  Negative for syncope and facial asymmetry.  Psychiatric/Behavioral:  Negative for confusion. The patient is nervous/anxious.   Rest see pertinent positives and negatives per HPI.  Current Outpatient Medications on File Prior to Visit  Medication Sig Dispense Refill   albuterol (PROVENTIL HFA;VENTOLIN HFA) 108 (90 Base) MCG/ACT inhaler Inhale 1 puff into the lungs every 6 (six) hours as needed for wheezing or shortness of breath. 6.7 g 2   amiodarone (PACERONE) 200 MG tablet Take 1 tablet (200 mg total) by mouth daily. 90 tablet 1  amLODipine (NORVASC) 10 MG tablet Take 1 tablet (10 mg total) by mouth daily. (Patient not taking: Reported on 05/16/2021) 90 tablet 3   B Complex-C-Zn-Folic Acid (DIALYVITE 440 WITH ZINC) 0.8 MG TABS Take 1 tablet by mouth daily.     CALCITRIOL PO Take by mouth 3 (three) times a week.     carvedilol (COREG) 25 MG tablet Take 1.5 tablets (37.5 mg total) by mouth 2 (two) times daily with a meal. (Patient taking differently: Take 25 mg by mouth 2 (two) times daily with a meal.) 240 tablet 3   diltiazem  (CARDIZEM) 60 MG tablet Take 1 tablet (60 mg total) by mouth 3 (three) times daily as needed (Atrial fibrillation). 30 tablet 2   DULoxetine (CYMBALTA) 20 MG capsule Take 2 capsules (40 mg total) by mouth daily. (Patient taking differently: Take 20 mg by mouth daily.) 180 capsule 2   ethyl chloride spray Apply 1 application topically every Monday, Wednesday, and Friday with hemodialysis.  12   hydrALAZINE (APRESOLINE) 25 MG tablet Take 1 tablet (25 mg total) by mouth 3 (three) times daily. (Patient taking differently: Take 25 mg by mouth in the morning and at bedtime.) 270 tablet 3   isosorbide dinitrate (ISORDIL) 30 MG tablet Take 1 tablet (30 mg total) by mouth 3 (three) times daily. (Patient taking differently: Take 30 mg by mouth 2 (two) times daily.) 270 tablet 3   LINZESS 145 MCG CAPS capsule TAKE 1 CAPSULE BY MOUTH ONCE DAILY BEFORE BREAKFAST (Patient taking differently: Take 145 mcg by mouth daily before breakfast.) 30 capsule 0   omeprazole (PRILOSEC) 40 MG capsule TAKE 1 CAPSULE BY MOUTH TWICE DAILY BEFORE A MEAL (Patient taking differently: Take 40 mg by mouth in the morning and at bedtime. Take before a meal) 60 capsule 0   warfarin (COUMADIN) 5 MG tablet USE AS DIRECTED BY COUMADIN CLINIC (Patient taking differently: Take 2.5-5 mg by mouth See admin instructions. Currently on: 5 mg every Mon, Wed; 2.5 mg all other days) 113 tablet 5   No current facility-administered medications on file prior to visit.   Past Medical History:  Diagnosis Date   Acid reflux    Anemia of chronic disease    Arthritis    Asthma    Atrial fibrillation ([place])    Bilateral carotid bruits    Complication of anesthesia    "hard to wake up, I have sleep apnea" no CPAP   Depression    Diverticulitis    Duodenal ulcer    Dysrhythmia    Afib   ESRD (end stage renal disease) ([place])    MWF [place]   Headache    History of blood transfusion    Hypertension    Malaise and fatigue    Orthostatic  hypotension    Shortness of breath    " when I walk to fast"   Sleep apnea    Syncope    Tubulovillous adenoma of colon    Allergies  Allergen Reactions   Latex Rash   Penicillins Other (See Comments)    Yeast infection / Childhood   Sulfa Antibiotics Rash   Tape Other (See Comments)    Plastic, silicone, and paper tape causes bruising and pulls off skin. Cloth tape works fine    Social History   Socioeconomic History   Marital status: Divorced    Spouse name: Not on file   Number of children: 4   Years of education: 14   Highest education level: Futures trader  degree: occupational, Hotel manager, or vocational program  Occupational History   Occupation: Retired  Tobacco Use   Smoking status: Never   Smokeless tobacco: Never  Vaping Use   Vaping Use: Never used  Substance and Sexual Activity   Alcohol use: No   Drug use: No   Sexual activity: Never  Other Topics Concern   Not on file  Social History Narrative   HH 1   Divorced   Outpatient dialysis Mon, Wed, Fri   4 children: 1 daughter locally is an Designer, multimedia and 3 sons in [place] Determinants of Health   Financial Resource Strain: Low Risk    Difficulty of Paying Living Expenses: Not hard at all  Food Insecurity: No Food Insecurity   Worried About Charity fundraiser in the Last Year: Never true   Arboriculturist in the Last Year: Never true  Transportation Needs: No Transportation Needs   Lack of Transportation (Medical): No   Lack of Transportation (Non-Medical): No  Physical Activity: Inactive   Days of Exercise per Week: 0 days   Minutes of Exercise per Session: 0 min  Stress: Stress Concern Present   Feeling of Stress : To some extent  Social Connections: Moderately Isolated   Frequency of Communication with Friends and Family: More than three times a week   Frequency of Social Gatherings with Friends and Family: Never   Attends Religious Services: More than 4 times per year   Active Member of  Clubs or Organizations: No   Attends Archivist Meetings: Never   Marital Status: Divorced   Vitals:   05/14/21 0931  BP: 118/70  Pulse: 60  Resp: 16   Body mass index is 30 kg/m.  Physical Exam Vitals and nursing note reviewed.  Constitutional:      General: She is not in acute distress.    Appearance: She is well-developed.  HENT:     Head: Normocephalic and atraumatic.     Mouth/Throat:     Mouth: Mucous membranes are moist.  Eyes:     Conjunctiva/sclera: Conjunctivae normal.  Cardiovascular:     Rate and Rhythm: Normal rate and regular rhythm.     Pulses:          Dorsalis pedis pulses are 2+ on the right side and 2+ on the left side.     Heart sounds: Murmur (SEM II/VI RUSB) heard.  Pulmonary:     Effort: Pulmonary effort is normal. No respiratory distress.     Breath sounds: Normal breath sounds.  Abdominal:     Palpations: Abdomen is soft.     Tenderness: There is no abdominal tenderness.  Musculoskeletal:     Lumbar back: Tenderness (lumbar paraspinal muscles,bilateral.) present. No bony tenderness.  Skin:    General: Skin is warm.     Findings: No erythema or rash.  Neurological:     General: No focal deficit present.     Mental Status: She is alert and oriented to person, place, and time.     Cranial Nerves: No cranial nerve deficit.     Gait: Gait normal.     Comments: Unstable,wide base gait, not assisted.  Psychiatric:        Mood and Affect: Mood is anxious.     Comments: Well groomed, good eye contact.   ASSESSMENT AND PLAN:  JaneEvonna was seen today for follow-up.  Diagnoses and all orders for this visit:  Falls frequently She is not interested  din PT for now. Fall precautions discussed. Recommend using her walker all the time.  Dysphagia, unspecified type Small bites at the time,avoid talking while eating. Continue current PPI. EGD in 01/2021 showed no gross lesions in esophagus,1 cm hiatal hernia,4 gastric polyps with  inflammatory in appearance. Duodenal scar. Last evaluated by GI on 05/08/21.  Gastroesophageal reflux disease without esophagitis Currently on Omeprazole 40 mg bid, no changes. GERD precautions recommended. Continue following with GI.  Hypertension with renal disease BP has improved, she has had some SBP's in the low 110's. No changes in Carvedilol,Imdur,Hydralazine,or Amlodipine dose. Continue monitoring BP, if needed we could decrease Carvedilol. Continue low salt diet.   Generalized osteoarthritis of multiple sites Duloxetine 40 mg has not helped, so we are going to wean medication off. Recommend decreasing Duloxetine from 40 mg to 20 mg daily x 2 wks then q 2 days x 10 d and stop.If she feels like arthralgias get worse, she can resume medication.  Atherosclerosis of aorta ([place]) We discussed Dx and treatment options. She is not interested in starting statins at this time, we discussed some CV benefits. She is on chronic anticoagulation,Coumadin.  Chronic fatigue We discussed possible causes. Some of her chronic medical problems and medications can be contributing factors. Keep appt with pulmonologist, 05/22/21, to discuss sleep study.  I spent a total of 41 minutes in both face to face and non face to face activities for this visit on the date of this encounter. During this time history was obtained and documented, examination was performed, prior labs/imaging reviewed, and assessment/plan discussed.  Return in about 6 months (around 11/12/2021).   Denilson Salminen G. [name], MD  [facility]. [place] office.

## 2021-05-14 ENCOUNTER — Telehealth: Payer: Self-pay | Admitting: *Deleted

## 2021-05-14 ENCOUNTER — Encounter: Payer: Self-pay | Admitting: Family Medicine

## 2021-05-14 ENCOUNTER — Ambulatory Visit (INDEPENDENT_AMBULATORY_CARE_PROVIDER_SITE_OTHER): Payer: Medicare Other | Admitting: Family Medicine

## 2021-05-14 ENCOUNTER — Other Ambulatory Visit: Payer: Self-pay

## 2021-05-14 VITALS — BP 118/70 | HR 60 | Resp 16 | Ht 65.0 in | Wt 180.2 lb

## 2021-05-14 DIAGNOSIS — K219 Gastro-esophageal reflux disease without esophagitis: Secondary | ICD-10-CM

## 2021-05-14 DIAGNOSIS — I129 Hypertensive chronic kidney disease with stage 1 through stage 4 chronic kidney disease, or unspecified chronic kidney disease: Secondary | ICD-10-CM | POA: Diagnosis not present

## 2021-05-14 DIAGNOSIS — R296 Repeated falls: Secondary | ICD-10-CM

## 2021-05-14 DIAGNOSIS — I6523 Occlusion and stenosis of bilateral carotid arteries: Secondary | ICD-10-CM

## 2021-05-14 DIAGNOSIS — M159 Polyosteoarthritis, unspecified: Secondary | ICD-10-CM | POA: Diagnosis not present

## 2021-05-14 DIAGNOSIS — R1310 Dysphagia, unspecified: Secondary | ICD-10-CM

## 2021-05-14 DIAGNOSIS — R5382 Chronic fatigue, unspecified: Secondary | ICD-10-CM | POA: Diagnosis not present

## 2021-05-14 DIAGNOSIS — I700 Atherosclerosis of aorta: Secondary | ICD-10-CM | POA: Diagnosis not present

## 2021-05-14 NOTE — Patient Instructions (Addendum)
A few things to remember from today's visit:  Hypertension with renal disease  Atherosclerosis of aorta (HCC)  Generalized osteoarthritis of multiple sites  Falls frequently  If you need refills please call your pharmacy. Do not use My Chart to request refills or for acute issues that need immediate attention.   Because duloxetine has not helped, we are going to wean it off. So decreased to 1 cap daily for 2 weeks and then q 2 days for 10 days and stop. Physico therapy may help with fall prevention. Continue monitoring blood pressure at home, if frequent low 100's blood pressure readings we may need to adjust meds, decreasing carvedilol is an option.  Fall precautions. Always use your walker.  We can check cholesterol next visit. Consider taking cholesterol medication, Pravastatin or Crestor are some options.  Please be sure medication list is accurate. If a new problem present, please set up appointment sooner than planned today.

## 2021-05-14 NOTE — Assessment & Plan Note (Signed)
We discussed Dx and treatment options. She is not interested in starting statins at this time, we discussed some CV benefits. She is on chronic anticoagulation,Coumadin.

## 2021-05-14 NOTE — Assessment & Plan Note (Signed)
BP has improved, she has had some SBP's in the low 110's. No changes in Carvedilol,Imdur,Hydralazine,or Amlodipine dose. Continue monitoring BP, if needed we could decrease Carvedilol. Continue low salt diet.

## 2021-05-14 NOTE — Telephone Encounter (Addendum)
Left detailed message on mobile number VM per DPR. ----- Message from Charlett Blake, MD sent at 05/14/2021 11:30 AM EDT ----- Please call patient and let her know that there is no evidence of fracture.  There is a disc problem at L3-L4 that is likely causing her pain.  It does not look like it needs surgery referral at this time.

## 2021-05-14 NOTE — Assessment & Plan Note (Signed)
Duloxetine 40 mg has not helped, so we are going to wean medication off. Recommend decreasing Duloxetine from 40 mg to 20 mg daily x 2 wks then q 2 days x 10 d and stop.If she feels like arthralgias get worse, she can resume medication.

## 2021-05-15 ENCOUNTER — Ambulatory Visit (HOSPITAL_COMMUNITY): Payer: Medicare Other

## 2021-05-15 DIAGNOSIS — N186 End stage renal disease: Secondary | ICD-10-CM | POA: Diagnosis not present

## 2021-05-15 DIAGNOSIS — Z23 Encounter for immunization: Secondary | ICD-10-CM | POA: Diagnosis not present

## 2021-05-15 DIAGNOSIS — Z992 Dependence on renal dialysis: Secondary | ICD-10-CM | POA: Diagnosis not present

## 2021-05-15 DIAGNOSIS — N2581 Secondary hyperparathyroidism of renal origin: Secondary | ICD-10-CM | POA: Diagnosis not present

## 2021-05-15 DIAGNOSIS — D631 Anemia in chronic kidney disease: Secondary | ICD-10-CM | POA: Diagnosis not present

## 2021-05-15 NOTE — Telephone Encounter (Signed)
Patient informed. Voiced understanding.

## 2021-05-16 ENCOUNTER — Other Ambulatory Visit: Payer: Self-pay

## 2021-05-16 ENCOUNTER — Ambulatory Visit: Payer: Medicare Other | Admitting: Pharmacist

## 2021-05-16 DIAGNOSIS — Z5181 Encounter for therapeutic drug level monitoring: Secondary | ICD-10-CM | POA: Diagnosis not present

## 2021-05-16 DIAGNOSIS — Z7901 Long term (current) use of anticoagulants: Secondary | ICD-10-CM | POA: Diagnosis not present

## 2021-05-16 DIAGNOSIS — I480 Paroxysmal atrial fibrillation: Secondary | ICD-10-CM | POA: Diagnosis not present

## 2021-05-16 LAB — POCT INR: INR: 1.6 — AB (ref 2.0–3.0)

## 2021-05-16 NOTE — Progress Notes (Signed)
Anticoagulation Management Jane [name] is a 75 y.o. female who reports to the clinic for monitoring of warfarin treatment.    Indication: atrial fibrillation CHA2DS2 Vasc Score 4 (Age >27, female, HTN hx), HAS-BLED 2 (Age>65, renal disease)  Duration: indefinite Supervising physician: Adrian Prows  Anticoagulation Clinic Visit History:  Patient does report signs/symptoms of bleeding or thromboembolism.  Reports no changes in diet, medications, or lifestyle.   Pt had a left sacroiliac injection of lidocaine and cortisone on 05/07/21. Pt reports that have pain symptoms have improved slightly since. Still feeling weak and numb. Denies any further falls or injuries. Stopped tramadol, Norco and flexeril. Titrating down off duloxetine. Currently taking 20 mg daily. Pt reports that she has also decreased her carvedilol dose from 37.5 mg BID to 25 mg BID. When reviewing med list together, pt notes that she wasn't sure if she has been taking her amiodarone 200 mg Qday lately. Called to confirm with pt's pharmacy. Last filled on 04/10/21 for 90 days supply. Pt willing to restart on amiodarone 200 mg qday for now and review it with Dr. Einar Gip at upcoming [facility] on 07/18/21  Pt had f/u GI OV for concerns of dysphia. Pt scheduled to undergo EGD on 05/31/21. Pt to hold warfarin 5 days prior.   Amlodipine stopped per nephrology due to increased leg swelling. Current antihypertensive consists of hydralazine 25 mg BID, isordil 30 mg BID, carvedilol 25 mg BID.  Anticoagulation Episode Summary     Current INR goal:  2.0-3.0  TTR:  66.0 % (2.6 y)  Next INR check:  06/06/2021  INR from last check:  1.6 (05/16/2021)  Weekly max warfarin dose:    Target end date:  Indefinite  INR check location:    Preferred lab:    Send INR reminders to:     Indications   Paroxysmal atrial fibrillation (HCC) [I48.0] Monitoring for long-term anticoagulant use [Z51.81 Z79.01]        Comments:           Allergies   Allergen Reactions   Latex Rash   Penicillins Other (See Comments)    Yeast infection / Childhood   Sulfa Antibiotics Rash   Tape Other (See Comments)    Plastic, silicone, and paper tape causes bruising and pulls off skin. Cloth tape works fine    Current Outpatient Medications:    albuterol (PROVENTIL HFA;VENTOLIN HFA) 108 (90 Base) MCG/ACT inhaler, Inhale 1 puff into the lungs every 6 (six) hours as needed for wheezing or shortness of breath., Disp: 6.7 g, Rfl: 2   B Complex-C-Zn-Folic Acid (DIALYVITE 315 WITH ZINC) 0.8 MG TABS, Take 1 tablet by mouth daily., Disp: , Rfl:    CALCITRIOL PO, Take by mouth 3 (three) times a week., Disp: , Rfl:    carvedilol (COREG) 25 MG tablet, Take 1.5 tablets (37.5 mg total) by mouth 2 (two) times daily with a meal. (Patient taking differently: Take 25 mg by mouth 2 (two) times daily with a meal.), Disp: 240 tablet, Rfl: 3   diltiazem (CARDIZEM) 60 MG tablet, Take 1 tablet (60 mg total) by mouth 3 (three) times daily as needed (Atrial fibrillation)., Disp: 30 tablet, Rfl: 2   DULoxetine (CYMBALTA) 20 MG capsule, Take 2 capsules (40 mg total) by mouth daily. (Patient taking differently: Take 20 mg by mouth daily.), Disp: 180 capsule, Rfl: 2   ethyl chloride spray, Apply 1 application topically every Monday, Wednesday, and Friday with hemodialysis., Disp: , Rfl: 12   hydrALAZINE (APRESOLINE)  25 MG tablet, Take 1 tablet (25 mg total) by mouth 3 (three) times daily. (Patient taking differently: Take 25 mg by mouth in the morning and at bedtime.), Disp: 270 tablet, Rfl: 3   isosorbide dinitrate (ISORDIL) 30 MG tablet, Take 1 tablet (30 mg total) by mouth 3 (three) times daily. (Patient taking differently: Take 30 mg by mouth 2 (two) times daily.), Disp: 270 tablet, Rfl: 3   LINZESS 145 MCG CAPS capsule, TAKE 1 CAPSULE BY MOUTH ONCE DAILY BEFORE BREAKFAST (Patient taking differently: Take 145 mcg by mouth daily before breakfast.), Disp: 30 capsule, Rfl: 0    omeprazole (PRILOSEC) 40 MG capsule, TAKE 1 CAPSULE BY MOUTH TWICE DAILY BEFORE A MEAL (Patient taking differently: Take 40 mg by mouth in the morning and at bedtime. Take before a meal), Disp: 60 capsule, Rfl: 0   warfarin (COUMADIN) 5 MG tablet, USE AS DIRECTED BY COUMADIN CLINIC (Patient taking differently: Take 2.5-5 mg by mouth See admin instructions. Currently on: 5 mg every Mon, Wed; 2.5 mg all other days), Disp: 113 tablet, Rfl: 5   amiodarone (PACERONE) 200 MG tablet, Take 1 tablet (200 mg total) by mouth daily., Disp: 90 tablet, Rfl: 1   amLODipine (NORVASC) 10 MG tablet, Take 1 tablet (10 mg total) by mouth daily. (Patient not taking: Reported on 05/16/2021), Disp: 90 tablet, Rfl: 3 Past Medical History:  Diagnosis Date   Acid reflux    Anemia of chronic disease    Arthritis    Asthma    Atrial fibrillation (HCC)    Bilateral carotid bruits    Complication of anesthesia    "hard to wake up, I have sleep apnea" no CPAP   Depression    Diverticulitis    Duodenal ulcer    Dysrhythmia    Afib   ESRD (end stage renal disease) ([place])    MWF [place]   Headache    History of blood transfusion    Hypertension    Malaise and fatigue    Orthostatic hypotension    Shortness of breath    " when I walk to fast"   Sleep apnea    Syncope    Tubulovillous adenoma of colon    ASSESSMENT  Recent Results: The most recent result is correlated with 22.5 mg per week:  Lab Results  Component Value Date   INR 1.6 (A) 05/16/2021   INR 2.4 04/30/2021   INR 2.6 04/02/2021    Anticoagulation Dosing: Description   INR below goal. Take 5 mg today and then continue taking 5 mg every Mon, Wed and 2.5 mg all other days. HOLD 11/6-11/10. Take 5 mg on 11/11. Recheck INR in 3 weeks.       INR today: Subtherapeutic. Previously stable. Pt denies any missed doses. Pt missing her amiodarone dose recently could possibly contributing to the subtherapeutic INR reading today. Previously stable  and controlled on current weekly dose and pt being on her amiodarone dose. Pt unsure of when she stopped, but willing to restart her home amiodraone dose of 200 mg Qday. Pt denies any complains of relevant bleeding or bruising symptoms. Denies any other relevant changes in diet, medications, or lifestyle. Pt verbalized prescribed warfarin dose. Will boost today and continue previously stable warfarin intake. In setting of recent upcoming EGD procedure would not consider weekly dose change.    PLAN Weekly dose was unchanged by 0 % to 22.5 mg/week. Take 5 mg today and then continue taking 5 mg every Mon, Wed  and 2.5 mg all other days. HOLD 11/6-11/10. Take 5 mg on 11/11. Recheck INR in 3 weeks.   Patient Instructions  INR below goal. Take 5 mg today and then continue taking 5 mg every Mon, Wed and 2.5 mg all other days. HOLD 11/6-11/10. Take 5 mg on 11/11. Recheck INR in 3 weeks.  Patient advised to contact clinic or seek medical attention if signs/symptoms of bleeding or thromboembolism occur.  Patient verbalized understanding by repeating back information and was advised to contact me if further medication-related questions arise.   Follow-up Return in about 3 weeks (around 06/06/2021).  [name], PharmD  15 minutes spent face-to-face with the patient during the encounter. 50% of time spent on education, including signs/sx bleeding and clotting, as well as food and drug interactions with warfarin. 50% of time was spent on fingerprick POC INR sample collection,processing, results determination, and documentation

## 2021-05-16 NOTE — Patient Instructions (Signed)
INR below goal. Take 5 mg today and then continue taking 5 mg every Mon, Wed and 2.5 mg all other days. HOLD 11/6-11/10. Take 5 mg on 11/11. Recheck INR in 3 weeks.

## 2021-05-17 DIAGNOSIS — Z23 Encounter for immunization: Secondary | ICD-10-CM | POA: Diagnosis not present

## 2021-05-17 DIAGNOSIS — D631 Anemia in chronic kidney disease: Secondary | ICD-10-CM | POA: Diagnosis not present

## 2021-05-17 DIAGNOSIS — Z992 Dependence on renal dialysis: Secondary | ICD-10-CM | POA: Diagnosis not present

## 2021-05-17 DIAGNOSIS — N186 End stage renal disease: Secondary | ICD-10-CM | POA: Diagnosis not present

## 2021-05-17 DIAGNOSIS — N2581 Secondary hyperparathyroidism of renal origin: Secondary | ICD-10-CM | POA: Diagnosis not present

## 2021-05-20 ENCOUNTER — Encounter: Payer: Self-pay | Admitting: Pharmacist

## 2021-05-20 DIAGNOSIS — Z23 Encounter for immunization: Secondary | ICD-10-CM | POA: Diagnosis not present

## 2021-05-20 DIAGNOSIS — D631 Anemia in chronic kidney disease: Secondary | ICD-10-CM | POA: Diagnosis not present

## 2021-05-20 DIAGNOSIS — Z992 Dependence on renal dialysis: Secondary | ICD-10-CM | POA: Diagnosis not present

## 2021-05-20 DIAGNOSIS — N186 End stage renal disease: Secondary | ICD-10-CM | POA: Diagnosis not present

## 2021-05-20 DIAGNOSIS — N2581 Secondary hyperparathyroidism of renal origin: Secondary | ICD-10-CM | POA: Diagnosis not present

## 2021-05-20 NOTE — Progress Notes (Signed)
CARE PLAN ENTRY  05/20/2021 Name: Jane [name] MRN: [id number] DOB: 20-Oct-1945  Jane [name] is enrolled in Remote Patient Monitoring/Principle Care Monitoring.  Date of Enrollment: 10/06/19 Supervising physician: Adrian Prows Indication: HTN  Remote Readings: Compliant and Avg BP: 136/70, HR:62  Next scheduled OV: 07/18/21  Pharmacist Clinical Goal(s):  Over the next 90 days, patient will demonstrate Improved medication adherence as evidenced by medication fill history Over the next 90 days, patient will demonstrate improved understanding of prescribed medications and rationale for usage as evidenced by patient teach back Over the next 90 days, patient will experience decrease in ED visits. ED visits in last 6 months = 1  Over the next 90 days, patient will not experience hospital admission. Hospital Admissions in last 6 months = 0  Interventions: Provider and Inter-disciplinary care team collaboration (see longitudinal plan of care) Comprehensive medication review performed. Discussed plans with patient for ongoing care management follow up and provided patient with direct contact information for care management team Collaboration with provider re: medication management  Patient Self Care Activities:  Self administers medications as prescribed Attends all scheduled provider appointments Performs ADL's independently Performs IADL's independently  Allergies  Allergen Reactions   Latex Rash   Penicillins Other (See Comments)    Yeast infection / Childhood   Sulfa Antibiotics Rash   Tape Other (See Comments)    Plastic, silicone, and paper tape causes bruising and pulls off skin. Cloth tape works fine   Outpatient Encounter Medications as of 05/20/2021  Medication Sig Note   albuterol (PROVENTIL HFA;VENTOLIN HFA) 108 (90 Base) MCG/ACT inhaler Inhale 1 puff into the lungs every 6 (six) hours as needed for wheezing or shortness of breath. 04/29/2021: Pt stated that she hasn't  used since 2019 and has none at home    amiodarone (PACERONE) 200 MG tablet Take 1 tablet (200 mg total) by mouth daily.    amLODipine (NORVASC) 10 MG tablet Take 1 tablet (10 mg total) by mouth daily. (Patient not taking: Reported on 05/16/2021)    B Complex-C-Zn-Folic Acid (DIALYVITE 213 WITH ZINC) 0.8 MG TABS Take 1 tablet by mouth daily.    CALCITRIOL PO Take by mouth 3 (three) times a week.    carvedilol (COREG) 25 MG tablet Take 1.5 tablets (37.5 mg total) by mouth 2 (two) times daily with a meal. (Patient taking differently: Take 25 mg by mouth 2 (two) times daily with a meal.)    diltiazem (CARDIZEM) 60 MG tablet Take 1 tablet (60 mg total) by mouth 3 (three) times daily as needed (Atrial fibrillation).    DULoxetine (CYMBALTA) 20 MG capsule Take 2 capsules (40 mg total) by mouth daily. (Patient taking differently: Take 20 mg by mouth daily.)    ethyl chloride spray Apply 1 application topically every Monday, Wednesday, and Friday with hemodialysis.    hydrALAZINE (APRESOLINE) 25 MG tablet Take 1 tablet (25 mg total) by mouth 3 (three) times daily. (Patient taking differently: Take 25 mg by mouth in the morning and at bedtime.)    isosorbide dinitrate (ISORDIL) 30 MG tablet Take 1 tablet (30 mg total) by mouth 3 (three) times daily. (Patient taking differently: Take 30 mg by mouth 2 (two) times daily.)    LINZESS 145 MCG CAPS capsule TAKE 1 CAPSULE BY MOUTH ONCE DAILY BEFORE BREAKFAST (Patient taking differently: Take 145 mcg by mouth daily before breakfast.)    omeprazole (PRILOSEC) 40 MG capsule TAKE 1 CAPSULE BY MOUTH TWICE DAILY BEFORE A MEAL (Patient  taking differently: Take 40 mg by mouth in the morning and at bedtime. Take before a meal)    warfarin (COUMADIN) 5 MG tablet USE AS DIRECTED BY COUMADIN CLINIC (Patient taking differently: Take 2.5-5 mg by mouth See admin instructions. Currently on: 5 mg every Mon, Wed; 2.5 mg all other days)    No facility-administered encounter medications  on file as of 05/20/2021.    Hypertension   BP goal is:  <140/90  Office blood pressures are  BP Readings from Last 3 Encounters:  05/14/21 118/70  05/08/21 122/60  05/07/21 (!) 158/66    Patient is currently controlled on the following medications:diltiazem 60 mg PRN, carvedilol 25 mg BID, hydralazine 25 mg BID, isordil 30 mg BID  Patient checks BP at home daily  Patient home BP readings are ranging: 108-171/48-123   We discussed diet and exercise extensively  Plan  Continue current medications and control with diet and exercise   ______________ Visit Information SDOH (Social Determinants of Health) assessments performed: Yes.  Jane [name] was given information about Principle Care Management/Remote Patient Monitoring services today including:  RPM/PCM service includes personalized support from designated clinical staff supervised by her physician, including individualized plan of care and coordination with other care providers 24/7 contact phone numbers for assistance for urgent and routine care needs. Standard insurance, coinsurance, copays and deductibles apply for principle care management only during months in which we provide at least 30 minutes of these services. Most insurances cover these services at 100%, however patients may be responsible for any copay, coinsurance and/or deductible if applicable. This service may help you avoid the need for more expensive face-to-face services. Only one practitioner may furnish and bill the service in a calendar month. The patient may stop PCM/RPM services at any time (effective at the end of the month) by phone call to the office staff.  Patient agreed to services and verbal consent obtained.   [name], Pharm.D. [facility] [phone] Ext: 120

## 2021-05-21 DIAGNOSIS — Z992 Dependence on renal dialysis: Secondary | ICD-10-CM | POA: Diagnosis not present

## 2021-05-21 DIAGNOSIS — I158 Other secondary hypertension: Secondary | ICD-10-CM | POA: Diagnosis not present

## 2021-05-21 DIAGNOSIS — N186 End stage renal disease: Secondary | ICD-10-CM | POA: Diagnosis not present

## 2021-05-22 ENCOUNTER — Ambulatory Visit (INDEPENDENT_AMBULATORY_CARE_PROVIDER_SITE_OTHER): Payer: Medicare Other | Admitting: Pulmonary Disease

## 2021-05-22 ENCOUNTER — Other Ambulatory Visit: Payer: Self-pay

## 2021-05-22 ENCOUNTER — Encounter: Payer: Self-pay | Admitting: Pulmonary Disease

## 2021-05-22 VITALS — BP 120/60 | HR 70 | Temp 98.1°F | Ht 65.0 in | Wt 180.0 lb

## 2021-05-22 DIAGNOSIS — G4733 Obstructive sleep apnea (adult) (pediatric): Secondary | ICD-10-CM

## 2021-05-22 DIAGNOSIS — Z992 Dependence on renal dialysis: Secondary | ICD-10-CM | POA: Diagnosis not present

## 2021-05-22 DIAGNOSIS — N2581 Secondary hyperparathyroidism of renal origin: Secondary | ICD-10-CM | POA: Diagnosis not present

## 2021-05-22 DIAGNOSIS — D631 Anemia in chronic kidney disease: Secondary | ICD-10-CM | POA: Diagnosis not present

## 2021-05-22 DIAGNOSIS — N186 End stage renal disease: Secondary | ICD-10-CM | POA: Diagnosis not present

## 2021-05-22 NOTE — Progress Notes (Signed)
Jane [name]    [id number]    07/14/1946  Primary Care Physician:Jordan, [name], MD  Referring Physician: [name], [name], [place],  [place] [address]  Chief complaint:   Patient with a history of obstructive sleep apnea  HPI:  History of obstructive sleep apnea having more difficulty resting at night She is always tired  History of end-stage renal disease on dialysis 3 days a week Chronic back pain  Sleep is nonrestorative  Usually goes to bed about 7 PM Takes many minutes to fall asleep Final wake up time about 2 AM to get ready for dialysis  She does have dryness of her mouth in the mornings No sore throat No headaches in the mornings Memory is good  She always wakes up tired  She had a CPAP in the 2000's After a while developed recurrent respiratory tract infections, sinus infections during which she was not able to use her CPAP and subsequently a CPAP was stopped She used multiple different masks at the time to get used to using CPAP  She snores heavily, has witnessed apneas Has choking episodes at night  Outpatient Encounter Medications as of 05/22/2021  Medication Sig   amiodarone (PACERONE) 200 MG tablet Take 1 tablet (200 mg total) by mouth daily.   B Complex-C-Zn-Folic Acid (DIALYVITE 195 WITH ZINC) 0.8 MG TABS Take 1 tablet by mouth daily.   CALCITRIOL PO Take by mouth 3 (three) times a week.   carvedilol (COREG) 25 MG tablet Take 1.5 tablets (37.5 mg total) by mouth 2 (two) times daily with a meal. (Patient taking differently: Take 25 mg by mouth 2 (two) times daily with a meal.)   diltiazem (CARDIZEM) 60 MG tablet Take 1 tablet (60 mg total) by mouth 3 (three) times daily as needed (Atrial fibrillation).   DULoxetine (CYMBALTA) 20 MG capsule Take 2 capsules (40 mg total) by mouth daily. (Patient taking differently: Take 20 mg by mouth daily.)   ethyl chloride spray Apply 1 application topically every Monday, Wednesday,  and Friday with hemodialysis.   hydrALAZINE (APRESOLINE) 25 MG tablet Take 1 tablet (25 mg total) by mouth 3 (three) times daily. (Patient taking differently: Take 25 mg by mouth in the morning and at bedtime.)   isosorbide dinitrate (ISORDIL) 30 MG tablet Take 1 tablet (30 mg total) by mouth 3 (three) times daily. (Patient taking differently: Take 30 mg by mouth 2 (two) times daily.)   LINZESS 145 MCG CAPS capsule TAKE 1 CAPSULE BY MOUTH ONCE DAILY BEFORE BREAKFAST (Patient taking differently: Take 145 mcg by mouth daily before breakfast.)   omeprazole (PRILOSEC) 40 MG capsule TAKE 1 CAPSULE BY MOUTH TWICE DAILY BEFORE A MEAL (Patient taking differently: Take 40 mg by mouth in the morning and at bedtime. Take before a meal)   warfarin (COUMADIN) 5 MG tablet USE AS DIRECTED BY COUMADIN CLINIC (Patient taking differently: Take 2.5-5 mg by mouth See admin instructions. Currently on: 5 mg every Mon, Wed; 2.5 mg all other days)   [DISCONTINUED] albuterol (PROVENTIL HFA;VENTOLIN HFA) 108 (90 Base) MCG/ACT inhaler Inhale 1 puff into the lungs every 6 (six) hours as needed for wheezing or shortness of breath. (Patient not taking: Reported on 05/22/2021)   [DISCONTINUED] amLODipine (NORVASC) 10 MG tablet Take 1 tablet (10 mg total) by mouth daily. (Patient not taking: No sig reported)   No facility-administered encounter medications on file as of 05/22/2021.    Allergies as  of 05/22/2021 - Review Complete 05/22/2021  Allergen Reaction Noted   Latex Rash 07/09/2016   Penicillins Other (See Comments) 01/23/2014   Sulfa antibiotics Rash 01/23/2014   Tape Other (See Comments) 12/07/2012    Past Medical History:  Diagnosis Date   Acid reflux    Anemia of chronic disease    Arthritis    Asthma    Atrial fibrillation (HCC)    Bilateral carotid bruits    Complication of anesthesia    "hard to wake up, I have sleep apnea" no CPAP   Depression    Diverticulitis    Duodenal ulcer    Dysrhythmia    Afib    ESRD (end stage renal disease) ([place])    MWF [place]   Headache    History of blood transfusion    Hypertension    Malaise and fatigue    Orthostatic hypotension    Shortness of breath    " when I walk to fast"   Sleep apnea    Syncope    Tubulovillous adenoma of colon     Past Surgical History:  Procedure Laterality Date   A/V FISTULAGRAM Right 10/18/2020   Procedure: A/V FISTULAGRAM;  Surgeon: Marty Heck, MD;  Location: [facility];  Service: Cardiovascular;  Laterality: Right;   AV FISTULA PLACEMENT     BACK SURGERY     Lumbar fusion L 4 and L 5   BIOPSY  01/09/2020   Procedure: BIOPSY;  Surgeon: Rush Landmark Telford Nab., MD;  Location: [place];  Service: Gastroenterology;;   BIOPSY  01/31/2021   Procedure: BIOPSY;  Surgeon: Irving Copas., MD;  Location: [place];  Service: Gastroenterology;;   COLONOSCOPY     COLONOSCOPY N/A 02/01/2021   Procedure: COLONOSCOPY;  Surgeon: Yetta Flock, MD;  Location: [place];  Service: Gastroenterology;  Laterality: N/A;   ENDOSCOPIC MUCOSAL RESECTION N/A 01/09/2020   Procedure: ENDOSCOPIC MUCOSAL RESECTION;  Surgeon: Rush Landmark Telford Nab., MD;  Location: [place];  Service: Gastroenterology;  Laterality: N/A;   ENDOSCOPIC MUCOSAL RESECTION N/A 01/31/2021   Procedure: ENDOSCOPIC MUCOSAL RESECTION;  Surgeon: Rush Landmark Telford Nab., MD;  Location: [place];  Service: Gastroenterology;  Laterality: N/A;   ESOPHAGOGASTRODUODENOSCOPY     ESOPHAGOGASTRODUODENOSCOPY (EGD) WITH PROPOFOL N/A 01/09/2020   Procedure: ESOPHAGOGASTRODUODENOSCOPY (EGD) WITH PROPOFOL;  Surgeon: Rush Landmark Telford Nab., MD;  Location: [place];  Service: Gastroenterology;  Laterality: N/A;   ESOPHAGOGASTRODUODENOSCOPY (EGD) WITH PROPOFOL N/A 01/13/2020   Procedure: ESOPHAGOGASTRODUODENOSCOPY (EGD) WITH PROPOFOL;  Surgeon: Irene Shipper, MD;  Location: [place];  Service: Gastroenterology;  Laterality: N/A;    ESOPHAGOGASTRODUODENOSCOPY (EGD) WITH PROPOFOL N/A 01/31/2021   Procedure: ESOPHAGOGASTRODUODENOSCOPY (EGD) WITH PROPOFOL;  Surgeon: Rush Landmark Telford Nab., MD;  Location: [place];  Service: Gastroenterology;  Laterality: N/A;   EUS N/A 01/09/2020   Procedure: UPPER ENDOSCOPIC ULTRASOUND (EUS) RADIAL;  Surgeon: Irving Copas., MD;  Location: [place];  Service: Gastroenterology;  Laterality: N/A;   HEMOSTASIS CLIP PLACEMENT  01/09/2020   Procedure: HEMOSTASIS CLIP PLACEMENT;  Surgeon: Irving Copas., MD;  Location: [place];  Service: Gastroenterology;;   HEMOSTASIS CLIP PLACEMENT  01/31/2021   Procedure: HEMOSTASIS CLIP PLACEMENT;  Surgeon: Irving Copas., MD;  Location: [place];  Service: Gastroenterology;;   HEMOSTASIS CONTROL  01/13/2020   Procedure: HEMOSTASIS CONTROL;  Surgeon: Irene Shipper, MD;  Location: [facility];  Service: Gastroenterology;;  hemaspray   HOT HEMOSTASIS N/A 01/13/2020   Procedure: HOT HEMOSTASIS (ARGON PLASMA COAGULATION/BICAP);  Surgeon:  Irene Shipper, MD;  Location[facility];  Service: Gastroenterology;  Laterality: N/A;   HOT HEMOSTASIS N/A 02/01/2021   Procedure: HOT HEMOSTASIS (ARGON PLASMA COAGULATION/BICAP);  Surgeon: Yetta Flock, MD;  Location: [facility];  Service: Gastroenterology;  Laterality: N/A;   IR GENERIC HISTORICAL  07/09/2016   IR US GUIDE VASC ACCESS RIGHT 07/09/2016 Arne Cleveland, MD [facility]   IR GENERIC HISTORICAL  07/09/2016   IR FLUORO GUIDE CV LINE RIGHT 07/09/2016 Arne Cleveland, MD [facility]   KNEE ARTHROPLASTY Left    LAPAROSCOPIC SIGMOID COLECTOMY N/A 07/11/2016   Procedure: LAPAROSCOPIC SIGMOID COLECTOMY;  Surgeon: Clovis Riley, MD;  Location: [place];  Service: General;  Laterality: N/A;   MASS EXCISION Right 04/10/2020   Procedure: EXCISION SKIN NODULE RIGHT FOREARM;  Surgeon: Waynetta Sandy, MD;  Location: [place];  Service: Vascular;  Laterality: Right;    SCLEROTHERAPY  01/09/2020   Procedure: Clide Deutscher;  Surgeon: Mansouraty, Telford Nab., MD;  Location: [place];  Service: Gastroenterology;;   Clide Deutscher  01/13/2020   Procedure: Clide Deutscher;  Surgeon: Irene Shipper, MD;  Location: [facility]COPY;  Service: Gastroenterology;;   SUBMUCOSAL LIFTING INJECTION  01/09/2020   Procedure: SUBMUCOSAL LIFTING INJECTION;  Surgeon: Irving Copas., MD;  Location: [facility] ENDOSCOPY;  Service: Gastroenterology;;   TUBAL LIGATION      Family History  Problem Relation Age of Onset   Heart failure Mother    Stroke Mother    Other Father    Colon cancer Neg Hx    Liver disease Neg Hx    Esophageal cancer Neg Hx    Stomach cancer Neg Hx    Inflammatory bowel disease Neg Hx    Rectal cancer Neg Hx    Pancreatic cancer Neg Hx     Social History   Socioeconomic History   Marital status: Divorced    Spouse name: Not on file   Number of children: 4   Years of education: 14   Highest education level: Associate degree: occupational, Hotel manager, or vocational program  Occupational History   Occupation: Retired  Tobacco Use   Smoking status: Never   Smokeless tobacco: Never  Vaping Use   Vaping Use: Never used  Substance and Sexual Activity   Alcohol use: No   Drug use: No   Sexual activity: Never  Other Topics Concern   Not on file  Social History Narrative   HH 1   Divorced   Outpatient dialysis Mon, Wed, Fri   4 children: 1 daughter locally is an Designer, multimedia and 3 sons in [place] Determinants of Health   Financial Resource Strain: Low Risk    Difficulty of Paying Living Expenses: Not hard at all  Food Insecurity: No Food Insecurity   Worried About Charity fundraiser in the Last Year: Never true   Arboriculturist in the Last Year: Never true  Transportation Needs: No Transportation Needs   Lack of Transportation (Medical): No   Lack of Transportation (Non-Medical): No  Physical Activity: Inactive   Days of  Exercise per Week: 0 days   Minutes of Exercise per Session: 0 min  Stress: Stress Concern Present   Feeling of Stress : To some extent  Social Connections: Moderately Isolated   Frequency of Communication with Friends and Family: More than three times a week   Frequency of Social Gatherings with Friends and Family: Never   Attends Religious Services: More than 4 times per year  Active Member of Clubs or Organizations: No   Attends Archivist Meetings: Never   Marital Status: Divorced  Human resources officer Violence: Not At Risk   Fear of Current or Ex-Partner: No   Emotionally Abused: No   Physically Abused: No   Sexually Abused: No    Review of Systems  Constitutional:  Positive for fatigue.  Psychiatric/Behavioral:  Positive for sleep disturbance.    Vitals:   05/22/21 1510  BP: 120/60  Pulse: 70  Temp: 98.1 F (36.7 C)  SpO2: 98%     Physical Exam Constitutional:      Appearance: She is obese.  HENT:     Head: Normocephalic.     Nose: Nose normal.     Mouth/Throat:     Mouth: Mucous membranes are moist.     Comments: Mallampati 4, crowded oropharynx, macroglossia Eyes:     General:        Right eye: No discharge.        Left eye: No discharge.     Pupils: Pupils are equal, round, and reactive to light.  Cardiovascular:     Rate and Rhythm: Normal rate and regular rhythm.     Heart sounds: No murmur heard.   No friction rub.  Pulmonary:     Effort: No respiratory distress.     Breath sounds: No stridor. No wheezing or rhonchi.  Musculoskeletal:     Cervical back: No rigidity or tenderness.  Neurological:     Mental Status: She is alert.  Psychiatric:        Mood and Affect: Mood normal.    Results of the Epworth flowsheet 05/22/2021  Sitting and reading 1  Watching TV 1  Sitting, inactive in a public place (e.g. a theatre or a meeting) 0  As a passenger in a car for an hour without a break 0  Lying down to rest in the afternoon when  circumstances permit 0  Sitting and talking to someone 0  Sitting quietly after a lunch without alcohol 0  In a car, while stopped for a few minutes in traffic 0  Total score 2    Data Reviewed: Previous study not available  Assessment:  Excessive fatigue  Past history of obstructive sleep apnea  Nonrestorative sleep  End-stage renal disease on hemodialysis  Likelihood of significant sleep disordered breathing does exist, possibility of central sleep apnea also does exist  Pathophysiology of sleep disordered breathing reviewed with the patient Treatment options discussed with the patient  Plan/Recommendations: I will schedule the patient for an in lab split-night study  Treatment options, risk of not treating sleep disordered breathing discussed  Tentative follow-up in 3 to 4 months  Encouraged to call with any significant concerns  Encouraged to stay active and behavioral modifications to get an adequate number of hours of sleep   Sherrilyn Rist MD [facility] 05/22/2021, 3:27 PM  CC: [name], [name], MD

## 2021-05-22 NOTE — Patient Instructions (Signed)
History of obstructive sleep apnea  We will schedule you for an in lab sleep study  CPAP therapy will be initiated following study  I will see you back in about 3 to 4 months  Call with significant concerns

## 2021-05-24 DIAGNOSIS — D631 Anemia in chronic kidney disease: Secondary | ICD-10-CM | POA: Diagnosis not present

## 2021-05-24 DIAGNOSIS — N186 End stage renal disease: Secondary | ICD-10-CM | POA: Diagnosis not present

## 2021-05-24 DIAGNOSIS — N2581 Secondary hyperparathyroidism of renal origin: Secondary | ICD-10-CM | POA: Diagnosis not present

## 2021-05-24 DIAGNOSIS — Z992 Dependence on renal dialysis: Secondary | ICD-10-CM | POA: Diagnosis not present

## 2021-05-25 DIAGNOSIS — I10 Essential (primary) hypertension: Secondary | ICD-10-CM | POA: Diagnosis not present

## 2021-05-27 ENCOUNTER — Other Ambulatory Visit: Payer: Self-pay | Admitting: Gastroenterology

## 2021-05-27 DIAGNOSIS — Z992 Dependence on renal dialysis: Secondary | ICD-10-CM | POA: Diagnosis not present

## 2021-05-27 DIAGNOSIS — D631 Anemia in chronic kidney disease: Secondary | ICD-10-CM | POA: Diagnosis not present

## 2021-05-27 DIAGNOSIS — N186 End stage renal disease: Secondary | ICD-10-CM | POA: Diagnosis not present

## 2021-05-27 DIAGNOSIS — N2581 Secondary hyperparathyroidism of renal origin: Secondary | ICD-10-CM | POA: Diagnosis not present

## 2021-05-29 DIAGNOSIS — D631 Anemia in chronic kidney disease: Secondary | ICD-10-CM | POA: Diagnosis not present

## 2021-05-29 DIAGNOSIS — N2581 Secondary hyperparathyroidism of renal origin: Secondary | ICD-10-CM | POA: Diagnosis not present

## 2021-05-29 DIAGNOSIS — Z992 Dependence on renal dialysis: Secondary | ICD-10-CM | POA: Diagnosis not present

## 2021-05-29 DIAGNOSIS — N186 End stage renal disease: Secondary | ICD-10-CM | POA: Diagnosis not present

## 2021-05-29 DIAGNOSIS — I4891 Unspecified atrial fibrillation: Secondary | ICD-10-CM | POA: Diagnosis not present

## 2021-05-31 ENCOUNTER — Other Ambulatory Visit: Payer: Self-pay

## 2021-05-31 ENCOUNTER — Ambulatory Visit (AMBULATORY_SURGERY_CENTER): Payer: Medicare Other | Admitting: Internal Medicine

## 2021-05-31 ENCOUNTER — Encounter: Payer: Self-pay | Admitting: Internal Medicine

## 2021-05-31 VITALS — BP 144/66 | HR 62 | Temp 96.2°F | Resp 17 | Ht 65.0 in | Wt 181.0 lb

## 2021-05-31 DIAGNOSIS — K219 Gastro-esophageal reflux disease without esophagitis: Secondary | ICD-10-CM | POA: Diagnosis not present

## 2021-05-31 DIAGNOSIS — N186 End stage renal disease: Secondary | ICD-10-CM | POA: Diagnosis not present

## 2021-05-31 DIAGNOSIS — I120 Hypertensive chronic kidney disease with stage 5 chronic kidney disease or end stage renal disease: Secondary | ICD-10-CM | POA: Diagnosis not present

## 2021-05-31 DIAGNOSIS — K317 Polyp of stomach and duodenum: Secondary | ICD-10-CM | POA: Diagnosis not present

## 2021-05-31 DIAGNOSIS — R634 Abnormal weight loss: Secondary | ICD-10-CM | POA: Diagnosis not present

## 2021-05-31 DIAGNOSIS — R1310 Dysphagia, unspecified: Secondary | ICD-10-CM

## 2021-05-31 DIAGNOSIS — G4733 Obstructive sleep apnea (adult) (pediatric): Secondary | ICD-10-CM | POA: Diagnosis not present

## 2021-05-31 MED ORDER — PANTOPRAZOLE SODIUM 40 MG PO TBEC: 40.0000 mg | DELAYED_RELEASE_TABLET | Freq: Two times a day (BID) | ORAL | 3 refills | Status: DC

## 2021-05-31 MED ORDER — SODIUM CHLORIDE 0.9 % IV SOLN
500.0000 mL | Freq: Once | INTRAVENOUS | Status: DC
Start: 2021-05-31 — End: 2021-05-31

## 2021-05-31 NOTE — Patient Instructions (Signed)
You may resume your coumadin today.  Your new medication is in your pharmacy.  Take it as directed.  Read the instructions given to you by your recovery  room nurse.  YOU HAD AN ENDOSCOPIC PROCEDURE TODAY AT [facility]:   Refer to the procedure report that was given to you for any specific questions about what was found during the examination.  If the procedure report does not answer your questions, please call your gastroenterologist to clarify.  If you requested that your care partner not be given the details of your procedure findings, then the procedure report has been included in a sealed envelope for you to review at your convenience later.  YOU SHOULD EXPECT: Some feelings of bloating in the abdomen. Passage of more gas than usual.  Walking can help get rid of the air that was put into your GI tract during the procedure and reduce the bloating. If you had a lower endoscopy (such as a colonoscopy or flexible sigmoidoscopy) you may notice spotting of blood in your stool or on the toilet paper. If you underwent a bowel prep for your procedure, you may not have a normal bowel movement for a few days.  Please Note:  You might notice some irritation and congestion in your nose or some drainage.  This is from the oxygen used during your procedure.  There is no need for concern and it should clear up in a day or so.  SYMPTOMS TO REPORT IMMEDIATELY:   Following upper endoscopy (EGD)  Vomiting of blood or coffee ground material  New chest pain or pain under the shoulder blades  Painful or persistently difficult swallowing  New shortness of breath  Fever of 100F or higher  Black, tarry-looking stools  For urgent or emergent issues, a gastroenterologist can be reached at any hour by calling [phone]. Do not use MyChart messaging for urgent concerns.    DIET:  We do recommend a small meal at first, but then you may proceed to your regular diet.  Drink plenty of fluids but  you should avoid alcoholic beverages for 24 hours. You may resume your previous diet.  ACTIVITY:  You should plan to take it easy for the rest of today and you should NOT DRIVE or use heavy machinery until tomorrow (because of the sedation medicines used during the test).    FOLLOW UP: Our staff will call the number listed on your records 48-72 hours following your procedure to check on you and address any questions or concerns that you may have regarding the information given to you following your procedure. If we do not reach you, we will leave a message.  We will attempt to reach you two times.  During this call, we will ask if you have developed any symptoms of COVID 19. If you develop any symptoms (ie: fever, flu-like symptoms, shortness of breath, cough etc.) before then, please call [phone].  If you test positive for Covid 19 in the 2 weeks post procedure, please call and report this information to [place].    If any biopsies were taken you will be contacted by phone or by letter within the next 1-3 weeks.  Please call us at [phone] if you have not heard about the biopsies in 3 weeks.    SIGNATURES/CONFIDENTIALITY: You and/or your care partner have signed paperwork which will be entered into your electronic medical record.  These signatures attest to the fact that that the information above on your  After Visit Summary has been reviewed and is understood.  Full responsibility of the confidentiality of this discharge information lies with you and/or your care-partner.

## 2021-05-31 NOTE — Progress Notes (Signed)
Report to PACU, RN, vss, BBS= Clear.  

## 2021-05-31 NOTE — Progress Notes (Signed)
Pt's states no medical or surgical changes since previsit or office visit. VS assessed by D.T 

## 2021-05-31 NOTE — Progress Notes (Signed)
Called to room to assist during endoscopic procedure.  Patient ID and intended procedure confirmed with present staff. Received instructions for my participation in the procedure from the performing physician.  

## 2021-05-31 NOTE — Op Note (Signed)
[facility] Patient Name: Jane [name] Procedure Date: 05/31/2021 10:07 AM MRN: [id number] Endoscopist: Jerene Bears , MD Age: 75 Referring MD:  Date of Birth: [birth year] Gender: Female Account #: [id number] Procedure:                Upper GI endoscopy Indications:              Dysphagia, Weight loss, personal history of TVA of                            duodenum s/p EMR with last surveillance EGD                            negative for residual adenoma (July 2022) Medicines:                Monitored Anesthesia Care Procedure:                Pre-Anesthesia Assessment:                           - Prior to the procedure, a History and Physical                            was performed, and patient medications and                            allergies were reviewed. The patient's tolerance of                            previous anesthesia was also reviewed. The risks                            and benefits of the procedure and the sedation                            options and risks were discussed with the patient.                            All questions were answered, and informed consent                            was obtained. Prior Anticoagulants: The patient has                            taken Coumadin (warfarin), last dose was 5 days                            prior to procedure. ASA Grade Assessment: III - A                            patient with severe systemic disease. After                            reviewing the risks and benefits, the patient was  deemed in satisfactory condition to undergo the                            procedure.                           After obtaining informed consent, the endoscope was                            passed under direct vision. Throughout the                            procedure, the patient's blood pressure, pulse, and                            oxygen saturations were monitored continuously. The                             Endoscope was introduced through the mouth, and                            advanced to the second part of duodenum. The upper                            GI endoscopy was accomplished without difficulty.                            The patient tolerated the procedure well. Scope In: Scope Out: Findings:                 The examined esophagus was normal.                           No endoscopic abnormality was evident in the                            esophagus to explain the patient's complaint of                            dysphagia. It was decided, however, to proceed with                            dilation of the entire esophagus. The scope was                            withdrawn. Dilation was performed with a Maloney                            dilator with mild resistance at 52 Fr.                           The cardia and gastric fundus were normal on                            retroflexion.  Multiple 5 to 9 mm sessile polyps with no active                            bleeding were found in the gastric body and on the                            greater curvature of the gastric antrum. Biopsies                            (4 polyps were sampled) were taken with a cold                            forceps for histology.                           Mild mucosal changes characterized by                            discoloration/melanosis were found in the gastric                            antrum.                           Nodular/polypoid mucosa was found in the second                            portion of the duodenum. Biopsies were taken with a                            cold forceps for histology to exclude recurrent                            adenoma. Complications:            No immediate complications. Estimated Blood Loss:     Estimated blood loss was minimal. Impression:               - Normal esophagus.                           - No  endoscopic esophageal abnormality to explain                            patient's dysphagia. Esophagus dilated with 52 Fr                            Maloney                           - Multiple gastric polyps. Biopsied/sampled.                           - Melanotic mucosa in the antrum.                           -  Polypoid mucosa in the second portion of the                            duodenum in area of previous EMR. Biopsied to                            exclude recurrent adenoma. Recommendation:           - Patient has a contact number available for                            emergencies. The signs and symptoms of potential                            delayed complications were discussed with the                            patient. Return to normal activities tomorrow.                            Written discharge instructions were provided to the                            patient.                           - Resume previous diet.                           - Continue present medications.                           - Resume Coumadin (warfarin) at prior dose today.                            Refer to [facility] Clinic for further adjustment of                            therapy.                           - Await pathology results.                           - Office follow-up in 1 month (me or APP) to assess                            response and continue evaluation of weight loss if                            persistent. Jerene Bears, MD 05/31/2021 10:38:35 AM This report has been signed electronically.

## 2021-05-31 NOTE — Progress Notes (Signed)
See recent office note for details.  Patient remains appropriate for upper endoscopy today.  No chest pain or shortness of breath today.  She has been off of her warfarin x5 days.  The nature of the procedure, as well as the risks, benefits, and alternatives were carefully and thoroughly reviewed with the patient. Ample time for discussion and questions allowed. The patient understood, was satisfied, and agreed to proceed.

## 2021-06-03 ENCOUNTER — Ambulatory Visit: Payer: Medicare Other | Admitting: Family Medicine

## 2021-06-03 DIAGNOSIS — N186 End stage renal disease: Secondary | ICD-10-CM | POA: Diagnosis not present

## 2021-06-03 DIAGNOSIS — N2581 Secondary hyperparathyroidism of renal origin: Secondary | ICD-10-CM | POA: Diagnosis not present

## 2021-06-03 DIAGNOSIS — Z992 Dependence on renal dialysis: Secondary | ICD-10-CM | POA: Diagnosis not present

## 2021-06-03 DIAGNOSIS — D631 Anemia in chronic kidney disease: Secondary | ICD-10-CM | POA: Diagnosis not present

## 2021-06-04 ENCOUNTER — Telehealth: Payer: Self-pay

## 2021-06-04 NOTE — Telephone Encounter (Signed)
  Follow up Call-  Call back number 05/31/2021 11/17/2019  Post procedure Call Back phone  # [phone]  Permission to leave phone message Yes Yes  Some recent data might be hidden     Patient questions:  Do you have a fever, pain , or abdominal swelling? No. Pain Score  0 *  Have you tolerated food without any problems? Yes.    Have you been able to return to your normal activities? Yes.    Do you have any questions about your discharge instructions: Diet   No. Medications  No. Follow up visit  No.  Do you have questions or concerns about your Care? No.  Actions: * If pain score is 4 or above: No action needed, pain <4.   Have you developed a fever since your procedure? No   2.   Have you had an respiratory symptoms (SOB or cough) since your procedure? No   3.   Have you tested positive for COVID 19 since your procedure no   4.   Have you had any family members/close contacts diagnosed with the COVID 19 since your procedure?  No    If yes to any of these questions please route to Joylene John, RN and Joella Prince, RN

## 2021-06-05 ENCOUNTER — Encounter: Payer: Self-pay | Admitting: Internal Medicine

## 2021-06-05 DIAGNOSIS — Z992 Dependence on renal dialysis: Secondary | ICD-10-CM | POA: Diagnosis not present

## 2021-06-05 DIAGNOSIS — N2581 Secondary hyperparathyroidism of renal origin: Secondary | ICD-10-CM | POA: Diagnosis not present

## 2021-06-05 DIAGNOSIS — N186 End stage renal disease: Secondary | ICD-10-CM | POA: Diagnosis not present

## 2021-06-05 DIAGNOSIS — D631 Anemia in chronic kidney disease: Secondary | ICD-10-CM | POA: Diagnosis not present

## 2021-06-06 ENCOUNTER — Ambulatory Visit: Payer: Medicare Other | Admitting: Pharmacist

## 2021-06-06 ENCOUNTER — Other Ambulatory Visit: Payer: Self-pay

## 2021-06-06 DIAGNOSIS — I480 Paroxysmal atrial fibrillation: Secondary | ICD-10-CM

## 2021-06-06 DIAGNOSIS — Z7901 Long term (current) use of anticoagulants: Secondary | ICD-10-CM | POA: Diagnosis not present

## 2021-06-06 DIAGNOSIS — Z5181 Encounter for therapeutic drug level monitoring: Secondary | ICD-10-CM

## 2021-06-06 LAB — POCT INR: INR: 1.4 — AB (ref 2.0–3.0)

## 2021-06-06 NOTE — Progress Notes (Signed)
Anticoagulation Management Jane [name] is a 75 y.o. female who reports to the clinic for monitoring of warfarin treatment.    Indication: atrial fibrillation CHA2DS2 Vasc Score 4 (Age >30, female, HTN hx), HAS-BLED 2 (Age>65, renal disease)  Duration: indefinite Supervising physician: Adrian Prows  Anticoagulation Clinic Visit History:  Patient does report signs/symptoms of bleeding or thromboembolism.  Reports no changes in diet, medications, or lifestyle.   S/p recent EGD on 05/31/21. EGD note states normal esophagus and no noted abnormalities that would explain's pt's dysphagia. Esophagus dilated again. Multiple gastic polyps noted that were biopsied. Waiting for pathology report. Noted to have melanotic mucosa in the antrum. Pt denies any recent episodes of melena or hematochezia. Pt restarted on warfarin on 05/31/21.    Pt currently on amiodarone 200 mg qday for now  Amlodipine stopped per nephrology due to increased leg swelling. Current antihypertensive consists of hydralazine 25 mg BID, isordil 30 mg BID, carvedilol 25 mg BID.  Anticoagulation Episode Summary     Current INR goal:  2.0-3.0  TTR:  64.6 % (2.7 y)  Next INR check:  06/11/2021  INR from last check:  1.4 (06/06/2021)  Weekly max warfarin dose:    Target end date:  Indefinite  INR check location:    Preferred lab:    Send INR reminders to:     Indications   Paroxysmal atrial fibrillation (HCC) [I48.0] Monitoring for long-term anticoagulant use [Z51.81 Z79.01]        Comments:           Allergies  Allergen Reactions   Latex Rash   Penicillins Other (See Comments)    Yeast infection / Childhood   Sulfa Antibiotics Rash   Tape Other (See Comments)    Plastic, silicone, and paper tape causes bruising and pulls off skin. Cloth tape works fine    Current Outpatient Medications:    amiodarone (PACERONE) 200 MG tablet, Take 1 tablet (200 mg total) by mouth daily., Disp: 90 tablet, Rfl: 1   B  Complex-C-Zn-Folic Acid (DIALYVITE 097 WITH ZINC) 0.8 MG TABS, Take 1 tablet by mouth daily., Disp: , Rfl:    CALCITRIOL PO, Take by mouth 3 (three) times a week., Disp: , Rfl:    carvedilol (COREG) 25 MG tablet, Take 1.5 tablets (37.5 mg total) by mouth 2 (two) times daily with a meal. (Patient taking differently: Take 25 mg by mouth 2 (two) times daily with a meal.), Disp: 240 tablet, Rfl: 3   diltiazem (CARDIZEM) 60 MG tablet, Take 1 tablet (60 mg total) by mouth 3 (three) times daily as needed (Atrial fibrillation)., Disp: 30 tablet, Rfl: 2   DULoxetine (CYMBALTA) 20 MG capsule, Take 2 capsules (40 mg total) by mouth daily. (Patient taking differently: Take 20 mg by mouth daily.), Disp: 180 capsule, Rfl: 2   ethyl chloride spray, Apply 1 application topically every Monday, Wednesday, and Friday with hemodialysis., Disp: , Rfl: 12   hydrALAZINE (APRESOLINE) 25 MG tablet, Take 1 tablet (25 mg total) by mouth 3 (three) times daily. (Patient taking differently: Take 25 mg by mouth in the morning and at bedtime.), Disp: 270 tablet, Rfl: 3   isosorbide dinitrate (ISORDIL) 30 MG tablet, Take 1 tablet (30 mg total) by mouth 3 (three) times daily. (Patient taking differently: Take 30 mg by mouth 2 (two) times daily.), Disp: 270 tablet, Rfl: 3   LINZESS 145 MCG CAPS capsule, TAKE 1 CAPSULE BY MOUTH ONCE DAILY BEFORE BREAKFAST, Disp: 30 capsule, Rfl: 2  pantoprazole (PROTONIX) 40 MG tablet, Take 1 tablet (40 mg total) by mouth 2 (two) times daily., Disp: 90 tablet, Rfl: 3   warfarin (COUMADIN) 5 MG tablet, USE AS DIRECTED BY COUMADIN CLINIC (Patient taking differently: Take 2.5-5 mg by mouth See admin instructions. Currently on: 5 mg every Mon, Wed; 2.5 mg all other days), Disp: 113 tablet, Rfl: 5 Past Medical History:  Diagnosis Date   Acid reflux    Anemia of chronic disease    Arthritis    Asthma    Atrial fibrillation (HCC)    Bilateral carotid bruits    Complication of anesthesia    "hard to wake  up, I have sleep apnea" no CPAP   Diverticulitis    Duodenal ulcer    Dysrhythmia    Afib   ESRD (end stage renal disease) ([place])    MWF [place]   Headache    Heart murmur    History of blood transfusion    Hypertension    Malaise and fatigue    Orthostatic hypotension    Shortness of breath    " when I walk to fast"   Sleep apnea    Syncope    Tubulovillous adenoma of colon    ASSESSMENT  Recent Results: The most recent result is correlated with 22.5 mg per week:  Lab Results  Component Value Date   INR 1.4 (A) 06/06/2021   INR 1.6 (A) 05/16/2021   INR 2.4 04/30/2021    Anticoagulation Dosing: Description   INR below goal. Take 7.5 mg today and 5 mg this Fri, Sat, Mon, and 2.5 mg on Sun. Recheck INR in 5 days.       INR today: Subtherapeutic. Recent held doses in setting of EGD procedure. Previously stable and therapeutic on current weekly dose. 6 days post procedure. Unclear reason for the subtherapeutic INR readings today. Denies any missed or skipped doses. Denies any changes in diet, medications, or lifestyle. Will boost over the next few days and have a close follow up. Recent Amiodarone dose restarted back on 200 mg day.   PLAN Weekly dose was increased by 11.1 % to 25 mg/week. Take 7.5 mg today and 5 mg on Fri, Sat, Mon and 2.5 mg on Sun. Recheck INR in 5 days.   Patient Instructions  INR below goal. Take 7.5 mg today and 5 mg this Fri, Sat, Mon, and 2.5 mg on Sun. Recheck INR in 5 days.  Patient advised to contact clinic or seek medical attention if signs/symptoms of bleeding or thromboembolism occur.  Patient verbalized understanding by repeating back information and was advised to contact me if further medication-related questions arise.   Follow-up Return in about 5 days (around 06/11/2021).  [name], PharmD  15 minutes spent face-to-face with the patient during the encounter. 50% of time spent on education, including signs/sx bleeding and  clotting, as well as food and drug interactions with warfarin. 50% of time was spent on fingerprick POC INR sample collection,processing, results determination, and documentation

## 2021-06-06 NOTE — Patient Instructions (Signed)
INR below goal. Take 7.5 mg today and 5 mg this Fri, Sat, Mon, and 2.5 mg on Sun. Recheck INR in 5 days.

## 2021-06-07 DIAGNOSIS — N2581 Secondary hyperparathyroidism of renal origin: Secondary | ICD-10-CM | POA: Diagnosis not present

## 2021-06-07 DIAGNOSIS — D631 Anemia in chronic kidney disease: Secondary | ICD-10-CM | POA: Diagnosis not present

## 2021-06-07 DIAGNOSIS — Z992 Dependence on renal dialysis: Secondary | ICD-10-CM | POA: Diagnosis not present

## 2021-06-07 DIAGNOSIS — N186 End stage renal disease: Secondary | ICD-10-CM | POA: Diagnosis not present

## 2021-06-10 DIAGNOSIS — D631 Anemia in chronic kidney disease: Secondary | ICD-10-CM | POA: Diagnosis not present

## 2021-06-10 DIAGNOSIS — N2581 Secondary hyperparathyroidism of renal origin: Secondary | ICD-10-CM | POA: Diagnosis not present

## 2021-06-10 DIAGNOSIS — N186 End stage renal disease: Secondary | ICD-10-CM | POA: Diagnosis not present

## 2021-06-10 DIAGNOSIS — Z992 Dependence on renal dialysis: Secondary | ICD-10-CM | POA: Diagnosis not present

## 2021-06-11 ENCOUNTER — Ambulatory Visit: Payer: Medicare Other | Admitting: Pharmacist

## 2021-06-11 ENCOUNTER — Telehealth: Payer: Self-pay | Admitting: Internal Medicine

## 2021-06-11 ENCOUNTER — Ambulatory Visit: Payer: Medicare Other | Admitting: Family Medicine

## 2021-06-11 ENCOUNTER — Other Ambulatory Visit: Payer: Self-pay

## 2021-06-11 DIAGNOSIS — Z5181 Encounter for therapeutic drug level monitoring: Secondary | ICD-10-CM | POA: Diagnosis not present

## 2021-06-11 DIAGNOSIS — I480 Paroxysmal atrial fibrillation: Secondary | ICD-10-CM | POA: Diagnosis not present

## 2021-06-11 DIAGNOSIS — Z7901 Long term (current) use of anticoagulants: Secondary | ICD-10-CM

## 2021-06-11 LAB — POCT INR: INR: 2.4 (ref 2.0–3.0)

## 2021-06-11 NOTE — Telephone Encounter (Signed)
Inbound call from Patient requesting results from Endo procedure. Please call back.

## 2021-06-11 NOTE — Patient Instructions (Signed)
INR at goal. Continue current weekly dose of 5 mg every Mon, Wed, Fri and 2.5 mg all other days. Recheck INR in 2 weeks.

## 2021-06-11 NOTE — Progress Notes (Signed)
Anticoagulation Management Jane [name] is a 75 y.o. female who reports to the clinic for monitoring of warfarin treatment.    Indication: atrial fibrillation CHA2DS2 Vasc Score 4 (Age >29, female, HTN hx), HAS-BLED 2 (Age>65, renal disease)  Duration: indefinite Supervising physician: Adrian Prows  Anticoagulation Clinic Visit History:  Patient does report signs/symptoms of bleeding or thromboembolism.  Reports no changes in diet, medications, or lifestyle.   S/p recent EGD on 05/31/21. EGD note states normal esophagus and no noted abnormalities that would explain's pt's dysphagia. Esophagus dilated again. Multiple gastic polyps noted that were biopsied. Waiting for pathology report. Noted to have melanotic mucosa in the antrum. Pt denies any recent episodes of melena or hematochezia. Pt restarted on warfarin on 05/31/21.    Pt reports that she is still experiencing noticeable complains of dysphagia despite recent esophageal dilation. Reports that she has trouble swallowing her food when she eats. Denies any concerns with ensuring that she is able to tolerate oral intake of her medications currently. Reports that she is waiting to follow up with her GI provider to get pathology results and address her symptoms. Next scheduled GI OV on 08/07/21.   Pt currently on amiodarone 200 mg qday for now  Amlodipine stopped per nephrology due to increased leg swelling. Current antihypertensive consists of hydralazine 25 mg BID, isordil 30 mg BID, carvedilol 25 mg BID.  Anticoagulation Episode Summary     Current INR goal:  2.0-3.0  TTR:  64.5 % (2.7 y)  Next INR check:  06/25/2021  INR from last check:  2.4 (06/11/2021)  Weekly max warfarin dose:    Target end date:  Indefinite  INR check location:    Preferred lab:    Send INR reminders to:     Indications   Paroxysmal atrial fibrillation (HCC) [I48.0] Monitoring for long-term anticoagulant use [Z51.81 Z79.01]        Comments:            Allergies  Allergen Reactions   Latex Rash   Penicillins Other (See Comments)    Yeast infection / Childhood   Sulfa Antibiotics Rash   Tape Other (See Comments)    Plastic, silicone, and paper tape causes bruising and pulls off skin. Cloth tape works fine    Current Outpatient Medications:    amiodarone (PACERONE) 200 MG tablet, Take 1 tablet (200 mg total) by mouth daily., Disp: 90 tablet, Rfl: 1   B Complex-C-Zn-Folic Acid (DIALYVITE 166 WITH ZINC) 0.8 MG TABS, Take 1 tablet by mouth daily., Disp: , Rfl:    CALCITRIOL PO, Take by mouth 3 (three) times a week., Disp: , Rfl:    carvedilol (COREG) 25 MG tablet, Take 1.5 tablets (37.5 mg total) by mouth 2 (two) times daily with a meal. (Patient taking differently: Take 25 mg by mouth 2 (two) times daily with a meal.), Disp: 240 tablet, Rfl: 3   diltiazem (CARDIZEM) 60 MG tablet, Take 1 tablet (60 mg total) by mouth 3 (three) times daily as needed (Atrial fibrillation)., Disp: 30 tablet, Rfl: 2   DULoxetine (CYMBALTA) 20 MG capsule, Take 2 capsules (40 mg total) by mouth daily. (Patient taking differently: Take 20 mg by mouth daily.), Disp: 180 capsule, Rfl: 2   ethyl chloride spray, Apply 1 application topically every Monday, Wednesday, and Friday with hemodialysis., Disp: , Rfl: 12   hydrALAZINE (APRESOLINE) 25 MG tablet, Take 1 tablet (25 mg total) by mouth 3 (three) times daily. (Patient taking differently: Take 25 mg by  mouth in the morning and at bedtime.), Disp: 270 tablet, Rfl: 3   isosorbide dinitrate (ISORDIL) 30 MG tablet, Take 1 tablet (30 mg total) by mouth 3 (three) times daily. (Patient taking differently: Take 30 mg by mouth 2 (two) times daily.), Disp: 270 tablet, Rfl: 3   LINZESS 145 MCG CAPS capsule, TAKE 1 CAPSULE BY MOUTH ONCE DAILY BEFORE BREAKFAST, Disp: 30 capsule, Rfl: 2   pantoprazole (PROTONIX) 40 MG tablet, Take 1 tablet (40 mg total) by mouth 2 (two) times daily., Disp: 90 tablet, Rfl: 3   warfarin (COUMADIN) 5  MG tablet, USE AS DIRECTED BY COUMADIN CLINIC (Patient taking differently: Take 2.5-5 mg by mouth See admin instructions. Currently on: 5 mg every Mon, Wed; 2.5 mg all other days), Disp: 113 tablet, Rfl: 5 Past Medical History:  Diagnosis Date   Acid reflux    Anemia of chronic disease    Arthritis    Asthma    Atrial fibrillation (HCC)    Bilateral carotid bruits    Complication of anesthesia    "hard to wake up, I have sleep apnea" no CPAP   Diverticulitis    Duodenal ulcer    Dysrhythmia    Afib   ESRD (end stage renal disease) ([place])    MWF [place]   Headache    Heart murmur    History of blood transfusion    Hypertension    Malaise and fatigue    Orthostatic hypotension    Shortness of breath    " when I walk to fast"   Sleep apnea    Syncope    Tubulovillous adenoma of colon    ASSESSMENT  Recent Results: The most recent result is correlated with 32.5 mg per week:  Lab Results  Component Value Date   INR 2.4 06/11/2021   INR 1.4 (A) 06/06/2021   INR 1.6 (A) 05/16/2021    Anticoagulation Dosing: Description   INR at goal. Continue current weekly dose of 5 mg every Mon, Wed, Fri and 2.5 mg all other days. Recheck INR in 2 weeks.       INR today: Therapeutic. Improved following recent weekly dose increase. Pt reports improved appetite and reports to staying consistent with her Vit K intake. Denies any missed or skipped doses. Denies any changes in diet, medications, or lifestyle. Amiodarone dose stable on 200 mg day. Will continue current weekly dose and continue close monitoring in setting of recent labile INR readings.   PLAN Weekly dose was unchanged by 0% to 25 mg/week. Continue current weekly dose of 5 mg every Mon, Wed, Fri and 2.5 mg all other days. Recheck INR in 2 weeks.   Patient Instructions  INR at goal. Continue current weekly dose of 5 mg every Mon, Wed, Fri and 2.5 mg all other days. Recheck INR in 2 weeks.  Patient advised to contact  clinic or seek medical attention if signs/symptoms of bleeding or thromboembolism occur.  Patient verbalized understanding by repeating back information and was advised to contact me if further medication-related questions arise.   Follow-up Return in about 2 weeks (around 06/25/2021).  [name], PharmD  15 minutes spent face-to-face with the patient during the encounter. 50% of time spent on education, including signs/sx bleeding and clotting, as well as food and drug interactions with warfarin. 50% of time was spent on fingerprick POC INR sample collection,processing, results determination, and documentation

## 2021-06-11 NOTE — Telephone Encounter (Signed)
Pathology letter discussed with patient.

## 2021-06-12 DIAGNOSIS — N186 End stage renal disease: Secondary | ICD-10-CM | POA: Diagnosis not present

## 2021-06-12 DIAGNOSIS — D631 Anemia in chronic kidney disease: Secondary | ICD-10-CM | POA: Diagnosis not present

## 2021-06-12 DIAGNOSIS — Z992 Dependence on renal dialysis: Secondary | ICD-10-CM | POA: Diagnosis not present

## 2021-06-12 DIAGNOSIS — N2581 Secondary hyperparathyroidism of renal origin: Secondary | ICD-10-CM | POA: Diagnosis not present

## 2021-06-15 DIAGNOSIS — N2581 Secondary hyperparathyroidism of renal origin: Secondary | ICD-10-CM | POA: Diagnosis not present

## 2021-06-15 DIAGNOSIS — D631 Anemia in chronic kidney disease: Secondary | ICD-10-CM | POA: Diagnosis not present

## 2021-06-15 DIAGNOSIS — Z992 Dependence on renal dialysis: Secondary | ICD-10-CM | POA: Diagnosis not present

## 2021-06-15 DIAGNOSIS — N186 End stage renal disease: Secondary | ICD-10-CM | POA: Diagnosis not present

## 2021-06-17 ENCOUNTER — Telehealth: Payer: Self-pay | Admitting: Family Medicine

## 2021-06-17 DIAGNOSIS — D631 Anemia in chronic kidney disease: Secondary | ICD-10-CM | POA: Diagnosis not present

## 2021-06-17 DIAGNOSIS — Z992 Dependence on renal dialysis: Secondary | ICD-10-CM | POA: Diagnosis not present

## 2021-06-17 DIAGNOSIS — N2581 Secondary hyperparathyroidism of renal origin: Secondary | ICD-10-CM | POA: Diagnosis not present

## 2021-06-17 DIAGNOSIS — N186 End stage renal disease: Secondary | ICD-10-CM | POA: Diagnosis not present

## 2021-06-17 NOTE — Chronic Care Management (AMB) (Signed)
  Chronic Care Management   Note  06/17/2021 Name: [name] MRN: [id number] DOB: January 27, 1946  [name] is a 75 y.o. year old female who is a primary care patient of [name], [name], MD. I reached out to [name] by phone today in response to a referral sent by [name] PCP, [name], [name], MD.   [name] was given information about Chronic Care Management services today including:  CCM service includes personalized support from designated clinical staff supervised by her physician, including individualized plan of care and coordination with other care providers 24/7 contact phone numbers for assistance for urgent and routine care needs. Service will only be billed when office clinical staff spend 20 minutes or more in a month to coordinate care. Only one practitioner may furnish and bill the service in a calendar month. The patient may stop CCM services at any time (effective at the end of the month) by phone call to the office staff.   Patient agreed to services and verbal consent obtained.   Follow up plan:   [place]ana Secretary/administrator

## 2021-06-19 DIAGNOSIS — N186 End stage renal disease: Secondary | ICD-10-CM | POA: Diagnosis not present

## 2021-06-19 DIAGNOSIS — Z992 Dependence on renal dialysis: Secondary | ICD-10-CM | POA: Diagnosis not present

## 2021-06-19 DIAGNOSIS — D631 Anemia in chronic kidney disease: Secondary | ICD-10-CM | POA: Diagnosis not present

## 2021-06-19 DIAGNOSIS — N2581 Secondary hyperparathyroidism of renal origin: Secondary | ICD-10-CM | POA: Diagnosis not present

## 2021-06-20 DIAGNOSIS — Z992 Dependence on renal dialysis: Secondary | ICD-10-CM | POA: Diagnosis not present

## 2021-06-20 DIAGNOSIS — I158 Other secondary hypertension: Secondary | ICD-10-CM | POA: Diagnosis not present

## 2021-06-20 DIAGNOSIS — N186 End stage renal disease: Secondary | ICD-10-CM | POA: Diagnosis not present

## 2021-06-21 DIAGNOSIS — N186 End stage renal disease: Secondary | ICD-10-CM | POA: Diagnosis not present

## 2021-06-21 DIAGNOSIS — N2581 Secondary hyperparathyroidism of renal origin: Secondary | ICD-10-CM | POA: Diagnosis not present

## 2021-06-21 DIAGNOSIS — Z992 Dependence on renal dialysis: Secondary | ICD-10-CM | POA: Diagnosis not present

## 2021-06-21 DIAGNOSIS — D631 Anemia in chronic kidney disease: Secondary | ICD-10-CM | POA: Diagnosis not present

## 2021-06-24 DIAGNOSIS — D631 Anemia in chronic kidney disease: Secondary | ICD-10-CM | POA: Diagnosis not present

## 2021-06-24 DIAGNOSIS — I10 Essential (primary) hypertension: Secondary | ICD-10-CM | POA: Diagnosis not present

## 2021-06-24 DIAGNOSIS — N186 End stage renal disease: Secondary | ICD-10-CM | POA: Diagnosis not present

## 2021-06-24 DIAGNOSIS — Z992 Dependence on renal dialysis: Secondary | ICD-10-CM | POA: Diagnosis not present

## 2021-06-24 DIAGNOSIS — N2581 Secondary hyperparathyroidism of renal origin: Secondary | ICD-10-CM | POA: Diagnosis not present

## 2021-06-25 ENCOUNTER — Ambulatory Visit: Payer: Medicare Other | Admitting: Pharmacist

## 2021-06-25 ENCOUNTER — Other Ambulatory Visit: Payer: Self-pay

## 2021-06-25 DIAGNOSIS — I480 Paroxysmal atrial fibrillation: Secondary | ICD-10-CM

## 2021-06-25 DIAGNOSIS — Z7901 Long term (current) use of anticoagulants: Secondary | ICD-10-CM

## 2021-06-25 DIAGNOSIS — Z5181 Encounter for therapeutic drug level monitoring: Secondary | ICD-10-CM | POA: Diagnosis not present

## 2021-06-25 LAB — POCT INR: INR: 1.4 — AB (ref 2.0–3.0)

## 2021-06-25 NOTE — Progress Notes (Signed)
Anticoagulation Management Jane [name] is a 75 y.o. female who reports to the clinic for monitoring of warfarin treatment.    Indication: atrial fibrillation CHA2DS2 Vasc Score 4 (Age >47, female, HTN hx), HAS-BLED 2 (Age>65, renal disease)  Duration: indefinite Supervising physician: Adrian Prows  Anticoagulation Clinic Visit History:  Patient does not report signs/symptoms of bleeding or thromboembolism.  Reports no changes in diet, medications, or lifestyle.   S/p recent EGD on 05/31/21. EGD note states normal esophagus and no noted abnormalities that would explain's pt's dysphagia. Esophagus dilated again. Multiple gastic polyps noted that were biopsied. Pathology reports showed no concerns of recurrent precancerous polyps. Hyperplastic polyps noted in the stomach. Recommended to continue monitoring and getting repeat colonoscopy in 1 year. Denies any current episodes of gastric pain, melena or hematochezia concerns.   Patient continues to experience complains of dysphagia despite recent esophageal dilation. Reports that she has trouble swallowing her food when she eats. Denies any concerns with ensuring that she is able to tolerate oral intake of her medications currently. Reports that she is waiting to follow up with her GI provider to get pathology results and address her symptoms. Next scheduled GI OV on 08/07/21.   Pt currently on amiodarone 100 mg qday for now  Amlodipine stopped per nephrology due to increased leg swelling. Current antihypertensive consists of hydralazine 25 mg BID, isordil 30 mg BID, carvedilol 12.5 mg BID. Marland Kitchen Pt reports that she is scheduled to get steroidal injection this Thursday. Pt also notes that she missed her dose 2 days ago.   Anticoagulation Episode Summary     Current INR goal:  2.0-3.0  TTR:  64.1 % (2.7 y)  Next INR check:  07/04/2021  INR from last check:  1.4 (06/25/2021)  Weekly max warfarin dose:    Target end date:  Indefinite  INR check  location:    Preferred lab:    Send INR reminders to:     Indications   Paroxysmal atrial fibrillation (HCC) [I48.0] Monitoring for long-term anticoagulant use [Z51.81 Z79.01]        Comments:           Allergies  Allergen Reactions   Latex Rash   Penicillins Other (See Comments)    Yeast infection / Childhood   Sulfa Antibiotics Rash   Tape Other (See Comments)    Plastic, silicone, and paper tape causes bruising and pulls off skin. Cloth tape works fine    Current Outpatient Medications:    amiodarone (PACERONE) 200 MG tablet, Take 1 tablet (200 mg total) by mouth daily. (Patient taking differently: Take 100 mg by mouth daily.), Disp: 90 tablet, Rfl: 1   carvedilol (COREG) 25 MG tablet, Take 1.5 tablets (37.5 mg total) by mouth 2 (two) times daily with a meal. (Patient taking differently: Take 12.5 mg by mouth 2 (two) times daily with a meal.), Disp: 240 tablet, Rfl: 3   diltiazem (CARDIZEM) 60 MG tablet, Take 1 tablet (60 mg total) by mouth 3 (three) times daily as needed (Atrial fibrillation)., Disp: 30 tablet, Rfl: 2   hydrALAZINE (APRESOLINE) 25 MG tablet, Take 1 tablet (25 mg total) by mouth 3 (three) times daily. (Patient taking differently: Take 25 mg by mouth in the morning and at bedtime.), Disp: 270 tablet, Rfl: 3   isosorbide dinitrate (ISORDIL) 30 MG tablet, Take 1 tablet (30 mg total) by mouth 3 (three) times daily. (Patient taking differently: Take 30 mg by mouth 2 (two) times daily.), Disp: 270 tablet, Rfl:  3   warfarin (COUMADIN) 5 MG tablet, USE AS DIRECTED BY COUMADIN CLINIC (Patient taking differently: Take 2.5-5 mg by mouth See admin instructions. Currently on: 5 mg every Mon, Wed; 2.5 mg all other days), Disp: 113 tablet, Rfl: 5   B Complex-C-Zn-Folic Acid (DIALYVITE 413 WITH ZINC) 0.8 MG TABS, Take 1 tablet by mouth daily., Disp: , Rfl:    CALCITRIOL PO, Take by mouth 3 (three) times a week., Disp: , Rfl:    DULoxetine (CYMBALTA) 20 MG capsule, Take 2  capsules (40 mg total) by mouth daily. (Patient taking differently: Take 20 mg by mouth daily.), Disp: 180 capsule, Rfl: 2   ethyl chloride spray, Apply 1 application topically every Monday, Wednesday, and Friday with hemodialysis., Disp: , Rfl: 12   LINZESS 145 MCG CAPS capsule, TAKE 1 CAPSULE BY MOUTH ONCE DAILY BEFORE BREAKFAST, Disp: 30 capsule, Rfl: 2   pantoprazole (PROTONIX) 40 MG tablet, Take 1 tablet (40 mg total) by mouth 2 (two) times daily., Disp: 90 tablet, Rfl: 3 Past Medical History:  Diagnosis Date   Acid reflux    Anemia of chronic disease    Arthritis    Asthma    Atrial fibrillation (HCC)    Bilateral carotid bruits    Complication of anesthesia    "hard to wake up, I have sleep apnea" no CPAP   Diverticulitis    Duodenal ulcer    Dysrhythmia    Afib   ESRD (end stage renal disease) ([place])    MWF [place]   Headache    Heart murmur    History of blood transfusion    Hypertension    Malaise and fatigue    Orthostatic hypotension    Shortness of breath    " when I walk to fast"   Sleep apnea    Syncope    Tubulovillous adenoma of colon    ASSESSMENT  Recent Results: The most recent result is correlated with 32.5 mg per week:  Lab Results  Component Value Date   INR 1.4 (A) 06/25/2021   INR 2.4 06/11/2021   INR 1.4 (A) 06/06/2021    Anticoagulation Dosing: Description   INR below goal. Take 7.5 mg today and then continue current weekly dose of 5 mg every Mon, Wed, Fri and 2.5 mg all other days. Recheck INR in 1 week.       INR today: Subtherapeutic. Recent labile INR. INR below goal in setting of possible missed dose. Pt was previously stable and controlled and stable on current weekly dose. Pt reports improved appetite and reports to staying consistent with her Vit K intake. Denies any other missed or skipped doses. Pt currently on intermittent dialysis and Lovenox injections not recommended. Pt denies any complains of stroke/TIA like symptoms.  Pt is scheduled for steroid injection in 2 days which would also result in increased anticoagulation effect. Unsure if pt's amiodarone dose was changed recently, but reports that she has been taking 1/2 tab of amiodarone 200 mg daily for the past several months. Denies any recent changes in amiodarone dose. Denies any other relevant changes in diet, medications, or lifestyle. Will boost today and follow up with pt closely. If INR continues to be subtherapeutic, will consider increasing weekly dose at next INR check.  PLAN Weekly dose was unchanged by 0% to 25 mg/week. Take 7.5 mg today, then continue current weekly dose of 5 mg every Mon, Wed, Fri and 2.5 mg all other days. Recheck INR in 10 days.  Patient Instructions  INR below goal. Take 7.5 mg today and then continue current weekly dose of 5 mg every Mon, Wed, Fri and 2.5 mg all other days. Recheck INR in 1 week.  Patient advised to contact clinic or seek medical attention if signs/symptoms of bleeding or thromboembolism occur.  Patient verbalized understanding by repeating back information and was advised to contact me if further medication-related questions arise.   Follow-up Return in about 9 days (around 07/04/2021).  [name], PharmD  15 minutes spent face-to-face with the patient during the encounter. 50% of time spent on education, including signs/sx bleeding and clotting, as well as food and drug interactions with warfarin. 50% of time was spent on fingerprick POC INR sample collection,processing, results determination, and documentation

## 2021-06-25 NOTE — Patient Instructions (Signed)
INR below goal. Take 7.5 mg today and then continue current weekly dose of 5 mg every Mon, Wed, Fri and 2.5 mg all other days. Recheck INR in 1 week.

## 2021-06-26 DIAGNOSIS — Z992 Dependence on renal dialysis: Secondary | ICD-10-CM | POA: Diagnosis not present

## 2021-06-26 DIAGNOSIS — N186 End stage renal disease: Secondary | ICD-10-CM | POA: Diagnosis not present

## 2021-06-26 DIAGNOSIS — N2581 Secondary hyperparathyroidism of renal origin: Secondary | ICD-10-CM | POA: Diagnosis not present

## 2021-06-26 DIAGNOSIS — D631 Anemia in chronic kidney disease: Secondary | ICD-10-CM | POA: Diagnosis not present

## 2021-06-27 ENCOUNTER — Encounter: Payer: Medicare Other | Attending: Physical Medicine & Rehabilitation | Admitting: Physical Medicine & Rehabilitation

## 2021-06-27 ENCOUNTER — Encounter: Payer: Self-pay | Admitting: Physical Medicine & Rehabilitation

## 2021-06-27 ENCOUNTER — Other Ambulatory Visit: Payer: Self-pay

## 2021-06-27 VITALS — BP 126/68 | HR 64 | Temp 98.3°F | Ht 65.0 in | Wt 179.0 lb

## 2021-06-27 DIAGNOSIS — G8929 Other chronic pain: Secondary | ICD-10-CM | POA: Insufficient documentation

## 2021-06-27 DIAGNOSIS — M47816 Spondylosis without myelopathy or radiculopathy, lumbar region: Secondary | ICD-10-CM | POA: Diagnosis not present

## 2021-06-27 DIAGNOSIS — I6523 Occlusion and stenosis of bilateral carotid arteries: Secondary | ICD-10-CM | POA: Diagnosis not present

## 2021-06-27 DIAGNOSIS — M533 Sacrococcygeal disorders, not elsewhere classified: Secondary | ICD-10-CM | POA: Insufficient documentation

## 2021-06-27 NOTE — Progress Notes (Signed)
Subjective:    [name], female    DOB: August 10, 1945, 75 y.o.   MRN: [id number] She has tried heat and ice as well as TENS unit.  She uses 3 Tylenol per day. Exercise no regular exercise Has been on hydrocodone and duloxetine Hx ESRD Left TKR  Right knee "needs to be replaced  Hx L4-5 fusion Has been seen for left posterior tibial tendinitis by podiatry. Tried boot and splint for Left foot and ankle Tried PT for foot and ankle "made it worse" HPI 75 year old female with chronic low back pain.  She has pain both above and below her fusion.  At initial visit patient complained that the pain below the fusion was more significant.  Now she feels the pain above the fusion is more significant. Left SI injection 05/07/2021, ~50% pain which has persisted RIght knee pain but no hx of surgery  Tried PT for back   MRI LUMBAR SPINE WITHOUT CONTRAST   TECHNIQUE: Multiplanar, multisequence MR imaging of the lumbar spine was performed. No intravenous contrast was administered.   COMPARISON:  12/20/2016   FINDINGS: Segmentation:  Standard.   Alignment:  Mild dextroscoliosis.   Vertebrae: Left eccentric endplate edema at Q2-5, degenerative/discogenic appearing. No fracture deformity. No aggressive bone lesion. L4-5 PLIF.   Conus medullaris and cauda equina: Conus extends to the L1 level. Conus and cauda equina appear normal.   Paraspinal and other soft tissues: Atrophic, polycystic kidneys.   Disc levels:   T12- L1: Rightward disc bulging and facet spurring.   L1-L2: Mild disc narrowing and bulging   L2-L3: Rightward disc bulging and facet spurring.   L3-L4: Leftward disc collapse and bulging. Small right foraminal protrusion. Left facet spurring. Left foraminal and subarticular recess impingement   L4-L5: PLIF.  Patent foramina.  Mild residual spinal stenosis   L5-S1:Asymmetric rightward disc narrowing and ridging with facet spurring. Right foraminal  impingement.   IMPRESSION: 1. Degenerative foraminal impingement on the left at L3-4 and right at L5-S1. 2. Left subarticular recess stenosis at L3-4.  Pain Inventory Average Pain 9 Pain Right Now 7 My pain is intermittent, sharp, and stabbing  In the last 24 hours, has pain interfered with the following? General activity 5 Relation with others 10 Enjoyment of life 0 What TIME of day is your pain at its worst? morning , daytime, evening, and night Sleep (in general) Poor  Pain is worse with: walking, bending, sitting, standing, and in bed on back Pain improves with: injections Relief from Meds:  not taking oral medication  Family History  Problem Relation Age of Onset   Heart failure Mother    Stroke Mother    Other Father    Colon cancer Neg Hx    Liver disease Neg Hx    Esophageal cancer Neg Hx    Stomach cancer Neg Hx    Inflammatory bowel disease Neg Hx    Rectal cancer Neg Hx    Pancreatic cancer Neg Hx    Social History   Socioeconomic History   Marital status: Divorced    Spouse name: Not on file   Number of children: 4   Years of education: 14   Highest education level: Associate degree: occupational, Hotel manager, or vocational program  Occupational History   Occupation: Retired  Tobacco Use   Smoking status: Never   Smokeless tobacco: Never  Vaping Use   Vaping Use: Never used  Substance and Sexual Activity   Alcohol use: No  Drug use: No   Sexual activity: Never  Other Topics Concern   Not on file  Social History Narrative   HH 1   Divorced   Outpatient dialysis Mon, Wed, Fri   4 children: 1 daughter locally is an Designer, multimedia and 3 sons in [place] Determinants of Health   Financial Resource Strain: Low Risk    Difficulty of Paying Living Expenses: Not hard at all  Food Insecurity: No Food Insecurity   Worried About Charity fundraiser in the Last Year: Never true   Arboriculturist in the Last Year: Never true  Transportation  Needs: No Transportation Needs   Lack of Transportation (Medical): No   Lack of Transportation (Non-Medical): No  Physical Activity: Inactive   Days of Exercise per Week: 0 days   Minutes of Exercise per Session: 0 min  Stress: Stress Concern Present   Feeling of Stress : To some extent  Social Connections: Moderately Isolated   Frequency of Communication with Friends and Family: More than three times a week   Frequency of Social Gatherings with Friends and Family: Never   Attends Religious Services: More than 4 times per year   Active Member of Clubs or Organizations: No   Attends Archivist Meetings: Never   Marital Status: Divorced   Past Surgical History:  Procedure Laterality Date   A/V FISTULAGRAM Right 10/18/2020   Procedure: A/V FISTULAGRAM;  Surgeon: Marty Heck, MD;  Location: [place] [facility];  Service: Cardiovascular;  Laterality: Right;   AV FISTULA PLACEMENT     BACK SURGERY     Lumbar fusion L 4 and L 5   BIOPSY  01/09/2020   Procedure: BIOPSY;  Surgeon: Rush Landmark Telford Nab., MD;  Location: [place];  Service: Gastroenterology;;   BIOPSY  01/31/2021   Procedure: BIOPSY;  Surgeon: Irving Copas., MD;  Location: [place];  Service: Gastroenterology;;   COLONOSCOPY     COLONOSCOPY N/A 02/01/2021   Procedure: COLONOSCOPY;  Surgeon: Yetta Flock, MD;  Location: [place];  Service: Gastroenterology;  Laterality: N/A;   ENDOSCOPIC MUCOSAL RESECTION N/A 01/09/2020   Procedure: ENDOSCOPIC MUCOSAL RESECTION;  Surgeon: Rush Landmark Telford Nab., MD;  Location: [place];  Service: Gastroenterology;  Laterality: N/A;   ENDOSCOPIC MUCOSAL RESECTION N/A 01/31/2021   Procedure: ENDOSCOPIC MUCOSAL RESECTION;  Surgeon: Rush Landmark Telford Nab., MD;  Location: [place];  Service: Gastroenterology;  Laterality: N/A;   ESOPHAGOGASTRODUODENOSCOPY     ESOPHAGOGASTRODUODENOSCOPY (EGD) WITH PROPOFOL N/A 01/09/2020   Procedure:  ESOPHAGOGASTRODUODENOSCOPY (EGD) WITH PROPOFOL;  Surgeon: Rush Landmark Telford Nab., MD;  Location: [place];  Service: Gastroenterology;  Laterality: N/A;   ESOPHAGOGASTRODUODENOSCOPY (EGD) WITH PROPOFOL N/A 01/13/2020   Procedure: ESOPHAGOGASTRODUODENOSCOPY (EGD) WITH PROPOFOL;  Surgeon: Irene Shipper, MD;  Location: [place];  Service: Gastroenterology;  Laterality: N/A;   ESOPHAGOGASTRODUODENOSCOPY (EGD) WITH PROPOFOL N/A 01/31/2021   Procedure: ESOPHAGOGASTRODUODENOSCOPY (EGD) WITH PROPOFOL;  Surgeon: Rush Landmark Telford Nab., MD;  Location: [place];  Service: Gastroenterology;  Laterality: N/A;   EUS N/A 01/09/2020   Procedure: UPPER ENDOSCOPIC ULTRASOUND (EUS) RADIAL;  Surgeon: Irving Copas., MD;  Location: [place];  Service: Gastroenterology;  Laterality: N/A;   HEMOSTASIS CLIP PLACEMENT  01/09/2020   Procedure: HEMOSTASIS CLIP PLACEMENT;  Surgeon: Irving Copas., MD;  Location: [place];  Service: Gastroenterology;;   HEMOSTASIS CLIP PLACEMENT  01/31/2021   Procedure: HEMOSTASIS CLIP PLACEMENT;  Surgeon: Irving Copas., MD;  Location: [place];  Service: Gastroenterology;;   HEMOSTASIS CONTROL  01/13/2020   Procedure: HEMOSTASIS CONTROL;  Surgeon: Irene Shipper, MD;  Location: [facility] ENDOSCOPY;  Service: Gastroenterology;;  hemaspray   HOT HEMOSTASIS N/A 01/13/2020   Procedure: HOT HEMOSTASIS (ARGON PLASMA COAGULATION/BICAP);  Surgeon: Irene Shipper, MD;  Location: [place];  Service: Gastroenterology;  Laterality: N/A;   HOT HEMOSTASIS N/A 02/01/2021   Procedure: HOT HEMOSTASIS (ARGON PLASMA COAGULATION/BICAP);  Surgeon: Yetta Flock, MD;  Location: [facility];  Service: Gastroenterology;  Laterality: N/A;   IR GENERIC HISTORICAL  07/09/2016   IR US GUIDE VASC ACCESS RIGHT 07/09/2016 Ar[facility] [place], MD [facility]   IR GENERIC HISTORICAL  07/09/2016   IR FLUORO GUIDE CV LINE RIGHT 07/09/2016 Arne [place], MD [facility]   KNEE  ARTHROPLASTY Left    LAPAROSCOPIC SIGMOID COLECTOMY N/A 07/11/2016   Procedure: LAPAROSCOPIC SIGMOID COLECTOMY;  Surgeon: Clovis Riley, MD;  Location: [place];  Service: General;  Laterality: N/A;   MASS EXCISION Right 04/10/2020   Procedure: EXCISION SKIN NODULE RIGHT FOREARM;  Surgeon: Waynetta Sandy, MD;  Location: [place];  Service: Vascular;  Laterality: Right;   SCLEROTHERAPY  01/09/2020   Procedure: Clide Deutscher;  Surgeon: Mansouraty, Telford Nab., MD;  Location: [place];  Service: Gastroenterology;;   Clide Deutscher  01/13/2020   Procedure: Clide Deutscher;  Surgeon: Irene Shipper, MD;  Location: [place];  Service: Gastroenterology;;   Maryagnes Amos INJECTION  01/09/2020   Procedure: SUBMUCOSAL LIFTING INJECTION;  Surgeon: Irving Copas., MD;  Location: [place];  Service: Gastroenterology;;   TUBAL LIGATION     Past Surgical History:  Procedure Laterality Date   A/V FISTULAGRAM Right 10/18/2020   Procedure: A/V [id number];  Surgeon: Marty Heck, MD;  Location: [place] [facility];  Service: Cardiovascular;  Laterality: Right;   AV FISTULA PLACEMENT     BACK SURGERY     Lumbar fusion L 4 and L 5   BIOPSY  01/09/2020   Procedure: BIOPSY;  Surgeon: Rush Landmark Telford Nab., MD;  Location: [place];  Service: Gastroenterology;;   BIOPSY  01/31/2021   Procedure: BIOPSY;  Surgeon: Irving Copas., MD;  Location: [place];  Service: Gastroenterology;;   COLONOSCOPY     COLONOSCOPY N/A 02/01/2021   Procedure: COLONOSCOPY;  Surgeon: Yetta Flock, MD;  Location: [place];  Service: Gastroenterology;  Laterality: N/A;   ENDOSCOPIC MUCOSAL RESECTION N/A 01/09/2020   Procedure: ENDOSCOPIC MUCOSAL RESECTION;  Surgeon: Rush Landmark Telford Nab., MD;  Location: [place];  Service: Gastroenterology;  Laterality: N/A;   ENDOSCOPIC MUCOSAL RESECTION N/A 01/31/2021   Procedure: ENDOSCOPIC MUCOSAL RESECTION;  Surgeon: Rush Landmark  Telford Nab., MD;  Location: [place];  Service: Gastroenterology;  Laterality: N/A;   ESOPHAGOGASTRODUODENOSCOPY     ESOPHAGOGASTRODUODENOSCOPY (EGD) WITH PROPOFOL N/A 01/09/2020   Procedure: ESOPHAGOGASTRODUODENOSCOPY (EGD) WITH PROPOFOL;  Surgeon: Rush Landmark Telford Nab., MD;  Location: [place];  Service: Gastroenterology;  Laterality: N/A;   ESOPHAGOGASTRODUODENOSCOPY (EGD) WITH PROPOFOL N/A 01/13/2020   Procedure: ESOPHAGOGASTRODUODENOSCOPY (EGD) WITH PROPOFOL;  Surgeon: Irene Shipper, MD;  Location: [place];  Service: Gastroenterology;  Laterality: N/A;   ESOPHAGOGASTRODUODENOSCOPY (EGD) WITH PROPOFOL N/A 01/31/2021   Procedure: ESOPHAGOGASTRODUODENOSCOPY (EGD) WITH PROPOFOL;  Surgeon: Rush Landmark Telford Nab., MD;  Location: [place];  Service: Gastroenterology;  Laterality: N/A;   EUS N/A 01/09/2020   Procedure: UPPER ENDOSCOPIC ULTRASOUND (EUS) RADIAL;  Surgeon: Irving Copas., MD;  Location: [place];  Service: Gastroenterology;  Laterality: N/A;   HEMOSTASIS CLIP PLACEMENT  01/09/2020  Procedure: HEMOSTASIS CLIP PLACEMENT;  Surgeon: Irving Copas., MD;  Location: [place];  Service: Gastroenterology;;   HEMOSTASIS CLIP PLACEMENT  01/31/2021   Procedure: HEMOSTASIS CLIP PLACEMENT;  Surgeon: Irving Copas., MD;  Location: [place];  Service: Gastroenterology;;   HEMOSTASIS CONTROL  01/13/2020   Procedure: HEMOSTASIS CONTROL;  Surgeon: Irene Shipper, MD;  Location: [facility];  Service: Gastroenterology;;  hemaspray   HOT HEMOSTASIS N/A 01/13/2020   Procedure: HOT HEMOSTASIS (ARGON PLASMA COAGULATION/BICAP);  Surgeon: Irene Shipper, MD;  Location: [place];  Service: Gastroenterology;  Laterality: N/A;   HOT HEMOSTASIS N/A 02/01/2021   Procedure: HOT HEMOSTASIS (ARGON PLASMA COAGULATION/BICAP);  Surgeon: Yetta Flock, MD;  Location: [facility] ENDOSCOPY;  Service: Gastroenterology;  Laterality: N/A;   IR GENERIC HISTORICAL  07/09/2016    IR US GUIDE VASC ACCESS RIGHT 07/09/2016 [facility] [place], MD [facility]   IR GENERIC HISTORICAL  07/09/2016   IR FLUORO GUIDE CV LINE RIGHT 07/09/2016 [facility] [place], MD [facility]   KNEE ARTHROPLASTY Left    LAPAROSCOPIC SIGMOID COLECTOMY N/A 07/11/2016   Procedure: LAPAROSCOPIC SIGMOID COLECTOMY;  Surgeon: Clovis Riley, MD;  Location: [place];  Service: General;  Laterality: N/A;   MASS EXCISION Right 04/10/2020   Procedure: EXCISION SKIN NODULE RIGHT FOREARM;  Surgeon: Waynetta Sandy, MD;  Location: [place];  Service: Vascular;  Laterality: Right;   SCLEROTHERAPY  01/09/2020   Procedure: Clide Deutscher;  Surgeon: Irving Copas., MD;  Location: [place];  Service: Gastroenterology;;   Clide Deutscher  01/13/2020   Procedure: Clide Deutscher;  Surgeon: Irene Shipper, MD;  Location: [facility] ENDOSCOPY;  Service: Gastroenterology;;   Maryagnes Amos INJECTION  01/09/2020   Procedure: SUBMUCOSAL LIFTING INJECTION;  Surgeon: Irving Copas., MD;  Location: [facility] ENDOSCOPY;  Service: Gastroenterology;;   TUBAL LIGATION     Past Medical History:  Diagnosis Date   Acid reflux    Anemia of chronic disease    Arthritis    Asthma    Atrial fibrillation ([place])    Bilateral carotid bruits    Complication of anesthesia    "hard to wake up, I have sleep apnea" no CPAP   Diverticulitis    Duodenal ulcer    Dysrhythmia    Afib   ESRD (end stage renal disease) ([place])    MWF [place]   Headache    Heart murmur    History of blood transfusion    Hypertension    Malaise and fatigue    Orthostatic hypotension    Shortness of breath    " when I walk to fast"   Sleep apnea    Syncope    Tubulovillous adenoma of colon    Ht 5\' 5"  (1.651 m)   Wt 179 lb (81.2 kg)   BMI 29.79 kg/m   Opioid Risk Score:   Fall Risk Score:  `1  Depression screen PHQ 2/9  Depression screen [facility] 2/9 06/27/2021 05/07/2021 02/28/2021 10/23/2020 07/31/2020 03/27/2020 03/27/2020  Decreased  Interest 1 0 1 0 0 1 1  Down, Depressed, Hopeless 1 0 0 0 1 0 0  PHQ - 2 Score 2 0 1 0 1 1 1   Altered sleeping - - 0 2 - 3 3  Tired, decreased energy - - 0 1 - 3 3  Change in appetite - - 0 1 - 1 -  Feeling bad or failure about yourself  - - 0 0 - 0 -  Trouble concentrating - - 1 0 - 0 -  Moving slowly or fidgety/restless - - 0 0 - 0 -  Suicidal thoughts - - 0 0 - 0 -  PHQ-9 Score - - 2 4 - 8 7  Difficult doing work/chores - - - Not difficult at all - Not difficult at all -  Some recent data might be hidden    Review of Systems  Genitourinary:  Positive for pelvic pain.  Musculoskeletal:  Positive for back pain.       Pain down right leg, right back, right buttock  All other systems reviewed and are negative.     Objective:   Physical Exam Vitals and nursing note reviewed.  Constitutional:      Appearance: She is obese.  HENT:     Head: Normocephalic and atraumatic.  Eyes:     Extraocular Movements: Extraocular movements intact.     Conjunctiva/sclera: Conjunctivae normal.     Pupils: Pupils are equal, round, and reactive to light.  Musculoskeletal:        General: Normal range of motion.     Comments: Lumbar flexion is 75% of normal lumbar extension is 25% of normal There is more pain with extension and with flexion.  There is pain to palpation from L1-L4 in the paraspinal area.  Skin:    General: Skin is warm and dry.  Neurological:     Mental Status: She is alert and oriented to person, place, and time.     Comments: Motor strength is 5/5 bilateral hip flexor knee extensor ankle dorsiflexor Sensation intact to light touch proximal bilateral lower extremities  Negative straight leg raising   Psychiatric:        Mood and Affect: Mood normal.        Behavior: Behavior normal.          Assessment & Plan:   1.  Lumbar postlaminectomy pain her pain is mainly axial no focal neurologic deficits.  No clear-cut signs of neurogenic claudication. 2.  Sacroiliac pain  left side confirmed with sacroiliac injection still has good relief 3.  Pain above the fusion likely lumbar facets at L1-L2 L2-L3 and  L3-L4, will do medial branch blocks at these levels.  That would be the T12 L1-L2-L3 medial branches  We discussed injections as well as spine anatomy using spine model with the patient and discussed this with her daughter as well.  We went over treatment options as well as the pros and cons of medication management.  She had tried tramadol in the past and did not feel like it was very helpful so she is off medications at this time.  We emphasized trying to keep mobile.  She is using a heating pad.

## 2021-06-28 ENCOUNTER — Telehealth: Payer: Self-pay

## 2021-06-28 DIAGNOSIS — N186 End stage renal disease: Secondary | ICD-10-CM | POA: Diagnosis not present

## 2021-06-28 DIAGNOSIS — D631 Anemia in chronic kidney disease: Secondary | ICD-10-CM | POA: Diagnosis not present

## 2021-06-28 DIAGNOSIS — N2581 Secondary hyperparathyroidism of renal origin: Secondary | ICD-10-CM | POA: Diagnosis not present

## 2021-06-28 DIAGNOSIS — Z992 Dependence on renal dialysis: Secondary | ICD-10-CM | POA: Diagnosis not present

## 2021-06-28 NOTE — Telephone Encounter (Signed)
Patient notified

## 2021-06-28 NOTE — Telephone Encounter (Signed)
Mrs. Vandruff has a Medial Branch Block scheduled on 08/20/2020.    1. Will she need to hold her Warfarin?  2.  If so how may days.

## 2021-07-01 DIAGNOSIS — N2581 Secondary hyperparathyroidism of renal origin: Secondary | ICD-10-CM | POA: Diagnosis not present

## 2021-07-01 DIAGNOSIS — Z992 Dependence on renal dialysis: Secondary | ICD-10-CM | POA: Diagnosis not present

## 2021-07-01 DIAGNOSIS — D631 Anemia in chronic kidney disease: Secondary | ICD-10-CM | POA: Diagnosis not present

## 2021-07-01 DIAGNOSIS — N186 End stage renal disease: Secondary | ICD-10-CM | POA: Diagnosis not present

## 2021-07-03 DIAGNOSIS — D631 Anemia in chronic kidney disease: Secondary | ICD-10-CM | POA: Diagnosis not present

## 2021-07-03 DIAGNOSIS — Z992 Dependence on renal dialysis: Secondary | ICD-10-CM | POA: Diagnosis not present

## 2021-07-03 DIAGNOSIS — N186 End stage renal disease: Secondary | ICD-10-CM | POA: Diagnosis not present

## 2021-07-03 DIAGNOSIS — N2581 Secondary hyperparathyroidism of renal origin: Secondary | ICD-10-CM | POA: Diagnosis not present

## 2021-07-03 DIAGNOSIS — I4891 Unspecified atrial fibrillation: Secondary | ICD-10-CM | POA: Diagnosis not present

## 2021-07-04 ENCOUNTER — Ambulatory Visit: Payer: Medicare Other | Admitting: Pharmacist

## 2021-07-04 ENCOUNTER — Other Ambulatory Visit: Payer: Self-pay

## 2021-07-04 DIAGNOSIS — Z5181 Encounter for therapeutic drug level monitoring: Secondary | ICD-10-CM

## 2021-07-04 DIAGNOSIS — Z7901 Long term (current) use of anticoagulants: Secondary | ICD-10-CM | POA: Diagnosis not present

## 2021-07-04 DIAGNOSIS — I480 Paroxysmal atrial fibrillation: Secondary | ICD-10-CM

## 2021-07-04 LAB — POCT INR: INR: 1.8 — AB (ref 2.0–3.0)

## 2021-07-04 NOTE — Patient Instructions (Signed)
INR below goal. Increase weekly dose to 2.5 mg every Sun, Tues, Thurs and 5 mg all other days. Recheck INR in 2 weeks.

## 2021-07-04 NOTE — Progress Notes (Signed)
Anticoagulation Management Jane [name] is a 75 y.o. female who reports to the clinic for monitoring of warfarin treatment.    Indication: atrial fibrillation CHA2DS2 Vasc Score 4 (Age >55, female, HTN hx), HAS-BLED 2 (Age>65, renal disease)  Duration: indefinite Supervising physician: Adrian Prows  Anticoagulation Clinic Visit History:  Patient does not report signs/symptoms of bleeding or thromboembolism.  Reports no changes in diet, medications, or lifestyle.   Patient continues to experience complains of dysphagia despite recent esophageal dilation. Reports that she has trouble swallowing her food when she eats. Denies any concerns with ensuring that she is able to tolerate oral intake of her medications currently. Reports that she is waiting to follow up with her GI provider to get pathology results and address her symptoms. Next scheduled GI OV on 08/07/21.   Pt currently on amiodarone 100 mg qday for now  Amlodipine stopped per nephrology due to increased leg swelling. Isosorbide and carvedilol held due to persistent hypotensive BP readings on remote BP monitoring.  Current antihypertensive consists of hydralazine 25 mg BID.  Pt reports that she missed a dose on Monday. Took an extra dose as directed on Tues. Pt reports that she was previously taking her warfarin in the evening and recently has been sleeping early and missing her dose.   Anticoagulation Episode Summary     Current INR goal:  2.0-3.0  TTR:  63.5 % (2.7 y)  Next INR check:  07/18/2021  INR from last check:  1.8 (07/04/2021)  Weekly max warfarin dose:    Target end date:  Indefinite  INR check location:    Preferred lab:    Send INR reminders to:     Indications   Paroxysmal atrial fibrillation (HCC) [I48.0] Monitoring for long-term anticoagulant use [Z51.81 Z79.01]        Comments:           Allergies  Allergen Reactions   Latex Rash   Penicillins Other (See Comments)    Yeast infection / Childhood    Sulfa Antibiotics Rash   Tape Other (See Comments)    Plastic, silicone, and paper tape causes bruising and pulls off skin. Cloth tape works fine    Current Outpatient Medications:    amiodarone (PACERONE) 200 MG tablet, Take 1 tablet (200 mg total) by mouth daily. (Patient taking differently: Take 100 mg by mouth daily.), Disp: 90 tablet, Rfl: 1   B Complex-C-Zn-Folic Acid (DIALYVITE 272 WITH ZINC) 0.8 MG TABS, Take 1 tablet by mouth daily., Disp: , Rfl:    CALCITRIOL PO, Take by mouth 3 (three) times a week., Disp: , Rfl:    carvedilol (COREG) 25 MG tablet, Take 1.5 tablets (37.5 mg total) by mouth 2 (two) times daily with a meal. (Patient not taking: Reported on 07/04/2021), Disp: 240 tablet, Rfl: 3   diltiazem (CARDIZEM) 60 MG tablet, Take 1 tablet (60 mg total) by mouth 3 (three) times daily as needed (Atrial fibrillation)., Disp: 30 tablet, Rfl: 2   DULoxetine (CYMBALTA) 20 MG capsule, Take 2 capsules (40 mg total) by mouth daily. (Patient not taking: Reported on 06/27/2021), Disp: 180 capsule, Rfl: 2   ethyl chloride spray, Apply 1 application topically every Monday, Wednesday, and Friday with hemodialysis., Disp: , Rfl: 12   hydrALAZINE (APRESOLINE) 25 MG tablet, Take 1 tablet (25 mg total) by mouth 3 (three) times daily. (Patient taking differently: Take 25 mg by mouth in the morning and at bedtime.), Disp: 270 tablet, Rfl: 3   isosorbide dinitrate (ISORDIL)  30 MG tablet, Take 1 tablet (30 mg total) by mouth 3 (three) times daily. (Patient not taking: Reported on 07/04/2021), Disp: 270 tablet, Rfl: 3   LINZESS 145 MCG CAPS capsule, TAKE 1 CAPSULE BY MOUTH ONCE DAILY BEFORE BREAKFAST, Disp: 30 capsule, Rfl: 2   pantoprazole (PROTONIX) 40 MG tablet, Take 1 tablet (40 mg total) by mouth 2 (two) times daily., Disp: 90 tablet, Rfl: 3   warfarin (COUMADIN) 5 MG tablet, USE AS DIRECTED BY COUMADIN CLINIC (Patient taking differently: Take 2.5-5 mg by mouth See admin instructions. Currently on: 5  mg every Mon, Wed; 2.5 mg all other days), Disp: 113 tablet, Rfl: 5 Past Medical History:  Diagnosis Date   Acid reflux    Anemia of chronic disease    Arthritis    Asthma    Atrial fibrillation (HCC)    Bilateral carotid bruits    Complication of anesthesia    "hard to wake up, I have sleep apnea" no CPAP   Diverticulitis    Duodenal ulcer    Dysrhythmia    Afib   ESRD (end stage renal disease) ([place])    MWF [place]   Headache    Heart murmur    History of blood transfusion    Hypertension    Malaise and fatigue    Orthostatic hypotension    Shortness of breath    " when I walk to fast"   Sleep apnea    Syncope    Tubulovillous adenoma of colon    ASSESSMENT  Recent Results: The most recent result is correlated with 32.5 mg per week:  Lab Results  Component Value Date   INR 1.8 (A) 07/04/2021   INR 1.4 (A) 06/25/2021   INR 2.4 06/11/2021    Anticoagulation Dosing: Description   INR below goal. Increase weekly dose to 2.5 mg every Sun, Tues, Thurs and 5 mg all other days. Recheck INR in 2 weeks.       INR today: Subtherapeutic. Recent labile INR. INR continues to remain subtherapeutic on current weekly dose. Lower than previously discussed weekly warfarin dose intake. Denies any other relevant changes in diet, medications, or lifestyle. Denies any complains of bleeding or bruising symptoms. Denies any TIA/stroke like symptoms. Will increase weekly dose in setting of persistent subtherapeutic INR readings and continue close monitoring and follow up.   PLAN Weekly dose was increased by 10% to 27.5 mg/week. Increase weekly dose to 2.5 mg every Sun, Tues, Thurs, and 5 mg all other days. Recheck INR in 2 weeks.   Patient Instructions  INR below goal. Increase weekly dose to 2.5 mg every Sun, Tues, Thurs and 5 mg all other days. Recheck INR in 2 weeks.  Patient advised to contact clinic or seek medical attention if signs/symptoms of bleeding or thromboembolism  occur.  Patient verbalized understanding by repeating back information and was advised to contact me if further medication-related questions arise.   Follow-up Return in about 2 weeks (around 07/18/2021).  [name], PharmD  15 minutes spent face-to-face with the patient during the encounter. 50% of time spent on education, including signs/sx bleeding and clotting, as well as food and drug interactions with warfarin. 50% of time was spent on fingerprick POC INR sample collection,processing, results determination, and documentation

## 2021-07-05 DIAGNOSIS — N186 End stage renal disease: Secondary | ICD-10-CM | POA: Diagnosis not present

## 2021-07-05 DIAGNOSIS — D631 Anemia in chronic kidney disease: Secondary | ICD-10-CM | POA: Diagnosis not present

## 2021-07-05 DIAGNOSIS — Z992 Dependence on renal dialysis: Secondary | ICD-10-CM | POA: Diagnosis not present

## 2021-07-05 DIAGNOSIS — N2581 Secondary hyperparathyroidism of renal origin: Secondary | ICD-10-CM | POA: Diagnosis not present

## 2021-07-06 NOTE — Progress Notes (Unsigned)
Appointment changed

## 2021-07-07 ENCOUNTER — Encounter (HOSPITAL_BASED_OUTPATIENT_CLINIC_OR_DEPARTMENT_OTHER): Payer: Medicare Other | Admitting: Pulmonary Disease

## 2021-07-08 ENCOUNTER — Encounter (HOSPITAL_BASED_OUTPATIENT_CLINIC_OR_DEPARTMENT_OTHER): Payer: Medicare Other | Admitting: Pulmonary Disease

## 2021-07-08 DIAGNOSIS — Z992 Dependence on renal dialysis: Secondary | ICD-10-CM | POA: Diagnosis not present

## 2021-07-08 DIAGNOSIS — N186 End stage renal disease: Secondary | ICD-10-CM | POA: Diagnosis not present

## 2021-07-08 DIAGNOSIS — N2581 Secondary hyperparathyroidism of renal origin: Secondary | ICD-10-CM | POA: Diagnosis not present

## 2021-07-08 DIAGNOSIS — D631 Anemia in chronic kidney disease: Secondary | ICD-10-CM | POA: Diagnosis not present

## 2021-07-10 ENCOUNTER — Telehealth: Payer: Medicare Other

## 2021-07-10 DIAGNOSIS — Z992 Dependence on renal dialysis: Secondary | ICD-10-CM | POA: Diagnosis not present

## 2021-07-10 DIAGNOSIS — D631 Anemia in chronic kidney disease: Secondary | ICD-10-CM | POA: Diagnosis not present

## 2021-07-10 DIAGNOSIS — N2581 Secondary hyperparathyroidism of renal origin: Secondary | ICD-10-CM | POA: Diagnosis not present

## 2021-07-10 DIAGNOSIS — N186 End stage renal disease: Secondary | ICD-10-CM | POA: Diagnosis not present

## 2021-07-12 DIAGNOSIS — Z992 Dependence on renal dialysis: Secondary | ICD-10-CM | POA: Diagnosis not present

## 2021-07-12 DIAGNOSIS — N2581 Secondary hyperparathyroidism of renal origin: Secondary | ICD-10-CM | POA: Diagnosis not present

## 2021-07-12 DIAGNOSIS — N186 End stage renal disease: Secondary | ICD-10-CM | POA: Diagnosis not present

## 2021-07-12 DIAGNOSIS — D631 Anemia in chronic kidney disease: Secondary | ICD-10-CM | POA: Diagnosis not present

## 2021-07-15 DIAGNOSIS — N2581 Secondary hyperparathyroidism of renal origin: Secondary | ICD-10-CM | POA: Diagnosis not present

## 2021-07-15 DIAGNOSIS — N186 End stage renal disease: Secondary | ICD-10-CM | POA: Diagnosis not present

## 2021-07-15 DIAGNOSIS — Z992 Dependence on renal dialysis: Secondary | ICD-10-CM | POA: Diagnosis not present

## 2021-07-15 DIAGNOSIS — D631 Anemia in chronic kidney disease: Secondary | ICD-10-CM | POA: Diagnosis not present

## 2021-07-17 DIAGNOSIS — Z992 Dependence on renal dialysis: Secondary | ICD-10-CM | POA: Diagnosis not present

## 2021-07-17 DIAGNOSIS — N186 End stage renal disease: Secondary | ICD-10-CM | POA: Diagnosis not present

## 2021-07-17 DIAGNOSIS — D631 Anemia in chronic kidney disease: Secondary | ICD-10-CM | POA: Diagnosis not present

## 2021-07-17 DIAGNOSIS — N2581 Secondary hyperparathyroidism of renal origin: Secondary | ICD-10-CM | POA: Diagnosis not present

## 2021-07-18 ENCOUNTER — Ambulatory Visit: Payer: Medicare Other | Admitting: Cardiology

## 2021-07-18 ENCOUNTER — Other Ambulatory Visit: Payer: Self-pay

## 2021-07-18 ENCOUNTER — Encounter: Payer: Self-pay | Admitting: Cardiology

## 2021-07-18 VITALS — BP 147/62 | HR 64 | Temp 97.9°F | Resp 17 | Ht 65.0 in | Wt 178.0 lb

## 2021-07-18 DIAGNOSIS — I951 Orthostatic hypotension: Secondary | ICD-10-CM

## 2021-07-18 DIAGNOSIS — Z992 Dependence on renal dialysis: Secondary | ICD-10-CM

## 2021-07-18 DIAGNOSIS — Z5181 Encounter for therapeutic drug level monitoring: Secondary | ICD-10-CM

## 2021-07-18 DIAGNOSIS — I480 Paroxysmal atrial fibrillation: Secondary | ICD-10-CM | POA: Diagnosis not present

## 2021-07-18 DIAGNOSIS — I6523 Occlusion and stenosis of bilateral carotid arteries: Secondary | ICD-10-CM

## 2021-07-18 DIAGNOSIS — N186 End stage renal disease: Secondary | ICD-10-CM

## 2021-07-18 DIAGNOSIS — Z7901 Long term (current) use of anticoagulants: Secondary | ICD-10-CM | POA: Diagnosis not present

## 2021-07-18 LAB — POCT INR: INR: 1.9 — AB (ref 2.0–3.0)

## 2021-07-18 MED ORDER — ATORVASTATIN CALCIUM 10 MG PO TABS: 10.0000 mg | ORAL_TABLET | Freq: Every day | ORAL | 2 refills | Status: DC

## 2021-07-18 NOTE — Progress Notes (Signed)
Primary Physician/Referring:  [name], [name], MD  [name], female    DOB: 07-29-1945, 75 y.o.   MRN: [id number]  Chief Complaint  Patient presents with   Atrial Fibrillation   Follow-up   HPI:    Jane [name]  is a 75 y.o. female with paroxysmal atrial fibrillation, on low-dose amiodarone, dose reduced due to prolonged QT and eventually discontinued, end-stage disease on hemodialysis on Monday Wednesday and Friday, orthostatic hypotension, obstructive sleep apnea not on CPAP, external hemorrhoids but tolerating Coumadin.   She presents for 17-month office visit, over the past 6 months she has lost about 80 pounds in weight due to loss of appetite, amiodarone was discontinued due to bradycardia and also weight loss.  She has been off of all antihypertensive medications.  She is having some difficulty with low blood pressure during dialysis.  Otherwise remains asymptomatic without chest pain, dyspnea or palpitations.  Past Medical History:  Diagnosis Date   Acid reflux    Anemia of chronic disease    Arthritis    Asthma    Atrial fibrillation ([place])    Bilateral carotid bruits    Complication of anesthesia    "hard to wake up, I have sleep apnea" no CPAP   Diverticulitis    Duodenal ulcer    Dysrhythmia    Afib   ESRD (end stage renal disease) ([place])    MWF [place]   Headache    Heart murmur    History of blood transfusion    Hypertension    Malaise and fatigue    Orthostatic hypotension    Shortness of breath    " when I walk to fast"   Sleep apnea    Syncope    Tubulovillous adenoma of colon    Past Surgical History:  Procedure Laterality Date   A/V FISTULAGRAM Right 10/18/2020   Procedure: A/V FISTULAGRAM;  Surgeon: Marty Heck, MD;  Location: [facility];  Service: Cardiovascular;  Laterality: Right;   AV FISTULA PLACEMENT     BACK SURGERY     Lumbar fusion L 4 and L 5   BIOPSY  01/09/2020   Procedure: BIOPSY;  Surgeon:  Rush Landmark Telford Nab., MD;  Location: [place];  Service: Gastroenterology;;   BIOPSY  01/31/2021   Procedure: BIOPSY;  Surgeon: Irving Copas., MD;  Location: [place];  Service: Gastroenterology;;   COLONOSCOPY     COLONOSCOPY N/A 02/01/2021   Procedure: COLONOSCOPY;  Surgeon: Yetta Flock, MD;  Location: [place];  Service: Gastroenterology;  Laterality: N/A;   ENDOSCOPIC MUCOSAL RESECTION N/A 01/09/2020   Procedure: ENDOSCOPIC MUCOSAL RESECTION;  Surgeon: Rush Landmark Telford Nab., MD;  Location: [place];  Service: Gastroenterology;  Laterality: N/A;   ENDOSCOPIC MUCOSAL RESECTION N/A 01/31/2021   Procedure: ENDOSCOPIC MUCOSAL RESECTION;  Surgeon: Rush Landmark Telford Nab., MD;  Location: [place];  Service: Gastroenterology;  Laterality: N/A;   ESOPHAGOGASTRODUODENOSCOPY     ESOPHAGOGASTRODUODENOSCOPY (EGD) WITH PROPOFOL N/A 01/09/2020   Procedure: ESOPHAGOGASTRODUODENOSCOPY (EGD) WITH PROPOFOL;  Surgeon: Rush Landmark Telford Nab., MD;  Location: [place];  Service: Gastroenterology;  Laterality: N/A;   ESOPHAGOGASTRODUODENOSCOPY (EGD) WITH PROPOFOL N/A 01/13/2020   Procedure: ESOPHAGOGASTRODUODENOSCOPY (EGD) WITH PROPOFOL;  Surgeon: Irene Shipper, MD;  Location: [place];  Service: Gastroenterology;  Laterality: N/A;   ESOPHAGOGASTRODUODENOSCOPY (EGD) WITH PROPOFOL N/A 01/31/2021   Procedure: ESOPHAGOGASTRODUODENOSCOPY (EGD) WITH PROPOFOL;  Surgeon: Rush Landmark Telford Nab., MD;  Location: [place];  Service: Gastroenterology;  Laterality: N/A;  EUS N/A 01/09/2020   Procedure: UPPER ENDOSCOPIC ULTRASOUND (EUS) RADIAL;  Surgeon: Irving Copas., MD;  Location: [place];  Service: Gastroenterology;  Laterality: N/A;   HEMOSTASIS CLIP PLACEMENT  01/09/2020   Procedure: HEMOSTASIS CLIP PLACEMENT;  Surgeon: Irving Copas., MD;  Location: [place];  Service: Gastroenterology;;   HEMOSTASIS CLIP PLACEMENT  01/31/2021   Procedure:  HEMOSTASIS CLIP PLACEMENT;  Surgeon: Irving Copas., MD;  Location: [place];  Service: Gastroenterology;;   HEMOSTASIS CONTROL  01/13/2020   Procedure: HEMOSTASIS CONTROL;  Surgeon: Irene Shipper, MD;  Location: [facility] ENDOSCOPY;  Service: Gastroenterology;;  hemaspray   HOT HEMOSTASIS N/A 01/13/2020   Procedure: HOT HEMOSTASIS (ARGON PLASMA COAGULATION/BICAP);  Surgeon: Irene Shipper, MD;  Location: [place];  Service: Gastroenterology;  Laterality: N/A;   HOT HEMOSTASIS N/A 02/01/2021   Procedure: HOT HEMOSTASIS (ARGON PLASMA COAGULATION/BICAP);  Surgeon: Yetta Flock, MD;  Location[facility];  Service: Gastroenterology;  Laterality: N/A;   IR GENERIC HISTORICAL  07/09/2016   IR US GUIDE VASC ACCESS RIGHT 07/09/2016 Arne Cleveland, MD [facility]   IR GENERIC HISTORICAL  07/09/2016   IR FLUORO GUIDE CV LINE RIGHT 07/09/2016 Arne Cleveland, MD [facility]   KNEE ARTHROPLASTY Left    LAPAROSCOPIC SIGMOID COLECTOMY N/A 07/11/2016   Procedure: LAPAROSCOPIC SIGMOID COLECTOMY;  Surgeon: Clovis Riley, MD;  Location: [place];  Service: General;  Laterality: N/A;   MASS EXCISION Right 04/10/2020   Procedure: EXCISION SKIN NODULE RIGHT FOREARM;  Surgeon: Waynetta Sandy, MD;  Location: [place];  Service: Vascular;  Laterality: Right;   SCLEROTHERAPY  01/09/2020   Procedure: Clide Deutscher;  Surgeon: Mansouraty, Telford Nab., MD;  Location: [place];  Service: Gastroenterology;;   Clide Deutscher  01/13/2020   Procedure: Clide Deutscher;  Surgeon: Irene Shipper, MD;  Location: [facility] ENDOSCOPY;  Service: Gastroenterology;;   SUBMUCOSAL LIFTING INJECTION  01/09/2020   Procedure: SUBMUCOSAL LIFTING INJECTION;  Surgeon: Irving Copas., MD;  Location: [facility] END[facility];  Service: Gastroenterology;;   TUBAL LIGATION     Family History  Problem Relation Age of Onset   Heart failure Mother    Stroke Mother    Other Father    Colon cancer Neg Hx    Liver disease Neg Hx     Esophageal cancer Neg Hx    Stomach cancer Neg Hx    Inflammatory bowel disease Neg Hx    Rectal cancer Neg Hx    Pancreatic cancer Neg Hx     Social History   Tobacco Use   Smoking status: Never   Smokeless tobacco: Never  Substance Use Topics   Alcohol use: No   ROS  Review of Systems  Cardiovascular:  Negative for chest pain, dyspnea on exertion, leg swelling, palpitations and syncope.  Gastrointestinal:  Negative for constipation, hemorrhoids and melena.  Objective  Blood pressure (!) 147/62, pulse 64, temperature 97.9 F (36.6 C), temperature source Temporal, resp. rate 17, height 5\' 5"  (1.651 m), weight 178 lb (80.7 kg), SpO2 96 %.   Vitals with BMI 07/18/2021 07/18/2021 06/27/2021  Height - 5\' 5"  5\' 5"   Weight - 178 lbs 179 lbs  BMI - 71.24 58.09  Systolic 983 382 505  Diastolic 62 77 68  Pulse 64 63 64   Orthostatic VS for the past 72 hrs (Last 3 readings):  Patient Position BP Location Cuff Size  07/18/21 0855 Sitting Left Arm Large  07/18/21 0844 Sitting Left Arm Normal     Physical Exam Constitutional:  Comments: Moderately obese  Neck:     Vascular: Carotid bruit (Bilateral) present. No JVD.  Cardiovascular:     Rate and Rhythm: Regular rhythm. Bradycardia present.     Pulses: Intact distal pulses.     Heart sounds: Murmur heard.  Early systolic murmur is present with a grade of 2/6 at the upper right sternal border.    No gallop.     Comments: Right arm AV fistula noted and functioning. Pulmonary:     Effort: Pulmonary effort is normal.     Breath sounds: Normal breath sounds.  Abdominal:     General: Bowel sounds are normal.     Palpations: Abdomen is soft.  Musculoskeletal:     Right lower leg: No edema.     Left lower leg: No edema.  Skin:    Capillary Refill: Capillary refill takes less than 2 seconds.   Laboratory examination:   Recent Labs    01/31/21 1537 02/01/21 0210 04/29/21 1612  NA 138 136 137  K 4.3 4.4 3.3*  CL 100 99  101  CO2 30 30 26   GLUCOSE 98 85 110*  BUN 25* 29* 36*  CREATININE 5.99* 6.79* 9.77*  CALCIUM 9.0 8.9 9.8  GFRNONAA 7* 6* 4*   CrCl cannot be calculated (Patient's most recent lab result is older than the maximum 21 days allowed.).  CMP Latest Ref Rng & Units 04/29/2021 02/01/2021 01/31/2021  Glucose 70 - 99 mg/dL 110(H) 85 98  BUN 8 - 23 mg/dL 36(H) 29(H) 25(H)  Creatinine 0.44 - 1.00 mg/dL 9.77(H) 6.79(H) 5.99(H)  Sodium 135 - 145 mmol/L 137 136 138  Potassium 3.5 - 5.1 mmol/L 3.3(L) 4.4 4.3  Chloride 98 - 111 mmol/L 101 99 100  CO2 22 - 32 mmol/L 26 30 30   Calcium 8.9 - 10.3 mg/dL 9.8 8.9 9.0  Total Protein 6.5 - 8.1 g/dL - - -  Total Bilirubin 0.3 - 1.2 mg/dL - - -  Alkaline Phos 38 - 126 U/L - - -  AST 15 - 41 U/L - - -  ALT 0 - 44 U/L - - -   CBC Latest Ref Rng & Units 04/29/2021 02/03/2021 02/02/2021  WBC 4.0 - 10.5 K/uL 5.4 6.0 6.3  Hemoglobin 12.0 - 15.0 g/dL 11.8(L) 9.9(L) 9.4(L)  Hematocrit 36.0 - 46.0 % 39.4 31.9(L) 30.4(L)  Platelets 150 - 400 K/uL 213 241 240   Lipid Panel     Component Value Date/Time   CHOL 198 10/06/2019 1510   TRIG 51 10/06/2019 1510   HDL 53 10/06/2019 1510   CHOLHDL 3.7 10/06/2019 1510   VLDL 10 10/06/2019 1510   LDLCALC 135 (H) 10/06/2019 1510    Lipid Panel No results for input(s): CHOL, TRIG, LDLCALC, VLDL, HDL, CHOLHDL, LDLDIRECT in the last 8760 hours. HEMOGLOBIN A1C No results found for: HGBA1C, MPG   TSH Recent Labs    11/20/20 1025  TSH 1.36   External labs:  Labs 02/23/2021:  151, creatinine 8.48, potassium 3.7, sodium 142.  Hb 9.0/HCT 30.6, normal indicis.  Medications and allergies   Allergies  Allergen Reactions   Latex Rash   Penicillins Other (See Comments)    Yeast infection / Childhood   Sulfa Antibiotics Rash   Tape Other (See Comments)    Plastic, silicone, and paper tape causes bruising and pulls off skin. Cloth tape works fine     Current Outpatient Medications on File Prior to Visit  Medication  Sig Dispense Refill   B Complex-C-Zn-Folic Acid (  DIALYVITE 800 WITH ZINC) 0.8 MG TABS Take 1 tablet by mouth daily.     CALCITRIOL PO Take by mouth 3 (three) times a week.     ethyl chloride spray Apply 1 application topically every Monday, Wednesday, and Friday with hemodialysis.  12   hydrALAZINE (APRESOLINE) 25 MG tablet Take 1 tablet (25 mg total) by mouth 3 (three) times daily. (Patient taking differently: Take 25 mg by mouth in the morning and at bedtime.) 270 tablet 3   LINZESS 145 MCG CAPS capsule TAKE 1 CAPSULE BY MOUTH ONCE DAILY BEFORE BREAKFAST 30 capsule 2   pantoprazole (PROTONIX) 40 MG tablet Take 1 tablet (40 mg total) by mouth 2 (two) times daily. 90 tablet 3   warfarin (COUMADIN) 5 MG tablet USE AS DIRECTED BY COUMADIN CLINIC (Patient taking differently: Take 2.5-5 mg by mouth See admin instructions. Currently on: 5 mg every Mon, Wed; 2.5 mg all other days) 113 tablet 5   No current facility-administered medications on file prior to visit.   Radiology:   No results found.  Cardiac Studies:    Lexiscan myoview stress test 09/06/2018:  1. Lexiscan stress test was performed. Exercise capacity was not assessed. Stress symptoms included dyspnea, dizziness. Peak effect blood pressure was 158/68 mmHg. The resting and stress electrocardiogram demonstrated normal sinus rhythm, normal resting conduction, no resting arrhythmias and normal rest repolarization.  Stress EKG is non diagnostic for ischemia as it is a pharmacologic stress.  2. The overall quality of the study is good. There is no evidence of abnormal lung activity. Stress and rest SPECT images demonstrate homogeneous tracer distribution throughout the myocardium. Gated SPECT imaging reveals normal myocardial thickening and wall motion. The left ventricular ejection fraction was normal (61%).   3. Low risk study.  Carotid artery duplex  04/24/2020: Stenosis in the right external carotid artery (<50%). Minimal stenosis in the  left internal carotid artery (minimal). Stenosis in the left common carotid artery (<50%). There is mild diffuse heterogeneous plaque in bilateral carotid arteries. Antegrade right vertebral artery flow.  Follow up in one year is appropriate if clinically indicated. No significant change from 12/02/2016.  Echocardiogram 12/27/2020: Normal LV systolic function with visual EF 60-65%. Left ventricle cavity is normal in size. Moderate left ventricular hypertrophy. Normal global wallmotion. Indeterminate diastolic filling pattern, elevated LAP. No significant valvular heart disease. Compared to study dated 11/24/2017: LVEF is preserved, G2DD is now indeterminate with elevated LAP, MR and TR have improved, otherwise nosignificant change.  EKG  EKG 11/16/2020: Sinus bradycardia at rate of 56 bpm, left axis deviation, left anterior fascicular block.  Anteroseptal infarct old.  Normal QT interval.  No significant change from 01/10/2021, previously heart rate was 76 bpm.  EKG 11/26/2020: Atypical atrial flutter with RVR at rate of 136 bpm, left axis deviation, left anterior fascicular block.  No evidence of ischemia.  Assessment     ICD-10-CM   1. Paroxysmal atrial fibrillation (HCC)  I48.0 EKG 12-Lead    POCT INR    2. Monitoring for long-term anticoagulant use  Z51.81    Z79.01     3. ESRD on hemodialysis (HCC)  N18.6    Z99.2     4. Orthostatic hypotension  I95.1     5. Carotid atherosclerosis, bilateral  I65.23 atorvastatin (LIPITOR) 10 MG tablet      CHA2DS2-VASc Score is 4.  Yearly risk of stroke: 4.8% (HTN, female, age 46).  Score of 1=0.6; 2=2.2; 3=3.2; 4=4.8; 5=7.2; 6=9.8; 7=>9.8) -(CHF; HTN; vasc disease  DM,  Female = 1; Age <65 =0; 65-74 = 1,  >75 =2; stroke/embolism= 2).   Meds ordered this encounter  Medications   atorvastatin (LIPITOR) 10 MG tablet    Sig: Take 1 tablet (10 mg total) by mouth daily.    Dispense:  30 tablet    Refill:  2   Medications Discontinued During  This Encounter  Medication Reason   carvedilol (COREG) 25 MG tablet    amiodarone (PACERONE) 200 MG tablet    diltiazem (CARDIZEM) 60 MG tablet    DULoxetine (CYMBALTA) 20 MG capsule    isosorbide dinitrate (ISORDIL) 30 MG tablet    Recommendations:   Jane [name]  is a 75 y.o. AA female patient with paroxysmal atrial fibrillation, on low-dose amiodarone, dose reduced due to prolonged QT, end-stage disease on hemodialysis on Monday Wednesday and Friday, orthostatic hypotension, obstructive sleep apnea not on CPAP, external hemorrhoids but tolerating Coumadin.  With a past 6 months she has lost 80 pounds in weight with loss of appetite and hence amiodarone was discontinued as she has maintained sinus rhythm and she was also bradycardic.  All her blood pressure medications have been discontinued.  She is only on hydralazine that she takes on a as needed basis and she is having difficulty during dialysis as her blood pressure goes low and has been started on midodrine which she has not started yet during the times of dialysis.  I have discussed with her not to gain any weight.  She looks much younger she looks much healthier now and blood pressure is also relatively well controlled.  Although blood pressure was elevated today as she is having episodes of hypotension during dialysis I did not make any changes to her medication and she will continue to use hydralazine as needed.  She is also maintaining sinus rhythm without amiodarone or any beta-blockers.  She had sleep apnea, but she had not tolerated CPAP before but is now willing to try this again, she has been set up for a sleep study tomorrow.  We discussed regarding mild hyperlipidemia, 5 years ago her lipids were normal, however a year ago LDL was 135.  She has bilateral carotid atherosclerosis but no significant stenosis but in view of end-stage renal disease, supine hypertension, her age and also carotid atherosclerosis, I prefer her to be on a  statin.  After discussion she is willing to start, I will start her on 10 mg of atorvastatin and she has an appointment coming up soon with Dr. Barry [name] and if LDL is not at goal which is <100 if possible <70, she could increase the dose as necessary.  Patient is in agreement with this.  INR is therapeutic today, 1.9 is appropriate as well.  Will slightly increase the dose of the Coumadin and will recheck in 2 weeks.  Otherwise she is doing well, no clinical evidence of heart failure, I will see her back in a year or sooner if problems.      Adrian Prows, MD, [facility] 07/18/2021, 10:24 AM Office: [phone]

## 2021-07-19 ENCOUNTER — Ambulatory Visit (HOSPITAL_BASED_OUTPATIENT_CLINIC_OR_DEPARTMENT_OTHER): Payer: Medicare Other | Attending: Pulmonary Disease | Admitting: Pulmonary Disease

## 2021-07-19 DIAGNOSIS — R0683 Snoring: Secondary | ICD-10-CM | POA: Diagnosis not present

## 2021-07-19 DIAGNOSIS — R0902 Hypoxemia: Secondary | ICD-10-CM | POA: Insufficient documentation

## 2021-07-19 DIAGNOSIS — N186 End stage renal disease: Secondary | ICD-10-CM | POA: Diagnosis not present

## 2021-07-19 DIAGNOSIS — N2581 Secondary hyperparathyroidism of renal origin: Secondary | ICD-10-CM | POA: Diagnosis not present

## 2021-07-19 DIAGNOSIS — G4733 Obstructive sleep apnea (adult) (pediatric): Secondary | ICD-10-CM | POA: Insufficient documentation

## 2021-07-19 DIAGNOSIS — G4710 Hypersomnia, unspecified: Secondary | ICD-10-CM | POA: Diagnosis not present

## 2021-07-19 DIAGNOSIS — D631 Anemia in chronic kidney disease: Secondary | ICD-10-CM | POA: Diagnosis not present

## 2021-07-19 DIAGNOSIS — Z992 Dependence on renal dialysis: Secondary | ICD-10-CM | POA: Diagnosis not present

## 2021-07-21 DIAGNOSIS — Z992 Dependence on renal dialysis: Secondary | ICD-10-CM | POA: Diagnosis not present

## 2021-07-21 DIAGNOSIS — I158 Other secondary hypertension: Secondary | ICD-10-CM | POA: Diagnosis not present

## 2021-07-21 DIAGNOSIS — N186 End stage renal disease: Secondary | ICD-10-CM | POA: Diagnosis not present

## 2021-07-22 DIAGNOSIS — N2581 Secondary hyperparathyroidism of renal origin: Secondary | ICD-10-CM | POA: Diagnosis not present

## 2021-07-22 DIAGNOSIS — N186 End stage renal disease: Secondary | ICD-10-CM | POA: Diagnosis not present

## 2021-07-22 DIAGNOSIS — Z992 Dependence on renal dialysis: Secondary | ICD-10-CM | POA: Diagnosis not present

## 2021-07-24 DIAGNOSIS — N2581 Secondary hyperparathyroidism of renal origin: Secondary | ICD-10-CM | POA: Diagnosis not present

## 2021-07-24 DIAGNOSIS — N186 End stage renal disease: Secondary | ICD-10-CM | POA: Diagnosis not present

## 2021-07-24 DIAGNOSIS — Z992 Dependence on renal dialysis: Secondary | ICD-10-CM | POA: Diagnosis not present

## 2021-07-25 DIAGNOSIS — I480 Paroxysmal atrial fibrillation: Secondary | ICD-10-CM | POA: Diagnosis not present

## 2021-07-25 DIAGNOSIS — I10 Essential (primary) hypertension: Secondary | ICD-10-CM | POA: Diagnosis not present

## 2021-07-26 DIAGNOSIS — N2581 Secondary hyperparathyroidism of renal origin: Secondary | ICD-10-CM | POA: Diagnosis not present

## 2021-07-26 DIAGNOSIS — Z992 Dependence on renal dialysis: Secondary | ICD-10-CM | POA: Diagnosis not present

## 2021-07-26 DIAGNOSIS — N186 End stage renal disease: Secondary | ICD-10-CM | POA: Diagnosis not present

## 2021-07-29 DIAGNOSIS — N2581 Secondary hyperparathyroidism of renal origin: Secondary | ICD-10-CM | POA: Diagnosis not present

## 2021-07-29 DIAGNOSIS — Z992 Dependence on renal dialysis: Secondary | ICD-10-CM | POA: Diagnosis not present

## 2021-07-29 DIAGNOSIS — N186 End stage renal disease: Secondary | ICD-10-CM | POA: Diagnosis not present

## 2021-07-30 ENCOUNTER — Telehealth: Payer: Self-pay | Admitting: Pulmonary Disease

## 2021-07-30 DIAGNOSIS — R7981 Abnormal blood-gas level: Secondary | ICD-10-CM

## 2021-07-30 NOTE — Telephone Encounter (Signed)
Call patient  Sleep study result  Date of study: 07/19/2021  Impression: Study was negative for significant sleep apnea Did reveal significant oxygen desaturations Oxygen requirement noted  Recommendation: Oxygen supplementation at 1 L during sleep Encouraged weight loss measures Regular exercises Optimize sleep hygiene, try and get 6 to 8 hours of sleep nightly  Follow-up as previously ordered

## 2021-07-30 NOTE — Procedures (Signed)
POLYSOMNOGRAPHY  Last, First: Jane, [name] MRN: [id number] Gender: Female Age (years): 67 Weight (lbs): 180 DOB: June 16, 1946 BMI: 30 Primary Care: No PCP Epworth Score: 5 Referring: Laurin Coder MD Technician: Laren Everts Interpreting: Laurin Coder MD Study Type: NPSG Ordered Study Type: Split Night CPAP Study date: 07/19/2021 Location: [facility] CLINICAL INFORMATION Jane [name] is a 76 year old Female and was referred to the sleep center for evaluation of G47.30 Sleep Apnea, Unspecified (780.57). Indications include Excessive Daytime Sleepiness, Fatigue, Hypertension, Obesity, OSA.  MEDICATIONS Patient self administered medications include: N/A. Medications administered during study include No sleep medicine administered.  SLEEP STUDY TECHNIQUE A multi-channel overnight Polysomnography study was performed. The channels recorded and monitored were central and occipital EEG, electrooculogram (EOG), submentalis EMG (chin), nasal and oral airflow, thoracic and abdominal wall motion, anterior tibialis EMG, snore microphone, electrocardiogram, and a pulse oximetry. TECHNICIAN COMMENTS Comments added by Technician: None Comments added by Scorer: N/A SLEEP ARCHITECTURE The study was initiated at 9:46:57 PM and terminated at 4:53:41 AM. The total recorded time was 426.7 minutes. EEG confirmed total sleep time was 384.5 minutes yielding a sleep efficiency of 90.1%%. Sleep onset after lights out was 25.4 minutes with a REM latency of 14.0 minutes. The patient spent 5.3%% of the night in stage N1 sleep, 71.1%% in stage N2 sleep, 0.0%% in stage N3 and 23.5% in REM. Wake after sleep onset (WASO) was 16.8 minutes. The Arousal Index was 10.9/hour. RESPIRATORY PARAMETERS There were a total of 21 respiratory disturbances out of which 5 were apneas ( 4 obstructive, 1 mixed, 0 central) and 16 hypopneas. The apnea/hypopnea index (AHI) was 3.3 events/hour. The central sleep apnea index was 0  events/hour. The REM AHI was 0.7 events/hour and NREM AHI was 4.1 events/hour. The supine AHI was 3.4 events/hour and the non supine AHI was 3.1 supine during 64.77% of sleep. Respiratory disturbances were associated with oxygen desaturation down to a nadir of 80.0% during sleep. The mean oxygen saturation during the study was 90.7%. The cumulative time under 88% oxygen saturation was 5.5 minutes.  LEG MOVEMENT DATA The total leg movements were 0 with a resulting leg movement index of 0.0/hr .Associated arousal with leg movement index was 0.0/hr.  CARDIAC DATA The underlying cardiac rhythm was most consistent with sinus rhythm. Mean heart rate during sleep was 66.9 bpm. Additional rhythm abnormalities include PVCs.  IMPRESSIONS - No Significant Obstructive Sleep apnea(OSA) - Electrocardiographic data showed presence of PVCs. - Severe Oxygen Desaturation required oxygen supplementation at 1 L - The patient snored with soft snoring volume. - No significant periodic leg movements(PLMs) during sleep. However, no significant associated arousals.  DIAGNOSIS - Nocturnal Hypoxemia (G47.36) - No significant obstructive sleep apnea  RECOMMENDATIONS - Oxygen supplementation on 1 L - Avoid alcohol, sedatives and other CNS depressants that may worsen sleep apnea and disrupt normal sleep architecture. - Sleep hygiene should be reviewed to assess factors that may improve sleep quality. - Weight management and regular exercise should be initiated or continued.  [Electronically signed] 07/30/2021 05:34 AM  Sherrilyn Rist MD NPI: [id number]

## 2021-07-31 DIAGNOSIS — E1129 Type 2 diabetes mellitus with other diabetic kidney complication: Secondary | ICD-10-CM | POA: Diagnosis not present

## 2021-07-31 DIAGNOSIS — Z992 Dependence on renal dialysis: Secondary | ICD-10-CM | POA: Diagnosis not present

## 2021-07-31 DIAGNOSIS — N2581 Secondary hyperparathyroidism of renal origin: Secondary | ICD-10-CM | POA: Diagnosis not present

## 2021-07-31 DIAGNOSIS — I4891 Unspecified atrial fibrillation: Secondary | ICD-10-CM | POA: Diagnosis not present

## 2021-07-31 DIAGNOSIS — N186 End stage renal disease: Secondary | ICD-10-CM | POA: Diagnosis not present

## 2021-08-01 ENCOUNTER — Other Ambulatory Visit: Payer: Self-pay

## 2021-08-01 ENCOUNTER — Ambulatory Visit: Payer: Medicare Other | Admitting: Pharmacist

## 2021-08-01 DIAGNOSIS — Z5181 Encounter for therapeutic drug level monitoring: Secondary | ICD-10-CM | POA: Diagnosis not present

## 2021-08-01 DIAGNOSIS — I480 Paroxysmal atrial fibrillation: Secondary | ICD-10-CM

## 2021-08-01 DIAGNOSIS — Z7901 Long term (current) use of anticoagulants: Secondary | ICD-10-CM | POA: Diagnosis not present

## 2021-08-01 LAB — POCT INR: INR: 1.6 — AB (ref 2.0–3.0)

## 2021-08-01 NOTE — Telephone Encounter (Signed)
I have called and give the patient her results per Dr. Jenetta Downer and she voices understanding. She did not have any question and orders have been placed.

## 2021-08-01 NOTE — Progress Notes (Signed)
Anticoagulation Management Jane [name] is a 76 y.o. female who reports to the clinic for monitoring of warfarin treatment.    Indication: atrial fibrillation CHA2DS2 Vasc Score 4 (Age >5, female, HTN hx), HAS-BLED 2 (Age>65, renal disease)  Duration: indefinite Supervising physician: Adrian Prows  Anticoagulation Clinic Visit History:  Patient does not report signs/symptoms of bleeding or thromboembolism.  Reports no changes in diet, medications, or lifestyle.   Patient continues to experience complains of dysphagia despite recent esophageal dilation. Reports that she has trouble swallowing her food when she eats. Denies any concerns with ensuring that she is able to tolerate oral intake of her medications currently. Reports that she is waiting to follow up with her GI provider to get pathology results and address her symptoms. Next scheduled GI OV on 08/07/21.   Pt currently on amiodarone 100 mg qday for now  Pt reports that she continues to have episodes of hypotensive BP symptoms at time. Continues to hold her hydralazine without significant improvement in her symptoms. Pt notes that she feels fatigued, lightheaded, dizzy, nauseous whenever her SBP <120s,. Pt states that she has been taking PRN meclizine to help with her hypotensive symptoms with mild relief.   Anticoagulation Episode Summary     Current INR goal:  2.0-3.0  TTR:  61.8 % (2.8 y)  Next INR check:  08/15/2021  INR from last check:  1.6 (08/01/2021)  Weekly max warfarin dose:    Target end date:  Indefinite  INR check location:    Preferred lab:    Send INR reminders to:     Indications   Paroxysmal atrial fibrillation (HCC) [I48.0] Monitoring for long-term anticoagulant use [Z51.81 Z79.01]        Comments:           Allergies  Allergen Reactions   Latex Rash   Penicillins Other (See Comments)    Yeast infection / Childhood   Sulfa Antibiotics Rash   Tape Other (See Comments)    Plastic, silicone, and  paper tape causes bruising and pulls off skin. Cloth tape works fine    Current Outpatient Medications:    midodrine (PROAMATINE) 10 MG tablet, Take 10 mg by mouth 3 (three) times daily as needed (low BP and dizziness and lightheadedness symptoms.)., Disp: , Rfl:    atorvastatin (LIPITOR) 10 MG tablet, Take 1 tablet (10 mg total) by mouth daily., Disp: 30 tablet, Rfl: 2   B Complex-C-Zn-Folic Acid (DIALYVITE 093 WITH ZINC) 0.8 MG TABS, Take 1 tablet by mouth daily., Disp: , Rfl:    CALCITRIOL PO, Take by mouth 3 (three) times a week., Disp: , Rfl:    ethyl chloride spray, Apply 1 application topically every Monday, Wednesday, and Friday with hemodialysis., Disp: , Rfl: 12   hydrALAZINE (APRESOLINE) 25 MG tablet, Take 1 tablet (25 mg total) by mouth 3 (three) times daily. (Patient taking differently: Take 25 mg by mouth in the morning and at bedtime.), Disp: 270 tablet, Rfl: 3   LINZESS 145 MCG CAPS capsule, TAKE 1 CAPSULE BY MOUTH ONCE DAILY BEFORE BREAKFAST, Disp: 30 capsule, Rfl: 2   pantoprazole (PROTONIX) 40 MG tablet, Take 1 tablet (40 mg total) by mouth 2 (two) times daily., Disp: 90 tablet, Rfl: 3   warfarin (COUMADIN) 5 MG tablet, USE AS DIRECTED BY COUMADIN CLINIC (Patient taking differently: Take 2.5-5 mg by mouth See admin instructions. Currently on: 5 mg every Mon, Wed; 2.5 mg all other days), Disp: 113 tablet, Rfl: 5 Past Medical History:  Diagnosis Date   Acid reflux    Anemia of chronic disease    Arthritis    Asthma    Atrial fibrillation (HCC)    Bilateral carotid bruits    Complication of anesthesia    "hard to wake up, I have sleep apnea" no CPAP   Diverticulitis    Duodenal ulcer    Dysrhythmia    Afib   ESRD (end stage renal disease) ([place])    MWF [facility]   Headache    Heart murmur    History of blood transfusion    Hypertension    Malaise and fatigue    Orthostatic hypotension    Shortness of breath    " when I walk to fast"   Sleep apnea    Syncope     Tubulovillous adenoma of colon    ASSESSMENT  Recent Results: The most recent result is correlated with 32.5 mg per week:  Lab Results  Component Value Date   INR 1.6 (A) 08/01/2021   INR 1.9 (A) 07/18/2021   INR 1.8 (A) 07/04/2021    Anticoagulation Dosing: Description   INR below goal. Take 7.5 mg today and then increase weekly dose to 2.5 mg every Tues, and 5 mg all other days. Recheck INR in 2 weeks.       INR today: Subtherapeutic. INR continues to remain subtherapeutic on current weekly dose. Pt denies any missed or skipped weekly warfarin dose. Pt verbalized previously discussed weekly dose. Denies any other relevant changes in diet, medications, or lifestyle. Denies any complains of bleeding or bruising symptoms. Denies any TIA/stroke like symptoms. Will increase weekly dose in setting of persistent subtherapeutic INR readings and continue close monitoring and follow up.   PLAN Weekly dose was increased by 18.2% to 32.5 mg/week. Take 7.5 mg today, and then increase weekly dose to 2.5 mg every Tues, and 5 mg all other days. Recheck INR in 2 weeks.   Patient Instructions  INR below goal. Take 7.5 mg today and then increase weekly dose to 2.5 mg every Tues, and 5 mg all other days. Recheck INR in 2 weeks.  Patient advised to contact clinic or seek medical attention if signs/symptoms of bleeding or thromboembolism occur.  Patient verbalized understanding by repeating back information and was advised to contact me if further medication-related questions arise.   Follow-up Return in about 2 weeks (around 08/15/2021).  [name], PharmD  15 minutes spent face-to-face with the patient during the encounter. 50% of time spent on education, including signs/sx bleeding and clotting, as well as food and drug interactions with warfarin. 50% of time was spent on fingerprick POC INR sample collection,processing, results determination, and documentation

## 2021-08-01 NOTE — Patient Instructions (Signed)
INR below goal. Take 7.5 mg today and then increase weekly dose to 2.5 mg every Tues, and 5 mg all other days. Recheck INR in 2 weeks.

## 2021-08-02 ENCOUNTER — Encounter: Payer: Self-pay | Admitting: Physical Medicine & Rehabilitation

## 2021-08-02 ENCOUNTER — Encounter: Payer: Medicare Other | Attending: Physical Medicine & Rehabilitation | Admitting: Physical Medicine & Rehabilitation

## 2021-08-02 VITALS — BP 145/73 | HR 67 | Temp 98.3°F | Ht 65.0 in | Wt 179.0 lb

## 2021-08-02 DIAGNOSIS — M47816 Spondylosis without myelopathy or radiculopathy, lumbar region: Secondary | ICD-10-CM | POA: Diagnosis not present

## 2021-08-02 NOTE — Telephone Encounter (Signed)
Received call from Jane [name] with [employer] who states that she needs the Oxygen order to say nocturnal on it. Revised order and placed it again. Nothing further needed at this time.

## 2021-08-02 NOTE — Patient Instructions (Signed)

## 2021-08-02 NOTE — Addendum Note (Signed)
Addended by: Elby Beck R on: 08/02/2021 03:50 PM   Modules accepted: Orders

## 2021-08-02 NOTE — Progress Notes (Signed)
Bilateral T 12, L1, L2 medial branch blocks under fluoroscopic guidance  Indication: Lumbar pain which is not relieved by medication management or other conservative care and interfering with self-care and mobility.  Informed consent was obtained after describing risks and benefits of the procedure with the patient, this includes bleeding, bruising, infection, paralysis and medication side effects. The patient wishes to proceed and has given written cons3.5ent. The patient was placed in a prone position. The lumbar area was marked and prepped with Betadine. One ML of 1% lidocaine was injected into each of 6 areas into the skin and subcutaneous tissue. Then a 22-gauge 3.5in spinal needle was inserted targeting the junction of the left L3 superior articular process /transverse process junction. Needle was advanced under fluoroscopic guidance. Bone contact was made. Isovue 200 was injected x0.5 mL demonstrating no intravascular uptake. Then a solution containing 2% MPF lidocaine was injected x0.5 mL. Then the left L2 superior articular process in transverse process junction was targeted. Bone contact was made. Isovue 200 was injected x0.5 mL demonstrating no intravascular uptake. Then a solution containing 2% MPF lidocaine was injected x0.5 mL. Then the left L1 superior articular process in transverse process junction was targeted. Bone contact was made. Isovue 200was injected x0.5 mL demonstrating no intravascular uptake. Then a solution 2% MPF lidocaine was injected x0.5 mL. this same procedure was performed on the right side using the same needle, technique, and injectate. Patient tolerated procedure well. Post procedure instructions were given. Please refer to post procedure form.   Pre injection back pain 6/10 Post injection back pain 0/10  Repeat B T12, L1,2 MBB in 6 wks to assess for potential RF for same nerves

## 2021-08-02 NOTE — Progress Notes (Signed)
°  PROCEDURE RECORD [facility]   Name: Jane [name] DOB:01-Nov-1945 MRN: [id number]  Date:08/02/2021  Physician: [name], MD    Nurse/CMA: Jorja Loa MA  Allergies:  Allergies  Allergen Reactions   Latex Rash   Penicillins Other (See Comments)    Yeast infection / Childhood   Sulfa Antibiotics Rash   Tape Other (See Comments)    Plastic, silicone, and paper tape causes bruising and pulls off skin. Cloth tape works fine    Consent Signed: Yes.    Is patient diabetic? No.  CBG today? N/A  Pregnant: No. LMP: No LMP recorded. Patient is postmenopausal. (age 65-55)  Anticoagulants:  Warfarin - Last taken yesterday  morning Anti-inflammatory: no Antibiotics: no  Procedure:  Bilateral T12, L1,2,3, Medial Branch Block  Position: Prone Start Time: 11:21 am  End Time: 11:32 am  Fluoro Time: 1:06  RN/CMA Jane Stfort MA Jane Vantine MA    Time 10:26 am 11:32    BP 145/73 168/75 165/71   Pulse 70 63    Respirations 16 16    O2 Sat 98 95    S/S 6 6    Pain Level 6/10 0/10     D/C home with Daughter, patient A & O X 3, D/C instructions reviewed, and sits independently.

## 2021-08-05 ENCOUNTER — Telehealth: Payer: Self-pay | Admitting: Pharmacist

## 2021-08-05 DIAGNOSIS — N2581 Secondary hyperparathyroidism of renal origin: Secondary | ICD-10-CM | POA: Diagnosis not present

## 2021-08-05 DIAGNOSIS — Z992 Dependence on renal dialysis: Secondary | ICD-10-CM | POA: Diagnosis not present

## 2021-08-05 DIAGNOSIS — N186 End stage renal disease: Secondary | ICD-10-CM | POA: Diagnosis not present

## 2021-08-05 NOTE — Chronic Care Management (AMB) (Signed)
Chronic Care Management Pharmacy Assistant   Name: Jane [name]  MRN: [id number] DOB: [birth year]  Jane [name] is an 76 y.o. year old female who presents for her initial CCM visit with the clinical pharmacist.  Reason for Encounter: Chart prep for initial visit with Jane [name] the clinical pharmacist on 08/12/2021   Conditions to be addressed/monitored: Atrial Fibrillation, HTN, Depression, GERD, and Osteoarthritis  Recent office visits:  05/14/2021 Jane [name] MD - Patient was seen for hypertension with renal disease and additional issues. No medication changes. Follow up in 6 months.  Recent consult visits:  07/23/2021 Jane Penna MD (Physical medicine and rehabilitation) - Patient was seen for spondylosis without myelopathy or rediculopathy, lumbar region. Discontinued Hydralazine. No follow up noted.  07/18/2021 Jane Prows MD (cardiology) - Patient was seen for atrial fibrillation. Started Atorvastatin 10 mg daily. Discontinued Amiodarone, Carvedilol, Diltiazem, Duloxetine and Isosorbide. Follow up in 1 year.  06/27/2021 Jane Penna MD (Physical medicine and rehabilitation) - Patient was seen for chronic left sacroiliac pain and additional issue. No medication changes. Follow up in 4 weeks.   05/22/2021 Jane Rist MD (pulmonary) - Patient was seen for obstructive sleep apnea. Discontinued Albuterol and Amlodipine. Follow up in 3-4 months.  05/08/2021 Jane Britain MD (GI) - Patient was seen for dysphagia and additional issues. Discontinued Fluconazole. No follow up noted.   05/07/2021 Jane Penna MD (Physical medicine and rehabilitation) - Patient was seen for chronic left sacroiliac pain and additional issue. No medication changes. Follow up in 4 weeks.   02/28/2021 Jane Penna MD (Physical medicine and rehabilitation) - Patient was seen for lumbar post laminectomy syndrome and additional issue. Started tramadol 50 mg three times daily.    02/21/2021 Jane [name] DPM (podiatry) - Patient was seen for posterior tibial tendinitis of left lower extremity and additional issues. No medication changes. Follow up in 1 month.  Hospital visits:  Patient was seen at [facility] ED on 04/29/2021 due to lumbar radiculopathy. Discharged after 7 hours.  New?Medications Started at [facility] Discharge:?? Cyclobenzaprine 5 mg three times daily Norco 5/325 mg every 6 hours as needed.  Medication Changes at Hospital Discharge: No medication changes. Medications Discontinued at Hospital Discharge: No medications discontinued. Medications that remain the same after Hospital Discharge:??  -All other medications will remain the same.    Medications: Outpatient Encounter Medications as of 08/05/2021  Medication Sig   atorvastatin (LIPITOR) 10 MG tablet Take 1 tablet (10 mg total) by mouth daily.   B Complex-C-Zn-Folic Acid (DIALYVITE 376 WITH ZINC) 0.8 MG TABS Take 1 tablet by mouth daily.   CALCITRIOL PO Take by mouth 3 (three) times a week.   ethyl chloride spray Apply 1 application topically every Monday, Wednesday, and Friday with hemodialysis.   LINZESS 145 MCG CAPS capsule TAKE 1 CAPSULE BY MOUTH ONCE DAILY BEFORE BREAKFAST   midodrine (PROAMATINE) 10 MG tablet Take 10 mg by mouth 3 (three) times daily as needed (low BP and dizziness and lightheadedness symptoms.).   pantoprazole (PROTONIX) 40 MG tablet Take 1 tablet (40 mg total) by mouth 2 (two) times daily.   warfarin (COUMADIN) 5 MG tablet USE AS DIRECTED BY COUMADIN CLINIC (Patient taking differently: Take 2.5-5 mg by mouth See admin instructions. Currently on: 5 mg every Mon, Wed; 2.5 mg all other days)   No facility-administered encounter medications on file as of 08/05/2021.  Fill History: MIDODRINE 10MG  TAB 07/15/2021 90   ATORVASTATIN 10MG  TAB 07/18/2021 30   PANTOPRAZOLE  40MG  DR TAB 07/15/2021 45   WARFARIN 5MG         TAB 02/18/2021 90   Have you seen  any other providers since your last visit? **Patient has not seen any other providers recently.   Any changes in your medications or health? No current medication changes not noted in her chart.   Any side effects from any medications? Patient denies any side effects from her medications.   Do you have an symptoms or problems not managed by your medications? Patient states Midodrine is not working for her blood pressure  Any concerns about your health right now? Patient is concerned about weight loss, she is having difficulty eating.   Has your provider asked that you check blood pressure, blood sugar, or follow special diet at home? Patient is checking blood pressures, she states her blood pressure monitor reports directly to her cardiologist.  Do you get any type of exercise on a regular basis? Patient states she is not able to walk due to back problems  Can you think of a goal you would like to reach for your health? Patient would like to be able to walk better and get independence back.  Do you have any problems getting your medications? Patient states she is having issues with transportation to be able to get her medications.  Is there anything that you would like to discuss during the appointment? Patient denies any other concerns.   Please bring medications and supplements to appointment   Care Gaps: AWV - scheduled for 08/06/2021 Last BP - 145/73 on 08/02/2021 Shingrix - never done Covid vaccine - overdue  Star Rating Drugs: Atorvastatin 10 mg - last filled 07/18/2021 30 DS at [place] [phone]

## 2021-08-06 ENCOUNTER — Ambulatory Visit (INDEPENDENT_AMBULATORY_CARE_PROVIDER_SITE_OTHER): Payer: Medicare Other

## 2021-08-06 VITALS — BP 130/60 | HR 70 | Temp 97.5°F | Ht 64.0 in | Wt 182.5 lb

## 2021-08-06 DIAGNOSIS — Z0000 Encounter for general adult medical examination without abnormal findings: Secondary | ICD-10-CM

## 2021-08-06 NOTE — Patient Instructions (Signed)
Jane [name] , Thank you for taking time to come for your Medicare Wellness Visit. I appreciate your ongoing commitment to your health goals. Please review the following plan we discussed and let me know if I can assist you in the future.   Screening recommendations/referrals: Colonoscopy: completed 02/01/2021 Mammogram: patient to schedule Bone Density: completed 07/22/2015 Recommended yearly ophthalmology/optometry visit for glaucoma screening and checkup Recommended yearly dental visit for hygiene and checkup  Vaccinations: Influenza vaccine: completed 04/26/2021 Pneumococcal vaccine: completed 07/20/2015 Tdap vaccine: completed 06/20/2015, due 06/19/2025 Shingles vaccine: discussed   Covid-19: 05/26/2020, 09/25/2019, 09/03/2019  Advanced directives: Please bring a copy of your POA (Power of Attorney) and/or Living Will to your next appointment.   Conditions/risks identified: none  Next appointment: Follow up in one year for your annual wellness visit    Preventive Care 65 Years and Older, Female Preventive care refers to lifestyle choices and visits with your health care provider that can promote health and wellness. What does preventive care include? A yearly physical exam. This is also called an annual well check. Dental exams once or twice a year. Routine eye exams. Ask your health care provider how often you should have your eyes checked. Personal lifestyle choices, including: Daily care of your teeth and gums. Regular physical activity. Eating a healthy diet. Avoiding tobacco and drug use. Limiting alcohol use. Practicing safe sex. Taking low-dose aspirin every day. Taking vitamin and mineral supplements as recommended by your health care provider. What happens during an annual well check? The services and screenings done by your health care provider during your annual well check will depend on your age, overall health, lifestyle risk factors, and family history of  disease. Counseling  Your health care provider may ask you questions about your: Alcohol use. Tobacco use. Drug use. Emotional well-being. Home and relationship well-being. Sexual activity. Eating habits. History of falls. Memory and ability to understand (cognition). Work and work Statistician. Reproductive health. Screening  You may have the following tests or measurements: Height, weight, and BMI. Blood pressure. Lipid and cholesterol levels. These may be checked every 5 years, or more frequently if you are over 63 years old. Skin check. Lung cancer screening. You may have this screening every year starting at age 69 if you have a 30-pack-year history of smoking and currently smoke or have quit within the past 15 years. Fecal occult blood test (FOBT) of the stool. You may have this test every year starting at age 32. Flexible sigmoidoscopy or colonoscopy. You may have a sigmoidoscopy every 5 years or a colonoscopy every 10 years starting at age 25. Hepatitis C blood test. Hepatitis B blood test. Sexually transmitted disease (STD) testing. Diabetes screening. This is done by checking your blood sugar (glucose) after you have not eaten for a while (fasting). You may have this done every 1-3 years. Bone density scan. This is done to screen for osteoporosis. You may have this done starting at age 14. Mammogram. This may be done every 1-2 years. Talk to your health care provider about how often you should have regular mammograms. Talk with your health care provider about your test results, treatment options, and if necessary, the need for more tests. Vaccines  Your health care provider may recommend certain vaccines, such as: Influenza vaccine. This is recommended every year. Tetanus, diphtheria, and acellular pertussis (Tdap, Td) vaccine. You may need a Td booster every 10 years. Zoster vaccine. You may need this after age 14. Pneumococcal 13-valent conjugate (PCV13) vaccine. One  dose is recommended after age 73. Pneumococcal polysaccharide (PPSV23) vaccine. One dose is recommended after age 59. Talk to your health care provider about which screenings and vaccines you need and how often you need them. This information is not intended to replace advice given to you by your health care provider. Make sure you discuss any questions you have with your health care provider. Document Released: 08/03/2015 Document Revised: 03/26/2016 Document Reviewed: 05/08/2015 Elsevier Interactive Patient Education  2017 [place] Prevention in the Home Falls can cause injuries. They can happen to people of all ages. There are many things you can do to make your home safe and to help prevent falls. What can I do on the outside of my home? Regularly fix the edges of walkways and driveways and fix any cracks. Remove anything that might make you trip as you walk through a door, such as a raised step or threshold. Trim any bushes or trees on the path to your home. Use bright outdoor lighting. Clear any walking paths of anything that might make someone trip, such as rocks or tools. Regularly check to see if handrails are loose or broken. Make sure that both sides of any steps have handrails. Any raised decks and porches should have guardrails on the edges. Have any leaves, snow, or ice cleared regularly. Use sand or salt on walking paths during winter. Clean up any spills in your garage right away. This includes oil or grease spills. What can I do in the bathroom? Use night lights. Install grab bars by the toilet and in the tub and shower. Do not use towel bars as grab bars. Use non-skid mats or decals in the tub or shower. If you need to sit down in the shower, use a plastic, non-slip stool. Keep the floor dry. Clean up any water that spills on the floor as soon as it happens. Remove soap buildup in the tub or shower regularly. Attach bath mats securely with double-sided  non-slip rug tape. Do not have throw rugs and other things on the floor that can make you trip. What can I do in the bedroom? Use night lights. Make sure that you have a light by your bed that is easy to reach. Do not use any sheets or blankets that are too big for your bed. They should not hang down onto the floor. Have a firm chair that has side arms. You can use this for support while you get dressed. Do not have throw rugs and other things on the floor that can make you trip. What can I do in the kitchen? Clean up any spills right away. Avoid walking on wet floors. Keep items that you use a lot in easy-to-reach places. If you need to reach something above you, use a strong step stool that has a grab bar. Keep electrical cords out of the way. Do not use floor polish or wax that makes floors slippery. If you must use wax, use non-skid floor wax. Do not have throw rugs and other things on the floor that can make you trip. What can I do with my stairs? Do not leave any items on the stairs. Make sure that there are handrails on both sides of the stairs and use them. Fix handrails that are broken or loose. Make sure that handrails are as long as the stairways. Check any carpeting to make sure that it is firmly attached to the stairs. Fix any carpet that is loose or worn. Avoid  having throw rugs at the top or bottom of the stairs. If you do have throw rugs, attach them to the floor with carpet tape. Make sure that you have a light switch at the top of the stairs and the bottom of the stairs. If you do not have them, ask someone to add them for you. What else can I do to help prevent falls? Wear shoes that: Do not have high heels. Have rubber bottoms. Are comfortable and fit you well. Are closed at the toe. Do not wear sandals. If you use a stepladder: Make sure that it is fully opened. Do not climb a closed stepladder. Make sure that both sides of the stepladder are locked into place. Ask  someone to hold it for you, if possible. Clearly mark and make sure that you can see: Any grab bars or handrails. First and last steps. Where the edge of each step is. Use tools that help you move around (mobility aids) if they are needed. These include: Canes. Walkers. Scooters. Crutches. Turn on the lights when you go into a dark area. Replace any light bulbs as soon as they burn out. Set up your furniture so you have a clear path. Avoid moving your furniture around. If any of your floors are uneven, fix them. If there are any pets around you, be aware of where they are. Review your medicines with your doctor. Some medicines can make you feel dizzy. This can increase your chance of falling. Ask your doctor what other things that you can do to help prevent falls. This information is not intended to replace advice given to you by your health care provider. Make sure you discuss any questions you have with your health care provider. Document Released: 05/03/2009 Document Revised: 12/13/2015 Document Reviewed: 08/11/2014 Elsevier Interactive Patient Education  2017 [employer].

## 2021-08-06 NOTE — Progress Notes (Signed)
This visit occurred during the SARS-CoV-2 public health emergency.  Safety protocols were in place, including screening questions prior to the visit, additional usage of staff PPE, and extensive cleaning of exam room while observing appropriate contact time as indicated for disinfecting solutions.  Subjective:   Jane [name] is a 76 y.o. female who presents for Medicare Annual (Subsequent) preventive examination.  Review of Systems     Cardiac Risk Factors include: advanced age (>14men, >25 women);hypertension;obesity (BMI >30kg/m2)     Objective:    Today's Vitals   08/06/21 0946 08/06/21 0952  BP: 130/60   Pulse: 70   Temp: (!) 97.5 F (36.4 C)   TempSrc: Oral   SpO2: 97%   Weight: 182 lb 8 oz (82.8 kg)   Height: 5\' 4"  (1.626 m)   PainSc:  8    Body mass index is 31.33 kg/m.  Advanced Directives 08/06/2021 07/19/2021 01/31/2021 01/31/2021 11/24/2020 10/18/2020 07/31/2020  Does Patient Have a Medical Advance Directive? Yes Yes Yes Yes No Yes Yes  Type of Paramedic of [place];Living will [place];Living will [place];Living will [place];Living will  Does patient want to make changes to medical advance directive? - No - Patient declined No - Patient declined - - - -  Copy of [place] in Chart? No - copy requested No - copy requested - - - - No - copy requested  Would patient like information on creating a medical advance directive? - - - - Yes (ED - Information included in AVS) - -  Pre-existing out of facility DNR order (yellow form or pink MOST form) - - - - - - -    Current Medications (verified) Outpatient Encounter Medications as of 08/06/2021  Medication Sig   atorvastatin (LIPITOR) 10 MG tablet Take 1 tablet (10 mg total) by mouth daily.   B Complex-C-Zn-Folic Acid (DIALYVITE 154 WITH ZINC) 0.8 MG TABS Take 1 tablet by mouth daily.    CALCITRIOL PO Take by mouth 3 (three) times a week.   ethyl chloride spray Apply 1 application topically every Monday, Wednesday, and Friday with hemodialysis.   LINZESS 145 MCG CAPS capsule TAKE 1 CAPSULE BY MOUTH ONCE DAILY BEFORE BREAKFAST   midodrine (PROAMATINE) 10 MG tablet Take 10 mg by mouth 3 (three) times daily as needed (low BP and dizziness and lightheadedness symptoms.).   pantoprazole (PROTONIX) 40 MG tablet Take 1 tablet (40 mg total) by mouth 2 (two) times daily.   warfarin (COUMADIN) 5 MG tablet USE AS DIRECTED BY COUMADIN CLINIC (Patient taking differently: Take 2.5-5 mg by mouth See admin instructions. Currently on: 5 mg every Mon, Wed; 2.5 mg all other days)   No facility-administered encounter medications on file as of 08/06/2021.    Allergies (verified) Latex, Penicillins, Sulfa antibiotics, and Tape   History: Past Medical History:  Diagnosis Date   Acid reflux    Anemia of chronic disease    Arthritis    Asthma    Atrial fibrillation (HCC)    Bilateral carotid bruits    Complication of anesthesia    "hard to wake up, I have sleep apnea" no CPAP   Diverticulitis    Duodenal ulcer    Dysrhythmia    Afib   ESRD (end stage renal disease) ([place])    MWF [place]   Headache    Heart murmur    History of blood transfusion  Hypertension    Malaise and fatigue    Orthostatic hypotension    Shortness of breath    " when I walk to fast"   Sleep apnea    Syncope    Tubulovillous adenoma of colon    Past Surgical History:  Procedure Laterality Date   A/V FISTULAGRAM Right 10/18/2020   Procedure: A/V FISTULAGRAM;  Surgeon: Marty Heck, MD;  Location: [place] [facility];  Service: Cardiovascular;  Laterality: Right;   AV FISTULA PLACEMENT     BACK SURGERY     Lumbar fusion L 4 and L 5   BIOPSY  01/09/2020   Procedure: BIOPSY;  Surgeon: Rush Landmark Telford Nab., MD;  Location: [place];  Service: Gastroenterology;;   BIOPSY  01/31/2021    Procedure: BIOPSY;  Surgeon: Irving Copas., MD;  Location: [place];  Service: Gastroenterology;;   COLONOSCOPY     COLONOSCOPY N/A 02/01/2021   Procedure: COLONOSCOPY;  Surgeon: Yetta Flock, MD;  Location: [place];  Service: Gastroenterology;  Laterality: N/A;   ENDOSCOPIC MUCOSAL RESECTION N/A 01/09/2020   Procedure: ENDOSCOPIC MUCOSAL RESECTION;  Surgeon: Rush Landmark Telford Nab., MD;  Location: [place];  Service: Gastroenterology;  Laterality: N/A;   ENDOSCOPIC MUCOSAL RESECTION N/A 01/31/2021   Procedure: ENDOSCOPIC MUCOSAL RESECTION;  Surgeon: Rush Landmark Telford Nab., MD;  Location: [place];  Service: Gastroenterology;  Laterality: N/A;   ESOPHAGOGASTRODUODENOSCOPY     ESOPHAGOGASTRODUODENOSCOPY (EGD) WITH PROPOFOL N/A 01/09/2020   Procedure: ESOPHAGOGASTRODUODENOSCOPY (EGD) WITH PROPOFOL;  Surgeon: Rush Landmark Telford Nab., MD;  Location: [place];  Service: Gastroenterology;  Laterality: N/A;   ESOPHAGOGASTRODUODENOSCOPY (EGD) WITH PROPOFOL N/A 01/13/2020   Procedure: ESOPHAGOGASTRODUODENOSCOPY (EGD) WITH PROPOFOL;  Surgeon: Irene Shipper, MD;  Location: [place];  Service: Gastroenterology;  Laterality: N/A;   ESOPHAGOGASTRODUODENOSCOPY (EGD) WITH PROPOFOL N/A 01/31/2021   Procedure: ESOPHAGOGASTRODUODENOSCOPY (EGD) WITH PROPOFOL;  Surgeon: Rush Landmark Telford Nab., MD;  Location: [place];  Service: Gastroenterology;  Laterality: N/A;   EUS N/A 01/09/2020   Procedure: UPPER ENDOSCOPIC ULTRASOUND (EUS) RADIAL;  Surgeon: Irving Copas., MD;  Location: [place];  Service: Gastroenterology;  Laterality: N/A;   HEMOSTASIS CLIP PLACEMENT  01/09/2020   Procedure: HEMOSTASIS CLIP PLACEMENT;  Surgeon: Irving Copas., MD;  Location: [place];  Service: Gastroenterology;;   HEMOSTASIS CLIP PLACEMENT  01/31/2021   Procedure: HEMOSTASIS CLIP PLACEMENT;  Surgeon: Irving Copas., MD;  Location: [place];  Service:  Gastroenterology;;   HEMOSTASIS CONTROL  01/13/2020   Procedure: HEMOSTASIS CONTROL;  Surgeon: Irene Shipper, MD;  Location: [facility] END[facility]COPY;  Service: Gastroenterology;;  hemaspray   HOT HEMOSTASIS N/A 01/13/2020   Procedure: HOT HEMOSTASIS (ARGON PLASMA COAGULATION/BICAP);  Surgeon: Irene Shipper, MD;  Location: [place];  Service: Gastroenterology;  Laterality: N/A;   HOT HEMOSTASIS N/A 02/01/2021   Procedure: HOT HEMOSTASIS (ARGON PLASMA COAGULATION/BICAP);  Surgeon: Yetta Flock, MD;  Location: [facility] ENDOSCOPY;  Service: Gastroenterology;  Laterality: N/A;   IR GENERIC HISTORICAL  07/09/2016   IR US GUIDE VASC ACCESS RIGHT 07/09/2016 Arne Cleveland, MD [facility]   IR GENERIC HISTORICAL  07/09/2016   IR FLUORO GUIDE CV LINE RIGHT 07/09/2016 Arne Cleveland, MD [facility]   KNEE ARTHROPLASTY Left    LAPAROSCOPIC SIGMOID COLECTOMY N/A 07/11/2016   Procedure: LAPAROSCOPIC SIGMOID COLECTOMY;  Surgeon: Clovis Riley, MD;  Location: [place];  Service: General;  Laterality: N/A;   MASS EXCISION Right 04/10/2020   Procedure: EXCISION SKIN NODULE RIGHT FOREARM;  Surgeon: Waynetta Sandy, MD;  [facility];  Service: Vascular;  Laterality: Right;   SCLEROTHERAPY  01/09/2020   Procedure: SCLEROTHERAPY;  Surgeon: Mansouraty, Telford Nab., MD;  Location: [place];  Service: Gastroenterology;;   Clide Deutscher  01/13/2020   Procedure: Clide Deutscher;  Surgeon: Irene Shipper, MD;  Location[facility];  Service: Gastroenterology;;   SUBMUCOSAL LIFTING INJECTION  01/09/2020   Procedure: SUBMUCOSAL LIFTING INJECTION;  Surgeon: Irving Copas., MD;  Location: [facility] ENDOSCOPY;  Service: Gastroenterology;;   TUBAL LIGATION     Family History  Problem Relation Age of Onset   Heart failure Mother    Stroke Mother    Other Father    Colon cancer Neg Hx    Liver disease Neg Hx    Esophageal cancer Neg Hx    Stomach cancer Neg Hx    Inflammatory bowel disease Neg Hx    Rectal  cancer Neg Hx    Pancreatic cancer Neg Hx    Social History   Socioeconomic History   Marital status: Divorced    Spouse name: Not on file   Number of children: 4   Years of education: 14   Highest education level: Associate degree: occupational, Hotel manager, or vocational program  Occupational History   Occupation: Retired  Tobacco Use   Smoking status: Never   Smokeless tobacco: Never  Vaping Use   Vaping Use: Never used  Substance and Sexual Activity   Alcohol use: No   Drug use: No   Sexual activity: Never  Other Topics Concern   Not on file  Social History Narrative   HH 1   Divorced   Outpatient dialysis Mon, Wed, Fri   4 children: 1 daughter locally is an Designer, multimedia and 3 sons in [place] Determinants of Health   Financial Resource Strain: Low Risk    Difficulty of Paying Living Expenses: Not hard at all  Food Insecurity: No Food Insecurity   Worried About Charity fundraiser in the Last Year: Never true   Arboriculturist in the Last Year: Never true  Transportation Needs: No Transportation Needs   Lack of Transportation (Medical): No   Lack of Transportation (Non-Medical): No  Physical Activity: Inactive   Days of Exercise per Week: 0 days   Minutes of Exercise per Session: 0 min  Stress: No Stress Concern Present   Feeling of Stress : Not at all  Social Connections: Not on file    Tobacco Counseling Counseling given: Not Answered   Clinical Intake:  Pre-visit preparation completed: Yes  Pain : 0-10 Pain Score: 8  Pain Type: Chronic pain Pain Orientation: Lower Pain Descriptors / Indicators: Aching, Sharp Pain Onset: More than a month ago Pain Frequency: Constant     Nutritional Status: BMI > 30  Obese Nutritional Risks: None Diabetes: No  How often do you need to have someone help you when you read instructions, pamphlets, or other written materials from your doctor or pharmacy?: 1 - Never What is the last grade level you  completed in school?: some college  Diabetic? no  Interpreter Needed?: No  Information entered by :: NAllen LPN   Activities of Daily Living In your present state of health, do you have any difficulty performing the following activities: 08/06/2021 01/31/2021  Hearing? N N  Vision? N N  Difficulty concentrating or making decisions? N N  Walking or climbing stairs? Y N  Dressing or bathing? N N  Doing errands, shopping? N N  Preparing  Food and eating ? N -  Using the Toilet? N -  In the past six months, have you accidently leaked urine? N -  Do you have problems with loss of bowel control? N -  Managing your Medications? N -  Managing your Finances? N -  Housekeeping or managing your Housekeeping? N -  Some recent data might be hidden    Patient Care Team: [name], [name], MD as PCP - General (Family Medicine) Sidonie Dickens, MD as Referring Physician (Psychiatry) [facility], [place], [facility] as Pharmacist (Pharmacist)  Indicate any recent Medical Services you may have received from other than [facility] providers in the past year (date may be approximate).     Assessment:   This is a routine wellness examination for Emary.  Hearing/Vision screen Vision Screening - Comments:: No regular eye exams,   Dietary issues and exercise activities discussed: Current Exercise Habits: The patient does not participate in regular exercise at present   Goals Addressed             This Visit's Progress    Patient Stated       08/06/2021, wants to be able to walk more       Depression Screen PHQ 2/9 Scores 08/06/2021 08/02/2021 06/27/2021 05/07/2021 02/28/2021 10/23/2020 07/31/2020  PHQ - 2 Score 0 0 2 0 1 0 1  PHQ- 9 Score - - - - 2 4 -    Fall Risk Fall Risk  08/06/2021 08/02/2021 06/27/2021 05/07/2021 02/28/2021  Falls in the past year? 1 0 0 0 0  Comment had bad back pains - - - -  Number falls in past yr: 1 0 0 0 0  Comment - - - - -  Injury with  Fall? 0 0 0 0 0  Risk for fall due to : Impaired balance/gait;Medication side effect;History of fall(s) - - - -  Risk for fall due to: Comment - - - - -  Follow up Education provided;Falls evaluation completed;Falls prevention discussed - - - -    FALL RISK PREVENTION PERTAINING TO THE HOME:  Any stairs in or around the home? No  If so, are there any without handrails?  N/a Home free of loose throw rugs in walkways, pet beds, electrical cords, etc? Yes  Adequate lighting in your home to reduce risk of falls? Yes   ASSISTIVE DEVICES UTILIZED TO PREVENT FALLS:  Life alert? No  Use of a cane, walker or w/c? No  Grab bars in the bathroom? Yes  Shower chair or bench in shower? No  Elevated toilet seat or a handicapped toilet? Yes   TIMED UP AND GO:  Was the test performed? No .    Gait slow and steady without use of assistive device  Cognitive Function: MMSE - Mini Mental State Exam 10/16/2016  Not completed: (No Data)     6CIT Screen 08/06/2021 07/31/2020 07/28/2019  What Year? 0 points 0 points 0 points  What month? 0 points 0 points 0 points  What time? 0 points - 0 points  Count back from 20 0 points - 0 points  Months in reverse 2 points - 0 points  Repeat phrase 6 points - 0 points  Total Score 8 - 0    Immunizations Immunization History  Administered Date(s) Administered   Influenza, High Dose Seasonal PF 04/08/2019, 05/07/2020, 04/26/2021   Influenza,inj,quad, With Preservative 04/12/2018   Influenza-Unspecified 04/20/2016, 05/21/2018   PFIZER(Purple Top)SARS-COV-2 Vaccination 09/03/2019, 09/25/2019, 05/26/2020  Pneumococcal Conjugate-13 07/20/2015   Pneumococcal Polysaccharide-23 10/12/2012   Tdap 06/20/2015    TDAP status: Up to date  Flu Vaccine status: Up to date  Pneumococcal vaccine status: Up to date  Covid-19 vaccine status: Completed vaccines  Qualifies for Shingles Vaccine? Yes   Zostavax completed No   Shingrix Completed?: No.    Education  has been provided regarding the importance of this vaccine. Patient has been advised to call insurance company to determine out of pocket expense if they have not yet received this vaccine. Advised may also receive vaccine at local pharmacy or Health Dept. Verbalized acceptance and understanding.  Screening Tests Health Maintenance  Topic Date Due   Zoster Vaccines- Shingrix (1 of 2) Never done   COVID-19 Vaccine (4 - Booster for Pfizer series) 07/21/2020   COLONOSCOPY (Pts 45-30yrs Insurance coverage will need to be confirmed)  02/02/2024   TETANUS/TDAP  06/19/2025   Pneumonia Vaccine 46+ Years old  Completed   INFLUENZA VACCINE  Completed   DEXA SCAN  Completed   HPV VACCINES  Aged Out   Hepatitis C Screening  Discontinued    Health Maintenance  Health Maintenance Due  Topic Date Due   Zoster Vaccines- Shingrix (1 of 2) Never done   COVID-19 Vaccine (4 - Booster for Pfizer series) 07/21/2020    Colorectal cancer screening: Type of screening: Colonoscopy. Completed 02/01/2021. Repeat every 3 years  Mammogram status: patient to schedule  Bone Density status: Completed 07/22/2015.  Lung Cancer Screening: (Low Dose CT Chest recommended if Age 72-80 years, 30 pack-year currently smoking OR have quit w/in 15years.) does qualify.   Lung Cancer Screening Referral: no  Additional Screening:  Hepatitis C Screening: does qualify;   Vision Screening: Recommended annual ophthalmology exams for early detection of glaucoma and other disorders of the eye. Is the patient up to date with their annual eye exam?  No  Who is the provider or what is the name of the office in which the patient attends annual eye exams? none If pt is not established with a provider, would they like to be referred to a provider to establish care? No .   Dental Screening: Recommended annual dental exams for proper oral hygiene  Community Resource Referral / Chronic Care Management: CRR required this visit?  No    CCM required this visit?  No      Plan:     I have personally reviewed and noted the following in the patients chart:   Medical and social history Use of alcohol, tobacco or illicit drugs  Current medications and supplements including opioid prescriptions.  Functional ability and status Nutritional status Physical activity Advanced directives List of other physicians Hospitalizations, surgeries, and ER visits in previous 12 months Vitals Screenings to include cognitive, depression, and falls Referrals and appointments  In addition, I have reviewed and discussed with patient certain preventive protocols, quality metrics, and best practice recommendations. A written personalized care plan for preventive services as well as general preventive health recommendations were provided to patient.     Kellie Simmering, LPN   11/12/9561   Nurse Notes: none

## 2021-08-07 ENCOUNTER — Ambulatory Visit (INDEPENDENT_AMBULATORY_CARE_PROVIDER_SITE_OTHER): Payer: Medicare Other | Admitting: Internal Medicine

## 2021-08-07 ENCOUNTER — Encounter: Payer: Self-pay | Admitting: Internal Medicine

## 2021-08-07 VITALS — BP 108/48 | HR 56 | Ht 64.0 in | Wt 172.8 lb

## 2021-08-07 DIAGNOSIS — K224 Dyskinesia of esophagus: Secondary | ICD-10-CM

## 2021-08-07 DIAGNOSIS — Z992 Dependence on renal dialysis: Secondary | ICD-10-CM | POA: Diagnosis not present

## 2021-08-07 DIAGNOSIS — D1330 Benign neoplasm of unspecified part of small intestine: Secondary | ICD-10-CM

## 2021-08-07 DIAGNOSIS — N186 End stage renal disease: Secondary | ICD-10-CM | POA: Diagnosis not present

## 2021-08-07 DIAGNOSIS — K5909 Other constipation: Secondary | ICD-10-CM | POA: Diagnosis not present

## 2021-08-07 DIAGNOSIS — K219 Gastro-esophageal reflux disease without esophagitis: Secondary | ICD-10-CM | POA: Diagnosis not present

## 2021-08-07 DIAGNOSIS — N2581 Secondary hyperparathyroidism of renal origin: Secondary | ICD-10-CM | POA: Diagnosis not present

## 2021-08-07 MED ORDER — PANTOPRAZOLE SODIUM 40 MG PO TBEC: 40.0000 mg | DELAYED_RELEASE_TABLET | Freq: Two times a day (BID) | ORAL | 3 refills | Status: DC

## 2021-08-07 MED ORDER — LINACLOTIDE 145 MCG PO CAPS: ORAL_CAPSULE | ORAL | 3 refills | Status: DC

## 2021-08-07 NOTE — Progress Notes (Signed)
Subjective:    [name], female    DOB: May 15, 1946, 76 y.o.   MRN: [id number]  HPI Jane [name] is a 76 year old female with a history of tubulovillous adenoma of the duodenum status post EMR, GERD, H. pylori negative gastritis, subcentimeter hyperplastic gastric polyps, history of multiple adenomatous colon polyps, bleeding splenic flexure AVM/aberrant vessel summer 2022, ESRD on hemodialysis, atrial fibrillation on warfarin who is here for follow-up.  She is here alone today.  She was last here for upper endoscopy which I performed on 05/31/2021.  This was performed for dysphagia and weight loss.  Also surveillance given history of duodenal adenoma.  The esophagus was normal and was empirically dilated with a Gilman.  There were multiple 5 to 9 mm sessile polyps in the gastric body and on the greater curve of the antrum which were sampled and found to be hyperplastic without dysplasia.  There was mild melanosis in the gastric antrum.  There was nodular mucosa in the second portion of the duodenum which were biopsied and benign.  She reports that the trouble swallowing has definitively improved after dilation.  She still has episodes which are intermittent where it feels that food stops in her upper esophagus.  This can be painful.  Burping particularly by drinking carbonated beverages makes this go away.  She is not having heartburn.  She continues pantoprazole 40 mg twice daily.  Her appetite has improved and she reports "I can eat again".  Food still does not taste completely normal to her.  She is not having frequent nausea or vomiting.  Bowel movements are working better for her as long as she uses Linzess.  On dialysis days she waits until after her dialysis session to use the Linzess.  No blood in stool or melena.  No abdominal pain.   Review of Systems As per HPI, otherwise negative  Current Medications, Allergies, Past Medical History, Past Surgical History, Family  History and Social History were reviewed in [employer] record.    Objective:   Physical Exam BP (!) 108/48    Pulse (!) 56    Ht 5\' 4"  (1.626 m)    Wt 172 lb 12.8 oz (78.4 kg)    BMI 29.66 kg/m  Gen: awake, alert, NAD HEENT: anicteric, op clear CV: RRR, no mrg Pulm: CTA b/l Abd: soft, NT/ND, +BS throughout Ext: no c/c/e Neuro: nonfocal     Assessment & Plan:  76 year old female with a history of tubulovillous adenoma of the duodenum status post EMR, GERD, H. pylori negative gastritis, subcentimeter hyperplastic gastric polyps, history of multiple adenomatous colon polyps, bleeding splenic flexure AVM/aberrant vessel summer 2022, ESRD on hemodialysis, atrial fibrillation on warfarin who is here for follow-up.   History of tubulovillous adenoma of the duodenum --surveyed recently at upper endoscopy with biopsies and no evidence of recurrent adenoma. --Consider endoscopic surveillance 2024  2.  GERD/dyspepsia --symptoms resolved with twice daily PPI.  She is tolerating this medication well we will continue pantoprazole 40 mg twice daily AC  3.  Esophageal dysmotility --she recalls prior esophageal testing in [place] led to the same conclusion.  She did improve with esophageal dilation several months ago.  Her residual symptoms or not structural but more likely related to intermittent dysmotility.  She reports the symptoms are mild and does not wish for any further evaluation or therapy at this time.  4.  Chronic constipation --continue Linzess 145 mcg daily  5.  History of  multiple adenomatous colon polyps --surveillance colonoscopy around April 2024; consider upper endoscopy at the same time as per #1.  The decision to repeat both endoscopic procedures will be based on overall health at that time

## 2021-08-07 NOTE — Patient Instructions (Addendum)
If you are age 78 or older, your body mass index should be between 23-30. Your Body mass index is 29.66 kg/m. If this is out of the aforementioned range listed, please consider follow up with your Primary Care Provider.  If you are age 33 or younger, your body mass index should be between 19-25. Your Body mass index is 29.66 kg/m. If this is out of the aformentioned range listed, please consider follow up with your Primary Care Provider.   __________________________________________________________  The [facility] providers would like to encourage you to use [facility] to communicate with providers for non-urgent requests or questions.  Due to long hold times on the telephone, sending your provider a message by [facility] may be a faster and more efficient way to get a response.  Please allow 48 business hours for a response.  Please remember that this is for non-urgent requests.   Due to recent changes in healthcare laws, you may see the results of your imaging and laboratory studies on MyChart before your provider has had a chance to review them.  We understand that in some cases there may be results that are confusing or concerning to you. Not all laboratory results come back in the same time frame and the provider may be waiting for multiple results in order to interpret others.  Please give [place] 48 hours in order for your provider to thoroughly review all the results before contacting the office for clarification of your results.   We have sent the following medications to your pharmacy for you to pick up at your convenience: Linzess, Pantoprazole  Please follow up in 1 year. We will contact you to schedule for this appointment.  Thank you for choosing me and [facility].  Dr. Hilarie Fredrickson

## 2021-08-09 ENCOUNTER — Telehealth: Payer: Self-pay | Admitting: Pharmacist

## 2021-08-09 DIAGNOSIS — N2581 Secondary hyperparathyroidism of renal origin: Secondary | ICD-10-CM | POA: Diagnosis not present

## 2021-08-09 DIAGNOSIS — N186 End stage renal disease: Secondary | ICD-10-CM | POA: Diagnosis not present

## 2021-08-09 DIAGNOSIS — Z992 Dependence on renal dialysis: Secondary | ICD-10-CM | POA: Diagnosis not present

## 2021-08-09 NOTE — Chronic Care Management (AMB) (Signed)
° ° °  Chronic Care Management Pharmacy Assistant   Name: Jane [name]  MRN: [id number] DOB: 1945/11/12  08/13/2021 APPOINTMENT REMINDER   Called Jane [name] reminded to have all medications, supplements and any blood glucose and blood pressure readings available for review with Jane [name], Pharm. D, at her telephone visit on 0124/2023 at 9:00.   Questions: Have you had any recent office visit or specialist visit outside of [place]?  Patient denies any recent visits outside of [facility]  Are there any concerns you would like to discuss during your office visit?  Patient denies any concerns   Are you having any problems obtaining your medications? (Whether it pharmacy issues or cost)  Patient denies any issues getting medications  If patient has any PAP medications ask if they are having any problems getting their PAP medication or refill?  Patient denies any meds filled through PAP  Care Gaps: AWV - scheduled for 08/06/2021 Last BP - 145/73 on 08/02/2021 Shingrix - never done Covid vaccine - overdue  Star Rating Drug: Atorvastatin 10 mg - last filled 07/18/2021 30 DS at [facility]  Any gaps in medications fill history?  No  Gennie Alma [facility]  Catering manager [phone]

## 2021-08-12 DIAGNOSIS — N2581 Secondary hyperparathyroidism of renal origin: Secondary | ICD-10-CM | POA: Diagnosis not present

## 2021-08-12 DIAGNOSIS — N186 End stage renal disease: Secondary | ICD-10-CM | POA: Diagnosis not present

## 2021-08-12 DIAGNOSIS — Z992 Dependence on renal dialysis: Secondary | ICD-10-CM | POA: Diagnosis not present

## 2021-08-13 ENCOUNTER — Other Ambulatory Visit: Payer: Self-pay

## 2021-08-13 ENCOUNTER — Telehealth: Payer: Self-pay | Admitting: Internal Medicine

## 2021-08-13 ENCOUNTER — Telehealth: Payer: Self-pay | Admitting: Pharmacist

## 2021-08-13 ENCOUNTER — Ambulatory Visit (INDEPENDENT_AMBULATORY_CARE_PROVIDER_SITE_OTHER): Payer: Medicare Other | Admitting: Pharmacist

## 2021-08-13 DIAGNOSIS — I480 Paroxysmal atrial fibrillation: Secondary | ICD-10-CM

## 2021-08-13 DIAGNOSIS — K219 Gastro-esophageal reflux disease without esophagitis: Secondary | ICD-10-CM

## 2021-08-13 DIAGNOSIS — I6523 Occlusion and stenosis of bilateral carotid arteries: Secondary | ICD-10-CM

## 2021-08-13 MED ORDER — ATORVASTATIN CALCIUM 10 MG PO TABS: 10.0000 mg | ORAL_TABLET | Freq: Every day | ORAL | 3 refills | Status: DC

## 2021-08-13 MED ORDER — PANTOPRAZOLE SODIUM 40 MG PO TBEC: 40.0000 mg | DELAYED_RELEASE_TABLET | Freq: Two times a day (BID) | ORAL | 2 refills | Status: DC

## 2021-08-13 MED ORDER — LINACLOTIDE 145 MCG PO CAPS: ORAL_CAPSULE | ORAL | 2 refills | Status: DC

## 2021-08-13 NOTE — Chronic Care Management (AMB) (Addendum)
° ° °  Chronic Care Management Pharmacy Assistant   Name: Jane [name]  MRN: [id number] DOB: September 14, 1945  Reason for Encounter: Medication Review / [facility] Onboard   Conditions to be addressed/monitored: HTN and GERD  Recent office visits:  None  Recent consult visits:  None  Hospital visits:  None  Medications: Outpatient Encounter Medications as of 08/13/2021  Medication Sig   atorvastatin (LIPITOR) 10 MG tablet Take 1 tablet (10 mg total) by mouth daily.   B Complex-C-Zn-Folic Acid (DIALYVITE 287 WITH ZINC) 0.8 MG TABS Take 1 tablet by mouth daily.   CALCITRIOL PO Take by mouth 3 (three) times a week.   ethyl chloride spray Apply 1 application topically every Monday, Wednesday, and Friday with hemodialysis.   linaclotide (LINZESS) 145 MCG CAPS capsule TAKE 1 CAPSULE BY MOUTH ONCE DAILY BEFORE BREAKFAST   midodrine (PROAMATINE) 10 MG tablet Take 10 mg by mouth 3 (three) times daily as needed (low BP and dizziness and lightheadedness symptoms.).   pantoprazole (PROTONIX) 40 MG tablet Take 1 tablet (40 mg total) by mouth 2 (two) times daily. (Patient taking differently: Take 40 mg by mouth as needed.)   warfarin (COUMADIN) 5 MG tablet USE AS DIRECTED BY COUMADIN CLINIC (Patient taking differently: Take 2.5-5 mg by mouth See admin instructions. Currently on: 5 mg every Mon, Wed; 2.5 mg all other days)   No facility-administered encounter medications on file as of 08/13/2021.    Referred by: [name], [name], MD  Reason for referral: Chronic care management  Reviewed chart for medication changes ahead of medication coordination call.  BP Readings from Last 3 Encounters:  08/07/21 (!) 108/48  08/06/21 130/60  08/02/21 (!) 145/73    No results found for: HGBA1C   Verbal consent obtained for [facility] enhanced pharmacy services (medication synchronization, adherence packaging, delivery coordination). A medication sync plan was created to allow patient to get all medications  delivered once every 30 to 90 days per patient preference. Patient understands they have freedom to choose pharmacy and clinical pharmacist will coordinate care between all prescribers and [facility].  Patient requested to obtain medications through V[facility]  30 Days   Med Sync Plan: Atorvastatin 10mg   Adrian Prows, MD  33 DS September 15, 2021 Linzess 145 mcg   Jerene Bears, MD              [place] DS November 16, 2021 Pantoprazole - PRN Jerene Bears, MD      August 29, 2021 Ethyl Chloride Spray  use PRN  needs this soon  Midodrine 10 mg PRN     35 tabs August 24, 2021  Warfarin 5 mg as directed   115 tabs Nov 28, 2021    Patient will need a short fill of Ethyl Chloride and Pantoprazole, prior to adherence delivery.  Prescriptions requested from PCP and specialists. Request for Linzess and Pantoprazole made with Dr Vena Rua assistant Abbie Sons. Request for Atrovastatin made with Dr. Irven Shelling assistant Magda Paganini.  Care Gaps: AWV - scheduled for 08/06/2021 Last BP - 145/73 on 08/02/2021 Shingrix - never done Covid vaccine - overdue  Star Rating Drugs: Atorvastatin 10 mg - last filled 07/18/2021 30 DS at [place] Pharmacist Assistant [phone]

## 2021-08-13 NOTE — Progress Notes (Signed)
Chronic Care Management Pharmacy Note  08/13/2021 Name:  Jane [name] MRN:  [id number] DOB:  Dec 13, 1945  Summary: BP is still running on the lower side Pt does not have much of an appetite  Recommendations/Changes made from today's visit: -Transition to [facility] for delivery -Consider appetite stimulant if pt reconsiders -Recommended continued BP monitoring at home  Plan: Follow up in 4 months   Subjective: Jane [name] is an 76 y.o. year old female who is a primary patient of [name], [name], MD.  The CCM team was consulted for assistance with disease management and care coordination needs.    Engaged with patient face to face for initial visit in response to provider referral for pharmacy case management and/or care coordination services.   Consent to Services:  The patient was given the following information about Chronic Care Management services today, agreed to services, and gave verbal consent: 1. CCM service includes personalized support from designated clinical staff supervised by the primary care provider, including individualized plan of care and coordination with other care providers 2. 24/7 contact phone numbers for assistance for urgent and routine care needs. 3. Service will only be billed when office clinical staff spend 20 minutes or more in a month to coordinate care. 4. Only one practitioner may furnish and bill the service in a calendar month. 5.The patient may stop CCM services at any time (effective at the end of the month) by phone call to the office staff. 6. The patient will be responsible for cost sharing (co-pay) of up to 20% of the service fee (after annual deductible is met). Patient agreed to services and consent obtained.  Patient Care Team: [name], [name], MD as PCP - General (Family Medicine) Sidonie Dickens, MD as Referring Physician (Psychiatry) [facility] [place], [place], [facility] as Pharmacist (Pharmacist)  Recent  office visits: 08/06/21 Glenna Durand, LPN: Patient presented for AWV.  05/14/2021 [name] MD - Patient was seen for hypertension with renal disease and additional issues. No medication changes. Follow up in 6 months.  Recent consult visits: 08/07/21 Zenovia Jarred, MD (gastro): Patient presented for constipation follow up.  08/01/21 [name], [facility] (cardiology): Patient presented for anti-coag visit. INR 1.6, goal 2-3. Boosted x 1 dose and increased to 2.5 mg (5 mg x 0.5) every Tue; 5 mg (5 mg x 1) all other days. Recheck in 2 weeks.  07/23/2021 [name] MD (Physical medicine and rehabilitation) - Patient was seen for spondylosis without myelopathy or rediculopathy, lumbar region. Discontinued Hydralazine. No follow up noted.   07/18/2021 Adrian Prows MD (cardiology) - Patient was seen for atrial fibrillation. Started Atorvastatin 10 mg daily. Discontinued Amiodarone, Carvedilol, Diltiazem, Duloxetine and Isosorbide. Follow up in 1 year.   06/27/2021 [name] MD (Physical medicine and rehabilitation) - Patient was seen for chronic left sacroiliac pain and additional issue. No medication changes. Follow up in 4 weeks.    05/22/2021 Sherrilyn Rist MD (pulmonary) - Patient was seen for obstructive sleep apnea. Discontinued Albuterol and Amlodipine. Follow up in 3-4 months.   05/08/2021 Justice Britain MD (GI) - Patient was seen for dysphagia and additional issues. Discontinued Fluconazole. No follow up noted.    05/07/2021 [name] MD (Physical medicine and rehabilitation) - Patient was seen for chronic left sacroiliac pain and additional issue. No medication changes. Follow up in 4 weeks.    02/28/2021 [name] MD (Physical medicine and rehabilitation) - Patient was seen for lumbar post laminectomy syndrome  and additional issue. Started tramadol 50 mg three times daily.    02/21/2021 Lanae Crumbly DPM (podiatry) - Patient was seen for posterior tibial tendinitis  of left lower extremity and additional issues.  Hospital visits: Patient was seen at [facility] ED on 04/29/2021 due to lumbar radiculopathy. Discharged after 7 hours.  New?Medications Started at [facility] Discharge:?? Cyclobenzaprine 5 mg three times daily Norco 5/325 mg every 6 hours as needed.  Medication Changes at Hospital Discharge: No medication changes. Medications Discontinued at Hospital Discharge: No medications discontinued. Medications that remain the same after Hospital Discharge:??  -All other medications will remain the same.    Objective:  Lab Results  Component Value Date   CREATININE 9.77 (H) 04/29/2021   BUN 36 (H) 04/29/2021   GFR 4.82 (LL) 11/20/2020   GFRNONAA 4 (L) 04/29/2021   GFRAA 4 (L) 01/18/2020   NA 137 04/29/2021   K 3.3 (L) 04/29/2021   CALCIUM 9.8 04/29/2021   CO2 26 04/29/2021   GLUCOSE 110 (H) 04/29/2021    Lab Results  Component Value Date/Time   GFR 4.82 (LL) 11/20/2020 10:25 AM   GFR 8.87 (LL) 11/21/2019 04:00 PM    Last diabetic Eye exam: No results found for: HMDIABEYEEXA  Last diabetic Foot exam: No results found for: HMDIABFOOTEX   Lab Results  Component Value Date   CHOL 198 10/06/2019   HDL 53 10/06/2019   LDLCALC 135 (H) 10/06/2019   TRIG 51 10/06/2019   CHOLHDL 3.7 10/06/2019    Hepatic Function Latest Ref Rng & Units 02/01/2021 01/31/2021 11/24/2020  Total Protein 6.5 - 8.1 g/dL - - 7.3  Albumin 3.5 - 5.0 g/dL 3.0(L) 3.1(L) 3.3(L)  AST 15 - 41 U/L - - 17  ALT 0 - 44 U/L - - 15  Alk Phosphatase 38 - 126 U/L - - 100  Total Bilirubin 0.3 - 1.2 mg/dL - - 0.6    Lab Results  Component Value Date/Time   TSH 1.36 11/20/2020 10:25 AM   TSH 0.351 01/13/2020 07:33 AM   TSH 1.129 10/06/2019 03:10 PM   TSH 0.94 09/22/2018 10:32 AM    CBC Latest Ref Rng & Units 04/29/2021 02/03/2021 02/02/2021  WBC 4.0 - 10.5 K/uL 5.4 6.0 6.3  Hemoglobin 12.0 - 15.0 g/dL 11.8(L) 9.9(L) 9.4(L)  Hematocrit 36.0 - 46.0 %  39.4 31.9(L) 30.4(L)  Platelets 150 - 400 K/uL 213 241 240    No results found for: VD25OH  Clinical ASCVD: Yes  The 10-year ASCVD risk score (Arnett DK, et al., 2019) is: 14.9%   Values used to calculate the score:     Age: 9 years     Sex: Female     Is Non-Hispanic African American: Yes     Diabetic: No     Tobacco smoker: No     Systolic Blood Pressure: 751 mmHg     Is BP treated: No     HDL Cholesterol: 53 mg/dL     Total Cholesterol: 198 mg/dL    Depression screen [facility] 2/9 08/06/2021 08/02/2021 06/27/2021  Decreased Interest 0 0 1  Down, Depressed, Hopeless 0 0 1  PHQ - 2 Score 0 0 2  Altered sleeping - - -  Tired, decreased energy - - -  Change in appetite - - -  Feeling bad or failure about yourself  - - -  Trouble concentrating - - -  Moving slowly or fidgety/restless - - -  Suicidal thoughts - - -  PHQ-9 Score - - -  Difficult doing work/chores - - -  Some recent data might be hidden     CHA2DS2/VAS Stroke Risk Points  Current as of 4 days ago     4 >= 2 Points: High Risk  1 - 1.99 Points: Medium Risk  0 Points: Low Risk    No Change      Details    This score determines the patient's risk of having a stroke if the  patient has atrial fibrillation.       Points Metrics  0 Has Congestive Heart Failure:  No    Current as of 4 days ago  0 Has Vascular Disease:  No    Current as of 4 days ago  1 Has Hypertension:  Yes    Current as of 4 days ago  2 Age:  4    Current as of 4 days ago  0 Has Diabetes:  No    Current as of 4 days ago  0 Had Stroke:  No  Had TIA:  No  Had Thromboembolism:  No    Current as of 4 days ago  1 Female:  Yes    Current as of 4 days ago       Social History   Tobacco Use  Smoking Status Never  Smokeless Tobacco Never   BP Readings from Last 3 Encounters:  08/07/21 (!) 108/48  08/06/21 130/60  08/02/21 (!) 145/73   Pulse Readings from Last 3 Encounters:  08/07/21 (!) 56  08/06/21 70  08/02/21 67   Wt Readings  from Last 3 Encounters:  08/07/21 172 lb 12.8 oz (78.4 kg)  08/06/21 182 lb 8 oz (82.8 kg)  08/02/21 179 lb (81.2 kg)   BMI Readings from Last 3 Encounters:  08/07/21 29.66 kg/m  08/06/21 31.33 kg/m  08/02/21 29.79 kg/m    Assessment/Interventions: Review of patient past medical history, allergies, medications, health status, including review of consultants reports, laboratory and other test data, was performed as part of comprehensive evaluation and provision of chronic care management services.   SDOH:  (Social Determinants of Health) assessments and interventions performed: Yes SDOH Interventions    Flowsheet Row Most Recent Value  SDOH Interventions   Financial Strain Interventions Intervention Not Indicated  Transportation Interventions Other (Comment)  [getting her set up with delivery pharmacy]      SDOH Screenings   Alcohol Screen: Not on file  Depression (PHQ2-9): Low Risk    PHQ-2 Score: 0  Financial Resource Strain: Low Risk    Difficulty of Paying Living Expenses: Not very hard  Food Insecurity: No Food Insecurity   Worried About Charity fundraiser in the Last Year: Never true   Ran Out of Food in the Last Year: Never true  Housing: Not on file  Physical Activity: Inactive   Days of Exercise per Week: 0 days   Minutes of Exercise per Session: 0 min  Social Connections: Not on file  Stress: No Stress Concern Present   Feeling of Stress : Not at all  Tobacco Use: Low Risk    Smoking Tobacco Use: Never   Smokeless Tobacco Use: Never   Passive Exposure: Not on file  Transportation Needs: Unmet Transportation Needs   Lack of Transportation (Medical): No   Lack of Transportation (Non-Medical): Yes   Patient reports she deals with back pain all the time and back pain. Patient is feeling tired and sick to her stomach and doesn't feel like she has control when she gets up.  She gets really low BP readings. She cannot see and feels nauseous when she has really low  readings and thinks this has been going on since Sept of last year. Patient reports her BP cuff readings are automatically going to her cardiologist office.  Patient goes to dialysis on Mon, Wed and Fri and takes public transportation for this. Patient usually stays in bed throughout the day and is living alone. Patient is afraid to go places by herself for fear of passing out. Patient's daughter lives in [place] and tries to help out some and she traveling for work. Patient sees her daughter on the weekends or calls her to check in on her.  Patient does not sleep well. She could not recall the last time she had a good night's sleep. She feels tired in the morning and takes her a while to get up and ready. Patient reports she went to sleep study and they recommended that she sleeps with oxygen. Patient cannot afford to pay for that.   Patient did qualify for food stamps. Patient has medicare extra help. Patient reports the spray is not covered by insurance, she has to pay for it out of pocket and it is the most expensive for her.  Patient reports transportation is an issue with getting her medications.   CCM Care Plan  Allergies  Allergen Reactions   Latex Rash   Penicillins Other (See Comments)    Yeast infection / Childhood   Sulfa Antibiotics Rash   Tape Other (See Comments)    Plastic, silicone, and paper tape causes bruising and pulls off skin. Cloth tape works fine    Medications Reviewed Today     Reviewed by Viona Gilmore, [facility] (Pharmacist) on 08/13/21 at [phone]  Med List Status: <None>   Medication Order Taking? Sig Documenting Provider Last Dose Status Informant  atorvastatin (LIPITOR) 10 MG tablet [id number] Yes Take 1 tablet (10 mg total) by mouth daily. Adrian Prows, MD Taking Active   B Complex-C-Zn-Folic Acid (DIALYVITE 974 WITH ZINC) 0.8 MG TABS [id number] Yes Take 1 tablet by mouth daily. [provider] Taking Active Self           Med Note Lenor Derrick Oct 15, 2020  1:55 PM)    CALCITRIOL PO [id number] Yes Take by mouth 3 (three) times a week. [provider] Taking Active   ethyl chloride spray [id number] Yes Apply 1 application topically every Monday, Wednesday, and Friday with hemodialysis. [provider] Taking Active Self  linaclotide Rolan Lipa) 145 MCG CAPS capsule [id number] Yes TAKE 1 CAPSULE BY MOUTH ONCE DAILY BEFORE BREAKFAST Pyrtle, Lajuan Lines, MD Taking Active   midodrine (PROAMATINE) 10 MG tablet [id number] Yes Take 10 mg by mouth 3 (three) times daily as needed (low BP and dizziness and lightheadedness symptoms.). [provider] Taking Active   pantoprazole (PROTONIX) 40 MG tablet [id number] Yes Take 1 tablet (40 mg total) by mouth 2 (two) times daily.  Patient taking differently: Take 40 mg by mouth as needed.   Jerene Bears, MD Taking Active   warfarin (COUMADIN) 5 MG tablet [id number] Yes USE AS DIRECTED BY COUMADIN CLINIC  Patient taking differently: Take 2.5-5 mg by mouth See admin instructions. Currently on: 5 mg every Mon, Wed; 2.5 mg all other days   [name], [name], MD Taking Active Self           Med Note Broadus John, Erma Pinto   Fri Feb 15, 2021  5:10  PM)    Med List Note Gerlene Burdock 06/25/16 2051): Dialysis days: Mon/Wed/Fri at: [place], [place], [place] [address]  [phone]            Patient Active Problem List   Diagnosis Date Noted   OSA (obstructive sleep apnea) 07/19/2021   Atherosclerosis of aorta ([place]) 05/13/2021   Unintentional weight loss 05/08/2021   Dysphagia 05/08/2021   Chronic left sacroiliac pain 05/07/2021   GI bleed 01/31/2021   Gastric intestinal metaplasia 10/18/2020   Dyspepsia 10/18/2020   Chronic constipation 10/18/2020   Nausea without vomiting 05/29/2020   Generalized osteoarthritis of multiple sites 03/27/2020   Chronic pain disorder 03/27/2020   Acute GI bleeding 01/13/2020   Duodenal ulcer  with hemorrhage    Lower GI bleed 01/12/2020   Monitoring for long-term anticoagulant use 01/03/2020   Intestinal metaplasia of gastric mucosa 12/04/2019   Gastric polyp 12/04/2019   Tubulovillous adenoma of small intestine 12/03/2019   Adenomatous duodenal polyp 12/03/2019   Abnormal endoscopy of upper gastrointestinal tract 12/03/2019   Depression, major, recurrent, moderate ([place]) 09/22/2018   Orthostatic hypotension 09/16/2018   Bilateral lower extremity edema 08/24/2018   Abdominal pain, diffuse 05/11/2018   Body mass index 40.0-44.9, adult ([place]) 03/18/2018   Unilateral primary osteoarthritis, right knee 03/18/2018   Chronic pain of right knee 03/18/2018   Abnormal facial hair 11/10/2017   Chronic anticoagulation 11/10/2017   GERD (gastroesophageal reflux disease) 06/16/2017   Constipation 10/16/2016   Morbid obesity ([place]) 10/16/2016   Diverticulitis of large intestine with perforation 06/26/2016   Anemia in other chronic diseases classified elsewhere 06/25/2016   Chest pain with moderate risk for cardiac etiology 06/25/2016   ESRD on hemodialysis ([place]) 06/30/2012   Paroxysmal atrial fibrillation ([place]) 06/30/2012   Hypertension with renal disease 06/30/2012    Immunization History  Administered Date(s) Administered   Influenza, High Dose Seasonal PF 04/08/2019, 05/07/2020, 04/26/2021   Influenza,inj,quad, With Preservative 04/12/2018   Influenza-Unspecified 04/20/2016, 05/21/2018   PFIZER(Purple Top)SARS-COV-2 Vaccination 09/03/2019, 09/25/2019, 05/26/2020   Pneumococcal Conjugate-13 07/20/2015   Pneumococcal Polysaccharide-23 10/12/2012   Tdap 06/20/2015    Conditions to be addressed/monitored:  Hypertension, Hyperlipidemia, Atrial Fibrillation, GERD, and Constipation  Care Plan : CCM Pharmacy Care Plan  Updates made by Viona Gilmore, [place] since 08/13/2021 12:00 AM     Problem: Problem: Hypotension, Hyperlipidemia, Atrial Fibrillation, GERD, and Constipation       Long-Range Goal: Patient-Specific Goal   Start Date: 08/13/2021  Expected End Date: 08/14/2021  This Visit's Progress: On track  Priority: High  Note:   Current Barriers:  Unable to independently afford treatment regimen Unable to maintain control of low blood pressure  Pharmacist Clinical Goal(s):  Patient will achieve adherence to monitoring guidelines and medication adherence to achieve therapeutic efficacy maintain control of blood pressure as evidenced by home blood pressure readings  through collaboration with PharmD and provider.   Interventions: 1:1 collaboration with [name], [name], MD regarding development and update of comprehensive plan of care as evidenced by provider attestation and co-signature Inter-disciplinary care team collaboration (see longitudinal plan of care) Comprehensive medication review performed; medication list updated in electronic medical record  Hypotension (BP goal > 110/60) -Not ideally controlled -Current treatment: Midodrine 10 mg 1 tablet three times daily as needed -Medications previously tried: none  -Current home readings: 117/60; been more on the lower side lately -Current dietary habits: not eating any salt -Current exercise habits: not  able to exercise -Reports hypotensive/hypertensive symptoms -Educated on Importance of home blood pressure monitoring; Proper BP monitoring technique; Symptoms of hypotension and importance of maintaining adequate hydration; -Counseled to monitor BP at home daily, document, and provide log at future appointments -Recommended to continue current medication  Hyperlipidemia: (LDL goal < 100) -Uncontrolled -Current treatment: Atorvastatin 10 mg 1 tablet daily - Appropriate, Query effective, Safe, Accessible   -Medications previously tried: none  -Current dietary patterns: cannot eat red meat, chicken or fish (makes her sick); cannot do many fruits and vegetables  -Current exercise habits: unable to  exercise -Educated on Cholesterol goals;  Benefits of statin for ASCVD risk reduction; Importance of limiting foods high in cholesterol; -Counseled on diet and exercise extensively Recommended to continue current medication Recommended repeat lipid panel.  Atrial Fibrillation (Goal: prevent stroke and major bleeding) -Controlled -CHADSVASC: 4 -Current treatment: Rate control: none Anticoagulation: warfarin 5 mg as directed by coumadin clinic -Medications previously tried: amiodarone -Home BP and HR readings: BP readings go directly to cardiology -Counseled on increased risk of stroke due to Afib and benefits of anticoagulation for stroke prevention; bleeding risk associated with warfarin and importance of self-monitoring for signs/symptoms of bleeding; avoidance of NSAIDs due to increased bleeding risk with anticoagulants; -Recommended to continue current medication  GERD (Goal: minimize symptoms) -Controlled -Current treatment  Pantoprazole 40 mg 1 tablet twice daily - taking as needed - Appropriate, Effective, Safe, Accessible -Medications previously tried: none  -Counseled on non-pharmacologic management of symptoms such as elevating the head of your bed, avoiding eating 2-3 hours before bed, avoiding triggering foods such as acidic, spicy, or fatty foods, eating smaller meals, and wearing clothes that are loose around the waist Recommended as needed use of pantoprazole.  Constipation (Goal: minimize symptoms) -Controlled -Current treatment  Linzess 145 mcg 1 capsule once daily - Appropriate, Effective, Safe, Accessible -Medications previously tried: stool softeners  -Recommended to continue current medication   Health Maintenance -Vaccine gaps: shingrix, COVID booster -Current therapy:  Calcitriol three times weekly (takes at dialysis) Ethyl chloride spray (on dialysis days) Dialyvite 0.8 mg 1 tablet up to three times daily (taking every time she eats; gets it through  dialysis pharmacy) -Educated on Cost vs benefit of each product must be carefully weighed by individual consumer -Patient is satisfied with current therapy and denies issues -Recommended to continue current medication  Patient Goals/Self-Care Activities Patient will:  - take medications as prescribed as evidenced by patient report and record review check blood pressure daily, document, and provide at future appointments  Follow Up Plan: Telephone follow up appointment with care management team member scheduled for: 4 months       Medication Assistance: None required.  Patient affirms current coverage meets needs.  Compliance/Adherence/Medication fill history: Care Gaps: Shingrix, COVID booster Last BP - 145/73 on 08/02/2021  Star-Rating Drugs: Atorvastatin 10 mg - last filled 07/18/2021 30 DS at [facility]  Patient's preferred pharmacy is:  [facility] [address], [place] - [place] [address] [place] [address] Phone: [phone] Fax: [phone]  Uses pill box? No - not taking many medications scheduled Pt endorses 95% compliance  We discussed: Benefits of medication synchronization, packaging and delivery as well as enhanced pharmacist oversight with [facility]. Patient decided to: Utilize [facility] pharmacy for medication synchronization, packaging and delivery  Care Plan and Follow Up Patient Decision:  Patient agrees to Care Plan and Follow-up.  Plan: Telephone follow up appointment with care management team member scheduled for:  4  months  Jeni Salles, PharmD, [place] Pharmacist [place] at [place]

## 2021-08-13 NOTE — Telephone Encounter (Signed)
Inbound call from [facility]. Stating they need the prescription for Linzess and Pantoprazole sent electronic or fax to [phone] for patient to have on file as her new pharmacy

## 2021-08-13 NOTE — Telephone Encounter (Signed)
Rx sent 

## 2021-08-13 NOTE — Patient Instructions (Signed)
Hi Adyline,  It was great to get to meet you over the telephone! Below is a summary of some of the topics we discussed.   Please reach out to me if you have any questions or need anything before our follow up!  Best, Maddie  Jeni Salles, PharmD, [place] [facility] [place]   Visit Information   Goals Addressed   None    Patient Care Plan: CCM Pharmacy Care Plan     Problem Identified: Problem: Hypotension, Hyperlipidemia, Atrial Fibrillation, GERD, and Constipation      Long-Range Goal: Patient-Specific Goal   Start Date: 08/13/2021  Expected End Date: 08/14/2021  This Visit's Progress: On track  Priority: High  Note:   Current Barriers:  Unable to independently afford treatment regimen Unable to maintain control of low blood pressure  Pharmacist Clinical Goal(s):  Patient will achieve adherence to monitoring guidelines and medication adherence to achieve therapeutic efficacy maintain control of blood pressure as evidenced by home blood pressure readings  through collaboration with PharmD and provider.   Interventions: 1:1 collaboration with [name], [name], MD regarding development and update of comprehensive plan of care as evidenced by provider attestation and co-signature Inter-disciplinary care team collaboration (see longitudinal plan of care) Comprehensive medication review performed; medication list updated in electronic medical record  Hypotension (BP goal > 110/60) -Not ideally controlled -Current treatment: Midodrine 10 mg 1 tablet three times daily as needed -Medications previously tried: none  -Current home readings: 117/60; been more on the lower side lately -Current dietary habits: not eating any salt -Current exercise habits: not able to exercise -Reports hypotensive/hypertensive symptoms -Educated on Importance of home blood pressure monitoring; Proper BP monitoring technique; Symptoms of hypotension  and importance of maintaining adequate hydration; -Counseled to monitor BP at home daily, document, and provide log at future appointments -Recommended to continue current medication  Hyperlipidemia: (LDL goal < 100) -Uncontrolled -Current treatment: Atorvastatin 10 mg 1 tablet daily - Appropriate, Query effective, Safe, Accessible   -Medications previously tried: none  -Current dietary patterns: cannot eat red meat, chicken or fish (makes her sick); cannot do many fruits and vegetables  -Current exercise habits: unable to exercise -Educated on Cholesterol goals;  Benefits of statin for ASCVD risk reduction; Importance of limiting foods high in cholesterol; -Counseled on diet and exercise extensively Recommended to continue current medication Recommended repeat lipid panel.  Atrial Fibrillation (Goal: prevent stroke and major bleeding) -Controlled -CHADSVASC: 4 -Current treatment: Rate control: none Anticoagulation: warfarin 5 mg as directed by coumadin clinic -Medications previously tried: amiodarone -Home BP and HR readings: BP readings go directly to cardiology -Counseled on increased risk of stroke due to Afib and benefits of anticoagulation for stroke prevention; bleeding risk associated with warfarin and importance of self-monitoring for signs/symptoms of bleeding; avoidance of NSAIDs due to increased bleeding risk with anticoagulants; -Recommended to continue current medication  GERD (Goal: minimize symptoms) -Controlled -Current treatment  Pantoprazole 40 mg 1 tablet twice daily - taking as needed - Appropriate, Effective, Safe, Accessible -Medications previously tried: none  -Counseled on non-pharmacologic management of symptoms such as elevating the head of your bed, avoiding eating 2-3 hours before bed, avoiding triggering foods such as acidic, spicy, or fatty foods, eating smaller meals, and wearing clothes that are loose around the waist Recommended as needed use of  pantoprazole.  Constipation (Goal: minimize symptoms) -Controlled -Current treatment  Linzess 145 mcg 1 capsule once daily - Appropriate, Effective, Safe, Accessible -Medications previously tried: stool softeners  -  Recommended to continue current medication   Health Maintenance -Vaccine gaps: shingrix, COVID booster -Current therapy:  Calcitriol three times weekly (takes at dialysis) Ethyl chloride spray (on dialysis days) Dialyvite 0.8 mg 1 tablet up to three times daily (taking every time she eats; gets it through dialysis pharmacy) -Educated on Cost vs benefit of each product must be carefully weighed by individual consumer -Patient is satisfied with current therapy and denies issues -Recommended to continue current medication  Patient Goals/Self-Care Activities Patient will:  - take medications as prescribed as evidenced by patient report and record review check blood pressure daily, document, and provide at future appointments  Follow Up Plan: Telephone follow up appointment with care management team member scheduled for: 4 months      Ms. Sillas was given information about Chronic Care Management services including:  CCM service includes personalized support from designated clinical staff supervised by her physician, including individualized plan of care and coordination with other care providers 24/7 contact phone numbers for assistance for urgent and routine care needs. Standard insurance, coinsurance, copays and deductibles apply for chronic care management only during months in which we provide at least 20 minutes of these services. Most insurances cover these services at 100%, however patients may be responsible for any copay, coinsurance and/or deductible if applicable. This service may help you avoid the need for more expensive face-to-face services. Only one practitioner may furnish and bill the service in a calendar month. The patient may stop CCM services at any time  (effective at the end of the month) by phone call to the office staff.  Patient agreed to services and verbal consent obtained.   The patient verbalized understanding of instructions, educational materials, and care plan provided today and agreed to receive a mailed copy of patient instructions, educational materials, and care plan.  Telephone follow up appointment with pharmacy team member scheduled for: 4 months  Viona Gilmore, [facility]

## 2021-08-14 DIAGNOSIS — N186 End stage renal disease: Secondary | ICD-10-CM | POA: Diagnosis not present

## 2021-08-14 DIAGNOSIS — Z992 Dependence on renal dialysis: Secondary | ICD-10-CM | POA: Diagnosis not present

## 2021-08-14 DIAGNOSIS — N2581 Secondary hyperparathyroidism of renal origin: Secondary | ICD-10-CM | POA: Diagnosis not present

## 2021-08-15 ENCOUNTER — Ambulatory Visit: Payer: Medicare Other | Admitting: Pharmacist

## 2021-08-15 ENCOUNTER — Other Ambulatory Visit: Payer: Self-pay

## 2021-08-15 DIAGNOSIS — I480 Paroxysmal atrial fibrillation: Secondary | ICD-10-CM

## 2021-08-15 DIAGNOSIS — Z7901 Long term (current) use of anticoagulants: Secondary | ICD-10-CM

## 2021-08-15 DIAGNOSIS — Z5181 Encounter for therapeutic drug level monitoring: Secondary | ICD-10-CM | POA: Diagnosis not present

## 2021-08-15 LAB — POCT INR: INR: 1.7 — AB (ref 2.0–3.0)

## 2021-08-15 NOTE — Progress Notes (Signed)
Anticoagulation Management Jane [name] is a 76 y.o. female who reports to the clinic for monitoring of warfarin treatment.    Indication: atrial fibrillation CHA2DS2 Vasc Score 4 (Age >1, female, HTN hx), HAS-BLED 2 (Age>65, renal disease)  Duration: indefinite Supervising physician: Adrian Prows  Anticoagulation Clinic Visit History:  Patient does not report signs/symptoms of bleeding or thromboembolism.  Reports no changes in diet, medications, or lifestyle.   Patient continues to experience complains of dysphagia despite recent esophageal dilation. Evaluated by GI and was recommended to continue monitoring and symptomatic treatment. Was recommended to continue surveillance endoscopy yearly. Pt to continue taking pantoprazole 40 mg BID and Linzess for GERD and constipation.    Pt received bilateral medical branch blocks on 08/02/21 for persistent back pain. Pt reports that her pain and discomfort have improved slightly since. Pt scheduled to follow up on 09/17/21  Pt currently on amiodarone 100 mg qday for now  Pt continues to have soft BP and hypotensive symptoms. Pt started to use scheduled midodrine 10 mg TID over the past 2 weeks. Continues to hold her hydralazine without significant improvement in her symptoms. Pt notes that she feels fatigued, lightheaded, dizzy, nauseous whenever her SBP <120s. Noticed improvement in her nausea, lightheaded and dizziness symptoms since taking it scheduled. Pt recently had her dry weight increased through her nephrologist, but pt states that she hasnt noticed any significant changes in her hypotensive episode frequency or severity. Will continue remote BP monitoring.  Anticoagulation Episode Summary     Current INR goal:  2.0-3.0  TTR:  61.0 % (2.9 y)  Next INR check:  08/29/2021  INR from last check:  1.7 (08/15/2021)  Weekly max warfarin dose:    Target end date:  Indefinite  INR check location:    Preferred lab:    Send INR reminders to:      Indications   Paroxysmal atrial fibrillation (HCC) [I48.0] Monitoring for long-term anticoagulant use [Z51.81 Z79.01]        Comments:           Allergies  Allergen Reactions   Latex Rash   Penicillins Other (See Comments)    Yeast infection / Childhood   Sulfa Antibiotics Rash   Tape Other (See Comments)    Plastic, silicone, and paper tape causes bruising and pulls off skin. Cloth tape works fine    Current Outpatient Medications:    atorvastatin (LIPITOR) 10 MG tablet, Take 1 tablet (10 mg total) by mouth daily., Disp: 30 tablet, Rfl: 3   B Complex-C-Zn-Folic Acid (DIALYVITE 235 WITH ZINC) 0.8 MG TABS, Take 1 tablet by mouth daily., Disp: , Rfl:    CALCITRIOL PO, Take by mouth 3 (three) times a week., Disp: , Rfl:    ethyl chloride spray, Apply 1 application topically every Monday, Wednesday, and Friday with hemodialysis., Disp: , Rfl: 12   linaclotide (LINZESS) 145 MCG CAPS capsule, TAKE 1 CAPSULE BY MOUTH ONCE DAILY BEFORE BREAKFAST, Disp: 90 capsule, Rfl: 2   midodrine (PROAMATINE) 10 MG tablet, Take 10 mg by mouth 3 (three) times daily as needed (low BP and dizziness and lightheadedness symptoms.)., Disp: , Rfl:    pantoprazole (PROTONIX) 40 MG tablet, Take 1 tablet (40 mg total) by mouth 2 (two) times daily., Disp: 180 tablet, Rfl: 2   warfarin (COUMADIN) 5 MG tablet, USE AS DIRECTED BY COUMADIN CLINIC (Patient taking differently: Take 2.5-5 mg by mouth See admin instructions. Currently on: 5 mg every Mon, Wed; 2.5 mg all other  days), Disp: 113 tablet, Rfl: 5 Past Medical History:  Diagnosis Date   Acid reflux    Anemia of chronic disease    Arthritis    Asthma    Atrial fibrillation (HCC)    Bilateral carotid bruits    Complication of anesthesia    "hard to wake up, I have sleep apnea" no CPAP   Diverticulitis    Duodenal ulcer    Dysrhythmia    Afib   ESRD (end stage renal disease) ([place])    MWF [place]   Headache    Heart murmur    History of blood  transfusion    Hypertension    Malaise and fatigue    Orthostatic hypotension    Shortness of breath    " when I walk to fast"   Sleep apnea    Syncope    Tubulovillous adenoma of colon    ASSESSMENT  Recent Results: The most recent result is correlated with 32.5 mg per week:  Lab Results  Component Value Date   INR 1.7 (A) 08/15/2021   INR 1.6 (A) 08/01/2021   INR 1.9 (A) 07/18/2021    Anticoagulation Dosing: Description   INR below goal. Increase weekly dose to 7.5 mg every Thurs, and 5 mg all other days. Recheck INR in 2 weeks.       INR today: Subtherapeutic. INR continues to remain subtherapeutic despite recent weekly dose increase. No significant changes in her INR. Pt denies any missed or skipped weekly warfarin dose. Pt verbalized previously discussed weekly dose. Denies any other relevant changes in diet, medications, or lifestyle. Denies any complains of bleeding or bruising symptoms. Denies any TIA/stroke like symptoms. Unsure for pt's persistently subtherapeutic INR readings. Pt confirmed that she still has a peach colored warfarin at home and hasnt noticed any changes in the color or shape of her home warfarin dose. Will continue aggressive dose titration and close monitoring to ensure INR returns back within therapeutic INR range.Marland Kitchen   PLAN Weekly dose was increased by 15.4% to 37.5 mg/week. Increase weekly dose to 7.5 mg every Thurs and 5 mg all other days. Recheck INR in 2 weeks.   Patient Instructions  INR below goal. Increase weekly dose to 7.5 mg every Thurs, and 5 mg all other days. Recheck INR in 2 weeks.  Patient advised to contact clinic or seek medical attention if signs/symptoms of bleeding or thromboembolism occur.  Patient verbalized understanding by repeating back information and was advised to contact me if further medication-related questions arise.   Follow-up Return in about 2 weeks (around 08/29/2021).  [name], PharmD  15 minutes spent  face-to-face with the patient during the encounter. 50% of time spent on education, including signs/sx bleeding and clotting, as well as food and drug interactions with warfarin. 50% of time was spent on fingerprick POC INR sample collection,processing, results determination, and documentation

## 2021-08-15 NOTE — Patient Instructions (Addendum)
INR below goal. Increase weekly dose to 7.5 mg every Thurs, and 5 mg all other days. Recheck INR in 2 weeks.

## 2021-08-16 DIAGNOSIS — N186 End stage renal disease: Secondary | ICD-10-CM | POA: Diagnosis not present

## 2021-08-16 DIAGNOSIS — Z992 Dependence on renal dialysis: Secondary | ICD-10-CM | POA: Diagnosis not present

## 2021-08-16 DIAGNOSIS — N2581 Secondary hyperparathyroidism of renal origin: Secondary | ICD-10-CM | POA: Diagnosis not present

## 2021-08-19 DIAGNOSIS — N186 End stage renal disease: Secondary | ICD-10-CM | POA: Diagnosis not present

## 2021-08-19 DIAGNOSIS — N2581 Secondary hyperparathyroidism of renal origin: Secondary | ICD-10-CM | POA: Diagnosis not present

## 2021-08-19 DIAGNOSIS — Z992 Dependence on renal dialysis: Secondary | ICD-10-CM | POA: Diagnosis not present

## 2021-08-20 ENCOUNTER — Encounter: Payer: Medicare Other | Admitting: Physical Medicine & Rehabilitation

## 2021-08-20 DIAGNOSIS — E785 Hyperlipidemia, unspecified: Secondary | ICD-10-CM

## 2021-08-20 DIAGNOSIS — I480 Paroxysmal atrial fibrillation: Secondary | ICD-10-CM | POA: Diagnosis not present

## 2021-08-21 ENCOUNTER — Other Ambulatory Visit: Payer: Self-pay

## 2021-08-21 ENCOUNTER — Other Ambulatory Visit: Payer: Self-pay | Admitting: Pharmacist

## 2021-08-21 ENCOUNTER — Telehealth: Payer: Self-pay | Admitting: *Deleted

## 2021-08-21 DIAGNOSIS — I158 Other secondary hypertension: Secondary | ICD-10-CM | POA: Diagnosis not present

## 2021-08-21 DIAGNOSIS — D631 Anemia in chronic kidney disease: Secondary | ICD-10-CM | POA: Diagnosis not present

## 2021-08-21 DIAGNOSIS — Z992 Dependence on renal dialysis: Secondary | ICD-10-CM | POA: Diagnosis not present

## 2021-08-21 DIAGNOSIS — N186 End stage renal disease: Secondary | ICD-10-CM | POA: Diagnosis not present

## 2021-08-21 DIAGNOSIS — N2581 Secondary hyperparathyroidism of renal origin: Secondary | ICD-10-CM | POA: Diagnosis not present

## 2021-08-21 DIAGNOSIS — Z7901 Long term (current) use of anticoagulants: Secondary | ICD-10-CM

## 2021-08-21 DIAGNOSIS — I480 Paroxysmal atrial fibrillation: Secondary | ICD-10-CM

## 2021-08-21 DIAGNOSIS — D509 Iron deficiency anemia, unspecified: Secondary | ICD-10-CM | POA: Diagnosis not present

## 2021-08-21 DIAGNOSIS — Z5181 Encounter for therapeutic drug level monitoring: Secondary | ICD-10-CM

## 2021-08-21 MED ORDER — WARFARIN SODIUM 5 MG PO TABS: 2.5000 mg | ORAL_TABLET | ORAL | 2 refills | Status: DC

## 2021-08-21 MED ORDER — WARFARIN SODIUM 5 MG PO TABS: 5.0000 mg | ORAL_TABLET | Freq: Every day | ORAL | 3 refills | Status: DC

## 2021-08-21 NOTE — Chronic Care Management (AMB) (Addendum)
Spoke with Tammy at the [facility] to request a new script for Midodrine to be sent to [facility].  Tammy will get this faxed to the pharmacy today.   Spoke with Marcha Dutton at [facility] she will be sending a script for Warfarin today.

## 2021-08-21 NOTE — Telephone Encounter (Signed)
° °  Telephone encounter was:  Successful.  08/21/2021 Name: Jane [name] MRN: [id number] DOB: [birth year]  Jane [name] is a 76 y.o. year old female who is a primary care patient of [name], [name], MD . The community resource team was consulted for assistance with Connected with [employer] united [employer] care and her membership is expired , so she had to [employer] for 16.00 for the year and the patients insurance. will pay 100 % of her oxygen , also will apply for food pantry home delivery , [place] 360 referral placed   Care guide performed the following interventions: Patient provided with information about care guide support team and interviewed to confirm resource needs Follow up call placed to community resources to determine status of patients referral.  Follow Up Plan:  Care guide will follow up with patient by phone over the next days  [place], Care Management  [phone] [address] , [place] [address] Email : [email or web address]

## 2021-08-22 NOTE — Chronic Care Management (AMB) (Signed)
Called again to the [facility] Center [phone], spoke with Elberta Fortis to request new prescriptions for Midodrine and Ethyl Chloride Spray to be sent to [facility].  Elberta Fortis states he will get these escribed today.

## 2021-08-23 DIAGNOSIS — D631 Anemia in chronic kidney disease: Secondary | ICD-10-CM | POA: Diagnosis not present

## 2021-08-23 DIAGNOSIS — Z992 Dependence on renal dialysis: Secondary | ICD-10-CM | POA: Diagnosis not present

## 2021-08-23 DIAGNOSIS — D509 Iron deficiency anemia, unspecified: Secondary | ICD-10-CM | POA: Diagnosis not present

## 2021-08-23 DIAGNOSIS — N186 End stage renal disease: Secondary | ICD-10-CM | POA: Diagnosis not present

## 2021-08-23 DIAGNOSIS — N2581 Secondary hyperparathyroidism of renal origin: Secondary | ICD-10-CM | POA: Diagnosis not present

## 2021-08-25 DIAGNOSIS — I480 Paroxysmal atrial fibrillation: Secondary | ICD-10-CM | POA: Diagnosis not present

## 2021-08-25 DIAGNOSIS — I10 Essential (primary) hypertension: Secondary | ICD-10-CM | POA: Diagnosis not present

## 2021-08-26 ENCOUNTER — Telehealth: Payer: Self-pay | Admitting: *Deleted

## 2021-08-26 DIAGNOSIS — D509 Iron deficiency anemia, unspecified: Secondary | ICD-10-CM | POA: Diagnosis not present

## 2021-08-26 DIAGNOSIS — Z992 Dependence on renal dialysis: Secondary | ICD-10-CM | POA: Diagnosis not present

## 2021-08-26 DIAGNOSIS — N2581 Secondary hyperparathyroidism of renal origin: Secondary | ICD-10-CM | POA: Diagnosis not present

## 2021-08-26 DIAGNOSIS — N186 End stage renal disease: Secondary | ICD-10-CM | POA: Diagnosis not present

## 2021-08-26 DIAGNOSIS — D631 Anemia in chronic kidney disease: Secondary | ICD-10-CM | POA: Diagnosis not present

## 2021-08-26 NOTE — Telephone Encounter (Signed)
° °  Telephone encounter was:  Successful.  08/26/2021 Name: Jane [name] MRN: [id number] DOB: 07-06-1946  [name] is a 76 y.o. year old female who is a primary care patient of [name], [name], MD . The community resource team was consulted for assistance with Called patient to let her know all paper and [place] 360 referral has been completed , Social worker Christa See is supposed to reach out 2/9 to see if she qualifies for the giving fund  Care guide performed the following interventions: Patient provided with information about care guide support team and interviewed to confirm resource needs Obtained verbal consent to place patient referral to [place] 360 Follow up call placed to community resources to determine status of patients referral.  Follow Up Plan:  on Friday 08/30/2021  [place], Care Management  [phone] [address] , [place] [address] Email : [email or web address]

## 2021-08-28 ENCOUNTER — Other Ambulatory Visit: Payer: Self-pay

## 2021-08-28 DIAGNOSIS — D509 Iron deficiency anemia, unspecified: Secondary | ICD-10-CM | POA: Diagnosis not present

## 2021-08-28 DIAGNOSIS — Z992 Dependence on renal dialysis: Secondary | ICD-10-CM | POA: Diagnosis not present

## 2021-08-28 DIAGNOSIS — D631 Anemia in chronic kidney disease: Secondary | ICD-10-CM | POA: Diagnosis not present

## 2021-08-28 DIAGNOSIS — I4891 Unspecified atrial fibrillation: Secondary | ICD-10-CM | POA: Diagnosis not present

## 2021-08-28 DIAGNOSIS — N186 End stage renal disease: Secondary | ICD-10-CM

## 2021-08-28 DIAGNOSIS — N2581 Secondary hyperparathyroidism of renal origin: Secondary | ICD-10-CM | POA: Diagnosis not present

## 2021-08-29 ENCOUNTER — Ambulatory Visit: Payer: Medicare Other | Admitting: Pharmacist

## 2021-08-29 ENCOUNTER — Other Ambulatory Visit: Payer: Self-pay

## 2021-08-29 ENCOUNTER — Ambulatory Visit (INDEPENDENT_AMBULATORY_CARE_PROVIDER_SITE_OTHER): Payer: Medicare Other | Admitting: Licensed Clinical Social Worker

## 2021-08-29 DIAGNOSIS — I480 Paroxysmal atrial fibrillation: Secondary | ICD-10-CM

## 2021-08-29 DIAGNOSIS — Z5181 Encounter for therapeutic drug level monitoring: Secondary | ICD-10-CM

## 2021-08-29 DIAGNOSIS — Z7901 Long term (current) use of anticoagulants: Secondary | ICD-10-CM

## 2021-08-29 DIAGNOSIS — Z992 Dependence on renal dialysis: Secondary | ICD-10-CM

## 2021-08-29 DIAGNOSIS — F331 Major depressive disorder, recurrent, moderate: Secondary | ICD-10-CM

## 2021-08-29 DIAGNOSIS — N186 End stage renal disease: Secondary | ICD-10-CM

## 2021-08-29 DIAGNOSIS — I129 Hypertensive chronic kidney disease with stage 1 through stage 4 chronic kidney disease, or unspecified chronic kidney disease: Secondary | ICD-10-CM

## 2021-08-29 LAB — POCT INR: INR: 1.9 — AB (ref 2.0–3.0)

## 2021-08-29 NOTE — Patient Instructions (Signed)
INR below goal. Increase weekly dose to 7.5 mg every Tue, Thurs, Sat and 5 mg all other days. Recheck INR in 2 weeks.

## 2021-08-29 NOTE — Progress Notes (Signed)
Anticoagulation Management Jane [name] is a 76 y.o. female who reports to the clinic for monitoring of warfarin treatment.    Indication: atrial fibrillation CHA2DS2 Vasc Score 4 (Age >36, female, HTN hx), HAS-BLED 2 (Age>65, renal disease)  Duration: indefinite Supervising physician: Adrian Prows  Anticoagulation Clinic Visit History:  Patient does not report signs/symptoms of bleeding or thromboembolism.  Reports no changes in diet, medications, or lifestyle.   Patient continues to experience complains of dysphagia despite recent esophageal dilation. Evaluated by GI and was recommended to continue monitoring and symptomatic treatment. Was recommended to continue surveillance endoscopy yearly. Pt to continue taking pantoprazole 40 mg BID and Linzess for GERD and constipation.  Pt reports that she had x1 episode of hematochezia last week. Pt notes that she has hx of external hemorrhoids and notes that she hasnt had any recurrence of symptoms since. Pt also reports that she has concurrent episodes of diarrhea during that period. Pt to continue monitoring and notifying the office if symptoms continue to persist or worsen.   Pt received bilateral medical branch blocks on 08/02/21 for persistent back pain. Pt reports that her pain and discomfort have improved slightly since. Pt scheduled to follow up on 09/17/21  Pt currently on amiodarone 100 mg qday for now  Pt continues to have soft BP and hypotensive symptoms. Pt started to use scheduled midodrine 10 mg BID -TID. Continues to hold her hydralazine without significant improvement in her symptoms. Pt notes that she feels fatigued, lightheaded, dizzy, nauseous whenever her SBP <120s. Noticed improvement in her nausea, lightheaded and dizziness symptoms since taking scheduling her midodrine. Pt recently had her dry weight adjusted through her dialysis center to manage her hypotensive episodes. Will continue remote BP monitoring.  Pt requesting referral to  remote INR monitoring due to having transportation concerns. Will send in the referral on pt's behalf to [employer] Remote INR monitoring.   Anticoagulation Episode Summary     Current INR goal:  2.0-3.0  TTR:  60.2 % (2.9 y)  Next INR check:  09/12/2021  INR from last check:  1.9 (08/29/2021)  Weekly max warfarin dose:    Target end date:  Indefinite  INR check location:    Preferred lab:    Send INR reminders to:     Indications   Paroxysmal atrial fibrillation (HCC) [I48.0] Monitoring for long-term anticoagulant use [Z51.81 Z79.01]        Comments:           Allergies  Allergen Reactions   Latex Rash   Penicillins Other (See Comments)    Yeast infection / Childhood   Sulfa Antibiotics Rash   Tape Other (See Comments)    Plastic, silicone, and paper tape causes bruising and pulls off skin. Cloth tape works fine    Current Outpatient Medications:    atorvastatin (LIPITOR) 10 MG tablet, Take 1 tablet (10 mg total) by mouth daily., Disp: 30 tablet, Rfl: 3   B Complex-C-Zn-Folic Acid (DIALYVITE 557 WITH ZINC) 0.8 MG TABS, Take 1 tablet by mouth daily., Disp: , Rfl:    CALCITRIOL PO, Take by mouth 3 (three) times a week., Disp: , Rfl:    ethyl chloride spray, Apply 1 application topically every Monday, Wednesday, and Friday with hemodialysis., Disp: , Rfl: 12   linaclotide (LINZESS) 145 MCG CAPS capsule, TAKE 1 CAPSULE BY MOUTH ONCE DAILY BEFORE BREAKFAST, Disp: 90 capsule, Rfl: 2   midodrine (PROAMATINE) 10 MG tablet, Take 10 mg by mouth 3 (three) times daily  as needed (low BP and dizziness and lightheadedness symptoms.)., Disp: , Rfl:    pantoprazole (PROTONIX) 40 MG tablet, Take 1 tablet (40 mg total) by mouth 2 (two) times daily., Disp: 180 tablet, Rfl: 2   warfarin (COUMADIN) 5 MG tablet, Take 1 tablet (5 mg total) by mouth daily. Or as directed by coumadin clinic., Disp: 100 tablet, Rfl: 3 Past Medical History:  Diagnosis Date   Acid reflux    Anemia of chronic disease     Arthritis    Asthma    Atrial fibrillation ([place])    Bilateral carotid bruits    Complication of anesthesia    "hard to wake up, I have sleep apnea" no CPAP   Diverticulitis    Duodenal ulcer    Dysrhythmia    Afib   ESRD (end stage renal disease) ([place])    MWF [place]   Headache    Heart murmur    History of blood transfusion    Hypertension    Malaise and fatigue    Orthostatic hypotension    Shortness of breath    " when I walk to fast"   Sleep apnea    Syncope    Tubulovillous adenoma of colon    ASSESSMENT  Recent Results: The most recent result is correlated with 37.5 mg per week:  Lab Results  Component Value Date   INR 1.9 (A) 08/29/2021   INR 1.7 (A) 08/15/2021   INR 1.6 (A) 08/01/2021    Anticoagulation Dosing: Description   INR below goal. Increase weekly dose to 7.5 mg every Tue, Thurs, Sat and 5 mg all other days. Recheck INR in 2 weeks.       INR today: Subtherapeutic. INR improved slightly following recent weekly dose increase but still below goal. Pt notes that she had increased Vit K intake that could also be contributing to her subtherapeutic INR readings. Increased dialysis fluid removal and recent diarrhea episodes also contributing to poor INR management. Pt denies any missed or skipped weekly warfarin dose. Pt verbalized previously discussed weekly dose. Denies any other relevant changes in diet, medications, or lifestyle. Denies any other relevant complains of bleeding or bruising symptoms. Denies any TIA/stroke like symptoms. Pt confirmed that she still has a peach colored warfarin at home and hasnt noticed any changes in the color or shape of her home warfarin dose. Will continue aggressive dose titration and close monitoring to ensure INR returns back within therapeutic INR range.  PLAN Weekly dose was increased by 13.3% to 42.5 mg/week. Increase weekly dose to 7.5 mg every Tue, Thurs, Sat and 5 mg all other days. Recheck INR in 2 weeks.    Patient Instructions  INR below goal. Increase weekly dose to 7.5 mg every Tue, Thurs, Sat and 5 mg all other days. Recheck INR in 2 weeks.  Patient advised to contact clinic or seek medical attention if signs/symptoms of bleeding or thromboembolism occur.  Patient verbalized understanding by repeating back information and was advised to contact me if further medication-related questions arise.   Follow-up Return in about 2 weeks (around 09/12/2021).  [name], PharmD  15 minutes spent face-to-face with the patient during the encounter. 50% of time spent on education, including signs/sx bleeding and clotting, as well as food and drug interactions with warfarin. 50% of time was spent on fingerprick POC INR sample collection,processing, results determination, and documentation

## 2021-08-30 DIAGNOSIS — N186 End stage renal disease: Secondary | ICD-10-CM | POA: Diagnosis not present

## 2021-08-30 DIAGNOSIS — D509 Iron deficiency anemia, unspecified: Secondary | ICD-10-CM | POA: Diagnosis not present

## 2021-08-30 DIAGNOSIS — D631 Anemia in chronic kidney disease: Secondary | ICD-10-CM | POA: Diagnosis not present

## 2021-08-30 DIAGNOSIS — N2581 Secondary hyperparathyroidism of renal origin: Secondary | ICD-10-CM | POA: Diagnosis not present

## 2021-08-30 DIAGNOSIS — Z992 Dependence on renal dialysis: Secondary | ICD-10-CM | POA: Diagnosis not present

## 2021-09-02 DIAGNOSIS — D509 Iron deficiency anemia, unspecified: Secondary | ICD-10-CM | POA: Diagnosis not present

## 2021-09-02 DIAGNOSIS — D631 Anemia in chronic kidney disease: Secondary | ICD-10-CM | POA: Diagnosis not present

## 2021-09-02 DIAGNOSIS — N186 End stage renal disease: Secondary | ICD-10-CM | POA: Diagnosis not present

## 2021-09-02 DIAGNOSIS — Z992 Dependence on renal dialysis: Secondary | ICD-10-CM | POA: Diagnosis not present

## 2021-09-02 DIAGNOSIS — N2581 Secondary hyperparathyroidism of renal origin: Secondary | ICD-10-CM | POA: Diagnosis not present

## 2021-09-03 ENCOUNTER — Other Ambulatory Visit: Payer: Self-pay

## 2021-09-03 ENCOUNTER — Ambulatory Visit (HOSPITAL_COMMUNITY)
Admission: RE | Admit: 2021-09-03 | Discharge: 2021-09-03 | Disposition: A | Discharge status: Home / Self Care | Payer: Medicare Other | Source: Ambulatory Visit | Attending: Vascular Surgery | Admitting: Vascular Surgery

## 2021-09-03 ENCOUNTER — Ambulatory Visit (INDEPENDENT_AMBULATORY_CARE_PROVIDER_SITE_OTHER): Payer: Medicare Other | Admitting: Vascular Surgery

## 2021-09-03 ENCOUNTER — Encounter: Payer: Self-pay | Admitting: Vascular Surgery

## 2021-09-03 VITALS — BP 147/61 | HR 60 | Temp 98.0°F | Resp 20 | Ht 64.0 in | Wt 180.8 lb

## 2021-09-03 DIAGNOSIS — Z992 Dependence on renal dialysis: Secondary | ICD-10-CM | POA: Diagnosis not present

## 2021-09-03 DIAGNOSIS — N186 End stage renal disease: Secondary | ICD-10-CM | POA: Insufficient documentation

## 2021-09-03 MED ORDER — SODIUM CHLORIDE 0.9% FLUSH: 3.0000 mL | INTRAVENOUS | Status: DC | PRN

## 2021-09-03 MED ORDER — SODIUM CHLORIDE 0.9% FLUSH: 3.0000 mL | Freq: Two times a day (BID) | INTRAVENOUS | Status: DC

## 2021-09-03 MED ORDER — SODIUM CHLORIDE 0.9 % IV SOLN: 250.0000 mL | INTRAVENOUS | Status: DC | PRN

## 2021-09-03 NOTE — Progress Notes (Signed)
Patient name: Jane [name] MRN: [id number] DOB: October 12, 1945 Sex: female  REASON FOR CONSULT: Evaluate enlarging aneurysm of AV fistula  HPI: Jane [name] is a 76 y.o. 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  She has a right radiocephalic AV fistula that was previously revised by Dr. Donzetta Matters in 2021.  She states she has noticed this aneurysm for several years.  She states the fistula has trouble and often times the alarm sounds on a regular basis when she is in dialysis.  No open wounds or ulcers over the fistula at this time.  Past Medical History:  Diagnosis Date   Acid reflux    Anemia of chronic disease    Arthritis    Asthma    Atrial fibrillation ([place])    Bilateral carotid bruits    Complication of anesthesia    "hard to wake up, I have sleep apnea" no CPAP   Diverticulitis    Duodenal ulcer    Dysrhythmia    Afib   ESRD (end stage renal disease) ([place])    MWF [place]   Headache    Heart murmur    History of blood transfusion    Hypertension    Malaise and fatigue    Orthostatic hypotension    Shortness of breath    " when I walk to fast"   Sleep apnea    Syncope    Tubulovillous adenoma of colon     Past Surgical History:  Procedure Laterality Date   A/V FISTULAGRAM Right 10/18/2020   Procedure: A/V FISTULAGRAM;  Surgeon: Marty Heck, MD;  Location: [facility];  Service: Cardiovascular;  Laterality: Right;   AV FISTULA PLACEMENT     BACK SURGERY     Lumbar fusion L 4 and L 5   BIOPSY  01/09/2020   Procedure: BIOPSY;  Surgeon: Rush Landmark Telford Nab., MD;  Location: [place];  Service: Gastroenterology;;   BIOPSY  01/31/2021   Procedure: BIOPSY;  Surgeon: Irving Copas., MD;  Location: [place];  Service: Gastroenterology;;   COLONOSCOPY     COLONOSCOPY N/A 02/01/2021    Procedure: COLONOSCOPY;  Surgeon: Yetta Flock, MD;  Location: [place];  Service: Gastroenterology;  Laterality: N/A;   ENDOSCOPIC MUCOSAL RESECTION N/A 01/09/2020   Procedure: ENDOSCOPIC MUCOSAL RESECTION;  Surgeon: Rush Landmark Telford Nab., MD;  Location: [place];  Service: Gastroenterology;  Laterality: N/A;   ENDOSCOPIC MUCOSAL RESECTION N/A 01/31/2021   Procedure: ENDOSCOPIC MUCOSAL RESECTION;  Surgeon: Rush Landmark Telford Nab., MD;  Location: [place];  Service: Gastroenterology;  Laterality: N/A;   ESOPHAGOGASTRODUODENOSCOPY     ESOPHAGOGASTRODUODENOSCOPY (EGD) WITH PROPOFOL N/A 01/09/2020   Procedure: ESOPHAGOGASTRODUODENOSCOPY (EGD) WITH PROPOFOL;  Surgeon: Rush Landmark Telford Nab., MD;  Location: [place];  Service: Gastroenterology;  Laterality: N/A;   ESOPHAGOGASTRODUODENOSCOPY (EGD) WITH PROPOFOL N/A 01/13/2020   Procedure: ESOPHAGOGASTRODUODENOSCOPY (EGD) WITH PROPOFOL;  Surgeon: Irene Shipper, MD;  Location: [place];  Service: Gastroenterology;  Laterality: N/A;   ESOPHAGOGASTRODUODENOSCOPY (EGD) WITH PROPOFOL N/A 01/31/2021   Procedure: ESOPHAGOGASTRODUODENOSCOPY (EGD) WITH PROPOFOL;  Surgeon: Rush Landmark Telford Nab., MD;  Location: [place];  Service: Gastroenterology;  Laterality: N/A;   EUS N/A 01/09/2020   Procedure: UPPER ENDOSCOPIC ULTRASOUND (EUS) RADIAL;  Surgeon: Irving Copas., MD;  Location: [place];  Service: Gastroenterology;  Laterality: N/A;   HEMOSTASIS CLIP PLACEMENT  01/09/2020   Procedure:  HEMOSTASIS CLIP PLACEMENT;  Surgeon: Mansouraty, Telford Nab., MD;  Location: [place];  Service: Gastroenterology;;   HEMOSTASIS CLIP PLACEMENT  01/31/2021   Procedure: HEMOSTASIS CLIP PLACEMENT;  Surgeon: Irving Copas., MD;  Location: [place];  Service: Gastroenterology;;   HEMOSTASIS CONTROL  01/13/2020   Procedure: HEMOSTASIS CONTROL;  Surgeon: Irene Shipper, MD;  Location: [facility] ENDOSCOPY;  Service: Gastroenterology;;   hemaspray   HOT HEMOSTASIS N/A 01/13/2020   Procedure: HOT HEMOSTASIS (ARGON PLASMA COAGULATION/BICAP);  Surgeon: Irene Shipper, MD;  Location: [place];  Service: Gastroenterology;  Laterality: N/A;   HOT HEMOSTASIS N/A 02/01/2021   Procedure: HOT HEMOSTASIS (ARGON PLASMA COAGULATION/BICAP);  Surgeon: Yetta Flock, MD;  Location: [facility] ENDOSCOPY;  Service: Gastroenterology;  Laterality: N/A;   IR GENERIC HISTORICAL  07/09/2016   IR US GUIDE VASC ACCESS RIGHT 07/09/2016 Arne [place], MD [facility]   IR GENERIC HISTORICAL  07/09/2016   IR FLUORO GUIDE CV LINE RIGHT 07/09/2016 Arne [place], MD [facility]   KNEE ARTHROPLASTY Left    LAPAROSCOPIC SIGMOID COLECTOMY N/A 07/11/2016   Procedure: LAPAROSCOPIC SIGMOID COLECTOMY;  Surgeon: Clovis Riley, MD;  Location: [place];  Service: General;  Laterality: N/A;   MASS EXCISION Right 04/10/2020   Procedure: EXCISION SKIN NODULE RIGHT FOREARM;  Surgeon: Waynetta Sandy, MD;  Location: [place];  Service: Vascular;  Laterality: Right;   SCLEROTHERAPY  01/09/2020   Procedure: Clide Deutscher;  Surgeon: Mansouraty, Telford Nab., MD;  Location: [place];  Service: Gastroenterology;;   Clide Deutscher  01/13/2020   Procedure: Clide Deutscher;  Surgeon: Irene Shipper, MD;  Location[facility] END[facility]COPY;  Service: Gastroenterology;;   SUBMUCOSAL LIFTING INJECTION  01/09/2020   Procedure: SUBMUCOSAL LIFTING INJECTION;  Surgeon: Irving Copas., MD;  Location: [facility] ENDOSCOPY;  Service: Gastroenterology;;   TUBAL LIGATION      Family History  Problem Relation Age of Onset   Heart failure Mother    Stroke Mother    Other Father    Colon cancer Neg Hx    Liver disease Neg Hx    Esophageal cancer Neg Hx    Stomach cancer Neg Hx    Inflammatory bowel disease Neg Hx    Rectal cancer Neg Hx    Pancreatic cancer Neg Hx     SOCIAL HISTORY: Social History   Socioeconomic History   Marital status: Divorced    Spouse name: Not on file    Number of children: 4   Years of education: 14   Highest education level: Associate degree: occupational, Hotel manager, or vocational program  Occupational History   Occupation: Retired  Tobacco Use   Smoking status: Never    Passive exposure: Never   Smokeless tobacco: Never  Vaping Use   Vaping Use: Never used  Substance and Sexual Activity   Alcohol use: No   Drug use: No   Sexual activity: Never  Other Topics Concern   Not on file  Social History Narrative   HH 1   Divorced   Outpatient dialysis Mon, Wed, Fri   4 children: 1 daughter locally is an Designer, multimedia and 3 sons in [place] Determinants of Health   Financial Resource Strain: High Risk   Difficulty of Paying Living Expenses: Very hard  Food Insecurity: Landscape architect Present   Worried About Charity fundraiser in the Last Year: Often true   Arboriculturist in the Last Year: Often true  Transportation Needs: Unmet Transportation Needs   Lack  of Transportation (Medical): Yes   Lack of Transportation (Non-Medical): No  Physical Activity: Inactive   Days of Exercise per Week: 0 days   Minutes of Exercise per Session: 0 min  Stress: Stress Concern Present   Feeling of Stress : Very much  Social Connections: Not on file  Intimate Partner Violence: Not on file    Allergies  Allergen Reactions   Latex Rash   Penicillins Other (See Comments)    Yeast infection / Childhood   Sulfa Antibiotics Rash   Tape Other (See Comments)    Plastic, silicone, and paper tape causes bruising and pulls off skin. Cloth tape works fine    Current Outpatient Medications  Medication Sig Dispense Refill   atorvastatin (LIPITOR) 10 MG tablet Take 1 tablet (10 mg total) by mouth daily. 30 tablet 3   B Complex-C-Zn-Folic Acid (DIALYVITE 536 WITH ZINC) 0.8 MG TABS Take 1 tablet by mouth daily.     CALCITRIOL PO Take by mouth 3 (three) times a week.     ethyl chloride spray Apply 1 application topically every Monday, Wednesday,  and Friday with hemodialysis.  12   linaclotide (LINZESS) 145 MCG CAPS capsule TAKE 1 CAPSULE BY MOUTH ONCE DAILY BEFORE BREAKFAST 90 capsule 2   Methoxy PEG-Epoetin Beta (MIRCERA IJ) Mircera     midodrine (PROAMATINE) 10 MG tablet Take 10 mg by mouth 3 (three) times daily as needed (low BP and dizziness and lightheadedness symptoms.).     pantoprazole (PROTONIX) 40 MG tablet Take 1 tablet (40 mg total) by mouth 2 (two) times daily. 180 tablet 2   sevelamer carbonate (RENVELA) 800 MG tablet Take 800 mg by mouth 3 (three) times daily.     VITAMIN D PO Take by mouth.     warfarin (COUMADIN) 5 MG tablet Take 1 tablet (5 mg total) by mouth daily. Or as directed by coumadin clinic. (Patient taking differently: Take 5 mg by mouth daily. Or as directed by coumadin clinic.  Currently on: 7.5 mg every Tue, Thu, Sat; 5 mg all other days) 100 tablet 3   No current facility-administered medications for this visit.    REVIEW OF SYSTEMS:  [X]  denotes positive finding, [ ]  denotes negative finding Cardiac  Comments:  Chest pain or chest pressure:    Shortness of breath upon exertion:    Short of breath when lying flat:    Irregular heart rhythm:        Vascular    Pain in calf, thigh, or hip brought on by ambulation:    Pain in feet at night that wakes you up from your sleep:     Blood clot in your veins:    Leg swelling:         Pulmonary    Oxygen at home:    Productive cough:     Wheezing:         Neurologic    Sudden weakness in arms or legs:     Sudden numbness in arms or legs:     Sudden onset of difficulty speaking or slurred speech:    Temporary loss of vision in one eye:     Problems with dizziness:         Gastrointestinal    Blood in stool:     Vomited blood:         Genitourinary    Burning when urinating:     Blood in urine:        Psychiatric  Major depression:         Hematologic    Bleeding problems:    Problems with blood clotting too easily:        Skin     Rashes or ulcers:        Constitutional    Fever or chills:      PHYSICAL EXAM: Vitals:   09/03/21 1130  BP: (!) 147/61  Pulse: 60  Resp: 20  Temp: 98 F (36.7 C)  TempSrc: Temporal  SpO2: 96%  Weight: 180 lb 12.8 oz (82 kg)  Height: 5\' 4"  (1.626 m)    GENERAL: The patient is a well-nourished female, in no acute distress. The vital signs are documented above. CARDIAC: There is a regular rate and rhythm.  VASCULAR:  Right radiocephalic AVF with good thrill Aneurysmal over right forearm as pictured below, no open wounds or ulcerations PULMONARY: No respiratory distress. ABDOMEN: Soft and non-tender. MUSCULOSKELETAL: There are no major deformities or cyanosis. NEUROLOGIC: No focal weakness or paresthesias are detected. PSYCHIATRIC: The patient has a normal affect.     DATA:   Right arm fistula duplex shows aneurysmal AV fistula with flow volume of 335 mL/min  Assessment/Plan:  76 year old female with end-stage renal disease that was referred for evaluation of an aneurysmal right radiocephalic AV fistula.  As pictured above, the fistula does not have any open ulcerations or scabs and I do not see any indication for surgical revision based on bleeding risk.  The skin over the fistula appears intact.  There does appear to be more sluggish flow volume in the fistula and she states it often alarms during dialysis and at times is not working well.  I think it would be reasonable to proceed with right upper extremity fistulogram to evaluate any options to try and get this to function better.  She had a fistulogram in the past that showed occluded cephalic vein stents in the upper arm and this drained into the brachial veins.  I will schedule this for Thursday in the Cath Lab.  Risk benefits discussed.  She will hold her Coumadin for atrial fibrillation.   Marty Heck, MD [facility] Office: [phone]

## 2021-09-03 NOTE — Progress Notes (Signed)
Pt given pre op instructions. Will hold Coumadin today through Thursday. She is aware and verbalized understanding.

## 2021-09-04 ENCOUNTER — Telehealth: Payer: Self-pay | Admitting: *Deleted

## 2021-09-04 DIAGNOSIS — D631 Anemia in chronic kidney disease: Secondary | ICD-10-CM | POA: Diagnosis not present

## 2021-09-04 DIAGNOSIS — Z992 Dependence on renal dialysis: Secondary | ICD-10-CM | POA: Diagnosis not present

## 2021-09-04 DIAGNOSIS — D509 Iron deficiency anemia, unspecified: Secondary | ICD-10-CM | POA: Diagnosis not present

## 2021-09-04 DIAGNOSIS — N2581 Secondary hyperparathyroidism of renal origin: Secondary | ICD-10-CM | POA: Diagnosis not present

## 2021-09-04 DIAGNOSIS — N186 End stage renal disease: Secondary | ICD-10-CM | POA: Diagnosis not present

## 2021-09-04 NOTE — Telephone Encounter (Signed)
° °  Telephone encounter was:  Successful.  09/04/2021 Name: Jane [name] MRN: [id number] DOB: [birth year]  Jane [name] is a 76 y.o. year old female who is a primary care patient of [name], [name], MD . The community resource team was consulted for assistance with Transportation Needs  and Oxygen  Care guide performed the following interventions: Patient provided with information about care guide support team and interviewed to confirm resource needs Follow up call placed to community resources to determine status of patients referral.Talked with patient she has received information sent and had LCSW to reach out to help with funding for Oxygen  Follow Up Plan:  No further follow up planned at this time. The patient has been provided with needed resources.  [place], Care Management  [phone] [address] , [place] [address] Email : [email or web address]

## 2021-09-05 ENCOUNTER — Encounter (HOSPITAL_COMMUNITY)
Admission: RE | Disposition: A | Discharge status: Home / Self Care | Payer: Self-pay | Source: Ambulatory Visit | Attending: Vascular Surgery

## 2021-09-05 ENCOUNTER — Other Ambulatory Visit: Payer: Self-pay

## 2021-09-05 ENCOUNTER — Ambulatory Visit (HOSPITAL_COMMUNITY): Payer: Medicare Other

## 2021-09-05 ENCOUNTER — Ambulatory Visit (HOSPITAL_COMMUNITY)
Admission: RE | Admit: 2021-09-05 | Discharge: 2021-09-05 | Disposition: A | Discharge status: Home / Self Care | Payer: Medicare Other | Source: Ambulatory Visit | Attending: Vascular Surgery | Admitting: Vascular Surgery

## 2021-09-05 DIAGNOSIS — I10 Essential (primary) hypertension: Secondary | ICD-10-CM | POA: Diagnosis not present

## 2021-09-05 DIAGNOSIS — Z992 Dependence on renal dialysis: Secondary | ICD-10-CM | POA: Insufficient documentation

## 2021-09-05 DIAGNOSIS — I120 Hypertensive chronic kidney disease with stage 5 chronic kidney disease or end stage renal disease: Secondary | ICD-10-CM | POA: Diagnosis not present

## 2021-09-05 DIAGNOSIS — Z01818 Encounter for other preprocedural examination: Secondary | ICD-10-CM | POA: Diagnosis not present

## 2021-09-05 DIAGNOSIS — Y841 Kidney dialysis as the cause of abnormal reaction of the patient, or of later complication, without mention of misadventure at the time of the procedure: Secondary | ICD-10-CM | POA: Diagnosis not present

## 2021-09-05 DIAGNOSIS — N186 End stage renal disease: Secondary | ICD-10-CM | POA: Diagnosis not present

## 2021-09-05 DIAGNOSIS — T82868A Thrombosis of vascular prosthetic devices, implants and grafts, initial encounter: Secondary | ICD-10-CM | POA: Diagnosis not present

## 2021-09-05 DIAGNOSIS — T82898A Other specified complication of vascular prosthetic devices, implants and grafts, initial encounter: Secondary | ICD-10-CM | POA: Diagnosis not present

## 2021-09-05 DIAGNOSIS — T82590A Other mechanical complication of surgically created arteriovenous fistula, initial encounter: Secondary | ICD-10-CM | POA: Diagnosis not present

## 2021-09-05 HISTORY — PX: A/V FISTULAGRAM: CATH118298

## 2021-09-05 LAB — POCT I-STAT, CHEM 8
BUN: 45 mg/dL — ABNORMAL HIGH (ref 8–23)
Calcium, Ion: 1.03 mmol/L — ABNORMAL LOW (ref 1.15–1.40)
Chloride: 97 mmol/L — ABNORMAL LOW (ref 98–111)
Creatinine, Ser: 7.8 mg/dL — ABNORMAL HIGH (ref 0.44–1.00)
Glucose, Bld: 86 mg/dL (ref 70–99)
HCT: 42 % (ref 36.0–46.0)
Hemoglobin: 14.3 g/dL (ref 12.0–15.0)
Potassium: 3.9 mmol/L (ref 3.5–5.1)
Sodium: 136 mmol/L (ref 135–145)
TCO2: 29 mmol/L (ref 22–32)

## 2021-09-05 LAB — PROTIME-INR
INR: 1.7 — ABNORMAL HIGH (ref 0.8–1.2)
Prothrombin Time: 20.2 seconds — ABNORMAL HIGH (ref 11.4–15.2)

## 2021-09-05 SURGERY — A/V FISTULAGRAM
Anesthesia: LOCAL | Laterality: Right

## 2021-09-05 MED ORDER — LIDOCAINE HCL (PF) 1 % IJ SOLN
INTRAMUSCULAR | Status: DC | PRN
  Administered 2021-09-05: 4 mL

## 2021-09-05 MED ORDER — HEPARIN (PORCINE) IN NACL 1000-0.9 UT/500ML-% IV SOLN
INTRAVENOUS | Status: AC
  Filled 2021-09-05: qty 500

## 2021-09-05 MED ORDER — IODIXANOL 320 MG/ML IV SOLN
INTRAVENOUS | Status: DC | PRN
  Administered 2021-09-05: 45 mL

## 2021-09-05 MED ORDER — HEPARIN (PORCINE) IN NACL 1000-0.9 UT/500ML-% IV SOLN
INTRAVENOUS | Status: DC | PRN
  Administered 2021-09-05: 500 mL

## 2021-09-05 MED ORDER — LIDOCAINE HCL (PF) 1 % IJ SOLN
INTRAMUSCULAR | Status: AC
  Filled 2021-09-05: qty 30

## 2021-09-05 SURGICAL SUPPLY — 9 items

## 2021-09-05 NOTE — H&P (Signed)
History and Physical Interval Note:  09/05/2021 8:31 AM  [name]  has presented today for surgery, with the diagnosis of peripheral vascular disease.  The various methods of treatment have been discussed with the patient and family. After consideration of risks, benefits and other options for treatment, the patient has consented to  Procedure(s): A/V Fistulagram (Right) as a surgical intervention.  The patient's history has been reviewed, patient examined, no change in status, stable for surgery.  I have reviewed the patient's chart and labs.  Questions were answered to the patient's satisfaction.    Right arm fistulogram.  Jane [name]  Patient name: Jane [name]     MRN: [id number]        DOB: 04/21/1946        Sex: female   REASON FOR CONSULT: Evaluate enlarging aneurysm of AV fistula   HPI: Jane [name] is a 76 y.o. 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  She has a right radiocephalic AV fistula that was previously revised by Dr. Donzetta Matters in 2021.  She states she has noticed this aneurysm for several years.  She states the fistula has trouble and often times the alarm sounds on a regular basis when she is in dialysis.  No open wounds or ulcers over the fistula at this time.       Past Medical History:  Diagnosis Date   Acid reflux     Anemia of chronic disease     Arthritis     Asthma     Atrial fibrillation ([place])     Bilateral carotid bruits     Complication of anesthesia      "hard to wake up, I have sleep apnea" no CPAP   Diverticulitis     Duodenal ulcer     Dysrhythmia      Afib   ESRD (end stage renal disease) ([place])      MWF [place]   Headache     Heart murmur     History of blood transfusion     Hypertension     Malaise and fatigue     Orthostatic hypotension     Shortness of breath      " when I  walk to fast"   Sleep apnea     Syncope     Tubulovillous adenoma of colon             Past Surgical History:  Procedure Laterality Date   A/V FISTULAGRAM Right 10/18/2020    Procedure: A/V FISTULAGRAM;  Surgeon: Jane Heck, MD;  Location: [facility];  Service: Cardiovascular;  Laterality: Right;   AV FISTULA PLACEMENT       BACK SURGERY        Lumbar fusion L 4 and L 5   BIOPSY   01/09/2020    Procedure: BIOPSY;  Surgeon: Jane Landmark Telford Nab., MD;  Location: [place];  Service: Gastroenterology;;   BIOPSY   01/31/2021    Procedure: BIOPSY;  Surgeon: Jane Copas., MD;  Location: [place];  Service: Gastroenterology;;   COLONOSCOPY       COLONOSCOPY N/A 02/01/2021    Procedure: COLONOSCOPY;  Surgeon: Jane Flock, MD;  Location: [place];  Service: Gastroenterology;  Laterality: N/A;   ENDOSCOPIC MUCOSAL RESECTION N/A 01/09/2020    Procedure: ENDOSCOPIC MUCOSAL RESECTION;  Surgeon: Jane Copas.,  MD;  Location: [place];  Service: Gastroenterology;  Laterality: N/A;   ENDOSCOPIC MUCOSAL RESECTION N/A 01/31/2021    Procedure: ENDOSCOPIC MUCOSAL RESECTION;  Surgeon: Jane Landmark Telford Nab., MD;  Location: [place];  Service: Gastroenterology;  Laterality: N/A;   ESOPHAGOGASTRODUODENOSCOPY       ESOPHAGOGASTRODUODENOSCOPY (EGD) WITH PROPOFOL N/A 01/09/2020    Procedure: ESOPHAGOGASTRODUODENOSCOPY (EGD) WITH PROPOFOL;  Surgeon: Jane Landmark Telford Nab., MD;  Location: [place];  Service: Gastroenterology;  Laterality: N/A;   ESOPHAGOGASTRODUODENOSCOPY (EGD) WITH PROPOFOL N/A 01/13/2020    Procedure: ESOPHAGOGASTRODUODENOSCOPY (EGD) WITH PROPOFOL;  Surgeon: Irene Shipper, MD;  Location: [place];  Service: Gastroenterology;  Laterality: N/A;   ESOPHAGOGASTRODUODENOSCOPY (EGD) WITH PROPOFOL N/A 01/31/2021    Procedure: ESOPHAGOGASTRODUODENOSCOPY (EGD) WITH PROPOFOL;  Surgeon: Jane Landmark Telford Nab., MD;  Location: [place];  Service: Gastroenterology;  Laterality: N/A;   EUS N/A 01/09/2020    Procedure: UPPER ENDOSCOPIC ULTRASOUND (EUS) RADIAL;  Surgeon: Jane Copas., MD;  Location: [place];  Service: Gastroenterology;  Laterality: N/A;   HEMOSTASIS CLIP PLACEMENT   01/09/2020    Procedure: HEMOSTASIS CLIP PLACEMENT;  Surgeon: Jane Copas., MD;  Location: [place];  Service: Gastroenterology;;   HEMOSTASIS CLIP PLACEMENT   01/31/2021    Procedure: HEMOSTASIS CLIP PLACEMENT;  Surgeon: Jane Copas., MD;  Location: [place];  Service: Gastroenterology;;   HEMOSTASIS CONTROL   01/13/2020    Procedure: HEMOSTASIS CONTROL;  Surgeon: Irene Shipper, MD;  Location: [facility] ENDOSCOPY;  Service: Gastroenterology;;  hemaspray   HOT HEMOSTASIS N/A 01/13/2020    Procedure: HOT HEMOSTASIS (ARGON PLASMA COAGULATION/BICAP);  Surgeon: Irene Shipper, MD;  Location: [place];  Service: Gastroenterology;  Laterality: N/A;   HOT HEMOSTASIS N/A 02/01/2021    Procedure: HOT HEMOSTASIS (ARGON PLASMA COAGULATION/BICAP);  Surgeon: Jane Flock, MD;  Location: [facility] ENDOSCOPY;  Service: Gastroenterology;  Laterality: N/A;   IR GENERIC HISTORICAL   07/09/2016    IR US GUIDE VASC ACCESS RIGHT 07/09/2016 Jane Cleveland, MD [facility]   IR GENERIC HISTORICAL   07/09/2016    IR FLUORO GUIDE CV LINE RIGHT 07/09/2016 Jane Cleveland, MD [facility]   KNEE ARTHROPLASTY Left     LAPAROSCOPIC SIGMOID COLECTOMY N/A 07/11/2016    Procedure: LAPAROSCOPIC SIGMOID COLECTOMY;  Surgeon: Clovis Riley, MD;  Location: [place];  Service: General;  Laterality: N/A;   MASS EXCISION Right 04/10/2020    Procedure: EXCISION SKIN NODULE RIGHT FOREARM;  Surgeon: Jane Sandy, MD;  Location: [place];  Service: Vascular;  Laterality: Right;   SCLEROTHERAPY   01/09/2020    Procedure: Clide Deutscher;  Surgeon: Mansouraty, Telford Nab., MD;  Location: [place];  Service: Gastroenterology;;    Clide Deutscher   01/13/2020    Procedure: Clide Deutscher;  Surgeon: Irene Shipper, MD;  Location: [facility] ENDOSCOPY;  Service: Gastroenterology;;   SUBMUCOSAL LIFTING INJECTION   01/09/2020    Procedure: SUBMUCOSAL LIFTING INJECTION;  Surgeon: Jane Copas., MD;  Location: [facility] ENDOSCOPY;  Service: Gastroenterology;;   TUBAL LIGATION               Family History  Problem Relation Age of Onset   Heart failure Mother     Stroke Mother     Other Father     Colon cancer Neg Hx     Liver disease Neg Hx     Esophageal cancer Neg Hx     Stomach cancer Neg Hx     Inflammatory bowel disease Neg Hx  Rectal cancer Neg Hx     Pancreatic cancer Neg Hx        SOCIAL HISTORY: Social History         Socioeconomic History   Marital status: Divorced      Spouse name: Not on file   Number of children: 4   Years of education: 14   Highest education level: Associate degree: occupational, Hotel manager, or vocational program  Occupational History   Occupation: Retired  Tobacco Use   Smoking status: Never      Passive exposure: Never   Smokeless tobacco: Never  Vaping Use   Vaping Use: Never used  Substance and Sexual Activity   Alcohol use: No   Drug use: No   Sexual activity: Never  Other Topics Concern   Not on file  Social History Narrative    HH 1    Divorced    Outpatient dialysis Mon, Wed, Fri    4 children: 1 daughter locally is an Designer, multimedia and 3 sons in [place] Determinants of Health       Financial Resource Strain: High Risk   Difficulty of Paying Living Expenses: Very hard  Food Insecurity: Landscape architect Present   Worried About Charity fundraiser in the Last Year: Often true   Arboriculturist in the Last Year: Often true  Transportation Needs: Public librarian (Medical): Yes   Lack of Transportation (Non-Medical): No  Physical Activity: Inactive   Days of Exercise per Week: 0 days   Minutes of Exercise per Session:  0 min  Stress: Stress Concern Present   Feeling of Stress : Very much  Social Connections: Not on file  Intimate Partner Violence: Not on file           Allergies  Allergen Reactions   Latex Rash   Penicillins Other (See Comments)      Yeast infection / Childhood   Sulfa Antibiotics Rash   Tape Other (See Comments)      Plastic, silicone, and paper tape causes bruising and pulls off skin. Cloth tape works fine            Current Outpatient Medications  Medication Sig Dispense Refill   atorvastatin (LIPITOR) 10 MG tablet Take 1 tablet (10 mg total) by mouth daily. 30 tablet 3   B Complex-C-Zn-Folic Acid (DIALYVITE 093 WITH ZINC) 0.8 MG TABS Take 1 tablet by mouth daily.       CALCITRIOL PO Take by mouth 3 (three) times a week.       ethyl chloride spray Apply 1 application topically every Monday, Wednesday, and Friday with hemodialysis.   12   linaclotide (LINZESS) 145 MCG CAPS capsule TAKE 1 CAPSULE BY MOUTH ONCE DAILY BEFORE BREAKFAST 90 capsule 2   Methoxy PEG-Epoetin Beta (MIRCERA IJ) Mircera       midodrine (PROAMATINE) 10 MG tablet Take 10 mg by mouth 3 (three) times daily as needed (low BP and dizziness and lightheadedness symptoms.).       pantoprazole (PROTONIX) 40 MG tablet Take 1 tablet (40 mg total) by mouth 2 (two) times daily. 180 tablet 2   sevelamer carbonate (RENVELA) 800 MG tablet Take 800 mg by mouth 3 (three) times daily.       VITAMIN D PO Take by mouth.       warfarin (COUMADIN) 5 MG tablet Take 1 tablet (5 mg total) by mouth daily. Or as directed by  coumadin clinic. (Patient taking differently: Take 5 mg by mouth daily. Or as directed by coumadin clinic.   Currently on: 7.5 mg every Tue, Thu, Sat; 5 mg all other days) 100 tablet 3    No current facility-administered medications for this visit.      REVIEW OF SYSTEMS:  [X]  denotes positive finding, [ ]  denotes negative finding Cardiac   Comments:  Chest pain or chest pressure:      Shortness of breath  upon exertion:      Short of breath when lying flat:      Irregular heart rhythm:             Vascular      Pain in calf, thigh, or hip brought on by ambulation:      Pain in feet at night that wakes you up from your sleep:       Blood clot in your veins:      Leg swelling:              Pulmonary      Oxygen at home:      Productive cough:       Wheezing:              Neurologic      Sudden weakness in arms or legs:       Sudden numbness in arms or legs:       Sudden onset of difficulty speaking or slurred speech:      Temporary loss of vision in one eye:       Problems with dizziness:              Gastrointestinal      Blood in stool:       Vomited blood:              Genitourinary      Burning when urinating:       Blood in urine:             Psychiatric      Major depression:              Hematologic      Bleeding problems:      Problems with blood clotting too easily:             Skin      Rashes or ulcers:             Constitutional      Fever or chills:          PHYSICAL EXAM:    Vitals:    09/03/21 1130  BP: (!) 147/61  Pulse: 60  Resp: 20  Temp: 98 F (36.7 C)  TempSrc: Temporal  SpO2: 96%  Weight: 180 lb 12.8 oz (82 kg)  Height: 5\' 4"  (1.626 m)      GENERAL: The patient is a well-nourished female, in no acute distress. The vital signs are documented above. CARDIAC: There is a regular rate and rhythm.  VASCULAR:  Right radiocephalic AVF with good thrill Aneurysmal over right forearm as pictured below, no open wounds or ulcerations PULMONARY: No respiratory distress. ABDOMEN: Soft and non-tender. MUSCULOSKELETAL: There are no major deformities or cyanosis. NEUROLOGIC: No focal weakness or paresthesias are detected. PSYCHIATRIC: The patient has a normal affect.       DATA:    Right arm fistula duplex shows aneurysmal AV fistula with flow volume of 335 mL/min   Assessment/Plan:   76 year old female with end-stage  renal disease that  was referred for evaluation of an aneurysmal right radiocephalic AV fistula.  As pictured above, the fistula does not have any open ulcerations or scabs and I do not see any indication for surgical revision based on bleeding risk.  The skin over the fistula appears intact.  There does appear to be more sluggish flow volume in the fistula and she states it often alarms during dialysis and at times is not working well.  I think it would be reasonable to proceed with right upper extremity fistulogram to evaluate any options to try and get this to function better.  She had a fistulogram in the past that showed occluded cephalic vein stents in the upper arm and this drained into the brachial veins.  I will schedule this for Thursday in the Cath Lab.  Risk benefits discussed.  She will hold her Coumadin for atrial fibrillation.     Jane Heck, MD [facility] Office: [phone]

## 2021-09-05 NOTE — Op Note (Signed)
° ° °  OPERATIVE NOTE   PROCEDURE: right radiocephalic arteriovenous fistula cannulation under ultrasound guidance right arm fistulogram including central venogram  PRE-OPERATIVE DIAGNOSIS: Malfunctioning right arteriovenous fistula  POST-OPERATIVE DIAGNOSIS: same as above   SURGEON: Marty Heck, MD  ANESTHESIA: local  ESTIMATED BLOOD LOSS: 5 cc  FINDING(S):  The cephalic vein is aneurysmal and patent in the forearm.  The cephalic vein stents in the upper arm are occluded as previously demonstrated.  The forearm cephalic vein into the median cubital vein at the elbow and then drains into the brachial veins and basilic vein in the upper arm with no visualized stenosis.  No central stenosis.   If the right arm fistula has ongoing issues discussed will convert her to a basilic vein in the upper arm.  I did look at this with ultrasound today and it does appear usable.  SPECIMEN(S):  None  CONTRAST: 45 mL  INDICATIONS: Jane [name] is a 76 y.o. female who  presents with malfunctioning right radiocepahlic arteriovenous fistula.  The patient is scheduled for right arm fistuloogram.  The patient is aware the risks include but are not limited to: bleeding, infection, thrombosis of the cannulated access, and possible anaphylactic reaction to the contrast.  The patient is aware of the risks of the procedure and elects to proceed forward.  DESCRIPTION: After full informed written consent was obtained, the patient was brought back to the angiography suite and placed supine upon the angiography table.  The patient was connected to monitoring equipment.  The right arm was prepped and draped in the standard fashion for a right arm fistulogram.  Under ultrasound guidance, the right arteriovenous fistula was evaluated, it was patent, an image was saved.  It was cannulated with a micropuncture needle.  The microwire was advanced into the fistula and the needle was exchanged for the a microsheath,  which was lodged 2 cm into the access.  The wire was removed and the sheath was connected to the IV extension tubing.  Hand injections were completed to image the access from the antecubitum up to the level of axilla.  The central venous structures were also imaged by hand injections.  Based on the images, this patient will need: no intervention today.  A 4-0 Monocryl purse-string suture was sewn around the sheath.  The sheath was removed while tying down the suture.  A sterile bandage was applied to the puncture site.  COMPLICATIONS: None  CONDITION: Stable  Marty Heck, MD [facility] Office: [place]   09/05/2021 9:27 AM

## 2021-09-06 ENCOUNTER — Encounter (HOSPITAL_COMMUNITY): Payer: Self-pay | Admitting: Vascular Surgery

## 2021-09-06 ENCOUNTER — Telehealth: Payer: Self-pay

## 2021-09-06 MED ORDER — CYCLOBENZAPRINE HCL 5 MG PO TABS: 5.0000 mg | ORAL_TABLET | Freq: Three times a day (TID) | ORAL | 0 refills | Status: DC | PRN

## 2021-09-06 NOTE — Telephone Encounter (Signed)
Patient called and stated she has been having a serious issue with the right side of her back. She states she cannot walk now. She has been taking Tylenol with no relief. She wants to know if something can be called in to [facility] until she sees you on 09/17/21. Please advise

## 2021-09-06 NOTE — Addendum Note (Signed)
Addended by: Casilda Carls on: 09/06/2021 03:47 PM   Modules accepted: Orders

## 2021-09-06 NOTE — Telephone Encounter (Signed)
Patient notified. Flexeril sent to [facility]

## 2021-09-09 ENCOUNTER — Telehealth: Payer: Self-pay

## 2021-09-09 DIAGNOSIS — N186 End stage renal disease: Secondary | ICD-10-CM | POA: Diagnosis not present

## 2021-09-09 DIAGNOSIS — D631 Anemia in chronic kidney disease: Secondary | ICD-10-CM | POA: Diagnosis not present

## 2021-09-09 DIAGNOSIS — N2581 Secondary hyperparathyroidism of renal origin: Secondary | ICD-10-CM | POA: Diagnosis not present

## 2021-09-09 DIAGNOSIS — D509 Iron deficiency anemia, unspecified: Secondary | ICD-10-CM | POA: Diagnosis not present

## 2021-09-09 DIAGNOSIS — Z992 Dependence on renal dialysis: Secondary | ICD-10-CM | POA: Diagnosis not present

## 2021-09-09 NOTE — Patient Instructions (Signed)
Visit Information  Thank you for taking time to visit with me today. Please don't hesitate to contact me if I can be of assistance to you before our next scheduled telephone appointment.  Following are the goals we discussed today:  Patient Goals/Self-Care Activities: Over the next 120 days Attend all scheduled medical appointments Utilize healthy coping skills and/or supportive resources discussed Contact PCP office with any questions or concerns  Our next appointment is by telephone on 09/20/21 at 10:00 AM  Please call the care guide team at [phone] if you need to cancel or reschedule your appointment.   If you are experiencing a Mental Health or Manor or need someone to talk to, please call the [place] National Suicide Prevention Lifeline: (609)017-0477 or TTY: 769-816-0918 TTY 414-318-4169) to talk to a trained counselor call 911   Following is a copy of your full plan of care:  Care Plan : LCSW Plan of Care  Updates made by Rebekah Chesterfield, LCSW since 09/09/2021 12:00 AM     Problem: Coping Skills (General Plan of Care)      Goal: Coping Skills Enhanced   Start Date: 08/29/2021  This Visit's Progress: On track  Priority: High  Note:    Current barriers:    Financial constraints related to affording oxygen Clinical Goals: Patient will work with CCM LCSW to address needs related to management of health conditions Clinical Interventions:  Assessment of needs, barriers , agencies contacted, as well as how impacting care Patient completed a sleep study and was informed oxygen levels are low. States she needs oxygen (2 liters) when she sleeps, which will be approx $16 a month; however, pt is experiencing hardship financially so she declined services AARP will only will assist if she was a member but membership wasn't beneficial. It will be $16 yearly for membership and Jeanine found resources. CCM LCSW will collaborate with care guide Pt receives support from  LCSW at dialysis regarding transportation resources. Pt's SNAP benefits were terminated after a small increase in disability. Pt is experiencing difficulty with food restrictions due to chronic health conditions. LCSW discussed supportive resources Patient was successful in identifying healthy strategies to cope with stress (praying, walking) She receives support from daughter and son and law resides local Mindfulness or Relaxation training provided Active listening / Reflection utilized  Emotional Support Provided Problem [place] strategies reviewed Verbalization of feelings encouraged  Review various resources, discussed options and provided patient information about  Community food options  1:1 collaboration with primary care provider regarding development and update of comprehensive plan of care as evidenced by provider attestation and co-signature Inter-disciplinary care team collaboration (see longitudinal plan of care) Patient Goals/Self-Care Activities: Over the next 120 days Attend all scheduled medical appointments Utilize healthy coping skills and/or supportive resources discussed Contact PCP office with any questions or concerns        Ms. Crew was given information about Care Management services by the embedded care coordination team including:  Care Management services include personalized support from designated clinical staff supervised by her physician, including individualized plan of care and coordination with other care providers 24/7 contact phone numbers for assistance for urgent and routine care needs. The patient may stop CCM services at any time (effective at the end of the month) by phone call to the office staff.  Patient agreed to services and verbal consent obtained.   Patient verbalizes understanding of instructions and care plan provided today and agrees to view in [place].  Active MyChart status confirmed with patient.    Christa See, MSW,  [email or web address] Phone [phone] 6:33 AM

## 2021-09-09 NOTE — Telephone Encounter (Signed)
PA submitted for Cyclobenzaprine

## 2021-09-09 NOTE — Chronic Care Management (AMB) (Signed)
Chronic Care Management    Clinical Social Work Note  09/09/2021 Name: Jane [name] MRN: [id number] DOB: 10/27/1945  Jane [name] is a 76 y.o. year old female who is a primary care patient of [name], [name], MD. The CCM team was consulted to assist the patient with chronic disease management and/or care coordination needs related to: [employer]  and [employer].   Engaged with patient by telephone for initial visit in response to provider referral for social work chronic care management and care coordination services.   Consent to Services:  The patient was given the following information about Chronic Care Management services today, agreed to services, and gave verbal consent: 1. CCM service includes personalized support from designated clinical staff supervised by the primary care provider, including individualized plan of care and coordination with other care providers 2. 24/7 contact phone numbers for assistance for urgent and routine care needs. 3. Service will only be billed when office clinical staff spend 20 minutes or more in a month to coordinate care. 4. Only one practitioner may furnish and bill the service in a calendar month. 5.The patient may stop CCM services at any time (effective at the end of the month) by phone call to the office staff. 6. The patient will be responsible for cost sharing (co-pay) of up to 20% of the service fee (after annual deductible is met). Patient agreed to services and consent obtained.  Patient agreed to services and consent obtained.   Assessment: Review of patient past medical history, allergies, medications, and health status, including review of relevant consultants reports was performed today as part of a comprehensive evaluation and provision of chronic care management and care coordination services.     SDOH (Social Determinants of Health) assessments and interventions performed:    Advanced Directives Status:  Not addressed in this encounter.  CCM Care Plan  Allergies  Allergen Reactions   Latex Rash   Penicillins Other (See Comments)    Yeast infection / Childhood   Sulfa Antibiotics Rash   Tape Other (See Comments)    Plastic, silicone, and paper tape causes bruising and pulls off skin. Cloth tape works fine    Outpatient Encounter Medications as of 08/29/2021  Medication Sig   atorvastatin (LIPITOR) 10 MG tablet Take 1 tablet (10 mg total) by mouth daily.   B Complex-C-Zn-Folic Acid (DIALYVITE 256 WITH ZINC) 0.8 MG TABS Take 1 tablet by mouth in the morning.   CALCITRIOL PO Take by mouth every Monday, Wednesday, and Friday with hemodialysis.   ethyl chloride spray Apply 1 application topically every Monday, Wednesday, and Friday with hemodialysis.   linaclotide (LINZESS) 145 MCG CAPS capsule TAKE 1 CAPSULE BY MOUTH ONCE DAILY BEFORE BREAKFAST   midodrine (PROAMATINE) 10 MG tablet Take 10 mg by mouth 3 (three) times daily as needed (low BP and dizziness and lightheadedness symptoms.).   pantoprazole (PROTONIX) 40 MG tablet Take 1 tablet (40 mg total) by mouth 2 (two) times daily.   warfarin (COUMADIN) 5 MG tablet Take 1 tablet (5 mg total) by mouth daily. Or as directed by coumadin clinic. (Patient taking differently: Take 5-7.5 mg by mouth See admin instructions. Take 1 tablet (5 mg) by mouth on Sundays, Mondays, Wednesdays, Fridays & Saturdays in the morning. Take 1.5 tablets (7.5 mg) by mouth on Tuesdays & Thursdays in the morning)   No facility-administered encounter medications on file as of 08/29/2021.    Patient Active Problem List   Diagnosis  Date Noted   OSA (obstructive sleep apnea) 07/19/2021   Atherosclerosis of aorta ([place]) 05/13/2021   Unintentional weight loss 05/08/2021   Dysphagia 05/08/2021   Chronic left sacroiliac pain 05/07/2021   GI bleed 01/31/2021   Gastric intestinal metaplasia 10/18/2020   Dyspepsia 10/18/2020   Chronic constipation 10/18/2020   Nausea without  vomiting 05/29/2020   Generalized osteoarthritis of multiple sites 03/27/2020   Chronic pain disorder 03/27/2020   Acute GI bleeding 01/13/2020   Duodenal ulcer with hemorrhage    Lower GI bleed 01/12/2020   Monitoring for long-term anticoagulant use 01/03/2020   Intestinal metaplasia of gastric mucosa 12/04/2019   Gastric polyp 12/04/2019   Tubulovillous adenoma of small intestine 12/03/2019   Adenomatous duodenal polyp 12/03/2019   Abnormal endoscopy of upper gastrointestinal tract 12/03/2019   Depression, major, recurrent, moderate ([place]) 09/22/2018   Orthostatic hypotension 09/16/2018   Bilateral lower extremity edema 08/24/2018   Abdominal pain, diffuse 05/11/2018   Body mass index 40.0-44.9, adult ([place]) 03/18/2018   Unilateral primary osteoarthritis, right knee 03/18/2018   Chronic pain of right knee 03/18/2018   Abnormal facial hair 11/10/2017   Chronic anticoagulation 11/10/2017   GERD (gastroesophageal reflux disease) 06/16/2017   Constipation 10/16/2016   Morbid obesity ([place]) 10/16/2016   Diverticulitis of large intestine with perforation 06/26/2016   Anemia in other chronic diseases classified elsewhere 06/25/2016   Chest pain with moderate risk for cardiac etiology 06/25/2016   ESRD on hemodialysis ([place]) 06/30/2012   Paroxysmal atrial fibrillation ([place]) 06/30/2012   Hypertension with renal disease 06/30/2012    Conditions to be addressed/monitored: HTN, ESRD, and Depression; Financial constraints related to affording oxygen  Care Plan : LCSW Plan of Care  Updates made by Rebekah [place], LCSW since 09/09/2021 12:00 AM     Problem: Coping Skills (General Plan of Care)      Goal: Coping Skills Enhanced   Start Date: 08/29/2021  This Visit's Progress: On track  Priority: High  Note:    Current barriers:    Financial constraints related to affording oxygen Clinical Goals: Patient will work with CCM LCSW to address needs related to management of health  conditions Clinical Interventions:  Assessment of needs, barriers , agencies contacted, as well as how impacting care Patient completed a sleep study and was informed oxygen levels are low. States she needs oxygen (2 liters) when she sleeps, which will be approx $16 a month; however, pt is experiencing hardship financially [name] she declined services AARP will only will assist if she was a member but membership wasn't beneficial. It will be $16 yearly for membership and Jeanine found resources. CCM LCSW will collaborate with care guide Pt receives support from LCSW at dialysis regarding transportation resources. Pt's SNAP benefits were terminated after a small increase in disability. Pt is experiencing difficulty with food restrictions due to chronic health conditions. LCSW discussed supportive resources Patient was successful in identifying healthy strategies to cope with stress (praying, walking) She receives support from daughter and son and law resides local Mindfulness or Relaxation training provided Active listening / Reflection utilized  Emotional Support Provided Problem [place] strategies reviewed Verbalization of feelings encouraged  Review various resources, discussed options and provided patient information about  Community food options  1:1 collaboration with primary care provider regarding development and update of comprehensive plan of care as evidenced by provider attestation and co-signature Inter-disciplinary care team collaboration (see longitudinal plan of care) Patient Goals/Self-Care Activities: Over the next 120 days Attend all  scheduled medical appointments Utilize healthy coping skills and/or supportive resources discussed Contact PCP office with any questions or concerns         Christa See, MSW, [email or web address] Phone [phone] 6:31 AM

## 2021-09-10 NOTE — Telephone Encounter (Signed)
Cyclobenzaprine approved for 08/10/2021 until further notice.

## 2021-09-11 DIAGNOSIS — D631 Anemia in chronic kidney disease: Secondary | ICD-10-CM | POA: Diagnosis not present

## 2021-09-11 DIAGNOSIS — N2581 Secondary hyperparathyroidism of renal origin: Secondary | ICD-10-CM | POA: Diagnosis not present

## 2021-09-11 DIAGNOSIS — N186 End stage renal disease: Secondary | ICD-10-CM | POA: Diagnosis not present

## 2021-09-11 DIAGNOSIS — D509 Iron deficiency anemia, unspecified: Secondary | ICD-10-CM | POA: Diagnosis not present

## 2021-09-11 DIAGNOSIS — Z992 Dependence on renal dialysis: Secondary | ICD-10-CM | POA: Diagnosis not present

## 2021-09-13 DIAGNOSIS — D509 Iron deficiency anemia, unspecified: Secondary | ICD-10-CM | POA: Diagnosis not present

## 2021-09-13 DIAGNOSIS — N186 End stage renal disease: Secondary | ICD-10-CM | POA: Diagnosis not present

## 2021-09-13 DIAGNOSIS — D631 Anemia in chronic kidney disease: Secondary | ICD-10-CM | POA: Diagnosis not present

## 2021-09-13 DIAGNOSIS — Z992 Dependence on renal dialysis: Secondary | ICD-10-CM | POA: Diagnosis not present

## 2021-09-13 DIAGNOSIS — N2581 Secondary hyperparathyroidism of renal origin: Secondary | ICD-10-CM | POA: Diagnosis not present

## 2021-09-17 ENCOUNTER — Encounter: Payer: Medicare Other | Attending: Physical Medicine & Rehabilitation | Admitting: Physical Medicine & Rehabilitation

## 2021-09-17 ENCOUNTER — Telehealth: Payer: Self-pay | Admitting: Internal Medicine

## 2021-09-17 ENCOUNTER — Other Ambulatory Visit: Payer: Self-pay | Admitting: Pharmacist

## 2021-09-17 ENCOUNTER — Ambulatory Visit: Payer: Medicare Other | Admitting: Pharmacist

## 2021-09-17 ENCOUNTER — Other Ambulatory Visit: Payer: Self-pay

## 2021-09-17 ENCOUNTER — Encounter: Payer: Self-pay | Admitting: Physical Medicine & Rehabilitation

## 2021-09-17 VITALS — BP 164/75 | HR 84 | Ht 64.0 in | Wt 184.0 lb

## 2021-09-17 DIAGNOSIS — Z5181 Encounter for therapeutic drug level monitoring: Secondary | ICD-10-CM | POA: Diagnosis not present

## 2021-09-17 DIAGNOSIS — I480 Paroxysmal atrial fibrillation: Secondary | ICD-10-CM

## 2021-09-17 DIAGNOSIS — Z7901 Long term (current) use of anticoagulants: Secondary | ICD-10-CM | POA: Diagnosis not present

## 2021-09-17 DIAGNOSIS — I951 Orthostatic hypotension: Secondary | ICD-10-CM

## 2021-09-17 DIAGNOSIS — M47816 Spondylosis without myelopathy or radiculopathy, lumbar region: Secondary | ICD-10-CM | POA: Diagnosis not present

## 2021-09-17 LAB — POCT INR: INR: 4.2 — AB (ref 2.0–3.0)

## 2021-09-17 MED ORDER — MIDODRINE HCL 10 MG PO TABS: 10.0000 mg | ORAL_TABLET | Freq: Three times a day (TID) | ORAL | 3 refills | Status: DC | PRN

## 2021-09-17 NOTE — Patient Instructions (Signed)
INR above goal. HOLD today. Then decrease weekly dose to 7.5 mg every Tues, 5 mg all other days. Recheck INR in 1 week.

## 2021-09-17 NOTE — Progress Notes (Signed)
°  PROCEDURE RECORD [facility]   Name: Jane [name] DOB:11-25-1945 MRN: [id number]  Date:09/17/2021  Physician: [name], MD    Nurse/CMA: Truman Hayward, CMA  Allergies:  Allergies  Allergen Reactions   Latex Rash   Penicillins Other (See Comments)    Yeast infection / Childhood   Sulfa Antibiotics Rash   Tape Other (See Comments)    Plastic, silicone, and paper tape causes bruising and pulls off skin. Cloth tape works fine    Consent Signed: Yes.    Is patient diabetic? No.  CBG today? .  Pregnant: No. LMP: No LMP recorded. Patient is postmenopausal. (age 39-55)  Anticoagulants: yes (warfarin) Anti-inflammatory: no Antibiotics: no  Procedure: Bilateral T12,L1,L2 Medial Branch Block  Position: Prone Start Time: 3:12 pm  End Time: 3:22 pm  Fluoro Time: 49  RN/CMA Chellie Vanlue,CMA Heide Brossart,CMA    Time 2:53 pm 3:32 pm    BP 164/75 186/79    Pulse 99 104    Respirations 16 16    O2 Sat 98 90    S/S 6 6    Pain Level 9/10 7/10     D/C home with daughter, patient A & O X 3, D/C instructions reviewed, and sits independently.

## 2021-09-17 NOTE — Patient Instructions (Addendum)
Lumbar medial branch blocks were performed. This is to help diagnose the cause of the low back pain. It is important that you keep track of your pain for the first day or 2 after injection. This injection can give you temporary relief that lasts for hours or up to several months. There is no way to predict duration of pain relief.  Please try to compare your pain after injection to for the injection.  If this injection gives you  temporary relief there may be another longer-lasting procedure that may be beneficial call radiofrequency ablation   Please call tomorrow to report pain level in low back area

## 2021-09-17 NOTE — Progress Notes (Signed)
Bilateral T 12, L1, L2 medial branch blocks under fluoroscopic guidance  Indication: Lumbar pain which is not relieved by medication management or other conservative care and interfering with self-care and mobility.  Informed consent was obtained after describing risks and benefits of the procedure with the patient, this includes bleeding, bruising, infection, paralysis and medication side effects. The patient wishes to proceed and has given written cons3.5ent. The patient was placed in a prone position. The lumbar area was marked and prepped with Betadine. One ML of 1% lidocaine was injected into each of 6 areas into the skin and subcutaneous tissue. Then a 22-gauge 3.5in spinal needle was inserted targeting the junction of the left L3 superior articular process /transverse process junction. Needle was advanced under fluoroscopic guidance. Bone contact was made. Isovue 200 was injected x0.5 mL demonstrating no intravascular uptake. Then a solution containing 2% MPF lidocaine was injected x0.5 mL. Then the left L2 superior articular process in transverse process junction was targeted. Bone contact was made. Isovue 200 was injected x0.5 mL demonstrating no intravascular uptake. Then a solution containing 2% MPF lidocaine was injected x0.5 mL. Then the left L1 superior articular process in transverse process junction was targeted. Bone contact was made. Isovue 200was injected x0.5 mL demonstrating no intravascular uptake. Then a solution 2% MPF lidocaine was injected x0.5 mL. this same procedure was performed on the right side using the same needle, technique, and injectate. Patient tolerated procedure well. Post procedure instructions were given. Please refer to post procedure form.   On 08/02/21 Pre injection back pain 6/10 Post injection back pain 0/10  On 09/17/21 Pre injection back pain 9/10 Post injection back pain 7/10-  pt to call in am to update if further improvements are noted

## 2021-09-17 NOTE — Telephone Encounter (Signed)
Pt states she started passing some dark blood in her stool about 2 weeks ago. Now she states it is a lighter bright red color. Reports she only sees the blood when she has a BM. Pt does have a history of hemorrhoids but is also on a blood thinner and has had GI bleeding in the past. Pt scheduled to see Carl Best NP 09/19/21 at 1:30pm. Pt aware of appt.

## 2021-09-17 NOTE — Progress Notes (Signed)
Anticoagulation Management Jane [name] is a 76 y.o. female who reports to the clinic for monitoring of warfarin treatment.    Indication: atrial fibrillation CHA2DS2 Vasc Score 4 (Age >75, female, HTN hx), HAS-BLED 2 (Age>65, renal disease)  Duration: indefinite Supervising physician: Adrian Prows  Anticoagulation Clinic Visit History:  Patient does not report signs/symptoms of bleeding or thromboembolism.  Reports no changes in diet, medications, or lifestyle.   Patient continues to experience complains of dysphagia despite recent esophageal dilation. Evaluated by GI and was recommended to continue monitoring and symptomatic treatment. Was recommended to continue surveillance endoscopy yearly. Pt to continue taking pantoprazole 40 mg BID and Linzess for GERD and constipation. Pt notes that she has hx of external hemorrhoid. Pt reports that she has been noticing abdominal pain, dark stool, and complains of fatigue and tiredness over the past 2 weeks. Reports noticing bright red bowel movement last night. Denies any recent iron or pepto-bismol intake. Denies any NSAID use. Pt planing on following up with GI provider later today.   Pt received bilateral medical branch blocks on 08/02/21 for persistent back pain. Pt reports that pain recently got significantly worse over the past week. Started on Flexeril. Has a follow up with pain specialist later today for repeat procedure.   Pt currently on amiodarone 100 mg qday for now  Pt continues to have soft BP and hypotensive symptoms. Pt started to use scheduled midodrine 10 mg BID -TID. Continues to hold her hydralazine without significant improvement in her symptoms. Pt notes that she feels fatigued, lightheaded, dizzy, nauseous whenever her SBP <120s. Noticed improvement in her nausea, lightheaded and dizziness symptoms since taking scheduling her midodrine. Pt recently had her dry weight adjusted through her dialysis center to manage her hypotensive  episodes. Will continue remote BP monitoring. Midodrine refilled.  Pt requesting referral to remote INR monitoring due to having transportation concerns. Will send in the referral on pt's behalf to [employer] Remote INR monitoring.   Pt s/p fistulogram procedure on 09/05/21. Pt reports that she was instructed to hold her warfarin 3 days prior to the procedure. Tolerated the surgery without significant bleeding or bruising complications. Pt restarted on her previous home dose the same day. INR checked prior to the procedure showed INR of 1.7.   Pt also reports that she recently also decreased her Vit K intake. Pt reports that she was previously eating regular intake of cabbage and cucumber, but recently stopped eating high Vit K and only eating plain iceberg lettuce. Pt reports that she made the switch only last month. Pt planning on continuing with low Vit K intake   Anticoagulation Episode Summary     Current INR goal:  2.0-3.0  TTR:  59.5 % (3 y)  Next INR check:  09/26/2021  INR from last check:  4.2 (09/17/2021)  Weekly max warfarin dose:    Target end date:  Indefinite  INR check location:    Preferred lab:    Send INR reminders to:     Indications   Paroxysmal atrial fibrillation (HCC) [I48.0] Monitoring for long-term anticoagulant use [Z51.81 Z79.01]        Comments:           Allergies  Allergen Reactions   Latex Rash   Penicillins Other (See Comments)    Yeast infection / Childhood   Sulfa Antibiotics Rash   Tape Other (See Comments)    Plastic, silicone, and paper tape causes bruising and pulls off skin. Cloth tape works fine  Current Outpatient Medications:    atorvastatin (LIPITOR) 10 MG tablet, Take 1 tablet (10 mg total) by mouth daily., Disp: 30 tablet, Rfl: 3   B Complex-C-Zn-Folic Acid (DIALYVITE 741 WITH ZINC) 0.8 MG TABS, Take 1 tablet by mouth in the morning., Disp: , Rfl:    CALCITRIOL PO, Take by mouth every Monday, Wednesday, and Friday with  hemodialysis., Disp: , Rfl:    cyclobenzaprine (FLEXERIL) 5 MG tablet, Take 1 tablet (5 mg total) by mouth 3 (three) times daily as needed., Disp: 45 tablet, Rfl: 0   ethyl chloride spray, Apply 1 application topically every Monday, Wednesday, and Friday with hemodialysis., Disp: , Rfl: 12   linaclotide (LINZESS) 145 MCG CAPS capsule, TAKE 1 CAPSULE BY MOUTH ONCE DAILY BEFORE BREAKFAST, Disp: 90 capsule, Rfl: 2   Methoxy PEG-Epoetin Beta (MIRCERA IJ), Mircera, Disp: , Rfl:    midodrine (PROAMATINE) 10 MG tablet, Take 10 mg by mouth 3 (three) times daily as needed (low BP and dizziness and lightheadedness symptoms.)., Disp: , Rfl:    pantoprazole (PROTONIX) 40 MG tablet, Take 1 tablet (40 mg total) by mouth 2 (two) times daily., Disp: 180 tablet, Rfl: 2   sevelamer carbonate (RENVELA) 800 MG tablet, Take 800 mg by mouth 3 (three) times daily., Disp: , Rfl:    warfarin (COUMADIN) 5 MG tablet, Take 1 tablet (5 mg total) by mouth daily. Or as directed by coumadin clinic. (Patient taking differently: Take 5-7.5 mg by mouth See admin instructions. Take 1 tablet (5 mg) by mouth on Sundays, Mondays, Wednesdays, Fridays & Saturdays in the morning. Take 1.5 tablets (7.5 mg) by mouth on Tuesdays & Thursdays in the morning), Disp: 100 tablet, Rfl: 3  Current Facility-Administered Medications:    0.9 %  sodium chloride infusion, 250 mL, Intravenous, PRN, Marty Heck, MD   sodium chloride flush (NS) 0.9 % injection 3 mL, 3 mL, Intravenous, Q12H, Clark, Gwenyth Allegra, MD   sodium chloride flush (NS) 0.9 % injection 3 mL, 3 mL, Intravenous, PRN, Marty Heck, MD Past Medical History:  Diagnosis Date   Acid reflux    Anemia of chronic disease    Arthritis    Asthma    Atrial fibrillation (HCC)    Bilateral carotid bruits    Complication of anesthesia    "hard to wake up, I have sleep apnea" no CPAP   Diverticulitis    Duodenal ulcer    Dysrhythmia    Afib   ESRD (end stage renal disease)  ([place])    MWF [place]   Headache    Heart murmur    History of blood transfusion    Hypertension    Malaise and fatigue    Orthostatic hypotension    Shortness of breath    " when I walk to fast"   Sleep apnea    Syncope    Tubulovillous adenoma of colon    ASSESSMENT  Recent Results: The most recent result is correlated with 42.5 mg per week:  Lab Results  Component Value Date   INR 4.2 (A) 09/17/2021   INR 1.7 (H) 09/05/2021   INR 1.9 (A) 08/29/2021    Anticoagulation Dosing: Description   INR above goal. HOLD today. Then decrease weekly dose to 7.5 mg every Tues, 5 mg all other days. Recheck INR in 1 week.       INR today: Supratherapeutic. INR significantly elevated following recent weekly dose increase and pt's reduced Vit K intake. Pt's recent concerns of abdominal  pain and hematochezia/melena episodes over the past 2 weeks also concerning of potential GI bleeds. Waiting for follow up from GI provider to evaluate further management concerns.  Denies any other relevant changes in diet, medications, or lifestyle. Denies any other relevant complains of bleeding or bruising symptoms. Denies any TIA/stroke like symptoms. Pt confirmed that she still has a peach colored warfarin at home and hasnt noticed any changes in the color or shape of her home warfarin dose. Will have pt hold today in setting of potential GI bleeding concerns and supratheraputic INR reading today.   PLAN Weekly dose was decreased by 11.8% to 37.5 mg/week. HOLD today, and then decrease weekly dose to 7.5 mg every Tues, and 5 mg all other days. Recheck INR in 1 week.   Patient Instructions  INR above goal. HOLD today. Then decrease weekly dose to 7.5 mg every Tues, 5 mg all other days. Recheck INR in 1 week.  Patient advised to contact clinic or seek medical attention if signs/symptoms of bleeding or thromboembolism occur.  Patient verbalized understanding by repeating back information and was  advised to contact me if further medication-related questions arise.   Follow-up Return in about 9 days (around 09/26/2021).  [name], PharmD  15 minutes spent face-to-face with the patient during the encounter. 50% of time spent on education, including signs/sx bleeding and clotting, as well as food and drug interactions with warfarin. 50% of time was spent on fingerprick POC INR sample collection,processing, results determination, and documentation

## 2021-09-17 NOTE — Telephone Encounter (Signed)
Inbound call from patient states she have began bleeding from her rectum for 2 weeks now. States it started out dark but now it's more red. States her stomach hurts all the time and very concerned. Patient would like a call back to see what she should do?

## 2021-09-18 ENCOUNTER — Telehealth: Payer: Self-pay | Admitting: Pharmacist

## 2021-09-18 DIAGNOSIS — N2581 Secondary hyperparathyroidism of renal origin: Secondary | ICD-10-CM | POA: Diagnosis not present

## 2021-09-18 DIAGNOSIS — I158 Other secondary hypertension: Secondary | ICD-10-CM | POA: Diagnosis not present

## 2021-09-18 DIAGNOSIS — Z992 Dependence on renal dialysis: Secondary | ICD-10-CM | POA: Diagnosis not present

## 2021-09-18 DIAGNOSIS — N186 End stage renal disease: Secondary | ICD-10-CM | POA: Diagnosis not present

## 2021-09-18 DIAGNOSIS — D631 Anemia in chronic kidney disease: Secondary | ICD-10-CM | POA: Diagnosis not present

## 2021-09-18 NOTE — Chronic Care Management (AMB) (Signed)
? ? ?Chronic Care Management ?Pharmacy Assistant  ? ?Name: Jane [name]  MRN: [id number] DOB: Dec 28, 1945 ? ?Reason for Encounter: Disease State / Hypotension Assessment Call ?  ?Conditions to be addressed/monitored: ?HTN ? ? ?Recent office visits:  ?None ? ?Recent consult visits:  ?09/17/2021 [name] MD (Phys med and rehab) - Patient was seen for Spondylosis without myelopathy or radiculopathy, lumbar region. No medication changes. Follow up if symptoms worsen or fail to improve.  ? ?09/03/2021 Monica Martinez MD (vascular) - Patient was seen for ESRD on hemodialysis. No medication changes. No follow up noted.  ? ?Hospital visits:  ?None ? ?Medications: ?Outpatient Encounter Medications as of 09/18/2021  ?Medication Sig  ? atorvastatin (LIPITOR) 10 MG tablet Take 1 tablet (10 mg total) by mouth daily.  ? B Complex-C-Zn-Folic Acid (DIALYVITE 735 WITH ZINC) 0.8 MG TABS Take 1 tablet by mouth in the morning.  ? CALCITRIOL PO Take by mouth every Monday, Wednesday, and Friday with hemodialysis.  ? cyclobenzaprine (FLEXERIL) 5 MG tablet Take 1 tablet (5 mg total) by mouth 3 (three) times daily as needed.  ? ethyl chloride spray Apply 1 application topically every Monday, Wednesday, and Friday with hemodialysis.  ? linaclotide (LINZESS) 145 MCG CAPS capsule TAKE 1 CAPSULE BY MOUTH ONCE DAILY BEFORE BREAKFAST  ? Methoxy PEG-Epoetin Beta (MIRCERA IJ) Mircera  ? midodrine (PROAMATINE) 10 MG tablet Take 1 tablet (10 mg total) by mouth 3 (three) times daily as needed (low BP and dizziness and lightheadedness symptoms.).  ? pantoprazole (PROTONIX) 40 MG tablet Take 1 tablet (40 mg total) by mouth 2 (two) times daily.  ? sevelamer carbonate (RENVELA) 800 MG tablet Take 800 mg by mouth 3 (three) times daily.  ? warfarin (COUMADIN) 5 MG tablet Take 1 tablet (5 mg total) by mouth daily. Or as directed by coumadin clinic. (Patient taking differently: Take 5-7.5 mg by mouth See admin instructions. Take 1 tablet (5 mg) by mouth  on Sundays, Mondays, Wednesdays, Fridays & Saturdays in the morning. ?Take 1.5 tablets (7.5 mg) by mouth on Tuesdays & Thursdays in the morning)  ? ?Facility-Administered Encounter Medications as of 09/18/2021  ?Medication  ? 0.9 %  sodium chloride infusion  ? sodium chloride flush (NS) 0.9 % injection 3 mL  ? sodium chloride flush (NS) 0.9 % injection 3 mL  ?Fill History: ?atorvastatin 10 mg tablet 09/12/2021 63  ? ?CARVEDILOL 25MG  TAB 06/21/2021 90  ? ?cyclobenzaprine 5 mg tablet 09/09/2021 15  ? ?midodrine 10 mg tablet 08/22/2021 90  ? ?PANTOPRAZOLE 40MG  DR TAB 08/22/2021 90  ? ?SEVELAMER CARB 800 MG TAB 08/26/2021 30  ? ?WARFARIN 5MG  TAB 08/11/2021 90  ? ?Reviewed chart prior to disease state call. Spoke with patient regarding BP ? ?Recent Office Vitals: ?BP Readings from Last 3 Encounters:  ?09/17/21 (!) 164/75  ?09/05/21 (!) 111/55  ?09/03/21 (!) 147/61  ? ?Pulse Readings from Last 3 Encounters:  ?09/17/21 84  ?09/05/21 67  ?09/03/21 60  ?  ?Wt Readings from Last 3 Encounters:  ?09/17/21 184 lb (83.5 kg)  ?09/05/21 179 lb (81.2 kg)  ?09/03/21 180 lb 12.8 oz (82 kg)  ?  ? ?Kidney Function ?Lab Results  ?Component Value Date/Time  ? CREATININE 7.80 (H) 09/05/2021 07:26 AM  ? CREATININE 9.77 (H) 04/29/2021 04:12 PM  ? GFR 4.82 (LL) 11/20/2020 10:25 AM  ? GFRNONAA 4 (L) 04/29/2021 04:12 PM  ? GFRAA 4 (L) 01/18/2020 07:14 AM  ? ? ?BMP Latest Ref Rng & Units 09/05/2021 04/29/2021  02/01/2021  ?Glucose 70 - 99 mg/dL 86 110(H) 85  ?BUN 8 - 23 mg/dL 45(H) 36(H) 29(H)  ?Creatinine 0.44 - 1.00 mg/dL 7.80(H) 9.77(H) 6.79(H)  ?Sodium 135 - 145 mmol/L 136 137 136  ?Potassium 3.5 - 5.1 mmol/L 3.9 3.3(L) 4.4  ?Chloride 98 - 111 mmol/L 97(L) 101 99  ?CO2 22 - 32 mmol/L - 26 30  ?Calcium 8.9 - 10.3 mg/dL - 9.8 8.9  ? ? ?Current antihypotensive regimen:  ?Midodrine 10 mg Take 1 tablet 3 times daily as needed for low BP and dizziness and lightheadedness symptoms ? ?How often are you checking your Blood Pressure? She is checking 4-6  times per day.  ? ?Current home BP readings: Patient states her blood pressures have been  on rare occasion 60/30 (Midodrine will bring it back up) otherwise her readings are around 113/55 ? ?What recent interventions/DTPs have been made by any provider to improve Blood Pressure control since last CPP Visit: No recent interventions ? ?Any recent hospitalizations or ED visits since last visit with CPP? No recent hospital visits.  ? ?What diet changes have been made to improve Blood Pressure Control?  ?Patient states she doesn't have a great appetite ?Breakfast - patient will have eggs bacon toast or oatmeal on days she doesn't have dialysis ?Lunch - patient doesn't generally eat lunch ?Dinner - patient will have pork chops or chicken and some vegetable, she is limited due to Coumadin.  ? ?What exercise is being done to improve your Blood Pressure Control?  ?Patient states she is not able to do much, she has a walker she uses when she is outside. ?  ?Note: ?Patient states she does have an appointment with GI tomorrow.  ? ?Adherence Review: ?Is the patient currently on ACE/ARB medication? No ?Does the patient have >5 day gap between last estimated fill dates? No ? ?Care Gaps: ?AWV - scheduled for 08/12/2022 ?Last BP - 164/75 on 09/17/2021 ?Shingrix - never done ?Covid vaccine - overdue ?  ?Star Rating Drug: ?Atorvastatin 10 mg - last filled 09/12/2021 63 DS at [facility] ? ?Gennie Alma CMA  ?Clinical Pharmacist Assistant ?[phone] ? ?

## 2021-09-19 ENCOUNTER — Encounter: Payer: Self-pay | Admitting: Nurse Practitioner

## 2021-09-19 ENCOUNTER — Other Ambulatory Visit (INDEPENDENT_AMBULATORY_CARE_PROVIDER_SITE_OTHER): Payer: Medicare Other

## 2021-09-19 ENCOUNTER — Ambulatory Visit (INDEPENDENT_AMBULATORY_CARE_PROVIDER_SITE_OTHER): Payer: Medicare Other | Admitting: Licensed Clinical Social Worker

## 2021-09-19 ENCOUNTER — Telehealth: Payer: Self-pay

## 2021-09-19 ENCOUNTER — Ambulatory Visit (INDEPENDENT_AMBULATORY_CARE_PROVIDER_SITE_OTHER): Payer: Medicare Other | Admitting: Nurse Practitioner

## 2021-09-19 VITALS — BP 152/68 | HR 79 | Ht 64.0 in | Wt 184.1 lb

## 2021-09-19 DIAGNOSIS — N186 End stage renal disease: Secondary | ICD-10-CM

## 2021-09-19 DIAGNOSIS — Z992 Dependence on renal dialysis: Secondary | ICD-10-CM

## 2021-09-19 DIAGNOSIS — D638 Anemia in other chronic diseases classified elsewhere: Secondary | ICD-10-CM

## 2021-09-19 DIAGNOSIS — R195 Other fecal abnormalities: Secondary | ICD-10-CM

## 2021-09-19 DIAGNOSIS — G894 Chronic pain syndrome: Secondary | ICD-10-CM

## 2021-09-19 DIAGNOSIS — Z7901 Long term (current) use of anticoagulants: Secondary | ICD-10-CM

## 2021-09-19 DIAGNOSIS — I129 Hypertensive chronic kidney disease with stage 1 through stage 4 chronic kidney disease, or unspecified chronic kidney disease: Secondary | ICD-10-CM

## 2021-09-19 DIAGNOSIS — F331 Major depressive disorder, recurrent, moderate: Secondary | ICD-10-CM

## 2021-09-19 LAB — CBC WITH DIFFERENTIAL/PLATELET
Basophils Absolute: 0 10*3/uL (ref 0.0–0.1)
Basophils Relative: 0.5 % (ref 0.0–3.0)
Eosinophils Absolute: 0.3 10*3/uL (ref 0.0–0.7)
Eosinophils Relative: 6.4 % — ABNORMAL HIGH (ref 0.0–5.0)
HCT: 29.7 % — ABNORMAL LOW (ref 36.0–46.0)
Hemoglobin: 9.6 g/dL — ABNORMAL LOW (ref 12.0–15.0)
Lymphocytes Relative: 32.3 % (ref 12.0–46.0)
Lymphs Abs: 1.5 10*3/uL (ref 0.7–4.0)
MCHC: 32.2 g/dL (ref 30.0–36.0)
MCV: 91.3 fl (ref 78.0–100.0)
Monocytes Absolute: 0.6 10*3/uL (ref 0.1–1.0)
Monocytes Relative: 12.6 % — ABNORMAL HIGH (ref 3.0–12.0)
Neutro Abs: 2.3 10*3/uL (ref 1.4–7.7)
Neutrophils Relative %: 48.2 % (ref 43.0–77.0)
Platelets: 174 10*3/uL (ref 150.0–400.0)
RBC: 3.25 Mil/uL — ABNORMAL LOW (ref 3.87–5.11)
RDW: 15.1 % (ref 11.5–15.5)
WBC: 4.8 10*3/uL (ref 4.0–10.5)

## 2021-09-19 LAB — COMPREHENSIVE METABOLIC PANEL
ALT: 14 U/L (ref 0–35)
AST: 17 U/L (ref 0–37)
Albumin: 4.2 g/dL (ref 3.5–5.2)
Alkaline Phosphatase: 115 U/L (ref 39–117)
BUN: 83 mg/dL — ABNORMAL HIGH (ref 6–23)
CO2: 26 mEq/L (ref 19–32)
Calcium: 10.4 mg/dL (ref 8.4–10.5)
Chloride: 92 mEq/L — ABNORMAL LOW (ref 96–112)
Creatinine, Ser: 11.31 mg/dL (ref 0.40–1.20)
GFR: 2.96 mL/min — CL (ref >60.00)
Glucose, Bld: 95 mg/dL (ref 70–99)
Potassium: 5.5 mEq/L — ABNORMAL HIGH (ref 3.5–5.1)
Sodium: 131 mEq/L — ABNORMAL LOW (ref 135–145)
Total Bilirubin: 0.6 mg/dL (ref 0.2–1.2)
Total Protein: 8.3 g/dL (ref 6.0–8.3)

## 2021-09-19 NOTE — Patient Instructions (Signed)
Please proceed to the basement level for lab work before leaving today. Press "B" on the elevator. The lab is located at the first door on the left as you exit the elevator. ? ?HEALTHCARE LAWS AND MY CHART RESULTS:  ? ?Due to recent changes in healthcare laws, you may see results of your imaging and/or laboratory studies on MyChart before I have had a chance to review them.  I understand that in some cases there may be results that are confusing or concerning to you. Please understand that not all results are received at the same time and often I may need to interpret multiple results in order to provide you with the best plan of care or course of treatment. Therefore, I ask that you please give me 48 hours to thoroughly review all your results before contacting my office for clarification.  ? ?RECOMMENDATIONS: ? ?Desitin: Apply a small amount to the external and internal anal area three times a day as needed. ?Continue taking Pantoprazole 40 MG twice a day. ?Follow up with Cardiology regarding Coumadin INR checks with positive blood in your stool. ?Our office will contact you to schedule a small bowel enteroscopy and a possible colonoscopy. ?Go to the emergency room if you develop worsening dark or bloody stools. ? ?Thank you for trusting me with your gastrointestinal care!   ? ?Noralyn Pick, CRNP ? ? ? ?BMI: ? ?If you are age 65 or older, your body mass index should be between 23-30. Your Body mass index is 31.6 kg/m?Marland Kitchen If this is out of the aforementioned range listed, please consider follow up with your Primary Care Provider. ? ?If you are age 75 or younger, your body mass index should be between 19-25. Your Body mass index is 31.6 kg/m?Marland Kitchen If this is out of the aformentioned range listed, please consider follow up with your Primary Care Provider.  ? ?MY CHART: ? ?The [facility] providers would like to encourage you to use [facility] to communicate with providers for non-urgent requests or questions.  Due  to long hold times on the telephone, sending your provider a message by [facility] may be a faster and more efficient way to get a response.  Please allow 48 business hours for a response.  Please remember that this is for non-urgent requests.  ? ?

## 2021-09-19 NOTE — Progress Notes (Signed)
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 pylori negative gastritis, subcentimeter hyperplastic gastric poyps, multiple adenomatous colon polyps and GI bleed secondary to an AVM at the splenic flexure 01/2021. S/P partial colectomy secondary to diverticular disease 2019.   She is followed by Dr. Hilarie Fredrickson.  She presents to our office today for further evaluation regarding dark stools and passing dark red blood and bright red blood per rectum which started 2 weeks ago.  She reporting passing a very dark brown soft stools with a moderate amount of dark red blood seen on the toilet tissue and in the toilet water for 3 to 4 days.  She then started passing dark formed stools with bright red blood per the rectum for the next 3 to 5 days.  She passes blood from the rectum only when having a bowel movement.  Over the last week, she has seen less bright red blood with her stools.  Today, she passed a dark stool with a small amount of dark blood.  She has intermittent generalized abdominal pain which comes and goes for the past 2 to 3 weeks.  She describes feeling sick to her stomach.  No vomiting.  No heartburn.  She is on Pantoprazole 40 mg p.o. twice daily.  She takes Linzess daily or every other day.  She does not have a bowel movement if she does not take Linzess.  She remains on Coumadin for atrial fibrillation.  Her INR level was 4.2 on 09/17/2021.  The A-fib clinic reduced her Coumadin to 5mg  six days weekly and 7.5 mg every Tuesday (previously took 7.5 mg 3 days weekly).  She is scheduled for repeat INR 09/26/2021.  Hemoglobin level was 11.4 on 08/28/2021.  Hemoglobin level done 09/18/2021 during dialysis, results are pending.  Her most  recent EGD was 05/31/2021 which showed a normal esophagus which was empirically dilated due to her complaints of dysphagia, multiple nonbleeding gastric hyperplastic polyps, melanosis in the antrum and polypoid mucosa in the second portion of the duodenum present to the site of her prior EMR.  Biopsies were negative for recurrent adenoma.  She underwent an EGD 01/31/2021 with Dr. Rush Landmark to follow up on her prior duodenal adenoma s/p EMR. She reported having bright red rectal bleeding the week prior to this EGD.  Dr. Rush Landmark completed a rectal exam which identified maroon blood.  The patient was admitted for observation, Coumadin was held and she underwent a colonoscopy at [facility] by Dr. Havery Moros 02/01/2021 which resulted in a poor prep, stool and maroon blood were in the entire examined colon and a single bleeding punctate vessel was in the splenic flexure treated with APC.  Her hemoglobin level dropped from 9.0-8.3 but stabilized without any further active GI bleeding and she was discharged home 718/2022.  She underwent a colonoscopy 11/17/2019 and 9 tubular adenomatous polyps measuring 5 to 20 mm were removed from the colon.  Labs 08/28/2021: Hemoglobin 11.4.  Hematocrit 35.6.  INR 1.84.  Iron 48.  TIBC 231.  Sodium 140.  BUN 10.  Creatinine 9.25. Labs 09/04/2021: Hemoglobin 11.2. Labs 09/05/2021: INR 1.7. Labs 09/11/2021: Hemoglobin 10.9. Labs 09/17/2021: INR 4.2.   GI PROCEDURES:  EGD 05/31/2021 due to weight loss  and dysphagia: Normal esophagus. - No endoscopic esophageal abnormality to explain patient's dysphagia. Esophagus dilated - No endoscopic esophageal abnormality to explain patient's dysphagia. Esophagus dilated with 52 Fr Maloney - Multiple gastric polyps. Biopsied/sampled. - Melanotic mucosa in the antrum. - Polypoid mucosa in the second portion of the duodenum in area of previous EMR. Biopsied to exclude recurrent adenoma. 1. Surgical [P], duodenal polypoid mucosa - BENIGN  DUODENAL MUCOSA - NO ACUTE INFLAMMATION, VILLOUS BLUNTING OR INCREASED INTRAEPITHELIAL LYMPHOCYTES 2. Surgical [P], gastric polyps, polyp (4) - HYPERPLASTIC GASTRIC POLYP - NO H. PYLORI, INTESTINAL METAPLASIA OR MALIGNANCY IDENTIFIED  EGD 01/31/2021: - No gross lesions in esophagus. Z-line regular, 37 cm from the incisors. - 1 cm hiatal hernia. - Four gastric polyps - inflammatory in appearance. - Darkened texture changed mucosa in the antrum. - No other gross lesions in the stomach. - Duodenal scar in D2 from previous TVA resection. Some polypoid tissue present - query recurrence vs clip artifact. Sampled off via snare. Clips (MR conditional) were placed to close the region and decrease risk of bleeding. - Major papilla is prominent. Biopsied to rule out adenomatous change. - No other visualized gross lesions in the entire examined duodenum.  Colonoscopy 02/01/2021 by Dr. Havery Moros - Preparation of the colon was poor. - Hemorrhoids found on perianal exam. - Stool and maroon blood in the entire examined colon. - A single bleeding punctate vessel in the splenic flexure. Treated with argon plasma coagulation (APC). - Internal hemorrhoids.  EGD 11/17/2019: - Normal esophagus. - A single pedunculated gastric polyp, with a few other much smaller, sessile gastric polyps. Gastritis. Biopsied. - Duodenal mass. Biopsied. - Patient has a contact number  Colonoscopy 11/17/2019: - One 10 mm polyp in the cecum, removed with a cold snare. Resected and retrieved. - One 20 mm polyp in the ascending colon, removed with a hot snare. Resected and retrieved. - Five 5 to 10 mm polyps in the ascending colon, removed with a cold snare. Resected and retrieved. - Two 5 to 7 mm polyps in the transverse colon, removed with a cold snare. Resected and retrieved. - Patent end-to-side colo-colonic anastomosis, characterized by healthy appearing mucosa. - Internal hemorrhoids. 1. Surgical [P], 2nd portion of  duodenum (mass) - SUPERFICIAL FRAGMENTS OF A TUBULOVILLOUS ADENOMA. - SEE COMMENT. 2. Surgical [P], gastric antrum and gastric body - CHRONIC INACTIVE GASTRITIS WITH FOCAL INTESTINAL METAPLASIA. - THERE IS NO EVIDENCE OF HELICOBACTER PYLORI, DYSPLASIA OR MALIGNANCY. - SEE COMMENT. 3. Surgical [P], colon, cecum, polyp - TUBULAR ADENOMA(S). - HIGH GRADE DYSPLASIA IS NOT IDENTIFIED. 4. Surgical [P], colon, ascending, polyp (6) - TUBULAR ADENOMA(S). - HIGH GRADE DYSPLASIA IS NOT IDENTIFIED. 5. Surgical [P], colon, transverse, polyp (2) - TUBULAR ADENOMA(S). - HIGH GRADE DYSPLASIA IS NOT IDENTIFIED.  Current Outpatient Medications on File Prior to Visit  Medication Sig Dispense Refill   atorvastatin (LIPITOR) 10 MG tablet Take 1 tablet (10 mg total) by mouth daily. 30 tablet 3   B Complex-C-Zn-Folic Acid (DIALYVITE 973 WITH ZINC) 0.8 MG TABS Take 1 tablet by mouth in the morning.     CALCITRIOL PO Take by mouth every Monday, Wednesday, and Friday with hemodialysis.     ethyl chloride spray Apply 1 application topically every Monday, Wednesday, and Friday with hemodialysis.  12   linaclotide (LINZESS) 145 MCG CAPS capsule TAKE 1 CAPSULE BY MOUTH ONCE DAILY BEFORE BREAKFAST 90 capsule 2   Methoxy PEG-Epoetin Beta (MIRCERA IJ) Mircera     midodrine (PROAMATINE) 10 MG tablet Take  1 tablet (10 mg total) by mouth 3 (three) times daily as needed (low BP and dizziness and lightheadedness symptoms.). 90 tablet 3   pantoprazole (PROTONIX) 40 MG tablet Take 1 tablet (40 mg total) by mouth 2 (two) times daily. 180 tablet 2   sevelamer carbonate (RENVELA) 800 MG tablet Take 800 mg by mouth 3 (three) times daily.     warfarin (COUMADIN) 5 MG tablet Take 1 tablet (5 mg total) by mouth daily. Or as directed by coumadin clinic. (Patient taking differently: Take 5-7.5 mg by mouth See admin instructions. Take 1 tablet (5 mg) by mouth on Sundays, Mondays, Wednesdays, Fridays & Saturdays in the morning. Take 1.5  tablets (7.5 mg) by mouth on Tuesdays & Thursdays in the morning) 100 tablet 3   Current Facility-Administered Medications on File Prior to Visit  Medication Dose Route Frequency Provider Last Rate Last Admin   0.9 %  sodium chloride infusion  250 mL Intravenous PRN Marty Heck, MD       sodium chloride flush (NS) 0.9 % injection 3 mL  3 mL Intravenous Q12H Marty Heck, MD       sodium chloride flush (NS) 0.9 % injection 3 mL  3 mL Intravenous PRN Marty Heck, MD       Allergies  Allergen Reactions   Latex Rash   Penicillins Other (See Comments)    Yeast infection / Childhood   Sulfa Antibiotics Rash   Tape Other (See Comments)    Plastic, silicone, and paper tape causes bruising and pulls off skin. Cloth tape works fine   Current Medications, Allergies, Past Medical History, Past Surgical History, Family History and Social History were reviewed in [employer] record.  Review of Systems:   Constitutional: Negative for fever, sweats, chills or weight loss.  Respiratory: Negative for shortness of breath.   Cardiovascular: Negative for chest pain, palpitations and leg swelling.  Gastrointestinal: See HPI.  Musculoskeletal: Negative for back pain or muscle aches.  Neurological: Negative for dizziness, headaches or paresthesias.   Physical Exam: BP (!) 152/68    Pulse 79    Ht 5\' 4"  (1.626 m)    Wt 184 lb 2 oz (83.5 kg)    SpO2 99%    BMI 31.60 kg/m   General: 76 year old female in NAD.  Head: Normocephalic and atraumatic. Eyes: No scleral icterus. Conjunctiva pink . Ears: Normal auditory acuity. Mouth: Dentition intact. No ulcers or lesions.  Lungs: Clear throughout to auscultation. Heart: Regular rate and rhythm, no murmur. Abdomen: Soft, nontender and nondistended. No masses or hepatomegaly. Normal bowel sounds x 4 quadrants.  Rectal: Very dark brown, nearly black pasty stool grossly guaiac +. External hemorrhoids without active  bleeding. Anal hemorrhoids mildly friable without active bleeding.  CMA Melissa present during exam. Musculoskeletal: Symmetrical with no gross deformities. Extremities: No edema. RUE fistula with + bruit and thrill.  Neurological: Alert oriented x 4. No focal deficits.  Psychological: Alert and cooperative. Normal mood and affect  Assessment and Recommendations:  25) 76 year old female with acute on chronic anemia, very dark stools (nearly melenic) grossly guaiac positive with passing dark red blood and bright red blood per the rectum for the past 2 weeks.  Generalized abdominal pain.  History of GI bleed 01/2021 secondary to a bleeding AVM at the splenic flexure treated with APC. Hg 11.4 -> 11.2 -> 10.9.  -CBC, iron panel now -Small bowel enteroscopy +/- repeat colonoscopy at [facility] to rule out small  intestinal and colonic AVMs, to discuss further with Dr. Hilarie Fredrickson -CTAP with oral contrast if abdominal pain worsens  2) Afib on Coumadin. Supratherapeutic INR. INR 4.2 on 09/17/2021. Coumadin dose was reduced by the afib clinic. Repeat INR scheduled 09/26/2021.  -Coumadin will need to be held if she demonstrates significant active GI bleeding  3) History of tubulovillous adenoma of the duodenum s/p EMR 2021. Her most recent EGD 05/31/2021 without evidence of recurrent adenoma  4) History of multiple tubular adenomatous colon polyps per colonoscopy 10/2019 -Next colon polyp surveillance colonoscopy due 10/2022  5) GERD -Continue Pantoprazole 40 mg twice daily  6) Chronic constipation -Continue Linzess 145 mcg daily  7) ESRD on HD

## 2021-09-19 NOTE — Telephone Encounter (Signed)
Patient called and stated Dr. Letta Pate wanted her to call in a couple days to update on pain. She is still having pain and rates it between 6-7. She stated the pain is not as bad as it was on Tuesday 09/17/21 before her procedure. ?

## 2021-09-20 ENCOUNTER — Telehealth: Payer: Medicare Other

## 2021-09-20 ENCOUNTER — Telehealth: Payer: Self-pay | Admitting: Nurse Practitioner

## 2021-09-20 ENCOUNTER — Telehealth: Payer: Self-pay | Admitting: Family Medicine

## 2021-09-20 DIAGNOSIS — N2581 Secondary hyperparathyroidism of renal origin: Secondary | ICD-10-CM | POA: Diagnosis not present

## 2021-09-20 DIAGNOSIS — Z8719 Personal history of other diseases of the digestive system: Secondary | ICD-10-CM

## 2021-09-20 DIAGNOSIS — D631 Anemia in chronic kidney disease: Secondary | ICD-10-CM | POA: Diagnosis not present

## 2021-09-20 DIAGNOSIS — Z992 Dependence on renal dialysis: Secondary | ICD-10-CM | POA: Diagnosis not present

## 2021-09-20 DIAGNOSIS — N186 End stage renal disease: Secondary | ICD-10-CM | POA: Diagnosis not present

## 2021-09-20 DIAGNOSIS — I480 Paroxysmal atrial fibrillation: Secondary | ICD-10-CM

## 2021-09-20 LAB — IRON,TIBC AND FERRITIN PANEL
%SAT: 96 % (calc) — ABNORMAL HIGH (ref 16–45)
Ferritin: 2001 ng/mL — ABNORMAL HIGH (ref 16–288)
Iron: 222 ug/dL — ABNORMAL HIGH (ref 45–160)
TIBC: 232 mcg/dL (calc) — ABNORMAL LOW (ref 250–450)

## 2021-09-20 NOTE — Telephone Encounter (Signed)
I called the patient's daughter [name] and answered her questions, I summarized the findings and plan regarding her patient's office visit yesterday as well as information in today's phone note.  Her daughter is aware her mother should go to the emergency room over the weekend if she develops chest pain, shortness of breath, palpitations, worsening dizziness, or increased rectal bleeding or dark-colored stools. ?

## 2021-09-20 NOTE — Telephone Encounter (Signed)
Called and reviewed with pt. Pt reviewed with her dialysis provider and is being prepped for IV iron infusion today. Pt reports that she is still having abdominal discomfort and GI bleed concerns. Recommended pt hold her warfarin for 3 days. Pt to come for an INR recheck on 09/24/21.

## 2021-09-20 NOTE — Telephone Encounter (Signed)
ICD-10-CM   ?1. Paroxysmal atrial fibrillation (HCC)  I48.0 Ambulatory referral to Cardiac Electrophysiology  ?  ?2. History of GI bleed  Z87.19 Ambulatory referral to Cardiac Electrophysiology  ?  ? ? ?Orders Placed This Encounter  ?Procedures  ? Ambulatory referral to Cardiac Electrophysiology  ?  Referral Priority:   Routine  ?  Referral Type:   Consultation  ?  Referral Reason:   Specialty Services Required  ?  Requested Specialty:   Cardiology  ?  Number of Visits Requested:   1  ? ? ?

## 2021-09-20 NOTE — Telephone Encounter (Signed)
Inbound call from patients daughter, she stated that here mother was trying to discuss the visit with her and she could not relay everything to her. Daughter is seeking advice about the visit she had with [facility] yesterday. Please advise.  ?

## 2021-09-20 NOTE — Telephone Encounter (Signed)
Dr. Nadyne Coombes,  I saw Jane [name] in our GI clinic yesterday, she has recurrence of GI bleeding, she is seeing darker red blood and bright red blood per the rectum. Stool nearly melenic, grossly heme positive.  Hemodynamically stable.  She remains on Coumadin .Her INR was 4.2 on 09/17/2021 and the afib clinic reduced her Coumadin to 5mg  daily six days weekly and 7.5mg  every Tuesday with plans to repeat INR in one week. ? ?Hg level continues to drop. Hg 11.4 -> 11.2 -> 10.9 and Hg 9.6 on 09/19/2021.  ? ?Labs 08/28/2021: Hemoglobin 11.4.  Hematocrit 35.6.  INR 1.84.  Iron 48. TIBC 231.  Sodium 140.  BUN 10.  Creatinine 9.25. ?Labs 09/04/2021: Hemoglobin 11.2. ?Labs 09/05/2021: INR 1.7. ?Labs 09/11/2021: Hemoglobin 10.9. ?Labs 09/17/2021: INR 4.2.  ? ?She will likely require small bowel enteroscopy and a repeat colonoscopy.  She has a history of GI bleed 01/2021 and a colonoscopy at that time showed a bleeding AVM at this clinic flexure.  She is also at risk for small bowel AVMs.  I will communicate with Dr. Hilarie Fredrickson regarding the timing of her endoscopic evaluation, he is not in the office today. ? ?I called her this morning, she denies having any further rectal rectal bleeding or dark stools since I saw her yesterday.  No BM yet today.  She is currently at dialysis.  I advised the patient to inform her nephrologist Dr. Lawson Radar that her hemoglobin level is 9.6 and to notify the dialysis nurse of this result.  I defer decision regarding any future blood transfusion and IV iron to Dr. Carolin Sicks.  She complains of fatigue otherwise she is asymptomatic. ? ?Dr. Einar Gip,  I defer further Coumadin instructions to you.  I advised the patient to contact you ASAP this morning.  Consider holding Coumadin for few days and recheck INR on Monday.  ? ?She was advised to present to the ED if she develops chest pain, shortness of breath, dizziness, profound fatigue or worsening rectal bleeding or frequent loose black stools. ? ? ?Dr. [name], [facility]  keeping you in the loop ? ? ? ? ? ? ? ? ? ? ?

## 2021-09-20 NOTE — Telephone Encounter (Signed)
If at risk for GI bleed and she needs GI work up, fine to hold warfarin for 5 days and proceed. I will plan on referring her for Left atrial appendage occlusion with [facility].

## 2021-09-20 NOTE — Telephone Encounter (Signed)
Pt is calling and  fainted  Pt was sent to triage nurse . The access nurse sent over a detailed encounter. Pt is sch for an appt with dr [name] on 09-24-2021 at 2 pm ?

## 2021-09-20 NOTE — Telephone Encounter (Signed)
I called and spoke with patient. She is feeling better. Her cardiologist was made aware of the fainting spell at Dialysis. She has an appointment scheduled for Tuesday to see you for follow up. ? ?Just an FYI. ?

## 2021-09-23 ENCOUNTER — Other Ambulatory Visit: Payer: Self-pay

## 2021-09-23 ENCOUNTER — Telehealth: Payer: Self-pay | Admitting: Nurse Practitioner

## 2021-09-23 ENCOUNTER — Telehealth: Payer: Self-pay

## 2021-09-23 DIAGNOSIS — K922 Gastrointestinal hemorrhage, unspecified: Secondary | ICD-10-CM

## 2021-09-23 DIAGNOSIS — Z8719 Personal history of other diseases of the digestive system: Secondary | ICD-10-CM

## 2021-09-23 DIAGNOSIS — D631 Anemia in chronic kidney disease: Secondary | ICD-10-CM | POA: Diagnosis not present

## 2021-09-23 DIAGNOSIS — N2581 Secondary hyperparathyroidism of renal origin: Secondary | ICD-10-CM | POA: Diagnosis not present

## 2021-09-23 DIAGNOSIS — R109 Unspecified abdominal pain: Secondary | ICD-10-CM

## 2021-09-23 DIAGNOSIS — N186 End stage renal disease: Secondary | ICD-10-CM | POA: Diagnosis not present

## 2021-09-23 DIAGNOSIS — Z7901 Long term (current) use of anticoagulants: Secondary | ICD-10-CM

## 2021-09-23 DIAGNOSIS — I480 Paroxysmal atrial fibrillation: Secondary | ICD-10-CM

## 2021-09-23 DIAGNOSIS — Z992 Dependence on renal dialysis: Secondary | ICD-10-CM | POA: Diagnosis not present

## 2021-09-23 MED ORDER — NA SULFATE-K SULFATE-MG SULF 17.5-3.13-1.6 GM/177ML PO SOLN: 1.0000 | Freq: Once | ORAL | 0 refills | Status: AC

## 2021-09-23 NOTE — Telephone Encounter (Signed)
Warfarin has been held due to recent blood in stool and 1 to 1-1/2 g/dL drop in hemoglobin ?See office note recently by Jane Best, NP for details ?If GI bleeding has stopped and hemoglobin stable likely okay to resume warfarin with close observation for recurrent bleeding ?We will need to monitor hemoglobin closely until planned small bowel enteroscopy and colonoscopy in the outpatient hospital setting ?If recurrent melena or hematochezia/rectal bleeding she should go to the emergency department for probable observation and inpatient endoscopic evaluation ?

## 2021-09-23 NOTE — Progress Notes (Signed)
Chief Complaint  Patient presents with   Follow-up    Passed out at dialysis   HPI: [name] is a 76 y.o. female, who is here today concerned about dizziness and syncopal episodes after dialysis. States that for the past 2 weeks she has "passed out" at home after coming back from dialysis,not sure about duration, and attributed to low BP's.  Feeling "sick and weak" during and after dialysis. 80-90's/60 after dialysis, has headache and chest tightness. No associated SOB,palpitations,or diaphoresis.  Chest tightness also when she is in bed and with exertion. She has not had urine for 4-5 weeks. According to pt, dialysis time was decreased last week but it did not seem to help. Last Friday her dry wt was changed, next dialysis session tomorrow.  During the days she does not have dialysis BP's 160/70 and an hour later can be low 100's-130's/70's. Hx of HTN, since her last visit, 05/14/21, she has discontinued antihypertensive medication. Dizziness is exacerbated by movement and standing/walking, alleviated by rest.  Today she saw her cardiologist. Currently she is on midodrine 10 mg 3 times daily. She states that her nephrologist recommended increasing Midodrine to 1.5 tab tid.  GERD: +Heartburn and regurgitation/vomiting clear fluids. She is on Pantoprazole 40 mg bid, changed from Omeprazole in 07/2021.  Rectal bleeding intermittent for 3 weeks, exacerbated by defecation, "black-tarry" stool with red blood in toilet after defecation. Abdominal cramps, intermittent, alleviated  after bowel movements and exacerbated by food intake. She is following with GI. Constipation , she does not have a bowel movement unless she takes Linzess 290 mcg, which causes diarrhea.  Atrial fib: She is not longer on coumadin. Planning on Watchman procedure, she has appt for consultation with Dr Burt Knack on 10/17/21. INR was elevated at 4.9 a few weeks ago. INR 1.9 today. She needs a CBC done and be  faxed to GI. Colonoscopy under anesthesia on 10/01/21.  Lab Results  Component Value Date   WBC 4.8 09/19/2021   HGB 9.6 (L) 09/19/2021   HCT 29.7 (L) 09/19/2021   MCV 91.3 09/19/2021   PLT 174.0 09/19/2021   She "cannot sleep" O2 supplementation was recommended after having her sleep study done.  She has seen Dr Letta Pate for her back pain and she is glad with treatment results, back pain has improved. She is not longer using a cane.  Review of Systems  Constitutional:  Positive for activity change and fatigue. Negative for appetite change and fever.  HENT:  Negative for mouth sores, nosebleeds and sore throat.   Eyes:  Negative for redness and visual disturbance.  Respiratory:  Negative for cough and wheezing.   Gastrointestinal:  Positive for nausea. Negative for abdominal distention.  Genitourinary:  Negative for vaginal bleeding.  Musculoskeletal:  Positive for arthralgias and back pain.  Skin:  Negative for rash.  Neurological:  Negative for seizures, facial asymmetry and speech difficulty.  Psychiatric/Behavioral:  Negative for confusion. The patient is nervous/anxious.   Rest see pertinent positives and negatives per HPI.  Current Outpatient Medications on File Prior to Visit  Medication Sig Dispense Refill   atorvastatin (LIPITOR) 10 MG tablet Take 1 tablet (10 mg total) by mouth daily. 30 tablet 3   B Complex-C-Zn-Folic Acid (DIALYVITE 756 WITH ZINC) 0.8 MG TABS Take 1 tablet by mouth in the morning.     CALCITRIOL PO Take by mouth every Monday, Wednesday, and Friday with hemodialysis.     ethyl chloride spray Apply 1 application topically every Monday, Wednesday,  and Friday with hemodialysis.  12   linaclotide (LINZESS) 145 MCG CAPS capsule TAKE 1 CAPSULE BY MOUTH ONCE DAILY BEFORE BREAKFAST 90 capsule 2   Methoxy PEG-Epoetin Beta (MIRCERA IJ) Mircera     midodrine (PROAMATINE) 10 MG tablet Take 1 tablet (10 mg total) by mouth 3 (three) times daily as needed (low BP and  dizziness and lightheadedness symptoms.). (Patient taking differently: Take 10 mg by mouth 3 (three) times daily as needed (low BP and dizziness and lightheadedness symptoms. SBP <130 mmHg).) 90 tablet 3   pantoprazole (PROTONIX) 40 MG tablet Take 1 tablet (40 mg total) by mouth 2 (two) times daily. 180 tablet 2   sevelamer carbonate (RENVELA) 800 MG tablet Take 800 mg by mouth 3 (three) times daily.     warfarin (COUMADIN) 5 MG tablet Take 1 tablet (5 mg total) by mouth daily. Or as directed by coumadin clinic. (Patient taking differently: Take 5-7.5 mg by mouth See admin instructions. Take 1 tablet (5 mg) by mouth on Sundays, Mondays, Wednesdays, Fridays & Saturdays in the morning. Take 1.5 tablets (7.5 mg) by mouth on Tuesdays & Thursdays in the morning) 100 tablet 3   No current facility-administered medications on file prior to visit.    Past Medical History:  Diagnosis Date   Acid reflux    Anemia of chronic disease    Arthritis    Asthma    Atrial fibrillation ([place])    Bilateral carotid bruits    Complication of anesthesia    "hard to wake up, I have sleep apnea" no CPAP   Diverticulitis    Duodenal ulcer    Dysrhythmia    Afib   ESRD (end stage renal disease) ([place])    MWF [place]   Headache    Heart murmur    History of blood transfusion    Hypertension    Malaise and fatigue    Orthostatic hypotension    Shortness of breath    " when I walk to fast"   Sleep apnea    Syncope    Tubulovillous adenoma of colon    Allergies  Allergen Reactions   Latex Rash   Penicillins Other (See Comments)    Yeast infection / Childhood   Sulfa Antibiotics Rash   Tape Other (See Comments)    Plastic, silicone, and paper tape causes bruising and pulls off skin. Cloth tape works fine    Social History   Socioeconomic History   Marital status: Divorced    Spouse name: Not on file   Number of children: 4   Years of education: 14   Highest education level: Associate  degree: occupational, Hotel manager, or vocational program  Occupational History   Occupation: Retired  Tobacco Use   Smoking status: Never    Passive exposure: Never   Smokeless tobacco: Never  Vaping Use   Vaping Use: Never used  Substance and Sexual Activity   Alcohol use: No   Drug use: No   Sexual activity: Never  Other Topics Concern   Not on file  Social History Narrative   HH 1   Divorced   Outpatient dialysis Mon, Wed, Fri   4 children: 1 daughter locally is an Designer, multimedia and 3 sons in [place] Determinants of Health   Financial Resource Strain: High Risk   Difficulty of Paying Living Expenses: Very hard  Food Insecurity: Food Insecurity Present   Worried About Charity fundraiser in the Last Year: Often  true   Ran Out of Food in the Last Year: Often true  Transportation Needs: Unmet Transportation Needs   Lack of Transportation (Medical): Yes   Lack of Transportation (Non-Medical): No  Physical Activity: Inactive   Days of Exercise per Week: 0 days   Minutes of Exercise per Session: 0 min  Stress: Stress Concern Present   Feeling of Stress : Very much  Social Connections: Not on file   Vitals:   09/24/21 1350 09/24/21 1545  BP: (!) 148/60 (!) 143/60  Pulse: 70   Resp: 16    Body mass index is 30.69 kg/m.  Physical Exam Vitals and nursing note reviewed.  Constitutional:      General: She is not in acute distress.    Appearance: She is well-developed.  HENT:     Head: Normocephalic and atraumatic.     Mouth/Throat:     Mouth: Mucous membranes are moist.     Pharynx: Oropharynx is clear.  Eyes:     Conjunctiva/sclera: Conjunctivae normal.  Cardiovascular:     Rate and Rhythm: Normal rate and regular rhythm.     Pulses:          Dorsalis pedis pulses are 2+ on the right side and 2+ on the left side.     Heart sounds: Murmur (SEM II/VI RUSB) heard.  Pulmonary:     Effort: Pulmonary effort is normal. No respiratory distress.     Breath  sounds: Normal breath sounds.  Abdominal:     Palpations: Abdomen is soft.     Tenderness: There is no abdominal tenderness.  Lymphadenopathy:     Cervical: No cervical adenopathy.  Skin:    General: Skin is warm.     Findings: No erythema or rash.  Neurological:     General: No focal deficit present.     Mental Status: She is alert and oriented to person, place, and time.     Cranial Nerves: No cranial nerve deficit.     Comments: Stable gait, not assisted.  Psychiatric:        Mood and Affect: Mood is anxious.     Comments: Well groomed, good eye contact.   ASSESSMENT AND PLAN:  Ms.Shaolin was seen today for follow-up.  Diagnoses and all orders for this visit: Orders Placed This Encounter  Procedures   For home use only DME Other see comment   CBC with Differential/Platelets   Syncope, unspecified syncope type It is happening after dialysis and attributed to hypotensive episodes. Some of her chronic medical problems can be contributing factors. Orthostatic hypotension on Midodrine 10 mg tid. BP today mildly elevated. Evaluated by her cardiologist today. I do not think further work up is needed today. Instructed about warning signs.  GERD (gastroesophageal reflux disease) Chest discomfort, nausea, and vomiting can be caused by this problem. She is already on Protonix 40 mg twice daily. Continue GERD precautions. Following with GI.  GI bleed Reporting some melena and red bright blood per rectum with defecation. Problem has not improved after stopping Coumadin. Instructed about warning signs. Pending colonoscopy. CBC will be repeated today and faxed to her GI.  Constipation Linzess 290 mcg helps but it causes diarrhea.  OTC medications have not help and tried lower dose Linzess unsuccessfully. Continue Linzess 290 mcg q 3rd day. Adequate fiber and fluid intake.   Atherosclerosis of aorta ([place]) Seen on imaging in 09/2020. She is on Atorvastatin.  Paroxysmal  atrial fibrillation [facility]) She saw cardiologist today. Rhythm controlled today. Because  GI bleeding,she is not longer on Coumadin. Pending consultation to discussed Watchman procedure.  ESRD on hemodialysis ([place]) For the past few months she has not tolerated dialysis well, developing hypotension and syncopal episodes. Dry wt has been adjusted, so hopefully this will help. She is having difficulty meeting protein intake requirements, food intake aggravate abdominal cramps and nausea. Recommend trying dairy free Ensure, small bites of protein containing foods through the day, small bites at the time.  I spent a total of 46 minutes in both face to face and non face to face activities for this visit on the date of this encounter. During this time history was obtained and documented, examination was performed, prior labs reviewed, and assessment/plan discussed.  Return if symptoms worsen or fail to improve, for Keep next appt..  Jhene Westmoreland G. [name], MD  [facility]. [place] office.

## 2021-09-23 NOTE — Telephone Encounter (Signed)
Pt is scheduled to have INR draw tomorrow. Would it be possible to add on a CBC per Carl Best, NP's request? ?

## 2021-09-23 NOTE — Telephone Encounter (Signed)
Mickel Baas, pls contact the patient's cardiologist Dr. Einar Gip and request an official Coumadin clearance/hold instructions prior to her enteroscopy and colonoscopy scheduled 10/01/2021.  She is scheduled to have a repeat INR level tomorrow per Dr. Einar Gip. Note,  I sent Ammie a message earlier today to coordinate getting a CBC done at the time of her INR tomorrow too. ? ? ?

## 2021-09-23 NOTE — Telephone Encounter (Signed)
I see she has CBC order already placed. She will need to go to [facility] for this, we do not have labs here in our office

## 2021-09-23 NOTE — Telephone Encounter (Signed)
Spoke with patient and she would like to do the sacroiliac injection again. She also stated that she did PT and it made her pain worse. She stated that she has the muscle relaxer and only take them when she can't walk ?

## 2021-09-23 NOTE — Telephone Encounter (Signed)
Jane [name], pls contact Jane [name], pls send her to the lab Tues 09/24/2021 to have a repeat CBC.  ? ?Note her Dr. Irven [name] office was planning on doing a repeat INR tomorrow, please see if we can coordinated these labs so she doesn't get stuck twice tomorrow. ? ?Pls obtain a symptoms update, verify if she's had any further dark or bright red blood from the rectum. THX ?

## 2021-09-23 NOTE — Telephone Encounter (Signed)
09/20/21 1347: ?--Caller states she needs to make an appointment. ?States she is having issues with her BP and her ?warfarin. States she will pass out when her BP ?gets low. States she passed out while in dialysis - ?Midodrine - treated and sent home, currently in bed. ? ?09/20/2021 1:56:45 PM See PCP within 24 Hours Blakely, RN, Threasa Beards ? ?Comments ?User: Erik Obey, RN Date/Time Eilene Ghazi Time): 09/20/2021 1:49:25 PM ?Talked with the office yesterday with Social worker. Told needs to be seen by PCP. GI bleed r/t high coumadin. ?Currently being monitored. ? ?User: Erik Obey, RN Date/Time Eilene Ghazi Time): 09/20/2021 1:52:05 PM ?Dr. Mila Palmer office is aware of the fainting at dialysis. ? ?User: Erik Obey, RN Date/Time Eilene Ghazi Time): 09/20/2021 1:57:43 PM ?States will do like she usually does, rest, drink some water and wait this low BP out. Will call office for an appt ?for early next week. ? ?Referrals ?GO TO FACILITY REFUSED ? ?09/20/21 1418: ?Caller states that she has been passing out and is ?also on dialysis 3 times a week. She is on metadine for ?low BP. States her BP is 142/68. states she has a GI ?bleed right now and has been getting iron. HGB 9.7 ?today. Bleeding from bowels that is dark black. States ?she passed out today after dialysis. ? ?09/20/2021 2:29:46 PM Go to ED Now (or PCP triage) Clovis Riley, RN, Georgina Peer ? ?Comments ?User: Arman Bogus, RN Date/Time Eilene Ghazi Time): 09/20/2021 2:32:09 PM ?pt refuses to go to theER or UC. states she wants an appointment for next week. attempted to call back line and no ?answer or voicemail/. ? ?Referrals ?GO TO FACILITY REFUSED ? ? ?

## 2021-09-23 NOTE — Progress Notes (Signed)
Addendum: ?Reviewed and agree with assessment and management plan. ?Warfarin has been held due to recent blood in stool and 1 to 1-1/2 g/dL drop in hemoglobin ?If GI bleeding has stopped and hemoglobin stable likely okay to resume warfarin with close observation for recurrent bleeding ?We will need to monitor hemoglobin closely until planned small bowel enteroscopy and colonoscopy in the outpatient hospital setting; we are working on getting this scheduled ?If recurrent melena or hematochezia/rectal bleeding she should go to the emergency department for probable observation and inpatient endoscopic evaluation ?Candie Gintz, Lajuan Lines, MD ? ?

## 2021-09-23 NOTE — Telephone Encounter (Signed)
[facility] Pre-operative Risk Assessment  ?   ?Jane [name] ?03-21-1946 ?[id number] ? ?Procedure: Enteroscopy and Colonoscopy ?Anesthesia type:  MAC ?Procedure Date: 10/01/21 ?Provider: Dr. Bryan Lemma ? ?Type of Clearance needed: Pharmacy. Would also like to coordinate having a CBC drawn at the time of his INR draw ? ?Medication(s) needing held: Coumadin with possible need for bridging  ? ?Length of time for medication to be held: 5 days ? ?Please review request and advise by either responding to this message or by sending your response to the fax # provided below. ? ?Thank you, ? ?[facility]  ?Phone: [phone] ?Fax: [phone] ?ATTENTION: Fitzhugh Vizcarrondo, LPN ? ?  ?

## 2021-09-23 NOTE — Telephone Encounter (Signed)
Informed patient that she does not have adequate pain relief for a RF. Patient wants to know what the next steps are going to be. Please advise. ?

## 2021-09-23 NOTE — Telephone Encounter (Signed)
This is Dr. Irven Shelling patient. He is not with our group.  ?

## 2021-09-23 NOTE — Telephone Encounter (Signed)
Called Dr. Irven Shelling office to inquire if CBC could be added to lab draw (INR) 09/24/21. States they will forward message to Dr. Irven Shelling nurse. Advised operator of the pt time of appt for tomorrow. States she will send message as urgent. Provided with my direct extension and will await returned call from nurse. ?

## 2021-09-23 NOTE — Telephone Encounter (Signed)
Pt is scheduled for an enteroscopy and colonoscopy at [facility] with Dr. Bryan Lemma on 10/01/21 at 11:30 am. Ambulatory referral to GI placed. Suprep sent to pt's pharmacy. Prep instructions sent to pt's mychart and mailed. Let pt know we would call her with instructions for her coumadin. Pt stated that she is currently not taking blood thinner because her heart specialist took her off of it for now but that she has an appt tomorrow. Let pt know we would check with her doctor and get back to her. Pt verbalized understanding and had no other concerns at end of call.  ?

## 2021-09-23 NOTE — Telephone Encounter (Signed)
Called and spoke with pt. Let pt know that Cardiologist said she could hold her coumadin for 5 days prior to procedure. Pt aware that she needs to hold coumadin starting 09/26/21 for procedure. Updated instructions sent to pt's mychart and mailed.  ?

## 2021-09-24 ENCOUNTER — Ambulatory Visit (INDEPENDENT_AMBULATORY_CARE_PROVIDER_SITE_OTHER): Payer: Medicare Other | Admitting: Family Medicine

## 2021-09-24 ENCOUNTER — Encounter: Payer: Self-pay | Admitting: Cardiology

## 2021-09-24 ENCOUNTER — Telehealth: Payer: Self-pay

## 2021-09-24 ENCOUNTER — Other Ambulatory Visit: Payer: Self-pay

## 2021-09-24 ENCOUNTER — Ambulatory Visit: Payer: Medicare Other | Admitting: Cardiology

## 2021-09-24 ENCOUNTER — Encounter: Payer: Self-pay | Admitting: Family Medicine

## 2021-09-24 VITALS — BP 158/62 | HR 91 | Temp 97.8°F | Ht 65.0 in | Wt 184.4 lb

## 2021-09-24 VITALS — BP 143/60 | HR 70 | Resp 16 | Ht 65.0 in | Wt 184.4 lb

## 2021-09-24 DIAGNOSIS — N186 End stage renal disease: Secondary | ICD-10-CM | POA: Diagnosis not present

## 2021-09-24 DIAGNOSIS — Z992 Dependence on renal dialysis: Secondary | ICD-10-CM

## 2021-09-24 DIAGNOSIS — I700 Atherosclerosis of aorta: Secondary | ICD-10-CM

## 2021-09-24 DIAGNOSIS — I10 Essential (primary) hypertension: Secondary | ICD-10-CM | POA: Diagnosis not present

## 2021-09-24 DIAGNOSIS — R55 Syncope and collapse: Secondary | ICD-10-CM

## 2021-09-24 DIAGNOSIS — K922 Gastrointestinal hemorrhage, unspecified: Secondary | ICD-10-CM

## 2021-09-24 DIAGNOSIS — K219 Gastro-esophageal reflux disease without esophagitis: Secondary | ICD-10-CM | POA: Diagnosis not present

## 2021-09-24 DIAGNOSIS — Z5181 Encounter for therapeutic drug level monitoring: Secondary | ICD-10-CM

## 2021-09-24 DIAGNOSIS — I480 Paroxysmal atrial fibrillation: Secondary | ICD-10-CM | POA: Diagnosis not present

## 2021-09-24 DIAGNOSIS — I6523 Occlusion and stenosis of bilateral carotid arteries: Secondary | ICD-10-CM

## 2021-09-24 DIAGNOSIS — K5900 Constipation, unspecified: Secondary | ICD-10-CM | POA: Diagnosis not present

## 2021-09-24 DIAGNOSIS — Z7901 Long term (current) use of anticoagulants: Secondary | ICD-10-CM | POA: Diagnosis not present

## 2021-09-24 LAB — POCT INR: INR: 1.9 — AB (ref 2.0–3.0)

## 2021-09-24 NOTE — Assessment & Plan Note (Signed)
Linzess 290 mcg helps but it causes diarrhea.  ?OTC medications have not help and tried lower dose Linzess unsuccessfully. ?Continue Linzess 290 mcg q 3rd day. ?Adequate fiber and fluid intake.  ?

## 2021-09-24 NOTE — Telephone Encounter (Signed)
Ok to get it done tomorrow after HD. No need to cancel procedures next week. Will have result back in time. Thanks.  ?

## 2021-09-24 NOTE — Chronic Care Management (AMB) (Signed)
?Chronic Care Management  ? ? Clinical Social Work Note ? ?09/24/2021 ?Name: Jane [name] BELOW MRN: [id number] DOB: May 19, 1946 ? ?Jane Jane [name] is a 76 y.o. year old female who is a primary care patient of [name], [name], MD. The CCM team was consulted to assist the patient with chronic disease management and/or care coordination needs related to: [employer]  and [place] and [employer].  ? ?Engaged with patient by telephone for follow up visit in response to provider referral for social work chronic care management and care coordination services.  ? ?Consent to Services:  ?The patient was given information about Chronic Care Management services, agreed to services, and gave verbal consent prior to initiation of services.  Please Jane [name] initial visit note for detailed documentation.  ? ?Patient agreed to services and consent obtained.  ? ?Assessment: Review of patient past medical history, allergies, medications, and health status, including review of relevant consultants reports was performed today as part of a comprehensive evaluation and provision of chronic care management and care coordination services.    ? ?SDOH (Social Determinants of Health) assessments and interventions performed:   ? ?Advanced Directives Status: Not addressed in this encounter. ? ?CCM Care Plan ? ?Allergies  ?Allergen Reactions  ? Latex Rash  ? Penicillins Other (Jane [name] Comments)  ?  Yeast infection / Childhood  ? Sulfa Antibiotics Rash  ? Tape Other (Jane [name] Comments)  ?  Plastic, silicone, and paper tape causes bruising and pulls off skin. Cloth tape works fine  ? ? ?Outpatient Encounter Medications as of 09/19/2021  ?Medication Sig  ? atorvastatin (LIPITOR) 10 MG tablet Take 1 tablet (10 mg total) by mouth daily.  ? B Complex-C-Zn-Folic Acid (DIALYVITE 614 WITH ZINC) 0.8 MG TABS Take 1 tablet by mouth in the morning.  ? CALCITRIOL PO Take by mouth every Monday, Wednesday, and Friday with hemodialysis.  ? ethyl chloride spray Apply 1  application topically every Monday, Wednesday, and Friday with hemodialysis.  ? linaclotide (LINZESS) 145 MCG CAPS capsule TAKE 1 CAPSULE BY MOUTH ONCE DAILY BEFORE BREAKFAST  ? Methoxy PEG-Epoetin Beta (MIRCERA IJ) Mircera  ? midodrine (PROAMATINE) 10 MG tablet Take 1 tablet (10 mg total) by mouth 3 (three) times daily as needed (low BP and dizziness and lightheadedness symptoms.).  ? pantoprazole (PROTONIX) 40 MG tablet Take 1 tablet (40 mg total) by mouth 2 (two) times daily.  ? sevelamer carbonate (RENVELA) 800 MG tablet Take 800 mg by mouth 3 (three) times daily.  ? warfarin (COUMADIN) 5 MG tablet Take 1 tablet (5 mg total) by mouth daily. Or as directed by coumadin clinic. (Patient taking differently: Take 5-7.5 mg by mouth Jane [name] admin instructions. Take 1 tablet (5 mg) by mouth on Sundays, Mondays, Wednesdays, Fridays & Saturdays in the morning. ?Take 1.5 tablets (7.5 mg) by mouth on Tuesdays & Thursdays in the morning)  ? [DISCONTINUED] cyclobenzaprine (FLEXERIL) 5 MG tablet Take 1 tablet (5 mg total) by mouth 3 (three) times daily as needed.  ? ?Facility-Administered Encounter Medications as of 09/19/2021  ?Medication  ? 0.9 %  sodium chloride infusion  ? sodium chloride flush (NS) 0.9 % injection 3 mL  ? sodium chloride flush (NS) 0.9 % injection 3 mL  ? ? ?Patient Active Problem List  ? Diagnosis Date Noted  ? OSA (obstructive sleep apnea) 07/19/2021  ? Atherosclerosis of aorta ([place]) 05/13/2021  ? Unintentional weight loss 05/08/2021  ? Dysphagia 05/08/2021  ? Chronic left sacroiliac pain 05/07/2021  ? GI  bleed 01/31/2021  ? Gastric intestinal metaplasia 10/18/2020  ? Dyspepsia 10/18/2020  ? Chronic constipation 10/18/2020  ? Nausea without vomiting 05/29/2020  ? Generalized osteoarthritis of multiple sites 03/27/2020  ? Chronic pain disorder 03/27/2020  ? Acute GI bleeding 01/13/2020  ? Duodenal ulcer with hemorrhage   ? Lower GI bleed 01/12/2020  ? Monitoring for long-term anticoagulant use 01/03/2020  ?  Intestinal metaplasia of gastric mucosa 12/04/2019  ? Gastric polyp 12/04/2019  ? Tubulovillous adenoma of small intestine 12/03/2019  ? Adenomatous duodenal polyp 12/03/2019  ? Abnormal endoscopy of upper gastrointestinal tract 12/03/2019  ? Depression, major, recurrent, moderate ([place]) 09/22/2018  ? Orthostatic hypotension 09/16/2018  ? Bilateral lower extremity edema 08/24/2018  ? Abdominal pain, diffuse 05/11/2018  ? Body mass index 40.0-44.9, adult ([place]) 03/18/2018  ? Unilateral primary osteoarthritis, right knee 03/18/2018  ? Chronic pain of right knee 03/18/2018  ? Abnormal facial hair 11/10/2017  ? Chronic anticoagulation 11/10/2017  ? GERD (gastroesophageal reflux disease) 06/16/2017  ? Constipation 10/16/2016  ? Morbid obesity ([place]) 10/16/2016  ? Diverticulitis of large intestine with perforation 06/26/2016  ? Anemia in other chronic diseases classified elsewhere 06/25/2016  ? Chest pain with moderate risk for cardiac etiology 06/25/2016  ? ESRD on hemodialysis ([place]) 06/30/2012  ? Paroxysmal atrial fibrillation ([place]) 06/30/2012  ? Hypertension with renal disease 06/30/2012  ? ? ?Conditions to be addressed/monitored: HTN, ESRD, Depression, and Chronic Pain ; Financial constraints related to obtaining oxygen ? ?Care Plan : Jane Jane [name] Plan of Care  ?Updates made by Rebekah Chesterfield, Jane Jane [name] since 09/24/2021 12:00 AM  ?  ? ?Problem: Coping Skills (General Plan of Care)   ?  ? ?Goal: Coping Skills Enhanced   ?Start Date: 08/29/2021  ?This Visit's Progress: On track  ?Recent Progress: On track  ?Priority: High  ?Note:   ?Current barriers:   ? Financial constraints related to affording oxygen ?Clinical Goals: Patient will work with CCM Jane Jane [name] to address needs related to management of health conditions ?Clinical Interventions:  ?Assessment of needs, barriers , agencies contacted, as well as how impacting care 3/2: Patient reports she has been feeling very sick towards the end of her dialysis tx and her blood pressure been low. As  a result, she reports decreased mobility. Pt agreed to discuss concerns with providers ?Patient completed a sleep study and was informed oxygen levels are low. States she needs oxygen (2 liters) when she sleeps, which will be approx $16 a month; however, pt is experiencing hardship financially [name] she declined services 3/2: CCM Jane Jane [name] contacted [place] with patient. Jane Jane [name] inquired if patient was able to pay for annual membership with a check, representative accepts debit/credit card only. Jane Jane [name] will collaborate with [facility] staff regarding payment options ?[employer] will only will assist if she was a member but membership wasn't beneficial. It will be $16 yearly for membership and Jeanine found resources. CCM Jane Jane [name] will collaborate with care guide ?Pt receives support from Jane Jane [name] at dialysis regarding transportation resources. Pt's SNAP benefits were terminated after a small increase in disability. Pt is experiencing difficulty with food restrictions due to chronic health conditions. Jane Jane [name] discussed supportive resources ?Patient was successful in identifying healthy strategies to cope with stress (praying, walking) She receives support from daughter and son and law resides local ?Mindfulness or Relaxation training provided ?Active listening / Reflection utilized  ?Emotional Support Provided ?Problem Solving /Task Center strategies reviewed ?Verbalization of feelings encouraged  ?Review various resources, discussed options and provided patient information about  ?Community food options  ?  1:1 collaboration with primary care provider regarding development and update of comprehensive plan of care as evidenced by provider attestation and co-signature ?Inter-disciplinary care team collaboration (Jane [name] longitudinal plan of care) ?Patient Goals/Self-Care Activities: Over the next 120 days ?Attend all scheduled medical appointments ?Utilize healthy coping skills and/or supportive resources discussed ?Contact PCP office with any questions or  concerns ? ?  ?  ? ?Jane Jane [name], MSW, Jane Jane [name] ?[place] Management ?[facility] [employer] ?[name] ?Phone [phone] ?5:21 AM ? ? ? ?

## 2021-09-24 NOTE — Assessment & Plan Note (Addendum)
Reporting some melena and red bright blood per rectum with defecation. ?Problem has not improved after stopping Coumadin. ?Instructed about warning signs. ?Pending colonoscopy. ?CBC will be repeated today and faxed to her GI. ?

## 2021-09-24 NOTE — Progress Notes (Signed)
Primary Physician/Referring:  [name], [name], MD  [name], female    DOB: 04-05-1946, 76 y.o.   MRN: [id number]  Chief Complaint  Patient presents with   Pre-op Exam    Referral for Watchman Procedure   Atrial Fibrillation    Follow up   HPI:    Jane [name]  is a 76 y.o. female with paroxysmal atrial fibrillation, on low-dose amiodarone, dose reduced due to prolonged QT and eventually discontinued, end-stage disease on hemodialysis on Monday Wednesday and Friday, orthostatic hypotension, obstructive sleep apnea not on CPAP. Patient has history of GERD, gastritis in the past, gastric polyps and also has had multiple colonic polyps and GI bleed from splenic flexure AVM and also diverticular disease status post partial colectomy in 2019.  Last GI bleed was in July 2022 from AVM in the splenic flexure.  Patient is having recurrence of GI bleed for the past 2 weeks, she has been now scheduled for further GI work-up and I was asked to see the patient regarding management of anticoagulation.   Otherwise remains asymptomatic without chest pain, dyspnea or palpitations.  Past Medical History:  Diagnosis Date   Acid reflux    Anemia of chronic disease    Arthritis    Asthma    Atrial fibrillation ([place])    Bilateral carotid bruits    Complication of anesthesia    "hard to wake up, I have sleep apnea" no CPAP   Diverticulitis    Duodenal ulcer    Dysrhythmia    Afib   ESRD (end stage renal disease) ([place])    MWF [place]   Headache    Heart murmur    History of blood transfusion    Hypertension    Malaise and fatigue    Orthostatic hypotension    Shortness of breath    " when I walk to fast"   Sleep apnea    Syncope    Tubulovillous adenoma of colon    Social History   Tobacco Use   Smoking status: Never    Passive exposure: Never   Smokeless tobacco: Never  Substance Use Topics   Alcohol use: No   ROS  Review of Systems  HENT:  Ear discharge:  anemia due to chronic blood loss.   Cardiovascular:  Negative for chest pain, dyspnea on exertion, leg swelling, palpitations and syncope.  Gastrointestinal:  Positive for melena. Negative for constipation and hemorrhoids.  Objective  Blood pressure (!) 158/62, pulse 91, temperature 97.8 F (36.6 C), temperature source Temporal, height '5\' 5"'$  (1.651 m), weight 184 lb 6.4 oz (83.6 kg), SpO2 96 %.   Vitals with BMI 09/24/2021 09/19/2021 09/17/2021  Height '5\' 5"'$  '5\' 4"'$  '5\' 4"'$   Weight 184 lbs 6 oz 184 lbs 2 oz 184 lbs  BMI 30.69 27.06 23.76  Systolic 283 151 761  Diastolic 62 68 75  Pulse 91 79 84   Orthostatic VS for the past 72 hrs (Last 3 readings):  Patient Position BP Location Cuff Size  09/24/21 1045 Sitting Left Arm Large      Physical Exam Constitutional:      Comments: Moderately obese  Neck:     Vascular: Carotid bruit (Bilateral) present. No JVD.  Cardiovascular:     Rate and Rhythm: Regular rhythm. Bradycardia present.     Pulses: Intact distal pulses.     Heart sounds: Murmur heard.  Early systolic murmur is present with a grade of 2/6 at the upper  right sternal border.    No gallop.     Comments: Right arm AV fistula noted and functioning. Pulmonary:     Effort: Pulmonary effort is normal.     Breath sounds: Normal breath sounds.  Abdominal:     General: Bowel sounds are normal.     Palpations: Abdomen is soft.  Musculoskeletal:     Right lower leg: No edema.     Left lower leg: No edema.   Laboratory examination:   Recent Labs    01/31/21 1537 02/01/21 0210 04/29/21 1612 09/05/21 0726 09/19/21 1430  NA 138 136 137 136 131*  K 4.3 4.4 3.3* 3.9 5.5 No hemolysis seen*  CL 100 99 101 97* 92*  CO2 '30 30 26  '$ --  26  GLUCOSE 98 85 110* 86 95  BUN 25* 29* 36* 45* 83*  CREATININE 5.99* 6.79* 9.77* 7.80* 11.31*  CALCIUM 9.0 8.9 9.8  --  10.4  GFRNONAA 7* 6* 4*  --   --     estimated creatinine clearance is 4.4 mL/min (A) (by C-G formula based on SCr of 11.31  mg/dL [facility])).  CMP Latest Ref Rng & Units 09/19/2021 09/05/2021 04/29/2021  Glucose 70 - 99 mg/dL 95 86 110(H)  BUN 6 - 23 mg/dL 83(H) 45(H) 36(H)  Creatinine 0.40 - 1.20 mg/dL 11.31(HH) 7.80(H) 9.77(H)  Sodium 135 - 145 mEq/L 131(L) 136 137  Potassium 3.5 - 5.1 mEq/L 5.5 No hemolysis seen(H) 3.9 3.3(L)  Chloride 96 - 112 mEq/L 92(L) 97(L) 101  CO2 19 - 32 mEq/L 26 - 26  Calcium 8.4 - 10.5 mg/dL 10.4 - 9.8  Total Protein 6.0 - 8.3 g/dL 8.3 - -  Total Bilirubin 0.2 - 1.2 mg/dL 0.6 - -  Alkaline Phos 39 - 117 U/L 115 - -  AST 0 - 37 U/L 17 - -  ALT 0 - 35 U/L 14 - -   CBC Latest Ref Rng & Units 09/19/2021 09/05/2021 04/29/2021  WBC 4.0 - 10.5 K/uL 4.8 - 5.4  Hemoglobin 12.0 - 15.0 g/dL 9.6(L) 14.3 11.8(L)  Hematocrit 36.0 - 46.0 % 29.7(L) 42.0 39.4  Platelets 150.0 - 400.0 K/uL 174.0 - 213   Lipid Panel     Component Value Date/Time   CHOL 198 10/06/2019 1510   TRIG 51 10/06/2019 1510   HDL 53 10/06/2019 1510   CHOLHDL 3.7 10/06/2019 1510   VLDL 10 10/06/2019 1510   LDLCALC 135 (H) 10/06/2019 1510   TSH Recent Labs    11/20/20 1025  TSH 1.36    External labs:  Labs 02/23/2021:  151, creatinine 8.48, potassium 3.7, sodium 142.  Hb 9.0/HCT 30.6, normal indicis.  Medications and allergies   Allergies  Allergen Reactions   Latex Rash   Penicillins Other (See Comments)    Yeast infection / Childhood   Sulfa Antibiotics Rash   Tape Other (See Comments)    Plastic, silicone, and paper tape causes bruising and pulls off skin. Cloth tape works fine     Current Outpatient Medications:    atorvastatin (LIPITOR) 10 MG tablet, Take 1 tablet (10 mg total) by mouth daily., Disp: 30 tablet, Rfl: 3   B Complex-C-Zn-Folic Acid (DIALYVITE 563 WITH ZINC) 0.8 MG TABS, Take 1 tablet by mouth in the morning., Disp: , Rfl:    CALCITRIOL PO, Take by mouth every Monday, Wednesday, and Friday with hemodialysis., Disp: , Rfl:    ethyl chloride spray, Apply 1 application topically every Monday,  Wednesday, and Friday with  hemodialysis., Disp: , Rfl: 12   linaclotide (LINZESS) 145 MCG CAPS capsule, TAKE 1 CAPSULE BY MOUTH ONCE DAILY BEFORE BREAKFAST, Disp: 90 capsule, Rfl: 2   Methoxy PEG-Epoetin Beta (MIRCERA IJ), Mircera, Disp: , Rfl:    midodrine (PROAMATINE) 10 MG tablet, Take 1 tablet (10 mg total) by mouth 3 (three) times daily as needed (low BP and dizziness and lightheadedness symptoms.). (Patient taking differently: Take 10 mg by mouth 3 (three) times daily as needed (low BP and dizziness and lightheadedness symptoms. SBP <130 mmHg).), Disp: 90 tablet, Rfl: 3   pantoprazole (PROTONIX) 40 MG tablet, Take 1 tablet (40 mg total) by mouth 2 (two) times daily., Disp: 180 tablet, Rfl: 2   sevelamer carbonate (RENVELA) 800 MG tablet, Take 800 mg by mouth 3 (three) times daily., Disp: , Rfl:    warfarin (COUMADIN) 5 MG tablet, Take 1 tablet (5 mg total) by mouth daily. Or as directed by coumadin clinic. (Patient taking differently: Take 5-7.5 mg by mouth See admin instructions. Take 1 tablet (5 mg) by mouth on Sundays, Mondays, Wednesdays, Fridays & Saturdays in the morning. Take 1.5 tablets (7.5 mg) by mouth on Tuesdays & Thursdays in the morning), Disp: 100 tablet, Rfl: 3   Radiology:   No results found.  Cardiac Studies:    Lexiscan myoview stress test 09/06/2018:  1. Lexiscan stress test was performed. Exercise capacity was not assessed. Stress symptoms included dyspnea, dizziness. Peak effect blood pressure was 158/68 mmHg. The resting and stress electrocardiogram demonstrated normal sinus rhythm, normal resting conduction, no resting arrhythmias and normal rest repolarization.  Stress EKG is non diagnostic for ischemia as it is a pharmacologic stress.  2. The overall quality of the study is good. There is no evidence of abnormal lung activity. Stress and rest SPECT images demonstrate homogeneous tracer distribution throughout the myocardium. Gated SPECT imaging reveals normal  myocardial thickening and wall motion. The left ventricular ejection fraction was normal (61%).   3. Low risk study.  Carotid artery duplex  04/24/2020: Stenosis in the right external carotid artery (<50%). Minimal stenosis in the left internal carotid artery (minimal). Stenosis in the left common carotid artery (<50%). There is mild diffuse heterogeneous plaque in bilateral carotid arteries. Antegrade right vertebral artery flow.  Follow up in one year is appropriate if clinically indicated. No significant change from 12/02/2016.  Echocardiogram 12/27/2020: Normal LV systolic function with visual EF 60-65%. Left ventricle cavity is normal in size. Moderate left ventricular hypertrophy. Normal global wallmotion. Indeterminate diastolic filling pattern, elevated LAP. No significant valvular heart disease. Compared to study dated 11/24/2017: LVEF is preserved, G2DD is now indeterminate with elevated LAP, MR and TR have improved, otherwise nosignificant change.  EKG  EKG 11/16/2020: Sinus bradycardia at rate of 56 bpm, left axis deviation, left anterior fascicular block.  Anteroseptal infarct old.  Normal QT interval.  No significant change from 01/10/2021, previously heart rate was 76 bpm.  EKG 11/26/2020: Atypical atrial flutter with RVR at rate of 136 bpm, left axis deviation, left anterior fascicular block.  No evidence of ischemia.  Assessment     ICD-10-CM   1. Paroxysmal atrial fibrillation (HCC)  I48.0     2. Monitoring for long-term anticoagulant use  Z51.81    Z79.01       CHA2DS2-VASc Score is 4.  Yearly risk of stroke: 4.8% (HTN, female, age 79).  Score of 1=0.6; 2=2.2; 3=3.2; 4=4.8; 5=7.2; 6=9.8; 7=>9.8) -(CHF; HTN; vasc disease DM,  Female = 1; Age <65 =0;  65-74 = 1,  >75 =2; stroke/embolism= 2).   No orders of the defined types were placed in this encounter.  Medications Discontinued During This Encounter  Medication Reason   sodium chloride flush (NS) 0.9 % injection  3 mL    sodium chloride flush (NS) 0.9 % injection 3 mL    0.9 %  sodium chloride infusion    Recommendations:   Jane [name]  is a 76 y.o. emale with paroxysmal atrial fibrillation, on low-dose amiodarone, dose reduced due to prolonged QT and eventually discontinued, end-stage disease on hemodialysis on Monday Wednesday and Friday, orthostatic hypotension, obstructive sleep apnea not on CPAP. Patient has history of GERD, gastritis in the past, gastric polyps and also has had multiple colonic polyps and GI bleed from splenic flexure AVM and also diverticular disease status post partial colectomy in 2019.  Last GI bleed was in July 2022 from AVM in the splenic flexure.  Patient is having recurrence of GI bleed for the past 2 weeks, she has been now scheduled for further GI work-up and I was asked to see the patient regarding management of anticoagulation.   Patient has high CHA2DS2-VASc risk score.  However there is no option but to discontinue anticoagulation for now in view of active GI bleed and continued anemia.  Her INR was subtherapeutic today but still in spite of this, at 1.9, she is still having occasional episodes of bright red blood in the stool and some melena.  I have discussed with her regarding proceeding with GI work-up and we could consider her and evaluate her for Watchman device.  [facility] Referral for Left Atrial Appendage Closure with Non-Valvular Atrial Fibrillation   Jane [name] is a 76 y.o. female is being referred to the [facility] Team for evaluation for Left Atrial Appendage Closure with Watchman device for the management of stroke risk resulting form non-valvular atrial fibrillation.    Base upon Jane [name] history, she is felt to be a poor candidate for long-term anticoagulation because of a history of bleeding (e.g. intracerebral, subdural, GI, retro-peritoneal) and end stage renal disease on hemodialysis .  The patient has a HAS-BLED  score of 4 indicating a Yearly Major Bleeding Risk of 8.7%.       Her CHADS2-VASc Score is 4 with an unadjusted Ischemic Stroke Rate (% per year) of 4.8%.     Her stroke risk necessitates a strategy of stroke prevention with either long-term oral anticoagulation or left atrial appendage occlusion therapy. We have discussed their bleeding risk in the context of their comorbid medical problems, as well as the rationale for referral for evaluation of Watchman left atrial appendage occlusion therapy. While the patient is at high long-term bleeding risk, they may be appropriate for short-term anticoagulation. Based on this individual patient's stroke and bleeding risk, a shared decision has been made to refer the patient for consideration of Watchman left atrial appendage closure utilizing the [employer] of Cardiology shared decision tool.  Depending upon her work-up, I will see her back in a continuous fashion, for now she has an appointment to see me in a year but has follow-up appointment in our Coumadin clinic and I will continue to monitor closely.   Adrian Prows, MD, [facility] 09/24/2021, 11:04 AM Office: [phone]

## 2021-09-24 NOTE — Telephone Encounter (Signed)
Request was addressed in a separate encounter with Dr. Irven Shelling office re: request to add on CBC. Per Dr. Einar Gip, pt will need to have CBC obtained at an alternate draw site. Spoke with pt who responded that she will be getting her CBC completed AFTER she has left Dr. Irven Shelling office. At the time of my call, pt was @ Dr. Irven Shelling for her appt re: chest discomfort and routine lab draw. ? ?While on the phone, pt did state she is still having BRRB but only upon defecation. States she needs to take Linzess in order to defecate. Stools are firm and without need to strain to defecate, followed by loose stool. Denies having any abd pain or abd firmness. Pt states she needs to rely on Linzess in order to defecate, otherwise can go days without having a BM. Advised I will be forwarding this update to Carl Best, NP. She will likely want to review Dr. Irven Shelling office notes and await results of CBC before providing additional recommendations. Advised she will receive a call with further advice once all data has been reviewed. Expressed appreciation and understanding. ?

## 2021-09-24 NOTE — Telephone Encounter (Signed)
Spoke with pt re: Dr. Irven Shelling response. States she is completing her CBC today. ?

## 2021-09-24 NOTE — Assessment & Plan Note (Signed)
Seen on imaging in 09/2020. ?She is on Atorvastatin. ?

## 2021-09-24 NOTE — Patient Instructions (Signed)
INR below goal. Continue holding warfarin till 10/01/21. Restart warfarin 5 mg daily after scheduled colonoscopy. Recheck INR in 1 week post-op.  ?

## 2021-09-24 NOTE — Assessment & Plan Note (Signed)
She saw cardiologist today. ?Rhythm controlled today. ?Because GI bleeding,she is not longer on Coumadin. ?Pending consultation to discussed Watchman procedure. ?

## 2021-09-24 NOTE — Assessment & Plan Note (Signed)
For the past few months she has not tolerated dialysis well, developing hypotension and syncopal episodes. ?Dry wt has been adjusted, so hopefully this will help. ?She is having difficulty meeting protein intake requirements, food intake aggravate abdominal cramps and nausea. Recommend trying dairy free Ensure, small bites of protein containing foods through the day, small bites at the time. ?

## 2021-09-24 NOTE — Telephone Encounter (Signed)
Spoke with pt to let her know she can come by our office after dialysis tomorrow to get CBC drawn and we do not need to cancel procedure. Pt verbalized understanding.  ?

## 2021-09-24 NOTE — Patient Instructions (Signed)
A few things to remember from today's visit: ? ? ?Gastroesophageal reflux disease without esophagitis ? ?Syncope, unspecified syncope type - Plan: For home use only DME Other see comment ? ?ESRD on hemodialysis ([place]) - Plan: For home use only DME Other see comment ? ?Constipation, unspecified constipation type ? ?Rectal bleeding ? ?Paroxysmal atrial fibrillation (HCC) ? ?If you need refills please call your pharmacy. ?Do not use My Chart to request refills or for acute issues that need immediate attention. ?  ?Hopefully you feel better after dry wt is changed. ?Today lab done to re-check anemia. ?Move slowly. ?Continue Linzess every 3rd day. ? ?Please be sure medication list is accurate. ?If a new problem present, please set up appointment sooner than planned today. ? ?

## 2021-09-24 NOTE — Patient Instructions (Signed)
Visit Information ? ?Thank you for taking time to visit with me today. Please don't hesitate to contact me if I can be of assistance to you before our next scheduled telephone appointment. ? ?Following are the goals we discussed today:  ?Patient Goals/Self-Care Activities: Over the next 120 days ?Attend all scheduled medical appointments ?Utilize healthy coping skills and/or supportive resources discussed ?Contact PCP office with any questions or concerns ? ?Our next appointment is by telephone on 10/03/21 at 9:00 AM ? ?Please call the care guide team at [phone] if you need to cancel or reschedule your appointment.  ? ?If you are experiencing a Mental Health or [place] or need someone to talk to, please call the Suicide and Crisis Lifeline: 988 ?call 911  ? ?Patient verbalizes understanding of instructions and care plan provided today and agrees to view in [place]. Active MyChart status confirmed with patient.   ? ?Christa See, MSW, LCSW ?[place] [employer] ?[facility] [place] [facility] ?[email or web address] ?Phone [phone] ?5:22 AM ?  ?

## 2021-09-24 NOTE — Progress Notes (Signed)
[place] ?Referral for Left Atrial Appendage Closure with Non-Valvular Atrial Fibrillation  ? ?Jane [name] is a 76 y.o. female is being referred to the [facility] Team for evaluation for Left Atrial Appendage Closure with Watchman device for the management of stroke risk resulting form non-valvular atrial fibrillation.   ? ?Base upon Jane [name] history, she is felt to be a poor candidate for long-term anticoagulation because of a history of bleeding (e.g. intracerebral, subdural, GI, retro-peritoneal).  The patient has a HAS-BLED score of 4 indicating a Yearly Major Bleeding Risk of 8.7%.     ? ?Her CHADS2-VASc Score is 4 with an unadjusted Ischemic Stroke Rate (% per year) of 4.8%.   ? ?Her stroke risk necessitates a strategy of stroke prevention with either long-term oral anticoagulation or left atrial appendage occlusion therapy. We have discussed their bleeding risk in the context of their comorbid medical problems, as well as the rationale for referral for evaluation of Watchman left atrial appendage occlusion therapy. While the patient is at high long-term bleeding risk, they may be appropriate for short-term anticoagulation. Based on this individual patient's stroke and bleeding risk, a shared decision has been made to refer the patient for consideration of Watchman left atrial appendage closure utilizing the [employer] of Cardiology shared decision tool. ? ?

## 2021-09-24 NOTE — Assessment & Plan Note (Signed)
Chest discomfort, nausea, and vomiting can be caused by this problem. ?She is already on Protonix 40 mg twice daily. ?Continue GERD precautions. ?Following with GI. ?

## 2021-09-24 NOTE — Telephone Encounter (Signed)
Pt called to cancel procedure at [facility] because she states she has no transportation to get CBC done today. Pt states she would be able to get lab done tomorrow after dialysis. Pt stated she needs to cancel because she thought it had to be done today. Can CBC be postponed until tomorrow?  ?

## 2021-09-25 ENCOUNTER — Other Ambulatory Visit (INDEPENDENT_AMBULATORY_CARE_PROVIDER_SITE_OTHER): Payer: Medicare Other

## 2021-09-25 ENCOUNTER — Encounter (HOSPITAL_COMMUNITY): Payer: Self-pay | Admitting: Emergency Medicine

## 2021-09-25 ENCOUNTER — Emergency Department (HOSPITAL_COMMUNITY): Payer: Medicare Other

## 2021-09-25 ENCOUNTER — Inpatient Hospital Stay (HOSPITAL_COMMUNITY)
Admission: EM | Admit: 2021-09-25 | Discharge: 2021-10-02 | DRG: 377 | Disposition: A | Discharge status: Home / Self Care | Payer: Medicare Other | Attending: Internal Medicine | Admitting: Internal Medicine

## 2021-09-25 DIAGNOSIS — Z91048 Other nonmedicinal substance allergy status: Secondary | ICD-10-CM

## 2021-09-25 DIAGNOSIS — I10 Essential (primary) hypertension: Secondary | ICD-10-CM | POA: Diagnosis not present

## 2021-09-25 DIAGNOSIS — K921 Melena: Secondary | ICD-10-CM | POA: Diagnosis not present

## 2021-09-25 DIAGNOSIS — Z882 Allergy status to sulfonamides status: Secondary | ICD-10-CM

## 2021-09-25 DIAGNOSIS — T45515A Adverse effect of anticoagulants, initial encounter: Secondary | ICD-10-CM | POA: Diagnosis present

## 2021-09-25 DIAGNOSIS — I4891 Unspecified atrial fibrillation: Secondary | ICD-10-CM | POA: Diagnosis not present

## 2021-09-25 DIAGNOSIS — D123 Benign neoplasm of transverse colon: Secondary | ICD-10-CM | POA: Diagnosis not present

## 2021-09-25 DIAGNOSIS — Z7901 Long term (current) use of anticoagulants: Secondary | ICD-10-CM | POA: Diagnosis not present

## 2021-09-25 DIAGNOSIS — I120 Hypertensive chronic kidney disease with stage 5 chronic kidney disease or end stage renal disease: Secondary | ICD-10-CM | POA: Diagnosis present

## 2021-09-25 DIAGNOSIS — G4733 Obstructive sleep apnea (adult) (pediatric): Secondary | ICD-10-CM | POA: Diagnosis present

## 2021-09-25 DIAGNOSIS — K648 Other hemorrhoids: Secondary | ICD-10-CM | POA: Diagnosis not present

## 2021-09-25 DIAGNOSIS — Z86010 Personal history of colonic polyps: Secondary | ICD-10-CM

## 2021-09-25 DIAGNOSIS — K2970 Gastritis, unspecified, without bleeding: Secondary | ICD-10-CM

## 2021-09-25 DIAGNOSIS — K5909 Other constipation: Secondary | ICD-10-CM | POA: Diagnosis present

## 2021-09-25 DIAGNOSIS — Z981 Arthrodesis status: Secondary | ICD-10-CM

## 2021-09-25 DIAGNOSIS — K644 Residual hemorrhoidal skin tags: Secondary | ICD-10-CM | POA: Diagnosis present

## 2021-09-25 DIAGNOSIS — D649 Anemia, unspecified: Secondary | ICD-10-CM | POA: Diagnosis not present

## 2021-09-25 DIAGNOSIS — J45909 Unspecified asthma, uncomplicated: Secondary | ICD-10-CM | POA: Diagnosis present

## 2021-09-25 DIAGNOSIS — N2581 Secondary hyperparathyroidism of renal origin: Secondary | ICD-10-CM | POA: Diagnosis present

## 2021-09-25 DIAGNOSIS — M1990 Unspecified osteoarthritis, unspecified site: Secondary | ICD-10-CM | POA: Diagnosis present

## 2021-09-25 DIAGNOSIS — Z6830 Body mass index (BMI) 30.0-30.9, adult: Secondary | ICD-10-CM

## 2021-09-25 DIAGNOSIS — D6832 Hemorrhagic disorder due to extrinsic circulating anticoagulants: Secondary | ICD-10-CM | POA: Diagnosis present

## 2021-09-25 DIAGNOSIS — K219 Gastro-esophageal reflux disease without esophagitis: Secondary | ICD-10-CM | POA: Diagnosis not present

## 2021-09-25 DIAGNOSIS — K3189 Other diseases of stomach and duodenum: Secondary | ICD-10-CM | POA: Diagnosis not present

## 2021-09-25 DIAGNOSIS — K922 Gastrointestinal hemorrhage, unspecified: Secondary | ICD-10-CM | POA: Diagnosis not present

## 2021-09-25 DIAGNOSIS — G4730 Sleep apnea, unspecified: Secondary | ICD-10-CM | POA: Diagnosis not present

## 2021-09-25 DIAGNOSIS — E663 Overweight: Secondary | ICD-10-CM | POA: Diagnosis not present

## 2021-09-25 DIAGNOSIS — Z992 Dependence on renal dialysis: Secondary | ICD-10-CM

## 2021-09-25 DIAGNOSIS — Z20822 Contact with and (suspected) exposure to covid-19: Secondary | ICD-10-CM | POA: Diagnosis present

## 2021-09-25 DIAGNOSIS — D62 Acute posthemorrhagic anemia: Secondary | ICD-10-CM | POA: Diagnosis present

## 2021-09-25 DIAGNOSIS — K2990 Gastroduodenitis, unspecified, without bleeding: Secondary | ICD-10-CM | POA: Diagnosis present

## 2021-09-25 DIAGNOSIS — Z8249 Family history of ischemic heart disease and other diseases of the circulatory system: Secondary | ICD-10-CM

## 2021-09-25 DIAGNOSIS — Z9049 Acquired absence of other specified parts of digestive tract: Secondary | ICD-10-CM

## 2021-09-25 DIAGNOSIS — K635 Polyp of colon: Secondary | ICD-10-CM

## 2021-09-25 DIAGNOSIS — N186 End stage renal disease: Secondary | ICD-10-CM

## 2021-09-25 DIAGNOSIS — I700 Atherosclerosis of aorta: Secondary | ICD-10-CM | POA: Diagnosis not present

## 2021-09-25 DIAGNOSIS — N250 Renal osteodystrophy: Secondary | ICD-10-CM | POA: Diagnosis not present

## 2021-09-25 DIAGNOSIS — K269 Duodenal ulcer, unspecified as acute or chronic, without hemorrhage or perforation: Secondary | ICD-10-CM | POA: Diagnosis not present

## 2021-09-25 DIAGNOSIS — R0602 Shortness of breath: Secondary | ICD-10-CM | POA: Diagnosis not present

## 2021-09-25 DIAGNOSIS — K317 Polyp of stomach and duodenum: Secondary | ICD-10-CM | POA: Diagnosis present

## 2021-09-25 DIAGNOSIS — I959 Hypotension, unspecified: Secondary | ICD-10-CM | POA: Diagnosis present

## 2021-09-25 DIAGNOSIS — I480 Paroxysmal atrial fibrillation: Secondary | ICD-10-CM | POA: Diagnosis not present

## 2021-09-25 DIAGNOSIS — I774 Celiac artery compression syndrome: Secondary | ICD-10-CM | POA: Diagnosis not present

## 2021-09-25 DIAGNOSIS — K625 Hemorrhage of anus and rectum: Secondary | ICD-10-CM | POA: Diagnosis not present

## 2021-09-25 DIAGNOSIS — K5521 Angiodysplasia of colon with hemorrhage: Principal | ICD-10-CM | POA: Diagnosis present

## 2021-09-25 DIAGNOSIS — N289 Disorder of kidney and ureter, unspecified: Secondary | ICD-10-CM | POA: Diagnosis not present

## 2021-09-25 DIAGNOSIS — Z823 Family history of stroke: Secondary | ICD-10-CM

## 2021-09-25 DIAGNOSIS — K2971 Gastritis, unspecified, with bleeding: Secondary | ICD-10-CM | POA: Diagnosis not present

## 2021-09-25 DIAGNOSIS — Z79899 Other long term (current) drug therapy: Secondary | ICD-10-CM

## 2021-09-25 DIAGNOSIS — D631 Anemia in chronic kidney disease: Secondary | ICD-10-CM | POA: Diagnosis not present

## 2021-09-25 DIAGNOSIS — Z8719 Personal history of other diseases of the digestive system: Secondary | ICD-10-CM

## 2021-09-25 DIAGNOSIS — Z96652 Presence of left artificial knee joint: Secondary | ICD-10-CM | POA: Diagnosis present

## 2021-09-25 DIAGNOSIS — Z8711 Personal history of peptic ulcer disease: Secondary | ICD-10-CM

## 2021-09-25 DIAGNOSIS — Z91040 Latex allergy status: Secondary | ICD-10-CM

## 2021-09-25 DIAGNOSIS — K6389 Other specified diseases of intestine: Secondary | ICD-10-CM | POA: Diagnosis not present

## 2021-09-25 DIAGNOSIS — Z880 Allergy status to penicillin: Secondary | ICD-10-CM

## 2021-09-25 LAB — COMPREHENSIVE METABOLIC PANEL
ALT: 14 U/L (ref 0–44)
AST: 20 U/L (ref 15–41)
Albumin: 3.6 g/dL (ref 3.5–5.0)
Alkaline Phosphatase: 108 U/L (ref 38–126)
Anion gap: 10 (ref 5–15)
BUN: 17 mg/dL (ref 8–23)
CO2: 28 mmol/L (ref 22–32)
Calcium: 9.9 mg/dL (ref 8.9–10.3)
Chloride: 100 mmol/L (ref 98–111)
Creatinine, Ser: 5.42 mg/dL — ABNORMAL HIGH (ref 0.44–1.00)
GFR, Estimated: 8 mL/min — ABNORMAL LOW (ref >60)
Glucose, Bld: 117 mg/dL — ABNORMAL HIGH (ref 70–99)
Potassium: 3.8 mmol/L (ref 3.5–5.1)
Sodium: 138 mmol/L (ref 135–145)
Total Bilirubin: 0.4 mg/dL (ref 0.3–1.2)
Total Protein: 7.5 g/dL (ref 6.5–8.1)

## 2021-09-25 LAB — RESP PANEL BY RT-PCR (FLU A&B, COVID) ARPGX2
Influenza A by PCR: NEGATIVE
Influenza B by PCR: NEGATIVE
SARS Coronavirus 2 by RT PCR: NEGATIVE

## 2021-09-25 LAB — CBC
HCT: 23.6 % — CL (ref 36.0–46.0)
HCT: 24.5 % — ABNORMAL LOW (ref 36.0–46.0)
Hemoglobin: 7.7 g/dL — CL (ref 12.0–15.0)
Hemoglobin: 7.6 g/dL — ABNORMAL LOW (ref 12.0–15.0)
MCH: 30.8 pg (ref 26.0–34.0)
MCHC: 32.8 g/dL (ref 30.0–36.0)
MCHC: 31 g/dL (ref 30.0–36.0)
MCV: 92.6 fl (ref 78.0–100.0)
MCV: 99.2 fL (ref 80.0–100.0)
Platelets: 201 10*3/uL (ref 150.0–400.0)
Platelets: 204 10*3/uL (ref 150–400)
RBC: 2.55 Mil/uL — ABNORMAL LOW (ref 3.87–5.11)
RBC: 2.47 MIL/uL — ABNORMAL LOW (ref 3.87–5.11)
RDW: 14.7 % (ref 11.5–15.5)
RDW: 14.9 % (ref 11.5–15.5)
WBC: 6.1 10*3/uL (ref 4.0–10.5)
WBC: 5.8 10*3/uL (ref 4.0–10.5)
nRBC: 0 % (ref 0.0–0.2)

## 2021-09-25 LAB — PROTIME-INR
INR: 1.2 (ref 0.8–1.2)
Prothrombin Time: 14.8 seconds (ref 11.4–15.2)

## 2021-09-25 LAB — HEMOGLOBIN AND HEMATOCRIT, BLOOD
HCT: 28.7 % — ABNORMAL LOW (ref 36.0–46.0)
Hemoglobin: 8.7 g/dL — ABNORMAL LOW (ref 12.0–15.0)

## 2021-09-25 LAB — PREPARE RBC (CROSSMATCH)

## 2021-09-25 MED ORDER — SODIUM CHLORIDE 0.9 % IV SOLN
10.0000 mL/h | Freq: Once | INTRAVENOUS | Status: AC
  Administered 2021-09-25: 10 mL/h via INTRAVENOUS

## 2021-09-25 MED ORDER — PEG-KCL-NACL-NASULF-NA ASC-C 100 G PO SOLR
0.5000 | Freq: Once | ORAL | Status: AC
  Administered 2021-09-25: 100 g via ORAL
  Filled 2021-09-25: qty 1

## 2021-09-25 MED ORDER — BISACODYL 5 MG PO TBEC
20.0000 mg | DELAYED_RELEASE_TABLET | Freq: Once | ORAL | Status: AC
  Administered 2021-09-25: 20 mg via ORAL
  Filled 2021-09-25: qty 4

## 2021-09-25 MED ORDER — METOCLOPRAMIDE HCL 5 MG/ML IJ SOLN
10.0000 mg | Freq: Once | INTRAMUSCULAR | Status: AC
  Administered 2021-09-25: 10 mg via INTRAVENOUS
  Filled 2021-09-25: qty 2

## 2021-09-25 MED ORDER — METOCLOPRAMIDE HCL 5 MG/ML IJ SOLN
10.0000 mg | Freq: Once | INTRAMUSCULAR | Status: AC
  Administered 2021-09-26: 05:00:00 10 mg via INTRAVENOUS
  Filled 2021-09-25: qty 2

## 2021-09-25 MED ORDER — PEG-KCL-NACL-NASULF-NA ASC-C 100 G PO SOLR: 1.0000 | Freq: Once | ORAL | Status: DC

## 2021-09-25 MED ORDER — PEG-KCL-NACL-NASULF-NA ASC-C 100 G PO SOLR
0.5000 | Freq: Once | ORAL | Status: AC
  Administered 2021-09-26: 05:00:00 100 g via ORAL

## 2021-09-25 NOTE — ED Triage Notes (Signed)
Pt here from home with c/o gi bleed , syncopal episode and low hgb , pt already had a colonoscopy scheduled  ?

## 2021-09-25 NOTE — ED Notes (Signed)
Verified with IV team that blood transfusion is okay to run through 22g because of pt's limited access.  ?

## 2021-09-25 NOTE — Consult Note (Addendum)
[facility] Consult: 3:12 PM 09/25/2021  LOS: 0 days    Referring Provider: Dr Hilarie Fredrickson  Primary Care Physician:  [name], [name], MD Primary Gastroenterologist:  Dr. Hilarie Fredrickson.      Reason for Consultation:  Anemia and GI bleeding   HPI: Jane [name] is a 76 y.o. female.  PMH ESRD, HD MWF.  A-fib, chronic Coumadin.  Chronic constipation on Linzess. Dialysis associated hypotension requiring pre dialysis midodrine. Pt informs me that Dr. Einar Gip, her cardiologist, has referred her for a future appointment with Dr. Burt Knack regarding possible Watchman procedure.   GI issues include GI bleeding, upper and lower polyps.   06/2016 Sigmoid colectomy for complications of perforated diverticulitis. 10/2019 colonoscopy.  For surveillance of villous adenomatous polyps in 2017.  Total of 9 polyps, sized 5 up to 20 mm in size were resected.  Patent colocolonic anastomosis with healthy mucosa.  Path of all polyps consistent with TA's without HGD 10/2019 EGD.  For evaluation nausea, indigestion.  15 mm pedunculated polyp at gastric body not removed in anticipation of expected endoscopic mucosal resection.  Smaller sessile polyps in gastric body.  Erythema, inflammation and gastric antrum, biopsied.  4 to 5 cm frond-like/villous mass without bleeding in second duodenum distal to ampulla, biopsied. Path: Duodenal polyp: TVA.  Gastric: chronic gastritis with focal intestinal metaplasia.,  No H. pylori.   01/09/2020 EUS.  Biopsies of hemorrhagic gastritis.  Resection, retrieval of gastric polyp (hyperplastic), site clipped.  Normal major and minor papilla.  Large duodenal polyp (TVA, no carcinoma) resected via EMR, site partially closed with clips. 01/13/20 EGD.  For GI bleed characterized by melena.  Massive bleeding from site of duodenal EMR,  treated with epi, gold probe, hemospray.  Small ulcer with Hemoclip at site of gastric polypectomy. 01/2021 EGD.  For evaluation hematochezia.  Inflammatory gastric polyps.  Darkening and texture change of antral mucosa.  Duodenal scar at D2 from previous TVA resection.  Some polypoid tissue present at site, recurrence versus clip artifact, tissue sampled (benign superficial ampullary mucosa) and biopsy site closed with clips.  Prominent major papilla, biopsied (nonspecific reactive duodenal mucosa). 01/2021 colonoscopy.  For evaluation of maroon stool, negative EGD.  Poor prep.  Stool and maroon blood throughout examined colon.  Nonbleeding hemorrhoids.  Solitary bleeding vessel at splenic flexure, treated with APC. 05/2021 EGD.  For dysphagia, weight loss and prior TVA in duodenum.  Esophagus normal, no explanation for dysphagia but underwent empiric dilation.  Multiple gastric polyps biopsied (hyperplastic gastric polyp).  Melanotic antral mucosa.  Polypoid mucosa at second duodenum in area of previous EMR, biopsied ( Benign duodenal mucosa).     GI OV with nurse practitioner on 3/2 for evaluation of 2 weeks of dark stools as well as passing dark red and bright red blood per rectum.  This was occurring only with bowel movements.  Compliant with pantoprazole 40 bid, Linzess qod, Coumadin.  INR 4.2 on 09/17/2021 prompting reduction in Coumadin dosing.  Hgb 11.4 on 2/8, 14.3 on 2/16, 9.6 on 3/2. She was set up to undergo repeat  colonoscopy and SBE on 3/14 and held Coumadin as of Friday 3/3. Patient reports some mild left abdominal discomfort that feels like she needs to have a bowel movement.  When she passes blood or stool the discomfort resolves but recurs within a few hours.  Also reports at least 4 episodes of syncope without any sustained bodily trauma.  Last episode of bleeding was about 6 PM yesterday evening.  She feels very weak with shortness of breath.  Has chest pain but  occurrs when she lays down  flat at night to sleep, if she sits up the pain resolves.  No exertional chest pressure/pain.  No swelling in her legs.  Has chronic nausea with foamy regurgitation that has been present for months.  Denies dysphagia.  Repeat labs today with Hgb down to 7.7, MCV 92.  Normal platelets and WBCs. INR 1.9 yesterday. GI office contacted the patient and advised her to proceed to the [facility] ED. Blood pressure in triage 155/54 with heart rate 95.  Patient lives alone.  Not a smoker or a drinker.      Past Medical History:  Diagnosis Date   Acid reflux    Anemia of chronic disease    Arthritis    Asthma    Atrial fibrillation ([place])    Bilateral carotid bruits    Complication of anesthesia    "hard to wake up, I have sleep apnea" no CPAP   Diverticulitis    Duodenal ulcer    Dysrhythmia    Afib   ESRD (end stage renal disease) ([place])    MWF [place]   Headache    Heart murmur    History of blood transfusion    Hypertension    Malaise and fatigue    Orthostatic hypotension    Shortness of breath    " when I walk to fast"   Sleep apnea    Syncope    Tubulovillous adenoma of colon     Past Surgical History:  Procedure Laterality Date   A/V FISTULAGRAM Right 10/18/2020   Procedure: A/V FISTULAGRAM;  Surgeon: Marty Heck, MD;  Location: [place] CV LAB;  Service: Cardiovascular;  Laterality: Right;   A/V FISTULAGRAM Right 09/05/2021   Procedure: A/V Fistulagram;  Surgeon: Marty Heck, MD;  Location: [place] CV LAB;  Service: Cardiovascular;  Laterality: Right;   AV FISTULA PLACEMENT     BACK SURGERY     Lumbar fusion L 4 and L 5   BIOPSY  01/09/2020   Procedure: BIOPSY;  Surgeon: Rush Landmark Telford Nab., MD;  Location: [place];  Service: Gastroenterology;;   BIOPSY  01/31/2021   Procedure: BIOPSY;  Surgeon: Irving Copas., MD;  Location: [place];  Service: Gastroenterology;;   COLONOSCOPY     COLONOSCOPY N/A 02/01/2021   Procedure:  COLONOSCOPY;  Surgeon: Yetta Flock, MD;  Location: [place];  Service: Gastroenterology;  Laterality: N/A;   ENDOSCOPIC MUCOSAL RESECTION N/A 01/09/2020   Procedure: ENDOSCOPIC MUCOSAL RESECTION;  Surgeon: Rush Landmark Telford Nab., MD;  Location: [place];  Service: Gastroenterology;  Laterality: N/A;   ENDOSCOPIC MUCOSAL RESECTION N/A 01/31/2021   Procedure: ENDOSCOPIC MUCOSAL RESECTION;  Surgeon: Rush Landmark Telford Nab., MD;  Location: [place];  Service: Gastroenterology;  Laterality: N/A;   ESOPHAGOGASTRODUODENOSCOPY     ESOPHAGOGASTRODUODENOSCOPY (EGD) WITH PROPOFOL N/A 01/09/2020   Procedure: ESOPHAGOGASTRODUODENOSCOPY (EGD) WITH PROPOFOL;  Surgeon: Rush Landmark Telford Nab., MD;  Location: [place];  Service: Gastroenterology;  Laterality: N/A;   ESOPHAGOGASTRODUODENOSCOPY (EGD) WITH PROPOFOL  N/A 01/13/2020   Procedure: ESOPHAGOGASTRODUODENOSCOPY (EGD) WITH PROPOFOL;  Surgeon: Irene Shipper, MD;  Location: [place];  Service: Gastroenterology;  Laterality: N/A;   ESOPHAGOGASTRODUODENOSCOPY (EGD) WITH PROPOFOL N/A 01/31/2021   Procedure: ESOPHAGOGASTRODUODENOSCOPY (EGD) WITH PROPOFOL;  Surgeon: Rush Landmark Telford Nab., MD;  Location: [place];  Service: Gastroenterology;  Laterality: N/A;   EUS N/A 01/09/2020   Procedure: UPPER ENDOSCOPIC ULTRASOUND (EUS) RADIAL;  Surgeon: Irving Copas., MD;  Location: [place];  Service: Gastroenterology;  Laterality: N/A;   HEMOSTASIS CLIP PLACEMENT  01/09/2020   Procedure: HEMOSTASIS CLIP PLACEMENT;  Surgeon: Irving Copas., MD;  Location: [place];  Service: Gastroenterology;;   HEMOSTASIS CLIP PLACEMENT  01/31/2021   Procedure: HEMOSTASIS CLIP PLACEMENT;  Surgeon: Irving Copas., MD;  Location: [place];  Service: Gastroenterology;;   HEMOSTASIS CONTROL  01/13/2020   Procedure: HEMOSTASIS CONTROL;  Surgeon: Irene Shipper, MD;  Location: [facility] END[facility];  Service: Gastroenterology;;  hemaspray    HOT HEMOSTASIS N/A 01/13/2020   Procedure: HOT HEMOSTASIS (ARGON PLASMA COAGULATION/BICAP);  Surgeon: Irene Shipper, MD;  Location: [place];  Service: Gastroenterology;  Laterality: N/A;   HOT HEMOSTASIS N/A 02/01/2021   Procedure: HOT HEMOSTASIS (ARGON PLASMA COAGULATION/BICAP);  Surgeon: Yetta Flock, MD;  Location: [facility] ENDOSCOPY;  Service: Gastroenterology;  Laterality: N/A;   IR GENERIC HISTORICAL  07/09/2016   IR US GUIDE VASC ACCESS RIGHT 07/09/2016 Arne Cleveland, MD [facility]   IR GENERIC HISTORICAL  07/09/2016   IR FLUORO GUIDE CV LINE RIGHT 07/09/2016 Arne Cleveland, MD [facility]   KNEE ARTHROPLASTY Left    LAPAROSCOPIC SIGMOID COLECTOMY N/A 07/11/2016   Procedure: LAPAROSCOPIC SIGMOID COLECTOMY;  Surgeon: Clovis Riley, MD;  Location: [place];  Service: General;  Laterality: N/A;   MASS EXCISION Right 04/10/2020   Procedure: EXCISION SKIN NODULE RIGHT FOREARM;  Surgeon: Waynetta Sandy, MD;  Location: [place];  Service: Vascular;  Laterality: Right;   SCLEROTHERAPY  01/09/2020   Procedure: Clide Deutscher;  Surgeon: Mansouraty, Telford Nab., MD;  Location: [place];  Service: Gastroenterology;;   Clide Deutscher  01/13/2020   Procedure: Clide Deutscher;  Surgeon: Irene Shipper, MD;  Location: [facility] ENDOSCOPY;  Service: Gastroenterology;;   Maryagnes Amos INJECTION  01/09/2020   Procedure: SUBMUCOSAL LIFTING INJECTION;  Surgeon: Irving Copas., MD;  Location: [place];  Service: Gastroenterology;;   TUBAL LIGATION      Prior to Admission medications   Medication Sig Start Date End Date Taking? Authorizing Provider  atorvastatin (LIPITOR) 10 MG tablet Take 1 tablet (10 mg total) by mouth daily. 08/13/21 11/11/21  Adrian Prows, MD  B Complex-C-Zn-Folic Acid (DIALYVITE 790 WITH ZINC) 0.8 MG TABS Take 1 tablet by mouth in the morning. 07/19/20   [provider]  CALCITRIOL PO Take by mouth every Monday, Wednesday, and Friday with hemodialysis.  04/05/21 04/04/22  [provider]  ethyl chloride spray Apply 1 application topically every Monday, Wednesday, and Friday with hemodialysis. 12/25/17   [provider]  linaclotide Rolan Lipa) 145 MCG CAPS capsule TAKE 1 CAPSULE BY MOUTH ONCE DAILY BEFORE BREAKFAST 08/13/21   Pyrtle, Lajuan Lines, MD  Methoxy PEG-Epoetin Beta (MIRCERA IJ) Mircera 08/21/21 08/20/22  [provider]  midodrine (PROAMATINE) 10 MG tablet Take 1 tablet (10 mg total) by mouth 3 (three) times daily as needed (low BP and dizziness and lightheadedness symptoms.). Patient taking differently: Take 10 mg by mouth 3 (three) times daily as needed (low BP and dizziness and lightheadedness symptoms. SBP <130 mmHg). 09/17/21  Adrian Prows, MD  pantoprazole (PROTONIX) 40 MG tablet Take 1 tablet (40 mg total) by mouth 2 (two) times daily. 08/13/21   Pyrtle, Lajuan Lines, MD  sevelamer carbonate (RENVELA) 800 MG tablet Take 800 mg by mouth 3 (three) times daily. 08/26/21   [provider]  warfarin (COUMADIN) 5 MG tablet Take 1 tablet (5 mg total) by mouth daily. Or as directed by coumadin clinic. Patient taking differently: Take 5-7.5 mg by mouth See admin instructions. Take 1 tablet (5 mg) by mouth on Sundays, Mondays, Wednesdays, Fridays & Saturdays in the morning. Take 1.5 tablets (7.5 mg) by mouth on Tuesdays & Thursdays in the morning 08/21/21   Adrian Prows, MD    Scheduled Meds:  Infusions:  PRN Meds:    Allergies as of 09/25/2021 - Review Complete 09/24/2021  Allergen Reaction Noted   Latex Rash 07/09/2016   Penicillins Other (See Comments) 01/23/2014   Sulfa antibiotics Rash 01/23/2014   Tape Other (See Comments) 12/07/2012    Family History  Problem Relation Age of Onset   Heart failure Mother    Stroke Mother    Other Father    Colon cancer Neg Hx    Liver disease Neg Hx    Esophageal cancer Neg Hx    Stomach cancer Neg Hx    Inflammatory bowel disease Neg Hx    Rectal cancer Neg Hx    Pancreatic  cancer Neg Hx     Social History   Socioeconomic History   Marital status: Divorced    Spouse name: Not on file   Number of children: 4   Years of education: 14   Highest education level: Associate degree: occupational, Hotel manager, or vocational program  Occupational History   Occupation: Retired  Tobacco Use   Smoking status: Never    Passive exposure: Never   Smokeless tobacco: Never  Vaping Use   Vaping Use: Never used  Substance and Sexual Activity   Alcohol use: No   Drug use: No   Sexual activity: Never  Other Topics Concern   Not on file  Social History Narrative   HH 1   Divorced   Outpatient dialysis Mon, Wed, Fri   4 children: 1 daughter locally is an Designer, multimedia and 3 sons in [place] Determinants of Health   Financial Resource Strain: High Risk   Difficulty of Paying Living Expenses: Very hard  Food Insecurity: Landscape architect Present   Worried About Charity fundraiser in the Last Year: Often true   Arboriculturist in the Last Year: Often true  Transportation Needs: Unmet Transportation Needs   Lack of Transportation (Medical): Yes   Lack of Transportation (Non-Medical): No  Physical Activity: Inactive   Days of Exercise per Week: 0 days   Minutes of Exercise per Session: 0 min  Stress: Stress Concern Present   Feeling of Stress : Very much  Social Connections: Not on file  Intimate Partner Violence: Not on file    REVIEW OF SYSTEMS: Constitutional: Weakness, fatigue ENT:  No nose bleeds Pulm: Dyspnea on exertion.  No cough.  No resting shortness of breath. CV:  No palpitations, no LE edema.  Atypical chest pain as per HPI. GU: Previously had only [place] but for the last 4 to 6 weeks has become an uric. GI: See HPI Heme: Is unusual bleeding or bruising. Transfusions: Previous blood transfusions with FFP, PRBCs in June/July 2021 Neuro: Syncopal spells per HPI. Derm:  No itching,  no rash or sores.  Endocrine:  No sweats or chills.  No  polyuria or dysuria Immunization: Reviewed. Travel: Not queried.   PHYSICAL EXAM: Vital signs in last 24 hours: There were no vitals filed for this visit. Wt Readings from Last 3 Encounters:  09/24/21 83.6 kg  09/24/21 83.6 kg  09/19/21 83.5 kg    General: Pleasant, pale but otherwise well-appearing and alert, comfortable.  Appears younger than stated age of 73. Head: No facial asymmetry or swelling.  No signs of head trauma. Eyes: No scleral icterus.  Conjunctiva is pale. Ears: Not hard of hearing Nose: No congestion or discharge Mouth: Fair dentition.  Tongue midline.  Mucosa a bit pale but moist and clear. Neck: No JVD, masses, thyromegaly. Lungs: Clear bilaterally with good breath sounds.  No dyspnea.  No cough Heart: RRR.  Rate controlled.  No MRG.  Audible S1, S2 Abdomen: Soft.  Minimal if any tenderness on the left abdomen no guarding or rebound.  Bowel sounds active.  No distention.  No organomegaly, bruits, hernias.   Rectal: Not performed. Musc/Skeltl: No joint redness, swelling or gross deformity. Extremities: Dialysis fistula on right forearm bandaged with a little bit of dried blood.  No lower extremity edema Neurologic: Alert.  Oriented x3.  Good historian.  Moves all 4 limbs without tremor or gross weakness. Skin: Pale, no suspicious lesions, rashes. Tattoos: None observed Nodes: No cervical adenopathy Psych: Pleasant, cooperative.  Fluid speech.  Intake/Output from previous day: No intake/output data recorded. Intake/Output this shift: No intake/output data recorded.  LAB RESULTS: Recent Labs    09/25/21 1151  WBC 6.1  HGB 7.7 Repeated and verified X2.*  HCT 23.6 Repeated and verified X2.*  PLT 201.0   BMET Lab Results  Component Value Date   NA 131 (L) 09/19/2021   NA 136 09/05/2021   NA 137 04/29/2021   K 5.5 No hemolysis seen (H) 09/19/2021   K 3.9 09/05/2021   K 3.3 (L) 04/29/2021   CL 92 (L) 09/19/2021   CL 97 (L) 09/05/2021   CL 101  04/29/2021   CO2 26 09/19/2021   CO2 26 04/29/2021   CO2 30 02/01/2021   GLUCOSE 95 09/19/2021   GLUCOSE 86 09/05/2021   GLUCOSE 110 (H) 04/29/2021   BUN 83 (H) 09/19/2021   BUN 45 (H) 09/05/2021   BUN 36 (H) 04/29/2021   CREATININE 11.31 (HH) 09/19/2021   CREATININE 7.80 (H) 09/05/2021   CREATININE 9.77 (H) 04/29/2021   CALCIUM 10.4 09/19/2021   CALCIUM 9.8 04/29/2021   CALCIUM 8.9 02/01/2021   LFT No results for input(s): PROT, ALBUMIN, AST, ALT, ALKPHOS, BILITOT, BILIDIR, IBILI in the last 72 hours. PT/INR Lab Results  Component Value Date   INR 1.9 (A) 09/24/2021   INR 4.2 (A) 09/17/2021   INR 1.7 (H) 09/05/2021   Hepatitis Panel No results for input(s): HEPBSAG, HCVAB, HEPAIGM, HEPBIGM in the last 72 hours. C-Diff No components found for: CDIFF Lipase     Component Value Date/Time   LIPASE 27 08/04/2016 0730    Drugs of Abuse  No results found for: LABOPIA, COCAINSCRNUR, LABBENZ, AMPHETMU, THCU, LABBARB   RADIOLOGY STUDIES: No results found.    IMPRESSION:     3 weeks of melena and dark/bright red blood PR in pt on Coumadin, stopped med 3/3. Previous hx GI bleeding with EGD findings of gastric and duodenal polyps, ultimately endoscopically resected.  At 01/2021 colonoscopy a solitary, actively bleeding vessel at splenic flexure was treated.  Additionally has had resections of multiple adenomatous colon polyps     Progressive, normocytic anemia.  Hb down 2 g over less than 1 week.  Symptomatic with episodes of syncope.  Atrial fibrillation.  Chronic Coumadin, on hold since 3/3.  INR yesterday subtherapeutic at 1.9.  Referral in  Dr. Burt Knack for consideration of Watchman procedure according to the patient.     ESRD.  On hemodialysis MWF.     Chronic constipation, on Linzess.      PLAN:        Admit to hospitalist service.  Probably needs transfusion of 1 PRBC but will defer decision to hospitalist.   Recheck CBC in the morning.  Colonoscopy and small  bowel enteroscopy set up for 2 PM tomorrow.  She can have clears tonight.  Give p.o. Dulcolax now.  Split dose movie prep with 1 portion this evening and the other early tomorrow.  Give Reglan IV prior to both stages of prep to minimize nausea.   Azucena Freed  09/25/2021, 3:12 PM Phone [phone]  _______________________________________________________________________________________________________________________________________  Velora Heckler GI MD note:  I personally examined the patient, reviewed the data and agree with the assessment and plan described above.  I provided a substantive portion of the care of this patient (personally provided more than half of the total time dedicated to the treatment of this patient.) she is 57 with multiple comorbid conditions and is on a blood thinner chronically.She's having dark stools, bloody stools. Many potential sources. We were planning outpatient procedure but her anemia, bleeding has worsened.  She ate a full lunch today but hopefully she'll be prepped and ready for colonoscopy, EGD with enteroscopy tomorrow.   Owens Loffler, MD [facility] Gastroenterology Pager [phone]

## 2021-09-25 NOTE — ED Provider Notes (Signed)
?[facility] ?Provider Note ? ? ?CSN: [id number] ?Arrival date & time: 09/25/21  1509 ? ?  ? ?History ? ?No chief complaint on file. ? ? ?Jane [name] is a 76 y.o. female. ? ?Pt is a 76 yo female with pmh of paroxysmal afib previously on warfarin, stopped 09/20/21, presenting to ED for GI bleed. Pt sent by [facility] specialist Dr. Raquel James and Dr. Bryan Lemma after being found to have a 2g  hemoglobin drop in less than one week. Pt admits to both melena and bright red blood with bowel movements. Admits to light headedness, dizziness, and syncope without head trauma.  ? ?Previous hx of colectomy secondary to severe diverticulitis.  ? ?The history is provided by the patient. No language interpreter was used.  ? ?  ? ?Home Medications ?Prior to Admission medications   ?Medication Sig Start Date End Date Taking? Authorizing Provider  ?atorvastatin (LIPITOR) 10 MG tablet Take 1 tablet (10 mg total) by mouth daily. 08/13/21 11/11/21  Adrian Prows, MD  ?B Complex-C-Zn-Folic Acid (DIALYVITE 419 WITH ZINC) 0.8 MG TABS Take 1 tablet by mouth in the morning. 07/19/20   [provider]  ?CALCITRIOL PO Take by mouth every Monday, Wednesday, and Friday with hemodialysis. 04/05/21 04/04/22  [provider]  ?ethyl chloride spray Apply 1 application topically every Monday, Wednesday, and Friday with hemodialysis. 12/25/17   [provider]  ?linaclotide Rolan Lipa) 145 MCG CAPS capsule TAKE 1 CAPSULE BY MOUTH ONCE DAILY BEFORE BREAKFAST 08/13/21   Pyrtle, Lajuan Lines, MD  ?Methoxy PEG-Epoetin Beta (MIRCERA IJ) Mircera 08/21/21 08/20/22  [provider]  ?midodrine (PROAMATINE) 10 MG tablet Take 1 tablet (10 mg total) by mouth 3 (three) times daily as needed (low BP and dizziness and lightheadedness symptoms.). ?Patient taking differently: Take 10 mg by mouth 3 (three) times daily as needed (low BP and dizziness and lightheadedness symptoms. SBP <130 mmHg). 09/17/21   Adrian Prows, MD   ?pantoprazole (PROTONIX) 40 MG tablet Take 1 tablet (40 mg total) by mouth 2 (two) times daily. 08/13/21   Pyrtle, Lajuan Lines, MD  ?sevelamer carbonate (RENVELA) 800 MG tablet Take 800 mg by mouth 3 (three) times daily. 08/26/21   [provider]  ?warfarin (COUMADIN) 5 MG tablet Take 1 tablet (5 mg total) by mouth daily. Or as directed by coumadin clinic. ?Patient taking differently: Take 5-7.5 mg by mouth See admin instructions. Take 1 tablet (5 mg) by mouth on Sundays, Mondays, Wednesdays, Fridays & Saturdays in the morning. ?Take 1.5 tablets (7.5 mg) by mouth on Tuesdays & Thursdays in the morning 08/21/21   Adrian Prows, MD  ?   ? ?Allergies    ?Latex, Penicillins, Sulfa antibiotics, and Tape   ? ?Review of Systems   ?Review of Systems  ?Constitutional:  Negative for chills and fever.  ?HENT:  Negative for ear pain and sore throat.   ?Eyes:  Negative for pain and visual disturbance.  ?Respiratory:  Negative for cough and shortness of breath.   ?Cardiovascular:  Negative for chest pain and palpitations.  ?Gastrointestinal:  Positive for blood in stool. Negative for abdominal pain and vomiting.  ?Genitourinary:  Negative for dysuria and hematuria.  ?Musculoskeletal:  Negative for arthralgias and back pain.  ?Skin:  Negative for color change and rash.  ?Neurological:  Negative for seizures and syncope.  ?All other systems reviewed and are negative. ? ?Physical Exam ?Updated Vital Signs ?BP (!) 155/54 (BP Location: Left Arm)   Pulse 95  Temp 98.4 ?F (36.9 ?C) (Oral)   Resp 16   SpO2 100%  ?Physical Exam ?Vitals and nursing note reviewed.  ?Constitutional:   ?   General: She is not in acute distress. ?   Appearance: She is well-developed.  ?HENT:  ?   Head: Normocephalic and atraumatic.  ?Eyes:  ?   Conjunctiva/sclera: Conjunctivae normal.  ?Cardiovascular:  ?   Rate and Rhythm: Normal rate and regular rhythm.  ?   Heart sounds: No murmur heard. ?Pulmonary:  ?   Effort: Pulmonary effort is normal. No respiratory  distress.  ?   Breath sounds: Normal breath sounds.  ?Abdominal:  ?   Palpations: Abdomen is soft.  ?   Tenderness: There is no abdominal tenderness.  ?Musculoskeletal:     ?   General: No swelling.  ?   Cervical back: Neck supple.  ?Skin: ?   General: Skin is warm and dry.  ?   Capillary Refill: Capillary refill takes less than 2 seconds.  ?   Coloration: Skin is pale.  ?Neurological:  ?   Mental Status: She is alert.  ?Psychiatric:     ?   Mood and Affect: Mood normal.  ? ? ?ED Results / Procedures / Treatments   ?Labs ?(all labs ordered are listed, but only abnormal results are displayed) ?Labs Reviewed  ?RESP PANEL BY RT-PCR (FLU A&B, COVID) [id number]  ?COMPREHENSIVE METABOLIC PANEL  ?CBC  ?POC OCCULT BLOOD, ED  ?TYPE AND SCREEN  ? ? ?EKG ?None ? ?Radiology ?DG Chest 2 View ? ?Result Date: 09/25/2021 ?CLINICAL DATA:  Shortness of breath. EXAM: CHEST - 2 VIEW COMPARISON:  Chest x-ray 09/05/2021 FINDINGS: The heart size and mediastinal contours are within normal limits. Both lungs are clear. No acute fractures. Lumbar fusion hardware is present. Vascular stent in the right upper arm. IMPRESSION: No active cardiopulmonary disease. Electronically Signed   By: Ronney Asters M.D.   On: 09/25/2021 16:01   ? ?Procedures ?Marland KitchenCritical Care ?Performed by: Lianne Cure, DO ?Authorized by: Lianne Cure, DO  ? ?Critical care provider statement:  ?  Critical care time (minutes):  47 ?  Critical care was necessary to treat or prevent imminent or life-threatening deterioration of the following conditions: anemia requiring transfusion. ?  Critical care was time spent personally by me on the following activities:  Development of treatment plan with patient or surrogate, discussions with consultants, evaluation of patient's response to treatment, examination of patient, ordering and review of laboratory studies, ordering and review of radiographic studies, ordering and performing treatments and interventions, pulse oximetry,  re-evaluation of patient's condition and review of old charts ?  Care discussed with: admitting provider   ?  Care discussed with comment:  GI specialist  ? ? ?Medications Ordered in ED ?Medications  ?bisacodyl (DULCOLAX) EC tablet 20 mg (has no administration in time range)  ?peg 3350 powder (MOVIPREP) kit 200 g (has no administration in time range)  ? ? ?ED Course/ Medical Decision Making/ A&P ?  ?                        ?Medical Decision Making ?Amount and/or Complexity of Data Reviewed ?Labs: ordered. ? ?Risk ?Prescription drug management. ?Decision regarding hospitalization. ? ? ?4:30 PM ?76 yo female with pmh of paroxysmal afib previously on warfarin, stopped 09/20/21, presenting to ED for GI bleed. Pt sent by [facility] specialist Dr. Bryan Lemma after being found to have a 2g  hemoglobin  drop in less than one week. ? ?Hemoglobin currently 7.9. Patient transferred for symptomatic anemia with active GI hemorrhage. Risks versus benefits of transfusion discussed in detail. Patient transfused 1 u pRBC here in ED. Patient accepted by [facility] with orders placed for colonoscopy in morning. Patient accepted by admitting team.  ? ? ? ? ? ? ? ?Final Clinical Impression(s) / ED Diagnoses ?Final diagnoses:  ?Gastrointestinal hemorrhage, unspecified gastrointestinal hemorrhage type  ?Symptomatic anemia  ? ? ?Rx / DC Orders ?ED Discharge Orders   ? ? None  ? ?  ? ? ?  ?Lianne Cure, DO ?34/91/79 1040 ? ?

## 2021-09-25 NOTE — ED Notes (Signed)
ED TO INPATIENT HANDOFF REPORT  ED Nurse Name and Phone #: [phone]  S Name/Age/Gender Jane [name] 76 y.o. female Room/Bed: 016C/016C  Code Status   Code Status: Prior  Home/SNF/Other Home Patient oriented to: self, place, time, and situation Is this baseline? Yes   Triage Complete: Triage complete  Chief Complaint Acute GI bleeding [K92.2]  Triage Note Pt here from home with c/o gi bleed , syncopal episode and low hgb , pt already had a colonoscopy scheduled    Allergies Allergies  Allergen Reactions   Latex Rash   Penicillins Other (See Comments)    Yeast infection / Childhood   Sulfa Antibiotics Rash   Tape Other (See Comments)    Plastic, silicone, and paper tape causes bruising and pulls off skin. Cloth tape works fine    Level of Care/Admitting Diagnosis ED Disposition     ED Disposition  Admit   Condition  --   Comment  Hospital Area: [facility] [100100]  Level of Care: Progressive [102]  Admit to Progressive based on following criteria: GI, ENDOCRINE disease patients with GI bleeding, acute liver failure or pancreatitis, stable with diabetic ketoacidosis or thyrotoxicosis (hypothyroid) state.  May admit patient to [facility] or [facility] if equivalent level of care is available:: No  Covid Evaluation: Asymptomatic Screening Protocol (No Symptoms)  Diagnosis: Acute GI bleeding [253168]  Admitting Physician: Orene Desanctis [[id number]]  Attending Physician: Orene Desanctis [[id number]]  Estimated length of stay: past midnight tomorrow  Certification:: I certify this patient will need inpatient services for at least 2 midnights          B Medical/Surgery History Past Medical History:  Diagnosis Date   Acid reflux    Anemia of chronic disease    Arthritis    Asthma    Atrial fibrillation ([place])    Bilateral carotid bruits    Complication of anesthesia    "hard to wake up, I have sleep apnea" no CPAP   Diverticulitis    Duodenal ulcer     Dysrhythmia    Afib   ESRD (end stage renal disease) ([place])    [facility]   Headache    Heart murmur    History of blood transfusion    Hypertension    Malaise and fatigue    Orthostatic hypotension    Shortness of breath    " when I walk to fast"   Sleep apnea    Syncope    Tubulovillous adenoma of colon    Past Surgical History:  Procedure Laterality Date   A/V FISTULAGRAM Right 10/18/2020   Procedure: A/V FISTULAGRAM;  Surgeon: Marty Heck, MD;  Location: [facility];  Service: Cardiovascular;  Laterality: Right;   A/V FISTULAGRAM Right 09/05/2021   Procedure: A/V Fistulagram;  Surgeon: Marty Heck, MD;  Location: [facility];  Service: Cardiovascular;  Laterality: Right;   AV FISTULA PLACEMENT     BACK SURGERY     Lumbar fusion L 4 and L 5   BIOPSY  01/09/2020   Procedure: BIOPSY;  Surgeon: Rush Landmark Telford Nab., MD;  Location: [facility];  Service: Gastroenterology;;   BIOPSY  01/31/2021   Procedure: BIOPSY;  Surgeon: Irving Copas., MD;  Location: [facility];  Service: Gastroenterology;;   COLONOSCOPY     COLONOSCOPY N/A 02/01/2021   Procedure: COLONOSCOPY;  Surgeon: Yetta Flock, MD;  Location: [facility];  Service: Gastroenterology;  Laterality: N/A;   ENDOSCOPIC  MUCOSAL RESECTION N/A 01/09/2020   Procedure: ENDOSCOPIC MUCOSAL RESECTION;  Surgeon: Rush Landmark Telford Nab., MD;  Location: [facility];  Service: Gastroenterology;  Laterality: N/A;   ENDOSCOPIC MUCOSAL RESECTION N/A 01/31/2021   Procedure: ENDOSCOPIC MUCOSAL RESECTION;  Surgeon: Rush Landmark Telford Nab., MD;  Location: [facility];  Service: Gastroenterology;  Laterality: N/A;   ESOPHAGOGASTRODUODENOSCOPY     ESOPHAGOGASTRODUODENOSCOPY (EGD) WITH PROPOFOL N/A 01/09/2020   Procedure: ESOPHAGOGASTRODUODENOSCOPY (EGD) WITH PROPOFOL;  Surgeon: Rush Landmark Telford Nab., MD;  Location: [facility];  Service: Gastroenterology;  Laterality: N/A;    ESOPHAGOGASTRODUODENOSCOPY (EGD) WITH PROPOFOL N/A 01/13/2020   Procedure: ESOPHAGOGASTRODUODENOSCOPY (EGD) WITH PROPOFOL;  Surgeon: Irene Shipper, MD;  Location: [facility];  Service: Gastroenterology;  Laterality: N/A;   ESOPHAGOGASTRODUODENOSCOPY (EGD) WITH PROPOFOL N/A 01/31/2021   Procedure: ESOPHAGOGASTRODUODENOSCOPY (EGD) WITH PROPOFOL;  Surgeon: Rush Landmark Telford Nab., MD;  Location: [facility];  Service: Gastroenterology;  Laterality: N/A;   EUS N/A 01/09/2020   Procedure: UPPER ENDOSCOPIC ULTRASOUND (EUS) RADIAL;  Surgeon: Irving Copas., MD;  Location: [facility];  Service: Gastroenterology;  Laterality: N/A;   HEMOSTASIS CLIP PLACEMENT  01/09/2020   Procedure: HEMOSTASIS CLIP PLACEMENT;  Surgeon: Irving Copas., MD;  Location: [facility];  Service: Gastroenterology;;   HEMOSTASIS CLIP PLACEMENT  01/31/2021   Procedure: HEMOSTASIS CLIP PLACEMENT;  Surgeon: Irving Copas., MD;  Location: [facility];  Service: Gastroenterology;;   HEMOSTASIS CONTROL  01/13/2020   Procedure: HEMOSTASIS CONTROL;  Surgeon: Irene Shipper, MD;  Location[facility];  Service: Gastroenterology;;  hemaspray   HOT HEMOSTASIS N/A 01/13/2020   Procedure: HOT HEMOSTASIS (ARGON PLASMA COAGULATION/BICAP);  Surgeon: Irene Shipper, MD;  Location: [facility];  Service: Gastroenterology;  Laterality: N/A;   HOT HEMOSTASIS N/A 02/01/2021   Procedure: HOT HEMOSTASIS (ARGON PLASMA COAGULATION/BICAP);  Surgeon: Yetta Flock, MD;  [facility];  Service: Gastroenterology;  Laterality: N/A;   IR GENERIC HISTORICAL  07/09/2016   IR US GUIDE VASC ACCESS RIGHT 07/09/2016 Arne Cleveland, MD [facility]   IR GENERIC HISTORICAL  07/09/2016   IR FLUORO GUIDE CV LINE RIGHT 07/09/2016 Arne Cleveland, MD [facility]   KNEE ARTHROPLASTY Left    LAPAROSCOPIC SIGMOID COLECTOMY N/A 07/11/2016   Procedure: LAPAROSCOPIC SIGMOID COLECTOMY;  Surgeon: Clovis Riley, MD;  Location: [facility];  Service: General;  Laterality: N/A;   MASS EXCISION Right 04/10/2020   Procedure: EXCISION SKIN NODULE RIGHT FOREARM;  Surgeon: Waynetta Sandy, MD;  Location: [facility];  Service: Vascular;  Laterality: Right;   SCLEROTHERAPY  01/09/2020   Procedure: Clide Deutscher;  Surgeon: Mansouraty, Telford Nab., MD;  Location: [facility];  Service: Gastroenterology;;   Clide Deutscher  01/13/2020   Procedure: Clide Deutscher;  Surgeon: Irene Shipper, MD;  Location: [facility] ENDOSCOPY;  Service: Gastroenterology;;   Maryagnes Amos INJECTION  01/09/2020   Procedure: SUBMUCOSAL LIFTING INJECTION;  Surgeon: Irving Copas., MD;  Location: [facility] ENDOSCOPY;  Service: Gastroenterology;;   TUBAL LIGATION       A IV Location/Drains/Wounds Patient Lines/Drains/Airways Status     Active Line/Drains/Airways     Name Placement date Placement time Site Days   Peripheral IV 09/25/21 22 G 1.75" Anterior;Left Forearm 09/25/21  1748  Forearm  less than 1   Fistula / Graft Right Forearm Arteriovenous fistula --  --  Forearm  --   Incision (Closed) 07/11/16 Perineum Other (Comment) 07/11/16  1039  -- 1902   Incision (Closed) 07/11/16 Abdomen Other (Comment) 07/11/16  1039  -- 1902   Incision (Closed) 04/10/20  Arm Right 04/10/20  1033  -- 533   Incision - 4 Ports Abdomen 1: Medial;Lower 2: Left;Superior;Lateral 3: Right;Superior;Lateral 4: Right;Mid;Lateral 07/11/16  0956  -- 1902            Intake/Output Last 24 hours  Intake/Output Summary (Last 24 hours) at 09/25/2021 2102 Last data filed at 09/25/2021 1820 Gross per 24 hour  Intake 389 ml  Output --  Net 389 ml    Labs/Imaging Results for orders placed or performed during the hospital encounter of 09/25/21 (from the past 48 hour(s))  Type and screen [place]     Status: None (Preliminary result)   Collection Time: 09/25/21  3:30 PM  Result Value Ref Range   ABO/RH(D) O POS    Antibody Screen NEG    Sample Expiration  09/28/2021,2359    Unit Number [id number]    Blood Component Type RBC LR PHER1    Unit division 00    Status of Unit ISSUED    Transfusion Status OK TO TRANSFUSE    Crossmatch Result      Compatible Performed at [facility], [address] [place], [place] [address]   Resp Panel by RT-PCR (Flu A&B, Covid) Nasopharyngeal Swab     Status: None   Collection Time: 09/25/21  3:32 PM   Specimen: Nasopharyngeal Swab; Nasopharyngeal(NP) swabs in vial transport medium  Result Value Ref Range   SARS Coronavirus 2 by RT PCR NEGATIVE NEGATIVE    Comment: (NOTE) SARS-CoV-2 target nucleic acids are NOT DETECTED.  The SARS-CoV-2 RNA is generally detectable in upper respiratory specimens during the acute phase of infection. The lowest concentration of SARS-CoV-2 viral copies this assay can detect is 138 copies/mL. A negative result does not preclude SARS-Cov-2 infection and should not be used as the sole basis for treatment or other patient management decisions. A negative result may occur with  improper specimen collection/handling, submission of specimen other than nasopharyngeal swab, presence of viral mutation(s) within the areas targeted by this assay, and inadequate number of viral copies(<138 copies/mL). A negative result must be combined with clinical observations, patient history, and epidemiological information. The expected result is Negative.  Fact Sheet for Patients:  EntrepreneurPulse.com.au  Fact Sheet for Healthcare Providers:  IncredibleEmployment.be  This test is no t yet approved or cleared by the [place] FDA and  has been authorized for detection and/or diagnosis of SARS-CoV-2 by FDA under an Emergency Use Authorization (EUA). This EUA will remain  in effect (meaning this test can be used) for the duration of the COVID-19 declaration under Section 564(b)(1) of the Act, 21 U.S.C.section 360bbb-3(b)(1), unless the  authorization is terminated  or revoked sooner.       Influenza A by PCR NEGATIVE NEGATIVE   Influenza B by PCR NEGATIVE NEGATIVE    Comment: (NOTE) The Xpert Xpress SARS-CoV-2/FLU/RSV plus assay is intended as an aid in the diagnosis of influenza from Nasopharyngeal swab specimens and should not be used as a sole basis for treatment. Nasal washings and aspirates are unacceptable for Xpert Xpress SARS-CoV-2/FLU/RSV testing.  Fact Sheet for Patients: EntrepreneurPulse.com.au  Fact Sheet for Healthcare Providers: IncredibleEmployment.be  This test is not yet approved or cleared by the [place] FDA and has been authorized for detection and/or diagnosis of SARS-CoV-2 by FDA under an Emergency Use Authorization (EUA). This EUA will remain in effect (meaning this test can be used) for the duration of the COVID-19 declaration under Section 564(b)(1) of the Act, 21  U.S.C. section 360bbb-3(b)(1), unless the authorization is terminated or revoked.  Performed at [facility], [place] [address] [place], [place] [address]   Comprehensive metabolic panel     Status: Abnormal   Collection Time: 09/25/21  3:34 PM  Result Value Ref Range   Sodium 138 135 - 145 mmol/L   Potassium 3.8 3.5 - 5.1 mmol/L   Chloride 100 98 - 111 mmol/L   CO2 28 22 - 32 mmol/L   Glucose, Bld 117 (H) 70 - 99 mg/dL    Comment: Glucose reference range applies only to samples taken after fasting for at least 8 hours.   BUN 17 8 - 23 mg/dL   Creatinine, Ser 5.42 (H) 0.44 - 1.00 mg/dL   Calcium 9.9 8.9 - 10.3 mg/dL   Total Protein 7.5 6.5 - 8.1 g/dL   Albumin 3.6 3.5 - 5.0 g/dL   AST 20 15 - 41 U/L   ALT 14 0 - 44 U/L   Alkaline Phosphatase 108 38 - 126 U/L   Total Bilirubin 0.4 0.3 - 1.2 mg/dL   GFR, Estimated 8 (L) >60 mL/min    Comment: (NOTE) Calculated using the CKD-EPI Creatinine Equation (2021)    Anion gap 10 5 - 15    Comment: Performed at [facility] [address] [place], [place] [address]  CBC     Status: Abnormal   Collection Time: 09/25/21  3:34 PM  Result Value Ref Range   WBC 5.8 4.0 - 10.5 K/uL   RBC 2.47 (L) 3.87 - 5.11 MIL/uL   Hemoglobin 7.6 (L) 12.0 - 15.0 g/dL   HCT 24.5 (L) 36.0 - 46.0 %   MCV 99.2 80.0 - 100.0 fL   MCH 30.8 26.0 - 34.0 pg   MCHC 31.0 30.0 - 36.0 g/dL   RDW 14.9 11.5 - 15.5 %   Platelets 204 150 - 400 K/uL   nRBC 0.0 0.0 - 0.2 %    Comment: Performed at [facility], [place] [address], [place], [place] [address]  Protime-INR     Status: None   Collection Time: 09/25/21  4:54 PM  Result Value Ref Range   Prothrombin Time 14.8 11.4 - 15.2 seconds   INR 1.2 0.8 - 1.2    Comment: (NOTE) INR goal varies based on device and disease states. Performed at [facility], [place] [address], [place], [place] [address]   Prepare RBC (crossmatch)     Status: None   Collection Time: 09/25/21  5:32 PM  Result Value Ref Range   Order Confirmation      ORDER PROCESSED BY BLOOD BANK Performed at [facility], [place] [address] [place], [place] [address]    DG Chest 2 View  Result Date: 09/25/2021 CLINICAL DATA:  Shortness of breath. EXAM: CHEST - 2 VIEW COMPARISON:  Chest x-ray 09/05/2021 FINDINGS: The heart size and mediastinal contours are within normal limits. Both lungs are clear. No acute fractures. Lumbar fusion hardware is present. Vascular stent in the right upper arm. IMPRESSION: No active cardiopulmonary disease. Electronically Signed   By: Ronney Asters M.D.   On: 09/25/2021 16:01    Pending Labs Unresulted Labs (From admission, onward)    None       Vitals/Pain Today's Vitals   09/25/21 1900 09/25/21 2015 09/25/21 2030 09/25/21 2045  BP: 117/61 (!) 146/65 (!) 143/65 (!) 154/62  Pulse: 88 90 88 90  Resp: (!) 24 (!) 22 14 (!)  21  Temp:      TempSrc:      SpO2: 100% 100% 99% 98%  PainSc:        Isolation Precautions No active isolations  Medications Medications   metoCLOPramide (REGLAN) injection 10 mg (10 mg Intravenous Given 09/25/21 1804)    Followed by  metoCLOPramide (REGLAN) injection 10 mg (has no administration in time range)  peg 3350 powder (MOVIPREP) kit 100 g (100 g Oral Given 09/25/21 1955)    And  peg 3350 powder (MOVIPREP) kit 100 g (has no administration in time range)  bisacodyl (DULCOLAX) EC tablet 20 mg (20 mg Oral Given 09/25/21 1659)  0.9 %  sodium chloride infusion (10 mL/hr Intravenous New Bag/Given 09/25/21 1824)    Mobility walks with device High fall risk      R Recommendations: See Admitting Provider Note  Report given to:   Additional Notes:

## 2021-09-25 NOTE — ED Provider Triage Note (Signed)
Emergency Medicine Provider Triage Evaluation Note ? ?Jane [name] , a 76 y.o. female  was evaluated in triage.  Pt complains of weakness, syncope, dark tarry stools, and bright red blood per rectum.  Patient is being followed by GI, has colonoscopy planned.  She had a decrease in her hemoglobin from 9 something to 7.7 today over the last week.  Patient had previously been on warfarin secondary to A-fib, but was discontinued due to falls.  She reports that her A-fib is paroxysmal.  She is also dialysis patient secondary to hypertension reports that she completed dialysis earlier this morning.  Has not missed any recent appointments. She also endorses some SHOB without cough, fever, chest pain. ? ?Review of Systems  ?Positive: Shob, syncope, BRBPR and melena ?Negative: Chest pain, fever, chills ? ?Physical Exam  ?BP (!) 155/54 (BP Location: Left Arm)   Pulse 95   Temp 98.4 ?F (36.9 ?C) (Oral)   Resp 16   SpO2 100%  ?Gen:   Awake, no distress   ?Resp:  Normal effort  ?MSK:   Moves extremities without difficulty  ?Other:  AV fistula with good thrill, no significant abdominal pain to palpation ? ?Medical Decision Making  ?Medically screening exam initiated at 3:34 PM.  Appropriate orders placed.  Hollee Fate Fassler was informed that the remainder of the evaluation will be completed by another provider, this initial triage assessment does not replace that evaluation, and the importance of remaining in the ED until their evaluation is complete. ? ?Workup initiated ?  ?Anselmo Pickler, PA-C ?09/25/21 1537 ? ?

## 2021-09-26 ENCOUNTER — Inpatient Hospital Stay (HOSPITAL_COMMUNITY): Payer: Medicare Other

## 2021-09-26 ENCOUNTER — Inpatient Hospital Stay (HOSPITAL_COMMUNITY): Payer: Medicare Other | Admitting: Anesthesiology

## 2021-09-26 ENCOUNTER — Other Ambulatory Visit: Payer: Self-pay

## 2021-09-26 ENCOUNTER — Encounter (HOSPITAL_COMMUNITY): Payer: Self-pay | Admitting: Family Medicine

## 2021-09-26 ENCOUNTER — Encounter (HOSPITAL_COMMUNITY)
Admission: EM | Disposition: A | Discharge status: Home / Self Care | Payer: Self-pay | Source: Home / Self Care | Attending: Internal Medicine

## 2021-09-26 ENCOUNTER — Ambulatory Visit: Payer: Medicare Other

## 2021-09-26 DIAGNOSIS — K2970 Gastritis, unspecified, without bleeding: Secondary | ICD-10-CM

## 2021-09-26 DIAGNOSIS — K635 Polyp of colon: Secondary | ICD-10-CM

## 2021-09-26 DIAGNOSIS — I4891 Unspecified atrial fibrillation: Secondary | ICD-10-CM

## 2021-09-26 DIAGNOSIS — K625 Hemorrhage of anus and rectum: Secondary | ICD-10-CM

## 2021-09-26 DIAGNOSIS — K3189 Other diseases of stomach and duodenum: Secondary | ICD-10-CM

## 2021-09-26 DIAGNOSIS — K317 Polyp of stomach and duodenum: Secondary | ICD-10-CM

## 2021-09-26 DIAGNOSIS — G4730 Sleep apnea, unspecified: Secondary | ICD-10-CM

## 2021-09-26 DIAGNOSIS — K921 Melena: Secondary | ICD-10-CM

## 2021-09-26 DIAGNOSIS — D649 Anemia, unspecified: Secondary | ICD-10-CM

## 2021-09-26 DIAGNOSIS — K922 Gastrointestinal hemorrhage, unspecified: Secondary | ICD-10-CM | POA: Diagnosis not present

## 2021-09-26 HISTORY — PX: BIOPSY: SHX5522

## 2021-09-26 HISTORY — PX: POLYPECTOMY: SHX5525

## 2021-09-26 HISTORY — PX: ENTEROSCOPY: SHX5533

## 2021-09-26 HISTORY — PX: COLONOSCOPY WITH PROPOFOL: SHX5780

## 2021-09-26 LAB — CBC
HCT: 25 % — ABNORMAL LOW (ref 36.0–46.0)
Hemoglobin: 7.9 g/dL — ABNORMAL LOW (ref 12.0–15.0)
MCH: 30 pg (ref 26.0–34.0)
MCHC: 31.6 g/dL (ref 30.0–36.0)
MCV: 95.1 fL (ref 80.0–100.0)
Platelets: 179 10*3/uL (ref 150–400)
RBC: 2.63 MIL/uL — ABNORMAL LOW (ref 3.87–5.11)
RDW: 16.8 % — ABNORMAL HIGH (ref 11.5–15.5)
WBC: 6.3 10*3/uL (ref 4.0–10.5)
nRBC: 0 % (ref 0.0–0.2)

## 2021-09-26 LAB — HEMOGLOBIN AND HEMATOCRIT, BLOOD
HCT: 30 % — ABNORMAL LOW (ref 36.0–46.0)
Hemoglobin: 9 g/dL — ABNORMAL LOW (ref 12.0–15.0)

## 2021-09-26 LAB — BASIC METABOLIC PANEL
Anion gap: 13 (ref 5–15)
BUN: 25 mg/dL — ABNORMAL HIGH (ref 8–23)
CO2: 23 mmol/L (ref 22–32)
Calcium: 9.5 mg/dL (ref 8.9–10.3)
Chloride: 105 mmol/L (ref 98–111)
Creatinine, Ser: 7.03 mg/dL — ABNORMAL HIGH (ref 0.44–1.00)
GFR, Estimated: 6 mL/min — ABNORMAL LOW (ref >60)
Glucose, Bld: 89 mg/dL (ref 70–99)
Potassium: 4.2 mmol/L (ref 3.5–5.1)
Sodium: 141 mmol/L (ref 135–145)

## 2021-09-26 LAB — HEMOGLOBIN: Hemoglobin: 8 g/dL — ABNORMAL LOW (ref 12.0–15.0)

## 2021-09-26 SURGERY — ENTEROSCOPY
Anesthesia: Monitor Anesthesia Care

## 2021-09-26 MED ORDER — PHENYLEPHRINE HCL-NACL 20-0.9 MG/250ML-% IV SOLN
INTRAVENOUS | Status: DC | PRN
  Administered 2021-09-26: 25 ug/min via INTRAVENOUS

## 2021-09-26 MED ORDER — PROPOFOL 500 MG/50ML IV EMUL
INTRAVENOUS | Status: DC | PRN
Start: 2021-09-26 — End: 2021-09-26
  Administered 2021-09-26: 150 ug/kg/min via INTRAVENOUS

## 2021-09-26 MED ORDER — LIDOCAINE 2% (20 MG/ML) 5 ML SYRINGE
INTRAMUSCULAR | Status: DC | PRN
  Administered 2021-09-26 (×2): 50 mg via INTRAVENOUS

## 2021-09-26 MED ORDER — PANTOPRAZOLE SODIUM 40 MG IV SOLR: 40.0000 mg | INTRAVENOUS | Status: DC

## 2021-09-26 MED ORDER — PROPOFOL 10 MG/ML IV BOLUS
INTRAVENOUS | Status: DC | PRN
  Administered 2021-09-26 (×3): 20 mg via INTRAVENOUS
  Administered 2021-09-26: 50 mg via INTRAVENOUS

## 2021-09-26 MED ORDER — SODIUM CHLORIDE 0.9 % IV SOLN: INTRAVENOUS | Status: DC | PRN

## 2021-09-26 SURGICAL SUPPLY — 22 items

## 2021-09-26 NOTE — Assessment & Plan Note (Addendum)
Patient presenting to ED with 4-week history of melena and hemoglobin now dropped 2 g to 7.7.  INR 1.2.  History of recurrent GI bleeds in the past complicated by use of Coumadin outpatient.  Coumadin has now been on hold since 3/3. --[facility] following, appreciate assistance --Hgb 7.7>>9.0>>7.1>>9.5>10.2>9.2>9.1 --s/p 2u pRBC on 3/8 and 3/11 --Colonoscopy 3/9: Blood throughout colon, no source identified, nonbleeding external/internal hemorrhoids, small polyp sigmoid colon --Small bowel enteroscopy 3/9: Normal esophagus, multiple gastric polyps with 1 polyp evidence for recent bleeding s/p resection, nodular mucosa to duodenum s/p biopsy, no evidence for recent significant GI bleeding. --CTA GI Bleed 3/10: No evidence of active GI bleeding --Colonoscopy 3/12: Noted end to side colocolonic anastomosis, 2 small polyps sigmoid colon and ascending colon, fresh blood and clots throughout entire examined colon and distalmost 10 cm terminal ileum. --Capsule endoscopy 3/13: Evidence of active oozing right colon or possible terminal ileum --GI plans repeat colonoscopy tomorrow --Clear liquid diet, bowel prep --NPO after MN

## 2021-09-26 NOTE — Progress Notes (Signed)
?  Transition of Care (TOC) Screening Note ? ? ?Patient Details  ?Name: Jane [name] ?Date of Birth: January 16, 1946 ? ? ?Transition of Care (TOC) CM/SW Contact:    ?Cyndi Bender, RN ?Phone Number: ?09/26/2021, 9:10 AM ? ? ? ?Transition of Care Department [facility]) has reviewed patient and no TOC needs have been identified at this time. We will continue to monitor patient advancement through interdisciplinary progression rounds. If new patient transition needs arise, please place a TOC consult. ? ? ?

## 2021-09-26 NOTE — Transfer of Care (Signed)
Immediate Anesthesia Transfer of Care Note ? ?Patient: Jane [name] ? ?Procedure(s) Performed: ENTEROSCOPY ?COLONOSCOPY WITH PROPOFOL ?BIOPSY ?POLYPECTOMY ? ?Patient Location: PACU ? ?Anesthesia Type:MAC ? ?Level of Consciousness: awake, alert  and oriented ? ?Airway & Oxygen Therapy: Patient Spontanous Breathing and Patient connected to nasal cannula oxygen ? ?Post-op Assessment: Report given to RN and Post -op Vital signs reviewed and stable ? ?Post vital signs: Reviewed and stable ? ?Last Vitals:  ?Vitals Value Taken Time  ?BP 138/67 09/26/21 1532  ?Temp 36.7 ?C 09/26/21 1532  ?Pulse 94 09/26/21 1532  ?Resp 15 09/26/21 1532  ?SpO2 100 % 09/26/21 1532  ?Vitals shown include unvalidated device data. ? ?Last Pain:  ?Vitals:  ? 09/26/21 1532  ?TempSrc: Temporal  ?PainSc: 0-No pain  ?   ? ?  ? ?Complications: No notable events documented. ?

## 2021-09-26 NOTE — Op Note (Signed)
[facility] ?Patient Name: Jane [name] ?Procedure Date : 09/26/2021 ?MRN: [id number] ?Attending MD: Thornton Park MD, MD ?Date of Birth: Oct 20, 1945 ?CSN: [id number] ?Age: 76 ?Admit Type: Outpatient ?Procedure:                Small bowel enteroscopy ?Indications:              Hematochezia, Melena ?Providers:                Thornton Park MD, MD, Ervin Knack, RN, Charlean Merl  ?                          Purcell Nails, Technician ?Referring MD:              ?Medicines:                 ?Complications:            No immediate complications. Estimated blood loss:  ?                          Minimal. ?Estimated Blood Loss:     Estimated blood loss was minimal. ?Procedure:                Pre-Anesthesia Assessment: ?                          - Prior to the procedure, a History and Physical  ?                          was performed, and patient medications and  ?                          allergies were reviewed. The patient's tolerance of  ?                          previous anesthesia was also reviewed. The risks  ?                          and benefits of the procedure and the sedation  ?                          options and risks were discussed with the patient.  ?                          All questions were answered, and informed consent  ?                          was obtained. Prior Anticoagulants: The patient has  ?                          taken Coumadin (warfarin), last dose was 6 days  ?                          prior to procedure. ASA Grade Assessment: III - A  ?                          patient  with severe systemic disease. After  ?                          reviewing the risks and benefits, the patient was  ?                          deemed in satisfactory condition to undergo the  ?                          procedure. ?                          After obtaining informed consent, the endoscope was  ?                          passed under direct vision. Throughout the  ?                          procedure,  the patient's blood pressure, pulse, and  ?                          oxygen saturations were monitored continuously. The  ?                          [id number]-Q180 ([id number] Olympus enteroscope was  ?                          introduced through the mouth and advanced to the  ?                          jejunum, to the 180 cm mark (from the incisors).  ?                          The small bowel enteroscopy was accomplished  ?                          without difficulty. The patient tolerated the  ?                          procedure well. ?Scope In: ?Scope Out: ?Findings: ?     The examined esophagus was normal. ?     Multiple small sessile polyps were present in the gastric body. There  ?     were multiple polyps in gastric body and fundus. One polyp had evidence  ?     for recent bleeding and this was removed with a cold snare. The other  ?     polyps were not removed. Resection and retrieval were complete.  ?     Estimated blood loss was minimal. ?     Localized moderate mucosal changes characterized by discoloration were  ?     found in the gastric antrum. Biopsies were taken with a cold forceps for  ?     histology. Estimated blood loss was minimal. ?     Localized nodular mucosa was found in the second portion of the  ?     duodenum. Biopsies were taken with a  cold forceps for histology.  ?     Estimated blood loss was minimal. ?     There was no evidence of significant pathology in the entire examined  ?     portion of jejunum. ?Impression:               - Normal esophagus. ?                          - Multiple gastric polyps. One polyp with evidence  ?                          for recent bleeding was resected and retrieved. ?                          - Discolored mucosa in the antrum. Biopsied. ?                          - Nodular mucosa in the second portion of the  ?                          duodenum. Likely to be related to prior adenoma  ?                          resection. Biopsied to exclude recurrent  adenoma. ?                          - No evidence for recent significant GI bleeding. ?                          - The examined portion of the jejunum was normal. ?Recommendation:           - Proceed with colonoscopy to evaluate for the  ?                          source of bleeding. ?Procedure Code(s):        --- Professional --- ?                          [phone], Small intestinal endoscopy, enteroscopy  ?                          beyond second portion of duodenum, not including  ?                          ileum; with removal of tumor(s), polyp(s), or other  ?                          lesion(s) by snare technique ?                          66599, 59,51, Small intestinal endoscopy,  ?                          enteroscopy beyond second portion of duodenum, not  ?  including ileum; with biopsy, single or multiple ?Diagnosis Code(s):        --- Professional --- ?                          K31.7, Polyp of stomach and duodenum ?                          K31.89, Other diseases of stomach and duodenum ?                          K92.1, Melena (includes Hematochezia) ?CPT copyright 2019 [employer]. All rights reserved. ?The codes documented in this report are preliminary and upon coder review may  ?be revised to meet current compliance requirements. ?Thornton Park MD, MD ?09/26/2021 3:38:14 PM ?This report has been signed electronically. ?Number of Addenda: 0 ?

## 2021-09-26 NOTE — H&P (View-Only) (Signed)
? ? Progress Note ? ? Subjective  ?Chief Complaint: Anemia and GI bleeding ? ?Patient doing well today, tells me she was able to finish all of the bowel prep as of early this morning, she has been having just straight bright red bloody bowel movements the past 3 occasions, the last around 845.  Tells me she is still having some lower abdominal cramping and feels like she may need to go again.  She is prepared for procedures later today. ? ? Objective  ? ?Vital signs in last 24 hours: ?Temp:  [97.5 ?F (36.4 ?C)-98.4 ?F (36.9 ?C)] 97.5 ?F (36.4 ?C) (03/09 8315) ?Pulse Rate:  [87-99] 97 (03/09 0836) ?Resp:  [12-24] 18 (03/09 0836) ?BP: (117-155)/(44-101) 137/57 (03/09 0836) ?SpO2:  [90 %-100 %] 90 % (03/09 0836) ?Last BM Date : 09/26/21 ?General:    AA female in NAD ?Heart:  Regular rate and rhythm; no murmurs ?Lungs: Respirations even and unlabored, lungs CTA bilaterally ?Abdomen:  Soft, nontender and nondistended. Normal bowel sounds. ?Psych:  Cooperative. Normal mood and affect. ? ?Intake/Output from previous day: ?03/08 0701 - 03/09 0700 ?In: 389 [I.V.:75; Blood:314] ?Out: 750 [Stool:750] ? ? ?Lab Results: ?Recent Labs  ?  09/25/21 ?1151 09/25/21 ?1534 09/25/21 ?2306 09/26/21 ?1761  ?WBC 6.1 5.8  --  6.3  ?HGB 7.7 Repeated and verified X2.* 7.6* 8.7* 7.9*  ?HCT 23.6 Repeated and verified X2.* 24.5* 28.7* 25.0*  ?PLT 201.0 204  --  179  ? ?BMET ?Recent Labs  ?  09/25/21 ?1534 09/26/21 ?0208  ?NA 138 141  ?K 3.8 4.2  ?CL 100 105  ?CO2 28 23  ?GLUCOSE 117* 89  ?BUN 17 25*  ?CREATININE 5.42* 7.03*  ?CALCIUM 9.9 9.5  ? ?LFT ?Recent Labs  ?  09/25/21 ?1534  ?PROT 7.5  ?ALBUMIN 3.6  ?AST 20  ?ALT 14  ?ALKPHOS 108  ?BILITOT 0.4  ? ?PT/INR ?Recent Labs  ?  09/24/21 ?1023 09/25/21 ?1654  ?LABPROT  --  14.8  ?INR 1.9* 1.2  ? ? ?Studies/Results: ?DG Chest 2 View ? ?Result Date: 09/25/2021 ?CLINICAL DATA:  Shortness of breath. EXAM: CHEST - 2 VIEW COMPARISON:  Chest x-ray 09/05/2021 FINDINGS: The heart size and mediastinal  contours are within normal limits. Both lungs are clear. No acute fractures. Lumbar fusion hardware is present. Vascular stent in the right upper arm. IMPRESSION: No active cardiopulmonary disease. Electronically Signed   By: Ronney Asters M.D.   On: 09/25/2021 16:01   ? ? Assessment / Plan:   ?Assessment: ?1.  Melena and bright red blood per rectum: Was on Coumadin stopped 3/3, last episode of bright red blood was around 845 this morning after drinking bowel prep, last hemoglobin drawn at 2:00 this morning, will repeat now, patient scheduled for EGD and enteroscopy and colonoscopy this afternoon at 130 ?2.  Progressive normocytic anemia ?3.  A-fib: On chronic Coumadin which has been held since 3/3, INR 1.2 today ?4.  ESRD: On hemodialysis Monday Wednesday Friday ?5.  Chronic constipation: Typically on Linzess ? ?Plan: ?1.  Repeat hemoglobin now, if it is less than 7 patient will need 1 unit PRBCs ordered prior to colonoscopy ?2.  Patient is scheduled for enteroscopy and colonoscopy at 130 with Dr. Tarri Glenn.  Patient did not have any questions in regards to this today. ?3.  Patient be n.p.o. until after time procedure. ?4.  Please await further recommendations from Dr. Tarri Glenn after time procedures. ? ?Thank for kind consultation, we will continue to follow ? ? ?  LOS: 1 day  ? ?Lavone Nian Naethan Bracewell  09/26/2021, 10:13 AM ? ?  ?

## 2021-09-26 NOTE — Assessment & Plan Note (Addendum)
Coumadin has been held since 3/3.  Continue to hold.  INR is subtherapeutic at 1.2.  Patient has followed with cardiology outpatient and anticoagulation has been held indefinitely with referral for potential Watchman device

## 2021-09-26 NOTE — Anesthesia Preprocedure Evaluation (Addendum)
Anesthesia Evaluation  ?Patient identified by MRN, date of birth, ID band ?Patient awake ? ? ? ?Reviewed: ?Allergy & Precautions, NPO status , Patient's Chart, lab work & pertinent test results ? ?Airway ?Mallampati: II ? ?TM Distance: >3 FB ?Neck ROM: Full ? ? ? Dental ?no notable dental hx. ? ?  ?Pulmonary ?neg pulmonary ROS, asthma , sleep apnea ,  ?  ?Pulmonary exam normal ?breath sounds clear to auscultation ? ? ? ? ? ? Cardiovascular ?hypertension, Pt. on medications ?negative cardio ROS ?Normal cardiovascular exam+ dysrhythmias Atrial Fibrillation  ?Rhythm:Regular Rate:Normal ? ?TTE 2022 ?Normal EF, valves ok ?  ?Neuro/Psych ? Headaches, PSYCHIATRIC DISORDERS Depression negative neurological ROS ? negative psych ROS  ? GI/Hepatic ?negative GI ROS, Neg liver ROS, PUD, GERD  ,  ?Endo/Other  ?negative endocrine ROS ? Renal/GU ?Dialysis and ESRFRenal diseasenegative Renal ROS  ?negative genitourinary ?  ?Musculoskeletal ?negative musculoskeletal ROS ?(+) Arthritis , Osteoarthritis,   ? Abdominal ?  ?Peds ?negative pediatric ROS ?(+)  Hematology ?negative hematology ROS ?(+) Blood dyscrasia (on coumadin), anemia , Lab Results ?     Component                Value               Date                 ?     WBC                      6.3                 09/26/2021           ?     HGB                      7.9 (L)             09/26/2021           ?     HCT                      25.0 (L)            09/26/2021           ?     MCV                      95.1                09/26/2021           ?     PLT                      179                 09/26/2021           ?   ?Anesthesia Other Findings ? ? Reproductive/Obstetrics ?negative OB ROS ? ?  ? ? ? ? ? ? ? ? ? ? ? ? ? ?  ?  ? ? ? ? ? ? ? ?Anesthesia Physical ?Anesthesia Plan ? ?ASA: 3 ? ?Anesthesia Plan: MAC  ? ?Post-op Pain Management:   ? ?Induction: Intravenous ? ?PONV Risk Score and Plan: 2 and Propofol infusion and Treatment may vary due  to age or medical condition ? ?Airway Management Planned: Natural Airway ? ?Additional Equipment:  ? ?Intra-op Plan:  ? ?  Post-operative Plan:  ? ?Informed Consent: I have reviewed the patients History and Physical, chart, labs and discussed the procedure including the risks, benefits and alternatives for the proposed anesthesia with the patient or authorized representative who has indicated his/her understanding and acceptance.  ? ? ? ?Dental advisory given ? ?Plan Discussed with: CRNA ? ?Anesthesia Plan Comments:   ? ? ? ? ? ?Anesthesia Quick Evaluation ? ?

## 2021-09-26 NOTE — H&P (Addendum)
History and Physical    Patient: Jane [name] QMG:[id number] DOB: 1945/10/22 DOA: 09/25/2021 DOS: the patient was seen and examined on 09/26/2021 PCP: [name], [name], MD  Patient coming from: Home  Chief Complaint:  Chief Complaint  Patient presents with   Loss of Consciousness   HPI: Jane [name] is a 76 y.o. female with medical history significant of hx of sigmoid colectomy secondary to perforated diverticulitis 2017, ESRD on HD MWF, paroxysmal atrial fibrillation on Coumadin, history of GI bleed, OSA not on CPAP who presents for concerns of several weeks of melena and drop in hemoglobin.  Has been noticing melena for the past 4 weeks that has progressively become more bright red blood. Episodes only happen with bowel movement. Has bowel movement several times a day since she is on Linzess.  Has abdominal cramping.  Also becoming more hypotension with dizziness during each dialysis session in the past 2 weeks.  Has held Coumadin since 3/3 due to supratherapeutic INR.  Also saw cardiology on 3/7 and had Coumadin held indefinitely due to recurrent GI bleed. Now has referral for potential Watchman device.  She had been following with GI and had repeat colonoscopy planned on 3/14. However noted on repeat CBC today to have 2g drop in Hgb over a week's time and was sent to ED.   Review of Systems: As mentioned in the history of present illness. All other systems reviewed and are negative. Past Medical History:  Diagnosis Date   Acid reflux    Anemia of chronic disease    Arthritis    Asthma    Atrial fibrillation ([place])    Bilateral carotid bruits    Complication of anesthesia    "hard to wake up, I have sleep apnea" no CPAP   Diverticulitis    Duodenal ulcer    Dysrhythmia    Afib   ESRD (end stage renal disease) ([place])    MWF [place]   Headache    Heart murmur    History of blood transfusion    Hypertension    Malaise and fatigue    Orthostatic hypotension    Shortness of  breath    " when I walk to fast"   Sleep apnea    Syncope    Tubulovillous adenoma of colon    Past Surgical History:  Procedure Laterality Date   A/V FISTULAGRAM Right 10/18/2020   Procedure: A/V FISTULAGRAM;  Surgeon: Marty Heck, MD;  Location: [facility];  Service: Cardiovascular;  Laterality: Right;   A/V FISTULAGRAM Right 09/05/2021   Procedure: A/V Fistulagram;  Surgeon: Marty Heck, MD;  Location: [place];  Service: Cardiovascular;  Laterality: Right;   AV FISTULA PLACEMENT     BACK SURGERY     Lumbar fusion L 4 and L 5   BIOPSY  01/09/2020   Procedure: BIOPSY;  Surgeon: Rush Landmark Telford Nab., MD;  Location: [place];  Service: Gastroenterology;;   BIOPSY  01/31/2021   Procedure: BIOPSY;  Surgeon: Irving Copas., MD;  Location: [place];  Service: Gastroenterology;;   COLONOSCOPY     COLONOSCOPY N/A 02/01/2021   Procedure: COLONOSCOPY;  Surgeon: Yetta Flock, MD;  Location: [place];  Service: Gastroenterology;  Laterality: N/A;   ENDOSCOPIC MUCOSAL RESECTION N/A 01/09/2020   Procedure: ENDOSCOPIC MUCOSAL RESECTION;  Surgeon: Rush Landmark Telford Nab., MD;  Location: [place];  Service: Gastroenterology;  Laterality: N/A;   ENDOSCOPIC MUCOSAL RESECTION N/A 01/31/2021   Procedure: ENDOSCOPIC MUCOSAL  RESECTION;  Surgeon: Rush Landmark Telford Nab., MD;  Location: [place];  Service: Gastroenterology;  Laterality: N/A;   ESOPHAGOGASTRODUODENOSCOPY     ESOPHAGOGASTRODUODENOSCOPY (EGD) WITH PROPOFOL N/A 01/09/2020   Procedure: ESOPHAGOGASTRODUODENOSCOPY (EGD) WITH PROPOFOL;  Surgeon: Rush Landmark Telford Nab., MD;  Location: [place];  Service: Gastroenterology;  Laterality: N/A;   ESOPHAGOGASTRODUODENOSCOPY (EGD) WITH PROPOFOL N/A 01/13/2020   Procedure: ESOPHAGOGASTRODUODENOSCOPY (EGD) WITH PROPOFOL;  Surgeon: Irene Shipper, MD;  Location: [place];  Service: Gastroenterology;  Laterality: N/A;    ESOPHAGOGASTRODUODENOSCOPY (EGD) WITH PROPOFOL N/A 01/31/2021   Procedure: ESOPHAGOGASTRODUODENOSCOPY (EGD) WITH PROPOFOL;  Surgeon: Rush Landmark Telford Nab., MD;  Location: [place];  Service: Gastroenterology;  Laterality: N/A;   EUS N/A 01/09/2020   Procedure: UPPER ENDOSCOPIC ULTRASOUND (EUS) RADIAL;  Surgeon: Irving Copas., MD;  Location: [place];  Service: Gastroenterology;  Laterality: N/A;   HEMOSTASIS CLIP PLACEMENT  01/09/2020   Procedure: HEMOSTASIS CLIP PLACEMENT;  Surgeon: Irving Copas., MD;  Location: [place];  Service: Gastroenterology;;   HEMOSTASIS CLIP PLACEMENT  01/31/2021   Procedure: HEMOSTASIS CLIP PLACEMENT;  Surgeon: Irving Copas., MD;  Location: [place];  Service: Gastroenterology;;   HEMOSTASIS CONTROL  01/13/2020   Procedure: HEMOSTASIS CONTROL;  Surgeon: Irene Shipper, MD;  Location: [facility];  Service: Gastroenterology;;  hemaspray   HOT HEMOSTASIS N/A 01/13/2020   Procedure: HOT HEMOSTASIS (ARGON PLASMA COAGULATION/BICAP);  Surgeon: Irene Shipper, MD;  Location: [place];  Service: Gastroenterology;  Laterality: N/A;   HOT HEMOSTASIS N/A 02/01/2021   Procedure: HOT HEMOSTASIS (ARGON PLASMA COAGULATION/BICAP);  Surgeon: Yetta Flock, MD;  Location[facility] END[facility]COPY;  Service: Gastroenterology;  Laterality: N/A;   IR GENERIC HISTORICAL  07/09/2016   IR US GUIDE VASC ACCESS RIGHT 07/09/2016 Arne Cleveland, MD [facility]   IR GENERIC HISTORICAL  07/09/2016   IR FLUORO GUIDE CV LINE RIGHT 07/09/2016 Arne Cleveland, MD [facility]   KNEE ARTHROPLASTY Left    LAPAROSCOPIC SIGMOID COLECTOMY N/A 07/11/2016   Procedure: LAPAROSCOPIC SIGMOID COLECTOMY;  Surgeon: Clovis Riley, MD;  Location: [place];  Service: General;  Laterality: N/A;   MASS EXCISION Right 04/10/2020   Procedure: EXCISION SKIN NODULE RIGHT FOREARM;  Surgeon: Waynetta Sandy, MD;  Location: [place];  Service: Vascular;  Laterality: Right;    SCLEROTHERAPY  01/09/2020   Procedure: Clide Deutscher;  Surgeon: Mansouraty, Telford Nab., MD;  Location: [place];  Service: Gastroenterology;;   Clide Deutscher  01/13/2020   Procedure: Clide Deutscher;  Surgeon: Irene Shipper, MD;  Location: [facility] ENDOSCOPY;  Service: Gastroenterology;;   Maryagnes Amos INJECTION  01/09/2020   Procedure: SUBMUCOSAL LIFTING INJECTION;  Surgeon: Irving Copas., MD;  Location: [place];  Service: Gastroenterology;;   TUBAL LIGATION     Social History:  reports that she has never smoked. She has never been exposed to tobacco smoke. She has never used smokeless tobacco. She reports that she does not drink alcohol and does not use drugs.  Allergies  Allergen Reactions   Latex Rash   Penicillins Other (See Comments)    Yeast infection / Childhood   Sulfa Antibiotics Rash   Tape Other (See Comments)    Plastic, silicone, and paper tape causes bruising and pulls off skin. Cloth tape works fine    Family History  Problem Relation Age of Onset   Heart failure Mother    Stroke Mother    Other Father    Colon cancer Neg Hx    Liver disease Neg Hx    Esophageal  cancer Neg Hx    Stomach cancer Neg Hx    Inflammatory bowel disease Neg Hx    Rectal cancer Neg Hx    Pancreatic cancer Neg Hx     Prior to Admission medications   Medication Sig Start Date End Date Taking? Authorizing Provider  atorvastatin (LIPITOR) 10 MG tablet Take 1 tablet (10 mg total) by mouth daily. 08/13/21 11/11/21 Yes Adrian Prows, MD  B Complex-C-Zn-Folic Acid (DIALYVITE 767 WITH ZINC) 0.8 MG TABS Take 1 tablet by mouth in the morning. 07/19/20  Yes [provider]  CALCITRIOL PO Take 2 tablets by mouth every Monday, Wednesday, and Friday with hemodialysis. 04/05/21 04/04/22 Yes [provider]  ethyl chloride spray Apply 1 application topically every Monday, Wednesday, and Friday with hemodialysis. 12/25/17  Yes [provider]  linaclotide (LINZESS) 145  MCG CAPS capsule TAKE 1 CAPSULE BY MOUTH ONCE DAILY BEFORE BREAKFAST Patient taking differently: Take 145 mcg by mouth daily before breakfast. 08/13/21  Yes Pyrtle, Lajuan Lines, MD  Methoxy PEG-Epoetin Beta (MIRCERA IJ) Inject 1 Dose into the vein every Monday, Wednesday, and Friday. 08/21/21 08/20/22 Yes [provider]  midodrine (PROAMATINE) 10 MG tablet Take 1 tablet (10 mg total) by mouth 3 (three) times daily as needed (low BP and dizziness and lightheadedness symptoms.). Patient taking differently: Take 10 mg by mouth 3 (three) times daily as needed (low BP and dizziness and lightheadedness symptoms. SBP <130 mmHg). 09/17/21  Yes Adrian Prows, MD  pantoprazole (PROTONIX) 40 MG tablet Take 1 tablet (40 mg total) by mouth 2 (two) times daily. 08/13/21  Yes Pyrtle, Lajuan Lines, MD  sevelamer carbonate (RENVELA) 800 MG tablet Take 800 mg by mouth in the morning and at bedtime. With meals 08/26/21  Yes [provider]  warfarin (COUMADIN) 5 MG tablet Take 1 tablet (5 mg total) by mouth daily. Or as directed by coumadin clinic. Patient taking differently: Take 5 mg by mouth daily. 08/21/21  Yes Adrian Prows, MD    Physical Exam: Vitals:   09/25/21 2030 09/25/21 2045 09/25/21 2130 09/26/21 0000  BP: (!) 143/65 (!) 154/62 (!) 153/63 (!) 126/44  Pulse: 88 90 87 88  Resp: 14 (!) '21 20 19  '$ Temp:   97.6 F (36.4 C) 97.9 F (36.6 C)  TempSrc:   Oral Oral  SpO2: 99% 98% 98% 98%   Constitutional: NAD, calm, comfortable, nontoxic-appearing female sitting upright in bed Eyes: lids and conjunctivae normal ENMT: Mucous membranes are moist.  Neck: normal, supple Respiratory: clear to auscultation bilaterally, no wheezing, no crackles. Normal respiratory effort. No accessory muscle use.  Cardiovascular: Regular rate and rhythm, no murmurs / rubs / gallops. No extremity edema.  Right forearm AV fistula Abdomen: no tenderness, no masses palpated.  Bowel sounds positive.  Musculoskeletal: no clubbing /  cyanosis. No joint deformity upper and lower extremities. Good ROM, no contractures. Normal muscle tone.  Skin: no rashes, lesions, ulcers. No induration Neurologic: CN 2-12 grossly intact.  Strength 5/5 in all 4.  Psychiatric: Normal judgment and insight. Alert and oriented x 3. Normal mood. Data Reviewed:  CBC without leukocytosis, downtrending hemoglobin of 7.9 from a prior of 9.6.  BMP otherwise stable with creatinine of 5.42 similar to baseline.  Assessment and Plan: * Acute GI bleeding -Has extensive hx of recurrent GI bleed in the past. Also has hx of sigmoid colectomy from complicated diverticulitis.  She is on Coumadin for paroxysmal atrial fibrillation but this has been held since 3/3.  INR  of 1.2 today. -[facility] has been following and initially plan for outpatient procedure but sent to ED due to rapid decline in hemoglobin. -Hgb of 7.6 from 9.6 a week ago. Transfuse 1u pRBC and check post-H/H. Threshold to transfuse at Hgb <7. -GI to get for colonscopy and EGD with enteroscopy tomorrow. Keep NPO. IV reglan per GI    ESRD on hemodialysis [facility]) -needs nephrology consult for dialysis -creatinine is stable    Paroxysmal atrial fibrillation (HCC) -Coumadin has been held since 3/3.  Continue to hold.  INR is subtherapeutic at 1.2.  Patient has followed with cardiology outpatient and anticoagulation has been held indefinitely with referral for potential Watchman device      Advance Care Planning:   Code Status: Full Code   Consults: nephrology  Family Communication: No family at bedside  Severity of Illness: The appropriate patient status for this patient is INPATIENT. Inpatient status is judged to be reasonable and necessary in order to provide the required intensity of service to ensure the patient's safety. The patient's presenting symptoms, physical exam findings, and initial radiographic and laboratory data in the context of their chronic comorbidities is felt to place  them at high risk for further clinical deterioration. Furthermore, it is not anticipated that the patient will be medically stable for discharge from the hospital within 2 midnights of admission.   * I certify that at the point of admission it is my clinical judgment that the patient will require inpatient hospital care spanning beyond 2 midnights from the point of admission due to high intensity of service, high risk for further deterioration and high frequency of surveillance required.*  Author: Orene Desanctis, DO 09/26/2021 1:24 AM  For on call review [email or web address].

## 2021-09-26 NOTE — Interval H&P Note (Signed)
History and Physical Interval Note: ? ?09/26/2021 ?1:16 PM ? ?Jane [name]  has presented today for surgery, with the diagnosis of Melena, dark and bloody stools, acute on chronic progressive anemia.  The various methods of treatment have been discussed with the patient and family. After consideration of risks, benefits and other options for treatment, the patient has consented to  Procedure(s): ?ENTEROSCOPY (N/A) ?COLONOSCOPY WITH PROPOFOL (N/A) as a surgical intervention.  The patient's history has been reviewed, patient examined, no change in status, stable for surgery.  I have reviewed the patient's chart and labs.  Questions were answered to the patient's satisfaction.   ? ? ?Thornton Park ? ? ?

## 2021-09-26 NOTE — Assessment & Plan Note (Addendum)
--   Nephrology following for continued HD while inpatient

## 2021-09-26 NOTE — Telephone Encounter (Signed)
Appears pt did complete labs in a timely manner and Carl Best, NP addressed results, advising pt to proceed to ED for further evaluation and treatment. ?

## 2021-09-26 NOTE — Progress Notes (Signed)
? ? Progress Note ? ? Subjective  ?Chief Complaint: Anemia and GI bleeding ? ?Patient doing well today, tells me she was able to finish all of the bowel prep as of early this morning, she has been having just straight bright red bloody bowel movements the past 3 occasions, the last around 845.  Tells me she is still having some lower abdominal cramping and feels like she may need to go again.  She is prepared for procedures later today. ? ? Objective  ? ?Vital signs in last 24 hours: ?Temp:  [97.5 ?F (36.4 ?C)-98.4 ?F (36.9 ?C)] 97.5 ?F (36.4 ?C) (03/09 1093) ?Pulse Rate:  [87-99] 97 (03/09 0836) ?Resp:  [12-24] 18 (03/09 0836) ?BP: (117-155)/(44-101) 137/57 (03/09 0836) ?SpO2:  [90 %-100 %] 90 % (03/09 0836) ?Last BM Date : 09/26/21 ?General:    AA female in NAD ?Heart:  Regular rate and rhythm; no murmurs ?Lungs: Respirations even and unlabored, lungs CTA bilaterally ?Abdomen:  Soft, nontender and nondistended. Normal bowel sounds. ?Psych:  Cooperative. Normal mood and affect. ? ?Intake/Output from previous day: ?03/08 0701 - 03/09 0700 ?In: 389 [I.V.:75; Blood:314] ?Out: 750 [Stool:750] ? ? ?Lab Results: ?Recent Labs  ?  09/25/21 ?1151 09/25/21 ?1534 09/25/21 ?2306 09/26/21 ?2355  ?WBC 6.1 5.8  --  6.3  ?HGB 7.7 Repeated and verified X2.* 7.6* 8.7* 7.9*  ?HCT 23.6 Repeated and verified X2.* 24.5* 28.7* 25.0*  ?PLT 201.0 204  --  179  ? ?BMET ?Recent Labs  ?  09/25/21 ?1534 09/26/21 ?0208  ?NA 138 141  ?K 3.8 4.2  ?CL 100 105  ?CO2 28 23  ?GLUCOSE 117* 89  ?BUN 17 25*  ?CREATININE 5.42* 7.03*  ?CALCIUM 9.9 9.5  ? ?LFT ?Recent Labs  ?  09/25/21 ?1534  ?PROT 7.5  ?ALBUMIN 3.6  ?AST 20  ?ALT 14  ?ALKPHOS 108  ?BILITOT 0.4  ? ?PT/INR ?Recent Labs  ?  09/24/21 ?1023 09/25/21 ?1654  ?LABPROT  --  14.8  ?INR 1.9* 1.2  ? ? ?Studies/Results: ?DG Chest 2 View ? ?Result Date: 09/25/2021 ?CLINICAL DATA:  Shortness of breath. EXAM: CHEST - 2 VIEW COMPARISON:  Chest x-ray 09/05/2021 FINDINGS: The heart size and mediastinal  contours are within normal limits. Both lungs are clear. No acute fractures. Lumbar fusion hardware is present. Vascular stent in the right upper arm. IMPRESSION: No active cardiopulmonary disease. Electronically Signed   By: Ronney Asters M.D.   On: 09/25/2021 16:01   ? ? Assessment / Plan:   ?Assessment: ?1.  Melena and bright red blood per rectum: Was on Coumadin stopped 3/3, last episode of bright red blood was around 845 this morning after drinking bowel prep, last hemoglobin drawn at 2:00 this morning, will repeat now, patient scheduled for EGD and enteroscopy and colonoscopy this afternoon at 130 ?2.  Progressive normocytic anemia ?3.  A-fib: On chronic Coumadin which has been held since 3/3, INR 1.2 today ?4.  ESRD: On hemodialysis Monday Wednesday Friday ?5.  Chronic constipation: Typically on Linzess ? ?Plan: ?1.  Repeat hemoglobin now, if it is less than 7 patient will need 1 unit PRBCs ordered prior to colonoscopy ?2.  Patient is scheduled for enteroscopy and colonoscopy at 130 with Dr. Tarri Glenn.  Patient did not have any questions in regards to this today. ?3.  Patient be n.p.o. until after time procedure. ?4.  Please await further recommendations from Dr. Tarri Glenn after time procedures. ? ?Thank for kind consultation, we will continue to follow ? ? ?  LOS: 1 day  ? ?Lavone Nian Klaire Court  09/26/2021, 10:13 AM ? ?  ?

## 2021-09-26 NOTE — Op Note (Signed)
[facility] ?Patient Name: Jane [name] ?Procedure Date : 09/26/2021 ?MRN: [id number] ?Attending MD: Thornton Park MD, MD ?Date of Birth: [birth year] ?CSN: [id number] ?Age: 76 ?Admit Type: Outpatient ?Procedure:                Colonoscopy ?Indications:              Hematochezia ?Providers:                Thornton Park MD, MD, Ervin Knack, RN, Charlean Merl  ?                          Purcell Nails, Technician ?Referring MD:              ?Medicines:                Monitored Anesthesia Care ?Complications:            No immediate complications. ?Estimated Blood Loss:     Estimated blood loss: none. ?Procedure:                Pre-Anesthesia Assessment: ?                          - Prior to the procedure, a History and Physical  ?                          was performed, and patient medications and  ?                          allergies were reviewed. The patient's tolerance of  ?                          previous anesthesia was also reviewed. The risks  ?                          and benefits of the procedure and the sedation  ?                          options and risks were discussed with the patient.  ?                          All questions were answered, and informed consent  ?                          was obtained. Prior Anticoagulants: The patient has  ?                          taken Coumadin (warfarin), last dose was 6 days  ?                          prior to procedure. ASA Grade Assessment: III - A  ?                          patient with severe systemic disease. After  ?                          reviewing  the risks and benefits, the patient was  ?                          deemed in satisfactory condition to undergo the  ?                          procedure. ?                          After obtaining informed consent, the colonoscope  ?                          was passed under direct vision. Throughout the  ?                          procedure, the patient's blood pressure, pulse, and  ?                           oxygen saturations were monitored continuously. The  ?                          [id number] ([id number]) Olympus coloscope was  ?                          introduced through the anus and advanced to the 8  ?                          cm into the ileum. The colonoscopy was performed  ?                          with moderate difficulty due to excessive bleeding.  ?                          The patient tolerated the procedure well. The  ?                          quality of the bowel preparation was poor. The  ?                          terminal ileum, ileocecal valve, appendiceal  ?                          orifice, and rectum were photographed. ?Scope In: 3:04:51 PM ?Scope Out: 3:21:46 PM ?Scope Withdrawal Time: 0 hours 11 minutes 17 seconds  ?Total Procedure Duration: 0 hours 16 minutes 55 seconds  ?Findings: ?     Non-bleeding external and internal hemorrhoids were found. ?     A small polyp was found in the sigmoid colon. The polyp was not removed  ?     given the limited visualization and the active bleeding. ?     A large amount of fresh blood was found in the entire colon, precluding  ?     visualization. No source was identified. ?     There was evidence of a prior end-to-end colo-colonic anastomosis in the  ?     sigmoid colon.  This was patent. The anastomosis was traversed. ?     The terminal ileum appeared normal. No blood present. ?Impression:               - Blood throughout the colon. But, a source was not  ?                          idenfied. ?                          - No blood present the terminal ileum. ?                          - Non-bleeding external and internal hemorrhoids. ?                          - One small polyp in the sigmoid colon. Not  ?                          resected during this procedure. ?Recommendation:           - Return patient to hospital ward for ongoing care. ?                          - CTA +/- angio for further evaluation. ?                          - Continue present  medications. ?                          - Continue serial hgb/hct with transfusion as  ?                          indicated. ?Procedure Code(s):        --- Professional --- ?                          [phone], Colonoscopy, flexible; diagnostic, including  ?                          collection of specimen(s) by brushing or washing,  ?                          when performed (separate procedure) ?Diagnosis Code(s):        --- Professional --- ?                          S28.7, Other hemorrhoids ?                          K63.5, Polyp of colon ?                          K92.1, Melena (includes Hematochezia) ?CPT copyright 2019 [employer]. All rights reserved. ?The codes documented in this report are preliminary and upon coder review may  ?be revised to meet current compliance requirements. ?Thornton Park MD, MD ?09/26/2021 3:49:57 PM ?This report has been signed  electronically. ?Number of Addenda: 0 ?

## 2021-09-26 NOTE — Progress Notes (Signed)
PROGRESS NOTE    Jane [name]  BJY:[id number] DOB: 03-18-1946 DOA: 09/25/2021 PCP: [place], [name], MD    Brief Narrative:  76 year old with history of sigmoid colectomy secondary to perforated diverticulitis in 2017, ESRD on hemodialysis Monday Wednesday Friday, paroxysmal A-fib on Coumadin, history of multiple GI bleeding and vascular malformations, history of sleep apnea not on CPAP presented with several weeks of melena and drop in hemoglobin called by dialysis center after routine blood draw.  She has been noted to have melena for at least 4 weeks now.  Occasionally becoming dizzy after dialysis sessions.  She has been holding her Coumadin since 3/3 secondary to high INR.  Seen by Coumadin clinic and was advised to discontinue Coumadin and she is planned to be referred for Watchman device.  She was scheduled for colonoscopy by GI as outpatient on 3/14, however due to drop in hemoglobin needed to come to the hospital.   Assessment & Plan:   Acute lower GI bleeding, anemia of acute blood loss: With history of extensive recurrent GI bleeding in the past.  Currently off Coumadin.  INR is stable. Hemoglobin 7.6 from 9.6 a week ago.-1 unit PRBC transfusion done-7.8, 8.  Latest hemoglobin 8.  We will continue to monitor every 12 hours.  Transfuse if less than 7.  On IV Reglan. Gastroenterology following, for EGD, enteroscopy and colonoscopy today.  ESRD on hemodialysis: Receive dialysis 3/8.  Will notify nephrology for next dialysis tomorrow.  Paroxysmal A-fib: Currently rate controlled.  Not on any medications.  Cardiology plan to keep off anticoagulation.  They will follow-up.   DVT prophylaxis: SCDs Start: 09/26/21 0102   Code Status: Full code Family Communication: None Disposition Plan: Status is: Inpatient Remains inpatient appropriate because: Inpatient procedure planned.  Ongoing lower GI bleed.     Consultants:  Gastroenterology Nephrology, will notify  Procedures:   Scheduled for endoscopy today  Antimicrobials:  None   Subjective: Patient seen and examined in the morning rounds.  Denied any complaints.  Witnessed patient had small amount of fresh rectal bleeding while using bathroom.  Taking bowel prep.  Looking forward for procedures.  Denies any nausea vomiting.  Objective: Vitals:   09/26/21 0400 09/26/21 0836 09/26/21 1235 09/26/21 1324  BP: (!) 117/50 (!) 137/57 (!) 142/57 (!) 170/63  Pulse: 90 97 89 91  Resp: 20 18 16 14   Temp: 97.6 F (36.4 C) (!) 97.5 F (36.4 C) 98 F (36.7 C) 98 F (36.7 C)  TempSrc: Oral Oral Oral Temporal  SpO2: 97% 90% 97% 100%    Intake/Output Summary (Last 24 hours) at 09/26/2021 1459 Last data filed at 09/26/2021 0300 Gross per 24 hour  Intake 389 ml  Output 750 ml  Net -361 ml   There were no vitals filed for this visit.  Examination:  General exam: Appears calm and comfortable  Respiratory system: No added sounds. Cardiovascular system: No added sounds.  Sinus rhythm.   Gastrointestinal system: Soft.  Nontender.  Bowel sound present. Central nervous system: Alert and oriented. No focal neurological deficits. Extremities: Symmetric 5 x 5 power. Skin: No rashes, lesions or ulcers Psychiatry: Judgement and insight appear normal. Mood & affect appropriate.     Data Reviewed: I have personally reviewed following labs and imaging studies  CBC: Recent Labs  Lab 09/25/21 1151 09/25/21 1534 09/25/21 2306 09/26/21 0208 09/26/21 1020  WBC 6.1 5.8  --  6.3  --   HGB 7.7 Repeated and verified X2.* 7.6* 8.7* 7.9* 8.0*  HCT 23.6 Repeated and verified X2.* 24.5* 28.7* 25.0*  --   MCV 92.6 99.2  --  95.1  --   PLT 201.0 204  --  179  --    Basic Metabolic Panel: Recent Labs  Lab 09/25/21 1534 09/26/21 0208  NA 138 141  K 3.8 4.2  CL 100 105  CO2 28 23  GLUCOSE 117* 89  BUN 17 25*  CREATININE 5.42* 7.03*  CALCIUM 9.9 9.5   GFR: Estimated Creatinine Clearance: 7.2 mL/min (A) (by C-G  formula based on SCr of 7.03 mg/dL (H)). Liver Function Tests: Recent Labs  Lab 09/25/21 1534  AST 20  ALT 14  ALKPHOS 108  BILITOT 0.4  PROT 7.5  ALBUMIN 3.6   No results for input(s): LIPASE, AMYLASE in the last 168 hours. No results for input(s): AMMONIA in the last 168 hours. Coagulation Profile: Recent Labs  Lab 09/24/21 1023 09/25/21 1654  INR 1.9* 1.2   Cardiac Enzymes: No results for input(s): CKTOTAL, CKMB, CKMBINDEX, TROPONINI in the last 168 hours. BNP (last 3 results) No results for input(s): PROBNP in the last 8760 hours. HbA1C: No results for input(s): HGBA1C in the last 72 hours. CBG: No results for input(s): GLUCAP in the last 168 hours. Lipid Profile: No results for input(s): CHOL, HDL, LDLCALC, TRIG, CHOLHDL, LDLDIRECT in the last 72 hours. Thyroid Function Tests: No results for input(s): TSH, T4TOTAL, FREET4, T3FREE, THYROIDAB in the last 72 hours. Anemia Panel: No results for input(s): VITAMINB12, FOLATE, FERRITIN, TIBC, IRON, RETICCTPCT in the last 72 hours. Sepsis Labs: No results for input(s): PROCALCITON, LATICACIDVEN in the last 168 hours.  Recent Results (from the past 240 hour(s))  Resp Panel by RT-PCR (Flu A&B, Covid) Nasopharyngeal Swab     Status: None   Collection Time: 09/25/21  3:32 PM   Specimen: Nasopharyngeal Swab; Nasopharyngeal(NP) swabs in vial transport medium  Result Value Ref Range Status   SARS Coronavirus 2 by RT PCR NEGATIVE NEGATIVE Final    Comment: (NOTE) SARS-CoV-2 target nucleic acids are NOT DETECTED.  The SARS-CoV-2 RNA is generally detectable in upper respiratory specimens during the acute phase of infection. The lowest concentration of SARS-CoV-2 viral copies this assay can detect is 138 copies/mL. A negative result does not preclude SARS-Cov-2 infection and should not be used as the sole basis for treatment or other patient management decisions. A negative result may occur with  improper specimen  collection/handling, submission of specimen other than nasopharyngeal swab, presence of viral mutation(s) within the areas targeted by this assay, and inadequate number of viral copies(<138 copies/mL). A negative result must be combined with clinical observations, patient history, and epidemiological information. The expected result is Negative.  Fact Sheet for Patients:  BloggerCourse.com  Fact Sheet for Healthcare Providers:  SeriousBroker.it  This test is no t yet approved or cleared by the [place] FDA and  has been authorized for detection and/or diagnosis of SARS-CoV-2 by FDA under an Emergency Use Authorization (EUA). This EUA will remain  in effect (meaning this test can be used) for the duration of the COVID-19 declaration under Section 564(b)(1) of the Act, 21 U.S.C.section 360bbb-3(b)(1), unless the authorization is terminated  or revoked sooner.       Influenza A by PCR NEGATIVE NEGATIVE Final   Influenza B by PCR NEGATIVE NEGATIVE Final    Comment: (NOTE) The Xpert Xpress SARS-CoV-2/FLU/RSV plus assay is intended as an aid in the diagnosis of influenza from Nasopharyngeal swab specimens and should not be  used as a sole basis for treatment. Nasal washings and aspirates are unacceptable for Xpert Xpress SARS-CoV-2/FLU/RSV testing.  Fact Sheet for Patients: BloggerCourse.com  Fact Sheet for Healthcare Providers: SeriousBroker.it  This test is not yet approved or cleared by the [place] FDA and has been authorized for detection and/or diagnosis of SARS-CoV-2 by FDA under an Emergency Use Authorization (EUA). This EUA will remain in effect (meaning this test can be used) for the duration of the COVID-19 declaration under Section 564(b)(1) of the Act, 21 U.S.C. section 360bbb-3(b)(1), unless the authorization is terminated or revoked.  Performed at [facility], [address], [place], [place] [address]          Radiology Studies: DG Chest 2 View  Result Date: 09/25/2021 CLINICAL DATA:  Shortness of breath. EXAM: CHEST - 2 VIEW COMPARISON:  Chest x-ray 09/05/2021 FINDINGS: The heart size and mediastinal contours are within normal limits. Both lungs are clear. No acute fractures. Lumbar fusion hardware is present. Vascular stent in the right upper arm. IMPRESSION: No active cardiopulmonary disease. Electronically Signed   By: Darliss Cheney M.D.   On: 09/25/2021 16:01        Scheduled Meds: Continuous Infusions:   LOS: 1 day    Time spent: 35 minutes    Dorcas Carrow, MD [facility] Pager [phone]

## 2021-09-27 ENCOUNTER — Telehealth: Payer: Self-pay

## 2021-09-27 ENCOUNTER — Inpatient Hospital Stay (HOSPITAL_COMMUNITY): Payer: Medicare Other

## 2021-09-27 DIAGNOSIS — K922 Gastrointestinal hemorrhage, unspecified: Secondary | ICD-10-CM | POA: Diagnosis not present

## 2021-09-27 LAB — RENAL FUNCTION PANEL
Albumin: 3.3 g/dL — ABNORMAL LOW (ref 3.5–5.0)
Anion gap: 18 — ABNORMAL HIGH (ref 5–15)
BUN: 44 mg/dL — ABNORMAL HIGH (ref 8–23)
CO2: 21 mmol/L — ABNORMAL LOW (ref 22–32)
Calcium: 9.6 mg/dL (ref 8.9–10.3)
Chloride: 98 mmol/L (ref 98–111)
Creatinine, Ser: 10.35 mg/dL — ABNORMAL HIGH (ref 0.44–1.00)
GFR, Estimated: 4 mL/min — ABNORMAL LOW (ref >60)
Glucose, Bld: 74 mg/dL (ref 70–99)
Phosphorus: 5.9 mg/dL — ABNORMAL HIGH (ref 2.5–4.6)
Potassium: 4.5 mmol/L (ref 3.5–5.1)
Sodium: 137 mmol/L (ref 135–145)

## 2021-09-27 LAB — HEPATITIS B SURFACE ANTIBODY,QUALITATIVE: Hep B S Ab: REACTIVE — AB

## 2021-09-27 LAB — HEPATITIS B CORE ANTIBODY, TOTAL: Hep B Core Total Ab: NONREACTIVE

## 2021-09-27 LAB — HEMOGLOBIN AND HEMATOCRIT, BLOOD
HCT: 26.2 % — ABNORMAL LOW (ref 36.0–46.0)
HCT: 23.5 % — ABNORMAL LOW (ref 36.0–46.0)
Hemoglobin: 8.4 g/dL — ABNORMAL LOW (ref 12.0–15.0)
Hemoglobin: 7.5 g/dL — ABNORMAL LOW (ref 12.0–15.0)

## 2021-09-27 LAB — HEPATITIS B SURFACE ANTIGEN: Hepatitis B Surface Ag: NONREACTIVE

## 2021-09-27 MED ORDER — CALCITRIOL 0.25 MCG PO CAPS
0.7500 ug | ORAL_CAPSULE | ORAL | Status: DC
  Administered 2021-09-27 – 2021-10-02 (×3): 0.75 ug via ORAL
  Filled 2021-09-27 (×4): qty 3

## 2021-09-27 MED ORDER — CHLORHEXIDINE GLUCONATE CLOTH 2 % EX PADS
6.0000 | MEDICATED_PAD | Freq: Every day | CUTANEOUS | Status: DC
  Administered 2021-09-28 – 2021-10-02 (×5): 6 via TOPICAL

## 2021-09-27 MED ORDER — LIDOCAINE-PRILOCAINE 2.5-2.5 % EX CREA: 1.0000 "application " | TOPICAL_CREAM | CUTANEOUS | Status: DC | PRN

## 2021-09-27 MED ORDER — LINACLOTIDE 145 MCG PO CAPS
145.0000 ug | ORAL_CAPSULE | Freq: Every day | ORAL | Status: DC
  Administered 2021-09-27 – 2021-10-02 (×3): 145 ug via ORAL
  Filled 2021-09-27 (×3): qty 1

## 2021-09-27 MED ORDER — LIDOCAINE HCL (PF) 1 % IJ SOLN: 5.0000 mL | INTRAMUSCULAR | Status: DC | PRN

## 2021-09-27 MED ORDER — SODIUM CHLORIDE 0.9 % IV SOLN: 100.0000 mL | INTRAVENOUS | Status: DC | PRN

## 2021-09-27 MED ORDER — IOHEXOL 350 MG/ML SOLN
100.0000 mL | Freq: Once | INTRAVENOUS | Status: AC | PRN
  Administered 2021-09-27: 100 mL via INTRAVENOUS

## 2021-09-27 MED ORDER — PENTAFLUOROPROP-TETRAFLUOROETH EX AERO: 1.0000 "application " | INHALATION_SPRAY | CUTANEOUS | Status: DC | PRN

## 2021-09-27 NOTE — Procedures (Signed)
? ?  I was present at this dialysis session, have reviewed the session itself and made  appropriate changes ?Kelly Splinter MD ?[employer] ?pager [phone]   ?09/27/2021, 4:25 PM ? ? ?

## 2021-09-27 NOTE — Progress Notes (Signed)
PROGRESS NOTE    Jane [name]  CZY:[id number] DOB: Oct 23, 1945 DOA: 09/25/2021 PCP: [name], [name], MD    Brief Narrative:  76 year old with history of sigmoid colectomy secondary to perforated diverticulitis in 2017, ESRD on hemodialysis Monday Wednesday Friday, paroxysmal A-fib on Coumadin, history of multiple GI bleeding and vascular malformations, history of sleep apnea not on CPAP presented with several weeks of melena and drop in hemoglobin called by dialysis center after routine blood draw.  She has been noted to have melena for at least 4 weeks now.  Occasionally becoming dizzy after dialysis sessions.  She has been holding her Coumadin since 3/3 secondary to high INR.  Seen by Coumadin clinic and was advised to discontinue Coumadin and she is planned to be referred for Watchman device.  She was scheduled for colonoscopy by GI as outpatient on 3/14, however due to drop in hemoglobin needed to come to the hospital.   Assessment & Plan:   Acute lower GI bleeding, anemia of acute blood loss: With history of extensive recurrent GI bleeding in the past.  Currently off Coumadin.  INR stable. Hemoglobin 7.6, 9.6 a week ago. 1 unit PRBC transfusion done-7.8, 8.  Appropriately responded.  We will continue to monitor every 12 hours.  Transfuse if less than 7.   EGD with no evidence of bleeding. Colonoscopy and enteroscopy with no source of bleeding, large pool of blood ileocolic area. CT angiogram with no active bleeding, pooling of contrast on terminal ileum area. On clear liquid diet.  Conservative management is anticipated, following GI recommendations.  ESRD on hemodialysis: For dialysis today.  Paroxysmal A-fib: Currently rate controlled.  Not on any medications.  Cardiology plan to keep off anticoagulation.  They will follow-up.   DVT prophylaxis: SCDs Start: 09/26/21 0102   Code Status: Full code Family Communication: Daughter on the phone. Disposition Plan: Status is:  Inpatient Remains inpatient appropriate because: Inpatient procedure planned.  Ongoing lower GI bleed.     Consultants:  Gastroenterology Nephrology, will notify  Procedures:  Scheduled for endoscopy today  Antimicrobials:  None   Subjective:  Patient seen and examined.  Slightly anxious with ongoing issues.  No bowel movement after colonoscopy procedure since yesterday.  Denies any nausea vomiting or abdominal pain or discomfort.  Objective: Vitals:   09/26/21 1943 09/26/21 1947 09/27/21 0846 09/27/21 1228  BP:  (!) 159/48 (!) 129/50 125/74  Pulse:  88 85 93  Resp:  '16 20 20  '$ Temp:   97.7 F (36.5 C) 98.6 F (37 C)  TempSrc:   Oral Oral  SpO2:  99%    Weight: 83.6 kg     Height: '5\' 5"'$  (1.651 m)       Intake/Output Summary (Last 24 hours) at 09/27/2021 1242 Last data filed at 09/26/2021 1520 Gross per 24 hour  Intake 100 ml  Output --  Net 100 ml   Filed Weights   09/26/21 1943  Weight: 83.6 kg    Examination:  General: Looks fairly comfortable on room air.  Sitting in chair. Cardiovascular: S1-S2 normal.  Regular rate rhythm. Respiratory: Bilateral clear.  No added sounds. Gastrointestinal: Soft.  Nontender.  Bowel sound present. Ext: Right upper extremity AV fistula. Neuro: Intact. Musculoskeletal: No deformities.     Data Reviewed: I have personally reviewed following labs and imaging studies  CBC: Recent Labs  Lab 09/25/21 1151 09/25/21 1534 09/25/21 2306 09/26/21 0208 09/26/21 1020 09/26/21 1855 09/27/21 0627  WBC 6.1 5.8  --  6.3  --   --   --  HGB 7.7 Repeated and verified X2.* 7.6* 8.7* 7.9* 8.0* 9.0* 8.4*  HCT 23.6 Repeated and verified X2.* 24.5* 28.7* 25.0*  --  30.0* 26.2*  MCV 92.6 99.2  --  95.1  --   --   --   PLT 201.0 204  --  179  --   --   --    Basic Metabolic Panel: Recent Labs  Lab 09/25/21 1534 09/26/21 0208  NA 138 141  K 3.8 4.2  CL 100 105  CO2 28 23  GLUCOSE 117* 89  BUN 17 25*  CREATININE 5.42* 7.03*   CALCIUM 9.9 9.5   GFR: Estimated Creatinine Clearance: 7.2 mL/min (A) (by C-G formula based on SCr of 7.03 mg/dL (H)). Liver Function Tests: Recent Labs  Lab 09/25/21 1534  AST 20  ALT 14  ALKPHOS 108  BILITOT 0.4  PROT 7.5  ALBUMIN 3.6   No results for input(s): LIPASE, AMYLASE in the last 168 hours. No results for input(s): AMMONIA in the last 168 hours. Coagulation Profile: Recent Labs  Lab 09/24/21 1023 09/25/21 1654  INR 1.9* 1.2   Cardiac Enzymes: No results for input(s): CKTOTAL, CKMB, CKMBINDEX, TROPONINI in the last 168 hours. BNP (last 3 results) No results for input(s): PROBNP in the last 8760 hours. HbA1C: No results for input(s): HGBA1C in the last 72 hours. CBG: No results for input(s): GLUCAP in the last 168 hours. Lipid Profile: No results for input(s): CHOL, HDL, LDLCALC, TRIG, CHOLHDL, LDLDIRECT in the last 72 hours. Thyroid Function Tests: No results for input(s): TSH, T4TOTAL, FREET4, T3FREE, THYROIDAB in the last 72 hours. Anemia Panel: No results for input(s): VITAMINB12, FOLATE, FERRITIN, TIBC, IRON, RETICCTPCT in the last 72 hours. Sepsis Labs: No results for input(s): PROCALCITON, LATICACIDVEN in the last 168 hours.  Recent Results (from the past 240 hour(s))  Resp Panel by RT-PCR (Flu A&B, Covid) Nasopharyngeal Swab     Status: None   Collection Time: 09/25/21  3:32 PM   Specimen: Nasopharyngeal Swab; Nasopharyngeal(NP) swabs in vial transport medium  Result Value Ref Range Status   SARS Coronavirus 2 by RT PCR NEGATIVE NEGATIVE Final    Comment: (NOTE) SARS-CoV-2 target nucleic acids are NOT DETECTED.  The SARS-CoV-2 RNA is generally detectable in upper respiratory specimens during the acute phase of infection. The lowest concentration of SARS-CoV-2 viral copies this assay can detect is 138 copies/mL. A negative result does not preclude SARS-Cov-2 infection and should not be used as the sole basis for treatment or other patient  management decisions. A negative result may occur with  improper specimen collection/handling, submission of specimen other than nasopharyngeal swab, presence of viral mutation(s) within the areas targeted by this assay, and inadequate number of viral copies(<138 copies/mL). A negative result must be combined with clinical observations, patient history, and epidemiological information. The expected result is Negative.  Fact Sheet for Patients:  EntrepreneurPulse.com.au  Fact Sheet for Healthcare Providers:  IncredibleEmployment.be  This test is no t yet approved or cleared by the [place] FDA and  has been authorized for detection and/or diagnosis of SARS-CoV-2 by FDA under an Emergency Use Authorization (EUA). This EUA will remain  in effect (meaning this test can be used) for the duration of the COVID-19 declaration under Section 564(b)(1) of the Act, 21 U.S.C.section 360bbb-3(b)(1), unless the authorization is terminated  or revoked sooner.       Influenza A by PCR NEGATIVE NEGATIVE Final   Influenza B by PCR NEGATIVE NEGATIVE Final  Comment: (NOTE) The Xpert Xpress SARS-CoV-2/FLU/RSV plus assay is intended as an aid in the diagnosis of influenza from Nasopharyngeal swab specimens and should not be used as a sole basis for treatment. Nasal washings and aspirates are unacceptable for Xpert Xpress SARS-CoV-2/FLU/RSV testing.  Fact Sheet for Patients: EntrepreneurPulse.com.au  Fact Sheet for Healthcare Providers: IncredibleEmployment.be  This test is not yet approved or cleared by the [place] FDA and has been authorized for detection and/or diagnosis of SARS-CoV-2 by FDA under an Emergency Use Authorization (EUA). This EUA will remain in effect (meaning this test can be used) for the duration of the COVID-19 declaration under Section 564(b)(1) of the Act, 21 U.S.C. section 360bbb-3(b)(1),  unless the authorization is terminated or revoked.  Performed at [facility], [place] [address] [place], [place] [address]          Radiology Studies: DG Chest 2 View  Result Date: 09/25/2021 CLINICAL DATA:  Shortness of breath. EXAM: CHEST - 2 VIEW COMPARISON:  Chest x-ray 09/05/2021 FINDINGS: The heart size and mediastinal contours are within normal limits. Both lungs are clear. No acute fractures. Lumbar fusion hardware is present. Vascular stent in the right upper arm. IMPRESSION: No active cardiopulmonary disease. Electronically Signed   By: Ronney Asters M.D.   On: 09/25/2021 16:01   CT ANGIO GI BLEED  Result Date: 09/27/2021 CLINICAL DATA:  Lower GI bleeding, nonlocalized on EGD. EXAM: CTA ABDOMEN AND PELVIS WITHOUT AND WITH CONTRAST TECHNIQUE: Multidetector CT imaging of the abdomen and pelvis was performed using the standard protocol during bolus administration of intravenous contrast. Multiplanar reconstructed images and MIPs were obtained and reviewed to evaluate the vascular anatomy. RADIATION DOSE REDUCTION: This exam was performed according to the departmental dose-optimization program which includes automated exposure control, adjustment of the mA and/or kV according to patient size and/or use of iterative reconstruction technique. CONTRAST:  155m OMNIPAQUE IOHEXOL 350 MG/ML SOLN COMPARISON:  12/11/2020 FINDINGS: VASCULAR Aorta: Atheromatous calcification diffusely. No dissection or aneurysm Celiac: Diffuse arterial calcification. Mild atheromatous narrowing at the origin. SMA: Multifocal atheromatous calcification of the SMA and branch vessels. Replaced right hepatic artery. No branch occlusion, beading, or aneurysm. Somewhat prominent vessels at the distal ileocolic mesentery with greater ileocolic vein enhancement than expected for the other vessels. No active luminal hemorrhage is seen. Colonic fluid levels are noted from the ascending colon to sigmoid flexure, nonspecific.  Renals: Atheromatous calcification diffusely. No aneurysm or beading. No proximal flow limiting stenosis IMA: Patent Inflow: Diffuse arterial calcification.  No dissection or aneurysm Proximal Outflow: Arterial calcification without flow limiting stenosis or dissection. Veins: Ileocolic vein findings noted above. Systemic and portal venous veins are patent on the delayed phase. Review of the MIP images confirms the above findings. NON-VASCULAR Lower chest: Atherosclerosis of the aorta and coronaries. No acute finding Hepatobiliary: No focal liver abnormality.Distended gallbladder. Subtle layering high density which could be sludge or tiny calculi. No evidence of acute cholecystitis. No bile duct dilatation Pancreas: Unremarkable. Spleen: Unremarkable. Adrenals/Urinary Tract: Negative adrenals. No hydronephrosis. Innumerable kidneys throughout the bilateral renal parenchyma with small areas of mural calcification and a few thin septations. Negative collapsed bladder. Stomach/Bowel: Sigmoidectomy. No notable diverticulosis of the colon. No evidence of active intraluminal hemorrhage. No detected bowel wall thickening. Prominent thickness of the stomach associated with collapse, reportedly there has been EGD. Lymphatic: No mass or adenopathy. Reproductive:Unremarkable for age. Other: Small volume peritoneal fluid. Musculoskeletal: No acute finding. Subchondral erosions at the sacroiliac joints and symphysis pubis with ganglion  at the symphysis pubis. Suspect renal osteodystrophy. Lumbar spine degeneration with scoliosis and fusion hardware. No solid arthrodesis is seen at L4-5. IMPRESSION: VASCULAR 1. No active bleeding by CTA. 2. Accentuated ileocolic mesenteric vessels with early opacification of the lower ileocolic vein, angiodysplasia is a consideration. 3. Extensive arterial calcification.  No acute vascular finding. NON-VASCULAR 1. Gallbladder sludge or small calculi. Full gallbladder without acute inflammatory  changes. 2. ESRD associated polycystic kidneys. Electronically Signed   By: Jorje Guild M.D.   On: 09/27/2021 06:21        Scheduled Meds: Continuous Infusions:   LOS: 2 days    Time spent: 35 minutes    Barb Merino, MD [facility] Pager [phone]

## 2021-09-27 NOTE — Telephone Encounter (Signed)
Following message received from Carl Best, NP: ? ?Patient ended up getting admitted to the hospital and underwent a small bowel enteroscopy and colonoscopy with Dr. Tarri Glenn, blood was in the colon but exact site was not found.  She potentially will undergo a small bowel capsule endoscopy as mentioned in Jennifer's rounding note today.  Please cancel her enteroscopy and colonoscopy which was scheduled as an outpatient at [facility] with Dr. Bryan Lemma on 3/14.  Thank you ? ?Immediately called Endo Scheduling and cancelled procedure as requested. ?

## 2021-09-27 NOTE — Progress Notes (Signed)
Progress Note   Subjective  Chief Complaint: Anemia and GI bleeding  Colonoscopy and enteroscopy yesterday unrevealing for source but did show a large amount of bright red blood, CTA ordered which was also negative for source of bleeding.  This morning patient tells me that she is tired of being here and somewhat downhearted but she has not had a bowel movement since prior to time of colonoscopy.  Tells me regular she requires Linzess 145 daily and eating in order to get things going.  Denies any new complaints or concerns.   Objective   Vital signs in last 24 hours: Temp:  [97.4 F (36.3 C)-98 F (36.7 C)] 97.7 F (36.5 C) (03/10 0846) Pulse Rate:  [85-99] 85 (03/10 0846) Resp:  [13-21] 20 (03/10 0846) BP: (129-170)/(48-75) 129/50 (03/10 0846) SpO2:  [97 %-100 %] 99 % (03/09 1947) Weight:  [83.6 kg] 83.6 kg (03/09 1943) Last BM Date : 09/26/21 General:    AA female in NAD Heart:  Regular rate and rhythm; no murmurs Lungs: Respirations even and unlabored, lungs CTA bilaterally Abdomen:  Soft, nontender and nondistended. Normal bowel sounds. Psych:  Cooperative. Normal mood and affect.  Intake/Output from previous day: 03/09 0701 - 03/10 0700 In: 100 [I.V.:100] Out: -    Lab Results: Recent Labs    09/25/21 1151 09/25/21 1534 09/25/21 2306 09/26/21 0208 09/26/21 1020 09/26/21 1855 09/27/21 0627  WBC 6.1 5.8  --  6.3  --   --   --   HGB 7.7 Repeated and verified X2.* 7.6*   < > 7.9* 8.0* 9.0* 8.4*  HCT 23.6 Repeated and verified X2.* 24.5*   < > 25.0*  --  30.0* 26.2*  PLT 201.0 204  --  179  --   --   --    < > = values in this interval not displayed.   BMET Recent Labs    09/25/21 1534 09/26/21 0208  NA 138 141  K 3.8 4.2  CL 100 105  CO2 28 23  GLUCOSE 117* 89  BUN 17 25*  CREATININE 5.42* 7.03*  CALCIUM 9.9 9.5   LFT Recent Labs    09/25/21 1534  PROT 7.5  ALBUMIN 3.6  AST 20  ALT 14  ALKPHOS 108  BILITOT 0.4   PT/INR Recent Labs     09/24/21 1023 09/25/21 1654  LABPROT  --  14.8  INR 1.9* 1.2    Studies/Results: DG Chest 2 View  Result Date: 09/25/2021 CLINICAL DATA:  Shortness of breath. EXAM: CHEST - 2 VIEW COMPARISON:  Chest x-ray 09/05/2021 FINDINGS: The heart size and mediastinal contours are within normal limits. Both lungs are clear. No acute fractures. Lumbar fusion hardware is present. Vascular stent in the right upper arm. IMPRESSION: No active cardiopulmonary disease. Electronically Signed   By: Ronney Asters M.D.   On: 09/25/2021 16:01   CT ANGIO GI BLEED  Result Date: 09/27/2021 CLINICAL DATA:  Lower GI bleeding, nonlocalized on EGD. EXAM: CTA ABDOMEN AND PELVIS WITHOUT AND WITH CONTRAST TECHNIQUE: Multidetector CT imaging of the abdomen and pelvis was performed using the standard protocol during bolus administration of intravenous contrast. Multiplanar reconstructed images and MIPs were obtained and reviewed to evaluate the vascular anatomy. RADIATION DOSE REDUCTION: This exam was performed according to the departmental dose-optimization program which includes automated exposure control, adjustment of the mA and/or kV according to patient size and/or use of iterative reconstruction technique. CONTRAST:  119m OMNIPAQUE IOHEXOL 350 MG/ML SOLN COMPARISON:  12/11/2020  FINDINGS: VASCULAR Aorta: Atheromatous calcification diffusely. No dissection or aneurysm Celiac: Diffuse arterial calcification. Mild atheromatous narrowing at the origin. SMA: Multifocal atheromatous calcification of the SMA and branch vessels. Replaced right hepatic artery. No branch occlusion, beading, or aneurysm. Somewhat prominent vessels at the distal ileocolic mesentery with greater ileocolic vein enhancement than expected for the other vessels. No active luminal hemorrhage is seen. Colonic fluid levels are noted from the ascending colon to sigmoid flexure, nonspecific. Renals: Atheromatous calcification diffusely. No aneurysm or beading. No  proximal flow limiting stenosis IMA: Patent Inflow: Diffuse arterial calcification.  No dissection or aneurysm Proximal Outflow: Arterial calcification without flow limiting stenosis or dissection. Veins: Ileocolic vein findings noted above. Systemic and portal venous veins are patent on the delayed phase. Review of the MIP images confirms the above findings. NON-VASCULAR Lower chest: Atherosclerosis of the aorta and coronaries. No acute finding Hepatobiliary: No focal liver abnormality.Distended gallbladder. Subtle layering high density which could be sludge or tiny calculi. No evidence of acute cholecystitis. No bile duct dilatation Pancreas: Unremarkable. Spleen: Unremarkable. Adrenals/Urinary Tract: Negative adrenals. No hydronephrosis. Innumerable kidneys throughout the bilateral renal parenchyma with small areas of mural calcification and a few thin septations. Negative collapsed bladder. Stomach/Bowel: Sigmoidectomy. No notable diverticulosis of the colon. No evidence of active intraluminal hemorrhage. No detected bowel wall thickening. Prominent thickness of the stomach associated with collapse, reportedly there has been EGD. Lymphatic: No mass or adenopathy. Reproductive:Unremarkable for age. Other: Small volume peritoneal fluid. Musculoskeletal: No acute finding. Subchondral erosions at the sacroiliac joints and symphysis pubis with ganglion at the symphysis pubis. Suspect renal osteodystrophy. Lumbar spine degeneration with scoliosis and fusion hardware. No solid arthrodesis is seen at L4-5. IMPRESSION: VASCULAR 1. No active bleeding by CTA. 2. Accentuated ileocolic mesenteric vessels with early opacification of the lower ileocolic vein, angiodysplasia is a consideration. 3. Extensive arterial calcification.  No acute vascular finding. NON-VASCULAR 1. Gallbladder sludge or small calculi. Full gallbladder without acute inflammatory changes. 2. ESRD associated polycystic kidneys. Electronically Signed   By:  Jorje Guild M.D.   On: 09/27/2021 06:21    Enteroscopy 09/26/2021 Findings:      The examined esophagus was normal.      Multiple small sessile polyps were present in the gastric body. There       were multiple polyps in gastric body and fundus. One polyp had evidence       for recent bleeding and this was removed with a cold snare. The other       polyps were not removed. Resection and retrieval were complete.       Estimated blood loss was minimal.      Localized moderate mucosal changes characterized by discoloration were       found in the gastric antrum. Biopsies were taken with a cold forceps for       histology. Estimated blood loss was minimal.      Localized nodular mucosa was found in the second portion of the       duodenum. Biopsies were taken with a cold forceps for histology.       Estimated blood loss was minimal.      There was no evidence of significant pathology in the entire examined       portion of jejunum. Impression:               - Normal esophagus.                           -  Multiple gastric polyps. One polyp with evidence                            for recent bleeding was resected and retrieved.                           - Discolored mucosa in the antrum. Biopsied.                           - Nodular mucosa in the second portion of the                            duodenum. Likely to be related to prior adenoma                            resection. Biopsied to exclude recurrent adenoma.                           - No evidence for recent significant GI bleeding.                           - The examined portion of the jejunum was normal.  Colonoscopy 09/26/2021 Findings:      Non-bleeding external and internal hemorrhoids were found.      A small polyp was found in the sigmoid colon. The polyp was not removed       given the limited visualization and the active bleeding.      A large amount of fresh blood was found in the entire colon, precluding        visualization. No source was identified.      There was evidence of a prior end-to-end colo-colonic anastomosis in the       sigmoid colon. This was patent. The anastomosis was traversed.      The terminal ileum appeared normal. No blood present. Impression:               - Blood throughout the colon. But, a source was not                            idenfied.                           - No blood present the terminal ileum.                           - Non-bleeding external and internal hemorrhoids.                           - One small polyp in the sigmoid colon. Not                            resected during this procedure   Assessment / Plan:   Assessment: 1.  Melena and bright red blood per rectum: Was on Coumadin, stopped 3/3, last episode of bright red blood was prior to enteroscopy and colonoscopy yesterday morning, both procedures were unrevealing, CTA ordered after which was unrevealing, hemoglobin  is remained somewhat stable over the past 24 hours, currently 7.7; uncertain source of bleeding could consider small bowel 2.  Progressive normocytic anemia 3.  A-fib: On Coumadin typically, currently on hold 4.  ESRD: On hemodialysis Monday, Wednesday and Friday 5.  Chronic constipation: Typically controlled with Linzess 145 daily  Plan: 1.  Hemoglobin is still low but has remained stable over the past 24 hours, no further signs of GI bleeding overnight and since time of endoscopic procedures which were unrevealing.  CTA also unrevealing. 2.  We will keep the patient on a clear liquid diet for now in case consideration for pill capsule endoscopy is made later today.  If we do not do this exam then likely patient can go back on renal diet. 3.  Restarted patient's Linzess 145 mcg daily 4.  Continue to monitor hemoglobin with transfusion as needed less than 7  Thank you for your kind consultation, we will continue to follow   LOS: 2 days   Levin Erp  09/27/2021, 9:26 AM

## 2021-09-27 NOTE — Consult Note (Signed)
[facility] Renal Consultation Note    Indication for Consultation:  Management of ESRD/hemodialysis; anemia, hypertension/volume and secondary hyperparathyroidism PCP: Dr. [name].   HPI: Jane [name] is a 76 y.o. female with ESRD on hemodialysis MWF at [facility]. PMH: HTN, Afib formerly on coumadin which is now on indefinite hold, duodenal ulcers, GIB, Anemia of ESRD, SHPT. She is compliant with HD, Last HD 09/25/2021. Has been having issues with intradialyic hypotension, feeling weak and having chest tightness. EDW recently raised.   She presented to ED 09/25/2021 with 4 week C/O melena that has progressively worsened, occurring with BMs. Having issues with hypotension and dizziness during dialysis as noted above. Coumadin was stopped 09/20/2021. She is being sent for evaluation for Watchman at end of this month (Dr. Burt Knack). She has been following with GI who sent her to ED after 2 gram drop in HGB. She has been admitted for lower GIB per primary. Work up so far negative. She has rec'd 1 unit PRBCs 030/02/2022. HGB 8.4 this AM.   Past Medical History:  Diagnosis Date   Acid reflux    Anemia of chronic disease    Arthritis    Asthma    Atrial fibrillation (HCC)    Bilateral carotid bruits    Complication of anesthesia    "hard to wake up, I have sleep apnea" no CPAP   Diverticulitis    Duodenal ulcer    Dysrhythmia    Afib   ESRD (end stage renal disease) ([place])    MWF [facility]   Headache    Heart murmur    History of blood transfusion    Hypertension    Malaise and fatigue    Orthostatic hypotension    Shortness of breath    " when I walk to fast"   Sleep apnea    Syncope    Tubulovillous adenoma of colon    Past Surgical History:  Procedure Laterality Date   A/V FISTULAGRAM Right 10/18/2020   Procedure: A/V FISTULAGRAM;  Surgeon: Marty Heck, MD;  Location: [place] [facility];  Service: Cardiovascular;   Laterality: Right;   A/V FISTULAGRAM Right 09/05/2021   Procedure: A/V Fistulagram;  Surgeon: Marty Heck, MD;  Location: [place] [facility];  Service: Cardiovascular;  Laterality: Right;   AV FISTULA PLACEMENT     BACK SURGERY     Lumbar fusion L 4 and L 5   BIOPSY  01/09/2020   Procedure: BIOPSY;  Surgeon: Rush Landmark Telford Nab., MD;  Location: [place];  Service: Gastroenterology;;   BIOPSY  01/31/2021   Procedure: BIOPSY;  Surgeon: Irving Copas., MD;  Location: [place];  Service: Gastroenterology;;   BIOPSY  09/26/2021   Procedure: BIOPSY;  Surgeon: Thornton Park, MD;  Location: [place];  Service: Gastroenterology;;   COLONOSCOPY     COLONOSCOPY N/A 02/01/2021   Procedure: COLONOSCOPY;  Surgeon: Yetta Flock, MD;  Location: [place];  Service: Gastroenterology;  Laterality: N/A;   COLONOSCOPY WITH PROPOFOL N/A 09/26/2021   Procedure: COLONOSCOPY WITH PROPOFOL;  Surgeon: Thornton Park, MD;  Location: [place];  Service: Gastroenterology;  Laterality: N/A;   ENDOSCOPIC MUCOSAL RESECTION N/A 01/09/2020   Procedure: ENDOSCOPIC MUCOSAL RESECTION;  Surgeon: Rush Landmark Telford Nab., MD;  Location: [place];  Service: Gastroenterology;  Laterality: N/A;   ENDOSCOPIC MUCOSAL RESECTION N/A 01/31/2021   Procedure: ENDOSCOPIC MUCOSAL RESECTION;  Surgeon: Rush Landmark Telford Nab., MD;  Location: [place];  Service: Gastroenterology;  Laterality: N/A;  ENTEROSCOPY N/A 09/26/2021   Procedure: ENTEROSCOPY;  Surgeon: Thornton Park, MD;  Location: [place];  Service: Gastroenterology;  Laterality: N/A;   ESOPHAGOGASTRODUODENOSCOPY     ESOPHAGOGASTRODUODENOSCOPY (EGD) WITH PROPOFOL N/A 01/09/2020   Procedure: ESOPHAGOGASTRODUODENOSCOPY (EGD) WITH PROPOFOL;  Surgeon: Rush Landmark Telford Nab., MD;  Location: [place];  Service: Gastroenterology;  Laterality: N/A;   ESOPHAGOGASTRODUODENOSCOPY (EGD) WITH PROPOFOL N/A 01/13/2020   Procedure:  ESOPHAGOGASTRODUODENOSCOPY (EGD) WITH PROPOFOL;  Surgeon: Irene Shipper, MD;  Location: [place];  Service: Gastroenterology;  Laterality: N/A;   ESOPHAGOGASTRODUODENOSCOPY (EGD) WITH PROPOFOL N/A 01/31/2021   Procedure: ESOPHAGOGASTRODUODENOSCOPY (EGD) WITH PROPOFOL;  Surgeon: Rush Landmark Telford Nab., MD;  Location: [place];  Service: Gastroenterology;  Laterality: N/A;   EUS N/A 01/09/2020   Procedure: UPPER ENDOSCOPIC ULTRASOUND (EUS) RADIAL;  Surgeon: Irving Copas., MD;  Location: [place];  Service: Gastroenterology;  Laterality: N/A;   HEMOSTASIS CLIP PLACEMENT  01/09/2020   Procedure: HEMOSTASIS CLIP PLACEMENT;  Surgeon: Irving Copas., MD;  Location: [place];  Service: Gastroenterology;;   HEMOSTASIS CLIP PLACEMENT  01/31/2021   Procedure: HEMOSTASIS CLIP PLACEMENT;  Surgeon: Irving Copas., MD;  Location: [place];  Service: Gastroenterology;;   HEMOSTASIS CONTROL  01/13/2020   Procedure: HEMOSTASIS CONTROL;  Surgeon: Irene Shipper, MD;  Location: [facility] ENDOSCOPY;  Service: Gastroenterology;;  hemaspray   HOT HEMOSTASIS N/A 01/13/2020   Procedure: HOT HEMOSTASIS (ARGON PLASMA COAGULATION/BICAP);  Surgeon: Irene Shipper, MD;  Location: [place];  Service: Gastroenterology;  Laterality: N/A;   HOT HEMOSTASIS N/A 02/01/2021   Procedure: HOT HEMOSTASIS (ARGON PLASMA COAGULATION/BICAP);  Surgeon: Yetta Flock, MD;  Location: [facility] ENDOSCOPY;  Service: Gastroenterology;  Laterality: N/A;   IR GENERIC HISTORICAL  07/09/2016   IR US GUIDE VASC ACCESS RIGHT 07/09/2016 Arne Cleveland, MD [facility]   IR GENERIC HISTORICAL  07/09/2016   IR FLUORO GUIDE CV LINE RIGHT 07/09/2016 Arne Cleveland, MD [facility]   KNEE ARTHROPLASTY Left    LAPAROSCOPIC SIGMOID COLECTOMY N/A 07/11/2016   Procedure: LAPAROSCOPIC SIGMOID COLECTOMY;  Surgeon: Clovis Riley, MD;  Location: [place];  Service: General;  Laterality: N/A;   MASS EXCISION Right 04/10/2020    Procedure: EXCISION SKIN NODULE RIGHT FOREARM;  Surgeon: Waynetta Sandy, MD;  Location: [place];  Service: Vascular;  Laterality: Right;   POLYPECTOMY  09/26/2021   Procedure: POLYPECTOMY;  Surgeon: Thornton Park, MD;  Location: [place];  Service: Gastroenterology;;   Clide Deutscher  01/09/2020   Procedure: Clide Deutscher;  Surgeon: Mansouraty, Telford Nab., MD;  Location: [place];  Service: Gastroenterology;;   Clide Deutscher  01/13/2020   Procedure: Clide Deutscher;  Surgeon: Irene Shipper, MD;  Location: [facility] ENDOSCOPY;  Service: Gastroenterology;;   SUBMUCOSAL LIFTING INJECTION  01/09/2020   Procedure: SUBMUCOSAL LIFTING INJECTION;  Surgeon: Irving Copas., MD;  Location: [facility] ENDOSCOPY;  Service: Gastroenterology;;   TUBAL LIGATION     Family History  Problem Relation Age of Onset   Heart failure Mother    Stroke Mother    Other Father    Colon cancer Neg Hx    Liver disease Neg Hx    Esophageal cancer Neg Hx    Stomach cancer Neg Hx    Inflammatory bowel disease Neg Hx    Rectal cancer Neg Hx    Pancreatic cancer Neg Hx    Social History:  reports that she has never smoked. She has never been exposed to tobacco smoke. She has never used smokeless tobacco. She reports that she does not  drink alcohol and does not use drugs. Allergies  Allergen Reactions   Latex Rash   Penicillins Other (See Comments)    Yeast infection / Childhood   Sulfa Antibiotics Rash   Tape Other (See Comments)    Plastic, silicone, and paper tape causes bruising and pulls off skin. Cloth tape works fine   Prior to Admission medications   Medication Sig Start Date End Date Taking? Authorizing Provider  atorvastatin (LIPITOR) 10 MG tablet Take 1 tablet (10 mg total) by mouth daily. 08/13/21 11/11/21 Yes Adrian Prows, MD  B Complex-C-Zn-Folic Acid (DIALYVITE 595 WITH ZINC) 0.8 MG TABS Take 1 tablet by mouth in the morning. 07/19/20  Yes [provider]  CALCITRIOL PO Take 2 tablets  by mouth every Monday, Wednesday, and Friday with hemodialysis. 04/05/21 04/04/22 Yes [provider]  ethyl chloride spray Apply 1 application topically every Monday, Wednesday, and Friday with hemodialysis. 12/25/17  Yes [provider]  linaclotide (LINZESS) 145 MCG CAPS capsule TAKE 1 CAPSULE BY MOUTH ONCE DAILY BEFORE BREAKFAST Patient taking differently: Take 145 mcg by mouth daily before breakfast. 08/13/21  Yes Pyrtle, Lajuan Lines, MD  Methoxy PEG-Epoetin Beta (MIRCERA IJ) Inject 1 Dose into the vein every Monday, Wednesday, and Friday. 08/21/21 08/20/22 Yes [provider]  midodrine (PROAMATINE) 10 MG tablet Take 1 tablet (10 mg total) by mouth 3 (three) times daily as needed (low BP and dizziness and lightheadedness symptoms.). Patient taking differently: Take 10 mg by mouth 3 (three) times daily as needed (low BP and dizziness and lightheadedness symptoms. SBP <130 mmHg). 09/17/21  Yes Adrian Prows, MD  pantoprazole (PROTONIX) 40 MG tablet Take 1 tablet (40 mg total) by mouth 2 (two) times daily. 08/13/21  Yes Pyrtle, Lajuan Lines, MD  sevelamer carbonate (RENVELA) 800 MG tablet Take 800 mg by mouth in the morning and at bedtime. With meals 08/26/21  Yes [provider]  warfarin (COUMADIN) 5 MG tablet Take 1 tablet (5 mg total) by mouth daily. Or as directed by coumadin clinic. Patient taking differently: Take 5 mg by mouth daily. 08/21/21  Yes Adrian Prows, MD   Current Facility-Administered Medications  Medication Dose Route Frequency Provider Last Rate Last Admin   0.9 %  sodium chloride infusion  100 mL Intravenous PRN Valentina Gu, NP       0.9 %  sodium chloride infusion  100 mL Intravenous PRN Valentina Gu, NP       calcitRIOL (ROCALTROL) capsule 0.75 mcg  0.75 mcg Oral Q M,W,F-HD Valentina Gu, NP       Chlorhexidine Gluconate Cloth 2 % PADS 6 each  6 each Topical Q0600 Valentina Gu, NP       lidocaine (PF) (XYLOCAINE) 1 % injection 5 mL  5  mL Intradermal PRN Valentina Gu, NP       lidocaine-prilocaine (EMLA) cream 1 application.  1 application. Topical PRN Valentina Gu, NP       pentafluoroprop-tetrafluoroeth Landry Dyke) aerosol 1 application.  1 application. Topical PRN Valentina Gu, NP       Labs: Basic Metabolic Panel: Recent Labs  Lab 09/25/21 1534 09/26/21 0208 09/27/21 1211  NA 138 141 137  K 3.8 4.2 4.5  CL 100 105 98  CO2 28 23 21*  GLUCOSE 117* 89 74  BUN 17 25* 44*  CREATININE 5.42* 7.03* 10.35*  CALCIUM 9.9 9.5 9.6  PHOS  --   --  5.9*   Liver Function Tests: Recent  Labs  Lab 09/25/21 1534 09/27/21 1211  AST 20  --   ALT 14  --   ALKPHOS 108  --   BILITOT 0.4  --   PROT 7.5  --   ALBUMIN 3.6 3.3*   No results for input(s): LIPASE, AMYLASE in the last 168 hours. No results for input(s): AMMONIA in the last 168 hours. CBC: Recent Labs  Lab 09/25/21 1151 09/25/21 1534 09/25/21 2306 09/26/21 0208 09/26/21 1020 09/26/21 1855 09/27/21 0627  WBC 6.1 5.8  --  6.3  --   --   --   HGB 7.7 Repeated and verified X2.* 7.6*   < > 7.9* 8.0* 9.0* 8.4*  HCT 23.6 Repeated and verified X2.* 24.5*   < > 25.0*  --  30.0* 26.2*  MCV 92.6 99.2  --  95.1  --   --   --   PLT 201.0 204  --  179  --   --   --    < > = values in this interval not displayed.   Cardiac Enzymes: No results for input(s): CKTOTAL, CKMB, CKMBINDEX, TROPONINI in the last 168 hours. CBG: No results for input(s): GLUCAP in the last 168 hours. Iron Studies: No results for input(s): IRON, TIBC, TRANSFERRIN, FERRITIN in the last 72 hours. Studies/Results: DG Chest 2 View  Result Date: 09/25/2021 CLINICAL DATA:  Shortness of breath. EXAM: CHEST - 2 VIEW COMPARISON:  Chest x-ray 09/05/2021 FINDINGS: The heart size and mediastinal contours are within normal limits. Both lungs are clear. No acute fractures. Lumbar fusion hardware is present. Vascular stent in the right upper arm. IMPRESSION: No active cardiopulmonary  disease. Electronically Signed   By: Ronney Asters M.D.   On: 09/25/2021 16:01   CT ANGIO GI BLEED  Result Date: 09/27/2021 CLINICAL DATA:  Lower GI bleeding, nonlocalized on EGD. EXAM: CTA ABDOMEN AND PELVIS WITHOUT AND WITH CONTRAST TECHNIQUE: Multidetector CT imaging of the abdomen and pelvis was performed using the standard protocol during bolus administration of intravenous contrast. Multiplanar reconstructed images and MIPs were obtained and reviewed to evaluate the vascular anatomy. RADIATION DOSE REDUCTION: This exam was performed according to the departmental dose-optimization program which includes automated exposure control, adjustment of the mA and/or kV according to patient size and/or use of iterative reconstruction technique. CONTRAST:  154m OMNIPAQUE IOHEXOL 350 MG/ML SOLN COMPARISON:  12/11/2020 FINDINGS: VASCULAR Aorta: Atheromatous calcification diffusely. No dissection or aneurysm Celiac: Diffuse arterial calcification. Mild atheromatous narrowing at the origin. SMA: Multifocal atheromatous calcification of the SMA and branch vessels. Replaced right hepatic artery. No branch occlusion, beading, or aneurysm. Somewhat prominent vessels at the distal ileocolic mesentery with greater ileocolic vein enhancement than expected for the other vessels. No active luminal hemorrhage is seen. Colonic fluid levels are noted from the ascending colon to sigmoid flexure, nonspecific. Renals: Atheromatous calcification diffusely. No aneurysm or beading. No proximal flow limiting stenosis IMA: Patent Inflow: Diffuse arterial calcification.  No dissection or aneurysm Proximal Outflow: Arterial calcification without flow limiting stenosis or dissection. Veins: Ileocolic vein findings noted above. Systemic and portal venous veins are patent on the delayed phase. Review of the MIP images confirms the above findings. NON-VASCULAR Lower chest: Atherosclerosis of the aorta and coronaries. No acute finding  Hepatobiliary: No focal liver abnormality.Distended gallbladder. Subtle layering high density which could be sludge or tiny calculi. No evidence of acute cholecystitis. No bile duct dilatation Pancreas: Unremarkable. Spleen: Unremarkable. Adrenals/Urinary Tract: Negative adrenals. No hydronephrosis. Innumerable kidneys throughout the bilateral renal parenchyma with  small areas of mural calcification and a few thin septations. Negative collapsed bladder. Stomach/Bowel: Sigmoidectomy. No notable diverticulosis of the colon. No evidence of active intraluminal hemorrhage. No detected bowel wall thickening. Prominent thickness of the stomach associated with collapse, reportedly there has been EGD. Lymphatic: No mass or adenopathy. Reproductive:Unremarkable for age. Other: Small volume peritoneal fluid. Musculoskeletal: No acute finding. Subchondral erosions at the sacroiliac joints and symphysis pubis with ganglion at the symphysis pubis. Suspect renal osteodystrophy. Lumbar spine degeneration with scoliosis and fusion hardware. No solid arthrodesis is seen at L4-5. IMPRESSION: VASCULAR 1. No active bleeding by CTA. 2. Accentuated ileocolic mesenteric vessels with early opacification of the lower ileocolic vein, angiodysplasia is a consideration. 3. Extensive arterial calcification.  No acute vascular finding. NON-VASCULAR 1. Gallbladder sludge or small calculi. Full gallbladder without acute inflammatory changes. 2. ESRD associated polycystic kidneys. Electronically Signed   By: Jorje Guild M.D.   On: 09/27/2021 06:21    ROS: As per HPI otherwise negative.  Physical Exam: Vitals:   09/27/21 1318 09/27/21 1330 09/27/21 1400 09/27/21 1430  BP: (!) 157/50 (!) 173/53 (!) 156/56 (!) 147/43  Pulse: 87 87 88 89  Resp: 16   (!) 23  Temp:      TempSrc:      SpO2:      Weight:      Height:         General: Pleasant elderly female in no acute distress. Head: Normocephalic, atraumatic, sclera non-icteric, mucus  membranes are moist Neck: Supple. JVD not elevated. Lungs: Clear bilaterally to auscultation without wheezes, rales, or rhonchi. Breathing is unlabored. Heart: RRR with S1 S2 2/6 systolic M. No R/G. SR on monitor.  Abdomen: Soft, non-tender, non-distended with normoactive bowel sounds. No rebound/guarding. No obvious abdominal masses. M-S:  Strength and tone appear normal for age. Lower extremities:without edema or ischemic changes, no open wounds  Neuro: Alert and oriented X 3. Moves all extremities spontaneously. Psych:  Responds to questions appropriately with a normal affect. Dialysis Access: R AVF + T/B  Dialysis Orders: Center: [facility] MWF 3.5 hrs 180NRe 400/500 2.0K/3.0 Ca AVF -Heparin 2000 units IV TIW (Have already dc'd this order!) -Mircera 75 mcg IV q 2 weeks (last dose 50 mcg IV 09/20/2021) -Calcitriol 1.0 mcg PO TIW  Assessment/Plan:  GIB-W/U per GI, no obvious source of bleeding found so far. Coumadin on indefinite hold. Risk of death from GIB in ESRD patients on anticoagulation outweighs risk of CVA. Per primary/GI.  ESRD -  HD 09/27/2021 on schedule. Heparin has been Dc'd.   Hypertension/volume  - CXR unremarkable. No evidence of overt volume excess by exam. EDW recently raised D/T hypotension/dizziness. May need to increase EDW more. So far has tolerated HD without hypotensive episode. UF as tolerated. On midodrine on HD days but slightly hypertensive. Holding for now.   Anemia  - 8.4 S/P 1 units of PRBCs. Recent ESA at OP center. Follow HGB.   Metabolic bone disease -  Continue binders, VDRA  Nutrition - Renal diet with fld restrictions. Albumin 3.3. Add protein supps.  H/O PAF-Coumadin on hold. Being evaluated for Watchman. Upcoming appt with Dr. Burt Knack. SR on monitor.   Lyzbeth Genrich H. Owens Shark, NP-C 09/27/2021, 3:10 PM  [employer] [phone]

## 2021-09-27 NOTE — Care Management Important Message (Signed)
Important Message ? ?Patient Details  ?Name: Jane [name] ?MRN: [id number] ?Date of Birth: 05-30-1946 ? ? ?Medicare Important Message Given:  Yes ? ? ? ? ?[name] ?09/27/2021, 2:27 PM ?

## 2021-09-27 NOTE — Anesthesia Postprocedure Evaluation (Signed)
Anesthesia Post Note ? ?Patient: Jane [name] ? ?Procedure(s) Performed: ENTEROSCOPY ?COLONOSCOPY WITH PROPOFOL ?BIOPSY ?POLYPECTOMY ? ?  ? ?Patient location during evaluation: PACU ?Anesthesia Type: MAC ?Level of consciousness: awake and alert ?Pain management: pain level controlled ?Vital Signs Assessment: post-procedure vital signs reviewed and stable ?Respiratory status: spontaneous breathing, nonlabored ventilation and respiratory function stable ?Cardiovascular status: blood pressure returned to baseline and stable ?Postop Assessment: no apparent nausea or vomiting ?Anesthetic complications: no ? ? ?No notable events documented. ? ?Last Vitals:  ?Vitals:  ? 09/27/21 1530 09/27/21 1600  ?BP: (!) 175/55 (!) 168/55  ?Pulse: 93 95  ?Resp: 20   ?Temp:    ?SpO2:    ?  ?Last Pain:  ?Vitals:  ? 09/27/21 1310  ?TempSrc:   ?PainSc: 0-No pain  ? ?Pain Goal:   ? ?  ?  ?  ?  ?  ?  ?  ? ?Lynda Rainwater ? ? ? ? ?

## 2021-09-27 NOTE — Progress Notes (Signed)
removed 1840ms net fluid to obtain edw.  pre bp 109/87 post bp 199/58 pre weight 83.8kg standing   post weight 82.0kg   standing.  weight.  gave calcitril as ordered.  2 bandages to rua av fistula.  dressing to right arm no bleeding. ?

## 2021-09-28 DIAGNOSIS — K921 Melena: Secondary | ICD-10-CM | POA: Diagnosis not present

## 2021-09-28 DIAGNOSIS — K922 Gastrointestinal hemorrhage, unspecified: Secondary | ICD-10-CM | POA: Diagnosis not present

## 2021-09-28 LAB — TYPE AND SCREEN
ABO/RH(D): O POS
Antibody Screen: NEGATIVE

## 2021-09-28 LAB — HEMOGLOBIN AND HEMATOCRIT, BLOOD
HCT: 21.9 % — ABNORMAL LOW (ref 36.0–46.0)
HCT: 30.5 % — ABNORMAL LOW (ref 36.0–46.0)
HCT: 30.4 % — ABNORMAL LOW (ref 36.0–46.0)
Hemoglobin: 7.1 g/dL — ABNORMAL LOW (ref 12.0–15.0)
Hemoglobin: 9.3 g/dL — ABNORMAL LOW (ref 12.0–15.0)
Hemoglobin: 9.5 g/dL — ABNORMAL LOW (ref 12.0–15.0)

## 2021-09-28 LAB — PREPARE RBC (CROSSMATCH)

## 2021-09-28 LAB — HEPATITIS B SURFACE ANTIBODY, QUANTITATIVE: Hep B S AB Quant (Post): 72.6 m[IU]/mL (ref >9.9)

## 2021-09-28 MED ORDER — PEG-KCL-NACL-NASULF-NA ASC-C 100 G PO SOLR: 1.0000 | Freq: Once | ORAL | Status: DC

## 2021-09-28 MED ORDER — PEG-KCL-NACL-NASULF-NA ASC-C 100 G PO SOLR
0.5000 | Freq: Once | ORAL | Status: AC
  Administered 2021-09-28: 100 g via ORAL
  Filled 2021-09-28: qty 1

## 2021-09-28 MED ORDER — BISACODYL 5 MG PO TBEC
15.0000 mg | DELAYED_RELEASE_TABLET | Freq: Once | ORAL | Status: AC
  Administered 2021-09-28: 15 mg via ORAL
  Filled 2021-09-28: qty 3

## 2021-09-28 MED ORDER — SODIUM CHLORIDE 0.9% IV SOLUTION: Freq: Once | INTRAVENOUS | Status: AC

## 2021-09-28 MED ORDER — PEG-KCL-NACL-NASULF-NA ASC-C 100 G PO SOLR
0.5000 | Freq: Once | ORAL | Status: AC
  Administered 2021-09-28: 100 g via ORAL

## 2021-09-28 NOTE — Progress Notes (Addendum)
? ? Progress Note ? ? Subjective  ?Chief Complaint: Anemia and GI bleeding ? ?Colonoscopy and enteroscopy 09/27/21 unrevealing for source but did show a large amount of bright red blood.  Subsequent CTA was negative. Has continued to pass bright red and dark red blood with and without stool since the procedures.  ? ?This morning patient tells me that she is tired of being here and somewhat downhearted but she has not had a bowel movement since prior to time of colonoscopy.  Tells me regular she requires Linzess 145 daily and eating in order to get things going.  Denies any new complaints or concerns. ? ? Objective  ? ?Vital signs in last 24 hours: ?Temp:  [97.1 ?F (36.2 ?C)-98.6 ?F (37 ?C)] 97.6 ?F (36.4 ?C) (03/11 0802) ?Pulse Rate:  [25-97] 25 (03/11 0802) ?Resp:  [16-23] 18 (03/11 0802) ?BP: (109-175)/(39-87) 119/53 (03/11 0802) ?SpO2:  [95 %-96 %] 95 % (03/11 0802) ?Weight:  [82 kg-83.8 kg] 82 kg (03/10 1618) ?Last BM Date : 09/27/21 ?General:   Resting comfortably in bed in NAD ?Heart:  Regular rate and rhythm; no murmurs ?Lungs: Respirations even and unlabored, lungs CTA bilaterally ?Abdomen:  Soft, nontender and nondistended. Normal bowel sounds. ?Psych:  Cooperative. Normal mood and affect. ? ? ?Lab Results: ?Recent Labs  ?  09/25/21 ?1151 09/25/21 ?1534 09/25/21 ?2306 09/26/21 ?3300 09/26/21 ?1020 09/27/21 ?0627 09/27/21 ?1700 09/28/21 ?0457  ?WBC 6.1 5.8  --  6.3  --   --   --   --   ?HGB 7.7 Repeated and verified X2.* 7.6*   < > 7.9*   < > 8.4* 7.5* 7.1*  ?HCT 23.6 Repeated and verified X2.* 24.5*   < > 25.0*   < > 26.2* 23.5* 21.9*  ?PLT 201.0 204  --  179  --   --   --   --   ? < > = values in this interval not displayed.  ? ?BMET ?Recent Labs  ?  09/25/21 ?1534 09/26/21 ?0208 09/27/21 ?1211  ?NA 138 141 137  ?K 3.8 4.2 4.5  ?CL 100 105 98  ?CO2 28 23 21*  ?GLUCOSE 117* 89 74  ?BUN 17 25* 44*  ?CREATININE 5.42* 7.03* 10.35*  ?CALCIUM 9.9 9.5 9.6  ? ?LFT ?Recent Labs  ?  09/25/21 ?1534 09/27/21 ?1211   ?PROT 7.5  --   ?ALBUMIN 3.6 3.3*  ?AST 20  --   ?ALT 14  --   ?ALKPHOS 108  --   ?BILITOT 0.4  --   ? ?PT/INR ?Recent Labs  ?  09/25/21 ?1654  ?LABPROT 14.8  ?INR 1.2  ? ? ?Studies/Results: ?CT ANGIO GI BLEED ? ?Result Date: 09/27/2021 ?CLINICAL DATA:  Lower GI bleeding, nonlocalized on EGD. EXAM: CTA ABDOMEN AND PELVIS WITHOUT AND WITH CONTRAST TECHNIQUE: Multidetector CT imaging of the abdomen and pelvis was performed using the standard protocol during bolus administration of intravenous contrast. Multiplanar reconstructed images and MIPs were obtained and reviewed to evaluate the vascular anatomy. RADIATION DOSE REDUCTION: This exam was performed according to the departmental dose-optimization program which includes automated exposure control, adjustment of the mA and/or kV according to patient size and/or use of iterative reconstruction technique. CONTRAST:  150m OMNIPAQUE IOHEXOL 350 MG/ML SOLN COMPARISON:  12/11/2020 FINDINGS: VASCULAR Aorta: Atheromatous calcification diffusely. No dissection or aneurysm Celiac: Diffuse arterial calcification. Mild atheromatous narrowing at the origin. SMA: Multifocal atheromatous calcification of the SMA and branch vessels. Replaced right hepatic artery. No branch occlusion, beading, or aneurysm.  Somewhat prominent vessels at the distal ileocolic mesentery with greater ileocolic vein enhancement than expected for the other vessels. No active luminal hemorrhage is seen. Colonic fluid levels are noted from the ascending colon to sigmoid flexure, nonspecific. Renals: Atheromatous calcification diffusely. No aneurysm or beading. No proximal flow limiting stenosis IMA: Patent Inflow: Diffuse arterial calcification.  No dissection or aneurysm Proximal Outflow: Arterial calcification without flow limiting stenosis or dissection. Veins: Ileocolic vein findings noted above. Systemic and portal venous veins are patent on the delayed phase. Review of the MIP images confirms the above  findings. NON-VASCULAR Lower chest: Atherosclerosis of the aorta and coronaries. No acute finding Hepatobiliary: No focal liver abnormality.Distended gallbladder. Subtle layering high density which could be sludge or tiny calculi. No evidence of acute cholecystitis. No bile duct dilatation Pancreas: Unremarkable. Spleen: Unremarkable. Adrenals/Urinary Tract: Negative adrenals. No hydronephrosis. Innumerable kidneys throughout the bilateral renal parenchyma with small areas of mural calcification and a few thin septations. Negative collapsed bladder. Stomach/Bowel: Sigmoidectomy. No notable diverticulosis of the colon. No evidence of active intraluminal hemorrhage. No detected bowel wall thickening. Prominent thickness of the stomach associated with collapse, reportedly there has been EGD. Lymphatic: No mass or adenopathy. Reproductive:Unremarkable for age. Other: Small volume peritoneal fluid. Musculoskeletal: No acute finding. Subchondral erosions at the sacroiliac joints and symphysis pubis with ganglion at the symphysis pubis. Suspect renal osteodystrophy. Lumbar spine degeneration with scoliosis and fusion hardware. No solid arthrodesis is seen at L4-5. IMPRESSION: VASCULAR 1. No active bleeding by CTA. 2. Accentuated ileocolic mesenteric vessels with early opacification of the lower ileocolic vein, angiodysplasia is a consideration. 3. Extensive arterial calcification.  No acute vascular finding. NON-VASCULAR 1. Gallbladder sludge or small calculi. Full gallbladder without acute inflammatory changes. 2. ESRD associated polycystic kidneys. Electronically Signed   By: Jorje Guild M.D.   On: 09/27/2021 06:21   ? ?Enteroscopy 09/26/2021 ?Impression:       - Normal esophagus. ?                          - Multiple gastric polyps. One polyp with evidence for recent bleeding was resected and retrieved. ?                          - Discolored mucosa in the antrum. Biopsied. ?                          - Nodular mucosa  in the second portion of the  ?                          duodenum. Likely to be related to prior adenoma  ?                          resection. Biopsied to exclude recurrent adenoma. ?                          - No evidence for recent significant GI bleeding. ?                          - The examined portion of the jejunum was normal. ? ?Colonoscopy 09/26/2021 ?Impression:               - Blood throughout the  colon. But, a source was not identified. ?                          - No blood present the terminal ileum. ?                          - Non-bleeding external and internal hemorrhoids. ?                          - One small polyp in the sigmoid colon. Not resected during this procedure ? ? Assessment / Plan:   ?Assessment: ?1.  Ongoing, painless hematochezia: Bleeding occurred on warfarin, which has been on hold since 3/3. SB enteroscopy and colonoscopy pointed to the colon as the source of bleeding. CTA was negative, but suggested a possible colonic AVM. I discussed options of continued observation off anticoagulation versus repeating colonoscopy to try to identify the site. She is strongly in favor of repeating the colon to identify any treatable lesions.  ?2.  Progressive normocytic anemia: Continued decline in hemoglobin with ongoing bleeding ?3.  A-fib: On warfarin as an outpatient, currently on hold ?4.  ESRD: On hemodialysis Monday, Wednesday and Friday ?5.  Chronic constipation: Typically controlled with Linzess 145 daily ? ?Plan: ?1.  Repeat colonoscopy 09/29/21 after an aggressive bowel prep in an effort to identify the source of bleeding.  ?2.  Clear liquid diet today ?3.  Continue Linzess 145 mcg daily ?4.  Continue serial hemoglobin/hematocrit with transfusion as indicated ? ? ? LOS: 3 days  ? ?Th[place]  09/28/2021, 8:11 AM ? ?  ?

## 2021-09-28 NOTE — H&P (View-Only) (Signed)
? ? Progress Note ? ? Subjective  ?Chief Complaint: Anemia and GI bleeding ? ?Colonoscopy and enteroscopy 09/27/21 unrevealing for source but did show a large amount of bright red blood.  Subsequent CTA was negative. Has continued to pass bright red and dark red blood with and without stool since the procedures.  ? ?This morning patient tells me that she is tired of being here and somewhat downhearted but she has not had a bowel movement since prior to time of colonoscopy.  Tells me regular she requires Linzess 145 daily and eating in order to get things going.  Denies any new complaints or concerns. ? ? Objective  ? ?Vital signs in last 24 hours: ?Temp:  [97.1 ?F (36.2 ?C)-98.6 ?F (37 ?C)] 97.6 ?F (36.4 ?C) (03/11 0802) ?Pulse Rate:  [25-97] 25 (03/11 0802) ?Resp:  [16-23] 18 (03/11 0802) ?BP: (109-175)/(39-87) 119/53 (03/11 0802) ?SpO2:  [95 %-96 %] 95 % (03/11 0802) ?Weight:  [82 kg-83.8 kg] 82 kg (03/10 1618) ?Last BM Date : 09/27/21 ?General:   Resting comfortably in bed in NAD ?Heart:  Regular rate and rhythm; no murmurs ?Lungs: Respirations even and unlabored, lungs CTA bilaterally ?Abdomen:  Soft, nontender and nondistended. Normal bowel sounds. ?Psych:  Cooperative. Normal mood and affect. ? ? ?Lab Results: ?Recent Labs  ?  09/25/21 ?1151 09/25/21 ?1534 09/25/21 ?2306 09/26/21 ?4854 09/26/21 ?1020 09/27/21 ?0627 09/27/21 ?1700 09/28/21 ?0457  ?WBC 6.1 5.8  --  6.3  --   --   --   --   ?HGB 7.7 Repeated and verified X2.* 7.6*   < > 7.9*   < > 8.4* 7.5* 7.1*  ?HCT 23.6 Repeated and verified X2.* 24.5*   < > 25.0*   < > 26.2* 23.5* 21.9*  ?PLT 201.0 204  --  179  --   --   --   --   ? < > = values in this interval not displayed.  ? ?BMET ?Recent Labs  ?  09/25/21 ?1534 09/26/21 ?0208 09/27/21 ?1211  ?NA 138 141 137  ?K 3.8 4.2 4.5  ?CL 100 105 98  ?CO2 28 23 21*  ?GLUCOSE 117* 89 74  ?BUN 17 25* 44*  ?CREATININE 5.42* 7.03* 10.35*  ?CALCIUM 9.9 9.5 9.6  ? ?LFT ?Recent Labs  ?  09/25/21 ?1534 09/27/21 ?1211   ?PROT 7.5  --   ?ALBUMIN 3.6 3.3*  ?AST 20  --   ?ALT 14  --   ?ALKPHOS 108  --   ?BILITOT 0.4  --   ? ?PT/INR ?Recent Labs  ?  09/25/21 ?1654  ?LABPROT 14.8  ?INR 1.2  ? ? ?Studies/Results: ?CT ANGIO GI BLEED ? ?Result Date: 09/27/2021 ?CLINICAL DATA:  Lower GI bleeding, nonlocalized on EGD. EXAM: CTA ABDOMEN AND PELVIS WITHOUT AND WITH CONTRAST TECHNIQUE: Multidetector CT imaging of the abdomen and pelvis was performed using the standard protocol during bolus administration of intravenous contrast. Multiplanar reconstructed images and MIPs were obtained and reviewed to evaluate the vascular anatomy. RADIATION DOSE REDUCTION: This exam was performed according to the departmental dose-optimization program which includes automated exposure control, adjustment of the mA and/or kV according to patient size and/or use of iterative reconstruction technique. CONTRAST:  16m OMNIPAQUE IOHEXOL 350 MG/ML SOLN COMPARISON:  12/11/2020 FINDINGS: VASCULAR Aorta: Atheromatous calcification diffusely. No dissection or aneurysm Celiac: Diffuse arterial calcification. Mild atheromatous narrowing at the origin. SMA: Multifocal atheromatous calcification of the SMA and branch vessels. Replaced right hepatic artery. No branch occlusion, beading, or aneurysm.  Somewhat prominent vessels at the distal ileocolic mesentery with greater ileocolic vein enhancement than expected for the other vessels. No active luminal hemorrhage is seen. Colonic fluid levels are noted from the ascending colon to sigmoid flexure, nonspecific. Renals: Atheromatous calcification diffusely. No aneurysm or beading. No proximal flow limiting stenosis IMA: Patent Inflow: Diffuse arterial calcification.  No dissection or aneurysm Proximal Outflow: Arterial calcification without flow limiting stenosis or dissection. Veins: Ileocolic vein findings noted above. Systemic and portal venous veins are patent on the delayed phase. Review of the MIP images confirms the above  findings. NON-VASCULAR Lower chest: Atherosclerosis of the aorta and coronaries. No acute finding Hepatobiliary: No focal liver abnormality.Distended gallbladder. Subtle layering high density which could be sludge or tiny calculi. No evidence of acute cholecystitis. No bile duct dilatation Pancreas: Unremarkable. Spleen: Unremarkable. Adrenals/Urinary Tract: Negative adrenals. No hydronephrosis. Innumerable kidneys throughout the bilateral renal parenchyma with small areas of mural calcification and a few thin septations. Negative collapsed bladder. Stomach/Bowel: Sigmoidectomy. No notable diverticulosis of the colon. No evidence of active intraluminal hemorrhage. No detected bowel wall thickening. Prominent thickness of the stomach associated with collapse, reportedly there has been EGD. Lymphatic: No mass or adenopathy. Reproductive:Unremarkable for age. Other: Small volume peritoneal fluid. Musculoskeletal: No acute finding. Subchondral erosions at the sacroiliac joints and symphysis pubis with ganglion at the symphysis pubis. Suspect renal osteodystrophy. Lumbar spine degeneration with scoliosis and fusion hardware. No solid arthrodesis is seen at L4-5. IMPRESSION: VASCULAR 1. No active bleeding by CTA. 2. Accentuated ileocolic mesenteric vessels with early opacification of the lower ileocolic vein, angiodysplasia is a consideration. 3. Extensive arterial calcification.  No acute vascular finding. NON-VASCULAR 1. Gallbladder sludge or small calculi. Full gallbladder without acute inflammatory changes. 2. ESRD associated polycystic kidneys. Electronically Signed   By: Jorje Guild M.D.   On: 09/27/2021 06:21   ? ?Enteroscopy 09/26/2021 ?Impression:       - Normal esophagus. ?                          - Multiple gastric polyps. One polyp with evidence for recent bleeding was resected and retrieved. ?                          - Discolored mucosa in the antrum. Biopsied. ?                          - Nodular mucosa  in the second portion of the  ?                          duodenum. Likely to be related to prior adenoma  ?                          resection. Biopsied to exclude recurrent adenoma. ?                          - No evidence for recent significant GI bleeding. ?                          - The examined portion of the jejunum was normal. ? ?Colonoscopy 09/26/2021 ?Impression:               - Blood throughout the  colon. But, a source was not identified. ?                          - No blood present the terminal ileum. ?                          - Non-bleeding external and internal hemorrhoids. ?                          - One small polyp in the sigmoid colon. Not resected during this procedure ? ? Assessment / Plan:   ?Assessment: ?1.  Ongoing, painless hematochezia: Bleeding occurred on warfarin, which has been on hold since 3/3. SB enteroscopy and colonoscopy pointed to the colon as the source of bleeding. CTA was negative, but suggested a possible colonic AVM. I discussed options of continued observation off anticoagulation versus repeating colonoscopy to try to identify the site. She is strongly in favor of repeating the colon to identify any treatable lesions.  ?2.  Progressive normocytic anemia: Continued decline in hemoglobin with ongoing bleeding ?3.  A-fib: On warfarin as an outpatient, currently on hold ?4.  ESRD: On hemodialysis Monday, Wednesday and Friday ?5.  Chronic constipation: Typically controlled with Linzess 145 daily ? ?Plan: ?1.  Repeat colonoscopy 09/29/21 after an aggressive bowel prep in an effort to identify the source of bleeding.  ?2.  Clear liquid diet today ?3.  Continue Linzess 145 mcg daily ?4.  Continue serial hemoglobin/hematocrit with transfusion as indicated ? ? ? LOS: 3 days  ? ?Th[place]  09/28/2021, 8:11 AM ? ?  ?

## 2021-09-28 NOTE — Progress Notes (Signed)
?[name] ?Progress Note  ? ?Subjective: Continues to have hematochezia. HGB 7.1 this AM. S/P 1 unit PRBCs this AM. Tolerated HD without hypotension  ? ?Objective ?Vitals:  ? 09/28/21 0802 09/28/21 0815 09/28/21 0830 09/28/21 1200  ?BP: (!) 119/53  (!) 118/55 (!) 141/57  ?Pulse: 85 87 83 80  ?Resp: _0 ?Temp: 97.6 ?F (36.4 ?C)  97.9 ?F (36.6 ?C)   ?TempSrc: Oral  Oral Oral  ?SpO2: 95% 98% 98% 100%  ?Weight:      ?Height:      ? ?General: Pleasant elderly female in no acute distress. ?Lungs: CTAB A/P without WOB.  ?Heart: RRR with S1 S2 2/6 systolic M. No R/G. SR on monitor.  ?Abdomen: Soft, non-tender, non-distended with normoactive bowel sounds. No rebound/guarding. No obvious abdominal masses. ?Lower extremities: No LE edema. ?Neuro: Alert and oriented X 3. Moves all extremities spontaneously. ?Dialysis Access: R AVF + T/B ?  ? ?Additional Objective ?Labs: ?Basic Metabolic Panel: ?Recent Labs  ?Lab 09/25/21 ?1534 09/26/21 ?0208 09/27/21 ?1211  ?NA 138 141 137  ?K 3.8 4.2 4.5  ?CL 100 105 98  ?CO2 28 23 21*  ?GLUCOSE 117* 89 74  ?BUN 17 25* 44*  ?CREATININE 5.42* 7.03* 10.35*  ?CALCIUM 9.9 9.5 9.6  ?PHOS  --   --  5.9*  ? ?Liver Function Tests: ?Recent Labs  ?Lab 09/25/21 ?1534 09/27/21 ?1211  ?AST 20  --   ?ALT 14  --   ?ALKPHOS 108  --   ?BILITOT 0.4  --   ?PROT 7.5  --   ?ALBUMIN 3.6 3.3*  ? ?No results for input(s): LIPASE, AMYLASE in the last 168 hours. ?CBC: ?Recent Labs  ?Lab 09/25/21 ?1151 09/25/21 ?1534 09/25/21 ?2306 09/26/21 ?9604 09/26/21 ?1020 09/27/21 ?0627 09/27/21 ?1700 09/28/21 ?0457  ?WBC 6.1 5.8  --  6.3  --   --   --   --   ?HGB 7.7 Repeated and verified X2.* 7.6*   < > 7.9*   < > 8.4* 7.5* 7.1*  ?HCT 23.6 Repeated and verified X2.* 24.5*   < > 25.0*   < > 26.2* 23.5* 21.9*  ?MCV 92.6 99.2  --  95.1  --   --   --   --   ?PLT 201.0 204  --  179  --   --   --   --   ? < > = values in this interval not displayed.  ? ?Blood Culture ?   ?Component Value Date/Time  ? [place], CLEAN CATCH 08/04/2016 0932  ? [place] NONE 08/04/2016 0932  ? CULT (A) 08/04/2016 0932  ?  >=100,000 COLONIES/mL LACTOBACILLUS SPECIES ?Standardized susceptibility testing for this organism is not available. ?  ? REPTSTATUS 08/05/2016 FINAL 08/04/2016 0932  ? ? ?Cardiac Enzymes: ?No results for input(s): CKTOTAL, CKMB, CKMBINDEX, TROPONINI in the last 168 hours. ?CBG: ?No results for input(s): GLUCAP in the last 168 hours. ?Iron Studies: No results for input(s): IRON, TIBC, TRANSFERRIN, FERRITIN in the last 72 hours. ?_1 @ ?Studies/Results: ?CT ANGIO GI BLEED ? ?Result Date: 09/27/2021 ?CLINICAL DATA:  Lower GI bleeding, nonlocalized on EGD. EXAM: CTA ABDOMEN AND PELVIS WITHOUT AND WITH CONTRAST TECHNIQUE: Multidetector CT imaging of the abdomen and pelvis was performed using the standard protocol during bolus administration of intravenous contrast. Multiplanar reconstructed images and MIPs were obtained and reviewed to evaluate the vascular anatomy. RADIATION DOSE REDUCTION: This exam was performed according to the departmental dose-optimization program which includes automated  exposure control, adjustment of the mA and/or kV according to patient size and/or use of iterative reconstruction technique. CONTRAST:  184m OMNIPAQUE IOHEXOL 350 MG/ML SOLN COMPARISON:  12/11/2020 FINDINGS: VASCULAR Aorta: Atheromatous calcification diffusely. No dissection or aneurysm Celiac: Diffuse arterial calcification. Mild atheromatous narrowing at the origin. SMA: Multifocal atheromatous calcification of the SMA and branch vessels. Replaced right hepatic artery. No branch occlusion, beading, or aneurysm. Somewhat prominent vessels at the distal ileocolic mesentery with greater ileocolic vein enhancement than expected for the other vessels. No active luminal hemorrhage is seen. Colonic fluid levels are noted from the ascending colon to sigmoid flexure, nonspecific. Renals: Atheromatous calcification diffusely. No  aneurysm or beading. No proximal flow limiting stenosis IMA: Patent Inflow: Diffuse arterial calcification.  No dissection or aneurysm Proximal Outflow: Arterial calcification without flow limiting stenosis or dissection. Veins: Ileocolic vein findings noted above. Systemic and portal venous veins are patent on the delayed phase. Review of the MIP images confirms the above findings. NON-VASCULAR Lower chest: Atherosclerosis of the aorta and coronaries. No acute finding Hepatobiliary: No focal liver abnormality.Distended gallbladder. Subtle layering high density which could be sludge or tiny calculi. No evidence of acute cholecystitis. No bile duct dilatation Pancreas: Unremarkable. Spleen: Unremarkable. Adrenals/Urinary Tract: Negative adrenals. No hydronephrosis. Innumerable kidneys throughout the bilateral renal parenchyma with small areas of mural calcification and a few thin septations. Negative collapsed bladder. Stomach/Bowel: Sigmoidectomy. No notable diverticulosis of the colon. No evidence of active intraluminal hemorrhage. No detected bowel wall thickening. Prominent thickness of the stomach associated with collapse, reportedly there has been EGD. Lymphatic: No mass or adenopathy. Reproductive:Unremarkable for age. Other: Small volume peritoneal fluid. Musculoskeletal: No acute finding. Subchondral erosions at the sacroiliac joints and symphysis pubis with ganglion at the symphysis pubis. Suspect renal osteodystrophy. Lumbar spine degeneration with scoliosis and fusion hardware. No solid arthrodesis is seen at L4-5. IMPRESSION: VASCULAR 1. No active bleeding by CTA. 2. Accentuated ileocolic mesenteric vessels with early opacification of the lower ileocolic vein, angiodysplasia is a consideration. 3. Extensive arterial calcification.  No acute vascular finding. NON-VASCULAR 1. Gallbladder sludge or small calculi. Full gallbladder without acute inflammatory changes. 2. ESRD associated polycystic kidneys.  Electronically Signed   By: JJorje GuildM.D.   On: 09/27/2021 06:21   ?Medications: ? ? bisacodyl  15 mg Oral Once  ? calcitRIOL  0.75 mcg Oral Q M,W,F-HD  ? Chlorhexidine Gluconate Cloth  6 each Topical Q0600  ? linaclotide  145 mcg Oral QAC breakfast  ? peg 3350 powder  0.5 kit Oral Once  ? And  ? peg 3350 powder  0.5 kit Oral Once  ? ? ? ?[facility]: [facility] ?3.5 hrs 180NRe 400/500 2.0K/3.0 Ca AVF ?-Heparin 2000 units IV TIW (Have already dc'd this order!) ?-Mircera 75 mcg IV q 2 weeks (last dose 50 mcg IV 09/20/2021) ?-Calcitriol 1.0 mcg PO TIW ?  ?Assessment/Plan: ? GIB-W/U per GI, no obvious source of bleeding found so far. Possible repeat colonoscopy next week. Coumadin on indefinite hold. Risk of death from GIB in ESRD patients on anticoagulation outweighs risk of CVA. Per primary/GI. ? ESRD -  Next HD 09/30/2021 on schedule. Heparin has been Dc'd here and at OP clinic.  ? Hypertension/volume  - CXR unremarkable. No evidence of overt volume excess by exam. EDW recently raised D/T hypotension/dizziness. May need to increase EDW more. So far has tolerated HD without hypotensive episode.Net UF 1787 ml Post wt 82 kg. Having issues at end of tx with hypotension. Add  UFP 2. UF as tolerated. On midodrine on HD days but slightly hypertensive yesterday on HD. Holding for now.  ? Anemia  - 8.4 S/P 1 units of PRBCs 09/27/2021. Back down to 7.1 this AM. Completed 2nd unit PRBCs this AM. Recent ESA at OP center. Follow HGB.  ? Metabolic bone disease -  Continue binders, VDRA. Corrected Ca+ 10.6. Change to 2.5 Ca bath.  ? Nutrition - Renal diet with fld restrictions. Albumin 3.3. Add protein supps.  ?H/O PAF-Coumadin on hold. Being evaluated for Watchman. Upcoming appt with Dr. Burt Knack. SR on monitor.  ?  ? ?Eliya Bubar H. Loreta Blouch NP-C ?09/28/2021, 12:59 PM  ?[facility] ?[phone] ? ? ?  ? ?

## 2021-09-28 NOTE — Progress Notes (Signed)
PROGRESS NOTE    Jane [name]  DZH:[id number] DOB: 10-Oct-1945 DOA: 09/25/2021 PCP: [name], [name], MD    Brief Narrative:  76 year old with history of sigmoid colectomy secondary to perforated diverticulitis in 2017, ESRD on hemodialysis Monday Wednesday Friday, paroxysmal A-fib on Coumadin, history of multiple GI bleeding and vascular malformations, history of sleep apnea not on CPAP presented with several weeks of melena and drop in hemoglobin called by dialysis center after routine blood draw.  She has been noted to have melena for at least 4 weeks now.  Occasionally becoming dizzy after dialysis sessions.  She has been holding her Coumadin since 3/3 secondary to high INR.  Seen by Coumadin clinic and was advised to discontinue Coumadin and she is planned to be referred for Watchman device.  She was scheduled for colonoscopy by GI as outpatient on 3/14, however due to drop in hemoglobin needed to come to the hospital.   Assessment & Plan:   Acute lower GI bleeding, anemia of acute blood loss: With history of extensive recurrent GI bleeding in the past.  Currently off Coumadin.  INR stable. Baseline hemoglobin 9.6, 1 week ago. Hemoglobin 7.6-1 unit of PRBC-7.8, 8, 7.  We will transfuse 1 more unit today.  Patient consented. EGD with no evidence of bleeding. Colonoscopy and enteroscopy with no source of bleeding, large pool of fresh blood in the ileocolic area.  Patient continued to drop her hemoglobin and continued to have fresh rectal bleeding. CT angiogram with no active bleeding. Clear liquid diet today.  Bowel prep and another colonoscopy for tomorrow.  Followed by GI. Transfuse to keep hemoglobin more than 8.  ESRD on hemodialysis: Receiving dialysis on her schedule.  Paroxysmal A-fib: Currently rate controlled.  Not on any medications.  Cardiology plan to keep off anticoagulation.  They will follow-up.   DVT prophylaxis: SCDs Start: 09/26/21 0102   Code Status: Full code Family  Communication: Daughter on the phone 3/10. Disposition Plan: Status is: Inpatient Remains inpatient appropriate because: Inpatient procedure planned.  Ongoing lower GI bleed.     Consultants:  Gastroenterology Nephrology, will notify  Procedures:  Upper GI endoscopy, colonoscopy 3/9 Hemodialysis on hold reschedule  Antimicrobials:  None   Subjective:  Patient seen and examined.  Overnight she had 3 bowel movements mostly with old stinky blood, some with fresh bleeding.  Blood pressure stable.  Denies any nausea vomiting abdominal discomfort. Hemoglobin was 7.  Written for 1 unit of PRBC. Seen by GI and plan for repeat colonoscopy tomorrow.  Objective: Vitals:   09/27/21 2022 09/28/21 0802 09/28/21 0815 09/28/21 0830  BP: (!) 117/45 (!) 119/53  (!) 118/55  Pulse: 94 85 87 83  Resp:  18  18  Temp: 98 F (36.7 C) 97.6 F (36.4 C)  97.9 F (36.6 C)  TempSrc: Oral Oral  Oral  SpO2: 96% 95% 98% 98%  Weight:      Height:        Intake/Output Summary (Last 24 hours) at 09/28/2021 1102 Last data filed at 09/27/2021 1618 Gross per 24 hour  Intake --  Output 1787 ml  Net -1787 ml   Filed Weights   09/26/21 1943 09/27/21 1310 09/27/21 1618  Weight: 83.6 kg 83.8 kg 82 kg    Examination:  General: Looks fairly comfortable on room air.   Cardiovascular: S1-S2 normal.  Regular rate rhythm.  No added sounds. Respiratory: Bilateral clear.  No added sounds. Gastrointestinal: Soft.  Nontender.  Bowel sound present. Ext: Right upper  extremity AV fistula. Neuro: Intact. Musculoskeletal: No deformities.     Data Reviewed: I have personally reviewed following labs and imaging studies  CBC: Recent Labs  Lab 09/25/21 1151 09/25/21 1534 09/25/21 2306 09/26/21 0208 09/26/21 1020 09/26/21 1855 09/27/21 0627 09/27/21 1700 09/28/21 0457  WBC 6.1 5.8  --  6.3  --   --   --   --   --   HGB 7.7 Repeated and verified X2.* 7.6*   < > 7.9* 8.0* 9.0* 8.4* 7.5* 7.1*  HCT 23.6  Repeated and verified X2.* 24.5*   < > 25.0*  --  30.0* 26.2* 23.5* 21.9*  MCV 92.6 99.2  --  95.1  --   --   --   --   --   PLT 201.0 204  --  179  --   --   --   --   --    < > = values in this interval not displayed.   Basic Metabolic Panel: Recent Labs  Lab 09/25/21 1534 09/26/21 0208 09/27/21 1211  NA 138 141 137  K 3.8 4.2 4.5  CL 100 105 98  CO2 28 23 21*  GLUCOSE 117* 89 74  BUN 17 25* 44*  CREATININE 5.42* 7.03* 10.35*  CALCIUM 9.9 9.5 9.6  PHOS  --   --  5.9*   GFR: Estimated Creatinine Clearance: 4.8 mL/min (A) (by C-G formula based on SCr of 10.35 mg/dL (H)). Liver Function Tests: Recent Labs  Lab 09/25/21 1534 09/27/21 1211  AST 20  --   ALT 14  --   ALKPHOS 108  --   BILITOT 0.4  --   PROT 7.5  --   ALBUMIN 3.6 3.3*   No results for input(s): LIPASE, AMYLASE in the last 168 hours. No results for input(s): AMMONIA in the last 168 hours. Coagulation Profile: Recent Labs  Lab 09/24/21 1023 09/25/21 1654  INR 1.9* 1.2   Cardiac Enzymes: No results for input(s): CKTOTAL, CKMB, CKMBINDEX, TROPONINI in the last 168 hours. BNP (last 3 results) No results for input(s): PROBNP in the last 8760 hours. HbA1C: No results for input(s): HGBA1C in the last 72 hours. CBG: No results for input(s): GLUCAP in the last 168 hours. Lipid Profile: No results for input(s): CHOL, HDL, LDLCALC, TRIG, CHOLHDL, LDLDIRECT in the last 72 hours. Thyroid Function Tests: No results for input(s): TSH, T4TOTAL, FREET4, T3FREE, THYROIDAB in the last 72 hours. Anemia Panel: No results for input(s): VITAMINB12, FOLATE, FERRITIN, TIBC, IRON, RETICCTPCT in the last 72 hours. Sepsis Labs: No results for input(s): PROCALCITON, LATICACIDVEN in the last 168 hours.  Recent Results (from the past 240 hour(s))  Resp Panel by RT-PCR (Flu A&B, Covid) Nasopharyngeal Swab     Status: None   Collection Time: 09/25/21  3:32 PM   Specimen: Nasopharyngeal Swab; Nasopharyngeal(NP) swabs in vial  transport medium  Result Value Ref Range Status   SARS Coronavirus 2 by RT PCR NEGATIVE NEGATIVE Final    Comment: (NOTE) SARS-CoV-2 target nucleic acids are NOT DETECTED.  The SARS-CoV-2 RNA is generally detectable in upper respiratory specimens during the acute phase of infection. The lowest concentration of SARS-CoV-2 viral copies this assay can detect is 138 copies/mL. A negative result does not preclude SARS-Cov-2 infection and should not be used as the sole basis for treatment or other patient management decisions. A negative result may occur with  improper specimen collection/handling, submission of specimen other than nasopharyngeal swab, presence of viral mutation(s) within the areas  targeted by this assay, and inadequate number of viral copies(<138 copies/mL). A negative result must be combined with clinical observations, patient history, and epidemiological information. The expected result is Negative.  Fact Sheet for Patients:  EntrepreneurPulse.com.au  Fact Sheet for Healthcare Providers:  IncredibleEmployment.be  This test is no t yet approved or cleared by the [place] FDA and  has been authorized for detection and/or diagnosis of SARS-CoV-2 by FDA under an Emergency Use Authorization (EUA). This EUA will remain  in effect (meaning this test can be used) for the duration of the COVID-19 declaration under Section 564(b)(1) of the Act, 21 U.S.C.section 360bbb-3(b)(1), unless the authorization is terminated  or revoked sooner.       Influenza A by PCR NEGATIVE NEGATIVE Final   Influenza B by PCR NEGATIVE NEGATIVE Final    Comment: (NOTE) The Xpert Xpress SARS-CoV-2/FLU/RSV plus assay is intended as an aid in the diagnosis of influenza from Nasopharyngeal swab specimens and should not be used as a sole basis for treatment. Nasal washings and aspirates are unacceptable for Xpert Xpress SARS-CoV-2/FLU/RSV testing.  Fact  Sheet for Patients: EntrepreneurPulse.com.au  Fact Sheet for Healthcare Providers: IncredibleEmployment.be  This test is not yet approved or cleared by the [place] FDA and has been authorized for detection and/or diagnosis of SARS-CoV-2 by FDA under an Emergency Use Authorization (EUA). This EUA will remain in effect (meaning this test can be used) for the duration of the COVID-19 declaration under Section 564(b)(1) of the Act, 21 U.S.C. section 360bbb-3(b)(1), unless the authorization is terminated or revoked.  Performed at [facility], [place] [address], [place],  [address]          Radiology Studies: CT ANGIO GI BLEED  Result Date: 09/27/2021 CLINICAL DATA:  Lower GI bleeding, nonlocalized on EGD. EXAM: CTA ABDOMEN AND PELVIS WITHOUT AND WITH CONTRAST TECHNIQUE: Multidetector CT imaging of the abdomen and pelvis was performed using the standard protocol during bolus administration of intravenous contrast. Multiplanar reconstructed images and MIPs were obtained and reviewed to evaluate the vascular anatomy. RADIATION DOSE REDUCTION: This exam was performed according to the departmental dose-optimization program which includes automated exposure control, adjustment of the mA and/or kV according to patient size and/or use of iterative reconstruction technique. CONTRAST:  137m OMNIPAQUE IOHEXOL 350 MG/ML SOLN COMPARISON:  12/11/2020 FINDINGS: VASCULAR Aorta: Atheromatous calcification diffusely. No dissection or aneurysm Celiac: Diffuse arterial calcification. Mild atheromatous narrowing at the origin. SMA: Multifocal atheromatous calcification of the SMA and branch vessels. Replaced right hepatic artery. No branch occlusion, beading, or aneurysm. Somewhat prominent vessels at the distal ileocolic mesentery with greater ileocolic vein enhancement than expected for the other vessels. No active luminal hemorrhage is seen. Colonic fluid  levels are noted from the ascending colon to sigmoid flexure, nonspecific. Renals: Atheromatous calcification diffusely. No aneurysm or beading. No proximal flow limiting stenosis IMA: Patent Inflow: Diffuse arterial calcification.  No dissection or aneurysm Proximal Outflow: Arterial calcification without flow limiting stenosis or dissection. Veins: Ileocolic vein findings noted above. Systemic and portal venous veins are patent on the delayed phase. Review of the MIP images confirms the above findings. NON-VASCULAR Lower chest: Atherosclerosis of the aorta and coronaries. No acute finding Hepatobiliary: No focal liver abnormality.Distended gallbladder. Subtle layering high density which could be sludge or tiny calculi. No evidence of acute cholecystitis. No bile duct dilatation Pancreas: Unremarkable. Spleen: Unremarkable. Adrenals/Urinary Tract: Negative adrenals. No hydronephrosis. Innumerable kidneys throughout the bilateral renal parenchyma with small areas of mural calcification and  a few thin septations. Negative collapsed bladder. Stomach/Bowel: Sigmoidectomy. No notable diverticulosis of the colon. No evidence of active intraluminal hemorrhage. No detected bowel wall thickening. Prominent thickness of the stomach associated with collapse, reportedly there has been EGD. Lymphatic: No mass or adenopathy. Reproductive:Unremarkable for age. Other: Small volume peritoneal fluid. Musculoskeletal: No acute finding. Subchondral erosions at the sacroiliac joints and symphysis pubis with ganglion at the symphysis pubis. Suspect renal osteodystrophy. Lumbar spine degeneration with scoliosis and fusion hardware. No solid arthrodesis is seen at L4-5. IMPRESSION: VASCULAR 1. No active bleeding by CTA. 2. Accentuated ileocolic mesenteric vessels with early opacification of the lower ileocolic vein, angiodysplasia is a consideration. 3. Extensive arterial calcification.  No acute vascular finding. NON-VASCULAR 1.  Gallbladder sludge or small calculi. Full gallbladder without acute inflammatory changes. 2. ESRD associated polycystic kidneys. Electronically Signed   By: Jorje Guild M.D.   On: 09/27/2021 06:21        Scheduled Meds: Continuous Infusions:   LOS: 3 days    Time spent: 35 minutes    Barb Merino, MD [facility] Pager [phone]

## 2021-09-29 ENCOUNTER — Inpatient Hospital Stay (HOSPITAL_COMMUNITY): Payer: Medicare Other | Admitting: Anesthesiology

## 2021-09-29 ENCOUNTER — Encounter (HOSPITAL_COMMUNITY): Payer: Self-pay | Admitting: Family Medicine

## 2021-09-29 ENCOUNTER — Encounter (HOSPITAL_COMMUNITY)
Admission: EM | Disposition: A | Discharge status: Home / Self Care | Payer: Self-pay | Source: Home / Self Care | Attending: Internal Medicine

## 2021-09-29 ENCOUNTER — Encounter: Payer: Self-pay | Admitting: Gastroenterology

## 2021-09-29 DIAGNOSIS — K921 Melena: Secondary | ICD-10-CM | POA: Diagnosis not present

## 2021-09-29 DIAGNOSIS — K922 Gastrointestinal hemorrhage, unspecified: Secondary | ICD-10-CM | POA: Diagnosis not present

## 2021-09-29 DIAGNOSIS — K635 Polyp of colon: Secondary | ICD-10-CM

## 2021-09-29 DIAGNOSIS — N289 Disorder of kidney and ureter, unspecified: Secondary | ICD-10-CM

## 2021-09-29 DIAGNOSIS — I10 Essential (primary) hypertension: Secondary | ICD-10-CM

## 2021-09-29 DIAGNOSIS — K648 Other hemorrhoids: Secondary | ICD-10-CM

## 2021-09-29 HISTORY — PX: COLONOSCOPY WITH PROPOFOL: SHX5780

## 2021-09-29 HISTORY — PX: GIVENS CAPSULE STUDY: SHX5432

## 2021-09-29 LAB — HEMOGLOBIN AND HEMATOCRIT, BLOOD
HCT: 30.1 % — ABNORMAL LOW (ref 36.0–46.0)
HCT: 29.3 % — ABNORMAL LOW (ref 36.0–46.0)
Hemoglobin: 9.3 g/dL — ABNORMAL LOW (ref 12.0–15.0)
Hemoglobin: 9.2 g/dL — ABNORMAL LOW (ref 12.0–15.0)

## 2021-09-29 LAB — BPAM RBC
Blood Product Expiration Date: 202304052359
Blood Product Expiration Date: 202304062359
ISSUE DATE / TIME: 202303081812
ISSUE DATE / TIME: 202303110805
Unit Type and Rh: 5100
Unit Type and Rh: 5100

## 2021-09-29 LAB — TYPE AND SCREEN
ABO/RH(D): O POS
Antibody Screen: NEGATIVE
Unit division: 0
Unit division: 0

## 2021-09-29 LAB — POCT I-STAT, CHEM 8
BUN: 38 mg/dL — ABNORMAL HIGH (ref 8–23)
Calcium, Ion: 1.05 mmol/L — ABNORMAL LOW (ref 1.15–1.40)
Chloride: 105 mmol/L (ref 98–111)
Creatinine, Ser: 10 mg/dL — ABNORMAL HIGH (ref 0.44–1.00)
Glucose, Bld: 94 mg/dL (ref 70–99)
HCT: 30 % — ABNORMAL LOW (ref 36.0–46.0)
Hemoglobin: 10.2 g/dL — ABNORMAL LOW (ref 12.0–15.0)
Potassium: 4 mmol/L (ref 3.5–5.1)
Sodium: 141 mmol/L (ref 135–145)
TCO2: 25 mmol/L (ref 22–32)

## 2021-09-29 SURGERY — IMAGING PROCEDURE, GI TRACT, INTRALUMINAL, VIA CAPSULE

## 2021-09-29 SURGERY — COLONOSCOPY WITH PROPOFOL
Anesthesia: Monitor Anesthesia Care

## 2021-09-29 MED ORDER — PROPOFOL 500 MG/50ML IV EMUL
INTRAVENOUS | Status: DC | PRN
  Administered 2021-09-29: 125 ug/kg/min via INTRAVENOUS

## 2021-09-29 MED ORDER — LIDOCAINE 2% (20 MG/ML) 5 ML SYRINGE
INTRAMUSCULAR | Status: DC | PRN
  Administered 2021-09-29: 40 mg via INTRAVENOUS

## 2021-09-29 MED ORDER — SODIUM CHLORIDE 0.9 % IV SOLN: INTRAVENOUS | Status: DC | PRN

## 2021-09-29 MED ORDER — PROPOFOL 10 MG/ML IV BOLUS
INTRAVENOUS | Status: DC | PRN
  Administered 2021-09-29: 10 mg via INTRAVENOUS
  Administered 2021-09-29: 11 mg via INTRAVENOUS

## 2021-09-29 MED ORDER — PHENYLEPHRINE 40 MCG/ML (10ML) SYRINGE FOR IV PUSH (FOR BLOOD PRESSURE SUPPORT)
PREFILLED_SYRINGE | INTRAVENOUS | Status: DC | PRN
  Administered 2021-09-29 (×4): 60 ug via INTRAVENOUS

## 2021-09-29 SURGICAL SUPPLY — 22 items

## 2021-09-29 NOTE — Anesthesia Procedure Notes (Signed)
Procedure Name: [place] ?Date/Time: 09/29/2021 8:03 AM ?Performed by: Mariea Clonts, CRNA ?Pre-anesthesia Checklist: Patient identified, Emergency Drugs available, Suction available, Patient being monitored and Timeout performed ?Patient Re-evaluated:Patient Re-evaluated prior to induction ?Oxygen Delivery Method: Nasal cannula ? ? ? ? ?

## 2021-09-29 NOTE — Progress Notes (Signed)
Capsule ingested at 0900. Instructions given to PACU RN to give to bedside RN. Patient understanding of instructions as well. Equipment may be removed from the patient at 9pm on 09/29/21. Endo staff will pick up equipment from bedside in AM of 3/13. ?

## 2021-09-29 NOTE — Transfer of Care (Signed)
Immediate Anesthesia Transfer of Care Note ? ?Patient: Jane [name] ? ?Procedure(s) Performed: COLONOSCOPY WITH PROPOFOL ? ?Patient Location: PACU ? ?Anesthesia Type:MAC ? ?Level of Consciousness: awake, alert  and oriented ? ?Airway & Oxygen Therapy: Patient Spontanous Breathing and Patient connected to nasal cannula oxygen ? ?Post-op Assessment: Report given to RN and Post -op Vital signs reviewed and stable ? ?Post vital signs: Reviewed and stable ? ?Last Vitals:  ?Vitals Value Taken Time  ?BP 136/46 09/29/21 0846  ?Temp 36.4 ?C 09/29/21 0845  ?Pulse 82 09/29/21 0850  ?Resp 23 09/29/21 0851  ?SpO2 100 % 09/29/21 0850  ?Vitals shown include unvalidated device data. ? ?Last Pain:  ?Vitals:  ? 09/29/21 0845  ?TempSrc:   ?PainSc: 0-No pain  ?   ? ?  ? ?Complications: No notable events documented. ?

## 2021-09-29 NOTE — Anesthesia Postprocedure Evaluation (Signed)
Anesthesia Post Note ? ?Patient: Jane [name] ? ?Procedure(s) Performed: COLONOSCOPY WITH PROPOFOL ?GIVENS CAPSULE STUDY ? ?  ? ?Patient location during evaluation: PACU ?Anesthesia Type: MAC ?Level of consciousness: awake ?Pain management: pain level controlled ?Respiratory status: spontaneous breathing ?Cardiovascular status: stable ?Postop Assessment: no apparent nausea or vomiting ?Anesthetic complications: no ? ? ?No notable events documented. ? ?Last Vitals:  ?Vitals:  ? 09/29/21 0845 09/29/21 0900  ?BP: (!) 136/46 (!) 155/56  ?Pulse: 83 82  ?Resp: 20 (!) 22  ?Temp: 36.4 ?C   ?SpO2: 99% 100%  ?  ?Last Pain:  ?Vitals:  ? 09/29/21 0900  ?TempSrc:   ?PainSc: 0-No pain  ? ? ?  ?  ?  ?  ?  ?  ? ?[name] ? ? ? ? ?

## 2021-09-29 NOTE — Op Note (Signed)
[facility] ?Patient Name: Jane [name] ?Procedure Date : 09/29/2021 ?MRN: [id number] ?Attending MD: Thornton Park MD, MD ?Date of Birth: 09/19/1945 ?CSN: [id number] ?Age: 76 ?Admit Type: Outpatient ?Procedure:                Colonoscopy ?Indications:              Hematochezia ?                          2017 sigmoid resection at [facility] for large adenoma  ?                          with villous features. ?                          2021 9 tubular adenomas removed on surveillance  ?                          colonoscopy. ?                          2021 also found to have tubulovillous adenoma in  ?                          the duodenum. ?                          2022 blood throughout the colon during colonoscopy  ?                          for GI bleed. ?                          09/26/21 blood throughout the colon with no blood in  ?                          the terminal ileum. ?                          CTA negative for bleeding source but shows  ?                          accentuated ileocolonic mesenteric vessels with  ?                          early opacification of the lower ileocolic vein. ?                          Repeat colonoscopy recommended given ongoing  ?                          bleeding after an extensive bowel prep. ?Providers:                Thornton Park MD, MD, Doristine Johns, RN,  ?                          Despina Pole, Technician, Fingal Beckner,  ?  CRNA ?Referring MD:              ?Medicines:                Monitored Anesthesia Care ?Complications:            No immediate complications. ?Estimated Blood Loss:     Estimated blood loss: none. ?Procedure:                Pre-Anesthesia Assessment: ?                          - Prior to the procedure, a History and Physical  ?                          was performed, and patient medications and  ?                          allergies were reviewed. The patient's tolerance of  ?                           previous anesthesia was also reviewed. The risks  ?                          and benefits of the procedure and the sedation  ?                          options and risks were discussed with the patient.  ?                          All questions were answered, and informed consent  ?                          was obtained. Prior Anticoagulants: The patient has  ?                          taken Coumadin (warfarin), last dose was 10 days  ?                          prior to procedure. ASA Grade Assessment: III - A  ?                          patient with severe systemic disease. After  ?                          reviewing the risks and benefits, the patient was  ?                          deemed in satisfactory condition to undergo the  ?                          procedure. ?                          After obtaining informed consent, the colonoscope  ?  was passed under direct vision. Throughout the  ?                          procedure, the patient's blood pressure, pulse, and  ?                          oxygen saturations were monitored continuously. The  ?                          [id number]-HQ190TL ([id number]) Olympus peds colonoscope was  ?                          introduced through the anus and advanced to the 12  ?                          cm into the ileum. A second forward view through  ?                          the right colon was performed. The colonoscopy was  ?                          performed without difficulty. The patient tolerated  ?                          the procedure well. The quality of the bowel  ?                          preparation was poor due to a large amount of  ?                          blood. The terminal ileum, ileocecal valve,  ?                          appendiceal orifice, and rectum were photographed. ?Scope In: 8:14:58 AM ?Scope Out: 8:39:24 AM ?Scope Withdrawal Time: 0 hours 20 minutes 27 seconds  ?Total Procedure Duration: 0 hours 24 minutes 26 seconds   ?Findings: ?     Non-bleeding internal and external hemorrhoids were seen. ?     There was evidence of a prior end-to-side colo-colonic anastomosis in  ?     the sigmoid colon. This was patent and was characterized by healthy  ?     appearing mucosa. The anastomosis was traversed. ?     A small polyp was found in the sigmoid colon and in the ascending colon.  ?     These were not removed given the unexplained bleeding. ?     Red blood was found in the entire colon. This included both old and  ?     fresh clots. However, no localizing lesion identified. No active  ?     bleeding seen. ?     The distal ileum contained red blood and clots. However, the blood  ?     cleared proximal to 10cm. Suspect backwash. ?     The exam was otherwise without abnormality on direct and retroflexion  ?     views. ?Impression:               -  Patent end-to-side colo-colonic anastomosis,  ?                          characterized by healthy appearing mucosa. ?                          - Two small polyps were identified but not removed,  ?                          one in the sigmoid colon and one in the ascending  ?                          colon. ?                          - Fresh blood and clots present throughout the  ?                          entire examined colon and in the distal most 10 cm  ?                          of the terminal ileum. ?                          - No mucosal abnormalities identified to explain  ?                          the bleeding. ?                          - No specimens collected. ?Recommendation:           - Patient has a contact number available for  ?                          emergencies. The signs and symptoms of potential  ?                          delayed complications were discussed with the  ?                          patient. Return to normal activities tomorrow.  ?                          Written discharge instructions were provided to the  ?                          patient. ?                           - NPO. ?                          - Continue present medications. ?                          - Proceed with capsule  endoscopy. ?                          - Plan repeat CTA with any additional significant  ?                          overt GI bleeding. ?Procedure Code(s):        --- Professional --- ?                          [phone], Colonoscopy, flexible; diagnostic, including  ?                          collection of specimen(s) by brushing or washing,  ?                          when performed (separate procedure) ?Diagnosis Code(s):        --- Professional --- ?                          A30.9, Other hemorrhoids ?                          Z98.0, Intestinal bypass and anastomosis status ?                          K63.5, Polyp of colon ?                          K92.2, Gastrointestinal hemorrhage, unspecified ?                          K92.1, Melena (includes Hematochezia) ?CPT copyright 2019 [employer] Medical Association. All rights reserved. ?The codes documented in this report are preliminary and upon coder review may  ?be revised to meet current compliance requirements. ?Thornton Park MD, MD ?09/29/2021 9:03:09 AM ?This report has been signed electronically. ?Number of Addenda: 0 ?

## 2021-09-29 NOTE — Progress Notes (Addendum)
?[facility] ?Progress Note  ? ?Subjective: Seen in room. Resting quietly. HD tomorrow on schedule.     ?HGB 10.2 this AM.  ?Objective ?Vitals:  ? 09/29/21 0745 09/29/21 0845 09/29/21 0900 09/29/21 0935  ?BP: (!) 118/47 (!) 136/46 (!) 155/56 (!) 155/63  ?Pulse: 85 83 82 79  ?Resp: 17 20 (!) 22 19  ?Temp: 98.4 ?F (36.9 ?C) 97.6 ?F (36.4 ?C)  97.7 ?F (36.5 ?C)  ?TempSrc: Temporal   Oral  ?SpO2: 100% 99% 100% 100%  ?Weight:      ?Height:      ? ?General: Pleasant elderly female in no acute distress. ?Lungs: CTAB A/P without WOB.  ?Heart: RRR with S1 S2 2/6 systolic M. No R/G. SR on monitor.  ?Abdomen: Soft, non-tender, non-distended with normoactive bowel sounds. No rebound/guarding. No obvious abdominal masses. ?Lower extremities: No LE edema. ?Neuro: Alert and oriented X 3. Moves all extremities spontaneously. ?Dialysis Access: R AVF + T/B ? ?Additional Objective ?Labs: ?Basic Metabolic Panel: ?Recent Labs  ?Lab 09/25/21 ?1534 09/26/21 ?0208 09/27/21 ?1211 09/29/21 ?7622  ?NA 138 141 137 141  ?K 3.8 4.2 4.5 4.0  ?CL 100 105 98 105  ?CO2 28 23 21*  --   ?GLUCOSE 117* 89 74 94  ?BUN 17 25* 44* 38*  ?CREATININE 5.42* 7.03* 10.35* 10.00*  ?CALCIUM 9.9 9.5 9.6  --   ?PHOS  --   --  5.9*  --   ? ?Liver Function Tests: ?Recent Labs  ?Lab 09/25/21 ?1534 09/27/21 ?1211  ?AST 20  --   ?ALT 14  --   ?ALKPHOS 108  --   ?BILITOT 0.4  --   ?PROT 7.5  --   ?ALBUMIN 3.6 3.3*  ? ?No results for input(s): LIPASE, AMYLASE in the last 168 hours. ?CBC: ?Recent Labs  ?Lab 09/25/21 ?1151 09/25/21 ?1534 09/25/21 ?2306 09/26/21 ?6333 09/26/21 ?1020 09/28/21 ?1851 09/29/21 ?0617 09/29/21 ?5456  ?WBC 6.1 5.8  --  6.3  --   --   --   --   ?HGB 7.7 Repeated and verified X2.* 7.6*   < > 7.9*   < > 9.5* 9.3* 10.2*  ?HCT 23.6 Repeated and verified X2.* 24.5*   < > 25.0*   < > 30.4* 30.1* 30.0*  ?MCV 92.6 99.2  --  95.1  --   --   --   --   ?PLT 201.0 204  --  179  --   --   --   --   ? < > = values in this interval not displayed.   ? ?Blood Culture ?   ?Component Value Date/Time  ? [place], CLEAN CATCH 08/04/2016 0932  ? [place] NONE 08/04/2016 0932  ? CULT (A) 08/04/2016 0932  ?  >=100,000 COLONIES/mL LACTOBACILLUS SPECIES ?Standardized susceptibility testing for this organism is not available. ?  ? REPTSTATUS 08/05/2016 FINAL 08/04/2016 0932  ? ? ?Cardiac Enzymes: ?No results for input(s): CKTOTAL, CKMB, CKMBINDEX, TROPONINI in the last 168 hours. ?CBG: ?No results for input(s): GLUCAP in the last 168 hours. ?Iron Studies: No results for input(s): IRON, TIBC, TRANSFERRIN, FERRITIN in the last 72 hours. ?'@lablastinr3'$ @ ?Studies/Results: ?No results found. ?Medications: ? ? calcitRIOL  0.75 mcg Oral Q M,W,F-HD  ? Chlorhexidine Gluconate Cloth  6 each Topical Q0600  ? linaclotide  145 mcg Oral QAC breakfast  ? ? ? ?Dialysis Orders: Center: [facility] MWF ?3.5 hrs 180NRe 400/500 2.0K/3.0 Ca AVF ?-Heparin 2000 units IV TIW (Have already dc'd this order!) ?-Mircera  75 mcg IV q 2 weeks (last dose 50 mcg IV 09/20/2021) ?-Calcitriol 1.0 mcg PO TIW ?  ?Assessment/Plan: ? GIB-W/U per GI, no obvious source of bleeding found so far. Possible repeat colonoscopy next week. Coumadin on indefinite hold. Risk of death from GIB in ESRD patients on anticoagulation outweighs risk of CVA. Per primary/GI. ? ESRD -  Next HD 09/30/2021 on schedule. Heparin has been Dc'd here and at OP clinic.  ? Hypertension/volume  - CXR unremarkable. No evidence of overt volume excess by exam. EDW recently raised D/T hypotension/dizziness. May need to increase EDW more. So far has tolerated HD without hypotensive episode.Net UF 1787 ml Post wt 82 kg. Having issues at end of tx with hypotension. Add UFP 2. UF as tolerated. On midodrine on HD days but slightly hypertensive yesterday on HD. Holding for now.  ? Anemia  - HGB 10.4 this AM. S/P 1 units of PRBCs 09/27/2021. HGB back down to 7.1 and transfused 2nd unit PRBCs 09/28/2021. Recent ESA at OP center. Follow HGB.  ? Metabolic  bone disease -  Continue binders, VDRA. Corrected Ca+ 10.6. Change to 2.0 Ca bath.  ? Nutrition - Renal diet with fld restrictions. Albumin 3.3. Add protein supps.  ?H/O PAF-Coumadin on hold. Being evaluated for Watchman. Upcoming appt with Dr. Burt Knack. SR on monitor.  ?  ? ?Alika Saladin H. Annisa Mazzarella NP-C ?09/29/2021, 11:37 AM  ?[facility] ?[phone] ? ? ?  ? ?

## 2021-09-29 NOTE — Anesthesia Preprocedure Evaluation (Signed)
Anesthesia Evaluation  ?Patient identified by MRN, date of birth, ID band ?Patient awake ? ? ? ?Reviewed: ?Allergy & Precautions, NPO status , Patient's Chart, lab work & pertinent test results ? ?Airway ?Mallampati: II ? ?TM Distance: >3 FB ? ? ? ? Dental ?  ?Pulmonary ?shortness of breath, asthma , sleep apnea ,  ?  ?breath sounds clear to auscultation ? ? ? ? ? ? Cardiovascular ?hypertension, + dysrhythmias + Valvular Problems/Murmurs  ?Rhythm:Regular Rate:Normal ? ? ?  ?Neuro/Psych ? Headaches,   ? GI/Hepatic ?Neg liver ROS, PUD, GERD  ,  ?Endo/Other  ?negative endocrine ROS ? Renal/GU ?Renal disease  ? ?  ?Musculoskeletal ? ? Abdominal ?  ?Peds ? Hematology ?  ?Anesthesia Other Findings ? ? Reproductive/Obstetrics ? ?  ? ? ? ? ? ? ? ? ? ? ? ? ? ?  ?  ? ? ? ? ? ? ? ? ?Anesthesia Physical ?Anesthesia Plan ? ?ASA: 3 ? ?Anesthesia Plan: MAC  ? ?Post-op Pain Management:   ? ?Induction: Intravenous ? ?PONV Risk Score and Plan: 2 and Treatment may vary due to age or medical condition, Propofol infusion, Midazolam and Ondansetron ? ?Airway Management Planned: Simple Face Mask and Nasal Cannula ? ?Additional Equipment:  ? ?Intra-op Plan:  ? ?Post-operative Plan:  ? ?Informed Consent: I have reviewed the patients History and Physical, chart, labs and discussed the procedure including the risks, benefits and alternatives for the proposed anesthesia with the patient or authorized representative who has indicated his/her understanding and acceptance.  ? ? ? ?Dental advisory given ? ?Plan Discussed with: CRNA and Anesthesiologist ? ?Anesthesia Plan Comments:   ? ? ? ? ? ? ?Anesthesia Quick Evaluation ? ?

## 2021-09-29 NOTE — Interval H&P Note (Signed)
History and Physical Interval Note: ? ?09/29/2021 ?7:33 AM ? ?Jane [name]  has presented today for surgery, with the diagnosis of GI bleed.  The various methods of treatment have been discussed with the patient and family. After consideration of risks, benefits and other options for treatment, the patient has consented to  Procedure(s): ?COLONOSCOPY WITH PROPOFOL (N/A) as a surgical intervention.  The patient's history has been reviewed, patient examined, no change in status, stable for surgery.  I have reviewed the patient's chart and labs.  Questions were answered to the patient's satisfaction.   ? ? ?[place] ? ? ?

## 2021-09-29 NOTE — Progress Notes (Signed)
PROGRESS NOTE    Jane [name]  MOQ:[id number] DOB: Jun 01, 1946 DOA: 09/25/2021 PCP: [name], [name], MD    Brief Narrative:  76 year old with history of sigmoid colectomy secondary to perforated diverticulitis in 2017, ESRD on hemodialysis Monday Wednesday Friday, paroxysmal A-fib on Coumadin, history of multiple GI bleeding and vascular malformations, history of sleep apnea not on CPAP presented with several weeks of melena and drop in hemoglobin called by dialysis center after routine blood draw.  She has been noted to have melena for at least 4 weeks now.  Occasionally becoming dizzy after dialysis sessions.  She has been holding her Coumadin since 3/3 secondary to high INR.  Seen by Coumadin clinic and was advised to discontinue Coumadin and she is planned to be referred for Watchman device.  She was scheduled for colonoscopy by GI as outpatient on 3/14, however due to drop in hemoglobin needed to come to the hospital.  Underwent multiple GI procedures.   Assessment & Plan:   Acute lower GI bleeding, anemia of acute blood loss: With history of extensive recurrent GI bleeding in the past.  Currently off Coumadin.  INR stable. Baseline hemoglobin 9.6, 1 week ago. Received total 2 units of PRBC with appropriate response.  Hemoglobin more than 9 today.  Continue to monitor every 12 hours.   3/9, EGD with no evidence of bleeding. 3/9, colonoscopy and enteroscopy with no source of bleeding, large pool of fresh blood in the ileocolic area.  Patient continued to have hematochezia. CT angiogram with no active bleeding. 3/12, repeat colonoscopy and enteroscopy with no source of bleeding, plenty of fresh blood and old blood entire colon.  Capsule endoscopy ingested today.  Continue to monitor.  ESRD on hemodialysis: Receiving dialysis on her schedule.  Paroxysmal A-fib: Currently rate controlled.  Not on any medications.  Cardiology plan to keep off anticoagulation.  They will follow-up.   DVT  prophylaxis: SCDs Start: 09/26/21 0102   Code Status: Full code Family Communication: Daughter on the phone Disposition Plan: Status is: Inpatient Remains inpatient appropriate because: Inpatient procedure planned.  Ongoing lower GI bleed.     Consultants:  Gastroenterology Nephrology, will notify  Procedures:  Upper GI endoscopy, colonoscopy 3/9 Hemodialysis on reschedule Colonoscopy 3/12  Antimicrobials:  None   Subjective:  Patient seen and examined.  Came back from colonoscopy.  No complaints.  Denies any nausea vomiting abdominal pain or distention.  Daughter on the phone.  Called and updated.  Objective: Vitals:   09/29/21 0745 09/29/21 0845 09/29/21 0900 09/29/21 0935  BP: (!) 118/47 (!) 136/46 (!) 155/56 (!) 155/63  Pulse: 85 83 82 79  Resp: 17 20 (!) 22 19  Temp: 98.4 F (36.9 C) 97.6 F (36.4 C)  97.7 F (36.5 C)  TempSrc: Temporal   Oral  SpO2: 100% 99% 100% 100%  Weight:      Height:        Intake/Output Summary (Last 24 hours) at 09/29/2021 1038 Last data filed at 09/29/2021 0838 Gross per 24 hour  Intake 540 ml  Output --  Net 540 ml   Filed Weights   09/26/21 1943 09/27/21 1310 09/27/21 1618  Weight: 83.6 kg 83.8 kg 82 kg    Examination:  General: Looks fairly comfortable. Cardiovascular: S1-S2 normal.  Regular rate rhythm. Respiratory: Bilateral clear. Gastrointestinal: Soft.  Nontender.  Bowel sound present. Ext: AV fistula left upper arm.  Thrill present. Neuro: Intact. Musculoskeletal: No deformities.      Data Reviewed: I have personally  reviewed following labs and imaging studies  CBC: Recent Labs  Lab 09/25/21 1151 09/25/21 1534 09/25/21 2306 09/26/21 0208 09/26/21 1020 09/28/21 0457 09/28/21 1437 09/28/21 1851 09/29/21 0617 09/29/21 0658  WBC 6.1 5.8  --  6.3  --   --   --   --   --   --   HGB 7.7 Repeated and verified X2.* 7.6*   < > 7.9*   < > 7.1* 9.3* 9.5* 9.3* 10.2*  HCT 23.6 Repeated and verified X2.*  24.5*   < > 25.0*   < > 21.9* 30.5* 30.4* 30.1* 30.0*  MCV 92.6 99.2  --  95.1  --   --   --   --   --   --   PLT 201.0 204  --  179  --   --   --   --   --   --    < > = values in this interval not displayed.   Basic Metabolic Panel: Recent Labs  Lab 09/25/21 1534 09/26/21 0208 09/27/21 1211 09/29/21 0658  NA 138 141 137 141  K 3.8 4.2 4.5 4.0  CL 100 105 98 105  CO2 28 23 21*  --   GLUCOSE 117* 89 74 94  BUN 17 25* 44* 38*  CREATININE 5.42* 7.03* 10.35* 10.00*  CALCIUM 9.9 9.5 9.6  --   PHOS  --   --  5.9*  --    GFR: Estimated Creatinine Clearance: 5 mL/min (A) (by C-G formula based on SCr of 10 mg/dL (H)). Liver Function Tests: Recent Labs  Lab 09/25/21 1534 09/27/21 1211  AST 20  --   ALT 14  --   ALKPHOS 108  --   BILITOT 0.4  --   PROT 7.5  --   ALBUMIN 3.6 3.3*   No results for input(s): LIPASE, AMYLASE in the last 168 hours. No results for input(s): AMMONIA in the last 168 hours. Coagulation Profile: Recent Labs  Lab 09/24/21 1023 09/25/21 1654  INR 1.9* 1.2   Cardiac Enzymes: No results for input(s): CKTOTAL, CKMB, CKMBINDEX, TROPONINI in the last 168 hours. BNP (last 3 results) No results for input(s): PROBNP in the last 8760 hours. HbA1C: No results for input(s): HGBA1C in the last 72 hours. CBG: No results for input(s): GLUCAP in the last 168 hours. Lipid Profile: No results for input(s): CHOL, HDL, LDLCALC, TRIG, CHOLHDL, LDLDIRECT in the last 72 hours. Thyroid Function Tests: No results for input(s): TSH, T4TOTAL, FREET4, T3FREE, THYROIDAB in the last 72 hours. Anemia Panel: No results for input(s): VITAMINB12, FOLATE, FERRITIN, TIBC, IRON, RETICCTPCT in the last 72 hours. Sepsis Labs: No results for input(s): PROCALCITON, LATICACIDVEN in the last 168 hours.  Recent Results (from the past 240 hour(s))  Resp Panel by RT-PCR (Flu A&B, Covid) Nasopharyngeal Swab     Status: None   Collection Time: 09/25/21  3:32 PM   Specimen:  Nasopharyngeal Swab; Nasopharyngeal(NP) swabs in vial transport medium  Result Value Ref Range Status   SARS Coronavirus 2 by RT PCR NEGATIVE NEGATIVE Final    Comment: (NOTE) SARS-CoV-2 target nucleic acids are NOT DETECTED.  The SARS-CoV-2 RNA is generally detectable in upper respiratory specimens during the acute phase of infection. The lowest concentration of SARS-CoV-2 viral copies this assay can detect is 138 copies/mL. A negative result does not preclude SARS-Cov-2 infection and should not be used as the sole basis for treatment or other patient management decisions. A negative result may occur with  improper specimen collection/handling, submission of specimen other than nasopharyngeal swab, presence of viral mutation(s) within the areas targeted by this assay, and inadequate number of viral copies(<138 copies/mL). A negative result must be combined with clinical observations, patient history, and epidemiological information. The expected result is Negative.  Fact Sheet for Patients:  EntrepreneurPulse.com.au  Fact Sheet for Healthcare Providers:  IncredibleEmployment.be  This test is no t yet approved or cleared by the [place] FDA and  has been authorized for detection and/or diagnosis of SARS-CoV-2 by FDA under an Emergency Use Authorization (EUA). This EUA will remain  in effect (meaning this test can be used) for the duration of the COVID-19 declaration under Section 564(b)(1) of the Act, 21 U.S.C.section 360bbb-3(b)(1), unless the authorization is terminated  or revoked sooner.       Influenza A by PCR NEGATIVE NEGATIVE Final   Influenza B by PCR NEGATIVE NEGATIVE Final    Comment: (NOTE) The Xpert Xpress SARS-CoV-2/FLU/RSV plus assay is intended as an aid in the diagnosis of influenza from Nasopharyngeal swab specimens and should not be used as a sole basis for treatment. Nasal washings and aspirates are unacceptable for  Xpert Xpress SARS-CoV-2/FLU/RSV testing.  Fact Sheet for Patients: EntrepreneurPulse.com.au  Fact Sheet for Healthcare Providers: IncredibleEmployment.be  This test is not yet approved or cleared by the [place] FDA and has been authorized for detection and/or diagnosis of SARS-CoV-2 by FDA under an Emergency Use Authorization (EUA). This EUA will remain in effect (meaning this test can be used) for the duration of the COVID-19 declaration under Section 564(b)(1) of the Act, 21 U.S.C. section 360bbb-3(b)(1), unless the authorization is terminated or revoked.  Performed at [facility], [place] [address], [place], [place] [address]          Radiology Studies: No results found.      Scheduled Meds: Continuous Infusions:   LOS: 4 days    Time spent: 35 minutes    Barb Merino, MD [facility] Pager [phone]

## 2021-09-30 DIAGNOSIS — K922 Gastrointestinal hemorrhage, unspecified: Secondary | ICD-10-CM | POA: Diagnosis not present

## 2021-09-30 LAB — HEMOGLOBIN AND HEMATOCRIT, BLOOD
HCT: 29.2 % — ABNORMAL LOW (ref 36.0–46.0)
HCT: 25.4 % — ABNORMAL LOW (ref 36.0–46.0)
Hemoglobin: 9.1 g/dL — ABNORMAL LOW (ref 12.0–15.0)
Hemoglobin: 8.5 g/dL — ABNORMAL LOW (ref 12.0–15.0)

## 2021-09-30 MED ORDER — METOCLOPRAMIDE HCL 5 MG/ML IJ SOLN
10.0000 mg | Freq: Four times a day (QID) | INTRAMUSCULAR | Status: AC
  Administered 2021-09-30 (×2): 10 mg via INTRAVENOUS
  Filled 2021-09-30 (×2): qty 2

## 2021-09-30 MED ORDER — BISACODYL 5 MG PO TBEC
20.0000 mg | DELAYED_RELEASE_TABLET | Freq: Once | ORAL | Status: AC
  Administered 2021-09-30: 20 mg via ORAL
  Filled 2021-09-30: qty 4

## 2021-09-30 MED ORDER — PEG-KCL-NACL-NASULF-NA ASC-C 100 G PO SOLR
0.5000 | Freq: Once | ORAL | Status: AC
Start: 2021-09-30 — End: 2021-09-30
  Administered 2021-09-30: 100 g via ORAL
  Filled 2021-09-30: qty 1

## 2021-09-30 MED ORDER — PEG-KCL-NACL-NASULF-NA ASC-C 100 G PO SOLR
0.5000 | Freq: Once | ORAL | Status: AC
  Administered 2021-09-30: 100 g via ORAL
  Filled 2021-09-30: qty 1

## 2021-09-30 MED ORDER — FAMOTIDINE 20 MG PO TABS
20.0000 mg | ORAL_TABLET | Freq: Every day | ORAL | Status: DC
  Administered 2021-09-30 – 2021-10-01 (×2): 20 mg via ORAL
  Filled 2021-09-30 (×2): qty 1

## 2021-09-30 MED ORDER — PEG-KCL-NACL-NASULF-NA ASC-C 100 G PO SOLR: 1.0000 | Freq: Once | ORAL | Status: DC

## 2021-09-30 MED ORDER — PANTOPRAZOLE SODIUM 40 MG PO TBEC
40.0000 mg | DELAYED_RELEASE_TABLET | Freq: Every day | ORAL | Status: DC
  Administered 2021-10-02: 40 mg via ORAL
  Filled 2021-09-30: qty 1

## 2021-09-30 NOTE — Assessment & Plan Note (Signed)
--   Continue monitoring of hemoglobin/hematocrit with plan of repeat colonoscopy tomorrow

## 2021-09-30 NOTE — Assessment & Plan Note (Addendum)
Transfuse 1 unit PRBC on 3/8 followed by 3/11.  Hemoglobin 7.5 on discharge; and patient asymptomatic without dizziness or feeling of fatigue.  Recommend repeat hemoglobin at next HD session.

## 2021-09-30 NOTE — Assessment & Plan Note (Signed)
--  Protonix 40 mg PO daily

## 2021-09-30 NOTE — Hospital Course (Signed)
Jane [name] is a 76 year old female with past medical history significant for paroxysmal atrial fibrillation on Coumadin, ESRD on HD MWF, history of perforated diverticulitis s/p sigmoid colectomy, OSA not on CPAP, history of recurrent GI bleeding and vascular malformations who presented to [facility] ED on 3/8 with reported several weeks of melena and a drop in hemoglobin noted by HD center on routine blood draw.  Patient reports onset of melena x4 weeks, occasional dizziness after dialysis.  She has been holding her Coumadin since 3/3 secondary to high INR.  She was seen by the Coumadin clinic outpatient was advised to discontinue her Coumadin and if she is plan to be referred for [facility] device.  She was planned for colonoscopy outpatient on 3/14, however due to acute drop in hemoglobin she presented to the ED for further evaluation. ? ?In the ED, temperature 98.4 ?F, BP 155/54, HR 95, RR 16, SPO2 100% on room air.  Sodium 138, potassium 3.8, chloride 100, CO2 28, glucose 117, BUN 17, creatinine 5.42.  AST 20, ALT 14, total bilirubin 0.4.  WBC 5.8, hemoglobin 7.6, platelets 204.  MCV 99.2.  COVID-19 PCR negative.  Influenza A/B PCR negative.  Chest x-ray with no active cardiopulmonary disease process.  1 unit PRBC ordered by EDP.  [place] gastroenterology consulted.  [facility] consulted for further evaluation and management of GI bleed and symptomatic anemia. ?

## 2021-09-30 NOTE — Progress Notes (Signed)
Subjective: Only complains of diet, f 2 g sodium will change to renal diet /for dialysis today on schedule ? ?Objective ?Vital signs in last 24 hours: ?Vitals:  ? 09/29/21 2110 09/30/21 0051 09/30/21 0325 09/30/21 0739  ?BP: (!) 145/61 (!) 145/61 (!) 119/46 (!) 135/54  ?Pulse: 78 78 74 79  ?Resp: '18 16 16 19  '$ ?Temp: 98.3 ?F (36.8 ?C) 98.3 ?F (36.8 ?C) 98.3 ?F (36.8 ?C) (!) 97.5 ?F (36.4 ?C)  ?TempSrc: Axillary Oral Oral Oral  ?SpO2: 100% 99% 99%   ?Weight:      ?Height:      ? ?Weight change:  ? ?Physical Exam: ?General: Alert pleasant elderly female NAD ?Heart: RRR no MRG ?Lungs: CTA nonlabored breathing ?Abdomen: NABS obese NTND ?Extremities: Trace bipedal edema ?Dialysis Access: Right arm AV fistula positive bruit ? ?Dialysis Orders: Center: [facility] MWF ?3.5 hrs 180NRe 400/500 2.0K/3.0 Ca AVF ?-Heparin 2000 units IV TIW (Have already dc'd this order!) ?-Mircera 75 mcg IV q 2 weeks (last dose 50 mcg IV 09/20/2021) ?-Calcitriol 1.0 mcg PO TIW ?  ?Problem/Plan: ? GIB-W/U per GI, no obvious source of bleeding found so far. Possible repeat colonoscopy next week. Coumadin on indefinite hold.  Per primary/GI. ? ESRD -MWF HD , HD today on schedule. Heparin has been Dc'd here and at OP clinic.  ? Hypertension/volume  - No evidence of overt volume excess  EDW recently raised D/T hypotension/dizziness. May need to increase EDW more. So far has tolerated HD without hypotensive episode.Net UF 1787 ml Post wt 82 kg. Having issues at end of tx with hypotension. Add UFP 2. UF as tolerated. On midodrine on HD days but slightly hypertensive last HD on HD. Holding for now.  ? Anemia  - HGB  9.1 this AM.  Has been given 2 units S/P units of PRBCs Recent ESA at OP center. Follow HGB.  ? Metabolic bone disease -  Continue binders, VDRA. Corrected Ca+ 10.6. Change to 2.0 Ca bath.  ? Nutrition - Renal diet with fld restrictions. Albumin 3.3. Add protein supps.  ?H/O PAF-Coumadin on hold. Being evaluated for Watchman. Upcoming appt with  Dr. Burt Knack. SR on monitor. ? ? ? ? ?Ernest Haber, PA-C ?[facility] ?Beeper [phone] ?09/30/2021,8:30 AM ? LOS: 5 days  ? ?Labs: ?Basic Metabolic Panel: ?Recent Labs  ?Lab 09/25/21 ?1534 09/26/21 ?0208 09/27/21 ?1211 09/29/21 ?9147  ?NA 138 141 137 141  ?K 3.8 4.2 4.5 4.0  ?CL 100 105 98 105  ?CO2 28 23 21*  --   ?GLUCOSE 117* 89 74 94  ?BUN 17 25* 44* 38*  ?CREATININE 5.42* 7.03* 10.35* 10.00*  ?CALCIUM 9.9 9.5 9.6  --   ?PHOS  --   --  5.9*  --   ? ?Liver Function Tests: ?Recent Labs  ?Lab 09/25/21 ?1534 09/27/21 ?1211  ?AST 20  --   ?ALT 14  --   ?ALKPHOS 108  --   ?BILITOT 0.4  --   ?PROT 7.5  --   ?ALBUMIN 3.6 3.3*  ? ?No results for input(s): LIPASE, AMYLASE in the last 168 hours. ?No results for input(s): AMMONIA in the last 168 hours. ?CBC: ?Recent Labs  ?Lab 09/25/21 ?1151 09/25/21 ?1534 09/25/21 ?2306 09/26/21 ?8295 09/26/21 ?1020 09/29/21 ?0658 09/29/21 ?1811 09/30/21 ?0456  ?WBC 6.1 5.8  --  6.3  --   --   --   --   ?HGB 7.7 Repeated and verified X2.* 7.6*   < > 7.9*   < > 10.2*  9.2* 9.1*  ?HCT 23.6 Repeated and verified X2.* 24.5*   < > 25.0*   < > 30.0* 29.3* 29.2*  ?MCV 92.6 99.2  --  95.1  --   --   --   --   ?PLT 201.0 204  --  179  --   --   --   --   ? < > = values in this interval not displayed.  ? ?Cardiac Enzymes: ?No results for input(s): CKTOTAL, CKMB, CKMBINDEX, TROPONINI in the last 168 hours. ?CBG: ?No results for input(s): GLUCAP in the last 168 hours. ? ?Studies/Results: ?No results found. ?Medications: ? ? calcitRIOL  0.75 mcg Oral Q M,W,F-HD  ? Chlorhexidine Gluconate Cloth  6 each Topical Q0600  ? linaclotide  145 mcg Oral QAC breakfast  ? ? ? ? ?

## 2021-09-30 NOTE — Progress Notes (Signed)
PROGRESS NOTE    Jane [name]  PIR:[id number] DOB: Dec 16, 1945 DOA: 09/25/2021 PCP: [name], [name], MD    Brief Narrative:  Jane [name] is a 76 year old female with past medical history significant for paroxysmal atrial fibrillation on Coumadin, ESRD on HD MWF, history of perforated diverticulitis s/p sigmoid colectomy, OSA not on CPAP, history of recurrent GI bleeding and vascular malformations who presented to [facility] ED on 3/8 with reported several weeks of melena and a drop in hemoglobin noted by HD center on routine blood draw.  Patient reports onset of melena x4 weeks, occasional dizziness after dialysis.  She has been holding her Coumadin since 3/3 secondary to high INR.  She was seen by the Coumadin clinic outpatient was advised to discontinue her Coumadin and if she is plan to be referred for [facility] device.  She was planned for colonoscopy outpatient on 3/14, however due to acute drop in hemoglobin she presented to the ED for further evaluation.  In the ED, temperature 98.4 F, BP 155/54, HR 95, RR 16, SPO2 100% on room air.  Sodium 138, potassium 3.8, chloride 100, CO2 28, glucose 117, BUN 17, creatinine 5.42.  AST 20, ALT 14, total bilirubin 0.4.  WBC 5.8, hemoglobin 7.6, platelets 204.  MCV 99.2.  COVID-19 PCR negative.  Influenza A/B PCR negative.  Chest x-ray with no active cardiopulmonary disease process.  1 unit PRBC ordered by EDP.  [place] [facility] consulted.  [facility] consulted for further evaluation and management of GI bleed and symptomatic anemia.    Assessment & Plan:   Assessment and Plan: * Acute GI bleeding Patient presenting to ED with 4-week history of melena and hemoglobin now dropped 2 g to 7.7.  INR 1.2.  History of recurrent GI bleeds in the past complicated by use of Coumadin outpatient.  Coumadin has now been on hold since 3/3. --[facility] following, appreciate assistance --Hgb 7.7>>9.0>>7.1>>9.5>10.2>9.2>9.1 --s/p 2u pRBC on 3/8 and 3/11 --Colonoscopy 3/9:  Blood throughout colon, no source identified, nonbleeding external/internal hemorrhoids, small polyp sigmoid colon --Small bowel enteroscopy 3/9: Normal esophagus, multiple gastric polyps with 1 polyp evidence for recent bleeding s/p resection, nodular mucosa to duodenum s/p biopsy, no evidence for recent significant GI bleeding. --CTA GI Bleed 3/10: No evidence of active GI bleeding --Colonoscopy 3/12: Noted end to side colocolonic anastomosis, 2 small polyps sigmoid colon and ascending colon, fresh blood and clots throughout entire examined colon and distalmost 10 cm terminal ileum. --Capsule endoscopy 3/13: Evidence of active oozing right colon or possible terminal ileum --GI plans repeat colonoscopy tomorrow --Clear liquid diet, bowel prep --NPO after MN   ESRD on hemodialysis [facility]) -- Nephrology following for continued HD while inpatient   Paroxysmal atrial fibrillation ([place]) Coumadin has been held since 3/3.  Continue to hold.  INR is subtherapeutic at 1.2.  Patient has followed with cardiology outpatient and anticoagulation has been held indefinitely with referral for potential Watchman device  Hematochezia -- Continue monitoring of hemoglobin/hematocrit with plan of repeat colonoscopy tomorrow  Gastritis and gastroduodenitis --Protonix 40 mg PO daily  Symptomatic anemia --s/p 2u pRBC on 3/8 and 3/11 --Hemoglobin/hematocrit q12h --Transfuse for hemoglobin < 7.0 or active bleeding    DVT prophylaxis: SCDs Start: 09/26/21 0102    Code Status: Full Code Family Communication: No family present at bedside this morning  Disposition Plan:  Level of care: Progressive Status is: Inpatient Remains inpatient appropriate because: GI plans repeat colonoscopy in the a.m.    Consultants:  [place] [facility]  Procedures:  Small bowel enteroscopy 3/9 Colonoscopy 3/9 Colonoscopy 3/12  Antimicrobials:  None   Subjective: Patient seen examined bedside, resting  comfortably.  Eating breakfast.  No specific complaints or questions at this time.  Denies any current blood in stool.  States her cardiologist told her to discontinue all Coumadin use given her recurrent bleeding.  Denies headache, no dizziness, no chest pain, palpitations, no shortness of breath, no fever/chills/night sweats, no nausea/vomiting/diarrhea, no weakness, no fatigue, no paresthesias.  No acute events overnight per nurse staff.  Seen by GI, plan for repeat colonoscopy tomorrow given oozing blood noted on capsule endoscopy today.  Objective: Vitals:   09/30/21 1130 09/30/21 1200 09/30/21 1230 09/30/21 1300  BP: (!) 119/53 131/61 (!) 141/57 126/69  Pulse: 74 76 84 95  Resp: $Remo'18 18 18 20  'MKGqU$ Temp:    97.6 F (36.4 C)  TempSrc:    Oral  SpO2:    100%  Weight:    82 kg  Height:        Intake/Output Summary (Last 24 hours) at 09/30/2021 1610 Last data filed at 09/30/2021 1300 Gross per 24 hour  Intake 400 ml  Output 2500 ml  Net -2100 ml   Filed Weights   09/27/21 1618 09/30/21 0940 09/30/21 1300  Weight: 82 kg 84.4 kg 82 kg    Examination:  Physical Exam: GEN: NAD, alert and oriented x 3, wd/wn HEENT: NCAT, PERRL, EOMI, sclera clear, MMM PULM: CTAB w/o wheezes/crackles, normal respiratory effort CV: RRR w/o M/G/R GI: abd soft, NTND, NABS, no R/G/M MSK: no peripheral edema, muscle strength globally intact 5/5 bilateral upper/lower extremities NEURO: CN II-XII intact, no focal deficits, sensation to light touch intact PSYCH: normal mood/affect Integumentary: dry/intact, no rashes or wounds    Data Reviewed: I have personally reviewed following labs and imaging studies  CBC: Recent Labs  Lab 09/25/21 1151 09/25/21 1534 09/25/21 2306 09/26/21 0208 09/26/21 1020 09/28/21 1851 09/29/21 0617 09/29/21 0658 09/29/21 1811 09/30/21 0456  WBC 6.1 5.8  --  6.3  --   --   --   --   --   --   HGB 7.7 Repeated and verified X2.* 7.6*   < > 7.9*   < > 9.5* 9.3* 10.2* 9.2*  9.1*  HCT 23.6 Repeated and verified X2.* 24.5*   < > 25.0*   < > 30.4* 30.1* 30.0* 29.3* 29.2*  MCV 92.6 99.2  --  95.1  --   --   --   --   --   --   PLT 201.0 204  --  179  --   --   --   --   --   --    < > = values in this interval not displayed.   Basic Metabolic Panel: Recent Labs  Lab 09/25/21 1534 09/26/21 0208 09/27/21 1211 09/29/21 0658  NA 138 141 137 141  K 3.8 4.2 4.5 4.0  CL 100 105 98 105  CO2 28 23 21*  --   GLUCOSE 117* 89 74 94  BUN 17 25* 44* 38*  CREATININE 5.42* 7.03* 10.35* 10.00*  CALCIUM 9.9 9.5 9.6  --   PHOS  --   --  5.9*  --    GFR: Estimated Creatinine Clearance: 5 mL/min (A) (by C-G formula based on SCr of 10 mg/dL (H)). Liver Function Tests: Recent Labs  Lab 09/25/21 1534 09/27/21 1211  AST 20  --   ALT 14  --   ALKPHOS 108  --  BILITOT 0.4  --   PROT 7.5  --   ALBUMIN 3.6 3.3*   No results for input(s): LIPASE, AMYLASE in the last 168 hours. No results for input(s): AMMONIA in the last 168 hours. Coagulation Profile: Recent Labs  Lab 09/24/21 1023 09/25/21 1654  INR 1.9* 1.2   Cardiac Enzymes: No results for input(s): CKTOTAL, CKMB, CKMBINDEX, TROPONINI in the last 168 hours. BNP (last 3 results) No results for input(s): PROBNP in the last 8760 hours. HbA1C: No results for input(s): HGBA1C in the last 72 hours. CBG: No results for input(s): GLUCAP in the last 168 hours. Lipid Profile: No results for input(s): CHOL, HDL, LDLCALC, TRIG, CHOLHDL, LDLDIRECT in the last 72 hours. Thyroid Function Tests: No results for input(s): TSH, T4TOTAL, FREET4, T3FREE, THYROIDAB in the last 72 hours. Anemia Panel: No results for input(s): VITAMINB12, FOLATE, FERRITIN, TIBC, IRON, RETICCTPCT in the last 72 hours. Sepsis Labs: No results for input(s): PROCALCITON, LATICACIDVEN in the last 168 hours.  Recent Results (from the past 240 hour(s))  Resp Panel by RT-PCR (Flu A&B, Covid) Nasopharyngeal Swab     Status: None   Collection Time:  09/25/21  3:32 PM   Specimen: Nasopharyngeal Swab; Nasopharyngeal(NP) swabs in vial transport medium  Result Value Ref Range Status   SARS Coronavirus 2 by RT PCR NEGATIVE NEGATIVE Final    Comment: (NOTE) SARS-CoV-2 target nucleic acids are NOT DETECTED.  The SARS-CoV-2 RNA is generally detectable in upper respiratory specimens during the acute phase of infection. The lowest concentration of SARS-CoV-2 viral copies this assay can detect is 138 copies/mL. A negative result does not preclude SARS-Cov-2 infection and should not be used as the sole basis for treatment or other patient management decisions. A negative result may occur with  improper specimen collection/handling, submission of specimen other than nasopharyngeal swab, presence of viral mutation(s) within the areas targeted by this assay, and inadequate number of viral copies(<138 copies/mL). A negative result must be combined with clinical observations, patient history, and epidemiological information. The expected result is Negative.  Fact Sheet for Patients:  EntrepreneurPulse.com.au  Fact Sheet for Healthcare Providers:  IncredibleEmployment.be  This test is no t yet approved or cleared by the [place] FDA and  has been authorized for detection and/or diagnosis of SARS-CoV-2 by FDA under an Emergency Use Authorization (EUA). This EUA will remain  in effect (meaning this test can be used) for the duration of the COVID-19 declaration under Section 564(b)(1) of the Act, 21 U.S.C.section 360bbb-3(b)(1), unless the authorization is terminated  or revoked sooner.       Influenza A by PCR NEGATIVE NEGATIVE Final   Influenza B by PCR NEGATIVE NEGATIVE Final    Comment: (NOTE) The Xpert Xpress SARS-CoV-2/FLU/RSV plus assay is intended as an aid in the diagnosis of influenza from Nasopharyngeal swab specimens and should not be used as a sole basis for treatment. Nasal washings  and aspirates are unacceptable for Xpert Xpress SARS-CoV-2/FLU/RSV testing.  Fact Sheet for Patients: EntrepreneurPulse.com.au  Fact Sheet for Healthcare Providers: IncredibleEmployment.be  This test is not yet approved or cleared by the [place] FDA and has been authorized for detection and/or diagnosis of SARS-CoV-2 by FDA under an Emergency Use Authorization (EUA). This EUA will remain in effect (meaning this test can be used) for the duration of the COVID-19 declaration under Section 564(b)(1) of the Act, 21 U.S.C. section 360bbb-3(b)(1), unless the authorization is terminated or revoked.  Performed at [facility], [place] [address],  [place], [place] [address]          Radiology Studies: No results found.      Scheduled Meds:  bisacodyl  20 mg Oral Once   calcitRIOL  0.75 mcg Oral Q M,W,F-HD   Chlorhexidine Gluconate Cloth  6 each Topical Q0600   famotidine  20 mg Oral QHS   linaclotide  145 mcg Oral QAC breakfast   metoCLOPramide (REGLAN) injection  10 mg Intravenous Q6H   pantoprazole  40 mg Oral Daily   peg 3350 powder  0.5 kit Oral Once   And   peg 3350 powder  0.5 kit Oral Once   Continuous Infusions:   LOS: 5 days    Time spent: 46 minutes spent on chart review, discussion with nursing staff, consultants, updating family and interview/physical exam; more than 50% of that time was spent in counseling and/or coordination of care.    Adael Culbreath J [place] DO [facility] Available via Epic secure chat 7am-7pm After these hours, please refer to coverage provider listed on [email or web address] 09/30/2021, 4:10 PM

## 2021-09-30 NOTE — H&P (View-Only) (Signed)
? ?       Daily Rounding Note ? ?09/30/2021, 8:37 AM ? LOS: 5 days  ? Attending physician's note  ? ?I have taken a history, reviewed the chart and examined the patient. I performed a substantive portion of this encounter, including complete performance of at least one of the Nguyen components, in conjunction with the APP. I agree with the APP's note, impression and recommendations.  ?  ?Small bowel video capsule with evidence of active oozing in the right colon or possible terminal ileum, unable to determine the exact source of bleeding ?We will plan for repeat colonoscopy tomorrow a.m. ?Clear liquids ?Bowel prep ? ?Continue to hold Coumadin ? ?Monitor hemoglobin and transfuse if below 7 ? ?I discussed plan with her daughter on the phone, she is agreeable as well ? The patient was provided an opportunity to ask questions and all were answered. The patient agreed with the plan and demonstrated an understanding of the instructions. ? ? ?K. Denzil Magnuson , MD ?[phone]    ?Chief complaint:  painless hematochezia.   ? ?SUBJECTIVE:   ? ?No BM's or passing of blood.  + back/spine stiffness.  Reminding me of her breakthrough AM reflux sxs.  Tolerating renal diet ? ?OBJECTIVE:        ? Vital signs in last 24 hours:    ?Temp:  [97.5 ?F (36.4 ?C)-98.3 ?F (36.8 ?C)] 97.5 ?F (36.4 ?C) (03/13 0739) ?Pulse Rate:  [74-83] 79 (03/13 0739) ?Resp:  [15-22] 19 (03/13 0739) ?BP: (119-155)/(46-63) 135/54 (03/13 0739) ?SpO2:  [98 %-100 %] 99 % (03/13 0325) ?Last BM Date : 09/26/21 ?Filed Weights  ? 09/26/21 1943 09/27/21 1310 09/27/21 1618  ?Weight: 83.6 kg 83.8 kg 82 kg  ? ?General: looks pale but well   ?Heart: RRR ?Chest: clear, no dyspnea ?Abdomen: soft, NT, ND.  Active BS  ?Extremities: no CCE.  R Arm AVF.   ?Neuro/Psych:  oriented x 3, no gross deficits ? ?Intake/Output from previous day: ?03/12 0701 - 03/13 0700 ?In: 295 [P.O.:400; I.V.:225] ?Out: -  ? ?Intake/Output this  shift: ?No intake/output data recorded. ? ?Lab Results: ?Recent Labs  ?  09/29/21 ?0658 09/29/21 ?1811 09/30/21 ?0456  ?HGB 10.2* 9.2* 9.1*  ?HCT 30.0* 29.3* 29.2*  ? ?BMET ?Recent Labs  ?  09/27/21 ?1211 09/29/21 ?1884  ?NA 137 141  ?K 4.5 4.0  ?CL 98 105  ?CO2 21*  --   ?GLUCOSE 74 94  ?BUN 44* 38*  ?CREATININE 10.35* 10.00*  ?CALCIUM 9.6  --   ? ?LFT ?Recent Labs  ?  09/27/21 ?1211  ?ALBUMIN 3.3*  ? ?PT/INR ?No results for input(s): LABPROT, INR in the last 72 hours. ?Hepatitis Panel ?Recent Labs  ?  09/27/21 ?1211  ?HEPBSAG NON REACTIVE  ? ? ?Studies/Results: ?No results found. ? ?Scheduled Meds: ? calcitRIOL  0.75 mcg Oral Q M,W,F-HD  ? Chlorhexidine Gluconate Cloth  6 each Topical Q0600  ? famotidine  20 mg Oral QHS  ? linaclotide  145 mcg Oral QAC breakfast  ? pantoprazole  40 mg Oral Daily  ? ?Continuous Infusions: ?PRN Meds:. ? ? ?ASSESMENT:  ? ?Hematochezia.  History large adenomatous polyp w villous features and TVA polyps of duodenum.  09/26/21 EGD: No bleeding or source for bleeding.  09/26/2021 colonoscopy #1 with blood throughout colon but not in terminal ileum.  CTA negative for active bleeding but revealing accentuated ileocolonic mesenteric vessels and early opacification lower ileocolic vein. ?09/30/2011 colonoscopy #2: Nonbleeding hemorrhoids.  Patent, healthy  appearing colocolonic anastomosis at sigmoid.  2 Small polyps at sigmoid/ascending colon not removed.  Extensive fresh and old blood seen without identification culprit lesion/active bleeding.  Blood, clots in distal ileum suspect due to backwash. ?09/29/21 placement of capsule endoscopy.  ? ? Anemia.  2 PRBCs so far.  Mircera per renal ? ?  ESRD.  MWF HD.   ? ?  PAF.  Coumadin on hold w Dr Irven Shelling approval, ? Will it ever be restarted? Plans in place for referral re Watchman procedure.  INR subtherapeutic at admission.   ? ?  GERD.  On Protonix 40 mg daily vs bid at home (rx bid but pt not sure if she takes 1 x or 2 x daily) but often w  breakthrough AM sxs of reflux/waterbrash/nausea.   ? ?  Chronic constipation.  Linzess q 2 to 3 days, O/w does not have BM.   ? ? ? ?PLAN  ? ?  VCE to be read this afternoon.  depending on findings, home after that?  ? ?  Repeat CTA in setting of recurrent signif bleeding.   ? ?  Add pepcid at HS. ? ? ? ?Azucena Freed  09/30/2021, 8:37 AM ?Phone [phone]  ?

## 2021-09-30 NOTE — Progress Notes (Addendum)
? ?       Daily Rounding Note ? ?09/30/2021, 8:37 AM ? LOS: 5 days  ? Attending physician's note  ? ?I have taken a history, reviewed the chart and examined the patient. I performed a substantive portion of this encounter, including complete performance of at least one of the [place] components, in conjunction with the APP. I agree with the APP's note, impression and recommendations.  ?  ?Small bowel video capsule with evidence of active oozing in the right colon or possible terminal ileum, unable to determine the exact source of bleeding ?We will plan for repeat colonoscopy tomorrow a.m. ?Clear liquids ?Bowel prep ? ?Continue to hold Coumadin ? ?Monitor hemoglobin and transfuse if below 7 ? ?I discussed plan with her daughter on the phone, she is agreeable as well ? The patient was provided an opportunity to ask questions and all were answered. The patient agreed with the plan and demonstrated an understanding of the instructions. ? ? ?K. Denzil Magnuson , MD ?[phone]    ?Chief complaint:  painless hematochezia.   ? ?SUBJECTIVE:   ? ?No BM's or passing of blood.  + back/spine stiffness.  Reminding me of her breakthrough AM reflux sxs.  Tolerating renal diet ? ?OBJECTIVE:        ? Vital signs in last 24 hours:    ?Temp:  [97.5 ?F (36.4 ?C)-98.3 ?F (36.8 ?C)] 97.5 ?F (36.4 ?C) (03/13 0739) ?Pulse Rate:  [74-83] 79 (03/13 0739) ?Resp:  [15-22] 19 (03/13 0739) ?BP: (119-155)/(46-63) 135/54 (03/13 0739) ?SpO2:  [98 %-100 %] 99 % (03/13 0325) ?Last BM Date : 09/26/21 ?Filed Weights  ? 09/26/21 1943 09/27/21 1310 09/27/21 1618  ?Weight: 83.6 kg 83.8 kg 82 kg  ? ?General: looks pale but well   ?Heart: RRR ?Chest: clear, no dyspnea ?Abdomen: soft, NT, ND.  Active BS  ?Extremities: no CCE.  R Arm AVF.   ?Neuro/Psych:  oriented x 3, no gross deficits ? ?Intake/Output from previous day: ?03/12 0701 - 03/13 0700 ?In: 595 [P.O.:400; I.V.:225] ?Out: -  ? ?Intake/Output this  shift: ?No intake/output data recorded. ? ?Lab Results: ?Recent Labs  ?  09/29/21 ?0658 09/29/21 ?1811 09/30/21 ?0456  ?HGB 10.2* 9.2* 9.1*  ?HCT 30.0* 29.3* 29.2*  ? ?BMET ?Recent Labs  ?  09/27/21 ?1211 09/29/21 ?6387  ?NA 137 141  ?K 4.5 4.0  ?CL 98 105  ?CO2 21*  --   ?GLUCOSE 74 94  ?BUN 44* 38*  ?CREATININE 10.35* 10.00*  ?CALCIUM 9.6  --   ? ?LFT ?Recent Labs  ?  09/27/21 ?1211  ?ALBUMIN 3.3*  ? ?PT/INR ?No results for input(s): LABPROT, INR in the last 72 hours. ?Hepatitis Panel ?Recent Labs  ?  09/27/21 ?1211  ?HEPBSAG NON REACTIVE  ? ? ?Studies/Results: ?No results found. ? ?Scheduled Meds: ? calcitRIOL  0.75 mcg Oral Q M,W,F-HD  ? Chlorhexidine Gluconate Cloth  6 each Topical Q0600  ? famotidine  20 mg Oral QHS  ? linaclotide  145 mcg Oral QAC breakfast  ? pantoprazole  40 mg Oral Daily  ? ?Continuous Infusions: ?PRN Meds:. ? ? ?ASSESMENT:  ? ?Hematochezia.  History large adenomatous polyp w villous features and TVA polyps of duodenum.  09/26/21 EGD: No bleeding or source for bleeding.  09/26/2021 colonoscopy #1 with blood throughout colon but not in terminal ileum.  CTA negative for active bleeding but revealing accentuated ileocolonic mesenteric vessels and early opacification lower ileocolic vein. ?09/30/2011 colonoscopy #2: Nonbleeding hemorrhoids.  Patent, healthy  appearing colocolonic anastomosis at sigmoid.  2 Small polyps at sigmoid/ascending colon not removed.  Extensive fresh and old blood seen without identification culprit lesion/active bleeding.  Blood, clots in distal ileum suspect due to backwash. ?09/29/21 placement of capsule endoscopy.  ? ? Anemia.  2 PRBCs so far.  Mircera per renal ? ?  ESRD.  MWF HD.   ? ?  PAF.  Coumadin on hold w Dr Irven Shelling approval, ? Will it ever be restarted? Plans in place for referral re Watchman procedure.  INR subtherapeutic at admission.   ? ?  GERD.  On Protonix 40 mg daily vs bid at home (rx bid but pt not sure if she takes 1 x or 2 x daily) but often w  breakthrough AM sxs of reflux/waterbrash/nausea.   ? ?  Chronic constipation.  Linzess q 2 to 3 days, O/w does not have BM.   ? ? ? ?PLAN  ? ?  VCE to be read this afternoon.  depending on findings, home after that?  ? ?  Repeat CTA in setting of recurrent signif bleeding.   ? ?  Add pepcid at HS. ? ? ? ?Azucena Freed  09/30/2021, 8:37 AM ?Phone [phone]  ?

## 2021-10-01 ENCOUNTER — Inpatient Hospital Stay (HOSPITAL_COMMUNITY): Payer: Medicare Other | Admitting: Anesthesiology

## 2021-10-01 ENCOUNTER — Encounter (HOSPITAL_COMMUNITY): Payer: Self-pay | Admitting: Family Medicine

## 2021-10-01 ENCOUNTER — Encounter (HOSPITAL_COMMUNITY): Admission: RE | Payer: Self-pay | Source: Ambulatory Visit

## 2021-10-01 ENCOUNTER — Encounter (HOSPITAL_COMMUNITY)
Admission: EM | Disposition: A | Discharge status: Home / Self Care | Payer: Self-pay | Source: Home / Self Care | Attending: Internal Medicine

## 2021-10-01 ENCOUNTER — Ambulatory Visit (HOSPITAL_COMMUNITY): Admission: RE | Admit: 2021-10-01 | Payer: Medicare Other | Source: Ambulatory Visit | Admitting: Gastroenterology

## 2021-10-01 DIAGNOSIS — K648 Other hemorrhoids: Secondary | ICD-10-CM

## 2021-10-01 DIAGNOSIS — I10 Essential (primary) hypertension: Secondary | ICD-10-CM

## 2021-10-01 DIAGNOSIS — K922 Gastrointestinal hemorrhage, unspecified: Secondary | ICD-10-CM | POA: Diagnosis not present

## 2021-10-01 DIAGNOSIS — K635 Polyp of colon: Secondary | ICD-10-CM

## 2021-10-01 DIAGNOSIS — K5521 Angiodysplasia of colon with hemorrhage: Secondary | ICD-10-CM

## 2021-10-01 HISTORY — PX: HOT HEMOSTASIS: SHX5433

## 2021-10-01 HISTORY — PX: COLONOSCOPY WITH PROPOFOL: SHX5780

## 2021-10-01 HISTORY — PX: POLYPECTOMY: SHX5525

## 2021-10-01 LAB — SURGICAL PATHOLOGY

## 2021-10-01 LAB — HEMOGLOBIN AND HEMATOCRIT, BLOOD
HCT: 23.8 % — ABNORMAL LOW (ref 36.0–46.0)
Hemoglobin: 7.7 g/dL — ABNORMAL LOW (ref 12.0–15.0)

## 2021-10-01 SURGERY — COLONOSCOPY WITH PROPOFOL
Anesthesia: Monitor Anesthesia Care

## 2021-10-01 MED ORDER — SODIUM CHLORIDE 0.9 % IV SOLN: INTRAVENOUS | Status: DC

## 2021-10-01 MED ORDER — PHENYLEPHRINE 40 MCG/ML (10ML) SYRINGE FOR IV PUSH (FOR BLOOD PRESSURE SUPPORT)
PREFILLED_SYRINGE | INTRAVENOUS | Status: DC | PRN
  Administered 2021-10-01: 80 ug via INTRAVENOUS
  Administered 2021-10-01: 120 ug via INTRAVENOUS

## 2021-10-01 MED ORDER — SEVELAMER CARBONATE 800 MG PO TABS
800.0000 mg | ORAL_TABLET | Freq: Three times a day (TID) | ORAL | Status: DC
  Administered 2021-10-01 – 2021-10-02 (×4): 800 mg via ORAL
  Filled 2021-10-01 (×4): qty 1

## 2021-10-01 MED ORDER — PROPOFOL 10 MG/ML IV BOLUS
INTRAVENOUS | Status: DC | PRN
  Administered 2021-10-01: 30 mg via INTRAVENOUS

## 2021-10-01 MED ORDER — PROPOFOL 500 MG/50ML IV EMUL
INTRAVENOUS | Status: DC | PRN
Start: 2021-10-01 — End: 2021-10-01
  Administered 2021-10-01: 150 ug/kg/min via INTRAVENOUS

## 2021-10-01 MED ORDER — ONDANSETRON HCL 4 MG/2ML IJ SOLN
INTRAMUSCULAR | Status: DC | PRN
  Administered 2021-10-01: 4 mg via INTRAVENOUS

## 2021-10-01 SURGICAL SUPPLY — 22 items

## 2021-10-01 NOTE — Transfer of Care (Signed)
Immediate Anesthesia Transfer of Care Note ? ?Patient: Jane [name] ? ?Procedure(s) Performed: COLONOSCOPY WITH PROPOFOL ?HOT HEMOSTASIS (ARGON PLASMA COAGULATION/BICAP) ?POLYPECTOMY ? ?Patient Location: PACU ? ?Anesthesia Type:MAC ? ?Level of Consciousness: awake, alert  and oriented ? ?Airway & Oxygen Therapy: Patient Spontanous Breathing and Patient connected to face mask oxygen ? ?Post-op Assessment: Report given to RN and Post -op Vital signs reviewed and stable ? ?Post vital signs: Reviewed and stable ? ?Last Vitals:  ?Vitals Value Taken Time  ?BP 117/49 10/01/21 1037  ?Temp 36.8 ?C 10/01/21 1035  ?Pulse 79 10/01/21 1035  ?Resp 25 10/01/21 1038  ?SpO2 100 % 10/01/21 1035  ?Vitals shown include unvalidated device data. ? ?Last Pain:  ?Vitals:  ? 10/01/21 0803  ?TempSrc: Temporal  ?PainSc: 6   ?   ? ?  ? ?Complications: No notable events documented. ?

## 2021-10-01 NOTE — Op Note (Addendum)
[facility] ?Patient Name: Jane [name] ?Procedure Date : 10/01/2021 ?MRN: [id number] ?Attending MD: Mauri Pole , MD ?Date of Birth: 02/07/1946 ?CSN: [id number] ?Age: 76 ?Admit Type: Inpatient ?Procedure:                Colonoscopy ?Indications:              Evaluation of unexplained GI bleeding presenting  ?                          with Hematochezia ?Providers:                Mauri Pole, MD, Jaci Carrel, RN,  ?                          Benetta Spar, Technician ?Referring MD:              ?Medicines:                Monitored Anesthesia Care ?Complications:            No immediate complications. ?Estimated Blood Loss:     Estimated blood loss was minimal. ?Procedure:                Pre-Anesthesia Assessment: ?                          - Prior to the procedure, a History and Physical  ?                          was performed, and patient medications and  ?                          allergies were reviewed. The patient's tolerance of  ?                          previous anesthesia was also reviewed. The risks  ?                          and benefits of the procedure and the sedation  ?                          options and risks were discussed with the patient.  ?                          All questions were answered, and informed consent  ?                          was obtained. Prior Anticoagulants: The patient has  ?                          taken no previous anticoagulant or antiplatelet  ?                          agents. ASA Grade Assessment: III - A patient with  ?  severe systemic disease. After reviewing the risks  ?                          and benefits, the patient was deemed in  ?                          satisfactory condition to undergo the procedure. ?                          After obtaining informed consent, the colonoscope  ?                          was passed under direct vision. Throughout the  ?                          procedure, the  patient's blood pressure, pulse, and  ?                          oxygen saturations were monitored continuously. The  ?                          PCF-HQ190L ([id number]) Olympus colonoscope was  ?                          introduced through the anus and advanced to the the  ?                          terminal ileum, with identification of the  ?                          appendiceal orifice and IC valve. The colonoscopy  ?                          was performed without difficulty. The patient  ?                          tolerated the procedure well. The quality of the  ?                          bowel preparation was adequate. The ileocecal  ?                          valve, appendiceal orifice, and rectum were  ?                          photographed. ?Scope In: 10:05:13 AM ?Scope Out: 10:26:23 AM ?Scope Withdrawal Time: 0 hours 15 minutes 14 seconds  ?Total Procedure Duration: 0 hours 21 minutes 10 seconds  ?Findings: ?     The perianal and digital rectal examinations were normal. ?     Three small patchy angioectasias with typical arborization were found at  ?     the hepatic flexure and in the ascending colon. Coagulation for  ?     hemostasis using argon plasma was successful. ?     A 5 mm polyp was  found in the transverse colon. The polyp was sessile.  ?     The polyp was removed with a cold snare. Resection and retrieval were  ?     complete. ?     There was evidence of a prior end-to-side colo-colonic anastomosis in  ?     the sigmoid colon. This was patent and was characterized by healthy  ?     appearing mucosa. ?     Non-bleeding external and internal hemorrhoids were found during  ?     retroflexion. The hemorrhoids were large. ?     The terminal ileum appeared normal. ?     The exam was otherwise without abnormality. ?Impression:               - Three colonic angioectasias. Treated with argon  ?                          plasma coagulation (APC). ?                          - One 5 mm polyp in the transverse  colon, removed  ?                          with a cold snare. Resected and retrieved. ?                          - Patent end-to-side colo-colonic anastomosis,  ?                          characterized by healthy appearing mucosa. ?                          - Non-bleeding external and internal hemorrhoids. ?                          - The examined portion of the ileum was normal. ?                          - The examination was otherwise normal. ?Recommendation:           - Patient has a contact number available for  ?                          emergencies. The signs and symptoms of potential  ?                          delayed complications were discussed with the  ?                          patient. Return to normal activities tomorrow.  ?                          Written discharge instructions were provided to the  ?                          patient. ?                          -  Resume previous diet. ?                          - Continue present medications. ?                          - Await pathology results. ?                          - No repeat colonoscopy due to age. ?                          - Monitor Hgb daily ?                          - Erythropoietin and Iron as needed per nephrology ?                          - Resume Coumadin (warfarin) at prior dose in 3  ?                          days. Refer to managing physician for further  ?                          adjustment of therapy. ?                          - Ok to DC home tomorrow if no evidence of ongoing  ?                          GI bleed or blood loss ?Procedure Code(s):        --- Professional --- ?                          [id number], 35, Colonoscopy, flexible; with control of  ?                          bleeding, any method ?                          45385, Colonoscopy, flexible; with removal of  ?                          tumor(s), polyp(s), or other lesion(s) by snare  ?                          technique ?Diagnosis Code(s):        ---  Professional --- ?                          K55.20, Angiodysplasia of colon without hemorrhage ?                          K63.5, Polyp of colon ?                          Z98.0, Intestinal  bypass and anastomosis status ?                          K64.8, Other hemorrhoids ?                          K92.1, Melena (includes Hematochezia) ?CPT copyright 2019 [employer] Medical Association. All rights reserved. ?The codes documented in this report are preliminary and upon coder review may  ?be revised to meet current compliance requirements. ?Mauri Pole, MD ?10/01/2021 10:39:37 AM ?This report has been signed electronically. ?Number of Addenda: 0 ?

## 2021-10-01 NOTE — Assessment & Plan Note (Addendum)
5 mm transverse colon polyp s/p resection on 3/14; pathology notable for tubular adenoma without high-grade dysplasia.  Outpatient follow-up with gastroenterology. ?

## 2021-10-01 NOTE — Anesthesia Preprocedure Evaluation (Signed)
Anesthesia Evaluation  ?Patient identified by MRN, date of birth, ID band ?Patient awake ? ? ? ?Reviewed: ?Allergy & Precautions, NPO status , Patient's Chart, lab work & pertinent test results ? ?Airway ?Mallampati: II ? ?TM Distance: >3 FB ? ? ? ? Dental ?  ?Pulmonary ?shortness of breath, asthma , sleep apnea ,  ?  ?breath sounds clear to auscultation ? ? ? ? ? ? Cardiovascular ?hypertension, + dysrhythmias + Valvular Problems/Murmurs  ?Rhythm:Regular Rate:Normal ? ? ?  ?Neuro/Psych ? Headaches,   ? GI/Hepatic ?Neg liver ROS, PUD, GERD  ,  ?Endo/Other  ?negative endocrine ROS ? Renal/GU ?Renal disease  ? ?  ?Musculoskeletal ? ? Abdominal ?(+) + obese,   ?Peds ? Hematology ?  ?Anesthesia Other Findings ? ? Reproductive/Obstetrics ? ?  ? ? ? ? ? ? ? ? ? ? ? ? ? ?  ?  ? ? ? ? ? ? ? ? ?Anesthesia Physical ? ?Anesthesia Plan ? ?ASA: 3 ? ?Anesthesia Plan: MAC  ? ?Post-op Pain Management:   ? ?Induction: Intravenous ? ?PONV Risk Score and Plan: 2 and Treatment may vary due to age or medical condition, Propofol infusion, Midazolam and Ondansetron ? ?Airway Management Planned: Simple Face Mask and Nasal Cannula ? ?Additional Equipment:  ? ?Intra-op Plan:  ? ?Post-operative Plan:  ? ?Informed Consent: I have reviewed the patients History and Physical, chart, labs and discussed the procedure including the risks, benefits and alternatives for the proposed anesthesia with the patient or authorized representative who has indicated his/her understanding and acceptance.  ? ? ? ?Dental advisory given ? ?Plan Discussed with: CRNA and Anesthesiologist ? ?Anesthesia Plan Comments:   ? ? ? ? ? ? ?Anesthesia Quick Evaluation ? ?

## 2021-10-01 NOTE — Assessment & Plan Note (Addendum)
3 colonic angiectasia's identified on colonoscopy 3/14 s/p APC. ? ?

## 2021-10-01 NOTE — Anesthesia Postprocedure Evaluation (Signed)
Anesthesia Post Note ? ?Patient: Jane [name] ? ?Procedure(s) Performed: COLONOSCOPY WITH PROPOFOL ?HOT HEMOSTASIS (ARGON PLASMA COAGULATION/BICAP) ?POLYPECTOMY ? ?  ? ?Patient location during evaluation: PACU ?Anesthesia Type: MAC ?Level of consciousness: awake and alert ?Pain management: pain level controlled ?Vital Signs Assessment: post-procedure vital signs reviewed and stable ?Respiratory status: spontaneous breathing, nonlabored ventilation and respiratory function stable ?Cardiovascular status: blood pressure returned to baseline and stable ?Postop Assessment: no apparent nausea or vomiting ?Anesthetic complications: no ? ? ?No notable events documented. ? ?Last Vitals:  ?Vitals:  ? 10/01/21 1050 10/01/21 1158  ?BP: 109/67 (!) 143/53  ?Pulse: 83 79  ?Resp: 20 17  ?Temp:  36.4 ?C  ?SpO2: 99% 99%  ?  ?Last Pain:  ?Vitals:  ? 10/01/21 1158  ?TempSrc: Oral  ?PainSc:   ? ? ?  ?  ?  ?  ?  ?  ? ?Lynda Rainwater ? ? ? ? ?

## 2021-10-01 NOTE — Anesthesia Procedure Notes (Signed)
Procedure Name: [place] ?Date/Time: 10/01/2021 9:40 AM ?Performed by: Leonor Liv, CRNA ?Pre-anesthesia Checklist: Patient identified, Emergency Drugs available, Suction available and Patient being monitored ?Patient Re-evaluated:Patient Re-evaluated prior to induction ?Oxygen Delivery Method: Simple face mask ?Placement Confirmation: positive ETCO2 ?Dental Injury: Teeth and Oropharynx as per pre-operative assessment  ? ? ? ? ?

## 2021-10-01 NOTE — Progress Notes (Addendum)
?PROGRESS NOTE ? ? ? ?Jane [name]  SVX:[id number] DOB: 21-Jan-1946 DOA: 09/25/2021 ?PCP: [name], [name], MD  ? ? ?Brief Narrative:  ?Jane [name] is a 76 year old female with past medical history significant for paroxysmal atrial fibrillation on Coumadin, ESRD on HD MWF, history of perforated diverticulitis s/p sigmoid colectomy, OSA not on CPAP, history of recurrent GI bleeding and vascular malformations who presented to [facility] ED on 3/8 with reported several weeks of melena and a drop in hemoglobin noted by HD center on routine blood draw.  Patient reports onset of melena x4 weeks, occasional dizziness after dialysis.  She has been holding her Coumadin since 3/3 secondary to high INR.  She was seen by the Coumadin clinic outpatient was advised to discontinue her Coumadin and if she is plan to be referred for [facility] device.  She was planned for colonoscopy outpatient on 3/14, however due to acute drop in hemoglobin she presented to the ED for further evaluation. ? ?In the ED, temperature 98.4 ?F, BP 155/54, HR 95, RR 16, SPO2 100% on room air.  Sodium 138, potassium 3.8, chloride 100, CO2 28, glucose 117, BUN 17, creatinine 5.42.  AST 20, ALT 14, total bilirubin 0.4.  WBC 5.8, hemoglobin 7.6, platelets 204.  MCV 99.2.  COVID-19 PCR negative.  Influenza A/B PCR negative.  Chest x-ray with no active cardiopulmonary disease process.  1 unit PRBC ordered by EDP.  [place] gastro[facility]ology consulted.  [facility] consulted for further evaluation and management of GI bleed and symptomatic anemia.  ? ? ?Assessment & Plan: ?  ?Assessment and Plan: ?* Acute GI bleeding ?Patient presenting to ED with 4-week history of melena and hemoglobin now dropped 2 g to 7.7.  INR 1.2.  History of recurrent GI bleeds in the past complicated by use of Coumadin outpatient.  Coumadin has now been on hold since 3/3. ?--[facility] following, appreciate assistance ?--Hgb 7.7>>9.0>>7.1>>9.5>10.2>9.2>9.1>8.5>7.7 ?--s/p 2u pRBC on 3/8 and 3/11 ?--Colonoscopy  3/9: Blood throughout colon, no source identified, nonbleeding external/internal hemorrhoids, small polyp sigmoid colon ?--Small bowel enteroscopy 3/9: Normal esophagus, multiple gastric polyps with 1 polyp evidence for recent bleeding s/p resection, nodular mucosa to duodenum s/p biopsy, no evidence for recent significant GI bleeding. ?--CTA GI Bleed 3/10: No evidence of active GI bleeding ?--Colonoscopy 3/12: Noted end to side colocolonic anastomosis, 2 small polyps sigmoid colon and ascending colon, fresh blood and clots throughout entire examined colon and distalmost 10 cm terminal ileum. ?--Capsule endoscopy 3/13: Evidence of active oozing right colon or possible terminal ileum ?--Colonoscopy 3/14: 5 mm polyp transverse colon s/p resection, 3 colonic angiectasias s/p APC. ?-- Repeat hemoglobin in a.m., if stable likely discharge home ? ? ?ESRD on hemodialysis ([place]) ?--Nephrology following for continued HD while inpatient ? ? ?Paroxysmal atrial fibrillation (HCC) ?Coumadin has been held since 3/3.  Continue to hold.  INR is subtherapeutic at 1.2.  Patient has followed with cardiology outpatient and anticoagulation has been held indefinitely with referral for potential Watchman device ? ?AVM (arteriovenous malformation) of colon, acquired with hemorrhage ?3 colonic angiectasia's identified on colonoscopy 3/14 s/p APC. ?--Continue to monitor hemoglobin as above ? ?Polyp of transverse colon ?5 mm transverse colon polyp s/p resection on 3/14. ?--Pathology: Pending ? ?Hematochezia ?--Continue monitoring of hemoglobin/hematocrit daily ? ?Gastritis and gastroduodenitis ?--Protonix 40 mg PO daily ? ?Symptomatic anemia ?--s/p 2u pRBC on 3/8 and 3/11 ?--Hemoglobin/hematocrit in the am ?--Transfuse for hemoglobin < 7.0 or active bleeding ? ? ? ?DVT prophylaxis: SCDs Start: 09/26/21 0102 ? ?  Code Status: Full Code ?Family Communication: No family present at bedside this morning ? ?Disposition Plan:  ?Level of care:  Progressive ?Status is: Inpatient ?Remains inpatient appropriate because: Plan repeat hemoglobin in a.m., if stable likely discharge home following dialysis tomorrow ?  ? ?Consultants:  ?[place] gastroenterology ?Nephrology ? ?Procedures:  ?Small bowel enteroscopy 3/9 ?Colonoscopy 3/9 ?Colonoscopy 3/12 ?Colonoscopy 3/14 ? ?Antimicrobials:  ?None ? ? ?Subjective: ?Patient seen examined bedside, resting comfortably.  Just returned from colonoscopy with findings of colonic angiectasias s/p APC and removal of a 5 mm transverse colon polyp.  Eating lunch.  No specific complaints or concerns at this time.  States she feels much better today.  Denies any further bleeding with bowel movements.  Denies headache, no dizziness, no chest pain, palpitations, no shortness of breath, no fever/chills/night sweats, no nausea/vomiting/diarrhea, no weakness, no fatigue, no paresthesias.  No acute events overnight per nursing staff.   ? ?Objective: ?Vitals:  ? 10/01/21 0803 10/01/21 1035 10/01/21 1050 10/01/21 1158  ?BP: (!) 111/39 (!) 117/49 109/67 (!) 143/53  ?Pulse: 78 79 83 79  ?Resp: 14 (!) '22 20 17  '$ ?Temp: 98 ?F (36.7 ?C) 98.2 ?F (36.8 ?C)  97.6 ?F (36.4 ?C)  ?TempSrc: Temporal   Oral  ?SpO2: 100% 100% 99% 99%  ?Weight:      ?Height:      ? ? ?Intake/Output Summary (Last 24 hours) at 10/01/2021 1243 ?Last data filed at 10/01/2021 1020 ?Gross per 24 hour  ?Intake 100 ml  ?Output 2500 ml  ?Net -2400 ml  ? ?Filed Weights  ? 09/27/21 1618 09/30/21 0940 09/30/21 1300  ?Weight: 82 kg 84.4 kg 82 kg  ? ? ?Examination: ? ?Physical Exam: ?GEN: NAD, alert and oriented x 3, wd/wn ?HEENT: NCAT, PERRL, EOMI, sclera clear, MMM ?PULM: CTAB w/o wheezes/crackles, normal respiratory effort, on room air ?CV: RRR w/o M/G/R ?GI: abd soft, NTND, NABS, no R/G/M ?MSK: no peripheral edema, muscle strength globally intact 5/5 bilateral upper/lower extremities ?NEURO: CN II-XII intact, no focal deficits, sensation to light touch intact ?PSYCH: normal  mood/affect ?Integumentary: dry/intact, no rashes or wounds ? ? ? ?Data Reviewed: I have personally reviewed following labs and imaging studies ? ?CBC: ?Recent Labs  ?Lab 09/25/21 ?1151 09/25/21 ?1534 09/25/21 ?2306 09/26/21 ?7371 09/26/21 ?1020 09/29/21 ?0658 09/29/21 ?1811 09/30/21 ?0456 09/30/21 ?1811 10/01/21 ?0626  ?WBC 6.1 5.8  --  6.3  --   --   --   --   --   --   ?HGB 7.7 Repeated and verified X2.* 7.6*   < > 7.9*   < > 10.2* 9.2* 9.1* 8.5* 7.7*  ?HCT 23.6 Repeated and verified X2.* 24.5*   < > 25.0*   < > 30.0* 29.3* 29.2* 25.4* 23.8*  ?MCV 92.6 99.2  --  95.1  --   --   --   --   --   --   ?PLT 201.0 204  --  179  --   --   --   --   --   --   ? < > = values in this interval not displayed.  ? ?Basic Metabolic Panel: ?Recent Labs  ?Lab 09/25/21 ?1534 09/26/21 ?0208 09/27/21 ?1211 09/29/21 ?9485  ?NA 138 141 137 141  ?K 3.8 4.2 4.5 4.0  ?CL 100 105 98 105  ?CO2 28 23 21*  --   ?GLUCOSE 117* 89 74 94  ?BUN 17 25* 44* 38*  ?CREATININE 5.42* 7.03* 10.35* 10.00*  ?CALCIUM 9.9 9.5  9.6  --   ?PHOS  --   --  5.9*  --   ? ?GFR: ?Estimated Creatinine Clearance: 5 mL/min (A) (by C-G formula based on SCr of 10 mg/dL (H)). ?Liver Function Tests: ?Recent Labs  ?Lab 09/25/21 ?1534 09/27/21 ?1211  ?AST 20  --   ?ALT 14  --   ?ALKPHOS 108  --   ?BILITOT 0.4  --   ?PROT 7.5  --   ?ALBUMIN 3.6 3.3*  ? ?No results for input(s): LIPASE, AMYLASE in the last 168 hours. ?No results for input(s): AMMONIA in the last 168 hours. ?Coagulation Profile: ?Recent Labs  ?Lab 09/25/21 ?1654  ?INR 1.2  ? ?Cardiac Enzymes: ?No results for input(s): CKTOTAL, CKMB, CKMBINDEX, TROPONINI in the last 168 hours. ?BNP (last 3 results) ?No results for input(s): PROBNP in the last 8760 hours. ?HbA1C: ?No results for input(s): HGBA1C in the last 72 hours. ?CBG: ?No results for input(s): GLUCAP in the last 168 hours. ?Lipid Profile: ?No results for input(s): CHOL, HDL, LDLCALC, TRIG, CHOLHDL, LDLDIRECT in the last 72 hours. ?Thyroid Function Tests: ?No  results for input(s): TSH, T4TOTAL, FREET4, T3FREE, THYROIDAB in the last 72 hours. ?Anemia Panel: ?No results for input(s): VITAMINB12, FOLATE, FERRITIN, TIBC, IRON, RETICCTPCT in the last 72 hours. ?Sepsis Labs:

## 2021-10-01 NOTE — Progress Notes (Addendum)
Dialysis Orders: Center: [facility] MWF ?3.5 hrs 180NRe 400/500 2.0K/3.0 Ca AVF ?-Heparin 2000 units IV TIW (Have already dc'd this order!) ?-Mircera 75 mcg IV q 2 weeks (last dose 50 mcg IV 09/20/2021) ?-Calcitriol 1.0 mcg PO TIW ?  ?Problem/Plan: ? GIB-W/U per GI, no obvious source of bleeding found so far. Repeat colonoscopy today. Coumadin on indefinite hold.  Per primary/GI. ? ESRD -MWF HD , HD today on schedule. Heparin has been Dc'd here and at OP clinic. Next HD tomorrow. ? Hypertension/volume  - No evidence of overt volume excess  EDW recently raised D/T hypotension/dizziness. May need to increase EDW more. So far has tolerated HD without hypotensive episode.Net UF 1787 ml Post wt 82 kg. Having issues at end of tx with hypotension. Add UFP 2. UF as tolerated. On midodrine on HD days but slightly hypertensive on HD. Holding for now.  ? Anemia  - 2 units S/P units of PRBCs Recent ESA at OP center. Follow HGB.  ? Metabolic bone disease -  Continue binders, VDRA. Corrected Ca+ 10.6. Change to 2.0 Ca bath.  Restart Renvela 800 TID w/ meals; currently NPO for colonoscopy ? Nutrition - Renal diet with fld restrictions. Albumin 3.3. Add protein supps.  ?H/O PAF-Coumadin on hold. Being evaluated for Watchman. Upcoming appt with Dr. Burt Knack. SR on monitor. ? ? ?Subjective: No further dark black tarry stool, now light pinkish. Denies SOB except at night. O2 helps. ? ?Objective ?Vital signs in last 24 hours: ?Vitals:  ? 09/30/21 1300 09/30/21 2022 09/30/21 2300 10/01/21 0333  ?BP: 126/69 (!) 144/55 (!) 142/55 (!) 116/44  ?Pulse: 95 78 80 80  ?Resp: '20 15 18 20  '$ ?Temp: 97.6 ?F (36.4 ?C) 98.3 ?F (36.8 ?C)  97.6 ?F (36.4 ?C)  ?TempSrc: Oral Oral  Oral  ?SpO2: 100% 96% 92% 91%  ?Weight: 82 kg     ?Height:      ? ?Weight change:  ? ?Physical Exam: ?General: Alert pleasant elderly female NAD ?Heart: RRR no MRG ?Lungs: CTA nonlabored breathing ?Abdomen: NABS obese NTND ?Extremities: Trace bipedal edema ?Dialysis Access: Right  Cimino AV fistula positive thrill ? ? ? ?Labs: ?Basic Metabolic Panel: ?Recent Labs  ?Lab 09/25/21 ?1534 09/26/21 ?0208 09/27/21 ?1211 09/29/21 ?3818  ?NA 138 141 137 141  ?K 3.8 4.2 4.5 4.0  ?CL 100 105 98 105  ?CO2 28 23 21*  --   ?GLUCOSE 117* 89 74 94  ?BUN 17 25* 44* 38*  ?CREATININE 5.42* 7.03* 10.35* 10.00*  ?CALCIUM 9.9 9.5 9.6  --   ?PHOS  --   --  5.9*  --   ? ?Liver Function Tests: ?Recent Labs  ?Lab 09/25/21 ?1534 09/27/21 ?1211  ?AST 20  --   ?ALT 14  --   ?ALKPHOS 108  --   ?BILITOT 0.4  --   ?PROT 7.5  --   ?ALBUMIN 3.6 3.3*  ? ?No results for input(s): LIPASE, AMYLASE in the last 168 hours. ?No results for input(s): AMMONIA in the last 168 hours. ?CBC: ?Recent Labs  ?Lab 09/25/21 ?1151 09/25/21 ?1534 09/25/21 ?2306 09/26/21 ?2993 09/26/21 ?1020 09/30/21 ?0456 09/30/21 ?1811 10/01/21 ?7169  ?WBC 6.1 5.8  --  6.3  --   --   --   --   ?HGB 7.7 Repeated and verified X2.* 7.6*   < > 7.9*   < > 9.1* 8.5* 7.7*  ?HCT 23.6 Repeated and verified X2.* 24.5*   < > 25.0*   < > 29.2* 25.4* 23.8*  ?  MCV 92.6 99.2  --  95.1  --   --   --   --   ?PLT 201.0 204  --  179  --   --   --   --   ? < > = values in this interval not displayed.  ? ?Cardiac Enzymes: ?No results for input(s): CKTOTAL, CKMB, CKMBINDEX, TROPONINI in the last 168 hours. ?CBG: ?No results for input(s): GLUCAP in the last 168 hours. ? ?Studies/Results: ?No results found. ?Medications: ? ? calcitRIOL  0.75 mcg Oral Q M,W,F-HD  ? Chlorhexidine Gluconate Cloth  6 each Topical Q0600  ? famotidine  20 mg Oral QHS  ? linaclotide  145 mcg Oral QAC breakfast  ? pantoprazole  40 mg Oral Daily  ? ? ? ? ?

## 2021-10-01 NOTE — Interval H&P Note (Signed)
History and Physical Interval Note: ? ?10/01/2021 ?8:24 AM ? ?[name]  has presented today for surgery, with the diagnosis of gi bleeding.   blood loss anemia.  The various methods of treatment have been discussed with the patient and family. After consideration of risks, benefits and other options for treatment, the patient has consented to  Procedure(s): ?COLONOSCOPY WITH PROPOFOL (N/A) as a surgical intervention.  The patient's history has been reviewed, patient examined, no change in status, stable for surgery.  I have reviewed the patient's chart and labs.  Questions were answered to the patient's satisfaction.   ? ? ?Jane [name] ? ? ?

## 2021-10-02 ENCOUNTER — Encounter (HOSPITAL_COMMUNITY): Payer: Self-pay | Admitting: Gastroenterology

## 2021-10-02 ENCOUNTER — Telehealth: Payer: Self-pay | Admitting: Pharmacist

## 2021-10-02 LAB — CBC
HCT: 23.4 % — ABNORMAL LOW (ref 36.0–46.0)
Hemoglobin: 7.5 g/dL — ABNORMAL LOW (ref 12.0–15.0)
MCH: 30.2 pg (ref 26.0–34.0)
MCHC: 32.1 g/dL (ref 30.0–36.0)
MCV: 94.4 fL (ref 80.0–100.0)
Platelets: 137 10*3/uL — ABNORMAL LOW (ref 150–400)
RBC: 2.48 MIL/uL — ABNORMAL LOW (ref 3.87–5.11)
RDW: 15.9 % — ABNORMAL HIGH (ref 11.5–15.5)
WBC: 6.2 10*3/uL (ref 4.0–10.5)
nRBC: 0 % (ref 0.0–0.2)

## 2021-10-02 LAB — RENAL FUNCTION PANEL
Albumin: 3 g/dL — ABNORMAL LOW (ref 3.5–5.0)
Anion gap: 12 (ref 5–15)
BUN: 31 mg/dL — ABNORMAL HIGH (ref 8–23)
CO2: 25 mmol/L (ref 22–32)
Calcium: 9 mg/dL (ref 8.9–10.3)
Chloride: 97 mmol/L — ABNORMAL LOW (ref 98–111)
Creatinine, Ser: 9.64 mg/dL — ABNORMAL HIGH (ref 0.44–1.00)
GFR, Estimated: 4 mL/min — ABNORMAL LOW (ref >60)
Glucose, Bld: 107 mg/dL — ABNORMAL HIGH (ref 70–99)
Phosphorus: 6.2 mg/dL — ABNORMAL HIGH (ref 2.5–4.6)
Potassium: 3.6 mmol/L (ref 3.5–5.1)
Sodium: 134 mmol/L — ABNORMAL LOW (ref 135–145)

## 2021-10-02 LAB — SURGICAL PATHOLOGY

## 2021-10-02 LAB — GLUCOSE, CAPILLARY: Glucose-Capillary: 118 mg/dL — ABNORMAL HIGH (ref 70–99)

## 2021-10-02 MED ORDER — FAMOTIDINE 20 MG PO TABS: 20.0000 mg | ORAL_TABLET | Freq: Every day | ORAL | 2 refills | Status: DC

## 2021-10-02 NOTE — Progress Notes (Signed)
Patient refused lab work this morning due to her hand hurting. Educated patient on importance of knowing hemoglobin. Patient requested lab work be drawn during dialysis.  ?Educated patient again on importance of knowing lab work this morning. Continues to request later lab work drawn.  ? ?

## 2021-10-02 NOTE — Discharge Instructions (Signed)
Continue to hold Coumadin until follows up with cardiology ?

## 2021-10-02 NOTE — Progress Notes (Signed)
Dialysis Orders: Center: [facility] MWF ?3.5 hrs 180NRe 400/500 2.0K/3.0 Ca AVF ?-Heparin 2000 units IV TIW (Have already dc'd this order!) ?-Mircera 75 mcg IV q 2 weeks (last dose 50 mcg IV 09/20/2021) ?-Calcitriol 1.0 mcg PO TIW ?  ?Problem/Plan: ? GIB-W/U per GI, no obvious source of bleeding found so far. Repeat colonoscopy 3/14 -> AVM s/p APC + polyp resection;. Coumadin on indefinite hold.  Per primary/GI. ? ESRD -MWF HD , HD today on schedule. Heparin has been Dc'd here and at OP clinic. Next HD today; trying to get her on 1st shift in case she's d/c'd today. 2nd shift at worst. Will also have dialysis draw her CBC as she refused to be stuck in the contralateral arm this am. ? Hypertension/volume  - No evidence of overt volume excess  EDW recently raised D/T hypotension/dizziness. May need to increase EDW more. So far has tolerated HD without hypotensive episode.Net UF 1787 ml Post wt 82 kg. Having issues at end of tx with hypotension. Added UFP 2. UF as tolerated. On midodrine on HD days but slightly hypertensive on HD. Holding for now.  ? Anemia  - secondary to AVM s/p APC + polyp resection; 2 units S/P units of PRBCs Recent ESA at OP center. Follow HGB.  ? Metabolic bone disease -  Continue binders, VDRA. Corrected Ca+ 10.6. Changed to 2.0 Ca bath.  Restart Renvela 800 TID w/ meals. ? Nutrition - Renal diet with fld restrictions. Albumin 3.3. Add protein supps.  ?H/O PAF-Coumadin on hold. Being evaluated for Watchman. Upcoming appt with Dr. Burt Knack. SR on monitor. ? ? ?Subjective: No further dark black tarry stool, now light pinkish. Denies SOB except at night. O2 helps. Refused venopuncture this am.  ? ?Objective ?Vital signs in last 24 hours: ?Vitals:  ? 10/01/21 1616 10/01/21 1952 10/01/21 2345 10/02/21 0351  ?BP: (!) 113/45 (!) 119/50 (!) 101/47 (!) 110/45  ?Pulse: 66 74 69 68  ?Resp: '19 15 15 '$ (!) 21  ?Temp: 97.8 ?F (36.6 ?C) (!) 97.5 ?F (36.4 ?C) 98.1 ?F (36.7 ?C) 98.6 ?F (37 ?C)  ?TempSrc: Oral Oral Oral  Oral  ?SpO2: 98% 98% 98% 100%  ?Weight:      ?Height:      ? ?Weight change:  ? ?Physical Exam: ?General: Alert pleasant elderly female NAD ?Heart: RRR no MRG ?Lungs: CTA nonlabored breathing ?Abdomen: NABS obese NTND ?Extremities: Trace bipedal edema ?Dialysis Access: Right Cimino AV fistula positive thrill ? ? ? ?Labs: ?Basic Metabolic Panel: ?Recent Labs  ?Lab 09/25/21 ?1534 09/26/21 ?0208 09/27/21 ?1211 09/29/21 ?1062  ?NA 138 141 137 141  ?K 3.8 4.2 4.5 4.0  ?CL 100 105 98 105  ?CO2 28 23 21*  --   ?GLUCOSE 117* 89 74 94  ?BUN 17 25* 44* 38*  ?CREATININE 5.42* 7.03* 10.35* 10.00*  ?CALCIUM 9.9 9.5 9.6  --   ?PHOS  --   --  5.9*  --   ? ?Liver Function Tests: ?Recent Labs  ?Lab 09/25/21 ?1534 09/27/21 ?1211  ?AST 20  --   ?ALT 14  --   ?ALKPHOS 108  --   ?BILITOT 0.4  --   ?PROT 7.5  --   ?ALBUMIN 3.6 3.3*  ? ?No results for input(s): LIPASE, AMYLASE in the last 168 hours. ?No results for input(s): AMMONIA in the last 168 hours. ?CBC: ?Recent Labs  ?Lab 09/25/21 ?1151 09/25/21 ?1534 09/25/21 ?2306 09/26/21 ?6948 09/26/21 ?1020 09/30/21 ?0456 09/30/21 ?1811 10/01/21 ?5462  ?WBC 6.1 5.8  --  6.3  --   --   --   --   ?HGB 7.7 Repeated and verified X2.* 7.6*   < > 7.9*   < > 9.1* 8.5* 7.7*  ?HCT 23.6 Repeated and verified X2.* 24.5*   < > 25.0*   < > 29.2* 25.4* 23.8*  ?MCV 92.6 99.2  --  95.1  --   --   --   --   ?PLT 201.0 204  --  179  --   --   --   --   ? < > = values in this interval not displayed.  ? ?Cardiac Enzymes: ?No results for input(s): CKTOTAL, CKMB, CKMBINDEX, TROPONINI in the last 168 hours. ?CBG: ?No results for input(s): GLUCAP in the last 168 hours. ? ?Studies/Results: ?No results found. ?Medications: ? sodium chloride    ? ? calcitRIOL  0.75 mcg Oral Q M,W,F-HD  ? Chlorhexidine Gluconate Cloth  6 each Topical Q0600  ? famotidine  20 mg Oral QHS  ? linaclotide  145 mcg Oral QAC breakfast  ? pantoprazole  40 mg Oral Daily  ? sevelamer carbonate  800 mg Oral TID WC  ? ? ? ? ?

## 2021-10-02 NOTE — Discharge Summary (Signed)
?Physician Discharge Summary  ?Jane [name] FMB:[id number] DOB: 1945/07/28 DOA: 09/25/2021 ? ?PCP: [name], [name], MD ? ?Admit date: 09/25/2021 ?Discharge date: 10/02/2021 ? ?Admitted From: Home ?Disposition: Home ? ?Recommendations for Outpatient Follow-up:  ?Follow up with PCP in 1-2 weeks ?Follow-up with [place] [facility], Dr. Hilarie Fredrickson as scheduled ?Follow-up with cardiology, Dr. Einar Gip for determination of restart of Coumadin if deemed appropriate as patient has upcoming appointment for consideration of Watchman device. ?Please obtain hemoglobin level at next HD session. ? ?Home Health: No ?Equipment/Devices: None ? ?Discharge Condition: Stable ?CODE STATUS: Full code ?Diet recommendation: Renal diet ? ?History of present illness: ? ?Jane [name] is a 76 year old female with past medical history significant for paroxysmal atrial fibrillation on Coumadin, ESRD on HD MWF, history of perforated diverticulitis s/p sigmoid colectomy, OSA not on CPAP, history of recurrent GI bleeding and vascular malformations who presented to [facility] ED on 3/8 with reported several weeks of melena and a drop in hemoglobin noted by HD center on routine blood draw.  Patient reports onset of melena x4 weeks, occasional dizziness after dialysis.  She has been holding her Coumadin since 3/3 secondary to high INR.  She was seen by the Coumadin clinic outpatient was advised to discontinue her Coumadin and if she is plan to be referred for [facility] device.  She was planned for colonoscopy outpatient on 3/14, however due to acute drop in hemoglobin she presented to the ED for further evaluation. ? ?In the ED, temperature 98.4 ?F, BP 155/54, HR 95, RR 16, SPO2 100% on room air.  Sodium 138, potassium 3.8, chloride 100, CO2 28, glucose 117, BUN 17, creatinine 5.42.  AST 20, ALT 14, total bilirubin 0.4.  WBC 5.8, hemoglobin 7.6, platelets 204.  MCV 99.2.  COVID-19 PCR negative.  Influenza A/B PCR negative.  Chest x-ray with no active cardiopulmonary  disease process.  1 unit PRBC ordered by EDP.  [place] gastroenterology consulted.  [facility] consulted for further evaluation and management of GI bleed and symptomatic anemia. ? ?Hospital course: ? ?Assessment and Plan: ?* Acute GI bleeding ?Patient presenting to ED with 4-week history of melena and hemoglobin now dropped 2 g to 7.7.  INR 1.2.  History of recurrent GI bleeds in the past complicated by use of Coumadin outpatient.  Coumadin has now been on hold since 3/3. [place] [facility] was consulted and followed during hospital course. Colonoscopy 3/9: Blood throughout colon, no source identified, nonbleeding external/internal hemorrhoids, small polyp sigmoid colon. Small bowel enteroscopy 3/9: Normal esophagus, multiple gastric polyps with 1 polyp evidence for recent bleeding s/p resection, nodular mucosa to duodenum s/p biopsy (pathology with focal erosion and gastric polyp with focal inflammation), no evidence for recent significant GI bleeding. CTA GI Bleed study 3/10: No evidence of active GI bleeding. Capsule endoscopy 3/13: Evidence of active oozing right colon or possible terminal ileum. Colonoscopy 3/14: 5 mm polyp transverse colon s/p resection (pathology tubular adenoma without high-grade dysplasia), 3 colonic angiectasias s/p APC.  Patient was transfused 2 units PRBCs during hospitalization.  Hemoglobin 7.5 at time of discharge.  Denies any further blood in stool.  GI signed off.  Outpatient follow-up with gastroenterology.  Recommend repeat hemoglobin at next HD session. ? ? ?ESRD on hemodialysis ([place]) ?Nephrology was consulted for continue HD while inpatient.  Next HD planned for Friday 10/04/2021.  Recommend hemoglobin hematocrit at next visit. ? ? ?Paroxysmal atrial fibrillation (HCC) ?Coumadin has been held since 3/3.  Continue to hold.  INR is subtherapeutic at 1.2.  Patient has followed with cardiology outpatient and anticoagulation has been held indefinitely with referral for potential Watchman device.   Outpatient follow-up with cardiology, Dr. Einar Gip for determination of restarting Coumadin. ? ?AVM (arteriovenous malformation) of colon, acquired with hemorrhage ?3 colonic angiectasia's identified on colonoscopy 3/14 s/p APC. ? ? ?Polyp of transverse colon ?5 mm transverse colon polyp s/p resection on 3/14; pathology notable for tubular adenoma without high-grade dysplasia.  Outpatient follow-up with gastroenterology. ? ?Gastritis and gastroduodenitis ?Protonix 40 mg PO daily, Pepcid 20 mg p.o. daily ? ?Symptomatic anemia ?Transfuse 1 unit PRBC on 3/8 followed by 3/11.  Hemoglobin 7.5 on discharge; and patient asymptomatic without dizziness or feeling of fatigue.  Recommend repeat hemoglobin at next HD session. ? ? ? ? ? ? ?Discharge Diagnoses:  ?Principal Problem: ?  Acute GI bleeding ?Active Problems: ?  ESRD on hemodialysis ([place]) ?  Paroxysmal atrial fibrillation (HCC) ?  Symptomatic anemia ?  Gastritis and gastroduodenitis ?  Hematochezia ?  Polyp of transverse colon ?  AVM (arteriovenous malformation) of colon, acquired with hemorrhage ? ? ? ?Discharge Instructions ? ?Discharge Instructions   ? ? Call MD for:  difficulty breathing, headache or visual disturbances   Complete by: As directed ?  ? Call MD for:  extreme fatigue   Complete by: As directed ?  ? Call MD for:  persistant dizziness or light-headedness   Complete by: As directed ?  ? Call MD for:  persistant nausea and vomiting   Complete by: As directed ?  ? Call MD for:  severe uncontrolled pain   Complete by: As directed ?  ? Call MD for:  temperature >100.4   Complete by: As directed ?  ? Diet - low sodium heart healthy   Complete by: As directed ?  ? Increase activity slowly   Complete by: As directed ?  ? ?  ? ?Allergies as of 10/02/2021   ? ?   Reactions  ? Latex Rash  ? Penicillins Other (See Comments)  ? Yeast infection / Childhood  ? Sulfa Antibiotics Rash  ? Tape Other (See Comments)  ? Plastic, silicone, and paper tape causes bruising and  pulls off skin. Cloth tape works fine  ? ?  ? ?  ?Medication List  ?  ? ?STOP taking these medications   ? ?warfarin 5 MG tablet ?Commonly known as: COUMADIN ?  ? ?  ? ?TAKE these medications   ? ?atorvastatin 10 MG tablet ?Commonly known as: LIPITOR ?Take 1 tablet (10 mg total) by mouth daily. ?  ?CALCITRIOL PO ?Take 2 tablets by mouth every Monday, Wednesday, and Friday with hemodialysis. ?  ?DIALYVITE 800 WITH ZINC 0.8 MG Tabs ?Take 1 tablet by mouth in the morning. ?  ?ethyl chloride spray ?Apply 1 application topically every Monday, Wednesday, and Friday with hemodialysis. ?  ?famotidine 20 MG tablet ?Commonly known as: PEPCID ?Take 1 tablet (20 mg total) by mouth at bedtime. ?  ?linaclotide 145 MCG Caps capsule ?Commonly known as: Linzess ?TAKE 1 CAPSULE BY MOUTH ONCE DAILY BEFORE BREAKFAST ?What changed:  ?how much to take ?how to take this ?when to take this ?additional instructions ?  ?midodrine 10 MG tablet ?Commonly known as: PROAMATINE ?Take 1 tablet (10 mg total) by mouth 3 (three) times daily as needed (low BP and dizziness and lightheadedness symptoms.). ?What changed: reasons to take this ?  ?MIRCERA IJ ?Inject 1 Dose into the vein every Monday, Wednesday, and Friday. ?  ?pantoprazole 40 MG  tablet ?Commonly known as: PROTONIX ?Take 1 tablet (40 mg total) by mouth 2 (two) times daily. ?  ?sevelamer carbonate 800 MG tablet ?Commonly known as: RENVELA ?Take 800 mg by mouth in the morning and at bedtime. With meals ?  ? ?  ? ? Follow-up Information   ? ? [name], [name], MD. Schedule an appointment as soon as possible for a visit in 1 week(s).   ?Specialty: Family Medicine ?Contact information: ?[address] ?[place] [address] ?[phone] ? ? ?  ?  ? ? Pyrtle, Lajuan Lines, MD. Schedule an appointment as soon as possible for a visit.   ?Specialty: Gastroenterology ?Contact information: ?[address] ?[place] [address] ?[phone] ? ? ?  ?  ? ? Adrian Prows, MD. Schedule an appointment as  soon as possible for a visit.   ?Specialty: Cardiology ?Contact information: ?[address] ?[address] ?[place] [address] ?[phone] ? ? ?  ?  ? ?  ?  ? ?  ? ?Allergies  ?Allergen Reactions  ? Latex Ra

## 2021-10-02 NOTE — Telephone Encounter (Signed)
She should restart and wait for LA appendage occluder placement

## 2021-10-02 NOTE — Progress Notes (Signed)
Patient lost IV access. Patient refused new IV is to be discharged later today. Education on importance of IV.  ? ?

## 2021-10-03 ENCOUNTER — Encounter: Payer: Self-pay | Admitting: Gastroenterology

## 2021-10-03 ENCOUNTER — Telehealth: Payer: Medicare Other

## 2021-10-04 DIAGNOSIS — K922 Gastrointestinal hemorrhage, unspecified: Secondary | ICD-10-CM | POA: Diagnosis not present

## 2021-10-04 DIAGNOSIS — D631 Anemia in chronic kidney disease: Secondary | ICD-10-CM | POA: Diagnosis not present

## 2021-10-04 DIAGNOSIS — N186 End stage renal disease: Secondary | ICD-10-CM | POA: Diagnosis not present

## 2021-10-04 DIAGNOSIS — N2581 Secondary hyperparathyroidism of renal origin: Secondary | ICD-10-CM | POA: Diagnosis not present

## 2021-10-04 DIAGNOSIS — Z992 Dependence on renal dialysis: Secondary | ICD-10-CM | POA: Diagnosis not present

## 2021-10-07 DIAGNOSIS — D631 Anemia in chronic kidney disease: Secondary | ICD-10-CM | POA: Diagnosis not present

## 2021-10-07 DIAGNOSIS — Z992 Dependence on renal dialysis: Secondary | ICD-10-CM | POA: Diagnosis not present

## 2021-10-07 DIAGNOSIS — N2581 Secondary hyperparathyroidism of renal origin: Secondary | ICD-10-CM | POA: Diagnosis not present

## 2021-10-07 DIAGNOSIS — N186 End stage renal disease: Secondary | ICD-10-CM | POA: Diagnosis not present

## 2021-10-09 DIAGNOSIS — N186 End stage renal disease: Secondary | ICD-10-CM | POA: Diagnosis not present

## 2021-10-09 DIAGNOSIS — N2581 Secondary hyperparathyroidism of renal origin: Secondary | ICD-10-CM | POA: Diagnosis not present

## 2021-10-09 DIAGNOSIS — Z992 Dependence on renal dialysis: Secondary | ICD-10-CM | POA: Diagnosis not present

## 2021-10-09 DIAGNOSIS — D631 Anemia in chronic kidney disease: Secondary | ICD-10-CM | POA: Diagnosis not present

## 2021-10-10 ENCOUNTER — Ambulatory Visit: Payer: Medicare Other | Admitting: Pharmacist

## 2021-10-10 ENCOUNTER — Other Ambulatory Visit: Payer: Self-pay

## 2021-10-10 DIAGNOSIS — Z5181 Encounter for therapeutic drug level monitoring: Secondary | ICD-10-CM

## 2021-10-10 DIAGNOSIS — Z7901 Long term (current) use of anticoagulants: Secondary | ICD-10-CM | POA: Diagnosis not present

## 2021-10-10 DIAGNOSIS — I480 Paroxysmal atrial fibrillation: Secondary | ICD-10-CM

## 2021-10-10 LAB — POCT INR: INR: 1.3 — AB (ref 2.0–3.0)

## 2021-10-10 NOTE — Patient Instructions (Signed)
INR below goal. Continue taking 5 mg daily. Recheck INR in 1 week.  ?

## 2021-10-10 NOTE — Progress Notes (Signed)
Anticoagulation Management ?Jane [name] is a 76 y.o. female who reports to the clinic for monitoring of warfarin treatment.   ? ?Indication: atrial fibrillation CHA2DS2 Vasc Score 4 (Age >53, female, HTN hx), HAS-BLED 2 (Age>65, renal disease)  ?Duration: indefinite ?Supervising physician: Adrian Prows ? ?Anticoagulation Clinic Visit History: ? ?Patient does not report signs/symptoms of bleeding or thromboembolism. ? ?Reports no changes in diet, medications, or lifestyle.  ? ?Pt with recent hospital admission on 09/25/21 - 10/02/21 for acute GI bleed and low Hgb. Pt has held her home warfarin since 09/19/26. Hbg on admission of 7.6. Colonoscopy on 3/9 showed blood throughout her colon without any identifiable source. Endoscopy from 3/7 did show concerns of multiple gastric polyps, with 1 polyp noted to have concerns of recent bleeding. CTA GI bleed study 3/10 negative for active GI bleed. Capsule endoscopy 3/13 showed active oozing right colon or possible terminal ileum. Colonoscopy 3/14: 5 mm polyp transverse colon s/p resection (pathology tubular adenoma without high-grade dysplasia), 3 colonic angiectasias s/p APC.  Patient was transfused 2 units PRBCs during hospitalization. Hemoglobin 7.5 at time of discharge. Pt recommended outpatient GI f/u. Pt gpt repeat Hbg completed through her dialysis caregiver on 10/04/21 showed slight improvement in her Hbg to 8.5. Pt reports that she received a dose of IV iron last Friday.  ? ?Denies any recurrence of GI bleeding concerns since. Pt restarted home warfarin 5 mg daily on 10/08/21. Pt is being considered for possible Watchman procedure in the future with Dr. Burt Knack. New consult appointment scheduled for 10/17/21. Pt with significant past GI bleeding history requiring hospital admission. Had 3 hospital admission for GI bleed management over the past 12 months. ? ?Pt continues to have soft BP and hypotensive symptoms. Pt started to use scheduled midodrine 10 mg BID -TID. Continues to  hold her hydralazine without significant improvement in her symptoms. Pt notes that she feels fatigued, lightheaded, dizzy, nauseous whenever her SBP <120s. Noticed improvement in her nausea, lightheaded and dizziness symptoms since taking scheduling her midodrine. Pt recently had her dry weight adjusted through her dialysis center to manage her hypotensive episodes. Will continue remote BP monitoring.  ? ?Anticoagulation Episode Summary   ? ? Current INR goal:  2.0-3.0  ?TTR:  59.4 % (3 y)  ?Next INR check:  10/17/2021  ?INR from last check:  1.3 (10/10/2021)  ?Weekly max warfarin dose:    ?Target end date:  Indefinite  ?INR check location:    ?Preferred lab:    ?Send INR reminders to:    ? Indications   ?Paroxysmal atrial fibrillation (HCC) [I48.0] ?Monitoring for long-term anticoagulant use [Z51.81 ?Z79.01] ? ?  ?  ? ? Comments:    ?  ? ?  ? ? ?Allergies  ?Allergen Reactions  ? Latex Rash  ? Penicillins Other (See Comments)  ?  Yeast infection / Childhood  ? Sulfa Antibiotics Rash  ? Tape Other (See Comments)  ?  Plastic, silicone, and paper tape causes bruising and pulls off skin. Cloth tape works fine  ? ? ?Current Outpatient Medications:  ?  atorvastatin (LIPITOR) 10 MG tablet, Take 1 tablet (10 mg total) by mouth daily., Disp: 30 tablet, Rfl: 3 ?  B Complex-C-Zn-Folic Acid (DIALYVITE 956 WITH ZINC) 0.8 MG TABS, Take 1 tablet by mouth in the morning., Disp: , Rfl:  ?  CALCITRIOL PO, Take 2 tablets by mouth every Monday, Wednesday, and Friday with hemodialysis., Disp: , Rfl:  ?  ethyl chloride spray, Apply 1 application  topically every Monday, Wednesday, and Friday with hemodialysis., Disp: , Rfl: 12 ?  famotidine (PEPCID) 20 MG tablet, Take 1 tablet (20 mg total) by mouth at bedtime., Disp: 30 tablet, Rfl: 2 ?  linaclotide (LINZESS) 145 MCG CAPS capsule, TAKE 1 CAPSULE BY MOUTH ONCE DAILY BEFORE BREAKFAST (Patient taking differently: Take 145 mcg by mouth daily before breakfast.), Disp: 90 capsule, Rfl: 2 ?   Methoxy PEG-Epoetin Beta (MIRCERA IJ), Inject 1 Dose into the vein every Monday, Wednesday, and Friday., Disp: , Rfl:  ?  midodrine (PROAMATINE) 10 MG tablet, Take 1 tablet (10 mg total) by mouth 3 (three) times daily as needed (low BP and dizziness and lightheadedness symptoms.). (Patient taking differently: Take 10 mg by mouth 3 (three) times daily as needed (low BP and dizziness and lightheadedness symptoms. SBP <130 mmHg).), Disp: 90 tablet, Rfl: 3 ?  pantoprazole (PROTONIX) 40 MG tablet, Take 1 tablet (40 mg total) by mouth 2 (two) times daily., Disp: 180 tablet, Rfl: 2 ?  sevelamer carbonate (RENVELA) 800 MG tablet, Take 800 mg by mouth in the morning and at bedtime. With meals, Disp: , Rfl:  ?Past Medical History:  ?Diagnosis Date  ? Acid reflux   ? Anemia of chronic disease   ? Arthritis   ? Asthma   ? Atrial fibrillation ([place])   ? Bilateral carotid bruits   ? Complication of anesthesia   ? "hard to wake up, I have sleep apnea" no CPAP  ? Diverticulitis   ? Duodenal ulcer   ? Dysrhythmia   ? Afib  ? ESRD (end stage renal disease) ([place])   ? MWF [place]  ? Headache   ? Heart murmur   ? History of blood transfusion   ? Hypertension   ? Malaise and fatigue   ? Orthostatic hypotension   ? Shortness of breath   ? " when I walk to fast"  ? Sleep apnea   ? Syncope   ? Tubulovillous adenoma of colon   ? ?ASSESSMENT ? ?Recent Results: ?The most recent result is correlated with 42.5 mg per week: ? ?Lab Results  ?Component Value Date  ? INR 1.3 (A) 10/10/2021  ? INR 1.2 09/25/2021  ? INR 1.9 (A) 09/24/2021  ? ? ?Anticoagulation Dosing: ?Description   ?INR below goal. Continue taking 5 mg daily. Recheck INR in 1 week.  ?  ?  ? ?INR today: Subtherapeutic. 2-days post starting back on previous home warfarin use. Denies any further complains of hematochezia, melena, hematemesis. Slight improvement in her Hbg since being discharged. Pt with high bleeding risk in setting of recurrent significant GI bleeding concerns.   ? ?Denies any other relevant changes in diet, medications, or lifestyle. Denies any other relevant complains of bleeding or bruising symptoms. Denies any TIA/stroke like symptoms. Will restart on slightly lower weekly warfarin dose of 35 mg/week and will monitor and follow up closely.   ? ?PLAN ?Weekly dose was unchanged by 0% to 35 mg/week. Continue taking 5 mg daily. Recheck INR in 1 week.  ? ?Patient Instructions  ?INR below goal. Continue taking 5 mg daily. Recheck INR in 1 week.  ?Patient advised to contact clinic or seek medical attention if signs/symptoms of bleeding or thromboembolism occur. ? ?Patient verbalized understanding by repeating back information and was advised to contact me if further medication-related questions arise.  ? ?Follow-up ?Return in about 1 week (around 10/17/2021). ? ?[name], PharmD ? ?15 minutes spent face-to-face with the patient during the  encounter. 50% of time spent on education, including signs/sx bleeding and clotting, as well as food and drug interactions with warfarin. 50% of time was spent on fingerprick POC INR sample collection,processing, results determination, and documentation ?

## 2021-10-11 DIAGNOSIS — N186 End stage renal disease: Secondary | ICD-10-CM | POA: Diagnosis not present

## 2021-10-11 DIAGNOSIS — N2581 Secondary hyperparathyroidism of renal origin: Secondary | ICD-10-CM | POA: Diagnosis not present

## 2021-10-11 DIAGNOSIS — Z992 Dependence on renal dialysis: Secondary | ICD-10-CM | POA: Diagnosis not present

## 2021-10-11 DIAGNOSIS — D631 Anemia in chronic kidney disease: Secondary | ICD-10-CM | POA: Diagnosis not present

## 2021-10-14 DIAGNOSIS — Z992 Dependence on renal dialysis: Secondary | ICD-10-CM | POA: Diagnosis not present

## 2021-10-14 DIAGNOSIS — N186 End stage renal disease: Secondary | ICD-10-CM | POA: Diagnosis not present

## 2021-10-14 DIAGNOSIS — N2581 Secondary hyperparathyroidism of renal origin: Secondary | ICD-10-CM | POA: Diagnosis not present

## 2021-10-14 DIAGNOSIS — D631 Anemia in chronic kidney disease: Secondary | ICD-10-CM | POA: Diagnosis not present

## 2021-10-16 DIAGNOSIS — N186 End stage renal disease: Secondary | ICD-10-CM | POA: Diagnosis not present

## 2021-10-16 DIAGNOSIS — Z992 Dependence on renal dialysis: Secondary | ICD-10-CM | POA: Diagnosis not present

## 2021-10-16 DIAGNOSIS — D631 Anemia in chronic kidney disease: Secondary | ICD-10-CM | POA: Diagnosis not present

## 2021-10-16 DIAGNOSIS — N2581 Secondary hyperparathyroidism of renal origin: Secondary | ICD-10-CM | POA: Diagnosis not present

## 2021-10-17 ENCOUNTER — Ambulatory Visit: Payer: Medicare Other | Admitting: Pharmacist

## 2021-10-17 ENCOUNTER — Encounter: Payer: Self-pay | Admitting: Cardiovascular Disease

## 2021-10-17 ENCOUNTER — Ambulatory Visit (INDEPENDENT_AMBULATORY_CARE_PROVIDER_SITE_OTHER): Payer: Medicare Other | Admitting: Cardiovascular Disease

## 2021-10-17 VITALS — BP 146/62 | HR 86 | Ht 65.0 in | Wt 188.4 lb

## 2021-10-17 DIAGNOSIS — I6523 Occlusion and stenosis of bilateral carotid arteries: Secondary | ICD-10-CM

## 2021-10-17 DIAGNOSIS — Z5181 Encounter for therapeutic drug level monitoring: Secondary | ICD-10-CM | POA: Diagnosis not present

## 2021-10-17 DIAGNOSIS — I480 Paroxysmal atrial fibrillation: Secondary | ICD-10-CM

## 2021-10-17 DIAGNOSIS — Z7901 Long term (current) use of anticoagulants: Secondary | ICD-10-CM | POA: Diagnosis not present

## 2021-10-17 LAB — POCT INR: INR: 2.5 (ref 2.0–3.0)

## 2021-10-17 MED ORDER — METOPROLOL TARTRATE 50 MG PO TABS: ORAL_TABLET | ORAL | 0 refills | Status: DC

## 2021-10-17 NOTE — Progress Notes (Signed)
? ?Watchman Consult Note ?Date:  10/17/2021  ? ?ID:  Jane [name], DOB [birth year], MRN [id number] ? ?PCP:  [name], [name], MD  ?Cardiologist:  Adrian Prows, MD ?Primary Electrophysiologist: none ?Referring Physician: Adrian Prows, MD  ? ?CC: to discuss Watchman implant  ?  ?History of Present Illness: ?Jane [name] is a 76 y.o. female referred by Dr Einar Gip for evaluation of atrial fibrillation and stroke prevention. She has permanent atrial fibrillation.  The patient has been evaluated by their referring physician and is felt to be a poor candidate for long term [place] due to recurrent GI bleeding.  She therefore presents today for Watchman evaluation.  ? ?The patient has had atrial fibrillation for many years and has been anticoagulated with warfarin. She has had ESRD since 2014.  In review of outpatient notes, it sounds like she had a cardioversion remotely in 2010 and was maintained on amiodarone at some point.  She unfortunately has had a lot of problems with recurrent gastrointestinal bleeding from the large bowel.  She has had colonoscopy procedures and capsule endoscopy recently.  She has been started back on warfarin within the past few weeks and so far is tolerating it again. ? ?Today, she denies symptoms of palpitations, chest pain, shortness of breath, orthopnea, PND, lower extremity edema, claudication, dizziness, presyncope, syncope, bleeding, or neurologic sequela. The patient is tolerating medications without difficulties and is otherwise without complaint today.  ? ? ?Past Medical History:  ?Diagnosis Date  ? Acid reflux   ? Anemia of chronic disease   ? Arthritis   ? Asthma   ? Atrial fibrillation ([place])   ? Bilateral carotid bruits   ? Complication of anesthesia   ? "hard to wake up, I have sleep apnea" no CPAP  ? Diverticulitis   ? Duodenal ulcer   ? Dysrhythmia   ? Afib  ? ESRD (end stage renal disease) ([place])   ? MWF [place]  ? Headache   ? Heart murmur   ? History of blood transfusion   ?  Hypertension   ? Malaise and fatigue   ? Orthostatic hypotension   ? Shortness of breath   ? " when I walk to fast"  ? Sleep apnea   ? Syncope   ? Tubulovillous adenoma of colon   ? ?Past Surgical History:  ?Procedure Laterality Date  ? A/V FISTULAGRAM Right 10/18/2020  ? Procedure: A/V FISTULAGRAM;  Surgeon: Marty Heck, MD;  Location: [facility];  Service: Cardiovascular;  Laterality: Right;  ? A/V FISTULAGRAM Right 09/05/2021  ? Procedure: A/V Fistulagram;  Surgeon: Marty Heck, MD;  Location: [place] [facility];  Service: Cardiovascular;  Laterality: Right;  ? AV FISTULA PLACEMENT    ? BACK SURGERY    ? Lumbar fusion L 4 and L 5  ? BIOPSY  01/09/2020  ? Procedure: BIOPSY;  Surgeon: Irving Copas., MD;  Location: [facility];  Service: Gastroenterology;;  ? BIOPSY  01/31/2021  ? Procedure: BIOPSY;  Surgeon: Irving Copas., MD;  Location: [place];  Service: Gastroenterology;;  ? BIOPSY  09/26/2021  ? Procedure: BIOPSY;  Surgeon: Thornton Park, MD;  Location: [place];  Service: Gastroenterology;;  ? COLONOSCOPY    ? COLONOSCOPY N/A 02/01/2021  ? Procedure: COLONOSCOPY;  Surgeon: Yetta Flock, MD;  Location: [facility];  Service: Gastroenterology;  Laterality: N/A;  ? COLONOSCOPY WITH PROPOFOL N/A 09/26/2021  ? Procedure: COLONOSCOPY WITH PROPOFOL;  Surgeon: Thornton Park, MD;  Location:  [place] [facility];  Service: Gastroenterology;  Laterality: N/A;  ? COLONOSCOPY WITH PROPOFOL N/A 09/29/2021  ? Procedure: COLONOSCOPY WITH PROPOFOL;  Surgeon: Thornton Park, MD;  Location: [place];  Service: Gastroenterology;  Laterality: N/A;  ? COLONOSCOPY WITH PROPOFOL N/A 10/01/2021  ? Procedure: COLONOSCOPY WITH PROPOFOL;  Surgeon: Mauri Pole, MD;  Location: [place];  Service: Gastroenterology;  Laterality: N/A;  ? ENDOSCOPIC MUCOSAL RESECTION N/A 01/09/2020  ? Procedure: ENDOSCOPIC MUCOSAL RESECTION;  Surgeon: Rush Landmark Telford Nab., MD;   Location: [place];  Service: Gastroenterology;  Laterality: N/A;  ? ENDOSCOPIC MUCOSAL RESECTION N/A 01/31/2021  ? Procedure: ENDOSCOPIC MUCOSAL RESECTION;  Surgeon: Rush Landmark Telford Nab., MD;  Location: [place];  Service: Gastroenterology;  Laterality: N/A;  ? ENTEROSCOPY N/A 09/26/2021  ? Procedure: ENTEROSCOPY;  Surgeon: Thornton Park, MD;  Location: [place];  Service: Gastroenterology;  Laterality: N/A;  ? ESOPHAGOGASTRODUODENOSCOPY    ? ESOPHAGOGASTRODUODENOSCOPY (EGD) WITH PROPOFOL N/A 01/09/2020  ? Procedure: ESOPHAGOGASTRODUODENOSCOPY (EGD) WITH PROPOFOL;  Surgeon: Rush Landmark Telford Nab., MD;  Location: [place];  Service: Gastroenterology;  Laterality: N/A;  ? ESOPHAGOGASTRODUODENOSCOPY (EGD) WITH PROPOFOL N/A 01/13/2020  ? Procedure: ESOPHAGOGASTRODUODENOSCOPY (EGD) WITH PROPOFOL;  Surgeon: Irene Shipper, MD;  Location: [place];  Service: Gastroenterology;  Laterality: N/A;  ? ESOPHAGOGASTRODUODENOSCOPY (EGD) WITH PROPOFOL N/A 01/31/2021  ? Procedure: ESOPHAGOGASTRODUODENOSCOPY (EGD) WITH PROPOFOL;  Surgeon: Rush Landmark Telford Nab., MD;  Location: [place];  Service: Gastroenterology;  Laterality: N/A;  ? EUS N/A 01/09/2020  ? Procedure: UPPER ENDOSCOPIC ULTRASOUND (EUS) RADIAL;  Surgeon: Rush Landmark Telford Nab., MD;  Location: [place];  Service: Gastroenterology;  Laterality: N/A;  ? GIVENS CAPSULE STUDY  09/29/2021  ? Procedure: GIVENS CAPSULE STUDY;  Surgeon: Thornton Park, MD;  Location: [place];  Service: Gastroenterology;;  ? HEMOSTASIS CLIP PLACEMENT  01/09/2020  ? Procedure: HEMOSTASIS CLIP PLACEMENT;  Surgeon: Irving Copas., MD;  Location: [place];  Service: Gastroenterology;;  ? HEMOSTASIS CLIP PLACEMENT  01/31/2021  ? Procedure: HEMOSTASIS CLIP PLACEMENT;  Surgeon: Irving Copas., MD;  Location: [place];  Service: Gastroenterology;;  ? HEMOSTASIS CONTROL  01/13/2020  ? Procedure: HEMOSTASIS CONTROL;  Surgeon: Irene Shipper, MD;   Location: [place];  Service: Gastroenterology;;  [place]ie [place]  ? HOT HEMOSTASIS N/A 01/13/2020  ? Procedure: HOT HEMOSTASIS (ARGON PLASMA COAGULATION/BICAP);  Surgeon: Irene Shipper, MD;  Location: [place];  Service: Gastroenterology;  Laterality: N/A;  ? HOT HEMOSTASIS N/A 02/01/2021  ? Procedure: HOT HEMOSTASIS (ARGON PLASMA COAGULATION/BICAP);  Surgeon: Yetta Flock, MD;  Location: [facility] ENDOSCOPY;  Service: Gastroenterology;  Laterality: N/A;  ? HOT HEMOSTASIS N/A 10/01/2021  ? Procedure: HOT HEMOSTASIS (ARGON PLASMA COAGULATION/BICAP);  Surgeon: Mauri Pole, MD;  Location: [place];  Service: Gastroenterology;  Laterality: N/A;  ? IR GENERIC HISTORICAL  07/09/2016  ? IR US GUIDE VASC ACCESS RIGHT 07/09/2016 Arne Cleveland, MD [facility]  ? IR GENERIC HISTORICAL  07/09/2016  ? IR FLUORO GUIDE CV LINE RIGHT 07/09/2016 Arne Cleveland, MD [facility]  ? KNEE ARTHROPLASTY Left   ? LAPAROSCOPIC SIGMOID COLECTOMY N/A 07/11/2016  ? Procedure: LAPAROSCOPIC SIGMOID COLECTOMY;  Surgeon: Clovis Riley, MD;  Location: [place];  Service: General;  Laterality: N/A;  ? MASS EXCISION Right 04/10/2020  ? Procedure: EXCISION SKIN NODULE RIGHT FOREARM;  Surgeon: Waynetta Sandy, MD;  Location: [place];  Service: Vascular;  Laterality: Right;  ? POLYPECTOMY  09/26/2021  ? Procedure: POLYPECTOMY;  Surgeon: Thornton Park, MD;  Location: [place];  Service: Gastroenterology;;  ? POLYPECTOMY  10/01/2021  ? Procedure: POLYPECTOMY;  Surgeon: Mauri Pole, MD;  Location: [place];  Service: Gastroenterology;;  ? SCLEROTHERAPY  01/09/2020  ? Procedure: SCLEROTHERAPY;  Surgeon: Rush Landmark Telford Nab., MD;  Location: [place];  Service: Gastroenterology;;  ? Clide Deutscher  01/13/2020  ? Procedure: SCLEROTHERAPY;  Surgeon: Irene Shipper, MD;  Location: [place];  Service: Gastroenterology;;  ? SUBMUCOSAL LIFTING INJECTION  01/09/2020  ? Procedure: SUBMUCOSAL LIFTING INJECTION;  Surgeon:  Irving Copas., MD;  Location: [place];  Service: Gastroenterology;;  ? TUBAL LIGATION    ? ? ? ?Current Outpatient Medications  ?Medication Sig Dispense Refill  ? atorvastatin (LIPITOR) 10 MG tablet Take

## 2021-10-17 NOTE — Patient Instructions (Addendum)
Medication Instructions:  ?Your physician recommends that you continue on your current medications as directed. Please refer to the Current Medication list given to you today. ? ?*If you need a refill on your cardiac medications before your next appointment, please call your pharmacy* ? ? ?Lab Work: ?NONE ?If you have labs (blood work) drawn today and your tests are completely normal, you will receive your results only by: ?MyChart Message (if you have MyChart) OR ?A paper copy in the mail ?If you have any lab test that is abnormal or we need to change your treatment, we will call you to review the results. ? ? ?Testing/Procedures: ?Watchman CT scan ?Non-Cardiac CT scanning, (CAT scanning), is a noninvasive, special x-ray that produces cross-sectional images of the body using x-rays and a computer. CT scans help physicians diagnose and treat medical conditions. For some CT exams, a contrast material is used to enhance visibility in the area of the body being studied. CT scans provide greater clarity and reveal more details than regular x-ray exams. ? ? ?Follow-Up: ?Structural Team will follow-up ? ? ?Provider:   ?Sherren Mocha, MD  ? ?  ?WATCHMAN CT INSTRUCTIONS: ?Your WATCHMAN CT will be scheduled at [place] will be called to confirm date and time. ? ?The day of your CT appointment, please enter [facility] through the [employer] [facility] Entrance (Entrance [address] off [address] You may use the FREE valet parking offered at entrance C (encouraged to control the heart rate for the test). Then proceed to the [facility] Radiology Department (first floor) for check-in and test prep 30 minutes prior to your scheduled appointment.  ? ?Please follow these instructions carefully: ? ?On the Night Before the Test: ?Be sure to Drink plenty of water. ?Do not consume any caffeinated/decaffeinated beverages or chocolate 12 hours prior to your test. ?Do not take any antihistamines 12 hours prior to your  test. ?On the Day of the Test: ?Drink plenty of water until 1 hour prior to the test. ?Do not eat any food 4 hours prior to the test. ?You may take your regular medications prior to the test.  ?Take metoprolol (Lopressor) two hours prior to test  ?FEMALES- please wear underwire-free bra if available ?     ?After the Test: ?Drink plenty of water. ?After receiving IV contrast, you may experience a mild flushed feeling. This is normal. ?On occasion, you may experience a mild rash up to 24 hours after the test. This is not dangerous. If this occurs, you can take Benadryl 25 mg and increase your fluid intake. ?If you experience trouble breathing, this can be serious. If it is severe call 911 IMMEDIATELY. If it is mild, please call our office. ?If you take any of these medications: Glipizide/Metformin, Avandament, Glucavance, please do not take 48 hours after completing test unless otherwise instructed. ? ?Once we have confirmed authorization from your insurance company, you will be called to set up a date and time for your test.  ? ?For non-scheduling related questions/concerns about your CT scan, please contact the cardiac imaging nurses: ?Marchia Bond, Cardiac Imaging Nurse Navigator ?Gordy Clement, Cardiac Imaging Nurse Navigator ?[facility] ?Direct Office Dial: [phone]  ? ?For scheduling needs, including cancellations and rescheduling, please call [place], [phone]. ? ?Lenice Llamas, the [employer] Nurse Navigator, will call you after your CT once the Watchman Team has reviewed your imaging for an update on proceedings. [place]'s direct number is [phone] if you need assistance. ? ?

## 2021-10-18 DIAGNOSIS — N186 End stage renal disease: Secondary | ICD-10-CM | POA: Diagnosis not present

## 2021-10-18 DIAGNOSIS — Z992 Dependence on renal dialysis: Secondary | ICD-10-CM | POA: Diagnosis not present

## 2021-10-18 DIAGNOSIS — D631 Anemia in chronic kidney disease: Secondary | ICD-10-CM | POA: Diagnosis not present

## 2021-10-18 DIAGNOSIS — N2581 Secondary hyperparathyroidism of renal origin: Secondary | ICD-10-CM | POA: Diagnosis not present

## 2021-10-18 NOTE — Patient Instructions (Signed)
INR at goal. Continue taking 5 mg daily. Recheck INR in 2 weeks.  ?

## 2021-10-18 NOTE — Progress Notes (Addendum)
Anticoagulation Management ?Jane [name] is a 76 y.o. female who reports to the clinic for monitoring of warfarin treatment.   ? ?Indication: atrial fibrillation CHA2DS2 Vasc Score 4 (Age >63, female, HTN hx), HAS-BLED 2 (Age>65, renal disease)  ?Duration: indefinite ?Supervising physician: Adrian Prows ? ?Anticoagulation Clinic Visit History: ? ?Patient does not report signs/symptoms of bleeding or thromboembolism. ? ?Reports no changes in diet, medications, or lifestyle.  ? ?Pt with recent hospital admission on 09/25/21 - 10/02/21 for acute GI bleed and low Hgb. Pt has held her home warfarin since 09/19/26. Hbg on admission of 7.6. Colonoscopy on 3/9 showed blood throughout her colon without any identifiable source. Endoscopy from 3/7 did show concerns of multiple gastric polyps, with 1 polyp noted to have concerns of recent bleeding. CTA GI bleed study 3/10 negative for active GI bleed. Capsule endoscopy 3/13 showed active oozing right colon or possible terminal ileum. Colonoscopy 3/14: 5 mm polyp transverse colon s/p resection (pathology tubular adenoma without high-grade dysplasia), 3 colonic angiectasias s/p APC.  Patient was transfused 2 units PRBCs during hospitalization. Hemoglobin 7.5 at time of discharge. Pt recommended outpatient GI f/u. Pt gpt repeat Hbg completed through her dialysis caregiver on 10/04/21 showed slight improvement in her Hbg to 8.5. Pt reports that she received a dose of IV iron last Friday.  ? ?Denies any recurrence of GI bleeding concerns since. Pt restarted home warfarin 5 mg daily on 10/08/21. Pt is being considered for possible Watchman procedure in the future with Dr. Burt Knack. New consult appointment scheduled for 10/17/21. Pt with significant past GI bleeding history requiring hospital admission. Had 3 hospital admission for GI bleed management over the past 12 months. ? ?Pt with hx of soft BP and hypotensive symptoms. Pt started to use scheduled midodrine 10 mg BID - TID. Pt notes that she  feels fatigued, lightheaded, dizzy, nauseous whenever her SBP <120s. Noticed improvement in her nausea, lightheaded and dizziness symptoms since taking scheduling her midodrine. Pt recently had her dry weight adjusted through her dialysis center to manage her hypotensive episodes. Instructed pt to use her hydralazine 25 mg PRN if SBP >150 mmHg. Will continue remote BP monitoring.  ? ?Anticoagulation Episode Summary   ? ? Current INR goal:  2.0-3.0  ?TTR:  59.2 % (3 y)  ?Next INR check:  10/31/2021  ?INR from last check:  2.5 (10/17/2021)  ?Weekly max warfarin dose:    ?Target end date:  Indefinite  ?INR check location:    ?Preferred lab:    ?Send INR reminders to:    ? Indications   ?Paroxysmal atrial fibrillation (HCC) [I48.0] ?Monitoring for long-term anticoagulant use [Z51.81 ?Z79.01] ? ?  ?  ? ? Comments:    ?  ? ?  ? ? ?Allergies  ?Allergen Reactions  ? Latex Rash  ? Penicillins Other (See Comments)  ?  Yeast infection / Childhood  ? Sulfa Antibiotics Rash  ? Tape Other (See Comments)  ?  Plastic, silicone, and paper tape causes bruising and pulls off skin. Cloth tape works fine  ? ? ?Current Outpatient Medications:  ?  atorvastatin (LIPITOR) 10 MG tablet, Take 1 tablet (10 mg total) by mouth daily., Disp: 30 tablet, Rfl: 3 ?  B Complex-C-Zn-Folic Acid (DIALYVITE 017 WITH ZINC) 0.8 MG TABS, Take 1 tablet by mouth in the morning., Disp: , Rfl:  ?  CALCITRIOL PO, Take 2 tablets by mouth every Monday, Wednesday, and Friday with hemodialysis., Disp: , Rfl:  ?  ethyl chloride spray,  Apply 1 application topically every Monday, Wednesday, and Friday with hemodialysis., Disp: , Rfl: 12 ?  famotidine (PEPCID) 20 MG tablet, Take 1 tablet (20 mg total) by mouth at bedtime., Disp: 30 tablet, Rfl: 2 ?  linaclotide (LINZESS) 145 MCG CAPS capsule, TAKE 1 CAPSULE BY MOUTH ONCE DAILY BEFORE BREAKFAST (Patient taking differently: Take 145 mcg by mouth daily before breakfast.), Disp: 90 capsule, Rfl: 2 ?  Methoxy PEG-Epoetin Beta  (MIRCERA IJ), Inject 1 Dose into the vein every Monday, Wednesday, and Friday., Disp: , Rfl:  ?  metoprolol tartrate (LOPRESSOR) 50 MG tablet, Take 1 tablet by mouth two hours prior to CT scan, Disp: 1 tablet, Rfl: 0 ?  midodrine (PROAMATINE) 10 MG tablet, Take 1 tablet (10 mg total) by mouth 3 (three) times daily as needed (low BP and dizziness and lightheadedness symptoms.). (Patient taking differently: Take 10 mg by mouth 3 (three) times daily as needed (low BP and dizziness and lightheadedness symptoms. SBP <130 mmHg).), Disp: 90 tablet, Rfl: 3 ?  pantoprazole (PROTONIX) 40 MG tablet, Take 1 tablet (40 mg total) by mouth 2 (two) times daily., Disp: 180 tablet, Rfl: 2 ?  sevelamer carbonate (RENVELA) 800 MG tablet, Take 800 mg by mouth in the morning and at bedtime. With meals, Disp: , Rfl:  ?Past Medical History:  ?Diagnosis Date  ? Acid reflux   ? Anemia of chronic disease   ? Arthritis   ? Asthma   ? Atrial fibrillation ( [place])   ? Bilateral carotid bruits   ? Complication of anesthesia   ? "hard to wake up, I have sleep apnea" no CPAP  ? Diverticulitis   ? Duodenal ulcer   ? Dysrhythmia   ? Afib  ? ESRD (end stage renal disease) ([place])   ? MWF [place]  ? Headache   ? Heart murmur   ? History of blood transfusion   ? Hypertension   ? Malaise and fatigue   ? Orthostatic hypotension   ? Shortness of breath   ? " when I walk to fast"  ? Sleep apnea   ? Syncope   ? Tubulovillous adenoma of colon   ? ?ASSESSMENT ? ?Recent Results: ?The most recent result is correlated with 35 mg per week: ? ?Lab Results  ?Component Value Date  ? INR 2.5 10/17/2021  ? INR 1.3 (A) 10/10/2021  ? INR 1.2 09/25/2021  ? ? ?Anticoagulation Dosing: ?Description   ?INR at goal. Continue taking 5 mg daily. Recheck INR in 2 weeks.  ?  ?  ? ?INR today: Therapeutic. Therapeutic following returning to home warfarin dose. Denies any further complains of hematochezia, melena, hematemesis. Slight improvement in her Hbg since being discharged.  Pt with high bleeding risk in setting of recurrent significant GI bleeding concerns.  ? ?Denies any other relevant changes in diet, medications, or lifestyle. Denies any other relevant complains of bleeding or bruising symptoms. Denies any TIA/stroke like symptoms. Will continue current weekly dose and continue close monitoring and follow up in setting of recent high GI bleed risk  ? ?PLAN ?Weekly dose was unchanged by 0% to 35 mg/week. Continue taking 5 mg daily. Recheck INR in 2 week.  ? ?Patient Instructions  ?INR at goal. Continue taking 5 mg daily. Recheck INR in 2 weeks.  ?Patient advised to contact clinic or seek medical attention if signs/symptoms of bleeding or thromboembolism occur. ? ?Patient verbalized understanding by repeating back information and was advised to contact me if further medication-related questions arise.  ? ?  Follow-up ?Return in about 2 weeks (around 10/31/2021). ? ?[name], PharmD ? ?15 minutes spent face-to-face with the patient during the encounter. 50% of time spent on education, including signs/sx bleeding and clotting, as well as food and drug interactions with warfarin. 50% of time was spent on fingerprick POC INR sample collection,processing, results determination, and documentation ?

## 2021-10-19 DIAGNOSIS — Z992 Dependence on renal dialysis: Secondary | ICD-10-CM | POA: Diagnosis not present

## 2021-10-19 DIAGNOSIS — N186 End stage renal disease: Secondary | ICD-10-CM | POA: Diagnosis not present

## 2021-10-19 DIAGNOSIS — I158 Other secondary hypertension: Secondary | ICD-10-CM | POA: Diagnosis not present

## 2021-10-20 SURGERY — COLONOSCOPY WITH PROPOFOL
Anesthesia: Monitor Anesthesia Care

## 2021-10-21 DIAGNOSIS — N186 End stage renal disease: Secondary | ICD-10-CM | POA: Diagnosis not present

## 2021-10-21 DIAGNOSIS — N2581 Secondary hyperparathyroidism of renal origin: Secondary | ICD-10-CM | POA: Diagnosis not present

## 2021-10-21 DIAGNOSIS — D631 Anemia in chronic kidney disease: Secondary | ICD-10-CM | POA: Diagnosis not present

## 2021-10-21 DIAGNOSIS — Z992 Dependence on renal dialysis: Secondary | ICD-10-CM | POA: Diagnosis not present

## 2021-10-23 DIAGNOSIS — N186 End stage renal disease: Secondary | ICD-10-CM | POA: Diagnosis not present

## 2021-10-23 DIAGNOSIS — Z992 Dependence on renal dialysis: Secondary | ICD-10-CM | POA: Diagnosis not present

## 2021-10-23 DIAGNOSIS — D631 Anemia in chronic kidney disease: Secondary | ICD-10-CM | POA: Diagnosis not present

## 2021-10-23 DIAGNOSIS — N2581 Secondary hyperparathyroidism of renal origin: Secondary | ICD-10-CM | POA: Diagnosis not present

## 2021-10-24 ENCOUNTER — Telehealth: Payer: Medicare Other

## 2021-10-25 DIAGNOSIS — Z992 Dependence on renal dialysis: Secondary | ICD-10-CM | POA: Diagnosis not present

## 2021-10-25 DIAGNOSIS — N186 End stage renal disease: Secondary | ICD-10-CM | POA: Diagnosis not present

## 2021-10-25 DIAGNOSIS — I10 Essential (primary) hypertension: Secondary | ICD-10-CM | POA: Diagnosis not present

## 2021-10-25 DIAGNOSIS — N2581 Secondary hyperparathyroidism of renal origin: Secondary | ICD-10-CM | POA: Diagnosis not present

## 2021-10-25 DIAGNOSIS — I480 Paroxysmal atrial fibrillation: Secondary | ICD-10-CM | POA: Diagnosis not present

## 2021-10-25 DIAGNOSIS — D631 Anemia in chronic kidney disease: Secondary | ICD-10-CM | POA: Diagnosis not present

## 2021-10-28 DIAGNOSIS — N186 End stage renal disease: Secondary | ICD-10-CM | POA: Diagnosis not present

## 2021-10-28 DIAGNOSIS — N2581 Secondary hyperparathyroidism of renal origin: Secondary | ICD-10-CM | POA: Diagnosis not present

## 2021-10-28 DIAGNOSIS — Z992 Dependence on renal dialysis: Secondary | ICD-10-CM | POA: Diagnosis not present

## 2021-10-28 DIAGNOSIS — D631 Anemia in chronic kidney disease: Secondary | ICD-10-CM | POA: Diagnosis not present

## 2021-10-30 ENCOUNTER — Telehealth: Payer: Self-pay | Admitting: Pharmacist

## 2021-10-30 DIAGNOSIS — N2581 Secondary hyperparathyroidism of renal origin: Secondary | ICD-10-CM | POA: Diagnosis not present

## 2021-10-30 DIAGNOSIS — E1129 Type 2 diabetes mellitus with other diabetic kidney complication: Secondary | ICD-10-CM | POA: Diagnosis not present

## 2021-10-30 DIAGNOSIS — Z992 Dependence on renal dialysis: Secondary | ICD-10-CM | POA: Diagnosis not present

## 2021-10-30 DIAGNOSIS — N186 End stage renal disease: Secondary | ICD-10-CM | POA: Diagnosis not present

## 2021-10-30 DIAGNOSIS — D631 Anemia in chronic kidney disease: Secondary | ICD-10-CM | POA: Diagnosis not present

## 2021-10-30 NOTE — Chronic Care Management (AMB) (Signed)
? ? ?  Chronic Care Management ?Pharmacy Assistant  ? ?Name: Jane [name]  MRN: [id number] DOB: [birth year] ? ?Reason for Encounter: No fill report for Midodrine.  ?Last filled 09/18/2021, patient takes this prn and has plenty on hand. ? Also called Dialysis Center to request refill of Dialyvite. ? ?Medications: ?Outpatient Encounter Medications as of 10/30/2021  ?Medication Sig  ? atorvastatin (LIPITOR) 10 MG tablet Take 1 tablet (10 mg total) by mouth daily.  ? B Complex-C-Zn-Folic Acid (DIALYVITE 423 WITH ZINC) 0.8 MG TABS Take 1 tablet by mouth in the morning.  ? CALCITRIOL PO Take 2 tablets by mouth every Monday, Wednesday, and Friday with hemodialysis.  ? ethyl chloride spray Apply 1 application topically every Monday, Wednesday, and Friday with hemodialysis.  ? famotidine (PEPCID) 20 MG tablet Take 1 tablet (20 mg total) by mouth at bedtime.  ? hydrALAZINE (APRESOLINE) 25 MG tablet Take 25 mg by mouth daily as needed (SBP>150 mmHg).  ? linaclotide (LINZESS) 145 MCG CAPS capsule TAKE 1 CAPSULE BY MOUTH ONCE DAILY BEFORE BREAKFAST (Patient taking differently: Take 145 mcg by mouth daily before breakfast.)  ? Methoxy PEG-Epoetin Beta (MIRCERA IJ) Inject 1 Dose into the vein every Monday, Wednesday, and Friday.  ? metoprolol tartrate (LOPRESSOR) 50 MG tablet Take 1 tablet by mouth two hours prior to CT scan  ? midodrine (PROAMATINE) 10 MG tablet Take 1 tablet (10 mg total) by mouth 3 (three) times daily as needed (low BP and dizziness and lightheadedness symptoms.). (Patient taking differently: Take 10 mg by mouth 3 (three) times daily as needed (low BP and dizziness and lightheadedness symptoms. SBP <130 mmHg).)  ? pantoprazole (PROTONIX) 40 MG tablet Take 1 tablet (40 mg total) by mouth 2 (two) times daily.  ? sevelamer carbonate (RENVELA) 800 MG tablet Take 800 mg by mouth in the morning and at bedtime. With meals  ? warfarin (COUMADIN) 5 MG tablet Take 5 mg by mouth daily. Or as directed by coumadin clinic  ? ?No  facility-administered encounter medications on file as of 10/30/2021.  ? ?  ?Gennie Alma CMA  ?Clinical Pharmacist Assistant ?[phone] ? ?

## 2021-10-31 ENCOUNTER — Ambulatory Visit: Payer: Medicare Other | Admitting: Pharmacist

## 2021-10-31 DIAGNOSIS — I480 Paroxysmal atrial fibrillation: Secondary | ICD-10-CM | POA: Diagnosis not present

## 2021-10-31 DIAGNOSIS — Z5181 Encounter for therapeutic drug level monitoring: Secondary | ICD-10-CM

## 2021-10-31 DIAGNOSIS — Z7901 Long term (current) use of anticoagulants: Secondary | ICD-10-CM | POA: Diagnosis not present

## 2021-10-31 LAB — POCT INR: INR: 2.4 (ref 2.0–3.0)

## 2021-10-31 NOTE — Patient Instructions (Signed)
INR at goal. Continue taking 5 mg daily. Recheck INR in 3 weeks.  ?

## 2021-10-31 NOTE — Progress Notes (Signed)
Anticoagulation Management ?Jane [name] is a 76 y.o. female who reports to the clinic for monitoring of warfarin treatment.   ? ?Indication: atrial fibrillation CHA2DS2 Vasc Score 4 (Age >48, female, HTN hx), HAS-BLED 3 (Age>65, renal disease, hx of recurrent GI bleeding)  ?Duration: indefinite ?Supervising physician: Adrian Prows ? ?Anticoagulation Clinic Visit History: ? ?Patient does not report signs/symptoms of bleeding or thromboembolism. ? ?Reports no changes in diet, medications, or lifestyle.  ? ?Pt with recent hospital admission on 09/25/21 - 10/02/21 for acute GI bleed and low Hgb. Pt has held her home warfarin since 09/19/26. Hbg on admission of 7.6. Colonoscopy on 3/9 showed blood throughout her colon without any identifiable source. Endoscopy from 3/7 did show concerns of multiple gastric polyps, with 1 polyp noted to have concerns of recent bleeding. CTA GI bleed study 3/10 negative for active GI bleed. Capsule endoscopy 3/13 showed active oozing right colon or possible terminal ileum. Colonoscopy 3/14: 5 mm polyp transverse colon s/p resection (pathology tubular adenoma without high-grade dysplasia), 3 colonic angiectasias s/p APC.  Patient was transfused 2 units PRBCs during hospitalization. Hemoglobin 7.5 at time of discharge. Pt recommended outpatient GI f/u. Pt had repeat Hbg checked through her dialysis caregiver on 10/04/21 showed slight improvement in her Hbg to 8.5. Follow up Hbg continues to trend up with most recent lab results on 10/25/21 noted to be 9.5. Pt reports that she received another dose of IV iron last Friday.  ? ?Denies any recurrence of GI bleeding concerns since. Pt seen and evaluated by Dr. Burt Knack for [facility] procedure referral. Pt scheduled for a chest CT scan on 11/07/21 for further risk assessment.  ? ?Pt with hx of soft BP and hypotensive symptoms. Pt using PRN midodrine 10 mg BID - TID. Pt notes that she feels fatigued, lightheaded, dizzy, nauseous whenever her SBP <120s. Noticed  improvement in her nausea, lightheaded and dizziness symptoms since scheduling her midodrine. Pt reports that she had a syncopal episode while at dialysis last Wednesday. Pt believes  it was due to her fluid being dilaysized too quickly. Pt notes that she was told that she had hypotensive BP post episodes. Improved with rehydration. Denies any recurrence since. Continues to use her hydralazine 25 mg PRN if SBP >150 mmHg. Avg BP over the past 2 weeks of 145/76. Will continue remote BP monitoring.  ? ?Anticoagulation Episode Summary   ? ? Current INR goal:  2.0-3.0  ?TTR:  59.8 % (3 y)  ?Next INR check:  11/21/2021  ?INR from last check:  2.4 (10/31/2021)  ?Weekly max warfarin dose:    ?Target end date:  Indefinite  ?INR check location:    ?Preferred lab:    ?Send INR reminders to:    ? Indications   ?Paroxysmal atrial fibrillation (HCC) [I48.0] ?Monitoring for long-term anticoagulant use [Z51.81 ?Z79.01] ? ?  ?  ? ? Comments:    ?  ? ?  ? ? ?Allergies  ?Allergen Reactions  ? Latex Rash  ? Penicillins Other (See Comments)  ?  Yeast infection / Childhood  ? Sulfa Antibiotics Rash  ? Tape Other (See Comments)  ?  Plastic, silicone, and paper tape causes bruising and pulls off skin. Cloth tape works fine  ? ? ?Current Outpatient Medications:  ?  atorvastatin (LIPITOR) 10 MG tablet, Take 1 tablet (10 mg total) by mouth daily., Disp: 30 tablet, Rfl: 3 ?  B Complex-C-Zn-Folic Acid (DIALYVITE 834 WITH ZINC) 0.8 MG TABS, Take 1 tablet by mouth in  the morning., Disp: , Rfl:  ?  CALCITRIOL PO, Take 2 tablets by mouth every Monday, Wednesday, and Friday with hemodialysis., Disp: , Rfl:  ?  ethyl chloride spray, Apply 1 application topically every Monday, Wednesday, and Friday with hemodialysis., Disp: , Rfl: 12 ?  famotidine (PEPCID) 20 MG tablet, Take 1 tablet (20 mg total) by mouth at bedtime., Disp: 30 tablet, Rfl: 2 ?  hydrALAZINE (APRESOLINE) 25 MG tablet, Take 25 mg by mouth daily as needed (SBP>150 mmHg)., Disp: , Rfl:  ?   linaclotide (LINZESS) 145 MCG CAPS capsule, TAKE 1 CAPSULE BY MOUTH ONCE DAILY BEFORE BREAKFAST (Patient taking differently: Take 145 mcg by mouth daily before breakfast.), Disp: 90 capsule, Rfl: 2 ?  Methoxy PEG-Epoetin Beta (MIRCERA IJ), Inject 1 Dose into the vein every Monday, Wednesday, and Friday., Disp: , Rfl:  ?  metoprolol tartrate (LOPRESSOR) 50 MG tablet, Take 1 tablet by mouth two hours prior to CT scan, Disp: 1 tablet, Rfl: 0 ?  midodrine (PROAMATINE) 10 MG tablet, Take 1 tablet (10 mg total) by mouth 3 (three) times daily as needed (low BP and dizziness and lightheadedness symptoms.). (Patient taking differently: Take 10 mg by mouth 3 (three) times daily as needed (low BP and dizziness and lightheadedness symptoms. SBP <130 mmHg).), Disp: 90 tablet, Rfl: 3 ?  pantoprazole (PROTONIX) 40 MG tablet, Take 1 tablet (40 mg total) by mouth 2 (two) times daily., Disp: 180 tablet, Rfl: 2 ?  sevelamer carbonate (RENVELA) 800 MG tablet, Take 800 mg by mouth in the morning and at bedtime. With meals, Disp: , Rfl:  ?  warfarin (COUMADIN) 5 MG tablet, Take 5 mg by mouth daily. Or as directed by coumadin clinic, Disp: , Rfl:  ?Past Medical History:  ?Diagnosis Date  ? Acid reflux   ? Anemia of chronic disease   ? Arthritis   ? Asthma   ? Atrial fibrillation ([place])   ? Bilateral carotid bruits   ? Complication of anesthesia   ? "hard to wake up, I have sleep apnea" no CPAP  ? Diverticulitis   ? Duodenal ulcer   ? Dysrhythmia   ? Afib  ? ESRD (end stage renal disease) ([place])   ? [facility] [place]  ? Headache   ? Heart murmur   ? History of blood transfusion   ? Hypertension   ? Malaise and fatigue   ? Orthostatic hypotension   ? Shortness of breath   ? " when I walk to fast"  ? Sleep apnea   ? Syncope   ? Tubulovillous adenoma of colon   ? ?ASSESSMENT ? ?Recent Results: ?The most recent result is correlated with 35 mg per week: ? ?Lab Results  ?Component Value Date  ? INR 2.4 10/31/2021  ? INR 2.5 10/17/2021  ? INR 1.3  (A) 10/10/2021  ? ? ?Anticoagulation Dosing: ?Description   ?INR at goal. Continue taking 5 mg daily. Recheck INR in 3 weeks.  ?  ?  ? ?INR today: Therapeutic. INR continues to remain therapeutic on current weekly dose of 35 mg/week. Denies any further complains of hematochezia, melena, hematemesis. Hbg continues to trend up on IV iron. Pt with high bleeding risk in setting of recurrent significant GI bleeding concerns. Being worked up for possible watchman procedure in the near future.  ? ?Denies any other relevant changes in diet, medications, or lifestyle. Denies any other relevant complains of bleeding or bruising symptoms. Denies any TIA/stroke like symptoms. Will continue current weekly dose  and continue close monitoring and follow up in setting of recent high GI bleed risk  ? ?PLAN ?Weekly dose was unchanged by 0% to 35 mg/week. Continue taking 5 mg daily. Recheck INR in 3 week.  ? ?Patient Instructions  ?INR at goal. Continue taking 5 mg daily. Recheck INR in 3 weeks.  ?Patient advised to contact clinic or seek medical attention if signs/symptoms of bleeding or thromboembolism occur. ? ?Patient verbalized understanding by repeating back information and was advised to contact me if further medication-related questions arise.  ? ?Follow-up ?Return in about 3 weeks (around 11/21/2021). ? ?[name], PharmD ? ?15 minutes spent face-to-face with the patient during the encounter. 50% of time spent on education, including signs/sx bleeding and clotting, as well as food and drug interactions with warfarin. 50% of time was spent on fingerprick POC INR sample collection,processing, results determination, and documentation ?

## 2021-11-01 DIAGNOSIS — N2581 Secondary hyperparathyroidism of renal origin: Secondary | ICD-10-CM | POA: Diagnosis not present

## 2021-11-01 DIAGNOSIS — Z992 Dependence on renal dialysis: Secondary | ICD-10-CM | POA: Diagnosis not present

## 2021-11-01 DIAGNOSIS — D631 Anemia in chronic kidney disease: Secondary | ICD-10-CM | POA: Diagnosis not present

## 2021-11-01 DIAGNOSIS — N186 End stage renal disease: Secondary | ICD-10-CM | POA: Diagnosis not present

## 2021-11-04 ENCOUNTER — Other Ambulatory Visit: Payer: Self-pay | Admitting: Cardiology

## 2021-11-04 ENCOUNTER — Other Ambulatory Visit: Payer: Self-pay | Admitting: Cardiovascular Disease

## 2021-11-04 DIAGNOSIS — N186 End stage renal disease: Secondary | ICD-10-CM | POA: Diagnosis not present

## 2021-11-04 DIAGNOSIS — D631 Anemia in chronic kidney disease: Secondary | ICD-10-CM | POA: Diagnosis not present

## 2021-11-04 DIAGNOSIS — Z992 Dependence on renal dialysis: Secondary | ICD-10-CM | POA: Diagnosis not present

## 2021-11-04 DIAGNOSIS — I6523 Occlusion and stenosis of bilateral carotid arteries: Secondary | ICD-10-CM

## 2021-11-04 DIAGNOSIS — N2581 Secondary hyperparathyroidism of renal origin: Secondary | ICD-10-CM | POA: Diagnosis not present

## 2021-11-05 ENCOUNTER — Telehealth: Payer: Self-pay | Admitting: Pharmacist

## 2021-11-05 ENCOUNTER — Telehealth: Payer: Self-pay | Admitting: Family Medicine

## 2021-11-05 NOTE — Chronic Care Management (AMB) (Signed)
? ? ?Chronic Care Management ?Pharmacy Assistant  ? ?Name: Jane [name]  MRN: [id number] DOB: 09-Jul-1946 ? ?Reason for Encounter: Medication Review / Medication Coordination Call ?  ?Conditions to be addressed/monitored: ?HTN ? ?Recent office visits:  ?09/24/2021 [name] MD - Patient was seen for Gastroesophageal reflux disease without esophagitis and additional issues. No medication changes. Return if symptoms worsen or fail to improve. ? ?Recent consult visits:  ?10/17/2021 Sherren Mocha MD (cardiology) - Patient was seen for paroxysmal atrial fibrillation. Started Metoprolol Tartrate 50 mg 1 tablet 2 hours prior to scan. No follow up noted.  ? ?09/24/2021 Adrian Prows MD(cardiology) - Patient was seen for paroxysmal atrial fibrillation and additional issues. No medication changes. Follow up in 2 weeks.  ? ?09/19/2021 Carl Best NP (GI) - Patient was seen for ESRD on dialysis and additional issues. Discontinued cyclobenzaprine. No follow up noted.  ? ?Hospital visits:  ?Admitted to [facility] on 09/25/2021 due to Acute GI bleeding. Discharge date was 10/02/2021.   ?New?Medications Started at [facility] Discharge:?? ?famotidine (PEPCID) ?Medication Changes at Hospital Discharge: ?No medication changes ?Medications Discontinued at Hospital Discharge: ?warfarin 5 MG tablet (COUMADIN) ?Medications that remain the same after Hospital Discharge:??  ?-All other medications will remain the same.   ? ?Medications: ?Outpatient Encounter Medications as of 11/05/2021  ?Medication Sig  ? atorvastatin (LIPITOR) 10 MG tablet TAKE ONE TABLET BY MOUTH ONCE DAILY  ? B Complex-C-Zn-Folic Acid (DIALYVITE 097 WITH ZINC) 0.8 MG TABS Take 1 tablet by mouth in the morning.  ? CALCITRIOL PO Take 2 tablets by mouth every Monday, Wednesday, and Friday with hemodialysis.  ? ethyl chloride spray Apply 1 application topically every Monday, Wednesday, and Friday with hemodialysis.  ? famotidine (PEPCID) 20 MG tablet  Take 1 tablet (20 mg total) by mouth at bedtime.  ? hydrALAZINE (APRESOLINE) 25 MG tablet Take 25 mg by mouth daily as needed (SBP>150 mmHg).  ? linaclotide (LINZESS) 145 MCG CAPS capsule TAKE 1 CAPSULE BY MOUTH ONCE DAILY BEFORE BREAKFAST (Patient taking differently: Take 145 mcg by mouth daily before breakfast.)  ? Methoxy PEG-Epoetin Beta (MIRCERA IJ) Inject 1 Dose into the vein every Monday, Wednesday, and Friday.  ? metoprolol tartrate (LOPRESSOR) 50 MG tablet Take 1 tablet by mouth two hours prior to CT scan  ? midodrine (PROAMATINE) 10 MG tablet Take 1 tablet (10 mg total) by mouth 3 (three) times daily as needed (low BP and dizziness and lightheadedness symptoms.). (Patient taking differently: Take 10 mg by mouth 3 (three) times daily as needed (low BP and dizziness and lightheadedness symptoms. SBP <130 mmHg).)  ? pantoprazole (PROTONIX) 40 MG tablet Take 1 tablet (40 mg total) by mouth 2 (two) times daily.  ? sevelamer carbonate (RENVELA) 800 MG tablet Take 800 mg by mouth in the morning and at bedtime. With meals  ? warfarin (COUMADIN) 5 MG tablet Take 5 mg by mouth daily. Or as directed by coumadin clinic  ? ?No facility-administered encounter medications on file as of 11/05/2021.  ? ?Reviewed chart for medication changes ahead of medication coordination call. ? ?No OVs, Consults, or hospital visits since last care coordination call/Pharmacist visit. (If appropriate, list visit date, provider name) ? ?No medication changes indicated OR if recent visit, treatment plan here. ? ?BP Readings from Last 3 Encounters:  ?10/17/21 (!) 146/62  ?10/02/21 (!) 105/55  ?09/24/21 (!) 143/60  ?  ?No results found for: HGBA1C  ? ?Patient obtains medications through Vials  30 Days  ? ?  Last adherence delivery included: new onboard ? ?Patient declined (meds) last month: No medications declined ? ? ?Patient is due for next adherence delivery on: 11/14/2021. ? ?Called patient and reviewed medications and coordinated  delivery. ? ?This delivery to include: ?Atorvastatin 10 mg - 1 tablet once daily ?Linzess 145 mcg - 1 capsule before breakfast ?Pantoprazole 40 mg - 1 tablet twice daily ?Famotidine 20 mg - 1 tablet at bedtime ?Warfarin 5 mg - 1 tablet once daily as directed.  ?Dialyvite 800 with zinc - take 1 tablet every morning (requ[facility] script [facility] [phone].) ? ?Patient will need a short fill: Patient denies needing a short fill ? ?Coordinated acute fill: Patient denies any acute fills.  ? ?Patient declined the following medications: ?Metoprolol - patient states she is no longer taking this ?Hydralazine - patient states she is no longer taking this ?Renvela is supplied at the dialysis center ?Midodrine 10 mg - patient takes prn, has plenty on hand ? ?Confirmed delivery date of 11/14/2021, advised patient that pharmacy will contact them the morning of delivery. ? ? ?Care Gaps: ?AWV - scheduled for 08/12/2022 ?Last BP - 146/62 on 10/17/2021 ?Shingrix - never done ?Covid vaccine - overdue ?  ?Star Rating Drug: ?Atorvastatin 10 mg - last filled 09/12/2021 63 DS at [facility] ? ?Gennie Alma CMA  ?Clinical Pharmacist Assistant ?[phone] ? ?

## 2021-11-05 NOTE — Telephone Encounter (Signed)
Appointment was with the social worker, not pcp.  ?

## 2021-11-05 NOTE — Telephone Encounter (Signed)
Patient called because she received a no show letter but she states that on  10/24/21 no one called her for her appointment. She states that she was home all day because she was sick and could not walk so she could not have missed the call ? ? ? ? ? ? ? ?Please advise  ?

## 2021-11-06 ENCOUNTER — Telehealth (HOSPITAL_COMMUNITY): Payer: Self-pay | Admitting: *Deleted

## 2021-11-06 DIAGNOSIS — N2581 Secondary hyperparathyroidism of renal origin: Secondary | ICD-10-CM | POA: Diagnosis not present

## 2021-11-06 DIAGNOSIS — D631 Anemia in chronic kidney disease: Secondary | ICD-10-CM | POA: Diagnosis not present

## 2021-11-06 DIAGNOSIS — Z992 Dependence on renal dialysis: Secondary | ICD-10-CM | POA: Diagnosis not present

## 2021-11-06 DIAGNOSIS — N186 End stage renal disease: Secondary | ICD-10-CM | POA: Diagnosis not present

## 2021-11-06 NOTE — Telephone Encounter (Signed)
Reaching out to patient to offer assistance regarding upcoming cardiac imaging study; pt verbalizes understanding of appt date/time, parking situation and where to check in, pre-test NPO status and medications ordered; name and call back number provided for further questions should they arise ? ?Gordy Clement RN Navigator Cardiac Imaging ?[facility] ?[phone] office ?[phone] cell ? ?Patient to take '50mg'$  metoprolol tartrate two hours prior to her cardiac CT. She is aware to arrive a 7:15am. ?

## 2021-11-07 ENCOUNTER — Ambulatory Visit (HOSPITAL_COMMUNITY)
Admission: RE | Admit: 2021-11-07 | Discharge: 2021-11-07 | Disposition: A | Discharge status: Home / Self Care | Payer: Medicare Other | Source: Ambulatory Visit | Attending: Cardiovascular Disease | Admitting: Cardiovascular Disease

## 2021-11-07 DIAGNOSIS — I480 Paroxysmal atrial fibrillation: Secondary | ICD-10-CM | POA: Insufficient documentation

## 2021-11-07 MED ORDER — IOHEXOL 350 MG/ML SOLN
100.0000 mL | Freq: Once | INTRAVENOUS | Status: AC | PRN
  Administered 2021-11-07: 100 mL via INTRAVENOUS

## 2021-11-08 DIAGNOSIS — D631 Anemia in chronic kidney disease: Secondary | ICD-10-CM | POA: Diagnosis not present

## 2021-11-08 DIAGNOSIS — Z992 Dependence on renal dialysis: Secondary | ICD-10-CM | POA: Diagnosis not present

## 2021-11-08 DIAGNOSIS — N186 End stage renal disease: Secondary | ICD-10-CM | POA: Diagnosis not present

## 2021-11-08 DIAGNOSIS — N2581 Secondary hyperparathyroidism of renal origin: Secondary | ICD-10-CM | POA: Diagnosis not present

## 2021-11-11 DIAGNOSIS — N2581 Secondary hyperparathyroidism of renal origin: Secondary | ICD-10-CM | POA: Diagnosis not present

## 2021-11-11 DIAGNOSIS — Z992 Dependence on renal dialysis: Secondary | ICD-10-CM | POA: Diagnosis not present

## 2021-11-11 DIAGNOSIS — N186 End stage renal disease: Secondary | ICD-10-CM | POA: Diagnosis not present

## 2021-11-11 DIAGNOSIS — D631 Anemia in chronic kidney disease: Secondary | ICD-10-CM | POA: Diagnosis not present

## 2021-11-11 NOTE — Progress Notes (Signed)
? ?HPI: ?[name] is a 76 y.o. female, who is here today for follow up. ?She was last seen on 09/24/21, when she had a few concerns, today she is feeling better. ? ?Atrial fib with a CHAD2DS2 Vasc Score 4:She is back on coumadin, has not had rectal bleed since 10/02/21.  ?Pending to hear from cardiologist about watchman procedure. ?She recently had cardiac CT to help determine if she is a good candidate for procedure. ?Still having episodes of hypotension after dialysis but not as bad since dry wt and time were adjusted. ?She is eating better, increased protein intake, has gained some wt. ? ?SBP sometimes 200's right before dialysis. ?Negative for CP,dyspnea,orthopnea,PND,focal neurologic deficit, or edema. ?She is not longer on antihypertensives. ?She is on Midodrine 10 mg tid prn for episodes of hypotension. ? ?Intermittent episodes of GI bleeding, melena and red blood. ?Hospitalized from 09/25/21 and discharged 10/02/21 due to GI bleed. ? ?Small bowel enteroscopy 3/9: Normal esophagus, multiple gastric polyps with 1 polyp evidence for recent bleeding s/p resection, nodular mucosa to duodenum s/p biopsy (pathology with focal erosion and gastric polyp with focal inflammation), no evidence for recent significant GI bleeding.  ?CTA GI Bleed study 3/10: No evidence of active GI bleeding.  ?Capsule endoscopy 3/13: Evidence of active oozing right colon or possible terminal ileum.  ?Colonoscopy 3/14: 5 mm polyp transverse colon s/p resection (pathology tubular adenoma without high-grade dysplasia), 3 colonic angiectasias s/p APC.  Patient was transfused 2 units PRBCs during hospitalization.   ?She has had labs done since hospital discharge. ? ?Lab Results  ?Component Value Date  ? WBC 6.2 10/02/2021  ? HGB 7.5 (L) 10/02/2021  ? HCT 23.4 (L) 10/02/2021  ? MCV 94.4 10/02/2021  ? PLT 137 (L) 10/02/2021  ? ?She has blood work done at the dialysis center every other week. ?Heartburn has improved, she is on Prilosec 40 mg  bid. ?Constipation: On Linzess 290 mcg q 3 days. ? ?Lower back pain is still a problem, sudden onset and limits daily activities. Since her last visit she had a fall, sudden lower back pain,lost balance and fell. No serious injury.She did not seek medical attention. ?She is planning on SI injections. ?Follows with Dr Letta Pate, has an appt on 11/14/21. ? ?Depression: Exacerbated by chronic medical problems, she would like to be able to go back to [place], closer to her children.In general she is feeling better than last visit, she does not like to go to the dialysis center but understands she needs to go and planning on continuing doing so. ?She tried Duloxetine, to also help with back pain, did not feel it helped. ?States that she receives a call once per week, she thinks from Education officer, museum, not sure.They talk for about an hour and this really helps her relieve stress, she really enjoys this tel encounters. ? ? ?  11/14/2021  ? 12:56 PM 11/12/2021  ? 10:31 AM 09/24/2021  ?  1:56 PM 08/06/2021  ? 10:07 AM 08/02/2021  ? 10:23 AM  ?Depression screen PHQ 2/9  ?Decreased Interest 0 0 3 0 0  ?Down, Depressed, Hopeless 0 0 3 0 0  ?PHQ - 2 Score 0 0 6 0 0  ?Altered sleeping  3 3    ?Tired, decreased energy  3 3    ?Change in appetite  0 2    ?Feeling bad or failure about yourself   0 0    ?Trouble concentrating  0 0    ?Moving slowly  or fidgety/restless  0 3    ?Suicidal thoughts  0 0    ?PHQ-9 Score  6 17    ?Difficult doing work/chores  Not difficult at all Very difficult    ? ?Review of Systems  ?Constitutional:  Positive for fatigue. Negative for activity change and fever.  ?HENT:  Negative for mouth sores, nosebleeds and sore throat.   ?Eyes:  Negative for redness and visual disturbance.  ?Respiratory:  Negative for cough and wheezing.   ?Gastrointestinal:  Negative for abdominal pain, nausea and vomiting.  ?     Negative for changes in bowel habits.  ?Musculoskeletal:  Positive for arthralgias and back pain.   ?Neurological:  Negative for syncope, weakness and headaches.  ?Rest see pertinent positives and negatives per HPI. ? ?Current Outpatient Medications on File Prior to Visit  ?Medication Sig Dispense Refill  ? atorvastatin (LIPITOR) 10 MG tablet TAKE ONE TABLET BY MOUTH ONCE DAILY 30 tablet 3  ? B Complex-C-Zn-Folic Acid (DIALYVITE 211 WITH ZINC) 0.8 MG TABS Take 1 tablet by mouth in the morning.    ? CALCITRIOL PO Take 2 tablets by mouth every Monday, Wednesday, and Friday with hemodialysis.    ? ethyl chloride spray Apply 1 application topically every Monday, Wednesday, and Friday with hemodialysis.  12  ? famotidine (PEPCID) 20 MG tablet Take 1 tablet (20 mg total) by mouth at bedtime. 30 tablet 2  ? linaclotide (LINZESS) 145 MCG CAPS capsule TAKE 1 CAPSULE BY MOUTH ONCE DAILY BEFORE BREAKFAST (Patient taking differently: Take 145 mcg by mouth daily before breakfast.) 90 capsule 2  ? Methoxy PEG-Epoetin Beta (MIRCERA IJ) Inject 1 Dose into the vein every Monday, Wednesday, and Friday.    ? midodrine (PROAMATINE) 10 MG tablet Take 1 tablet (10 mg total) by mouth 3 (three) times daily as needed (low BP and dizziness and lightheadedness symptoms.). (Patient taking differently: Take 10 mg by mouth 3 (three) times daily as needed (low BP and dizziness and lightheadedness symptoms. SBP <130 mmHg).) 90 tablet 3  ? pantoprazole (PROTONIX) 40 MG tablet Take 1 tablet (40 mg total) by mouth 2 (two) times daily. 180 tablet 2  ? sevelamer carbonate (RENVELA) 800 MG tablet Take 800 mg by mouth in the morning and at bedtime. With meals    ? warfarin (COUMADIN) 5 MG tablet Take 5 mg by mouth daily. Or as directed by coumadin clinic    ? ?No current facility-administered medications on file prior to visit.  ? ? ?Past Medical History:  ?Diagnosis Date  ? Acid reflux   ? Anemia of chronic disease   ? Arthritis   ? Asthma   ? Atrial fibrillation ([place])   ? Bilateral carotid bruits   ? Complication of anesthesia   ? "hard to wake up,  I have sleep apnea" no CPAP  ? Diverticulitis   ? Duodenal ulcer   ? Dysrhythmia   ? Afib  ? ESRD (end stage renal disease) ([place])   ? [facility] [place]  ? Headache   ? Heart murmur   ? History of blood transfusion   ? Hypertension   ? Malaise and fatigue   ? Orthostatic hypotension   ? Shortness of breath   ? " when I walk to fast"  ? Sleep apnea   ? Syncope   ? Tubulovillous adenoma of colon   ? ?Allergies  ?Allergen Reactions  ? Latex Rash  ? Penicillins Other (See Comments)  ?  Yeast infection / Childhood  ?  Sulfa Antibiotics Rash  ? Tape Other (See Comments)  ?  Plastic, silicone, and paper tape causes bruising and pulls off skin. Cloth tape works fine  ? ? ?Social History  ? ?Socioeconomic History  ? Marital status: Divorced  ?  Spouse name: Not on file  ? Number of children: 4  ? Years of education: 58  ? Highest education level: Associate degree: occupational, Hotel manager, or vocational program  ?Occupational History  ? Occupation: Retired  ?Tobacco Use  ? Smoking status: Never  ?  Passive exposure: Never  ? Smokeless tobacco: Never  ?Vaping Use  ? Vaping Use: Never used  ?Substance and Sexual Activity  ? Alcohol use: No  ? Drug use: No  ? Sexual activity: Never  ?Other Topics Concern  ? Not on file  ?Social History Narrative  ? HH 1  ? Divorced  ? Outpatient dialysis Mon, Wed, Fri  ? 4 children: 1 daughter locally is an Designer, multimedia and 3 sons in [place]  ? ?Social Determinants of Health  ? ?Financial Resource Strain: High Risk  ? Difficulty of Paying Living Expenses: Very hard  ?Food Insecurity: Food Insecurity Present  ? Worried About Charity fundraiser in the Last Year: Often true  ? Ran Out of Food in the Last Year: Often true  ?Transportation Needs: Unmet Transportation Needs  ? Lack of Transportation (Medical): Yes  ? Lack of Transportation (Non-Medical): No  ?Physical Activity: Inactive  ? Days of Exercise per Week: 0 days  ? Minutes of Exercise per Session: 0 min  ?Stress: Stress Concern Present  ?  Feeling of Stress : Very much  ?Social Connections: Not on file  ? ? ?Vitals:  ? 11/12/21 1017  ?BP: 138/80  ?Pulse: 60  ?Resp: 16  ?SpO2: 97%  ? ?Body mass index is 31.7 kg/m?. ? ?Physical Exam ?Vitals and nursing n

## 2021-11-12 ENCOUNTER — Encounter: Payer: Self-pay | Admitting: Family Medicine

## 2021-11-12 ENCOUNTER — Ambulatory Visit (INDEPENDENT_AMBULATORY_CARE_PROVIDER_SITE_OTHER): Payer: Medicare Other | Admitting: Family Medicine

## 2021-11-12 ENCOUNTER — Ambulatory Visit: Payer: Medicare Other | Admitting: Physical Medicine & Rehabilitation

## 2021-11-12 VITALS — BP 138/80 | HR 60 | Resp 16 | Ht 65.0 in | Wt 190.5 lb

## 2021-11-12 DIAGNOSIS — F330 Major depressive disorder, recurrent, mild: Secondary | ICD-10-CM | POA: Diagnosis not present

## 2021-11-12 DIAGNOSIS — I6523 Occlusion and stenosis of bilateral carotid arteries: Secondary | ICD-10-CM | POA: Diagnosis not present

## 2021-11-12 DIAGNOSIS — R296 Repeated falls: Secondary | ICD-10-CM

## 2021-11-12 DIAGNOSIS — I129 Hypertensive chronic kidney disease with stage 1 through stage 4 chronic kidney disease, or unspecified chronic kidney disease: Secondary | ICD-10-CM

## 2021-11-12 DIAGNOSIS — I480 Paroxysmal atrial fibrillation: Secondary | ICD-10-CM

## 2021-11-12 NOTE — Patient Instructions (Addendum)
A few things to remember from today's visit: ? ?Hypertension with renal disease ? ?Paroxysmal atrial fibrillation (HCC) ? ?Frequent falls ? ?If you need refills please call your pharmacy. ?Do not use My Chart to request refills or for acute issues that need immediate attention. ?  ?No changes today. ?Fall precautions to continue. ?Continue following with orthopedics. ?I will see you back in 6 months. ? ?Please be sure medication list is accurate. ?If a new problem present, please set up appointment sooner than planned today. ? ?

## 2021-11-13 DIAGNOSIS — N2581 Secondary hyperparathyroidism of renal origin: Secondary | ICD-10-CM | POA: Diagnosis not present

## 2021-11-13 DIAGNOSIS — D631 Anemia in chronic kidney disease: Secondary | ICD-10-CM | POA: Diagnosis not present

## 2021-11-13 DIAGNOSIS — Z992 Dependence on renal dialysis: Secondary | ICD-10-CM | POA: Diagnosis not present

## 2021-11-13 DIAGNOSIS — N186 End stage renal disease: Secondary | ICD-10-CM | POA: Diagnosis not present

## 2021-11-14 ENCOUNTER — Encounter: Payer: Self-pay | Admitting: Physical Medicine & Rehabilitation

## 2021-11-14 ENCOUNTER — Encounter: Payer: Medicare Other | Attending: Physical Medicine & Rehabilitation | Admitting: Physical Medicine & Rehabilitation

## 2021-11-14 VITALS — BP 184/75 | HR 86 | Ht 65.0 in | Wt 189.8 lb

## 2021-11-14 DIAGNOSIS — M533 Sacrococcygeal disorders, not elsewhere classified: Secondary | ICD-10-CM | POA: Diagnosis not present

## 2021-11-14 NOTE — Patient Instructions (Signed)
Sacroiliac injection was performed today. A combination of numbing medicine (lidocaine) plus a cortisone medicine (betamethasone) was injected. The injection was done under x-ray guidance. This procedure has been performed to help reduce low back and buttocks pain as well as potentially hip pain. The duration of this injection is variable lasting from hours to  Months. It may repeated if needed. 

## 2021-11-14 NOTE — Progress Notes (Signed)
?  PROCEDURE RECORD ?[facility] ? ? ?Name: Jane [name] ?DOB:07/29/1945 ?MRN: [id number] ? ?Date:11/14/2021  Physician: [name], MD   ? ?Nurse/CMA: Jorja Loa MA ? ?Allergies:  ?Allergies  ?Allergen Reactions  ? Latex Rash  ? Penicillins Other (See Comments)  ?  Yeast infection / Childhood  ? Sulfa Antibiotics Rash  ? Tape Other (See Comments)  ?  Plastic, silicone, and paper tape causes bruising and pulls off skin. Cloth tape works fine  ? ? ?Consent Signed: Yes.    Is patient diabetic? No.   ? ?Pregnant: No. LMP: No LMP recorded. Patient is postmenopausal. (age 35-55) ? ?Anticoagulants: yes (Warfarin --taken this morning) ?Anti-inflammatory: no ?Antibiotics: no ? ?Procedure:  Bilateral Sacroiliac injection   Position: Prone ?Start Time: 2:03 pm  End Time: 2:13 PM  Fluoro Time: 41 ? ?RN/CMA Shumaker RN Jerline Pain MA    ?Time 12:54 2:15    ?BP 184/75 179/76    ?Pulse 86 77    ?Respirations 14 16    ?O2 Sat 91 98    ?S/S 6 6    ?Pain Level 10/10 7/10    ? ?D/C home with Daughter, patient A & O X 3, D/C instructions reviewed, and sits independently. ? ? ? ? ? ? ? ?

## 2021-11-14 NOTE — Progress Notes (Signed)

## 2021-11-15 DIAGNOSIS — Z992 Dependence on renal dialysis: Secondary | ICD-10-CM | POA: Diagnosis not present

## 2021-11-15 DIAGNOSIS — N2581 Secondary hyperparathyroidism of renal origin: Secondary | ICD-10-CM | POA: Diagnosis not present

## 2021-11-15 DIAGNOSIS — D631 Anemia in chronic kidney disease: Secondary | ICD-10-CM | POA: Diagnosis not present

## 2021-11-15 DIAGNOSIS — N186 End stage renal disease: Secondary | ICD-10-CM | POA: Diagnosis not present

## 2021-11-18 ENCOUNTER — Telehealth: Payer: Self-pay | Admitting: Pharmacist

## 2021-11-18 DIAGNOSIS — Z992 Dependence on renal dialysis: Secondary | ICD-10-CM | POA: Diagnosis not present

## 2021-11-18 DIAGNOSIS — N2581 Secondary hyperparathyroidism of renal origin: Secondary | ICD-10-CM | POA: Diagnosis not present

## 2021-11-18 DIAGNOSIS — I158 Other secondary hypertension: Secondary | ICD-10-CM | POA: Diagnosis not present

## 2021-11-18 DIAGNOSIS — N186 End stage renal disease: Secondary | ICD-10-CM | POA: Diagnosis not present

## 2021-11-18 NOTE — Telephone Encounter (Signed)
Pt called stating that she had been having persistent Afib concerns over the past two days. Reported having complains of chest tightness, palpitations, SOB, fatigue, and dyspnea symptoms most of day yesterday and this morning. Pt took her 1 dose of diltiazem 60 mg yesterday and this morning, which provided moderate relief. BP readings while on the phone of 110/61 HR: 109. HR has been ranging 106-115s over the past two days.  ? ?Pt currently stable. Reviewed SIG of her previously prescribed diltiazem 60 mg to be taken up to TID PRN for Afib symptoms. Pt scheduled to come in Thursday for INR check. Pt prefers to try taking her PRN diltiazem TID for now and continue monitoring her symptoms. Pt was previously carvedilol 37.5 mg on rate control, but has been held since due to pt having persistent hypotensive BP readings.  ? ?

## 2021-11-20 DIAGNOSIS — N186 End stage renal disease: Secondary | ICD-10-CM | POA: Diagnosis not present

## 2021-11-20 DIAGNOSIS — Z992 Dependence on renal dialysis: Secondary | ICD-10-CM | POA: Diagnosis not present

## 2021-11-20 DIAGNOSIS — N2581 Secondary hyperparathyroidism of renal origin: Secondary | ICD-10-CM | POA: Diagnosis not present

## 2021-11-20 NOTE — Progress Notes (Signed)
Cardiac CT/CTA 11/07/2021: ?1. The left atrial appendage is a large chicken wing morphology without thrombus. ??2. A 31 mm Watchman FLX device is recommended based on the above landing zone measurements (24.6 Maximum diameter; 21% compression). ?3. There is no thrombus in the left atrial appendage. ?4. A mid and mid IAS puncture site is recommended. ?5. Optimal deployment angle: RAO 11 CAU 5 ?6. Normal coronary origin. Right dominance. CAC score of 2276, which is 99th percentile for age-, sex-, and race-matched controls.

## 2021-11-21 ENCOUNTER — Other Ambulatory Visit: Payer: Self-pay | Admitting: Pharmacist

## 2021-11-21 ENCOUNTER — Ambulatory Visit: Payer: Medicare Other | Admitting: Pharmacist

## 2021-11-21 DIAGNOSIS — I480 Paroxysmal atrial fibrillation: Secondary | ICD-10-CM

## 2021-11-21 DIAGNOSIS — Z5181 Encounter for therapeutic drug level monitoring: Secondary | ICD-10-CM

## 2021-11-21 DIAGNOSIS — Z7901 Long term (current) use of anticoagulants: Secondary | ICD-10-CM | POA: Diagnosis not present

## 2021-11-21 LAB — POCT INR: INR: 2.8 (ref 2.0–3.0)

## 2021-11-21 MED ORDER — DILTIAZEM HCL 60 MG PO TABS: 60.0000 mg | ORAL_TABLET | Freq: Three times a day (TID) | ORAL | 1 refills | Status: DC | PRN

## 2021-11-21 MED ORDER — WARFARIN SODIUM 5 MG PO TABS: 5.0000 mg | ORAL_TABLET | Freq: Every day | ORAL | 1 refills | Status: DC

## 2021-11-21 NOTE — Progress Notes (Signed)
Anticoagulation Management ?Jane [name] is a 76 y.o. female who reports to the clinic for monitoring of warfarin treatment.   ? ?Indication: atrial fibrillation CHA2DS2 Vasc Score 4 (Age >76, female, HTN hx), HAS-BLED 3 (Age>65, renal disease, hx of recurrent GI bleeding)  ?Duration: indefinite ?Supervising physician: Adrian Prows ? ?Anticoagulation Clinic Visit History: ? ?Patient does not report signs/symptoms of bleeding or thromboembolism. ? ?Reports no changes in diet, medications, or lifestyle.  ? ?Pt with recent hospital admission on 09/25/21 - 10/02/21 for acute GI bleed and low Hgb. Denies any recurrence of GI bleeding concerns since. Scheduled for GI f/u on 12/03/21. Pt attest pt to having mild indigestion and acid reflex concerns. Planning on discussing her concerns further with Dr. Hilarie Fredrickson.  ? ?Pt seen and evaluated by Dr. Burt Knack for [facility] procedure referral. Chest CT results showed LAA anatomy conducive to Watchman FLX implantation. Pt scheduled to undergo LAAO on 01/09/22  ? ?Pt reports that she did have symptomatic Afib symptoms x2 over the weekend. Used her PRN diltiazem 60 mg that provided moderate relief. Symptoms of chest tightness, palpitations, and SOB noted during the episodes. Last episode on 11/18/21. No recurrence since. Diltiazem 60 mg PRN refilled per pt's request. ? ?Pt with hx of soft BP and hypotensive symptoms. Pt using PRN midodrine 10 mg BID - TID. Pt notes that she feels fatigued, lightheaded, dizzy, nauseous whenever her SBP <120s. Using PRN midodrine for hypotensive episodes. Denies any recurrence in her lightheadedness, dizziness, syncope concerns since. Continues to use her hydralazine 25 mg PRN if SBP >150 mmHg. 2 week remote BP average of 139/80 HR:81. Will continue remote BP monitoring.  ? ?Anticoagulation Episode Summary   ? ? Current INR goal:  2.0-3.0  ?TTR:  60.5 % (3.1 y)  ?Next INR check:  12/12/2021  ?INR from last check:  2.8 (11/21/2021)  ?Weekly max warfarin dose:    ?Target  end date:  Indefinite  ?INR check location:    ?Preferred lab:    ?Send INR reminders to:    ? Indications   ?Paroxysmal atrial fibrillation (HCC) [I48.0] ?Monitoring for long-term anticoagulant use [Z51.81 ?Z79.01] ? ?  ?  ? ? Comments:    ?  ? ?  ? ? ?Allergies  ?Allergen Reactions  ? Latex Rash  ? Penicillins Other (See Comments)  ?  Yeast infection / Childhood  ? Sulfa Antibiotics Rash  ? Tape Other (See Comments)  ?  Plastic, silicone, and paper tape causes bruising and pulls off skin. Cloth tape works fine  ? ? ?Current Outpatient Medications:  ?  diltiazem (CARDIZEM) 60 MG tablet, Take 1 tablet (60 mg total) by mouth 3 (three) times daily as needed (Atrial fibrillation)., Disp: 30 tablet, Rfl: 1 ?  hydrALAZINE (APRESOLINE) 25 MG tablet, Take 25 mg by mouth 3 (three) times daily as needed (SBP >150 mmHg)., Disp: , Rfl:  ?  atorvastatin (LIPITOR) 10 MG tablet, TAKE ONE TABLET BY MOUTH ONCE DAILY, Disp: 30 tablet, Rfl: 3 ?  B Complex-C-Zn-Folic Acid (DIALYVITE 979 WITH ZINC) 0.8 MG TABS, Take 1 tablet by mouth in the morning., Disp: , Rfl:  ?  CALCITRIOL PO, Take 2 tablets by mouth every Monday, Wednesday, and Friday with hemodialysis., Disp: , Rfl:  ?  ethyl chloride spray, Apply 1 application topically every Monday, Wednesday, and Friday with hemodialysis., Disp: , Rfl: 12 ?  famotidine (PEPCID) 20 MG tablet, Take 1 tablet (20 mg total) by mouth at bedtime., Disp: 30 tablet, Rfl: 2 ?  linaclotide (LINZESS) 145 MCG CAPS capsule, TAKE 1 CAPSULE BY MOUTH ONCE DAILY BEFORE BREAKFAST (Patient taking differently: Take 145 mcg by mouth daily before breakfast.), Disp: 90 capsule, Rfl: 2 ?  Methoxy PEG-Epoetin Beta (MIRCERA IJ), Inject 1 Dose into the vein every Monday, Wednesday, and Friday., Disp: , Rfl:  ?  midodrine (PROAMATINE) 10 MG tablet, Take 1 tablet (10 mg total) by mouth 3 (three) times daily as needed (low BP and dizziness and lightheadedness symptoms.). (Patient taking differently: Take 10 mg by mouth 3  (three) times daily as needed (low BP and dizziness and lightheadedness symptoms. SBP <130 mmHg).), Disp: 90 tablet, Rfl: 3 ?  pantoprazole (PROTONIX) 40 MG tablet, Take 1 tablet (40 mg total) by mouth 2 (two) times daily., Disp: 180 tablet, Rfl: 2 ?  sevelamer carbonate (RENVELA) 800 MG tablet, Take 800 mg by mouth in the morning and at bedtime. With meals, Disp: , Rfl:  ?  warfarin (COUMADIN) 5 MG tablet, Take 1 tablet (5 mg total) by mouth daily. Or as directed by coumadin clinic, Disp: 100 tablet, Rfl: 1 ?Past Medical History:  ?Diagnosis Date  ? Acid reflux   ? Anemia of chronic disease   ? Arthritis   ? Asthma   ? Atrial fibrillation ([place])   ? Bilateral carotid bruits   ? Complication of anesthesia   ? "hard to wake up, I have sleep apnea" no CPAP  ? Diverticulitis   ? Duodenal ulcer   ? Dysrhythmia   ? Afib  ? ESRD (end stage renal disease) ([place])   ? M[facility] [place]  ? Headache   ? Heart murmur   ? History of blood transfusion   ? Hypertension   ? Malaise and fatigue   ? Orthostatic hypotension   ? Shortness of breath   ? " when I walk to fast"  ? Sleep apnea   ? Syncope   ? Tubulovillous adenoma of colon   ? ?ASSESSMENT ? ?Recent Results: ?The most recent result is correlated with 35 mg per week: ? ?Lab Results  ?Component Value Date  ? INR 2.8 11/21/2021  ? INR 2.4 10/31/2021  ? INR 2.5 10/17/2021  ? ? ?Anticoagulation Dosing: ?Description   ?INR at goal. Continue taking 5 mg daily. Recheck INR in 3 weeks.  ?  ?  ? ?INR today: Therapeutic. INR continues to remain therapeutic on current weekly dose of 35 mg/week. Denies any further complains of hematochezia, melena, hematemesis. Pt with high bleeding risk in setting of recurrent significant GI bleeding concerns. Being worked up for possible watchman procedure in the near future. Procedure scheduled for 01/09/22 ? ?Denies any other relevant changes in diet, medications, or lifestyle. Denies any other relevant complains of bleeding or bruising symptoms.  Denies any TIA/stroke like symptoms. Will continue current weekly dose and continue close monitoring and follow up   ? ?PLAN ?Weekly dose was unchanged by 0% to 35 mg/week. Continue taking 5 mg daily. Recheck INR in 3 week.  ? ?Patient Instructions  ?INR at goal. Continue taking 5 mg daily. Recheck INR in 3 weeks.  ?Patient advised to contact clinic or seek medical attention if signs/symptoms of bleeding or thromboembolism occur. ? ?Patient verbalized understanding by repeating back information and was advised to contact me if further medication-related questions arise.  ? ?Follow-up ?Return in about 3 weeks (around 12/12/2021). ? ?[name], PharmD ? ?15 minutes spent face-to-face with the patient during the encounter. 50% of time spent on education, including  signs/sx bleeding and clotting, as well as food and drug interactions with warfarin. 50% of time was spent on fingerprick POC INR sample collection,processing, results determination, and documentation ?

## 2021-11-21 NOTE — Patient Instructions (Signed)
INR at goal. Continue taking 5 mg daily. Recheck INR in 3 weeks.  ?

## 2021-11-22 DIAGNOSIS — Z992 Dependence on renal dialysis: Secondary | ICD-10-CM | POA: Diagnosis not present

## 2021-11-22 DIAGNOSIS — N186 End stage renal disease: Secondary | ICD-10-CM | POA: Diagnosis not present

## 2021-11-22 DIAGNOSIS — N2581 Secondary hyperparathyroidism of renal origin: Secondary | ICD-10-CM | POA: Diagnosis not present

## 2021-11-24 DIAGNOSIS — I10 Essential (primary) hypertension: Secondary | ICD-10-CM | POA: Diagnosis not present

## 2021-11-24 DIAGNOSIS — I480 Paroxysmal atrial fibrillation: Secondary | ICD-10-CM | POA: Diagnosis not present

## 2021-11-25 DIAGNOSIS — N186 End stage renal disease: Secondary | ICD-10-CM | POA: Diagnosis not present

## 2021-11-25 DIAGNOSIS — Z992 Dependence on renal dialysis: Secondary | ICD-10-CM | POA: Diagnosis not present

## 2021-11-25 DIAGNOSIS — N2581 Secondary hyperparathyroidism of renal origin: Secondary | ICD-10-CM | POA: Diagnosis not present

## 2021-11-27 DIAGNOSIS — I4891 Unspecified atrial fibrillation: Secondary | ICD-10-CM | POA: Diagnosis not present

## 2021-11-27 DIAGNOSIS — Z992 Dependence on renal dialysis: Secondary | ICD-10-CM | POA: Diagnosis not present

## 2021-11-27 DIAGNOSIS — N2581 Secondary hyperparathyroidism of renal origin: Secondary | ICD-10-CM | POA: Diagnosis not present

## 2021-11-27 DIAGNOSIS — N186 End stage renal disease: Secondary | ICD-10-CM | POA: Diagnosis not present

## 2021-11-29 DIAGNOSIS — N2581 Secondary hyperparathyroidism of renal origin: Secondary | ICD-10-CM | POA: Diagnosis not present

## 2021-11-29 DIAGNOSIS — N186 End stage renal disease: Secondary | ICD-10-CM | POA: Diagnosis not present

## 2021-11-29 DIAGNOSIS — Z992 Dependence on renal dialysis: Secondary | ICD-10-CM | POA: Diagnosis not present

## 2021-12-02 ENCOUNTER — Encounter: Payer: Self-pay | Admitting: *Deleted

## 2021-12-02 DIAGNOSIS — Z992 Dependence on renal dialysis: Secondary | ICD-10-CM | POA: Diagnosis not present

## 2021-12-02 DIAGNOSIS — N186 End stage renal disease: Secondary | ICD-10-CM | POA: Diagnosis not present

## 2021-12-02 DIAGNOSIS — N2581 Secondary hyperparathyroidism of renal origin: Secondary | ICD-10-CM | POA: Diagnosis not present

## 2021-12-03 ENCOUNTER — Ambulatory Visit (INDEPENDENT_AMBULATORY_CARE_PROVIDER_SITE_OTHER): Payer: Medicare Other | Admitting: Internal Medicine

## 2021-12-03 ENCOUNTER — Encounter: Payer: Self-pay | Admitting: Internal Medicine

## 2021-12-03 ENCOUNTER — Telehealth: Payer: Self-pay | Admitting: Pharmacist

## 2021-12-03 VITALS — BP 142/70 | HR 52 | Ht 65.0 in | Wt 189.0 lb

## 2021-12-03 DIAGNOSIS — R6881 Early satiety: Secondary | ICD-10-CM

## 2021-12-03 DIAGNOSIS — R110 Nausea: Secondary | ICD-10-CM | POA: Diagnosis not present

## 2021-12-03 DIAGNOSIS — K5909 Other constipation: Secondary | ICD-10-CM

## 2021-12-03 DIAGNOSIS — K317 Polyp of stomach and duodenum: Secondary | ICD-10-CM | POA: Diagnosis not present

## 2021-12-03 DIAGNOSIS — K219 Gastro-esophageal reflux disease without esophagitis: Secondary | ICD-10-CM | POA: Diagnosis not present

## 2021-12-03 DIAGNOSIS — I6523 Occlusion and stenosis of bilateral carotid arteries: Secondary | ICD-10-CM

## 2021-12-03 DIAGNOSIS — Z992 Dependence on renal dialysis: Secondary | ICD-10-CM | POA: Diagnosis not present

## 2021-12-03 DIAGNOSIS — N186 End stage renal disease: Secondary | ICD-10-CM

## 2021-12-03 DIAGNOSIS — R1013 Epigastric pain: Secondary | ICD-10-CM

## 2021-12-03 MED ORDER — METOCLOPRAMIDE HCL 5 MG PO TABS: 5.0000 mg | ORAL_TABLET | Freq: Three times a day (TID) | ORAL | 2 refills | Status: DC

## 2021-12-03 NOTE — Progress Notes (Signed)
? ?Subjective:  ? ? [name], female    DOB: [birth year], 76 y.o.   MRN: [id number] ? ?HPI ?Clementine Soulliere is a 76 year old with a past medical history of large tubulovillous adenoma of the duodenum status post EMR, colonic angioectasias leading to GI bleed requiring hospitalization with multiple endoscopic procedures in March 2023, GERD, previously treated H. pylori gastritis, hyperplastic gastric polyps, history of multiple adenomatous colonic polyps, ESRD on hemodialysis, atrial fibrillation on warfarin who is here for follow-up.  She is here alone today. ? ?She was hospitalized over a week.  In March 2023 with GI bleeding.  She underwent endoscopic evaluation including small bowel enteroscopy, capsule endoscopy and 3 colonoscopies.  Finally on 14 March Dr. Silverio Decamp was able to identify colonic angioectasias which she treated.  Since that time the patient's had no bleeding. ? ?She reports that most recently she has been dealing with feeling sick on her stomach nearly all the time.  This is in her upper epigastrium.  She has a constant nausea with very infrequent vomiting.  Drinking water or eating ice seems to make this better.  It is a burning characteristic.  She gets early fullness.  Food can at times make symptoms worse.  She is having some regurgitation without true heartburn.  No dysphagia or odynophagia.  Bowel movements have been regular without blood or melena.  She is using Linzess 145 mcg every other day without needing MiraLAX.  She is continuing twice daily pantoprazole and Pepcid at bedtime. ? ?Hemoglobin has come back towards her normal and is being checked frequently with dialysis.  She is taking calcitriol 3 days a week with dialysis, Mircera 3 days a week and sevelamer.  She does remain on warfarin. ? ? ?Review of Systems ?As per HPI, otherwise negative ? ?Current Medications, Allergies, Past Medical History, Past Surgical History, Family History and Social History were reviewed in  [employer] record. ?   ?Objective:  ? Physical Exam ?BP (!) 142/70   Pulse (!) 52   Ht '5\' 5"'$  (1.651 m)   Wt 189 lb (85.7 kg)   BMI 31.45 kg/m?  ?Gen: awake, alert, NAD ?HEENT: anicteric  ?CV: RRR, no mrg ?Pulm: CTA b/l ?Abd: soft, NT/ND, +BS throughout ?Ext: no c/c/e ?Neuro: nonfocal ? ? ?  Latest Ref Rng & Units 10/02/2021  ? 11:13 AM 10/01/2021  ?  5:26 AM 09/30/2021  ?  6:11 PM  ?CBC  ?WBC 4.0 - 10.5 K/uL 6.2      ?Hemoglobin 12.0 - 15.0 g/dL 7.5   7.7   8.5    ?Hematocrit 36.0 - 46.0 % 23.4   23.8   25.4    ?Platelets 150 - 400 K/uL 137      ? ?Most recent labs per nephrology at dialysis ? ? ?   ?Assessment & Plan:  ?76 year old with a past medical history of large tubulovillous adenoma of the duodenum status post EMR, colonic angioectasias leading to GI bleed requiring hospitalization with multiple endoscopic procedures in March 2023, GERD, previously treated H. pylori gastritis, hyperplastic gastric polyps, history of multiple adenomatous colonic polyps, ESRD on hemodialysis, atrial fibrillation on warfarin who is here for follow-up.  ? ?Burning epigastric pain/nausea/early satiety --very recent upper endoscopy which revealed an inflamed hyperplastic gastric polyp which was removed without dysplasia, H. pylori negative by biopsy, hemosiderin deposition in the antrum.  Symptoms suspicious for gastroparesis/slow stomach emptying.  Her acid is definitively suppressed on twice daily PPI and H2  blocker at night.  I am going to avoid Carafate given her ESRD and try her on low-dose metoclopramide for several weeks to see if symptoms improve. ?--Continue pantoprazole 40 mg twice daily AC ?--She can continue famotidine 20 mg nightly ?--Add metoclopramide 5 mg 3 times daily before meals and at bedtime as needed; we discussed the possible neurologic side effects and that she should notify me if she develops tremor or any neurologic/muscular side effect. ?--6-week follow-up with me or Carl Best, NP for this issue ? ?2.  Hyperplastic gastric polyps --benign without dysplasia.  No specific follow-up needed. ? ?3.  GERD --as per #1 ? ?4.  Chronic constipation --continuing Linzess 145 mcg daily to every other day ? ?5.  ESRD --on hemodialysis Monday, Wednesday and Friday.  They will monitor iron and hemoglobin ? ?6.  Chronic angioectasias --treated endoscopically in March 2023.  Monitor clinically for recurrent GI bleeding. ? ?7.  History of duodenal tubulovillous adenoma and colonic adenomas --will not need surveillance colonoscopy based on age after recent colonoscopy as discussed above.  We can discuss the value of surveillance upper endoscopy though biopsies from the duodenal EMR site showed no recurrent adenomatous change.  Repeat upper endoscopy for surveillance of questionable benefit. ? ?40 minutes total spent today including patient facing time, coordination of care, reviewing medical history/procedures/pertinent radiology studies, and documentation of the encounter. ? ? ?

## 2021-12-03 NOTE — Patient Instructions (Signed)
Continue pantoprazole 40 mg twice daily. ? ?Continue pepcid every evening. ? ?Continue Linzess daily. ? ?We have sent the following medications to your pharmacy for you to pick up at your convenience: ?Reglan 5 mg three times daily and at night as needed for nausea and abdominal pain ? ?If you are age 78 or older, your body mass index should be between 23-30. Your Body mass index is 31.45 kg/m?Marland Kitchen If this is out of the aforementioned range listed, please consider follow up with your Primary Care Provider. ? ?If you are age 78 or younger, your body mass index should be between 19-25. Your Body mass index is 31.45 kg/m?Marland Kitchen If this is out of the aformentioned range listed, please consider follow up with your Primary Care Provider.  ? ?________________________________________________________ ? ?The [facility] providers would like to encourage you to use [facility] to communicate with providers for non-urgent requests or questions.  Due to long hold times on the telephone, sending your provider a message by [facility] may be a faster and more efficient way to get a response.  Please allow 48 business hours for a response.  Please remember that this is for non-urgent requests.  ?_______________________________________________________ ? ?Due to recent changes in healthcare laws, you may see the results of your imaging and laboratory studies on MyChart before your provider has had a chance to review them.  We understand that in some cases there may be results that are confusing or concerning to you. Not all laboratory results come back in the same time frame and the provider may be waiting for multiple results in order to interpret others.  Please give [place] 48 hours in order for your provider to thoroughly review all the results before contacting the office for clarification of your results.  ? ?

## 2021-12-03 NOTE — Chronic Care Management (AMB) (Signed)
Chronic Care Management Pharmacy Assistant   Name: Jane [name]  MRN: [id number] DOB: 1945-12-09  Reason for Encounter: Medication Review / Medication Coordination Call   Conditions to be addressed/monitored: GERD  Recent office visits:  11/12/2021 Jane [name] MD - Patient was seen for Hypertension with renal disease and additional issues. Discontinued Hydralazine and Metoprolol. Follow up in 6 months.   Recent consult visits:  11/21/2021 Jane [name] [facility] (anti coag) - Patient was seen for Paroxysmal atrial fibrillation, Monitoring for long-term anticoagulant.  11/14/2021 Jane Penna MD (physical med) - Patient was seen for Sacroiliac joint disease. No medication changes. Follow up in 4 weeks.   Hospital visits:  None  Medications: Outpatient Encounter Medications as of 12/03/2021  Medication Sig   atorvastatin (LIPITOR) 10 MG tablet TAKE ONE TABLET BY MOUTH ONCE DAILY   B Complex-C-Zn-Folic Acid (DIALYVITE 932 WITH ZINC) 0.8 MG TABS Take 1 tablet by mouth in the morning.   CALCITRIOL PO Take 2 tablets by mouth every Monday, Wednesday, and Friday with hemodialysis.   diltiazem (CARDIZEM) 60 MG tablet Take 1 tablet (60 mg total) by mouth 3 (three) times daily as needed (Atrial fibrillation).   ethyl chloride spray Apply 1 application topically every Monday, Wednesday, and Friday with hemodialysis.   famotidine (PEPCID) 20 MG tablet Take 1 tablet (20 mg total) by mouth at bedtime.   hydrALAZINE (APRESOLINE) 25 MG tablet Take 25 mg by mouth 3 (three) times daily as needed (SBP >150 mmHg).   linaclotide (LINZESS) 145 MCG CAPS capsule TAKE 1 CAPSULE BY MOUTH ONCE DAILY BEFORE BREAKFAST (Patient taking differently: Take 145 mcg by mouth daily before breakfast.)   Methoxy PEG-Epoetin Beta (MIRCERA IJ) Inject 1 Dose into the vein every Monday, Wednesday, and Friday.   metoCLOPramide (REGLAN) 5 MG tablet Take 1 tablet (5 mg total) by mouth 4 (four) times daily -  before meals and at  bedtime. As needed   midodrine (PROAMATINE) 10 MG tablet Take 1 tablet (10 mg total) by mouth 3 (three) times daily as needed (low BP and dizziness and lightheadedness symptoms.). (Patient taking differently: Take 10 mg by mouth 3 (three) times daily as needed (low BP and dizziness and lightheadedness symptoms. SBP <130 mmHg).)   pantoprazole (PROTONIX) 40 MG tablet Take 1 tablet (40 mg total) by mouth 2 (two) times daily.   sevelamer carbonate (RENVELA) 800 MG tablet Take 800 mg by mouth in the morning and at bedtime. With meals   warfarin (COUMADIN) 5 MG tablet Take 1 tablet (5 mg total) by mouth daily. Or as directed by coumadin clinic   No facility-administered encounter medications on file as of 12/03/2021.   Reviewed chart for medication changes ahead of medication coordination call.  No OVs, Consults, or hospital visits since last care coordination call/Pharmacist visit. (If appropriate, list visit date, provider name)  No medication changes indicated OR if recent visit, treatment plan here.  BP Readings from Last 3 Encounters:  12/03/21 (!) 142/70  11/14/21 (!) 184/75  11/12/21 138/80    No results found for: HGBA1C   Patient obtains medications through V[facility]  90 Days    Last adherence delivery included: Atorvastatin 10 mg - 1 tablet once daily Linzess 145 mcg - 1 capsule before breakfast Pantoprazole 40 mg - 1 tablet twice daily Famotidine 20 mg - 1 tablet at bedtime Warfarin 5 mg - 1 tablet once daily as directed.  Dialyvite 800 with zinc - take 1 tablet every morning   Patient declined (  meds) last month:  Metoprolol - patient states she is no longer taking this Hydralazine - patient states she is no longer taking this Renvela is supplied at the dialysis center Midodrine 10 mg - patient takes prn, has plenty on hand     Patient is due for next adherence delivery on: 12/13/2021   Called patient and reviewed medications and coordinated delivery.   This delivery to  include: Atorvastatin 10 mg - 1 tablet once daily Linzess 145 mcg - 1 capsule before breakfast Pantoprazole 40 mg - 1 tablet twice daily Famotidine 20 mg - 1 tablet at bedtime Warfarin 5 mg - 1 tablet once daily as directed.  Dialyvite 800 with zinc - take 1 tablet every morning Diltiazem 60 mg  - 1 tablet 3 times daily as needed for Afib  Midodrine 10 mg - 1 tablet 3 times daily as needed  Patient will need a short fill: Patient denies needing a short fill   Coordinated acute fill: Patient denies any acute fills.    Patient declined the following medications: No medications declined  Dr. Zenovia Jarred started patient on Metoclopramide 5 mg, patient is requesting this medication to be filled every 30 days until she knows if Dr. Hilarie Fredrickson will continue with it.   Renvela is supplied at the dialysis center   Confirmed delivery date of 12/13/2021, advised patient that pharmacy will contact them the morning of delivery.    Care Gaps: AWV - scheduled for 08/12/2022 Last BP - 142/70 on 12/03/2021 Covid booster - overdue Shingix - postponed  Star Rating Drugs: Atorvastatin 10 mg - last filled 11/07/2021 30 DS at [facility] [phone]

## 2021-12-04 DIAGNOSIS — Z992 Dependence on renal dialysis: Secondary | ICD-10-CM | POA: Diagnosis not present

## 2021-12-04 DIAGNOSIS — N186 End stage renal disease: Secondary | ICD-10-CM | POA: Diagnosis not present

## 2021-12-04 DIAGNOSIS — N2581 Secondary hyperparathyroidism of renal origin: Secondary | ICD-10-CM | POA: Diagnosis not present

## 2021-12-06 DIAGNOSIS — Z992 Dependence on renal dialysis: Secondary | ICD-10-CM | POA: Diagnosis not present

## 2021-12-06 DIAGNOSIS — N186 End stage renal disease: Secondary | ICD-10-CM | POA: Diagnosis not present

## 2021-12-06 DIAGNOSIS — N2581 Secondary hyperparathyroidism of renal origin: Secondary | ICD-10-CM | POA: Diagnosis not present

## 2021-12-09 ENCOUNTER — Telehealth: Payer: Self-pay

## 2021-12-09 DIAGNOSIS — N2581 Secondary hyperparathyroidism of renal origin: Secondary | ICD-10-CM | POA: Diagnosis not present

## 2021-12-09 DIAGNOSIS — Z992 Dependence on renal dialysis: Secondary | ICD-10-CM | POA: Diagnosis not present

## 2021-12-09 DIAGNOSIS — N186 End stage renal disease: Secondary | ICD-10-CM | POA: Diagnosis not present

## 2021-12-09 NOTE — Telephone Encounter (Signed)
The Cath Lab will now be closed 01/09/2022 for regulatory checks. Called the patient and her daughter. Confirmed her LAAO will now take place 02/06/2022. Her pre-procedure visit will now take place 02/03/2022. Will send MyChart message with new dates/times. They were both grateful for call and agree with plan.

## 2021-12-10 ENCOUNTER — Other Ambulatory Visit: Payer: Self-pay

## 2021-12-10 DIAGNOSIS — K922 Gastrointestinal hemorrhage, unspecified: Secondary | ICD-10-CM

## 2021-12-10 DIAGNOSIS — I480 Paroxysmal atrial fibrillation: Secondary | ICD-10-CM

## 2021-12-10 DIAGNOSIS — Z1231 Encounter for screening mammogram for malignant neoplasm of breast: Secondary | ICD-10-CM | POA: Diagnosis not present

## 2021-12-10 LAB — HM MAMMOGRAPHY

## 2021-12-11 ENCOUNTER — Telehealth: Payer: Self-pay

## 2021-12-11 ENCOUNTER — Encounter: Payer: Self-pay | Admitting: Family Medicine

## 2021-12-11 DIAGNOSIS — N2581 Secondary hyperparathyroidism of renal origin: Secondary | ICD-10-CM | POA: Diagnosis not present

## 2021-12-11 DIAGNOSIS — Z992 Dependence on renal dialysis: Secondary | ICD-10-CM | POA: Diagnosis not present

## 2021-12-11 DIAGNOSIS — N186 End stage renal disease: Secondary | ICD-10-CM | POA: Diagnosis not present

## 2021-12-11 MED ORDER — FAMOTIDINE 20 MG PO TABS
20.0000 mg | ORAL_TABLET | Freq: Every day | ORAL | 2 refills | Status: DC
Start: 2021-12-11 — End: 2022-03-03

## 2021-12-11 NOTE — Telephone Encounter (Signed)
-----   Message from Rodrigo Ran, [place] sent at 12/03/2021 12:44 PM EDT ----- Regarding: FW: Famotidine rx  ----- Message ----- From: Viona Gilmore, [place]: 12/03/2021  12:39 PM EDT To: Rodrigo Ran, CMA Subject: Famotidine rx                                  Hi,  Ms. Ulysse received an rx for famotidine from a hospitalist back in March and they will no longer refill it. Do you think this is something Dr. [name] can take over? And if so, can you send it to [facility]?  Let me know! Thanks, Maddie

## 2021-12-12 ENCOUNTER — Ambulatory Visit: Payer: Medicare Other | Admitting: Pharmacist

## 2021-12-12 DIAGNOSIS — I480 Paroxysmal atrial fibrillation: Secondary | ICD-10-CM | POA: Diagnosis not present

## 2021-12-12 DIAGNOSIS — Z5181 Encounter for therapeutic drug level monitoring: Secondary | ICD-10-CM | POA: Diagnosis not present

## 2021-12-12 DIAGNOSIS — Z7901 Long term (current) use of anticoagulants: Secondary | ICD-10-CM | POA: Diagnosis not present

## 2021-12-12 LAB — POCT INR: INR: 2.7 (ref 2.0–3.0)

## 2021-12-12 NOTE — Patient Instructions (Signed)
INR at goal. Continue taking 5 mg daily. Recheck INR in 4 weeks.

## 2021-12-12 NOTE — Progress Notes (Signed)
Anticoagulation Management Jane [name] is a 76 y.o. female who reports to the clinic for monitoring of warfarin treatment.    Indication: atrial fibrillation CHA2DS2 Vasc Score 4 (Age >82, female, HTN hx), HAS-BLED 3 (Age>65, renal disease, hx of recurrent GI bleeding)  Duration: indefinite Supervising physician: Adrian Prows  Anticoagulation Clinic Visit History:  Patient does not report signs/symptoms of bleeding or thromboembolism.  Reports no changes in diet, medications, or lifestyle.   Pt with recent hospital admission on 09/25/21 - 10/02/21 for acute GI bleed and low Hgb. Denies any recurrence of GI bleeding concerns since. Continues to have concerns of GERD, epigastric pain and discomfort. GI f/u on 12/03/21. Pt was started on metoclopramide 5 mg QID PRN to help with symptom management. Pt stated that she used it 3x over the past week and reported that her symptoms improved with it, however, pt is resistant to using it more often in the meantime due to concerns of potential adverse reactions.  Pt seen and evaluated by Dr. Burt Knack for [facility] procedure referral. Chest CT results showed LAA anatomy conducive to Watchman FLX implantation. LAAO rescheduled to 02/06/22   Pt reports that she did have symptomatic Afib symptoms x1 over the weekend. Used her PRN diltiazem 60 mg that provided moderate relief. Symptoms of chest tightness, palpitations, and SOB noted during the episodes. No recurrence since. Pt to continue to use Diltiazem 60 mg PRN for Afib symptom management.  Pt with hx of labile BP. BP avg over the past 2 weeks of 149/81 HR: 72. Pt reports BP at dialysis center are significantly elevated prior to her dialysis cessions and gets soft during and post dialysis. Pt attributes the elevated BP prior to the dialysis sessions due to being aggravated and agitated by the dialysis center staff. Pt was previously using hydralazine 25 mg PRN if SBP >139mHg, but pt reports that she isnt able to  tolerate using is any longer due to it worsening her esophageal burning session. Pt reported that she only used it 2-3 x over the past month. Pt started taking carvedilol 12.5 mg PRN is SBP >150 mmHg since she is able to tolerate it a lot better and appears to provide sufficient improvement in her BP management when elevated. Pt also has PRN midodrine 10 mg BID - TID for hypotensive BP management, but pt denies any recent need for using it over the past month. Pt notes that she feels fatigued, lightheaded, dizzy, nauseous whenever her SBP <120s. SBP goal thus preferred to be ~120-150 mmHg. Denies any recurrence in her lightheadedness, dizziness, syncope concerns since. Pt to continue using her carvedilol 12.5 mg PRN if SBP >150 mmHg. Will continue remote BP monitoring.   Anticoagulation Episode Summary     Current INR goal:  2.0-3.0  TTR:  61.2 % (3.1 y)  Next INR check:  01/09/2022  INR from last check:  2.7 (12/12/2021)  Weekly max warfarin dose:    Target end date:  Indefinite  INR check location:    Preferred lab:    Send INR reminders to:     Indications   Paroxysmal atrial fibrillation (HCC) [I48.0] Monitoring for long-term anticoagulant use [Z51.81 Z79.01]        Comments:           Allergies  Allergen Reactions   Latex Rash   Penicillins Other (See Comments)    Yeast infection / Childhood   Sulfa Antibiotics Rash   Tape Other (See Comments)    Plastic, silicone,  and paper tape causes bruising and pulls off skin. Cloth tape works fine    Current Outpatient Medications:    carvedilol (COREG) 25 MG tablet, Take 25 mg by mouth daily as needed. If SBP >150 mmHg, Disp: , Rfl:    Cinacalcet HCl (SENSIPAR PO), Take by mouth. Post dialysis three times weekly, Disp: , Rfl:    diltiazem (CARDIZEM) 60 MG tablet, Take 1 tablet (60 mg total) by mouth 3 (three) times daily as needed (Atrial fibrillation)., Disp: 30 tablet, Rfl: 1   atorvastatin (LIPITOR) 10 MG tablet, TAKE ONE TABLET  BY MOUTH ONCE DAILY, Disp: 30 tablet, Rfl: 3   B Complex-C-Zn-Folic Acid (DIALYVITE 443 WITH ZINC) 0.8 MG TABS, Take 1 tablet by mouth in the morning., Disp: , Rfl:    CALCITRIOL PO, Take 2 tablets by mouth every Monday, Wednesday, and Friday with hemodialysis., Disp: , Rfl:    ethyl chloride spray, Apply 1 application topically every Monday, Wednesday, and Friday with hemodialysis., Disp: , Rfl: 12   famotidine (PEPCID) 20 MG tablet, Take 1 tablet (20 mg total) by mouth at bedtime., Disp: 30 tablet, Rfl: 2   linaclotide (LINZESS) 145 MCG CAPS capsule, TAKE 1 CAPSULE BY MOUTH ONCE DAILY BEFORE BREAKFAST (Patient taking differently: Take 145 mcg by mouth daily before breakfast.), Disp: 90 capsule, Rfl: 2   Methoxy PEG-Epoetin Beta (MIRCERA IJ), Inject 1 Dose into the vein every Monday, Wednesday, and Friday., Disp: , Rfl:    metoCLOPramide (REGLAN) 5 MG tablet, Take 1 tablet (5 mg total) by mouth 4 (four) times daily -  before meals and at bedtime. As needed, Disp: 120 tablet, Rfl: 2   midodrine (PROAMATINE) 10 MG tablet, Take 1 tablet (10 mg total) by mouth 3 (three) times daily as needed (low BP and dizziness and lightheadedness symptoms.). (Patient taking differently: Take 10 mg by mouth 3 (three) times daily as needed (low BP and dizziness and lightheadedness symptoms. SBP <130 mmHg).), Disp: 90 tablet, Rfl: 3   pantoprazole (PROTONIX) 40 MG tablet, Take 1 tablet (40 mg total) by mouth 2 (two) times daily., Disp: 180 tablet, Rfl: 2   sevelamer carbonate (RENVELA) 800 MG tablet, Take 800 mg by mouth in the morning and at bedtime. With meals, Disp: , Rfl:    warfarin (COUMADIN) 5 MG tablet, Take 1 tablet (5 mg total) by mouth daily. Or as directed by coumadin clinic, Disp: 100 tablet, Rfl: 1 Past Medical History:  Diagnosis Date   Acid reflux    Anemia of chronic disease    Arthritis    Asthma    Atrial fibrillation ([place])    AVM (arteriovenous malformation)    Bilateral carotid bruits     Complication of anesthesia    "hard to wake up, I have sleep apnea" no CPAP   Diverticulitis    Duodenal ulcer    Dysrhythmia    Afib   ESRD (end stage renal disease) ([place])    MWF [place]   Gallbladder sludge    GI bleed    Headache    Heart murmur    Hemorrhoids    History of blood transfusion    Hypertension    Malaise and fatigue    Orthostatic hypotension    PAF (paroxysmal atrial fibrillation) (HCC)    Shortness of breath    " when I walk to fast"   Sleep apnea    Syncope    Tubulovillous adenoma of colon    ASSESSMENT  Recent Results: The most  recent result is correlated with 35 mg per week:  Lab Results  Component Value Date   INR 2.7 12/12/2021   INR 2.8 11/21/2021   INR 2.4 10/31/2021    Anticoagulation Dosing: Description   INR at goal. Continue taking 5 mg daily. Recheck INR in 4 weeks.       INR today: Therapeutic. INR continues to remain therapeutic on current weekly dose of 35 mg/week. Denies any further complains of hematochezia, melena, hematemesis. Pt with high bleeding risk in setting of recurrent significant GI bleeding concerns. Being worked up for possible watchman procedure in the near future. Procedure scheduled for 02/06/22  Denies any other relevant changes in diet, medications, or lifestyle. Denies any other relevant complains of bleeding or bruising symptoms. Denies any TIA/stroke like symptoms. Will continue current weekly dose and continue close monitoring and follow up    PLAN Weekly dose was unchanged by 0% to 35 mg/week. Continue taking 5 mg daily. Recheck INR in 3 week.   Patient Instructions  INR at goal. Continue taking 5 mg daily. Recheck INR in 4 weeks.  Patient advised to contact clinic or seek medical attention if signs/symptoms of bleeding or thromboembolism occur.  Patient verbalized understanding by repeating back information and was advised to contact me if further medication-related questions arise.    Follow-up No follow-ups on file.  [name], PharmD  15 minutes spent face-to-face with the patient during the encounter. 50% of time spent on education, including signs/sx bleeding and clotting, as well as food and drug interactions with warfarin. 50% of time was spent on fingerprick POC INR sample collection,processing, results determination, and documentation

## 2021-12-13 ENCOUNTER — Telehealth: Payer: Self-pay | Admitting: Pharmacist

## 2021-12-13 DIAGNOSIS — N2581 Secondary hyperparathyroidism of renal origin: Secondary | ICD-10-CM | POA: Diagnosis not present

## 2021-12-13 DIAGNOSIS — N186 End stage renal disease: Secondary | ICD-10-CM | POA: Diagnosis not present

## 2021-12-13 DIAGNOSIS — Z992 Dependence on renal dialysis: Secondary | ICD-10-CM | POA: Diagnosis not present

## 2021-12-13 NOTE — Chronic Care Management (AMB) (Cosign Needed Addendum)
    Chronic Care Management Pharmacy Assistant   Name: Jane [name]  MRN: [id number] DOB: 1946-03-02  12/17/2021 APPOINTMENT REMINDER   Called Jane [name], No answer, left message of appointment on 12/17/2021 at 9:00 via telephone visit with Jane [name], Pharm D. Notified to have all medications, supplements, blood pressure and/or blood sugar logs available during appointment and to return call if need to reschedule.  Patient called back and rescheduled appointment to 01/07/2022.  Care Gaps: AWV - scheduled for 08/12/2022 Last BP - 142/70 on 12/03/2021 Covid booster - overdue Shingrix - postponed  Star Rating Drug: Atorvastatin 10 mg - last filled 12/09/2021 90 DS at [facility]   Any gaps in medications fill history? No  Jane [name] [name]  Catering manager [phone]

## 2021-12-16 DIAGNOSIS — N2581 Secondary hyperparathyroidism of renal origin: Secondary | ICD-10-CM | POA: Diagnosis not present

## 2021-12-16 DIAGNOSIS — N186 End stage renal disease: Secondary | ICD-10-CM | POA: Diagnosis not present

## 2021-12-16 DIAGNOSIS — Z992 Dependence on renal dialysis: Secondary | ICD-10-CM | POA: Diagnosis not present

## 2021-12-17 ENCOUNTER — Telehealth: Payer: Medicare Other

## 2021-12-18 DIAGNOSIS — N186 End stage renal disease: Secondary | ICD-10-CM | POA: Diagnosis not present

## 2021-12-18 DIAGNOSIS — Z992 Dependence on renal dialysis: Secondary | ICD-10-CM | POA: Diagnosis not present

## 2021-12-18 DIAGNOSIS — N2581 Secondary hyperparathyroidism of renal origin: Secondary | ICD-10-CM | POA: Diagnosis not present

## 2021-12-19 DIAGNOSIS — N186 End stage renal disease: Secondary | ICD-10-CM | POA: Diagnosis not present

## 2021-12-19 DIAGNOSIS — Z992 Dependence on renal dialysis: Secondary | ICD-10-CM | POA: Diagnosis not present

## 2021-12-19 DIAGNOSIS — I158 Other secondary hypertension: Secondary | ICD-10-CM | POA: Diagnosis not present

## 2021-12-20 DIAGNOSIS — N2581 Secondary hyperparathyroidism of renal origin: Secondary | ICD-10-CM | POA: Diagnosis not present

## 2021-12-20 DIAGNOSIS — Z992 Dependence on renal dialysis: Secondary | ICD-10-CM | POA: Diagnosis not present

## 2021-12-20 DIAGNOSIS — D631 Anemia in chronic kidney disease: Secondary | ICD-10-CM | POA: Diagnosis not present

## 2021-12-20 DIAGNOSIS — N186 End stage renal disease: Secondary | ICD-10-CM | POA: Diagnosis not present

## 2021-12-23 DIAGNOSIS — D631 Anemia in chronic kidney disease: Secondary | ICD-10-CM | POA: Diagnosis not present

## 2021-12-23 DIAGNOSIS — N186 End stage renal disease: Secondary | ICD-10-CM | POA: Diagnosis not present

## 2021-12-23 DIAGNOSIS — Z992 Dependence on renal dialysis: Secondary | ICD-10-CM | POA: Diagnosis not present

## 2021-12-23 DIAGNOSIS — N2581 Secondary hyperparathyroidism of renal origin: Secondary | ICD-10-CM | POA: Diagnosis not present

## 2021-12-25 DIAGNOSIS — N186 End stage renal disease: Secondary | ICD-10-CM | POA: Diagnosis not present

## 2021-12-25 DIAGNOSIS — I10 Essential (primary) hypertension: Secondary | ICD-10-CM | POA: Diagnosis not present

## 2021-12-25 DIAGNOSIS — Z992 Dependence on renal dialysis: Secondary | ICD-10-CM | POA: Diagnosis not present

## 2021-12-25 DIAGNOSIS — N2581 Secondary hyperparathyroidism of renal origin: Secondary | ICD-10-CM | POA: Diagnosis not present

## 2021-12-25 DIAGNOSIS — D631 Anemia in chronic kidney disease: Secondary | ICD-10-CM | POA: Diagnosis not present

## 2021-12-27 DIAGNOSIS — D631 Anemia in chronic kidney disease: Secondary | ICD-10-CM | POA: Diagnosis not present

## 2021-12-27 DIAGNOSIS — N186 End stage renal disease: Secondary | ICD-10-CM | POA: Diagnosis not present

## 2021-12-27 DIAGNOSIS — N2581 Secondary hyperparathyroidism of renal origin: Secondary | ICD-10-CM | POA: Diagnosis not present

## 2021-12-27 DIAGNOSIS — Z992 Dependence on renal dialysis: Secondary | ICD-10-CM | POA: Diagnosis not present

## 2021-12-30 DIAGNOSIS — N2581 Secondary hyperparathyroidism of renal origin: Secondary | ICD-10-CM | POA: Diagnosis not present

## 2021-12-30 DIAGNOSIS — D631 Anemia in chronic kidney disease: Secondary | ICD-10-CM | POA: Diagnosis not present

## 2021-12-30 DIAGNOSIS — N186 End stage renal disease: Secondary | ICD-10-CM | POA: Diagnosis not present

## 2021-12-30 DIAGNOSIS — Z992 Dependence on renal dialysis: Secondary | ICD-10-CM | POA: Diagnosis not present

## 2022-01-01 DIAGNOSIS — N186 End stage renal disease: Secondary | ICD-10-CM | POA: Diagnosis not present

## 2022-01-01 DIAGNOSIS — D631 Anemia in chronic kidney disease: Secondary | ICD-10-CM | POA: Diagnosis not present

## 2022-01-01 DIAGNOSIS — I4891 Unspecified atrial fibrillation: Secondary | ICD-10-CM | POA: Diagnosis not present

## 2022-01-01 DIAGNOSIS — N2581 Secondary hyperparathyroidism of renal origin: Secondary | ICD-10-CM | POA: Diagnosis not present

## 2022-01-01 DIAGNOSIS — Z992 Dependence on renal dialysis: Secondary | ICD-10-CM | POA: Diagnosis not present

## 2022-01-02 ENCOUNTER — Encounter: Payer: Self-pay | Admitting: Physical Medicine & Rehabilitation

## 2022-01-02 ENCOUNTER — Encounter: Payer: Medicare Other | Attending: Physical Medicine & Rehabilitation | Admitting: Physical Medicine & Rehabilitation

## 2022-01-02 ENCOUNTER — Telehealth: Payer: Self-pay | Admitting: *Deleted

## 2022-01-02 VITALS — BP 151/69 | HR 57 | Temp 98.1°F | Ht 65.0 in | Wt 194.0 lb

## 2022-01-02 DIAGNOSIS — M47816 Spondylosis without myelopathy or radiculopathy, lumbar region: Secondary | ICD-10-CM | POA: Diagnosis not present

## 2022-01-02 MED ORDER — DIAZEPAM 5 MG PO TABS: ORAL_TABLET | ORAL | 0 refills | Status: DC

## 2022-01-02 NOTE — Telephone Encounter (Signed)
Diazepam 5 mg called to [facility] pharmacy per protocol for pre med for back injection. Must have a driver.

## 2022-01-02 NOTE — Progress Notes (Signed)
  PROCEDURE RECORD [facility]   Name: Jane [name] DOB:1945-09-25 MRN: [id number]  Date:01/02/2022  Physician: [name], MD    Nurse/CMA: Jorja Loa MA  Allergies:  Allergies  Allergen Reactions   Latex Rash   Penicillins Other (See Comments)    Yeast infection / Childhood   Sulfa Antibiotics Rash   Tape Other (See Comments)    Plastic, silicone, and paper tape causes bruising and pulls off skin. Cloth tape works fine    Consent Signed: Yes.    Is patient diabetic? No.  CBG today? N/A  Pregnant: No. LMP: No LMP recorded. Patient is postmenopausal. (age 13-55)  Anticoagulants: yes (Warfarin last taken yesterday 5 mg) Anti-inflammatory: no Antibiotics: no  Procedure: Left T12, L1, L2 Radiofrequency Position: Prone Start Time: 10:25 am  End Time: 10:40 am  Fluoro Time: 37  RN/CMA Tammie Ellsworth MA Dohn Stclair MA    Time 10:06 AM 10:46 AM    BP 151/69 163/72    Pulse 57 61    Respirations 16 16    O2 Sat 97 98    S/S 6 6    Pain Level 9/10 0/10     D/C home with daughter, patient A & O X 3, D/C instructions reviewed, and sits independently.

## 2022-01-02 NOTE — Patient Instructions (Signed)
You had a radio frequency procedure today This was done to alleviate joint pain in your lumbar area We injected lidocaine which is a local anesthetic.  You may experience soreness at the injection sites. You may also experienced some irritation of the nerves that were heated I'm recommending ice for 30 minutes every 2 hours as needed for the next 24-48 hours   

## 2022-01-02 NOTE — Progress Notes (Signed)
Left Lumbar L3, L4  medial branch blocks and L 5 dorsal ramus injection under fluoroscopic guidance   Indication: Left Lumbar pain which is not relieved by medication management or other conservative care and interfering with self-care and mobility. (Originally scheduled for right but states Left side has been more painful the last several weeks  Informed consent was obtained after describing risks and benefits of the procedure with the patient, this includes bleeding, bruising, infection, paralysis and medication side effects.  The patient wishes to proceed and has given written consent.  The patient was placed in a prone position.  The lumbar area was marked and prepped with Betadine.  One mL of 1% lidocaine was injected into each of 3 areas into the skin and subcutaneous tissue.  Then a 22-gauge 10cm spinal needle was inserted targeting the junction of the left S1 superior articular process and sacral ala junction.  Needle was advanced under fluoroscopic guidance.  Bone contact was made. Isovue 200 was injected x 0.5 mL demonstrating no intravascular uptake.  Then a solution containing  2% MPF lidocaine was injected x 0.5 mL.  Then the left L5 superior articular process in transverse process junction was targeted.  Bone contact was made. Isovue 200 was injected x 0.5 mL demonstrating no intravascular uptake.  Then a solution containing 2% MPF lidocaine was injected x 0.5 mL.  Then the left L4 superior articular process in transverse process junction was targeted.  Bone contact was made.  Isovue 200 was injected x 0.5 mL demonstrating no intravascular uptake.  Then a solution containing  2% MPF lidocaine was injected x 0.5 mL.  Patient tolerated procedure well.  Post procedure instructions were given.

## 2022-01-03 DIAGNOSIS — N186 End stage renal disease: Secondary | ICD-10-CM | POA: Diagnosis not present

## 2022-01-03 DIAGNOSIS — N2581 Secondary hyperparathyroidism of renal origin: Secondary | ICD-10-CM | POA: Diagnosis not present

## 2022-01-03 DIAGNOSIS — Z992 Dependence on renal dialysis: Secondary | ICD-10-CM | POA: Diagnosis not present

## 2022-01-03 DIAGNOSIS — D631 Anemia in chronic kidney disease: Secondary | ICD-10-CM | POA: Diagnosis not present

## 2022-01-06 ENCOUNTER — Ambulatory Visit: Payer: Medicare Other

## 2022-01-06 ENCOUNTER — Telehealth: Payer: Self-pay | Admitting: Pharmacist

## 2022-01-06 DIAGNOSIS — N186 End stage renal disease: Secondary | ICD-10-CM | POA: Diagnosis not present

## 2022-01-06 DIAGNOSIS — D631 Anemia in chronic kidney disease: Secondary | ICD-10-CM | POA: Diagnosis not present

## 2022-01-06 DIAGNOSIS — Z992 Dependence on renal dialysis: Secondary | ICD-10-CM | POA: Diagnosis not present

## 2022-01-06 DIAGNOSIS — N2581 Secondary hyperparathyroidism of renal origin: Secondary | ICD-10-CM | POA: Diagnosis not present

## 2022-01-06 NOTE — Chronic Care Management (AMB) (Signed)
    Chronic Care Management Pharmacy Assistant   Name: SHARALEE WITMAN  MRN: [id number] DOB: 12/11/1945  01/07/2022 APPOINTMENT REMINDER  Called [facility], No answer, left message of appointment on 01/07/2022 at 4:00 via telephone visit with Jeni Salles, Pharm D. Notified to have all medications, supplements, blood pressure and/or blood sugar logs available during appointment and to return call if need to reschedule.  Care Gaps: AWV - scheduled 08/12/2022 Last BP - 151/69 on 01/02/2022 Covid booster overdue Shingrix - postponed  Star Rating Drug: Atorvastatin 10 mg - last filled 12/09/2021 90 DS at [facility]  Any gaps in medications fill history? No   Gennie Alma [facility]  Catering manager [phone]

## 2022-01-07 ENCOUNTER — Ambulatory Visit (INDEPENDENT_AMBULATORY_CARE_PROVIDER_SITE_OTHER): Payer: Medicare Other | Admitting: Pharmacist

## 2022-01-07 DIAGNOSIS — K219 Gastro-esophageal reflux disease without esophagitis: Secondary | ICD-10-CM

## 2022-01-07 DIAGNOSIS — L02612 Cutaneous abscess of left foot: Secondary | ICD-10-CM | POA: Diagnosis not present

## 2022-01-07 DIAGNOSIS — L03032 Cellulitis of left toe: Secondary | ICD-10-CM | POA: Diagnosis not present

## 2022-01-07 DIAGNOSIS — L03031 Cellulitis of right toe: Secondary | ICD-10-CM | POA: Diagnosis not present

## 2022-01-07 DIAGNOSIS — L603 Nail dystrophy: Secondary | ICD-10-CM | POA: Diagnosis not present

## 2022-01-07 DIAGNOSIS — L600 Ingrowing nail: Secondary | ICD-10-CM | POA: Diagnosis not present

## 2022-01-07 DIAGNOSIS — I129 Hypertensive chronic kidney disease with stage 1 through stage 4 chronic kidney disease, or unspecified chronic kidney disease: Secondary | ICD-10-CM

## 2022-01-07 NOTE — Progress Notes (Signed)
Chronic Care Management Pharmacy Note  01/09/2022 Name:  Jane [name] MRN:  [id number] DOB:  02-20-1946  Summary: BP is fluctuating significantly Pt is following up with cardiology this week  Recommendations/Changes made from today's visit: -Recommended discussing use of smaller dose for carvedilol with cardiologist to avoid significant BP fluctuations -Recommended continued BP monitoring at home  Plan: BP assessment in 2 months Follow up in 6 months  Subjective: Jane [name] is an 76 y.o. year old female who is a primary patient of [name], [name], MD.  The CCM team was consulted for assistance with disease management and care coordination needs.    Engaged with patient by telephone for follow up visit in response to provider referral for pharmacy case management and/or care coordination services.   Consent to Services:  The patient was given information about Chronic Care Management services, agreed to services, and gave verbal consent prior to initiation of services.  Please see initial visit note for detailed documentation.   Patient Care Team: [name], [name], MD as PCP - General (Family Medicine) Sidonie Dickens, MD as Referring Physician (Psychiatry) [facility], [place], [place], [place] as Pharmacist (Pharmacist) Rebekah Chesterfield, LCSW as Social Worker (Licensed Clinical Social Worker)  Recent office visits: 11/12/2021 [name] MD - Patient was seen for Hypertension with renal disease and additional issues. Discontinued Hydralazine and Metoprolol. Follow up in 6 months.   09/24/2021 [name] MD - Patient was seen for Gastroesophageal reflux disease without esophagitis and additional issues. No medication changes. Return if symptoms worsen or fail to improve.  Recent consult visits: 01/02/22 [name] MD (Physical medicine and rehabilitation): Patient presented for spondylosis without myelopathy or rediculopathy, lumbar region and  radio frequency procedure. Follow up in 6 weeks.  12/12/21 [name], [facility] (cardiology): Patient presented for anti-coag visit. INR 2.7, goal 2-3. Continued warfarin 5 mg every day. Follow up in 4 weeks.  12/03/21 Zenovia Jarred, MD (gastro): Patient presented for constipation follow up. Prescribed metoclopramide 5 mg TID before meals and at bedtime as needed. Follow up in 6 weeks.  11/14/21 [name] MD (Physical medicine and rehabilitation): Patient presented for spondylosis without myelopathy or rediculopathy, lumbar region.  10/17/2021 Sherren Mocha MD (cardiology) - Patient was seen for paroxysmal atrial fibrillation. Started Metoprolol Tartrate 50 mg 1 tablet 2 hours prior to scan. No follow up noted.    09/24/2021 Adrian Prows MD(cardiology) - Patient was seen for paroxysmal atrial fibrillation and additional issues. No medication changes. Follow up in 2 weeks.    09/19/2021 Carl Best NP (GI) - Patient was seen for ESRD on dialysis and additional issues. Discontinued cyclobenzaprine. No follow up noted.   09/17/2021 [name] MD (Phys med and rehab) - Patient was seen for Spondylosis without myelopathy or radiculopathy, lumbar region. No medication changes. Follow up if symptoms worsen or fail to improve.    09/03/2021 Monica Martinez MD (vascular) - Patient was seen for ESRD on hemodialysis. No medication changes. No follow up noted.   08/07/21 Zenovia Jarred, MD (gastro): Patient presented for constipation follow up.  07/23/2021 [name] MD (Physical medicine and rehabilitation) - Patient was seen for spondylosis without myelopathy or rediculopathy, lumbar region. Discontinued Hydralazine. No follow up noted.   07/18/2021 Adrian Prows MD (cardiology) - Patient was seen for atrial fibrillation. Started Atorvastatin 10 mg daily. Discontinued Amiodarone, Carvedilol, Diltiazem, Duloxetine and Isosorbide. Follow up in 1 year.  Hospital visits: Admitted to [facility]  on 09/25/2021 due to Acute GI bleeding. Discharge date was 10/02/2021.   New?Medications Started at [facility] Discharge:?? famotidine (PEPCID) Medication Changes at Hospital Discharge: No medication changes Medications Discontinued at Hospital Discharge: warfarin 5 MG tablet (COUMADIN) Medications that remain the same after Hospital Discharge:??  -All other medications will remain the same.     Objective:  Lab Results  Component Value Date   CREATININE 9.64 (H) 10/02/2021   BUN 31 (H) 10/02/2021   GFR 2.96 (LL) 09/19/2021   GFRNONAA 4 (L) 10/02/2021   GFRAA 4 (L) 01/18/2020   NA 134 (L) 10/02/2021   K 3.6 10/02/2021   CALCIUM 9.0 10/02/2021   CO2 25 10/02/2021   GLUCOSE 107 (H) 10/02/2021    Lab Results  Component Value Date/Time   GFR 2.96 (LL) 09/19/2021 02:30 PM   GFR 4.82 (LL) 11/20/2020 10:25 AM    Last diabetic Eye exam: No results found for: "HMDIABEYEEXA"  Last diabetic Foot exam: No results found for: "HMDIABFOOTEX"   Lab Results  Component Value Date   CHOL 198 10/06/2019   HDL 53 10/06/2019   LDLCALC 135 (H) 10/06/2019   TRIG 51 10/06/2019   CHOLHDL 3.7 10/06/2019       Latest Ref Rng & Units 10/02/2021   11:13 AM 09/27/2021   12:11 PM 09/25/2021    3:34 PM  Hepatic Function  Total Protein 6.5 - 8.1 g/dL   7.5   Albumin 3.5 - 5.0 g/dL 3.0  3.3  3.6   AST 15 - 41 U/L   20   ALT 0 - 44 U/L   14   Alk Phosphatase 38 - 126 U/L   108   Total Bilirubin 0.3 - 1.2 mg/dL   0.4     Lab Results  Component Value Date/Time   TSH 1.36 11/20/2020 10:25 AM   TSH 0.351 01/13/2020 07:33 AM   TSH 1.129 10/06/2019 03:10 PM   TSH 0.94 09/22/2018 10:32 AM       Latest Ref Rng & Units 10/02/2021   11:13 AM 10/01/2021    5:26 AM 09/30/2021    6:11 PM  CBC  WBC 4.0 - 10.5 K/uL 6.2     Hemoglobin 12.0 - 15.0 g/dL 7.5  7.7  8.5   Hematocrit 36.0 - 46.0 % 23.4  23.8  25.4   Platelets 150 - 400 K/uL 137       No results found for:  "VD25OH"  Clinical ASCVD: Yes  The 10-year ASCVD risk score (Arnett DK, et al., 2019) is: 41.4%   Values used to calculate the score:     Age: 76 years     Sex: Female     Is Non-Hispanic African American: Yes     Diabetic: Yes     Tobacco smoker: No     Systolic Blood Pressure: 222 mmHg     Is BP treated: Yes     HDL Cholesterol: 53 mg/dL     Total Cholesterol: 198 mg/dL       01/02/2022    9:58 AM 11/14/2021   12:56 PM 11/12/2021   10:31 AM  Depression screen PHQ 2/9  Decreased Interest 0 0 0  Down, Depressed, Hopeless 0 0 0  PHQ - 2 Score 0 0 0  Altered sleeping   3  Tired, decreased energy   3  Change in appetite   0  Feeling bad or failure about yourself    0  Trouble concentrating   0  Moving slowly  or fidgety/restless   0  Suicidal thoughts   0  PHQ-9 Score   6  Difficult doing work/chores   Not difficult at all     CHA2DS2/VAS Stroke Risk Points  Current as of 4 days ago     4 >= 2 Points: High Risk  1 - 1.99 Points: Medium Risk  0 Points: Low Risk    No Change      Details    This score determines the patient's risk of having a stroke if the  patient has atrial fibrillation.       Points Metrics  0 Has Congestive Heart Failure:  No    Current as of 4 days ago  0 Has Vascular Disease:  No    Current as of 4 days ago  1 Has Hypertension:  Yes    Current as of 4 days ago  2 Age:  51    Current as of 4 days ago  0 Has Diabetes:  No    Current as of 4 days ago  0 Had Stroke:  No  Had TIA:  No  Had Thromboembolism:  No    Current as of 4 days ago  1 Female:  Yes    Current as of 4 days ago       Social History   Tobacco Use  Smoking Status Never   Passive exposure: Never  Smokeless Tobacco Never   BP Readings from Last 3 Encounters:  01/02/22 (!) 151/69  12/03/21 (!) 142/70  11/14/21 (!) 184/75   Pulse Readings from Last 3 Encounters:  01/02/22 (!) 57  12/03/21 (!) 52  11/14/21 86   Wt Readings from Last 3 Encounters:  01/02/22 194 lb  (88 kg)  12/03/21 189 lb (85.7 kg)  11/14/21 189 lb 12.8 oz (86.1 kg)   BMI Readings from Last 3 Encounters:  01/02/22 32.28 kg/m  12/03/21 31.45 kg/m  11/14/21 31.58 kg/m    Assessment/Interventions: Review of patient past medical history, allergies, medications, health status, including review of consultants reports, laboratory and other test data, was performed as part of comprehensive evaluation and provision of chronic care management services.   SDOH:  (Social Determinants of Health) assessments and interventions performed: Yes   SDOH Screenings   Alcohol Screen: Not on file  Depression (PHQ2-9): Low Risk  (01/02/2022)   Depression (PHQ2-9)    PHQ-2 Score: 0  Recent Concern: Depression (PHQ2-9) - Medium Risk (11/12/2021)   Depression (PHQ2-9)    PHQ-2 Score: 6  Financial Resource Strain: High Risk (08/21/2021)   Overall Financial Resource Strain (CARDIA)    Difficulty of Paying Living Expenses: Very hard  Food Insecurity: Food Insecurity Present (08/21/2021)   Hunger Vital Sign    Worried About [employer] in the Last Year: Often true    Ran Out of Food in the Last Year: Often true  Housing: Low Risk  (07/31/2020)   Housing    Last Housing Risk Score: 0  Physical Activity: Inactive (08/06/2021)   Exercise Vital Sign    Days of Exercise per Week: 0 days    Minutes of Exercise per Session: 0 min  Social Connections: Moderately Isolated (07/31/2020)   Social Connection and Isolation Panel [NHANES]    Frequency of Communication with Friends and Family: More than three times a week    Frequency of Social Gatherings with Friends and Family: Never    Attends Religious Services: More than 4 times per year    Active Member  of Clubs or Organizations: No    Attends Archivist Meetings: Never    Marital Status: Divorced  Stress: Stress Concern Present (08/21/2021)   [employer] [place]    Feeling of Stress :  Very much  Tobacco Use: Low Risk  (01/02/2022)   Patient History    Smoking Tobacco Use: Never    Smokeless Tobacco Use: Never    Passive Exposure: Never  Transportation Needs: Unmet Transportation Needs (08/21/2021)   PRAPARE - Transportation    Lack of Transportation (Medical): Yes    Lack of Transportation (Non-Medical): No    CCM Care Plan  Allergies  Allergen Reactions   Latex Rash   Penicillins Other (See Comments)    Yeast infection / Childhood   Sulfa Antibiotics Rash   Tape Other (See Comments)    Plastic, silicone, and paper tape causes bruising and pulls off skin. Cloth tape works fine    Medications Reviewed Today     Reviewed by Viona Gilmore, [facility] (Pharmacist) on 01/09/22 at [phone]  Med List Status: <None>   Medication Order Taking? Sig Documenting Provider Last Dose Status Informant  atorvastatin (LIPITOR) 10 MG tablet [id number] No TAKE ONE TABLET BY MOUTH ONCE DAILY Adrian Prows, MD Taking Active   B Complex-C-Zn-Folic Acid (DIALYVITE 025 WITH ZINC) 0.8 MG TABS [id number] No Take 1 tablet by mouth in the morning. [provider] Taking Active Self           Med Note Lenor Derrick Oct 15, 2020  1:55 PM)    CALCITRIOL PO [id number] No Take 2 tablets by mouth every Monday, Wednesday, and Friday with hemodialysis. [provider] Taking Active Self  carvedilol (COREG) 25 MG tablet [id number] No Take 12.5 mg by mouth daily as needed. If SBP >150 mmHg [provider] Taking Active   Cinacalcet HCl (SENSIPAR PO) [id number] No Take by mouth. Post dialysis three times weekly [provider] Taking Active   diazepam (VALIUM) 5 MG tablet [id number]  Take one tablet po 30-60 min prior to procedure. Must have a driver. Kirsteins, Luanna Salk, MD  Active   diltiazem (CARDIZEM) 60 MG tablet [id number] No Take 1 tablet (60 mg total) by mouth 3 (three) times daily as needed (Atrial fibrillation). Adrian Prows, MD Taking Active   ethyl chloride  spray [id number] No Apply 1 application topically every Monday, Wednesday, and Friday with hemodialysis. [provider] Taking Active Self  famotidine (PEPCID) 20 MG tablet [id number] No Take 1 tablet (20 mg total) by mouth at bedtime. [name], [name], MD Taking Active   linaclotide Rolan Lipa) 145 MCG CAPS capsule [id number] No TAKE 1 CAPSULE BY MOUTH ONCE DAILY BEFORE BREAKFAST  Patient taking differently: Take 145 mcg by mouth daily before breakfast.   Pyrtle, Lajuan Lines, MD Taking Active Self  Methoxy PEG-Epoetin Beta [facility] IJ) [id number] No Inject 1 Dose into the vein every Monday, Wednesday, and Friday. [provider] Taking Active Self  metoCLOPramide (REGLAN) 5 MG tablet [id number] No Take 1 tablet (5 mg total) by mouth 4 (four) times daily -  before meals and at bedtime. As needed Pyrtle, Lajuan Lines, MD Taking Active   midodrine (PROAMATINE) 10 MG tablet [id number] No Take 1 tablet (10 mg total) by mouth 3 (three) times daily as needed (low BP and dizziness and lightheadedness symptoms.).  Patient taking differently: Take 10 mg by mouth 3 (three) times daily as needed (low BP and dizziness and  lightheadedness symptoms. SBP <130 mmHg).   Adrian Prows, MD Taking Active Self  pantoprazole (PROTONIX) 40 MG tablet [id number] No Take 1 tablet (40 mg total) by mouth 2 (two) times daily. Jerene Bears, MD Taking Active Self  sevelamer carbonate (RENVELA) 800 MG tablet [id number] No Take 800 mg by mouth in the morning and at bedtime. With meals [provider] Taking Active Self  warfarin (COUMADIN) 5 MG tablet [id number] No Take 1 tablet (5 mg total) by mouth daily. Or as directed by coumadin clinic Adrian Prows, MD Taking Active   Med List Note Gerlene Burdock 06/25/16 2051): Dialysis days: Mon/Wed/Fri at: [place], [place],  [id number]  [phone]            Patient Active Problem List   Diagnosis Date Noted   Polyp  of transverse colon    AVM (arteriovenous malformation) of colon, acquired with hemorrhage    Symptomatic anemia 09/26/2021   Gastritis and gastroduodenitis    Hematochezia    OSA (obstructive sleep apnea) 07/19/2021   Atherosclerosis of aorta ([place]) 05/13/2021   Unintentional weight loss 05/08/2021   Dysphagia 05/08/2021   Chronic left sacroiliac pain 05/07/2021   GI bleed 01/31/2021   Gastric intestinal metaplasia 10/18/2020   Dyspepsia 10/18/2020   Chronic constipation 10/18/2020   Nausea without vomiting 05/29/2020   Generalized osteoarthritis of multiple sites 03/27/2020   Chronic pain disorder 03/27/2020   Acute GI bleeding 01/13/2020   Duodenal ulcer with hemorrhage    Lower GI bleed 01/12/2020   Monitoring for long-term anticoagulant use 01/03/2020   Intestinal metaplasia of gastric mucosa 12/04/2019   Gastric polyp 12/04/2019   Tubulovillous adenoma of small intestine 12/03/2019   Adenomatous duodenal polyp 12/03/2019   Abnormal endoscopy of upper gastrointestinal tract 12/03/2019   Depression, major, recurrent, moderate ([place]) 09/22/2018   Orthostatic hypotension 09/16/2018   Bilateral lower extremity edema 08/24/2018   Abdominal pain, diffuse 05/11/2018   Unilateral primary osteoarthritis, right knee 03/18/2018   Chronic pain of right knee 03/18/2018   Abnormal facial hair 11/10/2017   Chronic anticoagulation 11/10/2017   GERD (gastroesophageal reflux disease) 06/16/2017   Constipation 10/16/2016   Diverticulitis of large intestine with perforation 06/26/2016   Anemia in other chronic diseases classified elsewhere 06/25/2016   Chest pain with moderate risk for cardiac etiology 06/25/2016   ESRD on hemodialysis ([place]) 06/30/2012   Paroxysmal atrial fibrillation ([place]) 06/30/2012   Hypertension with renal disease 06/30/2012    Immunization History  Administered Date(s) Administered   Influenza, High Dose Seasonal PF 04/08/2019, 05/07/2020, 04/26/2021    Influenza,inj,quad, With Preservative 04/12/2018   Influenza-Unspecified 04/20/2016, 05/21/2018   PFIZER(Purple Top)SARS-COV-2 Vaccination 09/03/2019, 09/25/2019, 05/26/2020   Pneumococcal Conjugate-13 07/20/2015   Pneumococcal Polysaccharide-23 10/12/2012   Tdap 06/20/2015   Patient reports she has not been feeling well because of her lower blood pressure readings. Patient reports her blood pressure was way down and she couldn't even get out of bed the other day.  Patient reports she has gained some weight and gained about 8 lbs this last time she went to the doctor. She gained about 8 lbs in a month. Patient can eat again and is encouraged by this. Patient denies swelling or shortness of breath and has not taken BP medications today.   Patient had ingrown toenails removed today which was painful and thinks that might be why her BP was up some. . Patient feels tired and tightness in  her chest when she has Afib. Patient does drink the coffee sometimes to settle her stomach.  Conditions to be addressed/monitored:  Hypertension, Hyperlipidemia, Atrial Fibrillation, GERD, and Constipation  Conditions addressed this visit: Hypertension, Afib   Care Plan : [place]  Updates made by Viona Gilmore, [place] since 01/09/2022 12:00 AM     Problem: Problem: Hypotension, Hyperlipidemia, Atrial Fibrillation, GERD, and Constipation      Long-Range Goal: Patient-Specific Goal   Start Date: 08/13/2021  Expected End Date: 08/14/2021  Recent Progress: On track  Priority: High  Note:   Current Barriers:  Unable to independently afford treatment regimen Unable to maintain control of low blood pressure  Pharmacist Clinical Goal(s):  Patient will achieve adherence to monitoring guidelines and medication adherence to achieve therapeutic efficacy maintain control of blood pressure as evidenced by home blood pressure readings  through collaboration with PharmD and provider.    Interventions: 1:1 collaboration with [name], [name], MD regarding development and update of comprehensive plan of care as evidenced by provider attestation and co-signature Inter-disciplinary care team collaboration (see longitudinal plan of care) Comprehensive medication review performed; medication list updated in electronic medical record  Hypotension/Hypertension (BP goal > 110/60) -Not ideally controlled -Current treatment: Midodrine 10 mg 1 tablet three times daily as needed - Appropriate, Query effective, Safe, Accessible Carvedilol 25 mg 1/2 tablet as needed - Appropriate, Query effective, Query Safe, Accessible -Medications previously tried: none  -Current home readings: 178/69 (when sitting down),  -Current dietary habits: not eating any salt -Current exercise habits: not able to exercise -Reports hypotensive/hypertensive symptoms -Educated on Importance of home blood pressure monitoring; Proper BP monitoring technique; Symptoms of hypotension and importance of maintaining adequate hydration; -Counseled to monitor BP at home daily, document, and provide log at future appointments -Recommended to continue current medication  Hyperlipidemia: (LDL goal < 100) -Uncontrolled -Current treatment: Atorvastatin 10 mg 1 tablet daily - Appropriate, Query effective, Safe, Accessible   -Medications previously tried: none  -Current dietary patterns: cannot eat red meat, chicken or fish (makes her sick); cannot do many fruits and vegetables  -Current exercise habits: unable to exercise -Educated on Cholesterol goals;  Benefits of statin for ASCVD risk reduction; Importance of limiting foods high in cholesterol; -Counseled on diet and exercise extensively Recommended to continue current medication Recommended repeat lipid panel.  Atrial Fibrillation (Goal: prevent stroke and major bleeding) -Controlled -CHADSVASC: 4 -Current treatment: Rate control: none Anticoagulation:  warfarin 5 mg as directed by coumadin clinic -Medications previously tried: amiodarone -Home BP and HR readings: BP readings go directly to cardiology -Counseled on increased risk of stroke due to Afib and benefits of anticoagulation for stroke prevention; bleeding risk associated with warfarin and importance of self-monitoring for signs/symptoms of bleeding; avoidance of NSAIDs due to increased bleeding risk with anticoagulants; -Recommended to continue current medication  GERD (Goal: minimize symptoms) -Controlled -Current treatment  Pantoprazole 40 mg 1 tablet twice daily - taking as needed - Appropriate, Effective, Safe, Accessible -Medications previously tried: none  -Counseled on non-pharmacologic management of symptoms such as elevating the head of your bed, avoiding eating 2-3 hours before bed, avoiding triggering foods such as acidic, spicy, or fatty foods, eating smaller meals, and wearing clothes that are loose around the waist Recommended as needed use of pantoprazole.  Constipation (Goal: minimize symptoms) -Controlled -Current treatment  Linzess 145 mcg 1 capsule once daily - Appropriate, Effective, Safe, Accessible -Medications previously tried: stool softeners  -Recommended to continue current medication  Health Maintenance -Vaccine gaps: shingrix, COVID booster -Current therapy:  Calcitriol three times weekly (takes at dialysis) Ethyl chloride spray (on dialysis days) Dialyvite 0.8 mg 1 tablet up to three times daily (taking every time she eats; gets it through dialysis pharmacy) -Educated on Cost vs benefit of each product must be carefully weighed by individual consumer -Patient is satisfied with current therapy and denies issues -Recommended to continue current medication  Patient Goals/Self-Care Activities Patient will:  - take medications as prescribed as evidenced by patient report and record review check blood pressure daily, document, and provide at  future appointments  Follow Up Plan: Telephone follow up appointment with care management team member scheduled for: 6 months        Medication Assistance: None required.  Patient affirms current coverage meets needs.  Compliance/Adherence/Medication fill history: Care Gaps: Shingrix, COVID booster Last BP - 151/69 on 01/02/2022  Star-[facility] Drugs: Atorvastatin 10 mg - last filled 12/09/2021 90 DS at [facility]  Patient's preferred pharmacy is:  [facility] - [place], [place] - [address] [place] [address] Phone: [phone] Fax: [phone]  Uses pill box? No - not taking many medications scheduled Pt endorses 95% compliance  We discussed: Benefits of medication synchronization, packaging and delivery as well as enhanced pharmacist oversight with [facility]. Patient decided to: Utilize [facility] pharmacy for medication synchronization, packaging and delivery  Care Plan and Follow Up Patient Decision:  Patient agrees to Care Plan and Follow-up.  Plan: Telephone follow up appointment with care management team member scheduled for:  6 months  Jeni Salles, PharmD, [place] [facility] [place] [phone]

## 2022-01-08 DIAGNOSIS — N2581 Secondary hyperparathyroidism of renal origin: Secondary | ICD-10-CM | POA: Diagnosis not present

## 2022-01-08 DIAGNOSIS — Z992 Dependence on renal dialysis: Secondary | ICD-10-CM | POA: Diagnosis not present

## 2022-01-08 DIAGNOSIS — D631 Anemia in chronic kidney disease: Secondary | ICD-10-CM | POA: Diagnosis not present

## 2022-01-08 DIAGNOSIS — N186 End stage renal disease: Secondary | ICD-10-CM | POA: Diagnosis not present

## 2022-01-09 ENCOUNTER — Telehealth: Payer: Self-pay | Admitting: Cardiovascular Disease

## 2022-01-09 ENCOUNTER — Ambulatory Visit: Payer: Medicare Other | Admitting: Cardiology

## 2022-01-09 ENCOUNTER — Telehealth: Payer: Self-pay | Admitting: Pharmacist

## 2022-01-09 DIAGNOSIS — I480 Paroxysmal atrial fibrillation: Secondary | ICD-10-CM | POA: Diagnosis not present

## 2022-01-09 DIAGNOSIS — I6523 Occlusion and stenosis of bilateral carotid arteries: Secondary | ICD-10-CM | POA: Diagnosis not present

## 2022-01-09 DIAGNOSIS — Z7901 Long term (current) use of anticoagulants: Secondary | ICD-10-CM | POA: Diagnosis not present

## 2022-01-09 DIAGNOSIS — Z5181 Encounter for therapeutic drug level monitoring: Secondary | ICD-10-CM | POA: Diagnosis not present

## 2022-01-09 LAB — POCT INR: INR: 2.7 (ref 2.0–3.0)

## 2022-01-09 SURGERY — LEFT ATRIAL APPENDAGE OCCLUSION
Anesthesia: General

## 2022-01-09 MED ORDER — CARVEDILOL 6.25 MG PO TABS: 6.2500 mg | ORAL_TABLET | Freq: Two times a day (BID) | ORAL | 3 refills | Status: DC

## 2022-01-09 NOTE — Patient Instructions (Signed)
Hi Kiari,  It was great to get to catch up again! Don't forget to ask cardiology about using smaller tablet for the carvedilol to see if this helps prevent the wide fluctuations in your blood pressure.  Please reach out to me if you have any questions or need anything before our follow up!  Best, Maddie  Jeni Salles, PharmD, [facility]   Visit Information   Goals Addressed   None    Patient Care Plan: CCM Pharmacy Care Plan     Problem Identified: Problem: Hypotension, Hyperlipidemia, Atrial Fibrillation, GERD, and Constipation      Long-Range Goal: Patient-Specific Goal   Start Date: 08/13/2021  Expected End Date: 08/14/2021  Recent Progress: On track  Priority: High  Note:   Current Barriers:  Unable to independently afford treatment regimen Unable to maintain control of low blood pressure  Pharmacist Clinical Goal(s):  Patient will achieve adherence to monitoring guidelines and medication adherence to achieve therapeutic efficacy maintain control of blood pressure as evidenced by home blood pressure readings  through collaboration with PharmD and provider.   Interventions: 1:1 collaboration with [name], [name], MD regarding development and update of comprehensive plan of care as evidenced by provider attestation and co-signature Inter-disciplinary care team collaboration (see longitudinal plan of care) Comprehensive medication review performed; medication list updated in electronic medical record  Hypotension/Hypertension (BP goal > 110/60) -Not ideally controlled -Current treatment: Midodrine 10 mg 1 tablet three times daily as needed - Appropriate, Query effective, Safe, Accessible Carvedilol 25 mg 1/2 tablet as needed - Appropriate, Query effective, Query Safe, Accessible -Medications previously tried: none  -Current home readings: 178/69 (when sitting down),  -Current dietary habits: not eating  any salt -Current exercise habits: not able to exercise -Reports hypotensive/hypertensive symptoms -Educated on Importance of home blood pressure monitoring; Proper BP monitoring technique; Symptoms of hypotension and importance of maintaining adequate hydration; -Counseled to monitor BP at home daily, document, and provide log at future appointments -Recommended to continue current medication  Hyperlipidemia: (LDL goal < 100) -Uncontrolled -Current treatment: Atorvastatin 10 mg 1 tablet daily - Appropriate, Query effective, Safe, Accessible   -Medications previously tried: none  -Current dietary patterns: cannot eat red meat, chicken or fish (makes her sick); cannot do many fruits and vegetables  -Current exercise habits: unable to exercise -Educated on Cholesterol goals;  Benefits of statin for ASCVD risk reduction; Importance of limiting foods high in cholesterol; -Counseled on diet and exercise extensively Recommended to continue current medication Recommended repeat lipid panel.  Atrial Fibrillation (Goal: prevent stroke and major bleeding) -Controlled -CHADSVASC: 4 -Current treatment: Rate control: none Anticoagulation: warfarin 5 mg as directed by coumadin clinic -Medications previously tried: amiodarone -Home BP and HR readings: BP readings go directly to cardiology -Counseled on increased risk of stroke due to Afib and benefits of anticoagulation for stroke prevention; bleeding risk associated with warfarin and importance of self-monitoring for signs/symptoms of bleeding; avoidance of NSAIDs due to increased bleeding risk with anticoagulants; -Recommended to continue current medication  GERD (Goal: minimize symptoms) -Controlled -Current treatment  Pantoprazole 40 mg 1 tablet twice daily - taking as needed - Appropriate, Effective, Safe, Accessible -Medications previously tried: none  -Counseled on non-pharmacologic management of symptoms such as elevating the head  of your bed, avoiding eating 2-3 hours before bed, avoiding triggering foods such as acidic, spicy, or fatty foods, eating smaller meals, and wearing clothes that are loose around the waist Recommended as needed use of  pantoprazole.  Constipation (Goal: minimize symptoms) -Controlled -Current treatment  Linzess 145 mcg 1 capsule once daily - Appropriate, Effective, Safe, Accessible -Medications previously tried: stool softeners  -Recommended to continue current medication   Health Maintenance -Vaccine gaps: shingrix, COVID booster -Current therapy:  Calcitriol three times weekly (takes at dialysis) Ethyl chloride spray (on dialysis days) Dialyvite 0.8 mg 1 tablet up to three times daily (taking every time she eats; gets it through dialysis pharmacy) -Educated on Cost vs benefit of each product must be carefully weighed by individual consumer -Patient is satisfied with current therapy and denies issues -Recommended to continue current medication  Patient Goals/Self-Care Activities Patient will:  - take medications as prescribed as evidenced by patient report and record review check blood pressure daily, document, and provide at future appointments  Follow Up Plan: Telephone follow up appointment with care management team member scheduled for: 6 months     Patient Care Plan: LCSW Plan of Care     Problem Identified: Coping Skills (General Plan of Care)      Goal: Coping Skills Enhanced   Start Date: 08/29/2021  This Visit's Progress: On track  Recent Progress: On track  Priority: High  Note:   Current barriers:    Financial constraints related to affording oxygen Clinical Goals: Patient will work with CCM LCSW to address needs related to management of health conditions Clinical Interventions:  Assessment of needs, barriers , agencies contacted, as well as how impacting care 3/2: Patient reports she has been feeling very sick towards the end of her dialysis tx and her blood  pressure been low. As a result, she reports decreased mobility. Pt agreed to discuss concerns with providers Patient completed a sleep study and was informed oxygen levels are low. States she needs oxygen (2 liters) when she sleeps, which will be approx $16 a month; however, pt is experiencing hardship financially so she declined services 3/2: CCM LCSW contacted [place] with patient. LCSW inquired if patient was able to pay for annual membership with a check, representative accepts debit/credit card only. LCSW will collaborate with [facility] staff regarding payment options [employer] will only will assist if she was a member but membership wasn't beneficial. It will be $16 yearly for membership and Jeanine found resources. CCM LCSW will collaborate with care guide Pt receives support from LCSW at dialysis regarding transportation resources. Pt's SNAP benefits were terminated after a small increase in disability. Pt is experiencing difficulty with food restrictions due to chronic health conditions. LCSW discussed supportive resources Patient was successful in identifying healthy strategies to cope with stress (praying, walking) She receives support from daughter and son and law resides local Mindfulness or Relaxation training provided Active listening / Reflection utilized  Emotional Support Provided Problem [place] strategies reviewed Verbalization of feelings encouraged  Review various resources, discussed options and provided patient information about  Community food options  1:1 collaboration with primary care provider regarding development and update of comprehensive plan of care as evidenced by provider attestation and co-signature Inter-disciplinary care team collaboration (see longitudinal plan of care) Patient Goals/Self-Care Activities: Over the next 120 days Attend all scheduled medical appointments Utilize healthy coping skills and/or supportive resources discussed Contact PCP office with  any questions or concerns       Patient verbalizes understanding of instructions and care plan provided today and agrees to view in [place]. Active MyChart status and patient understanding of how to access instructions and care plan via MyChart confirmed with patient.  Telephone follow up appointment with pharmacy team member scheduled for: 6 months  Viona Gilmore, [facility]

## 2022-01-09 NOTE — Telephone Encounter (Signed)
Returned call to patient who wanted to know if she should hold her Warfarin prior to her LAAO procedure. Advised pt that she should continue to take it, no hold necessary, but that we will need to check her INR a few days prior to ensure that it's in range (otherwise we would hold it). She states she had it checked today and was scheduled to return for her check on 7/20. She advised them that that was the day of her procedure and she wouldn't have it done until after. Will route to Lenice Llamas to follow with patient and ensure she has it checked prior to [place] (scheduled for 02/06/22).

## 2022-01-09 NOTE — Telephone Encounter (Signed)
Patient is in RPM...average systolic is 102.Patient tells me that is too low for her and she has been feeling dizzy and faint and would like for her BP to be a bit higher (in the 111 systolic). She is currently taking 12.'5mg'$  Carvedilol twice daily, adjusted this to 6.25 twice daily and will go from there.Continue to check BP and will titrate medication as needed. Medication has been sent into the pharmacy.

## 2022-01-09 NOTE — Telephone Encounter (Signed)
Patient would like to discuss restrictions prior to her left atrial appendage occlusion procedure.

## 2022-01-10 DIAGNOSIS — Z992 Dependence on renal dialysis: Secondary | ICD-10-CM | POA: Diagnosis not present

## 2022-01-10 DIAGNOSIS — D631 Anemia in chronic kidney disease: Secondary | ICD-10-CM | POA: Diagnosis not present

## 2022-01-10 DIAGNOSIS — N186 End stage renal disease: Secondary | ICD-10-CM | POA: Diagnosis not present

## 2022-01-10 DIAGNOSIS — N2581 Secondary hyperparathyroidism of renal origin: Secondary | ICD-10-CM | POA: Diagnosis not present

## 2022-01-13 DIAGNOSIS — D631 Anemia in chronic kidney disease: Secondary | ICD-10-CM | POA: Diagnosis not present

## 2022-01-13 DIAGNOSIS — N186 End stage renal disease: Secondary | ICD-10-CM | POA: Diagnosis not present

## 2022-01-13 DIAGNOSIS — Z992 Dependence on renal dialysis: Secondary | ICD-10-CM | POA: Diagnosis not present

## 2022-01-13 DIAGNOSIS — N2581 Secondary hyperparathyroidism of renal origin: Secondary | ICD-10-CM | POA: Diagnosis not present

## 2022-01-14 ENCOUNTER — Telehealth: Payer: Self-pay | Admitting: Pharmacist

## 2022-01-14 NOTE — Telephone Encounter (Signed)
Spoke with patient regarding RPM.

## 2022-01-15 ENCOUNTER — Ambulatory Visit (INDEPENDENT_AMBULATORY_CARE_PROVIDER_SITE_OTHER): Payer: Medicare Other | Admitting: Nurse Practitioner

## 2022-01-15 ENCOUNTER — Encounter: Payer: Self-pay | Admitting: Nurse Practitioner

## 2022-01-15 VITALS — BP 152/70 | HR 60 | Ht 65.0 in | Wt 193.0 lb

## 2022-01-15 DIAGNOSIS — K5900 Constipation, unspecified: Secondary | ICD-10-CM

## 2022-01-15 DIAGNOSIS — Z992 Dependence on renal dialysis: Secondary | ICD-10-CM | POA: Diagnosis not present

## 2022-01-15 DIAGNOSIS — L608 Other nail disorders: Secondary | ICD-10-CM | POA: Diagnosis not present

## 2022-01-15 DIAGNOSIS — L603 Nail dystrophy: Secondary | ICD-10-CM | POA: Diagnosis not present

## 2022-01-15 DIAGNOSIS — D631 Anemia in chronic kidney disease: Secondary | ICD-10-CM | POA: Diagnosis not present

## 2022-01-15 DIAGNOSIS — N186 End stage renal disease: Secondary | ICD-10-CM | POA: Diagnosis not present

## 2022-01-15 DIAGNOSIS — N2581 Secondary hyperparathyroidism of renal origin: Secondary | ICD-10-CM | POA: Diagnosis not present

## 2022-01-15 MED ORDER — LINACLOTIDE 72 MCG PO CAPS: 72.0000 ug | ORAL_CAPSULE | Freq: Every day | ORAL | 2 refills | Status: DC

## 2022-01-15 NOTE — Progress Notes (Unsigned)
     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 pylori negative gastritis, subcentimeter hyperplastic gastric poyps, multiple adenomatous colon polyps and GI bleed secondary to an AVM at the splenic flexure 01/2021. S/P partial colectomy secondary to diverticular disease 2019.  She is followed by Dr. Hilarie Fredrickson.     She presents today for further evaluation regarding constipation and rectal bleeding.  She takes Linzess 145 mcg 1 tab every other day which results in passing a formed stool followed by 2-3 looser stools.  From 6/1 - 6/102023, she reported seeing a moderate amount of darker red than lighter red blood with her bowel movements.  No associated abdominal or anorectal pain.  No further rectal bleeding since 6/10.  She has a hemoglobin level or CBC done every week at her dialysis center.  She received IV iron x1 dose about 1 week ago.  She had dialysis earlier today and a hemoglobin level was done, results are pending.  She feels well at this time.  No upper or lower abdominal pain.  No nausea or vomiting.  She is no longer taking Reglan which resulted in itchiness and the sensation of drugs crawling on her.       Current Medications, Allergies, Past Medical History, Past Surgical History, Family History and Social History were reviewed in [employer] record.   Review of Systems:   Constitutional: Negative for fever, sweats, chills or weight loss.  Respiratory: Negative for shortness of breath.   Cardiovascular: Negative for chest pain, palpitations and leg swelling.  Gastrointestinal: See HPI.  Musculoskeletal: Negative for back pain or muscle aches.  Neurological: Negative for dizziness,  headaches or paresthesias.    Physical Exam: There were no vitals taken for this visit. General: Well developed, w   ***female in no acute distress. Head: Normocephalic and atraumatic. Eyes: No scleral icterus. Conjunctiva pink . Ears: Normal auditory acuity. Mouth: Dentition intact. No ulcers or lesions.  Lungs: Clear throughout to auscultation. Heart: Regular rate and rhythm, no murmur. Abdomen: Soft, nontender and nondistended. No masses or hepatomegaly. Normal bowel sounds x 4 quadrants.  Rectal: *** Musculoskeletal: Symmetrical with no gross deformities. Extremities: No edema. Neurological: Alert oriented x 4. No focal deficits.  Psychological: Alert and cooperative. Normal mood and affect  Assessment and Recommendations: ***

## 2022-01-15 NOTE — Patient Instructions (Addendum)
1) Apply a small amount of Desitin inside the anal opening and to the external anal area three times daily as needed for anal or hemorrhoidal irritation/bleeding.   2) Reduce Linzess 22mg one tab once daily as needed   3) Contact our office if rectal bleeding recurs   If you are age 1457or older, your body mass index should be between 23-30. Your Body mass index is 32.12 kg/m. If this is out of the aforementioned range listed, please consider follow up with your Primary Care Provider.  If you are age 1464or younger, your body mass index should be between 19-25. Your Body mass index is 32.12 kg/m. If this is out of the aformentioned range listed, please consider follow up with your Primary Care Provider.   ________________________________________________________  The [place] [facility] providers would like to encourage you to use [facility]to communicate with providers for non-urgent requests or questions.  Due to long hold times on the telephone, sending your provider a message by [facility] be a faster and more efficient way to get a response.  Please allow 48 business hours for a response.  Please remember that this is for non-urgent requests.  _______________________________________________________  Thank you for choosing me and [facility].  CCarl Best,CRNP

## 2022-01-16 ENCOUNTER — Encounter: Payer: Self-pay | Admitting: Nurse Practitioner

## 2022-01-16 NOTE — Progress Notes (Signed)
Addendum: Reviewed and agree with assessment and management plan. Jazzlyn Huizenga M, MD  

## 2022-01-17 ENCOUNTER — Telehealth: Payer: Self-pay | Admitting: Cardiology

## 2022-01-17 DIAGNOSIS — Z992 Dependence on renal dialysis: Secondary | ICD-10-CM | POA: Diagnosis not present

## 2022-01-17 DIAGNOSIS — E785 Hyperlipidemia, unspecified: Secondary | ICD-10-CM

## 2022-01-17 DIAGNOSIS — I4891 Unspecified atrial fibrillation: Secondary | ICD-10-CM | POA: Diagnosis not present

## 2022-01-17 DIAGNOSIS — D631 Anemia in chronic kidney disease: Secondary | ICD-10-CM | POA: Diagnosis not present

## 2022-01-17 DIAGNOSIS — N2581 Secondary hyperparathyroidism of renal origin: Secondary | ICD-10-CM | POA: Diagnosis not present

## 2022-01-17 DIAGNOSIS — N186 End stage renal disease: Secondary | ICD-10-CM | POA: Diagnosis not present

## 2022-01-17 NOTE — Telephone Encounter (Signed)
error 

## 2022-01-18 DIAGNOSIS — N186 End stage renal disease: Secondary | ICD-10-CM | POA: Diagnosis not present

## 2022-01-18 DIAGNOSIS — I158 Other secondary hypertension: Secondary | ICD-10-CM | POA: Diagnosis not present

## 2022-01-18 DIAGNOSIS — Z992 Dependence on renal dialysis: Secondary | ICD-10-CM | POA: Diagnosis not present

## 2022-01-20 DIAGNOSIS — N186 End stage renal disease: Secondary | ICD-10-CM | POA: Diagnosis not present

## 2022-01-20 DIAGNOSIS — Z992 Dependence on renal dialysis: Secondary | ICD-10-CM | POA: Diagnosis not present

## 2022-01-20 DIAGNOSIS — N2581 Secondary hyperparathyroidism of renal origin: Secondary | ICD-10-CM | POA: Diagnosis not present

## 2022-01-20 DIAGNOSIS — D631 Anemia in chronic kidney disease: Secondary | ICD-10-CM | POA: Diagnosis not present

## 2022-01-22 DIAGNOSIS — N186 End stage renal disease: Secondary | ICD-10-CM | POA: Diagnosis not present

## 2022-01-22 DIAGNOSIS — N2581 Secondary hyperparathyroidism of renal origin: Secondary | ICD-10-CM | POA: Diagnosis not present

## 2022-01-22 DIAGNOSIS — Z992 Dependence on renal dialysis: Secondary | ICD-10-CM | POA: Diagnosis not present

## 2022-01-22 DIAGNOSIS — D631 Anemia in chronic kidney disease: Secondary | ICD-10-CM | POA: Diagnosis not present

## 2022-01-24 DIAGNOSIS — D631 Anemia in chronic kidney disease: Secondary | ICD-10-CM | POA: Diagnosis not present

## 2022-01-24 DIAGNOSIS — N2581 Secondary hyperparathyroidism of renal origin: Secondary | ICD-10-CM | POA: Diagnosis not present

## 2022-01-24 DIAGNOSIS — Z992 Dependence on renal dialysis: Secondary | ICD-10-CM | POA: Diagnosis not present

## 2022-01-24 DIAGNOSIS — N186 End stage renal disease: Secondary | ICD-10-CM | POA: Diagnosis not present

## 2022-01-24 DIAGNOSIS — I10 Essential (primary) hypertension: Secondary | ICD-10-CM | POA: Diagnosis not present

## 2022-01-27 DIAGNOSIS — N2581 Secondary hyperparathyroidism of renal origin: Secondary | ICD-10-CM | POA: Diagnosis not present

## 2022-01-27 DIAGNOSIS — Z992 Dependence on renal dialysis: Secondary | ICD-10-CM | POA: Diagnosis not present

## 2022-01-27 DIAGNOSIS — N186 End stage renal disease: Secondary | ICD-10-CM | POA: Diagnosis not present

## 2022-01-27 DIAGNOSIS — D631 Anemia in chronic kidney disease: Secondary | ICD-10-CM | POA: Diagnosis not present

## 2022-01-29 DIAGNOSIS — I4891 Unspecified atrial fibrillation: Secondary | ICD-10-CM | POA: Diagnosis not present

## 2022-01-29 DIAGNOSIS — Z992 Dependence on renal dialysis: Secondary | ICD-10-CM | POA: Diagnosis not present

## 2022-01-29 DIAGNOSIS — E1129 Type 2 diabetes mellitus with other diabetic kidney complication: Secondary | ICD-10-CM | POA: Diagnosis not present

## 2022-01-29 DIAGNOSIS — D631 Anemia in chronic kidney disease: Secondary | ICD-10-CM | POA: Diagnosis not present

## 2022-01-29 DIAGNOSIS — N186 End stage renal disease: Secondary | ICD-10-CM | POA: Diagnosis not present

## 2022-01-29 DIAGNOSIS — N2581 Secondary hyperparathyroidism of renal origin: Secondary | ICD-10-CM | POA: Diagnosis not present

## 2022-01-31 DIAGNOSIS — N186 End stage renal disease: Secondary | ICD-10-CM | POA: Diagnosis not present

## 2022-01-31 DIAGNOSIS — D631 Anemia in chronic kidney disease: Secondary | ICD-10-CM | POA: Diagnosis not present

## 2022-01-31 DIAGNOSIS — N2581 Secondary hyperparathyroidism of renal origin: Secondary | ICD-10-CM | POA: Diagnosis not present

## 2022-01-31 DIAGNOSIS — Z992 Dependence on renal dialysis: Secondary | ICD-10-CM | POA: Diagnosis not present

## 2022-01-31 NOTE — H&P (View-Only) (Signed)
[facility]                                     Cardiology Office Note:    Date:  02/03/2022   ID:  Jane [name], DOB April 22, 1946, MRN [id number]  PCP:  [name], [name], MD  [facility] Cardiologist:  Dr. Einar Gip, MD / Dr. Burt Knack, MD [facility]) [facility] Electrophysiologist:  None   Referring MD: [name], [name], MD   Chief Complaint  Patient presents with   Follow-up    Pre-Watchman    History of Present Illness:    Jane [name] is a 76 y.o. female with a hx of permanent atrial fibrillation on Coumadin, ESRD on HD MWF, anemia, bilateral carotid disease, HTN, sleep apnea, and GERD.   She was referred to Dr. Burt Knack from Dr. Einar Gip for the evaluation of atrial fibrillation and long term stroke prevention due to issues with GI bleeding while on Coumadin. She has known permanent atrial fibrillation for many years and has been anticoagulated with warfarin due to ESRD on HD. In review of outpatient notes, it appears that she had a cardioversion remotely in 2010 and was maintained on amiodarone at some point. She unfortunately has had a lot of problems with recurrent GI bleeding from the large bowel.  She has had colonoscopy procedures and capsule endoscopy and was re-started back on warfarin and was tolerating it well.   She underwent per Watchman CT imaging 11/07/21 which showed a large chicken wind left atrial appendage morphology without thrombus with recommendations for a 31 mm Watchman FLX device with a mid and mid IAS puncture site in the RAO 11 CAU 5 deployment angle.   She has been followed in Dr. Irven Shelling office for INR levels.    Today she presents for pre Watchman evaluation prior to her 02/06/22 procedure date. She denies chest pain, palpitations, SOB, LE edema, dizziness, or syncope. She is a bit nervous for her procedure therefore we reviewed details once again.   Past Medical History:  Diagnosis Date   Acid reflux    Anemia of chronic disease    Arthritis     Asthma    Atrial fibrillation ([place])    AVM (arteriovenous malformation)    Bilateral carotid bruits    Complication of anesthesia    "hard to wake up, I have sleep apnea" no CPAP   Diverticulitis    Duodenal ulcer    Dysrhythmia    Afib   ESRD (end stage renal disease) ([place])    MWF [place]   Gallbladder sludge    GI bleed    Headache    Heart murmur    Hemorrhoids    History of blood transfusion    Hypertension    Malaise and fatigue    Orthostatic hypotension    PAF (paroxysmal atrial fibrillation) (HCC)    Shortness of breath    " when I walk to fast"   Sleep apnea    Syncope    Tubulovillous adenoma of colon     Past Surgical History:  Procedure Laterality Date   A/V FISTULAGRAM Right 10/18/2020   Procedure: A/V FISTULAGRAM;  Surgeon: Marty Heck, MD;  Location: [place] [facility];  Service: Cardiovascular;  Laterality: Right;   A/V FISTULAGRAM Right 09/05/2021   Procedure: A/V Fistulagram;  Surgeon: Marty Heck, MD;  Location: [place] [facility];  Service:  Cardiovascular;  Laterality: Right;   AV FISTULA PLACEMENT     BACK SURGERY     Lumbar fusion L 4 and L 5   BIOPSY  01/09/2020   Procedure: BIOPSY;  Surgeon: Rush Landmark Telford Nab., MD;  Location: [place];  Service: Gastroenterology;;   BIOPSY  01/31/2021   Procedure: BIOPSY;  Surgeon: Irving Copas., MD;  Location: [place];  Service: Gastroenterology;;   BIOPSY  09/26/2021   Procedure: BIOPSY;  Surgeon: Thornton Park, MD;  Location: [place];  Service: Gastroenterology;;   COLONOSCOPY     COLONOSCOPY N/A 02/01/2021   Procedure: COLONOSCOPY;  Surgeon: Yetta Flock, MD;  Location: [place];  Service: Gastroenterology;  Laterality: N/A;   COLONOSCOPY WITH PROPOFOL N/A 09/26/2021   Procedure: COLONOSCOPY WITH PROPOFOL;  Surgeon: Thornton Park, MD;  Location: [place];  Service: Gastroenterology;  Laterality: N/A;   COLONOSCOPY WITH PROPOFOL N/A  09/29/2021   Procedure: COLONOSCOPY WITH PROPOFOL;  Surgeon: Thornton Park, MD;  Location: [place];  Service: Gastroenterology;  Laterality: N/A;   COLONOSCOPY WITH PROPOFOL N/A 10/01/2021   Procedure: COLONOSCOPY WITH PROPOFOL;  Surgeon: Mauri Pole, MD;  Location: [place];  Service: Gastroenterology;  Laterality: N/A;   ENDOSCOPIC MUCOSAL RESECTION N/A 01/09/2020   Procedure: ENDOSCOPIC MUCOSAL RESECTION;  Surgeon: Rush Landmark Telford Nab., MD;  Location: [place];  Service: Gastroenterology;  Laterality: N/A;   ENDOSCOPIC MUCOSAL RESECTION N/A 01/31/2021   Procedure: ENDOSCOPIC MUCOSAL RESECTION;  Surgeon: Rush Landmark Telford Nab., MD;  Location: [place];  Service: Gastroenterology;  Laterality: N/A;   ENTEROSCOPY N/A 09/26/2021   Procedure: ENTEROSCOPY;  Surgeon: Thornton Park, MD;  Location: [place];  Service: Gastroenterology;  Laterality: N/A;   ESOPHAGOGASTRODUODENOSCOPY     ESOPHAGOGASTRODUODENOSCOPY (EGD) WITH PROPOFOL N/A 01/09/2020   Procedure: ESOPHAGOGASTRODUODENOSCOPY (EGD) WITH PROPOFOL;  Surgeon: Rush Landmark Telford Nab., MD;  Location: [place];  Service: Gastroenterology;  Laterality: N/A;   ESOPHAGOGASTRODUODENOSCOPY (EGD) WITH PROPOFOL N/A 01/13/2020   Procedure: ESOPHAGOGASTRODUODENOSCOPY (EGD) WITH PROPOFOL;  Surgeon: Irene Shipper, MD;  Location: [place];  Service: Gastroenterology;  Laterality: N/A;   ESOPHAGOGASTRODUODENOSCOPY (EGD) WITH PROPOFOL N/A 01/31/2021   Procedure: ESOPHAGOGASTRODUODENOSCOPY (EGD) WITH PROPOFOL;  Surgeon: Rush Landmark Telford Nab., MD;  Location: [place];  Service: Gastroenterology;  Laterality: N/A;   EUS N/A 01/09/2020   Procedure: UPPER ENDOSCOPIC ULTRASOUND (EUS) RADIAL;  Surgeon: Irving Copas., MD;  Location: [place];  Service: Gastroenterology;  Laterality: N/A;   GIVENS CAPSULE STUDY  09/29/2021   Procedure: GIVENS CAPSULE STUDY;  Surgeon: Thornton Park, MD;  Location: [place];   Service: Gastroenterology;;   HEMOSTASIS CLIP PLACEMENT  01/09/2020   Procedure: HEMOSTASIS CLIP PLACEMENT;  Surgeon: Irving Copas., MD;  Location: [place];  Service: Gastroenterology;;   HEMOSTASIS CLIP PLACEMENT  01/31/2021   Procedure: HEMOSTASIS CLIP PLACEMENT;  Surgeon: Irving Copas., MD;  Location: [place];  Service: Gastroenterology;;   HEMOSTASIS CONTROL  01/13/2020   Procedure: HEMOSTASIS CONTROL;  Surgeon: Irene Shipper, MD;  Location: [place];  Service: Gastroenterology;;  hemaspray   HOT HEMOSTASIS N/A 01/13/2020   Procedure: HOT HEMOSTASIS (ARGON PLASMA COAGULATION/BICAP);  Surgeon: Irene Shipper, MD;  Location: [place];  Service: Gastroenterology;  Laterality: N/A;   HOT HEMOSTASIS N/A 02/01/2021   Procedure: HOT HEMOSTASIS (ARGON PLASMA COAGULATION/BICAP);  Surgeon: Yetta Flock, MD;  Location: [facility];  Service: Gastroenterology;  Laterality: N/A;   HOT HEMOSTASIS N/A 10/01/2021   Procedure: HOT HEMOSTASIS (ARGON PLASMA COAGULATION/BICAP);  Surgeon: Mauri Pole, MD;  Location: [facility];  Service: Gastroenterology;  Laterality: N/A;   IR GENERIC HISTORICAL  07/09/2016   IR US GUIDE VASC ACCESS RIGHT 07/09/2016 Arne [place], MD [facility]   IR GENERIC HISTORICAL  07/09/2016   IR FLUORO GUIDE CV LINE RIGHT 07/09/2016 Arne [place], MD [facility]   KNEE ARTHROPLASTY Left    LAPAROSCOPIC SIGMOID COLECTOMY N/A 07/11/2016   Procedure: LAPAROSCOPIC SIGMOID COLECTOMY;  Surgeon: Clovis Riley, MD;  Location: [place];  Service: General;  Laterality: N/A;   MASS EXCISION Right 04/10/2020   Procedure: EXCISION SKIN NODULE RIGHT FOREARM;  Surgeon: Waynetta Sandy, MD;  Location: [place];  Service: Vascular;  Laterality: Right;   POLYPECTOMY  09/26/2021   Procedure: POLYPECTOMY;  Surgeon: Thornton Park, MD;  Location: [place];  Service: Gastroenterology;;   POLYPECTOMY  10/01/2021   Procedure: POLYPECTOMY;   Surgeon: Mauri Pole, MD;  Location: [place];  Service: Gastroenterology;;   Clide Deutscher  01/09/2020   Procedure: Clide Deutscher;  Surgeon: Irving Copas., MD;  Location: [place];  Service: Gastroenterology;;   Clide Deutscher  01/13/2020   Procedure: Clide Deutscher;  Surgeon: Irene Shipper, MD;  Location: [place];  Service: Gastroenterology;;   Lia [place] LIFTING INJECTION  01/09/2020   Procedure: SUBMUCOSAL LIFTING INJECTION;  Surgeon: Irving Copas., MD;  Location: [facility];  Service: Gastroenterology;;   TUBAL LIGATION      Current Medications: Current Meds  Medication Sig   atorvastatin (LIPITOR) 10 MG tablet TAKE ONE TABLET BY MOUTH ONCE DAILY   B Complex-C-Zn-Folic Acid (DIALYVITE 818 WITH ZINC) 0.8 MG TABS Take 1 tablet by mouth at bedtime.   CALCITRIOL PO Take 2 tablets by mouth every Monday, Wednesday, and Friday with hemodialysis.   carvedilol (COREG) 6.25 MG tablet Take 1 tablet (6.25 mg total) by mouth 2 (two) times daily. (Patient taking differently: Take 6.25 mg by mouth 2 (two) times daily as needed (If blood pressure is over 140).)   cinacalcet (SENSIPAR) 30 MG tablet Take 30 mg by mouth every Monday, Wednesday, and Friday with hemodialysis. Post dialysis   diazepam (VALIUM) 5 MG tablet Take one tablet po 30-60 min prior to procedure. Must have a driver.   diltiazem (CARDIZEM) 60 MG tablet Take 1 tablet (60 mg total) by mouth 3 (three) times daily as needed (Atrial fibrillation).   ethyl chloride spray Apply 1 application topically every Monday, Wednesday, and Friday with hemodialysis.   famotidine (PEPCID) 20 MG tablet Take 1 tablet (20 mg total) by mouth at bedtime.   linaclotide (LINZESS) 72 MCG capsule Take 1 capsule (72 mcg total) by mouth daily before breakfast.   Methoxy PEG-Epoetin Beta (MIRCERA IJ) Inject 1 Dose into the vein every Monday, Wednesday, and Friday with hemodialysis.   midodrine (PROAMATINE) 10 MG tablet Take 1  tablet (10 mg total) by mouth 3 (three) times daily as needed (low BP and dizziness and lightheadedness symptoms.). (Patient taking differently: Take 10 mg by mouth 3 (three) times daily as needed (low BP and dizziness and lightheadedness symptoms. SBP <130 mmHg).)   pantoprazole (PROTONIX) 40 MG tablet Take 1 tablet (40 mg total) by mouth 2 (two) times daily.   sevelamer carbonate (RENVELA) 800 MG tablet Take 1,600 mg by mouth in the morning and at bedtime. With meals take 800 mg with snack   warfarin (COUMADIN) 5 MG tablet Take 1 tablet (5 mg total) by mouth daily. Or as directed by coumadin clinic (Patient taking differently: Take 5 mg by mouth at bedtime. Or  as directed by coumadin clinic)     Allergies:   Latex, Penicillins, Sulfa antibiotics, Tape, and Reglan [metoclopramide]   Social History   Socioeconomic History   Marital status: Divorced    Spouse name: Not on file   Number of children: 4   Years of education: 14   Highest education level: Associate degree: occupational, Hotel manager, or vocational program  Occupational History   Occupation: Retired  Tobacco Use   Smoking status: Never    Passive exposure: Never   Smokeless tobacco: Never  Vaping Use   Vaping Use: Never used  Substance and Sexual Activity   Alcohol use: No   Drug use: No   Sexual activity: Never  Other Topics Concern   Not on file  Social History Narrative   HH 1   Divorced   Outpatient dialysis Mon, Wed, Fri   4 children: 1 daughter locally is an Designer, multimedia and 3 sons in [place] Determinants of Health   Financial Resource Strain: High Risk (08/21/2021)   Overall Financial Resource Strain (CARDIA)    Difficulty of Paying Living Expenses: Very hard  Food Insecurity: Food Insecurity Present (08/21/2021)   Hunger Vital Sign    Worried About [place] in the Last Year: Often true    [place] in the Last Year: Often true  Transportation Needs: Unmet Transportation Needs  (08/21/2021)   PRAPARE - Hydrologist (Medical): Yes    Lack of Transportation (Non-Medical): No  Physical Activity: Inactive (08/06/2021)   Exercise Vital Sign    Days of Exercise per Week: 0 days    Minutes of Exercise per Session: 0 min  Stress: Stress Concern Present (08/21/2021)   [place]    Feeling of Stress : Very much  Social Connections: Moderately Isolated (07/31/2020)   Social Connection and Isolation Panel [NHANES]    Frequency of Communication with Friends and Family: More than three times a week    Frequency of Social Gatherings with Friends and Family: Never    Attends Religious Services: More than 4 times per year    Active Member of [employer] or Organizations: No    Attends Archivist Meetings: Never    Marital Status: Divorced     Family History: The patient's family history includes Heart failure in her mother; Other in her father; Stroke in her mother. There is no history of Colon cancer, Liver disease, Esophageal cancer, Stomach cancer, Inflammatory bowel disease, Rectal cancer, or Pancreatic cancer.  ROS:   Please see the history of present illness.    All other systems reviewed and are negative.  EKGs/Labs/Other Studies Reviewed:    The following studies were reviewed today:   CT 11/07/21:  IMPRESSION: 1. The left atrial appendage is a large chicken wing morphology without thrombus.   2. A 31 mm Watchman FLX device is recommended based on the above landing zone measurements (24.6 Maximum diameter; 21% compression).   3. There is no thrombus in the left atrial appendage.   4. A mid and mid IAS puncture site is recommended.   5. Optimal deployment angle: RAO 11 CAU 5   6. Normal coronary origin. Right dominance. CAC score of 2276, which is 99th percentile for age-, sex-, and race-matched controls.   EKG:  EKG is ordered today.  The ekg ordered today  demonstrates NSR with HR 63bpm.   Recent Labs: 09/25/2021:  ALT 14 10/02/2021: BUN 31; Creatinine, Ser 9.64; Hemoglobin 7.5; Platelets 137; Potassium 3.6; Sodium 134  Recent Lipid Panel    Component Value Date/Time   CHOL 198 10/06/2019 1510   TRIG 51 10/06/2019 1510   HDL 53 10/06/2019 1510   CHOLHDL 3.7 10/06/2019 1510   VLDL 10 10/06/2019 1510   LDLCALC 135 (H) 10/06/2019 1510   Risk Assessment/Calculations:    CHA2DS2-VASc Score = 5  CHF History: 0, HTN History: 1, Diabetes History: 0, Stroke History: 0, Vascular Disease History: 1, Age Score: 2, Gender Score: 1].  Therefore, the patient's annual risk of stroke is 7.2 %.     HAS-BLED score 2 Hypertension (Uncontrolled in 30 days)  No  Abnormal renal and liver function (Dialysis, transplant, Cr >2.26 mg/dL /Cirrhosis or Bilirubin >2x Normal or AST/ALT/AP >3x Normal) Yes  Stroke No  Bleeding No  Labile INR (Unstable/high INR) No  Elderly (>65) Yes  Drugs or alcohol (? 8 drinks/week, anti-plt or NSAID) No   Physical Exam:    VS:  BP (!) 114/58   Pulse 63   Ht '5\' 5"'$  (1.651 m)   Wt 196 lb 3.2 oz (89 kg)   SpO2 99%   BMI 32.65 kg/m     Wt Readings from Last 3 Encounters:  02/03/22 196 lb 3.2 oz (89 kg)  01/15/22 193 lb (87.5 kg)  01/02/22 194 lb (88 kg)    General: Well developed, well nourished, NAD Lungs:Clear to ausculation bilaterally. No wheezes, rales, or rhonchi. Breathing is unlabored. Cardiovascular: RRR with S1 S2. + systolic murmur Extremities: No edema.  Neuro: Alert and oriented. No focal deficits. No facial asymmetry. MAE spontaneously. Psych: Responds to questions appropriately with normal affect.    ASSESSMENT/PLAN:    PAF: Referred to Dr. Burt Knack for Watchman implant due to GI bleeding while on Coumadin. Underwent pre implant imaging 11/07/21 which showed a large chicken wind left atrial appendage morphology without thrombus with recommendations for a 31 mm Watchman FLX device with a mid and mid IAS  puncture site in the RAO 11 CAU 5 deployment angle. INR have been followed by Dr. Irven Shelling team and have been stable. Obtain BMET, CBC, INR today. Will follow in the hospital in the post procedure setting.   ESRD: On HD MWF. If she stays overnight after procedure, will contact nephrology for potential inpatient HD.   Anemia of chronic disease: Obtain CBC today. Baseline appears to be in the 7.0-10.0 range. Follows with nephrology.   Bilateral carotid disease: Last doppler 62/9528 with RICA/LICA <41%. No dizziness, syncope.   HTN: Stable with no changes needed today  Medication Adjustments/Labs and Tests Ordered: Current medicines are reviewed at length with the patient today.  Concerns regarding medicines are outlined above.  Orders Placed This Encounter  Procedures   Basic Metabolic Panel (BMET)   CBC   Protime-INR   EKG 12-Lead   No orders of the defined types were placed in this encounter.   Patient Instructions  Medication Instructions:   *If you need a refill on your cardiac medications before your next appointment, please call your pharmacy*   Lab Work: BMET, CBC, INR If you have labs (blood work) drawn today and your tests are completely normal, you will receive your results only by: [place] (if you have MyChart) OR A paper copy in the mail If you have any lab test that is abnormal or we need to change your treatment, we will call you to review the results.  Testing/Procedures:    Follow-Up: At [facility], you and your health needs are our priority.  As part of our continuing mission to provide you with exceptional heart care, we have created designated Provider Care Teams.  These Care Teams include your primary Cardiologist (physician) and Advanced Practice Providers (APPs -  Physician Assistants and Nurse Practitioners) who all work together to provide you with the care you need, when you need it.  We recommend signing up for the patient portal called  "MyChart".  Sign up information is provided on this After Visit Summary.  MyChart is used to connect with patients for Virtual Visits (Telemedicine).  Patients are able to view lab/test results, encounter notes, upcoming appointments, etc.  Non-urgent messages can be sent to your provider as well.   To learn more about what you can do with MyChart, go to [email or web address].    Important Information About Sugar         Signed, Kathyrn Drown, NP  02/03/2022 12:28 PM    [facility]

## 2022-01-31 NOTE — Progress Notes (Signed)
HEART [facility] V[facility]                                     Cardiology Office Note:    Date:  02/03/2022   ID:  Jane [name], DOB 23-Nov-1945, MRN [id number]  PCP:  [name], [name], MD  [facility] Cardiologist:  Dr. Einar Gip, MD / Dr. Burt Knack, MD [facility]) [facility] HeartCare Electrophysiologist:  None   Referring MD: [name], [name], MD   Chief Complaint  Patient presents with   Follow-up    Pre-Watchman    History of Present Illness:    Jane [name] is a 76 y.o. female with a hx of permanent atrial fibrillation on Coumadin, ESRD on HD MWF, anemia, bilateral carotid disease, HTN, sleep apnea, and GERD.   She was referred to Dr. Burt Knack from Dr. Einar Gip for the evaluation of atrial fibrillation and long term stroke prevention due to issues with GI bleeding while on Coumadin. She has known permanent atrial fibrillation for many years and has been anticoagulated with warfarin due to ESRD on HD. In review of outpatient notes, it appears that she had a cardioversion remotely in 2010 and was maintained on amiodarone at some point. She unfortunately has had a lot of problems with recurrent GI bleeding from the large bowel.  She has had colonoscopy procedures and capsule endoscopy and was re-started back on warfarin and was tolerating it well.   She underwent per Watchman CT imaging 11/07/21 which showed a large chicken wind left atrial appendage morphology without thrombus with recommendations for a 31 mm Watchman FLX device with a mid and mid IAS puncture site in the RAO 11 CAU 5 deployment angle.   She has been followed in Dr. Irven Shelling office for INR levels.    Today she presents for pre Watchman evaluation prior to her 02/06/22 procedure date. She denies chest pain, palpitations, SOB, LE edema, dizziness, or syncope. She is a bit nervous for her procedure therefore we reviewed details once again.   Past Medical History:  Diagnosis Date   Acid reflux    Anemia of chronic disease    Arthritis     Asthma    Atrial fibrillation ([place])    AVM (arteriovenous malformation)    Bilateral carotid bruits    Complication of anesthesia    "hard to wake up, I have sleep apnea" no CPAP   Diverticulitis    Duodenal ulcer    Dysrhythmia    Afib   ESRD (end stage renal disease) ([place])    MWF [place]   Gallbladder sludge    GI bleed    Headache    Heart murmur    Hemorrhoids    History of blood transfusion    Hypertension    Malaise and fatigue    Orthostatic hypotension    PAF (paroxysmal atrial fibrillation) (HCC)    Shortness of breath    " when I walk to fast"   Sleep apnea    Syncope    Tubulovillous adenoma of colon     Past Surgical History:  Procedure Laterality Date   A/V FISTULAGRAM Right 10/18/2020   Procedure: A/V FISTULAGRAM;  Surgeon: Marty Heck, MD;  Location: [place] CV LAB;  Service: Cardiovascular;  Laterality: Right;   A/V FISTULAGRAM Right 09/05/2021   Procedure: A/V Fistulagram;  Surgeon: Marty Heck, MD;  Location: [place] [facility];  Service:  Cardiovascular;  Laterality: Right;   AV FISTULA PLACEMENT     BACK SURGERY     Lumbar fusion L 4 and L 5   BIOPSY  01/09/2020   Procedure: BIOPSY;  Surgeon: Rush Landmark Telford Nab., MD;  Location: [place];  Service: Gastroenterology;;   BIOPSY  01/31/2021   Procedure: BIOPSY;  Surgeon: Irving Copas., MD;  Location: [place];  Service: Gastroenterology;;   BIOPSY  09/26/2021   Procedure: BIOPSY;  Surgeon: Thornton Park, MD;  Location: [place];  Service: Gastroenterology;;   COLONOSCOPY     COLONOSCOPY N/A 02/01/2021   Procedure: COLONOSCOPY;  Surgeon: Yetta Flock, MD;  Location: [place];  Service: Gastroenterology;  Laterality: N/A;   COLONOSCOPY WITH PROPOFOL N/A 09/26/2021   Procedure: COLONOSCOPY WITH PROPOFOL;  Surgeon: Thornton Park, MD;  Location: [place];  Service: Gastroenterology;  Laterality: N/A;   COLONOSCOPY WITH PROPOFOL N/A  09/29/2021   Procedure: COLONOSCOPY WITH PROPOFOL;  Surgeon: Thornton Park, MD;  Location: [place];  Service: Gastroenterology;  Laterality: N/A;   COLONOSCOPY WITH PROPOFOL N/A 10/01/2021   Procedure: COLONOSCOPY WITH PROPOFOL;  Surgeon: Mauri Pole, MD;  Location: [place];  Service: Gastroenterology;  Laterality: N/A;   ENDOSCOPIC MUCOSAL RESECTION N/A 01/09/2020   Procedure: ENDOSCOPIC MUCOSAL RESECTION;  Surgeon: Rush Landmark Telford Nab., MD;  Location: [place];  Service: Gastroenterology;  Laterality: N/A;   ENDOSCOPIC MUCOSAL RESECTION N/A 01/31/2021   Procedure: ENDOSCOPIC MUCOSAL RESECTION;  Surgeon: Rush Landmark Telford Nab., MD;  Location: [place];  Service: Gastroenterology;  Laterality: N/A;   ENTEROSCOPY N/A 09/26/2021   Procedure: ENTEROSCOPY;  Surgeon: Thornton Park, MD;  Location: [place];  Service: Gastroenterology;  Laterality: N/A;   ESOPHAGOGASTRODUODENOSCOPY     ESOPHAGOGASTRODUODENOSCOPY (EGD) WITH PROPOFOL N/A 01/09/2020   Procedure: ESOPHAGOGASTRODUODENOSCOPY (EGD) WITH PROPOFOL;  Surgeon: Rush Landmark Telford Nab., MD;  Location: [place];  Service: Gastroenterology;  Laterality: N/A;   ESOPHAGOGASTRODUODENOSCOPY (EGD) WITH PROPOFOL N/A 01/13/2020   Procedure: ESOPHAGOGASTRODUODENOSCOPY (EGD) WITH PROPOFOL;  Surgeon: Irene Shipper, MD;  Location: [place];  Service: Gastroenterology;  Laterality: N/A;   ESOPHAGOGASTRODUODENOSCOPY (EGD) WITH PROPOFOL N/A 01/31/2021   Procedure: ESOPHAGOGASTRODUODENOSCOPY (EGD) WITH PROPOFOL;  Surgeon: Rush Landmark Telford Nab., MD;  Location: [place];  Service: Gastroenterology;  Laterality: N/A;   EUS N/A 01/09/2020   Procedure: UPPER ENDOSCOPIC ULTRASOUND (EUS) RADIAL;  Surgeon: Irving Copas., MD;  Location: [place];  Service: Gastroenterology;  Laterality: N/A;   GIVENS CAPSULE STUDY  09/29/2021   Procedure: GIVENS CAPSULE STUDY;  Surgeon: Thornton Park, MD;  Location: [place];   Service: Gastroenterology;;   HEMOSTASIS CLIP PLACEMENT  01/09/2020   Procedure: HEMOSTASIS CLIP PLACEMENT;  Surgeon: Irving Copas., MD;  Location: [place];  Service: Gastroenterology;;   HEMOSTASIS CLIP PLACEMENT  01/31/2021   Procedure: HEMOSTASIS CLIP PLACEMENT;  Surgeon: Irving Copas., MD;  Location: [place];  Service: Gastroenterology;;   HEMOSTASIS CONTROL  01/13/2020   Procedure: HEMOSTASIS CONTROL;  Surgeon: Irene Shipper, MD;  Location: [place];  Service: Gastroenterology;;  hemaspray   HOT HEMOSTASIS N/A 01/13/2020   Procedure: HOT HEMOSTASIS (ARGON PLASMA COAGULATION/BICAP);  Surgeon: Irene Shipper, MD;  Location: [place];  Service: Gastroenterology;  Laterality: N/A;   HOT HEMOSTASIS N/A 02/01/2021   Procedure: HOT HEMOSTASIS (ARGON PLASMA COAGULATION/BICAP);  Surgeon: Yetta Flock, MD;  Location: [facility] ENDOSCOPY;  Service: Gastroenterology;  Laterality: N/A;   HOT HEMOSTASIS N/A 10/01/2021   Procedure: HOT HEMOSTASIS (ARGON PLASMA COAGULATION/BICAP);  Surgeon: Mauri Pole, MD;  Location: [facility];  Service: Gastroenterology;  Laterality: N/A;   IR GENERIC HISTORICAL  07/09/2016   IR US GUIDE VASC ACCESS RIGHT 07/09/2016 Arne Cleveland, MD [facility]INTER[facility]   IR GENERIC HISTORICAL  07/09/2016   IR FLUORO GUIDE CV LINE RIGHT 07/09/2016 Arne Cleveland, MD [facility]   KNEE ARTHROPLASTY Left    LAPAROSCOPIC SIGMOID COLECTOMY N/A 07/11/2016   Procedure: LAPAROSCOPIC SIGMOID COLECTOMY;  Surgeon: Clovis Riley, MD;  Location: [place];  Service: General;  Laterality: N/A;   MASS EXCISION Right 04/10/2020   Procedure: EXCISION SKIN NODULE RIGHT FOREARM;  Surgeon: Waynetta Sandy, MD;  Location: [place];  Service: Vascular;  Laterality: Right;   POLYPECTOMY  09/26/2021   Procedure: POLYPECTOMY;  Surgeon: Thornton Park, MD;  Location: [place];  Service: Gastroenterology;;   POLYPECTOMY  10/01/2021   Procedure: POLYPECTOMY;   Surgeon: Mauri Pole, MD;  Location: [place];  Service: Gastroenterology;;   Clide Deutscher  01/09/2020   Procedure: Clide Deutscher;  Surgeon: Irving Copas., MD;  Location: [place];  Service: Gastroenterology;;   Clide Deutscher  01/13/2020   Procedure: Clide Deutscher;  Surgeon: Irene Shipper, MD;  Location: [place];  Service: Gastroenterology;;   Lia Foyer LIFTING INJECTION  01/09/2020   Procedure: SUBMUCOSAL LIFTING INJECTION;  Surgeon: Irving Copas., MD;  Location: [facility] ENDOSCOPY;  Service: Gastroenterology;;   TUBAL LIGATION      Current Medications: Current Meds  Medication Sig   atorvastatin (LIPITOR) 10 MG tablet TAKE ONE TABLET BY MOUTH ONCE DAILY   B Complex-C-Zn-Folic Acid (DIALYVITE 941 WITH ZINC) 0.8 MG TABS Take 1 tablet by mouth at bedtime.   CALCITRIOL PO Take 2 tablets by mouth every Monday, Wednesday, and Friday with hemodialysis.   carvedilol (COREG) 6.25 MG tablet Take 1 tablet (6.25 mg total) by mouth 2 (two) times daily. (Patient taking differently: Take 6.25 mg by mouth 2 (two) times daily as needed (If blood pressure is over 140).)   cinacalcet (SENSIPAR) 30 MG tablet Take 30 mg by mouth every Monday, Wednesday, and Friday with hemodialysis. Post dialysis   diazepam (VALIUM) 5 MG tablet Take one tablet po 30-60 min prior to procedure. Must have a driver.   diltiazem (CARDIZEM) 60 MG tablet Take 1 tablet (60 mg total) by mouth 3 (three) times daily as needed (Atrial fibrillation).   ethyl chloride spray Apply 1 application topically every Monday, Wednesday, and Friday with hemodialysis.   famotidine (PEPCID) 20 MG tablet Take 1 tablet (20 mg total) by mouth at bedtime.   linaclotide (LINZESS) 72 MCG capsule Take 1 capsule (72 mcg total) by mouth daily before breakfast.   Methoxy PEG-Epoetin Beta (MIRCERA IJ) Inject 1 Dose into the vein every Monday, Wednesday, and Friday with hemodialysis.   midodrine (PROAMATINE) 10 MG tablet Take 1  tablet (10 mg total) by mouth 3 (three) times daily as needed (low BP and dizziness and lightheadedness symptoms.). (Patient taking differently: Take 10 mg by mouth 3 (three) times daily as needed (low BP and dizziness and lightheadedness symptoms. SBP <130 mmHg).)   pantoprazole (PROTONIX) 40 MG tablet Take 1 tablet (40 mg total) by mouth 2 (two) times daily.   sevelamer carbonate (RENVELA) 800 MG tablet Take 1,600 mg by mouth in the morning and at bedtime. With meals take 800 mg with snack   warfarin (COUMADIN) 5 MG tablet Take 1 tablet (5 mg total) by mouth daily. Or as directed by coumadin clinic (Patient taking differently: Take 5 mg by mouth at bedtime. Or  as directed by coumadin clinic)     Allergies:   Latex, Penicillins, Sulfa antibiotics, Tape, and Reglan [metoclopramide]   Social History   Socioeconomic History   Marital status: Divorced    Spouse name: Not on file   Number of children: 4   Years of education: 14   Highest education level: Associate degree: occupational, Hotel manager, or vocational program  Occupational History   Occupation: Retired  Tobacco Use   Smoking status: Never    Passive exposure: Never   Smokeless tobacco: Never  Vaping Use   Vaping Use: Never used  Substance and Sexual Activity   Alcohol use: No   Drug use: No   Sexual activity: Never  Other Topics Concern   Not on file  Social History Narrative   HH 1   Divorced   Outpatient dialysis Mon, Wed, Fri   4 children: 1 daughter locally is an Designer, multimedia and 3 sons in [place] Determinants of Health   Financial Resource Strain: High Risk (08/21/2021)   Overall Financial Resource Strain (CARDIA)    Difficulty of Paying Living Expenses: Very hard  Food Insecurity: Food Insecurity Present (08/21/2021)   Hunger Vital Sign    Worried About [place] in the Last Year: Often true    [place] in the Last Year: Often true  Transportation Needs: Unmet Transportation Needs  (08/21/2021)   PRAPARE - Hydrologist (Medical): Yes    Lack of Transportation (Non-Medical): No  Physical Activity: Inactive (08/06/2021)   Exercise Vital Sign    Days of Exercise per Week: 0 days    Minutes of Exercise per Session: 0 min  Stress: Stress Concern Present (08/21/2021)   [place]    Feeling of Stress : Very much  Social Connections: Moderately Isolated (07/31/2020)   Social Connection and Isolation Panel [NHANES]    Frequency of Communication with Friends and Family: More than three times a week    Frequency of Social Gatherings with Friends and Family: Never    Attends Religious Services: More than 4 times per year    Active Member of [employer] or Organizations: No    Attends Archivist Meetings: Never    Marital Status: Divorced     Family History: The patient's family history includes Heart failure in her mother; Other in her father; Stroke in her mother. There is no history of Colon cancer, Liver disease, Esophageal cancer, Stomach cancer, Inflammatory bowel disease, Rectal cancer, or Pancreatic cancer.  ROS:   Please see the history of present illness.    All other systems reviewed and are negative.  EKGs/Labs/Other Studies Reviewed:    The following studies were reviewed today:   CT 11/07/21:  IMPRESSION: 1. The left atrial appendage is a large chicken wing morphology without thrombus.   2. A 31 mm Watchman FLX device is recommended based on the above landing zone measurements (24.6 Maximum diameter; 21% compression).   3. There is no thrombus in the left atrial appendage.   4. A mid and mid IAS puncture site is recommended.   5. Optimal deployment angle: RAO 11 CAU 5   6. Normal coronary origin. Right dominance. CAC score of 2276, which is 99th percentile for age-, sex-, and race-matched controls.   EKG:  EKG is ordered today.  The ekg ordered today  demonstrates NSR with HR 63bpm.   Recent Labs: 09/25/2021:  ALT 14 10/02/2021: BUN 31; Creatinine, Ser 9.64; Hemoglobin 7.5; Platelets 137; Potassium 3.6; Sodium 134  Recent Lipid Panel    Component Value Date/Time   CHOL 198 10/06/2019 1510   TRIG 51 10/06/2019 1510   HDL 53 10/06/2019 1510   CHOLHDL 3.7 10/06/2019 1510   VLDL 10 10/06/2019 1510   LDLCALC 135 (H) 10/06/2019 1510   Risk Assessment/Calculations:    CHA2DS2-VASc Score = 5  CHF History: 0, HTN History: 1, Diabetes History: 0, Stroke History: 0, Vascular Disease History: 1, Age Score: 2, Gender Score: 1].  Therefore, the patient's annual risk of stroke is 7.2 %.     HAS-BLED score 2 Hypertension (Uncontrolled in 30 days)  No  Abnormal renal and liver function (Dialysis, transplant, Cr >2.26 mg/dL /Cirrhosis or Bilirubin >2x Normal or AST/ALT/AP >3x Normal) Yes  Stroke No  Bleeding No  Labile INR (Unstable/high INR) No  Elderly (>65) Yes  Drugs or alcohol (? 8 drinks/week, anti-plt or NSAID) No   Physical Exam:    VS:  BP (!) 114/58   Pulse 63   Ht '5\' 5"'$  (1.651 m)   Wt 196 lb 3.2 oz (89 kg)   SpO2 99%   BMI 32.65 kg/m     Wt Readings from Last 3 Encounters:  02/03/22 196 lb 3.2 oz (89 kg)  01/15/22 193 lb (87.5 kg)  01/02/22 194 lb (88 kg)    General: Well developed, well nourished, NAD Lungs:Clear to ausculation bilaterally. No wheezes, rales, or rhonchi. Breathing is unlabored. Cardiovascular: RRR with S1 S2. + systolic murmur Extremities: No edema.  Neuro: Alert and oriented. No focal deficits. No facial asymmetry. MAE spontaneously. Psych: Responds to questions appropriately with normal affect.    ASSESSMENT/PLAN:    PAF: Referred to Dr. Burt Knack for Watchman implant due to GI bleeding while on Coumadin. Underwent pre implant imaging 11/07/21 which showed a large chicken wind left atrial appendage morphology without thrombus with recommendations for a 31 mm Watchman FLX device with a mid and mid IAS  puncture site in the RAO 11 CAU 5 deployment angle. INR have been followed by Dr. Irven Shelling team and have been stable. Obtain BMET, CBC, INR today. Will follow in the hospital in the post procedure setting.   ESRD: On HD MWF. If she stays overnight after procedure, will contact nephrology for potential inpatient HD.   Anemia of chronic disease: Obtain CBC today. Baseline appears to be in the 7.0-10.0 range. Follows with nephrology.   Bilateral carotid disease: Last doppler 56/3149 with RICA/LICA <70%. No dizziness, syncope.   HTN: Stable with no changes needed today  Medication Adjustments/Labs and Tests Ordered: Current medicines are reviewed at length with the patient today.  Concerns regarding medicines are outlined above.  Orders Placed This Encounter  Procedures   Basic Metabolic Panel (BMET)   CBC   Protime-INR   EKG 12-Lead   No orders of the defined types were placed in this encounter.   Patient Instructions  Medication Instructions:   *If you need a refill on your cardiac medications before your next appointment, please call your pharmacy*   Lab Work: BMET, CBC, INR If you have labs (blood work) drawn today and your tests are completely normal, you will receive your results only by: [place] (if you have MyChart) OR A paper copy in the mail If you have any lab test that is abnormal or we need to change your treatment, we will call you to review the results.  Testing/Procedures:    Follow-Up: At [facility], you and your health needs are our priority.  As part of our continuing mission to provide you with exceptional heart care, we have created designated Provider Care Teams.  These Care Teams include your primary Cardiologist (physician) and Advanced Practice Providers (APPs -  Physician Assistants and Nurse Practitioners) who all work together to provide you with the care you need, when you need it.  We recommend signing up for the patient portal called  "MyChart".  Sign up information is provided on this After Visit Summary.  MyChart is used to connect with patients for Virtual Visits (Telemedicine).  Patients are able to view lab/test results, encounter notes, upcoming appointments, etc.  Non-urgent messages can be sent to your provider as well.   To learn more about what you can do with MyChart, go to [email or web address].    Important Information About Sugar         Signed, Kathyrn Drown, NP  02/03/2022 12:28 PM    [facility]

## 2022-02-03 ENCOUNTER — Ambulatory Visit (INDEPENDENT_AMBULATORY_CARE_PROVIDER_SITE_OTHER): Payer: Medicare Other | Admitting: Cardiology

## 2022-02-03 ENCOUNTER — Encounter: Payer: Self-pay | Admitting: Cardiology

## 2022-02-03 VITALS — BP 114/58 | HR 63 | Ht 65.0 in | Wt 196.2 lb

## 2022-02-03 DIAGNOSIS — I129 Hypertensive chronic kidney disease with stage 1 through stage 4 chronic kidney disease, or unspecified chronic kidney disease: Secondary | ICD-10-CM | POA: Diagnosis not present

## 2022-02-03 DIAGNOSIS — I6523 Occlusion and stenosis of bilateral carotid arteries: Secondary | ICD-10-CM

## 2022-02-03 DIAGNOSIS — K922 Gastrointestinal hemorrhage, unspecified: Secondary | ICD-10-CM

## 2022-02-03 DIAGNOSIS — N186 End stage renal disease: Secondary | ICD-10-CM | POA: Diagnosis not present

## 2022-02-03 DIAGNOSIS — I480 Paroxysmal atrial fibrillation: Secondary | ICD-10-CM

## 2022-02-03 DIAGNOSIS — Z992 Dependence on renal dialysis: Secondary | ICD-10-CM

## 2022-02-03 DIAGNOSIS — D631 Anemia in chronic kidney disease: Secondary | ICD-10-CM | POA: Diagnosis not present

## 2022-02-03 DIAGNOSIS — Z01818 Encounter for other preprocedural examination: Secondary | ICD-10-CM

## 2022-02-03 DIAGNOSIS — N2581 Secondary hyperparathyroidism of renal origin: Secondary | ICD-10-CM | POA: Diagnosis not present

## 2022-02-03 LAB — BASIC METABOLIC PANEL
BUN/Creatinine Ratio: 4 — ABNORMAL LOW (ref 12–28)
BUN: 26 mg/dL (ref 8–27)
CO2: 33 mmol/L — ABNORMAL HIGH (ref 20–29)
Calcium: 9.2 mg/dL (ref 8.7–10.3)
Chloride: 95 mmol/L — ABNORMAL LOW (ref 96–106)
Creatinine, Ser: 6.82 mg/dL — ABNORMAL HIGH (ref 0.57–1.00)
Glucose: 120 mg/dL — ABNORMAL HIGH (ref 70–99)
Potassium: 3.6 mmol/L (ref 3.5–5.2)
Sodium: 136 mmol/L (ref 134–144)
eGFR: 6 mL/min/{1.73_m2} — ABNORMAL LOW (ref >59)

## 2022-02-03 LAB — CBC
Hematocrit: 37 % (ref 34.0–46.6)
Hemoglobin: 12 g/dL (ref 11.1–15.9)
MCH: 28.2 pg (ref 26.6–33.0)
MCHC: 32.4 g/dL (ref 31.5–35.7)
MCV: 87 fL (ref 79–97)
Platelets: 269 10*3/uL (ref 150–450)
RBC: 4.25 x10E6/uL (ref 3.77–5.28)
RDW: 17.8 % — ABNORMAL HIGH (ref 11.7–15.4)
WBC: 5 10*3/uL (ref 3.4–10.8)

## 2022-02-03 LAB — PROTIME-INR
INR: 1.8 — ABNORMAL HIGH (ref 0.9–1.2)
Prothrombin Time: 17.8 s — ABNORMAL HIGH (ref 9.1–12.0)

## 2022-02-03 NOTE — Patient Instructions (Signed)
Medication Instructions:   *If you need a refill on your cardiac medications before your next appointment, please call your pharmacy*   Lab Work: BMET, CBC, INR If you have labs (blood work) drawn today and your tests are completely normal, you will receive your results only by: [place] (if you have MyChart) OR A paper copy in the mail If you have any lab test that is abnormal or we need to change your treatment, we will call you to review the results.   Testing/Procedures:    Follow-Up: At [facility], you and your health needs are our priority.  As part of our continuing mission to provide you with exceptional heart care, we have created designated Provider Care Teams.  These Care Teams include your primary Cardiologist (physician) and Advanced Practice Providers (APPs -  Physician Assistants and Nurse Practitioners) who all work together to provide you with the care you need, when you need it.  We recommend signing up for the patient portal called "MyChart".  Sign up information is provided on this After Visit Summary.  MyChart is used to connect with patients for Virtual Visits (Telemedicine).  Patients are able to view lab/test results, encounter notes, upcoming appointments, etc.  Non-urgent messages can be sent to your provider as well.   To learn more about what you can do with MyChart, go to [email or web address].    Important Information About Sugar

## 2022-02-04 ENCOUNTER — Other Ambulatory Visit: Payer: Self-pay | Admitting: Cardiology

## 2022-02-04 ENCOUNTER — Telehealth: Payer: Self-pay | Admitting: Cardiology

## 2022-02-04 DIAGNOSIS — Z7901 Long term (current) use of anticoagulants: Secondary | ICD-10-CM

## 2022-02-04 NOTE — Telephone Encounter (Signed)
  [place] VALVE TEAM    Patient was seen by myself 02/03/22 prior to [facility] implant Thursday 02/06/22. Labs obtained including INR which came back slightly subtherapeutic at 1.8. Called to have the patient take a total of 7.'5mg'$  tonight (normally takes '5mg'$  QD) and tomorrow night prior to her procedure. We will recheck her INR on arrival to Short Stay on Thursday.   Kathyrn Drown NP-C Structural Heart Team  Pager: [phone] Phone: [phone]

## 2022-02-05 ENCOUNTER — Telehealth: Payer: Self-pay

## 2022-02-05 DIAGNOSIS — Z992 Dependence on renal dialysis: Secondary | ICD-10-CM | POA: Diagnosis not present

## 2022-02-05 DIAGNOSIS — N186 End stage renal disease: Secondary | ICD-10-CM | POA: Diagnosis not present

## 2022-02-05 DIAGNOSIS — N2581 Secondary hyperparathyroidism of renal origin: Secondary | ICD-10-CM | POA: Diagnosis not present

## 2022-02-05 DIAGNOSIS — D631 Anemia in chronic kidney disease: Secondary | ICD-10-CM | POA: Diagnosis not present

## 2022-02-05 NOTE — Progress Notes (Signed)
Pt made aware to arrive at 0530 at [facility] main entrance tomorrow 7/20 for surgery.

## 2022-02-05 NOTE — Telephone Encounter (Signed)
Confirmed procedure date of 02/06/2022. Confirmed arrival time of 0530 for procedure time at 0730 (later arrival time discussed with both Rennis Harding and Bobbie Stack). Reviewed pre-procedure instructions with patient. The patient understands to call if questions/concerns arise prior to procedure.  She was grateful for call and agrees with plan.

## 2022-02-05 NOTE — Anesthesia Preprocedure Evaluation (Addendum)
Anesthesia Evaluation  Patient identified by MRN, date of birth, ID band Patient awake    Reviewed: Allergy & Precautions, H&P , NPO status , Patient's Chart, lab work & pertinent test results  Airway Mallampati: II  TM Distance: >3 FB Neck ROM: Full    Dental no notable dental hx. (+) Teeth Intact, Dental Advisory Given   Pulmonary asthma , sleep apnea ,    Pulmonary exam normal breath sounds clear to auscultation       Cardiovascular Exercise Tolerance: Good hypertension, Pt. on medications and Pt. on home beta blockers + dysrhythmias Atrial Fibrillation  Rhythm:Irregular Rate:Normal     Neuro/Psych  Headaches, Depression    GI/Hepatic Neg liver ROS, PUD, GERD  Medicated,  Endo/Other  negative endocrine ROS  Renal/GU DialysisRenal disease  negative genitourinary   Musculoskeletal  (+) Arthritis , Osteoarthritis,    Abdominal   Peds  Hematology  (+) Blood dyscrasia, anemia ,   Anesthesia Other Findings   Reproductive/Obstetrics negative OB ROS                            Anesthesia Physical Anesthesia Plan  ASA: 3  Anesthesia Plan: General   Post-op Pain Management: Tylenol PO (pre-op)*   Induction: Intravenous  PONV Risk Score and Plan: 4 or greater and Ondansetron, Dexamethasone and Treatment may vary due to age or medical condition  Airway Management Planned: Oral ETT  Additional Equipment: Arterial line  Intra-op Plan:   Post-operative Plan: Extubation in OR  Informed Consent: I have reviewed the patients History and Physical, chart, labs and discussed the procedure including the risks, benefits and alternatives for the proposed anesthesia with the patient or authorized representative who has indicated his/her understanding and acceptance.     Dental advisory given  Plan Discussed with: CRNA  Anesthesia Plan Comments:        Anesthesia Quick Evaluation

## 2022-02-06 ENCOUNTER — Inpatient Hospital Stay (HOSPITAL_COMMUNITY): Payer: Medicare Other | Admitting: Anesthesiology

## 2022-02-06 ENCOUNTER — Inpatient Hospital Stay (HOSPITAL_COMMUNITY): Payer: Medicare Other

## 2022-02-06 ENCOUNTER — Encounter (HOSPITAL_COMMUNITY): Payer: Self-pay | Admitting: Cardiovascular Disease

## 2022-02-06 ENCOUNTER — Inpatient Hospital Stay (HOSPITAL_COMMUNITY)
Admission: RE | Disposition: A | Discharge status: Home / Self Care | Payer: Medicare Other | Source: Home / Self Care | Attending: Cardiovascular Disease

## 2022-02-06 ENCOUNTER — Other Ambulatory Visit: Payer: Self-pay

## 2022-02-06 ENCOUNTER — Inpatient Hospital Stay (HOSPITAL_COMMUNITY)
Admission: RE | Admit: 2022-02-06 | Discharge: 2022-02-06 | DRG: 273 | Disposition: A | Discharge status: Home / Self Care | Payer: Medicare Other | Attending: Cardiovascular Disease | Admitting: Cardiovascular Disease

## 2022-02-06 DIAGNOSIS — E8770 Fluid overload, unspecified: Secondary | ICD-10-CM | POA: Diagnosis not present

## 2022-02-06 DIAGNOSIS — Z95818 Presence of other cardiac implants and grafts: Secondary | ICD-10-CM

## 2022-02-06 DIAGNOSIS — M1990 Unspecified osteoarthritis, unspecified site: Secondary | ICD-10-CM | POA: Diagnosis present

## 2022-02-06 DIAGNOSIS — Z91040 Latex allergy status: Secondary | ICD-10-CM | POA: Diagnosis not present

## 2022-02-06 DIAGNOSIS — Z992 Dependence on renal dialysis: Secondary | ICD-10-CM | POA: Diagnosis not present

## 2022-02-06 DIAGNOSIS — I480 Paroxysmal atrial fibrillation: Secondary | ICD-10-CM

## 2022-02-06 DIAGNOSIS — Z006 Encounter for examination for normal comparison and control in clinical research program: Secondary | ICD-10-CM | POA: Diagnosis not present

## 2022-02-06 DIAGNOSIS — R791 Abnormal coagulation profile: Secondary | ICD-10-CM | POA: Diagnosis not present

## 2022-02-06 DIAGNOSIS — Z79899 Other long term (current) drug therapy: Secondary | ICD-10-CM

## 2022-02-06 DIAGNOSIS — G4730 Sleep apnea, unspecified: Secondary | ICD-10-CM | POA: Diagnosis present

## 2022-02-06 DIAGNOSIS — Z91048 Other nonmedicinal substance allergy status: Secondary | ICD-10-CM

## 2022-02-06 DIAGNOSIS — Z9049 Acquired absence of other specified parts of digestive tract: Secondary | ICD-10-CM | POA: Diagnosis not present

## 2022-02-06 DIAGNOSIS — Z880 Allergy status to penicillin: Secondary | ICD-10-CM

## 2022-02-06 DIAGNOSIS — D649 Anemia, unspecified: Secondary | ICD-10-CM | POA: Diagnosis not present

## 2022-02-06 DIAGNOSIS — Z882 Allergy status to sulfonamides status: Secondary | ICD-10-CM

## 2022-02-06 DIAGNOSIS — I10 Essential (primary) hypertension: Secondary | ICD-10-CM

## 2022-02-06 DIAGNOSIS — Z888 Allergy status to other drugs, medicaments and biological substances status: Secondary | ICD-10-CM

## 2022-02-06 DIAGNOSIS — I4821 Permanent atrial fibrillation: Principal | ICD-10-CM | POA: Diagnosis present

## 2022-02-06 DIAGNOSIS — I129 Hypertensive chronic kidney disease with stage 1 through stage 4 chronic kidney disease, or unspecified chronic kidney disease: Secondary | ICD-10-CM | POA: Diagnosis present

## 2022-02-06 DIAGNOSIS — I083 Combined rheumatic disorders of mitral, aortic and tricuspid valves: Secondary | ICD-10-CM | POA: Diagnosis not present

## 2022-02-06 DIAGNOSIS — I4891 Unspecified atrial fibrillation: Secondary | ICD-10-CM

## 2022-02-06 DIAGNOSIS — N186 End stage renal disease: Secondary | ICD-10-CM | POA: Diagnosis present

## 2022-02-06 DIAGNOSIS — Z96652 Presence of left artificial knee joint: Secondary | ICD-10-CM | POA: Diagnosis present

## 2022-02-06 DIAGNOSIS — Z7901 Long term (current) use of anticoagulants: Secondary | ICD-10-CM | POA: Diagnosis not present

## 2022-02-06 DIAGNOSIS — I120 Hypertensive chronic kidney disease with stage 5 chronic kidney disease or end stage renal disease: Secondary | ICD-10-CM | POA: Diagnosis present

## 2022-02-06 DIAGNOSIS — D638 Anemia in other chronic diseases classified elsewhere: Secondary | ICD-10-CM | POA: Diagnosis present

## 2022-02-06 DIAGNOSIS — K5521 Angiodysplasia of colon with hemorrhage: Secondary | ICD-10-CM | POA: Diagnosis present

## 2022-02-06 DIAGNOSIS — K922 Gastrointestinal hemorrhage, unspecified: Secondary | ICD-10-CM

## 2022-02-06 DIAGNOSIS — Z01818 Encounter for other preprocedural examination: Secondary | ICD-10-CM | POA: Diagnosis not present

## 2022-02-06 DIAGNOSIS — K219 Gastro-esophageal reflux disease without esophagitis: Secondary | ICD-10-CM | POA: Diagnosis present

## 2022-02-06 DIAGNOSIS — D631 Anemia in chronic kidney disease: Secondary | ICD-10-CM | POA: Diagnosis not present

## 2022-02-06 HISTORY — DX: Presence of other cardiac implants and grafts: Z95.818

## 2022-02-06 HISTORY — PX: TEE WITHOUT CARDIOVERSION: SHX5443

## 2022-02-06 HISTORY — PX: LEFT ATRIAL APPENDAGE OCCLUSION: EP1229

## 2022-02-06 LAB — POCT I-STAT, CHEM 8
BUN: 33 mg/dL — ABNORMAL HIGH (ref 8–23)
Calcium, Ion: 0.97 mmol/L — ABNORMAL LOW (ref 1.15–1.40)
Chloride: 97 mmol/L — ABNORMAL LOW (ref 98–111)
Creatinine, Ser: 9.3 mg/dL — ABNORMAL HIGH (ref 0.44–1.00)
Glucose, Bld: 89 mg/dL (ref 70–99)
HCT: 44 % (ref 36.0–46.0)
Hemoglobin: 15 g/dL (ref 12.0–15.0)
Potassium: 4.2 mmol/L (ref 3.5–5.1)
Sodium: 137 mmol/L (ref 135–145)
TCO2: 31 mmol/L (ref 22–32)

## 2022-02-06 LAB — SURGICAL PCR SCREEN
MRSA, PCR: NEGATIVE
Staphylococcus aureus: NEGATIVE

## 2022-02-06 LAB — PROTIME-INR
INR: 1.5 — ABNORMAL HIGH (ref 0.8–1.2)
Prothrombin Time: 17.8 seconds — ABNORMAL HIGH (ref 11.4–15.2)

## 2022-02-06 LAB — TYPE AND SCREEN
ABO/RH(D): O POS
Antibody Screen: NEGATIVE

## 2022-02-06 LAB — POCT ACTIVATED CLOTTING TIME
Activated Clotting Time: 305 seconds
Activated Clotting Time: 354 seconds

## 2022-02-06 LAB — GLUCOSE, CAPILLARY: Glucose-Capillary: 120 mg/dL — ABNORMAL HIGH (ref 70–99)

## 2022-02-06 SURGERY — LEFT ATRIAL APPENDAGE OCCLUSION
Anesthesia: General

## 2022-02-06 MED ORDER — PROPOFOL 10 MG/ML IV BOLUS
INTRAVENOUS | Status: DC | PRN
  Administered 2022-02-06: 100 mg via INTRAVENOUS

## 2022-02-06 MED ORDER — HEPARIN (PORCINE) IN NACL 1000-0.9 UT/500ML-% IV SOLN
INTRAVENOUS | Status: AC
  Filled 2022-02-06: qty 500

## 2022-02-06 MED ORDER — WARFARIN - PHARMACIST DOSING INPATIENT: Freq: Every day | Status: DC

## 2022-02-06 MED ORDER — PROTAMINE SULFATE 10 MG/ML IV SOLN
INTRAVENOUS | Status: DC | PRN
  Administered 2022-02-06: 30 mg via INTRAVENOUS

## 2022-02-06 MED ORDER — CHLORHEXIDINE GLUCONATE 0.12 % MT SOLN: 15.0000 mL | Freq: Once | OROMUCOSAL | Status: DC

## 2022-02-06 MED ORDER — WARFARIN SODIUM 7.5 MG PO TABS
7.5000 mg | ORAL_TABLET | Freq: Once | ORAL | Status: AC
Start: 2022-02-06 — End: 2022-02-06
  Administered 2022-02-06: 7.5 mg via ORAL
  Filled 2022-02-06: qty 1

## 2022-02-06 MED ORDER — PHENYLEPHRINE HCL-NACL 20-0.9 MG/250ML-% IV SOLN
INTRAVENOUS | Status: DC | PRN
  Administered 2022-02-06: 40 ug/min via INTRAVENOUS

## 2022-02-06 MED ORDER — ASPIRIN 81 MG PO TBEC: 81.0000 mg | DELAYED_RELEASE_TABLET | Freq: Every day | ORAL | Status: DC

## 2022-02-06 MED ORDER — LIDOCAINE 2% (20 MG/ML) 5 ML SYRINGE
INTRAMUSCULAR | Status: DC | PRN
  Administered 2022-02-06: 100 mg via INTRAVENOUS

## 2022-02-06 MED ORDER — ACETAMINOPHEN 325 MG PO TABS
650.0000 mg | ORAL_TABLET | ORAL | Status: DC | PRN
  Administered 2022-02-06: 650 mg via ORAL
  Filled 2022-02-06: qty 2

## 2022-02-06 MED ORDER — IOHEXOL 350 MG/ML SOLN
INTRAVENOUS | Status: DC | PRN
  Administered 2022-02-06: 50 mL

## 2022-02-06 MED ORDER — ROCURONIUM BROMIDE 10 MG/ML (PF) SYRINGE
PREFILLED_SYRINGE | INTRAVENOUS | Status: DC | PRN
  Administered 2022-02-06: 50 mg via INTRAVENOUS
  Administered 2022-02-06: 10 mg via INTRAVENOUS

## 2022-02-06 MED ORDER — ONDANSETRON HCL 4 MG/2ML IJ SOLN: 4.0000 mg | Freq: Four times a day (QID) | INTRAMUSCULAR | Status: DC | PRN

## 2022-02-06 MED ORDER — VANCOMYCIN HCL IN DEXTROSE 1-5 GM/200ML-% IV SOLN
1000.0000 mg | INTRAVENOUS | Status: AC
  Administered 2022-02-06: 1000 mg via INTRAVENOUS
  Filled 2022-02-06: qty 200

## 2022-02-06 MED ORDER — HEPARIN (PORCINE) IN NACL 2000-0.9 UNIT/L-% IV SOLN
INTRAVENOUS | Status: AC
  Filled 2022-02-06: qty 1000

## 2022-02-06 MED ORDER — ASPIRIN 81 MG PO TBEC
81.0000 mg | DELAYED_RELEASE_TABLET | Freq: Every day | ORAL | 12 refills | Status: DC
Start: 2022-02-07 — End: 2022-08-13

## 2022-02-06 MED ORDER — SODIUM CHLORIDE 0.9 % IV SOLN: INTRAVENOUS | Status: DC

## 2022-02-06 MED ORDER — ACETAMINOPHEN 500 MG PO TABS
1000.0000 mg | ORAL_TABLET | Freq: Once | ORAL | Status: AC
  Administered 2022-02-06: 1000 mg via ORAL
  Filled 2022-02-06: qty 2

## 2022-02-06 MED ORDER — CLEVIDIPINE BUTYRATE 0.5 MG/ML IV EMUL
INTRAVENOUS | Status: DC | PRN
  Administered 2022-02-06: 2 mg/h via INTRAVENOUS

## 2022-02-06 MED ORDER — HEPARIN (PORCINE) IN NACL 2000-0.9 UNIT/L-% IV SOLN
INTRAVENOUS | Status: DC | PRN
  Administered 2022-02-06: 1000 mL

## 2022-02-06 MED ORDER — ONDANSETRON HCL 4 MG/2ML IJ SOLN
INTRAMUSCULAR | Status: DC | PRN
  Administered 2022-02-06: 4 mg via INTRAVENOUS

## 2022-02-06 MED ORDER — HEPARIN SODIUM (PORCINE) 1000 UNIT/ML IJ SOLN
INTRAMUSCULAR | Status: DC | PRN
  Administered 2022-02-06: 12000 [IU] via INTRAVENOUS

## 2022-02-06 MED ORDER — DEXAMETHASONE SODIUM PHOSPHATE 10 MG/ML IJ SOLN
INTRAMUSCULAR | Status: DC | PRN
  Administered 2022-02-06: 5 mg via INTRAVENOUS

## 2022-02-06 MED ORDER — FENTANYL CITRATE (PF) 250 MCG/5ML IJ SOLN
INTRAMUSCULAR | Status: DC | PRN
Start: 2022-02-06 — End: 2022-02-06
  Administered 2022-02-06: 100 ug via INTRAVENOUS

## 2022-02-06 MED ORDER — HEPARIN (PORCINE) IN NACL 1000-0.9 UT/500ML-% IV SOLN
INTRAVENOUS | Status: DC | PRN
  Administered 2022-02-06: 500 mL

## 2022-02-06 MED ORDER — SODIUM CHLORIDE 0.9 % IV SOLN: 250.0000 mL | INTRAVENOUS | Status: DC | PRN

## 2022-02-06 MED ORDER — SODIUM CHLORIDE 0.9% FLUSH: 3.0000 mL | INTRAVENOUS | Status: DC | PRN

## 2022-02-06 MED ORDER — LIDOCAINE HCL 1 % IJ SOLN
INTRAMUSCULAR | Status: AC
  Filled 2022-02-06: qty 20

## 2022-02-06 MED ORDER — SUGAMMADEX SODIUM 200 MG/2ML IV SOLN
INTRAVENOUS | Status: DC | PRN
  Administered 2022-02-06: 200 mg via INTRAVENOUS

## 2022-02-06 SURGICAL SUPPLY — 20 items
BLANKET WARM UNDERBOD FULL ACC (MISCELLANEOUS) ×2 IMPLANT
CATH DIAG 6FR PIGTAIL ANGLED (CATHETERS) ×1 IMPLANT
CLOSURE PERCLOSE PROSTYLE (VASCULAR PRODUCTS) ×2 IMPLANT
DEVICE WATCHMAN FLX PROC (KITS) IMPLANT
DILATOR VESSEL 38 20CM 11FR (INTRODUCER) ×1 IMPLANT
KIT HEART LEFT (KITS) ×2 IMPLANT
KIT SHEA VERSACROSS LAAC CONNE (KITS) ×1 IMPLANT
PACK CARDIAC CATHETERIZATION (CUSTOM PROCEDURE TRAY) ×2 IMPLANT
PAD DEFIB RADIO PHYSIO CONN (PAD) ×2 IMPLANT
SHEATH PERFORMER 16FR 30 (SHEATH) ×1 IMPLANT
SHEATH PINNACLE 8F 10CM (SHEATH) ×1 IMPLANT
SHEATH PROBE COVER 6X72 (BAG) ×2 IMPLANT
SHIELD RADPAD SCOOP 12X17 (MISCELLANEOUS) ×3 IMPLANT
SYS WATCHMAN FXD DBL (SHEATH) ×2
SYSTEM WATCHMAN FXD DBL (SHEATH) IMPLANT
TRANSDUCER W/STOPCOCK (MISCELLANEOUS) ×2 IMPLANT
TUBING CIL FLEX 10 FLL-RA (TUBING) ×2 IMPLANT
WATCHMAN FLX 27 (Prosthesis & Implant Heart) ×1 IMPLANT
WATCHMAN FLX PROCEDURE DEVICE (KITS) ×2 IMPLANT
WATCHMAN PROCED TRUSEAL ACCESS (SHEATH) ×1 IMPLANT

## 2022-02-06 NOTE — Progress Notes (Signed)
  Echocardiogram 2D Echocardiogram has been performed.  Darlina Sicilian M 02/06/2022, 9:41 AM

## 2022-02-06 NOTE — Discharge Instructions (Signed)
[place] Procedure, Care After  Procedure MD: Dr. Armandina Stammer Clinical Coordinator: Lenice Llamas, RN  This sheet gives you information about how to care for yourself after your procedure. Your health care provider may also give you more specific instructions. If you have problems or questions, contact your health care provider.  What can I expect after the procedure? After the procedure, it is common to have: Bruising around your puncture site. Tenderness around your puncture site. Tiredness (fatigue).  Medication instructions It is very important to continue to take your blood thinner as directed by your doctor after the Watchman procedure. Call your procedure doctor's office with question or concerns. If you are on Coumadin (warfarin), you will have your INR checked the week after your procedure, with a goal INR of 2.0 - 3.0. I have scheduled this for Monday 7/24 at 10am.  Please follow your medication instructions on your discharge summary. Only take the medications listed on your discharge paperwork.  Follow up You will be seen in approximately 1 month after your procedure You will have another TEE (Transesophageal Echocardiogram) approximately 6-8 weeks after your procedure mark to check your device You will follow up the MD/APP who performed your procedure 6 months after your procedure The Watchman Clinical Coordinator will check in with you from time to time, including 1 and 2 years after your procedure.    Follow these instructions at home: Puncture site care  Follow instructions from your health care provider about how to take care of your puncture site. Make sure you: If present, leave stitches (sutures), skin glue, or adhesive strips in place.  If a large square bandage is present, this may be removed 24 hours after surgery.  Check your puncture site every day for signs of infection. Check for: Redness, swelling, or pain. Fluid or blood. If your puncture site starts to  bleed, lie down on your back, apply firm pressure to the area, and contact your health care provider. Warmth. Pus or a bad smell. Driving Do not drive yourself home if you received sedation Do not drive for at least 4 days after your procedure or however long your health care provider recommends. (Do not resume driving if you have previously been instructed not to drive for other health reasons.) Do not spend greater than 1 hour at a time in a car for the first 3 days. Stop and take a break with a 5 minute walk at least every hour.  Do not drive or use heavy machinery while taking prescription pain medicine.  Activity Avoid activities that take a lot of effort, including exercise, for at least 7 days after your procedure. For the first 3 days, avoid sitting for longer than one hour at a time.  Avoid alcoholic beverages, signing paperwork, or participating in legal proceedings for 24 hours after receiving sedation Do not lift anything that is heavier than 10 lb (4.5 kg) for one week.  No sexual activity for 1 week.  Return to your normal activities as told by your health care provider. Ask your health care provider what activities are safe for you. General instructions Take over-the-counter and prescription medicines only as told by your health care provider. Do not use any products that contain nicotine or tobacco, such as cigarettes and e-cigarettes. If you need help quitting, ask your health care provider. You may shower after 24 hours, but Do not take baths, swim, or use a hot tub for 1 week.  Do not drink alcohol for 24 hours  after your procedure. Keep all follow-up visits as told by your health care provider. This is important. Dental Work: You will require antibiotics prior to any dental work, including cleanings, for 6 months after your Watchman implantation to help protect you from infection. After 6 months, antibiotics are no longer required. Contact a health care provider if: You  have redness, mild swelling, or pain around your puncture site. You have soreness in your throat or at your puncture site that does not improve after several days You have fluid or blood coming from your puncture site that stops after applying firm pressure to the area. Your puncture site feels warm to the touch. You have pus or a bad smell coming from your puncture site. You have a fever. You have chest pain or discomfort that spreads to your neck, jaw, or arm. You are sweating a lot. You feel nauseous. You have a fast or irregular heartbeat. You have shortness of breath. You are dizzy or light-headed and feel the need to lie down. You have pain or numbness in the arm or leg closest to your puncture site. Get help right away if: Your puncture site suddenly swells. Your puncture site is bleeding and the bleeding does not stop after applying firm pressure to the area. These symptoms may represent a serious problem that is an emergency. Do not wait to see if the symptoms will go away. Get medical help right away. Call your local emergency services (911 in the [place] Do not drive yourself to the hospital. Summary After the procedure, it is normal to have bruising and tenderness at the puncture site in your groin, neck, or forearm. Check your puncture site every day for signs of infection. Get help right away if your puncture site is bleeding and the bleeding does not stop after applying firm pressure to the area. This is a medical emergency.  This information is not intended to replace advice given to you by your health care provider. Make sure you discuss any questions you have with your health care provider.

## 2022-02-06 NOTE — Anesthesia Postprocedure Evaluation (Signed)
Anesthesia Post Note  Patient: Kelly Ranieri Kaupp  Procedure(s) Performed: LEFT ATRIAL APPENDAGE OCCLUSION TRANSESOPHAGEAL ECHOCARDIOGRAM (TEE)     Patient location during evaluation: ICU Anesthesia Type: General Level of consciousness: awake and alert Pain management: pain level controlled Vital Signs Assessment: post-procedure vital signs reviewed and stable Respiratory status: spontaneous breathing, nonlabored ventilation and respiratory function stable Cardiovascular status: blood pressure returned to baseline and stable Postop Assessment: no apparent nausea or vomiting Anesthetic complications: no   There were no known notable events for this encounter.  Last Vitals:  Vitals:   02/06/22 1045 02/06/22 1113  BP: (!) 146/43 (!) 140/57  Pulse: 77   Resp: 13 20  Temp:  (!) 36.3 C  SpO2: 92% 95%    Last Pain:  Vitals:   02/06/22 1113  TempSrc: Oral  PainSc: 0-No pain                 Danissa Rundle,W. EDMOND

## 2022-02-06 NOTE — Progress Notes (Signed)
  [place] VALVE TEAM  Patient doing well s/p Watchman implant. Groin site has been stable with no evidence of bleeding or hematoma. Plan is for discharge later today. Given two consecutive subtherapeutic INR levels, we have asked for PharmD consult to assess dosing. They have recommended that she continue the 7.$RemoveBefore'5mg'KncnuGNpQPFpF$  Coumadin tonight and tomorrow then resume $RemoveBef'5mg'OBNTphKjlH$  QD on Saturday night. I have changed her INR appointment to Monday 02/10/22 at 10am. She will follow their recommendations from there.   She was also noted to be slightly volume overloaded during her procedure, therefore I have called [facility] to speak with an RN about potentially pulling more fluid tomorrow during her regular HD at the request of Dr. Burt Knack.    Medication plan will be to continue warfarin x6 weeks (03/20/22) and restart tonight at 7.$RemoveBefo'5mg'ucCXDdodCyM$  with the addition of ASA $Remo'81mg'xkRhq$  QD (through 08/2022). She will undergo a repeat TEE around the 6 week mark. If criteria met with no device leak, she will stop Coumadin and start Plavix $RemoveBefo'75mg'DjduKjaBMgY$  QD.     Kathyrn Drown NP-C Structural Heart Team  Pager: [phone] Phone: [phone]

## 2022-02-06 NOTE — Plan of Care (Signed)
  Problem: Clinical Measurements: Goal: Ability to maintain clinical measurements within normal limits will improve Outcome: Progressing   Problem: Clinical Measurements: Goal: Will remain free from infection Outcome: Progressing   Problem: Clinical Measurements: Goal: Diagnostic test results will improve Outcome: Progressing   Problem: Clinical Measurements: Goal: Respiratory complications will improve Outcome: Progressing   Problem: Clinical Measurements: Goal: Respiratory complications will improve Outcome: Progressing   Problem: Clinical Measurements: Goal: Cardiovascular complication will be avoided Outcome: Progressing   Problem: Activity: Goal: Risk for activity intolerance will decrease Outcome: Progressing   Problem: Nutrition: Goal: Adequate nutrition will be maintained Outcome: Progressing

## 2022-02-06 NOTE — Discharge Summary (Signed)
[facility]    [name],  MRN: [id number], DOB/AGE: 78-Mar-1947 78 y.o.  Admit date: 02/06/2022 Discharge date: 02/06/2022  Primary Care Physician: [name], [name], MD  Primary Cardiologist: Dr. Einar Gip, MD / Dr. Burt Knack, MD [facility]) Electrophysiologist: None  Primary Discharge Diagnosis:  Permanent Atrial Fibrillation Poor candidacy for long term anticoagulation due to h/o GI bleeding  Secondary Discharge Diagnosis:  -ESRD with HD MWF -Chronic anticoagulation  -Anemia  -Bilateral carotid disease -HTN -Sleep apnea  -GERD  Procedures This Admission:  Successful implantation of a 27 mm watchman flex device under fluoroscopic and transesophageal echo guidance  Recommendations: 1.  Continue warfarin x6 weeks, take warfarin today as directed 2.  Aspirin 81 mg daily through 6 months 3.  If criteria met at 6-week TEE, discontinue warfarin and start clopidogrel 75 mg daily  Brief HPI: Jane [name] is a 76 y.o. female with a history of permanent atrial fibrillation on Coumadin, ESRD on HD MWF, anemia, bilateral carotid disease, HTN, sleep apnea, and GERD.    She was referred to Dr. Burt Knack from Dr. Einar Gip for the evaluation of atrial fibrillation and long term stroke prevention due to issues with GI bleeding while on Coumadin. She has known permanent atrial fibrillation for many years and has been anticoagulated with warfarin due to ESRD on HD. In review of outpatient notes, it appears that she had a cardioversion remotely in 2010 and was maintained on amiodarone at some point. She unfortunately has had a lot of problems with recurrent GI bleeding from the large bowel.  She has had colonoscopy procedures and capsule endoscopy and was re-started back on warfarin and was tolerating it well.    She underwent per Watchman CT imaging 11/07/21 which showed a large chicken wind left atrial appendage morphology without thrombus with recommendations for a 31 mm Watchman  FLX device with a mid and mid IAS puncture site in the RAO 11 CAU 5 deployment angle.    She has been followed in Dr. Irven Shelling office for INR levels.    She was seen by myself 02/03/22 prior to [facility] implant Thursday 02/06/22. Labs obtained including INR which came back subtherapeutic at 1.8. She was instructed to take a total of 7.$RemoveBe'5mg'QFDCHPMzE$  x two nights (normally takes $RemoveBeforeD'5mg'QzAfSoJPuAjjYU$  QD) with recheck on day of procedure.   Today, patients INR was subtherapeutic at 1.5 despite increased Coumadin dosing. She ultimately underwent   Hospital Course:  The patient was admitted and underwent left atrial appendage occlusive device placement with Watchman FLX 59mm. She was monitored on telemetry which demonstrated atrial fibrillation. Groin site has remained stable without complication. The patient was examined and considered to be stable for same day discharge. Wound care and restrictions were reviewed with the patient. The patient has been scheduled for post procedure follow up with Kathyrn Drown, NP in approximately one month. Medication plan will be to continue warfarin x6 weeks (03/20/22) and restart tonight. She will undergo a repeat TEE around that time. If criteria met with no device leak, she will stop Coumadin and start Plavix $RemoveBefo'75mg'fAUKAALxdBw$  QD. She will also need to take ASA $Remove'81mg'SPmTNQM$  QD through 6 months (08/2022). She will need SBE x 6 months. Will RX at follow up in one month.   Per Dr. Burt Knack, the patient appeared to have increased volume during her case. I have contacted [facility] to make them aware that she will likely need additional fluid taken off during her regular HD tomorrow.  Given two consecutive subtherapeutic INR levels, we have asked for PharmD consult to assess dosing. They have recommended that she continue the 7.5mg  Coumadin tonight and tomorrow then resume 5mg  QD thereafter. I have changed her INR appointment to Monday 02/10/22 at 10am. She will follow their recommendations from there.  Physical  Exam: Vitals:   02/06/22 1130 02/06/22 1145 02/06/22 1200 02/06/22 1300  BP: (!) 140/56  (!) 128/53 135/63  Pulse: 76  79 63  Resp: 16  16 13   Temp:      TempSrc:      SpO2: (!) 87% 95% 95% 96%  Weight:      Height:       General: Well developed, well nourished, NAD Lungs:Clear to ausculation bilaterally. Breathing is unlabored. Cardiovascular: RRR with S1 S2. No murmurs Abdomen: Soft, non-tender, non-distended. No obvious abdominal masses. Extremities: No edema. Groin site with no evidence of hematoma or bleeding.  Neuro: Alert and oriented.  Psych: Responds to questions appropriately with normal affect.    Labs: Lab Results  Component Value Date   WBC 5.0 02/03/2022   HGB 15.0 02/06/2022   HCT 44.0 02/06/2022   MCV 87 02/03/2022   PLT 269 02/03/2022    Recent Labs  Lab 02/03/22 1236 02/06/22 0632  NA 136 137  K 3.6 4.2  CL 95* 97*  CO2 33*  --   BUN 26 33*  CREATININE 6.82* 9.30*  CALCIUM 9.2  --   GLUCOSE 120* 89   Discharge Medications:  Allergies as of 02/06/2022       Reactions   Latex Rash   Penicillins Other (See Comments)   Yeast infection / Childhood   Sulfa Antibiotics Rash   Tape Other (See Comments)   Plastic, silicone, and paper tape causes bruising and pulls off skin. Cloth tape works fine   Reglan [metoclopramide] Itching   Itchiness, bug crawling sensation         Medication List     TAKE these medications    aspirin EC 81 MG tablet Take 1 tablet (81 mg total) by mouth daily. Swallow whole. Start taking on: February 07, 2022   atorvastatin 10 MG tablet Commonly known as: LIPITOR TAKE ONE TABLET BY MOUTH ONCE DAILY   CALCITRIOL PO Take 2 tablets by mouth every Monday, Wednesday, and Friday with hemodialysis.   carvedilol 6.25 MG tablet Commonly known as: COREG Take 1 tablet (6.25 mg total) by mouth 2 (two) times daily. What changed:  when to take this reasons to take this   cinacalcet 30 MG tablet Commonly known as:  SENSIPAR Take 30 mg by mouth every Monday, Wednesday, and Friday with hemodialysis. Post dialysis   DIALYVITE 800 WITH ZINC 0.8 MG Tabs Take 1 tablet by mouth at bedtime.   diazepam 5 MG tablet Commonly known as: VALIUM Take one tablet po 30-60 min prior to procedure. Must have a driver.   diltiazem 60 MG tablet Commonly known as: CARDIZEM Take 1 tablet (60 mg total) by mouth 3 (three) times daily as needed (Atrial fibrillation).   ethyl chloride spray Apply 1 application topically every Monday, Wednesday, and Friday with hemodialysis.   famotidine 20 MG tablet Commonly known as: PEPCID Take 1 tablet (20 mg total) by mouth at bedtime.   linaclotide 72 MCG capsule Commonly known as: Linzess Take 1 capsule (72 mcg total) by mouth daily before breakfast.   midodrine 10 MG tablet Commonly known as: PROAMATINE Take 1 tablet (10 mg total) by mouth 3 (three)  times daily as needed (low BP and dizziness and lightheadedness symptoms.). What changed: reasons to take this   MIRCERA IJ Inject 1 Dose into the vein every Monday, Wednesday, and Friday with hemodialysis.   pantoprazole 40 MG tablet Commonly known as: PROTONIX Take 1 tablet (40 mg total) by mouth 2 (two) times daily.   sevelamer carbonate 800 MG tablet Commonly known as: RENVELA Take 1,600 mg by mouth in the morning and at bedtime. With meals take 800 mg with snack   warfarin 5 MG tablet Commonly known as: COUMADIN Take 1 tablet (5 mg total) by mouth daily. Or as directed by coumadin clinic What changed: when to take this        Disposition:  Home  Discharge Instructions     Call MD for:  difficulty breathing, headache or visual disturbances   Complete by: As directed    Call MD for:  extreme fatigue   Complete by: As directed    Call MD for:  hives   Complete by: As directed    Call MD for:  persistant dizziness or light-headedness   Complete by: As directed    Call MD for:  persistant nausea and vomiting    Complete by: As directed    Call MD for:  redness, tenderness, or signs of infection (pain, swelling, redness, odor or green/yellow discharge around incision site)   Complete by: As directed    Call MD for:  severe uncontrolled pain   Complete by: As directed    Call MD for:  temperature >100.4   Complete by: As directed    Diet - low sodium heart healthy   Complete by: As directed    Discharge instructions   Complete by: As directed    [facility] Procedure, Care After  Procedure MD: Dr. Armandina Stammer Clinical Coordinator: Lenice Llamas, RN  This sheet gives you information about how to care for yourself after your procedure. Your health care provider may also give you more specific instructions. If you have problems or questions, contact your health care provider.  What can I expect after the procedure? After the procedure, it is common to have: Bruising around your puncture site. Tenderness around your puncture site. Tiredness (fatigue).  Medication instructions It is very important to continue to take your blood thinner as directed by your doctor after the Watchman procedure. Call your procedure doctor's office with question or concerns. If you are on Coumadin (warfarin), you will have your INR checked the week after your procedure, with a goal INR of 2.0 - 3.0. You will take 7.$RemoveBef'5mg'dBRGYRzWtB$  tonight (7/20) and 7.$RemoveBefo'5mg'mRYPjSRAukb$  tomorrow night (7/21) then resume regular $RemoveBefore'5mg'TiLxrShUDXppQ$  nightly thereafter. You will have a Coumadin check Tuesday 7/25 at 10am.  Please follow your medication instructions on your discharge summary. Only take the medications listed on your discharge paperwork.  Follow up You will be seen in 1 month after your procedure You will have another TEE (Transesophageal Echocardiogram) approximately 6-8 weeks after your procedure mark to check your device You will follow up the MD/APP who performed your procedure 6 months after your procedure The Watchman Clinical Coordinator will check in with  you from time to time, including 1 and 2 years after your procedure.    Follow these instructions at home: Puncture site care  Follow instructions from your health care provider about how to take care of your puncture site. Make sure you: If present, leave stitches (sutures), skin glue, or adhesive strips in place.  If a large  square bandage is present, this may be removed 24 hours after surgery.  Check your puncture site every day for signs of infection. Check for: Redness, swelling, or pain. Fluid or blood. If your puncture site starts to bleed, lie down on your back, apply firm pressure to the area, and contact your health care provider. Warmth. Pus or a bad smell. Driving Do not drive yourself home if you received sedation Do not drive for at least 4 days after your procedure or however long your health care provider recommends. (Do not resume driving if you have previously been instructed not to drive for other health reasons.) Do not spend greater than 1 hour at a time in a car for the first 3 days. Stop and take a break with a 5 minute walk at least every hour.  Do not drive or use heavy machinery while taking prescription pain medicine.  Activity Avoid activities that take a lot of effort, including exercise, for at least 7 days after your procedure. For the first 3 days, avoid sitting for longer than one hour at a time.  Avoid alcoholic beverages, signing paperwork, or participating in legal proceedings for 24 hours after receiving sedation Do not lift anything that is heavier than 10 lb (4.5 kg) for one week.  No sexual activity for 1 week.  Return to your normal activities as told by your health care provider. Ask your health care provider what activities are safe for you. General instructions Take over-the-counter and prescription medicines only as told by your health care provider. Do not use any products that contain nicotine or tobacco, such as cigarettes and e-cigarettes.  If you need help quitting, ask your health care provider. You may shower after 24 hours, but Do not take baths, swim, or use a hot tub for 1 week.  Do not drink alcohol for 24 hours after your procedure. Keep all follow-up visits as told by your health care provider. This is important. Dental Work: You will require antibiotics prior to any dental work, including cleanings, for 6 months after your Watchman implantation to help protect you from infection. After 6 months, antibiotics are no longer required. Contact a health care provider if: You have redness, mild swelling, or pain around your puncture site. You have soreness in your throat or at your puncture site that does not improve after several days You have fluid or blood coming from your puncture site that stops after applying firm pressure to the area. Your puncture site feels warm to the touch. You have pus or a bad smell coming from your puncture site. You have a fever. You have chest pain or discomfort that spreads to your neck, jaw, or arm. You are sweating a lot. You feel nauseous. You have a fast or irregular heartbeat. You have shortness of breath. You are dizzy or light-headed and feel the need to lie down. You have pain or numbness in the arm or leg closest to your puncture site. Get help right away if: Your puncture site suddenly swells. Your puncture site is bleeding and the bleeding does not stop after applying firm pressure to the area. These symptoms may represent a serious problem that is an emergency. Do not wait to see if the symptoms will go away. Get medical help right away. Call your local emergency services (911 in the [place] Do not drive yourself to the hospital. Summary After the procedure, it is normal to have bruising and tenderness at the puncture site in your groin,  neck, or forearm. Check your puncture site every day for signs of infection. Get help right away if your puncture site is bleeding and the  bleeding does not stop after applying firm pressure to the area. This is a medical emergency.  This information is not intended to replace advice given to you by your health care provider. Make sure you discuss any questions you have with your health care provider.   Increase activity slowly   Complete by: As directed        Follow-up Information     [facility] Follow up on 02/11/2022.   Why: Coumadin follow up INR check at 10am. Please arrive by 9:45am Contact information: [address] [place] [address] [phone]        Tommie Raymond, NP Follow up on 03/03/2022.   Specialty: Cardiology Why: at 1pm. Please arrive at 12:45pm for your appointment. Contact information: [address] [place] [address] [phone]                 Duration of Discharge Encounter: Greater than 30 minutes including physician time.  Signed, Kathyrn Drown, NP  02/06/2022 4:47 PM

## 2022-02-06 NOTE — Anesthesia Procedure Notes (Signed)
Procedure Name: Intubation Date/Time: 02/06/2022 7:59 AM  Performed by: Merryl Hacker, RNPre-anesthesia Checklist: Patient identified, Emergency Drugs available, Suction available and Patient being monitored Patient Re-evaluated:Patient Re-evaluated prior to induction Oxygen Delivery Method: Circle System Utilized Preoxygenation: Pre-oxygenation with 100% oxygen Induction Type: IV induction Ventilation: Mask ventilation without difficulty and Oral airway inserted - appropriate to patient size Laryngoscope Size: Mac and 3 Grade View: Grade I Tube type: Oral Tube size: 7.0 mm Number of attempts: 1 Airway Equipment and Method: Stylet and Oral airway Placement Confirmation: ETT inserted through vocal cords under direct vision, positive ETCO2 and breath sounds checked- equal and bilateral Secured at: 22 cm Tube secured with: Tape Dental Injury: Teeth and Oropharynx as per pre-operative assessment  Comments: Eyes taped prior to DL.Gentle DL with MAC 3, grade 1 view, 7.0 ETT passed through cords. Teeth and oropharynx remain in preoperative condition.  Performed by Heide Scales, SRNA

## 2022-02-06 NOTE — Progress Notes (Signed)
Dr. Burt Knack made aware of today's PT-INR result.

## 2022-02-06 NOTE — Progress Notes (Signed)
ANTICOAGULATION CONSULT NOTE - Initial Consult  Pharmacy Consult for warfarin  Indication: atrial fibrillation  Allergies  Allergen Reactions   Latex Rash   Penicillins Other (See Comments)    Yeast infection / Childhood   Sulfa Antibiotics Rash   Tape Other (See Comments)    Plastic, silicone, and paper tape causes bruising and pulls off skin. Cloth tape works fine   Reglan [Metoclopramide] Itching    Itchiness, bug crawling sensation     Patient Measurements: Height: '5\' 5"'$  (165.1 cm) Weight: 86.2 kg (190 lb) IBW/kg (Calculated) : 57  Vital Signs: Temp: 98.2 F (36.8 C) (07/20 1014) Temp Source: Oral (07/20 0556) BP: 146/43 (07/20 1045) Pulse Rate: 77 (07/20 1045)  Labs: Recent Labs    02/03/22 1236 02/06/22 0557 02/06/22 0632  HGB 12.0  --  15.0  HCT 37.0  --  44.0  PLT 269  --   --   LABPROT 17.8* 17.8*  --   INR 1.8* 1.5*  --   CREATININE 6.82*  --  9.30*    Estimated Creatinine Clearance: 5.5 mL/min (A) (by C-G formula based on SCr of 9.3 mg/dL (H)).   Medical History: Past Medical History:  Diagnosis Date   Acid reflux    Anemia of chronic disease    Arthritis    Asthma    Atrial fibrillation (HCC)    AVM (arteriovenous malformation)    Bilateral carotid bruits    Complication of anesthesia    "hard to wake up, I have sleep apnea" no CPAP   Diverticulitis    Duodenal ulcer    Dysrhythmia    Afib   ESRD (end stage renal disease) ([place])    MWF [place]   Gallbladder sludge    GI bleed    Headache    Heart murmur    Hemorrhoids    History of blood transfusion    Hypertension    Malaise and fatigue    Orthostatic hypotension    PAF (paroxysmal atrial fibrillation) ([place])    Presence of Watchman left atrial appendage closure device 02/06/2022   Watchman FLX 62m with Dr. CBurt Knack  Shortness of breath    " when I walk to fast"   Sleep apnea    Syncope    Tubulovillous adenoma of colon     Assessment: [id number] with atrial  fibrillation on warfarin PTA 5 mg PO daily. INR 1.5 subtherapeutic. INR on 7/17 1.8 also subtherapeutic. Pt is s/p watchman + LA appendage occlusive device placement on 7/20. Hgb and PLTs wnl on 7/17. Pharmacy consulted to dose warfarin.   Goal of Therapy:  INR 2-3 Monitor platelets by anticoagulation protocol: Yes   Plan:   Warfarin 7.5 mg PO x 1 today Obtain CBC for 7/21  Daily PT-INR while on warfarin   CEliseo Gum PharmD PGY1 Pharmacy Resident   02/06/2022  11:24 AM

## 2022-02-06 NOTE — TOC Progression Note (Signed)
Transition of Care [facility]) - Progression Note    Patient Details  Name: DESHA BITNER MRN: [id number] Date of Birth: [birth year]  Transition of Care [facility]) CM/SW [place], RN Phone Number:[phone]  02/06/2022, 2:00 PM  Clinical Narrative:     Transition of Care [facility]) Screening Note   Patient Details  Name: Raea Magallon Bouyer Date of Birth: 09/06/1945   Transition of Care [facility]) CM/SW Contact:    Angelita Ingles, RN Phone Number: 02/06/2022, 2:00 PM    Transition of Care [facility]) has reviewed patient and no TOC needs have been identified at this time. We will continue to monitor patient advancement through interdisciplinary progression rounds.           Expected Discharge Plan and Services                                                 Social Determinants of Health (SDOH) Interventions    Readmission Risk Interventions     No data to display

## 2022-02-06 NOTE — Transfer of Care (Signed)
Immediate Anesthesia Transfer of Care Note  Patient: Jane [name]  Procedure(s) Performed: LEFT ATRIAL APPENDAGE OCCLUSION TRANSESOPHAGEAL ECHOCARDIOGRAM (TEE)  Patient Location: PACU and Cath Lab  Anesthesia Type:General  Level of Consciousness: awake, drowsy and patient cooperative  Airway & Oxygen Therapy: Patient Spontanous Breathing and Patient connected to nasal cannula oxygen  Post-op Assessment: Report given to RN and Post -op Vital signs reviewed and stable  Post vital signs: Reviewed and stable  Last Vitals:  Vitals Value Taken Time  BP 132/44 02/06/22 0942  Temp    Pulse    Resp 8 02/06/22 0948  SpO2 95   Vitals shown include unvalidated device data.  Last Pain:  Vitals:   02/06/22 0624  TempSrc:   PainSc: 7       Patients Stated Pain Goal: 5 (61/95/09 3267)  Complications: There were no known notable events for this encounter.

## 2022-02-06 NOTE — Interval H&P Note (Signed)
History and Physical Interval Note:  02/06/2022 7:37 AM  Jane [name]  has presented today for surgery, with the diagnosis of afib.  The various methods of treatment have been discussed with the patient and family. After consideration of risks, benefits and other options for treatment, the patient has consented to  Procedure(s): LEFT ATRIAL APPENDAGE OCCLUSION (N/A) TRANSESOPHAGEAL ECHOCARDIOGRAM (TEE) (N/A) as a surgical intervention.  The patient's history has been reviewed, patient examined, no change in status, stable for surgery.  I have reviewed the patient's chart and labs.  Questions were answered to the patient's satisfaction.     Sherren Mocha

## 2022-02-06 NOTE — Anesthesia Procedure Notes (Signed)
Arterial Line Insertion Start/End7/20/2023 7:23 AM, 02/06/2022 7:31 AM Performed by: Annye Asa, MD, Thelma Comp, CRNA, anesthesiologist  Preanesthetic checklist: patient identified, IV checked, risks and benefits discussed, surgical consent, monitors and equipment checked, pre-op evaluation, timeout performed and anesthesia consent Lidocaine 1% used for infiltration Left, radial was placed Catheter size: 20 G Hand hygiene performed , maximum sterile barriers used  and Seldinger technique used Allen's test indicative of satisfactory collateral circulation Attempts: 1 Procedure performed using ultrasound guided technique. Ultrasound Notes:anatomy identified, needle tip was noted to be adjacent to the nerve/plexus identified, no ultrasound evidence of intravascular and/or intraneural injection and image(s) printed for medical record Following insertion, line sutured, dressing applied and Biopatch. Post procedure assessment: normal  Post procedure complications: local hematoma and second provider assisted. Patient tolerated the procedure well with no immediate complications.

## 2022-02-07 ENCOUNTER — Telehealth: Payer: Self-pay | Admitting: Cardiology

## 2022-02-07 DIAGNOSIS — N2581 Secondary hyperparathyroidism of renal origin: Secondary | ICD-10-CM | POA: Diagnosis not present

## 2022-02-07 DIAGNOSIS — N186 End stage renal disease: Secondary | ICD-10-CM | POA: Diagnosis not present

## 2022-02-07 DIAGNOSIS — Z992 Dependence on renal dialysis: Secondary | ICD-10-CM | POA: Diagnosis not present

## 2022-02-07 DIAGNOSIS — D631 Anemia in chronic kidney disease: Secondary | ICD-10-CM | POA: Diagnosis not present

## 2022-02-07 MED FILL — Lidocaine HCl Local Preservative Free (PF) Inj 1%: INTRAMUSCULAR | Qty: 30 | Status: AC

## 2022-02-07 NOTE — Telephone Encounter (Signed)
  [place] VALVE TEAM   Patient contacted regarding discharge from [facility] on 02/06/22   Patient understands to follow up with provider Nell Range PA-C at the [place] office  Patient understands discharge instructions? Yes  Patient understands medications and regimen? Yes  Patient understands to bring all medications to this visit? Yes   Kathyrn Drown NP-C Structural Heart Team  Pager: [phone]

## 2022-02-10 DIAGNOSIS — N186 End stage renal disease: Secondary | ICD-10-CM | POA: Diagnosis not present

## 2022-02-10 DIAGNOSIS — Z992 Dependence on renal dialysis: Secondary | ICD-10-CM | POA: Diagnosis not present

## 2022-02-10 DIAGNOSIS — N2581 Secondary hyperparathyroidism of renal origin: Secondary | ICD-10-CM | POA: Diagnosis not present

## 2022-02-10 DIAGNOSIS — D631 Anemia in chronic kidney disease: Secondary | ICD-10-CM | POA: Diagnosis not present

## 2022-02-11 ENCOUNTER — Ambulatory Visit: Payer: Medicare Other | Admitting: Student

## 2022-02-11 DIAGNOSIS — I480 Paroxysmal atrial fibrillation: Secondary | ICD-10-CM | POA: Diagnosis not present

## 2022-02-11 DIAGNOSIS — Z5181 Encounter for therapeutic drug level monitoring: Secondary | ICD-10-CM | POA: Diagnosis not present

## 2022-02-11 DIAGNOSIS — I6523 Occlusion and stenosis of bilateral carotid arteries: Secondary | ICD-10-CM | POA: Diagnosis not present

## 2022-02-11 DIAGNOSIS — Z7901 Long term (current) use of anticoagulants: Secondary | ICD-10-CM | POA: Diagnosis not present

## 2022-02-11 LAB — POCT INR: INR: 2.7 (ref 2.0–3.0)

## 2022-02-11 NOTE — Progress Notes (Signed)
Patient presents today for INR check.  Indication for oral anticoagulation: Paroxysmal atrial fibrillation INR goal: 2.5-3.5 INR today: 2.7 Current dosing: 5 mg p.o. daily as well as aspirin 81 mg daily  Patient's INR is therapeutic.  Continue current dosing of warfarin and aspirin. Recheck in 3 weeks.   Alethia Berthold, PA-C 02/11/2022, 11:07 AM Office: [phone]

## 2022-02-12 ENCOUNTER — Telehealth: Payer: Self-pay | Admitting: Cardiology

## 2022-02-12 DIAGNOSIS — D631 Anemia in chronic kidney disease: Secondary | ICD-10-CM | POA: Diagnosis not present

## 2022-02-12 DIAGNOSIS — Z992 Dependence on renal dialysis: Secondary | ICD-10-CM | POA: Diagnosis not present

## 2022-02-12 DIAGNOSIS — N186 End stage renal disease: Secondary | ICD-10-CM | POA: Diagnosis not present

## 2022-02-12 DIAGNOSIS — N2581 Secondary hyperparathyroidism of renal origin: Secondary | ICD-10-CM | POA: Diagnosis not present

## 2022-02-12 NOTE — Telephone Encounter (Signed)
INR obtained yesterday 02/11/22 noted to be therapeutic at 2.7 therefore patient was called and instructed to stop ASA '81mg'$  QD and continue on Coumadin at current dose. She will undergo TEE 03/21/22 to ensure no Watchman device leak or thrombus. If stable, we will make further medication changes at that time.    Kathyrn Drown NP-C Structural Heart Team  Pager: [phone] Phone: [phone]

## 2022-02-14 ENCOUNTER — Telehealth: Payer: Self-pay | Admitting: Pharmacist

## 2022-02-14 NOTE — Chronic Care Management (AMB) (Signed)
    Chronic Care Management Pharmacy Assistant   Name: AZIZI BALLY  MRN: [id number] DOB: Dec 02, 1945  Reason for Encounter: ER follow up  Recent consult visits:  02/11/2022 Lawerance Cruel PA-C (cardiology) - Patient was seen for anti-coag visit paroxysmal atrial fibrillation.  Hospital visits:  Patient was seen at [facility] on 02/06/2022 (13 hours) due to atrial fibrillation. Procedure for left atrial appendage occlusion.   New?Medications Started at [facility] Discharge:?? Aspirin 81 mg daily Medication Changes at Hospital Discharge: No medication changes Medications Discontinued at Hospital Discharge: No medications discontinued Medications that remain the same after Hospital Discharge:??  -All other medications will remain the same.    Notes:  Spoke with patient, she states she is doing wonderful, she feels good and her wound is healing well.  She did mention she has been having some higher than normal and lower than normal blood pressure readings, she has spoken with Dr. Irven Shelling office, his nurse has advised her to stand up slowly, move slowly and always use her walker.  She has an appointment scheduled for Tuesday 02/18/2022 with Dr. Irven Shelling nurse to address her blood pressures.  [place] Pharmacist Assistant [phone]

## 2022-02-15 DIAGNOSIS — N186 End stage renal disease: Secondary | ICD-10-CM | POA: Diagnosis not present

## 2022-02-15 DIAGNOSIS — D631 Anemia in chronic kidney disease: Secondary | ICD-10-CM | POA: Diagnosis not present

## 2022-02-15 DIAGNOSIS — Z992 Dependence on renal dialysis: Secondary | ICD-10-CM | POA: Diagnosis not present

## 2022-02-15 DIAGNOSIS — N2581 Secondary hyperparathyroidism of renal origin: Secondary | ICD-10-CM | POA: Diagnosis not present

## 2022-02-17 DIAGNOSIS — Z992 Dependence on renal dialysis: Secondary | ICD-10-CM | POA: Diagnosis not present

## 2022-02-17 DIAGNOSIS — D631 Anemia in chronic kidney disease: Secondary | ICD-10-CM | POA: Diagnosis not present

## 2022-02-17 DIAGNOSIS — N186 End stage renal disease: Secondary | ICD-10-CM | POA: Diagnosis not present

## 2022-02-17 DIAGNOSIS — N2581 Secondary hyperparathyroidism of renal origin: Secondary | ICD-10-CM | POA: Diagnosis not present

## 2022-02-18 ENCOUNTER — Ambulatory Visit: Payer: Medicare Other | Admitting: Cardiology

## 2022-02-18 VITALS — BP 146/77 | HR 61

## 2022-02-18 DIAGNOSIS — N186 End stage renal disease: Secondary | ICD-10-CM | POA: Diagnosis not present

## 2022-02-18 DIAGNOSIS — I129 Hypertensive chronic kidney disease with stage 1 through stage 4 chronic kidney disease, or unspecified chronic kidney disease: Secondary | ICD-10-CM

## 2022-02-18 DIAGNOSIS — Z992 Dependence on renal dialysis: Secondary | ICD-10-CM | POA: Diagnosis not present

## 2022-02-18 DIAGNOSIS — I158 Other secondary hypertension: Secondary | ICD-10-CM | POA: Diagnosis not present

## 2022-02-18 DIAGNOSIS — I10 Essential (primary) hypertension: Secondary | ICD-10-CM | POA: Diagnosis not present

## 2022-02-18 NOTE — Progress Notes (Signed)
Patient is a 75yo Female who has complaints of uncontrolled BP that dips very low in/after dialysis and gets high again. Reviewed BP medications with patient.   Patient only takes the diltiazem as needed when she is in rhythm, which patient reports is not frequent. Patient is prescribed Coreg 6.'25mg'$  but has switch to an old prescription/bottle of Coreg '25mg'$  herself because she felt the 6.25 was not controlling her BP. Currentl patient only takes Coreg '25mg'$  BID (reports to be adherent) and midodrine for SBP<100 (reports to take often on dialysis days). Patient also has a home BP monitor she uses in conjunction with her RPM device. Patient states she feels okay when her SBP is 130-150 but when it get to be less than ~120 she has dizziness and lethargy. After discussing with the patient at length, the current plan is   Non-dialysis days: take Coreg '25mg'$  BID   Dialysis days:  AM - skip Coreg unless SBP > 180 and she feels bas (headache, vision changes, ect) PM - only take midodrine if SBP <115 later in the afternoon after dialysis; only take evening Coreg dose if >140   Will do a phone follow-up in 1 week

## 2022-02-19 DIAGNOSIS — N186 End stage renal disease: Secondary | ICD-10-CM | POA: Diagnosis not present

## 2022-02-19 DIAGNOSIS — N2581 Secondary hyperparathyroidism of renal origin: Secondary | ICD-10-CM | POA: Diagnosis not present

## 2022-02-19 DIAGNOSIS — D631 Anemia in chronic kidney disease: Secondary | ICD-10-CM | POA: Diagnosis not present

## 2022-02-19 DIAGNOSIS — Z992 Dependence on renal dialysis: Secondary | ICD-10-CM | POA: Diagnosis not present

## 2022-02-19 DIAGNOSIS — D509 Iron deficiency anemia, unspecified: Secondary | ICD-10-CM | POA: Diagnosis not present

## 2022-02-21 DIAGNOSIS — N186 End stage renal disease: Secondary | ICD-10-CM | POA: Diagnosis not present

## 2022-02-21 DIAGNOSIS — Z992 Dependence on renal dialysis: Secondary | ICD-10-CM | POA: Diagnosis not present

## 2022-02-21 DIAGNOSIS — D509 Iron deficiency anemia, unspecified: Secondary | ICD-10-CM | POA: Diagnosis not present

## 2022-02-21 DIAGNOSIS — N2581 Secondary hyperparathyroidism of renal origin: Secondary | ICD-10-CM | POA: Diagnosis not present

## 2022-02-21 DIAGNOSIS — D631 Anemia in chronic kidney disease: Secondary | ICD-10-CM | POA: Diagnosis not present

## 2022-02-24 DIAGNOSIS — N186 End stage renal disease: Secondary | ICD-10-CM | POA: Diagnosis not present

## 2022-02-24 DIAGNOSIS — N2581 Secondary hyperparathyroidism of renal origin: Secondary | ICD-10-CM | POA: Diagnosis not present

## 2022-02-24 DIAGNOSIS — Z992 Dependence on renal dialysis: Secondary | ICD-10-CM | POA: Diagnosis not present

## 2022-02-24 DIAGNOSIS — I10 Essential (primary) hypertension: Secondary | ICD-10-CM | POA: Diagnosis not present

## 2022-02-24 DIAGNOSIS — D631 Anemia in chronic kidney disease: Secondary | ICD-10-CM | POA: Diagnosis not present

## 2022-02-24 DIAGNOSIS — D509 Iron deficiency anemia, unspecified: Secondary | ICD-10-CM | POA: Diagnosis not present

## 2022-02-26 DIAGNOSIS — Z992 Dependence on renal dialysis: Secondary | ICD-10-CM | POA: Diagnosis not present

## 2022-02-26 DIAGNOSIS — D509 Iron deficiency anemia, unspecified: Secondary | ICD-10-CM | POA: Diagnosis not present

## 2022-02-26 DIAGNOSIS — N186 End stage renal disease: Secondary | ICD-10-CM | POA: Diagnosis not present

## 2022-02-26 DIAGNOSIS — N2581 Secondary hyperparathyroidism of renal origin: Secondary | ICD-10-CM | POA: Diagnosis not present

## 2022-02-26 DIAGNOSIS — D631 Anemia in chronic kidney disease: Secondary | ICD-10-CM | POA: Diagnosis not present

## 2022-02-26 DIAGNOSIS — I4891 Unspecified atrial fibrillation: Secondary | ICD-10-CM | POA: Diagnosis not present

## 2022-02-28 ENCOUNTER — Encounter: Payer: Medicare Other | Attending: Physical Medicine & Rehabilitation | Admitting: Physical Medicine & Rehabilitation

## 2022-02-28 ENCOUNTER — Other Ambulatory Visit: Payer: Self-pay | Admitting: Cardiology

## 2022-02-28 ENCOUNTER — Encounter: Payer: Self-pay | Admitting: Physical Medicine & Rehabilitation

## 2022-02-28 VITALS — BP 189/79 | HR 66 | Ht 65.0 in | Wt 200.4 lb

## 2022-02-28 DIAGNOSIS — M47816 Spondylosis without myelopathy or radiculopathy, lumbar region: Secondary | ICD-10-CM

## 2022-02-28 DIAGNOSIS — I6523 Occlusion and stenosis of bilateral carotid arteries: Secondary | ICD-10-CM

## 2022-02-28 DIAGNOSIS — I951 Orthostatic hypotension: Secondary | ICD-10-CM

## 2022-02-28 NOTE — Progress Notes (Signed)
Right T12,L1,L2 medial branch radio frequency neurotomy under fluoroscopic guidance   Indication: Low back pain due to lumbar spondylosis which has been relieved on 2 occasions by greater than 50% by lumbar medial branch blocks at corresponding levels.  Informed consent was obtained after describing risks and benefits of the procedure with the patient, this includes bleeding, bruising, infection, paralysis and medication side effects. The patient wishes to proceed and has given written consent. The patient was placed in a prone position. The lumbar and sacral area was marked and prepped with Betadine. A 25-gauge 1-1/2 inch needle was inserted into the skin and subcutaneous tissue at 3 sites in one ML of 1% lidocaine was injected into each site. Then a 18-gauge 10 cm radio frequency needle with a 1 cm curved active tip was inserted targeting the Right L3 SAP/transverse process junction. Bone contact was made and confirmed with lateral imaging.  motor stimulation at 2 Hz confirm proper needle location followed by injection of one ML of the solution containing 50m 2% MPF lidocaine. Then the Right L2 SAP/transverse process junction was targeted. Bone contact was made and confirmed with lateral imaging.  motor stimulation at 2 Hz confirm proper needle location followed by injection of one ML 2% MPF lidocaine. Then the Right L1 SAP/transverse process junction was targeted. Bone contact was made and confirmed with lateral imaging. motor stimulation at 2 Hz confirm proper needle location followed by injection of one ML 2% MPF lidocaine. [facility]. Needles were removed. Post procedure instructions and vital signs were performed. Patient tolerated procedure well. Followup appointment was given.

## 2022-02-28 NOTE — Patient Instructions (Signed)
You had a radio frequency procedure today This was done to alleviate joint pain in your lumbar area We injected lidocaine which is a local anesthetic.  You may experience soreness at the injection sites. You may also experienced some irritation of the nerves that were heated I'm recommending ice for 30 minutes every 2 hours as needed for the next 24-48 hours   

## 2022-02-28 NOTE — Progress Notes (Signed)
  PROCEDURE RECORD [place] [facility]   Name: Jane [name] DOB:1946/06/11 MRN: [id number]  Date:02/28/2022  Physician: [name], MD    Nurse/CMA: Rachelann Enloe, RMA  Allergies:  Allergies  Allergen Reactions   Latex Rash   Penicillins Other (See Comments)    Yeast infection / Childhood   Sulfa Antibiotics Rash   Tape Other (See Comments)    Plastic, silicone, and paper tape causes bruising and pulls off skin. Cloth tape works fine   Reglan [Metoclopramide] Itching    Itchiness, bug crawling sensation     Consent Signed: Yes.    Is patient diabetic? No.  CBG today? .  Pregnant: No. LMP: No LMP recorded. Patient is postmenopausal. (age 53-55)  Anticoagulants: no Anti-inflammatory: no Antibiotics: no  Procedure: Right T12, L1-2 Radiofrequency   Position: Prone Start Time: 9:44am  End Time: 10:03am  Fluoro Time: 42  RN/CMA Dezi Schaner RMA Cheyenne Bordeaux RMA    Time 9:30 am 10:03 am    BP 158/72 189/79    Pulse 65 65    Respirations 16 16    O2 Sat 95 95    S/S 6 6    Pain Level 9/10 0/10     D/C home with Daughter's friend, patient A & O X 3, D/C instructions reviewed, and sits independently.

## 2022-03-01 ENCOUNTER — Other Ambulatory Visit: Payer: Self-pay | Admitting: Family Medicine

## 2022-03-01 DIAGNOSIS — D631 Anemia in chronic kidney disease: Secondary | ICD-10-CM | POA: Diagnosis not present

## 2022-03-01 DIAGNOSIS — N186 End stage renal disease: Secondary | ICD-10-CM | POA: Diagnosis not present

## 2022-03-01 DIAGNOSIS — N2581 Secondary hyperparathyroidism of renal origin: Secondary | ICD-10-CM | POA: Diagnosis not present

## 2022-03-01 DIAGNOSIS — Z992 Dependence on renal dialysis: Secondary | ICD-10-CM | POA: Diagnosis not present

## 2022-03-01 DIAGNOSIS — D509 Iron deficiency anemia, unspecified: Secondary | ICD-10-CM | POA: Diagnosis not present

## 2022-03-03 ENCOUNTER — Telehealth: Payer: Self-pay

## 2022-03-03 ENCOUNTER — Ambulatory Visit: Payer: Medicare Other

## 2022-03-03 ENCOUNTER — Telehealth: Payer: Self-pay | Admitting: Pharmacist

## 2022-03-03 ENCOUNTER — Telehealth: Payer: Self-pay | Admitting: Cardiology

## 2022-03-03 DIAGNOSIS — D509 Iron deficiency anemia, unspecified: Secondary | ICD-10-CM | POA: Diagnosis not present

## 2022-03-03 DIAGNOSIS — Z992 Dependence on renal dialysis: Secondary | ICD-10-CM | POA: Diagnosis not present

## 2022-03-03 DIAGNOSIS — N186 End stage renal disease: Secondary | ICD-10-CM | POA: Diagnosis not present

## 2022-03-03 DIAGNOSIS — N2581 Secondary hyperparathyroidism of renal origin: Secondary | ICD-10-CM | POA: Diagnosis not present

## 2022-03-03 DIAGNOSIS — I480 Paroxysmal atrial fibrillation: Secondary | ICD-10-CM

## 2022-03-03 DIAGNOSIS — Z95818 Presence of other cardiac implants and grafts: Secondary | ICD-10-CM

## 2022-03-03 DIAGNOSIS — D631 Anemia in chronic kidney disease: Secondary | ICD-10-CM | POA: Diagnosis not present

## 2022-03-03 NOTE — Telephone Encounter (Signed)
-----   Message from Tommie Raymond, NP sent at 03/03/2022  3:51 PM EDT ----- Regarding: TEE  ----- Message ----- From: Sherren Mocha, MD Sent: 03/03/2022   3:25 PM EDT To: Tommie Raymond, NP  I'd cancel the TEE and change her to a 2 month CTA. OK to change meds to ASA/plavix at 6 weeks.   Thx! ----- Message ----- From: Tommie Raymond, NP Sent: 03/03/2022   9:03 AM EDT To: Sherren Mocha, MD  [facility], you are correct. We typically do post implant CTA's. There of course are circumstances that we would not. She was scheduled for TEE given your op note. Maybe we should have questioned it but we all know [facility] knows best! In the future, if patients are listed as post implant TEE's we will question it :)  Thanks! JM   ----- Message ----- From: Sherren Mocha, MD Sent: 03/01/2022  12:37 PM EDT To: Tommie Raymond, NP  [facility] - why are we doing TEE for follow-up imaging? She had a CTA pre-procedure. We have switched to CTA for follot-up surveillance right? I probably put TEE in my op note:)

## 2022-03-03 NOTE — Telephone Encounter (Signed)
From: Sherren Mocha, MD  Sent: 03/03/2022   3:25 PM EDT  To: Tommie Raymond, NP   I'd cancel the TEE and change her to a 2 month CTA. OK to change meds to ASA/plavix at 6 weeks.   Thx!      Per Dr. Burt Knack, cancelled TEE scheduled on 03/21/2022 and scheduled the patient for post-Watchman CT on 04/10/2022. The patient will get her instruction letter on 8/16. She was grateful for call and agrees with plan.

## 2022-03-03 NOTE — Chronic Care Management (AMB) (Unsigned)
Chronic Care Management Pharmacy Assistant   Name: Jane [name]  MRN: [id number] DOB: [birth year]  Reason for Encounter: Disease State and Medication Review / Hypertension Assessment and Medication Coordination Call   Recent office visits:  None  Recent consult visits:  08/11/223 [name] MD (physical medicine and rehab) - Patient was seen for Spondylosis without myelopathy or radiculopathy, lumbar region. Patient is prescribed Coreg 6.'25mg'$  but has switch to an old prescription/bottle of Coreg '25mg'$  herself because she felt the 6.25 was not controlling her BP. Currentl patient only takes Coreg '25mg'$  BID (reports to be adherent).  Follow up in 6 months.  02/18/2022 Adrian Prows MD (cardiology) - Patient was seen for hypertension with renal disease. No medication changes.  Follow up phone 1 week.   Hospital visits:  None  Medications: Outpatient Encounter Medications as of 03/03/2022  Medication Sig Note   aspirin EC 81 MG tablet Take 1 tablet (81 mg total) by mouth daily. Swallow whole.    atorvastatin (LIPITOR) 10 MG tablet TAKE ONE TABLET BY MOUTH ONCE DAILY    B Complex-C-Zn-Folic Acid (DIALYVITE 520 WITH ZINC) 0.8 MG TABS Take 1 tablet by mouth at bedtime.    CALCITRIOL PO Take 2 tablets by mouth every Monday, Wednesday, and Friday with hemodialysis.    carvedilol (COREG) 6.25 MG tablet Take 1 tablet (6.25 mg total) by mouth 2 (two) times daily.    cinacalcet (SENSIPAR) 30 MG tablet Take 30 mg by mouth every Monday, Wednesday, and Friday with hemodialysis. Post dialysis    diazepam (VALIUM) 5 MG tablet Take one tablet po 30-60 min prior to procedure. Must have a driver. 01/29/2022: Procedure only   diltiazem (CARDIZEM) 60 MG tablet Take 1 tablet (60 mg total) by mouth 3 (three) times daily as needed (Atrial fibrillation).    ethyl chloride spray Apply 1 application topically every Monday, Wednesday, and Friday with hemodialysis.    famotidine (PEPCID) 20 MG tablet Take 1 tablet  (20 mg total) by mouth at bedtime.    linaclotide (LINZESS) 72 MCG capsule Take 1 capsule (72 mcg total) by mouth daily before breakfast.    Methoxy PEG-Epoetin Beta (MIRCERA IJ) Inject 1 Dose into the vein every Monday, Wednesday, and Friday with hemodialysis.    midodrine (PROAMATINE) 10 MG tablet TAKE ONE TABLET three times daily AS NEEDED FOR low blood pressure AND dizziness AND lightheadedness)    pantoprazole (PROTONIX) 40 MG tablet Take 1 tablet (40 mg total) by mouth 2 (two) times daily.    sevelamer carbonate (RENVELA) 800 MG tablet Take 1,600 mg by mouth in the morning and at bedtime. With meals take 800 mg with snack    warfarin (COUMADIN) 5 MG tablet Take 1 tablet (5 mg total) by mouth daily. Or as directed by coumadin clinic (Patient taking differently: Take 5 mg by mouth at bedtime. Or as directed by coumadin clinic)    No facility-administered encounter medications on file as of 03/03/2022.  Reviewed chart for medication changes ahead of medication coordination call.  No results found for: "HGBA1C"  Recent Office Vitals: BP Readings from Last 3 Encounters:  02/28/22 (!) 189/79  02/18/22 (!) 146/77  02/06/22 (!) 125/48   Pulse Readings from Last 3 Encounters:  02/28/22 66  02/18/22 61  02/06/22 70    Wt Readings from Last 3 Encounters:  02/28/22 200 lb 6.4 oz (90.9 kg)  02/06/22 190 lb (86.2 kg)  02/03/22 196 lb 3.2 oz (89 kg)     Kidney  Function Lab Results  Component Value Date/Time   CREATININE 9.30 (H) 02/06/2022 06:32 AM   CREATININE 6.82 (H) 02/03/2022 12:36 PM   GFR 2.96 (LL) 09/19/2021 02:30 PM   GFRNONAA 4 (L) 10/02/2021 11:13 AM   GFRAA 4 (L) 01/18/2020 07:14 AM       Latest Ref Rng & Units 02/06/2022    6:32 AM 02/03/2022   12:36 PM 10/02/2021   11:13 AM  BMP  Glucose 70 - 99 mg/dL 89  120  107   BUN 8 - 23 mg/dL 33  26  31   Creatinine 0.44 - 1.00 mg/dL 9.30  6.82  9.64   BUN/Creat Ratio 12 - 28  4    Sodium 135 - 145 mmol/L 137  136  134    Potassium 3.5 - 5.1 mmol/L 4.2  3.6  3.6   Chloride 98 - 111 mmol/L 97  95  97   CO2 20 - 29 mmol/L  33  25   Calcium 8.7 - 10.3 mg/dL  9.2  9.0    Patient obtains medications through Vials  90 Days    Last adherence delivery included: Atorvastatin 10 mg - 1 tablet once daily Linzess 145 mcg - 1 capsule before breakfast Pantoprazole 40 mg - 1 tablet twice daily Famotidine 20 mg - 1 tablet at bedtime Warfarin 5 mg - 1 tablet once daily as directed.  Dialyvite 800 with zinc - take 1 tablet every morning Diltiazem 60 mg  - 1 tablet 3 times daily as needed for Afib  Midodrine 10 mg - 1 tablet 3 times daily as needed   Patient declined (meds) last month:   No medications declined   Dr. Zenovia Jarred started patient on Metoclopramide 5 mg, patient is requesting this medication to be filled every 30 days until she knows if Dr. Hilarie Fredrickson will continue with it.   Patient is due for next adherence delivery on: 03/13/2022   Called patient and reviewed medications and coordinated delivery.   This delivery to include: Atorvastatin 10 mg - 1 tablet once daily Linzess 72 mcg - 1 capsule before breakfast (verified with patient, this is what she is taking) Pantoprazole 40 mg - 1 tablet twice daily Famotidine 20 mg - 1 tablet at bedtime  Dialyvite 800 with zinc - take 1 tablet every morning Warfarin 5 mg - 1 tablet once daily as directed.    Patient will need a short fill: Patient denies needing a short fill   Coordinated acute fill: Patient denies any acute fills.    Patient declined the following medications:  Carvedilol 25 mg - 1 tablet twice daily (she has plenty on hand) Diltiazem 60 mg  - 1 tablet 3 times daily as needed for Afib  Midodrine 10 mg - 1 tablet 3 times daily as needed    Renvela is supplied at the dialysis center   Confirmed delivery date of 03/13/2022, advised patient that pharmacy will contact them the morning of delivery.   Current antihypertensive regimen:   Carvedilol 25 mg twice daily Diltiazem 60 mg 1 tablet 3 times daily as needed (afib)  How often are you checking your Blood Pressure? Patient is checking blood pressures several times per day. He monitor is connected with Dr. Irven Shelling office, all her readings go directly to Dr. Irven Shelling office.  Current home BP readings: Patients reading this AM was 145/70, all her readings to to Dr. Irven Shelling office, she is not sure of all the previous readings.   What  recent interventions/DTPs have been made by any provider to improve Blood Pressure control since last CPP Visit: Patient states Carvedilol has been increased to 25 mg twice daily with Dr. Irven Shelling office.  The last note states, Patient is prescribed Coreg 6.'25mg'$  but has switch to an old prescription/bottle of Coreg '25mg'$  herself because she felt the 6.25 was not controlling her BP. Currentl patient only takes Coreg '25mg'$  BID (reports to be adherent)  Any recent hospitalizations or ED visits since last visit with CPP?  No recent hospital visits.   What diet changes have been made to improve Blood Pressure Control?  Patient eats what she wants and doesn't add salt and doesn't eat greens Breakfast - patient will have [place] bacon, eggs and a roll with jelly Lunch - patient doesn't eat lunch, breakfast is later in the day Dinner - patient will eat something light, sandwich or fruit  What exercise is being done to improve your Blood Pressure Control?  Patient is limited due to back pain  Adherence Review: Is the patient currently on ACE/ARB medication? No  Does the patient have >5 day gap between last estimated fill dates? No  Care Gaps: AWV - scheduled for 08/12/2022 Last BP - 189/79 on 02/28/2022 Covid booster - overdue Flu - due Shingrix - postponed  Star Rating Drugs: Atorvastatin 10 mg - last filled 12/09/2021 90 DS at [place] [phone]

## 2022-03-03 NOTE — Telephone Encounter (Signed)
  [facility] TEAM  Patient underwent LAAO closure 02/06/22 with Dr. Burt Knack. Per op note, patient was scheduled to undergo post op TEE to assess for leak or device thrombus. Spoke with Dr. Burt Knack, now with plans to change post op imaging to 60 day CTA and ok to change medications to ASA/Plavix at 6 weeks. This has been communicated to the [employer] as well.   Kathyrn Drown NP-C Structural Heart Team  Pager: [phone] Phone: [phone]

## 2022-03-05 ENCOUNTER — Ambulatory Visit: Payer: Medicare Other

## 2022-03-05 DIAGNOSIS — D631 Anemia in chronic kidney disease: Secondary | ICD-10-CM | POA: Diagnosis not present

## 2022-03-05 DIAGNOSIS — Z992 Dependence on renal dialysis: Secondary | ICD-10-CM | POA: Diagnosis not present

## 2022-03-05 DIAGNOSIS — N186 End stage renal disease: Secondary | ICD-10-CM | POA: Diagnosis not present

## 2022-03-05 DIAGNOSIS — D509 Iron deficiency anemia, unspecified: Secondary | ICD-10-CM | POA: Diagnosis not present

## 2022-03-05 DIAGNOSIS — N2581 Secondary hyperparathyroidism of renal origin: Secondary | ICD-10-CM | POA: Diagnosis not present

## 2022-03-05 NOTE — Progress Notes (Deleted)
HEART AND [place]                                     Cardiology Office Note:    Date:  03/05/2022   ID:  LEVA BAINE, DOB [birth year], MRN [id number]  PCP:  [name], [name], MD  [facility] Cardiologist:  Dr. [facility] Electrophysiologist:  None   Referring MD: [name], [name], MD   45 days post Watchman  History of Present Illness:    JAANAI SALEMI is a 76 y.o. female with a hx of ESRD on HD MWF, anemia, bilateral carotid disease, HTN, sleep apnea, GERD, permanent atrial fibrillation on Coumadin and GI Bleeding s/p LAAO with Watchman (02/06/22) who presents to clinic for follow up.   She was referred to Dr. Burt Knack from Dr. Einar Gip for the evaluation of atrial fibrillation and long term stroke prevention due to issues with GI bleeding while on Coumadin. She has known permanent atrial fibrillation for many years and has been anticoagulated with warfarin due to ESRD on HD. In review of outpatient notes, it appears that she had a cardioversion remotely in 2010 and was maintained on amiodarone at some point. She unfortunately has had a lot of problems with recurrent GI bleeding from the large bowel.  She has had colonoscopy procedures and capsule endoscopy and was re-started back on warfarin and was tolerating it well.   She underwent successful implantation of a 27 mm watchman flex on 02/06/22 by Dr. Burt Knack. She was discharged on aspirin and Coumadin. Plan to convert her to DAPT with aspirin 81 mg daily and Plavix 75 mg daily at 6 weeks post implant for a total 6 months. Post op CT scheduled for 04/10/22.   Today the patient presents to clinic for follow up.     Past Medical History:  Diagnosis Date   Acid reflux    Anemia of chronic disease    Arthritis    Asthma    Atrial fibrillation (HCC)    AVM (arteriovenous malformation)    Bilateral carotid bruits    Complication of anesthesia    "hard to wake up, I have sleep apnea"  no CPAP   Diverticulitis    Duodenal ulcer    Dysrhythmia    Afib   ESRD (end stage renal disease) ([place])    MWF [place]   Gallbladder sludge    GI bleed    Headache    Heart murmur    Hemorrhoids    History of blood transfusion    Hypertension    Malaise and fatigue    Orthostatic hypotension    PAF (paroxysmal atrial fibrillation) ([place])    Presence of Watchman left atrial appendage closure device 02/06/2022   Watchman FLX 38m with Dr. CBurt Knack  Shortness of breath    " when I walk to fast"   Sleep apnea    Syncope    Tubulovillous adenoma of colon     Past Surgical History:  Procedure Laterality Date   A/V FISTULAGRAM Right 10/18/2020   Procedure: A/V FISTULAGRAM;  Surgeon: CMarty Heck MD;  [facility];  Service: Cardiovascular;  Laterality: Right;   A/V FISTULAGRAM Right 09/05/2021   Procedure: A/V Fistulagram;  Surgeon: CMarty Heck MD;  [facility];  Service: Cardiovascular;  Laterality: Right;   AV FISTULA  PLACEMENT     BACK SURGERY     Lumbar fusion L 4 and L 5   BIOPSY  01/09/2020   Procedure: BIOPSY;  Surgeon: Rush Landmark Telford Nab., MD;  Location: [place];  Service: Gastroenterology;;   BIOPSY  01/31/2021   Procedure: BIOPSY;  Surgeon: Irving Copas., MD;  Location: [place];  Service: Gastroenterology;;   BIOPSY  09/26/2021   Procedure: BIOPSY;  Surgeon: Thornton Park, MD;  Location: [place];  Service: Gastroenterology;;   COLONOSCOPY     COLONOSCOPY N/A 02/01/2021   Procedure: COLONOSCOPY;  Surgeon: Yetta Flock, MD;  Location: [place];  Service: Gastroenterology;  Laterality: N/A;   COLONOSCOPY WITH PROPOFOL N/A 09/26/2021   Procedure: COLONOSCOPY WITH PROPOFOL;  Surgeon: Thornton Park, MD;  Location: [place];  Service: Gastroenterology;  Laterality: N/A;   COLONOSCOPY WITH PROPOFOL N/A 09/29/2021   Procedure: COLONOSCOPY WITH PROPOFOL;  Surgeon: Thornton Park, MD;  Location: [place];  Service: Gastroenterology;  Laterality: N/A;   COLONOSCOPY WITH PROPOFOL N/A 10/01/2021   Procedure: COLONOSCOPY WITH PROPOFOL;  Surgeon: Mauri Pole, MD;  Location: [place];  Service: Gastroenterology;  Laterality: N/A;   ENDOSCOPIC MUCOSAL RESECTION N/A 01/09/2020   Procedure: ENDOSCOPIC MUCOSAL RESECTION;  Surgeon: Rush Landmark Telford Nab., MD;  Location: [place];  Service: Gastroenterology;  Laterality: N/A;   ENDOSCOPIC MUCOSAL RESECTION N/A 01/31/2021   Procedure: ENDOSCOPIC MUCOSAL RESECTION;  Surgeon: Rush Landmark Telford Nab., MD;  Location: [place];  Service: Gastroenterology;  Laterality: N/A;   ENTEROSCOPY N/A 09/26/2021   Procedure: ENTEROSCOPY;  Surgeon: Thornton Park, MD;  Location: [place];  Service: Gastroenterology;  Laterality: N/A;   ESOPHAGOGASTRODUODENOSCOPY     ESOPHAGOGASTRODUODENOSCOPY (EGD) WITH PROPOFOL N/A 01/09/2020   Procedure: ESOPHAGOGASTRODUODENOSCOPY (EGD) WITH PROPOFOL;  Surgeon: Rush Landmark Telford Nab., MD;  Location: [place];  Service: Gastroenterology;  Laterality: N/A;   ESOPHAGOGASTRODUODENOSCOPY (EGD) WITH PROPOFOL N/A 01/13/2020   Procedure: ESOPHAGOGASTRODUODENOSCOPY (EGD) WITH PROPOFOL;  Surgeon: Irene Shipper, MD;  Location: [place];  Service: Gastroenterology;  Laterality: N/A;   ESOPHAGOGASTRODUODENOSCOPY (EGD) WITH PROPOFOL N/A 01/31/2021   Procedure: ESOPHAGOGASTRODUODENOSCOPY (EGD) WITH PROPOFOL;  Surgeon: Rush Landmark Telford Nab., MD;  Location: [place];  Service: Gastroenterology;  Laterality: N/A;   EUS N/A 01/09/2020   Procedure: UPPER ENDOSCOPIC ULTRASOUND (EUS) RADIAL;  Surgeon: Irving Copas., MD;  Location: [place];  Service: Gastroenterology;  Laterality: N/A;   GIVENS CAPSULE STUDY  09/29/2021   Procedure: GIVENS CAPSULE STUDY;  Surgeon: Thornton Park, MD;  Location: [place];  Service: Gastroenterology;;   HEMOSTASIS CLIP PLACEMENT  01/09/2020    Procedure: HEMOSTASIS CLIP PLACEMENT;  Surgeon: Irving Copas., MD;  Location: [place];  Service: Gastroenterology;;   HEMOSTASIS CLIP PLACEMENT  01/31/2021   Procedure: HEMOSTASIS CLIP PLACEMENT;  Surgeon: Irving Copas., MD;  Location: [place];  Service: Gastroenterology;;   HEMOSTASIS CONTROL  01/13/2020   Procedure: HEMOSTASIS CONTROL;  Surgeon: Irene Shipper, MD;  Location: [place];  Service: Gastroenterology;;  hemaspray   HOT HEMOSTASIS N/A 01/13/2020   Procedure: HOT HEMOSTASIS (ARGON PLASMA COAGULATION/BICAP);  Surgeon: Irene Shipper, MD;  Location: [place];  Service: Gastroenterology;  Laterality: N/A;   HOT HEMOSTASIS N/A 02/01/2021   Procedure: HOT HEMOSTASIS (ARGON PLASMA COAGULATION/BICAP);  Surgeon: Yetta Flock, MD;  Location: [facility];  Service: Gastroenterology;  Laterality: N/A;   HOT HEMOSTASIS N/A 10/01/2021   Procedure: HOT HEMOSTASIS (ARGON PLASMA COAGULATION/BICAP);  Surgeon: Mauri Pole, MD;  Location: [place];  Service: Gastroenterology;  Laterality: N/A;   IR GENERIC HISTORICAL  07/09/2016   IR US GUIDE VASC ACCESS RIGHT 07/09/2016 Arne Cleveland, MD [facility]   IR GENERIC HISTORICAL  07/09/2016   IR FLUORO GUIDE CV LINE RIGHT 07/09/2016 Arne Cleveland, MD [facility]   KNEE ARTHROPLASTY Left    LAPAROSCOPIC SIGMOID COLECTOMY N/A 07/11/2016   Procedure: LAPAROSCOPIC SIGMOID COLECTOMY;  Surgeon: Clovis Riley, MD;  Location: [place];  Service: General;  Laterality: N/A;   LEFT ATRIAL APPENDAGE OCCLUSION N/A 02/06/2022   Procedure: LEFT ATRIAL APPENDAGE OCCLUSION;  Surgeon: Sherren Mocha, MD;  Location: [place] [facility];  Service: Cardiovascular;  Laterality: N/A;   MASS EXCISION Right 04/10/2020   Procedure: EXCISION SKIN NODULE RIGHT FOREARM;  Surgeon: Waynetta Sandy, MD;  Location: [place];  Service: Vascular;  Laterality: Right;   POLYPECTOMY  09/26/2021   Procedure: POLYPECTOMY;  Surgeon:  Thornton Park, MD;  Location: [place];  Service: Gastroenterology;;   POLYPECTOMY  10/01/2021   Procedure: POLYPECTOMY;  Surgeon: Mauri Pole, MD;  Location: [place];  Service: Gastroenterology;;   Clide Deutscher  01/09/2020   Procedure: Clide Deutscher;  Surgeon: Irving Copas., MD;  Location: [place];  Service: Gastroenterology;;   Clide Deutscher  01/13/2020   Procedure: Clide Deutscher;  Surgeon: Irene Shipper, MD;  Location: [place];  Service: Gastroenterology;;   Maryagnes Amos INJECTION  01/09/2020   Procedure: SUBMUCOSAL LIFTING INJECTION;  Surgeon: Irving Copas., MD;  Location: [place];  Service: Gastroenterology;;   TEE WITHOUT CARDIOVERSION N/A 02/06/2022   Procedure: TRANSESOPHAGEAL ECHOCARDIOGRAM (TEE);  Surgeon: Sherren Mocha, MD;  Location: [place] [facility];  Service: Cardiovascular;  Laterality: N/A;   TUBAL LIGATION      Current Medications: No outpatient medications have been marked as taking for the 03/05/22 encounter (Appointment) with [facility].     Allergies:   Latex, Penicillins, Sulfa antibiotics, Tape, and Reglan [metoclopramide]   Social History   Socioeconomic History   Marital status: Divorced    Spouse name: Not on file   Number of children: 4   Years of education: 14   Highest education level: Associate degree: occupational, Hotel manager, or vocational program  Occupational History   Occupation: Retired  Tobacco Use   Smoking status: Never    Passive exposure: Never   Smokeless tobacco: Never  Vaping Use   Vaping Use: Never used  Substance and Sexual Activity   Alcohol use: No   Drug use: No   Sexual activity: Never  Other Topics Concern   Not on file  Social History Narrative   HH 1   Divorced   Outpatient dialysis Mon, Wed, Fri   4 children: 1 daughter locally is an Designer, multimedia and 3 sons in [place] Determinants of Health   Financial Resource Strain: High  Risk (08/21/2021)   Overall Financial Resource Strain (CARDIA)    Difficulty of Paying Living Expenses: Very hard  Food Insecurity: Food Insecurity Present (08/21/2021)   Hunger Vital Sign    Worried About [place] in the Last Year: Often true    [place] in the Last Year: Often true  Transportation Needs: Unmet Transportation Needs (08/21/2021)   PRAPARE - Hydrologist (Medical): Yes    Lack of Transportation (Non-Medical): No  Physical Activity: Inactive (08/06/2021)   Exercise Vital Sign    Days of Exercise per Week: 0 days    Minutes of Exercise per  Session: 0 min  Stress: Stress Concern Present (08/21/2021)   [place]    Feeling of Stress : Very much  Social Connections: Moderately Isolated (07/31/2020)   Social Connection and Isolation Panel [NHANES]    Frequency of Communication with Friends and Family: More than three times a week    Frequency of Social Gatherings with Friends and Family: Never    Attends Religious Services: More than 4 times per year    Active Member of [employer] or Organizations: No    Attends Archivist Meetings: Never    Marital Status: Divorced     Family History: The patient's family history includes Heart failure in her mother; Other in her father; Stroke in her mother. There is no history of Colon cancer, Liver disease, Esophageal cancer, Stomach cancer, Inflammatory bowel disease, Rectal cancer, or Pancreatic cancer.  ROS:   Please see the history of present illness.    All other systems reviewed and are negative.  EKGs/Labs/Other Studies Reviewed:    The following studies were reviewed today:  02/06/22 Conclusion  Successful implantation of a 27 mm watchman flex device under fluoroscopic and transesophageal echo guidance  Recommendations: 1.  Continue warfarin x6 weeks, take warfarin today as directed 2.  Aspirin 81 mg daily  through 6 months 3.  If criteria met at 6-week TEE, discontinue warfarin and start clopidogrel 75 mg daily    EKG:  EKG is NOT ordered today.  Recent Labs: 09/25/2021: ALT 14 02/03/2022: Platelets 269 02/06/2022: BUN 33; Creatinine, Ser 9.30; Hemoglobin 15.0; Potassium 4.2; Sodium 137  Recent Lipid Panel    Component Value Date/Time   CHOL 198 10/06/2019 1510   TRIG 51 10/06/2019 1510   HDL 53 10/06/2019 1510   CHOLHDL 3.7 10/06/2019 1510   VLDL 10 10/06/2019 1510   LDLCALC 135 (H) 10/06/2019 1510     Risk Assessment/Calculations:    CHA2DS2-VASc Score = 5  {Confirm score is correct.  If not, click here to update score.  REFRESH note.  :1} This indicates a 7.2% annual risk of stroke. The patient's score is based upon: CHF History: 0 HTN History: 1 Diabetes History: 0 Stroke History: 0 Vascular Disease History: 1 Age Score: 2 Gender Score: 1   {This patient has a significant risk of stroke if diagnosed with atrial fibrillation.  Please consider VKA or DOAC agent for anticoagulation if the bleeding risk is acceptable.   You can also use the SmartPhrase .Tyro for documentation.   :[id number]}   Physical Exam:    VS:  There were no vitals taken for this visit.    Wt Readings from Last 3 Encounters:  02/28/22 200 lb 6.4 oz (90.9 kg)  02/06/22 190 lb (86.2 kg)  02/03/22 196 lb 3.2 oz (89 kg)     GEN: *** Well nourished, well developed in no acute distress HEENT: Normal NECK: No JVD LYMPHATICS: No lymphadenopathy CARDIAC: ***RRR, no murmurs, rubs, gallops RESPIRATORY:  Clear to auscultation without rales, wheezing or rhonchi  ABDOMEN: Soft, non-tender, non-distended MUSCULOSKELETAL:  No edema; No deformity  SKIN: Warm and dry NEUROLOGIC:  Alert and oriented x 3 PSYCHIATRIC:  Normal affect   ASSESSMENT:    1. Presence of Watchman left atrial appendage closure device   2. Paroxysmal atrial fibrillation (HCC)   3. History of GI bleed    PLAN:    In order  of problems listed above:  PAF with GI bleeding s/p LAAO with Watchman: continue  coumadin and aspirin until 03/21/22 when she can be concerted to DAPT until Jan 2024 when she will convert to aspirin destination therapy.      Medication Adjustments/Labs and Tests Ordered: Current medicines are reviewed at length with the patient today.  Concerns regarding medicines are outlined above.  No orders of the defined types were placed in this encounter.  No orders of the defined types were placed in this encounter.   There are no Patient Instructions on file for this visit.   Signed, Angelena Form, PA-C  03/05/2022 12:13 PM    [facility]

## 2022-03-06 ENCOUNTER — Ambulatory Visit: Payer: Medicare Other | Admitting: Cardiology

## 2022-03-06 DIAGNOSIS — I6523 Occlusion and stenosis of bilateral carotid arteries: Secondary | ICD-10-CM | POA: Diagnosis not present

## 2022-03-06 DIAGNOSIS — I480 Paroxysmal atrial fibrillation: Secondary | ICD-10-CM | POA: Diagnosis not present

## 2022-03-06 DIAGNOSIS — Z5181 Encounter for therapeutic drug level monitoring: Secondary | ICD-10-CM | POA: Diagnosis not present

## 2022-03-06 DIAGNOSIS — Z7901 Long term (current) use of anticoagulants: Secondary | ICD-10-CM | POA: Diagnosis not present

## 2022-03-06 LAB — POCT INR: INR: 2.9 (ref 2.0–3.0)

## 2022-03-07 DIAGNOSIS — D509 Iron deficiency anemia, unspecified: Secondary | ICD-10-CM | POA: Diagnosis not present

## 2022-03-07 DIAGNOSIS — D631 Anemia in chronic kidney disease: Secondary | ICD-10-CM | POA: Diagnosis not present

## 2022-03-07 DIAGNOSIS — N2581 Secondary hyperparathyroidism of renal origin: Secondary | ICD-10-CM | POA: Diagnosis not present

## 2022-03-07 DIAGNOSIS — N186 End stage renal disease: Secondary | ICD-10-CM | POA: Diagnosis not present

## 2022-03-07 DIAGNOSIS — Z992 Dependence on renal dialysis: Secondary | ICD-10-CM | POA: Diagnosis not present

## 2022-03-09 NOTE — Progress Notes (Unsigned)
Lab Results  Component Value Date   INR 2.9 03/06/2022   INR 2.7 02/11/2022   INR 1.5 (H) 02/06/2022     Anticoagulation Summary  As of 03/06/2022    INR goal:  2.0-3.0  TTR:  --  INR used for dosing:  2.9 (03/06/2022)  Warfarin maintenance plan:  5 mg (5 mg x 1) every day  Weekly warfarin total:  35 mg  Plan last modified:  Mares, Lesley (03/10/2022)  Next INR check:  04/04/2022  Target end date:          Anticoagulation Episode Summary     INR check location:     Preferred lab:     Send INR reminders to:     Comments:        Description   INR is at goal level: 2.5-3.5 Pt will continue '5mg'$  daily with an ASA. Pt mention that she has an appt with Dr. Burt Knack in the next few weeks. He will take her off of her Warfarin.

## 2022-03-10 DIAGNOSIS — D631 Anemia in chronic kidney disease: Secondary | ICD-10-CM | POA: Diagnosis not present

## 2022-03-10 DIAGNOSIS — D509 Iron deficiency anemia, unspecified: Secondary | ICD-10-CM | POA: Diagnosis not present

## 2022-03-10 DIAGNOSIS — N186 End stage renal disease: Secondary | ICD-10-CM | POA: Diagnosis not present

## 2022-03-10 DIAGNOSIS — Z992 Dependence on renal dialysis: Secondary | ICD-10-CM | POA: Diagnosis not present

## 2022-03-10 DIAGNOSIS — N2581 Secondary hyperparathyroidism of renal origin: Secondary | ICD-10-CM | POA: Diagnosis not present

## 2022-03-11 ENCOUNTER — Telehealth: Payer: Self-pay

## 2022-03-11 NOTE — Telephone Encounter (Signed)
Has appt with Dr. Sarajane Jews Thursday.

## 2022-03-11 NOTE — Telephone Encounter (Signed)
Caller's BP has been low and would like to make an appt. The dialysis center has been working with her but what they are doing is not working. She fainted yesterday again during dialysis. 75/30 this am. Now it is everyday. Pharmacist called her Carvedilol off days, Midodrine on dialysis days. Planning to speak to kidney doctor tomorrow.  03/11/2022 3:48:12 PM Go to ED Now (or PCP triage) [name], RN, [place]  Comments User: Erik Obey, RN Date/Time Jane [name] Time): 03/11/2022 3:42:31 PM Currently in bed and feels okay right now. But a few minutes ago was feeling low. Drinking fluids BP device is connected to cardiology but they aren't monitoring it anymore.  User: Erik Obey, RN Date/Time Jane [name] Time): 03/11/2022 3:43:20 PM 128/95 right now. When gets up it drops  User: Erik Obey, RN Date/Time Jane [name] Time): 03/11/2022 3:44:55 PM Comes and goes, so cooked lunch/dinner and ate early, brings stuff with her to snack throughout the day. Has pills and water at bedside.  User: Erik Obey, RN Date/Time Jane [name] Time): 03/11/2022 3:47:13 PM 02/06/22 Watchman placement, Dr Burt Knack, to get off coumadin  Referrals GO TO FACILITY REFUSED REFERRED TO PCP OFFICE Warm transfer to backline

## 2022-03-12 ENCOUNTER — Ambulatory Visit: Payer: Medicare Other

## 2022-03-12 DIAGNOSIS — D509 Iron deficiency anemia, unspecified: Secondary | ICD-10-CM | POA: Diagnosis not present

## 2022-03-12 DIAGNOSIS — D631 Anemia in chronic kidney disease: Secondary | ICD-10-CM | POA: Diagnosis not present

## 2022-03-12 DIAGNOSIS — N186 End stage renal disease: Secondary | ICD-10-CM | POA: Diagnosis not present

## 2022-03-12 DIAGNOSIS — N2581 Secondary hyperparathyroidism of renal origin: Secondary | ICD-10-CM | POA: Diagnosis not present

## 2022-03-12 DIAGNOSIS — Z992 Dependence on renal dialysis: Secondary | ICD-10-CM | POA: Diagnosis not present

## 2022-03-13 ENCOUNTER — Ambulatory Visit (INDEPENDENT_AMBULATORY_CARE_PROVIDER_SITE_OTHER): Payer: Medicare Other | Admitting: Family Medicine

## 2022-03-13 ENCOUNTER — Encounter: Payer: Self-pay | Admitting: Family Medicine

## 2022-03-13 VITALS — BP 118/60 | HR 93 | Temp 98.7°F | Wt 188.0 lb

## 2022-03-13 DIAGNOSIS — I129 Hypertensive chronic kidney disease with stage 1 through stage 4 chronic kidney disease, or unspecified chronic kidney disease: Secondary | ICD-10-CM

## 2022-03-13 DIAGNOSIS — I480 Paroxysmal atrial fibrillation: Secondary | ICD-10-CM | POA: Diagnosis not present

## 2022-03-13 DIAGNOSIS — I6523 Occlusion and stenosis of bilateral carotid arteries: Secondary | ICD-10-CM

## 2022-03-13 NOTE — Progress Notes (Signed)
   Subjective:    [name], female    DOB: 1946/07/10, 76 y.o.   MRN: [id number]  HPI Here for advice about her BP medications. Apparently she has very labile BP which tend to go high except on dialysis days (Mon,Wed, Fri) when it drops low. Anytime her systolic pressure drops below 110 or so she gets lightheaded, and she has passed out several times at home. Fortunately she has not been injured so far. She takes Midodrine 10 mg up to 3 times a day as needed for BP drops on dialysis days. She has also been taking Carvedilol for some time, but there has been confusion about what dose she should take. When she saw Dr. Adrian Prows, her cardiologist, a few weeks ago he told her to take Carvedilol 25 mg BID. Then when she saw Dr. Lawson Radar, her nephrologist, last week he told her to take Carvedilol 12.5 mg BID. However when she does this her BP still drops too low. Today her BP is still on the low side. She has not been in atrial fibrillation for some time.    Review of Systems  Constitutional: Negative.   Respiratory: Negative.    Cardiovascular: Negative.   Neurological:  Positive for light-headedness. Negative for headaches.       Objective:   Physical Exam Constitutional:      Appearance: Normal appearance.  Cardiovascular:     Rate and Rhythm: Normal rate and regular rhythm.     Pulses: Normal pulses.     Heart sounds: Normal heart sounds.  Pulmonary:     Effort: Pulmonary effort is normal.     Breath sounds: Normal breath sounds.  Neurological:     General: No focal deficit present.     Mental Status: She is alert and oriented to person, place, and time.           Assessment & Plan:  Labile HTN. I advised her to decrease the Carvedilol to 6.25 mg BID and to only take this on non-dialysis days. She will still take the Midodrine on dialysis days. She knows to hold the Carvedilol dose if her systolic pressure drops below 110. Follow up with Dr. [name], her PCP,  next week.  [name], MD

## 2022-03-14 DIAGNOSIS — N186 End stage renal disease: Secondary | ICD-10-CM | POA: Diagnosis not present

## 2022-03-14 DIAGNOSIS — Z992 Dependence on renal dialysis: Secondary | ICD-10-CM | POA: Diagnosis not present

## 2022-03-14 DIAGNOSIS — D631 Anemia in chronic kidney disease: Secondary | ICD-10-CM | POA: Diagnosis not present

## 2022-03-14 DIAGNOSIS — D509 Iron deficiency anemia, unspecified: Secondary | ICD-10-CM | POA: Diagnosis not present

## 2022-03-14 DIAGNOSIS — N2581 Secondary hyperparathyroidism of renal origin: Secondary | ICD-10-CM | POA: Diagnosis not present

## 2022-03-17 DIAGNOSIS — D509 Iron deficiency anemia, unspecified: Secondary | ICD-10-CM | POA: Diagnosis not present

## 2022-03-17 DIAGNOSIS — Z992 Dependence on renal dialysis: Secondary | ICD-10-CM | POA: Diagnosis not present

## 2022-03-17 DIAGNOSIS — N2581 Secondary hyperparathyroidism of renal origin: Secondary | ICD-10-CM | POA: Diagnosis not present

## 2022-03-17 DIAGNOSIS — N186 End stage renal disease: Secondary | ICD-10-CM | POA: Diagnosis not present

## 2022-03-17 DIAGNOSIS — D631 Anemia in chronic kidney disease: Secondary | ICD-10-CM | POA: Diagnosis not present

## 2022-03-18 NOTE — Progress Notes (Unsigned)
[facility] [place]                                     Cardiology Office Note:    Date:  03/20/2022   ID:  Jane [name], DOB January 26, 1946, MRN [id number]  PCP:  [name], [name], MD  [facility] Cardiologist:  [facility] Electrophysiologist:  None   Referring MD: [name], [name], MD   Follow up s/p LAAO - set up 60 day CT  History of Present Illness:    Jane [name] is a 76 y.o. female with a hx of labile HTN, ESRD on HD, anemia, carotid disease, OSA, GERD, recurrent GI bleeding and permanent afib on Coumadin s/p LAAO on 02/06/22 who presents to clinic for follow up.  She was referred to Dr. Burt Knack from Dr. Einar Gip for the evaluation of atrial fibrillation and long term stroke prevention due to issues with GI bleeding while on Coumadin. She has known permanent atrial fibrillation for many years and has been anticoagulated with warfarin due to ESRD on HD. In review of outpatient notes, it appears that she had a cardioversion remotely in 2010 and was maintained on amiodarone at some point. She unfortunately has had a lot of problems with recurrent GI bleeding from the large bowel.  She has had colonoscopy procedures and capsule endoscopy and was re-started back on warfarin.  She underwent successful LAAO with a 27 mm Watchman Flex device on 02/06/22 by Dr. Burt Knack.   Today the patient presents to clinic for follow up. No CP or SOB. No LE edema, orthopnea or PND. She has had dizziness and one recent episode of syncope when her BP got too low at HD. She has had her BP meds adjusted and been started on midodrine with improvement. No blood in stool or urine. No palpitations.   Past Medical History:  Diagnosis Date   Acid reflux    Anemia of chronic disease    Arthritis    Asthma    Atrial fibrillation (HCC)    AVM (arteriovenous malformation)    Bilateral carotid bruits    Complication of anesthesia    "hard to wake up, I have sleep  apnea" no CPAP   Diverticulitis    Duodenal ulcer    Dysrhythmia    Afib   ESRD (end stage renal disease) ([place])    MWF [place]   Gallbladder sludge    GI bleed    Headache    Heart murmur    Hemorrhoids    History of blood transfusion    Hypertension    Malaise and fatigue    Orthostatic hypotension    PAF (paroxysmal atrial fibrillation) ([place])    Presence of Watchman left atrial appendage closure device 02/06/2022   Watchman FLX 14mm with Dr. Burt Knack   Shortness of breath    " when I walk to fast"   Sleep apnea    Syncope    Tubulovillous adenoma of colon     Past Surgical History:  Procedure Laterality Date   A/V FISTULAGRAM Right 10/18/2020   Procedure: A/V FISTULAGRAM;  Surgeon: Marty Heck, MD;  Location: [place] [facility];  Service: Cardiovascular;  Laterality: Right;   A/V FISTULAGRAM Right 09/05/2021   Procedure: A/V Fistulagram;  Surgeon: Marty Heck, MD;  Location: [place] [facility];  Service: Cardiovascular;  Laterality:  Right;   AV FISTULA PLACEMENT     BACK SURGERY     Lumbar fusion L 4 and L 5   BIOPSY  01/09/2020   Procedure: BIOPSY;  Surgeon: Rush Landmark Telford Nab., MD;  Location: [place];  Service: Gastroenterology;;   BIOPSY  01/31/2021   Procedure: BIOPSY;  Surgeon: Irving Copas., MD;  Location: [place];  Service: Gastroenterology;;   BIOPSY  09/26/2021   Procedure: BIOPSY;  Surgeon: Thornton Park, MD;  Location: [place];  Service: Gastroenterology;;   COLONOSCOPY     COLONOSCOPY N/A 02/01/2021   Procedure: COLONOSCOPY;  Surgeon: Yetta Flock, MD;  Location: [place];  Service: Gastroenterology;  Laterality: N/A;   COLONOSCOPY WITH PROPOFOL N/A 09/26/2021   Procedure: COLONOSCOPY WITH PROPOFOL;  Surgeon: Thornton Park, MD;  Location: [place];  Service: Gastroenterology;  Laterality: N/A;   COLONOSCOPY WITH PROPOFOL N/A 09/29/2021   Procedure: COLONOSCOPY WITH PROPOFOL;  Surgeon:  Thornton Park, MD;  Location: [place];  Service: Gastroenterology;  Laterality: N/A;   COLONOSCOPY WITH PROPOFOL N/A 10/01/2021   Procedure: COLONOSCOPY WITH PROPOFOL;  Surgeon: Mauri Pole, MD;  Location: [place];  Service: Gastroenterology;  Laterality: N/A;   ENDOSCOPIC MUCOSAL RESECTION N/A 01/09/2020   Procedure: ENDOSCOPIC MUCOSAL RESECTION;  Surgeon: Rush Landmark Telford Nab., MD;  Location: [place];  Service: Gastroenterology;  Laterality: N/A;   ENDOSCOPIC MUCOSAL RESECTION N/A 01/31/2021   Procedure: ENDOSCOPIC MUCOSAL RESECTION;  Surgeon: Rush Landmark Telford Nab., MD;  Location: [place];  Service: Gastroenterology;  Laterality: N/A;   ENTEROSCOPY N/A 09/26/2021   Procedure: ENTEROSCOPY;  Surgeon: Thornton Park, MD;  Location: [place];  Service: Gastroenterology;  Laterality: N/A;   ESOPHAGOGASTRODUODENOSCOPY     ESOPHAGOGASTRODUODENOSCOPY (EGD) WITH PROPOFOL N/A 01/09/2020   Procedure: ESOPHAGOGASTRODUODENOSCOPY (EGD) WITH PROPOFOL;  Surgeon: Rush Landmark Telford Nab., MD;  Location: [place];  Service: Gastroenterology;  Laterality: N/A;   ESOPHAGOGASTRODUODENOSCOPY (EGD) WITH PROPOFOL N/A 01/13/2020   Procedure: ESOPHAGOGASTRODUODENOSCOPY (EGD) WITH PROPOFOL;  Surgeon: Irene Shipper, MD;  Location: [place];  Service: Gastroenterology;  Laterality: N/A;   ESOPHAGOGASTRODUODENOSCOPY (EGD) WITH PROPOFOL N/A 01/31/2021   Procedure: ESOPHAGOGASTRODUODENOSCOPY (EGD) WITH PROPOFOL;  Surgeon: Rush Landmark Telford Nab., MD;  Location: [place];  Service: Gastroenterology;  Laterality: N/A;   EUS N/A 01/09/2020   Procedure: UPPER ENDOSCOPIC ULTRASOUND (EUS) RADIAL;  Surgeon: Irving Copas., MD;  Location: [place];  Service: Gastroenterology;  Laterality: N/A;   GIVENS CAPSULE STUDY  09/29/2021   Procedure: GIVENS CAPSULE STUDY;  Surgeon: Thornton Park, MD;  Location: [place];  Service: Gastroenterology;;   HEMOSTASIS CLIP PLACEMENT   01/09/2020   Procedure: HEMOSTASIS CLIP PLACEMENT;  Surgeon: Irving Copas., MD;  Location: [place];  Service: Gastroenterology;;   HEMOSTASIS CLIP PLACEMENT  01/31/2021   Procedure: HEMOSTASIS CLIP PLACEMENT;  Surgeon: Irving Copas., MD;  Location: [place];  Service: Gastroenterology;;   HEMOSTASIS CONTROL  01/13/2020   Procedure: HEMOSTASIS CONTROL;  Surgeon: Irene Shipper, MD;  Location: [place];  Service: Gastroenterology;;  hemaspray   HOT HEMOSTASIS N/A 01/13/2020   Procedure: HOT HEMOSTASIS (ARGON PLASMA COAGULATION/BICAP);  Surgeon: Irene Shipper, MD;  Location: [place];  Service: Gastroenterology;  Laterality: N/A;   HOT HEMOSTASIS N/A 02/01/2021   Procedure: HOT HEMOSTASIS (ARGON PLASMA COAGULATION/BICAP);  Surgeon: Yetta Flock, MD;  Location: [facility] ENDOSCOPY;  Service: Gastroenterology;  Laterality: N/A;   HOT HEMOSTASIS N/A 10/01/2021   Procedure: HOT HEMOSTASIS (ARGON PLASMA COAGULATION/BICAP);  Surgeon: Mauri Pole, MD;  Location: [facility]  ENDOSCOPY;  Service: Gastroenterology;  Laterality: N/A;   IR GENERIC HISTORICAL  07/09/2016   IR US GUIDE VASC ACCESS RIGHT 07/09/2016 Arne Cleveland, MD [facility]   IR GENERIC HISTORICAL  07/09/2016   IR FLUORO GUIDE CV LINE RIGHT 07/09/2016 Arne Cleveland, MD [facility]   KNEE ARTHROPLASTY Left    LAPAROSCOPIC SIGMOID COLECTOMY N/A 07/11/2016   Procedure: LAPAROSCOPIC SIGMOID COLECTOMY;  Surgeon: Clovis Riley, MD;  Location: [place];  Service: General;  Laterality: N/A;   LEFT ATRIAL APPENDAGE OCCLUSION N/A 02/06/2022   Procedure: LEFT ATRIAL APPENDAGE OCCLUSION;  Surgeon: Sherren Mocha, MD;  Location: [place] [facility];  Service: Cardiovascular;  Laterality: N/A;   MASS EXCISION Right 04/10/2020   Procedure: EXCISION SKIN NODULE RIGHT FOREARM;  Surgeon: Waynetta Sandy, MD;  Location: [place];  Service: Vascular;  Laterality: Right;   POLYPECTOMY  09/26/2021   Procedure: POLYPECTOMY;   Surgeon: Thornton Park, MD;  Location: [place];  Service: Gastroenterology;;   POLYPECTOMY  10/01/2021   Procedure: POLYPECTOMY;  Surgeon: Mauri Pole, MD;  Location: [place];  Service: Gastroenterology;;   Clide Deutscher  01/09/2020   Procedure: Clide Deutscher;  Surgeon: Irving Copas., MD;  Location: [place];  Service: Gastroenterology;;   Clide Deutscher  01/13/2020   Procedure: Clide Deutscher;  Surgeon: Irene Shipper, MD;  Location: [place];  Service: Gastroenterology;;   Maryagnes Amos INJECTION  01/09/2020   Procedure: SUBMUCOSAL LIFTING INJECTION;  Surgeon: Irving Copas., MD;  Location: [place];  Service: Gastroenterology;;   TEE WITHOUT CARDIOVERSION N/A 02/06/2022   Procedure: TRANSESOPHAGEAL ECHOCARDIOGRAM (TEE);  Surgeon: Sherren Mocha, MD;  Location: [place] [facility];  Service: Cardiovascular;  Laterality: N/A;   TUBAL LIGATION      Current Medications: Current Meds  Medication Sig   aspirin EC 81 MG tablet Take 1 tablet (81 mg total) by mouth daily. Swallow whole.   atorvastatin (LIPITOR) 10 MG tablet TAKE ONE TABLET BY MOUTH ONCE DAILY   B Complex-C-Zn-Folic Acid (DIALYVITE 962 WITH ZINC) 0.8 MG TABS Take 1 tablet by mouth at bedtime.   CALCITRIOL PO Take 2 tablets by mouth every Monday, Wednesday, and Friday with hemodialysis.   carvedilol (COREG) 6.25 MG tablet Take 1 tablet (6.25 mg total) by mouth 2 (two) times daily.   cinacalcet (SENSIPAR) 30 MG tablet Take 30 mg by mouth every Monday, Wednesday, and Friday with hemodialysis. Post dialysis   clopidogrel (PLAVIX) 75 MG tablet Take 1 tablet (75 mg total) by mouth daily.   diltiazem (CARDIZEM) 60 MG tablet Take 1 tablet (60 mg total) by mouth 3 (three) times daily as needed (Atrial fibrillation).   ethyl chloride spray Apply 1 application topically every Monday, Wednesday, and Friday with hemodialysis.   famotidine (PEPCID) 20 MG tablet TAKE ONE TABLET BY MOUTH EVERYDAY  AT BEDTIME   linaclotide (LINZESS) 72 MCG capsule Take 1 capsule (72 mcg total) by mouth daily before breakfast.   Methoxy PEG-Epoetin Beta (MIRCERA IJ) Inject 1 Dose into the vein every Monday, Wednesday, and Friday with hemodialysis.   midodrine (PROAMATINE) 10 MG tablet TAKE ONE TABLET three times daily AS NEEDED FOR low blood pressure AND dizziness AND lightheadedness)   pantoprazole (PROTONIX) 40 MG tablet Take 1 tablet (40 mg total) by mouth 2 (two) times daily.   sevelamer carbonate (RENVELA) 800 MG tablet Take 1,600 mg by mouth in the morning and at bedtime. With meals take 800 mg with snack   [DISCONTINUED] warfarin (COUMADIN) 5 MG tablet Take 1  tablet (5 mg total) by mouth daily. Or as directed by coumadin clinic (Patient taking differently: Take 5 mg by mouth at bedtime. Or as directed by coumadin clinic)     Allergies:   Latex, Penicillins, Sulfa antibiotics, Tape, and Reglan [metoclopramide]   Social History   Socioeconomic History   Marital status: Divorced    Spouse name: Not on file   Number of children: 4   Years of education: 14   Highest education level: Associate degree: occupational, Hotel manager, or vocational program  Occupational History   Occupation: Retired  Tobacco Use   Smoking status: Never    Passive exposure: Never   Smokeless tobacco: Never  Vaping Use   Vaping Use: Never used  Substance and Sexual Activity   Alcohol use: No   Drug use: No   Sexual activity: Never  Other Topics Concern   Not on file  Social History Narrative   HH 1   Divorced   Outpatient dialysis Mon, Wed, Fri   4 children: 1 daughter locally is an Designer, multimedia and 3 sons in [place] Determinants of Health   Financial Resource Strain: High Risk (08/21/2021)   Overall Financial Resource Strain (CARDIA)    Difficulty of Paying Living Expenses: Very hard  Food Insecurity: Food Insecurity Present (08/21/2021)   Hunger Vital Sign    Worried About [place] in the  Last Year: Often true    [place] in the Last Year: Often true  Transportation Needs: Unmet Transportation Needs (08/21/2021)   PRAPARE - Hydrologist (Medical): Yes    Lack of Transportation (Non-Medical): No  Physical Activity: Inactive (08/06/2021)   Exercise Vital Sign    Days of Exercise per Week: 0 days    Minutes of Exercise per Session: 0 min  Stress: Stress Concern Present (08/21/2021)   [place]    Feeling of Stress : Very much  Social Connections: Moderately Isolated (07/31/2020)   Social Connection and Isolation Panel [NHANES]    Frequency of Communication with Friends and Family: More than three times a week    Frequency of Social Gatherings with Friends and Family: Never    Attends Religious Services: More than 4 times per year    Active Member of [employer] or Organizations: No    Attends Archivist Meetings: Never    Marital Status: Divorced     Family History: The patient's family history includes Heart failure in her mother; Other in her father; Stroke in her mother. There is no history of Colon cancer, Liver disease, Esophageal cancer, Stomach cancer, Inflammatory bowel disease, Rectal cancer, or Pancreatic cancer.  ROS:   Please see the history of present illness.    All other systems reviewed and are negative.  EKGs/Labs/Other Studies Reviewed:    The following studies were reviewed today:  02/06/22 LEFT ATRIAL APPENDAGE OCCLUSION  Indications Paroxysmal atrial fibrillation (HCC) [I48.0 (ICD-10-CM)]  Atrial fibrillation, unspecified type ([place]) [I48.91 (ICD-10-CM)]   Conclusion  Successful implantation of a 27 mm watchman flex device under fluoroscopic and transesophageal echo guidance  Recommendations: 1.  Continue warfarin x6 weeks, take warfarin today as directed 2.  Aspirin 81 mg daily through 6 months 3.  If criteria met at 6-week TEE,  discontinue warfarin and start clopidogrel 75 mg daily    EKG:  EKG is NOT ordered today.   Recent Labs: 09/25/2021: ALT 14 02/03/2022:  Platelets 269 02/06/2022: Hemoglobin 15.0 03/19/2022: BUN 23; Creatinine, Ser 5.86; Potassium 4.2; Sodium 140  Recent Lipid Panel    Component Value Date/Time   CHOL 198 10/06/2019 1510   TRIG 51 10/06/2019 1510   HDL 53 10/06/2019 1510   CHOLHDL 3.7 10/06/2019 1510   VLDL 10 10/06/2019 1510   LDLCALC 135 (H) 10/06/2019 1510     Risk Assessment/Calculations:    CHA2DS2-VASc Score = 5   This indicates a 7.2% annual risk of stroke. The patient's score is based upon: CHF History: 0 HTN History: 1 Diabetes History: 0 Stroke History: 0 Vascular Disease History: 1 Age Score: 2 Gender Score: 1      Physical Exam:    VS:  BP (!) 142/68   Pulse 61   Ht $R'5\' 5"'pR$  (1.651 m)   Wt 197 lb (89.4 kg)   SpO2 98%   BMI 32.78 kg/m     Wt Readings from Last 3 Encounters:  03/19/22 197 lb (89.4 kg)  03/13/22 188 lb (85.3 kg)  02/28/22 200 lb 6.4 oz (90.9 kg)     GEN:  Well nourished, well developed in no acute distress HEENT: Normal NECK: No JVD LYMPHATICS: No lymphadenopathy CARDIAC: RRR, soft murmur. No rubs, gallops RESPIRATORY:  Clear to auscultation without rales, wheezing or rhonchi  ABDOMEN: Soft, non-tender, non-distended MUSCULOSKELETAL:  No edema; No deformity  SKIN: Warm and dry NEUROLOGIC:  Alert and oriented x 3 PSYCHIATRIC:  Normal affect   ASSESSMENT:    1. Presence of Watchman left atrial appendage closure device   2. Longstanding persistent atrial fibrillation ([place])   3. History of lower GI bleeding    PLAN:    In order of problems listed above:  Permanent afib with recurrent GI bleeding: stop coumadin now. Start aspirin and Plavix through 6 months post implant then continue aspirin alone. 60 day CT scheduled for 04/10/22.     Medication Adjustments/Labs and Tests Ordered: Current medicines are reviewed at length with  the patient today.  Concerns regarding medicines are outlined above.  Orders Placed This Encounter  Procedures   Basic metabolic panel   Meds ordered this encounter  Medications   clopidogrel (PLAVIX) 75 MG tablet    Sig: Take 1 tablet (75 mg total) by mouth daily.    Dispense:  90 tablet    Refill:  1    Patient Instructions  Medication Instructions:  Your physician has recommended you make the following change in your medication:  STOP COUMADIN  START PLAVIX 75 MG DAILY  TAKE ASPIRIN 81 MG DAILY *If you need a refill on your cardiac medications before your next appointment, please call your pharmacy*   Lab Work: TODAY: BMET If you have labs (blood work) drawn today and your tests are completely normal, you will receive your results only by: [place] (if you have MyChart) OR A paper copy in the mail If you have any lab test that is abnormal or we need to change your treatment, we will call you to review the results.   Testing/Procedures: SEE INSTRUCTION LETTER   Follow-Up: At [facility], you and your health needs are our priority.  As part of our continuing mission to provide you with exceptional heart care, we have created designated Provider Care Teams.  These Care Teams include your primary Cardiologist (physician) and Advanced Practice Providers (APPs -  Physician Assistants and Nurse Practitioners) who all work together to provide you with the care you need, when you need it.  We recommend signing up for the patient portal called "MyChart".  Sign up information is provided on this After Visit Summary.  MyChart is used to connect with patients for Virtual Visits (Telemedicine).  Patients are able to view lab/test results, encounter notes, upcoming appointments, etc.  Non-urgent messages can be sent to your provider as well.   To learn more about what you can do with MyChart, go to [email or web address].    Your next appointment:   SOMEONE WITH THE  STRUCTURAL TEAM WILL CALL YOU  Important Information About Sugar         Signed, Angelena Form, PA-C  03/20/2022 10:30 AM    [facility]

## 2022-03-19 ENCOUNTER — Ambulatory Visit: Payer: Medicare Other | Attending: Cardiology | Admitting: Physician Assistant

## 2022-03-19 VITALS — BP 142/68 | HR 61 | Ht 65.0 in | Wt 197.0 lb

## 2022-03-19 DIAGNOSIS — I4811 Longstanding persistent atrial fibrillation: Secondary | ICD-10-CM

## 2022-03-19 DIAGNOSIS — I6523 Occlusion and stenosis of bilateral carotid arteries: Secondary | ICD-10-CM | POA: Diagnosis not present

## 2022-03-19 DIAGNOSIS — Z8719 Personal history of other diseases of the digestive system: Secondary | ICD-10-CM | POA: Diagnosis not present

## 2022-03-19 DIAGNOSIS — D509 Iron deficiency anemia, unspecified: Secondary | ICD-10-CM | POA: Diagnosis not present

## 2022-03-19 DIAGNOSIS — D631 Anemia in chronic kidney disease: Secondary | ICD-10-CM | POA: Diagnosis not present

## 2022-03-19 DIAGNOSIS — Z95818 Presence of other cardiac implants and grafts: Secondary | ICD-10-CM

## 2022-03-19 DIAGNOSIS — N186 End stage renal disease: Secondary | ICD-10-CM | POA: Diagnosis not present

## 2022-03-19 DIAGNOSIS — N2581 Secondary hyperparathyroidism of renal origin: Secondary | ICD-10-CM | POA: Diagnosis not present

## 2022-03-19 DIAGNOSIS — Z992 Dependence on renal dialysis: Secondary | ICD-10-CM | POA: Diagnosis not present

## 2022-03-19 MED ORDER — CLOPIDOGREL BISULFATE 75 MG PO TABS: 75.0000 mg | ORAL_TABLET | Freq: Every day | ORAL | 1 refills | Status: DC

## 2022-03-19 NOTE — Patient Instructions (Signed)
Medication Instructions:  Your physician has recommended you make the following change in your medication:  STOP COUMADIN  START PLAVIX 75 MG DAILY  TAKE ASPIRIN 81 MG DAILY *If you need a refill on your cardiac medications before your next appointment, please call your pharmacy*   Lab Work: TODAY: BMET If you have labs (blood work) drawn today and your tests are completely normal, you will receive your results only by: [place] (if you have MyChart) OR A paper copy in the mail If you have any lab test that is abnormal or we need to change your treatment, we will call you to review the results.   Testing/Procedures: SEE INSTRUCTION LETTER   Follow-Up: At [facility], you and your health needs are our priority.  As part of our continuing mission to provide you with exceptional heart care, we have created designated Provider Care Teams.  These Care Teams include your primary Cardiologist (physician) and Advanced Practice Providers (APPs -  Physician Assistants and Nurse Practitioners) who all work together to provide you with the care you need, when you need it.  We recommend signing up for the patient portal called "MyChart".  Sign up information is provided on this After Visit Summary.  MyChart is used to connect with patients for Virtual Visits (Telemedicine).  Patients are able to view lab/test results, encounter notes, upcoming appointments, etc.  Non-urgent messages can be sent to your provider as well.   To learn more about what you can do with MyChart, go to [email or web address].    Your next appointment:   SOMEONE WITH THE STRUCTURAL TEAM WILL CALL YOU  Important Information About Sugar

## 2022-03-20 LAB — BASIC METABOLIC PANEL
BUN/Creatinine Ratio: 4 — ABNORMAL LOW (ref 12–28)
BUN: 23 mg/dL (ref 8–27)
CO2: 24 mmol/L (ref 20–29)
Calcium: 9.2 mg/dL (ref 8.7–10.3)
Chloride: 95 mmol/L — ABNORMAL LOW (ref 96–106)
Creatinine, Ser: 5.86 mg/dL — ABNORMAL HIGH (ref 0.57–1.00)
Glucose: 103 mg/dL — ABNORMAL HIGH (ref 70–99)
Potassium: 4.2 mmol/L (ref 3.5–5.2)
Sodium: 140 mmol/L (ref 134–144)
eGFR: 7 mL/min/{1.73_m2} — ABNORMAL LOW (ref >59)

## 2022-03-21 ENCOUNTER — Ambulatory Visit (HOSPITAL_COMMUNITY): Admit: 2022-03-21 | Payer: Medicare Other | Admitting: Cardiovascular Disease

## 2022-03-21 ENCOUNTER — Encounter (HOSPITAL_COMMUNITY): Payer: Self-pay

## 2022-03-21 DIAGNOSIS — N186 End stage renal disease: Secondary | ICD-10-CM | POA: Diagnosis not present

## 2022-03-21 DIAGNOSIS — T782XXS Anaphylactic shock, unspecified, sequela: Secondary | ICD-10-CM | POA: Diagnosis not present

## 2022-03-21 DIAGNOSIS — Z992 Dependence on renal dialysis: Secondary | ICD-10-CM | POA: Diagnosis not present

## 2022-03-21 DIAGNOSIS — N2581 Secondary hyperparathyroidism of renal origin: Secondary | ICD-10-CM | POA: Diagnosis not present

## 2022-03-21 DIAGNOSIS — I158 Other secondary hypertension: Secondary | ICD-10-CM | POA: Diagnosis not present

## 2022-03-21 DIAGNOSIS — D631 Anemia in chronic kidney disease: Secondary | ICD-10-CM | POA: Diagnosis not present

## 2022-03-21 SURGERY — ECHOCARDIOGRAM, TRANSESOPHAGEAL
Anesthesia: Moderate Sedation

## 2022-03-24 DIAGNOSIS — N2581 Secondary hyperparathyroidism of renal origin: Secondary | ICD-10-CM | POA: Diagnosis not present

## 2022-03-24 DIAGNOSIS — Z992 Dependence on renal dialysis: Secondary | ICD-10-CM | POA: Diagnosis not present

## 2022-03-24 DIAGNOSIS — N186 End stage renal disease: Secondary | ICD-10-CM | POA: Diagnosis not present

## 2022-03-24 DIAGNOSIS — D631 Anemia in chronic kidney disease: Secondary | ICD-10-CM | POA: Diagnosis not present

## 2022-03-24 DIAGNOSIS — T782XXS Anaphylactic shock, unspecified, sequela: Secondary | ICD-10-CM | POA: Diagnosis not present

## 2022-03-26 ENCOUNTER — Encounter: Payer: Self-pay | Admitting: Cardiology

## 2022-03-26 ENCOUNTER — Ambulatory Visit: Payer: Medicare Other | Admitting: Cardiology

## 2022-03-26 VITALS — BP 154/60 | HR 76 | Temp 98.5°F | Resp 16 | Ht 65.0 in | Wt 194.0 lb

## 2022-03-26 DIAGNOSIS — T782XXS Anaphylactic shock, unspecified, sequela: Secondary | ICD-10-CM | POA: Diagnosis not present

## 2022-03-26 DIAGNOSIS — I480 Paroxysmal atrial fibrillation: Secondary | ICD-10-CM | POA: Diagnosis not present

## 2022-03-26 DIAGNOSIS — D631 Anemia in chronic kidney disease: Secondary | ICD-10-CM | POA: Diagnosis not present

## 2022-03-26 DIAGNOSIS — I6523 Occlusion and stenosis of bilateral carotid arteries: Secondary | ICD-10-CM | POA: Diagnosis not present

## 2022-03-26 DIAGNOSIS — I10 Essential (primary) hypertension: Secondary | ICD-10-CM | POA: Diagnosis not present

## 2022-03-26 DIAGNOSIS — I4811 Longstanding persistent atrial fibrillation: Secondary | ICD-10-CM | POA: Diagnosis not present

## 2022-03-26 DIAGNOSIS — I951 Orthostatic hypotension: Secondary | ICD-10-CM

## 2022-03-26 DIAGNOSIS — N186 End stage renal disease: Secondary | ICD-10-CM | POA: Diagnosis not present

## 2022-03-26 DIAGNOSIS — N2581 Secondary hyperparathyroidism of renal origin: Secondary | ICD-10-CM | POA: Diagnosis not present

## 2022-03-26 DIAGNOSIS — Z95818 Presence of other cardiac implants and grafts: Secondary | ICD-10-CM

## 2022-03-26 DIAGNOSIS — Z992 Dependence on renal dialysis: Secondary | ICD-10-CM | POA: Diagnosis not present

## 2022-03-26 NOTE — Progress Notes (Signed)
Primary Physician/Referring:  [name], [name], MD  [name], female    DOB: 1946-07-02, 76 y.o.   MRN: [id number]  Chief Complaint  Patient presents with   Atrial Fibrillation   bp concern    Follow-up   HPI:    Jane [name]  is a 76 y.o. female with paroxysmal atrial fibrillation, on low-dose amiodarone, dose reduced due to prolonged QT and eventually discontinued, end-stage disease on hemodialysis on Monday Wednesday and Friday, orthostatic hypotension, obstructive sleep apnea not on CPAP.   Due to recurrent GI bleed including gastritis, gastric polyps, colonic polyps, AVMs, she has had scratch that in spite of partial colectomy in 2019, she has had recurrent GI bleed. She underwent left atrial appendage closure on 02/06/2022 with implantation of a 27 mm watchman flex device.  She made an appointment to see me due to elevated blood pressure.  She does not feel well when her blood pressure is elevated.  She has had 1 episode of atrial fibrillation yesterday for which she took diltiazem and states that heart rate returned back to normal within an hour.  Otherwise remains asymptomatic.  Past Medical History:  Diagnosis Date   Acid reflux    Anemia of chronic disease    Arthritis    Asthma    Atrial fibrillation ([place])    AVM (arteriovenous malformation)    Bilateral carotid bruits    Complication of anesthesia    "hard to wake up, I have sleep apnea" no CPAP   Diverticulitis    Duodenal ulcer    Dysrhythmia    Afib   ESRD (end stage renal disease) ([place])    MWF [place]   Gallbladder sludge    GI bleed    Headache    Heart murmur    Hemorrhoids    History of blood transfusion    Hypertension    Malaise and fatigue    Orthostatic hypotension    PAF (paroxysmal atrial fibrillation) ([place])    Presence of Watchman left atrial appendage closure device 02/06/2022   Watchman FLX 73m with Dr. CBurt Knack  Shortness of breath    " when I walk to fast"   Sleep  apnea    Syncope    Tubulovillous adenoma of colon    Social History   Tobacco Use   Smoking status: Never    Passive exposure: Never   Smokeless tobacco: Never  Substance Use Topics   Alcohol use: No   ROS  Review of Systems  HENT:  Ear discharge: anemia due to chronic blood loss.   Cardiovascular:  Negative for chest pain, dyspnea on exertion, leg swelling, palpitations and syncope.  Gastrointestinal:  Positive for melena. Negative for constipation and hemorrhoids.   Objective  Blood pressure (!) 154/60, pulse 76, temperature 98.5 F (36.9 C), temperature source Temporal, resp. rate 16, height '5\' 5"'$  (1.651 m), weight 194 lb (88 kg), SpO2 96 %.      03/26/2022   12:59 PM 03/26/2022   12:54 PM 03/19/2022    3:48 PM  Vitals with BMI  Height  '5\' 5"'$  '5\' 5"'$   Weight  194 lbs 197 lbs  BMI  368.34319.62 Systolic [id number] Diastolic 60 67 68  Pulse 76 75 61   Orthostatic VS for the past 72 hrs (Last 3 readings):  Patient Position BP Location Cuff Size  03/26/22 1259 Sitting Left Arm Large  03/26/22 1254 Sitting Left Arm Large  Physical Exam Constitutional:      Comments: Moderately obese  Neck:     Vascular: Carotid bruit (Bilateral) present. No JVD.  Cardiovascular:     Rate and Rhythm: Regular rhythm. Bradycardia present.     Pulses: Intact distal pulses.     Heart sounds: Murmur heard.     Early systolic murmur is present with a grade of 2/6 at the upper right sternal border.     No gallop.     Comments: Right arm AV fistula noted and functioning. Pulmonary:     Effort: Pulmonary effort is normal.     Breath sounds: Normal breath sounds.  Abdominal:     General: Bowel sounds are normal.     Palpations: Abdomen is soft.  Musculoskeletal:     Right lower leg: No edema.     Left lower leg: No edema.    Laboratory examination:   Recent Labs    09/26/21 0208 09/27/21 1211 09/29/21 0658 10/02/21 1113 02/03/22 1236 02/06/22 0632 03/19/22 1621  NA 141 137    < > 134* 136 137 140  K 4.2 4.5   < > 3.6 3.6 4.2 4.2  CL 105 98   < > 97* 95* 97* 95*  CO2 23 21*  --  25 33*  --  24  GLUCOSE 89 74   < > 107* 120* 89 103*  BUN 25* 44*   < > 31* 26 33* 23  CREATININE 7.03* 10.35*   < > 9.64* 6.82* 9.30* 5.86*  CALCIUM 9.5 9.6  --  9.0 9.2  --  9.2  GFRNONAA 6* 4*  --  4*  --   --   --    < > = values in this interval not displayed.   estimated creatinine clearance is 8.8 mL/min (A) (by C-G formula based on SCr of 5.86 mg/dL (H)).     Latest Ref Rng & Units 03/19/2022    4:21 PM 02/06/2022    6:32 AM 02/03/2022   12:36 PM  CMP  Glucose 70 - 99 mg/dL 103  89  120   BUN 8 - 27 mg/dL 23  33  26   Creatinine 0.57 - 1.00 mg/dL 5.86  9.30  6.82   Sodium 134 - 144 mmol/L 140  137  136   Potassium 3.5 - 5.2 mmol/L 4.2  4.2  3.6   Chloride 96 - 106 mmol/L 95  97  95   CO2 20 - 29 mmol/L 24   33   Calcium 8.7 - 10.3 mg/dL 9.2   9.2       Latest Ref Rng & Units 02/06/2022    6:32 AM 02/03/2022   12:36 PM 10/02/2021   11:13 AM  CBC  WBC 3.4 - 10.8 x10E3/uL  5.0  6.2   Hemoglobin 12.0 - 15.0 g/dL 15.0  12.0  7.5   Hematocrit 36.0 - 46.0 % 44.0  37.0  23.4   Platelets 150 - 450 x10E3/uL  269  137    Lipid Panel     Component Value Date/Time   CHOL 198 10/06/2019 1510   TRIG 51 10/06/2019 1510   HDL 53 10/06/2019 1510   CHOLHDL 3.7 10/06/2019 1510   VLDL 10 10/06/2019 1510   LDLCALC 135 (H) 10/06/2019 1510   TSH No results for input(s): "TSH" in the last 8760 hours.  External labs:  Labs 02/23/2021:  151, creatinine 8.48, potassium 3.7, sodium 142.  Hb 9.0/HCT 30.6, normal indicis.  Medications  and allergies   Allergies  Allergen Reactions   Latex Rash   Penicillins Other (See Comments)    Yeast infection / Childhood   Sulfa Antibiotics Rash   Tape Other (See Comments)    Plastic, silicone, and paper tape causes bruising and pulls off skin. Cloth tape works fine   Reglan [Metoclopramide] Itching    Itchiness, bug crawling sensation       Current Outpatient Medications:    aspirin EC 81 MG tablet, Take 1 tablet (81 mg total) by mouth daily. Swallow whole., Disp: 30 tablet, Rfl: 12   atorvastatin (LIPITOR) 10 MG tablet, TAKE ONE TABLET BY MOUTH ONCE DAILY, Disp: 30 tablet, Rfl: 3   B Complex-C-Zn-Folic Acid (DIALYVITE 784 WITH ZINC) 0.8 MG TABS, Take 1 tablet by mouth at bedtime., Disp: , Rfl:    CALCITRIOL PO, Take 2 tablets by mouth every Monday, Wednesday, and Friday with hemodialysis., Disp: , Rfl:    cinacalcet (SENSIPAR) 30 MG tablet, Take 30 mg by mouth every Monday, Wednesday, and Friday with hemodialysis. Post dialysis, Disp: , Rfl:    clopidogrel (PLAVIX) 75 MG tablet, Take 1 tablet (75 mg total) by mouth daily., Disp: 90 tablet, Rfl: 1   diltiazem (CARDIZEM) 60 MG tablet, Take 1 tablet (60 mg total) by mouth 3 (three) times daily as needed (Atrial fibrillation)., Disp: 30 tablet, Rfl: 1   ethyl chloride spray, Apply 1 application topically every Monday, Wednesday, and Friday with hemodialysis., Disp: , Rfl: 12   famotidine (PEPCID) 20 MG tablet, TAKE ONE TABLET BY MOUTH EVERYDAY AT BEDTIME, Disp: 30 tablet, Rfl: 2   linaclotide (LINZESS) 72 MCG capsule, Take 1 capsule (72 mcg total) by mouth daily before breakfast., Disp: 30 capsule, Rfl: 2   Methoxy PEG-Epoetin Beta (MIRCERA IJ), Inject 1 Dose into the vein every Monday, Wednesday, and Friday with hemodialysis., Disp: , Rfl:    midodrine (PROAMATINE) 10 MG tablet, TAKE ONE TABLET three times daily AS NEEDED FOR low blood pressure AND dizziness AND lightheadedness) (Patient taking differently: Take 10 mg by mouth 3 (three) times daily as needed. Take 1 to 2 tablets on the days of dialysis, postdialysis hold unless SBP <90 mmHg), Disp: 90 tablet, Rfl: 3   pantoprazole (PROTONIX) 40 MG tablet, Take 1 tablet (40 mg total) by mouth 2 (two) times daily., Disp: 180 tablet, Rfl: 2   sevelamer carbonate (RENVELA) 800 MG tablet, Take 1,600 mg by mouth in the morning and at  bedtime. With meals take 800 mg with snack, Disp: , Rfl:    carvedilol (COREG) 6.25 MG tablet, Take 1 tablet (6.25 mg total) by mouth 2 (two) times daily. Take two tablets if SBP >150 mm Hg, Disp: 180 tablet, Rfl: 3   Radiology:   No results found.  Cardiac Studies:    Lexiscan myoview stress test 09/06/2018:  1. Lexiscan stress test was performed. Exercise capacity was not assessed. Stress symptoms included dyspnea, dizziness. Peak effect blood pressure was 158/68 mmHg. The resting and stress electrocardiogram demonstrated normal sinus rhythm, normal resting conduction, no resting arrhythmias and normal rest repolarization.  Stress EKG is non diagnostic for ischemia as it is a pharmacologic stress.  2. The overall quality of the study is good. There is no evidence of abnormal lung activity. Stress and rest SPECT images demonstrate homogeneous tracer distribution throughout the myocardium. Gated SPECT imaging reveals normal myocardial thickening and wall motion. The left ventricular ejection fraction was normal (61%).   3. Low risk study.  Carotid artery  duplex  04/24/2020: Stenosis in the right external carotid artery (<50%). Minimal stenosis in the left internal carotid artery (minimal). Stenosis in the left common carotid artery (<50%). There is mild diffuse heterogeneous plaque in bilateral carotid arteries. Antegrade right vertebral artery flow.  Follow up in one year is appropriate if clinically indicated. No significant change from 12/02/2016.  Echocardiogram 12/27/2020: Normal LV systolic function with visual EF 60-65%. Left ventricle cavity is normal in size. Moderate left ventricular hypertrophy. Normal global wallmotion. Indeterminate diastolic filling pattern, elevated LAP. No significant valvular heart disease. Compared to study dated 11/24/2017: LVEF is preserved, G2DD is now indeterminate with elevated LAP, MR and TR have improved, otherwise nosignificant change.  EKG  EKG  03/26/2022: Normal sinus rhythm at rate of 79 bpm, left atrial enlargement, left axis deviation, left anterior fascicular block.  Poor R progression, cannot exclude anteroseptal infarct old.  Nonspecific T abnormality.  Normal QT interval.  No significant change from 11/16/2020.  EKG 11/26/2020: Atypical atrial flutter with RVR at rate of 136 bpm, left axis deviation, left anterior fascicular block.  No evidence of ischemia.  Assessment     ICD-10-CM   1. Longstanding persistent atrial fibrillation (HCC)  I48.11 EKG 12-Lead    2. Presence of left atrial appendage closure device 27 mm Watchman flex device 02/06/2022  Z95.818     3. Primary hypertension  I10 carvedilol (COREG) 6.25 MG tablet    4. Orthostatic hypotension  I95.1       CHA2DS2-VASc Score is 4.  Yearly risk of stroke: 4.8% (HTN, female, age 12).  Score of 1=0.6; 2=2.2; 3=3.2; 4=4.8; 5=7.2; 6=9.8; 7=>9.8) -(CHF; HTN; vasc disease DM,  Female = 1; Age <65 =0; 65-74 = 1,  >75 =2; stroke/embolism= 2).   No orders of the defined types were placed in this encounter.  Medications Discontinued During This Encounter  Medication Reason   carvedilol (COREG) 6.25 MG tablet Reorder   Recommendations:   Jane [name]  is a 76 y.o. female with paroxysmal atrial fibrillation, on low-dose amiodarone, dose reduced due to prolonged QT and eventually discontinued, end-stage disease on hemodialysis on Monday Wednesday and Friday, orthostatic hypotension, obstructive sleep apnea not on CPAP.   Due to recurrent GI bleed including gastritis, gastric polyps, colonic polyps, AVMs, she has had scratch that in spite of partial colectomy in 2019, she has had recurrent GI bleed. She underwent left atrial appendage closure on 02/06/2022 with implantation of a 27 mm watchman flex device.  She made an appointment to see me due to elevated blood pressure.  On further questioning, she was on carvedilol 25 mg twice daily which was reduced to 6.25 mg and to be taken  only on a as needed basis.  I suspect a rebound hypertension to be the etiology as there is episodes where she has not taken any medications at all.  Advised her to continue taking carvedilol at 6.25 mg twice daily dose and if SYS BP >150 mmHg, she could certainly take 76.1 mg as needed.  However if systolic blood pressure is <90, advised her to take at least 3.125 mg dose to avoid rebound hypertension.  Patient has also been taking midodrine in the evening when she comes back from dialysis.  As she is essentially bedbound after dialysis, advised her not to take any midodrine when she is resting or she is aware that she will not be up on her feet.  This could also be the reason why her blood pressure has been elevated  as well.  With regard to atrial fibrillation, she is maintaining sinus rhythm/regular rhythm on auscultation.  She is successful Watchman device placement, presently off of anticoagulation with warfarin.  I will see her back as scheduled in December 2023.    She is enrolled in RPM.      Adrian Prows, MD, [facility] 03/26/2022, 9:37 PM Office: [phone]

## 2022-03-28 DIAGNOSIS — D631 Anemia in chronic kidney disease: Secondary | ICD-10-CM | POA: Diagnosis not present

## 2022-03-28 DIAGNOSIS — N2581 Secondary hyperparathyroidism of renal origin: Secondary | ICD-10-CM | POA: Diagnosis not present

## 2022-03-28 DIAGNOSIS — N186 End stage renal disease: Secondary | ICD-10-CM | POA: Diagnosis not present

## 2022-03-28 DIAGNOSIS — T782XXS Anaphylactic shock, unspecified, sequela: Secondary | ICD-10-CM | POA: Diagnosis not present

## 2022-03-28 DIAGNOSIS — Z992 Dependence on renal dialysis: Secondary | ICD-10-CM | POA: Diagnosis not present

## 2022-03-31 DIAGNOSIS — Z992 Dependence on renal dialysis: Secondary | ICD-10-CM | POA: Diagnosis not present

## 2022-03-31 DIAGNOSIS — N186 End stage renal disease: Secondary | ICD-10-CM | POA: Diagnosis not present

## 2022-03-31 DIAGNOSIS — T782XXS Anaphylactic shock, unspecified, sequela: Secondary | ICD-10-CM | POA: Diagnosis not present

## 2022-03-31 DIAGNOSIS — D631 Anemia in chronic kidney disease: Secondary | ICD-10-CM | POA: Diagnosis not present

## 2022-03-31 DIAGNOSIS — N2581 Secondary hyperparathyroidism of renal origin: Secondary | ICD-10-CM | POA: Diagnosis not present

## 2022-03-31 NOTE — Progress Notes (Unsigned)
ACUTE VISIT Chief Complaint  Patient presents with   Follow-up   HPI: [name] is a 76 y.o. female, who is here today complaining of elevated BP for the past 1-2 weeks.  She was last seen on 11/12/21. Since her last visit she has undergone Watchman procedure. She is not longer on coumadin. She is on Plavix 75 mg and Aspirin. BP's 200's/110, elevated BP's even after dialysis.  She saw her cardiologist, Dr Einar Gip, on 03/26/22. She is on Carvedilol 6.25 mg tid. She takes Diltiazem 60 mg tid prn, has been taking it daily for palpitations with associated mid chest tightness,SOB, and jaw numbness. These symptoms have been going on for a week. Exacerbated by mild to moderate exertion, usually when walking for 5-10 min, alleviated by resting for 20 mg. Diltiazem helps. She has not noted orthopnea,, PND,or edema.  She has stopped Midodrine. Lexiscan myoview stress test 09/06/2018:  1.Stress EKG is non diagnostic for ischemia as it is a pharmacologic stress.  2.Stress and rest SPECT images demonstrate homogeneous tracer distribution throughout the myocardium. Gated SPECT imaging reveals normal myocardial thickening and wall motion. The left ventricular ejection fraction was normal (61%).   3. Low risk study.  Review of Systems  Constitutional:  Positive for activity change and fatigue. Negative for appetite change, diaphoresis and fever.  HENT:  Negative for mouth sores, nosebleeds and sore throat.   Eyes:  Negative for redness and visual disturbance.  Respiratory:  Negative for cough and wheezing.   Gastrointestinal:  Negative for abdominal pain, nausea and vomiting.       Negative for changes in bowel habits.  Musculoskeletal:  Positive for arthralgias, back pain (chronic) and gait problem.  Neurological:  Negative for syncope and headaches.  Psychiatric/Behavioral:  Negative for confusion. The patient is nervous/anxious.   Rest see pertinent positives and negatives per  HPI.  Current Outpatient Medications on File Prior to Visit  Medication Sig Dispense Refill   aspirin EC 81 MG tablet Take 1 tablet (81 mg total) by mouth daily. Swallow whole. 30 tablet 12   atorvastatin (LIPITOR) 10 MG tablet TAKE ONE TABLET BY MOUTH ONCE DAILY 30 tablet 3   B Complex-C-Zn-Folic Acid (DIALYVITE 809 WITH ZINC) 0.8 MG TABS Take 1 tablet by mouth at bedtime.     CALCITRIOL PO Take 2 tablets by mouth every Monday, Wednesday, and Friday with hemodialysis.     carvedilol (COREG) 6.25 MG tablet Take 12.5 mg by mouth 2 (two) times daily. 180 tablet 3   cinacalcet (SENSIPAR) 30 MG tablet Take 30 mg by mouth every Monday, Wednesday, and Friday with hemodialysis. Post dialysis     clopidogrel (PLAVIX) 75 MG tablet Take 1 tablet (75 mg total) by mouth daily. 90 tablet 1   diltiazem (CARDIZEM) 60 MG tablet Take 1 tablet (60 mg total) by mouth 3 (three) times daily as needed (Atrial fibrillation). 30 tablet 1   ethyl chloride spray Apply 1 application topically every Monday, Wednesday, and Friday with hemodialysis.  12   famotidine (PEPCID) 20 MG tablet TAKE ONE TABLET BY MOUTH EVERYDAY AT BEDTIME 30 tablet 2   linaclotide (LINZESS) 72 MCG capsule Take 1 capsule (72 mcg total) by mouth daily before breakfast. 30 capsule 2   Methoxy PEG-Epoetin Beta (MIRCERA IJ) Inject 1 Dose into the vein every Monday, Wednesday, and Friday with hemodialysis.     pantoprazole (PROTONIX) 40 MG tablet Take 1 tablet (40 mg total) by mouth 2 (two) times daily. 180 tablet 2  sevelamer carbonate (RENVELA) 800 MG tablet Take 1,600 mg by mouth in the morning and at bedtime. With meals take 800 mg with snack     No current facility-administered medications on file prior to visit.   Past Medical History:  Diagnosis Date   Acid reflux    Anemia of chronic disease    Arthritis    Asthma    Atrial fibrillation ([place])    AVM (arteriovenous malformation)    Bilateral carotid bruits    Complication of anesthesia     "hard to wake up, I have sleep apnea" no CPAP   Diverticulitis    Duodenal ulcer    Dysrhythmia    Afib   ESRD (end stage renal disease) ([place])    MWF [place]   Gallbladder sludge    GI bleed    Headache    Heart murmur    Hemorrhoids    History of blood transfusion    Hypertension    Malaise and fatigue    Orthostatic hypotension    PAF (paroxysmal atrial fibrillation) ([place])    Presence of Watchman left atrial appendage closure device 02/06/2022   Watchman FLX 16m with Dr. CBurt Knack  Shortness of breath    " when I walk to fast"   Sleep apnea    Syncope    Tubulovillous adenoma of colon    Allergies  Allergen Reactions   Latex Rash   Penicillins Other (See Comments)    Yeast infection / Childhood   Sulfa Antibiotics Rash   Tape Other (See Comments)    Plastic, silicone, and paper tape causes bruising and pulls off skin. Cloth tape works fine   Reglan [Metoclopramide] Itching    Itchiness, bug crawling sensation     Social History   Socioeconomic History   Marital status: Divorced    Spouse name: Not on file   Number of children: 4   Years of education: 14   Highest education level: Associate degree: occupational, tHotel manager or vocational program  Occupational History   Occupation: Retired  Tobacco Use   Smoking status: Never    Passive exposure: Never   Smokeless tobacco: Never  Vaping Use   Vaping Use: Never used  Substance and Sexual Activity   Alcohol use: No   Drug use: No   Sexual activity: Never  Other Topics Concern   Not on file  Social History Narrative   HH 1   Divorced   Outpatient dialysis Mon, Wed, Fri   4 children: 1 daughter locally is an EDesigner, multimedia[place] 3 [place]   Financial Resource Strain: High Risk (08/21/2021)   Overall Financial Resource Strain (CARDIA)    Difficulty of Paying Living Expenses: Very hard  Food Insecurity: Food Insecurity Present (08/21/2021)   Hunger Vital Sign     Worried About [place] the Last Year: Often true    [place] the Last Year: Often true  Transportation Needs: Unmet Transportation Needs (08/21/2021)   PRAPARE - THydrologist(Medical): Yes    Lack of Transportation (Non-Medical): No  Physical Activity: Inactive (08/06/2021)   Exercise Vital Sign    Days of Exercise per Week: 0 days    Minutes of Exercise per Session: 0 min  Stress: Stress Concern Present (08/21/2021)   [place]   Feeling of Stress : Very much  Social Connections: Moderately Isolated (07/31/2020)   Social Connection and Isolation Panel [NHANES]    Frequency of Communication with Friends and Family: More than three times a week    Frequency of Social Gatherings with Friends and Family: Never    Attends Religious Services: More than 4 times per year    Active Member of Clubs or Organizations: No    Attends Archivist Meetings: Never    Marital Status: Divorced   Vitals:   04/01/22 0800 04/01/22 0922  BP: 138/60 (!) 152/65  Pulse: 71   Resp: 16   Temp: 98.3 F (36.8 C)   SpO2: 99%    Body mass index is 33.36 kg/m.  Physical Exam Vitals and nursing note reviewed.  Constitutional:      General: She is not in acute distress.    Appearance: She is well-developed and well-groomed.  HENT:     Head: Normocephalic and atraumatic.     Mouth/Throat:     Mouth: Mucous membranes are moist.     Pharynx: Oropharynx is clear.  Eyes:     Conjunctiva/sclera: Conjunctivae normal.  Cardiovascular:     Rate and Rhythm: Normal rate and regular rhythm. Occasional Extrasystoles are present.    Pulses:          Dorsalis pedis pulses are 2+ on the right side and 2+ on the left side.     Heart sounds: Murmur (SEM II/VI RUSB) heard.     Comments: Right UE AV fistula. Pulmonary:     Effort: Pulmonary effort is normal. No respiratory distress.     Breath  sounds: Normal breath sounds.  Abdominal:     Palpations: Abdomen is soft.     Tenderness: There is no abdominal tenderness.  Musculoskeletal:     Right lower leg: No edema.     Left lower leg: No edema.  Skin:    General: Skin is warm.     Findings: No erythema or rash.  Neurological:     General: No focal deficit present.     Mental Status: She is alert and oriented to person, place, and time.     Cranial Nerves: No cranial nerve deficit.     Gait: Gait normal.     Comments: Antalgic gait, not assisted.  Psychiatric:        Mood and Affect: Affect normal. Mood is anxious.   ASSESSMENT AND PLAN:  Jane [name] was seen today for follow-up.  Diagnoses and all orders for this visit:  Hypertension with renal disease BP mildly elevated today, home BP's 200's/110's. She has Carvedilol 12.5 mg, so recommend increasing Carvedilol from 6.25 mg tid to 12.5 mg bid. Imdur 30 mg added today. We discussed some side effects of medications, including the risk for hypotension. Continue monitoring BP regularly. F/U in 4 weeks, before if needed.  -     isosorbide mononitrate (IMDUR) 30 MG 24 hr tablet; Take 1 tablet (30 mg total) by mouth daily.  Chest tightness Suggestive of angina. Stress test in 2020 negative. We discussed differential Dx's. Today Imdur 30 mg added and Carvedilol dose increased.  Arrange f/u appt with her cardiologist.  She was clearly instructed about warning signs and she voices understanding. I will send a message to Dr Einar Gip.  -     isosorbide mononitrate (IMDUR) 30 MG 24 hr tablet; Take 1 tablet (30 mg total) by mouth daily.  I spent a total of 40 minutes in both face to face and non face to face  activities for this visit on the date of this encounter. During this time history was obtained and documented, examination was performed, prior labs/imaging reviewed, and assessment/plan discussed.  Return in about 4 weeks (around 04/29/2022).  Hyatt Capobianco G. [name],  MD  [facility]. [place] office.

## 2022-04-01 ENCOUNTER — Ambulatory Visit (INDEPENDENT_AMBULATORY_CARE_PROVIDER_SITE_OTHER): Payer: Medicare Other | Admitting: Family Medicine

## 2022-04-01 ENCOUNTER — Telehealth: Payer: Self-pay | Admitting: Family Medicine

## 2022-04-01 ENCOUNTER — Encounter: Payer: Self-pay | Admitting: Family Medicine

## 2022-04-01 VITALS — BP 152/65 | HR 71 | Temp 98.3°F | Resp 16 | Ht 65.0 in | Wt 200.5 lb

## 2022-04-01 DIAGNOSIS — I6523 Occlusion and stenosis of bilateral carotid arteries: Secondary | ICD-10-CM | POA: Diagnosis not present

## 2022-04-01 DIAGNOSIS — I129 Hypertensive chronic kidney disease with stage 1 through stage 4 chronic kidney disease, or unspecified chronic kidney disease: Secondary | ICD-10-CM | POA: Diagnosis not present

## 2022-04-01 DIAGNOSIS — R0789 Other chest pain: Secondary | ICD-10-CM | POA: Diagnosis not present

## 2022-04-01 DIAGNOSIS — R079 Chest pain, unspecified: Secondary | ICD-10-CM

## 2022-04-01 MED ORDER — ISOSORBIDE MONONITRATE ER 30 MG PO TB24: 30.0000 mg | ORAL_TABLET | Freq: Every day | ORAL | 1 refills | Status: DC

## 2022-04-01 NOTE — Patient Instructions (Addendum)
A few things to remember from today's visit:  Hypertension with renal disease - Plan: isosorbide mononitrate (IMDUR) 30 MG 24 hr tablet  Chest pain with moderate risk for cardiac etiology - Plan: isosorbide mononitrate (IMDUR) 30 MG 24 hr tablet  If you need refills for medications you take chronically, please call your pharmacy. Do not use My Chart to request refills or for acute issues that need immediate attention.  Please be sure medication list is accurate. If a new problem present, please set up appointment sooner than planned today.  Today Carvedilol increased from 6.25 mg 3 times daily to 12.5 mg 2 times daily. Imdur 30 mg added today. We need to monitor blood pressure closely because these changes can cause low blood pressure.  Rest unchanged.

## 2022-04-01 NOTE — Telephone Encounter (Signed)
Pt daughter call and stated she would like a call back about pt appt for today because she is not getting any understanding from pt.

## 2022-04-02 ENCOUNTER — Encounter: Payer: Self-pay | Admitting: Family Medicine

## 2022-04-02 DIAGNOSIS — N186 End stage renal disease: Secondary | ICD-10-CM | POA: Diagnosis not present

## 2022-04-02 DIAGNOSIS — N2581 Secondary hyperparathyroidism of renal origin: Secondary | ICD-10-CM | POA: Diagnosis not present

## 2022-04-02 DIAGNOSIS — D631 Anemia in chronic kidney disease: Secondary | ICD-10-CM | POA: Diagnosis not present

## 2022-04-02 DIAGNOSIS — I4891 Unspecified atrial fibrillation: Secondary | ICD-10-CM | POA: Diagnosis not present

## 2022-04-02 DIAGNOSIS — Z992 Dependence on renal dialysis: Secondary | ICD-10-CM | POA: Diagnosis not present

## 2022-04-02 DIAGNOSIS — T782XXS Anaphylactic shock, unspecified, sequela: Secondary | ICD-10-CM | POA: Diagnosis not present

## 2022-04-02 NOTE — Telephone Encounter (Signed)
Please let her know, she was seen because elevated BP and c/o chest tightness. Carvedilol was increased from 6.25 mg tid to 12 mg bid and Imdur 30 mg was added. She is to follow in 4 weeks. Can you also please call Ms Leadbetter and be sure she understood instructions. Thanks, BJ

## 2022-04-02 NOTE — Telephone Encounter (Signed)
I spoke with patient's daughter. We went over the information below & she still had some questions about the conversation between you & the patient. She isn't sure if she misunderstood something, but patient was discussing her heart health with her daughter. I told patient's daughter that I would talk with you and see if you can give her a call later this afternoon? Her phone number is [phone].

## 2022-04-04 ENCOUNTER — Telehealth: Payer: Self-pay | Admitting: Family Medicine

## 2022-04-04 ENCOUNTER — Encounter: Payer: Self-pay | Admitting: Family Medicine

## 2022-04-04 ENCOUNTER — Telehealth (INDEPENDENT_AMBULATORY_CARE_PROVIDER_SITE_OTHER): Payer: Medicare Other | Admitting: Family Medicine

## 2022-04-04 DIAGNOSIS — W19XXXD Unspecified fall, subsequent encounter: Secondary | ICD-10-CM

## 2022-04-04 DIAGNOSIS — T782XXS Anaphylactic shock, unspecified, sequela: Secondary | ICD-10-CM | POA: Diagnosis not present

## 2022-04-04 DIAGNOSIS — Z992 Dependence on renal dialysis: Secondary | ICD-10-CM | POA: Diagnosis not present

## 2022-04-04 DIAGNOSIS — M25562 Pain in left knee: Secondary | ICD-10-CM | POA: Diagnosis not present

## 2022-04-04 DIAGNOSIS — N186 End stage renal disease: Secondary | ICD-10-CM | POA: Diagnosis not present

## 2022-04-04 DIAGNOSIS — N2581 Secondary hyperparathyroidism of renal origin: Secondary | ICD-10-CM | POA: Diagnosis not present

## 2022-04-04 DIAGNOSIS — W19XXXA Unspecified fall, initial encounter: Secondary | ICD-10-CM | POA: Diagnosis not present

## 2022-04-04 DIAGNOSIS — I129 Hypertensive chronic kidney disease with stage 1 through stage 4 chronic kidney disease, or unspecified chronic kidney disease: Secondary | ICD-10-CM | POA: Diagnosis not present

## 2022-04-04 DIAGNOSIS — D631 Anemia in chronic kidney disease: Secondary | ICD-10-CM | POA: Diagnosis not present

## 2022-04-04 DIAGNOSIS — Y92009 Unspecified place in unspecified non-institutional (private) residence as the place of occurrence of the external cause: Secondary | ICD-10-CM | POA: Diagnosis not present

## 2022-04-04 NOTE — Progress Notes (Signed)
Virtual Visit via Telephone Note I connected with Jane [name] on 04/04/22 at  5:00 PM EDT by telephone and verified that I am speaking with the correct person using two identifiers.   I discussed the limitations, risks, security and privacy concerns of performing an evaluation and management service by telephone and the availability of in person appointments. I also discussed with the patient that there may be a patient responsible charge related to this service. The patient expressed understanding and agreed to proceed.  Location patient: home Location provider: work office Participants present for the call: Jane [name] daughter (Jane [name]), provider Patient did not have a visit in the prior 7 days to address this/these issue(s).  No chief complaint on file.  History of Present Illness: Jane [name] is a 76 y.o.female with hx of hypertension, OSA, chronic back pain, OSA, ESRD on hemodialysis, and atrial fibrillation status post Watchman procedure who was seen here in the office on 04/01/22.  Her daughter called after her visit to clarify instructions given after visit, she was not satisfied with the information provided by my assistant.  I asked if we can call Jane [name] to be on the phone during this visit,she states that "she is resting."  According to patient, Jane [name] told her: "not good new" " my heart is giving out."  I discussed reason for recent visit, elevated BP and c/o chest tightness exacerbated with exertion and associated with palpitations, dyspnea, and bilateral jaw numbness.  She told me she did not mention this to Dr Einar Gip because it started after her last visit (03/26/22).  Explained that the symptoms Jane [name] reported may be indicative of angina, so Imdur 30 mg was started. Carvedilol was also adjusted, dose was increased from 6.25 mg 3 times daily to 20 mg twice daily. I have sent a message to Dr. Einar Gip, and he graciously responded.  According to the daughter, she has been  complaining of her "heart hurting" for a while now.  She is also like me know that she had a fall today, she was walking on flip flaps, she "was not supposed to be wearing",tripped and fell. Seen at [facility]. According to the daughter, knee imaging was negative for x-ray. SBP today 130, not sure about DBP.  She had dialysis today. She is taking ibuprofen for knee pain.  Observations/Objective: N/A Patient was not present.  Assessment and Plan:  1. Hypertension with renal disease For now I recommend continue carvedilol 12.5 mg twice daily and Imdur 30 mg daily. We discussed goals of medications and some side effects. Dr. Einar Gip also recommended the possibility of trying 25 mg in the morning and 12.5 mg in the evening and hold the antihypertensive medications before dialysis , if she continues having problems with BP controled and to prevent episodes of hypotension after dialysis, as she had before. Last visit she reported SBP 200's even after dialysis. I will see her back in 4 weeks, she has an appointment 04/29/2022.  We discussed prior CV procedures, including a stress test in 08/2018, which was otherwise negative. She will continue monitoring BP closely. Clearly instructed about warning signs. She needs to arrange appt with Dr Einar Gip if chest tightness is persistent. Recommend daughter to be with her during next visit.  2. Fall, subsequent encounter Fall precautions discussed. She is having knee pain exacerbation after fall, she follows with orthopedics and pain management.  Recommend avoiding ibuprofen or naproxen, she can take Tylenol and apply topical Voltaren gel.  Local ice for 72 hours. Follow-up with Ortho if needed.  Follow Up Instructions:  No follow-ups on file.  I did not refer this patient for an OV in the next 24 hours for this/these issue(s).  I discussed the assessment and treatment plan with the patient. The patient was provided an opportunity to ask  questions and all were answered. The patient agreed with the plan and demonstrated an understanding of the instructions.   The patient was advised to call back or seek an in-person evaluation if the symptoms worsen or if the condition fails to improve as anticipated.  I provided  22 minutes of non-face-to-face time during this encounter. Reynold Mantell [name], MD

## 2022-04-07 DIAGNOSIS — D631 Anemia in chronic kidney disease: Secondary | ICD-10-CM | POA: Diagnosis not present

## 2022-04-07 DIAGNOSIS — N2581 Secondary hyperparathyroidism of renal origin: Secondary | ICD-10-CM | POA: Diagnosis not present

## 2022-04-07 DIAGNOSIS — N186 End stage renal disease: Secondary | ICD-10-CM | POA: Diagnosis not present

## 2022-04-07 DIAGNOSIS — Z992 Dependence on renal dialysis: Secondary | ICD-10-CM | POA: Diagnosis not present

## 2022-04-07 DIAGNOSIS — T782XXS Anaphylactic shock, unspecified, sequela: Secondary | ICD-10-CM | POA: Diagnosis not present

## 2022-04-09 ENCOUNTER — Telehealth (HOSPITAL_COMMUNITY): Payer: Self-pay | Admitting: *Deleted

## 2022-04-09 DIAGNOSIS — D631 Anemia in chronic kidney disease: Secondary | ICD-10-CM | POA: Diagnosis not present

## 2022-04-09 DIAGNOSIS — N2581 Secondary hyperparathyroidism of renal origin: Secondary | ICD-10-CM | POA: Diagnosis not present

## 2022-04-09 DIAGNOSIS — Z992 Dependence on renal dialysis: Secondary | ICD-10-CM | POA: Diagnosis not present

## 2022-04-09 DIAGNOSIS — N186 End stage renal disease: Secondary | ICD-10-CM | POA: Diagnosis not present

## 2022-04-09 DIAGNOSIS — T782XXS Anaphylactic shock, unspecified, sequela: Secondary | ICD-10-CM | POA: Diagnosis not present

## 2022-04-09 NOTE — Telephone Encounter (Signed)
Reaching out to patient to offer assistance regarding upcoming cardiac imaging study; pt verbalizes understanding of appt date/time, parking situation and where to check in, and verified current allergies; name and call back number provided for further questions should they arise  Ogechi Kuehnel RN Navigator Cardiac Imaging [facility] [phone] office [phone] cell  Patient aware to arrive at 8:30am. 

## 2022-04-10 ENCOUNTER — Ambulatory Visit (HOSPITAL_COMMUNITY)
Admission: RE | Admit: 2022-04-10 | Discharge: 2022-04-10 | Disposition: A | Discharge status: Home / Self Care | Payer: Medicare Other | Source: Ambulatory Visit | Attending: Cardiovascular Disease | Admitting: Cardiovascular Disease

## 2022-04-10 DIAGNOSIS — Z95818 Presence of other cardiac implants and grafts: Secondary | ICD-10-CM | POA: Diagnosis not present

## 2022-04-10 DIAGNOSIS — I480 Paroxysmal atrial fibrillation: Secondary | ICD-10-CM | POA: Insufficient documentation

## 2022-04-10 MED ORDER — IOHEXOL 350 MG/ML SOLN
100.0000 mL | Freq: Once | INTRAVENOUS | Status: AC | PRN
  Administered 2022-04-10: 100 mL via INTRAVENOUS

## 2022-04-11 DIAGNOSIS — T782XXS Anaphylactic shock, unspecified, sequela: Secondary | ICD-10-CM | POA: Diagnosis not present

## 2022-04-11 DIAGNOSIS — N2581 Secondary hyperparathyroidism of renal origin: Secondary | ICD-10-CM | POA: Diagnosis not present

## 2022-04-11 DIAGNOSIS — N186 End stage renal disease: Secondary | ICD-10-CM | POA: Diagnosis not present

## 2022-04-11 DIAGNOSIS — Z992 Dependence on renal dialysis: Secondary | ICD-10-CM | POA: Diagnosis not present

## 2022-04-11 DIAGNOSIS — D631 Anemia in chronic kidney disease: Secondary | ICD-10-CM | POA: Diagnosis not present

## 2022-04-14 DIAGNOSIS — N2581 Secondary hyperparathyroidism of renal origin: Secondary | ICD-10-CM | POA: Diagnosis not present

## 2022-04-14 DIAGNOSIS — N186 End stage renal disease: Secondary | ICD-10-CM | POA: Diagnosis not present

## 2022-04-14 DIAGNOSIS — Z992 Dependence on renal dialysis: Secondary | ICD-10-CM | POA: Diagnosis not present

## 2022-04-14 DIAGNOSIS — D631 Anemia in chronic kidney disease: Secondary | ICD-10-CM | POA: Diagnosis not present

## 2022-04-14 DIAGNOSIS — T782XXS Anaphylactic shock, unspecified, sequela: Secondary | ICD-10-CM | POA: Diagnosis not present

## 2022-04-14 NOTE — Progress Notes (Unsigned)
Chief Complaint  Patient presents with   Follow-up   HPI: [name] is a 76 y.o. female, who is here today to follow on recent visit.  Review of Systems Rest see pertinent positives and negatives per HPI.  Current Outpatient Medications on File Prior to Visit  Medication Sig Dispense Refill   aspirin EC 81 MG tablet Take 1 tablet (81 mg total) by mouth daily. Swallow whole. 30 tablet 12   atorvastatin (LIPITOR) 10 MG tablet TAKE ONE TABLET BY MOUTH ONCE DAILY 30 tablet 3   B Complex-C-Zn-Folic Acid (DIALYVITE 824 WITH ZINC) 0.8 MG TABS Take 1 tablet by mouth at bedtime.     carvedilol (COREG) 6.25 MG tablet Take 12.5 mg by mouth 2 (two) times daily. 180 tablet 3   cinacalcet (SENSIPAR) 30 MG tablet Take 30 mg by mouth every Monday, Wednesday, and Friday with hemodialysis. Post dialysis     clopidogrel (PLAVIX) 75 MG tablet Take 1 tablet (75 mg total) by mouth daily. 90 tablet 1   diltiazem (CARDIZEM) 60 MG tablet Take 1 tablet (60 mg total) by mouth 3 (three) times daily as needed (Atrial fibrillation). 30 tablet 1   ethyl chloride spray Apply 1 application topically every Monday, Wednesday, and Friday with hemodialysis.  12   famotidine (PEPCID) 20 MG tablet TAKE ONE TABLET BY MOUTH EVERYDAY AT BEDTIME 30 tablet 2   isosorbide mononitrate (IMDUR) 30 MG 24 hr tablet Take 1 tablet (30 mg total) by mouth daily. 30 tablet 1   linaclotide (LINZESS) 72 MCG capsule Take 1 capsule (72 mcg total) by mouth daily before breakfast. 30 capsule 2   Methoxy PEG-Epoetin Beta (MIRCERA IJ) Inject 1 Dose into the vein every Monday, Wednesday, and Friday with hemodialysis.     pantoprazole (PROTONIX) 40 MG tablet Take 1 tablet (40 mg total) by mouth 2 (two) times daily. 180 tablet 2   sevelamer carbonate (RENVELA) 800 MG tablet Take 1,600 mg by mouth in the morning and at bedtime. With meals take 800 mg with snack     No current facility-administered medications on file prior to visit.   Past  Medical History:  Diagnosis Date   Acid reflux    Anemia of chronic disease    Arthritis    Asthma    Atrial fibrillation ([place])    AVM (arteriovenous malformation)    Bilateral carotid bruits    Complication of anesthesia    "hard to wake up, I have sleep apnea" no CPAP   Diverticulitis    Duodenal ulcer    Dysrhythmia    Afib   ESRD (end stage renal disease) ([place])    MWF [place]   Gallbladder sludge    GI bleed    Headache    Heart murmur    Hemorrhoids    History of blood transfusion    Hypertension    Malaise and fatigue    Orthostatic hypotension    PAF (paroxysmal atrial fibrillation) ([place])    Presence of Watchman left atrial appendage closure device 02/06/2022   Watchman FLX 36m with Dr. CBurt Knack  Shortness of breath    " when I walk to fast"   Sleep apnea    Syncope    Tubulovillous adenoma of colon    Allergies  Allergen Reactions   Latex Rash   Penicillins Other (See Comments)    Yeast infection / Childhood   Sulfa Antibiotics Rash   Tape Other (See Comments)    Plastic, silicone, and  paper tape causes bruising and pulls off skin. Cloth tape works fine   Reglan [Metoclopramide] Itching    Itchiness, bug crawling sensation     Social History   Socioeconomic History   Marital status: Divorced    Spouse name: Not on file   Number of children: 4   Years of education: 14   Highest education level: Associate degree: occupational, Hotel manager, or vocational program  Occupational History   Occupation: Retired  Tobacco Use   Smoking status: Never    Passive exposure: Never   Smokeless tobacco: Never  Vaping Use   Vaping Use: Never used  Substance and Sexual Activity   Alcohol use: No   Drug use: No   Sexual activity: Never  Other Topics Concern   Not on file  Social History Narrative   HH 1   Divorced   Outpatient dialysis Mon, Wed, Fri   4 children: 1 daughter locally is an Designer, multimedia and 3 sons in [place] Determinants of  Health   Financial Resource Strain: High Risk (08/21/2021)   Overall Financial Resource Strain (CARDIA)    Difficulty of Paying Living Expenses: Very hard  Food Insecurity: Food Insecurity Present (08/21/2021)   Hunger Vital Sign    Worried About [place] in the Last Year: Often true    [place] in the Last Year: Often true  Transportation Needs: Unmet Transportation Needs (08/21/2021)   PRAPARE - Hydrologist (Medical): Yes    Lack of Transportation (Non-Medical): No  Physical Activity: Inactive (08/06/2021)   Exercise Vital Sign    Days of Exercise per Week: 0 days    Minutes of Exercise per Session: 0 min  Stress: Stress Concern Present (08/21/2021)   [place]    Feeling of Stress : Very much  Social Connections: Moderately Isolated (07/31/2020)   Social Connection and Isolation Panel [NHANES]    Frequency of Communication with Friends and Family: More than three times a week    Frequency of Social Gatherings with Friends and Family: Never    Attends Religious Services: More than 4 times per year    Active Member of [employer] or Organizations: No    Attends Archivist Meetings: Never    Marital Status: Divorced   There were no vitals filed for this visit. There is no height or weight on file to calculate BMI.  Physical Exam Vitals and nursing note reviewed.  Constitutional:      General: She is not in acute distress.    Appearance: She is well-developed.  HENT:     Head: Normocephalic and atraumatic.  Eyes:     Conjunctiva/sclera: Conjunctivae normal.  Cardiovascular:     Rate and Rhythm: Normal rate and regular rhythm.     Pulses:          Dorsalis pedis pulses are 2+ on the right side and 2+ on the left side.     Heart sounds: Murmur heard.  Pulmonary:     Effort: Pulmonary effort is normal. No respiratory distress.     Breath sounds: Normal breath sounds.   Musculoskeletal:     Left knee: No swelling, deformity, erythema or bony tenderness. Decreased range of motion. Abnormal alignment.  Skin:    General: Skin is warm.     Findings: No erythema or rash.  Neurological:     General: No focal deficit present.  Mental Status: She is alert and oriented to person, place, and time.     Cranial Nerves: No cranial nerve deficit.     Comments: In a wheel chair today.  Psychiatric:     Comments: Well groomed, good eye contact.     ASSESSMENT AND PLAN:   There are no diagnoses linked to this encounter.  No orders of the defined types were placed in this encounter.   No problem-specific Assessment & Plan notes found for this encounter.   No follow-ups on file.   Yasiel Goyne G. [name], MD  [facility]. [place] office.

## 2022-04-15 ENCOUNTER — Ambulatory Visit (INDEPENDENT_AMBULATORY_CARE_PROVIDER_SITE_OTHER): Payer: Medicare Other

## 2022-04-15 ENCOUNTER — Encounter: Payer: Self-pay | Admitting: Family Medicine

## 2022-04-15 ENCOUNTER — Telehealth: Payer: Self-pay

## 2022-04-15 ENCOUNTER — Ambulatory Visit: Payer: Medicare Other

## 2022-04-15 ENCOUNTER — Ambulatory Visit (INDEPENDENT_AMBULATORY_CARE_PROVIDER_SITE_OTHER): Payer: Medicare Other | Admitting: Family Medicine

## 2022-04-15 VITALS — BP 110/60 | HR 68 | Temp 98.2°F | Resp 16 | Ht 65.0 in | Wt 199.4 lb

## 2022-04-15 DIAGNOSIS — R0789 Other chest pain: Secondary | ICD-10-CM

## 2022-04-15 DIAGNOSIS — I6523 Occlusion and stenosis of bilateral carotid arteries: Secondary | ICD-10-CM | POA: Diagnosis not present

## 2022-04-15 DIAGNOSIS — S8992XD Unspecified injury of left lower leg, subsequent encounter: Secondary | ICD-10-CM | POA: Diagnosis not present

## 2022-04-15 DIAGNOSIS — Z96652 Presence of left artificial knee joint: Secondary | ICD-10-CM | POA: Diagnosis not present

## 2022-04-15 DIAGNOSIS — W19XXXD Unspecified fall, subsequent encounter: Secondary | ICD-10-CM

## 2022-04-15 DIAGNOSIS — I129 Hypertensive chronic kidney disease with stage 1 through stage 4 chronic kidney disease, or unspecified chronic kidney disease: Secondary | ICD-10-CM

## 2022-04-15 DIAGNOSIS — I4811 Longstanding persistent atrial fibrillation: Secondary | ICD-10-CM

## 2022-04-15 DIAGNOSIS — Z23 Encounter for immunization: Secondary | ICD-10-CM | POA: Diagnosis not present

## 2022-04-15 DIAGNOSIS — Z95818 Presence of other cardiac implants and grafts: Secondary | ICD-10-CM

## 2022-04-15 DIAGNOSIS — S8992XA Unspecified injury of left lower leg, initial encounter: Secondary | ICD-10-CM | POA: Diagnosis not present

## 2022-04-15 NOTE — Telephone Encounter (Signed)
Discussed with Dr. Burt Knack. Instead of restarting Coumadin, the patient will remain on ASA and Plavix and have repeat CT in 3 months to reassess.  Discussed results with patient. She will continue Plavix/ASA until instructed to stop.  She has appointment with Dr. Einar Gip 12/28. Will request labs drawn at that office and will arrange CT after visit. She was grateful for call and agrees with plan.  Per request, will call and arrange CT with her daughter, [name] who drives her.

## 2022-04-15 NOTE — Patient Instructions (Addendum)
A few things to remember from today's visit:  Injury of left knee, subsequent encounter - Plan: DG Knee Complete 4 Views Left  Fall, subsequent encounter - Plan: DG Knee Complete 4 Views Left  Hypertension with renal disease  Chest tightness  Please call your orthopedist. Fall precautions. No changes today. Continue monitoring blood pressure and keep appt with DR Einar Gip.You may need to see him sooner of chest pain continues.  If you need refills for medications you take chronically, please call your pharmacy. Do not use My Chart to request refills or for acute issues that need immediate attention. If you send a my chart message, it may take a few days to be addressed, specially if I am not in the office.  Please be sure medication list is accurate. If a new problem present, please set up appointment sooner than planned today.

## 2022-04-16 ENCOUNTER — Telehealth: Payer: Self-pay | Admitting: Family Medicine

## 2022-04-16 DIAGNOSIS — D631 Anemia in chronic kidney disease: Secondary | ICD-10-CM | POA: Diagnosis not present

## 2022-04-16 DIAGNOSIS — N186 End stage renal disease: Secondary | ICD-10-CM | POA: Diagnosis not present

## 2022-04-16 DIAGNOSIS — T782XXS Anaphylactic shock, unspecified, sequela: Secondary | ICD-10-CM | POA: Diagnosis not present

## 2022-04-16 DIAGNOSIS — Z992 Dependence on renal dialysis: Secondary | ICD-10-CM | POA: Diagnosis not present

## 2022-04-16 DIAGNOSIS — N2581 Secondary hyperparathyroidism of renal origin: Secondary | ICD-10-CM | POA: Diagnosis not present

## 2022-04-16 NOTE — Telephone Encounter (Signed)
Because she could move knee with no pain, flexion and extension and swelling improved, I do not think it is a fracture. I could order a CT of the knee but it will be faster for her to see her orthopedist. Thanks, BJ

## 2022-04-16 NOTE — Telephone Encounter (Signed)
ERROR

## 2022-04-16 NOTE — Telephone Encounter (Signed)
Further eval with a CT scan is recommended based on results of left knee imaging done yesterday

## 2022-04-16 NOTE — Telephone Encounter (Signed)
Discussed with patient. She will stay on same meds and will NOT have a repeat CT in 3 months (as confirmed with Dr. Burt Knack).  She understands to call if she needs anything and states she does not need Dr. Burt Knack to call again.  She was grateful for assistance.

## 2022-04-16 NOTE — Telephone Encounter (Signed)
I called and spoke with patient. She is still going to see her orthopedist next week. She did mention that she isn't able to move it as well today if at all. She will go see her ortho next week to see what he says & keep [place] updated. (They may be able to get her scheduled for a CT quicker than we can.)

## 2022-04-17 ENCOUNTER — Telehealth: Payer: Self-pay | Admitting: Cardiovascular Disease

## 2022-04-17 ENCOUNTER — Telehealth: Payer: Self-pay | Admitting: Physician Assistant

## 2022-04-17 NOTE — Telephone Encounter (Signed)
Spoke with patient's daughter.  Explained that the 2 mm gap is within acceptable criteria for discontinuation of anticoagulation.  The patient will continue on aspirin and clopidogrel.  All of her questions were answered to her satisfaction.  She will call back if any other questions arise.

## 2022-04-17 NOTE — Telephone Encounter (Signed)
  [facility]   MULTIDISCIPLINARY HEART VALVE TEAM   Patent's daughter called concerned about the 93m gap around her mothers watchman device. She doesn't feel comfortable traveling with her without assurance that this is not going to cause he problems. I went over Dr. CAntionette Charnote with her that he feels the 262mgap meets criteria for [facility] Center[facility]continuation but she would like to hear from Dr. CoBurt Knackersonally. Will forward this message to him.  Daughter: JaMarcella [phone] KaAngelena FormA-C  [facility]

## 2022-04-17 NOTE — Telephone Encounter (Signed)
Follow Up:      I did not need this encounter

## 2022-04-18 DIAGNOSIS — N186 End stage renal disease: Secondary | ICD-10-CM | POA: Diagnosis not present

## 2022-04-18 DIAGNOSIS — N2581 Secondary hyperparathyroidism of renal origin: Secondary | ICD-10-CM | POA: Diagnosis not present

## 2022-04-18 DIAGNOSIS — T782XXS Anaphylactic shock, unspecified, sequela: Secondary | ICD-10-CM | POA: Diagnosis not present

## 2022-04-18 DIAGNOSIS — D631 Anemia in chronic kidney disease: Secondary | ICD-10-CM | POA: Diagnosis not present

## 2022-04-18 DIAGNOSIS — Z992 Dependence on renal dialysis: Secondary | ICD-10-CM | POA: Diagnosis not present

## 2022-04-18 NOTE — Telephone Encounter (Signed)
Provider ordered stat knee CT.

## 2022-04-20 ENCOUNTER — Ambulatory Visit (HOSPITAL_BASED_OUTPATIENT_CLINIC_OR_DEPARTMENT_OTHER)
Admission: RE | Admit: 2022-04-20 | Discharge: 2022-04-20 | Disposition: A | Discharge status: Home / Self Care | Payer: Medicare Other | Source: Ambulatory Visit | Attending: Family Medicine | Admitting: Family Medicine

## 2022-04-20 ENCOUNTER — Ambulatory Visit (HOSPITAL_BASED_OUTPATIENT_CLINIC_OR_DEPARTMENT_OTHER): Payer: Medicare Other

## 2022-04-20 DIAGNOSIS — N186 End stage renal disease: Secondary | ICD-10-CM | POA: Diagnosis not present

## 2022-04-20 DIAGNOSIS — M25562 Pain in left knee: Secondary | ICD-10-CM | POA: Diagnosis not present

## 2022-04-20 DIAGNOSIS — N2581 Secondary hyperparathyroidism of renal origin: Secondary | ICD-10-CM | POA: Diagnosis not present

## 2022-04-20 DIAGNOSIS — D509 Iron deficiency anemia, unspecified: Secondary | ICD-10-CM | POA: Diagnosis not present

## 2022-04-20 DIAGNOSIS — D631 Anemia in chronic kidney disease: Secondary | ICD-10-CM | POA: Diagnosis not present

## 2022-04-20 DIAGNOSIS — S8992XD Unspecified injury of left lower leg, subsequent encounter: Secondary | ICD-10-CM | POA: Diagnosis not present

## 2022-04-20 DIAGNOSIS — Z992 Dependence on renal dialysis: Secondary | ICD-10-CM | POA: Diagnosis not present

## 2022-04-20 DIAGNOSIS — I158 Other secondary hypertension: Secondary | ICD-10-CM | POA: Diagnosis not present

## 2022-04-21 ENCOUNTER — Telehealth: Payer: Self-pay | Admitting: Nurse Practitioner

## 2022-04-21 DIAGNOSIS — D509 Iron deficiency anemia, unspecified: Secondary | ICD-10-CM | POA: Diagnosis not present

## 2022-04-21 DIAGNOSIS — N186 End stage renal disease: Secondary | ICD-10-CM | POA: Diagnosis not present

## 2022-04-21 DIAGNOSIS — Z992 Dependence on renal dialysis: Secondary | ICD-10-CM | POA: Diagnosis not present

## 2022-04-21 DIAGNOSIS — N2581 Secondary hyperparathyroidism of renal origin: Secondary | ICD-10-CM | POA: Diagnosis not present

## 2022-04-21 DIAGNOSIS — D631 Anemia in chronic kidney disease: Secondary | ICD-10-CM | POA: Diagnosis not present

## 2022-04-21 NOTE — Telephone Encounter (Signed)
Inbound call from patient stating that she is having issues again with her acid reflux issues. Patient is requesting a call back to discuss. Patient is also due for EGD in November. Please advise.

## 2022-04-22 ENCOUNTER — Other Ambulatory Visit: Payer: Self-pay

## 2022-04-22 ENCOUNTER — Encounter: Payer: Self-pay | Admitting: Physical Medicine & Rehabilitation

## 2022-04-22 ENCOUNTER — Encounter: Payer: Medicare Other | Attending: Physical Medicine & Rehabilitation | Admitting: Physical Medicine & Rehabilitation

## 2022-04-22 VITALS — BP 170/76 | HR 77 | Ht 65.0 in | Wt 196.6 lb

## 2022-04-22 DIAGNOSIS — M25561 Pain in right knee: Secondary | ICD-10-CM | POA: Insufficient documentation

## 2022-04-22 DIAGNOSIS — G8929 Other chronic pain: Secondary | ICD-10-CM | POA: Insufficient documentation

## 2022-04-22 DIAGNOSIS — Z96652 Presence of left artificial knee joint: Secondary | ICD-10-CM | POA: Diagnosis not present

## 2022-04-22 MED ORDER — BETAMETHASONE SOD PHOS & ACET 6 (3-3) MG/ML IJ SUSP: 12.0000 mg | Freq: Once | INTRAMUSCULAR | Status: DC

## 2022-04-22 MED ORDER — LIDOCAINE HCL 1 % IJ SOLN: 4.0000 mL | Freq: Once | INTRAMUSCULAR | Status: AC

## 2022-04-22 NOTE — Telephone Encounter (Signed)
Pt was made aware of Carl Best NP recommendations: Pt verbalized understanding with all questions answered.

## 2022-04-22 NOTE — Telephone Encounter (Signed)
Pt states that she is having reflux and heartburn that has been progressive over the last three weeks. Pt stated that originally it started  at night and now is progressed to throughout the day as well: Pt states that she has researched the correct foods to eat and is adhering to a strict diet and taking her meds as prescribed: Protonix 40 mg twice a day and pepcid 20 mg at night:   Please advise

## 2022-04-22 NOTE — Progress Notes (Signed)
Knee injection Without  ultrasound guidance  Indication:Right Knee pain not relieved by medication management and other conservative care.  Informed consent was obtained after describing risks and benefits of the procedure with the patient, this includes bleeding, bruising, infection and medication side effects. The patient wishes to proceed and has given written consent. The patient was placed in a recumbent position. The medial aspect of the knee was marked and prepped with Betadine and alcohol. It was then entered with a 25-gauge 1-1/2 inch needle needle was inserted into the knee joint. After negative draw back for blood, a solution containing one ML of '6mg'$  per mL betamethasone and 3 mL of 1% lidocaine were injected. The patient tolerated the procedure well. Post procedure instructions were given.

## 2022-04-22 NOTE — Telephone Encounter (Signed)
Remo Lipps, patient can increase Famotidine '20mg'$  to bid. Add Gaviscon 1 tbsp tid PRN as needed. Patient to provide further sx update in one week, sooner if sx worsen. Thx

## 2022-04-23 DIAGNOSIS — N186 End stage renal disease: Secondary | ICD-10-CM | POA: Diagnosis not present

## 2022-04-23 DIAGNOSIS — D631 Anemia in chronic kidney disease: Secondary | ICD-10-CM | POA: Diagnosis not present

## 2022-04-23 DIAGNOSIS — D509 Iron deficiency anemia, unspecified: Secondary | ICD-10-CM | POA: Diagnosis not present

## 2022-04-23 DIAGNOSIS — Z992 Dependence on renal dialysis: Secondary | ICD-10-CM | POA: Diagnosis not present

## 2022-04-23 DIAGNOSIS — N2581 Secondary hyperparathyroidism of renal origin: Secondary | ICD-10-CM | POA: Diagnosis not present

## 2022-04-25 DIAGNOSIS — N186 End stage renal disease: Secondary | ICD-10-CM | POA: Diagnosis not present

## 2022-04-25 DIAGNOSIS — N2581 Secondary hyperparathyroidism of renal origin: Secondary | ICD-10-CM | POA: Diagnosis not present

## 2022-04-25 DIAGNOSIS — I10 Essential (primary) hypertension: Secondary | ICD-10-CM | POA: Diagnosis not present

## 2022-04-25 DIAGNOSIS — D631 Anemia in chronic kidney disease: Secondary | ICD-10-CM | POA: Diagnosis not present

## 2022-04-25 DIAGNOSIS — D509 Iron deficiency anemia, unspecified: Secondary | ICD-10-CM | POA: Diagnosis not present

## 2022-04-25 DIAGNOSIS — Z992 Dependence on renal dialysis: Secondary | ICD-10-CM | POA: Diagnosis not present

## 2022-04-28 DIAGNOSIS — N2581 Secondary hyperparathyroidism of renal origin: Secondary | ICD-10-CM | POA: Diagnosis not present

## 2022-04-28 DIAGNOSIS — Z992 Dependence on renal dialysis: Secondary | ICD-10-CM | POA: Diagnosis not present

## 2022-04-28 DIAGNOSIS — N186 End stage renal disease: Secondary | ICD-10-CM | POA: Diagnosis not present

## 2022-04-28 DIAGNOSIS — D631 Anemia in chronic kidney disease: Secondary | ICD-10-CM | POA: Diagnosis not present

## 2022-04-28 DIAGNOSIS — D509 Iron deficiency anemia, unspecified: Secondary | ICD-10-CM | POA: Diagnosis not present

## 2022-04-29 ENCOUNTER — Encounter: Payer: Self-pay | Admitting: Orthopaedic Surgery

## 2022-04-29 ENCOUNTER — Ambulatory Visit: Payer: Self-pay

## 2022-04-29 ENCOUNTER — Ambulatory Visit: Payer: Medicare Other | Admitting: Family Medicine

## 2022-04-29 ENCOUNTER — Ambulatory Visit (INDEPENDENT_AMBULATORY_CARE_PROVIDER_SITE_OTHER): Payer: Medicare Other | Admitting: Orthopaedic Surgery

## 2022-04-29 DIAGNOSIS — M25561 Pain in right knee: Secondary | ICD-10-CM | POA: Diagnosis not present

## 2022-04-29 DIAGNOSIS — I6523 Occlusion and stenosis of bilateral carotid arteries: Secondary | ICD-10-CM

## 2022-04-29 NOTE — Progress Notes (Signed)
Office Visit Note   Patient: Jane [name]           Date of Birth: Jan 15, 1946           MRN: [id number] Visit Date: 04/29/2022              Requested by: Jane Blake, MD [address] [place],  [place] [address] PCP: Jane [name], [name], MD   Assessment & Plan: Visit Diagnoses:  1. Right knee pain, unspecified chronicity     Plan: Impression is left knee pain status post fall.  Unclear as to exactly what is going on.  Imaging is reassuring that there is no acute abnormalities.  Recommend symptomatic treatment and I would expect this to improve.  Hinged knee brace provided today.  Over-the-counter medications as needed.  Follow-up as needed.  I gave her my card.  Follow-Up Instructions: No follow-ups on file.   Orders:  No orders of the defined types were placed in this encounter.  No orders of the defined types were placed in this encounter.     Procedures: No procedures performed   Clinical Data: No additional findings.   Subjective: Chief Complaint  Patient presents with   Right Knee - Pain    HPI Jane [name] is a very pleasant 76 year old female here for acute left knee pain.  Had a mechanical fall onto her left knee on September 15.  X-rays and CT scans were negative for fracture.  Saw Jane [name] recently who referred her here.  He did a cortisone injection which gave temporary relief.  She has pain to the front of the knee worse with weightbearing.  She has been using a rollator.  Pain has slightly improved.  Denies any numbness and tingling.  Review of Systems  Constitutional: Negative.   HENT: Negative.    Eyes: Negative.   Respiratory: Negative.    Cardiovascular: Negative.   Endocrine: Negative.   Musculoskeletal: Negative.   Neurological: Negative.   Hematological: Negative.   Psychiatric/Behavioral: Negative.    All other systems reviewed and are negative.    Objective: Vital Signs: There were no vitals taken for this  visit.  Physical Exam Vitals and nursing note reviewed.  Constitutional:      Appearance: She is well-developed.  HENT:     Head: Atraumatic.     Nose: Nose normal.  Eyes:     Extraocular Movements: Extraocular movements intact.  Cardiovascular:     Pulses: Normal pulses.  Pulmonary:     Effort: Pulmonary effort is normal.  Abdominal:     Palpations: Abdomen is soft.  Musculoskeletal:     Cervical back: Neck supple.  Skin:    General: Skin is warm.     Capillary Refill: Capillary refill takes less than 2 seconds.  Neurological:     Mental Status: She is alert. Mental status is at baseline.  Psychiatric:        Behavior: Behavior normal.        Thought Content: Thought content normal.        Judgment: Judgment normal.     Ortho Exam Examination of the left knee shows a fully healed surgical scar.  For the most part she has good range of motion.  She has some pain behind the patella with knee flexion and with blotting of the patella.  No joint line tenderness.  Collaterals are stable.  No joint effusion. Specialty Comments:  No specialty comments available.  Imaging: No results found.  PMFS History: Patient Active Problem List   Diagnosis Date Noted   Presence of Watchman left atrial appendage closure device 02/06/2022   Atrial fibrillation ([place]) 02/06/2022   Polyp of transverse colon    AVM (arteriovenous malformation) of colon, acquired with hemorrhage    Symptomatic anemia 09/26/2021   Gastritis and gastroduodenitis    Hematochezia    OSA (obstructive sleep apnea) 07/19/2021   Atherosclerosis of aorta ([place]) 05/13/2021   Unintentional weight loss 05/08/2021   Dysphagia 05/08/2021   Chronic left sacroiliac pain 05/07/2021   GI bleed 01/31/2021   Gastric intestinal metaplasia 10/18/2020   Dyspepsia 10/18/2020   Chronic constipation 10/18/2020   Nausea without vomiting 05/29/2020   Generalized osteoarthritis of multiple sites 03/27/2020   Chronic pain  disorder 03/27/2020   Acute GI bleeding 01/13/2020   Duodenal ulcer with hemorrhage    Lower GI bleed 01/12/2020   Intestinal metaplasia of gastric mucosa 12/04/2019   Gastric polyp 12/04/2019   Tubulovillous adenoma of small intestine 12/03/2019   Adenomatous duodenal polyp 12/03/2019   Abnormal endoscopy of upper gastrointestinal tract 12/03/2019   Depression, major, recurrent, moderate (HCC) 09/22/2018   Orthostatic hypotension 09/16/2018   Bilateral lower extremity edema 08/24/2018   Abdominal pain, diffuse 05/11/2018   Unilateral primary osteoarthritis, right knee 03/18/2018   Chronic pain of right knee 03/18/2018   Abnormal facial hair 11/10/2017   Chronic anticoagulation 11/10/2017   GERD (gastroesophageal reflux disease) 06/16/2017   Constipation 10/16/2016   Diverticulitis of large intestine with perforation 06/26/2016   Anemia in other chronic diseases classified elsewhere 06/25/2016   Chest pain with moderate risk for cardiac etiology 06/25/2016   ESRD on hemodialysis ([place]) 06/30/2012   Hypertension with renal disease 06/30/2012   Past Medical History:  Diagnosis Date   Acid reflux    Anemia of chronic disease    Arthritis    Asthma    Atrial fibrillation (HCC)    AVM (arteriovenous malformation)    Bilateral carotid bruits    Complication of anesthesia    "hard to wake up, I have sleep apnea" no CPAP   Diverticulitis    Duodenal ulcer    Dysrhythmia    Afib   ESRD (end stage renal disease) ([place])    MWF [facility]   Gallbladder sludge    GI bleed    Headache    Heart murmur    Hemorrhoids    History of blood transfusion    Hypertension    Malaise and fatigue    Orthostatic hypotension    PAF (paroxysmal atrial fibrillation) ([place])    Presence of Watchman left atrial appendage closure device 02/06/2022   Watchman FLX 60m with Dr. CBurt Knack  Shortness of breath    " when I walk to fast"   Sleep apnea    Syncope    Tubulovillous adenoma of colon      Family History  Problem Relation Age of Onset   Heart failure Mother    Stroke Mother    Other Father    Colon cancer Neg Hx    Liver disease Neg Hx    Esophageal cancer Neg Hx    Stomach cancer Neg Hx    Inflammatory bowel disease Neg Hx    Rectal cancer Neg Hx    Pancreatic cancer Neg Hx     Past Surgical History:  Procedure Laterality Date   A/V FISTULAGRAM Right 10/18/2020   Procedure: A/V FISTULAGRAM;  Surgeon: CMarty Heck MD;  [facility];  Service: Cardiovascular;  Laterality: Right;   A/V FISTULAGRAM Right 09/05/2021   Procedure: A/V Fistulagram;  Surgeon: Marty Heck, MD;  Location: [place] CV LAB;  Service: Cardiovascular;  Laterality: Right;   AV FISTULA PLACEMENT     BACK SURGERY     Lumbar fusion L 4 and L 5   BIOPSY  01/09/2020   Procedure: BIOPSY;  Surgeon: Rush Landmark Telford Nab., MD;  Location: [place];  Service: Gastroenterology;;   BIOPSY  01/31/2021   Procedure: BIOPSY;  Surgeon: Irving Copas., MD;  Location: [place];  Service: Gastroenterology;;   BIOPSY  09/26/2021   Procedure: BIOPSY;  Surgeon: Thornton Park, MD;  Location: [place];  Service: Gastroenterology;;   COLONOSCOPY     COLONOSCOPY N/A 02/01/2021   Procedure: COLONOSCOPY;  Surgeon: Yetta Flock, MD;  Location: [place];  Service: Gastroenterology;  Laterality: N/A;   COLONOSCOPY WITH PROPOFOL N/A 09/26/2021   Procedure: COLONOSCOPY WITH PROPOFOL;  Surgeon: Thornton Park, MD;  Location: [place];  Service: Gastroenterology;  Laterality: N/A;   COLONOSCOPY WITH PROPOFOL N/A 09/29/2021   Procedure: COLONOSCOPY WITH PROPOFOL;  Surgeon: Thornton Park, MD;  Location: [place];  Service: Gastroenterology;  Laterality: N/A;   COLONOSCOPY WITH PROPOFOL N/A 10/01/2021   Procedure: COLONOSCOPY WITH PROPOFOL;  Surgeon: Mauri Pole, MD;  Location: [place];  Service: Gastroenterology;  Laterality: N/A;    ENDOSCOPIC MUCOSAL RESECTION N/A 01/09/2020   Procedure: ENDOSCOPIC MUCOSAL RESECTION;  Surgeon: Rush Landmark Telford Nab., MD;  Location: [place];  Service: Gastroenterology;  Laterality: N/A;   ENDOSCOPIC MUCOSAL RESECTION N/A 01/31/2021   Procedure: ENDOSCOPIC MUCOSAL RESECTION;  Surgeon: Rush Landmark Telford Nab., MD;  Location: [place];  Service: Gastroenterology;  Laterality: N/A;   ENTEROSCOPY N/A 09/26/2021   Procedure: ENTEROSCOPY;  Surgeon: Thornton Park, MD;  Location: [place];  Service: Gastroenterology;  Laterality: N/A;   ESOPHAGOGASTRODUODENOSCOPY     ESOPHAGOGASTRODUODENOSCOPY (EGD) WITH PROPOFOL N/A 01/09/2020   Procedure: ESOPHAGOGASTRODUODENOSCOPY (EGD) WITH PROPOFOL;  Surgeon: Rush Landmark Telford Nab., MD;  Location: [place];  Service: Gastroenterology;  Laterality: N/A;   ESOPHAGOGASTRODUODENOSCOPY (EGD) WITH PROPOFOL N/A 01/13/2020   Procedure: ESOPHAGOGASTRODUODENOSCOPY (EGD) WITH PROPOFOL;  Surgeon: Irene Shipper, MD;  Location: [place];  Service: Gastroenterology;  Laterality: N/A;   ESOPHAGOGASTRODUODENOSCOPY (EGD) WITH PROPOFOL N/A 01/31/2021   Procedure: ESOPHAGOGASTRODUODENOSCOPY (EGD) WITH PROPOFOL;  Surgeon: Rush Landmark Telford Nab., MD;  Location: [place];  Service: Gastroenterology;  Laterality: N/A;   EUS N/A 01/09/2020   Procedure: UPPER ENDOSCOPIC ULTRASOUND (EUS) RADIAL;  Surgeon: Irving Copas., MD;  Location: [place];  Service: Gastroenterology;  Laterality: N/A;   GIVENS CAPSULE STUDY  09/29/2021   Procedure: GIVENS CAPSULE STUDY;  Surgeon: Thornton Park, MD;  Location: [place];  Service: Gastroenterology;;   HEMOSTASIS CLIP PLACEMENT  01/09/2020   Procedure: HEMOSTASIS CLIP PLACEMENT;  Surgeon: Irving Copas., MD;  Location: [place];  Service: Gastroenterology;;   HEMOSTASIS CLIP PLACEMENT  01/31/2021   Procedure: HEMOSTASIS CLIP PLACEMENT;  Surgeon: Irving Copas., MD;  Location: [place];   Service: Gastroenterology;;   HEMOSTASIS CONTROL  01/13/2020   Procedure: HEMOSTASIS CONTROL;  Surgeon: Irene Shipper, MD;  Location: [place];  Service: Gastroenterology;;  hemaspray   HOT HEMOSTASIS N/A 01/13/2020   Procedure: HOT HEMOSTASIS (ARGON PLASMA COAGULATION/BICAP);  Surgeon: Irene Shipper, MD;  Location: [place];  Service: Gastroenterology;  Laterality: N/A;   HOT HEMOSTASIS N/A 02/01/2021   Procedure: HOT HEMOSTASIS (ARGON PLASMA COAGULATION/BICAP);  Surgeon:  Armbruster, Carlota Raspberry, MD;  Location: [place];  Service: Gastroenterology;  Laterality: N/A;   HOT HEMOSTASIS N/A 10/01/2021   Procedure: HOT HEMOSTASIS (ARGON PLASMA COAGULATION/BICAP);  Surgeon: Mauri Pole, MD;  Location: [place];  Service: Gastroenterology;  Laterality: N/A;   IR GENERIC HISTORICAL  07/09/2016   IR US GUIDE VASC ACCESS RIGHT 07/09/2016 Arne Cleveland, MD [facility]   IR GENERIC HISTORICAL  07/09/2016   IR FLUORO GUIDE CV LINE RIGHT 07/09/2016 Arne Cleveland, MD [facility]   KNEE ARTHROPLASTY Left    LAPAROSCOPIC SIGMOID COLECTOMY N/A 07/11/2016   Procedure: LAPAROSCOPIC SIGMOID COLECTOMY;  Surgeon: Clovis Riley, MD;  Location: [place];  Service: General;  Laterality: N/A;   LEFT ATRIAL APPENDAGE OCCLUSION N/A 02/06/2022   Procedure: LEFT ATRIAL APPENDAGE OCCLUSION;  Surgeon: Sherren Mocha, MD;  Location: [place] CV LAB;  Service: Cardiovascular;  Laterality: N/A;   MASS EXCISION Right 04/10/2020   Procedure: EXCISION SKIN NODULE RIGHT FOREARM;  Surgeon: Waynetta Sandy, MD;  Location: [place];  Service: Vascular;  Laterality: Right;   POLYPECTOMY  09/26/2021   Procedure: POLYPECTOMY;  Surgeon: Thornton Park, MD;  Location: [place];  Service: Gastroenterology;;   POLYPECTOMY  10/01/2021   Procedure: POLYPECTOMY;  Surgeon: Mauri Pole, MD;  Location: [place];  Service: Gastroenterology;;   Clide Deutscher  01/09/2020   Procedure: Clide Deutscher;   Surgeon: Irving Copas., MD;  Location: [place];  Service: Gastroenterology;;   Clide Deutscher  01/13/2020   Procedure: Clide Deutscher;  Surgeon: Irene Shipper, MD;  Location: [place];  Service: Gastroenterology;;   Maryagnes Amos INJECTION  01/09/2020   Procedure: SUBMUCOSAL LIFTING INJECTION;  Surgeon: Irving Copas., MD;  Location: [place];  Service: Gastroenterology;;   TEE WITHOUT CARDIOVERSION N/A 02/06/2022   Procedure: TRANSESOPHAGEAL ECHOCARDIOGRAM (TEE);  Surgeon: Sherren Mocha, MD;  Location: [place] CV LAB;  Service: Cardiovascular;  Laterality: N/A;   TUBAL LIGATION     Social History   Occupational History   Occupation: Retired  Tobacco Use   Smoking status: Never    Passive exposure: Never   Smokeless tobacco: Never  Vaping Use   Vaping Use: Never used  Substance and Sexual Activity   Alcohol use: No   Drug use: No   Sexual activity: Never

## 2022-04-30 DIAGNOSIS — N2581 Secondary hyperparathyroidism of renal origin: Secondary | ICD-10-CM | POA: Diagnosis not present

## 2022-04-30 DIAGNOSIS — D631 Anemia in chronic kidney disease: Secondary | ICD-10-CM | POA: Diagnosis not present

## 2022-04-30 DIAGNOSIS — I4891 Unspecified atrial fibrillation: Secondary | ICD-10-CM | POA: Diagnosis not present

## 2022-04-30 DIAGNOSIS — E1129 Type 2 diabetes mellitus with other diabetic kidney complication: Secondary | ICD-10-CM | POA: Diagnosis not present

## 2022-04-30 DIAGNOSIS — Z992 Dependence on renal dialysis: Secondary | ICD-10-CM | POA: Diagnosis not present

## 2022-04-30 DIAGNOSIS — N186 End stage renal disease: Secondary | ICD-10-CM | POA: Diagnosis not present

## 2022-04-30 DIAGNOSIS — D509 Iron deficiency anemia, unspecified: Secondary | ICD-10-CM | POA: Diagnosis not present

## 2022-05-02 DIAGNOSIS — N186 End stage renal disease: Secondary | ICD-10-CM | POA: Diagnosis not present

## 2022-05-02 DIAGNOSIS — Z992 Dependence on renal dialysis: Secondary | ICD-10-CM | POA: Diagnosis not present

## 2022-05-02 DIAGNOSIS — N2581 Secondary hyperparathyroidism of renal origin: Secondary | ICD-10-CM | POA: Diagnosis not present

## 2022-05-02 DIAGNOSIS — D631 Anemia in chronic kidney disease: Secondary | ICD-10-CM | POA: Diagnosis not present

## 2022-05-02 DIAGNOSIS — D509 Iron deficiency anemia, unspecified: Secondary | ICD-10-CM | POA: Diagnosis not present

## 2022-05-05 DIAGNOSIS — D631 Anemia in chronic kidney disease: Secondary | ICD-10-CM | POA: Diagnosis not present

## 2022-05-05 DIAGNOSIS — N2581 Secondary hyperparathyroidism of renal origin: Secondary | ICD-10-CM | POA: Diagnosis not present

## 2022-05-05 DIAGNOSIS — D509 Iron deficiency anemia, unspecified: Secondary | ICD-10-CM | POA: Diagnosis not present

## 2022-05-05 DIAGNOSIS — N186 End stage renal disease: Secondary | ICD-10-CM | POA: Diagnosis not present

## 2022-05-05 DIAGNOSIS — Z992 Dependence on renal dialysis: Secondary | ICD-10-CM | POA: Diagnosis not present

## 2022-05-06 ENCOUNTER — Other Ambulatory Visit: Payer: Self-pay

## 2022-05-06 DIAGNOSIS — K219 Gastro-esophageal reflux disease without esophagitis: Secondary | ICD-10-CM

## 2022-05-06 MED ORDER — FAMOTIDINE 40 MG PO TABS: 40.0000 mg | ORAL_TABLET | Freq: Two times a day (BID) | ORAL | 1 refills | Status: DC

## 2022-05-06 MED ORDER — OMEPRAZOLE 40 MG PO CPDR: 40.0000 mg | DELAYED_RELEASE_CAPSULE | Freq: Two times a day (BID) | ORAL | 1 refills | Status: DC

## 2022-05-06 NOTE — Telephone Encounter (Signed)
Pt stated that her heartburn is not any better and she is doing everything the Dr has told her: She is eating a strict diet. Taking Pepcid twice a day, Protonix twice a day. Gaviscon 1 tbsp before meals. Pt states that she eats ice chips to calm the burning sensation and is affecting her dialysis as she is getting to much fluid: Please advise

## 2022-05-06 NOTE — Telephone Encounter (Signed)
Inbound call from patient states her acid reflux is getting worse, does not feel the rx previously given her has been helping. Patient is requesting a call back to further advise.

## 2022-05-06 NOTE — Telephone Encounter (Signed)
Pt was made aware of Carl Best recommendations:  Protonix discontinued: Pt made aware  Prescription sent for  Omeprazole '40mg'$  po bid disp # 60, 1 refill.  Famotidine to '40mg'$  bid 1 refill: Pt made aware: Pt stated that she has not been on steroids or antibiotics: Pt scheduled for an office visit with on 05/23/2022 at 1:30 PM  with Carl Best NP: Pt made aware. Pt verbalized understanding with all questions answered.

## 2022-05-06 NOTE — Telephone Encounter (Signed)
Jane [name], she has a complex medical hx. Pls schedule her for an office appt within the next 1 to 2 weeks. Stop Pantoprazole '40mg'$  bid and switch her to Omeprazole '40mg'$  po bid disp # 60, 1 refill. She can increase Famotidine to '40mg'$  bid. Pls verify if she has been on any steroids or antibiotics recently.  If no improvement, we can try Carafate. To discuss repeat EGD with possible ph study at time of follow up. I am adding Dr. Hilarie Fredrickson to this msg so he can add any other recommendations at this time. THX.

## 2022-05-07 DIAGNOSIS — N2581 Secondary hyperparathyroidism of renal origin: Secondary | ICD-10-CM | POA: Diagnosis not present

## 2022-05-07 DIAGNOSIS — Z992 Dependence on renal dialysis: Secondary | ICD-10-CM | POA: Diagnosis not present

## 2022-05-07 DIAGNOSIS — D509 Iron deficiency anemia, unspecified: Secondary | ICD-10-CM | POA: Diagnosis not present

## 2022-05-07 DIAGNOSIS — D631 Anemia in chronic kidney disease: Secondary | ICD-10-CM | POA: Diagnosis not present

## 2022-05-07 DIAGNOSIS — N186 End stage renal disease: Secondary | ICD-10-CM | POA: Diagnosis not present

## 2022-05-09 DIAGNOSIS — N186 End stage renal disease: Secondary | ICD-10-CM | POA: Diagnosis not present

## 2022-05-09 DIAGNOSIS — N2581 Secondary hyperparathyroidism of renal origin: Secondary | ICD-10-CM | POA: Diagnosis not present

## 2022-05-09 DIAGNOSIS — D509 Iron deficiency anemia, unspecified: Secondary | ICD-10-CM | POA: Diagnosis not present

## 2022-05-09 DIAGNOSIS — D631 Anemia in chronic kidney disease: Secondary | ICD-10-CM | POA: Diagnosis not present

## 2022-05-09 DIAGNOSIS — Z992 Dependence on renal dialysis: Secondary | ICD-10-CM | POA: Diagnosis not present

## 2022-05-12 DIAGNOSIS — N2581 Secondary hyperparathyroidism of renal origin: Secondary | ICD-10-CM | POA: Diagnosis not present

## 2022-05-12 DIAGNOSIS — N186 End stage renal disease: Secondary | ICD-10-CM | POA: Diagnosis not present

## 2022-05-12 DIAGNOSIS — D509 Iron deficiency anemia, unspecified: Secondary | ICD-10-CM | POA: Diagnosis not present

## 2022-05-12 DIAGNOSIS — D631 Anemia in chronic kidney disease: Secondary | ICD-10-CM | POA: Diagnosis not present

## 2022-05-12 DIAGNOSIS — Z992 Dependence on renal dialysis: Secondary | ICD-10-CM | POA: Diagnosis not present

## 2022-05-13 ENCOUNTER — Ambulatory Visit: Payer: Medicare Other | Admitting: Family Medicine

## 2022-05-14 DIAGNOSIS — D509 Iron deficiency anemia, unspecified: Secondary | ICD-10-CM | POA: Diagnosis not present

## 2022-05-14 DIAGNOSIS — Z992 Dependence on renal dialysis: Secondary | ICD-10-CM | POA: Diagnosis not present

## 2022-05-14 DIAGNOSIS — N186 End stage renal disease: Secondary | ICD-10-CM | POA: Diagnosis not present

## 2022-05-14 DIAGNOSIS — D631 Anemia in chronic kidney disease: Secondary | ICD-10-CM | POA: Diagnosis not present

## 2022-05-14 DIAGNOSIS — N2581 Secondary hyperparathyroidism of renal origin: Secondary | ICD-10-CM | POA: Diagnosis not present

## 2022-05-16 DIAGNOSIS — N2581 Secondary hyperparathyroidism of renal origin: Secondary | ICD-10-CM | POA: Diagnosis not present

## 2022-05-16 DIAGNOSIS — Z992 Dependence on renal dialysis: Secondary | ICD-10-CM | POA: Diagnosis not present

## 2022-05-16 DIAGNOSIS — D509 Iron deficiency anemia, unspecified: Secondary | ICD-10-CM | POA: Diagnosis not present

## 2022-05-16 DIAGNOSIS — D631 Anemia in chronic kidney disease: Secondary | ICD-10-CM | POA: Diagnosis not present

## 2022-05-16 DIAGNOSIS — N186 End stage renal disease: Secondary | ICD-10-CM | POA: Diagnosis not present

## 2022-05-16 IMAGING — DX DG CHEST 1V PORT
1 series · 1 of 1 positions shown · non-contrast
Comparison: 11/21/2019 chest radiograph.

CLINICAL DATA: Dyspnea and chest pain

EXAM:
PORTABLE CHEST 1 VIEW

[chest]
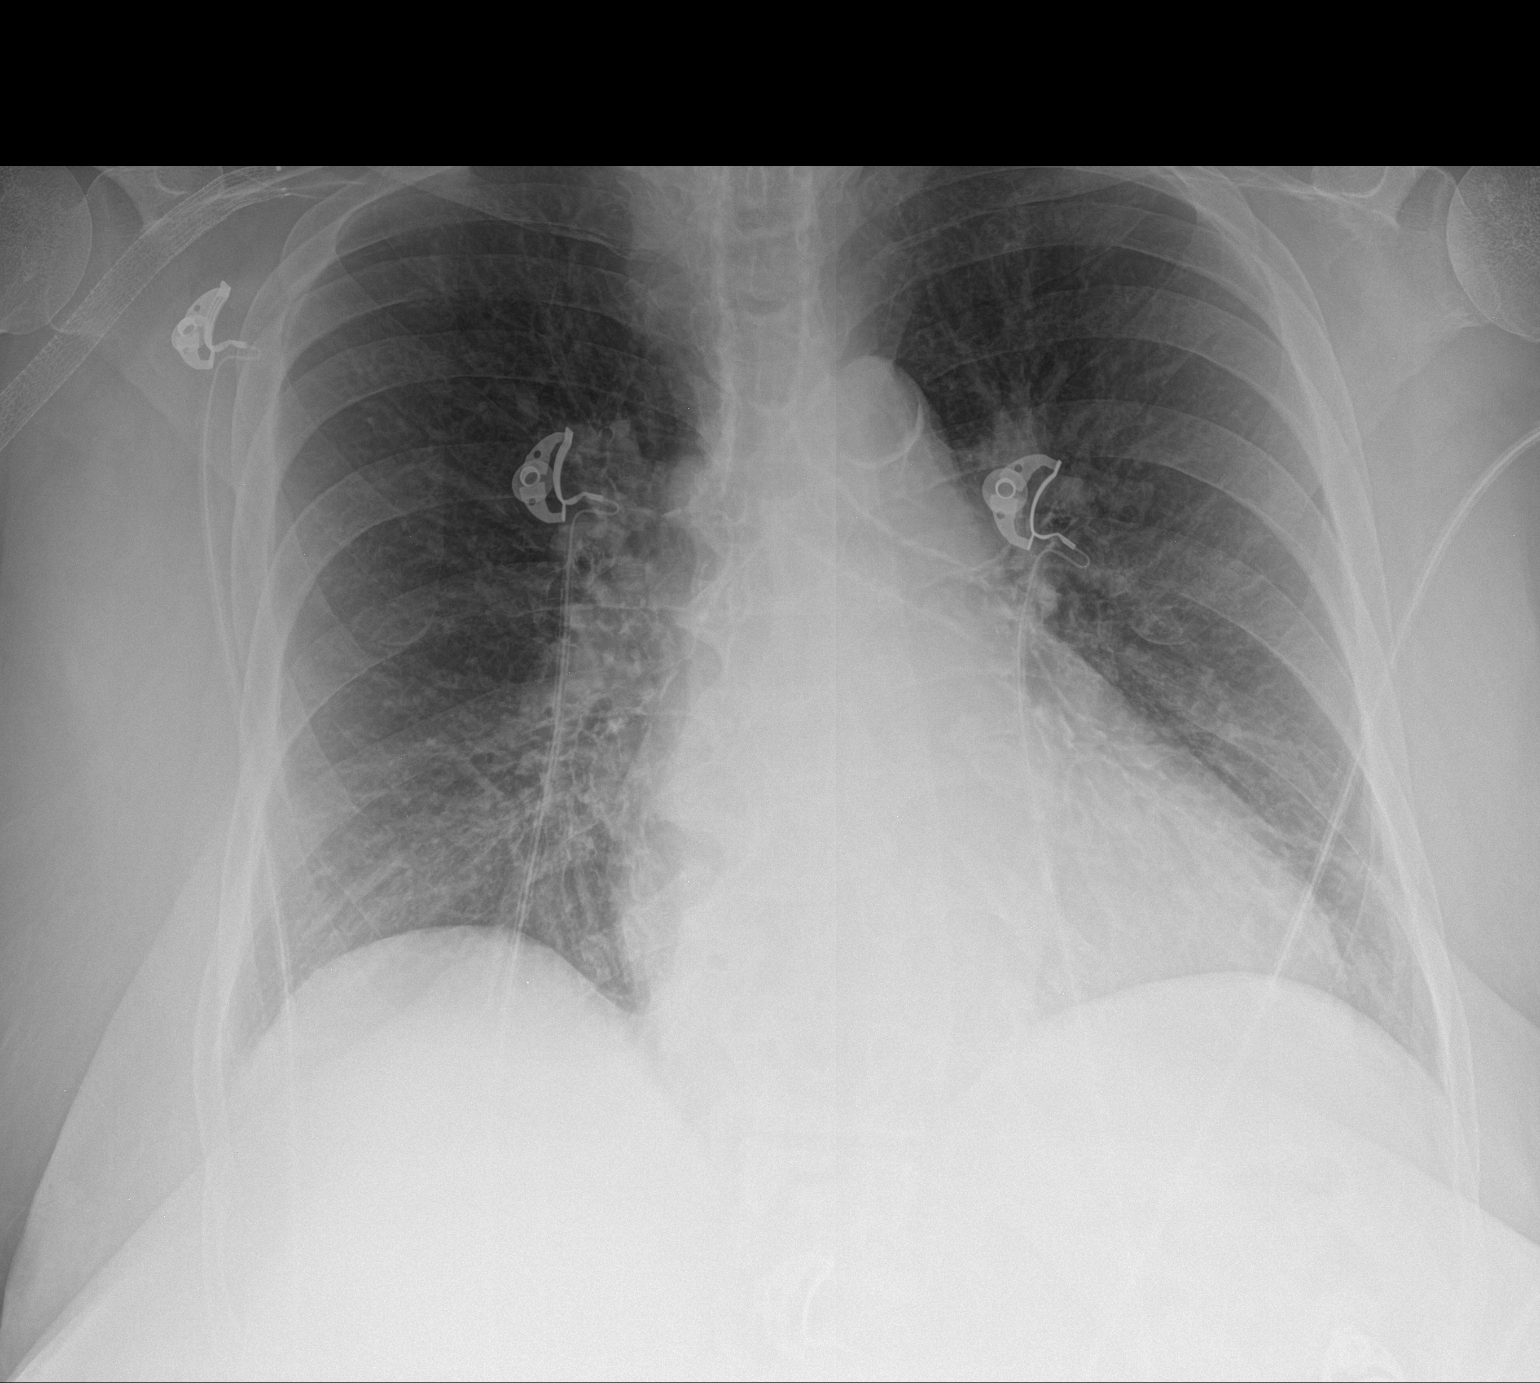

[1 of 1 positions shown; findings below may reference images not displayed]

FINDINGS: Stable cardiomediastinal silhouette with mild cardiomegaly. No
pneumothorax. No pleural effusion. No pulmonary edema. Minimal
curvilinear opacity at the peripheral left lung base. Vascular stent
overlies the right axilla.
IMPRESSION: 1. Stable mild cardiomegaly without pulmonary edema.
2. Minimal curvilinear opacity at the peripheral left lung base,
favor scarring or atelectasis.

## 2022-05-19 DIAGNOSIS — Z992 Dependence on renal dialysis: Secondary | ICD-10-CM | POA: Diagnosis not present

## 2022-05-19 DIAGNOSIS — D509 Iron deficiency anemia, unspecified: Secondary | ICD-10-CM | POA: Diagnosis not present

## 2022-05-19 DIAGNOSIS — D631 Anemia in chronic kidney disease: Secondary | ICD-10-CM | POA: Diagnosis not present

## 2022-05-19 DIAGNOSIS — N2581 Secondary hyperparathyroidism of renal origin: Secondary | ICD-10-CM | POA: Diagnosis not present

## 2022-05-19 DIAGNOSIS — N186 End stage renal disease: Secondary | ICD-10-CM | POA: Diagnosis not present

## 2022-05-21 DIAGNOSIS — N186 End stage renal disease: Secondary | ICD-10-CM | POA: Diagnosis not present

## 2022-05-21 DIAGNOSIS — I158 Other secondary hypertension: Secondary | ICD-10-CM | POA: Diagnosis not present

## 2022-05-21 DIAGNOSIS — D631 Anemia in chronic kidney disease: Secondary | ICD-10-CM | POA: Diagnosis not present

## 2022-05-21 DIAGNOSIS — D509 Iron deficiency anemia, unspecified: Secondary | ICD-10-CM | POA: Diagnosis not present

## 2022-05-21 DIAGNOSIS — K219 Gastro-esophageal reflux disease without esophagitis: Secondary | ICD-10-CM | POA: Diagnosis not present

## 2022-05-21 DIAGNOSIS — N2581 Secondary hyperparathyroidism of renal origin: Secondary | ICD-10-CM | POA: Diagnosis not present

## 2022-05-21 DIAGNOSIS — D649 Anemia, unspecified: Secondary | ICD-10-CM | POA: Diagnosis not present

## 2022-05-21 DIAGNOSIS — Z992 Dependence on renal dialysis: Secondary | ICD-10-CM | POA: Diagnosis not present

## 2022-05-23 ENCOUNTER — Ambulatory Visit: Payer: Medicare Other | Admitting: Nurse Practitioner

## 2022-05-23 ENCOUNTER — Encounter: Payer: Self-pay | Admitting: Nurse Practitioner

## 2022-05-23 ENCOUNTER — Other Ambulatory Visit (INDEPENDENT_AMBULATORY_CARE_PROVIDER_SITE_OTHER): Payer: Medicare Other

## 2022-05-23 ENCOUNTER — Ambulatory Visit (INDEPENDENT_AMBULATORY_CARE_PROVIDER_SITE_OTHER): Payer: Medicare Other | Admitting: Nurse Practitioner

## 2022-05-23 VITALS — BP 140/78 | HR 70 | Ht 65.0 in | Wt 197.8 lb

## 2022-05-23 DIAGNOSIS — K219 Gastro-esophageal reflux disease without esophagitis: Secondary | ICD-10-CM

## 2022-05-23 DIAGNOSIS — Z992 Dependence on renal dialysis: Secondary | ICD-10-CM | POA: Diagnosis not present

## 2022-05-23 DIAGNOSIS — N186 End stage renal disease: Secondary | ICD-10-CM

## 2022-05-23 DIAGNOSIS — D649 Anemia, unspecified: Secondary | ICD-10-CM

## 2022-05-23 DIAGNOSIS — D509 Iron deficiency anemia, unspecified: Secondary | ICD-10-CM | POA: Diagnosis not present

## 2022-05-23 DIAGNOSIS — D631 Anemia in chronic kidney disease: Secondary | ICD-10-CM | POA: Diagnosis not present

## 2022-05-23 DIAGNOSIS — N2581 Secondary hyperparathyroidism of renal origin: Secondary | ICD-10-CM | POA: Diagnosis not present

## 2022-05-23 LAB — CBC
HCT: 36.6 % (ref 36.0–46.0)
Hemoglobin: 11.8 g/dL — ABNORMAL LOW (ref 12.0–15.0)
MCHC: 32.3 g/dL (ref 30.0–36.0)
MCV: 88 fl (ref 78.0–100.0)
Platelets: 159 10*3/uL (ref 150.0–400.0)
RBC: 4.16 Mil/uL (ref 3.87–5.11)
RDW: 18.4 % — ABNORMAL HIGH (ref 11.5–15.5)
WBC: 3.9 10*3/uL — ABNORMAL LOW (ref 4.0–10.5)

## 2022-05-23 NOTE — Progress Notes (Unsigned)
05/23/2022 Jane [name] [id number] [birth year]   Chief Complaint: Acid reflux  History of Present Illness: Jane [name] is a 76 year old female with a past medical history of atrial fibrillation s/p left atrial appendage closure with implantation of a watchman device 02/06/2022 (Warfarin discontinued, started on Plavix and ASA x 6 months then ASA alone), carotid artery disease, ESRD on HD every M-W-F, chronic anemia, duodenal tubulovillous adenoma s/p EMR, GERD, H. pylori negative gastritis, subcentimeter hyperplastic gastric poyps, multiple adenomatous colon polyps and recurrent GI bleed secondary to colon AVMs. S/P partial colectomy secondary to diverticular disease 2019.    I last saw patient in office 01/15/2022 due to having nausea and rectal bleeding after taking Linzess 145mg QD. At that time, her rectal bleeding was thought to be hemorrhoidal and she was instructed to reduce Linzess to 728m QD and to use Desitin for hemorrhoidal irritation. She was instructed to continue Pantoprazole '40mg'$  bid and to add Famotidine at bed time to reduce nausea. Her symptoms improved on this regimen.  She presents today with complaints of having significant acid reflux for 3 weeks. Pantoprazole was discontinued 05/06/2022 and she was switched to Omeprazole '40mg'$  po bid and Famotidine was increased to '40mg'$  bid. Since then, her reflux symptoms have abated. She inadvertently continued taking Pantoprazole '40mg'$  bid for a few days after she was started on Omeprazole. She later realized she was instructed to stop it the Pantoprazole. She endorsed eating a lot of peppermint candy over the past month or two to settle her stomach. She bought 3 bags of peppermint which she consumed in 2 weeks. She realized that peppermint was triggering her reflux so she stopped eating any further peppermint candy. She also avoided tomato products and fried foods and her nausea also significantly improved. She is passing a normal formed  brown stool most days. No rectal bleeding or black stools.   She also questions if she is due for an EGD. As previously reviewed, she has a history of a tubulovillous adenoma of the duodenum s/p EMR 2021. Per Dr. PyHilarie Fredricksonrepeat endoscopy for surveillance was of questionable benefit.   She underwent dialysis earlier today without any difficulties. She reported her hemoglobin levels have been stable.   PAST GI PROCEDURES:  Colonoscopy 10/01/2021: - Three colonic angioectasias at the hepatic flexure and ascending colon. Treated with argon plasma coagulation (APC). - One 5 mm tubular adenomatous polyp in the transverse colon, removed with a cold snare. Resected and retrieved. - Patent end-to-side colo-colonic anastomosis, characterized by healthy appearing mucosa. - Non-bleeding external and internal hemorrhoids. - The examined portion of the ileum was normal. - The examination was otherwise normal   Colonoscopy 09/29/2021: - Patent end-to-side colo-colonic anastomosis, characterized by healthy appearing mucosa. - Two small polyps were identified but not removed, one in the sigmoid colon and one in the ascending colon. - Fresh blood and clots present throughout the entire examined colon and in the distal most 10 cm of the terminal ileum. - No mucosal abnormalities identified to explain the bleeding. - No specimens collected.   Small bowel capsule endoscopy 09/29/2021: 1.  Complete capsule endoscopy with adequate prep 2.  No blood in small bowel 3.  There is active bleeding in the colon starting about 1 hour beyond first cecal image, and blood throughout remainder of colon.  No discrete source identified.   Colonoscopy 09/26/2021: Blood throughout the colon. But, a source was not idenfied. - No blood present the terminal ileum. - Non-bleeding external  and internal hemorrhoids. - One small polyp in the sigmoid colon. Not resected during this procedure.   Small bowel enteroscopy  09/26/2021: - Normal esophagus. - Multiple gastric polyps. One polyp with evidence for recent bleeding was resected and retrieved. - Discolored mucosa in the antrum. Biopsied. - Nodular mucosa in the second portion of the duodenum. Likely to be related to prior adenoma resection. Biopsied to exclude recurrent adenoma. - No evidence for recent significant GI bleeding. - The examined portion of the jejunum was normal.     Latest Ref Rng & Units 02/06/2022    6:32 AM 02/03/2022   12:36 PM 10/02/2021   11:13 AM  CBC  WBC 3.4 - 10.8 x10E3/uL  5.0  6.2   Hemoglobin 12.0 - 15.0 g/dL 15.0  12.0  7.5   Hematocrit 36.0 - 46.0 % 44.0  37.0  23.4   Platelets 150 - 450 x10E3/uL  269  137      Echo 02/06/2022: Left Ventricle: Left ventricular ejection fraction, by estimation, is 55 to 60%. The left ventricle has normal function. The left ventricular internal cavity size was normal in size  PAST GI PROCEDURES:  Colonoscopy 10/01/2021: - Three colonic angioectasias at the hepatic flexure and ascending colon. Treated with argon plasma coagulation (APC). - One 5 mm tubular adenomatous polyp in the transverse colon, removed with a cold snare. Resected and retrieved. - Patent end-to-side colo-colonic anastomosis, characterized by healthy appearing mucosa. - Non-bleeding external and internal hemorrhoids. - The examined portion of the ileum was normal. - The examination was otherwise normal   Colonoscopy 09/29/2021: - Patent end-to-side colo-colonic anastomosis, characterized by healthy appearing mucosa. - Two small polyps were identified but not removed, one in the sigmoid colon and one in the ascending colon. - Fresh blood and clots present throughout the entire examined colon and in the distal most 10 cm of the terminal ileum. - No mucosal abnormalities identified to explain the bleeding. - No specimens collected.   Small bowel capsule endoscopy 09/29/2021: 1.  Complete capsule endoscopy with  adequate prep 2.  No blood in small bowel 3.  There is active bleeding in the colon starting about 1 hour beyond first cecal image, and blood throughout remainder of colon.  No discrete source identified.   Colonoscopy 09/26/2021: Blood throughout the colon. But, a source was not idenfied. - No blood present the terminal ileum. - Non-bleeding external and internal hemorrhoids. - One small polyp in the sigmoid colon. Not resected during this procedure.   Small bowel enteroscopy 09/26/2021: - Normal esophagus. - Multiple gastric polyps. One polyp with evidence for recent bleeding was resected and retrieved. - Discolored mucosa in the antrum. Biopsied. - Nodular mucosa in the second portion of the duodenum. Likely to be related to prior adenoma resection. Biopsied to exclude recurrent adenoma. - No evidence for recent significant GI bleeding. - The examined portion of the jejunum was normal.   Current Outpatient Medications on File Prior to Visit  Medication Sig Dispense Refill   aspirin EC 81 MG tablet Take 1 tablet (81 mg total) by mouth daily. Swallow whole. 30 tablet 12   atorvastatin (LIPITOR) 10 MG tablet TAKE ONE TABLET BY MOUTH ONCE DAILY 30 tablet 3   B Complex-C-Zn-Folic Acid (DIALYVITE 338 WITH ZINC) 0.8 MG TABS Take 1 tablet by mouth at bedtime.     carvedilol (COREG) 6.25 MG tablet Take 12.5 mg by mouth 2 (two) times daily. 180 tablet 3   cinacalcet (SENSIPAR) 30 MG tablet  Take 30 mg by mouth every Monday, Wednesday, and Friday with hemodialysis. Post dialysis     clopidogrel (PLAVIX) 75 MG tablet Take 1 tablet (75 mg total) by mouth daily. 90 tablet 1   diltiazem (CARDIZEM) 60 MG tablet Take 1 tablet (60 mg total) by mouth 3 (three) times daily as needed (Atrial fibrillation). 30 tablet 1   ethyl chloride spray Apply 1 application topically every Monday, Wednesday, and Friday with hemodialysis.  12   famotidine (PEPCID) 40 MG tablet Take 1 tablet (40 mg total) by mouth 2 (two)  times daily. 60 tablet 1   isosorbide mononitrate (IMDUR) 30 MG 24 hr tablet Take 1 tablet (30 mg total) by mouth daily. 30 tablet 1   linaclotide (LINZESS) 72 MCG capsule Take 1 capsule (72 mcg total) by mouth daily before breakfast. 30 capsule 2   Methoxy PEG-Epoetin Beta (MIRCERA IJ) Inject 1 Dose into the vein every Monday, Wednesday, and Friday with hemodialysis.     omeprazole (PRILOSEC) 40 MG capsule Take 1 capsule (40 mg total) by mouth 2 (two) times daily. 60 capsule 1   sevelamer carbonate (RENVELA) 800 MG tablet Take 1,600 mg by mouth in the morning and at bedtime. With meals take 800 mg with snack     Current Facility-Administered Medications on File Prior to Visit  Medication Dose Route Frequency Provider Last Rate Last Admin   betamethasone acetate-betamethasone sodium phosphate (CELESTONE) injection 12 mg  12 mg Intramuscular Once Kirsteins, Luanna Salk, MD       lidocaine (XYLOCAINE) 1 % (with pres) injection 4 mL  4 mL Other Once Kirsteins, Luanna Salk, MD       Allergies  Allergen Reactions   Latex Rash   Penicillins Other (See Comments)    Yeast infection / Childhood   Sulfa Antibiotics Rash   Tape Other (See Comments)    Plastic, silicone, and paper tape causes bruising and pulls off skin. Cloth tape works fine   Reglan [Metoclopramide] Itching    Itchiness, bug crawling sensation     Current Medications, Allergies, Past Medical History, Past Surgical History, Family History and Social History were reviewed in [employer] record.  Review of Systems:   Constitutional: Negative for fever, sweats, chills or weight loss.  Respiratory: Negative for shortness of breath.   Cardiovascular: Negative for chest pain, palpitations and leg swelling.  Gastrointestinal: See HPI.  Musculoskeletal: Negative for back pain or muscle aches.  Neurological: Negative for dizziness, headaches or paresthesias.   Physical Exam: BP (!) 140/78   Pulse 70   Ht '5\' 5"'$   (1.651 m)   Wt 197 lb 12.8 oz (89.7 kg)   SpO2 95%   BMI 32.92 kg/m   General: 76 year old female in NAD. Head: Normocephalic and atraumatic. Eyes: No scleral icterus. Conjunctiva pink . Ears: Normal auditory acuity. Mouth: Dentition intact. No ulcers or lesions.  Lungs: Clear throughout to auscultation. Heart: Regular rate and rhythm. Systolic murmur.  Abdomen: Soft, nontender and nondistended. No masses or hepatomegaly. Normal bowel sounds x 4 quadrants.  Rectal: Deferred. Musculoskeletal: Symmetrical with no gross deformities. Extremities: No edema. RUE AV fistula with + bruit and thrill.  Neurological: Alert oriented x 4. No focal deficits.  Psychological: Alert and cooperative. Normal mood and affect  Assessment and Recommendations:  36) 76 year old with a history of GERD who experienced worsening acid reflux symptoms after she consumed a moderate amount of peppermint candy daily. Pantoprazole switched to Omeprazole '40mg'$  bid and  Famotidine was increased to '40mg'$  bid and her acid reflux symptoms abated.  -No peppermint.  -GERD diet, avoid spicy and fatty foods -Continue Omeprazole '40mg'$  po bid and Famotidine '40mg'$  po bid for now  2) Nausea, nearly abated after avoiding acidic and fried foods. Intolerant to Reglan (bug crawling sensation) -Ginger tea as tolerated  -FDgard 2 capsule po bid PRN OTC  3) Chronic anemia, secondary to ESRD and GI blood loss (colonic AMVs +/- hemorrhoids). No recent overt GI bleeding. Patient at risk for recurrent GI bleed on ASA and Plavix.  -CBC -Patient to contact our office if she develops any black stools or blood per the rectum   4) ESRD on HD  5) Atrial fibrillation s/p left atrial appendage closure with implantation of a watchman device 02/06/2022 (Warfarin discontinued, started on Plavix and ASA x 6 months then ASA alone per cardiology)  6) Chronic constipation  -Continue Linzess 41mg once daily to be taken 30 minutes before breakfast    7)  History of a tubulovillous adenoma of the duodenum s/p EMR 2021. Per Dr. PHilarie Fredrickson We can discuss the value of surveillance upper endoscopy though biopsies from the duodenal EMR site showed no recurrent adenomatous change.  Repeat upper endoscopy for surveillance of questionable benefit.   8) History of multiple tubular adenomatous colon polyps.  Colonoscopy 10/01/2021 identified one 5 mm tubular adenomatous polyp which was removed from the transverse colon. -No further colon polyp surveillance colonoscopies due to age

## 2022-05-23 NOTE — Patient Instructions (Addendum)
Stop Pantoprazole (Protonix) Continue Omeprazole (Prilosec) '40mg'$  twice daily to be be taken 30 minutes before breakfast and dinner  Continue Famotidine (Pepcic) '40mg'$  mg one tab twice daily  No peppermint candy Try Fdgard two capsule twice daily or ginger tea for nausea   Your provider has requested that you go to the basement level for lab work before leaving today. Press "B" on the elevator. The lab is located at the first door on the left as you exit the elevator.   Due to recent changes in healthcare laws, you may see the results of your imaging and laboratory studies on MyChart before your provider has had a chance to review them.  We understand that in some cases there may be results that are confusing or concerning to you. Not all laboratory results come back in the same time frame and the provider may be waiting for multiple results in order to interpret others.  Please give [place] 48 hours in order for your provider to thoroughly review all the results before contacting the office for clarification of your results.    It was a pleasure to see you today!  Thank you for trusting me with your gastrointestinal care!

## 2022-05-24 ENCOUNTER — Encounter: Payer: Self-pay | Admitting: Nurse Practitioner

## 2022-05-26 DIAGNOSIS — N186 End stage renal disease: Secondary | ICD-10-CM | POA: Diagnosis not present

## 2022-05-26 DIAGNOSIS — Z992 Dependence on renal dialysis: Secondary | ICD-10-CM | POA: Diagnosis not present

## 2022-05-26 DIAGNOSIS — D509 Iron deficiency anemia, unspecified: Secondary | ICD-10-CM | POA: Diagnosis not present

## 2022-05-26 DIAGNOSIS — N2581 Secondary hyperparathyroidism of renal origin: Secondary | ICD-10-CM | POA: Diagnosis not present

## 2022-05-26 DIAGNOSIS — D631 Anemia in chronic kidney disease: Secondary | ICD-10-CM | POA: Diagnosis not present

## 2022-05-28 DIAGNOSIS — D631 Anemia in chronic kidney disease: Secondary | ICD-10-CM | POA: Diagnosis not present

## 2022-05-28 DIAGNOSIS — I4891 Unspecified atrial fibrillation: Secondary | ICD-10-CM | POA: Diagnosis not present

## 2022-05-28 DIAGNOSIS — N2581 Secondary hyperparathyroidism of renal origin: Secondary | ICD-10-CM | POA: Diagnosis not present

## 2022-05-28 DIAGNOSIS — N186 End stage renal disease: Secondary | ICD-10-CM | POA: Diagnosis not present

## 2022-05-28 DIAGNOSIS — D509 Iron deficiency anemia, unspecified: Secondary | ICD-10-CM | POA: Diagnosis not present

## 2022-05-28 DIAGNOSIS — Z992 Dependence on renal dialysis: Secondary | ICD-10-CM | POA: Diagnosis not present

## 2022-05-29 ENCOUNTER — Telehealth: Payer: Self-pay | Admitting: Nurse Practitioner

## 2022-05-29 ENCOUNTER — Other Ambulatory Visit: Payer: Self-pay | Admitting: Cardiology

## 2022-05-29 ENCOUNTER — Other Ambulatory Visit: Payer: Self-pay | Admitting: Nurse Practitioner

## 2022-05-29 DIAGNOSIS — I6523 Occlusion and stenosis of bilateral carotid arteries: Secondary | ICD-10-CM

## 2022-05-29 DIAGNOSIS — K219 Gastro-esophageal reflux disease without esophagitis: Secondary | ICD-10-CM

## 2022-05-29 DIAGNOSIS — Z5181 Encounter for therapeutic drug level monitoring: Secondary | ICD-10-CM

## 2022-05-29 MED ORDER — OMEPRAZOLE 40 MG PO CPDR: 40.0000 mg | DELAYED_RELEASE_CAPSULE | Freq: Two times a day (BID) | ORAL | 1 refills | Status: DC

## 2022-05-29 MED ORDER — FAMOTIDINE 40 MG PO TABS: 40.0000 mg | ORAL_TABLET | Freq: Two times a day (BID) | ORAL | 1 refills | Status: DC

## 2022-05-29 NOTE — Telephone Encounter (Signed)
Inbound call from patient pharmacy requesting refill on famotidine and omeprazole, states patient is going out of town and needs extra fills. Please advise.

## 2022-05-30 ENCOUNTER — Telehealth: Payer: Self-pay | Admitting: Pharmacist

## 2022-05-30 ENCOUNTER — Other Ambulatory Visit: Payer: Self-pay | Admitting: Nurse Practitioner

## 2022-05-30 ENCOUNTER — Other Ambulatory Visit: Payer: Self-pay | Admitting: Cardiology

## 2022-05-30 ENCOUNTER — Other Ambulatory Visit: Payer: Self-pay | Admitting: Internal Medicine

## 2022-05-30 ENCOUNTER — Other Ambulatory Visit: Payer: Self-pay

## 2022-05-30 ENCOUNTER — Other Ambulatory Visit: Payer: Self-pay | Admitting: Family Medicine

## 2022-05-30 DIAGNOSIS — R0789 Other chest pain: Secondary | ICD-10-CM

## 2022-05-30 DIAGNOSIS — K219 Gastro-esophageal reflux disease without esophagitis: Secondary | ICD-10-CM

## 2022-05-30 DIAGNOSIS — D631 Anemia in chronic kidney disease: Secondary | ICD-10-CM | POA: Diagnosis not present

## 2022-05-30 DIAGNOSIS — N186 End stage renal disease: Secondary | ICD-10-CM | POA: Diagnosis not present

## 2022-05-30 DIAGNOSIS — Z5181 Encounter for therapeutic drug level monitoring: Secondary | ICD-10-CM

## 2022-05-30 DIAGNOSIS — I6523 Occlusion and stenosis of bilateral carotid arteries: Secondary | ICD-10-CM

## 2022-05-30 DIAGNOSIS — Z992 Dependence on renal dialysis: Secondary | ICD-10-CM | POA: Diagnosis not present

## 2022-05-30 DIAGNOSIS — N2581 Secondary hyperparathyroidism of renal origin: Secondary | ICD-10-CM | POA: Diagnosis not present

## 2022-05-30 DIAGNOSIS — D509 Iron deficiency anemia, unspecified: Secondary | ICD-10-CM | POA: Diagnosis not present

## 2022-05-30 DIAGNOSIS — I129 Hypertensive chronic kidney disease with stage 1 through stage 4 chronic kidney disease, or unspecified chronic kidney disease: Secondary | ICD-10-CM

## 2022-05-30 DIAGNOSIS — K5900 Constipation, unspecified: Secondary | ICD-10-CM

## 2022-05-30 MED ORDER — OMEPRAZOLE 40 MG PO CPDR: 40.0000 mg | DELAYED_RELEASE_CAPSULE | Freq: Two times a day (BID) | ORAL | 1 refills | Status: DC

## 2022-05-30 MED ORDER — ATORVASTATIN CALCIUM 10 MG PO TABS: 10.0000 mg | ORAL_TABLET | Freq: Every day | ORAL | 1 refills | Status: DC

## 2022-05-30 MED ORDER — FAMOTIDINE 40 MG PO TABS: 40.0000 mg | ORAL_TABLET | Freq: Two times a day (BID) | ORAL | 1 refills | Status: DC

## 2022-05-30 NOTE — Chronic Care Management (AMB) (Addendum)
Chronic Care Management Pharmacy Assistant   Name: Jane [name]  MRN: [id number] DOB: 1946-06-21  Reason for Encounter: Medication Review / Medication Coordination Call   Recent office visits:  04/15/2022 [name] MD - Patient was seen for Injury of left knee, subsequent encounter and additional concerns. No medication changes.   04/04/2022 [name] MD - Patient was seen for Hypertension with renal disease and an additional concern. No medication changes.   04/01/2022 [name] MD - Patient was seen for Hypertension with renal disease and an additional concern. Started Isosorbide 30 mg daily. Discontinued Midodrine.   03/13/2022 [name] MD - Patient was seen for Hypertension with renal disease and an additional concern.   Recent consult visits:  05/23/2022 Carl Best NP (GI) - Patient was seen for ESRD on dialysis and an additional issue. No medication changes.   04/29/2022 Frankey Shown MD (ortho) - Patient was seen for  Right knee pain, unspecified chronicity. No medication changes.  04/22/2022 [name] MD (PT rehab) - Patient was seen for Chronic pain of right knee and additional concern. No medication changes.   03/26/2022 Adrian Prows MD (card) - Patient was seen for Longstanding persistent atrial fibrillation and additional concerns. Changed Carvedilol to 6.25 mg 2 times daily, Take two tablets if SBP >150 mm.  03/19/2022 Angelena Form PA-C (card) - Patient was seen for Presence of Watchman left atrial appendage closure device and additional concerns. Started Clopidogrel 75 mg daily. Discontinued Diazepam and Warfarin.   03/06/2022 Adrian Prows MD (card) - Patient was seen for Paroxysmal atrial fibrillation. No medication changes.   02/28/2022 [name] MD (PT rehab) - Patient was seen for Spondylosis without myelopathy or radiculopathy, lumbar region. Changed Carvedilol to 6.25 mg twice daily.   02/18/2022 Adrian Prows MD (card) - Patient was seen for  Hypertension with renal disease. No medication changes.   Hospital visits:  None  Medications: Outpatient Encounter Medications as of 05/30/2022  Medication Sig   aspirin EC 81 MG tablet Take 1 tablet (81 mg total) by mouth daily. Swallow whole.   atorvastatin (LIPITOR) 10 MG tablet Take 1 tablet (10 mg total) by mouth daily.   B Complex-C-Zn-Folic Acid (DIALYVITE 563 WITH ZINC) 0.8 MG TABS Take 1 tablet by mouth at bedtime.   carvedilol (COREG) 6.25 MG tablet Take 12.5 mg by mouth 2 (two) times daily.   cinacalcet (SENSIPAR) 30 MG tablet Take 30 mg by mouth every Monday, Wednesday, and Friday with hemodialysis. Post dialysis   clopidogrel (PLAVIX) 75 MG tablet Take 1 tablet (75 mg total) by mouth daily.   diltiazem (CARDIZEM) 60 MG tablet Take 1 tablet (60 mg total) by mouth 3 (three) times daily as needed (Atrial fibrillation).   ethyl chloride spray Apply 1 application topically every Monday, Wednesday, and Friday with hemodialysis.   famotidine (PEPCID) 40 MG tablet Take 1 tablet (40 mg total) by mouth 2 (two) times daily.   isosorbide mononitrate (IMDUR) 30 MG 24 hr tablet Take 1 tablet (30 mg total) by mouth daily.   linaclotide (LINZESS) 72 MCG capsule Take 1 capsule (72 mcg total) by mouth daily before breakfast.   Methoxy PEG-Epoetin Beta (MIRCERA IJ) Inject 1 Dose into the vein every Monday, Wednesday, and Friday with hemodialysis.   omeprazole (PRILOSEC) 40 MG capsule Take 1 capsule (40 mg total) by mouth 2 (two) times daily.   sevelamer carbonate (RENVELA) 800 MG tablet Take 1,600 mg by mouth in the morning and at bedtime. With  meals take 800 mg with snack   Facility-Administered Encounter Medications as of 05/30/2022  Medication   betamethasone acetate-betamethasone sodium phosphate (CELESTONE) injection 12 mg   lidocaine (XYLOCAINE) 1 % (with pres) injection 4 mL   Reviewed chart for medication changes ahead of medication coordination call.  BP Readings from Last 3  Encounters:  05/23/22 (!) 140/78  04/22/22 (!) 170/76  04/15/22 110/60    No results found for: "HGBA1C"   Patient obtains medications through [facility]  90 Days    Last adherence delivery included: Atorvastatin 10 mg - 1 tablet once daily Linzess 72 mcg - 1 capsule before breakfast (verified with patient, this is what she is taking) Pantoprazole 40 mg - 1 tablet twice daily Famotidine 20 mg - 1 tablet at bedtime  Dialyvite 800 with zinc - take 1 tablet every morning Warfarin 5 mg - 1 tablet once daily as directed.    Patient declined (meds) last month:   No medications declined     Patient is due for next adherence delivery on: 06/06/2022   Called patient and reviewed medications and coordinated delivery.   This delivery to include: Atorvastatin 10 mg - 1 tablet once daily Linzess 72 mcg - 1 capsule before breakfast  Omeprazole 40 mg - 1 tablet twice daily Famotidine 20 mg - 1 tablet at bedtime Dialyvite 800 with zinc - take 1 tablet every morning Clopidogrel 75 mg - 1 tablet daily Ethyl Chloride Topical - use as directed Carvedilol 6.25 mg mg - 2 tablet twice daily   Patient will need a short fill: Patient denies needing a short fill   Coordinated acute fill: Patient denies any acute fills.    Patient declined the following medications:  Diltiazem 60 mg  - 1 tablet 3 times daily as needed for Afib   Renvela is supplied at the dialysis center   Confirmed delivery date change to 06/06/2022, advised patient that pharmacy will contact them the morning of delivery.   Care Gaps: AWV - scheduled for 08/12/2022 Last BP - 140/78 on 05/23/2022  Covid - overdue Shingrix - postponed  Star Rating Drugs: Atorvastatin 10 mg - last filled 03/20/2022 90 DS at [facility] [phone]  Called patient to ask about the isosorbide to see if she is no longer taking it. She had stopped it due to side effects and is no longer having chest  pain. Will remove from medication list.  Jane [name], PharmD, [place] Pharmacist [facility]

## 2022-05-30 NOTE — Chronic Care Management (AMB) (Unsigned)
  Done in Error

## 2022-05-30 NOTE — Addendum Note (Signed)
Addended by: Viona Gilmore on: 05/30/2022 04:41 PM   Modules accepted: Orders

## 2022-06-02 ENCOUNTER — Telehealth: Payer: Self-pay

## 2022-06-02 ENCOUNTER — Other Ambulatory Visit: Payer: Self-pay

## 2022-06-02 DIAGNOSIS — K219 Gastro-esophageal reflux disease without esophagitis: Secondary | ICD-10-CM

## 2022-06-02 DIAGNOSIS — Z992 Dependence on renal dialysis: Secondary | ICD-10-CM | POA: Diagnosis not present

## 2022-06-02 DIAGNOSIS — N2581 Secondary hyperparathyroidism of renal origin: Secondary | ICD-10-CM | POA: Diagnosis not present

## 2022-06-02 DIAGNOSIS — D509 Iron deficiency anemia, unspecified: Secondary | ICD-10-CM | POA: Diagnosis not present

## 2022-06-02 DIAGNOSIS — D631 Anemia in chronic kidney disease: Secondary | ICD-10-CM | POA: Diagnosis not present

## 2022-06-02 DIAGNOSIS — N186 End stage renal disease: Secondary | ICD-10-CM | POA: Diagnosis not present

## 2022-06-02 MED ORDER — LANSOPRAZOLE 30 MG PO CPDR: 30.0000 mg | DELAYED_RELEASE_CAPSULE | Freq: Two times a day (BID) | ORAL | 2 refills | Status: DC

## 2022-06-02 NOTE — Telephone Encounter (Signed)
In regard to staff message copied and pasted below: Prescription was sent for  Lansoprazole '30mg'$  one po bid to be taken 30 minutes before breakfast and dinner, Pt made aware:  Pt notified to stop taking the Omeprazole:  Pt verbalized understanding with all questions answered.      FW: Plavix and omeprazole drug interaction Received: Today Noralyn Pick, NP  Gillermina Hu, RN Remo Lipps, pls send in RX for Lansoprazole '30mg'$  one po bid # 60, 2 refills thx. Remo Lipps, please convert this message into a phone note. THX.       Previous Messages    ----- Message ----- From: Viona Gilmore, [facility] Sent: 06/02/2022   8:20 AM EST To: Noralyn Pick, NP Subject: RE: Plavix and omeprazole drug interaction    I will definitely communicate the change with her! Can you please send in a new prescription to [facility]? I don't have the ability to send in prescriptions.  Thanks! Maddie ----- Message ----- From: Noralyn Pick, NP Sent: 06/02/2022   7:58 AM EST To: Viona Gilmore, RPH Subject: RE: Plavix and omeprazole drug interaction    Hi Maddie, I appreciate your input. I know in the past there were issues with PPI and Plavix but I thought Omeprazole was ok to use. Ok to switch Ms. Baria to Lansoprazole '30mg'$  one po bid to be taken 30 minutes before breakfast and dinner. Definitely need to make sure she is not confused about this switch as for a brief period she inadvertently took both Omeprazole and Pantoprazole when I switched her PPI. Thank you, Colleen ----- Message ----- From: Viona Gilmore, [facility] Sent: 05/30/2022   4:49 PM EST To: Noralyn Pick, NP Subject: Plavix and omeprazole drug interaction        Hi,  I just spoke with Ms. Manninen about her medications and realized she was recently switched from pantoprazole to omeprazole due to recurrent GERD symptoms but omeprazole isn't really recommended to be on with Plavix due to reduced efficacy of  Plavix. Is there another PPI we could switch her to instead? Lansoprazole or Dexilant do not carry that drug interaction.  Please let me know your thoughts and I'll be happy to call the patient back about the change!  Thanks, Maddie

## 2022-06-02 NOTE — Progress Notes (Signed)
Addendum: Reviewed and agree with assessment and management plan. Given small bowel enteroscopy in March 2023 with duodenal biopsies negative for persistent adenoma I do not recommend surveillance upper endoscopy at this time.  Given that this has been previously resected without recurrence as of March 2023 she will likely not benefit from repeat surveillance upper endoscopy. Catrena Vari, Lajuan Lines, MD

## 2022-06-02 NOTE — Progress Notes (Signed)
Remo Lipps, please let patient know Dr. Hilarie Fredrickson did not assess she needed a repeat upper endoscopy at this time.  Thank you.

## 2022-06-02 NOTE — Progress Notes (Signed)
I commented on your note, but forgot to forward. Thanks [employer]

## 2022-06-03 ENCOUNTER — Telehealth: Payer: Self-pay

## 2022-06-03 NOTE — Telephone Encounter (Signed)
-----   Message from Noralyn Pick, NP sent at 06/02/2022  4:51 PM EST -----    ----- Message ----- From: Jerene Bears, MD Sent: 06/02/2022   4:29 PM EST To: Noralyn Pick, NP     ----- Message ----- From: Noralyn Pick, NP Sent: 05/24/2022   3:06 PM EST To: Jerene Bears, MD  Dr. Hilarie Fredrickson, let me know if you think she needs an EGD at some point d/t recent acid reflux sx with hx of tubulovillous duodenal adenoma. EGD recall was due 05/2022. Your prior notes indicated you would discuss the utility of a surveillance EGD with the patient. She is off Warfarin, but on Plavix and ASA. Let me know your recommendations and I will contact patient. Her Hg level came back at 11.8.

## 2022-06-03 NOTE — Telephone Encounter (Signed)
Noralyn Pick, NP  Gillermina Hu, RN      Previous Messages  Routed Note  Author: Noralyn Pick, NP Service: Gastroenterology Author Type: Nurse Practitioner  Filed: 06/02/2022  4:51 PM Encounter Date: 05/23/2022 Status: Signed  Editor: Noralyn Pick, NP (Nurse Practitioner)   Remo Lipps, please let patient know Dr. Hilarie Fredrickson did not assess she needed a repeat upper endoscopy at this time.  Thank you.      Pt was notified of Dr. Hilarie Fredrickson recommendations:  Pt verbalized understanding with all questions answered.

## 2022-06-04 ENCOUNTER — Other Ambulatory Visit: Payer: Self-pay

## 2022-06-04 DIAGNOSIS — N2581 Secondary hyperparathyroidism of renal origin: Secondary | ICD-10-CM | POA: Diagnosis not present

## 2022-06-04 DIAGNOSIS — Z992 Dependence on renal dialysis: Secondary | ICD-10-CM | POA: Diagnosis not present

## 2022-06-04 DIAGNOSIS — D509 Iron deficiency anemia, unspecified: Secondary | ICD-10-CM | POA: Diagnosis not present

## 2022-06-04 DIAGNOSIS — N186 End stage renal disease: Secondary | ICD-10-CM | POA: Diagnosis not present

## 2022-06-04 DIAGNOSIS — D631 Anemia in chronic kidney disease: Secondary | ICD-10-CM | POA: Diagnosis not present

## 2022-06-06 DIAGNOSIS — N2581 Secondary hyperparathyroidism of renal origin: Secondary | ICD-10-CM | POA: Diagnosis not present

## 2022-06-06 DIAGNOSIS — D509 Iron deficiency anemia, unspecified: Secondary | ICD-10-CM | POA: Diagnosis not present

## 2022-06-06 DIAGNOSIS — Z992 Dependence on renal dialysis: Secondary | ICD-10-CM | POA: Diagnosis not present

## 2022-06-06 DIAGNOSIS — D631 Anemia in chronic kidney disease: Secondary | ICD-10-CM | POA: Diagnosis not present

## 2022-06-06 DIAGNOSIS — N186 End stage renal disease: Secondary | ICD-10-CM | POA: Diagnosis not present

## 2022-06-09 ENCOUNTER — Other Ambulatory Visit: Payer: Self-pay

## 2022-06-09 DIAGNOSIS — N186 End stage renal disease: Secondary | ICD-10-CM | POA: Diagnosis not present

## 2022-06-09 DIAGNOSIS — Z992 Dependence on renal dialysis: Secondary | ICD-10-CM | POA: Diagnosis not present

## 2022-06-09 DIAGNOSIS — K219 Gastro-esophageal reflux disease without esophagitis: Secondary | ICD-10-CM

## 2022-06-09 DIAGNOSIS — N2581 Secondary hyperparathyroidism of renal origin: Secondary | ICD-10-CM | POA: Diagnosis not present

## 2022-06-09 MED ORDER — FAMOTIDINE 40 MG PO TABS: 40.0000 mg | ORAL_TABLET | Freq: Two times a day (BID) | ORAL | 1 refills | Status: DC

## 2022-06-11 DIAGNOSIS — N2581 Secondary hyperparathyroidism of renal origin: Secondary | ICD-10-CM | POA: Diagnosis not present

## 2022-06-11 DIAGNOSIS — Z992 Dependence on renal dialysis: Secondary | ICD-10-CM | POA: Diagnosis not present

## 2022-06-11 DIAGNOSIS — N186 End stage renal disease: Secondary | ICD-10-CM | POA: Diagnosis not present

## 2022-06-13 DIAGNOSIS — N186 End stage renal disease: Secondary | ICD-10-CM | POA: Diagnosis not present

## 2022-06-13 DIAGNOSIS — N2581 Secondary hyperparathyroidism of renal origin: Secondary | ICD-10-CM | POA: Diagnosis not present

## 2022-06-13 DIAGNOSIS — Z992 Dependence on renal dialysis: Secondary | ICD-10-CM | POA: Diagnosis not present

## 2022-06-16 DIAGNOSIS — N186 End stage renal disease: Secondary | ICD-10-CM | POA: Diagnosis not present

## 2022-06-16 DIAGNOSIS — Z992 Dependence on renal dialysis: Secondary | ICD-10-CM | POA: Diagnosis not present

## 2022-06-16 DIAGNOSIS — N2581 Secondary hyperparathyroidism of renal origin: Secondary | ICD-10-CM | POA: Diagnosis not present

## 2022-06-18 DIAGNOSIS — N2581 Secondary hyperparathyroidism of renal origin: Secondary | ICD-10-CM | POA: Diagnosis not present

## 2022-06-18 DIAGNOSIS — Z992 Dependence on renal dialysis: Secondary | ICD-10-CM | POA: Diagnosis not present

## 2022-06-18 DIAGNOSIS — N186 End stage renal disease: Secondary | ICD-10-CM | POA: Diagnosis not present

## 2022-06-19 DIAGNOSIS — N186 End stage renal disease: Secondary | ICD-10-CM | POA: Diagnosis not present

## 2022-06-19 DIAGNOSIS — Z992 Dependence on renal dialysis: Secondary | ICD-10-CM | POA: Diagnosis not present

## 2022-06-20 DIAGNOSIS — I158 Other secondary hypertension: Secondary | ICD-10-CM | POA: Diagnosis not present

## 2022-06-20 DIAGNOSIS — N2581 Secondary hyperparathyroidism of renal origin: Secondary | ICD-10-CM | POA: Diagnosis not present

## 2022-06-20 DIAGNOSIS — Z992 Dependence on renal dialysis: Secondary | ICD-10-CM | POA: Diagnosis not present

## 2022-06-20 DIAGNOSIS — N186 End stage renal disease: Secondary | ICD-10-CM | POA: Diagnosis not present

## 2022-06-23 DIAGNOSIS — Z992 Dependence on renal dialysis: Secondary | ICD-10-CM | POA: Diagnosis not present

## 2022-06-23 DIAGNOSIS — D631 Anemia in chronic kidney disease: Secondary | ICD-10-CM | POA: Diagnosis not present

## 2022-06-23 DIAGNOSIS — N2581 Secondary hyperparathyroidism of renal origin: Secondary | ICD-10-CM | POA: Diagnosis not present

## 2022-06-23 DIAGNOSIS — N186 End stage renal disease: Secondary | ICD-10-CM | POA: Diagnosis not present

## 2022-06-25 DIAGNOSIS — N186 End stage renal disease: Secondary | ICD-10-CM | POA: Diagnosis not present

## 2022-06-25 DIAGNOSIS — Z992 Dependence on renal dialysis: Secondary | ICD-10-CM | POA: Diagnosis not present

## 2022-06-25 DIAGNOSIS — D631 Anemia in chronic kidney disease: Secondary | ICD-10-CM | POA: Diagnosis not present

## 2022-06-25 DIAGNOSIS — N2581 Secondary hyperparathyroidism of renal origin: Secondary | ICD-10-CM | POA: Diagnosis not present

## 2022-06-25 DIAGNOSIS — I10 Essential (primary) hypertension: Secondary | ICD-10-CM | POA: Diagnosis not present

## 2022-06-26 ENCOUNTER — Telehealth: Payer: Self-pay

## 2022-06-26 ENCOUNTER — Encounter: Payer: Self-pay | Admitting: Orthopaedic Surgery

## 2022-06-26 ENCOUNTER — Ambulatory Visit (INDEPENDENT_AMBULATORY_CARE_PROVIDER_SITE_OTHER): Payer: Medicare Other

## 2022-06-26 ENCOUNTER — Ambulatory Visit (INDEPENDENT_AMBULATORY_CARE_PROVIDER_SITE_OTHER): Payer: Medicare Other | Admitting: Orthopaedic Surgery

## 2022-06-26 DIAGNOSIS — G8929 Other chronic pain: Secondary | ICD-10-CM | POA: Diagnosis not present

## 2022-06-26 DIAGNOSIS — M25561 Pain in right knee: Secondary | ICD-10-CM | POA: Diagnosis not present

## 2022-06-26 DIAGNOSIS — I6523 Occlusion and stenosis of bilateral carotid arteries: Secondary | ICD-10-CM | POA: Diagnosis not present

## 2022-06-26 DIAGNOSIS — M1711 Unilateral primary osteoarthritis, right knee: Secondary | ICD-10-CM | POA: Diagnosis not present

## 2022-06-26 NOTE — Progress Notes (Signed)
Office Visit Note   Patient: Jane [name]           Date of Birth: 10/27/1945           MRN: [id number] Visit Date: 06/26/2022              Requested by: [name], [name], MD [address] [place],  [place] [address] PCP: [name], [name], MD   Assessment & Plan: Visit Diagnoses:  1. Primary osteoarthritis of right knee     Plan: Jane [name] does have bone-on-bone changes of the medial compartment consistent with advanced osteoarthritis.  Treatment options were reviewed and she would like to try Visco injections.  She is currently not interested in surgery.  This patient is diagnosed with osteoarthritis of the knee(s).    Radiographs show evidence of joint space narrowing, osteophytes, subchondral sclerosis and/or subchondral cysts.  This patient has knee pain which interferes with functional and activities of daily living.    This patient has experienced inadequate response, adverse effects and/or intolerance with conservative treatments such as acetaminophen, NSAIDS, topical creams, physical therapy or regular exercise, knee bracing and/or weight loss.   This patient has experienced inadequate response or has a contraindication to intra articular steroid injections for at least 3 months.   This patient is not scheduled to have a total knee replacement within 6 months of starting treatment with viscosupplementation.  Follow-Up Instructions: No follow-ups on file.   Orders:  Orders Placed This Encounter  Procedures   XR KNEE 3 VIEW RIGHT   No orders of the defined types were placed in this encounter.     Procedures: No procedures performed   Clinical Data: No additional findings.   Subjective: Chief Complaint  Patient presents with   Right Knee - Pain    HPI Patient is a 76 year old female who I have seen before for right knee pain comes in for worsening right knee pain.  She saw Dr. Letta Pate a couple months ago and had a cortisone injection which really did  not help.  She is on dialysis.  She feels constant severe medial knee pain that causes her to give out.  Occasional catching.  Denies any swelling. Review of Systems  Constitutional: Negative.   HENT: Negative.    Eyes: Negative.   Respiratory: Negative.    Cardiovascular: Negative.   Endocrine: Negative.   Musculoskeletal: Negative.   Neurological: Negative.   Hematological: Negative.   Psychiatric/Behavioral: Negative.    All other systems reviewed and are negative.    Objective: Vital Signs: There were no vitals taken for this visit.  Physical Exam Vitals and nursing note reviewed.  Constitutional:      Appearance: She is well-developed.  HENT:     Head: Normocephalic and atraumatic.  Pulmonary:     Effort: Pulmonary effort is normal.  Abdominal:     Palpations: Abdomen is soft.  Musculoskeletal:     Cervical back: Neck supple.  Skin:    General: Skin is warm.     Capillary Refill: Capillary refill takes less than 2 seconds.  Neurological:     Mental Status: She is alert and oriented to person, place, and time.  Psychiatric:        Behavior: Behavior normal.        Thought Content: Thought content normal.        Judgment: Judgment normal.     Ortho Exam Examination right knee shows medial joint line tenderness and crepitus with range of  motion.  Collateral cruciates are stable.  No joint line tenderness. Specialty Comments:  No specialty comments available.  Imaging: XR KNEE 3 VIEW RIGHT  Result Date: 06/26/2022 Three-view x-rays of the right knee shows bone-on-bone medial compartment with mild varus alignment.    PMFS History: Patient Active Problem List   Diagnosis Date Noted   Presence of Watchman left atrial appendage closure device 02/06/2022   Atrial fibrillation ([place]) 02/06/2022   Polyp of transverse colon    AVM (arteriovenous malformation) of colon, acquired with hemorrhage    Symptomatic anemia 09/26/2021   Gastritis and gastroduodenitis     Hematochezia    OSA (obstructive sleep apnea) 07/19/2021   Atherosclerosis of aorta ([place]) 05/13/2021   Unintentional weight loss 05/08/2021   Dysphagia 05/08/2021   Chronic left sacroiliac pain 05/07/2021   GI bleed 01/31/2021   Gastric intestinal metaplasia 10/18/2020   Dyspepsia 10/18/2020   Chronic constipation 10/18/2020   Nausea without vomiting 05/29/2020   Generalized osteoarthritis of multiple sites 03/27/2020   Chronic pain disorder 03/27/2020   Acute GI bleeding 01/13/2020   Duodenal ulcer with hemorrhage    Lower GI bleed 01/12/2020   Intestinal metaplasia of gastric mucosa 12/04/2019   Gastric polyp 12/04/2019   Tubulovillous adenoma of small intestine 12/03/2019   Adenomatous duodenal polyp 12/03/2019   Abnormal endoscopy of upper gastrointestinal tract 12/03/2019   Depression, major, recurrent, moderate (HCC) 09/22/2018   Orthostatic hypotension 09/16/2018   Bilateral lower extremity edema 08/24/2018   Abdominal pain, diffuse 05/11/2018   Unilateral primary osteoarthritis, right knee 03/18/2018   Chronic pain of right knee 03/18/2018   Abnormal facial hair 11/10/2017   Chronic anticoagulation 11/10/2017   GERD (gastroesophageal reflux disease) 06/16/2017   Constipation 10/16/2016   Diverticulitis of large intestine with perforation 06/26/2016   Anemia in other chronic diseases classified elsewhere 06/25/2016   Chest pain with moderate risk for cardiac etiology 06/25/2016   ESRD on hemodialysis ([place]) 06/30/2012   Hypertension with renal disease 06/30/2012   Past Medical History:  Diagnosis Date   Acid reflux    Anemia of chronic disease    Arthritis    Asthma    Atrial fibrillation (HCC)    AVM (arteriovenous malformation)    Bilateral carotid bruits    Complication of anesthesia    "hard to wake up, I have sleep apnea" no CPAP   Diverticulitis    Duodenal ulcer    Dysrhythmia    Afib   ESRD (end stage renal disease) ([place])    MWF [place]    Gallbladder sludge    GI bleed    Headache    Heart murmur    Hemorrhoids    History of blood transfusion    Hypertension    Malaise and fatigue    Orthostatic hypotension    PAF (paroxysmal atrial fibrillation) ([place])    Presence of Watchman left atrial appendage closure device 02/06/2022   Watchman FLX 26m with Dr. CBurt Knack  Shortness of breath    " when I walk to fast"   Sleep apnea    Syncope    Tubulovillous adenoma of colon     Family History  Problem Relation Age of Onset   Heart failure Mother    Stroke Mother    Other Father    Colon cancer Neg Hx    Liver disease Neg Hx    Esophageal cancer Neg Hx    Stomach cancer Neg Hx    Inflammatory bowel disease Neg  Hx    Rectal cancer Neg Hx    Pancreatic cancer Neg Hx     Past Surgical History:  Procedure Laterality Date   A/V FISTULAGRAM Right 10/18/2020   Procedure: A/V FISTULAGRAM;  Surgeon: Marty Heck, MD;  Location: [facility];  Service: Cardiovascular;  Laterality: Right;   A/V FISTULAGRAM Right 09/05/2021   Procedure: A/V Fistulagram;  Surgeon: Marty Heck, MD;  Location: [facility];  Service: Cardiovascular;  Laterality: Right;   AV FISTULA PLACEMENT     BACK SURGERY     Lumbar fusion L 4 and L 5   BIOPSY  01/09/2020   Procedure: BIOPSY;  Surgeon: Rush Landmark Telford Nab., MD;  Location: [facility];  Service: Gastroenterology;;   BIOPSY  01/31/2021   Procedure: BIOPSY;  Surgeon: Irving Copas., MD;  Location: [facility];  Service: Gastroenterology;;   BIOPSY  09/26/2021   Procedure: BIOPSY;  Surgeon: Thornton Park, MD;  Location: [facility];  Service: Gastroenterology;;   COLONOSCOPY     COLONOSCOPY N/A 02/01/2021   Procedure: COLONOSCOPY;  Surgeon: Yetta Flock, MD;  Location: [facility];  Service: Gastroenterology;  Laterality: N/A;   COLONOSCOPY WITH PROPOFOL N/A 09/26/2021   Procedure: COLONOSCOPY WITH PROPOFOL;  Surgeon: Thornton Park, MD;   Location: [facility];  Service: Gastroenterology;  Laterality: N/A;   COLONOSCOPY WITH PROPOFOL N/A 09/29/2021   Procedure: COLONOSCOPY WITH PROPOFOL;  Surgeon: Thornton Park, MD;  Location: [place];  Service: Gastroenterology;  Laterality: N/A;   COLONOSCOPY WITH PROPOFOL N/A 10/01/2021   Procedure: COLONOSCOPY WITH PROPOFOL;  Surgeon: Mauri Pole, MD;  Location: [facility];  Service: Gastroenterology;  Laterality: N/A;   ENDOSCOPIC MUCOSAL RESECTION N/A 01/09/2020   Procedure: ENDOSCOPIC MUCOSAL RESECTION;  Surgeon: Rush Landmark Telford Nab., MD;  Location: [facility] [place];  Service: Gastroenterology;  Laterality: N/A;   ENDOSCOPIC MUCOSAL RESECTION N/A 01/31/2021   Procedure: ENDOSCOPIC MUCOSAL RESECTION;  Surgeon: Rush Landmark Telford Nab., MD;  Location: [facility];  Service: Gastroenterology;  Laterality: N/A;   ENTEROSCOPY N/A 09/26/2021   Procedure: ENTEROSCOPY;  Surgeon: Thornton Park, MD;  Location: [facility];  Service: Gastroenterology;  Laterality: N/A;   ESOPHAGOGASTRODUODENOSCOPY     ESOPHAGOGASTRODUODENOSCOPY (EGD) WITH PROPOFOL N/A 01/09/2020   Procedure: ESOPHAGOGASTRODUODENOSCOPY (EGD) WITH PROPOFOL;  Surgeon: Rush Landmark Telford Nab., MD;  Location: [place] [facility];  Service: Gastroenterology;  Laterality: N/A;   ESOPHAGOGASTRODUODENOSCOPY (EGD) WITH PROPOFOL N/A 01/13/2020   Procedure: ESOPHAGOGASTRODUODENOSCOPY (EGD) WITH PROPOFOL;  Surgeon: Irene Shipper, MD;  Location: [facility];  Service: Gastroenterology;  Laterality: N/A;   ESOPHAGOGASTRODUODENOSCOPY (EGD) WITH PROPOFOL N/A 01/31/2021   Procedure: ESOPHAGOGASTRODUODENOSCOPY (EGD) WITH PROPOFOL;  Surgeon: Rush Landmark Telford Nab., MD;  Location: [facility];  Service: Gastroenterology;  Laterality: N/A;   EUS N/A 01/09/2020   Procedure: UPPER ENDOSCOPIC ULTRASOUND (EUS) RADIAL;  Surgeon: Irving Copas., MD;  Location: [facility];  Service: Gastroenterology;  Laterality: N/A;   GIVENS CAPSULE STUDY   09/29/2021   Procedure: GIVENS CAPSULE STUDY;  Surgeon: Thornton Park, MD;  Location: [facility];  Service: Gastroenterology;;   HEMOSTASIS CLIP PLACEMENT  01/09/2020   Procedure: HEMOSTASIS CLIP PLACEMENT;  Surgeon: Irving Copas., MD;  Location: [facility];  Service: Gastroenterology;;   HEMOSTASIS CLIP PLACEMENT  01/31/2021   Procedure: HEMOSTASIS CLIP PLACEMENT;  Surgeon: Irving Copas., MD;  Location: [place];  Service: Gastroenterology;;   HEMOSTASIS CONTROL  01/13/2020   Procedure: HEMOSTASIS CONTROL;  Surgeon: Irene Shipper, MD;  Location: [place];  Service: Gastroenterology;;  Vassie Loll  HOT HEMOSTASIS N/A 01/13/2020   Procedure: HOT HEMOSTASIS (ARGON PLASMA COAGULATION/BICAP);  Surgeon: Irene Shipper, MD;  Location: [facility];  Service: Gastroenterology;  Laterality: N/A;   HOT HEMOSTASIS N/A 02/01/2021   Procedure: HOT HEMOSTASIS (ARGON PLASMA COAGULATION/BICAP);  Surgeon: Yetta Flock, MD;  Location: [facility];  Service: Gastroenterology;  Laterality: N/A;   HOT HEMOSTASIS N/A 10/01/2021   Procedure: HOT HEMOSTASIS (ARGON PLASMA COAGULATION/BICAP);  Surgeon: Mauri Pole, MD;  Location: [facility];  Service: Gastroenterology;  Laterality: N/A;   IR GENERIC HISTORICAL  07/09/2016   IR US GUIDE VASC ACCESS RIGHT 07/09/2016 Arne Cleveland, MD [facility]   IR GENERIC HISTORICAL  07/09/2016   IR FLUORO GUIDE CV LINE RIGHT 07/09/2016 Arne Cleveland, MD [facility]   KNEE ARTHROPLASTY Left    LAPAROSCOPIC SIGMOID COLECTOMY N/A 07/11/2016   Procedure: LAPAROSCOPIC SIGMOID COLECTOMY;  Surgeon: Clovis Riley, MD;  Location: [facility];  Service: General;  Laterality: N/A;   LEFT ATRIAL APPENDAGE OCCLUSION N/A 02/06/2022   Procedure: LEFT ATRIAL APPENDAGE OCCLUSION;  Surgeon: Sherren Mocha, MD;  Location: [facility];  Service: Cardiovascular;  Laterality: N/A;   MASS EXCISION Right 04/10/2020   Procedure: EXCISION SKIN NODULE  RIGHT FOREARM;  Surgeon: Waynetta Sandy, MD;  Location: [facility];  Service: Vascular;  Laterality: Right;   POLYPECTOMY  09/26/2021   Procedure: POLYPECTOMY;  Surgeon: Thornton Park, MD;  Location: [facility];  Service: Gastroenterology;;   POLYPECTOMY  10/01/2021   Procedure: POLYPECTOMY;  Surgeon: Mauri Pole, MD;  Location: [facility];  Service: Gastroenterology;;   Clide Deutscher  01/09/2020   Procedure: Clide Deutscher;  Surgeon: Irving Copas., MD;  Location: [facility];  Service: Gastroenterology;;   Clide Deutscher  01/13/2020   Procedure: Clide Deutscher;  Surgeon: Irene Shipper, MD;  Location: [facility];  Service: Gastroenterology;;   Maryagnes Amos INJECTION  01/09/2020   Procedure: SUBMUCOSAL LIFTING INJECTION;  Surgeon: Irving Copas., MD;  Location: [facility];  Service: Gastroenterology;;   TEE WITHOUT CARDIOVERSION N/A 02/06/2022   Procedure: TRANSESOPHAGEAL ECHOCARDIOGRAM (TEE);  Surgeon: Sherren Mocha, MD;  Location: [facility];  Service: Cardiovascular;  Laterality: N/A;   TUBAL LIGATION     Social History   Occupational History   Occupation: Retired  Tobacco Use   Smoking status: Never    Passive exposure: Never   Smokeless tobacco: Never  Vaping Use   Vaping Use: Never used  Substance and Sexual Activity   Alcohol use: No   Drug use: No   Sexual activity: Never

## 2022-06-26 NOTE — Telephone Encounter (Signed)
Will submit 07/24/2022

## 2022-06-26 NOTE — Telephone Encounter (Signed)
Please precert for right knee gel injection for the new year. Thanks! Dr.Xu's patient.

## 2022-06-27 DIAGNOSIS — N186 End stage renal disease: Secondary | ICD-10-CM | POA: Diagnosis not present

## 2022-06-27 DIAGNOSIS — N2581 Secondary hyperparathyroidism of renal origin: Secondary | ICD-10-CM | POA: Diagnosis not present

## 2022-06-27 DIAGNOSIS — Z992 Dependence on renal dialysis: Secondary | ICD-10-CM | POA: Diagnosis not present

## 2022-06-27 DIAGNOSIS — D631 Anemia in chronic kidney disease: Secondary | ICD-10-CM | POA: Diagnosis not present

## 2022-06-30 DIAGNOSIS — D631 Anemia in chronic kidney disease: Secondary | ICD-10-CM | POA: Diagnosis not present

## 2022-06-30 DIAGNOSIS — N186 End stage renal disease: Secondary | ICD-10-CM | POA: Diagnosis not present

## 2022-06-30 DIAGNOSIS — Z992 Dependence on renal dialysis: Secondary | ICD-10-CM | POA: Diagnosis not present

## 2022-06-30 DIAGNOSIS — N2581 Secondary hyperparathyroidism of renal origin: Secondary | ICD-10-CM | POA: Diagnosis not present

## 2022-07-02 ENCOUNTER — Other Ambulatory Visit: Payer: Self-pay

## 2022-07-02 ENCOUNTER — Ambulatory Visit (INDEPENDENT_AMBULATORY_CARE_PROVIDER_SITE_OTHER): Payer: Medicare Other | Admitting: Vascular Surgery

## 2022-07-02 ENCOUNTER — Encounter: Payer: Self-pay | Admitting: Vascular Surgery

## 2022-07-02 VITALS — BP 174/73 | HR 58 | Temp 98.3°F | Resp 20 | Ht 65.0 in | Wt 196.0 lb

## 2022-07-02 DIAGNOSIS — N186 End stage renal disease: Secondary | ICD-10-CM

## 2022-07-02 DIAGNOSIS — Z992 Dependence on renal dialysis: Secondary | ICD-10-CM | POA: Diagnosis not present

## 2022-07-02 DIAGNOSIS — D631 Anemia in chronic kidney disease: Secondary | ICD-10-CM | POA: Diagnosis not present

## 2022-07-02 DIAGNOSIS — I4891 Unspecified atrial fibrillation: Secondary | ICD-10-CM | POA: Diagnosis not present

## 2022-07-02 DIAGNOSIS — N2581 Secondary hyperparathyroidism of renal origin: Secondary | ICD-10-CM | POA: Diagnosis not present

## 2022-07-02 NOTE — Progress Notes (Signed)
[name], female   DOB: 1945/11/05, 76 y.o.   MRN: [id number]  Reason for Consult: Follow-up   Referred by Rosita Fire, *  Subjective:     HPI:  Jane [name] is a 76 y.o. female has a history of end-stage renal disease on dialysis Mondays, Wednesdays and Fridays.  She is on aspirin and Plavix.  For the past 4 weeks she has had a bleeding area on her right forearm fistula.  She did actually did call EMS 1 time but by the time they arrived it had stopped.  She did dialyze with the fistula earlier today without any issues.  She is keeping a pressure dressing on it to prevent it from bleeding.  She does not take any blood thinners.  Past Medical History:  Diagnosis Date   Acid reflux    Anemia of chronic disease    Arthritis    Asthma    Atrial fibrillation (HCC)    AVM (arteriovenous malformation)    Bilateral carotid bruits    Complication of anesthesia    "hard to wake up, I have sleep apnea" no CPAP   Diverticulitis    Duodenal ulcer    Dysrhythmia    Afib   ESRD (end stage renal disease) ([place])    MWF [facility]   Gallbladder sludge    GI bleed    Headache    Heart murmur    Hemorrhoids    History of blood transfusion    Hypertension    Malaise and fatigue    Orthostatic hypotension    PAF (paroxysmal atrial fibrillation) ([place])    Presence of Watchman left atrial appendage closure device 02/06/2022   Watchman FLX 32m with Dr. CBurt Knack  Shortness of breath    " when I walk to fast"   Sleep apnea    Syncope    Tubulovillous adenoma of colon    Family History  Problem Relation Age of Onset   Heart failure Mother    Stroke Mother    Other Father    Colon cancer Neg Hx    Liver disease Neg Hx    Esophageal cancer Neg Hx    Stomach cancer Neg Hx    Inflammatory bowel disease Neg Hx    Rectal cancer Neg Hx    Pancreatic cancer Neg Hx    Past Surgical History:  Procedure Laterality Date   A/V FISTULAGRAM Right 10/18/2020    Procedure: A/V FISTULAGRAM;  Surgeon: CMarty Heck MD;  [facility];  Service: Cardiovascular;  Laterality: Right;   A/V FISTULAGRAM Right 09/05/2021   Procedure: A/V Fistulagram;  Surgeon: CMarty Heck MD;  [facility];  Service: Cardiovascular;  Laterality: Right;   AV FISTULA PLACEMENT     BACK SURGERY     Lumbar fusion L 4 and L 5   BIOPSY  01/09/2020   Procedure: BIOPSY;  Surgeon: MRush LandmarkGTelford Nab, MD;  Location: [facility]  Service: Gastroenterology;;   BIOPSY  01/31/2021   Procedure: BIOPSY;  Surgeon: MIrving Copas, MD;  [place] [facility]  Service: Gastroenterology;;   BIOPSY  09/26/2021   Procedure: BIOPSY;  Surgeon: BThornton Park MD;  [place]  Service: Gastroenterology;;   COLONOSCOPY     COLONOSCOPY N/A 02/01/2021   Procedure: COLONOSCOPY;  Surgeon: AYetta Flock MD;  [place] [facility]  Service: Gastroenterology;  Laterality: N/A;   COLONOSCOPY WITH PROPOFOL N/A 09/26/2021  Procedure: COLONOSCOPY WITH PROPOFOL;  Surgeon: Thornton Park, MD;  Location: [place];  Service: Gastroenterology;  Laterality: N/A;   COLONOSCOPY WITH PROPOFOL N/A 09/29/2021   Procedure: COLONOSCOPY WITH PROPOFOL;  Surgeon: Thornton Park, MD;  Location: [place];  Service: Gastroenterology;  Laterality: N/A;   COLONOSCOPY WITH PROPOFOL N/A 10/01/2021   Procedure: COLONOSCOPY WITH PROPOFOL;  Surgeon: Mauri Pole, MD;  Location: [place];  Service: Gastroenterology;  Laterality: N/A;   ENDOSCOPIC MUCOSAL RESECTION N/A 01/09/2020   Procedure: ENDOSCOPIC MUCOSAL RESECTION;  Surgeon: Rush Landmark Telford Nab., MD;  Location: [place];  Service: Gastroenterology;  Laterality: N/A;   ENDOSCOPIC MUCOSAL RESECTION N/A 01/31/2021   Procedure: ENDOSCOPIC MUCOSAL RESECTION;  Surgeon: Rush Landmark Telford Nab., MD;  Location: [place];  Service: Gastroenterology;  Laterality: N/A;    ENTEROSCOPY N/A 09/26/2021   Procedure: ENTEROSCOPY;  Surgeon: Thornton Park, MD;  Location: [place];  Service: Gastroenterology;  Laterality: N/A;   ESOPHAGOGASTRODUODENOSCOPY     ESOPHAGOGASTRODUODENOSCOPY (EGD) WITH PROPOFOL N/A 01/09/2020   Procedure: ESOPHAGOGASTRODUODENOSCOPY (EGD) WITH PROPOFOL;  Surgeon: Rush Landmark Telford Nab., MD;  Location: [place];  Service: Gastroenterology;  Laterality: N/A;   ESOPHAGOGASTRODUODENOSCOPY (EGD) WITH PROPOFOL N/A 01/13/2020   Procedure: ESOPHAGOGASTRODUODENOSCOPY (EGD) WITH PROPOFOL;  Surgeon: Irene Shipper, MD;  Location: [place];  Service: Gastroenterology;  Laterality: N/A;   ESOPHAGOGASTRODUODENOSCOPY (EGD) WITH PROPOFOL N/A 01/31/2021   Procedure: ESOPHAGOGASTRODUODENOSCOPY (EGD) WITH PROPOFOL;  Surgeon: Rush Landmark Telford Nab., MD;  Location: [place];  Service: Gastroenterology;  Laterality: N/A;   EUS N/A 01/09/2020   Procedure: UPPER ENDOSCOPIC ULTRASOUND (EUS) RADIAL;  Surgeon: Irving Copas., MD;  Location: [place];  Service: Gastroenterology;  Laterality: N/A;   GIVENS CAPSULE STUDY  09/29/2021   Procedure: GIVENS CAPSULE STUDY;  Surgeon: Thornton Park, MD;  Location: [place];  Service: Gastroenterology;;   HEMOSTASIS CLIP PLACEMENT  01/09/2020   Procedure: HEMOSTASIS CLIP PLACEMENT;  Surgeon: Irving Copas., MD;  Location: [place];  Service: Gastroenterology;;   HEMOSTASIS CLIP PLACEMENT  01/31/2021   Procedure: HEMOSTASIS CLIP PLACEMENT;  Surgeon: Irving Copas., MD;  Location: [place];  Service: Gastroenterology;;   HEMOSTASIS CONTROL  01/13/2020   Procedure: HEMOSTASIS CONTROL;  Surgeon: Irene Shipper, MD;  Location: [place];  Service: Gastroenterology;;  hemaspray   HOT HEMOSTASIS N/A 01/13/2020   Procedure: HOT HEMOSTASIS (ARGON PLASMA COAGULATION/BICAP);  Surgeon: Irene Shipper, MD;  Location: [place];  Service: Gastroenterology;  Laterality: N/A;   HOT  HEMOSTASIS N/A 02/01/2021   Procedure: HOT HEMOSTASIS (ARGON PLASMA COAGULATION/BICAP);  Surgeon: Yetta Flock, MD;  Location: [facility];  Service: Gastroenterology;  Laterality: N/A;   HOT HEMOSTASIS N/A 10/01/2021   Procedure: HOT HEMOSTASIS (ARGON PLASMA COAGULATION/BICAP);  Surgeon: Mauri Pole, MD;  Location: [place];  Service: Gastroenterology;  Laterality: N/A;   IR GENERIC HISTORICAL  07/09/2016   IR US GUIDE VASC ACCESS RIGHT 07/09/2016 Arne Cleveland, MD [facility]   IR GENERIC HISTORICAL  07/09/2016   IR FLUORO GUIDE CV LINE RIGHT 07/09/2016 Arne Cleveland, MD [facility]   KNEE ARTHROPLASTY Left    LAPAROSCOPIC SIGMOID COLECTOMY N/A 07/11/2016   Procedure: LAPAROSCOPIC SIGMOID COLECTOMY;  Surgeon: Clovis Riley, MD;  Location: [place];  Service: General;  Laterality: N/A;   LEFT ATRIAL APPENDAGE OCCLUSION N/A 02/06/2022   Procedure: LEFT ATRIAL APPENDAGE OCCLUSION;  Surgeon: Sherren Mocha, MD;  Location: [place] [facility];  Service: Cardiovascular;  Laterality: N/A;   MASS EXCISION Right 04/10/2020   Procedure: EXCISION SKIN  NODULE RIGHT FOREARM;  Surgeon: Waynetta Sandy, MD;  Location: [place];  Service: Vascular;  Laterality: Right;   POLYPECTOMY  09/26/2021   Procedure: POLYPECTOMY;  Surgeon: Thornton Park, MD;  Location: [place];  Service: Gastroenterology;;   POLYPECTOMY  10/01/2021   Procedure: POLYPECTOMY;  Surgeon: Mauri Pole, MD;  Location: [place];  Service: Gastroenterology;;   Clide Deutscher  01/09/2020   Procedure: Clide Deutscher;  Surgeon: Irving Copas., MD;  Location: [place];  Service: Gastroenterology;;   Clide Deutscher  01/13/2020   Procedure: Clide Deutscher;  Surgeon: Irene Shipper, MD;  Location: [place];  Service: Gastroenterology;;   Maryagnes Amos INJECTION  01/09/2020   Procedure: SUBMUCOSAL LIFTING INJECTION;  Surgeon: Irving Copas., MD;  Location: [place];  Service:  Gastroenterology;;   TEE WITHOUT CARDIOVERSION N/A 02/06/2022   Procedure: TRANSESOPHAGEAL ECHOCARDIOGRAM (TEE);  Surgeon: Sherren Mocha, MD;  Location: [place] [facility];  Service: Cardiovascular;  Laterality: N/A;   TUBAL LIGATION      Short Social History:  Social History   Tobacco Use   Smoking status: Never    Passive exposure: Never   Smokeless tobacco: Never  Substance Use Topics   Alcohol use: No    Allergies  Allergen Reactions   Latex Rash   Penicillins Other (See Comments)    Yeast infection / Childhood   Sulfa Antibiotics Rash   Tape Other (See Comments)    Plastic, silicone, and paper tape causes bruising and pulls off skin. Cloth tape works fine   Reglan [Metoclopramide] Itching    Itchiness, bug crawling sensation     Current Outpatient Medications  Medication Sig Dispense Refill   aspirin EC 81 MG tablet Take 1 tablet (81 mg total) by mouth daily. Swallow whole. 30 tablet 12   atorvastatin (LIPITOR) 10 MG tablet Take 1 tablet (10 mg total) by mouth daily. 90 tablet 1   B Complex-C-Zn-Folic Acid (DIALYVITE 354 WITH ZINC) 0.8 MG TABS Take 1 tablet by mouth at bedtime.     carvedilol (COREG) 6.25 MG tablet Take 12.5 mg by mouth 2 (two) times daily. 180 tablet 3   cinacalcet (SENSIPAR) 30 MG tablet Take 30 mg by mouth every Monday, Wednesday, and Friday with hemodialysis. Post dialysis     clopidogrel (PLAVIX) 75 MG tablet Take 1 tablet (75 mg total) by mouth daily. 90 tablet 1   diltiazem (CARDIZEM) 60 MG tablet Take 1 tablet (60 mg total) by mouth 3 (three) times daily as needed (Atrial fibrillation). 30 tablet 1   ethyl chloride spray Apply 1 application topically every Monday, Wednesday, and Friday with hemodialysis.  12   famotidine (PEPCID) 40 MG tablet Take 1 tablet (40 mg total) by mouth 2 (two) times daily. 180 tablet 1   lansoprazole (PREVACID) 30 MG capsule Take 1 capsule (30 mg total) by mouth 2 (two) times daily. 60 capsule 2   LINZESS 72 MCG  capsule TAKE ONE CAPSULE BY MOUTH BEFORE BREAKFAST 30 capsule 2   Methoxy PEG-Epoetin Beta (MIRCERA IJ) Inject 1 Dose into the vein every Monday, Wednesday, and Friday with hemodialysis.     sevelamer carbonate (RENVELA) 800 MG tablet Take 1,600 mg by mouth in the morning and at bedtime. With meals take 800 mg with snack     Current Facility-Administered Medications  Medication Dose Route Frequency Provider Last Rate Last Admin   betamethasone acetate-betamethasone sodium phosphate (CELESTONE) injection 12 mg  12 mg Intramuscular Once Kirsteins, Luanna Salk, MD  lidocaine (XYLOCAINE) 1 % (with pres) injection 4 mL  4 mL Other Once Kirsteins, Luanna Salk, MD        Review of Systems  Constitutional:  Constitutional negative. HENT: HENT negative.  Cardiovascular: Cardiovascular negative.  GI: Gastrointestinal negative.  Skin: Positive for wound.  Neurological: Neurological negative. Hematologic: Positive for bruises/bleeds easily.        Objective:  Objective   Vitals:   07/02/22 1028  BP: (!) 174/73  Pulse: (!) 58  Resp: 20  Temp: 98.3 F (36.8 C)  SpO2: 93%  Weight: 196 lb (88.9 kg)  Height: '5\' 5"'$  (1.651 m)   Body mass index is 32.62 kg/m.  Physical Exam HENT:     Head: Normocephalic.  Eyes:     Pupils: Pupils are equal, round, and reactive to light.  Cardiovascular:     Rate and Rhythm: Normal rate.  Pulmonary:     Effort: Pulmonary effort is normal.  Abdominal:     General: Abdomen is flat.  Musculoskeletal:     Cervical back: Normal range of motion.  Skin:    Comments: Right forearm AV fistula has a 1 x 0.5 cm area of ulceration  Neurological:     General: No focal deficit present.     Mental Status: She is alert.  Psychiatric:        Mood and Affect: Mood normal.        Thought Content: Thought content normal.     Data: No studies today.  Previous fistulogram reviewed     Assessment/Plan:     76 year old female on dialysis via right forearm AV  fistula with 1 area of ulceration that has bled requiring EMS evaluation but no hospital presentation.  We discussed the need for surgical repair patient would prefer to wait till next Tuesday which I think is safe so long as with any further bleeding issues she seek emergent medical attention and she is agreeable to do this.  She can continue to use the fistula sticking outside of this ulcerated area.  I discussed the possible need for catheter at the time of surgery and she demonstrates good understanding we will get her scheduled for next week.     Waynetta Sandy MD [facility]

## 2022-07-04 ENCOUNTER — Emergency Department (HOSPITAL_BASED_OUTPATIENT_CLINIC_OR_DEPARTMENT_OTHER): Payer: Medicare Other | Admitting: Certified Registered Nurse Anesthetist

## 2022-07-04 ENCOUNTER — Encounter (HOSPITAL_COMMUNITY)
Admission: EM | Disposition: A | Discharge status: Home / Self Care | Payer: Self-pay | Source: Home / Self Care | Attending: Emergency Medicine

## 2022-07-04 ENCOUNTER — Observation Stay (HOSPITAL_COMMUNITY)
Admission: EM | Admit: 2022-07-04 | Discharge: 2022-07-05 | Disposition: A | Discharge status: Home / Self Care | Payer: Medicare Other | Attending: Vascular Surgery | Admitting: Vascular Surgery

## 2022-07-04 ENCOUNTER — Other Ambulatory Visit: Payer: Self-pay

## 2022-07-04 ENCOUNTER — Encounter (HOSPITAL_COMMUNITY): Payer: Self-pay | Admitting: Certified Registered Nurse Anesthetist

## 2022-07-04 ENCOUNTER — Emergency Department (HOSPITAL_COMMUNITY): Payer: Medicare Other | Admitting: Certified Registered Nurse Anesthetist

## 2022-07-04 DIAGNOSIS — N185 Chronic kidney disease, stage 5: Secondary | ICD-10-CM

## 2022-07-04 DIAGNOSIS — J45909 Unspecified asthma, uncomplicated: Secondary | ICD-10-CM | POA: Diagnosis not present

## 2022-07-04 DIAGNOSIS — R110 Nausea: Secondary | ICD-10-CM | POA: Diagnosis not present

## 2022-07-04 DIAGNOSIS — I120 Hypertensive chronic kidney disease with stage 5 chronic kidney disease or end stage renal disease: Secondary | ICD-10-CM | POA: Insufficient documentation

## 2022-07-04 DIAGNOSIS — Z91040 Latex allergy status: Secondary | ICD-10-CM | POA: Insufficient documentation

## 2022-07-04 DIAGNOSIS — I480 Paroxysmal atrial fibrillation: Secondary | ICD-10-CM | POA: Diagnosis not present

## 2022-07-04 DIAGNOSIS — D631 Anemia in chronic kidney disease: Secondary | ICD-10-CM | POA: Diagnosis not present

## 2022-07-04 DIAGNOSIS — Z7982 Long term (current) use of aspirin: Secondary | ICD-10-CM | POA: Insufficient documentation

## 2022-07-04 DIAGNOSIS — T82838A Hemorrhage of vascular prosthetic devices, implants and grafts, initial encounter: Principal | ICD-10-CM | POA: Insufficient documentation

## 2022-07-04 DIAGNOSIS — Z9889 Other specified postprocedural states: Secondary | ICD-10-CM | POA: Diagnosis not present

## 2022-07-04 DIAGNOSIS — Z7902 Long term (current) use of antithrombotics/antiplatelets: Secondary | ICD-10-CM | POA: Diagnosis not present

## 2022-07-04 DIAGNOSIS — Z992 Dependence on renal dialysis: Secondary | ICD-10-CM | POA: Diagnosis not present

## 2022-07-04 DIAGNOSIS — R58 Hemorrhage, not elsewhere classified: Secondary | ICD-10-CM | POA: Diagnosis not present

## 2022-07-04 DIAGNOSIS — N2581 Secondary hyperparathyroidism of renal origin: Secondary | ICD-10-CM | POA: Diagnosis not present

## 2022-07-04 DIAGNOSIS — Z79899 Other long term (current) drug therapy: Secondary | ICD-10-CM | POA: Diagnosis not present

## 2022-07-04 DIAGNOSIS — N186 End stage renal disease: Secondary | ICD-10-CM | POA: Insufficient documentation

## 2022-07-04 DIAGNOSIS — I10 Essential (primary) hypertension: Secondary | ICD-10-CM | POA: Diagnosis not present

## 2022-07-04 HISTORY — PX: REVISON OF ARTERIOVENOUS FISTULA: SHX6074

## 2022-07-04 LAB — APTT: aPTT: 33 seconds (ref 24–36)

## 2022-07-04 LAB — CBC WITH DIFFERENTIAL/PLATELET
Abs Immature Granulocytes: 0.02 10*3/uL (ref 0.00–0.07)
Basophils Absolute: 0 10*3/uL (ref 0.0–0.1)
Basophils Relative: 0 %
Eosinophils Absolute: 0.4 10*3/uL (ref 0.0–0.5)
Eosinophils Relative: 6 %
HCT: 39.8 % (ref 36.0–46.0)
Hemoglobin: 11.7 g/dL — ABNORMAL LOW (ref 12.0–15.0)
Immature Granulocytes: 0 %
Lymphocytes Relative: 12 %
Lymphs Abs: 0.7 10*3/uL (ref 0.7–4.0)
MCH: 28.9 pg (ref 26.0–34.0)
MCHC: 29.4 g/dL — ABNORMAL LOW (ref 30.0–36.0)
MCV: 98.3 fL (ref 80.0–100.0)
Monocytes Absolute: 0.6 10*3/uL (ref 0.1–1.0)
Monocytes Relative: 11 %
Neutro Abs: 4.1 10*3/uL (ref 1.7–7.7)
Neutrophils Relative %: 71 %
Platelets: 45 10*3/uL — ABNORMAL LOW (ref 150–400)
RBC: 4.05 MIL/uL (ref 3.87–5.11)
RDW: 17.9 % — ABNORMAL HIGH (ref 11.5–15.5)
WBC: 5.8 10*3/uL (ref 4.0–10.5)
nRBC: 0 % (ref 0.0–0.2)

## 2022-07-04 LAB — COMPREHENSIVE METABOLIC PANEL
ALT: 15 U/L (ref 0–44)
AST: 23 U/L (ref 15–41)
Albumin: 3.4 g/dL — ABNORMAL LOW (ref 3.5–5.0)
Alkaline Phosphatase: 231 U/L — ABNORMAL HIGH (ref 38–126)
Anion gap: 11 (ref 5–15)
BUN: 10 mg/dL (ref 8–23)
CO2: 27 mmol/L (ref 22–32)
Calcium: 8.7 mg/dL — ABNORMAL LOW (ref 8.9–10.3)
Chloride: 101 mmol/L (ref 98–111)
Creatinine, Ser: 5.93 mg/dL — ABNORMAL HIGH (ref 0.44–1.00)
GFR, Estimated: 7 mL/min — ABNORMAL LOW (ref >60)
Glucose, Bld: 96 mg/dL (ref 70–99)
Potassium: 4.5 mmol/L (ref 3.5–5.1)
Sodium: 139 mmol/L (ref 135–145)
Total Bilirubin: 0.4 mg/dL (ref 0.3–1.2)
Total Protein: 7.3 g/dL (ref 6.5–8.1)

## 2022-07-04 LAB — PROTIME-INR
INR: 1.2 (ref 0.8–1.2)
Prothrombin Time: 15.3 seconds — ABNORMAL HIGH (ref 11.4–15.2)

## 2022-07-04 SURGERY — REVISON OF ARTERIOVENOUS FISTULA
Anesthesia: General | Site: Arm Lower | Laterality: Right

## 2022-07-04 MED ORDER — EPHEDRINE SULFATE (PRESSORS) 50 MG/ML IJ SOLN
INTRAMUSCULAR | Status: DC | PRN
  Administered 2022-07-04: 5 mg via INTRAVENOUS
  Administered 2022-07-04: 2.5 mg via INTRAVENOUS

## 2022-07-04 MED ORDER — HYDROMORPHONE HCL 1 MG/ML IJ SOLN: 0.5000 mg | INTRAMUSCULAR | Status: DC | PRN

## 2022-07-04 MED ORDER — SUCCINYLCHOLINE CHLORIDE 200 MG/10ML IV SOSY
PREFILLED_SYRINGE | INTRAVENOUS | Status: AC
  Filled 2022-07-04: qty 10

## 2022-07-04 MED ORDER — PROPOFOL 10 MG/ML IV BOLUS
INTRAVENOUS | Status: AC
  Filled 2022-07-04: qty 20

## 2022-07-04 MED ORDER — DEXAMETHASONE SODIUM PHOSPHATE 10 MG/ML IJ SOLN
INTRAMUSCULAR | Status: DC | PRN
  Administered 2022-07-04: 5 mg via INTRAVENOUS

## 2022-07-04 MED ORDER — PROPOFOL 10 MG/ML IV BOLUS
INTRAVENOUS | Status: DC | PRN
  Administered 2022-07-04: 110 mg via INTRAVENOUS
  Administered 2022-07-04: 40 mg via INTRAVENOUS
  Administered 2022-07-04: 20 mg via INTRAVENOUS

## 2022-07-04 MED ORDER — HEPARIN 6000 UNIT IRRIGATION SOLUTION
Status: AC
  Filled 2022-07-04: qty 500

## 2022-07-04 MED ORDER — HEPARIN SODIUM (PORCINE) 1000 UNIT/ML IJ SOLN
INTRAMUSCULAR | Status: AC
  Filled 2022-07-04: qty 10

## 2022-07-04 MED ORDER — CEFAZOLIN SODIUM-DEXTROSE 2-3 GM-%(50ML) IV SOLR
INTRAVENOUS | Status: DC | PRN
  Administered 2022-07-04: 2 g via INTRAVENOUS

## 2022-07-04 MED ORDER — SODIUM CHLORIDE 0.9 % IV SOLN: INTRAVENOUS | Status: DC | PRN

## 2022-07-04 MED ORDER — PHENYLEPHRINE HCL (PRESSORS) 10 MG/ML IV SOLN
INTRAVENOUS | Status: DC | PRN
  Administered 2022-07-04 (×2): 160 ug via INTRAVENOUS

## 2022-07-04 MED ORDER — LIDOCAINE 2% (20 MG/ML) 5 ML SYRINGE
INTRAMUSCULAR | Status: AC
  Filled 2022-07-04: qty 5

## 2022-07-04 MED ORDER — HEPARIN 6000 UNIT IRRIGATION SOLUTION
Status: DC | PRN
  Administered 2022-07-04: 1

## 2022-07-04 MED ORDER — LIDOCAINE HCL (PF) 1 % IJ SOLN
INTRAMUSCULAR | Status: AC
  Filled 2022-07-04: qty 30

## 2022-07-04 MED ORDER — FENTANYL CITRATE (PF) 100 MCG/2ML IJ SOLN
INTRAMUSCULAR | Status: DC | PRN
  Administered 2022-07-04: 50 ug via INTRAVENOUS
  Administered 2022-07-04: 25 ug via INTRAVENOUS

## 2022-07-04 MED ORDER — 0.9 % SODIUM CHLORIDE (POUR BTL) OPTIME
TOPICAL | Status: DC | PRN
  Administered 2022-07-04: 1000 mL

## 2022-07-04 MED ORDER — SUCCINYLCHOLINE CHLORIDE 200 MG/10ML IV SOSY
PREFILLED_SYRINGE | INTRAVENOUS | Status: DC | PRN
  Administered 2022-07-04: 120 mg via INTRAVENOUS

## 2022-07-04 MED ORDER — FENTANYL CITRATE (PF) 250 MCG/5ML IJ SOLN
INTRAMUSCULAR | Status: AC
  Filled 2022-07-04: qty 5

## 2022-07-04 MED ORDER — ONDANSETRON HCL 4 MG/2ML IJ SOLN
INTRAMUSCULAR | Status: DC | PRN
  Administered 2022-07-04: 4 mg via INTRAVENOUS

## 2022-07-04 MED ORDER — LIDOCAINE-EPINEPHRINE 1 %-1:100000 IJ SOLN
10.0000 mL | Freq: Once | INTRAMUSCULAR | Status: DC
  Filled 2022-07-04: qty 1

## 2022-07-04 MED ORDER — LIDOCAINE HCL (CARDIAC) PF 100 MG/5ML IV SOSY
PREFILLED_SYRINGE | INTRAVENOUS | Status: DC | PRN
  Administered 2022-07-04: 60 mg via INTRAVENOUS

## 2022-07-04 MED ORDER — ONDANSETRON HCL 4 MG/2ML IJ SOLN
INTRAMUSCULAR | Status: AC
  Filled 2022-07-04: qty 2

## 2022-07-04 SURGICAL SUPPLY — 33 items
ARMBAND PINK RESTRICT EXTREMIT (MISCELLANEOUS) ×1 IMPLANT
BAG COUNTER SPONGE SURGICOUNT (BAG) ×1 IMPLANT
BENZOIN TINCTURE PRP APPL 2/3 (GAUZE/BANDAGES/DRESSINGS) ×1 IMPLANT
CANISTER SUCT 3000ML PPV (MISCELLANEOUS) ×1 IMPLANT
CANNULA VESSEL 3MM 2 BLNT TIP (CANNULA) ×1 IMPLANT
CHLORAPREP W/TINT 26 (MISCELLANEOUS) ×1 IMPLANT
COVER PROBE W GEL 5X96 (DRAPES) IMPLANT
ELECT REM PT RETURN 9FT ADLT (ELECTROSURGICAL) ×1
ELECTRODE REM PT RTRN 9FT ADLT (ELECTROSURGICAL) ×1 IMPLANT
GLOVE BIO SURGEON STRL SZ8 (GLOVE) ×1 IMPLANT
GOWN STRL REUS W/ TWL LRG LVL3 (GOWN DISPOSABLE) ×2 IMPLANT
GOWN STRL REUS W/ TWL XL LVL3 (GOWN DISPOSABLE) ×1 IMPLANT
GOWN STRL REUS W/TWL LRG LVL3 (GOWN DISPOSABLE) ×2
GOWN STRL REUS W/TWL XL LVL3 (GOWN DISPOSABLE) ×1
INSERT FOGARTY SM (MISCELLANEOUS) IMPLANT
KIT BASIN OR (CUSTOM PROCEDURE TRAY) ×1 IMPLANT
KIT TURNOVER KIT B (KITS) ×1 IMPLANT
NDL 18GX1X1/2 (RX/OR ONLY) (NEEDLE) ×1 IMPLANT
NEEDLE 18GX1X1/2 (RX/OR ONLY) (NEEDLE) ×1 IMPLANT
NS IRRIG 1000ML POUR BTL (IV SOLUTION) ×1 IMPLANT
PACK CV ACCESS (CUSTOM PROCEDURE TRAY) ×1 IMPLANT
PAD ARMBOARD 7.5X6 YLW CONV (MISCELLANEOUS) ×2 IMPLANT
SLING ARM FOAM STRAP LRG (SOFTGOODS) IMPLANT
SLING ARM FOAM STRAP MED (SOFTGOODS) IMPLANT
STRIP CLOSURE SKIN 1/2X4 (GAUZE/BANDAGES/DRESSINGS) ×1 IMPLANT
SUT MNCRL AB 4-0 PS2 18 (SUTURE) ×1 IMPLANT
SUT PROLENE 6 0 BV (SUTURE) ×1 IMPLANT
SUT VIC AB 3-0 SH 27 (SUTURE) ×1
SUT VIC AB 3-0 SH 27X BRD (SUTURE) ×1 IMPLANT
SYR 3ML LL SCALE MARK (SYRINGE) ×1 IMPLANT
TOWEL GREEN STERILE (TOWEL DISPOSABLE) ×1 IMPLANT
UNDERPAD 30X36 HEAVY ABSORB (UNDERPADS AND DIAPERS) ×1 IMPLANT
WATER STERILE IRR 1000ML POUR (IV SOLUTION) ×1 IMPLANT

## 2022-07-04 NOTE — ED Triage Notes (Signed)
BIB EMS for fistula bleeding after Dialysis completion. Approximately 0630p started bleeding nonstop. Happened before last week. On blood thinners

## 2022-07-04 NOTE — Op Note (Signed)
DATE OF SERVICE: 07/04/2022  PATIENT:  Jane [name]  76 y.o. female  PRE-OPERATIVE DIAGNOSIS:  bleeding from right arm arteriovenous fistula  POST-OPERATIVE DIAGNOSIS:  Same  PROCEDURE:   Revision of right arm arteriovenous fistula  SURGEON:  Surgeon(s) and Role:    * Cherre Robins, MD - Primary  ASSISTANT: Bloomfield Cellar, RNFA  ANESTHESIA:   general  EBL: minimal  BLOOD ADMINISTERED:none  DRAINS: none   LOCAL MEDICATIONS USED:  NONE  SPECIMEN:  none  COUNTS: confirmed correct.  TOURNIQUET:    Total Tourniquet Time Documented: Upper Arm (Right) - 18 minutes Total: Upper Arm (Right) - 18 minutes   PATIENT DISPOSITION:  PACU - hemodynamically stable.   Delay start of Pharmacological VTE agent (>24hrs) due to surgical blood loss or risk of bleeding: no  INDICATION FOR PROCEDURE: Jane [name] is a 76 y.o. female with bleeding from an ulcerated right arm arteriovenous fistula. After careful discussion of risks, benefits, and alternatives the patient was offered revision. The patient understood and wished to proceed.  OPERATIVE FINDINGS: 1.5cm x 1.5cm ulcer communicated freely with fistula below. Repaired fistula under tourniquet control. Fistula pulsatile, but palpable under the skin for > 1 hand breadth.   DESCRIPTION OF PROCEDURE: After identification of the patient in the pre-operative holding area, the patient was transferred to the operating room. The patient was positioned supine on the operating room table. Anesthesia was induced.  The right arm was exsanguinated with an Esmarch tourniquet.  A pneumatic tourniquet was inflated in the upper arm.  The right arm was prepped and draped in standard fashion. A surgical pause was performed confirming correct patient, procedure, and operative location.  An ellipse incision was made about the ulcerated area in the fistula.  The fistula was skeletonized partially to free it from its subcutaneous attachments.  I repaired the  fistula defect in 2 layers using a 5-0 Prolene suture.  After the repair, the pneumatic tourniquet was released.  Good hemostasis was noted.  No repair stitch was needed.  The skin was closed using 3-0 Vicryl and a skin stapler.  There was greater than 10 cm of palpable fistula in the forearm, and so I elected not to place a tunneled dialysis catheter. A sterile bandage was applied.  Upon completion of the case instrument and sharps counts were confirmed correct. The patient was transferred to the PACU in good condition. I was present for all portions of the procedure.  Jane [name]. Jane Breed, MD [facility] Phone Number: [phone] 07/04/2022 11:53 PM

## 2022-07-04 NOTE — Consult Note (Addendum)
[facility] [place]  ASSESSMENT / PLAN: 76 y.o. female with bleeding right forearm arteriovenous fistula.  Will plan urgent repair in the operating room tonight.  May need to place tunneled dialysis catheter.  Discussed this with the patient.  She is amenable to proceeding.  CHIEF COMPLAINT: Bleeding fistula  HISTORY OF PRESENT ILLNESS: Jane [name] is a 76 y.o. female with end-stage renal disease dialyzing Monday, Wednesday, and Friday through a right forearm arteriovenous fistula.  She was offered more rapir repair by Dr. Donzetta Matters, but preferred to delay until next week.  She developed bleeding after dialysis today. ER staff was able to control bleeding with a pressure bandage.   Past Medical History:  Diagnosis Date   Acid reflux    Anemia of chronic disease    Arthritis    Asthma    Atrial fibrillation (HCC)    AVM (arteriovenous malformation)    Bilateral carotid bruits    Complication of anesthesia    "hard to wake up, I have sleep apnea" no CPAP   Diverticulitis    Duodenal ulcer    Dysrhythmia    Afib   ESRD (end stage renal disease) ([place])    MWF [place]   Gallbladder sludge    GI bleed    Headache    Heart murmur    Hemorrhoids    History of blood transfusion    Hypertension    Malaise and fatigue    Orthostatic hypotension    PAF (paroxysmal atrial fibrillation) ([place])    Presence of Watchman left atrial appendage closure device 02/06/2022   Watchman FLX 6m with Dr. CBurt Knack  Shortness of breath    " when I walk to fast"   Sleep apnea    Syncope    Tubulovillous adenoma of colon     Past Surgical History:  Procedure Laterality Date   A/V FISTULAGRAM Right 10/18/2020   Procedure: A/V FISTULAGRAM;  Surgeon: CMarty Heck MD;  [facility];  Service: Cardiovascular;  Laterality: Right;   A/V FISTULAGRAM Right 09/05/2021   Procedure: A/V Fistulagram;  Surgeon: CMarty Heck MD;  [facility];  Service: Cardiovascular;  Laterality: Right;   AV FISTULA PLACEMENT     BACK SURGERY     Lumbar fusion L 4 and L 5   BIOPSY  01/09/2020   Procedure: BIOPSY;  Surgeon: MRush LandmarkGTelford Nab, MD;  [place] [facility]  Service: Gastroenterology;;   BIOPSY  01/31/2021   Procedure: BIOPSY;  Surgeon: MIrving Copas, MD;  [place] [facility]  Service: Gastroenterology;;   BIOPSY  09/26/2021   Procedure: BIOPSY;  Surgeon: BThornton Park MD;  [place] [facility]  Service: Gastroenterology;;   COLONOSCOPY     COLONOSCOPY N/A 02/01/2021   Procedure: COLONOSCOPY;  Surgeon: AYetta Flock MD;  [place] [facility]  Service: Gastroenterology;  Laterality: N/A;   COLONOSCOPY WITH PROPOFOL N/A 09/26/2021   Procedure: COLONOSCOPY WITH PROPOFOL;  Surgeon: BThornton Park MD;  [place]  Service: Gastroenterology;  Laterality: N/A;   COLONOSCOPY WITH PROPOFOL N/A 09/29/2021   Procedure: COLONOSCOPY WITH PROPOFOL;  Surgeon: BThornton Park MD;  [place] [facility]  Service: Gastroenterology;  Laterality: N/A;   COLONOSCOPY WITH PROPOFOL N/A 10/01/2021   Procedure: COLONOSCOPY WITH PROPOFOL;  Surgeon: NMauri Pole MD;  [place]: [facility]  Service: Gastroenterology;  Laterality: N/A;   ENDOSCOPIC MUCOSAL RESECTION N/A 01/09/2020   Procedure: ENDOSCOPIC MUCOSAL RESECTION;  Surgeon: MRush Landmark  Telford Nab., MD;  Location: [facility] ENDOSCOPY;  Service: Gastroenterology;  Laterality: N/A;   ENDOSCOPIC MUCOSAL RESECTION N/A 01/31/2021   Procedure: ENDOSCOPIC MUCOSAL RESECTION;  Surgeon: Rush Landmark Telford Nab., MD;  Location: [place];  Service: Gastroenterology;  Laterality: N/A;   ENTEROSCOPY N/A 09/26/2021   Procedure: ENTEROSCOPY;  Surgeon: Thornton Park, MD;  Location: [place];  Service: Gastroenterology;  Laterality: N/A;   ESOPHAGOGASTRODUODENOSCOPY     ESOPHAGOGASTRODUODENOSCOPY (EGD) WITH PROPOFOL N/A 01/09/2020   Procedure:  ESOPHAGOGASTRODUODENOSCOPY (EGD) WITH PROPOFOL;  Surgeon: Rush Landmark Telford Nab., MD;  Location: [place];  Service: Gastroenterology;  Laterality: N/A;   ESOPHAGOGASTRODUODENOSCOPY (EGD) WITH PROPOFOL N/A 01/13/2020   Procedure: ESOPHAGOGASTRODUODENOSCOPY (EGD) WITH PROPOFOL;  Surgeon: Irene Shipper, MD;  Location: [place];  Service: Gastroenterology;  Laterality: N/A;   ESOPHAGOGASTRODUODENOSCOPY (EGD) WITH PROPOFOL N/A 01/31/2021   Procedure: ESOPHAGOGASTRODUODENOSCOPY (EGD) WITH PROPOFOL;  Surgeon: Rush Landmark Telford Nab., MD;  Location: [place];  Service: Gastroenterology;  Laterality: N/A;   EUS N/A 01/09/2020   Procedure: UPPER ENDOSCOPIC ULTRASOUND (EUS) RADIAL;  Surgeon: Irving Copas., MD;  Location: [place];  Service: Gastroenterology;  Laterality: N/A;   GIVENS CAPSULE STUDY  09/29/2021   Procedure: GIVENS CAPSULE STUDY;  Surgeon: Thornton Park, MD;  Location: [place];  Service: Gastroenterology;;   HEMOSTASIS CLIP PLACEMENT  01/09/2020   Procedure: HEMOSTASIS CLIP PLACEMENT;  Surgeon: Irving Copas., MD;  Location: [place];  Service: Gastroenterology;;   HEMOSTASIS CLIP PLACEMENT  01/31/2021   Procedure: HEMOSTASIS CLIP PLACEMENT;  Surgeon: Irving Copas., MD;  Location: [place];  Service: Gastroenterology;;   HEMOSTASIS CONTROL  01/13/2020   Procedure: HEMOSTASIS CONTROL;  Surgeon: Irene Shipper, MD;  Location: [place];  Service: Gastroenterology;;  hemaspray   HOT HEMOSTASIS N/A 01/13/2020   Procedure: HOT HEMOSTASIS (ARGON PLASMA COAGULATION/BICAP);  Surgeon: Irene Shipper, MD;  Location: [place];  Service: Gastroenterology;  Laterality: N/A;   HOT HEMOSTASIS N/A 02/01/2021   Procedure: HOT HEMOSTASIS (ARGON PLASMA COAGULATION/BICAP);  Surgeon: Yetta Flock, MD;  Location: [facility] ENDOSCOPY;  Service: Gastroenterology;  Laterality: N/A;   HOT HEMOSTASIS N/A 10/01/2021   Procedure: HOT HEMOSTASIS (ARGON PLASMA  COAGULATION/BICAP);  Surgeon: Mauri Pole, MD;  Location: [place];  Service: Gastroenterology;  Laterality: N/A;   IR GENERIC HISTORICAL  07/09/2016   IR US GUIDE VASC ACCESS RIGHT 07/09/2016 Arne Cleveland, MD [facility]   IR GENERIC HISTORICAL  07/09/2016   IR FLUORO GUIDE CV LINE RIGHT 07/09/2016 Arne Cleveland, MD [facility]   KNEE ARTHROPLASTY Left    LAPAROSCOPIC SIGMOID COLECTOMY N/A 07/11/2016   Procedure: LAPAROSCOPIC SIGMOID COLECTOMY;  Surgeon: Clovis Riley, MD;  Location: [place];  Service: General;  Laterality: N/A;   LEFT ATRIAL APPENDAGE OCCLUSION N/A 02/06/2022   Procedure: LEFT ATRIAL APPENDAGE OCCLUSION;  Surgeon: Sherren Mocha, MD;  Location: [place] CV LAB;  Service: Cardiovascular;  Laterality: N/A;   MASS EXCISION Right 04/10/2020   Procedure: EXCISION SKIN NODULE RIGHT FOREARM;  Surgeon: Waynetta Sandy, MD;  Location: [place];  Service: Vascular;  Laterality: Right;   POLYPECTOMY  09/26/2021   Procedure: POLYPECTOMY;  Surgeon: Thornton Park, MD;  Location: [place];  Service: Gastroenterology;;   POLYPECTOMY  10/01/2021   Procedure: POLYPECTOMY;  Surgeon: Mauri Pole, MD;  Location: [employer];  Service: Gastroenterology;;   Clide Deutscher  01/09/2020   Procedure: Clide Deutscher;  Surgeon: Irving Copas., MD;  Location: [place];  Service: Gastroenterology;;   Clide Deutscher  01/13/2020   Procedure:  SCLEROTHERAPY;  Surgeon: Irene Shipper, MD;  Location: [place];  Service: Gastroenterology;;   Maryagnes Amos INJECTION  01/09/2020   Procedure: SUBMUCOSAL LIFTING INJECTION;  Surgeon: Irving Copas., MD;  Location: [place];  Service: Gastroenterology;;   TEE WITHOUT CARDIOVERSION N/A 02/06/2022   Procedure: TRANSESOPHAGEAL ECHOCARDIOGRAM (TEE);  Surgeon: Sherren Mocha, MD;  Location: [place] CV LAB;  Service: Cardiovascular;  Laterality: N/A;   TUBAL LIGATION      Family History  Problem  Relation Age of Onset   Heart failure Mother    Stroke Mother    Other Father    Colon cancer Neg Hx    Liver disease Neg Hx    Esophageal cancer Neg Hx    Stomach cancer Neg Hx    Inflammatory bowel disease Neg Hx    Rectal cancer Neg Hx    Pancreatic cancer Neg Hx     Social History   Socioeconomic History   Marital status: Divorced    Spouse name: Not on file   Number of children: 4   Years of education: 14   Highest education level: Associate degree: occupational, Hotel manager, or vocational program  Occupational History   Occupation: Retired  Tobacco Use   Smoking status: Never    Passive exposure: Never   Smokeless tobacco: Never  Vaping Use   Vaping Use: Never used  Substance and Sexual Activity   Alcohol use: No   Drug use: No   Sexual activity: Never  Other Topics Concern   Not on file  Social History Narrative   HH 1   Divorced   Outpatient dialysis Mon, Wed, Fri   4 children: 1 daughter locally is an Designer, multimedia and 3 sons in [place] Determinants of Health   Financial Resource Strain: High Risk (08/21/2021)   Overall Financial Resource Strain (CARDIA)    Difficulty of Paying Living Expenses: Very hard  Food Insecurity: Food Insecurity Present (08/21/2021)   Hunger Vital Sign    Worried About Running Out of Food in the Last Year: Often true    [place] in the Last Year: Often true  Transportation Needs: Unmet Transportation Needs (08/21/2021)   PRAPARE - Hydrologist (Medical): Yes    Lack of Transportation (Non-Medical): No  Physical Activity: Inactive (08/06/2021)   Exercise Vital Sign    Days of Exercise per Week: 0 days    Minutes of Exercise per Session: 0 min  Stress: Stress Concern Present (08/21/2021)   [place]    Feeling of Stress : Very much  Social Connections: Moderately Isolated (07/31/2020)   Social Connection and Isolation Panel  [NHANES]    Frequency of Communication with Friends and Family: More than three times a week    Frequency of Social Gatherings with Friends and Family: Never    Attends Religious Services: More than 4 times per year    Active Member of [employer] or Organizations: No    Attends Archivist Meetings: Never    Marital Status: Divorced  Human resources officer Violence: Not At Risk (07/31/2020)   Humiliation, Afraid, Rape, and Kick questionnaire    Fear of Current or Ex-Partner: No    Emotionally Abused: No    Physically Abused: No    Sexually Abused: No    Allergies  Allergen Reactions   Latex Rash   Penicillins Other (See Comments)    Yeast infection /  Childhood   Sulfa Antibiotics Rash   Tape Other (See Comments)    Plastic, silicone, and paper tape causes bruising and pulls off skin. Cloth tape works fine   Reglan [Metoclopramide] Itching    Itchiness, bug crawling sensation     Current Facility-Administered Medications  Medication Dose Route Frequency Provider Last Rate Last Admin   betamethasone acetate-betamethasone sodium phosphate (CELESTONE) injection 12 mg  12 mg Intramuscular Once Kirsteins, Luanna Salk, MD       lidocaine (XYLOCAINE) 1 % (with pres) injection 4 mL  4 mL Other Once Kirsteins, Luanna Salk, MD       lidocaine-EPINEPHrine (XYLOCAINE W/EPI) 1 %-1:100000 (with pres) injection 10 mL  10 mL Infiltration Once Isla Pence, MD       Current Outpatient Medications  Medication Sig Dispense Refill   aspirin EC 81 MG tablet Take 1 tablet (81 mg total) by mouth daily. Swallow whole. 30 tablet 12   atorvastatin (LIPITOR) 10 MG tablet Take 1 tablet (10 mg total) by mouth daily. 90 tablet 1   B Complex-C-Zn-Folic Acid (DIALYVITE 983 WITH ZINC) 0.8 MG TABS Take 1 tablet by mouth at bedtime.     carvedilol (COREG) 6.25 MG tablet Take 12.5 mg by mouth 2 (two) times daily. 180 tablet 3   cinacalcet (SENSIPAR) 30 MG tablet Take 30 mg by mouth every Monday, Wednesday, and Friday  with hemodialysis. Post dialysis     clopidogrel (PLAVIX) 75 MG tablet Take 1 tablet (75 mg total) by mouth daily. 90 tablet 1   diltiazem (CARDIZEM) 60 MG tablet Take 1 tablet (60 mg total) by mouth 3 (three) times daily as needed (Atrial fibrillation). 30 tablet 1   ethyl chloride spray Apply 1 application topically every Monday, Wednesday, and Friday with hemodialysis.  12   famotidine (PEPCID) 40 MG tablet Take 1 tablet (40 mg total) by mouth 2 (two) times daily. 180 tablet 1   lansoprazole (PREVACID) 30 MG capsule Take 1 capsule (30 mg total) by mouth 2 (two) times daily. 60 capsule 2   LINZESS 72 MCG capsule TAKE ONE CAPSULE BY MOUTH BEFORE BREAKFAST 30 capsule 2   Methoxy PEG-Epoetin Beta (MIRCERA IJ) Inject 1 Dose into the vein every Monday, Wednesday, and Friday with hemodialysis.     sevelamer carbonate (RENVELA) 800 MG tablet Take 1,600 mg by mouth in the morning and at bedtime. With meals take 800 mg with snack      PHYSICAL EXAM Vitals:   07/04/22 1929 07/04/22 1930 07/04/22 2208  BP:  (!) 160/64 (!) 107/53  Pulse:  (!) 59 65  Resp:   16  SpO2:  98% 100%  Weight: 98.2 kg    Height: '5\' 5"'$  (1.651 m)     Elderly woman in no acute distress Regular rate and rhythm Unlabored breathing Pressure bandage applied to right forearm arteriovenous fistula controlling bleeding    PERTINENT LABORATORY AND RADIOLOGIC DATA  Most recent CBC    Latest Ref Rng & Units 07/04/2022    7:58 PM 05/23/2022    2:00 PM 02/06/2022    6:32 AM  CBC  WBC 4.0 - 10.5 K/uL 5.8  3.9    Hemoglobin 12.0 - 15.0 g/dL 11.7  11.8  15.0   Hematocrit 36.0 - 46.0 % 39.8  36.6  44.0   Platelets 150 - 400 K/uL 45  159.0       Most recent CMP    Latest Ref Rng & Units 07/04/2022    7:58 PM 03/19/2022  4:21 PM 02/06/2022    6:32 AM  CMP  Glucose 70 - 99 mg/dL 96  103  89   BUN 8 - 23 mg/dL 10  23  33   Creatinine 0.44 - 1.00 mg/dL 5.93  5.86  9.30   Sodium 135 - 145 mmol/L 139  140  137   Potassium  3.5 - 5.1 mmol/L 4.5  4.2  4.2   Chloride 98 - 111 mmol/L 101  95  97   CO2 22 - 32 mmol/L 27  24    Calcium 8.9 - 10.3 mg/dL 8.7  9.2    Total Protein 6.5 - 8.1 g/dL 7.3     Total Bilirubin 0.3 - 1.2 mg/dL 0.4     Alkaline Phos 38 - 126 U/L 231     AST 15 - 41 U/L 23     ALT 0 - 44 U/L 15       Renal function Estimated Creatinine Clearance: 9.1 mL/min (A) (by C-G formula based on SCr of 5.93 mg/dL (H)).  No results found for: "HGBA1C"  LDL Cholesterol  Date Value Ref Range Status  10/06/2019 135 (H) 0 - 99 mg/dL Corrected    Comment:           Total Cholesterol/HDL:CHD Risk Coronary Heart Disease Risk Table                     Men   Women  1/2 Average Risk   3.4   3.3  Average Risk       5.0   4.4  2 X Average Risk   9.6   7.1  3 X Average Risk  23.4   11.0        Use the calculated Patient Ratio above and the CHD Risk Table to determine the patient's CHD Risk.        ATP III CLASSIFICATION (LDL):  <100     mg/dL   Optimal  100-129  mg/dL   Near or Above                    Optimal  130-159  mg/dL   Borderline  160-189  mg/dL   High  >190     mg/dL   Very High Performed at [facility] [address], [place], [place] [address] CORRECTED ON 03/18 AT 1308: PREVIOUSLY REPORTED AS NOT CALCULATED      Yevonne Aline. Stanford Breed, MD [facility] Phone Number: [phone] 07/04/2022 10:16 PM   Total time spent on preparing this encounter including chart review, data review, collecting history, examining the patient, coordinating care for this established patient, 40 minutes.  Portions of this report may have been transcribed using voice recognition software.  Every effort has been made to ensure accuracy; however, inadvertent computerized transcription errors may still be present.

## 2022-07-04 NOTE — Anesthesia Preprocedure Evaluation (Addendum)
Anesthesia Evaluation  Patient identified by MRN, date of birth, ID band Patient awake    Reviewed: Allergy & Precautions, NPO status , Patient's Chart, lab work & pertinent test results  History of Anesthesia Complications Negative for: history of anesthetic complications  Airway Mallampati: III  TM Distance: >3 FB Neck ROM: Full    Dental  (+) Teeth Intact, Dental Advisory Given   Pulmonary shortness of breath, asthma , sleep apnea    breath sounds clear to auscultation       Cardiovascular hypertension, Pt. on medications and Pt. on home beta blockers + dysrhythmias Atrial Fibrillation  Rhythm:Regular   1. Interventional TEE for LAA-O procedure.   2. Prior to the procedure, patent mixed windsock-broccolic LAA with  maximal diameter 2.0 cm and maximal depth 3.0 cm.   3. Left atrial size was moderately dilated. No left atrial/left atrial  appendage thrombus was detected.   4. A small PFO was present.   5. During procedure a mid-mid transeptal puncture performed without  complication.   6. Attempt of 24 mm Watchman FLX device with small < 43m leak and ~ 9%  compression, device recaptured.   7. Placement of a 27 mm Watchman FLX device. At last deployment no  inappropriate diplacement on tug and no peri-device leak. Average  compression 21 % (2.13 cm)   8. A small pericardial effusion is present. The pericardial effusion is  circumferential. Effusion unchanged before and after intervention.   9. Successfull placement of 27 mm Watchman FLX device.  10. Left ventricular ejection fraction, by estimation, is 55 to 60%. The  left ventricle has normal function.  11. Right ventricular systolic function is normal. The right ventricular  size is normal. There is severely elevated pulmonary artery systolic  pressure. The estimated right ventricular systolic pressure is 638.1mmHg  using coronary sinus flow as a  surrogate for IVC flow.   12. Right atrial size was moderately dilated.  13. The mitral valve is normal in structure. Mild mitral valve  regurgitation. No evidence of mitral stenosis.  14. Tricuspid valve regurgitation is moderate. Function in origin between  the septal and posterior leaflet. 3D VC 0.396 cm2.  15. Moderately dilated pulmonary artery.  16. The aortic valve is tricuspid. Aortic valve regurgitation is trivial.  No aortic stenosis is present.  17. There is mild (Grade II) plaque involving the aortic root and  ascending aorta.     Neuro/Psych  Headaches PSYCHIATRIC DISORDERS  Depression       GI/Hepatic Neg liver ROS, PUD,GERD  Medicated,,  Endo/Other  negative endocrine ROS    Renal/GU ESRF and DialysisRenal diseaseLab Results      Component                Value               Date                      CREATININE               5.93 (H)            07/04/2022            HD 07/04/22     Musculoskeletal  (+) Arthritis ,    Abdominal   Peds  Hematology  (+) Blood dyscrasia, anemia Lab Results      Component                Value  Date                      WBC                      5.8                 07/04/2022                HGB                      11.7 (L)            07/04/2022                HCT                      39.8                07/04/2022                MCV                      98.3                07/04/2022                PLT                      45 (L)              07/04/2022              Anesthesia Other Findings   Reproductive/Obstetrics                             Anesthesia Physical Anesthesia Plan  ASA: 3 and emergent  Anesthesia Plan: General   Post-op Pain Management: Ofirmev IV (intra-op)*   Induction: Intravenous, Rapid sequence and Cricoid pressure planned  PONV Risk Score and Plan: 3 and Ondansetron and Dexamethasone  Airway Management Planned: Oral ETT  Additional Equipment: None  Intra-op Plan:    Post-operative Plan: Extubation in OR  Informed Consent: I have reviewed the patients History and Physical, chart, labs and discussed the procedure including the risks, benefits and alternatives for the proposed anesthesia with the patient or authorized representative who has indicated his/her understanding and acceptance.     Dental advisory given  Plan Discussed with: CRNA  Anesthesia Plan Comments:        Anesthesia Quick Evaluation

## 2022-07-04 NOTE — ED Provider Notes (Signed)
[facility] Provider Note   CSN: [id number] Arrival date & time: 07/04/22  1924     History  Chief Complaint  Patient presents with   Vascular Access Problem    MARYAMA KURIAKOSE is a 76 y.o. female.  HPI  The patient is a 76 year old female with history of ESRD (M/W/F), A-fib on aspirin and Plavix, anemia, HTN, presenting from home by EMS for an AV fistula bleed.  The patient states that she completed her dialysis session earlier today.  She went to remove the bandage earlier this evening and noticed significant bleeding.  She attempted to apply direct pressure but was unable to control the bleeding prompting her to call EMS.  She stated that this happened on Wednesday after dialysis session as well however, at that time she was able to get bleeding under control.  EMS report the patient has had stable vital signs throughout transport.  She is endorsing nausea without vomiting.     Home Medications Prior to Admission medications   Medication Sig Start Date End Date Taking? Authorizing Provider  aspirin EC 81 MG tablet Take 1 tablet (81 mg total) by mouth daily. Swallow whole. 02/07/22   Tommie Raymond, NP  atorvastatin (LIPITOR) 10 MG tablet Take 1 tablet (10 mg total) by mouth daily. 05/30/22   Adrian Prows, MD  B Complex-C-Zn-Folic Acid (DIALYVITE 893 WITH ZINC) 0.8 MG TABS Take 1 tablet by mouth at bedtime. 07/19/20   [provider]  carvedilol (COREG) 6.25 MG tablet Take 12.5 mg by mouth 2 (two) times daily. 03/26/22   Adrian Prows, MD  cinacalcet (SENSIPAR) 30 MG tablet Take 30 mg by mouth every Monday, Wednesday, and Friday with hemodialysis. Post dialysis 12/04/21 12/03/22  [provider]  clopidogrel (PLAVIX) 75 MG tablet Take 1 tablet (75 mg total) by mouth daily. 03/19/22   Eileen Stanford, PA-C  diltiazem (CARDIZEM) 60 MG tablet Take 1 tablet (60 mg total) by mouth 3 (three) times daily as needed (Atrial fibrillation).  11/21/21   Adrian Prows, MD  ethyl chloride spray Apply 1 application topically every Monday, Wednesday, and Friday with hemodialysis. 12/25/17   [provider]  famotidine (PEPCID) 40 MG tablet Take 1 tablet (40 mg total) by mouth 2 (two) times daily. 06/09/22   Adrian Prows, MD  lansoprazole (PREVACID) 30 MG capsule Take 1 capsule (30 mg total) by mouth 2 (two) times daily. 06/02/22   Noralyn Pick, NP  LINZESS 72 MCG capsule TAKE ONE CAPSULE BY MOUTH BEFORE BREAKFAST 05/30/22   Noralyn Pick, NP  Methoxy PEG-Epoetin Beta (MIRCERA IJ) Inject 1 Dose into the vein every Monday, Wednesday, and Friday with hemodialysis. 08/21/21 08/20/22  [provider]  sevelamer carbonate (RENVELA) 800 MG tablet Take 1,600 mg by mouth in the morning and at bedtime. With meals take 800 mg with snack 08/26/21   [provider]      Allergies    Latex, Penicillins, Sulfa antibiotics, Tape, and Reglan [metoclopramide]    Review of Systems   Review of Systems  See HPI  Physical Exam Updated Vital Signs BP (!) 151/62   Pulse 69   Resp 20   Ht '5\' 5"'$  (1.651 m)   Wt 98.2 kg   SpO2 100%   BMI 36.03 kg/m  Physical Exam Vitals and nursing note reviewed.  Constitutional:      General: She is not in acute distress.    Appearance: She is well-developed.  Comments: Patient alert, oriented   HENT:     Head: Normocephalic and atraumatic.     Nose: Nose normal.     Mouth/Throat:     Mouth: Mucous membranes are moist.  Eyes:     Conjunctiva/sclera: Conjunctivae normal.  Cardiovascular:     Rate and Rhythm: Normal rate and regular rhythm.     Heart sounds: No murmur heard. Pulmonary:     Effort: Pulmonary effort is normal. No respiratory distress.     Breath sounds: Normal breath sounds.  Abdominal:     Palpations: Abdomen is soft.     Tenderness: There is no abdominal tenderness.  Musculoskeletal:        General: No swelling.     Cervical back: Neck supple.   Skin:    General: Skin is warm and dry.     Capillary Refill: Capillary refill takes 2 to 3 seconds.     Comments: Pressure dressing in place over right forearm AV fistula site.  Neurological:     General: No focal deficit present.     Mental Status: She is alert and oriented to person, place, and time. Mental status is at baseline.     Cranial Nerves: No cranial nerve deficit.  Psychiatric:        Mood and Affect: Mood normal.     ED Results / Procedures / Treatments   Labs (all labs ordered are listed, but only abnormal results are displayed) Labs Reviewed  CBC WITH DIFFERENTIAL/PLATELET - Abnormal; Notable for the following components:      Result Value   Hemoglobin 11.7 (*)    MCHC 29.4 (*)    RDW 17.9 (*)    Platelets 45 (*)    All other components within normal limits  COMPREHENSIVE METABOLIC PANEL - Abnormal; Notable for the following components:   Creatinine, Ser 5.93 (*)    Calcium 8.7 (*)    Albumin 3.4 (*)    Alkaline Phosphatase 231 (*)    GFR, Estimated 7 (*)    All other components within normal limits  PROTIME-INR - Abnormal; Notable for the following components:   Prothrombin Time 15.3 (*)    All other components within normal limits  APTT    EKG None  Radiology No results found.  Procedures Procedures    Medications Ordered in ED Medications  lidocaine-EPINEPHrine (XYLOCAINE W/EPI) 1 %-1:100000 (with pres) injection 10 mL ( Infiltration MAR Hold 07/04/22 2325)  HYDROmorphone (DILAUDID) injection 0.5 mg ( Intravenous MAR Hold 07/04/22 2325)  0.9 % irrigation (POUR BTL) (1,000 mLs Irrigation Given 07/04/22 2308)  heparin 6000 units / NS 500 mL irrigation (1 Application Irrigation Given 07/04/22 2308)    ED Course/ Medical Decision Making/ A&P                           Medical Decision Making  The patient is a 76 year old female with history of ESRD (M/W/F), A-fib on aspirin and Plavix, anemia, HTN, presenting from home by EMS for an AV  fistula bleed.  On initial exam, the patient was noted to be covered in blood from head to toe.  EMS was holding direct pressure on arrival.  An attempt to remove the dressing resulted in arterial spray.  A pressure dressing was placed.  The patient was noted to be hemodynamically stable, alert and oriented x 4 with a GCS of 15.  The patient's diagnostic workup which I independently reviewed and interpreted, included a CBC  with white blood cell count of 5.8 and hemoglobin of 11.7; CMP with alkaline phos elevated to 31 but no significant metabolic abnormality; PT/INR with PT elevated to 15.3, INR 1.2, and APTT 33.  The pressure dressing was left in place for over an hour before an attempt was made to remove it.  The dressing was taken down and the patient was noted to have significant arterial bleeding.  A quick clot bandage was placed and direct pressure was applied for over 10 minutes.  The plan was for suture closure if hemostasis was achieved however, with the hemostatic gauze was removed, the patient was noted to have a ulcer overlying the artery approximately 0.2 cm in diameter with significant active bleeding.  A pressure dressing was reapplied and vascular surgery was consulted.  The patient was taken to the operating room for AV fistula repair with vascular surgery.  Amount and/or Complexity of Data Reviewed Independent Historian: EMS External Data Reviewed: notes. Labs: ordered. Decision-making details documented in ED Course.  Risk Prescription drug management. Decision regarding hospitalization.   Patient's presentation is most consistent with acute presentation with potential threat to life or bodily function.         Final Clinical Impression(s) / ED Diagnoses Final diagnoses:  Bleeding from dialysis shunt, initial encounter [facility])    Rx / [place] Orders ED Discharge Orders     None         Dani Gobble, MD 07/04/22 6160    Isla Pence, MD 07/05/22 1525

## 2022-07-05 ENCOUNTER — Encounter (HOSPITAL_COMMUNITY): Payer: Self-pay | Admitting: Vascular Surgery

## 2022-07-05 DIAGNOSIS — T82838A Hemorrhage of vascular prosthetic devices, implants and grafts, initial encounter: Secondary | ICD-10-CM | POA: Diagnosis not present

## 2022-07-05 MED ORDER — GUAIFENESIN-DM 100-10 MG/5ML PO SYRP: 15.0000 mL | ORAL_SOLUTION | ORAL | Status: DC | PRN

## 2022-07-05 MED ORDER — OXYCODONE HCL 5 MG PO TABS: 5.0000 mg | ORAL_TABLET | ORAL | Status: DC | PRN

## 2022-07-05 MED ORDER — LABETALOL HCL 5 MG/ML IV SOLN: 10.0000 mg | INTRAVENOUS | Status: DC | PRN

## 2022-07-05 MED ORDER — POTASSIUM CHLORIDE CRYS ER 20 MEQ PO TBCR: 20.0000 meq | EXTENDED_RELEASE_TABLET | Freq: Once | ORAL | Status: DC

## 2022-07-05 MED ORDER — HYDRALAZINE HCL 20 MG/ML IJ SOLN: 5.0000 mg | INTRAMUSCULAR | Status: DC | PRN

## 2022-07-05 MED ORDER — ALUM & MAG HYDROXIDE-SIMETH 200-200-20 MG/5ML PO SUSP: 15.0000 mL | ORAL | Status: DC | PRN

## 2022-07-05 MED ORDER — ONDANSETRON HCL 4 MG/2ML IJ SOLN: 4.0000 mg | Freq: Four times a day (QID) | INTRAMUSCULAR | Status: DC | PRN

## 2022-07-05 MED ORDER — METOPROLOL TARTRATE 5 MG/5ML IV SOLN: 2.0000 mg | INTRAVENOUS | Status: DC | PRN

## 2022-07-05 MED ORDER — PHENOL 1.4 % MT LIQD: 1.0000 | OROMUCOSAL | Status: DC | PRN

## 2022-07-05 MED ORDER — ACETAMINOPHEN 325 MG PO TABS
650.0000 mg | ORAL_TABLET | Freq: Four times a day (QID) | ORAL | Status: DC
  Filled 2022-07-05: qty 2

## 2022-07-05 MED ORDER — HEPARIN SODIUM (PORCINE) 5000 UNIT/ML IJ SOLN
5000.0000 [IU] | Freq: Three times a day (TID) | INTRAMUSCULAR | Status: DC
  Administered 2022-07-05: 5000 [IU] via SUBCUTANEOUS
  Filled 2022-07-05: qty 1

## 2022-07-05 MED ORDER — PANTOPRAZOLE SODIUM 40 MG PO TBEC
40.0000 mg | DELAYED_RELEASE_TABLET | Freq: Every day | ORAL | Status: DC
  Administered 2022-07-05: 40 mg via ORAL
  Filled 2022-07-05: qty 1

## 2022-07-05 NOTE — Discharge Instructions (Signed)
   [facility]  Discharge Instructions  AV Fistula Revision  Please refer to the following instructions for your post-procedure care. Your surgeon or physician assistant will discuss any changes with you.  Activity  You may drive the day following your surgery, if you are comfortable and no longer taking prescription pain medication. Resume full activity as the soreness in your incision resolves.  Bathing/Showering  You may shower after you go home. Keep your incision dry for 48 hours. Do not soak in a bathtub, hot tub, or swim until the incision heals completely. You may not shower if you have a hemodialysis catheter.  Incision Care  Clean your incision with mild soap and water after 48 hours. Pat the area dry with a clean towel. You do not need a bandage unless otherwise instructed. Do not apply any ointments or creams to your incision.   Diet  Resume your normal diet. There are not special food restrictions following this procedure. In order to heal from your surgery, it is CRITICAL to get adequate nutrition. Your body requires vitamins, minerals, and protein. Vegetables are the best source of vitamins and minerals. Vegetables also provide the perfect balance of protein. Processed food has little nutritional value, so try to avoid this.  Medications  Resume taking all of your medications. If your incision is causing pain, you may take over-the counter pain relievers such as acetaminophen (Tylenol). If you were prescribed a stronger pain medication, please be aware these medications can cause nausea and constipation. Prevent nausea by taking the medication with a snack or meal. Avoid constipation by drinking plenty of fluids and eating foods with high amount of fiber, such as fruits, vegetables, and grains.  Do not take Tylenol if you are taking prescription pain medications.  Follow up Your surgeon may want to see you in the office following your  access surgery. If so, this will be arranged at the time of your surgery.  Please call us immediately for any of the following conditions:  Increased pain, redness, drainage (pus) from your incision site Fever of 101 degrees or higher Severe or worsening pain at your incision site Hand pain or numbness.  Reduce your risk of vascular disease:  Stop smoking. If you would like help, call [facility] at 1-800-QUIT-NOW 757 524 9562) or [place] at [place] your cholesterol Maintain a desired weight Control your diabetes Keep your blood pressure down    07/05/2022 Jane [name] [id number] [birth year]  Surgeon(s): Cherre Robins, MD  Procedure(s): REVISON OF RIGHT FOREARM ARTERIOVENOUS FISTULA   May stick graft immediately   x May stick fistula on designated area only: above or below staple line       If you have any questions, please call the office at [phone].

## 2022-07-05 NOTE — Care Management Obs Status (Signed)
[facility] NOTIFICATION   Patient Details  Name: RAJVI ARMENTOR MRN: [id number] Date of Birth: 04-26-1946   Medicare Observation Status Notification Given:  Yes    Konrad Penta, RN 07/05/2022, 10:28 AM

## 2022-07-05 NOTE — TOC Transition Note (Signed)
Transition of Care [facility]) - CM/SW Discharge Note   Patient Details  Name: Jane [name] MRN: [id number] Date of Birth: [birth year]  Transition of Care [facility]) CM/SW Contact:  Konrad Penta, RN Phone Number: [phone] 07/05/2022, 10:11 AM   Clinical Narrative:   Spoke with Ms. Gelinas prior to discharge. She denies having any TOC needs. Lives alone independently. Has supportive daughter who lives nearby. Daughter will transport home today.   Please consult TOC if needs arise.    Final next level of care: Home/Self Care Barriers to Discharge: No Barriers Identified   Patient Goals and CMS Choice Patient states their goals for this hospitalization and ongoing recovery are:: to return home      Discharge Placement                       Discharge Plan and Services                                     Social Determinants of Health (SDOH) Interventions     Readmission Risk Interventions     No data to display

## 2022-07-05 NOTE — Care Management CC44 (Signed)
Condition Code 44 Documentation Completed  Patient Details  Name: Jane [name] MRN: [id number] Date of Birth: [birth year]   Condition Code 44 given:  Yes Patient signature on Condition Code 44 notice:  Yes Documentation of 2 MD's agreement:  Yes Code 44 added to claim:  Yes    Konrad Penta, RN 07/05/2022, 10:28 AM

## 2022-07-05 NOTE — Anesthesia Procedure Notes (Addendum)
Procedure Name: Intubation Date/Time: 07/04/2022 11:24 PM  Performed by: Iasia Forcier T, CRNAPre-anesthesia Checklist: Patient identified, Emergency Drugs available, Suction available and Patient being monitored Patient Re-evaluated:Patient Re-evaluated prior to induction Oxygen Delivery Method: Circle system utilized Preoxygenation: Pre-oxygenation with 100% oxygen Induction Type: IV induction, Rapid sequence and Cricoid Pressure applied Ventilation: Mask ventilation without difficulty Laryngoscope Size: Miller and 2 Grade View: Grade I Tube type: Oral Tube size: 7.0 mm Number of attempts: 1 Airway Equipment and Method: Stylet and Oral airway Placement Confirmation: ETT inserted through vocal cords under direct vision, positive ETCO2 and breath sounds checked- equal and bilateral Secured at: 21 cm Tube secured with: Tape Dental Injury: Teeth and Oropharynx as per pre-operative assessment

## 2022-07-05 NOTE — Progress Notes (Signed)
Patient arrived at the unit,CCMD notified,vitals checked,CHG bath given,patient oriented to the unit,no complains of the pain.Care continues.

## 2022-07-05 NOTE — Progress Notes (Signed)
Pacu RN Report to floor given  Gave report to  RN. Room: 4E24    Discussed surgery, meds given in OR and Pacu, VS, IV fluids given, EBL, urine output, pain and other pertinent information. Also discussed if pt had any family or friends here or belongings with them.   Discussed dressing of 4x4, staples, tape. No bleeding or hematoma noted. Palpable R radial pulse. +CSM to hand, cap refill < 3 seconds. No pain per pt. Moving arm.   Pt normally has HD on M/W/F.   Pt's daughter has gone home for the night, she has all of her belongings.   Pt exits my care.

## 2022-07-05 NOTE — Progress Notes (Addendum)
  Progress Note    07/05/2022 9:37 AM 1 Day Post-Op  Subjective:  feeling good, denies pain in right arm   Vitals:   07/05/22 0521 07/05/22 0748  BP: (!) 112/48 113/61  Pulse:  70  Resp:  17  Temp: 98.6 F (37 C) 98.1 F (36.7 C)  SpO2:  98%    Physical Exam: Lungs:  nonlabored Incisions: Right radial incision intact with staples Extremities: Palpable right radial pulse.  Palpable right AVF thrill.  Intact motor and sensation of the right upper extremity   CBC    Component Value Date/Time   WBC 5.8 07/04/2022 1958   RBC 4.05 07/04/2022 1958   HGB 11.7 (L) 07/04/2022 1958   HGB 12.0 02/03/2022 1236   HCT 39.8 07/04/2022 1958   HCT 37.0 02/03/2022 1236   PLT 45 (L) 07/04/2022 1958   PLT 269 02/03/2022 1236   MCV 98.3 07/04/2022 1958   MCV 87 02/03/2022 1236   MCH 28.9 07/04/2022 1958   MCHC 29.4 (L) 07/04/2022 1958   RDW 17.9 (H) 07/04/2022 1958   RDW 17.8 (H) 02/03/2022 1236   LYMPHSABS 0.7 07/04/2022 1958   MONOABS 0.6 07/04/2022 1958   EOSABS 0.4 07/04/2022 1958   BASOSABS 0.0 07/04/2022 1958    BMET    Component Value Date/Time   NA 139 07/04/2022 1958   NA 140 03/19/2022 1621   K 4.5 07/04/2022 1958   CL 101 07/04/2022 1958   CO2 27 07/04/2022 1958   GLUCOSE 96 07/04/2022 1958   BUN 10 07/04/2022 1958   BUN 23 03/19/2022 1621   CREATININE 5.93 (H) 07/04/2022 1958   CALCIUM 8.7 (L) 07/04/2022 1958   GFRNONAA 7 (L) 07/04/2022 1958   GFRAA 4 (L) 01/18/2020 0714    INR    Component Value Date/Time   INR 1.2 07/04/2022 2025     Intake/Output Summary (Last 24 hours) at 07/05/2022 9675 Last data filed at 07/05/2022 0130 Gross per 24 hour  Intake 250 ml  Output 5 ml  Net 245 ml      Assessment/Plan:  76 y.o. female is 1 day post op, s/p: Revision of right arm AV fistula due to bleeding ulceration   -Right arm AV fistula with palpable thrill.  Good area of cannulation both above and below staple line -Palpable right radial pulse.   Intact motor and sensation of right upper extremity -Right upper extremity incision intact with staples -Pain well-controlled, not requiring any narcotics -Will discharge home today.  Will arrange follow-up with our office in 2-3 weeks for incision check. Dialysis can continue to use the AV fistula above and below the staple line until fully healed   Vicente Serene, PA-C [facility] [phone] 07/05/2022 9:37 AM   VASCULAR STAFF ADDENDUM: I have independently interviewed and examined the patient. I agree with the above.  Call if dialysis center cannot cannulate fistula. Would need TDC if they are uncomfortable. Otherwise follow up in clinic.  Yevonne Aline. Stanford Breed, MD [facility] Phone Number: [phone] 07/05/2022 11:24 AM

## 2022-07-05 NOTE — Transfer of Care (Signed)
Immediate Anesthesia Transfer of Care Note  Patient: Jane [name]  Procedure(s) Performed: REVISON OF RIGHT FOREARM ARTERIOVENOUS FISTULA (Right: Arm Lower)  Patient Location: PACU  Anesthesia Type:General  Level of Consciousness: drowsy  Airway & Oxygen Therapy: Patient Spontanous Breathing and Patient connected to nasal cannula oxygen  Post-op Assessment: Report given to RN, Post -op Vital signs reviewed and stable, and Patient moving all extremities  Post vital signs: Reviewed and stable  Last Vitals:  Vitals Value Taken Time  BP 132/49 07/05/22 0008  Temp    Pulse 75 07/05/22 0012  Resp 16 07/05/22 0012  SpO2 100 % 07/05/22 0012  Vitals shown include unvalidated device data.  Last Pain:  Vitals:   07/04/22 1932  PainSc: 0-No pain         Complications: No notable events documented.

## 2022-07-06 NOTE — Anesthesia Postprocedure Evaluation (Signed)
Anesthesia Post Note  Patient: Jane [name]  Procedure(s) Performed: REVISON OF RIGHT FOREARM ARTERIOVENOUS FISTULA (Right: Arm Lower)     Patient location during evaluation: PACU Anesthesia Type: General Level of consciousness: awake and alert Pain management: pain level controlled Vital Signs Assessment: post-procedure vital signs reviewed and stable Respiratory status: spontaneous breathing, nonlabored ventilation, respiratory function stable and patient connected to nasal cannula oxygen Cardiovascular status: blood pressure returned to baseline and stable Postop Assessment: no apparent nausea or vomiting Anesthetic complications: no  No notable events documented.  Last Vitals:  Vitals:   07/05/22 0521 07/05/22 0748  BP: (!) 112/48 113/61  Pulse:  70  Resp:  17  Temp: 37 C 36.7 C  SpO2:  98%    Last Pain:  Vitals:   07/05/22 0748  TempSrc: Oral  PainSc: 0-No pain                 Harvest Stanco

## 2022-07-07 ENCOUNTER — Telehealth: Payer: Self-pay | Admitting: Pharmacist

## 2022-07-07 DIAGNOSIS — Z992 Dependence on renal dialysis: Secondary | ICD-10-CM | POA: Diagnosis not present

## 2022-07-07 DIAGNOSIS — D631 Anemia in chronic kidney disease: Secondary | ICD-10-CM | POA: Diagnosis not present

## 2022-07-07 DIAGNOSIS — N186 End stage renal disease: Secondary | ICD-10-CM | POA: Diagnosis not present

## 2022-07-07 DIAGNOSIS — N2581 Secondary hyperparathyroidism of renal origin: Secondary | ICD-10-CM | POA: Diagnosis not present

## 2022-07-07 NOTE — Chronic Care Management (AMB) (Signed)
    Chronic Care Management Pharmacy Assistant   Name: DHAMAR GREGORY  MRN: [id number] DOB: [birth year]  07/08/2022 APPOINTMENT REMINDER  Tamberly M Diem was reminded to have all medications, supplements and any blood glucose and blood pressure readings available for review with Jeni Salles, Pharm. D, at her telephone visit on 07/08/2022 at 4:00.  Care Gaps: AWV - scheduled for 08/12/2022 Last BP - 174/73 on 07/02/2022 Covid - overdue AWV - due soon Shingrix - postponed  Star Rating Drug: Atorvastatin 10 mg - last filled 06/02/2022 90 DS at [facility]  Any gaps in medications fill history? No  Gennie Alma [place]  Catering manager [phone]

## 2022-07-08 ENCOUNTER — Ambulatory Visit (INDEPENDENT_AMBULATORY_CARE_PROVIDER_SITE_OTHER): Payer: Medicare Other | Admitting: Pharmacist

## 2022-07-08 DIAGNOSIS — I129 Hypertensive chronic kidney disease with stage 1 through stage 4 chronic kidney disease, or unspecified chronic kidney disease: Secondary | ICD-10-CM

## 2022-07-08 DIAGNOSIS — I480 Paroxysmal atrial fibrillation: Secondary | ICD-10-CM

## 2022-07-08 NOTE — Progress Notes (Signed)
Chronic Care Management Pharmacy Note  07/09/2022 Name:  Jane [name] MRN:  [id number] DOB:  [birth year]  Summary: BP is a lot lower lately Pt is following up with cardiology next week  Recommendations/Changes made from today's visit: -Recommended discussing with cardiology about dose reduction of carvedilol if BP remains low -Recommended continued daily BP monitoring at home  Plan: BP assessment in 2 months Follow up in 6 months  Subjective: Jane [name] is an 76 y.o. year old female who is a primary patient of [name], [name], MD.  The CCM team was consulted for assistance with disease management and care coordination needs.    Engaged with patient by telephone for follow up visit in response to provider referral for pharmacy case management and/or care coordination services.   Consent to Services:  The patient was given information about Chronic Care Management services, agreed to services, and gave verbal consent prior to initiation of services.  Please see initial visit note for detailed documentation.   Patient Care Team: [name], [name], MD as PCP - General (Family Medicine) Sidonie Dickens, MD as Referring Physician (Psychiatry) [facility], [place], [place], [place] as Pharmacist (Pharmacist) Rebekah Chesterfield, LCSW as Social Worker (Licensed Clinical Social Worker)  Recent office visits: 04/15/2022 [name] MD - Patient was seen for Injury of left knee, subsequent encounter and additional concerns. No medication changes.    04/04/2022 [name] MD - Patient was seen for Hypertension with renal disease and an additional concern. No medication changes.    04/01/2022 [name] MD - Patient was seen for Hypertension with renal disease and an additional concern. Started Isosorbide 30 mg daily. Discontinued Midodrine.    03/13/2022 [name] MD - Patient was seen for Hypertension with renal disease and an additional concern.   Recent consult  visits: 07/02/22 Servando Snare, MD (vascular surgery): Patient presented for fistula. Plan for surgery.   06/26/22 Frankey Shown, MD (ortho): Patient presented for right knee pain. Plan for X-ray.    05/23/2022 Colleen Berniece Pap NP (GI) - Patient was seen for ESRD on dialysis and an additional issue. No medication changes.    04/29/2022 Frankey Shown MD (ortho) - Patient was seen for  Right knee pain, unspecified chronicity. No medication changes.   04/22/2022 [name] MD (PT rehab) - Patient was seen for Chronic pain of right knee and additional concern. No medication changes.    03/26/2022 Adrian Prows MD (cardiology) - Patient was seen for Longstanding persistent atrial fibrillation and additional concerns. Changed Carvedilol to 6.25 mg 2 times daily, Take two tablets if SBP >150 mm.   03/19/2022 Angelena Form PA-C (cardiology) - Patient was seen for Presence of Watchman left atrial appendage closure device and additional concerns. Started Clopidogrel 75 mg daily. Discontinued Diazepam and Warfarin.    03/06/2022 Adrian Prows MD (cardiology) - Patient was seen for Paroxysmal atrial fibrillation. No medication changes.    02/28/2022 [name] MD (PT rehab) - Patient was seen for Spondylosis without myelopathy or radiculopathy, lumbar region. Changed Carvedilol to 6.25 mg twice daily.    02/18/2022 Adrian Prows MD (cardiology) - Patient was seen for Hypertension with renal disease. No medication changes.  Hospital visits: Admitted to [facility] on 07/04/2022 due to status post surgery. Discharge date was 07/05/2022.   New?Medications Started at [facility] Discharge:?? None Medication Changes at Hospital Discharge: No medication changes Medications Discontinued at Hospital Discharge: None Medications that remain the same after [facility]  Discharge:??  -All other medications will remain the same.     Objective:  Lab Results  Component Value Date   CREATININE 5.93 (H)  07/04/2022   BUN 10 07/04/2022   GFR 2.96 (LL) 09/19/2021   GFRNONAA 7 (L) 07/04/2022   GFRAA 4 (L) 01/18/2020   NA 139 07/04/2022   K 4.5 07/04/2022   CALCIUM 8.7 (L) 07/04/2022   CO2 27 07/04/2022   GLUCOSE 96 07/04/2022    Lab Results  Component Value Date/Time   GFR 2.96 (LL) 09/19/2021 02:30 PM   GFR 4.82 (LL) 11/20/2020 10:25 AM    Last diabetic Eye exam: No results found for: "HMDIABEYEEXA"  Last diabetic Foot exam: No results found for: "HMDIABFOOTEX"   Lab Results  Component Value Date   CHOL 198 10/06/2019   HDL 53 10/06/2019   LDLCALC 135 (H) 10/06/2019   TRIG 51 10/06/2019   CHOLHDL 3.7 10/06/2019       Latest Ref Rng & Units 07/04/2022    7:58 PM 10/02/2021   11:13 AM 09/27/2021   12:11 PM  Hepatic Function  Total Protein 6.5 - 8.1 g/dL 7.3     Albumin 3.5 - 5.0 g/dL 3.4  3.0  3.3   AST 15 - 41 U/L 23     ALT 0 - 44 U/L 15     Alk Phosphatase 38 - 126 U/L 231     Total Bilirubin 0.3 - 1.2 mg/dL 0.4       Lab Results  Component Value Date/Time   TSH 1.36 11/20/2020 10:25 AM   TSH 0.351 01/13/2020 07:33 AM   TSH 1.129 10/06/2019 03:10 PM   TSH 0.94 09/22/2018 10:32 AM       Latest Ref Rng & Units 07/04/2022    7:58 PM 05/23/2022    2:00 PM 02/06/2022    6:32 AM  CBC  WBC 4.0 - 10.5 K/uL 5.8  3.9    Hemoglobin 12.0 - 15.0 g/dL 11.7  11.8  15.0   Hematocrit 36.0 - 46.0 % 39.8  36.6  44.0   Platelets 150 - 400 K/uL 45  159.0      No results found for: "VD25OH"  Clinical ASCVD: Yes  The 10-year ASCVD risk score (Arnett DK, et al., 2019) is: 14.8%   Values used to calculate the score:     Age: 39 years     Sex: Female     Is Non-Hispanic African American: Yes     Diabetic: No     Tobacco smoker: No     Systolic Blood Pressure: 101 mmHg     Is BP treated: Yes     HDL Cholesterol: 53 mg/dL     Total Cholesterol: 198 mg/dL       04/22/2022    2:01 PM 04/01/2022    8:15 AM 03/13/2022   10:54 AM  Depression screen PHQ 2/9  Decreased  Interest 0 3 0  Down, Depressed, Hopeless 0 0 0  PHQ - 2 Score 0 3 0  Altered sleeping  3 0  Tired, decreased energy  3 0  Change in appetite  0 0  Feeling bad or failure about yourself   0 0  Trouble concentrating  0 0  Moving slowly or fidgety/restless  0 0  Suicidal thoughts  0 0  PHQ-9 Score  9 0  Difficult doing work/chores  Not difficult at all Not difficult at all     CHA2DS2/VAS Stroke Risk Points  Current as of  4 days ago     4 >= 2 Points: High Risk  1 - 1.99 Points: Medium Risk  0 Points: Low Risk    No Change      Details    This score determines the patient's risk of having a stroke if the  patient has atrial fibrillation.       Points Metrics  0 Has Congestive Heart Failure:  No    Current as of 4 days ago  0 Has Vascular Disease:  No    Current as of 4 days ago  1 Has Hypertension:  Yes    Current as of 4 days ago  2 Age:  57    Current as of 4 days ago  0 Has Diabetes:  No    Current as of 4 days ago  0 Had Stroke:  No  Had TIA:  No  Had Thromboembolism:  No    Current as of 4 days ago  1 Female:  Yes    Current as of 4 days ago       Social History   Tobacco Use  Smoking Status Never   Passive exposure: Never  Smokeless Tobacco Never   BP Readings from Last 3 Encounters:  07/05/22 113/61  07/02/22 (!) 174/73  05/23/22 (!) 140/78   Pulse Readings from Last 3 Encounters:  07/05/22 70  07/02/22 (!) 58  05/23/22 70   Wt Readings from Last 3 Encounters:  07/04/22 216 lb 7.9 oz (98.2 kg)  07/02/22 196 lb (88.9 kg)  05/23/22 197 lb 12.8 oz (89.7 kg)   BMI Readings from Last 3 Encounters:  07/04/22 36.03 kg/m  07/02/22 32.62 kg/m  05/23/22 32.92 kg/m    Assessment/Interventions: Review of patient past medical history, allergies, medications, health status, including review of consultants reports, laboratory and other test data, was performed as part of comprehensive evaluation and provision of chronic care management services.    SDOH:  (Social Determinants of Health) assessments and interventions performed: Yes  SDOH Interventions    Flowsheet Row Telephone from 08/21/2021 in [place] Management from 08/13/2021 in [place] at [place] from 10/23/2020 in [place] at [place] from 07/28/2019 in [place] at [employer] from 09/22/2018 in [place] at [place] Interventions [id number] Referral -- -- -- --  Transportation Interventions [id number] Referral  [Has had issues using public transportation] Other (Comment)  [getting her set up with delivery pharmacy] -- -- --  Depression Interventions/Treatment  -- -- Currently on Treatment Counseling  [PCP appointment scheduled] Medication  Financial Strain Interventions --  [Spray for her port access is 40.00 over the counter Ethyl Choride Spray mist  monthly . OTC vitamin and stomache medicine is another 25.00 out of pocket and also need to join aarp for 16.00 yearly fee if she will qualify to get 100 % paid for oxygen] Intervention Not Indicated -- -- --       SDOH Screenings   Food Insecurity: Food Insecurity Present (08/21/2021)  Housing: Low Risk  (07/31/2020)  Transportation Needs: Unmet Transportation Needs (08/21/2021)  Depression (PHQ2-9): Low Risk  (04/22/2022)  Recent Concern: Depression (PHQ2-9) - Medium Risk (04/01/2022)  Financial Resource Strain: High Risk (07/09/2022)  Physical Activity: Inactive (08/06/2021)  Social Connections: Moderately Isolated (07/31/2020)  Stress: Stress Concern Present (08/21/2021)  Tobacco Use: Low Risk  (07/05/2022)    [place]  Allergies  Allergen Reactions   Latex Rash   Penicillins Other (See Comments)    Yeast infection / Childhood   Sulfa Antibiotics Rash   Tape Other (See Comments)    Plastic, silicone, and paper tape causes bruising and pulls off  skin. Cloth tape works fine   Reglan [Metoclopramide] Itching    Itchiness, bug crawling sensation     Medications Reviewed Today     Reviewed by Merceda Elks, CPhT (Pharmacy Technician) on 07/07/22 at 1145  Med List Status: F/U phone call - Tues   Medication Order Taking? Sig Documenting Provider Last Dose Status Informant  aspirin EC 81 MG tablet [id number]  Take 1 tablet (81 mg total) by mouth daily. Swallow whole. Kathyrn Drown D, NP  Active   atorvastatin (LIPITOR) 10 MG tablet [id number]  Take 1 tablet (10 mg total) by mouth daily. Adrian Prows, MD  Active   B Complex-C-Zn-Folic Acid (DIALYVITE 272 WITH ZINC) 0.8 MG TABS [id number]  Take 1 tablet by mouth at bedtime. [provider]  Active Self           Med Note Lenor Derrick Oct 15, 2020  1:55 PM)    betamethasone acetate-betamethasone sodium phosphate (CELESTONE) injection 12 mg [id number]   Charlett Blake, MD  Active   carvedilol (COREG) 6.25 MG tablet [id number]  Take 12.5 mg by mouth 2 (two) times daily. Adrian Prows, MD  Active   cinacalcet [facility]) 30 MG tablet [id number]  Take 30 mg by mouth every Monday, Wednesday, and Friday with hemodialysis. Post dialysis [provider]  Active Self  clopidogrel (PLAVIX) 75 MG tablet [id number]  Take 1 tablet (75 mg total) by mouth daily. Eileen Stanford, PA-C  Active   diltiazem (CARDIZEM) 60 MG tablet [id number]  Take 1 tablet (60 mg total) by mouth 3 (three) times daily as needed (Atrial fibrillation). Adrian Prows, MD  Active Self  ethyl chloride spray [id number]  Apply 1 application topically every Monday, Wednesday, and Friday with hemodialysis. [provider]  Active Self  famotidine (PEPCID) 40 MG tablet [id number]  Take 1 tablet (40 mg total) by mouth 2 (two) times daily. Adrian Prows, MD  Active   lansoprazole (PREVACID) 30 MG capsule [id number]  Take 1 capsule (30 mg total) by mouth 2 (two) times daily. Noralyn Pick, NP  Active    lidocaine (XYLOCAINE) 1 % (with pres) injection 4 mL [id number]   Charlett Blake, MD  Active   LINZESS 72 MCG capsule [id number]  TAKE ONE CAPSULE BY MOUTH BEFORE BREAKFAST Noralyn Pick, NP  Active   Methoxy PEG-Epoetin Beta [facility] IJ) [id number]  Inject 1 Dose into the vein every Monday, Wednesday, and Friday with hemodialysis. [provider]  Active Self           Med Note Gentry Roch   Wed Jan 29, 2022 11:14 AM)    sevelamer carbonate (RENVELA) 800 MG tablet [id number]  Take 1,600 mg by mouth in the morning and at bedtime. With meals take 800 mg with snack [provider]  Active Self  Med List Note Dessie Coma, CPhT 06/25/16 2051): Dialysis days: Mon/Wed/Fri at: [facility], [place], [place] [address]  [phone]            Patient Active Problem List   Diagnosis Date Noted   Bleeding from dialysis shunt ([place]) 07/05/2022   Status post surgery 07/04/2022   Presence  of Watchman left atrial appendage closure device 02/06/2022   Atrial fibrillation ([place]) 02/06/2022   Polyp of transverse colon    AVM (arteriovenous malformation) of colon, acquired with hemorrhage    Symptomatic anemia 09/26/2021   Gastritis and gastroduodenitis    Hematochezia    OSA (obstructive sleep apnea) 07/19/2021   Atherosclerosis of aorta ([place]) 05/13/2021   Unintentional weight loss 05/08/2021   Dysphagia 05/08/2021   Chronic left sacroiliac pain 05/07/2021   GI bleed 01/31/2021   Gastric intestinal metaplasia 10/18/2020   Dyspepsia 10/18/2020   Chronic constipation 10/18/2020   Nausea without vomiting 05/29/2020   Generalized osteoarthritis of multiple sites 03/27/2020   Chronic pain disorder 03/27/2020   Acute GI bleeding 01/13/2020   Duodenal ulcer with hemorrhage    Lower GI bleed 01/12/2020   Intestinal metaplasia of gastric mucosa 12/04/2019   Gastric polyp 12/04/2019   Tubulovillous adenoma of  small intestine 12/03/2019   Adenomatous duodenal polyp 12/03/2019   Abnormal endoscopy of upper gastrointestinal tract 12/03/2019   Depression, major, recurrent, moderate ([place]) 09/22/2018   Orthostatic hypotension 09/16/2018   Bilateral lower extremity edema 08/24/2018   Abdominal pain, diffuse 05/11/2018   Unilateral primary osteoarthritis, right knee 03/18/2018   Chronic pain of right knee 03/18/2018   Abnormal facial hair 11/10/2017   Chronic anticoagulation 11/10/2017   GERD (gastroesophageal reflux disease) 06/16/2017   Constipation 10/16/2016   Diverticulitis of large intestine with perforation 06/26/2016   Anemia in other chronic diseases classified elsewhere 06/25/2016   Chest pain with moderate risk for cardiac etiology 06/25/2016   ESRD on hemodialysis ([place]) 06/30/2012   Hypertension with renal disease 06/30/2012    Immunization History  Administered Date(s) Administered   Fluad Quad(high Dose 65+) 04/15/2022   Influenza, High Dose Seasonal PF 04/08/2019, 05/07/2020, 04/26/2021   Influenza,inj,quad, With Preservative 04/12/2018   Influenza-Unspecified 04/20/2016, 05/21/2018   PFIZER(Purple Top)SARS-COV-2 Vaccination 09/03/2019, 09/25/2019, 05/26/2020   Pneumococcal Conjugate-13 07/20/2015   Pneumococcal Polysaccharide-23 10/12/2012   Tdap 06/20/2015   Patient reports she had a lot of stress this past week with her bleeding incident. She didn't have anything on hand to hold the bleeding and that's why she had to go to the hospital on Friday. She had to have emergency surgery. Patient is feeling a little better today than she did over the weekend. Patent now doesn't have to have the surgery this week.  Patient wasn't sleeping well and was scared to go to sleep the past couple of nights because her episode happened during the night. She got a good sleep last night. She is slowly getting her appetite back as well but her BP has been low yesterday and today. Patient's BP was  also low prior to dialysis. She knows why her BP is acting up and they were pulling up too much fluid and she was eating a lot for the holidays.  Conditions to be addressed/monitored:  Hypertension, Hyperlipidemia, Atrial Fibrillation, GERD, and Constipation  Conditions addressed this visit: Hypertension, Afib   Care Plan : [place]  Updates made by Viona Gilmore, [place] since 07/09/2022 12:00 AM     Problem: Problem: Hypotension, Hyperlipidemia, Atrial Fibrillation, GERD, and Constipation      Long-Range Goal: Patient-Specific Goal   Start Date: 08/13/2021  Expected End Date: 08/14/2021  Recent Progress: On track  Priority: High  Note:   Current Barriers:  Unable to independently afford treatment regimen Unable to maintain control of low blood pressure  Pharmacist Clinical Goal(s):  Patient  will achieve adherence to monitoring guidelines and medication adherence to achieve therapeutic efficacy maintain control of blood pressure as evidenced by home blood pressure readings  through collaboration with PharmD and provider.   Interventions: 1:1 collaboration with [name], [name], MD regarding development and update of comprehensive plan of care as evidenced by provider attestation and co-signature Inter-disciplinary care team collaboration (see longitudinal plan of care) Comprehensive medication review performed; medication list updated in electronic medical record  Hypotension/Hypertension (BP goal > 110/60) -Not ideally controlled -Current treatment: Carvedilol 6.25 mg 2 tablets twice daily - Appropriate, Query effective, Query Safe, Accessible -Medications previously tried: midodrine (not taking it anymore) -Current home readings: 108/58 (when sitting down) -Current dietary habits: not eating any salt -Current exercise habits: not able to exercise -Reports hypotensive/hypertensive symptoms -Educated on Importance of home blood pressure monitoring; Proper BP  monitoring technique; Symptoms of hypotension and importance of maintaining adequate hydration; -Counseled to monitor BP at home daily, document, and provide log at future appointments -Recommended to continue current medication  Hyperlipidemia: (LDL goal < 100) -Uncontrolled -Current treatment: Atorvastatin 10 mg 1 tablet daily - Appropriate, Query effective, Safe, Accessible   -Medications previously tried: none  -Current dietary patterns: cannot eat red meat, chicken or fish (makes her sick); cannot do many fruits and vegetables  -Current exercise habits: unable to exercise -Educated on Cholesterol goals;  Benefits of statin for ASCVD risk reduction; Importance of limiting foods high in cholesterol; -Counseled on diet and exercise extensively Recommended to continue current medication Recommended repeat lipid panel.  Atrial Fibrillation (Goal: prevent stroke and major bleeding) -Controlled -CHADSVASC: 4 -Current treatment: Rate control: carvedilol 6.25 mg 2 tablets twice daily - Appropriate, Effective, Safe, Accessible Anticoagulation: none -Medications previously tried: amiodarone, warfarin (had watchman procedure) -Home BP and HR readings: BP readings go directly to cardiology -Counseled on increased risk of stroke due to Afib and benefits of anticoagulation for stroke prevention; bleeding risk associated with warfarin and importance of self-monitoring for signs/symptoms of bleeding; avoidance of NSAIDs due to increased bleeding risk with anticoagulants; -Recommended to continue current medication  GERD (Goal: minimize symptoms) -Controlled -Current treatment  Lansoprazole 30 mg 1 tablet twice daily - Appropriate, Effective, Safe, Accessible -Medications previously tried: Pantoprazole (switched to omeprazole), omeprazole (switched to pantoprazole with DDI with Plavix) -Counseled on non-pharmacologic management of symptoms such as elevating the head of your bed, avoiding  eating 2-3 hours before bed, avoiding triggering foods such as acidic, spicy, or fatty foods, eating smaller meals, and wearing clothes that are loose around the waist Recommended as needed use of pantoprazole.  Constipation (Goal: minimize symptoms) -Controlled -Current treatment  Linzess 145 mcg 1 capsule once daily - Appropriate, Effective, Safe, Accessible -Medications previously tried: stool softeners  -Recommended to continue current medication  Antiplatelet therapy post watchman (Goal: prevent clots) -Controlled -Current treatment  Aspirin 81 mg 1 tablet daily - Appropriate, Effective, Safe, Accessible Clopidogrel 75 mg 1 tablet daily - Appropriate, Effective, Safe, Accessible -Medications previously tried: none  -Recommended to continue current medication   Health Maintenance -Vaccine gaps: shingrix, COVID booster -Current therapy:  Calcitriol three times weekly (takes at dialysis) Ethyl chloride spray (on dialysis days) Dialyvite 0.8 mg 1 tablet up to three times daily (taking every time she eats; gets it through dialysis pharmacy) -Educated on Cost vs benefit of each product must be carefully weighed by individual consumer -Patient is satisfied with current therapy and denies issues -Recommended to continue current medication  Patient Goals/Self-Care Activities Patient will:  -  take medications as prescribed as evidenced by patient report and record review check blood pressure daily, document, and provide at future appointments  Follow Up Plan: The care management team will reach out to the patient again over the next 14 days.         Medication Assistance: None required.  Patient affirms current coverage meets needs.  Compliance/Adherence/Medication fill history: Care Gaps: Shingrix, COVID booster Last BP - 174/73 on 07/02/2022  Star-Rating Drugs: Atorvastatin 10 mg - last filled 06/02/2022 90 DS at [facility]  Patient's preferred pharmacy is:  [facility] - [place], [place] - [address] [place] [address] Phone: [phone] Fax: [phone]  Uses pill box? No - not taking many medications scheduled Pt endorses 95% compliance  We discussed: Benefits of medication synchronization, packaging and delivery as well as enhanced pharmacist oversight with [facility]. Patient decided to: Utilize [facility] pharmacy for medication synchronization, packaging and delivery  Care Plan and Follow Up Patient Decision:  Patient agrees to Care Plan and Follow-up.  Plan: The care management team will reach out to the patient again over the next 14 days.  Jeni Salles, PharmD, [place] Pharmacist [place] at [place]

## 2022-07-08 NOTE — Progress Notes (Signed)
Called pt for pre-op call. Pt states she had this surgery last week emergently. She states she called the office on Monday and spoke with a staff person and told them to cancel the surgery scheduled for 07/10/22. She states she was then scheduled for a follow up with Dr. Stanford Breed.

## 2022-07-09 DIAGNOSIS — N2581 Secondary hyperparathyroidism of renal origin: Secondary | ICD-10-CM | POA: Diagnosis not present

## 2022-07-09 DIAGNOSIS — Z992 Dependence on renal dialysis: Secondary | ICD-10-CM | POA: Diagnosis not present

## 2022-07-09 DIAGNOSIS — D631 Anemia in chronic kidney disease: Secondary | ICD-10-CM | POA: Diagnosis not present

## 2022-07-09 DIAGNOSIS — N186 End stage renal disease: Secondary | ICD-10-CM | POA: Diagnosis not present

## 2022-07-09 NOTE — Patient Instructions (Addendum)
Hi Jane [name],  It was great to catch up again! Please keep on checking your blood pressure and follow up with cardiology by Friday if it still hasn't come up.  Please reach out to me if you have any questions or need anything before our follow up!  Best, Maddie  Jeni Salles, PharmD, [facility] [place]   Visit Information   Goals Addressed   None    Patient Care Plan: CCM Pharmacy Care Plan     Problem Identified: Problem: Hypotension, Hyperlipidemia, Atrial Fibrillation, GERD, and Constipation      Long-Range Goal: Patient-Specific Goal   Start Date: 08/13/2021  Expected End Date: 08/14/2021  Recent Progress: On track  Priority: High  Note:   Current Barriers:  Unable to independently afford treatment regimen Unable to maintain control of low blood pressure  Pharmacist Clinical Goal(s):  Patient will achieve adherence to monitoring guidelines and medication adherence to achieve therapeutic efficacy maintain control of blood pressure as evidenced by home blood pressure readings  through collaboration with PharmD and provider.   Interventions: 1:1 collaboration with [name], [name], MD regarding development and update of comprehensive plan of care as evidenced by provider attestation and co-signature Inter-disciplinary care team collaboration (see longitudinal plan of care) Comprehensive medication review performed; medication list updated in electronic medical record  Hypotension/Hypertension (BP goal > 110/60) -Not ideally controlled -Current treatment: Carvedilol 6.25 mg 2 tablets twice daily - Appropriate, Query effective, Query Safe, Accessible -Medications previously tried: midodrine (not taking it anymore) -Current home readings: 108/58 (when sitting down) -Current dietary habits: not eating any salt -Current exercise habits: not able to exercise -Reports hypotensive/hypertensive symptoms -Educated on  Importance of home blood pressure monitoring; Proper BP monitoring technique; Symptoms of hypotension and importance of maintaining adequate hydration; -Counseled to monitor BP at home daily, document, and provide log at future appointments -Recommended to continue current medication  Hyperlipidemia: (LDL goal < 100) -Uncontrolled -Current treatment: Atorvastatin 10 mg 1 tablet daily - Appropriate, Query effective, Safe, Accessible   -Medications previously tried: none  -Current dietary patterns: cannot eat red meat, chicken or fish (makes her sick); cannot do many fruits and vegetables  -Current exercise habits: unable to exercise -Educated on Cholesterol goals;  Benefits of statin for ASCVD risk reduction; Importance of limiting foods high in cholesterol; -Counseled on diet and exercise extensively Recommended to continue current medication Recommended repeat lipid panel.  Atrial Fibrillation (Goal: prevent stroke and major bleeding) -Controlled -CHADSVASC: 4 -Current treatment: Rate control: carvedilol 6.25 mg 2 tablets twice daily - Appropriate, Effective, Safe, Accessible Anticoagulation: none -Medications previously tried: amiodarone, warfarin (had watchman procedure) -Home BP and HR readings: BP readings go directly to cardiology -Counseled on increased risk of stroke due to Afib and benefits of anticoagulation for stroke prevention; bleeding risk associated with warfarin and importance of self-monitoring for signs/symptoms of bleeding; avoidance of NSAIDs due to increased bleeding risk with anticoagulants; -Recommended to continue current medication  GERD (Goal: minimize symptoms) -Controlled -Current treatment  Lansoprazole 30 mg 1 tablet twice daily - Appropriate, Effective, Safe, Accessible -Medications previously tried: Pantoprazole (switched to omeprazole), omeprazole (switched to pantoprazole with DDI with Plavix) -Counseled on non-pharmacologic management of  symptoms such as elevating the head of your bed, avoiding eating 2-3 hours before bed, avoiding triggering foods such as acidic, spicy, or fatty foods, eating smaller meals, and wearing clothes that are loose around the waist Recommended as needed use of pantoprazole.  Constipation (Goal: minimize symptoms) -Controlled -  Current treatment  Linzess 145 mcg 1 capsule once daily - Appropriate, Effective, Safe, Accessible -Medications previously tried: stool softeners  -Recommended to continue current medication  Antiplatelet therapy post watchman (Goal: prevent clots) -Controlled -Current treatment  Aspirin 81 mg 1 tablet daily - Appropriate, Effective, Safe, Accessible Clopidogrel 75 mg 1 tablet daily - Appropriate, Effective, Safe, Accessible -Medications previously tried: none  -Recommended to continue current medication   Health Maintenance -Vaccine gaps: shingrix, COVID booster -Current therapy:  Calcitriol three times weekly (takes at dialysis) Ethyl chloride spray (on dialysis days) Dialyvite 0.8 mg 1 tablet up to three times daily (taking every time she eats; gets it through dialysis pharmacy) -Educated on Cost vs benefit of each product must be carefully weighed by individual consumer -Patient is satisfied with current therapy and denies issues -Recommended to continue current medication  Patient Goals/Self-Care Activities Patient will:  - take medications as prescribed as evidenced by patient report and record review check blood pressure daily, document, and provide at future appointments  Follow Up Plan: The care management team will reach out to the patient again over the next 14 days.       Patient Care Plan: LCSW Plan of Care     Problem Identified: Coping Skills (General Plan of Care)      Goal: Coping Skills Enhanced   Start Date: 08/29/2021  This Visit's Progress: On track  Recent Progress: On track  Priority: High  Note:   Current barriers:    Financial  constraints related to affording oxygen Clinical Goals: Patient will work with CCM LCSW to address needs related to management of health conditions Clinical Interventions:  Assessment of needs, barriers , agencies contacted, as well as how impacting care 3/2: Patient reports she has been feeling very sick towards the end of her dialysis tx and her blood pressure been low. As a result, she reports decreased mobility. Pt agreed to discuss concerns with providers Patient completed a sleep study and was informed oxygen levels are low. States she needs oxygen (2 liters) when she sleeps, which will be approx $16 a month; however, pt is experiencing hardship financially so she declined services 3/2: CCM LCSW contacted [place] with patient. LCSW inquired if patient was able to pay for annual membership with a check, representative accepts debit/credit card only. LCSW will collaborate with [facility] staff regarding payment options A[employer] will only will assist if she was a member but membership wasn't beneficial. It will be $16 yearly for membership and Jeanine found resources. CCM LCSW will collaborate with care guide Pt receives support from LCSW at dialysis regarding transportation resources. Pt's SNAP benefits were terminated after a small increase in disability. Pt is experiencing difficulty with food restrictions due to chronic health conditions. LCSW discussed supportive resources Patient was successful in identifying healthy strategies to cope with stress (praying, walking) She receives support from daughter and son and law resides local Mindfulness or Relaxation training provided Active listening / Reflection utilized  Emotional Support Provided Problem [place] strategies reviewed Verbalization of feelings encouraged  Review various resources, discussed options and provided patient information about  Community food options  1:1 collaboration with primary care provider regarding development and update  of comprehensive plan of care as evidenced by provider attestation and co-signature Inter-disciplinary care team collaboration (see longitudinal plan of care) Patient Goals/Self-Care Activities: Over the next 120 days Attend all scheduled medical appointments Utilize healthy coping skills and/or supportive resources discussed Contact PCP office with any questions or concerns  Patient verbalizes understanding of instructions and care plan provided today and agrees to view in [place]. Active MyChart status and patient understanding of how to access instructions and care plan via MyChart confirmed with patient.    The pharmacy team will reach out to the patient again over the next 14 days.   Viona Gilmore, [facility]

## 2022-07-10 ENCOUNTER — Ambulatory Visit (HOSPITAL_COMMUNITY): Admission: RE | Admit: 2022-07-10 | Payer: Medicare Other | Source: Ambulatory Visit | Admitting: Vascular Surgery

## 2022-07-10 SURGERY — REVISON OF ARTERIOVENOUS FISTULA
Anesthesia: Choice | Laterality: Right

## 2022-07-10 NOTE — Discharge Summary (Signed)
Discharge Summary  [name] [id number] 76 y.o. 08/20/1945  Admit date: 07/04/2022  Discharge date and time: 07/05/2022 11:55 AM   Admitting Physician: Cherre Robins, MD   Discharge Physician: Cherre Robins, MD   Admission Diagnoses: Status post surgery [Z98.890] Bleeding from dialysis shunt [facility]) [R42.706C]  Discharge Diagnoses: Status post surgery [Z98.890] Bleeding from dialysis shunt [facility]) [B76.283T]  Admission Condition: fair  Discharged Condition: good  Indication for Admission: Jane [name] is a 76 y.o. female with bleeding from an ulcerated right arm arteriovenous fistula. After careful discussion of risks, benefits, and alternatives the patient was offered revision. The patient understood and wished to proceed.   Hospital Course: The patient was admitted to the hospital on 07/04/2022 and underwent revision of right arm AV fistula due to bleeding ulceration.  She tolerated the procedure well and was transferred to the PACU in stable condition.  POD 1, the patient's pain is well-controlled.  Her hemoglobin was stable and she was asymptomatic.  She had a usable amount of fistula both above and below the staple line from revision.  She was tolerating a normal diet without difficulty and ambulating well.  She was discharged home on POD 1.  Plans were for her to resume dialysis with this fistula, with cannulation sites either above or below the staple line.  Consults: None  Treatments: IV hydration, antibiotics: Ancef, cardiac meds: carvedilol and diltiazem, anticoagulation: ASA and Plavix, and surgery: Revision of bleeding right arm AV fistula  Discharge Exam: See progress note  Vitals:   07/05/22 0521 07/05/22 0748  BP: (!) 112/48 113/61  Pulse:  70  Resp:  17  Temp: 98.6 F (37 C) 98.1 F (36.7 C)  SpO2:  98%     Disposition: Discharge disposition: 01-Home or Self Care       Patient Instructions:  Allergies as of 07/05/2022        Reactions   Latex Rash   Penicillins Other (See Comments)   Yeast infection / Childhood   Sulfa Antibiotics Rash   Tape Other (See Comments)   Plastic, silicone, and paper tape causes bruising and pulls off skin. Cloth tape works fine   Reglan [metoclopramide] Itching   Itchiness, bug crawling sensation         Medication List     TAKE these medications    aspirin EC 81 MG tablet Take 1 tablet (81 mg total) by mouth daily. Swallow whole.   atorvastatin 10 MG tablet Commonly known as: LIPITOR Take 1 tablet (10 mg total) by mouth daily.   carvedilol 6.25 MG tablet Commonly known as: COREG Take 12.5 mg by mouth 2 (two) times daily.   cinacalcet 30 MG tablet Commonly known as: SENSIPAR Take 30 mg by mouth every Monday, Wednesday, and Friday with hemodialysis. Post dialysis   clopidogrel 75 MG tablet Commonly known as: PLAVIX Take 1 tablet (75 mg total) by mouth daily.   DIALYVITE 800 WITH ZINC 0.8 MG Tabs Take 1 tablet by mouth at bedtime.   diltiazem 60 MG tablet Commonly known as: CARDIZEM Take 1 tablet (60 mg total) by mouth 3 (three) times daily as needed (Atrial fibrillation).   ethyl chloride spray Apply 1 application topically every Monday, Wednesday, and Friday with hemodialysis.   famotidine 40 MG tablet Commonly known as: PEPCID Take 1 tablet (40 mg total) by mouth 2 (two) times daily.   lansoprazole 30 MG capsule Commonly known as: Prevacid Take 1 capsule (30 mg total) by  mouth 2 (two) times daily.   Linzess 72 MCG capsule Generic drug: linaclotide TAKE ONE CAPSULE BY MOUTH BEFORE BREAKFAST   MIRCERA IJ Inject 1 Dose into the vein every Monday, Wednesday, and Friday with hemodialysis.   sevelamer carbonate 800 MG tablet Commonly known as: RENVELA Take 1,600 mg by mouth in the morning and at bedtime. With meals take 800 mg with snack               Discharge Care Instructions  (From admission, onward)           Start     Ordered    07/05/22 0000  Discharge wound care:       Comments: Keep incision clean and dry. Can shower daily. Wash incision with soap and water, pat dry. Can dress with gauze bandage if needed   07/05/22 0943           Activity: activity as tolerated, no driving while on analgesics, and no heavy lifting for 3 weeks Diet: low fat, low cholesterol diet and renal diet Wound Care: keep wound clean and dry  Follow-up with [facility] in  2-3  weeks.  SignedGerri Lins, PA-C 07/10/2022 9:37 AM [facility] Office: [phone]

## 2022-07-11 DIAGNOSIS — N2581 Secondary hyperparathyroidism of renal origin: Secondary | ICD-10-CM | POA: Diagnosis not present

## 2022-07-11 DIAGNOSIS — D631 Anemia in chronic kidney disease: Secondary | ICD-10-CM | POA: Diagnosis not present

## 2022-07-11 DIAGNOSIS — N186 End stage renal disease: Secondary | ICD-10-CM | POA: Diagnosis not present

## 2022-07-11 DIAGNOSIS — Z992 Dependence on renal dialysis: Secondary | ICD-10-CM | POA: Diagnosis not present

## 2022-07-13 DIAGNOSIS — N2581 Secondary hyperparathyroidism of renal origin: Secondary | ICD-10-CM | POA: Diagnosis not present

## 2022-07-13 DIAGNOSIS — Z992 Dependence on renal dialysis: Secondary | ICD-10-CM | POA: Diagnosis not present

## 2022-07-13 DIAGNOSIS — D631 Anemia in chronic kidney disease: Secondary | ICD-10-CM | POA: Diagnosis not present

## 2022-07-13 DIAGNOSIS — N186 End stage renal disease: Secondary | ICD-10-CM | POA: Diagnosis not present

## 2022-07-16 DIAGNOSIS — D631 Anemia in chronic kidney disease: Secondary | ICD-10-CM | POA: Diagnosis not present

## 2022-07-16 DIAGNOSIS — N186 End stage renal disease: Secondary | ICD-10-CM | POA: Diagnosis not present

## 2022-07-16 DIAGNOSIS — Z992 Dependence on renal dialysis: Secondary | ICD-10-CM | POA: Diagnosis not present

## 2022-07-16 DIAGNOSIS — N2581 Secondary hyperparathyroidism of renal origin: Secondary | ICD-10-CM | POA: Diagnosis not present

## 2022-07-17 ENCOUNTER — Ambulatory Visit: Payer: Medicare Other | Admitting: Cardiology

## 2022-07-18 DIAGNOSIS — Z992 Dependence on renal dialysis: Secondary | ICD-10-CM | POA: Diagnosis not present

## 2022-07-18 DIAGNOSIS — D631 Anemia in chronic kidney disease: Secondary | ICD-10-CM | POA: Diagnosis not present

## 2022-07-18 DIAGNOSIS — N186 End stage renal disease: Secondary | ICD-10-CM | POA: Diagnosis not present

## 2022-07-18 DIAGNOSIS — N2581 Secondary hyperparathyroidism of renal origin: Secondary | ICD-10-CM | POA: Diagnosis not present

## 2022-07-20 DIAGNOSIS — I480 Paroxysmal atrial fibrillation: Secondary | ICD-10-CM

## 2022-07-20 DIAGNOSIS — Z992 Dependence on renal dialysis: Secondary | ICD-10-CM | POA: Diagnosis not present

## 2022-07-20 DIAGNOSIS — I129 Hypertensive chronic kidney disease with stage 1 through stage 4 chronic kidney disease, or unspecified chronic kidney disease: Secondary | ICD-10-CM

## 2022-07-20 DIAGNOSIS — N186 End stage renal disease: Secondary | ICD-10-CM | POA: Diagnosis not present

## 2022-07-20 DIAGNOSIS — D631 Anemia in chronic kidney disease: Secondary | ICD-10-CM | POA: Diagnosis not present

## 2022-07-20 DIAGNOSIS — N2581 Secondary hyperparathyroidism of renal origin: Secondary | ICD-10-CM | POA: Diagnosis not present

## 2022-07-21 DIAGNOSIS — N186 End stage renal disease: Secondary | ICD-10-CM | POA: Diagnosis not present

## 2022-07-21 DIAGNOSIS — I158 Other secondary hypertension: Secondary | ICD-10-CM | POA: Diagnosis not present

## 2022-07-21 DIAGNOSIS — Z992 Dependence on renal dialysis: Secondary | ICD-10-CM | POA: Diagnosis not present

## 2022-07-22 NOTE — Progress Notes (Signed)
HPI:  [name] is a 77 y.o. female, who is here today to follow on recent ED visit. Evaluated in the ED on 07/04/2022, she was hospitalized at that time. Status post revision of right forearm AV fistula. Lab Results  Component Value Date   WBC 5.8 07/04/2022   HGB 11.7 (L) 07/04/2022   HCT 39.8 07/04/2022   MCV 98.3 07/04/2022   PLT 45 (L) 07/04/2022    Aortic atherosclerosis and hyperlipidemia on atorvastatin 10 mg daily and Aspirin 81 mg daily. Hypertension on carvedilol 6.25 mg 2 tablets twice daily and diltiazem 60 mg 3 times per day as needed.    07/23/2022    1:51 PM 04/22/2022    2:01 PM 04/01/2022    8:15 AM 03/13/2022   10:54 AM 02/28/2022    9:25 AM  Depression screen PHQ 2/9  Decreased Interest 0 0 3 0 0  Down, Depressed, Hopeless 0 0 0 0 0  PHQ - 2 Score 0 0 3 0 0  Altered sleeping 3  3 0   Tired, decreased energy 2  3 0   Change in appetite 0  0 0   Feeling bad or failure about yourself  0  0 0   Trouble concentrating 0  0 0   Moving slowly or fidgety/restless 0  0 0   Suicidal thoughts 0  0 0   PHQ-9 Score 5  9 0   Difficult doing work/chores Not difficult at all  Not difficult at all Not difficult at all     Review of Systems See other pertinent positives and negatives in HPI.  Current Outpatient Medications on File Prior to Visit  Medication Sig Dispense Refill   aspirin EC 81 MG tablet Take 1 tablet (81 mg total) by mouth daily. Swallow whole. 30 tablet 12   atorvastatin (LIPITOR) 10 MG tablet Take 1 tablet (10 mg total) by mouth daily. 90 tablet 1   B Complex-C-Zn-Folic Acid (DIALYVITE 865 WITH ZINC) 0.8 MG TABS Take 1 tablet by mouth at bedtime.     cinacalcet (SENSIPAR) 30 MG tablet Take 30 mg by mouth every Monday, Wednesday, and Friday with hemodialysis. Post dialysis     clopidogrel (PLAVIX) 75 MG tablet Take 1 tablet (75 mg total) by mouth daily. 90 tablet 1   diltiazem (CARDIZEM) 60 MG tablet Take 1 tablet (60 mg total) by mouth 3 (three)  times daily as needed (Atrial fibrillation). 30 tablet 1   ethyl chloride spray Apply 1 application topically every Monday, Wednesday, and Friday with hemodialysis.  12   famotidine (PEPCID) 40 MG tablet Take 1 tablet (40 mg total) by mouth 2 (two) times daily. 180 tablet 1   lansoprazole (PREVACID) 30 MG capsule Take 1 capsule (30 mg total) by mouth 2 (two) times daily. 60 capsule 2   LINZESS 72 MCG capsule TAKE ONE CAPSULE BY MOUTH BEFORE BREAKFAST 30 capsule 2   Methoxy PEG-Epoetin Beta (MIRCERA IJ) Inject 1 Dose into the vein every Monday, Wednesday, and Friday with hemodialysis.     sevelamer carbonate (RENVELA) 800 MG tablet Take 1,600 mg by mouth in the morning and at bedtime. With meals take 800 mg with snack     Current Facility-Administered Medications on File Prior to Visit  Medication Dose Route Frequency Provider Last Rate Last Admin   betamethasone acetate-betamethasone sodium phosphate (CELESTONE) injection 12 mg  12 mg Intramuscular Once Kirsteins, Luanna Salk, MD       lidocaine (XYLOCAINE) 1 % (  with pres) injection 4 mL  4 mL Other Once Kirsteins, Luanna Salk, MD        Past Medical History:  Diagnosis Date   Acid reflux    Anemia of chronic disease    Arthritis    Asthma    Atrial fibrillation ([place])    AVM (arteriovenous malformation)    Bilateral carotid bruits    Complication of anesthesia    "hard to wake up, I have sleep apnea" no CPAP   Diverticulitis    Duodenal ulcer    Dysrhythmia    Afib   ESRD (end stage renal disease) ([place])    MWF [place]   Gallbladder sludge    GI bleed    Headache    Heart murmur    Hemorrhoids    History of blood transfusion    Hypertension    Malaise and fatigue    Orthostatic hypotension    PAF (paroxysmal atrial fibrillation) ([place])    Presence of Watchman left atrial appendage closure device 02/06/2022   Watchman FLX 28m with Dr. CBurt Knack  Shortness of breath    " when I walk to fast"   Sleep apnea    Syncope     Tubulovillous adenoma of colon    Allergies  Allergen Reactions   Latex Rash   Penicillins Other (See Comments)    Yeast infection / Childhood   Sulfa Antibiotics Rash   Tape Other (See Comments)    Plastic, silicone, and paper tape causes bruising and pulls off skin. Cloth tape works fine   Reglan [Metoclopramide] Itching    Itchiness, bug crawling sensation     Social History   Socioeconomic History   Marital status: Divorced    Spouse name: Not on file   Number of children: 4   Years of education: 14   Highest education level: Associate degree: occupational, tHotel manager or vocational program  Occupational History   Occupation: Retired  Tobacco Use   Smoking status: Never    Passive exposure: Never   Smokeless tobacco: Never  Vaping Use   Vaping Use: Never used  Substance and Sexual Activity   Alcohol use: No   Drug use: No   Sexual activity: Never  Other Topics Concern   Not on file  Social History Narrative   HH 1   Divorced   Outpatient dialysis Mon, Wed, Fri   4 children: 1 daughter locally is an EDesigner, multimediaand 3 sons [place]   Financial Resource Strain: High Risk (07/09/2022)   Overall Financial Resource Strain (CARDIA)    Difficulty of Paying Living Expenses: Hard  Food Insecurity: Food Insecurity Present (08/21/2021)   Hunger Vital Sign    Worried About [place] City[place] the Last Year: Often true    Ran Out of Food in the Last Year: Often true  Transportation Needs: Unmet Transportation Needs (08/21/2021)   PRAPARE - THydrologist(Medical): Yes    Lack of Transportation (Non-Medical): No  Physical Activity: Inactive (08/06/2021)   Exercise Vital Sign    Days of Exercise per Week: 0 days    Minutes of Exercise per Session: 0 min  Stress: Stress Concern Present (08/21/2021)   FGlen Allen   Feeling of Stress : Very much   Social Connections: Moderately Isolated (07/31/2020)   Social Connection and Isolation Panel [NHANES]    Frequency of  Communication with Friends and Family: More than three times a week    Frequency of Social Gatherings with Friends and Family: Never    Attends Religious Services: More than 4 times per year    Active Member of Clubs or Organizations: No    Attends Archivist Meetings: Never    Marital Status: Divorced    Vitals:   07/23/22 1343  BP: 136/72  Pulse: 76  Resp: 16  SpO2: 93%   Body mass index is 33.2 kg/m.  Physical Exam Vitals and nursing note reviewed.  Constitutional:      General: She is not in acute distress.    Appearance: She is well-developed.  HENT:     Head: Normocephalic and atraumatic.     Mouth/Throat:     Mouth: Mucous membranes are moist.     Pharynx: Oropharynx is clear.  Eyes:     Conjunctiva/sclera: Conjunctivae normal.  Cardiovascular:     Rate and Rhythm: Normal rate and regular rhythm.     Heart sounds: Murmur (SEM II/VI RUSB) heard.     Comments: Right AV fistula surgical wound with staple healing well.*** Pulmonary:     Effort: Pulmonary effort is normal. No respiratory distress.     Breath sounds: Normal breath sounds.  Abdominal:     General: Bowel sounds are normal.     Palpations: Abdomen is soft.     Tenderness: There is no abdominal tenderness.  Musculoskeletal:     Right lower leg: No edema.     Left lower leg: No edema.  Lymphadenopathy:     Cervical: No cervical adenopathy.  Skin:    General: Skin is warm.     Findings: No erythema or rash.  Neurological:     General: No focal deficit present.     Mental Status: She is alert and oriented to person, place, and time.     Cranial Nerves: No cranial nerve deficit.     Comments: Antalgic gait, not assisted today.  Psychiatric:     Comments: Well groomed, good eye contact.     ASSESSMENT AND PLAN:  Meilin was seen today for hospitalization  follow-up.  Diagnoses and all orders for this visit:  Hypertension with renal disease -     carvedilol (COREG) 12.5 MG tablet; Take 1 tablet (12.5 mg total) by mouth 2 (two) times daily with a meal.  ESRD on dialysis ([place])  Depression, major, recurrent, mild (HCC)  Atherosclerosis of aorta (HCC)  Paroxysmal atrial fibrillation (HCC) -     carvedilol (COREG) 12.5 MG tablet; Take 1 tablet (12.5 mg total) by mouth 2 (two) times daily with a meal.    No orders of the defined types were placed in this encounter.   No problem-specific Assessment & Plan notes found for this encounter.   Return in about 6 months (around 01/21/2023).  Lakeia Bradshaw G. [name], MD  [facility]. [place] office.

## 2022-07-23 ENCOUNTER — Ambulatory Visit (INDEPENDENT_AMBULATORY_CARE_PROVIDER_SITE_OTHER): Payer: Medicare Other | Admitting: Family Medicine

## 2022-07-23 ENCOUNTER — Encounter: Payer: Self-pay | Admitting: Family Medicine

## 2022-07-23 ENCOUNTER — Inpatient Hospital Stay: Payer: Medicare Other | Admitting: Family Medicine

## 2022-07-23 VITALS — BP 136/72 | HR 76 | Resp 16 | Ht 65.0 in | Wt 199.5 lb

## 2022-07-23 DIAGNOSIS — I480 Paroxysmal atrial fibrillation: Secondary | ICD-10-CM

## 2022-07-23 DIAGNOSIS — I129 Hypertensive chronic kidney disease with stage 1 through stage 4 chronic kidney disease, or unspecified chronic kidney disease: Secondary | ICD-10-CM | POA: Diagnosis not present

## 2022-07-23 DIAGNOSIS — N2581 Secondary hyperparathyroidism of renal origin: Secondary | ICD-10-CM | POA: Diagnosis not present

## 2022-07-23 DIAGNOSIS — N186 End stage renal disease: Secondary | ICD-10-CM | POA: Diagnosis not present

## 2022-07-23 DIAGNOSIS — D631 Anemia in chronic kidney disease: Secondary | ICD-10-CM | POA: Diagnosis not present

## 2022-07-23 DIAGNOSIS — D509 Iron deficiency anemia, unspecified: Secondary | ICD-10-CM | POA: Diagnosis not present

## 2022-07-23 DIAGNOSIS — I700 Atherosclerosis of aorta: Secondary | ICD-10-CM

## 2022-07-23 DIAGNOSIS — Z992 Dependence on renal dialysis: Secondary | ICD-10-CM | POA: Diagnosis not present

## 2022-07-23 DIAGNOSIS — F330 Major depressive disorder, recurrent, mild: Secondary | ICD-10-CM

## 2022-07-23 MED ORDER — CARVEDILOL 12.5 MG PO TABS: 12.5000 mg | ORAL_TABLET | Freq: Two times a day (BID) | ORAL | 1 refills | Status: DC

## 2022-07-23 NOTE — Patient Instructions (Addendum)
A few things to remember from today's visit:  Hypertension with renal disease - Plan: carvedilol (COREG) 12.5 MG tablet  ESRD on dialysis [facility]), Chronic  Atherosclerosis of aorta (HCC), Chronic  Paroxysmal atrial fibrillation ([place]), Chronic - Plan: carvedilol (COREG) 12.5 MG tablet No changes today. Continue monitoring blood pressure.  If you need refills for medications you take chronically, please call your pharmacy. Do not use My Chart to request refills or for acute issues that need immediate attention. If you send a my chart message, it may take a few days to be addressed, specially if I am not in the office.  Please be sure medication list is accurate. If a new problem present, please set up appointment sooner than planned today.

## 2022-07-23 NOTE — Assessment & Plan Note (Signed)
Rhythm and rate controlled. She has Diltiazem to take as needed. Not longer on anticoagulation. S/p watchman procedure. Follows with Dr Einar Gip.

## 2022-07-23 NOTE — Assessment & Plan Note (Signed)
Seen on 10/11/2020 abdominal CT.  On Atorvastatin 10 mg daily and Aspirin 81 mg daily, no changes today.

## 2022-07-23 NOTE — Assessment & Plan Note (Signed)
BP adequately controlled. Continue Carvedilol 12.5 mg bid and low salt diet. Continue monitoring BP regularly. F/U in 6 months, before if needed.

## 2022-07-24 ENCOUNTER — Encounter: Payer: Medicare Other | Attending: Physical Medicine & Rehabilitation | Admitting: Physical Medicine & Rehabilitation

## 2022-07-24 ENCOUNTER — Encounter: Payer: Self-pay | Admitting: Physical Medicine & Rehabilitation

## 2022-07-24 VITALS — BP 180/65 | HR 59 | Ht 65.0 in | Wt 201.0 lb

## 2022-07-24 DIAGNOSIS — M47816 Spondylosis without myelopathy or radiculopathy, lumbar region: Secondary | ICD-10-CM | POA: Diagnosis not present

## 2022-07-24 NOTE — Patient Instructions (Signed)
Will repeat the radiofrequency neurotomy left lumbar area

## 2022-07-24 NOTE — Progress Notes (Signed)
Subjective:    [name], female    DOB: 1946-02-08, 77 y.o.   MRN: [id number]  HPI 77 yo female with ESRD and lumbar spondylosis  Overall pain better, now able to cook for herself and is not in bed all day Left sided low back pain which limits walking increasing pain after a fall in Sept 2023.  [facility] graduated to walker, now only uses walker when going outside to get the mail  Takes tylenol for back pain on "bad days" up to 6 x '500mg'$    02/28/22 Right T12,L1,L2 medial branch radio frequency neurotomy under fluoroscopic guidance      01/02/22 Left Lumbar L3, L4  medial branch blocks and L 5 dorsal ramus injection under fluoroscopic guidance      11/14/21 Bilateral sacroiliac injections under fluoroscopic guidance     Pain Inventory Average Pain 8 Pain Right Now 9 My pain is sharp and aching  In the last 24 hours, has pain interfered with the following? General activity 8 Relation with others 9 Enjoyment of life 0 What TIME of day is your pain at its worst? morning , daytime, and evening Sleep (in general) Poor  Pain is worse with: walking, bending, sitting, and standing Pain improves with: medication and injections Relief from Meds: 6  Family History  Problem Relation Age of Onset   Heart failure Mother    Stroke Mother    Other Father    Colon cancer Neg Hx    Liver disease Neg Hx    Esophageal cancer Neg Hx    Stomach cancer Neg Hx    Inflammatory bowel disease Neg Hx    Rectal cancer Neg Hx    Pancreatic cancer Neg Hx    Social History   Socioeconomic History   Marital status: Divorced    Spouse name: Not on file   Number of children: 4   Years of education: 14   Highest education level: Associate degree: occupational, Hotel manager, or vocational program  Occupational History   Occupation: Retired  Tobacco Use   Smoking status: Never    Passive exposure: Never   Smokeless tobacco: Never  Vaping Use   Vaping Use: Never used   Substance and Sexual Activity   Alcohol use: No   Drug use: No   Sexual activity: Never  Other Topics Concern   Not on file  Social History Narrative   HH 1   Divorced   Outpatient dialysis Mon, Wed, Fri   4 children: 1 daughter locally is an Designer, multimedia and 3 sons in [place]   Social Determinants of Health   Financial Resource Strain: High Risk (07/09/2022)   Overall Financial Resource Strain (CARDIA)    Difficulty of Paying Living Expenses: Hard  Food Insecurity: Food Insecurity Present (08/21/2021)   Hunger Vital Sign    Worried About Running Out of Food in the Last Year: Often true    Ran Out of Food in the Last Year: Often true  Transportation Needs: Unmet Transportation Needs (08/21/2021)   PRAPARE - Hydrologist (Medical): Yes    Lack of Transportation (Non-Medical): No  Physical Activity: Inactive (08/06/2021)   Exercise Vital Sign    Days of Exercise per Week: 0 days    Minutes of Exercise per Session: 0 min  Stress: Stress Concern Present (08/21/2021)   [place]    Feeling of Stress : Very much  Social Connections: Moderately Isolated (07/31/2020)   Social Connection and Isolation Panel [NHANES]    Frequency of Communication with Friends and Family: More than three times a week    Frequency of Social Gatherings with Friends and Family: Never    Attends Religious Services: More than 4 times per year    Active Member of Clubs or Organizations: No    Attends Archivist Meetings: Never    Marital Status: Divorced   Past Surgical History:  Procedure Laterality Date   A/V FISTULAGRAM Right 10/18/2020   Procedure: A/V FISTULAGRAM;  Surgeon: Marty Heck, MD;  Location: [place] [facility];  Service: Cardiovascular;  Laterality: Right;   A/V FISTULAGRAM Right 09/05/2021   Procedure: A/V Fistulagram;  Surgeon: Marty Heck, MD;  Location: [place] [facility];   Service: Cardiovascular;  Laterality: Right;   AV FISTULA PLACEMENT     BACK SURGERY     Lumbar fusion L 4 and L 5   BIOPSY  01/09/2020   Procedure: BIOPSY;  Surgeon: Rush Landmark Telford Nab., MD;  Location: [place];  Service: Gastroenterology;;   BIOPSY  01/31/2021   Procedure: BIOPSY;  Surgeon: Irving Copas., MD;  Location: [place];  Service: Gastroenterology;;   BIOPSY  09/26/2021   Procedure: BIOPSY;  Surgeon: Thornton Park, MD;  Location: [place];  Service: Gastroenterology;;   COLONOSCOPY     COLONOSCOPY N/A 02/01/2021   Procedure: COLONOSCOPY;  Surgeon: Yetta Flock, MD;  Location: [place];  Service: Gastroenterology;  Laterality: N/A;   COLONOSCOPY WITH PROPOFOL N/A 09/26/2021   Procedure: COLONOSCOPY WITH PROPOFOL;  Surgeon: Thornton Park, MD;  Location: [place];  Service: Gastroenterology;  Laterality: N/A;   COLONOSCOPY WITH PROPOFOL N/A 09/29/2021   Procedure: COLONOSCOPY WITH PROPOFOL;  Surgeon: Thornton Park, MD;  Location: [place];  Service: Gastroenterology;  Laterality: N/A;   COLONOSCOPY WITH PROPOFOL N/A 10/01/2021   Procedure: COLONOSCOPY WITH PROPOFOL;  Surgeon: Mauri Pole, MD;  Location: [place];  Service: Gastroenterology;  Laterality: N/A;   ENDOSCOPIC MUCOSAL RESECTION N/A 01/09/2020   Procedure: ENDOSCOPIC MUCOSAL RESECTION;  Surgeon: Rush Landmark Telford Nab., MD;  Location: [place];  Service: Gastroenterology;  Laterality: N/A;   ENDOSCOPIC MUCOSAL RESECTION N/A 01/31/2021   Procedure: ENDOSCOPIC MUCOSAL RESECTION;  Surgeon: Rush Landmark Telford Nab., MD;  Location: [place];  Service: Gastroenterology;  Laterality: N/A;   ENTEROSCOPY N/A 09/26/2021   Procedure: ENTEROSCOPY;  Surgeon: Thornton Park, MD;  Location: [place];  Service: Gastroenterology;  Laterality: N/A;   ESOPHAGOGASTRODUODENOSCOPY     ESOPHAGOGASTRODUODENOSCOPY (EGD) WITH PROPOFOL N/A 01/09/2020   Procedure:  ESOPHAGOGASTRODUODENOSCOPY (EGD) WITH PROPOFOL;  Surgeon: Rush Landmark Telford Nab., MD;  Location: [place];  Service: Gastroenterology;  Laterality: N/A;   ESOPHAGOGASTRODUODENOSCOPY (EGD) WITH PROPOFOL N/A 01/13/2020   Procedure: ESOPHAGOGASTRODUODENOSCOPY (EGD) WITH PROPOFOL;  Surgeon: Irene Shipper, MD;  Location: [place];  Service: Gastroenterology;  Laterality: N/A;   ESOPHAGOGASTRODUODENOSCOPY (EGD) WITH PROPOFOL N/A 01/31/2021   Procedure: ESOPHAGOGASTRODUODENOSCOPY (EGD) WITH PROPOFOL;  Surgeon: Rush Landmark Telford Nab., MD;  Location: [place];  Service: Gastroenterology;  Laterality: N/A;   EUS N/A 01/09/2020   Procedure: UPPER ENDOSCOPIC ULTRASOUND (EUS) RADIAL;  Surgeon: Irving Copas., MD;  Location: [place];  Service: Gastroenterology;  Laterality: N/A;   GIVENS CAPSULE STUDY  09/29/2021   Procedure: GIVENS CAPSULE STUDY;  Surgeon: Thornton Park, MD;  Location: [facility];  Service: Gastroenterology;;   HEMOSTASIS CLIP PLACEMENT  01/09/2020   Procedure: HEMOSTASIS CLIP PLACEMENT;  Surgeon: Justice Britain  Brooke Bonito., MD;  Location: [place];  Service: Gastroenterology;;   HEMOSTASIS CLIP PLACEMENT  01/31/2021   Procedure: HEMOSTASIS CLIP PLACEMENT;  Surgeon: Irving Copas., MD;  Location: [place];  Service: Gastroenterology;;   HEMOSTASIS CONTROL  01/13/2020   Procedure: HEMOSTASIS CONTROL;  Surgeon: Irene Shipper, MD;  Location: [facility]COPY;  Service: Gastroenterology;;  hemaspray   HOT HEMOSTASIS N/A 01/13/2020   Procedure: HOT HEMOSTASIS (ARGON PLASMA COAGULATION/BICAP);  Surgeon: Irene Shipper, MD;  Location: [place];  Service: Gastroenterology;  Laterality: N/A;   HOT HEMOSTASIS N/A 02/01/2021   Procedure: HOT HEMOSTASIS (ARGON PLASMA COAGULATION/BICAP);  Surgeon: Yetta Flock, MD;  Location: [facility]COPY;  Service: Gastroenterology;  Laterality: N/A;   HOT HEMOSTASIS N/A 10/01/2021   Procedure: HOT HEMOSTASIS (ARGON PLASMA  COAGULATION/BICAP);  Surgeon: Mauri Pole, MD;  Location: [place];  Service: Gastroenterology;  Laterality: N/A;   IR GENERIC HISTORICAL  07/09/2016   IR US GUIDE VASC ACCESS RIGHT 07/09/2016 Arne Cleveland, MD [facility]   IR GENERIC HISTORICAL  07/09/2016   IR FLUORO GUIDE CV LINE RIGHT 07/09/2016 Arne Cleveland, MD [facility]   KNEE ARTHROPLASTY Left    LAPAROSCOPIC SIGMOID COLECTOMY N/A 07/11/2016   Procedure: LAPAROSCOPIC SIGMOID COLECTOMY;  Surgeon: Clovis Riley, MD;  Location: [place];  Service: General;  Laterality: N/A;   LEFT ATRIAL APPENDAGE OCCLUSION N/A 02/06/2022   Procedure: LEFT ATRIAL APPENDAGE OCCLUSION;  Surgeon: Sherren Mocha, MD;  Location: [place] [facility];  Service: Cardiovascular;  Laterality: N/A;   MASS EXCISION Right 04/10/2020   Procedure: EXCISION SKIN NODULE RIGHT FOREARM;  Surgeon: Waynetta Sandy, MD;  Location: [place];  Service: Vascular;  Laterality: Right;   POLYPECTOMY  09/26/2021   Procedure: POLYPECTOMY;  Surgeon: Thornton Park, MD;  Location: [place];  Service: Gastroenterology;;   POLYPECTOMY  10/01/2021   Procedure: POLYPECTOMY;  Surgeon: Mauri Pole, MD;  Location: [place];  Service: Gastroenterology;;   REVISON OF ARTERIOVENOUS FISTULA Right 07/04/2022   Procedure: REVISON OF RIGHT FOREARM ARTERIOVENOUS FISTULA;  Surgeon: Cherre Robins, MD;  Location: [place];  Service: Vascular;  Laterality: Right;   SCLEROTHERAPY  01/09/2020   Procedure: Clide Deutscher;  Surgeon: Mansouraty, Telford Nab., MD;  Location: [place];  Service: Gastroenterology;;   Clide Deutscher  01/13/2020   Procedure: Clide Deutscher;  Surgeon: Irene Shipper, MD;  Location: [place];  Service: Gastroenterology;;   SUBMUCOSAL LIFTING INJECTION  01/09/2020   Procedure: SUBMUCOSAL LIFTING INJECTION;  Surgeon: Irving Copas., MD;  Location: [place];  Service: Gastroenterology;;   TEE WITHOUT CARDIOVERSION N/A 02/06/2022    Procedure: TRANSESOPHAGEAL ECHOCARDIOGRAM (TEE);  Surgeon: Sherren Mocha, MD;  Location: [place] [facility];  Service: Cardiovascular;  Laterality: N/A;   TUBAL LIGATION     Past Surgical History:  Procedure Laterality Date   A/V FISTULAGRAM Right 10/18/2020   Procedure: A/V FISTULAGRAM;  Surgeon: Marty Heck, MD;  Location: [place] [facility];  Service: Cardiovascular;  Laterality: Right;   A/V FISTULAGRAM Right 09/05/2021   Procedure: A/V Fistulagram;  Surgeon: Marty Heck, MD;  Location: [place] [facility];  Service: Cardiovascular;  Laterality: Right;   AV FISTULA PLACEMENT     BACK SURGERY     Lumbar fusion L 4 and L 5   BIOPSY  01/09/2020   Procedure: BIOPSY;  Surgeon: Rush Landmark Telford Nab., MD;  Location: [place];  Service: Gastroenterology;;   BIOPSY  01/31/2021   Procedure: BIOPSY;  Surgeon: Irving Copas., MD;  [facility];  Service: Gastroenterology;;   BIOPSY  09/26/2021   Procedure: BIOPSY;  Surgeon: Thornton Park, MD;  Location: [place];  Service: Gastroenterology;;   COLONOSCOPY     COLONOSCOPY N/A 02/01/2021   Procedure: COLONOSCOPY;  Surgeon: Yetta Flock, MD;  Location: [place];  Service: Gastroenterology;  Laterality: N/A;   COLONOSCOPY WITH PROPOFOL N/A 09/26/2021   Procedure: COLONOSCOPY WITH PROPOFOL;  Surgeon: Thornton Park, MD;  Location: [place];  Service: Gastroenterology;  Laterality: N/A;   COLONOSCOPY WITH PROPOFOL N/A 09/29/2021   Procedure: COLONOSCOPY WITH PROPOFOL;  Surgeon: Thornton Park, MD;  Location: [place];  Service: Gastroenterology;  Laterality: N/A;   COLONOSCOPY WITH PROPOFOL N/A 10/01/2021   Procedure: COLONOSCOPY WITH PROPOFOL;  Surgeon: Mauri Pole, MD;  Location: [place];  Service: Gastroenterology;  Laterality: N/A;   ENDOSCOPIC MUCOSAL RESECTION N/A 01/09/2020   Procedure: ENDOSCOPIC MUCOSAL RESECTION;  Surgeon: Rush Landmark Telford Nab., MD;  Location: [place];  Service: Gastroenterology;  Laterality: N/A;   ENDOSCOPIC MUCOSAL RESECTION N/A 01/31/2021   Procedure: ENDOSCOPIC MUCOSAL RESECTION;  Surgeon: Rush Landmark Telford Nab., MD;  Location: [place];  Service: Gastroenterology;  Laterality: N/A;   ENTEROSCOPY N/A 09/26/2021   Procedure: ENTEROSCOPY;  Surgeon: Thornton Park, MD;  Location: [place];  Service: Gastroenterology;  Laterality: N/A;   ESOPHAGOGASTRODUODENOSCOPY     ESOPHAGOGASTRODUODENOSCOPY (EGD) WITH PROPOFOL N/A 01/09/2020   Procedure: ESOPHAGOGASTRODUODENOSCOPY (EGD) WITH PROPOFOL;  Surgeon: Rush Landmark Telford Nab., MD;  Location: [place];  Service: Gastroenterology;  Laterality: N/A;   ESOPHAGOGASTRODUODENOSCOPY (EGD) WITH PROPOFOL N/A 01/13/2020   Procedure: ESOPHAGOGASTRODUODENOSCOPY (EGD) WITH PROPOFOL;  Surgeon: Irene Shipper, MD;  Location: [place];  Service: Gastroenterology;  Laterality: N/A;   ESOPHAGOGASTRODUODENOSCOPY (EGD) WITH PROPOFOL N/A 01/31/2021   Procedure: ESOPHAGOGASTRODUODENOSCOPY (EGD) WITH PROPOFOL;  Surgeon: Rush Landmark Telford Nab., MD;  Location: [place];  Service: Gastroenterology;  Laterality: N/A;   EUS N/A 01/09/2020   Procedure: UPPER ENDOSCOPIC ULTRASOUND (EUS) RADIAL;  Surgeon: Irving Copas., MD;  Location: [place];  Service: Gastroenterology;  Laterality: N/A;   GIVENS CAPSULE STUDY  09/29/2021   Procedure: GIVENS CAPSULE STUDY;  Surgeon: Thornton Park, MD;  Location: [place];  Service: Gastroenterology;;   HEMOSTASIS CLIP PLACEMENT  01/09/2020   Procedure: HEMOSTASIS CLIP PLACEMENT;  Surgeon: Irving Copas., MD;  Location: [place];  Service: Gastroenterology;;   HEMOSTASIS CLIP PLACEMENT  01/31/2021   Procedure: HEMOSTASIS CLIP PLACEMENT;  Surgeon: Irving Copas., MD;  Location: [place];  Service: Gastroenterology;;   HEMOSTASIS CONTROL  01/13/2020   Procedure: HEMOSTASIS CONTROL;  Surgeon: Irene Shipper, MD;  Location: [place];  Service: Gastroenterology;;  hemaspray   HOT HEMOSTASIS N/A 01/13/2020   Procedure: HOT HEMOSTASIS (ARGON PLASMA COAGULATION/BICAP);  Surgeon: Irene Shipper, MD;  Location: [place];  Service: Gastroenterology;  Laterality: N/A;   HOT HEMOSTASIS N/A 02/01/2021   Procedure: HOT HEMOSTASIS (ARGON PLASMA COAGULATION/BICAP);  Surgeon: Yetta Flock, MD;  Location: [facility] ENDOSCOPY;  Service: Gastroenterology;  Laterality: N/A;   HOT HEMOSTASIS N/A 10/01/2021   Procedure: HOT HEMOSTASIS (ARGON PLASMA COAGULATION/BICAP);  Surgeon: Mauri Pole, MD;  Location: [place];  Service: Gastroenterology;  Laterality: N/A;   IR GENERIC HISTORICAL  07/09/2016   IR US GUIDE VASC ACCESS RIGHT 07/09/2016 Arne Cleveland, MD [facility]   IR GENERIC HISTORICAL  07/09/2016   IR FLUORO GUIDE CV LINE RIGHT 07/09/2016 Arne Cleveland, MD [facility]   KNEE ARTHROPLASTY Left    LAPAROSCOPIC SIGMOID COLECTOMY N/A  07/11/2016   Procedure: LAPAROSCOPIC SIGMOID COLECTOMY;  Surgeon: Clovis Riley, MD;  Location: [place];  Service: General;  Laterality: N/A;   LEFT ATRIAL APPENDAGE OCCLUSION N/A 02/06/2022   Procedure: LEFT ATRIAL APPENDAGE OCCLUSION;  Surgeon: Sherren Mocha, MD;  Location: [place] [facility];  Service: Cardiovascular;  Laterality: N/A;   MASS EXCISION Right 04/10/2020   Procedure: EXCISION SKIN NODULE RIGHT FOREARM;  Surgeon: Waynetta Sandy, MD;  Location: [place];  Service: Vascular;  Laterality: Right;   POLYPECTOMY  09/26/2021   Procedure: POLYPECTOMY;  Surgeon: Thornton Park, MD;  Location: [place];  Service: Gastroenterology;;   POLYPECTOMY  10/01/2021   Procedure: POLYPECTOMY;  Surgeon: Mauri Pole, MD;  Location: [place];  Service: Gastroenterology;;   REVISON OF ARTERIOVENOUS FISTULA Right 07/04/2022   Procedure: REVISON OF RIGHT FOREARM ARTERIOVENOUS FISTULA;  Surgeon: Cherre Robins, MD;  Location: [place];  Service: Vascular;  Laterality:  Right;   SCLEROTHERAPY  01/09/2020   Procedure: Clide Deutscher;  Surgeon: Mansouraty, Telford Nab., MD;  Location: [place];  Service: Gastroenterology;;   Clide Deutscher  01/13/2020   Procedure: Clide Deutscher;  Surgeon: Irene Shipper, MD;  Location: [place];  Service: Gastroenterology;;   SUBMUCOSAL LIFTING INJECTION  01/09/2020   Procedure: SUBMUCOSAL LIFTING INJECTION;  Surgeon: Irving Copas., MD;  Location: [place];  Service: Gastroenterology;;   TEE WITHOUT CARDIOVERSION N/A 02/06/2022   Procedure: TRANSESOPHAGEAL ECHOCARDIOGRAM (TEE);  Surgeon: Sherren Mocha, MD;  Location: [place] [facility];  Service: Cardiovascular;  Laterality: N/A;   TUBAL LIGATION     Past Medical History:  Diagnosis Date   Acid reflux    Anemia of chronic disease    Arthritis    Asthma    Atrial fibrillation ([place])    AVM (arteriovenous malformation)    Bilateral carotid bruits    Complication of anesthesia    "hard to wake up, I have sleep apnea" no CPAP   Diverticulitis    Duodenal ulcer    Dysrhythmia    Afib   ESRD (end stage renal disease) ([place])    MWF [place]   Gallbladder sludge    GI bleed    Headache    Heart murmur    Hemorrhoids    History of blood transfusion    Hypertension    Malaise and fatigue    Orthostatic hypotension    PAF (paroxysmal atrial fibrillation) ([place])    Presence of Watchman left atrial appendage closure device 02/06/2022   Watchman FLX 89m with Dr. CBurt Knack  Shortness of breath    " when I walk to fast"   Sleep apnea    Syncope    Tubulovillous adenoma of colon    BP (!) 180/65   Pulse (!) 59   Ht '5\' 5"'$  (1.651 m)   Wt 201 lb (91.2 kg)   SpO2 97%   BMI 33.45 kg/m   Opioid Risk Score:   Fall Risk Score:  `1  Depression screen PHQ 2/9     07/23/2022    1:51 PM 04/22/2022    2:01 PM 04/01/2022    8:15 AM 03/13/2022   10:54 AM 02/28/2022    9:25 AM 01/02/2022    9:58 AM 11/14/2021   12:56 PM  Depression screen PHQ 2/9   Decreased Interest 0 0 3 0 0 0 0  Down, Depressed, Hopeless 0 0 0 0 0 0 0  PHQ - 2 Score 0 0 3 0 0 0 0  Altered sleeping  3  3 0     Tired, decreased energy 2  3 0     Change in appetite 0  0 0     Feeling bad or failure about yourself  0  0 0     Trouble concentrating 0  0 0     Moving slowly or fidgety/restless 0  0 0     Suicidal thoughts 0  0 0     PHQ-9 Score 5  9 0     Difficult doing work/chores Not difficult at all  Not difficult at all Not difficult at all        Review of Systems  Musculoskeletal:  Positive for back pain.  All other systems reviewed and are negative.     Objective:   Physical Exam  Left lumbar tenderness  ROM mildly limited pain with lumbar ext and Right lateral lean  Neg SLR   Gen NAD  Hip ROM mildly limited with IR L>R Corky Sox + at Left groin but not at [facility] Neg thigh thrust No pain with pressure over SIJ    Assessment & Plan:    Lumbar spondylosis without myelopathy worsening pain as L3-4-5 RF wearing off Repeat Left L3-4-5 RF

## 2022-07-25 ENCOUNTER — Other Ambulatory Visit: Payer: Self-pay | Admitting: *Deleted

## 2022-07-25 DIAGNOSIS — N186 End stage renal disease: Secondary | ICD-10-CM | POA: Diagnosis not present

## 2022-07-25 DIAGNOSIS — Z992 Dependence on renal dialysis: Secondary | ICD-10-CM | POA: Diagnosis not present

## 2022-07-25 DIAGNOSIS — D509 Iron deficiency anemia, unspecified: Secondary | ICD-10-CM | POA: Diagnosis not present

## 2022-07-25 DIAGNOSIS — D631 Anemia in chronic kidney disease: Secondary | ICD-10-CM | POA: Diagnosis not present

## 2022-07-25 DIAGNOSIS — N2581 Secondary hyperparathyroidism of renal origin: Secondary | ICD-10-CM | POA: Diagnosis not present

## 2022-07-26 ENCOUNTER — Encounter (HOSPITAL_COMMUNITY): Payer: Self-pay | Admitting: Internal Medicine

## 2022-07-26 ENCOUNTER — Emergency Department (HOSPITAL_COMMUNITY): Payer: Medicare Other

## 2022-07-26 ENCOUNTER — Encounter (HOSPITAL_COMMUNITY)
Admission: EM | Disposition: A | Discharge status: Home / Self Care | Payer: Self-pay | Source: Home / Self Care | Attending: Emergency Medicine

## 2022-07-26 ENCOUNTER — Ambulatory Visit (HOSPITAL_COMMUNITY)
Admission: EM | Admit: 2022-07-26 | Discharge: 2022-07-27 | Disposition: A | Discharge status: Home / Self Care | Payer: Medicare Other | Attending: Emergency Medicine | Admitting: Emergency Medicine

## 2022-07-26 ENCOUNTER — Emergency Department (HOSPITAL_COMMUNITY): Payer: Medicare Other | Admitting: Anesthesiology

## 2022-07-26 ENCOUNTER — Emergency Department (EMERGENCY_DEPARTMENT_HOSPITAL): Payer: Medicare Other | Admitting: Anesthesiology

## 2022-07-26 DIAGNOSIS — R58 Hemorrhage, not elsewhere classified: Secondary | ICD-10-CM | POA: Diagnosis not present

## 2022-07-26 DIAGNOSIS — Z992 Dependence on renal dialysis: Secondary | ICD-10-CM | POA: Insufficient documentation

## 2022-07-26 DIAGNOSIS — T82838A Hemorrhage of vascular prosthetic devices, implants and grafts, initial encounter: Secondary | ICD-10-CM | POA: Insufficient documentation

## 2022-07-26 DIAGNOSIS — Z7902 Long term (current) use of antithrombotics/antiplatelets: Secondary | ICD-10-CM | POA: Insufficient documentation

## 2022-07-26 DIAGNOSIS — Y841 Kidney dialysis as the cause of abnormal reaction of the patient, or of later complication, without mention of misadventure at the time of the procedure: Secondary | ICD-10-CM | POA: Diagnosis not present

## 2022-07-26 DIAGNOSIS — N186 End stage renal disease: Secondary | ICD-10-CM | POA: Diagnosis not present

## 2022-07-26 DIAGNOSIS — I4891 Unspecified atrial fibrillation: Secondary | ICD-10-CM

## 2022-07-26 DIAGNOSIS — I480 Paroxysmal atrial fibrillation: Secondary | ICD-10-CM | POA: Insufficient documentation

## 2022-07-26 DIAGNOSIS — F32A Depression, unspecified: Secondary | ICD-10-CM | POA: Insufficient documentation

## 2022-07-26 DIAGNOSIS — J45909 Unspecified asthma, uncomplicated: Secondary | ICD-10-CM | POA: Diagnosis not present

## 2022-07-26 DIAGNOSIS — I120 Hypertensive chronic kidney disease with stage 5 chronic kidney disease or end stage renal disease: Secondary | ICD-10-CM | POA: Insufficient documentation

## 2022-07-26 DIAGNOSIS — I700 Atherosclerosis of aorta: Secondary | ICD-10-CM | POA: Insufficient documentation

## 2022-07-26 DIAGNOSIS — N185 Chronic kidney disease, stage 5: Secondary | ICD-10-CM | POA: Diagnosis not present

## 2022-07-26 DIAGNOSIS — T82898A Other specified complication of vascular prosthetic devices, implants and grafts, initial encounter: Secondary | ICD-10-CM | POA: Diagnosis not present

## 2022-07-26 DIAGNOSIS — G4730 Sleep apnea, unspecified: Secondary | ICD-10-CM | POA: Insufficient documentation

## 2022-07-26 DIAGNOSIS — Z79899 Other long term (current) drug therapy: Secondary | ICD-10-CM | POA: Diagnosis not present

## 2022-07-26 DIAGNOSIS — Z452 Encounter for adjustment and management of vascular access device: Secondary | ICD-10-CM | POA: Diagnosis not present

## 2022-07-26 DIAGNOSIS — I081 Rheumatic disorders of both mitral and tricuspid valves: Secondary | ICD-10-CM | POA: Insufficient documentation

## 2022-07-26 DIAGNOSIS — I517 Cardiomegaly: Secondary | ICD-10-CM | POA: Diagnosis not present

## 2022-07-26 DIAGNOSIS — D631 Anemia in chronic kidney disease: Secondary | ICD-10-CM | POA: Diagnosis not present

## 2022-07-26 DIAGNOSIS — K219 Gastro-esophageal reflux disease without esophagitis: Secondary | ICD-10-CM | POA: Diagnosis not present

## 2022-07-26 DIAGNOSIS — I10 Essential (primary) hypertension: Secondary | ICD-10-CM | POA: Diagnosis not present

## 2022-07-26 HISTORY — PX: INSERTION OF DIALYSIS CATHETER: SHX1324

## 2022-07-26 HISTORY — PX: LIGATION OF ARTERIOVENOUS  FISTULA: SHX5948

## 2022-07-26 HISTORY — PX: FALSE ANEURYSM REPAIR: SHX5152

## 2022-07-26 SURGERY — INSERTION OF DIALYSIS CATHETER
Anesthesia: General | Site: Chest | Laterality: Right

## 2022-07-26 MED ORDER — HEPARIN 6000 UNIT IRRIGATION SOLUTION
Status: DC | PRN
  Administered 2022-07-26: 1

## 2022-07-26 MED ORDER — FENTANYL CITRATE (PF) 100 MCG/2ML IJ SOLN: 25.0000 ug | INTRAMUSCULAR | Status: DC | PRN

## 2022-07-26 MED ORDER — FENTANYL CITRATE (PF) 250 MCG/5ML IJ SOLN
INTRAMUSCULAR | Status: AC
  Filled 2022-07-26: qty 5

## 2022-07-26 MED ORDER — ROCURONIUM BROMIDE 100 MG/10ML IV SOLN
INTRAVENOUS | Status: DC | PRN
  Administered 2022-07-26: 50 mg via INTRAVENOUS

## 2022-07-26 MED ORDER — AMISULPRIDE (ANTIEMETIC) 5 MG/2ML IV SOLN: 10.0000 mg | Freq: Once | INTRAVENOUS | Status: DC | PRN

## 2022-07-26 MED ORDER — CEFAZOLIN SODIUM-DEXTROSE 2-3 GM-%(50ML) IV SOLR
INTRAVENOUS | Status: DC | PRN
  Administered 2022-07-26: 2 g via INTRAVENOUS

## 2022-07-26 MED ORDER — ORAL CARE MOUTH RINSE: 15.0000 mL | Freq: Once | OROMUCOSAL | Status: DC

## 2022-07-26 MED ORDER — CHLORHEXIDINE GLUCONATE 0.12 % MT SOLN
OROMUCOSAL | Status: AC
  Filled 2022-07-26: qty 15

## 2022-07-26 MED ORDER — EPHEDRINE SULFATE (PRESSORS) 50 MG/ML IJ SOLN
INTRAMUSCULAR | Status: DC | PRN
  Administered 2022-07-26: 10 mg via INTRAVENOUS
  Administered 2022-07-26: 5 mg via INTRAVENOUS
  Administered 2022-07-26: 10 mg via INTRAVENOUS

## 2022-07-26 MED ORDER — HEPARIN 6000 UNIT IRRIGATION SOLUTION
Status: AC
  Filled 2022-07-26: qty 500

## 2022-07-26 MED ORDER — PROPOFOL 10 MG/ML IV BOLUS
INTRAVENOUS | Status: AC
  Filled 2022-07-26: qty 20

## 2022-07-26 MED ORDER — HEPARIN SODIUM (PORCINE) 1000 UNIT/ML IJ SOLN
INTRAMUSCULAR | Status: DC | PRN
  Administered 2022-07-26: 3.8 [IU] via INTRAVENOUS

## 2022-07-26 MED ORDER — FENTANYL CITRATE (PF) 100 MCG/2ML IJ SOLN
INTRAMUSCULAR | Status: AC
  Administered 2022-07-26: 50 ug
  Filled 2022-07-26: qty 2

## 2022-07-26 MED ORDER — SODIUM CHLORIDE 0.9 % IV SOLN: INTRAVENOUS | Status: DC

## 2022-07-26 MED ORDER — 0.9 % SODIUM CHLORIDE (POUR BTL) OPTIME
TOPICAL | Status: DC | PRN
  Administered 2022-07-26: 1000 mL

## 2022-07-26 MED ORDER — AMOXICILLIN-POT CLAVULANATE 500-125 MG PO TABS: 1.0000 | ORAL_TABLET | Freq: Two times a day (BID) | ORAL | 0 refills | Status: DC

## 2022-07-26 MED ORDER — VANCOMYCIN HCL IN DEXTROSE 1-5 GM/200ML-% IV SOLN
INTRAVENOUS | Status: AC
  Filled 2022-07-26: qty 200

## 2022-07-26 MED ORDER — VANCOMYCIN HCL IN DEXTROSE 1-5 GM/200ML-% IV SOLN: 1000.0000 mg | Freq: Once | INTRAVENOUS | Status: DC

## 2022-07-26 MED ORDER — FENTANYL CITRATE (PF) 250 MCG/5ML IJ SOLN
INTRAMUSCULAR | Status: DC | PRN
  Administered 2022-07-26: 100 ug via INTRAVENOUS
  Administered 2022-07-26: 50 ug via INTRAVENOUS

## 2022-07-26 MED ORDER — PHENYLEPHRINE HCL (PRESSORS) 10 MG/ML IV SOLN
INTRAVENOUS | Status: DC | PRN
  Administered 2022-07-26 (×2): 80 ug via INTRAVENOUS

## 2022-07-26 MED ORDER — ONDANSETRON HCL 4 MG/2ML IJ SOLN
INTRAMUSCULAR | Status: DC | PRN
  Administered 2022-07-26: 4 mg via INTRAVENOUS

## 2022-07-26 MED ORDER — SUGAMMADEX SODIUM 200 MG/2ML IV SOLN
INTRAVENOUS | Status: DC | PRN
  Administered 2022-07-26: 200 mg via INTRAVENOUS

## 2022-07-26 MED ORDER — LIDOCAINE HCL (PF) 1 % IJ SOLN
INTRAMUSCULAR | Status: AC
  Filled 2022-07-26: qty 30

## 2022-07-26 MED ORDER — HEPARIN SODIUM (PORCINE) 1000 UNIT/ML IJ SOLN
INTRAMUSCULAR | Status: AC
  Filled 2022-07-26: qty 10

## 2022-07-26 MED ORDER — TRAMADOL HCL 50 MG PO TABS: 50.0000 mg | ORAL_TABLET | Freq: Four times a day (QID) | ORAL | 0 refills | Status: DC | PRN

## 2022-07-26 MED ORDER — PROPOFOL 10 MG/ML IV BOLUS
INTRAVENOUS | Status: DC | PRN
  Administered 2022-07-26: 120 mg via INTRAVENOUS

## 2022-07-26 MED ORDER — LIDOCAINE HCL (CARDIAC) PF 100 MG/5ML IV SOSY
PREFILLED_SYRINGE | INTRAVENOUS | Status: DC | PRN
  Administered 2022-07-26: 100 mg via INTRATRACHEAL

## 2022-07-26 MED ORDER — CHLORHEXIDINE GLUCONATE 0.12 % MT SOLN: 15.0000 mL | Freq: Once | OROMUCOSAL | Status: DC

## 2022-07-26 SURGICAL SUPPLY — 69 items
ARMBAND PINK RESTRICT EXTREMIT (MISCELLANEOUS) ×3 IMPLANT
BAG COUNTER SPONGE SURGICOUNT (BAG) ×3 IMPLANT
BAG DECANTER FOR FLEXI CONT (MISCELLANEOUS) ×3 IMPLANT
BIOPATCH RED 1 DISK 7.0 (GAUZE/BANDAGES/DRESSINGS) ×3 IMPLANT
BLADE CLIPPER SURG (BLADE) ×3 IMPLANT
BNDG COHESIVE 4X5 TAN STRL LF (GAUZE/BANDAGES/DRESSINGS) ×1 IMPLANT
BNDG ELASTIC 4X5.8 VLCR STR LF (GAUZE/BANDAGES/DRESSINGS) ×3 IMPLANT
CANISTER SUCT 3000ML PPV (MISCELLANEOUS) ×3 IMPLANT
CATH PALINDROME-P 19CM W/VT (CATHETERS) IMPLANT
CATH PALINDROME-P 23CM W/VT (CATHETERS) ×1 IMPLANT
CATH PALINDROME-P 28CM W/VT (CATHETERS) IMPLANT
CATH STRAIGHT 5FR 65CM (CATHETERS) IMPLANT
CLIP LIGATING EXTRA MED SLVR (CLIP) ×3 IMPLANT
CLIP LIGATING EXTRA SM BLUE (MISCELLANEOUS) ×3 IMPLANT
CLIP VESOCCLUDE MED 6/CT (CLIP) ×3 IMPLANT
COVER PROBE W GEL 5X96 (DRAPES) ×3 IMPLANT
COVER SURGICAL LIGHT HANDLE (MISCELLANEOUS) ×3 IMPLANT
DERMABOND ADVANCED .7 DNX12 (GAUZE/BANDAGES/DRESSINGS) ×3 IMPLANT
DRAPE C-ARM 42X72 X-RAY (DRAPES) ×3 IMPLANT
DRAPE CHEST BREAST 15X10 FENES (DRAPES) ×3 IMPLANT
DRSG XEROFORM 1X8 (GAUZE/BANDAGES/DRESSINGS) ×1 IMPLANT
ELECT REM PT RETURN 9FT ADLT (ELECTROSURGICAL) ×3
ELECTRODE REM PT RTRN 9FT ADLT (ELECTROSURGICAL) ×3 IMPLANT
GAUZE 4X4 16PLY ~~LOC~~+RFID DBL (SPONGE) ×3 IMPLANT
GAUZE SPONGE 4X4 12PLY STRL (GAUZE/BANDAGES/DRESSINGS) ×1 IMPLANT
GAUZE SPONGE 4X4 16PLY XRAY LF (GAUZE/BANDAGES/DRESSINGS) ×1 IMPLANT
GLOVE BIOGEL PI IND STRL 8 (GLOVE) ×3 IMPLANT
GOWN STRL REUS W/ TWL LRG LVL3 (GOWN DISPOSABLE) ×6 IMPLANT
GOWN STRL REUS W/TWL 2XL LVL3 (GOWN DISPOSABLE) ×6 IMPLANT
GOWN STRL REUS W/TWL LRG LVL3 (GOWN DISPOSABLE) ×6
KIT BASIN OR (CUSTOM PROCEDURE TRAY) ×3 IMPLANT
KIT PALINDROME-P 55CM (CATHETERS) IMPLANT
KIT TURNOVER KIT B (KITS) ×3 IMPLANT
NDL 18GX1X1/2 (RX/OR ONLY) (NEEDLE) ×2 IMPLANT
NDL HYPO 25GX1X1/2 BEV (NEEDLE) ×2 IMPLANT
NEEDLE 18GX1X1/2 (RX/OR ONLY) (NEEDLE) ×3 IMPLANT
NEEDLE HYPO 25GX1X1/2 BEV (NEEDLE) ×3 IMPLANT
NS IRRIG 1000ML POUR BTL (IV SOLUTION) ×3 IMPLANT
PACK BASIC III (CUSTOM PROCEDURE TRAY) ×3
PACK CV ACCESS (CUSTOM PROCEDURE TRAY) ×3 IMPLANT
PACK SRG BSC III STRL LF ECLPS (CUSTOM PROCEDURE TRAY) ×3 IMPLANT
PAD ARMBOARD 7.5X6 YLW CONV (MISCELLANEOUS) ×6 IMPLANT
SET MICROPUNCTURE 5F STIFF (MISCELLANEOUS) ×1 IMPLANT
SLING ARM FOAM STRAP LRG (SOFTGOODS) IMPLANT
SLING ARM FOAM STRAP MED (SOFTGOODS) IMPLANT
SOAP 2 % CHG 4 OZ (WOUND CARE) ×3 IMPLANT
SPIKE FLUID TRANSFER (MISCELLANEOUS) ×3 IMPLANT
SUT ETHILON 3 0 PS 1 (SUTURE) ×3 IMPLANT
SUT MNCRL AB 4-0 PS2 18 (SUTURE) ×3 IMPLANT
SUT PROLENE 5 0 C 1 24 (SUTURE) ×1 IMPLANT
SUT PROLENE 6 0 BV (SUTURE) ×3 IMPLANT
SUT PROLENE 7 0 BV 1 (SUTURE) IMPLANT
SUT SILK 2 0 SH (SUTURE) ×3 IMPLANT
SUT SILK 3 0 SH CR/8 (SUTURE) ×3 IMPLANT
SUT VIC AB 3-0 SH 27 (SUTURE) ×9
SUT VIC AB 3-0 SH 27X BRD (SUTURE) ×4 IMPLANT
SUT VIC AB 3-0 SH 27XBRD (SUTURE) ×1 IMPLANT
SWAB COLLECTION DEVICE MRSA (MISCELLANEOUS) ×1 IMPLANT
SWAB CULTURE ESWAB REG 1ML (MISCELLANEOUS) ×1 IMPLANT
SYR 10ML LL (SYRINGE) ×3 IMPLANT
SYR 20ML LL LF (SYRINGE) ×6 IMPLANT
SYR 5ML LL (SYRINGE) ×3 IMPLANT
SYR CONTROL 10ML LL (SYRINGE) ×3 IMPLANT
TAPE CLOTH SOFT 2X10 (GAUZE/BANDAGES/DRESSINGS) ×1 IMPLANT
TOWEL GREEN STERILE (TOWEL DISPOSABLE) ×3 IMPLANT
TOWEL GREEN STERILE FF (TOWEL DISPOSABLE) ×3 IMPLANT
UNDERPAD 30X36 HEAVY ABSORB (UNDERPADS AND DIAPERS) ×3 IMPLANT
WATER STERILE IRR 1000ML POUR (IV SOLUTION) ×3 IMPLANT
WIRE AMPLATZ SS-J .035X180CM (WIRE) IMPLANT

## 2022-07-26 NOTE — Discharge Instructions (Signed)
   [facility]  Discharge Instructions  AV Fistula or Graft Surgery for Dialysis Access  Please refer to the following instructions for your post-procedure care. Your surgeon or physician assistant will discuss any changes with you.  Activity  You may drive the day following your surgery, if you are comfortable and no longer taking prescription pain medication. Resume full activity as the soreness in your incision resolves.  Bathing/Showering  You may shower after you go home. Keep your incision dry for 48 hours. Do not soak in a bathtub, hot tub, or swim until the incision heals completely. You may not shower if you have a hemodialysis catheter.  Incision Care  Clean your incision with mild soap and water after 48 hours. Pat the area dry with a clean towel. You do not need a bandage unless otherwise instructed. Do not apply any ointments or creams to your incision. You may have skin glue on your incision. Do not peel it off. It will come off on its own in about one week. Your arm may swell a bit after surgery. To reduce swelling use pillows to elevate your arm so it is above your heart. Your doctor will tell you if you need to lightly wrap your arm with an ACE bandage.  Diet  Resume your normal diet. There are not special food restrictions following this procedure. In order to heal from your surgery, it is CRITICAL to get adequate nutrition. Your body requires vitamins, minerals, and protein. Vegetables are the best source of vitamins and minerals. Vegetables also provide the perfect balance of protein. Processed food has little nutritional value, so try to avoid this.  Medications  Resume taking all of your medications. If your incision is causing pain, you may take over-the counter pain relievers such as acetaminophen (Tylenol). If you were prescribed a stronger pain medication, please be aware these medications can cause nausea and constipation. Prevent  nausea by taking the medication with a snack or meal. Avoid constipation by drinking plenty of fluids and eating foods with high amount of fiber, such as fruits, vegetables, and grains.  Do not take Tylenol if you are taking prescription pain medications.  Follow up Your surgeon may want to see you in the office following your access surgery. If so, this will be arranged at the time of your surgery.  Please call us immediately for any of the following conditions:  Increased pain, redness, drainage (pus) from your incision site Fever of 101 degrees or higher Severe or worsening pain at your incision site Hand pain or numbness.  Reduce your risk of vascular disease:  Stop smoking. If you would like help, call [facility] at 1-800-QUIT-NOW 9853413662) or [place] at [place] your cholesterol Maintain a desired weight Control your diabetes Keep your blood pressure down  Dialysis  It will take several weeks to several months for your new dialysis access to be ready for use. Your surgeon will determine when it is okay to use it. Your nephrologist will continue to direct your dialysis. You can continue to use your Permcath until your new access is ready for use.   07/26/2022 Jane [name] [id number] 21-Nov-1945  Surgeon(s): Broadus John, MD  Procedure(s): INSERTION OF TUNNELED DIALYSIS CATHETER LIGATION OF ARTERIOVENOUS  FISTULA    If you have any questions, please call the office at [phone].

## 2022-07-26 NOTE — Transfer of Care (Signed)
Immediate Anesthesia Transfer of Care Note  Patient: Jane [name]  Procedure(s) Performed: INSERTION OF TUNNELED DIALYSIS CATHETER (Left: Chest) LIGATION OF ARTERIOVENOUS  FISTULA (Right: Arm Upper)  Patient Location: PACU  Anesthesia Type:General  Level of Consciousness: awake, alert , and oriented  Airway & Oxygen Therapy: Patient Spontanous Breathing and Patient connected to face mask oxygen  Post-op Assessment: Report given to RN and Post -op Vital signs reviewed and stable  Post vital signs: Reviewed and stable  Last Vitals:  Vitals Value Taken Time  BP 117/34 07/26/22 1248  Temp    Pulse 66 07/26/22 1249  Resp 14 07/26/22 1249  SpO2 100 % 07/26/22 1249  Vitals shown include unvalidated device data.  Last Pain:  Vitals:   07/26/22 0808  PainSc: 7          Complications: No notable events documented.

## 2022-07-26 NOTE — ED Provider Notes (Signed)
[facility] EMERGENCY DEPARTMENT Provider Note   CSN: [id number] Arrival date & time: 07/26/22  0803     History  Chief complaint: Fistula bleeding  Jane [name] is a 77 y.o. female.  HPI   Patient has a history of end-stage renal disease on dialysis, hypertension, reflux, GI bleeding, atrial fibrillation who presents to the ED with complaints of bleeding from her fistula.  Patient had an ulcerated right arm arteriovenous fistula.  She ended up having surgery on December 15 for revision of the AV fistula.  Patient states she continues to have staples in place.  She has developed a small scab area and this morning she noted severe brisk bleeding from that area.  Patient states there was blood all over her carpet in house.  EMS was able to control the bleeding with a pressure dressing.  Patient denies any other complaints. . Home Medications Prior to Admission medications   Medication Sig Start Date End Date Taking? Authorizing Provider  aspirin EC 81 MG tablet Take 1 tablet (81 mg total) by mouth daily. Swallow whole. 02/07/22  Yes Kathyrn Drown D, NP  carvedilol (COREG) 12.5 MG tablet Take 1 tablet (12.5 mg total) by mouth 2 (two) times daily with a meal. 07/23/22  Yes [name], [name], MD  clopidogrel (PLAVIX) 75 MG tablet Take 1 tablet (75 mg total) by mouth daily. 03/19/22  Yes Eileen Stanford, PA-C  atorvastatin (LIPITOR) 10 MG tablet Take 1 tablet (10 mg total) by mouth daily. 05/30/22   Adrian Prows, MD  B Complex-C-Zn-Folic Acid (DIALYVITE 937 WITH ZINC) 0.8 MG TABS Take 1 tablet by mouth at bedtime. 07/19/20   [provider]  cinacalcet (SENSIPAR) 30 MG tablet Take 30 mg by mouth every Monday, Wednesday, and Friday with hemodialysis. Post dialysis 12/04/21 12/03/22  [provider]  diltiazem (CARDIZEM) 60 MG tablet Take 1 tablet (60 mg total) by mouth 3 (three) times daily as needed (Atrial fibrillation). 11/21/21   Adrian Prows, MD  ethyl chloride  spray Apply 1 application topically every Monday, Wednesday, and Friday with hemodialysis. 12/25/17   [provider]  famotidine (PEPCID) 40 MG tablet Take 1 tablet (40 mg total) by mouth 2 (two) times daily. 06/09/22   Adrian Prows, MD  lansoprazole (PREVACID) 30 MG capsule Take 1 capsule (30 mg total) by mouth 2 (two) times daily. 06/02/22   Noralyn Pick, NP  LINZESS 72 MCG capsule TAKE ONE CAPSULE BY MOUTH BEFORE BREAKFAST 05/30/22   Noralyn Pick, NP  Methoxy PEG-Epoetin Beta (MIRCERA IJ) Inject 1 Dose into the vein every Monday, Wednesday, and Friday with hemodialysis. 08/21/21 08/20/22  [provider]  sevelamer carbonate (RENVELA) 800 MG tablet Take 1,600 mg by mouth in the morning and at bedtime. With meals take 800 mg with snack 08/26/21   [provider]      Allergies    Latex, Penicillins, Sulfa antibiotics, Tape, and Reglan [metoclopramide]    Review of Systems   Review of Systems  Physical Exam Updated Vital Signs BP 132/63   Pulse (!) 56   Resp 16   Ht 1.651 m ('5\' 5"'$ )   Wt 90.7 kg   SpO2 99%   BMI 33.28 kg/m  Physical Exam Vitals and nursing note reviewed.  Constitutional:      General: She is not in acute distress.    Appearance: She is well-developed.  HENT:     Head: Normocephalic and atraumatic.     Right  Ear: External ear normal.     Left Ear: External ear normal.  Eyes:     General: No scleral icterus.       Right eye: No discharge.        Left eye: No discharge.     Conjunctiva/sclera: Conjunctivae normal.  Neck:     Trachea: No tracheal deviation.  Cardiovascular:     Rate and Rhythm: Normal rate.  Pulmonary:     Effort: Pulmonary effort is normal. No respiratory distress.     Breath sounds: No stridor.  Abdominal:     General: There is no distension.  Musculoskeletal:        General: No swelling or deformity.     Cervical back: Neck supple.     Comments: Pressure dressing removed from the right  forearm.  Staples were in place.  There is 1 area of separation of the skin margins where there is a central scab measuring approximately 3 to 4 mm in size.  Initially there was no bleeding but within a minute or 2 of pulling the pressure dressing off patient started having brisk arterial bleeding in a steady stream.  Pressure dressing reapplied and bleeding controlled  Skin:    General: Skin is warm and dry.     Findings: No rash.  Neurological:     Mental Status: She is alert. Mental status is at baseline.     Cranial Nerves: No dysarthria or facial asymmetry.     Motor: No seizure activity.     ED Results / Procedures / Treatments   Labs (all labs ordered are listed, but only abnormal results are displayed) Labs Reviewed  CBC  BASIC METABOLIC PANEL  I-STAT CHEM 8, ED  SAMPLE TO BLOOD BANK  TYPE AND SCREEN  Labs ordered but not resulted at time of admission  EKG None  Radiology No results found.  Procedures Procedures    Medications Ordered in ED Medications  chlorhexidine (PERIDEX) 0.12 % solution 15 mL (has no administration in time range)    Or  Oral care mouth rinse (has no administration in time range)  0.9 %  sodium chloride infusion ( Intravenous New Bag/Given 07/26/22 1102)  0.9 % irrigation (POUR BTL) (1,000 mLs Irrigation Given 07/26/22 1047)  heparin 6000 units / NS 500 mL irrigation (1 Application Irrigation Given 07/26/22 1052)  vancomycin (VANCOCIN) IVPB 1000 mg/200 mL premix (has no administration in time range)  vancomycin (VANCOCIN) 1-5 GM/200ML-% IVPB (has no administration in time range)  chlorhexidine (PERIDEX) 0.12 % solution (has no administration in time range)    ED Course/ Medical Decision Making/ A&P                           Medical Decision Making Patient appears to have recurrent bleeding at her AV fistula.  There does appear to be small skin ulceration.  Will consult vascular surgery.  Plan on laboratory testing.  Likely will require  operative revision  Problems Addressed: Bleeding from dialysis shunt, initial encounter [facility]): acute illness or injury that poses a threat to life or bodily functions  Amount and/or Complexity of Data Reviewed Labs: ordered. Discussion of management or test interpretation with external provider(s): Case discussed with Dr Unk Lightning.  He evaluated pt and will take her to the OR.  Case discussed with Dr Lorin Mercy, hospitalist.  She will discuss with Dr Unk Lightning regarding medical admission  Risk Decision regarding hospitalization.  Final Clinical Impression(s) / ED Diagnoses Final diagnoses:  Bleeding from dialysis shunt, initial encounter [facility])    Rx / DC Orders ED Discharge Orders     None         Dorie Rank, MD 07/26/22 1115

## 2022-07-26 NOTE — Anesthesia Postprocedure Evaluation (Signed)
Anesthesia Post Note  Patient: Jane [name]  Procedure(s) Performed: INSERTION OF TUNNELED DIALYSIS CATHETER (Left: Chest) LIGATION OF RIGHT RADIO-CEPHALIC ARTERIOVENOUS  FISTULA (Right: Arm Lower) REPAIR RIGHT RADIO-CEPHALIC FISTULA ANEURYSM (Right: Arm Lower)     Patient location during evaluation: PACU Anesthesia Type: General Level of consciousness: awake and alert Pain management: pain level controlled Vital Signs Assessment: post-procedure vital signs reviewed and stable Respiratory status: spontaneous breathing Cardiovascular status: stable Anesthetic complications: no   No notable events documented.  Last Vitals:  Vitals:   07/26/22 1305 07/26/22 1320  BP: (!) 120/39 (!) 122/43  Pulse: 70 63  Resp: (!) 6 12  Temp:  36.8 C  SpO2: 95% 100%    Last Pain:  Vitals:   07/26/22 1320  PainSc: 0-No pain                 Nolon Nations

## 2022-07-26 NOTE — Anesthesia Preprocedure Evaluation (Addendum)
Anesthesia Evaluation  Patient identified by MRN, date of birth, ID band Patient awake    Reviewed: Allergy & Precautions, NPO status , Patient's Chart, lab work & pertinent test results  History of Anesthesia Complications Negative for: history of anesthetic complications  Airway Mallampati: II  TM Distance: >3 FB Neck ROM: Full    Dental  (+) Dental Advisory Given, Teeth Intact   Pulmonary shortness of breath, asthma , sleep apnea    Pulmonary exam normal breath sounds clear to auscultation       Cardiovascular hypertension, Pt. on medications and Pt. on home beta blockers + dysrhythmias Atrial Fibrillation + Valvular Problems/Murmurs  Rhythm:Regular Rate:Normal + Systolic murmurs Echo 09/5454  1. Interventional TEE for LAA-O procedure.   2. Prior to the procedure, patent mixed windsock-broccolic LAA with maximal diameter 2.0 cm and maximal depth 3.0 cm.   3. Left atrial size was moderately dilated. No left atrial/left atrial appendage thrombus was detected.   4. A small PFO was present.   5. During procedure a mid-mid transeptal puncture performed without complication.   6. Attempt of 24 mm Watchman FLX device with small < 28m leak and ~ 9% compression, device recaptured.   7. Placement of a 27 mm Watchman FLX device. At last deployment no  inappropriate diplacement on tug and no peri-device leak. Average compression 21 % (2.13 cm)   8. A small pericardial effusion is present. The pericardial effusion is circumferential. Effusion unchanged before and after intervention.   9. Successfull placement of 27 mm Watchman FLX device.  10. Left ventricular ejection fraction, by estimation, is 55 to 60%. The left ventricle has normal function.  11. Right ventricular systolic function is normal. The right ventricular size is normal. There is severely elevated pulmonary artery systolic pressure. The estimated right ventricular systolic  pressure is 625.6mmHg using coronary sinus flow as a surrogate for IVC flow.  12. Right atrial size was moderately dilated.  13. The mitral valve is normal in structure. Mild mitral valve regurgitation. No evidence of mitral stenosis.  14. Tricuspid valve regurgitation is moderate. Function in origin between the septal and posterior leaflet. 3D VC 0.396 cm2.  15. Moderately dilated pulmonary artery.  16. The aortic valve is tricuspid. Aortic valve regurgitation is trivial. No aortic stenosis is present.  17. There is mild (Grade II) plaque involving the aortic root and  ascending aorta.     Neuro/Psych  Headaches PSYCHIATRIC DISORDERS  Depression       GI/Hepatic Neg liver ROS, PUD,GERD  Medicated,,  Endo/Other  negative endocrine ROS    Renal/GU ESRF and DialysisRenal diseaseLab Results      Component                Value               Date                      CREATININE               5.93 (H)            07/04/2022            HD 07/04/22     Musculoskeletal  (+) Arthritis ,    Abdominal  (+) + obese  Peds  Hematology  (+) Blood dyscrasia, anemia Lab Results      Component                Value  Date                      WBC                      5.8                 07/04/2022                HGB                      11.7 (L)            07/04/2022                HCT                      39.8                07/04/2022                MCV                      98.3                07/04/2022                PLT                      45 (L)              07/04/2022              Anesthesia Other Findings   Reproductive/Obstetrics                              Anesthesia Physical Anesthesia Plan  ASA: 3 and emergent  Anesthesia Plan: General   Post-op Pain Management: Ofirmev IV (intra-op)*   Induction: Intravenous, Rapid sequence and Cricoid pressure planned  PONV Risk Score and Plan: 3 and Ondansetron, Dexamethasone and Treatment may  vary due to age or medical condition  Airway Management Planned: Oral ETT  Additional Equipment: None  Intra-op Plan:   Post-operative Plan: Extubation in OR  Informed Consent: I have reviewed the patients History and Physical, chart, labs and discussed the procedure including the risks, benefits and alternatives for the proposed anesthesia with the patient or authorized representative who has indicated his/her understanding and acceptance.     Dental advisory given  Plan Discussed with: CRNA  Anesthesia Plan Comments:         Anesthesia Quick Evaluation

## 2022-07-26 NOTE — Op Note (Signed)
NAME: Jane [name]    MRN: [id number] DOB: 25-Feb-1946    DATE OF OPERATION: 07/26/2022  PREOP DIAGNOSIS:    Fistula hemorrhage  POSTOP DIAGNOSIS:    Same  PROCEDURE:    Right arm radiocephalic fistula ligation Fistula aneurysm excision Tunneled dialysis catheter placement-23 cm in the left internal jugular vein  SURGEON: Broadus John  ASSIST: Leontine Locket, PA  ANESTHESIA: General  EBL: 100 mL  INDICATIONS:    Jane [name] is a 77 y.o. female who presented to the ED by EMS due to spontaneous rupture of right sided radiocephalic fistula.  This fistula underwent revision roughly 1 month ago after another spontaneous bleed.  After discussing the risk and benefits of fistula ligation and tunneled dialysis catheter placement to prevent future spontaneous, life-threatening, bleeding events, Jane [name] elected to proceed.  FINDINGS:   3 cm defect in the radiocephalic fistula and to the area of previous plication.  TECHNIQUE:   Patient was brought to the OR laid in supine position.  General anesthesia was induced and the patient was prepped and draped in standard fashion.  Upon taking off the pressure dressing, hemorrhage was noted.  A tourniquet was placed and inflated for proper prepping and draping.  Next, a timeout was performed, the previous surgical incision was opened and extended both proximally and distally.  A vascular clamp was used for control of the proximal and distal portions of the fistula.  The tourniquet was released.  Bleeding was controlled.  Next, Doppler was brought onto the field and the radial artery and ulnar artery uncinated.  These sounded normal, therefore the proximal aspect of the fistula was ligated and oversewn using running 5-0 Prolene suture in double mattress fashion.  The distal portion of the fistula was ligated using 2, 2-0 silk ties.  The aneurysmal portion of the fistula which was the site of previous plication and rupture, was excised with the use  of cautery.  This was sent to microbiology to ensure there was no infectious component.  Next, Coban was used for manual pressure, and while getting hemostasis, I moved to the left neck for tunneled dialysis catheter placement.  Using ultrasound guidance the left internal jugular vein was accessed with micropuncture technique.  Through the micropuncture sheath a floppy J-wire was advanced into the superior vena cava, down into the inferior venacava.  A small incision was made around the skin access point.  A counterincision was made in the chest under the clavicle.  A 23 cm tunneled dialysis catheter was then tunneled under the skin, over the clavicle into the incision in the neck.  The access point was serially dilated under direct fluoroscopic guidance.  A peel-away sheath was introduced into the superior vena cava under fluoroscopic guidance.  The tunneling device was removed and the catheter fed through the peel-away sheath into the superior vena cava.  The peel-away sheath was removed and the catheter gently pulled back.  Adequate position was confirmed with x-ray.  The catheter was tested and found to flush and draw back well.  Catheter was heparin locked.  Caps were applied.  Catheter was sutured to the skin.  The neck incision was closed with subcutaneous 3-0 Vicryl suture with Dermabond at the level of skin.  Next, I moved back to the right arm.  The Coban was removed and wound bed irrigated with copious amounts of saline.  2-0 Vicryl suture was used to oppose the 2 sides, which was quite large defect due to  the aneurysmectomy.  Another layer of 3-0 Vicryl suture followed.  Staples were used at the level of the skin and a sterile dressing was placed.  Impression: Successful left radiocephalic fistula ligation, aneurysmectomy, left internal jugular vein 23 cm tunneled hemodialysis catheter placement.   Macie Burows, MD [facility] DATE OF DICTATION:    07/26/2022

## 2022-07-26 NOTE — Anesthesia Procedure Notes (Signed)
Procedure Name: Intubation Date/Time: 07/26/2022 11:22 AM  Performed by: Amadeo Garnet, CRNAPre-anesthesia Checklist: Patient identified, Emergency Drugs available, Suction available and Patient being monitored Patient Re-evaluated:Patient Re-evaluated prior to induction Oxygen Delivery Method: Circle system utilized Preoxygenation: Pre-oxygenation with 100% oxygen Induction Type: IV induction Ventilation: Mask ventilation without difficulty Laryngoscope Size: Mac and 4 Grade View: Grade I Tube type: Oral Tube size: 7.0 mm Number of attempts: 1 Airway Equipment and Method: Stylet and Oral airway Placement Confirmation: ETT inserted through vocal cords under direct vision, positive ETCO2 and breath sounds checked- equal and bilateral Secured at: 22 cm Tube secured with: Tape Dental Injury: Teeth and Oropharynx as per pre-operative assessment

## 2022-07-26 NOTE — Consult Note (Addendum)
Hospital Consult    Reason for Consult: Right upper extremity hemorrhage from fistula Requesting Physician: ED MRN #:  [id number]  History of Present Illness: This is a 77 y.o. female who presented status post spontaneous right radiocephalic fistula bleed at home.  EBL estimated 200 to 300 mL.  Patient has a history of recent right radiocephalic fistula revision last month.  Prior fistulogram demonstrated occlusion of previously placed cephalic stents.  The radiocephalic fistula drains through the basilic and deep systems.  On exam, Riven was doing well, not in distress.  Pressure dressing placed, no current bleeding. Denies sensorimotor deficits in the right arm, denied dizziness, lightheadedness.  Past Medical History:  Diagnosis Date   Acid reflux    Anemia of chronic disease    Arthritis    Asthma    Atrial fibrillation (HCC)    AVM (arteriovenous malformation)    Bilateral carotid bruits    Complication of anesthesia    "hard to wake up, I have sleep apnea" no CPAP   Diverticulitis    Duodenal ulcer    Dysrhythmia    Afib   ESRD (end stage renal disease) ([place])    MWF [place]   Gallbladder sludge    GI bleed    Headache    Heart murmur    Hemorrhoids    History of blood transfusion    Hypertension    Malaise and fatigue    Orthostatic hypotension    PAF (paroxysmal atrial fibrillation) ([place])    Presence of Watchman left atrial appendage closure device 02/06/2022   Watchman FLX 60m with Dr. CBurt Knack  Shortness of breath    " when I walk to fast"   Sleep apnea    Syncope    Tubulovillous adenoma of colon     Past Surgical History:  Procedure Laterality Date   A/V FISTULAGRAM Right 10/18/2020   Procedure: A/V FISTULAGRAM;  Surgeon: CMarty Heck MD;  [facility];  Service: Cardiovascular;  Laterality: Right;   A/V FISTULAGRAM Right 09/05/2021   Procedure: A/V Fistulagram;  Surgeon: CMarty Heck MD;  [facility];  Service: Cardiovascular;  Laterality: Right;   AV FISTULA PLACEMENT     BACK SURGERY     Lumbar fusion L 4 and L 5   BIOPSY  01/09/2020   Procedure: BIOPSY;  Surgeon: MRush LandmarkGTelford Nab, MD;  [place] [facility]  Service: Gastroenterology;;   BIOPSY  01/31/2021   Procedure: BIOPSY;  Surgeon: MIrving Copas, MD;  [place] [facility]  Service: Gastroenterology;;   BIOPSY  09/26/2021   Procedure: BIOPSY;  Surgeon: BThornton Park MD;  [place] [facility]  Service: Gastroenterology;;   COLONOSCOPY     COLONOSCOPY N/A 02/01/2021   Procedure: COLONOSCOPY;  Surgeon: AYetta Flock MD;  [place]: [facility]  Service: Gastroenterology;  Laterality: N/A;   COLONOSCOPY WITH PROPOFOL N/A 09/26/2021   Procedure: COLONOSCOPY WITH PROPOFOL;  Surgeon: BThornton Park MD;  [place] [facility]  Service: Gastroenterology;  Laterality: N/A;   COLONOSCOPY WITH PROPOFOL N/A 09/29/2021   Procedure: COLONOSCOPY WITH PROPOFOL;  Surgeon: BThornton Park MD;  Location: [facility]  Service: Gastroenterology;  Laterality: N/A;   COLONOSCOPY WITH PROPOFOL N/A 10/01/2021   Procedure: COLONOSCOPY WITH PROPOFOL;  Surgeon: NMauri Pole MD;  Location: [facility]  Service: Gastroenterology;  Laterality: N/A;   ENDOSCOPIC MUCOSAL RESECTION N/A 01/09/2020   Procedure: ENDOSCOPIC MUCOSAL RESECTION;  Surgeon: MRush LandmarkGTelford Nab, MD;  Location: [facility]  Service: Gastroenterology;  Laterality: N/A;   ENDOSCOPIC MUCOSAL RESECTION N/A 01/31/2021   Procedure: ENDOSCOPIC MUCOSAL RESECTION;  Surgeon: Rush Landmark Telford Nab., MD;  Location: [place];  Service: Gastroenterology;  Laterality: N/A;   ENTEROSCOPY N/A 09/26/2021   Procedure: ENTEROSCOPY;  Surgeon: Thornton Park, MD;  Location: [place];  Service: Gastroenterology;  Laterality: N/A;   ESOPHAGOGASTRODUODENOSCOPY     ESOPHAGOGASTRODUODENOSCOPY (EGD) WITH PROPOFOL N/A 01/09/2020    Procedure: ESOPHAGOGASTRODUODENOSCOPY (EGD) WITH PROPOFOL;  Surgeon: Rush Landmark Telford Nab., MD;  Location: [place];  Service: Gastroenterology;  Laterality: N/A;   ESOPHAGOGASTRODUODENOSCOPY (EGD) WITH PROPOFOL N/A 01/13/2020   Procedure: ESOPHAGOGASTRODUODENOSCOPY (EGD) WITH PROPOFOL;  Surgeon: Irene Shipper, MD;  Location: [place];  Service: Gastroenterology;  Laterality: N/A;   ESOPHAGOGASTRODUODENOSCOPY (EGD) WITH PROPOFOL N/A 01/31/2021   Procedure: ESOPHAGOGASTRODUODENOSCOPY (EGD) WITH PROPOFOL;  Surgeon: Rush Landmark Telford Nab., MD;  Location: [place];  Service: Gastroenterology;  Laterality: N/A;   EUS N/A 01/09/2020   Procedure: UPPER ENDOSCOPIC ULTRASOUND (EUS) RADIAL;  Surgeon: Irving Copas., MD;  Location: [place];  Service: Gastroenterology;  Laterality: N/A;   GIVENS CAPSULE STUDY  09/29/2021   Procedure: GIVENS CAPSULE STUDY;  Surgeon: Thornton Park, MD;  Location: [place];  Service: Gastroenterology;;   HEMOSTASIS CLIP PLACEMENT  01/09/2020   Procedure: HEMOSTASIS CLIP PLACEMENT;  Surgeon: Irving Copas., MD;  Location: [place];  Service: Gastroenterology;;   HEMOSTASIS CLIP PLACEMENT  01/31/2021   Procedure: HEMOSTASIS CLIP PLACEMENT;  Surgeon: Irving Copas., MD;  Location: [place];  Service: Gastroenterology;;   HEMOSTASIS CONTROL  01/13/2020   Procedure: HEMOSTASIS CONTROL;  Surgeon: Irene Shipper, MD;  Location: [place];  Service: Gastroenterology;;  hemaspray   HOT HEMOSTASIS N/A 01/13/2020   Procedure: HOT HEMOSTASIS (ARGON PLASMA COAGULATION/BICAP);  Surgeon: Irene Shipper, MD;  Location: [place];  Service: Gastroenterology;  Laterality: N/A;   HOT HEMOSTASIS N/A 02/01/2021   Procedure: HOT HEMOSTASIS (ARGON PLASMA COAGULATION/BICAP);  Surgeon: Yetta Flock, MD;  Location: [facility];  Service: Gastroenterology;  Laterality: N/A;   HOT HEMOSTASIS N/A 10/01/2021   Procedure: HOT HEMOSTASIS (ARGON  PLASMA COAGULATION/BICAP);  Surgeon: Mauri Pole, MD;  Location: [place];  Service: Gastroenterology;  Laterality: N/A;   IR GENERIC HISTORICAL  07/09/2016   IR US GUIDE VASC ACCESS RIGHT 07/09/2016 Arne Cleveland, MD [facility]   IR GENERIC HISTORICAL  07/09/2016   IR FLUORO GUIDE CV LINE RIGHT 07/09/2016 Arne Cleveland, MD [facility]   KNEE ARTHROPLASTY Left    LAPAROSCOPIC SIGMOID COLECTOMY N/A 07/11/2016   Procedure: LAPAROSCOPIC SIGMOID COLECTOMY;  Surgeon: Clovis Riley, MD;  Location: [place];  Service: General;  Laterality: N/A;   LEFT ATRIAL APPENDAGE OCCLUSION N/A 02/06/2022   Procedure: LEFT ATRIAL APPENDAGE OCCLUSION;  Surgeon: Sherren Mocha, MD;  Location: [place] [facility];  Service: Cardiovascular;  Laterality: N/A;   MASS EXCISION Right 04/10/2020   Procedure: EXCISION SKIN NODULE RIGHT FOREARM;  Surgeon: Waynetta Sandy, MD;  Location: [place];  Service: Vascular;  Laterality: Right;   POLYPECTOMY  09/26/2021   Procedure: POLYPECTOMY;  Surgeon: Thornton Park, MD;  Location: [place];  Service: Gastroenterology;;   POLYPECTOMY  10/01/2021   Procedure: POLYPECTOMY;  Surgeon: Mauri Pole, MD;  Location: [place];  Service: Gastroenterology;;   REVISON OF ARTERIOVENOUS FISTULA Right 07/04/2022   Procedure: REVISON OF RIGHT FOREARM ARTERIOVENOUS FISTULA;  Surgeon: Cherre Julie Nay, MD;  Location: [place];  Service: Vascular;  Laterality: Right;   SCLEROTHERAPY  01/09/2020  Procedure: SCLEROTHERAPY;  Surgeon: Rush Landmark Telford Nab., MD;  Location: [place];  Service: Gastroenterology;;   Clide Deutscher  01/13/2020   Procedure: Clide Deutscher;  Surgeon: Irene Shipper, MD;  Location: [place];  Service: Gastroenterology;;   Maryagnes Amos INJECTION  01/09/2020   Procedure: SUBMUCOSAL LIFTING INJECTION;  Surgeon: Irving Copas., MD;  Location: [place];  Service: Gastroenterology;;   TEE WITHOUT CARDIOVERSION N/A  02/06/2022   Procedure: TRANSESOPHAGEAL ECHOCARDIOGRAM (TEE);  Surgeon: Sherren Mocha, MD;  Location: [place] [facility];  Service: Cardiovascular;  Laterality: N/A;   TUBAL LIGATION      Allergies  Allergen Reactions   Latex Rash   Penicillins Other (See Comments)    Yeast infection / Childhood   Sulfa Antibiotics Rash   Tape Other (See Comments)    Plastic, silicone, and paper tape causes bruising and pulls off skin. Cloth tape works fine   Reglan [Metoclopramide] Itching    Itchiness, bug crawling sensation     Prior to Admission medications   Medication Sig Start Date End Date Taking? Authorizing Provider  aspirin EC 81 MG tablet Take 1 tablet (81 mg total) by mouth daily. Swallow whole. 02/07/22   Tommie Raymond, NP  atorvastatin (LIPITOR) 10 MG tablet Take 1 tablet (10 mg total) by mouth daily. 05/30/22   Adrian Prows, MD  B Complex-C-Zn-Folic Acid (DIALYVITE 962 WITH ZINC) 0.8 MG TABS Take 1 tablet by mouth at bedtime. 07/19/20   [provider]  carvedilol (COREG) 12.5 MG tablet Take 1 tablet (12.5 mg total) by mouth 2 (two) times daily with a meal. 07/23/22   [name], [name], MD  cinacalcet [facility]) 30 MG tablet Take 30 mg by mouth every Monday, Wednesday, and Friday with hemodialysis. Post dialysis 12/04/21 12/03/22  [provider]  clopidogrel (PLAVIX) 75 MG tablet Take 1 tablet (75 mg total) by mouth daily. 03/19/22   Eileen Stanford, PA-C  diltiazem (CARDIZEM) 60 MG tablet Take 1 tablet (60 mg total) by mouth 3 (three) times daily as needed (Atrial fibrillation). 11/21/21   Adrian Prows, MD  ethyl chloride spray Apply 1 application topically every Monday, Wednesday, and Friday with hemodialysis. 12/25/17   [provider]  famotidine (PEPCID) 40 MG tablet Take 1 tablet (40 mg total) by mouth 2 (two) times daily. 06/09/22   Adrian Prows, MD  lansoprazole (PREVACID) 30 MG capsule Take 1 capsule (30 mg total) by mouth 2 (two) times daily. 06/02/22    Noralyn Pick, NP  LINZESS 72 MCG capsule TAKE ONE CAPSULE BY MOUTH BEFORE BREAKFAST 05/30/22   Noralyn Pick, NP  Methoxy PEG-Epoetin Beta (MIRCERA IJ) Inject 1 Dose into the vein every Monday, Wednesday, and Friday with hemodialysis. 08/21/21 08/20/22  [provider]  sevelamer carbonate (RENVELA) 800 MG tablet Take 1,600 mg by mouth in the morning and at bedtime. With meals take 800 mg with snack 08/26/21   [provider]    Social History   Socioeconomic History   Marital status: Divorced    Spouse name: Not on file   Number of children: 4   Years of education: 14   Highest education level: Associate degree: occupational, Hotel manager, or vocational program  Occupational History   Occupation: Retired  Tobacco Use   Smoking status: Never    Passive exposure: Never   Smokeless tobacco: Never  Vaping Use   Vaping Use: Never used  Substance and Sexual Activity   Alcohol use: No   Drug use: No  Sexual activity: Never  Other Topics Concern   Not on file  Social History Narrative   HH 1   Divorced   Outpatient dialysis Mon, Wed, Fri   4 children: 1 daughter locally is an Designer, multimedia and 3 sons in [place] Strain: High Risk (07/09/2022)   Overall Financial Resource Strain (CARDIA)    Difficulty of Paying Living Expenses: Hard  Food Insecurity: Food Insecurity Present (08/21/2021)   Hunger Vital Sign    Worried About Running Out of Food in the Last Year: Often true    Ran Out of Food in the Last Year: Often true  Transportation Needs: Unmet Transportation Needs (08/21/2021)   PRAPARE - Hydrologist (Medical): Yes    Lack of Transportation (Non-Medical): No  Physical Activity: Inactive (08/06/2021)   Exercise Vital Sign    Days of Exercise per Week: 0 days    Minutes of Exercise per Session: 0 min  Stress: Stress Concern Present (08/21/2021)   [place]    Feeling of Stress : Very much  Social Connections: Moderately Isolated (07/31/2020)   Social Connection and Isolation Panel [NHANES]    Frequency of Communication with Friends and Family: More than three times a week    Frequency of Social Gatherings with Friends and Family: Never    Attends Religious Services: More than 4 times per year    Active Member of [employer] or Organizations: No    Attends Archivist Meetings: Never    Marital Status: Divorced  Human resources officer Violence: Not At Risk (07/31/2020)   Humiliation, Afraid, Rape, and Kick questionnaire    Fear of Current or Ex-Partner: No    Emotionally Abused: No    Physically Abused: No    Sexually Abused: No   Family History  Problem Relation Age of Onset   Heart failure Mother    Stroke Mother    Other Father    Colon cancer Neg Hx    Liver disease Neg Hx    Esophageal cancer Neg Hx    Stomach cancer Neg Hx    Inflammatory bowel disease Neg Hx    Rectal cancer Neg Hx    Pancreatic cancer Neg Hx     ROS: Otherwise negative unless mentioned in HPI  Physical Examination  Vitals:   07/26/22 0808 07/26/22 0845  BP: (!) 176/68 132/63  Pulse: 75 (!) 56  Resp:  16  SpO2: 100% 99%   Body mass index is 33.28 kg/m.  General:  WDWN in NAD Gait: Not observed HENT: WNL, normocephalic Pulmonary: normal non-labored breathing, without Rales, rhonchi,  wheezing Cardiac: regula Abdomen: soft, NT/ND, no masses Skin: without rashes Vascular Exam/Pulses: Nonpalpable pulses in the right hand at the wrist, no thrill in the fistula as it was wrapped with pressure dressing. Extremities: without ischemic changes, without Gangrene , without cellulitis; without open wounds;  Musculoskeletal: no muscle wasting or atrophy  Neurologic: A&O X 3;  No focal weakness or paresthesias are detected; speech is fluent/normal Psychiatric:  The pt has Normal affect. Lymph:   Unremarkable  CBC    Component Value Date/Time   WBC 5.8 07/04/2022 1958   RBC 4.05 07/04/2022 1958   HGB 11.7 (L) 07/04/2022 1958   HGB 12.0 02/03/2022 1236   HCT 39.8 07/04/2022 1958   HCT 37.0 02/03/2022 1236   PLT 45 (L) 07/04/2022 1958  PLT 269 02/03/2022 1236   MCV 98.3 07/04/2022 1958   MCV 87 02/03/2022 1236   MCH 28.9 07/04/2022 1958   MCHC 29.4 (L) 07/04/2022 1958   RDW 17.9 (H) 07/04/2022 1958   RDW 17.8 (H) 02/03/2022 1236   LYMPHSABS 0.7 07/04/2022 1958   MONOABS 0.6 07/04/2022 1958   EOSABS 0.4 07/04/2022 1958   BASOSABS 0.0 07/04/2022 1958    BMET    Component Value Date/Time   NA 139 07/04/2022 1958   NA 140 03/19/2022 1621   K 4.5 07/04/2022 1958   CL 101 07/04/2022 1958   CO2 27 07/04/2022 1958   GLUCOSE 96 07/04/2022 1958   BUN 10 07/04/2022 1958   BUN 23 03/19/2022 1621   CREATININE 5.93 (H) 07/04/2022 1958   CALCIUM 8.7 (L) 07/04/2022 1958   GFRNONAA 7 (L) 07/04/2022 1958   GFRAA 4 (L) 01/18/2020 0714    COAGS: Lab Results  Component Value Date   INR 1.2 07/04/2022   INR 2.9 03/06/2022   INR 2.7 02/11/2022      ASSESSMENT/PLAN: This is a 77 y.o. female who presented to [facility] with spontaneous left radiocephalic fistula bleed.  This was recently revised last month due to a previous bleed.  Currently, she has a pressure dressing in place, no active bleeding.  I had a long conversation with Nijae regarding revision versus ligation and tunneled dialysis catheter placement.  Being that the fistula has already been revised, with recurrent bleed, I do not think it is safe to perform another revision. Ch[place] mentioned that she is unable to sleep due to being worried about spontaneous bleeding.  After discussing the risks and benefits of right arm radiocephalic fistula ligation, and tunneled dialysis catheter placement, although elected to proceed.   My plan is to let her recover from this prior to pursuing new access, which will take  place in the outpatient setting.  Plan for OR today.  N.p.o. Appreciate hospital medicine admission, dialysis days Monday Wednesday Friday.   Cassandria Santee MD [facility] [phone] 07/26/2022  9:53 AM

## 2022-07-26 NOTE — Progress Notes (Signed)
Some concern for infection right arm fistula although no overt signs of infection intraoperatively.  Pt has allergy to PCN.  She received test dose for cephalosporin and received ancef for surgical prophylaxis without issues.    Augmentin 500/125 po bid x 5 days and tramadol '50mg'$  q6h prn pain #15 (fifteen) no refill given.  Pt to f/u in our office in 2 weeks for staple removal.  Will discuss proceeding with new access at that time once incisions have healed.   Leontine Locket, [facility] 07/26/2022 12:18 PM

## 2022-07-26 NOTE — Progress Notes (Signed)
Patient with h/o ESRD on MWF, HTN, afib presenting with fistula hemorrhage.  [facility] was asked to admit but based on the clinical stability of the patient at this time in conjunction with her appropriate labs, I reached out to Dr. Virl Cagey about the possibility of dc to home post-procedure.  This appears to be appropriate for now, with plan for resumption of outpatient HD on Monday as scheduled.  If issues arise peri-operatively, [facility] is happy to consult for admission at that time.   Carlyon Shadow, M.D.

## 2022-07-26 NOTE — ED Notes (Addendum)
Right arm restriction, attempted AC IV on patient's left arm with no success. Pt states IV team consult is usually necessary due to fistulas in both arms.

## 2022-07-26 NOTE — ED Triage Notes (Signed)
Pt BIB EMS from home after her fistula started bleeding this morning. Was here a few weeks ago for the same issue, fistula was revived and had staples put in. Supposed to get the staples out next week. Aox4, no LOC or dizziness. EMS said about 200-300 mL of blood on the floor. Bleeding controlled at this time with bandage wrap.

## 2022-07-27 ENCOUNTER — Encounter (HOSPITAL_COMMUNITY): Payer: Self-pay | Admitting: Vascular Surgery

## 2022-07-28 DIAGNOSIS — N186 End stage renal disease: Secondary | ICD-10-CM | POA: Diagnosis not present

## 2022-07-28 DIAGNOSIS — D631 Anemia in chronic kidney disease: Secondary | ICD-10-CM | POA: Diagnosis not present

## 2022-07-28 DIAGNOSIS — D509 Iron deficiency anemia, unspecified: Secondary | ICD-10-CM | POA: Diagnosis not present

## 2022-07-28 DIAGNOSIS — Z992 Dependence on renal dialysis: Secondary | ICD-10-CM | POA: Diagnosis not present

## 2022-07-28 DIAGNOSIS — N2581 Secondary hyperparathyroidism of renal origin: Secondary | ICD-10-CM | POA: Diagnosis not present

## 2022-07-30 ENCOUNTER — Other Ambulatory Visit: Payer: Self-pay | Admitting: Vascular Surgery

## 2022-07-30 DIAGNOSIS — E1129 Type 2 diabetes mellitus with other diabetic kidney complication: Secondary | ICD-10-CM | POA: Diagnosis not present

## 2022-07-30 DIAGNOSIS — D631 Anemia in chronic kidney disease: Secondary | ICD-10-CM | POA: Diagnosis not present

## 2022-07-30 DIAGNOSIS — Z992 Dependence on renal dialysis: Secondary | ICD-10-CM | POA: Diagnosis not present

## 2022-07-30 DIAGNOSIS — N186 End stage renal disease: Secondary | ICD-10-CM | POA: Diagnosis not present

## 2022-07-30 DIAGNOSIS — I4891 Unspecified atrial fibrillation: Secondary | ICD-10-CM | POA: Diagnosis not present

## 2022-07-30 DIAGNOSIS — D509 Iron deficiency anemia, unspecified: Secondary | ICD-10-CM | POA: Diagnosis not present

## 2022-07-30 DIAGNOSIS — N2581 Secondary hyperparathyroidism of renal origin: Secondary | ICD-10-CM | POA: Diagnosis not present

## 2022-07-30 MED ORDER — CIPROFLOXACIN HCL 500 MG PO TABS: 500.0000 mg | ORAL_TABLET | Freq: Two times a day (BID) | ORAL | 0 refills | Status: AC

## 2022-07-30 MED ORDER — CIPROFLOXACIN HCL 500 MG PO TABS: 500.0000 mg | ORAL_TABLET | Freq: Two times a day (BID) | ORAL | 0 refills | Status: DC

## 2022-07-30 NOTE — Progress Notes (Signed)
Pt abx changed for cover recent wound culture sensitivities.  Cipro 7 days BID. Pt was called and updated.   Broadus John MD

## 2022-07-30 NOTE — Addendum Note (Signed)
Addended by: Orlie Pollen E on: 07/30/2022 03:25 PM   Modules accepted: Orders

## 2022-07-31 ENCOUNTER — Telehealth: Payer: Self-pay

## 2022-07-31 LAB — AEROBIC/ANAEROBIC CULTURE W GRAM STAIN (SURGICAL/DEEP WOUND): Gram Stain: NONE SEEN

## 2022-07-31 NOTE — Telephone Encounter (Signed)
Pt called stating that her pharmacy, [facility], does not have the antibiotic that she was recently prescribed.  Reviewed pt's chart, returned call for clarification, two identifiers used. Pt states that the pharmacy claims to have never received the script. Script in chart. Informed pt it would be called in verbally. Confirmed understanding.  Called [facility] and gave verbal order for Cipro.

## 2022-08-01 ENCOUNTER — Other Ambulatory Visit: Payer: Self-pay | Admitting: Family Medicine

## 2022-08-01 ENCOUNTER — Telehealth: Payer: Self-pay | Admitting: Family Medicine

## 2022-08-01 DIAGNOSIS — N186 End stage renal disease: Secondary | ICD-10-CM | POA: Diagnosis not present

## 2022-08-01 DIAGNOSIS — D631 Anemia in chronic kidney disease: Secondary | ICD-10-CM | POA: Diagnosis not present

## 2022-08-01 DIAGNOSIS — D509 Iron deficiency anemia, unspecified: Secondary | ICD-10-CM | POA: Diagnosis not present

## 2022-08-01 DIAGNOSIS — B370 Candidal stomatitis: Secondary | ICD-10-CM

## 2022-08-01 DIAGNOSIS — Z992 Dependence on renal dialysis: Secondary | ICD-10-CM | POA: Diagnosis not present

## 2022-08-01 DIAGNOSIS — N2581 Secondary hyperparathyroidism of renal origin: Secondary | ICD-10-CM | POA: Diagnosis not present

## 2022-08-01 MED ORDER — NYSTATIN 100000 UNIT/ML MT SUSP: 5.0000 mL | Freq: Four times a day (QID) | OROMUCOSAL | 0 refills | Status: DC

## 2022-08-01 NOTE — Telephone Encounter (Addendum)
Pt called to say she has thrush in her mouth and a yeast infection and would like MD to send her "the same thing she sent last time" to: [facility] - [place], [place] - [address] Phone: [phone]  Fax: [phone]     Pt could not remember the name of the Rx.  LOV:  07/23/2022

## 2022-08-01 NOTE — Telephone Encounter (Signed)
Prescription for nystatin solution sent to her pharmacy for oral thrush. In regard to vaginal yeast infection, I do not recommend oral medication.  Recommend OTC Monistat. F/U as needed. Thanks, BJ

## 2022-08-01 NOTE — Telephone Encounter (Signed)
I left patient a voicemail letting her know medication for oral thrush was called in and to get OTC monistat for yeast infection & to call back with any questions.

## 2022-08-04 DIAGNOSIS — Z992 Dependence on renal dialysis: Secondary | ICD-10-CM | POA: Diagnosis not present

## 2022-08-04 DIAGNOSIS — N2581 Secondary hyperparathyroidism of renal origin: Secondary | ICD-10-CM | POA: Diagnosis not present

## 2022-08-04 DIAGNOSIS — N186 End stage renal disease: Secondary | ICD-10-CM | POA: Diagnosis not present

## 2022-08-04 DIAGNOSIS — D631 Anemia in chronic kidney disease: Secondary | ICD-10-CM | POA: Diagnosis not present

## 2022-08-04 DIAGNOSIS — D509 Iron deficiency anemia, unspecified: Secondary | ICD-10-CM | POA: Diagnosis not present

## 2022-08-04 LAB — POCT I-STAT, CHEM 8
BUN: 34 mg/dL — ABNORMAL HIGH (ref 8–23)
Calcium, Ion: 1.1 mmol/L — ABNORMAL LOW (ref 1.15–1.40)
Chloride: 97 mmol/L — ABNORMAL LOW (ref 98–111)
Creatinine, Ser: 9.9 mg/dL — ABNORMAL HIGH (ref 0.44–1.00)
Glucose, Bld: 84 mg/dL (ref 70–99)
HCT: 39 % (ref 36.0–46.0)
Hemoglobin: 13.3 g/dL (ref 12.0–15.0)
Potassium: 4.7 mmol/L (ref 3.5–5.1)
Sodium: 140 mmol/L (ref 135–145)
TCO2: 27 mmol/L (ref 22–32)

## 2022-08-04 NOTE — Progress Notes (Deleted)
ACUTE VISIT No chief complaint on file.  HPI: [name] is a 77 y.o. female, who is here today complaining of *** HPI  Review of Systems See other pertinent positives and negatives in HPI.  Current Outpatient Medications on File Prior to Visit  Medication Sig Dispense Refill   aspirin EC 81 MG tablet Take 1 tablet (81 mg total) by mouth daily. Swallow whole. 30 tablet 12   atorvastatin (LIPITOR) 10 MG tablet Take 1 tablet (10 mg total) by mouth daily. 90 tablet 1   B Complex-C-Zn-Folic Acid (DIALYVITE Q000111Q WITH ZINC) 0.8 MG TABS Take 1 tablet by mouth at bedtime.     carvedilol (COREG) 12.5 MG tablet Take 1 tablet (12.5 mg total) by mouth 2 (two) times daily with a meal. 180 tablet 1   cinacalcet (SENSIPAR) 30 MG tablet Take 30 mg by mouth every Monday, Wednesday, and Friday with hemodialysis. Post dialysis     ciprofloxacin (CIPRO) 500 MG tablet Take 1 tablet (500 mg total) by mouth 2 (two) times daily for 7 days. 14 tablet 0   clopidogrel (PLAVIX) 75 MG tablet Take 1 tablet (75 mg total) by mouth daily. 90 tablet 1   diltiazem (CARDIZEM) 60 MG tablet Take 1 tablet (60 mg total) by mouth 3 (three) times daily as needed (Atrial fibrillation). 30 tablet 1   ethyl chloride spray Apply 1 application topically every Monday, Wednesday, and Friday with hemodialysis.  12   famotidine (PEPCID) 40 MG tablet Take 1 tablet (40 mg total) by mouth 2 (two) times daily. 180 tablet 1   lansoprazole (PREVACID) 30 MG capsule Take 1 capsule (30 mg total) by mouth 2 (two) times daily. 60 capsule 2   LINZESS 72 MCG capsule TAKE ONE CAPSULE BY MOUTH BEFORE BREAKFAST 30 capsule 2   Methoxy PEG-Epoetin Beta (MIRCERA IJ) Inject 1 Dose into the vein every Monday, Wednesday, and Friday with hemodialysis.     nystatin (MYCOSTATIN) 100000 UNIT/ML suspension Take 5 mLs (500,000 Units total) by mouth 4 (four) times daily for 14 days. 473 mL 0   sevelamer carbonate (RENVELA) 800 MG tablet Take 1,600 mg by mouth in  the morning and at bedtime. With meals take 800 mg with snack     traMADol (ULTRAM) 50 MG tablet Take 1 tablet (50 mg total) by mouth every 6 (six) hours as needed. 15 tablet 0   Current Facility-Administered Medications on File Prior to Visit  Medication Dose Route Frequency Provider Last Rate Last Admin   betamethasone acetate-betamethasone sodium phosphate (CELESTONE) injection 12 mg  12 mg Intramuscular Once Kirsteins, Luanna Salk, MD       lidocaine (XYLOCAINE) 1 % (with pres) injection 4 mL  4 mL Other Once Kirsteins, Luanna Salk, MD        Past Medical History:  Diagnosis Date   Acid reflux    Anemia of chronic disease    Arthritis    Asthma    AVM (arteriovenous malformation)    Bilateral carotid bruits    Diverticulitis    Duodenal ulcer    ESRD (end stage renal disease) ([place])    MWF [place]   Hemorrhoids    History of blood transfusion    Hypertension    Malaise and fatigue    Orthostatic hypotension    PAF (paroxysmal atrial fibrillation) ([place])    Presence of Watchman left atrial appendage closure device 02/06/2022   Watchman FLX 42m with Dr. CBurt Knack  Sleep apnea  Syncope    Tubulovillous adenoma of colon    Allergies  Allergen Reactions   Latex Rash   Penicillins Other (See Comments)    Yeast infection / Childhood   Sulfa Antibiotics Rash   Tape Other (See Comments)    Plastic, silicone, and paper tape causes bruising and pulls off skin. Cloth tape works fine   Reglan [Metoclopramide] Itching    Itchiness, bug crawling sensation     Social History   Socioeconomic History   Marital status: Divorced    Spouse name: Not on file   Number of children: 4   Years of education: 14   Highest education level: Associate degree: occupational, Hotel manager, or vocational program  Occupational History   Occupation: Retired  Tobacco Use   Smoking status: Never    Passive exposure: Never   Smokeless tobacco: Never  Vaping Use   Vaping Use: Never used   Substance and Sexual Activity   Alcohol use: No   Drug use: No   Sexual activity: Never  Other Topics Concern   Not on file  Social History Narrative   HH 1   Divorced   Outpatient dialysis Mon, Wed, Fri   4 children: 1 daughter locally is an Designer, multimedia and 3 sons in [place] Determinants of Health   Financial Resource Strain: High Risk (07/09/2022)   Overall Financial Resource Strain (CARDIA)    Difficulty of Paying Living Expenses: Hard  Food Insecurity: Food Insecurity Present (08/21/2021)   Hunger Vital Sign    Worried About [place] in the Last Year: Often true    Ran Out of Food in the Last Year: Often true  Transportation Needs: Unmet Transportation Needs (08/21/2021)   PRAPARE - Hydrologist (Medical): Yes    Lack of Transportation (Non-Medical): No  Physical Activity: Inactive (08/06/2021)   Exercise Vital Sign    Days of Exercise per Week: 0 days    Minutes of Exercise per Session: 0 min  Stress: Stress Concern Present (08/21/2021)   [place]    Feeling of Stress : Very much  Social Connections: Moderately Isolated (07/31/2020)   Social Connection and Isolation Panel [NHANES]    Frequency of Communication with Friends and Family: More than three times a week    Frequency of Social Gatherings with Friends and Family: Never    Attends Religious Services: More than 4 times per year    Active Member of [employer] or Organizations: No    Attends Archivist Meetings: Never    Marital Status: Divorced    There were no vitals filed for this visit. There is no height or weight on file to calculate BMI.  Physical Exam  ASSESSMENT AND PLAN: There are no diagnoses linked to this encounter.  No follow-ups on file.  [name]. [name], MD  [facility]. [place] office.  Discharge Instructions   None

## 2022-08-05 ENCOUNTER — Other Ambulatory Visit (HOSPITAL_COMMUNITY): Payer: Medicare Other

## 2022-08-05 ENCOUNTER — Ambulatory Visit: Payer: Medicare Other | Admitting: Family Medicine

## 2022-08-05 ENCOUNTER — Encounter (HOSPITAL_COMMUNITY): Payer: Medicare Other

## 2022-08-05 NOTE — Progress Notes (Signed)
POST OPERATIVE OFFICE NOTE    CC:  F/u for surgery  HPI:  This is a 77 y.o. female who is s/p right arm RC AVF ligation, aneurysm excision and TDC placement left IJ on 07/26/2022 by Dr. Virl Cagey.   There were some concerns for infection in the right arm AVF although no overt signs of infection intraoperatively.  Pt has allergy to PCN.  She received test dose for cephalosporin and received ancef for surgical prophylaxis without issues.    She had a right RC AVF created in [place].  She tells me that she has also had a left arm access in the past that was infiltrated also in [place].     Augmentin 500/125 po bid x 5 days and tramadol '50mg'$  q6h prn pain #15 (fifteen) no refill was given.  She subsequently called and spoke with Dr. Virl Cagey and this was changed to Cipro.  She did develop thrush and is getting magic mouthwash.     Pt to f/u in our office in 2 weeks for staple removal.  She comes in today for follow up and her daughter by telephone.  Will discuss proceeding with new access at that time once incisions have healed.   Pt states she does not have pain/numbness in the right hand but does have some numbness from the wrist to the distal portion of the incision.     Allergies  Allergen Reactions   Latex Rash   Penicillins Other (See Comments)    Yeast infection / Childhood   Sulfa Antibiotics Rash   Tape Other (See Comments)    Plastic, silicone, and paper tape causes bruising and pulls off skin. Cloth tape works fine   Reglan [Metoclopramide] Itching    Itchiness, bug crawling sensation     Current Outpatient Medications  Medication Sig Dispense Refill   aspirin EC 81 MG tablet Take 1 tablet (81 mg total) by mouth daily. Swallow whole. 30 tablet 12   atorvastatin (LIPITOR) 10 MG tablet Take 1 tablet (10 mg total) by mouth daily. 90 tablet 1   B Complex-C-Zn-Folic Acid (DIALYVITE 161 WITH ZINC) 0.8 MG TABS Take 1 tablet by mouth at bedtime.     carvedilol (COREG) 12.5 MG tablet  Take 1 tablet (12.5 mg total) by mouth 2 (two) times daily with a meal. 180 tablet 1   cinacalcet (SENSIPAR) 30 MG tablet Take 30 mg by mouth every Monday, Wednesday, and Friday with hemodialysis. Post dialysis     ciprofloxacin (CIPRO) 500 MG tablet Take 1 tablet (500 mg total) by mouth 2 (two) times daily for 7 days. 14 tablet 0   clopidogrel (PLAVIX) 75 MG tablet Take 1 tablet (75 mg total) by mouth daily. 90 tablet 1   diltiazem (CARDIZEM) 60 MG tablet Take 1 tablet (60 mg total) by mouth 3 (three) times daily as needed (Atrial fibrillation). 30 tablet 1   ethyl chloride spray Apply 1 application topically every Monday, Wednesday, and Friday with hemodialysis.  12   famotidine (PEPCID) 40 MG tablet Take 1 tablet (40 mg total) by mouth 2 (two) times daily. 180 tablet 1   lansoprazole (PREVACID) 30 MG capsule Take 1 capsule (30 mg total) by mouth 2 (two) times daily. 60 capsule 2   LINZESS 72 MCG capsule TAKE ONE CAPSULE BY MOUTH BEFORE BREAKFAST 30 capsule 2   Methoxy PEG-Epoetin Beta (MIRCERA IJ) Inject 1 Dose into the vein every Monday, Wednesday, and Friday with hemodialysis.     nystatin (MYCOSTATIN) 100000 UNIT/ML suspension  Take 5 mLs (500,000 Units total) by mouth 4 (four) times daily for 14 days. 473 mL 0   sevelamer carbonate (RENVELA) 800 MG tablet Take 1,600 mg by mouth in the morning and at bedtime. With meals take 800 mg with snack     traMADol (ULTRAM) 50 MG tablet Take 1 tablet (50 mg total) by mouth every 6 (six) hours as needed. 15 tablet 0   Current Facility-Administered Medications  Medication Dose Route Frequency Provider Last Rate Last Admin   betamethasone acetate-betamethasone sodium phosphate (CELESTONE) injection 12 mg  12 mg Intramuscular Once Kirsteins, Luanna Salk, MD       lidocaine (XYLOCAINE) 1 % (with pres) injection 4 mL  4 mL Other Once Kirsteins, Luanna Salk, MD         ROS:  See HPI  Physical Exam:  Today's Vitals   08/08/22 1326  BP: (!) 199/82  Pulse: 61   Resp: 20  Temp: 97.7 F (36.5 C)  TempSrc: Temporal  SpO2: 100%  Weight: 196 lb (88.9 kg)  Height: '5\' 5"'$  (1.651 m)  PainSc: 7   PainLoc: Arm   Body mass index is 32.62 kg/m.   Incision:          Assessment/Plan:  This is a 77 y.o. female who is s/p: right arm RC AVF ligation, aneurysm excision and TDC placement left IJ on 07/26/2022 by Dr. Virl Cagey.  Pt seen with Dr. Virl Cagey.  -the pt does not have evidence of steal.  She does have some numbness between the wrist and the distal portion of the incision.  This is not unexpected. -the distal staples were left in place and the proximal staples were removed.   -the pt will follow up 2 weeks to have the remaining staples removed and have BUE vein mapping and arterial duplex to determine next access.  We will schedule this once she is completely healed from this surgery.     Leontine Locket, [facility] [phone]  Clinic MD:  Virl Cagey

## 2022-08-06 DIAGNOSIS — D509 Iron deficiency anemia, unspecified: Secondary | ICD-10-CM | POA: Diagnosis not present

## 2022-08-06 DIAGNOSIS — N2581 Secondary hyperparathyroidism of renal origin: Secondary | ICD-10-CM | POA: Diagnosis not present

## 2022-08-06 DIAGNOSIS — D631 Anemia in chronic kidney disease: Secondary | ICD-10-CM | POA: Diagnosis not present

## 2022-08-06 DIAGNOSIS — N186 End stage renal disease: Secondary | ICD-10-CM | POA: Diagnosis not present

## 2022-08-06 DIAGNOSIS — Z992 Dependence on renal dialysis: Secondary | ICD-10-CM | POA: Diagnosis not present

## 2022-08-07 ENCOUNTER — Ambulatory Visit (INDEPENDENT_AMBULATORY_CARE_PROVIDER_SITE_OTHER): Payer: Medicare Other | Admitting: Family Medicine

## 2022-08-07 ENCOUNTER — Encounter: Payer: Self-pay | Admitting: Family Medicine

## 2022-08-07 VITALS — BP 140/92 | HR 65 | Temp 98.1°F | Ht 65.0 in | Wt 196.2 lb

## 2022-08-07 DIAGNOSIS — Z992 Dependence on renal dialysis: Secondary | ICD-10-CM | POA: Diagnosis not present

## 2022-08-07 DIAGNOSIS — B370 Candidal stomatitis: Secondary | ICD-10-CM | POA: Diagnosis not present

## 2022-08-07 DIAGNOSIS — N760 Acute vaginitis: Secondary | ICD-10-CM | POA: Diagnosis not present

## 2022-08-07 DIAGNOSIS — N186 End stage renal disease: Secondary | ICD-10-CM | POA: Diagnosis not present

## 2022-08-07 MED ORDER — MAGIC MOUTHWASH W/LIDOCAINE: 5.0000 mL | Freq: Three times a day (TID) | ORAL | 0 refills | Status: AC | PRN

## 2022-08-07 MED ORDER — FLUCONAZOLE 150 MG PO TABS: 150.0000 mg | ORAL_TABLET | Freq: Once | ORAL | 0 refills | Status: AC

## 2022-08-07 NOTE — Progress Notes (Signed)
Acute Office Visit  Subjective:     [name], female    DOB: Mar 01, 1946, 77 y.o.   MRN: [id number]  Chief Complaint  Patient presents with   Mouth Lesions    Pt c/o mouth sore and states she has yeast infection from taking antibiotic at the hospital on 07/26/22. Noticed sx 2 wks ago. Used nystatin solution from PCP. Reports her mouth was bleeding on Monday.     HPI Patient is a 77 year old female with pmh sig for HTN, ESRD on HD, A-fib, AVM of colon, chronic constipation, GERD, OA in today for acute concern.  Pt is followed by Dr. [name].  Pt with thrush and vaginal yeast infection s/p course of Augmentin last week for insertion of tunneled dialysis cath 07/26/2022.  Patient given nystatin oral suspension on 08/01/2022 but notes continued symptoms.  Patient stopped using oral suspension as she felt it was causing worsening symptoms.  Patient notes vaginal irritation and discharge.  Review of Systems  HENT:         Mouth soreness, edema, irritation  Genitourinary:        Vaginal irritation, d/c        Objective:    BP (!) 140/92   Pulse 65   Temp 98.1 F (36.7 C) (Oral)   Ht '5\' 5"'$  (1.651 m)   Wt 196 lb 3.2 oz (89 kg)   SpO2 98%   BMI 32.65 kg/m    Physical Exam Constitutional:      Appearance: Normal appearance.  HENT:     Head: Normocephalic and atraumatic.     Nose: Nose normal.     Mouth/Throat:     Mouth: Mucous membranes are moist.     Pharynx: Posterior oropharyngeal erythema present. No oropharyngeal exudate.  Eyes:     Extraocular Movements: Extraocular movements intact.     Conjunctiva/sclera: Conjunctivae normal.     Pupils: Pupils are equal, round, and reactive to light.  Cardiovascular:     Rate and Rhythm: Regular rhythm.     Heart sounds: Normal heart sounds.  Pulmonary:     Effort: Pulmonary effort is normal.     Breath sounds: Normal breath sounds.  Skin:    General: Skin is warm and dry.  Neurological:     General: No focal  deficit present.     Mental Status: She is alert and oriented to person, place, and time.     No results found for any visits on 08/07/22.      Assessment & Plan:   Problem List Items Addressed This Visit       Genitourinary   ESRD on hemodialysis ([place])   Other Visit Diagnoses     Thrush    -  Primary   Relevant Medications   fluconazole (DIFLUCAN) 150 MG tablet   magic mouthwash w/lidocaine SOLN   Acute vaginitis       Relevant Medications   fluconazole (DIFLUCAN) 150 MG tablet       Meds ordered this encounter  Medications   fluconazole (DIFLUCAN) 150 MG tablet    Sig: Take 1 tablet (150 mg total) by mouth once for 1 dose.    Dispense:  1 tablet    Refill:  0   magic mouthwash w/lidocaine SOLN    Sig: Take 5 mLs by mouth 3 (three) times daily as needed for up to 5 days for mouth pain.    Dispense:  120 mL    Refill:  0  1 part diphenhydramine 12.5/5 mL, 1 part dexamethasone 0.5 mg / 5 mL, 1 part nystatin oral suspension, 1 part viscous lidocaine.   Thrush and likely vaginal candidiasis 2/2 recent abx use.  Start diflucan.  Magic mouthwash rx given.  Given precautions.  Return if symptoms worsen or fail to improve.  Billie Ruddy, MD

## 2022-08-08 ENCOUNTER — Telehealth: Payer: Self-pay | Admitting: Cardiology

## 2022-08-08 ENCOUNTER — Encounter: Payer: Self-pay | Admitting: Physician Assistant

## 2022-08-08 ENCOUNTER — Ambulatory Visit (INDEPENDENT_AMBULATORY_CARE_PROVIDER_SITE_OTHER): Payer: Medicare Other | Admitting: Physician Assistant

## 2022-08-08 VITALS — BP 199/82 | HR 61 | Temp 97.7°F | Resp 20 | Ht 65.0 in | Wt 196.0 lb

## 2022-08-08 DIAGNOSIS — D509 Iron deficiency anemia, unspecified: Secondary | ICD-10-CM | POA: Diagnosis not present

## 2022-08-08 DIAGNOSIS — D631 Anemia in chronic kidney disease: Secondary | ICD-10-CM | POA: Diagnosis not present

## 2022-08-08 DIAGNOSIS — N2581 Secondary hyperparathyroidism of renal origin: Secondary | ICD-10-CM | POA: Diagnosis not present

## 2022-08-08 DIAGNOSIS — N186 End stage renal disease: Secondary | ICD-10-CM | POA: Diagnosis not present

## 2022-08-08 DIAGNOSIS — Z992 Dependence on renal dialysis: Secondary | ICD-10-CM | POA: Diagnosis not present

## 2022-08-08 NOTE — Telephone Encounter (Signed)
  [facility]   Patient underwent LAAO closure with Watchman 02/06/22 and is now 6 month post implant and will no longer require ASA and Plavix. Called patient to confirm stopping the medications and she will no longer require dental SBE. We will follow with her at 1 year post implant or sooner if she needs. She has regular follow up with Dr. Einar Gip.   Kathyrn Drown NP-C Structural Heart Team  Pager: [phone] Phone: [phone]'

## 2022-08-11 DIAGNOSIS — N186 End stage renal disease: Secondary | ICD-10-CM | POA: Diagnosis not present

## 2022-08-11 DIAGNOSIS — Z992 Dependence on renal dialysis: Secondary | ICD-10-CM | POA: Diagnosis not present

## 2022-08-11 DIAGNOSIS — D631 Anemia in chronic kidney disease: Secondary | ICD-10-CM | POA: Diagnosis not present

## 2022-08-11 DIAGNOSIS — D509 Iron deficiency anemia, unspecified: Secondary | ICD-10-CM | POA: Diagnosis not present

## 2022-08-11 DIAGNOSIS — N2581 Secondary hyperparathyroidism of renal origin: Secondary | ICD-10-CM | POA: Diagnosis not present

## 2022-08-12 ENCOUNTER — Other Ambulatory Visit: Payer: Self-pay | Admitting: *Deleted

## 2022-08-12 ENCOUNTER — Telehealth: Payer: Self-pay

## 2022-08-12 ENCOUNTER — Ambulatory Visit: Payer: Medicare Other

## 2022-08-12 DIAGNOSIS — N186 End stage renal disease: Secondary | ICD-10-CM

## 2022-08-12 NOTE — Telephone Encounter (Signed)
VOB submitted for Monovisc, right knee. 

## 2022-08-13 ENCOUNTER — Other Ambulatory Visit: Payer: Self-pay | Admitting: Cardiology

## 2022-08-13 DIAGNOSIS — Z992 Dependence on renal dialysis: Secondary | ICD-10-CM | POA: Diagnosis not present

## 2022-08-13 DIAGNOSIS — N186 End stage renal disease: Secondary | ICD-10-CM | POA: Diagnosis not present

## 2022-08-13 DIAGNOSIS — N2581 Secondary hyperparathyroidism of renal origin: Secondary | ICD-10-CM | POA: Diagnosis not present

## 2022-08-13 DIAGNOSIS — D631 Anemia in chronic kidney disease: Secondary | ICD-10-CM | POA: Diagnosis not present

## 2022-08-13 DIAGNOSIS — D509 Iron deficiency anemia, unspecified: Secondary | ICD-10-CM | POA: Diagnosis not present

## 2022-08-14 ENCOUNTER — Encounter: Payer: Self-pay | Admitting: Cardiology

## 2022-08-14 ENCOUNTER — Ambulatory Visit: Payer: Medicare Other | Admitting: Cardiology

## 2022-08-14 ENCOUNTER — Telehealth: Payer: Self-pay

## 2022-08-14 VITALS — BP 178/74 | HR 68 | Resp 16 | Ht 65.0 in | Wt 191.4 lb

## 2022-08-14 DIAGNOSIS — I10 Essential (primary) hypertension: Secondary | ICD-10-CM

## 2022-08-14 DIAGNOSIS — Z992 Dependence on renal dialysis: Secondary | ICD-10-CM | POA: Diagnosis not present

## 2022-08-14 DIAGNOSIS — N186 End stage renal disease: Secondary | ICD-10-CM | POA: Diagnosis not present

## 2022-08-14 DIAGNOSIS — I480 Paroxysmal atrial fibrillation: Secondary | ICD-10-CM | POA: Diagnosis not present

## 2022-08-14 DIAGNOSIS — I951 Orthostatic hypotension: Secondary | ICD-10-CM | POA: Diagnosis not present

## 2022-08-14 DIAGNOSIS — Z95818 Presence of other cardiac implants and grafts: Secondary | ICD-10-CM

## 2022-08-14 NOTE — Telephone Encounter (Signed)
Patient home blood pressure has been elevated the past few weeks. Patient's dialysis doctor has prescribed hydralazine TID.  Average Systolic BP Level 631.49 mmHg Lowest Systolic BP Level 702 mmHg Highest Systolic BP Level 637 mmHg  Average Diastolic BP Level 85.8 mmHg Lowest Diastolic BP Level 54 mmHg Highest Diastolic BP Level 98 mmHg  Average Pulse Level 66 BPM Lowest Pulse Level 53 BPM Highest Pulse Level 84 BPM  08/13/2022 Wednesday at 06:58 PM 162 / 85      08/12/2022 Tuesday at 04:34 AM 175 / 97      08/11/2022 Monday at 03:10 PM 141 / 70      08/11/2022 Monday at 12:05 PM 139 / 88      08/10/2022 'Sunday at 10:30 AM 165 / 80      08/10/2022 Sunday at 01:53 AM 169 / 91      08/10/2022 Sunday at 01:27 AM 163 / 93      08/09/2022 Saturday at 06:46 AM 147 / 98      08/08/2022 Friday at 02:51 AM 168 / 90      08/07/2022 Thursday at 07:46 PM 142 / 60      08/05/2022 Tuesday at 06:51 AM 161 / 79      08/04/2022 Monday at 04:58 PM 166 / 79      08/02/2022 Saturday at 11:57 AM 127 / 72      08/01/2022 Friday at 03:30 PM 154 / 87      01'$ /05/2023 Thursday at 04:32 PM 122 / 80

## 2022-08-14 NOTE — Telephone Encounter (Signed)
Reviewed will address hypertensin meds on OV 08/14/22

## 2022-08-14 NOTE — Progress Notes (Signed)
Primary Physician/Referring:  [name], [name], MD  [name], female    DOB: 14-Sep-1945, 77 y.o.   MRN: [id number]  Chief Complaint  Patient presents with   Atrial Fibrillation   Hypertension   Follow-up    1 year   HPI:    Jane [name]  is a 77 y.o. female with paroxysmal atrial fibrillation, end-stage disease on hemodialysis on Monday Wednesday and Friday, primary hypertension and orthostatic hypotension, obstructive sleep apnea not on CPAP.   Due to recurrent GI bleed including gastritis, gastric polyps, colonic polyps, AVMs,  in spite of partial colectomy in 2019 She underwent left atrial appendage closure on 02/06/2022 with implantation of a 27 mm watchman flex device.  She also underwent right forearm AV fistula ligated on 07/26/2022 due to recurrent bleeding after dialysis and inability to control bleeding.  She now has a Groshong catheter in the subclavian artery.  Distal blood pressure started to rise again.  This is being managed by her nephrologist and was prescribed hydralazine recently.   She has had occasional episodes of brief palpitations suggestive of atrial fibrillation otherwise remains asymptomatic.  Past Medical History:  Diagnosis Date   Acid reflux    Anemia of chronic disease    Arthritis    Asthma    AVM (arteriovenous malformation)    Bilateral carotid bruits    Diverticulitis    Duodenal ulcer    ESRD (end stage renal disease) ([place])    MWF [place]   Hemorrhoids    History of blood transfusion    Hypertension    Malaise and fatigue    Orthostatic hypotension    PAF (paroxysmal atrial fibrillation) ([place])    Presence of Watchman left atrial appendage closure device 02/06/2022   Watchman FLX 11m with Dr. CBurt Knack  Sleep apnea    Syncope    Tubulovillous adenoma of colon    Social History   Tobacco Use   Smoking status: Never    Passive exposure: Never   Smokeless tobacco: Never  Substance Use Topics   Alcohol use: No    ROS  Review of Systems  Cardiovascular:  Positive for palpitations. Negative for chest pain, dyspnea on exertion and leg swelling.   Objective  Blood pressure (!) 178/74, pulse 68, resp. rate 16, height '5\' 5"'$  (1.651 m), weight 191 lb 6.4 oz (86.8 kg), SpO2 100 %.      08/14/2022   10:12 AM 08/08/2022    1:26 PM 08/07/2022    9:34 AM  Vitals with BMI  Height '5\' 5"'$  '5\' 5"'$    Weight 191 lbs 6 oz 196 lbs   BMI 306.23376.28  Systolic [id number] Diastolic 74 82 92  Pulse 68 61     Orthostatic VS for the past 72 hrs (Last 3 readings):  Orthostatic BP Patient Position BP Location Cuff Size  08/14/22 1119 160/60 Standing Left Arm --  08/14/22 1118 170/70 Sitting Left Arm --  08/14/22 1012 -- Sitting Left Arm Large     Physical Exam Constitutional:      Comments: Moderately obese  Neck:     Vascular: Carotid bruit (Bilateral) present. No JVD.  Cardiovascular:     Rate and Rhythm: Regular rhythm. Bradycardia present.     Pulses: Intact distal pulses.     Heart sounds: Murmur heard.     Early systolic murmur is present with a grade of 2/6 at the upper right sternal border.  No gallop.  Pulmonary:     Effort: Pulmonary effort is normal.     Breath sounds: Normal breath sounds.  Abdominal:     General: Bowel sounds are normal.     Palpations: Abdomen is soft.  Musculoskeletal:     Right lower leg: No edema.     Left lower leg: No edema.    Laboratory examination:   Recent Labs    09/27/21 1211 09/29/21 0658 10/02/21 1113 10/02/21 1113 02/03/22 1236 02/06/22 0632 03/19/22 1621 07/04/22 1958 07/26/22 1103  NA 137   < > 134*   < > 136   < > 140 139 140  K 4.5   < > 3.6  --  3.6   < > 4.2 4.5 4.7  CL 98   < > 97*  --  95*   < > 95* 101 97*  CO2 21*  --  25  --  33*  --  24 27  --   GLUCOSE 74   < > 107*   < > 120*   < > 103* 96 84  BUN 44*   < > 31*   < > 26   < > 23 10 34*  CREATININE 10.35*   < > 9.64*  --  6.82*   < > 5.86* 5.93* 9.90*  CALCIUM 9.6  --  9.0   --  9.2  --  9.2 8.7*  --   GFRNONAA 4*  --  4*  --   --   --   --  7*  --    < > = values in this interval not displayed.      Latest Ref Rng & Units 07/26/2022   11:03 AM 07/04/2022    7:58 PM 03/19/2022    4:21 PM  CMP  Glucose 70 - 99 mg/dL 84  96  103   BUN 8 - 23 mg/dL 34  10  23   Creatinine 0.44 - 1.00 mg/dL 9.90  5.93  5.86   Sodium 135 - 145 mmol/L 140  139  140   Potassium 3.5 - 5.1 mmol/L 4.7  4.5  4.2   Chloride 98 - 111 mmol/L 97  101  95   CO2 22 - 32 mmol/L  27  24   Calcium 8.9 - 10.3 mg/dL  8.7  9.2   Total Protein 6.5 - 8.1 g/dL  7.3    Total Bilirubin 0.3 - 1.2 mg/dL  0.4    Alkaline Phos 38 - 126 U/L  231    AST 15 - 41 U/L  23    ALT 0 - 44 U/L  15        Latest Ref Rng & Units 07/26/2022   11:03 AM 07/04/2022    7:58 PM 05/23/2022    2:00 PM  CBC  WBC 4.0 - 10.5 K/uL  5.8  3.9   Hemoglobin 12.0 - 15.0 g/dL 13.3  11.7  11.8   Hematocrit 36.0 - 46.0 % 39.0  39.8  36.6   Platelets 150 - 400 K/uL  45  159.0    Lab Results  Component Value Date   TSH 1.36 11/20/2020    Lab Results  Component Value Date   CHOL 198 10/06/2019   HDL 53 10/06/2019   LDLCALC 135 (H) 10/06/2019   TRIG 51 10/06/2019   CHOLHDL 3.7 10/06/2019    External labs:   Medications and allergies   Allergies  Allergen Reactions   Latex Rash  Penicillins Other (See Comments)    Yeast infection / Childhood   Sulfa Antibiotics Rash   Tape Other (See Comments)    Plastic, silicone, and paper tape causes bruising and pulls off skin. Cloth tape works fine   Reglan [Metoclopramide] Itching    Itchiness, bug crawling sensation      Current Outpatient Medications:    atorvastatin (LIPITOR) 10 MG tablet, Take 1 tablet (10 mg total) by mouth daily., Disp: 90 tablet, Rfl: 1   B Complex-C-Zn-Folic Acid (DIALYVITE 937 WITH ZINC) 0.8 MG TABS, Take 1 tablet by mouth at bedtime., Disp: , Rfl:    carvedilol (COREG) 12.5 MG tablet, Take 1 tablet (12.5 mg total) by mouth 2 (two) times daily  with a meal., Disp: 180 tablet, Rfl: 1   cinacalcet (SENSIPAR) 30 MG tablet, Take 30 mg by mouth every Monday, Wednesday, and Friday with hemodialysis. Post dialysis, Disp: , Rfl:    diltiazem (CARDIZEM) 60 MG tablet, Take 1 tablet (60 mg total) by mouth 3 (three) times daily as needed (Atrial fibrillation)., Disp: 30 tablet, Rfl: 1   famotidine (PEPCID) 40 MG tablet, Take 1 tablet (40 mg total) by mouth 2 (two) times daily., Disp: 180 tablet, Rfl: 1   hydrALAZINE (APRESOLINE) 25 MG tablet, Take 25 mg by mouth 3 (three) times daily., Disp: , Rfl:    lansoprazole (PREVACID) 30 MG capsule, Take 1 capsule (30 mg total) by mouth 2 (two) times daily., Disp: 60 capsule, Rfl: 2   LINZESS 72 MCG capsule, TAKE ONE CAPSULE BY MOUTH BEFORE BREAKFAST, Disp: 30 capsule, Rfl: 2   Methoxy PEG-Epoetin Beta (MIRCERA IJ), Inject 1 Dose into the vein every Monday, Wednesday, and Friday with hemodialysis., Disp: , Rfl:    sevelamer carbonate (RENVELA) 800 MG tablet, Take 1,600 mg by mouth in the morning and at bedtime. With meals take 800 mg with snack, Disp: , Rfl:   Current Facility-Administered Medications:    betamethasone acetate-betamethasone sodium phosphate (CELESTONE) injection 12 mg, 12 mg, Intramuscular, Once, Kirsteins, Luanna Salk, MD   lidocaine (XYLOCAINE) 1 % (with pres) injection 4 mL, 4 mL, Other, Once, Kirsteins, Luanna Salk, MD   Radiology:   No results found.  Cardiac Studies:    Lexiscan myoview stress test 09/06/2018:  1. Lexiscan stress test was performed. Exercise capacity was not assessed. Stress symptoms included dyspnea, dizziness. Peak effect blood pressure was 158/68 mmHg. The resting and stress electrocardiogram demonstrated normal sinus rhythm, normal resting conduction, no resting arrhythmias and normal rest repolarization.  Stress EKG is non diagnostic for ischemia as it is a pharmacologic stress.  2. The overall quality of the study is good. There is no evidence of abnormal lung  activity. Stress and rest SPECT images demonstrate homogeneous tracer distribution throughout the myocardium. Gated SPECT imaging reveals normal myocardial thickening and wall motion. The left ventricular ejection fraction was normal (61%).   3. Low risk study.  Carotid artery duplex  04/24/2020: Stenosis in the right external carotid artery (<50%). Minimal stenosis in the left internal carotid artery (minimal). Stenosis in the left common carotid artery (<50%). There is mild diffuse heterogeneous plaque in bilateral carotid arteries. Antegrade right vertebral artery flow.  Follow up in one year is appropriate if clinically indicated. No significant change from 12/02/2016.  Echocardiogram 12/27/2020: Normal LV systolic function with visual EF 60-65%. Left ventricle cavity is normal in size. Moderate left ventricular hypertrophy. Normal global wallmotion. Indeterminate diastolic filling pattern, elevated LAP. No significant valvular heart disease. Compared to study  dated 11/24/2017: LVEF is preserved, G2DD is now indeterminate with elevated LAP, MR and TR have improved, otherwise nosignificant change.  EKG  EKG 08/14/2022: Normal sinus rhythm at the rate of 70 bpm, normal axis, no evidence of ischemia, normal EKG.  Compared to 03/26/2022, no significant change.  Assessment     ICD-10-CM   1. Presence of Watchman left atrial appendage closure device  Z95.818     2. Paroxysmal atrial fibrillation (HCC)  I48.0 EKG 12-Lead    3. Primary hypertension  I10     4. Orthostatic hypotension  I95.1     5. ESRD on hemodialysis (HCC)  N18.6    Z99.2       CHA2DS2-VASc Score is 4.  Yearly risk of stroke: 4.8% (HTN, female, age 72).  Score of 1=0.6; 2=2.2; 3=3.2; 4=4.8; 5=7.2; 6=9.8; 7=>9.8) -(CHF; HTN; vasc disease DM,  Female = 1; Age <65 =0; 65-74 = 1,  >75 =2; stroke/embolism= 2).   No orders of the defined types were placed in this encounter.  Medications Discontinued During This Encounter   Medication Reason   nystatin (MYCOSTATIN) 100000 UNIT/ML suspension    traMADol (ULTRAM) 50 MG tablet Completed Course    Recommendations:   Jane [name]  is a 77 y.o. female with paroxysmal atrial fibrillation, end-stage disease on hemodialysis on Monday Wednesday and Friday, primary hypertension and orthostatic hypotension, obstructive sleep apnea not on CPAP.   Due to recurrent GI bleed including gastritis, gastric polyps, colonic polyps, AVMs,  in spite of partial colectomy in 2019 She underwent left atrial appendage closure on 02/06/2022 with implantation of a 27 mm watchman flex device.  1. Presence of Watchman left atrial appendage closure device Patient underwent LAAO closure with Watchman 02/06/22 and is now 6 month post implant and will no longer require ASA and Plavix.  2. Paroxysmal atrial fibrillation (HCC) Patient occasionally has breakthrough atrial fibrillation, she takes diltiazem on a as needed basis.  She will continue with carvedilol 12.5 mg twice daily.  3. Primary hypertension Lately her blood pressure has been very high.  She has been restarted back on hydralazine which had controlled her blood pressure very well at 25 g 3 times daily, she has not taken it this morning as the pharmacy is delivering it today.  She will let us know, patient is enrolled in RPM.  We could certainly go up on the dose.  Today she was not significantly orthostatic.  Enrolled in RPM December 2023 summary Average Systolic BP Level 588.50 mmHg Lowest Systolic BP Level 92 mmHg Highest Systolic BP Level 277 mmHg  4. Orthostatic hypotension Today from sitting to standing she only dropped blood pressure by 10 points.  She is not taking midodrine on a regular basis, takes it on a as needed basis with dizziness and low blood pressure only.  She has not had any significant difficulty with dialysis.  Recently on 07/26/2022, hide right forearm AV fistula was ligated due to recurrent bleeding and now has a  Groshong catheter in the right axillary artery.  5. ESRD on hemodialysis [facility]) She is presently on hemodialysis.  Tolerating this well.  She has not had any hypotensive effects.  I would like to see him back in 3 months.    Adrian Prows, MD, [facility] 08/14/2022, [place] AM Office: [phone]

## 2022-08-15 DIAGNOSIS — N186 End stage renal disease: Secondary | ICD-10-CM | POA: Diagnosis not present

## 2022-08-15 DIAGNOSIS — Z992 Dependence on renal dialysis: Secondary | ICD-10-CM | POA: Diagnosis not present

## 2022-08-15 DIAGNOSIS — N2581 Secondary hyperparathyroidism of renal origin: Secondary | ICD-10-CM | POA: Diagnosis not present

## 2022-08-15 DIAGNOSIS — D631 Anemia in chronic kidney disease: Secondary | ICD-10-CM | POA: Diagnosis not present

## 2022-08-15 DIAGNOSIS — D509 Iron deficiency anemia, unspecified: Secondary | ICD-10-CM | POA: Diagnosis not present

## 2022-08-18 DIAGNOSIS — D631 Anemia in chronic kidney disease: Secondary | ICD-10-CM | POA: Diagnosis not present

## 2022-08-18 DIAGNOSIS — Z992 Dependence on renal dialysis: Secondary | ICD-10-CM | POA: Diagnosis not present

## 2022-08-18 DIAGNOSIS — D509 Iron deficiency anemia, unspecified: Secondary | ICD-10-CM | POA: Diagnosis not present

## 2022-08-18 DIAGNOSIS — N186 End stage renal disease: Secondary | ICD-10-CM | POA: Diagnosis not present

## 2022-08-18 DIAGNOSIS — N2581 Secondary hyperparathyroidism of renal origin: Secondary | ICD-10-CM | POA: Diagnosis not present

## 2022-08-20 DIAGNOSIS — N2581 Secondary hyperparathyroidism of renal origin: Secondary | ICD-10-CM | POA: Diagnosis not present

## 2022-08-20 DIAGNOSIS — Z992 Dependence on renal dialysis: Secondary | ICD-10-CM | POA: Diagnosis not present

## 2022-08-20 DIAGNOSIS — D509 Iron deficiency anemia, unspecified: Secondary | ICD-10-CM | POA: Diagnosis not present

## 2022-08-20 DIAGNOSIS — N186 End stage renal disease: Secondary | ICD-10-CM | POA: Diagnosis not present

## 2022-08-20 DIAGNOSIS — D631 Anemia in chronic kidney disease: Secondary | ICD-10-CM | POA: Diagnosis not present

## 2022-08-21 ENCOUNTER — Telehealth (INDEPENDENT_AMBULATORY_CARE_PROVIDER_SITE_OTHER): Payer: Medicare Other | Admitting: Family Medicine

## 2022-08-21 VITALS — BP 117/59 | Ht 65.0 in

## 2022-08-21 DIAGNOSIS — Z0000 Encounter for general adult medical examination without abnormal findings: Secondary | ICD-10-CM

## 2022-08-21 DIAGNOSIS — N186 End stage renal disease: Secondary | ICD-10-CM | POA: Diagnosis not present

## 2022-08-21 DIAGNOSIS — Z992 Dependence on renal dialysis: Secondary | ICD-10-CM | POA: Diagnosis not present

## 2022-08-21 DIAGNOSIS — I158 Other secondary hypertension: Secondary | ICD-10-CM | POA: Diagnosis not present

## 2022-08-21 NOTE — Progress Notes (Signed)
PATIENT CHECK-IN and HEALTH RISK ASSESSMENT QUESTIONNAIRE:  -completed by phone/video for upcoming Medicare Preventive Visit  Pre-Visit Check-in: 1)Vitals (height, wt, BP, etc) - record in vitals section for visit on day of visit 2)Review and Update Medications, Allergies PMH, Surgeries, Social history in Epic 3)Hospitalizations in the last year with date/reason? Yes/ Bleeding from fistula 4)Review and Update Care Team (patient's specialists) in Epic 5) Complete PHQ9 in Epic  6) Complete Fall Screening in [place] Maintenance Due and order under PCP if not done.  8)Medicare Wellness Questionnaire: Answer theses question about your habits: Do you drink alcohol? No  Have you ever smoked?No Quit date if applicable? N/A  How many packs a day do/did you smoke? N/A Do you use smokeless tobacco?No Do you use an illicit drugs?No Do you exercises? No - not right now Are you sexually active? No Number of partners?N/A Typical breakfast- Does not eat breakfast Typical lunch- Varies  Typical dinner: Soup, Varies  Typical snacks: Cake and candy  Beverages: Tea  Answer theses question about you: Can you perform most household chores? No Do you find it hard to follow a conversation in a noisy room?No  Do you often ask people to speak up or repeat themselves?No Do you feel that you have a problem with memory?No Do you balance your checkbook and or bank acounts?Yes Do you feel safe at home? Yes  Last dentist visit? 2 years ago Do you need assistance with any of the following: Please note if so   Driving? Yes  Feeding yourself? No  Getting from bed to chair? No  Getting to the toilet? No  Bathing or showering? No  Dressing yourself? No  Managing money? No  Climbing a flight of stairs No  Preparing meals? No  Do you have Advanced Directives in place (Living Will, Healthcare Power or Attorney)? Yes   Last eye Exam and location? 07/2020/ [facility]    Do you  currently use prescribed or non-prescribed narcotic or opioid pain medications?No  Do you have a history or close family history of breast, ovarian, tubal or peritoneal cancer or a family member with BRCA (breast cancer susceptibility 1 and 2) gene mutations? Yes daughter  Nurse/Assistant Credentials/time stamp: MG 11:51 AM    ----------------------------------------------------------------------------------------------------------------------------------------------------------------------------------------------------------------------   MEDICARE ANNUAL PREVENTIVE VISIT WITH PROVIDER: (Welcome to [employer], initial annual wellness or annual wellness exam)  Virtual Visit - Phone Note  I connected with [place]  on 08/21/22 by phone  and verified that I am speaking with the correct person using two identifiers.  Location patient: home Location provider:work or home office Persons participating in the virtual visit: patient, provider  Concerns and/or follow up today: Seeing her heart doctor and her kidney doctor for elevated BP - they have added some new medications and it is coming back down. Has seen her specialists in the last week for this. They are monitoring her home BPs. Also had issues with her fistula recently - specialists are managing. All of this has been a bit stressful. Sees her specialists today and tomorrow for this. Friends and daughter are helping her with meals and things right now.   See HM section in Epic for other details of completed HM.    Patient-completed extensive health risk assessment - reviewed and discussed with the patient: See Health Risk Assessment completed with patient prior to the visit either above or in recent phone note. This was reviewed in detailed with the patient today and appropriate recommendations, orders  and referrals were placed as needed per Summary below and patient instructions.   Review of Medical History: -PMH, [place], Family History and  current specialty and care providers reviewed and updated and listed below   Patient Care Team: [name], [name], MD as PCP - General (Family Medicine) Sidonie Dickens, MD as Referring Physician (Psychiatry) Center, [place], Mariam Dollar, [place] (Inactive) as Pharmacist (Pharmacist) Rebekah Chesterfield, LCSW as Social Worker (Licensed Holiday representative)   Past Medical History:  Diagnosis Date   Acid reflux    Anemia of chronic disease    Arthritis    Asthma    AVM (arteriovenous malformation)    Bilateral carotid bruits    Diverticulitis    Duodenal ulcer    ESRD (end stage renal disease) ([place])    [facility]   Hemorrhoids    History of blood transfusion    Hypertension    Malaise and fatigue    Orthostatic hypotension    PAF (paroxysmal atrial fibrillation) ([place])    Presence of Watchman left atrial appendage closure device 02/06/2022   Watchman FLX 85m with Dr. CBurt Knack  Sleep apnea    Syncope    Tubulovillous adenoma of colon     Past Surgical History:  Procedure Laterality Date   A/V FISTULAGRAM Right 10/18/2020   Procedure: A/V FISTULAGRAM;  Surgeon: CMarty Heck MD;  [facility];  Service: Cardiovascular;  Laterality: Right;   A/V FISTULAGRAM Right 09/05/2021   Procedure: A/V Fistulagram;  Surgeon: CMarty Heck MD;  [facility];  Service: Cardiovascular;  Laterality: Right;   AV FISTULA PLACEMENT     BACK SURGERY     Lumbar fusion L 4 and L 5   BIOPSY  01/09/2020   Procedure: BIOPSY;  Surgeon: MRush LandmarkGTelford Nab, MD;  [facility]: [facility]  Service: Gastroenterology;;   BIOPSY  01/31/2021   Procedure: BIOPSY;  Surgeon: MIrving Copas, MD;  [facility]  Service: Gastroenterology;;   BIOPSY  09/26/2021   Procedure: BIOPSY;  Surgeon: BThornton Park MD;  [facility]  Service: Gastroenterology;;   COLONOSCOPY     COLONOSCOPY N/A 02/01/2021    Procedure: COLONOSCOPY;  Surgeon: AYetta Flock MD;  [facility]  Service: Gastroenterology;  Laterality: N/A;   COLONOSCOPY WITH PROPOFOL N/A 09/26/2021   Procedure: COLONOSCOPY WITH PROPOFOL;  Surgeon: BThornton Park MD;  [facility]  Service: Gastroenterology;  Laterality: N/A;   COLONOSCOPY WITH PROPOFOL N/A 09/29/2021   Procedure: COLONOSCOPY WITH PROPOFOL;  Surgeon: BThornton Park MD;  [facility]  Service: Gastroenterology;  Laterality: N/A;   COLONOSCOPY WITH PROPOFOL N/A 10/01/2021   Procedure: COLONOSCOPY WITH PROPOFOL;  Surgeon: NMauri Pole MD;  [facility]: [facility]  Service: Gastroenterology;  Laterality: N/A;   ENDOSCOPIC MUCOSAL RESECTION N/A 01/09/2020   Procedure: ENDOSCOPIC MUCOSAL RESECTION;  Surgeon: MRush LandmarkGTelford Nab, MD;  [facility]  Service: Gastroenterology;  Laterality: N/A;   ENDOSCOPIC MUCOSAL RESECTION N/A 01/31/2021   Procedure: ENDOSCOPIC MUCOSAL RESECTION;  Surgeon: MRush LandmarkGTelford Nab, MD;  Location: [facility]  Service: Gastroenterology;  Laterality: N/A;   ENTEROSCOPY N/A 09/26/2021   Procedure: ENTEROSCOPY;  Surgeon: BThornton Park MD;  [facility]  Service: Gastroenterology;  Laterality: N/A;   ESOPHAGOGASTRODUODENOSCOPY     ESOPHAGOGASTRODUODENOSCOPY (EGD) WITH PROPOFOL N/A 01/09/2020   Procedure: ESOPHAGOGASTRODUODENOSCOPY (EGD) WITH PROPOFOL;  Surgeon: MRush LandmarkGTelford Nab, MD;  [facility]: [facility]  Service: Gastroenterology;  Laterality: N/A;   ESOPHAGOGASTRODUODENOSCOPY (EGD) WITH PROPOFOL N/A 01/13/2020   Procedure: ESOPHAGOGASTRODUODENOSCOPY (EGD) WITH PROPOFOL;  Surgeon: Irene Shipper, MD;  Location: [place];  Service: Gastroenterology;  Laterality: N/A;   ESOPHAGOGASTRODUODENOSCOPY (EGD) WITH PROPOFOL N/A 01/31/2021   Procedure: ESOPHAGOGASTRODUODENOSCOPY (EGD) WITH PROPOFOL;  Surgeon: Rush Landmark Telford Nab., MD;  Location: [place];  Service:  Gastroenterology;  Laterality: N/A;   EUS N/A 01/09/2020   Procedure: UPPER ENDOSCOPIC ULTRASOUND (EUS) RADIAL;  Surgeon: Irving Copas., MD;  Location: [facility];  Service: Gastroenterology;  Laterality: N/A;   FALSE ANEURYSM REPAIR Right 07/26/2022   Procedure: REPAIR RIGHT RADIO-CEPHALIC FISTULA ANEURYSM;  Surgeon: Broadus John, MD;  Location: [place];  Service: Vascular;  Laterality: Right;   GIVENS CAPSULE STUDY  09/29/2021   Procedure: GIVENS CAPSULE STUDY;  Surgeon: Thornton Park, MD;  Location: [place];  Service: Gastroenterology;;   HEMOSTASIS CLIP PLACEMENT  01/09/2020   Procedure: HEMOSTASIS CLIP PLACEMENT;  Surgeon: Irving Copas., MD;  Location: [place];  Service: Gastroenterology;;   HEMOSTASIS CLIP PLACEMENT  01/31/2021   Procedure: HEMOSTASIS CLIP PLACEMENT;  Surgeon: Irving Copas., MD;  Location: [place];  Service: Gastroenterology;;   HEMOSTASIS CONTROL  01/13/2020   Procedure: HEMOSTASIS CONTROL;  Surgeon: Irene Shipper, MD;  Location: [place];  Service: Gastroenterology;;  hemaspray   HOT HEMOSTASIS N/A 01/13/2020   Procedure: HOT HEMOSTASIS (ARGON PLASMA COAGULATION/BICAP);  Surgeon: Irene Shipper, MD;  Location: [place];  Service: Gastroenterology;  Laterality: N/A;   HOT HEMOSTASIS N/A 02/01/2021   Procedure: HOT HEMOSTASIS (ARGON PLASMA COAGULATION/BICAP);  Surgeon: Yetta Flock, MD;  Location: [facility];  Service: Gastroenterology;  Laterality: N/A;   HOT HEMOSTASIS N/A 10/01/2021   Procedure: HOT HEMOSTASIS (ARGON PLASMA COAGULATION/BICAP);  Surgeon: Mauri Pole, MD;  Location: [place];  Service: Gastroenterology;  Laterality: N/A;   INSERTION OF DIALYSIS CATHETER Left 07/26/2022   Procedure: INSERTION OF TUNNELED DIALYSIS CATHETER;  Surgeon: Broadus John, MD;  Location[facility];  Service: Vascular;  Laterality: Left;   IR GENERIC HISTORICAL  07/09/2016   IR US GUIDE VASC ACCESS RIGHT  07/09/2016 Arne Cleveland, MD [facility]   IR GENERIC HISTORICAL  07/09/2016   IR FLUORO GUIDE CV LINE RIGHT 07/09/2016 Arne Cleveland, MD [facility]   KNEE ARTHROPLASTY Left    LAPAROSCOPIC SIGMOID COLECTOMY N/A 07/11/2016   Procedure: LAPAROSCOPIC SIGMOID COLECTOMY;  Surgeon: Clovis Riley, MD;  Location: [place];  Service: General;  Laterality: N/A;   LEFT ATRIAL APPENDAGE OCCLUSION N/A 02/06/2022   Procedure: LEFT ATRIAL APPENDAGE OCCLUSION;  Surgeon: Sherren Mocha, MD;  Location: [place] [facility];  Service: Cardiovascular;  Laterality: N/A;   LIGATION OF ARTERIOVENOUS  FISTULA Right 07/26/2022   Procedure: LIGATION OF RIGHT RADIO-CEPHALIC ARTERIOVENOUS  FISTULA;  Surgeon: Broadus John, MD;  Location: [place];  Service: Vascular;  Laterality: Right;   MASS EXCISION Right 04/10/2020   Procedure: EXCISION SKIN NODULE RIGHT FOREARM;  Surgeon: Waynetta Sandy, MD;  Location: [place];  Service: Vascular;  Laterality: Right;   POLYPECTOMY  09/26/2021   Procedure: POLYPECTOMY;  Surgeon: Thornton Park, MD;  Location: [place];  Service: Gastroenterology;;   POLYPECTOMY  10/01/2021   Procedure: POLYPECTOMY;  Surgeon: Mauri Pole, MD;  Location: [place];  Service: Gastroenterology;;   REVISON OF ARTERIOVENOUS FISTULA Right 07/04/2022   Procedure: REVISON OF RIGHT FOREARM ARTERIOVENOUS FISTULA;  Surgeon: Cherre Robins, MD;  Location: [place];  Service: Vascular;  Laterality:  Right;   SCLEROTHERAPY  01/09/2020   Procedure: Clide Deutscher;  Surgeon: Mansouraty, Telford Nab., MD;  Location: [place];  Service: Gastroenterology;;   Clide Deutscher  01/13/2020   Procedure: Clide Deutscher;  Surgeon: Irene Shipper, MD;  Location: [place];  Service: Gastroenterology;;   Maryagnes Amos INJECTION  01/09/2020   Procedure: SUBMUCOSAL LIFTING INJECTION;  Surgeon: Irving Copas., MD;  Location: [place];  Service: Gastroenterology;;   TEE WITHOUT CARDIOVERSION  N/A 02/06/2022   Procedure: TRANSESOPHAGEAL ECHOCARDIOGRAM (TEE);  Surgeon: Sherren Mocha, MD;  Location: [place] [facility];  Service: Cardiovascular;  Laterality: N/A;   TUBAL LIGATION      Social History   Socioeconomic History   Marital status: Divorced    Spouse name: Not on file   Number of children: 4   Years of education: 14   Highest education level: Associate degree: occupational, Hotel manager, or vocational program  Occupational History   Occupation: Retired  Tobacco Use   Smoking status: Never    Passive exposure: Never   Smokeless tobacco: Never  Vaping Use   Vaping Use: Never used  Substance and Sexual Activity   Alcohol use: No   Drug use: No   Sexual activity: Never  Other Topics Concern   Not on file  Social History Narrative   HH 1   Divorced   Outpatient dialysis Mon, Wed, Fri   4 children: 1 daughter locally is an Designer, multimedia and 3 sons in [place] Determinants of Health   Financial Resource Strain: High Risk (07/09/2022)   Overall Financial Resource Strain (CARDIA)    Difficulty of Paying Living Expenses: Hard  Food Insecurity: Food Insecurity Present (08/21/2021)   Hunger Vital Sign    Worried About [place] in the Last Year: Often true    [place] in the Last Year: Often true  Transportation Needs: Unmet Transportation Needs (08/21/2021)   PRAPARE - Hydrologist (Medical): Yes    Lack of Transportation (Non-Medical): No  Physical Activity: Inactive (08/06/2021)   Exercise Vital Sign    Days of Exercise per Week: 0 days    Minutes of Exercise per Session: 0 min  Stress: Stress Concern Present (08/21/2021)   [place]    Feeling of Stress : Very much  Social Connections: Moderately Isolated (07/31/2020)   Social Connection and Isolation Panel [NHANES]    Frequency of Communication with Friends and Family: More than three times a  week    Frequency of Social Gatherings with Friends and Family: Never    Attends Religious Services: More than 4 times per year    Active Member of [employer] or Organizations: No    Attends Archivist Meetings: Never    Marital Status: Divorced  Human resources officer Violence: Not At Risk (07/31/2020)   Humiliation, Afraid, Rape, and Kick questionnaire    Fear of Current or Ex-Partner: No    Emotionally Abused: No    Physically Abused: No    Sexually Abused: No    Family History  Problem Relation Age of Onset   Heart failure Mother    Stroke Mother    Other Father    Colon cancer Neg Hx    Liver disease Neg Hx    Esophageal cancer Neg Hx    Stomach cancer Neg Hx    Inflammatory bowel disease Neg Hx    Rectal cancer Neg Hx  Pancreatic cancer Neg Hx     Current Outpatient Medications on File Prior to Visit  Medication Sig Dispense Refill   Methoxy PEG-Epoetin Beta (MIRCERA IJ) Inject as directed.     atorvastatin (LIPITOR) 10 MG tablet Take 1 tablet (10 mg total) by mouth daily. 90 tablet 1   carvedilol (COREG) 12.5 MG tablet Take 1 tablet (12.5 mg total) by mouth 2 (two) times daily with a meal. 180 tablet 1   cinacalcet (SENSIPAR) 30 MG tablet Take 30 mg by mouth every Monday, Wednesday, and Friday with hemodialysis. Post dialysis     diltiazem (CARDIZEM) 60 MG tablet Take 1 tablet (60 mg total) by mouth 3 (three) times daily as needed (Atrial fibrillation). 30 tablet 1   famotidine (PEPCID) 40 MG tablet Take 1 tablet (40 mg total) by mouth 2 (two) times daily. 180 tablet 1   hydrALAZINE (APRESOLINE) 25 MG tablet Take 25 mg by mouth 3 (three) times daily.     lansoprazole (PREVACID) 30 MG capsule Take 1 capsule (30 mg total) by mouth 2 (two) times daily. 60 capsule 2   LINZESS 72 MCG capsule TAKE ONE CAPSULE BY MOUTH BEFORE BREAKFAST 30 capsule 2   sevelamer carbonate (RENVELA) 800 MG tablet Take 1,600 mg by mouth in the morning and at bedtime. With meals take 800 mg with  snack     Current Facility-Administered Medications on File Prior to Visit  Medication Dose Route Frequency Provider Last Rate Last Admin   betamethasone acetate-betamethasone sodium phosphate (CELESTONE) injection 12 mg  12 mg Intramuscular Once Kirsteins, Luanna Salk, MD       lidocaine (XYLOCAINE) 1 % (with pres) injection 4 mL  4 mL Other Once Kirsteins, Luanna Salk, MD        Allergies  Allergen Reactions   Latex Rash   Penicillins Other (See Comments)    Yeast infection / Childhood   Sulfa Antibiotics Rash   Tape Other (See Comments)    Plastic, silicone, and paper tape causes bruising and pulls off skin. Cloth tape works fine   Reglan [Metoclopramide] Itching    Itchiness, bug crawling sensation        Physical Exam Vitals:   08/21/22 1132  BP: (!) 117/59   Estimated body mass index is 31.85 kg/m as calculated from the following:   Height as of this encounter: '5\' 5"'$  (1.651 m).   Weight as of 08/14/22: 191 lb 6.4 oz (86.8 kg).  EKG (optional): deferred due to virtual visit  GENERAL: alert, oriented, no acute distress detected, full vision exam deferred due to pandemic and/or virtual encounter  PSYCH/NEURO: pleasant and cooperative, no obvious depression or anxiety, speech and thought processing grossly intact, Cognitive function grossly intact  Flowsheet Row Video Visit from 08/21/2022 in [place] at [facility]  PHQ-9 Total Score 10           08/21/2022   11:37 AM 08/07/2022    9:26 AM 07/23/2022    1:51 PM 04/22/2022    2:01 PM 04/01/2022    8:15 AM  Depression screen PHQ 2/9  Decreased Interest 1 2 0 0 3  Down, Depressed, Hopeless 3 0 0 0 0  PHQ - 2 Score 4 2 0 0 3  Altered sleeping '1 3 3  3  '$ Tired, decreased energy '2 3 2  3  '$ Change in appetite 3 3 0  0  Feeling bad or failure about yourself  0 0 0  0  Trouble concentrating 0 0  0  0  Moving slowly or fidgety/restless 0 0 0  0  Suicidal thoughts 0 0 0  0  PHQ-9 Score '10 11 5  9  '$ Difficult  doing work/chores Somewhat difficult Not difficult at all Not difficult at all  Not difficult at all  Reports stable, dealing with chronic disease.     07/23/2022    1:51 PM 07/24/2022    9:05 AM 07/26/2022    8:11 AM 08/07/2022    9:26 AM 08/21/2022   11:40 AM  Fall Risk  Falls in the past year? 0 '1  1 1  '$ Was there an injury with Fall? 0 '1  1 1  '$ Was there an injury with Fall? - Comments  knee     Fall Risk Category Calculator 0 '3  3 3  '$ Fall Risk Category (Retired) Low High     (RETIRED) Patient Fall Risk Level Moderate fall risk  Low fall risk    Patient at Risk for Falls Due to History of fall(s)   Other (Comment) No Fall Risks  Fall risk Follow up Falls evaluation completed   Falls evaluation completed Falls evaluation completed   Using lots of precautions.   SUMMARY AND PLAN:  Medicare annual wellness visit, subsequent  Discussed applicable health maintenance/preventive health measures and advised and referred or ordered per patient preferences:  Health Maintenance  Topic Date Due   Zoster Vaccines- Shingrix (1 of 2) Never done, she plans to get once better   COVID-19 Vaccine (4 - 2023-24 season) 03/21/2022, she plans to get once better   Medicare Annual Wellness (AWV)  08/22/2023   COLONOSCOPY (Pts 45-77yr Insurance coverage will need to be confirmed)  10/01/2024   DTaP/Tdap/Td (2 - Td or Tdap) 06/19/2025   Pneumonia Vaccine 77 Years old  Completed   INFLUENZA VACCINE  Completed   DEXA SCAN  Completed   HPV VACCINES  Aged Out   Hepatitis C Screening  Discontinued    Education and counseling on the following was provided based on the above review of health and a plan/checklist for the patient, along with additional information discussed, was provided for the patient in the patient instructions :  -Advised on importance of and resources for completing advanced directives -Provided counseling and plan for increased risk of falling if applicable per above screening. Discussed  balance exercises she can do and how to do safely at home.  -Advised and counseled on maintaining healthy weight and healthy lifestyle - including the importance of a health diet, regular physical activity, social connections and stress management. -Advised and counseled on a whole foods based healthy diet and regular exercise: discussed a heart healthy whole foods based diet at length. Recommended regular exercise and discussed options within the community.   Follow up: see patient instructions     Patient Instructions  I really enjoyed getting to talk with you today! I am available on Tuesdays and Thursdays for virtual visits if you have any questions or concerns, or if I can be of any further assistance.   CHECKLIST FROM ANNUAL WELLNESS VISIT:  -Follow up (please call to schedule if not scheduled after visit): -yearly for annual wellness visit with primary care office  Here is a list of your preventive care/health maintenance measures and the plan for each if any are due:  Health Maintenance  Topic Date Due   Zoster Vaccines- Shingrix (1 of 2) Never done   COVID-19 Vaccine (4 - 2023-24 season) 03/21/2022   Medicare Annual Wellness (  AWV)  08/22/2023   COLONOSCOPY (Pts 45-37yr Insurance coverage will need to be confirmed)  10/01/2024   DTaP/Tdap/Td (2 - Td or Tdap) 06/19/2025   Pneumonia Vaccine 77 Years old  Completed   INFLUENZA VACCINE  Completed   DEXA SCAN  Completed   HPV VACCINES  Aged Out   Hepatitis C Screening  Discontinued    -See a dentist at least yearly  -Get your eyes checked and then per your eye specialist's recommendations  -Other issues addressed today:   -I have included below further information regarding a healthy whole foods based diet, physical activity guidelines for adults, stress management and opportunities for social connections. I hope you find this information useful.      NUTRITION: -eat real food: lots of colorful vegetables (half the plate) and fruits -5-7 servings of vegetables and fruits per day (fresh or steamed is best), exp. 2 servings of vegetables with lunch and dinner and 2 servings of fruit per day. Berries and greens such as kale and collards are great choices.  -consume on a regular basis: whole grains (make sure first ingredient on label contains the word "whole"), fresh fruits, fish, nuts, seeds, healthy oils (such as olive oil, avocado oil, grape seed oil) -may eat small amounts of dairy and lean meat on occasion, but avoid processed meats such as ham, bacon, lunch meat, etc. -drink water -try to avoid fast food and pre-packaged foods, processed meat -most experts advise limiting sodium to < '2300mg'$  per day, should limit further is any chronic conditions such as high blood pressure, heart disease, diabetes, etc. The American Heart Association advised that < '1500mg'$  is is ideal -try to avoid foods that contain any ingredients with names you do not recognize  -try to avoid sugar/sweets (except for the natural sugar that occurs in fresh fruit) -try to avoid sweet drinks -try to avoid white rice, white bread, pasta (unless whole grain), white or yellow potatoes  EXERCISE GUIDELINES FOR ADULTS: -if you wish to increase your physical activity, do so gradually and with the approval of your doctors, consider requesting physical therapy once blood pressure and fistula issues are resolved -STOP and seek medical care immediately if you have any chest pain, chest discomfort or trouble breathing when starting or increasing exercise  -move and stretch your body, legs, feet and arms when sitting for long  periods -Physical activity guidelines for optimal health in adults: -least 150 minutes per week of aerobic exercise (can talk, but not sing) once approved by your doctor, 20-30 minutes of sustained activity or two 10 minute episodes of sustained activity every day.  -resistance training at least 2 days per week if approved by your doctor -balance exercises 3+ days per week:   Stand somewhere where you have something sturdy to hold onto if you lose balance.    1) lift up on toes, start with 5x per day and work up to 20x   2) stand and lift on leg straight out to the side so that foot is a few inches of the floor, start with 5x each side and work up to 20x each side   3) stand on one foot, start with 5 seconds each side and work up to 20 seconds on each side  If you need ideas or help with getting more active:  -Silver sneakers https://tools.silversneakers.com  -Walk with a Doc: Hhttp://stephens-thompson.biz/ -try to include resistance (weight lifting/strength building) and balance exercises twice per week: or the following link for ideas: hChessContest.fr  UpdateClothing.com.cy  STRESS MANAGEMENT: -can try meditating, or just sitting quietly with deep breathing while intentionally relaxing all parts of your body for 5 minutes daily -if you need further help with stress, anxiety or depression please follow up with your primary doctor or contact the wonderful folks at [facility]: [place]: -options in [place] if you wish to engage in more social and exercise related activities:  -Silver sneakers https://tools.silversneakers.com  -Walk with a Doc: http://stephens-thompson.biz/  -Check out the [place] 50+ section on the [place] [employer] (hiking clubs, book clubs, cards and games, chess, exercise classes, aquatic classes and much more) -  see the website for details: https://www.[place].gov/departments/parks-recreation/active-adults50  -YouTube has lots of exercise videos for different ages and abilities as well  -[place] (a variety of indoor and outdoor inperson activities for adults). [phone]. [address]  -Virtual Online Classes (a variety of topics): see seniorplanet.org or call [phone]  -consider volunteering at a school, hospice center, church, senior center or elsewhere           Lucretia Kern, DO

## 2022-08-21 NOTE — Patient Instructions (Addendum)
I really enjoyed getting to talk with you today! I am available on Tuesdays and Thursdays for virtual visits if you have any questions or concerns, or if I can be of any further assistance.   CHECKLIST FROM ANNUAL WELLNESS VISIT:  -Follow up (please call to schedule if not scheduled after visit): -yearly for annual wellness visit with primary care office  Here is a list of your preventive care/health maintenance measures and the plan for each if any are due:  Health Maintenance  Topic Date Due   Zoster Vaccines- Shingrix (1 of 2) Never done   COVID-19 Vaccine (4 - 2023-24 season) 03/21/2022   Medicare Annual Wellness (AWV)  08/22/2023   COLONOSCOPY (Pts 45-24yr Insurance coverage will need to be confirmed)  10/01/2024   DTaP/Tdap/Td (2 - Td or Tdap) 06/19/2025   Pneumonia Vaccine 77 Years old  Completed   INFLUENZA VACCINE  Completed   DEXA SCAN  Completed   HPV VACCINES  Aged Out   Hepatitis C Screening  Discontinued    -See a dentist at least yearly  -Get your eyes checked and then per your eye specialist's recommendations  -Other issues addressed today:   -I have included below further information regarding a healthy whole foods based diet, physical activity guidelines for adults, stress management and opportunities for social connections. I hope you find this information useful.     NUTRITION: -eat real food: lots of colorful vegetables (half the plate) and fruits -5-7 servings of vegetables and fruits per day (fresh or steamed is best), exp. 2 servings of vegetables with lunch and dinner and 2 servings of fruit per day. Berries and greens such as kale and collards are great choices.  -consume on a regular basis: whole grains  (make sure first ingredient on label contains the word "whole"), fresh fruits, fish, nuts, seeds, healthy oils (such as olive oil, avocado oil, grape seed oil) -may eat small amounts of dairy and lean meat on occasion, but avoid processed meats such as ham, bacon, lunch meat, etc. -drink water -try to avoid fast food and pre-packaged foods, processed meat -most experts advise limiting sodium to < '2300mg'$  per day, should limit further is any chronic conditions such as high blood pressure, heart disease, diabetes, etc. The American Heart Association advised that < '1500mg'$  is is ideal -try to avoid foods that contain any ingredients with names you do not recognize  -try to avoid sugar/sweets (except for the natural sugar that occurs in fresh fruit) -try to avoid sweet drinks -try to avoid white rice, white bread, pasta (unless whole grain), white or yellow potatoes  EXERCISE GUIDELINES FOR ADULTS: -if you wish to increase your physical activity, do so gradually and with the approval of your doctors, consider requesting physical therapy once blood pressure and fistula issues are resolved -STOP and seek medical care immediately if you have any chest pain, chest discomfort or trouble breathing when starting or increasing exercise  -move and stretch your body, legs, feet and arms when sitting for long periods -Physical activity guidelines for optimal health in adults: -least 150 minutes per week of aerobic exercise (can talk, but not sing) once approved by your doctor, 20-30 minutes of sustained activity or two 10 minute episodes of sustained activity every day.  -resistance training at least 2 days per week if approved by your doctor -balance exercises 3+ days per week:   Stand somewhere where you have something sturdy to hold onto if you lose balance.    1)  lift up on toes, start with 5x per day and work up to 20x   2) stand and lift on leg straight out to the side so that foot is a few inches of the  floor, start with 5x each side and work up to 20x each side   3) stand on one foot, start with 5 seconds each side and work up to 20 seconds on each side  If you need ideas or help with getting more active:  -Silver sneakers https://tools.silversneakers.com  -Walk with a Doc: http://stephens-thompson.biz/  -try to include resistance (weight lifting/strength building) and balance exercises twice per week: or the following link for ideas: ChessContest.fr  UpdateClothing.com.cy  STRESS MANAGEMENT: -can try meditating, or just sitting quietly with deep breathing while intentionally relaxing all parts of your body for 5 minutes daily -if you need further help with stress, anxiety or depression please follow up with your primary doctor or contact the wonderful folks at [place]: [place]: -options in [place] if you wish to engage in more social and exercise related activities:  -Silver sneakers https://tools.silversneakers.com  -Walk with a Doc: http://stephens-thompson.biz/  -Check out the [place] 50+ section on the [place] [employer] (hiking clubs, book clubs, cards and games, chess, exercise classes, aquatic classes and much more) - see the website for details: https://www.[place].gov/departments/parks-recreation/active-adults50  -YouTube has lots of exercise videos for different ages and abilities as well  -[place] (a variety of indoor and outdoor inperson activities for adults). [phone]. [address]  -Virtual Online Classes (a variety of topics): see seniorplanet.org or call [phone]  -consider volunteering at a school, hospice center, church, senior center or elsewhere

## 2022-08-22 ENCOUNTER — Ambulatory Visit (INDEPENDENT_AMBULATORY_CARE_PROVIDER_SITE_OTHER)
Admission: RE | Admit: 2022-08-22 | Discharge: 2022-08-22 | Disposition: A | Discharge status: Home / Self Care | Payer: Medicare Other | Source: Ambulatory Visit | Attending: Vascular Surgery | Admitting: Vascular Surgery

## 2022-08-22 ENCOUNTER — Ambulatory Visit (HOSPITAL_COMMUNITY)
Admission: RE | Admit: 2022-08-22 | Discharge: 2022-08-22 | Disposition: A | Discharge status: Home / Self Care | Payer: Medicare Other | Source: Ambulatory Visit | Attending: Vascular Surgery | Admitting: Vascular Surgery

## 2022-08-22 ENCOUNTER — Ambulatory Visit (INDEPENDENT_AMBULATORY_CARE_PROVIDER_SITE_OTHER): Payer: Medicare Other | Admitting: Physician Assistant

## 2022-08-22 VITALS — BP 149/49 | HR 65 | Temp 98.6°F | Resp 20 | Ht 65.0 in | Wt 187.4 lb

## 2022-08-22 DIAGNOSIS — N186 End stage renal disease: Secondary | ICD-10-CM

## 2022-08-22 DIAGNOSIS — N2581 Secondary hyperparathyroidism of renal origin: Secondary | ICD-10-CM | POA: Diagnosis not present

## 2022-08-22 DIAGNOSIS — Z992 Dependence on renal dialysis: Secondary | ICD-10-CM | POA: Diagnosis not present

## 2022-08-22 DIAGNOSIS — D509 Iron deficiency anemia, unspecified: Secondary | ICD-10-CM | POA: Diagnosis not present

## 2022-08-22 DIAGNOSIS — D631 Anemia in chronic kidney disease: Secondary | ICD-10-CM | POA: Diagnosis not present

## 2022-08-22 NOTE — H&P (View-Only) (Signed)
[facility] [place] HISTORY AND PHYSICAL   History of Present Illness:  Patient is a 77 y.o. year old female who presents for placement of a permanent hemodialysis access. She has history of ESRD.Marland Kitchen Her first access was in the left UE she states a graft was placed in [place].  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  She has a right radiocephalic AV fistula that was previously revised by Dr. Donzetta Matters in 2021.  She states she has noticed this aneurysm for several years.   Fistulagram by Dr. Carlis Abbott Findings:The cephalic vein is aneurysmal and patent in the forearm.  The cephalic vein stents in the upper arm are occluded as previously demonstrated.   The cephalic fistula was ligated and placement of TDC left IJ by Dr.Robins on 07/26/22 due to hemorrhage with spontaneous rupture.  She is here today for incision check and to plan new access.   She denise steal symptoms on there right UE.  Pt abx changed for cover recent wound culture sensitivities.   Cipro 7 days BID.  She denise fever and chills.     Past Medical History:  Diagnosis Date   Acid reflux    Anemia of chronic disease    Arthritis    Asthma    AVM (arteriovenous malformation)    Bilateral carotid bruits    Diverticulitis    Duodenal ulcer    ESRD (end stage renal disease) ([place])    MWF [place]   Hemorrhoids    History of blood transfusion    Hypertension    Malaise and fatigue    Orthostatic hypotension    PAF (paroxysmal atrial fibrillation) ([place])    Presence of Watchman left atrial appendage closure device 02/06/2022   Watchman FLX 60m with Dr. CBurt Knack  Sleep apnea    Syncope    Tubulovillous adenoma of colon     Past Surgical History:  Procedure Laterality Date   A/V FISTULAGRAM Right 10/18/2020   Procedure: A/V FISTULAGRAM;  Surgeon: CMarty Heck MD;  [facility];  Service: Cardiovascular;  Laterality: Right;   A/V  FISTULAGRAM Right 09/05/2021   Procedure: A/V Fistulagram;  Surgeon: CMarty Heck MD;  [facility];  Service: Cardiovascular;  Laterality: Right;   AV FISTULA PLACEMENT     BACK SURGERY     Lumbar fusion L 4 and L 5   BIOPSY  01/09/2020   Procedure: BIOPSY;  Surgeon: MRush LandmarkGTelford Nab, MD;  [facility]  Service: Gastroenterology;;   BIOPSY  01/31/2021   Procedure: BIOPSY;  Surgeon: MIrving Copas, MD;  [facility]  Service: Gastroenterology;;   BIOPSY  09/26/2021   Procedure: BIOPSY;  Surgeon: BThornton Park MD;  [facility]  Service: Gastroenterology;;   COLONOSCOPY     COLONOSCOPY N/A 02/01/2021   Procedure: COLONOSCOPY;  Surgeon: AYetta Flock MD;  [facility]  Service: Gastroenterology;  Laterality: N/A;   COLONOSCOPY WITH PROPOFOL N/A 09/26/2021   Procedure: COLONOSCOPY WITH PROPOFOL;  Surgeon: BThornton Park MD;  [facility] [facility]  Service: Gastroenterology;  Laterality: N/A;   COLONOSCOPY WITH PROPOFOL N/A 09/29/2021   Procedure: COLONOSCOPY WITH PROPOFOL;  Surgeon: BThornton Park MD;  [facility] [facility]  Service: Gastroenterology;  Laterality: N/A;   COLONOSCOPY WITH PROPOFOL N/A 10/01/2021   Procedure: COLONOSCOPY WITH PROPOFOL;  Surgeon: NMauri Pole MD;  [facility]  Service: Gastroenterology;  Laterality: N/A;   ENDOSCOPIC MUCOSAL RESECTION N/A 01/09/2020   Procedure: ENDOSCOPIC MUCOSAL RESECTION;  Surgeon: Rush Landmark Telford Nab., MD;  Location: [place];  Service: Gastroenterology;  Laterality: N/A;   ENDOSCOPIC MUCOSAL RESECTION N/A 01/31/2021   Procedure: ENDOSCOPIC MUCOSAL RESECTION;  Surgeon: Rush Landmark Telford Nab., MD;  Location: [place];  Service: Gastroenterology;  Laterality: N/A;   ENTEROSCOPY N/A 09/26/2021   Procedure: ENTEROSCOPY;  Surgeon: Thornton Park, MD;  Location: [place];  Service: Gastroenterology;  Laterality: N/A;    ESOPHAGOGASTRODUODENOSCOPY     ESOPHAGOGASTRODUODENOSCOPY (EGD) WITH PROPOFOL N/A 01/09/2020   Procedure: ESOPHAGOGASTRODUODENOSCOPY (EGD) WITH PROPOFOL;  Surgeon: Rush Landmark Telford Nab., MD;  Location: [place];  Service: Gastroenterology;  Laterality: N/A;   ESOPHAGOGASTRODUODENOSCOPY (EGD) WITH PROPOFOL N/A 01/13/2020   Procedure: ESOPHAGOGASTRODUODENOSCOPY (EGD) WITH PROPOFOL;  Surgeon: Irene Shipper, MD;  Location: [place];  Service: Gastroenterology;  Laterality: N/A;   ESOPHAGOGASTRODUODENOSCOPY (EGD) WITH PROPOFOL N/A 01/31/2021   Procedure: ESOPHAGOGASTRODUODENOSCOPY (EGD) WITH PROPOFOL;  Surgeon: Rush Landmark Telford Nab., MD;  Location: [place];  Service: Gastroenterology;  Laterality: N/A;   EUS N/A 01/09/2020   Procedure: UPPER ENDOSCOPIC ULTRASOUND (EUS) RADIAL;  Surgeon: Irving Copas., MD;  Location: [place];  Service: Gastroenterology;  Laterality: N/A;   FALSE ANEURYSM REPAIR Right 07/26/2022   Procedure: REPAIR RIGHT RADIO-CEPHALIC FISTULA ANEURYSM;  Surgeon: Broadus John, MD;  Location: [place];  Service: Vascular;  Laterality: Right;   GIVENS CAPSULE STUDY  09/29/2021   Procedure: GIVENS CAPSULE STUDY;  Surgeon: Thornton Park, MD;  Location: [place];  Service: Gastroenterology;;   HEMOSTASIS CLIP PLACEMENT  01/09/2020   Procedure: HEMOSTASIS CLIP PLACEMENT;  Surgeon: Irving Copas., MD;  Location: [place];  Service: Gastroenterology;;   HEMOSTASIS CLIP PLACEMENT  01/31/2021   Procedure: HEMOSTASIS CLIP PLACEMENT;  Surgeon: Irving Copas., MD;  Location: [place];  Service: Gastroenterology;;   HEMOSTASIS CONTROL  01/13/2020   Procedure: HEMOSTASIS CONTROL;  Surgeon: Irene Shipper, MD;  Location: [place];  Service: Gastroenterology;;  hemaspray   HOT HEMOSTASIS N/A 01/13/2020   Procedure: HOT HEMOSTASIS (ARGON PLASMA COAGULATION/BICAP);  Surgeon: Irene Shipper, MD;  Location: [place];  Service:  Gastroenterology;  Laterality: N/A;   HOT HEMOSTASIS N/A 02/01/2021   Procedure: HOT HEMOSTASIS (ARGON PLASMA COAGULATION/BICAP);  Surgeon: Yetta Flock, MD;  Location: [facility]COPY;  Service: Gastroenterology;  Laterality: N/A;   HOT HEMOSTASIS N/A 10/01/2021   Procedure: HOT HEMOSTASIS (ARGON PLASMA COAGULATION/BICAP);  Surgeon: Mauri Pole, MD;  Location: [place];  Service: Gastroenterology;  Laterality: N/A;   INSERTION OF DIALYSIS CATHETER Left 07/26/2022   Procedure: INSERTION OF TUNNELED DIALYSIS CATHETER;  Surgeon: Broadus John, MD;  Location: [facility];  Service: Vascular;  Laterality: Left;   IR GENERIC HISTORICAL  07/09/2016   IR US GUIDE VASC ACCESS RIGHT 07/09/2016 Arne Cleveland, MD [facility]   IR GENERIC HISTORICAL  07/09/2016   IR FLUORO GUIDE CV LINE RIGHT 07/09/2016 Arne Cleveland, MD [facility]   KNEE ARTHROPLASTY Left    LAPAROSCOPIC SIGMOID COLECTOMY N/A 07/11/2016   Procedure: LAPAROSCOPIC SIGMOID COLECTOMY;  Surgeon: Clovis Riley, MD;  Location: [place];  Service: General;  Laterality: N/A;   LEFT ATRIAL APPENDAGE OCCLUSION N/A 02/06/2022   Procedure: LEFT ATRIAL APPENDAGE OCCLUSION;  Surgeon: Sherren Mocha, MD;  Location: [place] [facility];  Service: Cardiovascular;  Laterality: N/A;   LIGATION OF ARTERIOVENOUS  FISTULA Right 07/26/2022   Procedure: LIGATION OF RIGHT RADIO-CEPHALIC ARTERIOVENOUS  FISTULA;  Surgeon: Virl Cagey,  Carolann Littler, MD;  Location: [place];  Service: Vascular;  Laterality: Right;   MASS EXCISION Right 04/10/2020   Procedure: EXCISION SKIN NODULE RIGHT FOREARM;  Surgeon: Waynetta Sandy, MD;  Location: [place];  Service: Vascular;  Laterality: Right;   POLYPECTOMY  09/26/2021   Procedure: POLYPECTOMY;  Surgeon: Thornton Park, MD;  Location: [place];  Service: Gastroenterology;;   POLYPECTOMY  10/01/2021   Procedure: POLYPECTOMY;  Surgeon: Mauri Pole, MD;  Location: [place];  Service: Gastroenterology;;    REVISON OF ARTERIOVENOUS FISTULA Right 07/04/2022   Procedure: REVISON OF RIGHT FOREARM ARTERIOVENOUS FISTULA;  Surgeon: Cherre Robins, MD;  Location: [place];  Service: Vascular;  Laterality: Right;   SCLEROTHERAPY  01/09/2020   Procedure: Clide Deutscher;  Surgeon: Mansouraty, Telford Nab., MD;  Location: [place];  Service: Gastroenterology;;   Clide Deutscher  01/13/2020   Procedure: Clide Deutscher;  Surgeon: Irene Shipper, MD;  Location: [place];  Service: Gastroenterology;;   Maryagnes Amos INJECTION  01/09/2020   Procedure: SUBMUCOSAL LIFTING INJECTION;  Surgeon: Irving Copas., MD;  Location: [place];  Service: Gastroenterology;;   TEE WITHOUT CARDIOVERSION N/A 02/06/2022   Procedure: TRANSESOPHAGEAL ECHOCARDIOGRAM (TEE);  Surgeon: Sherren Mocha, MD;  Location: [place] [facility];  Service: Cardiovascular;  Laterality: N/A;   TUBAL LIGATION       Social History Social History   Tobacco Use   Smoking status: Never    Passive exposure: Never   Smokeless tobacco: Never  Vaping Use   Vaping Use: Never used  Substance Use Topics   Alcohol use: No   Drug use: No    Family History Family History  Problem Relation Age of Onset   Heart failure Mother    Stroke Mother    Other Father    Colon cancer Neg Hx    Liver disease Neg Hx    Esophageal cancer Neg Hx    Stomach cancer Neg Hx    Inflammatory bowel disease Neg Hx    Rectal cancer Neg Hx    Pancreatic cancer Neg Hx     Allergies  Allergies  Allergen Reactions   Latex Rash   Penicillins Other (See Comments)    Yeast infection / Childhood   Sulfa Antibiotics Rash   Tape Other (See Comments)    Plastic, silicone, and paper tape causes bruising and pulls off skin. Cloth tape works fine   Reglan [Metoclopramide] Itching    Itchiness, bug crawling sensation      Current Outpatient Medications  Medication Sig Dispense Refill   atorvastatin (LIPITOR) 10 MG tablet Take 1 tablet (10 mg total) by  mouth daily. 90 tablet 1   carvedilol (COREG) 12.5 MG tablet Take 1 tablet (12.5 mg total) by mouth 2 (two) times daily with a meal. 180 tablet 1   cinacalcet (SENSIPAR) 30 MG tablet Take 30 mg by mouth every Monday, Wednesday, and Friday with hemodialysis. Post dialysis     diltiazem (CARDIZEM) 60 MG tablet Take 1 tablet (60 mg total) by mouth 3 (three) times daily as needed (Atrial fibrillation). 30 tablet 1   famotidine (PEPCID) 40 MG tablet Take 1 tablet (40 mg total) by mouth 2 (two) times daily. 180 tablet 1   hydrALAZINE (APRESOLINE) 25 MG tablet Take 25 mg by mouth 3 (three) times daily.     lansoprazole (PREVACID) 30 MG capsule Take 1 capsule (30 mg total) by mouth 2 (two) times daily. 60 capsule 2   LINZESS 72 MCG capsule TAKE ONE  CAPSULE BY MOUTH BEFORE BREAKFAST 30 capsule 2   Methoxy PEG-Epoetin Beta (MIRCERA IJ) Inject as directed.     sevelamer carbonate (RENVELA) 800 MG tablet Take 1,600 mg by mouth in the morning and at bedtime. With meals take 800 mg with snack     Current Facility-Administered Medications  Medication Dose Route Frequency Provider Last Rate Last Admin   betamethasone acetate-betamethasone sodium phosphate (CELESTONE) injection 12 mg  12 mg Intramuscular Once Kirsteins, Luanna Salk, MD       lidocaine (XYLOCAINE) 1 % (with pres) injection 4 mL  4 mL Other Once Kirsteins, Luanna Salk, MD        ROS:   General:  No weight loss, Fever, chills  HEENT: No recent headaches, no nasal bleeding, no visual changes, no sore throat  Neurologic: No dizziness, blackouts, seizures. No recent symptoms of stroke or mini- stroke. No recent episodes of slurred speech, or temporary blindness.  Cardiac: No recent episodes of chest pain/pressure, no shortness of breath at rest.  No shortness of breath with exertion.  Denies history of atrial fibrillation or irregular heartbeat  Vascular: No history of rest pain in feet.  No history of claudication.  No history of non-healing ulcer,  No history of DVT   Pulmonary: No home oxygen, no productive cough, no hemoptysis,  No asthma or wheezing  Musculoskeletal:  [ ]$  Arthritis, [ ]$  Low back pain,  [ ]$  Joint pain  Hematologic:No history of hypercoagulable state.  No history of easy bleeding.  No history of anemia  Gastrointestinal: No hematochezia or melena,  No gastroesophageal reflux, no trouble swallowing  Urinary: [ ]$  chronic Kidney disease, [x ] on HD - [ ]$  MWF or [ ]$  TTHS, [ ]$  Burning with urination, [ ]$  Frequent urination, [ ]$  Difficulty urinating;   Skin: No rashes  Psychological: No history of anxiety,  No history of depression   Physical Examination  Vitals:   08/22/22 1406  BP: (!) 149/49  Pulse: 65  Resp: 20  Temp: 98.6 F (37 C)  TempSrc: Temporal  SpO2: 99%  Weight: 187 lb 6.3 oz (85 kg)  Height: 5' 5"$  (1.651 m)    Body mass index is 31.18 kg/m.  General:  Alert and oriented, no acute distress HEENT: Normal Neck: No bruit or JVD Pulmonary: Clear to auscultation bilaterally Cardiac: Regular Rate and Rhythm without murmur Gastrointestinal: Soft, non-tender, non-distended, no mass, no scars Skin: No rash, right arm incision well healed.  Staples removed today patient tolerated this well. Extremity Pulses:  2+ radial, brachial pulses bilaterally Musculoskeletal: No deformity or edema  Neurologic: Upper and lower extremity motor 5/5 and symmetric  DATA:   Right Pre-Dialysis Findings:  +-----------------------+----------+--------------------+----------+-------  -+  Location              PSV (cm/s)Intralum. Diam. (cm)Waveform   Comments  +-----------------------+----------+--------------------+----------+-------  -+  Brachial Antecub. [id number]        0.52                biphasic             +-----------------------+----------+--------------------+----------+-------  -+  Radial Art at Wrist    23        0.34                monophasic            +-----------------------+----------+--------------------+----------+-------    Left Pre-Dialysis Findings:  +-----------------------+----------+--------------------+---------+--------  +  Location  PSV (cm/s)Intralum. Diam. (cm)Waveform  Comments  +-----------------------+----------+--------------------+---------+--------  +  Brachial Antecub. [id number]        0.47                triphasic           +-----------------------+----------+--------------------+---------+--------  +  Radial Art at Wrist    66        0.25                triphasic           +-----------------------+----------+--------------------+---------+--------  +  Ulnar Art at Wrist     61        0.18                triphasic           +-----------------------+----------+--------------------+---------+--------  +        Summary:    Right: No obstruction visualized in the right upper extremity.  Left: No obstruction visualized in the left upper extremity.   --------------+-------------+----------+----------------+  Right CephalicDiameter (cm)Depth (cm)    Findings      +--------------+-------------+----------+----------------+  Prox upper arm                       no flow observed  +--------------+-------------+----------+----------------+  Mid upper arm                        no flow observed  +--------------+-------------+----------+----------------+  Dist upper arm                       no flow observed  +--------------+-------------+----------+----------------+   +-----------------+-------------+----------+--------+  Right Basilic    Diameter (cm)Depth (cm)Findings  +-----------------+-------------+----------+--------+  Prox upper arm       0.27                         +-----------------+-------------+----------+--------+  Mid upper arm        0.25                         +-----------------+-------------+----------+--------+  Dist  upper arm       0.24               tortuous  +-----------------+-------------+----------+--------+  Antecubital fossa    0.17                         +-----------------+-------------+----------+--------+   +--------------+-------------+----------+--------+  Left Cephalic Diameter (cm)Depth (cm)Findings  +--------------+-------------+----------+--------+  Prox upper arm                       occluded  +--------------+-------------+----------+--------+  Mid upper arm                        occluded  +--------------+-------------+----------+--------+  Dist upper arm                       occluded  +--------------+-------------+----------+--------+   +-----------------+-------------+----------+--------+  Left Basilic     Diameter (cm)Depth (cm)Findings  +-----------------+-------------+----------+--------+  Prox upper arm       0.12                         +-----------------+-------------+----------+--------+  Mid upper arm        0.20                         +-----------------+-------------+----------+--------+  Dist upper arm       0.21                         +-----------------+-------------+----------+--------+  Antecubital fossa    0.18                         +-----------------+-------------+----------+--------+   Summary: Right: No color flow observed within the stented cephalic vein.         Patent basilic vein.  Left: Occluded cephalic vein.        Patent basilic vein.   ASSESSMENT/PLAN: ESRD s/p right forearm plication, return to ED with spontaneous Hemorrhage and ligation of the right forearm fistula. TDC left IJ currently on HD. Her arterial inflow is best on the left UE.  She states she has an old graft on the left UE.  I reviewed the duplex and vein mapping with the lab tech.  There is no visible evidence of retained graft on the left UE.  I will schedule her for left UE AV graft.  He basilic is small and her cephalic  veins are occluded B UE.       Roxy Horseman PA-C [facility] [place] Office: [phone]  MD on call Virl Cagey

## 2022-08-22 NOTE — Progress Notes (Unsigned)
[facility] [place] HISTORY AND PHYSICAL   History of Present Illness:  Patient is a 77 y.o. year old female who presents for placement of a permanent hemodialysis access. She has history of ESRD.. Her first access was in the left UE she states a graft was placed in [place].  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  She has a right radiocephalic AV fistula that was previously revised by Dr. Cain in 2021.  She states she has noticed this aneurysm for several years.   Fistulagram by Dr. Clark Findings:The cephalic vein is aneurysmal and patent in the forearm.  The cephalic vein stents in the upper arm are occluded as previously demonstrated.   The cephalic fistula was ligated and placement of TDC left IJ by Dr.Robins on 07/26/22 due to hemorrhage with spontaneous rupture.  She is here today for incision check and to plan new access.   She denise steal symptoms on there right UE.  Pt abx changed for cover recent wound culture sensitivities.   Cipro 7 days BID.  She denise fever and chills.     Past Medical History:  Diagnosis Date   Acid reflux    Anemia of chronic disease    Arthritis    Asthma    AVM (arteriovenous malformation)    Bilateral carotid bruits    Diverticulitis    Duodenal ulcer    ESRD (end stage renal disease) (HCC)    [facility]   Hemorrhoids    History of blood transfusion    Hypertension    Malaise and fatigue    Orthostatic hypotension    PAF (paroxysmal atrial fibrillation) (HCC)    Presence of Watchman left atrial appendage closure device 02/06/2022   Watchman FLX 27mm with Dr. Cooper   Sleep apnea    Syncope    Tubulovillous adenoma of colon     Past Surgical History:  Procedure Laterality Date   A/V FISTULAGRAM Right 10/18/2020   Procedure: A/V FISTULAGRAM;  Surgeon: Clark, Christopher J, MD;  [facility];  Service: Cardiovascular;  Laterality: Right;   A/V  FISTULAGRAM Right 09/05/2021   Procedure: A/V Fistulagram;  Surgeon: Clark, Christopher J, MD;  [facility];  Service: Cardiovascular;  Laterality: Right;   AV FISTULA PLACEMENT     BACK SURGERY     Lumbar fusion L 4 and L 5   BIOPSY  01/09/2020   Procedure: BIOPSY;  Surgeon: Mansouraty, Gabriel Jr., MD;  [facility];  Service: Gastroenterology;;   BIOPSY  01/31/2021   Procedure: BIOPSY;  Surgeon: Mansouraty, Gabriel Jr., MD;  [facility];  Service: Gastroenterology;;   BIOPSY  09/26/2021   Procedure: BIOPSY;  Surgeon: Beavers, Kimberly, MD;  [facility];  Service: Gastroenterology;;   COLONOSCOPY     COLONOSCOPY N/A 02/01/2021   Procedure: COLONOSCOPY;  Surgeon: Armbruster, Steven P, MD;  [facility];  Service: Gastroenterology;  Laterality: N/A;   COLONOSCOPY WITH PROPOFOL N/A 09/26/2021   Procedure: COLONOSCOPY WITH PROPOFOL;  Surgeon: Beavers, Kimberly, MD;  [facility];  Service: Gastroenterology;  Laterality: N/A;   COLONOSCOPY WITH PROPOFOL N/A 09/29/2021   Procedure: COLONOSCOPY WITH PROPOFOL;  Surgeon: Beavers, Kimberly, MD;  [facility];  Service: Gastroenterology;  Laterality: N/A;   COLONOSCOPY WITH PROPOFOL N/A 10/01/2021   Procedure: COLONOSCOPY WITH PROPOFOL;  Surgeon: Nandigam, Kavitha V, MD;  [facility];  Service: Gastroenterology;    Laterality: N/A;   ENDOSCOPIC MUCOSAL RESECTION N/A 01/09/2020   Procedure: ENDOSCOPIC MUCOSAL RESECTION;  Surgeon: Mansouraty, Gabriel Jr., MD;  [facility];  Service: Gastroenterology;  Laterality: N/A;   ENDOSCOPIC MUCOSAL RESECTION N/A 01/31/2021   Procedure: ENDOSCOPIC MUCOSAL RESECTION;  Surgeon: Mansouraty, Gabriel Jr., MD;  [facility];  Service: Gastroenterology;  Laterality: N/A;   ENTEROSCOPY N/A 09/26/2021   Procedure: ENTEROSCOPY;  Surgeon: Beavers, Kimberly, MD;  [facility];  Service: Gastroenterology;  Laterality: N/A;    ESOPHAGOGASTRODUODENOSCOPY     ESOPHAGOGASTRODUODENOSCOPY (EGD) WITH PROPOFOL N/A 01/09/2020   Procedure: ESOPHAGOGASTRODUODENOSCOPY (EGD) WITH PROPOFOL;  Surgeon: Mansouraty, Gabriel Jr., MD;  [facility];  Service: Gastroenterology;  Laterality: N/A;   ESOPHAGOGASTRODUODENOSCOPY (EGD) WITH PROPOFOL N/A 01/13/2020   Procedure: ESOPHAGOGASTRODUODENOSCOPY (EGD) WITH PROPOFOL;  Surgeon: Perry, John N, MD;  [facility];  Service: Gastroenterology;  Laterality: N/A;   ESOPHAGOGASTRODUODENOSCOPY (EGD) WITH PROPOFOL N/A 01/31/2021   Procedure: ESOPHAGOGASTRODUODENOSCOPY (EGD) WITH PROPOFOL;  Surgeon: Mansouraty, Gabriel Jr., MD;  [facility];  Service: Gastroenterology;  Laterality: N/A;   EUS N/A 01/09/2020   Procedure: UPPER ENDOSCOPIC ULTRASOUND (EUS) RADIAL;  Surgeon: Mansouraty, Gabriel Jr., MD;  [facility];  Service: Gastroenterology;  Laterality: N/A;   FALSE ANEURYSM REPAIR Right 07/26/2022   Procedure: REPAIR RIGHT RADIO-CEPHALIC FISTULA ANEURYSM;  Surgeon: Robins, Joshua E, MD;  [facility];  Service: Vascular;  Laterality: Right;   GIVENS CAPSULE STUDY  09/29/2021   Procedure: GIVENS CAPSULE STUDY;  Surgeon: Beavers, Kimberly, MD;  [facility];  Service: Gastroenterology;;   HEMOSTASIS CLIP PLACEMENT  01/09/2020   Procedure: HEMOSTASIS CLIP PLACEMENT;  Surgeon: Mansouraty, Gabriel Jr., MD;  [facility];  Service: Gastroenterology;;   HEMOSTASIS CLIP PLACEMENT  01/31/2021   Procedure: HEMOSTASIS CLIP PLACEMENT;  Surgeon: Mansouraty, Gabriel Jr., MD;  [facility];  Service: Gastroenterology;;   HEMOSTASIS CONTROL  01/13/2020   Procedure: HEMOSTASIS CONTROL;  Surgeon: Perry, John N, MD;  [facility];  Service: Gastroenterology;;  hemaspray   HOT HEMOSTASIS N/A 01/13/2020   Procedure: HOT HEMOSTASIS (ARGON PLASMA COAGULATION/BICAP);  Surgeon: Perry, John N, MD;  [facility];  Service:  Gastroenterology;  Laterality: N/A;   HOT HEMOSTASIS N/A 02/01/2021   Procedure: HOT HEMOSTASIS (ARGON PLASMA COAGULATION/BICAP);  Surgeon: Armbruster, Steven P, MD;  [facility];  Service: Gastroenterology;  Laterality: N/A;   HOT HEMOSTASIS N/A 10/01/2021   Procedure: HOT HEMOSTASIS (ARGON PLASMA COAGULATION/BICAP);  Surgeon: Nandigam, Kavitha V, MD;  [facility];  Service: Gastroenterology;  Laterality: N/A;   INSERTION OF DIALYSIS CATHETER Left 07/26/2022   Procedure: INSERTION OF TUNNELED DIALYSIS CATHETER;  Surgeon: Robins, Joshua E, MD;  [facility];  Service: Vascular;  Laterality: Left;   IR GENERIC HISTORICAL  07/09/2016   IR US GUIDE VASC ACCESS RIGHT 07/09/2016 Daniel Hassell, MD [facility]   IR GENERIC HISTORICAL  07/09/2016   IR FLUORO GUIDE CV LINE RIGHT 07/09/2016 Daniel Hassell, MD [facility]   KNEE ARTHROPLASTY Left    LAPAROSCOPIC SIGMOID COLECTOMY N/A 07/11/2016   Procedure: LAPAROSCOPIC SIGMOID COLECTOMY;  Surgeon: Chelsea A Connor, MD;  [facility];  Service: General;  Laterality: N/A;   LEFT ATRIAL APPENDAGE OCCLUSION N/A 02/06/2022   Procedure: LEFT ATRIAL APPENDAGE OCCLUSION;  Surgeon: Cooper, Michael, MD;  [facility];  Service: Cardiovascular;  Laterality: N/A;   LIGATION OF ARTERIOVENOUS  FISTULA Right 07/26/2022   Procedure: LIGATION OF RIGHT RADIO-CEPHALIC ARTERIOVENOUS  FISTULA;  Surgeon: Robins,   Joshua E, MD;  [facility];  Service: Vascular;  Laterality: Right;   MASS EXCISION Right 04/10/2020   Procedure: EXCISION SKIN NODULE RIGHT FOREARM;  Surgeon: Cain, Brandon Christopher, MD;  [facility];  Service: Vascular;  Laterality: Right;   POLYPECTOMY  09/26/2021   Procedure: POLYPECTOMY;  Surgeon: Beavers, Kimberly, MD;  [facility];  Service: Gastroenterology;;   POLYPECTOMY  10/01/2021   Procedure: POLYPECTOMY;  Surgeon: Nandigam, Kavitha V, MD;  [facility];  Service: Gastroenterology;;    REVISON OF ARTERIOVENOUS FISTULA Right 07/04/2022   Procedure: REVISON OF RIGHT FOREARM ARTERIOVENOUS FISTULA;  Surgeon: Hawken, Thomas N, MD;  [facility];  Service: Vascular;  Laterality: Right;   SCLEROTHERAPY  01/09/2020   Procedure: SCLEROTHERAPY;  Surgeon: Mansouraty, Gabriel Jr., MD;  [facility];  Service: Gastroenterology;;   SCLEROTHERAPY  01/13/2020   Procedure: SCLEROTHERAPY;  Surgeon: Perry, John N, MD;  [facility];  Service: Gastroenterology;;   SUBMUCOSAL LIFTING INJECTION  01/09/2020   Procedure: SUBMUCOSAL LIFTING INJECTION;  Surgeon: Mansouraty, Gabriel Jr., MD;  [facility];  Service: Gastroenterology;;   TEE WITHOUT CARDIOVERSION N/A 02/06/2022   Procedure: TRANSESOPHAGEAL ECHOCARDIOGRAM (TEE);  Surgeon: Cooper, Michael, MD;  [facility];  Service: Cardiovascular;  Laterality: N/A;   TUBAL LIGATION       Social History Social History   Tobacco Use   Smoking status: Never    Passive exposure: Never   Smokeless tobacco: Never  Vaping Use   Vaping Use: Never used  Substance Use Topics   Alcohol use: No   Drug use: No    Family History Family History  Problem Relation Age of Onset   Heart failure Mother    Stroke Mother    Other Father    Colon cancer Neg Hx    Liver disease Neg Hx    Esophageal cancer Neg Hx    Stomach cancer Neg Hx    Inflammatory bowel disease Neg Hx    Rectal cancer Neg Hx    Pancreatic cancer Neg Hx     Allergies  Allergies  Allergen Reactions   Latex Rash   Penicillins Other (See Comments)    Yeast infection / Childhood   Sulfa Antibiotics Rash   Tape Other (See Comments)    Plastic, silicone, and paper tape causes bruising and pulls off skin. Cloth tape works fine   Reglan [Metoclopramide] Itching    Itchiness, bug crawling sensation      Current Outpatient Medications  Medication Sig Dispense Refill   atorvastatin (LIPITOR) 10 MG tablet Take 1 tablet (10 mg total) by  mouth daily. 90 tablet 1   carvedilol (COREG) 12.5 MG tablet Take 1 tablet (12.5 mg total) by mouth 2 (two) times daily with a meal. 180 tablet 1   cinacalcet (SENSIPAR) 30 MG tablet Take 30 mg by mouth every Monday, Wednesday, and Friday with hemodialysis. Post dialysis     diltiazem (CARDIZEM) 60 MG tablet Take 1 tablet (60 mg total) by mouth 3 (three) times daily as needed (Atrial fibrillation). 30 tablet 1   famotidine (PEPCID) 40 MG tablet Take 1 tablet (40 mg total) by mouth 2 (two) times daily. 180 tablet 1   hydrALAZINE (APRESOLINE) 25 MG tablet Take 25 mg by mouth 3 (three) times daily.     lansoprazole (PREVACID) 30 MG capsule Take 1 capsule (30 mg total) by mouth 2 (two) times daily. 60 capsule 2   LINZESS 72 MCG capsule TAKE ONE   CAPSULE BY MOUTH BEFORE BREAKFAST 30 capsule 2   Methoxy PEG-Epoetin Beta (MIRCERA IJ) Inject as directed.     sevelamer carbonate (RENVELA) 800 MG tablet Take 1,600 mg by mouth in the morning and at bedtime. With meals take 800 mg with snack     Current Facility-Administered Medications  Medication Dose Route Frequency Provider Last Rate Last Admin   betamethasone acetate-betamethasone sodium phosphate (CELESTONE) injection 12 mg  12 mg Intramuscular Once Kirsteins, Andrew E, MD       lidocaine (XYLOCAINE) 1 % (with pres) injection 4 mL  4 mL Other Once Kirsteins, Andrew E, MD        ROS:   General:  No weight loss, Fever, chills  HEENT: No recent headaches, no nasal bleeding, no visual changes, no sore throat  Neurologic: No dizziness, blackouts, seizures. No recent symptoms of stroke or mini- stroke. No recent episodes of slurred speech, or temporary blindness.  Cardiac: No recent episodes of chest pain/pressure, no shortness of breath at rest.  No shortness of breath with exertion.  Denies history of atrial fibrillation or irregular heartbeat  Vascular: No history of rest pain in feet.  No history of claudication.  No history of non-healing ulcer,  No history of DVT   Pulmonary: No home oxygen, no productive cough, no hemoptysis,  No asthma or wheezing  Musculoskeletal:  [ ] Arthritis, [ ] Low back pain,  [ ] Joint pain  Hematologic:No history of hypercoagulable state.  No history of easy bleeding.  No history of anemia  Gastrointestinal: No hematochezia or melena,  No gastroesophageal reflux, no trouble swallowing  Urinary: [ ] chronic Kidney disease, [x ] on HD - [ ] MWF or [ ] TTHS, [ ] Burning with urination, [ ] Frequent urination, [ ] Difficulty urinating;   Skin: No rashes  Psychological: No history of anxiety,  No history of depression   Physical Examination  Vitals:   08/22/22 1406  BP: (!) 149/49  Pulse: 65  Resp: 20  Temp: 98.6 F (37 C)  TempSrc: Temporal  SpO2: 99%  Weight: 187 lb 6.3 oz (85 kg)  Height: 5' 5" (1.651 m)    Body mass index is 31.18 kg/m.  General:  Alert and oriented, no acute distress HEENT: Normal Neck: No bruit or JVD Pulmonary: Clear to auscultation bilaterally Cardiac: Regular Rate and Rhythm without murmur Gastrointestinal: Soft, non-tender, non-distended, no mass, no scars Skin: No rash, right arm incision well healed.  Staples removed today patient tolerated this well. Extremity Pulses:  2+ radial, brachial pulses bilaterally Musculoskeletal: No deformity or edema  Neurologic: Upper and lower extremity motor 5/5 and symmetric  DATA:   Right Pre-Dialysis Findings:  +-----------------------+----------+--------------------+----------+-------  -+  Location              PSV (cm/s)Intralum. Diam. (cm)Waveform   Comments  +-----------------------+----------+--------------------+----------+-------  -+  Brachial Antecub. fossa82        0.52                biphasic             +-----------------------+----------+--------------------+----------+-------  -+  Radial Art at Wrist    23        0.34                monophasic            +-----------------------+----------+--------------------+----------+-------    Left Pre-Dialysis Findings:  +-----------------------+----------+--------------------+---------+--------  +  Location                PSV (cm/s)Intralum. Diam. (cm)Waveform  Comments  +-----------------------+----------+--------------------+---------+--------  +  Brachial Antecub. fossa75        0.47                triphasic           +-----------------------+----------+--------------------+---------+--------  +  Radial Art at Wrist    66        0.25                triphasic           +-----------------------+----------+--------------------+---------+--------  +  Ulnar Art at Wrist     61        0.18                triphasic           +-----------------------+----------+--------------------+---------+--------  +        Summary:    Right: No obstruction visualized in the right upper extremity.  Left: No obstruction visualized in the left upper extremity.   --------------+-------------+----------+----------------+  Right CephalicDiameter (cm)Depth (cm)    Findings      +--------------+-------------+----------+----------------+  Prox upper arm                       no flow observed  +--------------+-------------+----------+----------------+  Mid upper arm                        no flow observed  +--------------+-------------+----------+----------------+  Dist upper arm                       no flow observed  +--------------+-------------+----------+----------------+   +-----------------+-------------+----------+--------+  Right Basilic    Diameter (cm)Depth (cm)Findings  +-----------------+-------------+----------+--------+  Prox upper arm       0.27                         +-----------------+-------------+----------+--------+  Mid upper arm        0.25                         +-----------------+-------------+----------+--------+  Dist  upper arm       0.24               tortuous  +-----------------+-------------+----------+--------+  Antecubital fossa    0.17                         +-----------------+-------------+----------+--------+   +--------------+-------------+----------+--------+  Left Cephalic Diameter (cm)Depth (cm)Findings  +--------------+-------------+----------+--------+  Prox upper arm                       occluded  +--------------+-------------+----------+--------+  Mid upper arm                        occluded  +--------------+-------------+----------+--------+  Dist upper arm                       occluded  +--------------+-------------+----------+--------+   +-----------------+-------------+----------+--------+  Left Basilic     Diameter (cm)Depth (cm)Findings  +-----------------+-------------+----------+--------+  Prox upper arm       0.12                         +-----------------+-------------+----------+--------+  Mid upper arm        0.20                         +-----------------+-------------+----------+--------+    Dist upper arm       0.21                         +-----------------+-------------+----------+--------+  Antecubital fossa    0.18                         +-----------------+-------------+----------+--------+   Summary: Right: No color flow observed within the stented cephalic vein.         Patent basilic vein.  Left: Occluded cephalic vein.        Patent basilic vein.   ASSESSMENT/PLAN: ESRD s/p right forearm plication, return to ED with spontaneous Hemorrhage and ligation of the right forearm fistula. TDC left IJ currently on HD. Her arterial inflow is best on the left UE.  She states she has an old graft on the left UE.  I reviewed the duplex and vein mapping with the lab tech.  There is no visible evidence of retained graft on the left UE.  I will schedule her for left UE AV graft.  He basilic is small and her cephalic  veins are occluded B UE.       Tyarra Nolton Maureen Kiefer Opheim PA-C [facility] [place] Office: [phone]  MD on call Robins 

## 2022-08-25 DIAGNOSIS — N186 End stage renal disease: Secondary | ICD-10-CM | POA: Diagnosis not present

## 2022-08-25 DIAGNOSIS — Z992 Dependence on renal dialysis: Secondary | ICD-10-CM | POA: Diagnosis not present

## 2022-08-25 DIAGNOSIS — D631 Anemia in chronic kidney disease: Secondary | ICD-10-CM | POA: Diagnosis not present

## 2022-08-25 DIAGNOSIS — D509 Iron deficiency anemia, unspecified: Secondary | ICD-10-CM | POA: Diagnosis not present

## 2022-08-25 DIAGNOSIS — N2581 Secondary hyperparathyroidism of renal origin: Secondary | ICD-10-CM | POA: Diagnosis not present

## 2022-08-26 ENCOUNTER — Other Ambulatory Visit: Payer: Self-pay | Admitting: *Deleted

## 2022-08-26 DIAGNOSIS — N186 End stage renal disease: Secondary | ICD-10-CM

## 2022-08-26 DIAGNOSIS — I10 Essential (primary) hypertension: Secondary | ICD-10-CM | POA: Diagnosis not present

## 2022-08-27 DIAGNOSIS — D509 Iron deficiency anemia, unspecified: Secondary | ICD-10-CM | POA: Diagnosis not present

## 2022-08-27 DIAGNOSIS — N2581 Secondary hyperparathyroidism of renal origin: Secondary | ICD-10-CM | POA: Diagnosis not present

## 2022-08-27 DIAGNOSIS — Z992 Dependence on renal dialysis: Secondary | ICD-10-CM | POA: Diagnosis not present

## 2022-08-27 DIAGNOSIS — N186 End stage renal disease: Secondary | ICD-10-CM | POA: Diagnosis not present

## 2022-08-27 DIAGNOSIS — D631 Anemia in chronic kidney disease: Secondary | ICD-10-CM | POA: Diagnosis not present

## 2022-08-28 ENCOUNTER — Other Ambulatory Visit: Payer: Self-pay | Admitting: Physician Assistant

## 2022-08-29 ENCOUNTER — Telehealth: Payer: Self-pay

## 2022-08-29 DIAGNOSIS — D509 Iron deficiency anemia, unspecified: Secondary | ICD-10-CM | POA: Diagnosis not present

## 2022-08-29 DIAGNOSIS — D631 Anemia in chronic kidney disease: Secondary | ICD-10-CM | POA: Diagnosis not present

## 2022-08-29 DIAGNOSIS — Z992 Dependence on renal dialysis: Secondary | ICD-10-CM | POA: Diagnosis not present

## 2022-08-29 DIAGNOSIS — N186 End stage renal disease: Secondary | ICD-10-CM | POA: Diagnosis not present

## 2022-08-29 DIAGNOSIS — N2581 Secondary hyperparathyroidism of renal origin: Secondary | ICD-10-CM | POA: Diagnosis not present

## 2022-08-29 NOTE — Progress Notes (Unsigned)
Care Management & Coordination Services Pharmacy Team  Reason for Encounter: Medication coordination and delivery  Contacted patient to discuss medications and coordinate delivery from [facility]. {US HC Outreach:28874} Cycle dispensing form sent to *** for review.   Last adherence delivery date:***      Patient is due for next adherence delivery on: ***  This delivery to include: Vials  90 Days  Atorvastatin 10 mg - 1 tablet once daily Linzess 72 mcg - 1 capsule before breakfast  Famotidine 54m - 1 tablet twice daily Dialyvite 800 with zinc - take 1 tablet every morning Carvedilol 12.5 mg - 2 tablet twice daily Lansoprazole 30 - take twice daily Hydralazine 25 mg - 1 tablet 3 times daily  Patient declined the following medications this month: Diltiazem 60 mg  - 1 tablet 3 times daily as needed for Afib  Clopidogrel 75 mg - 1 tablet daily (no longer in epic, discontinued 08/08/22 TE)  {Delivery dUJ:6107908 Any concerns about your medications? {yes/no:20286}  How often do you forget or accidentally miss a dose? {Missed doses:25554}  Do you use a pillbox? {yes/no:20286}  Is patient in packaging {yes/no:20286}  If yes  What is the date on your next pill pack?  Any concerns or issues with your packaging?   Recent blood pressure readings are as follows:***  Recent blood glucose readings are as follows:***   Chart review: Recent office visits:  08/21/2022 HColin BentonDO - Medicare annual wellness visit, subsequent   08/07/2022 SGrier MittsMD - Patient was seen for thrush and additional concerns. Started magic mouthwash and Fluconazole. Discontinued Ethyl Chloride.  07/23/2022 Betty JMartiniqueMD - Patient was seen for hypertension with renal disease and additional concerns. No medication changes.   Recent consult visits:  08/22/2022 ELaurence SlatePA-C (vascular) - Patient was seen for ESRD (end stage renal disease). No medication changes.   08/14/2022 JAdrian Prows (cardiology) - Patient was seen for Presence of Watchman left atrial appendage closure device and an additional concern. Discontinued Nystatin and Tramadol.  08/08/2022 Samantha Rhyne PA-C (vascular) - Patient was seen for ESRD (end stage renal disease). No medication changes.  07/24/2022 AAlysia PennaMD (rehab) - Patient was seen for Spondylosis without myelopathy or radiculopathy, lumbar region. No medication changes.   [facility]:  [facility] catheter. Discharge date was 07/27/2022.    [facility] -amoxicillin-clavulanate (Augmentin)   traMADol (Ultram) Medication Changes at Hospital Discharge: -No medication changes. Medications Discontinued at Hospital Discharge: -No medications discontinued Medications that remain the same after Hospital Discharge:??  -All other medications will remain the same.    Medications: Outpatient Encounter Medications as of 08/29/2022  Medication Sig   atorvastatin (LIPITOR) 10 MG tablet Take 1 tablet (10 mg total) by mouth daily.   carvedilol (COREG) 12.5 MG tablet Take 1 tablet (12.5 mg total) by mouth 2 (two) times daily with a meal.   cinacalcet (SENSIPAR) 30 MG tablet Take 30 mg by mouth every Monday, Wednesday, and Friday with hemodialysis. Post dialysis   diltiazem (CARDIZEM) 60 MG tablet Take 1 tablet (60 mg total) by mouth 3 (three) times daily as needed (Atrial fibrillation).   famotidine (PEPCID) 40 MG tablet Take 1 tablet (40 mg total) by mouth 2 (two) times daily.   hydrALAZINE (APRESOLINE) 25 MG tablet Take 25 mg by mouth 3 (three) times daily.   lansoprazole (PREVACID) 30 MG capsule Take 1 capsule (30 mg  total) by mouth 2 (two) times daily.   LINZESS 72 MCG capsule TAKE ONE CAPSULE BY MOUTH BEFORE BREAKFAST   Methoxy PEG-Epoetin Beta (MIRCERA IJ) Inject as directed.   sevelamer carbonate (RENVELA) 800 MG tablet Take 1,600 mg  by mouth in the morning and at bedtime. With meals take 800 mg with snack   Facility-Administered Encounter Medications as of 08/29/2022  Medication   betamethasone acetate-betamethasone sodium phosphate (CELESTONE) injection 12 mg   lidocaine (XYLOCAINE) 1 % (with pres) injection 4 mL   BP Readings from Last 3 Encounters:  08/22/22 (!) 149/49  08/21/22 (!) 117/59  08/14/22 (!) 178/74    Pulse Readings from Last 3 Encounters:  08/22/22 65  08/14/22 68  08/08/22 61    No results found for: "HGBA1C" Lab Results  Component Value Date   CREATININE 9.90 (H) 07/26/2022   BUN 34 (H) 07/26/2022   GFR 2.96 (LL) 09/19/2021   GFRNONAA 7 (L) 07/04/2022   GFRAA 4 (L) 01/18/2020   NA 140 07/26/2022   K 4.7 07/26/2022   CALCIUM 8.7 (L) 07/04/2022   CO2 27 07/04/2022   [place] Pharmacist Assistant [phone]

## 2022-09-01 DIAGNOSIS — D631 Anemia in chronic kidney disease: Secondary | ICD-10-CM | POA: Diagnosis not present

## 2022-09-01 DIAGNOSIS — N2581 Secondary hyperparathyroidism of renal origin: Secondary | ICD-10-CM | POA: Diagnosis not present

## 2022-09-01 DIAGNOSIS — D509 Iron deficiency anemia, unspecified: Secondary | ICD-10-CM | POA: Diagnosis not present

## 2022-09-01 DIAGNOSIS — Z992 Dependence on renal dialysis: Secondary | ICD-10-CM | POA: Diagnosis not present

## 2022-09-01 DIAGNOSIS — N186 End stage renal disease: Secondary | ICD-10-CM | POA: Diagnosis not present

## 2022-09-03 ENCOUNTER — Other Ambulatory Visit: Payer: Self-pay | Admitting: Nurse Practitioner

## 2022-09-03 DIAGNOSIS — N2581 Secondary hyperparathyroidism of renal origin: Secondary | ICD-10-CM | POA: Diagnosis not present

## 2022-09-03 DIAGNOSIS — I4891 Unspecified atrial fibrillation: Secondary | ICD-10-CM | POA: Diagnosis not present

## 2022-09-03 DIAGNOSIS — K219 Gastro-esophageal reflux disease without esophagitis: Secondary | ICD-10-CM

## 2022-09-03 DIAGNOSIS — N186 End stage renal disease: Secondary | ICD-10-CM | POA: Diagnosis not present

## 2022-09-03 DIAGNOSIS — K5900 Constipation, unspecified: Secondary | ICD-10-CM

## 2022-09-03 DIAGNOSIS — D631 Anemia in chronic kidney disease: Secondary | ICD-10-CM | POA: Diagnosis not present

## 2022-09-03 DIAGNOSIS — D509 Iron deficiency anemia, unspecified: Secondary | ICD-10-CM | POA: Diagnosis not present

## 2022-09-03 DIAGNOSIS — Z992 Dependence on renal dialysis: Secondary | ICD-10-CM | POA: Diagnosis not present

## 2022-09-03 NOTE — Telephone Encounter (Signed)
Please advise 

## 2022-09-04 ENCOUNTER — Encounter: Payer: Medicare Other | Attending: Physical Medicine & Rehabilitation | Admitting: Physical Medicine & Rehabilitation

## 2022-09-04 ENCOUNTER — Encounter: Payer: Self-pay | Admitting: Physical Medicine & Rehabilitation

## 2022-09-04 VITALS — BP 147/74 | HR 68 | Ht 65.0 in | Wt 190.0 lb

## 2022-09-04 DIAGNOSIS — M47816 Spondylosis without myelopathy or radiculopathy, lumbar region: Secondary | ICD-10-CM | POA: Diagnosis not present

## 2022-09-04 MED ORDER — LIDOCAINE HCL 1 % IJ SOLN
5.0000 mL | Freq: Once | INTRAMUSCULAR | Status: AC
  Administered 2022-09-04: 5 mL

## 2022-09-04 MED ORDER — LIDOCAINE HCL (PF) 2 % IJ SOLN
2.0000 mL | Freq: Once | INTRAMUSCULAR | Status: AC
  Administered 2022-09-04: 2 mL

## 2022-09-04 NOTE — Progress Notes (Signed)
Left L5 -S1 facet  intra articular injection   Indication left-sided low back pain status post L4-5 fusion  Informed consent was obtained after describing risks and benefits of the procedure with patient these include bleeding bruise infection as well as intolerance to medications injected.  The left L5-S1 facet was visualized using oblique views with the C arm inclined to sharpen endplates of L5 and S1. 25-gauge 1.5 inch needle was used anesthetize skin and subcu tissue with 1 cc 1% lidocaine.  Then a 22-gauge 3.5 inch spinal needle was then inserted under fluoroscopic guidance targeting the inferior aspect of the joint.  0.5 mL of Omnipaque 180 was injected demonstrating good joint outline followed by injection of 1 mL of 2% lidocaine. The patient tolerated procedure well Postprocedure instructions Follow-up in 6 weeks Preinjection pain 7/10 Postinjection pain 0/10

## 2022-09-04 NOTE — Progress Notes (Signed)
  PROCEDURE RECORD [facility]   Name: Jane [name] DOB:1945-09-13 MRN: [id number]  Date:09/04/2022  Physician: [name], MD    Nurse/CMA: Andersen Iorio RMA   Allergies:  Allergies  Allergen Reactions   Latex Rash   Penicillins Other (See Comments)    Yeast infection / Childhood   Sulfa Antibiotics Rash   Tape Other (See Comments)    Plastic, silicone, and paper tape causes bruising and pulls off skin. Cloth tape works fine   Reglan [Metoclopramide] Itching    Itchiness, bug crawling sensation     Consent Signed: No.  Is patient diabetic? No.  CBG today? .  Pregnant: No. LMP: No LMP recorded. Patient is postmenopausal. (age 69-55)  Anticoagulants: no Anti-inflammatory: no Antibiotics: no  Procedure: Left L3-4-5 Medial Branch Block  Position: Prone Start Time: 11:14 am  End Time: 11:20 am  Fluoro Time: 15  RN/CMA Rayansh Herbst RMA Lee, CMA    Time 9:56 AM 11:24 am    BP 147/74 170/55    Pulse 68 76    Respirations 16 16    O2 Sat 95 98    S/S 6 6    Pain Level 7/10 0/10     D/C home with daughter , patient A & O X 3, D/C instructions reviewed, and sits independently.

## 2022-09-04 NOTE — Patient Instructions (Signed)
L5-S1 facet injection

## 2022-09-05 DIAGNOSIS — N2581 Secondary hyperparathyroidism of renal origin: Secondary | ICD-10-CM | POA: Diagnosis not present

## 2022-09-05 DIAGNOSIS — N186 End stage renal disease: Secondary | ICD-10-CM | POA: Diagnosis not present

## 2022-09-05 DIAGNOSIS — D631 Anemia in chronic kidney disease: Secondary | ICD-10-CM | POA: Diagnosis not present

## 2022-09-05 DIAGNOSIS — D509 Iron deficiency anemia, unspecified: Secondary | ICD-10-CM | POA: Diagnosis not present

## 2022-09-05 DIAGNOSIS — Z992 Dependence on renal dialysis: Secondary | ICD-10-CM | POA: Diagnosis not present

## 2022-09-08 ENCOUNTER — Encounter (HOSPITAL_COMMUNITY): Payer: Self-pay | Admitting: Vascular Surgery

## 2022-09-08 DIAGNOSIS — D509 Iron deficiency anemia, unspecified: Secondary | ICD-10-CM | POA: Diagnosis not present

## 2022-09-08 DIAGNOSIS — Z992 Dependence on renal dialysis: Secondary | ICD-10-CM | POA: Diagnosis not present

## 2022-09-08 DIAGNOSIS — N186 End stage renal disease: Secondary | ICD-10-CM | POA: Diagnosis not present

## 2022-09-08 DIAGNOSIS — N2581 Secondary hyperparathyroidism of renal origin: Secondary | ICD-10-CM | POA: Diagnosis not present

## 2022-09-08 DIAGNOSIS — D631 Anemia in chronic kidney disease: Secondary | ICD-10-CM | POA: Diagnosis not present

## 2022-09-08 NOTE — Anesthesia Preprocedure Evaluation (Signed)
Anesthesia Evaluation  Patient identified by MRN, date of birth, ID band Patient awake    Reviewed: Allergy & Precautions, H&P , NPO status , Patient's Chart, lab work & pertinent test results  Airway Mallampati: II  TM Distance: >3 FB Neck ROM: Full    Dental no notable dental hx.    Pulmonary sleep apnea    Pulmonary exam normal breath sounds clear to auscultation       Cardiovascular hypertension, Normal cardiovascular exam+ dysrhythmias Atrial Fibrillation  Rhythm:Regular Rate:Normal     Neuro/Psych negative neurological ROS  negative psych ROS   GI/Hepatic Neg liver ROS,GERD  ,,  Endo/Other  negative endocrine ROS    Renal/GU DialysisRenal disease  negative genitourinary   Musculoskeletal negative musculoskeletal ROS (+)    Abdominal   Peds negative pediatric ROS (+)  Hematology  (+) Blood dyscrasia, anemia   Anesthesia Other Findings   Reproductive/Obstetrics negative OB ROS                             Anesthesia Physical Anesthesia Plan  ASA: 3  Anesthesia Plan: MAC   Post-op Pain Management: Regional block*   Induction: Intravenous  PONV Risk Score and Plan: 2 and Ondansetron, Propofol infusion and Treatment may vary due to age or medical condition  Airway Management Planned: Simple Face Mask  Additional Equipment:   Intra-op Plan:   Post-operative Plan:   Informed Consent: I have reviewed the patients History and Physical, chart, labs and discussed the procedure including the risks, benefits and alternatives for the proposed anesthesia with the patient or authorized representative who has indicated his/her understanding and acceptance.     Dental advisory given  Plan Discussed with: CRNA and Surgeon  Anesthesia Plan Comments: (PAT note by Karoline Caldwell, PA-C: Follows with cardiologist Dr. Einar Gip for history of paroxysmal atrial fibrillation, HTN, orthostatic  hypotension, OSA not on CPAP.  Nuclear stress 08/2018 was low risk.  Echo 12/2020 showed EF 60 to 65%.  She is status post Watchman left atrial appendage closure device placement on 02/06/2022.  Last seen by Dr. Einar Gip 08/14/2022.  Noted to be greater than 6 months post watchman implant and no longer acquired ASA and Plavix.  Blood pressure elevated, she was recently restarted back on hydralazine by her nephrologist but had not yet taken morning dose prior to her appointment.  Blood pressure 178/74 at that time.  ESRD on HD Monday Wednesday Friday.  She has had multiple prior access procedures.  Per vascular surgery note 08/22/2022, "Her first access was in the left UE she states a graft was placed in [place]. 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 She has a rightradiocephalic AV fistula that was previously revised by Dr. Donzetta Matters in 2021. She states she has noticed this aneurysm for several years.Fistulagram by Dr. Carlis Abbott Findings:The cephalic vein is aneurysmal and patent in the forearm. Thecephalic veinstents in the upper arm are occludedas previously demonstrated.The cephalic fistula was ligated and placement of TDC left IJ by Dr.Robins on 07/26/22 due to hemorrhage with spontaneous rupture."  Patient will need day of surgery labs and evaluation.  EKG 08/14/2022: Normal sinus rhythm at the rate of 70 bpm, normal axis, no evidence of ischemia, normal EKG. Compared to 03/26/2022, no significant change.   Echocardiogram 12/27/2020: Normal LV systolic function with visual EF 60-65%. Left ventricle cavity is normal in size. Moderate left ventricular hypertrophy. Normal global wallmotion. Indeterminate  diastolic filling pattern, elevated LAP. No significant valvular heart disease. Compared to study dated 11/24/2017: LVEF is preserved, G2DD is now indeterminate with elevated LAP, MR and TR have improved, otherwise nosignificant change.  Carotid artery  duplex 04/24/2020: Stenosis in the right external carotid artery (<50%). Minimal stenosis in the left internal carotid artery (minimal). Stenosis in the left common carotid artery (<50%). There is mild diffuse heterogeneous plaque in bilateral carotid arteries. Antegrade right vertebral artery flow.  Follow up in one year is appropriate if clinically indicated. No significant change from 12/02/2016.  Lexiscan myoview stress test 09/06/2018:  1. Lexiscan stress test was performed. Exercise capacity was not assessed. Stress symptoms included dyspnea, dizziness. Peak effect blood pressure was 158/68 mmHg. The resting and stress electrocardiogram demonstrated normal sinus rhythm, normal resting conduction, no resting arrhythmias and normal rest repolarization. Stress EKG is non diagnostic for ischemia as it is a pharmacologic stress.  2. The overall quality of the study is good. There is no evidence of abnormal lung activity. Stress and rest SPECT images demonstrate homogeneous tracer distribution throughout the myocardium. Gated SPECT imaging reveals normal myocardial thickening and wall motion. The left ventricular ejection fraction was normal (61%).  3. Low risk study  )        Anesthesia Quick Evaluation

## 2022-09-08 NOTE — Progress Notes (Signed)
PCP - Dr [name] Cardiologist - Dr Adrian Prows  Chest x-ray - 07/26/22 EKG - 08/14/22 Stress Test - 09/06/18 ECHO TEE - 02/06/22 Cardiac Cath - n/a  ICD Pacemaker/Loop - n/a  Sleep Study -  Yes CPAP - does not need CPAP per patient  Diabetes - n/a  NPO  Anesthesia review: Yes  STOP now taking any Aspirin (unless otherwise instructed by your surgeon), Aleve, Naproxen, Ibuprofen, Motrin, Advil, Goody's, BC's, all herbal medications, fish oil, and all vitamins.   Coronavirus Screening Do you have any of the following symptoms:  Cough yes/no: No Fever (>100.56F)  yes/no: No Runny nose yes/no: No Sore throat yes/no: No Difficulty breathing/shortness of breath  yes/no: No  Have you traveled in the last 14 days and where? yes/no: No  Patient verbalized understanding of instructions that were given via phone.

## 2022-09-08 NOTE — Progress Notes (Signed)
Anesthesia Chart Review: Same-day workup  Follows with cardiologist Dr. Einar Gip for history of paroxysmal atrial fibrillation, HTN, orthostatic hypotension, OSA not on CPAP.  Nuclear stress 08/2018 was low risk.  Echo 12/2020 showed EF 60 to 65%.  She is status post Watchman left atrial appendage closure device placement on 02/06/2022.  Last seen by Dr. Einar Gip 08/14/2022.  Noted to be greater than 6 months post watchman implant and no longer acquired ASA and Plavix.  Blood pressure elevated, she was recently restarted back on hydralazine by her nephrologist but had not yet taken morning dose prior to her appointment.  Blood pressure 178/74 at that time.  ESRD on HD Monday Wednesday Friday.  She has had multiple prior access procedures.  Per vascular surgery note 08/22/2022, " Her first access was in the left UE she states a graft was placed in [place].  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  She has a right radiocephalic AV fistula that was previously revised by Dr. Donzetta Matters in 2021.  She states she has noticed this aneurysm for several years. Fistulagram by Dr. Carlis Abbott Findings:The cephalic vein is aneurysmal and patent in the forearm.  The cephalic vein stents in the upper arm are occluded as previously demonstrated.   The cephalic fistula was ligated and placement of TDC left IJ by Dr.Robins on 07/26/22 due to hemorrhage with spontaneous rupture."  Patient will need day of surgery labs and evaluation.  EKG 08/14/2022: Normal sinus rhythm at the rate of 70 bpm, normal axis, no evidence of ischemia, normal EKG. Compared to 03/26/2022, no significant change.   Echocardiogram 12/27/2020: Normal LV systolic function with visual EF 60-65%. Left ventricle cavity is normal in size. Moderate left ventricular hypertrophy. Normal global wallmotion. Indeterminate diastolic filling pattern, elevated LAP. No significant valvular heart disease. Compared to study dated  11/24/2017: LVEF is preserved, G2DD is now indeterminate with elevated LAP, MR and TR have improved, otherwise nosignificant change.  Carotid artery duplex  04/24/2020: Stenosis in the right external carotid artery (<50%). Minimal stenosis in the left internal carotid artery (minimal). Stenosis in the left common carotid artery (<50%). There is mild diffuse heterogeneous plaque in bilateral carotid arteries. Antegrade right vertebral artery flow.  Follow up in one year is appropriate if clinically indicated. No significant change from 12/02/2016.  Lexiscan myoview stress test 09/06/2018:  1. Lexiscan stress test was performed. Exercise capacity was not assessed. Stress symptoms included dyspnea, dizziness. Peak effect blood pressure was 158/68 mmHg. The resting and stress electrocardiogram demonstrated normal sinus rhythm, normal resting conduction, no resting arrhythmias and normal rest repolarization.  Stress EKG is non diagnostic for ischemia as it is a pharmacologic stress.  2. The overall quality of the study is good. There is no evidence of abnormal lung activity. Stress and rest SPECT images demonstrate homogeneous tracer distribution throughout the myocardium. Gated SPECT imaging reveals normal myocardial thickening and wall motion. The left ventricular ejection fraction was normal (61%).   3. Low risk study     Wynonia Musty [facility] Short Stay Center/Anesthesiology Phone [phone] 09/08/2022 10:32 AM

## 2022-09-09 ENCOUNTER — Other Ambulatory Visit: Payer: Self-pay

## 2022-09-09 ENCOUNTER — Ambulatory Visit (HOSPITAL_COMMUNITY)
Admission: RE | Admit: 2022-09-09 | Discharge: 2022-09-09 | Disposition: A | Discharge status: Home / Self Care | Payer: Medicare Other | Attending: Vascular Surgery | Admitting: Vascular Surgery

## 2022-09-09 ENCOUNTER — Telehealth: Payer: Self-pay

## 2022-09-09 ENCOUNTER — Encounter (HOSPITAL_COMMUNITY): Payer: Self-pay | Admitting: Vascular Surgery

## 2022-09-09 ENCOUNTER — Ambulatory Visit (HOSPITAL_BASED_OUTPATIENT_CLINIC_OR_DEPARTMENT_OTHER): Payer: Medicare Other | Admitting: Physician Assistant

## 2022-09-09 ENCOUNTER — Encounter (HOSPITAL_COMMUNITY)
Admission: RE | Disposition: A | Discharge status: Home / Self Care | Payer: Self-pay | Source: Home / Self Care | Attending: Vascular Surgery

## 2022-09-09 ENCOUNTER — Ambulatory Visit (HOSPITAL_COMMUNITY): Payer: Medicare Other | Admitting: Physician Assistant

## 2022-09-09 DIAGNOSIS — M25561 Pain in right knee: Secondary | ICD-10-CM

## 2022-09-09 DIAGNOSIS — G4730 Sleep apnea, unspecified: Secondary | ICD-10-CM | POA: Insufficient documentation

## 2022-09-09 DIAGNOSIS — I120 Hypertensive chronic kidney disease with stage 5 chronic kidney disease or end stage renal disease: Secondary | ICD-10-CM

## 2022-09-09 DIAGNOSIS — Y832 Surgical operation with anastomosis, bypass or graft as the cause of abnormal reaction of the patient, or of later complication, without mention of misadventure at the time of the procedure: Secondary | ICD-10-CM | POA: Diagnosis not present

## 2022-09-09 DIAGNOSIS — K219 Gastro-esophageal reflux disease without esophagitis: Secondary | ICD-10-CM | POA: Diagnosis not present

## 2022-09-09 DIAGNOSIS — T82868A Thrombosis of vascular prosthetic devices, implants and grafts, initial encounter: Secondary | ICD-10-CM | POA: Insufficient documentation

## 2022-09-09 DIAGNOSIS — Z992 Dependence on renal dialysis: Secondary | ICD-10-CM | POA: Diagnosis not present

## 2022-09-09 DIAGNOSIS — N185 Chronic kidney disease, stage 5: Secondary | ICD-10-CM | POA: Diagnosis not present

## 2022-09-09 DIAGNOSIS — D631 Anemia in chronic kidney disease: Secondary | ICD-10-CM

## 2022-09-09 DIAGNOSIS — I4891 Unspecified atrial fibrillation: Secondary | ICD-10-CM

## 2022-09-09 DIAGNOSIS — N186 End stage renal disease: Secondary | ICD-10-CM

## 2022-09-09 HISTORY — PX: AV FISTULA PLACEMENT: SHX1204

## 2022-09-09 LAB — POCT I-STAT, CHEM 8
BUN: 30 mg/dL — ABNORMAL HIGH (ref 8–23)
Calcium, Ion: 1.07 mmol/L — ABNORMAL LOW (ref 1.15–1.40)
Chloride: 101 mmol/L (ref 98–111)
Creatinine, Ser: 8.9 mg/dL — ABNORMAL HIGH (ref 0.44–1.00)
Glucose, Bld: 97 mg/dL (ref 70–99)
HCT: 41 % (ref 36.0–46.0)
Hemoglobin: 13.9 g/dL (ref 12.0–15.0)
Potassium: 4.1 mmol/L (ref 3.5–5.1)
Sodium: 141 mmol/L (ref 135–145)
TCO2: 29 mmol/L (ref 22–32)

## 2022-09-09 SURGERY — ARTERIOVENOUS (AV) FISTULA CREATION
Anesthesia: Monitor Anesthesia Care | Site: Arm Upper | Laterality: Left

## 2022-09-09 MED ORDER — DEXAMETHASONE SODIUM PHOSPHATE 10 MG/ML IJ SOLN
INTRAMUSCULAR | Status: DC | PRN
  Administered 2022-09-09: 10 mg via INTRAVENOUS

## 2022-09-09 MED ORDER — ONDANSETRON HCL 4 MG/2ML IJ SOLN: 4.0000 mg | Freq: Once | INTRAMUSCULAR | Status: DC | PRN

## 2022-09-09 MED ORDER — ONDANSETRON 4 MG PO TBDP
4.0000 mg | ORAL_TABLET | Freq: Once | ORAL | Status: AC
  Administered 2022-09-09: 4 mg via ORAL
  Filled 2022-09-09 (×2): qty 1

## 2022-09-09 MED ORDER — HEPARIN 6000 UNIT IRRIGATION SOLUTION
Status: DC | PRN
  Administered 2022-09-09: 1

## 2022-09-09 MED ORDER — FENTANYL CITRATE (PF) 100 MCG/2ML IJ SOLN: 25.0000 ug | INTRAMUSCULAR | Status: DC | PRN

## 2022-09-09 MED ORDER — DEXAMETHASONE SODIUM PHOSPHATE 10 MG/ML IJ SOLN
INTRAMUSCULAR | Status: AC
  Filled 2022-09-09: qty 1

## 2022-09-09 MED ORDER — MEPIVACAINE HCL (PF) 2 % IJ SOLN
INTRAMUSCULAR | Status: DC | PRN
  Administered 2022-09-09: 10 mL

## 2022-09-09 MED ORDER — CHLORHEXIDINE GLUCONATE 4 % EX LIQD: 60.0000 mL | Freq: Once | CUTANEOUS | Status: DC

## 2022-09-09 MED ORDER — HEPARIN 6000 UNIT IRRIGATION SOLUTION
Status: AC
  Filled 2022-09-09: qty 500

## 2022-09-09 MED ORDER — ONDANSETRON HCL 4 MG/2ML IJ SOLN
INTRAMUSCULAR | Status: DC | PRN
  Administered 2022-09-09: 4 mg via INTRAVENOUS

## 2022-09-09 MED ORDER — PROPOFOL 500 MG/50ML IV EMUL
INTRAVENOUS | Status: DC | PRN
  Administered 2022-09-09: 20 mg via INTRAVENOUS
  Administered 2022-09-09: 125 ug/kg/min via INTRAVENOUS

## 2022-09-09 MED ORDER — OXYCODONE HCL 5 MG PO TABS: 5.0000 mg | ORAL_TABLET | Freq: Once | ORAL | Status: DC | PRN

## 2022-09-09 MED ORDER — TRAMADOL HCL 50 MG PO TABS: 50.0000 mg | ORAL_TABLET | Freq: Four times a day (QID) | ORAL | 0 refills | Status: DC | PRN

## 2022-09-09 MED ORDER — LIDOCAINE-EPINEPHRINE (PF) 1.5 %-1:200000 IJ SOLN
INTRAMUSCULAR | Status: DC | PRN
  Administered 2022-09-09: 15 mL via PERINEURAL

## 2022-09-09 MED ORDER — OXYCODONE HCL 5 MG/5ML PO SOLN: 5.0000 mg | Freq: Once | ORAL | Status: DC | PRN

## 2022-09-09 MED ORDER — EPHEDRINE SULFATE-NACL 50-0.9 MG/10ML-% IV SOSY
PREFILLED_SYRINGE | INTRAVENOUS | Status: DC | PRN
  Administered 2022-09-09: 5 mg via INTRAVENOUS
  Administered 2022-09-09: 10 mg via INTRAVENOUS

## 2022-09-09 MED ORDER — CIPROFLOXACIN IN D5W 400 MG/200ML IV SOLN
400.0000 mg | INTRAVENOUS | Status: AC
  Administered 2022-09-09: 400 mg via INTRAVENOUS
  Filled 2022-09-09: qty 200

## 2022-09-09 MED ORDER — PHENYLEPHRINE 80 MCG/ML (10ML) SYRINGE FOR IV PUSH (FOR BLOOD PRESSURE SUPPORT)
PREFILLED_SYRINGE | INTRAVENOUS | Status: DC | PRN
  Administered 2022-09-09 (×3): 160 ug via INTRAVENOUS

## 2022-09-09 MED ORDER — MIDAZOLAM HCL 2 MG/2ML IJ SOLN
INTRAMUSCULAR | Status: AC
  Administered 2022-09-09: 1 mg
  Filled 2022-09-09: qty 2

## 2022-09-09 MED ORDER — SODIUM CHLORIDE 0.9 % IV SOLN: INTRAVENOUS | Status: DC

## 2022-09-09 MED ORDER — 0.9 % SODIUM CHLORIDE (POUR BTL) OPTIME
TOPICAL | Status: DC | PRN
  Administered 2022-09-09: 1000 mL

## 2022-09-09 MED ORDER — PHENYLEPHRINE HCL-NACL 20-0.9 MG/250ML-% IV SOLN
INTRAVENOUS | Status: DC | PRN
  Administered 2022-09-09: 30 ug/min via INTRAVENOUS

## 2022-09-09 MED ORDER — DEXMEDETOMIDINE HCL IN NACL 80 MCG/20ML IV SOLN
INTRAVENOUS | Status: AC
  Filled 2022-09-09: qty 20

## 2022-09-09 MED ORDER — FENTANYL CITRATE (PF) 100 MCG/2ML IJ SOLN
INTRAMUSCULAR | Status: AC
  Administered 2022-09-09: 50 ug
  Filled 2022-09-09: qty 2

## 2022-09-09 SURGICAL SUPPLY — 38 items
ARMBAND PINK RESTRICT EXTREMIT (MISCELLANEOUS) ×4 IMPLANT
BAG COUNTER SPONGE SURGICOUNT (BAG) ×2 IMPLANT
CANISTER SUCT 3000ML PPV (MISCELLANEOUS) ×2 IMPLANT
CLIP LIGATING EXTRA MED SLVR (CLIP) ×2 IMPLANT
CLIP LIGATING EXTRA SM BLUE (MISCELLANEOUS) ×2 IMPLANT
DERMABOND ADVANCED .7 DNX12 (GAUZE/BANDAGES/DRESSINGS) ×2 IMPLANT
DRAPE SURG 17X23 STRL (DRAPES) ×1 IMPLANT
ELECT REM PT RETURN 9FT ADLT (ELECTROSURGICAL) ×2
ELECTRODE REM PT RTRN 9FT ADLT (ELECTROSURGICAL) ×2 IMPLANT
GLOVE BIO SURGEON STRL SZ7.5 (GLOVE) ×1 IMPLANT
GLOVE BIOGEL PI IND STRL 7.5 (GLOVE) ×1 IMPLANT
GOWN STRL REUS W/ TWL LRG LVL3 (GOWN DISPOSABLE) ×4 IMPLANT
GOWN STRL REUS W/ TWL XL LVL3 (GOWN DISPOSABLE) ×2 IMPLANT
GOWN STRL REUS W/TWL LRG LVL3 (GOWN DISPOSABLE) ×4
GOWN STRL REUS W/TWL XL LVL3 (GOWN DISPOSABLE) ×2
HEMOSTAT SNOW SURGICEL 2X4 (HEMOSTASIS) IMPLANT
INSERT FOGARTY SM (MISCELLANEOUS) ×2 IMPLANT
KIT BASIN OR (CUSTOM PROCEDURE TRAY) ×2 IMPLANT
KIT TURNOVER KIT B (KITS) ×2 IMPLANT
LOOP VASCLR MAXI BLUE 18IN ST (MISCELLANEOUS) IMPLANT
LOOP VASCULAR MAXI 18 BLUE (MISCELLANEOUS) ×2
LOOPS VASCLR MAXI BLUE 18IN ST (MISCELLANEOUS) ×2 IMPLANT
NS IRRIG 1000ML POUR BTL (IV SOLUTION) ×2 IMPLANT
PACK CV ACCESS (CUSTOM PROCEDURE TRAY) ×2 IMPLANT
PAD ARMBOARD 7.5X6 YLW CONV (MISCELLANEOUS) ×4 IMPLANT
POWDER SURGICEL 3.0 GRAM (HEMOSTASIS) IMPLANT
SLING ARM FOAM STRAP LRG (SOFTGOODS) ×1 IMPLANT
SLING ARM FOAM STRAP MED (SOFTGOODS) IMPLANT
SUT MNCRL AB 4-0 PS2 18 (SUTURE) IMPLANT
SUT PROLENE 6 0 BV (SUTURE) ×3 IMPLANT
SUT SILK 2 0 SH (SUTURE) IMPLANT
SUT VIC AB 3-0 SH 27 (SUTURE) ×4
SUT VIC AB 3-0 SH 27X BRD (SUTURE) ×4 IMPLANT
SYR TOOMEY 50ML (SYRINGE) IMPLANT
TOWEL GREEN STERILE (TOWEL DISPOSABLE) ×2 IMPLANT
UNDERPAD 30X36 HEAVY ABSORB (UNDERPADS AND DIAPERS) ×2 IMPLANT
VASCULAR TIE MAXI BLUE 18IN ST (MISCELLANEOUS) ×2
WATER STERILE IRR 1000ML POUR (IV SOLUTION) ×2 IMPLANT

## 2022-09-09 NOTE — Interval H&P Note (Signed)
History and Physical Interval Note:  09/09/2022 9:24 AM  Jane [name]  has presented today for surgery, with the diagnosis of ESRD.  The various methods of treatment have been discussed with the patient and family. After consideration of risks, benefits and other options for treatment, the patient has consented to  Procedure(s): INSERTION OF ARTERIOVENOUS (AV) GORE-TEX GRAFT LEFT ARM (Left) as a surgical intervention.  The patient's history has been reviewed, patient examined, no change in status, stable for surgery.  I have reviewed the patient's chart and labs.  Questions were answered to the patient's satisfaction.     Servando Snare

## 2022-09-09 NOTE — Telephone Encounter (Signed)
Called and left a VM for patient to CB to schedule for gel injection with Dr. Erlinda Hong.  Check referral tab

## 2022-09-09 NOTE — Anesthesia Postprocedure Evaluation (Signed)
Anesthesia Post Note  Patient: Jane [name]  Procedure(s) Performed: BRACHIOBASILIC ARTERIOVENOUS (AV) FISTULA CREATION (Left: Arm Upper)     Patient location during evaluation: PACU Anesthesia Type: Regional and MAC Level of consciousness: awake and alert Pain management: pain level controlled Vital Signs Assessment: post-procedure vital signs reviewed and stable Respiratory status: spontaneous breathing, nonlabored ventilation, respiratory function stable and patient connected to nasal cannula oxygen Cardiovascular status: stable and blood pressure returned to baseline Postop Assessment: no apparent nausea or vomiting Anesthetic complications: no  No notable events documented.  Last Vitals:  Vitals:   09/09/22 0915 09/09/22 1100  BP:  (!) 96/38  Pulse: 76   Resp: (!) 21 (!) 23  Temp:  (!) 36.4 C  SpO2: 100%     Last Pain:  Vitals:   09/09/22 1100  TempSrc:   PainSc: 0-No pain                 [name]

## 2022-09-09 NOTE — Discharge Instructions (Signed)
   [facility]  Discharge Instructions  AV Fistula or Graft Surgery for Dialysis Access  Please refer to the following instructions for your post-procedure care. Your surgeon or physician assistant will discuss any changes with you.  Activity  You may drive the day following your surgery, if you are comfortable and no longer taking prescription pain medication. Resume full activity as the soreness in your incision resolves.  Bathing/Showering  You may shower after you go home. Keep your incision dry for 48 hours. Do not soak in a bathtub, hot tub, or swim until the incision heals completely. You may not shower if you have a hemodialysis catheter.  Incision Care  Clean your incision with mild soap and water after 48 hours. Pat the area dry with a clean towel. You do not need a bandage unless otherwise instructed. Do not apply any ointments or creams to your incision. You may have skin glue on your incision. Do not peel it off. It will come off on its own in about one week. Your arm may swell a bit after surgery. To reduce swelling use pillows to elevate your arm so it is above your heart. Your doctor will tell you if you need to lightly wrap your arm with an ACE bandage.  Diet  Resume your normal diet. There are not special food restrictions following this procedure. In order to heal from your surgery, it is CRITICAL to get adequate nutrition. Your body requires vitamins, minerals, and protein. Vegetables are the best source of vitamins and minerals. Vegetables also provide the perfect balance of protein. Processed food has little nutritional value, so try to avoid this.  Medications  Resume taking all of your medications. If your incision is causing pain, you may take over-the counter pain relievers such as acetaminophen (Tylenol). If you were prescribed a stronger pain medication, please be aware these medications can cause nausea and constipation. Prevent  nausea by taking the medication with a snack or meal. Avoid constipation by drinking plenty of fluids and eating foods with high amount of fiber, such as fruits, vegetables, and grains.  Do not take Tylenol if you are taking prescription pain medications.  Follow up Your surgeon may want to see you in the office following your access surgery. If so, this will be arranged at the time of your surgery.  Please call us immediately for any of the following conditions:  Increased pain, redness, drainage (pus) from your incision site Fever of 101 degrees or higher Severe or worsening pain at your incision site Hand pain or numbness.  Reduce your risk of vascular disease:  Stop smoking. If you would like help, call [facility] at 1-800-QUIT-NOW 709-160-4259) or [place] at Rake your cholesterol Maintain a desired weight Control your diabetes Keep your blood pressure down  Dialysis  It will take several weeks to several months for your new dialysis access to be ready for use. Your surgeon will determine when it is okay to use it. Your nephrologist will continue to direct your dialysis. You can continue to use your Permcath until your new access is ready for use.   09/09/2022 Jane [name] [id number] [birth year]  Surgeon(s): Waynetta Sandy, MD  Procedure(s): Creation 1st stage basilic vein transposition  x Do not stick fistula for 12 weeks    If you have any questions, please call the office at [phone].

## 2022-09-09 NOTE — Anesthesia Procedure Notes (Signed)
Anesthesia Regional Block: Supraclavicular block   Pre-Anesthetic Checklist: , timeout performed,  Correct Patient, Correct Site, Correct Laterality,  Correct Procedure, Correct Position, site marked,  Risks and benefits discussed,  Surgical consent,  Pre-op evaluation,  At surgeon's request and post-op pain management  Laterality: Left  Prep: chloraprep       Needles:  Injection technique: Single-shot  Needle Type: Echogenic Needle     Needle Length: 9cm      Additional Needles:   Procedures:,,,, ultrasound used (permanent image in chart),,    Narrative:  Start time: 09/09/2022 8:55 AM End time: 09/09/2022 9:05 AM Injection made incrementally with aspirations every 5 mL.  Performed by: Personally  Anesthesiologist: Myrtie Soman, MD  Additional Notes: Patient tolerated the procedure well without complications

## 2022-09-09 NOTE — Anesthesia Procedure Notes (Signed)
Anesthesia Procedure Image    

## 2022-09-09 NOTE — Op Note (Signed)
    Patient name: Jane [name] MRN: [id number] DOB: 08/21/1945 Sex: female  09/09/2022 Pre-operative Diagnosis: esrd Post-operative diagnosis:  Same Surgeon:  Erlene Quan C. Donzetta Matters, MD Assistant: Leontine Locket, PA Procedure Performed: Left arm brachial artery to basilic vein AV fistula creation  Indications: 77 year old female with history of end-stage renal disease she has a thrombosed left upper arm graft and recently was on dialysis via right forearm access was ligated for bleeding.  She is now on dialysis via catheter and is indicated for permanent access creation in the left upper extremity.  Findings: Ultrasound of the basilic vein measured 5 mm above the antecubital vein and this was dilated to 4.5 mm.  The brachial artery was large approximately 5 mm as well with thrombosed AV graft within the incision.  After fistula creation there was brisk flow throughout the proximal fistula and strong radial artery signal at the wrist that did augment with compression of the fistula.   Procedure:  The patient was identified in the holding area and taken to the operating room where she was placed supine operative table and MAC anesthesia was induced.  She was sterilely prepped and draped in the left upper extremity in the usual fashion, antibiotics were ministered a timeout was called.  A preoperative block of been placed and this was checked and noted to be intact.  Ultrasound was used to identify what appeared to be a suitable basilic vein and brachial artery above the antecubitum.  A previous transverse incision above the antecubitum was reopened.  We dissected first down to the vein divided multiple branches between ties and marked this for orientation.  We then dissected through significant scar tissue identified a graft and proximal to this we isolated the brachial artery and placed a vessel loop around this.  The vein was then tied off and transected distally serially dilated to 4.5 mm flushed with  heparinized saline and clamped.  The artery was then clamped distally and proximally and opened longitudinally and flushed with heparinized saline distally only given its large size.  The vein was spatulated and sewn into side with 6-0 Prolene suture.  Prior to completion we allowed flushing in all directions.  There was a very strong thrill in the fistula confirmed with Doppler and a strong radial artery signal at the wrist confirmed with Doppler that augmented with compression of the fistula.  Satisfied with this we irrigated the wound and obtain hemostasis and closed in layers with Vicryl and Monocryl.  All counts were correct at completion.  Patient tolerated procedure well and was transferred to recovery in stable condition.  An experienced assistant was necessary to facilitate exposure of the vein and redo exposure of the artery and to facilitate anastomosis.  EBL: 20cc   Arick Mareno C. Donzetta Matters, MD [facility] [place] Office: [phone] Pager: [phone]

## 2022-09-09 NOTE — Transfer of Care (Signed)
Immediate Anesthesia Transfer of Care Note  Patient: Jane [name]  Procedure(s) Performed: BRACHIOBASILIC ARTERIOVENOUS (AV) FISTULA CREATION (Left: Arm Upper)  Patient Location: PACU  Anesthesia Type:MAC and Regional  Level of Consciousness: drowsy and patient cooperative  Airway & Oxygen Therapy: Patient Spontanous Breathing  Post-op Assessment: Report given to RN and Post -op Vital signs reviewed and stable  Post vital signs: Reviewed and stable  Last Vitals:  Vitals Value Taken Time  BP 96/38 09/09/22 1101  Temp    Pulse 76 09/09/22 1103  Resp 23 09/09/22 1103  SpO2 94 % 09/09/22 1103  Vitals shown include unvalidated device data.  Last Pain:  Vitals:   09/09/22 0707  TempSrc:   PainSc: 9          Complications: No notable events documented.

## 2022-09-10 ENCOUNTER — Encounter (HOSPITAL_COMMUNITY): Payer: Self-pay | Admitting: Vascular Surgery

## 2022-09-12 DIAGNOSIS — N186 End stage renal disease: Secondary | ICD-10-CM | POA: Diagnosis not present

## 2022-09-12 DIAGNOSIS — D631 Anemia in chronic kidney disease: Secondary | ICD-10-CM | POA: Diagnosis not present

## 2022-09-12 DIAGNOSIS — D509 Iron deficiency anemia, unspecified: Secondary | ICD-10-CM | POA: Diagnosis not present

## 2022-09-12 DIAGNOSIS — N2581 Secondary hyperparathyroidism of renal origin: Secondary | ICD-10-CM | POA: Diagnosis not present

## 2022-09-12 DIAGNOSIS — Z992 Dependence on renal dialysis: Secondary | ICD-10-CM | POA: Diagnosis not present

## 2022-09-15 ENCOUNTER — Telehealth: Payer: Self-pay | Admitting: Physical Medicine & Rehabilitation

## 2022-09-15 DIAGNOSIS — D509 Iron deficiency anemia, unspecified: Secondary | ICD-10-CM | POA: Diagnosis not present

## 2022-09-15 DIAGNOSIS — N2581 Secondary hyperparathyroidism of renal origin: Secondary | ICD-10-CM | POA: Diagnosis not present

## 2022-09-15 DIAGNOSIS — Z992 Dependence on renal dialysis: Secondary | ICD-10-CM | POA: Diagnosis not present

## 2022-09-15 DIAGNOSIS — D631 Anemia in chronic kidney disease: Secondary | ICD-10-CM | POA: Diagnosis not present

## 2022-09-15 DIAGNOSIS — N186 End stage renal disease: Secondary | ICD-10-CM | POA: Diagnosis not present

## 2022-09-15 MED ORDER — METHYLPREDNISOLONE 4 MG PO TBPK: ORAL_TABLET | ORAL | 0 refills | Status: DC

## 2022-09-15 NOTE — Addendum Note (Signed)
Addended by: Charlett Blake on: 09/15/2022 03:07 PM   Modules accepted: Orders

## 2022-09-15 NOTE — Telephone Encounter (Signed)
Patient called in states her sciatic nerve has started to hurt and she can barley walk . Pain started Thursday 09/11/22 , patient asked if she could possibly get an injection or what she should do

## 2022-09-16 ENCOUNTER — Ambulatory Visit: Payer: Medicare Other | Admitting: Family Medicine

## 2022-09-16 MED ORDER — METHYLPREDNISOLONE 4 MG PO TBPK: ORAL_TABLET | ORAL | 0 refills | Status: DC

## 2022-09-16 NOTE — Telephone Encounter (Signed)
Rx sent to [facility].

## 2022-09-16 NOTE — Telephone Encounter (Signed)
Patient called and said her pharmacy is telling her they didn't get the prescription for medrol dosepak. She wants it to go to [facility] their telephone number is [phone].

## 2022-09-16 NOTE — Addendum Note (Signed)
Addended by: Caro Hight on: 09/16/2022 10:20 AM   Modules accepted: Orders

## 2022-09-17 ENCOUNTER — Encounter: Payer: Self-pay | Admitting: Vascular Surgery

## 2022-09-17 ENCOUNTER — Ambulatory Visit (INDEPENDENT_AMBULATORY_CARE_PROVIDER_SITE_OTHER): Payer: Medicare Other | Admitting: Vascular Surgery

## 2022-09-17 VITALS — BP 161/60 | HR 65 | Temp 98.6°F | Resp 20 | Ht 65.0 in | Wt 192.6 lb

## 2022-09-17 DIAGNOSIS — D631 Anemia in chronic kidney disease: Secondary | ICD-10-CM | POA: Diagnosis not present

## 2022-09-17 DIAGNOSIS — N186 End stage renal disease: Secondary | ICD-10-CM

## 2022-09-17 DIAGNOSIS — D509 Iron deficiency anemia, unspecified: Secondary | ICD-10-CM | POA: Diagnosis not present

## 2022-09-17 DIAGNOSIS — N2581 Secondary hyperparathyroidism of renal origin: Secondary | ICD-10-CM | POA: Diagnosis not present

## 2022-09-17 DIAGNOSIS — Z992 Dependence on renal dialysis: Secondary | ICD-10-CM | POA: Diagnosis not present

## 2022-09-17 NOTE — Progress Notes (Unsigned)
     Subjective:     [name], female   DOB: 08-16-1945, 77 y.o.   MRN: [id number]  HPI 77 year old female with end-stage renal disease currently dialyzing via catheter.  She has a recently placed left basilic vein fistula and there is concern that this has thrombosed.  She is not having any hand pain.   Review of Systems No issues    Objective:   Physical Exam Vitals:   09/17/22 1541  BP: (!) 161/60  Pulse: 65  Resp: 20  Temp: 98.6 F (37 C)  SpO2: 96%   Awake alert oriented Nonlabored respirations I can feel a weak thrill proximally in the fistula and with Doppler there is an audible signal.      Assessment/plan     77 year old female status post left arm AV fistula creation which has very weak signal.  I discussed with her the options being attempted fistulogram versus upper extremity venogram to evaluate for further options.  At this time patient has elected to wait and I will have her follow-up in 4 weeks with dialysis duplex and we can consider conversion to graft at that time although she states that she is tired right now and does not want any further procedures which is certainly understandable.  Continue to use catheter for now.        Mazal Ebey C. Donzetta Matters, MD [facility] [place] Office: [phone] Pager: [phone]

## 2022-09-19 ENCOUNTER — Other Ambulatory Visit: Payer: Self-pay

## 2022-09-19 DIAGNOSIS — Z992 Dependence on renal dialysis: Secondary | ICD-10-CM

## 2022-09-19 DIAGNOSIS — N186 End stage renal disease: Secondary | ICD-10-CM

## 2022-09-19 DIAGNOSIS — I158 Other secondary hypertension: Secondary | ICD-10-CM | POA: Diagnosis not present

## 2022-09-19 DIAGNOSIS — D631 Anemia in chronic kidney disease: Secondary | ICD-10-CM | POA: Diagnosis not present

## 2022-09-19 DIAGNOSIS — N2581 Secondary hyperparathyroidism of renal origin: Secondary | ICD-10-CM | POA: Diagnosis not present

## 2022-09-22 DIAGNOSIS — Z992 Dependence on renal dialysis: Secondary | ICD-10-CM | POA: Diagnosis not present

## 2022-09-22 DIAGNOSIS — D631 Anemia in chronic kidney disease: Secondary | ICD-10-CM | POA: Diagnosis not present

## 2022-09-22 DIAGNOSIS — N186 End stage renal disease: Secondary | ICD-10-CM | POA: Diagnosis not present

## 2022-09-22 DIAGNOSIS — N2581 Secondary hyperparathyroidism of renal origin: Secondary | ICD-10-CM | POA: Diagnosis not present

## 2022-09-24 DIAGNOSIS — D631 Anemia in chronic kidney disease: Secondary | ICD-10-CM | POA: Diagnosis not present

## 2022-09-24 DIAGNOSIS — N2581 Secondary hyperparathyroidism of renal origin: Secondary | ICD-10-CM | POA: Diagnosis not present

## 2022-09-24 DIAGNOSIS — Z992 Dependence on renal dialysis: Secondary | ICD-10-CM | POA: Diagnosis not present

## 2022-09-24 DIAGNOSIS — N186 End stage renal disease: Secondary | ICD-10-CM | POA: Diagnosis not present

## 2022-09-25 DIAGNOSIS — I10 Essential (primary) hypertension: Secondary | ICD-10-CM | POA: Diagnosis not present

## 2022-09-26 ENCOUNTER — Telehealth: Payer: Self-pay

## 2022-09-26 DIAGNOSIS — N186 End stage renal disease: Secondary | ICD-10-CM | POA: Diagnosis not present

## 2022-09-26 DIAGNOSIS — Z992 Dependence on renal dialysis: Secondary | ICD-10-CM | POA: Diagnosis not present

## 2022-09-26 DIAGNOSIS — D631 Anemia in chronic kidney disease: Secondary | ICD-10-CM | POA: Diagnosis not present

## 2022-09-26 DIAGNOSIS — N2581 Secondary hyperparathyroidism of renal origin: Secondary | ICD-10-CM | POA: Diagnosis not present

## 2022-09-26 NOTE — Progress Notes (Unsigned)
Care Management & Coordination Services Pharmacy Team  Reason for Encounter: Medication coordination and delivery  Contacted patient to discuss medications and coordinate delivery from [facility]. {US HC Outreach:28874}  Cycle dispensing form sent to *** for review.   Last adherence delivery date:09/10/2022      Patient is due for next adherence delivery on: 10/09/2022  This delivery to include: Vials  90 Days  Atorvastatin 10 mg - 1 tablet once daily Linzess 72 mcg - 1 capsule before breakfast  Famotidine '40mg'$  - 1 tablet twice daily Dialyvite 800 with zinc - take 1 tablet every morning Carvedilol 12.5 mg - 1 tablet twice daily Lansoprazole 30 - take twice daily Hydralazine 25 mg - 1/2 tablet 3 times daily (med list) and fill hx is 50 mg 1 tab TID  Patient declined the following medications this month: Diltiazem 60 mg  - 1 tablet 3 times daily as needed for Afib   {Delivery date:25786}  Any concerns about your medications? {yes/no:20286}  How often do you forget or accidentally miss a dose? {Missed doses:25554}  Do you use a pillbox? No  Recent blood pressure readings are as follows:***  Recent blood glucose readings are as follows:***  Chart review: Recent office visits:  None  Recent consult visits:  09/17/2022 Servando Snare MD (vascular) - Patient was seen for ESRD. No medication changes.  09/04/2022 [name] MD (physical med & rehab) - Patient was seen for spondylosis without myelopathy or radiculopathy, lumbar region. No medication changes.   Hospital visits:  Admitted to [facility] on 09/09/2022 ( 6 hours) due to brachiobasilic arteriovenous fistula creation.    New?Medications Started at [facility] Discharge:?? traMADol (Ultram) Medication Changes at Hospital Discharge: None Medications Discontinued at Hospital Discharge: None Medications that remain the same after Hospital Discharge:??  -All other medications will remain  the same.    Medications: Outpatient Encounter Medications as of 09/26/2022  Medication Sig Note   atorvastatin (LIPITOR) 10 MG tablet Take 1 tablet (10 mg total) by mouth daily.    carvedilol (COREG) 12.5 MG tablet Take 1 tablet (12.5 mg total) by mouth 2 (two) times daily with a meal.    cinacalcet (SENSIPAR) 30 MG tablet Take 30 mg by mouth every Monday, Wednesday, and Friday with hemodialysis. Post dialysis 09/05/2022: Given at dialysis   diltiazem (CARDIZEM) 60 MG tablet Take 1 tablet (60 mg total) by mouth 3 (three) times daily as needed (Atrial fibrillation).    famotidine (PEPCID) 40 MG tablet Take 1 tablet (40 mg total) by mouth 2 (two) times daily.    folic acid-vitamin b complex-vitamin c-selenium-zinc (DIALYVITE) 3 MG TABS tablet Take 1 tablet by mouth at bedtime.    hydrALAZINE (APRESOLINE) 25 MG tablet Take 12.5 mg by mouth 3 (three) times daily.    lansoprazole (PREVACID) 30 MG capsule TAKE ONE CAPSULE BY MOUTH TWICE DAILY    LINZESS 72 MCG capsule TAKE ONE CAPSULE BY MOUTH BEFORE BREAKFAST (Patient taking differently: Take 72 mcg by mouth every other day.)    Methoxy PEG-Epoetin Beta (MIRCERA IJ) Inject as directed. 09/05/2022: Given at dialysis   methylPREDNISolone (MEDROL DOSEPAK) 4 MG TBPK tablet 2 tab TID for 1 d, 2 tab BID and 1 tab at hs x 1 d, 2 tab BID x 1 d, 1 tab TID x 1 d. 1 tab BID x 1 d, 1 tab at noon x 1 d    sevelamer carbonate (RENVELA) 800 MG tablet Take 2,100 mg by mouth in the morning  and at bedtime. With meals take 800 mg with snack    traMADol (ULTRAM) 50 MG tablet Take 1 tablet (50 mg total) by mouth every 6 (six) hours as needed.    Facility-Administered Encounter Medications as of 09/26/2022  Medication   betamethasone acetate-betamethasone sodium phosphate (CELESTONE) injection 12 mg   lidocaine (XYLOCAINE) 1 % (with pres) injection 4 mL   BP Readings from Last 3 Encounters:  09/17/22 (!) 161/60  09/09/22 (!) 116/53  09/04/22 (!) 147/74    Pulse  Readings from Last 3 Encounters:  09/17/22 65  09/09/22 70  09/04/22 68    No results found for: "HGBA1C" Lab Results  Component Value Date   CREATININE 8.90 (H) 09/09/2022   BUN 30 (H) 09/09/2022   GFR 2.96 (LL) 09/19/2021   GFRNONAA 7 (L) 07/04/2022   GFRAA 4 (L) 01/18/2020   NA 141 09/09/2022   K 4.1 09/09/2022   CALCIUM 8.7 (L) 07/04/2022   CO2 27 07/04/2022   [place] Pharmacist Assistant [phone]

## 2022-09-29 ENCOUNTER — Telehealth: Payer: Self-pay

## 2022-09-29 DIAGNOSIS — Z992 Dependence on renal dialysis: Secondary | ICD-10-CM | POA: Diagnosis not present

## 2022-09-29 DIAGNOSIS — N186 End stage renal disease: Secondary | ICD-10-CM | POA: Diagnosis not present

## 2022-09-29 DIAGNOSIS — D631 Anemia in chronic kidney disease: Secondary | ICD-10-CM | POA: Diagnosis not present

## 2022-09-29 DIAGNOSIS — N2581 Secondary hyperparathyroidism of renal origin: Secondary | ICD-10-CM | POA: Diagnosis not present

## 2022-09-29 NOTE — Telephone Encounter (Signed)
Pt called stating that her arm is swollen and hanging down at her fistula.  Reviewed pt's chart, returned call for clarification, two identifiers used. Pt noticed her L arm was swollen and hard this past Thursday. She has been using ice on the swelling, with no effect. She noted some warmth and redness to the site, but it is now gone. Informed her of concern for DVT and told her to seek urgent or emergent care if she had worsening symptoms. Moved appts earlier to first available. Instructed her to elevate her arm and use a light ACE wrap. Confirmed understanding.

## 2022-09-29 NOTE — Telephone Encounter (Signed)
Eudelia Bunch @ [facility] called stating that she had 2 pts that needed to be seen for issues asap.  Reviewed pt's chart, returned call for clarification, two identifiers used. She stated that the pt had an orange bubble or lump on her access. It was not fluid filled. She wanted to get her an appt. Informed her that I had just spoken to the pt and moved her appts earlier, but was unable to make them any earlier than 3/20. She stated she would work with the pt to get her HD treatment moved. Confirmed understanding.

## 2022-10-01 DIAGNOSIS — N2581 Secondary hyperparathyroidism of renal origin: Secondary | ICD-10-CM | POA: Diagnosis not present

## 2022-10-01 DIAGNOSIS — Z992 Dependence on renal dialysis: Secondary | ICD-10-CM | POA: Diagnosis not present

## 2022-10-01 DIAGNOSIS — N186 End stage renal disease: Secondary | ICD-10-CM | POA: Diagnosis not present

## 2022-10-01 DIAGNOSIS — D631 Anemia in chronic kidney disease: Secondary | ICD-10-CM | POA: Diagnosis not present

## 2022-10-03 DIAGNOSIS — Z992 Dependence on renal dialysis: Secondary | ICD-10-CM | POA: Diagnosis not present

## 2022-10-03 DIAGNOSIS — N2581 Secondary hyperparathyroidism of renal origin: Secondary | ICD-10-CM | POA: Diagnosis not present

## 2022-10-03 DIAGNOSIS — N186 End stage renal disease: Secondary | ICD-10-CM | POA: Diagnosis not present

## 2022-10-03 DIAGNOSIS — D631 Anemia in chronic kidney disease: Secondary | ICD-10-CM | POA: Diagnosis not present

## 2022-10-06 DIAGNOSIS — N186 End stage renal disease: Secondary | ICD-10-CM | POA: Diagnosis not present

## 2022-10-06 DIAGNOSIS — Z992 Dependence on renal dialysis: Secondary | ICD-10-CM | POA: Diagnosis not present

## 2022-10-06 DIAGNOSIS — N2581 Secondary hyperparathyroidism of renal origin: Secondary | ICD-10-CM | POA: Diagnosis not present

## 2022-10-06 DIAGNOSIS — D631 Anemia in chronic kidney disease: Secondary | ICD-10-CM | POA: Diagnosis not present

## 2022-10-08 ENCOUNTER — Ambulatory Visit (INDEPENDENT_AMBULATORY_CARE_PROVIDER_SITE_OTHER): Payer: Medicare Other | Admitting: Physician Assistant

## 2022-10-08 ENCOUNTER — Ambulatory Visit (HOSPITAL_COMMUNITY)
Admission: RE | Admit: 2022-10-08 | Discharge: 2022-10-08 | Disposition: A | Discharge status: Home / Self Care | Payer: Medicare Other | Source: Ambulatory Visit | Attending: Vascular Surgery | Admitting: Vascular Surgery

## 2022-10-08 VITALS — BP 190/81 | HR 73 | Temp 97.6°F | Wt 197.0 lb

## 2022-10-08 DIAGNOSIS — N186 End stage renal disease: Secondary | ICD-10-CM | POA: Insufficient documentation

## 2022-10-08 DIAGNOSIS — Z992 Dependence on renal dialysis: Secondary | ICD-10-CM | POA: Insufficient documentation

## 2022-10-08 NOTE — Progress Notes (Signed)
    Postoperative Access Visit   History of Present Illness   Jane [name] is a 77 y.o. year old female who presents today with her daughter for follow up s/p left Brachiobasilic AV fistula on [id number] by Dr. Donzetta Matters. Her incision has healed nicely however she did notice about 2 weeks ago large swollen area around incision. She notes no pain but area is tender if compressed. She was initially seen right after her surgery and noted to have minimal flow through her fistula but did not want to proceed with any interventions at that time. She is here today for follow up with duplex. She is currently without any signs or symptoms of steal.   She is currently dialyzing via left IJ TDC on TTS  Physical Examination   Vitals:   10/08/22 0901  BP: (!) 190/81  Pulse: 73  Temp: 97.6 F (36.4 C)  TempSrc: Temporal  SpO2: 96%  Weight: 197 lb (89.4 kg)   Body mass index is 32.78 kg/m.  left arm Incision is well healed, 2+ radial pulse, hand grip is 5/5, sensation in digits is intact, no palpable thrill, bruit can not be auscultated. Fistula is clotted. She has large, soft, hematoma at left [facility]. No signs of infection   Non invasive vascular lab: Findings:  +--------------------+----------+-----------------+------------------+  AVF                PSV (cm/s)Flow Vol (mL/min)     Comments       +--------------------+----------+-----------------+------------------+  Native artery inflow    78           36        Triphasic waveform  +--------------------+----------+-----------------+------------------+  AVF Anastomosis         31                                         +--------------------+----------+-----------------+------------------+   Summary:  Occluded left AV fistula at the anastomosis. Large avascular heterogenous area of the medial upper arm arising near the anastomosis measuring 5.2 x 6.3 x 5.5cm, suggestive of possible hematoma. Unable to adequately evaluate area of concern to  visualize outflow vein secondary to obscured anatomy  Medical Decision Making   Jane [name] is a 77 y.o. year old female who presents s/p left Brachiobasilic AV fistula on [id number] by Dr. Donzetta Matters. Her incision has healed nicely however she did notice about 2 weeks ago large swollen area around incision. She has hematoma on exam and also noted on her duplex today. Her duplex today shows clotted AV fistula. She is currently without any signs or symptoms of steal. Discussed with patient her access options going forward likely being AVG vs catheter dependency. She requested that I discuss this with Dr. Donzetta Matters.  - Patient seen with Dr. Donzetta Matters and she is agreeable to proceed with Venogram to evaluate for further access options. She understands that likely next she will need an AVG. She has exhausted her fistula options and will need AVG vs being catheter dependent - Will arrange Venogram BUE in the near future with Dr. Jeri Lager, PA-C [facility] Office: [phone]  Clinic MD: Cain/ Scot Dock

## 2022-10-08 NOTE — H&P (View-Only) (Signed)
    Postoperative Access Visit   History of Present Illness   Jane [name] is a 76 y.o. year old female who presents today with her daughter for follow up s/p left Brachiobasilic AV fistula on 09/09/22 by Dr. Cain. Her incision has healed nicely however she did notice about 2 weeks ago large swollen area around incision. She notes no pain but area is tender if compressed. She was initially seen right after her surgery and noted to have minimal flow through her fistula but did not want to proceed with any interventions at that time. She is here today for follow up with duplex. She is currently without any signs or symptoms of steal.   She is currently dialyzing via left IJ TDC on TTS  Physical Examination   Vitals:   10/08/22 0901  BP: (!) 190/81  Pulse: 73  Temp: 97.6 F (36.4 C)  TempSrc: Temporal  SpO2: 96%  Weight: 197 lb (89.4 kg)   Body mass index is 32.78 kg/m.  left arm Incision is well healed, 2+ radial pulse, hand grip is 5/5, sensation in digits is intact, no palpable thrill, bruit can not be auscultated. Fistula is clotted. She has large, soft, hematoma at left AC. No signs of infection   Non invasive vascular lab: Findings:  +--------------------+----------+-----------------+------------------+  AVF                PSV (cm/s)Flow Vol (mL/min)     Comments       +--------------------+----------+-----------------+------------------+  Native artery inflow    78           36        Triphasic waveform  +--------------------+----------+-----------------+------------------+  AVF Anastomosis         31                                         +--------------------+----------+-----------------+------------------+   Summary:  Occluded left AV fistula at the anastomosis. Large avascular heterogenous area of the medial upper arm arising near the anastomosis measuring 5.2 x 6.3 x 5.5cm, suggestive of possible hematoma. Unable to adequately evaluate area of concern to  visualize outflow vein secondary to obscured anatomy  Medical Decision Making   Jane [name] is a 76 y.o. year old female who presents s/p left Brachiobasilic AV fistula on 09/09/22 by Dr. Cain. Her incision has healed nicely however she did notice about 2 weeks ago large swollen area around incision. She has hematoma on exam and also noted on her duplex today. Her duplex today shows clotted AV fistula. She is currently without any signs or symptoms of steal. Discussed with patient her access options going forward likely being AVG vs catheter dependency. She requested that I discuss this with Dr. Cain.  - Patient seen with Dr. Cain and she is agreeable to proceed with Venogram to evaluate for further access options. She understands that likely next she will need an AVG. She has exhausted her fistula options and will need AVG vs being catheter dependent - Will arrange Venogram BUE in the near future with Dr. Cain   Izabelle Daus, PA-C [facility] [place] [facility] Office: [phone]  Clinic MD: Cain/ Dickson 

## 2022-10-09 ENCOUNTER — Other Ambulatory Visit: Payer: Self-pay

## 2022-10-09 DIAGNOSIS — N186 End stage renal disease: Secondary | ICD-10-CM

## 2022-10-09 MED ORDER — SODIUM CHLORIDE 0.9% FLUSH: 3.0000 mL | Freq: Two times a day (BID) | INTRAVENOUS | Status: DC

## 2022-10-09 MED ORDER — SODIUM CHLORIDE 0.9 % IV SOLN: 250.0000 mL | INTRAVENOUS | Status: DC | PRN

## 2022-10-10 DIAGNOSIS — D631 Anemia in chronic kidney disease: Secondary | ICD-10-CM | POA: Diagnosis not present

## 2022-10-10 DIAGNOSIS — Z992 Dependence on renal dialysis: Secondary | ICD-10-CM | POA: Diagnosis not present

## 2022-10-10 DIAGNOSIS — N186 End stage renal disease: Secondary | ICD-10-CM | POA: Diagnosis not present

## 2022-10-10 DIAGNOSIS — N2581 Secondary hyperparathyroidism of renal origin: Secondary | ICD-10-CM | POA: Diagnosis not present

## 2022-10-13 DIAGNOSIS — N2581 Secondary hyperparathyroidism of renal origin: Secondary | ICD-10-CM | POA: Diagnosis not present

## 2022-10-13 DIAGNOSIS — Z992 Dependence on renal dialysis: Secondary | ICD-10-CM | POA: Diagnosis not present

## 2022-10-13 DIAGNOSIS — N186 End stage renal disease: Secondary | ICD-10-CM | POA: Diagnosis not present

## 2022-10-13 DIAGNOSIS — D631 Anemia in chronic kidney disease: Secondary | ICD-10-CM | POA: Diagnosis not present

## 2022-10-15 DIAGNOSIS — Z992 Dependence on renal dialysis: Secondary | ICD-10-CM | POA: Diagnosis not present

## 2022-10-15 DIAGNOSIS — D631 Anemia in chronic kidney disease: Secondary | ICD-10-CM | POA: Diagnosis not present

## 2022-10-15 DIAGNOSIS — N2581 Secondary hyperparathyroidism of renal origin: Secondary | ICD-10-CM | POA: Diagnosis not present

## 2022-10-15 DIAGNOSIS — N186 End stage renal disease: Secondary | ICD-10-CM | POA: Diagnosis not present

## 2022-10-16 ENCOUNTER — Encounter: Payer: Self-pay | Admitting: Physical Medicine & Rehabilitation

## 2022-10-16 ENCOUNTER — Encounter: Payer: Medicare Other | Attending: Physical Medicine & Rehabilitation | Admitting: Physical Medicine & Rehabilitation

## 2022-10-16 VITALS — BP 224/83 | HR 66 | Ht 65.0 in | Wt 196.2 lb

## 2022-10-16 DIAGNOSIS — M25561 Pain in right knee: Secondary | ICD-10-CM | POA: Insufficient documentation

## 2022-10-16 DIAGNOSIS — G8929 Other chronic pain: Secondary | ICD-10-CM | POA: Insufficient documentation

## 2022-10-16 DIAGNOSIS — M47816 Spondylosis without myelopathy or radiculopathy, lumbar region: Secondary | ICD-10-CM | POA: Insufficient documentation

## 2022-10-16 MED ORDER — TRAMADOL HCL 50 MG PO TABS: 50.0000 mg | ORAL_TABLET | Freq: Two times a day (BID) | ORAL | 0 refills | Status: DC

## 2022-10-16 NOTE — Progress Notes (Signed)
Subjective:    [name], female    DOB: 1946-06-01, 77 y.o.   MRN: [id number]  HPI CC:  Low back pain and RIght knee pain 77 yo female with history of end-stage renal disease as well as lumbar L4-5 fusion with chronic low back pain.   Sep 04, 2022.  Left L5 -S1 facet  intra articular injection pain in back down to 5/10  THe pt feels like limping related to severe OA right knee is also contributing to her LBP.  Has seen ortho and offered surgery vs knee injection.  Last knee injection was Oct 2023.  Pt states it only helped for a couple weeks.  We discussed longer duration options including Zilretta injections Voltaren gel to RIght knee X-rays of the right knee dated 06/26/2022 reviewed showing medial joint space narrowingdue to OA. Pain Inventory Average Pain 9 Pain Right Now 10 My pain is dull and aching  In the last 24 hours, has pain interfered with the following? General activity 10 Relation with others 0 Enjoyment of life 0 What TIME of day is your pain at its worst? morning  and daytime Sleep (in general) Poor  Pain is worse with: walking, sitting, standing, and some activites Pain improves with: rest Relief from Meds: 0  Family History  Problem Relation Age of Onset   Heart failure Mother    Stroke Mother    Other Father    Colon cancer Neg Hx    Liver disease Neg Hx    Esophageal cancer Neg Hx    Stomach cancer Neg Hx    Inflammatory bowel disease Neg Hx    Rectal cancer Neg Hx    Pancreatic cancer Neg Hx    Social History   Socioeconomic History   Marital status: Divorced    Spouse name: Not on file   Number of children: 4   Years of education: 14   Highest education level: Associate degree: occupational, Hotel manager, or vocational program  Occupational History   Occupation: Retired  Tobacco Use   Smoking status: Never    Passive exposure: Never   Smokeless tobacco: Never  Vaping Use   Vaping Use: Never used  Substance and Sexual  Activity   Alcohol use: No   Drug use: No   Sexual activity: Never    Birth control/protection: Post-menopausal  Other Topics Concern   Not on file  Social History Narrative   HH 1   Divorced   Outpatient dialysis Mon, Wed, Fri   4 children: 1 daughter locally is an Designer, multimedia and 3 sons in [place]   Social Determinants of Health   Financial Resource Strain: High Risk (07/09/2022)   Overall Financial Resource Strain (CARDIA)    Difficulty of Paying Living Expenses: Hard  Food Insecurity: Food Insecurity Present (08/21/2021)   Hunger Vital Sign    Worried About Running Out of Food in the Last Year: Often true    Ran Out of Food in the Last Year: Often true  Transportation Needs: Unmet Transportation Needs (08/21/2021)   PRAPARE - Hydrologist (Medical): Yes    Lack of Transportation (Non-Medical): No  Physical Activity: Inactive (08/06/2021)   Exercise Vital Sign    Days of Exercise per Week: 0 days    Minutes of Exercise per Session: 0 min  Stress: Stress Concern Present (08/21/2021)   [place]    Feeling of Stress :  Very much  Social Connections: Moderately Isolated (07/31/2020)   Social Connection and Isolation Panel [NHANES]    Frequency of Communication with Friends and Family: More than three times a week    Frequency of Social Gatherings with Friends and Family: Never    Attends Religious Services: More than 4 times per year    Active Member of Clubs or Organizations: No    Attends Archivist Meetings: Never    Marital Status: Divorced   Past Surgical History:  Procedure Laterality Date   A/V FISTULAGRAM Right 10/18/2020   Procedure: A/V FISTULAGRAM;  Surgeon: Marty Heck, MD;  Location: [place] [facility];  Service: Cardiovascular;  Laterality: Right;   A/V FISTULAGRAM Right 09/05/2021   Procedure: A/V Fistulagram;  Surgeon: Marty Heck, MD;   Location: [place] [facility];  Service: Cardiovascular;  Laterality: Right;   AV FISTULA PLACEMENT     AV FISTULA PLACEMENT Left 09/09/2022   Procedure: BRACHIOBASILIC ARTERIOVENOUS (AV) FISTULA CREATION;  Surgeon: Waynetta Sandy, MD;  Location: [place];  Service: Vascular;  Laterality: Left;   BACK SURGERY     Lumbar fusion L 4 and L 5   BIOPSY  01/09/2020   Procedure: BIOPSY;  Surgeon: Irving Copas., MD;  Location: [place];  Service: Gastroenterology;;   BIOPSY  01/31/2021   Procedure: BIOPSY;  Surgeon: Irving Copas., MD;  Location: [place];  Service: Gastroenterology;;   BIOPSY  09/26/2021   Procedure: BIOPSY;  Surgeon: Thornton Park, MD;  Location: [place];  Service: Gastroenterology;;   COLONOSCOPY     COLONOSCOPY N/A 02/01/2021   Procedure: COLONOSCOPY;  Surgeon: Yetta Flock, MD;  Location: [place];  Service: Gastroenterology;  Laterality: N/A;   COLONOSCOPY WITH PROPOFOL N/A 09/26/2021   Procedure: COLONOSCOPY WITH PROPOFOL;  Surgeon: Thornton Park, MD;  Location: [place];  Service: Gastroenterology;  Laterality: N/A;   COLONOSCOPY WITH PROPOFOL N/A 09/29/2021   Procedure: COLONOSCOPY WITH PROPOFOL;  Surgeon: Thornton Park, MD;  Location: [place];  Service: Gastroenterology;  Laterality: N/A;   COLONOSCOPY WITH PROPOFOL N/A 10/01/2021   Procedure: COLONOSCOPY WITH PROPOFOL;  Surgeon: Mauri Pole, MD;  Location: [place];  Service: Gastroenterology;  Laterality: N/A;   ENDOSCOPIC MUCOSAL RESECTION N/A 01/09/2020   Procedure: ENDOSCOPIC MUCOSAL RESECTION;  Surgeon: Rush Landmark Telford Nab., MD;  Location: [place];  Service: Gastroenterology;  Laterality: N/A;   ENDOSCOPIC MUCOSAL RESECTION N/A 01/31/2021   Procedure: ENDOSCOPIC MUCOSAL RESECTION;  Surgeon: Rush Landmark Telford Nab., MD;  Location: [place];  Service: Gastroenterology;  Laterality: N/A;   ENTEROSCOPY N/A 09/26/2021   Procedure:  ENTEROSCOPY;  Surgeon: Thornton Park, MD;  Location: [place];  Service: Gastroenterology;  Laterality: N/A;   ESOPHAGOGASTRODUODENOSCOPY     ESOPHAGOGASTRODUODENOSCOPY (EGD) WITH PROPOFOL N/A 01/09/2020   Procedure: ESOPHAGOGASTRODUODENOSCOPY (EGD) WITH PROPOFOL;  Surgeon: Rush Landmark Telford Nab., MD;  Location: [place];  Service: Gastroenterology;  Laterality: N/A;   ESOPHAGOGASTRODUODENOSCOPY (EGD) WITH PROPOFOL N/A 01/13/2020   Procedure: ESOPHAGOGASTRODUODENOSCOPY (EGD) WITH PROPOFOL;  Surgeon: Irene Shipper, MD;  Location: [place];  Service: Gastroenterology;  Laterality: N/A;   ESOPHAGOGASTRODUODENOSCOPY (EGD) WITH PROPOFOL N/A 01/31/2021   Procedure: ESOPHAGOGASTRODUODENOSCOPY (EGD) WITH PROPOFOL;  Surgeon: Rush Landmark Telford Nab., MD;  Location: [place];  Service: Gastroenterology;  Laterality: N/A;   EUS N/A 01/09/2020   Procedure: UPPER ENDOSCOPIC ULTRASOUND (EUS) RADIAL;  Surgeon: Irving Copas., MD;  Location: [place];  Service: Gastroenterology;  Laterality: N/A;   FALSE ANEURYSM REPAIR Right 07/26/2022  Procedure: REPAIR RIGHT RADIO-CEPHALIC FISTULA ANEURYSM;  Surgeon: Broadus John, MD;  Location: [place];  Service: Vascular;  Laterality: Right;   GIVENS CAPSULE STUDY  09/29/2021   Procedure: GIVENS CAPSULE STUDY;  Surgeon: Thornton Park, MD;  Location: [place];  Service: Gastroenterology;;   HEMOSTASIS CLIP PLACEMENT  01/09/2020   Procedure: HEMOSTASIS CLIP PLACEMENT;  Surgeon: Irving Copas., MD;  Location: [place];  Service: Gastroenterology;;   HEMOSTASIS CLIP PLACEMENT  01/31/2021   Procedure: HEMOSTASIS CLIP PLACEMENT;  Surgeon: Irving Copas., MD;  Location: [place];  Service: Gastroenterology;;   HEMOSTASIS CONTROL  01/13/2020   Procedure: HEMOSTASIS CONTROL;  Surgeon: Irene Shipper, MD;  Location: [facility] ENDOSCOPY;  Service: Gastroenterology;;  hemaspray   HOT HEMOSTASIS N/A 01/13/2020   Procedure: HOT  HEMOSTASIS (ARGON PLASMA COAGULATION/BICAP);  Surgeon: Irene Shipper, MD;  Location: [place];  Service: Gastroenterology;  Laterality: N/A;   HOT HEMOSTASIS N/A 02/01/2021   Procedure: HOT HEMOSTASIS (ARGON PLASMA COAGULATION/BICAP);  Surgeon: Yetta Flock, MD;  Location: [facility] ENDOSCOPY;  Service: Gastroenterology;  Laterality: N/A;   HOT HEMOSTASIS N/A 10/01/2021   Procedure: HOT HEMOSTASIS (ARGON PLASMA COAGULATION/BICAP);  Surgeon: Mauri Pole, MD;  Location: [place];  Service: Gastroenterology;  Laterality: N/A;   INSERTION OF DIALYSIS CATHETER Left 07/26/2022   Procedure: INSERTION OF TUNNELED DIALYSIS CATHETER;  Surgeon: Broadus John, MD;  Location: [facility];  Service: Vascular;  Laterality: Left;   IR GENERIC HISTORICAL  07/09/2016   IR US GUIDE VASC ACCESS RIGHT 07/09/2016 Arne Cleveland, MD [facility]   IR GENERIC HISTORICAL  07/09/2016   IR FLUORO GUIDE CV LINE RIGHT 07/09/2016 Arne Cleveland, MD [facility]   KNEE ARTHROPLASTY Left    LAPAROSCOPIC SIGMOID COLECTOMY N/A 07/11/2016   Procedure: LAPAROSCOPIC SIGMOID COLECTOMY;  Surgeon: Clovis Riley, MD;  Location: [place];  Service: General;  Laterality: N/A;   LEFT ATRIAL APPENDAGE OCCLUSION N/A 02/06/2022   Procedure: LEFT ATRIAL APPENDAGE OCCLUSION;  Surgeon: Sherren Mocha, MD;  Location: [place] [facility];  Service: Cardiovascular;  Laterality: N/A;   LIGATION OF ARTERIOVENOUS  FISTULA Right 07/26/2022   Procedure: LIGATION OF RIGHT RADIO-CEPHALIC ARTERIOVENOUS  FISTULA;  Surgeon: Broadus John, MD;  Location: [place];  Service: Vascular;  Laterality: Right;   MASS EXCISION Right 04/10/2020   Procedure: EXCISION SKIN NODULE RIGHT FOREARM;  Surgeon: Waynetta Sandy, MD;  Location: [place];  Service: Vascular;  Laterality: Right;   POLYPECTOMY  09/26/2021   Procedure: POLYPECTOMY;  Surgeon: Thornton Park, MD;  Location: [place];  Service: Gastroenterology;;   POLYPECTOMY  10/01/2021    Procedure: POLYPECTOMY;  Surgeon: Mauri Pole, MD;  Location: [place];  Service: Gastroenterology;;   REVISON OF ARTERIOVENOUS FISTULA Right 07/04/2022   Procedure: REVISON OF RIGHT FOREARM ARTERIOVENOUS FISTULA;  Surgeon: Cherre Robins, MD;  Location: [place];  Service: Vascular;  Laterality: Right;   SCLEROTHERAPY  01/09/2020   Procedure: Clide Deutscher;  Surgeon: Mansouraty, Telford Nab., MD;  Location: [place];  Service: Gastroenterology;;   Clide Deutscher  01/13/2020   Procedure: Clide Deutscher;  Surgeon: Irene Shipper, MD;  Location: [place];  Service: Gastroenterology;;   SUBMUCOSAL LIFTING INJECTION  01/09/2020   Procedure: SUBMUCOSAL LIFTING INJECTION;  Surgeon: Irving Copas., MD;  Location: [place];  Service: Gastroenterology;;   TEE WITHOUT CARDIOVERSION N/A 02/06/2022   Procedure: TRANSESOPHAGEAL ECHOCARDIOGRAM (TEE);  Surgeon: Sherren Mocha, MD;  Location: [place] [facility];  Service: Cardiovascular;  Laterality: N/A;  TUBAL LIGATION     Past Surgical History:  Procedure Laterality Date   A/V FISTULAGRAM Right 10/18/2020   Procedure: A/V FISTULAGRAM;  Surgeon: Marty Heck, MD;  Location: [place] [facility];  Service: Cardiovascular;  Laterality: Right;   A/V FISTULAGRAM Right 09/05/2021   Procedure: A/V Fistulagram;  Surgeon: Marty Heck, MD;  Location: [place] [facility];  Service: Cardiovascular;  Laterality: Right;   AV FISTULA PLACEMENT     AV FISTULA PLACEMENT Left 09/09/2022   Procedure: BRACHIOBASILIC ARTERIOVENOUS (AV) FISTULA CREATION;  Surgeon: Waynetta Sandy, MD;  Location: [place];  Service: Vascular;  Laterality: Left;   BACK SURGERY     Lumbar fusion L 4 and L 5   BIOPSY  01/09/2020   Procedure: BIOPSY;  Surgeon: Irving Copas., MD;  Location: [place];  Service: Gastroenterology;;   BIOPSY  01/31/2021   Procedure: BIOPSY;  Surgeon: Irving Copas., MD;  Location: [place];   Service: Gastroenterology;;   BIOPSY  09/26/2021   Procedure: BIOPSY;  Surgeon: Thornton Park, MD;  Location: [place];  Service: Gastroenterology;;   COLONOSCOPY     COLONOSCOPY N/A 02/01/2021   Procedure: COLONOSCOPY;  Surgeon: Yetta Flock, MD;  Location: [place];  Service: Gastroenterology;  Laterality: N/A;   COLONOSCOPY WITH PROPOFOL N/A 09/26/2021   Procedure: COLONOSCOPY WITH PROPOFOL;  Surgeon: Thornton Park, MD;  Location: [place];  Service: Gastroenterology;  Laterality: N/A;   COLONOSCOPY WITH PROPOFOL N/A 09/29/2021   Procedure: COLONOSCOPY WITH PROPOFOL;  Surgeon: Thornton Park, MD;  Location: [place];  Service: Gastroenterology;  Laterality: N/A;   COLONOSCOPY WITH PROPOFOL N/A 10/01/2021   Procedure: COLONOSCOPY WITH PROPOFOL;  Surgeon: Mauri Pole, MD;  Location: [place];  Service: Gastroenterology;  Laterality: N/A;   ENDOSCOPIC MUCOSAL RESECTION N/A 01/09/2020   Procedure: ENDOSCOPIC MUCOSAL RESECTION;  Surgeon: Rush Landmark Telford Nab., MD;  Location: [place];  Service: Gastroenterology;  Laterality: N/A;   ENDOSCOPIC MUCOSAL RESECTION N/A 01/31/2021   Procedure: ENDOSCOPIC MUCOSAL RESECTION;  Surgeon: Rush Landmark Telford Nab., MD;  Location: [place];  Service: Gastroenterology;  Laterality: N/A;   ENTEROSCOPY N/A 09/26/2021   Procedure: ENTEROSCOPY;  Surgeon: Thornton Park, MD;  Location: [place];  Service: Gastroenterology;  Laterality: N/A;   ESOPHAGOGASTRODUODENOSCOPY     ESOPHAGOGASTRODUODENOSCOPY (EGD) WITH PROPOFOL N/A 01/09/2020   Procedure: ESOPHAGOGASTRODUODENOSCOPY (EGD) WITH PROPOFOL;  Surgeon: Rush Landmark Telford Nab., MD;  Location: [place];  Service: Gastroenterology;  Laterality: N/A;   ESOPHAGOGASTRODUODENOSCOPY (EGD) WITH PROPOFOL N/A 01/13/2020   Procedure: ESOPHAGOGASTRODUODENOSCOPY (EGD) WITH PROPOFOL;  Surgeon: Irene Shipper, MD;  Location: [place];  Service: Gastroenterology;  Laterality:  N/A;   ESOPHAGOGASTRODUODENOSCOPY (EGD) WITH PROPOFOL N/A 01/31/2021   Procedure: ESOPHAGOGASTRODUODENOSCOPY (EGD) WITH PROPOFOL;  Surgeon: Rush Landmark Telford Nab., MD;  Location: [place];  Service: Gastroenterology;  Laterality: N/A;   EUS N/A 01/09/2020   Procedure: UPPER ENDOSCOPIC ULTRASOUND (EUS) RADIAL;  Surgeon: Irving Copas., MD;  Location: [place];  Service: Gastroenterology;  Laterality: N/A;   FALSE ANEURYSM REPAIR Right 07/26/2022   Procedure: REPAIR RIGHT RADIO-CEPHALIC FISTULA ANEURYSM;  Surgeon: Broadus John, MD;  Location: [place];  Service: Vascular;  Laterality: Right;   GIVENS CAPSULE STUDY  09/29/2021   Procedure: GIVENS CAPSULE STUDY;  Surgeon: Thornton Park, MD;  Location: [place];  Service: Gastroenterology;;   HEMOSTASIS CLIP PLACEMENT  01/09/2020   Procedure: HEMOSTASIS CLIP PLACEMENT;  Surgeon: Irving Copas., MD;  Location: [place];  Service: Gastroenterology;;   HEMOSTASIS  CLIP PLACEMENT  01/31/2021   Procedure: HEMOSTASIS CLIP PLACEMENT;  Surgeon: Irving Copas., MD;  Location: [place];  Service: Gastroenterology;;   HEMOSTASIS CONTROL  01/13/2020   Procedure: HEMOSTASIS CONTROL;  Surgeon: Irene Shipper, MD;  Location: [facility] ENDOSCOPY;  Service: Gastroenterology;;  hemaspray   HOT HEMOSTASIS N/A 01/13/2020   Procedure: HOT HEMOSTASIS (ARGON PLASMA COAGULATION/BICAP);  Surgeon: Irene Shipper, MD;  Location: [place];  Service: Gastroenterology;  Laterality: N/A;   HOT HEMOSTASIS N/A 02/01/2021   Procedure: HOT HEMOSTASIS (ARGON PLASMA COAGULATION/BICAP);  Surgeon: Yetta Flock, MD;  Location: [facility] ENDOSCOPY;  Service: Gastroenterology;  Laterality: N/A;   HOT HEMOSTASIS N/A 10/01/2021   Procedure: HOT HEMOSTASIS (ARGON PLASMA COAGULATION/BICAP);  Surgeon: Mauri Pole, MD;  Location: [place];  Service: Gastroenterology;  Laterality: N/A;   INSERTION OF DIALYSIS CATHETER Left 07/26/2022   Procedure:  INSERTION OF TUNNELED DIALYSIS CATHETER;  Surgeon: Broadus John, MD;  Location: [facility] OR;  Service: Vascular;  Laterality: Left;   IR GENERIC HISTORICAL  07/09/2016   IR US GUIDE VASC ACCESS RIGHT 07/09/2016 Arne Cleveland, MD [facility]   IR GENERIC HISTORICAL  07/09/2016   IR FLUORO GUIDE CV LINE RIGHT 07/09/2016 Arne Cleveland, MD [facility]   KNEE ARTHROPLASTY Left    LAPAROSCOPIC SIGMOID COLECTOMY N/A 07/11/2016   Procedure: LAPAROSCOPIC SIGMOID COLECTOMY;  Surgeon: Clovis Riley, MD;  Location: [place];  Service: General;  Laterality: N/A;   LEFT ATRIAL APPENDAGE OCCLUSION N/A 02/06/2022   Procedure: LEFT ATRIAL APPENDAGE OCCLUSION;  Surgeon: Sherren Mocha, MD;  Location: [place] [facility];  Service: Cardiovascular;  Laterality: N/A;   LIGATION OF ARTERIOVENOUS  FISTULA Right 07/26/2022   Procedure: LIGATION OF RIGHT RADIO-CEPHALIC ARTERIOVENOUS  FISTULA;  Surgeon: Broadus John, MD;  Location: [place];  Service: Vascular;  Laterality: Right;   MASS EXCISION Right 04/10/2020   Procedure: EXCISION SKIN NODULE RIGHT FOREARM;  Surgeon: Waynetta Sandy, MD;  Location: [place];  Service: Vascular;  Laterality: Right;   POLYPECTOMY  09/26/2021   Procedure: POLYPECTOMY;  Surgeon: Thornton Park, MD;  Location: [place];  Service: Gastroenterology;;   POLYPECTOMY  10/01/2021   Procedure: POLYPECTOMY;  Surgeon: Mauri Pole, MD;  Location: [place];  Service: Gastroenterology;;   REVISON OF ARTERIOVENOUS FISTULA Right 07/04/2022   Procedure: REVISON OF RIGHT FOREARM ARTERIOVENOUS FISTULA;  Surgeon: Cherre Robins, MD;  Location: [place];  Service: Vascular;  Laterality: Right;   SCLEROTHERAPY  01/09/2020   Procedure: Clide Deutscher;  Surgeon: Mansouraty, Telford Nab., MD;  Location: [place];  Service: Gastroenterology;;   Clide Deutscher  01/13/2020   Procedure: Clide Deutscher;  Surgeon: Irene Shipper, MD;  Location: [place];  Service: Gastroenterology;;    SUBMUCOSAL LIFTING INJECTION  01/09/2020   Procedure: SUBMUCOSAL LIFTING INJECTION;  Surgeon: Irving Copas., MD;  Location: [place];  Service: Gastroenterology;;   TEE WITHOUT CARDIOVERSION N/A 02/06/2022   Procedure: TRANSESOPHAGEAL ECHOCARDIOGRAM (TEE);  Surgeon: Sherren Mocha, MD;  Location: [place] [facility];  Service: Cardiovascular;  Laterality: N/A;   TUBAL LIGATION     Past Medical History:  Diagnosis Date   Acid reflux    Anemia of chronic disease    Arthritis    Asthma    AVM (arteriovenous malformation)    Bilateral carotid bruits    Diverticulitis    Duodenal ulcer    Dysrhythmia    a-fib   ESRD (end stage renal disease) ([place])    MWF [place]  Hemorrhoids    History of blood transfusion    Hypertension    Malaise and fatigue    Orthostatic hypotension    PAF (paroxysmal atrial fibrillation) (HCC)    Presence of Watchman left atrial appendage closure device 02/06/2022   Watchman FLX 76mm with Dr. Burt Knack   Sleep apnea    does not need cpap per patient   Syncope    Tubulovillous adenoma of colon    BP (!) 224/83   Pulse 66   Ht 5\' 5"  (1.651 m)   Wt 196 lb 3.2 oz (89 kg)   SpO2 98%   BMI 32.65 kg/m   Opioid Risk Score:   Fall Risk Score:  `1  Depression screen [facility] 2/9     10/16/2022   11:48 AM 09/04/2022    9:57 AM 08/21/2022   11:37 AM 08/07/2022    9:26 AM 07/23/2022    1:51 PM 04/22/2022    2:01 PM 04/01/2022    8:15 AM  Depression screen PHQ 2/9  Decreased Interest 0 0 1 2 0 0 3  Down, Depressed, Hopeless 0 0 3 0 0 0 0  PHQ - 2 Score 0 0 4 2 0 0 3  Altered sleeping   1 3 3  3   Tired, decreased energy   2 3 2  3   Change in appetite   3 3 0  0  Feeling bad or failure about yourself    0 0 0  0  Trouble concentrating   0 0 0  0  Moving slowly or fidgety/restless   0 0 0  0  Suicidal thoughts   0 0 0  0  PHQ-9 Score   10 11 5  9   Difficult doing work/chores   Somewhat difficult Not difficult at all Not difficult at all  Not  difficult at all      Review of Systems  Musculoskeletal:  Positive for back pain and gait problem.  All other systems reviewed and are negative.     Objective:   Physical Exam  No acute distress Mood and affect appropriate Lumbar spine has mild tenderness palpation in the lumbosacral junction left side greater than right side.  No pain increased with flexion or extension or lateral bending. Lower extremities with negative straight leg raise bilaterally There is tenderness to palpation of the right medial joint line.  No tenderness along the lateral joint line or along the patellar tendon or quadricep tendon.  No fullness in the popliteal fossa No evidence of effusion.  No numbness over the knee.      Assessment & Plan:  1.  Chronic low back pain status post L4-L5 fusion has had T12 L1-L2 RF in the past.  Her pain is lower down.  It was partially relieved by lumbar facet injections and left-sided L5-S1 intra-articular.  At this point this is doing relatively well although still with moderate pain during ambulation. 2.  Right knee pain severe due to osteoarthritis.  Requesting pain medication we will prescribe tramadol 50 mg twice daily until she can come in for Zilretta injection.  She did not have long-lasting results with standard corticosteroid right knee performed in October 2023.  Would benefit from longer acting sustained-release  formulation. The patient is not a good candidate for nonsteroidal anti-inflammatory secondary to end-stage renal disease

## 2022-10-17 DIAGNOSIS — N186 End stage renal disease: Secondary | ICD-10-CM | POA: Diagnosis not present

## 2022-10-17 DIAGNOSIS — D631 Anemia in chronic kidney disease: Secondary | ICD-10-CM | POA: Diagnosis not present

## 2022-10-17 DIAGNOSIS — Z992 Dependence on renal dialysis: Secondary | ICD-10-CM | POA: Diagnosis not present

## 2022-10-17 DIAGNOSIS — N2581 Secondary hyperparathyroidism of renal origin: Secondary | ICD-10-CM | POA: Diagnosis not present

## 2022-10-20 DIAGNOSIS — I158 Other secondary hypertension: Secondary | ICD-10-CM | POA: Diagnosis not present

## 2022-10-20 DIAGNOSIS — D631 Anemia in chronic kidney disease: Secondary | ICD-10-CM | POA: Diagnosis not present

## 2022-10-20 DIAGNOSIS — Z992 Dependence on renal dialysis: Secondary | ICD-10-CM | POA: Diagnosis not present

## 2022-10-20 DIAGNOSIS — N2581 Secondary hyperparathyroidism of renal origin: Secondary | ICD-10-CM | POA: Diagnosis not present

## 2022-10-20 DIAGNOSIS — N186 End stage renal disease: Secondary | ICD-10-CM | POA: Diagnosis not present

## 2022-10-22 ENCOUNTER — Encounter (HOSPITAL_COMMUNITY): Payer: Medicare Other

## 2022-10-22 ENCOUNTER — Encounter: Payer: Medicare Other | Admitting: Vascular Surgery

## 2022-10-22 DIAGNOSIS — D631 Anemia in chronic kidney disease: Secondary | ICD-10-CM | POA: Diagnosis not present

## 2022-10-22 DIAGNOSIS — N2581 Secondary hyperparathyroidism of renal origin: Secondary | ICD-10-CM | POA: Diagnosis not present

## 2022-10-22 DIAGNOSIS — N186 End stage renal disease: Secondary | ICD-10-CM | POA: Diagnosis not present

## 2022-10-22 DIAGNOSIS — Z992 Dependence on renal dialysis: Secondary | ICD-10-CM | POA: Diagnosis not present

## 2022-10-24 DIAGNOSIS — N2581 Secondary hyperparathyroidism of renal origin: Secondary | ICD-10-CM | POA: Diagnosis not present

## 2022-10-24 DIAGNOSIS — D631 Anemia in chronic kidney disease: Secondary | ICD-10-CM | POA: Diagnosis not present

## 2022-10-24 DIAGNOSIS — Z992 Dependence on renal dialysis: Secondary | ICD-10-CM | POA: Diagnosis not present

## 2022-10-24 DIAGNOSIS — N186 End stage renal disease: Secondary | ICD-10-CM | POA: Diagnosis not present

## 2022-10-25 DIAGNOSIS — I10 Essential (primary) hypertension: Secondary | ICD-10-CM | POA: Diagnosis not present

## 2022-10-27 ENCOUNTER — Encounter (HOSPITAL_COMMUNITY)
Admission: RE | Disposition: A | Discharge status: Home / Self Care | Payer: Self-pay | Source: Home / Self Care | Attending: Vascular Surgery

## 2022-10-27 ENCOUNTER — Encounter (HOSPITAL_COMMUNITY): Payer: Self-pay | Admitting: Vascular Surgery

## 2022-10-27 ENCOUNTER — Ambulatory Visit (HOSPITAL_COMMUNITY)
Admission: RE | Admit: 2022-10-27 | Discharge: 2022-10-27 | Disposition: A | Discharge status: Home / Self Care | Payer: Medicare Other | Attending: Vascular Surgery | Admitting: Vascular Surgery

## 2022-10-27 DIAGNOSIS — T82868A Thrombosis of vascular prosthetic devices, implants and grafts, initial encounter: Secondary | ICD-10-CM

## 2022-10-27 DIAGNOSIS — N186 End stage renal disease: Secondary | ICD-10-CM | POA: Insufficient documentation

## 2022-10-27 DIAGNOSIS — Z992 Dependence on renal dialysis: Secondary | ICD-10-CM | POA: Insufficient documentation

## 2022-10-27 DIAGNOSIS — Y841 Kidney dialysis as the cause of abnormal reaction of the patient, or of later complication, without mention of misadventure at the time of the procedure: Secondary | ICD-10-CM | POA: Insufficient documentation

## 2022-10-27 DIAGNOSIS — L7632 Postprocedural hematoma of skin and subcutaneous tissue following other procedure: Secondary | ICD-10-CM | POA: Insufficient documentation

## 2022-10-27 DIAGNOSIS — Y838 Other surgical procedures as the cause of abnormal reaction of the patient, or of later complication, without mention of misadventure at the time of the procedure: Secondary | ICD-10-CM | POA: Diagnosis not present

## 2022-10-27 HISTORY — PX: UPPER EXTREMITY VENOGRAPHY: CATH118272

## 2022-10-27 LAB — POCT I-STAT, CHEM 8
BUN: 54 mg/dL — ABNORMAL HIGH (ref 8–23)
Calcium, Ion: 1 mmol/L — ABNORMAL LOW (ref 1.15–1.40)
Chloride: 101 mmol/L (ref 98–111)
Creatinine, Ser: 12 mg/dL — ABNORMAL HIGH (ref 0.44–1.00)
Glucose, Bld: 96 mg/dL (ref 70–99)
HCT: 32 % — ABNORMAL LOW (ref 36.0–46.0)
Hemoglobin: 10.9 g/dL — ABNORMAL LOW (ref 12.0–15.0)
Potassium: 4.5 mmol/L (ref 3.5–5.1)
Sodium: 138 mmol/L (ref 135–145)
TCO2: 26 mmol/L (ref 22–32)

## 2022-10-27 SURGERY — UPPER EXTREMITY VENOGRAPHY
Anesthesia: LOCAL

## 2022-10-27 MED ORDER — IODIXANOL 320 MG/ML IV SOLN
INTRAVENOUS | Status: DC | PRN
  Administered 2022-10-27: 55 mL via INTRAVENOUS

## 2022-10-27 MED ORDER — SODIUM CHLORIDE 0.9% FLUSH: 3.0000 mL | INTRAVENOUS | Status: DC | PRN

## 2022-10-27 MED ORDER — LIDOCAINE HCL (PF) 1 % IJ SOLN
INTRAMUSCULAR | Status: DC | PRN
  Administered 2022-10-27: 5 mL

## 2022-10-27 MED ORDER — LIDOCAINE HCL (PF) 1 % IJ SOLN
INTRAMUSCULAR | Status: AC
  Filled 2022-10-27: qty 30

## 2022-10-27 SURGICAL SUPPLY — 3 items
SHEATH PROBE COVER 6X72 (BAG) IMPLANT
STOPCOCK MORSE 400PSI 3WAY (MISCELLANEOUS) IMPLANT
TUBING CIL FLEX 10 FLL-RA (TUBING) IMPLANT

## 2022-10-27 NOTE — Interval H&P Note (Signed)
History and Physical Interval Note:  10/27/2022 7:12 AM  Jane [name]  has presented today for surgery, with the diagnosis of end stage renal disease.  The various methods of treatment have been discussed with the patient and family. After consideration of risks, benefits and other options for treatment, the patient has consented to  Procedure(s): UPPER EXTREMITY VENOGRAPHY (N/A) as a surgical intervention.  The patient's history has been reviewed, patient examined, no change in status, stable for surgery.  I have reviewed the patient's chart and labs.  Questions were answered to the patient's satisfaction.     Lemar Livings

## 2022-10-27 NOTE — Discharge Instructions (Signed)
Activity Rest as told by your health care provider. Return to your normal activities as told by your health care provider. Ask your health care provider what activities are safe for you. May shower and remove bandage 24 hours after discharge. If you were given a sedative during the procedure, it can affect you for several hours. Do not drive or operate machinery until your health care provider says that it is safe. General instructions      Check your IV insertion area every day for signs of infection. Check for: Redness, swelling, or pain. Fluid or blood. Warmth. Pus or a bad smell. Take over-the-counter and prescription medicines only as told by your health care provider. Contact a health care provider if: You have redness, swelling, warmth, pus, or pain around the IV insertion site. If you have redness, itchiness or hives contact your healthcare provider

## 2022-10-27 NOTE — Op Note (Signed)
    Patient name: Jane [name] MRN: [id number] DOB: 11-24-1945 Sex: female  10/27/2022 Pre-operative Diagnosis: End-stage renal disease, left arm postsurgical hematoma Post-operative diagnosis:  Same Surgeon:  Luanna Salk. Randie Heinz, MD Procedure Performed: 1.  Bilateral upper extremity venography 2.  Open drainage left upper arm hematoma   Indications: 77 year old female with end-stage renal disease previously underwent left brachiobasilic AV fistula creation approximately 6 weeks ago which is now known to be thrombosed.  She has a previous right forearm AV graft which has also thrombosed with multiple stents in the outflow.  She is now indicated for bilateral upper extremity venography to evaluate for future dialysis access options.  On presentation she was noted to have a very large hematoma in the left upper extremity which we are planning to drain at the time of procedure.  Findings: The left upper arm is very slow to drain centrally but does appear to have central patency.  On the right side there were multiple stents and what appeared to be a cephalic vein which are thrombosed but the axillary, subclavian and right innominate veins all appear patent.  Plan will be for right arm AV graft on a nondialysis day in the near future.  On drainage of hematoma in the left upper arm was noted to be approximately 100 cc of serosanguineous fluid.   Procedure:  The patient was identified in the holding area and taken to room 8.  The patient was then placed supine on the table and bilateral upper extremity venography was performed through her IVs that were placed in preoperative holding with the above findings we will plan for right arm AV graft.  Attention was then turned to the left arm which was sterilely prepped and draped at the site of the previous incision.  1% lidocaine was used to anesthetize the central aspect of the incision and a 1 cm incision was created and we drained approximately 100 cc of  serosanguineous fluid using compression.  4 Monocryl was then placed in the skin and a sterile dressing was applied and the arm was wrapped to prevent recurrence.  Patient tolerated the procedure well without immediate complication.  All counts were correct at completion.  Contrast: 55cc  EBL: 5cc    [name] C. Randie Heinz, MD [facility] Office: [phone] Pager: [phone]

## 2022-10-28 ENCOUNTER — Telehealth: Payer: Self-pay

## 2022-10-28 DIAGNOSIS — Z992 Dependence on renal dialysis: Secondary | ICD-10-CM | POA: Diagnosis not present

## 2022-10-28 DIAGNOSIS — N2581 Secondary hyperparathyroidism of renal origin: Secondary | ICD-10-CM | POA: Diagnosis not present

## 2022-10-28 DIAGNOSIS — N186 End stage renal disease: Secondary | ICD-10-CM | POA: Diagnosis not present

## 2022-10-28 DIAGNOSIS — D631 Anemia in chronic kidney disease: Secondary | ICD-10-CM | POA: Diagnosis not present

## 2022-10-28 NOTE — Telephone Encounter (Signed)
Caller: Pt's daughter, patient, HD center  Concern: Hematoma larger, hard, painful, warm to touch  Location: left arm  Description:  Immediately following arrival at home after d/c, pt felt drained hematoma site enlarging, pt removed dressing d/t pain becoming unbearable    Aggravating Factors: compression  Quality: pressure, tight (pulling), and uncomfortable  Treatments: none  Procedure: Bilateral upper extremity venography, Open drainage left upper arm hematoma on 10/28/22  Consulted: Corrie, PA  Resolution: Instructed patient to proceed to nearest ER  Next Appt: N/A

## 2022-10-29 ENCOUNTER — Encounter (HOSPITAL_COMMUNITY): Payer: Self-pay | Admitting: Vascular Surgery

## 2022-10-29 ENCOUNTER — Telehealth: Payer: Self-pay

## 2022-10-29 DIAGNOSIS — E1129 Type 2 diabetes mellitus with other diabetic kidney complication: Secondary | ICD-10-CM | POA: Diagnosis not present

## 2022-10-29 DIAGNOSIS — D631 Anemia in chronic kidney disease: Secondary | ICD-10-CM | POA: Diagnosis not present

## 2022-10-29 DIAGNOSIS — I4891 Unspecified atrial fibrillation: Secondary | ICD-10-CM | POA: Diagnosis not present

## 2022-10-29 DIAGNOSIS — N2581 Secondary hyperparathyroidism of renal origin: Secondary | ICD-10-CM | POA: Diagnosis not present

## 2022-10-29 DIAGNOSIS — Z992 Dependence on renal dialysis: Secondary | ICD-10-CM | POA: Diagnosis not present

## 2022-10-29 DIAGNOSIS — N186 End stage renal disease: Secondary | ICD-10-CM | POA: Diagnosis not present

## 2022-10-29 NOTE — Progress Notes (Signed)
Care Management & Coordination Services Pharmacy Team  Reason for Encounter: Medication coordination and delivery  Contacted patient to discuss medications and coordinate delivery from [facility]. Unsuccessful outreach. Left voicemail for patient to return call. Multiple calls  Cycle dispensing form sent to Delano MetzAngela Herring for review.   Last adherence delivery date:10/09/2022      Patient is due for next adherence delivery on: 11/10/2022  This delivery to include: Vials  90 Days (Confirm switch to 30 DS) Atorvastatin 10 mg - 1 tablet once daily Linzess 72 mcg - 1 capsule before breakfast  Famotidine 40mg  - 1 tablet twice daily Dialyvite 800 with zinc - take 1 tablet every morning Carvedilol 12.5 mg - 1 tablet twice daily Lansoprazole 30 - take twice daily Hydralazine 25 mg - 1/2 tablet 2 times daily (in epic) and fill hx is 50 mg 1 tab bid ( with [facility]) Unable to reach patient to verify how she is taking this medication.   Patient declined the following medications this month: Diltiazem 60 mg  - 1 tablet 3 times daily as needed for Afib   Delivery scheduled for 11/10/2022. Unable to speak with patient to confirm date.   Any concerns about your medications?  How often do you forget or accidentally miss a dose?  Do you use a pillbox? No  Recent blood pressure readings are as follows:  Recent blood glucose readings are as follows:   Chart review: Recent office visits:  None  Recent consult visits:  09/26/2022 Claudette LawsAndrew Kirsteins MD ([facility] Rehab) - Patient was seen for Spondylosis without myelopathy or radiculopathy, lumbar region and an additional concern. Started Tramadol 50 mg twice daily.   10/08/2022 Corrina Baglia PA-C (Vascular) - Patient was seen for ESRD on hemodialysis. Discontinued Methylprednisolone and Tramadol.   Hospital visits:  [facility] (4 hours) due to Upper Extremity Venography. Discharge date was  10/27/2022.    [facility] -None Medication Changes at Hospital Discharge: -None Medications Discontinued at Hospital Discharge: -None Medications that remain the same after Hospital Discharge:??  -All other medications will remain the same.    Medications: Outpatient Encounter Medications as of 10/29/2022  Medication Sig Note   atorvastatin (LIPITOR) 10 MG tablet Take 1 tablet (10 mg total) by mouth daily.    carvedilol (COREG) 12.5 MG tablet Take 1 tablet (12.5 mg total) by mouth 2 (two) times daily with a meal.    cinacalcet (SENSIPAR) 30 MG tablet Take 30 mg by mouth every Monday, Wednesday, and Friday with hemodialysis. Post dialysis 09/05/2022: Given at dialysis   diltiazem (CARDIZEM) 60 MG tablet Take 1 tablet (60 mg total) by mouth 3 (three) times daily as needed (Atrial fibrillation).    famotidine (PEPCID) 40 MG tablet Take 1 tablet (40 mg total) by mouth 2 (two) times daily.    folic acid-vitamin b complex-vitamin c-selenium-zinc (DIALYVITE) 3 MG TABS tablet Take 1 tablet by mouth at bedtime.    hydrALAZINE (APRESOLINE) 25 MG tablet Take 12.5 mg by mouth in the morning and at bedtime.    lansoprazole (PREVACID) 30 MG capsule TAKE ONE CAPSULE BY MOUTH TWICE DAILY    LINZESS 72 MCG capsule TAKE ONE CAPSULE BY MOUTH BEFORE BREAKFAST (Patient taking differently: Take 72 mcg by mouth every other day.)    Methoxy PEG-Epoetin Beta (MIRCERA IJ) Inject as directed. 09/05/2022: Given at dialysis   sevelamer carbonate (RENVELA) 800 MG tablet Take 1,600 mg by mouth 2 (two) times  daily with a meal.    traMADol (ULTRAM) 50 MG tablet Take 1 tablet (50 mg total) by mouth 2 (two) times daily.    Facility-Administered Encounter Medications as of 10/29/2022  Medication   0.9 %  sodium chloride infusion   betamethasone acetate-betamethasone sodium phosphate (CELESTONE) injection 12 mg   lidocaine (XYLOCAINE) 1 % (with pres) injection 4 mL   sodium chloride flush  (NS) 0.9 % injection 3 mL   BP Readings from Last 3 Encounters:  10/27/22 (!) 180/64  10/16/22 (!) 224/83  10/08/22 (!) 190/81    Pulse Readings from Last 3 Encounters:  10/27/22 76  10/16/22 66  10/08/22 73    No results found for: "HGBA1C" Lab Results  Component Value Date   CREATININE 12.00 (H) 10/27/2022   BUN 54 (H) 10/27/2022   GFR 2.96 (LL) 09/19/2021   GFRNONAA 7 (L) 07/04/2022   GFRAA 4 (L) 01/18/2020   NA 138 10/27/2022   K 4.5 10/27/2022   CALCIUM 8.7 (L) 07/04/2022   CO2 27 07/04/2022   [name] CMA  Clinical Pharmacist Assistant [phone]

## 2022-10-30 ENCOUNTER — Other Ambulatory Visit: Payer: Self-pay

## 2022-10-30 ENCOUNTER — Encounter (HOSPITAL_COMMUNITY): Payer: Self-pay

## 2022-10-30 ENCOUNTER — Inpatient Hospital Stay (HOSPITAL_COMMUNITY)
Admission: EM | Admit: 2022-10-30 | Discharge: 2022-11-01 | DRG: 987 | Disposition: A | Discharge status: Home / Self Care | Payer: Medicare Other | Attending: Vascular Surgery | Admitting: Vascular Surgery

## 2022-10-30 DIAGNOSIS — D631 Anemia in chronic kidney disease: Secondary | ICD-10-CM | POA: Diagnosis present

## 2022-10-30 DIAGNOSIS — T82838A Hemorrhage of vascular prosthetic devices, implants and grafts, initial encounter: Secondary | ICD-10-CM | POA: Diagnosis not present

## 2022-10-30 DIAGNOSIS — T148XXA Other injury of unspecified body region, initial encounter: Secondary | ICD-10-CM | POA: Diagnosis present

## 2022-10-30 DIAGNOSIS — Z79899 Other long term (current) drug therapy: Secondary | ICD-10-CM | POA: Diagnosis not present

## 2022-10-30 DIAGNOSIS — Z8249 Family history of ischemic heart disease and other diseases of the circulatory system: Secondary | ICD-10-CM

## 2022-10-30 DIAGNOSIS — I480 Paroxysmal atrial fibrillation: Secondary | ICD-10-CM | POA: Diagnosis present

## 2022-10-30 DIAGNOSIS — Z5941 Food insecurity: Secondary | ICD-10-CM

## 2022-10-30 DIAGNOSIS — L7632 Postprocedural hematoma of skin and subcutaneous tissue following other procedure: Secondary | ICD-10-CM | POA: Diagnosis not present

## 2022-10-30 DIAGNOSIS — M7981 Nontraumatic hematoma of soft tissue: Secondary | ICD-10-CM | POA: Diagnosis present

## 2022-10-30 DIAGNOSIS — Z5982 Transportation insecurity: Secondary | ICD-10-CM

## 2022-10-30 DIAGNOSIS — Y832 Surgical operation with anastomosis, bypass or graft as the cause of abnormal reaction of the patient, or of later complication, without mention of misadventure at the time of the procedure: Secondary | ICD-10-CM | POA: Diagnosis present

## 2022-10-30 DIAGNOSIS — I120 Hypertensive chronic kidney disease with stage 5 chronic kidney disease or end stage renal disease: Secondary | ICD-10-CM | POA: Diagnosis not present

## 2022-10-30 DIAGNOSIS — Z96652 Presence of left artificial knee joint: Secondary | ICD-10-CM | POA: Diagnosis present

## 2022-10-30 DIAGNOSIS — Z992 Dependence on renal dialysis: Secondary | ICD-10-CM

## 2022-10-30 DIAGNOSIS — N186 End stage renal disease: Secondary | ICD-10-CM | POA: Diagnosis present

## 2022-10-30 DIAGNOSIS — Z91040 Latex allergy status: Secondary | ICD-10-CM

## 2022-10-30 DIAGNOSIS — N2581 Secondary hyperparathyroidism of renal origin: Secondary | ICD-10-CM | POA: Diagnosis present

## 2022-10-30 DIAGNOSIS — E8770 Fluid overload, unspecified: Secondary | ICD-10-CM | POA: Diagnosis present

## 2022-10-30 DIAGNOSIS — Z5986 Financial insecurity: Secondary | ICD-10-CM | POA: Diagnosis not present

## 2022-10-30 DIAGNOSIS — M7989 Other specified soft tissue disorders: Secondary | ICD-10-CM | POA: Diagnosis present

## 2022-10-30 DIAGNOSIS — Z7901 Long term (current) use of anticoagulants: Secondary | ICD-10-CM | POA: Diagnosis not present

## 2022-10-30 DIAGNOSIS — Z95818 Presence of other cardiac implants and grafts: Secondary | ICD-10-CM

## 2022-10-30 DIAGNOSIS — K219 Gastro-esophageal reflux disease without esophagitis: Secondary | ICD-10-CM | POA: Diagnosis present

## 2022-10-30 DIAGNOSIS — Z823 Family history of stroke: Secondary | ICD-10-CM

## 2022-10-30 DIAGNOSIS — I9789 Other postprocedural complications and disorders of the circulatory system, not elsewhere classified: Secondary | ICD-10-CM | POA: Diagnosis not present

## 2022-10-30 DIAGNOSIS — J45909 Unspecified asthma, uncomplicated: Secondary | ICD-10-CM | POA: Diagnosis present

## 2022-10-30 DIAGNOSIS — Z882 Allergy status to sulfonamides status: Secondary | ICD-10-CM | POA: Diagnosis not present

## 2022-10-30 DIAGNOSIS — N250 Renal osteodystrophy: Secondary | ICD-10-CM | POA: Diagnosis not present

## 2022-10-30 LAB — BASIC METABOLIC PANEL
Anion gap: 16 — ABNORMAL HIGH (ref 5–15)
BUN: 37 mg/dL — ABNORMAL HIGH (ref 8–23)
CO2: 27 mmol/L (ref 22–32)
Calcium: 9.4 mg/dL (ref 8.9–10.3)
Chloride: 93 mmol/L — ABNORMAL LOW (ref 98–111)
Creatinine, Ser: 7.71 mg/dL — ABNORMAL HIGH (ref 0.44–1.00)
GFR, Estimated: 5 mL/min — ABNORMAL LOW (ref >60)
Glucose, Bld: 131 mg/dL — ABNORMAL HIGH (ref 70–99)
Potassium: 4.1 mmol/L (ref 3.5–5.1)
Sodium: 136 mmol/L (ref 135–145)

## 2022-10-30 LAB — COMPREHENSIVE METABOLIC PANEL
ALT: 16 U/L (ref 0–44)
AST: 21 U/L (ref 15–41)
Albumin: 3.3 g/dL — ABNORMAL LOW (ref 3.5–5.0)
Alkaline Phosphatase: 158 U/L — ABNORMAL HIGH (ref 38–126)
Anion gap: 11 (ref 5–15)
BUN: 39 mg/dL — ABNORMAL HIGH (ref 8–23)
CO2: 30 mmol/L (ref 22–32)
Calcium: 9.1 mg/dL (ref 8.9–10.3)
Chloride: 95 mmol/L — ABNORMAL LOW (ref 98–111)
Creatinine, Ser: 8.1 mg/dL — ABNORMAL HIGH (ref 0.44–1.00)
GFR, Estimated: 5 mL/min — ABNORMAL LOW (ref >60)
Glucose, Bld: 121 mg/dL — ABNORMAL HIGH (ref 70–99)
Potassium: 4.2 mmol/L (ref 3.5–5.1)
Sodium: 136 mmol/L (ref 135–145)
Total Bilirubin: 0.5 mg/dL (ref 0.3–1.2)
Total Protein: 6.9 g/dL (ref 6.5–8.1)

## 2022-10-30 LAB — CBC WITH DIFFERENTIAL/PLATELET
Abs Immature Granulocytes: 0 10*3/uL (ref 0.00–0.07)
Basophils Absolute: 0.1 10*3/uL (ref 0.0–0.1)
Basophils Relative: 2 %
Eosinophils Absolute: 0.9 10*3/uL — ABNORMAL HIGH (ref 0.0–0.5)
Eosinophils Relative: 17 %
HCT: 34.7 % — ABNORMAL LOW (ref 36.0–46.0)
Hemoglobin: 11 g/dL — ABNORMAL LOW (ref 12.0–15.0)
Lymphocytes Relative: 31 %
Lymphs Abs: 1.6 10*3/uL (ref 0.7–4.0)
MCH: 28.9 pg (ref 26.0–34.0)
MCHC: 31.7 g/dL (ref 30.0–36.0)
MCV: 91.3 fL (ref 80.0–100.0)
Monocytes Absolute: 0.6 10*3/uL (ref 0.1–1.0)
Monocytes Relative: 12 %
Neutro Abs: 2 10*3/uL (ref 1.7–7.7)
Neutrophils Relative %: 38 %
Platelets: 205 10*3/uL (ref 150–400)
RBC: 3.8 MIL/uL — ABNORMAL LOW (ref 3.87–5.11)
RDW: 16.4 % — ABNORMAL HIGH (ref 11.5–15.5)
WBC: 5.3 10*3/uL (ref 4.0–10.5)
nRBC: 0 % (ref 0.0–0.2)

## 2022-10-30 LAB — CBC
HCT: 32.5 % — ABNORMAL LOW (ref 36.0–46.0)
Hemoglobin: 10.2 g/dL — ABNORMAL LOW (ref 12.0–15.0)
MCH: 28.7 pg (ref 26.0–34.0)
MCHC: 31.4 g/dL (ref 30.0–36.0)
MCV: 91.3 fL (ref 80.0–100.0)
Platelets: 201 10*3/uL (ref 150–400)
RBC: 3.56 MIL/uL — ABNORMAL LOW (ref 3.87–5.11)
RDW: 16.4 % — ABNORMAL HIGH (ref 11.5–15.5)
WBC: 5.4 10*3/uL (ref 4.0–10.5)
nRBC: 0 % (ref 0.0–0.2)

## 2022-10-30 LAB — TYPE AND SCREEN
ABO/RH(D): O POS
Antibody Screen: NEGATIVE

## 2022-10-30 LAB — PROTIME-INR
INR: 1.3 — ABNORMAL HIGH (ref 0.8–1.2)
Prothrombin Time: 15.6 seconds — ABNORMAL HIGH (ref 11.4–15.2)

## 2022-10-30 MED ORDER — ALUM & MAG HYDROXIDE-SIMETH 200-200-20 MG/5ML PO SUSP: 15.0000 mL | ORAL | Status: DC | PRN

## 2022-10-30 MED ORDER — METOPROLOL TARTRATE 5 MG/5ML IV SOLN: 2.0000 mg | INTRAVENOUS | Status: DC | PRN

## 2022-10-30 MED ORDER — HEPARIN SODIUM (PORCINE) 5000 UNIT/ML IJ SOLN
5000.0000 [IU] | Freq: Three times a day (TID) | INTRAMUSCULAR | Status: DC
  Administered 2022-10-30 – 2022-11-01 (×5): 5000 [IU] via SUBCUTANEOUS
  Filled 2022-10-30 (×5): qty 1

## 2022-10-30 MED ORDER — GUAIFENESIN-DM 100-10 MG/5ML PO SYRP: 15.0000 mL | ORAL_SOLUTION | ORAL | Status: DC | PRN

## 2022-10-30 MED ORDER — ONDANSETRON HCL 4 MG/2ML IJ SOLN: 4.0000 mg | Freq: Four times a day (QID) | INTRAMUSCULAR | Status: DC | PRN

## 2022-10-30 MED ORDER — CHLORHEXIDINE GLUCONATE CLOTH 2 % EX PADS
6.0000 | MEDICATED_PAD | Freq: Every day | CUTANEOUS | Status: DC
  Administered 2022-10-30 – 2022-11-01 (×3): 6 via TOPICAL

## 2022-10-30 MED ORDER — ACETAMINOPHEN 325 MG PO TABS: 650.0000 mg | ORAL_TABLET | Freq: Four times a day (QID) | ORAL | Status: DC | PRN

## 2022-10-30 MED ORDER — HYDRALAZINE HCL 20 MG/ML IJ SOLN
5.0000 mg | INTRAMUSCULAR | Status: DC | PRN
  Administered 2022-11-01: 5 mg via INTRAVENOUS
  Filled 2022-10-30: qty 1

## 2022-10-30 MED ORDER — HYDROMORPHONE HCL 1 MG/ML IJ SOLN: 0.5000 mg | INTRAMUSCULAR | Status: DC | PRN

## 2022-10-30 MED ORDER — HYDROMORPHONE HCL 2 MG PO TABS
2.0000 mg | ORAL_TABLET | ORAL | Status: DC | PRN
  Administered 2022-10-31 (×2): 2 mg via ORAL
  Filled 2022-10-30 (×2): qty 1

## 2022-10-30 MED ORDER — PHENOL 1.4 % MT LIQD: 1.0000 | OROMUCOSAL | Status: DC | PRN

## 2022-10-30 MED ORDER — PANTOPRAZOLE SODIUM 40 MG PO TBEC
40.0000 mg | DELAYED_RELEASE_TABLET | Freq: Every day | ORAL | Status: DC
  Administered 2022-10-30 – 2022-11-01 (×3): 40 mg via ORAL
  Filled 2022-10-30 (×3): qty 1

## 2022-10-30 MED ORDER — LABETALOL HCL 5 MG/ML IV SOLN: 10.0000 mg | INTRAVENOUS | Status: DC | PRN

## 2022-10-30 MED ORDER — POTASSIUM CHLORIDE CRYS ER 20 MEQ PO TBCR
20.0000 meq | EXTENDED_RELEASE_TABLET | Freq: Once | ORAL | Status: AC
  Administered 2022-10-30: 20 meq via ORAL
  Filled 2022-10-30: qty 1

## 2022-10-30 NOTE — ED Notes (Signed)
ED TO INPATIENT HANDOFF REPORT  ED Nurse Name and Phone #: Les Pou RN [phone]  S Name/Age/Gender Jane [name] 77 y.o. female Room/Bed: 016C/016C  Code Status   Code Status: Full Code  Home/SNF/Other Home Patient oriented to: self, place, time, and situation Is this baseline? Yes   Triage Complete: Triage complete  Chief Complaint Hematoma [T14.8XXA]  Triage Note L AV fistula placed 6 weeks ago, thrombosed; attempted to clean up Monday ; hematoma developed after;  dialysis Tuesday, Wednesday this week; hematoma increased in size, started oozing blood today; last full dialysis session yesterday; endorses pain at site going down to hand   Allergies Allergies  Allergen Reactions   Latex Rash   Penicillins Other (See Comments)    Yeast infection / Childhood / Rash   Sulfa Antibiotics Rash   Tape Other (See Comments)    Plastic, silicone, and paper tape causes bruising and pulls off skin. Cloth tape works fine    Level of Care/Admitting Diagnosis ED Disposition     ED Disposition  Admit   Condition  --   Comment  Hospital Area: [facility] [[id number]]  Level of Care: Progressive [102]  Admit to Progressive based on following criteria: CARDIOVASCULAR & THORACIC of moderate stability with acute coronary syndrome symptoms/low risk myocardial infarction/hypertensive urgency/arrhythmias/heart failure potentially compromising stability and stable post cardiovascular intervention patients.  [facility] patient in [facility] at [facility] or [facility] if equivalent level of care is available:: Yes  Covid Evaluation: Asymptomatic - no recent exposure (last 10 days) testing not required  Diagnosis: Hematoma [244362]  Admitting Physician: Leonie Douglas [[id number]]  Attending Physician: Leonie Douglas [[id number]]          B Medical/Surgery History Past Medical History:  Diagnosis Date   Acid reflux    Anemia of chronic disease    Arthritis    Asthma     AVM (arteriovenous malformation)    Bilateral carotid bruits    Diverticulitis    Duodenal ulcer    Dysrhythmia    a-fib   ESRD (end stage renal disease)    [facility]   Hemorrhoids    History of blood transfusion    Hypertension    Malaise and fatigue    Orthostatic hypotension    PAF (paroxysmal atrial fibrillation)    Presence of Watchman left atrial appendage closure device 02/06/2022   Watchman FLX 21mm with Dr. Excell Seltzer   Sleep apnea    does not need cpap per patient   Syncope    Tubulovillous adenoma of colon    Past Surgical History:  Procedure Laterality Date   A/V FISTULAGRAM Right 10/18/2020   Procedure: A/V FISTULAGRAM;  Surgeon: Cephus Shelling, MD;  [facility]: [facility] INVASIVE CV LAB;  Service: Cardiovascular;  Laterality: Right;   A/V FISTULAGRAM Right 09/05/2021   Procedure: A/V Fistulagram;  Surgeon: Cephus Shelling, MD;  Location: [facility] INVASIVE CV LAB;  Service: Cardiovascular;  Laterality: Right;   AV FISTULA PLACEMENT     AV FISTULA PLACEMENT Left 09/09/2022   Procedure: BRACHIOBASILIC ARTERIOVENOUS (AV) FISTULA CREATION;  Surgeon: Maeola Harman, MD;  Location: [facility] OR;  Service: Vascular;  Laterality: Left;   BACK SURGERY     Lumbar fusion L 4 and L 5   BIOPSY  01/09/2020   Procedure: BIOPSY;  Surgeon: Lemar Lofty., MD;  Location: [facility] ENDOSCOPY;  Service: Gastroenterology;;   BIOPSY  01/31/2021   Procedure: BIOPSY;  Surgeon: Corliss Parish  Montez Hageman., MD;  Location: [facility] ENDOSCOPY;  Service: Gastroenterology;;   BIOPSY  09/26/2021   Procedure: BIOPSY;  Surgeon: Tressia Danas, MD;  Location: [facility] ENDOSCOPY;  Service: Gastroenterology;;   COLONOSCOPY     COLONOSCOPY N/A 02/01/2021   Procedure: COLONOSCOPY;  Surgeon: Benancio Deeds, MD;  Location: [facility] ENDOSCOPY;  Service: Gastroenterology;  Laterality: N/A;   COLONOSCOPY WITH PROPOFOL N/A 09/26/2021   Procedure: COLONOSCOPY WITH PROPOFOL;  Surgeon: Tressia Danas, MD;  Location:  [facility] ENDOSCOPY;  Service: Gastroenterology;  Laterality: N/A;   COLONOSCOPY WITH PROPOFOL N/A 09/29/2021   Procedure: COLONOSCOPY WITH PROPOFOL;  Surgeon: Tressia Danas, MD;  Location: [facility] ENDOSCOPY;  Service: Gastroenterology;  Laterality: N/A;   COLONOSCOPY WITH PROPOFOL N/A 10/01/2021   Procedure: COLONOSCOPY WITH PROPOFOL;  Surgeon: Napoleon Form, MD;  Location: [facility] ENDOSCOPY;  Service: Gastroenterology;  Laterality: N/A;   ENDOSCOPIC MUCOSAL RESECTION N/A 01/09/2020   Procedure: ENDOSCOPIC MUCOSAL RESECTION;  Surgeon: Meridee Score Netty Starring., MD;  Location: [facility] ENDOSCOPY;  Service: Gastroenterology;  Laterality: N/A;   ENDOSCOPIC MUCOSAL RESECTION N/A 01/31/2021   Procedure: ENDOSCOPIC MUCOSAL RESECTION;  Surgeon: Meridee Score Netty Starring., MD;  Location: [facility] ENDOSCOPY;  Service: Gastroenterology;  Laterality: N/A;   ENTEROSCOPY N/A 09/26/2021   Procedure: ENTEROSCOPY;  Surgeon: Tressia Danas, MD;  Location: [facility] ENDOSCOPY;  Service: Gastroenterology;  Laterality: N/A;   ESOPHAGOGASTRODUODENOSCOPY     ESOPHAGOGASTRODUODENOSCOPY (EGD) WITH PROPOFOL N/A 01/09/2020   Procedure: ESOPHAGOGASTRODUODENOSCOPY (EGD) WITH PROPOFOL;  Surgeon: Meridee Score Netty Starring., MD;  Location: [facility] ENDOSCOPY;  Service: Gastroenterology;  Laterality: N/A;   ESOPHAGOGASTRODUODENOSCOPY (EGD) WITH PROPOFOL N/A 01/13/2020   Procedure: ESOPHAGOGASTRODUODENOSCOPY (EGD) WITH PROPOFOL;  Surgeon: Hilarie Fredrickson, MD;  Location: [facility] ENDOSCOPY;  Service: Gastroenterology;  Laterality: N/A;   ESOPHAGOGASTRODUODENOSCOPY (EGD) WITH PROPOFOL N/A 01/31/2021   Procedure: ESOPHAGOGASTRODUODENOSCOPY (EGD) WITH PROPOFOL;  Surgeon: Meridee Score Netty Starring., MD;  Location: [facility] ENDOSCOPY;  Service: Gastroenterology;  Laterality: N/A;   EUS N/A 01/09/2020   Procedure: UPPER ENDOSCOPIC ULTRASOUND (EUS) RADIAL;  Surgeon: Lemar Lofty., MD;  Location: [facility] ENDOSCOPY;  Service: Gastroenterology;  Laterality: N/A;   FALSE ANEURYSM REPAIR Right  07/26/2022   Procedure: REPAIR RIGHT RADIO-CEPHALIC FISTULA ANEURYSM;  Surgeon: Victorino Sparrow, MD;  Location: [facility] OR;  Service: Vascular;  Laterality: Right;   GIVENS CAPSULE STUDY  09/29/2021   Procedure: GIVENS CAPSULE STUDY;  Surgeon: Tressia Danas, MD;  Location: [facility] ENDOSCOPY;  Service: Gastroenterology;;   HEMOSTASIS CLIP PLACEMENT  01/09/2020   Procedure: HEMOSTASIS CLIP PLACEMENT;  Surgeon: Lemar Lofty., MD;  Location: [facility] ENDOSCOPY;  Service: Gastroenterology;;   HEMOSTASIS CLIP PLACEMENT  01/31/2021   Procedure: HEMOSTASIS CLIP PLACEMENT;  Surgeon: Lemar Lofty., MD;  Location: [facility] ENDOSCOPY;  Service: Gastroenterology;;   HEMOSTASIS CONTROL  01/13/2020   Procedure: HEMOSTASIS CONTROL;  Surgeon: Hilarie Fredrickson, MD;  Location: [facility] ENDOSCOPY;  Service: Gastroenterology;;  hemaspray   HOT HEMOSTASIS N/A 01/13/2020   Procedure: HOT HEMOSTASIS (ARGON PLASMA COAGULATION/BICAP);  Surgeon: Hilarie Fredrickson, MD;  Location: [facility] ENDOSCOPY;  Service: Gastroenterology;  Laterality: N/A;   HOT HEMOSTASIS N/A 02/01/2021   Procedure: HOT HEMOSTASIS (ARGON PLASMA COAGULATION/BICAP);  Surgeon: Benancio Deeds, MD;  Location: [facility] ENDOSCOPY;  Service: Gastroenterology;  Laterality: N/A;   HOT HEMOSTASIS N/A 10/01/2021   Procedure: HOT HEMOSTASIS (ARGON PLASMA COAGULATION/BICAP);  Surgeon: Napoleon Form, MD;  Location: [facility] ENDOSCOPY;  Service: Gastroenterology;  Laterality: N/A;   INSERTION OF DIALYSIS CATHETER Left 07/26/2022   Procedure: INSERTION OF TUNNELED DIALYSIS CATHETER;  Surgeon: Gerarda Fraction  E, MD;  [facility] OR;  Service: Vascular;  Laterality: Left;   IR GENERIC HISTORICAL  07/09/2016   IR US GUIDE VASC ACCESS RIGHT 07/09/2016 Oley Balm, MD [facility]   IR GENERIC HISTORICAL  07/09/2016   IR FLUORO GUIDE CV LINE RIGHT 07/09/2016 Oley Balm, MD [facility]   KNEE ARTHROPLASTY Left    LAPAROSCOPIC SIGMOID COLECTOMY N/A 07/11/2016   Procedure: LAPAROSCOPIC  SIGMOID COLECTOMY;  Surgeon: Berna Bue, MD;  Location[facility] OR;  Service: General;  Laterality: N/A;   LEFT ATRIAL APPENDAGE OCCLUSION N/A 02/06/2022   Procedure: LEFT ATRIAL APPENDAGE OCCLUSION;  Surgeon: Tonny Bollman, MD;  Location: [facility] INVASIVE CV LAB;  Service: Cardiovascular;  Laterality: N/A;   LIGATION OF ARTERIOVENOUS  FISTULA Right 07/26/2022   Procedure: LIGATION OF RIGHT RADIO-CEPHALIC ARTERIOVENOUS  FISTULA;  Surgeon: Victorino Sparrow, MD;  Location: [facility] OR;  Service: Vascular;  Laterality: Right;   MASS EXCISION Right 04/10/2020   Procedure: EXCISION SKIN NODULE RIGHT FOREARM;  Surgeon: Maeola Harman, MD;  Location: [facility] OR;  Service: Vascular;  Laterality: Right;   POLYPECTOMY  09/26/2021   Procedure: POLYPECTOMY;  Surgeon: Tressia Danas, MD;  Location: [facility] ENDOSCOPY;  Service: Gastroenterology;;   POLYPECTOMY  10/01/2021   Procedure: POLYPECTOMY;  Surgeon: Napoleon Form, MD;  Location: [facility] ENDOSCOPY;  Service: Gastroenterology;;   REVISON OF ARTERIOVENOUS FISTULA Right 07/04/2022   Procedure: REVISON OF RIGHT FOREARM ARTERIOVENOUS FISTULA;  Surgeon: Leonie Douglas, MD;  Location: [facility] OR;  Service: Vascular;  Laterality: Right;   SCLEROTHERAPY  01/09/2020   Procedure: Susa Day;  Surgeon: Mansouraty, Netty Starring., MD;  Location: [facility] ENDOSCOPY;  Service: Gastroenterology;;   Susa Day  01/13/2020   Procedure: Susa Day;  Surgeon: Hilarie Fredrickson, MD;  Location: [facility] ENDOSCOPY;  Service: Gastroenterology;;   SUBMUCOSAL LIFTING INJECTION  01/09/2020   Procedure: SUBMUCOSAL LIFTING INJECTION;  Surgeon: Lemar Lofty., MD;  Location: [facility] ENDOSCOPY;  Service: Gastroenterology;;   TEE WITHOUT CARDIOVERSION N/A 02/06/2022   Procedure: TRANSESOPHAGEAL ECHOCARDIOGRAM (TEE);  Surgeon: Tonny Bollman, MD;  Location: [facility] INVASIVE CV LAB;  Service: Cardiovascular;  Laterality: N/A;   TUBAL LIGATION     UPPER EXTREMITY VENOGRAPHY N/A 10/27/2022   Procedure: UPPER  EXTREMITY VENOGRAPHY;  Surgeon: Maeola Harman, MD;  Location: [facility] INVASIVE CV LAB;  Service: Cardiovascular;  Laterality: N/A;     A IV Location/Drains/Wounds Patient Lines/Drains/Airways Status     Active Line/Drains/Airways     Name Placement date Placement time Site Days   Peripheral IV 10/30/22 20 G Right Forearm 10/30/22  1942  Forearm  less than 1   Fistula / Graft Left Upper arm Arteriovenous fistula 09/09/22  1032  Upper arm  51            Intake/Output Last 24 hours No intake or output data in the 24 hours ending 10/30/22 1946  Labs/Imaging Results for orders placed or performed during the hospital encounter of 10/30/22 (from the past 48 hour(s))  Basic metabolic panel     Status: Abnormal   Collection Time: 10/30/22  6:14 PM  Result Value Ref Range   Sodium 136 135 - 145 mmol/L   Potassium 4.1 3.5 - 5.1 mmol/L   Chloride 93 (L) 98 - 111 mmol/L   CO2 27 22 - 32 mmol/L   Glucose, Bld 131 (H) 70 - 99 mg/dL    Comment: Glucose reference range applies only to samples taken after fasting for at least 8 hours.   BUN 37 (H)  8 - 23 mg/dL   Creatinine, Ser 4.09 (H) 0.44 - 1.00 mg/dL   Calcium 9.4 8.9 - 81.1 mg/dL   GFR, Estimated 5 (L) >60 mL/min    Comment: (NOTE) Calculated using the CKD-EPI Creatinine Equation (2021)    Anion gap 16 (H) 5 - 15    Comment: Performed at [facility], [address] [place], [place] [address]  CBC with Differential     Status: Abnormal   Collection Time: 10/30/22  6:14 PM  Result Value Ref Range   WBC 5.3 4.0 - 10.5 K/uL   RBC 3.80 (L) 3.87 - 5.11 MIL/uL   Hemoglobin 11.0 (L) 12.0 - 15.0 g/dL   HCT 29.5 (L) 62.1 - 30.8 %   MCV 91.3 80.0 - 100.0 fL   MCH 28.9 26.0 - 34.0 pg   MCHC 31.7 30.0 - 36.0 g/dL   RDW 65.7 (H) 84.6 - 96.2 %   Platelets 205 150 - 400 K/uL   nRBC 0.0 0.0 - 0.2 %   Neutrophils Relative % 38 %   Neutro Abs 2.0 1.7 - 7.7 K/uL   Lymphocytes Relative 31 %   Lymphs Abs 1.6 0.7 - 4.0 K/uL    Monocytes Relative 12 %   Monocytes Absolute 0.6 0.1 - 1.0 K/uL   Eosinophils Relative 17 %   Eosinophils Absolute 0.9 (H) 0.0 - 0.5 K/uL   Basophils Relative 2 %   Basophils Absolute 0.1 0.0 - 0.1 K/uL   WBC Morphology MORPHOLOGY UNREMARKABLE    RBC Morphology MORPHOLOGY UNREMARKABLE    Smear Review MORPHOLOGY UNREMARKABLE    Abs Immature Granulocytes 0.00 0.00 - 0.07 K/uL    Comment: Performed at [facility], [address], [place], [place] [address]  Type and screen [facility]     Status: None (Preliminary result)   Collection Time: 10/30/22  7:12 PM  Result Value Ref Range   ABO/RH(D) PENDING    Antibody Screen PENDING    Sample Expiration      11/02/2022,2359 Performed at [facility], [address] [place], [place] [address]    No results found.  Pending Labs Unresulted Labs (From admission, onward)     Start     Ordered   10/30/22 1939  CBC  Once,   R        10/30/22 1939   10/30/22 1939  Comprehensive metabolic panel  Once,   R        10/30/22 1939   10/30/22 1939  Protime-INR  Once,   R        10/30/22 1939            Vitals/Pain Today's Vitals   10/30/22 1741 10/30/22 1759 10/30/22 1900 10/30/22 1912  BP: (!) 157/54  (!) 111/58   Pulse: 82  (!) 57   Resp: 18  15   Temp: 99 F (37.2 C)     TempSrc: Oral     SpO2: 98%  100%   PainSc:  6   0-No pain    Isolation Precautions No active isolations  Medications Medications  potassium chloride SA (KLOR-CON M) CR tablet 20-40 mEq (has no administration in time range)  ondansetron (ZOFRAN) injection 4 mg (has no administration in time range)  alum & mag hydroxide-simeth (MAALOX/MYLANTA) 200-200-20 MG/5ML suspension 15-30 mL (has no administration in time range)  pantoprazole (PROTONIX) EC tablet 40 mg (has no administration in time range)  labetalol (NORMODYNE) injection 10 mg (  has no administration in time range)  hydrALAZINE (APRESOLINE) injection 5 mg (has no  administration in time range)  metoprolol tartrate (LOPRESSOR) injection 2-5 mg (has no administration in time range)  guaiFENesin-dextromethorphan (ROBITUSSIN DM) 100-10 MG/5ML syrup 15 mL (has no administration in time range)  phenol (CHLORASEPTIC) mouth spray 1 spray (has no administration in time range)  heparin injection 5,000 Units (has no administration in time range)  acetaminophen (TYLENOL) tablet 650 mg (has no administration in time range)  HYDROmorphone (DILAUDID) injection 0.5 mg (has no administration in time range)  HYDROmorphone (DILAUDID) tablet 2 mg (has no administration in time range)    Mobility walks     Focused Assessments    R Recommendations: See Admitting Provider Note  Report given to:   Additional Notes:

## 2022-10-30 NOTE — ED Provider Notes (Signed)
I saw and evaluated the patient, reviewed the resident's note and I agree with the findings and plan.   Patient presents due to bleeding from her left dialysis AV fistula.  Neurovasc intact at her left hand.  Have consulted vascular surgery who will come to see   Lorre Nick, MD 10/30/22 1919

## 2022-10-30 NOTE — H&P (Signed)
[facility] [place]  ASSESSMENT / PLAN: 77 y.o. female with ESRD with left arm basilic vein fistula. She has recurrent left arm hematoma after incision and drainage 3 days ago with some surrounding ecchymosis.  Will plan to wash this out in the operating room tomorrow with Dr. Chestine Sporelark.  Keep n.p.o. after midnight. Will call nephrology team in the morning to make a plan for her dialysis treatment.  CHIEF COMPLAINT: left arm swelling and bleeding after I&D  HISTORY OF PRESENT ILLNESS: Jane [name] is a 77 y.o. female who presents to the hospital for evaluation of bleeding from her left arm.  She underwent a venogram and incision and drainage of left antecubital fossa hematoma on 10/27/2022.  Prior to this, she had undergone left arm basilic vein transposition on 09/09/2022.  After the basilic vein transposition, she developed a fluid collection in the antecubital fossa incision.  The fistula has not been functional and has limited flow through it.  She has been dialyzing through a right tunneled dialysis catheter.  A simple incision and drainage and closure was performed in the Cath Lab on 10/27/2022.  The cavity refilled with fluid on 10/29/2022 and she noticed bleeding.  She presented to care today because of persistent swelling and bleeding.  She has only had mild saturation of bandages which appeared to have been placed several days ago.  Past Medical History:  Diagnosis Date   Acid reflux    Anemia of chronic disease    Arthritis    Asthma    AVM (arteriovenous malformation)    Bilateral carotid bruits    Diverticulitis    Duodenal ulcer    Dysrhythmia    a-fib   ESRD (end stage renal disease)    [facility]   Hemorrhoids    History of blood transfusion    Hypertension    Malaise and fatigue    Orthostatic hypotension    PAF (paroxysmal atrial fibrillation)    Presence of Watchman left atrial appendage closure device 02/06/2022   Watchman FLX 27mm with Dr.  Excell Seltzerooper   Sleep apnea    does not need cpap per patient   Syncope    Tubulovillous adenoma of colon     Past Surgical History:  Procedure Laterality Date   A/V FISTULAGRAM Right 10/18/2020   Procedure: A/V FISTULAGRAM;  Surgeon: Cephus Shellinglark, Christopher J, MD;  [facility];  Service: Cardiovascular;  Laterality: Right;   A/V FISTULAGRAM Right 09/05/2021   Procedure: A/V Fistulagram;  Surgeon: Cephus Shellinglark, Christopher J, MD;  [facility];  Service: Cardiovascular;  Laterality: Right;   AV FISTULA PLACEMENT     AV FISTULA PLACEMENT Left 09/09/2022   Procedure: BRACHIOBASILIC ARTERIOVENOUS (AV) FISTULA CREATION;  Surgeon: Maeola Harmanain, Brandon Christopher, MD;  [facility];  Service: Vascular;  Laterality: Left;   BACK SURGERY     Lumbar fusion L 4 and L 5   BIOPSY  01/09/2020   Procedure: BIOPSY;  Surgeon: Lemar LoftyMansouraty, Gabriel Jr., MD;  [facility];  Service: Gastroenterology;;   BIOPSY  01/31/2021   Procedure: BIOPSY;  Surgeon: Lemar LoftyMansouraty, Gabriel Jr., MD;  [facility];  Service: Gastroenterology;;   BIOPSY  09/26/2021   Procedure: BIOPSY;  Surgeon: Tressia DanasBeavers, Kimberly, MD;  [facility];  Service: Gastroenterology;;   COLONOSCOPY     COLONOSCOPY N/A 02/01/2021   Procedure: COLONOSCOPY;  Surgeon: Benancio DeedsArmbruster, Steven P, MD;  [facility];  Service: Gastroenterology;  Laterality: N/A;  COLONOSCOPY WITH PROPOFOL N/A 09/26/2021   Procedure: COLONOSCOPY WITH PROPOFOL;  Surgeon: Tressia Danas, MD;  Location: [facility] ENDOSCOPY;  Service: Gastroenterology;  Laterality: N/A;   COLONOSCOPY WITH PROPOFOL N/A 09/29/2021   Procedure: COLONOSCOPY WITH PROPOFOL;  Surgeon: Tressia Danas, MD;  Location: [facility] ENDOSCOPY;  Service: Gastroenterology;  Laterality: N/A;   COLONOSCOPY WITH PROPOFOL N/A 10/01/2021   Procedure: COLONOSCOPY WITH PROPOFOL;  Surgeon: Napoleon Form, MD;  [facility];  Service: Gastroenterology;  Laterality: N/A;    ENDOSCOPIC MUCOSAL RESECTION N/A 01/09/2020   Procedure: ENDOSCOPIC MUCOSAL RESECTION;  Surgeon: Meridee Score Netty Starring., MD;  Location: [facility] ENDOSCOPY;  Service: Gastroenterology;  Laterality: N/A;   ENDOSCOPIC MUCOSAL RESECTION N/A 01/31/2021   Procedure: ENDOSCOPIC MUCOSAL RESECTION;  Surgeon: Meridee Score Netty Starring., MD;  Location[facility] ENDOSCOPY;  Service: Gastroenterology;  Laterality: N/A;   ENTEROSCOPY N/A 09/26/2021   Procedure: ENTEROSCOPY;  Surgeon: Tressia Danas, MD;  Location: [facility] ENDOSCOPY;  Service: Gastroenterology;  Laterality: N/A;   ESOPHAGOGASTRODUODENOSCOPY     ESOPHAGOGASTRODUODENOSCOPY (EGD) WITH PROPOFOL N/A 01/09/2020   Procedure: ESOPHAGOGASTRODUODENOSCOPY (EGD) WITH PROPOFOL;  Surgeon: Meridee Score Netty Starring., MD;  Location: [facility] ENDOSCOPY;  Service: Gastroenterology;  Laterality: N/A;   ESOPHAGOGASTRODUODENOSCOPY (EGD) WITH PROPOFOL N/A 01/13/2020   Procedure: ESOPHAGOGASTRODUODENOSCOPY (EGD) WITH PROPOFOL;  Surgeon: Hilarie Fredrickson, MD;  Location: [facility] ENDOSCOPY;  Service: Gastroenterology;  Laterality: N/A;   ESOPHAGOGASTRODUODENOSCOPY (EGD) WITH PROPOFOL N/A 01/31/2021   Procedure: ESOPHAGOGASTRODUODENOSCOPY (EGD) WITH PROPOFOL;  Surgeon: Meridee Score Netty Starring., MD;  Location: [facility] ENDOSCOPY;  Service: Gastroenterology;  Laterality: N/A;   EUS N/A 01/09/2020   Procedure: UPPER ENDOSCOPIC ULTRASOUND (EUS) RADIAL;  Surgeon: Lemar Lofty., MD;  Location: [facility] ENDOSCOPY;  Service: Gastroenterology;  Laterality: N/A;   FALSE ANEURYSM REPAIR Right 07/26/2022   Procedure: REPAIR RIGHT RADIO-CEPHALIC FISTULA ANEURYSM;  Surgeon: Victorino Sparrow, MD;  Location: [facility] OR;  Service: Vascular;  Laterality: Right;   GIVENS CAPSULE STUDY  09/29/2021   Procedure: GIVENS CAPSULE STUDY;  Surgeon: Tressia Danas, MD;  Location: [facility] ENDOSCOPY;  Service: Gastroenterology;;   HEMOSTASIS CLIP PLACEMENT  01/09/2020   Procedure: HEMOSTASIS CLIP PLACEMENT;  Surgeon: Lemar Lofty., MD;   Location: [facility] ENDOSCOPY;  Service: Gastroenterology;;   HEMOSTASIS CLIP PLACEMENT  01/31/2021   Procedure: HEMOSTASIS CLIP PLACEMENT;  Surgeon: Lemar Lofty., MD;  Location: [facility] ENDOSCOPY;  Service: Gastroenterology;;   HEMOSTASIS CONTROL  01/13/2020   Procedure: HEMOSTASIS CONTROL;  Surgeon: Hilarie Fredrickson, MD;  Location: [facility] ENDOSCOPY;  Service: Gastroenterology;;  hemaspray   HOT HEMOSTASIS N/A 01/13/2020   Procedure: HOT HEMOSTASIS (ARGON PLASMA COAGULATION/BICAP);  Surgeon: Hilarie Fredrickson, MD;  Location: [facility] ENDOSCOPY;  Service: Gastroenterology;  Laterality: N/A;   HOT HEMOSTASIS N/A 02/01/2021   Procedure: HOT HEMOSTASIS (ARGON PLASMA COAGULATION/BICAP);  Surgeon: Benancio Deeds, MD;  Location: [facility] ENDOSCOPY;  Service: Gastroenterology;  Laterality: N/A;   HOT HEMOSTASIS N/A 10/01/2021   Procedure: HOT HEMOSTASIS (ARGON PLASMA COAGULATION/BICAP);  Surgeon: Napoleon Form, MD;  Location: [facility] ENDOSCOPY;  Service: Gastroenterology;  Laterality: N/A;   INSERTION OF DIALYSIS CATHETER Left 07/26/2022   Procedure: INSERTION OF TUNNELED DIALYSIS CATHETER;  Surgeon: Victorino Sparrow, MD;  Location: [facility] OR;  Service: Vascular;  Laterality: Left;   IR GENERIC HISTORICAL  07/09/2016   IR US GUIDE VASC ACCESS RIGHT 07/09/2016 Oley Balm, MD [facility]   IR GENERIC HISTORICAL  07/09/2016   IR FLUORO GUIDE CV LINE RIGHT 07/09/2016 Oley Balm, MD [facility]   KNEE ARTHROPLASTY Left  LAPAROSCOPIC SIGMOID COLECTOMY N/A 07/11/2016   Procedure: LAPAROSCOPIC SIGMOID COLECTOMY;  Surgeon: Berna Bue, MD;  [facility] OR;  Service: General;  Laterality: N/A;   LEFT ATRIAL APPENDAGE OCCLUSION N/A 02/06/2022   Procedure: LEFT ATRIAL APPENDAGE OCCLUSION;  Surgeon: Tonny Bollman, MD;  Location: [facility] INVASIVE CV LAB;  Service: Cardiovascular;  Laterality: N/A;   LIGATION OF ARTERIOVENOUS  FISTULA Right 07/26/2022   Procedure: LIGATION OF RIGHT RADIO-CEPHALIC ARTERIOVENOUS  FISTULA;   Surgeon: Victorino Sparrow, MD;  Location: [facility] OR;  Service: Vascular;  Laterality: Right;   MASS EXCISION Right 04/10/2020   Procedure: EXCISION SKIN NODULE RIGHT FOREARM;  Surgeon: Maeola Harman, MD;  Location: [facility] OR;  Service: Vascular;  Laterality: Right;   POLYPECTOMY  09/26/2021   Procedure: POLYPECTOMY;  Surgeon: Tressia Danas, MD;  Location: [facility] ENDOSCOPY;  Service: Gastroenterology;;   POLYPECTOMY  10/01/2021   Procedure: POLYPECTOMY;  Surgeon: Napoleon Form, MD;  Location: [facility] ENDOSCOPY;  Service: Gastroenterology;;   REVISON OF ARTERIOVENOUS FISTULA Right 07/04/2022   Procedure: REVISON OF RIGHT FOREARM ARTERIOVENOUS FISTULA;  Surgeon: Leonie Douglas, MD;  Location: [facility] OR;  Service: Vascular;  Laterality: Right;   SCLEROTHERAPY  01/09/2020   Procedure: Susa Day;  Surgeon: Mansouraty, Netty Starring., MD;  Location: [facility] ENDOSCOPY;  Service: Gastroenterology;;   Susa Day  01/13/2020   Procedure: Susa Day;  Surgeon: Hilarie Fredrickson, MD;  Location: [facility] ENDOSCOPY;  Service: Gastroenterology;;   SUBMUCOSAL LIFTING INJECTION  01/09/2020   Procedure: SUBMUCOSAL LIFTING INJECTION;  Surgeon: Lemar Lofty., MD;  Location: [facility] ENDOSCOPY;  Service: Gastroenterology;;   TEE WITHOUT CARDIOVERSION N/A 02/06/2022   Procedure: TRANSESOPHAGEAL ECHOCARDIOGRAM (TEE);  Surgeon: Tonny Bollman, MD;  Location: [facility] INVASIVE CV LAB;  Service: Cardiovascular;  Laterality: N/A;   TUBAL LIGATION     UPPER EXTREMITY VENOGRAPHY N/A 10/27/2022   Procedure: UPPER EXTREMITY VENOGRAPHY;  Surgeon: Maeola Harman, MD;  Location: [facility] INVASIVE CV LAB;  Service: Cardiovascular;  Laterality: N/A;    Family History  Problem Relation Age of Onset   Heart failure Mother    Stroke Mother    Other Father    Colon cancer Neg Hx    Liver disease Neg Hx    Esophageal cancer Neg Hx    Stomach cancer Neg Hx    Inflammatory bowel disease Neg Hx    Rectal cancer Neg Hx    Pancreatic cancer  Neg Hx     Social History   Socioeconomic History   Marital status: Divorced    Spouse name: Not on file   Number of children: 4   Years of education: 14   Highest education level: Associate degree: occupational, Scientist, product/process development, or vocational program  Occupational History   Occupation: Retired  Tobacco Use   Smoking status: Never    Passive exposure: Never   Smokeless tobacco: Never  Vaping Use   Vaping Use: Never used  Substance and Sexual Activity   Alcohol use: No   Drug use: No   Sexual activity: Never    Birth control/protection: Post-menopausal  Other Topics Concern   Not on file  Social History Narrative   HH 1   Divorced   Outpatient dialysis Mon, Wed, Fri   4 children: 1 daughter locally is an Engineer, structural and 3 sons in [place]   Social Determinants of Health   Financial Resource Strain: High Risk (07/09/2022)   Overall Financial Resource Strain (CARDIA)    Difficulty of Paying Living Expenses: Hard  Food Insecurity:  Food Insecurity Present (08/21/2021)   Hunger Vital Sign    Worried About Running Out of Food in the Last Year: Often true    Ran Out of Food in the Last Year: Often true  Transportation Needs: Unmet Transportation Needs (08/21/2021)   PRAPARE - Administrator, Civil Service (Medical): Yes    Lack of Transportation (Non-Medical): No  Physical Activity: Inactive (08/06/2021)   Exercise Vital Sign    Days of Exercise per Week: 0 days    Minutes of Exercise per Session: 0 min  Stress: Stress Concern Present (08/21/2021)   [employer] of Occupational Health - Occupational Stress Questionnaire    Feeling of Stress : Very much  Social Connections: Moderately Isolated (07/31/2020)   Social Connection and Isolation Panel [NHANES]    Frequency of Communication with Friends and Family: More than three times a week    Frequency of Social Gatherings with Friends and Family: Never    Attends Religious Services: More than 4 times per year    Active  Member of [employer] or Organizations: No    Attends Banker Meetings: Never    Marital Status: Divorced  Catering manager Violence: Not At Risk (07/31/2020)   Humiliation, Afraid, Rape, and Kick questionnaire    Fear of Current or Ex-Partner: No    Emotionally Abused: No    Physically Abused: No    Sexually Abused: No    Allergies  Allergen Reactions   Latex Rash   Penicillins Other (See Comments)    Yeast infection / Childhood / Rash   Sulfa Antibiotics Rash   Tape Other (See Comments)    Plastic, silicone, and paper tape causes bruising and pulls off skin. Cloth tape works fine    Current Facility-Administered Medications  Medication Dose Route Frequency Provider Last Rate Last Admin   0.9 %  sodium chloride infusion  250 mL Intravenous PRN Maeola Harman, MD       acetaminophen (TYLENOL) tablet 650 mg  650 mg Oral Q6H PRN Leonie Douglas, MD       alum & mag hydroxide-simeth (MAALOX/MYLANTA) 200-200-20 MG/5ML suspension 15-30 mL  15-30 mL Oral Q2H PRN Leonie Douglas, MD       betamethasone acetate-betamethasone sodium phosphate (CELESTONE) injection 12 mg  12 mg Intramuscular Once Kirsteins, Victorino Sparrow, MD       guaiFENesin-dextromethorphan (ROBITUSSIN DM) 100-10 MG/5ML syrup 15 mL  15 mL Oral Q4H PRN Leonie Douglas, MD       heparin injection 5,000 Units  5,000 Units Subcutaneous Q8H Leonie Douglas, MD       hydrALAZINE (APRESOLINE) injection 5 mg  5 mg Intravenous Q20 Min PRN Leonie Douglas, MD       HYDROmorphone (DILAUDID) injection 0.5 mg  0.5 mg Intravenous Q4H PRN Leonie Douglas, MD       HYDROmorphone (DILAUDID) tablet 2 mg  2 mg Oral Q4H PRN Leonie Douglas, MD       labetalol (NORMODYNE) injection 10 mg  10 mg Intravenous Q10 min PRN Leonie Douglas, MD       lidocaine (XYLOCAINE) 1 % (with pres) injection 4 mL  4 mL Other Once Kirsteins, Victorino Sparrow, MD       metoprolol tartrate (LOPRESSOR) injection 2-5 mg  2-5 mg Intravenous Q2H PRN Leonie Douglas, MD       ondansetron [facility]) injection 4 mg  4 mg Intravenous Q6H PRN Leonie Douglas, MD  pantoprazole (PROTONIX) EC tablet 40 mg  40 mg Oral Daily Leonie Douglas, MD       phenol (CHLORASEPTIC) mouth spray 1 spray  1 spray Mouth/Throat PRN Leonie Douglas, MD       potassium chloride SA (KLOR-CON M) CR tablet 20-40 mEq  20-40 mEq Oral Once Leonie Douglas, MD       sodium chloride flush (NS) 0.9 % injection 3 mL  3 mL Intravenous Q12H Maeola Harman, MD       Current Outpatient Medications  Medication Sig Dispense Refill   atorvastatin (LIPITOR) 10 MG tablet Take 1 tablet (10 mg total) by mouth daily. 90 tablet 1   carvedilol (COREG) 12.5 MG tablet Take 1 tablet (12.5 mg total) by mouth 2 (two) times daily with a meal. 180 tablet 1   cinacalcet (SENSIPAR) 30 MG tablet Take 30 mg by mouth every Monday, Wednesday, and Friday with hemodialysis. Post dialysis     diltiazem (CARDIZEM) 60 MG tablet Take 1 tablet (60 mg total) by mouth 3 (three) times daily as needed (Atrial fibrillation). 30 tablet 1   famotidine (PEPCID) 40 MG tablet Take 1 tablet (40 mg total) by mouth 2 (two) times daily. 180 tablet 1   folic acid-vitamin b complex-vitamin c-selenium-zinc (DIALYVITE) 3 MG TABS tablet Take 1 tablet by mouth at bedtime.     hydrALAZINE (APRESOLINE) 25 MG tablet Take 12.5 mg by mouth in the morning and at bedtime.     lansoprazole (PREVACID) 30 MG capsule TAKE ONE CAPSULE BY MOUTH TWICE DAILY 60 capsule 2   LINZESS 72 MCG capsule TAKE ONE CAPSULE BY MOUTH BEFORE BREAKFAST (Patient taking differently: Take 72 mcg by mouth every other day.) 30 capsule 2   Methoxy PEG-Epoetin Beta (MIRCERA IJ) Inject as directed.     sevelamer carbonate (RENVELA) 800 MG tablet Take 1,600 mg by mouth 2 (two) times daily with a meal.     traMADol (ULTRAM) 50 MG tablet Take 1 tablet (50 mg total) by mouth 2 (two) times daily. 60 tablet 0    PHYSICAL EXAM Vitals:   10/30/22 1741 10/30/22  1900  BP: (!) 157/54 (!) 111/58  Pulse: 82 (!) 57  Resp: 18 15  Temp: 99 F (37.2 C)   TempSrc: Oral   SpO2: 98% 100%   Elderly woman in no acute distress Regular rate and rhythm Unlabored breathing Left antecubital fossa with roughly 4 cm hematoma with overlying ecchymosis.  A small incision is at the apex of the hematoma and appears closed.  There is some slight oozing from this.  I do not feel a thrill below the hematoma.  PERTINENT LABORATORY AND RADIOLOGIC DATA  Most recent CBC    Latest Ref Rng & Units 10/30/2022    6:14 PM 10/27/2022    8:08 AM 09/09/2022    7:35 AM  CBC  WBC 4.0 - 10.5 K/uL 5.3     Hemoglobin 12.0 - 15.0 g/dL 16.1  09.6  04.5   Hematocrit 36.0 - 46.0 % 34.7  32.0  41.0   Platelets 150 - 400 K/uL 205        Most recent CMP    Latest Ref Rng & Units 10/30/2022    6:14 PM 10/27/2022    8:08 AM 09/09/2022    7:35 AM  CMP  Glucose 70 - 99 mg/dL 409  96  97   BUN 8 - 23 mg/dL 37  54  30   Creatinine 0.44 - 1.00  mg/dL 1.63  84.53  6.46   Sodium 135 - 145 mmol/L 136  138  141   Potassium 3.5 - 5.1 mmol/L 4.1  4.5  4.1   Chloride 98 - 111 mmol/L 93  101  101   CO2 22 - 32 mmol/L 27     Calcium 8.9 - 10.3 mg/dL 9.4       Renal function Estimated Creatinine Clearance: 6.5 mL/min (A) (by C-G formula based on SCr of 7.71 mg/dL (H)).  No results found for: "HGBA1C"  LDL Cholesterol  Date Value Ref Range Status  10/06/2019 135 (H) 0 - 99 mg/dL Corrected    Comment:           Total Cholesterol/HDL:CHD Risk Coronary Heart Disease Risk Table                     Men   Women  1/2 Average Risk   3.4   3.3  Average Risk       5.0   4.4  2 X Average Risk   9.6   7.1  3 X Average Risk  23.4   11.0        Use the calculated Patient Ratio above and the CHD Risk Table to determine the patient's CHD Risk.        ATP III CLASSIFICATION (LDL):  <100     mg/dL   Optimal  803-212  mg/dL   Near or Above                    Optimal  130-159  mg/dL   Borderline   248-250  mg/dL   High  >037     mg/dL   Very High Performed at [facility], [address], [place], [place] [address] CORRECTED ON 03/18 AT 1725: PREVIOUSLY REPORTED AS NOT CALCULATED     Rande Brunt. Lenell Antu, MD FACS [facility] [place] [facility] Phone Number: [phone] 10/30/2022 7:40 PM   Total time spent on preparing this encounter including chart review, data review, collecting history, examining the patient, coordinating care for this established patient, 30 minutes.  Portions of this report may have been transcribed using voice recognition software.  Every effort has been made to ensure accuracy; however, inadvertent computerized transcription errors may still be present.

## 2022-10-30 NOTE — ED Provider Notes (Signed)
[place] [facility] [place] Provider Note  Medical Decision Making   HPI: Jane [name] is a 77 y.o. female with history perinent ESRD on IHD MWF via LUE AV fistula, chronic anticoagulation, depression, prior GI bleeds, A-fib who presents complaining of bleeding of left upper extremity.. Patient arrived via POV accompanied by daughter.  History provided by patient and daughter at bedside.  No interpreter required for this encounter.  Patient reports that she had a hematoma of her left upper extremity just alongside her fistula.  Reports that on 4/8 she underwent drainage with her vascular surgeon, Dr. Randie Heinzain.  Daughter reports that there was drainage of approximately 100 -150 cc of blood from the hematoma.  Patient reports that over the past several days she has had recurrent swelling at the site of the hematoma.  Reports that she went to dialysis on Tuesday (as a make-up day for Monday when she had her hematoma drained) as well as Wednesday and was able to use her fistula without complication.  Reports that she called her surgeon's office and reported that she was having recurrent swelling and they advised her to come to the ED, however she was unable to due to lack of time.  Reports that today she had more persistent venous oozing from the site of the incision, there show for she presents for further evaluation, endorses pain at the site, denies any weakness or paresthesias in her upper extremity  ROS: As per HPI. Please see MAR for complete past medical history, surgical history, and social history.   Physical exam is pertinent for focal hematoma just proximal to the antecubital fossa on the left upper extremity, incision in place with venous oozing.   The differential includes but is not limited to postoperative complication, hematoma, pseudoaneurysm, thrombosis, acute blood loss anemia.  Additional history obtained from: Record review External records from outside  source obtained and reviewed including: Reviewed procedure note from 4/8 where patient had hematoma incised and drained with approximately 100 cc of blood  ED provider interpretation of ECG: None indicated  ED provider interpretation of radiology/imaging: Not indicated  Labs ordered were interpreted by myself as well as my attending and were incorporated into the medical decision making process for this patient.  ED provider interpretation of labs: CBC without leukocytosis or thrombocytopenia.  Mild stable anemia.  BMP with electrolyte derangements consistent with known history of ESRD.  Interventions: None  See the EMR for full details regarding lab and imaging results.  Patient overall well-appearing on exam, intact neurovascular exam of bilateral upper extremities, patient does have venous oozing and swelling on left upper extremity just proximal to antecubital fossa concerning for recurrent hematoma.  Do feel that vascular surgery evaluation is indicated.  Consulted vascular surgery.  Dr. Lenell AntuHawken evaluated the patient at bedside, I personally discussed this patient with Dr. Lenell AntuHawken and he expressed concern for collection of lymphatic fluid and breakdown around the wound.  Feels that patient wants admission for operative exploration.  Patient admitted to the vascular surgery service.  Consults: Vascular surgery  Disposition: ADMIT: I believe the patient requires admission for further care and management. The patient was admitted to vascular surgery. Please see inpatient provider note for additional treatment plan details.   The plan for this patient was discussed with Dr. Freida BusmanAllen, who voiced agreement and who oversaw evaluation and treatment of this patient.  Clinical Impression:  1. Postoperative surgical complication involving circulatory system associated with circulatory procedure, unspecified complication  Admit  Therapies: These medications and interventions were provided for the  patient while in the ED. Medications  ondansetron (ZOFRAN) injection 4 mg (has no administration in time range)  alum & mag hydroxide-simeth (MAALOX/MYLANTA) 200-200-20 MG/5ML suspension 15-30 mL (has no administration in time range)  pantoprazole (PROTONIX) EC tablet 40 mg (40 mg Oral Given 10/30/22 2008)  labetalol (NORMODYNE) injection 10 mg (has no administration in time range)  hydrALAZINE (APRESOLINE) injection 5 mg (has no administration in time range)  metoprolol tartrate (LOPRESSOR) injection 2-5 mg (has no administration in time range)  guaiFENesin-dextromethorphan (ROBITUSSIN DM) 100-10 MG/5ML syrup 15 mL (has no administration in time range)  phenol (CHLORASEPTIC) mouth spray 1 spray (has no administration in time range)  heparin injection 5,000 Units (has no administration in time range)  acetaminophen (TYLENOL) tablet 650 mg (has no administration in time range)  HYDROmorphone (DILAUDID) injection 0.5 mg (has no administration in time range)  HYDROmorphone (DILAUDID) tablet 2 mg (has no administration in time range)  potassium chloride SA (KLOR-CON M) CR tablet 20-40 mEq (20 mEq Oral Given 10/30/22 2008)    MDM generated using voice dictation software and may contain dictation errors.  Please contact me for any clarification or with any questions.  Clinical Complexity A medically appropriate history, review of systems, and physical exam was performed.  Collateral history obtained from: Daughter at bedside, chart review I personally reviewed the labs, EKG, imaging as discussed above. Patient's presentation is most consistent with acute complicated illness / injury requiring diagnostic workup Considered and ruled out life and body threatening conditions  Treatment: Hospitalization Medications: None Discussed patient's care with providers from the following different specialties: Vascular surgery    Physical Exam   ED Triage Vitals  Enc Vitals Group     BP 10/30/22 1741 (!)  157/54     Pulse Rate 10/30/22 1741 82     Resp 10/30/22 1741 18     Temp 10/30/22 1741 99 F (37.2 C)     Temp Source 10/30/22 1741 Oral     SpO2 10/30/22 1741 98 %     Weight --      Height --      Head Circumference --      Peak Flow --      Pain Score 10/30/22 1759 6     Pain Loc --      Pain Edu? --      Excl. in GC? --      Physical Exam Vitals and nursing note reviewed.  Constitutional:      General: She is not in acute distress.    Appearance: She is well-developed.  HENT:     Head: Normocephalic and atraumatic.  Eyes:     Conjunctiva/sclera: Conjunctivae normal.  Cardiovascular:     Rate and Rhythm: Normal rate and regular rhythm.     Heart sounds: No murmur heard.    Comments: Bradycardia from AV fistula radiating to chest and able to auscultate on cardiopulmonary exam Pulmonary:     Effort: Pulmonary effort is normal. No respiratory distress.     Breath sounds: Normal breath sounds.  Abdominal:     Palpations: Abdomen is soft.     Tenderness: There is no abdominal tenderness.  Musculoskeletal:        General: Swelling present.     Cervical back: Neck supple.     Comments: Left upper arm with mass just proximal to the antecubital fossa, overlying ecchymosis, approximately 1.5 cm incision oozing venous  blood, no arterial pulsatility  Skin:    General: Skin is warm and dry.     Capillary Refill: Capillary refill takes less than 2 seconds.  Neurological:     Mental Status: She is alert.  Psychiatric:        Mood and Affect: Mood normal.       Procedure Note  Procedures  No orders to display    Julianne Rice, MD Emergency Medicine, PGY-2     Curley Spice, MD 10/30/22 2038    Lorre Nick, MD 11/02/22 1524

## 2022-10-30 NOTE — ED Provider Triage Note (Signed)
Emergency Medicine Provider Triage Evaluation Note  Jane [name] , a 77 y.o. female  was evaluated in triage.  Pt complains of bleeding hematoma.  Pt had L brachiobasilic AV fistula creation done 6 weeks ago that was thrombosed.  Had venogram done by Dr. Randie Heinz 3 days ago.  Started to bleed today.  Last dialysis was yesterday.  No significant pain.    Review of Systems  Positive: As aboe Negative: As above  Physical Exam  BP (!) 157/54   Pulse 82   Temp 99 F (37.2 C) (Oral)   Resp 18   SpO2 98%  Gen:   Awake, no distress   Resp:  Normal effort  MSK:   Moves extremities without difficulty  Other:    Medical Decision Making  Medically screening exam initiated at 5:57 PM.  Appropriate orders placed.  Jane [name] was informed that the remainder of the evaluation will be completed by another provider, this initial triage assessment does not replace that evaluation, and the importance of remaining in the ED until their evaluation is complete.     Fayrene Helper, PA-C 10/30/22 1759

## 2022-10-30 NOTE — ED Triage Notes (Signed)
L AV fistula placed 6 weeks ago, thrombosed; attempted to clean up Monday ; hematoma developed after;  dialysis Tuesday, Wednesday this week; hematoma increased in size, started oozing blood today; last full dialysis session yesterday; endorses pain at site going down to hand

## 2022-10-30 NOTE — Plan of Care (Signed)
  Problem: Clinical Measurements: Goal: Will remain free from infection Outcome: Progressing Goal: Cardiovascular complication will be avoided Outcome: Progressing   Problem: Activity: Goal: Risk for activity intolerance will decrease Outcome: Progressing   Problem: Coping: Goal: Level of anxiety will decrease Outcome: Progressing   Problem: Pain Managment: Goal: General experience of comfort will improve Outcome: Progressing   Problem: Safety: Goal: Ability to remain free from injury will improve Outcome: Progressing   

## 2022-10-31 ENCOUNTER — Encounter (HOSPITAL_COMMUNITY)
Admission: EM | Disposition: A | Discharge status: Home / Self Care | Payer: Self-pay | Source: Home / Self Care | Attending: Vascular Surgery

## 2022-10-31 ENCOUNTER — Observation Stay (HOSPITAL_COMMUNITY): Payer: Medicare Other | Admitting: Anesthesiology

## 2022-10-31 ENCOUNTER — Encounter (HOSPITAL_COMMUNITY): Payer: Self-pay | Admitting: Vascular Surgery

## 2022-10-31 ENCOUNTER — Other Ambulatory Visit: Payer: Self-pay

## 2022-10-31 ENCOUNTER — Observation Stay (HOSPITAL_BASED_OUTPATIENT_CLINIC_OR_DEPARTMENT_OTHER): Payer: Medicare Other | Admitting: Anesthesiology

## 2022-10-31 DIAGNOSIS — L7632 Postprocedural hematoma of skin and subcutaneous tissue following other procedure: Secondary | ICD-10-CM

## 2022-10-31 DIAGNOSIS — N2581 Secondary hyperparathyroidism of renal origin: Secondary | ICD-10-CM | POA: Diagnosis present

## 2022-10-31 DIAGNOSIS — I9789 Other postprocedural complications and disorders of the circulatory system, not elsewhere classified: Secondary | ICD-10-CM | POA: Diagnosis not present

## 2022-10-31 DIAGNOSIS — E8770 Fluid overload, unspecified: Secondary | ICD-10-CM | POA: Diagnosis present

## 2022-10-31 DIAGNOSIS — Z96652 Presence of left artificial knee joint: Secondary | ICD-10-CM | POA: Diagnosis present

## 2022-10-31 DIAGNOSIS — D631 Anemia in chronic kidney disease: Secondary | ICD-10-CM

## 2022-10-31 DIAGNOSIS — N186 End stage renal disease: Secondary | ICD-10-CM

## 2022-10-31 DIAGNOSIS — M7981 Nontraumatic hematoma of soft tissue: Secondary | ICD-10-CM | POA: Diagnosis present

## 2022-10-31 DIAGNOSIS — Z95818 Presence of other cardiac implants and grafts: Secondary | ICD-10-CM | POA: Diagnosis not present

## 2022-10-31 DIAGNOSIS — T148XXA Other injury of unspecified body region, initial encounter: Secondary | ICD-10-CM | POA: Diagnosis not present

## 2022-10-31 DIAGNOSIS — Z882 Allergy status to sulfonamides status: Secondary | ICD-10-CM | POA: Diagnosis not present

## 2022-10-31 DIAGNOSIS — Z8249 Family history of ischemic heart disease and other diseases of the circulatory system: Secondary | ICD-10-CM | POA: Diagnosis not present

## 2022-10-31 DIAGNOSIS — T82838A Hemorrhage of vascular prosthetic devices, implants and grafts, initial encounter: Secondary | ICD-10-CM | POA: Diagnosis present

## 2022-10-31 DIAGNOSIS — Z7901 Long term (current) use of anticoagulants: Secondary | ICD-10-CM | POA: Diagnosis not present

## 2022-10-31 DIAGNOSIS — I480 Paroxysmal atrial fibrillation: Secondary | ICD-10-CM | POA: Diagnosis present

## 2022-10-31 DIAGNOSIS — M7989 Other specified soft tissue disorders: Secondary | ICD-10-CM | POA: Diagnosis present

## 2022-10-31 DIAGNOSIS — Z5941 Food insecurity: Secondary | ICD-10-CM | POA: Diagnosis not present

## 2022-10-31 DIAGNOSIS — Z91040 Latex allergy status: Secondary | ICD-10-CM | POA: Diagnosis not present

## 2022-10-31 DIAGNOSIS — K219 Gastro-esophageal reflux disease without esophagitis: Secondary | ICD-10-CM | POA: Diagnosis present

## 2022-10-31 DIAGNOSIS — Z992 Dependence on renal dialysis: Secondary | ICD-10-CM

## 2022-10-31 DIAGNOSIS — N250 Renal osteodystrophy: Secondary | ICD-10-CM | POA: Diagnosis not present

## 2022-10-31 DIAGNOSIS — J45909 Unspecified asthma, uncomplicated: Secondary | ICD-10-CM | POA: Diagnosis present

## 2022-10-31 DIAGNOSIS — Z823 Family history of stroke: Secondary | ICD-10-CM | POA: Diagnosis not present

## 2022-10-31 DIAGNOSIS — Z5982 Transportation insecurity: Secondary | ICD-10-CM | POA: Diagnosis not present

## 2022-10-31 DIAGNOSIS — Y832 Surgical operation with anastomosis, bypass or graft as the cause of abnormal reaction of the patient, or of later complication, without mention of misadventure at the time of the procedure: Secondary | ICD-10-CM | POA: Diagnosis present

## 2022-10-31 DIAGNOSIS — I120 Hypertensive chronic kidney disease with stage 5 chronic kidney disease or end stage renal disease: Secondary | ICD-10-CM | POA: Diagnosis not present

## 2022-10-31 DIAGNOSIS — Z5986 Financial insecurity: Secondary | ICD-10-CM | POA: Diagnosis not present

## 2022-10-31 DIAGNOSIS — Z79899 Other long term (current) drug therapy: Secondary | ICD-10-CM | POA: Diagnosis not present

## 2022-10-31 HISTORY — PX: I & D EXTREMITY: SHX5045

## 2022-10-31 LAB — GLUCOSE, CAPILLARY
Glucose-Capillary: 174 mg/dL — ABNORMAL HIGH (ref 70–99)
Glucose-Capillary: 89 mg/dL (ref 70–99)
Glucose-Capillary: 136 mg/dL — ABNORMAL HIGH (ref 70–99)
Glucose-Capillary: 130 mg/dL — ABNORMAL HIGH (ref 70–99)

## 2022-10-31 LAB — POCT I-STAT, CHEM 8
BUN: 56 mg/dL — ABNORMAL HIGH (ref 8–23)
Calcium, Ion: 0.98 mmol/L — ABNORMAL LOW (ref 1.15–1.40)
Chloride: 98 mmol/L (ref 98–111)
Creatinine, Ser: 10.5 mg/dL — ABNORMAL HIGH (ref 0.44–1.00)
Glucose, Bld: 88 mg/dL (ref 70–99)
HCT: 31 % — ABNORMAL LOW (ref 36.0–46.0)
Hemoglobin: 10.5 g/dL — ABNORMAL LOW (ref 12.0–15.0)
Potassium: 5.2 mmol/L — ABNORMAL HIGH (ref 3.5–5.1)
Sodium: 135 mmol/L (ref 135–145)
TCO2: 30 mmol/L (ref 22–32)

## 2022-10-31 LAB — SURGICAL PCR SCREEN
MRSA, PCR: NEGATIVE
Staphylococcus aureus: NEGATIVE

## 2022-10-31 SURGERY — IRRIGATION AND DEBRIDEMENT EXTREMITY
Anesthesia: General | Site: Arm Upper | Laterality: Left

## 2022-10-31 MED ORDER — EPHEDRINE SULFATE-NACL 50-0.9 MG/10ML-% IV SOSY
PREFILLED_SYRINGE | INTRAVENOUS | Status: DC | PRN
  Administered 2022-10-31: 10 mg via INTRAVENOUS
  Administered 2022-10-31: 6 mg via INTRAVENOUS

## 2022-10-31 MED ORDER — HEMOSTATIC AGENTS (NO CHARGE) OPTIME
TOPICAL | Status: DC | PRN
  Administered 2022-10-31: 1 via TOPICAL

## 2022-10-31 MED ORDER — CHLORHEXIDINE GLUCONATE 0.12 % MT SOLN
15.0000 mL | Freq: Once | OROMUCOSAL | Status: AC
  Administered 2022-10-31: 15 mL via OROMUCOSAL

## 2022-10-31 MED ORDER — 0.9 % SODIUM CHLORIDE (POUR BTL) OPTIME
TOPICAL | Status: DC | PRN
  Administered 2022-10-31: 1000 mL

## 2022-10-31 MED ORDER — ONDANSETRON HCL 4 MG/2ML IJ SOLN: 4.0000 mg | Freq: Once | INTRAMUSCULAR | Status: DC | PRN

## 2022-10-31 MED ORDER — DEXAMETHASONE SODIUM PHOSPHATE 10 MG/ML IJ SOLN
INTRAMUSCULAR | Status: AC
  Filled 2022-10-31: qty 1

## 2022-10-31 MED ORDER — FENTANYL CITRATE (PF) 100 MCG/2ML IJ SOLN
25.0000 ug | INTRAMUSCULAR | Status: DC | PRN
  Administered 2022-10-31: 25 ug via INTRAVENOUS

## 2022-10-31 MED ORDER — ACETAMINOPHEN 10 MG/ML IV SOLN
1000.0000 mg | Freq: Once | INTRAVENOUS | Status: DC | PRN
  Administered 2022-10-31: 1000 mg via INTRAVENOUS

## 2022-10-31 MED ORDER — FENTANYL CITRATE (PF) 250 MCG/5ML IJ SOLN
INTRAMUSCULAR | Status: AC
  Filled 2022-10-31: qty 5

## 2022-10-31 MED ORDER — EPHEDRINE 5 MG/ML INJ
INTRAVENOUS | Status: AC
  Filled 2022-10-31: qty 10

## 2022-10-31 MED ORDER — PROPOFOL 10 MG/ML IV BOLUS
INTRAVENOUS | Status: DC | PRN
  Administered 2022-10-31: 150 mg via INTRAVENOUS

## 2022-10-31 MED ORDER — ACETAMINOPHEN 10 MG/ML IV SOLN
INTRAVENOUS | Status: AC
  Filled 2022-10-31: qty 100

## 2022-10-31 MED ORDER — CISATRACURIUM BESYLATE (PF) 10 MG/5ML IV SOLN
INTRAVENOUS | Status: DC | PRN
  Administered 2022-10-31: 16 mg via INTRAVENOUS

## 2022-10-31 MED ORDER — FENTANYL CITRATE (PF) 250 MCG/5ML IJ SOLN
INTRAMUSCULAR | Status: DC | PRN
  Administered 2022-10-31: 100 ug via INTRAVENOUS
  Administered 2022-10-31 (×2): 50 ug via INTRAVENOUS

## 2022-10-31 MED ORDER — LIDOCAINE 2% (20 MG/ML) 5 ML SYRINGE
INTRAMUSCULAR | Status: DC | PRN
  Administered 2022-10-31: 60 mg via INTRAVENOUS

## 2022-10-31 MED ORDER — DEXAMETHASONE SODIUM PHOSPHATE 10 MG/ML IJ SOLN
INTRAMUSCULAR | Status: DC | PRN
  Administered 2022-10-31: 4 mg via INTRAVENOUS

## 2022-10-31 MED ORDER — GLYCOPYRROLATE PF 0.2 MG/ML IJ SOSY
PREFILLED_SYRINGE | INTRAMUSCULAR | Status: DC | PRN
  Administered 2022-10-31: .2 mg via INTRAVENOUS

## 2022-10-31 MED ORDER — PHENYLEPHRINE 80 MCG/ML (10ML) SYRINGE FOR IV PUSH (FOR BLOOD PRESSURE SUPPORT)
PREFILLED_SYRINGE | INTRAVENOUS | Status: AC
  Filled 2022-10-31: qty 20

## 2022-10-31 MED ORDER — ORAL CARE MOUTH RINSE: 15.0000 mL | Freq: Once | OROMUCOSAL | Status: AC

## 2022-10-31 MED ORDER — ONDANSETRON HCL 4 MG/2ML IJ SOLN
INTRAMUSCULAR | Status: DC | PRN
  Administered 2022-10-31: 4 mg via INTRAVENOUS

## 2022-10-31 MED ORDER — CISATRACURIUM BESYLATE 20 MG/10ML IV SOLN
INTRAVENOUS | Status: AC
  Filled 2022-10-31: qty 10

## 2022-10-31 MED ORDER — SODIUM CHLORIDE 0.9 % IV SOLN: INTRAVENOUS | Status: DC

## 2022-10-31 MED ORDER — CEFAZOLIN SODIUM-DEXTROSE 2-3 GM-%(50ML) IV SOLR
INTRAVENOUS | Status: DC | PRN
  Administered 2022-10-31: 2 g via INTRAVENOUS

## 2022-10-31 MED ORDER — PHENYLEPHRINE 80 MCG/ML (10ML) SYRINGE FOR IV PUSH (FOR BLOOD PRESSURE SUPPORT)
PREFILLED_SYRINGE | INTRAVENOUS | Status: DC | PRN
  Administered 2022-10-31: 160 ug via INTRAVENOUS

## 2022-10-31 MED ORDER — FENTANYL CITRATE (PF) 100 MCG/2ML IJ SOLN
INTRAMUSCULAR | Status: AC
  Filled 2022-10-31: qty 2

## 2022-10-31 MED ORDER — PHENYLEPHRINE HCL-NACL 20-0.9 MG/250ML-% IV SOLN
INTRAVENOUS | Status: DC | PRN
  Administered 2022-10-31: 50 ug/min via INTRAVENOUS

## 2022-10-31 MED ORDER — PROPOFOL 1000 MG/100ML IV EMUL
INTRAVENOUS | Status: AC
  Filled 2022-10-31: qty 100

## 2022-10-31 SURGICAL SUPPLY — 27 items
BIOPATCH RED 1 DISK 7.0 (GAUZE/BANDAGES/DRESSINGS) IMPLANT
BNDG ELASTIC 4X5.8 VLCR STR LF (GAUZE/BANDAGES/DRESSINGS) IMPLANT
CANISTER SUCT 3000ML PPV (MISCELLANEOUS) ×1 IMPLANT
COVER SURGICAL LIGHT HANDLE (MISCELLANEOUS) ×2 IMPLANT
DRAIN JP 10F RND RADIO (DRAIN) IMPLANT
DRAPE ORTHO SPLIT 77X108 STRL (DRAPES) ×1
DRAPE SURG ORHT 6 SPLT 77X108 (DRAPES) IMPLANT
ELECT REM PT RETURN 9FT ADLT (ELECTROSURGICAL) ×1
ELECTRODE REM PT RTRN 9FT ADLT (ELECTROSURGICAL) ×1 IMPLANT
EVACUATOR SILICONE 100CC (DRAIN) IMPLANT
GAUZE SPONGE 4X4 12PLY STRL (GAUZE/BANDAGES/DRESSINGS) ×1 IMPLANT
GLOVE BIO SURGEON STRL SZ7.5 (GLOVE) ×1 IMPLANT
GLOVE BIOGEL PI IND STRL 8 (GLOVE) ×1 IMPLANT
GOWN STRL REUS W/ TWL LRG LVL3 (GOWN DISPOSABLE) ×2 IMPLANT
GOWN STRL REUS W/ TWL XL LVL3 (GOWN DISPOSABLE) ×2 IMPLANT
GOWN STRL REUS W/TWL LRG LVL3 (GOWN DISPOSABLE) ×2
GOWN STRL REUS W/TWL XL LVL3 (GOWN DISPOSABLE) ×2
KIT BASIN OR (CUSTOM PROCEDURE TRAY) ×1 IMPLANT
KIT TURNOVER KIT B (KITS) ×1 IMPLANT
NS IRRIG 1000ML POUR BTL (IV SOLUTION) ×1 IMPLANT
PACK GENERAL/GYN (CUSTOM PROCEDURE TRAY) IMPLANT
PAD ARMBOARD 7.5X6 YLW CONV (MISCELLANEOUS) ×2 IMPLANT
POWDER SURGICEL 3.0 GRAM (HEMOSTASIS) IMPLANT
SUT ETHILON 3 0 PS 1 (SUTURE) IMPLANT
SUT MNCRL AB 4-0 PS2 18 (SUTURE) IMPLANT
TOWEL GREEN STERILE (TOWEL DISPOSABLE) ×1 IMPLANT
WATER STERILE IRR 1000ML POUR (IV SOLUTION) ×1 IMPLANT

## 2022-10-31 NOTE — Transfer of Care (Addendum)
Immediate Anesthesia Transfer of Care Note  Patient: Jane [name]  Procedure(s) Performed: IRRIGATION AND DEBRIDEMENT AND PLACEMENT OF DRAIN LEFT ARM (Left: Arm Upper)  Patient Location: PACU  Anesthesia Type:General  Level of Consciousness: drowsy and patient cooperative  Airway & Oxygen Therapy: Patient Spontanous Breathing  Post-op Assessment: Report given to RN and Post -op Vital signs reviewed and stable  Post vital signs: Reviewed and stable  Last Vitals:  Vitals Value Taken Time  BP 147/55 10/31/22 1053  Temp    Pulse 91 10/31/22 1055  Resp 14 10/31/22 1055  SpO2 99 % 10/31/22 1055  Vitals shown include unvalidated device data.  Last Pain:  Vitals:   10/31/22 0805  TempSrc:   PainSc: 7       Patients Stated Pain Goal: 0 (10/31/22 0805)  Complications: No notable events documented.

## 2022-10-31 NOTE — Plan of Care (Signed)

## 2022-10-31 NOTE — Anesthesia Preprocedure Evaluation (Addendum)
Anesthesia Evaluation  Patient identified by MRN, date of birth, ID band Patient awake    Reviewed: Allergy & Precautions, NPO status , Patient's Chart, lab work & pertinent test results  Airway Mallampati: III  TM Distance: >3 FB Neck ROM: Full    Dental no notable dental hx.    Pulmonary asthma    Pulmonary exam normal        Cardiovascular hypertension, Pt. on home beta blockers and Pt. on medications Normal cardiovascular exam+ dysrhythmias      Neuro/Psych  PSYCHIATRIC DISORDERS  Depression    TIA   GI/Hepatic Neg liver ROS, PUD,GERD  Medicated,,  Endo/Other  negative endocrine ROS    Renal/GU ESRF and DialysisRenal diseaseOn HD M,W,F     Musculoskeletal  (+) Arthritis ,    Abdominal  (+) + obese  Peds  Hematology  (+) Blood dyscrasia, anemia   Anesthesia Other Findings LEFT ARM HEMATOMA  Reproductive/Obstetrics                             Anesthesia Physical Anesthesia Plan  ASA: 3  Anesthesia Plan: General   Post-op Pain Management:    Induction: Intravenous  PONV Risk Score and Plan: 3 and Ondansetron, Dexamethasone and Treatment may vary due to age or medical condition  Airway Management Planned: Oral ETT  Additional Equipment:   Intra-op Plan:   Post-operative Plan: Extubation in OR  Informed Consent: I have reviewed the patients History and Physical, chart, labs and discussed the procedure including the risks, benefits and alternatives for the proposed anesthesia with the patient or authorized representative who has indicated his/her understanding and acceptance.     Dental advisory given  Plan Discussed with: CRNA  Anesthesia Plan Comments:         Anesthesia Quick Evaluation

## 2022-10-31 NOTE — Progress Notes (Signed)
[facility] [place]  Subjective  - notable left arm swelling at AVF site.   Objective (!) 120/43 69 98.1 F (36.7 C) (Oral) 20 95%  Intake/Output Summary (Last 24 hours) at 10/31/2022 0901 Last data filed at 10/30/2022 2300 Gross per 24 hour  Intake 120 ml  Output --  Net 120 ml    Left arm avf with notable hematoma  Laboratory Lab Results: Recent Labs    10/30/22 1814 10/30/22 1955 10/31/22 0814  WBC 5.3 5.4  --   HGB 11.0* 10.2* 10.5*  HCT 34.7* 32.5* 31.0*  PLT 205 201  --    BMET Recent Labs    10/30/22 1814 10/30/22 1955 10/31/22 0814  NA 136 136 135  K 4.1 4.2 5.2*  CL 93* 95* 98  CO2 27 30  --   GLUCOSE 131* 121* 88  BUN 37* 39* 56*  CREATININE 7.71* 8.10* 10.50*  CALCIUM 9.4 9.1  --     COAG Lab Results  Component Value Date   INR 1.3 (H) 10/30/2022   INR 1.2 07/04/2022   INR 2.9 03/06/2022   No results found for: "PTT"  Assessment/Planning:  Plan I&D left arm hematoma at AVF incision site.  Noted to be occluded with no thrill and has catheter.  Discussed may place drain.  Cephus Shelling 10/31/2022 9:01 AM --

## 2022-10-31 NOTE — Anesthesia Postprocedure Evaluation (Signed)
Anesthesia Post Note  Patient: Jane [name]  Procedure(s) Performed: IRRIGATION AND DEBRIDEMENT AND PLACEMENT OF DRAIN LEFT ARM (Left: Arm Upper)     Patient location during evaluation: PACU Anesthesia Type: General Level of consciousness: awake Pain management: pain level controlled Vital Signs Assessment: post-procedure vital signs reviewed and stable Respiratory status: spontaneous breathing, nonlabored ventilation and respiratory function stable Cardiovascular status: blood pressure returned to baseline and stable Postop Assessment: no apparent nausea or vomiting Anesthetic complications: no   No notable events documented.  Last Vitals:  Vitals:   10/31/22 1115 10/31/22 1130  BP: (!) 138/57 (!) 148/48  Pulse: 90 92  Resp: 17 18  Temp:  36.7 C  SpO2: 98% 93%    Last Pain:  Vitals:   10/31/22 1130  TempSrc:   PainSc: Asleep                 Kryssa Risenhoover P Valerie Fredin

## 2022-10-31 NOTE — Op Note (Signed)
Date: October 31, 2022  Preoperative diagnosis: Left arm hematoma at previous AV fistula site  Postoperative diagnosis: Same  Procedure: 1.  I&D of left arm hematoma with evacuation of hematoma 2.  Placement of 10 French Blake drain left arm hematoma  Surgeon: Dr. Cephus Shelling, MD  Assistant: OR staff  Indications: Patient previously underwent a left arm brachiobasilic fistula on 09/09/2022 by Dr. Randie Heinz.  She developed hematoma and underwent I&D of this earlier this week.  She then presented back to the hospital with recurrent hematoma after I&D.  She presents for washout after risk benefits discussed.  Findings: Large hematoma was evacuated.  There was no obvious source of bleeding just raw surface oozing.  I did place a 10 [place] round Blake drain with hemostatic powder using Surgicel powder and the wound was closed.  The fistula is known to be thrombosed.  Details: Patient was taken to the operating room after informed consent was obtained.  Placed on operative table supine position.  After anesthesia was induced the left arm was prepped and draped in standard sterile fashion.  Antibiotics were given and timeout performed.  I used a 10 blade and reopened the previous incision at the antecubitum.  We encountered a large hematoma cavity that was evacuated.  I got all the old clot out.  This was copiously irrigated.  I then got hemostasis with Bovie cautery.  I explored the wound and did not see any obvious source of bleeding.  I did tunnel out a 10 [place] round Blake drain through the subcutaneous tissue and this was secured with a 3-0 nylon.  I put Surgicel powder in the wound.  The wound was then closed with 3-0 Vicryl 4-0 Monocryl and Dermabond.  Gentle ace was applied.  .  Complication: None  Condition: Stable  Cephus Shelling, MD [facility] [place] Office: [phone]   Cephus Shelling

## 2022-10-31 NOTE — TOC Progression Note (Addendum)
Transition of Care [facility]) - Progression Note    Patient Details  Name: Jane [name] MRN: [id number] Date of Birth: Dec 06, 1945  Transition of Care [facility]) CM/SW Contact  Leone Haven, RN Phone Number: 10/31/2022, 1:46 PM  Clinical Narrative:     Transition of Care [facility]) Screening Note   Patient Details  Name: Jane [name] Date of Birth: 1946/04/09   Transition of Care [facility]) CM/SW Contact:    Leone Haven, RN Phone Number: 10/31/2022, 1:47 PM    Transition of Care [facility]) has reviewed patient and no TOC needs have been identified at this time. We will continue to monitor patient advancement through interdisciplinary progression rounds. If new patient transition needs arise, please place a TOC consult. Patient for I and D surgery today.          Expected Discharge Plan and Services                                               Social Determinants of Health (SDOH) Interventions SDOH Screenings   Food Insecurity: Food Insecurity Present (10/30/2022)  Housing: Low Risk  (10/30/2022)  Transportation Needs: No Transportation Needs (10/30/2022)  Utilities: Not At Risk (10/30/2022)  Depression (PHQ2-9): Low Risk  (10/16/2022)  Recent Concern: Depression (PHQ2-9) - Medium Risk (08/21/2022)  Financial Resource Strain: High Risk (07/09/2022)  Physical Activity: Inactive (08/06/2021)  Social Connections: Moderately Isolated (07/31/2020)  Stress: Stress Concern Present (08/21/2021)  Tobacco Use: Low Risk  (10/31/2022)    Readmission Risk Interventions     No data to display

## 2022-10-31 NOTE — Anesthesia Procedure Notes (Signed)
Procedure Name: Intubation Date/Time: 10/31/2022 9:43 AM  Performed by: Alvera Novel, CRNAPre-anesthesia Checklist: Patient identified, Emergency Drugs available, Suction available and Patient being monitored Patient Re-evaluated:Patient Re-evaluated prior to induction Oxygen Delivery Method: Circle System Utilized Preoxygenation: Pre-oxygenation with 100% oxygen Induction Type: IV induction Ventilation: Oral airway inserted - appropriate to patient size Laryngoscope Size: Mac and 3 Grade View: Grade I Tube type: Oral Tube size: 7.0 mm Number of attempts: 2 Airway Equipment and Method: Stylet and Oral airway Placement Confirmation: ETT inserted through vocal cords under direct vision, positive ETCO2 and breath sounds checked- equal and bilateral Secured at: 22 cm Tube secured with: Tape Dental Injury: Teeth and Oropharynx as per pre-operative assessment  Comments: Abby, EMT student placed airway. 2nd attempt with guidance from CRNA getting view. Grade 1 view. Atraumatic intubation.

## 2022-11-01 LAB — RENAL FUNCTION PANEL
Albumin: 2.9 g/dL — ABNORMAL LOW (ref 3.5–5.0)
Anion gap: 15 (ref 5–15)
BUN: 64 mg/dL — ABNORMAL HIGH (ref 8–23)
CO2: 26 mmol/L (ref 22–32)
Calcium: 8.6 mg/dL — ABNORMAL LOW (ref 8.9–10.3)
Chloride: 93 mmol/L — ABNORMAL LOW (ref 98–111)
Creatinine, Ser: 11.81 mg/dL — ABNORMAL HIGH (ref 0.44–1.00)
GFR, Estimated: 3 mL/min — ABNORMAL LOW (ref >60)
Glucose, Bld: 113 mg/dL — ABNORMAL HIGH (ref 70–99)
Phosphorus: 7.1 mg/dL — ABNORMAL HIGH (ref 2.5–4.6)
Potassium: 4.7 mmol/L (ref 3.5–5.1)
Sodium: 134 mmol/L — ABNORMAL LOW (ref 135–145)

## 2022-11-01 LAB — CBC
HCT: 29.5 % — ABNORMAL LOW (ref 36.0–46.0)
Hemoglobin: 9.2 g/dL — ABNORMAL LOW (ref 12.0–15.0)
MCH: 28.7 pg (ref 26.0–34.0)
MCHC: 31.2 g/dL (ref 30.0–36.0)
MCV: 91.9 fL (ref 80.0–100.0)
Platelets: 200 10*3/uL (ref 150–400)
RBC: 3.21 MIL/uL — ABNORMAL LOW (ref 3.87–5.11)
RDW: 16.4 % — ABNORMAL HIGH (ref 11.5–15.5)
WBC: 7.5 10*3/uL (ref 4.0–10.5)
nRBC: 0 % (ref 0.0–0.2)

## 2022-11-01 LAB — GLUCOSE, CAPILLARY: Glucose-Capillary: 111 mg/dL — ABNORMAL HIGH (ref 70–99)

## 2022-11-01 LAB — HEPATITIS B SURFACE ANTIGEN: Hepatitis B Surface Ag: NONREACTIVE

## 2022-11-01 MED ORDER — LIDOCAINE HCL (PF) 1 % IJ SOLN: 5.0000 mL | INTRAMUSCULAR | Status: DC | PRN

## 2022-11-01 MED ORDER — OXYCODONE HCL 5 MG PO TABS: 5.0000 mg | ORAL_TABLET | Freq: Four times a day (QID) | ORAL | 0 refills | Status: DC | PRN

## 2022-11-01 MED ORDER — CHLORHEXIDINE GLUCONATE CLOTH 2 % EX PADS: 6.0000 | MEDICATED_PAD | Freq: Every day | CUTANEOUS | Status: DC

## 2022-11-01 MED ORDER — ANTICOAGULANT SODIUM CITRATE 4% (200MG/5ML) IV SOLN: 5.0000 mL | Status: DC | PRN

## 2022-11-01 MED ORDER — PENTAFLUOROPROP-TETRAFLUOROETH EX AERO: 1.0000 | INHALATION_SPRAY | CUTANEOUS | Status: DC | PRN

## 2022-11-01 MED ORDER — FAMOTIDINE 20 MG PO TABS
20.0000 mg | ORAL_TABLET | Freq: Every day | ORAL | 1 refills | Status: DC
Start: 2022-11-01 — End: 2023-01-13

## 2022-11-01 MED ORDER — LIDOCAINE-PRILOCAINE 2.5-2.5 % EX CREA: 1.0000 | TOPICAL_CREAM | CUTANEOUS | Status: DC | PRN

## 2022-11-01 MED ORDER — FAMOTIDINE 20 MG PO TABS
20.0000 mg | ORAL_TABLET | Freq: Every day | ORAL | Status: DC
  Administered 2022-11-01: 20 mg via ORAL
  Filled 2022-11-01: qty 1

## 2022-11-01 MED ORDER — ALTEPLASE 2 MG IJ SOLR: 2.0000 mg | Freq: Once | INTRAMUSCULAR | Status: DC | PRN

## 2022-11-01 MED ORDER — HEPARIN SODIUM (PORCINE) 1000 UNIT/ML DIALYSIS
1000.0000 [IU] | INTRAMUSCULAR | Status: DC | PRN
  Filled 2022-11-01: qty 1

## 2022-11-01 NOTE — Progress Notes (Signed)
Ok to reduce famotidine to 20mg  qday due to ESRD per Dr. Glenna Fellows.  Ulyses Southward, PharmD, BCIDP, AAHIVP, CPP Infectious Disease Pharmacist 11/01/2022 12:17 PM

## 2022-11-01 NOTE — Progress Notes (Signed)
[facility] [place]  Subjective  -no complaints, wants to go home   Objective (!) 139/51 76 97.9 F (36.6 C) (Oral) 17 95%  Intake/Output Summary (Last 24 hours) at 11/01/2022 0821 Last data filed at 10/31/2022 1600 Gross per 24 hour  Intake 840 ml  Output 0 ml  Net 840 ml     Left arm aVF incision site soft, no hematoma, drain with no output Nonlabored breathing Comfortable Palpable pulse at the wrist Normal sensorimotor  Laboratory Lab Results: Recent Labs    10/30/22 1814 10/30/22 1955 10/31/22 0814  WBC 5.3 5.4  --   HGB 11.0* 10.2* 10.5*  HCT 34.7* 32.5* 31.0*  PLT 205 201  --     BMET Recent Labs    10/30/22 1814 10/30/22 1955 10/31/22 0814  NA 136 136 135  K 4.1 4.2 5.2*  CL 93* 95* 98  CO2 27 30  --   GLUCOSE 131* 121* 88  BUN 37* 39* 56*  CREATININE 7.71* 8.10* 10.50*  CALCIUM 9.4 9.1  --      COAG Lab Results  Component Value Date   INR 1.3 (H) 10/30/2022   INR 1.2 07/04/2022   INR 2.9 03/06/2022   No results found for: "PTT"  Assessment/Planning:  Patient is postop day 1 left arm I&D, hematoma evacuation.  Drain has been pulled. Pain under control Patient can be discharged home after dialysis today. Will schedule II week follow-up for wound check.  Victorino Sparrow 11/01/2022 8:21 AM --

## 2022-11-01 NOTE — Consult Note (Signed)
[facility] Renal Consultation Note    Indication for Consultation:  Management of ESRD/hemodialysis, anemia, hypertension/volume, and secondary hyperparathyroidism.  HPI: Jane [name] is a 77 y.o. female with PMH including ESRD on dialysis MWF, a.fib, HTN, orthostatic hypotension, and duodenal ulcer  who presented to the ED with hematoma of left upper extremity. She has an olc fistula at that site. Had an I&D on 10/27/22 in the office but fluid collection returned and started to have venous oozing. She has a [facility] for dialysis access. Underwent an I&D with evacuation of the hematoma on 10/31/22. Drain was placed but had no output and has been removed. Nephrology consulted for HD. Plan is to d/c home afterwards. She denies fever, chills, SOB, CP, palpitations, dizziness, abdominal pain, nausea, vomiting. Having a sore throat after intubation and some GERD symptoms, takes famotidine at home.   Past Medical History:  Diagnosis Date   Acid reflux    Anemia of chronic disease    Arthritis    Asthma    AVM (arteriovenous malformation)    Bilateral carotid bruits    Diverticulitis    Duodenal ulcer    Dysrhythmia    a-fib   ESRD (end stage renal disease)    M[facility]   Hemorrhoids    History of blood transfusion    Hypertension    Malaise and fatigue    Orthostatic hypotension    PAF (paroxysmal atrial fibrillation)    Presence of Watchman left atrial appendage closure device 02/06/2022   Watchman FLX 27mm with Dr. Excell Seltzer   Sleep apnea    does not need cpap per patient   Syncope    Tubulovillous adenoma of colon    Past Surgical History:  Procedure Laterality Date   A/V FISTULAGRAM Right 10/18/2020   Procedure: A/V FISTULAGRAM;  Surgeon: Cephus Shelling, MD;  [facility] INVASIVE CV LAB;  Service: Cardiovascular;  Laterality: Right;   A/V FISTULAGRAM Right 09/05/2021   Procedure: A/V Fistulagram;  Surgeon: Cephus Shelling, MD;  Location: [facility] INVASIVE CV  LAB;  Service: Cardiovascular;  Laterality: Right;   AV FISTULA PLACEMENT     AV FISTULA PLACEMENT Left 09/09/2022   Procedure: BRACHIOBASILIC ARTERIOVENOUS (AV) FISTULA CREATION;  Surgeon: Maeola Harman, MD;  Location: [facility] OR;  Service: Vascular;  Laterality: Left;   BACK SURGERY     Lumbar fusion L 4 and L 5   BIOPSY  01/09/2020   Procedure: BIOPSY;  Surgeon: Lemar Lofty., MD;  Location: [facility] ENDOSCOPY;  Service: Gastroenterology;;   BIOPSY  01/31/2021   Procedure: BIOPSY;  Surgeon: Lemar Lofty., MD;  Location: [facility] ENDOSCOPY;  Service: Gastroenterology;;   BIOPSY  09/26/2021   Procedure: BIOPSY;  Surgeon: Tressia Danas, MD;  Location: [facility] ENDOSCOPY;  Service: Gastroenterology;;   COLONOSCOPY     COLONOSCOPY N/A 02/01/2021   Procedure: COLONOSCOPY;  Surgeon: Benancio Deeds, MD;  Location: [facility] ENDOSCOPY;  Service: Gastroenterology;  Laterality: N/A;   COLONOSCOPY WITH PROPOFOL N/A 09/26/2021   Procedure: COLONOSCOPY WITH PROPOFOL;  Surgeon: Tressia Danas, MD;  Location: [facility] ENDOSCOPY;  Service: Gastroenterology;  Laterality: N/A;   COLONOSCOPY WITH PROPOFOL N/A 09/29/2021   Procedure: COLONOSCOPY WITH PROPOFOL;  Surgeon: Tressia Danas, MD;  Location: [facility] ENDOSCOPY;  Service: Gastroenterology;  Laterality: N/A;   COLONOSCOPY WITH PROPOFOL N/A 10/01/2021   Procedure: COLONOSCOPY WITH PROPOFOL;  Surgeon: Napoleon Form, MD;  [facility] ENDOSCOPY;  Service: Gastroenterology;  Laterality: N/A;   ENDOSCOPIC MUCOSAL RESECTION N/A 01/09/2020  Procedure: ENDOSCOPIC MUCOSAL RESECTION;  Surgeon: Meridee Score Netty Starring., MD;  Location: [facility] ENDOSCOPY;  Service: Gastroenterology;  Laterality: N/A;   ENDOSCOPIC MUCOSAL RESECTION N/A 01/31/2021   Procedure: ENDOSCOPIC MUCOSAL RESECTION;  Surgeon: Meridee Score Netty Starring., MD;  Location: [facility] ENDOSCOPY;  Service: Gastroenterology;  Laterality: N/A;   ENTEROSCOPY N/A 09/26/2021   Procedure: ENTEROSCOPY;  Surgeon: Tressia Danas, MD;  Location: [facility] ENDOSCOPY;  Service: Gastroenterology;  Laterality: N/A;   ESOPHAGOGASTRODUODENOSCOPY     ESOPHAGOGASTRODUODENOSCOPY (EGD) WITH PROPOFOL N/A 01/09/2020   Procedure: ESOPHAGOGASTRODUODENOSCOPY (EGD) WITH PROPOFOL;  Surgeon: Meridee Score Netty Starring., MD;  Location: [facility] ENDOSCOPY;  Service: Gastroenterology;  Laterality: N/A;   ESOPHAGOGASTRODUODENOSCOPY (EGD) WITH PROPOFOL N/A 01/13/2020   Procedure: ESOPHAGOGASTRODUODENOSCOPY (EGD) WITH PROPOFOL;  Surgeon: Hilarie Fredrickson, MD;  Location: [facility] ENDOSCOPY;  Service: Gastroenterology;  Laterality: N/A;   ESOPHAGOGASTRODUODENOSCOPY (EGD) WITH PROPOFOL N/A 01/31/2021   Procedure: ESOPHAGOGASTRODUODENOSCOPY (EGD) WITH PROPOFOL;  Surgeon: Meridee Score Netty Starring., MD;  Location: [facility] ENDOSCOPY;  Service: Gastroenterology;  Laterality: N/A;   EUS N/A 01/09/2020   Procedure: UPPER ENDOSCOPIC ULTRASOUND (EUS) RADIAL;  Surgeon: Lemar Lofty., MD;  Location: [facility] ENDOSCOPY;  Service: Gastroenterology;  Laterality: N/A;   FALSE ANEURYSM REPAIR Right 07/26/2022   Procedure: REPAIR RIGHT RADIO-CEPHALIC FISTULA ANEURYSM;  Surgeon: Victorino Sparrow, MD;  Location: [facility] OR;  Service: Vascular;  Laterality: Right;   GIVENS CAPSULE STUDY  09/29/2021   Procedure: GIVENS CAPSULE STUDY;  Surgeon: Tressia Danas, MD;  Location: [facility] ENDOSCOPY;  Service: Gastroenterology;;   HEMOSTASIS CLIP PLACEMENT  01/09/2020   Procedure: HEMOSTASIS CLIP PLACEMENT;  Surgeon: Lemar Lofty., MD;  Location: [facility] ENDOSCOPY;  Service: Gastroenterology;;   HEMOSTASIS CLIP PLACEMENT  01/31/2021   Procedure: HEMOSTASIS CLIP PLACEMENT;  Surgeon: Lemar Lofty., MD;  Location: [facility] ENDOSCOPY;  Service: Gastroenterology;;   HEMOSTASIS CONTROL  01/13/2020   Procedure: HEMOSTASIS CONTROL;  Surgeon: Hilarie Fredrickson, MD;  Location: [facility] ENDOSCOPY;  Service: Gastroenterology;;  hemaspray   HOT HEMOSTASIS N/A 01/13/2020   Procedure: HOT HEMOSTASIS (ARGON PLASMA  COAGULATION/BICAP);  Surgeon: Hilarie Fredrickson, MD;  Location: [facility] ENDOSCOPY;  Service: Gastroenterology;  Laterality: N/A;   HOT HEMOSTASIS N/A 02/01/2021   Procedure: HOT HEMOSTASIS (ARGON PLASMA COAGULATION/BICAP);  Surgeon: Benancio Deeds, MD;  Location: [facility] ENDOSCOPY;  Service: Gastroenterology;  Laterality: N/A;   HOT HEMOSTASIS N/A 10/01/2021   Procedure: HOT HEMOSTASIS (ARGON PLASMA COAGULATION/BICAP);  Surgeon: Napoleon Form, MD;  Location: [facility] ENDOSCOPY;  Service: Gastroenterology;  Laterality: N/A;   I & D EXTREMITY Left 10/31/2022   Procedure: IRRIGATION AND DEBRIDEMENT AND PLACEMENT OF DRAIN LEFT ARM;  Surgeon: Cephus Shelling, MD;  [facility] OR;  Service: Vascular;  Laterality: Left;   INSERTION OF DIALYSIS CATHETER Left 07/26/2022   Procedure: INSERTION OF TUNNELED DIALYSIS CATHETER;  Surgeon: Victorino Sparrow, MD;  Location: [facility] OR;  Service: Vascular;  Laterality: Left;   IR GENERIC HISTORICAL  07/09/2016   IR US GUIDE VASC ACCESS RIGHT 07/09/2016 Oley Balm, MD [facility]   IR GENERIC HISTORICAL  07/09/2016   IR FLUORO GUIDE CV LINE RIGHT 07/09/2016 Oley Balm, MD [facility]   KNEE ARTHROPLASTY Left    LAPAROSCOPIC SIGMOID COLECTOMY N/A 07/11/2016   Procedure: LAPAROSCOPIC SIGMOID COLECTOMY;  Surgeon: Berna Bue, MD;  [facility] OR;  Service: General;  Laterality: N/A;   LEFT ATRIAL APPENDAGE OCCLUSION N/A 02/06/2022   Procedure: LEFT ATRIAL APPENDAGE OCCLUSION;  Surgeon: Tonny Bollman, MD;  Location: [facility] INVASIVE CV LAB;  Service: Cardiovascular;  Laterality: N/A;   LIGATION OF ARTERIOVENOUS  FISTULA Right 07/26/2022   Procedure: LIGATION OF RIGHT RADIO-CEPHALIC ARTERIOVENOUS  FISTULA;  Surgeon: Victorino Sparrow, MD;  Location: [facility] OR;  Service: Vascular;  Laterality: Right;   MASS EXCISION Right 04/10/2020   Procedure: EXCISION SKIN NODULE RIGHT FOREARM;  Surgeon: Maeola Harman, MD;  Location: [facility] OR;  Service: Vascular;  Laterality:  Right;   POLYPECTOMY  09/26/2021   Procedure: POLYPECTOMY;  Surgeon: Tressia Danas, MD;  Location: [facility] ENDOSCOPY;  Service: Gastroenterology;;   POLYPECTOMY  10/01/2021   Procedure: POLYPECTOMY;  Surgeon: Napoleon Form, MD;  Location: [facility] ENDOSCOPY;  Service: Gastroenterology;;   REVISON OF ARTERIOVENOUS FISTULA Right 07/04/2022   Procedure: REVISON OF RIGHT FOREARM ARTERIOVENOUS FISTULA;  Surgeon: Leonie Douglas, MD;  Location: [facility] OR;  Service: Vascular;  Laterality: Right;   SCLEROTHERAPY  01/09/2020   Procedure: Susa Day;  Surgeon: Mansouraty, Netty Starring., MD;  Location: [facility] ENDOSCOPY;  Service: Gastroenterology;;   Susa Day  01/13/2020   Procedure: Susa Day;  Surgeon: Hilarie Fredrickson, MD;  Location: [facility] ENDOSCOPY;  Service: Gastroenterology;;   SUBMUCOSAL LIFTING INJECTION  01/09/2020   Procedure: SUBMUCOSAL LIFTING INJECTION;  Surgeon: Lemar Lofty., MD;  Location: [facility] ENDOSCOPY;  Service: Gastroenterology;;   TEE WITHOUT CARDIOVERSION N/A 02/06/2022   Procedure: TRANSESOPHAGEAL ECHOCARDIOGRAM (TEE);  Surgeon: Tonny Bollman, MD;  Location: [facility] INVASIVE CV LAB;  Service: Cardiovascular;  Laterality: N/A;   TUBAL LIGATION     UPPER EXTREMITY VENOGRAPHY N/A 10/27/2022   Procedure: UPPER EXTREMITY VENOGRAPHY;  Surgeon: Maeola Harman, MD;  Location: [facility] INVASIVE CV LAB;  Service: Cardiovascular;  Laterality: N/A;   Family History  Problem Relation Age of Onset   Heart failure Mother    Stroke Mother    Other Father    Colon cancer Neg Hx    Liver disease Neg Hx    Esophageal cancer Neg Hx    Stomach cancer Neg Hx    Inflammatory bowel disease Neg Hx    Rectal cancer Neg Hx    Pancreatic cancer Neg Hx    Social History:  reports that she has never smoked. She has never been exposed to tobacco smoke. She has never used smokeless tobacco. She reports that she does not drink alcohol and does not use drugs.  ROS: As per HPI otherwise  negative.  Physical Exam: Vitals:   10/31/22 2353 11/01/22 0354 11/01/22 0742 11/01/22 1120  BP: (!) 156/53 (!) 132/46 (!) 139/51 (!) 125/56  Pulse: 66 (!) 53 76   Resp: 20 20 17 19   Temp: 98.1 F (36.7 C) 98.2 F (36.8 C) 97.9 F (36.6 C) 97.9 F (36.6 C)  TempSrc: Oral Oral Oral Oral  SpO2: 96% 97% 95% 95%  Weight:      Height:         General: Well developed, well nourished, in no acute distress. Head: Normocephalic, atraumatic, sclera non-icteric, mucus membranes are moist. Lungs: Clear bilaterally to auscultation without wheezes, rales, or rhonchi. Breathing is unlabored. Heart: RRR with normal S1, S2. No murmurs, rubs, or gallops appreciated. Abdomen: Soft, non-distended with normoactive bowel sounds.  Musculoskeletal:  Strength and tone appear normal for age. Lower extremities: No edema b/l lower extremities Neuro: Alert and oriented X 3. Moves all extremities spontaneously. Psych:  Responds to questions appropriately with a normal affect. Dialysis Access: TDC, R arm bandaged  Allergies  Allergen Reactions   Latex Rash   Penicillins Other (See Comments)    Yeast infection /  Childhood / Rash   Sulfa Antibiotics Rash   Tape Other (See Comments)    Plastic, silicone, and paper tape causes bruising and pulls off skin. Cloth tape works fine   Prior to Admission medications   Medication Sig Start Date End Date Taking? Authorizing Provider  B Complex-C-Zn-Folic Acid (DIALYVITE 800-ZINC 15) 0.8 MG TABS Take 1 tablet by mouth daily. 10/06/22  Yes [provider]  oxyCODONE (ROXICODONE) 5 MG immediate release tablet Take 1 tablet (5 mg total) by mouth every 6 (six) hours as needed. 11/01/22 11/01/23 Yes Lars Mage, PA-C  atorvastatin (LIPITOR) 10 MG tablet Take 1 tablet (10 mg total) by mouth daily. 05/30/22   Yates Decamp, MD  carvedilol (COREG) 12.5 MG tablet Take 1 tablet (12.5 mg total) by mouth 2 (two) times daily with a meal. 07/23/22   [place], [name], MD   cinacalcet [facility]) 30 MG tablet Take 30 mg by mouth every Monday, Wednesday, and Friday with hemodialysis. Post dialysis 12/04/21 12/03/22  [provider]  diltiazem (CARDIZEM) 60 MG tablet Take 1 tablet (60 mg total) by mouth 3 (three) times daily as needed (Atrial fibrillation). 11/21/21   Yates Decamp, MD  famotidine (PEPCID) 40 MG tablet Take 1 tablet (40 mg total) by mouth 2 (two) times daily. 06/09/22   Yates Decamp, MD  folic acid-vitamin b complex-vitamin c-selenium-zinc (DIALYVITE) 3 MG TABS tablet Take 1 tablet by mouth at bedtime.    [provider]  hydrALAZINE (APRESOLINE) 25 MG tablet Take 12.5 mg by mouth in the morning and at bedtime. 08/14/22   [provider]  lansoprazole (PREVACID) 30 MG capsule TAKE ONE CAPSULE BY MOUTH TWICE DAILY 09/03/22   Arnaldo Natal, NP  LINZESS 72 MCG capsule TAKE ONE CAPSULE BY MOUTH BEFORE BREAKFAST Patient taking differently: Take 72 mcg by mouth every other day. 09/03/22   Arnaldo Natal, NP  Methoxy PEG-Epoetin Beta (MIRCERA IJ) Inject as directed.    [provider]  sevelamer carbonate (RENVELA) 800 MG tablet Take 1,600 mg by mouth 2 (two) times daily with a meal. 08/26/21   [provider]  traMADol (ULTRAM) 50 MG tablet Take 1 tablet (50 mg total) by mouth 2 (two) times daily. 10/16/22   Kirsteins, Victorino Sparrow, MD   Current Facility-Administered Medications  Medication Dose Route Frequency Provider Last Rate Last Admin   acetaminophen (TYLENOL) tablet 650 mg  650 mg Oral Q6H PRN Cephus Shelling, MD       Chlorhexidine Gluconate Cloth 2 % PADS 6 each  6 each Topical Q0600 Cephus Shelling, MD   6 each at 11/01/22 0557   guaiFENesin-dextromethorphan (ROBITUSSIN DM) 100-10 MG/5ML syrup 15 mL  15 mL Oral Q4H PRN Cephus Shelling, MD       heparin injection 5,000 Units  5,000 Units Subcutaneous Q8H Cephus Shelling, MD   5,000 Units at 11/01/22 0630   hydrALAZINE (APRESOLINE)  injection 5 mg  5 mg Intravenous Q20 Min PRN Cephus Shelling, MD   5 mg at 11/01/22 1115   HYDROmorphone (DILAUDID) injection 0.5 mg  0.5 mg Intravenous Q4H PRN Cephus Shelling, MD       HYDROmorphone (DILAUDID) tablet 2 mg  2 mg Oral Q4H PRN Cephus Shelling, MD   2 mg at 10/31/22 1956   labetalol (NORMODYNE) injection 10 mg  10 mg Intravenous Q10 min PRN Cephus Shelling, MD       metoprolol tartrate (LOPRESSOR) injection 2-5 mg  2-5 mg Intravenous Q2H PRN Cephus Shelling, MD       ondansetron [facility]) injection 4 mg  4 mg Intravenous Q6H PRN Cephus Shelling, MD       pantoprazole (PROTONIX) EC tablet 40 mg  40 mg Oral Daily Cephus Shelling, MD   40 mg at 11/01/22 0931   phenol (CHLORASEPTIC) mouth spray 1 spray  1 spray Mouth/Throat PRN Cephus Shelling, MD       Labs: Basic Metabolic Panel: Recent Labs  Lab 10/30/22 1814 10/30/22 1955 10/31/22 0814  NA 136 136 135  K 4.1 4.2 5.2*  CL 93* 95* 98  CO2 27 30  --   GLUCOSE 131* 121* 88  BUN 37* 39* 56*  CREATININE 7.71* 8.10* 10.50*  CALCIUM 9.4 9.1  --    Liver Function Tests: Recent Labs  Lab 10/30/22 1955  AST 21  ALT 16  ALKPHOS 158*  BILITOT 0.5  PROT 6.9  ALBUMIN 3.3*   No results for input(s): "LIPASE", "AMYLASE" in the last 168 hours. No results for input(s): "AMMONIA" in the last 168 hours. CBC: Recent Labs  Lab 10/30/22 1814 10/30/22 1955 10/31/22 0814  WBC 5.3 5.4  --   NEUTROABS 2.0  --   --   HGB 11.0* 10.2* 10.5*  HCT 34.7* 32.5* 31.0*  MCV 91.3 91.3  --   PLT 205 201  --    Cardiac Enzymes: No results for input(s): "CKTOTAL", "CKMB", "CKMBINDEX", "TROPONINI" in the last 168 hours. CBG: Recent Labs  Lab 10/31/22 1100 10/31/22 1318 10/31/22 1554 10/31/22 2127 11/01/22 0635  GLUCAP 174* 89 136* 130* 111*   Iron Studies: No results for input(s): "IRON", "TIBC", "TRANSFERRIN", "FERRITIN" in the last 72 hours. Studies/Results: No results found.  Dialysis  Orders: Center: [facility]  on MWF . 180NRe BFR 400 DFR A1.5 EDW 88kg 3 hr 30 min 2K 2Ca Heparin 3000 unit bolus  Assessment/Plan:  Left arm hematoma: near AVF that is no longer in use. S/p I&D, no further drainage reported. Following up with [facility] in 2 weeks.   ESRD:  Missed HD yesterday due to surgery, will plan for HD today, can d/c after if stable. Resume MWF schedule on Monday  Hypertension/volume: BP controlled, no volume overload on exam.   Anemia: Hgb 10.5. Continue ESA outpatient   Metabolic bone disease: Last calcium a little bit elevated, holding VDRA for now. Continue phos binders  A.fib- reports transient tachycardia post op, now resolved. On diltiazem and coreg outpatient  Rogers Blocker, PA-C 11/01/2022, 12:00 PM  [facility] Pager: [phone]

## 2022-11-01 NOTE — Discharge Instructions (Signed)
Dry compression dressing except for bathing.  Gradually increase active use of yyur left UE.

## 2022-11-03 ENCOUNTER — Telehealth: Payer: Self-pay

## 2022-11-03 ENCOUNTER — Ambulatory Visit: Payer: Self-pay

## 2022-11-03 DIAGNOSIS — D631 Anemia in chronic kidney disease: Secondary | ICD-10-CM | POA: Diagnosis not present

## 2022-11-03 DIAGNOSIS — Z992 Dependence on renal dialysis: Secondary | ICD-10-CM | POA: Diagnosis not present

## 2022-11-03 DIAGNOSIS — N2581 Secondary hyperparathyroidism of renal origin: Secondary | ICD-10-CM | POA: Diagnosis not present

## 2022-11-03 DIAGNOSIS — N186 End stage renal disease: Secondary | ICD-10-CM | POA: Diagnosis not present

## 2022-11-03 NOTE — Telephone Encounter (Signed)
Pt called with concerns about drainage from LUA hematoma site s/p evacuation of hematoma. She has had about two days of thick, black drainage. Per MD, pt to be added on to APP schedule for tomorrow. Pt is aware and verbalized understanding of this appt.

## 2022-11-03 NOTE — Chronic Care Management (AMB) (Signed)
   11/03/2022  Laurene Footman Vicens 1945/08/25 [id number]   Reason for Encounter: Patient is not currently enrolled in the CCM program. CCM status changed to previously enrolled.   Katha Cabal RN Care Manager/Chronic Care Management [phone]

## 2022-11-03 NOTE — Transitions of Care (Post Inpatient/ED Visit) (Signed)
   11/03/2022  Name: Jane [name] MRN: [id number] DOB: 09/10/1945  Today's TOC FU Call Status: Today's TOC FU Call Status:: Successful TOC FU Call Competed TOC FU Call Complete Date: 11/03/22  Transition Care Management Follow-up Telephone Call Date of Discharge: 11/01/22 Discharge Facility: [facility]) Type of Discharge: Inpatient Admission Primary Inpatient Discharge Diagnosis:: "hematoma" How have you been since you were released from the hospital?: Same (Pt states she did not have a good weekend-she had ongoing pain and has had some drainage on dressing) Any questions or concerns?: Yes Patient Questions/Concerns:: pt states she did nto get pain med at discharge-thought provider sent [facility] to [facility] -dtr went to get med and not there-she did not contact surgeon office to follow up Patient Questions/Concerns Addressed: Other: (Reviewd d/c instructions w/ pt & noted that Oxycodone had been sent to [facility]-pt states she does use that pharmacy as well-voices they are closed on the weekend so that's why she didn't get med-will call pharmacy now to have med delivered) Patient advised that if [facility] does not have pharmacy to contact surgeon office to follow up on pain med and need to have script sent to pharmacy to help manage pain. Patient states she is also having some drainage/leakage to dressing-dialysis staff reinforced drsg and provided her with some gauze. She will follow up with surgeon office-denies needing any assistance with contacting pharmacy or surgeon office. Items Reviewed: Did you receive and understand the discharge instructions provided?: Yes Medications obtained and verified?: No (pt currently headed home from HD tx) Any new allergies since your discharge?: No Dietary orders reviewed?: Yes Type of Diet Ordered:: renal/heart healthy Do you have support at home?: No-lives alone-daughter helps her out  Home Care and Equipment/Supplies: Were Home Health Services Ordered?:  NA Any new equipment or medical supplies ordered?: NA  Functional Questionnaire: Do you need assistance with bathing/showering or dressing?: No Do you need assistance with meal preparation?: No Do you need assistance with eating?: No Do you have difficulty maintaining continence: No Do you need assistance with getting out of bed/getting out of a chair/moving?: No Do you have difficulty managing or taking your medications?: No  Follow up appointments reviewed: PCP Follow-up appointment confirmed?: NA Specialist Hospital Follow-up appointment confirmed?: No Reason Specialist Follow-Up Not Confirmed: Patient has Specialist Provider Number and will Call for Appointment (patient will contact surgeon office today to alert of leaking drsg and make follow up appt) Do you need transportation to your follow-up appointment?: No Do you understand care options if your condition(s) worsen?: Yes-patient verbalized understanding   TOC Interventions Today    Flowsheet Row Most Recent Value  TOC Interventions   TOC Interventions Discussed/Reviewed TOC Interventions Discussed, Post discharge activity limitations per provider, Post op wound/incision care, S/S of infection      Interventions Today    Flowsheet Row Most Recent Value  General Interventions   General Interventions Discussed/Reviewed General Interventions Discussed, Doctor Visits  Doctor Visits Discussed/Reviewed Doctor Visits Discussed, Specialist  PCP/Specialist Visits Compliance with follow-up visit  Education Interventions   Education Provided Provided Education  Provided Verbal Education On Medication, When to see the doctor, Other, Nutrition  Nutrition Interventions   Nutrition Discussed/Reviewed Nutrition Discussed, Adding fruits and vegetables, Decreasing salt  Pharmacy Interventions   Pharmacy Dicussed/Reviewed Pharmacy Topics Discussed, Medications and their functions       [facility]  Care Management Care Management Community Coordinator Direct Phone: [phone] Toll Free: [phone] Fax: [phone]

## 2022-11-04 ENCOUNTER — Ambulatory Visit (INDEPENDENT_AMBULATORY_CARE_PROVIDER_SITE_OTHER): Payer: Medicare Other | Admitting: Physician Assistant

## 2022-11-04 VITALS — BP 156/68 | HR 72 | Temp 97.3°F | Resp 18 | Ht 65.0 in | Wt 193.8 lb

## 2022-11-04 DIAGNOSIS — N186 End stage renal disease: Secondary | ICD-10-CM

## 2022-11-04 DIAGNOSIS — Z992 Dependence on renal dialysis: Secondary | ICD-10-CM

## 2022-11-04 LAB — HEPATITIS B SURFACE ANTIBODY, QUANTITATIVE: Hep B S AB Quant (Post): 118 m[IU]/mL (ref >9.9)

## 2022-11-04 NOTE — Progress Notes (Signed)
POST OPERATIVE OFFICE NOTE    CC:  F/u for surgery  HPI:  This is a 77 y.o. female who is s/p I&D left arm hematoma and evacuation on 10/31/2022 by Dr. Chestine Spore.   By POD 1, her drain was removed and she was scheduled for 2 week follow up for wound check.   On 10/27/2022, she underwent venography and subseqeunt open drainage of left arm hematoma by Dr. Randie Heinz.    On 07/26/2022, she had a right arm RC AVF ligation and excision and TDC placement by Dr. Karin Lieu.  On 09/09/2022, she had left 1st stage BVT by Dr. Randie Heinz.  This was noted to be occluded at time of procedure on 4/12.   She comes in today with concerns of some black thick bloody drainage from the incision.  She states that the swelling is better and her arm is less painful.  She states that she feels very tired and fatigued since anesthesia.  Pt states she did have some pain in the first two fingers but that has resolved since the fistula occluded.    The pt is on dialysis M/W/F at [facility] [address] location.   Allergies  Allergen Reactions   Latex Rash   Penicillins Other (See Comments)    Yeast infection / Childhood / Rash   Sulfa Antibiotics Rash   Tape Other (See Comments)    Plastic, silicone, and paper tape causes bruising and pulls off skin. Cloth tape works fine    Current Outpatient Medications  Medication Sig Dispense Refill   atorvastatin (LIPITOR) 10 MG tablet Take 1 tablet (10 mg total) by mouth daily. 90 tablet 1   B Complex-C-Zn-Folic Acid (DIALYVITE 800-ZINC 15) 0.8 MG TABS Take 1 tablet by mouth daily.     carvedilol (COREG) 12.5 MG tablet Take 1 tablet (12.5 mg total) by mouth 2 (two) times daily with a meal. 180 tablet 1   cinacalcet (SENSIPAR) 30 MG tablet Take 30 mg by mouth every Monday, Wednesday, and Friday with hemodialysis. Post dialysis     diltiazem (CARDIZEM) 60 MG tablet Take 1 tablet (60 mg total) by mouth 3 (three) times daily as needed (Atrial fibrillation). 30 tablet 1   famotidine (PEPCID) 20 MG  tablet Take 1 tablet (20 mg total) by mouth daily. 30 tablet 1   folic acid-vitamin b complex-vitamin c-selenium-zinc (DIALYVITE) 3 MG TABS tablet Take 1 tablet by mouth at bedtime.     hydrALAZINE (APRESOLINE) 25 MG tablet Take 12.5 mg by mouth in the morning and at bedtime.     lansoprazole (PREVACID) 30 MG capsule TAKE ONE CAPSULE BY MOUTH TWICE DAILY 60 capsule 2   LINZESS 72 MCG capsule TAKE ONE CAPSULE BY MOUTH BEFORE BREAKFAST (Patient taking differently: Take 72 mcg by mouth every other day.) 30 capsule 2   Methoxy PEG-Epoetin Beta (MIRCERA IJ) Inject as directed.     oxyCODONE (ROXICODONE) 5 MG immediate release tablet Take 1 tablet (5 mg total) by mouth every 6 (six) hours as needed. 12 tablet 0   sevelamer carbonate (RENVELA) 800 MG tablet Take 1,600 mg by mouth 2 (two) times daily with a meal.     traMADol (ULTRAM) 50 MG tablet Take 1 tablet (50 mg total) by mouth 2 (two) times daily. 60 tablet 0   Current Facility-Administered Medications  Medication Dose Route Frequency Provider Last Rate Last Admin   0.9 %  sodium chloride infusion  250 mL Intravenous PRN Maeola Harman, MD  betamethasone acetate-betamethasone sodium phosphate (CELESTONE) injection 12 mg  12 mg Intramuscular Once Kirsteins, Victorino Sparrow, MD       lidocaine (XYLOCAINE) 1 % (with pres) injection 4 mL  4 mL Other Once Kirsteins, Victorino Sparrow, MD       sodium chloride flush (NS) 0.9 % injection 3 mL  3 mL Intravenous Q12H Maeola Harman, MD         ROS:  See HPI  Physical Exam:  Today's Vitals   11/04/22 0952  BP: (!) 156/68  Pulse: 72  Resp: 18  Temp: (!) 97.3 F (36.3 C)  TempSrc: Temporal  SpO2: 99%  Weight: 193 lb 12.8 oz (87.9 kg)  Height: 5\' 5"  (1.651 m)  PainSc: 0-No pain   Body mass index is 32.25 kg/m.   Incision:  healing nicely with moderate hematoma.  Skin is not taunt   Extremities:   There is a palpable raidal pulse bilaterally Motor and sensory are in tact.      Assessment/Plan:  This is a 77 y.o. female who is s/p: I&D left arm hematoma and evacuation on 10/31/2022 by Dr. Chestine Spore.   By POD 1, her drain was removed and she was scheduled for 2 week follow up for wound check.   On 10/27/2022, she underwent venography and subseqeunt open drainage of left arm hematoma by Dr. Randie Heinz.    -pt's incision looks good today.  She came in with concerns of black thick drainage that has improved.  She states the swelling is also much better.  She is feeling very fatigued from the anesthesia.  She has appt on 5/1, which she will keep.  At that time, we can determine how she is feeling and timing of right arm access, which she states is planned with Dr. Randie Heinz.  -it sounds as if she had some mild steal symptoms in the left hand.  I discussed steal syndrome with pt and she knows that if she ever has severe symptoms in her hand on the side of access, she will contact us.   -a mild ace wrap was placed over her hematoma.    Doreatha Massed, [facility] [phone]  Clinic MD:  Chestine Spore

## 2022-11-05 DIAGNOSIS — D631 Anemia in chronic kidney disease: Secondary | ICD-10-CM | POA: Diagnosis not present

## 2022-11-05 DIAGNOSIS — N2581 Secondary hyperparathyroidism of renal origin: Secondary | ICD-10-CM | POA: Diagnosis not present

## 2022-11-05 DIAGNOSIS — Z992 Dependence on renal dialysis: Secondary | ICD-10-CM | POA: Diagnosis not present

## 2022-11-05 DIAGNOSIS — N186 End stage renal disease: Secondary | ICD-10-CM | POA: Diagnosis not present

## 2022-11-06 ENCOUNTER — Encounter: Payer: Medicare Other | Attending: Physical Medicine & Rehabilitation | Admitting: Physical Medicine & Rehabilitation

## 2022-11-06 ENCOUNTER — Encounter: Payer: Self-pay | Admitting: Physical Medicine & Rehabilitation

## 2022-11-06 VITALS — BP 172/54 | HR 87 | Ht 65.0 in | Wt 193.2 lb

## 2022-11-06 DIAGNOSIS — G8929 Other chronic pain: Secondary | ICD-10-CM | POA: Diagnosis not present

## 2022-11-06 DIAGNOSIS — M25561 Pain in right knee: Secondary | ICD-10-CM | POA: Insufficient documentation

## 2022-11-06 MED ORDER — TRIAMCINOLONE ACETONIDE 32 MG IX SRER
32.0000 mg | Freq: Once | INTRA_ARTICULAR | Status: AC
  Administered 2022-11-06: 32 mg via INTRA_ARTICULAR

## 2022-11-06 NOTE — Progress Notes (Signed)
Knee injection Right with ultrasound guidance)  Indication:R Knee pain not relieved by medication management and other conservative care.  Informed consent was obtained after describing risks and benefits of the procedure with the patient, this includes bleeding, bruising, infection and medication side effects. The patient wishes to proceed and has given written consent. The patient was placed in a recumbent position. The medial aspect of the knee was marked and prepped with Betadine and alcohol. It was then entered with a 25-gauge 1-1/2 inch needle and 1 mL of 1% lidocaine was injected into the skin and subcutaneous tissue. Then another needle was inserted into the knee joint. After negative draw back for blood, a solution containing Zilretta (traimcinolone 32mg  /18ml) were injected. The patient tolerated the procedure well. Post procedure instructions were given.

## 2022-11-07 DIAGNOSIS — N186 End stage renal disease: Secondary | ICD-10-CM | POA: Diagnosis not present

## 2022-11-07 DIAGNOSIS — N2581 Secondary hyperparathyroidism of renal origin: Secondary | ICD-10-CM | POA: Diagnosis not present

## 2022-11-07 DIAGNOSIS — Z992 Dependence on renal dialysis: Secondary | ICD-10-CM | POA: Diagnosis not present

## 2022-11-07 DIAGNOSIS — D631 Anemia in chronic kidney disease: Secondary | ICD-10-CM | POA: Diagnosis not present

## 2022-11-10 DIAGNOSIS — N186 End stage renal disease: Secondary | ICD-10-CM | POA: Diagnosis not present

## 2022-11-10 DIAGNOSIS — N2581 Secondary hyperparathyroidism of renal origin: Secondary | ICD-10-CM | POA: Diagnosis not present

## 2022-11-10 DIAGNOSIS — D631 Anemia in chronic kidney disease: Secondary | ICD-10-CM | POA: Diagnosis not present

## 2022-11-10 DIAGNOSIS — Z992 Dependence on renal dialysis: Secondary | ICD-10-CM | POA: Diagnosis not present

## 2022-11-11 ENCOUNTER — Other Ambulatory Visit: Payer: Self-pay | Admitting: Family Medicine

## 2022-11-11 MED ORDER — HYDRALAZINE HCL 25 MG PO TABS: 12.5000 mg | ORAL_TABLET | Freq: Two times a day (BID) | ORAL | 3 refills | Status: DC

## 2022-11-12 DIAGNOSIS — N186 End stage renal disease: Secondary | ICD-10-CM | POA: Diagnosis not present

## 2022-11-12 DIAGNOSIS — Z992 Dependence on renal dialysis: Secondary | ICD-10-CM | POA: Diagnosis not present

## 2022-11-12 DIAGNOSIS — D631 Anemia in chronic kidney disease: Secondary | ICD-10-CM | POA: Diagnosis not present

## 2022-11-12 DIAGNOSIS — N2581 Secondary hyperparathyroidism of renal origin: Secondary | ICD-10-CM | POA: Diagnosis not present

## 2022-11-13 ENCOUNTER — Telehealth: Payer: Self-pay

## 2022-11-13 ENCOUNTER — Ambulatory Visit: Payer: Medicare Other | Admitting: Cardiology

## 2022-11-13 ENCOUNTER — Encounter: Payer: Self-pay | Admitting: Cardiology

## 2022-11-13 VITALS — BP 171/68 | HR 56 | Resp 16 | Ht 65.0 in | Wt 196.8 lb

## 2022-11-13 DIAGNOSIS — N186 End stage renal disease: Secondary | ICD-10-CM | POA: Diagnosis not present

## 2022-11-13 DIAGNOSIS — I10 Essential (primary) hypertension: Secondary | ICD-10-CM | POA: Diagnosis not present

## 2022-11-13 DIAGNOSIS — I480 Paroxysmal atrial fibrillation: Secondary | ICD-10-CM

## 2022-11-13 DIAGNOSIS — I951 Orthostatic hypotension: Secondary | ICD-10-CM

## 2022-11-13 DIAGNOSIS — Z992 Dependence on renal dialysis: Secondary | ICD-10-CM | POA: Diagnosis not present

## 2022-11-13 NOTE — Telephone Encounter (Signed)
Patient home blood pressure is fairly controlled on current regimen.  30-d average blood pressure 140/77 mmHg 30-d average heart rate 67 bpm  Date Sys (mmHg)  Dia (mmHg) HR     11/13/22 8:17 AM 149 / 102 56     11/11/22 9:45 AM 132 / 87 65     11/10/22 3:54 AM 144 / 86 64     11/09/22 7:56 AM 135 / 77 62     11/07/22 3:58 PM 135 / 75 67     11/06/22 6:37 PM 156 / 92 63     11/06/22 4:59 AM 138 / 75 64     11/02/22 10:47 AM 137 / 52 69    10/30/22 8:24 AM 133 / 71 61     10/29/22 6:33 PM 114 / 66 77     10/28/22 7:02 AM 146 / 85 67     10/27/22 4:22 AM 143 / 74 66     10/26/22 6:36 AM 158 / 86 79     10/25/22 9:11 AM 162 / 81 66     10/24/22 11:41 PM 123 / 57 66     10/24/22 11:59 AM 174 / 97 66

## 2022-11-13 NOTE — Progress Notes (Signed)
Primary Physician/Referring:  [place], [name], MD  [name], female    DOB: [birth year], 77 y.o.   MRN: [id number]  Chief Complaint  Patient presents with   Atrial Fibrillation   Hypertension   Orthostatic hypotension with orthostasis   HPI:    Jane [name]  is a 77 y.o. female with paroxysmal atrial fibrillation, end-stage disease on hemodialysis on Monday Wednesday and Friday, primary hypertension and orthostatic hypotension, obstructive sleep apnea not on CPAP.   Due to recurrent GI bleed including gastritis, gastric polyps, colonic polyps, AVMs,  in spite of partial colectomy in 2019 She underwent left atrial appendage closure on 02/06/2022 with implantation of a 27 mm watchman flex device.  She also underwent right forearm AV fistula ligated on 07/26/2022 due to recurrent bleeding after dialysis and inability to control bleeding.  She now has a Groshong catheter in the subclavian artery.    Except for back pain, she has no specific complaints today.  She has not had any significant palpitations.  This is a 16-month office visit.  Past Medical History:  Diagnosis Date   Acid reflux    Anemia of chronic disease    Arthritis    Asthma    AVM (arteriovenous malformation)    Bilateral carotid bruits    Diverticulitis    Duodenal ulcer    Dysrhythmia    a-fib   ESRD (end stage renal disease)    [facility]   Hemorrhoids    History of blood transfusion    Hypertension    Malaise and fatigue    Orthostatic hypotension    PAF (paroxysmal atrial fibrillation)    Presence of Watchman left atrial appendage closure device 02/06/2022   Watchman FLX 27mm with Dr. Excell Seltzer   Sleep apnea    does not need cpap per patient   Syncope    Tubulovillous adenoma of colon    Social History   Tobacco Use   Smoking status: Never    Passive exposure: Never   Smokeless tobacco: Never  Substance Use Topics   Alcohol use: No   ROS  Review of Systems  Cardiovascular:   Negative for chest pain, dyspnea on exertion, leg swelling and palpitations.   Objective  Blood pressure (!) 171/68, pulse (!) 56, resp. rate 16, height 5\' 5"  (1.651 m), weight 196 lb 12.8 oz (89.3 kg), SpO2 97 %.      11/13/2022    2:50 PM 11/06/2022   11:50 AM 11/04/2022    9:52 AM  Vitals with BMI  Height 5\' 5"  5\' 5"  5\' 5"   Weight 196 lbs 13 oz 193 lbs 3 oz 193 lbs 13 oz  BMI 32.75 32.15 32.25  Systolic 171 172 782  Diastolic 68 54 68  Pulse 56 87 72    Orthostatic VS for the past 72 hrs (Last 3 readings):  Orthostatic BP Patient Position BP Location Cuff Size Orthostatic Pulse  11/13/22 1510 151/70 Standing Right Arm Large 64  11/13/22 1509 157/72 Sitting Right Arm Large 63  11/13/22 1502 164/66 Supine Right Arm Large 59     Physical Exam Constitutional:      Comments: Moderately obese  Neck:     Vascular: Carotid bruit (Bilateral) present. No JVD.  Cardiovascular:     Rate and Rhythm: Regular rhythm. Bradycardia present.     Pulses: Intact distal pulses.     Heart sounds: Murmur heard.     Early systolic murmur is present with a  grade of 2/6 at the upper right sternal border.     No gallop.  Pulmonary:     Effort: Pulmonary effort is normal.     Breath sounds: Normal breath sounds.  Abdominal:     General: Bowel sounds are normal.     Palpations: Abdomen is soft.  Musculoskeletal:     Right lower leg: No edema.     Left lower leg: No edema.    Laboratory examination:   Recent Labs    10/30/22 1814 10/30/22 1955 10/31/22 0814 11/01/22 1657  NA 136 136 135 134*  K 4.1 4.2 5.2* 4.7  CL 93* 95* 98 93*  CO2 27 30  --  26  GLUCOSE 131* 121* 88 113*  BUN 37* 39* 56* 64*  CREATININE 7.71* 8.10* 10.50* 11.81*  CALCIUM 9.4 9.1  --  8.6*  GFRNONAA 5* 5*  --  3*      Latest Ref Rng & Units 11/01/2022    4:57 PM 10/31/2022    8:14 AM 10/30/2022    7:55 PM  CMP  Glucose 70 - 99 mg/dL 409  88  811   BUN 8 - 23 mg/dL 64  56  39   Creatinine 0.44 - 1.00 mg/dL  91.47  82.95  6.21   Sodium 135 - 145 mmol/L 134  135  136   Potassium 3.5 - 5.1 mmol/L 4.7  5.2  4.2   Chloride 98 - 111 mmol/L 93  98  95   CO2 22 - 32 mmol/L 26   30   Calcium 8.9 - 10.3 mg/dL 8.6   9.1   Total Protein 6.5 - 8.1 g/dL   6.9   Total Bilirubin 0.3 - 1.2 mg/dL   0.5   Alkaline Phos 38 - 126 U/L   158   AST 15 - 41 U/L   21   ALT 0 - 44 U/L   16       Latest Ref Rng & Units 11/01/2022    4:57 PM 10/31/2022    8:14 AM 10/30/2022    7:55 PM  CBC  WBC 4.0 - 10.5 K/uL 7.5   5.4   Hemoglobin 12.0 - 15.0 g/dL 9.2  30.8  65.7   Hematocrit 36.0 - 46.0 % 29.5  31.0  32.5   Platelets 150 - 400 K/uL 200   201    Lab Results  Component Value Date   TSH 1.36 11/20/2020    Lab Results  Component Value Date   CHOL 198 10/06/2019   HDL 53 10/06/2019   LDLCALC 135 (H) 10/06/2019   TRIG 51 10/06/2019   CHOLHDL 3.7 10/06/2019    Radiology:   No results found.  Cardiac Studies:    Lexiscan myoview stress test 09/06/2018:  1. Lexiscan stress test was performed. Exercise capacity was not assessed. Stress symptoms included dyspnea, dizziness. Peak effect blood pressure was 158/68 mmHg. The resting and stress electrocardiogram demonstrated normal sinus rhythm, normal resting conduction, no resting arrhythmias and normal rest repolarization.  Stress EKG is non diagnostic for ischemia as it is a pharmacologic stress.  2. The overall quality of the study is good. There is no evidence of abnormal lung activity. Stress and rest SPECT images demonstrate homogeneous tracer distribution throughout the myocardium. Gated SPECT imaging reveals normal myocardial thickening and wall motion. The left ventricular ejection fraction was normal (61%).   3. Low risk study.  Carotid artery duplex  04/24/2020: Stenosis in the right external carotid artery (<  50%). Minimal stenosis in the left internal carotid artery (minimal). Stenosis in the left common carotid artery (<50%). There is mild diffuse  heterogeneous plaque in bilateral carotid arteries. Antegrade right vertebral artery flow.  Follow up in one year is appropriate if clinically indicated. No significant change from 12/02/2016.  Echocardiogram 12/27/2020: Normal LV systolic function with visual EF 60-65%. Left ventricle cavity is normal in size. Moderate left ventricular hypertrophy. Normal global wallmotion. Indeterminate diastolic filling pattern, elevated LAP. No significant valvular heart disease. Compared to study dated 11/24/2017: LVEF is preserved, G2DD is now indeterminate with elevated LAP, MR and TR have improved, otherwise nosignificant change.  EKG  EKG 11/13/2022: Normal sinus rhythm at the rate of 59 bpm, poor R progression, probably normal variant.  No evidence of ischemia.  No significant change from 08/14/2022.  Medications and allergies   Allergies  Allergen Reactions   Latex Rash   Penicillins Other (See Comments)    Yeast infection / Childhood / Rash   Sulfa Antibiotics Rash   Tape Other (See Comments)    Plastic, silicone, and paper tape causes bruising and pulls off skin. Cloth tape works fine    Current Outpatient Medications:    atorvastatin (LIPITOR) 10 MG tablet, Take 1 tablet (10 mg total) by mouth daily., Disp: 90 tablet, Rfl: 1   B Complex-C-Zn-Folic Acid (DIALYVITE 800-ZINC 15) 0.8 MG TABS, Take 1 tablet by mouth daily., Disp: , Rfl:    carvedilol (COREG) 12.5 MG tablet, Take 1 tablet (12.5 mg total) by mouth 2 (two) times daily with a meal., Disp: 180 tablet, Rfl: 1   cinacalcet (SENSIPAR) 30 MG tablet, Take 30 mg by mouth every Monday, Wednesday, and Friday with hemodialysis. Post dialysis, Disp: , Rfl:    diltiazem (CARDIZEM) 60 MG tablet, Take 1 tablet (60 mg total) by mouth 3 (three) times daily as needed (Atrial fibrillation)., Disp: 30 tablet, Rfl: 1   famotidine (PEPCID) 20 MG tablet, Take 1 tablet (20 mg total) by mouth daily., Disp: 30 tablet, Rfl: 1   folic acid-vitamin b  complex-vitamin c-selenium-zinc (DIALYVITE) 3 MG TABS tablet, Take 1 tablet by mouth at bedtime., Disp: , Rfl:    hydrALAZINE (APRESOLINE) 25 MG tablet, Take 0.5 tablets (12.5 mg total) by mouth in the morning and at bedtime., Disp: 45 tablet, Rfl: 3   lansoprazole (PREVACID) 30 MG capsule, TAKE ONE CAPSULE BY MOUTH TWICE DAILY (Patient taking differently: Take 30 mg by mouth daily.), Disp: 60 capsule, Rfl: 2   LINZESS 72 MCG capsule, TAKE ONE CAPSULE BY MOUTH BEFORE BREAKFAST (Patient taking differently: Take 72 mcg by mouth every other day.), Disp: 30 capsule, Rfl: 2   Methoxy PEG-Epoetin Beta (MIRCERA IJ), Inject as directed., Disp: , Rfl:    sevelamer carbonate (RENVELA) 800 MG tablet, Take 1,600 mg by mouth 2 (two) times daily with a meal., Disp: , Rfl:   Current Facility-Administered Medications:    0.9 %  sodium chloride infusion, 250 mL, Intravenous, PRN, Maeola Harman, MD   betamethasone acetate-betamethasone sodium phosphate (CELESTONE) injection 12 mg, 12 mg, Intramuscular, Once, Kirsteins, Victorino Sparrow, MD   lidocaine (XYLOCAINE) 1 % (with pres) injection 4 mL, 4 mL, Other, Once, Kirsteins, Victorino Sparrow, MD   sodium chloride flush (NS) 0.9 % injection 3 mL, 3 mL, Intravenous, Q12H, Maeola Harman, MD   Assessment     ICD-10-CM   1. Paroxysmal atrial fibrillation  I48.0 EKG 12-Lead    2. Primary hypertension  I10  3. Orthostatic hypotension  I95.1     4. ESRD on hemodialysis  N18.6    Z99.2       CHA2DS2-VASc Score is 4.  Yearly risk of stroke: 4.8% (HTN, female, age 54).  Score of 1=0.6; 2=2.2; 3=3.2; 4=4.8; 5=7.2; 6=9.8; 7=>9.8) -(CHF; HTN; vasc disease DM,  Female = 1; Age <65 =0; 65-74 = 1,  >75 =2; stroke/embolism= 2).   No orders of the defined types were placed in this encounter.  Medications Discontinued During This Encounter  Medication Reason   oxyCODONE (ROXICODONE) 5 MG immediate release tablet    traMADol (ULTRAM) 50 MG tablet      Recommendations:   Jane [name]  is a 77 y.o. female with paroxysmal atrial fibrillation, end-stage disease on hemodialysis on Monday Wednesday and Friday, primary hypertension and orthostatic hypotension, obstructive sleep apnea not on CPAP.  Due to recurrent GI bleed including gastritis, gastric polyps, colonic polyps, AVMs,  in spite of partial colectomy in 2019 She underwent left atrial appendage closure on 02/06/2022 with implantation of a 27 mm watchman flex device.  1. Paroxysmal atrial fibrillation (HCC) She is presently maintaining sinus rhythm.  Not on anticoag due to recurrent GI bleed. - EKG 12-Lead  2. Primary hypertension Blood pressure is elevated today however patient states that her blood pressure has been very well-controlled and in fact hydralazine was reduced by nephrology due to low blood pressure.  She has back pain today and this may have contributed to her elevated blood pressure, she would like to continue present medications.  Also home blood pressure recordings have been under excellent control.  3. Orthostatic hypotension She has history of orthostatic hypotension, she was not severely orthostatic today.  No dizziness or syncope.  4. ESRD on hemodialysis [facility]) Patient has had fairly right antecubital AV fistula and also had left AV fistula placed however had developed bleeding complications.  She now has a Groshong catheter in the left subclavian vein, there are plans to place right arm AV fistula soon.  In view of recurrent thrombosis, we could consider Plavix as a prophylaxis.  Obviously if she has GI bleed we could certainly stop this.  RPM Update: Patient home blood pressure is fairly controlled on current regimen.   30-d average blood pressure 140/77 mmHg 30-d average heart rate 67 bpm   Date     Sys (mmHg)                Dia (mmHg)     HR                                            11/13/22 8:17 AM         149      /           102      56                                  11/11/22 9:45 AM         132      /           87        65  11/10/22 3:54 AM         144      /           86        64                                 11/09/22 7:56 AM         135      /           77        62                                 11/07/22 3:58 PM         135      /           75        67                                 11/06/22 6:37 PM         156      /           92        63                                 11/06/22 4:59 AM         138      /           75        64                                 11/02/22 10:47 AM       137      /           52        69                     10/30/22 8:24 AM         133      /           71        61                                 10/29/22 6:33 PM         114      /           66        77                                 10/28/22 7:02 AM           146      /           85        67  10/27/22 4:22 AM           143      /           74        66                                 10/26/22 6:36 AM           158      /           86        79                                 10/25/22 9:11 AM           162      /           81        66                                 10/24/22 11:41 PM         123      /           57        66                                 10/24/22 11:59 AM         174      /           97        66      Yates Decamp, MD, [facility] 11/13/2022, 3:37 PM Office: [phone]

## 2022-11-14 ENCOUNTER — Telehealth: Payer: Self-pay

## 2022-11-14 DIAGNOSIS — D631 Anemia in chronic kidney disease: Secondary | ICD-10-CM | POA: Diagnosis not present

## 2022-11-14 DIAGNOSIS — N2581 Secondary hyperparathyroidism of renal origin: Secondary | ICD-10-CM | POA: Diagnosis not present

## 2022-11-14 DIAGNOSIS — N186 End stage renal disease: Secondary | ICD-10-CM | POA: Diagnosis not present

## 2022-11-14 DIAGNOSIS — Z992 Dependence on renal dialysis: Secondary | ICD-10-CM | POA: Diagnosis not present

## 2022-11-14 NOTE — Progress Notes (Signed)
Care Management & Coordination Services Pharmacy Team  Reason for Encounter: Appointment Reminder  Contacted patient to confirm telephone appointment with Jane [name], PharmD on 11/17/2022 at 3:45. Unsuccessful outreach. Left voicemail for patient to return call.   Care Gaps: AWV - completed 08/21/2022 Last BP - 171/68 on 11/13/2022 Shingrix - never done Covid - overdue   Star Rating Drug: Atorvastatin 10 mg - last filled 11/05/2022 90 DS at [facility]   [name] [facility]  Clinical Pharmacist Assistant [phone]

## 2022-11-14 NOTE — Discharge Summary (Signed)
[facility] Discharge Summary   Patient ID:  Jane [name] MRN: [id number] DOB/AGE: 1945/12/05 77 y.o.  Admit date: 10/30/2022 Discharge date: 11/14/2022 Attending Surgeon: Lenell Antu Admission Diagnosis: Hematoma [T14.8XXA] Postoperative surgical complication involving circulatory system associated with circulatory procedure, unspecified complication [I97.89]  Discharge Diagnoses:  Hematoma [T14.8XXA] Postoperative surgical complication involving circulatory system associated with circulatory procedure, unspecified complication [I97.89]  Secondary Diagnoses: Past Medical History:  Diagnosis Date   Acid reflux    Anemia of chronic disease    Arthritis    Asthma    AVM (arteriovenous malformation)    Bilateral carotid bruits    Diverticulitis    Duodenal ulcer    Dysrhythmia    a-fib   ESRD (end stage renal disease) (HCC)    M[facility]   Hemorrhoids    History of blood transfusion    Hypertension    Malaise and fatigue    Orthostatic hypotension    PAF (paroxysmal atrial fibrillation) (HCC)    Presence of Watchman left atrial appendage closure device 02/06/2022   Watchman FLX 27mm with Dr. Excell Seltzer   Sleep apnea    does not need cpap per patient   Syncope    Tubulovillous adenoma of colon     Procedures: 10/31/2022 Procedure(s): IRRIGATION AND DEBRIDEMENT AND PLACEMENT OF DRAIN LEFT ARM  Discharged Condition: good  HPI: Jane [name] is a 77 y.o. female who presents to the hospital for evaluation of bleeding from her left arm.  She underwent a venogram and incision and drainage of left antecubital fossa hematoma on 10/27/2022.  Prior to this, she had undergone left arm basilic vein transposition on 09/09/2022.  After the basilic vein transposition, she developed a fluid collection in the antecubital fossa incision.  The fistula has not been functional and has limited flow through it.  She has been dialyzing through a right tunneled dialysis catheter.  A  simple incision and drainage and closure was performed in the Cath Lab on 10/27/2022.  The cavity refilled with fluid on 10/29/2022 and she noticed bleeding.  She presented to care today because of persistent swelling and bleeding.  She has only had mild saturation of bandages which appeared to have been placed several days ago.   Hospital Course:  Admitted for surgical management of hematoma about left arm arteriovenous fistula. Unremarkable I&D. Patient discharged POD#1 in good condition.  Consults:  Treatment Team:  Leonie Douglas, MD Cephus Shelling, MD  Significant Diagnostic Studies: CBC    Component Value Date/Time   WBC 7.5 11/01/2022 1657   RBC 3.21 (L) 11/01/2022 1657   HGB 9.2 (L) 11/01/2022 1657   HGB 12.0 02/03/2022 1236   HCT 29.5 (L) 11/01/2022 1657   HCT 37.0 02/03/2022 1236   PLT 200 11/01/2022 1657   PLT 269 02/03/2022 1236   MCV 91.9 11/01/2022 1657   MCV 87 02/03/2022 1236   MCH 28.7 11/01/2022 1657   MCHC 31.2 11/01/2022 1657   RDW 16.4 (H) 11/01/2022 1657   RDW 17.8 (H) 02/03/2022 1236   LYMPHSABS 1.6 10/30/2022 1814   MONOABS 0.6 10/30/2022 1814   EOSABS 0.9 (H) 10/30/2022 1814   BASOSABS 0.1 10/30/2022 1814    BMET    Component Value Date/Time   NA 134 (L) 11/01/2022 1657   NA 140 03/19/2022 1621   K 4.7 11/01/2022 1657   CL 93 (L) 11/01/2022 1657   CO2 26 11/01/2022 1657   GLUCOSE 113 (H) 11/01/2022 1657   BUN  64 (H) 11/01/2022 1657   BUN 23 03/19/2022 1621   CREATININE 11.81 (H) 11/01/2022 1657   CALCIUM 8.6 (L) 11/01/2022 1657   GFRNONAA 3 (L) 11/01/2022 1657   GFRAA 4 (L) 01/18/2020 0714    COAG estimated creatinine clearance is 4.3 mL/min (A) (by C-G formula based on SCr of 11.81 mg/dL (H)).  No results found for: "PTT"  Disposition:  Discharge to :Home Discharge Instructions     Call MD for:  redness, tenderness, or signs of infection (pain, swelling, bleeding, redness, odor or green/yellow discharge around incision site)    Complete by: As directed    Call MD for:  severe or increased pain, loss or decreased feeling  in affected limb(s)   Complete by: As directed    Call MD for:  temperature >100.5   Complete by: As directed    Resume previous diet   Complete by: As directed      Verbal and written Discharge instructions given to the patient. Wound care per Discharge AVS  Rande Brunt. Lenell Antu, MD [facility] Phone Number: [phone] 11/14/2022 4:39 PM

## 2022-11-17 ENCOUNTER — Ambulatory Visit: Payer: Medicare Other

## 2022-11-17 DIAGNOSIS — D631 Anemia in chronic kidney disease: Secondary | ICD-10-CM | POA: Diagnosis not present

## 2022-11-17 DIAGNOSIS — N186 End stage renal disease: Secondary | ICD-10-CM | POA: Diagnosis not present

## 2022-11-17 DIAGNOSIS — N2581 Secondary hyperparathyroidism of renal origin: Secondary | ICD-10-CM | POA: Diagnosis not present

## 2022-11-17 DIAGNOSIS — Z992 Dependence on renal dialysis: Secondary | ICD-10-CM | POA: Diagnosis not present

## 2022-11-17 NOTE — Progress Notes (Signed)
Care Management & Coordination Services Pharmacy Note  11/17/2022 Name:  Jane [name] MRN:  [id number] DOB:  1946-04-07  Summary: BP continues to fluctuate but denies any low BP since decreasing hydralazine Denies any signs of heart attack/stroke/clot and denies any afib symptoms  Recommendations/Changes made from today's visit: -Continue to check BP daily as instructed by cardio -Counseled on signs of afib and clots  Follow up plan: BP in 3 months Pharmacist visit in 5 months   Subjective: Jane [name] is an 77 y.o. year old female who is a primary patient of [place], Timoteo Expose, MD.  The care coordination team was consulted for assistance with disease management and care coordination needs.    Engaged with patient by telephone for follow up visit.  Recent office visits: 08/21/2022 Kriste Basque DO - Medicare annual wellness visit, subsequent    08/07/2022 Abbe Amsterdam MD - Patient was seen for thrush and additional concerns. Started magic mouthwash and Fluconazole. Discontinued Ethyl Chloride.   07/23/2022 Betty [place] MD - Patient was seen for hypertension with renal disease and additional concerns. No medication changes.   Recent consult visits: 11/13/22 Yates Decamp, MD (Cardio) - For afib, stop tramadol and oxycodone  09/26/2022 Claudette Laws MD (Phys Med and Rehab) - Patient was seen for Spondylosis without myelopathy or radiculopathy, lumbar region and an additional concern. Started Tramadol 50 mg twice daily.    10/08/2022 Corrina Baglia PA-C (Vascular) - Patient was seen for ESRD on hemodialysis. Discontinued Methylprednisolone and Tramadol.   09/17/2022 Lemar Livings MD (vascular) - Patient was seen for ESRD. No medication changes.   09/04/2022 Claudette Laws MD (physical med & rehab) - Patient was seen for spondylosis without myelopathy or radiculopathy, lumbar region. No medication changes.   08/22/2022 Clinton Gallant PA-C (vascular) - Patient was seen for ESRD (end stage  renal disease). No medication changes.    08/14/2022 Yates Decamp (cardiology) - Patient was seen for Presence of Watchman left atrial appendage closure device and an additional concern. Discontinued Nystatin and Tramadol.   08/08/2022 Samantha Rhyne PA-C (vascular) - Patient was seen for ESRD (end stage renal disease). No medication changes.   07/24/2022 Claudette Laws MD (rehab) - Patient was  Hospital visits: 10/27/22 [facility], LOS 4 hours, For Upper Extremity Venography, No med changes  09/09/22 [facility], LOS 6 hours, for brachiobasilic arteriovenous fistula creation, START Tramadol  07/26/22 [facility], LOS 1 day, for insertaion of tunneled dialysis catheter, START Tramadol, Augmentin   Objective:  Lab Results  Component Value Date   CREATININE 11.81 (H) 11/01/2022   BUN 64 (H) 11/01/2022   GFR 2.96 (LL) 09/19/2021   EGFR 7 (L) 03/19/2022   GFRNONAA 3 (L) 11/01/2022   GFRAA 4 (L) 01/18/2020   NA 134 (L) 11/01/2022   K 4.7 11/01/2022   CALCIUM 8.6 (L) 11/01/2022   CO2 26 11/01/2022   GLUCOSE 113 (H) 11/01/2022    Lab Results  Component Value Date/Time   GFR 2.96 (LL) 09/19/2021 02:30 PM   GFR 4.82 (LL) 11/20/2020 10:25 AM    Last diabetic Eye exam: No results found for: "HMDIABEYEEXA"  Last diabetic Foot exam: No results found for: "HMDIABFOOTEX"   Lab Results  Component Value Date   CHOL 198 10/06/2019   HDL 53 10/06/2019   LDLCALC 135 (H) 10/06/2019   TRIG 51 10/06/2019   CHOLHDL 3.7 10/06/2019       Latest Ref Rng & Units 11/01/2022    4:57 PM 10/30/2022  7:55 PM 07/04/2022    7:58 PM  Hepatic Function  Total Protein 6.5 - 8.1 g/dL  6.9  7.3   Albumin 3.5 - 5.0 g/dL 2.9  3.3  3.4   AST 15 - 41 U/L  21  23   ALT 0 - 44 U/L  16  15   Alk Phosphatase 38 - 126 U/L  158  231   Total Bilirubin 0.3 - 1.2 mg/dL  0.5  0.4     Lab Results  Component Value Date/Time   TSH 1.36 11/20/2020 10:25 AM   TSH 0.351 01/13/2020 07:33 AM    TSH 1.129 10/06/2019 03:10 PM   TSH 0.94 09/22/2018 10:32 AM       Latest Ref Rng & Units 11/01/2022    4:57 PM 10/31/2022    8:14 AM 10/30/2022    7:55 PM  CBC  WBC 4.0 - 10.5 K/uL 7.5   5.4   Hemoglobin 12.0 - 15.0 g/dL 9.2  81.1  91.4   Hematocrit 36.0 - 46.0 % 29.5  31.0  32.5   Platelets 150 - 400 K/uL 200   201     No results found for: "VD25OH", "VITAMINB12"  Clinical ASCVD: Yes  The ASCVD Risk score (Arnett DK, et al., 2019) failed to calculate for the following reasons:   Cannot find a previous HDL lab   Cannot find a previous total cholesterol lab         10/16/2022   11:48 AM 09/04/2022    9:57 AM 08/21/2022   11:37 AM  Depression screen PHQ 2/9  Decreased Interest 0 0 1  Down, Depressed, Hopeless 0 0 3  PHQ - 2 Score 0 0 4  Altered sleeping   1  Tired, decreased energy   2  Change in appetite   3  Feeling bad or failure about yourself    0  Trouble concentrating   0  Moving slowly or fidgety/restless   0  Suicidal thoughts   0  PHQ-9 Score   10  Difficult doing work/chores   Somewhat difficult     Social History   Tobacco Use  Smoking Status Never   Passive exposure: Never  Smokeless Tobacco Never   BP Readings from Last 3 Encounters:  11/13/22 (!) 171/68  11/06/22 (!) 172/54  11/04/22 (!) 156/68   Pulse Readings from Last 3 Encounters:  11/13/22 (!) 56  11/06/22 87  11/04/22 72   Wt Readings from Last 3 Encounters:  11/13/22 196 lb 12.8 oz (89.3 kg)  11/06/22 193 lb 3.2 oz (87.6 kg)  11/04/22 193 lb 12.8 oz (87.9 kg)   BMI Readings from Last 3 Encounters:  11/13/22 32.75 kg/m  11/06/22 32.15 kg/m  11/04/22 32.25 kg/m    Allergies  Allergen Reactions   Latex Rash   Penicillins Other (See Comments)    Yeast infection / Childhood / Rash   Sulfa Antibiotics Rash   Tape Other (See Comments)    Plastic, silicone, and paper tape causes bruising and pulls off skin. Cloth tape works fine    Medications Reviewed Today     Reviewed  by Yates Decamp, MD (Physician) on 11/13/22 at 1517  Med List Status: <None>   Medication Order Taking? Sig Documenting Provider Last Dose Status Informant  0.9 %  sodium chloride infusion [id number]   Maeola Harman, MD  Active   atorvastatin (LIPITOR) 10 MG tablet [id number] Yes Take 1 tablet (10 mg total) by mouth daily. Yates Decamp,  MD Taking Active Self  B Complex-C-Zn-Folic Acid (DIALYVITE 800-ZINC 15) 0.8 MG TABS [id number] Yes Take 1 tablet by mouth daily. [provider] Taking Active   betamethasone acetate-betamethasone sodium phosphate (CELESTONE) injection 12 mg [id number]   Erick Colace, MD  Active   carvedilol (COREG) 12.5 MG tablet [id number] Yes Take 1 tablet (12.5 mg total) by mouth 2 (two) times daily with a meal. [place], [name], MD Taking Active Self  cinacalcet (SENSIPAR) 30 MG tablet [id number] Yes Take 30 mg by mouth every Monday, Wednesday, and Friday with hemodialysis. Post dialysis [provider] Taking Active Self           Med Note Lenoria Farrier   Fri Sep 05, 2022  3:53 PM) Given at dialysis  diltiazem (CARDIZEM) 60 MG tablet [id number] Yes Take 1 tablet (60 mg total) by mouth 3 (three) times daily as needed (Atrial fibrillation). Yates Decamp, MD Taking Active Self  famotidine (PEPCID) 20 MG tablet [id number] Yes Take 1 tablet (20 mg total) by mouth daily. Tyler Pita, MD Taking Active   folic acid-vitamin b complex-vitamin c-selenium-zinc (DIALYVITE) 3 MG TABS tablet [id number] Yes Take 1 tablet by mouth at bedtime. [provider] Taking Active Self  hydrALAZINE (APRESOLINE) 25 MG tablet [id number] Yes Take 0.5 tablets (12.5 mg total) by mouth in the morning and at bedtime. [place], [name], MD Taking Active   lansoprazole (PREVACID) 30 MG capsule [id number] Yes TAKE ONE CAPSULE BY MOUTH TWICE DAILY  Patient taking differently: Take 30 mg by mouth daily.   Arnaldo Natal, NP Taking Active Self  lidocaine (XYLOCAINE) 1  % (with pres) injection 4 mL [id number]   Erick Colace, MD  Active   LINZESS 72 MCG capsule [id number] Yes TAKE ONE CAPSULE BY MOUTH BEFORE BREAKFAST  Patient taking differently: Take 72 mcg by mouth every other day.   Arnaldo Natal, NP Taking Active Self  Methoxy PEG-Epoetin Beta [facility] IJ) [id number] Yes Inject as directed. [provider] Taking Active Self           Med Note Lenoria Farrier   Fri Sep 05, 2022  3:55 PM) Given at dialysis  sevelamer carbonate (RENVELA) 800 MG tablet [id number] Yes Take 1,600 mg by mouth 2 (two) times daily with a meal. [provider] Taking Active Self  sodium chloride flush (NS) 0.9 % injection 3 mL [id number]   Maeola Harman, MD  Active   Med List Note Salvatore Marvel, CPhT 06/25/16 2051): Dialysis days: Mon/Wed/Fri at: [facility]- [address] [place], [place], [place] [address]  [phone]            SDOH:  (Social Determinants of Health) assessments and interventions performed: Yes SDOH Interventions    Flowsheet Row Telephone from 08/21/2021 in [facility] Community Care Coordination Chronic Care Management from 08/13/2021 in [facility] [place] Office Visit from 10/23/2020 in [facility] [place] Clinical Support from 07/28/2019 in [facility] [place] Office Visit from 09/22/2018 in [facility] [place]  SDOH Interventions       Food Insecurity Interventions [id number] Referral -- -- -- --  Transportation Interventions [id number] Referral  [Has had issues using public transportation] Other (Comment)  [getting her set up with delivery pharmacy] -- -- --  Depression Interventions/Treatment  -- -- Currently on Treatment Counseling  [PCP appointment scheduled] Medication  Financial Strain Interventions --  [Spray for  her port access is 40.00 over the counter Ethyl Choride  Spray mist  monthly . OTC vitamin and stomache medicine is another 25.00 out of pocket and also need to join aarp for 16.00 yearly fee if she will qualify to get 100 % paid for oxygen] Intervention Not Indicated -- -- --       Medication Assistance: None required.  Patient affirms current coverage meets needs.  Medication Access: Within the past 30 days, how often has patient missed a dose of medication? None  Is a pillbox or other method used to improve adherence? Yes  Factors that may affect medication adherence? no barriers identified Are meds synced by current pharmacy? Yes  Are meds delivered by current pharmacy? Yes  Does patient experience delays in picking up medications due to transportation concerns? No   [facility] Reviewed: Is patient disadvantaged to use [facility]?: No  Current Rx insurance plan: Medicare Name and location of Current pharmacy:  [facility] - [place], [place] - [place] [address] [place] [address] Phone: [phone] Fax: [phone]  [facility] services reviewed with patient today?: No  Patient requests to transfer care to [facility]?: No  Reason patient declined to change pharmacies: Patient is already actively enrolled with [facility]  Compliance/Adherence/Medication fill history: Care Gaps: AWV - completed 08/21/2022 Last BP - 171/68 on 11/13/2022 Shingrix - never done Covid - overdue  Star-Rating Drugs: Atorvastatin 10 mg - last filled 11/05/2022 90 DS at [facility]   Assessment/Plan   Hypertension (BP goal <140/90) -Not ideally controlled -Current treatment: Carvedilol 12.5mg  BID Appropriate, Effective, Safe, Accessible Hydralazine 25mg  1/2 tab BID Appropriate, Query effective -Medications previously tried: Amlodipine, Losartan, Metoprolol -Current home readings: Not able to check at home currently due to cuts in both her arms post surgery, is  checking on lower arm. Does report BP was normal today following dialysis -Current dietary habits: Mindful of salt intake -Current exercise habits: Not discussed -Denies hypotensive/hypertensive symptoms -Educated on BP goals and benefits of medications for prevention of heart attack, stroke and kidney damage; Daily salt intake goal < 2300 mg; Importance of home blood pressure monitoring; Proper BP monitoring technique; -Counseled to monitor BP at home daily, document, and provide log at future appointments -Recommended to continue current medication  Atrial Fibrillation (Goal: prevent stroke and major bleeding) -Not ideally controlled -CHADSVASC: 5 -Current treatment: Diltiazem 60mg  TID prn a-fib Carvedilol 12.5mg  BID Anticoagulation: None -Medications previously tried: Amiodarone -Hx of GI Bleed - limits use of anticoag -Home BP and HR readings: see above  -Counseled on increased risk of stroke due to Afib and benefits of anticoagulation for stroke prevention; signs of clot -Recommended to continue current medication  Chronic Kidney Disease Stage 5/ESRD  -All medications assessed for renal dosing and appropriateness in chronic kidney disease.   Sherrill Raring Clinical Pharmacist [phone]

## 2022-11-19 ENCOUNTER — Ambulatory Visit (INDEPENDENT_AMBULATORY_CARE_PROVIDER_SITE_OTHER): Payer: Medicare Other | Admitting: Physician Assistant

## 2022-11-19 VITALS — BP 148/57 | HR 62 | Temp 98.0°F | Resp 20 | Ht 65.0 in | Wt 197.0 lb

## 2022-11-19 DIAGNOSIS — N186 End stage renal disease: Secondary | ICD-10-CM

## 2022-11-19 DIAGNOSIS — I158 Other secondary hypertension: Secondary | ICD-10-CM | POA: Diagnosis not present

## 2022-11-19 DIAGNOSIS — Z992 Dependence on renal dialysis: Secondary | ICD-10-CM

## 2022-11-19 DIAGNOSIS — D631 Anemia in chronic kidney disease: Secondary | ICD-10-CM | POA: Diagnosis not present

## 2022-11-19 DIAGNOSIS — N2581 Secondary hyperparathyroidism of renal origin: Secondary | ICD-10-CM | POA: Diagnosis not present

## 2022-11-20 ENCOUNTER — Encounter: Payer: Self-pay | Admitting: Physician Assistant

## 2022-11-20 NOTE — Progress Notes (Signed)
POST OPERATIVE OFFICE NOTE    CC:  F/u for surgery  HPI:  This is a 77 y.o. female who is s/p    On 07/26/2022, she had a right arm RC AVF ligation and excision and TDC placement by Dr. Karin Lieu. On 09/09/2022, she had left 1st stage BVT by Dr. Randie Heinz. This was noted to be occluded at time of procedure on 4/12.   On 10/27/2022, she underwent venography and subseqeunt open drainage of left arm hematoma by Dr. Randie Heinz.    s/p I&D left arm hematoma and evacuation on 10/31/2022 by Dr. Chestine Spore.    Pt returns today for follow up. She has not had drainage over the last few days.  The left UE is soft without recurrent or new hematoma.  She has a working [facility] .  The pt is on dialysis M/W/F at [facility] [address] location.    Allergies  Allergen Reactions   Latex Rash   Penicillins Other (See Comments)    Yeast infection / Childhood / Rash   Sulfa Antibiotics Rash   Tape Other (See Comments)    Plastic, silicone, and paper tape causes bruising and pulls off skin. Cloth tape works fine    Current Outpatient Medications  Medication Sig Dispense Refill   atorvastatin (LIPITOR) 10 MG tablet Take 1 tablet (10 mg total) by mouth daily. 90 tablet 1   B Complex-C-Zn-Folic Acid (DIALYVITE 800-ZINC 15) 0.8 MG TABS Take 1 tablet by mouth daily.     carvedilol (COREG) 12.5 MG tablet Take 1 tablet (12.5 mg total) by mouth 2 (two) times daily with a meal. 180 tablet 1   cinacalcet (SENSIPAR) 30 MG tablet Take 30 mg by mouth every Monday, Wednesday, and Friday with hemodialysis. Post dialysis     diltiazem (CARDIZEM) 60 MG tablet Take 1 tablet (60 mg total) by mouth 3 (three) times daily as needed (Atrial fibrillation). 30 tablet 1   famotidine (PEPCID) 20 MG tablet Take 1 tablet (20 mg total) by mouth daily. 30 tablet 1   hydrALAZINE (APRESOLINE) 25 MG tablet Take 0.5 tablets (12.5 mg total) by mouth in the morning and at bedtime. 45 tablet 3   lansoprazole (PREVACID) 30 MG capsule TAKE ONE CAPSULE BY MOUTH TWICE  DAILY (Patient taking differently: Take 30 mg by mouth daily.) 60 capsule 2   LINZESS 72 MCG capsule TAKE ONE CAPSULE BY MOUTH BEFORE BREAKFAST (Patient taking differently: Take 72 mcg by mouth every other day.) 30 capsule 2   Methoxy PEG-Epoetin Beta (MIRCERA IJ) Inject as directed.     sevelamer carbonate (RENVELA) 800 MG tablet Take 1,600 mg by mouth 2 (two) times daily with a meal.     Current Facility-Administered Medications  Medication Dose Route Frequency Provider Last Rate Last Admin   0.9 %  sodium chloride infusion  250 mL Intravenous PRN Maeola Harman, MD       betamethasone acetate-betamethasone sodium phosphate (CELESTONE) injection 12 mg  12 mg Intramuscular Once Kirsteins, Victorino Sparrow, MD       lidocaine (XYLOCAINE) 1 % (with pres) injection 4 mL  4 mL Other Once Kirsteins, Victorino Sparrow, MD       sodium chloride flush (NS) 0.9 % injection 3 mL  3 mL Intravenous Q12H Maeola Harman, MD         ROS:  See HPI  Physical Exam:    Incision:  well healed, no erythema or edema Extremities:  Compartment of the left UE are soft Neuro: sensation  intact grossly equal     Assessment/Plan:   ESRD with left brachiobasilic AV fistula creation known to be thrombosed.  followed by hematoma occurrence.  She then presented back to the hospital with recurrent hematoma after I&D.   She denies symptoms of steal.  Her incision is now healed.    She has a working [facility].  We will plan right UE AV fistula verse graft in 4 weeks.  She will call with concerns.  Activity as tolerates left UE.  We want he left arm to be fully recovered prior to new access.    Clinton Gallant, [facility] [phone]   Clinic MD:  Randie Heinz

## 2022-11-21 DIAGNOSIS — Z992 Dependence on renal dialysis: Secondary | ICD-10-CM | POA: Diagnosis not present

## 2022-11-21 DIAGNOSIS — N2581 Secondary hyperparathyroidism of renal origin: Secondary | ICD-10-CM | POA: Diagnosis not present

## 2022-11-21 DIAGNOSIS — D631 Anemia in chronic kidney disease: Secondary | ICD-10-CM | POA: Diagnosis not present

## 2022-11-21 DIAGNOSIS — N186 End stage renal disease: Secondary | ICD-10-CM | POA: Diagnosis not present

## 2022-11-24 ENCOUNTER — Other Ambulatory Visit: Payer: Self-pay

## 2022-11-24 DIAGNOSIS — N2581 Secondary hyperparathyroidism of renal origin: Secondary | ICD-10-CM | POA: Diagnosis not present

## 2022-11-24 DIAGNOSIS — I10 Essential (primary) hypertension: Secondary | ICD-10-CM | POA: Diagnosis not present

## 2022-11-24 DIAGNOSIS — Z992 Dependence on renal dialysis: Secondary | ICD-10-CM | POA: Diagnosis not present

## 2022-11-24 DIAGNOSIS — N186 End stage renal disease: Secondary | ICD-10-CM | POA: Diagnosis not present

## 2022-11-24 DIAGNOSIS — D631 Anemia in chronic kidney disease: Secondary | ICD-10-CM | POA: Diagnosis not present

## 2022-11-25 ENCOUNTER — Other Ambulatory Visit: Payer: Self-pay | Admitting: Nurse Practitioner

## 2022-11-25 ENCOUNTER — Other Ambulatory Visit: Payer: Self-pay | Admitting: Cardiology

## 2022-11-25 DIAGNOSIS — I6523 Occlusion and stenosis of bilateral carotid arteries: Secondary | ICD-10-CM

## 2022-11-25 DIAGNOSIS — K219 Gastro-esophageal reflux disease without esophagitis: Secondary | ICD-10-CM

## 2022-11-25 DIAGNOSIS — K5900 Constipation, unspecified: Secondary | ICD-10-CM

## 2022-11-25 NOTE — Telephone Encounter (Signed)
Please advise 

## 2022-11-25 NOTE — Progress Notes (Unsigned)
HPI: Jane [name] is a 77 y.o. female with PMHx significant for ESRD on dialysis,HTN,OA,chronic pain,and GERD here today to follow on recent hospitalization, vascular, upper extremity venography.  She is c/o persistent cough, shortness of breath, wheezing, and lack of energy for three weeks since her recent hospital discharge, 11/01/22. She believes her symptoms worsened after receiving anesthesia for vascular procedure.  She reports initially having yellowish sputum that has since become clear.  She has tested negative for COVID-19 twice.  She also reports sore throat, loss of taste, and episode of oral thrush which she attributed to previous antibiotic use and resolved. Sore throat is not longer present.  She suspects her respiratory symptoms might be related to her asthma and allergies, exacerbated by temperature fluctuations and the cold environment at the dialysis center. She is not on any inhaler. Negative for orthopnea and PND.  She is having episodes of heartburn and acid reflux, follows with gastroenterologist and currently on Prevacid 30 mg daily and Pepcid 20 mg bid. She reports nausea but denies abdominal pain, vomiting, or changes in bowel habits.  States that she has been receiving iron infusion, which can be administered every seven days.  She has been advised by her nephrologist to increase her protein intake to address her energy levels, so she has been trying to eat despite her nausea. She can tolerate certain foods like sweets and chicken noodle soup without salt. She drinks Boost nutritional drinks, and sometimes mixes them with almond milk to improve the taste.  She reports having blood work recently and "every thing" was stable.  She also mentioned ongoing issues with transportation to her dialysis appointments, exacerbated by staffing shortages at the dialysis center. This situation has caused some stress but in general she feels "happy." States that her nephrologist  and department of transportation are trying to fix the problem.  Review of Systems  Constitutional:  Negative for activity change, chills and fever.  HENT:  Positive for postnasal drip and rhinorrhea.   Eyes:  Negative for discharge and itching.  Cardiovascular:  Negative for chest pain, palpitations and leg swelling.  Endocrine: Negative for cold intolerance and heat intolerance.  Skin:  Negative for rash.  Allergic/Immunologic: Positive for environmental allergies.  Neurological:  Negative for syncope, weakness and headaches.  See other pertinent positives and negatives in HPI.  Current Outpatient Medications on File Prior to Visit  Medication Sig Dispense Refill   atorvastatin (LIPITOR) 10 MG tablet TAKE ONE TABLET BY MOUTH ONCE DAILY 90 tablet 1   B Complex-C-Zn-Folic Acid (DIALYVITE 800-ZINC 15) 0.8 MG TABS Take 1 tablet by mouth daily.     carvedilol (COREG) 12.5 MG tablet Take 1 tablet (12.5 mg total) by mouth 2 (two) times daily with a meal. 180 tablet 1   cinacalcet (SENSIPAR) 30 MG tablet Take 30 mg by mouth every Monday, Wednesday, and Friday with hemodialysis. Post dialysis     diltiazem (CARDIZEM) 60 MG tablet Take 1 tablet (60 mg total) by mouth 3 (three) times daily as needed (Atrial fibrillation). 30 tablet 1   famotidine (PEPCID) 20 MG tablet Take 1 tablet (20 mg total) by mouth daily. 30 tablet 1   hydrALAZINE (APRESOLINE) 25 MG tablet Take 0.5 tablets (12.5 mg total) by mouth in the morning and at bedtime. 45 tablet 3   lansoprazole (PREVACID) 30 MG capsule Take 1 capsule (30 mg total) by mouth daily. 90 capsule 1   LINZESS 72 MCG capsule TAKE ONE CAPSULE BY MOUTH BEFORE BREAKFAST  30 capsule 2   Methoxy PEG-Epoetin Beta (MIRCERA IJ) Inject as directed.     sevelamer carbonate (RENVELA) 800 MG tablet Take 1,600 mg by mouth 2 (two) times daily with a meal.     Current Facility-Administered Medications on File Prior to Visit  Medication Dose Route Frequency Provider Last  Rate Last Admin   0.9 %  sodium chloride infusion  250 mL Intravenous PRN Maeola Harman, MD       betamethasone acetate-betamethasone sodium phosphate (CELESTONE) injection 12 mg  12 mg Intramuscular Once Kirsteins, Victorino Sparrow, MD       lidocaine (XYLOCAINE) 1 % (with pres) injection 4 mL  4 mL Other Once Kirsteins, Victorino Sparrow, MD       sodium chloride flush (NS) 0.9 % injection 3 mL  3 mL Intravenous Q12H Maeola Harman, MD       Past Medical History:  Diagnosis Date   Acid reflux    Anemia of chronic disease    Arthritis    Asthma    AVM (arteriovenous malformation)    Bilateral carotid bruits    Diverticulitis    Duodenal ulcer    Dysrhythmia    a-fib   ESRD (end stage renal disease) (HCC)    [facility]   Hemorrhoids    History of blood transfusion    Hypertension    Malaise and fatigue    Orthostatic hypotension    PAF (paroxysmal atrial fibrillation) (HCC)    Presence of Watchman left atrial appendage closure device 02/06/2022   Watchman FLX 27mm with Dr. Excell Seltzer   Sleep apnea    does not need cpap per patient   Syncope    Tubulovillous adenoma of colon    Allergies  Allergen Reactions   Latex Rash   Penicillins Other (See Comments)    Yeast infection / Childhood / Rash   Sulfa Antibiotics Rash   Tape Other (See Comments)    Plastic, silicone, and paper tape causes bruising and pulls off skin. Cloth tape works fine    Social History   Socioeconomic History   Marital status: Divorced    Spouse name: Not on file   Number of children: 4   Years of education: 14   Highest education level: Associate degree: occupational, Scientist, product/process development, or vocational program  Occupational History   Occupation: Retired  Tobacco Use   Smoking status: Never    Passive exposure: Never   Smokeless tobacco: Never  Vaping Use   Vaping Use: Never used  Substance and Sexual Activity   Alcohol use: No   Drug use: No   Sexual activity: Never    Birth  control/protection: Post-menopausal  Other Topics Concern   Not on file  Social History Narrative   HH 1   Divorced   Outpatient dialysis Mon, Wed, Fri   4 children: 1 daughter locally is an Engineer, structural and 3 sons in [place]   Social Determinants of Health   Financial Resource Strain: High Risk (07/09/2022)   Overall Financial Resource Strain (CARDIA)    Difficulty of Paying Living Expenses: Hard  Food Insecurity: Food Insecurity Present (10/30/2022)   Hunger Vital Sign    Worried About Running Out of Food in the Last Year: Sometimes true    Ran Out of Food in the Last Year: Sometimes true  Transportation Needs: No Transportation Needs (11/17/2022)   PRAPARE - Administrator, Civil Service (Medical): No    Lack of Transportation (Non-Medical):  No  Physical Activity: Inactive (08/06/2021)   Exercise Vital Sign    Days of Exercise per Week: 0 days    Minutes of Exercise per Session: 0 min  Stress: Stress Concern Present (08/21/2021)   [employer] of Occupational Health - Occupational Stress Questionnaire    Feeling of Stress : Very much  Social Connections: Moderately Isolated (07/31/2020)   Social Connection and Isolation Panel [NHANES]    Frequency of Communication with Friends and Family: More than three times a week    Frequency of Social Gatherings with Friends and Family: Never    Attends Religious Services: More than 4 times per year    Active Member of Clubs or Organizations: No    Attends Banker Meetings: Never    Marital Status: Divorced   Vitals:   11/26/22 1415  BP: 130/80  Pulse: 75  Resp: 16  Temp: 98.9 F (37.2 C)  SpO2: 96%   Body mass index is 32.49 kg/m.  Physical Exam Vitals and nursing note reviewed.  Constitutional:      General: She is not in acute distress.    Appearance: She is well-developed.  HENT:     Head: Normocephalic and atraumatic.     Mouth/Throat:     Mouth: Mucous membranes are moist.  Eyes:      Conjunctiva/sclera: Conjunctivae normal.  Cardiovascular:     Rate and Rhythm: Normal rate and regular rhythm.     Pulses:          Dorsalis pedis pulses are 2+ on the right side and 2+ on the left side.     Heart sounds: Murmur (SEM II/VI RUSB) heard.  Pulmonary:     Effort: Pulmonary effort is normal. No respiratory distress.     Breath sounds: Normal breath sounds.     Comments: Episodes of productive cough during the visit. Abdominal:     General: Bowel sounds are normal.     Palpations: Abdomen is soft.     Tenderness: There is no abdominal tenderness.  Musculoskeletal:     Right lower leg: No edema.     Left lower leg: No edema.  Skin:    General: Skin is warm.     Findings: No erythema or rash.  Neurological:     General: No focal deficit present.     Mental Status: She is alert and oriented to person, place, and time.     Cranial Nerves: No cranial nerve deficit.     Comments: Antalgic gait, not assisted today.  Psychiatric:        Mood and Affect: Mood and affect normal.   ASSESSMENT AND PLAN:  Ms.Matisse was seen today for hospitalization follow-up.  Diagnoses and all orders for this visit:  Cough, unspecified type We discussed possible etiologies. Some of her chronic medical conditions can be contributing factors. Lung auscultation negative today. With associated wheezing and DOE and hx of asthma.  GERD still symptomatic.  Benzonatate for symptoms management. Further recommendations according to CXR result.  -     DG Chest 2 View; Future -     Benzonatate; Take 2 capsules (200 mg total) by mouth 2 (two) times daily as needed for up to 10 days.  Dispense: 20 capsule; Refill: 0  Asthma, intermittent, uncomplicated Assessment & Plan: Recommend Advair 250-50 mcg bid, some side effects discussed, instructed to swish 2-3 times after use. Albuterol inh 2 puff every 6 hours for a week then as needed for wheezing or shortness of  breath.  Instructed about warning  signs. F/U in 2 months, before if needed.  Orders: -     Fluticasone-Salmeterol; Inhale 1 puff into the lungs 2 (two) times daily.  Dispense: 1 each; Refill: 3 -     Albuterol Sulfate HFA; Inhale 2 puffs into the lungs every 6 (six) hours as needed for wheezing or shortness of breath.  Dispense: 8 g; Refill: 0  Gastroesophageal reflux disease without esophagitis Assessment & Plan: Problem is not well controlled. Follows with GI. She has been on other PPI's, including Protonix. She is on Prevacid 30 mg and Pepcid 20 mg bid. Continue GERD precautions.  Return in about 2 months (around 01/26/2023) for chronic problems.  Diann Bangerter G. [place], MD  [facility]. [place] office.

## 2022-11-26 ENCOUNTER — Encounter: Payer: Self-pay | Admitting: Family Medicine

## 2022-11-26 ENCOUNTER — Ambulatory Visit (INDEPENDENT_AMBULATORY_CARE_PROVIDER_SITE_OTHER): Payer: Medicare Other

## 2022-11-26 ENCOUNTER — Ambulatory Visit (INDEPENDENT_AMBULATORY_CARE_PROVIDER_SITE_OTHER): Payer: Medicare Other | Admitting: Family Medicine

## 2022-11-26 VITALS — BP 130/80 | HR 75 | Temp 98.9°F | Resp 16 | Ht 65.0 in | Wt 195.2 lb

## 2022-11-26 DIAGNOSIS — I4891 Unspecified atrial fibrillation: Secondary | ICD-10-CM | POA: Diagnosis not present

## 2022-11-26 DIAGNOSIS — D631 Anemia in chronic kidney disease: Secondary | ICD-10-CM | POA: Diagnosis not present

## 2022-11-26 DIAGNOSIS — R059 Cough, unspecified: Secondary | ICD-10-CM | POA: Diagnosis not present

## 2022-11-26 DIAGNOSIS — R0602 Shortness of breath: Secondary | ICD-10-CM | POA: Diagnosis not present

## 2022-11-26 DIAGNOSIS — J4520 Mild intermittent asthma, uncomplicated: Secondary | ICD-10-CM | POA: Insufficient documentation

## 2022-11-26 DIAGNOSIS — N186 End stage renal disease: Secondary | ICD-10-CM | POA: Diagnosis not present

## 2022-11-26 DIAGNOSIS — K219 Gastro-esophageal reflux disease without esophagitis: Secondary | ICD-10-CM | POA: Diagnosis not present

## 2022-11-26 DIAGNOSIS — N2581 Secondary hyperparathyroidism of renal origin: Secondary | ICD-10-CM | POA: Diagnosis not present

## 2022-11-26 DIAGNOSIS — R062 Wheezing: Secondary | ICD-10-CM | POA: Diagnosis not present

## 2022-11-26 DIAGNOSIS — Z992 Dependence on renal dialysis: Secondary | ICD-10-CM | POA: Diagnosis not present

## 2022-11-26 MED ORDER — FLUTICASONE-SALMETEROL 100-50 MCG/ACT IN AEPB
1.0000 | INHALATION_SPRAY | Freq: Two times a day (BID) | RESPIRATORY_TRACT | 3 refills | Status: DC
Start: 2022-11-26 — End: 2023-01-13

## 2022-11-26 MED ORDER — BENZONATATE 100 MG PO CAPS: 200.0000 mg | ORAL_CAPSULE | Freq: Two times a day (BID) | ORAL | 0 refills | Status: AC | PRN

## 2022-11-26 MED ORDER — ALBUTEROL SULFATE HFA 108 (90 BASE) MCG/ACT IN AERS
2.0000 | INHALATION_SPRAY | Freq: Four times a day (QID) | RESPIRATORY_TRACT | 0 refills | Status: DC | PRN
Start: 2022-11-26 — End: 2022-12-30

## 2022-11-26 NOTE — Patient Instructions (Addendum)
A few things to remember from today's visit:  Cough, unspecified type - Plan: DG Chest 2 View, benzonatate (TESSALON) 100 MG capsule  Asthma, intermittent, uncomplicated Albuterol inh 2 puff every 6 hours for a week then as needed for wheezing or shortness of breath.  Start Advair 2 times daily, swish 2-3 times after use.  If you need refills for medications you take chronically, please call your pharmacy. Do not use My Chart to request refills or for acute issues that need immediate attention. If you send a my chart message, it may take a few days to be addressed, specially if I am not in the office.  Please be sure medication list is accurate. If a new problem present, please set up appointment sooner than planned today.

## 2022-11-27 ENCOUNTER — Encounter: Payer: Self-pay | Admitting: Family Medicine

## 2022-11-27 ENCOUNTER — Telehealth: Payer: Self-pay

## 2022-11-27 NOTE — Assessment & Plan Note (Addendum)
Recommend Advair 250-50 mcg bid, some side effects discussed, instructed to swish 2-3 times after use. Albuterol inh 2 puff every 6 hours for a week then as needed for wheezing or shortness of breath.  Instructed about warning signs. F/U in 2 months, before if needed.

## 2022-11-27 NOTE — Assessment & Plan Note (Addendum)
Problem is not well controlled. Follows with GI. She has been on other PPI's, including Protonix. She is on Prevacid 30 mg and Pepcid 20 mg bid. Continue GERD precautions.

## 2022-11-27 NOTE — Progress Notes (Signed)
Care Management & Coordination Services Pharmacy Team  Reason for Encounter: Medication coordination and delivery  Contacted patient to discuss medications and coordinate delivery from [facility]. Unsuccessful outreach. Left voicemail for patient to return call. Multiple attempts  Cycle dispensing form sent to Delano Metz for review.   Last adherence delivery date:11/10/2022      Patient is due for next adherence delivery on: 12/09/2022  This delivery to include: Vials  30 Days  Atorvastatin 10 mg - 1 tablet once daily Linzess 72 mcg - 1 capsule before breakfast  Famotidine 20mg  - 1 tablet twice daily (epic has 20 mg not 40 mg) Dialyvite 800 with zinc - take 1 tablet every morning (d/c 11/19/22 with vascular) Carvedilol 12.5 mg - 1 tablet twice daily Lansoprazole 30 mg - take once daily  Hydralazine 25 mg - 1/2 tablet 2 times daily (in epic) and fill hx is 50 mg 1 tab bid ( with [facility])    Patient declined the following medications this month: Diltiazem 60 mg  - 1 tablet 3 times daily as needed for Afib    Delivery scheduled for 12/09/2022. Unable to speak with patient to confirm date.   Any concerns about your medications?   How often do you forget or accidentally miss a dose?   Do you use a pillbox?   Is patient in packaging No   Recent blood pressure readings are as follows:   Chart review: Recent office visits:  11/26/2022 Betty [place] MD - Patient was seen for cough and additional concerns. Started Albuterol HFA, Benzonatate and Advair.   Recent consult visits:  11/19/2022 Clinton Gallant PA-C (vascular) - Patient was seen for ESRD on hemodialysis. Discontinued Dialyvite.   Hospital visits:  None  Medications: Outpatient Encounter Medications as of 11/27/2022  Medication Sig Note   albuterol (VENTOLIN HFA) 108 (90 Base) MCG/ACT inhaler Inhale 2 puffs into the lungs every 6 (six) hours as needed for wheezing or shortness of breath.    atorvastatin  (LIPITOR) 10 MG tablet TAKE ONE TABLET BY MOUTH ONCE DAILY    B Complex-C-Zn-Folic Acid (DIALYVITE 800-ZINC 15) 0.8 MG TABS Take 1 tablet by mouth daily.    benzonatate (TESSALON) 100 MG capsule Take 2 capsules (200 mg total) by mouth 2 (two) times daily as needed for up to 10 days.    carvedilol (COREG) 12.5 MG tablet Take 1 tablet (12.5 mg total) by mouth 2 (two) times daily with a meal.    cinacalcet (SENSIPAR) 30 MG tablet Take 30 mg by mouth every Monday, Wednesday, and Friday with hemodialysis. Post dialysis 09/05/2022: Given at dialysis   diltiazem (CARDIZEM) 60 MG tablet Take 1 tablet (60 mg total) by mouth 3 (three) times daily as needed (Atrial fibrillation).    famotidine (PEPCID) 20 MG tablet Take 1 tablet (20 mg total) by mouth daily.    fluticasone-salmeterol (ADVAIR) 100-50 MCG/ACT AEPB Inhale 1 puff into the lungs 2 (two) times daily.    hydrALAZINE (APRESOLINE) 25 MG tablet Take 0.5 tablets (12.5 mg total) by mouth in the morning and at bedtime.    lansoprazole (PREVACID) 30 MG capsule Take 1 capsule (30 mg total) by mouth daily.    LINZESS 72 MCG capsule TAKE ONE CAPSULE BY MOUTH BEFORE BREAKFAST    Methoxy PEG-Epoetin Beta (MIRCERA IJ) Inject as directed. 09/05/2022: Given at dialysis   sevelamer carbonate (RENVELA) 800 MG tablet Take 1,600 mg by mouth 2 (two) times daily with a meal.    Facility-Administered Encounter Medications as  of 11/27/2022  Medication   0.9 %  sodium chloride infusion   betamethasone acetate-betamethasone sodium phosphate (CELESTONE) injection 12 mg   lidocaine (XYLOCAINE) 1 % (with pres) injection 4 mL   sodium chloride flush (NS) 0.9 % injection 3 mL   BP Readings from Last 3 Encounters:  11/26/22 130/80  11/19/22 (!) 148/57  11/13/22 (!) 171/68    Pulse Readings from Last 3 Encounters:  11/26/22 75  11/19/22 62  11/13/22 (!) 56    No results found for: "HGBA1C" Lab Results  Component Value Date   CREATININE 11.81 (H) 11/01/2022   BUN 64  (H) 11/01/2022   GFR 2.96 (LL) 09/19/2021   GFRNONAA 3 (L) 11/01/2022   GFRAA 4 (L) 01/18/2020   NA 134 (L) 11/01/2022   K 4.7 11/01/2022   CALCIUM 8.6 (L) 11/01/2022   CO2 26 11/01/2022   [name] CMA  Clinical Pharmacist Assistant [phone]

## 2022-11-28 DIAGNOSIS — D631 Anemia in chronic kidney disease: Secondary | ICD-10-CM | POA: Diagnosis not present

## 2022-11-28 DIAGNOSIS — N2581 Secondary hyperparathyroidism of renal origin: Secondary | ICD-10-CM | POA: Diagnosis not present

## 2022-11-28 DIAGNOSIS — N186 End stage renal disease: Secondary | ICD-10-CM | POA: Diagnosis not present

## 2022-11-28 DIAGNOSIS — Z992 Dependence on renal dialysis: Secondary | ICD-10-CM | POA: Diagnosis not present

## 2022-12-01 ENCOUNTER — Telehealth: Payer: Self-pay

## 2022-12-01 DIAGNOSIS — D631 Anemia in chronic kidney disease: Secondary | ICD-10-CM | POA: Diagnosis not present

## 2022-12-01 DIAGNOSIS — N2581 Secondary hyperparathyroidism of renal origin: Secondary | ICD-10-CM | POA: Diagnosis not present

## 2022-12-01 DIAGNOSIS — Z992 Dependence on renal dialysis: Secondary | ICD-10-CM | POA: Diagnosis not present

## 2022-12-01 DIAGNOSIS — N186 End stage renal disease: Secondary | ICD-10-CM | POA: Diagnosis not present

## 2022-12-01 NOTE — Telephone Encounter (Signed)
Pharmacy called in needing to extend Benzonatate Rx. Rx was originally sent in for only 20 capsules, but directions are to take 2 capsules bid. Okay given to give patient the remaining 20 capsules to complete the Rx.

## 2022-12-03 DIAGNOSIS — Z992 Dependence on renal dialysis: Secondary | ICD-10-CM | POA: Diagnosis not present

## 2022-12-03 DIAGNOSIS — D631 Anemia in chronic kidney disease: Secondary | ICD-10-CM | POA: Diagnosis not present

## 2022-12-03 DIAGNOSIS — N186 End stage renal disease: Secondary | ICD-10-CM | POA: Diagnosis not present

## 2022-12-03 DIAGNOSIS — N2581 Secondary hyperparathyroidism of renal origin: Secondary | ICD-10-CM | POA: Diagnosis not present

## 2022-12-04 ENCOUNTER — Other Ambulatory Visit: Payer: Self-pay | Admitting: Cardiology

## 2022-12-04 ENCOUNTER — Encounter: Payer: Self-pay | Admitting: Physical Medicine & Rehabilitation

## 2022-12-04 ENCOUNTER — Encounter: Payer: Medicare Other | Attending: Physical Medicine & Rehabilitation | Admitting: Physical Medicine & Rehabilitation

## 2022-12-04 VITALS — BP 175/72 | HR 67 | Ht 65.0 in | Wt 195.0 lb

## 2022-12-04 DIAGNOSIS — M47816 Spondylosis without myelopathy or radiculopathy, lumbar region: Secondary | ICD-10-CM | POA: Insufficient documentation

## 2022-12-04 DIAGNOSIS — K219 Gastro-esophageal reflux disease without esophagitis: Secondary | ICD-10-CM

## 2022-12-04 MED ORDER — LIDOCAINE HCL (PF) 2 % IJ SOLN
1.0000 mL | Freq: Once | INTRAMUSCULAR | Status: AC
  Administered 2022-12-04: 1 mL

## 2022-12-04 MED ORDER — BETAMETHASONE SOD PHOS & ACET 6 (3-3) MG/ML IJ SUSP
5.0000 mg | Freq: Once | INTRAMUSCULAR | Status: AC
Start: 2022-12-04 — End: 2022-12-04
  Administered 2022-12-04: 4.8 mg via INTRA_ARTICULAR

## 2022-12-04 MED ORDER — LIDOCAINE HCL 1 % IJ SOLN
4.0000 mL | Freq: Once | INTRAMUSCULAR | Status: AC
Start: 2022-12-04 — End: 2022-12-04
  Administered 2022-12-04: 4 mL

## 2022-12-04 NOTE — Progress Notes (Signed)
  PROCEDURE RECORD [facility]   Name: Jane [name] DOB:16-Apr-1946 MRN: [id number]  Date:12/04/2022  Physician: Claudette Laws, MD    Nurse/CMA: Demico Ploch RN  Allergies:  Allergies  Allergen Reactions   Latex Rash   Penicillins Other (See Comments)    Yeast infection / Childhood / Rash   Sulfa Antibiotics Rash   Tape Other (See Comments)    Plastic, silicone, and paper tape causes bruising and pulls off skin. Cloth tape works fine    Consent Signed: Yes.    Is patient diabetic? No.  CBG today?   Pregnant: No. LMP: No LMP recorded. Patient is postmenopausal. (age 18-55)  Anticoagulants: no Anti-inflammatory: no Antibiotics: no  Procedure: left lumbar level 5- sacral 1 facet injection  Position: Prone Start Time: 2:43  End Time: 2:48  Fluoro Time: 23  RN/CMA Haematologist RN    Time 156 251    BP 175/72 217/88    Pulse 67 81    Respirations 14 14    O2 Sat 96 96    S/S 6 6    Pain Level 8/10 0/10     D/C home with daughter, patient A & O X 3, D/C instructions reviewed, and sits independently.

## 2022-12-04 NOTE — Patient Instructions (Signed)
Facet joint injection L5-S1

## 2022-12-04 NOTE — Progress Notes (Signed)
Left L5 -S1 facet  intra articular injection   Indication left-sided low back pain status post L4-5 fusion  Informed consent was obtained after describing risks and benefits of the procedure with patient these include bleeding bruise infection as well as intolerance to medications injected.  The left L5-S1 facet was visualized using oblique views with the C arm inclined to sharpen endplates of L5 and S1. 25-gauge 1.5 inch needle was used anesthetize skin and subcu tissue with 1 cc 1% lidocaine.  Then a 22-gauge 3.5 inch spinal needle was then inserted under fluoroscopic guidance targeting the inferior aspect of the joint.  0.5 mL of Omnipaque 180 was injected demonstrating good joint outline dumbbell shaped contrast pattern followed by injection of 1 mL of 2% lidocaine. The patient tolerated procedure well Postprocedure instructions

## 2022-12-05 DIAGNOSIS — N2581 Secondary hyperparathyroidism of renal origin: Secondary | ICD-10-CM | POA: Diagnosis not present

## 2022-12-05 DIAGNOSIS — D631 Anemia in chronic kidney disease: Secondary | ICD-10-CM | POA: Diagnosis not present

## 2022-12-05 DIAGNOSIS — N186 End stage renal disease: Secondary | ICD-10-CM | POA: Diagnosis not present

## 2022-12-05 DIAGNOSIS — Z992 Dependence on renal dialysis: Secondary | ICD-10-CM | POA: Diagnosis not present

## 2022-12-08 ENCOUNTER — Telehealth: Payer: Self-pay

## 2022-12-08 DIAGNOSIS — N2581 Secondary hyperparathyroidism of renal origin: Secondary | ICD-10-CM | POA: Diagnosis not present

## 2022-12-08 DIAGNOSIS — Z992 Dependence on renal dialysis: Secondary | ICD-10-CM | POA: Diagnosis not present

## 2022-12-08 DIAGNOSIS — D631 Anemia in chronic kidney disease: Secondary | ICD-10-CM | POA: Diagnosis not present

## 2022-12-08 DIAGNOSIS — N186 End stage renal disease: Secondary | ICD-10-CM | POA: Diagnosis not present

## 2022-12-08 NOTE — Telephone Encounter (Signed)
Received a vm from patient stating that she needs to cancel surgery with Dr. Randie Heinz.   Attempted to reach patient back, but no answer. Left VM for patient to return call.

## 2022-12-09 NOTE — Telephone Encounter (Signed)
Spoke with patient and said she is concerned with scheduling surgery right now because of lack of staff at the dialysis center and she does not want to risk something happening to this new access. Patient requested to move date to August. Surgery rescheduled for 8/15. Instructions reviewed and she voiced understanding.

## 2022-12-10 DIAGNOSIS — N186 End stage renal disease: Secondary | ICD-10-CM | POA: Diagnosis not present

## 2022-12-10 DIAGNOSIS — N2581 Secondary hyperparathyroidism of renal origin: Secondary | ICD-10-CM | POA: Diagnosis not present

## 2022-12-10 DIAGNOSIS — D631 Anemia in chronic kidney disease: Secondary | ICD-10-CM | POA: Diagnosis not present

## 2022-12-10 DIAGNOSIS — Z992 Dependence on renal dialysis: Secondary | ICD-10-CM | POA: Diagnosis not present

## 2022-12-12 DIAGNOSIS — N2581 Secondary hyperparathyroidism of renal origin: Secondary | ICD-10-CM | POA: Diagnosis not present

## 2022-12-12 DIAGNOSIS — Z992 Dependence on renal dialysis: Secondary | ICD-10-CM | POA: Diagnosis not present

## 2022-12-12 DIAGNOSIS — D631 Anemia in chronic kidney disease: Secondary | ICD-10-CM | POA: Diagnosis not present

## 2022-12-12 DIAGNOSIS — N186 End stage renal disease: Secondary | ICD-10-CM | POA: Diagnosis not present

## 2022-12-15 DIAGNOSIS — N2581 Secondary hyperparathyroidism of renal origin: Secondary | ICD-10-CM | POA: Diagnosis not present

## 2022-12-15 DIAGNOSIS — Z992 Dependence on renal dialysis: Secondary | ICD-10-CM | POA: Diagnosis not present

## 2022-12-15 DIAGNOSIS — D631 Anemia in chronic kidney disease: Secondary | ICD-10-CM | POA: Diagnosis not present

## 2022-12-15 DIAGNOSIS — N186 End stage renal disease: Secondary | ICD-10-CM | POA: Diagnosis not present

## 2022-12-16 DIAGNOSIS — Z1231 Encounter for screening mammogram for malignant neoplasm of breast: Secondary | ICD-10-CM | POA: Diagnosis not present

## 2022-12-16 LAB — HM MAMMOGRAPHY

## 2022-12-17 ENCOUNTER — Encounter: Payer: Self-pay | Admitting: Family Medicine

## 2022-12-17 DIAGNOSIS — Z992 Dependence on renal dialysis: Secondary | ICD-10-CM | POA: Diagnosis not present

## 2022-12-17 DIAGNOSIS — D631 Anemia in chronic kidney disease: Secondary | ICD-10-CM | POA: Diagnosis not present

## 2022-12-17 DIAGNOSIS — N2581 Secondary hyperparathyroidism of renal origin: Secondary | ICD-10-CM | POA: Diagnosis not present

## 2022-12-17 DIAGNOSIS — N186 End stage renal disease: Secondary | ICD-10-CM | POA: Diagnosis not present

## 2022-12-19 DIAGNOSIS — D631 Anemia in chronic kidney disease: Secondary | ICD-10-CM | POA: Diagnosis not present

## 2022-12-19 DIAGNOSIS — N186 End stage renal disease: Secondary | ICD-10-CM | POA: Diagnosis not present

## 2022-12-19 DIAGNOSIS — Z992 Dependence on renal dialysis: Secondary | ICD-10-CM | POA: Diagnosis not present

## 2022-12-19 DIAGNOSIS — N2581 Secondary hyperparathyroidism of renal origin: Secondary | ICD-10-CM | POA: Diagnosis not present

## 2022-12-19 NOTE — Telephone Encounter (Signed)
Patient left VM requesting to speak with Dr. Randie Heinz prior to AVF/AVG surgery on 8/15. Will have scheduling arrange a phone visit per request.

## 2022-12-20 DIAGNOSIS — Z992 Dependence on renal dialysis: Secondary | ICD-10-CM | POA: Diagnosis not present

## 2022-12-20 DIAGNOSIS — I158 Other secondary hypertension: Secondary | ICD-10-CM | POA: Diagnosis not present

## 2022-12-20 DIAGNOSIS — N186 End stage renal disease: Secondary | ICD-10-CM | POA: Diagnosis not present

## 2022-12-22 DIAGNOSIS — N2581 Secondary hyperparathyroidism of renal origin: Secondary | ICD-10-CM | POA: Diagnosis not present

## 2022-12-22 DIAGNOSIS — N186 End stage renal disease: Secondary | ICD-10-CM | POA: Diagnosis not present

## 2022-12-22 DIAGNOSIS — Z992 Dependence on renal dialysis: Secondary | ICD-10-CM | POA: Diagnosis not present

## 2022-12-24 ENCOUNTER — Other Ambulatory Visit: Payer: Self-pay | Admitting: Family Medicine

## 2022-12-24 DIAGNOSIS — N2581 Secondary hyperparathyroidism of renal origin: Secondary | ICD-10-CM | POA: Diagnosis not present

## 2022-12-24 DIAGNOSIS — I10 Essential (primary) hypertension: Secondary | ICD-10-CM | POA: Diagnosis not present

## 2022-12-24 DIAGNOSIS — I480 Paroxysmal atrial fibrillation: Secondary | ICD-10-CM

## 2022-12-24 DIAGNOSIS — N186 End stage renal disease: Secondary | ICD-10-CM | POA: Diagnosis not present

## 2022-12-24 DIAGNOSIS — I129 Hypertensive chronic kidney disease with stage 1 through stage 4 chronic kidney disease, or unspecified chronic kidney disease: Secondary | ICD-10-CM

## 2022-12-24 DIAGNOSIS — Z992 Dependence on renal dialysis: Secondary | ICD-10-CM | POA: Diagnosis not present

## 2022-12-25 NOTE — Telephone Encounter (Signed)
Notes from OV on 07/23/22:   Hypertension with renal disease BP adequately controlled. Continue Carvedilol 12.5 mg bid and low salt diet. Continue monitoring BP regularly. F/U in 6 months, before if needed.  No appt noted at this time.

## 2022-12-26 ENCOUNTER — Telehealth: Payer: Self-pay

## 2022-12-26 DIAGNOSIS — N186 End stage renal disease: Secondary | ICD-10-CM | POA: Diagnosis not present

## 2022-12-26 DIAGNOSIS — Z992 Dependence on renal dialysis: Secondary | ICD-10-CM | POA: Diagnosis not present

## 2022-12-26 DIAGNOSIS — N2581 Secondary hyperparathyroidism of renal origin: Secondary | ICD-10-CM | POA: Diagnosis not present

## 2022-12-26 NOTE — Progress Notes (Unsigned)
Care Management & Coordination Services Pharmacy Team  Reason for Encounter: Medication coordination and delivery  Contacted patient to discuss medications and coordinate delivery from [facility]. {US HC Outreach:28874}  Cycle dispensing form sent to *** for review.   Last adherence delivery date: 12/09/2022      Patient is due for next adherence delivery on: 01/08/2023  This delivery to include: Vials  30 Days  Atorvastatin 10 mg - 1 tablet once daily Linzess 72 mcg - 1 capsule before breakfast  Famotidine 20mg  - 1 tablet twice daily  Carvedilol 12.5 mg - 1 tablet twice daily Lansoprazole 30 mg - take once daily  Hydralazine 25 mg - 1/2 tablet 2 times daily  Renvela 800 mg - 2 tablets 3 times daily Albuterol Inhaler - use as needed   Patient declined the following medications this month: Diltiazem 60 mg  - 1 tablet 3 times daily as needed for Afib   {Delivery date:25786}  Any concerns about your medications? {yes/no:20286}  How often do you forget or accidentally miss a dose? {Missed doses:25554}  Do you use a pillbox? {yes/no:20286}  Recent blood pressure readings are as follows:***   Chart review: Recent office visits:  None  Recent consult visits:  12/04/2022 Claudette Laws MD (phys med) - Patient was seen for facet arthritis of lumbar region. No medication changes.   Hospital visits:  None  Medications: Outpatient Encounter Medications as of 12/26/2022  Medication Sig Note   albuterol (VENTOLIN HFA) 108 (90 Base) MCG/ACT inhaler Inhale 2 puffs into the lungs every 6 (six) hours as needed for wheezing or shortness of breath.    atorvastatin (LIPITOR) 10 MG tablet TAKE ONE TABLET BY MOUTH ONCE DAILY    B Complex-C-Zn-Folic Acid (DIALYVITE 800-ZINC 15) 0.8 MG TABS Take 1 tablet by mouth daily.    carvedilol (COREG) 12.5 MG tablet TAKE ONE TABLET BY MOUTH TWICE DAILY WITH A MEAL    diltiazem (CARDIZEM) 60 MG tablet Take 1 tablet (60 mg total) by mouth 3  (three) times daily as needed (Atrial fibrillation).    famotidine (PEPCID) 20 MG tablet Take 1 tablet (20 mg total) by mouth daily.    fluticasone-salmeterol (ADVAIR) 100-50 MCG/ACT AEPB Inhale 1 puff into the lungs 2 (two) times daily.    hydrALAZINE (APRESOLINE) 25 MG tablet Take 0.5 tablets (12.5 mg total) by mouth in the morning and at bedtime.    lansoprazole (PREVACID) 30 MG capsule Take 1 capsule (30 mg total) by mouth daily.    LINZESS 72 MCG capsule TAKE ONE CAPSULE BY MOUTH BEFORE BREAKFAST    Methoxy PEG-Epoetin Beta (MIRCERA IJ) Inject as directed. 09/05/2022: Given at dialysis   sevelamer carbonate (RENVELA) 800 MG tablet Take 1,600 mg by mouth 2 (two) times daily with a meal.    Facility-Administered Encounter Medications as of 12/26/2022  Medication   0.9 %  sodium chloride infusion   betamethasone acetate-betamethasone sodium phosphate (CELESTONE) injection 12 mg   lidocaine (XYLOCAINE) 1 % (with pres) injection 4 mL   sodium chloride flush (NS) 0.9 % injection 3 mL   BP Readings from Last 3 Encounters:  12/04/22 (!) 175/72  11/26/22 130/80  11/19/22 (!) 148/57    Pulse Readings from Last 3 Encounters:  12/04/22 67  11/26/22 75  11/19/22 62    No results found for: "HGBA1C" Lab Results  Component Value Date   CREATININE 11.81 (H) 11/01/2022   BUN 64 (H) 11/01/2022   GFR 2.96 (LL) 09/19/2021   GFRNONAA 3 (  L) 11/01/2022   GFRAA 4 (L) 01/18/2020   NA 134 (L) 11/01/2022   K 4.7 11/01/2022   CALCIUM 8.6 (L) 11/01/2022   CO2 26 11/01/2022    [name] [facility]  Clinical Pharmacist Assistant [phone]

## 2022-12-29 DIAGNOSIS — N186 End stage renal disease: Secondary | ICD-10-CM | POA: Diagnosis not present

## 2022-12-29 DIAGNOSIS — Z992 Dependence on renal dialysis: Secondary | ICD-10-CM | POA: Diagnosis not present

## 2022-12-29 DIAGNOSIS — N2581 Secondary hyperparathyroidism of renal origin: Secondary | ICD-10-CM | POA: Diagnosis not present

## 2022-12-30 ENCOUNTER — Telehealth: Payer: Self-pay

## 2022-12-30 DIAGNOSIS — J4520 Mild intermittent asthma, uncomplicated: Secondary | ICD-10-CM

## 2022-12-30 MED ORDER — ALBUTEROL SULFATE HFA 108 (90 BASE) MCG/ACT IN AERS
2.0000 | INHALATION_SPRAY | Freq: Four times a day (QID) | RESPIRATORY_TRACT | 3 refills | Status: DC | PRN
Start: 2022-12-30 — End: 2023-01-13

## 2022-12-30 NOTE — Telephone Encounter (Signed)
-----   Message from Sherrill Raring, [facility] sent at 12/30/2022 12:07 PM EDT ----- Regarding: Med Refill [facility] requesting refills on behalf of the patient for the following prescription:  Albuterol HFA 2 puffs into the lungs every 6 hours prn shortness of breath or wheezing  Pharmacy Info: [facility] - [place], [place] - [address]  Phone: [phone] Fax: [phone]   Thank you, Sherrill Raring Clinical Pharmacist [phone]

## 2022-12-31 DIAGNOSIS — I4891 Unspecified atrial fibrillation: Secondary | ICD-10-CM | POA: Diagnosis not present

## 2022-12-31 DIAGNOSIS — Z992 Dependence on renal dialysis: Secondary | ICD-10-CM | POA: Diagnosis not present

## 2022-12-31 DIAGNOSIS — N186 End stage renal disease: Secondary | ICD-10-CM | POA: Diagnosis not present

## 2022-12-31 DIAGNOSIS — N2581 Secondary hyperparathyroidism of renal origin: Secondary | ICD-10-CM | POA: Diagnosis not present

## 2023-01-02 DIAGNOSIS — Z992 Dependence on renal dialysis: Secondary | ICD-10-CM | POA: Diagnosis not present

## 2023-01-02 DIAGNOSIS — N186 End stage renal disease: Secondary | ICD-10-CM | POA: Diagnosis not present

## 2023-01-02 DIAGNOSIS — N2581 Secondary hyperparathyroidism of renal origin: Secondary | ICD-10-CM | POA: Diagnosis not present

## 2023-01-05 DIAGNOSIS — N186 End stage renal disease: Secondary | ICD-10-CM | POA: Diagnosis not present

## 2023-01-05 DIAGNOSIS — N2581 Secondary hyperparathyroidism of renal origin: Secondary | ICD-10-CM | POA: Diagnosis not present

## 2023-01-05 DIAGNOSIS — Z992 Dependence on renal dialysis: Secondary | ICD-10-CM | POA: Diagnosis not present

## 2023-01-06 ENCOUNTER — Encounter: Payer: Self-pay | Admitting: Physical Medicine & Rehabilitation

## 2023-01-06 ENCOUNTER — Encounter: Payer: Medicare Other | Admitting: Physical Medicine & Rehabilitation

## 2023-01-07 DIAGNOSIS — Z992 Dependence on renal dialysis: Secondary | ICD-10-CM | POA: Diagnosis not present

## 2023-01-07 DIAGNOSIS — N2581 Secondary hyperparathyroidism of renal origin: Secondary | ICD-10-CM | POA: Diagnosis not present

## 2023-01-07 DIAGNOSIS — N186 End stage renal disease: Secondary | ICD-10-CM | POA: Diagnosis not present

## 2023-01-09 DIAGNOSIS — N186 End stage renal disease: Secondary | ICD-10-CM | POA: Diagnosis not present

## 2023-01-09 DIAGNOSIS — Z992 Dependence on renal dialysis: Secondary | ICD-10-CM | POA: Diagnosis not present

## 2023-01-09 DIAGNOSIS — N2581 Secondary hyperparathyroidism of renal origin: Secondary | ICD-10-CM | POA: Diagnosis not present

## 2023-01-12 DIAGNOSIS — N186 End stage renal disease: Secondary | ICD-10-CM | POA: Diagnosis not present

## 2023-01-12 DIAGNOSIS — Z992 Dependence on renal dialysis: Secondary | ICD-10-CM | POA: Diagnosis not present

## 2023-01-12 DIAGNOSIS — N2581 Secondary hyperparathyroidism of renal origin: Secondary | ICD-10-CM | POA: Diagnosis not present

## 2023-01-13 ENCOUNTER — Ambulatory Visit: Payer: Medicare Other | Admitting: Cardiology

## 2023-01-13 ENCOUNTER — Encounter: Payer: Self-pay | Admitting: Cardiology

## 2023-01-13 VITALS — BP 185/70 | HR 77 | Resp 16 | Ht 65.0 in | Wt 192.8 lb

## 2023-01-13 DIAGNOSIS — Z992 Dependence on renal dialysis: Secondary | ICD-10-CM | POA: Diagnosis not present

## 2023-01-13 DIAGNOSIS — N186 End stage renal disease: Secondary | ICD-10-CM

## 2023-01-13 DIAGNOSIS — I10 Essential (primary) hypertension: Secondary | ICD-10-CM

## 2023-01-13 DIAGNOSIS — I480 Paroxysmal atrial fibrillation: Secondary | ICD-10-CM | POA: Diagnosis not present

## 2023-01-13 DIAGNOSIS — I951 Orthostatic hypotension: Secondary | ICD-10-CM | POA: Diagnosis not present

## 2023-01-13 MED ORDER — HYDRALAZINE HCL 50 MG PO TABS
50.0000 mg | ORAL_TABLET | Freq: Three times a day (TID) | ORAL | 3 refills | Status: AC
Start: 2023-01-13 — End: ?

## 2023-01-13 MED ORDER — LABETALOL HCL 200 MG PO TABS
200.0000 mg | ORAL_TABLET | Freq: Two times a day (BID) | ORAL | 3 refills | Status: AC
Start: 2023-01-13 — End: ?

## 2023-01-13 MED ORDER — ISOSORBIDE DINITRATE 30 MG PO TABS
30.0000 mg | ORAL_TABLET | Freq: Three times a day (TID) | ORAL | 2 refills | Status: DC
Start: 2023-01-13 — End: 2023-01-20

## 2023-01-13 NOTE — Progress Notes (Signed)
Primary Physician/Referring:  [place], [name], MD  Patient ID: Stanford Breed, female    DOB: 07-15-1946, 77 y.o.   MRN: [id number]  Chief Complaint  Patient presents with   Paroxysmal atrial fibrillation   Follow-up   HPI:    Jane [name]  is a 77 y.o. female with paroxysmal atrial fibrillation, end-stage disease on hemodialysis on Monday Wednesday and Friday, primary hypertension and orthostatic hypotension, obstructive sleep apnea not on CPAP.   Due to recurrent GI bleed including gastritis, gastric polyps, colonic polyps, AVMs,  in spite of partial colectomy in 2019 She underwent left atrial appendage closure on 02/06/2022 with implantation of a 27 mm watchman flex device.  She also underwent right forearm AV fistula ligated on 07/26/2022 due to recurrent bleeding after dialysis and inability to control bleeding.  She now has a Groshong catheter in the subclavian artery.  She is scheduled for permanent AV shunt placement in August 2024.  She made an appointment to see me in view of uncontrolled hypertension in spite of dialysis.  Otherwise remains asymptomatic and active.   Past Medical History:  Diagnosis Date   Acid reflux    Anemia of chronic disease    Arthritis    Asthma    AVM (arteriovenous malformation)    Bilateral carotid bruits    Diverticulitis    Duodenal ulcer    Dysrhythmia    a-fib   ESRD (end stage renal disease) (HCC)    [facility]   Hemorrhoids    History of blood transfusion    Hypertension    Malaise and fatigue    Orthostatic hypotension    PAF (paroxysmal atrial fibrillation) (HCC)    Presence of Watchman left atrial appendage closure device 02/06/2022   Watchman FLX 27mm with Dr. Excell Seltzer   Sleep apnea    does not need cpap per patient   Syncope    Tubulovillous adenoma of colon    Social History   Tobacco Use   Smoking status: Never    Passive exposure: Never   Smokeless tobacco: Never  Substance Use Topics   Alcohol use: No   ROS   Review of Systems  Cardiovascular:  Negative for chest pain, dyspnea on exertion, leg swelling and palpitations.   Objective  Blood pressure (!) 185/70, pulse 77, resp. rate 16, height 5\' 5"  (1.651 m), weight 192 lb 12.8 oz (87.5 kg), SpO2 98 %.      01/13/2023   10:55 AM 01/06/2023    2:09 PM 01/06/2023    2:06 PM  Vitals with BMI  Height 5\' 5"   5\' 5"   Weight 192 lbs 13 oz  196 lbs  BMI 32.08  32.62  Systolic 185 194 638  Diastolic 70 73 73  Pulse 77  65    Orthostatic VS for the past 72 hrs (Last 3 readings):  Orthostatic BP Patient Position BP Location Cuff Size Orthostatic Pulse  01/13/23 1147 190/80 Standing -- -- --  01/13/23 1057 200/88 Sitting Left Arm -- 73  01/13/23 1055 -- Sitting Left Arm Normal --     Physical Exam Constitutional:      Comments: Moderately obese  Neck:     Vascular: Carotid bruit (Bilateral) present. No JVD.  Cardiovascular:     Rate and Rhythm: Normal rate and regular rhythm.     Pulses: Intact distal pulses.     Heart sounds: Murmur heard.     Early systolic murmur is present with a grade of  2/6 at the upper right sternal border.     No gallop.  Pulmonary:     Effort: Pulmonary effort is normal.     Breath sounds: Normal breath sounds.  Abdominal:     General: Bowel sounds are normal.     Palpations: Abdomen is soft.  Musculoskeletal:     Right lower leg: No edema.     Left lower leg: No edema.    Laboratory examination:   Recent Labs    10/30/22 1814 10/30/22 1955 10/31/22 0814 11/01/22 1657  NA 136 136 135 134*  K 4.1 4.2 5.2* 4.7  CL 93* 95* 98 93*  CO2 27 30  --  26  GLUCOSE 131* 121* 88 113*  BUN 37* 39* 56* 64*  CREATININE 7.71* 8.10* 10.50* 11.81*  CALCIUM 9.4 9.1  --  8.6*  GFRNONAA 5* 5*  --  3*      Latest Ref Rng & Units 11/01/2022    4:57 PM 10/31/2022    8:14 AM 10/30/2022    7:55 PM  CMP  Glucose 70 - 99 mg/dL 403  88  474   BUN 8 - 23 mg/dL 64  56  39   Creatinine 0.44 - 1.00 mg/dL 25.95  63.87  5.64    Sodium 135 - 145 mmol/L 134  135  136   Potassium 3.5 - 5.1 mmol/L 4.7  5.2  4.2   Chloride 98 - 111 mmol/L 93  98  95   CO2 22 - 32 mmol/L 26   30   Calcium 8.9 - 10.3 mg/dL 8.6   9.1   Total Protein 6.5 - 8.1 g/dL   6.9   Total Bilirubin 0.3 - 1.2 mg/dL   0.5   Alkaline Phos 38 - 126 U/L   158   AST 15 - 41 U/L   21   ALT 0 - 44 U/L   16       Latest Ref Rng & Units 11/01/2022    4:57 PM 10/31/2022    8:14 AM 10/30/2022    7:55 PM  CBC  WBC 4.0 - 10.5 K/uL 7.5   5.4   Hemoglobin 12.0 - 15.0 g/dL 9.2  33.2  95.1   Hematocrit 36.0 - 46.0 % 29.5  31.0  32.5   Platelets 150 - 400 K/uL 200   201    Lab Results  Component Value Date   TSH 1.36 11/20/2020    Lab Results  Component Value Date   CHOL 198 10/06/2019   HDL 53 10/06/2019   LDLCALC 135 (H) 10/06/2019   TRIG 51 10/06/2019   CHOLHDL 3.7 10/06/2019    Radiology:   No results found.  Cardiac Studies:    Lexiscan myoview stress test 09/06/2018:  1. Lexiscan stress test was performed. Exercise capacity was not assessed. Stress symptoms included dyspnea, dizziness. Peak effect blood pressure was 158/68 mmHg. The resting and stress electrocardiogram demonstrated normal sinus rhythm, normal resting conduction, no resting arrhythmias and normal rest repolarization.  Stress EKG is non diagnostic for ischemia as it is a pharmacologic stress.  2. The overall quality of the study is good. There is no evidence of abnormal lung activity. Stress and rest SPECT images demonstrate homogeneous tracer distribution throughout the myocardium. Gated SPECT imaging reveals normal myocardial thickening and wall motion. The left ventricular ejection fraction was normal (61%).   3. Low risk study.  Carotid artery duplex  04/24/2020: Stenosis in the right external carotid artery (<50%). Minimal  stenosis in the left internal carotid artery (minimal). Stenosis in the left common carotid artery (<50%). There is mild diffuse heterogeneous plaque  in bilateral carotid arteries. Antegrade right vertebral artery flow.  Follow up in one year is appropriate if clinically indicated. No significant change from 12/02/2016.  Echocardiogram 12/27/2020: Normal LV systolic function with visual EF 60-65%. Left ventricle cavity is normal in size. Moderate left ventricular hypertrophy. Normal global wallmotion. Indeterminate diastolic filling pattern, elevated LAP. No significant valvular heart disease. Compared to study dated 11/24/2017: LVEF is preserved, G2DD is now indeterminate with elevated LAP, MR and TR have improved, otherwise nosignificant change.  EKG  EKG 01/13/2023: Normal sinus rhythm at the rate of 73 bpm, left anterior fascicular block.  Anteroseptal infarct old.  No evidence of ischemia, normal QT interval.  Compared to 11/13/2022, no change.  Medications and allergies   Allergies  Allergen Reactions   Latex Rash   Penicillins Other (See Comments)    Yeast infection / Childhood / Rash   Sulfa Antibiotics Rash   Tape Other (See Comments)    Plastic, silicone, and paper tape causes bruising and pulls off skin. Cloth tape works fine    Current Outpatient Medications:    atorvastatin (LIPITOR) 10 MG tablet, TAKE ONE TABLET BY MOUTH ONCE DAILY, Disp: 90 tablet, Rfl: 1   B Complex-C-Zn-Folic Acid (DIALYVITE 800-ZINC 15) 0.8 MG TABS, Take 1 tablet by mouth daily., Disp: , Rfl:    B Complex-C-Zn-Folic Acid (DIALYVITE/ZINC) TABS, Take by mouth., Disp: , Rfl:    dexamethasone 0.5 MG/5ML elixir, , Disp: , Rfl:    famotidine (PEPCID) 40 MG tablet, Take 40 mg by mouth 2 (two) times daily., Disp: , Rfl:    isosorbide dinitrate (ISORDIL) 30 MG tablet, Take 1 tablet (30 mg total) by mouth 3 (three) times daily., Disp: 90 tablet, Rfl: 2   labetalol (NORMODYNE) 200 MG tablet, Take 1 tablet (200 mg total) by mouth 2 (two) times daily., Disp: 60 tablet, Rfl: 3   lansoprazole (PREVACID) 30 MG capsule, Take 1 capsule (30 mg total) by mouth daily.,  Disp: 90 capsule, Rfl: 1   LINZESS 72 MCG capsule, TAKE ONE CAPSULE BY MOUTH BEFORE BREAKFAST, Disp: 30 capsule, Rfl: 2   sevelamer carbonate (RENVELA) 800 MG tablet, Take 1,600 mg by mouth 2 (two) times daily with a meal., Disp: , Rfl:    hydrALAZINE (APRESOLINE) 50 MG tablet, Take 1 tablet (50 mg total) by mouth 3 (three) times daily., Disp: 270 tablet, Rfl: 3   Methoxy PEG-Epoetin Beta (MIRCERA IJ), Inject as directed., Disp: , Rfl:    methylPREDNISolone (MEDROL) 4 MG tablet, Take by mouth as directed., Disp: , Rfl:   Current Facility-Administered Medications:    betamethasone acetate-betamethasone sodium phosphate (CELESTONE) injection 12 mg, 12 mg, Intramuscular, Once, Kirsteins, Victorino Sparrow, MD   lidocaine (XYLOCAINE) 1 % (with pres) injection 4 mL, 4 mL, Other, Once, Kirsteins, Victorino Sparrow, MD   Assessment     ICD-10-CM   1. Primary hypertension  I10 isosorbide dinitrate (ISORDIL) 30 MG tablet    labetalol (NORMODYNE) 200 MG tablet    hydrALAZINE (APRESOLINE) 50 MG tablet    2. Orthostatic hypotension  I95.1     3. Paroxysmal atrial fibrillation (HCC)  I48.0 EKG 12-Lead    4. ESRD on hemodialysis (HCC)  N18.6    Z99.2       CHA2DS2-VASc Score is 4.  Yearly risk of stroke: 4.8% (HTN, female, age 5).  Score of 1=0.6; 2=2.2; 3=3.2;  4=4.8; 5=7.2; 6=9.8; 7=>9.8) -(CHF; HTN; vasc disease DM,  Female = 1; Age <65 =0; 65-74 = 1,  >75 =2; stroke/embolism= 2).   Meds ordered this encounter  Medications   isosorbide dinitrate (ISORDIL) 30 MG tablet    Sig: Take 1 tablet (30 mg total) by mouth 3 (three) times daily.    Dispense:  90 tablet    Refill:  2   labetalol (NORMODYNE) 200 MG tablet    Sig: Take 1 tablet (200 mg total) by mouth 2 (two) times daily.    Dispense:  60 tablet    Refill:  3    Discontinue Coreg   hydrALAZINE (APRESOLINE) 50 MG tablet    Sig: Take 1 tablet (50 mg total) by mouth 3 (three) times daily.    Dispense:  270 tablet    Refill:  3   Medications  Discontinued During This Encounter  Medication Reason   albuterol (VENTOLIN HFA) 108 (90 Base) MCG/ACT inhaler    famotidine (PEPCID) 20 MG tablet Dose change   hydrALAZINE (APRESOLINE) 25 MG tablet Dose change   sodium chloride flush (NS) 0.9 % injection 3 mL    fluconazole (DIFLUCAN) 150 MG tablet    fluticasone-salmeterol (ADVAIR) 100-50 MCG/ACT AEPB    hydrALAZINE (APRESOLINE) 25 MG tablet    0.9 %  sodium chloride infusion    diltiazem (CARDIZEM) 60 MG tablet Patient has not taken in last 30 days   carvedilol (COREG) 12.5 MG tablet Change in therapy   hydrALAZINE (APRESOLINE) 50 MG tablet Reorder    Recommendations:   Jane [name]  is a 77 y.o. female with paroxysmal atrial fibrillation, end-stage disease on hemodialysis on Monday Wednesday and Friday, primary hypertension and orthostatic hypotension, obstructive sleep apnea not on CPAP.  Due to recurrent GI bleed including gastritis, gastric polyps, colonic polyps, AVMs,  in spite of partial colectomy in 2019 She underwent left atrial appendage closure on 02/06/2022 with implantation of a 27 mm watchman flex device.  1. Primary hypertension Patient made an appointment to see me as her blood pressure has been extremely high in spite of dialysis.  I will [place] discontinue carvedilol and switch her to labetalol 200 mg p.o. twice daily, I will also add isosorbide dinitrate 30 mg p.o. 3 times daily, hydralazine has been increased to 50 mg 3 times daily.  She will continue the same for now.  I would like to see her back in 6 weeks for follow-up.  She has had bradycardia in the past hence we will keep an eye on this.  - isosorbide dinitrate (ISORDIL) 30 MG tablet; Take 1 tablet (30 mg total) by mouth 3 (three) times daily.  Dispense: 90 tablet; Refill: 2 - labetalol (NORMODYNE) 200 MG tablet; Take 1 tablet (200 mg total) by mouth 2 (two) times daily.  Dispense: 60 tablet; Refill: 3 - hydrALAZINE (APRESOLINE) 50 MG tablet; Take 1 tablet (50 mg  total) by mouth 3 (three) times daily.  Dispense: 270 tablet; Refill: 3  2. Orthostatic hypotension She is very mildly orthostatic, dropping her blood pressure by about 15-20 points from sitting to standing position.  She is asymptomatic.  Will continue to monitor this closely.  3. Paroxysmal atrial fibrillation [facility]) Patient fortunately is maintaining sinus rhythm. - EKG 12-Lead  4. ESRD on hemodialysis (HCC) No issues with dialysis, she has not been dropping her blood pressure, hence we will significant amount of leeway to making changes to her medications.  She is also scheduled for placement of  AV shunt sometime in August 2024.  She has arthritis in her knee, she would like to get steroid injection but has not been able to get this due to uncontrolled hypertension.  Her that she can take extra dose of hydralazine and isosorbide dinitrate if her blood pressure is >150/80 mmHg.    Yates Decamp, MD, [facility] 01/13/2023, 11:52 AM Office: [phone]

## 2023-01-14 ENCOUNTER — Ambulatory Visit (INDEPENDENT_AMBULATORY_CARE_PROVIDER_SITE_OTHER): Payer: Medicare Other | Admitting: Vascular Surgery

## 2023-01-14 ENCOUNTER — Encounter: Payer: Self-pay | Admitting: Vascular Surgery

## 2023-01-14 VITALS — BP 153/69 | HR 70 | Temp 98.2°F | Resp 20 | Ht 65.0 in | Wt 196.0 lb

## 2023-01-14 DIAGNOSIS — N186 End stage renal disease: Secondary | ICD-10-CM

## 2023-01-14 DIAGNOSIS — Z992 Dependence on renal dialysis: Secondary | ICD-10-CM | POA: Diagnosis not present

## 2023-01-14 DIAGNOSIS — N2581 Secondary hyperparathyroidism of renal origin: Secondary | ICD-10-CM | POA: Diagnosis not present

## 2023-01-14 NOTE — Progress Notes (Signed)
[name], female   DOB: [birth year], 77 y.o.   MRN: [id number]  Reason for Consult: Follow-up   Referred by [place], [name], MD  Subjective:     HPI:  Jane [name] is a 77 y.o. female history of currently feels lethargic catheter on Mondays, Wednesdays and Fridays.  She has a history of ligated right forearm fistula and thrombosed left upper arm AV fistula.  She has undergone drainage of left arm fluid collection and now venography and plan is for right arm AV graft.  She states the catheter continues to work but is giving her issues particularly using irritation to tape.  Past Medical History:  Diagnosis Date   Acid reflux    Anemia of chronic disease    Arthritis    Asthma    AVM (arteriovenous malformation)    Bilateral carotid bruits    Diverticulitis    Duodenal ulcer    Dysrhythmia    a-fib   ESRD (end stage renal disease) (HCC)    [facility]   Hemorrhoids    History of blood transfusion    Hypertension    Malaise and fatigue    Orthostatic hypotension    PAF (paroxysmal atrial fibrillation) (HCC)    Presence of Watchman left atrial appendage closure device 02/06/2022   Watchman FLX 27mm with Dr. Excell Seltzer   Sleep apnea    does not need cpap per patient   Syncope    Tubulovillous adenoma of colon    Family History  Problem Relation Age of Onset   Heart failure Mother    Stroke Mother    Other Father    Colon cancer Neg Hx    Liver disease Neg Hx    Esophageal cancer Neg Hx    Stomach cancer Neg Hx    Inflammatory bowel disease Neg Hx    Rectal cancer Neg Hx    Pancreatic cancer Neg Hx    Past Surgical History:  Procedure Laterality Date   A/V FISTULAGRAM Right 10/18/2020   Procedure: A/V FISTULAGRAM;  Surgeon: Cephus Shelling, MD;  [facility]: [facility] INVASIVE CV LAB;  Service: Cardiovascular;  Laterality: Right;   A/V FISTULAGRAM Right 09/05/2021   Procedure: A/V Fistulagram;  Surgeon: Cephus Shelling, MD;  Location: [facility] INVASIVE  CV LAB;  Service: Cardiovascular;  Laterality: Right;   AV FISTULA PLACEMENT     AV FISTULA PLACEMENT Left 09/09/2022   Procedure: BRACHIOBASILIC ARTERIOVENOUS (AV) FISTULA CREATION;  Surgeon: Maeola Harman, MD;  Location: [facility] OR;  Service: Vascular;  Laterality: Left;   BACK SURGERY     Lumbar fusion L 4 and L 5   BIOPSY  01/09/2020   Procedure: BIOPSY;  Surgeon: Lemar Lofty., MD;  Location: [facility] ENDOSCOPY;  Service: Gastroenterology;;   BIOPSY  01/31/2021   Procedure: BIOPSY;  Surgeon: Lemar Lofty., MD;  Location: [facility] ENDOSCOPY;  Service: Gastroenterology;;   BIOPSY  09/26/2021   Procedure: BIOPSY;  Surgeon: Tressia Danas, MD;  Location: [facility] ENDOSCOPY;  Service: Gastroenterology;;   COLONOSCOPY     COLONOSCOPY N/A 02/01/2021   Procedure: COLONOSCOPY;  Surgeon: Benancio Deeds, MD;  Location: [facility] ENDOSCOPY;  Service: Gastroenterology;  Laterality: N/A;   COLONOSCOPY WITH PROPOFOL N/A 09/26/2021   Procedure: COLONOSCOPY WITH PROPOFOL;  Surgeon: Tressia Danas, MD;  Location: [facility] ENDOSCOPY;  Service: Gastroenterology;  Laterality: N/A;   COLONOSCOPY WITH PROPOFOL N/A 09/29/2021   Procedure: COLONOSCOPY WITH PROPOFOL;  Surgeon: Tressia Danas, MD;  Location: [facility]  ENDOSCOPY;  Service: Gastroenterology;  Laterality: N/A;   COLONOSCOPY WITH PROPOFOL N/A 10/01/2021   Procedure: COLONOSCOPY WITH PROPOFOL;  Surgeon: Napoleon Form, MD;  Location: [facility] ENDOSCOPY;  Service: Gastroenterology;  Laterality: N/A;   ENDOSCOPIC MUCOSAL RESECTION N/A 01/09/2020   Procedure: ENDOSCOPIC MUCOSAL RESECTION;  Surgeon: Meridee Score Netty Starring., MD;  Location: [facility] ENDOSCOPY;  Service: Gastroenterology;  Laterality: N/A;   ENDOSCOPIC MUCOSAL RESECTION N/A 01/31/2021   Procedure: ENDOSCOPIC MUCOSAL RESECTION;  Surgeon: Meridee Score Netty Starring., MD;  Location: [facility] ENDOSCOPY;  Service: Gastroenterology;  Laterality: N/A;   ENTEROSCOPY N/A 09/26/2021   Procedure: ENTEROSCOPY;  Surgeon: Tressia Danas, MD;  Location: [facility] ENDOSCOPY;  Service: Gastroenterology;  Laterality: N/A;   ESOPHAGOGASTRODUODENOSCOPY     ESOPHAGOGASTRODUODENOSCOPY (EGD) WITH PROPOFOL N/A 01/09/2020   Procedure: ESOPHAGOGASTRODUODENOSCOPY (EGD) WITH PROPOFOL;  Surgeon: Meridee Score Netty Starring., MD;  Location: [facility] ENDOSCOPY;  Service: Gastroenterology;  Laterality: N/A;   ESOPHAGOGASTRODUODENOSCOPY (EGD) WITH PROPOFOL N/A 01/13/2020   Procedure: ESOPHAGOGASTRODUODENOSCOPY (EGD) WITH PROPOFOL;  Surgeon: Hilarie Fredrickson, MD;  Location: [facility] ENDOSCOPY;  Service: Gastroenterology;  Laterality: N/A;   ESOPHAGOGASTRODUODENOSCOPY (EGD) WITH PROPOFOL N/A 01/31/2021   Procedure: ESOPHAGOGASTRODUODENOSCOPY (EGD) WITH PROPOFOL;  Surgeon: Meridee Score Netty Starring., MD;  Location: [facility] ENDOSCOPY;  Service: Gastroenterology;  Laterality: N/A;   EUS N/A 01/09/2020   Procedure: UPPER ENDOSCOPIC ULTRASOUND (EUS) RADIAL;  Surgeon: Lemar Lofty., MD;  Location: [facility] ENDOSCOPY;  Service: Gastroenterology;  Laterality: N/A;   FALSE ANEURYSM REPAIR Right 07/26/2022   Procedure: REPAIR RIGHT RADIO-CEPHALIC FISTULA ANEURYSM;  Surgeon: Victorino Sparrow, MD;  Location: [facility] OR;  Service: Vascular;  Laterality: Right;   GIVENS CAPSULE STUDY  09/29/2021   Procedure: GIVENS CAPSULE STUDY;  Surgeon: Tressia Danas, MD;  Location: [facility] ENDOSCOPY;  Service: Gastroenterology;;   HEMOSTASIS CLIP PLACEMENT  01/09/2020   Procedure: HEMOSTASIS CLIP PLACEMENT;  Surgeon: Lemar Lofty., MD;  Location: [facility] ENDOSCOPY;  Service: Gastroenterology;;   HEMOSTASIS CLIP PLACEMENT  01/31/2021   Procedure: HEMOSTASIS CLIP PLACEMENT;  Surgeon: Lemar Lofty., MD;  Location: [facility] ENDOSCOPY;  Service: Gastroenterology;;   HEMOSTASIS CONTROL  01/13/2020   Procedure: HEMOSTASIS CONTROL;  Surgeon: Hilarie Fredrickson, MD;  Location: [facility] ENDOSCOPY;  Service: Gastroenterology;;  hemaspray   HOT HEMOSTASIS N/A 01/13/2020   Procedure: HOT HEMOSTASIS (ARGON PLASMA  COAGULATION/BICAP);  Surgeon: Hilarie Fredrickson, MD;  Location: [facility] ENDOSCOPY;  Service: Gastroenterology;  Laterality: N/A;   HOT HEMOSTASIS N/A 02/01/2021   Procedure: HOT HEMOSTASIS (ARGON PLASMA COAGULATION/BICAP);  Surgeon: Benancio Deeds, MD;  Location: [facility] ENDOSCOPY;  Service: Gastroenterology;  Laterality: N/A;   HOT HEMOSTASIS N/A 10/01/2021   Procedure: HOT HEMOSTASIS (ARGON PLASMA COAGULATION/BICAP);  Surgeon: Napoleon Form, MD;  Location: [facility] ENDOSCOPY;  Service: Gastroenterology;  Laterality: N/A;   I & D EXTREMITY Left 10/31/2022   Procedure: IRRIGATION AND DEBRIDEMENT AND PLACEMENT OF DRAIN LEFT ARM;  Surgeon: Cephus Shelling, MD;  Location: [facility] OR;  Service: Vascular;  Laterality: Left;   INSERTION OF DIALYSIS CATHETER Left 07/26/2022   Procedure: INSERTION OF TUNNELED DIALYSIS CATHETER;  Surgeon: Victorino Sparrow, MD;  Location: [facility] OR;  Service: Vascular;  Laterality: Left;   IR GENERIC HISTORICAL  07/09/2016   IR US GUIDE VASC ACCESS RIGHT 07/09/2016 Oley Balm, MD [facility]   IR GENERIC HISTORICAL  07/09/2016   IR FLUORO GUIDE CV LINE RIGHT 07/09/2016 Oley Balm, MD [facility]   KNEE ARTHROPLASTY Left    LAPAROSCOPIC SIGMOID COLECTOMY N/A 07/11/2016   Procedure: LAPAROSCOPIC SIGMOID COLECTOMY;  Surgeon: Berna Bue, MD;  Location: [facility] OR;  Service: General;  Laterality: N/A;   LEFT ATRIAL APPENDAGE OCCLUSION N/A 02/06/2022   Procedure: LEFT ATRIAL APPENDAGE OCCLUSION;  Surgeon: Tonny Bollman, MD;  Location: [facility] INVASIVE CV LAB;  Service: Cardiovascular;  Laterality: N/A;   LIGATION OF ARTERIOVENOUS  FISTULA Right 07/26/2022   Procedure: LIGATION OF RIGHT RADIO-CEPHALIC ARTERIOVENOUS  FISTULA;  Surgeon: Victorino Sparrow, MD;  Location: [facility] OR;  Service: Vascular;  Laterality: Right;   MASS EXCISION Right 04/10/2020   Procedure: EXCISION SKIN NODULE RIGHT FOREARM;  Surgeon: Maeola Harman, MD;  Location: [facility] OR;  Service: Vascular;  Laterality:  Right;   POLYPECTOMY  09/26/2021   Procedure: POLYPECTOMY;  Surgeon: Tressia Danas, MD;  Location: [facility] ENDOSCOPY;  Service: Gastroenterology;;   POLYPECTOMY  10/01/2021   Procedure: POLYPECTOMY;  Surgeon: Napoleon Form, MD;  Location: [facility] ENDOSCOPY;  Service: Gastroenterology;;   REVISON OF ARTERIOVENOUS FISTULA Right 07/04/2022   Procedure: REVISON OF RIGHT FOREARM ARTERIOVENOUS FISTULA;  Surgeon: Leonie Douglas, MD;  Location: [facility] OR;  Service: Vascular;  Laterality: Right;   SCLEROTHERAPY  01/09/2020   Procedure: Susa Day;  Surgeon: Mansouraty, Netty Starring., MD;  Location: [facility] ENDOSCOPY;  Service: Gastroenterology;;   Susa Day  01/13/2020   Procedure: Susa Day;  Surgeon: Hilarie Fredrickson, MD;  Location: [facility] ENDOSCOPY;  Service: Gastroenterology;;   SUBMUCOSAL LIFTING INJECTION  01/09/2020   Procedure: SUBMUCOSAL LIFTING INJECTION;  Surgeon: Lemar Lofty., MD;  Location: [facility] ENDOSCOPY;  Service: Gastroenterology;;   TEE WITHOUT CARDIOVERSION N/A 02/06/2022   Procedure: TRANSESOPHAGEAL ECHOCARDIOGRAM (TEE);  Surgeon: Tonny Bollman, MD;  Location: [facility] INVASIVE CV LAB;  Service: Cardiovascular;  Laterality: N/A;   TUBAL LIGATION     UPPER EXTREMITY VENOGRAPHY N/A 10/27/2022   Procedure: UPPER EXTREMITY VENOGRAPHY;  Surgeon: Maeola Harman, MD;  Location: [facility] INVASIVE CV LAB;  Service: Cardiovascular;  Laterality: N/A;    Short Social History:  Social History   Tobacco Use   Smoking status: Never    Passive exposure: Never   Smokeless tobacco: Never  Substance Use Topics   Alcohol use: No    Allergies  Allergen Reactions   Latex Rash   Penicillins Other (See Comments)    Yeast infection / Childhood / Rash   Sulfa Antibiotics Rash   Tape Other (See Comments)    Plastic, silicone, and paper tape causes bruising and pulls off skin. Cloth tape works fine    Current Outpatient Medications  Medication Sig Dispense Refill   atorvastatin (LIPITOR) 10 MG  tablet TAKE ONE TABLET BY MOUTH ONCE DAILY 90 tablet 1   B Complex-C-Zn-Folic Acid (DIALYVITE 800-ZINC 15) 0.8 MG TABS Take 1 tablet by mouth daily.     B Complex-C-Zn-Folic Acid (DIALYVITE/ZINC) TABS Take by mouth.     dexamethasone 0.5 MG/5ML elixir      famotidine (PEPCID) 40 MG tablet Take 40 mg by mouth 2 (two) times daily.     hydrALAZINE (APRESOLINE) 50 MG tablet Take 1 tablet (50 mg total) by mouth 3 (three) times daily. 270 tablet 3   isosorbide dinitrate (ISORDIL) 30 MG tablet Take 1 tablet (30 mg total) by mouth 3 (three) times daily. 90 tablet 2   labetalol (NORMODYNE) 200 MG tablet Take 1 tablet (200 mg total) by mouth 2 (two) times daily. 60 tablet 3   lansoprazole (PREVACID) 30 MG capsule Take 1 capsule (30 mg total) by mouth daily. 90 capsule 1   LINZESS 72 MCG capsule TAKE ONE  CAPSULE BY MOUTH BEFORE BREAKFAST 30 capsule 2   Methoxy PEG-Epoetin Beta (MIRCERA IJ) Inject as directed.     methylPREDNISolone (MEDROL) 4 MG tablet Take by mouth as directed.     sevelamer carbonate (RENVELA) 800 MG tablet Take 1,600 mg by mouth 2 (two) times daily with a meal.     Current Facility-Administered Medications  Medication Dose Route Frequency Provider Last Rate Last Admin   betamethasone acetate-betamethasone sodium phosphate (CELESTONE) injection 12 mg  12 mg Intramuscular Once Kirsteins, Victorino Sparrow, MD       lidocaine (XYLOCAINE) 1 % (with pres) injection 4 mL  4 mL Other Once Kirsteins, Victorino Sparrow, MD        Review of Systems  Constitutional:  Constitutional negative. HENT: HENT negative.  Eyes: Eyes negative.  Respiratory: Respiratory negative.  Cardiovascular: Cardiovascular negative.  GI: Gastrointestinal negative.  Musculoskeletal: Musculoskeletal negative.  Skin: Skin negative.  Neurological: Neurological negative. Hematologic: Hematologic/lymphatic negative.  Psychiatric: Psychiatric negative.        Objective:  Objective   Vitals:   01/14/23 1350  BP: (!) 153/69   Pulse: 70  Resp: 20  Temp: 98.2 F (36.8 C)  SpO2: 95%  Weight: 196 lb (88.9 kg)  Height: 5\' 5"  (1.651 m)   Body mass index is 32.62 kg/m.  Physical Exam HENT:     Head: Normocephalic.     Mouth/Throat:     Mouth: Mucous membranes are moist.  Eyes:     Pupils: Pupils are equal, round, and reactive to light.  Cardiovascular:     Pulses:          Radial pulses are 2+ on the right side and 2+ on the left side.  Pulmonary:     Effort: Pulmonary effort is normal.  Abdominal:     General: Abdomen is flat.  Musculoskeletal:     Comments: Well-healed left upper arm incision  Neurological:     Mental Status: She is alert.  Psychiatric:        Mood and Affect: Mood normal.        Thought Content: Thought content normal.        Judgment: Judgment normal.     Data: Recent reviewed     Assessment/Plan:    77 year old female with above-noted procedures.  Plan for right arm AV graft unlikely she will have any vein for fistula creation on a nondialysis day in the near future.  We discussed the risk benefits alternatives she demonstrates good understanding we will get her scheduled today.    Maeola Harman MD [facility]

## 2023-01-15 ENCOUNTER — Ambulatory Visit (INDEPENDENT_AMBULATORY_CARE_PROVIDER_SITE_OTHER): Payer: Medicare Other | Admitting: Nurse Practitioner

## 2023-01-15 VITALS — BP 114/50 | HR 65 | Ht 65.0 in

## 2023-01-15 DIAGNOSIS — K219 Gastro-esophageal reflux disease without esophagitis: Secondary | ICD-10-CM

## 2023-01-15 DIAGNOSIS — R110 Nausea: Secondary | ICD-10-CM

## 2023-01-15 MED ORDER — ONDANSETRON HCL 4 MG PO TABS: 4.0000 mg | ORAL_TABLET | Freq: Two times a day (BID) | ORAL | 2 refills | Status: DC

## 2023-01-15 NOTE — Progress Notes (Signed)
Primary GI:Jane Pyrtle, MD  Assessment and Plan   Brief Narrative:  77 y.o.  female with multiple comorbidities not limited to atrial fibrillation s/p Watchman placement, carotid artery disease, ESRD on hemodialysis, chronic anemia, duodenal tubulovillous adenoma status post EMR, GERD, H. pylori negative gastritis, multiple adenomatous colon polyps, colon AVMs, s/p partial colectomy secondary to diverticular disease   Chronic GERD without Barrett's esophagus.  Recent escalation of heartburn after GERD regimen reduced during hospitalization -She was doing relatively well with twice daily famotidine and twice daily Pepcid.  Upon hospital discharge a couple of months she was discharged home with twice daily Pepcid but Prevacid was reduced to only once a day.  This morning patient resumed previous dose of twice daily Prevacid.  -She is following antireflux measures -Hopefully with resumption of previous reflux regimen she will be able to get GERD symptoms back under control.  Unclear why she requires so much acid suppression.  Is there a functional component here? -She will follow-up with Dr. Rhea [name] in a few months.  Call in the interim for problems or questions  Chronic constipation.  Manages with Linzess QOD  Intermittent nausea, probably multifactorial Previously Reglan not tolerated  -Wants Zofran 4 mg BID as needed for nausea. Will see it that helps  ESRD on HD MWF  AFIB, not anticoagulated due to history of GI bleed -Has Watchman implant  History of Present Illness   Chief complaint:  reflux, abdominal pain   Jane has a history of chronic nausea for which we have seen her in the office several times. Last seen 05/23/22 for worsening GERD symptoms and nausea. She was continued on twice daily pepcid and twice daily PPI  [name] was hospitalized in April for I&D of left arm hematoma AV fistula site.  She stayed in the hospital several days during which time she said her reflux  medicine was not given.  Upon hospital discharge her normal GERD regimen was reduced.  Since then she has struggled with a bad taste in mouth and heartburn. She sleeps on 3 pillows, goes to bed on an empty stomach.   Today she increased Prevacid back to BID but prescription from hospital was for once daily so will run short of medication.    Previous GI Endoscopies / Labs / Imaging   Most recent studies**  10/01/2021 colonoscopy for unexplained GI bleeding with hematochezia.  Adequate bowel prep.  Complete exam - Three colonic angioectasias. Treated with argon plasma coagulation (APC). - One 5 mm polyp in the transverse colon, removed with a cold snare. Resected and retrieved. - Patent end-to-side colo-colonic anastomosis, characterized by healthy appearing mucosa. - Non-bleeding external and internal hemorrhoids. - The examined portion of the ileum was normal. - The examination was otherwise normal.  09/30/22 Small bowel video capsule study- complete capsule and adequate prep.   No blood in small bowel.  Active bleeding in the colon starting about 1 hour beyond first cecal image and blood throughout the remainder of the colon.  No discrete source identified.  Blood noted in the right colon vs cannot rule out possible source in the very distal TI.     Latest Ref Rng & Units 11/01/2022    4:57 PM 10/30/2022    7:55 PM 07/04/2022    7:58 PM  Hepatic Function  Total Protein 6.5 - 8.1 g/dL  6.9  7.3   Albumin 3.5 - 5.0 g/dL 2.9  3.3  3.4   AST 15 - 41 U/L  21  23   ALT 0 - 44 U/L  16  15   Alk Phosphatase 38 - 126 U/L  158  231   Total Bilirubin 0.3 - 1.2 mg/dL  0.5  0.4        Latest Ref Rng & Units 11/01/2022    4:57 PM 10/31/2022    8:14 AM 10/30/2022    7:55 PM  CBC  WBC 4.0 - 10.5 K/uL 7.5   5.4   Hemoglobin 12.0 - 15.0 g/dL 9.2  16.1  09.6   Hematocrit 36.0 - 46.0 % 29.5  31.0  32.5   Platelets 150 - 400 K/uL 200   201      Past Medical History:  Diagnosis Date   Acid reflux     Anemia of chronic disease    Arthritis    Asthma    AVM (arteriovenous malformation)    Bilateral carotid bruits    Diverticulitis    Duodenal ulcer    Dysrhythmia    a-fib   ESRD (end stage renal disease) (HCC)    MWF [facility]   Hemorrhoids    History of blood transfusion    Hypertension    Malaise and fatigue    Orthostatic hypotension    PAF (paroxysmal atrial fibrillation) (HCC)    Presence of Watchman left atrial appendage closure device 02/06/2022   Watchman FLX 27mm with Dr. Excell [name]   Sleep apnea    does not need cpap per patient   Syncope    Tubulovillous adenoma of colon     Past Surgical History:  Procedure Laterality Date   A/V FISTULAGRAM Right 10/18/2020   Procedure: A/V FISTULAGRAM;  Surgeon: Cephus Shelling, MD;  Location: [facility] INVASIVE CV LAB;  Service: Cardiovascular;  Laterality: Right;   A/V FISTULAGRAM Right 09/05/2021   Procedure: A/V Fistulagram;  Surgeon: Cephus Shelling, MD;  [facility] INVASIVE CV LAB;  Service: Cardiovascular;  Laterality: Right;   AV FISTULA PLACEMENT     AV FISTULA PLACEMENT Left 09/09/2022   Procedure: BRACHIOBASILIC ARTERIOVENOUS (AV) FISTULA CREATION;  Surgeon: Maeola Harman, MD;  Location: [facility] OR;  Service: Vascular;  Laterality: Left;   BACK SURGERY     Lumbar fusion L 4 and L 5   BIOPSY  01/09/2020   Procedure: BIOPSY;  Surgeon: Lemar Lofty., MD;  Location: [facility] ENDOSCOPY;  Service: Gastroenterology;;   BIOPSY  01/31/2021   Procedure: BIOPSY;  Surgeon: Lemar Lofty., MD;  Location: [facility] ENDOSCOPY;  Service: Gastroenterology;;   BIOPSY  09/26/2021   Procedure: BIOPSY;  Surgeon: Tressia Danas, MD;  Location: [facility] ENDOSCOPY;  Service: Gastroenterology;;   COLONOSCOPY     COLONOSCOPY N/A 02/01/2021   Procedure: COLONOSCOPY;  Surgeon: Benancio Deeds, MD;  Location: [facility] ENDOSCOPY;  Service: Gastroenterology;  Laterality: N/A;   COLONOSCOPY WITH PROPOFOL N/A 09/26/2021   Procedure:  COLONOSCOPY WITH PROPOFOL;  Surgeon: Tressia Danas, MD;  Location: [facility] ENDOSCOPY;  Service: Gastroenterology;  Laterality: N/A;   COLONOSCOPY WITH PROPOFOL N/A 09/29/2021   Procedure: COLONOSCOPY WITH PROPOFOL;  Surgeon: Tressia Danas, MD;  Location: [facility] ENDOSCOPY;  Service: Gastroenterology;  Laterality: N/A;   COLONOSCOPY WITH PROPOFOL N/A 10/01/2021   Procedure: COLONOSCOPY WITH PROPOFOL;  Surgeon: Napoleon Form, MD;  Location: [facility] ENDOSCOPY;  Service: Gastroenterology;  Laterality: N/A;   ENDOSCOPIC MUCOSAL RESECTION N/A 01/09/2020   Procedure: ENDOSCOPIC MUCOSAL RESECTION;  Surgeon: Meridee Score Netty Starring., MD;  Location: [facility] ENDOSCOPY;  Service: Gastroenterology;  Laterality: N/A;   ENDOSCOPIC MUCOSAL RESECTION  N/A 01/31/2021   Procedure: ENDOSCOPIC MUCOSAL RESECTION;  Surgeon: Meridee Score Netty Starring., MD;  Location: [facility] ENDOSCOPY;  Service: Gastroenterology;  Laterality: N/A;   ENTEROSCOPY N/A 09/26/2021   Procedure: ENTEROSCOPY;  Surgeon: Tressia Danas, MD;  Location: [facility] ENDOSCOPY;  Service: Gastroenterology;  Laterality: N/A;   ESOPHAGOGASTRODUODENOSCOPY     ESOPHAGOGASTRODUODENOSCOPY (EGD) WITH PROPOFOL N/A 01/09/2020   Procedure: ESOPHAGOGASTRODUODENOSCOPY (EGD) WITH PROPOFOL;  Surgeon: Meridee Score Netty Starring., MD;  Location: [facility] ENDOSCOPY;  Service: Gastroenterology;  Laterality: N/A;   ESOPHAGOGASTRODUODENOSCOPY (EGD) WITH PROPOFOL N/A 01/13/2020   Procedure: ESOPHAGOGASTRODUODENOSCOPY (EGD) WITH PROPOFOL;  Surgeon: Hilarie Fredrickson, MD;  Location: [facility] ENDOSCOPY;  Service: Gastroenterology;  Laterality: N/A;   ESOPHAGOGASTRODUODENOSCOPY (EGD) WITH PROPOFOL N/A 01/31/2021   Procedure: ESOPHAGOGASTRODUODENOSCOPY (EGD) WITH PROPOFOL;  Surgeon: Meridee Score Netty Starring., MD;  Location: [facility] ENDOSCOPY;  Service: Gastroenterology;  Laterality: N/A;   EUS N/A 01/09/2020   Procedure: UPPER ENDOSCOPIC ULTRASOUND (EUS) RADIAL;  Surgeon: Lemar Lofty., MD;  Location: [facility] ENDOSCOPY;  Service:  Gastroenterology;  Laterality: N/A;   FALSE ANEURYSM REPAIR Right 07/26/2022   Procedure: REPAIR RIGHT RADIO-CEPHALIC FISTULA ANEURYSM;  Surgeon: Victorino Sparrow, MD;  Location: [facility] OR;  Service: Vascular;  Laterality: Right;   GIVENS CAPSULE STUDY  09/29/2021   Procedure: GIVENS CAPSULE STUDY;  Surgeon: Tressia Danas, MD;  Location: [facility] ENDOSCOPY;  Service: Gastroenterology;;   HEMOSTASIS CLIP PLACEMENT  01/09/2020   Procedure: HEMOSTASIS CLIP PLACEMENT;  Surgeon: Lemar Lofty., MD;  Location: [facility] ENDOSCOPY;  Service: Gastroenterology;;   HEMOSTASIS CLIP PLACEMENT  01/31/2021   Procedure: HEMOSTASIS CLIP PLACEMENT;  Surgeon: Lemar Lofty., MD;  Location: [facility] ENDOSCOPY;  Service: Gastroenterology;;   HEMOSTASIS CONTROL  01/13/2020   Procedure: HEMOSTASIS CONTROL;  Surgeon: Hilarie Fredrickson, MD;  Location: [facility] ENDOSCOPY;  Service: Gastroenterology;;  hemaspray   HOT HEMOSTASIS N/A 01/13/2020   Procedure: HOT HEMOSTASIS (ARGON PLASMA COAGULATION/BICAP);  Surgeon: Hilarie Fredrickson, MD;  Location: [facility] ENDOSCOPY;  Service: Gastroenterology;  Laterality: N/A;   HOT HEMOSTASIS N/A 02/01/2021   Procedure: HOT HEMOSTASIS (ARGON PLASMA COAGULATION/BICAP);  Surgeon: Benancio Deeds, MD;  Location: [facility] ENDOSCOPY;  Service: Gastroenterology;  Laterality: N/A;   HOT HEMOSTASIS N/A 10/01/2021   Procedure: HOT HEMOSTASIS (ARGON PLASMA COAGULATION/BICAP);  Surgeon: Napoleon Form, MD;  Location: [facility] ENDOSCOPY;  Service: Gastroenterology;  Laterality: N/A;   I & D EXTREMITY Left 10/31/2022   Procedure: IRRIGATION AND DEBRIDEMENT AND PLACEMENT OF DRAIN LEFT ARM;  Surgeon: Cephus Shelling, MD;  Location: [facility] OR;  Service: Vascular;  Laterality: Left;   INSERTION OF DIALYSIS CATHETER Left 07/26/2022   Procedure: INSERTION OF TUNNELED DIALYSIS CATHETER;  Surgeon: Victorino Sparrow, MD;  Location: [facility] OR;  Service: Vascular;  Laterality: Left;   IR GENERIC HISTORICAL  07/09/2016   IR US GUIDE VASC ACCESS  RIGHT 07/09/2016 Oley Balm, MD [facility]   IR GENERIC HISTORICAL  07/09/2016   IR FLUORO GUIDE CV LINE RIGHT 07/09/2016 Oley Balm, MD [facility]   KNEE ARTHROPLASTY Left    LAPAROSCOPIC SIGMOID COLECTOMY N/A 07/11/2016   Procedure: LAPAROSCOPIC SIGMOID COLECTOMY;  Surgeon: Berna Bue, MD;  Location: [facility] OR;  Service: General;  Laterality: N/A;   LEFT ATRIAL APPENDAGE OCCLUSION N/A 02/06/2022   Procedure: LEFT ATRIAL APPENDAGE OCCLUSION;  Surgeon: Tonny Bollman, MD;  Location: [facility] INVASIVE CV LAB;  Service: Cardiovascular;  Laterality: N/A;   LIGATION OF ARTERIOVENOUS  FISTULA Right 07/26/2022   Procedure: LIGATION OF RIGHT RADIO-CEPHALIC ARTERIOVENOUS  FISTULA;  Surgeon: Karin Lieu,  Clementeen Graham, MD;  Location: [facility] OR;  Service: Vascular;  Laterality: Right;   MASS EXCISION Right 04/10/2020   Procedure: EXCISION SKIN NODULE RIGHT FOREARM;  Surgeon: Maeola Harman, MD;  Location: [facility] OR;  Service: Vascular;  Laterality: Right;   POLYPECTOMY  09/26/2021   Procedure: POLYPECTOMY;  Surgeon: Tressia Danas, MD;  Location: [facility] ENDOSCOPY;  Service: Gastroenterology;;   POLYPECTOMY  10/01/2021   Procedure: POLYPECTOMY;  Surgeon: Napoleon Form, MD;  Location[facility] ENDOSCOPY;  Service: Gastroenterology;;   REVISON OF ARTERIOVENOUS FISTULA Right 07/04/2022   Procedure: REVISON OF RIGHT FOREARM ARTERIOVENOUS FISTULA;  Surgeon: Leonie Douglas, MD;  Location: [facility] OR;  Service: Vascular;  Laterality: Right;   SCLEROTHERAPY  01/09/2020   Procedure: Susa Day;  Surgeon: Mansouraty, Netty Starring., MD;  Location: [facility] ENDOSCOPY;  Service: Gastroenterology;;   Susa Day  01/13/2020   Procedure: Susa Day;  Surgeon: Hilarie Fredrickson, MD;  Location: [facility] ENDOSCOPY;  Service: Gastroenterology;;   Brooke Dare INJECTION  01/09/2020   Procedure: SUBMUCOSAL LIFTING INJECTION;  Surgeon: Lemar Lofty., MD;  Location: [facility] ENDOSCOPY;  Service: Gastroenterology;;   TEE WITHOUT  CARDIOVERSION N/A 02/06/2022   Procedure: TRANSESOPHAGEAL ECHOCARDIOGRAM (TEE);  Surgeon: Tonny Bollman, MD;  Location: [facility] INVASIVE CV LAB;  Service: Cardiovascular;  Laterality: N/A;   TUBAL LIGATION     UPPER EXTREMITY VENOGRAPHY N/A 10/27/2022   Procedure: UPPER EXTREMITY VENOGRAPHY;  Surgeon: Maeola Harman, MD;  Location: [facility] INVASIVE CV LAB;  Service: Cardiovascular;  Laterality: N/A;    Family History  Problem Relation Age of Onset   Heart failure Mother    Stroke Mother    Other Father    Colon cancer Neg Hx    Liver disease Neg Hx    Esophageal cancer Neg Hx    Stomach cancer Neg Hx    Inflammatory bowel disease Neg Hx    Rectal cancer Neg Hx    Pancreatic cancer Neg Hx     Current Medications, Allergies, Family History and Social History were reviewed in [employer] record.     Current Outpatient Medications  Medication Sig Dispense Refill   atorvastatin (LIPITOR) 10 MG tablet TAKE ONE TABLET BY MOUTH ONCE DAILY 90 tablet 1   B Complex-C-Zn-Folic Acid (DIALYVITE 800-ZINC 15) 0.8 MG TABS Take 1 tablet by mouth daily.     dexamethasone 0.5 MG/5ML elixir      famotidine (PEPCID) 40 MG tablet Take 40 mg by mouth 2 (two) times daily.     hydrALAZINE (APRESOLINE) 50 MG tablet Take 1 tablet (50 mg total) by mouth 3 (three) times daily. 270 tablet 3   isosorbide dinitrate (ISORDIL) 30 MG tablet Take 1 tablet (30 mg total) by mouth 3 (three) times daily. 90 tablet 2   labetalol (NORMODYNE) 200 MG tablet Take 1 tablet (200 mg total) by mouth 2 (two) times daily. 60 tablet 3   lansoprazole (PREVACID) 30 MG capsule Take 1 capsule (30 mg total) by mouth daily. 90 capsule 1   LINZESS 72 MCG capsule TAKE ONE CAPSULE BY MOUTH BEFORE BREAKFAST 30 capsule 2   Methoxy PEG-Epoetin Beta (MIRCERA IJ) Inject as directed.     methylPREDNISolone (MEDROL) 4 MG tablet Take by mouth as directed.     sevelamer carbonate (RENVELA) 800 MG tablet Take 1,600 mg by mouth  2 (two) times daily with a meal.     Current Facility-Administered Medications  Medication Dose Route Frequency Provider Last Rate Last Admin   betamethasone acetate-betamethasone sodium phosphate (  CELESTONE) injection 12 mg  12 mg Intramuscular Once Kirsteins, Victorino Sparrow, MD       lidocaine (XYLOCAINE) 1 % (with pres) injection 4 mL  4 mL Other Once Kirsteins, Victorino Sparrow, MD        Review of Systems: No chest pain. No shortness of breath. No urinary complaints.    Physical Exam  Wt Readings from Last 3 Encounters:  01/14/23 196 lb (88.9 kg)  01/13/23 192 lb 12.8 oz (87.5 kg)  01/06/23 196 lb (88.9 kg)    BP (!) 114/50   Pulse 65   Ht 5\' 5"  (1.651 m)   BMI 32.62 kg/m  Constitutional:  Pleasant, generally well appearing female in no acute distress. Psychiatric: Normal mood and affect. Behavior is normal. EENT: Pupils normal.  Conjunctivae are normal. No scleral icterus. Neck supple.  Cardiovascular: Normal rate, regular rhythm, + murmur.  Pulmonary/chest: Effort normal and breath sounds normal. No wheezing, rales or rhonchi. Abdominal: Soft, nondistended, nontender. Bowel sounds active throughout. There are no masses palpable. No hepatomegaly. Neurological: Alert and oriented to person place and time.   Willette Cluster, NP  01/15/2023, 2:16 PM

## 2023-01-15 NOTE — Patient Instructions (Signed)
We have sent the following medications to your pharmacy for you to pick up at your convenience: Zofran  Increase Prevacid to twice a day  Continue Pepcid twice daily  _______________________________________________________  If your blood pressure at your visit was 140/90 or greater, please contact your primary care physician to follow up on this.  _______________________________________________________  If you are age 79 or older, your body mass index should be between 23-30. Your Body mass index is 32.62 kg/m. If this is out of the aforementioned range listed, please consider follow up with your Primary Care Provider.  If you are age 51 or younger, your body mass index should be between 19-25. Your Body mass index is 32.62 kg/m. If this is out of the aformentioned range listed, please consider follow up with your Primary Care Provider.   ________________________________________________________  The  GI providers would like to encourage you to use [facility] to communicate with providers for non-urgent requests or questions.  Due to long hold times on the telephone, sending your provider a message by [facility] may be a faster and more efficient way to get a response.  Please allow 48 business hours for a response.  Please remember that this is for non-urgent requests.  _______________________________________________________   I appreciate the  opportunity to care for you  Thank You   Midge Minium

## 2023-01-16 ENCOUNTER — Encounter: Payer: Self-pay | Admitting: Nurse Practitioner

## 2023-01-16 DIAGNOSIS — N186 End stage renal disease: Secondary | ICD-10-CM | POA: Diagnosis not present

## 2023-01-16 DIAGNOSIS — N2581 Secondary hyperparathyroidism of renal origin: Secondary | ICD-10-CM | POA: Diagnosis not present

## 2023-01-16 DIAGNOSIS — Z992 Dependence on renal dialysis: Secondary | ICD-10-CM | POA: Diagnosis not present

## 2023-01-17 NOTE — Progress Notes (Signed)
Addendum: Reviewed and agree with assessment and management plan. Tymira Horkey M, MD  

## 2023-01-19 ENCOUNTER — Telehealth: Payer: Self-pay | Admitting: Nurse Practitioner

## 2023-01-19 DIAGNOSIS — K219 Gastro-esophageal reflux disease without esophagitis: Secondary | ICD-10-CM

## 2023-01-19 DIAGNOSIS — I158 Other secondary hypertension: Secondary | ICD-10-CM | POA: Diagnosis not present

## 2023-01-19 DIAGNOSIS — N2581 Secondary hyperparathyroidism of renal origin: Secondary | ICD-10-CM | POA: Diagnosis not present

## 2023-01-19 DIAGNOSIS — D509 Iron deficiency anemia, unspecified: Secondary | ICD-10-CM | POA: Diagnosis not present

## 2023-01-19 DIAGNOSIS — N186 End stage renal disease: Secondary | ICD-10-CM | POA: Diagnosis not present

## 2023-01-19 DIAGNOSIS — Z992 Dependence on renal dialysis: Secondary | ICD-10-CM | POA: Diagnosis not present

## 2023-01-19 DIAGNOSIS — D631 Anemia in chronic kidney disease: Secondary | ICD-10-CM | POA: Diagnosis not present

## 2023-01-19 MED ORDER — LANSOPRAZOLE 30 MG PO CPDR
30.0000 mg | DELAYED_RELEASE_CAPSULE | Freq: Two times a day (BID) | ORAL | 1 refills | Status: AC
Start: 2023-01-19 — End: ?

## 2023-01-19 NOTE — Progress Notes (Unsigned)
HPI: [name] [name] is a 77 y.o. female, who is here today for chronic disease management.  Last seen on 11/26/22 for persistent cough.  She reports that cough has greatly improved, after adjusting her PPI medication. Currently she is on Prevacid 30 mg twice daily.  Since her last visit she has seen vascular, physical medicine and rehab, and gastroenterologist.  She reports that her blood pressure has been "very high", her nephrologist increase her hydralazine dosage from 25 mg tid to 50 mg tid. However, despite this adjustment, her blood pressure remained elevated at around 220/110.  She saw her cardiologist about 2 weeks ago, labetalol 200 mg twice daily and Isordil 30 mg 3 times daily were added. She has been on Isordil in the past and states that it "dropped" her BP also caused burning sensation in her throat and mouth, no associated tongue edema, dysphagia, dysphonia, or stridor. She states that 2 weeks ago when she took the first dose of Isordil 30 mg, she had a syncopal episode because of hypotension. She tried again 1/2 tablet, her BP went down to low 100's/60's, so she discontinued.  Because BP went up again, she resume medication today, took the whole tablet, she is having occipital headache and fatigue. She denies CP, dyspnea, palpitations, orthopnea, PND, or edema.  ESRD on dialysis. Last H/H in 10/2022 10.5/31. She underwent right forearm AV fistula ligated on 07/26/2022 due to recurrent bleeding after dialysis.  She has a Groshong cath in the subclavian artery. She has not been producing urine for about a year and undergoes dialysis three times a week (Monday, Wednesday, and Friday).   Lab Results  Component Value Date   CREATININE 11.81 (H) 11/01/2022   BUN 64 (H) 11/01/2022   NA 134 (L) 11/01/2022   K 4.7 11/01/2022   CL 93 (L) 11/01/2022   CO2 26 11/01/2022   Lab Results  Component Value Date   CHOL 198 10/06/2019   HDL 53 10/06/2019   LDLCALC 135 (H) 10/06/2019    TRIG 51 10/06/2019   CHOLHDL 3.7 10/06/2019   Currently she is on hydralazine 50 mg 3 times daily and labetalol 200 mg twice daily.  Today she is also complaining of cold feeling and burning sensation in her feet. She reports that she has had the symptoms for years and has been worsening over time.  Echo in 01/2022: LVEF 55 to 60%. Paroxysmal atrial fibrillation and status post Watchman procedure.  Today she is also reporting that she had a sleep study about 1 to 2 years ago and was told that she needed O2 supplementation overnight due to episodes of hypoxemia during study.  She was evaluated by Dr. Manson [name], pulmonologist, and O2 supplementation was ordered but not covered by her insurance. States that sometimes she feels like she is "gasping" for air in the middle of the night, not frequent.  She states that sleep study that did not indicate sleep apnea.  Review of Systems  Constitutional:  Positive for fatigue. Negative for chills and fever.  HENT:  Negative for mouth sores and sore throat.   Respiratory:  Negative for chest tightness and wheezing.   Gastrointestinal:  Positive for nausea (Occasionally, currently on Zofran 4 mg.). Negative for abdominal pain and vomiting.  Endocrine: Negative for cold intolerance and heat intolerance.  Musculoskeletal:  Positive for arthralgias and back pain.  Skin:  Negative for rash.  Neurological:  Negative for facial asymmetry and weakness.  See other pertinent positives and negatives in  HPI.  Current Outpatient Medications on File Prior to Visit  Medication Sig Dispense Refill   atorvastatin (LIPITOR) 10 MG tablet TAKE ONE TABLET BY MOUTH ONCE DAILY 90 tablet 1   B Complex-C-Zn-Folic Acid (DIALYVITE 800-ZINC 15) 0.8 MG TABS Take 1 tablet by mouth daily.     dexamethasone 0.5 MG/5ML elixir      famotidine (PEPCID) 40 MG tablet Take 40 mg by mouth 2 (two) times daily.     hydrALAZINE (APRESOLINE) 50 MG tablet Take 1 tablet (50 mg total) by mouth 3  (three) times daily. 270 tablet 3   labetalol (NORMODYNE) 200 MG tablet Take 1 tablet (200 mg total) by mouth 2 (two) times daily. 60 tablet 3   lansoprazole (PREVACID) 30 MG capsule Take 1 capsule (30 mg total) by mouth 2 (two) times daily before a meal. 180 capsule 1   LINZESS 72 MCG capsule TAKE ONE CAPSULE BY MOUTH BEFORE BREAKFAST 30 capsule 2   Methoxy PEG-Epoetin Beta (MIRCERA IJ) Inject as directed.     methylPREDNISolone (MEDROL) 4 MG tablet Take by mouth as directed.     ondansetron (ZOFRAN) 4 MG tablet Take 1 tablet (4 mg total) by mouth 2 (two) times daily. 30 tablet 2   sevelamer carbonate (RENVELA) 800 MG tablet Take 1,600 mg by mouth 2 (two) times daily with a meal.     Current Facility-Administered Medications on File Prior to Visit  Medication Dose Route Frequency Provider Last Rate Last Admin   betamethasone acetate-betamethasone sodium phosphate (CELESTONE) injection 12 mg  12 mg Intramuscular Once Jane [name] [name], Jane [name] Sparrow, MD       lidocaine (XYLOCAINE) 1 % (with pres) injection 4 mL  4 mL Other Once Jane [name] [name], Jane [name] Sparrow, MD        Past Medical History:  Diagnosis Date   Acid reflux    Anemia of chronic disease    Arthritis    Asthma    AVM (arteriovenous malformation)    Bilateral carotid bruits    Diverticulitis    Duodenal ulcer    Dysrhythmia    a-fib   ESRD (end stage renal disease) (HCC)    [facility]   Hemorrhoids    History of blood transfusion    Hypertension    Malaise and fatigue    Orthostatic hypotension    PAF (paroxysmal atrial fibrillation) (HCC)    Presence of Watchman left atrial appendage closure device 02/06/2022   Watchman FLX 27mm with Dr. Excell [name]   Sleep apnea    does not need cpap per patient   Syncope    Tubulovillous adenoma of colon    Allergies  Allergen Reactions   Latex Rash   Penicillins Other (See Comments)    Yeast infection / Childhood / Rash   Sulfa Antibiotics Rash   Tape Other (See Comments)    Plastic,  silicone, and paper tape causes bruising and pulls off skin. Cloth tape works fine    Social History   Socioeconomic History   Marital status: Divorced    Spouse name: Not on file   Number of children: 4   Years of education: 14   Highest education level: Associate degree: occupational, Scientist, product/process development, or vocational program  Occupational History   Occupation: Retired  Tobacco Use   Smoking status: Never    Passive exposure: Never   Smokeless tobacco: Never  Vaping Use   Vaping Use: Never used  Substance and Sexual Activity   Alcohol use: No   Drug use: No  Sexual activity: Never    Birth control/protection: Post-menopausal  Other Topics Concern   Not on file  Social History Narrative   HH 1   Divorced   Outpatient dialysis Mon, Wed, Fri   4 children: 1 daughter locally is an Engineer, structural and 3 sons in [place]   Social Determinants of Health   Financial Resource Strain: High Risk (07/09/2022)   Overall Financial Resource Strain (CARDIA)    Difficulty of Paying Living Expenses: Hard  Food Insecurity: Food Insecurity Present (10/30/2022)   Hunger Vital Sign    Worried About Running Out of Food in the Last Year: Sometimes true    Ran Out of Food in the Last Year: Sometimes true  Transportation Needs: No Transportation Needs (11/17/2022)   PRAPARE - Administrator, Civil Service (Medical): No    Lack of Transportation (Non-Medical): No  Physical Activity: Inactive (08/06/2021)   Exercise Vital Sign    Days of Exercise per Week: 0 days    Minutes of Exercise per Session: 0 min  Stress: Stress Concern Present (08/21/2021)   [employer] of Occupational Health - Occupational Stress Questionnaire    Feeling of Stress : Very much  Social Connections: Moderately Isolated (07/31/2020)   Social Connection and Isolation Panel [NHANES]    Frequency of Communication with Friends and Family: More than three times a week    Frequency of Social Gatherings with Friends and  Family: Never    Attends Religious Services: More than 4 times per year    Active Member of Clubs or Organizations: No    Attends Banker Meetings: Never    Marital Status: Divorced   Vitals:   01/20/23 1419  BP: 120/60  Pulse: 70  Resp: 16  Temp: 97.7 F (36.5 C)  SpO2: 95%   Body mass index is 32.68 kg/m.  Physical Exam Vitals and nursing note reviewed.  Constitutional:      General: She is not in acute distress.    Appearance: She is well-developed.  HENT:     Head: Normocephalic and atraumatic.     Mouth/Throat:     Mouth: Mucous membranes are moist.  Eyes:     Conjunctiva/sclera: Conjunctivae normal.  Cardiovascular:     Rate and Rhythm: Normal rate and regular rhythm.     Heart sounds: Murmur (SEM II/VI RUSB) heard.     Comments: DP pulses palpable. Left subclavian Groshong cath in place. Pulmonary:     Effort: Pulmonary effort is normal. No respiratory distress.     Breath sounds: Normal breath sounds.  Abdominal:     General: Bowel sounds are normal.     Palpations: Abdomen is soft.     Tenderness: There is no abdominal tenderness.  Musculoskeletal:     Right lower leg: No edema.     Left lower leg: No edema.  Skin:    General: Skin is warm.     Findings: No erythema or rash.  Neurological:     General: No focal deficit present.     Mental Status: She is alert and oriented to person, place, and time.     Cranial Nerves: No cranial nerve deficit.     Comments: Antalgic gait, not assisted today.  Psychiatric:        Mood and Affect: Mood and affect normal.   ASSESSMENT AND PLAN:  Ms. Andelyn was seen today for medical management of chronic issues.  Diagnoses and all orders for this visit:  Other  polyneuropathy Assessment & Plan: Problem has been going on for a few years and gradually getting worse. We discussed diagnosis, prognosis, and treatment options. For now she is not interested in adding medications. We discussed the importance  of appropriate footcare.   Hypertension with renal disease Assessment & Plan: She is reporting episodes of hypotension with a syncopal episode 2 weeks ago when she started new antihypertensive medication, Isordil 30 mg. Today BP otherwise adequate but states that this is low for her. She tried Isordil 15 mg and BP low 100's/60's but BP went up after holding medication for 2 days. She agrees with trying to decrease dose of Isordil from 30 mg to 5 mg 3 times daily. Continue labetalol 200 mg twice daily and hydralazine 50 mg daily. Continue monitoring BP regularly. She has an appointment with her cardiologist next month. Clearly instructed about warning signs.   Orders: -     Isosorbide Dinitrate; Take 1 tablet (5 mg total) by mouth 3 (three) times daily.  Dispense: 90 tablet; Refill: 0  ESRD on hemodialysis [facility]) Assessment & Plan: Has had issues with recurrent bleeding from IV fistula.She has a Groshong catheter in the subclavian artery.  She is scheduled for permanent right upper arm AV fistula creation on 03/05/23. Tolerating dialysis better after her dry weight was increased. Following with nephrology regularly.   Nocturnal hypoxemia Assessment & Plan: Sleep study done on 07/23/2021. Overnight O2 saturation dropped to 80%. Pulmonology recommended 1 L/min of O2 supplementation overnight through NG.O2 supplementation ordered placed.  Orders: -     For home use only DME oxygen   I spent a total of 49 minutes in both face to face and non face to face activities for this visit on the date of this encounter. During this time history was obtained and documented, examination was performed, prior labs/imaging reviewed, and assessment/plan discussed. Sent message to Dr Gillermina Hu to let him know about pt;'s complaints and changes today.  Return in about 6 months (around 07/23/2023) for chronic problems.  Zaydin Billey G. [place], MD  [facility]. [place] office.

## 2023-01-19 NOTE — Telephone Encounter (Signed)
Patient aware that Prevavid 30mg  one capsule twice daily was sent to [facility] per her request. No further questions.

## 2023-01-19 NOTE — Telephone Encounter (Signed)
Patient called requesting a refill on Lansoprazole.

## 2023-01-20 ENCOUNTER — Ambulatory Visit (INDEPENDENT_AMBULATORY_CARE_PROVIDER_SITE_OTHER): Payer: Medicare Other | Admitting: Family Medicine

## 2023-01-20 ENCOUNTER — Encounter: Payer: Self-pay | Admitting: Family Medicine

## 2023-01-20 VITALS — BP 120/60 | HR 70 | Temp 97.7°F | Resp 16 | Ht 65.0 in | Wt 196.4 lb

## 2023-01-20 DIAGNOSIS — G4734 Idiopathic sleep related nonobstructive alveolar hypoventilation: Secondary | ICD-10-CM

## 2023-01-20 DIAGNOSIS — G629 Polyneuropathy, unspecified: Secondary | ICD-10-CM | POA: Insufficient documentation

## 2023-01-20 DIAGNOSIS — I129 Hypertensive chronic kidney disease with stage 1 through stage 4 chronic kidney disease, or unspecified chronic kidney disease: Secondary | ICD-10-CM

## 2023-01-20 DIAGNOSIS — G6289 Other specified polyneuropathies: Secondary | ICD-10-CM | POA: Diagnosis not present

## 2023-01-20 DIAGNOSIS — N186 End stage renal disease: Secondary | ICD-10-CM | POA: Diagnosis not present

## 2023-01-20 DIAGNOSIS — Z992 Dependence on renal dialysis: Secondary | ICD-10-CM

## 2023-01-20 MED ORDER — ISOSORBIDE DINITRATE 5 MG PO TABS
5.0000 mg | ORAL_TABLET | Freq: Three times a day (TID) | ORAL | 0 refills | Status: DC
Start: 2023-01-20 — End: 2023-03-03

## 2023-01-20 NOTE — Assessment & Plan Note (Signed)
She is reporting episodes of hypotension with a syncopal episode 2 weeks ago when she started new antihypertensive medication, Isordil 30 mg. Today BP otherwise adequate but states that this is low for her. She tried Isordil 15 mg and BP low 100's/60's but BP went up after holding medication for 2 days. She agrees with trying to decrease dose of Isordil from 30 mg to 5 mg 3 times daily. Continue labetalol 200 mg twice daily and hydralazine 50 mg daily. Continue monitoring BP regularly. She has an appointment with her cardiologist next month. Clearly instructed about warning signs.

## 2023-01-20 NOTE — Assessment & Plan Note (Signed)
Sleep study done on 07/23/2021. Overnight O2 saturation dropped to 80%. Pulmonology recommended 1 L/min of O2 supplementation overnight through NG.O2 supplementation ordered placed.

## 2023-01-20 NOTE — Assessment & Plan Note (Addendum)
Has had issues with recurrent bleeding from IV fistula.She has a Groshong catheter in the subclavian artery.  She is scheduled for permanent right upper arm AV fistula creation on 03/05/23. Tolerating dialysis better after her dry weight was increased. Following with nephrology regularly.

## 2023-01-20 NOTE — Patient Instructions (Addendum)
A few things to remember from today's visit:  Hypertension with renal disease  ESRD on hemodialysis (HCC)  Other polyneuropathy  Nocturnal hypoxemia Today we are decreasing dose of Isordil from 30 mg to 5 mg to try again 3 times per day. Continue monitoring blood pressure. No changes in rest. We will try to obtain Oxygen supplementation based on your last sleep study. Keep appt with your cardiologist next month.  If you need refills for medications you take chronically, please call your pharmacy. Do not use My Chart to request refills or for acute issues that need immediate attention. If you send a my chart message, it may take a few days to be addressed, specially if I am not in the office.  Please be sure medication list is accurate. If a new problem present, please set up appointment sooner than planned today.

## 2023-01-20 NOTE — Assessment & Plan Note (Addendum)
Problem has been going on for a few years and gradually getting worse. We discussed diagnosis, prognosis, and treatment options. For now she is not interested in adding medications. We discussed the importance of appropriate footcare.

## 2023-01-21 DIAGNOSIS — N186 End stage renal disease: Secondary | ICD-10-CM | POA: Diagnosis not present

## 2023-01-21 DIAGNOSIS — Z992 Dependence on renal dialysis: Secondary | ICD-10-CM | POA: Diagnosis not present

## 2023-01-21 DIAGNOSIS — N2581 Secondary hyperparathyroidism of renal origin: Secondary | ICD-10-CM | POA: Diagnosis not present

## 2023-01-21 DIAGNOSIS — D509 Iron deficiency anemia, unspecified: Secondary | ICD-10-CM | POA: Diagnosis not present

## 2023-01-21 DIAGNOSIS — D631 Anemia in chronic kidney disease: Secondary | ICD-10-CM | POA: Diagnosis not present

## 2023-01-23 DIAGNOSIS — Z992 Dependence on renal dialysis: Secondary | ICD-10-CM | POA: Diagnosis not present

## 2023-01-23 DIAGNOSIS — D509 Iron deficiency anemia, unspecified: Secondary | ICD-10-CM | POA: Diagnosis not present

## 2023-01-23 DIAGNOSIS — N2581 Secondary hyperparathyroidism of renal origin: Secondary | ICD-10-CM | POA: Diagnosis not present

## 2023-01-23 DIAGNOSIS — D631 Anemia in chronic kidney disease: Secondary | ICD-10-CM | POA: Diagnosis not present

## 2023-01-23 DIAGNOSIS — N186 End stage renal disease: Secondary | ICD-10-CM | POA: Diagnosis not present

## 2023-01-26 DIAGNOSIS — D631 Anemia in chronic kidney disease: Secondary | ICD-10-CM | POA: Diagnosis not present

## 2023-01-26 DIAGNOSIS — N2581 Secondary hyperparathyroidism of renal origin: Secondary | ICD-10-CM | POA: Diagnosis not present

## 2023-01-26 DIAGNOSIS — D509 Iron deficiency anemia, unspecified: Secondary | ICD-10-CM | POA: Diagnosis not present

## 2023-01-26 DIAGNOSIS — Z992 Dependence on renal dialysis: Secondary | ICD-10-CM | POA: Diagnosis not present

## 2023-01-26 DIAGNOSIS — N186 End stage renal disease: Secondary | ICD-10-CM | POA: Diagnosis not present

## 2023-01-28 DIAGNOSIS — N186 End stage renal disease: Secondary | ICD-10-CM | POA: Diagnosis not present

## 2023-01-28 DIAGNOSIS — I4891 Unspecified atrial fibrillation: Secondary | ICD-10-CM | POA: Diagnosis not present

## 2023-01-28 DIAGNOSIS — E1129 Type 2 diabetes mellitus with other diabetic kidney complication: Secondary | ICD-10-CM | POA: Diagnosis not present

## 2023-01-28 DIAGNOSIS — N2581 Secondary hyperparathyroidism of renal origin: Secondary | ICD-10-CM | POA: Diagnosis not present

## 2023-01-28 DIAGNOSIS — D631 Anemia in chronic kidney disease: Secondary | ICD-10-CM | POA: Diagnosis not present

## 2023-01-28 DIAGNOSIS — D509 Iron deficiency anemia, unspecified: Secondary | ICD-10-CM | POA: Diagnosis not present

## 2023-01-28 DIAGNOSIS — Z992 Dependence on renal dialysis: Secondary | ICD-10-CM | POA: Diagnosis not present

## 2023-01-30 DIAGNOSIS — D509 Iron deficiency anemia, unspecified: Secondary | ICD-10-CM | POA: Diagnosis not present

## 2023-01-30 DIAGNOSIS — N186 End stage renal disease: Secondary | ICD-10-CM | POA: Diagnosis not present

## 2023-01-30 DIAGNOSIS — D631 Anemia in chronic kidney disease: Secondary | ICD-10-CM | POA: Diagnosis not present

## 2023-01-30 DIAGNOSIS — Z992 Dependence on renal dialysis: Secondary | ICD-10-CM | POA: Diagnosis not present

## 2023-01-30 DIAGNOSIS — N2581 Secondary hyperparathyroidism of renal origin: Secondary | ICD-10-CM | POA: Diagnosis not present

## 2023-02-02 ENCOUNTER — Telehealth: Payer: Self-pay

## 2023-02-02 DIAGNOSIS — N186 End stage renal disease: Secondary | ICD-10-CM | POA: Diagnosis not present

## 2023-02-02 DIAGNOSIS — D631 Anemia in chronic kidney disease: Secondary | ICD-10-CM | POA: Diagnosis not present

## 2023-02-02 DIAGNOSIS — Z992 Dependence on renal dialysis: Secondary | ICD-10-CM | POA: Diagnosis not present

## 2023-02-02 DIAGNOSIS — D509 Iron deficiency anemia, unspecified: Secondary | ICD-10-CM | POA: Diagnosis not present

## 2023-02-02 DIAGNOSIS — N2581 Secondary hyperparathyroidism of renal origin: Secondary | ICD-10-CM | POA: Diagnosis not present

## 2023-02-02 NOTE — Telephone Encounter (Signed)
Spoke with patient to update on provider schedule change and need to change surgery date from 8/15 to another date. Patient agreed to move surgery to 8/29. Instructions reviewed and she voiced understanding. Will also send letter via Mychart.

## 2023-02-04 DIAGNOSIS — N186 End stage renal disease: Secondary | ICD-10-CM | POA: Diagnosis not present

## 2023-02-04 DIAGNOSIS — N2581 Secondary hyperparathyroidism of renal origin: Secondary | ICD-10-CM | POA: Diagnosis not present

## 2023-02-04 DIAGNOSIS — D509 Iron deficiency anemia, unspecified: Secondary | ICD-10-CM | POA: Diagnosis not present

## 2023-02-04 DIAGNOSIS — D631 Anemia in chronic kidney disease: Secondary | ICD-10-CM | POA: Diagnosis not present

## 2023-02-04 DIAGNOSIS — Z992 Dependence on renal dialysis: Secondary | ICD-10-CM | POA: Diagnosis not present

## 2023-02-06 DIAGNOSIS — N2581 Secondary hyperparathyroidism of renal origin: Secondary | ICD-10-CM | POA: Diagnosis not present

## 2023-02-06 DIAGNOSIS — D631 Anemia in chronic kidney disease: Secondary | ICD-10-CM | POA: Diagnosis not present

## 2023-02-06 DIAGNOSIS — Z992 Dependence on renal dialysis: Secondary | ICD-10-CM | POA: Diagnosis not present

## 2023-02-06 DIAGNOSIS — N186 End stage renal disease: Secondary | ICD-10-CM | POA: Diagnosis not present

## 2023-02-06 DIAGNOSIS — D509 Iron deficiency anemia, unspecified: Secondary | ICD-10-CM | POA: Diagnosis not present

## 2023-02-09 DIAGNOSIS — Z992 Dependence on renal dialysis: Secondary | ICD-10-CM | POA: Diagnosis not present

## 2023-02-09 DIAGNOSIS — N186 End stage renal disease: Secondary | ICD-10-CM | POA: Diagnosis not present

## 2023-02-09 DIAGNOSIS — D631 Anemia in chronic kidney disease: Secondary | ICD-10-CM | POA: Diagnosis not present

## 2023-02-09 DIAGNOSIS — N2581 Secondary hyperparathyroidism of renal origin: Secondary | ICD-10-CM | POA: Diagnosis not present

## 2023-02-09 DIAGNOSIS — D509 Iron deficiency anemia, unspecified: Secondary | ICD-10-CM | POA: Diagnosis not present

## 2023-02-11 DIAGNOSIS — N186 End stage renal disease: Secondary | ICD-10-CM | POA: Diagnosis not present

## 2023-02-11 DIAGNOSIS — N2581 Secondary hyperparathyroidism of renal origin: Secondary | ICD-10-CM | POA: Diagnosis not present

## 2023-02-11 DIAGNOSIS — D509 Iron deficiency anemia, unspecified: Secondary | ICD-10-CM | POA: Diagnosis not present

## 2023-02-11 DIAGNOSIS — Z992 Dependence on renal dialysis: Secondary | ICD-10-CM | POA: Diagnosis not present

## 2023-02-11 DIAGNOSIS — D631 Anemia in chronic kidney disease: Secondary | ICD-10-CM | POA: Diagnosis not present

## 2023-02-13 DIAGNOSIS — D509 Iron deficiency anemia, unspecified: Secondary | ICD-10-CM | POA: Diagnosis not present

## 2023-02-13 DIAGNOSIS — D631 Anemia in chronic kidney disease: Secondary | ICD-10-CM | POA: Diagnosis not present

## 2023-02-13 DIAGNOSIS — N186 End stage renal disease: Secondary | ICD-10-CM | POA: Diagnosis not present

## 2023-02-13 DIAGNOSIS — N2581 Secondary hyperparathyroidism of renal origin: Secondary | ICD-10-CM | POA: Diagnosis not present

## 2023-02-13 DIAGNOSIS — Z992 Dependence on renal dialysis: Secondary | ICD-10-CM | POA: Diagnosis not present

## 2023-02-16 DIAGNOSIS — D509 Iron deficiency anemia, unspecified: Secondary | ICD-10-CM | POA: Diagnosis not present

## 2023-02-16 DIAGNOSIS — Z992 Dependence on renal dialysis: Secondary | ICD-10-CM | POA: Diagnosis not present

## 2023-02-16 DIAGNOSIS — D631 Anemia in chronic kidney disease: Secondary | ICD-10-CM | POA: Diagnosis not present

## 2023-02-16 DIAGNOSIS — N186 End stage renal disease: Secondary | ICD-10-CM | POA: Diagnosis not present

## 2023-02-16 DIAGNOSIS — N2581 Secondary hyperparathyroidism of renal origin: Secondary | ICD-10-CM | POA: Diagnosis not present

## 2023-02-18 ENCOUNTER — Telehealth: Payer: Self-pay | Admitting: Nurse Practitioner

## 2023-02-18 DIAGNOSIS — Z992 Dependence on renal dialysis: Secondary | ICD-10-CM | POA: Diagnosis not present

## 2023-02-18 DIAGNOSIS — D631 Anemia in chronic kidney disease: Secondary | ICD-10-CM | POA: Diagnosis not present

## 2023-02-18 DIAGNOSIS — N2581 Secondary hyperparathyroidism of renal origin: Secondary | ICD-10-CM | POA: Diagnosis not present

## 2023-02-18 DIAGNOSIS — D509 Iron deficiency anemia, unspecified: Secondary | ICD-10-CM | POA: Diagnosis not present

## 2023-02-18 DIAGNOSIS — N186 End stage renal disease: Secondary | ICD-10-CM | POA: Diagnosis not present

## 2023-02-18 NOTE — Telephone Encounter (Signed)
Inbound call from patient stating she is still feeling very sick. States she has not been able to eat or drink anything. States at 6/27 office visit her nausea medication dosage was upped but states it has not been helping. Requesting a call back to discuss further. Please advise, thank you.

## 2023-02-18 NOTE — Telephone Encounter (Signed)
Still having issues with her stomach and gas. Her "acid is so bad I can't eat anything." "I can breath and feel it" and "my mouth is on fire" and "My food sits right there in my chest" and she is miserable. She also tells me she is feeling her health is declining. She will be moving back home close to her family so they can take care of her. She wants to see Dr Rhea Belton before September.

## 2023-02-19 DIAGNOSIS — I158 Other secondary hypertension: Secondary | ICD-10-CM | POA: Diagnosis not present

## 2023-02-19 DIAGNOSIS — Z992 Dependence on renal dialysis: Secondary | ICD-10-CM | POA: Diagnosis not present

## 2023-02-19 DIAGNOSIS — N186 End stage renal disease: Secondary | ICD-10-CM | POA: Diagnosis not present

## 2023-02-20 DIAGNOSIS — D509 Iron deficiency anemia, unspecified: Secondary | ICD-10-CM | POA: Diagnosis not present

## 2023-02-20 DIAGNOSIS — D631 Anemia in chronic kidney disease: Secondary | ICD-10-CM | POA: Diagnosis not present

## 2023-02-20 DIAGNOSIS — N186 End stage renal disease: Secondary | ICD-10-CM | POA: Diagnosis not present

## 2023-02-20 DIAGNOSIS — Z992 Dependence on renal dialysis: Secondary | ICD-10-CM | POA: Diagnosis not present

## 2023-02-20 DIAGNOSIS — N2581 Secondary hyperparathyroidism of renal origin: Secondary | ICD-10-CM | POA: Diagnosis not present

## 2023-02-23 DIAGNOSIS — D509 Iron deficiency anemia, unspecified: Secondary | ICD-10-CM | POA: Diagnosis not present

## 2023-02-23 DIAGNOSIS — Z992 Dependence on renal dialysis: Secondary | ICD-10-CM | POA: Diagnosis not present

## 2023-02-23 DIAGNOSIS — N186 End stage renal disease: Secondary | ICD-10-CM | POA: Diagnosis not present

## 2023-02-23 DIAGNOSIS — D631 Anemia in chronic kidney disease: Secondary | ICD-10-CM | POA: Diagnosis not present

## 2023-02-23 DIAGNOSIS — N2581 Secondary hyperparathyroidism of renal origin: Secondary | ICD-10-CM | POA: Diagnosis not present

## 2023-02-23 NOTE — Telephone Encounter (Signed)
Patient aware. She will contact us back when she gets an answer from nephrologist.

## 2023-02-24 ENCOUNTER — Encounter: Payer: Medicare Other | Attending: Physical Medicine & Rehabilitation | Admitting: Physical Medicine & Rehabilitation

## 2023-02-24 ENCOUNTER — Encounter: Payer: Self-pay | Admitting: Physical Medicine & Rehabilitation

## 2023-02-24 ENCOUNTER — Other Ambulatory Visit: Payer: Self-pay | Admitting: Nurse Practitioner

## 2023-02-24 VITALS — BP 147/63 | HR 60 | Ht 65.0 in | Wt 192.0 lb

## 2023-02-24 DIAGNOSIS — G8929 Other chronic pain: Secondary | ICD-10-CM

## 2023-02-24 DIAGNOSIS — K5900 Constipation, unspecified: Secondary | ICD-10-CM

## 2023-02-24 DIAGNOSIS — M25561 Pain in right knee: Secondary | ICD-10-CM | POA: Insufficient documentation

## 2023-02-24 MED ORDER — TRIAMCINOLONE ACETONIDE 32 MG IX SRER
32.0000 mg | Freq: Once | INTRA_ARTICULAR | Status: AC
Start: 2023-02-24 — End: ?

## 2023-02-24 MED ORDER — LIDOCAINE HCL 1 % IJ SOLN
3.0000 mL | Freq: Once | INTRAMUSCULAR | Status: AC
Start: 2023-02-24 — End: 2023-02-24
  Administered 2023-02-24: 3 mL

## 2023-02-24 MED ORDER — TRIAMCINOLONE ACETONIDE 32 MG IX SRER
32.0000 mg | Freq: Once | INTRA_ARTICULAR | Status: AC
Start: 2023-02-24 — End: 2023-02-24
  Administered 2023-02-24: 32 mg via INTRA_ARTICULAR

## 2023-02-24 NOTE — Patient Instructions (Signed)
Triamcinolone Extended-Release Injection What is this medication? TRIAMCINOLONE (trye am SIN oh lone) treats arthritis of the knee. It works by decreasing inflammation. It belongs to a group of medications called steroids. This medicine may be used for other purposes; ask your health care provider or pharmacist if you have questions. COMMON BRAND NAME(S): Zilretta What should I tell my care team before I take this medication? They need to know if you have any of these conditions: Cushing syndrome Diabetes Glaucoma Heart disease High blood pressure Infection, such as tuberculosis, herpes, measles, chickenpox, fungal infection Liver disease Low levels of potassium in the blood Mental health condition Myasthenia gravis Recent heart attack Seizures Stomach or intestine disease Thyroid disease An unusual or allergic reaction to triamcinolone, corticosteroids, benzyl alcohol, other medications, foods, dyes, or preservatives Pregnant or trying to get pregnant Breastfeeding How should I use this medication? This medication is injected into a joint. It is given by your care team in a hospital or clinic setting. Talk to your care team about the use of this medication in children. Special care may be needed. Overdosage: If you think you have taken too much of this medicine contact a poison control center or emergency room at once. NOTE: This medicine is only for you. Do not share this medicine with others. What if I miss a dose? This does not apply. What may interact with this medication? Aspirin Certain antivirals for HIV Certain medications for fungal infections, such as ketoconazole, itraconazole Clarithromycin Mifepristone Nefazodone Other steroid medications Vaccines This list may not describe all possible interactions. Give your health care provider a list of all the medicines, herbs, non-prescription drugs, or dietary supplements you use. Also tell them if you smoke, drink  alcohol, or use illegal drugs. Some items may interact with your medicine. What should I watch for while using this medication? Visit your care team for regular checks on your progress. If you are taking this medication for a long time, carry an identification card with your name, the type and dose of medication, and your care team's name and address. Do not come in contact with people who have chickenpox or the measles while you are taking this medication. If you do, call your care team right away. This medication may increase blood sugar. Ask your care team if changes in diet or medications are needed if you have diabetes. What side effects may I notice from receiving this medication? Side effects that you should report to your care team as soon as possible: Allergic reactions--skin rash, itching, hives, swelling of the face, lips, tongue, or throat Cushing syndrome--increased fat around the midsection, upper back, neck, or face, pink or purple stretch marks on the skin, thinning, fragile skin that easily bruises, unexpected hair growth High blood sugar (hyperglycemia)--increased thirst or amount of urine, unusual weakness or fatigue, blurry vision Increase in blood pressure Infection--fever, chills, cough, sore throat, wounds that don't heal, pain or trouble when passing urine, general feeling of discomfort or being unwell Low adrenal gland function--nausea, vomiting, loss of appetite, unusual weakness or fatigue, dizziness Mood and behavior changes--anxiety, nervousness, confusion, hallucinations, irritability, hostility, thoughts of suicide or self-harm, worsening mood, feelings of depression Severe pain, redness, warmth, or swelling in joint where injected Stomach bleeding--bloody or black, tar-like stools, vomiting blood or brown material that looks like coffee grounds Swelling of the ankles, hands, or feet Side effects that usually do not require medical attention (report these to your care  team if they continue or are bothersome): Acne  General discomfort and fatigue Headache Increase in appetite Nausea Trouble sleeping Weight gain This list may not describe all possible side effects. Call your doctor for medical advice about side effects. You may report side effects to FDA at 1-800-FDA-1088. Where should I keep my medication? This medication is given in a hospital or clinic. It will not be stored at home. NOTE: This sheet is a summary. It may not cover all possible information. If you have questions about this medicine, talk to your doctor, pharmacist, or health care provider.  2024 Elsevier/Gold Standard (2022-01-23 00:00:00)

## 2023-02-24 NOTE — Progress Notes (Signed)
Knee injection Right with ultrasound guidance)  Indication:R Knee pain not relieved by medication management and other conservative care.  Informed consent was obtained after describing risks and benefits of the procedure with the patient, this includes bleeding, bruising, infection and medication side effects. The patient wishes to proceed and has given written consent. The patient was placed in a recumbent position. The medial aspect of the knee was marked and prepped with Betadine and alcohol. It was then entered with a 25-gauge 1-1/2 inch needle and 1 mL of 1% lidocaine was injected into the skin and subcutaneous tissue. Then another needle was inserted into the knee joint. After negative draw back for blood, a solution containing Zilretta (traimcinolone 32mg  /64ml) were injected. The patient tolerated the procedure well. Post procedure instructions were given.  Patient is moving to [place].  Will not schedule follow-up visit.  Has asked for medical records release.

## 2023-02-25 DIAGNOSIS — D631 Anemia in chronic kidney disease: Secondary | ICD-10-CM | POA: Diagnosis not present

## 2023-02-25 DIAGNOSIS — N2581 Secondary hyperparathyroidism of renal origin: Secondary | ICD-10-CM | POA: Diagnosis not present

## 2023-02-25 DIAGNOSIS — D509 Iron deficiency anemia, unspecified: Secondary | ICD-10-CM | POA: Diagnosis not present

## 2023-02-25 DIAGNOSIS — N186 End stage renal disease: Secondary | ICD-10-CM | POA: Diagnosis not present

## 2023-02-25 DIAGNOSIS — Z992 Dependence on renal dialysis: Secondary | ICD-10-CM | POA: Diagnosis not present

## 2023-02-25 NOTE — Telephone Encounter (Signed)
Patient called back. She cannot take Carafate per nephrology.

## 2023-02-25 NOTE — Telephone Encounter (Signed)
Inbound call from patient. She has spoken with her nephrologist and he has advised her not to take recommended medication. Please advise.   Thank you

## 2023-02-27 DIAGNOSIS — Z992 Dependence on renal dialysis: Secondary | ICD-10-CM | POA: Diagnosis not present

## 2023-02-27 DIAGNOSIS — D631 Anemia in chronic kidney disease: Secondary | ICD-10-CM | POA: Diagnosis not present

## 2023-02-27 DIAGNOSIS — N2581 Secondary hyperparathyroidism of renal origin: Secondary | ICD-10-CM | POA: Diagnosis not present

## 2023-02-27 DIAGNOSIS — D509 Iron deficiency anemia, unspecified: Secondary | ICD-10-CM | POA: Diagnosis not present

## 2023-02-27 DIAGNOSIS — N186 End stage renal disease: Secondary | ICD-10-CM | POA: Diagnosis not present

## 2023-02-27 NOTE — Telephone Encounter (Signed)
I would ask her nephrologist if we could use vonoprazan in place of PPI Vonoprazan can be more effective at controlling reflux for some patients; 20 mg daily would be the dose for 8 weeks Vonoprazan would replace her lansoprazole Vonoprazan is only once daily She could also try over-the-counter Gaviscon per bottle instruction for reflux symptoms

## 2023-03-02 DIAGNOSIS — D631 Anemia in chronic kidney disease: Secondary | ICD-10-CM | POA: Diagnosis not present

## 2023-03-02 DIAGNOSIS — N2581 Secondary hyperparathyroidism of renal origin: Secondary | ICD-10-CM | POA: Diagnosis not present

## 2023-03-02 DIAGNOSIS — D509 Iron deficiency anemia, unspecified: Secondary | ICD-10-CM | POA: Diagnosis not present

## 2023-03-02 DIAGNOSIS — Z992 Dependence on renal dialysis: Secondary | ICD-10-CM | POA: Diagnosis not present

## 2023-03-02 DIAGNOSIS — N186 End stage renal disease: Secondary | ICD-10-CM | POA: Diagnosis not present

## 2023-03-02 NOTE — Telephone Encounter (Signed)
Patient notified. She will let us know asap.

## 2023-03-03 ENCOUNTER — Ambulatory Visit: Payer: Medicare Other | Admitting: Cardiology

## 2023-03-03 ENCOUNTER — Encounter: Payer: Self-pay | Admitting: Cardiology

## 2023-03-03 VITALS — BP 128/57 | HR 65 | Resp 16 | Ht 65.0 in | Wt 192.0 lb

## 2023-03-03 DIAGNOSIS — I480 Paroxysmal atrial fibrillation: Secondary | ICD-10-CM

## 2023-03-03 DIAGNOSIS — Z95818 Presence of other cardiac implants and grafts: Secondary | ICD-10-CM

## 2023-03-03 DIAGNOSIS — I10 Essential (primary) hypertension: Secondary | ICD-10-CM

## 2023-03-03 MED ORDER — AMLODIPINE BESYLATE 5 MG PO TABS
5.0000 mg | ORAL_TABLET | Freq: Every day | ORAL | 2 refills | Status: AC
Start: 2023-03-03 — End: 2023-06-01

## 2023-03-03 NOTE — Progress Notes (Signed)
Primary Physician/Referring:  [place], [name], MD  Patient ID: Stanford Breed, female    DOB: [birth year], 77 y.o.   MRN: [id number]  Chief Complaint  Patient presents with   Primary hypertension   Paroxysmal atrial fibrillation [facility])   HPI:    CRESTINA MEY  is a 77 y.o. female with paroxysmal atrial fibrillation, end-stage disease on hemodialysis on Monday Wednesday and Friday, primary hypertension and orthostatic hypotension, obstructive sleep apnea not on CPAP.   Due to recurrent GI bleed including gastritis, gastric polyps, colonic polyps, AVMs,  in spite of partial colectomy in 2019 She underwent left atrial appendage closure on 02/06/2022 with implantation of a 27 mm watchman flex device.    She made an appointment to see me in view of uncontrolled hypertension in spite of dialysis, I had started her on isosorbide dinitrate 6 weeks ago.  States that she has responded well to the medication but has noticed marked decrease in blood pressure and she feels extremely fatigued and tired and dizzy and hence reduce the dose to 1 tablet twice daily but is wondering if she could try something else as it causes her to be "loopy".  Otherwise she is happy that her blood pressure is well-controlled.  Past Medical History:  Diagnosis Date   Acid reflux    Anemia of chronic disease    Arthritis    Asthma    AVM (arteriovenous malformation)    Bilateral carotid bruits    Diverticulitis    Duodenal ulcer    Dysrhythmia    a-fib   ESRD (end stage renal disease) (HCC)    [facility]   Hemorrhoids    History of blood transfusion    Hypertension    Malaise and fatigue    Orthostatic hypotension    PAF (paroxysmal atrial fibrillation) (HCC)    Presence of Watchman left atrial appendage closure device 02/06/2022   Watchman FLX 27mm with Dr. Excell Seltzer   Sleep apnea    does not need cpap per patient   Syncope    Tubulovillous adenoma of colon    Social History   Tobacco Use   Smoking  status: Never    Passive exposure: Never   Smokeless tobacco: Never  Substance Use Topics   Alcohol use: No   ROS  Review of Systems  Cardiovascular:  Negative for chest pain, dyspnea on exertion, leg swelling and palpitations.   Objective  Blood pressure (!) 128/57, pulse 65, resp. rate 16, height 5\' 5"  (1.651 m), weight 192 lb (87.1 kg), SpO2 95%.      03/03/2023   10:49 AM 02/24/2023    9:42 AM 02/24/2023    9:38 AM  Vitals with BMI  Height 5\' 5"   5\' 5"   Weight 192 lbs  192 lbs  BMI 31.95  31.95  Systolic 128 147 409  Diastolic 57 63 68  Pulse 65 60 64    Orthostatic VS for the past 72 hrs (Last 3 readings):  Patient Position BP Location Cuff Size  03/03/23 1049 Sitting Left Arm Normal     Physical Exam Constitutional:      Comments: Moderately obese  Neck:     Vascular: Carotid bruit (Bilateral) present. No JVD.  Cardiovascular:     Rate and Rhythm: Normal rate and regular rhythm.     Pulses: Intact distal pulses.     Heart sounds: Murmur heard.     Early systolic murmur is present with a grade of 2/6 at  the upper right sternal border.     No gallop.  Pulmonary:     Effort: Pulmonary effort is normal.     Breath sounds: Normal breath sounds.  Abdominal:     General: Bowel sounds are normal.     Palpations: Abdomen is soft.  Musculoskeletal:     Right lower leg: No edema.     Left lower leg: No edema.    Laboratory examination:   Recent Labs    10/30/22 1814 10/30/22 1955 10/31/22 0814 11/01/22 1657  NA 136 136 135 134*  K 4.1 4.2 5.2* 4.7  CL 93* 95* 98 93*  CO2 27 30  --  26  GLUCOSE 131* 121* 88 113*  BUN 37* 39* 56* 64*  CREATININE 7.71* 8.10* 10.50* 11.81*  CALCIUM 9.4 9.1  --  8.6*  GFRNONAA 5* 5*  --  3*      Latest Ref Rng & Units 11/01/2022    4:57 PM 10/31/2022    8:14 AM 10/30/2022    7:55 PM  CMP  Glucose 70 - 99 mg/dL 409  88  811   BUN 8 - 23 mg/dL 64  56  39   Creatinine 0.44 - 1.00 mg/dL 91.47  82.95  6.21   Sodium 135 - 145  mmol/L 134  135  136   Potassium 3.5 - 5.1 mmol/L 4.7  5.2  4.2   Chloride 98 - 111 mmol/L 93  98  95   CO2 22 - 32 mmol/L 26   30   Calcium 8.9 - 10.3 mg/dL 8.6   9.1   Total Protein 6.5 - 8.1 g/dL   6.9   Total Bilirubin 0.3 - 1.2 mg/dL   0.5   Alkaline Phos 38 - 126 U/L   158   AST 15 - 41 U/L   21   ALT 0 - 44 U/L   16       Latest Ref Rng & Units 11/01/2022    4:57 PM 10/31/2022    8:14 AM 10/30/2022    7:55 PM  CBC  WBC 4.0 - 10.5 K/uL 7.5   5.4   Hemoglobin 12.0 - 15.0 g/dL 9.2  30.8  65.7   Hematocrit 36.0 - 46.0 % 29.5  31.0  32.5   Platelets 150 - 400 K/uL 200   201    Lab Results  Component Value Date   TSH 1.36 11/20/2020    Lab Results  Component Value Date   CHOL 198 10/06/2019   HDL 53 10/06/2019   LDLCALC 135 (H) 10/06/2019   TRIG 51 10/06/2019   CHOLHDL 3.7 10/06/2019    Radiology:   No results found.  Cardiac Studies:    Lexiscan myoview stress test 09/06/2018:  1. Lexiscan stress test was performed. Exercise capacity was not assessed. Stress symptoms included dyspnea, dizziness. Peak effect blood pressure was 158/68 mmHg. The resting and stress electrocardiogram demonstrated normal sinus rhythm, normal resting conduction, no resting arrhythmias and normal rest repolarization.  Stress EKG is non diagnostic for ischemia as it is a pharmacologic stress.  2. The overall quality of the study is good. There is no evidence of abnormal lung activity. Stress and rest SPECT images demonstrate homogeneous tracer distribution throughout the myocardium. Gated SPECT imaging reveals normal myocardial thickening and wall motion. The left ventricular ejection fraction was normal (61%).   3. Low risk study.  Carotid artery duplex  04/24/2020: Stenosis in the right external carotid artery (<50%). Minimal stenosis in  the left internal carotid artery (minimal). Stenosis in the left common carotid artery (<50%). There is mild diffuse heterogeneous plaque in bilateral  carotid arteries. Antegrade right vertebral artery flow.  Follow up in one year is appropriate if clinically indicated. No significant change from 12/02/2016.  Echocardiogram 12/27/2020: Normal LV systolic function with visual EF 60-65%. Left ventricle cavity is normal in size. Moderate left ventricular hypertrophy. Normal global wallmotion. Indeterminate diastolic filling pattern, elevated LAP. No significant valvular heart disease. Compared to study dated 11/24/2017: LVEF is preserved, G2DD is now indeterminate with elevated LAP, MR and TR have improved, otherwise nosignificant change.  EKG  EKG 03/03/2023: Normal sinus rhythm at the rate of 66 bpm, left atrial enlargement, left anterior fascicular block.  Anteroseptal infarct old.  Single PVC.  Compared to 01/13/2023, no significant change.  Medications and allergies   Allergies  Allergen Reactions   Latex Rash   Penicillins Other (See Comments)    Yeast infection / Childhood / Rash   Sulfa Antibiotics Rash   Tape Other (See Comments)    Plastic, silicone, and paper tape causes bruising and pulls off skin. Cloth tape works fine    Current Outpatient Medications:    amLODipine (NORVASC) 5 MG tablet, Take 1 tablet (5 mg total) by mouth daily., Disp: 30 tablet, Rfl: 2   atorvastatin (LIPITOR) 10 MG tablet, TAKE ONE TABLET BY MOUTH ONCE DAILY, Disp: 90 tablet, Rfl: 1   B Complex-C-Zn-Folic Acid (DIALYVITE 800-ZINC 15) 0.8 MG TABS, Take 1 tablet by mouth daily., Disp: , Rfl:    famotidine (PEPCID) 40 MG tablet, Take 40 mg by mouth 2 (two) times daily., Disp: , Rfl:    hydrALAZINE (APRESOLINE) 50 MG tablet, Take 1 tablet (50 mg total) by mouth 3 (three) times daily., Disp: 270 tablet, Rfl: 3   labetalol (NORMODYNE) 200 MG tablet, Take 1 tablet (200 mg total) by mouth 2 (two) times daily., Disp: 60 tablet, Rfl: 3   lansoprazole (PREVACID) 30 MG capsule, Take 1 capsule (30 mg total) by mouth 2 (two) times daily before a meal., Disp: 180  capsule, Rfl: 1   LINZESS 72 MCG capsule, TAKE ONE CAPSULE BY MOUTH BEFORE BREAKFAST daily, Disp: 30 capsule, Rfl: 2   Methoxy PEG-Epoetin Beta (MIRCERA IJ), Inject as directed., Disp: , Rfl:    methylPREDNISolone (MEDROL) 4 MG tablet, Take by mouth as directed., Disp: , Rfl:    ondansetron (ZOFRAN) 4 MG tablet, Take 1 tablet (4 mg total) by mouth 2 (two) times daily., Disp: 30 tablet, Rfl: 2   sevelamer carbonate (RENVELA) 800 MG tablet, Take 1,600 mg by mouth 2 (two) times daily with a meal., Disp: , Rfl:    isosorbide dinitrate (ISORDIL) 30 MG tablet, Take 1 tablet (30 mg total) by mouth 2 (two) times daily as needed. For BP > 150/80 mm Hg, Disp: , Rfl:   Current Facility-Administered Medications:    lidocaine (XYLOCAINE) 1 % (with pres) injection 4 mL, 4 mL, Other, Once, Kirsteins, Victorino Sparrow, MD   Triamcinolone Acetonide (ZILRETTA) intra-articular injection 32 mg, 32 mg, Intra-articular, Once,    Assessment     ICD-10-CM   1. Paroxysmal atrial fibrillation (HCC)  I48.0 EKG 12-Lead    2. Primary hypertension  I10 isosorbide dinitrate (ISORDIL) 30 MG tablet    amLODipine (NORVASC) 5 MG tablet    3. Presence of Watchman left atrial appendage closure device  Z95.818       CHA2DS2-VASc Score is 4.  Yearly risk of stroke: 4.8% (  HTN, female, age 84).  Score of 1=0.6; 2=2.2; 3=3.2; 4=4.8; 5=7.2; 6=9.8; 7=>9.8) -(CHF; HTN; vasc disease DM,  Female = 1; Age <65 =0; 65-74 = 1,  >75 =2; stroke/embolism= 2).   Meds ordered this encounter  Medications   amLODipine (NORVASC) 5 MG tablet    Sig: Take 1 tablet (5 mg total) by mouth daily.    Dispense:  30 tablet    Refill:  2   Medications Discontinued During This Encounter  Medication Reason   isosorbide dinitrate (ISORDIL) 5 MG tablet    isosorbide dinitrate (ISORDIL) 30 MG tablet Reorder   Recommendations:   Panhia Krewson  is a 77 y.o. female with paroxysmal atrial fibrillation, end-stage disease on hemodialysis on Monday Wednesday and  Friday, primary hypertension and orthostatic hypotension, obstructive sleep apnea not on CPAP.  Due to recurrent GI bleed including gastritis, gastric polyps, colonic polyps, AVMs,  in spite of partial colectomy in 2019 She underwent left atrial appendage closure on 02/06/2022 with implantation of a 27 mm watchman flex device.  Due to recurrent GI bleed, she is now off of all antiplatelet therapy.  She presents here for follow-up of hypertension.  1. Primary hypertension Patient responded well to isosorbide dinitrate but however she developed significant hypotension.  Hence advised her that she could reduce the dose to 2 times a day as needed only and we would like to try amlodipine 5 mg daily.  30 tablets with 2 refills given and patient will let us know if blood pressure will continue to remain well-controlled on the present medical regimen.  With addition of isosorbide dinitrate, blood pressure under excellent control, but feels "loopy" and wants to try something else.  She has noticed hypotension during dialysis.  Hence we will back up on the dose of ISDN to PRN use and add Amlodipine.  - isosorbide dinitrate (ISORDIL) 30 MG tablet; Take 1 tablet (30 mg total) by mouth 2 (two) times daily as needed. For BP > 150/80 mm Hg - amLODipine (NORVASC) 5 MG tablet; Take 1 tablet (5 mg total) by mouth daily.  Dispense: 30 tablet; Refill: 2  2. Paroxysmal atrial fibrillation [facility]) Patient is maintaining sinus rhythm.  She is now off of all anticoagulants and also antiplatelet therapy. - EKG 12-Lead  3. Presence of Watchman left atrial appendage closure device As noted above she is SP watchman left atrial appendage closure.  I will see her back in 6 months for follow-up.    Yates Decamp, MD, [facility] 03/03/2023, 12:20 PM Office: [phone]

## 2023-03-04 DIAGNOSIS — I4891 Unspecified atrial fibrillation: Secondary | ICD-10-CM | POA: Diagnosis not present

## 2023-03-04 DIAGNOSIS — Z992 Dependence on renal dialysis: Secondary | ICD-10-CM | POA: Diagnosis not present

## 2023-03-04 DIAGNOSIS — D509 Iron deficiency anemia, unspecified: Secondary | ICD-10-CM | POA: Diagnosis not present

## 2023-03-04 DIAGNOSIS — N186 End stage renal disease: Secondary | ICD-10-CM | POA: Diagnosis not present

## 2023-03-04 DIAGNOSIS — N2581 Secondary hyperparathyroidism of renal origin: Secondary | ICD-10-CM | POA: Diagnosis not present

## 2023-03-04 DIAGNOSIS — D631 Anemia in chronic kidney disease: Secondary | ICD-10-CM | POA: Diagnosis not present

## 2023-03-06 DIAGNOSIS — D509 Iron deficiency anemia, unspecified: Secondary | ICD-10-CM | POA: Diagnosis not present

## 2023-03-06 DIAGNOSIS — D631 Anemia in chronic kidney disease: Secondary | ICD-10-CM | POA: Diagnosis not present

## 2023-03-06 DIAGNOSIS — N2581 Secondary hyperparathyroidism of renal origin: Secondary | ICD-10-CM | POA: Diagnosis not present

## 2023-03-06 DIAGNOSIS — N186 End stage renal disease: Secondary | ICD-10-CM | POA: Diagnosis not present

## 2023-03-06 DIAGNOSIS — Z992 Dependence on renal dialysis: Secondary | ICD-10-CM | POA: Diagnosis not present

## 2023-03-09 ENCOUNTER — Other Ambulatory Visit: Payer: Self-pay | Admitting: Cardiology

## 2023-03-09 DIAGNOSIS — K219 Gastro-esophageal reflux disease without esophagitis: Secondary | ICD-10-CM

## 2023-03-09 DIAGNOSIS — D631 Anemia in chronic kidney disease: Secondary | ICD-10-CM | POA: Diagnosis not present

## 2023-03-09 DIAGNOSIS — N186 End stage renal disease: Secondary | ICD-10-CM | POA: Diagnosis not present

## 2023-03-09 DIAGNOSIS — N2581 Secondary hyperparathyroidism of renal origin: Secondary | ICD-10-CM | POA: Diagnosis not present

## 2023-03-09 DIAGNOSIS — D509 Iron deficiency anemia, unspecified: Secondary | ICD-10-CM | POA: Diagnosis not present

## 2023-03-09 DIAGNOSIS — Z992 Dependence on renal dialysis: Secondary | ICD-10-CM | POA: Diagnosis not present

## 2023-03-11 DIAGNOSIS — D631 Anemia in chronic kidney disease: Secondary | ICD-10-CM | POA: Diagnosis not present

## 2023-03-11 DIAGNOSIS — N2581 Secondary hyperparathyroidism of renal origin: Secondary | ICD-10-CM | POA: Diagnosis not present

## 2023-03-11 DIAGNOSIS — N186 End stage renal disease: Secondary | ICD-10-CM | POA: Diagnosis not present

## 2023-03-11 DIAGNOSIS — D509 Iron deficiency anemia, unspecified: Secondary | ICD-10-CM | POA: Diagnosis not present

## 2023-03-11 DIAGNOSIS — Z992 Dependence on renal dialysis: Secondary | ICD-10-CM | POA: Diagnosis not present

## 2023-03-13 DIAGNOSIS — Z992 Dependence on renal dialysis: Secondary | ICD-10-CM | POA: Diagnosis not present

## 2023-03-13 DIAGNOSIS — N2581 Secondary hyperparathyroidism of renal origin: Secondary | ICD-10-CM | POA: Diagnosis not present

## 2023-03-13 DIAGNOSIS — N186 End stage renal disease: Secondary | ICD-10-CM | POA: Diagnosis not present

## 2023-03-13 DIAGNOSIS — D509 Iron deficiency anemia, unspecified: Secondary | ICD-10-CM | POA: Diagnosis not present

## 2023-03-13 DIAGNOSIS — D631 Anemia in chronic kidney disease: Secondary | ICD-10-CM | POA: Diagnosis not present

## 2023-03-14 ENCOUNTER — Other Ambulatory Visit: Payer: Self-pay | Admitting: Nurse Practitioner

## 2023-03-16 DIAGNOSIS — D509 Iron deficiency anemia, unspecified: Secondary | ICD-10-CM | POA: Diagnosis not present

## 2023-03-16 DIAGNOSIS — N2581 Secondary hyperparathyroidism of renal origin: Secondary | ICD-10-CM | POA: Diagnosis not present

## 2023-03-16 DIAGNOSIS — D631 Anemia in chronic kidney disease: Secondary | ICD-10-CM | POA: Diagnosis not present

## 2023-03-16 DIAGNOSIS — Z992 Dependence on renal dialysis: Secondary | ICD-10-CM | POA: Diagnosis not present

## 2023-03-16 DIAGNOSIS — N186 End stage renal disease: Secondary | ICD-10-CM | POA: Diagnosis not present

## 2023-03-17 ENCOUNTER — Other Ambulatory Visit: Payer: Self-pay | Admitting: Cardiology

## 2023-03-18 ENCOUNTER — Ambulatory Visit: Payer: Medicare Other | Admitting: Vascular Surgery

## 2023-03-18 DIAGNOSIS — D509 Iron deficiency anemia, unspecified: Secondary | ICD-10-CM | POA: Diagnosis not present

## 2023-03-18 DIAGNOSIS — D631 Anemia in chronic kidney disease: Secondary | ICD-10-CM | POA: Diagnosis not present

## 2023-03-18 DIAGNOSIS — N2581 Secondary hyperparathyroidism of renal origin: Secondary | ICD-10-CM | POA: Diagnosis not present

## 2023-03-18 DIAGNOSIS — Z992 Dependence on renal dialysis: Secondary | ICD-10-CM | POA: Diagnosis not present

## 2023-03-18 DIAGNOSIS — N186 End stage renal disease: Secondary | ICD-10-CM | POA: Diagnosis not present

## 2023-03-19 ENCOUNTER — Ambulatory Visit: Admit: 2023-03-19 | Payer: Medicare Other | Admitting: Vascular Surgery

## 2023-03-19 SURGERY — ARTERIOVENOUS (AV) FISTULA CREATION
Anesthesia: Choice | Laterality: Right

## 2023-03-20 ENCOUNTER — Other Ambulatory Visit: Payer: Self-pay | Admitting: Cardiology

## 2023-03-21 DIAGNOSIS — N2581 Secondary hyperparathyroidism of renal origin: Secondary | ICD-10-CM | POA: Diagnosis not present

## 2023-03-21 DIAGNOSIS — N186 End stage renal disease: Secondary | ICD-10-CM | POA: Diagnosis not present

## 2023-03-21 DIAGNOSIS — Z992 Dependence on renal dialysis: Secondary | ICD-10-CM | POA: Diagnosis not present

## 2023-03-23 DIAGNOSIS — N186 End stage renal disease: Secondary | ICD-10-CM | POA: Diagnosis not present

## 2023-03-23 DIAGNOSIS — D631 Anemia in chronic kidney disease: Secondary | ICD-10-CM | POA: Diagnosis not present

## 2023-03-23 DIAGNOSIS — E785 Hyperlipidemia, unspecified: Secondary | ICD-10-CM | POA: Diagnosis not present

## 2023-03-23 DIAGNOSIS — Z992 Dependence on renal dialysis: Secondary | ICD-10-CM | POA: Diagnosis not present

## 2023-03-23 DIAGNOSIS — N2581 Secondary hyperparathyroidism of renal origin: Secondary | ICD-10-CM | POA: Diagnosis not present

## 2023-03-23 DIAGNOSIS — D509 Iron deficiency anemia, unspecified: Secondary | ICD-10-CM | POA: Diagnosis not present

## 2023-03-25 DIAGNOSIS — N2581 Secondary hyperparathyroidism of renal origin: Secondary | ICD-10-CM | POA: Diagnosis not present

## 2023-03-25 DIAGNOSIS — D509 Iron deficiency anemia, unspecified: Secondary | ICD-10-CM | POA: Diagnosis not present

## 2023-03-25 DIAGNOSIS — Z992 Dependence on renal dialysis: Secondary | ICD-10-CM | POA: Diagnosis not present

## 2023-03-25 DIAGNOSIS — D631 Anemia in chronic kidney disease: Secondary | ICD-10-CM | POA: Diagnosis not present

## 2023-03-25 DIAGNOSIS — N186 End stage renal disease: Secondary | ICD-10-CM | POA: Diagnosis not present

## 2023-03-27 DIAGNOSIS — D509 Iron deficiency anemia, unspecified: Secondary | ICD-10-CM | POA: Diagnosis not present

## 2023-03-27 DIAGNOSIS — N2581 Secondary hyperparathyroidism of renal origin: Secondary | ICD-10-CM | POA: Diagnosis not present

## 2023-03-27 DIAGNOSIS — N186 End stage renal disease: Secondary | ICD-10-CM | POA: Diagnosis not present

## 2023-03-27 DIAGNOSIS — D631 Anemia in chronic kidney disease: Secondary | ICD-10-CM | POA: Diagnosis not present

## 2023-03-27 DIAGNOSIS — Z992 Dependence on renal dialysis: Secondary | ICD-10-CM | POA: Diagnosis not present

## 2023-03-30 DIAGNOSIS — D509 Iron deficiency anemia, unspecified: Secondary | ICD-10-CM | POA: Diagnosis not present

## 2023-03-30 DIAGNOSIS — D631 Anemia in chronic kidney disease: Secondary | ICD-10-CM | POA: Diagnosis not present

## 2023-03-30 DIAGNOSIS — N2581 Secondary hyperparathyroidism of renal origin: Secondary | ICD-10-CM | POA: Diagnosis not present

## 2023-03-30 DIAGNOSIS — Z992 Dependence on renal dialysis: Secondary | ICD-10-CM | POA: Diagnosis not present

## 2023-03-30 DIAGNOSIS — N186 End stage renal disease: Secondary | ICD-10-CM | POA: Diagnosis not present

## 2023-04-01 DIAGNOSIS — D631 Anemia in chronic kidney disease: Secondary | ICD-10-CM | POA: Diagnosis not present

## 2023-04-01 DIAGNOSIS — Z992 Dependence on renal dialysis: Secondary | ICD-10-CM | POA: Diagnosis not present

## 2023-04-01 DIAGNOSIS — N2581 Secondary hyperparathyroidism of renal origin: Secondary | ICD-10-CM | POA: Diagnosis not present

## 2023-04-01 DIAGNOSIS — N186 End stage renal disease: Secondary | ICD-10-CM | POA: Diagnosis not present

## 2023-04-01 DIAGNOSIS — D509 Iron deficiency anemia, unspecified: Secondary | ICD-10-CM | POA: Diagnosis not present

## 2023-04-03 DIAGNOSIS — Z992 Dependence on renal dialysis: Secondary | ICD-10-CM | POA: Diagnosis not present

## 2023-04-03 DIAGNOSIS — D631 Anemia in chronic kidney disease: Secondary | ICD-10-CM | POA: Diagnosis not present

## 2023-04-03 DIAGNOSIS — N2581 Secondary hyperparathyroidism of renal origin: Secondary | ICD-10-CM | POA: Diagnosis not present

## 2023-04-03 DIAGNOSIS — D509 Iron deficiency anemia, unspecified: Secondary | ICD-10-CM | POA: Diagnosis not present

## 2023-04-03 DIAGNOSIS — N186 End stage renal disease: Secondary | ICD-10-CM | POA: Diagnosis not present

## 2023-04-06 DIAGNOSIS — D509 Iron deficiency anemia, unspecified: Secondary | ICD-10-CM | POA: Diagnosis not present

## 2023-04-06 DIAGNOSIS — Z992 Dependence on renal dialysis: Secondary | ICD-10-CM | POA: Diagnosis not present

## 2023-04-06 DIAGNOSIS — N186 End stage renal disease: Secondary | ICD-10-CM | POA: Diagnosis not present

## 2023-04-06 DIAGNOSIS — N2581 Secondary hyperparathyroidism of renal origin: Secondary | ICD-10-CM | POA: Diagnosis not present

## 2023-04-06 DIAGNOSIS — D631 Anemia in chronic kidney disease: Secondary | ICD-10-CM | POA: Diagnosis not present

## 2023-04-08 DIAGNOSIS — N186 End stage renal disease: Secondary | ICD-10-CM | POA: Diagnosis not present

## 2023-04-08 DIAGNOSIS — N2581 Secondary hyperparathyroidism of renal origin: Secondary | ICD-10-CM | POA: Diagnosis not present

## 2023-04-08 DIAGNOSIS — Z992 Dependence on renal dialysis: Secondary | ICD-10-CM | POA: Diagnosis not present

## 2023-04-08 DIAGNOSIS — D509 Iron deficiency anemia, unspecified: Secondary | ICD-10-CM | POA: Diagnosis not present

## 2023-04-08 DIAGNOSIS — D631 Anemia in chronic kidney disease: Secondary | ICD-10-CM | POA: Diagnosis not present

## 2023-04-10 DIAGNOSIS — Z992 Dependence on renal dialysis: Secondary | ICD-10-CM | POA: Diagnosis not present

## 2023-04-10 DIAGNOSIS — D509 Iron deficiency anemia, unspecified: Secondary | ICD-10-CM | POA: Diagnosis not present

## 2023-04-10 DIAGNOSIS — N2581 Secondary hyperparathyroidism of renal origin: Secondary | ICD-10-CM | POA: Diagnosis not present

## 2023-04-10 DIAGNOSIS — D631 Anemia in chronic kidney disease: Secondary | ICD-10-CM | POA: Diagnosis not present

## 2023-04-10 DIAGNOSIS — N186 End stage renal disease: Secondary | ICD-10-CM | POA: Diagnosis not present

## 2023-04-13 DIAGNOSIS — Z992 Dependence on renal dialysis: Secondary | ICD-10-CM | POA: Diagnosis not present

## 2023-04-13 DIAGNOSIS — N2581 Secondary hyperparathyroidism of renal origin: Secondary | ICD-10-CM | POA: Diagnosis not present

## 2023-04-13 DIAGNOSIS — N186 End stage renal disease: Secondary | ICD-10-CM | POA: Diagnosis not present

## 2023-04-13 DIAGNOSIS — D509 Iron deficiency anemia, unspecified: Secondary | ICD-10-CM | POA: Diagnosis not present

## 2023-04-13 DIAGNOSIS — D631 Anemia in chronic kidney disease: Secondary | ICD-10-CM | POA: Diagnosis not present

## 2023-04-15 DIAGNOSIS — Z992 Dependence on renal dialysis: Secondary | ICD-10-CM | POA: Diagnosis not present

## 2023-04-15 DIAGNOSIS — N186 End stage renal disease: Secondary | ICD-10-CM | POA: Diagnosis not present

## 2023-04-15 DIAGNOSIS — D631 Anemia in chronic kidney disease: Secondary | ICD-10-CM | POA: Diagnosis not present

## 2023-04-15 DIAGNOSIS — N2581 Secondary hyperparathyroidism of renal origin: Secondary | ICD-10-CM | POA: Diagnosis not present

## 2023-04-15 DIAGNOSIS — D509 Iron deficiency anemia, unspecified: Secondary | ICD-10-CM | POA: Diagnosis not present

## 2023-04-16 DIAGNOSIS — Z79899 Other long term (current) drug therapy: Secondary | ICD-10-CM | POA: Diagnosis not present

## 2023-04-16 DIAGNOSIS — Z992 Dependence on renal dialysis: Secondary | ICD-10-CM | POA: Diagnosis not present

## 2023-04-16 DIAGNOSIS — I4891 Unspecified atrial fibrillation: Secondary | ICD-10-CM | POA: Diagnosis not present

## 2023-04-16 DIAGNOSIS — I120 Hypertensive chronic kidney disease with stage 5 chronic kidney disease or end stage renal disease: Secondary | ICD-10-CM | POA: Diagnosis not present

## 2023-04-16 DIAGNOSIS — N186 End stage renal disease: Secondary | ICD-10-CM | POA: Diagnosis not present

## 2023-04-16 DIAGNOSIS — R011 Cardiac murmur, unspecified: Secondary | ICD-10-CM | POA: Diagnosis not present

## 2023-04-16 DIAGNOSIS — Z7982 Long term (current) use of aspirin: Secondary | ICD-10-CM | POA: Diagnosis not present

## 2023-04-17 DIAGNOSIS — D631 Anemia in chronic kidney disease: Secondary | ICD-10-CM | POA: Diagnosis not present

## 2023-04-17 DIAGNOSIS — D509 Iron deficiency anemia, unspecified: Secondary | ICD-10-CM | POA: Diagnosis not present

## 2023-04-17 DIAGNOSIS — N186 End stage renal disease: Secondary | ICD-10-CM | POA: Diagnosis not present

## 2023-04-17 DIAGNOSIS — Z992 Dependence on renal dialysis: Secondary | ICD-10-CM | POA: Diagnosis not present

## 2023-04-17 DIAGNOSIS — N2581 Secondary hyperparathyroidism of renal origin: Secondary | ICD-10-CM | POA: Diagnosis not present

## 2023-04-20 DIAGNOSIS — N186 End stage renal disease: Secondary | ICD-10-CM | POA: Diagnosis not present

## 2023-04-20 DIAGNOSIS — D631 Anemia in chronic kidney disease: Secondary | ICD-10-CM | POA: Diagnosis not present

## 2023-04-20 DIAGNOSIS — N2581 Secondary hyperparathyroidism of renal origin: Secondary | ICD-10-CM | POA: Diagnosis not present

## 2023-04-20 DIAGNOSIS — D509 Iron deficiency anemia, unspecified: Secondary | ICD-10-CM | POA: Diagnosis not present

## 2023-04-20 DIAGNOSIS — Z992 Dependence on renal dialysis: Secondary | ICD-10-CM | POA: Diagnosis not present

## 2023-04-22 DIAGNOSIS — D509 Iron deficiency anemia, unspecified: Secondary | ICD-10-CM | POA: Diagnosis not present

## 2023-04-22 DIAGNOSIS — D631 Anemia in chronic kidney disease: Secondary | ICD-10-CM | POA: Diagnosis not present

## 2023-04-22 DIAGNOSIS — N250 Renal osteodystrophy: Secondary | ICD-10-CM | POA: Diagnosis not present

## 2023-04-22 DIAGNOSIS — Z23 Encounter for immunization: Secondary | ICD-10-CM | POA: Diagnosis not present

## 2023-04-22 DIAGNOSIS — Z992 Dependence on renal dialysis: Secondary | ICD-10-CM | POA: Diagnosis not present

## 2023-04-22 DIAGNOSIS — N186 End stage renal disease: Secondary | ICD-10-CM | POA: Diagnosis not present

## 2023-04-22 DIAGNOSIS — N2581 Secondary hyperparathyroidism of renal origin: Secondary | ICD-10-CM | POA: Diagnosis not present

## 2023-04-24 DIAGNOSIS — D631 Anemia in chronic kidney disease: Secondary | ICD-10-CM | POA: Diagnosis not present

## 2023-04-24 DIAGNOSIS — N2581 Secondary hyperparathyroidism of renal origin: Secondary | ICD-10-CM | POA: Diagnosis not present

## 2023-04-24 DIAGNOSIS — Z23 Encounter for immunization: Secondary | ICD-10-CM | POA: Diagnosis not present

## 2023-04-24 DIAGNOSIS — Z992 Dependence on renal dialysis: Secondary | ICD-10-CM | POA: Diagnosis not present

## 2023-04-24 DIAGNOSIS — N186 End stage renal disease: Secondary | ICD-10-CM | POA: Diagnosis not present

## 2023-04-24 DIAGNOSIS — N250 Renal osteodystrophy: Secondary | ICD-10-CM | POA: Diagnosis not present

## 2023-04-27 DIAGNOSIS — Z992 Dependence on renal dialysis: Secondary | ICD-10-CM | POA: Diagnosis not present

## 2023-04-27 DIAGNOSIS — N250 Renal osteodystrophy: Secondary | ICD-10-CM | POA: Diagnosis not present

## 2023-04-27 DIAGNOSIS — N186 End stage renal disease: Secondary | ICD-10-CM | POA: Diagnosis not present

## 2023-04-27 DIAGNOSIS — N2581 Secondary hyperparathyroidism of renal origin: Secondary | ICD-10-CM | POA: Diagnosis not present

## 2023-04-27 DIAGNOSIS — Z23 Encounter for immunization: Secondary | ICD-10-CM | POA: Diagnosis not present

## 2023-04-27 DIAGNOSIS — D631 Anemia in chronic kidney disease: Secondary | ICD-10-CM | POA: Diagnosis not present

## 2023-04-29 DIAGNOSIS — N186 End stage renal disease: Secondary | ICD-10-CM | POA: Diagnosis not present

## 2023-04-29 DIAGNOSIS — N2581 Secondary hyperparathyroidism of renal origin: Secondary | ICD-10-CM | POA: Diagnosis not present

## 2023-04-29 DIAGNOSIS — Z23 Encounter for immunization: Secondary | ICD-10-CM | POA: Diagnosis not present

## 2023-04-29 DIAGNOSIS — D631 Anemia in chronic kidney disease: Secondary | ICD-10-CM | POA: Diagnosis not present

## 2023-04-29 DIAGNOSIS — Z992 Dependence on renal dialysis: Secondary | ICD-10-CM | POA: Diagnosis not present

## 2023-04-29 DIAGNOSIS — N250 Renal osteodystrophy: Secondary | ICD-10-CM | POA: Diagnosis not present

## 2023-05-01 DIAGNOSIS — N2581 Secondary hyperparathyroidism of renal origin: Secondary | ICD-10-CM | POA: Diagnosis not present

## 2023-05-01 DIAGNOSIS — N250 Renal osteodystrophy: Secondary | ICD-10-CM | POA: Diagnosis not present

## 2023-05-01 DIAGNOSIS — Z23 Encounter for immunization: Secondary | ICD-10-CM | POA: Diagnosis not present

## 2023-05-01 DIAGNOSIS — Z992 Dependence on renal dialysis: Secondary | ICD-10-CM | POA: Diagnosis not present

## 2023-05-01 DIAGNOSIS — D631 Anemia in chronic kidney disease: Secondary | ICD-10-CM | POA: Diagnosis not present

## 2023-05-01 DIAGNOSIS — N186 End stage renal disease: Secondary | ICD-10-CM | POA: Diagnosis not present

## 2023-05-04 DIAGNOSIS — Z23 Encounter for immunization: Secondary | ICD-10-CM | POA: Diagnosis not present

## 2023-05-04 DIAGNOSIS — D631 Anemia in chronic kidney disease: Secondary | ICD-10-CM | POA: Diagnosis not present

## 2023-05-04 DIAGNOSIS — N2581 Secondary hyperparathyroidism of renal origin: Secondary | ICD-10-CM | POA: Diagnosis not present

## 2023-05-04 DIAGNOSIS — Z992 Dependence on renal dialysis: Secondary | ICD-10-CM | POA: Diagnosis not present

## 2023-05-04 DIAGNOSIS — N186 End stage renal disease: Secondary | ICD-10-CM | POA: Diagnosis not present

## 2023-05-04 DIAGNOSIS — N250 Renal osteodystrophy: Secondary | ICD-10-CM | POA: Diagnosis not present

## 2023-05-06 DIAGNOSIS — Z23 Encounter for immunization: Secondary | ICD-10-CM | POA: Diagnosis not present

## 2023-05-06 DIAGNOSIS — Z992 Dependence on renal dialysis: Secondary | ICD-10-CM | POA: Diagnosis not present

## 2023-05-06 DIAGNOSIS — D631 Anemia in chronic kidney disease: Secondary | ICD-10-CM | POA: Diagnosis not present

## 2023-05-06 DIAGNOSIS — N186 End stage renal disease: Secondary | ICD-10-CM | POA: Diagnosis not present

## 2023-05-06 DIAGNOSIS — N2581 Secondary hyperparathyroidism of renal origin: Secondary | ICD-10-CM | POA: Diagnosis not present

## 2023-05-06 DIAGNOSIS — N250 Renal osteodystrophy: Secondary | ICD-10-CM | POA: Diagnosis not present

## 2023-05-07 DIAGNOSIS — M179 Osteoarthritis of knee, unspecified: Secondary | ICD-10-CM | POA: Diagnosis not present

## 2023-05-07 DIAGNOSIS — I509 Heart failure, unspecified: Secondary | ICD-10-CM | POA: Diagnosis not present

## 2023-05-07 DIAGNOSIS — K219 Gastro-esophageal reflux disease without esophagitis: Secondary | ICD-10-CM | POA: Diagnosis not present

## 2023-05-07 DIAGNOSIS — M48061 Spinal stenosis, lumbar region without neurogenic claudication: Secondary | ICD-10-CM | POA: Diagnosis not present

## 2023-05-07 DIAGNOSIS — N186 End stage renal disease: Secondary | ICD-10-CM | POA: Diagnosis not present

## 2023-05-08 DIAGNOSIS — Z992 Dependence on renal dialysis: Secondary | ICD-10-CM | POA: Diagnosis not present

## 2023-05-08 DIAGNOSIS — D631 Anemia in chronic kidney disease: Secondary | ICD-10-CM | POA: Diagnosis not present

## 2023-05-08 DIAGNOSIS — N186 End stage renal disease: Secondary | ICD-10-CM | POA: Diagnosis not present

## 2023-05-08 DIAGNOSIS — N250 Renal osteodystrophy: Secondary | ICD-10-CM | POA: Diagnosis not present

## 2023-05-08 DIAGNOSIS — N2581 Secondary hyperparathyroidism of renal origin: Secondary | ICD-10-CM | POA: Diagnosis not present

## 2023-05-08 DIAGNOSIS — Z23 Encounter for immunization: Secondary | ICD-10-CM | POA: Diagnosis not present

## 2023-05-11 DIAGNOSIS — I358 Other nonrheumatic aortic valve disorders: Secondary | ICD-10-CM | POA: Diagnosis not present

## 2023-05-11 DIAGNOSIS — I351 Nonrheumatic aortic (valve) insufficiency: Secondary | ICD-10-CM | POA: Diagnosis not present

## 2023-05-11 DIAGNOSIS — N250 Renal osteodystrophy: Secondary | ICD-10-CM | POA: Diagnosis not present

## 2023-05-11 DIAGNOSIS — Z23 Encounter for immunization: Secondary | ICD-10-CM | POA: Diagnosis not present

## 2023-05-11 DIAGNOSIS — N186 End stage renal disease: Secondary | ICD-10-CM | POA: Diagnosis not present

## 2023-05-11 DIAGNOSIS — I517 Cardiomegaly: Secondary | ICD-10-CM | POA: Diagnosis not present

## 2023-05-11 DIAGNOSIS — I3481 Nonrheumatic mitral (valve) annulus calcification: Secondary | ICD-10-CM | POA: Diagnosis not present

## 2023-05-11 DIAGNOSIS — I4891 Unspecified atrial fibrillation: Secondary | ICD-10-CM | POA: Diagnosis not present

## 2023-05-11 DIAGNOSIS — I10 Essential (primary) hypertension: Secondary | ICD-10-CM | POA: Diagnosis not present

## 2023-05-11 DIAGNOSIS — D631 Anemia in chronic kidney disease: Secondary | ICD-10-CM | POA: Diagnosis not present

## 2023-05-11 DIAGNOSIS — N2581 Secondary hyperparathyroidism of renal origin: Secondary | ICD-10-CM | POA: Diagnosis not present

## 2023-05-11 DIAGNOSIS — Z992 Dependence on renal dialysis: Secondary | ICD-10-CM | POA: Diagnosis not present

## 2023-05-13 DIAGNOSIS — Z992 Dependence on renal dialysis: Secondary | ICD-10-CM | POA: Diagnosis not present

## 2023-05-13 DIAGNOSIS — Z23 Encounter for immunization: Secondary | ICD-10-CM | POA: Diagnosis not present

## 2023-05-13 DIAGNOSIS — D631 Anemia in chronic kidney disease: Secondary | ICD-10-CM | POA: Diagnosis not present

## 2023-05-13 DIAGNOSIS — N250 Renal osteodystrophy: Secondary | ICD-10-CM | POA: Diagnosis not present

## 2023-05-13 DIAGNOSIS — N2581 Secondary hyperparathyroidism of renal origin: Secondary | ICD-10-CM | POA: Diagnosis not present

## 2023-05-13 DIAGNOSIS — N186 End stage renal disease: Secondary | ICD-10-CM | POA: Diagnosis not present

## 2023-05-14 ENCOUNTER — Ambulatory Visit: Payer: Medicare Other | Admitting: Cardiology

## 2023-05-14 DIAGNOSIS — I132 Hypertensive heart and chronic kidney disease with heart failure and with stage 5 chronic kidney disease, or end stage renal disease: Secondary | ICD-10-CM | POA: Diagnosis not present

## 2023-05-14 DIAGNOSIS — Z992 Dependence on renal dialysis: Secondary | ICD-10-CM | POA: Diagnosis not present

## 2023-05-14 DIAGNOSIS — Z7982 Long term (current) use of aspirin: Secondary | ICD-10-CM | POA: Diagnosis not present

## 2023-05-14 DIAGNOSIS — I5030 Unspecified diastolic (congestive) heart failure: Secondary | ICD-10-CM | POA: Diagnosis not present

## 2023-05-14 DIAGNOSIS — I4891 Unspecified atrial fibrillation: Secondary | ICD-10-CM | POA: Diagnosis not present

## 2023-05-14 DIAGNOSIS — Z6832 Body mass index (BMI) 32.0-32.9, adult: Secondary | ICD-10-CM | POA: Diagnosis not present

## 2023-05-14 DIAGNOSIS — N186 End stage renal disease: Secondary | ICD-10-CM | POA: Diagnosis not present

## 2023-05-15 DIAGNOSIS — Z01818 Encounter for other preprocedural examination: Secondary | ICD-10-CM | POA: Diagnosis not present

## 2023-05-15 DIAGNOSIS — D631 Anemia in chronic kidney disease: Secondary | ICD-10-CM | POA: Diagnosis not present

## 2023-05-15 DIAGNOSIS — N186 End stage renal disease: Secondary | ICD-10-CM | POA: Diagnosis not present

## 2023-05-15 DIAGNOSIS — N2581 Secondary hyperparathyroidism of renal origin: Secondary | ICD-10-CM | POA: Diagnosis not present

## 2023-05-15 DIAGNOSIS — N250 Renal osteodystrophy: Secondary | ICD-10-CM | POA: Diagnosis not present

## 2023-05-15 DIAGNOSIS — Z992 Dependence on renal dialysis: Secondary | ICD-10-CM | POA: Diagnosis not present

## 2023-05-15 DIAGNOSIS — Z23 Encounter for immunization: Secondary | ICD-10-CM | POA: Diagnosis not present

## 2023-05-18 DIAGNOSIS — Z992 Dependence on renal dialysis: Secondary | ICD-10-CM | POA: Diagnosis not present

## 2023-05-18 DIAGNOSIS — N186 End stage renal disease: Secondary | ICD-10-CM | POA: Diagnosis not present

## 2023-05-18 DIAGNOSIS — N250 Renal osteodystrophy: Secondary | ICD-10-CM | POA: Diagnosis not present

## 2023-05-18 DIAGNOSIS — Z23 Encounter for immunization: Secondary | ICD-10-CM | POA: Diagnosis not present

## 2023-05-18 DIAGNOSIS — N2581 Secondary hyperparathyroidism of renal origin: Secondary | ICD-10-CM | POA: Diagnosis not present

## 2023-05-18 DIAGNOSIS — D631 Anemia in chronic kidney disease: Secondary | ICD-10-CM | POA: Diagnosis not present

## 2023-05-20 DIAGNOSIS — N186 End stage renal disease: Secondary | ICD-10-CM | POA: Diagnosis not present

## 2023-05-20 DIAGNOSIS — Z23 Encounter for immunization: Secondary | ICD-10-CM | POA: Diagnosis not present

## 2023-05-20 DIAGNOSIS — N250 Renal osteodystrophy: Secondary | ICD-10-CM | POA: Diagnosis not present

## 2023-05-20 DIAGNOSIS — Z992 Dependence on renal dialysis: Secondary | ICD-10-CM | POA: Diagnosis not present

## 2023-05-20 DIAGNOSIS — D631 Anemia in chronic kidney disease: Secondary | ICD-10-CM | POA: Diagnosis not present

## 2023-05-20 DIAGNOSIS — N2581 Secondary hyperparathyroidism of renal origin: Secondary | ICD-10-CM | POA: Diagnosis not present

## 2023-05-21 DIAGNOSIS — N186 End stage renal disease: Secondary | ICD-10-CM | POA: Diagnosis not present

## 2023-05-22 DIAGNOSIS — N2581 Secondary hyperparathyroidism of renal origin: Secondary | ICD-10-CM | POA: Diagnosis not present

## 2023-05-22 DIAGNOSIS — N186 End stage renal disease: Secondary | ICD-10-CM | POA: Diagnosis not present

## 2023-05-22 DIAGNOSIS — Z992 Dependence on renal dialysis: Secondary | ICD-10-CM | POA: Diagnosis not present

## 2023-05-22 DIAGNOSIS — D509 Iron deficiency anemia, unspecified: Secondary | ICD-10-CM | POA: Diagnosis not present

## 2023-05-22 DIAGNOSIS — N250 Renal osteodystrophy: Secondary | ICD-10-CM | POA: Diagnosis not present

## 2023-05-22 DIAGNOSIS — D631 Anemia in chronic kidney disease: Secondary | ICD-10-CM | POA: Diagnosis not present

## 2023-05-25 DIAGNOSIS — N2581 Secondary hyperparathyroidism of renal origin: Secondary | ICD-10-CM | POA: Diagnosis not present

## 2023-05-25 DIAGNOSIS — N186 End stage renal disease: Secondary | ICD-10-CM | POA: Diagnosis not present

## 2023-05-25 DIAGNOSIS — Z992 Dependence on renal dialysis: Secondary | ICD-10-CM | POA: Diagnosis not present

## 2023-05-25 DIAGNOSIS — N250 Renal osteodystrophy: Secondary | ICD-10-CM | POA: Diagnosis not present

## 2023-05-25 DIAGNOSIS — D631 Anemia in chronic kidney disease: Secondary | ICD-10-CM | POA: Diagnosis not present

## 2023-05-25 DIAGNOSIS — D509 Iron deficiency anemia, unspecified: Secondary | ICD-10-CM | POA: Diagnosis not present

## 2023-05-27 DIAGNOSIS — N186 End stage renal disease: Secondary | ICD-10-CM | POA: Diagnosis not present

## 2023-05-27 DIAGNOSIS — N2581 Secondary hyperparathyroidism of renal origin: Secondary | ICD-10-CM | POA: Diagnosis not present

## 2023-05-27 DIAGNOSIS — D631 Anemia in chronic kidney disease: Secondary | ICD-10-CM | POA: Diagnosis not present

## 2023-05-27 DIAGNOSIS — Z992 Dependence on renal dialysis: Secondary | ICD-10-CM | POA: Diagnosis not present

## 2023-05-27 DIAGNOSIS — N250 Renal osteodystrophy: Secondary | ICD-10-CM | POA: Diagnosis not present

## 2023-05-27 DIAGNOSIS — D509 Iron deficiency anemia, unspecified: Secondary | ICD-10-CM | POA: Diagnosis not present

## 2023-05-29 ENCOUNTER — Ambulatory Visit: Payer: Medicare Other | Admitting: Internal Medicine

## 2023-05-29 DIAGNOSIS — N250 Renal osteodystrophy: Secondary | ICD-10-CM | POA: Diagnosis not present

## 2023-05-29 DIAGNOSIS — N2581 Secondary hyperparathyroidism of renal origin: Secondary | ICD-10-CM | POA: Diagnosis not present

## 2023-05-29 DIAGNOSIS — D631 Anemia in chronic kidney disease: Secondary | ICD-10-CM | POA: Diagnosis not present

## 2023-05-29 DIAGNOSIS — D509 Iron deficiency anemia, unspecified: Secondary | ICD-10-CM | POA: Diagnosis not present

## 2023-05-29 DIAGNOSIS — Z992 Dependence on renal dialysis: Secondary | ICD-10-CM | POA: Diagnosis not present

## 2023-05-29 DIAGNOSIS — N186 End stage renal disease: Secondary | ICD-10-CM | POA: Diagnosis not present

## 2023-06-01 DIAGNOSIS — D631 Anemia in chronic kidney disease: Secondary | ICD-10-CM | POA: Diagnosis not present

## 2023-06-01 DIAGNOSIS — N250 Renal osteodystrophy: Secondary | ICD-10-CM | POA: Diagnosis not present

## 2023-06-01 DIAGNOSIS — N186 End stage renal disease: Secondary | ICD-10-CM | POA: Diagnosis not present

## 2023-06-01 DIAGNOSIS — D509 Iron deficiency anemia, unspecified: Secondary | ICD-10-CM | POA: Diagnosis not present

## 2023-06-01 DIAGNOSIS — Z992 Dependence on renal dialysis: Secondary | ICD-10-CM | POA: Diagnosis not present

## 2023-06-01 DIAGNOSIS — N2581 Secondary hyperparathyroidism of renal origin: Secondary | ICD-10-CM | POA: Diagnosis not present

## 2023-06-03 DIAGNOSIS — D509 Iron deficiency anemia, unspecified: Secondary | ICD-10-CM | POA: Diagnosis not present

## 2023-06-03 DIAGNOSIS — Z992 Dependence on renal dialysis: Secondary | ICD-10-CM | POA: Diagnosis not present

## 2023-06-03 DIAGNOSIS — N250 Renal osteodystrophy: Secondary | ICD-10-CM | POA: Diagnosis not present

## 2023-06-03 DIAGNOSIS — N2581 Secondary hyperparathyroidism of renal origin: Secondary | ICD-10-CM | POA: Diagnosis not present

## 2023-06-03 DIAGNOSIS — D631 Anemia in chronic kidney disease: Secondary | ICD-10-CM | POA: Diagnosis not present

## 2023-06-03 DIAGNOSIS — N186 End stage renal disease: Secondary | ICD-10-CM | POA: Diagnosis not present

## 2023-06-05 DIAGNOSIS — N250 Renal osteodystrophy: Secondary | ICD-10-CM | POA: Diagnosis not present

## 2023-06-05 DIAGNOSIS — D631 Anemia in chronic kidney disease: Secondary | ICD-10-CM | POA: Diagnosis not present

## 2023-06-05 DIAGNOSIS — D509 Iron deficiency anemia, unspecified: Secondary | ICD-10-CM | POA: Diagnosis not present

## 2023-06-05 DIAGNOSIS — N2581 Secondary hyperparathyroidism of renal origin: Secondary | ICD-10-CM | POA: Diagnosis not present

## 2023-06-05 DIAGNOSIS — N186 End stage renal disease: Secondary | ICD-10-CM | POA: Diagnosis not present

## 2023-06-05 DIAGNOSIS — Z992 Dependence on renal dialysis: Secondary | ICD-10-CM | POA: Diagnosis not present

## 2023-06-08 DIAGNOSIS — N250 Renal osteodystrophy: Secondary | ICD-10-CM | POA: Diagnosis not present

## 2023-06-08 DIAGNOSIS — Z992 Dependence on renal dialysis: Secondary | ICD-10-CM | POA: Diagnosis not present

## 2023-06-08 DIAGNOSIS — D631 Anemia in chronic kidney disease: Secondary | ICD-10-CM | POA: Diagnosis not present

## 2023-06-08 DIAGNOSIS — D509 Iron deficiency anemia, unspecified: Secondary | ICD-10-CM | POA: Diagnosis not present

## 2023-06-08 DIAGNOSIS — N186 End stage renal disease: Secondary | ICD-10-CM | POA: Diagnosis not present

## 2023-06-08 DIAGNOSIS — N2581 Secondary hyperparathyroidism of renal origin: Secondary | ICD-10-CM | POA: Diagnosis not present

## 2023-06-10 DIAGNOSIS — D509 Iron deficiency anemia, unspecified: Secondary | ICD-10-CM | POA: Diagnosis not present

## 2023-06-10 DIAGNOSIS — Z992 Dependence on renal dialysis: Secondary | ICD-10-CM | POA: Diagnosis not present

## 2023-06-10 DIAGNOSIS — D631 Anemia in chronic kidney disease: Secondary | ICD-10-CM | POA: Diagnosis not present

## 2023-06-10 DIAGNOSIS — N250 Renal osteodystrophy: Secondary | ICD-10-CM | POA: Diagnosis not present

## 2023-06-10 DIAGNOSIS — N186 End stage renal disease: Secondary | ICD-10-CM | POA: Diagnosis not present

## 2023-06-10 DIAGNOSIS — N2581 Secondary hyperparathyroidism of renal origin: Secondary | ICD-10-CM | POA: Diagnosis not present

## 2023-06-12 DIAGNOSIS — Z992 Dependence on renal dialysis: Secondary | ICD-10-CM | POA: Diagnosis not present

## 2023-06-12 DIAGNOSIS — D509 Iron deficiency anemia, unspecified: Secondary | ICD-10-CM | POA: Diagnosis not present

## 2023-06-12 DIAGNOSIS — N250 Renal osteodystrophy: Secondary | ICD-10-CM | POA: Diagnosis not present

## 2023-06-12 DIAGNOSIS — N2581 Secondary hyperparathyroidism of renal origin: Secondary | ICD-10-CM | POA: Diagnosis not present

## 2023-06-12 DIAGNOSIS — N186 End stage renal disease: Secondary | ICD-10-CM | POA: Diagnosis not present

## 2023-06-12 DIAGNOSIS — D631 Anemia in chronic kidney disease: Secondary | ICD-10-CM | POA: Diagnosis not present

## 2023-06-15 DIAGNOSIS — D509 Iron deficiency anemia, unspecified: Secondary | ICD-10-CM | POA: Diagnosis not present

## 2023-06-15 DIAGNOSIS — Z992 Dependence on renal dialysis: Secondary | ICD-10-CM | POA: Diagnosis not present

## 2023-06-15 DIAGNOSIS — N186 End stage renal disease: Secondary | ICD-10-CM | POA: Diagnosis not present

## 2023-06-15 DIAGNOSIS — N250 Renal osteodystrophy: Secondary | ICD-10-CM | POA: Diagnosis not present

## 2023-06-15 DIAGNOSIS — N2581 Secondary hyperparathyroidism of renal origin: Secondary | ICD-10-CM | POA: Diagnosis not present

## 2023-06-15 DIAGNOSIS — D631 Anemia in chronic kidney disease: Secondary | ICD-10-CM | POA: Diagnosis not present

## 2023-06-17 DIAGNOSIS — N186 End stage renal disease: Secondary | ICD-10-CM | POA: Diagnosis not present

## 2023-06-17 DIAGNOSIS — N250 Renal osteodystrophy: Secondary | ICD-10-CM | POA: Diagnosis not present

## 2023-06-17 DIAGNOSIS — Z992 Dependence on renal dialysis: Secondary | ICD-10-CM | POA: Diagnosis not present

## 2023-06-17 DIAGNOSIS — N2581 Secondary hyperparathyroidism of renal origin: Secondary | ICD-10-CM | POA: Diagnosis not present

## 2023-06-17 DIAGNOSIS — D631 Anemia in chronic kidney disease: Secondary | ICD-10-CM | POA: Diagnosis not present

## 2023-06-17 DIAGNOSIS — D509 Iron deficiency anemia, unspecified: Secondary | ICD-10-CM | POA: Diagnosis not present

## 2023-06-19 DIAGNOSIS — N2581 Secondary hyperparathyroidism of renal origin: Secondary | ICD-10-CM | POA: Diagnosis not present

## 2023-06-19 DIAGNOSIS — D509 Iron deficiency anemia, unspecified: Secondary | ICD-10-CM | POA: Diagnosis not present

## 2023-06-19 DIAGNOSIS — N186 End stage renal disease: Secondary | ICD-10-CM | POA: Diagnosis not present

## 2023-06-19 DIAGNOSIS — N250 Renal osteodystrophy: Secondary | ICD-10-CM | POA: Diagnosis not present

## 2023-06-19 DIAGNOSIS — D631 Anemia in chronic kidney disease: Secondary | ICD-10-CM | POA: Diagnosis not present

## 2023-06-19 DIAGNOSIS — Z992 Dependence on renal dialysis: Secondary | ICD-10-CM | POA: Diagnosis not present

## 2023-06-20 DIAGNOSIS — N186 End stage renal disease: Secondary | ICD-10-CM | POA: Diagnosis not present

## 2023-06-26 ENCOUNTER — Telehealth: Payer: Self-pay | Admitting: Nurse Practitioner

## 2023-06-26 DIAGNOSIS — K5900 Constipation, unspecified: Secondary | ICD-10-CM

## 2023-06-26 MED ORDER — LINACLOTIDE 72 MCG PO CAPS: 72.0000 ug | ORAL_CAPSULE | Freq: Every day | ORAL | 1 refills | Status: AC

## 2023-06-26 NOTE — Telephone Encounter (Signed)
Inbound call from [facility] pharmacy requesting refill for Linzess 72 Mcg.

## 2023-06-26 NOTE — Telephone Encounter (Signed)
Rx for Linzess sent to pharmacy as requested.  

## 2023-06-30 DIAGNOSIS — R112 Nausea with vomiting, unspecified: Secondary | ICD-10-CM | POA: Diagnosis not present

## 2023-06-30 DIAGNOSIS — K5900 Constipation, unspecified: Secondary | ICD-10-CM | POA: Diagnosis not present

## 2023-06-30 DIAGNOSIS — K219 Gastro-esophageal reflux disease without esophagitis: Secondary | ICD-10-CM | POA: Diagnosis not present

## 2023-06-30 DIAGNOSIS — N186 End stage renal disease: Secondary | ICD-10-CM | POA: Diagnosis not present

## 2023-07-21 ENCOUNTER — Ambulatory Visit: Payer: Medicare Other | Admitting: Family Medicine

## 2023-08-14 ENCOUNTER — Telehealth: Payer: Self-pay | Admitting: Cardiology

## 2023-08-14 NOTE — Telephone Encounter (Signed)
They are requesting info on her Watchman Device

## 2023-08-14 NOTE — Telephone Encounter (Signed)
Left voicemail to return call to office

## 2023-08-18 NOTE — Telephone Encounter (Signed)
Attempted to call  [facility] [phone]   No voicemail to leave message. Will try again later.

## 2023-08-18 NOTE — Telephone Encounter (Signed)
Spoke with Lelon Mast with [employer] in [place].  Watchman implant information is needed for MRI imaging.  Implant info faxed to: [phone]

## 2023-09-22 ENCOUNTER — Telehealth: Payer: Self-pay | Admitting: Cardiovascular Disease

## 2023-09-22 NOTE — Telephone Encounter (Signed)
 Spoke with daughter per DPR. Patient is in the hospital in [place]. The symptoms she describe they felt she had a stroke. Daughter states one provider stated it was problems with her watchman and will start her on plavix and a second provider informed them he do not believe its from the [place]. They wanted to do a MRI and needed to know was it safe to do MRI with her watchman. Informed daughter the Sharyne Peach is MRI compatible. She would like to know if they do have any questions can they give you a call?

## 2023-09-22 NOTE — Telephone Encounter (Signed)
 Pt daughter class in stating pt is in the hospital [place], [place] - [facility]. She states she was referred to Dr. Excell Seltzer in 2023 for Watchman.   She states right now they believe pt had a mini stroke but they want to make sure that it was. She states one doctor is saying they think its complications from Watchman and wants to put her back on Plavix and Aspirin and then another doctor is saying they do not think it's related to the Watchman. She states they also asked if the Watchman has metal in it?    Pt daughter expressed concern that she is worried they will "mess up something" and would like to see if Dr. Excell Seltzer can give some insight or advise.

## 2023-09-22 NOTE — Telephone Encounter (Signed)
 I spoke with the patient's daughter and gave her my contact information in case the physicians in [place] need to speak with me about anything.

## 2023-10-21 ENCOUNTER — Other Ambulatory Visit (HOSPITAL_COMMUNITY): Payer: Self-pay

## 2023-10-21 ENCOUNTER — Telehealth: Payer: Self-pay

## 2023-10-21 NOTE — Telephone Encounter (Signed)
 Pharmacy Patient Advocate Encounter   Received notification from Patient Pharmacy that prior authorization for Lansoprazole 30MG  dr capsules is required/requested.   Insurance verification completed.   The patient is insured through [employer] .   Prior Authorization for Lansoprazole 30MG  dr capsules has been APPROVED from 07-22-2023 to 07-20-2024   PA #/Case ID/Reference #: [id number]

## 2023-10-21 NOTE — Telephone Encounter (Signed)
[name] 

## 2023-11-09 NOTE — Telephone Encounter (Signed)
 Spoke with daughter and she will give them your cell number and have them call you

## 2023-11-09 NOTE — Telephone Encounter (Signed)
 Spoke with daughter and she states she was following up because her mom now has a cardiologist in [place].  Her new cardiologist would like to speak with Dr. Arlester Ladd.  She would like to know if you wanted to call them or have them call you

## 2023-11-09 NOTE — Telephone Encounter (Signed)
 Daughter Halford Levels) wants a call back to discuss further information regarding patient's heart Watchman.

## 2024-01-08 ENCOUNTER — Telehealth: Payer: Self-pay

## 2024-01-08 NOTE — Telephone Encounter (Addendum)
 Called to check in with patient, who had LAAO on 02/06/2022.  Left message to call back.

## 2024-01-08 NOTE — Telephone Encounter (Signed)
 Called to check in with patient, who had LAAO on 02/06/2022. The patient reports doing well. She has moved to [place].  The patient understands to call with questions or concerns.
